# Patient Record
Sex: Male | Born: 1955 | Race: Black or African American | Hispanic: No | Marital: Single | Smoking: Former smoker
Health system: Southern US, Community
[De-identification: ages and names within clinical notes are randomized; demographics above are authoritative.]

## PROBLEM LIST (undated history)

## (undated) DIAGNOSIS — F1411 Cocaine abuse, in remission: Secondary | ICD-10-CM

## (undated) DIAGNOSIS — Z8719 Personal history of other diseases of the digestive system: Secondary | ICD-10-CM

## (undated) DIAGNOSIS — I499 Cardiac arrhythmia, unspecified: Secondary | ICD-10-CM

## (undated) DIAGNOSIS — I428 Other cardiomyopathies: Secondary | ICD-10-CM

## (undated) DIAGNOSIS — M48061 Spinal stenosis, lumbar region without neurogenic claudication: Secondary | ICD-10-CM

## (undated) DIAGNOSIS — B1920 Unspecified viral hepatitis C without hepatic coma: Secondary | ICD-10-CM

## (undated) DIAGNOSIS — F250 Schizoaffective disorder, bipolar type: Secondary | ICD-10-CM

## (undated) DIAGNOSIS — M6282 Rhabdomyolysis: Secondary | ICD-10-CM

## (undated) DIAGNOSIS — F259 Schizoaffective disorder, unspecified: Secondary | ICD-10-CM

## (undated) DIAGNOSIS — Z8701 Personal history of pneumonia (recurrent): Secondary | ICD-10-CM

## (undated) DIAGNOSIS — I639 Cerebral infarction, unspecified: Secondary | ICD-10-CM

## (undated) DIAGNOSIS — D734 Cyst of spleen: Secondary | ICD-10-CM

## (undated) DIAGNOSIS — N182 Chronic kidney disease, stage 2 (mild): Secondary | ICD-10-CM

## (undated) DIAGNOSIS — E119 Type 2 diabetes mellitus without complications: Secondary | ICD-10-CM

## (undated) DIAGNOSIS — E785 Hyperlipidemia, unspecified: Secondary | ICD-10-CM

## (undated) DIAGNOSIS — I5042 Chronic combined systolic (congestive) and diastolic (congestive) heart failure: Secondary | ICD-10-CM

## (undated) DIAGNOSIS — I4891 Unspecified atrial fibrillation: Secondary | ICD-10-CM

## (undated) DIAGNOSIS — E781 Pure hyperglyceridemia: Secondary | ICD-10-CM

## (undated) DIAGNOSIS — Q211 Atrial septal defect: Secondary | ICD-10-CM

## (undated) DIAGNOSIS — F1020 Alcohol dependence, uncomplicated: Secondary | ICD-10-CM

## (undated) DIAGNOSIS — C801 Malignant (primary) neoplasm, unspecified: Secondary | ICD-10-CM

## (undated) DIAGNOSIS — K529 Noninfective gastroenteritis and colitis, unspecified: Secondary | ICD-10-CM

## (undated) DIAGNOSIS — K219 Gastro-esophageal reflux disease without esophagitis: Secondary | ICD-10-CM

## (undated) DIAGNOSIS — Z8489 Family history of other specified conditions: Secondary | ICD-10-CM

## (undated) DIAGNOSIS — K760 Fatty (change of) liver, not elsewhere classified: Secondary | ICD-10-CM

## (undated) DIAGNOSIS — I10 Essential (primary) hypertension: Secondary | ICD-10-CM

## (undated) DIAGNOSIS — D7582 Heparin induced thrombocytopenia (HIT): Secondary | ICD-10-CM

## (undated) DIAGNOSIS — D75829 Heparin-induced thrombocytopenia, unspecified: Secondary | ICD-10-CM

## (undated) DIAGNOSIS — IMO0001 Reserved for inherently not codable concepts without codable children: Secondary | ICD-10-CM

## (undated) HISTORY — PX: KNEE ARTHROSCOPY: SHX127

## (undated) HISTORY — PX: CATARACT EXTRACTION W/ INTRAOCULAR LENS IMPLANT: SHX1309

## (undated) HISTORY — PX: LUMBAR LAMINECTOMY: SHX95

## (undated) HISTORY — DX: Other cardiomyopathies: I42.8

## (undated) HISTORY — PX: TONSILLECTOMY: SUR1361

## (undated) HISTORY — PX: BACK SURGERY: SHX140

## (undated) HISTORY — DX: Hyperlipidemia, unspecified: E78.5

---

## 1997-05-01 ENCOUNTER — Emergency Department (HOSPITAL_COMMUNITY): Admission: EM | Admit: 1997-05-01 | Discharge: 1997-05-01 | Payer: Self-pay | Admitting: Emergency Medicine

## 1997-05-12 ENCOUNTER — Emergency Department (HOSPITAL_COMMUNITY): Admission: EM | Admit: 1997-05-12 | Discharge: 1997-05-12 | Payer: Self-pay | Admitting: Emergency Medicine

## 1997-06-22 ENCOUNTER — Emergency Department (HOSPITAL_COMMUNITY): Admission: EM | Admit: 1997-06-22 | Discharge: 1997-06-22 | Payer: Self-pay | Admitting: Emergency Medicine

## 1997-06-28 ENCOUNTER — Emergency Department (HOSPITAL_COMMUNITY): Admission: EM | Admit: 1997-06-28 | Discharge: 1997-06-28 | Payer: Self-pay | Admitting: Emergency Medicine

## 1997-07-03 ENCOUNTER — Emergency Department (HOSPITAL_COMMUNITY): Admission: EM | Admit: 1997-07-03 | Discharge: 1997-07-03 | Payer: Self-pay | Admitting: Emergency Medicine

## 1997-07-16 ENCOUNTER — Emergency Department (HOSPITAL_COMMUNITY): Admission: EM | Admit: 1997-07-16 | Discharge: 1997-07-16 | Payer: Self-pay | Admitting: Emergency Medicine

## 1997-08-04 ENCOUNTER — Emergency Department (HOSPITAL_COMMUNITY): Admission: EM | Admit: 1997-08-04 | Discharge: 1997-08-04 | Payer: Self-pay | Admitting: Emergency Medicine

## 1998-09-07 ENCOUNTER — Encounter: Payer: Self-pay | Admitting: Emergency Medicine

## 1998-09-07 ENCOUNTER — Emergency Department (HOSPITAL_COMMUNITY): Admission: EM | Admit: 1998-09-07 | Discharge: 1998-09-07 | Payer: Self-pay | Admitting: Emergency Medicine

## 1998-10-01 ENCOUNTER — Emergency Department (HOSPITAL_COMMUNITY): Admission: EM | Admit: 1998-10-01 | Discharge: 1998-10-01 | Payer: Self-pay | Admitting: Emergency Medicine

## 1998-11-20 ENCOUNTER — Emergency Department (HOSPITAL_COMMUNITY): Admission: EM | Admit: 1998-11-20 | Discharge: 1998-11-20 | Payer: Self-pay | Admitting: Emergency Medicine

## 2000-09-25 ENCOUNTER — Emergency Department (HOSPITAL_COMMUNITY): Admission: EM | Admit: 2000-09-25 | Discharge: 2000-09-26 | Payer: Self-pay | Admitting: Emergency Medicine

## 2000-10-02 ENCOUNTER — Emergency Department (HOSPITAL_COMMUNITY): Admission: EM | Admit: 2000-10-02 | Discharge: 2000-10-02 | Payer: Self-pay | Admitting: Emergency Medicine

## 2000-10-03 ENCOUNTER — Encounter: Payer: Self-pay | Admitting: Emergency Medicine

## 2001-01-28 ENCOUNTER — Emergency Department (HOSPITAL_COMMUNITY): Admission: EM | Admit: 2001-01-28 | Discharge: 2001-01-28 | Payer: Self-pay | Admitting: Emergency Medicine

## 2001-01-29 ENCOUNTER — Emergency Department (HOSPITAL_COMMUNITY): Admission: EM | Admit: 2001-01-29 | Discharge: 2001-01-29 | Payer: Self-pay | Admitting: Emergency Medicine

## 2001-01-30 ENCOUNTER — Encounter: Payer: Self-pay | Admitting: *Deleted

## 2001-01-30 ENCOUNTER — Emergency Department (HOSPITAL_COMMUNITY): Admission: AC | Admit: 2001-01-30 | Discharge: 2001-01-30 | Payer: Self-pay

## 2001-02-05 ENCOUNTER — Emergency Department (HOSPITAL_COMMUNITY): Admission: EM | Admit: 2001-02-05 | Discharge: 2001-02-05 | Payer: Self-pay | Admitting: Emergency Medicine

## 2001-04-15 ENCOUNTER — Emergency Department (HOSPITAL_COMMUNITY): Admission: EM | Admit: 2001-04-15 | Discharge: 2001-04-15 | Payer: Self-pay | Admitting: Emergency Medicine

## 2001-04-17 ENCOUNTER — Emergency Department (HOSPITAL_COMMUNITY): Admission: EM | Admit: 2001-04-17 | Discharge: 2001-04-18 | Payer: Self-pay | Admitting: Emergency Medicine

## 2001-04-17 ENCOUNTER — Emergency Department (HOSPITAL_COMMUNITY): Admission: EM | Admit: 2001-04-17 | Discharge: 2001-04-17 | Payer: Self-pay | Admitting: *Deleted

## 2001-04-18 ENCOUNTER — Encounter: Payer: Self-pay | Admitting: Emergency Medicine

## 2001-06-16 ENCOUNTER — Emergency Department (HOSPITAL_COMMUNITY): Admission: EM | Admit: 2001-06-16 | Discharge: 2001-06-16 | Payer: Self-pay

## 2001-06-24 ENCOUNTER — Emergency Department (HOSPITAL_COMMUNITY): Admission: EM | Admit: 2001-06-24 | Discharge: 2001-06-24 | Payer: Self-pay | Admitting: Emergency Medicine

## 2002-01-02 ENCOUNTER — Encounter: Payer: Self-pay | Admitting: Emergency Medicine

## 2002-01-02 ENCOUNTER — Emergency Department (HOSPITAL_COMMUNITY): Admission: EM | Admit: 2002-01-02 | Discharge: 2002-01-03 | Payer: Self-pay | Admitting: Emergency Medicine

## 2002-01-03 ENCOUNTER — Emergency Department (HOSPITAL_COMMUNITY): Admission: EM | Admit: 2002-01-03 | Discharge: 2002-01-03 | Payer: Self-pay | Admitting: Emergency Medicine

## 2002-01-16 ENCOUNTER — Emergency Department (HOSPITAL_COMMUNITY): Admission: EM | Admit: 2002-01-16 | Discharge: 2002-01-16 | Payer: Self-pay | Admitting: Emergency Medicine

## 2002-01-22 ENCOUNTER — Emergency Department (HOSPITAL_COMMUNITY): Admission: EM | Admit: 2002-01-22 | Discharge: 2002-01-22 | Payer: Self-pay | Admitting: Emergency Medicine

## 2003-11-25 ENCOUNTER — Emergency Department (HOSPITAL_COMMUNITY): Admission: EM | Admit: 2003-11-25 | Discharge: 2003-11-25 | Payer: Self-pay | Admitting: Emergency Medicine

## 2005-01-26 HISTORY — PX: ACHILLES TENDON REPAIR: SUR1153

## 2005-07-22 ENCOUNTER — Emergency Department (HOSPITAL_COMMUNITY): Admission: EM | Admit: 2005-07-22 | Discharge: 2005-07-22 | Payer: Self-pay | Admitting: Emergency Medicine

## 2005-07-23 ENCOUNTER — Emergency Department (HOSPITAL_COMMUNITY): Admission: EM | Admit: 2005-07-23 | Discharge: 2005-07-23 | Payer: Self-pay | Admitting: Emergency Medicine

## 2005-08-05 ENCOUNTER — Emergency Department (HOSPITAL_COMMUNITY): Admission: EM | Admit: 2005-08-05 | Discharge: 2005-08-05 | Payer: Self-pay | Admitting: Emergency Medicine

## 2005-08-24 ENCOUNTER — Emergency Department (HOSPITAL_COMMUNITY): Admission: EM | Admit: 2005-08-24 | Discharge: 2005-08-24 | Payer: Self-pay | Admitting: Emergency Medicine

## 2005-08-29 ENCOUNTER — Emergency Department (HOSPITAL_COMMUNITY): Admission: EM | Admit: 2005-08-29 | Discharge: 2005-08-30 | Payer: Self-pay | Admitting: Emergency Medicine

## 2005-09-01 ENCOUNTER — Ambulatory Visit (HOSPITAL_COMMUNITY): Admission: RE | Admit: 2005-09-01 | Discharge: 2005-09-01 | Payer: Self-pay | Admitting: Emergency Medicine

## 2005-09-03 ENCOUNTER — Encounter (INDEPENDENT_AMBULATORY_CARE_PROVIDER_SITE_OTHER): Payer: Self-pay | Admitting: Cardiology

## 2005-09-03 ENCOUNTER — Inpatient Hospital Stay (HOSPITAL_COMMUNITY): Admission: EM | Admit: 2005-09-03 | Discharge: 2005-09-06 | Payer: Self-pay | Admitting: Emergency Medicine

## 2005-09-21 ENCOUNTER — Encounter: Admission: RE | Admit: 2005-09-21 | Discharge: 2005-09-21 | Payer: Self-pay | Admitting: Orthopedic Surgery

## 2005-09-23 ENCOUNTER — Ambulatory Visit (HOSPITAL_BASED_OUTPATIENT_CLINIC_OR_DEPARTMENT_OTHER): Admission: RE | Admit: 2005-09-23 | Discharge: 2005-09-23 | Payer: Self-pay | Admitting: Orthopedic Surgery

## 2005-12-21 ENCOUNTER — Emergency Department (HOSPITAL_COMMUNITY): Admission: EM | Admit: 2005-12-21 | Discharge: 2005-12-21 | Payer: Self-pay | Admitting: Emergency Medicine

## 2006-01-06 ENCOUNTER — Ambulatory Visit: Payer: Self-pay | Admitting: Nurse Practitioner

## 2006-02-10 ENCOUNTER — Emergency Department (HOSPITAL_COMMUNITY): Admission: EM | Admit: 2006-02-10 | Discharge: 2006-02-10 | Payer: Self-pay | Admitting: Emergency Medicine

## 2006-04-12 ENCOUNTER — Ambulatory Visit: Payer: Self-pay | Admitting: Internal Medicine

## 2006-08-01 ENCOUNTER — Emergency Department (HOSPITAL_COMMUNITY): Admission: EM | Admit: 2006-08-01 | Discharge: 2006-08-01 | Payer: Self-pay | Admitting: Emergency Medicine

## 2006-09-03 ENCOUNTER — Emergency Department (HOSPITAL_COMMUNITY): Admission: EM | Admit: 2006-09-03 | Discharge: 2006-09-03 | Payer: Self-pay | Admitting: Emergency Medicine

## 2006-10-12 ENCOUNTER — Telehealth (INDEPENDENT_AMBULATORY_CARE_PROVIDER_SITE_OTHER): Payer: Self-pay | Admitting: Internal Medicine

## 2006-10-15 ENCOUNTER — Emergency Department (HOSPITAL_COMMUNITY): Admission: EM | Admit: 2006-10-15 | Discharge: 2006-10-15 | Payer: Self-pay | Admitting: Emergency Medicine

## 2006-10-18 DIAGNOSIS — F209 Schizophrenia, unspecified: Secondary | ICD-10-CM | POA: Insufficient documentation

## 2006-10-18 DIAGNOSIS — K219 Gastro-esophageal reflux disease without esophagitis: Secondary | ICD-10-CM

## 2006-12-10 ENCOUNTER — Emergency Department (HOSPITAL_COMMUNITY): Admission: EM | Admit: 2006-12-10 | Discharge: 2006-12-10 | Payer: Self-pay | Admitting: Emergency Medicine

## 2007-01-29 ENCOUNTER — Emergency Department (HOSPITAL_COMMUNITY): Admission: EM | Admit: 2007-01-29 | Discharge: 2007-01-29 | Payer: Self-pay | Admitting: Emergency Medicine

## 2007-01-31 ENCOUNTER — Emergency Department (HOSPITAL_COMMUNITY): Admission: EM | Admit: 2007-01-31 | Discharge: 2007-01-31 | Payer: Self-pay | Admitting: Emergency Medicine

## 2007-02-03 ENCOUNTER — Emergency Department (HOSPITAL_COMMUNITY): Admission: EM | Admit: 2007-02-03 | Discharge: 2007-02-03 | Payer: Self-pay | Admitting: Emergency Medicine

## 2007-02-22 ENCOUNTER — Ambulatory Visit: Payer: Self-pay | Admitting: Nurse Practitioner

## 2007-02-22 DIAGNOSIS — M19019 Primary osteoarthritis, unspecified shoulder: Secondary | ICD-10-CM | POA: Insufficient documentation

## 2007-03-08 ENCOUNTER — Ambulatory Visit: Payer: Self-pay | Admitting: Nurse Practitioner

## 2007-03-08 DIAGNOSIS — G4700 Insomnia, unspecified: Secondary | ICD-10-CM | POA: Insufficient documentation

## 2007-05-04 ENCOUNTER — Emergency Department (HOSPITAL_COMMUNITY): Admission: EM | Admit: 2007-05-04 | Discharge: 2007-05-04 | Payer: Self-pay | Admitting: Emergency Medicine

## 2007-05-06 ENCOUNTER — Inpatient Hospital Stay (HOSPITAL_COMMUNITY): Admission: EM | Admit: 2007-05-06 | Discharge: 2007-05-10 | Payer: Self-pay | Admitting: Emergency Medicine

## 2007-05-06 ENCOUNTER — Ambulatory Visit: Payer: Self-pay | Admitting: Infectious Diseases

## 2007-05-06 DIAGNOSIS — B1710 Acute hepatitis C without hepatic coma: Secondary | ICD-10-CM

## 2007-05-06 DIAGNOSIS — E785 Hyperlipidemia, unspecified: Secondary | ICD-10-CM

## 2007-05-06 DIAGNOSIS — F1910 Other psychoactive substance abuse, uncomplicated: Secondary | ICD-10-CM

## 2007-05-06 DIAGNOSIS — D7582 Heparin induced thrombocytopenia (HIT): Secondary | ICD-10-CM

## 2007-05-06 DIAGNOSIS — Z8719 Personal history of other diseases of the digestive system: Secondary | ICD-10-CM | POA: Insufficient documentation

## 2007-05-06 LAB — CONVERTED CEMR LAB
Cholesterol: 255 mg/dL
HDL: 45 mg/dL

## 2007-05-17 ENCOUNTER — Ambulatory Visit: Payer: Self-pay | Admitting: Nurse Practitioner

## 2007-05-18 ENCOUNTER — Encounter (INDEPENDENT_AMBULATORY_CARE_PROVIDER_SITE_OTHER): Payer: Self-pay | Admitting: Nurse Practitioner

## 2007-05-18 LAB — CONVERTED CEMR LAB
ALT: 50 units/L (ref 0–53)
AST: 27 units/L (ref 0–37)
Alkaline Phosphatase: 71 units/L (ref 39–117)
BUN: 15 mg/dL (ref 6–23)
Basophils Absolute: 0 10*3/uL (ref 0.0–0.1)
Creatinine, Ser: 1.07 mg/dL (ref 0.40–1.50)
Eosinophils Absolute: 0.1 10*3/uL (ref 0.0–0.7)
Eosinophils Relative: 1 % (ref 0–5)
HCT: 42.2 % (ref 39.0–52.0)
INR: 1 (ref 0.0–1.5)
MCV: 101 fL — ABNORMAL HIGH (ref 78.0–100.0)
Neutrophils Relative %: 74 % (ref 43–77)
Platelets: 416 10*3/uL — ABNORMAL HIGH (ref 150–400)
Prothrombin Time: 13.1 s (ref 11.6–15.2)
RDW: 14.3 % (ref 11.5–15.5)
Total Bilirubin: 0.3 mg/dL (ref 0.3–1.2)
WBC: 7.9 10*3/uL (ref 4.0–10.5)

## 2007-05-19 ENCOUNTER — Telehealth (INDEPENDENT_AMBULATORY_CARE_PROVIDER_SITE_OTHER): Payer: Self-pay | Admitting: Nurse Practitioner

## 2007-06-15 ENCOUNTER — Telehealth (INDEPENDENT_AMBULATORY_CARE_PROVIDER_SITE_OTHER): Payer: Self-pay | Admitting: Nurse Practitioner

## 2007-07-05 ENCOUNTER — Emergency Department (HOSPITAL_COMMUNITY): Admission: EM | Admit: 2007-07-05 | Discharge: 2007-07-05 | Payer: Self-pay | Admitting: Emergency Medicine

## 2007-07-21 ENCOUNTER — Emergency Department (HOSPITAL_COMMUNITY): Admission: EM | Admit: 2007-07-21 | Discharge: 2007-07-21 | Payer: Self-pay | Admitting: Emergency Medicine

## 2007-09-22 ENCOUNTER — Encounter (INDEPENDENT_AMBULATORY_CARE_PROVIDER_SITE_OTHER): Payer: Self-pay | Admitting: *Deleted

## 2007-10-02 ENCOUNTER — Emergency Department (HOSPITAL_COMMUNITY): Admission: EM | Admit: 2007-10-02 | Discharge: 2007-10-03 | Payer: Self-pay | Admitting: *Deleted

## 2007-10-26 DIAGNOSIS — M109 Gout, unspecified: Secondary | ICD-10-CM | POA: Insufficient documentation

## 2007-11-17 ENCOUNTER — Encounter (INDEPENDENT_AMBULATORY_CARE_PROVIDER_SITE_OTHER): Payer: Self-pay | Admitting: Nurse Practitioner

## 2007-12-15 ENCOUNTER — Emergency Department (HOSPITAL_COMMUNITY): Admission: EM | Admit: 2007-12-15 | Discharge: 2007-12-15 | Payer: Self-pay | Admitting: Emergency Medicine

## 2007-12-27 ENCOUNTER — Emergency Department (HOSPITAL_COMMUNITY): Admission: EM | Admit: 2007-12-27 | Discharge: 2007-12-27 | Payer: Self-pay | Admitting: Emergency Medicine

## 2008-01-21 ENCOUNTER — Emergency Department (HOSPITAL_COMMUNITY): Admission: EM | Admit: 2008-01-21 | Discharge: 2008-01-22 | Payer: Self-pay | Admitting: Emergency Medicine

## 2008-02-08 ENCOUNTER — Emergency Department (HOSPITAL_COMMUNITY): Admission: EM | Admit: 2008-02-08 | Discharge: 2008-02-08 | Payer: Self-pay | Admitting: Family Medicine

## 2008-02-22 ENCOUNTER — Emergency Department (HOSPITAL_COMMUNITY): Admission: EM | Admit: 2008-02-22 | Discharge: 2008-02-22 | Payer: Self-pay | Admitting: Emergency Medicine

## 2008-05-14 ENCOUNTER — Emergency Department (HOSPITAL_COMMUNITY): Admission: EM | Admit: 2008-05-14 | Discharge: 2008-05-15 | Payer: Self-pay | Admitting: Emergency Medicine

## 2008-05-15 ENCOUNTER — Observation Stay (HOSPITAL_COMMUNITY): Admission: RE | Admit: 2008-05-15 | Discharge: 2008-05-18 | Payer: Self-pay | Admitting: Psychiatry

## 2008-05-15 ENCOUNTER — Ambulatory Visit: Payer: Self-pay | Admitting: Psychiatry

## 2008-07-03 ENCOUNTER — Encounter (INDEPENDENT_AMBULATORY_CARE_PROVIDER_SITE_OTHER): Payer: Self-pay | Admitting: Internal Medicine

## 2008-08-01 ENCOUNTER — Encounter (INDEPENDENT_AMBULATORY_CARE_PROVIDER_SITE_OTHER): Payer: Self-pay | Admitting: Nurse Practitioner

## 2008-08-03 ENCOUNTER — Encounter (INDEPENDENT_AMBULATORY_CARE_PROVIDER_SITE_OTHER): Payer: Self-pay | Admitting: Nurse Practitioner

## 2008-09-04 ENCOUNTER — Encounter: Admission: RE | Admit: 2008-09-04 | Discharge: 2008-09-04 | Payer: Self-pay | Admitting: Family Medicine

## 2009-01-22 ENCOUNTER — Emergency Department (HOSPITAL_COMMUNITY): Admission: EM | Admit: 2009-01-22 | Discharge: 2009-01-22 | Payer: Self-pay | Admitting: Emergency Medicine

## 2009-01-31 ENCOUNTER — Emergency Department (HOSPITAL_COMMUNITY): Admission: EM | Admit: 2009-01-31 | Discharge: 2009-01-31 | Payer: Self-pay | Admitting: Emergency Medicine

## 2009-02-13 ENCOUNTER — Encounter: Admission: RE | Admit: 2009-02-13 | Discharge: 2009-02-13 | Payer: Self-pay | Admitting: Family Medicine

## 2009-05-26 DIAGNOSIS — K529 Noninfective gastroenteritis and colitis, unspecified: Secondary | ICD-10-CM

## 2009-05-26 HISTORY — DX: Noninfective gastroenteritis and colitis, unspecified: K52.9

## 2009-06-01 ENCOUNTER — Emergency Department (HOSPITAL_COMMUNITY): Admission: EM | Admit: 2009-06-01 | Discharge: 2009-06-01 | Payer: Self-pay | Admitting: Emergency Medicine

## 2009-06-24 ENCOUNTER — Emergency Department (HOSPITAL_COMMUNITY): Admission: EM | Admit: 2009-06-24 | Discharge: 2009-06-25 | Payer: Self-pay | Admitting: Emergency Medicine

## 2009-08-15 ENCOUNTER — Emergency Department (HOSPITAL_COMMUNITY): Admission: EM | Admit: 2009-08-15 | Discharge: 2009-08-15 | Payer: Self-pay | Admitting: Emergency Medicine

## 2009-10-08 ENCOUNTER — Emergency Department (HOSPITAL_COMMUNITY): Admission: EM | Admit: 2009-10-08 | Discharge: 2009-10-09 | Payer: Self-pay | Admitting: Emergency Medicine

## 2009-12-17 ENCOUNTER — Ambulatory Visit (HOSPITAL_COMMUNITY): Admission: RE | Admit: 2009-12-17 | Discharge: 2009-12-17 | Payer: Self-pay | Admitting: Family Medicine

## 2010-01-15 ENCOUNTER — Emergency Department (HOSPITAL_COMMUNITY)
Admission: EM | Admit: 2010-01-15 | Discharge: 2010-01-15 | Payer: Self-pay | Source: Home / Self Care | Admitting: Emergency Medicine

## 2010-01-20 ENCOUNTER — Emergency Department (HOSPITAL_COMMUNITY)
Admission: EM | Admit: 2010-01-20 | Discharge: 2010-01-20 | Payer: Self-pay | Source: Home / Self Care | Admitting: Emergency Medicine

## 2010-02-13 ENCOUNTER — Inpatient Hospital Stay: Admission: RE | Admit: 2010-02-13 | Payer: Self-pay | Source: Home / Self Care | Admitting: Orthopedic Surgery

## 2010-02-21 ENCOUNTER — Encounter
Admission: RE | Admit: 2010-02-21 | Discharge: 2010-02-21 | Payer: Self-pay | Source: Home / Self Care | Attending: Family Medicine | Admitting: Family Medicine

## 2010-04-01 ENCOUNTER — Encounter (HOSPITAL_COMMUNITY): Payer: Self-pay

## 2010-04-01 ENCOUNTER — Emergency Department (HOSPITAL_COMMUNITY): Payer: PRIVATE HEALTH INSURANCE

## 2010-04-01 ENCOUNTER — Emergency Department (HOSPITAL_COMMUNITY)
Admission: EM | Admit: 2010-04-01 | Discharge: 2010-04-01 | Disposition: A | Discharge status: Home / Self Care | Payer: PRIVATE HEALTH INSURANCE | Attending: Emergency Medicine | Admitting: Emergency Medicine

## 2010-04-01 DIAGNOSIS — K219 Gastro-esophageal reflux disease without esophagitis: Secondary | ICD-10-CM | POA: Insufficient documentation

## 2010-04-01 DIAGNOSIS — R079 Chest pain, unspecified: Secondary | ICD-10-CM | POA: Insufficient documentation

## 2010-04-01 DIAGNOSIS — I10 Essential (primary) hypertension: Secondary | ICD-10-CM | POA: Insufficient documentation

## 2010-04-01 DIAGNOSIS — R109 Unspecified abdominal pain: Secondary | ICD-10-CM | POA: Insufficient documentation

## 2010-04-01 DIAGNOSIS — Z8659 Personal history of other mental and behavioral disorders: Secondary | ICD-10-CM | POA: Insufficient documentation

## 2010-04-01 DIAGNOSIS — Z79899 Other long term (current) drug therapy: Secondary | ICD-10-CM | POA: Insufficient documentation

## 2010-04-01 DIAGNOSIS — R630 Anorexia: Secondary | ICD-10-CM | POA: Insufficient documentation

## 2010-04-01 DIAGNOSIS — M546 Pain in thoracic spine: Secondary | ICD-10-CM | POA: Insufficient documentation

## 2010-04-01 HISTORY — DX: Noninfective gastroenteritis and colitis, unspecified: K52.9

## 2010-04-01 HISTORY — DX: Schizoaffective disorder, bipolar type: F25.0

## 2010-04-01 HISTORY — DX: Schizoaffective disorder, unspecified: F25.9

## 2010-04-01 HISTORY — DX: Essential (primary) hypertension: I10

## 2010-04-01 HISTORY — DX: Gastro-esophageal reflux disease without esophagitis: K21.9

## 2010-04-01 LAB — COMPREHENSIVE METABOLIC PANEL
ALT: 90 U/L — ABNORMAL HIGH (ref 0–53)
AST: 146 U/L — ABNORMAL HIGH (ref 0–37)
CO2: 22 mEq/L (ref 19–32)
Calcium: 8.4 mg/dL (ref 8.4–10.5)
GFR calc Af Amer: >60 mL/min (ref >60)
GFR calc non Af Amer: >60 mL/min (ref >60)
Sodium: 134 mEq/L — ABNORMAL LOW (ref 135–145)

## 2010-04-01 LAB — POCT CARDIAC MARKERS
CKMB, poc: 2.6 ng/mL (ref 1.0–8.0)
Myoglobin, poc: 54.9 ng/mL (ref 12–200)
Troponin i, poc: <0.05 ng/mL (ref 0.00–0.09)

## 2010-04-01 LAB — URINALYSIS, ROUTINE W REFLEX MICROSCOPIC
Bilirubin Urine: NEGATIVE
Ketones, ur: NEGATIVE mg/dL
Nitrite: NEGATIVE
Protein, ur: 30 mg/dL — AB
pH: 5.5 (ref 5.0–8.0)

## 2010-04-01 LAB — COMPREHENSIVE METABOLIC PANEL WITH GFR
Albumin: 3.4 g/dL — ABNORMAL LOW (ref 3.5–5.2)
Alkaline Phosphatase: 100 U/L (ref 39–117)
BUN: 11 mg/dL (ref 6–23)
Chloride: 105 meq/L (ref 96–112)
Creatinine, Ser: 1.01 mg/dL (ref 0.4–1.5)
Glucose, Bld: 118 mg/dL — ABNORMAL HIGH (ref 70–99)
Potassium: 5 meq/L (ref 3.5–5.1)
Total Bilirubin: 2.1 mg/dL — ABNORMAL HIGH (ref 0.3–1.2)
Total Protein: 6.4 g/dL (ref 6.0–8.3)

## 2010-04-01 LAB — DIFFERENTIAL
Basophils Absolute: 0 10*3/uL (ref 0.0–0.1)
Basophils Relative: 0 % (ref 0–1)
Monocytes Absolute: 0.3 10*3/uL (ref 0.1–1.0)
Neutro Abs: 4.3 10*3/uL (ref 1.7–7.7)
Neutrophils Relative %: 78 % — ABNORMAL HIGH (ref 43–77)

## 2010-04-01 LAB — URINE MICROSCOPIC-ADD ON

## 2010-04-01 LAB — CBC
Hemoglobin: 14.2 g/dL (ref 13.0–17.0)
MCHC: 32.5 g/dL (ref 30.0–36.0)
RBC: 4.84 MIL/uL (ref 4.22–5.81)
WBC: 5.5 10*3/uL (ref 4.0–10.5)

## 2010-04-01 LAB — LIPASE, BLOOD: Lipase: 61 U/L — ABNORMAL HIGH (ref 11–59)

## 2010-04-01 MED ORDER — IOHEXOL 300 MG/ML  SOLN
100.0000 mL | Freq: Once | INTRAMUSCULAR | Status: AC | PRN
  Administered 2010-04-01: 100 mL via INTRAVENOUS

## 2010-04-02 ENCOUNTER — Other Ambulatory Visit (HOSPITAL_COMMUNITY): Payer: Self-pay | Admitting: Interventional Cardiology

## 2010-04-02 DIAGNOSIS — R0989 Other specified symptoms and signs involving the circulatory and respiratory systems: Secondary | ICD-10-CM

## 2010-04-02 DIAGNOSIS — R9431 Abnormal electrocardiogram [ECG] [EKG]: Secondary | ICD-10-CM

## 2010-04-02 DIAGNOSIS — R0609 Other forms of dyspnea: Secondary | ICD-10-CM

## 2010-04-02 DIAGNOSIS — Z01810 Encounter for preprocedural cardiovascular examination: Secondary | ICD-10-CM

## 2010-04-07 ENCOUNTER — Ambulatory Visit (HOSPITAL_COMMUNITY): Payer: Medicare Other

## 2010-04-07 ENCOUNTER — Ambulatory Visit (HOSPITAL_COMMUNITY)
Admission: RE | Admit: 2010-04-07 | Discharge: 2010-04-07 | Disposition: A | Discharge status: Home / Self Care | Payer: PRIVATE HEALTH INSURANCE | Source: Ambulatory Visit | Attending: Interventional Cardiology | Admitting: Interventional Cardiology

## 2010-04-07 DIAGNOSIS — R0609 Other forms of dyspnea: Secondary | ICD-10-CM

## 2010-04-07 DIAGNOSIS — Z01810 Encounter for preprocedural cardiovascular examination: Secondary | ICD-10-CM

## 2010-04-07 DIAGNOSIS — R079 Chest pain, unspecified: Secondary | ICD-10-CM | POA: Insufficient documentation

## 2010-04-07 DIAGNOSIS — I10 Essential (primary) hypertension: Secondary | ICD-10-CM | POA: Insufficient documentation

## 2010-04-07 DIAGNOSIS — R0602 Shortness of breath: Secondary | ICD-10-CM | POA: Insufficient documentation

## 2010-04-07 DIAGNOSIS — R9431 Abnormal electrocardiogram [ECG] [EKG]: Secondary | ICD-10-CM

## 2010-04-07 DIAGNOSIS — R0989 Other specified symptoms and signs involving the circulatory and respiratory systems: Secondary | ICD-10-CM

## 2010-04-07 LAB — URINALYSIS, ROUTINE W REFLEX MICROSCOPIC
Bilirubin Urine: NEGATIVE
Hgb urine dipstick: NEGATIVE
Specific Gravity, Urine: 1.03 (ref 1.005–1.030)
Urobilinogen, UA: 1 mg/dL (ref 0.0–1.0)
pH: 5.5 (ref 5.0–8.0)

## 2010-04-07 LAB — URINE MICROSCOPIC-ADD ON

## 2010-04-07 LAB — COMPREHENSIVE METABOLIC PANEL
ALT: 39 U/L (ref 0–53)
Albumin: 3.8 g/dL (ref 3.5–5.2)
Alkaline Phosphatase: 86 U/L (ref 39–117)
GFR calc Af Amer: >60 mL/min (ref >60)
Potassium: 3.9 mEq/L (ref 3.5–5.1)
Sodium: 136 mEq/L (ref 135–145)
Total Protein: 7.4 g/dL (ref 6.0–8.3)

## 2010-04-07 LAB — DIFFERENTIAL
Basophils Relative: 0 % (ref 0–1)
Eosinophils Absolute: 0.1 10*3/uL (ref 0.0–0.7)
Lymphs Abs: 0.7 10*3/uL (ref 0.7–4.0)
Monocytes Absolute: 0.8 10*3/uL (ref 0.1–1.0)
Monocytes Relative: 8 % (ref 3–12)

## 2010-04-07 LAB — CBC
Platelets: 165 10*3/uL (ref 150–400)
RDW: 13.1 % (ref 11.5–15.5)
WBC: 10.1 10*3/uL (ref 4.0–10.5)

## 2010-04-07 MED ORDER — TECHNETIUM TC 99M TETROFOSMIN IV KIT
30.0000 | PACK | Freq: Once | INTRAVENOUS | Status: AC | PRN
  Administered 2010-04-07: 30 via INTRAVENOUS

## 2010-04-07 MED ORDER — TECHNETIUM TC 99M TETROFOSMIN IV KIT
10.0000 | PACK | Freq: Once | INTRAVENOUS | Status: AC | PRN
  Administered 2010-04-07: 10 via INTRAVENOUS

## 2010-04-08 LAB — CBC
HCT: 46.4 % (ref 39.0–52.0)
MCH: 30.2 pg (ref 26.0–34.0)
MCV: 89.9 fL (ref 78.0–100.0)
Platelets: 205 10*3/uL (ref 150–400)
RBC: 5.16 MIL/uL (ref 4.22–5.81)

## 2010-04-08 LAB — LIPID PANEL
Cholesterol: 187 mg/dL (ref 0–200)
LDL Cholesterol: 87 mg/dL (ref 0–99)
Triglycerides: 293 mg/dL — ABNORMAL HIGH (ref <150)
VLDL: 59 mg/dL — ABNORMAL HIGH (ref 0–40)

## 2010-04-08 LAB — COMPREHENSIVE METABOLIC PANEL
Albumin: 3.9 g/dL (ref 3.5–5.2)
Alkaline Phosphatase: 80 U/L (ref 39–117)
BUN: 9 mg/dL (ref 6–23)
Chloride: 106 mEq/L (ref 96–112)
Creatinine, Ser: 1.07 mg/dL (ref 0.4–1.5)
GFR calc non Af Amer: >60 mL/min (ref >60)
Glucose, Bld: 109 mg/dL — ABNORMAL HIGH (ref 70–99)
Total Bilirubin: 0.7 mg/dL (ref 0.3–1.2)

## 2010-04-12 LAB — COMPREHENSIVE METABOLIC PANEL
ALT: 190 U/L — ABNORMAL HIGH (ref 0–53)
Alkaline Phosphatase: 117 U/L (ref 39–117)
BUN: 10 mg/dL (ref 6–23)
CO2: 21 mEq/L (ref 19–32)
Chloride: 107 mEq/L (ref 96–112)
Glucose, Bld: 105 mg/dL — ABNORMAL HIGH (ref 70–99)
Potassium: 3.4 mEq/L — ABNORMAL LOW (ref 3.5–5.1)
Sodium: 134 mEq/L — ABNORMAL LOW (ref 135–145)
Total Bilirubin: 1.3 mg/dL — ABNORMAL HIGH (ref 0.3–1.2)

## 2010-04-12 LAB — DIFFERENTIAL
Basophils Absolute: 0.1 10*3/uL (ref 0.0–0.1)
Basophils Relative: 1 % (ref 0–1)
Eosinophils Absolute: 0.1 10*3/uL (ref 0.0–0.7)
Lymphocytes Relative: 22 % (ref 12–46)
Monocytes Absolute: 0.6 10*3/uL (ref 0.1–1.0)
Neutrophils Relative %: 62 % (ref 43–77)

## 2010-04-12 LAB — CBC
HCT: 42.6 % (ref 39.0–52.0)
Hemoglobin: 14.7 g/dL (ref 13.0–17.0)
MCV: 89.5 fL (ref 78.0–100.0)
RBC: 4.76 MIL/uL (ref 4.22–5.81)
WBC: 4.9 10*3/uL (ref 4.0–10.5)

## 2010-04-12 LAB — URINALYSIS, ROUTINE W REFLEX MICROSCOPIC
Glucose, UA: NEGATIVE mg/dL
Hgb urine dipstick: NEGATIVE
Ketones, ur: NEGATIVE mg/dL
Protein, ur: 100 mg/dL — AB
Urobilinogen, UA: 0.2 mg/dL (ref 0.0–1.0)

## 2010-04-12 LAB — ETHANOL: Alcohol, Ethyl (B): <5 mg/dL (ref 0–10)

## 2010-04-12 LAB — URINE MICROSCOPIC-ADD ON

## 2010-04-14 LAB — CBC
Hemoglobin: 14.4 g/dL (ref 13.0–17.0)
MCHC: 33.8 g/dL (ref 30.0–36.0)
MCV: 89.4 fL (ref 78.0–100.0)
RBC: 4.76 MIL/uL (ref 4.22–5.81)
WBC: 11.8 10*3/uL — ABNORMAL HIGH (ref 4.0–10.5)

## 2010-04-14 LAB — COMPREHENSIVE METABOLIC PANEL
ALT: 76 U/L — ABNORMAL HIGH (ref 0–53)
AST: 80 U/L — ABNORMAL HIGH (ref 0–37)
CO2: 25 mEq/L (ref 19–32)
Calcium: 9.8 mg/dL (ref 8.4–10.5)
Chloride: 108 mEq/L (ref 96–112)
Creatinine, Ser: 1.04 mg/dL (ref 0.4–1.5)
GFR calc non Af Amer: >60 mL/min (ref >60)
Glucose, Bld: 96 mg/dL (ref 70–99)
Total Bilirubin: 0.8 mg/dL (ref 0.3–1.2)

## 2010-04-14 LAB — URINALYSIS, ROUTINE W REFLEX MICROSCOPIC
Glucose, UA: NEGATIVE mg/dL
Ketones, ur: 15 mg/dL — AB
Leukocytes, UA: NEGATIVE
Nitrite: NEGATIVE
Protein, ur: 100 mg/dL — AB

## 2010-04-14 LAB — DIFFERENTIAL
Basophils Absolute: 0 10*3/uL (ref 0.0–0.1)
Eosinophils Absolute: 0.1 10*3/uL (ref 0.0–0.7)
Eosinophils Relative: 1 % (ref 0–5)
Lymphocytes Relative: 18 % (ref 12–46)
Lymphs Abs: 2.1 10*3/uL (ref 0.7–4.0)
Neutrophils Relative %: 72 % (ref 43–77)

## 2010-04-14 LAB — URINE MICROSCOPIC-ADD ON

## 2010-05-07 LAB — COMPREHENSIVE METABOLIC PANEL
ALT: 61 U/L — ABNORMAL HIGH (ref 0–53)
AST: 74 U/L — ABNORMAL HIGH (ref 0–37)
Albumin: 3.8 g/dL (ref 3.5–5.2)
Alkaline Phosphatase: 76 U/L (ref 39–117)
CO2: 23 mEq/L (ref 19–32)
Chloride: 110 mEq/L (ref 96–112)
Creatinine, Ser: 1.01 mg/dL (ref 0.4–1.5)
GFR calc Af Amer: >60 mL/min (ref >60)
GFR calc non Af Amer: >60 mL/min (ref >60)
Potassium: 4.1 mEq/L (ref 3.5–5.1)
Sodium: 141 mEq/L (ref 135–145)
Total Bilirubin: 0.4 mg/dL (ref 0.3–1.2)

## 2010-05-07 LAB — POCT I-STAT, CHEM 8
Calcium, Ion: 1.11 mmol/L — ABNORMAL LOW (ref 1.12–1.32)
Creatinine, Ser: 1.4 mg/dL (ref 0.4–1.5)
Glucose, Bld: 78 mg/dL (ref 70–99)
Hemoglobin: 15.3 g/dL (ref 13.0–17.0)
Potassium: 4 mEq/L (ref 3.5–5.1)

## 2010-05-07 LAB — LIPASE, BLOOD: Lipase: 28 U/L (ref 11–59)

## 2010-05-07 LAB — COMPREHENSIVE METABOLIC PANEL WITH GFR
BUN: 23 mg/dL (ref 6–23)
Calcium: 9 mg/dL (ref 8.4–10.5)
Glucose, Bld: 78 mg/dL (ref 70–99)
Total Protein: 6.8 g/dL (ref 6.0–8.3)

## 2010-05-07 LAB — RAPID URINE DRUG SCREEN, HOSP PERFORMED
Cocaine: NOT DETECTED
Tetrahydrocannabinol: NOT DETECTED

## 2010-05-07 LAB — ETHANOL: Alcohol, Ethyl (B): 204 mg/dL — ABNORMAL HIGH (ref 0–10)

## 2010-05-08 ENCOUNTER — Encounter (HOSPITAL_COMMUNITY)
Admission: RE | Admit: 2010-05-08 | Discharge: 2010-05-08 | Disposition: A | Discharge status: Home / Self Care | Payer: PRIVATE HEALTH INSURANCE | Source: Ambulatory Visit | Attending: Orthopedic Surgery | Admitting: Orthopedic Surgery

## 2010-05-08 LAB — CBC
HCT: 41.1 % (ref 39.0–52.0)
Platelets: 187 10*3/uL (ref 150–400)
RBC: 4.53 MIL/uL (ref 4.22–5.81)
RDW: 13.3 % (ref 11.5–15.5)
WBC: 6.2 10*3/uL (ref 4.0–10.5)

## 2010-05-08 LAB — BASIC METABOLIC PANEL
BUN: 13 mg/dL (ref 6–23)
Creatinine, Ser: 1.07 mg/dL (ref 0.4–1.5)
GFR calc Af Amer: >60 mL/min (ref >60)
GFR calc non Af Amer: >60 mL/min (ref >60)
Potassium: 4.3 mEq/L (ref 3.5–5.1)

## 2010-05-08 LAB — PROTIME-INR: INR: 0.94 (ref 0.00–1.49)

## 2010-05-08 LAB — SURGICAL PCR SCREEN: Staphylococcus aureus: NEGATIVE

## 2010-05-08 LAB — APTT: aPTT: 27 seconds (ref 24–37)

## 2010-05-12 LAB — POCT I-STAT, CHEM 8
HCT: 44 % (ref 39.0–52.0)
Hemoglobin: 15 g/dL (ref 13.0–17.0)
Potassium: 4.1 mEq/L (ref 3.5–5.1)
Sodium: 140 mEq/L (ref 135–145)

## 2010-05-12 LAB — URINALYSIS, ROUTINE W REFLEX MICROSCOPIC
Bilirubin Urine: NEGATIVE
Hgb urine dipstick: NEGATIVE
Specific Gravity, Urine: 1.021 (ref 1.005–1.030)
Urobilinogen, UA: 0.2 mg/dL (ref 0.0–1.0)

## 2010-05-12 LAB — DIFFERENTIAL
Basophils Absolute: 0 10*3/uL (ref 0.0–0.1)
Basophils Relative: 1 % (ref 0–1)
Eosinophils Absolute: 0.2 10*3/uL (ref 0.0–0.7)
Neutrophils Relative %: 46 % (ref 43–77)

## 2010-05-12 LAB — CK TOTAL AND CKMB (NOT AT ARMC)
CK, MB: 3.4 ng/mL (ref 0.3–4.0)
Relative Index: 2.3 (ref 0.0–2.5)

## 2010-05-12 LAB — D-DIMER, QUANTITATIVE: D-Dimer, Quant: 0.22 ug/mL-FEU (ref 0.00–0.48)

## 2010-05-12 LAB — URINE MICROSCOPIC-ADD ON

## 2010-05-12 LAB — CBC
MCHC: 33.4 g/dL (ref 30.0–36.0)
MCV: 92.2 fL (ref 78.0–100.0)
Platelets: 201 10*3/uL (ref 150–400)

## 2010-05-12 LAB — POCT CARDIAC MARKERS
CKMB, poc: 1.8 ng/mL (ref 1.0–8.0)
Myoglobin, poc: 53.8 ng/mL (ref 12–200)

## 2010-05-12 LAB — RAPID URINE DRUG SCREEN, HOSP PERFORMED
Barbiturates: NOT DETECTED
Benzodiazepines: NOT DETECTED

## 2010-05-12 LAB — TROPONIN I: Troponin I: 0.02 ng/mL (ref 0.00–0.06)

## 2010-05-14 ENCOUNTER — Inpatient Hospital Stay (HOSPITAL_COMMUNITY): Payer: PRIVATE HEALTH INSURANCE

## 2010-05-14 ENCOUNTER — Inpatient Hospital Stay (HOSPITAL_COMMUNITY)
Admission: RE | Admit: 2010-05-14 | Discharge: 2010-05-16 | DRG: 490 | Disposition: A | Discharge status: Home / Self Care | Payer: PRIVATE HEALTH INSURANCE | Source: Ambulatory Visit | Attending: Orthopedic Surgery | Admitting: Orthopedic Surgery

## 2010-05-14 DIAGNOSIS — I10 Essential (primary) hypertension: Secondary | ICD-10-CM | POA: Diagnosis present

## 2010-05-14 DIAGNOSIS — E785 Hyperlipidemia, unspecified: Secondary | ICD-10-CM | POA: Diagnosis present

## 2010-05-14 DIAGNOSIS — K861 Other chronic pancreatitis: Secondary | ICD-10-CM | POA: Diagnosis present

## 2010-05-14 DIAGNOSIS — F1410 Cocaine abuse, uncomplicated: Secondary | ICD-10-CM | POA: Diagnosis present

## 2010-05-14 DIAGNOSIS — Z01812 Encounter for preprocedural laboratory examination: Secondary | ICD-10-CM

## 2010-05-14 DIAGNOSIS — F17200 Nicotine dependence, unspecified, uncomplicated: Secondary | ICD-10-CM | POA: Diagnosis present

## 2010-05-14 DIAGNOSIS — B1920 Unspecified viral hepatitis C without hepatic coma: Secondary | ICD-10-CM | POA: Diagnosis present

## 2010-05-14 DIAGNOSIS — F209 Schizophrenia, unspecified: Secondary | ICD-10-CM | POA: Diagnosis present

## 2010-05-14 DIAGNOSIS — M48061 Spinal stenosis, lumbar region without neurogenic claudication: Principal | ICD-10-CM | POA: Diagnosis present

## 2010-05-14 LAB — DIFFERENTIAL
Basophils Absolute: 0.1 10*3/uL (ref 0.0–0.1)
Eosinophils Relative: 4 % (ref 0–5)
Lymphocytes Relative: 50 % — ABNORMAL HIGH (ref 12–46)
Monocytes Absolute: 0.5 10*3/uL (ref 0.1–1.0)
Monocytes Relative: 11 % (ref 3–12)
Neutro Abs: 1.5 10*3/uL — ABNORMAL LOW (ref 1.7–7.7)

## 2010-05-14 LAB — CBC
HCT: 43.7 % (ref 39.0–52.0)
Hemoglobin: 14.6 g/dL (ref 13.0–17.0)
MCH: 30.3 pg (ref 26.0–34.0)
MCHC: 33.4 g/dL (ref 30.0–36.0)
MCV: 90.7 fL (ref 78.0–100.0)
RDW: 13.3 % (ref 11.5–15.5)

## 2010-05-14 LAB — URINALYSIS, ROUTINE W REFLEX MICROSCOPIC
Bilirubin Urine: NEGATIVE
Glucose, UA: NEGATIVE mg/dL
Hgb urine dipstick: NEGATIVE
Ketones, ur: NEGATIVE mg/dL
Protein, ur: NEGATIVE mg/dL
Urobilinogen, UA: 0.2 mg/dL (ref 0.0–1.0)

## 2010-05-14 LAB — ABO/RH: ABO/RH(D): A NEG

## 2010-05-14 LAB — TYPE AND SCREEN: ABO/RH(D): A NEG

## 2010-05-17 LAB — POCT I-STAT 4, (NA,K, GLUC, HGB,HCT)
Glucose, Bld: 118 mg/dL — ABNORMAL HIGH (ref 70–99)
Glucose, Bld: 136 mg/dL — ABNORMAL HIGH (ref 70–99)
HCT: 40 % (ref 39.0–52.0)
Hemoglobin: 13.6 g/dL (ref 13.0–17.0)
Potassium: 4.2 mEq/L (ref 3.5–5.1)
Sodium: 138 mEq/L (ref 135–145)

## 2010-05-21 NOTE — Op Note (Signed)
NAME:  [name] [name], [name] [name]             ACCOUNT NO.:  [id number]  MEDICAL RECORD NO.:  [id number]           PATIENT TYPE:  I  LOCATION:  [id number]                         FACILITY:  [facility]  PHYSICIAN:  Phylliss Bob, MD      DATE OF BIRTH:  11/07/1955  DATE OF PROCEDURE:  05/14/2010 DATE OF DISCHARGE:                              OPERATIVE REPORT   PREOPERATIVE DIAGNOSIS:  Spinal stenosis.  POSTOPERATIVE DIAGNOSIS:  Spinal stenosis.  PROCEDURE:  L2-3, L3-4, L4-5 laminectomy, partial facetectomy, and bilateral foraminotomy.  SURGEON:  Phylliss Bob, MD  ASSISTANT:  None.  ANESTHESIA:  General endotracheal anesthesia.  COMPLICATIONS:  None.  DISPOSITION:  Stable.  ESTIMATED BLOOD LOSS:  650 mL.  INDICATIONS FOR PROCEDURE:  Briefly, [name] [name] is a very pleasant 55- year-old male who did present to my office with pain, mainly involving the left leg.  Of note, the patient did have conservative care at the hands of Dr. Jacelyn Grip.  He did have physical therapy and he also had epidural injections.  He did feel that the injections did give him temporary relief.  I did review an MRI which was consistent with spinal stenosis involving the L2-3, L3-4 and L4-5 levels with moderate foraminal stenosis noted at each intervertebral level as well.  Given the patient's failure of conservative care, we did have a discussion regarding going forward with a decompressive procedure.  The patient did understand the risks and limitations of the procedure as outlined in my preoperative note.  OPERATIVE DETAILS:  On May 14, 2010, the patient was brought to surgery and general endotracheal anesthesia was administered.  The patient was placed prone on a well-padded Jackson spinal frame.  All bony prominences were meticulously padded.  SCDs were placed and antibiotics were given.  Time-out procedure was performed.  I then made an incision, centered over approximately at L3-4 interspace.  I  then subperiosteally exposed  what turned out to be the lamina of L3.  I did get a lateral intraoperative radiograph to confirm the appropriate level.  I then went forward with subperiosteally exposing the lamina of L2, L3, L4, and L5.  Self-retaining cerebellar retractors were placed. I did ensure that there was no violation of the facet joints at any intervertebral level.  I then removed the spinous processes of L2, L3, and L4.  I then went forward with a central decompression in the usual manner using a series of Kerrisons.  A high-speed bur was also used in order to thin out the posterior elements.  I then continued my central decompression across the L4-5, then the L3-4, and then the L2-3 interspace.  I then went forward with a lateral recess and foraminal decompression, first on the patient's right side, again using a series of Kerrison punches.  I did use a Woodson throughout the procedure in order to develop a safe plane between the underlying dura and the hypertrophied facets and ligamentum flavum.  I ultimately was able to carry out a lateral recess and foraminal decompression across the L2-3, L3-4, and L4-5 interspaces.  At the termination of the decompression, I was easily able to pass a [facility]  out the neural foramen of each level that was decompressed.  I then turned my attention towards the patient's left side.  I did a similar decompression in the manner as described. Again, I was easily able pass a Barbra Sarks out the neural foramina at each intervertebral level.  I then turned my attention towards epidural bleeding.  I did use bipolar electrocautery to control all epidural bleeding.  This was done after placing FloSeal followed by patties.  I was very happy with the final decompression and with the final hemostasis.  I then copiously irrigated the wound with approximately 1- 1/2 liter of normal saline.  A total of 80 mg of Depo-Medrol was placed in the posterolateral gutters  on both the right and the left sides.  The wound was then closed.  Prior to closure, I did place a #15 Blake drain. The fascia was then closed using #1 Vicryl.  The subcutaneous layer was closed using 2-0 Vicryl and the skin was closed using 3-0 Monocryl.  The patient was then awoken from general endotracheal anesthesia and transferred to recovery in stable condition.  All instrument counts were correct at the termination of the procedure.     Phylliss Bob, MD     MD/MEDQ  D:  05/14/2010  T:  05/15/2010  Job:  [id number]  Electronically Signed by Elta Guadeloupe Brittain Hosie  on 05/21/2010 08:14:52 AM

## 2010-05-21 NOTE — Discharge Summary (Signed)
  NAME:  ABDULMALIK, BIESINGER             ACCOUNT NO.:  [id number]  MEDICAL RECORD NO.:  [id number]           PATIENT TYPE:  I  LOCATION:  [id number]                         FACILITY:  [facility]  PHYSICIAN:  Phylliss Bob, MD      DATE OF BIRTH:  [birth year]  DATE OF ADMISSION:  05/14/2010 DATE OF DISCHARGE:  05/16/2010                              DISCHARGE SUMMARY   ADMISSION DIAGNOSIS:  Spinal stenosis.  DISCHARGE DIAGNOSIS:  Spinal stenosis.  PROCEDURE AND DATE:  L2-L5 laminectomy, partial facetectomy, and bilateral foraminotomy on May 14, 2010.  ADMISSION HISTORY:  Mr. Lenoir is a pleasant 55 year old male who presented to my office with pain involving both the left and right legs. I did review an MRI which was consistent with spinal stenosis involving the L2-3, L3-4, and L4-5 levels.  The patient failed conservative care and was, therefore, admitted on May 14, 2010, for a decompressive procedure.  HOSPITAL COURSE:  On May 14, 2010, the patient was admitted and underwent the procedure noted above.  The patient tolerated the procedure well and was transferred to recovery in stable condition.  The patient was evaluated on postop day #1 and his radicular symptoms were significantly improved.  He did have some back discomfort.  Of particular note, he did note numbness in the distribution of the ulnar nerves bilaterally on the morning of postop day #1.  This was observed and was also present on the morning of postop day #2 but it was decreased.  Also of note, a JP drain was placed postoperatively and on the morning of postop day #1, it was noted that his JP drain put out 100 mL.  I therefore made a decision to continue to observe the patient for additional 24 hours.  On the morning of postop day #2, his JP drain had put out 40 mL over the last 8-hour shift and the JP drain was, therefore, discontinued.  The patient's dressing was noted to be clean, dry, and intact on the morning of his  discharge and he was neurovascularly intact and was tolerating a diet well and ambulating well with physical therapy.  The patient was, therefore, uneventfully discharged on postop day #2.  DISCHARGE INSTRUCTIONS:  The patient will adhere to back precautions at all times.  He will remove the dressing on postop day #5, at which time he can get the wound wet.  He will take Percocet for pain and Valium for spasms.  The patient will follow up with me at approximately 2 weeks after his procedure.     Phylliss Bob, MD     MD/MEDQ  D:  05/16/2010  T:  05/16/2010  Job:  [id number]  Electronically Signed by Elta Guadeloupe Breylen Agyeman  on 05/21/2010 08:14:54 AM

## 2010-06-10 NOTE — Discharge Summary (Signed)
NAME:  [name] [name], [name] [name] NO.:  [id number]   MEDICAL RECORD NO.:  [id number]          PATIENT TYPE:  IPS   LOCATION:  0303                          [facility]  [facility]   PHYSICIAN:  Stark Jock, M.D. DATE OF BIRTH:  1955-10-13   DATE OF ADMISSION:  05/15/2008  DATE OF DISCHARGE:  05/18/2008                               DISCHARGE SUMMARY   IDENTIFICATION:  This is a 55 year old single African American male, who  lives alone in [place].  He was admitted on May 15, 2008.   HISTORY OF PRESENT ILLNESS:  The patient reports with a history of  auditory hallucinations and has been drinking.  He relapsed  approximately 6 weeks ago, drinking a fifth a day, wanting to get help.  He has been off his medications for some time.  He denies any depression  or suicidal thoughts.  He has no problem with sleep or appetite.  For  further admission information, see psychiatric admission assessment.   PHYSICAL FINDINGS:  This is a middle-aged man, who appears well  nourished, fully assessed at [facility].  There  were no acute physical or medical problems noted.   LABORATORIES:  Hemoglobin was 15.3, hematocrit was 45.  Alcohol 204.  Lipase 20.  Urine drug screen is negative.  SGOT is 74, SGPT is 61.   HOSPITAL COURSE:  Upon admission, the patient was started on the Librium  detox protocol.  He was also started on Ambien 5 mg p.o. q.h.s. p.r.n.  insomnia, Abilify 10 mg p.o. q.h.s., and trazodone 100 mg p.o. q.h.s.  He was restarted on lisinopril 20 mg daily and hydrochlorothiazide 25 mg  daily for hypertension.  He was also started on Protonix 40 mg q.a.m.  for GERD.  He tolerated his medications well with no significant side  effects.  In individual sessions, the patient presented initially as a  well-nourished, well-developed male, who was alert and oriented x4.  He  was pleasant and appropriate.  There was no thought disorder.  His mood  was depressed.  He  had no suicidal ideation.  He wanted to be here for  help including restarting his Abilify and Librium detox protocol.  On  May 16, 2008, the patient stated he felt better.  He was sleeping and  eating well.  He has been attending unit therapeutic groups and  activities appropriately.  On May 17, 2008, he continued to tolerate  his detox protocol well.  He discussed his drinking prior to admission  in session with me.  His sleep was good and appetite was good.  His mood  was becoming less depressed and he was looking forward to discharge the  following day.  He stated his sister will give him a ride home.  On  May 18, 2008, mood was less depressed, less anxious.  Affect  consistent with mood.  There was no suicidal or homicidal ideation.  No  thoughts of self-injurious behavior.  No auditory or visual  hallucinations.  No paranoia or delusions.  Thoughts were logical and  goal-directed.  Thought content, no predominant theme.  Cognitive  was  grossly intact.  Insight good.  Judgment good.  Impulse control good.  It was felt the patient was safe for discharge today.   DISCHARGE DIAGNOSES:  Axis I:  Polysubstance abuse.  Mood disorder not  otherwise specified.  Axis II:  None.  Axis III:  Hypertension, gastroesophageal reflux disease.  Axis IV:  Moderate (burden of psychiatric illness, burden of chemical  dependency illness, medical problems).  Axis V:  Global assessment of functioning was 50 at the time of  discharge.  Global assessment of functioning was 35 upon admission.  Global assessment of functioning highest past year was 60-65.   DISCHARGE PLANS:  There are no specific activity level or dietary  restrictions.   POSTHOSPITAL CARE PLANS:  The patient will follow up at the [facility] on May 29, 2008, at 8:30 a.m.   DISCHARGE MEDICATIONS:  1. Abilify 10 mg at bedtime.  2. Trazodone 100 mg at bedtime.  3. Prinivil 20 mg daily.  4. Hydrochlorothiazide 25 mg daily.  5.  Protonix 40 mg daily.      Stark Jock, M.D.  Electronically Signed     BHS/MEDQ  D:  05/18/2008  T:  05/19/2008  Job:  [id number]

## 2010-06-10 NOTE — H&P (Signed)
NAME:  [name] [name], [name] [name] NO.:  [id number]   MEDICAL RECORD NO.:  [id number]          PATIENT TYPE:  IPS   LOCATION:  0406                          [facility]ILITY[facility]  [facility]   PHYSICIAN:  Norm Salt, MD  DATE OF BIRTH:  29-Dec-1955   DATE OF ADMISSION:  05/15/2008  DATE OF DISCHARGE:                       PSYCHIATRIC ADMISSION ASSESSMENT   HISTORY OF PRESENT ILLNESS:  The patient presents with a history of  auditory hallucinations and has been drinking.  He relapsed  approximately 6 weeks ago, drinking a fifth a day, wanting to get help,  has been off his medications for some time.  He denies any depression or  suicidal thoughts.  He has had no problem with sleep or appetite.   PAST PSYCHIATRIC HISTORY:  The patient states he has been here before,  although no transcribed reports were found.  He sees Dr. Wallace Going at the  [facility].   SOCIAL HISTORY:  He is a 55 year old male who lives alone.  Lives in  [place].  He is on disability.   FAMILY HISTORY:  None.   ALCOHOL AND DRUG HISTORY:  The patient has been drinking approximately a  fifth a day.  Denies any seizures or blackouts.  Last drink was day  prior to arrival.  Denies any other recreational drug use.   PRIMARY CARE Abbygael Curtiss:  [facility].   MEDICAL PROBLEMS:  Hypertension.   MEDICATIONS:  Abilify, lisinopril 20 daily, Protonix, and trazodone  listed.  Last use was approximately 1-1/2 months ago.   PHYSICAL EXAMINATION:  GENERAL:  This is a middle-aged male who appears  to be well nourished fully assessed at [facility].  Of note was that the patient presented with an unkempt appearance.  No  other physical symptoms noted.  VITAL SIGNS:  Temperature of 97.4, heart rate 69, respirations 20, blood  pressure 157/93.   LABORATORIES:  Hemoglobin 15.3, hematocrit of 45.  Alcohol level of 204,  lipase of 20.  Urine drug screen is negative.  SGOT is 74, SGPT 61.   MENTAL STATUS  EXAMINATION:  This is a middle-aged male resting in bed,  cooperative, good eye contact.  His speech is soft spoken but clear,  does not expand on any questions asked.  Mood is neutral.  The patient's  affect is appropriate, pleasant.  Thought processes are endorsing  auditory hallucinations, but he states they are not bad.  He denies  any command-type hallucinations.  No SI, no HI, no delusional statements  made, cognitive function intact.  His memory appears intact.  Judgment  and insight are fair.   Axis I:  Polysubstance abuse.  History of psychosis.  Axis II:  Deferred.  Axis III:  Hypertension.  Axis IV:  Deferred at this time.  Axis V:  Current is 35.   PLAN:  Resume Abilify.  Will put patient on the Librium protocol.  Will  encourage fluids.  The patient will be in the dual-diagnosis program.  Case manager will assess his rehab programs available.  Will review  medication compliance and relapse prevention.  His tentative length of  stay at this  time is 3-5 days.      Redgie Grayer, N.P.      Norm Salt, MD  Electronically Signed    JO/MEDQ  D:  05/15/2008  T:  05/15/2008  Job:  [id number] [id number]

## 2010-06-10 NOTE — H&P (Signed)
NAME:  [name] [name], GODA NO.:  [id number]   MEDICAL RECORD NO.:  [id number]          PATIENT TYPE:  EMS   LOCATION:  MAJO                         FACILITY:  [facility]   PHYSICIAN:  Orpah Melter, MD       DATE OF BIRTH:  [birth year]   DATE OF ADMISSION:  02/22/2008  DATE OF DISCHARGE:                              HISTORY & PHYSICAL   HISTORY OF PRESENT ILLNESS:  The patient is a 55 year old male who had  an acute episode or shortness of breath and dyspnea this a.m., went to  the emergency department, and was diagnosed with flash pulmonary edema.  The patient does not have a previous history of these symptoms.  Denies  fevers, chills, night sweats, nausea, vomiting, chest pain, dyspnea on  exertion, diarrhea, or constipation.  The patient, however, has tested  positive for cocaine.  The patient is now symptom free and his workup so  far has yielded no alternative diagnosis.  So, I am assuming that his  flash pulmonary edema was secondary to likely laryngeal spasm probably  from cocaine or opiate use.  The patient is now being discharged to home  with instructions to follow up with his primary care physician if his  symptoms recur.   PAST MEDICAL HISTORY:  1. Hypertension.  2. Schizophrenia.  3. Gastroesophageal reflux disease.  4. Gout.   SOCIAL HISTORY:  The patient denies tobacco or alcohol use.  Denies  illicit drug use, however, tox screen is positive for opiates and  cocaine.   FAMILY HISTORY:  Not available.   REVIEW OF SYSTEMS:  All other comprehensive review of systems are  negative.   ALLERGIES:  The patient is allergic to THORAZINE with unknown reaction.   CURRENT MEDICATIONS:  Abilify of unknown dosage and hydrochlorothiazide  of unknown dosage and frequency.   PHYSICAL EXAMINATION:  GENERAL:  This is a well-developed, well-  nourished black male, currently in no apparent distress.  VITAL SIGNS:  Blood pressure 144/85, heart rate 58, respiratory rate  20,  and temperature 98.1.  NECK:  No jugular venous distention or lymphadenopathy.  HEENT:  Oropharynx is clear.  Mucous membranes pink and moist.  TMs  clear bilaterally.  Pupils equal and reactive to light and  accommodation.  Extraocular muscles are intact.  CARDIOVASCULAR:  Regular rate and rhythm without murmurs, rubs, or  gallops.  PULMONARY:  Lungs clear to auscultation bilaterally.  ABDOMEN:  Soft, nontender, and nondistended without hepatosplenomegaly.  Bowel sounds are present.  He has no rebound or guarding.  EXTREMITIES:  He has no clubbing, cyanosis, or edema.  He has good  peripheral pulses in dorsalis pedis and radial arteries.  He is able to  move all extremities.  NEURO:  Cranial nerves II through XII are grossly intact.  He has no  focal neurological deficits.   STUDIES:  Chest x-ray shows pulmonary edema and mild cardiomegaly.   LABORATORY DATA:  White count 4.5, H and H 15 and 44, MCV 92, and  platelets 201.  Sodium 140, potassium 4.1, chloride 110, bicarb 20, BUN  15, creatinine 0.9, and glucose  105.  BNP of 101.  Tox screen positive  for cocaine and opiates.  D-dimer 0.22.   ASSESSMENT AND PLAN:  1. Flash pulmonary edema, symptoms have resolved.  Disposition is as      above.  We will discharge to home.  2. Hypertension.  3. Schizophrenia.  4. Gastroesophageal reflux disease.  5. Gout, all of which are stable and the patient will be discharged      with instructions to follow up with his primary care physician at      home.  H and P was constructed by reviewing past medical history,      conferring with emergency medical room physician, and reviewing the      emergency medical record.   TIME SPENT:  One hour.      Orpah Melter, MD  Electronically Signed     JF/MEDQ  D:  02/22/2008  T:  02/23/2008  Job:  [id number]
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
## 2010-06-13 NOTE — Consult Note (Signed)
NAME:  [name] [name], [name] [name]             ACCOUNT NO.:  [id number]   MEDICAL RECORD NO.:  [id number]          PATIENT TYPE:  INP   LOCATION:  6736                         FACILITY:  [facility]   PHYSICIAN:  Lear Ng, MDDATE OF BIRTH:  [birth year]   DATE OF CONSULTATION:  DATE OF DISCHARGE:                                   CONSULTATION   REQUESTING PHYSICIAN:  Helen Hashimoto, MD.   REASON FOR CONSULTATION:  Splenic cyst (abnormal CT).   HISTORY OF PRESENT ILLNESS:  [name] [name] is a 55 year old black male who  presented to the hospital on September 03, 2005 secondary to shortness of breath  and cough.  He underwent a CT angiogram to look for a pulmonary embolus and  this CT angiogram showed no evidence of pulmonary embolus but did show  multiple complex splenic cysts with the largest cyst being 7.5 x 5.6 cm and  having a septated appearance with peripheral calcifications.  He also was  found to have peribronchial air space opacities thought to be  bronchopneumonia versus atypical edema.  [name] [name] denies any travel  history and denies any parasitic or bacterial infections in the past.  He  does report occasional periumbilical abdominal pain and occasional reflux  which has improved with proton pump inhibitor therapy.  He denies any left  upper quadrant pain, denies any IV drug use, denies any history of  endocarditis.  He also denies any family history of colon cancer.  He has  never had a screening colonoscopy.   PAST MEDICAL HISTORY:  1. Hypertension.  2. Hypercholesterolemia.  3. Schizophrenia.  4. Reflux.   MEDICATIONS ON ADMISSION:  Abilify, hydrochlorothiazide, Prilosec.   ALLERGIES:  THORAZINE.   SOCIAL HISTORY:  Occasional alcohol, former smoker, denies IV drug use or  illicit drugs.   FAMILY HISTORY:  Positive for hypertension and coronary artery disease.   PHYSICAL EXAM:  VITAL SIGNS:  Temperature 98.1, pulse 66, blood pressure  131/74, O2 sat 96% on room air.  GENERAL:  Alert, no acute distress.  HEENT:  Nonicteric sclerae.  HEART:  Regular rate and rhythm.  CHEST:  Clear to auscultation bilaterally.  ABDOMEN:  Soft, nontender, nondistended.  Active bowel sounds, spleen tip  not palpable.   LABORATORY DATA:  White blood count 5.4, hemoglobin 13.2, platelets 233,  total bilirubin 0.6, ALP 56, AST 45, ALT 80, albumin 3.2.   IMPRESSION:  A 55 year old black male admitted secondary to shortness of  breath and cough found to have multiple complex splenic cysts of unclear  etiology.  The patient may have a bacterial source as the cause of these  cysts although at this time this source is not clearly identified.  I doubt  he has a colonic source but would need a colonoscopy as an outpatient based  on his age and especially if other bacterial sources are ruled out.  Would  do a dedicated abdominopelvic CT scan with contrast to better look at the  liver and evaluate the pancreas and colon.  The patient likely will need  infectious disease evaluation regarding possible bacteremic source of these  cysts  and therefore I will consult infectious disease.  Will follow along  closely with you and thank you for this consult.      Lear Ng, MD  Electronically Signed     VCS/MEDQ  D:  09/04/2005  T:  09/05/2005  Job:  [id number]   cc:   Helen Hashimoto, MD  Michel Bickers, M.D.

## 2010-06-13 NOTE — Discharge Summary (Signed)
NAME:  [name] [name], [name] [name]             ACCOUNT NO.:  [id number]   MEDICAL RECORD NO.:  [id number]          PATIENT TYPE:  INP   LOCATION:  6736                         FACILITY:  [facility]   PHYSICIAN:  Helen Hashimoto, MD    DATE OF BIRTH:  12/01/1955   DATE OF ADMISSION:  09/03/2005  DATE OF DISCHARGE:  09/06/2005                                 DISCHARGE SUMMARY   PRIMARY PHYSICIAN:  The patient does not have a primary physician and was  admitted as unassigned.   DISCHARGE DIAGNOSES:  1. Community acquired pneumonia.  2. Hypoxemia.  3. Multiple splenic cysts.  4. Elevated liver enzymes.  5. Hypertension.  6. Schizophrenia.   MEDICATIONS:  1. Avalox 400 mg p.o. daily for 7 days.  2. Hydrochlorothiazide 25 mg p.o. once daily.  3. Prilosec 40 mg p.o. once daily.  4. Abilify patient to take the same dose he was taking at home.   CONSULTATIONS:  1. GI consult with Dr. Wilford Corner.  2. ID consult with Dr. Michel Bickers.   PROCEDURES:  None.   Imaging during hospitalization  1. Chest x-ray done on September 03, 2005 and showed bilateral air space      opacities that are consistent with bronchopneumonia.  2. CT scan of the chest done on September 03, 2005, showed no evidence of      pulmonary embolism with bilateral bronchial spaces, suspicious for      bronchopneumonia and also multiple prominent mediastinal lymph nodes      that may be reactive, as well as multiple complex splenic cysts, the      largest is 7.5 x 5.6 cm.  3. CT of the abdomen and pelvis done in September 04, 2005 and showed      multiple splenic cysts, the largest is 7.8 x 5.7 cm and no evidence of      lymphadenopathy.   REASON FOR HOSPITALIZATION:  This is a 55 year old African American male,  past medical history of hypertension, presented with increasing shortness of  breath and cough.   COURSE OF HOSPITALIZATION BY MEDICAL PROBLEM:  1. Community acquired pneumonia.  As mentioned above, the chest x-ray and  CT scan of the chest showed extensive bilateral infiltrates, which is      consistent with bronchopneumonia.  Patient was admitted to the floor      and was started on Avalox 400 mg IV once daily.  Patient was requiring      some oxygen at the beginning because of this pneumonia and then his      oxygen was stable out and he tolerated well.  After stabilization,      patient was switched to Avalox 400 p.o. once daily.  He will be      discharged, given more 7 days of Avalox.  2. Hypoxemia.  That was secondary to his pneumonia and as mentioned above,      he was started initially on oxygen and that was tapered off and now at      the time of discharge, his saturation is well in room air.  3. Multiple complex  splenic cysts.  The CT scan of the chest initially      showed multiple splenic cysts and then those was confirmed by abdominal      CT.  The largest one is around 7.8 x 5.7 cm.  GI consultation and ID      consultation were called for further evaluation of those cysts.  The ID      think those are chronic cysts and there is no evidence of abscess on      them.  Multiple tests were ordered, including histoplasma and HIV tests      to be followed and to rule out any underlying etiology of those cysts.      Those tests currently, the HIV test antibody scan to be negative.      Other tests are pending and they will be checked out when the patient      go and follow with Dr. Megan Salon in the [facility].  4. Elevated liver enzymes.  As mentioned above, tests were sent for HIV      and hepatitis CMP and the patient will follow the results in the [facility].  5. Hypertension.  Same medications were continued.  6. Schizophrenia.  Same medications were continued.   FOLLOWUP APPOINTMENT:  Dr. Michel Bickers in one week.   CONDITION ON DISCHARGE:  Stable.   Total assessment time is 40 minutes.      Helen Hashimoto, MD  Electronically Signed     NAE/MEDQ  D:  09/06/2005  T:   09/06/2005  Job:  [id number]   cc:   Michel Bickers, M.D.

## 2010-06-13 NOTE — Op Note (Signed)
NAME:  CODEY, TRICKEY             ACCOUNT NO.:  [id number]   MEDICAL RECORD NO.:  [id number]          PATIENT TYPE:  AMB   LOCATION:  [place]                          FACILITY:  [facility]   PHYSICIAN:  Kathalene Frames. Mayer Camel, M.D.   DATE OF BIRTH:  07/11/1955   DATE OF PROCEDURE:  09/23/2005  DATE OF DISCHARGE:                                 OPERATIVE REPORT   SURGEON:  Kathalene Frames. Mayer Camel, M.D.   FIRST ASSISTANT:  Opal Sidles B. Mercie Eon.   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   PROCEDURE:  Open repair, left Achilles' tendon, using almost a Z-plasty type  technique because of the nature of the tear that was partially healed.   ANESTHETIC:  General endotracheal.   ESTIMATED BLOOD LOSS:  Minimal.   FLUIDS REPLACEMENT:  800 cc of crystalloid.   DRAINS PLACED =:  None.   TOURNIQUET TIME:  40 minutes.   INDICATIONS FOR PROCEDURE:  A 55 year old man, who sustained a left  Achilles' tendon rupture about 5 weeks ago.  Because of scheduling issues,  we were not able to get him on the OR schedule until just recently.  When I  say scheduling issues, it was mainly a matter of convenience for him, not  [place].  But in any event, because of the Achilles' tendon rupture, and because  of it was five weeks, open repair is required.  The risks and benefits of  the surgery were discussed.  The patient was prepared for surgical  intervention.   DESCRIPTION OF PROCEDURE:  The patient was identified by me and taken to the  operating room at [facility].  Appropriate anesthetic monitors  were attached and general endotracheal anesthesia induced with the patient  in the supine position.  He was then placed in the prone position and well  padded.  A tourniquet was applied high to the left calf, and the left lower  extremity prepped and draped in the usual sterile fashion from the toes to  the  tourniquet.  The limb wrapped with an Esmarch bandage, tourniquet  inflated to 300 mmHg.  A longitudinal posteromedial incision was then made  through the skin and subcutaneous tissue down to the scar tissue  over the  tendon remnant.  Once we got there, we opened up the paratenon, at least  where we could find it; and the tendon was noted to have a fairly complete  transverse tear distally, and proximally there were a medial and lateral  flap extending downward that were at least partially healed.  These were  mobilized.  The ends were freshened up and the knee flexed and the foot  plantar flexed.  We then placed two #5 FiberWire core sutures, one proximal  and one distal, using a whip stitch technique medially and laterally.  And  then using the whip stitch cores, went ahead and reapproximated the ends,  and then took the medial and lateral flaps off the proximal stump and then  sewed them down to the outside of the distal stump,  again, with a #5  FiberWire, using a continuous type of suture.  Once this had been  accomplished and all suture ends tied, the tendon was noted to be in  continuity.  The knee could be brought out to about 30 degrees of extension  with minimal tension on the repair.  This did require mobilization of the  gastroc muscle using manual palpation between the muscle and the  subcutaneous fascia, since it was scarred down somewhat.  Once this had been  accomplished, the wound was thoroughly irrigated out with normal saline  solution.  The subcutaneous tissue was closed with 2-0 Vicryl suture, and  then the skin closed with Dermabond.  A dressing of Xeroform, 4 x 4 dressing  sponges, Webril and an Ace wrap was then applied, followed by a posterior  splint 45 degrees plantar flexed.  The patient was then rolled supine,  awakened and taken to the recovery room without difficulty.      Kathalene Frames. Mayer Camel, M.D.  Electronically Signed     FJR/MEDQ  D:  09/23/2005  T:   09/24/2005  Job:  [id number]

## 2010-06-13 NOTE — H&P (Signed)
NAME:  MAAHIR, WALDROUP NO.:  [id number]   MEDICAL RECORD NO.:  [id number]          PATIENT TYPE:  EMS   LOCATION:  [place]                         FACILITY:  [facility]   PHYSICIAN:  Helen Hashimoto, MD    DATE OF BIRTH:  10-04-1955   DATE OF ADMISSION:  09/03/2005  DATE OF DISCHARGE:                                HISTORY & PHYSICAL   PRIMARY CARE PHYSICIAN:  The patient will be admitted as unassigned.   CHIEF COMPLAINT:  Increasing shortness of breath and cough.   HISTORY OF PRESENT ILLNESS:  This is a 55 year old African-American male  with a past medical history of hypertension and hypercholesterolemia who  presented with the above-mentioned complaint. He stated that last night  around 1 a.m., he was at home when he started to develop sudden onset of  shortness of breath and cough. The cough was a dry cough without any sputum.  The shortness of breath was increasing and he got to the point where he  cannot breath well and he is gasping for air, so he tried to stand up and he  felt dizzy. He called EMS. In the ER, a chest x-ray was done on him and it  showed opacification that might be consistent either with pulmonary embolism  or extensive bilateral infiltrate. Hospitalist service was called for  further evaluation.   PAST MEDICAL HISTORY:  1. Hypertension.  2. Hypercholesterolemia.  3. Schizophrenia.  4. GERD.   PAST SURGICAL HISTORY:  Denied.   MEDICATIONS:  1. Abilify unknown dose.  2. Hydrochlorothiazide 25 mg p.o. once daily.  3. Prilosec 40 mg p.o. once daily.   ALLERGIES:  The patient is allergic to THORAZINE.   SOCIAL HISTORY:  He quit smoking around 3-1/2 years ago. Drinks occasionally  and he denies recreational drugs.   FAMILY HISTORY:  Significant for hypertension and coronary artery disease in  his mother.   REVIEW OF SYSTEMS:  CONSTITUTIONAL:  Positive fatigability. There is no  fever, there is no weight changes. EYES:  No blurred vision,  no pain. ENT:  No epistaxis. No difficulty swallowing. RESPIRATORY:  Positive for cough.  There is no sputum, there is no wheezes. CARDIOVASCULAR:  Positive for  increasing shortness of breath with some palpitations. There is no edema and  no acute pain. GI:  No nausea, no vomiting, no diarrhea. ID:  No rash, no  lesions. HEMATOLOGY:  No bruises, no bleeding. GU:  No hematuria, no  dysuria. NEUROLOGY:  No numbness, no tingling. The rest of the systems were  reviewed and they were negative.   PHYSICAL EXAMINATION:  VITAL SIGNS:  Temperature is 98.5, blood pressure is  142/87, pulse is 76, respiratory rate 22.  GENERAL:  This is middle aged African-American male who is laying down in  bed, not in acute distress at the present time.  HEENT:  Conjunctiva is normal, no palor and no erythema. Pupils are equal  and reactive to light and accommodation. There is no ptosis, cilia are  intact. There is no ear discharge or infection.  There is no nasal  discharge, infection or bleeding.  Oral mucosa is moist and there is no  pharyngeal erythema.  NECK:  Supple, no JVD, no carotid bruit, no thyroid enlargement or  tenderness.  CARDIOVASCULAR:  S1, S2 regular. There is no murmurs, no gallops and no  thrills.  RESPIRATORY:  The patient is breathing between 16-18, no use of accessory  muscles, no intercostal retractions, no dullness, no rales, positive rhonchi  but there is no wheezes.  ABDOMEN:  The abdomen is soft, nondistended. There is no hepatosplenomegaly.  Bowel sounds are normal. Umblicus is cental.  EXTREMITIES:  Lower extremities no edema, no rash.  SKIN:  No rash and no erythema.  NEUROLOGIC:  Cranial nerves intact II-XII. There is no motor or sensory  deficits.   LABORATORY DATA:  Sodium is 141, potassium 3.1, chloride 101, bicarb 11,  glucose 109. WBC 5.4, hemoglobin 13.2, hematocrit 38.4, MCV is 92.5. Her  platelet count is 233. CK-MB is 1.4, troponin is less than 0.5.   Chest x-ray  showed bilateral opacification that is consistent with either  pulmonary edema or extensive bilateral infiltrate.   ASSESSMENT/PLAN:  1. Pneumonia. This patient has extensive bilateral infiltrate in the x-ray      which might be consistent with pneumonia. I will start him on Avelox      400 mg intravenous once daily. I will also send for a sputum culture on      him. Will put him in oxygen as needed and will give him bronchodilators      as needed.  2. Hypoxemia. This is secondary to his underlying pneumonia versus edema.      I will give him oxygen as needed.  3. Rule out congestive heart failure. The x-ray is consistent with      pulmonary edema but there is no other signs of congestion. The patient      received Lasix in the emergency room and he diuresed. His BNP is      normal. I will get echocardiogram to rule out underlying congestive      heart failure and if this comes to be abnormal then I will consider      starting Lasix plus beta blockers plus ACE inhibitors.  4. Rule out pulmonary embolus. This patient's D-dimers are normal. The      sudden onset of the symptoms might be suggestive of PE although there      is no risk factors to develop PE. Anyway CT  angiogram was done by the      emergency room attendant and the results are pending. Meanwhile will      give the patient Lovenox and then we will manage according to the      results of the CT.  5. Hypoxemia. We will give potassium chloride and repeat his potassium      level.  6. Hypertension. Will continue current medications.  7. Schizophrenia. Will continue current medications.   ASSESSMENT TIME:  55 minutes.      Helen Hashimoto, MD  Electronically Signed     NAE/MEDQ  D:  09/03/2005  T:  09/03/2005  Job:  [id number]

## 2010-06-17 ENCOUNTER — Ambulatory Visit: Payer: Medicare Other | Attending: Orthopedic Surgery

## 2010-10-15 LAB — BASIC METABOLIC PANEL
CO2: 23
Calcium: 9.8
GFR calc Af Amer: >60
Potassium: 4.5
Sodium: 137

## 2010-10-15 LAB — CBC
HCT: 39.9
Hemoglobin: 13.3
MCHC: 33.5
RBC: 4.29

## 2010-10-15 LAB — DIFFERENTIAL
Basophils Relative: 0
Eosinophils Relative: 1
Lymphocytes Relative: 14
Monocytes Absolute: 0.6
Monocytes Relative: 6
Neutro Abs: 7.8 — ABNORMAL HIGH

## 2010-10-15 LAB — WOUND CULTURE

## 2010-10-21 LAB — LIPID PANEL
Cholesterol: 255 — ABNORMAL HIGH
HDL: 45
LDL Cholesterol: UNDETERMINED
Total CHOL/HDL Ratio: 5.7

## 2010-10-21 LAB — DIFFERENTIAL
Lymphocytes Relative: 13
Lymphs Abs: 0.8
Neutro Abs: 5.3
Neutrophils Relative %: 80 — ABNORMAL HIGH

## 2010-10-21 LAB — URINALYSIS, ROUTINE W REFLEX MICROSCOPIC
Glucose, UA: NEGATIVE
Leukocytes, UA: NEGATIVE
Protein, ur: 100 — AB
Specific Gravity, Urine: 1.023
pH: 5.5

## 2010-10-21 LAB — CK TOTAL AND CKMB (NOT AT ARMC)
CK, MB: 3.7
Relative Index: 1.7
Total CK: 217

## 2010-10-21 LAB — CBC
HCT: 35 — ABNORMAL LOW
Hemoglobin: 12.3 — ABNORMAL LOW
MCHC: 34.4
MCHC: 35
MCV: 92.1
Platelets: 172
Platelets: 174
Platelets: 175
Platelets: 192
RBC: 3.8 — ABNORMAL LOW
RBC: 3.82 — ABNORMAL LOW
RBC: 3.89 — ABNORMAL LOW
RBC: 4.42
WBC: 10.5
WBC: 6
WBC: 6.1
WBC: 6.7
WBC: 7.8

## 2010-10-21 LAB — APTT
aPTT: 31
aPTT: 55 — ABNORMAL HIGH
aPTT: 57 — ABNORMAL HIGH
aPTT: 58 — ABNORMAL HIGH

## 2010-10-21 LAB — BASIC METABOLIC PANEL
BUN: 6
BUN: 6
BUN: 7
CO2: 24
CO2: 25
CO2: 26
Calcium: 8.5
Chloride: 100
Chloride: 101
Creatinine, Ser: 0.83
Creatinine, Ser: 0.83
Creatinine, Ser: 0.9
GFR calc Af Amer: >60
GFR calc Af Amer: >60
GFR calc Af Amer: >60
GFR calc non Af Amer: >60
GFR calc non Af Amer: >60
GFR calc non Af Amer: >60
Glucose, Bld: 101 — ABNORMAL HIGH
Glucose, Bld: 104 — ABNORMAL HIGH
Potassium: 3.5
Potassium: 3.8
Potassium: 4
Potassium: 4.3
Sodium: 131 — ABNORMAL LOW
Sodium: 134 — ABNORMAL LOW
Sodium: 134 — ABNORMAL LOW

## 2010-10-21 LAB — COMPREHENSIVE METABOLIC PANEL
Albumin: 3.6
BUN: 10
Chloride: 98
Creatinine, Ser: 0.86
Total Bilirubin: 2.4 — ABNORMAL HIGH

## 2010-10-21 LAB — CARDIAC PANEL(CRET KIN+CKTOT+MB+TROPI)
CK, MB: 2.6
Total CK: 154
Troponin I: 0.04

## 2010-10-21 LAB — HELICOBACTER PYLORI ANTIGEN DET, STOOL: H pylori Ag, Stl: NEGATIVE

## 2010-10-21 LAB — BILIRUBIN, FRACTIONATED(TOT/DIR/INDIR)
Indirect Bilirubin: 1.1 — ABNORMAL HIGH
Total Bilirubin: 1.5 — ABNORMAL HIGH

## 2010-10-21 LAB — RAPID URINE DRUG SCREEN, HOSP PERFORMED
Barbiturates: NOT DETECTED
Cocaine: POSITIVE — AB
Opiates: POSITIVE — AB

## 2010-10-21 LAB — POCT I-STAT, CHEM 8
BUN: 12
Calcium, Ion: 1.13
Chloride: 101
Potassium: 4.2

## 2010-10-21 LAB — HEPARIN INDUCED PLATELET AGGREGATION (CONVERTED LAB)
Heparin 1 Donor: 2
Heparin 1 Patient: 2
Heparin 100 Donor: 2
Interpretation-HITDU: NEGATIVE
Saline Donor:: 1
Saline Patient:: 2

## 2010-10-21 LAB — AMYLASE: Amylase: 62

## 2010-10-21 LAB — HEMOGLOBIN A1C: Hgb A1c MFr Bld: 5.2

## 2010-10-21 LAB — URINE MICROSCOPIC-ADD ON

## 2010-10-21 LAB — HIV-1 RNA QUANT-NO REFLEX-BLD
HIV 1 RNA Quant: <50 copies/mL (ref <50)
HIV-1 RNA Quant, Log: <1.7 (ref <1.70)

## 2010-10-21 LAB — OCCULT BLOOD X 1 CARD TO LAB, STOOL: Fecal Occult Bld: NEGATIVE

## 2010-10-21 LAB — MAGNESIUM: Magnesium: 2

## 2010-10-21 LAB — LIPASE, BLOOD: Lipase: 53

## 2010-10-30 LAB — COMPREHENSIVE METABOLIC PANEL
ALT: 42 U/L (ref 0–53)
AST: 37 U/L (ref 0–37)
Alkaline Phosphatase: 62 U/L (ref 39–117)
CO2: 22 mEq/L (ref 19–32)
Chloride: 105 mEq/L (ref 96–112)
GFR calc Af Amer: >60 mL/min (ref >60)
GFR calc non Af Amer: >60 mL/min (ref >60)
Potassium: 4.1 mEq/L (ref 3.5–5.1)
Sodium: 136 mEq/L (ref 135–145)
Total Bilirubin: 0.8 mg/dL (ref 0.3–1.2)

## 2010-10-30 LAB — DIFFERENTIAL
Basophils Relative: 1 % (ref 0–1)
Eosinophils Absolute: 0.1 10*3/uL (ref 0.0–0.7)
Eosinophils Relative: 3 % (ref 0–5)
Lymphs Abs: 2.3 10*3/uL (ref 0.7–4.0)

## 2010-10-30 LAB — LIPASE, BLOOD: Lipase: 29 U/L (ref 11–59)

## 2010-10-30 LAB — CBC
Hemoglobin: 13 g/dL (ref 13.0–17.0)
RBC: 4.23 MIL/uL (ref 4.22–5.81)
WBC: 5.4 10*3/uL (ref 4.0–10.5)

## 2010-10-30 LAB — URINALYSIS, ROUTINE W REFLEX MICROSCOPIC
Bilirubin Urine: NEGATIVE
Ketones, ur: NEGATIVE mg/dL
Nitrite: NEGATIVE
pH: 5.5 (ref 5.0–8.0)

## 2010-11-04 LAB — URINALYSIS, ROUTINE W REFLEX MICROSCOPIC
Ketones, ur: 15 — AB
Leukocytes, UA: NEGATIVE
Nitrite: NEGATIVE
Protein, ur: 100 — AB
Urobilinogen, UA: 1

## 2010-11-04 LAB — COMPREHENSIVE METABOLIC PANEL
Alkaline Phosphatase: 83
BUN: 10
Chloride: 103
Glucose, Bld: 109 — ABNORMAL HIGH
Potassium: 5.2 — ABNORMAL HIGH
Total Bilirubin: 2 — ABNORMAL HIGH
Total Protein: 6.1

## 2010-11-04 LAB — LIPASE, BLOOD: Lipase: 710 — ABNORMAL HIGH

## 2010-12-18 ENCOUNTER — Emergency Department (HOSPITAL_COMMUNITY)
Admission: EM | Admit: 2010-12-18 | Discharge: 2010-12-18 | Disposition: A | Discharge status: Home / Self Care | Payer: PRIVATE HEALTH INSURANCE | Attending: Emergency Medicine | Admitting: Emergency Medicine

## 2010-12-18 ENCOUNTER — Encounter (HOSPITAL_COMMUNITY): Payer: Self-pay

## 2010-12-18 DIAGNOSIS — I10 Essential (primary) hypertension: Secondary | ICD-10-CM | POA: Insufficient documentation

## 2010-12-18 DIAGNOSIS — M549 Dorsalgia, unspecified: Secondary | ICD-10-CM | POA: Insufficient documentation

## 2010-12-18 DIAGNOSIS — M5430 Sciatica, unspecified side: Secondary | ICD-10-CM | POA: Insufficient documentation

## 2010-12-18 DIAGNOSIS — IMO0002 Reserved for concepts with insufficient information to code with codable children: Secondary | ICD-10-CM | POA: Insufficient documentation

## 2010-12-18 MED ORDER — HYDROCODONE-ACETAMINOPHEN 5-500 MG PO TABS: 1.0000 | ORAL_TABLET | Freq: Four times a day (QID) | ORAL | Status: AC | PRN

## 2010-12-18 MED ORDER — HYDROMORPHONE HCL PF 1 MG/ML IJ SOLN
1.0000 mg | Freq: Once | INTRAMUSCULAR | Status: AC
  Administered 2010-12-18: 1 mg via INTRAMUSCULAR
  Filled 2010-12-18: qty 1

## 2010-12-18 MED ORDER — PREDNISONE 20 MG PO TABS: ORAL_TABLET | ORAL | Status: AC

## 2010-12-18 MED ORDER — PREDNISONE 20 MG PO TABS
60.0000 mg | ORAL_TABLET | Freq: Once | ORAL | Status: AC
  Administered 2010-12-18: 60 mg via ORAL
  Filled 2010-12-18: qty 3

## 2010-12-18 NOTE — ED Notes (Signed)
Patient here with multiple complaints. Reports pain to right shoulder, left arm and left hip and leg. Denies injury-reports seeing ortho for same. Increased pain with ambulation

## 2010-12-18 NOTE — ED Provider Notes (Signed)
History     CSN: [id number] Arrival date & time: 12/18/2010  7:52 AM   First MD Initiated Contact with Patient 12/18/10 0804      No chief complaint on file.   (Consider location/radiation/quality/duration/timing/severity/associated sxs/prior treatment) The history is provided by the patient.  pt c/o left low back pain radiating left leg. States same pain he has had in past couple years. Hx ddd and spinal stenosis. Notes lumbar surgery/?fusion 1-2 yrs ago for same. Says surgery did not get rid of his back and/or radicular pain. No recent injury or strain. No acute or abrupt change in symptoms but states has no meds for same. No numbness/weakness. No fever or chills. No gi or gu c/o. No perineal numbness. Constant, dull, pain. Worse w bending, certain positional changes. No change w activity/exertion, no claudication.   Past Medical History  Diagnosis Date  . Hypertension   . H/O ETOH abuse   . Colitis   . GERD (gastroesophageal reflux disease)   . Schizophrenia, schizo-affective     No past surgical history on file.  No family history on file.  History  Substance Use Topics  . Smoking status: Never Smoker   . Smokeless tobacco: Not on file  . Alcohol Use: Yes      Review of Systems  Constitutional: Negative for fever.  HENT: Negative for neck pain.   Eyes: Negative for pain.  Respiratory: Negative for shortness of breath.   Cardiovascular: Negative for chest pain and leg swelling.  Gastrointestinal: Negative for abdominal pain.  Genitourinary: Negative for dysuria and hematuria.  Musculoskeletal: Positive for back pain. Negative for joint swelling.  Skin: Negative for rash.  Neurological: Negative for weakness, numbness and headaches.  Hematological: Does not bruise/bleed easily.  Psychiatric/Behavioral: Negative for confusion.    Allergies  Chlorpromazine hcl and Oxycodone-acetaminophen  Home Medications  No current outpatient prescriptions on file.  BP  193/98  Pulse 101  Temp(Src) 98.3 F (36.8 C) (Oral)  SpO2 95%  Physical Exam  Nursing note and vitals reviewed. Constitutional: He is oriented to person, place, and time. He appears well-developed and well-nourished. No distress.  HENT:  Head: Atraumatic.  Eyes: Pupils are equal, round, and reactive to light.  Neck: Neck supple. No tracheal deviation present.  Cardiovascular: Normal rate, regular rhythm, normal heart sounds and intact distal pulses.   Pulmonary/Chest: Effort normal and breath sounds normal. No accessory muscle usage. No respiratory distress.  Abdominal: Soft. Bowel sounds are normal. He exhibits no distension and no mass. There is no tenderness.  Genitourinary:       No cva tenderness  Musculoskeletal: Normal range of motion. He exhibits no edema and no tenderness.       Good rom at left hip and knee without pain. Distal pulses palp. straigt neg raise equivocal on left. Spine non tender. Well healed lumbar surgical scar. No skin changes, erythema, sts or rash.   Neurological: He is alert and oriented to person, place, and time. He has normal reflexes.  Skin: Skin is warm and dry.  Psychiatric: He has a normal mood and affect.    ED Course  Procedures (including critical care time)  Labs Reviewed - No data to display No results found.   No diagnosis found.    MDM  Reviewed nursing notes and prior echart records.   Pt states has ride, did not drive. Dilaudid 1 mg im. Note pt denies any med allergies x thorazine (oxycodone noted in chart, but pt denies). Prednisone po.  Mirna Mires, MD 12/18/10 [phone]

## 2011-01-27 DIAGNOSIS — Z8719 Personal history of other diseases of the digestive system: Secondary | ICD-10-CM

## 2011-01-27 HISTORY — DX: Personal history of other diseases of the digestive system: Z87.19

## 2011-02-18 ENCOUNTER — Other Ambulatory Visit: Payer: Self-pay | Admitting: Orthopedic Surgery

## 2011-02-20 ENCOUNTER — Encounter (HOSPITAL_COMMUNITY): Payer: Self-pay | Admitting: Pharmacy Technician

## 2011-02-27 ENCOUNTER — Inpatient Hospital Stay (HOSPITAL_COMMUNITY): Admission: RE | Admit: 2011-02-27 | Payer: Medicare Other | Source: Ambulatory Visit

## 2011-03-03 ENCOUNTER — Encounter (HOSPITAL_COMMUNITY)
Admission: RE | Admit: 2011-03-03 | Discharge: 2011-03-03 | Disposition: A | Discharge status: Home / Self Care | Payer: PRIVATE HEALTH INSURANCE | Source: Ambulatory Visit | Attending: Orthopedic Surgery | Admitting: Orthopedic Surgery

## 2011-03-03 ENCOUNTER — Encounter (HOSPITAL_COMMUNITY): Payer: Self-pay

## 2011-03-03 LAB — DIFFERENTIAL
Basophils Absolute: 0 10*3/uL (ref 0.0–0.1)
Eosinophils Relative: 2 % (ref 0–5)
Lymphocytes Relative: 28 % (ref 12–46)
Lymphs Abs: 1.4 10*3/uL (ref 0.7–4.0)
Monocytes Absolute: 0.4 10*3/uL (ref 0.1–1.0)
Neutro Abs: 3.1 10*3/uL (ref 1.7–7.7)

## 2011-03-03 LAB — TYPE AND SCREEN
ABO/RH(D): A NEG
Antibody Screen: NEGATIVE

## 2011-03-03 LAB — PROTIME-INR: Prothrombin Time: 13.6 seconds (ref 11.6–15.2)

## 2011-03-03 LAB — CBC
HCT: 46 % (ref 39.0–52.0)
MCV: 90.2 fL (ref 78.0–100.0)
Platelets: 213 10*3/uL (ref 150–400)
RBC: 5.1 MIL/uL (ref 4.22–5.81)
RDW: 13.1 % (ref 11.5–15.5)
WBC: 5 10*3/uL (ref 4.0–10.5)

## 2011-03-03 LAB — URINALYSIS, ROUTINE W REFLEX MICROSCOPIC
Bilirubin Urine: NEGATIVE
Hgb urine dipstick: NEGATIVE
Nitrite: NEGATIVE
Protein, ur: 100 mg/dL — AB
Specific Gravity, Urine: 1.028 (ref 1.005–1.030)
Urobilinogen, UA: 0.2 mg/dL (ref 0.0–1.0)

## 2011-03-03 LAB — APTT: aPTT: 26 seconds (ref 24–37)

## 2011-03-03 MED ORDER — CEFAZOLIN SODIUM-DEXTROSE 2-3 GM-% IV SOLR
2.0000 g | INTRAVENOUS | Status: DC
  Filled 2011-03-03: qty 50

## 2011-03-03 MED ORDER — POVIDONE-IODINE 7.5 % EX SOLN
Freq: Once | CUTANEOUS | Status: DC
  Filled 2011-03-03: qty 118

## 2011-03-03 NOTE — Progress Notes (Addendum)
LAB HAD CALLED  DEE-DEE AND TOLD HER THAT ALL THE BLOOD DRAWN TODAY (CBC W/DIFF, CMET, PT AND PTT NEED TO ALL BE REDRAWN TOMORROW AM.... I ASKED BLOOD BANK IF THIS WAS PERTINENT ALSO TO THE T & S....BLOOD BANK SAID IT WAS TOO EARLY (15:08) TO TELL IF THE SAMPLE WAS AFFECTED..........HOPEFULLY THEY WILL CALL ME BEFORE I LEAVE FOR THE DAY.   [place].....THEIR SAMPLE IS GOOD .Marland Kitchen..NO NEED TO REDRAW

## 2011-03-03 NOTE — Pre-Procedure Instructions (Signed)
20 ESPN EBERLEIN  03/03/2011   Your procedure is scheduled on:  Wednesday, Feb 6 th   Report to [facility] at  6:30  AM.   Call this number if you have problems the morning of surgery: [phone]   Remember:   Do not eat food:After [place].  May have clear liquids: up to 4 Hours before arrival time  (2:30AM).  Clear liquids include soda, tea, black coffee, apple or grape juice, broth.   Take these medicines the morning of surgery with A SIP OF WATER: CLONIDINE              NORCO, OMEPRAZOLE   Do not wear jewelry, make-up or nail polish.  Do not wear lotions, powders, or perfumes. You may wear deodorant.     Do not bring valuables to the hospital.   Contacts, dentures or bridgework may not be worn into surgery  Leave suitcase in the car. After surgery it may be brought to your room.  For patients admitted to the hospital, checkout time is 11:00 AM the day of discharge.   Patients discharged the day of surgery will not be allowed to drive home.  Name and phone number of your driver: Emelda Fear ---  SISTER   Special Instructions: CHG Shower Use Special Wash: 1/2 bottle night before surgery and 1/2 bottle morning of surgery.   Please read over the following fact sheets that you were given: Pain Booklet, Coughing and Deep Breathing, Blood Transfusion Information, MRSA Information and Surgical Site Infection Prevention

## 2011-03-04 ENCOUNTER — Encounter (HOSPITAL_COMMUNITY)
Admission: EM | Disposition: A | Discharge status: Home / Self Care | Payer: Self-pay | Source: Ambulatory Visit | Attending: Internal Medicine

## 2011-03-04 ENCOUNTER — Other Ambulatory Visit: Payer: Self-pay

## 2011-03-04 ENCOUNTER — Encounter (HOSPITAL_COMMUNITY): Payer: Self-pay | Admitting: *Deleted

## 2011-03-04 ENCOUNTER — Inpatient Hospital Stay (HOSPITAL_COMMUNITY)
Admission: RE | Admit: 2011-03-04 | Payer: PRIVATE HEALTH INSURANCE | Source: Ambulatory Visit | Admitting: Orthopedic Surgery

## 2011-03-04 ENCOUNTER — Emergency Department (HOSPITAL_COMMUNITY): Payer: PRIVATE HEALTH INSURANCE

## 2011-03-04 ENCOUNTER — Inpatient Hospital Stay (HOSPITAL_COMMUNITY)
Admission: EM | Admit: 2011-03-04 | Discharge: 2011-03-06 | DRG: 439 | Disposition: A | Discharge status: Home / Self Care | Payer: PRIVATE HEALTH INSURANCE | Source: Ambulatory Visit | Attending: Internal Medicine | Admitting: Internal Medicine

## 2011-03-04 DIAGNOSIS — R739 Hyperglycemia, unspecified: Secondary | ICD-10-CM | POA: Diagnosis present

## 2011-03-04 DIAGNOSIS — B1710 Acute hepatitis C without hepatic coma: Secondary | ICD-10-CM | POA: Diagnosis present

## 2011-03-04 DIAGNOSIS — F1910 Other psychoactive substance abuse, uncomplicated: Secondary | ICD-10-CM | POA: Diagnosis present

## 2011-03-04 DIAGNOSIS — K859 Acute pancreatitis without necrosis or infection, unspecified: Principal | ICD-10-CM | POA: Diagnosis present

## 2011-03-04 DIAGNOSIS — F1010 Alcohol abuse, uncomplicated: Secondary | ICD-10-CM | POA: Diagnosis present

## 2011-03-04 DIAGNOSIS — R7402 Elevation of levels of lactic acid dehydrogenase (LDH): Secondary | ICD-10-CM | POA: Diagnosis present

## 2011-03-04 DIAGNOSIS — R7401 Elevation of levels of liver transaminase levels: Secondary | ICD-10-CM | POA: Diagnosis present

## 2011-03-04 DIAGNOSIS — E785 Hyperlipidemia, unspecified: Secondary | ICD-10-CM | POA: Diagnosis present

## 2011-03-04 DIAGNOSIS — R7309 Other abnormal glucose: Secondary | ICD-10-CM | POA: Diagnosis present

## 2011-03-04 DIAGNOSIS — B182 Chronic viral hepatitis C: Secondary | ICD-10-CM | POA: Diagnosis present

## 2011-03-04 DIAGNOSIS — E875 Hyperkalemia: Secondary | ICD-10-CM | POA: Diagnosis present

## 2011-03-04 DIAGNOSIS — I10 Essential (primary) hypertension: Secondary | ICD-10-CM | POA: Diagnosis present

## 2011-03-04 DIAGNOSIS — E871 Hypo-osmolality and hyponatremia: Secondary | ICD-10-CM | POA: Diagnosis present

## 2011-03-04 DIAGNOSIS — F209 Schizophrenia, unspecified: Secondary | ICD-10-CM | POA: Diagnosis present

## 2011-03-04 DIAGNOSIS — Z888 Allergy status to other drugs, medicaments and biological substances status: Secondary | ICD-10-CM

## 2011-03-04 DIAGNOSIS — K219 Gastro-esophageal reflux disease without esophagitis: Secondary | ICD-10-CM | POA: Diagnosis present

## 2011-03-04 LAB — COMPREHENSIVE METABOLIC PANEL
ALT: 99 U/L — ABNORMAL HIGH (ref 0–53)
AST: 139 U/L — ABNORMAL HIGH (ref 0–37)
AST: 113 U/L — ABNORMAL HIGH (ref 0–37)
Albumin: 3.3 g/dL — ABNORMAL LOW (ref 3.5–5.2)
Albumin: 3.2 g/dL — ABNORMAL LOW (ref 3.5–5.2)
Alkaline Phosphatase: 90 U/L (ref 39–117)
BUN: 14 mg/dL (ref 6–23)
BUN: 10 mg/dL (ref 6–23)
CO2: 20 mEq/L (ref 19–32)
Calcium: 8.9 mg/dL (ref 8.4–10.5)
Chloride: 102 mEq/L (ref 96–112)
Creatinine, Ser: 0.84 mg/dL (ref 0.50–1.35)
Creatinine, Ser: 0.69 mg/dL (ref 0.50–1.35)
GFR calc Af Amer: >90 mL/min (ref >90)
GFR calc non Af Amer: >90 mL/min (ref >90)
Glucose, Bld: 114 mg/dL — ABNORMAL HIGH (ref 70–99)
Potassium: 5.2 mEq/L — ABNORMAL HIGH (ref 3.5–5.1)
Sodium: 132 mEq/L — ABNORMAL LOW (ref 135–145)
Total Bilirubin: 1.2 mg/dL (ref 0.3–1.2)
Total Protein: 7.4 g/dL (ref 6.0–8.3)
Total Protein: 7.2 g/dL (ref 6.0–8.3)

## 2011-03-04 LAB — RAPID URINE DRUG SCREEN, HOSP PERFORMED
Amphetamines: NOT DETECTED
Benzodiazepines: NOT DETECTED
Cocaine: NOT DETECTED
Opiates: POSITIVE — AB
Tetrahydrocannabinol: NOT DETECTED

## 2011-03-04 LAB — URINALYSIS, DIPSTICK ONLY
Bilirubin Urine: NEGATIVE
Glucose, UA: NEGATIVE mg/dL
Hgb urine dipstick: NEGATIVE
Ketones, ur: NEGATIVE mg/dL
Leukocytes, UA: NEGATIVE
Nitrite: NEGATIVE
Protein, ur: 30 mg/dL — AB
Specific Gravity, Urine: 1.021 (ref 1.005–1.030)
Urobilinogen, UA: 1 mg/dL (ref 0.0–1.0)
pH: 5.5 (ref 5.0–8.0)

## 2011-03-04 LAB — CBC
HCT: 42.7 % (ref 39.0–52.0)
Hemoglobin: 14.9 g/dL (ref 13.0–17.0)
MCH: 31 pg (ref 26.0–34.0)
MCHC: 34.9 g/dL (ref 30.0–36.0)
MCV: 89 fL (ref 78.0–100.0)
Platelets: 169 10*3/uL (ref 150–400)
RBC: 4.8 MIL/uL (ref 4.22–5.81)
RDW: 12.8 % (ref 11.5–15.5)
WBC: 6.2 10*3/uL (ref 4.0–10.5)

## 2011-03-04 LAB — LIPASE, BLOOD: Lipase: 96 U/L — ABNORMAL HIGH (ref 11–59)

## 2011-03-04 LAB — LIPID PANEL
Cholesterol: 241 mg/dL — ABNORMAL HIGH (ref 0–200)
LDL Cholesterol: UNDETERMINED mg/dL (ref 0–99)
Total CHOL/HDL Ratio: 8.9 RATIO
Triglycerides: 1072 mg/dL — ABNORMAL HIGH (ref <150)
VLDL: UNDETERMINED mg/dL (ref 0–40)

## 2011-03-04 LAB — PHOSPHORUS: Phosphorus: 2.4 mg/dL (ref 2.3–4.6)

## 2011-03-04 SURGERY — POSTERIOR LUMBAR FUSION 2 LEVEL
Anesthesia: General | Laterality: Left

## 2011-03-04 MED ORDER — SODIUM CHLORIDE 0.9 % IV SOLN
INTRAVENOUS | Status: AC
  Administered 2011-03-04: 09:00:00 via INTRAVENOUS

## 2011-03-04 MED ORDER — HYDROMORPHONE HCL PF 1 MG/ML IJ SOLN
INTRAMUSCULAR | Status: AC
  Administered 2011-03-04: 1 mg via INTRAVENOUS
  Filled 2011-03-04: qty 1

## 2011-03-04 MED ORDER — FENOFIBRATE 54 MG PO TABS
54.0000 mg | ORAL_TABLET | Freq: Every day | ORAL | Status: DC
  Administered 2011-03-05: 54 mg via ORAL
  Filled 2011-03-04 (×2): qty 1

## 2011-03-04 MED ORDER — TRAZODONE HCL 100 MG PO TABS
200.0000 mg | ORAL_TABLET | Freq: Every day | ORAL | Status: DC
  Administered 2011-03-04 – 2011-03-05 (×2): 200 mg via ORAL
  Filled 2011-03-04 (×4): qty 2

## 2011-03-04 MED ORDER — IOHEXOL 300 MG/ML  SOLN
20.0000 mL | INTRAMUSCULAR | Status: DC
  Administered 2011-03-04: 20 mL via ORAL

## 2011-03-04 MED ORDER — LISINOPRIL 40 MG PO TABS
40.0000 mg | ORAL_TABLET | Freq: Every day | ORAL | Status: DC
  Filled 2011-03-04: qty 1

## 2011-03-04 MED ORDER — ONDANSETRON HCL 4 MG/2ML IJ SOLN: 4.0000 mg | Freq: Three times a day (TID) | INTRAMUSCULAR | Status: DC | PRN

## 2011-03-04 MED ORDER — SODIUM CHLORIDE 0.9 % IJ SOLN
3.0000 mL | Freq: Two times a day (BID) | INTRAMUSCULAR | Status: DC
  Administered 2011-03-04 – 2011-03-05 (×2): 3 mL via INTRAVENOUS

## 2011-03-04 MED ORDER — HYDRALAZINE HCL 20 MG/ML IJ SOLN
10.0000 mg | Freq: Three times a day (TID) | INTRAMUSCULAR | Status: DC | PRN
  Administered 2011-03-04: 10 mg via INTRAVENOUS
  Filled 2011-03-04: qty 0.5

## 2011-03-04 MED ORDER — HYDROMORPHONE HCL PF 1 MG/ML IJ SOLN
1.0000 mg | INTRAMUSCULAR | Status: DC | PRN
  Administered 2011-03-04 – 2011-03-06 (×20): 1 mg via INTRAVENOUS
  Filled 2011-03-04 (×19): qty 1

## 2011-03-04 MED ORDER — IOHEXOL 300 MG/ML  SOLN
100.0000 mL | Freq: Once | INTRAMUSCULAR | Status: AC | PRN
  Administered 2011-03-04: 100 mL via INTRAVENOUS

## 2011-03-04 MED ORDER — ONDANSETRON HCL 4 MG/2ML IJ SOLN
4.0000 mg | Freq: Once | INTRAMUSCULAR | Status: AC
  Administered 2011-03-04: 4 mg via INTRAVENOUS
  Filled 2011-03-04: qty 2

## 2011-03-04 MED ORDER — HYDROMORPHONE HCL PF 1 MG/ML IJ SOLN
1.0000 mg | Freq: Once | INTRAMUSCULAR | Status: AC
  Administered 2011-03-04: 1 mg via INTRAVENOUS
  Filled 2011-03-04: qty 1

## 2011-03-04 MED ORDER — PANTOPRAZOLE SODIUM 40 MG PO TBEC
40.0000 mg | DELAYED_RELEASE_TABLET | Freq: Every day | ORAL | Status: DC
  Administered 2011-03-05 – 2011-03-06 (×2): 40 mg via ORAL
  Filled 2011-03-04 (×2): qty 1

## 2011-03-04 MED ORDER — LISINOPRIL 20 MG PO TABS
20.0000 mg | ORAL_TABLET | Freq: Once | ORAL | Status: AC
  Administered 2011-03-04: 20 mg via ORAL
  Filled 2011-03-04: qty 1

## 2011-03-04 MED ORDER — ONDANSETRON HCL 4 MG/2ML IJ SOLN: 4.0000 mg | Freq: Four times a day (QID) | INTRAMUSCULAR | Status: DC | PRN

## 2011-03-04 MED ORDER — HYDROMORPHONE HCL PF 1 MG/ML IJ SOLN: 1.0000 mg | INTRAMUSCULAR | Status: DC | PRN

## 2011-03-04 MED ORDER — SODIUM CHLORIDE 0.9 % IV BOLUS (SEPSIS)
1000.0000 mL | Freq: Once | INTRAVENOUS | Status: AC
  Administered 2011-03-04: 1000 mL via INTRAVENOUS

## 2011-03-04 MED ORDER — LISINOPRIL 40 MG PO TABS
40.0000 mg | ORAL_TABLET | Freq: Every day | ORAL | Status: DC
  Administered 2011-03-05 – 2011-03-06 (×2): 40 mg via ORAL
  Filled 2011-03-04 (×2): qty 1

## 2011-03-04 MED ORDER — LISINOPRIL 20 MG PO TABS: 20.0000 mg | ORAL_TABLET | Freq: Every day | ORAL | Status: DC

## 2011-03-04 MED ORDER — POLYETHYLENE GLYCOL 3350 17 G PO PACK
17.0000 g | PACK | Freq: Every day | ORAL | Status: DC | PRN
  Filled 2011-03-04: qty 1

## 2011-03-04 MED ORDER — CLONIDINE HCL 0.3 MG PO TABS
0.3000 mg | ORAL_TABLET | Freq: Every day | ORAL | Status: DC
Start: 2011-03-04 — End: 2011-03-06
  Administered 2011-03-05 – 2011-03-06 (×2): 0.3 mg via ORAL
  Filled 2011-03-04 (×3): qty 1

## 2011-03-04 MED ORDER — SODIUM CHLORIDE 0.9 % IV SOLN
INTRAVENOUS | Status: AC
  Administered 2011-03-04: 07:00:00 via INTRAVENOUS

## 2011-03-04 MED ORDER — HYDROCODONE-ACETAMINOPHEN 5-325 MG PO TABS
1.0000 | ORAL_TABLET | ORAL | Status: DC | PRN
  Administered 2011-03-04: 2 via ORAL
  Filled 2011-03-04: qty 2

## 2011-03-04 MED ORDER — ONDANSETRON HCL 4 MG PO TABS: 4.0000 mg | ORAL_TABLET | Freq: Four times a day (QID) | ORAL | Status: DC | PRN

## 2011-03-04 NOTE — ED Notes (Signed)
PT taking all home meds at this time. (BP, cholesterol, prilosec).

## 2011-03-04 NOTE — H&P (Signed)
PCP:  Elizabeth Palau, MD, MD   DOA:  03/04/2011  1:29 AM  Chief Complaint:  Abdominal pain  HPI: Pt is 56 yo male with PMH outlined below who presented today to [facility] for schedule back surgery and preoperative labs were indicative of pancreatitis. Pt reports having left upper quadrant pain that initially started 1 week prior to admission and has been getting progressively worse. Pain is described as sharp, intermittent, radiating to right side of the abdomen and 7/10 in severity at its worse. There are no specific aggravating or alleviating factors. Pt does reports associated nausea and vomiting, poor oral intake and having similar episodes in the past when he was told he had pancreatitis. He can not remember when the last episode was. Pt denies drinking alcohol recently, last drink being 1 week prior to this event. Pt denies chest pain, shortness of breath, no other abdominal or urinary concerns, no fevers or chills, no illicit substance abuse, no other systemic symptoms.   Allergies: Allergies  Allergen Reactions  . Thorazine (Chlorpromazine Hcl) Other (See Comments)    Body freezes up    Prior to Admission medications   Medication Sig Start Date End Date Taking? Authorizing Provider  cloNIDine (CATAPRES) 0.3 MG tablet Take 0.3 mg by mouth daily.    Yes Historical Provider, MD  fenofibrate (TRICOR) 48 MG tablet Take 48 mg by mouth daily.     Yes Historical Provider, MD  HYDROcodone-acetaminophen (NORCO) 7.5-325 MG per tablet Take 1 tablet by mouth every 8 (eight) hours as needed. For pain   Yes Historical Provider, MD  lisinopril (PRINIVIL,ZESTRIL) 20 MG tablet Take 20 mg by mouth daily.     Yes Historical Provider, MD  omeprazole (PRILOSEC) 20 MG capsule Take 20 mg by mouth 2 (two) times daily.    Yes Historical Provider, MD  traZODone (DESYREL) 100 MG tablet Take 200 mg by mouth at bedtime.     Yes Historical Provider, MD    Past Medical History  Diagnosis Date  . Hypertension     . H/O ETOH abuse   . Colitis   . GERD (gastroesophageal reflux disease)   . Schizophrenia, schizo-affective   . Schizo affective schizophrenia     TAKES ABILIFY  IF  NEEDED  . Hepatitis     HEP  C  . Cocaine abuse     HISTORY OF  . 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 Diverticulitis     H/O    Past Surgical History  Procedure Date  . Eye surgery   . Back surgery     8 MTHS AGO  . Left achilles   . Tonsillectomy   . Knee arthroscopy     Social History:  reports that he quit smoking about 4 years ago. His smoking use included Cigarettes. He has a 30 pack-year smoking history. He does not have any smokeless tobacco history on file. He reports that he drinks alcohol. He reports that he does not use illicit drugs.  No family history on file.  Review of Systems:  Constitutional: Denies fever, chills, diaphoresis, fatigue.  HEENT: Denies photophobia, eye pain, redness, hearing loss, ear pain, congestion, sore throat, rhinorrhea, sneezing, mouth sores, trouble swallowing, neck pain, neck stiffness and tinnitus.   Respiratory: Denies SOB, DOE, cough, chest tightness,  and wheezing.   Cardiovascular: Denies chest pain, palpitations and leg swelling.  Gastrointestinal: Denies diarrhea, constipation, blood in stool and abdominal distention.  Genitourinary: Denies dysuria, urgency, frequency, hematuria,  flank pain and difficulty urinating.  Musculoskeletal: Denies myalgias, back pain, joint swelling, arthralgias and gait problem.  Skin: Denies pallor, rash and wound.  Neurological: Denies dizziness, seizures, syncope, weakness, light-headedness, numbness and headaches.  Hematological: Denies adenopathy. Easy bruising, personal or family bleeding history  Psychiatric/Behavioral: Denies suicidal ideation, mood changes, confusion, nervousness, sleep disturbance and agitation   Physical Exam:  Filed Vitals:   03/04/11 0709 03/04/11 0723 03/04/11 0804 03/04/11  0810  BP: 182/111 165/104 165/93 165/93  Pulse: 92 98  109  Temp:    98.4 F (36.9 C)  TempSrc:    Oral  Resp: 18 16  18   SpO2: 98% 99%  97%    Constitutional: Vital signs reviewed.  Patient is a well-developed and well-nourished in no acute distress and cooperative with exam. Alert and oriented x3.  Head: Normocephalic and atraumatic Ear: TM normal bilaterally Mouth: no erythema or exudates, MMM Eyes: PERRL, EOMI, conjunctivae normal, No scleral icterus.  Neck: Supple, Trachea midline normal ROM, No JVD, mass, thyromegaly, or carotid bruit present.  Cardiovascular: RRR, S1 normal, S2 normal, no MRG, pulses symmetric and intact bilaterally Pulmonary/Chest: CTAB, no wheezes, rales, or rhonchi Abdominal: Soft. LUQ tenderness, non-distended, bowel sounds are normal, no masses, organomegaly, or guarding present.  GU: no CVA tenderness Musculoskeletal: No joint deformities, erythema, or stiffness, ROM full and no nontender Ext: no edema and no cyanosis, pulses palpable bilaterally (DP and PT) Hematology: no cervical, inginal, or axillary adenopathy.  Neurological: A&O x3, Strenght is normal and symmetric bilaterally, cranial nerve II-XII are grossly intact, no focal motor deficit, sensory intact to light touch bilaterally.  Skin: Warm, dry and intact. No rash, cyanosis, or clubbing.  Psychiatric: Normal mood and affect. speech and behavior is normal. Judgment and thought content normal. Cognition and memory are normal.   Labs on Admission:  Results for orders placed during the hospital encounter of 03/04/11 (from the past 48 hour(s))  CBC     Status: Normal   Collection Time   03/04/11  2:30 AM      Component Value Range Comment   WBC 6.2  4.0 - 10.5 (K/uL)    RBC 4.80  4.22 - 5.81 (MIL/uL)    Hemoglobin 14.9  13.0 - 17.0 (g/dL)    HCT 42.7  39.0 - 52.0 (%)    MCV 89.0  78.0 - 100.0 (fL)    MCH 31.0  26.0 - 34.0 (pg)    MCHC 34.9  30.0 - 36.0 (g/dL)    RDW 12.8  11.5 - 15.5 (%)     Platelets 169  150 - 400 (K/uL)   COMPREHENSIVE METABOLIC PANEL     Status: Abnormal   Collection Time   03/04/11  2:30 AM      Component Value Range Comment   Sodium 132 (*) 135 - 145 (mEq/L)    Potassium 5.2 (*) 3.5 - 5.1 (mEq/L) HEMOLYSIS AT THIS LEVEL MAY AFFECT RESULT   Chloride 102  96 - 112 (mEq/L)    CO2 20  19 - 32 (mEq/L)    Glucose, Bld 114 (*) 70 - 99 (mg/dL)    BUN 14  6 - 23 (mg/dL)    Creatinine, Ser 0.84  0.50 - 1.35 (mg/dL)    Calcium 8.9  8.4 - 10.5 (mg/dL)    Total Protein 7.4  6.0 - 8.3 (g/dL)    Albumin 3.3 (*) 3.5 - 5.2 (g/dL)    AST 139 (*) 0 - 37 (U/L)    ALT  99 (*) 0 - 53 (U/L)    Alkaline Phosphatase 90  39 - 117 (U/L) HEMOLYSIS AT THIS LEVEL MAY AFFECT RESULT   Total Bilirubin 1.2  0.3 - 1.2 (mg/dL) HEMOLYSIS AT THIS LEVEL MAY AFFECT RESULT   GFR calc non Af Amer >90  >90 (mL/min)    GFR calc Af Amer >90  >90 (mL/min)   LIPASE, BLOOD     Status: Abnormal   Collection Time   03/04/11  2:30 AM      Component Value Range Comment   Lipase 96 (*) 11 - 59 (U/L)   URINALYSIS, DIPSTICK ONLY     Status: Abnormal   Collection Time   03/04/11  2:42 AM      Component Value Range Comment   Specific Gravity, Urine 1.021  1.005 - 1.030     pH 5.5  5.0 - 8.0     Glucose, UA NEGATIVE  NEGATIVE (mg/dL)    Hgb urine dipstick NEGATIVE  NEGATIVE     Bilirubin Urine NEGATIVE  NEGATIVE     Ketones, ur NEGATIVE  NEGATIVE (mg/dL)    Protein, ur 30 (*) NEGATIVE (mg/dL)    Urobilinogen, UA 1.0  0.0 - 1.0 (mg/dL)    Nitrite NEGATIVE  NEGATIVE     Leukocytes, UA NEGATIVE  NEGATIVE      Radiological Exams on Admission:  CT abdomen and pelvis: IMPRESSION:  Findings in keeping with acute pancreatitis.  Hepatic steatosis.  Distended gallbladder. No radiodense gallstones.  Assessment/Plan  Principal Problem:  *Acute pancreatitis - based on clinical presentation, symptoms, and blood work results of elevated lipase - this is most likely secondary to ongoing alcohol abuse -  will admit to telemetry floor and provide supportive care - will continue IVF, analgesia and antiemetics as needed - keep NPO except meds for now and advance diet as pt tolerating  Active Problems:  SUBSTANCE ABUSE, MULTIPLE - obtain UDS and EtOH level - I have provided counseling for alcohol cessation   HYPERTENSION - secondary to pain and medical noncompliance - will continue Clonidine and increase the dose of Lisinopril - start Hydralazine PRN   Hyperkalemia - repeat BMP   Transaminitis - secondary to alcohol abuse - obtain CMET in AM   HEPATITIS C - chronic - pt is not on any treatment at this time   HYPERLIPIDEMIA - check lipid panel - continue Tricor for now   GERD - continue Protonix   Hyponatremia - secondary to dehydration - continue IVF and check BMP in AM   Hyperglycemia - no history of diabetes - check A1C   DISPOSITION - plan of care and diagnosis, diagnostic studies and test results were discussed with pt and family at bedside - pt and  family verbalized understanding  Time Spent on Admission: Over 30 minutes  MAGICK-Loneta Tamplin 03/04/2011, 8:19 AM  [phone]

## 2011-03-04 NOTE — ED Notes (Signed)
Patient transported to CT 

## 2011-03-04 NOTE — Progress Notes (Signed)
Pt admitted to floor from ED approximately 0830. On arrival pt had BP of 181/123 with a pulse in the low 100s. Pt only had c/o abdominal pain. MD notified of patients arrival to floor. MD ordered cardiac monitoring and was then notified that our floor was not able to accommodate a cardiac telemetry patient. Order was received to transfer patient as well as administer 10mg  of prn IV hydralazine. Pt received both hydralazine and dilaudid at 0900. Pt was transferred to 3700 in stable condition.

## 2011-03-04 NOTE — ED Notes (Signed)
Attempt to call report RN unable to take at this time. Reporting will call back in 10 mins

## 2011-03-04 NOTE — ED Notes (Signed)
CT notified pt is done drinking contrast

## 2011-03-04 NOTE — ED Notes (Signed)
MD made aware of pts BP 

## 2011-03-04 NOTE — ED Provider Notes (Signed)
History    56 year old male with abdominal pain. Left lower quadrant. Gradual onset around noon yesterday. Progressively worsening. Does not radiate. "Just hurts." No fever or chills. No nausea, vomiting or diarrhea. Denies trauma. No urinary complaints. Denies history kidney stones. Denies history of diverticulitis but this was noted on his past medical history. Patient is not a great historian.  CSN: [id number]  Arrival date & time 03/04/11  0116   First MD Initiated Contact with Patient 03/04/11 0205      Chief Complaint  Patient presents with  . Abdominal Pain    (Consider location/radiation/quality/duration/timing/severity/associated sxs/prior treatment) HPI  Past Medical History  Diagnosis Date  . Hypertension   . H/O ETOH abuse   . Colitis   . GERD (gastroesophageal reflux disease)   . Schizophrenia, schizo-affective   . Schizo affective schizophrenia     TAKES ABILIFY  IF  NEEDED  . Hepatitis     HEP  C  . Cocaine abuse     HISTORY OF  . 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 Diverticulitis     H/O    Past Surgical History  Procedure Date  . Eye surgery   . Back surgery     8 MTHS AGO  . Left achilles   . Tonsillectomy   . Knee arthroscopy     No family history on file.  History  Substance Use Topics  . Smoking status: Former Smoker -- 1.0 packs/day for 30 years    Types: Cigarettes    Quit date: 03/03/2007  . Smokeless tobacco: Not on file  . Alcohol Use: Yes      Review of Systems   Review of symptoms negative unless otherwise noted in HPI.   Allergies  Thorazine  Home Medications   Current Outpatient Rx  Name Route Sig Dispense Refill  . CLONIDINE HCL 0.3 MG PO TABS Oral Take 0.3 mg by mouth daily.     . FENOFIBRATE 48 MG PO TABS Oral Take 48 mg by mouth daily.      Marland Kitchen HYDROCODONE-ACETAMINOPHEN 7.5-325 MG PO TABS Oral Take 1 tablet by mouth every 8 (eight) hours as needed. For pain    . LISINOPRIL 20 MG PO TABS  Oral Take 20 mg by mouth daily.      Marland Kitchen OMEPRAZOLE 20 MG PO CPDR Oral Take 20 mg by mouth 2 (two) times daily.     . TRAZODONE HCL 100 MG PO TABS Oral Take 200 mg by mouth at bedtime.        BP 206/119  Pulse 96  Temp 97.4 F (36.3 C)  Resp 20  SpO2 98%  Physical Exam  Nursing note and vitals reviewed. Constitutional: He appears well-developed and well-nourished. No distress.       Sitting in bedside chair. No acute distress. Obese.  HENT:  Head: Normocephalic and atraumatic.  Eyes: Conjunctivae are normal. Right eye exhibits no discharge. Left eye exhibits no discharge.  Neck: Neck supple.  Cardiovascular: Normal rate, regular rhythm and normal heart sounds.  Exam reveals no gallop and no friction rub.   No murmur heard. Pulmonary/Chest: Effort normal and breath sounds normal. No respiratory distress.  Abdominal: Soft. He exhibits no distension. There is tenderness.       Well-healed surgical scar across the upper abdomen. No distention. Moderate tenderness in the left lower quadrant with voluntary guarding. There is no rebound tenderness.  Genitourinary:       No costovertebral angle tenderness.  Musculoskeletal: He exhibits no edema and no tenderness.  Neurological: He is alert.  Skin: Skin is warm and dry.  Psychiatric: He has a normal mood and affect. His behavior is normal. Thought content normal.    ED Course  Procedures (including critical care time)  Labs Reviewed  COMPREHENSIVE METABOLIC PANEL - Abnormal; Notable for the following:    Sodium 132 (*)    Potassium 5.2 (*) HEMOLYSIS AT THIS LEVEL MAY AFFECT RESULT   Glucose, Bld 114 (*)    Albumin 3.3 (*)    AST 139 (*)    ALT 99 (*)    All other components within normal limits  LIPASE, BLOOD - Abnormal; Notable for the following:    Lipase 96 (*)    All other components within normal limits  URINALYSIS, DIPSTICK ONLY - Abnormal; Notable for the following:    Protein, ur 30 (*)    All other components within  normal limits  CBC   Dg Chest 2 View  03/03/2011  *RADIOLOGY REPORT*  Clinical Data: Preop  CHEST - 2 VIEW  Comparison: 02/21/2010  Findings: Cardiomediastinal silhouette is stable.  No acute infiltrate or pleural effusion.  No pulmonary edema.  Stable mild degenerative changes thoracic spine.  IMPRESSION: No active disease.  No significant change.  Original Report Authenticated By: Lahoma Crocker, M.D.   Ct Abdomen Pelvis W Contrast  03/04/2011  *RADIOLOGY REPORT*  Clinical Data: Mid to lower abdominal pain  CT ABDOMEN AND PELVIS WITH CONTRAST  Technique:  Multidetector CT imaging of the abdomen and pelvis was performed following the standard protocol during bolus administration of intravenous contrast.  Contrast: 184mL OMNIPAQUE IOHEXOL 300 MG/ML IV SOLN  Comparison: 04/01/2010  Findings: Limited images through the lung bases demonstrate no significant appreciable abnormality. The heart size is within normal limits. No pleural or pericardial effusion.  Fatty liver.  Distended gallbladder.  No biliary ductal dilatation. Multiloculated cystic lesions within the spleen, similar to prior. The larger cyst demonstrates a septation with calcification.  Stranding about the tail of the pancreas, extends into the left anterior pararenal space.  Homogeneous pancreatic enhancement.  No loculated fluid collection.  Normal adrenal glands.  Symmetric renal enhancement.  No hydronephrosis or hydroureter.  No bowel obstruction.  No CT evidence for colitis. Normal appendix. No free intraperitoneal air.  There is scattered atherosclerotic calcification of the aorta and its branches. No aneurysmal dilatation.  Thin-walled bladder.  No lymphadenopathy.  Multilevel degenerative changes of the imaged spine. No acute or aggressive appearing osseous lesion.  Surgical changes post laminectomy of the lower lumbar spine. Multiple healing facet fractures.  IMPRESSION: Findings in keeping with acute pancreatitis.  Hepatic steatosis.  Distended  gallbladder.  No radiodense gallstones.  Original Report Authenticated By: Suanne Marker, M.D.     1. Pancreatitis       MDM  56 year old male with abdominal pain. Only mild elevation of lipase CT scan is consistent with acute pancreatitis. On further questioning patient said he has been drinking recently. He recently had a birthday and he had some anxiety concerning a surgical procedure which was scheduled for today and has been drinking. CT scan is consistent with the clinical picture. Patient has required multiple dose of IV pain medications. We'll admit for further treatment.        Virgel Manifold, MD 03/12/11 [id number]

## 2011-03-04 NOTE — ED Notes (Signed)
LLQ abdominal pain that started about lunch time today

## 2011-03-04 NOTE — ED Notes (Signed)
PT reported he has starting drinking alcohol again. RN encouraged pt to share this with surgeon.

## 2011-03-04 NOTE — ED Notes (Signed)
Pt also c/o back pain related to a bone spur; he is scheduled for surgery this morning.

## 2011-03-05 LAB — COMPREHENSIVE METABOLIC PANEL
ALT: 56 U/L — ABNORMAL HIGH (ref 0–53)
BUN: 9 mg/dL (ref 6–23)
CO2: 22 mEq/L (ref 19–32)
Calcium: 9 mg/dL (ref 8.4–10.5)
Creatinine, Ser: 0.72 mg/dL (ref 0.50–1.35)
GFR calc Af Amer: >90 mL/min (ref >90)
GFR calc non Af Amer: >90 mL/min (ref >90)
Glucose, Bld: 105 mg/dL — ABNORMAL HIGH (ref 70–99)

## 2011-03-05 LAB — CBC
Hemoglobin: 14 g/dL (ref 13.0–17.0)
MCHC: 33 g/dL (ref 30.0–36.0)
RBC: 4.62 MIL/uL (ref 4.22–5.81)
WBC: 16.6 10*3/uL — ABNORMAL HIGH (ref 4.0–10.5)

## 2011-03-05 LAB — LIPASE, BLOOD: Lipase: 126 U/L — ABNORMAL HIGH (ref 11–59)

## 2011-03-05 LAB — GLUCOSE, CAPILLARY: Glucose-Capillary: 104 mg/dL — ABNORMAL HIGH (ref 70–99)

## 2011-03-05 MED ORDER — SODIUM CHLORIDE 0.9 % IV BOLUS (SEPSIS)
500.0000 mL | Freq: Once | INTRAVENOUS | Status: AC
  Administered 2011-03-05: 500 mL via INTRAVENOUS

## 2011-03-05 MED ORDER — FENOFIBRATE 160 MG PO TABS
160.0000 mg | ORAL_TABLET | Freq: Every day | ORAL | Status: DC
  Administered 2011-03-06: 160 mg via ORAL
  Filled 2011-03-05: qty 1

## 2011-03-05 MED ORDER — LABETALOL HCL 100 MG PO TABS
100.0000 mg | ORAL_TABLET | Freq: Two times a day (BID) | ORAL | Status: DC
  Administered 2011-03-05 – 2011-03-06 (×3): 100 mg via ORAL
  Filled 2011-03-05 (×4): qty 1

## 2011-03-05 MED ORDER — SODIUM CHLORIDE 0.9 % IV SOLN
INTRAVENOUS | Status: DC
  Administered 2011-03-05 – 2011-03-06 (×2): via INTRAVENOUS

## 2011-03-05 NOTE — Progress Notes (Signed)
Patient's heart rate up to 140's sustaining in the 120's.  Dr. Bennett Scrape paged and notified.  New orders received.  Will continue to monitor.  [name] [name]

## 2011-03-05 NOTE — Progress Notes (Addendum)
Patient ID: [name] [name], male   DOB: March 16, 1955, 56 y.o.   MRN: [id number]  Subjective: No events overnight. Patient denies chest pain, shortness of breath, does reports persistent abdominal pain,n ow better controlled on pain medication.  Objective:  Vital signs in last 24 hours:  Filed Vitals:   03/04/11 1024 03/04/11 1417 03/04/11 2048 03/05/11 0500  BP: 156/102 162/98 177/110 161/98  Pulse: 102 106 113 99  Temp: 99 F (37.2 C) 98.8 F (37.1 C) 98.4 F (36.9 C) 100.1 F (37.8 C)  TempSrc:   Oral Oral  Resp: 20 20 24 20   SpO2: 99% 99% 96% 95%    Intake/Output from previous day:  Intake/Output Summary (Last 24 hours) at 03/05/11 1003 Last data filed at 03/05/11 0900  Gross per 24 hour  Intake      0 ml  Output      0 ml  Net      0 ml    Physical Exam: General: Alert, awake, oriented x3, in no acute distress. HEENT: No bruits, no goiter. Moist mucous membranes, no scleral icterus, no conjunctival pallor. Heart: Regular rate and rhythm, S1/S2 +, no murmurs, rubs, gallops. Lungs: Clear to auscultation bilaterally. No wheezing, no rhonchi, no rales.  Abdomen: Soft, LUQ tenderness, nondistended, positive bowel sounds. Extremities: No clubbing or cyanosis, no pitting edema,  positive pedal pulses. Neuro: Grossly nonfocal.  Lab Results:  Basic Metabolic Panel:    Component Value Date/Time   NA 134* 03/05/2011 0529   K 3.7 03/05/2011 0529   CL 103 03/05/2011 0529   CO2 22 03/05/2011 0529   BUN 9 03/05/2011 0529   CREATININE 0.72 03/05/2011 0529   GLUCOSE 105* 03/05/2011 0529   CALCIUM 9.0 03/05/2011 0529   CBC:    Component Value Date/Time   WBC 16.6* 03/05/2011 0529   HGB 14.0 03/05/2011 0529   HCT 42.4 03/05/2011 0529   PLT 132* 03/05/2011 0529   MCV 91.8 03/05/2011 0529   NEUTROABS 3.1 2011-03-04 1327   LYMPHSABS 1.4 03/04/11 1327   MONOABS 0.4 03-04-2011 1327   EOSABS 0.1 03-04-11 1327   BASOSABS 0.0 03-04-11 1327      Lab 03/05/11 0529 03/04/11 0230 2011/03/04 1327  WBC  16.6* 6.2 5.0  HGB 14.0 14.9 15.9  HCT 42.4 42.7 46.0  PLT 132* 169 213  MCV 91.8 89.0 90.2  MCH 30.3 31.0 31.2  MCHC 33.0 34.9 34.6  RDW 13.4 12.8 13.1  LYMPHSABS -- -- 1.4  MONOABS -- -- 0.4  EOSABS -- -- 0.1  BASOSABS -- -- 0.0  BANDABS -- -- --    Lab 03/05/11 0529 03/04/11 0842 03/04/11 0230  NA 134* 132* 132*  K 3.7 4.3 5.2*  CL 103 101 102  CO2 22 18* 20  GLUCOSE 105* 110* 114*  BUN 9 10 14   CREATININE 0.72 0.69 0.84  CALCIUM 9.0 8.8 8.9  MG -- 1.9 --    Lab March 04, 2011 1327  INR 1.02  PROTIME --    Recent Results (from the past 240 hour(s))  SURGICAL PCR SCREEN     Status: Abnormal   Collection Time   Mar 04, 2011  1:27 PM      Component Value Range Status Comment   MRSA, PCR NEGATIVE  NEGATIVE  Final    Staphylococcus aureus POSITIVE (*) NEGATIVE  Final     Studies/Results: Dg Chest 2 View 03/04/2011   IMPRESSION: No active disease.  No significant change.  Original Report Authenticated By: Lahoma Crocker, M.D.  Ct Abdomen Pelvis W Contrast 03/04/2011    IMPRESSION: Findings in keeping with acute pancreatitis.  Hepatic steatosis.  Distended gallbladder.  No radiodense gallstones.  Medications: Scheduled Meds:   . sodium chloride   Intravenous STAT  . cloNIDine  0.3 mg Oral Daily  . fenofibrate  54 mg Oral Daily  . lisinopril  40 mg Oral Daily  . pantoprazole  40 mg Oral Q breakfast  . sodium chloride  500 mL Intravenous Once  . sodium chloride  3 mL Intravenous Q12H  . traZODone  200 mg Oral QHS   Continuous Infusions:   . sodium chloride 125 mL/hr at 03/04/11 0900  . sodium chloride 125 mL/hr at 03/05/11 0941   PRN Meds:.hydrALAZINE, HYDROcodone-acetaminophen, HYDROmorphone, ondansetron (ZOFRAN) IV, ondansetron, polyethylene glycol  Assessment/Plan:  Principal Problem:  *Acute pancreatitis  - based on clinical presentation, symptoms, and blood work results of elevated lipase  - this is most likely secondary to ongoing alcohol abuse along with  hypertriglyceridemia  - continue to provide supportive care  - will continue IVF, analgesia and antiemetics as needed  - keep NPO except meds for now and advance diet as pt tolerating   Active Problems:  SUBSTANCE ABUSE, MULTIPLE  - obtain UDS and EtOH level  - I have provided counseling for alcohol cessation   HYPERTENSION  - secondary to pain and medical noncompliance  - will continue Clonidine and increase the dose of Lisinopril, start Labetalol - start Hydralazine PRN   Hyperkalemia  - resolved  Transaminitis  - secondary to alcohol abuse  - trending down - obtain CMET in AM   HEPATITIS C  - chronic  - pt is not on any treatment at this time   HYPERLIPIDEMIA  - check lipid panel  - continue Tricor for now   GERD  - continue Protonix   Hyponatremia  - secondary to dehydration  - improving - continue IVF and check BMP in AM   Hyperglycemia  - no history of diabetes  - based on A1C pt is not diabetic  DISPOSITION  - plan of care and diagnosis, diagnostic studies and test results were discussed with pt and family at bedside  - pt and family verbalized understanding    LOS: 1 day   MAGICK-Turki Tapanes 03/05/2011, 10:03 AM  [facility] Pager: [phone]

## 2011-03-06 LAB — COMPREHENSIVE METABOLIC PANEL
ALT: 49 U/L (ref 0–53)
AST: 57 U/L — ABNORMAL HIGH (ref 0–37)
Albumin: 2.5 g/dL — ABNORMAL LOW (ref 3.5–5.2)
Alkaline Phosphatase: 87 U/L (ref 39–117)
Alkaline Phosphatase: 125 U/L — ABNORMAL HIGH (ref 39–117)
BUN: 10 mg/dL (ref 6–23)
CO2: 20 mEq/L (ref 19–32)
GFR calc Af Amer: >90 mL/min (ref >90)
GFR calc non Af Amer: >90 mL/min (ref >90)
Glucose, Bld: 138 mg/dL — ABNORMAL HIGH (ref 70–99)
Potassium: 3.7 mEq/L (ref 3.5–5.1)
Potassium: 3.5 mEq/L (ref 3.5–5.1)
Sodium: 131 mEq/L — ABNORMAL LOW (ref 135–145)
Total Protein: 6.4 g/dL (ref 6.0–8.3)

## 2011-03-06 LAB — LIPASE, BLOOD: Lipase: 61 U/L — ABNORMAL HIGH (ref 11–59)

## 2011-03-06 LAB — CBC
HCT: 34 % — ABNORMAL LOW (ref 39.0–52.0)
MCV: 92.9 fL (ref 78.0–100.0)
RBC: 3.66 MIL/uL — ABNORMAL LOW (ref 4.22–5.81)
RDW: 13.5 % (ref 11.5–15.5)
WBC: 13.7 10*3/uL — ABNORMAL HIGH (ref 4.0–10.5)

## 2011-03-06 MED ORDER — FENOFIBRATE 160 MG PO TABS: 160.0000 mg | ORAL_TABLET | Freq: Every day | ORAL | Status: DC

## 2011-03-06 MED ORDER — LABETALOL HCL 100 MG PO TABS: 100.0000 mg | ORAL_TABLET | Freq: Two times a day (BID) | ORAL | Status: DC

## 2011-03-06 MED ORDER — HYDROMORPHONE HCL 2 MG PO TABS: 1.0000 mg | ORAL_TABLET | ORAL | Status: AC | PRN

## 2011-03-06 MED ORDER — CLONIDINE HCL 0.3 MG PO TABS: 0.3000 mg | ORAL_TABLET | Freq: Every day | ORAL | Status: DC

## 2011-03-06 MED ORDER — TRAZODONE HCL 100 MG PO TABS: 200.0000 mg | ORAL_TABLET | Freq: Every day | ORAL | Status: DC

## 2011-03-06 MED ORDER — HYDROMORPHONE HCL 2 MG PO TABS
1.0000 mg | ORAL_TABLET | ORAL | Status: DC | PRN
  Administered 2011-03-06 (×2): 1 mg via ORAL
  Filled 2011-03-06 (×2): qty 1

## 2011-03-06 MED ORDER — LISINOPRIL 40 MG PO TABS: 40.0000 mg | ORAL_TABLET | Freq: Every day | ORAL | Status: DC

## 2011-03-06 MED ORDER — ONDANSETRON HCL 4 MG PO TABS: 4.0000 mg | ORAL_TABLET | Freq: Four times a day (QID) | ORAL | Status: AC | PRN

## 2011-03-06 NOTE — Progress Notes (Signed)
Reviewed discharge instructions with patient and sister, they stated their understanding.  Patient and sister asked about possible follow up in [facility] down stairs and follow up with surgeon.  Paged Dr. Bennett Scrape, returned call after patient discharged.  Left message on patient's answering machine to call surgeon on Monday and for him to call Clinic to try to get an appointment.  Sanda Linger

## 2011-03-06 NOTE — Discharge Summary (Signed)
Patient ID: [name] [name] MRN: [id number] DOB/AGE: December 03, 1955 56 y.o.  Admit date: 03/04/2011 Discharge date: 03/06/2011  Primary Care Physician:  Elizabeth Palau, MD, MD  Discharge Diagnoses:    Present on Admission:  .SUBSTANCE ABUSE, MULTIPLE .HYPERTENSION .HYPERLIPIDEMIA .HEPATITIS C .GERD .Acute pancreatitis .Hyperkalemia .Hyponatremia .Transaminitis .Hyperglycemia  Principal Problem:  *Acute pancreatitis Active Problems:  SUBSTANCE ABUSE, MULTIPLE  HYPERTENSION  Hyperkalemia  Transaminitis  HEPATITIS C  HYPERLIPIDEMIA  GERD  Hyponatremia  Hyperglycemia   Medication List  As of 03/06/2011  9:54 AM   ASK your doctor about these medications         cloNIDine 0.3 MG tablet   Commonly known as: CATAPRES   Take 0.3 mg by mouth daily.      fenofibrate 48 MG tablet   Commonly known as: TRICOR   Take 48 mg by mouth daily.      HYDROcodone-acetaminophen 7.5-325 MG per tablet   Commonly known as: NORCO   Take 1 tablet by mouth every 8 (eight) hours as needed. For pain      lisinopril 20 MG tablet   Commonly known as: PRINIVIL,ZESTRIL   Take 20 mg by mouth daily.      omeprazole 20 MG capsule   Commonly known as: PRILOSEC   Take 20 mg by mouth 2 (two) times daily.      traZODone 100 MG tablet   Commonly known as: DESYREL   Take 200 mg by mouth at bedtime.            Disposition and Follow-up: PCP in 2 weeks  Consults:  none  Significant Diagnostic Studies:  Ct Abdomen Pelvis W Contrast 03/04/2011   IMPRESSION: Findings in keeping with acute pancreatitis.  Hepatic steatosis.  Distended gallbladder.  No radiodense gallstones.    Brief H and P: Pt is 56 yo male with PMH outlined below who presented today to [facility] for schedule back surgery and preoperative labs were indicative of pancreatitis. Pt reports having left upper quadrant pain that initially started 1 week prior to admission and has been getting progressively worse. Pain is described as  sharp, intermittent, radiating to right side of the abdomen and 7/10 in severity at its worse. There are no specific aggravating or alleviating factors. Pt does reports associated nausea and vomiting, poor oral intake and having similar episodes in the past when he was told he had pancreatitis. He can not remember when the last episode was. Pt denies drinking alcohol recently, last drink being 1 week prior to this event. Pt denies chest pain, shortness of breath, no other abdominal or urinary concerns, no fevers or chills, no illicit substance abuse, no other systemic symptoms.   Physical Exam on Discharge:  Filed Vitals:   03/05/11 1350 03/05/11 2100 03/06/11 0500 03/06/11 0929  BP:  176/100 161/80 161/90  Pulse:  104 92   Temp:  98.7 F (37.1 C) 99.2 F (37.3 C)   TempSrc:  Oral Oral   Resp:  20 20   Height: 5\' 11"  (1.803 m)     Weight: 114.1 kg (251 lb 8.7 oz)     SpO2:  95% 97%      Intake/Output Summary (Last 24 hours) at 03/06/11 0954 Last data filed at 03/06/11 0500  Gross per 24 hour  Intake    500 ml  Output      0 ml  Net    500 ml    General: Alert, awake, oriented x3, in no acute distress. HEENT: No bruits,  no goiter. Heart: Regular rate and rhythm, without murmurs, rubs, gallops. Lungs: Clear to auscultation bilaterally. Abdomen: Soft, nontender, nondistended, positive bowel sounds. Extremities: No clubbing cyanosis or edema with positive pedal pulses. Neuro: Grossly intact, nonfocal.  CBC:    Component Value Date/Time   WBC 13.7* 03/06/2011 0542   HGB 11.2* 03/06/2011 0542   HCT 34.0* 03/06/2011 0542   PLT 130* 03/06/2011 0542   MCV 92.9 03/06/2011 0542   NEUTROABS 3.1 03/03/2011 1327   LYMPHSABS 1.4 03/03/2011 1327   MONOABS 0.4 03/03/2011 1327   EOSABS 0.1 03/03/2011 1327   BASOSABS 0.0 03/03/2011 [id number]    Basic Metabolic Panel:    Component Value Date/Time   NA 134* 03/06/2011 0542   K 3.7 03/06/2011 0542   CL 103 03/06/2011 0542   CO2 22 03/06/2011 0542   BUN 10 03/06/2011  0542   CREATININE 0.73 03/06/2011 0542   GLUCOSE 104* 03/06/2011 0542   CALCIUM 9.1 03/06/2011 0542    Hospital Course:  Principal Problem:  *Acute pancreatitis  - based on clinical presentation, symptoms, and blood work results of elevated lipase  - this is most likely secondary to ongoing alcohol abuse along with hypertriglyceridemia  - pt responded well to medical management - diet was advance and pt has tolerated well  Active Problems:  SUBSTANCE ABUSE, MULTIPLE  - I have provided counseling for alcohol cessation   HYPERTENSION  - secondary to pain and medical noncompliance  - will continued Clonidine and increased the dose of Lisinopril, started Labetalol  - BP was well controlled during the stay  Hyperkalemia  - resolved   Transaminitis  - secondary to alcohol abuse  - trending down   HEPATITIS C  - chronic  - pt is not on any treatment at this time   HYPERLIPIDEMIA  - continue Tricor for now   GERD  - continue Protonix   Hyponatremia  - secondary to dehydration  - improved  Hyperglycemia  - no history of diabetes  - based on A1C pt is not diabetic   DISPOSITION  - plan of care and diagnosis, diagnostic studies and test results were discussed with pt and family at bedside  - pt and family verbalized understanding   Time spent on Discharge: Over 30 minutes  Signed: Faye Ramsay 03/06/2011, 9:54 AM  [phone]

## 2011-03-18 ENCOUNTER — Encounter (HOSPITAL_COMMUNITY): Payer: Self-pay

## 2011-03-18 ENCOUNTER — Other Ambulatory Visit: Payer: Self-pay | Admitting: Orthopedic Surgery

## 2011-03-19 ENCOUNTER — Encounter (HOSPITAL_COMMUNITY)
Admission: RE | Admit: 2011-03-19 | Discharge: 2011-03-19 | Disposition: A | Discharge status: Home / Self Care | Payer: PRIVATE HEALTH INSURANCE | Source: Ambulatory Visit | Attending: Orthopedic Surgery | Admitting: Orthopedic Surgery

## 2011-03-19 ENCOUNTER — Encounter (HOSPITAL_COMMUNITY): Payer: Self-pay

## 2011-03-19 LAB — CBC
MCH: 30.2 pg (ref 26.0–34.0)
MCHC: 32.5 g/dL (ref 30.0–36.0)
MCV: 92.7 fL (ref 78.0–100.0)
Platelets: 383 10*3/uL (ref 150–400)

## 2011-03-19 LAB — DIFFERENTIAL
Basophils Absolute: 0.1 10*3/uL (ref 0.0–0.1)
Basophils Relative: 1 % (ref 0–1)
Eosinophils Relative: 2 % (ref 0–5)
Lymphocytes Relative: 28 % (ref 12–46)

## 2011-03-19 LAB — COMPREHENSIVE METABOLIC PANEL
ALT: 54 U/L — ABNORMAL HIGH (ref 0–53)
CO2: 25 mEq/L (ref 19–32)
Calcium: 9.7 mg/dL (ref 8.4–10.5)
Creatinine, Ser: 0.89 mg/dL (ref 0.50–1.35)
GFR calc Af Amer: >90 mL/min (ref >90)
GFR calc non Af Amer: >90 mL/min (ref >90)
Glucose, Bld: 89 mg/dL (ref 70–99)
Total Bilirubin: 0.2 mg/dL — ABNORMAL LOW (ref 0.3–1.2)

## 2011-03-19 LAB — PROTIME-INR: Prothrombin Time: 14.6 seconds (ref 11.6–15.2)

## 2011-03-19 MED ORDER — DEXTROSE-NACL 5-0.45 % IV SOLN: INTRAVENOUS | Status: DC

## 2011-03-19 MED ORDER — CEFAZOLIN SODIUM 1-5 GM-% IV SOLN
1.0000 g | INTRAVENOUS | Status: DC
  Filled 2011-03-19: qty 50

## 2011-03-19 MED ORDER — MUPIROCIN 2 % EX OINT
TOPICAL_OINTMENT | CUTANEOUS | Status: AC
  Filled 2011-03-19: qty 22

## 2011-03-19 MED ORDER — CHLORHEXIDINE GLUCONATE 4 % EX LIQD
60.0000 mL | Freq: Once | CUTANEOUS | Status: DC
  Filled 2011-03-19: qty 60

## 2011-03-19 NOTE — H&P (Signed)
Subjective: Patient had left Achilles tendon repair by me over 6 years ago.  One week ago had swelling and pain over the repair site, but no drainage and the wound remains healed.  A few days prior to that he had been discharged from the hospital after a bout of pancreatitis that required a four-day stay.  He denies any fever or chills and says that the swelling over the Achilles tendon waxes and wanes.  He is able to walk without a crutch but it causes discomfort over the Achilles tendon.  PAST MEDICAL HX: HTN, pancreatitis, Hep C, hyperlipidemia, schizophrenia  PSH: left achilles tendon repair  Allergic to thorazine.  Social Hx: Former smoker, Drinks alcohol  [id number] denies dizziness, nausea, fever, chills, vomiting, shortness of breath, chest pain, loss of appetite, or rash.    Objective: Well-nourished well-developed patient seated on the exam table in no obvious distress no shortness of breath. Awake, alert, and oriented x3.  Extraocular motion is intact.  No use of accessory respiratory muscles for breathing.   Cardiovascular exam reveals a regular rhythm.  Skin is intact without cuts, scrapes, or abrasions.  The patient is afebrile.  There is 2+ swelling over the left Achilles tendon repair site.  The wound is healed.  There is a palpable fluid collection over the Achilles repair site.  After discussing options with the patient I sterilely prepped the site with alcohol and he is ethyl chloride as an anesthetic.  I aspirated about 1 cc of green purulent material that was sent off for Gram stain and culture.  A 4 x 4 and an Ace wrap was placed over the aspiration site and he was placed in a cam walker boot for immobilization.  I did not do any x-rays.  Since the area of swelling was about 2 inches above the Achilles insertion.  The calcaneus is nontender.  Asses: Superficial infection, left Achilles repair site after a bout of pancreatitis the original repair was done 6 years ago.  Plan:  Keflex 500 mg by mouth 4 times a day, await results of the Gram stain and culture, we will set him up for irrigation and debridement at the day surgery center and asked couple of days.  He'll come back sooner if he has increasing pain and/or fever.  He will rest his left lower extremity as much as possible.

## 2011-03-19 NOTE — Pre-Procedure Instructions (Signed)
20 [name] [name]  03/19/2011   Your procedure is scheduled on:  Friday  03/20/11    Report to [facility] at 1030  AM.  Call this number if you have problems the morning of surgery: [phone]   Remember:   Do not eat food:After Midnight.  May have clear liquids: up to 4 Hours before arrival.  Clear liquids include soda, tea, black coffee, apple or grape juice, broth.  Take these medicines the morning of surgery with A SIP OF WATER:  Clonidine(catapres), hydrocodone, labetalol, prilosec    Do not wear jewelry, make-up or nail polish.  Do not wear lotions, powders, or perfumes. You may wear deodorant.  Do not shave 48 hours prior to surgery.  Do not bring valuables to the hospital.  Contacts, dentures or bridgework may not be worn into surgery.  Leave suitcase in the car. After surgery it may be brought to your room.  For patients admitted to the hospital, checkout time is 11:00 AM the day of discharge.   Patients discharged the day of surgery will not be allowed to drive home.  Name and phone number of your driver:   Special Instructions: CHG Shower Use Special Wash: 1/2 bottle night before surgery and 1/2 bottle morning of surgery.   Please read over the following fact sheets that you were given: Pain Booklet, MRSA Information and Surgical Site Infection Prevention

## 2011-03-20 ENCOUNTER — Encounter (HOSPITAL_COMMUNITY): Payer: Self-pay | Admitting: Certified Registered"

## 2011-03-20 ENCOUNTER — Encounter (HOSPITAL_COMMUNITY): Payer: Self-pay | Admitting: *Deleted

## 2011-03-20 ENCOUNTER — Encounter (HOSPITAL_COMMUNITY)
Admission: RE | Disposition: A | Discharge status: Home / Self Care | Payer: Self-pay | Source: Ambulatory Visit | Attending: Orthopedic Surgery

## 2011-03-20 ENCOUNTER — Observation Stay (HOSPITAL_COMMUNITY)
Admission: RE | Admit: 2011-03-20 | Discharge: 2011-03-21 | Disposition: A | Discharge status: Home / Self Care | Payer: PRIVATE HEALTH INSURANCE | Source: Ambulatory Visit | Attending: Orthopedic Surgery | Admitting: Orthopedic Surgery

## 2011-03-20 ENCOUNTER — Ambulatory Visit (HOSPITAL_COMMUNITY): Payer: PRIVATE HEALTH INSURANCE | Admitting: Certified Registered"

## 2011-03-20 DIAGNOSIS — Z01812 Encounter for preprocedural laboratory examination: Secondary | ICD-10-CM | POA: Insufficient documentation

## 2011-03-20 DIAGNOSIS — L089 Local infection of the skin and subcutaneous tissue, unspecified: Secondary | ICD-10-CM

## 2011-03-20 DIAGNOSIS — I10 Essential (primary) hypertension: Secondary | ICD-10-CM | POA: Insufficient documentation

## 2011-03-20 DIAGNOSIS — B1920 Unspecified viral hepatitis C without hepatic coma: Secondary | ICD-10-CM | POA: Insufficient documentation

## 2011-03-20 DIAGNOSIS — Z01811 Encounter for preprocedural respiratory examination: Secondary | ICD-10-CM | POA: Insufficient documentation

## 2011-03-20 DIAGNOSIS — L02419 Cutaneous abscess of limb, unspecified: Principal | ICD-10-CM | POA: Insufficient documentation

## 2011-03-20 DIAGNOSIS — T148XXA Other injury of unspecified body region, initial encounter: Secondary | ICD-10-CM

## 2011-03-20 HISTORY — PX: I & D EXTREMITY: SHX5045

## 2011-03-20 SURGERY — IRRIGATION AND DEBRIDEMENT EXTREMITY
Anesthesia: General | Site: Foot | Laterality: Left | Wound class: Dirty or Infected

## 2011-03-20 MED ORDER — HYDROMORPHONE HCL PF 1 MG/ML IJ SOLN
0.2500 mg | INTRAMUSCULAR | Status: DC | PRN
  Administered 2011-03-20 (×2): 0.5 mg via INTRAVENOUS

## 2011-03-20 MED ORDER — LACTATED RINGERS IV SOLN
INTRAVENOUS | Status: DC | PRN
  Administered 2011-03-20: 12:00:00 via INTRAVENOUS

## 2011-03-20 MED ORDER — KCL IN DEXTROSE-NACL 20-5-0.45 MEQ/L-%-% IV SOLN
INTRAVENOUS | Status: DC
  Administered 2011-03-20: 16:00:00 via INTRAVENOUS
  Filled 2011-03-20 (×4): qty 1000

## 2011-03-20 MED ORDER — LABETALOL HCL 100 MG PO TABS
100.0000 mg | ORAL_TABLET | Freq: Two times a day (BID) | ORAL | Status: DC
  Administered 2011-03-20 – 2011-03-21 (×2): 100 mg via ORAL
  Filled 2011-03-20 (×3): qty 1

## 2011-03-20 MED ORDER — SODIUM CHLORIDE 0.9 % IR SOLN
Status: DC | PRN
  Administered 2011-03-20: 1000 mL

## 2011-03-20 MED ORDER — FENTANYL CITRATE 0.05 MG/ML IJ SOLN
INTRAMUSCULAR | Status: DC | PRN
  Administered 2011-03-20 (×2): 25 ug via INTRAVENOUS
  Administered 2011-03-20: 100 ug via INTRAVENOUS
  Administered 2011-03-20: 25 ug via INTRAVENOUS

## 2011-03-20 MED ORDER — PANTOPRAZOLE SODIUM 40 MG PO TBEC: 40.0000 mg | DELAYED_RELEASE_TABLET | Freq: Every day | ORAL | Status: DC

## 2011-03-20 MED ORDER — HYDROMORPHONE HCL PF 1 MG/ML IJ SOLN
1.0000 mg | INTRAMUSCULAR | Status: DC | PRN
  Administered 2011-03-20 – 2011-03-21 (×3): 1 mg via INTRAVENOUS
  Filled 2011-03-20 (×3): qty 1

## 2011-03-20 MED ORDER — CEFAZOLIN SODIUM-DEXTROSE 2-3 GM-% IV SOLR
INTRAVENOUS | Status: AC
  Filled 2011-03-20: qty 50

## 2011-03-20 MED ORDER — ONDANSETRON HCL 4 MG/2ML IJ SOLN: 4.0000 mg | Freq: Four times a day (QID) | INTRAMUSCULAR | Status: DC | PRN

## 2011-03-20 MED ORDER — DROPERIDOL 2.5 MG/ML IJ SOLN: 0.6250 mg | INTRAMUSCULAR | Status: DC | PRN

## 2011-03-20 MED ORDER — LISINOPRIL 20 MG PO TABS
20.0000 mg | ORAL_TABLET | Freq: Every day | ORAL | Status: DC
  Administered 2011-03-20 – 2011-03-21 (×2): 20 mg via ORAL
  Filled 2011-03-20 (×2): qty 1

## 2011-03-20 MED ORDER — CEPHALEXIN 500 MG PO CAPS
500.0000 mg | ORAL_CAPSULE | Freq: Four times a day (QID) | ORAL | Status: DC
Start: 2011-03-20 — End: 2011-03-21
  Administered 2011-03-20 – 2011-03-21 (×3): 500 mg via ORAL
  Filled 2011-03-20 (×6): qty 1

## 2011-03-20 MED ORDER — DOCUSATE SODIUM 100 MG PO CAPS
100.0000 mg | ORAL_CAPSULE | Freq: Two times a day (BID) | ORAL | Status: DC
  Administered 2011-03-20 – 2011-03-21 (×2): 100 mg via ORAL
  Filled 2011-03-20 (×3): qty 1

## 2011-03-20 MED ORDER — TRAZODONE HCL 100 MG PO TABS
200.0000 mg | ORAL_TABLET | Freq: Every day | ORAL | Status: DC
  Administered 2011-03-20: 200 mg via ORAL
  Filled 2011-03-20 (×2): qty 2

## 2011-03-20 MED ORDER — CLONIDINE HCL 0.3 MG PO TABS
0.3000 mg | ORAL_TABLET | Freq: Every day | ORAL | Status: DC
  Administered 2011-03-21: 0.3 mg via ORAL
  Filled 2011-03-20 (×2): qty 1

## 2011-03-20 MED ORDER — PROPOFOL 10 MG/ML IV EMUL
INTRAVENOUS | Status: DC | PRN
  Administered 2011-03-20: 200 mL via INTRAVENOUS

## 2011-03-20 MED ORDER — CEFAZOLIN SODIUM-DEXTROSE 2-3 GM-% IV SOLR: 2.0000 g | INTRAVENOUS | Status: DC

## 2011-03-20 MED ORDER — HYDROCODONE-ACETAMINOPHEN 5-325 MG PO TABS: 1.0000 | ORAL_TABLET | ORAL | Status: AC | PRN

## 2011-03-20 MED ORDER — HYDROCODONE-ACETAMINOPHEN 5-325 MG PO TABS
1.0000 | ORAL_TABLET | Freq: Four times a day (QID) | ORAL | Status: DC | PRN
  Administered 2011-03-21: 2 via ORAL
  Filled 2011-03-20: qty 2

## 2011-03-20 MED ORDER — FENOFIBRATE 54 MG PO TABS
54.0000 mg | ORAL_TABLET | Freq: Every day | ORAL | Status: DC
  Administered 2011-03-20 – 2011-03-21 (×2): 54 mg via ORAL
  Filled 2011-03-20 (×2): qty 1

## 2011-03-20 MED ORDER — ONDANSETRON HCL 4 MG/2ML IJ SOLN
INTRAMUSCULAR | Status: DC | PRN
  Administered 2011-03-20: 4 mg via INTRAVENOUS

## 2011-03-20 MED ORDER — MIDAZOLAM HCL 5 MG/5ML IJ SOLN
INTRAMUSCULAR | Status: DC | PRN
  Administered 2011-03-20: 2 mg via INTRAVENOUS

## 2011-03-20 MED FILL — Mupirocin Oint 2%: CUTANEOUS | Qty: 22 | Status: AC

## 2011-03-20 SURGICAL SUPPLY — 45 items
BANDAGE ELASTIC 4 VELCRO ST LF (GAUZE/BANDAGES/DRESSINGS) ×2 IMPLANT
BANDAGE ELASTIC 6 VELCRO ST LF (GAUZE/BANDAGES/DRESSINGS) ×2 IMPLANT
BANDAGE GAUZE ELAST BULKY 4 IN (GAUZE/BANDAGES/DRESSINGS) ×2 IMPLANT
BLADE SURG 10 STRL SS (BLADE) ×2 IMPLANT
BNDG COHESIVE 4X5 TAN STRL (GAUZE/BANDAGES/DRESSINGS) ×1 IMPLANT
CLOTH BEACON ORANGE TIMEOUT ST (SAFETY) ×2 IMPLANT
COVER SURGICAL LIGHT HANDLE (MISCELLANEOUS) ×2 IMPLANT
CUFF TOURNIQUET SINGLE 18IN (TOURNIQUET CUFF) ×1 IMPLANT
CUFF TOURNIQUET SINGLE 24IN (TOURNIQUET CUFF) IMPLANT
CUFF TOURNIQUET SINGLE 34IN LL (TOURNIQUET CUFF) ×1 IMPLANT
CUFF TOURNIQUET SINGLE 44IN (TOURNIQUET CUFF) IMPLANT
DRAIN TLS ROUND 10FR (DRAIN) ×1 IMPLANT
DURAPREP 26ML APPLICATOR (WOUND CARE) ×2 IMPLANT
ELECT REM PT RETURN 9FT ADLT (ELECTROSURGICAL) ×2
ELECTRODE REM PT RTRN 9FT ADLT (ELECTROSURGICAL) IMPLANT
EVACUATOR 1/8 PVC DRAIN (DRAIN) IMPLANT
FACESHIELD LNG OPTICON STERILE (SAFETY) ×2 IMPLANT
GAUZE XEROFORM 1X8 LF (GAUZE/BANDAGES/DRESSINGS) ×2 IMPLANT
GLOVE BIO SURGEON STRL SZ7 (GLOVE) ×2 IMPLANT
GLOVE BIO SURGEON STRL SZ7.5 (GLOVE) ×2 IMPLANT
GLOVE BIOGEL PI IND STRL 7.0 (GLOVE) ×1 IMPLANT
GLOVE BIOGEL PI IND STRL 8 (GLOVE) ×1 IMPLANT
GLOVE BIOGEL PI INDICATOR 7.0 (GLOVE) ×1
GLOVE BIOGEL PI INDICATOR 8 (GLOVE) ×1
GOWN STRL NON-REIN LRG LVL3 (GOWN DISPOSABLE) ×6 IMPLANT
HANDPIECE INTERPULSE COAX TIP (DISPOSABLE)
KIT BASIN OR (CUSTOM PROCEDURE TRAY) ×2 IMPLANT
KIT ROOM TURNOVER OR (KITS) ×2 IMPLANT
MANIFOLD NEPTUNE II (INSTRUMENTS) ×2 IMPLANT
NS IRRIG 1000ML POUR BTL (IV SOLUTION) ×2 IMPLANT
PACK ORTHO EXTREMITY (CUSTOM PROCEDURE TRAY) ×2 IMPLANT
PAD ARMBOARD 7.5X6 YLW CONV (MISCELLANEOUS) ×4 IMPLANT
PENCIL BUTTON HOLSTER BLD 10FT (ELECTRODE) IMPLANT
SET HNDPC FAN SPRY TIP SCT (DISPOSABLE) IMPLANT
SPONGE GAUZE 4X4 12PLY (GAUZE/BANDAGES/DRESSINGS) ×2 IMPLANT
SPONGE LAP 18X18 X RAY DECT (DISPOSABLE) ×2 IMPLANT
STOCKINETTE IMPERVIOUS 9X36 MD (GAUZE/BANDAGES/DRESSINGS) ×1 IMPLANT
SUT ETHILON 3 0 PS 1 (SUTURE) IMPLANT
TOWEL OR 17X24 6PK STRL BLUE (TOWEL DISPOSABLE) ×2 IMPLANT
TOWEL OR 17X26 10 PK STRL BLUE (TOWEL DISPOSABLE) ×2 IMPLANT
TUBE ANAEROBIC SPECIMEN COL (MISCELLANEOUS) IMPLANT
TUBE CONNECTING 12X1/4 (SUCTIONS) ×2 IMPLANT
UNDERPAD 30X30 INCONTINENT (UNDERPADS AND DIAPERS) ×2 IMPLANT
WATER STERILE IRR 1000ML POUR (IV SOLUTION) ×2 IMPLANT
YANKAUER SUCT BULB TIP NO VENT (SUCTIONS) ×2 IMPLANT

## 2011-03-20 NOTE — Anesthesia Preprocedure Evaluation (Signed)
Anesthesia Evaluation   Patient awake    Reviewed: Allergy & Precautions, H&P , NPO status , Patient's Chart, lab work & pertinent test results  History of Anesthesia Complications Negative for: history of anesthetic complications  Airway Mallampati: II  Neck ROM: Full    Dental  (+) Dental Advisory Given   Pulmonary neg pulmonary ROS,    Pulmonary exam normal       Cardiovascular hypertension, Pt. on home beta blockers     Neuro/Psych PSYCHIATRIC DISORDERS Schizophrenia    GI/Hepatic GERD-  Controlled,(+) Hepatitis -, C  Endo/Other  Negative Endocrine ROS  Renal/GU negative Renal ROS     Musculoskeletal   Abdominal   Peds  Hematology negative hematology ROS (+)   Anesthesia Other Findings   Reproductive/Obstetrics                           Anesthesia Physical Anesthesia Plan  ASA: III  Anesthesia Plan: General   Post-op Pain Management:    Induction: Intravenous  Airway Management Planned: Oral ETT  Additional Equipment:   Intra-op Plan:   Post-operative Plan: Extubation in OR  Informed Consent: I have reviewed the patients History and Physical, chart, labs and discussed the procedure including the risks, benefits and alternatives for the proposed anesthesia with the patient or authorized representative who has indicated his/her understanding and acceptance.   Dental advisory given  Plan Discussed with: CRNA, Anesthesiologist and Surgeon  Anesthesia Plan Comments:         Anesthesia Quick Evaluation

## 2011-03-20 NOTE — Transfer of Care (Signed)
Immediate Anesthesia Transfer of Care Note  Patient: [name] [name]  Procedure(s) Performed: Procedure(s) (LRB): IRRIGATION AND DEBRIDEMENT EXTREMITY (Left)  Patient Location: PACU  Anesthesia Type: General  Level of Consciousness: awake, alert , oriented and patient cooperative  Airway & Oxygen Therapy: Patient Spontanous Breathing and Patient connected to face mask oxygen  Post-op Assessment: Report given to PACU RN, Post -op Vital signs reviewed and stable and Patient moving all extremities  Post vital signs: Reviewed and stable  Complications: No apparent anesthesia complications

## 2011-03-20 NOTE — Interval H&P Note (Signed)
History and Physical Interval Note:  03/20/2011 12:27 PM  [name] [name]  has presented today for surgery, with the diagnosis of INFECTED LEFT ACHILLIES TENDON  The various methods of treatment have been discussed with the patient and family. After consideration of risks, benefits and other options for treatment, the patient has consented to  Procedure(s) (LRB): IRRIGATION AND DEBRIDEMENT EXTREMITY (Left) as a surgical intervention .  The patients' history has been reviewed, patient examined, no change in status, stable for surgery.  I have reviewed the patients' chart and labs.  Questions were answered to the patient's satisfaction.     Kerin Salen

## 2011-03-20 NOTE — Anesthesia Procedure Notes (Signed)
Procedure Name: LMA Insertion Date/Time: 03/20/2011 12:43 PM Performed by: Lara Mulch Pre-anesthesia Checklist: Patient identified, Emergency Drugs available, Suction available and Patient being monitored Patient Re-evaluated:Patient Re-evaluated prior to inductionOxygen Delivery Method: Circle system utilized Preoxygenation: Pre-oxygenation with 100% oxygen Intubation Type: IV induction LMA: LMA inserted LMA Size: 4.0 Number of attempts: 1 Placement Confirmation: positive ETCO2 and breath sounds checked- equal and bilateral Tube secured with: Tape Dental Injury: Teeth and Oropharynx as per pre-operative assessment

## 2011-03-20 NOTE — Op Note (Signed)
Preoperative diagnosis: Acute infection of 56 year-old Achilles tendon repair wound  Postoperative diagnosis: Same  Procedure: Irrigation debridement of left Achilles tendon wound abscess  Surgeon: Kathalene Frames. Mayer Camel M.D.  First assistant: Leafy Kindle PA-C  Indications for procedure: 56 year old man underwent left Achilles tendon repair by me 7 years ago and did very well until last week when he had swelling and pain over the wound which remain well healed. He presented to our office and aspirated 3 cc of green pus. Gram stain came back with occasional monos and polys no organisms. Culture 2 days as no growth. 2 weeks ago he was in the hospital for pancreatitis secondary to alcohol abuse and was discharged after a four-day stay. Despite the negative cultures he did have an abscess of green pus in his taken for irrigation and debridement of the wound and removal of the FiberWire sutures were placed 7 years ago. He remains afebrile.  Description of procedure: Patient was identified by arm band and continued on his preoperative antibiotics given 1 g of Ancef in the holding area. During the 2 days prior to this surgery the abscess cavity actually shrunk down a great deal. He was taken to operating room 5 where the appropriate anesthetic monitors were attached and general LMA anesthesia was induced with the patient in the supine position. A tourniquet was applied to the left thigh but never used. The left lower extremity was prepped and draped in the usual sterile fashion from the toes to the midthigh. Time out procedure was performed. We began the operation by recreating the Achilles tendon longitudinal incision cutting through the skin the tendon sheath and down to the tendon itself were encountered the #2 fiber wire sutures appear little if any purulent material was actually encountered. Using a #10 blade and Rodgers we then removed the granulation tissue from around the tendon as well as a large portion  of the #2 fiber wire sutures. The tendon was noted to be structurally intact. At this point the wound was thoroughly irrigated out normal saline solution alternating with hydrogen peroxide. We placed a small Hemovac drain in the wound an closed with 2-0 nylon suture. A dressing of 4 x 4's web roll 3 inch Ace wrap was then applied the patient was awakened extubated and taken to the recovery without difficulty.

## 2011-03-20 NOTE — Anesthesia Postprocedure Evaluation (Signed)
Anesthesia Post Note  Patient: [name] [name]  Procedure(s) Performed: Procedure(s) (LRB): IRRIGATION AND DEBRIDEMENT EXTREMITY (Left)  Anesthesia type: general  Patient location: PACU  Post pain: Pain level controlled  Post assessment: Patient's Cardiovascular Status Stable  Last Vitals:  Filed Vitals:   03/20/11 1325  BP:   Temp: 36.2 C  Resp:     Post vital signs: Reviewed and stable  Level of consciousness: sedated  Complications: No apparent anesthesia complications

## 2011-03-20 NOTE — Preoperative (Signed)
Beta Blockers   Reason not to administer Beta Blockers:Not Applicable 

## 2011-03-21 NOTE — Progress Notes (Signed)
Subjective: 1 Day Post-Op Procedure(s) (LRB): IRRIGATION AND DEBRIDEMENT EXTREMITY (Left)  Activity level:  oob nwb left Diet tolerance:  eating Voiding  ok Patient reports pain as 2 on 0-10 scale.    Objective: Vital signs in last 24 hours: Temp:  [97.1 F (36.2 C)-99.5 F (37.5 C)] 98.3 F (36.8 C) (02/23 0609) Pulse Rate:  [55-74] 57  (02/23 0609) Resp:  [13-20] 18  (02/23 0609) BP: (116-183)/(66-108) 144/74 mmHg (02/23 0609) SpO2:  [95 %-100 %] 100 % (02/23 0609) Weight:  [114.624 kg (252 lb 11.2 oz)] 114.624 kg (252 lb 11.2 oz) (02/22 1520)  Intake/Output from previous day: 02/22 0701 - 02/23 0700 In: 1540 [P.O.:600; I.V.:940] Out: 1560 [Urine:1550; Blood:10] Intake/Output this shift:     Basename 03/19/11 1039  HGB 12.0*    Basename 03/19/11 1039  WBC 6.2  RBC 3.98*  HCT 36.9*  PLT 383    Basename 03/19/11 1039  NA 141  K 4.1  CL 108  CO2 25  BUN 11  CREATININE 0.89  GLUCOSE 89  CALCIUM 9.7    Basename 03/19/11 1039  LABPT --  INR 1.12    PHYSICAL EXAM:  ABD soft Neurovascular intact Sensation intact distally Intact pulses distally Dorsiflexion/Plantar flexion intact No cellulitis present Compartment soft Drain pulled  Assessment/Plan: 1 Day Post-Op Procedure(s) (LRB): IRRIGATION AND DEBRIDEMENT EXTREMITY (Left) Advance diet Up with therapy D/C IV fluids Discharge home  Chanta Bauers R 03/21/2011, 8:12 AM

## 2011-03-21 NOTE — Progress Notes (Signed)
D/C instructions reviewed with patient and sister. RX x 1 given. Has camboot. No other hh services or equipment needed. All questions answered. Pt d/c'ed via wheelchair in stable condition

## 2011-03-23 ENCOUNTER — Encounter (HOSPITAL_COMMUNITY): Payer: Self-pay | Admitting: Orthopedic Surgery

## 2011-03-23 LAB — TISSUE CULTURE: Gram Stain: NONE SEEN

## 2011-03-24 NOTE — Discharge Summary (Signed)
Patient ID: [name] [name] MRN: [id number] DOB/AGE: [birth year] 56 y.o.  Admit date: 03/20/2011 Discharge date: 03/24/2011  Admission Diagnoses:  Active Problems:  Wound infection L Achilles 7 years post-op, acute   Discharge Diagnoses:  Same  Past Medical History  Diagnosis Date  . Hypertension   . H/O ETOH abuse   . Colitis   . GERD (gastroesophageal reflux disease)   . Hepatitis     HEP  C  . Cocaine abuse     HISTORY OF  . 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 Diverticulitis     H/O  . Schizophrenia, schizo-affective   . Schizo affective schizophrenia     TAKES ABILIFY  IF  NEEDED  . Headache     Surgeries: Procedure(s): IRRIGATION AND DEBRIDEMENT EXTREMITY on 03/20/2011   Consultants:    Discharged Condition: Improved  Hospital Course: [name] [name] is an 56 y.o. male who was admitted 03/20/2011 for operative treatment of<principal problem not specified>. Patient has severe unremitting pain that affects sleep, daily activities, and work/hobbies. After pre-op clearance the patient was taken to the operating room on 03/20/2011 and underwent  Procedure(s): IRRIGATION AND DEBRIDEMENT EXTREMITY.    Patient was given perioperative antibiotics:  Anti-infectives     Start     Dose/Rate Route Frequency Ordered Stop   03/20/11 1800   cephALEXin (KEFLEX) capsule 500 mg  Status:  Discontinued        500 mg Oral 4 times daily 03/20/11 1525 03/21/11 1411   03/20/11 1140   ceFAZolin (ANCEF) 2-3 GM-% IVPB SOLR     Comments: Durbin, Janie: cabinet override         03/20/11 1140 03/20/11 2344   03/20/11 1138   ceFAZolin (ANCEF) IVPB 2 g/50 mL premix  Status:  Discontinued        2 g 100 mL/hr over 30 Minutes Intravenous 60 min pre-op 03/20/11 1138 03/20/11 1520   03/19/11 1515   ceFAZolin (ANCEF) IVPB 1 g/50 mL premix  Status:  Discontinued        1 g 100 mL/hr over 30 Minutes Intravenous 60 min pre-op 03/19/11 1505 03/20/11 1138            Patient was given sequential compression devices, early ambulation, and chemoprophylaxis to prevent DVT.  Patient benefited maximally from hospital stay and there were no complications.    Recent vital signs: No data found.    Recent laboratory studies: No results found for this basename: WBC:2,HGB:2,HCT:2,PLT:2,NA:2,K:2,CL:2,CO2:2,BUN:2,CREATININE:2,GLUCOSE:2,PT:2,INR:2,CALCIUM,2: in the last 72 hours   Discharge Medications:   Medication List  As of 03/24/2011  1:29 PM   TAKE these medications         cephALEXin 500 MG capsule   Commonly known as: KEFLEX   Take 500 mg by mouth 4 (four) times daily. For 10 days      cloNIDine 0.3 MG tablet   Commonly known as: CATAPRES   Take 0.3 mg by mouth daily.      fenofibrate 48 MG tablet   Commonly known as: TRICOR   Take 48 mg by mouth daily.      HYDROcodone-acetaminophen 5-325 MG per tablet   Commonly known as: NORCO   Take 1-2 tablets by mouth every 6 (six) hours as needed. For pain      HYDROcodone-acetaminophen 5-325 MG per tablet   Commonly known as: NORCO   Take 1-2 tablets by mouth every 4 (four) hours as needed for pain.  labetalol 100 MG tablet   Commonly known as: NORMODYNE   Take 100 mg by mouth 2 (two) times daily.      lisinopril 20 MG tablet   Commonly known as: PRINIVIL,ZESTRIL   Take 20 mg by mouth daily.      omeprazole 20 MG capsule   Commonly known as: PRILOSEC   Take 20 mg by mouth 2 (two) times daily.      traZODone 100 MG tablet   Commonly known as: DESYREL   Take 200 mg by mouth at bedtime.            Diagnostic Studies: Dg Chest 2 View  03/03/2011  *RADIOLOGY REPORT*  Clinical Data: Preop  CHEST - 2 VIEW  Comparison: 02/21/2010  Findings: Cardiomediastinal silhouette is stable.  No acute infiltrate or pleural effusion.  No pulmonary edema.  Stable mild degenerative changes thoracic spine.  IMPRESSION: No active disease.  No significant change.  Original Report Authenticated By: Lahoma Crocker,  M.D.   Ct Abdomen Pelvis W Contrast  03/04/2011  *RADIOLOGY REPORT*  Clinical Data: Mid to lower abdominal pain  CT ABDOMEN AND PELVIS WITH CONTRAST  Technique:  Multidetector CT imaging of the abdomen and pelvis was performed following the standard protocol during bolus administration of intravenous contrast.  Contrast: 137mL OMNIPAQUE IOHEXOL 300 MG/ML IV SOLN  Comparison: 04/01/2010  Findings: Limited images through the lung bases demonstrate no significant appreciable abnormality. The heart size is within normal limits. No pleural or pericardial effusion.  Fatty liver.  Distended gallbladder.  No biliary ductal dilatation. Multiloculated cystic lesions within the spleen, similar to prior. The larger cyst demonstrates a septation with calcification.  Stranding about the tail of the pancreas, extends into the left anterior pararenal space.  Homogeneous pancreatic enhancement.  No loculated fluid collection.  Normal adrenal glands.  Symmetric renal enhancement.  No hydronephrosis or hydroureter.  No bowel obstruction.  No CT evidence for colitis. Normal appendix. No free intraperitoneal air.  There is scattered atherosclerotic calcification of the aorta and its branches. No aneurysmal dilatation.  Thin-walled bladder.  No lymphadenopathy.  Multilevel degenerative changes of the imaged spine. No acute or aggressive appearing osseous lesion.  Surgical changes post laminectomy of the lower lumbar spine. Multiple healing facet fractures.  IMPRESSION: Findings in keeping with acute pancreatitis.  Hepatic steatosis.  Distended gallbladder.  No radiodense gallstones.  Original Report Authenticated By: Suanne Marker, M.D.    Disposition: 01-Home or Self Care  Discharge Orders    Future Orders Please Complete By Expires   Diet - low sodium heart healthy      Increase activity slowly      May shower / Bathe      Driving Restrictions      Comments:   No driving for 2 weeks.   Call MD for:  temperature  >100.4      Call MD for:  severe uncontrolled pain      Call MD for:  redness, tenderness, or signs of infection (pain, swelling, redness, odor or green/yellow discharge around incision site)      Discharge instructions      Comments:   F/U with Dr. Mayer Camel in 1 week.  Leave dressing on at least 72 hrs, then change as needed.  Weightbearing as tolerated.   Call MD / Call 911      Comments:   If you experience chest pain or shortness of breath, CALL 911 and be transported to the hospital emergency room.  If  you develope a fever above 101 F, pus (white drainage) or increased drainage or redness at the wound, or calf pain, call your surgeon's office.   Constipation Prevention      Comments:   Drink plenty of fluids.  Prune juice may be helpful.  You may use a stool softener, such as Colace (over the counter) 100 mg twice a day.  Use MiraLax (over the counter) for constipation as needed.   Increase activity slowly as tolerated      Weight Bearing as taught in Physical Therapy      Comments:   Use a walker or crutches as instructed.         SignedVerlon Au. 03/24/2011, 1:29 PM

## 2011-03-25 LAB — ANAEROBIC CULTURE

## 2011-04-27 ENCOUNTER — Other Ambulatory Visit: Payer: Self-pay | Admitting: Orthopedic Surgery

## 2011-04-29 ENCOUNTER — Encounter (HOSPITAL_COMMUNITY): Payer: Self-pay | Admitting: Pharmacy Technician

## 2011-05-07 ENCOUNTER — Other Ambulatory Visit (HOSPITAL_COMMUNITY): Payer: PRIVATE HEALTH INSURANCE

## 2011-05-09 NOTE — Pre-Procedure Instructions (Signed)
20 [name] [name]  05/09/2011   Your procedure is scheduled on:  Wed, April 17 @ 0830  Report to [facility] at [place].  Call this number if you have problems the morning of surgery: [phone]   Remember:   Do not eat food:After Midnight.  May have clear liquids: up to 4 Hours before arrival.(until 2:30 am)  Clear liquids include soda, tea, black coffee, apple or grape juice, broth,water  Take these medicines the morning of surgery with A SIP OF WATER: Clonidine,Labetalol,and Pain Pill(if needed)   Do not wear jewelry  Do not wear lotions, powders, or perfumes.   Do not bring valuables to the hospital.  Contacts, dentures or bridgework may not be worn into surgery.  Leave suitcase in the car. After surgery it may be brought to your room.  For patients admitted to the hospital, checkout time is 11:00 AM the day of discharge.   Special Instructions: Incentive Spirometry - Practice and bring it with you on the day of surgery. and CHG Shower Use Special Wash: 1/2 bottle night before surgery and 1/2 bottle morning of surgery.   Please read over the following fact sheets that you were given: Pain Booklet, Coughing and Deep Breathing, Blood Transfusion Information, MRSA Information and Surgical Site Infection Prevention

## 2011-05-11 ENCOUNTER — Encounter (HOSPITAL_COMMUNITY): Payer: Self-pay

## 2011-05-11 ENCOUNTER — Ambulatory Visit (HOSPITAL_COMMUNITY): Admission: RE | Admit: 2011-05-11 | Payer: PRIVATE HEALTH INSURANCE | Source: Ambulatory Visit

## 2011-05-11 ENCOUNTER — Encounter (HOSPITAL_COMMUNITY)
Admission: RE | Admit: 2011-05-11 | Discharge: 2011-05-11 | Disposition: A | Discharge status: Home / Self Care | Payer: PRIVATE HEALTH INSURANCE | Source: Ambulatory Visit | Attending: Orthopedic Surgery | Admitting: Orthopedic Surgery

## 2011-05-11 LAB — DIFFERENTIAL
Eosinophils Relative: 4 % (ref 0–5)
Lymphocytes Relative: 39 % (ref 12–46)
Lymphs Abs: 1.8 10*3/uL (ref 0.7–4.0)
Monocytes Absolute: 0.5 10*3/uL (ref 0.1–1.0)
Monocytes Relative: 10 % (ref 3–12)

## 2011-05-11 LAB — COMPREHENSIVE METABOLIC PANEL
ALT: 38 U/L (ref 0–53)
AST: 23 U/L (ref 0–37)
Albumin: 3.9 g/dL (ref 3.5–5.2)
CO2: 25 mEq/L (ref 19–32)
Calcium: 9.9 mg/dL (ref 8.4–10.5)
Sodium: 140 mEq/L (ref 135–145)
Total Protein: 7 g/dL (ref 6.0–8.3)

## 2011-05-11 LAB — URINALYSIS, ROUTINE W REFLEX MICROSCOPIC
Bilirubin Urine: NEGATIVE
Nitrite: NEGATIVE
Protein, ur: NEGATIVE mg/dL
Specific Gravity, Urine: 1.013 (ref 1.005–1.030)
Urobilinogen, UA: 0.2 mg/dL (ref 0.0–1.0)

## 2011-05-11 LAB — SURGICAL PCR SCREEN
MRSA, PCR: NEGATIVE
Staphylococcus aureus: NEGATIVE

## 2011-05-11 LAB — TYPE AND SCREEN
ABO/RH(D): A NEG
Antibody Screen: NEGATIVE

## 2011-05-11 LAB — CBC
MCH: 29 pg (ref 26.0–34.0)
MCHC: 32.7 g/dL (ref 30.0–36.0)
Platelets: 230 10*3/uL (ref 150–400)
RBC: 4.87 MIL/uL (ref 4.22–5.81)
RDW: 13.4 % (ref 11.5–15.5)

## 2011-05-11 NOTE — Progress Notes (Addendum)
SPOKE WITH ALLISON ZELENAK PA OF BP 180/102, KIDNEY FUNCTION IN FEB 2013 WAS NORMAL, ALLISON STATED PATIENT COULD ALSO TAKE LISINOPRIL WITH OTHER BP MEDS (NOT DIURETIC).  PATIENT STATES DR Kennon Holter HAS GIVEN HIM RX LAST Thursday FOR INCREASED LABETOLOL AND ADDED FLUID PILL.  PATIENT INSTRUCTED TO BE SURE AND FILL RX AND START TAKING TODAY, IF BP STILL TO ELEVATED DOS  SURGERY MAY BE CANCELLED.

## 2011-05-12 MED ORDER — POVIDONE-IODINE 7.5 % EX SOLN
Freq: Once | CUTANEOUS | Status: DC
  Filled 2011-05-12: qty 118

## 2011-05-12 MED ORDER — CEFAZOLIN SODIUM-DEXTROSE 2-3 GM-% IV SOLR
2.0000 g | INTRAVENOUS | Status: AC
  Administered 2011-05-13: 1 g via INTRAVENOUS
  Administered 2011-05-13: 2 g via INTRAVENOUS
  Filled 2011-05-12: qty 50

## 2011-05-12 NOTE — Consult Note (Signed)
Anesthesia:  Patient is a 56 year old male scheduled for left sided L3-4, L4-5 transforaminal interbody fusion on 05/13/11.  His PAT was yesterday, 05/12/11, and his BP readings were 180/102 and 164/102 by PAT RN report.  He reported that he had seen his PCP (Dr. Lindwood Qua) last week and one of his antihypertensives had been increased, plus a diuretic was added.  He had not filled either of these prescriptions, but was planning to do so after his appointment.    Current medications include lisinopril, labetalol, clonidine, Norco, Tricor, trazodone.  I did speak with Mr. Weaks today and confirmed he did fill his prescriptions for Labetalol 200mg  BID and Lasix 20mg  QD.  He is suppose to take his labetalol and clonidine on the morning of surgery.  Thru his PAT nurse, I also instructed him to take his lisinopril since his Cr was WNL, and his BP wassignificantly elevated at PAT.  History includes former smoker, HTN, colitis, ETOH abuse, GERD, thrombocytopenia (HIT), headaches, schizophrenia, arthritis, history of cocaine use (last used "years" ago).  He remembers being told he had Hepatitis at some point, but is unsure of details.  He said Dr. Kennon Holter actually ordered some lab work last week in hopes to provide more insight.  Labs noted.  Cr 1.00.  His AST/ALT, PLTs, and coags are WNL.  CXR on 03/03/11 showed no active disease.  EKG on 03/04/11 showed SR, sinus arrhythmia, rightward axis, prolonged QT.  He was seen by Cardiologist Dr. Daneen Schick in March 2012 (records requested).  A stress test was done on 04/07/10 and showed: 1. Chest wall attenuation of the anterior wall.  2. No findings for infarction or ischemia.  3. Calculated ejection fraction was 56%.  His last echo was from 2007 and showed LV wall thickness moderately increased, EF 70%.    I updated Carla at Dr. Laurena Bering office and Anesthesiologist Dr.Jackson.  Anticipate he can proceed if his BP is reasonable on the day of surgery.

## 2011-05-13 ENCOUNTER — Inpatient Hospital Stay (HOSPITAL_COMMUNITY): Payer: PRIVATE HEALTH INSURANCE

## 2011-05-13 ENCOUNTER — Encounter (HOSPITAL_COMMUNITY): Payer: Self-pay | Admitting: Vascular Surgery

## 2011-05-13 ENCOUNTER — Inpatient Hospital Stay (HOSPITAL_COMMUNITY)
Admission: RE | Admit: 2011-05-13 | Discharge: 2011-05-19 | DRG: 460 | Disposition: A | Discharge status: Skilled Nursing Facility | Payer: PRIVATE HEALTH INSURANCE | Source: Ambulatory Visit | Attending: Orthopedic Surgery | Admitting: Orthopedic Surgery

## 2011-05-13 ENCOUNTER — Inpatient Hospital Stay (HOSPITAL_COMMUNITY): Payer: PRIVATE HEALTH INSURANCE | Admitting: Vascular Surgery

## 2011-05-13 ENCOUNTER — Encounter (HOSPITAL_COMMUNITY)
Admission: RE | Disposition: A | Discharge status: Skilled Nursing Facility | Payer: Self-pay | Source: Ambulatory Visit | Attending: Orthopedic Surgery

## 2011-05-13 DIAGNOSIS — Z87891 Personal history of nicotine dependence: Secondary | ICD-10-CM

## 2011-05-13 DIAGNOSIS — Q762 Congenital spondylolisthesis: Secondary | ICD-10-CM

## 2011-05-13 DIAGNOSIS — I10 Essential (primary) hypertension: Secondary | ICD-10-CM | POA: Diagnosis present

## 2011-05-13 DIAGNOSIS — Z01812 Encounter for preprocedural laboratory examination: Secondary | ICD-10-CM

## 2011-05-13 DIAGNOSIS — K219 Gastro-esophageal reflux disease without esophagitis: Secondary | ICD-10-CM | POA: Diagnosis present

## 2011-05-13 DIAGNOSIS — B1920 Unspecified viral hepatitis C without hepatic coma: Secondary | ICD-10-CM | POA: Diagnosis present

## 2011-05-13 DIAGNOSIS — M51379 Other intervertebral disc degeneration, lumbosacral region without mention of lumbar back pain or lower extremity pain: Principal | ICD-10-CM | POA: Diagnosis present

## 2011-05-13 DIAGNOSIS — M129 Arthropathy, unspecified: Secondary | ICD-10-CM | POA: Diagnosis present

## 2011-05-13 DIAGNOSIS — M4800 Spinal stenosis, site unspecified: Secondary | ICD-10-CM

## 2011-05-13 DIAGNOSIS — M5137 Other intervertebral disc degeneration, lumbosacral region: Principal | ICD-10-CM | POA: Diagnosis present

## 2011-05-13 DIAGNOSIS — F259 Schizoaffective disorder, unspecified: Secondary | ICD-10-CM | POA: Diagnosis present

## 2011-05-13 DIAGNOSIS — Z79899 Other long term (current) drug therapy: Secondary | ICD-10-CM

## 2011-05-13 DIAGNOSIS — Z01811 Encounter for preprocedural respiratory examination: Secondary | ICD-10-CM

## 2011-05-13 LAB — POCT I-STAT EG7
Acid-Base Excess: 8 mmol/L — ABNORMAL HIGH (ref 0.0–2.0)
Bicarbonate: 35.9 mEq/L — ABNORMAL HIGH (ref 20.0–24.0)
Calcium, Ion: 1.2 mmol/L (ref 1.12–1.32)
HCT: 38 % — ABNORMAL LOW (ref 39.0–52.0)
Hemoglobin: 12.9 g/dL — ABNORMAL LOW (ref 13.0–17.0)
pCO2, Ven: 65.4 mmHg — ABNORMAL HIGH (ref 45.0–50.0)
pH, Ven: 7.348 — ABNORMAL HIGH (ref 7.250–7.300)
pO2, Ven: 111 mmHg — ABNORMAL HIGH (ref 30.0–45.0)

## 2011-05-13 SURGERY — POSTERIOR LUMBAR FUSION 2 LEVEL
Anesthesia: General | Site: Spine Lumbar | Laterality: Left | Wound class: Clean

## 2011-05-13 MED ORDER — HETASTARCH-ELECTROLYTES 6 % IV SOLN
INTRAVENOUS | Status: DC | PRN
  Administered 2011-05-13 (×2): via INTRAVENOUS

## 2011-05-13 MED ORDER — MORPHINE SULFATE 4 MG/ML IJ SOLN: 2.0000 mg | INTRAMUSCULAR | Status: DC | PRN

## 2011-05-13 MED ORDER — FENOFIBRATE 54 MG PO TABS
54.0000 mg | ORAL_TABLET | Freq: Every day | ORAL | Status: DC
  Administered 2011-05-14 – 2011-05-19 (×6): 54 mg via ORAL
  Filled 2011-05-13 (×6): qty 1

## 2011-05-13 MED ORDER — OXYCODONE-ACETAMINOPHEN 5-325 MG PO TABS
1.0000 | ORAL_TABLET | ORAL | Status: DC | PRN
  Administered 2011-05-16 – 2011-05-19 (×14): 2 via ORAL
  Filled 2011-05-13 (×14): qty 2

## 2011-05-13 MED ORDER — SENNA 8.6 MG PO TABS
1.0000 | ORAL_TABLET | Freq: Two times a day (BID) | ORAL | Status: DC
  Administered 2011-05-13 – 2011-05-19 (×12): 8.6 mg via ORAL
  Filled 2011-05-13 (×13): qty 1

## 2011-05-13 MED ORDER — PROPOFOL 10 MG/ML IV EMUL
INTRAVENOUS | Status: DC | PRN
  Administered 2011-05-13: 200 mg via INTRAVENOUS

## 2011-05-13 MED ORDER — FENTANYL CITRATE 0.05 MG/ML IJ SOLN
INTRAMUSCULAR | Status: DC | PRN
  Administered 2011-05-13 (×2): 50 ug via INTRAVENOUS
  Administered 2011-05-13: 100 ug via INTRAVENOUS
  Administered 2011-05-13 (×5): 25 ug via INTRAVENOUS
  Administered 2011-05-13 (×2): 100 ug via INTRAVENOUS
  Administered 2011-05-13: 25 ug via INTRAVENOUS
  Administered 2011-05-13 (×2): 100 ug via INTRAVENOUS

## 2011-05-13 MED ORDER — SODIUM CHLORIDE 0.9 % IV SOLN: 250.0000 mL | INTRAVENOUS | Status: DC

## 2011-05-13 MED ORDER — VECURONIUM BROMIDE 10 MG IV SOLR
INTRAVENOUS | Status: DC | PRN
  Administered 2011-05-13 (×3): 1 mg via INTRAVENOUS
  Administered 2011-05-13 (×2): 2 mg via INTRAVENOUS
  Administered 2011-05-13 (×2): 1 mg via INTRAVENOUS
  Administered 2011-05-13: 2 mg via INTRAVENOUS
  Administered 2011-05-13: 3 mg via INTRAVENOUS
  Administered 2011-05-13: 1 mg via INTRAVENOUS

## 2011-05-13 MED ORDER — SODIUM CHLORIDE 0.9 % IJ SOLN: 3.0000 mL | INTRAMUSCULAR | Status: DC | PRN

## 2011-05-13 MED ORDER — LACTATED RINGERS IV SOLN
INTRAVENOUS | Status: DC | PRN
  Administered 2011-05-13 (×3): via INTRAVENOUS

## 2011-05-13 MED ORDER — LIDOCAINE HCL (CARDIAC) 20 MG/ML IV SOLN
INTRAVENOUS | Status: DC | PRN
  Administered 2011-05-13: 80 mg via INTRAVENOUS

## 2011-05-13 MED ORDER — DIAZEPAM 5 MG PO TABS
5.0000 mg | ORAL_TABLET | Freq: Four times a day (QID) | ORAL | Status: DC | PRN
  Administered 2011-05-13 – 2011-05-19 (×13): 5 mg via ORAL
  Filled 2011-05-13 (×13): qty 1

## 2011-05-13 MED ORDER — ACETAMINOPHEN 325 MG PO TABS: 650.0000 mg | ORAL_TABLET | ORAL | Status: DC | PRN

## 2011-05-13 MED ORDER — BUPIVACAINE-EPINEPHRINE PF 0.25-1:200000 % IJ SOLN
INTRAMUSCULAR | Status: DC | PRN
  Administered 2011-05-13: 9 mL

## 2011-05-13 MED ORDER — ALUM & MAG HYDROXIDE-SIMETH 200-200-20 MG/5ML PO SUSP
30.0000 mL | Freq: Four times a day (QID) | ORAL | Status: DC | PRN
  Administered 2011-05-18 – 2011-05-19 (×2): 30 mL via ORAL
  Filled 2011-05-13 (×2): qty 30

## 2011-05-13 MED ORDER — LABETALOL HCL 100 MG PO TABS
100.0000 mg | ORAL_TABLET | Freq: Two times a day (BID) | ORAL | Status: DC
  Administered 2011-05-14 – 2011-05-19 (×12): 100 mg via ORAL
  Filled 2011-05-13 (×14): qty 1

## 2011-05-13 MED ORDER — THROMBIN 20000 UNITS EX KIT
PACK | CUTANEOUS | Status: DC | PRN
  Administered 2011-05-13: 10:00:00 via TOPICAL

## 2011-05-13 MED ORDER — SODIUM CHLORIDE 0.9 % IV SOLN
INTRAVENOUS | Status: DC | PRN
  Administered 2011-05-13: 12:00:00 via INTRAVENOUS

## 2011-05-13 MED ORDER — CEFAZOLIN SODIUM 1-5 GM-% IV SOLN
INTRAVENOUS | Status: AC
  Filled 2011-05-13: qty 50

## 2011-05-13 MED ORDER — NALOXONE HCL 0.4 MG/ML IJ SOLN: 0.4000 mg | INTRAMUSCULAR | Status: DC | PRN

## 2011-05-13 MED ORDER — SODIUM CHLORIDE 0.9 % IV SOLN: INTRAVENOUS | Status: DC

## 2011-05-13 MED ORDER — MORPHINE SULFATE (PF) 1 MG/ML IV SOLN
INTRAVENOUS | Status: AC
  Filled 2011-05-13: qty 25

## 2011-05-13 MED ORDER — CLONIDINE HCL 0.3 MG PO TABS
0.3000 mg | ORAL_TABLET | Freq: Every day | ORAL | Status: DC
  Administered 2011-05-14 – 2011-05-19 (×6): 0.3 mg via ORAL
  Filled 2011-05-13 (×6): qty 1

## 2011-05-13 MED ORDER — DOCUSATE SODIUM 100 MG PO CAPS
100.0000 mg | ORAL_CAPSULE | Freq: Two times a day (BID) | ORAL | Status: DC
  Administered 2011-05-13 – 2011-05-19 (×13): 100 mg via ORAL
  Filled 2011-05-13 (×14): qty 1

## 2011-05-13 MED ORDER — HYDROMORPHONE HCL PF 1 MG/ML IJ SOLN
0.2500 mg | INTRAMUSCULAR | Status: DC | PRN
  Administered 2011-05-13 (×4): 0.5 mg via INTRAVENOUS

## 2011-05-13 MED ORDER — ZOLPIDEM TARTRATE 5 MG PO TABS: 5.0000 mg | ORAL_TABLET | Freq: Every evening | ORAL | Status: DC | PRN

## 2011-05-13 MED ORDER — POTASSIUM CHLORIDE IN NACL 20-0.9 MEQ/L-% IV SOLN
INTRAVENOUS | Status: DC
  Administered 2011-05-13 – 2011-05-16 (×5): via INTRAVENOUS
  Filled 2011-05-13 (×6): qty 1000

## 2011-05-13 MED ORDER — LACTATED RINGERS IV SOLN: INTRAVENOUS | Status: DC

## 2011-05-13 MED ORDER — ACETAMINOPHEN 650 MG RE SUPP: 650.0000 mg | RECTAL | Status: DC | PRN

## 2011-05-13 MED ORDER — DIPHENHYDRAMINE HCL 12.5 MG/5ML PO ELIX: 12.5000 mg | ORAL_SOLUTION | Freq: Four times a day (QID) | ORAL | Status: DC | PRN

## 2011-05-13 MED ORDER — MORPHINE SULFATE 2 MG/ML IJ SOLN: 0.0500 mg/kg | INTRAMUSCULAR | Status: DC | PRN

## 2011-05-13 MED ORDER — MENTHOL 3 MG MT LOZG: 1.0000 | LOZENGE | OROMUCOSAL | Status: DC | PRN

## 2011-05-13 MED ORDER — HYDROMORPHONE HCL PF 1 MG/ML IJ SOLN
INTRAMUSCULAR | Status: AC
  Administered 2011-05-13: 0.5 mg via INTRAVENOUS
  Filled 2011-05-13: qty 1

## 2011-05-13 MED ORDER — ROCURONIUM BROMIDE 100 MG/10ML IV SOLN
INTRAVENOUS | Status: DC | PRN
  Administered 2011-05-13: 50 mg via INTRAVENOUS

## 2011-05-13 MED ORDER — ONDANSETRON HCL 4 MG/2ML IJ SOLN: 4.0000 mg | INTRAMUSCULAR | Status: DC | PRN

## 2011-05-13 MED ORDER — DIPHENHYDRAMINE HCL 50 MG/ML IJ SOLN: 12.5000 mg | Freq: Four times a day (QID) | INTRAMUSCULAR | Status: DC | PRN

## 2011-05-13 MED ORDER — PHENOL 1.4 % MT LIQD: 1.0000 | OROMUCOSAL | Status: DC | PRN

## 2011-05-13 MED ORDER — ACETAMINOPHEN 10 MG/ML IV SOLN
INTRAVENOUS | Status: AC
  Filled 2011-05-13: qty 100

## 2011-05-13 MED ORDER — PROPOFOL 10 MG/ML IV EMUL
INTRAVENOUS | Status: DC | PRN
  Administered 2011-05-13: 50 ug/kg/min via INTRAVENOUS

## 2011-05-13 MED ORDER — SODIUM CHLORIDE 0.9 % IJ SOLN: 3.0000 mL | Freq: Two times a day (BID) | INTRAMUSCULAR | Status: DC

## 2011-05-13 MED ORDER — ONDANSETRON HCL 4 MG/2ML IJ SOLN
INTRAMUSCULAR | Status: DC | PRN
  Administered 2011-05-13: 4 mg via INTRAVENOUS

## 2011-05-13 MED ORDER — LISINOPRIL 20 MG PO TABS
20.0000 mg | ORAL_TABLET | Freq: Every day | ORAL | Status: DC
  Administered 2011-05-14 – 2011-05-19 (×6): 20 mg via ORAL
  Filled 2011-05-13 (×6): qty 1

## 2011-05-13 MED ORDER — CEFAZOLIN SODIUM 1-5 GM-% IV SOLN
1.0000 g | Freq: Three times a day (TID) | INTRAVENOUS | Status: AC
  Administered 2011-05-13 – 2011-05-14 (×2): 1 g via INTRAVENOUS
  Filled 2011-05-13 (×2): qty 50

## 2011-05-13 MED ORDER — SODIUM CHLORIDE 0.9 % IJ SOLN: 9.0000 mL | INTRAMUSCULAR | Status: DC | PRN

## 2011-05-13 MED ORDER — MORPHINE SULFATE (PF) 1 MG/ML IV SOLN
INTRAVENOUS | Status: DC
  Administered 2011-05-13: 4 mg via INTRAVENOUS
  Administered 2011-05-13: 17:00:00 via INTRAVENOUS
  Administered 2011-05-13: 15 mg via INTRAVENOUS
  Administered 2011-05-14: 10 mg via INTRAVENOUS

## 2011-05-13 MED ORDER — ONDANSETRON HCL 4 MG/2ML IJ SOLN: 4.0000 mg | Freq: Four times a day (QID) | INTRAMUSCULAR | Status: DC | PRN

## 2011-05-13 MED ORDER — ACETAMINOPHEN 10 MG/ML IV SOLN
1000.0000 mg | Freq: Once | INTRAVENOUS | Status: AC
  Administered 2011-05-13: 1000 mg via INTRAVENOUS

## 2011-05-13 MED ORDER — MIDAZOLAM HCL 5 MG/5ML IJ SOLN
INTRAMUSCULAR | Status: DC | PRN
  Administered 2011-05-13: 2 mg via INTRAVENOUS

## 2011-05-13 MED ORDER — 0.9 % SODIUM CHLORIDE (POUR BTL) OPTIME
TOPICAL | Status: DC | PRN
  Administered 2011-05-13 (×2): 1000 mL

## 2011-05-13 MED ORDER — TRAZODONE HCL 100 MG PO TABS
200.0000 mg | ORAL_TABLET | Freq: Every day | ORAL | Status: DC
  Administered 2011-05-13 – 2011-05-18 (×6): 200 mg via ORAL
  Filled 2011-05-13 (×7): qty 2

## 2011-05-13 SURGICAL SUPPLY — 85 items
APL SKNCLS STERI-STRIP NONHPOA (GAUZE/BANDAGES/DRESSINGS)
BENZOIN TINCTURE PRP APPL 2/3 (GAUZE/BANDAGES/DRESSINGS) IMPLANT
BLADE SURG ROTATE 9660 (MISCELLANEOUS) IMPLANT
BONE CHIP PRESERV 20CC (Bone Implant) ×1 IMPLANT
BUR ROUND PRECISION 4.0 (BURR) ×2 IMPLANT
CAGE CONCORDE BULLET 9X10X27 (Cage) ×1 IMPLANT
CAGE CONCORDE BULLET 9X8X27 (Cage) ×2 IMPLANT
CAGE SPNL PRLL BLT NOSE 27X9X8 (Cage) IMPLANT
CARTRIDGE OIL MAESTRO DRILL (MISCELLANEOUS) ×1 IMPLANT
CLOTH BEACON ORANGE TIMEOUT ST (SAFETY) ×2 IMPLANT
CONT SPEC STER OR (MISCELLANEOUS) ×2 IMPLANT
CORDS BIPOLAR (ELECTRODE) ×2 IMPLANT
COVER SURGICAL LIGHT HANDLE (MISCELLANEOUS) ×2 IMPLANT
DIFFUSER DRILL AIR PNEUMATIC (MISCELLANEOUS) ×2 IMPLANT
DRAIN CHANNEL 15F RND FF W/TCR (WOUND CARE) ×2 IMPLANT
DRAPE C-ARM 42X72 X-RAY (DRAPES) ×3 IMPLANT
DRAPE ORTHO SPLIT 77X108 STRL (DRAPES) ×2
DRAPE POUCH INSTRU U-SHP 10X18 (DRAPES) ×2 IMPLANT
DRAPE SURG 17X23 STRL (DRAPES) ×8 IMPLANT
DRAPE SURG ORHT 6 SPLT 77X108 (DRAPES) ×1 IMPLANT
DRSG ADAPTIC 3X8 NADH LF (GAUZE/BANDAGES/DRESSINGS) ×1 IMPLANT
DRSG MEPILEX BORDER 4X12 (GAUZE/BANDAGES/DRESSINGS) IMPLANT
DRSG MEPILEX BORDER 4X8 (GAUZE/BANDAGES/DRESSINGS) IMPLANT
DURAPREP 26ML APPLICATOR (WOUND CARE) ×2 IMPLANT
ELECT BLADE 4.0 EZ CLEAN MEGAD (MISCELLANEOUS) ×2
ELECT CAUTERY BLADE 6.4 (BLADE) ×4 IMPLANT
ELECT REM PT RETURN 9FT ADLT (ELECTROSURGICAL) ×2
ELECTRODE BLDE 4.0 EZ CLN MEGD (MISCELLANEOUS) ×1 IMPLANT
ELECTRODE REM PT RTRN 9FT ADLT (ELECTROSURGICAL) ×1 IMPLANT
EVACUATOR SILICONE 100CC (DRAIN) ×2 IMPLANT
GAUZE SPONGE 4X4 16PLY XRAY LF (GAUZE/BANDAGES/DRESSINGS) ×8 IMPLANT
GLOVE BIO SURGEON STRL SZ8 (GLOVE) ×6 IMPLANT
GLOVE BIOGEL PI IND STRL 8 (GLOVE) ×1 IMPLANT
GLOVE BIOGEL PI INDICATOR 8 (GLOVE) ×2
GOWN PREVENTION PLUS XLARGE (GOWN DISPOSABLE) ×3 IMPLANT
GOWN STRL NON-REIN LRG LVL3 (GOWN DISPOSABLE) ×3 IMPLANT
IV CATH 14GX2 1/4 (CATHETERS) ×2 IMPLANT
KIT BASIN OR (CUSTOM PROCEDURE TRAY) ×2 IMPLANT
KIT POSITION SURG JACKSON T1 (MISCELLANEOUS) ×2 IMPLANT
KIT ROOM TURNOVER OR (KITS) ×2 IMPLANT
MARKER SKIN DUAL TIP RULER LAB (MISCELLANEOUS) ×2 IMPLANT
MIX DBX 20CC MTF (Putty) ×1 IMPLANT
NDL HYPO 25GX1X1/2 BEV (NEEDLE) ×1 IMPLANT
NDL SPNL 18GX3.5 QUINCKE PK (NEEDLE) ×2 IMPLANT
NEEDLE BONE MARROW 8GX6 FENEST (NEEDLE) IMPLANT
NEEDLE HYPO 25GX1X1/2 BEV (NEEDLE) ×2 IMPLANT
NEEDLE SPNL 18GX3.5 QUINCKE PK (NEEDLE) ×4 IMPLANT
NS IRRIG 1000ML POUR BTL (IV SOLUTION) ×3 IMPLANT
OIL CARTRIDGE MAESTRO DRILL (MISCELLANEOUS) ×2
PACK LAMINECTOMY ORTHO (CUSTOM PROCEDURE TRAY) ×2 IMPLANT
PACK UNIVERSAL I (CUSTOM PROCEDURE TRAY) ×2 IMPLANT
PAD ARMBOARD 7.5X6 YLW CONV (MISCELLANEOUS) ×4 IMPLANT
PATTIES SURGICAL .5 X.5 (GAUZE/BANDAGES/DRESSINGS) ×1 IMPLANT
PATTIES SURGICAL .5 X1 (DISPOSABLE) ×2 IMPLANT
PATTIES SURGICAL .5 X3 (DISPOSABLE) IMPLANT
PATTIES SURGICAL .5X1.5 (GAUZE/BANDAGES/DRESSINGS) ×2 IMPLANT
PATTIES SURGICAL .75X.75 (GAUZE/BANDAGES/DRESSINGS) ×1 IMPLANT
ROD EXPEDIUM PER BENT 65MM (Rod) ×2 IMPLANT
SCREW POLYAXIAL 7X45MM (Screw) ×6 IMPLANT
SCREW SET SINGLE INNER (Screw) ×6 IMPLANT
SPONGE GAUZE 4X4 12PLY (GAUZE/BANDAGES/DRESSINGS) ×2 IMPLANT
SPONGE INTESTINAL PEANUT (DISPOSABLE) IMPLANT
SPONGE LAP 4X18 X RAY DECT (DISPOSABLE) ×2 IMPLANT
SPONGE SURGIFOAM ABS GEL 100 (HEMOSTASIS) ×1 IMPLANT
STRIP CLOSURE SKIN 1/2X4 (GAUZE/BANDAGES/DRESSINGS) IMPLANT
SURGIFLO TRUKIT (HEMOSTASIS) IMPLANT
SUT BONE WAX W31G (SUTURE) ×1 IMPLANT
SUT ETHILON 3 0 PS 1 (SUTURE) ×2 IMPLANT
SUT MNCRL AB 4-0 PS2 18 (SUTURE) ×1 IMPLANT
SUT PROLENE 6 0 C 1 24 (SUTURE) IMPLANT
SUT VIC AB 0 CT1 18XCR BRD 8 (SUTURE) ×2 IMPLANT
SUT VIC AB 0 CT1 8-18 (SUTURE) ×4
SUT VIC AB 1 CT1 18XCR BRD 8 (SUTURE) ×2 IMPLANT
SUT VIC AB 1 CT1 8-18 (SUTURE) ×4
SUT VIC AB 2-0 CT2 18 VCP726D (SUTURE) ×4 IMPLANT
SYR 20CC LL (SYRINGE) ×1 IMPLANT
SYR BULB IRRIGATION 50ML (SYRINGE) ×2 IMPLANT
SYR CONTROL 10ML LL (SYRINGE) ×2 IMPLANT
TAPE CLOTH SURG 4X10 WHT LF (GAUZE/BANDAGES/DRESSINGS) ×1 IMPLANT
TAPE STRIPS DRAPE STRL (GAUZE/BANDAGES/DRESSINGS) ×1 IMPLANT
TOWEL OR 17X24 6PK STRL BLUE (TOWEL DISPOSABLE) ×2 IMPLANT
TOWEL OR 17X26 10 PK STRL BLUE (TOWEL DISPOSABLE) ×2 IMPLANT
TRAY FOLEY CATH 14FR (SET/KITS/TRAYS/PACK) ×2 IMPLANT
WATER STERILE IRR 1000ML POUR (IV SOLUTION) ×2 IMPLANT
YANKAUER SUCT BULB TIP NO VENT (SUCTIONS) ×3 IMPLANT

## 2011-05-13 NOTE — Anesthesia Preprocedure Evaluation (Addendum)
Anesthesia Evaluation  Patient identified by MRN, date of birth, ID band Patient awake    Reviewed: Allergy & Precautions, H&P , NPO status , Patient's Chart, lab work & pertinent test results, reviewed documented beta blocker date and time   Airway Mallampati: II TM Distance: >3 FB Neck ROM: Full    Dental  (+) Edentulous Upper and Dental Advisory Given   Pulmonary neg pulmonary ROS,  breath sounds clear to auscultation        Cardiovascular hypertension, Pt. on medications Rhythm:Regular Rate:Normal     Neuro/Psych  Headaches, PSYCHIATRIC DISORDERS Schizophrenia    GI/Hepatic GERD-  ,(+) Hepatitis -, C  Endo/Other  negative endocrine ROS  Renal/GU negative Renal ROS     Musculoskeletal   Abdominal   Peds  Hematology negative hematology ROS (+)   Anesthesia Other Findings   Reproductive/Obstetrics                          Anesthesia Physical Anesthesia Plan  ASA: III  Anesthesia Plan: General   Post-op Pain Management:    Induction: Intravenous  Airway Management Planned: Oral ETT  Additional Equipment:   Intra-op Plan:   Post-operative Plan: Extubation in OR  Informed Consent:   Plan Discussed with: CRNA  Anesthesia Plan Comments:        Anesthesia Quick Evaluation

## 2011-05-13 NOTE — Transfer of Care (Signed)
Immediate Anesthesia Transfer of Care Note  Patient: [name] [name]  Procedure(s) Performed: Procedure(s) (LRB): POSTERIOR LUMBAR FUSION 2 LEVEL (Left)  Patient Location: PACU  Anesthesia Type: General  Level of Consciousness: awake, oriented and patient cooperative  Airway & Oxygen Therapy: Patient Spontanous Breathing and Patient connected to nasal cannula oxygen  Post-op Assessment: Report given to PACU RN, Post -op Vital signs reviewed and stable and Patient moving all extremities X 4  Post vital signs: Reviewed  Complications: No apparent anesthesia complications

## 2011-05-13 NOTE — Anesthesia Postprocedure Evaluation (Signed)
  Anesthesia Post-op Note  Patient: [name] [name]  Procedure(s) Performed: Procedure(s) (LRB): POSTERIOR LUMBAR FUSION 2 LEVEL (Left)  Patient Location: PACU  Anesthesia Type: General  Level of Consciousness: awake  Airway and Oxygen Therapy: Patient Spontanous Breathing  Post-op Pain: mild  Post-op Assessment: Post-op Vital signs reviewed, Patient's Cardiovascular Status Stable, Respiratory Function Stable, Patent Airway, No signs of Nausea or vomiting and Pain level controlled  Post-op Vital Signs: stable  Complications: No apparent anesthesia complications

## 2011-05-13 NOTE — H&P (Signed)
PREOPERATIVE H&P  Chief Complaint: left thigh pain  HPI: [name] [name] is a 56 y.o. male who presents with left thigh pain  Past Medical History  Diagnosis Date  . Hypertension   . H/O ETOH abuse   . Colitis   . GERD (gastroesophageal reflux disease)   . Hepatitis     HEP  C  . Cocaine abuse     HISTORY OF  . 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 Diverticulitis     H/O  . Headache   . Arthritis   . Schizophrenia, schizo-affective   . Schizo affective schizophrenia     TAKES ABILIFY  IF  NEEDED   Past Surgical History  Procedure Date  . Back surgery     8 MTHS AGO  . Left achilles   . Tonsillectomy   . Knee arthroscopy   . I&d extremity 03/20/2011    Procedure: IRRIGATION AND DEBRIDEMENT EXTREMITY;  Surgeon: Kerin Salen, MD;  Location: [facility];  Service: Orthopedics;  Laterality: Left;  I&D LEFT ACHILLIES TENDON  . Eye surgery     right eye   History   Social History  . Marital Status: Single    Spouse Name: N/A    Number of Children: N/A  . Years of Education: N/A   Social History Main Topics  . Smoking status: Former Smoker -- 1.0 packs/day for 30 years    Types: Cigarettes    Quit date: 03/03/2007  . Smokeless tobacco: Not on file  . Alcohol Use: No  . Drug Use: No     patient denies  . Sexually Active: Not Currently   Other Topics Concern  . Not on file   Social History Narrative  . No narrative on file   No family history on file. Allergies  Allergen Reactions  . Thorazine (Chlorpromazine Hcl) Other (See Comments)    Body freezes up   Prior to Admission medications   Medication Sig Start Date End Date Taking? Authorizing Provider  cloNIDine (CATAPRES) 0.3 MG tablet Take 0.3 mg by mouth daily.    Yes Historical Provider, MD  fenofibrate (TRICOR) 48 MG tablet Take 48 mg by mouth daily.   Yes Historical Provider, MD  HYDROcodone-acetaminophen (NORCO) 5-325 MG per tablet Take 1-2 tablets by mouth every 6 (six) hours as  needed. For pain   Yes Historical Provider, MD  labetalol (NORMODYNE) 100 MG tablet Take 100 mg by mouth 2 (two) times daily.   Yes Historical Provider, MD  lisinopril (PRINIVIL,ZESTRIL) 20 MG tablet Take 20 mg by mouth daily.   Yes Historical Provider, MD  traZODone (DESYREL) 100 MG tablet Take 200 mg by mouth at bedtime.     Yes Historical Provider, MD     All other systems have been reviewed and were otherwise negative with the exception of those mentioned in the HPI and as above.  Physical Exam: Filed Vitals:   05/13/11 0755  BP: 164/100  Pulse: 54  Temp: 98.2 F (36.8 C)  Resp: 18    General: Alert, no acute distress Cardiovascular: No pedal edema Respiratory: No cyanosis, no use of accessory musculature GI: No organomegaly, abdomen is soft and non-tender Skin: No lesions in the area of chief complaint Neurologic: Sensation intact distally Psychiatric: Patient is competent for consent with normal mood and affect Lymphatic: No axillary or cervical lymphadenopathy  MUSCULOSKELETAL: + TTP low back, well healed incision  Assessment/Plan: Left leg pain Plan for Procedure(s): POSTERIOR LUMBAR FUSION  [place], MD 05/13/2011 8:33 AM

## 2011-05-13 NOTE — Anesthesia Postprocedure Evaluation (Signed)
  Anesthesia Post-op Note  Patient: [name] [name]  Procedure(s) Performed: Procedure(s) (LRB): POSTERIOR LUMBAR FUSION 2 LEVEL (Left)  Patient Location: PACU  Anesthesia Type: General  Level of Consciousness: awake  Airway and Oxygen Therapy: Patient Spontanous Breathing and Patient connected to nasal cannula oxygen  Post-op Pain: moderate  Post-op Assessment: Post-op Vital signs reviewed, Patient's Cardiovascular Status Stable, Respiratory Function Stable, Patent Airway and No signs of Nausea or vomiting  Post-op Vital Signs: Reviewed and stable  Complications: No apparent anesthesia complications

## 2011-05-13 NOTE — Op Note (Signed)
NAME:  PERL, KLAUSER NO.:  [id number]  MEDICAL RECORD NO.:  [id number]  LOCATION:  [id number]                         FACILITY:  [facility]  PHYSICIAN:  Phylliss Bob, MD      DATE OF BIRTH:  14-Jul-1955  DATE OF PROCEDURE:  05/13/2011 DATE OF DISCHARGE:                              OPERATIVE REPORT   PREOPERATIVE DIAGNOSES: 1. L3-4, L4-5 degenerative disk disease. 2. Left-sided neuroforaminal stenosis at L3-4 and L4-5 resulting left-     sided lumbar radiculopathy. 3. Grade 1 L3-4 spondylolisthesis. 4. Status post lumbar decompression. 5. Left-sided lumbar radiculopathy.  POSTOPERATIVE DIAGNOSES: 1. L3-4, L4-5 degenerative disk disease. 2. Left-sided neuroforaminal stenosis at L3-4 and L4-5 resulting left-     sided lumbar radiculopathy. 3. Grade 1 L3-4 spondylolisthesis. 4. Status post lumbar decompression. 5. Left-sided lumbar radiculopathy.  PROCEDURES: 1. Left-sided transforaminal lumbar interbody fusion at L3-4 and L4-5. 2. Right-sided posterolateral fusion at L3-4, L4-5. 3. Placement of posterior instrumentation at L3, L4, L5. 4. Insertion of interbody device x2 (27-mm Concorde bulleted cage, 8     mm at L3-4 and 10 mm at L4-5). 5. Use of local autograft. 6. Use of morselized allograft. 7. Intraoperative use of fluoroscopy.  SURGEON:  Phylliss Bob, MD  ASSISTANT:  Lowell Guitar. Mancel Bale, [place]  ANESTHESIA:  General endotracheal anesthesia.  COMPLICATIONS:  None.  DISPOSITION:  Stable.  ESTIMATED BLOOD LOSS:  600 mL.  INDICATIONS FOR PROCEDURE:  Briefly, Mr. Mitrovic is a pleasant 56 year old male who is status post a lumbar decompressive procedure by me. Postoperatively, the patient did go on to have a L3-4 spondylolisthesis. An updated MRI was obtained and was notable for interval neuroforaminal stenosis on the left-sided at L3-4 and L4-5, causing left-sided lumbar radiculopathy.  We did go forward with conservative treatment options with the patient  continued to have pain.  The patient did fail multiple forms of conservative care and we therefore did have a discussion regarding going forward with the procedure noted above.  The patient fully understood the risks and limitations of the procedure as outlined in my preoperative note.  OPERATIVE DETAILS:  On May 13, 2011, the patient was brought to Surgery and general endotracheal anesthesia was administered.  The patient was placed prone on a flat Jackson bed with a Wilson frame. Blocks were given and SCDs were placed.  The back was then prepped and draped in usual sterile fashion.  At this point, I did obtain a lateral intraoperative radiograph to confirm the appropriate trajectory of the L3, the L4 and the L5 pedicles.  An incision was made in line with the patient's previous incision.  Abundant scar was noted over the epidural space.  This was dealt with carefully in order to ensure that there was not a durotomy encountered.  I then bluntly retracted the musculature towards both the right and the left sides.  I then subperiosteally exposed the transverse processes of L3, L4, L5, and both the right and the left sides.  I also suppressed and exposed the pars interarticularis of L3, L4, and L5.  Again, the patient was status post a previous decompression and I did use meticulous care during this aspect of the procedure  to ensure that a cerebrospinal fluid leak was not encountered. I then started on the right side and cannulated the L3, L4 and the L5 pedicles in the usual fashion using a 4-mm high-speed bur followed by a gearshift probe followed by a ball-tipped probe followed by a 6-mm tap. A ball-tip probe did confirm that there was no cortical violation.  I did use fluoroscopy while placing the screws in order to help optimize the location of the screws.  I then used a 4-mm high-speed bur to decorticate the transverse processes of L3, L4, and L5 on the right side in addition to the  pars interarticularis.  I then packed the autograft into the posterolateral gutter in addition to DBX mix in addition to cancellous allograft.  Again, the combination was packed into the posterolateral gutter and along the pars interarticularis after decorticating.  The 7 x 45-mm screws were then placed.  I then tested each of the screws and there was no triggered EMG response at anything less than 25 milliamps.  I then placed distraction across the screws and a final locking was performed.  I then turned my attention towards the patient's left side.  I cannulated the L3, L4, and L5 pedicles in the manner described previously.  Bone wax was placed in the cannulated holes.  I then turned my attention towards the L3-4 interspace and removed the facet joint.  Again, I did meticulously identified the plane between the scar tissue dorsally and the bone anteriorly.  I then went forward with a facetectomy and the L3-4 intervertebral space was readily identified.  The exiting L3 nerve was adequately decompressed.  I then went forward with a diskectomy in the usual manner using a series of paddle scrapers in addition to curettes and pituitaries.  I then placed a series of trials and did feel that an 8-mm trial would be the most appropriate fit.  The interbody space was then packed with allograft and autograft and an 8 x 27-mm Concorde bulleted cage was also packed and tamped into position.  Lateral fluoroscopy was used to help optimize the location of the implant.  I then turned my attention towards the patient's L4-5 interspace.  Again, facetectomy was performed in the manner described previously.  A diskectomy was also performed in the manner described previously.  I did use a series of curettes, paddle scrapers and pituitaries in the manner described previously.  I placed a series of trials and did feel that a trial with a 10-mm height would be the most appropriate fit.  This was tamped into  position in the usual fashion.  I was very happy with the final press-fit of each of the implants.  At this point, I removed the rod on the contralateral side and I placed 7 x 45-mm screws on the left.  I did test each of the screws and there was no triggered EMG response that suggests with any cortical violation.  I then placed a 65-mm rod on the left side and a compression maneuver was performed and caps were placed followed by a final locking procedure.  I then placed a 65-mm rod on the right side and compression was performed on the right side as well, and caps were placed followed by a final locking procedure.  I was very happy with the final appearance on both AP and lateral radiographs.  Of note, the wound was copiously irrigated approximately every 30 minutes throughout the procedure.  The fascia was then closed using #1  Vicryl.  The subcutaneous layer was closed using 2-0 Vicryl and the skin was closed using 3-0 nylon.  All instrument counts were correct at the termination of the procedure.  Of note, Nehemiah Massed was my assistant throughout the procedure and aided in essential retraction and suctioning required throughout the surgery.     Phylliss Bob, MD     MD/MEDQ  D:  05/13/2011  T:  05/13/2011  Job:  [id number]  cc:   Lindwood Qua, MD

## 2011-05-13 NOTE — Preoperative (Signed)
Beta Blockers   Reason not to administer Beta Blockers:Not Applicable, last dose [id number] at 0500

## 2011-05-14 ENCOUNTER — Encounter (HOSPITAL_COMMUNITY): Payer: Self-pay | Admitting: *Deleted

## 2011-05-14 DIAGNOSIS — IMO0002 Reserved for concepts with insufficient information to code with codable children: Secondary | ICD-10-CM

## 2011-05-14 DIAGNOSIS — Q762 Congenital spondylolisthesis: Secondary | ICD-10-CM

## 2011-05-14 MED ORDER — DIPHENHYDRAMINE HCL 50 MG/ML IJ SOLN: 12.5000 mg | Freq: Four times a day (QID) | INTRAMUSCULAR | Status: DC | PRN

## 2011-05-14 MED ORDER — ONDANSETRON HCL 4 MG/2ML IJ SOLN: 4.0000 mg | Freq: Four times a day (QID) | INTRAMUSCULAR | Status: DC | PRN

## 2011-05-14 MED ORDER — MORPHINE SULFATE (PF) 1 MG/ML IV SOLN
INTRAVENOUS | Status: AC
  Filled 2011-05-14: qty 25

## 2011-05-14 MED ORDER — NALOXONE HCL 0.4 MG/ML IJ SOLN: 0.4000 mg | INTRAMUSCULAR | Status: DC | PRN

## 2011-05-14 MED ORDER — SODIUM CHLORIDE 0.9 % IJ SOLN: 9.0000 mL | INTRAMUSCULAR | Status: DC | PRN

## 2011-05-14 MED ORDER — MORPHINE SULFATE (PF) 1 MG/ML IV SOLN
INTRAVENOUS | Status: DC
  Administered 2011-05-14: 07:00:00 via INTRAVENOUS
  Administered 2011-05-14: 14.99 mg via INTRAVENOUS
  Administered 2011-05-15: 1.31 mg via INTRAVENOUS
  Administered 2011-05-15: 10.5 mg via INTRAVENOUS
  Administered 2011-05-15: 09:00:00 via INTRAVENOUS
  Administered 2011-05-15: 4.5 mg via INTRAVENOUS
  Administered 2011-05-16: 10.5 mg via INTRAVENOUS
  Administered 2011-05-16: 7.5 mg via INTRAVENOUS

## 2011-05-14 MED ORDER — MORPHINE SULFATE (PF) 1 MG/ML IV SOLN
INTRAVENOUS | Status: AC
  Administered 2011-05-14: 19.5 mg
  Filled 2011-05-14: qty 25

## 2011-05-14 MED ORDER — DIPHENHYDRAMINE HCL 12.5 MG/5ML PO ELIX: 12.5000 mg | ORAL_SOLUTION | Freq: Four times a day (QID) | ORAL | Status: DC | PRN

## 2011-05-14 NOTE — Progress Notes (Signed)
Patient comfortable.  Using PCA, left leg pain resolved.  BP 112/89  Pulse 66  Temp(Src) 97.8 F (36.6 C) (Oral)  Resp 22  Ht 6' (1.829 m)  Wt 119.9 kg (264 lb 5.3 oz)  BMI 35.85 kg/m2  SpO2 100%  NVI Dressing CDI  POD #1 after L3-5 TLIF on left  - up with PT today with brace - will likely need inpt rehab, hope to transfer Friday if possible - continue pain meds, valium

## 2011-05-14 NOTE — Progress Notes (Signed)
Notified Gaspar Skeeters PA of pt's back pain unrelieved by reduced dose PCA Morphine.  Also notified him that patient's dsg had bled through.  Saturated ABD pads were removed and replaced with clean ones.Order placed in chart.

## 2011-05-14 NOTE — Consult Note (Signed)
Physical Medicine and Rehabilitation Consult Reason for Consult: Lumbar stenosis with radiculopathy Referring Phsyician: Dr. Lind Guest is an 56 y.o. male.   HPI: 56 year old right-handed male with history of lumbar decompression 8 months ago. Admitted April 17 with progressive low back pain radiating to the left thigh. Patient was independent with cane prior to admission and had recently received a home health aide 4 hours a day to assist with ADLs. X-rays and imaging revealed degenerative disc disease with lumbar stenosis L3-4 L4-5 with spondylolisthesis and radiculopathy. Underwent lumbar interbody fusion L3-4 and L4-5 with placement of posterior instrumentation 05/13/2011 per Dr.Dumonski. Postoperative pain management. Fitted with a back brace when out of bed that could be placed in the sitting position. Physical occupational therapy evaluations are pending. M.D. has requested physical medicine rehabilitation consult to consider inpatient rehabilitation services  Review of Systems  Gastrointestinal: Positive for constipation.  Musculoskeletal: Positive for back pain.  Psychiatric/Behavioral: The patient has insomnia.   All other systems reviewed and are negative.   Past Medical History  Diagnosis Date  . Hypertension   . H/O ETOH abuse   . Colitis   . GERD (gastroesophageal reflux disease)   . Hepatitis     HEP  C  . Cocaine abuse     HISTORY OF  . 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 Diverticulitis     H/O  . Headache   . Arthritis   . Schizophrenia, schizo-affective   . Schizo affective schizophrenia     TAKES ABILIFY  IF  NEEDED   Past Surgical History  Procedure Date  . Back surgery     8 MTHS AGO  . Left achilles   . Tonsillectomy   . Knee arthroscopy   . I&d extremity 03/20/2011    Procedure: IRRIGATION AND DEBRIDEMENT EXTREMITY;  Surgeon: Kerin Salen, MD;  Location: [facility];  Service: Orthopedics;  Laterality: Left;  I&D LEFT  ACHILLIES TENDON  . Eye surgery     right eye   No family history on file. Social History:  reports that he quit smoking about 4 years ago. His smoking use included Cigarettes. He has a 30 pack-year smoking history. He does not have any smokeless tobacco history on file. He reports that he does not drink alcohol or use illicit drugs. Allergies:  Allergies  Allergen Reactions  . Thorazine (Chlorpromazine Hcl) Other (See Comments)    Body freezes up   Medications Prior to Admission  Medication Dose Route Frequency Provider Last Rate Last Dose  . 0.9 %  sodium chloride infusion  250 mL Intravenous Continuous Sinclair Ship, MD      . 0.9 % NaCl with KCl 20 mEq/ L  infusion   Intravenous Continuous Sinclair Ship, MD 80 mL/hr at 05/13/11 2050    . acetaminophen (OFIRMEV) 10 MG/ML IV           . acetaminophen (OFIRMEV) IV 1,000 mg  1,000 mg Intravenous Once Roberts Gaudy, MD   1,000 mg at 05/13/11 1836  . acetaminophen (TYLENOL) tablet 650 mg  650 mg Oral Q4H PRN Sinclair Ship, MD       Or  . acetaminophen (TYLENOL) suppository 650 mg  650 mg Rectal Q4H PRN Sinclair Ship, MD      . alum & mag hydroxide-simeth (MAALOX/MYLANTA) 200-200-20 MG/5ML suspension 30 mL  30 mL Oral Q6H PRN Sinclair Ship, MD      . ceFAZolin (ANCEF) IVPB 1  g/50 mL premix  1 g Intravenous Q8H Sinclair Ship, MD   1 g at 05/14/11 0430  . ceFAZolin (ANCEF) IVPB 2 g/50 mL premix  2 g Intravenous 60 min Pre-Op Sinclair Ship, MD   1 g at 05/13/11 1232  . cloNIDine (CATAPRES) tablet 0.3 mg  0.3 mg Oral Daily Sinclair Ship, MD      . diazepam (VALIUM) tablet 5 mg  5 mg Oral Q6H PRN Sinclair Ship, MD   5 mg at 05/14/11 [id number]  . diphenhydrAMINE (BENADRYL) injection 12.5 mg  12.5 mg Intravenous Q6H PRN Erlene Senters, PA       Or  . diphenhydrAMINE (BENADRYL) 12.5 MG/5ML elixir 12.5 mg  12.5 mg Oral Q6H PRN Erlene Senters, PA      . docusate sodium (COLACE)  capsule 100 mg  100 mg Oral BID Sinclair Ship, MD   100 mg at 05/13/11 2355  . fenofibrate tablet 54 mg  54 mg Oral Daily Sinclair Ship, MD      . labetalol (NORMODYNE) tablet 100 mg  100 mg Oral BID Sinclair Ship, MD   100 mg at 05/14/11 0000  . lisinopril (PRINIVIL,ZESTRIL) tablet 20 mg  20 mg Oral Daily Sinclair Ship, MD      . menthol-cetylpyridinium (CEPACOL) lozenge 3 mg  1 lozenge Oral PRN Sinclair Ship, MD       Or  . phenol (CHLORASEPTIC) mouth spray 1 spray  1 spray Mouth/Throat PRN Sinclair Ship, MD      . morphine 1 MG/ML PCA injection           . morphine 1 MG/ML PCA injection   Intravenous Q4H Erlene Senters, PA      . morphine 4 MG/ML injection 2 mg  2 mg Intravenous Q2H PRN Sinclair Ship, MD      . naloxone [facility]) injection 0.4 mg  0.4 mg Intravenous PRN Erlene Senters, PA       And  . sodium chloride 0.9 % injection 9 mL  9 mL Intravenous PRN Erlene Senters, PA      . ondansetron [facility]) injection 4 mg  4 mg Intravenous Q4H PRN Sinclair Ship, MD      . ondansetron [facility]) injection 4 mg  4 mg Intravenous Q6H PRN Erlene Senters, PA      . oxyCODONE-acetaminophen (PERCOCET) 5-325 MG per tablet 1-2 tablet  1-2 tablet Oral Q4H PRN Sinclair Ship, MD      . [facility] Hawks [facility]) tablet 8.6 mg  1 tablet Oral BID Sinclair Ship, MD   8.6 mg at 05/13/11 2355  . sodium chloride 0.9 % injection 3 mL  3 mL Intravenous Q12H Sinclair Ship, MD      . sodium chloride 0.9 % injection 3 mL  3 mL Intravenous PRN Sinclair Ship, MD      . traZODone (DESYREL) tablet 200 mg  200 mg Oral QHS Sinclair Ship, MD   200 mg at 05/13/11 2355  . zolpidem (AMBIEN) tablet 5 mg  5 mg Oral QHS PRN Sinclair Ship, MD      . DISCONTD: 0.9 %  sodium chloride infusion   Intravenous Continuous Finis Bud, MD      . DISCONTD: 0.9 % irrigation (POUR BTL)    PRN Sinclair Ship, MD   1,000 mL at  05/13/11 1219  . DISCONTD: bupivacaine-EPINEPHrine (PF) (MARCAINE) 0.25-1:200000 % injection  PRN Sinclair Ship, MD   9 mL at 05/13/11 1020  . DISCONTD: diphenhydrAMINE (BENADRYL) 12.5 MG/5ML elixir 12.5 mg  12.5 mg Oral Q6H PRN Sinclair Ship, MD      . DISCONTD: diphenhydrAMINE (BENADRYL) injection 12.5 mg  12.5 mg Intravenous Q6H PRN Sinclair Ship, MD      . DISCONTD: Gelfoam 100 with Thrombin 20,000 units (20 mL) topical    PRN Sinclair Ship, MD      . DISCONTD: HYDROmorphone (DILAUDID) injection 0.25-0.5 mg  0.25-0.5 mg Intravenous Q5 min PRN Finis Bud, MD   0.5 mg at 05/13/11 1800  . DISCONTD: lactated ringers infusion   Intravenous Continuous Finis Bud, MD      . DISCONTD: morphine 1 MG/ML PCA injection   Intravenous Q4H Sinclair Ship, MD   10 mg at 05/14/11 0340  . DISCONTD: morphine 2 MG/ML injection 0.05 mg/kg  0.05 mg/kg Intravenous Q10 min PRN Finis Bud, MD      . DISCONTD: naloxone [facility]) injection 0.4 mg  0.4 mg Intravenous PRN Sinclair Ship, MD      . DISCONTD: ondansetron [facility]) injection 4 mg  4 mg Intravenous Q6H PRN Sinclair Ship, MD      . DISCONTD: povidone-iodine (BETADINE) 7.5 % scrub   Topical Once Sinclair Ship, MD      . DISCONTD: sodium chloride 0.9 % injection 9 mL  9 mL Intravenous PRN Sinclair Ship, MD       Medications Prior to Admission  Medication Sig Dispense Refill  . cloNIDine (CATAPRES) 0.3 MG tablet Take 0.3 mg by mouth daily.       . fenofibrate (TRICOR) 48 MG tablet Take 48 mg by mouth daily.      Marland Kitchen labetalol (NORMODYNE) 100 MG tablet Take 100 mg by mouth 2 (two) times daily.      Marland Kitchen lisinopril (PRINIVIL,ZESTRIL) 20 MG tablet Take 20 mg by mouth daily.      . traZODone (DESYREL) 100 MG tablet Take 200 mg by mouth at bedtime.          Home:    Functional History:   Functional Status:  Mobility:          ADL:    Cognition: Cognition Orientation  Level: Oriented to person;Oriented to place;Oriented to situation Cognition Orientation Level: Oriented to person;Oriented to place;Oriented to situation  Blood pressure 104/66, pulse 66, temperature 98.2 F (36.8 C), temperature source Oral, resp. rate 22, SpO2 99.00%. Physical Exam  Vitals reviewed. Constitutional: He is oriented to person, place, and time. He appears well-developed.  HENT:  Head: Normocephalic.  Neck: No thyromegaly present.  Cardiovascular: Regular rhythm.   Pulmonary/Chest: Breath sounds normal. He has no wheezes.  Abdominal: He exhibits no distension. There is no tenderness.  Neurological: He is alert and oriented to person, place, and time.  Skin:       Back incision dressed  Psychiatric: He has a normal mood and affect.    Results for orders placed during the hospital encounter of 05/13/11 (from the past 24 hour(s))  POCT I-STAT 7, (EG7 V)     Status: Abnormal   Collection Time   05/13/11  3:44 PM      Component Value Range   pH, Ven 7.348 (*) 7.250 - 7.300    pCO2, Ven 65.4 (*) 45.0 - 50.0 (mmHg)   pO2, Ven 111.0 (*) 30.0 - 45.0 (mmHg)   Bicarbonate 35.9 (*) 20.0 - 24.0 (mEq/L)  TCO2 38  0 - 100 (mmol/L)   O2 Saturation 98.0     Acid-Base Excess 8.0 (*) 0.0 - 2.0 (mmol/L)   Sodium 140  135 - 145 (mEq/L)   Potassium 4.5  3.5 - 5.1 (mEq/L)   Calcium, Ion 1.20  1.12 - 1.32 (mmol/L)   HCT 38.0 (*) 39.0 - 52.0 (%)   Hemoglobin 12.9 (*) 13.0 - 17.0 (g/dL)   Patient temperature 37.2 C     Sample type VENOUS     Dg Lumbar Spine 2-3 Views  05/13/2011  *RADIOLOGY REPORT*  Clinical Data: Lumbar fusion  LUMBAR SPINE - 2-3 VIEW  Comparison: MRI lumbar spine 01/06/2011  Findings: Two spot fluoroscopic views show posterior pedicle rod and screws spanning L3, L4, and L5.  Interbody spacers are present at L3-4 and L4-5.  No complicating features are identified on these fluoroscopic views.  IMPRESSION: PLIF spanning L3-L5.  Original Report Authenticated By: Curlene Dolphin, M.D.   Dg Lumbar Spine 1 View  05/13/2011  *RADIOLOGY REPORT*  Clinical Data: L2-L5 TLIF.  LUMBAR SPINE - 1 VIEW  Comparison: Abdominal CT 03/04/2011.  Lumbar MRI 01/06/2011.  Findings: Cross-table lateral view labeled #1 at 0915 hours. Patient is status post laminectomy from L2-L4.  There appears to be a small localizing needle posterior to the lower sacrum.  No other localizing instruments are identified.  The lumbar spondylosis appears stable.  IMPRESSION: Apparent localizing needle over the lower sacrum.  No lumbar localizing instruments identified.  Original Report Authenticated By: Vivia Ewing, M.D.    Assessment/Plan: Diagnosis: Lumbar spondylolisthesis, lumbar stenosis and lumbar radiculopathy status post decompression and fusion postoperative day #1 1. Does the need for close, 24 hr/day medical supervision in concert with the patient's rehab needs make it unreasonable for this patient to be served in a less intensive setting? Potentially 2. Co-Morbidities requiring supervision/potential complications: Hepatitis C, schizophrenia, gouty arthropathy, hypertension, history of pancreatitis 3. Due to bladder management, bowel management, safety, skin/wound care, disease management, medication administration, pain management and patient education, does the patient require 24 hr/day rehab nursing? Potentially 4. Does the patient require coordinated care of a physician, rehab nurse, PT (1-2 hrs/day, 5 days/week) and OT (1-2 hrs/day, 5 days/week) to address physical and functional deficits in the context of the above medical diagnosis(es)? Potentially Addressing deficits in the following areas: balance, endurance, locomotion, strength, transferring, bathing, dressing and toileting 5. Can the patient actively participate in an intensive therapy program of at least 3 hrs of therapy per day at least 5 days per week? Potentially 6. The potential for patient to make measurable gains while on  inpatient rehab is good 7. Anticipated functional outcomes upon discharge from inpatient rehab are supervision to min assist with PT, supervision to min assist ADLs with OT, not applicable with SLP. 8. Estimated rehab length of stay to reach the above functional goals is: 7-10 days 9. Does the patient have adequate social supports to accommodate these discharge functional goals? Potentially 10. Anticipated D/C setting: Home 11. Anticipated post D/C treatments: Agency therapy 12. Overall Rehab/Functional Prognosis: good  RECOMMENDATIONS: This patient's condition is appropriate for continued rehabilitative care in the following setting: Currently not a CIR candidate however he is only postop day #1. The patient will need further PT and OT. If he is slow to progress would require CIR. If he progresses normally, home health may be adequate Patient has agreed to participate in recommended program. Potentially Note that insurance prior authorization may be required for reimbursement  for recommended care.  Comment:   ANGIULLI,DANIEL J. 05/14/2011

## 2011-05-14 NOTE — Progress Notes (Addendum)
Occupational Therapy Evaluation  Past Medical History  Diagnosis Date  . Hypertension   . H/O ETOH abuse   . Colitis   . GERD (gastroesophageal reflux disease)   . Hepatitis     HEP  C  . Cocaine abuse     HISTORY OF  . 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 Diverticulitis     H/O  . Headache   . Arthritis   . Schizophrenia, schizo-affective   . Schizo affective schizophrenia     TAKES ABILIFY  IF  NEEDED     Past Surgical History  Procedure Date  . Back surgery     8 MTHS AGO  . Left achilles   . Tonsillectomy   . Knee arthroscopy   . I&d extremity 03/20/2011    Procedure: IRRIGATION AND DEBRIDEMENT EXTREMITY;  Surgeon: Kerin Salen, MD;  Location: [facility];  Service: Orthopedics;  Laterality: Left;  I&D LEFT ACHILLIES TENDON  . Eye surgery     right eye    05/14/11 1100  OT Visit Information  Last OT Received On 05/14/11  PT/OT Co-Evaluation/Treatment Yes  Subjective Data  Subjective "I need to move"  Precautions  Precautions Back  Required Braces or Orthoses Spinal Brace  Spinal Brace TLSO  Restrictions  Weight Bearing Restrictions No  Home Living  Lives With Alone  Available Help at Discharge Friend(s);Available PRN/intermittently  Type of Home House  Home Access Stairs to enter  Entrance Stairs-Number of Steps 1  Entrance Stairs-Rails None  Home Layout One level  Bathroom Shower/Tub Tub/shower unit;Curtain  Corporate treasurer Yes  How Accessible Accessible via walker  Home Adaptive Equipment Straight cane  Prior Function  Level of Independence Independent  Able to Take Stairs? Yes  Driving No  Vocation On disability  Comments Pt. would take the bus when needing to get groceries.   ADL  Eating/Feeding Simulated;Independent  Where Assessed - Eating/Feeding Chair  Grooming Wash/dry hands;Wash/dry face;Performed;Set up;Minimal assistance  Grooming Details (indicate cue type and reason) Min  verbal cues for safety with use of RW  Where Assessed - Grooming Standing at sink  Upper Body Bathing Simulated;Chest;Right arm;Left arm;Abdomen;Moderate assistance  Where Assessed - Upper Body Bathing Sitting, bed  Lower Body Bathing Simulated;+1 Total assistance  Where Assessed - Lower Body Bathing Sit to stand from bed  Upper Body Dressing Performed;Minimal assistance  Upper Body Dressing Details (indicate cue type and reason) with donning gown  Where Assessed - Upper Body Dressing Sitting, bed  Lower Body Dressing Simulated;+1 Total assistance  Where Assessed - Lower Body Dressing Sit to stand from bed  Toilet Transfer Simulated;+2 Total assistance;Comment for patient % (pt=60%)  Toilet Transfer Details (indicate cue type and reason) Mod verbal cues for hand placement on arm rests and RW  Toilet Transfer Method Holiday representative Other (comment) (straight back chair)  Toileting - Clothing Manipulation Simulated;Minimal assistance  Where Assessed - Toileting Clothing Manipulation Sit on 3-in-1 or toilet  Toileting - Hygiene Simulated;Minimal assistance  Where Assessed - Toileting Hygiene Sit on 3-in-1 or toilet  Tub/Shower Transfer Not assessed  Equipment Used Rolling walker;Back brace  Ambulation Related to ADLs Pt. min assist ~15' with RW and mod verbal cues for sequencing with RW  ADL Comments Pt. educated on 3/3 back precautions and techniques for completing ADLs with following precautions. Pt. total assist with donning brace EOB and requiring frequent cues throughout session for maintaining back precautions due to  pt. attempting frequently to bend.   Bed Mobility  Bed Mobility Yes  Rolling Left 1: +2 Total assist;Patient percentage (comment) (pt=50%)  Rolling Left Details (indicate cue type and reason) Mod verbal cues for log rolling technique  Left Sidelying to Sit 1: +2 Total assist;Patient percentage (comment);With rails (pt=50%)  RUE Assessment  RUE  Assessment WFL  LUE Assessment  LUE Assessment WFL  OT - End of Session  Equipment Utilized During Treatment Gait belt;Back brace  Activity Tolerance Patient tolerated treatment well  Patient left in chair;with call bell in reach  Nurse Communication Mobility status for transfers  General  Cognition [facility] for tasks performed  OT Assessment  Clinical Impression Statement Pt. 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  S/P TLIF and with increased pain. pt. will benefit from skilled OT to increase functional independence at D/C to next venue of care by getting pt. to min assist level.  OT Recommendation/Assessment Patient will need skilled OT in the acute care venue  OT Problem List Decreased strength;Decreased activity tolerance;Decreased knowledge of use of DME or AE;Decreased knowledge of precautions;Pain  Barriers to Discharge Decreased caregiver support  OT Therapy Diagnosis  Acute pain  OT Plan  OT Frequency Min 2X/week  OT Treatment/Interventions Self-care/ADL training;DME and/or AE instruction;Therapeutic activities;Patient/family education;Balance training  OT Recommendation  Follow Up Recommendations Inpatient Rehab  Equipment Recommended Defer to next venue  Individuals Consulted  Consulted and Agree with Results and Recommendations Patient  Acute Rehab OT Goals  OT Goal Formulation With patient  Time For Goal Achievement 2 weeks  ADL Goals  Pt Will Perform Grooming with set-up;with supervision;Standing at sink  Pt Will Perform Lower Body Bathing with set-up;with supervision;Sit to stand from chair;with adaptive equipment  Pt Will Perform Lower Body Dressing with set-up;with supervision;with adaptive equipment;Sit to stand from chair  Pt Will Transfer to Toilet with min assist;with DME;Ambulation;3-in-1  Additional ADL Goal #1 Pt. will recall 3/3 back precautions.  ADL Goal: Additional Goal #1 - Progress Goal set today  Pt Will Perform Toileting - Hygiene with  set-up;with supervision;Sit to stand from 3-in-1/toilet;with adaptive equipment  ADL Goal: Grooming - Progress Goal set today  ADL Goal: Lower Body Bathing - Progress Goal set today  ADL Goal: Lower Body Dressing - Progress Goal set today  ADL Goal: Toilet Transfer - Progress Goal set today  ADL Goal: Toileting - Hygiene - Progress Goal set today    [name] [name], OTR/L Pager: [phone] 05/14/2011 .

## 2011-05-14 NOTE — Evaluation (Signed)
Physical Therapy Evaluation Patient Details Name: [name] [name] MRN: [id number] DOB: [birth year] Today's Date: 05/14/2011  Problem List:  Patient Active Problem List  Diagnoses  . HEPATITIS C  . HYPERLIPIDEMIA  . GOUTY ARTHROPATHY UNSPECIFIED  . HEPARIN-INDUCED THROMBOCYTOPENIA  . SCHIZOPHRENIA  . SUBSTANCE ABUSE, MULTIPLE  . HYPERTENSION  . GERD  . ARTHRITIS, LEFT SHOULDER  . INSOMNIA  . PANCREATITIS, HX OF  . Acute pancreatitis  . Hyperkalemia  . Hyponatremia  . Transaminitis  . Hyperglycemia  . Wound infection L Achilles 7 years post-op, acute    Past Medical History:  Past Medical History  Diagnosis Date  . Hypertension   . H/O ETOH abuse   . Colitis   . GERD (gastroesophageal reflux disease)   . Hepatitis     HEP  C  . Cocaine abuse     HISTORY OF  . 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 Diverticulitis     H/O  . Headache   . Arthritis   . Schizophrenia, schizo-affective   . Schizo affective schizophrenia     TAKES ABILIFY  IF  NEEDED   Past Surgical History:  Past Surgical History  Procedure Date  . Back surgery     8 MTHS AGO  . Left achilles   . Tonsillectomy   . Knee arthroscopy   . I&d extremity 03/20/2011    Procedure: IRRIGATION AND DEBRIDEMENT EXTREMITY;  Surgeon: Kerin Salen, MD;  Location: [facility];  Service: Orthopedics;  Laterality: Left;  I&D LEFT ACHILLIES TENDON  . Eye surgery     right eye    PT Assessment/Plan/Recommendation PT Assessment Clinical Impression Statement: pt adm with DDD, stenosis and radiculopathy at L34 L45.  S/P TLIF.  Presently, pt is in alot of pain, has weakness and with the TLSO has significant mobility issues.  Recommend CIR consult for a CIR stay prior to D/C home PT Recommendation/Assessment: Patient will need skilled PT in the acute care venue Barriers to Discharge: Decreased caregiver support PT Therapy Diagnosis : Difficulty walking;Generalized weakness;Acute pain PT Plan PT  Frequency: Min 5X/week PT Recommendation Follow Up Recommendations: Inpatient Rehab Equipment Recommended: Defer to next venue PT Goals  Acute Rehab PT Goals PT Goal Formulation: With patient Time For Goal Achievement: 7 days Pt will go Supine/Side to Sit: with supervision PT Goal: Supine/Side to Sit - Progress: Goal set today Pt will go Sit to Stand: with supervision PT Goal: Sit to Stand - Progress: Goal set today Pt will Transfer Bed to Chair/Chair to Bed: with supervision PT Transfer Goal: Bed to Chair/Chair to Bed - Progress: Goal set today Pt will Ambulate: 51 - 150 feet;with supervision;with least restrictive assistive device PT Goal: Ambulate - Progress: Goal set today  PT Evaluation Precautions/Restrictions  Precautions Precautions: Back Required Braces or Orthoses: Spinal Brace Spinal Brace: Thoracolumbosacral orthotic Restrictions Weight Bearing Restrictions: No Prior Functioning  Home Living Lives With: Alone Available Help at Discharge: Friend(s);Available PRN/intermittently Type of Home: House Home Access: Stairs to enter [employer] of Steps: 1 Entrance Stairs-Rails: None Home Layout: One level Bathroom Shower/Tub: Product/process development scientist: Standard Bathroom Accessibility: Yes How Accessible: Accessible via walker Home Adaptive Equipment: Straight cane Prior Function Level of Independence: Independent Able to Take Stairs?: Yes Driving: No Vocation: On disability Cognition Cognition Overall Cognitive Status: Appears within functional limits for tasks assessed/performed Arousal/Alertness: Awake/alert Orientation Level: Oriented X4 / Intact Behavior During Session: [facility] for tasks performed Sensation/Coordination Sensation Light Touch: Appears Intact  Coordination Gross Motor Movements are Fluid and Coordinated: Yes Extremity Assessment RUE Assessment RUE Assessment: Within Functional Limits LUE Assessment LUE Assessment:  Within Functional Limits RLE Assessment RLE Assessment: Within Functional Limits LLE Assessment LLE Assessment: Within Functional Limits Mobility (including Balance) Bed Mobility Bed Mobility: Yes Rolling Left: 1: +2 Total assist;Patient percentage (comment) (pt=50%) Rolling Left Details (indicate cue type and reason): vc's for logroll technique; truncal assist to roll Left Sidelying to Sit: 1: +2 Total assist;Patient percentage (comment);With rails (pt=50%) Left Sidelying to Sit Details (indicate cue type and reason): vc's for hand placement; truncal assist to come to Sit Transfers Transfers: Yes Sit to Stand: 1: +2 Total assist;Patient percentage (comment);With upper extremity assist;From bed;Other (comment) (pt =60%) Sit to Stand Details (indicate cue type and reason): vc's for hand placement; lifting assist due to pain/ weakness Stand to Sit: 3: Mod assist Ambulation/Gait Ambulation/Gait: Yes Ambulation/Gait Assistance: 4: Min assist Ambulation/Gait Assistance Details (indicate cue type and reason): consistent VC for RW use/ proximity to RW Ambulation Distance (Feet): 35 Feet Assistive device: Rolling walker Gait Pattern: Step-through pattern;Decreased step length - right;Decreased step length - left;Decreased stride length;Trunk flexed  Posture/Postural Control Posture/Postural Control: No significant limitations Balance Balance Assessed: No Exercise    End of Session PT - End of Session Activity Tolerance: Patient tolerated treatment well Patient left: in chair;with call bell in reach Nurse Communication: Mobility status for transfers General Behavior During Session: [facility] for tasks performed Cognition: [facility] for tasks performed  Suriyah Vergara, [name] 05/14/2011, 3:07 PM  05/14/2011  [name], [place] [phone] (pager)

## 2011-05-15 MED ORDER — MORPHINE SULFATE (PF) 1 MG/ML IV SOLN
INTRAVENOUS | Status: AC
  Administered 2011-05-15: 20:00:00
  Filled 2011-05-15: qty 25

## 2011-05-15 MED ORDER — MORPHINE SULFATE (PF) 1 MG/ML IV SOLN
INTRAVENOUS | Status: AC
  Administered 2011-05-15: 13.49 mg
  Filled 2011-05-15: qty 25

## 2011-05-15 MED FILL — Sodium Chloride Irrigation Soln 0.9%: Qty: 3000 | Status: AC

## 2011-05-15 MED FILL — Heparin Sodium (Porcine) Inj 1000 Unit/ML: INTRAMUSCULAR | Qty: 30 | Status: AC

## 2011-05-15 MED FILL — Sodium Chloride IV Soln 0.9%: INTRAVENOUS | Qty: 1000 | Status: AC

## 2011-05-15 NOTE — Progress Notes (Signed)
Physical Therapy Treatment Patient Details Name: [name] [name] MRN: [id number] DOB: 05-24-1955 Today's Date: 05/15/2011 Time: 1126-1202 PT Time Calculation (min): 36 min  PT Assessment / Plan / Recommendation Comments on Treatment Session  Reinforced log roll, transistion to sit  and progression of activtiy, pt verbalized.  Pt still needing rehab before D/C home    Follow Up Recommendations  Inpatient Rehab;Other (comment) (versus SNF if CIR not approved)    Equipment Recommendations  Defer to next venue    Frequency Min 5X/week   Plan Discharge plan remains appropriate    Precautions / Restrictions Precautions Precautions: Back Required Braces or Orthoses: Spinal Brace Spinal Brace: Thoracolumbosacral orthotic Restrictions Weight Bearing Restrictions: No   Pertinent Vitals/Pain     Mobility  Bed Mobility Bed Mobility: Rolling Right;Right Sidelying to Sit Rolling Right: 3: Mod assist Right Sidelying to Sit: 3: Mod assist;HOB flat Details for Bed Mobility Assistance: vc's for log roll technique; truncal assist to assist with R UE Transfers Transfers: Sit to Stand;Stand to Sit Sit to Stand: 4: Min assist Stand to Sit: 3: Mod assist Details for Transfer Assistance: vc's for hand placement ; assist to help bring patient forward over his BOS and control descent Ambulation/Gait Ambulation/Gait Assistance: 4: Min guard Ambulation Distance (Feet): 50 Feet Assistive device: Rolling walker Ambulation/Gait Assistance Details: vc's for posture and safe proximity and use of the RW Gait Pattern: Step-through pattern;Decreased step length - right;Decreased step length - left;Decreased stride length;Trunk flexed Stairs: No    Exercises     PT Goals Acute Rehab PT Goals PT Goal Formulation: With patient PT Goal: Supine/Side to Sit - Progress: Progressing toward goal PT Goal: Sit to Stand - Progress: Progressing toward goal PT Transfer Goal: Bed to Chair/Chair to Bed -  Progress: Progressing toward goal PT Goal: Ambulate - Progress: Progressing toward goal  Visit Information  Last PT Received On: 05/15/11 Assistance Needed: +2    Subjective Data  Subjective: I've been telling everyone to take the brace off from one side only Patient Stated Goal: home independence   Cognition  Overall Cognitive Status: Appears within functional limits for tasks assessed/performed Arousal/Alertness: Awake/alert Orientation Level: Oriented X4 / Intact Behavior During Session: [facility] for tasks performed    Balance  Balance Balance Assessed: No  End of Session PT - End of Session Equipment Utilized During Treatment: Back brace Activity Tolerance: Patient tolerated treatment well Patient left: in chair;with call bell/phone within reach Nurse Communication: Mobility status    [name], [name] 05/15/2011, 2:33 PM  05/15/2011  [name], PT [phone] (pager)

## 2011-05-15 NOTE — Progress Notes (Signed)
Patient progressing slowly.  Improved back pain, no leg pain.  BP 136/70  Pulse 79  Temp(Src) 99.1 F (37.3 C) (Oral)  Resp 20  Ht 6' (1.829 m)  Wt 119.9 kg (264 lb 5.3 oz)  BMI 35.85 kg/m2  SpO2 100%  NVi Dressing CDI  POd #2 after lumbar decompression and fusion  - continue PT OT - may transfer to rehab today if deemed appropriate vs. HHPT - percocet/valium for pain and spasms

## 2011-05-15 NOTE — Progress Notes (Signed)
Rehab admissions - Evaluated for possible admission.  I spoke with on-site insurance case Freight forwarder.  [employer] will not authorize for an inpatient rehab stay.  However, patient would get authorization for SNF or for home with [facility] therapies.  Patient lives alone and feels he may need SNF level therapies.  Call me for questions.  [id number].

## 2011-05-15 NOTE — Progress Notes (Signed)
Clinical Social Work Department BRIEF PSYCHOSOCIAL ASSESSMENT 05/15/2011  Patient:  THIELEN, SHELHAMER     Account Number:  [id number]     Admit date:  05/13/2011  Clinical Social Worker:  Lehman Prom  Date/Time:  05/15/2011 03:45 PM  Referred by:  Physician  Date Referred:  05/15/2011 Referred for  SNF Placement   Other Referral:   Interview type:  Patient Other interview type:    PSYCHOSOCIAL DATA Living Status:  ALONE Admitted from facility:   Level of care:   Primary support name:  Angeldaniel Pepitone Primary support relationship to patient:  SIBLING Degree of support available:   adequate.    CURRENT CONCERNS Current Concerns  Post-Acute Placement   Other Concerns:    SOCIAL WORK ASSESSMENT / PLAN CSW met with pt to address consult. Pt's insurance declined CIR and inpt rehab is recommending SNF. CSW explained process of SNF. Pt shared that he lives alone and has some family close by who are able to assist him. Pt is agreeable to SNF, however would like to think about placement.   Assessment/plan status:  Other - See comment Other assessment/ plan:   CSW will follow up with pt and initate SNF search. CSW will continue to follow.   Information/referral to community resources:   As needed.    PATIENT'S/FAMILY'S RESPONSE TO PLAN OF CARE: Pt was very pleasant and oriented. Pt is agreeable to SNF and will confirm discharge plan with CSW.   Darden Dates, MSW, [place]

## 2011-05-16 NOTE — Progress Notes (Signed)
Physical Therapy Treatment Patient Details Name: [name] [name] MRN: [id number] DOB: 08/04/1955 Today's Date: 05/16/2011 Time: [id number] PT Time Calculation (min): 22 min  PT Assessment / Plan / Recommendation Comments on Treatment Session  Pt motivated to increase mobility.  Reinforced precautions.  Still requiring +2 assist to don brace in standing.    Follow Up Recommendations  Inpatient Rehab    Equipment Recommendations  Defer to next venue    Frequency Min 5X/week   Plan Discharge plan remains appropriate    Precautions / Restrictions Precautions Precautions: Back Precaution Comments: Reviewed back precautions Required Braces or Orthoses: Spinal Brace Spinal Brace: Thoracolumbosacral orthotic (with hip extension component) Restrictions Weight Bearing Restrictions: No   Pertinent Vitals/Pain 5 out of 10 back pain-RN notified    Mobility  Bed Mobility Bed Mobility: Rolling Right;Right Sidelying to Sit Rolling Right: 3: Mod assist Right Sidelying to Sit: 4: Min assist;HOB flat Details for Bed Mobility Assistance: vc's for log roll technique; truncal assist to assist with R UE Transfers Transfers: Sit to Stand;Stand to Sit Sit to Stand: 4: Min assist;With upper extremity assist;From bed Stand to Sit: 3: Mod assist;With upper extremity assist;With armrests;To chair/3-in-1 Details for Transfer Assistance: verbal cues for hand placement.  Poor control over descent to chair. Ambulation/Gait Ambulation/Gait Assistance: 4: Min guard Ambulation Distance (Feet): 100 Feet Assistive device: Rolling walker Ambulation/Gait Assistance Details: Verbal cues for upright posture and safety. Gait Pattern: Trunk flexed;Step-through pattern;Decreased step length - right;Decreased step length - left Stairs: No Wheelchair Mobility Wheelchair Mobility: No    Exercises     PT Goals Acute Rehab PT Goals PT Goal: Supine/Side to Sit - Progress: Progressing toward goal PT Goal: Sit to  Stand - Progress: Progressing toward goal PT Transfer Goal: Bed to Chair/Chair to Bed - Progress: Progressing toward goal PT Goal: Ambulate - Progress: Progressing toward goal  Visit Information  Last PT Received On: 05/16/11 Assistance Needed: +2 (to don brace)    Subjective Data  Subjective: I told them this morning they were taking the brace off wrong.   Cognition  Overall Cognitive Status: Appears within functional limits for tasks assessed/performed Arousal/Alertness: Awake/alert Orientation Level: Oriented X4 / Intact Behavior During Session: [facility] for tasks performed    Balance  Static Standing Balance Static Standing - Balance Support: Bilateral upper extremity supported Static Standing - Level of Assistance: 5: Stand by assistance Static Standing - Comment/# of Minutes: 5  End of Session PT - End of Session Equipment Utilized During Treatment: Back brace Activity Tolerance: Patient tolerated treatment well Patient left: in chair;with call bell/phone within reach;with family/visitor present Nurse Communication: Mobility status    Narda Bonds 05/16/2011, 12:53 PM  Narda Bonds, PTA Acute Rehab [phone] (office)

## 2011-05-16 NOTE — Progress Notes (Signed)
Subjective: 3 Days Post-Op Procedure(s) (LRB): POSTERIOR LUMBAR FUSION 2 LEVEL (Left)  Activity level:  Up in room with brace on Diet tolerance:  eating Voiding:  ok Patient reports pain as 5 on 0-10 scale.    Objective: Vital signs in last 24 hours: Temp:  [98 F (36.7 C)-99.5 F (37.5 C)] 98 F (36.7 C) (04/20 0631) Pulse Rate:  [65-83] 65  (04/20 0631) Resp:  [16-20] 18  (04/20 0631) BP: (115-125)/(56-80) 125/56 mmHg (04/20 0631) SpO2:  [92 %-98 %] 98 % (04/20 0631) FiO2 (%):  [95 %] 95 % (04/19 2014)  Labs:  Basename 05/13/11 1544  HGB 12.9*    Basename 05/13/11 1544  WBC --  RBC --  HCT 38.0*  PLT --    Basename 05/13/11 1544  NA 140  K 4.5  CL --  CO2 --  BUN --  CREATININE --  GLUCOSE --  CALCIUM --   No results found for this basename: LABPT:2,INR:2 in the last 72 hours  Physical Exam:  Neurologically intact ABD soft Neurovascular intact Sensation intact distally Intact pulses distally Dorsiflexion/Plantar flexion intact  Assessment/Plan:  3 Days Post-Op Procedure(s) (LRB): POSTERIOR LUMBAR FUSION 2 LEVEL (Left) Advance diet Up with therapy D/C IV fluids Discharge to SNF  No bed offers as of now , most likely will go to [facility]. Will d/c pca    Haidar Muse R 05/16/2011, 8:23 AM

## 2011-05-17 NOTE — Progress Notes (Addendum)
Clinical Social Work Department CLINICAL SOCIAL WORK PLACEMENT NOTE 05/17/2011  Patient:  SIDDHANT, SHKOLNIK  Account Number:  [id number] Admit date:  05/13/2011  Clinical Social Worker:  Robbi Garter  Date/time:  05/17/2011 02:30 PM  Clinical Social Work is seeking post-discharge placement for this patient at the following level of care:   SKILLED NURSING   (*CSW will update this form in Epic as items are completed)     Patient/family provided with [place] Department of Clinical Social Work's list of facilities offering this level of care within the geographic area requested by the patient (or if unable, by the patient's family).  05/17/2011  Patient/family informed of their freedom to choose among providers that offer the needed level of care, that participate in Medicare, Medicaid or managed care program needed by the patient, have an available bed and are willing to accept the patient.  05/17/2011  Patient/family informed of [facility]' ownership interest in [facility], as well as of the fact that they are under no obligation to receive care at this facility.  PASARR submitted to EDS on  PASARR number received from [place] on   FL2 transmitted to all facilities in geographic area requested by pt/family on  05/17/2011 FL2 transmitted to all facilities within larger geographic area on   Patient informed that his/her managed care company has contracts with or will negotiate with  certain facilities, including the following:     Patient/family informed of bed offers received:  05/18/11 Patient chooses bed at [facility] Physician recommends and patient chooses bed at [facility]  Patient to be transferred to [facility] on  05/18/11 Patient to be transferred to facility by [facility]  The following physician request were entered in Epic:   Additional Comments:  Wandra Feinstein, MSW, [place] (weekend)  Darden Dates, MSW, [place]

## 2011-05-17 NOTE — Progress Notes (Signed)
Occupational Therapy Treatment Patient Details Name: [name] [name] MRN: [id number] DOB: 02/02/1955 Today's Date: 05/17/2011 Time:  -     OT Assessment / Plan / Recommendation Comments on Treatment Session discussed with pt. importance of not sitting up too long vs. not laying in bed too long    Follow Up Recommendations  Inpatient Rehab    Equipment Recommendations  Defer to next venue    Frequency Min 2X/week   Plan Discharge plan remains appropriate    Precautions / Restrictions Precautions Precautions: Back Precaution Comments: reviewed precautions Required Braces or Orthoses: Spinal Brace Spinal Brace: Thoracolumbosacral orthotic Restrictions Weight Bearing Restrictions: No    ADL  Toilet Transfer: Simulated;Minimal assistance Toilet Transfer Method: Stand pivot Toilet Transfer Equipment: Other (comment) (chair to bed) Toileting - Clothing Manipulation: Simulated;Minimal assistance Toileting - Hygiene: Simulated;Minimal assistance Equipment Used: Back brace;Rolling walker Ambulation Related to ADLs: min a with rw with cues for turning completely prior to sitting down ADL Comments: reviewed back precautions and also con't. education with donning/doffing brace, emphasis on pt. providing step-by-step instructions     OT Goals Acute Rehab OT Goals OT Goal Formulation: With patient Potential to Achieve Goals: Good ADL Goals ADL Goal: Grooming - Progress: Progressing toward goals ADL Goal: Toilet Transfer - Progress: Progressing toward goals ADL Goal: Additional Goal #1 - Progress: Progressing toward goals  Visit Information       Subjective Data  Subjective: " you can help me get back to bed"   Prior Functioning       Cognition       Mobility Bed Mobility Bed Mobility: Rolling Left;Sit to Supine Rolling Left: 5: Supervision Sit to Supine: 5: Supervision Details for Bed Mobility Assistance: vc's for log roll tech.   Exercises    Balance    End of  Session OT - End of Session Equipment Utilized During Treatment: Back brace Activity Tolerance: Patient tolerated treatment well Patient left: in bed;with call bell/phone within reach   Janice Coffin 05/17/2011, 12:01 PM

## 2011-05-17 NOTE — Progress Notes (Signed)
Physical Therapy Treatment Patient Details Name: [name] [name] MRN: [id number] DOB: [birth year] Today's Date: 05/17/2011 Time: [id number] PT Time Calculation (min): 21 min  PT Assessment / Plan / Recommendation Comments on Treatment Session  Pt motivated & willing to participate in therapy session    Follow Up Recommendations  Inpatient Rehab    Equipment Recommendations  Defer to next venue    Frequency Min 5X/week   Plan Discharge plan remains appropriate    Precautions / Restrictions Precautions Precautions: Back Precaution Comments: Pt required cueing to recall all 3 precautions.   Required Braces or Orthoses: Spinal Brace Spinal Brace: Thoracolumbosacral orthotic (with hip extension component) Restrictions Weight Bearing Restrictions: No       Mobility  Bed Mobility Bed Mobility: Rolling Left;Sit to Supine Rolling Left: 5: Supervision Sit to Supine: 5: Supervision Details for Bed Mobility Assistance: vc's for log roll tech. Transfers Transfers: Sit to Stand;Stand to Sit Sit to Stand: 4: Min assist;From chair/3-in-1;With upper extremity assist;With armrests Stand to Sit: 4: Min assist;With armrests;To chair/3-in-1;With upper extremity assist Details for Transfer Assistance: Cues for hand placement, body positioning before sitting.  (A) for balance, controlled descent, & safety Ambulation/Gait Ambulation/Gait Assistance: 4: Min guard Ambulation Distance (Feet): 130 Feet Assistive device: Rolling walker Ambulation/Gait Assistance Details: Cues for upright posture, body positioning inside RW, safe use of RW; Per last PT session Progress Note pt was with step-through gait pattern but today he was with step-to pattern.   Gait Pattern: Trunk flexed;Decreased step length - right;Decreased step length - left;Decreased stride length;Step-to pattern Stairs: No Wheelchair Mobility Wheelchair Mobility: No    Exercises     PT Goals Acute Rehab PT Goals PT Goal: Sit to  Stand - Progress: Progressing toward goal PT Goal: Ambulate - Progress: Not met  Visit Information  Last PT Received On: 05/17/11 Assistance Needed: +2 (To donn brace)    Subjective Data      Cognition  Overall Cognitive Status: Appears within functional limits for tasks assessed/performed Arousal/Alertness: Awake/alert Orientation Level: Oriented X4 / Intact Behavior During Session: [facility] for tasks performed    Balance  Balance Balance Assessed: No  End of Session PT - End of Session Equipment Utilized During Treatment: Back brace Activity Tolerance: Patient tolerated treatment well Patient left: in chair;with call bell/phone within reach    Sena Hitch 05/17/2011, 12:26 PM [phone]

## 2011-05-17 NOTE — Progress Notes (Signed)
Subjective: 4 Days Post-Op Procedure(s) (LRB): POSTERIOR LUMBAR FUSION 2 LEVEL (Left)  Activity level:  oob with brace Diet tolerance:  Eating  Voiding:  ok Patient reports pain as 4 on 0-10 scale.    Objective: Vital signs in last 24 hours: Temp:  [98.2 F (36.8 C)-98.3 F (36.8 C)] 98.3 F (36.8 C) (04/21 0635) Pulse Rate:  [58-61] 59  (04/21 0635) Resp:  [16-18] 18  (04/21 0635) BP: (103-136)/(66-77) 136/75 mmHg (04/21 0635) SpO2:  [95 %-99 %] 99 % (04/21 0635)  Labs: No results found for this basename: HGB:5 in the last 72 hours No results found for this basename: WBC:2,RBC:2,HCT:2,PLT:2 in the last 72 hours No results found for this basename: NA:2,K:2,CL:2,CO2:2,BUN:2,CREATININE:2,GLUCOSE:2,CALCIUM:2 in the last 72 hours No results found for this basename: LABPT:2,INR:2 in the last 72 hours  Physical Exam:  Neurologically intact ABD soft Neurovascular intact Sensation intact distally Intact pulses distally Dressing dry  Assessment/Plan:  4 Days Post-Op Procedure(s) (LRB): POSTERIOR LUMBAR FUSION 2 LEVEL (Left) Advance diet Up with therapy Discharge to SNF when bed found    [name] [name] R 05/17/2011, 10:10 AM

## 2011-05-18 NOTE — Discharge Summary (Signed)
NAME:  [name] [name], [name] [name] NO.:  [id number]  MEDICAL RECORD NO.:  [id number]  LOCATION:  [id number]                         FACILITY:  [facility]  PHYSICIAN:  Phylliss Bob, MD      DATE OF BIRTH:  [birth year]  DATE OF ADMISSION:  05/13/2011 DATE OF DISCHARGE:  05/18/2011                              DISCHARGE SUMMARY   PREOPERATIVE DIAGNOSIS:  L3-4, L4-5 spinal stenosis status post a previous lumbar decompression.  DISCHARGE DIAGNOSIS:  L3-4, L4-5 spinal stenosis status post a previous lumbar decompression.  PROCEDURE PERFORMED:  L3-4, L4-5 decompression and fusion performed on May 13, 2011.  ADMISSION HISTORY:  Briefly, Mr. Beldin is a very pleasant, 56 year old male, who initially was evaluated by me.  He initially did have a decompressive procedure by me about 1 year ago.  The patient did go on to have instability and advancement of his degenerative disk disease and spinal stenosis.  The patient failed conservative care, and the patient was admitted to the hospital on May 13, 2011, for the procedure noted above.  HOSPITAL COURSE:  The patient underwent the procedure reflected above on the date noted above.  The patient tolerated the procedure well and was transferred to recovery in stable condition.  The patient was progressively mobilized throughout his hospital stay.  Of particular note, it was recommended that the patient is being transferred to inpatient rehabilitation facility; however, for whatever reason, it came to my attention that his insurance company would not approve such a stay and arrangements were instead made for the patient to be transferred to a skilled nursing facility.  At the moment of this discharge, the patient's postop day #5 and he did have a facility lined up and the patient is pending discharge today.  The patient was neurovascularly intact throughout his hospital stay, and his dressings were noted to be clean, dry, and  intact.  DISCHARGE INSTRUCTIONS:  The patient will take Percocet for pain and Valium for spasms.  He will follow up in my office in approximately 2 weeks after his surgery.  He will wear his brace whenever he is up and ambulating, and he will adhere to back precautions at all times.     Phylliss Bob, MD     MD/MEDQ  D:  05/18/2011  T:  05/18/2011  Job:  [id number]

## 2011-05-18 NOTE — Progress Notes (Signed)
Patient without complaints.  Has been progressing with PT.  BP 172/93  Pulse 56  Temp(Src) 98.1 F (36.7 C) (Oral)  Resp 18  Ht 6' (1.829 m)  Wt 119.9 kg (264 lb 5.3 oz)  BMI 35.85 kg/m2  SpO2 99%  Dressing CDI NVI  Pt s/p lumbar decompression and fusion  - to SNF today - f/u my office in 1 week - back precautions with brace when up

## 2011-05-18 NOTE — Progress Notes (Signed)
Occupational Therapy Treatment Patient Details Name: [name] [name] MRN: [id number] DOB: [birth year] Today's Date: 05/18/2011 Time: [id number] OT Time Calculation (min): 30 min  OT Assessment / Plan / Recommendation Comments on Treatment Session Making good progress with increasing indepwith ADL.    Follow Up Recommendations  Skilled nursing facility    Equipment Recommendations  Defer to next venue    Frequency Min 1X/week   Plan Discharge plan needs to be updated    Precautions / Restrictions Precautions Precautions: Back Precaution Booklet Issued: Yes (comment) Precaution Comments: Pt able to recall 2/3 back precautions.  Reviewed all 3 precautions.  Required Braces or Orthoses: Spinal Brace Spinal Brace: Thoracolumbosacral orthotic (with hip extension component) Restrictions Weight Bearing Restrictions: No   Pertinent Vitals/Pain Pain - 4    ADL  Lower Body Bathing: Simulated;Moderate assistance Where Assessed - Lower Body Bathing: Sit to stand from chair;Unsupported Upper Body Dressing: Performed;Other (comment) (donned TLSO) Where Assessed - Upper Body Dressing: Sit to stand from bed Lower Body Dressing: Performed;Moderate assistance Where Assessed - Lower Body Dressing: Sit to stand from chair;Unsupported;Other (comment) (using TLSO) Toileting - Hygiene: Simulated;Minimal assistance;Other (comment) (discussed use of toilet aid) Where Assessed - Toileting Hygiene: Sit to stand from 3-in-1 or toilet Equipment Used: Back brace;Rolling walker;Gait belt;Long-handled shoe horn;Long-handled sponge;Reacher;Sock aid Ambulation Related to ADLs: supervision RW level ADL Comments: Making excellent progress. Increasing indep with ADL with use of AE. Pt states that he will have his family bring in equipment from home.     OT Goals Acute Rehab OT Goals OT Goal Formulation: With patient Potential to Achieve Goals: Good ADL Goals Pt Will Perform Grooming: with set-up;with  supervision;Standing at sink ADL Goal: Grooming - Progress: Met Pt Will Perform Lower Body Bathing: with set-up;with supervision;Sit to stand from chair;with adaptive equipment ADL Goal: Lower Body Bathing - Progress: Progressing toward goals Pt Will Perform Lower Body Dressing: with set-up;with supervision;with adaptive equipment;Sit to stand from chair ADL Goal: Lower Body Dressing - Progress: Progressing toward goals Pt Will Transfer to Toilet: with min assist;with DME;Ambulation;3-in-1 ADL Goal: Toilet Transfer - Progress: Met Pt Will Perform Toileting - Hygiene: with set-up;with supervision;Sit to stand from 3-in-1/toilet;with adaptive equipment ADL Goal: Toileting - Hygiene - Progress: Progressing toward goals Additional ADL Goal #1: Pt. will recall 3/3 back precautions. ADL Goal: Additional Goal #1 - Progress: Progressing toward goals  Visit Information  Last OT Received On: 05/18/11    Subjective Data      Prior Functioning       Cognition  Overall Cognitive Status: Appears within functional limits for tasks assessed/performed Arousal/Alertness: Awake/alert Orientation Level: Oriented X4 / Intact Behavior During Session: [facility] for tasks performed    Mobility Bed Mobility Bed Mobility: Right Sidelying to Sit Rolling Right: 6: Modified independent (Device/Increase time) Right Sidelying to Sit: 6: Modified independent (Device/Increase time) Transfers Transfers: Sit to Stand;Stand to Sit Sit to Stand: From bed;With upper extremity assist;5: Supervision Stand to Sit: To chair/3-in-1;With armrests;With upper extremity assist;5: Supervision Details for Transfer Assistance: (A) to achieve standing (pt=95%)   Exercises    Balance Balance Balance Assessed: No  End of Session OT - End of Session Equipment Utilized During Treatment: Back brace Activity Tolerance: Patient tolerated treatment well Patient left: in chair;with call bell/phone within reach   [facility] 05/18/2011,  3:03 PM [facility], OTR/L  [phone] 05/18/2011

## 2011-05-18 NOTE — Progress Notes (Signed)
Pt is ready for discharge to [facility]. Pt's discharge delayed due to facility and the inability to obtain medications in a timely manner. PASARR# has been obtained. Pt is agreeable to discharge. RN aware. CSW will facilitate discharge to [facility] 05/19/11.   Darden Dates, MSW, [place]

## 2011-05-18 NOTE — Progress Notes (Addendum)
Physical Therapy Treatment Patient Details Name: [name] [name] MRN: [id number] DOB: Feb 02, 1955 Today's Date: 05/18/2011 Time: [id number] PT Time Calculation (min): 21 min  PT Assessment / Plan / Recommendation Comments on Treatment Session       Follow Up Recommendations  Skilled nursing facility;Other (comment) (due to Insurance not approving CIR stay)    Equipment Recommendations  Defer to next venue    Frequency Min 5X/week   Plan Discharge plan remains appropriate    Precautions / Restrictions Precautions Precautions: Back Precaution Comments: Pt able to recall 2/3 back precautions.  Reviewed all 3 precautions.  Required Braces or Orthoses: Spinal Brace Spinal Brace: Thoracolumbosacral orthotic (with hip extension component) Restrictions Weight Bearing Restrictions: No       Mobility  Transfers Transfers: Sit to Stand;Stand to Sit Sit to Stand: From bed;With upper extremity assist;4: Min assist Stand to Sit: 4: Min guard;To chair/3-in-1;With armrests;With upper extremity assist Details for Transfer Assistance: (A) to achieve standing (pt=95%) Ambulation/Gait Ambulation/Gait Assistance: 5: Supervision Ambulation Distance (Feet): 200 Feet Assistive device: Rolling walker Ambulation/Gait Assistance Details: Cues for increased upright posture, relax UE's Gait Pattern: Step-through pattern;Decreased stride length Stairs: No Wheelchair Mobility Wheelchair Mobility: No    Exercises     PT Goals Acute Rehab PT Goals PT Goal: Sit to Stand - Progress: Progressing toward goal PT Goal: Ambulate - Progress: Progressing toward goal  Visit Information  Last PT Received On: 05/18/11    Subjective Data  Subjective: It feels better when I get up & walk   Cognition  Overall Cognitive Status: Appears within functional limits for tasks assessed/performed Arousal/Alertness: Awake/alert Orientation Level: Oriented X4 / Intact Behavior During Session: [facility] for tasks  performed    Balance  Balance Balance Assessed: No  End of Session PT - End of Session Equipment Utilized During Treatment: Back brace Activity Tolerance: Patient tolerated treatment well Patient left: in chair;with call bell/phone within reach    Sena Hitch 05/18/2011, 1:16 PM [id number]

## 2011-05-19 NOTE — Progress Notes (Signed)
Pt is ready for discharge to [facility]. Facility is ready for pt as they have received discharge summary. [facility] will provide transportation. Pt is agreeable to discharge plan. CSW left a message with pt's family. CSW is signing off as no further clinical social work needs identified.   Darden Dates, MSW, [place]

## 2011-05-19 NOTE — Progress Notes (Signed)
Physical Therapy Treatment Patient Details Name: [name] [name] MRN: [id number] DOB: 07-13-1955 Today's Date: 05/19/2011 Time: [id number] PT Time Calculation (min): 16 min  PT Assessment / Plan / Recommendation Comments on Treatment Session       Follow Up Recommendations  Skilled nursing facility;Other (comment)    Equipment Recommendations  Defer to next venue    Frequency Min 5X/week   Plan Discharge plan remains appropriate    Precautions / Restrictions Precautions Precautions: Back Required Braces or Orthoses: Spinal Brace Spinal Brace: Thoracolumbosacral orthotic (with hip extension component Lt side) Restrictions Weight Bearing Restrictions: No       Mobility  Bed Mobility Bed Mobility: Rolling Right;Right Sidelying to Sit Rolling Right: 6: Modified independent (Device/Increase time);With rail Right Sidelying to Sit: 6: Modified independent (Device/Increase time);With rails;HOB flat Transfers Transfers: Sit to Stand;Stand to Sit Sit to Stand: From bed;With upper extremity assist;From chair/3-in-1;With armrests;4: Min guard Stand to Sit: With upper extremity assist;To chair/3-in-1;With armrests;4: Min guard Details for Transfer Assistance: Cues for hand placement Ambulation/Gait Ambulation/Gait Assistance: 5: Supervision Ambulation Distance (Feet): 220 Feet Assistive device: Rolling walker Ambulation/Gait Assistance Details: Min cueing for increased upright posture & decrease reliance of UE"s on RW.   Gait Pattern: Step-through pattern;Decreased stride length Stairs: No Wheelchair Mobility Wheelchair Mobility: No    Exercises     PT Goals Acute Rehab PT Goals PT Goal: Supine/Side to Sit - Progress: Met PT Goal: Sit to Stand - Progress: Progressing toward goal PT Goal: Ambulate - Progress: Met  Visit Information  Last PT Received On: 05/19/11 Assistance Needed: +1    Subjective Data      Cognition  Overall Cognitive Status: Appears within  functional limits for tasks assessed/performed Arousal/Alertness: Awake/alert Orientation Level: Oriented X4 / Intact Behavior During Session: [facility] for tasks performed    Balance  Balance Balance Assessed: No  End of Session PT - End of Session Equipment Utilized During Treatment: Back brace Activity Tolerance: Patient tolerated treatment well Patient left: in chair;with call bell/phone within reach    Sena Hitch 05/19/2011, 2:30 PM [phone]

## 2011-05-19 NOTE — Progress Notes (Signed)
Physical Therapy Treatment Patient Details Name: [name] [name] MRN: [id number] DOB: [birth year] Today's Date: 05/18/2011 Time: [id number] PT Time Calculation (min): 21 min  PT Assessment / Plan / Recommendation Comments on Treatment Session       Follow Up Recommendations  Skilled nursing facility;Other (comment) (due to Insurance not approving CIR stay)    Equipment Recommendations  Defer to next venue    Frequency Min 5X/week   Plan Discharge plan remains appropriate    Precautions / Restrictions Precautions Precautions: Back Precaution Comments: Pt able to recall 2/3 back precautions.  Reviewed all 3 precautions.  Required Braces or Orthoses: Spinal Brace Spinal Brace: Thoracolumbosacral orthotic (with hip extension component) Restrictions Weight Bearing Restrictions: No       Mobility  Transfers Transfers: Sit to Stand;Stand to Sit Sit to Stand: From bed;With upper extremity assist;4: Min assist Stand to Sit: 4: Min guard;To chair/3-in-1;With armrests;With upper extremity assist Details for Transfer Assistance: (A) to achieve standing (pt=95%) Ambulation/Gait Ambulation/Gait Assistance: 5: Supervision Ambulation Distance (Feet): 200 Feet Assistive device: Rolling walker Ambulation/Gait Assistance Details: Cues for increased upright posture, relax UE's Gait Pattern: Step-through pattern;Decreased stride length Stairs: No Wheelchair Mobility Wheelchair Mobility: No    Exercises     PT Goals Acute Rehab PT Goals PT Goal: Sit to Stand - Progress: Progressing toward goal PT Goal: Ambulate - Progress: Progressing toward goal  Visit Information  Last PT Received On: 05/18/11    Subjective Data  Subjective: It feels better when I get up & walk   Cognition  Overall Cognitive Status: Appears within functional limits for tasks assessed/performed Arousal/Alertness: Awake/alert Orientation Level: Oriented X4 / Intact Behavior During Session: [facility] for tasks  performed    Balance  Balance Balance Assessed: No  End of Session PT - End of Session Equipment Utilized During Treatment: Back brace Activity Tolerance: Patient tolerated treatment well Patient left: in chair;with call bell/phone within reach    Sena Hitch 05/18/2011, 1:16 PM [id number] I agree with the above statement.  Kittie Plater, PT, DPT Pager #: [phone] Office #: [phone]

## 2011-05-19 NOTE — Progress Notes (Signed)
Utilization review completed. Rozanna Boer, RN, BSN.  05/19/11

## 2011-09-05 ENCOUNTER — Encounter (HOSPITAL_COMMUNITY): Payer: Self-pay | Admitting: *Deleted

## 2011-09-05 ENCOUNTER — Emergency Department (HOSPITAL_COMMUNITY)
Admission: EM | Admit: 2011-09-05 | Discharge: 2011-09-05 | Disposition: A | Discharge status: Home / Self Care | Payer: PRIVATE HEALTH INSURANCE | Attending: Emergency Medicine | Admitting: Emergency Medicine

## 2011-09-05 DIAGNOSIS — B1920 Unspecified viral hepatitis C without hepatic coma: Secondary | ICD-10-CM | POA: Insufficient documentation

## 2011-09-05 DIAGNOSIS — F259 Schizoaffective disorder, unspecified: Secondary | ICD-10-CM | POA: Insufficient documentation

## 2011-09-05 DIAGNOSIS — I10 Essential (primary) hypertension: Secondary | ICD-10-CM | POA: Insufficient documentation

## 2011-09-05 DIAGNOSIS — Z87891 Personal history of nicotine dependence: Secondary | ICD-10-CM | POA: Insufficient documentation

## 2011-09-05 DIAGNOSIS — K219 Gastro-esophageal reflux disease without esophagitis: Secondary | ICD-10-CM | POA: Insufficient documentation

## 2011-09-05 DIAGNOSIS — M129 Arthropathy, unspecified: Secondary | ICD-10-CM | POA: Insufficient documentation

## 2011-09-05 DIAGNOSIS — R109 Unspecified abdominal pain: Secondary | ICD-10-CM

## 2011-09-05 DIAGNOSIS — R1013 Epigastric pain: Secondary | ICD-10-CM | POA: Insufficient documentation

## 2011-09-05 LAB — LIPASE, BLOOD: Lipase: 118 U/L — ABNORMAL HIGH (ref 11–59)

## 2011-09-05 LAB — URINALYSIS, ROUTINE W REFLEX MICROSCOPIC
Nitrite: NEGATIVE
Protein, ur: >300 mg/dL — AB
Specific Gravity, Urine: 1.044 — ABNORMAL HIGH (ref 1.005–1.030)
Urobilinogen, UA: 1 mg/dL (ref 0.0–1.0)

## 2011-09-05 LAB — COMPREHENSIVE METABOLIC PANEL
ALT: 67 U/L — ABNORMAL HIGH (ref 0–53)
AST: 91 U/L — ABNORMAL HIGH (ref 0–37)
Alkaline Phosphatase: 130 U/L — ABNORMAL HIGH (ref 39–117)
CO2: 21 mEq/L (ref 19–32)
Chloride: 101 mEq/L (ref 96–112)
GFR calc Af Amer: >90 mL/min (ref >90)
GFR calc non Af Amer: >90 mL/min (ref >90)
Glucose, Bld: 108 mg/dL — ABNORMAL HIGH (ref 70–99)
Potassium: 4.2 mEq/L (ref 3.5–5.1)
Sodium: 134 mEq/L — ABNORMAL LOW (ref 135–145)
Total Bilirubin: 0.9 mg/dL (ref 0.3–1.2)

## 2011-09-05 LAB — CBC WITH DIFFERENTIAL/PLATELET
Basophils Absolute: 0 10*3/uL (ref 0.0–0.1)
Basophils Relative: 0 % (ref 0–1)
Eosinophils Absolute: 0.1 10*3/uL (ref 0.0–0.7)
Hemoglobin: 16.1 g/dL (ref 13.0–17.0)
MCH: 29.2 pg (ref 26.0–34.0)
MCHC: 34.2 g/dL (ref 30.0–36.0)
Monocytes Relative: 7 % (ref 3–12)
Neutro Abs: 6.8 10*3/uL (ref 1.7–7.7)
Neutrophils Relative %: 77 % (ref 43–77)
RDW: 14.8 % (ref 11.5–15.5)

## 2011-09-05 LAB — URINE MICROSCOPIC-ADD ON

## 2011-09-05 MED ORDER — METOCLOPRAMIDE HCL 10 MG PO TABS: 10.0000 mg | ORAL_TABLET | Freq: Four times a day (QID) | ORAL | Status: DC

## 2011-09-05 MED ORDER — HYDROMORPHONE HCL 2 MG PO TABS: 2.0000 mg | ORAL_TABLET | ORAL | Status: DC | PRN

## 2011-09-05 MED ORDER — METOCLOPRAMIDE HCL 5 MG/ML IJ SOLN
10.0000 mg | Freq: Once | INTRAMUSCULAR | Status: AC
  Administered 2011-09-05: 10 mg via INTRAMUSCULAR
  Filled 2011-09-05: qty 2

## 2011-09-05 MED ORDER — HYDROMORPHONE HCL PF 1 MG/ML IJ SOLN
1.0000 mg | Freq: Once | INTRAMUSCULAR | Status: AC
  Administered 2011-09-05: 1 mg via INTRAMUSCULAR
  Filled 2011-09-05: qty 1

## 2011-09-05 MED ORDER — OXYCODONE-ACETAMINOPHEN 5-325 MG PO TABS
2.0000 | ORAL_TABLET | Freq: Once | ORAL | Status: AC
  Administered 2011-09-05: 2 via ORAL
  Filled 2011-09-05: qty 2

## 2011-09-05 NOTE — ED Notes (Signed)
Pt. Reports abdominal pain starting yesterday. States pain is all over but worse in left lower quadrant. States it feels like his pancreatitis pain. Reports N/V since yesterday.

## 2011-09-05 NOTE — ED Notes (Signed)
Pt is here with abdominal pain all over but more on left lower area.  Pt reports vomiting yesterday and some today.  Reports soft stools and no diarrhea.  Pt has had pancreatitis before.  Pt recently started back drinking ETOH.  Last ETOH was on Friday.  No coffee ground emesis.

## 2011-09-05 NOTE — ED Notes (Signed)
Prescriptions x2 given with discharge instructions.  

## 2011-09-05 NOTE — ED Provider Notes (Signed)
History     CSN: [id number]  Arrival date & time 09/05/11  1722   First MD Initiated Contact with Patient 09/05/11 2009      Chief Complaint  Patient presents with  . Abdominal Pain    (Consider location/radiation/quality/duration/timing/severity/associated sxs/prior treatment) HPI Comments: Patient presents with 2 day history of abdominal pain. He reports a gradual onset of a cramping pain in his upper abdomen that has gradually gotten worse since the onset. He currently rates the pain 8/10. He also reports having a stabbing pain in his lower left abdomen that started at the same time. The patient reports having nausea and diarrhea as well. He denies fever, chest pain, and shortness of breath. He has a history of acute pancreatitis, which feels "exactly like this pain" according to the patient. He had a period of sobriety which he has recently broken with drinking "about a quart of beer per day." His last drink was 2 days ago.   Patient is a 56 y.o. male presenting with abdominal pain.  Abdominal Pain The primary symptoms of the illness include abdominal pain, nausea and diarrhea. The primary symptoms of the illness do not include fever, fatigue, shortness of breath, vomiting or dysuria.  Symptoms associated with the illness do not include diaphoresis, hematuria or back pain.    Past Medical History  Diagnosis Date  . Hypertension   . H/O ETOH abuse   . Colitis   . GERD (gastroesophageal reflux disease)   . Hepatitis     HEP  C  . Cocaine abuse     HISTORY OF  . 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 Diverticulitis     H/O  . Headache   . Arthritis   . Schizophrenia, schizo-affective   . Schizo affective schizophrenia     TAKES ABILIFY  IF  NEEDED    Past Surgical History  Procedure Date  . Back surgery     8 MTHS AGO  . Left achilles   . Tonsillectomy   . Knee arthroscopy   . I&d extremity 03/20/2011    Procedure: IRRIGATION AND DEBRIDEMENT  EXTREMITY;  Surgeon: Kerin Salen, MD;  Location: [facility];  Service: Orthopedics;  Laterality: Left;  I&D LEFT ACHILLIES TENDON  . Eye surgery     right eye    No family history on file.  History  Substance Use Topics  . Smoking status: Former Smoker -- 1.0 packs/day for 30 years    Types: Cigarettes    Quit date: 03/03/2007  . Smokeless tobacco: Not on file  . Alcohol Use: Yes     frequently-- 1/5th a day      Review of Systems  Constitutional: Negative for fever, diaphoresis and fatigue.  Respiratory: Negative for cough and shortness of breath.   Cardiovascular: Negative for chest pain.  Gastrointestinal: Positive for nausea, abdominal pain and diarrhea. Negative for vomiting, blood in stool and abdominal distention.  Genitourinary: Negative for dysuria and hematuria.  Musculoskeletal: Negative for back pain.  Skin: Negative for rash and wound.  Neurological: Negative for dizziness, weakness, light-headedness and headaches.    Allergies  Thorazine  Home Medications   Current Outpatient Rx  Name Route Sig Dispense Refill  . CLONIDINE HCL 0.3 MG PO TABS Oral Take 0.3 mg by mouth daily.     . FENOFIBRATE 48 MG PO TABS Oral Take 48 mg by mouth daily.    Marland Kitchen LABETALOL HCL 100 MG PO TABS Oral Take 100  mg by mouth 2 (two) times daily.    Marland Kitchen LISINOPRIL 20 MG PO TABS Oral Take 20 mg by mouth daily.    . TRAZODONE HCL 100 MG PO TABS Oral Take 200 mg by mouth at bedtime.        BP 194/107  Pulse 83  Temp 99.2 F (37.3 C) (Oral)  Resp 16  SpO2 98%  Physical Exam  Nursing note and vitals reviewed. Constitutional: He is oriented to person, place, and time. He appears well-developed and well-nourished. No distress.  HENT:  Head: Normocephalic and atraumatic.  Eyes: Conjunctivae are normal. No scleral icterus.  Neck: Normal range of motion.  Cardiovascular: Normal rate and regular rhythm.  Exam reveals no gallop and no friction rub.   No murmur heard. Pulmonary/Chest: Effort  normal and breath sounds normal. He has no wheezes. He has no rales. He exhibits no tenderness.  Abdominal: Soft. There is tenderness.       Notable tenderness to palpation in epigastric area as well as lower left quadrant. Generalized tenderness to palpation all other quadrants.  Musculoskeletal: Normal range of motion.  Neurological: He is alert and oriented to person, place, and time.  Skin: Skin is warm and dry. He is not diaphoretic.  Psychiatric: He has a normal mood and affect. His behavior is normal.    ED Course  Procedures (including critical care time)  Labs Reviewed  COMPREHENSIVE METABOLIC PANEL - Abnormal; Notable for the following:    Sodium 134 (*)     Glucose, Bld 108 (*)     AST 91 (*)     ALT 67 (*)     Alkaline Phosphatase 130 (*)     All other components within normal limits  URINALYSIS, ROUTINE W REFLEX MICROSCOPIC - Abnormal; Notable for the following:    Color, Urine ORANGE (*)  BIOCHEMICALS MAY BE AFFECTED BY COLOR   Specific Gravity, Urine 1.044 (*)     Hgb urine dipstick SMALL (*)     Bilirubin Urine SMALL (*)     Ketones, ur 15 (*)     Protein, ur >300 (*)     Leukocytes, UA TRACE (*)     All other components within normal limits  LIPASE, BLOOD - Abnormal; Notable for the following:    Lipase 118 (*)     All other components within normal limits  URINE MICROSCOPIC-ADD ON - Abnormal; Notable for the following:    Casts HYALINE CASTS (*)     All other components within normal limits  CBC WITH DIFFERENTIAL   No results found.   No diagnosis found.    MDM  10:08 PM Patient having epigastric abdominal pain and is concerning about pancreatitis. I gave him percocet for the pain and will check in with him for pain control. His labs show elevated liver enzymes as well as elevated lipase but not elevation enough to be concerned for acute pancreatitis. Patient will most likely be discharged with pain control. Plan discussed with Dr. Eulis Foster who is  agreeable.   11:14 PM Patient reports the Percocet did not help his pain. I gave him IV dilaudid and reglan which helped. I will discharge the patient with a few dilaudid tablets and reglan for nausea. He reports having an appointment with Internal Medicine at [facility] in September and I have encouraged him to call the clinic and try to get his appointment moved up. I also explained to him that drinking alcohol can elevate pancreatic and liver enzymes and he  should abstain from drinking alcohol. Plan discussed with Dr. Eulis Foster who is agreeable.      Alvina Chou, PA-C 09/10/11 0725

## 2011-09-07 ENCOUNTER — Ambulatory Visit: Payer: PRIVATE HEALTH INSURANCE | Admitting: Rehabilitative and Restorative Service Providers"

## 2011-09-07 ENCOUNTER — Telehealth: Payer: Self-pay | Admitting: *Deleted

## 2011-09-07 NOTE — Telephone Encounter (Signed)
Pt calls and states he has his first appt in oct, went to ED for pancreatic problems and was told to call office for an appt before oct, call is transferred per chilonb. To her for her to assist him w/ his problem

## 2011-09-09 NOTE — Telephone Encounter (Signed)
How soon can he be set up with an appt in clinic?

## 2011-09-11 NOTE — Telephone Encounter (Signed)
Please set up a closer appt for pt than oct per dr Murlean Caller

## 2011-09-11 NOTE — Telephone Encounter (Signed)
Pt is set up for appt Monday 8/19 at 1330, he is ask to be here at 1315 to see dr sadek per Lacy Duverney, pt states as of now he is trying to rest more and eat very lite meals and bland foods to see if he can make it til Monday, states right now he is ok but tired. He is asked that if his condition becomes worse to please go to Ed or urg care.

## 2011-09-11 NOTE — ED Provider Notes (Signed)
Medical screening examination/treatment/procedure(s) were performed by non-physician practitioner and as supervising physician I was immediately available for consultation/collaboration.  Richarda Blade, MD 09/11/11 [phone]

## 2011-09-14 ENCOUNTER — Encounter: Payer: Self-pay | Admitting: Internal Medicine

## 2011-09-14 ENCOUNTER — Other Ambulatory Visit: Payer: Self-pay | Admitting: Orthopedic Surgery

## 2011-09-14 ENCOUNTER — Ambulatory Visit (INDEPENDENT_AMBULATORY_CARE_PROVIDER_SITE_OTHER): Payer: PRIVATE HEALTH INSURANCE | Admitting: Internal Medicine

## 2011-09-14 VITALS — BP 191/99 | HR 54 | Temp 98.7°F | Ht 71.0 in | Wt 257.8 lb

## 2011-09-14 DIAGNOSIS — I10 Essential (primary) hypertension: Secondary | ICD-10-CM

## 2011-09-14 DIAGNOSIS — F1910 Other psychoactive substance abuse, uncomplicated: Secondary | ICD-10-CM

## 2011-09-14 DIAGNOSIS — M19019 Primary osteoarthritis, unspecified shoulder: Secondary | ICD-10-CM

## 2011-09-14 DIAGNOSIS — Z8719 Personal history of other diseases of the digestive system: Secondary | ICD-10-CM

## 2011-09-14 MED ORDER — TRAZODONE HCL 100 MG PO TABS: 100.0000 mg | ORAL_TABLET | Freq: Every day | ORAL | Status: DC

## 2011-09-14 NOTE — Assessment & Plan Note (Signed)
Patient is coming to followup on recent hospital which was on 09/05/11. He reports feeling and doing much better overall, able to tolerate an advanced diet, having no episodes of nausea/vomiting/diarrhea/constipation. He states that he's totally abstained from alcohol since that hospitalization and understands the implications as to the etiology of his recent pancreatitis, and the possible repercussions of his drinking on the outcome of his future surgery. -Patient was educated on etiology of his pancreatitis and the importance of cessation of alcohol -No further workup needed at this time

## 2011-09-14 NOTE — Patient Instructions (Addendum)
Thank you for establishing care with our clinic  Please continue to take your blood pressure medications as prescribed and followup with your preoperation visit with your orthopedic surgeon.  Like to followup with you after your surgery to help work on your blood pressure.  We will be getting records from your previous physician.  Thank you and hope the surgery goes well.

## 2011-09-14 NOTE — Assessment & Plan Note (Signed)
Patient reports that he is scheduled for an arthroscopy of the shoulder and subacromial decompression on 09-17-11 with his orthopedic surgeon. He continues to have baseline pain and limited range of motion of his left shoulder. -Will followup with patient once procedure is performed

## 2011-09-14 NOTE — Assessment & Plan Note (Signed)
Patient reports total abstinence from all illicit drugs and alcohol at this time. -Will continue to monitor or consider UDS at next visit

## 2011-09-14 NOTE — Assessment & Plan Note (Signed)
Patient is hypertensive today in the 190s/90s, patient reports that he was out of his medications since 8/10 and only recently refilled his medications on Friday. He does state that he was continually taking metoprolol but none of his other medications for blood pressure. Even though he began taking medications on Friday his blood pressure still seems to be high. Patient is currently on clonidine 0.3 mg, labetalol 100 mg, lisinopril 20 mg, and furosemide 20 mg. Despite this regiment he continues to be hypertensive, he states he has never had any workup for secondary hypertension causes. -Encouraged patient to continue taking all medications as prescribed in anticipation of his procedure scheduled on 09/17/11. -Will followup with patient 1 week after surgery to monitor blood pressure levels -Would like patient to followup in about 10 days, if patient is physically able after procedure to work on blood pressure management and reevaluation

## 2011-09-14 NOTE — Progress Notes (Addendum)
Subjective:   Patient ID: [name] [name] male   DOB: 07-06-1955 56 y.o.   MRN: [id number]  HPI: [name] [name] is a 56 y.o. African American man with a past medical history of hypertension, schizophrenia, hyperlipidemia, alcohol abuse, and hepatitis C who presents for a hospital followup for acute pancreatitis on 09/05/2011. He reports that he is also trying to establish new care at this clinic today. Overall since his discharge he has been feeling much better. Without any episodes of nausea, vomiting or diarrhea, he is able to tolerate his regular diet, and is not having any more abdominal pain. He states his only pain is in his shoulder that has been chronic for some time and is scheduled to have surgery on 09-17-2011. He states that he has been out of his medications since August 10 but has restarted all of his medications since Friday. He reports total abstinence from alcohol since his recent hospital admission, patient was educated and asked about how continuing to drink would affect his potential surgery postop recovery. Patient also denied any smoking but was a former smoker and any other illicit drugs. Patient lives alone and is on disability and feels safe in his home and his community. He is sexually active with male partners and has about 2-3 new partners every year, with his most current partner for the last 4 months. He has not had any history of STDs in the past and is not worried today. Overall he's been feeling well with no symptoms of headaches, blurry vision, palpitations, presyncopal or syncopal episodes, abdominal pain, diarrhea, constipation, other joint pain apart from his shoulder, and his mood has been stable. He states that he seen by a psychiatrist at [facility] who follows his schizophrenia and he is compliant with his Abilify. His only concern today is that he ran out of his trazodone for sleep and has not been sleeping well for the past 2 days. He denied any new hallucinations  auditory or visual and is not suicidal or homicidal today.    Past Medical History  Diagnosis Date  . Hypertension   . H/O ETOH abuse   . Colitis   . GERD (gastroesophageal reflux disease)   . Hepatitis     HEP  C  . Cocaine abuse     HISTORY OF  . 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 Diverticulitis     H/O  . Headache   . Arthritis   . Schizophrenia, schizo-affective   . Schizo affective schizophrenia     TAKES ABILIFY  IF  NEEDED  . Cataract   . Hyperlipidemia    Current Outpatient Prescriptions  Medication Sig Dispense Refill  . cloNIDine (CATAPRES) 0.3 MG tablet Take 0.3 mg by mouth daily.       . fenofibrate (TRICOR) 48 MG tablet Take 48 mg by mouth daily.      . furosemide (LASIX) 20 MG tablet 20 mg.      . labetalol (NORMODYNE) 100 MG tablet Take 100 mg by mouth 2 (two) times daily.      Marland Kitchen lisinopril (PRINIVIL,ZESTRIL) 20 MG tablet Take 20 mg by mouth daily.      Marland Kitchen oxyCODONE-acetaminophen (PERCOCET/ROXICET) 5-325 MG per tablet       . traZODone (DESYREL) 100 MG tablet Take 1 tablet (100 mg total) by mouth at bedtime.  30 tablet  3  . DISCONTD: traZODone (DESYREL) 100 MG tablet Take 200 mg by mouth at bedtime.  Family History  Problem Relation Age of Onset  . Heart disease Mother   . Heart disease Father   . Heart disease Sister    History   Social History  . Marital Status: Single    Spouse Name: N/A    Number of Children: N/A  . Years of Education: N/A   Social History Main Topics  . Smoking status: Former Smoker -- 1.0 packs/day for 30 years    Types: Cigarettes    Quit date: 03/03/2007  . Smokeless tobacco: None  . Alcohol Use: Yes     frequently-- 1/5th a day  . Drug Use: No     patient denies  . Sexually Active: Yes    Birth Control/ Protection: Condom     2-3 new male partners a year   Other Topics Concern  . None   Social History Narrative  . None   Review of Systems: Pertinent listed in history of  present illness  Objective:  Physical Exam: Filed Vitals:   09/14/11 1334 09/14/11 1423  BP: 190/97 191/99  Pulse: 55 54  Temp: 98.7 F (37.1 C)   TempSrc: Oral   Height: 5\' 11"  (1.803 m)   Weight: 257 lb 12.8 oz (116.937 kg)   SpO2: 99%    General: NAD, well nourished HEENT: PERRL, EOMI, no scleral icterus, senile nuclear sclerosis bilaterally Cardiac: RRR, no rubs, murmurs or gallops Pulm: clear to auscultation bilaterally, moving normal volumes of air Abd: soft, nontender, nondistended, BS present Ext: warm and well perfused, no pedal edema Neuro: alert and oriented X3, cranial nerves II-XII grossly intact  Assessment & Plan:   Patient was seen and discussed with Dr. Cathren Laine. Patient will followup after surgery for postop evaluation and blood pressure management  INTERNAL MEDICINE TEACHING ATTENDING ADDENDUM - Janell Quiet, MD: I personally saw and evaluated [name] [name] in this clinic visit in conjunction with the resident, Dr. Algis Liming. I have discussed the patient's plan of care with Dr. Algis Liming during this visit. I have confirmed the physical exam findings and have read and agree with the clinic note including the plan.

## 2011-09-15 ENCOUNTER — Encounter (HOSPITAL_BASED_OUTPATIENT_CLINIC_OR_DEPARTMENT_OTHER): Payer: Self-pay | Admitting: *Deleted

## 2011-09-15 NOTE — Progress Notes (Signed)
Pt has been in hospital several time-goes to [facility]-hx cocaine-states not that in 20 yr-alcohol-10 days-working on that. To come for bmet-sister to bring him dos and stay post op with him

## 2011-09-16 ENCOUNTER — Encounter (HOSPITAL_BASED_OUTPATIENT_CLINIC_OR_DEPARTMENT_OTHER)
Admission: RE | Admit: 2011-09-16 | Discharge: 2011-09-16 | Disposition: A | Discharge status: Home / Self Care | Payer: PRIVATE HEALTH INSURANCE | Source: Ambulatory Visit | Attending: Orthopedic Surgery | Admitting: Orthopedic Surgery

## 2011-09-16 LAB — BASIC METABOLIC PANEL
CO2: 27 mEq/L (ref 19–32)
Chloride: 106 mEq/L (ref 96–112)
Glucose, Bld: 102 mg/dL — ABNORMAL HIGH (ref 70–99)
Sodium: 141 mEq/L (ref 135–145)

## 2011-09-17 ENCOUNTER — Ambulatory Visit: Payer: PRIVATE HEALTH INSURANCE | Attending: Orthopedic Surgery | Admitting: Physical Therapy

## 2011-09-17 ENCOUNTER — Encounter (HOSPITAL_BASED_OUTPATIENT_CLINIC_OR_DEPARTMENT_OTHER): Payer: Self-pay | Admitting: Anesthesiology

## 2011-09-17 ENCOUNTER — Ambulatory Visit (HOSPITAL_BASED_OUTPATIENT_CLINIC_OR_DEPARTMENT_OTHER)
Admission: RE | Admit: 2011-09-17 | Discharge: 2011-09-17 | Disposition: A | Discharge status: Home / Self Care | Payer: PRIVATE HEALTH INSURANCE | Source: Ambulatory Visit | Attending: Orthopedic Surgery | Admitting: Orthopedic Surgery

## 2011-09-17 ENCOUNTER — Ambulatory Visit (HOSPITAL_BASED_OUTPATIENT_CLINIC_OR_DEPARTMENT_OTHER): Payer: PRIVATE HEALTH INSURANCE | Admitting: Anesthesiology

## 2011-09-17 ENCOUNTER — Encounter (HOSPITAL_BASED_OUTPATIENT_CLINIC_OR_DEPARTMENT_OTHER)
Admission: RE | Disposition: A | Discharge status: Home / Self Care | Payer: Self-pay | Source: Ambulatory Visit | Attending: Orthopedic Surgery

## 2011-09-17 DIAGNOSIS — M25819 Other specified joint disorders, unspecified shoulder: Secondary | ICD-10-CM | POA: Insufficient documentation

## 2011-09-17 DIAGNOSIS — M19019 Primary osteoarthritis, unspecified shoulder: Secondary | ICD-10-CM | POA: Insufficient documentation

## 2011-09-17 DIAGNOSIS — M719 Bursopathy, unspecified: Secondary | ICD-10-CM | POA: Insufficient documentation

## 2011-09-17 DIAGNOSIS — B1920 Unspecified viral hepatitis C without hepatic coma: Secondary | ICD-10-CM | POA: Insufficient documentation

## 2011-09-17 DIAGNOSIS — Z9889 Other specified postprocedural states: Secondary | ICD-10-CM

## 2011-09-17 DIAGNOSIS — M67919 Unspecified disorder of synovium and tendon, unspecified shoulder: Secondary | ICD-10-CM | POA: Insufficient documentation

## 2011-09-17 DIAGNOSIS — F209 Schizophrenia, unspecified: Secondary | ICD-10-CM | POA: Insufficient documentation

## 2011-09-17 DIAGNOSIS — R51 Headache: Secondary | ICD-10-CM | POA: Insufficient documentation

## 2011-09-17 DIAGNOSIS — M9420 Chondromalacia, unspecified site: Secondary | ICD-10-CM | POA: Insufficient documentation

## 2011-09-17 DIAGNOSIS — I10 Essential (primary) hypertension: Secondary | ICD-10-CM | POA: Insufficient documentation

## 2011-09-17 DIAGNOSIS — K219 Gastro-esophageal reflux disease without esophagitis: Secondary | ICD-10-CM | POA: Insufficient documentation

## 2011-09-17 HISTORY — PX: RESECTION DISTAL CLAVICAL: SHX5053

## 2011-09-17 SURGERY — SHOULDER ARTHROSCOPY WITH SUBACROMIAL DECOMPRESSION
Anesthesia: Choice | Site: Shoulder | Laterality: Right

## 2011-09-17 MED ORDER — ALBUTEROL SULFATE HFA 108 (90 BASE) MCG/ACT IN AERS
INHALATION_SPRAY | RESPIRATORY_TRACT | Status: DC | PRN
  Administered 2011-09-17: 2 via RESPIRATORY_TRACT

## 2011-09-17 MED ORDER — BUPIVACAINE-EPINEPHRINE PF 0.5-1:200000 % IJ SOLN
INTRAMUSCULAR | Status: DC | PRN
  Administered 2011-09-17: 150 mg

## 2011-09-17 MED ORDER — ONDANSETRON HCL 4 MG/2ML IJ SOLN
INTRAMUSCULAR | Status: DC | PRN
  Administered 2011-09-17: 4 mg via INTRAVENOUS

## 2011-09-17 MED ORDER — OXYCODONE HCL 5 MG/5ML PO SOLN
5.0000 mg | Freq: Once | ORAL | Status: AC | PRN
Start: 2011-09-17 — End: 2011-09-17

## 2011-09-17 MED ORDER — MIDAZOLAM HCL 5 MG/5ML IJ SOLN
INTRAMUSCULAR | Status: DC | PRN
  Administered 2011-09-17: 1 mg via INTRAVENOUS

## 2011-09-17 MED ORDER — OXYCODONE HCL 5 MG PO TABS
5.0000 mg | ORAL_TABLET | Freq: Once | ORAL | Status: AC | PRN
  Administered 2011-09-17: 5 mg via ORAL

## 2011-09-17 MED ORDER — OXYCODONE HCL 5 MG/5ML PO SOLN
5.0000 mg | Freq: Once | ORAL | Status: DC | PRN
Start: 2011-09-17 — End: 2011-09-17

## 2011-09-17 MED ORDER — PROPOFOL 10 MG/ML IV EMUL
INTRAVENOUS | Status: DC | PRN
  Administered 2011-09-17: 200 mg via INTRAVENOUS

## 2011-09-17 MED ORDER — DROPERIDOL 2.5 MG/ML IJ SOLN: 0.6250 mg | INTRAMUSCULAR | Status: DC | PRN

## 2011-09-17 MED ORDER — DEXTROSE 5 % IV SOLN
3.0000 g | INTRAVENOUS | Status: AC
  Administered 2011-09-17: 3 g via INTRAVENOUS

## 2011-09-17 MED ORDER — OXYCODONE-ACETAMINOPHEN 5-325 MG PO TABS: 1.0000 | ORAL_TABLET | ORAL | Status: AC | PRN

## 2011-09-17 MED ORDER — FENTANYL CITRATE 0.05 MG/ML IJ SOLN
50.0000 ug | INTRAMUSCULAR | Status: DC | PRN
  Administered 2011-09-17: 100 ug via INTRAVENOUS

## 2011-09-17 MED ORDER — POVIDONE-IODINE 7.5 % EX SOLN: Freq: Once | CUTANEOUS | Status: DC

## 2011-09-17 MED ORDER — SUCCINYLCHOLINE CHLORIDE 20 MG/ML IJ SOLN
INTRAMUSCULAR | Status: DC | PRN
  Administered 2011-09-17: 100 mg via INTRAVENOUS

## 2011-09-17 MED ORDER — SODIUM CHLORIDE 0.9 % IR SOLN
Status: DC | PRN
  Administered 2011-09-17: 6000 mL

## 2011-09-17 MED ORDER — HYDROMORPHONE HCL PF 1 MG/ML IJ SOLN: 0.2500 mg | INTRAMUSCULAR | Status: DC | PRN

## 2011-09-17 MED ORDER — MIDAZOLAM HCL 2 MG/2ML IJ SOLN: 1.0000 mg | INTRAMUSCULAR | Status: DC | PRN

## 2011-09-17 MED ORDER — ACETAMINOPHEN 10 MG/ML IV SOLN
INTRAVENOUS | Status: DC | PRN
  Administered 2011-09-17: 1000 mg via INTRAVENOUS

## 2011-09-17 MED ORDER — MIDAZOLAM HCL 2 MG/2ML IJ SOLN
1.0000 mg | INTRAMUSCULAR | Status: DC | PRN
  Administered 2011-09-17: 2 mg via INTRAVENOUS
  Administered 2011-09-17: 1 mg via INTRAVENOUS

## 2011-09-17 MED ORDER — OXYCODONE HCL 5 MG PO TABS: 5.0000 mg | ORAL_TABLET | Freq: Once | ORAL | Status: DC | PRN

## 2011-09-17 MED ORDER — HYDROMORPHONE HCL PF 1 MG/ML IJ SOLN
0.2500 mg | INTRAMUSCULAR | Status: DC | PRN
  Administered 2011-09-17 (×2): 0.5 mg via INTRAVENOUS

## 2011-09-17 MED ORDER — DEXAMETHASONE SODIUM PHOSPHATE 4 MG/ML IJ SOLN
INTRAMUSCULAR | Status: DC | PRN
  Administered 2011-09-17: 5 mg via INTRAVENOUS

## 2011-09-17 MED ORDER — FENTANYL CITRATE 0.05 MG/ML IJ SOLN: 50.0000 ug | INTRAMUSCULAR | Status: DC | PRN

## 2011-09-17 MED ORDER — LACTATED RINGERS IV SOLN
INTRAVENOUS | Status: DC
  Administered 2011-09-17 (×2): via INTRAVENOUS

## 2011-09-17 SURGICAL SUPPLY — 86 items
ADH SKN CLS APL DERMABOND .7 (GAUZE/BANDAGES/DRESSINGS)
APL SKNCLS STERI-STRIP NONHPOA (GAUZE/BANDAGES/DRESSINGS) ×4
BENZOIN TINCTURE PRP APPL 2/3 (GAUZE/BANDAGES/DRESSINGS) ×2 IMPLANT
BLADE 4.2CUDA (BLADE) IMPLANT
BLADE CUDA 5.5 (BLADE) IMPLANT
BLADE CUTTER GATOR 3.5 (BLADE) IMPLANT
BLADE GREAT WHITE 4.2 (BLADE) IMPLANT
BLADE SURG 15 STRL LF DISP TIS (BLADE) IMPLANT
BLADE SURG 15 STRL SS (BLADE) ×3
BLADE SURG ROTATE 9660 (MISCELLANEOUS) ×1 IMPLANT
BLADE VORTEX 6.0 (BLADE) IMPLANT
BUR OVAL 4.0 (BURR) ×3 IMPLANT
BUR OVAL 6.0 (BURR) IMPLANT
CANISTER OMNI JUG 16 LITER (MISCELLANEOUS) ×3 IMPLANT
CANISTER SUCTION 2500CC (MISCELLANEOUS) IMPLANT
CANNULA 5.75X71 LONG (CANNULA) ×3 IMPLANT
CANNULA TWIST IN 8.25X7CM (CANNULA) IMPLANT
CHLORAPREP W/TINT 26ML (MISCELLANEOUS) ×3 IMPLANT
CLOTH BEACON ORANGE TIMEOUT ST (SAFETY) ×3 IMPLANT
DECANTER SPIKE VIAL GLASS SM (MISCELLANEOUS) IMPLANT
DERMABOND ADVANCED (GAUZE/BANDAGES/DRESSINGS)
DERMABOND ADVANCED .7 DNX12 (GAUZE/BANDAGES/DRESSINGS) IMPLANT
DRAPE INCISE IOBAN 66X45 STRL (DRAPES) ×3 IMPLANT
DRAPE STERI 35X30 U-POUCH (DRAPES) ×3 IMPLANT
DRAPE SURG 17X23 STRL (DRAPES) ×3 IMPLANT
DRAPE U 20/CS (DRAPES) ×3 IMPLANT
DRAPE U-SHAPE 47X51 STRL (DRAPES) ×3 IMPLANT
DRAPE U-SHAPE 76X120 STRL (DRAPES) ×6 IMPLANT
DRSG PAD ABDOMINAL 8X10 ST (GAUZE/BANDAGES/DRESSINGS) ×3 IMPLANT
ELECT REM PT RETURN 9FT ADLT (ELECTROSURGICAL) ×3
ELECTRODE REM PT RTRN 9FT ADLT (ELECTROSURGICAL) ×2 IMPLANT
GAUZE SPONGE 4X4 16PLY XRAY LF (GAUZE/BANDAGES/DRESSINGS) IMPLANT
GAUZE XEROFORM 1X8 LF (GAUZE/BANDAGES/DRESSINGS) ×3 IMPLANT
GLOVE BIO SURGEON STRL SZ7.5 (GLOVE) ×5 IMPLANT
GLOVE BIOGEL M STRL SZ7.5 (GLOVE) ×1 IMPLANT
GLOVE BIOGEL PI IND STRL 8 (GLOVE) ×4 IMPLANT
GLOVE BIOGEL PI INDICATOR 8 (GLOVE) ×1
GOWN PREVENTION PLUS XLARGE (GOWN DISPOSABLE) ×3 IMPLANT
NDL 1/2 CIR CATGUT .05X1.09 (NEEDLE) IMPLANT
NDL SCORPION MULTI FIRE (NEEDLE) IMPLANT
NDL SUT 6 .5 CRC .975X.05 MAYO (NEEDLE) IMPLANT
NEEDLE 1/2 CIR CATGUT .05X1.09 (NEEDLE) IMPLANT
NEEDLE MAYO TAPER (NEEDLE)
NEEDLE SCORPION MULTI FIRE (NEEDLE) IMPLANT
NS IRRIG 1000ML POUR BTL (IV SOLUTION) IMPLANT
PACK ARTHROSCOPY DSU (CUSTOM PROCEDURE TRAY) ×3 IMPLANT
PACK BASIN DAY SURGERY FS (CUSTOM PROCEDURE TRAY) ×3 IMPLANT
PENCIL BUTTON HOLSTER BLD 10FT (ELECTRODE) ×1 IMPLANT
RESECTOR FULL RADIUS 4.2MM (BLADE) ×3 IMPLANT
RESECTOR FULL RADIUS 4.8MM (BLADE) IMPLANT
SHEET MEDIUM DRAPE 40X70 STRL (DRAPES) IMPLANT
SLEEVE SCD COMPRESS KNEE MED (MISCELLANEOUS) ×3 IMPLANT
SLING ARM FOAM STRAP LRG (SOFTGOODS) IMPLANT
SLING ARM FOAM STRAP MED (SOFTGOODS) IMPLANT
SLING ARM FOAM STRAP XLG (SOFTGOODS) IMPLANT
SLING ARM IMMOBILIZER MED (SOFTGOODS) IMPLANT
SPONGE GAUZE 4X4 12PLY (GAUZE/BANDAGES/DRESSINGS) ×3 IMPLANT
SPONGE LAP 4X18 X RAY DECT (DISPOSABLE) ×1 IMPLANT
STRIP CLOSURE SKIN 1/2X4 (GAUZE/BANDAGES/DRESSINGS) ×2 IMPLANT
SUCTION FRAZIER TIP 10 FR DISP (SUCTIONS) IMPLANT
SUPPORT WRAP ARM LG (MISCELLANEOUS) IMPLANT
SUT BONE WAX W31G (SUTURE) IMPLANT
SUT ETHIBOND 2 OS 4 DA (SUTURE) IMPLANT
SUT ETHILON 3 0 PS 1 (SUTURE) ×3 IMPLANT
SUT ETHILON 4 0 PS 2 18 (SUTURE) IMPLANT
SUT FIBERWIRE #2 38 T-5 BLUE (SUTURE)
SUT MNCRL AB 3-0 PS2 18 (SUTURE) IMPLANT
SUT MNCRL AB 4-0 PS2 18 (SUTURE) ×1 IMPLANT
SUT PDS AB 0 CT 36 (SUTURE) IMPLANT
SUT PROLENE 3 0 PS 2 (SUTURE) IMPLANT
SUT VIC AB 0 CT1 18XCR BRD 8 (SUTURE) IMPLANT
SUT VIC AB 0 CT1 27 (SUTURE) ×3
SUT VIC AB 0 CT1 27XBRD ANBCTR (SUTURE) IMPLANT
SUT VIC AB 0 CT1 8-18 (SUTURE)
SUT VIC AB 2-0 SH 18 (SUTURE) IMPLANT
SUT VIC AB 2-0 SH 27 (SUTURE) ×3
SUT VIC AB 2-0 SH 27XBRD (SUTURE) IMPLANT
SUTURE FIBERWR #2 38 T-5 BLUE (SUTURE) IMPLANT
SYR BULB 3OZ (MISCELLANEOUS) IMPLANT
TOWEL OR 17X24 6PK STRL BLUE (TOWEL DISPOSABLE) ×3 IMPLANT
TOWEL OR NON WOVEN STRL DISP B (DISPOSABLE) ×3 IMPLANT
TUBE CONNECTING 20X1/4 (TUBING) ×3 IMPLANT
TUBING ARTHROSCOPY IRRIG 16FT (MISCELLANEOUS) ×3 IMPLANT
WAND STAR VAC 90 (SURGICAL WAND) ×3 IMPLANT
WATER STERILE IRR 1000ML POUR (IV SOLUTION) ×3 IMPLANT
YANKAUER SUCT BULB TIP NO VENT (SUCTIONS) ×1 IMPLANT

## 2011-09-17 NOTE — Anesthesia Procedure Notes (Addendum)
Anesthesia Regional Block:  Interscalene brachial plexus block  Pre-Anesthetic Checklist: ,, timeout performed, Correct Patient, Correct Site, Correct Laterality, Correct Procedure,, site marked, risks and benefits discussed, Surgical consent,  Pre-op evaluation,  At surgeon's request and post-op pain management  Laterality: Right  Prep: chloraprep       Needles:  Injection technique: Single-shot  Needle Type: Echogenic Stimulator Needle     Needle Length: 5cm 5 cm Needle Gauge: 22 and 22 G    Additional Needles:  Procedures: ultrasound guided and nerve stimulator Interscalene brachial plexus block  Nerve Stimulator or Paresthesia:  Response: bicep contraction, 0.45 mA,   Additional Responses:   Narrative:  Start time: 09/17/2011 9:29 AM End time: 09/17/2011 9:40 AM Injection made incrementally with aspirations every 5 mL.  Performed by: Personally  Anesthesiologist: J. Tamela Gammon, MD  Additional Notes: Functioning IV was confirmed and monitors applied.  A 39mm 22ga echogenic arrow stimulator was used. Sterile prep and drape,hand hygiene and sterile gloves were used.Ultrasound guidance: relevant anatomy identified, needle position confirmed, local anesthetic spread visualized around nerve(s)., vascular puncture avoided.  Image printed for medical record.  Negative aspiration and negative test dose prior to incremental administration of local anesthetic. The patient tolerated the procedure well.  Interscalene brachial plexus block Procedure Name: Intubation Date/Time: 09/17/2011 9:51 AM Performed by: Melynda Ripple D Pre-anesthesia Checklist: Patient identified, Emergency Drugs available, Suction available, Patient being monitored and Timeout performed Patient Re-evaluated:Patient Re-evaluated prior to inductionOxygen Delivery Method: Circle System Utilized Preoxygenation: Pre-oxygenation with 100% oxygen Intubation Type: IV induction Ventilation: Mask ventilation without  difficulty Laryngoscope Size: Mac and 3 Grade View: Grade II Tube type: Oral Tube size: 8.0 mm Number of attempts: 1 Airway Equipment and Method: stylet and oral airway Placement Confirmation: ETT inserted through vocal cords under direct vision,  positive ETCO2 and breath sounds checked- equal and bilateral Secured at: 24 cm Tube secured with: Tape Dental Injury: Teeth and Oropharynx as per pre-operative assessment

## 2011-09-17 NOTE — H&P (Signed)
[name] [name] is an 56 y.o. male.   Chief Complaint: R shoulder pain HPI: 2 year history of right shoulder pain related to impingement, AC joint DJD, failed conservative treatment with injections, rest, etc.  Past Medical History  Diagnosis Date  . Hypertension   . H/O ETOH abuse   . Colitis   . GERD (gastroesophageal reflux disease)   . Hepatitis     HEP  C  . Cocaine abuse     HISTORY OF  . 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 Diverticulitis     H/O  . Headache   . Arthritis   . Schizophrenia, schizo-affective   . Schizo affective schizophrenia     TAKES ABILIFY  IF  NEEDED  . Cataract   . Hyperlipidemia   . Wears glasses   . Wears dentures     upper    Past Surgical History  Procedure Date  . Back surgery     8 MTHS AGO  . Left achilles   . Tonsillectomy   . Knee arthroscopy   . I&d extremity 03/20/2011    Procedure: IRRIGATION AND DEBRIDEMENT EXTREMITY;  Surgeon: Kerin Salen, MD;  Location: [place];  Service: Orthopedics;  Laterality: Left;  I&D LEFT ACHILLIES TENDON  . Eye surgery     right eye    Family History  Problem Relation Age of Onset  . Heart disease Mother   . Heart disease Father   . Heart disease Sister    Social History:  reports that he quit smoking about 4 years ago. His smoking use included Cigarettes. He has a 30 pack-year smoking history. He does not have any smokeless tobacco history on file. He reports that he drinks alcohol. He reports that he does not use illicit drugs.  Allergies:  Allergies  Allergen Reactions  . Thorazine (Chlorpromazine Hcl) Other (See Comments)    Body freezes up    Medications Prior to Admission  Medication Sig Dispense Refill  . cloNIDine (CATAPRES) 0.3 MG tablet Take 0.3 mg by mouth daily.       . fenofibrate (TRICOR) 48 MG tablet Take 48 mg by mouth daily.      . furosemide (LASIX) 20 MG tablet 20 mg.      . HYDROcodone-acetaminophen (NORCO/VICODIN) 5-325 MG per tablet Take 1  tablet by mouth every 6 (six) hours as needed.      . labetalol (NORMODYNE) 100 MG tablet Take 100 mg by mouth 2 (two) times daily.      Marland Kitchen lisinopril (PRINIVIL,ZESTRIL) 20 MG tablet Take 20 mg by mouth daily.      . traZODone (DESYREL) 100 MG tablet Take 1 tablet (100 mg total) by mouth at bedtime.  30 tablet  3  . oxyCODONE-acetaminophen (PERCOCET/ROXICET) 5-325 MG per tablet         Results for orders placed during the hospital encounter of 09/17/11 (from the past 48 hour(s))  BASIC METABOLIC PANEL     Status: Abnormal   Collection Time   09/16/11 12:00 PM      Component Value Range Comment   Sodium 141  135 - 145 mEq/L    Potassium 4.6  3.5 - 5.1 mEq/L    Chloride 106  96 - 112 mEq/L    CO2 27  19 - 32 mEq/L    Glucose, Bld 102 (*) 70 - 99 mg/dL    BUN 12  6 - 23 mg/dL    Creatinine, Ser 0.99  0.50 - 1.35 mg/dL    Calcium 9.8  8.4 - 10.5 mg/dL    GFR calc non Af Amer 89 (*) >90 mL/min    GFR calc Af Amer >90  >90 mL/min    No results found.  Review of Systems  All other systems reviewed and are negative.    Blood pressure 170/102, pulse 53, temperature 98.2 F (36.8 C), temperature source Oral, resp. rate 18, height 5\' 11"  (1.803 m), weight 116.121 kg (256 lb), SpO2 98.00%. Physical Exam  Constitutional: He is oriented to person, place, and time. He appears well-developed and well-nourished.  HENT:  Head: Atraumatic.  Eyes: EOM are normal.  Cardiovascular: Intact distal pulses.   Respiratory: Effort normal.  Musculoskeletal:       Right shoulder: He exhibits bony tenderness and pain.  Neurological: He is alert and oriented to person, place, and time.  Skin: Skin is warm and dry.  Psychiatric: He has a normal mood and affect.     Assessment/Plan R shoulder impingement/ ac joint DJD Plan arthroscopic SAD, open DCE Risks / benefits of surgery discussed Consent on chart  NPO for OR Preop antibiotics   Aiman Sonn WILLIAM 09/17/2011, 9:02 AM

## 2011-09-17 NOTE — Transfer of Care (Signed)
Immediate Anesthesia Transfer of Care Note  Patient: [name] [name]  Procedure(s) Performed: Procedure(s) (LRB): SHOULDER ARTHROSCOPY WITH SUBACROMIAL DECOMPRESSION (Right) RESECTION DISTAL CLAVICAL (Right)  Patient Location: PACU  Anesthesia Type: General and Regional  Level of Consciousness: awake, alert  and oriented  Airway & Oxygen Therapy: Patient Spontanous Breathing and Patient connected to nasal cannula oxygen  Post-op Assessment: Report given to PACU RN and Post -op Vital signs reviewed and stable  Post vital signs: Reviewed and stable  Complications: No apparent anesthesia complications

## 2011-09-17 NOTE — Op Note (Signed)
Procedure(s): SHOULDER ARTHROSCOPY WITH SUBACROMIAL DECOMPRESSION RESECTION DISTAL CLAVICAL Procedure Note  [name] [name] male 56 y.o. 09/17/2011  Procedure(s) and Anesthesia Type:  #1 right SHOULDER ARTHROSCOPY WITH SUBACROMIAL DECOMPRESSION - Choice #2 right shoulder arthroscopic extensive debridement of partial-thickness articular sided rotator cuff tear and biceps tenotomy through separate anterior intra-articular portal #3 right shoulder open distal clavicle excision  Surgeon(s) and Role:    * Nita Sells, MD - Primary     Surgeon: Nita Sells   Assistants: Jeanmarie Hubert PA-C [facility] was present and scrubbed throughout the procedure and assisted in positioning, retraction and use of the camera during the procedure)  Anesthesia: General endotracheal anesthesia with preoperative interscalene block     Procedure Detail  Estimated Blood Loss: Min         Drains: none  Blood Given: none         Specimens: none        Complications:  * No complications entered in OR log *         Disposition: PACU - hemodynamically stable.         Condition: stable    Procedure:   INDICATIONS FOR SURGERY: The patient is 56 y.o. male who has had greater than 2 year history of right shoulder pain and has had multiple injections in both the subacromial space and a.c. joint with temporary relief of his pain. He was indicated for surgical treatment to resect the distal clavicle, debris the rotator cuff and correct the anterior acromial anatomy.  OPERATIVE FINDINGS: Examination under anesthesia: No stiffness or instability Diagnostic Arthroscopy:  Glenoid articular cartilage: Grade 2 and 3 chondromalacia Humeral head articular cartilage: Some diffuse grade 2 and 3 changes Labrum: Superior labral tear, debrided. Extensive tearing of the long head biceps tendon involving greater than 50%. An arthroscopic tenotomy was performed. Loose bodies:  None Synovitis: Moderate Articular sided rotator cuff: Significant partial tearing and fraying which was debrided back to stable healthy appearing tendon Bursal sided rotator cuff: Significant hypertrophied and inflamed bursa . This was excised. The bursal surface of the rotator cuff was intact. Coracoacromial ligament: Frayed, large anterior hook acromial spur  DESCRIPTION OF PROCEDURE: The patient was identified in preoperative  holding area where I personally marked the operative site after  verifying site, side, and procedure with the patient. An interscalene block was given by the attending anesthesiologist the holding area.  The patient was taken back to the operating room where general anesthesia was induced without complication and was placed in the beach-chair position with the back  elevated about 60 degrees and all extremities and head and neck carefully padded and  positioned.   The right upper extremity was then prepped and  draped in a standard sterile fashion. The appropriate time-out  procedure was carried out. The patient did receive IV antibiotics  within 30 minutes of incision.   A small posterior portal incision was made and the arthroscope was introduced into the joint. An anterior portal was then established above the subscapularis using needle localization. Small cannula was placed anteriorly. Diagnostic arthroscopy was then carried out with findings as described above.  He was noted to have extensive tearing involving the superior labrum and long head biceps. Greater than 50% of the thickness of the long head biceps tendon was involved. It was felt that a biceps tenotomy and debridement of the labral tear would be appropriate. Therefore a large biter was introduced through the anterior intra-articular cannula and used to release the long  head biceps off the superior labrum. The superior labrum was then debrided back to stable tissue. The remaining labrum was firmly  attached to the glenoid. The glenoid articular cartilage was examined and found to have some thinning and small areas of grade 3 changes as well as the humeral head. No flaps were noted that needed debridement. He did have extensive partial thickness tearing on the articular surface of the supraspinatus which was debrided. The remaining supraspinatus attached to the footprint appeared healthy and did not appear to involve more than 50% of the tendon.  The arthroscope was then introduced into the subacromial space a standard lateral portal was established with needle localization. The shaver was used through the lateral portal to perform extensive bursectomy. Coracoacromial ligament was examined and found to be significantly frayed. She was noted to have extensive hypertrophied and inflamed bursitis that was very hyperemic. This covered the entire bursal surface of the rotator cuff. I performed extensive bursectomy using a shaver through a lateral portal which was created under direct visualization. Once the bursectomy was carried out the bursal surface of the rotator cuff was carefully examined and found to be completely intact. The undersurface of the acromion was then exposed using a combination the shaver and ArthriCare and the coracoacromial ligament was taken off the anterior acromion exposing a very large hooked anterior acromial spur. A high-speed bur was then used through the lateral portal to take down the anterior acromial spur from lateral to medial in a standard acromioplasty.  The acromioplasty was also viewed from the lateral portal and the bur was used as necessary to ensure that the acromion was completely flat from posterior to anterior.  The arthroscopic equipment was removed from the joint and attention was turned to the distal clavicle where A. approximately 4 cm incision was made in line with the skin lines just medial to the palpable a.c. joint. Dissection was carried down through  subcutaneous tissues to the level of the deltotrapezial fascia which was split longitudinally over the a.c. joint. This was elevated anterior posterior exposing anterior posterior extent of the distal clavicle. Retractors were placed anterior and posterior and a small oscillating saw was used to resect approximately 10 mm off of the distal clavicle and a smooth even fashion. Digital palpation was used to ensure no continued impingement and a.c. joint. This was then copiously irrigated with normal saline and the dorsal a.c. capsule was closed using 0 Vicryl.  The skin was then closed with 2-0 Vicryl in a deep dermal layer and 4-0 Monocryl for skin closure in a running subcuticular fashion. Steri-Strips were applied. The portals were closed with 3-0 nylon in an interrupted fashion. Sterile dressings were then applied including Xeroform 4 x 4's ABDs and tape. The patient was then allowed to awaken from general anesthesia, placed in a sling, transferred to the stretcher and taken to the recovery room in stable condition.   POSTOPERATIVE PLAN: The patient will be discharged home today and will followup in one week for suture removal and wound check.

## 2011-09-17 NOTE — Progress Notes (Signed)
Assisted Dr. Singer with right, ultrasound guided, interscalene  block. Side rails up, monitors on throughout procedure. See vital signs in flow sheet. Tolerated Procedure well. 

## 2011-09-17 NOTE — Anesthesia Postprocedure Evaluation (Signed)
Anesthesia Post Note  Patient: [name] [name]  Procedure(s) Performed: Procedure(s) (LRB): SHOULDER ARTHROSCOPY WITH SUBACROMIAL DECOMPRESSION (Right) RESECTION DISTAL CLAVICAL (Right)  Anesthesia type: general  Patient location: PACU  Post pain: Pain level controlled  Post assessment: Patient's Cardiovascular Status Stable  Last Vitals:  Filed Vitals:   09/17/11 1200  BP: 151/84  Pulse:   Temp:   Resp:     Post vital signs: Reviewed and stable  Level of consciousness: sedated  Complications: No apparent anesthesia complications

## 2011-09-17 NOTE — Anesthesia Preprocedure Evaluation (Signed)
Anesthesia Evaluation  Patient identified by MRN, date of birth, ID band Patient awake    Reviewed: Allergy & Precautions, H&P , NPO status , Patient's Chart, lab work & pertinent test results, reviewed documented beta blocker date and time   History of Anesthesia Complications Negative for: history of anesthetic complications  Airway Mallampati: I TM Distance: >3 FB Neck ROM: Full    Dental  (+) Dental Advisory Given and Poor Dentition   Pulmonary former smoker,  breath sounds clear to auscultation  Pulmonary exam normal       Cardiovascular hypertension, Rhythm:Regular Rate:Normal     Neuro/Psych  Headaches, PSYCHIATRIC DISORDERS Schizophrenia    GI/Hepatic GERD-  Medicated and Controlled,(+) Hepatitis -, C  Endo/Other  negative endocrine ROS  Renal/GU      Musculoskeletal   Abdominal   Peds  Hematology   Anesthesia Other Findings   Reproductive/Obstetrics                           Anesthesia Physical Anesthesia Plan  ASA: III  Anesthesia Plan: General   Post-op Pain Management:    Induction: Intravenous  Airway Management Planned: Oral ETT  Additional Equipment:   Intra-op Plan:   Post-operative Plan: Extubation in OR  Informed Consent: I have reviewed the patients History and Physical, chart, labs and discussed the procedure including the risks, benefits and alternatives for the proposed anesthesia with the patient or authorized representative who has indicated his/her understanding and acceptance.   Dental advisory given  Plan Discussed with: CRNA, Anesthesiologist and Surgeon  Anesthesia Plan Comments:         Anesthesia Quick Evaluation

## 2011-09-18 LAB — POCT HEMOGLOBIN-HEMACUE: Hemoglobin: 12.3 g/dL — ABNORMAL LOW (ref 13.0–17.0)

## 2011-09-22 ENCOUNTER — Encounter (HOSPITAL_BASED_OUTPATIENT_CLINIC_OR_DEPARTMENT_OTHER): Payer: Self-pay | Admitting: Orthopedic Surgery

## 2011-09-23 ENCOUNTER — Encounter (HOSPITAL_BASED_OUTPATIENT_CLINIC_OR_DEPARTMENT_OTHER): Payer: Self-pay

## 2011-10-08 ENCOUNTER — Ambulatory Visit: Payer: PRIVATE HEALTH INSURANCE | Attending: Orthopedic Surgery | Admitting: Physical Therapy

## 2011-10-08 DIAGNOSIS — M545 Low back pain, unspecified: Secondary | ICD-10-CM | POA: Insufficient documentation

## 2011-10-08 DIAGNOSIS — R293 Abnormal posture: Secondary | ICD-10-CM | POA: Insufficient documentation

## 2011-10-08 DIAGNOSIS — M2560 Stiffness of unspecified joint, not elsewhere classified: Secondary | ICD-10-CM | POA: Insufficient documentation

## 2011-10-08 DIAGNOSIS — IMO0001 Reserved for inherently not codable concepts without codable children: Secondary | ICD-10-CM | POA: Insufficient documentation

## 2011-10-13 ENCOUNTER — Ambulatory Visit: Payer: PRIVATE HEALTH INSURANCE | Admitting: Rehabilitation

## 2011-10-14 ENCOUNTER — Encounter: Payer: PRIVATE HEALTH INSURANCE | Admitting: Physical Therapy

## 2011-10-15 ENCOUNTER — Ambulatory Visit: Payer: PRIVATE HEALTH INSURANCE | Admitting: Physical Therapy

## 2011-10-20 ENCOUNTER — Ambulatory Visit: Payer: PRIVATE HEALTH INSURANCE | Admitting: Physical Therapy

## 2011-10-22 ENCOUNTER — Ambulatory Visit: Payer: PRIVATE HEALTH INSURANCE | Admitting: Rehabilitation

## 2011-10-27 ENCOUNTER — Ambulatory Visit: Payer: PRIVATE HEALTH INSURANCE | Admitting: Rehabilitation

## 2011-10-29 ENCOUNTER — Encounter: Payer: PRIVATE HEALTH INSURANCE | Admitting: Internal Medicine

## 2011-10-29 ENCOUNTER — Encounter: Payer: PRIVATE HEALTH INSURANCE | Admitting: Rehabilitation

## 2011-11-02 ENCOUNTER — Other Ambulatory Visit: Payer: Self-pay | Admitting: *Deleted

## 2011-11-02 MED ORDER — LABETALOL HCL 100 MG PO TABS: 100.0000 mg | ORAL_TABLET | Freq: Two times a day (BID) | ORAL | Status: DC

## 2011-11-02 MED ORDER — CLONIDINE HCL 0.3 MG PO TABS: 0.3000 mg | ORAL_TABLET | Freq: Every day | ORAL | Status: DC

## 2011-11-02 MED ORDER — FUROSEMIDE 20 MG PO TABS: 20.0000 mg | ORAL_TABLET | Freq: Every day | ORAL | Status: DC

## 2011-11-02 MED ORDER — LISINOPRIL 20 MG PO TABS: 20.0000 mg | ORAL_TABLET | Freq: Every day | ORAL | Status: DC

## 2011-11-03 ENCOUNTER — Encounter: Payer: PRIVATE HEALTH INSURANCE | Admitting: Rehabilitation

## 2011-11-12 ENCOUNTER — Encounter: Payer: PRIVATE HEALTH INSURANCE | Admitting: Rehabilitation

## 2012-01-27 DIAGNOSIS — Q2112 Patent foramen ovale: Secondary | ICD-10-CM

## 2012-01-27 DIAGNOSIS — I639 Cerebral infarction, unspecified: Secondary | ICD-10-CM

## 2012-01-27 DIAGNOSIS — Q211 Atrial septal defect: Secondary | ICD-10-CM

## 2012-01-27 HISTORY — DX: Cerebral infarction, unspecified: I63.9

## 2012-01-27 HISTORY — DX: Patent foramen ovale: Q21.12

## 2012-01-27 HISTORY — DX: Atrial septal defect: Q21.1

## 2012-02-08 ENCOUNTER — Encounter (HOSPITAL_COMMUNITY): Payer: Self-pay

## 2012-02-08 ENCOUNTER — Emergency Department (HOSPITAL_COMMUNITY)
Admission: EM | Admit: 2012-02-08 | Discharge: 2012-02-08 | Disposition: A | Discharge status: Home / Self Care | Payer: PRIVATE HEALTH INSURANCE | Attending: Emergency Medicine | Admitting: Emergency Medicine

## 2012-02-08 DIAGNOSIS — R112 Nausea with vomiting, unspecified: Secondary | ICD-10-CM | POA: Insufficient documentation

## 2012-02-08 DIAGNOSIS — R197 Diarrhea, unspecified: Secondary | ICD-10-CM | POA: Insufficient documentation

## 2012-02-08 DIAGNOSIS — Z8719 Personal history of other diseases of the digestive system: Secondary | ICD-10-CM | POA: Insufficient documentation

## 2012-02-08 DIAGNOSIS — Z87891 Personal history of nicotine dependence: Secondary | ICD-10-CM | POA: Insufficient documentation

## 2012-02-08 DIAGNOSIS — E669 Obesity, unspecified: Secondary | ICD-10-CM | POA: Insufficient documentation

## 2012-02-08 DIAGNOSIS — Z862 Personal history of diseases of the blood and blood-forming organs and certain disorders involving the immune mechanism: Secondary | ICD-10-CM | POA: Insufficient documentation

## 2012-02-08 DIAGNOSIS — Z8739 Personal history of other diseases of the musculoskeletal system and connective tissue: Secondary | ICD-10-CM | POA: Insufficient documentation

## 2012-02-08 DIAGNOSIS — I10 Essential (primary) hypertension: Secondary | ICD-10-CM

## 2012-02-08 DIAGNOSIS — F259 Schizoaffective disorder, unspecified: Secondary | ICD-10-CM | POA: Insufficient documentation

## 2012-02-08 DIAGNOSIS — Z8659 Personal history of other mental and behavioral disorders: Secondary | ICD-10-CM | POA: Insufficient documentation

## 2012-02-08 DIAGNOSIS — Z79899 Other long term (current) drug therapy: Secondary | ICD-10-CM | POA: Insufficient documentation

## 2012-02-08 DIAGNOSIS — E785 Hyperlipidemia, unspecified: Secondary | ICD-10-CM | POA: Insufficient documentation

## 2012-02-08 DIAGNOSIS — Z8669 Personal history of other diseases of the nervous system and sense organs: Secondary | ICD-10-CM | POA: Insufficient documentation

## 2012-02-08 LAB — URINALYSIS, MICROSCOPIC ONLY
Glucose, UA: NEGATIVE mg/dL
Ketones, ur: 15 mg/dL — AB
Nitrite: POSITIVE — AB
Protein, ur: >300 mg/dL — AB

## 2012-02-08 LAB — COMPREHENSIVE METABOLIC PANEL
BUN: 31 mg/dL — ABNORMAL HIGH (ref 6–23)
CO2: 18 mEq/L — ABNORMAL LOW (ref 19–32)
Chloride: 102 mEq/L (ref 96–112)
Creatinine, Ser: 1.2 mg/dL (ref 0.50–1.35)
GFR calc non Af Amer: 65 mL/min — ABNORMAL LOW (ref >90)
Glucose, Bld: 151 mg/dL — ABNORMAL HIGH (ref 70–99)
Total Bilirubin: 0.8 mg/dL (ref 0.3–1.2)

## 2012-02-08 LAB — CBC WITH DIFFERENTIAL/PLATELET
HCT: 53.6 % — ABNORMAL HIGH (ref 39.0–52.0)
Hemoglobin: 18.2 g/dL — ABNORMAL HIGH (ref 13.0–17.0)
Lymphocytes Relative: 2 % — ABNORMAL LOW (ref 12–46)
Lymphs Abs: 0.2 10*3/uL — ABNORMAL LOW (ref 0.7–4.0)
MCHC: 34 g/dL (ref 30.0–36.0)
Monocytes Absolute: 0.7 10*3/uL (ref 0.1–1.0)
Monocytes Relative: 7 % (ref 3–12)
Neutro Abs: 9.6 10*3/uL — ABNORMAL HIGH (ref 1.7–7.7)
WBC: 10.7 10*3/uL — ABNORMAL HIGH (ref 4.0–10.5)

## 2012-02-08 LAB — LIPASE, BLOOD: Lipase: 30 U/L (ref 11–59)

## 2012-02-08 MED ORDER — CLONIDINE HCL 0.3 MG PO TABS: 0.3000 mg | ORAL_TABLET | Freq: Two times a day (BID) | ORAL | Status: DC

## 2012-02-08 MED ORDER — KETOROLAC TROMETHAMINE 30 MG/ML IJ SOLN
30.0000 mg | Freq: Once | INTRAMUSCULAR | Status: AC
  Administered 2012-02-08: 30 mg via INTRAVENOUS
  Filled 2012-02-08: qty 1

## 2012-02-08 MED ORDER — LISINOPRIL 20 MG PO TABS: 20.0000 mg | ORAL_TABLET | Freq: Every day | ORAL | Status: DC

## 2012-02-08 MED ORDER — SODIUM CHLORIDE 0.9 % IV BOLUS (SEPSIS)
1000.0000 mL | Freq: Once | INTRAVENOUS | Status: AC
  Administered 2012-02-08: 1000 mL via INTRAVENOUS

## 2012-02-08 MED ORDER — ONDANSETRON 4 MG PO TBDP
8.0000 mg | ORAL_TABLET | Freq: Once | ORAL | Status: AC
  Administered 2012-02-08: 8 mg via ORAL
  Filled 2012-02-08: qty 2

## 2012-02-08 MED ORDER — FUROSEMIDE 20 MG PO TABS: 20.0000 mg | ORAL_TABLET | Freq: Every day | ORAL | Status: DC

## 2012-02-08 NOTE — ED Notes (Signed)
Pt. Up walking in the waiting room, rechecked vitals. Pt.s BP is elevated. Pt. Stopped taking his BP meds.  Pt. Continues to have lower back pain.  Pt. Denies any headaches denies any chest pain.

## 2012-02-08 NOTE — ED Notes (Signed)
Pt had hx of back surgery and recently finished therapy but tripped and fell on table 2 weeks ago. Presents for increase in pain to lower back and N/V/D today. Feels like he has been running a fever.

## 2012-02-08 NOTE — ED Notes (Signed)
Patient reports that the emesis episodes started up after eating chicken earlier today.

## 2012-02-08 NOTE — ED Provider Notes (Signed)
History     CSN: [id number]  Arrival date & time 02/08/12  1512   First MD Initiated Contact with Patient 02/08/12 2000      Chief Complaint  Patient presents with  . Back Pain  . Emesis  . Diarrhea  . Nausea    (Consider location/radiation/quality/duration/timing/severity/associated sxs/prior treatment) HPI Comments: Patient states he fell into a table 10 days ago had no pain until yesterday than today developed N/V/D.  He attributes this to chicken that was left out of than cooked--it tasted ok but several hours later started vomiting.Marland Kitchen  He also states he has not taken his BP medication in over a week-  Called in for renewal but they have not responded to his request ( new patient of [facility])   Patient is a 57 y.o. male presenting with back pain, vomiting, and diarrhea. The history is provided by the patient.  Back Pain  This is a new problem. The current episode started more than 1 week ago. The problem occurs daily. The problem has not changed since onset.The pain is associated with falling. Associated symptoms include abdominal pain. Pertinent negatives include no chest pain, no fever, no numbness, no dysuria and no weakness.  Emesis  Associated symptoms include abdominal pain and diarrhea. Pertinent negatives include no chills, no cough and no fever.  Diarrhea The primary symptoms include abdominal pain, nausea, vomiting and diarrhea. Primary symptoms do not include fever, dysuria or rash.  The illness is also significant for back pain. The illness does not include chills.    Past Medical History  Diagnosis Date  . Hypertension   . H/O ETOH abuse   . Colitis   . GERD (gastroesophageal reflux disease)   . Hepatitis     HEP  C  . Cocaine abuse     HISTORY OF  . 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 Diverticulitis     H/O  . Headache   . Arthritis   . Schizophrenia, schizo-affective   . Schizo affective schizophrenia    TAKES ABILIFY  IF  NEEDED  . Cataract   . Hyperlipidemia   . Wears glasses   . Wears dentures     upper    Past Surgical History  Procedure Date  . Back surgery     8 MTHS AGO  . Left achilles   . Tonsillectomy   . Knee arthroscopy   . I&d extremity 03/20/2011    Procedure: IRRIGATION AND DEBRIDEMENT EXTREMITY;  Surgeon: Kerin Salen, MD;  Location: [facility];  Service: Orthopedics;  Laterality: Left;  I&D LEFT ACHILLIES TENDON  . Eye surgery     right eye  . Resection distal clavical 09/17/2011    Procedure: RESECTION DISTAL CLAVICAL;  Surgeon: Nita Sells, MD;  Location: [facility];  Service: Orthopedics;  Laterality: Right;  right shoulder arthroscopy with sad and open distal clavicle excision     Family History  Problem Relation Age of Onset  . Heart disease Mother   . Heart disease Father   . Heart disease Sister     History  Substance Use Topics  . Smoking status: Former Smoker -- 1.0 packs/day for 30 years    Types: Cigarettes    Quit date: 03/03/2007  . Smokeless tobacco: Not on file  . Alcohol Use: Yes     Comment: frequently-- 1/5th a day      Review of Systems  Constitutional: Negative for fever and  chills.  HENT: Negative.   Eyes: Negative.   Respiratory: Negative for cough and shortness of breath.   Cardiovascular: Negative for chest pain.  Gastrointestinal: Positive for nausea, vomiting, abdominal pain and diarrhea.  Genitourinary: Negative for dysuria and flank pain.  Musculoskeletal: Positive for back pain.  Skin: Negative for rash.  Neurological: Negative for dizziness, weakness and numbness.    Allergies  Thorazine  Home Medications   Current Outpatient Rx  Name  Route  Sig  Dispense  Refill  . ARIPIPRAZOLE 10 MG PO TABS   Oral   Take 10 mg by mouth daily.         . FENOFIBRATE 48 MG PO TABS   Oral   Take 48 mg by mouth daily.         Marland Kitchen LABETALOL HCL 100 MG PO TABS   Oral   Take 1 tablet (100 mg total)  by mouth 2 (two) times daily.   60 tablet   1   . TRAZODONE HCL 100 MG PO TABS   Oral   Take 100 mg by mouth at bedtime.         Marland Kitchen CLONIDINE HCL 0.3 MG PO TABS   Oral   Take 1 tablet (0.3 mg total) by mouth 2 (two) times daily.   30 tablet   1   . FUROSEMIDE 20 MG PO TABS   Oral   Take 1 tablet (20 mg total) by mouth daily.   30 tablet   1   . LISINOPRIL 20 MG PO TABS   Oral   Take 1 tablet (20 mg total) by mouth daily.   30 tablet   1     BP 158/100  Pulse 91  Temp 98.4 F (36.9 C) (Oral)  Resp 18  SpO2 96%  Physical Exam  Constitutional: He is oriented to person, place, and time. He appears well-developed and well-nourished.       obese  HENT:  Head: Normocephalic and atraumatic.  Eyes: Pupils are equal, round, and reactive to light. Scleral icterus is present.  Neck: Normal range of motion.  Cardiovascular: Normal rate and regular rhythm.   Pulmonary/Chest: Effort normal and breath sounds normal.  Abdominal: Soft. Bowel sounds are normal. He exhibits no distension. There is no tenderness.  Musculoskeletal: Normal range of motion. He exhibits no edema.  Neurological: He is alert and oriented to person, place, and time.  Skin: Skin is warm and dry. No rash noted. No erythema.    ED Course  Procedures (including critical care time)  Labs Reviewed  CBC WITH DIFFERENTIAL - Abnormal; Notable for the following:    WBC 10.7 (*)     RBC 5.99 (*)     Hemoglobin 18.2 (*)     HCT 53.6 (*)     Neutrophils Relative 90 (*)     Neutro Abs 9.6 (*)     Lymphocytes Relative 2 (*)     Lymphs Abs 0.2 (*)     All other components within normal limits  COMPREHENSIVE METABOLIC PANEL - Abnormal; Notable for the following:    CO2 18 (*)     Glucose, Bld 151 (*)     BUN 31 (*)     Total Protein 8.6 (*)     AST 50 (*)     Alkaline Phosphatase 121 (*)     GFR calc non Af Amer 65 (*)     GFR calc Af Amer 76 (*)     All other  components within normal limits    URINALYSIS, MICROSCOPIC ONLY - Abnormal; Notable for the following:    Color, Urine ORANGE (*)  BIOCHEMICALS MAY BE AFFECTED BY COLOR   APPearance HAZY (*)     Specific Gravity, Urine 1.041 (*)     Hgb urine dipstick MODERATE (*)     Bilirubin Urine MODERATE (*)     Ketones, ur 15 (*)     Protein, ur >300 (*)     Nitrite POSITIVE (*)     Leukocytes, UA SMALL (*)     Bacteria, UA FEW (*)     Squamous Epithelial / LPF FEW (*)     Casts GRANULAR CAST (*)     All other components within normal limits  LIPASE, BLOOD  URINE CULTURE   No results found.   1. Nausea, vomiting and diarrhea   2. Hypertension       MDM  Viral illness vs food poisoning will hydrate and treat symptoms  Tolerated PO        Garald Balding, NP 02/08/12 2217  Garald Balding, NP 02/08/12 2218

## 2012-02-09 LAB — URINE CULTURE

## 2012-02-09 NOTE — ED Provider Notes (Signed)
Medical screening examination/treatment/procedure(s) were performed by non-physician practitioner and as supervising physician I was immediately available for consultation/collaboration.  Orpah Greek, MD 02/09/12 [phone]

## 2012-02-12 ENCOUNTER — Other Ambulatory Visit: Payer: Self-pay | Admitting: *Deleted

## 2012-02-12 MED ORDER — CLONIDINE HCL 0.3 MG PO TABS: 0.3000 mg | ORAL_TABLET | Freq: Two times a day (BID) | ORAL | Status: DC

## 2012-02-12 MED ORDER — FUROSEMIDE 20 MG PO TABS: 20.0000 mg | ORAL_TABLET | Freq: Every day | ORAL | Status: DC

## 2012-02-16 ENCOUNTER — Other Ambulatory Visit: Payer: Self-pay | Admitting: *Deleted

## 2012-02-16 MED ORDER — LABETALOL HCL 100 MG PO TABS: 100.0000 mg | ORAL_TABLET | Freq: Two times a day (BID) | ORAL | Status: DC

## 2012-02-19 ENCOUNTER — Emergency Department (HOSPITAL_COMMUNITY): Payer: PRIVATE HEALTH INSURANCE

## 2012-02-19 ENCOUNTER — Inpatient Hospital Stay (HOSPITAL_COMMUNITY)
Admission: EM | Admit: 2012-02-19 | Discharge: 2012-02-29 | DRG: 064 | Disposition: A | Discharge status: Rehabilitation Facility | Payer: PRIVATE HEALTH INSURANCE | Attending: Infectious Disease | Admitting: Infectious Disease

## 2012-02-19 ENCOUNTER — Other Ambulatory Visit: Payer: Self-pay

## 2012-02-19 DIAGNOSIS — R279 Unspecified lack of coordination: Secondary | ICD-10-CM | POA: Diagnosis present

## 2012-02-19 DIAGNOSIS — R7401 Elevation of levels of liver transaminase levels: Secondary | ICD-10-CM | POA: Diagnosis present

## 2012-02-19 DIAGNOSIS — E875 Hyperkalemia: Secondary | ICD-10-CM | POA: Diagnosis not present

## 2012-02-19 DIAGNOSIS — F209 Schizophrenia, unspecified: Secondary | ICD-10-CM | POA: Diagnosis present

## 2012-02-19 DIAGNOSIS — I639 Cerebral infarction, unspecified: Secondary | ICD-10-CM

## 2012-02-19 DIAGNOSIS — I426 Alcoholic cardiomyopathy: Secondary | ICD-10-CM | POA: Diagnosis present

## 2012-02-19 DIAGNOSIS — Q211 Atrial septal defect: Secondary | ICD-10-CM

## 2012-02-19 DIAGNOSIS — R4789 Other speech disturbances: Secondary | ICD-10-CM | POA: Diagnosis present

## 2012-02-19 DIAGNOSIS — K7030 Alcoholic cirrhosis of liver without ascites: Secondary | ICD-10-CM | POA: Diagnosis present

## 2012-02-19 DIAGNOSIS — R031 Nonspecific low blood-pressure reading: Secondary | ICD-10-CM | POA: Diagnosis not present

## 2012-02-19 DIAGNOSIS — I10 Essential (primary) hypertension: Secondary | ICD-10-CM | POA: Diagnosis present

## 2012-02-19 DIAGNOSIS — I2789 Other specified pulmonary heart diseases: Secondary | ICD-10-CM | POA: Diagnosis present

## 2012-02-19 DIAGNOSIS — K7200 Acute and subacute hepatic failure without coma: Secondary | ICD-10-CM | POA: Diagnosis present

## 2012-02-19 DIAGNOSIS — N170 Acute kidney failure with tubular necrosis: Secondary | ICD-10-CM | POA: Diagnosis present

## 2012-02-19 DIAGNOSIS — N189 Chronic kidney disease, unspecified: Secondary | ICD-10-CM | POA: Diagnosis present

## 2012-02-19 DIAGNOSIS — E8729 Other acidosis: Secondary | ICD-10-CM

## 2012-02-19 DIAGNOSIS — E872 Acidosis, unspecified: Secondary | ICD-10-CM | POA: Diagnosis not present

## 2012-02-19 DIAGNOSIS — I509 Heart failure, unspecified: Secondary | ICD-10-CM | POA: Diagnosis present

## 2012-02-19 DIAGNOSIS — B182 Chronic viral hepatitis C: Secondary | ICD-10-CM | POA: Diagnosis present

## 2012-02-19 DIAGNOSIS — D6959 Other secondary thrombocytopenia: Secondary | ICD-10-CM | POA: Diagnosis not present

## 2012-02-19 DIAGNOSIS — N179 Acute kidney failure, unspecified: Secondary | ICD-10-CM | POA: Diagnosis present

## 2012-02-19 DIAGNOSIS — R4182 Altered mental status, unspecified: Secondary | ICD-10-CM | POA: Diagnosis present

## 2012-02-19 DIAGNOSIS — F1010 Alcohol abuse, uncomplicated: Secondary | ICD-10-CM | POA: Diagnosis present

## 2012-02-19 DIAGNOSIS — M6282 Rhabdomyolysis: Secondary | ICD-10-CM | POA: Diagnosis present

## 2012-02-19 DIAGNOSIS — B1920 Unspecified viral hepatitis C without hepatic coma: Secondary | ICD-10-CM

## 2012-02-19 DIAGNOSIS — G459 Transient cerebral ischemic attack, unspecified: Secondary | ICD-10-CM

## 2012-02-19 DIAGNOSIS — I5042 Chronic combined systolic (congestive) and diastolic (congestive) heart failure: Secondary | ICD-10-CM | POA: Diagnosis present

## 2012-02-19 DIAGNOSIS — E669 Obesity, unspecified: Secondary | ICD-10-CM | POA: Diagnosis present

## 2012-02-19 DIAGNOSIS — Z79899 Other long term (current) drug therapy: Secondary | ICD-10-CM

## 2012-02-19 DIAGNOSIS — Q2111 Secundum atrial septal defect: Secondary | ICD-10-CM

## 2012-02-19 DIAGNOSIS — D696 Thrombocytopenia, unspecified: Secondary | ICD-10-CM

## 2012-02-19 DIAGNOSIS — I129 Hypertensive chronic kidney disease with stage 1 through stage 4 chronic kidney disease, or unspecified chronic kidney disease: Secondary | ICD-10-CM | POA: Diagnosis present

## 2012-02-19 DIAGNOSIS — I959 Hypotension, unspecified: Secondary | ICD-10-CM | POA: Diagnosis not present

## 2012-02-19 DIAGNOSIS — I6340 Cerebral infarction due to embolism of unspecified cerebral artery: Principal | ICD-10-CM | POA: Diagnosis present

## 2012-02-19 DIAGNOSIS — E782 Mixed hyperlipidemia: Secondary | ICD-10-CM | POA: Diagnosis present

## 2012-02-19 HISTORY — DX: Heparin induced thrombocytopenia (HIT): D75.82

## 2012-02-19 HISTORY — DX: Heparin-induced thrombocytopenia, unspecified: D75.829

## 2012-02-19 HISTORY — DX: Unspecified viral hepatitis C without hepatic coma: B19.20

## 2012-02-19 HISTORY — DX: Cocaine abuse, in remission: F14.11

## 2012-02-19 LAB — CBC WITH DIFFERENTIAL/PLATELET
Basophils Absolute: 0 10*3/uL (ref 0.0–0.1)
Basophils Relative: 0 % (ref 0–1)
Eosinophils Relative: 0 % (ref 0–5)
HCT: 46.1 % (ref 39.0–52.0)
Lymphocytes Relative: 9 % — ABNORMAL LOW (ref 12–46)
MCHC: 34.5 g/dL (ref 30.0–36.0)
MCV: 90.9 fL (ref 78.0–100.0)
Monocytes Absolute: 0.5 10*3/uL (ref 0.1–1.0)
Monocytes Relative: 4 % (ref 3–12)
RDW: 14.4 % (ref 11.5–15.5)

## 2012-02-19 LAB — POCT I-STAT, CHEM 8
Calcium, Ion: 1.12 mmol/L (ref 1.12–1.23)
Glucose, Bld: 156 mg/dL — ABNORMAL HIGH (ref 70–99)
HCT: 50 % (ref 39.0–52.0)
Hemoglobin: 17 g/dL (ref 13.0–17.0)
Potassium: 4.9 mEq/L (ref 3.5–5.1)
TCO2: 16 mmol/L (ref 0–100)

## 2012-02-19 LAB — ETHANOL: Alcohol, Ethyl (B): 105 mg/dL — ABNORMAL HIGH (ref 0–11)

## 2012-02-19 LAB — PROTIME-INR
INR: 1.2 (ref 0.00–1.49)
Prothrombin Time: 15 seconds (ref 11.6–15.2)

## 2012-02-19 LAB — COMPREHENSIVE METABOLIC PANEL
AST: 521 U/L — ABNORMAL HIGH (ref 0–37)
BUN: 15 mg/dL (ref 6–23)
CO2: 16 mEq/L — ABNORMAL LOW (ref 19–32)
Calcium: 9.2 mg/dL (ref 8.4–10.5)
Creatinine, Ser: 2.34 mg/dL — ABNORMAL HIGH (ref 0.50–1.35)
GFR calc non Af Amer: 29 mL/min — ABNORMAL LOW (ref >90)

## 2012-02-19 LAB — APTT: aPTT: 29 seconds (ref 24–37)

## 2012-02-19 LAB — POCT I-STAT TROPONIN I

## 2012-02-19 MED ORDER — SODIUM CHLORIDE 0.9 % IV SOLN
INTRAVENOUS | Status: DC
  Administered 2012-02-19 – 2012-02-20 (×2): via INTRAVENOUS

## 2012-02-19 MED ORDER — NALOXONE HCL 0.4 MG/ML IJ SOLN
0.4000 mg | Freq: Once | INTRAMUSCULAR | Status: AC
  Administered 2012-02-19: 0.4 mg via INTRAVENOUS

## 2012-02-19 MED ORDER — NALOXONE HCL 0.4 MG/ML IJ SOLN
0.4000 mg | Freq: Once | INTRAMUSCULAR | Status: AC
  Administered 2012-02-19: 0.4 mg via INTRAVENOUS
  Filled 2012-02-19: qty 2

## 2012-02-19 MED ORDER — NALOXONE HCL 0.4 MG/ML IJ SOLN
INTRAMUSCULAR | Status: AC
  Filled 2012-02-19: qty 3

## 2012-02-19 NOTE — Code Documentation (Signed)
Patient in normal state of health this AM per EMS via roommate, patient stated around 2200 he started to feel different and had difficulty with his speech per roommate. At 2240 EMS noted dysarthria on arrival. Code stroke called at 2253, patient arrived at 2302, EDP exam at 2303, stroke team arrived at 2258, LSN early AM by roommate, patient arrived to [place] at 2305, phlebotomist arrived at 2300, CT read by Dr. Nicole Kindred at 2310, Code stroke cancelled at 2332 based on time frame. Initial NIH 2

## 2012-02-20 ENCOUNTER — Encounter (HOSPITAL_COMMUNITY): Payer: Self-pay | Admitting: *Deleted

## 2012-02-20 ENCOUNTER — Inpatient Hospital Stay (HOSPITAL_COMMUNITY): Payer: PRIVATE HEALTH INSURANCE

## 2012-02-20 DIAGNOSIS — R4182 Altered mental status, unspecified: Secondary | ICD-10-CM

## 2012-02-20 DIAGNOSIS — G459 Transient cerebral ischemic attack, unspecified: Secondary | ICD-10-CM | POA: Diagnosis present

## 2012-02-20 DIAGNOSIS — F209 Schizophrenia, unspecified: Secondary | ICD-10-CM

## 2012-02-20 DIAGNOSIS — I10 Essential (primary) hypertension: Secondary | ICD-10-CM | POA: Diagnosis present

## 2012-02-20 LAB — GLUCOSE, CAPILLARY
Glucose-Capillary: 89 mg/dL (ref 70–99)
Glucose-Capillary: 108 mg/dL — ABNORMAL HIGH (ref 70–99)

## 2012-02-20 LAB — LIPID PANEL: LDL Cholesterol: 121 mg/dL — ABNORMAL HIGH (ref 0–99)

## 2012-02-20 LAB — RAPID URINE DRUG SCREEN, HOSP PERFORMED
Barbiturates: NOT DETECTED
Benzodiazepines: NOT DETECTED

## 2012-02-20 LAB — CBC
HCT: 48.9 % (ref 39.0–52.0)
Hemoglobin: 16.4 g/dL (ref 13.0–17.0)
MCV: 90.6 fL (ref 78.0–100.0)
WBC: 11.2 10*3/uL — ABNORMAL HIGH (ref 4.0–10.5)

## 2012-02-20 LAB — URINALYSIS, ROUTINE W REFLEX MICROSCOPIC
Bilirubin Urine: NEGATIVE
Nitrite: NEGATIVE
Specific Gravity, Urine: 1.021 (ref 1.005–1.030)
Urobilinogen, UA: 0.2 mg/dL (ref 0.0–1.0)

## 2012-02-20 LAB — HEMOGLOBIN A1C
Hgb A1c MFr Bld: 5.8 % — ABNORMAL HIGH (ref <5.7)
Mean Plasma Glucose: 120 mg/dL — ABNORMAL HIGH (ref <117)

## 2012-02-20 LAB — URINE MICROSCOPIC-ADD ON

## 2012-02-20 LAB — HIV ANTIBODY (ROUTINE TESTING W REFLEX): HIV: NONREACTIVE

## 2012-02-20 LAB — CREATININE, SERUM
GFR calc Af Amer: 26 mL/min — ABNORMAL LOW (ref >90)
GFR calc non Af Amer: 22 mL/min — ABNORMAL LOW (ref >90)

## 2012-02-20 MED ORDER — OXYCODONE HCL 5 MG PO TABS
5.0000 mg | ORAL_TABLET | ORAL | Status: DC | PRN
  Administered 2012-02-20 – 2012-02-25 (×18): 5 mg via ORAL
  Filled 2012-02-20 (×18): qty 1

## 2012-02-20 MED ORDER — FUROSEMIDE 20 MG PO TABS
20.0000 mg | ORAL_TABLET | Freq: Every day | ORAL | Status: DC
  Administered 2012-02-20: 20 mg via ORAL
  Filled 2012-02-20: qty 1

## 2012-02-20 MED ORDER — KETOROLAC TROMETHAMINE 30 MG/ML IJ SOLN
30.0000 mg | Freq: Four times a day (QID) | INTRAMUSCULAR | Status: DC | PRN
  Administered 2012-02-20 (×2): 30 mg via INTRAVENOUS
  Filled 2012-02-20 (×2): qty 1

## 2012-02-20 MED ORDER — CLONIDINE HCL 0.3 MG PO TABS
0.3000 mg | ORAL_TABLET | Freq: Two times a day (BID) | ORAL | Status: DC
  Administered 2012-02-20 (×2): 0.3 mg via ORAL
  Filled 2012-02-20 (×4): qty 1

## 2012-02-20 MED ORDER — TRAZODONE HCL 100 MG PO TABS
100.0000 mg | ORAL_TABLET | Freq: Every day | ORAL | Status: DC
  Administered 2012-02-20 – 2012-02-26 (×7): 100 mg via ORAL
  Filled 2012-02-20 (×11): qty 1

## 2012-02-20 MED ORDER — LABETALOL HCL 100 MG PO TABS
100.0000 mg | ORAL_TABLET | Freq: Two times a day (BID) | ORAL | Status: DC
  Administered 2012-02-20 (×2): 100 mg via ORAL
  Filled 2012-02-20 (×4): qty 1

## 2012-02-20 MED ORDER — LISINOPRIL 20 MG PO TABS
20.0000 mg | ORAL_TABLET | Freq: Every day | ORAL | Status: DC
  Administered 2012-02-20: 20 mg via ORAL
  Filled 2012-02-20: qty 1

## 2012-02-20 MED ORDER — ASPIRIN 325 MG PO TABS
325.0000 mg | ORAL_TABLET | Freq: Every day | ORAL | Status: DC
  Administered 2012-02-20 – 2012-02-29 (×10): 325 mg via ORAL
  Filled 2012-02-20 (×10): qty 1

## 2012-02-20 MED ORDER — ACETAMINOPHEN 325 MG PO TABS
650.0000 mg | ORAL_TABLET | ORAL | Status: DC | PRN
  Filled 2012-02-20: qty 2

## 2012-02-20 MED ORDER — ENOXAPARIN SODIUM 40 MG/0.4ML ~~LOC~~ SOLN
40.0000 mg | Freq: Every day | SUBCUTANEOUS | Status: DC
  Administered 2012-02-20 – 2012-02-21 (×2): 40 mg via SUBCUTANEOUS
  Filled 2012-02-20 (×2): qty 0.4

## 2012-02-20 NOTE — Progress Notes (Signed)
EEG completed.

## 2012-02-20 NOTE — ED Provider Notes (Signed)
History     CSN: [id number]  Arrival date & time 02/19/12  2302   First MD Initiated Contact with Patient 02/19/12 2305      Chief Complaint  Patient presents with  . Code Stroke    (Consider location/radiation/quality/duration/timing/severity/associated sxs/prior treatment) Patient is a 57 y.o. male presenting with altered mental status. The history is provided by the EMS personnel and the patient. The history is limited by the condition of the patient. No language interpreter was used.  Altered Mental Status This is a new problem. Episode onset: unknown. The problem has not changed since onset.Pertinent negatives include no chest pain, no abdominal pain, no headaches and no shortness of breath. Nothing aggravates the symptoms. Nothing relieves the symptoms. He has tried nothing for the symptoms. The treatment provided no relief.    Past Medical History  Diagnosis Date  . Hypertension   . Schizophrenia     Past Surgical History  Procedure Date  . Back surgery     History reviewed. No pertinent family history.  History  Substance Use Topics  . Smoking status: Never Smoker   . Smokeless tobacco: Not on file  . Alcohol Use: Yes      Review of Systems  Respiratory: Negative for shortness of breath.   Cardiovascular: Negative for chest pain.  Gastrointestinal: Negative for nausea, vomiting and abdominal pain.  Neurological: Negative for facial asymmetry and headaches.  Psychiatric/Behavioral: Positive for altered mental status.  All other systems reviewed and are negative.    Allergies  Review of patient's allergies indicates no known allergies.  Home Medications   Current Outpatient Rx  Name  Route  Sig  Dispense  Refill  . CLONIDINE HCL 0.3 MG PO TABS   Oral   Take 0.3 mg by mouth 2 (two) times daily.         . FUROSEMIDE 20 MG PO TABS   Oral   Take 20 mg by mouth daily.         Marland Kitchen LABETALOL HCL 100 MG PO TABS   Oral   Take 100 mg by mouth 2 (two)  times daily.         Marland Kitchen LISINOPRIL 20 MG PO TABS   Oral   Take 20 mg by mouth daily.         . TRAZODONE HCL 100 MG PO TABS   Oral   Take 100 mg by mouth at bedtime.           BP 103/69  Pulse 76  Temp 98.1 F (36.7 C) (Oral)  Resp 18  SpO2 97%  Physical Exam  Constitutional: He appears well-developed and well-nourished. No distress.  HENT:  Head: Normocephalic and atraumatic.  Mouth/Throat: Oropharynx is clear and moist.  Eyes: Conjunctivae normal and EOM are normal.  Neck: Normal range of motion. Neck supple.  Cardiovascular: Normal rate, regular rhythm and intact distal pulses.   Pulmonary/Chest: Effort normal and breath sounds normal. No stridor. He has no wheezes. He has no rales.  Abdominal: Soft. Bowel sounds are normal.  Musculoskeletal: Normal range of motion. He exhibits no edema.  Neurological: He is alert. He has normal reflexes. No cranial nerve deficit.  Skin: Skin is warm and dry.  Psychiatric: He has a normal mood and affect.    ED Course  Procedures (including critical care time)  Labs Reviewed  CBC WITH DIFFERENTIAL - Abnormal; Notable for the following:    WBC 11.1 (*)     Neutrophils Relative 87 (*)  Neutro Abs 9.6 (*)     Lymphocytes Relative 9 (*)     All other components within normal limits  COMPREHENSIVE METABOLIC PANEL - Abnormal; Notable for the following:    CO2 16 (*)     Glucose, Bld 156 (*)     Creatinine, Ser 2.34 (*)     AST 521 (*)     ALT 298 (*)     Alkaline Phosphatase 123 (*)     GFR calc non Af Amer 29 (*)     GFR calc Af Amer 34 (*)     All other components within normal limits  POCT I-STAT, CHEM 8 - Abnormal; Notable for the following:    Creatinine, Ser 2.40 (*)     Glucose, Bld 156 (*)     All other components within normal limits  POCT I-STAT TROPONIN I - Abnormal; Notable for the following:    Troponin i, poc 0.21 (*)     All other components within normal limits  ETHANOL - Abnormal; Notable for the  following:    Alcohol, Ethyl (B) 105 (*)     All other components within normal limits  URINALYSIS, ROUTINE W REFLEX MICROSCOPIC - Abnormal; Notable for the following:    Color, Urine AMBER (*)  BIOCHEMICALS MAY BE AFFECTED BY COLOR   APPearance CLOUDY (*)     Hgb urine dipstick LARGE (*)     Protein, ur >300 (*)     Leukocytes, UA TRACE (*)     All other components within normal limits  GLUCOSE, CAPILLARY - Abnormal; Notable for the following:    Glucose-Capillary 150 (*)     All other components within normal limits  URINE MICROSCOPIC-ADD ON - Abnormal; Notable for the following:    Bacteria, UA MANY (*)     Casts GRANULAR CAST (*)     All other components within normal limits  PROTIME-INR  APTT  TROPONIN I  URINE RAPID DRUG SCREEN (HOSP PERFORMED)   Ct Head Wo Contrast  02/19/2012  *RADIOLOGY REPORT*  Clinical Data: Code stroke.  CT HEAD WITHOUT CONTRAST  Technique:  Contiguous axial images were obtained from the base of the skull through the vertex without contrast.  Comparison: None.  Findings: There is no definite evidence of acute infarction, mass lesion, or intra- or extra-axial hemorrhage on CT.  Mild subcortical white matter hypoattenuation is noted at the high left frontoparietal region; this may reflect small vessel ischemic microangiopathy, or possibly an infarct of indeterminate age.  The posterior fossa, including the cerebellum, brainstem and fourth ventricle, is within normal limits.  The third and lateral ventricles, and basal ganglia are unremarkable in appearance.  No mass effect or midline shift is seen.  There is no evidence of fracture; visualized osseous structures are unremarkable in appearance.  The orbits are within normal limits. The paranasal sinuses and mastoid air cells are well-aerated.  No significant soft tissue abnormalities are seen.  IMPRESSION:  1.  Mild subcortical white matter change at the high left frontoparietal region; this may reflect small vessel  ischemic microangiopathy, or possibly a small infarct of indeterminate age. 2.  Otherwise unremarkable noncontrast CT of the head.  These results were called by telephone on 02/19/2012 at 11:18 p.m. to Dr. Wallie Char, who verbally acknowledged these results.   Original Report Authenticated By: Santa Lighter, M.D.    Dg Pelvis Portable  02/19/2012  *RADIOLOGY REPORT*  Clinical Data: Status post fall; concern for pelvic injury.  PORTABLE PELVIS  Comparison: None.  Findings: There is no evidence of fracture or dislocation.  Both femoral heads are seated normally within their respective acetabula.  Lumbar spinal fusion hardware is partially imaged.  The sacroiliac joints are unremarkable in appearance.  The visualized bowel gas pattern is grossly unremarkable in appearance.  IMPRESSION: No evidence of fracture or dislocation.   Original Report Authenticated By: Santa Lighter, M.D.    Dg Chest Portable 1 View  02/19/2012  *RADIOLOGY REPORT*  Clinical Data: Code stroke.  Fall. Shortness of breath.  Weakness.  PORTABLE CHEST - 1 VIEW  Comparison: None.  Findings:  Cardiomegaly.  Prominence of the mediastinum may reflect portable technique and magnification although underlying mass or tortuous aorta not excluded.  Central pulmonary vascular prominence without frank pulmonary edema.  Left base subsegmental atelectasis.  No obvious fracture or gross pneumothorax.  The patient would eventually benefit from follow-up two-view chest with cardiac leads removed.  IMPRESSION: Cardiomegaly.  Prominence of the mediastinum may reflect portable technique and magnification although underlying mass or tortuous aorta not excluded.  Central pulmonary vascular prominence without frank pulmonary edema.  Left base subsegmental atelectasis.   Original Report Authenticated By: Genia Del, M.D.      1. Transient ischemic attack   2. Altered mental status   3. Schizophrenia       MDM   Date: 02/20/2012  Rate:74  Rhythm:  normal sinus rhythm  QRS Axis: normal  Intervals: normal  ST/T Wave abnormalities: nonspecific ST changes  Conduction Disutrbances:none  Narrative Interpretation:   Old EKG Reviewed: none available          Viona Hosking K Adeoluwa Silvers-Rasch, MD 02/20/12 AT:[id number]

## 2012-02-20 NOTE — H&P (Signed)
[facility] History and Physical  [name] [name] [id number] DOB: November 13, 1955    PCP:   None  Chief Complaint: altered mental status.  HPI: [name] [name] is an 57 y.o. male with hx of Schizophrenia, HTN, Hep C, Hx of polysubstance abuse, brought to the ER with altered mental status.  He was not able to give insightful history, so it was difficult to ascertain his illness.  Information was obtained directly from the EDP, and medical record.  He was having confusion and slurred speech.  He didn't have any seizure, HA, stiffneck, fever or chills.  He fell yesterday and complained of hurting his left hip.  Evaluation in the ER showed a negative hip film, a head CT showed ischemic disease, with possible small infarct of undeterminable age.  His EKG showed NSR without any acute ST-T changes. He has no fever and WBC of 11K.  His Cr is 2.4, but his LFTs were very elevated. His CXR showed no definite infiltrate or vascular congestion.  Hospitalist was asked to admit him for altered mental status,  ?TIA.  Rewiew of Systems:   I was not able to obtain meaningful ROS.  He said he has no HA, CP, SOB, But was having left hip pain.     Past Medical History  Diagnosis Date  . Hypertension   . Schizophrenia   . Hepatitis C   . HIT (heparin-induced thrombocytopenia)   . H/O: alcohol abuse   . H/O cocaine abuse     Past Surgical History  Procedure Date  . Back surgery     Medications:  HOME MEDS: Prior to Admission medications   Medication Sig Start Date End Date Taking? Authorizing Provider  cloNIDine (CATAPRES) 0.3 MG tablet Take 0.3 mg by mouth 2 (two) times daily.   Yes Historical Provider, MD  furosemide (LASIX) 20 MG tablet Take 20 mg by mouth daily.   Yes Historical Provider, MD  labetalol (NORMODYNE) 100 MG tablet Take 100 mg by mouth 2 (two) times daily.   Yes Historical Provider, MD  lisinopril (PRINIVIL,ZESTRIL) 20 MG tablet Take 20 mg by mouth daily.   Yes Historical  Provider, MD  traZODone (DESYREL) 100 MG tablet Take 100 mg by mouth at bedtime.   Yes Historical Provider, MD     Allergies:  No Known Allergies  Social History:   reports that he has never smoked. He does not have any smokeless tobacco history on file. He reports that he drinks alcohol. He reports that he does not use illicit drugs.  Family History: Family History  Problem Relation Age of Onset  . CAD Mother   . CAD Father   . CAD Sister      Physical Exam: Filed Vitals:   02/20/12 0300 02/20/12 0312 02/20/12 0340 02/20/12 0600  BP: 133/77  136/86 140/91  Pulse: 77 80 76 76  Temp:   97.6 F (36.4 C) 97.6 F (36.4 C)  TempSrc:   Oral Oral  Resp: 28 28 20 22   Height:  2' 3.95" (0.71 m) 6' (1.829 m)   Weight:  107.049 kg (236 lb) 112.038 kg (247 lb)   SpO2: 98% 99% 99% 100%   Blood pressure 140/91, pulse 76, temperature 97.6 F (36.4 C), temperature source Oral, resp. rate 22, height 6' (1.829 m), weight 112.038 kg (247 lb), SpO2 100.00%.  GEN:  patient lying in the stretcher in no acute distress; cooperative with exam. PSYCH:  alert and oriented x4; does not appear anxious or depressed; affect is  appropriate. HEENT: Mucous membranes pink and anicteric; PERRLA; EOM intact; no cervical lymphadenopathy nor thyromegaly or carotid bruit; no JVD; There were no stridor. Neck is very supple. Breasts:: Not examined CHEST WALL: No tenderness CHEST: Normal respiration, clear to auscultation bilaterally.  HEART: Regular rate and rhythm.  There are no murmur, rub, or gallops.   BACK: No kyphosis or scoliosis; no CVA tenderness ABDOMEN: soft and non-tender; no masses, no organomegaly, normal abdominal bowel sounds; no pannus; no intertriginous candida. There is no rebound and no distention. Rectal Exam: Not done EXTREMITIES: No bone or joint deformity; age-appropriate arthropathy of the hands and knees; no edema; no ulcerations.  There is no calf tenderness. Genitalia: not  examined PULSES: 2+ and symmetric SKIN: Normal hydration no rash or ulceration CNS: Cranial nerves 2-12 grossly intact no focal lateralizing neurologic deficit.  Speech is slighly slurred  uvula elevated with phonation, facial symmetry and tongue midline. DTR are normal bilaterally, barbinski is negative and strengths are equaled bilaterally.  No sensory loss.   Labs on Admission:  Basic Metabolic Panel:  Lab [id number] 2314 02/19/12 2308  NA 140 135  K 4.9 4.8  CL 110 99  CO2 -- 16*  GLUCOSE 156* 156*  BUN 16 15  CREATININE 2.40* 2.34*  CALCIUM -- 9.2  MG -- --  PHOS -- --   Liver Function Tests:  Lab 02/19/12 2308  AST 521*  ALT 298*  ALKPHOS 123*  BILITOT 0.5  PROT 7.8  ALBUMIN 3.6   No results found for this basename: LIPASE:5,AMYLASE:5 in the last 168 hours No results found for this basename: AMMONIA:5 in the last 168 hours CBC:  Lab 02/19/12 2314 02/19/12 2308  WBC -- 11.1*  NEUTROABS -- 9.6*  HGB 17.0 15.9  HCT 50.0 46.1  MCV -- 90.9  PLT -- 165   Cardiac Enzymes:  Lab 02/19/12 2309  CKTOTAL --  CKMB --  CKMBINDEX --  TROPONINI <0.30    CBG:  Lab 02/20/12 0648 02/19/12 2323  GLUCAP 89 150*     Radiological Exams on Admission: Ct Head Wo Contrast  02/19/2012  *RADIOLOGY REPORT*  Clinical Data: Code stroke.  CT HEAD WITHOUT CONTRAST  Technique:  Contiguous axial images were obtained from the base of the skull through the vertex without contrast.  Comparison: None.  Findings: There is no definite evidence of acute infarction, mass lesion, or intra- or extra-axial hemorrhage on CT.  Mild subcortical white matter hypoattenuation is noted at the high left frontoparietal region; this may reflect small vessel ischemic microangiopathy, or possibly an infarct of indeterminate age.  The posterior fossa, including the cerebellum, brainstem and fourth ventricle, is within normal limits.  The third and lateral ventricles, and basal ganglia are unremarkable in  appearance.  No mass effect or midline shift is seen.  There is no evidence of fracture; visualized osseous structures are unremarkable in appearance.  The orbits are within normal limits. The paranasal sinuses and mastoid air cells are well-aerated.  No significant soft tissue abnormalities are seen.  IMPRESSION:  1.  Mild subcortical white matter change at the high left frontoparietal region; this may reflect small vessel ischemic microangiopathy, or possibly a small infarct of indeterminate age. 2.  Otherwise unremarkable noncontrast CT of the head.  These results were called by telephone on 02/19/2012 at 11:18 p.m. to Dr. Wallie Char, who verbally acknowledged these results.   Original Report Authenticated By: Santa Lighter, M.D.    Dg Pelvis Portable  02/19/2012  *RADIOLOGY REPORT*  Clinical Data: Status post fall; concern for pelvic injury.  PORTABLE PELVIS  Comparison: None.  Findings: There is no evidence of fracture or dislocation.  Both femoral heads are seated normally within their respective acetabula.  Lumbar spinal fusion hardware is partially imaged.  The sacroiliac joints are unremarkable in appearance.  The visualized bowel gas pattern is grossly unremarkable in appearance.  IMPRESSION: No evidence of fracture or dislocation.   Original Report Authenticated By: Santa Lighter, M.D.    Dg Chest Portable 1 View  02/19/2012  *RADIOLOGY REPORT*  Clinical Data: Code stroke.  Fall. Shortness of breath.  Weakness.  PORTABLE CHEST - 1 VIEW  Comparison: None.  Findings:  Cardiomegaly.  Prominence of the mediastinum may reflect portable technique and magnification although underlying mass or tortuous aorta not excluded.  Central pulmonary vascular prominence without frank pulmonary edema.  Left base subsegmental atelectasis.  No obvious fracture or gross pneumothorax.  The patient would eventually benefit from follow-up two-view chest with cardiac leads removed.  IMPRESSION: Cardiomegaly.  Prominence  of the mediastinum may reflect portable technique and magnification although underlying mass or tortuous aorta not excluded.  Central pulmonary vascular prominence without frank pulmonary edema.  Left base subsegmental atelectasis.   Original Report Authenticated By: Genia Del, M.D.     Assessment/Plan Present on Admission:  . Altered mental status . Schizophrenia . HTN (hypertension) . TIA (transient ischemic attack)  PLAN:  Will admit him for possible TIA although it was difficult to determine with such paucity of information from him.  Will obtain MRI of the brain along with coratid and ECHO.  I wonder if his symptoms are psychiatric with his hx of schizophrenia.  He also has elevated LFTs and will obtain RUQ [place], hep panel (though he has hx of hep C), monospot, and tylenol level.  Please consult neurology for further recommendation.  I have continue his home meds for now.  He appears stable, but I have not been able to obtain a definitive diagnosis in this case.  His Creatinine is elevated as well.  I am unclear of his baseline.  Further work up will have to depend on his clinical progression.    Other plans as per orders.  Code Status:FULL CODE.   Orvan Falconer, MD. [facility] Pager [phone] 7pm to 7am.  02/20/2012, 7:33 AM

## 2012-02-20 NOTE — Procedures (Signed)
History: 56 yo with h/o schizophrenia and altered mental status.   Sedation: None  Background: There is a well defined posterior dominant rhythm of 9 Hz that attenuates with eye opening. There is excessive sweat artifact throughout the recording. There is repeated artifact throughout the recording(patient having cramp and pulling off leads per tech) but the background is relatively unremarkable in areas that it is seen.   Photic stimulation: Physiologic driving is not performed  EEG Diagnosis: 1) Normal EEG  Clinical Interpretation: This normal EEG is recorded in the waking state. There was no seizure or seizure predisposition recorded on this study.   Roland Rack, MD [facility] [phone]  If 7pm- 7am, please page neurology on call at [phone].

## 2012-02-20 NOTE — Progress Notes (Signed)
  Echocardiogram 2D Echocardiogram has been performed.  [name] [name] 02/20/2012, 10:23 AM

## 2012-02-20 NOTE — Progress Notes (Addendum)
Patient admitted earlier this am with confusion. Has a h/o schizophrenia. Difficult historian. TIA work up is under progress. He also has transaminitis. RUQ [place] ordered. Will add an acute hepatitis panel. Also has ARF (?baseline). Will D/C lisinopril and lasix. Continue IVFs. If AMS work up is negative and he remains confused, consider MRI Brain and possibly psych eval.  Domingo Mend, MD [facility] Pager: [phone]

## 2012-02-20 NOTE — ED Notes (Signed)
Per EMS - pt from home, per pt's room mate, last seen normal this a.m. - pt reports symptoms began approx 2hrs PTA - pt w/ increased slurred speech en route - pt admits to "not feeling well for a few days" - pt found by EMS on floor sitting against a recliner. Pt also c/o rt hip pain at present.

## 2012-02-20 NOTE — Progress Notes (Signed)
VASCULAR LAB PRELIMINARY  PRELIMINARY  PRELIMINARY  PRELIMINARY  Carotid duplex  completed.    Preliminary report:  Bilateral:  No evidence of hemodynamically significant internal carotid artery stenosis.   Vertebral artery flow is antegrade.      [name] [name], RVT 02/20/2012, 11:37 AM

## 2012-02-21 ENCOUNTER — Inpatient Hospital Stay (HOSPITAL_COMMUNITY): Payer: PRIVATE HEALTH INSURANCE

## 2012-02-21 DIAGNOSIS — I6350 Cerebral infarction due to unspecified occlusion or stenosis of unspecified cerebral artery: Secondary | ICD-10-CM

## 2012-02-21 DIAGNOSIS — N179 Acute kidney failure, unspecified: Secondary | ICD-10-CM | POA: Diagnosis present

## 2012-02-21 DIAGNOSIS — G459 Transient cerebral ischemic attack, unspecified: Secondary | ICD-10-CM

## 2012-02-21 DIAGNOSIS — R7401 Elevation of levels of liver transaminase levels: Secondary | ICD-10-CM | POA: Diagnosis present

## 2012-02-21 DIAGNOSIS — R4182 Altered mental status, unspecified: Secondary | ICD-10-CM

## 2012-02-21 DIAGNOSIS — B1920 Unspecified viral hepatitis C without hepatic coma: Secondary | ICD-10-CM

## 2012-02-21 LAB — COMPREHENSIVE METABOLIC PANEL
ALT: 1638 U/L — ABNORMAL HIGH (ref 0–53)
AST: 4594 U/L — ABNORMAL HIGH (ref 0–37)
Albumin: 2.6 g/dL — ABNORMAL LOW (ref 3.5–5.2)
Alkaline Phosphatase: 109 U/L (ref 39–117)
BUN: 42 mg/dL — ABNORMAL HIGH (ref 6–23)
Chloride: 100 mEq/L (ref 96–112)
Potassium: 5.5 mEq/L — ABNORMAL HIGH (ref 3.5–5.1)
Sodium: 134 mEq/L — ABNORMAL LOW (ref 135–145)
Total Bilirubin: 0.8 mg/dL (ref 0.3–1.2)

## 2012-02-21 LAB — GLUCOSE, CAPILLARY
Glucose-Capillary: 85 mg/dL (ref 70–99)
Glucose-Capillary: 233 mg/dL — ABNORMAL HIGH (ref 70–99)
Glucose-Capillary: 57 mg/dL — ABNORMAL LOW (ref 70–99)

## 2012-02-21 LAB — SODIUM, URINE, RANDOM: Sodium, Ur: 40 mEq/L

## 2012-02-21 LAB — CBC
Hemoglobin: 14.2 g/dL (ref 13.0–17.0)
MCH: 30.6 pg (ref 26.0–34.0)
RBC: 4.64 MIL/uL (ref 4.22–5.81)

## 2012-02-21 LAB — CK: Total CK: >50000 U/L — ABNORMAL HIGH (ref 7–232)

## 2012-02-21 LAB — CREATININE, URINE, RANDOM: Creatinine, Urine: 301.47 mg/dL

## 2012-02-21 MED ORDER — DEXTROSE 50 % IV SOLN: 50.0000 mL | Freq: Once | INTRAVENOUS | Status: AC | PRN

## 2012-02-21 MED ORDER — DEXTROSE-NACL 5-0.9 % IV SOLN
INTRAVENOUS | Status: DC
  Administered 2012-02-21: 11:00:00 via INTRAVENOUS

## 2012-02-21 MED ORDER — SODIUM CHLORIDE 0.9 % IV BOLUS (SEPSIS)
500.0000 mL | Freq: Once | INTRAVENOUS | Status: AC
  Administered 2012-02-21: 500 mL via INTRAVENOUS

## 2012-02-21 MED ORDER — DEXTROSE 50 % IV SOLN
INTRAVENOUS | Status: AC
  Administered 2012-02-21: 18:00:00
  Filled 2012-02-21: qty 50

## 2012-02-21 MED ORDER — SODIUM CHLORIDE 0.9 % IV SOLN: INTRAVENOUS | Status: DC

## 2012-02-21 MED ORDER — DEXTROSE 5 % IV SOLN
1.0000 g | INTRAVENOUS | Status: DC
  Administered 2012-02-21 – 2012-02-22 (×2): 1 g via INTRAVENOUS
  Filled 2012-02-21 (×3): qty 10

## 2012-02-21 MED ORDER — ENOXAPARIN SODIUM 30 MG/0.3ML ~~LOC~~ SOLN
30.0000 mg | SUBCUTANEOUS | Status: DC
  Filled 2012-02-21: qty 0.3

## 2012-02-21 MED ORDER — LORAZEPAM 1 MG PO TABS
1.0000 mg | ORAL_TABLET | Freq: Four times a day (QID) | ORAL | Status: AC | PRN
  Administered 2012-02-23: 1 mg via ORAL
  Filled 2012-02-21: qty 1

## 2012-02-21 MED ORDER — LORAZEPAM 2 MG/ML IJ SOLN: 1.0000 mg | Freq: Four times a day (QID) | INTRAMUSCULAR | Status: AC | PRN

## 2012-02-21 MED ORDER — FOLIC ACID 1 MG PO TABS
1.0000 mg | ORAL_TABLET | Freq: Every day | ORAL | Status: DC
  Administered 2012-02-21 – 2012-02-29 (×9): 1 mg via ORAL
  Filled 2012-02-21 (×9): qty 1

## 2012-02-21 MED ORDER — VITAMIN B-1 100 MG PO TABS
100.0000 mg | ORAL_TABLET | Freq: Every day | ORAL | Status: DC
  Administered 2012-02-21 – 2012-02-29 (×9): 100 mg via ORAL
  Filled 2012-02-21 (×9): qty 1

## 2012-02-21 MED ORDER — SODIUM BICARBONATE 8.4 % IV SOLN
INTRAVENOUS | Status: DC
  Administered 2012-02-21 – 2012-02-25 (×8): via INTRAVENOUS
  Filled 2012-02-21 (×20): qty 150

## 2012-02-21 MED ORDER — ADULT MULTIVITAMIN W/MINERALS CH
1.0000 | ORAL_TABLET | Freq: Every day | ORAL | Status: DC
  Administered 2012-02-21 – 2012-02-29 (×9): 1 via ORAL
  Filled 2012-02-21 (×9): qty 1

## 2012-02-21 MED ORDER — THIAMINE HCL 100 MG/ML IJ SOLN
100.0000 mg | Freq: Every day | INTRAMUSCULAR | Status: DC
  Filled 2012-02-21 (×2): qty 1

## 2012-02-21 MED ORDER — DEXTROSE 5 % IV SOLN
INTRAVENOUS | Status: DC
  Administered 2012-02-21: 15:00:00 via INTRAVENOUS
  Filled 2012-02-21 (×3): qty 150

## 2012-02-21 NOTE — Progress Notes (Signed)
Hypoglycemic Event  CBG: 57  Treatment: D50 IV 50 mL  Symptoms: Hungry  Follow-up CBG: Time:1800 CBG Result:233  Possible Reasons for Event: Inadequate meal intake  Comments/MD notified:Hulda Marin, Alric Ran  Remember to initiate Hypoglycemia Order Set & complete

## 2012-02-21 NOTE — Progress Notes (Signed)
NPO after midnight due to ultrasound of abdomen today

## 2012-02-21 NOTE — Consult Note (Signed)
[facility] Consult Note    Date: 02/21/2012                  Patient Name:  [name] [name]  MRN: [id number]  DOB: 1955/08/27  Age / Sex: 57 y.o., male         PCP: No primary provider on file.                 Service Requesting Consult: [facility]                 Reason for Consult: Acute on Chronic Renal Failure            History of Present Illness: Patient is a 57 y.o. male with a PMHx of schizophrenia, HTN, Hep C, h/o polysubstance abuse who was admitted to [facility] on 02/19/2012 for evaluation of altered mental status with confusion and slurred speech.   Patient reports that he fell and hit his head and was down for 30 minutes prior to arrival of help. He also reports that 1 week prior to admission, he had been having nausea, non bloody emesis (4-5 times per day) and watery, non bloody diarrhea (6 x per day) with associated poor po intake and abdominal discomfort. He reports drinking alcohol during that time as well. He did not notice any decrease in urine output, but does state that it has been difficult to completely empty his bladder.  He had run out of his medications 1-2 weeks prior to admission. He also reports taking 4 aleve or ibuprofen per day for the last week to help him sleep.   On admission to the hospital, BP was in [id number] to 123456 systolic. Home BP meds clonidine, lasix, labetalol and lisinopril were started back. Patient had episode of low BP on 1/26 of 80/50. BP medications stopped, 500cc bolus given with improved BP to 123XX123 systolic. UOP has been poor.   In the ED, patient had CT head which showed mild subcortical white matter changes at the high left frontoparietal region. A follow up MRI was done which showed small acute cerebellar infarcts, right greater than left, as well as questionable punctate left brain stem infarct and questionable left external capsule and caudate nucleus punctate infarct.   Notable labs and imaging on admission  included: AST: 521, ALT: 298, UDS negative, ethanol:105, Cr: 2.34, bicarb:16. Echo: EF: 30-35% with hypokinesis of mid infero-lateral myocardium. UA with >300 protein, trace leuks, large Hg, 3-6 RBC. The following day (01/26), AST and ALT have risen to 4594 and 1638 and creatinine to 5.57. Nephrology was consulted for acute rise in creatinine.     Medications: Outpatient medications: Prescriptions prior to admission  Medication Sig Dispense Refill  . cloNIDine (CATAPRES) 0.3 MG tablet Take 0.3 mg by mouth 2 (two) times daily.      . furosemide (LASIX) 20 MG tablet Take 20 mg by mouth daily.      Marland Kitchen labetalol (NORMODYNE) 100 MG tablet Take 100 mg by mouth 2 (two) times daily.      Marland Kitchen lisinopril (PRINIVIL,ZESTRIL) 20 MG tablet Take 20 mg by mouth daily.      . traZODone (DESYREL) 100 MG tablet Take 100 mg by mouth at bedtime.        Current medications: Current Facility-Administered Medications  Medication Dose Route Frequency Provider Last Rate Last Dose  . aspirin tablet 325 mg  325 mg Oral Daily Orvan Falconer, MD   325 mg at 02/21/12 1037  . cefTRIAXone (ROCEPHIN) 1  g in dextrose 5 % 50 mL IVPB  1 g Intravenous Q24H Monika Salk, MD   1 g at 02/21/12 1036  . dextrose 5 %-0.9 % sodium chloride infusion   Intravenous Continuous Monika Salk, MD 150 mL/hr at 02/21/12 1045    . enoxaparin (LOVENOX) injection 40 mg  40 mg Subcutaneous Daily Orvan Falconer, MD   40 mg at 02/21/12 1037  . folic acid (FOLVITE) tablet 1 mg  1 mg Oral Daily Monika Salk, MD      . LORazepam (ATIVAN) tablet 1 mg  1 mg Oral Q6H PRN Monika Salk, MD       Or  . LORazepam (ATIVAN) injection 1 mg  1 mg Intravenous Q6H PRN Monika Salk, MD      . multivitamin with minerals tablet 1 tablet  1 tablet Oral Daily Monika Salk, MD      . oxyCODONE (Oxy IR/ROXICODONE) immediate release tablet 5 mg  5 mg Oral Q4H PRN Erline Hau, MD   5 mg at 02/21/12 0253  . thiamine (VITAMIN B-1) tablet 100 mg  100 mg Oral Daily Monika Salk, MD        Or  . thiamine (B-1) injection 100 mg  100 mg Intravenous Daily Monika Salk, MD      . traZODone (DESYREL) tablet 100 mg  100 mg Oral QHS Orvan Falconer, MD   100 mg at 02/20/12 2115      Allergies: No Known Allergies    Past Medical History: Past Medical History  Diagnosis Date  . Hypertension   . Schizophrenia   . Hepatitis C   . HIT (heparin-induced thrombocytopenia)   . H/O: alcohol abuse   . H/O cocaine abuse      Past Surgical History: Past Surgical History  Procedure Date  . Back surgery      Family History: Family History  Problem Relation Age of Onset  . CAD Mother   . CAD Father   . CAD Sister      Social History: Lives alone. Has a sister who lives in [place].  Alcohol: Binge drinking: 2 fifths.  Tobacco: quit 11 years ago Drugs: denies any history of IV drug use or other illicit drug use.   Family History: No kidney disease. Family history of HTN.   Review of Systems: Intermittent shortness of breath with exertion.  2 pillow orthopnea, paroxysmal nocturnal dyspnea. No increased lower extremity swelling.  Otherwise, ROS as per HPI  Vital Signs: Blood pressure 110/70, pulse 68, temperature 97.6 F (36.4 C), temperature source Oral, resp. rate 18, height 6' (1.829 m), weight 247 lb (112.038 kg), SpO2 100.00%.  Weight trends: Filed Weights   02/20/12 0312 02/20/12 0340  Weight: 236 lb (107.049 kg) 247 lb (112.038 kg)    Physical Exam: General: Vital signs reviewed and noted. Well-developed, well-nourished, in no acute distress; alert, appropriate and cooperative throughout examination.  Head: Normocephalic, atraumatic.  Eyes: PERRL, EOMI  Nose: Mucous membranes moist, not inflammed, nonerythematous.  Throat: Oropharynx nonerythematous  Neck: No deformities, masses, or tenderness noted.Supple, No carotid Bruits, no JVD.  Lungs:  Normal respiratory effort. Clear to auscultation BL, bibasilar crackles appreciated  Heart: RRR. S1 and S2  normal without gallop, murmur, or rubs.  Abdomen:  BS normoactive. Soft, non tender, distended  Extremities: No pretibial edema.  Neurologic: A&O X3, CN II - XII are grossly intact. No asterixis  Skin: No visible rashes, scars.  Lab results: Basic Metabolic Panel:  Lab [id number] 0817 02/20/12 0640 02/19/12 2314 02/19/12 2308  NA 134* -- 140 135  K 5.5* -- 4.9 4.8  CL 100 -- 110 99  CO2 16* -- -- 16*  GLUCOSE 104* -- 156* 156*  BUN 42* -- 16 15  CREATININE 5.57* 2.93* 2.40* --  CALCIUM 7.3* -- -- 9.2  MG -- -- -- --  PHOS -- -- -- --    Liver Function Tests:  Lab 02/21/12 0817 02/19/12 2308  AST 4594* 521*  ALT 1638* 298*  ALKPHOS 109 123*  BILITOT 0.8 0.5  PROT 6.0 7.8  ALBUMIN 2.6* 3.6   No results found for this basename: LIPASE:3,AMYLASE:3 in the last 168 hours No results found for this basename: AMMONIA:3 in the last 168 hours  CBC:  Lab 02/21/12 0530 02/20/12 0640 02/19/12 2314 02/19/12 2308  WBC 15.0* 11.2* -- 11.1*  NEUTROABS -- -- -- 9.6*  HGB 14.2 16.4 17.0 --  HCT 42.3 48.9 50.0 --  MCV 91.2 90.6 -- 90.9  PLT PLATELET CLUMPS NOTED ON SMEAR, COUNT APPEARS ADEQUATE PLATELET CLUMPS NOTED ON SMEAR, COUNT APPEARS ADEQUATE -- 165    Cardiac Enzymes:  Lab 02/19/12 2309  CKTOTAL --  CKMB --  CKMBINDEX --  TROPONINI <0.30    BNP: No components found with this basename: POCBNP:3  CBG:  Lab 02/21/12 0643 02/20/12 2123 02/20/12 1643 02/20/12 1148 02/20/12 0648  GLUCAP 124* 85 115* 108* 89    Microbiology: No results found for this or any previous visit.  Coagulation Studies:  Basename 02/19/12 2308  LABPROT 15.0  INR 1.20    Urinalysis:  Basename 02/20/12 0104  COLORURINE AMBER*  LABSPEC 1.021  PHURINE 5.0  GLUCOSEU NEGATIVE  HGBUR LARGE*  BILIRUBINUR NEGATIVE  KETONESUR NEGATIVE  PROTEINUR >300*  UROBILINOGEN 0.2  NITRITE NEGATIVE  LEUKOCYTESUR TRACE*      Imaging: Ct Head Wo Contrast  02/19/2012  *RADIOLOGY REPORT*   Clinical Data: Code stroke.  CT HEAD WITHOUT CONTRAST  Technique:  Contiguous axial images were obtained from the base of the skull through the vertex without contrast.  Comparison: None.  Findings: There is no definite evidence of acute infarction, mass lesion, or intra- or extra-axial hemorrhage on CT.  Mild subcortical white matter hypoattenuation is noted at the high left frontoparietal region; this may reflect small vessel ischemic microangiopathy, or possibly an infarct of indeterminate age.  The posterior fossa, including the cerebellum, brainstem and fourth ventricle, is within normal limits.  The third and lateral ventricles, and basal ganglia are unremarkable in appearance.  No mass effect or midline shift is seen.  There is no evidence of fracture; visualized osseous structures are unremarkable in appearance.  The orbits are within normal limits. The paranasal sinuses and mastoid air cells are well-aerated.  No significant soft tissue abnormalities are seen.  IMPRESSION:  1.  Mild subcortical white matter change at the high left frontoparietal region; this may reflect small vessel ischemic microangiopathy, or possibly a small infarct of indeterminate age. 2.  Otherwise unremarkable noncontrast CT of the head.  These results were called by telephone on 02/19/2012 at 11:18 p.m. to Dr. Wallie Char, who verbally acknowledged these results.   Original Report Authenticated By: Santa Lighter, M.D.    Mri Brain Without Contrast  02/20/2012  *RADIOLOGY REPORT*  Clinical Data: 57 year old male with altered mental status. Confusion and slurred speech.  MRI HEAD WITHOUT CONTRAST  Technique:  Multiplanar, multiecho pulse sequences of the brain and  surrounding structures were obtained according to standard protocol without intravenous contrast.  Comparison: Head CT without contrast 02/19/2012.  Findings: 10 mm focus of restricted diffusion in the right cerebellum (series 3 image 5).  Smaller more linear focus of  restricted effusion in the left cerebellar hemisphere (series 5 image 10).  Questionable asymmetric punctate focus in the lower brain stem on the left (series 5 image 13).  There is also evidence of a punctate focus restricted diffusion in the left external capsule anterior limb (series 5 image 28).  There might also be a nearby focus in the left caudate nucleus (series 5 image 30).  No other supratentorial diffusion abnormality.  Only subtle associated T2 hyperintensity at the larger cerebellar lesion.  No hemorrhage or mass effect. Major intracranial vascular flow voids are preserved.  Negative pituitary, cervicomedullary junction visualized cervical spine.  Normal bone marrow signal. Scattered subcortical cerebral white matter T2 and FLAIR hyperintensity not associated with the acute findings is nonspecific and mild to moderate for age.  There are increased dilated perivascular spaces in the cerebral hemispheres.  No ventriculomegaly.  Postoperative changes to the right globe. Visualized paranasal sinuses and mastoids are clear.   Negative scalp soft tissues.  IMPRESSION: 1.  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  No mass effect or hemorrhage.  Possible associated acute punctate left brain stem infarct. 2.  Questionable acute left external capsule and caudate nucleus punctate lacunar infarct. This could reflect synchronous small vessel ischemia, less likely sequelae of embolic phenomena given the different vascular territories.   Original Report Authenticated By: Roselyn Reef, M.D.    Dg Pelvis Portable  02/19/2012  *RADIOLOGY REPORT*  Clinical Data: Status post fall; concern for pelvic injury.  PORTABLE PELVIS  Comparison: None.  Findings: There is no evidence of fracture or dislocation.  Both femoral heads are seated normally within their respective acetabula.  Lumbar spinal fusion hardware is partially imaged.  The sacroiliac joints are unremarkable in appearance.  The visualized bowel gas  pattern is grossly unremarkable in appearance.  IMPRESSION: No evidence of fracture or dislocation.   Original Report Authenticated By: Santa Lighter, M.D.    Dg Chest Portable 1 View  02/19/2012  *RADIOLOGY REPORT*  Clinical Data: Code stroke.  Fall. Shortness of breath.  Weakness.  PORTABLE CHEST - 1 VIEW  Comparison: None.  Findings:  Cardiomegaly.  Prominence of the mediastinum may reflect portable technique and magnification although underlying mass or tortuous aorta not excluded.  Central pulmonary vascular prominence without frank pulmonary edema.  Left base subsegmental atelectasis.  No obvious fracture or gross pneumothorax.  The patient would eventually benefit from follow-up two-view chest with cardiac leads removed.  IMPRESSION: Cardiomegaly.  Prominence of the mediastinum may reflect portable technique and magnification although underlying mass or tortuous aorta not excluded.  Central pulmonary vascular prominence without frank pulmonary edema.  Left base subsegmental atelectasis.   Original Report Authenticated By: Genia Del, M.D.       Assessment & Plan: Pt is a 57 y.o. 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 Baseline Cr: 1.2 on 02/08/12. Poor urine output.  Urine shows granular casts as well as a couple red cell casts. Differential therefore includes: ATN from combination of hypotension, poor  po intake, ACEI and NSAIDs; vs AIN in context of NSAID use; vs Glomerular etiology (protein and red cell casts in urine), or possibly a combination.  - Renal US pending to rule out obstructive etiology - FeNa pending - Pr/Cr ratio - consider CK to rule out any component of rhabdo in context of patient being down (especially with large Hg and  3 RBCs on micro).  - urine eosinophils to rule out AIN - UPEP - ANA, ANCA, GBM, ASA, C3, C4, cryoglobulin for GN rule out.  -  Strict I's/Os  # Anion gap acidosis: likely from renal failure. - will switch from NS to sodium bicarb drip at 125cc/hr.   # Hypotension: home meds held.  - being transferred to step down unit for further monitoring  # CVA and altered mental status: followed by neurology. AMS improved.  # Transaminitis: GI consulted. Known hepatitis C. Hep A and B negative. Tylenol<15. Ethanol:105. Abdominal US pending.  # UTI: urine culture pending. On ceftriaxone   # Schizophrenia: per primary team # Alcohol abuse: on CIWA with thiamine, folic acid and multivit.   Liam Graham, PGY-2 Family Medicine Resident

## 2012-02-21 NOTE — Consult Note (Signed)
PULMONARY  / CRITICAL CARE MEDICINE  Name: [name] [name] MRN: [id number] DOB: 09-04-1955    ADMISSION DATE:  02/19/2012 CONSULTATION DATE:  1/26  REFERRING MD :  Blenda Nicely   CHIEF COMPLAINT:  Complex medical therapy   BRIEF PATIENT DESCRIPTION:  This is a 57 year old male w/ PMH schizophrenia brought to ER on 1/25 s/p fall associated w/ new cerebellar infarct. On admit was noted to have both marked renal and hepatic dysfunction (has h/o hep C). PCCM asked to eval for potential transfer to ICU on 1/26 based on worsening hepatic and renal fxn tests.   -of note did have nausea/ non-bloody emesis (4-5 X/d) and watery non-bloody stools (~6/d) about 1 week prior to admit. He had also not been taking his medications for 2 weeks prior.    SIGNIFICANT EVENTS / STUDIES:  MRI head 1/25:1. 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 No mass effect or hemorrhage. Possible associated acute punctate left brain stem infarct.2. Questionable acute left external capsule and caudate nucleus punctate lacunar infarct. This could reflect synchronous small vessel ischemia, less likely sequelae of embolic phenomena given the different vascular territories  1/25 ECHO: Difficult to visualize. The cavity size was normal. There was mild concentric hypertrophy. Systolic function was moderately to severely reduced. The estimated ejection fraction was in the range of 30% to 35%. Regional wall motion abnormalities: Possible hypokinesis of them idinferolateral myocardium. Doppler parameters are consistent with abnormal left ventricular relaxation (grade1 diastolic dysfunction). Carotid dopplers 1/26>>> Renal US 1/26>>>  LINES / TUBES:   CULTURES: UC 1/26>>>  ANTIBIOTICS: Rocephin 1/26>>>  HISTORY OF PRESENT ILLNESS:   [name] [name] is a 57 y.o. male with a history of schizpphrenia who was brought to the ER on 1/25 due to AMS. MRI revealed cerebellar infarcts. He states that he fell on 1/24, but was not  sure why, then noticed that he could not move his left side. This improved and he currently is only left with numbness of the right hand and left lower leg. Further evaluation demonstrated: acute renal failure, metabolic acidosis, and profoundly elevated LFTs. He has multiple subspecialties following: including neurology, GI, and nephrology. PCCM was asked to evaluate as pt had multiple lab abnormalities for which the med service felt required closer observation and screening for potential ICU level of care.   PAST MEDICAL HISTORY :  Past Medical History  Diagnosis Date  . Hypertension   . Schizophrenia   . Hepatitis C   . HIT (heparin-induced thrombocytopenia)   . H/O: alcohol abuse   . H/O cocaine abuse    Past Surgical History  Procedure Date  . Back surgery    Prior to Admission medications   Medication Sig Start Date End Date Taking? Authorizing Provider  cloNIDine (CATAPRES) 0.3 MG tablet Take 0.3 mg by mouth 2 (two) times daily.   Yes Historical Provider, MD  furosemide (LASIX) 20 MG tablet Take 20 mg by mouth daily.   Yes Historical Provider, MD  labetalol (NORMODYNE) 100 MG tablet Take 100 mg by mouth 2 (two) times daily.   Yes Historical Provider, MD  lisinopril (PRINIVIL,ZESTRIL) 20 MG tablet Take 20 mg by mouth daily.   Yes Historical Provider, MD  traZODone (DESYREL) 100 MG tablet Take 100 mg by mouth at bedtime.   Yes Historical Provider, MD   No Known Allergies  FAMILY HISTORY:  Family History  Problem Relation Age of Onset  . CAD Mother   . CAD Father   . CAD Sister  SOCIAL HISTORY:  reports that he has never smoked. He does not have any smokeless tobacco history on file. He reports that he drinks alcohol. He reports that he does not use illicit drugs.  REVIEW OF SYSTEMS:   Constitutional: Negative for fever, chills, weight loss, malaise/fatigue and diaphoresis.  HENT: Negative for hearing loss, ear pain, nosebleeds, congestion, sore throat, neck pain, tinnitus  and ear discharge.   Eyes: Negative for blurred vision, double vision, photophobia, pain, discharge and redness.  Respiratory: Negative for cough, hemoptysis, sputum production, intermittent shortness of breath, wheezing and stridor.   Cardiovascular: Negative for chest pain, palpitations, orthopnea, claudication, leg swelling and PND.  Gastrointestinal: Negative for heartburn, nausea, vomiting, abdominal pain, did have diarrhea, constipation, blood in stool and melena. did have nausea. All subsided.  Genitourinary: Negative for dysuria, urgency, frequency, hematuria and flank pain.  Musculoskeletal: Negative for myalgias, back pain, joint pain and falls.  Skin: Negative for itching and rash.  Neurological: Negative for dizziness, tingling, tremors, sensory change, speech change, focal weakness, seizures, loss of consciousness, weakness and headaches.  Endo/Heme/Allergies: Negative for environmental allergies and polydipsia. Does not bruise/bleed easily.  SUBJECTIVE:  Hungry and thirsty  VITAL SIGNS: Temp:  [97.3 F (36.3 C)-98.3 F (36.8 C)] 97.3 F (36.3 C) (01/26 1100) Pulse Rate:  [66-72] 72  (01/26 1100) Resp:  [16-26] 18  (01/26 1100) BP: (80-120)/(48-81) 110/81 mmHg (01/26 1100) SpO2:  [98 %-100 %] 100 % (01/26 1100)  PHYSICAL EXAMINATION: General:  Obese AAM, sp slurred but NAD Neuro:  Awake, f/c HEENT:  East , no JVD  Cardiovascular:  rrr Lungs:  CTA Abdomen:  Soft, non-tender  Musculoskeletal:  Intact  Skin: intact    Lab 02/21/12 0817 02/20/12 0640 02/19/12 2314 02/19/12 2308  NA 134* -- 140 135  K 5.5* -- 4.9 4.8  CL 100 -- 110 99  CO2 16* -- -- 16*  BUN 42* -- 16 15  CREATININE 5.57* 2.93* 2.40* --  GLUCOSE 104* -- 156* 156*    Lab 02/21/12 0530 02/20/12 0640 02/19/12 2314 02/19/12 2308  HGB 14.2 16.4 17.0 --  HCT 42.3 48.9 50.0 --  WBC 15.0* 11.2* -- 11.1*  PLT PLATELET CLUMPS NOTED ON SMEAR, COUNT APPEARS ADEQUATE PLATELET CLUMPS NOTED ON SMEAR, COUNT  APPEARS ADEQUATE -- 165   Ct Head Wo Contrast  02/19/2012  *RADIOLOGY REPORT*  Clinical Data: Code stroke.  CT HEAD WITHOUT CONTRAST  Technique:  Contiguous axial images were obtained from the base of the skull through the vertex without contrast.  Comparison: None.  Findings: There is no definite evidence of acute infarction, mass lesion, or intra- or extra-axial hemorrhage on CT.  Mild subcortical white matter hypoattenuation is noted at the high left frontoparietal region; this may reflect small vessel ischemic microangiopathy, or possibly an infarct of indeterminate age.  The posterior fossa, including the cerebellum, brainstem and fourth ventricle, is within normal limits.  The third and lateral ventricles, and basal ganglia are unremarkable in appearance.  No mass effect or midline shift is seen.  There is no evidence of fracture; visualized osseous structures are unremarkable in appearance.  The orbits are within normal limits. The paranasal sinuses and mastoid air cells are well-aerated.  No significant soft tissue abnormalities are seen.  IMPRESSION:  1.  Mild subcortical white matter change at the high left frontoparietal region; this may reflect small vessel ischemic microangiopathy, or possibly a small infarct of indeterminate age. 2.  Otherwise unremarkable noncontrast CT of the head.  These  results were called by telephone on 02/19/2012 at 11:18 p.m. to Dr. Wallie Char, who verbally acknowledged these results.   Original Report Authenticated By: Santa Lighter, M.D.    Mri Brain Without Contrast  02/20/2012  *RADIOLOGY REPORT*  Clinical Data: 57 year old male with altered mental status. Confusion and slurred speech.  MRI HEAD WITHOUT CONTRAST  Technique:  Multiplanar, multiecho pulse sequences of the brain and surrounding structures were obtained according to standard protocol without intravenous contrast.  Comparison: Head CT without contrast 02/19/2012.  Findings: 10 mm focus of restricted  diffusion in the right cerebellum (series 3 image 5).  Smaller more linear focus of restricted effusion in the left cerebellar hemisphere (series 5 image 10).  Questionable asymmetric punctate focus in the lower brain stem on the left (series 5 image 13).  There is also evidence of a punctate focus restricted diffusion in the left external capsule anterior limb (series 5 image 28).  There might also be a nearby focus in the left caudate nucleus (series 5 image 30).  No other supratentorial diffusion abnormality.  Only subtle associated T2 hyperintensity at the larger cerebellar lesion.  No hemorrhage or mass effect. Major intracranial vascular flow voids are preserved.  Negative pituitary, cervicomedullary junction visualized cervical spine.  Normal bone marrow signal. Scattered subcortical cerebral white matter T2 and FLAIR hyperintensity not associated with the acute findings is nonspecific and mild to moderate for age.  There are increased dilated perivascular spaces in the cerebral hemispheres.  No ventriculomegaly.  Postoperative changes to the right globe. Visualized paranasal sinuses and mastoids are clear.   Negative scalp soft tissues.  IMPRESSION: 1.  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  No mass effect or hemorrhage.  Possible associated acute punctate left brain stem infarct. 2.  Questionable acute left external capsule and caudate nucleus punctate lacunar infarct. This could reflect synchronous small vessel ischemia, less likely sequelae of embolic phenomena given the different vascular territories.   Original Report Authenticated By: Roselyn Reef, M.D.    Dg Pelvis Portable  02/19/2012  *RADIOLOGY REPORT*  Clinical Data: Status post fall; concern for pelvic injury.  PORTABLE PELVIS  Comparison: None.  Findings: There is no evidence of fracture or dislocation.  Both femoral heads are seated normally within their respective acetabula.  Lumbar spinal fusion hardware is partially  imaged.  The sacroiliac joints are unremarkable in appearance.  The visualized bowel gas pattern is grossly unremarkable in appearance.  IMPRESSION: No evidence of fracture or dislocation.   Original Report Authenticated By: Santa Lighter, M.D.    Dg Chest Portable 1 View  02/19/2012  *RADIOLOGY REPORT*  Clinical Data: Code stroke.  Fall. Shortness of breath.  Weakness.  PORTABLE CHEST - 1 VIEW  Comparison: None.  Findings:  Cardiomegaly.  Prominence of the mediastinum may reflect portable technique and magnification although underlying mass or tortuous aorta not excluded.  Central pulmonary vascular prominence without frank pulmonary edema.  Left base subsegmental atelectasis.  No obvious fracture or gross pneumothorax.  The patient would eventually benefit from follow-up two-view chest with cardiac leads removed.  IMPRESSION: Cardiomegaly.  Prominence of the mediastinum may reflect portable technique and magnification although underlying mass or tortuous aorta not excluded.  Central pulmonary vascular prominence without frank pulmonary edema.  Left base subsegmental atelectasis.   Original Report Authenticated By: Genia Del, M.D.     ASSESSMENT / PLAN: 1) Cerebellar infarct: Neuro following. Concerned about embolic process.  2) Metabolic acidosis, + gap, non-gap acidosis: in  setting of AKI  3) Hyperkalemia: in setting of renal dysfxn 4) Acute renal failure: prob multifactorial, volume depletion, NSAIDs, possible worsened by resumption of clonidine, ace-I and diuretics which he had been off prior to admit. Also consider embolic phenomenon.  5) Transaminatis: Has h/o hep C but other acute viral panel markers negative. GI thinks likely d/t ischemia/ shock liver. 6) Transient hypotension. Only one isolated reading in 80s since admit. Was being treated for hypertension prior to 1/26. Certainly could have been hypotensive prior to arrival. 7) Possible UTI.    Recommendations -cont IVF resuscitation,  given he has a gap/non-gap process will go ahead and start bicarb as his MIVFs (done).  -get renal US Agree w/ stopping all antihypertensives and diuretics.  -agree need TEE.  -asa per neuro -empiric rocephin reasonable.   Really nothing for [place] to add as he is hemodynamically stable and you have all the appropriate sub-specialties involved. We will fade to stand-bye status. If his hemodynamics worsen please feel free to call PRN. Thanks for this interesting consult.    Rush Farmer, M.D. Pulmonary and [place] Pager: [phone]  02/21/2012, 12:11 PM

## 2012-02-21 NOTE — Consult Note (Signed)
Reason for Consult: Altered Mental status/stroke Referring Physician: Felipe Drone  CC: Left sided numbness  History is obtained from:Patient, medical record.   HPI: [name] [name] is a 56 y.o. male with a history of schizpphrenia who was brought to the ER due to AMS. MRI revealed cerebellar infarcts. He states that he fell yesterday, but was not sure why, then noticed that he could not move his left side. This improved and he currently is only left with numbness of the right hand and left lower leg.   He states that yesterday, he did not feel confused, but rather was confused about what caused him to fall.   ROS: A 14 point ROS was performed and is negative except as noted in the HPI.  Past Medical History  Diagnosis Date  . Hypertension   . Schizophrenia   . Hepatitis C   . HIT (heparin-induced thrombocytopenia)   . H/O: alcohol abuse   . H/O cocaine abuse     Family History: No hx cva  Social History: Tob: denies  Exam: Current vital signs: BP 110/70  Pulse 68  Temp 97.6 F (36.4 C) (Oral)  Resp 18  Ht 6' (1.829 m)  Wt 112.038 kg (247 lb)  BMI 33.50 kg/m2  SpO2 100% Vital signs in last 24 hours: Temp:  [97.4 F (36.3 C)-98.3 F (36.8 C)] 97.6 F (36.4 C) (01/26 0600) Pulse Rate:  [66-70] 68  (01/26 0600) Resp:  [16-26] 18  (01/26 0600) BP: (80-120)/(48-80) 110/70 mmHg (01/26 0930) SpO2:  [98 %-100 %] 100 % (01/26 0200)  General: in bed, NAD CV: RRR Mental Status: Patient is awake, alert, oriented to person, place, month, year, and situation. world backwards = D-L-O-R-W but was given quickly No sign of aphasia Cranial Nerves: II: Visual Fields are full. Pupils are equal, round, and reactive to light.  Discs are difficult to visualize III,IV, VI: EOMI without ptosis or diploplia.  V: Facial sensation is symmetric to temperature VII: Facial movement is symmetric.  VIII: hearing is intact to voice X: Uvula elevates symmetrically XI: Shoulder shrug is  symmetric. XII: tongue is midline without atrophy or fasciculations.  Motor: Tone is normal. Bulk is normal. 5/5 strength was present in all four extremities.  Sensory: Sensation is reduced in the right hand and left leg below the knee.  Deep Tendon Reflexes: 1+ and symmetric in the biceps, tr at patellae Cerebellar: FNF with R > left horpizontal tremor consistent with dysmetria.  Gait: Not tested due to leg pain.   I have reviewed labs in epic and the results pertinent to this consultation are: Elevated WBC Reanl failure, elevated LFTs  I have reviewed the images obtained:MRI brain - bilateral cerebellar infarcts  Impression: 57 yo M with possibly embolic infarct in the cerebellum. I suspect that he had more territory initially compromised, but cleared most of it. His liver and renal failure happening concomitantly could suggest systemic embolic phenomena.  Given this, I feel a TEE may be reasonable.   Recommendations: 1. HgbA1c 5.8, fasting lipid panel 121 - will need treatment when acute issues are over, statin currently contraindicated.  2. PT consult, OT consult, Speech consult 3. Would pursue Trans-esophageal echocardiogram 5. Carotid dopplers 6. Prophylactic therapy-Antiplatelet med: Aspirin - dose 325mg  7. Risk factor modification 8. Telemetry monitoring 9. Frequent neuro checks  Roland Rack, MD [facility] [phone]  If 7pm- 7am, please page neurology on call at [phone].

## 2012-02-21 NOTE — Progress Notes (Signed)
Noted pt. with decreased BP readings since 2am; attempted to re-check BP manually and unable to hear it. BP re-checked by doppler with SBP of 96. Pt. Remains A&Ox4. Dr. Blenda Nicely paged. Orders received to bolus pt. Will closely monitor.

## 2012-02-21 NOTE — Consult Note (Signed)
[facility] Consultation  Referring Provider:   [facility] Primary Care Physician:  ? Primary Gastroenterologist:  unassigned     Reason for Consultation:   Elevated LFTS       HPI: [name] [name] is a 57 y.o. male admitted 1/25 with altered mental status. He has a history of schizophrenia, HCV, and polysubstance abuse. Transaminases found to be elevated in ED 1/24 with AST of 521, ALT 298. Today they are markedly elevated with AST 4594, ALT 1638. His creatinine was 2.34 two days ago, now at 5.57. Neurology evaluated and questions possible embolic infarct in cerebellum. Patient for TEE. Has been hypotensive while here.  Urine tox screen negative. Denies recent illicit drug use. No tylenol at home. Does take 5 motrin a day. Denies abdominal pain. Mainly complains of left hip pain.   Denies chronic GI problems. He used to have GERD symptoms but not any more. Denies bowel changes, blood in stool.   Denies [facility] of liver disease or colon cancer.    Past Medical History  Diagnosis Date  . Hypertension   . Schizophrenia   . Hepatitis C   . HIT (heparin-induced thrombocytopenia)   . H/O: alcohol abuse   . H/O cocaine abuse     Past Surgical History  Procedure Date  . Back surgery     Family History  Problem Relation Age of Onset  . CAD Mother   . CAD Father   . CAD Sister     History  Substance Use Topics  . Smoking status: Never Smoker   . Smokeless tobacco: Not on file  . Alcohol Use: Yes    Prior to Admission medications   Medication Sig Start Date End Date Taking? Authorizing Provider  cloNIDine (CATAPRES) 0.3 MG tablet Take 0.3 mg by mouth 2 (two) times daily.   Yes Historical Provider, MD  furosemide (LASIX) 20 MG tablet Take 20 mg by mouth daily.   Yes Historical Provider, MD  labetalol (NORMODYNE) 100 MG tablet Take 100 mg by mouth 2 (two) times daily.   Yes Historical Provider, MD  lisinopril (PRINIVIL,ZESTRIL) 20 MG tablet Take 20 mg by mouth  daily.   Yes Historical Provider, MD  traZODone (DESYREL) 100 MG tablet Take 100 mg by mouth at bedtime.   Yes Historical Provider, MD    Current Facility-Administered Medications  Medication Dose Route Frequency Provider Last Rate Last Dose  . aspirin tablet 325 mg  325 mg Oral Daily Orvan Falconer, MD   325 mg at 02/21/12 1037  . cefTRIAXone (ROCEPHIN) 1 g in dextrose 5 % 50 mL IVPB  1 g Intravenous Q24H Monika Salk, MD   1 g at 02/21/12 1036  . dextrose 5 %-0.9 % sodium chloride infusion   Intravenous Continuous Monika Salk, MD 150 mL/hr at 02/21/12 1045    . enoxaparin (LOVENOX) injection 40 mg  40 mg Subcutaneous Daily Orvan Falconer, MD   40 mg at 02/21/12 1037  . folic acid (FOLVITE) tablet 1 mg  1 mg Oral Daily Monika Salk, MD   1 mg at 02/21/12 1139  . LORazepam (ATIVAN) tablet 1 mg  1 mg Oral Q6H PRN Monika Salk, MD       Or  . LORazepam (ATIVAN) injection 1 mg  1 mg Intravenous Q6H PRN Monika Salk, MD      . multivitamin with minerals tablet 1 tablet  1 tablet Oral Daily Monika Salk, MD   1 tablet at 02/21/12 1139  .  oxyCODONE (Oxy IR/ROXICODONE) immediate release tablet 5 mg  5 mg Oral Q4H PRN Erline Hau, MD   5 mg at 02/21/12 0253  . thiamine (VITAMIN B-1) tablet 100 mg  100 mg Oral Daily Monika Salk, MD   100 mg at 02/21/12 1139   Or  . thiamine (B-1) injection 100 mg  100 mg Intravenous Daily Monika Salk, MD      . traZODone (DESYREL) tablet 100 mg  100 mg Oral QHS Orvan Falconer, MD   100 mg at 02/20/12 2115    Allergies as of 02/19/2012  . (No Known Allergies)     Review of Systems:    All systems reviewed and negative except where noted in HPI.   PHYSICAL EXAM: Vital signs in last 24 hours: Temp:  [97.3 F (36.3 C)-98.3 F (36.8 C)] 97.3 F (36.3 C) (01/26 1100) Pulse Rate:  [66-72] 72  (01/26 1100) Resp:  [16-20] 18  (01/26 1100) BP: (80-120)/(48-81) 110/81 mmHg (01/26 1100) SpO2:  [98 %-100 %] 100 % (01/26 1100) Last BM Date: 02/19/12 General:   Black male  in NAD Head:  Normocephalic and atraumatic. Eyes:   No icterus.   Conjunctiva pink. Ears:  Normal auditory acuity. Neck:  Supple; no masses felt Lungs:  Respirations even and unlabored. Lungs clear to auscultation bilaterally.   No wheezes, crackles, or rhonchi.  Heart:  Regular rate and rhythm no murmur Abdomen:  Soft, nondistended, mild to mod RUQ tenderness.  Normal bowel sounds. No appreciable masses or hepatomegaly.  Rectal:  Not performed.  Msk:  Symmetrical without gross deformities - no splinter hemorrhages, Janeway lesions noted Extremities:  Without edema. Neurologic:  Alert and  oriented x4;  grossly normal neurologically. Skin:  Intact without significant lesions or rashes. Cervical Nodes:  No significant cervical adenopathy. Psych:  Alert and cooperative. Normal affect.  LAB RESULTS:  Basename 02/21/12 0530 02/20/12 0640 02/19/12 2314 02/19/12 2308  WBC 15.0* 11.2* -- 11.1*  HGB 14.2 16.4 17.0 --  HCT 42.3 48.9 50.0 --  PLT PLATELET CLUMPS NOTED ON SMEAR, COUNT APPEARS ADEQUATE PLATELET CLUMPS NOTED ON SMEAR, COUNT APPEARS ADEQUATE -- 165   BMET  Basename 02/21/12 0817 02/20/12 0640 02/19/12 2314 02/19/12 2308  NA 134* -- 140 135  K 5.5* -- 4.9 4.8  CL 100 -- 110 99  CO2 16* -- -- 16*  GLUCOSE 104* -- 156* 156*  BUN 42* -- 16 15  CREATININE 5.57* 2.93* 2.40* --  CALCIUM 7.3* -- -- 9.2   LFT  Basename 02/21/12 0817  PROT 6.0  ALBUMIN 2.6*  AST 4594*  ALT 1638*  ALKPHOS 109  BILITOT 0.8  BILIDIR --  IBILI --   PT/INR  Basename 02/19/12 2308  LABPROT 15.0  INR 1.20    STUDIES: Ct Head Wo Contrast  02/19/2012  *RADIOLOGY REPORT*  Clinical Data: Code stroke.  CT HEAD WITHOUT CONTRAST  Technique:  Contiguous axial images were obtained from the base of the skull through the vertex without contrast.  Comparison: None.  Findings: There is no definite evidence of acute infarction, mass lesion, or intra- or extra-axial hemorrhage on CT.  Mild subcortical  white matter hypoattenuation is noted at the high left frontoparietal region; this may reflect small vessel ischemic microangiopathy, or possibly an infarct of indeterminate age.  The posterior fossa, including the cerebellum, brainstem and fourth ventricle, is within normal limits.  The third and lateral ventricles, and basal ganglia are unremarkable in appearance.  No mass  effect or midline shift is seen.  There is no evidence of fracture; visualized osseous structures are unremarkable in appearance.  The orbits are within normal limits. The paranasal sinuses and mastoid air cells are well-aerated.  No significant soft tissue abnormalities are seen.  IMPRESSION:  1.  Mild subcortical white matter change at the high left frontoparietal region; this may reflect small vessel ischemic microangiopathy, or possibly a small infarct of indeterminate age. 2.  Otherwise unremarkable noncontrast CT of the head.  These results were called by telephone on 02/19/2012 at 11:18 p.m. to Dr. Wallie Char, who verbally acknowledged these results.   Original Report Authenticated By: Santa Lighter, M.D.    Mri Brain Without Contrast  02/20/2012  *RADIOLOGY REPORT*  Clinical Data: 57 year old male with altered mental status. Confusion and slurred speech.  MRI HEAD WITHOUT CONTRAST  Technique:  Multiplanar, multiecho pulse sequences of the brain and surrounding structures were obtained according to standard protocol without intravenous contrast.  Comparison: Head CT without contrast 02/19/2012.  Findings: 10 mm focus of restricted diffusion in the right cerebellum (series 3 image 5).  Smaller more linear focus of restricted effusion in the left cerebellar hemisphere (series 5 image 10).  Questionable asymmetric punctate focus in the lower brain stem on the left (series 5 image 13).  There is also evidence of a punctate focus restricted diffusion in the left external capsule anterior limb (series 5 image 28).  There might also be a  nearby focus in the left caudate nucleus (series 5 image 30).  No other supratentorial diffusion abnormality.  Only subtle associated T2 hyperintensity at the larger cerebellar lesion.  No hemorrhage or mass effect. Major intracranial vascular flow voids are preserved.  Negative pituitary, cervicomedullary junction visualized cervical spine.  Normal bone marrow signal. Scattered subcortical cerebral white matter T2 and FLAIR hyperintensity not associated with the acute findings is nonspecific and mild to moderate for age.  There are increased dilated perivascular spaces in the cerebral hemispheres.  No ventriculomegaly.  Postoperative changes to the right globe. Visualized paranasal sinuses and mastoids are clear.   Negative scalp soft tissues.  IMPRESSION: 1.  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  No mass effect or hemorrhage.  Possible associated acute punctate left brain stem infarct. 2.  Questionable acute left external capsule and caudate nucleus punctate lacunar infarct. This could reflect synchronous small vessel ischemia, less likely sequelae of embolic phenomena given the different vascular territories.   Original Report Authenticated By: Roselyn Reef, M.D.    Dg Pelvis Portable  02/19/2012  *RADIOLOGY REPORT*  Clinical Data: Status post fall; concern for pelvic injury.  PORTABLE PELVIS  Comparison: None.  Findings: There is no evidence of fracture or dislocation.  Both femoral heads are seated normally within their respective acetabula.  Lumbar spinal fusion hardware is partially imaged.  The sacroiliac joints are unremarkable in appearance.  The visualized bowel gas pattern is grossly unremarkable in appearance.  IMPRESSION: No evidence of fracture or dislocation.   Original Report Authenticated By: Santa Lighter, M.D.    Dg Chest Portable 1 View  02/19/2012  *RADIOLOGY REPORT*  Clinical Data: Code stroke.  Fall. Shortness of breath.  Weakness.  PORTABLE CHEST - 1 VIEW   Comparison: None.  Findings:  Cardiomegaly.  Prominence of the mediastinum may reflect portable technique and magnification although underlying mass or tortuous aorta not excluded.  Central pulmonary vascular prominence without frank pulmonary edema.  Left base subsegmental atelectasis.  No obvious fracture or  gross pneumothorax.  The patient would eventually benefit from follow-up two-view chest with cardiac leads removed.  IMPRESSION: Cardiomegaly.  Prominence of the mediastinum may reflect portable technique and magnification although underlying mass or tortuous aorta not excluded.  Central pulmonary vascular prominence without frank pulmonary edema.  Left base subsegmental atelectasis.   Original Report Authenticated By: Genia Del, M.D.    CXR viewed - Gatha Mayer, MD, [facility]    PREVIOUS ENDOSCOPIES: none  IMPRESSION / PLAN: 1. Marked transaminitis. Transminases elevated in ED two days ago (AST 521, ALT 298) but now markedly elevated with AST 4594, ALT 1638. Typically elevations this high are from acute viral hepatitis, ischemia or drug induced. His acute viral hep panel is negative except for known HCV.  Urine tox negative. He doesn't take Tylenol. He has been taking 4-5 motrin a day but still suspect this is probably ischemic (shock liver), especially given concomitant decline in renal function. Will check LDH. Patient was found to be hypotensive this am. BP responded to fluids.. Patient looks okay, his mentation is okay. INR 1.2, glucose 104. Doubt hepatic infarct but It should be noted though that neurology evaluating for questionable infarct in cerebellum. TEE ordered. Am LFTs, INR ordered.  Ultrasound ordered by admitting team  2. HCV, untreated.   3. History of polysubstance abuse.   3. Acute renal failure (? Baseline kidney function). Neph evaluating. Patient for transfer to the unit.       Thanks   LOS: 2 days   Tye Savoy  02/21/2012, 12:17 PM   [facility]  Attending  I have also seen and assessed the patient and agree with the above note. This must be shock liver (and shock kidney). Supportive care appropriate as you are - await [place] and TEE. Do not see clinical signs of endocarditis - ? Other possible embolic phenomenon but doubt related to liver problems if so.  Will follow  Gatha Mayer, MD, [facility] [phone] (pager) 02/21/2012 1:57 PM

## 2012-02-21 NOTE — Progress Notes (Signed)
Called by lab with critical CMET results; This RN has not received report on the patient as of yet but lab was concerned that the results were so different that they recommended immediate recollection. STAT CMET entered. Unable to d/w current RN at this time. Await 2nd CMET results.

## 2012-02-21 NOTE — Progress Notes (Addendum)
[facility] PROGRESS NOTE  [name] [name] [id number] DOB: 10/19/1955 DOA: 02/19/2012 PCP: No primary provider on file.  Assessment/Plan: Principal Problem:  *Altered mental status Active Problems:  Schizophrenia  HTN (hypertension)  TIA (transient ischemic attack)    1. Acute CVA: Patient presented with altered mental status and  A fall. He also complains of numbness RUE. Head CT scan revealed mild subcortical white matter change at the high left frontoparietal region, while brain MRI showed small acute cerebellar infarcts right greater than left. No mass effect or hemorrhage. Possible associated acute punctate left brain stem infarct. Also, questionable acute left external capsule and caudate nucleus punctate lacunar infarct. Carotid doppler showed no evidence of hemodynamically significant internal carotid artery stenosis. Vertebral artery flow is antegrade. 2D Echocardiogram revealed normal LV cavity size, mild concentric hypertrophy, EF of 30% to 35% and possible hypokinesis of the midinferolateral myocardium. EEG is unremarkable. Patient has been commenced on low dose ASA and PT/OT consult, placed. Neurology consultation will be requested. 2. Dyslipidemia; Lipid profile shows TC 245, TG 345, HDL 55, LDL 121. Statin is contraindicated at this time, due to marked transaminitis.  3. HTN. BP was significantly low at 80/48-80/50 this AM. Lisinopril was discontinued on 02/20/12. Have discontinued Clonidine and Labetalol today, bolused 500 ml NS and started aggressive maintenance infusion of iv fluids. BP improved to 110/70. 4. ARF: Patient presented with a creatinine of 2.34, BUN 15. Today, creatinine has climbed to 5.57, and patient appears to have made no urine overnight. Have ordered foley catheter, strict i/o. As described above, ACE-i was discontinued on 02/20/12. Renal U/S is pending. Will consult nephrology. No baseline creatinine is available.  5. UTI: Positive urine sediment with  significant bacteriuria. Have sent off culture, and commenced iv Rocephin.  6. Transaminitis: Patient has a known history of hepatitis C, and at presentation, AST was 521, ALT 298, Alk Phos, 123 and T. Bil 0.5. Today, AST is 4594, ALT 1638, Alk Phos 109 and T. Bil; 0.8. Hepatitis A and B serologies are negative, Acetaminophen level is <15, UDS is negative, as is HIV test. Very concerning for acute hepatic failure, although etiology is unclear at this time, hypotension, superimposed on already markedly abnormal LFT, may be the culprit. Will consult GI.      Code Status: Full Code.  Family Communication:  Disposition Plan: To be determined. Will transfer to ICU, for higher level of care.    Brief narrative: [name] [name] is an 57 y.o. male with hx of Schizophrenia, HTN, Hep C, Hx of polysubstance abuse, brought to the ER with altered mental status. He was not able to give insightful history, so it was difficult to ascertain his illness. Information was obtained directly from the EDP, and medical record. He was having confusion and slurred speech. He didn't have any seizure, HA, stiffneck, fever or chills. He fell yesterday and complained of hurting his left hip. Evaluation in the ER showed a negative hip film, a head CT showed ischemic disease, with possible small infarct of undeterminable age. His EKG showed NSR without any acute ST-T changes. He has no fever and WBC of 11K. His Cr is 2.4, but his LFTs were very elevated. His CXR showed no definite infiltrate or vascular congestion. Hospitalist was asked to admit him for altered mental status, ?TIA.   Consultants:  N/A.   Procedures:  CXR.  Pelvic x-Ray.  Head Ct scan.  Brain MRI.   Antibiotics:  Rocephin 02/21/12>>>  HPI/Subjective: Has not passed urine overnight.  Objective: Vital signs in last 24 hours: Temp:  [97.1 F (36.2 C)-98.3 F (36.8 C)] 97.6 F (36.4 C) (01/26 0600) Pulse Rate:  [66-70] 68  (01/26 0600) Resp:   [16-26] 18  (01/26 0600) BP: (80-143)/(48-89) 110/70 mmHg (01/26 0930) SpO2:  [98 %-100 %] 100 % (01/26 0200) Weight change:  Last BM Date: 02/19/12  Intake/Output from previous day: 01/25 0701 - 01/26 0700 In: 1080 [P.O.:1080] Out: -      Physical Exam: General: Comfortable, alert, communicative, fully oriented, not short of breath at rest.  HEENT:  No clinical pallor, no jaundice, no conjunctival injection or discharge. NECK:  Supple, JVP not seen, no carotid bruits, no palpable lymphadenopathy, no palpable goiter. CHEST:  Clinically clear to auscultation, no wheezes, no crackles. HEART:  Sounds 1 and 2 heard, normal, regular, no murmurs. ABDOMEN:  Moderately obese, soft, mildly tender in RUQ, no palpable organomegaly, no palpable masses, normal bowel sounds. GENITALIA:  Not examined. LOWER EXTREMITIES:  No pitting edema, palpable peripheral pulses. MUSCULOSKELETAL SYSTEM:  Unremarkable. CENTRAL NERVOUS SYSTEM:  No focal neurologic deficit on gross examination.  Lab Results:  Basename 02/21/12 0530 02/20/12 0640  WBC 15.0* 11.2*  HGB 14.2 16.4  HCT 42.3 48.9  PLT PLATELET CLUMPS NOTED ON SMEAR, COUNT APPEARS ADEQUATE PLATELET CLUMPS NOTED ON SMEAR, COUNT APPEARS ADEQUATE    Basename 02/21/12 0817 02/20/12 0640 02/19/12 2314 02/19/12 2308  NA 134* -- 140 --  K 5.5* -- 4.9 --  CL 100 -- 110 --  CO2 16* -- -- 16*  GLUCOSE 104* -- 156* --  BUN 42* -- 16 --  CREATININE 5.57* 2.93* -- --  CALCIUM 7.3* -- -- 9.2   No results found for this or any previous visit (from the past 240 hour(s)).   Studies/Results: Ct Head Wo Contrast  02/19/2012  *RADIOLOGY REPORT*  Clinical Data: Code stroke.  CT HEAD WITHOUT CONTRAST  Technique:  Contiguous axial images were obtained from the base of the skull through the vertex without contrast.  Comparison: None.  Findings: There is no definite evidence of acute infarction, mass lesion, or intra- or extra-axial hemorrhage on CT.  Mild  subcortical white matter hypoattenuation is noted at the high left frontoparietal region; this may reflect small vessel ischemic microangiopathy, or possibly an infarct of indeterminate age.  The posterior fossa, including the cerebellum, brainstem and fourth ventricle, is within normal limits.  The third and lateral ventricles, and basal ganglia are unremarkable in appearance.  No mass effect or midline shift is seen.  There is no evidence of fracture; visualized osseous structures are unremarkable in appearance.  The orbits are within normal limits. The paranasal sinuses and mastoid air cells are well-aerated.  No significant soft tissue abnormalities are seen.  IMPRESSION:  1.  Mild subcortical white matter change at the high left frontoparietal region; this may reflect small vessel ischemic microangiopathy, or possibly a small infarct of indeterminate age. 2.  Otherwise unremarkable noncontrast CT of the head.  These results were called by telephone on 02/19/2012 at 11:18 p.m. to Dr. Wallie Char, who verbally acknowledged these results.   Original Report Authenticated By: Santa Lighter, M.D.    Mri Brain Without Contrast  02/20/2012  *RADIOLOGY REPORT*  Clinical Data: 57 year old male with altered mental status. Confusion and slurred speech.  MRI HEAD WITHOUT CONTRAST  Technique:  Multiplanar, multiecho pulse sequences of the brain and surrounding structures were obtained according to standard protocol without intravenous contrast.  Comparison: Head CT without contrast 02/19/2012.  Findings: 10 mm focus of restricted diffusion in the right cerebellum (series 3 image 5).  Smaller more linear focus of restricted effusion in the left cerebellar hemisphere (series 5 image 10).  Questionable asymmetric punctate focus in the lower brain stem on the left (series 5 image 13).  There is also evidence of a punctate focus restricted diffusion in the left external capsule anterior limb (series 5 image 28).  There  might also be a nearby focus in the left caudate nucleus (series 5 image 30).  No other supratentorial diffusion abnormality.  Only subtle associated T2 hyperintensity at the larger cerebellar lesion.  No hemorrhage or mass effect. Major intracranial vascular flow voids are preserved.  Negative pituitary, cervicomedullary junction visualized cervical spine.  Normal bone marrow signal. Scattered subcortical cerebral white matter T2 and FLAIR hyperintensity not associated with the acute findings is nonspecific and mild to moderate for age.  There are increased dilated perivascular spaces in the cerebral hemispheres.  No ventriculomegaly.  Postoperative changes to the right globe. Visualized paranasal sinuses and mastoids are clear.   Negative scalp soft tissues.  IMPRESSION: 1.  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  No mass effect or hemorrhage.  Possible associated acute punctate left brain stem infarct. 2.  Questionable acute left external capsule and caudate nucleus punctate lacunar infarct. This could reflect synchronous small vessel ischemia, less likely sequelae of embolic phenomena given the different vascular territories.   Original Report Authenticated By: Roselyn Reef, M.D.    Dg Pelvis Portable  02/19/2012  *RADIOLOGY REPORT*  Clinical Data: Status post fall; concern for pelvic injury.  PORTABLE PELVIS  Comparison: None.  Findings: There is no evidence of fracture or dislocation.  Both femoral heads are seated normally within their respective acetabula.  Lumbar spinal fusion hardware is partially imaged.  The sacroiliac joints are unremarkable in appearance.  The visualized bowel gas pattern is grossly unremarkable in appearance.  IMPRESSION: No evidence of fracture or dislocation.   Original Report Authenticated By: Santa Lighter, M.D.    Dg Chest Portable 1 View  02/19/2012  *RADIOLOGY REPORT*  Clinical Data: Code stroke.  Fall. Shortness of breath.  Weakness.  PORTABLE CHEST - 1  VIEW  Comparison: None.  Findings:  Cardiomegaly.  Prominence of the mediastinum may reflect portable technique and magnification although underlying mass or tortuous aorta not excluded.  Central pulmonary vascular prominence without frank pulmonary edema.  Left base subsegmental atelectasis.  No obvious fracture or gross pneumothorax.  The patient would eventually benefit from follow-up two-view chest with cardiac leads removed.  IMPRESSION: Cardiomegaly.  Prominence of the mediastinum may reflect portable technique and magnification although underlying mass or tortuous aorta not excluded.  Central pulmonary vascular prominence without frank pulmonary edema.  Left base subsegmental atelectasis.   Original Report Authenticated By: Genia Del, M.D.     Medications: Scheduled Meds:   . aspirin  325 mg Oral Daily  . cefTRIAXone (ROCEPHIN)  IV  1 g Intravenous Q24H  . enoxaparin (LOVENOX) injection  40 mg Subcutaneous Daily  . traZODone  100 mg Oral QHS   Continuous Infusions:   . sodium chloride 150 mL/hr at 02/21/12 0932   PRN Meds:.oxyCODONE    LOS: 2 days   [facility] Pager [phone]. If 8PM-8AM, please contact night-coverage at [email or web address], password [email or web address] 02/21/2012, 9:53 AM  LOS: 2 days    I was asked to do TEE due to stroke to evaluate for cardiac source of cerebral embolism.  I have answered all questions to the patient and he is willing to proceed. He understands <1% risk of esophageal perforation, aspiration pneumnia, trauma to the throat, bleeding but not limited to these.

## 2012-02-22 DIAGNOSIS — D696 Thrombocytopenia, unspecified: Secondary | ICD-10-CM

## 2012-02-22 DIAGNOSIS — I509 Heart failure, unspecified: Secondary | ICD-10-CM

## 2012-02-22 DIAGNOSIS — M6282 Rhabdomyolysis: Secondary | ICD-10-CM

## 2012-02-22 DIAGNOSIS — K7200 Acute and subacute hepatic failure without coma: Secondary | ICD-10-CM

## 2012-02-22 HISTORY — DX: Rhabdomyolysis: M62.82

## 2012-02-22 LAB — DIC (DISSEMINATED INTRAVASCULAR COAGULATION)PANEL
D-Dimer, Quant: 7.68 ug{FEU}/mL — ABNORMAL HIGH (ref 0.00–0.48)
Fibrinogen: 613 mg/dL — ABNORMAL HIGH (ref 204–475)
INR: 1.16 (ref 0.00–1.49)
Platelets: 77 K/uL — ABNORMAL LOW (ref 150–400)
Prothrombin Time: 14.6 s (ref 11.6–15.2)
Smear Review: NONE SEEN
aPTT: 35 s (ref 24–37)

## 2012-02-22 LAB — RENAL FUNCTION PANEL
Albumin: 2.4 g/dL — ABNORMAL LOW (ref 3.5–5.2)
BUN: 60 mg/dL — ABNORMAL HIGH (ref 6–23)
Calcium: 6.7 mg/dL — ABNORMAL LOW (ref 8.4–10.5)
Phosphorus: 7.1 mg/dL — ABNORMAL HIGH (ref 2.3–4.6)
Potassium: 5 mEq/L (ref 3.5–5.1)
Sodium: 131 mEq/L — ABNORMAL LOW (ref 135–145)

## 2012-02-22 LAB — COMPREHENSIVE METABOLIC PANEL
AST: 3409 U/L — ABNORMAL HIGH (ref 0–37)
Albumin: 2.4 g/dL — ABNORMAL LOW (ref 3.5–5.2)
BUN: 58 mg/dL — ABNORMAL HIGH (ref 6–23)
Calcium: 6.8 mg/dL — ABNORMAL LOW (ref 8.4–10.5)
Chloride: 96 mEq/L (ref 96–112)
Creatinine, Ser: 7.13 mg/dL — ABNORMAL HIGH (ref 0.50–1.35)
Total Bilirubin: 0.8 mg/dL (ref 0.3–1.2)
Total Protein: 6 g/dL (ref 6.0–8.3)

## 2012-02-22 LAB — BASIC METABOLIC PANEL WITH GFR
BUN: 64 mg/dL — ABNORMAL HIGH (ref 6–23)
CO2: 21 meq/L (ref 19–32)
Calcium: 6.8 mg/dL — ABNORMAL LOW (ref 8.4–10.5)
Chloride: 90 meq/L — ABNORMAL LOW (ref 96–112)
Creatinine, Ser: 8.16 mg/dL — ABNORMAL HIGH (ref 0.50–1.35)
GFR calc Af Amer: 7 mL/min — ABNORMAL LOW (ref >90)
GFR calc non Af Amer: 6 mL/min — ABNORMAL LOW (ref >90)
Glucose, Bld: 102 mg/dL — ABNORMAL HIGH (ref 70–99)
Potassium: 5.3 meq/L — ABNORMAL HIGH (ref 3.5–5.1)
Sodium: 129 meq/L — ABNORMAL LOW (ref 135–145)

## 2012-02-22 LAB — PROTIME-INR
INR: 1.16 (ref 0.00–1.49)
Prothrombin Time: 14.6 seconds (ref 11.6–15.2)

## 2012-02-22 LAB — CBC
HCT: 37.4 % — ABNORMAL LOW (ref 39.0–52.0)
MCH: 29.9 pg (ref 26.0–34.0)
MCHC: 33.4 g/dL (ref 30.0–36.0)
MCV: 89.5 fL (ref 78.0–100.0)
Platelets: 78 10*3/uL — ABNORMAL LOW (ref 150–400)
RDW: 14.6 % (ref 11.5–15.5)

## 2012-02-22 LAB — CK TOTAL AND CKMB (NOT AT ARMC)
CK, MB: 60.7 ng/mL (ref 0.3–4.0)
CK, MB: 51.6 ng/mL (ref 0.3–4.0)

## 2012-02-22 LAB — GLOMERULAR BASEMENT MEMBRANE ANTIBODIES: GBM Ab: <1 AU/mL (ref <20)

## 2012-02-22 LAB — GLUCOSE, CAPILLARY
Glucose-Capillary: 102 mg/dL — ABNORMAL HIGH (ref 70–99)
Glucose-Capillary: 102 mg/dL — ABNORMAL HIGH (ref 70–99)
Glucose-Capillary: 113 mg/dL — ABNORMAL HIGH (ref 70–99)
Glucose-Capillary: 127 mg/dL — ABNORMAL HIGH (ref 70–99)

## 2012-02-22 LAB — KAPPA/LAMBDA LIGHT CHAINS
Kappa free light chain: 8.81 mg/dL — ABNORMAL HIGH (ref 0.33–1.94)
Lambda free light chains: 5.59 mg/dL — ABNORMAL HIGH (ref 0.57–2.63)

## 2012-02-22 LAB — PROTEIN / CREATININE RATIO, URINE: Creatinine, Urine: 163.29 mg/dL

## 2012-02-22 LAB — URIC ACID: Uric Acid, Serum: 16.1 mg/dL — ABNORMAL HIGH (ref 4.0–7.8)

## 2012-02-22 LAB — URINE CULTURE: Culture: NO GROWTH

## 2012-02-22 LAB — ANA: Anti Nuclear Antibody(ANA): NEGATIVE

## 2012-02-22 LAB — MPO/PR-3 (ANCA) ANTIBODIES: Myeloperoxidase Abs: <1 AU/mL (ref <20)

## 2012-02-22 LAB — TSH: TSH: 3.16 u[IU]/mL (ref 0.350–4.500)

## 2012-02-22 MED ORDER — SODIUM CHLORIDE 0.9 % IV BOLUS (SEPSIS)
500.0000 mL | Freq: Once | INTRAVENOUS | Status: AC
  Administered 2012-02-22: 500 mL via INTRAVENOUS

## 2012-02-22 MED ORDER — MIDAZOLAM HCL 2 MG/2ML IJ SOLN
INTRAMUSCULAR | Status: AC
  Administered 2012-02-22: 09:00:00
  Filled 2012-02-22: qty 4

## 2012-02-22 MED ORDER — ONDANSETRON HCL 4 MG/2ML IJ SOLN
4.0000 mg | Freq: Three times a day (TID) | INTRAMUSCULAR | Status: DC | PRN
  Administered 2012-02-22 – 2012-02-26 (×6): 4 mg via INTRAVENOUS
  Filled 2012-02-22 (×8): qty 2

## 2012-02-22 MED ORDER — FUROSEMIDE 10 MG/ML IJ SOLN
80.0000 mg | Freq: Two times a day (BID) | INTRAMUSCULAR | Status: DC
  Administered 2012-02-22 – 2012-02-23 (×3): 80 mg via INTRAVENOUS
  Filled 2012-02-22: qty 4
  Filled 2012-02-22 (×3): qty 8
  Filled 2012-02-22: qty 4
  Filled 2012-02-22: qty 8

## 2012-02-22 MED ORDER — FENTANYL CITRATE 0.05 MG/ML IJ SOLN
INTRAMUSCULAR | Status: AC
  Administered 2012-02-22: 09:00:00
  Filled 2012-02-22: qty 4

## 2012-02-22 NOTE — Progress Notes (Signed)
[name] [name] Daily Rounding Note 02/22/2012, 9:04 AM  SUBJECTIVE:       Anorexia with mild nausea.   Complains of pain in left hip from when he fell.  Had BM this AM.    OBJECTIVE:         Vital signs in last 24 hours:    Temp:  [97 F (36.1 C)-98.8 F (37.1 C)] 97.9 F (36.6 C) (01/27 0757) Pulse Rate:  [69-72] 70  (01/27 0757) Resp:  [14-21] 14  (01/27 0757) BP: (110-124)/(68-87) 114/75 mmHg (01/27 0757) SpO2:  [98 %-100 %] 98 % (01/27 0757) Last BM Date: 02/21/12 General: pleasant, slightly uncomfortable, overweight AAM.  Heart: RRR Chest: clear B.  No cough or dyspnea Abdomen: obese, active BS, ND, NT.  Extremities: minor pedal edema, no pitting Neuro/Psych:  Pleasant, cooperative, oriented x 3. Moves all 4s. MS:  No bruising or deformity at hip.  Intake/Output from previous day: 01/26 0701 - 01/27 0700 In: 250 [I.V.:250] Out: 600 [Urine:600]  Intake/Output this shift: Total I/O In: -  Out: 50 [Urine:50]  Lab Results:  Basename 02/22/12 0455 02/21/12 0530 02/20/12 0640  WBC 8.5 15.0* 11.2*  HGB 12.5* 14.2 16.4  HCT 37.4* 42.3 48.9  PLT 78* PLATELET CLUMPS NOTED ON SMEAR, COUNT APPEARS ADEQUATE PLATELET CLUMPS NOTED ON SMEAR, COUNT APPEARS ADEQUATE   BMET  Basename 02/22/12 0455 02/21/12 0817 02/20/12 0640 02/19/12 2314 02/19/12 2308  NA 133* 134* -- 140 --  K 5.2* 5.5* -- 4.9 --  CL 96 100 -- 110 --  CO2 20 16* -- -- 16*  GLUCOSE 111* 104* -- 156* --  BUN 58* 42* -- 16 --  CREATININE 7.13* 5.57* 2.93* -- --  CALCIUM 6.8* 7.3* -- -- 9.2   LFT  Basename 02/22/12 0455 02/21/12 0817 02/19/12 2308  PROT 6.0 6.0 7.8  ALBUMIN 2.4* 2.6* 3.6  AST 3409* 4594* 521*  ALT 1529* 1638* 298*  ALKPHOS 107 109 123*  BILITOT 0.8 0.8 0.5  BILIDIR -- -- --  IBILI -- -- --   PT/INR  Basename 02/22/12 0455 02/19/12 2308  LABPROT 14.6 15.0  INR 1.16 1.20   Hepatitis Panel  Basename 02/20/12 0759  HEPBSAG NEGATIVE  HCVAB Reactive*  HEPAIGM NEGATIVE    HEPBIGM NEGATIVE    Studies/Results: Mri Brain Without Contrast 02/20/2012    IMPRESSION: 1.  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  No mass effect or hemorrhage.  Possible associated acute punctate left brain stem infarct. 2.  Questionable acute left external capsule and caudate nucleus punctate lacunar infarct. This could reflect synchronous small vessel ischemia, less likely sequelae of embolic phenomena given the different vascular territories.   Original Report Authenticated By: Roselyn Reef, M.D.    US Abdomen Complete 02/21/2012    IMPRESSION: Dense slightly heterogeneous liver with morphologic suggestive of cirrhosis.  Mild CBD dilatation - 11 mm.  Consider further evaluation.   Original Report Authenticated By: Margarette Canada, M.D.     ASSESMENT: *  Shock liver.  LFTs improving.  *  HCV, untreated.  ultrasound appearance of liver (nodular) now suggesting cirrhosis. CBD is 11 MM.  *  Acute renal failure, worsening. With anion gap acidosis. Elevated CK indicitive of Rhabdo. Multiple labs pending to define etiology.  *  Hyperkalemia.  *  Thrombocytopenia.  *  Alcohol abuse, polysubstance abuse. .  *  EF 30 - 35%.  TEE this am at bedside.   PLAN: *  Await findings of  TEE *  Per Dr Hilarie Fredrickson.  *  Advanced to carb mod diet.    LOS: 3 days   Azucena Freed  02/22/2012, 9:04 AM Pager: [phone]

## 2012-02-22 NOTE — Progress Notes (Signed)
Stroke Team Progress Note  HISTORY [name] [name] is a 57 y.o. male with a history of schizpphrenia who was brought to the ER 02/19/2012 due to AMS. MRI revealed cerebellar infarcts. He states that he fell yesterday 02/18/2012, but was not sure why, then noticed that he could not move his left side. This improved and he currently is only left with numbness of the right hand and left lower leg. He states that yesterday, he did not feel confused, but rather was confused about what caused him to fall. Patient was not a TPA candidate secondary to delay in arrival. He was admitted for further evaluation and treatment.  SUBJECTIVE His RN is at the bedside.  He just completed his TEE at the bedside. Dr. Einar [name] leaving room, discussed with Dr. Leonie [name]. Overall he feels his condition is stable. Pt lived alone prior to admission.   OBJECTIVE Most recent Vital Signs: Filed Vitals:   02/21/12 1950 02/21/12 2354 02/22/12 0347 02/22/12 0757  BP: 124/75 123/87 116/74 114/75  Pulse: 72 72 70 70  Temp: 98.3 F (36.8 C) 98.8 F (37.1 C) 98.6 F (37 C)   TempSrc: Oral Oral Oral   Resp: 20 19 19 14   Height:      Weight:      SpO2: 99% 99% 99% 98%   CBG (last 3)   Basename 02/22/12 0730 02/22/12 0345 02/21/12 2349  GLUCAP 113* 112* 102*   IV Fluid Intake:     .  sodium bicarbonate  infusion 1000 mL 125 mL/hr at 02/22/12 0800   MEDICATIONS    . aspirin  325 mg Oral Daily  . cefTRIAXone (ROCEPHIN)  IV  1 g Intravenous Q24H  . enoxaparin (LOVENOX) injection  30 mg Subcutaneous Q24H  . folic acid  1 mg Oral Daily  . multivitamin with minerals  1 tablet Oral Daily  . thiamine  100 mg Oral Daily   Or  . thiamine  100 mg Intravenous Daily  . traZODone  100 mg Oral QHS   PRN:  LORazepam, LORazepam, ondansetron (ZOFRAN) IV, oxyCODONE  Diet:  NPO  Activity:   Bathroom privileges with assistance DVT Prophylaxis:  Lovenox 30 mg sq daily   CLINICALLY SIGNIFICANT STUDIES Basic Metabolic Panel:  Lab  0000000 0455 02/21/12 0817  NA 133* 134*  K 5.2* 5.5*  CL 96 100  CO2 20 16*  GLUCOSE 111* 104*  BUN 58* 42*  CREATININE 7.13* 5.57*  CALCIUM 6.8* 7.3*  MG -- --  PHOS -- --   Liver Function Tests:  Lab 02/22/12 0455 02/21/12 0817  AST 3409* 4594*  ALT 1529* 1638*  ALKPHOS 107 109  BILITOT 0.8 0.8  PROT 6.0 6.0  ALBUMIN 2.4* 2.6*   CBC:  Lab 02/22/12 0455 02/21/12 0530 02/19/12 2308  WBC 8.5 15.0* --  NEUTROABS -- -- 9.6*  HGB 12.5* 14.2 --  HCT 37.4* 42.3 --  MCV 89.5 91.2 --  PLT 78* PLATELET CLUMPS NOTED ON SMEAR, COUNT APPEARS ADEQUATE --   Coagulation:  Lab 02/22/12 0455 02/19/12 2308  LABPROT 14.6 15.0  INR 1.16 1.20   Cardiac Enzymes:  Lab 02/21/12 1521 02/19/12 2309  CKTOTAL >50000* --  CKMB -- --  CKMBINDEX -- --  TROPONINI -- <0.30   Urinalysis:  Lab 02/20/12 0104  COLORURINE AMBER*  LABSPEC 1.021  PHURINE 5.0  GLUCOSEU NEGATIVE  HGBUR LARGE*  BILIRUBINUR NEGATIVE  KETONESUR NEGATIVE  PROTEINUR >300*  UROBILINOGEN 0.2  NITRITE NEGATIVE  LEUKOCYTESUR TRACE*   Lipid Panel  Component Value Date/Time   CHOL 245* 02/20/2012 0640   TRIG 345* 02/20/2012 0640   HDL 55 02/20/2012 0640   CHOLHDL 4.5 02/20/2012 0640   VLDL 69* 02/20/2012 0640   LDLCALC 121* 02/20/2012 0640   HgbA1C  Lab Results  Component Value Date   HGBA1C 5.8* 02/20/2012    Urine Drug Screen:     Component Value Date/Time   LABOPIA NONE DETECTED 02/20/2012 0105   COCAINSCRNUR NONE DETECTED 02/20/2012 0105   LABBENZ NONE DETECTED 02/20/2012 0105   AMPHETMU NONE DETECTED 02/20/2012 0105   THCU NONE DETECTED 02/20/2012 0105   LABBARB NONE DETECTED 02/20/2012 0105    Alcohol Level:  Lab 02/19/12 2321  ETH 105*   CT of the brain    MRI of the brain  02/20/2012  1.  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  No mass effect or hemorrhage.  Possible associated acute punctate left brain stem infarct. 2.  Questionable acute left external capsule and caudate nucleus  punctate lacunar infarct. This could reflect synchronous small vessel ischemia, less likely sequelae of embolic phenomena given the different vascular territories.   MRA of the brain    2D Echocardiogram  EF 30-35% with no source of embolus. Possible hypokinesis of the midinferolateral myocardium.  TEE PFO with right to left shunt, EF 30%, pulmonary hypertension  Carotid Doppler    CXR    EKG  normal sinus rhythm.   US Abdomen 02/21/2012   Dense slightly heterogeneous liver with morphologic suggestive of cirrhosis.  Mild CBD dilatation - 11 mm.  Consider further evaluation.  Therapy Recommendations   Physical Exam   General: in bed, NAD  CV: RRR  Mental Status:  Patient is awake, alert, oriented to person, place, month, year, and situation.  No sign of aphasia  Cranial Nerves:  II: Visual Fields are full. Pupils are equal, round, and reactive to light. Discs are difficult to visualize  III,IV, VI: EOMI without ptosis or diploplia. or nystagmus V: Facial sensation is symmetric to temperature  VII: Facial movement is symmetric.  VIII: hearing is intact to voice  X: Uvula elevates symmetrically  XI: Shoulder shrug is symmetric.  XII: tongue is midline without atrophy or fasciculations.  Motor:  Tone is normal. Bulk is normal. 5/5 strength was present in all four extremities.  Sensory:  Sensation is reduced in the right hand and left leg below the knee.  Deep Tendon Reflexes:  1+ and symmetric in the biceps, trace at patellae  Cerebellar:  FNF impaired on right consistent with dysmetria.  Gait: Not tested due to leg pain  ASSESSMENT [name] is a 57 y.o. male presenting with a fall of unknown etiology . Imaging confirms a right greater than left cerebellar infarcts. Infarcts felt to be  embolic secondary to unknown etiology. TEE just completed which shows PFO. Could be related if patient has LE VTE. On no antiplatelet prior to admission. Now on aspirin 325 mg orally  every day for secondary stroke prevention. Patient with resultant left hemisensory deficit and ataxia.  PFO (patent foramen ovale) likely an incidental finding.  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 Will check LE venous dopplers for DVT as possible cause of stroke.  Check TCD to look at vasculature  Add statin to met JC core measure of LDL < 100. If not desired, document reason why statin not ordered.  Therapy evals  Burnetta Sabin, MSN, RN, ANVP-BC, ANP-BC, GNP-BC [facility] Pager: [phone] 02/22/2012 8:28 AM  I have personally obtained a history, examined the patient, evaluated imaging results, and formulated the assessment and plan of care. I agree with the above.  Antony Contras, MD Medical Director [facility] Pager: [phone] 02/22/2012 2:06 PM

## 2012-02-22 NOTE — Progress Notes (Signed)
   Subjective:    Denies dyspnea.  Objective:    Filed Vitals:   02/22/12 1655 02/22/12 1700 02/22/12 1800 02/22/12 2051  BP:    136/87  Pulse: 85 77    Temp:    98.6 F (37 C)  TempSrc:    Oral  Resp:  25 28   Height:      Weight:      SpO2: 92% 95%        Physical Exam: GENERAL: alert and oriented; resting comfortably in bed and in no distress LUNGS: clear to auscultation bilaterally, normal work of breathing    Labs: Basic Metabolic Panel:  Lab 0000000 1424 02/22/12 1141 02/22/12 0455 02/21/12 0817 02/20/12 0640 02/19/12 2314 02/19/12 2308  NA 131* -- 133* 134* -- 140 135  K 5.0 -- 5.2* 5.5* -- 4.9 4.8  CL 92* -- 96 100 -- 110 99  CO2 23 -- 20 16* -- -- 16*  GLUCOSE 108* -- 111* 104* -- 156* 156*  BUN 60* -- 58* 42* -- 16 15  CREATININE 7.85* -- 7.13* 5.57* 2.93* 2.40* --  CALCIUM 6.7* -- 6.8* 7.3* -- -- --  MG -- -- -- -- -- -- --  PHOS 7.1* 7.1* -- -- -- -- --    Cardiac Enzymes:  Lab 02/22/12 2026 02/22/12 1424 02/22/12 0937 02/21/12 1521 02/19/12 2309  CKTOTAL >50000* >50000* >50000* >50000* --  CKMB 42.5* 51.6* 60.7* -- --  CKMBINDEX -- -- -- -- --  TROPONINI 0.54* 0.52* 0.42* -- <0.30      Assessment/ Plan:    Respiratory status stable.  NO rales on exam.  Will bolus 500 mL.    Length of Stay: 3 days   Signed by:  Shann Medal. Juleen China, MD PGY-I, Internal Medicine Pager [phone]  02/22/2012, 10:37 PM

## 2012-02-22 NOTE — Progress Notes (Signed)
Utilization review completed.  

## 2012-02-22 NOTE — Evaluation (Signed)
Clinical/Bedside Swallow Evaluation Patient Details  Name: [name] [name] MRN: [id number] Date of Birth: 05/10/1955  Today's Date: 02/22/2012 Time: 1546-     Past Medical History:  Past Medical History  Diagnosis Date  . Hypertension   . Schizophrenia   . Hepatitis C   . HIT (heparin-induced thrombocytopenia)   . H/O: alcohol abuse   . H/O cocaine abuse    Past Surgical History:  Past Surgical History  Procedure Date  . Back surgery    HPI:  [name] [name] is an 57 y.o. male with hx of Schizophrenia, HTN, Hep C, Hx of polysubstance abuse, brought to the ER with altered mental status.  He was not able to give insightful history, so it was difficult to ascertain his illness.  Information was obtained directly from the EDP, and medical record.  He was having confusion and slurred speech.  He didn't have any seizure, HA, stiffneck, fever or chills.  He fell yesterday and complained of hurting his left hip.  Evaluation in the ER showed a negative hip film, a head CT showed ischemic disease, with possible small infarct of undeterminable age.  His EKG showed NSR without any acute ST-T changes. He has no fever and WBC of 11K.  His Cr is 2.4, but his LFTs were very elevated. His CXR showed no definite infiltrate or vascular congestion.  Hospitalist was asked to admit him for altered mental status,  MRI:  Small acute cerebellar infarcts right greater than left.  No.   Assessment / Plan / Recommendation Clinical Impression  Patient appears to be swallowing with no overt s/s of aspiration.. Patient was able to chew solids and swallow without delay, timely swallow initiation with thin liquids, purees, and solids.  One time delayed cough was noted after crackers, but  did not appear to be related to swallowing.  Voice remained clear after swallows.    Aspiration Risk  Mild    Diet Recommendation Regular;Thin liquid   Liquid Administration via: Straw;Cup Medication Administration: Whole meds  with liquid Supervision: Intermittent supervision to cue for compensatory strategies Compensations: Slow rate;Small sips/bites Postural Changes and/or Swallow Maneuvers: Seated upright 90 degrees    Other  Recommendations Oral Care Recommendations: Oral care QID   Follow Up Recommendations  None    Frequency and Duration min 1 x/week  1 week   Pertinent Vitals/Pain Pain in hip "6"  RN aware.  Repositioned patient.    SLP Swallow Goals Patient will consume recommended diet without observed clinical signs of aspiration with: Set-up;Modified independent assistance   Swallow Study Prior Functional Status       General HPI: [name] [name] is an 57 y.o. male with hx of Schizophrenia, HTN, Hep C, Hx of polysubstance abuse, brought to the ER with altered mental status.  He was not able to give insightful history, so it was difficult to ascertain his illness.  Information was obtained directly from the EDP, and medical record.  He was having confusion and slurred speech.  He didn't have any seizure, HA, stiffneck, fever or chills.  He fell yesterday and complained of hurting his left hip.  Evaluation in the ER showed a negative hip film, a head CT showed ischemic disease, with possible small infarct of undeterminable age.  His EKG showed NSR without any acute ST-T changes. He has no fever and WBC of 11K.  His Cr is 2.4, but his LFTs were very elevated. His CXR showed no definite infiltrate or vascular congestion.  Hospitalist was asked to  admit him for altered mental status,  MRI:  Small acute cerebellar infarcts right greater than left.  No. Type of Study: Bedside swallow evaluation Previous Swallow Assessment: None Diet Prior to this Study: Regular;Thin liquids Temperature Spikes Noted: No Respiratory Status: Supplemental O2 delivered via (comment) History of Recent Intubation: No Behavior/Cognition: Alert;Cooperative Oral Cavity - Dentition: Dentures, top Self-Feeding Abilities: Able to  feed self;Needs set up (Right handed and c/o right arm/hand  weakness and numbness) Patient Positioning: Upright in bed Baseline Vocal Quality: Clear Volitional Cough: Strong Volitional Swallow: Able to elicit    Oral/Motor/Sensory Function Overall Oral Motor/Sensory Function: Impaired Labial ROM: Within Functional Limits Labial Symmetry: Within Functional Limits Labial Strength: Within Functional Limits Labial Sensation: Within Functional Limits Lingual ROM: Reduced right Lingual Symmetry: Abnormal symmetry right Lingual Strength: Reduced Lingual Sensation: Reduced Facial Symmetry: Within Functional Limits Facial Strength: Within Functional Limits Velum: Within Functional Limits Mandible: Within Functional Limits   Ice Chips Ice chips: Not tested   Thin Liquid Thin Liquid: Within functional limits Presentation: Cup;Straw;Self Fed    Nectar Thick Nectar Thick Liquid: Not tested   Honey Thick Honey Thick Liquid: Not tested   Puree Puree: Within functional limits Presentation: Spoon;Self Fed   Solid   GO    Solid: Within functional limits Presentation: Self Loyal Buba, Sundy Houchins T 02/22/2012,4:19 PM

## 2012-02-22 NOTE — Progress Notes (Signed)
[facility]  Subjective:  Some nausea. No vomiting. Poor appetite.  Weakness in left leg still present.    Objective: Vital signs in last 24 hours: Blood pressure 114/75, pulse 70, temperature 97.9 F (36.6 C), temperature source Oral, resp. rate 14, height 6' (1.829 m), weight 247 lb (112.038 kg), SpO2 98.00%.    PHYSICAL EXAM Gen: NAD, laying in bed comfortably.  Chest: Clear breath sounds. No crackles Cardio: regular rate and rhythm, S1, S2 normal, no murmur GI: soft, non-tender; bowel sounds normal; distended  Extremities: no edema Neurologic: No focal deficit  Lab Results:   Lab 02/22/12 0455 02/21/12 0817 02/20/12 0640 02/19/12 2314 02/19/12 2308  NA 133* 134* -- 140 --  K 5.2* 5.5* -- 4.9 --  CL 96 100 -- 110 --  CO2 20 16* -- -- 16*  BUN 58* 42* -- 16 --  CREATININE 7.13* 5.57* 2.93* -- --  ALB -- -- -- -- --  GLUCOSE 111* -- -- -- --  CALCIUM 6.8* 7.3* -- -- 9.2  PHOS -- -- -- -- --     Basename 02/22/12 0455 02/21/12 0530  WBC 8.5 15.0*  HGB 12.5* 14.2  HCT 37.4* 42.3  PLT 78* PLATELET CLUMPS NOTED ON SMEAR, COUNT APPEARS ADEQUATE     Scheduled Meds:   . aspirin  325 mg Oral Daily  . cefTRIAXone (ROCEPHIN)  IV  1 g Intravenous Q24H  . enoxaparin (LOVENOX) injection  30 mg Subcutaneous Q24H  . folic acid  1 mg Oral Daily  . multivitamin with minerals  1 tablet Oral Daily  . thiamine  100 mg Oral Daily   Or  . thiamine  100 mg Intravenous Daily  . traZODone  100 mg Oral QHS   Continuous Infusions:   .  sodium bicarbonate  infusion 1000 mL 125 mL/hr at 02/22/12 0800   PRN Meds:.LORazepam, LORazepam, ondansetron (ZOFRAN) IV, oxyCODONE  Assessment/Plan: Pt is a 57 y.o. yo male with a PMHx of schizophrenia, HTN, Hep C, h/o polysubstance abuse who was admitted to [facility] on 02/19/2012 for evaluation of altered mental status with confusion and slurred speech, found to have cerebellar stroke on MRI   # Acute renal failure: Creatinine on  admission: 2.34 to 7.13 today. Baseline Cr: 1.2 on 02/08/12.  Poor urine output.  Urine shows granular casts as well as a couple red cell casts. Differential therefore includes:  ATN from combination of hypotension, poor po intake, ACEI and NSAIDs; vs AIN in context of NSAID use; vs Glomerular etiology (protein and red cell casts in urine), or possibly a combination. Serologies pending - abdominal US: no hydronephrosis.  - Pr/Cr: elevated at 0.80.  - CK >50,000 suggesting rhabdomyolysis. Start aggressive fluid replacement therapy if patient can tolerate it with EF: 30-35%/  Will increase bicarb drip and give lasix to help manage - FeNa pending  - urine eosinophils to rule out AIN: pending - UPEP: pending - ANA, ANCA, GBM, ASA, C3, C4, cryoglobulin for GN rule out: pending - Strict I's/Os  # Anion gap acidosis: likely from renal failure. Resolving on bicarb drip.  # Hypotension: home meds held. BP's stable -  transferred to step down unit for further monitoring  # CVA and altered mental status: followed by neurology. AMS improved.  # Transaminitis: GI consulted. Known hepatitis C. Hep A and B negative. Tylenol<15. Ethanol:105. Abdominal US pending.  # UTI: urine culture pending. On ceftriaxone  # Schizophrenia: per primary team  # Alcohol abuse: on CIWA with  thiamine, folic acid and multivit.  Liam Graham, PGY-2  Family Medicine Resident     LOS: 3 days   Liam Graham 02/22/2012,8:32 AM  Patient seen and examined, agree with above note with above modifications. Worsening ARF in the setting of multiple renal insults, most notably significant hypotension and CK greater than 50,000.  Will plan to use iv bicarb and lasix but dont have a good feeling that he will dodge dialysis.  No indications yet, will continue to follow Corliss Parish, MD 02/22/2012

## 2012-02-22 NOTE — Evaluation (Signed)
Physical Therapy Evaluation Patient Details Name: [name] [name] MRN: [id number] DOB: [birth year] Today's Date: 02/22/2012 Time: [id number] PT Time Calculation (min): 18 min  PT Assessment / Plan / Recommendation Clinical Impression  Patient is a 57 year old man with history of hypertension, hepatitis C, schizophrenia, alcohol abuse admitted 02/20/2012 with altered mental status and slurred speech.  He was found to have small acute cerebellar strokes by MRI of the brain.  He presents to PT with decreased balance, acute pain s/p fall prior to admission, decreased activity tolerance and will benefit from skilled PT to maximize independence and allow return home with intermittent assist.  May need inpatient rehab stay prior to d/c home depending on progress.    PT Assessment  Patient needs continued PT services    Follow Up Recommendations  CIR (depending on progress)    Does the patient have the potential to tolerate intense rehabilitation    Yes  Barriers to Discharge Decreased caregiver support Likely to have only intermittent assist available.  Patient unsure.    Equipment Recommendations  Rolling walker with 5" wheels (TBA)       Frequency Min 4X/week    Precautions / Restrictions Precautions Precautions: Fall   Pertinent Vitals/Pain 6/10 left hip      Mobility  Bed Mobility Details for Bed Mobility Assistance: patient up in chair Transfers Transfers: Sit to Stand;Stand to Sit Sit to Stand: 4: Min assist;With armrests;From chair/3-in-1;With upper extremity assist Stand to Sit: With armrests;With upper extremity assist;To chair/3-in-1;4: Min assist Details for Transfer Assistance: increased time required due to hip pain Ambulation/Gait Ambulation/Gait Assistance: Not tested (comment) (patient reports doesn't feel ready due to hip pain)           PT Diagnosis: Difficulty walking;Generalized weakness;Acute pain  PT Problem List: Decreased knowledge of use of  DME;Decreased activity tolerance;Decreased safety awareness;Decreased balance;Decreased mobility;Pain PT Treatment Interventions: DME instruction;Gait training;Functional mobility training;Patient/family education;Therapeutic activities;Therapeutic exercise;Balance training;Neuromuscular re-education   PT Goals Acute Rehab PT Goals PT Goal Formulation: With patient Time For Goal Achievement: 03/02/12 Potential to Achieve Goals: Good Pt will go Supine/Side to Sit: with supervision PT Goal: Supine/Side to Sit - Progress: Goal set today Pt will go Sit to Supine/Side: with min assist PT Goal: Sit to Supine/Side - Progress: Goal set today Pt will go Sit to Stand: with supervision PT Goal: Sit to Stand - Progress: Goal set today Pt will go Stand to Sit: with supervision PT Goal: Stand to Sit - Progress: Goal set today Pt will Stand: with supervision;3 - 5 min;with unilateral upper extremity support (during functional activity) PT Goal: Stand - Progress: Goal set today Pt will Ambulate: >150 feet;with least restrictive assistive device;with supervision PT Goal: Ambulate - Progress: Goal set today  Visit Information  Last PT Received On: 02/22/12 Assistance Needed: +1    Subjective Data  Subjective: I fell on my hip and haven't been the same since Patient Stated Goal: To return home.   Prior Functioning  Home Living Lives With: Alone Available Help at Discharge: Family;Friend(s) Type of Home: Apartment Home Access: Level entry Home Layout: One level Home Adaptive Equipment: None Additional Comments: states friend might be able to stay with him, not sure if 24 hour, sister checks in frequently when he is at home Prior Function Level of Independence: Independent Driving: No Communication Communication: No difficulties    Cognition  Overall Cognitive Status: Appears within functional limits for tasks assessed/performed Arousal/Alertness: Awake/alert Behavior During Session: [facility]  for tasks performed    Extremity/Trunk  Assessment Right Upper Extremity Assessment RUE Coordination: WFL - gross motor Left Upper Extremity Assessment LUE Coordination: WFL - gross motor Right Lower Extremity Assessment RLE ROM/Strength/Tone: WFL for tasks assessed RLE Coordination: WFL - gross motor Left Lower Extremity Assessment LLE ROM/Strength/Tone: WFL for tasks assessed LLE Coordination: WFL - gross motor   Balance Balance Balance Assessed: Yes Static Standing Balance Static Standing - Balance Support: Bilateral upper extremity supported Static Standing - Level of Assistance: 5: Stand by assistance Static Standing - Comment/# of Minutes: stood approx 1 minute decreased weight shift left, holding back of chair Dynamic Standing Balance Dynamic Standing - Balance Support: Left upper extremity supported;Right upper extremity supported Dynamic Standing - Level of Assistance: 4: Min assist Dynamic Standing - Balance Activities: Other (comment) (marching in place (decreased stance time left))  End of Session PT - End of Session Equipment Utilized During Treatment: Gait belt Activity Tolerance: Patient limited by pain Patient left: in chair;with call bell/phone within reach  GP     [facility] 02/22/2012, 5:20 PM  [place], [place] 02/22/2012

## 2012-02-22 NOTE — Progress Notes (Signed)
INTERNAL MEDICINE TRANSFER NOTE:  Interval summary: Mr. Mishoe is 57 y.o. male with a pmh of schizophrenia, HTN, Hep C, h/o polysubstance abuse (cocaine/EtOH) who was admitted to [facility] on 02/19/2012 for evaluation of altered mental status with confusion and slurred speech.   Patient presented with  altered mental status  and right upper extremity numbness. Head CT showed chronic ischemic white matter changes and MRI of brain showed multiple small acute cerebellar infarcts, right greater than left. Also concern for possible left brain stem infarct. He was started on low-dose aspirin. No significant internal carotid artery stenosis. Transthoracic echocardiogram was notable for ejection fraction of 30-35% with hypokinesis of the inferior lateral heart wall. He had a followup transesophageal echocardiogram this morning which demonstrated a PFO with strongly positive contrast study for right-to-left shunting. This points to paradoxical emboli as source of multiple infarctions.  Hospital course has also been complicated by rising transaminases as well as acute renal failure. Patient presented with AST 521 ALT 298 with unremarkable alkaline phosphatase and preserved synthetic function (normal INR and total bilirubin). On hospital day 3, AST/ALT peaked at  4600/1600.  UDS was negative. Denied tylenol use and negative acetaminophen level <15. Acute hepatitis serologies were negative (pt w chronic Hep C) and HIV Ab nonreactive. Seen by GI (Dr. Carlean Purl), who suspected ischemic liver as etiology of transaminitis, as patient had episode of hypotension 80s/50s on 02/21/12.   On the afternoon of hospital day 3, patient's CK returned as >50,000 (upper limit normal). Elevations in serum transaminases are common in rhabdomyolysis. Cause of rhabdomyolysis not completely clear, does have use of heavy chronic and binge drinking. UDS negative for cocaine. Today transaminases are downtrending with AST/ALT 3400/1500.  Patient also  presented with acute renal failure with Cr 2.3 on admission  (baseline 1.3). Urine demonstrated granular casts and a couple of RBC casts. Cr has increased to 7.1 today, consistent with ATN from heme pigment-induced injury. Patient has had poor UOP with 600 cc/24 hours.   Subjective: Patient lying in bed. Says that he feels much better today. Still with some tenderness in his belly.  Denies CP, SOB, N/V, diarrhea.   Objective: Vital signs in last 24 hours: Filed Vitals:   02/21/12 1950 02/21/12 2354 02/22/12 0347 02/22/12 0757  BP: 124/75 123/87 116/74 114/75  Pulse: 72 72 70 70  Temp: 98.3 F (36.8 C) 98.8 F (37.1 C) 98.6 F (37 C) 97.9 F (36.6 C)  TempSrc: Oral Oral Oral Oral  Resp: 20 19 19 14   Height:      Weight:      SpO2: 99% 99% 99% 98%   Weight change:   Intake/Output Summary (Last 24 hours) at 02/22/12 1128 Last data filed at 02/22/12 0757  Gross per 24 hour  Intake    250 ml  Output    650 ml  Net   -400 ml   Vitals reviewed. General: lying in bed in bed, NAD HEENT: R pupil irregular shape (prior cataract surgery), MMM of OP, EMO Cardiac: RRR, no rubs, murmurs or gallops Pulm: clear to auscultation bilaterally, no wheezes, rales, or rhonchi Abd: slightly distended, mild TTP of RUQ, no rebound or guarding. Normoactive BS Ext: warm and well perfused, no pedal edema Neuro:  Alert and oriented X3 CN2-12 intact Sensation intact face, b/l upper and lower extremities Strength 5/5 face bilateral upper and lower extremities Bilateral tremor w finger to nose testing Gait not assessed.    Lab Results: Basic Metabolic Panel:  Lab 0000000 [id number]  02/21/12 0817  NA 133* 134*  K 5.2* 5.5*  CL 96 100  CO2 20 16*  GLUCOSE 111* 104*  BUN 58* 42*  CREATININE 7.13* 5.57*  CALCIUM 6.8* 7.3*  MG -- --  PHOS -- --   Liver Function Tests:  Lab 02/22/12 0455 02/21/12 0817  AST 3409* 4594*  ALT 1529* 1638*  ALKPHOS 107 109  BILITOT 0.8 0.8  PROT 6.0 6.0    ALBUMIN 2.4* 2.6*  CBC:  Lab 02/22/12 0455 02/21/12 0530 02/19/12 2308  WBC 8.5 15.0* --  NEUTROABS -- -- 9.6*  HGB 12.5* 14.2 --  HCT 37.4* 42.3 --  MCV 89.5 91.2 --  PLT 78* PLATELET CLUMPS NOTED ON SMEAR, COUNT APPEARS ADEQUATE --   Cardiac Enzymes:  Lab 02/22/12 0937 02/21/12 1521 02/19/12 2309  CKTOTAL PENDING >50000* --  CKMB 60.7* -- --  CKMBINDEX -- -- --  TROPONINI 0.42* -- <0.30  CBG:  Lab 02/22/12 0730 02/22/12 0345 02/21/12 2349 02/21/12 1946 02/21/12 1804 02/21/12 1735  GLUCAP 113* 112* 102* 104* 233* 57*   Hemoglobin A1C:  Lab 02/20/12 0640  HGBA1C 5.8*   Fasting Lipid Panel:  Lab 02/20/12 0640  CHOL 245*  HDL 55  LDLCALC 121*  TRIG 345*  CHOLHDL 4.5  LDLDIRECT --  Coagulation:  Lab 02/22/12 0455 02/19/12 2308  LABPROT 14.6 15.0  INR 1.16 1.20  Urine Drug Screen: Drugs of Abuse     Component Value Date/Time   LABOPIA NONE DETECTED 02/20/2012 0105   COCAINSCRNUR NONE DETECTED 02/20/2012 0105   LABBENZ NONE DETECTED 02/20/2012 0105   AMPHETMU NONE DETECTED 02/20/2012 0105   THCU NONE DETECTED 02/20/2012 0105   LABBARB NONE DETECTED 02/20/2012 0105    Alcohol Level:  Lab 02/19/12 2321  ETH 105*   Urinalysis:  Lab 02/20/12 0104  COLORURINE AMBER*  LABSPEC 1.021  PHURINE 5.0  GLUCOSEU NEGATIVE  HGBUR LARGE*  BILIRUBINUR NEGATIVE  KETONESUR NEGATIVE  PROTEINUR >300*  UROBILINOGEN 0.2  NITRITE NEGATIVE  LEUKOCYTESUR TRACE*    Micro Results: Recent Results (from the past 240 hour(s))  MRSA PCR SCREENING     Status: Normal   Collection Time   02/21/12 12:58 PM      Component Value Range Status Comment   MRSA by PCR NEGATIVE  NEGATIVE Final    Studies/Results: Mri Brain Without Contrast  02/20/2012  *RADIOLOGY REPORT*  Clinical Data: 57 year old male with altered mental status. Confusion and slurred speech.  MRI HEAD WITHOUT CONTRAST  Technique:  Multiplanar, multiecho pulse sequences of the brain and surrounding structures were  obtained according to standard protocol without intravenous contrast.  Comparison: Head CT without contrast 02/19/2012.  Findings: 10 mm focus of restricted diffusion in the right cerebellum (series 3 image 5).  Smaller more linear focus of restricted effusion in the left cerebellar hemisphere (series 5 image 10).  Questionable asymmetric punctate focus in the lower brain stem on the left (series 5 image 13).  There is also evidence of a punctate focus restricted diffusion in the left external capsule anterior limb (series 5 image 28).  There might also be a nearby focus in the left caudate nucleus (series 5 image 30).  No other supratentorial diffusion abnormality.  Only subtle associated T2 hyperintensity at the larger cerebellar lesion.  No hemorrhage or mass effect. Major intracranial vascular flow voids are preserved.  Negative pituitary, cervicomedullary junction visualized cervical spine.  Normal bone marrow signal. Scattered subcortical cerebral white matter T2 and FLAIR hyperintensity not associated with the  acute findings is nonspecific and mild to moderate for age.  There are increased dilated perivascular spaces in the cerebral hemispheres.  No ventriculomegaly.  Postoperative changes to the right globe. Visualized paranasal sinuses and mastoids are clear.   Negative scalp soft tissues.  IMPRESSION: 1.  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  No mass effect or hemorrhage.  Possible associated acute punctate left brain stem infarct. 2.  Questionable acute left external capsule and caudate nucleus punctate lacunar infarct. This could reflect synchronous small vessel ischemia, less likely sequelae of embolic phenomena given the different vascular territories.   Original Report Authenticated By: Roselyn Reef, M.D.    US Abdomen Complete  02/21/2012  *RADIOLOGY REPORT*  Clinical Data:  57 year old male with elevated LFTs.  History of hepatitis C.  ABDOMINAL ULTRASOUND COMPLETE  Comparison:   None  Findings:  Gallbladder:  The gallbladder is unremarkable. There is no evidence of gallstones, gallbladder wall thickening, or pericholecystic fluid.  Common Bile Duct: There is no evidence of intrahepatic biliary dilatation. The CBD is dilated measuring 11 mm in greatest diameter.  No definite CBD filling defects are noted.  Liver: Diffuse increased echogenicity and heterogeneity of the liver is noted. There may be minimal nodularity of the hepatic contour suggestive of cirrhosis.  No definite focal hepatic lesions are identified.  IVC:  Appears normal.  Pancreas:  Although the pancreas is difficult to visualize in its entirety, no focal pancreatic abnormality is identified.  Spleen:  Within normal limits in size and echotexture.  Cysts within the spleen are noted.  Right kidney:  The right kidney is normal in size and parenchymal echogenicity.  There is no evidence of solid mass, hydronephrosis or definite renal calculi.  The right kidney measures 13.4 cm.  Left kidney:  The left kidney is normal in size and parenchymal echogenicity.  There is no evidence of solid mass, hydronephrosis or definite renal calculi.   The left kidney measures 13.2 cm.  Abdominal Aorta:  No abdominal aortic aneurysm identified.  The entire abdominal aorta is not well visualized secondary to overlying bowel gas.  There is no evidence of ascites.  IMPRESSION: Dense slightly heterogeneous liver with morphologic suggestive of cirrhosis.  Mild CBD dilatation - 11 mm.  Consider further evaluation.   Original Report Authenticated By: Margarette Canada, M.D.    Medications: I have reviewed the patient's current medications. Scheduled Meds:    . aspirin  325 mg Oral Daily  . cefTRIAXone (ROCEPHIN)  IV  1 g Intravenous Q24H  . folic acid  1 mg Oral Daily  . furosemide  80 mg Intravenous BID  . multivitamin with minerals  1 tablet Oral Daily  . thiamine  100 mg Oral Daily   Or  . thiamine  100 mg Intravenous Daily  . traZODone  100 mg  Oral QHS   Continuous Infusions:    .  sodium bicarbonate  infusion 1000 mL 125 mL/hr at 02/22/12 1017   PRN Meds:.LORazepam, LORazepam, ondansetron (ZOFRAN) IV, oxyCODONE   Assessment/Plan: Mr. Camelo is 57 y.o. male with a pmh of schizophrenia, HTN, Hep C, h/o polysubstance abuse (cocaine/EtOH) admitted with confusion and found to have multiple cerebellar infarcts, rhabdomyolysis, and acute renal failure and transaminitis.  1) Multiple acute cerebellar infarcts and chronic ischemic changes Suspect embolic source due to distribution, supported by TEE this am which showed PFO and R-->L shunt.  - LE doppler to look for clot. If +DVTs, will need anticoagulation.  If no clots  visualized, will need to continue antiplatelet therapy.  - Continue ASA 325mg  for secondary stroke prophylaxis - A1C 5.8 --> nondiabetic - LDL 121--> statin therapy contraindicated w transaminitis and rhabdo -PT/OT patient would likely benefit from inpatient rehab vs SNF  2) Acute renal failure Patient presents with ARF. Baseline Cr is around 1.2-1.3. Cr on admission 2.4, trending up daily and is now 7.1 UA on admission notable for granular and RBC casts. Suspect pigment-induced renal failure in setting of rhabdo w CK >50,000. May also be ATN from hypotension, ACEi/NSAIDS vs AIN vs glomerulonephritis. Oliguria over past 24 hours (25cc/hr). - Gave 500cc bolus. Needs aggressive IV hydration but EF 30-35% - Renal following, recommend: Bicarb drip and BID lasix for rhabdo (see #4 below) - Pending labs: urine eos, ANA, ANCA, GMB, ASA, C3, C4, cryglobulin - follow urine output Lab Results  Component Value Date   CREATININE 7.13* 02/22/2012     3) Transaminitis Patient with transaminitis, preserved synthetic function of liver. Acute hepatitis and HIV negative. Chronic Hep C. Negative tylenol level. Negative ANA. Abdominal ultrasound demonstrates dense hepatic architecture suspicious for cirrhosis, no evidence of  infarction. CBD also enlarged at 11 without clear etiology, no gall stones.  Suspect multifactorial : rhabdo, chronic + binge EtOH use, and shock liver in setting of hypotension. Transaminases continue to be markedly elevated by are downtrending. GI following.  - Continue to follow transaminases.  - Expect Bili uptrend to lag behind and injury resolves - Will need nonurgent MRI abd+MRCP, per GI  Lab Results  Component Value Date   ALT 1529* 02/22/2012   AST 3409* 02/22/2012   ALKPHOS 107 02/22/2012   BILITOT 0.8 02/22/2012   4) Rhabdomyolysis Patient's CK above upper limits normal >50,000. Etiology not clear. No prolonged immobilization. UDS negative for cocaine, although has h/o use. Not on statin therapy or other drug with known SE. Is chronic drinker with frequent binging and EtOH level 105 on admission. Denies any symptoms of antecedent viral illness prior to admission on our questioning,  although Dr. Barkley Bruns note documents endorsement of 1 week N/V/D prior to admission.  - Bicarb drip (174mEq @ 175 cc/hr), Lasix (80IV BID) - EKG this am looks good with no evidence of ischemia or T-wave peak - Calcium 6.8, corrects to 8.4 with albumin - Will check uric acid, phosphorous  5) Thrombocytopenia Platelets decreasing since admission. (160-->78). Has been getting enoxaparin for DVT prophylaxis. No active bleeding. 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   - Will change from enoxaparin-->SCDs - DIC panel - If LE dopplers reveal DVT, will need further anticoagulation  6) Anion Gap Metabolic Acidosis AG is decreasing (17 today), with bicarbonate of 20. Likely in setting of renal failure. No ketonuria.  - Continue to monitor daily  7) + Troponin Patient with mild increase in troponin 0.21-->0.42. No EKG changes to suggest ischemia. Suspect related to ARF, no obvious ACS. Receiving ASA for stroke. - Cardiology to see this  afternoon, appreciate input  8) ? UTI Trace leuks and cloudy urine. Had leukocytosis on admission which has resolved. - Cotinue ceftriaxone for empiri ctherapy.   9) EtOH abuse On CIWA.  10) Hypertension Has had hypotension since admission as well as acute renal failure. - Holding antiHTN  Dispo: Disposition is deferred at this time, awaiting improvement of current medical problems.  Anticipated discharge in approximately 3-4 day(s).   The patient does have a current PCP (No primary provider on file.), therefore  will be requiring OPC follow-up after discharge.   The patient does not have transportation limitations that hinder transportation to clinic appointments.  .Services Needed at time of discharge: Y = Yes, Blank = No PT:   OT:   RN:   Equipment:   Other:     LOS: 3 days   Tonia Brooms 02/22/2012, 11:28 AM

## 2012-02-22 NOTE — Progress Notes (Signed)
Internal Medicine Attending Transfer Accept Note Date: 02/22/2012  Patient name: [name] [name] Medical record number: [id number] Date of birth: 1955/05/22 Age: 58 y.o. Gender: male  Note: The patient currently has two charts in our system.  I saw and evaluated the patient, reviewed the chart, and discussed his care on a.m. rounds with house staff. I reviewed the note by resident Dr. Sandy Salaam and I agree with the resident's findings and plan as documented in her note, with the following additional comments.  History - key components related to admission: Patient is a 57 year old man with history of hypertension, hepatitis C, schizophrenia, alcohol abuse and other problems as outlined in the medical history, admitted to the hospitalist service on 02/20/2012 with altered mental status and slurred speech.  He was found to have small acute cerebellar strokes by MRI of the brain.  Hospital course has been notable for acute renal failure and acute elevations of liver transaminases, and workup disclosed a markedly elevated CK level consistent with rhabdomyolysis.  Patient's mental status is now clear.  Patient reports that he fell prior to being brought to the hospital but he says that he was only down for about half an hour.  He acknowledges recent binge drinking.  He reports recent dark urine.   Physical Exam - key components related to admission:  Filed Vitals:   02/21/12 2354 02/22/12 0347 02/22/12 0757 02/22/12 1237  BP: 123/87 116/74 114/75 113/77  Pulse: 72 70 70 70  Temp: 98.8 F (37.1 C) 98.6 F (37 C) 97.9 F (36.6 C) 98.6 F (37 C)  TempSrc: Oral Oral Oral Oral  Resp: 19 19 14 27   Height:      Weight:      SpO2: 99% 99% 98% 98%   Gen.: Lower, oriented Lungs: Clear Heart: Regular; no extra sounds or murmurs Abdomen: Bowel sounds present, soft; moderate right upper quadrant tenderness Extremities: No edema   Lab results:   Basic Metabolic Panel:  Basename 02/22/12 1141  02/22/12 0455 02/21/12 0817  NA -- 133* 134*  K -- 5.2* 5.5*  CL -- 96 100  CO2 -- 20 16*  GLUCOSE -- 111* 104*  BUN -- 58* 42*  CREATININE -- 7.13* 5.57*  CALCIUM -- 6.8* 7.3*  MG -- -- --  PHOS 7.1* -- --   Liver Function Tests:  [facility] 02/22/12 0455 02/21/12 0817  AST 3409* 4594*  ALT 1529* 1638*  ALKPHOS 107 109  BILITOT 0.8 0.8  PROT 6.0 6.0  ALBUMIN 2.4* 2.6*    CBC:  Basename 02/22/12 0455 02/21/12 0530 02/19/12 2308  WBC 8.5 15.0* --  NEUTROABS -- -- 9.6*  HGB 12.5* 14.2 --  HCT 37.4* 42.3 --  MCV 89.5 91.2 --  PLT 78* PLATELET CLUMPS NOTED ON SMEAR, COUNT APPEARS ADEQUATE --    Cardiac Enzymes:  Basename 02/22/12 0937 02/21/12 1521 02/19/12 2309  CKTOTAL >50000* >50000* --  CKMB 60.7* -- --  CKMBINDEX -- -- --  TROPONINI 0.42* -- <0.30     CBG:  Basename 02/22/12 1227 02/22/12 0730 02/22/12 0345 02/21/12 2349 02/21/12 1946 02/21/12 1804  GLUCAP 102* 113* 112* 102* 104* 233*    Hemoglobin A1C:  Basename 02/20/12 0640  HGBA1C 5.8*   Fasting Lipid Panel:  Basename 02/20/12 0640  CHOL 245*  HDL 55  LDLCALC 121*  TRIG 345*  CHOLHDL 4.5  LDLDIRECT --    Coagulation:  Basename 02/22/12 0455 02/19/12 2308  INR 1.16 1.20     Alcohol Level:  Basename 02/19/12  [place]    Drugs of Abuse     Component Value Date/Time   LABOPIA NONE DETECTED 02/20/2012 0105   COCAINSCRNUR NONE DETECTED 02/20/2012 0105   LABBENZ NONE DETECTED 02/20/2012 0105   AMPHETMU NONE DETECTED 02/20/2012 0105   THCU NONE DETECTED 02/20/2012 0105   LABBARB NONE DETECTED 02/20/2012 0105     Urinalysis    Component Value Date/Time   COLORURINE AMBER* 02/20/2012 0104   APPEARANCEUR CLOUDY* 02/20/2012 0104   LABSPEC 1.021 02/20/2012 0104   PHURINE 5.0 02/20/2012 0104   GLUCOSEU NEGATIVE 02/20/2012 0104   HGBUR LARGE* 02/20/2012 0104   BILIRUBINUR NEGATIVE 02/20/2012 0104   KETONESUR NEGATIVE 02/20/2012 0104   PROTEINUR >300* 02/20/2012 0104   UROBILINOGEN 0.2  02/20/2012 0104   NITRITE NEGATIVE 02/20/2012 0104   LEUKOCYTESUR TRACE* 02/20/2012 0104    Urine microscopic: 0-2 WBCs, 3-6 RBCs, granular casts, many bacteria.   Imaging results:  Mri Brain Without Contrast  02/20/2012  *RADIOLOGY REPORT*  Clinical Data: 57 year old male with altered mental status. Confusion and slurred speech.  MRI HEAD WITHOUT CONTRAST  Technique:  Multiplanar, multiecho pulse sequences of the brain and surrounding structures were obtained according to standard protocol without intravenous contrast.  Comparison: Head CT without contrast 02/19/2012.  Findings: 10 mm focus of restricted diffusion in the right cerebellum (series 3 image 5).  Smaller more linear focus of restricted effusion in the left cerebellar hemisphere (series 5 image 10).  Questionable asymmetric punctate focus in the lower brain stem on the left (series 5 image 13).  There is also evidence of a punctate focus restricted diffusion in the left external capsule anterior limb (series 5 image 28).  There might also be a nearby focus in the left caudate nucleus (series 5 image 30).  No other supratentorial diffusion abnormality.  Only subtle associated T2 hyperintensity at the larger cerebellar lesion.  No hemorrhage or mass effect. Major intracranial vascular flow voids are preserved.  Negative pituitary, cervicomedullary junction visualized cervical spine.  Normal bone marrow signal. Scattered subcortical cerebral white matter T2 and FLAIR hyperintensity not associated with the acute findings is nonspecific and mild to moderate for age.  There are increased dilated perivascular spaces in the cerebral hemispheres.  No ventriculomegaly.  Postoperative changes to the right globe. Visualized paranasal sinuses and mastoids are clear.   Negative scalp soft tissues.  IMPRESSION: 1.  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  No mass effect or hemorrhage.  Possible associated acute punctate left brain stem infarct.  2.  Questionable acute left external capsule and caudate nucleus punctate lacunar infarct. This could reflect synchronous small vessel ischemia, less likely sequelae of embolic phenomena given the different vascular territories.   Original Report Authenticated By: Roselyn Reef, M.D.    US Abdomen Complete  02/21/2012  *RADIOLOGY REPORT*  Clinical Data:  57 year old male with elevated LFTs.  History of hepatitis C.  ABDOMINAL ULTRASOUND COMPLETE  Comparison:  None  Findings:  Gallbladder:  The gallbladder is unremarkable. There is no evidence of gallstones, gallbladder wall thickening, or pericholecystic fluid.  Common Bile Duct: There is no evidence of intrahepatic biliary dilatation. The CBD is dilated measuring 11 mm in greatest diameter.  No definite CBD filling defects are noted.  Liver: Diffuse increased echogenicity and heterogeneity of the liver is noted. There may be minimal nodularity of the hepatic contour suggestive of cirrhosis.  No definite focal hepatic lesions are identified.  IVC:  Appears normal.  Pancreas:  Although  the pancreas is difficult to visualize in its entirety, no focal pancreatic abnormality is identified.  Spleen:  Within normal limits in size and echotexture.  Cysts within the spleen are noted.  Right kidney:  The right kidney is normal in size and parenchymal echogenicity.  There is no evidence of solid mass, hydronephrosis or definite renal calculi.  The right kidney measures 13.4 cm.  Left kidney:  The left kidney is normal in size and parenchymal echogenicity.  There is no evidence of solid mass, hydronephrosis or definite renal calculi.   The left kidney measures 13.2 cm.  Abdominal Aorta:  No abdominal aortic aneurysm identified.  The entire abdominal aorta is not well visualized secondary to overlying bowel gas.  There is no evidence of ascites.  IMPRESSION: Dense slightly heterogeneous liver with morphologic suggestive of cirrhosis.  Mild CBD dilatation - 11 mm.  Consider  further evaluation.   Original Report Authenticated By: Margarette Canada, M.D.     Other results: EKG: Normal sinus rhythm; nonspecific T abnormality   Assessment & Plan by Problem:  1.  Small acute cerebellar strokes.  A transesophageal echocardiogram today showed a patent foramen ovale (PFO), raising the question of whether patient's small cerebellar strokes are embolic in nature.  Lower extremity venous Dopplers are pending to rule out DVT; plan is continue aspirin pending that result.  Given rhabdomyolysis, no statin at this time.  2.  Rhabdomyolysis.  The underlying cause is not clear; it may be due to alcohol given patient's acknowledged binge drinking.  It could be medication related, since he was on fenofibrate prior to admission.  He was also on aripiprazole (Abilify), but has no obvious manifestations of neuroleptic malignant syndrome.  Plan is IV normal saline; sodium bicarbonate; furosemide; follow urine output, CK level, and renal function closely.  Need to follow closely for worsening hyperkalemia and treat as indicated.  3.  Acute renal failure.  Likely due to rhabdomyolysis; there may be a contribution from hypotension, volume depletion, and medications as well.  Nephrology consult is following patient.  Management of rhabdomyolysis as above.  4. Elevated transaminase levels.  The cause is not clear; GI consult suspects shock liver; there may be a component of alcoholic liver injury as well.  Today his transaminase levels are improving.  Plan is follow liver enzymes; supportive care.  5.  Elevated troponin I.  Likely due to acute renal failure; patient has no anginal symptoms.  Plan is monitor and cycle enzymes.  6. Other problems as per the resident physician's note.

## 2012-02-22 NOTE — Progress Notes (Signed)
Patient seen, examined, and I agree with the above documentation, including the assessment and plan. 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  This is very consistent with shock liver.  I would not be surprised if BILI trends up as the acute hepatic injury resolves.  AST/ALT likely will continue to decline.  ANA neg, APAP level neg. Chronic Hep C, [place] suggests dense hepatic architecture suspicious for cirrhosis.  CBD also enlarged at 11 without clear etiology.  Alk phos and Bili normal for now.  No stones in GB. Larger issues at play with rhabdo, renal failure, and newly dx cardiac shunt/pulm HTN; but I do recommend cross-sectional imaging of the liver at some point (nonurgent).  MRI abd + MRCP likely best test in this situation

## 2012-02-22 NOTE — CV Procedure (Signed)
TEE performed without complications: LVEF [id number] gllobal hypokinesis. RV mod dilatation with decreased EF moderate. Severe pulmonary hypertension Mild TR, Trace MR.  Interatrial septum bulges to the left suggests elevated right heart pressure. PFO with spontaneous color and double contrast shunting at rest and Valsalva.  Aorta normal/ Imp: Paradoxical embolus with PFO with strongly positive contrast study for right to left shunting could be source of cerebral embolism.

## 2012-02-22 NOTE — Progress Notes (Signed)
02/22/2012 11:11 AM  Critical Lab values called.  CKMB - 60.7 and Troponin I - 0.42.  Results given to Dr. Sandy Salaam.  Will continue to monitor.   Tilda Burrow [place]

## 2012-02-22 NOTE — Consult Note (Signed)
CARDIOLOGY CONSULT NOTE  Patient ID: [name] [name] MRN: [id number] DOB/AGE: [birth year] 57 y.o.  Admit date: 02/19/2012 Referring Physician  Bertha Stakes, MD Primary Physician:  No primary provider on file. Reason for Consultation  Abnormal troponins  HPI: Patient is a 57 year old African American male with history of schizophrenia, hypertension, chronic hepatitis C and history of fall: Abuse was admitted to the hospital today with a with altered mental status and was found to have posterior circulation stroke which appeared to be embolic. He was also found to have acute renal failure and rhabdomyolysis. History is obtained per chart and also from patient directly. Patient does not recollect all the events, however does agree that he had fallen about 3 days ago after a binge alcohol drinking of 2 bottles of 2/5 liquor and the next day he does not remember much and was confused and was admitted to the hospital. He states that now he is feeling better and states that he's not having any problems and denies any chest pain, shortness breath, PND, orthopnea. He is also noticed that his urine color has improved and he is also stating that it was dark 2 days ago. No further history regarding prior to hospital admission is obtainable. Patient states that he was previously used cocaine but has not used in years. Presently denies any neurological deficits. Patient has been pretty much bedbound since hospital admit.  Past Medical History  Diagnosis Date  . Hypertension   . Schizophrenia   . Hepatitis C   . HIT (heparin-induced thrombocytopenia)   . H/O: alcohol abuse   . H/O cocaine abuse      Past Surgical History  Procedure Date  . Back surgery      Family History  Problem Relation Age of Onset  . CAD Mother   . CAD Father   . CAD Sister      Social History: History   Social History  . Marital Status: Single    Spouse Name: N/A    Number of Children: N/A  . Years of Education: N/A    Occupational History  . Not on file.   Social History Main Topics  . Smoking status: Never Smoker   . Smokeless tobacco: Not on file  . Alcohol Use: Yes  . Drug Use: No  . Sexually Active:    Other Topics Concern  . Not on file   Social History Narrative  . No narrative on file     Prescriptions prior to admission  Medication Sig Dispense Refill  . cloNIDine (CATAPRES) 0.3 MG tablet Take 0.3 mg by mouth 2 (two) times daily.      . furosemide (LASIX) 20 MG tablet Take 20 mg by mouth daily.      Marland Kitchen labetalol (NORMODYNE) 100 MG tablet Take 100 mg by mouth 2 (two) times daily.      Marland Kitchen lisinopril (PRINIVIL,ZESTRIL) 20 MG tablet Take 20 mg by mouth daily.      . traZODone (DESYREL) 100 MG tablet Take 100 mg by mouth at bedtime.        Scheduled Meds:   . aspirin  325 mg Oral Daily  . cefTRIAXone (ROCEPHIN)  IV  1 g Intravenous Q24H  . folic acid  1 mg Oral Daily  . furosemide  80 mg Intravenous BID  . multivitamin with minerals  1 tablet Oral Daily  . thiamine  100 mg Oral Daily  . traZODone  100 mg Oral QHS   Continuous Infusions:   .  sodium bicarbonate  infusion 1000 mL 175 mL/hr at 02/22/12 1300   PRN Meds:.LORazepam, LORazepam, ondansetron (ZOFRAN) IV, oxyCODONE  ROS: General: no fevers/chills/night sweats Eyes: no blurry vision, diplopia, or amaurosis ENT: no sore throat or hearing loss Resp: no cough, wheezing, or hemoptysis CV: no edema or palpitations GI: no abdominal pain, nausea, vomiting, diarrhea, or constipation GU: Had noted dark urine 2 days ago. Skin: no rash Neuro: no headache, numbness, tingling, or weakness of extremities Musculoskeletal: no joint pain or swelling Heme: no bleeding, DVT, or easy bruising Endo: no polydipsia or polyuria Other systems are negative    Physical Exam: Blood pressure 125/77, pulse 77, temperature 98.6 F (37 C), temperature source Oral, resp. rate 28, height 6' (1.829 m), weight 112.038 kg (247 lb), SpO2 95.00%.    General appearance: alert, cooperative, appears older than stated age, no distress and moderately obese Lungs: clear to auscultation bilaterally Chest wall: no tenderness Heart: S1, S2 normal, S4 present, no rub and no murmur Abdomen: soft, non-tender; bowel sounds normal; no masses,  no organomegaly and pannus present Extremities: extremities normal, atraumatic, no cyanosis or edema Pulses: 2+ and symmetric Neurologic: Grossly normal  Labs:   Lab Results  Component Value Date   WBC 8.5 02/22/2012   HGB 12.5* 02/22/2012   HCT 37.4* 02/22/2012   MCV 89.5 02/22/2012   PLT 77* 02/22/2012    Lab 02/22/12 1424 02/22/12 0455  NA 131* --  K 5.0 --  CL 92* --  CO2 23 --  BUN 60* --  CREATININE 7.85* --  CALCIUM 6.7* --  PROT -- 6.0  BILITOT -- 0.8  ALKPHOS -- 107  ALT -- 1529*  AST -- 3409*  GLUCOSE 108* --   Lab Results  Component Value Date   CKTOTAL >50000* 02/22/2012   CKMB 51.6* 02/22/2012   TROPONINI 0.52* 02/22/2012    Lipid Panel     Component Value Date/Time   CHOL 245* 02/20/2012 0640   TRIG 345* 02/20/2012 0640   HDL 55 02/20/2012 0640   CHOLHDL 4.5 02/20/2012 0640   VLDL 69* 02/20/2012 0640   LDLCALC 121* 02/20/2012 0640    EKG: EKG 02/22/2012: unchanged from previous tracings, normal sinus rhythm, right axis deviation, LVH with repolarization abnormality. Compared to the EKG done on 02/19/2012 no significant change. TEE 02/22/12: LVEF 30% gllobal hypokinesis.  RV mod dilatation with decreased EF moderate. Severe pulmonary hypertension  Mild TR, Trace MR.  Interatrial septum bulges to the left suggests elevated right heart pressure. PFO with spontaneous color and double contrast shunting at rest and Valsalva.  Aorta normal.  Imp: Paradoxical embolus with PFO with strongly positive contrast study for right to left shunting could be source of cerebral embolism.    Radiology: 02/21/12" IMPRESSION: Dense slightly heterogeneous liver with morphologic suggestive of  cirrhosis.  Mild CBD dilatation - 11 mm.  Consider further evaluation.   Original Report Authenticated By: Margarette Canada, M.D.     ASSESSMENT AND PLAN:  1. Chronic systolic and diastolic heart failure 2. 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 Severe LV systolic dysfunction by echocardiogram. 3. Severe pulmonary hypertension probably induced by obesity and cardiomyopathy 4. Cerebral embolism with infarct without residual defect, paradoxical embolus cannot be excluded due to presence of PFO and pulmonary hypertension making it a DG pathway for paradoxical embolus. Strongly positive double contrast study for right to left shunting at rest during TEE. 5. Abnormal liver enzymes, rhabdomyolysis is the probable etiology. AST is more elevated than ALT again suggestive of  probable not hepatic origin of the enzymes. 6. Abnormal serum troponin in a patient with acute renal failure, chronic systolic and diastolic heart failure, does not suggest acute ongoing myocardial injury although does suggest non-ST elevation myocardial infarction. 7. Acute renal failure, severe probably due to rhabdomyolysis. 8. Mixed  Hyperlipidemia. 9. 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 Not a candidate for ACEi or ARB due to ARF  Patient also is suggestion of cirrhosis of the liver and do to abnormal platelet reactivity in a patient with renal failure and hepatic failure and thrombus opinion, would be at high risk for aspirin for bleeding complication. Not much further to be performed from cardiac standpoint until he is stable from medical standpoint. Please do not hesitate to contact me with my contact information below. I thank you for asking you to see this gentleman and call me directly if  you have any concerns or questions.  Laverda Page, MD 02/22/2012, 7:29 PM [facility] [place] Pager: [phone] Office: [phone] If no answer Cell [phone]

## 2012-02-22 NOTE — Progress Notes (Signed)
  Echocardiogram Echocardiogram Transesophageal has been performed.  Basilia Jumbo 02/22/2012, 9:39 AM

## 2012-02-23 DIAGNOSIS — N19 Unspecified kidney failure: Secondary | ICD-10-CM

## 2012-02-23 LAB — HEPATIC FUNCTION PANEL
ALT: 927 U/L — ABNORMAL HIGH (ref 0–53)
AST: 1016 U/L — ABNORMAL HIGH (ref 0–37)
Alkaline Phosphatase: 132 U/L — ABNORMAL HIGH (ref 39–117)
Bilirubin, Direct: 0.7 mg/dL — ABNORMAL HIGH (ref 0.0–0.3)
Total Bilirubin: 1.1 mg/dL (ref 0.3–1.2)

## 2012-02-23 LAB — RENAL FUNCTION PANEL
Albumin: 2.4 g/dL — ABNORMAL LOW (ref 3.5–5.2)
Albumin: 2.6 g/dL — ABNORMAL LOW (ref 3.5–5.2)
BUN: 66 mg/dL — ABNORMAL HIGH (ref 6–23)
Calcium: 6.6 mg/dL — ABNORMAL LOW (ref 8.4–10.5)
Chloride: 93 mEq/L — ABNORMAL LOW (ref 96–112)
Chloride: 94 mEq/L — ABNORMAL LOW (ref 96–112)
Creatinine, Ser: 8.85 mg/dL — ABNORMAL HIGH (ref 0.50–1.35)
GFR calc Af Amer: 7 mL/min — ABNORMAL LOW (ref >90)
GFR calc non Af Amer: 6 mL/min — ABNORMAL LOW (ref >90)
GFR calc non Af Amer: 6 mL/min — ABNORMAL LOW (ref >90)
Phosphorus: 7.7 mg/dL — ABNORMAL HIGH (ref 2.3–4.6)
Phosphorus: 8.2 mg/dL — ABNORMAL HIGH (ref 2.3–4.6)
Potassium: 4.9 mEq/L (ref 3.5–5.1)
Sodium: 131 mEq/L — ABNORMAL LOW (ref 135–145)

## 2012-02-23 LAB — GLUCOSE, CAPILLARY

## 2012-02-23 LAB — IMMUNOFIXATION ELECTROPHORESIS
IgA: 421 mg/dL — ABNORMAL HIGH (ref 68–379)
IgG (Immunoglobin G), Serum: 959 mg/dL (ref 650–1600)
Total Protein ELP: 5.3 g/dL — ABNORMAL LOW (ref 6.0–8.3)

## 2012-02-23 LAB — PROTEIN ELECTROPHORESIS, SERUM
Gamma Globulin: 16.8 % (ref 11.1–18.8)
M-Spike, %: NOT DETECTED g/dL
Total Protein ELP: 5.3 g/dL — ABNORMAL LOW (ref 6.0–8.3)

## 2012-02-23 LAB — TROPONIN I
Troponin I: 0.62 ng/mL (ref <0.30)
Troponin I: 0.64 ng/mL (ref <0.30)

## 2012-02-23 LAB — C4 COMPLEMENT: Complement C4, Body Fluid: 18 mg/dL (ref 10–40)

## 2012-02-23 LAB — CBC
MCH: 30 pg (ref 26.0–34.0)
MCHC: 33.5 g/dL (ref 30.0–36.0)
MCV: 89.6 fL (ref 78.0–100.0)
Platelets: 78 10*3/uL — ABNORMAL LOW (ref 150–400)
RDW: 14.6 % (ref 11.5–15.5)

## 2012-02-23 LAB — C3 COMPLEMENT: C3 Complement: 96 mg/dL (ref 90–180)

## 2012-02-23 LAB — MAGNESIUM: Magnesium: 1.8 mg/dL (ref 1.5–2.5)

## 2012-02-23 LAB — CK TOTAL AND CKMB (NOT AT ARMC)
CK, MB: 32.2 ng/mL (ref 0.3–4.0)
CK, MB: 23.1 ng/mL (ref 0.3–4.0)
Relative Index: 0.1 (ref 0.0–2.5)
Relative Index: 0.1 (ref 0.0–2.5)

## 2012-02-23 MED ORDER — SODIUM CHLORIDE 0.9 % IJ SOLN
INTRAMUSCULAR | Status: AC
  Filled 2012-02-23: qty 30

## 2012-02-23 MED ORDER — SODIUM CHLORIDE 0.9 % IV BOLUS (SEPSIS)
500.0000 mL | Freq: Once | INTRAVENOUS | Status: AC
  Administered 2012-02-23: 500 mL via INTRAVENOUS

## 2012-02-23 MED ORDER — SODIUM CHLORIDE 0.9 % IV BOLUS (SEPSIS)
500.0000 mL | INTRAVENOUS | Status: AC
  Administered 2012-02-23 (×3): 500 mL via INTRAVENOUS

## 2012-02-23 MED ORDER — CALCIUM CARBONATE ANTACID 500 MG PO CHEW
400.0000 mg | CHEWABLE_TABLET | Freq: Three times a day (TID) | ORAL | Status: DC
  Administered 2012-02-23 – 2012-02-29 (×20): 400 mg via ORAL
  Filled 2012-02-23 (×21): qty 2

## 2012-02-23 MED ORDER — SODIUM CHLORIDE 0.9 % IV BOLUS (SEPSIS)
500.0000 mL | INTRAVENOUS | Status: AC
  Administered 2012-02-23 – 2012-02-24 (×3): 500 mL via INTRAVENOUS

## 2012-02-23 MED ORDER — HYDRALAZINE HCL 25 MG PO TABS
25.0000 mg | ORAL_TABLET | Freq: Three times a day (TID) | ORAL | Status: DC
  Administered 2012-02-23 – 2012-02-24 (×3): 25 mg via ORAL
  Filled 2012-02-23 (×6): qty 1

## 2012-02-23 MED ORDER — CARVEDILOL 3.125 MG PO TABS
3.1250 mg | ORAL_TABLET | Freq: Two times a day (BID) | ORAL | Status: DC
  Administered 2012-02-23 – 2012-02-27 (×8): 3.125 mg via ORAL
  Filled 2012-02-23 (×11): qty 1

## 2012-02-23 MED ORDER — FUROSEMIDE 10 MG/ML IJ SOLN
80.0000 mg | Freq: Four times a day (QID) | INTRAMUSCULAR | Status: DC
  Administered 2012-02-23 – 2012-02-24 (×7): 80 mg via INTRAVENOUS
  Filled 2012-02-23 (×10): qty 8

## 2012-02-23 NOTE — Progress Notes (Signed)
     [facility] Daily Rounding Note 02/23/2012, 9:45 AM  SUBJECTIVE:       Nausea but no vomiting this AM.  Not brought on by meals, which he is tolerating.  Not eating much.  Brown BM yesterday. No belly pain. Denies oliguria.   OBJECTIVE:         Vital signs in last 24 hours:    Temp:  [98 F (36.7 C)-98.7 F (37.1 C)] 98.7 F (37.1 C) (01/28 0800) Pulse Rate:  [68-85] 70  (01/28 0800) Resp:  [15-32] 23  (01/28 0800) BP: (113-157)/(77-105) 157/105 mmHg (01/28 0800) SpO2:  [90 %-100 %] 90 % (01/28 0800) Last BM Date: 02/21/12 General: obese, NAD   Heart: RRR Chest: clear B.  Slight dyspnea with speech.  Abdomen: distended, active BS.  Tender without guard/rebound at the RUQ  Extremities: no pedal edem.  Feet warm Neuro/Psych:  Cooperative, not confused, not agitated.   Intake/Output from previous day: 01/27 0701 - 01/28 0700 In: 1385 [P.O.:360; I.V.:1025] Out: 1340 [Urine:1340]  Intake/Output this shift: Total I/O In: 2633 [I.V.:2625; IV Piggyback:8] Out: 675 [Urine:675]  Lab Results:  [facility] 02/23/12 0304 02/22/12 1425 02/22/12 0455 02/21/12 0530  WBC 7.7 -- 8.5 15.0*  HGB 11.8* -- 12.5* 14.2  HCT 35.2* -- 37.4* 42.3  PLT 78* 77* 78* --   BMET  Basename 02/23/12 0845 02/23/12 0304 02/22/12 2026  NA 134* 131* 129*  K 5.1 4.9 5.3*  CL 94* 93* 90*  CO2 21 23 21   GLUCOSE 109* 113* 102*  BUN 67* 66* 64*  CREATININE 8.85* 8.52* 8.16*  CALCIUM 6.6* 6.4* 6.8*   LFT  Basename 02/23/12 0845 02/23/12 0304 02/22/12 1424 02/22/12 0455 02/21/12 0817  PROT -- -- -- 6.0 6.0  ALBUMIN 2.6* 2.4* 2.4* -- --  AST -- -- -- 3409* 4594*  ALT -- -- -- 1529* 1638*  ALKPHOS -- -- -- 107 109  BILITOT -- -- -- 0.8 0.8  BILIDIR -- -- -- -- --  IBILI -- -- -- -- --   PT/INR  Basename 02/22/12 1425 02/22/12 0455  LABPROT 14.6 14.6  INR 1.16 1.16    ASSESMENT: * Shock liver.  * HCV, untreated. ultrasound appearance of liver (nodular) now suggesting cirrhosis. CBD is 11  MM.  * Acute renal failure, worsening. Rhabdo and multiple other renal insults.  Nephrology following.  * Hyperkalemia.  * Thrombocytopenia.  * Alcohol abuse, polysubstance abuse. .  * EF 30. TEE 1/27:  EF 30%, propable alcoholic CM, severe pulm htn, PFO, probable elevated right heart pressures. Paradoxical embolus with PFO with strongly positive contrast study for right to left shunting could be source of cerebral embolism.   PLAN: *  LFTs in AM   LOS: 4 days   Azucena Freed  02/23/2012, 9:45 AM Pager: [phone]

## 2012-02-23 NOTE — Progress Notes (Signed)
Stroke Team Progress Note  HISTORY [name] [name] is a 57 y.o. male with a history of schizpphrenia who was brought to the ER 02/19/2012 due to AMS. MRI revealed cerebellar infarcts. He states that he fell yesterday 02/18/2012, but was not sure why, then noticed that he could not move his left side. This improved and he currently is only left with numbness of the right hand and left lower leg. He states that yesterday, he did not feel confused, but rather was confused about what caused him to fall. Patient was not a TPA candidate secondary to delay in arrival. He was admitted for further evaluation and treatment.  SUBJECTIVE Stable. No changes.  OBJECTIVE Most recent Vital Signs: Filed Vitals:   02/22/12 2051 02/22/12 2315 02/23/12 0400 02/23/12 0800  BP: 136/87 129/86 138/90 157/105  Pulse:  72 68 70  Temp: 98.6 F (37 C) 98.2 F (36.8 C) 98 F (36.7 C) 98.7 F (37.1 C)  TempSrc: Oral Oral Oral Oral  Resp:  32 22 23  Height:      Weight:      SpO2:  91% 95% 90%   CBG (last 3)   Basename 02/23/12 0808 02/22/12 2152 02/22/12 1625  GLUCAP 101* 96 127*   IV Fluid Intake:      .  sodium bicarbonate  infusion 1000 mL 175 mL/hr at 02/23/12 0300   MEDICATIONS     . aspirin  325 mg Oral Daily  . cefTRIAXone (ROCEPHIN)  IV  1 g Intravenous Q24H  . folic acid  1 mg Oral Daily  . furosemide  80 mg Intravenous BID  . multivitamin with minerals  1 tablet Oral Daily  . thiamine  100 mg Oral Daily  . traZODone  100 mg Oral QHS   PRN:  LORazepam, LORazepam, ondansetron (ZOFRAN) IV, oxyCODONE  Diet:  General thin liquids Activity:   Bathroom privileges with assistance DVT Prophylaxis:  SCDs   CLINICALLY SIGNIFICANT STUDIES Basic Metabolic Panel:   Lab [id number] 02/23/12 0304 02/22/12 2026 02/22/12 1424  NA -- 131* 129* --  K -- 4.9 5.3* --  CL -- 93* 90* --  CO2 -- 23 21 --  GLUCOSE -- 113* 102* --  BUN -- 66* 64* --  CREATININE -- 8.52* 8.16* --  CALCIUM -- 6.4* 6.8*  --  MG 1.8 -- -- --  PHOS -- 7.7* -- 7.1*   Liver Function Tests:   Lab 02/23/12 0304 02/22/12 1424 02/22/12 0455 02/21/12 0817  AST -- -- 3409* 4594*  ALT -- -- 1529* 1638*  ALKPHOS -- -- 107 109  BILITOT -- -- 0.8 0.8  PROT -- -- 6.0 6.0  ALBUMIN 2.4* 2.4* -- --   CBC:   Lab 02/23/12 0304 02/22/12 1425 02/22/12 0455 02/19/12 2308  WBC 7.7 -- 8.5 --  NEUTROABS -- -- -- 9.6*  HGB 11.8* -- 12.5* --  HCT 35.2* -- 37.4* --  MCV 89.6 -- 89.5 --  PLT 78* 77* -- --   Coagulation:   Lab 02/22/12 1425 02/22/12 0455 02/19/12 2308  LABPROT 14.6 14.6 15.0  INR 1.16 1.16 1.20   Cardiac Enzymes:   Lab 02/23/12 0309 02/22/12 2026 02/22/12 1424  CKTOTAL 16109* >50000* >50000*  CKMB 32.2* 42.5* 51.6*  CKMBINDEX -- -- --  TROPONINI 0.48* 0.54* 0.52*   Urinalysis:   Lab 02/20/12 0104  COLORURINE AMBER*  LABSPEC 1.021  PHURINE 5.0  GLUCOSEU NEGATIVE  HGBUR LARGE*  BILIRUBINUR NEGATIVE  KETONESUR NEGATIVE  PROTEINUR >300*  UROBILINOGEN 0.2  NITRITE NEGATIVE  LEUKOCYTESUR TRACE*   Lipid Panel    Component Value Date/Time   CHOL 245* 02/20/2012 0640   TRIG 345* 02/20/2012 0640   HDL 55 02/20/2012 0640   CHOLHDL 4.5 02/20/2012 0640   VLDL 69* 02/20/2012 0640   LDLCALC 121* 02/20/2012 0640   HgbA1C  Lab Results  Component Value Date   HGBA1C 5.8* 02/20/2012    Urine Drug Screen:     Component Value Date/Time   LABOPIA NONE DETECTED 02/20/2012 0105   COCAINSCRNUR NONE DETECTED 02/20/2012 0105   LABBENZ NONE DETECTED 02/20/2012 0105   AMPHETMU NONE DETECTED 02/20/2012 0105   THCU NONE DETECTED 02/20/2012 0105   LABBARB NONE DETECTED 02/20/2012 0105    Alcohol Level:   Lab 02/19/12 2321  ETH 105*   CT of the brain    MRI of the brain  02/20/2012  1.  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  No mass effect or hemorrhage.  Possible associated acute punctate left brain stem infarct. 2.  Questionable acute left external capsule and caudate nucleus punctate  lacunar infarct. This could reflect synchronous small vessel ischemia, less likely sequelae of embolic phenomena given the different vascular territories.   MRA of the brain    2D Echocardiogram  EF 30-35% with no source of embolus. Possible hypokinesis of the midinferolateral myocardium.  TEE PFO with right to left shunt, EF 30%, pulmonary hypertension  Carotid Doppler    TCD   CXR    EKG  normal sinus rhythm.   US Abdomen 02/21/2012   Dense slightly heterogeneous liver with morphologic suggestive of cirrhosis.  Mild CBD dilatation - 11 mm.  Consider further evaluation.  Therapy Recommendations   Physical Exam   General: in bed, NAD  CV: RRR  Mental Status:  Patient is awake, alert, oriented to person, place, month, year, and situation.  No sign of aphasia  Cranial Nerves:  II: Visual Fields are full. Pupils are equal, round, and reactive to light. Discs are difficult to visualize  III,IV, VI: EOMI without ptosis or diploplia. or nystagmus V: Facial sensation is symmetric to temperature  VII: Facial movement is symmetric.  VIII: hearing is intact to voice  X: Uvula elevates symmetrically  XI: Shoulder shrug is symmetric.  XII: tongue is midline without atrophy or fasciculations.  Motor:  Tone is normal. Bulk is normal. 5/5 strength was present in all four extremities.  Sensory:  Sensation is reduced in the right hand and left leg below the knee.  Deep Tendon Reflexes:  1+ and symmetric in the biceps, trace at patellae  Cerebellar:  FNF impaired on right consistent with dysmetria.  Gait: Not tested due to leg pain  ASSESSMENT [name] is a 57 y.o. male presenting with a fall of unknown etiology . Imaging confirms a right greater than left cerebellar infarcts. Infarcts felt to be  embolic secondary to unknown etiology. TEE just completed which shows PFO. Could be related if patient has LE VTE. On no antiplatelet prior to admission. Now on aspirin 325 mg orally every  day for secondary stroke prevention. Patient with resultant left hemisensory deficit and ataxia.  PFO (patent foramen ovale) likely an incidental finding.  Pulmonary Hypertension per TEE CHF w/ EF 30-35% Thmbocytopenia, Plt 160 down to 78. lovenox stopped Hypertension Schizophrenia Acute renal failure, nephrology following HIT Hepatitis C Hx cocaine and etoh abuse, on CIWA  Hyperlipidemia, LDL 121, not on statin PTA, goal LDL < 100, statin  therapy contraindicated w transaminitis and rhabdo  Diabetes, HgbA1c 5.8  Hospital day # 4  TREATMENT/PLAN  Continue aspirin 325 mg orally every day for secondary stroke prevention. F/u LE venous dopplers for DVT as possible cause of stroke.  F/u TCD to look at vasculature  Therapy evals when medically able  Burnetta Sabin, MSN, RN, ANVP-BC, ANP-BC, GNP-BC [facility] Pager: [phone] 02/23/2012 8:26 AM  I have personally obtained a history, examined the patient, evaluated imaging results, and formulated the assessment and plan of care. I agree with the above.  Antony Contras, MD Medical Director [facility] Pager: [phone] 02/23/2012 8:26 AM

## 2012-02-23 NOTE — Progress Notes (Signed)
Rehab Admissions Coordinator Note:  Patient was screened by Retta Diones for appropriateness for an Inpatient Acute Rehab Consult.  Noted PT recommending possible need for CIR.  At this time, we are recommending Inpatient Rehab consult.  Retta Diones 02/23/2012, 8:41 AM  I can be reached at [phone].

## 2012-02-23 NOTE — Progress Notes (Signed)
OT Cancellation Note  Patient Details Name: [name] [name] MRN: [id number] DOB: 11-26-1955   Cancelled Treatment:    Reason Eval/Treat Not Completed: Patient not medically ready ( elevated troponin levels at this time)  Veneda Melter Pager: [phone]  02/23/2012, 2:26 PM

## 2012-02-23 NOTE — Progress Notes (Addendum)
Patient seen, examined, and I agree with the above documentation, including the assessment and plan. Repeat liver function panel tomorrow. Clinical picture fits with ischemic hepatitis/shock liver. He has a history of chronic hepatitis C, treatment can be addressed as an outpatient Alcohol cessation is paramount from a liver standpoint MRI abdomen plus MRCP reasonable as an outpatient to evaluate CBD enlargement  we will review the hepatic panel tomorrow. Please call with questions  Acute renal failure with nephrology following PFO with likely cerebral embolism

## 2012-02-23 NOTE — Progress Notes (Signed)
PT Cancellation Note  Patient Details Name: [name] [name] MRN: [id number] DOB: 04-21-1955   Cancelled Treatment:    Reason Eval/Treat Not Completed: Medical issues which prohibited therapy (troponin elevated and not yet declining) Will check back at later date to progress with therapy. Thanks Elwyn Reach, [place]

## 2012-02-23 NOTE — Progress Notes (Signed)
[facility]  Subjective:  Poor appetite. No increased shortness of breath.  He looks pretty good   Objective: Vital signs in last 24 hours: Blood pressure 157/105, pulse 70, temperature 98.7 F (37.1 C), temperature source Oral, resp. rate 23, height 6' (1.829 m), weight 247 lb (112.038 kg), SpO2 90.00%.    PHYSICAL EXAM Gen: NAD, sitting in chair Chest: coarse breath sounds bilaterally Cardio: regular rate and rhythm, S1, S2 normal, no murmur GI: soft, non-tender; bowel sounds normal; distended  Extremities: no edema Neurologic: No focal deficit  Lab Results:   Lab 02/23/12 0304 02/22/12 2026 02/22/12 1424 02/22/12 1141  NA 131* 129* 131* --  K 4.9 5.3* 5.0 --  CL 93* 90* 92* --  CO2 23 21 23  --  BUN 66* 64* 60* --  CREATININE 8.52* 8.16* 7.85* --  ALB -- -- -- --  GLUCOSE 113* -- -- --  CALCIUM 6.4* 6.8* 6.7* --  PHOS 7.7* -- 7.1* 7.1*     Basename 02/23/12 0304 02/22/12 1425 02/22/12 0455  WBC 7.7 -- 8.5  HGB 11.8* -- 12.5*  HCT 35.2* -- 37.4*  PLT 78* 77* --     Scheduled Meds:    . aspirin  325 mg Oral Daily  . cefTRIAXone (ROCEPHIN)  IV  1 g Intravenous Q24H  . folic acid  1 mg Oral Daily  . furosemide  80 mg Intravenous BID  . multivitamin with minerals  1 tablet Oral Daily  . thiamine  100 mg Oral Daily  . traZODone  100 mg Oral QHS   Continuous Infusions:    .  sodium bicarbonate  infusion 1000 mL 175 mL/hr at 02/23/12 0300   PRN Meds:.LORazepam, LORazepam, ondansetron (ZOFRAN) IV, oxyCODONE  Assessment/Plan: Pt is a 57 y.o. yo male with a PMHx of schizophrenia, HTN, Hep C, h/o polysubstance abuse who was admitted to [facility] on 02/19/2012 for evaluation of altered mental status with confusion and slurred speech, found to have cerebellar stroke on MRI   # Acute renal failure: Creatinine on admission: 2.34 to 8.52 today. Baseline Cr: 1.2 on 02/08/12. Acute renal failure likely multifactorial from ATN from hypotension, ACEI and NSAID  use along with rhabdomyolysis kidney injury. He is nonoliguric Improved urine output with lasix and fluids.  - ANA, ANCA negative, GBM negative. C3,C4 pending. Cryoglobulin pending - abdominal US: no hydronephrosis.  - Pr/Cr: elevated at 0.80.  - FeNa pending  - urine eosinophils: pending - SPEP: pending, but kappa/lambda ratio normal - Strict I's/Os will increase lasix to attempt slightly more out than in.  I feel like uremia will eventually become a problem # Rhabdomyolisis: on lasix and bicarb drip at 175 cc/hr.  - hypocalcemia likely from rhabdo: no evidence of tetany. Will hold treating. Just start some tums with meals to help calcium and phos - no hyperkalemia or acidosis or anuria requiring dialysis - continue IV hydration and close monitoring of BMP and CK.  # Anion gap acidosis: likely from renal failure. stable on bicarb drip.  # Hypotension: resolved. Now hypertensive. recs for hydralazine and coreg by Dr. Nadyne Coombes with cardiology.  # CVA and altered mental status: followed by neurology. AMS improved.  # Transaminitis: GI consulted. Known hepatitis C. Hep A and B negative. Tylenol<15. Ethanol:105. Abdominal US pending.  # UTI: urine culture: no growth. On ceftriaxone  # Schizophrenia: per primary team  # Alcohol abuse: on CIWA with thiamine, folic acid and multivit.  # PFO: not currently anticoagulated given low platelet  count  Liam Graham, PGY-2  Family Medicine Resident     LOS: 4 days   Liam Graham 02/23/2012,8:36 AM  Patient seen and examined, agree with above note with above modifications. Worsening ARF in the setting of multiple renal insults, most notably significant hypotension and CK greater than 50,000.  Will continue to use iv bicarb and lasix at increased dose but dont have a good feeling that he will dodge dialysis.  No indications yet, will continue to follow.  Also start TUMS with meals Corliss Parish, MD 02/23/2012

## 2012-02-23 NOTE — Progress Notes (Signed)
Internal Medicine Attending  Date: 02/23/2012  Patient name: [name] [name] Medical record number: [id number] Date of birth: 02-Oct-1955 Age: 57 y.o. Gender: male  I saw and evaluated the patient and discussed his care with house staff. I reviewed the resident's note by Dr. Sandy Salaam and I agree with the resident's findings and plans as documented in her note.

## 2012-02-23 NOTE — Progress Notes (Signed)
*  PRELIMINARY RESULTS* Vascular Ultrasound Lower extremity venous duplex has been completed.  Preliminary findings: Bilateral:  No evidence of DVT, superficial thrombosis, or Baker's Cyst.    Landry Mellow, RDMS, RVT 02/23/2012, 11:16 AM

## 2012-02-23 NOTE — Progress Notes (Signed)
Subjective: Sitting up in bed, reports feeling better. Minimal abdominal pain today. Some nausea, but is eager to try and eat breakfast. Improved UOP with 1340 cc out over prior 24 hours.  Denies CP, SOB, N/V, dizziness, abdominal pain, diarrhea.  Objective: Vital signs in last 24 hours: Filed Vitals:   02/22/12 2051 02/22/12 2315 02/23/12 0400 02/23/12 0800  BP: 136/87 129/86 138/90 157/105  Pulse:  72 68 70  Temp: 98.6 F (37 C) 98.2 F (36.8 C) 98 F (36.7 C) 98.7 F (37.1 C)  TempSrc: Oral Oral Oral Oral  Resp:  32 22 23  Height:      Weight:      SpO2:  91% 95% 90%   Weight change:   Intake/Output Summary (Last 24 hours) at 02/23/12 0847 Last data filed at 02/23/12 [id number]  Gross per 24 hour  Intake   1385 ml  Output   1965 ml  Net   -580 ml  General: sitting up in bed, NAD  HEENT: R pupil irregular shape (prior cataract surgery), MMM of OP, EOMI  Cardiac: RRR, no rubs, murmurs or gallops  Pulm: good air movement, clear to auscultation bilaterally, no wheezes, rales, or rhonchi  Abd: mildly distended, mild TTP of RUQ, no rebound or guarding. Normoactive BS  Ext: warm and well perfused, no pedal edema  Neuro:  Alert and oriented X3  CN2-12 intact  Sensation intact face, b/l upper and lower extremities  Strength 5/5 face bilateral upper and lower extremities    Lab Results: Basic Metabolic Panel:  Lab [id number] 02/23/12 0304 02/22/12 2026 02/22/12 1424  NA -- 131* 129* --  K -- 4.9 5.3* --  CL -- 93* 90* --  CO2 -- 23 21 --  GLUCOSE -- 113* 102* --  BUN -- 66* 64* --  CREATININE -- 8.52* 8.16* --  CALCIUM -- 6.4* 6.8* --  MG 1.8 -- -- --  PHOS -- 7.7* -- 7.1*   Liver Function Tests:  Lab 02/23/12 0304 02/22/12 1424 02/22/12 0455 02/21/12 0817  AST -- -- 3409* 4594*  ALT -- -- 1529* 1638*  ALKPHOS -- -- 107 109  BILITOT -- -- 0.8 0.8  PROT -- -- 6.0 6.0  ALBUMIN 2.4* 2.4* -- --   CBC:  Lab 02/23/12 0304 02/22/12 1425 02/22/12 0455 02/19/12 2308   WBC 7.7 -- 8.5 --  NEUTROABS -- -- -- 9.6*  HGB 11.8* -- 12.5* --  HCT 35.2* -- 37.4* --  MCV 89.6 -- 89.5 --  PLT 78* 77* -- --   Cardiac Enzymes:  Lab 02/23/12 0309 02/22/12 2026 02/22/12 1424  CKTOTAL 29562* >50000* >50000*  CKMB 32.2* 42.5* 51.6*  CKMBINDEX -- -- --  TROPONINI 0.48* 0.54* 0.52*   D-Dimer:  Lab 02/22/12 1425  DDIMER 7.68*   CBG:  Lab 02/23/12 0808 02/22/12 2152 02/22/12 1625 02/22/12 1227 02/22/12 0730 02/22/12 0345  GLUCAP 101* 96 127* 102* 113* 112*   Hemoglobin A1C:  Lab 02/20/12 0640  HGBA1C 5.8*   Fasting Lipid Panel:  Lab 02/20/12 0640  CHOL 245*  HDL 55  LDLCALC 121*  TRIG 345*  CHOLHDL 4.5  LDLDIRECT --   Thyroid Function Tests:  Lab 02/22/12 1141  TSH 3.160  T4TOTAL --  FREET4 --  T3FREE --  THYROIDAB --   Coagulation:  Lab 02/22/12 1425 02/22/12 0455 02/19/12 2308  LABPROT 14.6 14.6 15.0  INR 1.16 1.16 1.20   Urine Drug Screen: Drugs of Abuse     Component Value  Date/Time   LABOPIA NONE DETECTED 02/20/2012 0105   COCAINSCRNUR NONE DETECTED 02/20/2012 0105   LABBENZ NONE DETECTED 02/20/2012 0105   AMPHETMU NONE DETECTED 02/20/2012 0105   THCU NONE DETECTED 02/20/2012 0105   LABBARB NONE DETECTED 02/20/2012 0105    Alcohol Level:  Lab 02/19/12 2321  ETH 105*   Urinalysis:  Lab 02/20/12 0104  COLORURINE AMBER*  LABSPEC 1.021  PHURINE 5.0  GLUCOSEU NEGATIVE  HGBUR LARGE*  BILIRUBINUR NEGATIVE  KETONESUR NEGATIVE  PROTEINUR >300*  UROBILINOGEN 0.2  NITRITE NEGATIVE  LEUKOCYTESUR TRACE*    Micro Results: Recent Results (from the past 240 hour(s))  URINE CULTURE     Status: Normal   Collection Time   02/21/12 10:15 AM      Component Value Range Status Comment   Specimen Description URINE, CATHETERIZED   Final    Special Requests NONE   Final    Culture  Setup Time 02/21/2012 21:40   Final    Colony Count NO GROWTH   Final    Culture NO GROWTH   Final    Report Status 02/22/2012 FINAL   Final   MRSA  PCR SCREENING     Status: Normal   Collection Time   02/21/12 12:58 PM      Component Value Range Status Comment   MRSA by PCR NEGATIVE  NEGATIVE Final    Studies/Results: US Abdomen Complete  02/21/2012  *RADIOLOGY REPORT*  Clinical Data:  57 year old male with elevated LFTs.  History of hepatitis C.  ABDOMINAL ULTRASOUND COMPLETE  Comparison:  None  Findings:  Gallbladder:  The gallbladder is unremarkable. There is no evidence of gallstones, gallbladder wall thickening, or pericholecystic fluid.  Common Bile Duct: There is no evidence of intrahepatic biliary dilatation. The CBD is dilated measuring 11 mm in greatest diameter.  No definite CBD filling defects are noted.  Liver: Diffuse increased echogenicity and heterogeneity of the liver is noted. There may be minimal nodularity of the hepatic contour suggestive of cirrhosis.  No definite focal hepatic lesions are identified.  IVC:  Appears normal.  Pancreas:  Although the pancreas is difficult to visualize in its entirety, no focal pancreatic abnormality is identified.  Spleen:  Within normal limits in size and echotexture.  Cysts within the spleen are noted.  Right kidney:  The right kidney is normal in size and parenchymal echogenicity.  There is no evidence of solid mass, hydronephrosis or definite renal calculi.  The right kidney measures 13.4 cm.  Left kidney:  The left kidney is normal in size and parenchymal echogenicity.  There is no evidence of solid mass, hydronephrosis or definite renal calculi.   The left kidney measures 13.2 cm.  Abdominal Aorta:  No abdominal aortic aneurysm identified.  The entire abdominal aorta is not well visualized secondary to overlying bowel gas.  There is no evidence of ascites.  IMPRESSION: Dense slightly heterogeneous liver with morphologic suggestive of cirrhosis.  Mild CBD dilatation - 11 mm.  Consider further evaluation.   Original Report Authenticated By: Margarette Canada, M.D.    Medications: I have reviewed the  patient's current medications. Scheduled Meds:   . aspirin  325 mg Oral Daily  . cefTRIAXone (ROCEPHIN)  IV  1 g Intravenous Q24H  . folic acid  1 mg Oral Daily  . furosemide  80 mg Intravenous BID  . multivitamin with minerals  1 tablet Oral Daily  . thiamine  100 mg Oral Daily  . traZODone  100 mg Oral QHS  Continuous Infusions:   .  sodium bicarbonate  infusion 1000 mL 175 mL/hr at 02/23/12 0300   PRN Meds:.LORazepam, LORazepam, ondansetron (ZOFRAN) IV, oxyCODONE  Assessment/Plan:  Mr. Duren is 57 y.o. male with a pmh of schizophrenia, HTN, Hep C, h/o polysubstance abuse (cocaine/EtOH) admitted with confusion and found to have multiple cerebellar infarcts, rhabdomyolysis, and acute renal failure and transaminitis.   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  - LE dopplers pending. If +DVTs, will need anticoagulation. If no clots visualized, will need to continue antiplatelet therapy.  - Continue ASA 325mg  for secondary stroke prophylaxis  - A1C 5.8 --> nondiabetic  - LDL 121--> statin therapy contraindicated w transaminitis and rhabdo  -PT/OT: prescreening for CIR, CIR consult pending  2) Acute renal failure  Patient presents with ARF. Baseline Cr is around 1.2-1.3. Cr on admission 2.4, trending up daily and is now 8.5.  UA on admission notable for granular and RBC casts. Suspect pigment-induced renal failure in setting of rhabdo w CK >50,000. May also be ATN from hypotension, ACEi/NSAIDS vs AIN vs glomerulonephritis. UOP has improved over the past 24 hours (>50cc/hr) on lasix BID, bicarb drip, and mutliple 500cc boluses (see #4 below). No urgent indication for HD now. K is normal at 4.9, no evidence of volume overload/uremic encephalopathy.   - Renal following, appreciate input. Bicarb drip and BID lasix for rhabdo (see #4 below) started yesterday -  Pending labs: urine eos, ANA, ANCA, GMB, ASA, C3, C4, cryglobulin  - follow urine output    Lab Results  Component Value Date   CREATININE 8.52* 02/23/2012    3) Transaminitis  Patient with transaminitis, preserved synthetic function of liver. Acute hepatitis and HIV negative. Chronic Hep C. Negative tylenol level. Negative ANA. Abdominal ultrasound demonstrates dense hepatic architecture suspicious for cirrhosis, no evidence of infarction. CBD also enlarged at 11 without clear etiology, no gall stones.  Suspect multifactorial : rhabdo, chronic + binge EtOH use, and shock liver in setting of hypotension. Transaminases continue to be markedly elevated by are downtrending. GI following.  - Continue to follow transaminases. Liver panel pending this am. - Expect Bili uptrend to lag behind and injury resolves  - Will need nonurgent MRI abd+MRCP, per GI   Lab Results  Component Value Date   ALT 1529* 02/22/2012   AST 3409* 02/22/2012   ALKPHOS 107 02/22/2012   BILITOT 0.8 02/22/2012    4) Rhabdomyolysis  Patient's CK above upper limits normal >50,000. Etiology not clear. No prolonged immobilization. UDS negative for cocaine, although has h/o use. Not on statin therapy, has been on fenofibrate for hyperTG, but rhabdo would be rare SE. Is chronic drinker with frequent binging and EtOH level 105 on admission. Denies any symptoms of antecedent viral illness prior to admission on our questioning, although Dr. Barkley Bruns note documents endorsement of 1 week N/V/D prior to admission.  No hyperkalemia today (K 4.9). CK has finally started to downtrend from too high to measure --> 48,000 - Continue Bicarb drip (152mEq @ 175 cc/hr), Lasix (80IV BID)  - Has had eight 500cc boluses and tolerated them well over past 24 hours.  Needs aggressive IV hydration in setting of EF 30-35%  - Calcium 6.4, corrects to 7.7 with low albumin. Likely related to rhabdo. Per renal, no treatment now.  - Follow uric acid,  phosophorous  5) Thrombocytopenia  Platelets decreasing since  admission. (160-->78). Has been getting enoxaparin for DVT prophylaxis. No active bleeding. 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  DIC panel nonspecific, no active evidence of platelet consumption.  - SCDs for DVT prophy - If LE dopplers reveal DVT, will need further anticoagulation   6) Anion Gap Metabolic Acidosis  AG is decreasing (15 today), with bicarbonate of 23. Likely in setting of renal failure. No ketonuria.  - Continue to monitor daily   7) Congestive heart failure w EF 30-35% Patient noted to have poor EF on echo, no previous dx CHF. Dr. Einar Gip with Cardiology saw him yesterday, would consider adding hydralazine 25 mg by mouth 3 times a day along with low dose beta blocker coreg 3.125 mg PO bid, although patient is presently in well compensated congestive heart failure. Not a candidate for ACEi or ARB due to ARF.   Patient with mild increase in troponin 0.21-->0.42-->0.54--> 0.48.  No EKG changes to suggest ischemia. Suspect related to ARF, no obvious ACS. Receiving ASA for stroke.  - Add coreg, hydralazine today  8) ? UTI  Trace leuks and cloudy urine. Had leukocytosis on admission which has resolved. No organism on culture - Stopping ceftriaxone therapy today  9) EtOH abuse  On CIWA.   10) Hypertension  Had on episode of hypotension during admission but now hypertensive w BPs 150s/100s. Not a candidate for ACEi or ARB due to ARF.   - per Dr. Irven Shelling recommendation, will add hydralazine TID and low dose coreg.   Dispo: CIR consult pending  The patient does  have a current PCP ([facility]), therefore will be requiring [facility] follow-up after discharge.   The patient does not have transportation limitations that hinder transportation to clinic appointments.  Services Needed at time of discharge: Y = Yes, Blank = No PT:   OT:   RN:   Equipment:   Other:      LOS: 4 days   Tonia Brooms 02/23/2012, 8:47 AM

## 2012-02-24 DIAGNOSIS — R112 Nausea with vomiting, unspecified: Secondary | ICD-10-CM

## 2012-02-24 DIAGNOSIS — I10 Essential (primary) hypertension: Secondary | ICD-10-CM

## 2012-02-24 DIAGNOSIS — G8110 Spastic hemiplegia affecting unspecified side: Secondary | ICD-10-CM

## 2012-02-24 DIAGNOSIS — I6330 Cerebral infarction due to thrombosis of unspecified cerebral artery: Secondary | ICD-10-CM

## 2012-02-24 DIAGNOSIS — R197 Diarrhea, unspecified: Secondary | ICD-10-CM

## 2012-02-24 DIAGNOSIS — I69921 Dysphasia following unspecified cerebrovascular disease: Secondary | ICD-10-CM

## 2012-02-24 LAB — RENAL FUNCTION PANEL
Albumin: 2.5 g/dL — ABNORMAL LOW (ref 3.5–5.2)
BUN: 76 mg/dL — ABNORMAL HIGH (ref 6–23)
CO2: 21 meq/L (ref 19–32)
Calcium: 6.6 mg/dL — ABNORMAL LOW (ref 8.4–10.5)
Chloride: 95 meq/L — ABNORMAL LOW (ref 96–112)
Creatinine, Ser: 9.54 mg/dL — ABNORMAL HIGH (ref 0.50–1.35)
GFR calc Af Amer: 6 mL/min — ABNORMAL LOW
GFR calc non Af Amer: 5 mL/min — ABNORMAL LOW
Glucose, Bld: 100 mg/dL — ABNORMAL HIGH (ref 70–99)
Phosphorus: 8.9 mg/dL — ABNORMAL HIGH (ref 2.3–4.6)
Potassium: 4.9 meq/L (ref 3.5–5.1)
Sodium: 136 meq/L (ref 135–145)

## 2012-02-24 LAB — GLUCOSE, CAPILLARY
Glucose-Capillary: 135 mg/dL — ABNORMAL HIGH (ref 70–99)
Glucose-Capillary: 104 mg/dL — ABNORMAL HIGH (ref 70–99)
Glucose-Capillary: 108 mg/dL — ABNORMAL HIGH (ref 70–99)
Glucose-Capillary: 104 mg/dL — ABNORMAL HIGH (ref 70–99)
Glucose-Capillary: 112 mg/dL — ABNORMAL HIGH (ref 70–99)

## 2012-02-24 LAB — CK TOTAL AND CKMB (NOT AT ARMC)
CK, MB: 18.4 ng/mL (ref 0.3–4.0)
Relative Index: 0.1 (ref 0.0–2.5)

## 2012-02-24 LAB — HEPATIC FUNCTION PANEL
ALT: 599 U/L — ABNORMAL HIGH (ref 0–53)
AST: 475 U/L — ABNORMAL HIGH (ref 0–37)
Albumin: 2.5 g/dL — ABNORMAL LOW (ref 3.5–5.2)
Total Protein: 6 g/dL (ref 6.0–8.3)

## 2012-02-24 LAB — UIFE/LIGHT CHAINS/TP QN, 24-HR UR
Albumin, U: DETECTED
Alpha 1, Urine: DETECTED — AB
Beta, Urine: DETECTED — AB
Free Kappa Lt Chains,Ur: 39.2 mg/dL — ABNORMAL HIGH (ref 0.14–2.42)
Free Lambda Lt Chains,Ur: 15.1 mg/dL — ABNORMAL HIGH (ref 0.02–0.67)
Gamma Globulin, Urine: DETECTED — AB

## 2012-02-24 LAB — TROPONIN I: Troponin I: 0.5 ng/mL (ref <0.30)

## 2012-02-24 MED ORDER — SODIUM CHLORIDE 0.9 % IV BOLUS (SEPSIS): 500.0000 mL | INTRAVENOUS | Status: DC

## 2012-02-24 MED ORDER — SODIUM CHLORIDE 0.9 % IV BOLUS (SEPSIS)
500.0000 mL | Freq: Three times a day (TID) | INTRAVENOUS | Status: DC
  Administered 2012-02-24: 500 mL via INTRAVENOUS

## 2012-02-24 MED ORDER — ISOSORB DINITRATE-HYDRALAZINE 20-37.5 MG PO TABS
1.0000 | ORAL_TABLET | Freq: Three times a day (TID) | ORAL | Status: DC
  Administered 2012-02-24 – 2012-02-29 (×17): 1 via ORAL
  Filled 2012-02-24 (×18): qty 1

## 2012-02-24 MED ORDER — SODIUM CHLORIDE 0.9 % IV BOLUS (SEPSIS)
500.0000 mL | INTRAVENOUS | Status: AC
  Administered 2012-02-24 – 2012-02-25 (×6): 500 mL via INTRAVENOUS

## 2012-02-24 NOTE — Progress Notes (Signed)
Speech Language Pathology Dysphagia Treatment Patient Details Name: [name] [name] MRN: [id number] DOB: 05/13/1955 Today's Date: 02/24/2012 Time: 0915-0930 SLP Time Calculation (min): 15 min  Assessment / Plan / Recommendation Clinical Impression  Pt again observed with thin liquids, pills,  and regular consistency foods without any evidence of asrpiation or difficutly. Discussed risk of pocketing with pt, he reports he feels residual and clears, though he says food only gets stuck due to dentures. No SLP f/u needed at this time, will sign off.     Diet Recommendation  Continue with Current Diet: Regular;Thin liquid    SLP Plan Discharge SLP treatment due to (comment)   Pertinent Vitals/Pain NA   Swallowing Goals  SLP Swallowing Goals Patient will consume recommended diet without observed clinical signs of aspiration with: Set-up;Modified independent assistance Swallow Study Goal #1 - Progress: Met  General Temperature Spikes Noted: No Behavior/Cognition: Alert;Cooperative Oral Cavity - Dentition: Dentures, top Patient Positioning: Upright in chair  Oral Cavity - Oral Hygiene Does patient have any of the following "at risk" factors?: None of the above Brush patient's teeth BID with toothbrush (using toothpaste with fluoride): Yes   Dysphagia Treatment Treatment focused on: Skilled observation of diet tolerance Treatment Methods/Modalities: Skilled observation Patient observed directly with PO's: Yes Type of PO's observed: Thin liquids;Regular Feeding: Able to feed self Liquids provided via: Erie Benz [place], MA CCC-SLP [phone]  Lynann Beaver 02/24/2012, 9:39 AM

## 2012-02-24 NOTE — Progress Notes (Addendum)
I met with patient at bedside. Discussed inpt rehab venue as an option for his rehab. He is in agreement. I will begin insurance approval to admit. ? Tomorrow if medical workup is complete. I will contact MD to clarify. [id number] Will follow his renal function to assist in clarifying when pt medically ready for CIR also.

## 2012-02-24 NOTE — Progress Notes (Signed)
Physical Therapy Treatment Patient Details Name: [name] [name] MRN: [id number] DOB: 12-08-1955 Today's Date: 02/24/2012 Time: [id number] PT Time Calculation (min): 14 min  PT Assessment / Plan / Recommendation Comments on Treatment Session  Pt very motivated to get moving and willing to ambulate.  Limited session due to technician present to perform dopplers.  Pt able to ambulate this session with +2 for safety and lines.      Follow Up Recommendations  CIR     Equipment Recommendations  Rolling walker with 5" wheels    Recommendations for Other Services Rehab consult  Frequency Min 4X/week   Plan Discharge plan remains appropriate;Frequency remains appropriate    Precautions / Restrictions Precautions Precautions: Fall Restrictions Weight Bearing Restrictions: No   Pertinent Vitals/Pain C/o occasional left  Hip pain but does not rate    Mobility  Bed Mobility Bed Mobility: Sit to Supine Sit to Supine: 4: Min assist Details for Bed Mobility Assistance: (A) to elevate LE into bed with cues for proper technique Transfers Transfers: Sit to Stand;Stand to Sit Sit to Stand: 4: Min assist;From chair/3-in-1 Stand to Sit: 4: Min assist;To bed Details for Transfer Assistance: (A) to initiate transfer and slowly descend to bed with max cues for hand placement and proper placement Ambulation/Gait Ambulation/Gait Assistance: 1: +2 Total assist Ambulation/Gait: Patient Percentage: 70% Ambulation Distance (Feet): 20 Feet Assistive device: Rolling walker Ambulation/Gait Assistance Details: +2 (A) to maintain balance and lines with cues for proper step seqence. Gait Pattern: Step-to pattern;Decreased stride length;Shuffle;Trunk flexed Gait velocity: decreased Stairs: No Modified Rankin (Stroke Patients Only) Pre-Morbid Rankin Score: No symptoms Modified Rankin: Moderately severe disability     PT Diagnosis:    PT Problem List:   PT Treatment Interventions:     PT Goals Acute  Rehab PT Goals PT Goal Formulation: With patient Time For Goal Achievement: 03/02/12 Potential to Achieve Goals: Good Pt will go Supine/Side to Sit: with supervision Pt will go Sit to Supine/Side: with min assist PT Goal: Sit to Supine/Side - Progress: Met Pt will go Sit to Stand: with supervision PT Goal: Sit to Stand - Progress: Progressing toward goal Pt will go Stand to Sit: with supervision PT Goal: Stand to Sit - Progress: Progressing toward goal Pt will Stand: with supervision;3 - 5 min;with unilateral upper extremity support PT Goal: Stand - Progress: Progressing toward goal Pt will Ambulate: >150 feet;with least restrictive assistive device;with supervision PT Goal: Ambulate - Progress: Progressing toward goal  Visit Information  Last PT Received On: 02/24/12 Assistance Needed: +2 (safety & lines) PT/OT Co-Evaluation/Treatment: Yes    Subjective Data  Subjective: "I like yall.  Are you coming back tomorrow?" Patient Stated Goal: To get up and move today.   Cognition  Overall Cognitive Status: Appears within functional limits for tasks assessed/performed Arousal/Alertness: Awake/alert Behavior During Session: [facility] for tasks performed    Balance  Balance Balance Assessed: Yes Static Standing Balance Static Standing - Balance Support: Bilateral upper extremity supported Static Standing - Level of Assistance: 5: Stand by assistance Static Standing - Comment/# of Minutes: Minguard for safety with cues for upright posture  End of Session PT - End of Session Equipment Utilized During Treatment: Gait belt (tech in room for dopplers) Activity Tolerance: Patient tolerated treatment well Patient left: in bed;with call bell/phone within reach Nurse Communication: Mobility status   GP     Kristan Votta 02/24/2012, 11:34 AM Antoine Poche, PT DPT [phone]

## 2012-02-24 NOTE — Progress Notes (Signed)
Subjective: No nausea, abdominal pain, or confusion. Eating breakfast. Says he feels much better Improved UOP with 6L out over past 24 hours. Denies CP, SOB, N/V, dizziness, abdominal pain, diarrhea.  Objective: Vital signs in last 24 hours: Filed Vitals:   02/24/12 0440 02/24/12 0643 02/24/12 0725 02/24/12 0830  BP: 148/93 142/82 146/82   Pulse: 74  71 69  Temp: 98.1 F (36.7 C)  98.8 F (37.1 C)   TempSrc: Oral  Oral   Resp: 20     Height:      Weight:      SpO2: 99%  96% 91%   Weight change:   Intake/Output Summary (Last 24 hours) at 02/24/12 1056 Last data filed at 02/24/12 1031  Gross per 24 hour  Intake 6364.58 ml  Output   6300 ml  Net  64.58 ml  General: sitting up iin chair, NAD  HEENT: R pupil irregular shape (prior cataract surgery), MMM of OP, EOMI  Cardiac: RRR, no rubs, murmurs or gallops  Pulm: good air movement, clear to auscultation bilaterally; fine bibasilar rales Abd: mildly distended, no TTP this morning, no rebound or guarding. Normoactive BS  Ext: warm and well perfused, no pedal edema  Neuro:  Alert and oriented X3  CN2-12 intact  Sensation intact face, b/l upper and lower extremities  Strength 5/5 face bilateral upper and lower extremities    Lab Results: Basic Metabolic Panel:  Lab [id number] 02/23/12 0845 02/23/12 0309  NA 136 134* --  K 4.9 5.1 --  CL 95* 94* --  CO2 21 21 --  GLUCOSE 100* 109* --  BUN 76* 67* --  CREATININE 9.54* 8.85* --  CALCIUM 6.6* 6.6* --  MG -- -- 1.8  PHOS 8.9* 8.2* --   Liver Function Tests:  Lab 02/24/12 0445 02/24/12 0441 02/23/12 0839  AST -- 475* 1016*  ALT -- 599* 927*  ALKPHOS -- 128* 132*  BILITOT -- 0.7 1.1  PROT -- 6.0 6.4  ALBUMIN 2.5* 2.5* --   CBC:  Lab 02/23/12 0304 02/22/12 1425 02/22/12 0455 02/19/12 2308  WBC 7.7 -- 8.5 --  NEUTROABS -- -- -- 9.6*  HGB 11.8* -- 12.5* --  HCT 35.2* -- 37.4* --  MCV 89.6 -- 89.5 --  PLT 78* 77* -- --   Cardiac Enzymes:  Lab 02/24/12  0445 02/23/12 1849 02/23/12 0845  CKTOTAL 03474* 35345* 44463*  CKMB 18.4* 23.1* 31.2*  CKMBINDEX -- -- --  TROPONINI 0.50* 0.64* 0.62*   D-Dimer:  Lab 02/22/12 1425  DDIMER 7.68*   CBG:  Lab 02/24/12 0816 02/23/12 2233 02/23/12 1643 02/23/12 1300 02/23/12 0808 02/22/12 2152  GLUCAP 104* 122* 107* 94 101* 96   Hemoglobin A1C:  Lab 02/20/12 0640  HGBA1C 5.8*   Fasting Lipid Panel:  Lab 02/20/12 0640  CHOL 245*  HDL 55  LDLCALC 121*  TRIG 345*  CHOLHDL 4.5  LDLDIRECT --   Thyroid Function Tests:  Lab 02/22/12 1141  TSH 3.160  T4TOTAL --  FREET4 --  T3FREE --  THYROIDAB --   Coagulation:  Lab 02/22/12 1425 02/22/12 0455 02/19/12 2308  LABPROT 14.6 14.6 15.0  INR 1.16 1.16 1.20   Urine Drug Screen: Drugs of Abuse     Component Value Date/Time   LABOPIA NONE DETECTED 02/20/2012 0105   COCAINSCRNUR NONE DETECTED 02/20/2012 0105   LABBENZ NONE DETECTED 02/20/2012 0105   AMPHETMU NONE DETECTED 02/20/2012 0105   THCU NONE DETECTED 02/20/2012 0105   LABBARB NONE DETECTED 02/20/2012  0105    Alcohol Level:  Lab 02/19/12 2321  ETH 105*   Urinalysis:  Lab 02/20/12 0104  COLORURINE AMBER*  LABSPEC 1.021  PHURINE 5.0  GLUCOSEU NEGATIVE  HGBUR LARGE*  BILIRUBINUR NEGATIVE  KETONESUR NEGATIVE  PROTEINUR >300*  UROBILINOGEN 0.2  NITRITE NEGATIVE  LEUKOCYTESUR TRACE*    Micro Results: Recent Results (from the past 240 hour(s))  URINE CULTURE     Status: Normal   Collection Time   02/21/12 10:15 AM      Component Value Range Status Comment   Specimen Description URINE, CATHETERIZED   Final    Special Requests NONE   Final    Culture  Setup Time 02/21/2012 21:40   Final    Colony Count NO GROWTH   Final    Culture NO GROWTH   Final    Report Status 02/22/2012 FINAL   Final   MRSA PCR SCREENING     Status: Normal   Collection Time   02/21/12 12:58 PM      Component Value Range Status Comment   MRSA by PCR NEGATIVE  NEGATIVE Final     Studies/Results: No results found. Medications: I have reviewed the patient's current medications. Scheduled Meds:    . aspirin  325 mg Oral Daily  . calcium carbonate  400 mg of elemental calcium Oral TID WC  . carvedilol  3.125 mg Oral BID WC  . folic acid  1 mg Oral Daily  . furosemide  80 mg Intravenous QID  . hydrALAZINE  25 mg Oral Q8H  . multivitamin with minerals  1 tablet Oral Daily  . sodium chloride  500 mL Intravenous Q8H  . sodium chloride      . thiamine  100 mg Oral Daily  . traZODone  100 mg Oral QHS   Continuous Infusions:    .  sodium bicarbonate  infusion 1000 mL 125 mL/hr at 02/24/12 1030   PRN Meds:.ondansetron (ZOFRAN) IV, oxyCODONE  Assessment/Plan:  Mr. Verhaeghe is 57 y.o. male with a pmh of schizophrenia, HTN, Hep C, h/o polysubstance abuse (cocaine/EtOH) admitted with confusion and found to have multiple cerebellar infarcts, rhabdomyolysis, and acute renal failure and transaminitis.   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 LE dopplers negative for DVT - Continue ASA 325mg  for secondary stroke prophylaxis  - A1C 5.8 --> nondiabetic  - LDL 121--> statin therapy contraindicated w transaminitis and rhabdo  -PT/OT: prescreening for CIR, CIR consult pending  2) Acute renal failure  Patient presents with ARF. Baseline Cr is around 1.2-1.3. Cr on admission 2.4, trending up daily and is now 9.5 w BUN 76.  UA on admission notable for granular and RBC casts. Suspect pigment-induced renal failure in setting of rhabdo w CK >50,000. May also be ATN from hypotension, ACEi/NSAIDS vs AIN vs glomerulonephritis. UOP continues to improve on lasix BID, bicarb drip, and mutliple 500cc boluses (see #4 below). No urgent indication for HD now. K is normal at 4.9, no evidence of volume overload/uremic encephalopathy.   - Renal following,  appreciate input. Bicarb drip and QID lasix for rhabdo (see #4 below) started yesterday - Negative ANA, ANCA, GBM, C3, C4, SPEP - Pending labs: cryoglobulin  - follow urine output    Lab Results  Component Value Date   CREATININE 9.54* 02/24/2012    3) Transaminitis  Patient with transaminitis, preserved synthetic function of liver.  Acute hepatitis and HIV negative. Chronic Hep C. Negative tylenol level. Negative ANA. Abdominal ultrasound demonstrates dense hepatic architecture suspicious for cirrhosis, no evidence of infarction. CBD also enlarged at 11 without clear etiology, no gall stones.  Suspect multifactorial : rhabdo, chronic + binge EtOH use, and shock liver in setting of hypotension. Transaminases continue to be markedly elevated by are downtrending. GI following.  - Continue to follow transaminases. Much improvement.- Will need nonurgent MRI abd+MRCP, per GI   Lab Results  Component Value Date   ALT 599* 02/24/2012   AST 475* 02/24/2012   ALKPHOS 128* 02/24/2012   BILITOT 0.7 02/24/2012    4) Rhabdomyolysis  On initial testing, patient's CK above upper limits normal >50,000. Etiology not clear. No prolonged immobilization. UDS negative for cocaine, although has h/o use. Not on statin therapy, has been on fenofibrate for hyperTG, but rhabdo would be rare SE. Is chronic drinker with frequent binging and EtOH level 105 on admission. Denies any symptoms of antecedent viral illness prior to admission on our questioning, although Dr. Barkley Bruns note documents endorsement of 1 week N/V/D prior to admission.  No hyperkalemia today (K 4.9). CK continues to downtrend from too high to measure --> 32,000 - Continue Bicarb drip (167mEq @ 175 cc/hr), Lasix (80IV QID)  - Has had multiple 500cc boluses and tolerated them well over past 24 hours.  Needs aggressive IV hydration in setting of EF 30-35%. Will continue w 500cc boluses q 3 hours. - Calcium 6.6, corrects to 7.8 with low albumin. Likely  related to rhabdo and loop diuresis. Continue tums.  - Follow uric acid, phosphorous Lab Results  Component Value Date   CKTOTAL 29562* 02/24/2012   CKMB 18.4* 02/24/2012   TROPONINI 0.50* 02/24/2012    5) Thrombocytopenia  Platelets decreasing since admission. (160-->78). 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  DIC panel nonspecific, no active evidence of platelet consumption.  - SCDs for DVT prophy    6) Anion Gap Metabolic Acidosis  AG is 20 today, with bicarbonate of 21. Likely in setting of renal failure. No ketonuria.  - Continue to monitor daily   7) Congestive heart failure w EF 30-35% Patient noted to have poor EF on echo, no previous dx CHF. Dr. Einar Gip with Cardiology following, recommends Bidil 3 times a day along with low dose beta blocker coreg 3.125 mg PO bid, although patient is presently in well compensated congestive heart failure. Not a candidate for ACEi or ARB due to ARF.   Patient with mild increase in troponin. No EKG changes to suggest ischemia. Suspect related to ARF, no obvious ACS. Receiving ASA for stroke.  - Coreg, bidil  8) ? UTI  Trace leuks and cloudy urine. Had leukocytosis on admission which has resolved. No organism on culture. Received abx therapy with ceftriaxone for several days, was dis  9) EtOH abuse  On CIWA.   10) Hypertension  Had on episode of hypotension during admission but now hypertensive. Not a candidate for ACEi or ARB due to ARF.   - bidil TID and coreg.   Dispo: CIR consult pending  The patient does  have a current PCP ([facility]), therefore will be requiring [facility] follow-up after discharge.   The patient does not have transportation limitations that hinder transportation to clinic appointments.  Services Needed at time of discharge: Y = Yes, Blank = No PT:   OT:   RN:   Equipment:   Other:  LOS: 5 days   Tonia Brooms 02/24/2012, 10:56 AM

## 2012-02-24 NOTE — Progress Notes (Signed)
[facility]  Subjective:  Reports intermittent nausea, no vomiting. Some occasional shortness of breath. Not uremic at all, says he feels better since he has been here   Objective: Vital signs in last 24 hours: Blood pressure 142/82, pulse 74, temperature 98.8 F (37.1 C), temperature source Oral, resp. rate 20, height 6' (1.829 m), weight 265 lb 6.9 oz (120.4 kg), SpO2 99.00%.    PHYSICAL EXAM Gen: NAD, laying in bed comfortably Chest: CTA b/l Cardio: regular rate and rhythm, S1, S2 normal, no murmur GI: soft, non-tender; bowel sounds normal; distended  Extremities: no edema Neurologic: No focal deficit  Lab Results:   Lab 02/24/12 0445 02/23/12 0845 02/23/12 0304  NA 136 134* 131*  K 4.9 5.1 4.9  CL 95* 94* 93*  CO2 21 21 23   BUN 76* 67* 66*  CREATININE 9.54* 8.85* 8.52*  ALB -- -- --  GLUCOSE 100* -- --  CALCIUM 6.6* 6.6* 6.4*  PHOS 8.9* 8.2* 7.7*     Basename 02/23/12 0304 02/22/12 1425 02/22/12 0455  WBC 7.7 -- 8.5  HGB 11.8* -- 12.5*  HCT 35.2* -- 37.4*  PLT 78* 77* --     Scheduled Meds:    . aspirin  325 mg Oral Daily  . calcium carbonate  400 mg of elemental calcium Oral TID WC  . carvedilol  3.125 mg Oral BID WC  . folic acid  1 mg Oral Daily  . furosemide  80 mg Intravenous QID  . hydrALAZINE  25 mg Oral Q8H  . multivitamin with minerals  1 tablet Oral Daily  . sodium chloride      . thiamine  100 mg Oral Daily  . traZODone  100 mg Oral QHS   Continuous Infusions:    .  sodium bicarbonate  infusion 1000 mL 175 mL/hr at 02/23/12 2248   PRN Meds:.LORazepam, LORazepam, ondansetron (ZOFRAN) IV, oxyCODONE  Assessment/Plan: Pt is a 57 y.o. yo male with a PMHx of schizophrenia, HTN, Hep C, h/o polysubstance abuse who was admitted to [facility] on 02/19/2012 for evaluation of altered mental status with confusion and slurred speech, found to have cerebellar stroke on MRI   # Acute renal failure: Creatinine on admission: 2.34 to 8.52  today. Baseline Cr: 1.2 on 02/08/12. Acute renal failure likely multifactorial from ATN from hypotension, ACEI and NSAID use along with rhabdomyolysis kidney injury. He is nonoliguric. Lasix increased yesterday with subsequent negative net balance of 2.2L in 24hrs.  Creatinine and BUN continuing to steadily trend up.  - ANA, ANCA negative, GBM negative. C3,C4 negative. Cryoglobulin pending - abdominal US: no hydronephrosis.  - SPEP: no M spike, kappa/lambda ratio normal - Strict I's/Os  - continue lasix 80 IV qid. - may be needing dialysis if BUN continues to steadily increase. No obvious signs of uremia other than intermittent nausea at this point. Not uremic at all, will modify ivf to get a little less in to overall achieve slight outs greater than ins  # Rhabdomyolisis: on lasix and bicarb drip at 175 cc/hr. CK trending down.  - persistent hypocalcemia and hyperphosphatemia on tums. Consider increasing dose or switching to phoslo with meals.   - continue IV hydration and close monitoring of BMP and CK.  # Anion gap acidosis: likely from renal failure. stable on bicarb drip.  # Hypotension: resolved. Now hypertensive. recs for hydralazine and coreg by Dr. Nadyne Coombes with cardiology.  # CVA and altered mental status: followed by neurology. AMS improved.  # Transaminitis: GI consulted.  Known hepatitis C. Hep A and B negative. Tylenol<15. Ethanol:105. LFTs improving.  # UTI: urine culture: no growth. Ceftriaxone d/ced # Schizophrenia: per primary team  # Alcohol abuse: on CIWA with thiamine, folic acid and multivit.  # PFO: not currently anticoagulated given low platelet count  [name] [name], PGY-2  Family Medicine Resident     LOS: 5 days   [name] [name] 02/24/2012,8:26 AM  Patient seen and examined, agree with above note with above modifications. Nonoliguric AKI due to rhabdo.  BUN and creatinine still rising but able to achieve the "flushing" with IVF we desire.  Will decrease ins a  little to aim for outs slightly greater than ins.  I would like to leave foley in simply due to volume of urine he will continue to have.  The odds of him needing HD before this is said and done are probably about 50/50 at this point.   [name] Parish, MD 02/24/2012

## 2012-02-24 NOTE — Progress Notes (Signed)
Subjective:  Patient feels much better. His only complaint is Foley catheter irritability, physical therapist and occupational therapist had signed off stating elevated troponins, no specific complaints.  Objective:  Vital Signs in the last 24 hours: Temp:  [97.5 F (36.4 C)-98.8 F (37.1 C)] 98.8 F (37.1 C) (01/29 0725) Pulse Rate:  [67-81] 74  (01/29 0440) Resp:  [16-26] 20  (01/29 0440) BP: (140-157)/(82-94) 142/82 mmHg (01/29 0643) SpO2:  [94 %-99 %] 99 % (01/29 0440) Weight:  [120.4 kg (265 lb 6.9 oz)] 120.4 kg (265 lb 6.9 oz) (01/29 0322)  Intake/Output from previous day: 01/28 0701 - 01/29 0700 In: 3633 [I.V.:2625; IV Piggyback:1008] Out: 5875 [Urine:5875]  Physical Exam:  General appearance: alert, cooperative, appears older than stated age, no distress and moderately obese  Lungs: clear to auscultation bilaterally  Chest wall: no tenderness  Heart: S1, S2 normal, S4 present, no rub and no murmur  Abdomen: soft, non-tender; bowel sounds normal; no masses, no organomegaly and pannus present  Extremities: extremities normal, atraumatic, no cyanosis or edema  Pulses: 2+ and symmetric  Neurologic: Grossly normal   Lab Results:  Basename 02/23/12 0304 02/22/12 1425 02/22/12 0455  WBC 7.7 -- 8.5  HGB 11.8* -- 12.5*  PLT 78* 77* --    Basename 02/24/12 0445 02/23/12 0845  NA 136 134*  K 4.9 5.1  CL 95* 94*  CO2 21 21  GLUCOSE 100* 109*  BUN 76* 67*  CREATININE 9.54* 8.85*    Basename 02/24/12 0445 02/23/12 1849  TROPONINI 0.50* 0.64*   Hepatic Function Panel  Basename 02/24/12 0445 02/24/12 0441  PROT -- 6.0  ALBUMIN 2.5* --  AST -- 475*  ALT -- 599*  ALKPHOS -- 128*  BILITOT -- 0.7  BILIDIR -- 0.4*  IBILI -- 0.3  Lipid Panel     Component Value Date/Time   CHOL 245* 02/20/2012 0640   TRIG 345* 02/20/2012 0640   HDL 55 02/20/2012 0640   CHOLHDL 4.5 02/20/2012 0640   VLDL 69* 02/20/2012 0640   LDLCALC 121* 02/20/2012 0640    Assessment/Plan:    1. Chronic systolic and diastolic heart failure etiology for elevated S. troponins along with rhabdomyolysis. Sub endocardial ischemia and NSTEMI possible.   2. 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 Severe LV systolic dysfunction by echocardiogram.  3. Severe pulmonary hypertension probably induced by obesity and cardiomyopathy  4. Cerebral embolism with infarct without residual defect, paradoxical embolus cannot be excluded due to presence of PFO and pulmonary hypertension making it a probable pathway for paradoxical embolus. Strongly positive double contrast study for right to left shunting at rest during TEE.  5. Abnormal liver enzymes, rhabdomyolysis is the probable etiology. AST is more elevated than ALT again suggestive of probable not hepatic origin of the enzymes.  6.  Acute renal failure, severe probably due to rhabdomyolysis.  7. Mixed Hyperlipidemia.  8. 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 For compliance, we could certainly change to Bidil 1 by mouth 3 times a day. Otherwise no specific therapy from cardiac standpoint, I will see him back when necessary in the inpatient setting and I be certainly happy to follow the outpatient setting. Please call if questions. I do not see any contraindication from my standpoint for him to resume physical therapy and occupational therapy.   Laverda Page, M.D. 02/24/2012, 8:02 AM [facility] Pager: [phone] Office: [phone]  If no answer: [phone]

## 2012-02-24 NOTE — Consult Note (Signed)
Physical Medicine and Rehabilitation Consult Reason for Consult: CVA Referring Physician: Dr. Marinda Elk   HPI: [name] [name] is a 57 y.o. right-handed male with history of hypertension, schizophrenia polysubstance abuse and hepatitis C. Patient lives alone in independent prior to admission. Admitted 02/20/2012 with altered mental status, left-sided numbness and slurred speech. MRI of the brain showed small acute cerebellar infarcts right greater than left as well as questionable acute left external capsule and caudate nuclear punctate lacunar infarct. Echocardiogram with ejection fraction of 30% with diffuse hypokinesis. Patient did not receive TPA. TEE completed showing PFO with right to left shunt, ejection fraction 30%. Lower extremity Dopplers showed no signs of DVT. Neurology services was consulted and presently maintained on aspirin therapy for stroke prophylaxis. Noted admission creatinine of 2.34 with latest check creatinine 1.2 02/08/2012 elevated to 9.54 and renal services has been consulted. Acute renal failure likely multi-factorial from ATM from hypotension. Placed on intravenous Lasix and monitored. Urine drug screen upon admission was negative noted alcohol level 105. Findings of elevated liver function studies upon admission was noted reports of nausea and vomiting over the past week prior to admission. Abdominal ultrasound showed dense slightly heterogeneous liver with morphologic  suggestive of cirrhosis. Latest liver function panel 02/24/2012 with steady improvement. Patient is maintained on a regular consistency diet . Therapy evaluation completed 02/22/2012 noted patient needing some encouragement to participate also limited eval secondary to medical workup. Recommendations were made for physical medicine rehabilitation consult to consider inpatient rehabilitation services  Patient has not had physical therapy yesterday because of elevated lab values 4 troponin Has been cleared by  cardiology for full participation  Review of Systems  Gastrointestinal: Positive for nausea and vomiting.  Musculoskeletal: Positive for myalgias and back pain.  Neurological: Positive for speech change.  Psychiatric/Behavioral:       Schizophrenia  All other systems reviewed and are negative.   Past Medical History  Diagnosis Date  . Hypertension   . Schizophrenia   . Hepatitis C   . HIT (heparin-induced thrombocytopenia)   . H/O: alcohol abuse   . H/O cocaine abuse    Past Surgical History  Procedure Date  . Back surgery    Family History  Problem Relation Age of Onset  . CAD Mother   . CAD Father   . CAD Sister    Social History:  reports that he has never smoked. He does not have any smokeless tobacco history on file. He reports that he drinks alcohol. He reports that he does not use illicit drugs. Allergies: No Known Allergies Medications Prior to Admission  Medication Sig Dispense Refill  . cloNIDine (CATAPRES) 0.3 MG tablet Take 0.3 mg by mouth 2 (two) times daily.      . furosemide (LASIX) 20 MG tablet Take 20 mg by mouth daily.      Marland Kitchen labetalol (NORMODYNE) 100 MG tablet Take 100 mg by mouth 2 (two) times daily.      Marland Kitchen lisinopril (PRINIVIL,ZESTRIL) 20 MG tablet Take 20 mg by mouth daily.      . traZODone (DESYREL) 100 MG tablet Take 100 mg by mouth at bedtime.        Home: Home Living Lives With: Alone Available Help at Discharge: Family;Friend(s) Type of Home: Apartment Home Access: Level entry Home Layout: One level Home Adaptive Equipment: None Additional Comments: states friend might be able to stay with him, not sure if 24 hour, sister checks in frequently when he is at home  Functional History: Prior Function  Driving: No Functional Status:  Mobility:   Transfers Transfers: Sit to Stand;Stand to Sit Sit to Stand: 4: Min assist;With armrests;From chair/3-in-1;With upper extremity assist Stand to Sit: With armrests;With upper extremity assist;To  chair/3-in-1;4: Min assist Ambulation/Gait Ambulation/Gait Assistance: Not tested (comment) (patient reports doesn't feel ready due to hip pain)    ADL:    Cognition: Cognition Arousal/Alertness: Awake/alert Orientation Level: Oriented X4 Cognition Overall Cognitive Status: Appears within functional limits for tasks assessed/performed Arousal/Alertness: Awake/alert Behavior During Session: [facility] for tasks performed  Blood pressure 142/82, pulse 74, temperature 98.8 F (37.1 C), temperature source Oral, resp. rate 20, height 6' (1.829 m), weight 120.4 kg (265 lb 6.9 oz), SpO2 99.00%. Physical Exam  Vitals reviewed. HENT:  Head: Normocephalic.  Eyes:       Pupils are round and reactive to light  Neck: Neck supple. No thyromegaly present.  Cardiovascular: Normal rate and regular rhythm.   Pulmonary/Chest: Effort normal and breath sounds normal. No respiratory distress.  Abdominal: Soft. Bowel sounds are normal. He exhibits no distension. There is no tenderness. There is no rebound.  Neurological: He is alert.       Patient with flat affect but appropriate during exam. He was able remain person, place as well as age. He followed simple commands  Skin: Skin is warm and dry.  Motor strength is 5/5 in bilateral deltoid, biceps, triceps, grip, hip flexors, knee extensors, ankle dorsiflexor and plantar flexor Sensory is intact to light touch in both upper and lower limbs Finger nose to finger test is normal bilateral upper extremities Heel-to-shin test is normal bilateral lower extremity is Standing is with a widened base of support area no lateral leaning or posterior leaning  Results for orders placed during the hospital encounter of 02/19/12 (from the past 24 hour(s))  GLUCOSE, CAPILLARY     Status: Normal   Collection Time   02/23/12  1:00 PM      Component Value Range   Glucose-Capillary 94  70 - 99 mg/dL   Comment 1 Notify RN     Comment 2 Documented in Chart    GLUCOSE,  CAPILLARY     Status: Abnormal   Collection Time   02/23/12  4:43 PM      Component Value Range   Glucose-Capillary 107 (*) 70 - 99 mg/dL   Comment 1 Notify RN     Comment 2 Documented in Chart    TROPONIN I     Status: Abnormal   Collection Time   02/23/12  6:49 PM      Component Value Range   Troponin I 0.64 (*) <0.30 ng/mL  CK TOTAL AND CKMB     Status: Abnormal   Collection Time   02/23/12  6:49 PM      Component [place]   [place] [address] (*) 7 - 232 U/L   CK, MB 23.1 (*) 0.3 - 4.0 ng/mL   Relative Index 0.1  0.0 - 2.5  GLUCOSE, CAPILLARY     Status: Abnormal   Collection Time   02/23/12 10:33 PM      Component Value Range   Glucose-Capillary 122 (*) 70 - 99 mg/dL   Comment 1 Documented in Chart     Comment 2 Notify RN    HEPATIC FUNCTION PANEL     Status: Abnormal   Collection Time   02/24/12  4:41 AM      Component Value Range   Total Protein 6.0  6.0 - 8.3 g/dL  Albumin 2.5 (*) 3.5 - 5.2 g/dL   AST 475 (*) 0 - 37 U/L   ALT 599 (*) 0 - 53 U/L   Alkaline Phosphatase 128 (*) 39 - 117 U/L   Total Bilirubin 0.7  0.3 - 1.2 mg/dL   Bilirubin, Direct 0.4 (*) 0.0 - 0.3 mg/dL   Indirect Bilirubin 0.3  0.3 - 0.9 mg/dL  TROPONIN I     Status: Abnormal   Collection Time   02/24/12  4:45 AM      Component Value Range   Troponin I 0.50 (*) <0.30 ng/mL  CK TOTAL AND CKMB     Status: Abnormal (Preliminary result)   Collection Time   02/24/12  4:45 AM      Component Value Range   Total CK PENDING  7 - 232 U/L   CK, MB 18.4 (*) 0.3 - 4.0 ng/mL   Relative Index PENDING  0.0 - 2.5  RENAL FUNCTION PANEL     Status: Abnormal   Collection Time   02/24/12  4:45 AM      Component Value Range   Sodium 136  135 - 145 mEq/L   Potassium 4.9  3.5 - 5.1 mEq/L   Chloride 95 (*) 96 - 112 mEq/L   CO2 21  19 - 32 mEq/L   Glucose, Bld 100 (*) 70 - 99 mg/dL   BUN 76 (*) 6 - 23 mg/dL   Creatinine, Ser 9.54 (*) 0.50 - 1.35 mg/dL   Calcium 6.6 (*) 8.4 - 10.5 mg/dL   Phosphorus 8.9 (*) 2.3 -  4.6 mg/dL   Albumin 2.5 (*) 3.5 - 5.2 g/dL   GFR calc non Af Amer 5 (*) >90 mL/min   GFR calc Af Amer 6 (*) >90 mL/min  GLUCOSE, CAPILLARY     Status: Abnormal   Collection Time   02/24/12  8:16 AM      Component Value Range   Glucose-Capillary 104 (*) 70 - 99 mg/dL   Comment 1 Documented in Chart     Comment 2 Notify RN     No results found.  Assessment/Plan: Diagnosis: Bilateral cerebellar infarcts with truncal ataxia 1. Does the need for close, 24 hr/day medical supervision in concert with the patient's rehab needs make it unreasonable for this patient to be served in a less intensive setting? Yes 2. Co-Morbidities requiring supervision/potential complications: Acute renal failure, hypertension, history of hepatitis C, history of schizophrenia 3. Due to bladder management, bowel management, safety, skin/wound care, disease management, medication administration and patient education, does the patient require 24 hr/day rehab nursing? Yes 4. Does the patient require coordinated care of a physician, rehab nurse, PT (1-2 hrs/day, 5 days/week) and OT (1-2 hrs/day, 5 days/week) to address physical and functional deficits in the context of the above medical diagnosis(es)? Yes Addressing deficits in the following areas: balance, endurance, locomotion, transferring, bathing, dressing and toileting 5. Can the patient actively participate in an intensive therapy program of at least 3 hrs of therapy per day at least 5 days per week? Yes 6. The potential for patient to make measurable gains while on inpatient rehab is good 7. Anticipated functional outcomes upon discharge from inpatient rehab are Supervision mobility with PT, Supervision ADLs with OT, Not applicable with SLP. 8. Estimated rehab length of stay to reach the above functional goals is: 2 weeks 9. Does the patient have adequate social supports to accommodate these discharge functional goals? Yes 10. Anticipated D/C setting:  Home 11. Anticipated post D/C treatments:  HH therapy 12. Overall Rehab/Functional Prognosis: good  RECOMMENDATIONS: This patient's condition is appropriate for continued rehabilitative care in the following setting: CIR Patient has agreed to participate in recommended program. Yes Note that insurance prior authorization may be required for reimbursement for recommended care.  Comment:    02/24/2012

## 2012-02-24 NOTE — Progress Notes (Signed)
Stroke Team Progress Note  HISTORY [name] [name] is a 57 y.o. male with a history of schizpphrenia who was brought to the ER 02/19/2012 due to AMS. MRI revealed cerebellar infarcts. He states that he fell yesterday 02/18/2012, but was not sure why, then noticed that he could not move his left side. This improved and he currently is only left with numbness of the right hand and left lower leg. He states that yesterday, he did not feel confused, but rather was confused about what caused him to fall. Patient was not a TPA candidate secondary to delay in arrival. He was admitted for further evaluation and treatment.  SUBJECTIVE Patient up in chair at bedside. Friend at bedside.  OBJECTIVE Most recent Vital Signs: Filed Vitals:   02/24/12 0440 02/24/12 0643 02/24/12 0725 02/24/12 0830  BP: 148/93 142/82 146/82   Pulse: 74  71 69  Temp: 98.1 F (36.7 C)  98.8 F (37.1 C)   TempSrc: Oral  Oral   Resp: 20     Height:      Weight:      SpO2: 99%  96% 91%   CBG (last 3)   Basename 02/24/12 0816 02/23/12 2233 02/23/12 1643  GLUCAP 104* 122* 107*   IV Fluid Intake:     .  sodium bicarbonate  infusion 1000 mL 175 mL/hr at 02/23/12 2248   MEDICATIONS    . aspirin  325 mg Oral Daily  . calcium carbonate  400 mg of elemental calcium Oral TID WC  . carvedilol  3.125 mg Oral BID WC  . folic acid  1 mg Oral Daily  . furosemide  80 mg Intravenous QID  . hydrALAZINE  25 mg Oral Q8H  . multivitamin with minerals  1 tablet Oral Daily  . sodium chloride      . thiamine  100 mg Oral Daily  . traZODone  100 mg Oral QHS   PRN:  LORazepam, LORazepam, ondansetron (ZOFRAN) IV, oxyCODONE  Diet:  General thin liquids Activity:   Bathroom privileges with assistance DVT Prophylaxis:  SCDs   CLINICALLY SIGNIFICANT STUDIES Basic Metabolic Panel:   Lab [id number] 02/23/12 0845 02/23/12 0309  NA 136 134* --  K 4.9 5.1 --  CL 95* 94* --  CO2 21 21 --  GLUCOSE 100* 109* --  BUN 76* 67* --    CREATININE 9.54* 8.85* --  CALCIUM 6.6* 6.6* --  MG -- -- 1.8  PHOS 8.9* 8.2* --   Liver Function Tests:   Lab 02/24/12 0445 02/24/12 0441 02/23/12 0839  AST -- 475* 1016*  ALT -- 599* 927*  ALKPHOS -- 128* 132*  BILITOT -- 0.7 1.1  PROT -- 6.0 6.4  ALBUMIN 2.5* 2.5* --   CBC:   Lab 02/23/12 0304 02/22/12 1425 02/22/12 0455 02/19/12 2308  WBC 7.7 -- 8.5 --  NEUTROABS -- -- -- 9.6*  HGB 11.8* -- 12.5* --  HCT 35.2* -- 37.4* --  MCV 89.6 -- 89.5 --  PLT 78* 77* -- --   Coagulation:   Lab 02/22/12 1425 02/22/12 0455 02/19/12 2308  LABPROT 14.6 14.6 15.0  INR 1.16 1.16 1.20   Cardiac Enzymes:   Lab 02/24/12 0445 02/23/12 1849 02/23/12 0845  CKTOTAL 96295* 35345* 44463*  CKMB 18.4* 23.1* 31.2*  CKMBINDEX -- -- --  TROPONINI 0.50* 0.64* 0.62*   Urinalysis:   Lab 02/20/12 0104  COLORURINE AMBER*  LABSPEC 1.021  PHURINE 5.0  GLUCOSEU NEGATIVE  HGBUR LARGE*  BILIRUBINUR NEGATIVE  KETONESUR NEGATIVE  PROTEINUR >300*  UROBILINOGEN 0.2  NITRITE NEGATIVE  LEUKOCYTESUR TRACE*   Lipid Panel    Component Value Date/Time   CHOL 245* 02/20/2012 0640   TRIG 345* 02/20/2012 0640   HDL 55 02/20/2012 0640   CHOLHDL 4.5 02/20/2012 0640   VLDL 69* 02/20/2012 0640   LDLCALC 121* 02/20/2012 0640   HgbA1C  Lab Results  Component Value Date   HGBA1C 5.8* 02/20/2012    Urine Drug Screen:     Component Value Date/Time   LABOPIA NONE DETECTED 02/20/2012 0105   COCAINSCRNUR NONE DETECTED 02/20/2012 0105   LABBENZ NONE DETECTED 02/20/2012 0105   AMPHETMU NONE DETECTED 02/20/2012 0105   THCU NONE DETECTED 02/20/2012 0105   LABBARB NONE DETECTED 02/20/2012 0105    Alcohol Level:   Lab 02/19/12 2321  ETH 105*   CT of the brain    MRI of the brain  02/20/2012  1.  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  No mass effect or hemorrhage.  Possible associated acute punctate left brain stem infarct. 2.  Questionable acute left external capsule and caudate nucleus  punctate lacunar infarct. This could reflect synchronous small vessel ischemia, less likely sequelae of embolic phenomena given the different vascular territories.   MRA of the brain  See TCD  2D Echocardiogram  EF 30-35% with no source of embolus. Possible hypokinesis of the midinferolateral myocardium.  TEE PFO with right to left shunt, EF 30%, pulmonary hypertension  Carotid Doppler  No evidence of hemodynamically significant internal carotid artery stenosis. Vertebral artery flow is antegrade.   LE Venous Doppler No evidence of deep vein thrombosis involving the right lower extremity. No evidence of deep vein thrombosis involving the left lower extremity. No evidence of Baker's cyst on the right or left.  TCD   CXR  Cardiomegaly. Prominence of the mediastinum may reflect portable technique and magnification although underlying mass or tortuous aorta not excluded. Central pulmonary vascular prominence without frank pulmonary  edema. Left base subsegmental atelectasis  EKG  normal sinus rhythm.   US Abdomen 02/21/2012   Dense slightly heterogeneous liver with morphologic suggestive of cirrhosis.  Mild CBD dilatation - 11 mm.  Consider further evaluation.  Therapy Recommendations pending  Physical Exam   General: in bed, NAD  CV: RRR  Mental Status:  Patient is awake, alert, oriented to person, place, month, year, and situation. No sign of aphasia  Cranial Nerves:  II: Visual Fields are full. Pupils are equal, round, and reactive to light. Discs are difficult to visualize  III,IV, VI: EOMI without ptosis or diploplia. or nystagmus V: Facial sensation is symmetric to temperature  VII: Facial movement is symmetric.  VIII: hearing is intact to voice  X: Uvula elevates symmetrically  XI: Shoulder shrug is symmetric.  XII: tongue is midline without atrophy or fasciculations.  Motor:  Tone is normal. Bulk is normal. 5/5 strength was present in all four extremities.  Sensory:   Sensation is reduced in the right hand and left leg below the knee.  Deep Tendon Reflexes:  1+ and symmetric in the biceps, trace at patellae  Cerebellar:  FNF impaired on right consistent with dysmetria.  Gait: Not tested due to leg pain  ASSESSMENT [name] is a 57 y.o. male presenting with a fall of unknown etiology . Imaging confirms a right greater than left cerebellar infarcts. Infarcts felt to be  embolic secondary to unknown etiology. TEE shows PFO but this is unrelated to current  stroke. On no antiplatelet prior to admission. Now on aspirin 325 mg orally every day for secondary stroke prevention. Patient with resultant left hemisensory deficit and ataxia.  PFO (patent foramen ovale) an incidental finding.  Pulmonary Hypertension per TEE CHF w/ EF 30-35% Thmbocytopenia, Plt 160 down to 78. lovenox stopped Hypertension Schizophrenia Acute renal failure, nephrology following HIT Hepatitis C Hx cocaine and etoh abuse, on CIWA  Hyperlipidemia, LDL 121, not on statin PTA, goal LDL < 100, statin therapy contraindicated w transaminitis and rhabdo  Diabetes, HgbA1c 5.8 Shock liver Elevated troponins  Hospital day # 5  TREATMENT/PLAN  Continue aspirin 325 mg orally every day for secondary stroke prevention.  Dr. Leonie Man will F/u TCD results An outpatient TCD bubble study with emboli monitoring with Dr. Leonie Man in 1 month to further evaluate for possible PFO. Have patient call for appointment. (I have added to discharge instruction sheet).  Therapy evals when medically able Stroke Service will sign off. Please call should any needs arise.  Burnetta Sabin, MSN, RN, ANVP-BC, ANP-BC, [facility] Pager: [phone] 02/24/2012 9:42 AM  I have personally obtained a history, examined the patient, evaluated imaging results, and formulated the assessment and plan of care. I agree with the above.  Antony Contras, MD Medical Director [facility] Pager: [phone] 02/24/2012 9:42 AM

## 2012-02-24 NOTE — Progress Notes (Signed)
VASCULAR LAB PRELIMINARY  PRELIMINARY  PRELIMINARY  PRELIMINARY  Transcranial Doppler completed.    Preliminary report:  TCD completed  Joshawn Crissman, RVS 02/24/2012, 11:43 AM

## 2012-02-24 NOTE — Progress Notes (Signed)
Internal Medicine Attending  Date: 02/24/2012  Patient name: [name] [name] Medical record number: [id number] Date of birth: 04-22-1955 Age: 57 y.o. Gender: male  I saw and evaluated the patient and discussed his care on a.m. rounds with house staff. I reviewed the resident's note by Dr. Sandy Salaam and I agree with the resident's findings and plans as documented in her note, with the following additional comments.  Plans include decreased rate of IV fluid replacement as per nephrology consult (see note by Dr. Moshe Cipro), and they have requested that Foley catheter be left in to better manage his large volume of urine output.

## 2012-02-24 NOTE — Evaluation (Signed)
Occupational Therapy Evaluation Patient Details Name: [name] [name] MRN: [id number] DOB: July 26, 1955 Today's Date: 02/24/2012 Time: [id number] OT Time Calculation (min): 14 min  OT Assessment / Plan / Recommendation Clinical Impression  57 yo male admitted with MRI + acute cerebellar CVA. PTA HTN, hep C schizophrenia ETOH abuse and admitted 12514 for AMS with slurred speech. Ot to follow acutely. Recommend CIR for d/c planning    OT Assessment  Patient needs continued OT Services    Follow Up Recommendations  CIR    Barriers to Discharge      Equipment Recommendations  3 in 1 bedside comode    Recommendations for Other Services Rehab consult  Frequency  Min 3X/week    Precautions / Restrictions Precautions Precautions: Fall Restrictions Weight Bearing Restrictions: No   Pertinent Vitals/Pain stable    ADL  Lower Body Dressing: +1 Total assistance Where Assessed - Lower Body Dressing: Supported sitting Toilet Transfer: Minimal assistance Toilet Transfer Method: Sit to stand Toilet Transfer Equipment: Raised toilet seat with arms (or 3-in-1 over toilet) Equipment Used: Gait belt (stedy) Transfers/Ambulation Related to ADLs: Pt completed basic transfer pushing up with BIL UE from chair. pt requires elevated surface for transfers at this time. pt with delayed response using Lt UE but pt attempting without cues ADL Comments: Pt sitting in chair on arrival. Pt educated on using stedy as a RW. Pt progressed to static standing a window looking at snow. Pt demonstrates ability to ambulate to 3n1 and sink level task    OT Diagnosis: Generalized weakness  OT Problem List: Decreased strength;Decreased activity tolerance;Impaired balance (sitting and/or standing);Decreased coordination;Decreased safety awareness;Decreased knowledge of use of DME or AE;Decreased knowledge of precautions OT Treatment Interventions: Self-care/ADL training;Neuromuscular education;DME and/or AE  instruction;Therapeutic activities;Patient/family education;Balance training   OT Goals Acute Rehab OT Goals OT Goal Formulation: With patient Time For Goal Achievement: 03/09/12 Potential to Achieve Goals: Good ADL Goals Pt Will Perform Grooming: with min assist;Unsupported;Standing at sink ADL Goal: Grooming - Progress: Goal set today Pt Will Perform Upper Body Bathing: with mod assist;Standing at sink;Supported ADL Goal: Upper Body Bathing - Progress: Goal set today Pt Will Perform Upper Body Dressing: with set-up;Sit to stand from chair ADL Goal: Upper Body Dressing - Progress: Goal set today Pt Will Transfer to Toilet: with supervision;Ambulation;3-in-1 ADL Goal: Toilet Transfer - Progress: Goal set today Pt Will Perform Toileting - Clothing Manipulation: with min assist;Sitting on 3-in-1 or toilet ADL Goal: Toileting - Clothing Manipulation - Progress: Goal set today Miscellaneous OT Goals Miscellaneous OT Goal #1: Pt will complete 3 step adl task with setup standing with no cues. OT Goal: Miscellaneous Goal #1 - Progress: Goal set today  Visit Information  Last OT Received On: 02/24/12 Assistance Needed: +2 (safety & lines) PT/OT Co-Evaluation/Treatment: Yes    Subjective Data  Subjective: "I havent gotten a change to see the snow yet. I hear its purty" Patient Stated Goal: to stand and look at the snow   Prior [place] Lives With: Alone Available Help at Discharge: Family;Friend(s) Type of Home: Apartment Home Access: Level entry Home Layout: One level Home Adaptive Equipment: None Additional Comments: states friend might be able to stay with him, not sure if 24 hour, sister checks in frequently when he is at home Prior Function Level of Independence: Independent Driving: No Communication Communication: No difficulties Dominant Hand: Right         Vision/Perception     Cognition  Overall Cognitive Status: Appears  within functional  limits for tasks assessed/performed Arousal/Alertness: Awake/alert Behavior During Session: [facility] for tasks performed    Extremity/Trunk Assessment Right Upper Extremity Assessment RUE ROM/Strength/Tone: Within functional levels RUE Sensation: WFL - Light Touch RUE Coordination: WFL - gross/fine motor Left Upper Extremity Assessment LUE ROM/Strength/Tone: Deficits LUE ROM/Strength/Tone Deficits: pt reports decr independence with Lt UE states "its just not right yet", Shoulder flexion 4 out 5, grasp brunstrom V  4 out 5 gross grasp LUE Coordination: WFL - gross motor     Mobility Bed Mobility Bed Mobility: Sit to Supine Sit to Supine: 4: Min assist Details for Bed Mobility Assistance: (A) to elevate LE into bed with cues for proper technique Transfers Sit to Stand: 4: Min assist;From chair/3-in-1 Stand to Sit: 4: Min assist;To bed Details for Transfer Assistance: (A) to initiate transfer and slowly descend to bed with max cues for hand placement and proper placement     Shoulder Instructions     Exercise     Balance Balance Balance Assessed: Yes Static Standing Balance Static Standing - Balance Support: Bilateral upper extremity supported Static Standing - Level of Assistance: 5: Stand by assistance Static Standing - Comment/# of Minutes: Minguard for safety with cues for upright posture  5 minutes   End of Session OT - End of Session Activity Tolerance: Patient tolerated treatment well Patient left: in bed;with call bell/phone within reach (supine for procedure) Nurse Communication: Mobility status;Precautions  GO     Veneda Melter 02/24/2012, 12:15 PM Pager: [phone]

## 2012-02-25 DIAGNOSIS — E872 Acidosis: Secondary | ICD-10-CM

## 2012-02-25 LAB — HEPATIC FUNCTION PANEL
Alkaline Phosphatase: 115 U/L (ref 39–117)
Indirect Bilirubin: 0.4 mg/dL (ref 0.3–0.9)
Total Bilirubin: 0.6 mg/dL (ref 0.3–1.2)
Total Protein: 6 g/dL (ref 6.0–8.3)

## 2012-02-25 LAB — GLUCOSE, CAPILLARY
Glucose-Capillary: 123 mg/dL — ABNORMAL HIGH (ref 70–99)
Glucose-Capillary: 119 mg/dL — ABNORMAL HIGH (ref 70–99)
Glucose-Capillary: 112 mg/dL — ABNORMAL HIGH (ref 70–99)

## 2012-02-25 LAB — CBC WITH DIFFERENTIAL/PLATELET
Basophils Absolute: 0 10*3/uL (ref 0.0–0.1)
Lymphocytes Relative: 14 % (ref 12–46)
Neutro Abs: 3.6 10*3/uL (ref 1.7–7.7)
Platelets: 151 10*3/uL (ref 150–400)
RDW: 14.5 % (ref 11.5–15.5)
WBC: 5.6 10*3/uL (ref 4.0–10.5)

## 2012-02-25 LAB — RENAL FUNCTION PANEL
CO2: 27 mEq/L (ref 19–32)
GFR calc Af Amer: 6 mL/min — ABNORMAL LOW (ref >90)
GFR calc non Af Amer: 6 mL/min — ABNORMAL LOW (ref >90)
Glucose, Bld: 99 mg/dL (ref 70–99)
Phosphorus: 9.2 mg/dL — ABNORMAL HIGH (ref 2.3–4.6)
Potassium: 4.2 mEq/L (ref 3.5–5.1)
Sodium: 137 mEq/L (ref 135–145)

## 2012-02-25 LAB — CK TOTAL AND CKMB (NOT AT ARMC): Total CK: 19095 U/L — ABNORMAL HIGH (ref 7–232)

## 2012-02-25 MED ORDER — FUROSEMIDE 10 MG/ML IJ SOLN
80.0000 mg | Freq: Two times a day (BID) | INTRAMUSCULAR | Status: DC
  Administered 2012-02-25: 80 mg via INTRAVENOUS

## 2012-02-25 MED ORDER — SODIUM CHLORIDE 0.9 % IV BOLUS (SEPSIS): 500.0000 mL | INTRAVENOUS | Status: DC

## 2012-02-25 MED ORDER — SODIUM CHLORIDE 0.9 % IV SOLN
INTRAVENOUS | Status: DC
  Administered 2012-02-25 – 2012-02-29 (×11): via INTRAVENOUS

## 2012-02-25 MED ORDER — FUROSEMIDE 10 MG/ML IJ SOLN
80.0000 mg | Freq: Two times a day (BID) | INTRAMUSCULAR | Status: DC
  Administered 2012-02-25 – 2012-02-26 (×2): 80 mg via INTRAVENOUS
  Filled 2012-02-25 (×4): qty 8

## 2012-02-25 MED ORDER — SODIUM CHLORIDE 0.9 % IV BOLUS (SEPSIS)
500.0000 mL | INTRAVENOUS | Status: DC
  Administered 2012-02-25: 500 mL via INTRAVENOUS

## 2012-02-25 MED ORDER — OXYCODONE HCL 5 MG PO TABS
10.0000 mg | ORAL_TABLET | ORAL | Status: DC | PRN
  Administered 2012-02-25: 5 mg via ORAL
  Administered 2012-02-25 – 2012-02-29 (×13): 10 mg via ORAL
  Filled 2012-02-25 (×4): qty 2
  Filled 2012-02-25: qty 1
  Filled 2012-02-25 (×3): qty 2
  Filled 2012-02-25 (×2): qty 1
  Filled 2012-02-25 (×5): qty 2

## 2012-02-25 NOTE — Progress Notes (Signed)
Physical Therapy Treatment Patient Details Name: [name] [name] MRN: [id number] DOB: 1956-01-16 Today's Date: 02/25/2012 Time: [id number] PT Time Calculation (min): 17 min  PT Assessment / Plan / Recommendation Comments on Treatment Session  Pt limited ambulation distance due to endurance and left hip pain.  Pt very motivated however ready to eat breakfast when return to room.  Continue to recommend inpatient rehab and improve overall independence.    Follow Up Recommendations  CIR     Equipment Recommendations  Rolling walker with 5" wheels    Recommendations for Other Services Rehab consult  Frequency Min 4X/week   Plan Discharge plan remains appropriate;Frequency remains appropriate    Precautions / Restrictions Precautions Precautions: Fall Restrictions Weight Bearing Restrictions: No   Pertinent Vitals/Pain C/o left hip pain but does not rate    Mobility  Bed Mobility Bed Mobility: Sit to Supine Supine to Sit: 4: Min assist Details for Bed Mobility Assistance: (A) to elevate trunk OOB with cues for technique Transfers Transfers: Sit to Stand;Stand to Sit Sit to Stand: 4: Min assist;From bed Stand to Sit: 4: Min assist;To bed Details for Transfer Assistance: (A) to initiate transfer and slowly descend to bed with max cues for hand placement and proper placement Ambulation/Gait Ambulation/Gait Assistance: 4: Min assist Ambulation Distance (Feet): 40 Feet Assistive device: Rolling walker Ambulation/Gait Assistance Details: (A) to manage RW and cues for upright posture.  Limited distance due to impaired endurance.  Pt with step to gait with left LE hip pain. Gait Pattern: Step-to pattern;Decreased stride length;Shuffle;Trunk flexed Gait velocity: decreased Stairs: No Modified Rankin (Stroke Patients Only) Pre-Morbid Rankin Score: No symptoms Modified Rankin: Moderate disability    Exercises     PT Diagnosis:    PT Problem List:   PT Treatment Interventions:      PT Goals Acute Rehab PT Goals PT Goal Formulation: With patient Time For Goal Achievement: 03/02/12 Potential to Achieve Goals: Good Pt will go Supine/Side to Sit: with supervision PT Goal: Supine/Side to Sit - Progress: Progressing toward goal Pt will go Sit to Supine/Side: with min assist PT Goal: Sit to Supine/Side - Progress: Progressing toward goal Pt will go Sit to Stand: with supervision PT Goal: Sit to Stand - Progress: Progressing toward goal Pt will go Stand to Sit: with supervision PT Goal: Stand to Sit - Progress: Progressing toward goal Pt will Stand: with supervision;3 - 5 min;with unilateral upper extremity support PT Goal: Stand - Progress: Progressing toward goal Pt will Ambulate: >150 feet;with least restrictive assistive device;with supervision PT Goal: Ambulate - Progress: Progressing toward goal  Visit Information  Last PT Received On: 02/25/12 Assistance Needed: +2 (lines)    Subjective Data  Subjective: "I guess I can get up for breakfast. Patient Stated Goal: To get stronger   Cognition  Overall Cognitive Status: Appears within functional limits for tasks assessed/performed Arousal/Alertness: Awake/alert Behavior During Session: [facility] for tasks performed    Balance     End of Session PT - End of Session Equipment Utilized During Treatment: Gait belt Activity Tolerance: Patient tolerated treatment well Patient left: in chair;with call bell/phone within reach Nurse Communication: Mobility status   GP     Melissa Tomaselli 02/25/2012, 11:05 AM Antoine Poche, [place] DPT [phone]

## 2012-02-25 NOTE — Progress Notes (Signed)
Internal Medicine Attending  Date: 02/25/2012  Patient name: [name] [name] Medical record number: [id number] Date of birth: [birth year] Age: 57 y.o. Gender: male  I saw and evaluated the patient. I reviewed the resident's note by Dr. Sandy Salaam and I agree with the resident's findings and plans as documented in her note.

## 2012-02-25 NOTE — Progress Notes (Signed)
I discussed with Dr. Sandy Salaam that noted creat 9.29 with no acute needs for hemodialysis, but we would like to follow his renal issues further on acute hospital before seeking admit to inpt rehab. I will follow his progress daily. [id number]

## 2012-02-25 NOTE — Progress Notes (Signed)
Report called to RN patient transferring to 4N10 via wheelchair with personal items

## 2012-02-25 NOTE — Progress Notes (Signed)
Subjective: Feeling OK, concerned that PRN pain meds not adequately controlling L leg pain. No nausea, abdominal pain, or confusion. Eating breakfast.   UOP of 8 L yesterday. Denies CP, SOB, N/V, dizziness, diarrhea.  Objective: Vital signs in last 24 hours: Filed Vitals:   02/24/12 2320 02/25/12 0340 02/25/12 0754 02/25/12 0801  BP: 123/74 166/90  150/98  Pulse: 80 80  73  Temp: 98.1 F (36.7 C) 97.6 F (36.4 C) 98.7 F (37.1 C)   TempSrc: Oral Oral    Resp: 20 17    Height:      Weight:  263 lb 7.2 oz (119.5 kg)    SpO2: 99% 98%     Weight change: -1 lb 15.7 oz (-0.9 kg)  Intake/Output Summary (Last 24 hours) at 02/25/12 1148 Last data filed at 02/25/12 1000  Gross per 24 hour  Intake 5331.92 ml  Output   8000 ml  Net -2668.08 ml  General: sitting up and eating breakfast, NAD  HEENT: R pupil irregular shape (prior cataract surgery), MMM of OP, EOMI  Cardiac: RRR, no rubs, murmurs or gallops  Pulm: fine bibasilar crackles Abd: not TTP, normoactive BS Ext: warm and well perfused, no trace foot edema Neuro:  Alert and oriented X3  CN2-12 intact  Sensation intact face, b/l upper and lower extremities  Strength 5/5 face bilateral upper and lower extremities    Lab Results: Basic Metabolic Panel:  Lab [id number] 0500 02/24/12 0445 02/23/12 0309  NA 137 136 --  K 4.2 4.9 --  CL 93* 95* --  CO2 27 21 --  GLUCOSE 99 100* --  BUN 80* 76* --  CREATININE 9.29* 9.54* --  CALCIUM 7.0* 6.6* --  MG -- -- 1.8  PHOS 9.2* 8.9* --   Liver Function Tests:  Lab 02/25/12 0500 02/24/12 0445 02/24/12 0441  AST 247* -- 475*  ALT 409* -- 599*  ALKPHOS 115 -- 128*  BILITOT 0.6 -- 0.7  PROT 6.0 -- 6.0  ALBUMIN 2.5*2.4* 2.5* --   CBC:  Lab 02/25/12 0500 02/23/12 0304 02/19/12 2308  WBC 5.6 7.7 --  NEUTROABS 3.6 -- 9.6*  HGB 11.3* 11.8* --  HCT 34.1* 35.2* --  MCV 90.7 89.6 --  PLT 151 78* --   Cardiac Enzymes:  Lab 02/25/12 0500 02/24/12 0445 02/23/12 1849  CKTOTAL  91478* 32139* 35345*  CKMB 11.2* 18.4* 23.1*  CKMBINDEX -- -- --  TROPONINI <0.30 0.50* 0.64*   D-Dimer:  Lab 02/22/12 1425  DDIMER 7.68*   CBG:  Lab 02/25/12 0759 02/24/12 2151 02/24/12 1715 02/24/12 1240 02/24/12 0816 02/23/12 2233  GLUCAP 123* 112* 104* 108* 104* 122*   Hemoglobin A1C:  Lab 02/20/12 0640  HGBA1C 5.8*   Fasting Lipid Panel:  Lab 02/20/12 0640  CHOL 245*  HDL 55  LDLCALC 121*  TRIG 345*  CHOLHDL 4.5  LDLDIRECT --   Thyroid Function Tests:  Lab 02/22/12 1141  TSH 3.160  T4TOTAL --  FREET4 --  T3FREE --  THYROIDAB --   Coagulation:  Lab 02/22/12 1425 02/22/12 0455 02/19/12 2308  LABPROT 14.6 14.6 15.0  INR 1.16 1.16 1.20   Urine Drug Screen: Drugs of Abuse     Component Value Date/Time   LABOPIA NONE DETECTED 02/20/2012 0105   COCAINSCRNUR NONE DETECTED 02/20/2012 0105   LABBENZ NONE DETECTED 02/20/2012 0105   AMPHETMU NONE DETECTED 02/20/2012 0105   THCU NONE DETECTED 02/20/2012 0105   LABBARB NONE DETECTED 02/20/2012 0105    Alcohol Level:  Lab 02/19/12 2321  ETH 105*   Urinalysis:  Lab 02/20/12 0104  COLORURINE AMBER*  LABSPEC 1.021  PHURINE 5.0  GLUCOSEU NEGATIVE  HGBUR LARGE*  BILIRUBINUR NEGATIVE  KETONESUR NEGATIVE  PROTEINUR >300*  UROBILINOGEN 0.2  NITRITE NEGATIVE  LEUKOCYTESUR TRACE*    Micro Results: Recent Results (from the past 240 hour(s))  URINE CULTURE     Status: Normal   Collection Time   02/21/12 10:15 AM      Component Value Range Status Comment   Specimen Description URINE, CATHETERIZED   Final    Special Requests NONE   Final    Culture  Setup Time 02/21/2012 21:40   Final    Colony Count NO GROWTH   Final    Culture NO GROWTH   Final    Report Status 02/22/2012 FINAL   Final   MRSA PCR SCREENING     Status: Normal   Collection Time   02/21/12 12:58 PM      Component Value Range Status Comment   MRSA by PCR NEGATIVE  NEGATIVE Final    Studies/Results: No results found. Medications: I have  reviewed the patient's current medications. Scheduled Meds:    . aspirin  325 mg Oral Daily  . calcium carbonate  400 mg of elemental calcium Oral TID WC  . carvedilol  3.125 mg Oral BID WC  . folic acid  1 mg Oral Daily  . furosemide  80 mg Intravenous BID  . isosorbide-hydrALAZINE  1 tablet Oral TID  . multivitamin with minerals  1 tablet Oral Daily  . [COMPLETED] sodium chloride  500 mL Intravenous Q4H  . thiamine  100 mg Oral Daily  . traZODone  100 mg Oral QHS   Continuous Infusions:    . sodium chloride     PRN Meds:.ondansetron (ZOFRAN) IV, oxyCODONE  Assessment/Plan:  Mr. Merker is 57 y.o. male with a pmh of schizophrenia, HTN, Hep C, h/o polysubstance abuse (cocaine/EtOH) admitted with confusion and found to have multiple cerebellar infarcts, rhabdomyolysis, and acute renal failure and transaminitis.   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 LE dopplers negative for DVT - Continue ASA 325mg  for secondary stroke prophylaxis  - A1C 5.8 --> nondiabetic  - LDL 121--> statin therapy contraindicated w transaminitis and rhabdo  -PT/OT: recommend CIR, pt evaluated and accepted by rehab MD for transfer when medically stable. Expect Monday.   2) Acute renal failure  Patient presents with ARF. Baseline Cr is around 1.2-1.3. Cr on admission 2.4 and peaked at 9.5 yesterday, today has levelled off at 9.2 today.  UA on admission notable for granular and RBC casts. Suspect pigment-induced renal failure in setting of rhabdo w CK >50,000. May also be ATN from hypotension, ACEi/NSAIDS vs AIN vs glomerulonephritis. UOP continues to improve, 8L out yesterday on lasix QID, bicarb drip, and fluid boluses. No urgent indication for HD now. K is normal at 4.2, no evidence of volume overload/uremic encephalopathy.   - Renal following, appreciate input. Bicarb drip  discontinued today. Lasix decreased to 80 BID. Aim for 4L in (NS175 cc/hr)   - Negative ANA, ANCA, GBM, C3, C4, SPEP - Pending labs: cryoglobulin  - follow urine output   Lab Results  Component Value Date   CREATININE 9.29* 02/25/2012    3) Transaminitis  Patient with transaminitis, preserved synthetic function of liver. Acute hepatitis and HIV negative. Chronic  Hep C. Negative tylenol level. Negative ANA. Abdominal ultrasound demonstrates dense hepatic architecture suspicious for cirrhosis, no evidence of infarction. CBD also enlarged at 11 without clear etiology, no gall stones.  Suspect multifactorial : rhabdo, chronic + binge EtOH use, and shock liver in setting of hypotension. Transaminases are downtrending. GI following.  - Continue to follow transaminases. Much improvement. - Will need nonurgent MRI abd+MRCP, per GI   Lab Results  Component Value Date   ALT 409* 02/25/2012   AST 247* 02/25/2012   ALKPHOS 115 02/25/2012   BILITOT 0.6 02/25/2012    4) Rhabdomyolysis  On initial testing, patient's CK above upper limits normal >50,000. Etiology not clear. No prolonged immobilization. UDS negative for cocaine, although has h/o use. Not on statin therapy, has been on fenofibrate for hyperTG, but rhabdo would be rare SE. Is chronic drinker with frequent binging and EtOH level 105 on admission. Denies any symptoms of antecedent viral illness prior to admission on our questioning, although Dr. Barkley Bruns note documents endorsement of 1 week N/V/D prior to admission.  No hyperkalemia today (K 4.9). CK continues to downtrend from too high to measure --> 19,000 today. - Discontinue Bicarb drip today - Decrease Lasix to 80mg  BID - NS 175 cc/hr for goal ins of 4L. - Calcium 7.0, corrects to 8.2 with low albumin. Likely related to rhabdo and loop diuresis. Continue tums.  - Follow uric acid, phosphorous Lab Results  Component Value Date   CKTOTAL 13086* 02/25/2012   CKMB 11.2* 02/25/2012    TROPONINI <0.30 02/25/2012    5) Thrombocytopenia  Resolved. Platelets decreased from 168 on admission to 78 2 days ago. Platelets 151 today.     6) Anion Gap Metabolic Acidosis  AG is 17 today, with bicarbonate of 27. Likely in setting of renal failure.  - Continue to monitor daily   7) Congestive heart failure w EF 30-35% Patient noted to have poor EF on echo, no previous dx CHF. Dr. Einar Gip with Cardiology following, recommends Bidil 3 times a day along with low dose beta blocker coreg 3.125 mg PO bid, although patient is presently in well compensated congestive heart failure. Not a candidate for ACEi or ARB due to ARF.   - Coreg, bidil  8) ? UTI  Trace leuks and cloudy urine. Had leukocytosis on admission which has resolved. No organism on culture. Received abx therapy with ceftriaxone for several days, was discontinued 2 days ago.   9) EtOH abuse  On CIWA.   10) Hypertension  Had on episode of hypotension during admission but now hypertensive. Not a candidate for ACEi or ARB due to ARF.   - bidil TID and coreg.   Dispo: CIR accepted, when medically stable (anticipate 02/29/12)  The patient does  have a current PCP ([facility]), therefore will be requiring [facility] follow-up after discharge.   The patient does not have transportation limitations that hinder transportation to clinic appointments.  Services Needed at time of discharge: Y = Yes, Blank = No PT:   OT:   RN:   Equipment:   Other:     LOS: 6 days   Tonia Brooms 02/25/2012, 11:48 AM

## 2012-02-25 NOTE — Progress Notes (Signed)
[facility]  Subjective:  Lower back pain yesterday. Eating ok. No vomiting. Shortness of breath no worst today. No new complaints   Objective: Vital signs in last 24 hours: Blood pressure 150/98, pulse 73, temperature 98.7 F (37.1 C), temperature source Oral, resp. rate 17, height 6' (1.829 m), weight 263 lb 7.2 oz (119.5 kg), SpO2 98.00%.    PHYSICAL EXAM Gen: NAD, laying in bed comfortably Chest: CTA b/l Cardio: regular rate and rhythm, S1, S2 normal, no murmur GI: soft, non-tender; bowel sounds normal; distended  Extremities: trace edema Neurologic: No focal deficit  Lab Results:   Lab 02/25/12 0500 02/24/12 0445 02/23/12 0845  NA 137 136 134*  K 4.2 4.9 5.1  CL 93* 95* 94*  CO2 27 21 21   BUN 80* 76* 67*  CREATININE 9.29* 9.54* 8.85*  ALB -- -- --  GLUCOSE 99 -- --  CALCIUM 7.0* 6.6* 6.6*  PHOS 9.2* 8.9* 8.2*     Basename 02/25/12 0500 02/23/12 0304  WBC 5.6 7.7  HGB 11.3* 11.8*  HCT 34.1* 35.2*  PLT 151 78*     Scheduled Meds:    . aspirin  325 mg Oral Daily  . calcium carbonate  400 mg of elemental calcium Oral TID WC  . carvedilol  3.125 mg Oral BID WC  . folic acid  1 mg Oral Daily  . furosemide  80 mg Intravenous QID  . isosorbide-hydrALAZINE  1 tablet Oral TID  . multivitamin with minerals  1 tablet Oral Daily  . sodium chloride  500 mL Intravenous Q4H  . thiamine  100 mg Oral Daily  . traZODone  100 mg Oral QHS   Continuous Infusions:    .  sodium bicarbonate  infusion 1000 mL 125 mL/hr at 02/25/12 0350   PRN Meds:.ondansetron (ZOFRAN) IV, oxyCODONE  Assessment/Plan: Pt is a 57 y.o. yo male with a PMHx of schizophrenia, HTN, Hep C, h/o polysubstance abuse who was admitted to [facility] on 02/19/2012 for evaluation of altered mental status with confusion and slurred speech, found to have cerebellar stroke on MRI   # Acute renal failure: Creatinine on admission: 2.34 to 9.29 today. Baseline Cr: 1.2 on 02/08/12. Acute renal failure  likely multifactorial from ATN from hypotension, ACEI and NSAID use along with rhabdomyolysis kidney injury. Creatinine actually stable to improved today.    He is nonoliguric. Not uremic. Creatinine trending down. UOP robust with 8.7L out, neg 300cc balance in 24hrs.  - consider decreasing NS bolus to every 12 hours.  Continue lasix IV qid. Will overall decrease ins and outs. - ANA, ANCA negative, GBM negative. C3,C4 negative.  - abdominal US: no hydronephrosis.  - SPEP: no M spike, kappa/lambda ratio normal - continue lasix 80 IV qid. Decrease to 80 BID - no acute need for dialysis.   # Rhabdomyolisis: on lasix and bicarb drip at 125 cc/hr. CK trending down.  - persistent hypocalcemia and hyperphosphatemia on tums.  - continue IV hydration and close monitoring of BMP and CK.  # Anion gap acidosis: likely from renal failure. Bicarb starting to increase on bicarb drip. Consider changing fluids.  # Hypotension: resolved. Now hypertensive. recs for hydralazine and coreg by Dr. Nadyne Coombes with cardiology.  # CVA and altered mental status: followed by neurology. AMS improved.  # Transaminitis: GI consulted. Known hepatitis C. Hep A and B negative. Tylenol<15. Ethanol:105. LFTs improving.  # UTI: urine culture: no growth. Ceftriaxone d/ced # Schizophrenia: per primary team  # Alcohol abuse: on CIWA  with thiamine, folic acid and multivit.  # PFO: not currently anticoagulated given low platelet count  Liam Graham, PGY-2  Family Medicine Resident     LOS: 6 days   LOSQ, STEPHANIE 02/25/2012,8:25 AM  Patient seen and examined, agree with above note with above modifications. Patient stable. Creatinine has trended down from yesterday, hopefully sign of plateau.  Will overall decrease ins and outs to aim for 4 liters in and out instead of 8 liters.  Still no indications for HD at this time.  Patient being considered for rehab.  Corliss Parish, MD 02/25/2012

## 2012-02-26 LAB — CBC WITH DIFFERENTIAL/PLATELET
Basophils Absolute: 0.1 10*3/uL (ref 0.0–0.1)
Basophils Relative: 1 % (ref 0–1)
Eosinophils Absolute: 0.2 10*3/uL (ref 0.0–0.7)
Hemoglobin: 12.9 g/dL — ABNORMAL LOW (ref 13.0–17.0)
MCH: 31.2 pg (ref 26.0–34.0)
MCHC: 34.1 g/dL (ref 30.0–36.0)
Monocytes Absolute: 1.1 10*3/uL — ABNORMAL HIGH (ref 0.1–1.0)
Monocytes Relative: 17 % — ABNORMAL HIGH (ref 3–12)
Neutro Abs: 4.1 10*3/uL (ref 1.7–7.7)
Neutrophils Relative %: 63 % (ref 43–77)
RDW: 14.7 % (ref 11.5–15.5)

## 2012-02-26 LAB — GLUCOSE, CAPILLARY
Glucose-Capillary: 147 mg/dL — ABNORMAL HIGH (ref 70–99)
Glucose-Capillary: 113 mg/dL — ABNORMAL HIGH (ref 70–99)
Glucose-Capillary: 135 mg/dL — ABNORMAL HIGH (ref 70–99)

## 2012-02-26 LAB — HEPATIC FUNCTION PANEL
ALT: 294 U/L — ABNORMAL HIGH (ref 0–53)
AST: 155 U/L — ABNORMAL HIGH (ref 0–37)
Alkaline Phosphatase: 107 U/L (ref 39–117)
Bilirubin, Direct: 0.2 mg/dL (ref 0.0–0.3)
Indirect Bilirubin: 0.4 mg/dL (ref 0.3–0.9)

## 2012-02-26 LAB — RENAL FUNCTION PANEL
BUN: 76 mg/dL — ABNORMAL HIGH (ref 6–23)
CO2: 30 mEq/L (ref 19–32)
Calcium: 7.7 mg/dL — ABNORMAL LOW (ref 8.4–10.5)
Creatinine, Ser: 8.64 mg/dL — ABNORMAL HIGH (ref 0.50–1.35)
Glucose, Bld: 118 mg/dL — ABNORMAL HIGH (ref 70–99)
Phosphorus: 8.3 mg/dL — ABNORMAL HIGH (ref 2.3–4.6)
Sodium: 140 mEq/L (ref 135–145)

## 2012-02-26 MED ORDER — FUROSEMIDE 10 MG/ML IJ SOLN
40.0000 mg | Freq: Two times a day (BID) | INTRAMUSCULAR | Status: DC
  Administered 2012-02-26 – 2012-02-29 (×6): 40 mg via INTRAVENOUS
  Filled 2012-02-26 (×8): qty 4

## 2012-02-26 NOTE — Progress Notes (Signed)
[facility]  Subjective:  No nausea, no vomiting, no shortness of breath. No new complaints   Objective: Vital signs in last 24 hours: Blood pressure 156/91, pulse 75, temperature 97.8 F (36.6 C), temperature source Oral, resp. rate 18, height 6' (1.829 m), weight 263 lb 7.2 oz (119.5 kg), SpO2 95.00%.    PHYSICAL EXAM Gen: NAD, laying in bed  Chest: CTA b/l Cardio: regular rate and rhythm, S1, S2 normal, no murmur GI: soft, non-tender; bowel sounds normal; distended  Extremities: no edema Neurologic: No focal deficit  Lab Results:   Lab 02/26/12 0600 02/25/12 0500 02/24/12 0445  NA 140 137 136  K 3.9 4.2 4.9  CL 96 93* 95*  CO2 30 27 21   BUN 76* 80* 76*  CREATININE 8.64* 9.29* 9.54*  ALB -- -- --  GLUCOSE 118* -- --  CALCIUM 7.7* 7.0* 6.6*  PHOS 8.3* 9.2* 8.9*     Basename 02/26/12 0600 02/25/12 0500  WBC 6.6 5.6  HGB 12.9* 11.3*  HCT 37.8* 34.1*  PLT 203 151     Scheduled Meds:    . aspirin  325 mg Oral Daily  . calcium carbonate  400 mg of elemental calcium Oral TID WC  . carvedilol  3.125 mg Oral BID WC  . folic acid  1 mg Oral Daily  . furosemide  80 mg Intravenous BID  . isosorbide-hydrALAZINE  1 tablet Oral TID  . multivitamin with minerals  1 tablet Oral Daily  . thiamine  100 mg Oral Daily  . traZODone  100 mg Oral QHS   Continuous Infusions:    . sodium chloride 175 mL/hr at 02/26/12 0834   PRN Meds:.ondansetron (ZOFRAN) IV, oxyCODONE  Assessment/Plan: Pt is a 57 y.o. yo male with a PMHx of schizophrenia, HTN, Hep C, h/o polysubstance abuse who was admitted to [facility] on 02/19/2012 for evaluation of altered mental status with confusion and slurred speech, found to have cerebellar stroke on MRI   # Acute renal failure: Creatinine on admission: 2.34. Baseline Cr: 1.2 on 02/08/12. Acute renal failure likely multifactorial from ATN from hypotension, ACEI and NSAID use along with rhabdomyolysis kidney injury. 8.6 today from 9.29  with improving BUN.    He is nonoliguric. Not uremic. Creatinine trending down. UOP continues to be robust with 7.8L out, neg -5.7L balance in 24hrs.  - consider stopping lasix and continuing IV hydration since patient may be becoming dry.  - ANA, ANCA negative, GBM negative. C3,C4 negative.  - abdominal US: no hydronephrosis.  - SPEP: no M spike, kappa/lambda ratio normal - no acute need for dialysis.   # Rhabdomyolisis: CK trending down.  - hypocalcemia and hyperphosphatemia improving with improved renal function. continue tums.  - continue IV hydration   # Anion gap acidosis: resolved s/p bicarb drip. Monitor for alkalosis. # Hypotension: resolved. Now hypertensive. On carvedilol and imdur  # CVA and altered mental status: followed by neurology. AMS improved.  # Transaminitis: GI consulted. Known hepatitis C. Hep A and B negative. Tylenol<15. Ethanol:105. LFTs improving.  # UTI: urine culture: no growth. Ceftriaxone d/ced # Schizophrenia: per primary team  # Alcohol abuse: on CIWA with thiamine, folic acid and multivit.  # PFO: not currently anticoagulated given low platelet count  [name] [name], PGY-2  Family Medicine Resident     LOS: 7 days   Patient seen and examined, agree with above note with above modifications. Seems to be recovering.  Would like to decrease amount of ins and outs.  Can deep the IV at 125-175 per hour and we will decrease the lasix to 40 IV q 12, hopefully numbers will still continue to trend in right direction.  No indications for HD.  Corliss Parish, MD 02/26/2012     [name] [name] 02/26/2012,8:40 AM

## 2012-02-26 NOTE — Progress Notes (Signed)
Internal Medicine Attending  Date: 02/26/2012  Patient name: [name] [name] Medical record number: [id number] Date of birth: August 28, 1955 Age: 57 y.o. Gender: male  I saw and evaluated the patient, and discussed his care on a.m. rounds with house staff. I reviewed the resident's note by Dr. Sandy Salaam and I agree with the resident's findings and plans as documented in her note.  Dr. Tommy Medal will take over as attending physician on our service, and is the on-call attending physician this weekend.

## 2012-02-26 NOTE — Progress Notes (Signed)
Physical Therapy Treatment Patient Details Name: [name] [name] MRN: [id number] DOB: 1955/11/25 Today's Date: 02/26/2012 Time: [id number] PT Time Calculation (min): 26 min  PT Assessment / Plan / Recommendation Comments on Treatment Session  57 yo s/p bil cerebellar CVAs and Lt hip pain due to fall. Pt progressing well as pain has improved. He reports numbness in Lt foot has improved as well. Pt very motivated and will do well with therapies.    Follow Up Recommendations  CIR                 Equipment Recommendations  Rolling walker with 5" wheels        Frequency Min 4X/week   Plan Discharge plan remains appropriate;Frequency remains appropriate    Precautions / Restrictions Precautions Precautions: Fall   Pertinent Vitals/Pain Lt hip 4-6/10 with activity; repositioned    Mobility  Bed Mobility Bed Mobility: Not assessed Transfers Transfers: Sit to Stand;Stand to Sit Sit to Stand: 4: Min guard;With upper extremity assist;With armrests;From chair/3-in-1 Stand to Sit: 4: Min assist;Without upper extremity assist;To chair/3-in-1 Details for Transfer Assistance: x 2 each; no cues for sequencing with RW; required assist to control descent when asked not to use his UEs to sit down Ambulation/Gait Ambulation/Gait Assistance: 4: Min guard Ambulation Distance (Feet): 160 Feet Assistive device: Rolling walker Ambulation/Gait Assistance Details: still favoring LLE due to hip pain (reports he fell onto Lt side and laid there about 30 minutes not able to get up); vc for upright posture and safe use of RW Gait Pattern: Step-to pattern;Decreased stride length;Wide base of support Gait velocity: decreased Modified Rankin (Stroke Patients Only) Pre-Morbid Rankin Score: No symptoms Modified Rankin: Moderately severe disability    Exercises General Exercises - Lower Extremity Hip ABduction/ADduction: AROM;Left;5 reps;Standing Hip Flexion/Marching: AROM;Left;Right;5  reps;Standing Other Exercises Other Exercises: standing knee flexion Lt x 5; standing Lt hip ext x 5        PT Goals Acute Rehab PT Goals Pt will go Sit to Stand: with supervision PT Goal: Sit to Stand - Progress: Progressing toward goal Pt will go Stand to Sit: with supervision PT Goal: Stand to Sit - Progress: Progressing toward goal Pt will Stand: with supervision;3 - 5 min;with unilateral upper extremity support PT Goal: Stand - Progress: Progressing toward goal Pt will Ambulate: >150 feet;with least restrictive assistive device;with supervision PT Goal: Ambulate - Progress: Progressing toward goal  Visit Information  Last PT Received On: 02/26/12 Assistance Needed: +1    Subjective Data  Subjective: States he's looking forward to more therapy Patient Stated Goal: To get stronger   Cognition  Overall Cognitive Status: Appears within functional limits for tasks assessed/performed Arousal/Alertness: Awake/alert Orientation Level: Appears intact for tasks assessed Behavior During Session: [facility] for tasks performed    Balance  Balance Balance Assessed: Yes Static Standing Balance Static Standing - Balance Support: No upper extremity supported Static Standing - Level of Assistance: 4: Min assist Rhomberg - Eyes Opened: 30  (heels together, toes turned out) Rhomberg - Eyes Closed: 30  (heels together, toes turned out; slight incr sway) Dynamic Standing Balance Dynamic Standing - Balance Support: Bilateral upper extremity supported Dynamic Standing - Level of Assistance: 5: Stand by assistance Dynamic Standing - Balance Activities: Lateral lean/weight shifting;Forward lean/weight shifting;Other (comment) (standing exercises)  End of Session PT - End of Session Equipment Utilized During Treatment: Gait belt Activity Tolerance: Patient tolerated treatment well Patient left: in chair;with call bell/phone within reach;with family/visitor present   GP  [name] [name] 02/26/2012, 4:30 PM Pager [phone]

## 2012-02-26 NOTE — Progress Notes (Signed)
Subjective: Sleepy this morning but in good spirits. Says pain is well controlled. Some nausea but no vomiting. UOP of 7.8 L yesterday, net negative about 5L. Denies CP, SOB, dizziness, diarrhea.  Objective: Vital signs in last 24 hours: Filed Vitals:   02/25/12 1800 02/25/12 2200 02/26/12 0200 02/26/12 0552  BP: 175/103 158/104 150/95 156/91  Pulse: 55 78 74 75  Temp:  98 F (36.7 C) 98.1 F (36.7 C) 97.8 F (36.6 C)  TempSrc:      Resp: 18 18 18 18   Height:      Weight:      SpO2: 99% 99% 97% 95%   Weight change:   Intake/Output Summary (Last 24 hours) at 02/26/12 0758 Last data filed at 02/26/12 0345  Gross per 24 hour  Intake 2129.08 ml  Output   7025 ml  Net -4895.92 ml  General: lying in bed sleeping when I enter HEENT: R pupil irregular shape (prior cataract surgery), MMM of OP, EOMI  Cardiac: RRR, no rubs, murmurs or gallops  Pulm: fine bibasilar crackles Abd: minimally TTP, normoactive BS Ext: warm and well perfused, no foot edema Neuro:  Sleepy but alert and oriented upon wakening CN2-12 intact  Sensation intact face, b/l upper and lower extremities  Strength 5/5 face bilateral upper and lower extremities    Lab Results: Basic Metabolic Panel:  Lab 0000000 0600 02/25/12 0500 02/23/12 0309  NA 140 137 --  K 3.9 4.2 --  CL 96 93* --  CO2 30 27 --  GLUCOSE 118* 99 --  BUN 76* 80* --  CREATININE 8.64* 9.29* --  CALCIUM 7.7* 7.0* --  MG -- -- 1.8  PHOS 8.3* 9.2* --   Liver Function Tests:  Lab 02/26/12 0600 02/25/12 0500  AST 155* 247*  ALT 294* 409*  ALKPHOS 107 115  BILITOT 0.6 0.6  PROT 6.4 6.0  ALBUMIN 2.8*2.7* 2.5*2.4*   CBC:  Lab 02/26/12 0600 02/25/12 0500  WBC 6.6 5.6  NEUTROABS 4.1 3.6  HGB 12.9* 11.3*  HCT 37.8* 34.1*  MCV 91.3 90.7  PLT 203 151   Cardiac Enzymes:  Lab 02/26/12 0600 02/25/12 0500 02/24/12 0445 02/23/12 1849  CKTOTAL 36644* 19095* 32139* --  CKMB -- 11.2* 18.4* 23.1*  CKMBINDEX -- -- -- --  TROPONINI  -- <0.30 0.50* 0.64*   D-Dimer:  Lab 02/22/12 1425  DDIMER 7.68*   CBG:  Lab 02/26/12 0639 02/25/12 1719 02/25/12 1150 02/25/12 0759 02/24/12 2151 02/24/12 1715  GLUCAP 147* 112* 119* 123* 112* 104*   Hemoglobin A1C:  Lab 02/20/12 0640  HGBA1C 5.8*   Fasting Lipid Panel:  Lab 02/20/12 0640  CHOL 245*  HDL 55  LDLCALC 121*  TRIG 345*  CHOLHDL 4.5  LDLDIRECT --   Thyroid Function Tests:  Lab 02/22/12 1141  TSH 3.160  T4TOTAL --  FREET4 --  T3FREE --  THYROIDAB --   Coagulation:  Lab 02/22/12 1425 02/22/12 0455 02/19/12 2308  LABPROT 14.6 14.6 15.0  INR 1.16 1.16 1.20   Urine Drug Screen: Drugs of Abuse     Component Value Date/Time   LABOPIA NONE DETECTED 02/20/2012 0105   COCAINSCRNUR NONE DETECTED 02/20/2012 0105   LABBENZ NONE DETECTED 02/20/2012 0105   AMPHETMU NONE DETECTED 02/20/2012 0105   THCU NONE DETECTED 02/20/2012 0105   LABBARB NONE DETECTED 02/20/2012 0105    Alcohol Level:  Lab 02/19/12 2321  ETH 105*   Urinalysis:  Lab 02/20/12 0104  COLORURINE AMBER*  LABSPEC 1.021  PHURINE 5.0  GLUCOSEU NEGATIVE  HGBUR LARGE*  BILIRUBINUR NEGATIVE  KETONESUR NEGATIVE  PROTEINUR >300*  UROBILINOGEN 0.2  NITRITE NEGATIVE  LEUKOCYTESUR TRACE*    Micro Results: Recent Results (from the past 240 hour(s))  URINE CULTURE     Status: Normal   Collection Time   02/21/12 10:15 AM      Component Value Range Status Comment   Specimen Description URINE, CATHETERIZED   Final    Special Requests NONE   Final    Culture  Setup Time 02/21/2012 21:40   Final    Colony Count NO GROWTH   Final    Culture NO GROWTH   Final    Report Status 02/22/2012 FINAL   Final   MRSA PCR SCREENING     Status: Normal   Collection Time   02/21/12 12:58 PM      Component Value Range Status Comment   MRSA by PCR NEGATIVE  NEGATIVE Final    Studies/Results: No results found. Medications: I have reviewed the patient's current medications. Scheduled Meds:    . aspirin   325 mg Oral Daily  . calcium carbonate  400 mg of elemental calcium Oral TID WC  . carvedilol  3.125 mg Oral BID WC  . folic acid  1 mg Oral Daily  . furosemide  80 mg Intravenous BID  . isosorbide-hydrALAZINE  1 tablet Oral TID  . multivitamin with minerals  1 tablet Oral Daily  . thiamine  100 mg Oral Daily  . traZODone  100 mg Oral QHS   Continuous Infusions:    . sodium chloride 175 mL/hr at 02/26/12 0230   PRN Meds:.ondansetron (ZOFRAN) IV, oxyCODONE  Assessment/Plan:  [name] [name] is 57 y.o. male with a pmh of schizophrenia, HTN, Hep C, h/o polysubstance abuse (cocaine/EtOH) admitted with confusion and found to have multiple cerebellar infarcts, rhabdomyolysis, and acute renal failure and transaminitis.   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 LE dopplers negative for DVT - Continue ASA 325mg  for secondary stroke prophylaxis  - A1C 5.8 --> nondiabetic  - LDL 121--> statin therapy contraindicated w transaminitis and rhabdo  -PT/OT: recommend CIR, pt evaluated and accepted by rehab MD for transfer when medically stable. Expect Monday.   2) Acute renal failure  Patient presents with ARF. Baseline Cr is around 1.2-1.3. Cr on admission 2.4 and peaked at 9.5 on 1/29, today downtrending at 8.6.  UA on admission notable for granular and RBC casts. Suspect pigment-induced renal failure in setting of rhabdo w CK >50,000. May also be ATN from hypotension, ACEi/NSAIDS vs AIN vs glomerulonephritis. UOP continues to improve, net 5L negative yesterday on lasix 80 BID and NS 175cc/hr.  No urgent indication for HD now. K is normal at 3.9, no evidence of volume overload/uremia.   - Renal following, appreciate input. Bicarb drip discontinued 02/24/12. Lasix continues at 80 BID. Aim for 4L in (NS175 cc/hr)   - Negative ANA, ANCA, GBM, C3, C4, SPEP - Pending labs:  cryoglobulin  - follow urine output   Lab Results  Component Value Date   CREATININE 8.64* 02/26/2012    3) Transaminitis  Patient with transaminitis, preserved synthetic function of liver. Acute hepatitis and HIV negative. Chronic Hep C. Negative tylenol level. Negative ANA. Abdominal ultrasound demonstrates dense hepatic architecture suspicious for cirrhosis, no evidence of infarction. CBD also enlarged at 11 without clear etiology, no gall stones.  Suspect multifactorial : rhabdo, chronic + binge EtOH use, and shock liver in setting of hypotension. Transaminases are downtrending. GI following.  - Continue to follow transaminases. Much improvement. - Will need nonurgent MRI abd+MRCP, per GI   Lab Results  Component Value Date   ALT 294* 02/26/2012   AST 155* 02/26/2012   ALKPHOS 107 02/26/2012   BILITOT 0.6 02/26/2012    4) Rhabdomyolysis  On initial testing, patient's CK above upper limits normal >50,000. Etiology not clear. No prolonged immobilization. UDS negative for cocaine, although has h/o use. Not on statin therapy, has been on fenofibrate for hyperTG, but rhabdo would be rare SE. Is chronic drinker with frequent binging and EtOH level 105 on admission. Denies any symptoms of antecedent viral illness prior to admission on our questioning, although Dr. Barkley Bruns note documents endorsement of 1 week N/V/D prior to admission.  Was managed aggressively w IVF boluses, bicarb drip and lasix QID. Bicarb drip discontinued 02/24/12, no on lasix 80mg  IV BID and NS at 175cc/hr. No hyperkalemia today (K 3.9). CK continues to downtrend and is 12,800 today. - Decrease Lasix to 80mg  BID - NS 175 cc/hr for goal ins of 4L. - Calcium 7.0, corrects to 8.2 with low albumin. Likely related to rhabdo and loop diuresis. Continue tums.  - Follow uric acid, phosphorous Lab Results  Component Value Date   CKTOTAL 65784* 02/26/2012   CKMB 11.2* 02/25/2012   TROPONINI <0.30 02/25/2012    5) Thrombocytopenia   Resolved. Platelets decreased from 168 on admission to 78 on 02/23/12. Platelets 203 today.     6) Anion Gap Metabolic Acidosis  AG is 14 today, with bicarbonate of 30. Likely in setting of renal failure.  - Continue to monitor daily   7) Congestive heart failure w EF 30-35% Patient noted to have poor EF on echo, no previous dx CHF. Dr. Einar Gip with Cardiology following, recommends Bidil 3 times a day along with low dose beta blocker coreg 3.125 mg PO bid, although patient is presently in well compensated congestive heart failure. Not a candidate for ACEi or ARB due to ARF.   - Coreg, bidil  8) ? UTI  Trace leuks and cloudy urine. Had leukocytosis on admission which has resolved. No organism on culture. Received abx therapy with ceftriaxone for several days, was discontinued 3 days ago.   9) EtOH abuse  On CIWA.   10) Hypertension  Had on episode of hypotension during admission but now hypertensive. Not a candidate for ACEi or ARB due to ARF.   - bidil TID and coreg.   Dispo: CIR accepted, when medically stable (anticipate 02/29/12)  The patient does  have a current PCP ([facility]), therefore will be requiring [facility] follow-up after discharge.   The patient does not have transportation limitations that hinder transportation to clinic appointments.  Services Needed at time of discharge: Y = Yes, Blank = No PT:   OT:   RN:   Equipment:   Other:     LOS: 7 days   Tonia Brooms 02/26/2012, 7:58 AM

## 2012-02-27 LAB — HEPATIC FUNCTION PANEL
Bilirubin, Direct: 0.2 mg/dL (ref 0.0–0.3)
Indirect Bilirubin: 0.3 mg/dL (ref 0.3–0.9)
Total Protein: 6.8 g/dL (ref 6.0–8.3)

## 2012-02-27 LAB — CBC WITH DIFFERENTIAL/PLATELET
Basophils Relative: 1 % (ref 0–1)
Eosinophils Absolute: 0.2 10*3/uL (ref 0.0–0.7)
Eosinophils Relative: 3 % (ref 0–5)
HCT: 39.5 % (ref 39.0–52.0)
Hemoglobin: 13.2 g/dL (ref 13.0–17.0)
Lymphocytes Relative: 19 % (ref 12–46)
Monocytes Relative: 17 % — ABNORMAL HIGH (ref 3–12)
Neutrophils Relative %: 60 % (ref 43–77)
RBC: 4.3 MIL/uL (ref 4.22–5.81)
WBC: 6.9 10*3/uL (ref 4.0–10.5)

## 2012-02-27 LAB — RENAL FUNCTION PANEL
Calcium: 8.3 mg/dL — ABNORMAL LOW (ref 8.4–10.5)
GFR calc Af Amer: 8 mL/min — ABNORMAL LOW (ref >90)
GFR calc non Af Amer: 7 mL/min — ABNORMAL LOW (ref >90)
Phosphorus: 7.5 mg/dL — ABNORMAL HIGH (ref 2.3–4.6)
Potassium: 4.4 mEq/L (ref 3.5–5.1)
Sodium: 137 mEq/L (ref 135–145)

## 2012-02-27 LAB — GLUCOSE, CAPILLARY

## 2012-02-27 LAB — CREATININE, SERUM
Creatinine, Ser: 7.25 mg/dL — ABNORMAL HIGH (ref 0.50–1.35)
GFR calc Af Amer: 9 mL/min — ABNORMAL LOW (ref >90)

## 2012-02-27 LAB — MAGNESIUM: Magnesium: 1.5 mg/dL (ref 1.5–2.5)

## 2012-02-27 LAB — CK: Total CK: 7789 U/L — ABNORMAL HIGH (ref 7–232)

## 2012-02-27 MED ORDER — MAGNESIUM OXIDE 400 (241.3 MG) MG PO TABS
200.0000 mg | ORAL_TABLET | Freq: Two times a day (BID) | ORAL | Status: DC
  Administered 2012-02-27 (×2): 200 mg via ORAL
  Administered 2012-02-28: 12:00:00 via ORAL
  Administered 2012-02-28 – 2012-02-29 (×2): 200 mg via ORAL
  Filled 2012-02-27 (×6): qty 0.5

## 2012-02-27 MED ORDER — ACETAMINOPHEN 325 MG PO TABS
325.0000 mg | ORAL_TABLET | Freq: Four times a day (QID) | ORAL | Status: DC | PRN
  Administered 2012-02-27 – 2012-02-29 (×6): 325 mg via ORAL
  Filled 2012-02-27 (×3): qty 1
  Filled 2012-02-27: qty 2
  Filled 2012-02-27 (×2): qty 1

## 2012-02-27 MED ORDER — CARVEDILOL 6.25 MG PO TABS
6.2500 mg | ORAL_TABLET | Freq: Two times a day (BID) | ORAL | Status: DC
  Administered 2012-02-27 – 2012-02-29 (×5): 6.25 mg via ORAL
  Filled 2012-02-27 (×6): qty 1

## 2012-02-27 NOTE — Progress Notes (Addendum)
Subjective: [name] [name] was seen and examined at bedside. He reports intermittent headaches for the past two days.  Currently the headache has resolved and otherwise he feels like his pain is well controlled.   UOP of 6.9 L yesterday, net negative about 1.8L. Denies CP, SOB, dizziness, diarrhea.  Objective: Vital signs in last 24 hours: Filed Vitals:   02/26/12 1800 02/26/12 2143 02/27/12 0137 02/27/12 0517  BP: 174/99 148/94 153/77 165/103  Pulse: 71 69 83 68  Temp: 98.3 F (36.8 C) 98.1 F (36.7 C) 98.5 F (36.9 C) 98.3 F (36.8 C)  TempSrc: Oral Oral Oral Oral  Resp: 18 18 20 20   Height:      Weight:      SpO2: 98% 100% 99% 97%   Weight change:   Intake/Output Summary (Last 24 hours) at 02/27/12 0815 Last data filed at 02/27/12 0600  Gross per 24 hour  Intake 5076.58 ml  Output   6900 ml  Net -1823.42 ml  General: HEENT: R pupil irregular shape (prior cataract surgery), MMM of OP, EOMI  Cardiac: RRR, no rubs, murmurs or gallops  Pulm: minimal bibasilar crackles, -wheezing Abd: non tender to palpation, normoactive BS, soft Ext: warm and well perfused, minimal pitting edema b/l lower extremities Neuro: AAOX3,CN2-12 intact Sensation intact face, b/l upper and lower extremities and strength 5/5 face bilateral upper and lower extremities   Lab Results: Basic Metabolic Panel:  Lab 99991111 0500 02/26/12 0600 02/23/12 0309  NA 137 140 --  K 4.4 3.9 --  CL 98 96 --  CO2 27 30 --  GLUCOSE 114* 118* --  BUN 69* 76* --  CREATININE 7.25*7.37* 8.64* --  CALCIUM 8.3* 7.7* --  MG 1.5 -- 1.8  PHOS 7.5*7.4* 8.3* --   Liver Function Tests:  Lab 02/27/12 0500 02/26/12 0600  AST 114* 155*  ALT 235* 294*  ALKPHOS 102 107  BILITOT 0.5 0.6  PROT 6.8 6.4  ALBUMIN 3.0*2.9* 2.8*2.7*   CBC:  Lab 02/27/12 0500 02/26/12 0600  WBC 6.9 6.6  NEUTROABS 4.1 4.1  HGB 13.2 12.9*  HCT 39.5 37.8*  MCV 91.9 91.3  PLT 247 203   Cardiac Enzymes:  Lab 02/27/12 0500 02/26/12  0600 02/25/12 0500 02/24/12 0445 02/23/12 1849  CKTOTAL 7789* 12802* 19095* -- --  CKMB -- -- 11.2* 18.4* 23.1*  CKMBINDEX -- -- -- -- --  TROPONINI -- -- <0.30 0.50* 0.64*   D-Dimer:  Lab 02/22/12 1425  DDIMER 7.68*   CBG:  Lab 02/27/12 0640 02/26/12 2147 02/26/12 1616 02/26/12 1201 02/26/12 0639 02/25/12 2202  GLUCAP 164* 135* 113* 113* 147* 106*   Thyroid Function Tests:  Lab 02/22/12 1141  TSH 3.160  T4TOTAL --  FREET4 --  T3FREE --  THYROIDAB --   Coagulation:  Lab 02/22/12 1425 02/22/12 0455  LABPROT 14.6 14.6  INR 1.16 1.16   Urine Drug Screen: Drugs of Abuse     Component Value Date/Time   LABOPIA NONE DETECTED 02/20/2012 0105   COCAINSCRNUR NONE DETECTED 02/20/2012 0105   LABBENZ NONE DETECTED 02/20/2012 0105   AMPHETMU NONE DETECTED 02/20/2012 0105   THCU NONE DETECTED 02/20/2012 0105   LABBARB NONE DETECTED 02/20/2012 0105    Micro Results: Recent Results (from the past 240 hour(s))  URINE CULTURE     Status: Normal   Collection Time   02/21/12 10:15 AM      Component Value Range Status Comment   Specimen Description URINE, CATHETERIZED   Final    Special  Requests NONE   Final    Culture  Setup Time 02/21/2012 21:40   Final    Colony Count NO GROWTH   Final    Culture NO GROWTH   Final    Report Status 02/22/2012 FINAL   Final   MRSA PCR SCREENING     Status: Normal   Collection Time   02/21/12 12:58 PM      Component Value Range Status Comment   MRSA by PCR NEGATIVE  NEGATIVE Final    Medications: I have reviewed the patient's current medications. Scheduled Meds:    . aspirin  325 mg Oral Daily  . calcium carbonate  400 mg of elemental calcium Oral TID WC  . carvedilol  3.125 mg Oral BID WC  . folic acid  1 mg Oral Daily  . furosemide  40 mg Intravenous BID  . isosorbide-hydrALAZINE  1 tablet Oral TID  . multivitamin with minerals  1 tablet Oral Daily  . thiamine  100 mg Oral Daily  . traZODone  100 mg Oral QHS   Continuous Infusions:     . sodium chloride 175 mL/hr at 02/27/12 0535   PRN Meds:.ondansetron (ZOFRAN) IV, oxyCODONE  Assessment/Plan:  [name] [name] is 57 y.o. male with a pmh of schizophrenia, HTN, Hep C, h/o polysubstance abuse (cocaine/EtOH) admitted with confusion and found to have multiple cerebellar infarcts, rhabdomyolysis, and acute renal failure and transaminitis.   1) Multiple acute cerebellar infarcts and chronic ischemic changes  Suspect embolic source due to distribution, supported by TEE this admission which showed PFO and R-->L shunt.  LE dopplers negative for DVT - Continue ASA 325mg  for secondary stroke prophylaxis  - A1C 5.8 --> nondiabetic  - LDL 121--> statin therapy contraindicated w transaminitis and rhabdo  - PT/OT: recommend CIR, pt evaluated and accepted by rehab MD for transfer when medically stable. Expect Monday 2/3.   2) Acute renal failure--Baseline Cr is around 1.2-1.3. Cr on admission 2.4 and peaked at 9.5 on 1/29, now downtrending--7.25 (02/27/12)  UA on admission notable for granular and RBC casts. Suspect pigment-induced renal failure in setting of rhabdo w CK >50,000. May also be ATN from hypotension, ACEi/NSAIDS vs AIN vs glomerulonephritis. UOP continues to improve, net 6.7L negative yesterday with decreased lasix 40 IV BID and NS 175cc/hr.  No urgent indication for HD now. K wnl, no evidence of volume overload/uremia.   - Renal following - OFF Bicarb drip since 02/24/12. Lasix decreased to 40 BID - Negative ANA, ANCA, GBM, C3, C4, SPEP, cryoglobulin none detected - strict I/O--trend urine output   Lab Results  Component Value Date   CREATININE 7.25* 02/27/2012   CREATININE 7.37* 02/27/2012    3) Transaminitis--preserved synthetic function of liver.  Acute hepatitis and HIV negative. Chronic Hep C. Negative tylenol level. Negative ANA.  Abdominal ultrasound demonstrates dense hepatic architecture suspicious for cirrhosis, no evidence of infarction. CBD also enlarged at 11  without clear etiology, no gall stones.  Suspect multifactorial : rhabdo, chronic + binge EtOH use, and shock liver in setting of hypotension. Transaminases are downtrending.  - GI following.  - Trend LFTs. Much improvement. - Will need nonurgent MRI abd+MRCP, per GI   Lab Results  Component Value Date   ALT 235* 02/27/2012   AST 114* 02/27/2012   ALKPHOS 102 02/27/2012   BILITOT 0.5 02/27/2012   4) Rhabdomyolysis  Fell on left hip prior to admission.  On initial testing, patient's CK above upper limits normal >50,000. Etiology not clear. No  prolonged immobilization. UDS negative for cocaine, although has h/o use.  Not on statin therapy, has been on fenofibrate for hyperTG, but rhabdo would be rare SE. Is chronic drinker with frequent binging and EtOH level 105 on admission. Denies any symptoms of antecedent viral illness prior to admission on our questioning, although Dr. Barkley Bruns note documents endorsement of 1 week N/V/D prior to admission.  Was managed aggressively w IVF boluses, bicarb drip and lasix QID. Bicarb drip discontinued 02/24/12, on lasix BID and NS at 175cc/hr. No hyperkalemia. CK continues to downtrend and is 7789 today. - Renal following, appreciate continued input - Decreased Lasix to 40mg  BID - NS 175 cc/hr for goal ins of 4L. - Calcium 8.3.  Likely related to rhabdo and loop diuresis. Continue tums.  - Follow uric acid 16.1, phosphorous 7.5 Lab Results  Component Value Date   CKTOTAL 7789* 02/27/2012   CKMB 11.2* 02/25/2012   TROPONINI <0.30 02/25/2012   5) Thrombocytopenia  Resolved. Platelets decreased from 168 on admission to 78 on 02/23/12. Platelets 247 02/27/12.     6) Anion Gap Metabolic Acidosis  AG 99991111 today.  Likely in setting of renal failure.  - Trend bmet   7) Congestive heart failure w EF 30-35% Noted to have poor EF on echo, no previous dx CHF.  Presently well compensated congestive heart failure. Not a candidate for ACEi or ARB due to ARF.  Seen by  cardiology, Dr. Einar Gip.   - Coreg BID and Bidil TID  8) ? UTI  Trace leuks and cloudy urine. Had leukocytosis on admission which has resolved. No organism on culture. Received abx therapy with ceftriaxone for several days, d/c 1/27.     9) EtOH abuse  On CIWA   10) Hypertension  Had on episode of hypotension during admission but now hypertensive. Not a candidate for ACEi or ARB due to ARF.   - bidil TID and increased dose of coreg to 6.25mg  BID  Diet: Regular DVT Ppx: SCDs, thrombocytopenia during hospital course and acute renal failure. PT following.   Dispo: CIR accepted, when medically stable (anticipate 02/29/12)  The patient does have a current PCP ([facility]), therefore will be requiring [facility] follow-up after discharge.   The patient does not have transportation limitations that hinder transportation to clinic appointments.  Services Needed at time of discharge: Y = Yes, Blank = No PT: CIR  OT:   RN:   Equipment: Rolling walker with 5" wheels  Other:     LOS: 8 days   Jerene Pitch 02/27/2012, 8:15 AM

## 2012-02-27 NOTE — Progress Notes (Signed)
[facility]  Subjective:  No new complaints. Great UOP and creatinine continuing to improve.  BP has still been a little high   Objective: Vital signs in last 24 hours: Blood pressure 165/103, pulse 68, temperature 98.3 F (36.8 C), temperature source Oral, resp. rate 20, height 6' (1.829 m), weight 119.5 kg (263 lb 7.2 oz), SpO2 97.00%.    PHYSICAL EXAM Gen: NAD, laying in bed  Chest: CTA b/l Cardio: regular rate and rhythm, S1, S2 normal, no murmur GI: soft, non-tender; bowel sounds normal; distended  Extremities: no edema Neurologic: No focal deficit  Lab Results:   Lab 02/27/12 0500 02/26/12 0600 02/25/12 0500  NA 137 140 137  K 4.4 3.9 4.2  CL 98 96 93*  CO2 27 30 27   BUN 69* 76* 80*  CREATININE 7.25*7.37* 8.64* 9.29*  ALB -- -- --  GLUCOSE 114* -- --  CALCIUM 8.3* 7.7* 7.0*  PHOS 7.5*7.4* 8.3* 9.2*     Basename 02/27/12 0500 02/26/12 0600  WBC 6.9 6.6  HGB 13.2 12.9*  HCT 39.5 37.8*  PLT 247 203     Scheduled Meds:    . aspirin  325 mg Oral Daily  . calcium carbonate  400 mg of elemental calcium Oral TID WC  . carvedilol  3.125 mg Oral BID WC  . folic acid  1 mg Oral Daily  . furosemide  40 mg Intravenous BID  . isosorbide-hydrALAZINE  1 tablet Oral TID  . multivitamin with minerals  1 tablet Oral Daily  . thiamine  100 mg Oral Daily  . traZODone  100 mg Oral QHS   Continuous Infusions:    . sodium chloride 175 mL/hr at 02/27/12 0535   PRN Meds:.ondansetron (ZOFRAN) IV, oxyCODONE  Assessment/Plan: Pt is a 57 y.o. 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 Acute renal failure likely multifactorial from ATN from hypotension, ACEI and NSAID use along with rhabdomyolysis kidney injury, never  required HD. Creatinine trending down last few days, now in the 7s.  great UOP on low dose lasix.  Would like to continue low dose lasix because would like to keep him a little more out than in given high BP, but continue IVF given CK still up at 7000.  Other work up is negative.  Is OK to d/c foley  # Rhabdomyolisis: CK trending down.  - hypocalcemia and hyperphosphatemia improving with improved renal function. continue tums.  - continue IV hydration   # Anion gap acidosis: resolved. # Hypotension: resolved. Now hypertensive. On carvedilol and imdur.  Think is a little bit overloaded, see #AFRF # CVA and altered mental status: followed by neurology. AMS improved.  # Transaminitis: GI consulted. Known hepatitis C. Hep A and B negative. Tylenol<15. Ethanol:105. LFTs improving.  # UTI: urine culture: no growth. Ceftriaxone d/ced # Schizophrenia: per primary team  # Alcohol abuse: on CIWA with thiamine, folic acid and multivit.  # PFO: not currently anticoagulated given low platelet count    LOS: 8 days      Blease Capaldi A 02/27/2012,11:13 AM

## 2012-02-28 ENCOUNTER — Inpatient Hospital Stay (HOSPITAL_COMMUNITY): Payer: PRIVATE HEALTH INSURANCE

## 2012-02-28 LAB — GLUCOSE, CAPILLARY
Glucose-Capillary: 119 mg/dL — ABNORMAL HIGH (ref 70–99)
Glucose-Capillary: 93 mg/dL (ref 70–99)
Glucose-Capillary: 135 mg/dL — ABNORMAL HIGH (ref 70–99)

## 2012-02-28 LAB — HEPATIC FUNCTION PANEL
ALT: 182 U/L — ABNORMAL HIGH (ref 0–53)
AST: 91 U/L — ABNORMAL HIGH (ref 0–37)
Total Protein: 6.8 g/dL (ref 6.0–8.3)

## 2012-02-28 LAB — CBC
Hemoglobin: 13 g/dL (ref 13.0–17.0)
MCH: 30.2 pg (ref 26.0–34.0)
MCV: 92.1 fL (ref 78.0–100.0)
RBC: 4.31 MIL/uL (ref 4.22–5.81)

## 2012-02-28 LAB — RENAL FUNCTION PANEL
CO2: 23 mEq/L (ref 19–32)
Chloride: 100 mEq/L (ref 96–112)
GFR calc Af Amer: 12 mL/min — ABNORMAL LOW (ref >90)
Glucose, Bld: 108 mg/dL — ABNORMAL HIGH (ref 70–99)
Potassium: 3.8 mEq/L (ref 3.5–5.1)
Sodium: 136 mEq/L (ref 135–145)

## 2012-02-28 LAB — MAGNESIUM: Magnesium: 1.5 mg/dL (ref 1.5–2.5)

## 2012-02-28 MED ORDER — HEPARIN SODIUM (PORCINE) 5000 UNIT/ML IJ SOLN
5000.0000 [IU] | Freq: Three times a day (TID) | INTRAMUSCULAR | Status: DC
  Administered 2012-02-28 – 2012-02-29 (×3): 5000 [IU] via SUBCUTANEOUS
  Filled 2012-02-28 (×5): qty 1

## 2012-02-28 NOTE — Progress Notes (Signed)
[facility]  Subjective:  No new complaints. Great UOP and creatinine continuing to improve, from 7's yest to 5's today.  BP has still been a little high   Objective: Vital signs in last 24 hours: Blood pressure 164/97, pulse 105, temperature 97.1 F (36.2 C), temperature source Oral, resp. rate 20, height 6' (1.829 m), weight 119.5 kg (263 lb 7.2 oz), SpO2 94.00%.    PHYSICAL EXAM Gen: NAD, laying in bed  Chest: CTA b/l Cardio: regular rate and rhythm, S1, S2 normal, no murmur GI: soft, non-tender; bowel sounds normal; distended  Extremities: positive edema Neurologic: No focal deficit  Lab Results:   Lab 02/28/12 0732 02/27/12 0500 02/26/12 0600  NA 136 137 140  K 3.8 4.4 3.9  CL 100 98 96  CO2 23 27 30   BUN 58* 69* 76*  CREATININE 5.42* 7.25*7.37* 8.64*  ALB -- -- --  GLUCOSE 108* -- --  CALCIUM 8.5 8.3* 7.7*  PHOS 5.8* 7.5*7.4* 8.3*     Basename 02/28/12 0732 02/27/12 0500  WBC 6.8 6.9  HGB 13.0 13.2  HCT 39.7 39.5  PLT 266 247     Scheduled Meds:    . aspirin  325 mg Oral Daily  . calcium carbonate  400 mg of elemental calcium Oral TID WC  . carvedilol  6.25 mg Oral BID WC  . folic acid  1 mg Oral Daily  . furosemide  40 mg Intravenous BID  . isosorbide-hydrALAZINE  1 tablet Oral TID  . magnesium oxide  200 mg Oral BID  . multivitamin with minerals  1 tablet Oral Daily  . thiamine  100 mg Oral Daily  . traZODone  100 mg Oral QHS   Continuous Infusions:    . sodium chloride 175 mL/hr at 02/28/12 0525   PRN Meds:.acetaminophen, ondansetron (ZOFRAN) IV, oxyCODONE  Assessment/Plan: Pt is a 57 y.o. 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 Acute renal failure likely multifactorial from ATN from  hypotension, ACEI and NSAID use along with rhabdomyolysis kidney injury, never required HD. Creatinine trending down last few days, now in the 5s.  great UOP on low dose lasix.  Would like to continue low dose lasix because would like to keep him a little more out than in given high BP, but continue IVF at decreased rate, CK cont to decrease.  Other work up is negative.  Is OK to d/c foley  # Rhabdomyolisis: CK trending down.  - hypocalcemia and hyperphosphatemia improving with improved renal function. continue tums.  - continue IV hydration   # Anion gap acidosis: resolved. # Hypotension: resolved. Now hypertensive. On carvedilol and imdur.  Think is a little bit overloaded, see #AFRF # CVA and altered mental status: followed by neurology. AMS improved.  # Transaminitis: GI consulted. Known hepatitis C. Hep A and B negative. Tylenol<15. Ethanol:105. LFTs improving.  # UTI: urine culture: no growth. Ceftriaxone d/ced # Schizophrenia: per primary team  # Alcohol abuse: on CIWA with thiamine, folic acid and multivit.  # PFO: not currently anticoagulated given low platelet count  Renal will Sign off- I would continue IVF at 75 and lasix 40 IV BID another 2 days, then stop IVF and change lasix to 40 BID PO while inhouse to  stop it at the time of discharge.  I anticipate that renal function will continue to improve and he will eventually mobilize all this fluid as kidneys get better.  He should not need nephrology specific follow up.  Call with any questions.     LOS: 9 days      Malaya Cagley A 02/28/2012,9:49 AM

## 2012-02-28 NOTE — Progress Notes (Signed)
Subjective: Patient reports intermittent headaches for past 2 days, and he noticed intermittent left eye double vision since yesterday.  Additionally he is noted to be fluid overload with weight gain of 27 pounds since admission.  Denies shortness breath, chest pain, chest pressure.  Objective: Vital signs in last 24 hours: Filed Vitals:   02/28/12 0200 02/28/12 0600 02/28/12 1024 02/28/12 1406  BP: 152/87 164/97 157/81 137/78  Pulse: 78 105 64 80  Temp: 98.2 F (36.8 C) 97.1 F (36.2 C) 98.3 F (36.8 C) 98.1 F (36.7 C)  TempSrc:   Oral Oral  Resp: 20 20 18    Height:      Weight:      SpO2: 98% 94% 99% 98%   Weight change:   Intake/Output Summary (Last 24 hours) at 02/28/12 1830 Last data filed at 02/28/12 1645  Gross per 24 hour  Intake   1140 ml  Output   5080 ml  Net  -3940 ml  General:  HEENT: R pupil irregular shape (prior cataract surgery), MMM of OP, EOMI  Cardiac: RRR, no rubs, murmurs or gallops  Pulm: minimal bibasilar crackles, no wheezing  Abd: non tender to palpation, normoactive BS, soft  Ext: warm and well perfused, minimal pitting edema b/l lower extremities  Neuro: AAOX3,CN2-12 intact Sensation intact face, b/l upper and lower extremities and strength 5/5 face bilateral upper and lower extremities  Extremities: 3+ Pitting edema   Lab Results: Basic Metabolic Panel:  Lab [id number] 0732 02/27/12 0500  Manahil Vanzile 136 137  K 3.8 4.4  CL 100 98  CO2 23 27  GLUCOSE 108* 114*  BUN 58* 69*  CREATININE 5.42* 7.25*7.37*  CALCIUM 8.5 8.3*  MG 1.5 1.5  PHOS 5.8* 7.5*7.4*   Liver Function Tests:  Lab 02/28/12 0732 02/27/12 0500  AST 91* 114*  ALT 182* 235*  ALKPHOS 98 102  BILITOT 0.5 0.5  PROT 6.8 6.8  ALBUMIN 3.0*3.1* 3.0*2.9*   CBC:  Lab 02/28/12 0732 02/27/12 0500 02/26/12 0600  WBC 6.8 6.9 --  NEUTROABS -- 4.1 4.1  HGB 13.0 13.2 --  HCT 39.7 39.5 --  MCV 92.1 91.9 --  PLT 266 247 --   Cardiac Enzymes:  Lab 02/28/12 0732 02/27/12 0500  02/26/12 0600 02/25/12 0500 02/24/12 0445 02/23/12 1849  CKTOTAL 4705* 7789* 12802* -- -- --  CKMB -- -- -- 11.2* 18.4* 23.1*  CKMBINDEX -- -- -- -- -- --  TROPONINI -- -- -- <0.30 0.50* 0.64*   D-Dimer:  Lab 02/22/12 1425  DDIMER 7.68*   CBG:  Lab 02/28/12 1647 02/28/12 1214 02/28/12 0656 02/27/12 2150 02/27/12 1700 02/27/12 1201  GLUCAP 112* 93 123* 119* 134* 121*   Thyroid Function Tests:  Lab 02/22/12 1141  TSH 3.160  T4TOTAL --  FREET4 --  T3FREE --  THYROIDAB --   Coagulation:  Lab 02/22/12 1425 02/22/12 0455  LABPROT 14.6 14.6  INR 1.16 1.16   Urine Drug Screen: Drugs of Abuse     Component Value Date/Time   LABOPIA NONE DETECTED 02/20/2012 0105   COCAINSCRNUR NONE DETECTED 02/20/2012 0105   LABBENZ NONE DETECTED 02/20/2012 0105   AMPHETMU NONE DETECTED 02/20/2012 0105   THCU NONE DETECTED 02/20/2012 0105   LABBARB NONE DETECTED 02/20/2012 0105     Micro Results: Recent Results (from the past 240 hour(s))  URINE CULTURE     Status: Normal   Collection Time   02/21/12 10:15 AM      Component Value Range Status Comment   Specimen  Description URINE, CATHETERIZED   Final    Special Requests NONE   Final    Culture  Setup Time 02/21/2012 21:40   Final    Colony Count NO GROWTH   Final    Culture NO GROWTH   Final    Report Status 02/22/2012 FINAL   Final   MRSA PCR SCREENING     Status: Normal   Collection Time   02/21/12 12:58 PM      Component Value Range Status Comment   MRSA by PCR NEGATIVE  NEGATIVE Final    Studies/Results: Ct Head Wo Contrast  02/28/2012  *RADIOLOGY REPORT*  Clinical Data: Left eye diplopia and headache, evaluate for hemorrhage  CT HEAD WITHOUT CONTRAST  Technique:  Contiguous axial images were obtained from the base of the skull through the vertex without contrast.  Comparison: MRI brain dated 02/20/2012  Findings: No evidence of parenchymal hemorrhage or extra-axial fluid collection. No mass lesion, mass effect, or midline shift.  No  CT evidence of acute infarction.  Specifically, the foci of restricted diffusion noted on recent MRI are not visualized on CT.  Subcortical white matter hypodensities, predominantly in the bilateral frontal lobes, left greater than right (series 2/image 20).  This appearance likely reflects small vessel ischemic changes.  Mild cortical atrophy.  No ventriculomegaly.  Mucosal thickening in the bilateral ethmoid and maxillary sinuses. The mastoid air cells are clear.  No evidence of calvarial fracture.  IMPRESSION: No evidence of intracranial hemorrhage.  Foci of restricted effusion noted on recent MRI are not visualized on CT.  Atrophy with small vessel ischemic changes.   Original Report Authenticated By: Julian Hy, M.D.    Medications: I have reviewed the patient's current medications. Scheduled Meds:   . aspirin  325 mg Oral Daily  . calcium carbonate  400 mg of elemental calcium Oral TID WC  . carvedilol  6.25 mg Oral BID WC  . folic acid  1 mg Oral Daily  . furosemide  40 mg Intravenous BID  . heparin subcutaneous  5,000 Units Subcutaneous Q8H  . isosorbide-hydrALAZINE  1 tablet Oral TID  . magnesium oxide  200 mg Oral BID  . multivitamin with minerals  1 tablet Oral Daily  . thiamine  100 mg Oral Daily  . traZODone  100 mg Oral QHS   Continuous Infusions:   . sodium chloride 75 mL/hr at 02/28/12 0953   PRN Meds:.acetaminophen, ondansetron (ZOFRAN) IV, oxyCODONE Assessment/Plan:  Mr. Abraha is 57 y.o. male with a pmh of schizophrenia, HTN, Hep C, h/o polysubstance abuse (cocaine/EtOH) admitted with confusion and found to have multiple cerebellar infarcts, rhabdomyolysis, and acute renal failure and transaminitis.   1) Multiple acute cerebellar infarcts and chronic ischemic changes  Suspect embolic source due to distribution, supported by TEE this admission which showed PFO and R-->L shunt. LE dopplers negative for DVT  - Continue ASA 325mg  for secondary stroke prophylaxis  -  A1C 5.8 --> nondiabetic  - LDL 121--> statin therapy contraindicated w transaminitis and rhabdo  - PT/OT: recommend CIR, pt evaluated and accepted by rehab MD for transfer when medically stable. Expect Monday 2/3.   2) Acute renal failure--Baseline Cr is around 1.2-1.3. Cr on admission 2.4 and peaked at 9.5 on 1/29, now downtrending  UA on admission notable for granular and RBC casts. Suspect pigment-induced renal failure in setting of rhabdo w CK >50,000. May also be ATN from hypotension, ACEi/NSAIDS vs AIN vs glomerulonephritis. UOP continues to improve. net 7L negative with  decreased lasix 40 IV BID and NS 75cc/hr.  No urgent indication for HD now.   - Renal following  - OFF Bicarb drip since 02/24/12. Lasix decreased to 40 BID  - Negative ANA, ANCA, GBM, C3, C4, SPEP, cryoglobulin none detected  3) fluid overload  Likely associated with hydration for treatment of rhabdomyolysis and chronic CHF.  No signs of acute CHF.   - Will monitor for signs and symptoms of respiratory distress, pulmonary edema in a setting of chronic CHF with EF of 25-30%. - Will likely keep patient monitor closely before discharge due to above reason  -3) Transaminitis--preserved synthetic function of liver. Acute hepatitis and HIV negative. Chronic Hep C. Negative tylenol level. Negative ANA. Abdominal ultrasound demonstrates dense hepatic architecture suspicious for cirrhosis, no evidence of infarction. CBD also enlarged at 11 without clear etiology, no gall stones.  Suspect multifactorial : rhabdo, chronic + binge EtOH use, and shock liver in setting of hypotension. Transaminases are downtrending.  - GI following.  - Trend LFTs. Much improvement.  - Will need nonurgent MRI abd+MRCP, per GI  Lab Results   Component  Value  Date    ALT  235*  02/27/2012    AST  114*  02/27/2012    ALKPHOS  102  02/27/2012    BILITOT  0.5  02/27/2012    4) Rhabdomyolysis  Fell on left hip prior to admission. On initial testing,  patient's CK above upper limits normal >50,000. Etiology not clear. No prolonged immobilization. UDS negative for cocaine, although has h/o use. Not on statin therapy, has been on fenofibrate for hyperTG, but rhabdo would be rare SE. Is chronic drinker with frequent binging and EtOH level 105 on admission. Denies any symptoms of antecedent viral illness prior to admission on our questioning, although Dr. Barkley Bruns note documents endorsement of 1 week N/V/D prior to admission.  Was managed aggressively w IVF boluses, bicarb drip and lasix QID. Bicarb drip discontinued 02/24/12, on lasix BID and NS at 75cc/hr. No hyperkalemia. CK continues to downtrend. - Renal following, appreciate continued input  - Decreased Lasix to 40mg  BID  - NS 75 cc/hr  5) Thrombocytopenia  Resolved. Platelets decreased from 168 on admission to 78 on 02/23/12. Platelets 247 02/27/12.  - Heparin subcutaneous added for DVT  6) Anion Gap Metabolic Acidosis , resolved AG 14-->12 today. Likely in setting of renal failure.  - Trend bmet  7) Congestive heart failure w EF 30-35%  Noted to have poor EF on echo, no previous dx CHF. Presently well compensated congestive heart failure. Not a candidate for ACEi or ARB due to ARF. Seen by cardiology, Dr. Einar Gip.  - Coreg BID and Bidil TID   8) ? UTI  Trace leuks and cloudy urine. Had leukocytosis on admission which has resolved. No organism on culture. Received abx therapy with ceftriaxone for several days, d/c 1/27.  9) EtOH abuse  On CIWA  10) Hypertension  Had on episode of hypotension during admission but now hypertensive. Not a candidate for ACEi or ARB due to ARF.  - bidil TID and increased dose of coreg to 6.25mg  BID   Diet: Regular  DVT Ppx: Heparin Dispo: CIR accepted, when medically stable (anticipate 02/29/12)  The patient does have a current PCP ([facility]), therefore will be requiring OPC follow-up after discharge.  The patient does not have transportation limitations that  hinder transportation to clinic appointments.     .Services Needed at time of discharge: Y = Yes, Blank = No  PT:   OT:   RN:   Equipment:   Other:     LOS: 9 days   Steadman Prosperi 02/28/2012, 6:30 PM

## 2012-02-29 ENCOUNTER — Inpatient Hospital Stay (HOSPITAL_COMMUNITY)
Admission: RE | Admit: 2012-02-29 | Discharge: 2012-03-08 | DRG: 945 | Disposition: A | Discharge status: Home / Self Care | Payer: PRIVATE HEALTH INSURANCE | Source: Intra-hospital | Attending: Physical Medicine & Rehabilitation | Admitting: Physical Medicine & Rehabilitation

## 2012-02-29 ENCOUNTER — Inpatient Hospital Stay (HOSPITAL_COMMUNITY): Payer: PRIVATE HEALTH INSURANCE

## 2012-02-29 ENCOUNTER — Encounter (HOSPITAL_COMMUNITY): Payer: Self-pay

## 2012-02-29 DIAGNOSIS — D696 Thrombocytopenia, unspecified: Secondary | ICD-10-CM

## 2012-02-29 DIAGNOSIS — N170 Acute kidney failure with tubular necrosis: Secondary | ICD-10-CM

## 2012-02-29 DIAGNOSIS — R4789 Other speech disturbances: Secondary | ICD-10-CM

## 2012-02-29 DIAGNOSIS — I426 Alcoholic cardiomyopathy: Secondary | ICD-10-CM

## 2012-02-29 DIAGNOSIS — R51 Headache: Secondary | ICD-10-CM

## 2012-02-29 DIAGNOSIS — Z7982 Long term (current) use of aspirin: Secondary | ICD-10-CM

## 2012-02-29 DIAGNOSIS — F209 Schizophrenia, unspecified: Secondary | ICD-10-CM

## 2012-02-29 DIAGNOSIS — I6340 Cerebral infarction due to embolism of unspecified cerebral artery: Secondary | ICD-10-CM

## 2012-02-29 DIAGNOSIS — E872 Acidosis: Secondary | ICD-10-CM

## 2012-02-29 DIAGNOSIS — B1920 Unspecified viral hepatitis C without hepatic coma: Secondary | ICD-10-CM

## 2012-02-29 DIAGNOSIS — I10 Essential (primary) hypertension: Secondary | ICD-10-CM

## 2012-02-29 DIAGNOSIS — F1410 Cocaine abuse, uncomplicated: Secondary | ICD-10-CM

## 2012-02-29 DIAGNOSIS — F1010 Alcohol abuse, uncomplicated: Secondary | ICD-10-CM

## 2012-02-29 DIAGNOSIS — Z5189 Encounter for other specified aftercare: Principal | ICD-10-CM

## 2012-02-29 DIAGNOSIS — I5042 Chronic combined systolic (congestive) and diastolic (congestive) heart failure: Secondary | ICD-10-CM

## 2012-02-29 DIAGNOSIS — Z79899 Other long term (current) drug therapy: Secondary | ICD-10-CM

## 2012-02-29 DIAGNOSIS — Z8249 Family history of ischemic heart disease and other diseases of the circulatory system: Secondary | ICD-10-CM

## 2012-02-29 DIAGNOSIS — M6282 Rhabdomyolysis: Secondary | ICD-10-CM

## 2012-02-29 DIAGNOSIS — H532 Diplopia: Secondary | ICD-10-CM

## 2012-02-29 DIAGNOSIS — I639 Cerebral infarction, unspecified: Secondary | ICD-10-CM

## 2012-02-29 HISTORY — DX: Cerebral infarction, unspecified: I63.9

## 2012-02-29 LAB — GLUCOSE, CAPILLARY
Glucose-Capillary: 118 mg/dL — ABNORMAL HIGH (ref 70–99)
Glucose-Capillary: 96 mg/dL (ref 70–99)

## 2012-02-29 LAB — CBC
Hemoglobin: 12.6 g/dL — ABNORMAL LOW (ref 13.0–17.0)
Hemoglobin: 11.8 g/dL — ABNORMAL LOW (ref 13.0–17.0)
MCH: 30.4 pg (ref 26.0–34.0)
MCHC: 32.8 g/dL (ref 30.0–36.0)
MCHC: 32.8 g/dL (ref 30.0–36.0)
Platelets: 279 10*3/uL (ref 150–400)
RDW: 14.5 % (ref 11.5–15.5)

## 2012-02-29 LAB — HEPATIC FUNCTION PANEL
AST: 72 U/L — ABNORMAL HIGH (ref 0–37)
Albumin: 3.1 g/dL — ABNORMAL LOW (ref 3.5–5.2)
Total Bilirubin: 0.5 mg/dL (ref 0.3–1.2)
Total Protein: 6.8 g/dL (ref 6.0–8.3)

## 2012-02-29 LAB — MAGNESIUM: Magnesium: 1.6 mg/dL (ref 1.5–2.5)

## 2012-02-29 LAB — CK: Total CK: 3673 U/L — ABNORMAL HIGH (ref 7–232)

## 2012-02-29 LAB — RENAL FUNCTION PANEL
CO2: 21 mEq/L (ref 19–32)
Chloride: 102 mEq/L (ref 96–112)
Creatinine, Ser: 4.37 mg/dL — ABNORMAL HIGH (ref 0.50–1.35)
GFR calc non Af Amer: 14 mL/min — ABNORMAL LOW (ref >90)
Potassium: 3.9 mEq/L (ref 3.5–5.1)

## 2012-02-29 LAB — CREATININE, SERUM
Creatinine, Ser: 3.79 mg/dL — ABNORMAL HIGH (ref 0.50–1.35)
GFR calc non Af Amer: 16 mL/min — ABNORMAL LOW (ref >90)

## 2012-02-29 MED ORDER — CARVEDILOL 6.25 MG PO TABS: 6.2500 mg | ORAL_TABLET | Freq: Two times a day (BID) | ORAL | Status: DC

## 2012-02-29 MED ORDER — MAGNESIUM OXIDE 400 (241.3 MG) MG PO TABS
200.0000 mg | ORAL_TABLET | Freq: Two times a day (BID) | ORAL | Status: DC
  Administered 2012-02-29 – 2012-03-06 (×12): 200 mg via ORAL
  Administered 2012-03-06: 08:00:00 via ORAL
  Administered 2012-03-07 – 2012-03-08 (×3): 200 mg via ORAL
  Filled 2012-02-29 (×19): qty 0.5

## 2012-02-29 MED ORDER — FUROSEMIDE 10 MG/ML IJ SOLN: 40.0000 mg | Freq: Two times a day (BID) | INTRAMUSCULAR | Status: DC

## 2012-02-29 MED ORDER — CALCIUM CARBONATE ANTACID 500 MG PO CHEW
400.0000 mg | CHEWABLE_TABLET | Freq: Three times a day (TID) | ORAL | Status: DC
  Administered 2012-03-01 (×2): 400 mg via ORAL
  Filled 2012-02-29 (×5): qty 2

## 2012-02-29 MED ORDER — HEPARIN SODIUM (PORCINE) 5000 UNIT/ML IJ SOLN: 5000.0000 [IU] | Freq: Three times a day (TID) | INTRAMUSCULAR | Status: DC

## 2012-02-29 MED ORDER — POLYETHYLENE GLYCOL 3350 17 G PO PACK
17.0000 g | PACK | Freq: Every day | ORAL | Status: DC
  Administered 2012-02-29: 17 g via ORAL
  Filled 2012-02-29: qty 1

## 2012-02-29 MED ORDER — POLYETHYLENE GLYCOL 3350 17 G PO PACK: 17.0000 g | PACK | Freq: Every day | ORAL | Status: DC

## 2012-02-29 MED ORDER — CARVEDILOL 6.25 MG PO TABS
6.2500 mg | ORAL_TABLET | Freq: Two times a day (BID) | ORAL | Status: DC
  Administered 2012-03-01 – 2012-03-08 (×15): 6.25 mg via ORAL
  Filled 2012-02-29 (×18): qty 1

## 2012-02-29 MED ORDER — SORBITOL 70 % SOLN
30.0000 mL | Freq: Every day | Status: DC | PRN
  Administered 2012-03-01: 30 mL via ORAL
  Filled 2012-02-29: qty 30

## 2012-02-29 MED ORDER — FOLIC ACID 1 MG PO TABS: 1.0000 mg | ORAL_TABLET | Freq: Every day | ORAL | Status: DC

## 2012-02-29 MED ORDER — ISOSORB DINITRATE-HYDRALAZINE 20-37.5 MG PO TABS: 1.0000 | ORAL_TABLET | Freq: Three times a day (TID) | ORAL | Status: DC

## 2012-02-29 MED ORDER — MAGNESIUM OXIDE 400 (241.3 MG) MG PO TABS: 200.0000 mg | ORAL_TABLET | Freq: Two times a day (BID) | ORAL | Status: DC

## 2012-02-29 MED ORDER — OXYCODONE HCL 10 MG PO TABS: 10.0000 mg | ORAL_TABLET | ORAL | Status: DC | PRN

## 2012-02-29 MED ORDER — ONDANSETRON HCL 4 MG PO TABS
4.0000 mg | ORAL_TABLET | Freq: Four times a day (QID) | ORAL | Status: DC | PRN
  Administered 2012-03-05: 4 mg via ORAL
  Filled 2012-02-29: qty 1

## 2012-02-29 MED ORDER — VITAMIN B-1 100 MG PO TABS
100.0000 mg | ORAL_TABLET | Freq: Every day | ORAL | Status: DC
  Administered 2012-03-01 – 2012-03-08 (×8): 100 mg via ORAL
  Filled 2012-02-29 (×9): qty 1

## 2012-02-29 MED ORDER — ISOSORB DINITRATE-HYDRALAZINE 20-37.5 MG PO TABS
1.0000 | ORAL_TABLET | Freq: Three times a day (TID) | ORAL | Status: DC
  Administered 2012-03-01 – 2012-03-08 (×22): 1 via ORAL
  Filled 2012-02-29 (×25): qty 1

## 2012-02-29 MED ORDER — ASPIRIN 325 MG PO TABS
325.0000 mg | ORAL_TABLET | Freq: Every day | ORAL | Status: DC
  Administered 2012-03-01 – 2012-03-08 (×8): 325 mg via ORAL
  Filled 2012-02-29 (×9): qty 1

## 2012-02-29 MED ORDER — HEPARIN SODIUM (PORCINE) 5000 UNIT/ML IJ SOLN
5000.0000 [IU] | Freq: Three times a day (TID) | INTRAMUSCULAR | Status: DC
  Administered 2012-02-29 – 2012-03-07 (×22): 5000 [IU] via SUBCUTANEOUS
  Filled 2012-02-29 (×26): qty 1

## 2012-02-29 MED ORDER — FOLIC ACID 1 MG PO TABS
1.0000 mg | ORAL_TABLET | Freq: Every day | ORAL | Status: DC
  Administered 2012-03-01 – 2012-03-08 (×8): 1 mg via ORAL
  Filled 2012-02-29 (×9): qty 1

## 2012-02-29 MED ORDER — ASPIRIN 325 MG PO TABS: 325.0000 mg | ORAL_TABLET | Freq: Every day | ORAL | Status: DC

## 2012-02-29 MED ORDER — ADULT MULTIVITAMIN W/MINERALS CH
1.0000 | ORAL_TABLET | Freq: Every day | ORAL | Status: DC
  Administered 2012-03-01 – 2012-03-08 (×8): 1 via ORAL
  Filled 2012-02-29 (×9): qty 1

## 2012-02-29 MED ORDER — ONDANSETRON HCL 4 MG/2ML IJ SOLN: 4.0000 mg | Freq: Four times a day (QID) | INTRAMUSCULAR | Status: DC | PRN

## 2012-02-29 MED ORDER — SODIUM CHLORIDE 0.9 % IV SOLN: 75.0000 mL | INTRAVENOUS | Status: AC

## 2012-02-29 MED ORDER — CALCIUM CARBONATE ANTACID 500 MG PO CHEW: 400.0000 mg | CHEWABLE_TABLET | Freq: Three times a day (TID) | ORAL | Status: DC

## 2012-02-29 MED ORDER — FUROSEMIDE 40 MG PO TABS
40.0000 mg | ORAL_TABLET | Freq: Two times a day (BID) | ORAL | Status: DC
  Administered 2012-03-01 – 2012-03-08 (×15): 40 mg via ORAL
  Filled 2012-02-29 (×17): qty 1

## 2012-02-29 MED ORDER — SODIUM CHLORIDE 0.9 % IV SOLN
INTRAVENOUS | Status: DC
  Administered 2012-02-29 – 2012-03-03 (×4): via INTRAVENOUS

## 2012-02-29 MED ORDER — THIAMINE HCL 100 MG PO TABS: 100.0000 mg | ORAL_TABLET | Freq: Every day | ORAL | Status: DC

## 2012-02-29 MED ORDER — ACETAMINOPHEN 325 MG PO TABS
325.0000 mg | ORAL_TABLET | ORAL | Status: DC | PRN
  Administered 2012-03-01 – 2012-03-08 (×17): 650 mg via ORAL
  Filled 2012-02-29 (×16): qty 2

## 2012-02-29 MED ORDER — ADULT MULTIVITAMIN W/MINERALS CH: 1.0000 | ORAL_TABLET | Freq: Every day | ORAL | Status: DC

## 2012-02-29 NOTE — Progress Notes (Signed)
Physical Therapy Treatment Patient Details Name: [name] [name] MRN: [id number] DOB: October 16, 1955 Today's Date: 02/29/2012 Time: [id number] PT Time Calculation (min): 23 min  PT Assessment / Plan / Recommendation Comments on Treatment Session  Pt cont's to progress with mobility & PT goals at this date.  Reports vision in Lt eye seems to be worse today than it has been.  Tolerated ambulation & balance activities well.      Follow Up Recommendations  CIR     Does the patient have the potential to tolerate intense rehabilitation     Barriers to Discharge        Equipment Recommendations  Rolling walker with 5" wheels    Recommendations for Other Services Rehab consult  Frequency Min 4X/week   Plan Discharge plan remains appropriate;Frequency remains appropriate    Precautions / Restrictions Precautions Precautions: Fall Restrictions Weight Bearing Restrictions: No       Mobility  Bed Mobility Bed Mobility: Not assessed Transfers Transfers: Sit to Stand;Stand to Sit Sit to Stand: 4: Min guard;With upper extremity assist;With armrests;From chair/3-in-1 Stand to Sit: 4: Min guard;With upper extremity assist;With armrests;To chair/3-in-1 Details for Transfer Assistance: performed 4x's.  2x's with focus on Lt UE/LE for strengthening.    Ambulation/Gait Ambulation/Gait Assistance: 4: Min guard Ambulation Distance (Feet): 200 Feet Assistive device: Rolling walker Ambulation/Gait Assistance Details: Cues for body positioning inside RW, tall posture, & encouragement for decreased reliance of UE's on RW.   Gait Pattern: Step-through pattern;Decreased hip/knee flexion - right;Decreased stance time - left;Decreased step length - right;Decreased weight shift to left;Trunk flexed;Wide base of support;Lateral trunk lean to left (decreased floor clearance) Gait velocity: decreased Stairs: No Wheelchair Mobility Wheelchair Mobility: No Modified Rankin (Stroke Patients Only) Pre-Morbid  Rankin Score: No symptoms Modified Rankin: Moderately severe disability    Exercises General Exercises - Lower Extremity Long Arc Quad: Both;10 reps Hip Flexion/Marching: AROM;Strengthening;10 reps;Both;Seated;Standing Toe Raises: AROM;Strengthening;Both;10 reps Heel Raises: AROM;Strengthening;Both;10 reps   PT Goals Acute Rehab PT Goals Time For Goal Achievement: 03/02/12 Potential to Achieve Goals: Good Pt will go Supine/Side to Sit: with supervision Pt will go Sit to Supine/Side: with min assist Pt will go Sit to Stand: with supervision PT Goal: Sit to Stand - Progress: Progressing toward goal Pt will go Stand to Sit: with supervision PT Goal: Stand to Sit - Progress: Progressing toward goal Pt will Stand: with supervision;3 - 5 min;with unilateral upper extremity support PT Goal: Stand - Progress: Progressing toward goal Pt will Ambulate: >150 feet;with least restrictive assistive device;with supervision PT Goal: Ambulate - Progress: Progressing toward goal  Visit Information  Last PT Received On: 02/29/12 Assistance Needed: +1    Subjective Data  Patient Stated Goal: To get stronger   Cognition  Cognition Overall Cognitive Status: Appears within functional limits for tasks assessed/performed Arousal/Alertness: Awake/alert Orientation Level: Appears intact for tasks assessed Behavior During Session: [facility] for tasks performed    Balance  Static Standing Balance Static Standing - Balance Support: No upper extremity supported;Right upper extremity supported;Left upper extremity supported Static Standing - Level of Assistance: 5: Stand by assistance;4: Min assist Static Standing - Comment/# of Minutes: Pt required min (A) for stability with tandem stance, Single leg stance.  Min Guard for standing with eyes open/closed, feet togther/apart, UE's straight out in front, out to side, above head.  Perturbations at hips + shoulders in all directions.   Dynamic Standing  Balance Dynamic Standing - Balance Support: Right upper extremity supported;Left upper extremity supported Dynamic Standing - Level  of Assistance: 5: Stand by assistance Dynamic Standing - Balance Activities: Lateral lean/weight shifting;Forward lean/weight shifting;Reaching for objects;Reaching across midline (Min (A) for marching in place with no UE support)  End of Session PT - End of Session Equipment Utilized During Treatment: Gait belt Activity Tolerance: Patient tolerated treatment well Patient left: in chair;with call bell/phone within reach Nurse Communication: Mobility status     Sarajane Marek, [place] 02/29/2012

## 2012-02-29 NOTE — Discharge Summary (Signed)
Internal [facility] Discharge Note  Name: [name] [name] MRN: [id number] DOB: [birth year] 57 y.o.  Date of Admission: 02/19/2012 11:02 PM Date of Discharge: 02/29/2012 Attending Physician: Truman Hayward, MD  Discharge Diagnosis: Principal Problem:  *Cerebellar infarct Active Problems:  Altered mental status  Schizophrenia  HTN (hypertension)  TIA (transient ischemic attack)  Acute renal failure (ARF)  Transaminitis  Rhabdomyolysis  Discharge Medications:   Medication List     As of 02/29/2012  4:56 PM    STOP taking these medications         cloNIDine 0.3 MG tablet   Commonly known as: CATAPRES      furosemide 20 MG tablet   Commonly known as: LASIX      labetalol 100 MG tablet   Commonly known as: NORMODYNE      lisinopril 20 MG tablet   Commonly known as: PRINIVIL,ZESTRIL      TAKE these medications         aspirin 325 MG tablet   Take 1 tablet (325 mg total) by mouth daily.      calcium carbonate 500 MG chewable tablet   Commonly known as: TUMS - dosed in mg elemental calcium   Chew 1 tablet (500 mg total) by mouth 3 (three) times daily with meals.      carvedilol 6.25 MG tablet   Commonly known as: COREG   Take 1 tablet (6.25 mg total) by mouth 2 (two) times daily with a meal.      folic acid 1 MG tablet   Commonly known as: FOLVITE   Take 1 tablet (1 mg total) by mouth daily.      furosemide 10 MG/ML injection   Commonly known as: LASIX   Inject 4 mLs (40 mg total) into the vein 2 (two) times daily.      heparin 5000 UNIT/ML injection   Inject 1 mL (5,000 Units total) into the skin every 8 (eight) hours.      isosorbide-hydrALAZINE 20-37.5 MG per tablet   Commonly known as: BIDIL   Take 1 tablet by mouth 3 (three) times daily.      magnesium oxide 400 (241.3 MG) MG tablet   Commonly known as: MAG-OX   Take 0.5 tablets (200 mg total) by mouth 2 (two) times daily.      multivitamin with minerals Tabs   Take 1 tablet  by mouth daily.      Oxycodone HCl 10 MG Tabs   Take 1 tablet (10 mg total) by mouth every 4 (four) hours as needed.      polyethylene glycol packet   Commonly known as: MIRALAX / GLYCOLAX   Take 17 g by mouth daily.      sodium chloride 0.9 % infusion   Inject 75 mLs into the vein continuous.      thiamine 100 MG tablet   Take 1 tablet (100 mg total) by mouth daily.      traZODone 100 MG tablet   Commonly known as: DESYREL   Take 100 mg by mouth at bedtime.       Disposition and follow-up:   [name] [name] was discharged from [facility] in stable condition.  At the hospital follow up visit please address: Rehab s/p CVA Rhabdomyolysis per renal recommendations with IVF and lasix ARF continue to monitor renal function with IVF Transaminitis continue to monitor LFTs CHF monitor fluid status  Follow-up Appointments:     Follow-up Information  Follow up with Forbes Cellar, MD. Schedule an appointment as soon as possible for a visit in 1 month. (ask office to schedule TCD bubble study with emboli monitoring, you will see DR. Sethi at that time)    Contact information:   [address] [place] [address] [phone]         Discharge Orders    Future Appointments: Provider: Department: Dept Phone: Center:   03/01/2012 10:00 AM Cindra Presume, [place]  [place] [phone] None   03/01/2012 11:00 AM Flint Melter, [place]  [address] INPATIENT  REHAB [phone] None   03/01/2012 1:30 PM Cindra Presume, [place]  [address] INPATIENT  REHAB [phone] None   03/01/2012 2:30 PM Flint Melter, [place]  [place] [phone] None     Future Orders Please Complete By Expires   Diet - low sodium heart healthy      Increase activity slowly        Consultations: Treatment Team:  Laverda Page,  MD  Procedures Performed:  Ct Head Wo Contrast  02/28/2012  *RADIOLOGY REPORT*  Clinical Data: Left eye diplopia and headache, evaluate for hemorrhage  CT HEAD WITHOUT CONTRAST  Technique:  Contiguous axial images were obtained from the base of the skull through the vertex without contrast.  Comparison: MRI brain dated 02/20/2012  Findings: No evidence of parenchymal hemorrhage or extra-axial fluid collection. No mass lesion, mass effect, or midline shift.  No CT evidence of acute infarction.  Specifically, the foci of restricted diffusion noted on recent MRI are not visualized on CT.  Subcortical white matter hypodensities, predominantly in the bilateral frontal lobes, left greater than right (series 2/image 20).  This appearance likely reflects small vessel ischemic changes.  Mild cortical atrophy.  No ventriculomegaly.  Mucosal thickening in the bilateral ethmoid and maxillary sinuses. The mastoid air cells are clear.  No evidence of calvarial fracture.  IMPRESSION: No evidence of intracranial hemorrhage.  Foci of restricted effusion noted on recent MRI are not visualized on CT.  Atrophy with small vessel ischemic changes.   Original Report Authenticated By: Julian Hy, M.D.    Ct Head Wo Contrast  02/19/2012  *RADIOLOGY REPORT*  Clinical Data: Code stroke.  CT HEAD WITHOUT CONTRAST  Technique:  Contiguous axial images were obtained from the base of the skull through the vertex without contrast.  Comparison: None.  Findings: There is no definite evidence of acute infarction, mass lesion, or intra- or extra-axial hemorrhage on CT.  Mild subcortical white matter hypoattenuation is noted at the high left frontoparietal region; this may reflect small vessel ischemic microangiopathy, or possibly an infarct of indeterminate age.  The posterior fossa, including the cerebellum, brainstem and fourth ventricle, is within normal limits.  The third and lateral ventricles, and basal ganglia are unremarkable in  appearance.  No mass effect or midline shift is seen.  There is no evidence of fracture; visualized osseous structures are unremarkable in appearance.  The orbits are within normal limits. The paranasal sinuses and mastoid air cells are well-aerated.  No significant soft tissue abnormalities are seen.  IMPRESSION:  1.  Mild subcortical white matter change at the high left frontoparietal region; this may reflect small vessel ischemic microangiopathy, or possibly a small infarct of indeterminate age. 2.  Otherwise unremarkable noncontrast CT of the head.  These results were called by  telephone on 02/19/2012 at 11:18 p.m. to Dr. Wallie Char, who verbally acknowledged these results.   Original Report Authenticated By: Santa Lighter, M.D.    Mri Brain Without Contrast  02/20/2012  *RADIOLOGY REPORT*  Clinical Data: 57 year old male with altered mental status. Confusion and slurred speech.  MRI HEAD WITHOUT CONTRAST  Technique:  Multiplanar, multiecho pulse sequences of the brain and surrounding structures were obtained according to standard protocol without intravenous contrast.  Comparison: Head CT without contrast 02/19/2012.  Findings: 10 mm focus of restricted diffusion in the right cerebellum (series 3 image 5).  Smaller more linear focus of restricted effusion in the left cerebellar hemisphere (series 5 image 10).  Questionable asymmetric punctate focus in the lower brain stem on the left (series 5 image 13).  There is also evidence of a punctate focus restricted diffusion in the left external capsule anterior limb (series 5 image 28).  There might also be a nearby focus in the left caudate nucleus (series 5 image 30).  No other supratentorial diffusion abnormality.  Only subtle associated T2 hyperintensity at the larger cerebellar lesion.  No hemorrhage or mass effect. Major intracranial vascular flow voids are preserved.  Negative pituitary, cervicomedullary junction visualized cervical spine.  Normal bone  marrow signal. Scattered subcortical cerebral white matter T2 and FLAIR hyperintensity not associated with the acute findings is nonspecific and mild to moderate for age.  There are increased dilated perivascular spaces in the cerebral hemispheres.  No ventriculomegaly.  Postoperative changes to the right globe. Visualized paranasal sinuses and mastoids are clear.   Negative scalp soft tissues.  IMPRESSION: 1.  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  No mass effect or hemorrhage.  Possible associated acute punctate left brain stem infarct. 2.  Questionable acute left external capsule and caudate nucleus punctate lacunar infarct. This could reflect synchronous small vessel ischemia, less likely sequelae of embolic phenomena given the different vascular territories.   Original Report Authenticated By: Roselyn Reef, M.D.    US Abdomen Complete  02/21/2012  *RADIOLOGY REPORT*  Clinical Data:  57 year old male with elevated LFTs.  History of hepatitis C.  ABDOMINAL ULTRASOUND COMPLETE  Comparison:  None  Findings:  Gallbladder:  The gallbladder is unremarkable. There is no evidence of gallstones, gallbladder wall thickening, or pericholecystic fluid.  Common Bile Duct: There is no evidence of intrahepatic biliary dilatation. The CBD is dilated measuring 11 mm in greatest diameter.  No definite CBD filling defects are noted.  Liver: Diffuse increased echogenicity and heterogeneity of the liver is noted. There may be minimal nodularity of the hepatic contour suggestive of cirrhosis.  No definite focal hepatic lesions are identified.  IVC:  Appears normal.  Pancreas:  Although the pancreas is difficult to visualize in its entirety, no focal pancreatic abnormality is identified.  Spleen:  Within normal limits in size and echotexture.  Cysts within the spleen are noted.  Right kidney:  The right kidney is normal in size and parenchymal echogenicity.  There is no evidence of solid mass, hydronephrosis or  definite renal calculi.  The right kidney measures 13.4 cm.  Left kidney:  The left kidney is normal in size and parenchymal echogenicity.  There is no evidence of solid mass, hydronephrosis or definite renal calculi.   The left kidney measures 13.2 cm.  Abdominal Aorta:  No abdominal aortic aneurysm identified.  The entire abdominal aorta is not well visualized secondary to overlying bowel gas.  There is no evidence of ascites.  IMPRESSION:  Dense slightly heterogeneous liver with morphologic suggestive of cirrhosis.  Mild CBD dilatation - 11 mm.  Consider further evaluation.   Original Report Authenticated By: Margarette Canada, M.D.    Dg Pelvis Portable  02/19/2012  *RADIOLOGY REPORT*  Clinical Data: Status post fall; concern for pelvic injury.  PORTABLE PELVIS  Comparison: None.  Findings: There is no evidence of fracture or dislocation.  Both femoral heads are seated normally within their respective acetabula.  Lumbar spinal fusion hardware is partially imaged.  The sacroiliac joints are unremarkable in appearance.  The visualized bowel gas pattern is grossly unremarkable in appearance.  IMPRESSION: No evidence of fracture or dislocation.   Original Report Authenticated By: Santa Lighter, M.D.    [facility] 1 View  02/29/2012  *RADIOLOGY REPORT*  Clinical Data: Possible edema  PORTABLE CHEST - 1 VIEW  Comparison: Portable chest x-ray of 02/18/2010  Findings: The lungs are not optimally aerated but no focal infiltrate or effusion is seen.  Cardiomegaly is stable.  No skeletal abnormality is seen.  IMPRESSION: Stable cardiomegaly.  No active lung disease.   Original Report Authenticated By: Ivar Drape, M.D.    Dg Chest Portable 1 View  02/19/2012  *RADIOLOGY REPORT*  Clinical Data: Code stroke.  Fall. Shortness of breath.  Weakness.  PORTABLE CHEST - 1 VIEW  Comparison: None.  Findings:  Cardiomegaly.  Prominence of the mediastinum may reflect portable technique and magnification although underlying mass or  tortuous aorta not excluded.  Central pulmonary vascular prominence without frank pulmonary edema.  Left base subsegmental atelectasis.  No obvious fracture or gross pneumothorax.  The patient would eventually benefit from follow-up two-view chest with cardiac leads removed.  IMPRESSION: Cardiomegaly.  Prominence of the mediastinum may reflect portable technique and magnification although underlying mass or tortuous aorta not excluded.  Central pulmonary vascular prominence without frank pulmonary edema.  Left base subsegmental atelectasis.   Original Report Authenticated By: Genia Del, M.D.    2D Echo:  Transthoracic Echocardiography  Patient: Knowledge, Baquero MR #: [id number] Study Date: 02/20/2012 Gender: M Age: 45 Height: 182.9cm Weight: 112kg BSA: 2.49m^2 Pt. Status: Room: 4N25C  PERFORMING [place] ORDERING Orvan Falconer SONOGRAPHER Melissa Morford, RDCS cc:  ------------------------------------------------------------ LV EF: 30% - 35%  ------------------------------------------------------------ Indications: TIA 435.9.  ------------------------------------------------------------ History: PMH: Schizophrenia. Hep C. Polysubstance Abuse. Risk factors: Hypertension.  ------------------------------------------------------------ Study Conclusions  Left ventricle: Difficult to visualize. The cavity size was normal. There was mild concentric hypertrophy. Systolic function was moderately to severely reduced. The estimated ejection fraction was in the range of 30% to 35%. Possible hypokinesis of the midinferolateral myocardium. Doppler parameters are consistent with abnormal left ventricular relaxation (grade 1 diastolic dysfunction).  Impressions:  - No cardiac source of emboli was indentified. Recommendations: Consider transesophageal echocardiography if clinically indicated. Transthoracic echocardiography. M-mode, complete 2D, spectral Doppler, and color  Doppler. Height: Height: 182.9cm. Height: 72in. Weight: Weight: 112kg. Weight: 246.4lb. Body mass index: BMI: 33.5kg/m^2. Body surface area: BSA: 2.25m^2. Blood pressure: 140/91. Patient status: Inpatient. Location: Bedside.  ------------------------------------------------------------  ------------------------------------------------------------ Left ventricle: Difficult to visualize. The cavity size was normal. There was mild concentric hypertrophy. Systolic function was moderately to severely reduced. The estimated ejection fraction was in the range of 30% to 35%. Regional wall motion abnormalities: Possible hypokinesis of the midinferolateral myocardium. Doppler parameters are consistent with abnormal left ventricular relaxation (grade 1 diastolic dysfunction).  ------------------------------------------------------------ Aortic valve: Structurally normal valve. Cusp separation was normal. Doppler: Transvalvular velocity was within the normal range. There was no  stenosis. No regurgitation.  ------------------------------------------------------------ Aorta: The aorta was normal, not dilated, and non-diseased.  ------------------------------------------------------------ Mitral valve: Structurally normal valve. Leaflet separation was normal. Doppler: Transvalvular velocity was within the normal range. There was no evidence for stenosis. Trivial regurgitation.  ------------------------------------------------------------ Left atrium: The atrium was at the upper limits of normal in size.  ------------------------------------------------------------ Right ventricle: The cavity size was normal. Wall thickness was normal. Systolic function was normal.  ------------------------------------------------------------ Pulmonic valve: Poorly visualized. Structurally normal valve. Cusp separation was normal. Doppler: Transvalvular velocity was within the normal range.  No regurgitation.  ------------------------------------------------------------ Tricuspid valve: Structurally normal valve. Leaflet separation was normal. Doppler: Transvalvular velocity was within the normal range. Trivial regurgitation.  ------------------------------------------------------------ Pulmonary artery: The main pulmonary artery was normal-sized.  ------------------------------------------------------------ Right atrium: The atrium was normal in size.  ------------------------------------------------------------ Pericardium: The pericardium was normal in appearance. There was no pericardial effusion.  ------------------------------------------------------------ Systemic veins: Inferior vena cava: The vessel was normal in size; the respirophasic diameter changes were in the normal range (= 50%); findings are consistent with normal central venous pressure.  ------------------------------------------------------------ Post procedure conclusions Ascending Aorta:  - The aorta was normal, not dilated, and non-diseased.  ------------------------------------------------------------  2D measurements Normal Doppler Normal Left ventricle measurements LVID ED, 52.8 mm 43-52 Left ventricle chord, Ea, lat 6.69 cm/ ------- PLAX ann, tiss s LVID ES, 44.9 mm 23-38 DP chord, E/Ea, lat 8.27 ------- PLAX ann, tiss FS, chord, 15 % >29 DP PLAX Ea, med 4.83 cm/ ------- LVPW, ED 14.5 mm ------ ann, tiss s IVS/LVPW 0.95 <1.3 DP ratio, ED E/Ea, med 11.45 ------- Ventricular septum ann, tiss IVS, ED 13.8 mm ------ DP Aorta Mitral valve Root diam, 35 mm ------ Peak E vel 55.3 cm/ ------- ED s Left atrium Peak A vel 75.5 cm/ ------- AP dim 39 mm ------ s AP dim 1.61 cm/m^2 <2.2 Deceleratio 243 ms 150-230 index n time Peak E/A 0.7 ------- ratio Right ventricle Sa vel, lat 11.3 cm/ ------- ann, tiss  s DP  ------------------------------------------------------------ Prepared and Electronically Authenticated by  Candee Furbish 2014-01-25T14:26:17.190  Transesophageal Echocardiography  Patient:    Zuko, Bisset MR #:       [id number] Study Date: 02/22/2012 Gender:     M Age:        34 Height:     182.9cm Weight:     112.3kg BSA:        2.45m^2 Pt. Status: Room:       [place], RDCS  ADMITTING    Juliane Lack, [place]  REFERRING    Leonel Ramsay, [place] cc:  ------------------------------------------------------------ LV EF: 25% -   30%  ------------------------------------------------------------ Indications:      CVA 436.  ------------------------------------------------------------ Study Conclusions  - Left ventricle: There was moderate concentric hypertrophy.   Systolic function was severely reduced. The estimated   ejection fraction was in the range of 25% to 30%. Diffuse   hypokinesis. - Left atrium: The atrium was moderately dilated. - Right ventricle: Wall thickness was moderately increased.   Systolic function was moderately reduced. The estimated   peak pressure was 44mm Hg. - Right atrium: The atrium was moderately dilated. - Atrial septum: There was a medium-sized valve-incompetent   patent foramen ovale. Doppler and agitated saline contrast   study showed a moderate right-to-left shunt through a   patent foramen ovale, in the baseline  state. Impressions:  - Cardiac source of cerebral embolism cannot be excluded   (paradoxical source in a patient with elevated right heart   pressure) The right ventricular systolic pressure was   increased consistent with severe pulmonary hypertension. Transesophageal echocardiography.  2D and color Doppler. Height:  Height: 182.9cm. Height: 72in.  Weight:  Weight: 112.3kg. Weight: 247lb.  Body mass index:  BMI:  33.6kg/m^2. Body surface area:    BSA: 2.57m^2.  Blood pressure: 114/75.  Patient status:  Inpatient.  Location:  ICU/CCU  ------------------------------------------------------------  ------------------------------------------------------------ Left ventricle:  Well visualized. There was moderate concentric hypertrophy. Systolic function was severely reduced. The estimated ejection fraction was in the range of 25% to 30%. Diffuse hypokinesis.  ------------------------------------------------------------ Aortic valve:   Structurally normal valve.   Cusp separation was normal.  No evidence of vegetation.  Doppler:   No regurgitation.  ------------------------------------------------------------ Aorta:  The aorta was normal, not dilated, and non-diseased.   ------------------------------------------------------------ Mitral valve:   Structurally normal valve.   Leaflet separation was normal.  No evidence of vegetation.  Doppler:   Trivial regurgitation.  ------------------------------------------------------------ Left atrium:  The atrium was moderately dilated.  ------------------------------------------------------------ Atrial septum:  There was a medium-sized valve-incompetent patent foramen ovale.  Doppler and agitated saline contrast study showed a moderate right-to-left shunt through a patent foramen ovale, in the baseline state.  ------------------------------------------------------------ Right ventricle:   Wall thickness was moderately increased.  Systolic function was moderately reduced. The estimated peak pressure was 5mm Hg.  ------------------------------------------------------------ Pulmonic valve:    Structurally normal valve.   Cusp separation was normal.  No evidence of vegetation.  ------------------------------------------------------------ Tricuspid valve:   Structurally normal valve.   Leaflet separation was normal.  No evidence of vegetation.   Doppler:   Mild regurgitation.  ------------------------------------------------------------ Right atrium:  The atrium was moderately dilated.   ------------------------------------------------------------ Post procedure conclusions Ascending Aorta:  - The aorta was normal, not dilated, and non-diseased.  ------------------------------------------------------------  Doppler measurements   Normal Tricuspid valve Regurg peak   369 cm/s ------ vel Peak RV-RA     54 mm   ------ gradient, S       Hg   ------------------------------------------------------------ Prepared and Electronically Authenticated by  Adrian Prows 2014-01-27T18:47:04.743   Wall Scoring    2D Measurements     Dimensions                              LVIDD (cm)     LVIDS (cm)      IVS (cm)     LV PW (cm)      FS (%)     EF (%)      LA size (cm)     LA volume (cm3)      LV mass (g)     LV volume (cm3)                                          Aortic Root Measurements - End Diastolic                             Annulus (cm)     Sinus (cm)      STJ (cm)     Ao-prox (cm)      Ao-asc (cm)     Ao-arch (cm)  Main Pulmonary Measurements - End Diastolic                                 Annulus (cm)     MPA (cm)      LPA (cm)     RPA (cm)                                  Inferior Vena Cava                               Ostium (cm)     Proximal (cm)       Doppler Measurements     Aortic Valve                              Stenosis  Value  Regurgitation  Value   LVOT diam (cm)     SV 1 (cm3)      LVOT area (cm2)     SV 2 (cm3)      LVOT pk vel (m/s)     Reg frac (%)      LVOT VTI (cm)     P 1/2 time (m/s)      Ao pk vel (m/s)     Vena cont (cm)      Ao VTI (cm)         Valve area (cm2)     PISA   Mean grad (mmHg)     Vnyquist (m/s)      Peak grad (mmHg)     Radius (cm)      Index-nat (V1/V2)     MR max vel (m/s)      Index-pros (V1/V2)     Area-PISA (cm2)              HCM    LVOT grad (mmHg)       Grad Vals (mmHg)       Grad Amyl (mmHg)       Grad exer (mmHg)                                     Mitral Valve                   Stenosis  Value  Regurgitation  Value   Mean grad (mmHg)     SV 1 (cm3)      Peak grad (mmHg)     SV 2 (cm3)      Area-PISA (cm2)     Reg frac (%)      Area-2D (cm2)     P 1/2 time (m/s)      Area-P 1/2 (cm2)     Vena cont (cm)      Area-cont eq (cm2)             Stress Evaluation  PISA   Exer grad (mmHg)     Vnyquist (m/s)      Exer area (mmHg)     Radius (cm)      Exer PAP (mmHg)     MR max vel (m/s)         Area-PISA (cm2)       Tricuspid Valve Stenosis  Value  Regurgitation  Value   RVOT diam (cm)     SV 1 (cm3)      RVOT area (cm2)     SV 2 (cm3)      RVOT pk vel (m/s)     Reg frac (%)      RVOT VTI (cm)     Vena cont (cm)      TV VTI (cm)         Mean grad (mmHg)       Peak grad (mmHg)       Valve area (cm2)           Stress Evaluation  PISA   Rest PAP (mmHg)     Vnyquist (m/s)      Peak PAP (mmHg)     Radius (cm)         MR max vel (m/s)       Area-PISA (cm2)       Pulmonic Valve Stenosis  Value  Regurgitation  Value   Mean grad (mmHg)     SV 1 (cm3)      Peak grad (mmHg)     SV 2 (cm3)      Valve area (cm2)     Reg frac (%)       Diastolic Filing E/A ratio     E decel time (msec)      IVRT (msec)     MV"A" dur (msec)      Pulm vein S/D ratio     Pulm AR dur (msec)      TDI     E/e' ratio      Shunt Ratio LVOT SV (cm3)     RVOT SV (cm3)      Qp:Qs ratio           Admission HPI: [name] [name] is an 57 y.o. male with hx of Schizophrenia, HTN, Hep C, Hx of polysubstance abuse, brought to the ER with altered mental status. He was not able to give insightful history, so it was difficult to ascertain his illness. Information was obtained directly from the EDP, and medical record. He was having confusion and slurred speech. He didn't have any seizure, HA, stiffneck, fever or chills. He fell yesterday and  complained of hurting his left hip. Evaluation in the ER showed a negative hip film, a head CT showed ischemic disease, with possible small infarct of undeterminable age. His EKG showed NSR without any acute ST-T changes. He has no fever and WBC of 11K. His Cr is 2.4, but his LFTs were very elevated. His CXR showed no definite infiltrate or vascular congestion. Hospitalist was asked to admit him for altered mental status, ?TIA.  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  Head CT showed chronic ischemic white matter changes and MRI of brain showed multiple small acute cerebellar infarcts, right greater than left.  Also concern for possible left brain stem infarct.  No tPA given as patient presented out of window.  He was started on high-dose aspirin.  Statin was contraindicated in setting of transaminitis.  No significant internal carotid artery stenosis noted on carotid  dopplers.  Transthoracic echocardiogram was notable for ejection fraction of 30-35% with hypokinesis of the inferior lateral heart wall.  He had a followup transesophageal echocardiogram which demonstrated a PFO with strongly positive contrast study for right-to-left shunting.  This points to paradoxical emboli as possible source of multiple infarctions; however LE extremity dopplers with no evidence of DVT.  In setting of negative Doppler studies, did not place patient on anticoagulation.  Per neurology, patient to remain on ASA 325mg  for secondary stroke prevention.  Symptoms of UE numbness and AMS resolved  during his hospitalization. He was evaluated by PT and determined to be a good candidate for inpatient rehab. [facility] accepted patient for post-stroke rehab and transferred on 02/29/12.   Rhabdomyolysis  On initial testing, [name] [name]' CK was above the upper limits of normal >50,000.  Etiology not clear.  No prolonged immobilization or crush injury.  UDS negative for cocaine, although has h/o use.  Not on statin therapy, has been on fenofibrate for hyperTG, but rhabdo would be rare. He is a chronic drinker with frequent binging and EtOH level 105 on admission.  Denies any symptoms of antecedent viral illness prior to admission on our questioning, although Dr. Barkley Bruns note documents endorsement of 1 week N/V/D prior to admission.  Nephrology was consulted during hospital course.  He was managed aggressively with IVF boluses, bicarb drip and lasix therapy.  Bicarb drip was eventually discontinued and IVF were continued and eventually will be discontinued upon discharge with continuation of lasix with transition to oral vs. IV.  Rhabdomyolysis was complicated by heme pigment-induced acute kidney injury (see below).  No hyperkalemia or hypercalcemia.  CK downtrended daily with bicarb drip, IVF and loop diuresis.  His CK on day of discharge to CIR was 3673.  Acute renal failure [name] [name] also presented with acute renal failure with Cr 2.3 on admission (baseline 1.3).  Urine demonstrated granular casts and a couple of RBC casts.  Suspect pigment-induced renal failure in setting of rhabdomyolysis w CK >50,000.  May also be ATN from hypotension, ACEi/NSAIDS.  Negative ANA, ANCA, GBM, C3/4, and SPEP.  He did have early oliguric phase with one day of UOP <30cc/hr.  His urine output improved with bicarb drip, IVF and loop diuretic treatment of rhabdo (see above) as well as likely component of post-ATN diuresis.  His creatinine peaked at 9.59 with BUN 80, but he had no evidence of volume overload, uremia, or derangement  of potassium to necessitate hemodialysis. Catheter removed during hospital course and replaced with condom catheter . His Cr gradually downtrended and was 4.37 on discharge to CIR.   Transaminitis Hospital course was also complicated by rising transaminases.  He presented with AST 521 ALT 298 with unremarkable alkaline phosphatase and preserved synthetic function (normal INR and total bilirubin).  On hospital day 3, AST/ALT peaked at 4600/1600.  UDS was negative. Denied tylenol use and negative acetaminophen level <15.  Acute hepatitis serologies were negative (he has chronic Hep C) and HIV Ab nonreactive.  Initially, rising transaminases were thought to be secondary to ischemic liver injury as patient had episode of hypotension with BP 80/50.  However, suspect that rhabdomyolysis-induced transaminitis is more likely etiology, as bilirubin and INR remained normal, suggesting preserved liver function.  He was seen by GI (Dr. Carlean Purl), who recommended nonurgent MRI abd+MRCP after acute issues resolved.  AST/ALT were 72/143 on day of discharge.  Statin therapy will be held until LFTs normalize, may be able to start as an outpatient with  close monitoring.  Thrombocytopenia  Platelets dropped on admission from 160-->78.  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  He did receive lovenox for DVT prophylaxis on admission but this was discontinued when platelets started to drop and SCDs were used for DVT prophylaxis.  DIC panel was ordered but with nonspecific findings and no active evidence of platelet consumption.  Platelet count recovered daily and was 279 on day of discharge to rehab.   Anion gap metabolic acidosis He presented with anion gap metabolic acidosis. No ketonuria or ingestions. Normoglycemic. Suspect secondary to uremia in setting of acute renal failure. Improved during hospital course.  AG on discharge to CIR was  15.  CHF--EF 30-35% Noted to have EF 30-35% with hypokinesis of the inferior lateral heart wall, no previous diagnosis of CHF.  No cath or further interventions performed due to multiple decompensated acute comorbidities. Seen by Dr. Einar Gip with Cardiology, who recommended isosorbide mononitirate/hydralazine combination pill 3 times a day along with low dose beta blocker coreg 3.125 mg PO bid.  Not a candidate for ACEi or ARB due to acute renal failure. Throughout admission, his heart failure was well compensated with no pulmonary or peripheral edema.  Not a candidate for ACEi or ARB due to ARF.  During admission he had a mild increase in troponin (peak 0.64).  No EKG changes to suggest ischemia.  Suspect related to ARF, no obvious ACS.  Troponin downtrended with treatment of renal failure.  He may be candidate for ACE/ARB as outpatient upon recovery of renal function.  ETOH abuse Ethanol level 105 on admission, pt admits to chronic EtOH and regular binge drinking (liquor). Was put on CIWA protocol during hospital course, no evidence of withdrawal, seizures, or DTs. Counseled on cessation.    Discharge Vitals:  BP 143/86  Pulse 69  Temp 98.4 F (36.9 C) (Oral)  Resp 17  Ht 6' (1.829 m)  Wt 263 lb 7.2 oz (119.5 kg)  BMI 35.73 kg/m2  SpO2 100%  Discharge Labs:  Results for orders placed during the hospital encounter of 02/19/12 (from the past 24 hour(s))  GLUCOSE, CAPILLARY     Status: Abnormal   Collection Time   02/28/12 10:13 PM      Component Value Range   Glucose-Capillary 135 (*) 70 - 99 mg/dL   Comment 1 Documented in Chart     Comment 2 Notify RN    RENAL FUNCTION PANEL     Status: Abnormal   Collection Time   02/29/12  7:00 AM      Component Value Range   Sodium 138  135 - 145 mEq/L   Potassium 3.9  3.5 - 5.1 mEq/L   Chloride 102  96 - 112 mEq/L   CO2 21  19 - 32 mEq/L   Glucose, Bld 117 (*) 70 - 99 mg/dL   BUN 54 (*) 6 - 23 mg/dL   Creatinine, Ser 4.37 (*) 0.50 - 1.35 mg/dL    Calcium 8.7  8.4 - 10.5 mg/dL   Phosphorus 4.8 (*) 2.3 - 4.6 mg/dL   Albumin 3.1 (*) 3.5 - 5.2 g/dL   GFR calc non Af Amer 14 (*) >90 mL/min   GFR calc Af Amer 16 (*) >90 mL/min  HEPATIC FUNCTION PANEL     Status: Abnormal   Collection Time   02/29/12  7:00 AM      Component Value Range   Total Protein 6.8  6.0 -  8.3 g/dL   Albumin 3.1 (*) 3.5 - 5.2 g/dL   AST 72 (*) 0 - 37 U/L   ALT 143 (*) 0 - 53 U/L   Alkaline Phosphatase 89  39 - 117 U/L   Total Bilirubin 0.5  0.3 - 1.2 mg/dL   Bilirubin, Direct 0.1  0.0 - 0.3 mg/dL   Indirect Bilirubin 0.4  0.3 - 0.9 mg/dL  CK     Status: Abnormal   Collection Time   02/29/12  7:00 AM      Component Value Range   Total CK 3673 (*) 7 - 232 U/L  CBC     Status: Abnormal   Collection Time   02/29/12  7:00 AM      Component Value Range   WBC 7.0  4.0 - 10.5 K/uL   RBC 4.15 (*) 4.22 - 5.81 MIL/uL   Hemoglobin 12.6 (*) 13.0 - 17.0 g/dL   HCT 38.4 (*) 39.0 - 52.0 %   MCV 92.5  78.0 - 100.0 fL   MCH 30.4  26.0 - 34.0 pg   MCHC 32.8  30.0 - 36.0 g/dL   RDW 14.4  11.5 - 15.5 %   Platelets 279  150 - 400 K/uL  MAGNESIUM     Status: Normal   Collection Time   02/29/12  7:00 AM      Component Value Range   Magnesium 1.6  1.5 - 2.5 mg/dL  GLUCOSE, CAPILLARY     Status: Abnormal   Collection Time   02/29/12  7:25 AM      Component Value Range   Glucose-Capillary 118 (*) 70 - 99 mg/dL   Comment 1 Documented in Chart     Comment 2 Notify RN    GLUCOSE, CAPILLARY     Status: Normal   Collection Time   02/29/12 11:24 AM      Component Value Range   Glucose-Capillary 96  70 - 99 mg/dL   Signed: Jerene Pitch 02/29/2012, 4:56 PM   Time Spent on Discharge: 40 minutes Services Ordered on Discharge: In patient rehab Equipment Ordered on Discharge: rolling walker

## 2012-02-29 NOTE — Progress Notes (Addendum)
I await updated therapy notes today to seek insurance approval to admit pt to inpt rehab today. I will contact attending MD to clarify medical readiness. [id number] I await insurance decision for admit today.

## 2012-02-29 NOTE — Progress Notes (Addendum)
Subjective: [name] [name] was seen and examined at bedside.  He continues to have intermittent headaches and claims his vision gets a little blurry on his left eye with the headaches since his stroke.  He has no other complaints at this time.  He denies any shortness breath, chest pain, chest pressure, N/V/D, fever, chills, or abdominal pain at this time.  Objective: Vital signs in last 24 hours: Filed Vitals:   02/28/12 1840 02/28/12 2217 02/29/12 0215 02/29/12 0543  BP: 149/99 151/85 153/89 138/74  Pulse: 78 74 69 58  Temp: 97.9 F (36.6 C) 98.1 F (36.7 C) 99 F (37.2 C) 98.9 F (37.2 C)  TempSrc: Oral Oral Oral Oral  Resp: 18 20 20 18   Height:      Weight:      SpO2: 98% 98% 96% 95%   Weight change:   Intake/Output Summary (Last 24 hours) at 02/29/12 0725 Last data filed at 02/29/12 0554  Gross per 24 hour  Intake    840 ml  Output   4830 ml  Net  -3990 ml  General:  HEENT: R pupil irregular shape (prior cataract surgery), MMM of OP, EOMI  Cardiac: RRR, no rubs, murmurs or gallops  Pulm: CTA b/l Abd: non tender to palpation, normoactive BS, soft  Ext: warm and well perfused, minimal pitting edema b/l lower extremities  Neuro: AAOX3,CN2-12 intact Sensation intact face, b/l upper and lower extremities and strength 5/5 face bilateral upper and lower extremities  Extremities: 1+ Pitting edema b/l, non tender to palpation Skin: tight, dry  Lab Results: Basic Metabolic Panel:  Lab [id number] 0700 02/28/12 0732  NA 138 136  K 3.9 3.8  CL 102 100  CO2 21 23  GLUCOSE 117* 108*  BUN 54* 58*  CREATININE 4.37* 5.42*  CALCIUM 8.7 8.5  MG 1.6 1.5  PHOS 4.8* 5.8*   Liver Function Tests:  Lab 02/29/12 0700 02/28/12 0732  AST 72* 91*  ALT 143* 182*  ALKPHOS 89 98  BILITOT 0.5 0.5  PROT 6.8 6.8  ALBUMIN 3.1*3.1* 3.0*3.1*   CBC:  Lab 02/29/12 0700 02/28/12 0732 02/27/12 0500 02/26/12 0600  WBC 7.0 6.8 -- --  NEUTROABS -- -- 4.1 4.1  HGB 12.6* 13.0 -- --  HCT 38.4*  39.7 -- --  MCV 92.5 92.1 -- --  PLT 279 266 -- --   Cardiac Enzymes:  Lab 02/29/12 0700 02/28/12 0732 02/27/12 0500 02/25/12 0500 02/24/12 0445 02/23/12 1849  CKTOTAL 3673* 4705* 7789* -- -- --  CKMB -- -- -- 11.2* 18.4* 23.1*  CKMBINDEX -- -- -- -- -- --  TROPONINI -- -- -- <0.30 0.50* 0.64*   D-Dimer:  Lab 02/22/12 1425  DDIMER 7.68*   CBG:  Lab 02/29/12 0725 02/28/12 2213 02/28/12 1647 02/28/12 1214 02/28/12 0656 02/27/12 2150  GLUCAP 118* 135* 112* 93 123* 119*   Thyroid Function Tests:  Lab 02/22/12 1141  TSH 3.160  T4TOTAL --  FREET4 --  T3FREE --  THYROIDAB --   Coagulation:  Lab 02/22/12 1425  LABPROT 14.6  INR 1.16   Urine Drug Screen: Drugs of Abuse     Component Value Date/Time   LABOPIA NONE DETECTED 02/20/2012 0105   COCAINSCRNUR NONE DETECTED 02/20/2012 0105   LABBENZ NONE DETECTED 02/20/2012 0105   AMPHETMU NONE DETECTED 02/20/2012 0105   THCU NONE DETECTED 02/20/2012 0105   LABBARB NONE DETECTED 02/20/2012 0105    Micro Results: Recent Results (from the past 240 hour(s))  URINE CULTURE  Status: Normal   Collection Time   02/21/12 10:15 AM      Component Value Range Status Comment   Specimen Description URINE, CATHETERIZED   Final    Special Requests NONE   Final    Culture  Setup Time 02/21/2012 21:40   Final    Colony Count NO GROWTH   Final    Culture NO GROWTH   Final    Report Status 02/22/2012 FINAL   Final   MRSA PCR SCREENING     Status: Normal   Collection Time   02/21/12 12:58 PM      Component Value Range Status Comment   MRSA by PCR NEGATIVE  NEGATIVE Final    Studies/Results: Ct Head Wo Contrast  02/28/2012  *RADIOLOGY REPORT*  Clinical Data: Left eye diplopia and headache, evaluate for hemorrhage  CT HEAD WITHOUT CONTRAST  Technique:  Contiguous axial images were obtained from the base of the skull through the vertex without contrast.  Comparison: MRI brain dated 02/20/2012  Findings: No evidence of parenchymal hemorrhage or  extra-axial fluid collection. No mass lesion, mass effect, or midline shift.  No CT evidence of acute infarction.  Specifically, the foci of restricted diffusion noted on recent MRI are not visualized on CT.  Subcortical white matter hypodensities, predominantly in the bilateral frontal lobes, left greater than right (series 2/image 20).  This appearance likely reflects small vessel ischemic changes.  Mild cortical atrophy.  No ventriculomegaly.  Mucosal thickening in the bilateral ethmoid and maxillary sinuses. The mastoid air cells are clear.  No evidence of calvarial fracture.  IMPRESSION: No evidence of intracranial hemorrhage.  Foci of restricted effusion noted on recent MRI are not visualized on CT.  Atrophy with small vessel ischemic changes.   Original Report Authenticated By: Julian Hy, M.D.    Dg Chest Port 1 View  02/29/2012  *RADIOLOGY REPORT*  Clinical Data: Possible edema  PORTABLE CHEST - 1 VIEW  Comparison: Portable chest x-ray of 02/18/2010  Findings: The lungs are not optimally aerated but no focal infiltrate or effusion is seen.  Cardiomegaly is stable.  No skeletal abnormality is seen.  IMPRESSION: Stable cardiomegaly.  No active lung disease.   Original Report Authenticated By: Ivar Drape, M.D.    Medications: I have reviewed the patient's current medications. Scheduled Meds:    . aspirin  325 mg Oral Daily  . calcium carbonate  400 mg of elemental calcium Oral TID WC  . carvedilol  6.25 mg Oral BID WC  . folic acid  1 mg Oral Daily  . furosemide  40 mg Intravenous BID  . heparin subcutaneous  5,000 Units Subcutaneous Q8H  . isosorbide-hydrALAZINE  1 tablet Oral TID  . magnesium oxide  200 mg Oral BID  . multivitamin with minerals  1 tablet Oral Daily  . thiamine  100 mg Oral Daily  . traZODone  100 mg Oral QHS   Continuous Infusions:    . sodium chloride 75 mL/hr at 02/29/12 0309   PRN Meds:.acetaminophen, ondansetron (ZOFRAN) IV,  oxyCODONE Assessment/Plan:  [name] [name] is 57 y.o. male with a pmh of schizophrenia, HTN, Hep C, h/o polysubstance abuse (cocaine/EtOH) admitted with confusion and found to have multiple cerebellar infarcts, rhabdomyolysis, and acute renal failure and transaminitis.   1) Multiple acute cerebellar infarcts and chronic ischemic changes  Suspect embolic source due to distribution, supported by TEE this admission which showed PFO and R-->L shunt. LE dopplers negative for DVT  - Continue ASA 325mg  for secondary  stroke prophylaxis  - A1C 5.8 --> nondiabetic  - LDL 121--> statin therapy contraindicated w transaminitis and rhabdo  - PT/OT: recommend CIR, pt evaluated and accepted by rehab MD for transfer, insurance approval pending.  Expect Monday 2/3.   2) Acute renal failure--Baseline Cr is around 1.2-1.3. Cr on admission 2.4 and peaked at 9.5 on 1/29, now downtrending  UA on admission notable for granular and RBC casts. Suspect pigment-induced renal failure in setting of rhabdo w CK >50,000. May also be ATN from hypotension, ACEi/NSAIDS vs AIN vs glomerulonephritis. UOP continues to improve. net 7L negative with decreased lasix 40 IV BID and NS 75cc/hr.  No urgent indication for HD now.    Lab 02/29/12 0700 02/28/12 0732 02/27/12 0500 02/26/12 0600 02/25/12 0500  CREATININE 4.37* 5.42* 7.25*7.37* 8.64* 9.29*   - Renal signed off 2/2--recommend IVF @75  and continue lasix IV 40 BID x1 more days then stop fluids and transition to PO lasix and stop at discharge.    - OFF Bicarb drip since 02/24/12. Lasix decreased to 40 BID  - Negative ANA, ANCA, GBM, C3, C4, SPEP, cryoglobulin none detected  3) Fluid overload  Likely associated with hydration for treatment of rhabdomyolysis and chronic CHF.  No signs of acute CHF.  - am cxr--no focal infiltrate or effusion seen - Will monitor for signs and symptoms of respiratory distress, pulmonary edema in a setting of chronic CHF with EF of 25-30%. -  Continue IVF and lasix per renal recommendations  -3) Transaminitis--preserved synthetic function of liver. Acute hepatitis and HIV negative. Chronic Hep C. Negative tylenol level. Negative ANA. Abdominal ultrasound demonstrates dense hepatic architecture suspicious for cirrhosis, no evidence of infarction. CBD also enlarged at 11 without clear etiology, no gall stones.  Suspect multifactorial : rhabdo, chronic + binge EtOH use, and shock liver in setting of hypotension. Transaminases are downtrending.  - GI consulted during admission, Dr. Gwenyth Ober abdomen + MRCP outpatient for CBD enlargement  - Trend LFTs. Much improvement.   Lab 02/29/12 0700 02/28/12 0732 02/27/12 0500 02/26/12 0600 02/25/12 0500 02/22/12 1425  AST 72* 91* 114* 155* 247* --  ALT 143* 182* 235* 294* 409* --  ALKPHOS 89 98 102 107 115 --  BILITOT 0.5 0.5 0.5 0.6 0.6 --  PROT 6.8 6.8 6.8 6.4 6.0 --  ALBUMIN 3.1*3.1* 3.0*3.1* 3.0*2.9* 2.8*2.7* 2.5*2.4* --  INR -- -- -- -- -- 1.16   4) Rhabdomyolysis  Fell on left hip prior to admission. Initially, CK above upper limits normal >50,000.  Etiology not clear. No prolonged immobilization. UDS negative for cocaine, although has h/o use. Not on statin therapy, has been on fenofibrate for hyperTG, but rhabdo would be rare SE. Is chronic drinker with frequent binging and EtOH level 105 on admission.  Denies any symptoms of antecedent viral illness prior to admission on our questioning, although Dr. Barkley Bruns note documents endorsement of 1 week N/V/D prior to admission.  Was managed aggressively with IVF boluses, bicarb drip and lasix QID. Bicarb drip discontinued 02/24/12, on lasix BID and NS at 75cc/hr. No hyperkalemia. CK continues to downtrend. - Renal following signed off 2/2 - Continue IVF and Lasix 40mg  IV BID then transition to PO per renal recommendations  5) Thrombocytopenia  Resolved. Platelets decreased from 168 on admission to 78 on 02/23/12. Platelets 247 02/27/12.   - Heparin subcutaneous added 2/2 for DVT prophylaxis  6) Anion Gap Metabolic Acidosis--improving AG 14-->12-->15 . Likely in setting of renal failure that is improving.  - Trend  bmet   7) Congestive heart failure w EF 30-35%  Noted to have poor EF on echo, no previous dx CHF. Presently well compensated congestive heart failure. Not a candidate for ACEi or ARB due to ARF. Seen by cardiology, Dr. Einar Gip.  - Coreg BID and Bidil TID   8) ? UTI--Trace leuks and cloudy urine. Had leukocytosis on admission which has resolved.  No organism on culture. Received abx therapy with ceftriaxone for several days, d/c 1/27.   9) EtOH abuse: no signs of withdrawal  CIWA   10) Hypertension  Had on episode of hypotension during admission but now hypertensive. Not a candidate for ACEi or ARB due to ARF.  - bidil TID and coreg  BID   Diet: Regular  DVT Ppx: Heparin Dispo: CIR accepted, anticipate 02/29/12 transfer  The patient does have a current PCP ([facility]), therefore will be requiring OPC follow-up after discharge.  The patient does not have transportation limitations that hinder transportation to clinic appointments.   .Services Needed at time of discharge: Y = Yes, Blank = No PT: CIR  OT:   RN:   Equipment: Rolling walker with 5" wheels  Other:     LOS: 10 days   Jerene Pitch 02/29/2012, 7:25 AM   Internal Medicine Teaching Service Attending Note Date: 02/29/2012  Patient name: [name] [name]  Medical record number: [id number]  Date of birth: 1955-08-19    This patient has been seen and discussed with the house staff. Please see their note for complete details. I concur with their findings with the following additions/corrections:  Patient recovering. Agree with finishing day of IVF and diuresing. CK continuing down.  Alcide Evener 02/29/2012, 4:01 PM

## 2012-02-29 NOTE — H&P (Signed)
Physical Medicine and Rehabilitation Admission H&P    Chief Complaint  Patient presents with impaired balance and vision.  .   : HPI: [name] [name] is a 57 y.o. right-handed male with history of hypertension, schizophrenia polysubstance abuse and hepatitis C. Patient lives alone in independent prior to admission. Admitted 02/20/2012 with altered mental status, left-sided numbness and slurred speech. MRI of the brain showed small acute cerebellar infarcts right greater than left as well as questionable acute left external capsule and caudate nuclear punctate lacunar infarct. Echocardiogram with ejection fraction of 30% with diffuse hypokinesis. Patient did not receive TPA. TEE completed showing PFO with right to left shunt, ejection fraction 30%. Lower extremity Dopplers showed no signs of DVT. Neurology services was consulted and presently maintained on aspirin therapy for stroke prophylaxis. Noted admission creatinine of 2.34 with latest check creatinine 1.2 02/08/2012 elevated to 9.54 and renal services has been consulted. Acute renal failure likely multi-factorial from ATN from hypotension. Placed on intravenous Lasix and monitored. Patient did not receive hemodialysis with latest creatinine 4.37 showing good improvement as he continues maintained on intravenous fluids and Lasix. Urine drug screen upon admission was negative noted alcohol level 105. Findings of elevated liver function studies upon admission was noted reports of nausea and vomiting over the past week prior to admission. Abdominal ultrasound showed dense slightly heterogeneous liver with morphologic suggestive of cirrhosis. Latest liver function panel 02/24/2012 with steady improvement. Followup cardiology services for findings of chronic systolic and diastolic heart failure with mild elevation in troponins along with rhabdomyolysis. 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 Not a candidate for ACE inhibitors or ARB due to 2  acute renal failure and advised to monitor. Patient is maintained on a regular consistency diet . Therapy evaluation completed 02/22/2012 noted patient needing some encouragement to participate also limited eval secondary to medical workup. Recommendations were made for physical medicine rehabilitation consult to consider inpatient rehabilitation . Patient was felt to be a good candidate for inpatient rehabilitation services and was admitted for a comprehensive rehabilitation program today.  Review of Systems  Gastrointestinal: Positive for nausea and vomiting.  Musculoskeletal: Positive for myalgias and back pain.  Neurological: Positive for speech change. Lower extremity edema. Blurred vision/diplopia Psychiatric/Behavioral:  Schizophrenia  All other systems reviewed and are negative   Past Medical History  Diagnosis Date  . Hypertension   . Schizophrenia   . Hepatitis C   . HIT (heparin-induced thrombocytopenia)   . H/O: alcohol abuse   . H/O cocaine abuse    Past Surgical History  Procedure Date  . Back surgery    Family History  Problem Relation Age of Onset  . CAD Mother   . CAD Father   . CAD Sister    Social History:  reports that he has never smoked. He does not have any smokeless tobacco history on file. He reports that he drinks alcohol. He reports that he does not use illicit drugs. Allergies: No Known Allergies Medications Prior to Admission  Medication Sig Dispense Refill  . cloNIDine (CATAPRES) 0.3 MG tablet Take 0.3 mg by mouth 2 (two) times daily.      . furosemide (LASIX) 20 MG tablet Take 20 mg by mouth daily.      Marland Kitchen labetalol (NORMODYNE) 100 MG tablet Take 100 mg by mouth 2 (two) times daily.      Marland Kitchen lisinopril (PRINIVIL,ZESTRIL) 20 MG tablet Take 20 mg by mouth daily.      . traZODone (DESYREL) 100 MG tablet Take 100  mg by mouth at bedtime.        Home: Home Living Lives With: Alone Available Help at Discharge: Family;Friend(s) Type of Home:  Apartment Home Access: Level entry Home Layout: One level Home Adaptive Equipment: None Additional Comments: states friend might be able to stay with him, not sure if 24 hour, sister checks in frequently when he is at home   Functional History: Prior Function Driving: No  Functional Status:  Mobility: Bed Mobility Bed Mobility: Not assessed Supine to Sit: 4: Min assist Sit to Supine: 4: Min assist Transfers Transfers: Sit to Stand;Stand to Sit Sit to Stand: 4: Min guard;With upper extremity assist;With armrests;From chair/3-in-1 Stand to Sit: 4: Min assist;Without upper extremity assist;To chair/3-in-1 Ambulation/Gait Ambulation/Gait Assistance: 4: Min guard Ambulation/Gait: Patient Percentage: 70% Ambulation Distance (Feet): 160 Feet Assistive device: Rolling walker Ambulation/Gait Assistance Details: still favoring LLE due to hip pain (reports he fell onto Lt side and laid there about 30 minutes not able to get up); vc for upright posture and safe use of RW Gait Pattern: Step-to pattern;Decreased stride length;Wide base of support Gait velocity: decreased Stairs: No    ADL: ADL Lower Body Dressing: +1 Total assistance Where Assessed - Lower Body Dressing: Supported sitting Toilet Transfer: Minimal assistance Toilet Transfer Method: Sit to stand Toilet Transfer Equipment: Raised toilet seat with arms (or 3-in-1 over toilet) Equipment Used: Gait belt (stedy) Transfers/Ambulation Related to ADLs: Pt completed basic transfer pushing up with BIL UE from chair. pt requires elevated surface for transfers at this time. pt with delayed response using Lt UE but pt attempting without cues ADL Comments: Pt sitting in chair on arrival. Pt educated on using stedy as a RW. Pt progressed to static standing a window looking at snow. Pt demonstrates ability to ambulate to 3n1 and sink level task  Cognition: Cognition Arousal/Alertness: Awake/alert Orientation Level: Oriented  X4 Cognition Overall Cognitive Status: Appears within functional limits for tasks assessed/performed Arousal/Alertness: Awake/alert Orientation Level: Appears intact for tasks assessed Behavior During Session: [facility] for tasks performed   Blood pressure 138/74, pulse 58, temperature 98.9 F (37.2 C), temperature source Oral, resp. rate 18, height 6' (1.829 m), weight 119.5 kg (263 lb 7.2 oz), SpO2 95.00%. Physical Exam  Vitals reviewed.  HENT:  Head: Normocephalic.  Eyes:  Pupils are round and reactive to light  Neck: Neck supple. No thyromegaly present.  Cardiovascular: Normal rate and regular rhythm.  Pulmonary/Chest: Effort normal and breath sounds normal. No respiratory distress.  Abdominal: Soft. Bowel sounds are normal. He exhibits no distension. There is no tenderness. There is no rebound.  Neurological: He is alert.  Patient with flat affect but appropriate during exam. He was able remain person, place as well as age. He followed simple commands. Motor strength is 5/5 in bilateral deltoid, biceps, triceps, grip, hip flexors, knee extensors, ankle dorsiflexor and plantar flexor  Sensory is intact to light touch in both upper and lower limbs  Finger nose is notable for past pointing bilaterally.  Heel-to-shin test is normal. Gaze is dysconjugate with left eye limited in tracking in all fields. Right eye seems to stray to the right but with more consistent tracking.   Results for orders placed during the hospital encounter of 02/19/12 (from the past 48 hour(s))  GLUCOSE, CAPILLARY     Status: Abnormal   Collection Time   02/27/12 12:01 PM      Component Value Range Comment   Glucose-Capillary 121 (*) 70 - 99 mg/dL    Comment  1 Notify RN      Comment 2 Documented in Chart     GLUCOSE, CAPILLARY     Status: Abnormal   Collection Time   02/27/12  5:00 PM      Component Value Range Comment   Glucose-Capillary 134 (*) 70 - 99 mg/dL    Comment 1 Documented in Chart      Comment 2  Notify RN     GLUCOSE, CAPILLARY     Status: Abnormal   Collection Time   02/27/12  9:50 PM      Component Value Range Comment   Glucose-Capillary 119 (*) 70 - 99 mg/dL   GLUCOSE, CAPILLARY     Status: Abnormal   Collection Time   02/28/12  6:56 AM      Component Value Range Comment   Glucose-Capillary 123 (*) 70 - 99 mg/dL   RENAL FUNCTION PANEL     Status: Abnormal   Collection Time   02/28/12  7:32 AM      Component Value Range Comment   Sodium 136  135 - 145 mEq/L    Potassium 3.8  3.5 - 5.1 mEq/L    Chloride 100  96 - 112 mEq/L    CO2 23  19 - 32 mEq/L    Glucose, Bld 108 (*) 70 - 99 mg/dL    BUN 58 (*) 6 - 23 mg/dL    Creatinine, Ser 5.42 (*) 0.50 - 1.35 mg/dL    Calcium 8.5  8.4 - 10.5 mg/dL    Phosphorus 5.8 (*) 2.3 - 4.6 mg/dL    Albumin 3.0 (*) 3.5 - 5.2 g/dL    GFR calc non Af Amer 11 (*) >90 mL/min    GFR calc Af Amer 12 (*) >90 mL/min   HEPATIC FUNCTION PANEL     Status: Abnormal   Collection Time   02/28/12  7:32 AM      Component Value Range Comment   Total Protein 6.8  6.0 - 8.3 g/dL    Albumin 3.1 (*) 3.5 - 5.2 g/dL    AST 91 (*) 0 - 37 U/L    ALT 182 (*) 0 - 53 U/L    Alkaline Phosphatase 98  39 - 117 U/L    Total Bilirubin 0.5  0.3 - 1.2 mg/dL    Bilirubin, Direct 0.1  0.0 - 0.3 mg/dL    Indirect Bilirubin 0.4  0.3 - 0.9 mg/dL   CK     Status: Abnormal   Collection Time   02/28/12  7:32 AM      Component Value Range Comment   Total CK 4705 (*) 7 - 232 U/L   CBC     Status: Normal   Collection Time   02/28/12  7:32 AM      Component Value Range Comment   WBC 6.8  4.0 - 10.5 K/uL    RBC 4.31  4.22 - 5.81 MIL/uL    Hemoglobin 13.0  13.0 - 17.0 g/dL    HCT 39.7  39.0 - 52.0 %    MCV 92.1  78.0 - 100.0 fL    MCH 30.2  26.0 - 34.0 pg    MCHC 32.7  30.0 - 36.0 g/dL    RDW 14.5  11.5 - 15.5 %    Platelets 266  150 - 400 K/uL   MAGNESIUM     Status: Normal   Collection Time   02/28/12  7:32 AM      Component Value Range Comment  Magnesium 1.5  1.5 - 2.5 mg/dL    GLUCOSE, CAPILLARY     Status: Normal   Collection Time   02/28/12 12:14 PM      Component Value Range Comment   Glucose-Capillary 93  70 - 99 mg/dL    Comment 1 Documented in Chart      Comment 2 Notify RN     GLUCOSE, CAPILLARY     Status: Abnormal   Collection Time   02/28/12  4:47 PM      Component Value Range Comment   Glucose-Capillary 112 (*) 70 - 99 mg/dL    Comment 1 Documented in Chart      Comment 2 Notify RN     GLUCOSE, CAPILLARY     Status: Abnormal   Collection Time   02/28/12 10:13 PM      Component Value Range Comment   Glucose-Capillary 135 (*) 70 - 99 mg/dL    Comment 1 Documented in Chart      Comment 2 Notify RN     RENAL FUNCTION PANEL     Status: Abnormal   Collection Time   02/29/12  7:00 AM      Component Value Range Comment   Sodium 138  135 - 145 mEq/L    Potassium 3.9  3.5 - 5.1 mEq/L    Chloride 102  96 - 112 mEq/L    CO2 21  19 - 32 mEq/L    Glucose, Bld 117 (*) 70 - 99 mg/dL    BUN 54 (*) 6 - 23 mg/dL    Creatinine, Ser 4.37 (*) 0.50 - 1.35 mg/dL    Calcium 8.7  8.4 - 10.5 mg/dL    Phosphorus 4.8 (*) 2.3 - 4.6 mg/dL    Albumin 3.1 (*) 3.5 - 5.2 g/dL    GFR calc non Af Amer 14 (*) >90 mL/min    GFR calc Af Amer 16 (*) >90 mL/min   HEPATIC FUNCTION PANEL     Status: Abnormal   Collection Time   02/29/12  7:00 AM      Component Value Range Comment   Total Protein 6.8  6.0 - 8.3 g/dL    Albumin 3.1 (*) 3.5 - 5.2 g/dL    AST 72 (*) 0 - 37 U/L    ALT 143 (*) 0 - 53 U/L    Alkaline Phosphatase 89  39 - 117 U/L    Total Bilirubin 0.5  0.3 - 1.2 mg/dL    Bilirubin, Direct 0.1  0.0 - 0.3 mg/dL    Indirect Bilirubin 0.4  0.3 - 0.9 mg/dL   CK     Status: Abnormal   Collection Time   02/29/12  7:00 AM      Component Value Range Comment   Total CK 3673 (*) 7 - 232 U/L   CBC     Status: Abnormal   Collection Time   02/29/12  7:00 AM      Component Value Range Comment   WBC 7.0  4.0 - 10.5 K/uL    RBC 4.15 (*) 4.22 - 5.81 MIL/uL    Hemoglobin 12.6 (*)  13.0 - 17.0 g/dL    HCT 38.4 (*) 39.0 - 52.0 %    MCV 92.5  78.0 - 100.0 fL    MCH 30.4  26.0 - 34.0 pg    MCHC 32.8  30.0 - 36.0 g/dL    RDW 14.4  11.5 - 15.5 %    Platelets 279  150 - 400  K/uL   MAGNESIUM     Status: Normal   Collection Time   02/29/12  7:00 AM      Component Value Range Comment   Magnesium 1.6  1.5 - 2.5 mg/dL   GLUCOSE, CAPILLARY     Status: Abnormal   Collection Time   02/29/12  7:25 AM      Component Value Range Comment   Glucose-Capillary 118 (*) 70 - 99 mg/dL    Comment 1 Documented in Chart      Comment 2 Notify RN      Ct Head Wo Contrast  02/28/2012  *RADIOLOGY REPORT*  Clinical Data: Left eye diplopia and headache, evaluate for hemorrhage  CT HEAD WITHOUT CONTRAST  Technique:  Contiguous axial images were obtained from the base of the skull through the vertex without contrast.  Comparison: MRI brain dated 02/20/2012  Findings: No evidence of parenchymal hemorrhage or extra-axial fluid collection. No mass lesion, mass effect, or midline shift.  No CT evidence of acute infarction.  Specifically, the foci of restricted diffusion noted on recent MRI are not visualized on CT.  Subcortical white matter hypodensities, predominantly in the bilateral frontal lobes, left greater than right (series 2/image 20).  This appearance likely reflects small vessel ischemic changes.  Mild cortical atrophy.  No ventriculomegaly.  Mucosal thickening in the bilateral ethmoid and maxillary sinuses. The mastoid air cells are clear.  No evidence of calvarial fracture.  IMPRESSION: No evidence of intracranial hemorrhage.  Foci of restricted effusion noted on recent MRI are not visualized on CT.  Atrophy with small vessel ischemic changes.   Original Report Authenticated By: Julian Hy, M.D.    Dg Chest Port 1 View  02/29/2012  *RADIOLOGY REPORT*  Clinical Data: Possible edema  PORTABLE CHEST - 1 VIEW  Comparison: Portable chest x-ray of 02/18/2010  Findings: The lungs are not optimally  aerated but no focal infiltrate or effusion is seen.  Cardiomegaly is stable.  No skeletal abnormality is seen.  IMPRESSION: Stable cardiomegaly.  No active lung disease.   Original Report Authenticated By: Ivar Drape, M.D.     Post Admission Physician Evaluation: 1. Functional deficits secondary  to embolic bicerebral infarcts. 2. Patient is admitted to receive collaborative, interdisciplinary care between the physiatrist, rehab nursing staff, and therapy team. 3. Patient's level of medical complexity and substantial therapy needs in context of that medical necessity cannot be provided at a lesser intensity of care such as a SNF. 4. Patient has experienced substantial functional loss from his/her baseline which was documented above under the "Functional History" and "Functional Status" headings.  Judging by the patient's diagnosis, physical exam, and functional history, the patient has potential for functional progress which will result in measurable gains while on inpatient rehab.  These gains will be of substantial and practical use upon discharge  in facilitating mobility and self-care at the household level. 5. Physiatrist will provide 24 hour management of medical needs as well as oversight of the therapy plan/treatment and provide guidance as appropriate regarding the interaction of the two. 6. 24 hour rehab nursing will assist with bladder management, bowel management, safety, skin/wound care, disease management, medication administration, pain management and patient education  and help integrate therapy concepts, techniques,education, etc. 7. PT will assess and treat for:  Lower extremity strength, range of motion, stamina, balance, functional mobility, safety, adaptive techniques and equipment, NMR, visual-spatial deficits, education.  Goals are: mod I. 8. OT will assess and treat for: ADL's, functional mobility, safety, upper  extremity strength, adaptive techniques and equipment, NMR,  visual-spatial deficits.   Goals are: mod I. 9. SLP will assess and treat for: n/a.  Goals are: n/a. 10. Case Management and Social Worker will assess and treat for psychological issues and discharge planning. 11. Team conference will be held weekly to assess progress toward goals and to determine barriers to discharge. 12. Patient will receive at least 3 hours of therapy per day at least 5 days per week. 13. ELOS 7-10 days      Prognosis:  excellent   Medical Problem List and Plan: 1. Embolic bilateral cerebellar infarcts 2. DVT Prophylaxis/Anticoagulation: Subcutaneous heparin. Monitor platelet counts and any signs of bleeding 3. Mood/schizophrenia. Provide emotional support and positive reinforcement. Patient states he takes Abilify prior to admission although this is not documented in the old records. We'll discuss further with patient 4. Neuropsych: This patient is capable of making decisions on his/her own behalf. 5. Acute renal failure in setting of rhabdomyolysis. Felt to be multi-factorial from ATN/hypotension. Currently remains on IV fluids 75 cc an hour and Lasix to be changed to 40 mg by mouth twice a day. Weight patient daily followup chemistries with latest creatinine 4.37. Renal services following 6. Chronic systolic and diastolic heart failure. Latest chest x-ray no active disease. Continue Lasix therapy. Followup per cardiology services as needed 7. Hypertension. Coreg 6.25 mg twice a day, Bidil 20-37.5 mg 3 times a day. Monitor with increased activity 8. Alcohol/polysubstance abuse. Monitor for signs of withdrawal and provide counseling  02/29/2012  Meredith Staggers, MD, [facility]

## 2012-02-29 NOTE — PMR Pre-admission (Signed)
PMR Admission Coordinator Pre-Admission Assessment  Patient: [name] [name] is an 57 y.o., male MRN: [id number] DOB: 1955-07-24 Height: 6' (182.9 cm) Weight: 119.5 kg (263 lb 7.2 oz)              Insurance Information HMO: yes    PPO:      PCP:      IPA:      80/20:      OTHER: medicare replacement policy PRIMARY: [employer]      Policy#: [id number]      Subscriber: pt CM Name: Colletta [place]      Phone#: [phone]     Fax#: [phone] Pre-Cert#: [id number] approved for 5 days      Employer: disabled/ onsiteCM will be Gaylan Gerold at [id number] Benefits:  Phone #: [phone]     Name: Teressa Senter 1/29 Eff. Date: 10/27/11 active     Deduct: $147      Out of Pocket Max: $6000      Life Max: none CIR: $1216 per admission      SNF: no copay days 1 thru 20, $152 per day days 21 thru 100 100 days per benefit Outpatient: 80%     Co-Pay: no visit limits Home Health: 100%      Co-Pay: no visit limits DME: 80%     Co-Pay: 20% Providers: in network  SECONDARY: Medicaid      Policy#: [id number]      Subscriber: pt  Emergency Contact Information Contact Information    Name [place] Sister [phone]  [phone]     Current Medical History  Patient Admitting Diagnosis:Bilateral cerebellar infarcts with truncal ataxia  History of Present Illness: [name] [name] is a 57 y.o. right-handed male with history of hypertension, schizophrenia polysubstance abuse and hepatitis C. Patient lives alone in independent prior to admission. Admitted 02/20/2012 with altered mental status, left-sided numbness and slurred speech. MRI of the brain showed small acute cerebellar infarcts right greater than left as well as questionable acute left external capsule and caudate nuclear punctate lacunar infarct. Echocardiogram with ejection fraction of 30% with diffuse hypokinesis. Patient did not receive TPA. TEE completed showing PFO with right to left shunt, ejection fraction 30%. Lower  extremity Dopplers showed no signs of DVT. Neurology services was consulted and presently maintained on aspirin therapy for stroke prophylaxis. Noted admission creatinine of 2.34 with latest check creatinine 1.2 02/08/2012 elevated to 9.54 and renal services has been consulted. Acute renal failure likely multi-factorial from ATM from hypotension. Placed on intravenous Lasix and monitored. Urine drug screen upon admission was negative noted alcohol level 105. Findings of elevated liver function studies upon admission was noted reports of nausea and vomiting over the past week prior to admission. Abdominal ultrasound showed dense slightly heterogeneous liver with morphologic suggestive of cirrhosis. Latest liver function panel 02/24/2012 with steady improvement.  Nephrology 02/28/12 felt ARF likely related to ATN from hypotension, ACEI and NSAID use along with rhabdo kidney injury, never required HD. Creat trending down and now in ths %s. Great UOP on low dose lasix which they would like to keep on a little due to high BP, but continue IVF at decreased rate. Continue IVF at 75 and 40 IV lasix BID another 2 days and then change lasix to po 40 BID.  Total: 1  NIH    Past Medical History  Past Medical History  Diagnosis Date  . Hypertension   . Schizophrenia   . Hepatitis C   . HIT (heparin-induced thrombocytopenia)   .  H/O: alcohol abuse   . H/O cocaine abuse     Family History  family history includes CAD in his father, mother, and sister.  Prior Rehab/Hospitalizations: none   Current Medications  Current facility-administered medications:0.9 %  sodium chloride infusion, , Intravenous, Continuous, Louis Meckel, MD, Last Rate: 75 mL/hr at 02/29/12 0309;  acetaminophen (TYLENOL) tablet 325 mg, 325 mg, Oral, Q6H PRN, Jerene Pitch, MD, 325 mg at 02/29/12 0309;  aspirin tablet 325 mg, 325 mg, Oral, Daily, Orvan Falconer, MD, 325 mg at 02/29/12 1215 calcium carbonate (TUMS - dosed in mg elemental  calcium) chewable tablet 400 mg of elemental calcium, 400 mg of elemental calcium, Oral, TID WC, Gerda Diss, DO, 400 mg of elemental calcium at 02/29/12 1215;  carvedilol (COREG) tablet 6.25 mg, 6.25 mg, Oral, BID WC, Jerene Pitch, MD, 6.25 mg at [id number];  folic acid (FOLVITE) tablet 1 mg, 1 mg, Oral, Daily, Monika Salk, MD, 1 mg at 02/29/12 1216 furosemide (LASIX) injection 40 mg, 40 mg, Intravenous, BID, Tonia Brooms, MD, 40 mg at 02/29/12 1216;  heparin injection 5,000 Units, 5,000 Units, Subcutaneous, Q8H, Na Li, MD, 5,000 Units at 02/29/12 [id number];  isosorbide-hydrALAZINE (BIDIL) 20-37.5 MG per tablet 1 tablet, 1 tablet, Oral, TID, Tonia Brooms, MD, 1 tablet at 02/29/12 1216;  magnesium oxide (MAG-OX) tablet 200 mg, 200 mg, Oral, BID, Jerene Pitch, MD, 200 mg at 02/29/12 1216 multivitamin with minerals tablet 1 tablet, 1 tablet, Oral, Daily, Monika Salk, MD, 1 tablet at 02/29/12 1215;  ondansetron [facility]) injection 4 mg, 4 mg, Intravenous, Q8H PRN, Hewitt Shorts [place], PA, 4 mg at 02/26/12 1221;  oxyCODONE (Oxy IR/ROXICODONE) immediate release tablet 10 mg, 10 mg, Oral, Q4H PRN, Tonia Brooms, MD, 10 mg at 02/29/12 [id number];  polyethylene glycol (MIRALAX / GLYCOLAX) packet 17 g, 17 g, Oral, Daily, Axel Filler, MD thiamine (VITAMIN B-1) tablet 100 mg, 100 mg, Oral, Daily, Monika Salk, MD, 100 mg at 02/29/12 1215;  traZODone (DESYREL) tablet 100 mg, 100 mg, Oral, QHS, Orvan Falconer, MD, 100 mg at 02/26/12 2104  Patients Current Diet: General Regular diet with thin liquids  Precautions / Restrictions Precautions Precautions: Fall Restrictions Weight Bearing Restrictions: No   Prior Activity Level   Home Assistive Devices / Equipment Home Assistive Devices/Equipment: None Home Adaptive Equipment: None  Prior Functional Level Prior Function Level of Independence: Independent Able to Take Stairs?: Yes Driving: No Vocation: On disability  Current Functional Level Cognition   Arousal/Alertness: Awake/alert Overall Cognitive Status: Appears within functional limits for tasks assessed/performed Orientation Level: Oriented X4    Extremity Assessment (includes Sensation/Coordination)  RUE ROM/Strength/Tone: Within functional levels RUE Sensation: WFL - Light Touch RUE Coordination: WFL - gross/fine motor  RLE ROM/Strength/Tone: WFL for tasks assessed RLE Coordination: WFL - gross motor    ADLs  Eating/Feeding: Independent;Modified independent Where Assessed - Eating/Feeding: Chair Grooming: Wash/dry hands;Wash/dry face;Teeth care;Brushing hair;Minimal assistance Where Assessed - Grooming: Supported standing Upper Body Bathing: Supervision/safety Where Assessed - Upper Body Bathing: Supported sitting Lower Body Bathing: Minimal assistance Where Assessed - Lower Body Bathing: Supported sit to stand Upper Body Dressing: Minimal assistance Where Assessed - Upper Body Dressing: Unsupported sitting Lower Body Dressing: Moderate assistance Where Assessed - Lower Body Dressing: Supported sit to Lobbyist: Minimal assistance Toilet Transfer Method: Sit to Loss adjuster, chartered: Comfort height toilet Toileting - Clothing Manipulation and Hygiene: Minimal assistance Where Assessed - Best boy and Hygiene: Standing Equipment Used: Gait belt (stedy)  Transfers/Ambulation Related to ADLs: Pt requires min A to ambulate into BR with RW ADL Comments: Pt. with difficulty accessing feet/LEs for LB ADLs.  Balance minimally impaired.  Pt reports dizziness when standing and ambulating.  Instructed him to stabilize gaze when moving.  VISION:  Pt with a congenital strabismus. "my right eye is weak".  Rt. eye with exotropia.  Occulomotor ROM impaired to far rt. right eye:  Left eye: ROM with no abduction past midline when eye tested individually.  With both eyes tested together, pt. is able to track to Lt. Occulomotor Pursuits:  Pt loses  fixation to lt and to rt both inferior and superior quadrants.  Fields appear intact per confrontation testing.  Pt is able to to read Headlines in newspaper with Rt. eye, but unable to read smaller print (which he reports is baseline).  Pt able to read small newspaper print with Lt. eye, but misreads words frequently.  Pt endorses diplopa with distant vision which he reports is new.  Pt was instructed in HEP to increase occulomotor ROM and pursuits    Mobility  Bed Mobility: Not assessed Supine to Sit: 4: Min assist Sit to Supine: 4: Min assist    Transfers  Transfers: Sit to Stand;Stand to Sit Sit to Stand: 4: Min guard;With upper extremity assist;With armrests;From chair/3-in-1 Stand to Sit: 4: Min guard;With upper extremity assist;With armrests;To chair/3-in-1    Ambulation / Gait / Stairs / Emergency planning/management officer  Ambulation/Gait Ambulation/Gait Assistance: 4: Min guard Ambulation/Gait: Patient Percentage: 70% Ambulation Distance (Feet): 200 Feet Assistive device: Rolling walker Ambulation/Gait Assistance Details: Cues for body positioning inside RW, tall posture, & encouragement for decreased reliance of UE's on RW.   Gait Pattern: Step-through pattern;Decreased hip/knee flexion - right;Decreased stance time - left;Decreased step length - right;Decreased weight shift to left;Trunk flexed;Wide base of support;Lateral trunk lean to left (decreased floor clearance) Gait velocity: decreased Stairs: No Wheelchair Mobility Wheelchair Mobility: No    Posture / Balance Static Standing Balance Static Standing - Balance Support: No upper extremity supported;Right upper extremity supported;Left upper extremity supported Static Standing - Level of Assistance: 5: Stand by assistance;4: Min assist Static Standing - Comment/# of Minutes: Pt required min (A) for stability with tandem stance, Single leg stance.  Min Guard for standing with eyes open/closed, feet togther/apart, UE's straight out in  front, out to side, above head.  Perturbations at hips + shoulders in all directions.   Rhomberg - Eyes Opened: 30  (heels together, toes turned out) Rhomberg - Eyes Closed: 30  (heels together, toes turned out; slight incr sway) Dynamic Standing Balance Dynamic Standing - Balance Support: No upper extremity supported Dynamic Standing - Level of Assistance: Other (comment) (min guard assist) Dynamic Standing - Balance Activities:  (pulling up pants)    Special needs/care consideration  Continuous Drip IV IVF at 75 ml/hr Bowel mgmt:continent Bladder mgmt: urinal/continent    Previous Home Environment Living Arrangements: Alone Lives With: Alone Available Help at Discharge: Other (Comment) (sister and daily CNA) Type of Home: Apartment Home Layout: One level Home Access: Level entry Bathroom Shower/Tub: Chiropodist: Standard Bathroom Accessibility: Yes How Accessible: Accessible via walker Home Care Services: Yes Type of Home Care Services: Homehealth aide (pt has just changed agancy and does not reacall name of agen) Additional Comments:  (sister states she can come assist him daily as needed)  Discharge Living Setting Plans for Discharge Living Setting: Patient's home;Alone;Apartment Type of Home at Discharge: Apartment Discharge Home Layout: One  level Discharge Home Access: Level entry Discharge Bathroom Shower/Tub: Tub/shower unit Discharge Bathroom Toilet: Standard Do you have any problems obtaining your medications?: No  Social/Family/Support Systems Contact Information: [place] Single, sister Anticipated Caregiver: dailys CNA 3 to 4 hrs daily and then sister, [name] otherwise Anticipated Ambulance person Information: home [phone]; cell [phone] Ability/Limitations of Caregiver: supervision daily. Did not commit to 24/7 Caregiver Availability: Other (Comment) (sister did not commit to 24/7 supervision) Discharge Plan Discussed with  Primary Caregiver: Yes Is Caregiver In Agreement with Plan?: Yes Does Caregiver/Family have Issues with Lodging/Transportation while Pt is in Rehab?: No    Goals/Additional Needs Patient/Family Goal for Rehab: supervision PT and OT, n/a SLP Expected length of stay: ELOS 2 weeks Dietary Needs: Regular diet with thin Liquids   Decrease burden of Care through IP rehab admission: n/a   Possible need for SNF placement upon discharge:not anticipated   Patient Condition: Please see physician update to information in consult dated 02/24/2012.  Preadmission Screen Completed By:  Cleatrice Burke, 02/29/2012 2:26 PM ______________________________________________________________________   Discussed status with Dr. Naaman Plummer on 02/28/2013 at  1503 and received telephone approval for admission today.  Admission Coordinator:  Cleatrice Burke, time [id number] Date 02/29/2012.

## 2012-02-29 NOTE — H&P (Signed)
Physical Medicine and Rehabilitation Admission H&P  Chief Complaint   Patient presents with impaired balance and vision.   .    :  HPI: [name] [name] is a 57 y.o. right-handed male with history of hypertension, schizophrenia polysubstance abuse and hepatitis C. Patient lives alone in independent prior to admission. Admitted 02/20/2012 with altered mental status, left-sided numbness and slurred speech. MRI of the brain showed small acute cerebellar infarcts right greater than left as well as questionable acute left external capsule and caudate nuclear punctate lacunar infarct. Echocardiogram with ejection fraction of 30% with diffuse hypokinesis. Patient did not receive TPA. TEE completed showing PFO with right to left shunt, ejection fraction 30%. Lower extremity Dopplers showed no signs of DVT. Neurology services was consulted and presently maintained on aspirin therapy for stroke prophylaxis. Noted admission creatinine of 2.34 with latest check creatinine 1.2 02/08/2012 elevated to 9.54 and renal services has been consulted. Acute renal failure likely multi-factorial from ATN from hypotension. Placed on intravenous Lasix and monitored. Patient did not receive hemodialysis with latest creatinine 4.37 showing good improvement as he continues maintained on intravenous fluids and Lasix. Urine drug screen upon admission was negative noted alcohol level 105. Findings of elevated liver function studies upon admission was noted reports of nausea and vomiting over the past week prior to admission. Abdominal ultrasound showed dense slightly heterogeneous liver with morphologic suggestive of cirrhosis. Latest liver function panel 02/24/2012 with steady improvement. Followup cardiology services for findings of chronic systolic and diastolic heart failure with mild elevation in troponins along with rhabdomyolysis. 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 Not a candidate for ACE inhibitors or ARB due to 2  acute renal failure and advised to monitor. Patient is maintained on a regular consistency diet . Therapy evaluation completed 02/22/2012 noted patient needing some encouragement to participate also limited eval secondary to medical workup. Recommendations were made for physical medicine rehabilitation consult to consider inpatient rehabilitation . Patient was felt to be a good candidate for inpatient rehabilitation services and was admitted for a comprehensive rehabilitation program today.  Review of Systems  Gastrointestinal: Positive for nausea and vomiting.  Musculoskeletal: Positive for myalgias and back pain.  Neurological: Positive for speech change. Lower extremity edema. Blurred vision/diplopia  Psychiatric/Behavioral:  Schizophrenia  All other systems reviewed and are negative  Past Medical History   Diagnosis  Date   .  Hypertension    .  Schizophrenia    .  Hepatitis C    .  HIT (heparin-induced thrombocytopenia)    .  H/O: alcohol abuse    .  H/O cocaine abuse     Past Surgical History   Procedure  Date   .  Back surgery     Family History   Problem  Relation  Age of Onset   .  CAD  Mother    .  CAD  Father    .  CAD  Sister     Social History: reports that he has never smoked. He does not have any smokeless tobacco history on file. He reports that he drinks alcohol. He reports that he does not use illicit drugs.  Allergies: No Known Allergies  Medications Prior to Admission   Medication  Sig  Dispense  Refill   .  cloNIDine (CATAPRES) 0.3 MG tablet  Take 0.3 mg by mouth 2 (two) times daily.     .  furosemide (LASIX) 20 MG tablet  Take 20 mg by mouth daily.     Marland Kitchen  labetalol (NORMODYNE) 100 MG tablet  Take 100 mg by mouth 2 (two) times daily.     Marland Kitchen  lisinopril (PRINIVIL,ZESTRIL) 20 MG tablet  Take 20 mg by mouth daily.     .  traZODone (DESYREL) 100 MG tablet  Take 100 mg by mouth at bedtime.      Home:  Home Living  Lives With: Alone  Available Help at Discharge:  Family;Friend(s)  Type of Home: Apartment  Home Access: Level entry  Home Layout: One level  Home Adaptive Equipment: None  Additional Comments: states friend might be able to stay with him, not sure if 24 hour, sister checks in frequently when he is at home  Functional History:  Prior Function  Driving: No  Functional Status:  Mobility:  Bed Mobility  Bed Mobility: Not assessed  Supine to Sit: 4: Min assist  Sit to Supine: 4: Min assist  Transfers  Transfers: Sit to Stand;Stand to Sit  Sit to Stand: 4: Min guard;With upper extremity assist;With armrests;From chair/3-in-1  Stand to Sit: 4: Min assist;Without upper extremity assist;To chair/3-in-1  Ambulation/Gait  Ambulation/Gait Assistance: 4: Min guard  Ambulation/Gait: Patient Percentage: 70%  Ambulation Distance (Feet): 160 Feet  Assistive device: Rolling walker  Ambulation/Gait Assistance Details: still favoring LLE due to hip pain (reports he fell onto Lt side and laid there about 30 minutes not able to get up); vc for upright posture and safe use of RW  Gait Pattern: Step-to pattern;Decreased stride length;Wide base of support  Gait velocity: decreased  Stairs: No   ADL:  ADL  Lower Body Dressing: +1 Total assistance  Where Assessed - Lower Body Dressing: Supported sitting  Toilet Transfer: Minimal assistance  Toilet Transfer Method: Sit to stand  Toilet Transfer Equipment: Raised toilet seat with arms (or 3-in-1 over toilet)  Equipment Used: Gait belt (stedy)  Transfers/Ambulation Related to ADLs: Pt completed basic transfer pushing up with BIL UE from chair. pt requires elevated surface for transfers at this time. pt with delayed response using Lt UE but pt attempting without cues  ADL Comments: Pt sitting in chair on arrival. Pt educated on using stedy as a RW. Pt progressed to static standing a window looking at snow. Pt demonstrates ability to ambulate to 3n1 and sink level task  Cognition:  Cognition   Arousal/Alertness: Awake/alert  Orientation Level: Oriented X4  Cognition  Overall Cognitive Status: Appears within functional limits for tasks assessed/performed  Arousal/Alertness: Awake/alert  Orientation Level: Appears intact for tasks assessed  Behavior During Session: [facility] for tasks performed    Blood pressure 138/74, pulse 58, temperature 98.9 F (37.2 C), temperature source Oral, resp. rate 18, height 6' (1.829 m), weight 119.5 kg (263 lb 7.2 oz), SpO2 95.00%.  Physical Exam  Vitals reviewed.  HENT:  Head: Normocephalic.  Eyes:  Pupils are round and reactive to light  Neck: Neck supple. No thyromegaly present.  Cardiovascular: Normal rate and regular rhythm.  Pulmonary/Chest: Effort normal and breath sounds normal. No respiratory distress.  Abdominal: Soft. Bowel sounds are normal. He exhibits no distension. There is no tenderness. There is no rebound.  Neurological: He is alert.  Patient with flat affect but appropriate during exam. He was able remain person, place as well as age. He followed simple commands.  Motor strength is 5/5 in bilateral deltoid, biceps, triceps, grip, hip flexors, knee extensors, ankle dorsiflexor and plantar flexor  Sensory is intact to light touch in both upper and lower limbs  Finger nose is notable for  past pointing bilaterally.  Heel-to-shin test is normal. Gaze is dysconjugate with left eye limited in tracking in all fields. Right eye seems to stray to the right but with more consistent tracking.    Results for orders placed during the hospital encounter of 02/19/12 (from the past 48 hour(s))   GLUCOSE, CAPILLARY Status: Abnormal    Collection Time    02/27/12 12:01 PM   Component  Value  Range  Comment    Glucose-Capillary  121 (*)  70 - 99 mg/dL     Comment 1  Notify RN      Comment 2  Documented in Chart     GLUCOSE, CAPILLARY Status: Abnormal    Collection Time    02/27/12 5:00 PM   Component  Value  Range  Comment    Glucose-Capillary   134 (*)  70 - 99 mg/dL     Comment 1  Documented in Chart      Comment 2  Notify RN     GLUCOSE, CAPILLARY Status: Abnormal    Collection Time    02/27/12 9:50 PM   Component  Value  Range  Comment    Glucose-Capillary  119 (*)  70 - 99 mg/dL    GLUCOSE, CAPILLARY Status: Abnormal    Collection Time    02/28/12 6:56 AM   Component  Value  Range  Comment    Glucose-Capillary  123 (*)  70 - 99 mg/dL    RENAL FUNCTION PANEL Status: Abnormal    Collection Time    02/28/12 7:32 AM   Component  Value  Range  Comment    Sodium  136  135 - 145 mEq/L     Potassium  3.8  3.5 - 5.1 mEq/L     Chloride  100  96 - 112 mEq/L     CO2  23  19 - 32 mEq/L     Glucose, Bld  108 (*)  70 - 99 mg/dL     BUN  58 (*)  6 - 23 mg/dL     Creatinine, Ser  5.42 (*)  0.50 - 1.35 mg/dL     Calcium  8.5  8.4 - 10.5 mg/dL     Phosphorus  5.8 (*)  2.3 - 4.6 mg/dL     Albumin  3.0 (*)  3.5 - 5.2 g/dL     GFR calc non Af Amer  11 (*)  >90 mL/min     GFR calc Af Amer  12 (*)  >90 mL/min    HEPATIC FUNCTION PANEL Status: Abnormal    Collection Time    02/28/12 7:32 AM   Component  Value  Range  Comment    Total Protein  6.8  6.0 - 8.3 g/dL     Albumin  3.1 (*)  3.5 - 5.2 g/dL     AST  91 (*)  0 - 37 U/L     ALT  182 (*)  0 - 53 U/L     Alkaline Phosphatase  98  39 - 117 U/L     Total Bilirubin  0.5  0.3 - 1.2 mg/dL     Bilirubin, Direct  0.1  0.0 - 0.3 mg/dL     Indirect Bilirubin  0.4  0.3 - 0.9 mg/dL    CK Status: Abnormal    Collection Time    02/28/12 7:32 AM   Component  Value  Range  Comment    Total CK  4705 (*)  7 -  232 U/L    CBC Status: Normal    Collection Time    02/28/12 7:32 AM   Component  Value  Range  Comment    WBC  6.8  4.0 - 10.5 K/uL     RBC  4.31  4.22 - 5.81 MIL/uL     Hemoglobin  13.0  13.0 - 17.0 g/dL     HCT  39.7  39.0 - 52.0 %     MCV  92.1  78.0 - 100.0 fL     MCH  30.2  26.0 - 34.0 pg     MCHC  32.7  30.0 - 36.0 g/dL     RDW  14.5  11.5 - 15.5 %     Platelets  266  150 - 400  K/uL    MAGNESIUM Status: Normal    Collection Time    02/28/12 7:32 AM   Component  Value  Range  Comment    Magnesium  1.5  1.5 - 2.5 mg/dL    GLUCOSE, CAPILLARY Status: Normal    Collection Time    02/28/12 12:14 PM   Component  Value  Range  Comment    Glucose-Capillary  93  70 - 99 mg/dL     Comment 1  Documented in Chart      Comment 2  Notify RN     GLUCOSE, CAPILLARY Status: Abnormal    Collection Time    02/28/12 4:47 PM   Component  Value  Range  Comment    Glucose-Capillary  112 (*)  70 - 99 mg/dL     Comment 1  Documented in Chart      Comment 2  Notify RN     GLUCOSE, CAPILLARY Status: Abnormal    Collection Time    02/28/12 10:13 PM   Component  Value  Range  Comment    Glucose-Capillary  135 (*)  70 - 99 mg/dL     Comment 1  Documented in Chart      Comment 2  Notify RN     RENAL FUNCTION PANEL Status: Abnormal    Collection Time    02/29/12 7:00 AM   Component  Value  Range  Comment    Sodium  138  135 - 145 mEq/L     Potassium  3.9  3.5 - 5.1 mEq/L     Chloride  102  96 - 112 mEq/L     CO2  21  19 - 32 mEq/L     Glucose, Bld  117 (*)  70 - 99 mg/dL     BUN  54 (*)  6 - 23 mg/dL     Creatinine, Ser  4.37 (*)  0.50 - 1.35 mg/dL     Calcium  8.7  8.4 - 10.5 mg/dL     Phosphorus  4.8 (*)  2.3 - 4.6 mg/dL     Albumin  3.1 (*)  3.5 - 5.2 g/dL     GFR calc non Af Amer  14 (*)  >90 mL/min     GFR calc Af Amer  16 (*)  >90 mL/min    HEPATIC FUNCTION PANEL Status: Abnormal    Collection Time    02/29/12 7:00 AM   Component  Value  Range  Comment    Total Protein  6.8  6.0 - 8.3 g/dL     Albumin  3.1 (*)  3.5 - 5.2 g/dL     AST  72 (*)  0 - 37  U/L     ALT  143 (*)  0 - 53 U/L     Alkaline Phosphatase  89  39 - 117 U/L     Total Bilirubin  0.5  0.3 - 1.2 mg/dL     Bilirubin, Direct  0.1  0.0 - 0.3 mg/dL     Indirect Bilirubin  0.4  0.3 - 0.9 mg/dL    CK Status: Abnormal    Collection Time    02/29/12 7:00 AM   Component  Value  Range  Comment    Total CK  3673 (*)  7  - 232 U/L    CBC Status: Abnormal    Collection Time    02/29/12 7:00 AM   Component  Value  Range  Comment    WBC  7.0  4.0 - 10.5 K/uL     RBC  4.15 (*)  4.22 - 5.81 MIL/uL     Hemoglobin  12.6 (*)  13.0 - 17.0 g/dL     HCT  38.4 (*)  39.0 - 52.0 %     MCV  92.5  78.0 - 100.0 fL     MCH  30.4  26.0 - 34.0 pg     MCHC  32.8  30.0 - 36.0 g/dL     RDW  14.4  11.5 - 15.5 %     Platelets  279  150 - 400 K/uL    MAGNESIUM Status: Normal    Collection Time    02/29/12 7:00 AM   Component  Value  Range  Comment    Magnesium  1.6  1.5 - 2.5 mg/dL    GLUCOSE, CAPILLARY Status: Abnormal    Collection Time    02/29/12 7:25 AM   Component  Value  Range  Comment    Glucose-Capillary  118 (*)  70 - 99 mg/dL     Comment 1  Documented in Chart      Comment 2  Notify RN      Ct Head Wo Contrast  02/28/2012 *RADIOLOGY REPORT* Clinical Data: Left eye diplopia and headache, evaluate for hemorrhage CT HEAD WITHOUT CONTRAST Technique: Contiguous axial images were obtained from the base of the skull through the vertex without contrast. Comparison: MRI brain dated 02/20/2012 Findings: No evidence of parenchymal hemorrhage or extra-axial fluid collection. No mass lesion, mass effect, or midline shift. No CT evidence of acute infarction. Specifically, the foci of restricted diffusion noted on recent MRI are not visualized on CT. Subcortical white matter hypodensities, predominantly in the bilateral frontal lobes, left greater than right (series 2/image 20). This appearance likely reflects small vessel ischemic changes. Mild cortical atrophy. No ventriculomegaly. Mucosal thickening in the bilateral ethmoid and maxillary sinuses. The mastoid air cells are clear. No evidence of calvarial fracture. IMPRESSION: No evidence of intracranial hemorrhage. Foci of restricted effusion noted on recent MRI are not visualized on CT. Atrophy with small vessel ischemic changes. Original Report Authenticated By: Julian Hy, M.D.   Dg Chest Port 1 View  02/29/2012 *RADIOLOGY REPORT* Clinical Data: Possible edema PORTABLE CHEST - 1 VIEW Comparison: Portable chest x-ray of 02/18/2010 Findings: The lungs are not optimally aerated but no focal infiltrate or effusion is seen. Cardiomegaly is stable. No skeletal abnormality is seen. IMPRESSION: Stable cardiomegaly. No active lung disease. Original Report Authenticated By: Ivar Drape, M.D.   Post Admission Physician Evaluation:  1. Functional deficits secondary to embolic bicerebral infarcts. 2. Patient is admitted to receive collaborative, interdisciplinary care between the physiatrist, rehab nursing  staff, and therapy team. 3. Patient's level of medical complexity and substantial therapy needs in context of that medical necessity cannot be provided at a lesser intensity of care such as a SNF. 4. Patient has experienced substantial functional loss from his/her baseline which was documented above under the "Functional History" and "Functional Status" headings. Judging by the patient's diagnosis, physical exam, and functional history, the patient has potential for functional progress which will result in measurable gains while on inpatient rehab. These gains will be of substantial and practical use upon discharge in facilitating mobility and self-care at the household level. 5. Physiatrist will provide 24 hour management of medical needs as well as oversight of the therapy plan/treatment and provide guidance as appropriate regarding the interaction of the two. 6. 24 hour rehab nursing will assist with bladder management, bowel management, safety, skin/wound care, disease management, medication administration, pain management and patient education and help integrate therapy concepts, techniques,education, etc. 7. PT will assess and treat for: Lower extremity strength, range of motion, stamina, balance, functional mobility, safety, adaptive techniques and equipment, NMR, visual-spatial  deficits, education. Goals are: mod I. 8. OT will assess and treat for: ADL's, functional mobility, safety, upper extremity strength, adaptive techniques and equipment, NMR, visual-spatial deficits. Goals are: mod I. 9. SLP will assess and treat for: n/a. Goals are: n/a. 10. Case Management and Social Worker will assess and treat for psychological issues and discharge planning. 11. Team conference will be held weekly to assess progress toward goals and to determine barriers to discharge. 12. Patient will receive at least 3 hours of therapy per day at least 5 days per week. 13. ELOS 7-10 days Prognosis: excellent   Medical Problem List and Plan:  1. Embolic bilateral cerebellar infarcts  2. DVT Prophylaxis/Anticoagulation: Subcutaneous heparin. Monitor platelet counts and any signs of bleeding  3. Mood/schizophrenia. Provide emotional support and positive reinforcement. Patient states he takes Abilify prior to admission although this is not documented in the old records. We'll discuss further with patient  4. Neuropsych: This patient is capable of making decisions on his/her own behalf.  5. Acute renal failure in setting of rhabdomyolysis. Felt to be multi-factorial from ATN/hypotension. Currently remains on IV fluids 75 cc an hour and Lasix to be changed to 40 mg by mouth twice a day. Weight patient daily followup chemistries with latest creatinine 4.37. Renal services following  6. Chronic systolic and diastolic heart failure. Latest chest x-ray no active disease. Continue Lasix therapy. Followup per cardiology services as needed  7. Hypertension. Coreg 6.25 mg twice a day, Bidil 20-37.5 mg 3 times a day. Monitor with increased activity  8. Alcohol/polysubstance abuse. Monitor for signs of withdrawal and provide counseling    02/29/2012  Meredith Staggers, MD, [facility]

## 2012-02-29 NOTE — Progress Notes (Signed)
I have insurance approval and will admit pt to inpt rehab today. [id number]

## 2012-02-29 NOTE — Progress Notes (Signed)
Occupational Therapy Treatment Patient Details Name: [name] [name] MRN: [id number] DOB: Apr 18, 1955 Today's Date: 02/29/2012 Time: 1040-1106 OT Time Calculation (min): 26 min  OT Assessment / Plan / Recommendation Comments on Treatment Session Pt progressing with ADLs.  Pt. with complaint of diplopia, blurriness, and dizziness with transitional movments.  Recommend CIR to allow  pt to achieve modified independent level to allow him to return home with intermittent supervision    Follow Up Recommendations  CIR    Barriers to Discharge       Equipment Recommendations  3 in 1 bedside comode    Recommendations for Other Services Rehab consult  Frequency Min 3X/week   Plan Discharge plan remains appropriate    Precautions / Restrictions Precautions Precautions: Fall Restrictions Weight Bearing Restrictions: No   Pertinent Vitals/Pain     ADL  Eating/Feeding: Independent;Modified independent Where Assessed - Eating/Feeding: Chair Grooming: Wash/dry hands;Wash/dry face;Teeth care;Brushing hair;Minimal assistance Where Assessed - Grooming: Supported standing Upper Body Bathing: Supervision/safety Where Assessed - Upper Body Bathing: Supported sitting Lower Body Bathing: Minimal assistance Where Assessed - Lower Body Bathing: Supported sit to stand Upper Body Dressing: Minimal assistance Where Assessed - Upper Body Dressing: Unsupported sitting Lower Body Dressing: Moderate assistance Where Assessed - Lower Body Dressing: Supported sit to Lobbyist: Minimal assistance Armed forces technical officer Method: Sit to Loss adjuster, chartered: Comfort height toilet Toileting - Clothing Manipulation and Hygiene: Minimal assistance Where Assessed - Toileting Clothing Manipulation and Hygiene: Standing Transfers/Ambulation Related to ADLs: Pt requires min A to ambulate into BR with RW ADL Comments: Pt. with difficulty accessing feet/LEs for LB ADLs.  Balance minimally impaired.  Pt  reports dizziness when standing and ambulating.  Instructed him to stabilize gaze when moving.  VISION:  Pt with a congenital strabismus. "my right eye is weak".  Rt. eye with exotropia.  Occulomotor ROM impaired to far rt. right eye:  Left eye: ROM with no abduction past midline when eye tested individually.  With both eyes tested together, pt. is able to track to Lt. Occulomotor Pursuits:  Pt loses fixation to lt and to rt both inferior and superior quadrants.  Fields appear intact per confrontation testing.  Pt is able to to read Headlines in newspaper with Rt. eye, but unable to read smaller print (which he reports is baseline).  Pt able to read small newspaper print with Lt. eye, but misreads words frequently.  Pt endorses diplopa with distant vision which he reports is new.  Pt was instructed in HEP to increase occulomotor ROM and pursuits    OT Diagnosis:    OT Problem List:   OT Treatment Interventions:     OT Goals ADL Goals ADL Goal: Grooming - Progress: Progressing toward goals ADL Goal: Upper Body Bathing - Progress: Progressing toward goals ADL Goal: Upper Body Dressing - Progress: Progressing toward goals ADL Goal: Toilet Transfer - Progress: Progressing toward goals ADL Goal: Toileting - Clothing Manipulation - Progress: Progressing toward goals Miscellaneous OT Goals OT Goal: Miscellaneous Goal #1 - Progress: Progressing toward goals  Visit Information  Last OT Received On: 02/29/12 Assistance Needed: +1    Subjective Data      Prior Functioning  Home Living Available Help at Discharge: Other (Comment) (sister and daily CNA) Bathroom Shower/Tub: Chiropodist: Standard Bathroom Accessibility: Yes How Accessible: Accessible via walker Additional Comments:  (sister states she can come assist him daily as needed) Prior Function Able to Take Stairs?: Yes Vocation: On disability    Cognition  Cognition Overall Cognitive Status: Appears within  functional limits for tasks assessed/performed Arousal/Alertness: Awake/alert Orientation Level: Appears intact for tasks assessed Behavior During Session: [facility] for tasks performed    Mobility  Bed Mobility Bed Mobility: Not assessed Transfers Transfers: Sit to Stand;Stand to Sit Sit to Stand: 4: Min guard;With upper extremity assist;With armrests;From chair/3-in-1 Stand to Sit: 4: Min guard;With upper extremity assist;With armrests;To chair/3-in-1 Details for Transfer Assistance: min guard due to impaired balance    Exercises  General Exercises - Lower Extremity Long Arc Quad: Both;10 reps Hip Flexion/Marching: AROM;Strengthening;10 reps;Both;Seated;Standing Toe Raises: AROM;Strengthening;Both;10 reps Heel Raises: AROM;Strengthening;Both;10 reps   Balance Balance Balance Assessed: Yes Static Standing Balance Static Standing - Balance Support: No upper extremity supported;Right upper extremity supported;Left upper extremity supported Static Standing - Level of Assistance: 5: Stand by assistance;4: Min assist Static Standing - Comment/# of Minutes: Pt required min (A) for stability with tandem stance, Single leg stance.  Min Guard for standing with eyes open/closed, feet togther/apart, UE's straight out in front, out to side, above head.  Perturbations at hips + shoulders in all directions.   Dynamic Standing Balance Dynamic Standing - Balance Support: No upper extremity supported Dynamic Standing - Level of Assistance: Other (comment) (min guard assist) Dynamic Standing - Balance Activities:  (pulling up pants)   End of Session OT - End of Session Activity Tolerance: Patient tolerated treatment well Patient left: in chair;with call bell/phone within reach Nurse Communication: Other (comment) (edema LEs)  GO     [name] [name] M 02/29/2012, 11:29 AM

## 2012-03-01 ENCOUNTER — Inpatient Hospital Stay (HOSPITAL_COMMUNITY): Payer: PRIVATE HEALTH INSURANCE | Admitting: Occupational Therapy

## 2012-03-01 ENCOUNTER — Inpatient Hospital Stay (HOSPITAL_COMMUNITY): Payer: PRIVATE HEALTH INSURANCE | Admitting: *Deleted

## 2012-03-01 DIAGNOSIS — I6340 Cerebral infarction due to embolism of unspecified cerebral artery: Secondary | ICD-10-CM

## 2012-03-01 DIAGNOSIS — I10 Essential (primary) hypertension: Secondary | ICD-10-CM

## 2012-03-01 LAB — CBC WITH DIFFERENTIAL/PLATELET
Eosinophils Relative: 5 % (ref 0–5)
HCT: 39.4 % (ref 39.0–52.0)
Hemoglobin: 12.8 g/dL — ABNORMAL LOW (ref 13.0–17.0)
Lymphocytes Relative: 19 % (ref 12–46)
Lymphs Abs: 1.4 10*3/uL (ref 0.7–4.0)
MCV: 92.9 fL (ref 78.0–100.0)
Monocytes Relative: 11 % (ref 3–12)
Platelets: 294 10*3/uL (ref 150–400)
RBC: 4.24 MIL/uL (ref 4.22–5.81)
WBC: 7.3 10*3/uL (ref 4.0–10.5)

## 2012-03-01 LAB — COMPREHENSIVE METABOLIC PANEL
ALT: 140 U/L — ABNORMAL HIGH (ref 0–53)
Alkaline Phosphatase: 144 U/L — ABNORMAL HIGH (ref 39–117)
CO2: 21 mEq/L (ref 19–32)
Calcium: 9.1 mg/dL (ref 8.4–10.5)
GFR calc Af Amer: 25 mL/min — ABNORMAL LOW (ref >90)
GFR calc non Af Amer: 22 mL/min — ABNORMAL LOW (ref >90)
Glucose, Bld: 94 mg/dL (ref 70–99)
Sodium: 137 mEq/L (ref 135–145)

## 2012-03-01 MED ORDER — SENNOSIDES-DOCUSATE SODIUM 8.6-50 MG PO TABS
2.0000 | ORAL_TABLET | Freq: Two times a day (BID) | ORAL | Status: DC
  Administered 2012-03-01 – 2012-03-08 (×14): 2 via ORAL
  Filled 2012-03-01 (×16): qty 2

## 2012-03-01 MED ORDER — CALCIUM CARBONATE ANTACID 500 MG PO CHEW
400.0000 mg | CHEWABLE_TABLET | Freq: Three times a day (TID) | ORAL | Status: DC
  Filled 2012-03-01 (×2): qty 2

## 2012-03-01 MED ORDER — CALCIUM CARBONATE ANTACID 500 MG PO CHEW
200.0000 mg | CHEWABLE_TABLET | Freq: Three times a day (TID) | ORAL | Status: DC
  Administered 2012-03-02 – 2012-03-08 (×15): 200 mg via ORAL
  Filled 2012-03-01 (×23): qty 1

## 2012-03-01 MED ORDER — HYDROCERIN EX CREA
TOPICAL_CREAM | Freq: Two times a day (BID) | CUTANEOUS | Status: DC
  Administered 2012-03-01 – 2012-03-08 (×14): via TOPICAL
  Filled 2012-03-01: qty 113

## 2012-03-01 NOTE — Evaluation (Signed)
Physical Therapy Assessment and Plan  Patient Details  Name: [name] [name] MRN: [id number] Date of Birth: 11-Feb-1955  PT Diagnosis: Abnormal posture, Abnormality of gait, Coordination disorder, Dizziness and giddiness, Hemiplegia non-dominant, Impaired sensation and Muscle weakness Rehab Potential: Excellent ELOS: 7-10 days   Today's Date: 03/01/2012 Time: 1100-1200 Time Calculation (min): 60 min  Problem List:  Patient Active Problem List  Diagnosis  . Altered mental status  . Schizophrenia  . HTN (hypertension)  . Hepatitis C  . Cerebellar infarct  . Acute renal failure (ARF)  . Transaminitis  . Rhabdomyolysis  . Thrombocytopenia  . Increased anion gap metabolic acidosis  . Alcohol abuse  . CHF (congestive heart failure) EF 30-35%  . CVA (cerebral infarction)    Past Medical History:  Past Medical History  Diagnosis Date  . Hypertension   . Schizophrenia   . Hepatitis C   . HIT (heparin-induced thrombocytopenia)   . H/O: alcohol abuse   . H/O cocaine abuse   . Stroke    Past Surgical History:  Past Surgical History  Procedure Date  . Back surgery     Assessment & Plan Clinical Impression: [name] [name] is a 57 y.o. right-handed male with history of hypertension, schizophrenia polysubstance abuse and hepatitis C. Admitted 02/20/2012 with altered mental status, left-sided numbness and slurred speech. MRI of the brain showed small acute cerebellar infarcts right greater than left as well as questionable acute left external capsule and caudate nuclear punctate lacunar infarct. Echocardiogram with ejection fraction of 30% with diffuse hypokinesis. Patient did not receive TPA. TEE completed showing PFO with right to left shunt, ejection fraction 30%. Lower extremity Dopplers showed no signs of DVT. Neurology services was consulted and presently maintained on aspirin therapy for stroke prophylaxis. Noted admission creatinine of 2.34 with latest check creatinine 1.2  02/08/2012 elevated to 9.54 and renal services has been consulted. Acute renal failure likely multi-factorial from ATN from hypotension. Placed on intravenous Lasix and monitored. Patient did not receive hemodialysis with latest creatinine 4.37 showing good improvement as he continues maintained on intravenous fluids and Lasix. Urine drug screen upon admission was negative noted alcohol level 105. Findings of elevated liver function studies upon admission was noted reports of nausea and vomiting over the past week prior to admission. Abdominal ultrasound showed dense slightly heterogeneous liver with morphologic suggestive of cirrhosis. Latest liver function panel 02/24/2012 with steady improvement. Followup cardiology services for findings of chronic systolic and diastolic heart failure with mild elevation in troponins along with rhabdomyolysis. 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 Not a candidate for ACE inhibitors or ARB due to 2 acute renal failure and advised to monitor. Patient is maintained on a regular consistency diet. Recommendations were made for physical medicine rehabilitation consult to consider inpatient rehabilitation .Patient was felt to be a good candidate for inpatient rehabilitation services and was admitted for a comprehensive rehabilitation program today. Patient transferred to CIR on 02/29/2012 .   Patient currently requires min with mobility secondary to muscle weakness, decreased cardiorespiratoy endurance, impaired timing and sequencing, decreased coordination and decreased motor planning, decreased visual acuity and decreased visual motor skills, decreased attention to left, diplopia, blurred vision, eye muscle fatigue and decreased standing balance, decreased postural control, hemiplegia and decreased balance strategies.  Prior to hospitalization, patient was independent  with mobility and lived with Alone in a [facility] home.  Home access is  Level entry.  Patient  will benefit from skilled PT intervention to maximize safe functional mobility, minimize fall risk and  decrease caregiver burden for planned discharge home with intermittent assist.  Anticipate patient will benefit from follow up [place] at discharge.  PT - End of Session Activity Tolerance: Tolerates 30+ min activity without fatigue Endurance Deficit: No PT Assessment Rehab Potential: Excellent Barriers to Discharge: Decreased caregiver support Barriers to Discharge Comments: Patient likely to have only intermittent assist available upon d/c PT Plan PT Intensity: Minimum of 1-2 x/day ,45 to 90 minutes PT Frequency: 5 out of 7 days PT Duration Estimated Length of Stay: 7-10 days PT Treatment/Interventions: Ambulation/gait training;Balance/vestibular training;Cognitive remediation/compensation;Community reintegration;DME/adaptive equipment instruction;Discharge planning;Functional mobility training;Neuromuscular re-education;Pain management;Patient/family education;Psychosocial support;Therapeutic Exercise;Visual/perceptual remediation/compensation;Therapeutic Activities;Stair training;UE/LE Coordination activities;UE/LE Strength taining/ROM;Wheelchair propulsion/positioning PT Recommendation Recommendations for Other Services: Speech consult;Vestibular eval Follow Up Recommendations: Home health PT Patient destination: Home Equipment Recommended: Quad cane;Rolling walker with 5" wheels Equipment Details: DME assessment ongoing, TBD closer to d/c  Skilled Therapeutic Intervention   PT Evaluation Precautions/Restrictions Precautions Precautions: Fall Precaution Comments: visual deficits (sometimes double vision) Restrictions Weight Bearing Restrictions: No General Chart Reviewed: Yes  Vital SignsTherapy Vitals Pulse Rate: 61  Patient Position, if appropriate: Sitting Oxygen Therapy SpO2: 97 % O2 Device: None (Room air) Pulse Oximetry Type: Intermittent Pain Pain Assessment Pain  Assessment: No/denies pain Pain Score: 0-No pain Home Living/Prior Functioning Home Living Lives With: Alone Available Help at Discharge: Available PRN/intermittently;Other (Comment);Home health;Family (sister comes by daily and daily CNA) Type of Home: Apartment Home Access: Level entry Home Layout: One level Bathroom Shower/Tub: Chiropodist: Standard Bathroom Accessibility: Yes How Accessible: Accessible via walker Home Adaptive Equipment: None Prior Function Level of Independence: Independent with transfers;Independent with gait;Needs assistance with homemaking;Other (comment) (CNA assists with cooking and cleaning) Able to Take Stairs?: Yes Driving: No Vocation: On disability Vision/Perception  Vision - History Baseline Vision: Other (comment) (reports R eye weakness PTA; has glasses but doesn't wear) Patient Visual Report: Blurring of vision;Diplopia;Eye fatigue/eye pain/headache;Nausea/blurring vision with head movement  Cognition Overall Cognitive Status: Appears within functional limits for tasks assessed Arousal/Alertness: Lethargic Orientation Level: Oriented X4 Sensation Sensation Light Touch: Appears Intact Proprioception: Appears Intact Additional Comments: Patient reports grossly decreased sensation on L LE as compared to R, but L LE sensation is intact with formal testing. Proprioception intact at B great toe and ankles. Coordination Gross Motor Movements are Fluid and Coordinated: No Fine Motor Movements are Fluid and Coordinated: No Coordination and Movement Description: Decreased speed and accuracy with rapid, alternating movements. Heel Shin Test: Slight dysmetria/undershooting noted L LE Motor  Motor Motor: Abnormal postural alignment and control;Hemiplegia Motor - Skilled Clinical Observations: No clonus noted B LE (gastroc and soleus); Very slight hemiplegia noted L LE.  Mobility Bed Mobility Bed Mobility: Not  assessed Transfers Sit to Stand: 4: Min guard;From chair/3-in-1;Without upper extremity assist;With armrests Stand to Sit: 4: Min guard;Without upper extremity assist;With armrests;To chair/3-in-1 Locomotion  Ambulation Ambulation: Yes Ambulation/Gait Assistance: 4: Min guard Ambulation Distance (Feet): 130 Feet x2 Assistive device: None Ambulation/Gait Assistance Details: Patient requires verbal cues for upright posture and increasing step length. Gait Gait: Yes Gait Pattern: Impaired Gait Pattern: Step-through pattern;Decreased step length - left;Decreased step length - right;Decreased stance time - left;Decreased stride length;Wide base of support;Trunk flexed;Lateral trunk lean to left;Decreased weight shift to left Gait velocity: decreased gait speed Stairs / Additional Locomotion Stairs: Yes Stair Management Technique: Two rails;Forwards;Alternating pattern;Step to pattern (alternating pattern ascending, step to pattern descending) Number of Stairs: 5  Height of Stairs: 6  Wheelchair Mobility Wheelchair Mobility: No Distance: 0  Trunk/Postural Assessment  Cervical Assessment Cervical Assessment: Within Functional Limits Thoracic Assessment Thoracic Assessment: Within Functional Limits Lumbar Assessment Lumbar Assessment: Within Functional Limits Postural Control Postural Control: Deficits on evaluation Postural Limitations: Sits with L trunk shortened  Balance Balance Balance Assessed: Yes Standardized Balance Assessment Standardized Balance Assessment: Berg Balance Test Berg Balance Test Sit to Stand: Able to stand without using hands and stabilize independently Standing Unsupported: Able to stand 2 minutes with supervision Sitting with Back Unsupported but Feet Supported on Floor or Stool: Able to sit safely and securely 2 minutes Stand to Sit: Sits safely with minimal use of hands Transfers: Able to transfer safely, definite need of hands Standing Unsupported  with Eyes Closed: Able to stand 10 seconds with supervision Standing Ubsupported with Feet Together: Able to place feet together independently and stand for 1 minute with supervision From Standing, Reach Forward with Outstretched Arm: Can reach forward >5 cm safely (2") From Standing Position, Pick up Object from Floor: Able to pick up shoe, needs supervision From Standing Position, Turn to Look Behind Over each Shoulder: Turn sideways only but maintains balance Turn 360 Degrees: Able to turn 360 degrees safely but slowly Standing Unsupported, Alternately Place Feet on Step/Stool: Able to stand independently and safely and complete 8 steps in 20 seconds Standing Unsupported, One Foot in Front: Able to plae foot ahead of the other independently and hold 30 seconds Standing on One Leg: Tries to lift leg/unable to hold 3 seconds but remains standing independently Total Score: 41/56, indicating patient is at a significant risk for falls (>80%)  Static Standing Balance Static Standing - Balance Support: No upper extremity supported;During functional activity Static Standing - Level of Assistance: 5: Stand by assistance;4: Min assist Static Standing - Comment/# of Minutes: stand by- min guard required during static standing to urinate and then to stand at sink to wash hands. Extremity Assessment  RLE Assessment RLE Assessment: Within Functional Limits (Grossly 5/5 strength) LLE Assessment LLE Assessment: Exceptions to [facility] LLE Strength LLE Overall Strength: Within Functional Limits for tasks assessed;Deficits LLE Overall Strength Comments: Grossly WFL with 5/5 strenth, 4/5 hip flexion, ankle DF/PF  FIM:  FIM - Bed/Chair Transfer Bed/Chair Transfer Assistive Devices: Arm rests Bed/Chair Transfer: 4: Bed > Chair or W/C: Min A (steadying Pt. > 75%);4: Chair or W/C > Bed: Min A (steadying Pt. > 75%) FIM - Locomotion: Wheelchair Distance: 0 Locomotion: Wheelchair: 0: Activity did not occur FIM -  Locomotion: Ambulation Ambulation/Gait Assistance: 4: Min guard Locomotion: Ambulation: 2: Travels 50 - 149 ft with minimal assistance (Pt.>75%) FIM - Locomotion: Stairs Locomotion: Scientist, physiological: Hand rail - 2 Locomotion: Stairs: 2: Up and Down 4 - 11 stairs with minimal assistance (Pt.>75%)   Treatment initiated after evaluation. Patient instructed in gait training x75' with [facility] and min guard. Patient demonstrates good sequencing and technique with cane, but does not appear to use it for balance. Will trial SPC during this afternoon's treatment session.  Refer to Care Plan for Long Term Goals  Recommendations for other services: Other: vestibular and speech eval  Discharge Criteria: Patient will be discharged from PT if patient refuses treatment 3 consecutive times without medical reason, if treatment goals not met, if there is a change in medical status, if patient makes no progress towards goals or if patient is discharged from hospital.  The above assessment, treatment plan, treatment alternatives and goals were discussed and mutually agreed upon: by patient  Lillia Abed. Braylea Brancato, PT, DPT  03/01/2012, 12:06 PM

## 2012-03-01 NOTE — Care Management Note (Signed)
Inpatient [facility] Individual Statement of Services  Patient Name:  [name] [name]  Date:  03/01/2012  Welcome to the [facility].  Our goal is to provide you with an individualized program based on your diagnosis and situation, designed to meet your specific needs.  With this comprehensive rehabilitation program, you will be expected to participate in at least 3 hours of rehabilitation therapies Monday-Friday, with modified therapy programming on the weekends.  Your rehabilitation program will include the following services:  Physical Therapy (PT), Occupational Therapy (OT), Speech Therapy (ST), 24 hour per day rehabilitation nursing, Therapeutic Recreaction (TR), Case Management ( Social Worker), Rehabilitation Medicine, Nutrition Services and Pharmacy Services  Weekly team conferences will be held on Wednesday to discuss your progress.  Your  Social Worker will talk with you frequently to get your input and to update you on team discussions.  Team conferences with you and your family in attendance may also be held.  Expected length of stay: 7-10 days Overall anticipated outcome: mod/i level  Depending on your progress and recovery, your program may change.  Your  Social Worker will coordinate services and will keep you informed of any changes.  Your  SW name and contact number are listed  below.  The following services may also be recommended but are not provided by the [facility]:  [place] will be made to provide these services after discharge if needed.  Arrangements include referral to agencies that provide these services.  Your insurance has been verified to be:  [place] Your primary doctor is:  [facility]  Pertinent information will be shared with your doctor and your insurance company.   Social Worker:  Ovidio Kin, [place]  Information discussed with and copy given to patient by: Elease Hashimoto, 03/01/2012, 10:09 AM

## 2012-03-01 NOTE — Care Management Note (Signed)
    Page 1 of 1   03/01/2012     8:23:02 AM   CARE MANAGEMENT NOTE 03/01/2012  Patient:  [name] [name], [name] [name]   Account Number:  [id number]  Date Initiated:  02/22/2012  Documentation initiated by:  Marvetta Gibbons  Subjective/Objective Assessment:   Pt admitted with AMS- MRI + for acute CVA     Action/Plan:   PTA pt lived at home with roommate- PT/OT evals pending- NCM to follow for d/c needs/planning   Anticipated DC Date:  02/29/2012   Anticipated DC Plan:  [place]  CM consult      Choice offered to / List presented to:             Status of service:  Completed, signed off Medicare Important Message given?   (If response is "NO", the following Medicare IM given date fields will be blank) Date Medicare IM given:   Date Additional Medicare IM given:    Discharge Disposition:  IP REHAB FACILITY  Per UR Regulation:  Reviewed for med. necessity/level of care/duration of stay  If discussed at [place] of Stay Meetings, dates discussed:   02/25/2012    Comments:  02/25/12- 1200- Marvetta Gibbons RN, BSN [phone] Pt for admit to CIR when renal function stable- cont. IV boluses and bicarb at this time, IV lasix decreased to BID  02/23/12- 1200- Marvetta Gibbons RN BSN [phone] CIR following - will f/u on consult for CIR- PT/OT eval

## 2012-03-01 NOTE — Progress Notes (Signed)
Physical Therapy Session Note  Patient Details  Name: Allistair Longbrake MRN: [id number] Date of Birth: 27-Feb-1955  Today's Date: 03/01/2012 Time: 1430-1500 Time Calculation (min): 30 min  Short Term Goals: Week 1:  PT Short Term Goal 1 (Week 1): STGs=LTGs secondary to short LOS  Skilled Therapeutic Interventions/Progress Updates:  Today's session focused on gait training with and without AD, car transfers, and floor transfers. Patient performed car transfer with min guard (able to negotiate door). Patient stood on R LE to get into car. Patient educated and PT demonstrated other technique for car transfer to stand, turn back to car seat and then sit and transfer B LEs into car. Patient return demonstrated with min guard.   Patient educated about falls risk at home and indications for calling EMS vs. Indications for attempting floor transfer. Patient verbalized understanding. Patient performed floor transfer: standing>half kneeling with B UE support on couch> tall kneeling with B UE on couch> L side sitting> L sidelying> supine> L sidelying> prone>quadruped > tall kneeling with B UE on couch> half kneeling B UE on couch> standing.  Patient instructed in gait training with SPC x75' with min guard. Patient demonstrates proper sequencing and technique with SPC, but does not utilize for balance. Will focus gait training without AD, unless change in functional status.  Patient left seated in recliner with all needs within reach. Patient able to state he must call for assistance if he needs to get up. Patient encouraged to call for assistance and patient verbalized understanding.   Therapy Documentation Precautions:  Precautions Precautions: Fall Precaution Comments: diplopia, visual deficits  Restrictions Weight Bearing Restrictions: No Pain: Pain Assessment Pain Assessment: 0-10 Pain Score:   6 Pain Type: Acute pain Pain Location: Head Pain Descriptors: Headache Pain Intervention(s):  Medication (See eMAR) Mobility: Transfers Sit to Stand: 4: Min guard;Without upper extremity assist Stand to Sit: 4: Min guard;Without upper extremity assist Locomotion : Ambulation Ambulation: Yes Ambulation/Gait Assistance: 4: Min guard Ambulation Distance (Feet): 115 Feet, 66' with SPC  Assistive device: None, SPC Ambulation/Gait Assistance Details: Patient continues to require verbal cues for upright posture and increasing step length, as well as to look forward (not at floor) during gait training. Gait Gait: Yes Gait Pattern: Impaired Gait Pattern: Step-through pattern;Decreased step length - left;Decreased stance time - left;Decreased stride length;Trunk flexed;Wide base of support;Decreased weight shift to left;Lateral trunk lean to left Wheelchair Mobility Wheelchair Mobility: No Distance: 0   See FIM for current functional status  Therapy/Group: Individual Therapy  Lillia Abed. Nance Mccombs, PT, DPT  03/01/2012, 4:08 PM

## 2012-03-01 NOTE — Progress Notes (Signed)
Overall Plan of Care [facility]) Patient Details Name: [name] [name] MRN: [id number] DOB: [birth year]  Diagnosis:  Rehabilitation for bilateral cerebellar infarcts  Co-morbidities: ARF,Hypertension  .  Schizophrenia  .  Hepatitis C  .  HIT (heparin-induced thrombocytopenia)  .  H/O: alcohol abuse  .  H/O cocaine abuse    Functional Problem List  Patient demonstrates impairments in the following areas: Balance, Bladder, Bowel, Edema, Endurance, Medication Management, Pain, Safety and Vision  Basic ADL's: grooming, bathing, dressing and toileting Advanced ADL's: simple meal preparation, laundry and light housekeeping  Transfers:  bed mobility, bed to chair, toilet, tub/shower, car, furniture and floor Locomotion:  ambulation and stairs  Additional Impairments:  Functional use of upper extremity and Discharge Disposition  Anticipated Outcomes Item Anticipated Outcome  Eating/Swallowing    Basic self-care  Modified independent  Tolieting  Modified independent  Bowel/Bladder  Will remain continent of bowel and bladder.  Transfers   Mod I  Locomotion  Mod I x250' ambulation, 12 stairs for community access  Communication    Cognition    Pain  3 or less  Safety/Judgment    Other     Therapy Plan: PT Intensity: Minimum of 1-2 x/day ,45 to 90 minutes PT Frequency: 5 out of 7 days PT Duration Estimated Length of Stay: 7-10 days OT Intensity: Minimum of 1-2 x/day, 45 to 90 minutes OT Frequency: 5 out of 7 days OT Duration/Estimated Length of Stay: 7 days      Team Interventions: Item RN PT OT SLP SW TR Other  Self Care/Advanced ADL Retraining   x      Neuromuscular Re-Education  x x      Therapeutic Activities  x x      UE/LE Strength Training/ROM  x x      UE/LE Coordination Activities  x x      Visual/Perceptual Remediation/Compensation  x x      DME/Adaptive Equipment Instruction  x x      Therapeutic Exercise  x x      Balance/Vestibular Training  x x       Patient/Family Education  x x      Cognitive Remediation/Compensation  x       Functional Mobility Training  x x      Ambulation/Gait Training  x       IT trainer  x       Wheelchair Propulsion/Positioning  x       Functional IT sales professional Reintegration  x x      Dysphagia/Aspiration Environmental consultant         Bladder Management x        Bowel Management x        Disease Management/Prevention x        Pain Management x x       Medication Management x        Skin Care/Wound Management x        Splinting/Orthotics         Discharge Planning  x x      Psychosocial Support x x x                          Team Discharge Planning: Destination: PT-Home ,OT- Assisted Living , SLP-  Projected Follow-up: PT-Home health PT, OT-  Home health OT, SLP-  Projected Equipment Needs:  PT-Quad cane;Rolling walker with 5" wheels, OT- Tub/shower seat, SLP-  Patient/family involved in discharge planning: PT- Patient,  OT-Patient, SLP-   MD ELOS: 2 weeks Medical Rehab Prognosis:  Good Assessment: 57 yo male admitted for multi infarct CVA, cerebellar and subcortical CVA, now requiring 24/7 rehab RN/MD, CIR level PT/OT SLP.  Team to focus on balance, ADLs, cognitive/perceptual, vestibular, safety , equipment, goals of minA mobility, Sup ADL    See Team Conference Notes for weekly updates to the plan of care

## 2012-03-01 NOTE — Progress Notes (Signed)
Patient ID: [name] [name], male   DOB: [birth year], 57 y.o.   MRN: [id number]  Subjective/Complaints: 57 y.o. right-handed male with history of hypertension, schizophrenia polysubstance abuse and hepatitis C. Patient lives alone in independent prior to admission. Admitted 02/20/2012 with altered mental status, left-sided numbness and slurred speech. MRI of the brain showed small acute cerebellar infarcts right greater than left as well as questionable acute left external capsule and caudate nuclear punctate lacunar infarct. Echocardiogram with ejection fraction of 30% with diffuse hypokinesis. Patient did not receive TPA. TEE completed showing PFO with right to left shunt, ejection fraction 30%. Lower extremity Dopplers showed no signs of DVT. Neurology services was consulted and presently maintained on aspirin therapy for stroke prophylaxis. Noted admission creatinine of 2.34 with latest check creatinine 1.2 02/08/2012 elevated to 9.54 and renal services has been consulted. Acute renal failure likely multi-factorial from ATN from hypotension. Placed on intravenous Lasix and monitored. Patient did not receive hemodialysis with latest creatinine 4.37 showing good improvement as he continues maintained on intravenous fluids and Lasix. Urine drug screen upon admission was negative noted alcohol level 105. Findings of elevated liver function studies upon admission was noted reports of nausea and vomiting over the past week prior to admission. Abdominal ultrasound showed dense slightly heterogeneous liver with morphologic suggestive of cirrhosis. Latest liver function panel 02/24/2012 with steady improvement. Followup cardiology services for findings of chronic systolic and diastolic heart failure with mild elevation in troponins along with rhabdomyolysis. 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 Not a candidate for ACE inhibitors or ARB due to 2 acute renal failure Slept ok, appetite good Review of  Systems  Eyes: Positive for double vision.  All other systems reviewed and are negative.     Objective: Vital Signs: Blood pressure 155/90, pulse 60, temperature 98.4 F (36.9 C), temperature source Oral, resp. rate 18, height 5\' 11"  (1.803 m), weight 116.574 kg (257 lb), SpO2 96.00%. Ct Head Wo Contrast  02/28/2012  *RADIOLOGY REPORT*  Clinical Data: Left eye diplopia and headache, evaluate for hemorrhage  CT HEAD WITHOUT CONTRAST  Technique:  Contiguous axial images were obtained from the base of the skull through the vertex without contrast.  Comparison: MRI brain dated 02/20/2012  Findings: No evidence of parenchymal hemorrhage or extra-axial fluid collection. No mass lesion, mass effect, or midline shift.  No CT evidence of acute infarction.  Specifically, the foci of restricted diffusion noted on recent MRI are not visualized on CT.  Subcortical white matter hypodensities, predominantly in the bilateral frontal lobes, left greater than right (series 2/image 20).  This appearance likely reflects small vessel ischemic changes.  Mild cortical atrophy.  No ventriculomegaly.  Mucosal thickening in the bilateral ethmoid and maxillary sinuses. The mastoid air cells are clear.  No evidence of calvarial fracture.  IMPRESSION: No evidence of intracranial hemorrhage.  Foci of restricted effusion noted on recent MRI are not visualized on CT.  Atrophy with small vessel ischemic changes.   Original Report Authenticated By: Julian Hy, M.D.    Dg Chest Port 1 View  02/29/2012  *RADIOLOGY REPORT*  Clinical Data: Possible edema  PORTABLE CHEST - 1 VIEW  Comparison: Portable chest x-ray of 02/18/2010  Findings: The lungs are not optimally aerated but no focal infiltrate or effusion is seen.  Cardiomegaly is stable.  No skeletal abnormality is seen.  IMPRESSION: Stable cardiomegaly.  No active lung disease.   Original Report Authenticated By: Ivar Drape, M.D.    Results for orders placed during the hospital  encounter of 02/29/12 (from the past 72 hour(s))  CBC     Status: Abnormal   Collection Time   02/29/12  8:20 PM      Component Value Range Comment   WBC 7.4  4.0 - 10.5 K/uL    RBC 3.88 (*) 4.22 - 5.81 MIL/uL    Hemoglobin 11.8 (*) 13.0 - 17.0 g/dL    HCT 36.0 (*) 39.0 - 52.0 %    MCV 92.8  78.0 - 100.0 fL    MCH 30.4  26.0 - 34.0 pg    MCHC 32.8  30.0 - 36.0 g/dL    RDW 14.5  11.5 - 15.5 %    Platelets 278  150 - 400 K/uL   CREATININE, SERUM     Status: Abnormal   Collection Time   02/29/12  8:20 PM      Component Value Range Comment   Creatinine, Ser 3.79 (*) 0.50 - 1.35 mg/dL    GFR calc non Af Amer 16 (*) >90 mL/min    GFR calc Af Amer 19 (*) >90 mL/min      HEENT: normal and Strabismus R eye with lasteral deviation, can adduct Cardio: RRR Resp: CTA B/L GI: BS positive Extremity:  Pulses positive and No Edema Skin:   Intact Neuro: Alert/Oriented, Flat, Cranial Nerve Abnormalities L CN 6 palsy, Normal Sensory, Normal Motor and Abnormal FMC Ataxic/ dec FMC Musc/Skel:  Normal Gen NADPass pointing L FNF   Assessment/Plan: 1. Functional deficits secondary to R>L cerebellar infarcts which require 3+ hours per day of interdisciplinary therapy in a comprehensive inpatient rehab setting. Physiatrist is providing close team supervision and 24 hour management of active medical problems listed below. Physiatrist and rehab team continue to assess barriers to discharge/monitor patient progress toward functional and medical goals. FIM:                   Comprehension Comprehension: 5-Understands complex 90% of the time/Cues < 10% of the time  Expression Expression: 5-Expresses complex 90% of the time/cues < 10% of the time  Social Interaction Social Interaction: 6-Interacts appropriately with others with medication or extra time (anti-anxiety, antidepressant).  Problem Solving Problem Solving: 5-Solves complex 90% of the time/cues < 10% of the time  Memory Memory:  5-Recognizes or recalls 90% of the time/requires cueing < 10% of the time  Medical Problem List and Plan:  1. Embolic bilateral cerebellar infarcts  2. DVT Prophylaxis/Anticoagulation: Subcutaneous heparin. Monitor platelet counts and any signs of bleeding  3. Mood/schizophrenia. Provide emotional support and positive reinforcement. Patient states he takes Abilify prior to admission although this is not documented in the old records. We'll discuss further with patient  4. Neuropsych: This patient is capable of making decisions on his/her own behalf.  5. Acute renal failure in setting of rhabdomyolysis. Felt to be multi-factorial from ATN/hypotension. Currently remains on IV fluids 75 cc an hour and Lasix to be changed to 40 mg by mouth twice a day. Weight patient daily followup chemistries with latest creatinine 4.37. Renal services following  6. Chronic systolic and diastolic heart failure. Latest chest x-ray no active disease. Continue Lasix therapy. Followup per cardiology services as needed  7. Hypertension. Coreg 6.25 mg twice a day, Bidil 20-37.5 mg 3 times a day. Monitor with increased activity  8. Alcohol/polysubstance abuse. Monitor for signs of withdrawal and provide counseling     LOS (Days) 1 A FACE TO FACE EVALUATION WAS PERFORMED  KIRSTEINS,ANDREW E 03/01/2012, 8:23 AM

## 2012-03-01 NOTE — Progress Notes (Signed)
Pt arrived on first shift with belongings. At 1930 pt alert and responsive no c/o pain, no skin issues with VSS. Safety plan reviewed with pt with verbal understanding. Bed alarm for first 24 hours. Will continue to monitor.

## 2012-03-01 NOTE — Progress Notes (Signed)
Occupational Therapy Session Note  Patient Details  Name: [name] [name] MRN: [id number] Date of Birth: [birth year]  Today's Date: 03/01/2012 Time: [id number] Time Calculation (min): 43 min  Short Term Goals: Week 1:  OT Short Term Goal 1 (Week 1): Short term goals equal to long term goals which are set at modified independent level for ADLS.  Skilled Therapeutic Interventions/Progress Updates:   Began working on visual tracking exercises in the room.  Issued pt handout and large "A" written on an index card.  Instructed pt to begin with both eyes tracking the "A" horizontally, vertically, and diagonally.  Progressed to tracking each individual eye as well in all directions.  Pt with the right eye in exotrophic position at rest if both eyes were open.  If the left eye is occluded, the right eye will position itself more medially.  Pt with difficulty tracking with both eyes and fatigues quickly.  Progressed down to the tub/shower to practice transfers next.  He was able to step in and out of a slightly lower ceramic tub with min guard assist.  Finished by using the [employer] to work on weightshifts and balance strategies.  Note pt reporting slight dizziness with standing and mobility as well.    Therapy Documentation Precautions:  Precautions Precautions: Fall Precaution Comments: diplopia,  visual deficits Restrictions Weight Bearing Restrictions: No  Vital Signs: Therapy Vitals Pulse Rate: 86  Patient Position, if appropriate: Sitting Oxygen Therapy SpO2: 97 % O2 Device: None (Room air) Pulse Oximetry Type: Intermittent Pain: Pain Assessment Pain Assessment: No/denies pain Pain Score: 0-No pain ADL: See FIM for current functional status  Therapy/Group: Individual Therapy  Durelle Zepeda OTR/L 03/01/2012, 2:20 PM

## 2012-03-01 NOTE — Plan of Care (Signed)
Problem: RH BOWEL ELIMINATION Goal: RH STG MANAGE BOWEL WITH ASSISTANCE STG Manage Bowel with min Assistance.  Outcome: Not Progressing Per report pt hasnt had BM since 02/25/12.

## 2012-03-01 NOTE — Plan of Care (Signed)
Problem: RH BOWEL ELIMINATION Goal: RH STG MANAGE BOWEL W/MEDICATION W/ASSISTANCE STG Manage Bowel with Medication with min Assistance.  Outcome: Not Progressing Pt constipated with prn medication needed

## 2012-03-01 NOTE — Evaluation (Signed)
Occupational Therapy Assessment and Plan  Patient Details  Name: [name] [name] MRN: [id number] Date of Birth: April 15, 1955  OT Diagnosis: abnormal posture, disturbance of vision, hemiplegia affecting non-dominant side, lumbago (low back pain) and muscle weakness (generalized) Rehab Potential: Rehab Potential: Excellent ELOS: 7 days   Today's Date: 03/01/2012 Time: 1004-1100 Time Calculation (min): 56 min  Problem List:  Patient Active Problem List  Diagnosis  . Altered mental status  . Schizophrenia  . HTN (hypertension)  . Hepatitis C  . Cerebellar infarct  . Acute renal failure (ARF)  . Transaminitis  . Rhabdomyolysis  . Thrombocytopenia  . Increased anion gap metabolic acidosis  . Alcohol abuse  . CHF (congestive heart failure) EF 30-35%  . CVA (cerebral infarction)    Past Medical History:  Past Medical History  Diagnosis Date  . Hypertension   . Schizophrenia   . Hepatitis C   . HIT (heparin-induced thrombocytopenia)   . H/O: alcohol abuse   . H/O cocaine abuse   . Stroke    Past Surgical History:  Past Surgical History  Procedure Date  . Back surgery     Assessment & Plan Clinical Impression: Patient is a 57 y.o. year old male with recent admission to the hospital on  Admitted 02/20/2012 with altered mental status, left-sided numbness and slurred speech. MRI of the brain showed small acute cerebellar infarcts right greater than left as well as questionable acute left external capsule and caudate nuclear punctate lacunar infarct.  Patient transferred to CIR on 02/29/2012 .    Patient currently requires min with basic self-care skills and IADL secondary to muscle weakness, impaired timing and sequencing, unbalanced muscle activation and decreased coordination and decreased visual motor skills.  Prior to hospitalization, patient could complete ADLS with independent .  Patient will benefit from skilled intervention to decrease level of assist with basic self-care  skills, increase independence with basic self-care skills and increase level of independence with iADL prior to discharge home independently.  Anticipate patient will reach modified independent level but will benefit from home health OT eval for safety depending on progress.   OT - End of Session Activity Tolerance: Endurance does not limit participation in activity Endurance Deficit: No OT Assessment Rehab Potential: Excellent Barriers to Discharge: None OT Plan OT Intensity: Minimum of 1-2 x/day, 45 to 90 minutes OT Frequency: 5 out of 7 days OT Duration/Estimated Length of Stay: 7 days OT Treatment/Interventions: Medical illustrator training;Community reintegration;Discharge planning;Neuromuscular re-education;Functional mobility training;DME/adaptive equipment instruction;Patient/family education;Self Care/advanced ADL retraining;Therapeutic Activities;Visual/perceptual remediation/compensation;UE/LE Coordination activities;UE/LE Strength taining/ROM;Therapeutic Exercise OT Recommendation Patient destination: Assisted Living Follow Up Recommendations: Home health OT Equipment Recommended: Tub/shower seat   OT Evaluation Precautions/Restrictions  Precautions Precautions: Fall Precaution Comments: diplopia,  visual deficits Restrictions Weight Bearing Restrictions: No  Vital Signs Therapy Vitals Pulse Rate: 61  Patient Position, if appropriate: Sitting Oxygen Therapy SpO2: 97 % O2 Device: None (Room air) Pulse Oximetry Type: Intermittent Pain Pain Assessment Pain Assessment: No/denies pain Pain Score: 0-No pain Home Living/Prior Functioning Home Living Lives With: Alone Available Help at Discharge: Available PRN/intermittently;Other (Comment);Home health;Family (sister comes by daily and daily CNA) Type of Home: Apartment Home Access: Level entry Home Layout: One level Bathroom Shower/Tub: Chiropodist: Standard Bathroom Accessibility: Yes How  Accessible: Accessible via walker Home Adaptive Equipment: None IADL History Homemaking Responsibilities: Yes Meal Prep Responsibility: Primary Laundry Responsibility: Primary Cleaning Responsibility: Primary Bill Paying/Finance Responsibility: Primary Mode of Transportation: Bus Prior Function Level of Independence: Independent with transfers;Independent with gait;Needs  assistance with homemaking;Other (comment) (CNA assists with cooking and cleaning) Able to Take Stairs?: Yes Driving: No Vocation: On disability ADL  See FIM scale Vision/Perception  Vision - History Baseline Vision: Other (comment) (cataract removed on right eye, has glasses but doesn't wear ) Visual History: Cataracts Patient Visual Report: Blurring of vision Vision - Assessment Eye Alignment: Impaired (comment) Vision Assessment: Vision tested Ocular Range of Motion: Restricted on the left Tracking/Visual Pursuits: Left eye does not track laterally;Left eye does not track medially Diplopia Assessment: Only with right gaze;Only with left gaze Additional Comments: Pt with the right eye resting in exotrophic position.  Can move in all directions with tracing but is not smooth.  In the left eye he demonstrates decreased occular ROM and tracking in all quadrants.  Lost fixation with overall tracking.  Pt reports diplopia in the far left and right visual fields but not in central field.  Right eye tracks medially to follow object but will come off of the object once the object is moved.  If the left eye is covered up the right eye will move from exotrophis to more midline.   Perception Perception: Not tested Praxis Praxis: Intact  Cognition Overall Cognitive Status: Appears within functional limits for tasks assessed Arousal/Alertness: Awake/alert Orientation Level: Oriented X4 Attention: Sustained Sustained Attention: Appears intact Memory: Appears intact Awareness: Appears intact Problem Solving: Appears  intact Safety/Judgment: Appears intact Sensation Sensation Light Touch: Impaired Detail Light Touch Impaired Details: Impaired LUE Stereognosis: Appears Intact Proprioception: Appears Intact Additional Comments: Pt with decreased light touch sensation in the left hand when compared to the right. Coordination Gross Motor Movements are Fluid and Coordinated: Yes Fine Motor Movements are Fluid and Coordinated: No Coordination and Movement Description: Slight decreased FM manipulation in the hands bilaterally.  Pt reports difficulty with tying and shoes and occasionally buttoning buttons. Heel Shin Test: Slight dysmetria/undershooting noted L LE Motor  Motor Motor: Hemiplegia;Abnormal postural alignment and control Motor - Skilled Clinical Observations: No clonus noted B LE (gastroc and soleus); Very slight hemiplegia noted L LE. Mobility  Bed Mobility Bed Mobility: Not assessed Transfers Sit to Stand: 4: Min guard;Without upper extremity assist Stand to Sit: 4: Min guard;Without upper extremity assist  Trunk/Postural Assessment  Cervical Assessment Cervical Assessment: Exceptions to [facility] Cervical Strength Overall Cervical Strength Comments: Pt with forward cervical protraction and slight flexion Thoracic Assessment Thoracic Assessment: Within Functional Limits Lumbar Assessment Lumbar Assessment: Exceptions to [facility] Lumbar Strength Overall Lumbar Strength Comments: Pt demonstrates slight kyphosis and has history of lumbar surgery. Postural Control Postural Control: Deficits on evaluation Postural Limitations: Sits with L trunk shortened   Balance Balance Balance Assessed: Yes Standardized Balance Assessment Standardized Balance Assessment: Berg Balance Test Berg Balance Test Sit to Stand: Able to stand without using hands and stabilize independently Standing Unsupported: Able to stand 2 minutes with supervision Sitting with Back Unsupported but Feet Supported on Floor or  Stool: Able to sit safely and securely 2 minutes Stand to Sit: Sits safely with minimal use of hands Transfers: Able to transfer safely, definite need of hands Standing Unsupported with Eyes Closed: Able to stand 10 seconds with supervision Standing Ubsupported with Feet Together: Able to place feet together independently and stand for 1 minute with supervision From Standing, Reach Forward with Outstretched Arm: Can reach forward >5 cm safely (2") From Standing Position, Pick up Object from Floor: Able to pick up shoe, needs supervision From Standing Position, Turn to Look Behind Over each Shoulder: Turn sideways only  but maintains balance Turn 360 Degrees: Able to turn 360 degrees safely but slowly Standing Unsupported, Alternately Place Feet on Step/Stool: Able to stand independently and safely and complete 8 steps in 20 seconds Standing Unsupported, One Foot in Front: Able to plae foot ahead of the other independently and hold 30 seconds Standing on One Leg: Tries to lift leg/unable to hold 3 seconds but remains standing independently Total Score: 41  Static Standing Balance Static Standing - Balance Support: No upper extremity supported;During functional activity Static Standing - Level of Assistance: 5: Stand by assistance;4: Min assist Static Standing - Comment/# of Minutes: stand by- min guard required during static standing to urinate and then to stand at sink to wash hands. Dynamic Standing Balance Dynamic Standing - Balance Support: No upper extremity supported Dynamic Standing - Level of Assistance: Other (comment) (min guard) Dynamic Standing - Balance Activities: Other (comment) (dressing tasks) Extremity/Trunk Assessment RUE Assessment RUE Assessment: Within Functional Limits (overall strength 4/5 throughout) LUE Assessment LUE Assessment: Within Functional Limits (strength 5/5 throughout)  FIM:  FIM - Grooming Grooming Steps: Wash, rinse, dry face;Wash, rinse, dry  hands;Shave or apply make-up Grooming: 5: Supervision: safety issues or verbal cues FIM - Bathing Bathing Steps Patient Completed: Chest;Right Arm;Left Arm;Abdomen;Front perineal area;Buttocks;Right upper leg;Left upper leg;Right lower leg (including foot) Bathing: 4: Min-Patient completes 8-9 14f 10 parts or 75+ percent FIM - Upper Body Dressing/Undressing Upper body dressing/undressing steps patient completed: Thread/unthread right sleeve of pullover shirt/dresss;Thread/unthread left sleeve of pullover shirt/dress;Put head through opening of pull over shirt/dress;Pull shirt over trunk Upper body dressing/undressing: 5: Supervision: Safety issues/verbal cues FIM - Lower Body Dressing/Undressing Lower body dressing/undressing steps patient completed: Thread/unthread right pants leg;Thread/unthread left pants leg;Pull pants up/down;Don/Doff right sock Lower body dressing/undressing: 4: Min-Patient completed 75 plus % of tasks FIM - Toileting Toileting steps completed by patient: Adjust clothing prior to toileting;Adjust clothing after toileting;Performs perineal hygiene Toileting: 5: Supervision: Safety issues/verbal cues FIM - Bed/Chair Transfer Bed/Chair Transfer Assistive Devices: Arm rests Bed/Chair Transfer: 4: Bed > Chair or W/C: Min A (steadying Pt. > 75%);4: Chair or W/C > Bed: Min A (steadying Pt. > 75%) FIM - Radio producer Devices: Grab bars;Elevated toilet seat Toilet Transfers: 4-To toilet/BSC: Min A (steadying Pt. > 75%);4-From toilet/BSC: Min A (steadying Pt. > 75%) FIM - Tub/Shower Transfers Tub/shower Transfers: 0-Activity did not occur or was simulated   Refer to Care Plan for Long Term Goals  Recommendations for other services: None  Discharge Criteria: Patient will be discharged from OT if patient refuses treatment 3 consecutive times without medical reason, if treatment goals not met, if there is a change in medical status, if patient makes  no progress towards goals or if patient is discharged from hospital.  The above assessment, treatment plan, treatment alternatives and goals were discussed and mutually agreed upon: by patient  Began selfcare retraining sink level sit to stand.  Pt with difficulty reaching the LLE for bathing and dressing tasks.  Overall min guard assist for balance when ambulating around the room to gather clothes and towels.  Bettie Capistran OTR/L 03/01/2012, 12:59 PM

## 2012-03-01 NOTE — Progress Notes (Signed)
Social Work Assessment and Plan Social Work Assessment and Plan  Patient Details  Name: [name] [name] MRN: [id number] Date of Birth: 08/26/1955  Today's Date: 03/01/2012  Problem List:  Patient Active Problem List  Diagnosis  . Altered mental status  . Schizophrenia  . HTN (hypertension)  . Hepatitis C  . Cerebellar infarct  . Acute renal failure (ARF)  . Transaminitis  . Rhabdomyolysis  . Thrombocytopenia  . Increased anion gap metabolic acidosis  . Alcohol abuse  . CHF (congestive heart failure) EF 30-35%  . CVA (cerebral infarction)   Past Medical History:  Past Medical History  Diagnosis Date  . Hypertension   . Schizophrenia   . Hepatitis C   . HIT (heparin-induced thrombocytopenia)   . H/O: alcohol abuse   . H/O cocaine abuse   . Stroke    Past Surgical History:  Past Surgical History  Procedure Date  . Back surgery    Social History:  reports that he has never smoked. He does not have any smokeless tobacco history on file. He reports that he drinks alcohol. He reports that he does not use illicit drugs.  Family / Support Systems Marital Status: Single Patient Roles: Caregiver;Other (Comment) (Siblings) Other Supports: [name] [name]-sister  [phone]  [phone]   [name] [name]-sister  [phone] Anticipated Caregiver: Has PCS-3-4 hours daily and sister to come bya and check on pt also Ability/Limitations of Caregiver: Intermittent assist, can not provide 24 hr supervision level Caregiver Availability: Intermittent Family Dynamics: Pt reports he has three siblings-two sister's and a brother.  They make sure he is doing ok, they come by and check on him frequently.  He reprots he has friends also.  Social History Preferred language: English Religion: Christian Cultural Background: No issues Education: High School Read: Yes Write: Yes Employment Status: Disabled Freight forwarder Issues: No issues Guardian/Conservator: None-according  to MD pt is capable of making his own decisions   Abuse/Neglect Physical Abuse: Denies Verbal Abuse: Denies Sexual Abuse: Denies Exploitation of patient/patient's resources: Denies Self-Neglect: Denies  Emotional Status Pt's affect, behavior adn adjustment status: Pt is encouraged by the progress he has made thus far.  He is most concerned about his vision.  He was told by the MD this will get better which makes him feel better.  He is motivated to improve and get better. Recent Psychosocial Issues: Other medical issues, but he has adapted to them over the years Pyschiatric History: History of schizophrenia takes meds to regulate this.  He feels he is doing ok with this he has been dealing with it for so long now.  Depression screen score-2, he feels good about his progress here.  Will continue to monitor him while here. Substance Abuse History: Hisotry of cocaine abuse but quit and he does drink, but feels this is controlled.  Aware of the risks he takes with medication reaction between.  Patient / Family Perceptions, Expectations & Goals Pt/Family understanding of illness & functional limitations: Pt and sister are able to explain his stroke and deficits.  Both have a good understanding of what has happened and are optimistic regarding his recovery. 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) Transportation available  at discharge: Family members Resource referrals recommended: Support group (specify) ([facility] Support group)  Discharge Planning Living Arrangements: Alone Support Systems: Other  relatives;Friends/neighbors Type of Residence: Private residence Insurance Resources: Medicaid (specify county);Multimedia programmer (specify) ([place]) Financial Resources: SSD Financial Screen Referred: No Living Expenses: Rent Money Management: Patient;Family Do you have any problems obtaining your medications?: No Home Management: PCS worker Patient/Family Preliminary Plans: Return home with his PCS worker and siblings assisting if necessary.  They can not provide 24 hr supervision, but intermittent assist only. Social Work Anticipated Follow Up Needs: HH/OP;Support Group DC Planning Additional Notes/Comments: Pt is doing well, should be short length of stay  Clinical Impression Pleasant gentleman who is motivated and ready to work in therapies.  He is doing well and should not be here long.  Supportive sister who is willing to do what she can for him.  Will Resume PCS services and arrange follow up therapies.  [name] [name] 03/01/2012, 2:02 PM

## 2012-03-01 NOTE — Progress Notes (Signed)
Patient information reviewed and entered into eRehab system by Daiva Nakayama, RN, CRRN, [place] Coordinator.  Information including medical coding and functional independence measure will be reviewed and updated through discharge.     Per nursing patient was given "Data Collection Information Summary for Patients in Inpatient Rehabilitation Facilities with attached "Privacy Act [place] Records" upon admission.  Patients record was undergoing merge process and unavailable for admitting RN to document.

## 2012-03-01 NOTE — Discharge Summary (Signed)
Internal Medicine Teaching Service Attending Note Date: 03/01/2012  Patient name: [name] [name]  Medical record number: [id number]  Date of birth: 18-Jul-1955    This patient has been seen and discussed with the house staff. Please see their note for complete details. I concur with their findings with the following additions/corrections:  Patient's rhabdomyolysis is improved, kidney function stabilizing and with good urine output  [name] 03/01/2012, 2:15 PM

## 2012-03-02 ENCOUNTER — Encounter (HOSPITAL_COMMUNITY): Payer: PRIVATE HEALTH INSURANCE

## 2012-03-02 ENCOUNTER — Inpatient Hospital Stay (HOSPITAL_COMMUNITY): Payer: PRIVATE HEALTH INSURANCE | Admitting: Occupational Therapy

## 2012-03-02 ENCOUNTER — Inpatient Hospital Stay (HOSPITAL_COMMUNITY): Payer: PRIVATE HEALTH INSURANCE | Admitting: *Deleted

## 2012-03-02 MED ORDER — TRAZODONE HCL 50 MG PO TABS
50.0000 mg | ORAL_TABLET | Freq: Every evening | ORAL | Status: DC | PRN
  Administered 2012-03-02: 50 mg via ORAL
  Filled 2012-03-02: qty 1

## 2012-03-02 NOTE — Progress Notes (Signed)
Social Work Patient ID: [name] [name], male   DOB: 01-29-1955, 57 y.o.   MRN: [id number] Met with pt and spoke with his social Shirley Muscat with [facility]PS regarding his team conference goals-mod/i level and discharge 2/12. She reports, she case manages his care and follow his at home.  Pt felt this was long, but understood when informed Renal MD's monitoring his renal Function.  He is pleased with how well he is doing, but still concerned about his vision.  He reports: " It is better today."  Will resume his PCS services and Work toward discharge.

## 2012-03-02 NOTE — Progress Notes (Signed)
Occupational Therapy Session Note  Patient Details  Name: [name] [name] MRN: [id number] Date of Birth: 02-Feb-1955  Today's Date: 03/02/2012 Time: [id number] Time Calculation (min): 42 min  Skilled Therapeutic Interventions/Progress Updates:    Began session by having pt use the Nustep for 12 mins total in 4 minute increments.  Initially had pt work with resistance set at level 3, using both his arms and legs with emphasis on strengthening the LLE.  For his second set continued the same with resistance set at level 6.  On the third set had pt focus on just using his LEs and not using his UEs to assist.  Transitioned to working on dynamic standing balance while performing ring toss.  Had pt stand on foam surface for balance while reaching for rings in various positions to challenge his balance.  Pt overall min guard assist but did exhibit one LOB to the right when having to reach up and grasp a ring on the left side.  Pt utilized reacher to pick rings up off of the floor secondary to a history of back surgery and limitations.  Pt still reporting diplopia especially when looking in the superior/lateral field on both the left and right sides.    Therapy Documentation Precautions:  Precautions Precautions: Fall Precaution Comments: diplopia, visual deficits  Restrictions Weight Bearing Restrictions: No  Pain: Pain Assessment Pain Assessment: No/denies pain Pain Score: 0-No pain Pain Type: Acute pain Pain Descriptors: Headache Pain Intervention(s): Medication (See eMAR)   Therapy/Group: Individual Therapy  Lilymae Swiech OTR/L 03/02/2012, 3:54 PM

## 2012-03-02 NOTE — Progress Notes (Signed)
Patient ID: [name] [name], male   DOB: 05/11/1955, 57 y.o.   MRN: [id number]  Subjective/Complaints: 57 y.o. right-handed male with history of hypertension, schizophrenia polysubstance abuse and hepatitis C. Patient lives alone in independent prior to admission. Admitted 02/20/2012 with altered mental status, left-sided numbness and slurred speech. MRI of the brain showed small acute cerebellar infarcts right greater than left as well as questionable acute left external capsule and caudate nuclear punctate lacunar infarct. Echocardiogram with ejection fraction of 30% with diffuse hypokinesis. Patient did not receive TPA. TEE completed showing PFO with right to left shunt, ejection fraction 30%. Lower extremity Dopplers showed no signs of DVT. Neurology services was consulted and presently maintained on aspirin therapy for stroke prophylaxis. Noted admission creatinine of 2.34 with latest check creatinine 1.2 02/08/2012 elevated to 9.54 and renal services has been consulted. Acute renal failure likely multi-factorial from ATN from hypotension. Placed on intravenous Lasix and monitored. Patient did not receive hemodialysis with latest creatinine 4.37 showing good improvement as he continues maintained on intravenous fluids and Lasix. Urine drug screen upon admission was negative noted alcohol level 105. Findings of elevated liver function studies upon admission was noted reports of nausea and vomiting over the past week prior to admission. Abdominal ultrasound showed dense slightly heterogeneous liver with morphologic suggestive of cirrhosis. Latest liver function panel 02/24/2012 with steady improvement. Followup cardiology services for findings of chronic systolic and diastolic heart failure with mild elevation in troponins along with rhabdomyolysis. 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 Not a candidate for ACE inhibitors or ARB due to 2 acute renal failure Slept ok, appetite good PT notes L  foot swelling, pt denies pain.   Review of Systems  Eyes: Positive for double vision.  All other systems reviewed and are negative.     Objective: Vital Signs: Blood pressure 179/98, pulse 60, temperature 97.9 F (36.6 C), temperature source Oral, resp. rate 19, height 5\' 11"  (1.803 m), weight 116.574 kg (257 lb), SpO2 98.00%. No results found. Results for orders placed during the hospital encounter of 02/29/12 (from the past 72 hour(s))  CBC     Status: Abnormal   Collection Time   02/29/12  8:20 PM      Component Value Range Comment   WBC 7.4  4.0 - 10.5 K/uL    RBC 3.88 (*) 4.22 - 5.81 MIL/uL    Hemoglobin 11.8 (*) 13.0 - 17.0 g/dL    HCT 36.0 (*) 39.0 - 52.0 %    MCV 92.8  78.0 - 100.0 fL    MCH 30.4  26.0 - 34.0 pg    MCHC 32.8  30.0 - 36.0 g/dL    RDW 14.5  11.5 - 15.5 %    Platelets 278  150 - 400 K/uL   CREATININE, SERUM     Status: Abnormal   Collection Time   02/29/12  8:20 PM      Component Value Range Comment   Creatinine, Ser 3.79 (*) 0.50 - 1.35 mg/dL    GFR calc non Af Amer 16 (*) >90 mL/min    GFR calc Af Amer 19 (*) >90 mL/min   CBC WITH DIFFERENTIAL     Status: Abnormal   Collection Time   03/01/12  9:50 AM      Component Value Range Comment   WBC 7.3  4.0 - 10.5 K/uL    RBC 4.24  4.22 - 5.81 MIL/uL    Hemoglobin 12.8 (*) 13.0 - 17.0 g/dL    HCT 39.4  39.0 - 52.0 %    MCV 92.9  78.0 - 100.0 fL    MCH 30.2  26.0 - 34.0 pg    MCHC 32.5  30.0 - 36.0 g/dL    RDW 14.6  11.5 - 15.5 %    Platelets 294  150 - 400 K/uL    Neutrophils Relative 64  43 - 77 %    Neutro Abs 4.7  1.7 - 7.7 K/uL    Lymphocytes Relative 19  12 - 46 %    Lymphs Abs 1.4  0.7 - 4.0 K/uL    Monocytes Relative 11  3 - 12 %    Monocytes Absolute 0.8  0.1 - 1.0 K/uL    Eosinophils Relative 5  0 - 5 %    Eosinophils Absolute 0.4  0.0 - 0.7 K/uL    Basophils Relative 1  0 - 1 %    Basophils Absolute 0.1  0.0 - 0.1 K/uL   COMPREHENSIVE METABOLIC PANEL     Status: Abnormal   Collection Time    03/01/12  9:50 AM      Component Value Range Comment   Sodium 137  135 - 145 mEq/L    Potassium 3.9  3.5 - 5.1 mEq/L    Chloride 104  96 - 112 mEq/L    CO2 21  19 - 32 mEq/L    Glucose, Bld 94  70 - 99 mg/dL    BUN 43 (*) 6 - 23 mg/dL    Creatinine, Ser 2.96 (*) 0.50 - 1.35 mg/dL    Calcium 9.1  8.4 - 10.5 mg/dL    Total Protein 7.0  6.0 - 8.3 g/dL    Albumin 3.1 (*) 3.5 - 5.2 g/dL    AST 93 (*) 0 - 37 U/L    ALT 140 (*) 0 - 53 U/L    Alkaline Phosphatase 144 (*) 39 - 117 U/L    Total Bilirubin 1.2  0.3 - 1.2 mg/dL    GFR calc non Af Amer 22 (*) >90 mL/min    GFR calc Af Amer 25 (*) >90 mL/min      HEENT: normal and Strabismus R eye with lasteral deviation, can adduct Cardio: RRR Resp: CTA B/L GI: BS positive Extremity:  Pulses positive and No Edema Skin:   Intact Neuro: Alert/Oriented, Flat, Cranial Nerve Abnormalities L CN 6 palsy, Normal Sensory, Normal Motor and Abnormal FMC Ataxic/ dec FMC Musc/Skel:  Normal Gen NADPass pointing L FNF   Assessment/Plan: 1. Functional deficits secondary to R>L cerebellar infarcts which require 3+ hours per day of interdisciplinary therapy in a comprehensive inpatient rehab setting. Physiatrist is providing close team supervision and 24 hour management of active medical problems listed below. Physiatrist and rehab team continue to assess barriers to discharge/monitor patient progress toward functional and medical goals. FIM: FIM - Bathing Bathing Steps Patient Completed: Chest;Right Arm;Left Arm;Abdomen;Front perineal area;Buttocks;Right upper leg;Left upper leg;Right lower leg (including foot) Bathing: 4: Min-Patient completes 8-9 58f 10 parts or 75+ percent  FIM - Upper Body Dressing/Undressing Upper body dressing/undressing steps patient completed: Thread/unthread right sleeve of pullover shirt/dresss;Thread/unthread left sleeve of pullover shirt/dress;Put head through opening of pull over shirt/dress;Pull shirt over trunk Upper body  dressing/undressing: 5: Supervision: Safety issues/verbal cues FIM - Lower Body Dressing/Undressing Lower body dressing/undressing steps patient completed: Thread/unthread right pants leg;Thread/unthread left pants leg;Pull pants up/down;Don/Doff right sock Lower body dressing/undressing: 4: Min-Patient completed 75 plus % of tasks  FIM - Toileting Toileting steps completed by patient:  Adjust clothing prior to toileting;Performs perineal hygiene;Adjust clothing after toileting Toileting Assistive Devices: Grab bar or rail for support Toileting: 6: Assistive device: No helper  FIM - Radio producer Devices: Grab bars;Elevated toilet seat Toilet Transfers: 4-To toilet/BSC: Min A (steadying Pt. > 75%);4-From toilet/BSC: Min A (steadying Pt. > 75%)  FIM - Bed/Chair Transfer Bed/Chair Transfer Assistive Devices: Arm rests Bed/Chair Transfer: 4: Bed > Chair or W/C: Min A (steadying Pt. > 75%);4: Chair or W/C > Bed: Min A (steadying Pt. > 75%)  FIM - Locomotion: Wheelchair Distance: 0 Locomotion: Wheelchair: 0: Activity did not occur FIM - Locomotion: Ambulation Ambulation/Gait Assistance: 4: Min guard Locomotion: Ambulation: 2: Travels 50 - 149 ft with minimal assistance (Pt.>75%)  Comprehension Comprehension Mode: Auditory Comprehension: 6-Follows complex conversation/direction: With extra time/assistive device  Expression Expression Mode: Verbal Expression: 6-Expresses complex ideas: With extra time/assistive device  Social Interaction Social Interaction: 6-Interacts appropriately with others with medication or extra time (anti-anxiety, antidepressant).  Problem Solving Problem Solving: 6-Solves complex problems: With extra time  Memory Memory: 5-Recognizes or recalls 90% of the time/requires cueing < 10% of the time  Medical Problem List and Plan:  1. Embolic bilateral cerebellar infarcts  2. DVT Prophylaxis/Anticoagulation: Subcutaneous heparin.  Monitor platelet counts and any signs of bleeding  3. Mood/schizophrenia. Provide emotional support and positive reinforcement. Patient states he takes Abilify prior to admission although this is not documented in the old records. We'll discuss further with patient  4. Neuropsych: This patient is capable of making decisions on his/her own behalf.  5. Acute renal failure in setting of rhabdomyolysis.slowly improving Felt to be multi-factorial from ATN/hypotension. Currently remains on IV fluids 75 cc an hour and Lasix to be changed to 40 mg by mouth twice a day. Weight patient daily followup chemistries with latest creatinine 4.37. Renal services following  6. Chronic systolic and diastolic heart failure. Latest chest x-ray no active disease. Continue Lasix therapy. Followup per cardiology services as needed  7. Hypertension. Coreg 6.25 mg twice a day, Bidil 20-37.5 mg 3 times a day. Monitor with increased activity  8. Alcohol/polysubstance abuse. Monitor for signs of withdrawal and provide counseling  9.  Left foot swelling now evidence of DVT will order TED hose   LOS (Days) 2 A FACE TO FACE EVALUATION WAS PERFORMED  Cornelis Kluver E 03/02/2012, 8:38 AM

## 2012-03-02 NOTE — Progress Notes (Addendum)
Occupational Therapy Note  Patient Details  Name: [name] [name] MRN: [id number] Date of Birth: [birth year] Today's Date: 03/02/2012  Time: 8am-9am (44min.) 1:1 OT session this morning with focus on BADL's, functional mobility, transfers and overall safety awareness. Pt sitting in recliner eating breakfast upon arrival. Pt agreeable to cover up IV in order to shower. Placed elevated toilet seat in shower for energy conservation which pt used while completing LB bathing. Intermittent verbal cues throughout session for safety, ie: safest place for hand/foot placement, when to sit down. Pt completed dressing from recliner. Pt states he has a reacher at home and has lost his sock aide. Will need to obtain sock aide for pt and rehearse use (was not able this morning secondary to time constraints.) Reported edema in LLE,to MD, with MD possibly ordering TED hose. Pt fatigued at end of session. Spoke to pt about energy conservation techniques and knowing when to sit down for safety. Pt denies pain.  Keontay Vora Corlis Leak 03/02/2012, 10:46 AM

## 2012-03-02 NOTE — Progress Notes (Signed)
Physical Therapy Session Note  Patient Details  Name: [name] [name] MRN: [id number] Date of Birth: 1955-10-05  Today's Date: 03/02/2012 Time: [id number] and 1132-1200 Time Calculation (min): 58 min and 28 min  Short Term Goals: Week 1:  PT Short Term Goal 1 (Week 1): STGs=LTGs secondary to short LOS  Skilled Therapeutic Interventions/Progress Updates:    First session Patient received sitting in recliner. This session focused on gait training and high level balance activities, see details below. Patient with complaints of mild dizziness when sit<>stand performed and continues with functional mobility. Dizziness does not appear to inhibit functional mobility and only occurs when patient stands from sitting.  Patient returned to room and left seated in recliner with all needs mild within reach.  Second session: Patient received sitting in recliner. This session focused on increasing functional activity tolerance with gait training and 6 minute walk test and high level balance with tandem walking. Patient performed 6 minute walk test and walked total of 818 feet in 6 minutes with 0 rest breaks. Normative data for community dwelling elderly subjects ages 61-69 without AD (patient is 39) is: 1,877 feet.  Patient performed tandem walking 50' x1 with min guard and 2 instances of min assist for minor LOB posteriorly and to the R. Patient unable to achieve true tandem walking, but was able to ambulate with small BOS (modified tandem walking). Patient demonstrates good balance strategies when he does have LOB.  Patient returned to room and left seated in recliner with all needs mild within reach.  Therapy Documentation Precautions:  Precautions Precautions: Fall Precaution Comments: diplopia, visual deficits  Restrictions Weight Bearing Restrictions: No Pain: Pain Assessment Pain Assessment: No/denies pain Pain Score: 0-No pain Locomotion : Ambulation Ambulation: Yes Ambulation/Gait  Assistance: 4: Min guard Ambulation Distance (Feet): 135 Feet x2 Assistive device: None Ambulation/Gait Assistance Details: Patient requires verbal cues for upright posture and increasing step/stride length. Gait Gait: Yes Gait Pattern: Step-through pattern;Wide base of support;Decreased stance time - left;Decreased weight shift to left;Decreased stride length High Level Ambulation High Level Ambulation: Side stepping;Sudden stops;Head turns;Toe walking;Heel walking;Other high level ambulation Side Stepping: 50'x2 leading with each LE with min guard Sudden Stops: 100' x2 with min guard, able to stop immediately and with no LOB Head Turns: 100'x2 with horizontal head turns and min guard; 100' x2 with vertical head turns and min guard; gait speed decreases only slightly Toe Walking: 30' x1 with min guard and L handrail Heel Walking: 30'x1 with min guard and R handrail; patient with difficulty maintaing active DF High Level Ambulation - Other Comments: 100'x2 with gait velocity changes and min guard. Patient demonstrates fluid change in gait speed and no LOB; Patient performed 100' x1 ambulation while bouncing ball with min guard. Patient demonstrates change in gait speed only initially, but as ambulation increases patient able to maintain consistent gait speed while bouncing ball. Stairs / Additional Locomotion Stairs: Yes Stairs Assistance: 4: Min guard Stair Management Technique: One rail Left;One rail Right;Other (comment) (alternating pattern ascending, step to pattern descending) Number of Stairs: 10  Height of Stairs: 6  Wheelchair Mobility Wheelchair Mobility: No Distance: 0  Balance: Balance Balance Assessed: Yes Dynamic Standing Balance Dynamic Standing - Balance Activities: Kicking ball Dynamic Standing - Comments: Patient performed single leg stance task with instructions to tap 4 numbered cards with specific leg to promote balance challenge and increased weight bearing through  each LE, emphasis on L. Patient performed one round x2' of kicking ball against a wall with min  guard; no overt LOB. Patient demonstrates good balance reactions and several times was able to stand on one LE and kick ball 2-3 times without putting foot down.  See FIM for current functional status  Therapy/Group: Individual Therapy  [name], PT, DPT  03/02/2012, 11:12 AM

## 2012-03-03 ENCOUNTER — Inpatient Hospital Stay (HOSPITAL_COMMUNITY): Payer: PRIVATE HEALTH INSURANCE

## 2012-03-03 ENCOUNTER — Inpatient Hospital Stay (HOSPITAL_COMMUNITY): Payer: PRIVATE HEALTH INSURANCE | Admitting: Occupational Therapy

## 2012-03-03 DIAGNOSIS — I69993 Ataxia following unspecified cerebrovascular disease: Secondary | ICD-10-CM

## 2012-03-03 DIAGNOSIS — I6340 Cerebral infarction due to embolism of unspecified cerebral artery: Secondary | ICD-10-CM

## 2012-03-03 DIAGNOSIS — I10 Essential (primary) hypertension: Secondary | ICD-10-CM

## 2012-03-03 LAB — BASIC METABOLIC PANEL
BUN: 27 mg/dL — ABNORMAL HIGH (ref 6–23)
Creatinine, Ser: 1.99 mg/dL — ABNORMAL HIGH (ref 0.50–1.35)
GFR calc Af Amer: 41 mL/min — ABNORMAL LOW (ref >90)
GFR calc non Af Amer: 35 mL/min — ABNORMAL LOW (ref >90)

## 2012-03-03 MED ORDER — OXYCODONE-ACETAMINOPHEN 5-325 MG PO TABS
1.0000 | ORAL_TABLET | Freq: Once | ORAL | Status: AC
  Administered 2012-03-04: 1 via ORAL
  Filled 2012-03-03: qty 1

## 2012-03-03 MED ORDER — SODIUM CHLORIDE 0.9 % IV SOLN
INTRAVENOUS | Status: DC
  Administered 2012-03-03 (×3): via INTRAVENOUS

## 2012-03-03 NOTE — Progress Notes (Signed)
Occupational Therapy Session Note  Patient Details  Name: [name] [name] MRN: [id number] Date of Birth: [birth year]  Today's Date: 03/03/2012 Time: [id number] Time Calculation (min): 30 min  Short Term Goals: Week 1:  OT Short Term Goal 1 (Week 1): Short term goals equal to long term goals which are set at modified independent level for ADLS.  Skilled Therapeutic Interventions/Progress Updates:    Pt ambulated down to the ADL apartment to begin session.  Worked on home management task of making up the bed with close supervision throughout task.  Needed one rest break during the task overall.  Pt ambulated to the gym and finished session with 2 sets of UE ergonometer, one forward and one reverse for 4 mins each, and level 10 resistance.  Pt able to perform without difficulty.  Ambulated back to the room with supervision at end of session.  Therapy Documentation Precautions:  Precautions Precautions: Fall Precaution Comments: diplopia, visual deficits Restrictions Weight Bearing Restrictions: No  Pain: Pt reported no pain during OT session  Therapy/Group: Individual Therapy  Vandy Tsuchiya 03/03/2012, 4:00 PM

## 2012-03-03 NOTE — Progress Notes (Signed)
Physical Therapy Session Note  Patient Details  Name: [name] [name] MRN: [id number] Date of Birth: [birth year]  Today's Date: 03/03/2012 Time: [id number] Time Calculation (min): 45 min  Short Term Goals: Week 1:  PT Short Term Goal 1 (Week 1): STGs=LTGs secondary to short LOS  Skilled Therapeutic Interventions/Progress Updates:  neuromuscular re-education via forced use, manual cues, VCs for: -Gait training with RW room to gym x 160' x 2, to and from room,  with supervision, VCs for upright posture and forward gaze. - standing on Kinetron at 30 cm/sec focusing on wt shifting before full LLE extension -Kinetron in sitting at 20 cm/second for alternating wt shifting and LE extension, with BUE support> 1 UE support -standing,  biased to L on Kinetron, x 5 minutes, during bil UE fine motor /visual activities with playing cards    Therapy Documentation Precautions:  Precautions Precautions: Fall Precaution Comments: diplopia, visual deficits Restrictions Weight Bearing Restrictions: No   Vital Signs: Therapy Vitals Pulse Rate: 63 BP: 151/91 mmHg (RN notified) Patient Position, if appropriate: Sitting Pain: Pain Assessment Pain Assessment: No/denies pain Pain Intervention(s): Medication (See eMAR)   Locomotion : Ambulation Ambulation/Gait Assistance: 5: Supervision     See FIM for current functional status  Therapy/Group: Individual Therapy  Jadi Deyarmin 03/03/2012, 3:28 PM

## 2012-03-03 NOTE — Patient Care Conference (Signed)
Inpatient RehabilitationTeam Conference and Plan of Care Update Date: 03/02/2012   Time: 11;25 Am    Patient Name: [name] [name]      Medical Record Number: [id number]  Date of Birth: 08-21-1955 Sex: Male         Room/Bed: 4151/4151-01 Payor Info: Payor: [employer] MEDICARE  Plan: [facility]  Product Type: *No Product type*     Admitting Diagnosis: CVA  Admit Date/Time:  02/29/2012  8:12 PM Admission Comments: No comment available   Primary Diagnosis:  CVA (cerebral infarction) Principal Problem: CVA (cerebral infarction)  Patient Active Problem List   Diagnosis Date Noted  . Thrombocytopenia 02/29/2012  . Increased anion gap metabolic acidosis [id number]  . Alcohol abuse 02/29/2012  . CHF (congestive heart failure) EF 30-35% 02/29/2012  . CVA (cerebral infarction) 02/29/2012  . Rhabdomyolysis 02/22/2012  . Cerebellar infarct 02/21/2012  . Acute renal failure (ARF) 02/21/2012  . Transaminitis 02/21/2012  . Altered mental status 02/20/2012  . Schizophrenia 02/20/2012  . HTN (hypertension) 02/20/2012  . Hepatitis C     Expected Discharge Date: Expected Discharge Date: 03/09/12  Team Members Present: Physician leading conference: Dr. Alysia Penna Social Worker Present: Ovidio Kin, LCSW Nurse Present: Other (comment) Edmonia Lynch Hicks-RN) PT Present: Raylene Everts, PT;Becky Alwyn Ren, PT;Other (comment) Shelton Silvas ripa-PT) OT Present: Sim Boast, Lorelee Cover, OT SLP Present: Gunnar Fusi, SLP Other (Discipline and Name): laura Jobe-Dietician & Amado Nash Coordinator     Current Status/Progress Goal Weekly Team Focus  Medical   Dizziness, double vision, poor balance, acute renal failure  Improve kidney function  Nephrology to address renal issues   Bowel/Bladder   continent of bowel and bladder         Swallow/Nutrition/ Hydration     na        ADL's   Pt is currently supervision for UB selfcare and min guard to min assist for LB selfcare and toileting  tasks.  Does exhibit diplopia in the left lateral and right lateral fields and decreased right eye alignment and smoothness of tracking.  Left eye is limited in ROM in all directions as well.  All modified independent level  vision exercises, selfcare retraining,  balance tasks, home management tasks   Mobility   min guard overall  mod I overall  gait training, increasing activity tolerance, safety, high level balance, floor transfers   Communication     na        Safety/Cognition/ Behavioral Observations    na        Pain   complains of headaches at times. 2tylenol PRN q4hrs  3 or less  assess and monitor pain. medicate as needed   Skin   BL dry feet- flakey with eurcerin cream BID  no skin breakdown  assess skin qshift      *See Interdisciplinary Assessment and Plan and progress notes for long and short-term goals  Barriers to Discharge: Renal failure    Possible Resolutions to Barriers:  Continue treatment    Discharge Planning/Teaching Needs:  Home, with assist from [facility] worker and sister.  Does not have 24 hr care at discharge, only intermittent      Team Discussion:  Making good progress, Renal MD monitoring function-lasix and IV hydration.  Vision exercises given, slowly improving  Revisions to Treatment Plan:  None   Continued Need for Acute Rehabilitation Level of Care: The patient requires daily medical management by a physician with specialized training in physical medicine and rehabilitation for the following conditions: Daily direction of  a multidisciplinary physical rehabilitation program to ensure safe treatment while eliciting the highest outcome that is of practical value to the patient.: Yes Daily medical management of patient stability for increased activity during participation in an intensive rehabilitation regime.: Yes Daily analysis of laboratory values and/or radiology reports with any subsequent need for medication adjustment of medical intervention for :  Neurological problems;Other  Elease Hashimoto 03/03/2012, 8:53 AM

## 2012-03-03 NOTE — Progress Notes (Signed)
Occupational Therapy Session Note  Patient Details  Name: [name] [name] MRN: [id number] Date of Birth: 05-21-1955  Today's Date: 03/03/2012 Time: 0802-0900 Time Calculation (min): 58 min  Short Term Goals: Week 1:  OT Short Term Goal 1 (Week 1): Short term goals equal to long term goals which are set at modified independent level for ADLS.  Skilled Therapeutic Interventions/Progress Updates:    Pt worked on bathing and dressing sit to stand at the sink.  Able to gather clothes and towels without use of an assistive device with supervision.  He reported feeling a little more fatigued this am and also demonstrated a wider BOS with ambulation and decreased ability to perform adequate stance phase on the left side as well.  Overall supervision level for all aspects of bathing and dressing as well as toileting.  Finished session working on visual tracking exercises with both eyes open and each eye individually.  Still with decreased overall AROM in all directions for the left eye.  Still with significant exotrphic positioning of the right eye as well but had good occular ROM.  Reports diplopia in the left lateral field and in the superior right lateral field.    Therapy Documentation Precautions:  Precautions Precautions: Fall Precaution Comments: diplopia, visual deficits  Restrictions Weight Bearing Restrictions: No  Pain: Pain Assessment Pain Assessment: No/denies pain Pain Score: Asleep ADL: See FIM for current functional status  Therapy/Group: Individual Therapy  Evangelia Whitaker OTR/L 03/03/2012, 9:00 AM

## 2012-03-03 NOTE — Progress Notes (Signed)
Patient's BP 195/98 at 1449, recheck at 1551 BP 159/82.  Patient is asymptomatic, Silvestre Mesi, PA made aware.  Will continue to monitor.

## 2012-03-03 NOTE — Progress Notes (Signed)
Vestibular Assessment Therapy Note  Patient Details  Name: [name] [name] MRN: [id number] Date of Birth: July 12, 1955 Today's Date: 03/03/2012  Time In:  155  Time out:  11:55.  Individual session, no complaints of pain.  Primary treatment team requested one time vestibular assessment. Assessment completed and results are as follows: Deficits: Visual: 1.  Significant malalignment of both eyes.  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  At rest with both eyes open, right eye sits in severe abduction and left eye sits in midline.   2.  Diplopia in all left fields due to extremely limited abduction in left eye. 3.  Impaired oculomotor both eyes.  With left eye occluded, patient is able to recruit and move right eye in all planes.  Left eye has functional vertical movement but is severely limited in abduction and limited in adduction.  Ocular movement in  both eyes  improves with repetition and exercise. 4.  Per patient report, decreased acuity in left eye. 5.  Impaired saccadic movement with "frog hopping" evident in right eye. 6.  Impaired smooth pursuits in both eyes. Visual-Vestibular integration: 1.  SEVERE gaze stabilization deficit evident with just eye movement, just head movement with eyes open,  as well as head-eye movement.  Patient experiences "spinning and the whole world is moving whenever I move my eyes or move my head or move my eyes and head together."  2.  Visual-vestibular integration more impaired DISTALLY than close up (i.e. Greater than 3-4 feet) Motion sensitivity: 1.  Patient does experience increased symptoms of "spinning" with supine to sit, head up from right, head up from left, sit to stand.  I do think this is much more reflective of the visual-vestibular integration issues than true motion sensitivity as this greatly diminishes if patient closes his eyes during these movements (therefore would not focus on  motion based habituation exercises).     Treatment Plan recommendations: 1. Ocular motor exercises to address muscle weakness in eye control.  Would work one eye at a time as patient is better able to recruit with one eye occluded.  Once weakness is improved can graduate to exercises with both eyes at the same time. 2.  Gaze stabilization exercises starting in sitting with head still and moving eyes with moving target, to head still with moving eyes to fixed target, to moving eyes and head together with moving target.   Will need to work in all planes.  Again I would recommend occluding one eye at time and then moving toward both eyes at the same time. 3. Recommend alternate patching of one eye (2 hours per eye and then switching) to allow patient to functionalyl decrease diplopia as well as to assist patient in gaze stabilization. This will be especially critical during gait and mobility training as well as ADL activities to decrease the sense of vertigo during a functional task.  Once gaze stabilization issues have improved in sitting can then work on incorporating into standing and walking.   4.  Patient is already closing eyes as a way to decrease the sense of the vertigo as a compensation strategy.  Would also recommend teaching patient grounding in sitting and supine, as well as using a fixed target visually when moving in space.  Fixed target must be NO MORE THAN 3-5 feet away from the patient. A distal fixed target will actually make the sense of vertigo worse.  5.  I would provide isolated treatment initially ( for example work on either gaze stabilization or gait but not both at once).  Patient must have improved gaze stabilization and ocular motor  control before incorporating into gait or other functional activities.  I would initially patch and provide compensation for deficits while working on gait, mobility and ADL. Once improvement is seen with visual issues then therapists can begin to  integrate the two treatment areas.   An exercise program will be provided to the primary therapists along with suggested progression.        Quay Burow 03/03/2012, 2:09 PM

## 2012-03-03 NOTE — Progress Notes (Signed)
Patient ID: [name] [name], male   DOB: [birth year], 57 y.o.   MRN: [id number]  Subjective/Complaints: 57 y.o. right-handed male with history of hypertension, schizophrenia polysubstance abuse and hepatitis C. Patient lives alone in independent prior to admission. Admitted 02/20/2012 with altered mental status, left-sided numbness and slurred speech. MRI of the brain showed small acute cerebellar infarcts right greater than left as well as questionable acute left external capsule and caudate nuclear punctate lacunar infarct. Echocardiogram with ejection fraction of 30% with diffuse hypokinesis. Patient did not receive TPA. TEE completed showing PFO with right to left shunt, ejection fraction 30%. Lower extremity Dopplers showed no signs of DVT. Neurology services was consulted and presently maintained on aspirin therapy for stroke prophylaxis. Noted admission creatinine of 2.34 with latest check creatinine 1.2 02/08/2012 elevated to 9.54 and renal services has been consulted. Acute renal failure likely multi-factorial from ATN from hypotension. Placed on intravenous Lasix and monitored. Patient did not receive hemodialysis with latest creatinine 4.37 showing good improvement as he continues maintained on intravenous fluids and Lasix. Urine drug screen upon admission was negative noted alcohol level 105. Findings of elevated liver function studies upon admission was noted reports of nausea and vomiting over the past week prior to admission. Abdominal ultrasound showed dense slightly heterogeneous liver with morphologic suggestive of cirrhosis. Latest liver function panel 02/24/2012 with steady improvement. Followup cardiology services for findings of chronic systolic and diastolic heart failure with mild elevation in troponins along with rhabdomyolysis. 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 Not a candidate for ACE inhibitors or ARB due to 2 acute renal failure Slept ok, appetite good PT notes L  foot swelling, pt denies pain.   Ambulated 874ft in 6 monutes yesterday Review of Systems  Eyes: Positive for double vision.  All other systems reviewed and are negative.     Objective: Vital Signs: Blood pressure 150/80, pulse 67, temperature 98.4 F (36.9 C), temperature source Oral, resp. rate 18, height 5\' 11"  (1.803 m), weight 120.5 kg (265 lb 10.5 oz), SpO2 90.00%. No results found. Results for orders placed during the hospital encounter of 02/29/12 (from the past 72 hour(s))  CBC     Status: Abnormal   Collection Time   02/29/12  8:20 PM      Component Value Range Comment   WBC 7.4  4.0 - 10.5 K/uL    RBC 3.88 (*) 4.22 - 5.81 MIL/uL    Hemoglobin 11.8 (*) 13.0 - 17.0 g/dL    HCT 36.0 (*) 39.0 - 52.0 %    MCV 92.8  78.0 - 100.0 fL    MCH 30.4  26.0 - 34.0 pg    MCHC 32.8  30.0 - 36.0 g/dL    RDW 14.5  11.5 - 15.5 %    Platelets 278  150 - 400 K/uL   CREATININE, SERUM     Status: Abnormal   Collection Time   02/29/12  8:20 PM      Component Value Range Comment   Creatinine, Ser 3.79 (*) 0.50 - 1.35 mg/dL    GFR calc non Af Amer 16 (*) >90 mL/min    GFR calc Af Amer 19 (*) >90 mL/min   CBC WITH DIFFERENTIAL     Status: Abnormal   Collection Time   03/01/12  9:50 AM      Component Value Range Comment   WBC 7.3  4.0 - 10.5 K/uL    RBC 4.24  4.22 - 5.81 MIL/uL    Hemoglobin 12.8 (*) 13.0 -  17.0 g/dL    HCT 39.4  39.0 - 52.0 %    MCV 92.9  78.0 - 100.0 fL    MCH 30.2  26.0 - 34.0 pg    MCHC 32.5  30.0 - 36.0 g/dL    RDW 14.6  11.5 - 15.5 %    Platelets 294  150 - 400 K/uL    Neutrophils Relative 64  43 - 77 %    Neutro Abs 4.7  1.7 - 7.7 K/uL    Lymphocytes Relative 19  12 - 46 %    Lymphs Abs 1.4  0.7 - 4.0 K/uL    Monocytes Relative 11  3 - 12 %    Monocytes Absolute 0.8  0.1 - 1.0 K/uL    Eosinophils Relative 5  0 - 5 %    Eosinophils Absolute 0.4  0.0 - 0.7 K/uL    Basophils Relative 1  0 - 1 %    Basophils Absolute 0.1  0.0 - 0.1 K/uL   COMPREHENSIVE METABOLIC  PANEL     Status: Abnormal   Collection Time   03/01/12  9:50 AM      Component Value Range Comment   Sodium 137  135 - 145 mEq/L    Potassium 3.9  3.5 - 5.1 mEq/L    Chloride 104  96 - 112 mEq/L    CO2 21  19 - 32 mEq/L    Glucose, Bld 94  70 - 99 mg/dL    BUN 43 (*) 6 - 23 mg/dL    Creatinine, Ser 2.96 (*) 0.50 - 1.35 mg/dL    Calcium 9.1  8.4 - 10.5 mg/dL    Total Protein 7.0  6.0 - 8.3 g/dL    Albumin 3.1 (*) 3.5 - 5.2 g/dL    AST 93 (*) 0 - 37 U/L    ALT 140 (*) 0 - 53 U/L    Alkaline Phosphatase 144 (*) 39 - 117 U/L    Total Bilirubin 1.2  0.3 - 1.2 mg/dL    GFR calc non Af Amer 22 (*) >90 mL/min    GFR calc Af Amer 25 (*) >90 mL/min      HEENT: normal and Strabismus R eye with lasteral deviation, can adduct Cardio: RRR Resp: CTA B/L GI: BS positive Extremity:  Pulses positive and No Edema Skin:   Intact Neuro: Alert/Oriented, Flat, Cranial Nerve Abnormalities L CN 6 palsy, Normal Sensory, Normal Motor and Abnormal FMC Ataxic/ dec FMC Musc/Skel:  Normal Gen NADPass pointing L FNF   Assessment/Plan: 1. Functional deficits secondary to R>L cerebellar infarcts which require 3+ hours per day of interdisciplinary therapy in a comprehensive inpatient rehab setting. Physiatrist is providing close team supervision and 24 hour management of active medical problems listed below. Physiatrist and rehab team continue to assess barriers to discharge/monitor patient progress toward functional and medical goals. FIM: FIM - Bathing Bathing Steps Patient Completed: Chest;Right Arm;Left Arm;Abdomen;Front perineal area;Buttocks;Right upper leg;Left upper leg;Right lower leg (including foot) Bathing: 4: Min-Patient completes 8-9 64f 10 parts or 75+ percent  FIM - Upper Body Dressing/Undressing Upper body dressing/undressing steps patient completed: Thread/unthread right sleeve of pullover shirt/dresss;Thread/unthread left sleeve of pullover shirt/dress;Put head through opening of pull over  shirt/dress;Pull shirt over trunk Upper body dressing/undressing: 5: Supervision: Safety issues/verbal cues FIM - Lower Body Dressing/Undressing Lower body dressing/undressing steps patient completed: Thread/unthread right underwear leg;Thread/unthread left underwear leg;Pull underwear up/down;Thread/unthread right pants leg;Thread/unthread left pants leg;Pull pants up/down;Don/Doff right sock;Don/Doff left sock;Don/Doff right shoe  Lower body dressing/undressing: 4: Min-Patient completed 75 plus % of tasks (did not donn L shoe secondary to swelling)  FIM - Toileting Toileting steps completed by patient: Adjust clothing prior to toileting;Performs perineal hygiene;Adjust clothing after toileting Toileting Assistive Devices: Grab bar or rail for support Toileting: 6: Assistive device: No helper  FIM - Radio producer Devices: Grab bars;Elevated toilet seat Toilet Transfers: 4-To toilet/BSC: Min A (steadying Pt. > 75%);4-From toilet/BSC: Min A (steadying Pt. > 75%)  FIM - Bed/Chair Transfer Bed/Chair Transfer Assistive Devices: Arm rests Bed/Chair Transfer: 4: Bed > Chair or W/C: Min A (steadying Pt. > 75%);4: Chair or W/C > Bed: Min A (steadying Pt. > 75%)  FIM - Locomotion: Wheelchair Distance: 0 Locomotion: Wheelchair: 0: Activity did not occur FIM - Locomotion: Ambulation Ambulation/Gait Assistance: 4: Min guard Locomotion: Ambulation: 2: Travels 50 - 149 ft with minimal assistance (Pt.>75%)  Comprehension Comprehension Mode: Auditory Comprehension: 6-Follows complex conversation/direction: With extra time/assistive device  Expression Expression Mode: Verbal Expression: 6-Expresses complex ideas: With extra time/assistive device  Social Interaction Social Interaction: 6-Interacts appropriately with others with medication or extra time (anti-anxiety, antidepressant).  Problem Solving Problem Solving: 6-Solves complex problems: With extra  time  Memory Memory: 6-More than reasonable amt of time  Medical Problem List and Plan:  1. Embolic bilateral cerebellar infarcts  2. DVT Prophylaxis/Anticoagulation: Subcutaneous heparin. Monitor platelet counts and any signs of bleeding  3. Mood/schizophrenia. Provide emotional support and positive reinforcement. Patient states he takes Abilify prior to admission although this is not documented in the old records. We'll discuss further with patient  4. Neuropsych: This patient is capable of making decisions on his/her own behalf.  5. Acute renal failure in setting of rhabdomyolysis.slowly improving Felt to be multi-factorial from ATN/hypotension. Currently remains on IV fluids 75 cc an hour and Lasix to be changed to 40 mg by mouth twice a day. Weight patient daily followup chemistries with latest creatinine 2.96. Repeat today Renal services following  6. Chronic systolic and diastolic heart failure. Latest chest x-ray no active disease. Continue Lasix therapy. Followup per cardiology services as needed  7. Hypertension. Coreg 6.25 mg twice a day, Bidil 20-37.5 mg 3 times a day. Monitor with increased activity, increasing, will reduce IVF, may need to increase bidil 8. Alcohol/polysubstance abuse. Monitor for signs of withdrawal and provide counseling  9.  Left foot swelling now evidence of DVT will order TED hose   LOS (Days) 3 A FACE TO FACE EVALUATION WAS PERFORMED  Artesha Wemhoff E 03/03/2012, 8:19 AM

## 2012-03-04 ENCOUNTER — Inpatient Hospital Stay (HOSPITAL_COMMUNITY): Payer: PRIVATE HEALTH INSURANCE | Admitting: Occupational Therapy

## 2012-03-04 ENCOUNTER — Inpatient Hospital Stay (HOSPITAL_COMMUNITY): Payer: PRIVATE HEALTH INSURANCE | Admitting: *Deleted

## 2012-03-04 DIAGNOSIS — M79609 Pain in unspecified limb: Secondary | ICD-10-CM

## 2012-03-04 DIAGNOSIS — I6340 Cerebral infarction due to embolism of unspecified cerebral artery: Secondary | ICD-10-CM

## 2012-03-04 DIAGNOSIS — I10 Essential (primary) hypertension: Secondary | ICD-10-CM

## 2012-03-04 DIAGNOSIS — M7989 Other specified soft tissue disorders: Secondary | ICD-10-CM

## 2012-03-04 DIAGNOSIS — I69993 Ataxia following unspecified cerebrovascular disease: Secondary | ICD-10-CM

## 2012-03-04 LAB — BASIC METABOLIC PANEL
Calcium: 8.8 mg/dL (ref 8.4–10.5)
Creatinine, Ser: 1.72 mg/dL — ABNORMAL HIGH (ref 0.50–1.35)
GFR calc Af Amer: 49 mL/min — ABNORMAL LOW (ref >90)

## 2012-03-04 MED ORDER — SODIUM CHLORIDE 0.45 % IV SOLN
INTRAVENOUS | Status: DC
  Administered 2012-03-04 – 2012-03-07 (×4): via INTRAVENOUS

## 2012-03-04 MED ORDER — OXYCODONE-ACETAMINOPHEN 5-325 MG PO TABS
1.0000 | ORAL_TABLET | Freq: Three times a day (TID) | ORAL | Status: DC | PRN
  Administered 2012-03-04 – 2012-03-06 (×5): 1 via ORAL
  Filled 2012-03-04 (×5): qty 1

## 2012-03-04 NOTE — Plan of Care (Signed)
Problem: RH PAIN MANAGEMENT Goal: RH STG PAIN MANAGED AT OR BELOW PT'S PAIN GOAL < 3  Outcome: Not Progressing Reporting pain however only order for tylenol; reports that does not effectively relieve pain/discomfort

## 2012-03-04 NOTE — Progress Notes (Addendum)
Patient ID: [name] [name], male   DOB: [birth year], 57 y.o.   MRN: [id number]  Subjective/Complaints: 57 y.o. right-handed male with history of hypertension, schizophrenia polysubstance abuse and hepatitis C. Patient lives alone in independent prior to admission. Admitted 02/20/2012 with altered mental status, left-sided numbness and slurred speech. MRI of the brain showed small acute cerebellar infarcts right greater than left as well as questionable acute left external capsule and caudate nuclear punctate lacunar infarct. Echocardiogram with ejection fraction of 30% with diffuse hypokinesis. Patient did not receive TPA. TEE completed showing PFO with right to left shunt, ejection fraction 30%. Lower extremity Dopplers showed no signs of DVT. Neurology services was consulted and presently maintained on aspirin therapy for stroke prophylaxis. Noted admission creatinine of 2.34 with latest check creatinine 1.2 02/08/2012 elevated to 9.54 and renal services has been consulted. Acute renal failure likely multi-factorial from ATN from hypotension. Placed on intravenous Lasix and monitored. Patient did not receive hemodialysis with latest creatinine 4.37 showing good improvement as he continues maintained on intravenous fluids and Lasix. Urine drug screen upon admission was negative noted alcohol level 105. Findings of elevated liver function studies upon admission was noted reports of nausea and vomiting over the past week prior to admission. Abdominal ultrasound showed dense slightly heterogeneous liver with morphologic suggestive of cirrhosis. Latest liver function panel 02/24/2012 with steady improvement. Followup cardiology services for findings of chronic systolic and diastolic heart failure with mild elevation in troponins along with rhabdomyolysis. 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 Not a candidate for ACE inhibitors or ARB due to 2 acute renal failure Slept ok, appetite good LLE Pain and  swelling  Review of Systems  Eyes: Positive for double vision.  All other systems reviewed and are negative.     Objective: Vital Signs: Blood pressure 161/92, pulse 78, temperature 98.8 F (37.1 C), temperature source Oral, resp. rate 18, height 5\' 11"  (1.803 m), weight 120.5 kg (265 lb 10.5 oz), SpO2 100.00%. No results found. Results for orders placed during the hospital encounter of 02/29/12 (from the past 72 hour(s))  CBC WITH DIFFERENTIAL     Status: Abnormal   Collection Time   03/01/12  9:50 AM      Component Value Range Comment   WBC 7.3  4.0 - 10.5 K/uL    RBC 4.24  4.22 - 5.81 MIL/uL    Hemoglobin 12.8 (*) 13.0 - 17.0 g/dL    HCT 39.4  39.0 - 52.0 %    MCV 92.9  78.0 - 100.0 fL    MCH 30.2  26.0 - 34.0 pg    MCHC 32.5  30.0 - 36.0 g/dL    RDW 14.6  11.5 - 15.5 %    Platelets 294  150 - 400 K/uL    Neutrophils Relative 64  43 - 77 %    Neutro Abs 4.7  1.7 - 7.7 K/uL    Lymphocytes Relative 19  12 - 46 %    Lymphs Abs 1.4  0.7 - 4.0 K/uL    Monocytes Relative 11  3 - 12 %    Monocytes Absolute 0.8  0.1 - 1.0 K/uL    Eosinophils Relative 5  0 - 5 %    Eosinophils Absolute 0.4  0.0 - 0.7 K/uL    Basophils Relative 1  0 - 1 %    Basophils Absolute 0.1  0.0 - 0.1 K/uL   COMPREHENSIVE METABOLIC PANEL     Status: Abnormal   Collection Time   03/01/12  9:50 AM      Component Value Range Comment   Sodium 137  135 - 145 mEq/L    Potassium 3.9  3.5 - 5.1 mEq/L    Chloride 104  96 - 112 mEq/L    CO2 21  19 - 32 mEq/L    Glucose, Bld 94  70 - 99 mg/dL    BUN 43 (*) 6 - 23 mg/dL    Creatinine, Ser 2.96 (*) 0.50 - 1.35 mg/dL    Calcium 9.1  8.4 - 10.5 mg/dL    Total Protein 7.0  6.0 - 8.3 g/dL    Albumin 3.1 (*) 3.5 - 5.2 g/dL    AST 93 (*) 0 - 37 U/L    ALT 140 (*) 0 - 53 U/L    Alkaline Phosphatase 144 (*) 39 - 117 U/L    Total Bilirubin 1.2  0.3 - 1.2 mg/dL    GFR calc non Af Amer 22 (*) >90 mL/min    GFR calc Af Amer 25 (*) >90 mL/min   BASIC METABOLIC PANEL      Status: Abnormal   Collection Time   03/03/12  9:16 AM      Component Value Range Comment   Sodium 141  135 - 145 mEq/L    Potassium 3.6  3.5 - 5.1 mEq/L    Chloride 107  96 - 112 mEq/L    CO2 24  19 - 32 mEq/L    Glucose, Bld 116 (*) 70 - 99 mg/dL    BUN 27 (*) 6 - 23 mg/dL DELTA CHECK NOTED   Creatinine, Ser 1.99 (*) 0.50 - 1.35 mg/dL    Calcium 9.1  8.4 - 10.5 mg/dL    GFR calc non Af Amer 35 (*) >90 mL/min    GFR calc Af Amer 41 (*) >90 mL/min   BASIC METABOLIC PANEL     Status: Abnormal   Collection Time   03/04/12  6:30 AM      Component Value Range Comment   Sodium 141  135 - 145 mEq/L    Potassium 3.6  3.5 - 5.1 mEq/L    Chloride 109  96 - 112 mEq/L    CO2 23  19 - 32 mEq/L    Glucose, Bld 114 (*) 70 - 99 mg/dL    BUN 21  6 - 23 mg/dL    Creatinine, Ser 1.72 (*) 0.50 - 1.35 mg/dL    Calcium 8.8  8.4 - 10.5 mg/dL    GFR calc non Af Amer 42 (*) >90 mL/min    GFR calc Af Amer 49 (*) >90 mL/min      HEENT: normal and Strabismus R eye with lasteral deviation, can adduct Cardio: RRR Resp: CTA B/L GI: BS positive Extremity:  Pulses positive and No Edema Skin:   Intact Neuro: Alert/Oriented, Flat, Cranial Nerve Abnormalities L CN 6 palsy, Normal Sensory, Normal Motor and Abnormal FMC Ataxic/ dec FMC Musc/Skel:  Normal Gen NADPass pointing L FNF   Assessment/Plan: 1. Functional deficits secondary to R>L cerebellar infarcts which require 3+ hours per day of interdisciplinary therapy in a comprehensive inpatient rehab setting. Physiatrist is providing close team supervision and 24 hour management of active medical problems listed below. Physiatrist and rehab team continue to assess barriers to discharge/monitor patient progress toward functional and medical goals. FIM: FIM - Bathing Bathing Steps Patient Completed: Chest;Right Arm;Left Arm;Abdomen;Front perineal area;Buttocks;Right upper leg;Left upper leg Bathing: 5: Supervision: Safety issues/verbal cues  FIM - Upper Body  Dressing/Undressing Upper body dressing/undressing steps patient completed: Thread/unthread right sleeve of pullover shirt/dresss;Thread/unthread left sleeve of pullover shirt/dress;Put head through opening of pull over shirt/dress;Pull shirt over trunk Upper body dressing/undressing: 5: Supervision: Safety issues/verbal cues FIM - Lower Body Dressing/Undressing Lower body dressing/undressing steps patient completed: Thread/unthread right underwear leg;Thread/unthread left underwear leg;Pull underwear up/down;Thread/unthread right pants leg;Thread/unthread left pants leg;Pull pants up/down;Don/Doff right sock;Don/Doff left sock;Don/Doff right shoe;Don/Doff left shoe;Fasten/unfasten right shoe;Fasten/unfasten left shoe Lower body dressing/undressing: 5: Supervision: Safety issues/verbal cues  FIM - Toileting Toileting steps completed by patient: Performs perineal hygiene;Adjust clothing after toileting Toileting Assistive Devices: Grab bar or rail for support Toileting: 5: Supervision: Safety issues/verbal cues  FIM - Radio producer Devices: Elevated toilet seat;Grab bars Toilet Transfers: 5-Set-up assist to: Apply orthosis/W/C setup;5-From toilet/BSC: Supervision (verbal cues/safety issues)  FIM - Control and instrumentation engineer Devices: Arm rests Bed/Chair Transfer: 5: Bed > Chair or W/C: Supervision (verbal cues/safety issues);5: Chair or W/C > Bed: Supervision (verbal cues/safety issues)  FIM - Locomotion: Wheelchair Distance: 0 Locomotion: Wheelchair: 0: Activity did not occur FIM - Locomotion: Ambulation Locomotion: Ambulation Assistive Devices: Administrator Ambulation/Gait Assistance: 5: Supervision Locomotion: Ambulation: 5: Travels 150 ft or more with supervision/safety issues  Comprehension Comprehension Mode: Auditory Comprehension: 6-Follows complex conversation/direction: With extra time/assistive  device  Expression Expression Mode: Verbal Expression: 6-Expresses complex ideas: With extra time/assistive device  Social Interaction Social Interaction: 6-Interacts appropriately with others with medication or extra time (anti-anxiety, antidepressant).  Problem Solving Problem Solving: 6-Solves complex problems: With extra time  Memory Memory: 6-More than reasonable amt of time  Medical Problem List and Plan:  1. Embolic bilateral cerebellar infarcts  2. DVT Prophylaxis/Anticoagulation: Subcutaneous heparin. Monitor platelet counts and any signs of bleeding  3. Mood/schizophrenia. Provide emotional support and positive reinforcement. Patient states he takes Abilify prior to admission although this is not documented in the old records. We'll discuss further with patient  4. Neuropsych: This patient is capable of making decisions on his/her own behalf.  5. Acute renal failure in setting of rhabdomyolysis.slowly improving Felt to be multi-factorial from ATN/hypotension. Currently remains on IV fluids 50 cc an hour and Lasix to be changed to 40 mg by mouth twice a day. Will change IV to .45 NS at 70cc/hr 7P to 7AWeight patient daily followup chemistries with latest creatinine 2.96. Repeat today Renal services following  6. Chronic systolic and diastolic heart failure. Latest chest x-ray no active disease. Continue Lasix therapy. Followup per cardiology services as needed  7. Hypertension. Coreg 6.25 mg twice a day, Bidil 20-37.5 mg 3 times a day. Monitor with increased activity, increasing, will reduce IVF, may need to increase bidil 8. Alcohol/polysubstance abuse. Monitor for signs of withdrawal and provide counseling  9.  Left foot swelling, thigh and calf tenderness r/o DVT will order doppler, bedrest with bedside PT/OT until; results reviewed by MD/PA   LOS (Days) 4 A FACE TO FACE EVALUATION WAS PERFORMED  Jye Fariss E 03/04/2012, 8:52 AM

## 2012-03-04 NOTE — Progress Notes (Signed)
Occupational Therapy Session Note  Patient Details  Name: [name] [name] MRN: [id number] Date of Birth: [birth year]  Today's Date: 03/04/2012 Time: [id number] Time Calculation (min): 40 min  Short Term Goals: Week 1:  OT Short Term Goal 1 (Week 1): Short term goals equal to long term goals which are set at modified independent level for ADLS.  Skilled Therapeutic Interventions/Progress Updates:    Pt seen for 1:1 OT with focus on dynamic standing balance and NM re-ed with gaze stabilization exercises.  Pt asleep upon arrival, but easily aroused and willing to participate in therapy.  Pt ambulated with min assist to therapy gym and engaged in balance activities on Biodex with challenging weight shifting and limits of stability.  Pt with decreased ability to weight shift backwards and to Lt with and without UE support.  Progressed to alternating weight shift to follow target with multiple overshooting, again with and without UE support.  Engaged in balance activities alternating patch over Rt eye and both eyes unpatched.  Gaze stabilization exercises seated edge of mat with cues to keep head still and move eyes with moving target, alternating Rt and Lt eye patched.  Pt reports dizziness 6/10 with Lt eye open (Rt eye patched) but disappeared <10 secs.  Pt returned to room where sister present and all needs within reach.  Therapy Documentation Precautions:  Precautions Precautions: Fall Precaution Comments: diplopia, visual deficits Restrictions Weight Bearing Restrictions: No General:   Vital Signs: Therapy Vitals Pulse Rate: 64  Resp: 20  BP: 189/78 mmHg Patient Position, if appropriate: Sitting Oxygen Therapy SpO2: 99 % O2 Device: None (Room air) Pain: Pain Assessment Pain Type: Acute pain Pain Location: Head Pain Descriptors: Headache Pain Frequency: Intermittent Pain Onset: On-going Pain Intervention(s): Other (Comment) (refused tylenol)  See FIM for current functional  status  Therapy/Group: Individual Therapy  Sim Boast 03/04/2012, 3:20 PM

## 2012-03-04 NOTE — Progress Notes (Signed)
Physical Therapy Session Note  Patient Details  Name: [name] [name] MRN: [id number] Date of Birth: 10-07-1955  Today's Date: 03/04/2012 Time: 1500-1558 Time Calculation (min): 58 min  Short Term Goals: Week 1:  PT Short Term Goal 1 (Week 1): STGs=LTGs secondary to short LOS  Skilled Therapeutic Interventions/Progress Updates:    Patient received sitting in recliner; eye patch utilized during session and gaze stabilization initiated during functional mobility. Today's session focused on community ambulation (in hospital lobby), balance, and NMR to L LE. Patient performed gait training >300' in hospital lobby and hospital gift shop, negotiating obstacles and people. Patient performed balance activities to increase weight bearing and weight shifting to L LE: single leg stance with handrail for unilateral UE support, forward/retro stepping with R LE to increase weight bearing through L, tall kneeling with intermittent use of B or unilateral UE support, half kneeling with R LE forward to increae weight bearing through L LE.  Patient performed side stepping while pushing BOSU to the side to perform hip abduction and single leg stance.  Patient returned to room and left seated in recliner with all needs within reach.  Therapy Documentation Precautions:  Precautions Precautions: Fall Precaution Comments: diplopia, visual deficits  Restrictions Weight Bearing Restrictions: No Pain: Pain Assessment Pain Assessment: No/denies pain Pain Score: 0-No pain Locomotion : Ambulation Ambulation: Yes Ambulation/Gait Assistance: 5: Supervision Ambulation Distance (Feet): 300 Feet Assistive device: None Ambulation/Gait Assistance Details: Verbal cues for precautions/safety;Verbal cues for gait pattern Ambulation/Gait Assistance Details: Patient requires verbal cues for increasing step length on the R, weight bearing through R LE and negotiation of obstacles on L secondary to wearing eye patch on left  eye Gait Gait: Yes Gait Pattern: Impaired Gait Pattern: Step-through pattern;Wide base of support;Trunk flexed;Decreased weight shift to left;Decreased stance time - left;Decreased step length - right Stairs / Additional Locomotion Stairs: Yes Stairs Assistance: 5: Supervision Stairs Assistance Details: Verbal cues for precautions/safety;Verbal cues for technique Stair Management Technique: One rail Right;One rail Left;Forwards;Other (comment) (alternating pattern ascending, step to pattern descending) Number of Stairs: 15  Height of Stairs: 6  Wheelchair Mobility Wheelchair Mobility: No Distance: 0   See FIM for current functional status  Therapy/Group: Individual Therapy  Lillia Abed. Skyelyn Scruggs, PT, DPT  03/04/2012, 4:56 PM

## 2012-03-04 NOTE — Progress Notes (Signed)
Occupational Therapy Session Note  Patient Details  Name: [name] [name] MRN: [id number] Date of Birth: [birth year]  Today's Date: 03/04/2012 Time: 0805-0900 and 1100-1130 Time Calculation (min): 55 min and 30 min  Short Term Goals: No short term goals set  Skilled Therapeutic Interventions/Progress Updates:    Visit 1: Pt seen for B/D at sink level as he had a continuous IV.  No c/o pain.  Dizziness 5/10 throughout session.  Pt provided with eye patch and TED hose. Eyepatch placed over right eye, will be switched to left in 2 hours.  Pt was able to ambulate in room with supervision and donned shirt and pants with supervision.  Assisted with socks. Right foot swollen, physician ordered bed rest until doppler completed.  Pt went back to bed with call light in place.    Visit 2: No c/o pain.  Pt has returned from testing. Pt reported that the doppler was negative, no blood clot.  Pt ambulated to and used restroom with supervision.  He then worked on oculomotor exercises focusing on gaze stabilization with one eye occluded. He has good ROM in right eye and did well with all of the exercises, but he had great difficulty on left with very restricted ROM.  The exercises did not increase his current level of dizziness of 5/10.  Encouraged pt to work on gaze stabilization with left eye with a fixed and moving target.  Therapy Documentation Precautions:  Precautions Precautions: Fall Precaution Comments: diplopia, visual deficits Restrictions Weight Bearing Restrictions: No   Pain: Pain Assessment Pain Assessment: No/denies pain ADL:  See FIM for current functional status  Therapy/Group: Individual Therapy  [place] 03/04/2012, 9:31 AM

## 2012-03-04 NOTE — Progress Notes (Signed)
*  PRELIMINARY RESULTS* Vascular Ultrasound Left lower extremity venous duplex has been completed.  Preliminary findings: Left:  No evidence of DVT, superficial thrombosis, or Baker's cyst.   Landry Mellow, RDMS, RVT 03/04/2012, 10:52 AM

## 2012-03-05 ENCOUNTER — Inpatient Hospital Stay (HOSPITAL_COMMUNITY): Payer: PRIVATE HEALTH INSURANCE | Admitting: Physical Therapy

## 2012-03-05 ENCOUNTER — Encounter (HOSPITAL_COMMUNITY): Payer: PRIVATE HEALTH INSURANCE | Admitting: Occupational Therapy

## 2012-03-05 ENCOUNTER — Inpatient Hospital Stay (HOSPITAL_COMMUNITY): Payer: PRIVATE HEALTH INSURANCE | Admitting: Occupational Therapy

## 2012-03-05 DIAGNOSIS — D696 Thrombocytopenia, unspecified: Secondary | ICD-10-CM

## 2012-03-05 MED ORDER — HYDRALAZINE HCL 25 MG PO TABS
25.0000 mg | ORAL_TABLET | Freq: Four times a day (QID) | ORAL | Status: DC | PRN
  Administered 2012-03-05 – 2012-03-07 (×5): 25 mg via ORAL
  Filled 2012-03-05 (×6): qty 1

## 2012-03-05 NOTE — Progress Notes (Signed)
Physical Therapy Note  Patient Details  Name: [name] [name] MRN: [id number] Date of Birth: 29-Aug-1955 Today's Date: 03/05/2012  Time: 1455-1522 27 minutes  No c/o pain.  Gait in controlled and household environments with min A, pt with multiple episodes of LOB posteriorly with direction changes or quick stops, required min A to correct.  Standing kinetron for B hip strengthening with B UE support.  Pt fatigues quickly with this exercise, requires frequent rests.  Standing balance training with tap ups all directions, alternating tap/step ups, sit to stands without UE support all with close supervision-min A.  Individual therapy   [name] [name] 03/05/2012, 3:30 PM

## 2012-03-05 NOTE — Progress Notes (Signed)
[name] [name] is a 57 y.o. male 1955/09/16 [id number]  Subjective: No new complaints. No new problems. Feeling OK.  Objective: Vital signs in last 24 hours: Temp:  [98.2 F (36.8 C)-98.3 F (36.8 C)] 98.2 F (36.8 C) (02/08 0541) Pulse Rate:  [64-70] 66 (02/08 0825) Resp:  [16-20] 17 (02/08 0541) BP: (162-189)/(78-94) 179/79 mmHg (02/08 0825) SpO2:  [98 %-99 %] 98 % (02/08 0541) Weight change:  Last BM Date: 03/03/12  Intake/Output from previous day: 02/07 0701 - 02/08 0700 In: 1320 [P.O.:1320] Out: 1675 [Urine:1675] Last cbgs: CBG (last 3)  No results found for this basename: GLUCAP,  in the last 72 hours   Physical Exam General: No apparent distress    Lungs: Normal effort. Lungs clear to auscultation, no crackles or wheezes. Cardiovascular: Regular rate and rhythm, no edema Musculoskeletal:  Neurovascularly intact Neurological: No new neurological deficits Wounds: N/A      Lab Results: BMET    Component Value Date/Time   NA 141 03/04/2012 0630   K 3.6 03/04/2012 0630   CL 109 03/04/2012 0630   CO2 23 03/04/2012 0630   GLUCOSE 114* 03/04/2012 0630   BUN 21 03/04/2012 0630   CREATININE 1.72* 03/04/2012 0630   CALCIUM 8.8 03/04/2012 0630   GFRNONAA 42* 03/04/2012 0630   GFRAA 49* 03/04/2012 0630   CBC    Component Value Date/Time   WBC 7.3 03/01/2012 0950   RBC 4.24 03/01/2012 0950   HGB 12.8* 03/01/2012 0950   HCT 39.4 03/01/2012 0950   PLT 294 03/01/2012 0950   MCV 92.9 03/01/2012 0950   MCH 30.2 03/01/2012 0950   MCHC 32.5 03/01/2012 0950   RDW 14.6 03/01/2012 0950   LYMPHSABS 1.4 03/01/2012 0950   MONOABS 0.8 03/01/2012 0950   EOSABS 0.4 03/01/2012 0950   BASOSABS 0.1 03/01/2012 0950    Studies/Results: No results found.  Medications: I have reviewed the patient's current medications.  Assessment/Plan: Functional deficits secondary to R>L cerebellar infarcts which require 3+ hours per day of interdisciplinary therapy in a comprehensive inpatient rehab setting.  Physiatrist is  providing close team supervision and 24 hour management of active medical problems listed below.  Physiatrist and rehab team continue to assess barriers to discharge/monitor patient progress toward functional and medical goals. 1. Embolic bilateral cerebellar infarcts  2. DVT Prophylaxis/Anticoagulation: Subcutaneous heparin. Monitor platelet counts and any signs of bleeding  3. Mood/schizophrenia. Provide emotional support and positive reinforcement. Patient states he takes Abilify prior to admission although this is not documented in the old records. Will discuss further with patient  4. Neuropsych: This patient is capable of making decisions on his/her own behalf.  5. Acute renal failure in setting of rhabdomyolysis. slowly improving. Renal services following  6. Chronic systolic and diastolic heart failure. Latest chest x-ray no active disease. Continue Lasix therapy. Followup per cardiology services as needed  7. Hypertension. Uncontrolled on Coreg 6.25 mg twice a day, Bidil 20-37.5 mg 3 times a day. Add prn hydralazine q6h. Monitor with increased activity 8. Alcohol/polysubstance abuse. Monitor for signs of withdrawal and provide counseling  9. Left foot swelling, thigh and calf tenderness dopplers from 2/7 are negative for DVT. Mobilize.  Length of stay, days: 5  IRETON,SUSAN C , PA-C 03/05/2012, 9:28 AM  I have examined the patient and agree with above.  Valerie A. Asa Lente, MD 10:58 AM

## 2012-03-05 NOTE — Progress Notes (Signed)
Physical Therapy Session Note  Patient Details  Name: [name] [name] MRN: [id number] Date of Birth: [birth year]  Today's Date: 03/05/2012 Time: [id number] Time Calculation (min): 45 min  Short Term Goals: Week 1:  PT Short Term Goal 1 (Week 1): STGs=LTGs secondary to short LOS   Therapy Documentation Precautions:  Precautions: Fall Precaution Comments: diplopia, visual deficits  Restrictions Weight Bearing Restrictions: No Pain: Pain Assessment: No/denies pain  Gait Training:(15') ambulated to gym and back 2 x 250' without assistive device. Cues to not slide feet/pick up on swing phase.  No LOB noted. No eye patch on and negotiated stepping over objects on floor with good safety. Patient still notes weakness with Left eye tracking and double vision. Up/Down 4 steps using handrail with S/Independence. Therapeutic Activity:(15') Transfer training sit<->stand multiple times S/Mod-I with mild difficulty controlling descent in last few inches while sitting. Therapeutic Exercise:(15') Resisted hip abduction with manual application and blue theraband use.  Nursing notified that eye patch missing and to order new.   Therapy/Group: Individual Therapy  Clearence Ped 03/05/2012, 7:46 AM

## 2012-03-05 NOTE — Progress Notes (Signed)
Occupational Therapy Session Note  Patient Details  Name: [name] [name] MRN: [id number] Date of Birth: [birth year]  Today's Date: 03/05/2012 Time: [id number] and [id number] Time Calculation (min): 41 min and 57 min  Short Term Goals: Week 1:  OT Short Term Goal 1 (Week 1): Short term goals equal to long term goals which are set at modified independent level for ADLS.  Skilled Therapeutic Interventions/Progress Updates:    1) Pt seen for 1:1 OT with focus on donning socks and safety with ambulation around room without AD. Pt reports getting dressed prior to this session secondary to PT scheduled prior to this session.  Pt with difficulty donning Rt sock, however able to don Lt sock by crossing leg over Rt knee.  Pt completed grooming with supervision and toileting with supervision.  Engaged in dynamic standing activities to challenge balance when reaching outside BOS, with pt requiring close supervision.  Eye patch over Rt eye this session to attempt to improve dizziness and double vision with ambulation.  2) Pt seen for 1:1 OT with focus on dynamic standing balance and NM re-ed with focus on gaze stabilization exercises.  Engaged in balance activity while standing on dynamic surface to increase challenge and elicit balance reactions when reaching outside BOS.  Pt required close supervision with balance activities.  Eye patch over Lt eye during session with pt with no reports of dizziness during ambulation or balance activities.  NM re-ed with alternating eye patch while completing exercises sitting edge of mat with head still and moving eyes to moving target, head still moving eyes to fixed target, and moving eyes and head together with moving target - performing each exercise x3/eye.  Ambulated back to room and returned to recliner with call bell and urinal in reach.  Therapy Documentation Precautions:  Precautions Precautions: Fall Precaution Comments: diplopia, visual deficits   Restrictions Weight Bearing Restrictions: No General:   Vital Signs: Therapy Vitals Pulse Rate: 66 BP: 179/79 mmHg Pain: Pain Assessment Pain Assessment: 0-10 Pain Score:   5 Pain Location: Head Pain Descriptors: Aching Pain Intervention(s): Medication (See eMAR)  See FIM for current functional status  Therapy/Group: Individual Therapy  Sim Boast 03/05/2012, 10:02 AM

## 2012-03-06 ENCOUNTER — Inpatient Hospital Stay (HOSPITAL_COMMUNITY): Payer: PRIVATE HEALTH INSURANCE | Admitting: Occupational Therapy

## 2012-03-06 NOTE — Progress Notes (Signed)
Occupational Therapy Session Note  Patient Details  Name: [name] [name] MRN: [id number] Date of Birth: 1955-05-18  Today's Date: 03/06/2012 Time: 1400-1455 Time Calculation (min): 55 min  Short Term Goals: Week 1:  OT Short Term Goal 1 (Week 1): Short term goals equal to long term goals which are set at modified independent level for ADLS.  Skilled Therapeutic Interventions/Progress Updates:  Patient sleeping in bed up arrival yet willing to participate in therapy.  Transferred with supervision to recliner to engage in oculomotor  And gaze stabilization exercises while seated and alternating occlusion of eyes to better recruit the un-occluded eye.  Patient also able to demonstrate use of letter "A" on a card while performing gaze stabilization exercises with vcs for technique.  Patient continues with eye muscle weakness yet ocular movement with minimal improvement with exercises and repetition.  Provided patient with additional "A"s located around the room to use as a fixed target to use while moving in space to assist patient to feel grounded when having episodes of vertigo.  Patient reports 0/10 dizziness before ambulate with eye patch on right eye to bathroom, open door, close door behind him and sit on commode.  Once on commode, patient reports 4/10 dizziness which quickly resolved to 0/10 (~30 sec).  Ambulated with eye patch on right to therapy kitchen to explore contents of kitchen cabinets and refrigerator.  Patient stating 5/10 dizziness after task yet did not want to rest before ambulate back to room.  Patient left in recliner stating 0/10 dizziness and visiting with his sister.  Therapy Documentation Precautions:  Precautions Precautions: Fall Precaution Comments: diplopia, visual deficits  Restrictions Weight Bearing Restrictions: No Pain: Reports 3/10 pain in left eye after eye exercises, declines medication at this time stating it is bearable.  Rest breaks as needed and use of eye  patch. See FIM for current functional status  Therapy/Group: Individual Therapy  Jaryn Hocutt 03/06/2012, 5:15 PM

## 2012-03-06 NOTE — Progress Notes (Signed)
0730 IV fluids discontinued per MD order.

## 2012-03-06 NOTE — Progress Notes (Signed)
IV fluids started at [facility] per MD orders.  See MAR.

## 2012-03-06 NOTE — Progress Notes (Signed)
[name] [name] is a 57 y.o. male 09/12/1955 [id number]  Subjective: No complaints. No new problems. Feels well this AM, but tired.  Objective: Vital signs in last 24 hours: Temp:  [98.5 F (36.9 C)-98.6 F (37 C)] 98.6 F (37 C) (02/09 0600) Pulse Rate:  [60-76] 60 (02/09 0915) Resp:  [18] 18 (02/09 0915) BP: (144-175)/(81-105) 144/81 mmHg (02/09 0915) SpO2:  [98 %] 98 % (02/09 0600) Weight change:  Last BM Date: 03/04/12  Intake/Output from previous day: 02/08 0701 - 02/09 0700 In: 1220 [P.O.:1220] Out: 1850 [Urine:1850] Last cbgs: CBG (last 3)  No results found for this basename: GLUCAP,  in the last 72 hours   Physical Exam General: No apparent distress    Lungs: Normal effort. Lungs clear to auscultation, no crackles or wheezes. Cardiovascular: Regular rate and rhythm, no edema Musculoskeletal:  No gross deficits Neurological: No new neurological deficits  Lab Results: BMET    Component Value Date/Time   NA 141 03/04/2012 0630   K 3.6 03/04/2012 0630   CL 109 03/04/2012 0630   CO2 23 03/04/2012 0630   GLUCOSE 114* 03/04/2012 0630   BUN 21 03/04/2012 0630   CREATININE 1.72* 03/04/2012 0630   CALCIUM 8.8 03/04/2012 0630   GFRNONAA 42* 03/04/2012 0630   GFRAA 49* 03/04/2012 0630   CBC    Component Value Date/Time   WBC 7.3 03/01/2012 0950   RBC 4.24 03/01/2012 0950   HGB 12.8* 03/01/2012 0950   HCT 39.4 03/01/2012 0950   PLT 294 03/01/2012 0950   MCV 92.9 03/01/2012 0950   MCH 30.2 03/01/2012 0950   MCHC 32.5 03/01/2012 0950   RDW 14.6 03/01/2012 0950   LYMPHSABS 1.4 03/01/2012 0950   MONOABS 0.8 03/01/2012 0950   EOSABS 0.4 03/01/2012 0950   BASOSABS 0.1 03/01/2012 0950    Studies/Results: No results found.  Medications: I have reviewed the patient's current medications.  Assessment/Plan: Functional deficits secondary to R>L cerebellar infarcts which require 3+ hours per day of interdisciplinary therapy in a comprehensive inpatient rehab setting.  Physiatrist is providing close team  supervision and 24 hour management of active medical problems listed below.  Physiatrist and rehab team continue to assess barriers to discharge/monitor patient progress toward functional and medical goals. 1. Embolic bilateral cerebellar infarcts  2. DVT Prophylaxis/Anticoagulation: Subcutaneous heparin. Monitor platelet counts and any signs of bleeding  3. Mood/schizophrenia. Provide emotional support and positive reinforcement. Patient states he takes Abilify prior to admission although this is not documented in the old records. Will discuss further with patient  4. Neuropsych: This patient is capable of making decisions on his/her own behalf.  5. Acute renal failure in setting of rhabdomyolysis. slowly improving. Renal services following  6. Chronic systolic and diastolic heart failure. Latest chest x-ray no active disease. Continue Lasix therapy. Followup per cardiology services as needed  7. Hypertension. Uncontrolled on Coreg 6.25 mg twice a day, Bidil 20-37.5 mg 3 times a day. Added prn hydralazine q6h. Monitor with increased activity 8. Alcohol/polysubstance abuse. Monitor for signs of withdrawal and provide counseling    Length of stay, days: 6  Gwendolyn Grant , MD  03/06/2012, 9:26 AM

## 2012-03-07 ENCOUNTER — Inpatient Hospital Stay (HOSPITAL_COMMUNITY): Payer: PRIVATE HEALTH INSURANCE | Admitting: *Deleted

## 2012-03-07 ENCOUNTER — Inpatient Hospital Stay (HOSPITAL_COMMUNITY): Payer: PRIVATE HEALTH INSURANCE | Admitting: Occupational Therapy

## 2012-03-07 DIAGNOSIS — I10 Essential (primary) hypertension: Secondary | ICD-10-CM

## 2012-03-07 DIAGNOSIS — I69993 Ataxia following unspecified cerebrovascular disease: Secondary | ICD-10-CM

## 2012-03-07 DIAGNOSIS — I6340 Cerebral infarction due to embolism of unspecified cerebral artery: Secondary | ICD-10-CM

## 2012-03-07 LAB — BASIC METABOLIC PANEL
CO2: 23 mEq/L (ref 19–32)
Calcium: 9.4 mg/dL (ref 8.4–10.5)
Chloride: 104 mEq/L (ref 96–112)
Glucose, Bld: 94 mg/dL (ref 70–99)
Potassium: 3.8 mEq/L (ref 3.5–5.1)
Sodium: 138 mEq/L (ref 135–145)

## 2012-03-07 MED ORDER — TRAMADOL HCL 50 MG PO TABS
50.0000 mg | ORAL_TABLET | Freq: Four times a day (QID) | ORAL | Status: DC
  Administered 2012-03-07 – 2012-03-08 (×4): 50 mg via ORAL
  Filled 2012-03-07 (×8): qty 1

## 2012-03-07 NOTE — Progress Notes (Signed)
Patient ID: [name] [name], male   DOB: 08/04/1955, 57 y.o.   MRN: [id number]  Subjective/Complaints: 57 y.o. right-handed male with history of hypertension, schizophrenia polysubstance abuse and hepatitis C. Patient lives alone in independent prior to admission. Admitted 02/20/2012 with altered mental status, left-sided numbness and slurred speech. MRI of the brain showed small acute cerebellar infarcts right greater than left as well as questionable acute left external capsule and caudate nuclear punctate lacunar infarct. Echocardiogram with ejection fraction of 30% with diffuse hypokinesis. Patient did not receive TPA. TEE completed showing PFO with right to left shunt, ejection fraction 30%. Lower extremity Dopplers showed no signs of DVT. Neurology services was consulted and presently maintained on aspirin therapy for stroke prophylaxis. Noted admission creatinine of 2.34 with latest check creatinine 1.2 02/08/2012 elevated to 9.54 and renal services has been consulted. Acute renal failure likely multi-factorial from ATN from hypotension. Placed on intravenous Lasix and monitored. Patient did not receive hemodialysis with latest creatinine 4.37 showing good improvement as he continues maintained on intravenous fluids and Lasix. Urine drug screen upon admission was negative noted alcohol level 105. Findings of elevated liver function studies upon admission was noted reports of nausea and vomiting over the past week prior to admission. Abdominal ultrasound showed dense slightly heterogeneous liver with morphologic suggestive of cirrhosis. Latest liver function panel 02/24/2012 with steady improvement. Followup cardiology services for findings of chronic systolic and diastolic heart failure with mild elevation in troponins along with rhabdomyolysis. 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 Not a candidate for ACE inhibitors or ARB due to  acute renal failure Slept ok, appetite good LLE Pain and  swelling  Review of Systems  Eyes: Positive for double vision.  All other systems reviewed and are negative.     Objective: Vital Signs: Blood pressure 147/66, pulse 92, temperature 97.3 F (36.3 C), temperature source Oral, resp. rate 16, height 5\' 11"  (1.803 m), weight 120.5 kg (265 lb 10.5 oz), SpO2 99.00%. No results found. No results found for this or any previous visit (from the past 72 hour(s)).   HEENT: normal and Strabismus R eye with lateral deviation, can adduct, L eye cannot adduct or abduct Cardio: RRR Resp: CTA B/L GI: BS positive Extremity:  Pulses positive and No Edema Skin:   Intact Neuro: Alert/Oriented, Flat, Cranial Nerve Abnormalities L CN 6 palsy, Normal Sensory, Normal Motor and Abnormal FMC Ataxic/ dec FMC Musc/Skel:  Normal Gen NAD Pass pointing L FNF   Assessment/Plan: 1. Functional deficits secondary to R>L cerebellar infarcts which require 3+ hours per day of interdisciplinary therapy in a comprehensive inpatient rehab setting. Physiatrist is providing close team supervision and 24 hour management of active medical problems listed below. Physiatrist and rehab team continue to assess barriers to discharge/monitor patient progress toward functional and medical goals. FIM: FIM - Bathing Bathing Steps Patient Completed: Chest;Right Arm;Left Arm;Abdomen;Front perineal area;Buttocks;Right upper leg;Left upper leg;Right lower leg (including foot);Left lower leg (including foot) Bathing: 5: Supervision: Safety issues/verbal cues  FIM - Upper Body Dressing/Undressing Upper body dressing/undressing steps patient completed: Thread/unthread right sleeve of pullover shirt/dresss;Pull shirt over trunk;Put head through opening of pull over shirt/dress;Thread/unthread left sleeve of pullover shirt/dress Upper body dressing/undressing: 5: Supervision: Safety issues/verbal cues FIM - Lower Body Dressing/Undressing Lower body dressing/undressing steps patient  completed: Thread/unthread right underwear leg;Thread/unthread left underwear leg;Pull underwear up/down;Thread/unthread right pants leg;Thread/unthread left pants leg;Pull pants up/down;Fasten/unfasten pants;Don/Doff right sock;Don/Doff left sock;Don/Doff right shoe;Don/Doff left shoe Lower body dressing/undressing: 5: Supervision: Safety issues/verbal cues  FIM -  Toileting Toileting steps completed by patient: Adjust clothing prior to toileting;Adjust clothing after toileting;Performs perineal hygiene Toileting Assistive Devices: Grab bar or rail for support Toileting: 5: Supervision: Safety issues/verbal cues  FIM - Radio producer Devices: Elevated toilet seat Toilet Transfers: 5-To toilet/BSC: Supervision (verbal cues/safety issues);5-From toilet/BSC: Supervision (verbal cues/safety issues)  FIM - Engineer, site Assistive Devices: Arm rests Bed/Chair Transfer: 5: Bed > Chair or W/C: Supervision (verbal cues/safety issues)  FIM - Locomotion: Wheelchair Distance: 0 Locomotion: Wheelchair: 0: Activity did not occur FIM - Locomotion: Ambulation Locomotion: Ambulation Assistive Devices: Administrator Ambulation/Gait Assistance: 5: Supervision Locomotion: Ambulation: 5: Travels 150 ft or more with supervision/safety issues  Comprehension Comprehension Mode: Auditory Comprehension: 6-Follows complex conversation/direction: With extra time/assistive device  Expression Expression Mode: Verbal Expression: 6-Expresses complex ideas: With extra time/assistive device  Social Interaction Social Interaction: 7-Interacts appropriately with others - No medications needed.  Problem Solving Problem Solving: 6-Solves complex problems: With extra time  Memory Memory: 7-Complete Independence: No helper  Medical Problem List and Plan:  1. Embolic bilateral cerebellar infarcts  2. DVT Prophylaxis/Anticoagulation: Subcutaneous heparin.  Monitor platelet counts and any signs of bleeding  3. Mood/schizophrenia. Provide emotional support and positive reinforcement. Patient states he takes Abilify prior to admission although this is not documented in the old records. We'll discuss further with patient  4. Neuropsych: This patient is capable of making decisions on his/her own behalf.  5. Acute renal failure in setting of rhabdomyolysis.slowly improving Felt to be multi-factorial from ATN/hypotension. Currently remains on IV fluids 50 cc an hour and Lasix to be changed to 40 mg by mouth twice a day. Will change IV to .45 NS at 70cc/hr 7P to 7AWeight patient daily followup chemistries with latest creatinine 2.96. Repeat today Renal services following  6. Chronic systolic and diastolic heart failure. Latest chest x-ray no active disease. Continue Lasix therapy. Followup per cardiology services as needed  7. Hypertension. Coreg 6.25 mg twice a day, Bidil 20-37.5 mg 3 times a day. Monitor with increased activity, increasing, will reduce IVF, may need to increase bidil 8. Alcohol/polysubstance abuse. Monitor for signs of withdrawal and provide counseling  9.  Left foot swelling, thigh and calf tenderness r/o DVT will order doppler, bedrest with bedside PT/OT until; results reviewed by MD/PA   LOS (Days) 7 A FACE TO FACE EVALUATION WAS PERFORMED  Lasundra Hascall E 03/07/2012, 8:39 AM

## 2012-03-07 NOTE — Progress Notes (Signed)
Physical Therapy Session Note  Patient Details  Name: [name] [name] MRN: [id number] Date of Birth: August 06, 1955  Today's Date: 03/07/2012 Time: 0930-1015 and 1505-1600 Time Calculation (min): 45 min and 55 min  Short Term Goals: Week 1:  PT Short Term Goal 1 (Week 1): STGs=LTGs secondary to short LOS  Skilled Therapeutic Interventions/Progress Updates:    AM session: Patient received sitting in recliner. Today's session focused on overall functional mobility, gait training/stair training with gaze stabilization exercises, and balance/coordination. Patient performed gait training on various surfaces (tile and carpet) >300' x2 and 160' x1 with supervision for safety cues, see details below.  Patient performed floor transfer with supervision, requiring verbal cues only for proper sequencing when transferring to the floor. Once on the floor, patient able to perform floor transfer without any cues for sequencing/technique. Patient performed car transfer with supervision using technique of single leg stance on R LE to get into car. Patient educated on another method for car transfer of turning and standing on B LEs then sitting down to car seat and bringing B LEs into car. Patient educated that this is less challenging to his balance. Patient verbalized understanding. Patient performed curb negotiation x6 with supervision. Patient practiced sit>stand from low positioned, cushion couch with supervision.  Patient instructed in gait training 25'x2 with supervision while bouncing ball to improve balance and coordination.   Patient returned to room and left seated in recliner with all needs within reach.  PM Session: Patient received sitting in recliner. Patient ambulated to bathroom and performed all aspects of toileting with supervision. Today's session focused on high level mat mobility and high level balance training. Patient performed modified plank (prone on elbows with hips elevated) x12" before  needing rest break. Patient performed one more time x7" before he c/o pain in abdomen near injection site. Patient performed modified R side plank (sidelying on R elbow and knees stacked with hips elevated) x16" before needing rest break. Patient performed 5x5" hold of modified R side plank. Patient performed modified L side plank (sidelying on L elbow and knees stacked with hips elevated) x13" before needing rest break. Patient performed 5x5" hold of modified L side plank. Patient performed half kneeling with R LE forward to increase weight bearing through R LE. Patient utilized small bench for L UE support. Patient with difficulty achieving upright posture and B shoulder retraction during exercise. Patient maintained position for 40" with stand by assistance, but c/o back pain and exercise discontinued.  Patient performed ball toss on rebounder with 6" medicine ball with normal stance x20 reps. Patient requires min guard for activity and min assist for first 3 tosses as patient's ability to track ball adjusts. Patient able to catch ball all 20 times. Second round of ball toss x20 performed with feet together, patient requires min guard and able to catch ball all 20 times. Patient performed standing on foam wedge (positioned so patient with increased B ankle DF) to promote ankle and hip strategies. Patient performed modified tandem stance (leading with each foot) 2x20" with supervision. Patient performed single leg stance 4x 3-5" on each LE, requiring supervision to intermittent min guard for balance.  Patient performed alternating B LE foot taps on 6" step, x20 reps with supervision; patient did not experience any overt LOB. Patient performed single leg step ups x10 each leg with min assist, no LOB. Patient performed bed mobility on flat bed without bed rails with independence.  Patient returned to room and left seated in recliner with all  needs within reach.  Therapy Documentation Precautions:   Precautions Precautions: Fall Precaution Comments: diplopia, visual deficits Restrictions Weight Bearing Restrictions: No Pain: Pain Assessment Pain Assessment: No/denies pain Pain Score: 0-No pain Locomotion : Ambulation Ambulation: Yes Ambulation/Gait Assistance: 5: Supervision Ambulation Distance (Feet): >300 Feet x2, 160' x2 Assistive device: None Ambulation/Gait Assistance Details: Verbal cues for gait pattern Gait Gait: Yes Gait Pattern: Impaired Gait Pattern: Step-through pattern;Trunk flexed;Wide base of support;Decreased weight shift to left;Decreased stride length;Decreased stance time - left;Decreased stance time - right Stairs / Additional Locomotion Stairs: Yes Stairs Assistance: 5: Supervision Stairs Assistance Details: Verbal cues for technique Stair Management Technique: One rail Right;One rail Left;Forwards Number of Stairs: 12 Height of Stairs: 6 Curb: 5: Psychiatric nurse: No Distance: 0  Balance: Balance Balance Assessed: Yes Standardized Balance Assessment Standardized Balance Assessment: Dynamic Gait Index Dynamic Gait Index Level Surface: Mild Impairment Change in Gait Speed: Mild Impairment Gait with Horizontal Head Turns: Mild Impairment Gait with Vertical Head Turns: Moderate Impairment Gait and Pivot Turn: Mild Impairment Step Over Obstacle: Mild Impairment Step Around Obstacles: Mild Impairment Steps: Mild Impairment Total Score: 15/24, score <19 indicating greater fall risk.  See FIM for current functional status  Therapy/Group: Individual Therapy  [name] Abed. Sundance [name], PT, DPT  03/07/2012, 10:38 AM

## 2012-03-07 NOTE — Progress Notes (Signed)
Social Work Patient ID: [name] [name], male   DOB: 1955/12/14, 57 y.o.   MRN: [id number] Met with pt to discuss discharge needs, he has no DME needs according to team.  They do recommend [facility]. He reports he can get transportation to [facility] and is aware this worker will set up pt's first appointment.  He is pleased with progress, Still hopeful his vision will continue to get better.  Have contacted [facility] service and they will resume services once discharged.

## 2012-03-07 NOTE — Progress Notes (Signed)
Called to pt room reported "had to catch breath at rest", reported happened yesterday and oxygen level was ok. Concerned he needs oxygen.  Reviewed parameters rationale for oxygen use and checked VS. Dan Zimbabwe, PA notified of event and report. Margarito Liner

## 2012-03-07 NOTE — Progress Notes (Signed)
Occupational Therapy Session Note  Patient Details  Name: [name] [name] MRN: [id number] Date of Birth: [birth year]  Today's Date: 03/07/2012 Time: 0802-0859 Time Calculation (min): 57 min  Second Session: Time: 11:03-11:30 Time Calculation (min): 27 mins  Short Term Goals: Week 1:  OT Short Term Goal 1 (Week 1): Short term goals equal to long term goals which are set at modified independent level for ADLS.  Skilled Therapeutic Interventions/Progress Updates:    Session 1:  Pt able to gather all items needed with increased time and without assistive device for bathing, dressing, and grooming session.  Performed sponge bath at the sink, per pt's choice.  Overall close supervision for all tasks.  No LOB noted.  Did require assist with donning medium sized TEDs to help decrease swelling in his feet.  Still reports some dizziness with mobility but does not report that it is limiting.  Ambulated to the gym and used the Nustep for 2 sets of 5 mins with resistance set on level 7.  Pt averaging 47- 49 steps per minute.  Needed 2-3 min rest break between sets.  Session 2:  Worked on vision exercises and gaze stabilization exercise.  First performed visual tracking with each eye patched, with tracking in all directions.  Progressed to bilateral tracking.  Pt still exhibiting decreased occular ROM in all quadrants in the left eye and overshooting in the right eye.  Also with report of diplopia in the left lateral visual field.  Progressed to VOR exercises with pt turning his head in different directions but keeping his eyes on a fixed target.  Performed these in all directions as well.  Pt with increased difficulty maintaining either eye on the target.  Finished having pt working on saccadic movement, looking from one target to the other in different directions but spaced 16 inches apart.    Therapy Documentation Precautions:  Precautions Precautions: Fall Precaution Comments: diplopia, visual  deficits Restrictions Weight Bearing Restrictions: No  Pain: Pain Assessment Pain Assessment: No/denies pain Pain Score: 0-No pain ADL: See FIM for current functional status  Therapy/Group: Individual Therapy  Aviyah Swetz OTR/L 03/07/2012, 11:50 AM

## 2012-03-08 ENCOUNTER — Inpatient Hospital Stay (HOSPITAL_COMMUNITY): Payer: PRIVATE HEALTH INSURANCE | Admitting: *Deleted

## 2012-03-08 ENCOUNTER — Encounter (HOSPITAL_COMMUNITY): Payer: PRIVATE HEALTH INSURANCE | Admitting: Occupational Therapy

## 2012-03-08 ENCOUNTER — Inpatient Hospital Stay (HOSPITAL_COMMUNITY): Payer: PRIVATE HEALTH INSURANCE | Admitting: Occupational Therapy

## 2012-03-08 DIAGNOSIS — I69993 Ataxia following unspecified cerebrovascular disease: Secondary | ICD-10-CM

## 2012-03-08 DIAGNOSIS — I10 Essential (primary) hypertension: Secondary | ICD-10-CM

## 2012-03-08 DIAGNOSIS — I6340 Cerebral infarction due to embolism of unspecified cerebral artery: Secondary | ICD-10-CM

## 2012-03-08 MED ORDER — ADULT MULTIVITAMIN W/MINERALS CH: 1.0000 | ORAL_TABLET | Freq: Every day | ORAL | Status: DC

## 2012-03-08 MED ORDER — TRAMADOL HCL 50 MG PO TABS: 50.0000 mg | ORAL_TABLET | Freq: Four times a day (QID) | ORAL | Status: DC | PRN

## 2012-03-08 MED ORDER — MAGNESIUM OXIDE 400 (241.3 MG) MG PO TABS: 200.0000 mg | ORAL_TABLET | Freq: Two times a day (BID) | ORAL | Status: DC

## 2012-03-08 MED ORDER — ASPIRIN 325 MG PO TABS: 325.0000 mg | ORAL_TABLET | Freq: Every day | ORAL | Status: DC

## 2012-03-08 MED ORDER — FOLIC ACID 1 MG PO TABS: 1.0000 mg | ORAL_TABLET | Freq: Every day | ORAL | Status: DC

## 2012-03-08 MED ORDER — CARVEDILOL 6.25 MG PO TABS: 6.2500 mg | ORAL_TABLET | Freq: Two times a day (BID) | ORAL | Status: DC

## 2012-03-08 MED ORDER — ISOSORB DINITRATE-HYDRALAZINE 20-37.5 MG PO TABS: 1.0000 | ORAL_TABLET | Freq: Three times a day (TID) | ORAL | Status: DC

## 2012-03-08 NOTE — Progress Notes (Signed)
Occupational Therapy Session Note  Patient Details  Name: [name] [name] MRN: [id number] Date of Birth: 02/28/1955  Today's Date: 03/08/2012 Time: 0800-0858 Time Calculation (min): 58 min  Short Term Goals: Week 1:  OT Short Term Goal 1 (Week 1): Short term goals equal to long term goals which are set at modified independent level for ADLS.  Skilled Therapeutic Interventions/Progress Updates:    Worked on bathing and dressing at sink level per pt's choice.  Pt able to perform all aspects of bathing and dressing at a modified independent level, including gathering all supplies and cleaning up after he was finished.  Also walked down to the tub shower room without assistive device and he is able to step over the edge of the tub modified independently,holding onto the wall of the shower.  Discussed the need for a shower seat, which he already has from his previous back surgeries.  He also plans to possibly get a hand held shower as well.    Therapy Documentation Precautions:  Precautions Precautions: None Precaution Comments: diplopia, visual deficits Restrictions Weight Bearing Restrictions: No General:   Vital Signs: Therapy Vitals Pulse Rate: 83 Oxygen Therapy SpO2: 95 % Pulse Oximetry Type: Intermittent Pain: Pain Assessment Pain Assessment: No/denies pain Pain Score: 0-No pain Pain Type: Acute pain Pain Location: Head Pain Orientation: Posterior Pain Descriptors: Headache Pain Frequency: Intermittent Pain Onset: Gradual Patients Stated Pain Goal: 3 Pain Intervention(s): Medication (See eMAR);Repositioned Multiple Pain Sites: No ADL: See FIM for current functional status  Therapy/Group: Individual Therapy  Kennetta Pavlovic OTR/L  03/08/2012, 10:37 AM

## 2012-03-08 NOTE — Discharge Summary (Signed)
  Discharge summary job # [id number]

## 2012-03-08 NOTE — Discharge Summary (Signed)
NAME:  [name] [name], [name] [name] NO.:  [id number]  MEDICAL RECORD NO.:  [id number]  LOCATION:  [id number]                         FACILITY:  [facility]  PHYSICIAN:  Charlett Blake, M.D.DATE OF BIRTH:  15-Feb-1955  DATE OF ADMISSION:  02/29/2012 DATE OF DISCHARGE:  03/08/2012                              DISCHARGE SUMMARY   DISCHARGE DIAGNOSES: 1. Embolic bilateral cerebellar infarcts. 2. Subcutaneous heparin for deep venous thrombosis prophylaxis. 3. Mood-schizophrenia. 4. Acute renal failure, resolved. 5. Chronic systolic and diastolic heart failure. 6. Hypertension. 7. History of alcohol, polysubstance abuse.  HISTORY OF PRESENT ILLNESS:  A 57 year old right-handed male with history of schizophrenia, hypertension who lives alone.  Admitted on February 20, 2012, with altered mental status, left-sided numbness and slurred speech.  MRI of the brain showed small acute cerebellar infarcts right greater than left as well as questionable acute left external capsule and caudate nucleus punctate lacunar infarct.  Echocardiogram with ejection fraction of 30% with diffuse hypokinesis.  The patient did not receive tPA.  TEE completed, showing PFO with right-to-left shunt, ejection fraction 30%.  Lower extremity Doppler is negative for DVT. Neurology Service was consulted, maintained on aspirin therapy.  Noted creatinine elevation 2.34 to 9.54.  Renal Service consulted, felt to be multifactorial from ATN and hypotension.  Placed on intravenous Lasix and monitored, did not receive hemodialysis, creatinine continued to improve.  Urine drug screen was negative; however, alcohol level 105. Mild elevation of liver function studies and monitored.  Abdominal ultrasound showed dense heterogeneous liver suggestive of cirrhosis. Latest liver function continued to improve.  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  He was not a candidate for ACE inhibitors or ARB due to the acute renal failure and monitored. The patient was admitted for comprehensive rehab program.  PAST MEDICAL HISTORY:  See discharge diagnoses.  SOCIAL HISTORY:  Lives alone.  He has friends to check on him.  One- level home.  FUNCTIONAL HISTORY PRIOR TO ADMISSION:  Independent, unemployed.  FUNCTIONAL STATUS UPON ADMISSION TO REHAB SERVICES:  Minimal assist to ambulate 160 feet the rolling walker.  PHYSICAL EXAMINATION:  VITAL SIGNS:  Blood pressure 138/74, pulse 58, temperature 98, respirations 18. GENERAL:  This was an alert male, no acute distress. LUNGS:  Clear to auscultation. CARDIAC:  Regular rate and rhythm. ABDOMEN:  Soft, nontender.  Good bowel sounds. NEUROLOGIC:  He was able to name person, place, and situation.  REHABILITATION HOSPITAL COURSE:  The patient was admitted to Inpatient Rehab Services.  Therapies were initiated on a 3-hour daily basis consisting of physical and occupational therapy.  The patient remained on aspirin therapy for embolic bilateral cerebellar infarcts and would follow up with Dr. Letta Pate at the [facility]. Subcutaneous heparin for DVT prophylaxis.  His acute renal failure continued to improve.  His intravenous fluids were later discontinued. Renal function essentially within normal limits.  He exhibited no signs of fluid overload.  Blood pressures remained well controlled.  He did have a history of alcohol, polysubstance abuse.  Receive counseling in regards to cessation of nicotine as well as  alcohol.  It was questionable if he will be compliant with these request.  The patient received weekly collaborative interdisciplinary team conferences to discuss estimated length of stay, family teaching, and any barriers to discharge.  He was continent of bowel and bladder.  Currently supervision  for upper body self-care, minimal guard to minimal assist for lower body self-care and toileting.  He has some mild diplopia, continued to improve.  Overall, minimal guard for mobility.  Plan is to be discharged to home with his sister with family teaching completed. Ongoing therapy as dictated per [employer].  DISCHARGE MEDICATIONS: 1. Aspirin 325 p.o. daily. 2. Coreg 123XX123 mg b.i.d. 3. Folic acid 1 mg daily. 4. BiDil 20-37.5 mg t.i.d. 5. Magnesium oxide 200 mg b.i.d. 6. Multivitamin daily. 7. Ultram 50 mg every 6 hours as needed for headache.  DIET:  Regular.  SPECIAL INSTRUCTIONS:  The patient would follow up with Dr. Alysia Penna at the [facility] on March 29, 2012, arrival at 10:15 a.m.  Dr. Antony Contras, Neurology Services, the patient to call for appointment in 1 month.  Arrangements to be made to follow up at the [facility] and Dr. Jonnie Finner, Renal Services, call for appointment.     Lauraine Rinne, P.A.   ______________________________ Charlett Blake, M.D.    DA/MEDQ  D:  03/08/2012  T:  03/08/2012  Job:  [id number]  cc:   Pramod P. Leonie Man, MD

## 2012-03-08 NOTE — Progress Notes (Signed)
Patient ID: Staton Byfield, male   DOB: 08/07/1955, 57 y.o.   MRN: [id number]  Subjective/Complaints: 57 y.o. right-handed male with history of hypertension, schizophrenia polysubstance abuse and hepatitis C. Patient lives alone in independent prior to admission. Admitted 02/20/2012 with altered mental status, left-sided numbness and slurred speech. MRI of the brain showed small acute cerebellar infarcts right greater than left as well as questionable acute left external capsule and caudate nuclear punctate lacunar infarct. Echocardiogram with ejection fraction of 30% with diffuse hypokinesis. Patient did not receive TPA. TEE completed showing PFO with right to left shunt, ejection fraction 30%. Lower extremity Dopplers showed no signs of DVT. Neurology services was consulted and presently maintained on aspirin therapy for stroke prophylaxis. Noted admission creatinine of 2.34 with latest check creatinine 1.2 02/08/2012 elevated to 9.54 and renal services has been consulted. Acute renal failure likely multi-factorial from ATN from hypotension. Placed on intravenous Lasix and monitored. Patient did not receive hemodialysis with latest creatinine 4.37 showing good improvement as he continues maintained on intravenous fluids and Lasix. Urine drug screen upon admission was negative noted alcohol level 105. Findings of elevated liver function studies upon admission was noted reports of nausea and vomiting over the past week prior to admission. Abdominal ultrasound showed dense slightly heterogeneous liver with morphologic suggestive of cirrhosis. Latest liver function panel 02/24/2012 with steady improvement. Followup cardiology services for findings of chronic systolic and diastolic heart failure with mild elevation in troponins along with rhabdomyolysis. 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 Not a candidate for ACE inhibitors or ARB due to  acute renal failure Slept ok, appetite good Headaches  better Review of Systems  Eyes: Positive for double vision.  All other systems reviewed and are negative.     Objective: Vital Signs: Blood pressure 148/77, pulse 63, temperature 97.7 F (36.5 C), temperature source Oral, resp. rate 20, height 5\' 11"  (1.803 m), weight 120.5 kg (265 lb 10.5 oz), SpO2 99.00%. No results found. Results for orders placed during the hospital encounter of 02/29/12 (from the past 72 hour(s))  BASIC METABOLIC PANEL     Status: Abnormal   Collection Time    03/07/12  7:55 AM      Result Value Range   Sodium 138  135 - 145 mEq/L   Potassium 3.8  3.5 - 5.1 mEq/L   Chloride 104  96 - 112 mEq/L   CO2 23  19 - 32 mEq/L   Glucose, Bld 94  70 - 99 mg/dL   BUN 20  6 - 23 mg/dL   Creatinine, Ser 1.42 (*) 0.50 - 1.35 mg/dL   Calcium 9.4  8.4 - 10.5 mg/dL   GFR calc non Af Amer 53 (*) >90 mL/min   GFR calc Af Amer 61 (*) >90 mL/min   Comment:            The eGFR has been calculated     using the CKD EPI equation.     This calculation has not been     validated in all clinical     situations.     eGFR's persistently     <90 mL/min signify     possible Chronic Kidney Disease.     HEENT: normal and Strabismus R eye with lateral deviation, can adduct, L eye cannot adduct or abduct Cardio: RRR Resp: CTA B/L GI: BS positive Extremity:  Pulses positive and No Edema Skin:   Intact Neuro: Alert/Oriented, Flat, Cranial Nerve Abnormalities L CN 6 palsy, Normal Sensory, Normal Motor  and Abnormal FMC Ataxic/ dec FMC Musc/Skel:  Normal Gen NAD Pass pointing L FNF   Assessment/Plan: 1. Functional deficits secondary to R>L cerebellar infarcts which require 3+ hours per day of interdisciplinary therapy in a comprehensive inpatient rehab setting. Physiatrist is providing close team supervision and 24 hour management of active medical problems listed below. Physiatrist and rehab team continue to assess barriers to discharge/monitor patient progress toward functional  and medical goals. FIM: FIM - Bathing Bathing Steps Patient Completed: Chest;Right Arm;Left Arm;Abdomen;Front perineal area;Buttocks;Right upper leg;Left upper leg;Right lower leg (including foot);Left lower leg (including foot) Bathing: 5: Supervision: Safety issues/verbal cues  FIM - Upper Body Dressing/Undressing Upper body dressing/undressing steps patient completed: Thread/unthread right sleeve of pullover shirt/dresss;Pull shirt over trunk;Put head through opening of pull over shirt/dress;Thread/unthread left sleeve of pullover shirt/dress Upper body dressing/undressing: 5: Supervision: Safety issues/verbal cues FIM - Lower Body Dressing/Undressing Lower body dressing/undressing steps patient completed: Thread/unthread right underwear leg;Thread/unthread left underwear leg;Pull underwear up/down;Thread/unthread right pants leg;Thread/unthread left pants leg;Pull pants up/down;Fasten/unfasten pants;Don/Doff right sock;Don/Doff left sock;Don/Doff right shoe;Don/Doff left shoe Lower body dressing/undressing: 5: Supervision: Safety issues/verbal cues  FIM - Toileting Toileting steps completed by patient: Adjust clothing prior to toileting;Adjust clothing after toileting;Performs perineal hygiene Toileting Assistive Devices: Grab bar or rail for support Toileting: 5: Supervision: Safety issues/verbal cues  FIM - Radio producer Devices: Elevated toilet seat Toilet Transfers: 5-To toilet/BSC: Supervision (verbal cues/safety issues);5-From toilet/BSC: Supervision (verbal cues/safety issues)  FIM - Engineer, site Assistive Devices: Arm rests Bed/Chair Transfer: 5: Bed > Chair or W/C: Supervision (verbal cues/safety issues);5: Chair or W/C > Bed: Supervision (verbal cues/safety issues)  FIM - Locomotion: Wheelchair Distance: 0 Locomotion: Wheelchair: 0: Activity did not occur FIM - Locomotion: Ambulation Locomotion: Ambulation Assistive  Devices: Other (comment) (none) Ambulation/Gait Assistance: 5: Supervision Locomotion: Ambulation: 5: Travels 150 ft or more with supervision/safety issues  Comprehension Comprehension Mode: Auditory Comprehension: 5-Understands complex 90% of the time/Cues < 10% of the time  Expression Expression Mode: Verbal Expression: 6-Expresses complex ideas: With extra time/assistive device  Social Interaction Social Interaction: 6-Interacts appropriately with others with medication or extra time (anti-anxiety, antidepressant).  Problem Solving Problem Solving: 6-Solves complex problems: With extra time  Memory Memory: 7-Complete Independence: No helper  Medical Problem List and Plan:  1. Embolic bilateral cerebellar infarcts  2. DVT Prophylaxis/Anticoagulation: Subcutaneous heparin. Monitor platelet counts and any signs of bleeding  3. Mood/schizophrenia. Provide emotional support and positive reinforcement. Patient states he takes Abilify prior to admission although this is not documented in the old records. We'll discuss further with patient  4. Neuropsych: This patient is capable of making decisions on his/her own behalf.  5. Acute renal failure in setting of rhabdomyolysis.slowly improving Felt to be multi-factorial from ATN/hypotension. Off IV fluids Lasix to be changed to 40 mg by mouth twice a day. Weight patient daily followup chemistries with latest creatinine 1.4.6. Chronic systolic and diastolic heart failure. Latest chest x-ray no active disease. Continue Lasix therapy. Followup per cardiology services as needed  7. Hypertension. Coreg 6.25 mg twice a day, Bidil 20-37.5 mg 3 times a day. 8. Alcohol/polysubstance abuse. Monitor for signs of withdrawal and provide counseling  9.  Left foot swelling, thigh and calf tenderness Doppler negative x 2 10.  Headache related to diplopia well managed by tramadol scheduled q 6  LOS (Days) 8 A FACE TO FACE EVALUATION WAS  PERFORMED  KIRSTEINS,ANDREW E 03/08/2012, 8:22 AM

## 2012-03-08 NOTE — Progress Notes (Signed)
Social Work Patient ID: [name] [name], male   DOB: 1955/03/22, 57 y.o.   MRN: [id number] Met with pt and team feels pt ready for discharge today, plan to discharge later this afternoon.  MD agreeable and pt wants to get home before the weather starts. Contact [facility] and DSS worker to make aware of discharge date day earlier.Marland Kitchen

## 2012-03-08 NOTE — Progress Notes (Signed)
Occupational Therapy Discharge Summary  Patient Details  Name: [name] [name] MRN: [id number] Date of Birth: [birth year]  Today's Date: 03/08/2012 Time: [id number] Time Calculation (min): 34 min  Session Note:  Pt performed visual tracking exercises as well as gaze stabilization exercises.  Also provided home program handouts on saccade exercises and VOR X1 exercises for gaze stabilization.  First performed visual tracking with each eye patched, with tracking in all directions. Progressed to bilateral tracking. Pt still exhibiting decreased occular ROM in all quadrants in the left eye and overshooting in the right eye. Also with report of diplopia in the left lateral visual field. Progressed to VOR exercises with pt turning his head in different directions but keeping his eyes on a fixed target. Performed these in all directions as well. Pt with increased difficulty maintaining either eye on the target. Finished having pt working on saccadic movement, looking from one target to the other in different directions but spaced 16 inches apart.   Patient has met 12 of 12 long term goals due to improved balance, postural control, ability to compensate for deficits and functional use of  RIGHT upper extremity.  Patient to discharge at overall Modified Independent level.  Pt will have daily assistance from an aide for IADLS but will be alone most of the time. Reasons goals not met: NA  Recommendation:  Patient will benefit from ongoing skilled OT services in outpatient setting to continue to advance functional skills in the area of BADL. Pt still with significant vision issues and some dizziness related to central vestibular issues.  Will benefit from continued OT to help increase efficiency of occular movements and scanning to help decrease diplopia and increase visual scanning.  Equipment: No equipment provided  Reasons for discharge: treatment goals met and discharge from hospital  Patient/family  agrees with progress made and goals achieved: Yes  OT Discharge Precautions/Restrictions  Precautions Precautions: Other (comment) Precaution Comments: diplopia, visual deficits Restrictions Weight Bearing Restrictions: No Vital Signs Therapy Vitals Temp: 97.7 F (36.5 C) Temp src: Oral Pulse Rate: 83 Resp: 20 BP: 148/77 mmHg Oxygen Therapy SpO2: 95 % O2 Device: None (Room air) Pulse Oximetry Type: Intermittent Pain Pain Assessment Pain Assessment: No/denies pain Pain Score: Asleep Pain Type: Acute pain Pain Location: Head Pain Orientation: Posterior Pain Descriptors: Headache Pain Intervention(s): Medication (See eMAR) ADL  See Fim Scale, pt modified independent for all selfcare tasks and functional transfers Vision/Perception  Vision - History Baseline Vision: Other (comment) (Pt reports catarct rmoved on his right eye and lazy eye.) Visual History: Cataracts Patient Visual Report: Blurring of vision Vision - Assessment Eye Alignment: Impaired (comment) (right eye sits superior and lateral compared to the left eye) Ocular Range of Motion: Restricted on the left Alignment/Gaze Preference: Other (comment) (right eye sits lateral and superior at rest) Tracking/Visual Pursuits: Left eye does not track medially;Left eye does not track laterally;Decreased smoothness of vertical tracking;Decreased smoothness of eye movement to RIGHT superior field;Decreased smoothness of eye movement to LEFT superior field Additional Comments: Pt with the right eye resting in exotrophic position. Can move in all directions with tracing but is not smooth. In the left eye he demonstrates decreased occular ROM and tracking in all quadrants. Lost fixation with overall tracking. Pt reports diplopia in the far left and right visual fields but not in central field. Right eye tracks medially to follow object but will come off of the object once the object is moved. If the left eye is covered up the right  eye will move from exotrophis to more midline  Demonstrates decreased VOR as well with hrizontal head movement and vertical movement. Perception Perception: Within Functional Limits Praxis Praxis: Intact  Cognition Overall Cognitive Status: Appears within functional limits for tasks assessed Arousal/Alertness: Awake/alert Orientation Level: Oriented X4 Attention: Selective Sustained Attention: Appears intact Selective Attention: Appears intact Memory: Appears intact Awareness: Appears intact Problem Solving: Appears intact Safety/Judgment: Appears intact Sensation Sensation Light Touch: Impaired Detail Light Touch Impaired Details: Impaired RUE (slight numbness in the left forearm) Stereognosis: Appears Intact Proprioception: Appears Intact Coordination Gross Motor Movements are Fluid and Coordinated: Yes Fine Motor Movements are Fluid and Coordinated: No Coordination and Movement Description: Slight decreased FM manipulation in the hands bilaterally.  Pt reports difficulty with tying and shoes and occasionally buttoning buttons. Motor  Motor Motor: Abnormal postural alignment and control Motor - Discharge Observations: Pt with flexed lumbar region secondary to back surgeries. Mobility  Bed Mobility Bed Mobility: Supine to Sit Supine to Sit: 7: Independent Transfers Sit to Stand: 6: Modified independent (Device/Increase time);With upper extremity assist Stand to Sit: 6: Modified independent (Device/Increase time);With upper extremity assist  Trunk/Postural Assessment  Cervical Assessment Cervical Assessment: Exceptions to [facility] Cervical Strength Overall Cervical Strength Comments: Pt with forward cervical protraction and slight flexion Lumbar Assessment Lumbar Assessment: Exceptions to [facility] Lumbar Strength Overall Lumbar Strength Comments: Pt demonstrates slight kyphosis and has history of lumbar surgery. Postural Control Postural Limitations: Sits with L trunk shortened    Balance Balance Balance Assessed: Yes Dynamic Standing Balance Dynamic Standing - Balance Support: No upper extremity supported Dynamic Standing - Level of Assistance: 6: Modified independent (Device/Increase time) Extremity/Trunk Assessment RUE Assessment RUE Assessment: Within Functional Limits LUE Assessment LUE Assessment: Within Functional Limits  See FIM for current functional status  Maricia Scotti OTR/L  03/08/2012, 12:07 PM

## 2012-03-08 NOTE — Progress Notes (Signed)
Physical Therapy Discharge Summary  Patient Details  Name: [name] [name] MRN: [id number] Date of Birth: 1955-12-12  Today's Date: 03/08/2012 Time: 0932-1030 Time Calculation (min): 58 min  Patient has met 11 of 11 long term goals due to improved activity tolerance, improved balance, improved postural control, ability to compensate for deficits, functional use of  left upper extremity and left lower extremity, improved attention, improved awareness and improved coordination.  Patient to discharge at an ambulatory level Modified Independent.   Patient's care partner unavailable to provide assistance at discharge, however care partner not necessary for 24/7 care secondary to patient discharging at independent/mod independent level with all functional mobility. Patient will continue to have assistance from CNA 4 hours/day and his sister, who comes to his house once per day, for household activities, such as cooking and cleaning.  Reasons goals not met: N/A, patient met/exceeded all LTGs. Patient will be discharging at an independent level for bed mobility, transfers (including car transfers), sitting and standing dynamic balance, and ambulation within a controlled environment, home environment, and community environment and a modified independent level for stair negotiation and floor transfers.  Recommendation:  Patient will benefit from ongoing skilled PT services in outpatient setting to continue to advance safe functional mobility, address ongoing impairments in balance, coordination, activity tolerance, vision, and vestibular system, and minimize fall risk.  Equipment: No equipment provided  Reasons for discharge: treatment goals met and discharge from hospital  Patient/family agrees with progress made and goals achieved: Yes  Skilled Interventions: Patient ambulated in community setting (hospital lobby and gift shop) x350' and modified independence (secondary to requiring increased time).  Patient able to negotiate various chairs and tables, rugs, people, and close environment of gift shop with narrow aisles and various objects/shelves. Patient did not require any cues for proper or safe negotiation of obstacles. Patient performed floor transfer and car transfer with modified independence (secondary to requiring increased time).  PT Discharge Precautions/Restrictions Precautions Precautions: None Precaution Comments: diplopia, visual deficits Restrictions Weight Bearing Restrictions: No Pain Pain Assessment Pain Assessment: No/denies pain Pain Score: 0-No pain Vision/Perception  Vision - History Baseline Vision: Other (comment) (Pt reports catarct rmoved on his right eye and lazy eye.) Visual History: Cataracts Patient Visual Report: Blurring of vision Perception Perception: Within Functional Limits Praxis Praxis: Intact  Cognition Overall Cognitive Status: Appears within functional limits for tasks assessed Arousal/Alertness: Awake/alert Orientation Level: Oriented X4 Attention: Selective Sustained Attention: Appears intact Selective Attention: Appears intact Memory: Appears intact Awareness: Appears intact Problem Solving: Appears intact Safety/Judgment: Appears intact Sensation Sensation Light Touch: Appears Intact Stereognosis: Appears Intact Proprioception: Appears Intact Additional Comments: Intact light touch and proprioception B LE Coordination Gross Motor Movements are Fluid and Coordinated: No Fine Motor Movements are Fluid and Coordinated: No Coordination and Movement Description: Decreased speed and accuracy with rapid alternating movements in B LE. Heel Shin Test: Slight dysmetria/undershooting noted L LE Motor  Motor Motor: Within Functional Limits Motor - Discharge Observations: Patient sits and stands with lumbar flexion secondary to hx of back surgeries.  Mobility Bed Mobility Bed Mobility: Supine to Sit;Sit to Supine Supine to Sit:  7: Independent Sit to Supine: 7: Independent Transfers Sit to Stand: 6: Modified independent (Device/Increase time);With armrests;From bed;With upper extremity assist Stand to Sit: 6: Modified independent (Device/Increase time);With armrests;With upper extremity assist Locomotion  Ambulation Ambulation: Yes Ambulation/Gait Assistance: 6: Modified independent (Device/Increase time) Ambulation Distance (Feet): 350 Feet Assistive device: None Gait Gait: Yes Gait Pattern: Step-through pattern;Wide base of support;Decreased weight shift  to left Stairs / Additional Locomotion Stairs: Yes Stairs Assistance: 6: Modified independent (Device/Increase time) Stair Management Technique: One rail Right;One rail Left;Other (comment);Forwards;Step to pattern;Alternating pattern (R rail ascending, L rail descending; alternating ascending, intermittent step-to and alternating pattern descending ) Number of Stairs: 12 Curb: 6: Modified independent (Device/increase time) Wheelchair Mobility Wheelchair Mobility: No Distance: 0  Trunk/Postural Assessment  Cervical Assessment Cervical Assessment: Exceptions to [facility] Cervical Strength Overall Cervical Strength Comments: Pt with forward head posture Lumbar Assessment Lumbar Assessment: Exceptions to [facility] Lumbar Strength Overall Lumbar Strength Comments: Pt demonstrates slight kyphosis and has history of lumbar surgery. Postural Control Postural Limitations: Sits with L trunk shortened   Balance Balance Balance Assessed: Yes Standardized Balance Assessment Standardized Balance Assessment: Berg Balance Test Berg Balance Test Sit to Stand: Able to stand without using hands and stabilize independently Standing Unsupported: Able to stand safely 2 minutes Sitting with Back Unsupported but Feet Supported on Floor or Stool: Able to sit safely and securely 2 minutes Stand to Sit: Sits safely with minimal use of hands Transfers: Able to transfer safely, minor  use of hands Standing Unsupported with Eyes Closed: Able to stand 10 seconds safely Standing Ubsupported with Feet Together: Able to place feet together independently and stand 1 minute safely From Standing, Reach Forward with Outstretched Arm: Can reach forward >12 cm safely (5") From Standing Position, Pick up Object from Floor: Able to pick up shoe safely and easily From Standing Position, Turn to Look Behind Over each Shoulder: Turn sideways only but maintains balance Turn 360 Degrees: Able to turn 360 degrees safely in 4 seconds or less Standing Unsupported, Alternately Place Feet on Step/Stool: Able to stand independently and safely and complete 8 steps in 20 seconds Standing Unsupported, One Foot in Front: Able to plae foot ahead of the other independently and hold 30 seconds Standing on One Leg: Tries to lift leg/unable to hold 3 seconds but remains standing independently Total Score: 49/56, indicating patient is at moderate risk for falls (>50%). Initial BERG Balance score upon evaluation was 41/56. Dynamic Standing Balance Dynamic Standing - Balance Support: No upper extremity supported Dynamic Standing - Level of Assistance: 6: Modified independent (Device/Increase time) Extremity Assessment  RLE Assessment RLE Assessment: Within Functional Limits LLE Strength LLE Overall Strength: Within Functional Limits for tasks assessed LLE Overall Strength Comments: Grossly WFL with 5/5 strenth, 4/5 hip flexion, ankle DF/PF  See FIM for current functional status  Rhodes. Shahzaib Azevedo, PT, DPT  03/08/2012, 3:13 PM

## 2012-03-08 NOTE — Progress Notes (Signed)
Patient and sister received written and verbal discharge instructions from [employer], [place]. Patient and sister deny any questions or concerns. Patient and sister aware  Medications and of follow up appointments. Personal belongings packed by family and staff. Patient will be taken to private vehicle by NT via wheelchair. [place]on[place], [place] Uher Selinda Eon

## 2012-03-09 ENCOUNTER — Encounter (HOSPITAL_COMMUNITY): Payer: Self-pay

## 2012-03-10 ENCOUNTER — Ambulatory Visit: Payer: PRIVATE HEALTH INSURANCE | Admitting: Internal Medicine

## 2012-03-11 ENCOUNTER — Ambulatory Visit: Payer: PRIVATE HEALTH INSURANCE | Admitting: Physical Therapy

## 2012-03-12 ENCOUNTER — Emergency Department (HOSPITAL_COMMUNITY): Payer: PRIVATE HEALTH INSURANCE

## 2012-03-12 ENCOUNTER — Encounter (HOSPITAL_COMMUNITY): Payer: Self-pay | Admitting: *Deleted

## 2012-03-12 ENCOUNTER — Inpatient Hospital Stay (HOSPITAL_COMMUNITY)
Admission: EM | Admit: 2012-03-12 | Discharge: 2012-03-14 | DRG: 292 | Disposition: A | Discharge status: Home Health | Payer: PRIVATE HEALTH INSURANCE | Attending: Cardiovascular Disease | Admitting: Cardiovascular Disease

## 2012-03-12 DIAGNOSIS — F1410 Cocaine abuse, uncomplicated: Secondary | ICD-10-CM | POA: Diagnosis present

## 2012-03-12 DIAGNOSIS — E785 Hyperlipidemia, unspecified: Secondary | ICD-10-CM | POA: Diagnosis present

## 2012-03-12 DIAGNOSIS — M129 Arthropathy, unspecified: Secondary | ICD-10-CM | POA: Diagnosis present

## 2012-03-12 DIAGNOSIS — D75829 Heparin-induced thrombocytopenia, unspecified: Secondary | ICD-10-CM | POA: Diagnosis present

## 2012-03-12 DIAGNOSIS — D7582 Heparin induced thrombocytopenia (HIT): Secondary | ICD-10-CM | POA: Diagnosis present

## 2012-03-12 DIAGNOSIS — B1920 Unspecified viral hepatitis C without hepatic coma: Secondary | ICD-10-CM | POA: Diagnosis present

## 2012-03-12 DIAGNOSIS — I214 Non-ST elevation (NSTEMI) myocardial infarction: Secondary | ICD-10-CM

## 2012-03-12 DIAGNOSIS — E876 Hypokalemia: Secondary | ICD-10-CM | POA: Diagnosis present

## 2012-03-12 DIAGNOSIS — I472 Ventricular tachycardia, unspecified: Secondary | ICD-10-CM | POA: Diagnosis present

## 2012-03-12 DIAGNOSIS — I5021 Acute systolic (congestive) heart failure: Principal | ICD-10-CM | POA: Diagnosis present

## 2012-03-12 DIAGNOSIS — F1010 Alcohol abuse, uncomplicated: Secondary | ICD-10-CM | POA: Diagnosis present

## 2012-03-12 DIAGNOSIS — R51 Headache: Secondary | ICD-10-CM | POA: Diagnosis present

## 2012-03-12 DIAGNOSIS — D649 Anemia, unspecified: Secondary | ICD-10-CM | POA: Diagnosis present

## 2012-03-12 DIAGNOSIS — Z8673 Personal history of transient ischemic attack (TIA), and cerebral infarction without residual deficits: Secondary | ICD-10-CM

## 2012-03-12 DIAGNOSIS — Z87891 Personal history of nicotine dependence: Secondary | ICD-10-CM

## 2012-03-12 DIAGNOSIS — I509 Heart failure, unspecified: Secondary | ICD-10-CM | POA: Diagnosis present

## 2012-03-12 DIAGNOSIS — I4729 Other ventricular tachycardia: Secondary | ICD-10-CM | POA: Diagnosis present

## 2012-03-12 DIAGNOSIS — I10 Essential (primary) hypertension: Secondary | ICD-10-CM | POA: Diagnosis present

## 2012-03-12 DIAGNOSIS — K219 Gastro-esophageal reflux disease without esophagitis: Secondary | ICD-10-CM | POA: Diagnosis present

## 2012-03-12 DIAGNOSIS — F209 Schizophrenia, unspecified: Secondary | ICD-10-CM | POA: Diagnosis present

## 2012-03-12 LAB — COMPREHENSIVE METABOLIC PANEL
CO2: 23 mEq/L (ref 19–32)
Calcium: 9.2 mg/dL (ref 8.4–10.5)
Chloride: 105 mEq/L (ref 96–112)
Creatinine, Ser: 1.23 mg/dL (ref 0.50–1.35)
GFR calc Af Amer: 73 mL/min — ABNORMAL LOW (ref >90)
GFR calc non Af Amer: 63 mL/min — ABNORMAL LOW (ref >90)
Glucose, Bld: 98 mg/dL (ref 70–99)
Total Bilirubin: 0.6 mg/dL (ref 0.3–1.2)

## 2012-03-12 LAB — CBC
MCH: 29.8 pg (ref 26.0–34.0)
MCHC: 32.1 g/dL (ref 30.0–36.0)
MCV: 93 fL (ref 78.0–100.0)
Platelets: 281 10*3/uL (ref 150–400)
RDW: 14.7 % (ref 11.5–15.5)
WBC: 7.4 10*3/uL (ref 4.0–10.5)

## 2012-03-12 LAB — CBC WITH DIFFERENTIAL/PLATELET
Eosinophils Relative: 4 % (ref 0–5)
HCT: 30.3 % — ABNORMAL LOW (ref 39.0–52.0)
Hemoglobin: 9.8 g/dL — ABNORMAL LOW (ref 13.0–17.0)
Lymphocytes Relative: 15 % (ref 12–46)
Lymphs Abs: 1.2 10*3/uL (ref 0.7–4.0)
MCV: 92.9 fL (ref 78.0–100.0)
Monocytes Absolute: 0.8 10*3/uL (ref 0.1–1.0)
Monocytes Relative: 10 % (ref 3–12)
RBC: 3.26 MIL/uL — ABNORMAL LOW (ref 4.22–5.81)
WBC: 8.1 10*3/uL (ref 4.0–10.5)

## 2012-03-12 LAB — POCT I-STAT TROPONIN I

## 2012-03-12 LAB — CREATININE, SERUM: GFR calc Af Amer: 76 mL/min — ABNORMAL LOW (ref >90)

## 2012-03-12 MED ORDER — MAGNESIUM OXIDE 400 (241.3 MG) MG PO TABS
200.0000 mg | ORAL_TABLET | Freq: Two times a day (BID) | ORAL | Status: DC
  Administered 2012-03-12 – 2012-03-14 (×4): 200 mg via ORAL
  Filled 2012-03-12 (×5): qty 0.5

## 2012-03-12 MED ORDER — FUROSEMIDE 10 MG/ML IJ SOLN
40.0000 mg | Freq: Two times a day (BID) | INTRAMUSCULAR | Status: DC
  Administered 2012-03-12 – 2012-03-14 (×4): 40 mg via INTRAVENOUS
  Filled 2012-03-12 (×7): qty 4

## 2012-03-12 MED ORDER — TRAMADOL HCL 50 MG PO TABS
50.0000 mg | ORAL_TABLET | Freq: Four times a day (QID) | ORAL | Status: DC | PRN
  Administered 2012-03-12 – 2012-03-13 (×3): 50 mg via ORAL
  Filled 2012-03-12 (×3): qty 1

## 2012-03-12 MED ORDER — HEPARIN (PORCINE) IN NACL 100-0.45 UNIT/ML-% IJ SOLN
1450.0000 [IU]/h | INTRAMUSCULAR | Status: DC
  Filled 2012-03-12: qty 250

## 2012-03-12 MED ORDER — FOLIC ACID 1 MG PO TABS
1.0000 mg | ORAL_TABLET | Freq: Every day | ORAL | Status: DC
  Administered 2012-03-13 – 2012-03-14 (×2): 1 mg via ORAL
  Filled 2012-03-12 (×2): qty 1

## 2012-03-12 MED ORDER — ZOLPIDEM TARTRATE 5 MG PO TABS
5.0000 mg | ORAL_TABLET | Freq: Every evening | ORAL | Status: DC | PRN
  Administered 2012-03-12 – 2012-03-13 (×2): 5 mg via ORAL
  Filled 2012-03-12 (×2): qty 1

## 2012-03-12 MED ORDER — CLONIDINE HCL 0.3 MG PO TABS
0.3000 mg | ORAL_TABLET | Freq: Two times a day (BID) | ORAL | Status: DC
  Administered 2012-03-12 – 2012-03-14 (×4): 0.3 mg via ORAL
  Filled 2012-03-12 (×6): qty 1

## 2012-03-12 MED ORDER — ACETAMINOPHEN 325 MG PO TABS
650.0000 mg | ORAL_TABLET | ORAL | Status: DC | PRN
  Filled 2012-03-12: qty 2

## 2012-03-12 MED ORDER — ARIPIPRAZOLE 10 MG PO TABS
10.0000 mg | ORAL_TABLET | Freq: Every day | ORAL | Status: DC
  Administered 2012-03-13 – 2012-03-14 (×2): 10 mg via ORAL
  Filled 2012-03-12 (×2): qty 1

## 2012-03-12 MED ORDER — HEPARIN (PORCINE) IN NACL 100-0.45 UNIT/ML-% IJ SOLN
1700.0000 [IU]/h | INTRAMUSCULAR | Status: DC
  Administered 2012-03-12: 1450 [IU]/h via INTRAVENOUS
  Administered 2012-03-13: 1700 [IU]/h via INTRAVENOUS
  Filled 2012-03-12 (×3): qty 250

## 2012-03-12 MED ORDER — SODIUM CHLORIDE 0.9 % IV SOLN: 250.0000 mL | INTRAVENOUS | Status: DC | PRN

## 2012-03-12 MED ORDER — ALPRAZOLAM 0.25 MG PO TABS
0.2500 mg | ORAL_TABLET | Freq: Two times a day (BID) | ORAL | Status: DC | PRN
  Administered 2012-03-12 – 2012-03-14 (×3): 0.25 mg via ORAL
  Filled 2012-03-12 (×3): qty 1

## 2012-03-12 MED ORDER — FENOFIBRATE 54 MG PO TABS
54.0000 mg | ORAL_TABLET | Freq: Every day | ORAL | Status: DC
  Administered 2012-03-13 – 2012-03-14 (×2): 54 mg via ORAL
  Filled 2012-03-12 (×2): qty 1

## 2012-03-12 MED ORDER — SODIUM CHLORIDE 0.9 % IJ SOLN
3.0000 mL | INTRAMUSCULAR | Status: DC | PRN
  Administered 2012-03-13: 3 mL via INTRAVENOUS

## 2012-03-12 MED ORDER — HEPARIN SODIUM (PORCINE) 5000 UNIT/ML IJ SOLN: 5000.0000 [IU] | Freq: Three times a day (TID) | INTRAMUSCULAR | Status: DC

## 2012-03-12 MED ORDER — SODIUM CHLORIDE 0.9 % IJ SOLN
3.0000 mL | Freq: Two times a day (BID) | INTRAMUSCULAR | Status: DC
  Administered 2012-03-12 – 2012-03-13 (×2): 3 mL via INTRAVENOUS

## 2012-03-12 MED ORDER — ASPIRIN EC 81 MG PO TBEC
81.0000 mg | DELAYED_RELEASE_TABLET | Freq: Every day | ORAL | Status: DC
  Administered 2012-03-12 – 2012-03-14 (×3): 81 mg via ORAL
  Filled 2012-03-12 (×3): qty 1

## 2012-03-12 MED ORDER — ISOSORB DINITRATE-HYDRALAZINE 20-37.5 MG PO TABS
1.0000 | ORAL_TABLET | Freq: Two times a day (BID) | ORAL | Status: DC
  Administered 2012-03-12 – 2012-03-14 (×4): 1 via ORAL
  Filled 2012-03-12 (×5): qty 1

## 2012-03-12 MED ORDER — ONDANSETRON HCL 4 MG/2ML IJ SOLN: 4.0000 mg | Freq: Four times a day (QID) | INTRAMUSCULAR | Status: DC | PRN

## 2012-03-12 MED ORDER — LISINOPRIL 20 MG PO TABS
20.0000 mg | ORAL_TABLET | Freq: Every day | ORAL | Status: DC
  Administered 2012-03-13 – 2012-03-14 (×2): 20 mg via ORAL
  Filled 2012-03-12 (×2): qty 1

## 2012-03-12 MED ORDER — ADULT MULTIVITAMIN W/MINERALS CH
1.0000 | ORAL_TABLET | Freq: Every day | ORAL | Status: DC
  Administered 2012-03-13 – 2012-03-14 (×2): 1 via ORAL
  Filled 2012-03-12 (×2): qty 1

## 2012-03-12 MED ORDER — CARVEDILOL 6.25 MG PO TABS
6.2500 mg | ORAL_TABLET | Freq: Two times a day (BID) | ORAL | Status: DC
  Administered 2012-03-12 – 2012-03-14 (×4): 6.25 mg via ORAL
  Filled 2012-03-12 (×7): qty 1

## 2012-03-12 NOTE — ED Notes (Signed)
PT discharge on Tuesday after being here for 3 weeks from a stroke.  Thursday nite started having shortness of breath and developed dizziness.  Pt reports some chest pain and hard to sleep./  Pt reports sob with exertion.   Pt reports swelling in left ankle

## 2012-03-12 NOTE — ED Notes (Signed)
Nurse unable to take report x2. To call back.

## 2012-03-12 NOTE — Progress Notes (Signed)
ANTICOAGULATION CONSULT NOTE - Follow Up Consult  Pharmacy Consult for heparin Indication: chest pain/ACS  Labs:  Recent Labs  03/12/12 1429 03/12/12 1818 03/12/12 2240  HGB 9.8* 9.4*  --   HCT 30.3* 29.3*  --   PLT 287 281  --   HEPARINUNFRC  --   --  0.11*  CREATININE 1.23 1.19  --   TROPONINI  --   --  <0.30    Assessment: 57yo male subtherapeutic on heparin with initial dosing for CP in setting of recent CVA.  Goal of Therapy:  Heparin level 0.3-0.5 units/ml   Plan:  Will increase heparin gtt by ~3 units/kg/hr to 1700 units/hr and check level in 6hr.  Rogue Bussing PharmD BCPS 03/12/2012,11:42 PM

## 2012-03-12 NOTE — Progress Notes (Signed)
ANTICOAGULATION CONSULT NOTE - Initial Consult  Pharmacy Consult for heparin Indication: chest pain/ACS  Allergies  Allergen Reactions  . Thorazine (Chlorpromazine Hcl) Other (See Comments)    Body freezes up    Patient Measurements:   Heparin Dosing Weight: 101.7 kg  Vital Signs: Temp: 98 F (36.7 C) (02/15 1459) Temp src: Oral (02/15 1459) BP: 164/93 mmHg (02/15 1459) Pulse Rate: 61 (02/15 1459)  Labs:  Recent Labs  03/12/12 1429  HGB 9.8*  HCT 30.3*  PLT 287  CREATININE 1.23    The CrCl is unknown because both a height and weight (above a minimum accepted value) are required for this calculation.   Medical History: Past Medical History  Diagnosis Date  . H/O ETOH abuse   . Colitis   . GERD (gastroesophageal reflux disease)   . Hepatitis     HEP  C  . Cocaine abuse     HISTORY OF  . 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 Diverticulitis     H/O  . Headache   . Arthritis   . Schizophrenia, schizo-affective   . Schizo affective schizophrenia     TAKES ABILIFY  IF  NEEDED  . Cataract   . Hyperlipidemia   . Wears glasses   . Wears dentures     upper  . Hypertension   . Schizophrenia   . Hepatitis C   . HIT (heparin-induced thrombocytopenia)   . H/O: alcohol abuse   . H/O cocaine abuse   . Stroke   . CHF (congestive heart failure) EF 30-35% 02/29/2012    Medications:  Scheduled:  Infusions:   Assessment: 57 yo male with ACS will be started on heparin therapy.  Baseline H/H 9.8/30.3 and Plt 287 K.  Not on any anticoagulant prior to admission.  Patient had a recent stroke and was here for about 3 weeks (no alteplase was used) Goal of Therapy:  6hr heparin level 0.3-0.5 Monitor platelets by anticoagulation protocol: Yes   Plan:  1) Start heparin drip at 1450 units/hr. No bolus. 2) 6hr heparin level after drip is started. 3) Daily heparin level and CBC  Keilen Kahl, Tsz-Yin 03/12/2012,4:13 PM

## 2012-03-12 NOTE — ED Notes (Signed)
2 attempts for blood draw both unsuccessful.

## 2012-03-12 NOTE — H&P (Signed)
[name] [name] is an 57 y.o. male.   Chief Complaint: Shortness of breath. HPI: 57 years old male with 2 day history of shortness of breath. Admits to excess fluid intake. Also has some chest pain and orthopnea and exertional dyspnea. + bilateral leg edema. Chest x-ray positive for cardiomegaly and vascular congestion. Recently discharged from hospital post 2 weeks of rehab for stroke.  Past Medical History  Diagnosis Date  . H/O ETOH abuse   . Colitis   . GERD (gastroesophageal reflux disease)   . Hepatitis     HEP  C  . Cocaine abuse     HISTORY OF  . 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 Diverticulitis     H/O  . Headache   . Arthritis   . Schizophrenia, schizo-affective   . Schizo affective schizophrenia     TAKES ABILIFY  IF  NEEDED  . Cataract   . Hyperlipidemia   . Wears glasses   . Wears dentures     upper  . Hypertension   . Schizophrenia   . Hepatitis C   . HIT (heparin-induced thrombocytopenia)   . H/O: alcohol abuse   . H/O cocaine abuse   . Stroke   . CHF (congestive heart failure) EF 30-35% 02/29/2012      Past Surgical History  Procedure Laterality Date  . Back surgery      8 MTHS AGO  . Left achilles    . Tonsillectomy    . Knee arthroscopy    . I&d extremity  03/20/2011    Procedure: IRRIGATION AND DEBRIDEMENT EXTREMITY;  Surgeon: Kerin Salen, MD;  Location: [place];  Service: Orthopedics;  Laterality: Left;  I&D LEFT ACHILLIES TENDON  . Eye surgery      right eye  . Resection distal clavical  09/17/2011    Procedure: RESECTION DISTAL CLAVICAL;  Surgeon: Nita Sells, MD;  Location: [place];  Service: Orthopedics;  Laterality: Right;  right shoulder arthroscopy with sad and open distal clavicle excision   . Back surgery      Family History  Problem Relation Age of Onset  . Heart disease Mother   . Heart disease Father   . Heart disease Sister   . CAD Mother   . CAD Father   . CAD Sister     Social History:  reports that he has quit smoking. He does not have any smokeless tobacco history on file. He reports that  drinks alcohol. He reports that he does not use illicit drugs.  Allergies:  Allergies  Allergen Reactions  . Thorazine (Chlorpromazine Hcl) Other (See Comments)    Body freezes up     (Not in a hospital admission)  Results for orders placed during the hospital encounter of 03/12/12 (from the past 48 hour(s))  PRO B NATRIURETIC PEPTIDE     Status: Abnormal   Collection Time    03/12/12  2:29 PM      Result Value Range   Pro B Natriuretic peptide (BNP) 2988.0 (*) 0 - 125 pg/mL  CBC WITH DIFFERENTIAL     Status: Abnormal   Collection Time    03/12/12  2:29 PM      Result Value Range   WBC 8.1  4.0 - 10.5 K/uL   RBC 3.26 (*) 4.22 - 5.81 MIL/uL   Hemoglobin 9.8 (*) 13.0 - 17.0 g/dL   HCT 30.3 (*) 39.0 - 52.0 %   MCV 92.9  78.0 - 100.0 fL   MCH 30.1  26.0 - 34.0 pg   MCHC 32.3  30.0 - 36.0 g/dL   RDW 14.4  11.5 - 15.5 %   Platelets 287  150 - 400 K/uL   Neutrophils Relative 70  43 - 77 %   Neutro Abs 5.7  1.7 - 7.7 K/uL   Lymphocytes Relative 15  12 - 46 %   Lymphs Abs 1.2  0.7 - 4.0 K/uL   Monocytes Relative 10  3 - 12 %   Monocytes Absolute 0.8  0.1 - 1.0 K/uL   Eosinophils Relative 4  0 - 5 %   Eosinophils Absolute 0.3  0.0 - 0.7 K/uL   Basophils Relative 1  0 - 1 %   Basophils Absolute 0.0  0.0 - 0.1 K/uL  COMPREHENSIVE METABOLIC PANEL     Status: Abnormal   Collection Time    03/12/12  2:29 PM      Result Value Range   Sodium 137  135 - 145 mEq/L   Potassium 3.9  3.5 - 5.1 mEq/L   Chloride 105  96 - 112 mEq/L   CO2 23  19 - 32 mEq/L   Glucose, Bld 98  70 - 99 mg/dL   BUN 16  6 - 23 mg/dL   Creatinine, Ser 1.23  0.50 - 1.35 mg/dL   Calcium 9.2  8.4 - 10.5 mg/dL   Total Protein 6.9  6.0 - 8.3 g/dL   Albumin 3.3 (*) 3.5 - 5.2 g/dL   AST 37  0 - 37 U/L   ALT 58 (*) 0 - 53 U/L   Alkaline Phosphatase 113  39 - 117 U/L   Total Bilirubin 0.6   0.3 - 1.2 mg/dL   GFR calc non Af Amer 63 (*) >90 mL/min   GFR calc Af Amer 73 (*) >90 mL/min   Comment:            The eGFR has been calculated     using the CKD EPI equation.     This calculation has not been     validated in all clinical     situations.     eGFR's persistently     <90 mL/min signify     possible Chronic Kidney Disease.  POCT I-STAT TROPONIN I     Status: Abnormal   Collection Time    03/12/12  2:44 PM      Result Value Range   Troponin i, poc 0.10 (*) 0.00 - 0.08 ng/mL   Comment NOTIFIED PHYSICIAN     Comment 3            Comment: Due to the release kinetics of cTnI,     a negative result within the first hours     of the onset of symptoms does not rule out     myocardial infarction with certainty.     If myocardial infarction is still suspected,     repeat the test at appropriate intervals.   Dg Chest 2 View  03/12/2012  *RADIOLOGY REPORT*  Clinical Data: Short of breath  CHEST - 2 VIEW  Comparison: 03/03/2011  Findings: Lungs are under aerated with mild basilar atelectasis. Tiny pleural effusions.  Mild cardiomegaly.  Tortuous aorta. Vascular congestion.  IMPRESSION: Cardiomegaly with vascular congestion and tiny pleural effusions.   Original Report Authenticated By: Marybelle Killings, M.D.     @ROS @ + weight gain, + shortness of breath, + Chest pain, + lef edema, +Hypertension, +  schizophrenia, + hepatitis C, + stroke, + alcohol abuse, + cocaine abuse.  Blood pressure 147/78, pulse 62, temperature 98 F (36.7 C), temperature source Oral, resp. rate 18, SpO2 95.00%.  GEN: patient sitting up  in the stretcher in moderate respiratory distress; cooperative with exam.  PSYCH: alert and oriented x 3; does appear anxious.  HEENT: [place]/AT, Brown eyes, Mucous membranes pink and anicteric; PERRLA; EOM intact;  NECK: No cervical lymphadenopathy nor thyromegaly or carotid bruit; + JVD; There were no stridor. Neck is very supple.  CHEST WALL: No tenderness  CHEST: Bibasilar  crackles.  HEART: Regular rate and rhythm. II/VI systolic murmur. No rub, or gallops.  BACK: No kyphosis or scoliosis; no CVA tenderness  ABDOMEN: soft and non-tender; no masses, no organomegaly, normal abdominal bowel sounds;  EXTREMITIES: No bone or joint deformity; age-appropriate arthropathy of the hands and knees; 2 + edema; no ulcerations. There is no calf tenderness.  PULSES: 2+ and symmetric  SKIN: Normal hydration no rash or ulceration  CNS: Cranial nerves 2-12 grossly intact no focal lateralizing neurologic deficit. Speech is slighly slurred uvula elevated with phonation, facial symmetry and tongue midline.   Assessment/Plan Acute left heart systolic failure. Recent stroke-Cerebellar infarcts and possible left brain stem infarct. Obesity Hypertension Schizophrenia H/O alcohol and drug abuse  Admit/IV lasix/Oxygen/Home medications. Discussed decreased salt and fluid intake.  Tavian Callander S 03/12/2012, 5:18 PM

## 2012-03-12 NOTE — ED Notes (Signed)
Floor nurse unable to take report at the time. Nurse to call back.

## 2012-03-13 LAB — IRON AND TIBC
Saturation Ratios: 26 % (ref 20–55)
TIBC: 230 ug/dL (ref 215–435)
UIBC: 170 ug/dL (ref 125–400)

## 2012-03-13 LAB — HEPARIN LEVEL (UNFRACTIONATED)
Heparin Unfractionated: 0.18 IU/mL — ABNORMAL LOW (ref 0.30–0.70)
Heparin Unfractionated: 0.44 IU/mL (ref 0.30–0.70)

## 2012-03-13 LAB — CBC
MCH: 30 pg (ref 26.0–34.0)
MCV: 92.6 fL (ref 78.0–100.0)
Platelets: 274 10*3/uL (ref 150–400)
RBC: 3.23 MIL/uL — ABNORMAL LOW (ref 4.22–5.81)

## 2012-03-13 LAB — BASIC METABOLIC PANEL
CO2: 21 mEq/L (ref 19–32)
Calcium: 9.2 mg/dL (ref 8.4–10.5)
Creatinine, Ser: 1.26 mg/dL (ref 0.50–1.35)

## 2012-03-13 LAB — MRSA PCR SCREENING: MRSA by PCR: NEGATIVE

## 2012-03-13 MED ORDER — HEPARIN (PORCINE) IN NACL 100-0.45 UNIT/ML-% IJ SOLN
2200.0000 [IU]/h | INTRAMUSCULAR | Status: DC
  Administered 2012-03-13: 2200 [IU]/h via INTRAVENOUS
  Filled 2012-03-13 (×3): qty 250

## 2012-03-13 MED ORDER — MAGNESIUM SULFATE IN D5W 10-5 MG/ML-% IV SOLN
1.0000 g | Freq: Once | INTRAVENOUS | Status: AC
  Administered 2012-03-13: 1 g via INTRAVENOUS
  Filled 2012-03-13: qty 100

## 2012-03-13 MED ORDER — POTASSIUM CHLORIDE CRYS ER 20 MEQ PO TBCR
30.0000 meq | EXTENDED_RELEASE_TABLET | Freq: Three times a day (TID) | ORAL | Status: DC
  Administered 2012-03-13 – 2012-03-14 (×4): 30 meq via ORAL
  Filled 2012-03-13 (×7): qty 1

## 2012-03-13 MED ORDER — HEPARIN (PORCINE) IN NACL 100-0.45 UNIT/ML-% IJ SOLN
2000.0000 [IU]/h | INTRAMUSCULAR | Status: DC
  Filled 2012-03-13 (×2): qty 250

## 2012-03-13 NOTE — Progress Notes (Signed)
Subjective:  Feeling better. Less short of breath.  Objective:  Vital Signs in the last 24 hours: Temp:  [98 F (36.7 C)-98.4 F (36.9 C)] 98.4 F (36.9 C) (02/16 1409) Pulse Rate:  [55-68] 61 (02/16 1409) Cardiac Rhythm:  [-] Normal sinus rhythm (02/16 0900) Resp:  [18-24] 19 (02/16 1409) BP: (109-185)/(67-123) 122/80 mmHg (02/16 1409) SpO2:  [95 %-100 %] 97 % (02/16 1409) Weight:  [117.663 kg (259 lb 6.4 oz)-117.7 kg (259 lb 7.7 oz)] 117.663 kg (259 lb 6.4 oz) (02/16 0446)  Physical Exam: BP Readings from Last 1 Encounters:  03/13/12 122/80     Wt Readings from Last 1 Encounters:  03/13/12 117.663 kg (259 lb 6.4 oz)    Weight change:   HEENT: [place]/AT, Eyes-Brown, PERL, EOMI, Conjunctiva-Pale pink, Sclera-Non-icteric Neck: No JVD, No bruit, Trachea midline. Lungs:  Clear, Bilateral. Cardiac:  Regular rhythm, normal S1 and S2, no S3.  Abdomen:  Soft, non-tender. Extremities:  1 + edema present. No cyanosis. No clubbing. CNS: AxOx3, Cranial nerves grossly intact, moves all 4 extremities. Right handed. Skin: Warm and dry.   Intake/Output from previous day: 02/15 0701 - 02/16 0700 In: 1040 [P.O.:1040] Out: -     Lab Results: BMET    Component Value Date/Time   NA 137 03/13/2012 0614   K 3.3* 03/13/2012 0614   CL 102 03/13/2012 0614   CO2 21 03/13/2012 0614   GLUCOSE 104* 03/13/2012 0614   BUN 17 03/13/2012 0614   CREATININE 1.26 03/13/2012 0614   CALCIUM 9.2 03/13/2012 0614   GFRNONAA 61* 03/13/2012 0614   GFRAA 71* 03/13/2012 0614   CBC    Component Value Date/Time   WBC 6.4 03/13/2012 0614   RBC 3.23* 03/13/2012 0614   HGB 9.7* 03/13/2012 0614   HCT 29.9* 03/13/2012 0614   PLT 274 03/13/2012 0614   MCV 92.6 03/13/2012 0614   MCH 30.0 03/13/2012 0614   MCHC 32.4 03/13/2012 0614   RDW 14.6 03/13/2012 0614   LYMPHSABS 1.2 03/12/2012 1429   MONOABS 0.8 03/12/2012 1429   EOSABS 0.3 03/12/2012 1429   BASOSABS 0.0 03/12/2012 1429   CARDIAC ENZYMES Lab Results  Component Value  Date   CKTOTAL 3673* 02/29/2012   CKMB 11.2* 02/25/2012   TROPONINI <0.30 03/13/2012    Scheduled Meds: . ARIPiprazole  10 mg Oral Daily  . aspirin EC  81 mg Oral Daily  . carvedilol  6.25 mg Oral BID WC  . cloNIDine  0.3 mg Oral BID  . fenofibrate  54 mg Oral Daily  . folic acid  1 mg Oral Daily  . furosemide  40 mg Intravenous Q12H  . isosorbide-hydrALAZINE  1 tablet Oral BID  . lisinopril  20 mg Oral Daily  . magnesium oxide  200 mg Oral BID  . multivitamin with minerals  1 tablet Oral Daily  . potassium chloride  30 mEq Oral TID  . sodium chloride  3 mL Intravenous Q12H   Continuous Infusions: . heparin 2,000 Units/hr (03/13/12 0749)   PRN Meds:.sodium chloride, acetaminophen, ALPRAZolam, ondansetron (ZOFRAN) IV, sodium chloride, traMADol, zolpidem  Assessment/Plan: Acute left heart systolic failure.  Recent stroke-Cerebellar infarcts and possible left brain stem infarct.  Obesity  Hypertension  Schizophrenia  H/O alcohol and drug abuse Hypokalemia Nonsustained VT.  Offered right and left heart cath. Patient to let me know. Continue diuresis. K+ replacement   LOS: 1 day    Dixie Dials  MD  03/13/2012, 2:23 PM

## 2012-03-13 NOTE — Progress Notes (Signed)
  Echocardiogram 2D Echocardiogram has been performed.  ROCHELL, VAZZANA 03/13/2012, 11:48 AM

## 2012-03-13 NOTE — ED Provider Notes (Signed)
I saw and evaluated the patient, reviewed the resident's note and I agree with the findings and plan.  Threasa Beards, MD 03/13/12 [phone]

## 2012-03-13 NOTE — ED Provider Notes (Signed)
History     CSN: [id number]  Arrival date & time 03/12/12  1248   First MD Initiated Contact with Patient 03/12/12 1455      Chief Complaint  Patient presents with  . Shortness of Breath    (Consider location/radiation/quality/duration/timing/severity/associated sxs/prior treatment) Patient is a 57 y.o. male presenting with shortness of breath.  Shortness of Breath Severity:  Moderate Onset quality:  Gradual Duration:  5 days Timing:  Constant Progression:  Worsening Chronicity:  New Context: activity   Relieved by:  Nothing Worsened by:  Nothing tried Ineffective treatments:  None tried Associated symptoms: no abdominal pain, no chest pain, no cough, no fever, no headaches, no neck pain, no rash, no sore throat and no vomiting   Risk factors: prolonged immobilization   Risk factors: no family hx of DVT, no hx of PE/DVT, no recent surgery and no tobacco use     Past Medical History  Diagnosis Date  . H/O ETOH abuse   . Colitis   . GERD (gastroesophageal reflux disease)   . Hepatitis     HEP  C  . Cocaine abuse     HISTORY OF  . 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 Diverticulitis     H/O  . Headache   . Arthritis   . Schizophrenia, schizo-affective   . Schizo affective schizophrenia     TAKES ABILIFY  IF  NEEDED  . Cataract   . Hyperlipidemia   . Wears glasses   . Wears dentures     upper  . Hypertension   . Schizophrenia   . Hepatitis C   . HIT (heparin-induced thrombocytopenia)   . H/O: alcohol abuse   . H/O cocaine abuse   . Stroke   . CHF (congestive heart failure) EF 30-35% 02/29/2012    Past Surgical History  Procedure Laterality Date  . Back surgery      8 MTHS AGO  . Left achilles    . Tonsillectomy    . Knee arthroscopy    . I&d extremity  03/20/2011    Procedure: IRRIGATION AND DEBRIDEMENT EXTREMITY;  Surgeon: Kerin Salen, MD;  Location: [facility];  Service: Orthopedics;  Laterality: Left;  I&D LEFT ACHILLIES TENDON  .  Eye surgery      right eye  . Resection distal clavical  09/17/2011    Procedure: RESECTION DISTAL CLAVICAL;  Surgeon: Nita Sells, MD;  Location: [facility];  Service: Orthopedics;  Laterality: Right;  right shoulder arthroscopy with sad and open distal clavicle excision   . Back surgery      Family History  Problem Relation Age of Onset  . Heart disease Mother   . Heart disease Father   . Heart disease Sister   . CAD Mother   . CAD Father   . CAD Sister     History  Substance Use Topics  . Smoking status: Former Research scientist (life sciences)  . Smokeless tobacco: Not on file  . Alcohol Use: Yes     Comment: frequently-- 1/5th a day      Review of Systems  Constitutional: Negative for fever and chills.  HENT: Negative for congestion, sore throat and neck pain.   Respiratory: Positive for shortness of breath. Negative for cough.   Cardiovascular: Negative for chest pain.  Gastrointestinal: Negative for nausea, vomiting, abdominal pain, diarrhea and constipation.  Endocrine: Negative for polyuria.  Genitourinary: Negative for dysuria and hematuria.  Skin: Negative for rash.  Neurological:  Negative for headaches.  Psychiatric/Behavioral: Negative.     Allergies  Thorazine  Home Medications  No current outpatient prescriptions on file.  BP 109/67  Pulse 68  Temp(Src) 98.2 F (36.8 C) (Oral)  Resp 18  Ht 5\' 11"  (1.803 m)  Wt 246 lb 4.1 oz (111.7 kg)  BMI 34.36 kg/m2  SpO2 98%  Physical Exam  Nursing note and vitals reviewed. Constitutional: He is oriented to person, place, and time. He appears well-developed and well-nourished. No distress.  HENT:  Head: Normocephalic and atraumatic.  Right Ear: External ear normal.  Left Ear: External ear normal.  Mouth/Throat: Oropharynx is clear and moist. No oropharyngeal exudate.  Eyes: Conjunctivae are normal. Pupils are equal, round, and reactive to light. Right eye exhibits no discharge.  Neck: Normal range of  motion. Neck supple. No tracheal deviation present.  Cardiovascular: Normal rate, regular rhythm and intact distal pulses.   Pulmonary/Chest: Effort normal. No respiratory distress. He has no wheezes. He has rales (bilateral).  Abdominal: Soft. He exhibits no distension. There is no tenderness. There is no rebound and no guarding.  Musculoskeletal: Normal range of motion. He exhibits edema (2+ symmetric peripheral edema).  Neurological: He is alert and oriented to person, place, and time.  Skin: Skin is warm and dry. No rash noted. He is not diaphoretic.  Psychiatric: He has a normal mood and affect.    ED Course  Procedures (including critical care time)  Labs Reviewed  PRO B NATRIURETIC PEPTIDE - Abnormal; Notable for the following:    Pro B Natriuretic peptide (BNP) 2988.0 (*)    All other components within normal limits  CBC WITH DIFFERENTIAL - Abnormal; Notable for the following:    RBC 3.26 (*)    Hemoglobin 9.8 (*)    HCT 30.3 (*)    All other components within normal limits  COMPREHENSIVE METABOLIC PANEL - Abnormal; Notable for the following:    Albumin 3.3 (*)    ALT 58 (*)    GFR calc non Af Amer 63 (*)    GFR calc Af Amer 73 (*)    All other components within normal limits  HEPARIN LEVEL (UNFRACTIONATED) - Abnormal; Notable for the following:    Heparin Unfractionated 0.11 (*)    All other components within normal limits  CBC - Abnormal; Notable for the following:    RBC 3.15 (*)    Hemoglobin 9.4 (*)    HCT 29.3 (*)    All other components within normal limits  CREATININE, SERUM - Abnormal; Notable for the following:    GFR calc non Af Amer 66 (*)    GFR calc Af Amer 76 (*)    All other components within normal limits  POCT I-STAT TROPONIN I - Abnormal; Notable for the following:    Troponin i, poc 0.10 (*)    All other components within normal limits  MRSA PCR SCREENING  TROPONIN I  CBC  BASIC METABOLIC PANEL  TROPONIN I  TROPONIN I  HEPARIN LEVEL  (UNFRACTIONATED)   Dg Chest 2 View  03/12/2012  *RADIOLOGY REPORT*  Clinical Data: Short of breath  CHEST - 2 VIEW  Comparison: 03/03/2011  Findings: Lungs are under aerated with mild basilar atelectasis. Tiny pleural effusions.  Mild cardiomegaly.  Tortuous aorta. Vascular congestion.  IMPRESSION: Cardiomegaly with vascular congestion and tiny pleural effusions.   Original Report Authenticated By: Marybelle Killings, M.D.      1. NSTEMI (non-ST elevated myocardial infarction)   2. CHF exacerbation  MDM   [name] [name] is a 57 y.o. male who presents to the ED for concern of SOB and weakness over last 5 days.  Recent diagnosis of CHF but yet to see cardiologist.  Exam c/w CHF exacerbation.  CXR and BNP c/w exam.  Trope positive at 0.1.  Cardiology consulted for admission.  Nursing note mentioning L ankle swelling, but both ankles equally swollen.  No evidence of DVT.  Doubt PE in setting of no CP.  Patient still on RA with vital signs safe for floor.  Patient admitted.  No other acute concerns while in ED.        Rogelia Mire, MD 03/13/12 [phone]

## 2012-03-13 NOTE — Progress Notes (Addendum)
ANTICOAGULATION CONSULT NOTE - Follow Up Consult  Addendum: 6 hour heparin level sub therapeutic at  0.26 Plan 1. Increase heparin to 2200 units/hour 2. 6 hour heparin level 3. Monitor s/sx of bleeding    Pharmacy Consult for heparin Indication: chest pain/ACS  Allergies  Allergen Reactions  . Thorazine (Chlorpromazine Hcl) Other (See Comments)    Body freezes up    Patient Measurements: Height: 5\' 11"  (180.3 cm) Weight: 259 lb 6.4 oz (117.663 kg) (scale c) IBW/kg (Calculated) : 75.3 Heparin Dosing Weight: 97.05 kg  Vital Signs: Temp: 98.2 F (36.8 C) (02/16 0446) Temp src: Oral (02/16 0446) BP: 151/85 mmHg (02/16 0630) Pulse Rate: 60 (02/16 0446)  Labs:  Recent Labs  03/12/12 1429 03/12/12 1818 03/12/12 2240 03/13/12 0614  HGB 9.8* 9.4*  --  9.7*  HCT 30.3* 29.3*  --  29.9*  PLT 287 281  --  274  HEPARINUNFRC  --   --  0.11* 0.18*  CREATININE 1.23 1.19  --   --   TROPONINI  --   --  <0.30 <0.30    Estimated Creatinine Clearance: 88.3 ml/min (by C-G formula based on Cr of 1.19).   Medications:  Scheduled:  . ARIPiprazole  10 mg Oral Daily  . aspirin EC  81 mg Oral Daily  . carvedilol  6.25 mg Oral BID WC  . cloNIDine  0.3 mg Oral BID  . fenofibrate  54 mg Oral Daily  . folic acid  1 mg Oral Daily  . furosemide  40 mg Intravenous Q12H  . isosorbide-hydrALAZINE  1 tablet Oral BID  . lisinopril  20 mg Oral Daily  . magnesium oxide  200 mg Oral BID  . [COMPLETED] magnesium sulfate 1 - 4 g bolus IVPB  1 g Intravenous Once  . multivitamin with minerals  1 tablet Oral Daily  . sodium chloride  3 mL Intravenous Q12H  . [DISCONTINUED] heparin  5,000 Units Subcutaneous Q8H    Assessment: 57 y.o male with recent history of cerebellar stroke on heparin for ACS and positive troponin I . HL this AM 0.18. H/H 9.7/29.9 stable PLTS 274 and no noted bleeding per RN. Noted documented history of HIT? will follow platelets closely.  Goal of Therapy:  Heparin level  0.3-0.5 Monitor platelets by anticoagulation protocol: Yes   Plan:  1) Increase heparin drip to 2000 units/hour. No bolus. 2) 6hr heparin level and daily CBC 3) Monitor platelets and s/sx of bleeding  Bola A. [place], [place] Pharmacist Pager:[phone] Phone [phone] 03/13/2012 7:51 AM

## 2012-03-13 NOTE — Progress Notes (Signed)
Pt had a 6 beats of VT once, DR. Doylene Canard notified, order for IV Magnesium gotten. We'll continue to monitor.

## 2012-03-13 NOTE — Progress Notes (Signed)
ANTICOAGULATION CONSULT NOTE - Follow Up Consult  Pharmacy Consult for heparin Indication: chest pain/ACS  Labs:  Recent Labs  03/12/12 1429 03/12/12 1818  03/12/12 2240 03/13/12 0614 03/13/12 1054 03/13/12 1344 03/13/12 2136  HGB 9.8* 9.4*  --   --  9.7*  --   --   --   HCT 30.3* 29.3*  --   --  29.9*  --   --   --   PLT 287 281  --   --  274  --   --   --   HEPARINUNFRC  --   --   < > 0.11* 0.18*  --  0.26* 0.44  CREATININE 1.23 1.19  --   --  1.26  --   --   --   TROPONINI  --   --   --  <0.30 <0.30 <0.30  --   --   < > = values in this interval not displayed.   Assessment/Plan:  57yo male now therapeutic on heparin with low goal.  Will continue gtt at current rate and confirm stable with am labs.  Rogue Bussing PharmD BCPS 03/13/2012,10:43 PM

## 2012-03-14 ENCOUNTER — Encounter (HOSPITAL_COMMUNITY)
Admission: EM | Disposition: A | Discharge status: Home Health | Payer: Self-pay | Source: Home / Self Care | Attending: Cardiovascular Disease

## 2012-03-14 LAB — CBC
HCT: 30.1 % — ABNORMAL LOW (ref 39.0–52.0)
MCHC: 32.6 g/dL (ref 30.0–36.0)
MCV: 92.9 fL (ref 78.0–100.0)
Platelets: 256 10*3/uL (ref 150–400)
RDW: 14.4 % (ref 11.5–15.5)
WBC: 6.4 10*3/uL (ref 4.0–10.5)

## 2012-03-14 LAB — BASIC METABOLIC PANEL
BUN: 18 mg/dL (ref 6–23)
Chloride: 107 mEq/L (ref 96–112)
Creatinine, Ser: 1.35 mg/dL (ref 0.50–1.35)
GFR calc Af Amer: 65 mL/min — ABNORMAL LOW (ref >90)
GFR calc non Af Amer: 56 mL/min — ABNORMAL LOW (ref >90)

## 2012-03-14 LAB — HEPARIN LEVEL (UNFRACTIONATED): Heparin Unfractionated: 0.47 IU/mL (ref 0.30–0.70)

## 2012-03-14 SURGERY — LEFT AND RIGHT HEART CATHETERIZATION WITH CORONARY ANGIOGRAM
Anesthesia: LOCAL

## 2012-03-14 MED ORDER — POTASSIUM CHLORIDE CRYS ER 10 MEQ PO TBCR: 10.0000 meq | EXTENDED_RELEASE_TABLET | Freq: Every day | ORAL | Status: DC

## 2012-03-14 MED ORDER — ASPIRIN 81 MG PO TABS: 81.0000 mg | ORAL_TABLET | Freq: Every day | ORAL | Status: DC

## 2012-03-14 MED ORDER — FUROSEMIDE 40 MG PO TABS: 40.0000 mg | ORAL_TABLET | Freq: Every day | ORAL | Status: DC

## 2012-03-14 NOTE — Discharge Summary (Signed)
Physician Discharge Summary  Patient ID: [name] [name] MRN: [id number] DOB/AGE: 58-Sep-1957 58 y.o.  Admit date: 03/12/2012 Discharge date: 03/14/2012  Admission Diagnoses: Acute left heart systolic failure.  Recent stroke-Cerebellar infarcts and possible left brain stem infarct.  Obesity  Hypertension  Schizophrenia  H/O alcohol and drug abuse  Hypokalemia  Nonsustained VT.  Discharge Diagnoses:  Principle Problem:  * Acute left heart systolic failure*.  Recent stroke-Cerebellar infarcts and possible left brain stem infarct.  Obesity  Hypertension  Schizophrenia  H/O alcohol and drug abuse  Hypokalemia  Nonsustained VT. Chronic anemia  Discharged Condition: fair  Hospital Course: 57 years old male with excess fluid intake and possible ischemic cardiomyopathy had leg edema and shortness of breath. He responded to IV lasix. He understood to decrease fluid and food intake by about 50 %, walk daily, weight daily and get f/u by primary care at [facility] out patient clinic in 1 week.  Consults: cardiology  Significant Diagnostic Studies: labs: Normal CBC except Hgb  Of 9.8. G/dl. Normal electrolytes post K+ supplementation   - Left ventricle: The cavity size was normal. There was mild concentric hypertrophy. Systolic function was moderately reduced. The estimated ejection fraction was in the range of 35% to 40%. There is mild hypokinesis of the entire myocardium. Doppler parameters are consistent with abnormal left ventricular relaxation (grade 1 diastolic dysfunction). - Mitral valve: Calcified annulus. Mild regurgitation. - Left atrium: The atrium was moderately dilated. - Right ventricle: Systolic function was mildly reduced.   Treatments: cardiac meds: lisinopril (Prinivil), carvedilol and furosemide  Discharge Exam: Blood pressure 141/97, pulse 57, temperature 98.4 F (36.9 C), temperature source Oral, resp. rate 18, height 5\' 11"  (1.803 m), weight 116.166 kg (256 lb 1.6 oz),  SpO2 100.00%. HEENT: [place]/AT, Eyes-Brown, PERL, EOMI, Conjunctiva-Pale pink, Sclera-Non-icteric  Neck: No JVD, No bruit, Trachea midline.  Lungs: Clear, Bilateral.  Cardiac: Regular rhythm, normal S1 and S2, no S3.  Abdomen: Soft, non-tender.  Extremities: Trace to 1 + edema present. No cyanosis. No clubbing.  CNS: AxOx3, Cranial nerves grossly intact, moves all 4 extremities. Right handed.  Skin: Warm and dry.   Disposition: 01-Home or Self Care   Future Appointments Provider Department Dept Phone   03/15/2012 1:00 PM Hulda Marin, [place] [phone]   03/15/2012 1:45 PM London Pepper, [place] [phone]   03/29/2012 10:45 AM Charlett Blake, MD [facility] [phone]   04/14/2012 8:45 AM Dominic Pea, DO [place] [phone]       Medication List    STOP taking these medications       labetalol 100 MG tablet  Commonly known as:  NORMODYNE      TAKE these medications       ARIPiprazole 10 MG tablet  Commonly known as:  ABILIFY  Take 10 mg by mouth daily.     aspirin 81 MG tablet  Take 1 tablet (81 mg total) by mouth daily.     carvedilol 6.25 MG tablet  Commonly known as:  COREG  Take 1 tablet (6.25 mg total) by mouth 2 (two) times daily with a meal.     cloNIDine 0.3 MG tablet  Commonly known as:  CATAPRES  Take 1 tablet (0.3 mg total) by mouth 2 (two) times daily.     fenofibrate 48 MG tablet  Commonly known as:  TRICOR  Take 48 mg by mouth daily.     folic acid  1 MG tablet  Commonly known as:  FOLVITE  Take 1 tablet (1 mg total) by mouth daily.     furosemide 40 MG tablet  Commonly known as:  LASIX  Take 1 tablet (40 mg total) by mouth daily.     isosorbide-hydrALAZINE 20-37.5 MG per tablet  Commonly known as:  BIDIL  Take 1 tablet by mouth 3 (three) times daily.     lisinopril 20 MG tablet   Commonly known as:  PRINIVIL,ZESTRIL  Take 1 tablet (20 mg total) by mouth daily.     magnesium oxide 400 (241.3 MG) MG tablet  Commonly known as:  MAG-OX  Take 0.5 tablets (200 mg total) by mouth 2 (two) times daily.     multivitamin with minerals Tabs  Take 1 tablet by mouth daily.     potassium chloride 10 MEQ tablet  Commonly known as:  K-DUR,KLOR-CON  Take 1 tablet (10 mEq total) by mouth daily.     traMADol 50 MG tablet  Commonly known as:  ULTRAM  Take 50 mg by mouth every 6 (six) hours as needed. For pain           Follow-up Information   Follow up with PAYA, ALEJANDRO, DO. Schedule an appointment as soon as possible for a visit in 1 week.   Contact information:   [place] [address] [phone]       Follow up with [facility], MD. Schedule an appointment as soon as possible for a visit in 1 week.   Contact information:   [place] [address] [phone]       Signed: Birdie Riddle 03/14/2012, 9:47 AM

## 2012-03-14 NOTE — Progress Notes (Signed)
Utilization Review Completed.   Dellas Guard, RN, BSN Nurse Case Manager  [phone]  

## 2012-03-14 NOTE — Progress Notes (Signed)
Patient evaluated for long-term disease management services with [facility]. Unfortunately, patient discharged prior to visit. Will receive post transition of care call and evaluated for monthly home visits.   Marthenia Rolling, MSN- Ed, RN,BSN, [facility], [phone]

## 2012-03-14 NOTE — Progress Notes (Signed)
Pt D/c to home w/sister. D/c instructions and medications reviewed with PT. Pt states understanding. All questions answered

## 2012-03-14 NOTE — Care Management Note (Signed)
    Page 1 of 1   03/14/2012     1:24:45 PM   CARE MANAGEMENT NOTE 03/14/2012  Patient:  [name] [name], [name] [name]   Account Number:  [id number]  Date Initiated:  03/14/2012  Documentation initiated by:  Llana Aliment  Subjective/Objective Assessment:   57yo male admitted with CHF.  Pt. lives with sister.     Action/Plan:   In to speak with pt. about Heart Failure [place].  Pt. 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   Anticipated DC Date:  03/15/2012   Anticipated DC Plan:  [place]  CM consult      Choice offered to / List presented to:             Status of service:  In process, will continue to follow Medicare Important Message given?   (If response is "NO", the following Medicare IM given date fields will be blank) Date Medicare IM given:   Date Additional Medicare IM given:    Discharge Disposition:    Per UR Regulation:  Reviewed for med. necessity/level of care/duration of stay  If discussed at [place] of Stay Meetings, dates discussed:    Comments:  03/14/12 1130 In to speak with pt. about [facility] services.  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 for services.  Pt. may be dc home tomorrow. Llana Aliment, RN, BSN NCM [phone]

## 2012-03-14 NOTE — Progress Notes (Signed)
ANTICOAGULATION CONSULT NOTE - Follow Up Consult  Pharmacy Consult for heparin Indication: chest pain/ACS  Labs:  Recent Labs  03/12/12 1818  03/12/12 2240 03/13/12 0614 03/13/12 1054 03/13/12 1344 03/13/12 2136 03/14/12 0530  HGB 9.4*  --   --  9.7*  --   --   --  9.8*  HCT 29.3*  --   --  29.9*  --   --   --  30.1*  PLT 281  --   --  274  --   --   --  256  HEPARINUNFRC  --   < > 0.11* 0.18*  --  0.26* 0.44 0.47  CREATININE 1.19  --   --  1.26  --   --   --  1.35  TROPONINI  --   --  <0.30 <0.30 <0.30  --   --   --   < > = values in this interval not displayed.   Assessment/Plan:  57yo male continues to be therapeutic on heparin with low goal. No bleeding complications noted. Discharge plans noted.  Georgina Peer PharmD BCPS 03/14/2012,10:28 AM

## 2012-03-15 ENCOUNTER — Ambulatory Visit: Payer: PRIVATE HEALTH INSURANCE | Admitting: Occupational Therapy

## 2012-03-15 ENCOUNTER — Ambulatory Visit: Payer: PRIVATE HEALTH INSURANCE | Admitting: Physical Therapy

## 2012-03-17 ENCOUNTER — Ambulatory Visit: Payer: PRIVATE HEALTH INSURANCE | Admitting: Internal Medicine

## 2012-03-21 ENCOUNTER — Encounter: Payer: Self-pay | Admitting: Internal Medicine

## 2012-03-21 ENCOUNTER — Ambulatory Visit (INDEPENDENT_AMBULATORY_CARE_PROVIDER_SITE_OTHER): Payer: PRIVATE HEALTH INSURANCE | Admitting: Internal Medicine

## 2012-03-21 VITALS — BP 180/100 | HR 67 | Temp 97.5°F | Ht 71.0 in | Wt 246.3 lb

## 2012-03-21 DIAGNOSIS — I10 Essential (primary) hypertension: Secondary | ICD-10-CM

## 2012-03-21 LAB — CBC WITH DIFFERENTIAL/PLATELET
Basophils Absolute: 0 10*3/uL (ref 0.0–0.1)
Eosinophils Relative: 5 % (ref 0–5)
HCT: 34.2 % — ABNORMAL LOW (ref 39.0–52.0)
Lymphocytes Relative: 27 % (ref 12–46)
Lymphs Abs: 1.9 10*3/uL (ref 0.7–4.0)
MCV: 87.7 fL (ref 78.0–100.0)
Monocytes Absolute: 0.8 10*3/uL (ref 0.1–1.0)
Neutro Abs: 4.1 10*3/uL (ref 1.7–7.7)
RBC: 3.9 MIL/uL — ABNORMAL LOW (ref 4.22–5.81)
RDW: 15.4 % (ref 11.5–15.5)
WBC: 7.1 10*3/uL (ref 4.0–10.5)

## 2012-03-21 LAB — COMPLETE METABOLIC PANEL WITH GFR
ALT: 27 U/L (ref 0–53)
AST: 17 U/L (ref 0–37)
Alkaline Phosphatase: 89 U/L (ref 39–117)
BUN: 15 mg/dL (ref 6–23)
Creat: 1.22 mg/dL (ref 0.50–1.35)
Total Bilirubin: 0.5 mg/dL (ref 0.3–1.2)

## 2012-03-21 LAB — CK: Total CK: 159 U/L (ref 7–232)

## 2012-03-21 MED ORDER — LISINOPRIL 5 MG PO TABS: 5.0000 mg | ORAL_TABLET | Freq: Every day | ORAL | Status: DC

## 2012-03-21 MED ORDER — TRAZODONE HCL 100 MG PO TABS: 100.0000 mg | ORAL_TABLET | Freq: Every day | ORAL | Status: DC

## 2012-03-21 MED ORDER — HYDROCODONE-ACETAMINOPHEN 5-300 MG PO TABS: ORAL_TABLET | ORAL | Status: DC

## 2012-03-21 NOTE — Assessment & Plan Note (Addendum)
Symptomatically improving. Neurological exam today did not show any acute neurological deficit except for his right eye lateral deviation. He is currently taking aspirin 325 mg daily. He will has a followup appointment with Dr. Leonie Man neurology on 2/27. He reported having a chronic headache since he had a stroke in the tramadol is not working. He is unable to take NSAIDs do to his CKD. Is not able to take Tylenol given his transaminitis and history of cirrhosis. -I gave patient a short course of Vicodin 5/300 mg Q64 hours when necessary #30 no refill -I informed patient that if he needs chronic narcotics for his headache as well as back pain, he will need to discuss that with his PCP for possible pain contract

## 2012-03-21 NOTE — Assessment & Plan Note (Addendum)
On admission on 02/21/2011, patient did have CK of greater than 50,000 but trended down to 3673. Unclear etiology, but he is on fenofibrate.  He denies any myalgia today. -Will repeat total CK today

## 2012-03-21 NOTE — Patient Instructions (Addendum)
You can take Vicodin 5/300mg  one tablet every 4 hours as needed for pain Start Trazodone 100mg  at bed time for sleep Will get labs today and I will call you with any abnormalities Follow up with Dr. Murlean Caller in March

## 2012-03-21 NOTE — Progress Notes (Signed)
Patient ID: [name] [name], male   DOB: Dec 18, 1955, 57 y.o.   MRN: [id number] History of present illness: [name] [name] is a 57 year old man with past medical history of schizophrenia, substance abuse, cirrhosis, hep C, hypertension, cerebellar as well as left brain stem stroke. During that admission, patient also was found to have CK greater than 50,000 which trended down to 3600's, transaminitis with AST as high as 4600, ALT 1600 which was thought due to hypotension, he was also had acute renal failure with creatinine of 2.3 on admission which trended up to 9.59 which trended back down to 4.37 by time of discharge. He was then sent to inpatient rehabilitation and was presented to the hospital on 2/15 to 2/17 for shortness of breath and was admitted by cardiology. He was placed on Lasix 40 mg daily. His creatinine has now trended down back to 1.35. Since hospital discharge, he reports slowly improving, he is able to get around better.  He has residual left eyes pain and headache from his stroke. Headache is on the top which runs down to occipital area.  This is not a new problem for him and states that they tried tramadol as well as Tylenol but does not improve his headache. He is unable to take NSAIDs do to his chronic kidney disease.  Wants something stronger for his headache as well as back. He has chronic back pain s/p back surgery by [facility]. As for his residual from the stroke, he still has mild right arm numbness as well as left-sided numbness. He also has trouble with his vision but this is not new. He is making urine ok. No dysuria or flank pain. He denies any fever chills, nausea vomiting.  He wants his trazodone refill today. Not sure why it was not been refilled.  Review of system: as per HPI  PHYSICAL EXAMINATION: General: alert, well-developed, and cooperative to examination.  Lungs: normal respiratory effort, no accessory muscle use, normal breath sounds, no crackles, and no  wheezes. Heart: normal rate, regular rhythm, no murmur, no gallop, and no rub.  Abdomen: soft, non-tender, normal bowel sounds, no distention, no guarding, no rebound tenderness.  Neurologic: nonfocal except for right eye lateral deviation. Motor strength 5 out 5 in both upper and lower extremity. Intact sensation.  Skin: turgor normal and no rashes.  Psych: appropriate

## 2012-03-21 NOTE — Assessment & Plan Note (Addendum)
2-D echo 02/20/2012 showed EF of 30-35%. Grade 1 diastolic dysfunction. This possible hypokinesis of the mid inferolateral myocardium. He also had a TEE which showed a "medium-sized valve-incompetent patent foramen ovale. Doppler and agitated saline contrast study showed a moderate right-to-left shunt through a patent foramen ovale, in the baseline state.  Cardiac source of cerebral embolism cannot be excluded (paradoxical source in a patient with elevated right heart pressure) The right ventricular systolic pressure was increased consistent with severe pulmonary hypertension."  -Volume status looks good, weight is down 10 pounds from 256 to 246; therefore, will continue Lasix 40mg  qd -Will continue Coreg 6.25 mg by mouth twice a day -Will resume lisinopril 5 mg by mouth daily, will need to titrate -Continue fenofibrate if his CK is normal -Will refer patient to Dr. Sung Amabile with cardiology per patient's request

## 2012-03-21 NOTE — Assessment & Plan Note (Signed)
This was thought likely due to hypotension during hospital course. Patient does have chronic hepatitis C and cirrhosis. Otherwise his acute hepatitis panel was negative. -I will repeat liver function test today -Will avoid Tylenol

## 2012-03-21 NOTE — Assessment & Plan Note (Addendum)
BP Readings from Last 3 Encounters:  03/21/12 180/100  03/14/12 130/75  03/14/12 130/75    Lab Results  Component Value Date   NA 141 03/14/2012   K 4.3 03/14/2012   CREATININE 1.35 03/14/2012    Assessment:  Blood pressure control:   not well-controlled. He reports medication compliance.  Progress toward BP goal:    no   Plan:  Medications:  Will resume lisinopril 5 mg by mouth daily and repeat a BMP today.   his creatinine on 2/17 was 1.35. Given that he does have heart failure, we will need to maximize the use of ACE inhibitor. It was held during hospitalization given his acute renal failure.   -Will continue Coreg 6.25 mg by mouth twice a day  -Will continue BiDil 20/37.5mg  TID    Educational resources provided:  Yes  Self management tools provided:  Yes  Other plans: Check BMP today and also repeat BMP in 10 days to make sure that his electrolytes are within normal limit

## 2012-03-21 NOTE — Assessment & Plan Note (Addendum)
Patient did have acute renal failure during hospital course however his creatinine has trended down to 1.35 on 03/14/12. He does have baseline creatinine of 1.3. -Will repeat cmp today

## 2012-03-24 ENCOUNTER — Ambulatory Visit: Payer: PRIVATE HEALTH INSURANCE | Attending: Physical Medicine & Rehabilitation | Admitting: Occupational Therapy

## 2012-03-24 ENCOUNTER — Ambulatory Visit: Payer: PRIVATE HEALTH INSURANCE | Admitting: Physical Therapy

## 2012-03-24 DIAGNOSIS — I69998 Other sequelae following unspecified cerebrovascular disease: Secondary | ICD-10-CM | POA: Insufficient documentation

## 2012-03-24 DIAGNOSIS — I69919 Unspecified symptoms and signs involving cognitive functions following unspecified cerebrovascular disease: Secondary | ICD-10-CM | POA: Insufficient documentation

## 2012-03-24 DIAGNOSIS — M6281 Muscle weakness (generalized): Secondary | ICD-10-CM | POA: Insufficient documentation

## 2012-03-24 DIAGNOSIS — Z5189 Encounter for other specified aftercare: Secondary | ICD-10-CM | POA: Insufficient documentation

## 2012-03-24 DIAGNOSIS — R269 Unspecified abnormalities of gait and mobility: Secondary | ICD-10-CM | POA: Insufficient documentation

## 2012-03-29 ENCOUNTER — Inpatient Hospital Stay: Payer: PRIVATE HEALTH INSURANCE | Admitting: Physical Medicine & Rehabilitation

## 2012-03-29 ENCOUNTER — Ambulatory Visit: Payer: PRIVATE HEALTH INSURANCE | Admitting: Physical Therapy

## 2012-03-29 ENCOUNTER — Encounter: Payer: Self-pay | Admitting: Occupational Therapy

## 2012-03-30 NOTE — Addendum Note (Signed)
Addended by: Hulan Fray on: 03/30/2012 07:07 PM   Modules accepted: Orders

## 2012-03-31 ENCOUNTER — Ambulatory Visit (INDEPENDENT_AMBULATORY_CARE_PROVIDER_SITE_OTHER): Payer: PRIVATE HEALTH INSURANCE | Admitting: Internal Medicine

## 2012-03-31 ENCOUNTER — Encounter: Payer: Self-pay | Admitting: Internal Medicine

## 2012-03-31 VITALS — BP 125/73 | HR 99 | Temp 97.9°F | Ht 71.0 in | Wt 249.0 lb

## 2012-03-31 DIAGNOSIS — N179 Acute kidney failure, unspecified: Secondary | ICD-10-CM

## 2012-03-31 DIAGNOSIS — I509 Heart failure, unspecified: Secondary | ICD-10-CM

## 2012-03-31 DIAGNOSIS — I639 Cerebral infarction, unspecified: Secondary | ICD-10-CM

## 2012-03-31 DIAGNOSIS — I10 Essential (primary) hypertension: Secondary | ICD-10-CM

## 2012-03-31 NOTE — Assessment & Plan Note (Signed)
Headache improving.  Did not refill opioid analgesics.  Recommended he go back to using tramadol as before since the headache severity is steadily decreasing.  I advised he can add Tylenol to this if he needs additional analgesia.  I cautioned him to take no more than 500mg  at once and to not exceed 2,000mg  in one day.

## 2012-03-31 NOTE — Progress Notes (Signed)
  Subjective:    Patient ID: [name] [name], male    DOB: 1956/01/12, 57 y.o.   MRN: [id number]  HPI:  This is a 57 year old man who was hospitalized at the end of January for a stroke and again later in February for an acute heart failure exacerbation.  He was seen for initial follow up on February 24th.  At that visit, the patient's renal function had normalized, which had been quite impaired during his last hospitalization (max creatinine 9.4); so his lisinopril was restarted.  He presents to clinic today for blood pressure recheck and renal function monitoring.  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  They have been gradually improving.  At his last visit, he was prescribed 30 tablets of hydrocodone-acetaminophen, which he has finished.  He is requesting analgesia for his headaches today.  As for his heart failure, he reports being at baseline from a respiratory status and has stable 2-pillow orthopnea.  He still has fatigue and mild dyspnea with exertion; he thinks he can walk around 30 yards before becoming symptomatic.   Review of Systems  Constitutional: Negative.  Negative for fever and chills.  Respiratory: Negative for cough and shortness of breath.   Cardiovascular: Negative for chest pain and palpitations.  Gastrointestinal: Negative for nausea, vomiting and abdominal pain.  Neurological: Positive for headaches. Negative for dizziness.    Current Medications: 1. Aripiprazole 10mg  QD 2. Aspirin 81mg  QD 3. Carvedilol 6.25mg  BID 4. Clonidine 0.3mg  BID 5. Fenofibrate 48mg  QD 6. Folic acid 1mg  QD 7. Furosemide 40mg  QD 8. Hydrocodone-acetaminophen 5-300mg  every 4 hours as needed (now completed) 9. Isosorbide-hydralazine 20-37.5mg  TID 10. Lisinopril 5mg  QD 11. Magnesium oxide 200mg  BID 12. Daily multivitamin 13. Potassium chloride 75mEq QD 14. Trazodone 100mg  qHS     Objective:   Physical Exam:   GENERAL: overweight; no acute distress HEAD: atraumatic, normocephalic EYES: right pupil irregular and non-reactive (from cataracts), left pupil round and reactive, conjunctiva clear, sclera anicteric EARS: canals patent and TMs normal bilaterally NOSE/THROAT: oropharynx clear, moist mucous membranes, pink gums NECK: supple, thyroid normal in size and without palpable nodules LYMPH: no cervical or supraclavicular lymphadenopathy LUNGS: clear to auscultation bilaterally, normal work of breathing HEART: normal rate and regular rhythm; normal S1 and S2 without S3 or S4; no murmurs, rubs, or clicks PULSES: radial 2+ and symmetric ABDOMEN: soft, non-tender, normal bowel sounds SKIN: warm, dry, intact, normal turgor, no rashes EXTREMITIES: no peripheral edema, clubbing, or cyanosis PSYCH: patient is alert and oriented, mood and affect are normal and congruent   Filed Vitals:   03/31/12 1356  BP: 125/73  Pulse: 99  Temp: 97.9 F (36.6 C)    BP Readings from Last 3 Encounters:  03/31/12 125/73  03/21/12 180/100  03/14/12 130/75    BMET Component Value Date/Time   NA 140 03/21/2012 1635   K 4.4 03/21/2012 1635   CL 106 03/21/2012 1635   CO2 26 03/21/2012 1635   GLUCOSE 84 03/21/2012 1635   BUN 15 03/21/2012 1635   CREATININE 1.22 03/21/2012 1635   CALCIUM 9.9 03/21/2012 1635        Assessment & Plan:

## 2012-03-31 NOTE — Assessment & Plan Note (Signed)
BP Readings from Last 3 Encounters:  03/31/12 125/73  03/21/12 180/100  03/14/12 130/75    Lab Results  Component Value Date   NA 140 03/21/2012   K 4.4 03/21/2012   CREATININE 1.22 03/21/2012    Assessment:  Blood pressure control: controlled  Progress toward BP goal:  at goal  Plan:  Medications:  continue current medications  Educational resources provided: handout

## 2012-03-31 NOTE — Patient Instructions (Signed)
General Instructions:  Keep up the good work with your blood pressure.  Start taking tramadol for your headaches if needed.  If you need extra pain relief, you may take small doses of tylenol (no more than 500 mg at a time and no more than 2,000 mg in one day).    Treatment Goals:  Goals (1 Years of Data) as of 03/31/12         As of Today 03/21/12 03/21/12 03/14/12 03/14/12     Blood Pressure    . Blood Pressure < 140/90  125/73 180/100 162/93 130/75 141/97      Progress Toward Treatment Goals:  Treatment Goal 03/31/2012  Blood pressure at goal    Self Care Goals & Plans:  Self Care Goal 03/31/2012  Manage my medications take my medicines as prescribed; refill my medications on time  Monitor my health keep track of my blood pressure  Eat healthy foods eat foods that are low in salt; eat baked foods instead of fried foods; drink diet soda or water instead of juice or soda  Be physically active (No Data)       Care Management & Community Referrals:  Referral 03/31/2012  Referrals made for care management support none needed

## 2012-03-31 NOTE — Assessment & Plan Note (Addendum)
Resolved.  Will recheck renal function with BMET after restating lisinopril. - BMET  ADDENDUM: Renal function has normalized. Creatinine back to baseline. eGFR > 89.  BMET Component Value Date/Time   NA 137 03/31/2012 1505   K 4.3 03/31/2012 1505   CL 107 03/31/2012 1505   CO2 23 03/31/2012 1505   GLUCOSE 99 03/31/2012 1505   BUN 14 03/31/2012 1505   CREATININE 0.96 03/31/2012 1505   CALCIUM 9.6 03/31/2012 1505   GFRAA >89 03/31/2012 1505

## 2012-04-01 ENCOUNTER — Ambulatory Visit: Payer: PRIVATE HEALTH INSURANCE | Admitting: Physical Therapy

## 2012-04-01 LAB — BASIC METABOLIC PANEL WITH GFR
BUN: 14 mg/dL (ref 6–23)
CO2: 23 mEq/L (ref 19–32)
Chloride: 107 mEq/L (ref 96–112)
Creat: 0.96 mg/dL (ref 0.50–1.35)
GFR, Est Non African American: 87 mL/min
Potassium: 4.3 mEq/L (ref 3.5–5.3)

## 2012-04-04 ENCOUNTER — Ambulatory Visit: Payer: PRIVATE HEALTH INSURANCE | Attending: Physical Medicine & Rehabilitation | Admitting: Physical Therapy

## 2012-04-04 DIAGNOSIS — Z5189 Encounter for other specified aftercare: Secondary | ICD-10-CM | POA: Insufficient documentation

## 2012-04-04 DIAGNOSIS — M6281 Muscle weakness (generalized): Secondary | ICD-10-CM | POA: Insufficient documentation

## 2012-04-04 DIAGNOSIS — I69919 Unspecified symptoms and signs involving cognitive functions following unspecified cerebrovascular disease: Secondary | ICD-10-CM | POA: Insufficient documentation

## 2012-04-04 DIAGNOSIS — I69998 Other sequelae following unspecified cerebrovascular disease: Secondary | ICD-10-CM | POA: Insufficient documentation

## 2012-04-04 DIAGNOSIS — R269 Unspecified abnormalities of gait and mobility: Secondary | ICD-10-CM | POA: Insufficient documentation

## 2012-04-07 ENCOUNTER — Ambulatory Visit: Payer: PRIVATE HEALTH INSURANCE | Admitting: Occupational Therapy

## 2012-04-07 ENCOUNTER — Ambulatory Visit: Payer: PRIVATE HEALTH INSURANCE | Admitting: Physical Therapy

## 2012-04-07 ENCOUNTER — Ambulatory Visit (HOSPITAL_COMMUNITY)
Admission: RE | Admit: 2012-04-07 | Discharge: 2012-04-07 | Disposition: A | Discharge status: Home / Self Care | Payer: PRIVATE HEALTH INSURANCE | Source: Ambulatory Visit | Attending: Internal Medicine | Admitting: Internal Medicine

## 2012-04-07 VITALS — BP 160/96 | HR 62 | Wt 230.8 lb

## 2012-04-07 DIAGNOSIS — I5022 Chronic systolic (congestive) heart failure: Secondary | ICD-10-CM

## 2012-04-07 DIAGNOSIS — I509 Heart failure, unspecified: Secondary | ICD-10-CM | POA: Insufficient documentation

## 2012-04-07 DIAGNOSIS — I10 Essential (primary) hypertension: Secondary | ICD-10-CM | POA: Insufficient documentation

## 2012-04-07 MED ORDER — CLONIDINE HCL 0.2 MG PO TABS: 0.2000 mg | ORAL_TABLET | Freq: Two times a day (BID) | ORAL | Status: DC

## 2012-04-07 MED ORDER — ISOSORB DINITRATE-HYDRALAZINE 20-37.5 MG PO TABS: 2.0000 | ORAL_TABLET | Freq: Three times a day (TID) | ORAL | Status: DC

## 2012-04-07 MED ORDER — POTASSIUM CHLORIDE CRYS ER 10 MEQ PO TBCR: 10.0000 meq | EXTENDED_RELEASE_TABLET | Freq: Two times a day (BID) | ORAL | Status: DC

## 2012-04-07 NOTE — Progress Notes (Signed)
Referring Physician: Dr. Silverio Decamp Primary Care: Internal Medicine clinic, Dr. Silverio Decamp Primary Cardiologist: Dr. Doylene Canard   HPI: [name] [name] is a 57 y.o. 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  History of substance abuse but quit smoking cigarettes 3 years ago after smoking 25 years, alcohol use but not any mor as well as history of cocaine use.  Had history of acute renal failure in setting of rhamdo requiring short term HD.  Denies DM  He was recently admitted with new onset HF.  EF 35-40%.  He was diuresed and discharged home.  Discharge weight 256 pounds. Echo at the time showed EF 123456, grade 1 diastolic dysfunction.    He is here to establish care in the HF clinic (uncle of pt [name] [name]).  He says that since discharge he has noted increased fatigue since discharge.  He says he gets dyspnea with ambulation.  He also has some chest pressure when he gets tired.  This goes away with rest.  +orthopnea.  No PND.  Snores some.   Lives alone.     Review of Systems: [y] = yes, [ ]  = no   General: Weight gain [ ] ; Weight loss [ ] ; Anorexia [ ] ; Fatigue [ ] ; Fever [ ] ; Chills [ ] ; Weakness [ ]   Cardiac: Chest pain/pressure [ ] ; Resting SOB [ ] ; Exertional SOB [ ] ; Orthopnea [ ] ; Pedal Edema [ ] ; Palpitations [ ] ; Syncope [ ] ; Presyncope [ ] ; Paroxysmal nocturnal dyspnea[ ]   Pulmonary: Cough [ ] ; Wheezing[ ] ; Hemoptysis[ ] ; Sputum [ ] ; Snoring [ ]   GI: Vomiting[ ] ; Dysphagia[ ] ; Melena[ ] ; Hematochezia [ ] ; Heartburn[ ] ; Abdominal pain [ ] ; Constipation [ ] ; Diarrhea [ ] ; BRBPR [ ]   GU: Hematuria[ ] ; Dysuria [ ] ; Nocturia[ ]   Vascular: Pain in legs with walking [ ] ; Pain in feet with lying flat [ ] ; Non-healing sores [ ] ; Stroke [ ] ; TIA [ ] ; Slurred speech [ ] ;  Neuro: Headaches[ ] ; Vertigo[ ] ; Seizures[ ] ; Paresthesias[ ] ;Blurred vision [ ] ; Diplopia [ ] ; Vision changes [ ]   Ortho/Skin: Arthritis [ ] ; Joint pain [ ] ; Muscle pain [ ] ; Joint  swelling [ ] ; Back Pain [ ] ; Rash [ ]   Psych: Depression[ ] ; Anxiety[ ]   Heme: Bleeding problems [ ] ; Clotting disorders [ ] ; Anemia [ ]   Endocrine: Diabetes [ ] ; Thyroid dysfunction[ ]    Past Medical History  Diagnosis Date  . H/O ETOH abuse   . Colitis   . GERD (gastroesophageal reflux disease)   . Hepatitis     HEP  C  . Cocaine abuse     HISTORY OF  . 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 Diverticulitis     H/O  . Headache   . Arthritis   . Schizophrenia, schizo-affective   . Schizo affective schizophrenia     TAKES ABILIFY  IF  NEEDED  . Cataract   . Hyperlipidemia   . Wears glasses   . Wears dentures     upper  . Hypertension   . Schizophrenia   . Hepatitis C   . HIT (heparin-induced thrombocytopenia)   . H/O: alcohol abuse   . H/O cocaine abuse   . Stroke   . CHF (congestive heart failure) EF 30-35% 02/29/2012    Current Outpatient Prescriptions  Medication Sig Dispense Refill  . ARIPiprazole (ABILIFY) 10 MG tablet Take 10 mg by mouth daily.      Marland Kitchen  aspirin 81 MG tablet Take 1 tablet (81 mg total) by mouth daily.      . carvedilol (COREG) 6.25 MG tablet Take 1 tablet (6.25 mg total) by mouth 2 (two) times daily with a meal.  60 tablet  1  . cloNIDine (CATAPRES) 0.3 MG tablet Take 1 tablet (0.3 mg total) by mouth 2 (two) times daily.  60 tablet  1  . fenofibrate (TRICOR) 48 MG tablet Take 48 mg by mouth daily.      . folic acid (FOLVITE) 1 MG tablet Take 1 tablet (1 mg total) by mouth daily.  30 tablet  1  . furosemide (LASIX) 40 MG tablet Take 1 tablet (40 mg total) by mouth daily.  30 tablet  1  . isosorbide-hydrALAZINE (BIDIL) 20-37.5 MG per tablet Take 1 tablet by mouth 3 (three) times daily.  90 tablet  1  . lisinopril (PRINIVIL,ZESTRIL) 20 MG tablet Take 20 mg by mouth daily.      . magnesium oxide (MAG-OX) 400 (241.3 MG) MG tablet Take 0.5 tablets (200 mg total) by mouth 2 (two) times daily.  60 tablet  1  . Multiple Vitamin  (MULTIVITAMIN WITH MINERALS) TABS Take 1 tablet by mouth daily.  30 tablet  1  . potassium chloride (K-DUR,KLOR-[name]) 10 MEQ tablet Take 1 tablet (10 mEq total) by mouth daily.  30 tablet  1  . traMADol (ULTRAM) 50 MG tablet Take 50 mg by mouth every 6 (six) hours as needed for pain.      . traZODone (DESYREL) 100 MG tablet Take 1 tablet (100 mg total) by mouth at bedtime.  31 tablet  3   No current facility-administered medications for this encounter.    Allergies  Allergen Reactions  . Thorazine (Chlorpromazine Hcl) Other (See Comments)    Body freezes up    History   Social History  . Marital Status: Single    Spouse Name: N/A    Number of Children: N/A  . Years of Education: N/A   Occupational History  . Not on file.   Social History Main Topics  . Smoking status: Former Research scientist (life sciences)  . Smokeless tobacco: Not on file  . Alcohol Use: Yes     Comment: frequently-- 1/5th a day  . Drug Use: No     Comment: patient denies  . Sexually Active: Yes    Birth Control/ Protection: Condom     Comment: 2-3 new male partners a year   Other Topics Concern  . Not on file   Social History Narrative   ** Merged History Encounter **        Family History  Problem Relation Age of Onset  . Heart disease Mother   . Heart disease Father   . Heart disease Sister   . CAD Mother   . CAD Father   . CAD Sister     PHYSICAL EXAM: Filed Vitals:   04/07/12 1110  BP: 160/96  Pulse: 62  Weight: 230 lb 12 oz (104.668 kg)  SpO2: 97%    General:  Chronically ill appearing. No respiratory difficulty HEENT: normal Neck: supple. JVP 11-12.  Carotids 2+ bilat; no bruits. No lymphadenopathy or thryomegaly appreciated. Cor: PMI nondisplaced. Regular rate & rhythm. No rubs, gallops or murmurs. Lungs: clear Abdomen: soft, nontender, nondistended. No hepatosplenomegaly. No bruits or masses. Good bowel sounds. Extremities: no cyanosis, clubbing, rash, 1+ edema LLE and tr on RLE  Neuro: alert &  oriented x 3, cranial nerves grossly intact. moves all  4 extremities w/o difficulty. Flat Affect    ASSESSMENT & PLAN:

## 2012-04-07 NOTE — Assessment & Plan Note (Addendum)
Volume status trending back up.  Will increase lasix to 40 mg twice daily as well as potassium twice daily.  With systolic heart failure will try to wean clonidine therefore will increase bidil 2 tabs three times daily.  At follow up will try to add spironolactone.  Discussed daily weight and low sodium diet.  Patient seen and examined with Leone Haven, PA-C. We discussed all aspects of the encounter. I agree with the assessment and plan as stated above. 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 Volume status trending back up. Agree with increasing lasix. Will try to wean clonidine in favor of more cardio-friendly meds. Extensive HF education given including on daily weights and sliding scale lasix. May benefit from cardiac rehab for HF.

## 2012-04-07 NOTE — Patient Instructions (Addendum)
Increase lasix 40 mg (1 tab) twice daily  Increase bidil to 2 tabs three times daily  Decrease clonidine 0.2 mg twice daily  Follow up 2 weeks with Dr Haroldine Laws with labs

## 2012-04-07 NOTE — Assessment & Plan Note (Addendum)
As above, try to wean clonidine and increase bidil.    Attending: Agree.

## 2012-04-12 ENCOUNTER — Other Ambulatory Visit: Payer: Self-pay

## 2012-04-12 ENCOUNTER — Emergency Department (HOSPITAL_COMMUNITY): Payer: PRIVATE HEALTH INSURANCE

## 2012-04-12 ENCOUNTER — Telehealth (HOSPITAL_COMMUNITY): Payer: Self-pay | Admitting: Cardiology

## 2012-04-12 ENCOUNTER — Encounter (HOSPITAL_COMMUNITY): Payer: Self-pay | Admitting: *Deleted

## 2012-04-12 ENCOUNTER — Ambulatory Visit: Payer: PRIVATE HEALTH INSURANCE | Admitting: Physical Therapy

## 2012-04-12 ENCOUNTER — Emergency Department (HOSPITAL_COMMUNITY)
Admission: EM | Admit: 2012-04-12 | Discharge: 2012-04-12 | Disposition: A | Discharge status: Home / Self Care | Payer: PRIVATE HEALTH INSURANCE | Attending: Emergency Medicine | Admitting: Emergency Medicine

## 2012-04-12 ENCOUNTER — Ambulatory Visit: Payer: PRIVATE HEALTH INSURANCE | Admitting: Occupational Therapy

## 2012-04-12 DIAGNOSIS — Z87891 Personal history of nicotine dependence: Secondary | ICD-10-CM | POA: Insufficient documentation

## 2012-04-12 DIAGNOSIS — M549 Dorsalgia, unspecified: Secondary | ICD-10-CM | POA: Insufficient documentation

## 2012-04-12 DIAGNOSIS — I509 Heart failure, unspecified: Secondary | ICD-10-CM | POA: Insufficient documentation

## 2012-04-12 DIAGNOSIS — R109 Unspecified abdominal pain: Secondary | ICD-10-CM | POA: Insufficient documentation

## 2012-04-12 DIAGNOSIS — Z8619 Personal history of other infectious and parasitic diseases: Secondary | ICD-10-CM | POA: Insufficient documentation

## 2012-04-12 DIAGNOSIS — I10 Essential (primary) hypertension: Secondary | ICD-10-CM | POA: Insufficient documentation

## 2012-04-12 DIAGNOSIS — R079 Chest pain, unspecified: Secondary | ICD-10-CM | POA: Insufficient documentation

## 2012-04-12 DIAGNOSIS — Z8669 Personal history of other diseases of the nervous system and sense organs: Secondary | ICD-10-CM | POA: Insufficient documentation

## 2012-04-12 DIAGNOSIS — Z7982 Long term (current) use of aspirin: Secondary | ICD-10-CM | POA: Insufficient documentation

## 2012-04-12 DIAGNOSIS — J3489 Other specified disorders of nose and nasal sinuses: Secondary | ICD-10-CM | POA: Insufficient documentation

## 2012-04-12 DIAGNOSIS — R5381 Other malaise: Secondary | ICD-10-CM | POA: Insufficient documentation

## 2012-04-12 DIAGNOSIS — Z8719 Personal history of other diseases of the digestive system: Secondary | ICD-10-CM | POA: Insufficient documentation

## 2012-04-12 DIAGNOSIS — Z79899 Other long term (current) drug therapy: Secondary | ICD-10-CM | POA: Insufficient documentation

## 2012-04-12 DIAGNOSIS — Z862 Personal history of diseases of the blood and blood-forming organs and certain disorders involving the immune mechanism: Secondary | ICD-10-CM | POA: Insufficient documentation

## 2012-04-12 DIAGNOSIS — F259 Schizoaffective disorder, unspecified: Secondary | ICD-10-CM | POA: Insufficient documentation

## 2012-04-12 DIAGNOSIS — Z8673 Personal history of transient ischemic attack (TIA), and cerebral infarction without residual deficits: Secondary | ICD-10-CM | POA: Insufficient documentation

## 2012-04-12 DIAGNOSIS — M129 Arthropathy, unspecified: Secondary | ICD-10-CM | POA: Insufficient documentation

## 2012-04-12 DIAGNOSIS — M7989 Other specified soft tissue disorders: Secondary | ICD-10-CM | POA: Insufficient documentation

## 2012-04-12 DIAGNOSIS — Z8639 Personal history of other endocrine, nutritional and metabolic disease: Secondary | ICD-10-CM | POA: Insufficient documentation

## 2012-04-12 LAB — CBC WITH DIFFERENTIAL/PLATELET
Basophils Absolute: 0 10*3/uL (ref 0.0–0.1)
Basophils Relative: 1 % (ref 0–1)
Eosinophils Absolute: 0.3 10*3/uL (ref 0.0–0.7)
HCT: 36.4 % — ABNORMAL LOW (ref 39.0–52.0)
Hemoglobin: 12.4 g/dL — ABNORMAL LOW (ref 13.0–17.0)
Lymphs Abs: 1.7 10*3/uL (ref 0.7–4.0)
MCHC: 34.1 g/dL (ref 30.0–36.0)
MCV: 90.5 fL (ref 78.0–100.0)
Neutrophils Relative %: 59 % (ref 43–77)
Platelets: 183 10*3/uL (ref 150–400)
RDW: 13.7 % (ref 11.5–15.5)
WBC: 6 10*3/uL (ref 4.0–10.5)

## 2012-04-12 LAB — PRO B NATRIURETIC PEPTIDE: Pro B Natriuretic peptide (BNP): 367.7 pg/mL — ABNORMAL HIGH (ref 0–125)

## 2012-04-12 LAB — URINALYSIS, ROUTINE W REFLEX MICROSCOPIC
Bilirubin Urine: NEGATIVE
Hgb urine dipstick: NEGATIVE
Nitrite: NEGATIVE
Specific Gravity, Urine: 1.021 (ref 1.005–1.030)
pH: 6 (ref 5.0–8.0)

## 2012-04-12 LAB — COMPREHENSIVE METABOLIC PANEL
ALT: 16 U/L (ref 0–53)
Albumin: 3.7 g/dL (ref 3.5–5.2)
Alkaline Phosphatase: 81 U/L (ref 39–117)
Potassium: 3.8 mEq/L (ref 3.5–5.1)
Sodium: 139 mEq/L (ref 135–145)
Total Protein: 7.3 g/dL (ref 6.0–8.3)

## 2012-04-12 LAB — POCT I-STAT TROPONIN I: Troponin i, poc: 0.05 ng/mL (ref 0.00–0.08)

## 2012-04-12 MED ORDER — HYDROCODONE-ACETAMINOPHEN 5-325 MG PO TABS: 1.0000 | ORAL_TABLET | ORAL | Status: DC | PRN

## 2012-04-12 MED ORDER — MORPHINE SULFATE 4 MG/ML IJ SOLN
4.0000 mg | Freq: Once | INTRAMUSCULAR | Status: AC
  Administered 2012-04-12: 4 mg via INTRAVENOUS
  Filled 2012-04-12: qty 1

## 2012-04-12 NOTE — ED Notes (Signed)
Pt to ED c/o acute onset sob and R flank/back pain that increases with inspiration since 12 pm.  Recently d/c'd from [facility] for tx of stroke and congestive heart failure.  PT also c/o L LE swelling 1+ noted.  Pt is waiting for cardiac cath by Dr Zoila Shutter but is "waiting for fluid to leave".

## 2012-04-12 NOTE — ED Provider Notes (Signed)
History     CSN: [id number]  Arrival date & time 04/12/12  1634   First MD Initiated Contact with Patient 04/12/12 1647      Chief Complaint  Patient presents with  . Shortness of Breath    (Consider location/radiation/quality/duration/timing/severity/associated sxs/prior treatment) Patient is a 57 y.o. male presenting with shortness of breath. The history is provided by the patient.  Shortness of Breath Associated symptoms: abdominal pain and chest pain   Associated symptoms: no headaches, no rash and no vomiting    patient's sense of multiple complaints. He has right-sided flank and chest pain that goes down to his lower back. No dysuria. His weight is up some. No fevers. No cough. The pain is worse with movement. He also states that his left leg has been swollen some. No confusion. No cough. He has had pain worse and right side of his chest.  Past Medical History  Diagnosis Date  . H/O ETOH abuse   . Colitis   . GERD (gastroesophageal reflux disease)   . Hepatitis     HEP  C  . Cocaine abuse     HISTORY OF  . 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 Diverticulitis     H/O  . Headache   . Arthritis   . Schizophrenia, schizo-affective   . Schizo affective schizophrenia     TAKES ABILIFY  IF  NEEDED  . Cataract   . Hyperlipidemia   . Wears glasses   . Wears dentures     upper  . Hypertension   . Schizophrenia   . Hepatitis C   . HIT (heparin-induced thrombocytopenia)   . H/O: alcohol abuse   . H/O cocaine abuse   . Stroke   . CHF (congestive heart failure) EF 30-35% 02/29/2012    Past Surgical History  Procedure Laterality Date  . Back surgery      8 MTHS AGO  . Left achilles    . Tonsillectomy    . Knee arthroscopy    . I&d extremity  03/20/2011    Procedure: IRRIGATION AND DEBRIDEMENT EXTREMITY;  Surgeon: Kerin Salen, MD;  Location: [place];  Service: Orthopedics;  Laterality: Left;  I&D LEFT ACHILLIES TENDON  . Eye surgery      right  eye  . Resection distal clavical  09/17/2011    Procedure: RESECTION DISTAL CLAVICAL;  Surgeon: Nita Sells, MD;  Location: [place];  Service: Orthopedics;  Laterality: Right;  right shoulder arthroscopy with sad and open distal clavicle excision   . Back surgery      Family History  Problem Relation Age of Onset  . Heart disease Mother   . Heart disease Father   . Heart disease Sister   . CAD Mother   . CAD Father   . CAD Sister     History  Substance Use Topics  . Smoking status: Former Research scientist (life sciences)  . Smokeless tobacco: Not on file  . Alcohol Use: Yes     Comment: frequently-- 1/5th a day      Review of Systems  Constitutional: Positive for fatigue. Negative for activity change and appetite change.  HENT: Negative for neck stiffness.   Eyes: Negative for pain.  Respiratory: Positive for shortness of breath. Negative for chest tightness.   Cardiovascular: Positive for chest pain and leg swelling.  Gastrointestinal: Positive for abdominal pain. Negative for nausea, vomiting and diarrhea.  Genitourinary: Positive for flank pain.  Musculoskeletal: Positive for  back pain.  Skin: Negative for rash.  Neurological: Negative for weakness, numbness and headaches.  Psychiatric/Behavioral: Negative for behavioral problems.    Allergies  Thorazine  Home Medications   Current Outpatient Rx  Name  Route  Sig  Dispense  Refill  . ARIPiprazole (ABILIFY) 10 MG tablet   Oral   Take 10 mg by mouth daily.         Marland Kitchen aspirin 81 MG tablet   Oral   Take 1 tablet (81 mg total) by mouth daily.         . cloNIDine (CATAPRES) 0.2 MG tablet   Oral   Take 1 tablet (0.2 mg total) by mouth 2 (two) times daily.   60 tablet   1   . fenofibrate (TRICOR) 48 MG tablet   Oral   Take 48 mg by mouth daily.         . folic acid (FOLVITE) 1 MG tablet   Oral   Take 1 tablet (1 mg total) by mouth daily.   30 tablet   1   . furosemide (LASIX) 40 MG tablet    Oral   Take 1 tablet (40 mg total) by mouth daily.   30 tablet   1   . isosorbide-hydrALAZINE (BIDIL) 20-37.5 MG per tablet   Oral   Take 2 tablets by mouth 3 (three) times daily.   90 tablet   1   . lisinopril (PRINIVIL,ZESTRIL) 20 MG tablet   Oral   Take 20 mg by mouth daily.         . naproxen sodium (ANAPROX) 220 MG tablet   Oral   Take 440 mg by mouth every 6 (six) hours as needed (for pain).         . potassium chloride (K-DUR,KLOR-CON) 10 MEQ tablet   Oral   Take 1 tablet (10 mEq total) by mouth 2 (two) times daily.   30 tablet   1   . traZODone (DESYREL) 100 MG tablet   Oral   Take 1 tablet (100 mg total) by mouth at bedtime.   31 tablet   3   . traZODone (DESYREL) 100 MG tablet   Oral   Take 200 mg by mouth at bedtime.         . carvedilol (COREG) 6.25 MG tablet   Oral   Take 1 tablet (6.25 mg total) by mouth 2 (two) times daily with a meal.   60 tablet   1   . HYDROcodone-acetaminophen (NORCO) 7.5-325 MG per tablet   Oral   Take 1 tablet by mouth every 4 (four) hours as needed for pain.         Marland Kitchen HYDROcodone-acetaminophen (NORCO/VICODIN) 5-325 MG per tablet   Oral   Take 1 tablet by mouth every 4 (four) hours as needed for pain.   10 tablet   0   . traMADol (ULTRAM) 50 MG tablet   Oral   Take 50 mg by mouth every 6 (six) hours as needed for pain.           BP 179/95  Pulse 65  Temp(Src) 98.1 F (36.7 C)  Resp 29  Ht 5\' 11"  (1.803 m)  Wt 255 lb (115.667 kg)  BMI 35.58 kg/m2  SpO2 99%  Physical Exam  Nursing note and vitals reviewed. Constitutional: He is oriented to person, place, and time. He appears well-developed and well-nourished.  HENT:  Head: Normocephalic and atraumatic.  Eyes: EOM are normal. Pupils are equal, round,  and reactive to light.  Neck: Normal range of motion. Neck supple.  Cardiovascular: Normal rate, regular rhythm and normal heart sounds.   No murmur heard. Pulmonary/Chest: Effort normal and breath  sounds normal.  Abdominal: Soft. Bowel sounds are normal. He exhibits no distension and no mass. There is tenderness. There is no rebound and no guarding.  Moderate tenderness in right upper and no rash. No rebound or guarding.  Musculoskeletal: Normal range of motion. He exhibits no edema.  Neurological: He is alert and oriented to person, place, and time. No cranial nerve deficit.  Skin: Skin is warm and dry.  Psychiatric: He has a normal mood and affect.    ED Course  Procedures (including critical care time)  Labs Reviewed  CBC WITH DIFFERENTIAL - Abnormal; Notable for the following:    RBC 4.02 (*)    Hemoglobin 12.4 (*)    HCT 36.4 (*)    All other components within normal limits  COMPREHENSIVE METABOLIC PANEL - Abnormal; Notable for the following:    Glucose, Bld 103 (*)    Total Bilirubin 0.2 (*)    All other components within normal limits  LIPASE, BLOOD - Abnormal; Notable for the following:    Lipase 63 (*)    All other components within normal limits  PRO B NATRIURETIC PEPTIDE - Abnormal; Notable for the following:    Pro B Natriuretic peptide (BNP) 367.7 (*)    All other components within normal limits  URINALYSIS, ROUTINE W REFLEX MICROSCOPIC  D-DIMER, QUANTITATIVE  POCT I-STAT TROPONIN I   Dg Chest 2 View  04/12/2012  *RADIOLOGY REPORT*  Clinical Data: Short of breath.  CHF  CHEST - 2 VIEW  Comparison: 03/12/2012  Findings: Cardiac enlargement without heart failure.  Improvement in vascular congestion and small effusions since the prior study. No significant residual pleural effusion is present.  Negative for pneumonia.  IMPRESSION: Interval improvement and mild heart failure.  Lungs are now clear.   Original Report Authenticated By: Carl Best, M.D.      1. Flank pain      Date: 04/12/2012  Rate: 71  Rhythm: normal sinus rhythm  QRS Axis: normal  Intervals: normal  ST/T Wave abnormalities: normal  Conduction Disutrbances:nonspecific intraventricular  conduction delay  Narrative Interpretation: P wave morphology changed.   Old EKG Reviewed: changes noted    MDM  Patient with right flank pain. Previous cardiac recent cardiac history. EKG is overall reassuring. His CHF has improved. His urine does not show infection or blood. D-dimer is negative. His left leg his been swollen recently but with a negative d-dimer I feel less likely this is a blood clot. Patient's labs have improved from previous the patient will be discharged home to follow with his primary care Dr.      Jasper Riling. Alvino Chapel, MD 04/13/12 [phone]

## 2012-04-12 NOTE — Telephone Encounter (Signed)
Pt/ pts sister called with concerns of R side back pain and L LE edema.  weight last week 250 and weight today 255. Mild sob. Denies swelling of feet and legs, cough or swelling/tenderness in abdomen.  Pt states he is in a lot of pain and is unable to complete call. At the pts request he will head over to the ER for further evaluation. Pts sister will proceed to bring pt in.

## 2012-04-13 ENCOUNTER — Ambulatory Visit: Payer: PRIVATE HEALTH INSURANCE | Admitting: Physical Therapy

## 2012-04-13 ENCOUNTER — Ambulatory Visit: Payer: PRIVATE HEALTH INSURANCE | Admitting: Occupational Therapy

## 2012-04-14 ENCOUNTER — Encounter: Payer: Self-pay | Admitting: Internal Medicine

## 2012-04-14 ENCOUNTER — Ambulatory Visit (INDEPENDENT_AMBULATORY_CARE_PROVIDER_SITE_OTHER): Payer: PRIVATE HEALTH INSURANCE | Admitting: Internal Medicine

## 2012-04-14 VITALS — BP 126/73 | HR 75 | Temp 98.0°F | Ht 71.0 in | Wt 245.6 lb

## 2012-04-14 DIAGNOSIS — E785 Hyperlipidemia, unspecified: Secondary | ICD-10-CM

## 2012-04-14 DIAGNOSIS — Z0000 Encounter for general adult medical examination without abnormal findings: Secondary | ICD-10-CM

## 2012-04-14 DIAGNOSIS — Z23 Encounter for immunization: Secondary | ICD-10-CM

## 2012-04-14 DIAGNOSIS — Z87448 Personal history of other diseases of urinary system: Secondary | ICD-10-CM

## 2012-04-14 DIAGNOSIS — L608 Other nail disorders: Secondary | ICD-10-CM

## 2012-04-14 DIAGNOSIS — B1920 Unspecified viral hepatitis C without hepatic coma: Secondary | ICD-10-CM

## 2012-04-14 DIAGNOSIS — G8929 Other chronic pain: Secondary | ICD-10-CM

## 2012-04-14 DIAGNOSIS — Z8673 Personal history of transient ischemic attack (TIA), and cerebral infarction without residual deficits: Secondary | ICD-10-CM

## 2012-04-14 LAB — COMPLETE METABOLIC PANEL WITH GFR
Albumin: 4.3 g/dL (ref 3.5–5.2)
Alkaline Phosphatase: 77 U/L (ref 39–117)
BUN: 13 mg/dL (ref 6–23)
CO2: 23 mEq/L (ref 19–32)
Calcium: 9.4 mg/dL (ref 8.4–10.5)
Chloride: 107 mEq/L (ref 96–112)
GFR, Est African American: >89 mL/min
GFR, Est Non African American: >89 mL/min
Glucose, Bld: 96 mg/dL (ref 70–99)
Potassium: 4.2 mEq/L (ref 3.5–5.3)
Sodium: 139 mEq/L (ref 135–145)
Total Protein: 7.3 g/dL (ref 6.0–8.3)

## 2012-04-14 LAB — PSA: PSA: 5 ng/mL — ABNORMAL HIGH (ref <=4.00)

## 2012-04-14 MED ORDER — ROSUVASTATIN CALCIUM 20 MG PO TABS: 20.0000 mg | ORAL_TABLET | Freq: Every day | ORAL | Status: DC

## 2012-04-14 MED ORDER — HYDROCODONE-ACETAMINOPHEN 5-325 MG PO TABS: 1.0000 | ORAL_TABLET | ORAL | Status: DC | PRN

## 2012-04-14 MED ORDER — TETANUS-DIPHTH-ACELL PERTUSSIS 5-2.5-18.5 LF-MCG/0.5 IM SUSP: 0.5000 mL | Freq: Once | INTRAMUSCULAR | Status: DC

## 2012-04-14 NOTE — Progress Notes (Signed)
  Subjective:    Patient ID: [name] [name], male    DOB: 02/07/1955, 57 y.o.   MRN: [id number]  HPI Presents today with his sister.  He is currently seeing Dr. Simeon Craft from Cardiology.  He is currently undergoing physical/occupational therapy twice a week.  He is currently seeing an "eye doctor" (Dr. Harrison Mons) as well, but not currently on any type of medication.   He states he is feeling well.  He states he gets SOB at times when "he moves around too much", but his diuretics are being adjusted.  He is pending in a catherization as well.   Denies bleeding. Denies hematochezia, melena, hematuria. He has never had a colonoscopy.  His sister is interested in prostate cancer screening.  His brother has a history of prostate cancer.   He states he was a heavy drinker, last drink was 2 months ago (20 years/ drunk daily). He states he quit tobacco 10 years ago (17 years/ 0.5 ppd). No PND, no orthopnea.  Review of Systems Complete 12 point review of systems otherwise negative except for that stated in the HPI.    Objective:   Physical Exam Filed Vitals:   04/14/12 0852  BP: 126/73  Pulse: 75  Temp: 98 F (36.7 C)    GEN: AAOx3, NAD. HEENT: EOMI, PERRLA, no icterus, no adenopathy. CV: S1S2, distant heart sounds, no JVD or HJR, RRR. PULM: CTA bilat. ABD/GI: Soft, NT, +BS, no guarding, no distention, no palpable mass. LE/UE: 2/4 pulses, trace bilat pitting edema. Unkempt toenails. NEURO: CN II-XII intact, I do not appreciate any focal deficits.    Assessment & Plan:  17 yr. Old AAM with hx HTN, schizophrenia, hx cerebellar/left brain stem CVA, hx substance abuse including cocaine and tobacco, hx rhabdomyolysis, chronic systolic heart failure (EF 35-40%) followed at HF clinic, presents for follow up. 1) Chronic systolic HF: Euvolemic today. Follows with HF clinic. He is pending cardiac cath. 2) HTN: Controlled. 3) Schizophrenia: Stable on current regimen. 4) HL: reviewed lipid panel  from 01/2012. 10 year risk of CV event is 12.4%. Discussed statin therapy. No previous contraindication to statins or hx allergy. Start rosuvastatin 20 mg daily. 5) Substance abuse: no longer using cocaine or TOB, educated. 6) Health maintenance: Pneumovax today, Flu vaccine given 4 months ago at [facility] (his pharmacy), DTap. He is requesting PSA testing, I educated him on risks and benefits and current recommendations based on evidence. He wants to proceed using PSA testing. Referral to podiatry for unkempt toenails. Colonoscopy will be deferred until he is stable on regimen post cardiac cath.

## 2012-04-14 NOTE — Patient Instructions (Signed)
Blood tests today. Return in three months.

## 2012-04-15 LAB — PRESCRIPTION ABUSE MONITORING 15P, URINE
Amphetamine/Meth: NEGATIVE ng/mL
Cannabinoid Scrn, Ur: NEGATIVE ng/mL
Cocaine Metabolites: NEGATIVE ng/mL
Creatinine, Urine: 24.2 mg/dL (ref >20.0)
Fentanyl, Ur: NEGATIVE ng/mL
Methadone Screen, Urine: NEGATIVE ng/mL
Oxycodone Screen, Ur: NEGATIVE ng/mL
Zolpidem, Urine: NEGATIVE ng/mL

## 2012-04-18 ENCOUNTER — Other Ambulatory Visit: Payer: Self-pay | Admitting: Internal Medicine

## 2012-04-18 ENCOUNTER — Telehealth: Payer: Self-pay | Admitting: Internal Medicine

## 2012-04-18 DIAGNOSIS — R972 Elevated prostate specific antigen [PSA]: Secondary | ICD-10-CM

## 2012-04-18 LAB — OPIATES/OPIOIDS (LC/MS-MS)
Codeine Urine: NEGATIVE ng/mL
Heroin (6-AM), UR: NEGATIVE ng/mL
Hydrocodone: 50 ng/mL
Norhydrocodone, Ur: 85 ng/mL
Oxymorphone: NEGATIVE ng/mL

## 2012-04-18 LAB — BARBITURATES (GC/LC/MS), URINE
Amobarbital: NEGATIVE ng/mL
Pentabarbital: NEGATIVE ng/mL
Secobarbital: NEGATIVE ng/mL

## 2012-04-18 NOTE — Telephone Encounter (Signed)
I called and spoke with [name] regarding his PSA test results. All of his questions were answered. I will refer him to urology.

## 2012-04-19 ENCOUNTER — Ambulatory Visit: Payer: PRIVATE HEALTH INSURANCE | Admitting: Occupational Therapy

## 2012-04-19 ENCOUNTER — Ambulatory Visit: Payer: Self-pay | Admitting: Physical Therapy

## 2012-04-19 ENCOUNTER — Ambulatory Visit: Payer: PRIVATE HEALTH INSURANCE | Admitting: Physical Therapy

## 2012-04-20 ENCOUNTER — Encounter (HOSPITAL_COMMUNITY): Payer: Self-pay | Admitting: *Deleted

## 2012-04-20 ENCOUNTER — Encounter (HOSPITAL_COMMUNITY): Payer: Self-pay

## 2012-04-20 ENCOUNTER — Ambulatory Visit (HOSPITAL_COMMUNITY)
Admission: RE | Admit: 2012-04-20 | Discharge: 2012-04-20 | Disposition: A | Discharge status: Home / Self Care | Payer: PRIVATE HEALTH INSURANCE | Source: Ambulatory Visit | Attending: Internal Medicine | Admitting: Internal Medicine

## 2012-04-20 VITALS — BP 152/98 | HR 66 | Wt 251.1 lb

## 2012-04-20 DIAGNOSIS — I5022 Chronic systolic (congestive) heart failure: Secondary | ICD-10-CM | POA: Insufficient documentation

## 2012-04-20 DIAGNOSIS — I10 Essential (primary) hypertension: Secondary | ICD-10-CM | POA: Insufficient documentation

## 2012-04-20 DIAGNOSIS — I509 Heart failure, unspecified: Secondary | ICD-10-CM | POA: Insufficient documentation

## 2012-04-20 LAB — CBC
Hemoglobin: 12.5 g/dL — ABNORMAL LOW (ref 13.0–17.0)
MCH: 30 pg (ref 26.0–34.0)
MCV: 90.9 fL (ref 78.0–100.0)
Platelets: 224 10*3/uL (ref 150–400)
RBC: 4.16 MIL/uL — ABNORMAL LOW (ref 4.22–5.81)
WBC: 6.1 10*3/uL (ref 4.0–10.5)

## 2012-04-20 LAB — BASIC METABOLIC PANEL
CO2: 23 mEq/L (ref 19–32)
Calcium: 9.6 mg/dL (ref 8.4–10.5)
Chloride: 104 mEq/L (ref 96–112)
Glucose, Bld: 101 mg/dL — ABNORMAL HIGH (ref 70–99)
Potassium: 4.2 mEq/L (ref 3.5–5.1)
Sodium: 138 mEq/L (ref 135–145)

## 2012-04-20 MED ORDER — LISINOPRIL 20 MG PO TABS: ORAL_TABLET | ORAL | Status: DC

## 2012-04-20 NOTE — Assessment & Plan Note (Signed)
As above, will titrate lisinopril in hopes to wean clonidine as he does have systolic heart failure.

## 2012-04-20 NOTE — Progress Notes (Signed)
Referring Physician: Dr. Silverio Decamp Primary Care: Internal Medicine clinic, Dr. Silverio Decamp Primary Cardiologist: Dr. Doylene Canard   HPI: [name] is a 57 y.o. 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  History of substance abuse but quit smoking cigarettes 3 years ago after smoking 25 years, alcohol use but not any mor as well as history of cocaine use.  Had history of acute renal failure in setting of rhamdo requiring short term HD.  Denies DM  He was recently admitted with new onset HF.  EF 35-40%.  He was diuresed and discharged home.  Discharge weight 256 pounds. Echo at the time showed EF 123456, grade 1 diastolic dysfunction.     He returns for follow up today.  He says he is feeling pretty good.  He went to the ER a couple of weeks ago due to right sided flank pain.  Test were ran and he was released, this has since resolved.  He says his weight is stable at home, 240 pounds.  He complains of mild dyspnea with activity.  Says it is difficult to lay on his back but he is ok if he lays on his side.  No PND.  Still has occasional chest pressure.  He relates this to dyspnea.  Lives alone.     Review of Systems: All pertinent positives and negatives as in HPI, otherwise negative.    Past Medical History  Diagnosis Date  . H/O ETOH abuse   . Colitis   . GERD (gastroesophageal reflux disease)   . Hepatitis     HEP  C  . Cocaine abuse     HISTORY OF  . 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 Diverticulitis     H/O  . Headache   . Arthritis   . Schizophrenia, schizo-affective   . Schizo affective schizophrenia     TAKES ABILIFY  IF  NEEDED  . Cataract   . Hyperlipidemia   . Wears glasses   . Wears dentures     upper  . Hypertension   . Schizophrenia   . Hepatitis C   . HIT (heparin-induced thrombocytopenia)   . H/O: alcohol abuse   . H/O cocaine abuse   . Stroke   . CHF (congestive  heart failure) EF 30-35% 02/29/2012    Current Outpatient Prescriptions  Medication Sig Dispense Refill  . ARIPiprazole (ABILIFY) 10 MG tablet Take 10 mg by mouth daily.      Marland Kitchen aspirin 81 MG tablet Take 1 tablet (81 mg total) by mouth daily.      . carvedilol (COREG) 6.25 MG tablet Take 1 tablet (6.25 mg total) by mouth 2 (two) times daily with a meal.  60 tablet  1  . cloNIDine (CATAPRES) 0.2 MG tablet Take 1 tablet (0.2 mg total) by mouth 2 (two) times daily.  60 tablet  1  . folic acid (FOLVITE) 1 MG tablet Take 1 tablet (1 mg total) by mouth daily.  30 tablet  1  . furosemide (LASIX) 40 MG tablet Take 40 mg by mouth 3 (three) times daily.      Marland Kitchen HYDROcodone-acetaminophen (NORCO/VICODIN) 5-325 MG per tablet Take 1 tablet by mouth every 4 (four) hours as needed for pain.  90 tablet  2  . isosorbide-hydrALAZINE (BIDIL) 20-37.5 MG per tablet Take 2 tablets by mouth 3 (three) times daily.  90 tablet  1  . lisinopril (PRINIVIL,ZESTRIL)  20 MG tablet Take 20 mg by mouth daily.      . potassium chloride (K-DUR,KLOR-[name]) 10 MEQ tablet Take 1 tablet (10 mEq total) by mouth 2 (two) times daily.  30 tablet  1  . rosuvastatin (CRESTOR) 20 MG tablet Take 1 tablet (20 mg total) by mouth daily.  30 tablet  3  . traZODone (DESYREL) 100 MG tablet Take 1 tablet (100 mg total) by mouth at bedtime.  31 tablet  3   No current facility-administered medications for this encounter.    Allergies  Allergen Reactions  . Thorazine (Chlorpromazine Hcl) Other (See Comments)    Body freezes up    PHYSICAL EXAM: Filed Vitals:   04/20/12 1152  BP: 152/98  Pulse: 66  Weight: 251 lb 1.9 oz (113.907 kg)  SpO2: 97%    General:  Chronically ill appearing. No respiratory difficulty HEENT: normal Neck: supple. JVP 8-9.  Carotids 2+ bilat; no bruits. No lymphadenopathy or thryomegaly appreciated. Cor: PMI nondisplaced. Regular rate & rhythm. No rubs, gallops or murmurs. Lungs: clear Abdomen: soft, nontender,  nondistended. No hepatosplenomegaly. No bruits or masses. Good bowel sounds. Extremities: no cyanosis, clubbing, rash, tr edema  Neuro: alert & oriented x 3, cranial nerves grossly intact. moves all 4 extremities w/o difficulty. Flat Affect    ASSESSMENT & PLAN:

## 2012-04-20 NOTE — Assessment & Plan Note (Signed)
Volume status looks pretty good today.  NYHA III.  Will continue lasix 40 mg BID.  The patient c/o of chest pressure related to his dyspnea.  I believe the chest pressure is related to this heart failure but he has not had previous catheterization therefore will proceed with right and left heart catheterization.  Re-educated on low sodium diet and daily weights.  Will titrate lisinopril 10/20 mg to help with blood pressure and afterload reduction.

## 2012-04-20 NOTE — Patient Instructions (Addendum)
Increase lisinopril 0.5 tab (10 mg) in the morning and 1 tab (20 mg) at night  May take extra lasix for increased weight gain.  Your physician has requested that you have a cardiac catheterization. Cardiac catheterization is used to diagnose and/or treat various heart conditions. Doctors may recommend this procedure for a number of different reasons. The most common reason is to evaluate chest pain. Chest pain can be a symptom of coronary artery disease (CAD), and cardiac catheterization can show whether plaque is narrowing or blocking your heart's arteries. This procedure is also used to evaluate the valves, as well as measure the blood flow and oxygen levels in different parts of your heart. For further information please visit [email or web address]. Please follow instruction sheet, as given.

## 2012-04-22 ENCOUNTER — Ambulatory Visit: Payer: Self-pay | Admitting: Physical Therapy

## 2012-04-22 ENCOUNTER — Ambulatory Visit: Payer: PRIVATE HEALTH INSURANCE | Admitting: Occupational Therapy

## 2012-04-26 ENCOUNTER — Ambulatory Visit: Payer: Self-pay | Admitting: Physical Therapy

## 2012-04-26 ENCOUNTER — Ambulatory Visit: Payer: PRIVATE HEALTH INSURANCE | Attending: Physical Medicine & Rehabilitation | Admitting: Occupational Therapy

## 2012-04-26 DIAGNOSIS — I69998 Other sequelae following unspecified cerebrovascular disease: Secondary | ICD-10-CM | POA: Insufficient documentation

## 2012-04-26 DIAGNOSIS — Z5189 Encounter for other specified aftercare: Secondary | ICD-10-CM | POA: Insufficient documentation

## 2012-04-26 DIAGNOSIS — I69919 Unspecified symptoms and signs involving cognitive functions following unspecified cerebrovascular disease: Secondary | ICD-10-CM | POA: Insufficient documentation

## 2012-04-26 DIAGNOSIS — R269 Unspecified abnormalities of gait and mobility: Secondary | ICD-10-CM | POA: Insufficient documentation

## 2012-04-26 DIAGNOSIS — M6281 Muscle weakness (generalized): Secondary | ICD-10-CM | POA: Insufficient documentation

## 2012-04-27 ENCOUNTER — Encounter (HOSPITAL_BASED_OUTPATIENT_CLINIC_OR_DEPARTMENT_OTHER)
Admission: RE | Disposition: A | Discharge status: Home / Self Care | Payer: Self-pay | Source: Ambulatory Visit | Attending: Internal Medicine

## 2012-04-27 ENCOUNTER — Inpatient Hospital Stay (HOSPITAL_BASED_OUTPATIENT_CLINIC_OR_DEPARTMENT_OTHER)
Admission: RE | Admit: 2012-04-27 | Discharge: 2012-04-27 | Disposition: A | Discharge status: Home / Self Care | Payer: PRIVATE HEALTH INSURANCE | Source: Ambulatory Visit | Attending: Internal Medicine | Admitting: Internal Medicine

## 2012-04-27 DIAGNOSIS — I10 Essential (primary) hypertension: Secondary | ICD-10-CM | POA: Insufficient documentation

## 2012-04-27 DIAGNOSIS — I428 Other cardiomyopathies: Secondary | ICD-10-CM | POA: Insufficient documentation

## 2012-04-27 DIAGNOSIS — I509 Heart failure, unspecified: Secondary | ICD-10-CM

## 2012-04-27 DIAGNOSIS — I2510 Atherosclerotic heart disease of native coronary artery without angina pectoris: Secondary | ICD-10-CM | POA: Insufficient documentation

## 2012-04-27 LAB — POCT I-STAT 3, ART BLOOD GAS (G3+)
Acid-base deficit: 1 mmol/L (ref 0.0–2.0)
O2 Saturation: 96 %
TCO2: 23 mmol/L (ref 0–100)

## 2012-04-27 LAB — POCT I-STAT 3, VENOUS BLOOD GAS (G3P V)
Acid-base deficit: 1 mmol/L (ref 0.0–2.0)
Bicarbonate: 24.3 mEq/L — ABNORMAL HIGH (ref 20.0–24.0)
pCO2, Ven: 44.8 mmHg — ABNORMAL LOW (ref 45.0–50.0)
pH, Ven: 7.336 — ABNORMAL HIGH (ref 7.250–7.300)
pH, Ven: 7.354 — ABNORMAL HIGH (ref 7.250–7.300)
pO2, Ven: 35 mmHg (ref 30.0–45.0)

## 2012-04-27 SURGERY — JV LEFT AND RIGHT HEART CATHETERIZATION WITH CORONARY ANGIOGRAM
Anesthesia: Moderate Sedation

## 2012-04-27 MED ORDER — ASPIRIN 81 MG PO CHEW
324.0000 mg | CHEWABLE_TABLET | ORAL | Status: AC
  Administered 2012-04-27: 324 mg via ORAL

## 2012-04-27 MED ORDER — SODIUM CHLORIDE 0.9 % IV SOLN: 250.0000 mL | INTRAVENOUS | Status: DC | PRN

## 2012-04-27 MED ORDER — SODIUM CHLORIDE 0.9 % IJ SOLN: 3.0000 mL | Freq: Two times a day (BID) | INTRAMUSCULAR | Status: DC

## 2012-04-27 MED ORDER — SODIUM CHLORIDE 0.9 % IJ SOLN: 3.0000 mL | INTRAMUSCULAR | Status: DC | PRN

## 2012-04-27 MED ORDER — ACETAMINOPHEN 325 MG PO TABS: 650.0000 mg | ORAL_TABLET | ORAL | Status: DC | PRN

## 2012-04-27 MED ORDER — SODIUM CHLORIDE 0.9 % IV SOLN: INTRAVENOUS | Status: AC

## 2012-04-27 MED ORDER — ONDANSETRON HCL 4 MG/2ML IJ SOLN: 4.0000 mg | Freq: Four times a day (QID) | INTRAMUSCULAR | Status: DC | PRN

## 2012-04-27 NOTE — CV Procedure (Signed)
Cardiac Cath Procedure Note  Indication: CHF  Procedures performed:  1) Right heart cathererization 2) Selective coronary angiography 3) Left heart catheterization 4) Left ventriculogram  Description of procedure:     The risks and indication of the procedure were explained. Consent was signed and placed on the chart. An appropriate timeout was taken prior to the procedure. The right groin was prepped and draped in the routine sterile fashion and anesthetized with 1% local lidocaine.   A 5 FR arterial sheath was placed in the right femoral artery using a modified Seldinger technique. Standard catheters including a JL4, JR4 and angled pigtail were used. All catheter exchanges were made over a wire. A 7 FR venous sheath was placed in the right femoral vein using a modified Seldinger technique. A standard Swan-Ganz catheter was used for the procedure.   Complications:  None apparent  Findings:  RA = 4 RV = 45/3/4 PA = 42/9 (24) PCW = 14 Fick cardiac output/index = 5.2/2.2 PVR = 1.9 Woods SVR = 1656 FA sat = 96% PA sat = 62%, 64%  Ao Pressure: 149/84 (11) LV Pressure:  142/8/29 There was no signficant gradient across the aortic valve on pullback.  Left main: Normal  LAD: Large vessel gives of 2 diagonals. Normal  LCX: Very large vessel with 3 OMs. 20-30% plaque in midsection.   RCA: Dominant. Normal  LV-gram done in the RAO projection: Ejection fraction = 35-40% global HK  Assessment: 1. Minimal CAD 2. NICM (likely hypertensive in nature) with EF 35-40% 3. Preserved cardiac output  Plan/Discussion:  I suspect his cardiomyopathy is related to HTN and possible OSA. Will need continued titration of his ACE-I and b-blocker. Refer for sleep study.   Glori Bickers, MD 1:04 PM

## 2012-04-27 NOTE — OR Nursing (Signed)
Dr Bensimhon at bedside to discuss results and treatment plan with pt and family 

## 2012-04-27 NOTE — H&P (View-Only) (Signed)
Referring Physician: Dr. Silverio Decamp Primary Care: Internal Medicine clinic, Dr. Silverio Decamp Primary Cardiologist: Dr. Doylene Canard   HPI: [name] is a 57 y.o. 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  History of substance abuse but quit smoking cigarettes 3 years ago after smoking 25 years, alcohol use but not any mor as well as history of cocaine use.  Had history of acute renal failure in setting of rhamdo requiring short term HD.  Denies DM  He was recently admitted with new onset HF.  EF 35-40%.  He was diuresed and discharged home.  Discharge weight 256 pounds. Echo at the time showed EF 123456, grade 1 diastolic dysfunction.     He returns for follow up today.  He says he is feeling pretty good.  He went to the ER a couple of weeks ago due to right sided flank pain.  Test were ran and he was released, this has since resolved.  He says his weight is stable at home, 240 pounds.  He complains of mild dyspnea with activity.  Says it is difficult to lay on his back but he is ok if he lays on his side.  No PND.  Still has occasional chest pressure.  He relates this to dyspnea.  Lives alone.     Review of Systems: All pertinent positives and negatives as in HPI, otherwise negative.    Past Medical History  Diagnosis Date  . H/O ETOH abuse   . Colitis   . GERD (gastroesophageal reflux disease)   . Hepatitis     HEP  C  . Cocaine abuse     HISTORY OF  . 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 Diverticulitis     H/O  . Headache   . Arthritis   . Schizophrenia, schizo-affective   . Schizo affective schizophrenia     TAKES ABILIFY  IF  NEEDED  . Cataract   . Hyperlipidemia   . Wears glasses   . Wears dentures     upper  . Hypertension   . Schizophrenia   . Hepatitis C   . HIT (heparin-induced thrombocytopenia)   . H/O: alcohol abuse   . H/O cocaine abuse   . Stroke   . CHF (congestive  heart failure) EF 30-35% 02/29/2012    Current Outpatient Prescriptions  Medication Sig Dispense Refill  . ARIPiprazole (ABILIFY) 10 MG tablet Take 10 mg by mouth daily.      Marland Kitchen aspirin 81 MG tablet Take 1 tablet (81 mg total) by mouth daily.      . carvedilol (COREG) 6.25 MG tablet Take 1 tablet (6.25 mg total) by mouth 2 (two) times daily with a meal.  60 tablet  1  . cloNIDine (CATAPRES) 0.2 MG tablet Take 1 tablet (0.2 mg total) by mouth 2 (two) times daily.  60 tablet  1  . folic acid (FOLVITE) 1 MG tablet Take 1 tablet (1 mg total) by mouth daily.  30 tablet  1  . furosemide (LASIX) 40 MG tablet Take 40 mg by mouth 3 (three) times daily.      Marland Kitchen HYDROcodone-acetaminophen (NORCO/VICODIN) 5-325 MG per tablet Take 1 tablet by mouth every 4 (four) hours as needed for pain.  90 tablet  2  . isosorbide-hydrALAZINE (BIDIL) 20-37.5 MG per tablet Take 2 tablets by mouth 3 (three) times daily.  90 tablet  1  . lisinopril (PRINIVIL,ZESTRIL)  20 MG tablet Take 20 mg by mouth daily.      . potassium chloride (K-DUR,KLOR-[name]) 10 MEQ tablet Take 1 tablet (10 mEq total) by mouth 2 (two) times daily.  30 tablet  1  . rosuvastatin (CRESTOR) 20 MG tablet Take 1 tablet (20 mg total) by mouth daily.  30 tablet  3  . traZODone (DESYREL) 100 MG tablet Take 1 tablet (100 mg total) by mouth at bedtime.  31 tablet  3   No current facility-administered medications for this encounter.    Allergies  Allergen Reactions  . Thorazine (Chlorpromazine Hcl) Other (See Comments)    Body freezes up    PHYSICAL EXAM: Filed Vitals:   04/20/12 1152  BP: 152/98  Pulse: 66  Weight: 251 lb 1.9 oz (113.907 kg)  SpO2: 97%    General:  Chronically ill appearing. No respiratory difficulty HEENT: normal Neck: supple. JVP 8-9.  Carotids 2+ bilat; no bruits. No lymphadenopathy or thryomegaly appreciated. Cor: PMI nondisplaced. Regular rate & rhythm. No rubs, gallops or murmurs. Lungs: clear Abdomen: soft, nontender,  nondistended. No hepatosplenomegaly. No bruits or masses. Good bowel sounds. Extremities: no cyanosis, clubbing, rash, tr edema  Neuro: alert & oriented x 3, cranial nerves grossly intact. moves all 4 extremities w/o difficulty. Flat Affect    ASSESSMENT & PLAN:

## 2012-04-27 NOTE — OR Nursing (Signed)
Tegaderm dressing applied, site level 0, bedrest begins at 1320

## 2012-04-27 NOTE — Interval H&P Note (Signed)
History and Physical Interval Note:  04/27/2012 12:40 PM  [name] [name]  has presented today for surgery, with the diagnosis of CHF  The various methods of treatment have been discussed with the patient and family. After consideration of risks, benefits and other options for treatment, the patient has consented to  Procedure(s): JV LEFT AND RIGHT HEART CATHETERIZATION WITH CORONARY ANGIOGRAM (N/A) as a surgical intervention .  The patient's history has been reviewed, patient examined, no change in status, stable for surgery.  I have reviewed the patient's chart and labs.  Questions were answered to the patient's satisfaction.     Quinlynn Cuthbert

## 2012-04-27 NOTE — OR Nursing (Signed)
Meal served 

## 2012-04-27 NOTE — OR Nursing (Signed)
Discharge instructions reviewed and signed, pt stated understanding, ambulated in hall without difficulty, site level 0, transported to sister's car via weelchair

## 2012-04-28 ENCOUNTER — Encounter: Payer: Self-pay | Admitting: Occupational Therapy

## 2012-04-28 ENCOUNTER — Ambulatory Visit: Payer: Self-pay | Admitting: Physical Therapy

## 2012-05-03 ENCOUNTER — Encounter: Payer: Self-pay | Admitting: Internal Medicine

## 2012-05-03 ENCOUNTER — Ambulatory Visit: Payer: PRIVATE HEALTH INSURANCE | Admitting: Occupational Therapy

## 2012-05-03 DIAGNOSIS — R269 Unspecified abnormalities of gait and mobility: Secondary | ICD-10-CM | POA: Insufficient documentation

## 2012-05-04 NOTE — Addendum Note (Signed)
Addended by: Truddie Crumble on: 05/04/2012 12:45 PM   Modules accepted: Orders

## 2012-05-12 ENCOUNTER — Encounter (HOSPITAL_COMMUNITY): Payer: Self-pay | Admitting: Emergency Medicine

## 2012-05-12 ENCOUNTER — Emergency Department (HOSPITAL_COMMUNITY)
Admission: EM | Admit: 2012-05-12 | Discharge: 2012-05-13 | Disposition: A | Discharge status: Home / Self Care | Payer: PRIVATE HEALTH INSURANCE | Attending: Emergency Medicine | Admitting: Emergency Medicine

## 2012-05-12 ENCOUNTER — Ambulatory Visit (HOSPITAL_COMMUNITY): Payer: PRIVATE HEALTH INSURANCE

## 2012-05-12 ENCOUNTER — Emergency Department (HOSPITAL_COMMUNITY): Payer: PRIVATE HEALTH INSURANCE

## 2012-05-12 DIAGNOSIS — Z7982 Long term (current) use of aspirin: Secondary | ICD-10-CM | POA: Insufficient documentation

## 2012-05-12 DIAGNOSIS — Z792 Long term (current) use of antibiotics: Secondary | ICD-10-CM | POA: Insufficient documentation

## 2012-05-12 DIAGNOSIS — Z8669 Personal history of other diseases of the nervous system and sense organs: Secondary | ICD-10-CM | POA: Insufficient documentation

## 2012-05-12 DIAGNOSIS — Z8619 Personal history of other infectious and parasitic diseases: Secondary | ICD-10-CM | POA: Insufficient documentation

## 2012-05-12 DIAGNOSIS — R05 Cough: Secondary | ICD-10-CM | POA: Insufficient documentation

## 2012-05-12 DIAGNOSIS — R0601 Orthopnea: Secondary | ICD-10-CM | POA: Insufficient documentation

## 2012-05-12 DIAGNOSIS — Z87891 Personal history of nicotine dependence: Secondary | ICD-10-CM | POA: Insufficient documentation

## 2012-05-12 DIAGNOSIS — Z79899 Other long term (current) drug therapy: Secondary | ICD-10-CM | POA: Insufficient documentation

## 2012-05-12 DIAGNOSIS — R059 Cough, unspecified: Secondary | ICD-10-CM | POA: Insufficient documentation

## 2012-05-12 DIAGNOSIS — Z8739 Personal history of other diseases of the musculoskeletal system and connective tissue: Secondary | ICD-10-CM | POA: Insufficient documentation

## 2012-05-12 DIAGNOSIS — Z8673 Personal history of transient ischemic attack (TIA), and cerebral infarction without residual deficits: Secondary | ICD-10-CM | POA: Insufficient documentation

## 2012-05-12 DIAGNOSIS — R011 Cardiac murmur, unspecified: Secondary | ICD-10-CM | POA: Insufficient documentation

## 2012-05-12 DIAGNOSIS — I10 Essential (primary) hypertension: Secondary | ICD-10-CM | POA: Insufficient documentation

## 2012-05-12 DIAGNOSIS — E785 Hyperlipidemia, unspecified: Secondary | ICD-10-CM | POA: Insufficient documentation

## 2012-05-12 DIAGNOSIS — F259 Schizoaffective disorder, unspecified: Secondary | ICD-10-CM | POA: Insufficient documentation

## 2012-05-12 DIAGNOSIS — I509 Heart failure, unspecified: Secondary | ICD-10-CM | POA: Insufficient documentation

## 2012-05-12 DIAGNOSIS — Z8719 Personal history of other diseases of the digestive system: Secondary | ICD-10-CM | POA: Insufficient documentation

## 2012-05-12 DIAGNOSIS — Z862 Personal history of diseases of the blood and blood-forming organs and certain disorders involving the immune mechanism: Secondary | ICD-10-CM | POA: Insufficient documentation

## 2012-05-12 LAB — COMPREHENSIVE METABOLIC PANEL
BUN: 13 mg/dL (ref 6–23)
Calcium: 9.6 mg/dL (ref 8.4–10.5)
Creatinine, Ser: 0.95 mg/dL (ref 0.50–1.35)
GFR calc Af Amer: >90 mL/min (ref >90)
Glucose, Bld: 83 mg/dL (ref 70–99)
Total Protein: 8 g/dL (ref 6.0–8.3)

## 2012-05-12 LAB — CBC WITH DIFFERENTIAL/PLATELET
Eosinophils Absolute: 0.1 10*3/uL (ref 0.0–0.7)
Eosinophils Relative: 2 % (ref 0–5)
Hemoglobin: 12.7 g/dL — ABNORMAL LOW (ref 13.0–17.0)
Lymphs Abs: 1.7 10*3/uL (ref 0.7–4.0)
MCH: 30.1 pg (ref 26.0–34.0)
MCV: 87.4 fL (ref 78.0–100.0)
Monocytes Relative: 6 % (ref 3–12)
Platelets: 214 10*3/uL (ref 150–400)
RBC: 4.22 MIL/uL (ref 4.22–5.81)

## 2012-05-12 NOTE — ED Notes (Signed)
PT. ARRIVED WITH EMS FROM HOME REPORTS SOB WITH PRODUCTIVE COUGH FOR SEVERAL DAYS , ETOH THIS EVENING , DENIES FEVER OR CHILLS.

## 2012-05-13 LAB — POCT I-STAT TROPONIN I
Troponin i, poc: 0.04 ng/mL (ref 0.00–0.08)
Troponin i, poc: 0.05 ng/mL (ref 0.00–0.08)

## 2012-05-13 MED ORDER — FUROSEMIDE 10 MG/ML IJ SOLN
40.0000 mg | Freq: Once | INTRAMUSCULAR | Status: AC
  Administered 2012-05-13: 40 mg via INTRAVENOUS
  Filled 2012-05-13: qty 4

## 2012-05-13 NOTE — ED Notes (Addendum)
Pt. At 100% of breakfast

## 2012-05-13 NOTE — ED Notes (Signed)
md in with pt to discuss disposition.

## 2012-05-13 NOTE — ED Provider Notes (Signed)
History     CSN: [id number]  Arrival date & time 05/12/12  2206   First MD Initiated Contact with Patient 05/13/12 0031      Chief Complaint  Patient presents with  . Shortness of Breath    (Consider location/radiation/quality/duration/timing/severity/associated sxs/prior treatment) HPI Comments: 57 y.o. 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 Pt states that for the past couple of days, he has been having some exertional dib, dry cough, and now orthopnea and PND. He has been taking his meds as prescribed. No n/v/f/c. No leg swelling, no hx of PE, DVT. No chest pains. Pt unsure if he has any weight gain.  Patient is a 57 y.o. male presenting with shortness of breath. The history is provided by the patient.  Shortness of Breath Associated symptoms: cough   Associated symptoms: no chest pain, no fever, no headaches and no neck pain     Past Medical History  Diagnosis Date  . H/O ETOH abuse   . Colitis   . GERD (gastroesophageal reflux disease)   . Hepatitis     HEP  C  . Cocaine abuse     HISTORY OF  . 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 Diverticulitis     H/O  . Headache   . Arthritis   . Schizophrenia, schizo-affective   . Schizo affective schizophrenia     TAKES ABILIFY  IF  NEEDED  . Cataract   . Hyperlipidemia   . Wears glasses   . Wears dentures     upper  . Hypertension   . Schizophrenia   . Hepatitis C   . HIT (heparin-induced thrombocytopenia)   . H/O: alcohol abuse   . H/O cocaine abuse   . Stroke   . CHF (congestive heart failure) EF 30-35% 02/29/2012    Past Surgical History  Procedure Laterality Date  . Back surgery      8 MTHS AGO  . Left achilles    . Tonsillectomy    . Knee arthroscopy    . I&d extremity  03/20/2011   Procedure: IRRIGATION AND DEBRIDEMENT EXTREMITY;  Surgeon: Kerin Salen, MD;  Location: [facility];  Service: Orthopedics;  Laterality: Left;  I&D LEFT ACHILLIES TENDON  . Eye surgery      right eye  . Resection distal clavical  09/17/2011    Procedure: RESECTION DISTAL CLAVICAL;  Surgeon: Nita Sells, MD;  Location: [facility];  Service: Orthopedics;  Laterality: Right;  right shoulder arthroscopy with sad and open distal clavicle excision   . Back surgery      Family History  Problem Relation Age of Onset  . Heart disease Mother   . Heart disease Father   . Heart disease Sister   . CAD Mother   . CAD Father   . CAD Sister     History  Substance Use Topics  . Smoking status: Former Research scientist (life sciences)  . Smokeless tobacco: Not on file  . Alcohol Use: Yes     Comment: frequently-- 1/5th a day      Review of Systems  Constitutional: Negative for fever, chills and activity change.  HENT: Negative for neck pain.   Eyes: Negative for visual disturbance.  Respiratory: Positive for  cough and shortness of breath. Negative for chest tightness.   Cardiovascular: Negative for chest pain.  Gastrointestinal: Negative for abdominal distention.  Genitourinary: Negative for dysuria, enuresis and difficulty urinating.  Musculoskeletal: Negative for arthralgias.  Neurological: Negative for dizziness, light-headedness and headaches.  Psychiatric/Behavioral: Negative for confusion.    Allergies  Thorazine  Home Medications   Current Outpatient Rx  Name  Route  Sig  Dispense  Refill  . ARIPiprazole (ABILIFY) 10 MG tablet   Oral   Take 10 mg by mouth daily.         Marland Kitchen aspirin 81 MG tablet   Oral   Take 1 tablet (81 mg total) by mouth daily.         . carvedilol (COREG) 6.25 MG tablet   Oral   Take 1 tablet (6.25 mg total) by mouth 2 (two) times daily with a meal.   60 tablet   1   . cephALEXin (KEFLEX) 500 MG capsule   Oral   Take 500 mg by mouth 2 (two) times  daily.          . cloNIDine (CATAPRES) 0.2 MG tablet   Oral   Take 1 tablet (0.2 mg total) by mouth 2 (two) times daily.   60 tablet   1   . folic acid (FOLVITE) 1 MG tablet   Oral   Take 1 tablet (1 mg total) by mouth daily.   30 tablet   1   . furosemide (LASIX) 40 MG tablet   Oral   Take 40 mg by mouth 3 (three) times daily.         Marland Kitchen HYDROcodone-acetaminophen (NORCO/VICODIN) 5-325 MG per tablet   Oral   Take 1 tablet by mouth every 4 (four) hours as needed for pain.   90 tablet   2   . isosorbide-hydrALAZINE (BIDIL) 20-37.5 MG per tablet   Oral   Take 2 tablets by mouth 3 (three) times daily.   90 tablet   1   . ketoconazole (NIZORAL) 2 % cream   Topical   Apply 1 application topically daily.         Marland Kitchen labetalol (NORMODYNE) 100 MG tablet   Oral   Take 100 mg by mouth 2 (two) times daily.         Marland Kitchen levofloxacin (LEVAQUIN) 500 MG tablet   Oral   Take 500 mg by mouth daily.         Marland Kitchen lisinopril (PRINIVIL,ZESTRIL) 20 MG tablet   Oral   Take 10-20 mg by mouth 2 (two) times daily. Take 20mg  in AM and 10mg  in PM         . potassium chloride (K-DUR,KLOR-CON) 10 MEQ tablet   Oral   Take 1 tablet (10 mEq total) by mouth 2 (two) times daily.   30 tablet   1   . rosuvastatin (CRESTOR) 20 MG tablet   Oral   Take 1 tablet (20 mg total) by mouth daily.   30 tablet   3   . terbinafine (LAMISIL) 250 MG tablet   Oral   Take 250 mg by mouth daily.         . traZODone (DESYREL) 100 MG tablet   Oral   Take 1 tablet (100 mg total) by mouth at bedtime.   31 tablet   3     BP 147/66  Pulse 87  Temp(Src) 98.3 F (36.8 C)  Resp 16  SpO2 93%  Physical Exam  Nursing note  and vitals reviewed. Constitutional: He is oriented to person, place, and time. He appears well-developed.  HENT:  Head: Normocephalic and atraumatic.  Eyes: Conjunctivae and EOM are normal. Pupils are equal, round, and reactive to light.  Neck: Normal range of motion. Neck  supple.  Cardiovascular: Normal rate and regular rhythm.   Murmur heard. Pulmonary/Chest: Effort normal and breath sounds normal. No respiratory distress. He has no wheezes. He has no rales. He exhibits no tenderness.  Abdominal: Soft. Bowel sounds are normal. He exhibits no distension. There is no tenderness. There is no rebound and no guarding.  Musculoskeletal: He exhibits no edema.  Neurological: He is alert and oriented to person, place, and time.  Skin: Skin is warm.    ED Course  Procedures (including critical care time)  Labs Reviewed  PRO B NATRIURETIC PEPTIDE - Abnormal; Notable for the following:    Pro B Natriuretic peptide (BNP) 641.0 (*)    All other components within normal limits  CBC WITH DIFFERENTIAL - Abnormal; Notable for the following:    Hemoglobin 12.7 (*)    HCT 36.9 (*)    All other components within normal limits  COMPREHENSIVE METABOLIC PANEL  POCT I-STAT TROPONIN I   Dg Chest 2 View  05/12/2012  *RADIOLOGY REPORT*  Clinical Data: Shortness of breath and productive cough.  CHEST - 2 VIEW  Comparison: 04/12/2012  Findings: There is chronic cardiomegaly.  There is a new slight pulmonary vascular congestion.  No effusions.  No acute osseous abnormality.  IMPRESSION: New pulmonary vascular congestion.  Chronic cardiomegaly.   Original Report Authenticated By: Lorriane Shire, M.D.      No diagnosis found.    MDM  Differential diagnosis includes: ACS syndrome CHF exacerbation Valvular disorder Pericardial effusion Pneumonia Pleural effusion Pulmonary edema PE Anemia Musculoskeletal pain  Pt comes in with cc of DIB. Pt has orthopnea, PND. He has CHF with EF of 30% - and the meds are still being titrated. Exam is benign. Xray shows congestion. BNP is not markedly elevated. Troponin negative.  Requested [facility] to get patient a closer follow up, so that they can possibly uptitrate his meds, and prevent an admission. IV lasix 40 mg x 2 given  here. Ambulatory pulsox will be checked before discharge.    Date: 05/13/2012  Rate: 72  Rhythm: normal sinus rhythm  QRS Axis: normal  Intervals: normal  ST/T Wave abnormalities: normal  Conduction Disutrbances: none  Narrative Interpretation: unremarkable        Varney Biles, MD 05/13/12 [phone]

## 2012-05-13 NOTE — ED Notes (Signed)
Report to lori,rn

## 2012-05-17 ENCOUNTER — Emergency Department (HOSPITAL_COMMUNITY)
Admission: EM | Admit: 2012-05-17 | Discharge: 2012-05-17 | Disposition: A | Discharge status: Home / Self Care | Payer: PRIVATE HEALTH INSURANCE | Attending: Emergency Medicine | Admitting: Emergency Medicine

## 2012-05-17 ENCOUNTER — Emergency Department (HOSPITAL_COMMUNITY): Payer: PRIVATE HEALTH INSURANCE

## 2012-05-17 ENCOUNTER — Other Ambulatory Visit: Payer: Self-pay

## 2012-05-17 ENCOUNTER — Encounter (HOSPITAL_COMMUNITY): Payer: Self-pay | Admitting: *Deleted

## 2012-05-17 DIAGNOSIS — Z7982 Long term (current) use of aspirin: Secondary | ICD-10-CM | POA: Insufficient documentation

## 2012-05-17 DIAGNOSIS — Z8619 Personal history of other infectious and parasitic diseases: Secondary | ICD-10-CM | POA: Insufficient documentation

## 2012-05-17 DIAGNOSIS — Z79899 Other long term (current) drug therapy: Secondary | ICD-10-CM | POA: Insufficient documentation

## 2012-05-17 DIAGNOSIS — R0609 Other forms of dyspnea: Secondary | ICD-10-CM | POA: Insufficient documentation

## 2012-05-17 DIAGNOSIS — Z8669 Personal history of other diseases of the nervous system and sense organs: Secondary | ICD-10-CM | POA: Insufficient documentation

## 2012-05-17 DIAGNOSIS — Z8719 Personal history of other diseases of the digestive system: Secondary | ICD-10-CM | POA: Insufficient documentation

## 2012-05-17 DIAGNOSIS — I10 Essential (primary) hypertension: Secondary | ICD-10-CM | POA: Insufficient documentation

## 2012-05-17 DIAGNOSIS — F259 Schizoaffective disorder, unspecified: Secondary | ICD-10-CM | POA: Insufficient documentation

## 2012-05-17 DIAGNOSIS — Z98811 Dental restoration status: Secondary | ICD-10-CM | POA: Insufficient documentation

## 2012-05-17 DIAGNOSIS — F1410 Cocaine abuse, uncomplicated: Secondary | ICD-10-CM | POA: Insufficient documentation

## 2012-05-17 DIAGNOSIS — R0600 Dyspnea, unspecified: Secondary | ICD-10-CM

## 2012-05-17 DIAGNOSIS — Z87891 Personal history of nicotine dependence: Secondary | ICD-10-CM | POA: Insufficient documentation

## 2012-05-17 DIAGNOSIS — R0989 Other specified symptoms and signs involving the circulatory and respiratory systems: Secondary | ICD-10-CM | POA: Insufficient documentation

## 2012-05-17 DIAGNOSIS — Z8673 Personal history of transient ischemic attack (TIA), and cerebral infarction without residual deficits: Secondary | ICD-10-CM | POA: Insufficient documentation

## 2012-05-17 DIAGNOSIS — I509 Heart failure, unspecified: Secondary | ICD-10-CM | POA: Insufficient documentation

## 2012-05-17 DIAGNOSIS — Z862 Personal history of diseases of the blood and blood-forming organs and certain disorders involving the immune mechanism: Secondary | ICD-10-CM | POA: Insufficient documentation

## 2012-05-17 DIAGNOSIS — Z8739 Personal history of other diseases of the musculoskeletal system and connective tissue: Secondary | ICD-10-CM | POA: Insufficient documentation

## 2012-05-17 DIAGNOSIS — E785 Hyperlipidemia, unspecified: Secondary | ICD-10-CM | POA: Insufficient documentation

## 2012-05-17 LAB — COMPREHENSIVE METABOLIC PANEL
Alkaline Phosphatase: 83 U/L (ref 39–117)
BUN: 10 mg/dL (ref 6–23)
Creatinine, Ser: 0.83 mg/dL (ref 0.50–1.35)
GFR calc Af Amer: >90 mL/min (ref >90)
Glucose, Bld: 108 mg/dL — ABNORMAL HIGH (ref 70–99)
Potassium: 4.3 mEq/L (ref 3.5–5.1)
Total Bilirubin: 0.3 mg/dL (ref 0.3–1.2)
Total Protein: 7.3 g/dL (ref 6.0–8.3)

## 2012-05-17 LAB — CBC WITH DIFFERENTIAL/PLATELET
Eosinophils Absolute: 0.2 10*3/uL (ref 0.0–0.7)
HCT: 35.6 % — ABNORMAL LOW (ref 39.0–52.0)
Hemoglobin: 11.9 g/dL — ABNORMAL LOW (ref 13.0–17.0)
Lymphs Abs: 1.4 10*3/uL (ref 0.7–4.0)
MCH: 29.4 pg (ref 26.0–34.0)
MCV: 87.9 fL (ref 78.0–100.0)
Monocytes Absolute: 0.6 10*3/uL (ref 0.1–1.0)
Monocytes Relative: 11 % (ref 3–12)
Neutrophils Relative %: 59 % (ref 43–77)
RBC: 4.05 MIL/uL — ABNORMAL LOW (ref 4.22–5.81)

## 2012-05-17 LAB — TROPONIN I: Troponin I: <0.3 ng/mL (ref <0.30)

## 2012-05-17 MED ORDER — ACETAMINOPHEN 500 MG PO TABS
1000.0000 mg | ORAL_TABLET | Freq: Once | ORAL | Status: AC
  Administered 2012-05-17: 1000 mg via ORAL
  Filled 2012-05-17: qty 2

## 2012-05-17 MED ORDER — IBUPROFEN 800 MG PO TABS
800.0000 mg | ORAL_TABLET | Freq: Once | ORAL | Status: AC
  Administered 2012-05-17: 800 mg via ORAL
  Filled 2012-05-17: qty 1

## 2012-05-17 NOTE — ED Notes (Signed)
Dr Cook at bedside

## 2012-05-17 NOTE — ED Notes (Signed)
Pt reports shortness of breath x 2 days. States unable to lay flat, hx of heart failure. Pt reports headache. Mild swelling in left leg.

## 2012-05-17 NOTE — ED Notes (Signed)
Pt sitting in chair due to feeling he can't get a good breath while laying down. Pt made comfortable. Meds given for HA

## 2012-05-17 NOTE — ED Notes (Signed)
Pt alert and mentating appropriately. Pt given d/c teaching and follow up care instructions. Pt verbalizes understanding of d/c teaching and has no further questions upon d/c. NAD noted upon d/c. Pt ambulatory and states "I am catching the bus home."

## 2012-05-17 NOTE — ED Provider Notes (Signed)
History     CSN: [id number]  Arrival date & time 05/17/12  [id number]   First MD Initiated Contact with Patient 05/17/12 [phone]      Chief Complaint  Patient presents with  . Shortness of Breath    (Consider location/radiation/quality/duration/timing/severity/associated sxs/prior treatment) HPI....shortness of breath for 2 days, worse while lying flat.  No chest pain, extremity pain or swelling, history of prolonged immobilization or travel.  Patient has a multitude of health problems well-documented in his past medical history.  No fever, chills, weakness, neuro deficits, cough.  Past Medical History  Diagnosis Date  . H/O ETOH abuse   . Colitis   . GERD (gastroesophageal reflux disease)   . Hepatitis     HEP  C  . Cocaine abuse     HISTORY OF  . 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 Diverticulitis     H/O  . Headache   . Arthritis   . Schizophrenia, schizo-affective   . Schizo affective schizophrenia     TAKES ABILIFY  IF  NEEDED  . Cataract   . Hyperlipidemia   . Wears glasses   . Wears dentures     upper  . Hypertension   . Schizophrenia   . Hepatitis C   . HIT (heparin-induced thrombocytopenia)   . H/O: alcohol abuse   . H/O cocaine abuse   . Stroke   . CHF (congestive heart failure) EF 30-35% 02/29/2012    Past Surgical History  Procedure Laterality Date  . Back surgery      8 MTHS AGO  . Left achilles    . Tonsillectomy    . Knee arthroscopy    . I&d extremity  03/20/2011    Procedure: IRRIGATION AND DEBRIDEMENT EXTREMITY;  Surgeon: Kerin Salen, MD;  Location: [facility];  Service: Orthopedics;  Laterality: Left;  I&D LEFT ACHILLIES TENDON  . Eye surgery      right eye  . Resection distal clavical  09/17/2011    Procedure: RESECTION DISTAL CLAVICAL;  Surgeon: Nita Sells, MD;  Location: [facility];  Service: Orthopedics;  Laterality: Right;  right shoulder arthroscopy with sad and open distal clavicle excision   .  Back surgery      Family History  Problem Relation Age of Onset  . Heart disease Mother   . Heart disease Father   . Heart disease Sister   . CAD Mother   . CAD Father   . CAD Sister     History  Substance Use Topics  . Smoking status: Former Research scientist (life sciences)  . Smokeless tobacco: Not on file  . Alcohol Use: Yes     Comment: frequently-- 1/5th a day      Review of Systems  All other systems reviewed and are negative.    Allergies  Thorazine  Home Medications   Current Outpatient Rx  Name  Route  Sig  Dispense  Refill  . ARIPiprazole (ABILIFY) 10 MG tablet   Oral   Take 10 mg by mouth daily.         Marland Kitchen aspirin 81 MG tablet   Oral   Take 81 mg by mouth daily.         . carvedilol (COREG) 6.25 MG tablet   Oral   Take 6.25 mg by mouth 2 (two) times daily with a meal.         . cephALEXin (KEFLEX) 500 MG capsule   Oral   Take 500  mg by mouth 2 (two) times daily.          . cloNIDine (CATAPRES) 0.2 MG tablet   Oral   Take 1 tablet (0.2 mg total) by mouth 2 (two) times daily.   60 tablet   1   . folic acid (FOLVITE) 1 MG tablet   Oral   Take 1 tablet (1 mg total) by mouth daily.   30 tablet   1   . furosemide (LASIX) 40 MG tablet   Oral   Take 40 mg by mouth 3 (three) times daily.         Marland Kitchen HYDROcodone-acetaminophen (NORCO/VICODIN) 5-325 MG per tablet   Oral   Take 1 tablet by mouth every 4 (four) hours as needed for pain.   90 tablet   2   . isosorbide-hydrALAZINE (BIDIL) 20-37.5 MG per tablet   Oral   Take 2 tablets by mouth 3 (three) times daily.   90 tablet   1   . ketoconazole (NIZORAL) 2 % cream   Topical   Apply 1 application topically daily.         Marland Kitchen labetalol (NORMODYNE) 100 MG tablet   Oral   Take 100 mg by mouth 2 (two) times daily.         Marland Kitchen lisinopril (PRINIVIL,ZESTRIL) 20 MG tablet   Oral   Take 10-20 mg by mouth 2 (two) times daily. Take 20mg  in AM and 10mg  in PM         . potassium chloride (K-DUR,KLOR-CON) 10  MEQ tablet   Oral   Take 1 tablet (10 mEq total) by mouth 2 (two) times daily.   30 tablet   1   . rosuvastatin (CRESTOR) 20 MG tablet   Oral   Take 1 tablet (20 mg total) by mouth daily.   30 tablet   3   . terbinafine (LAMISIL) 250 MG tablet   Oral   Take 250 mg by mouth daily.         . traZODone (DESYREL) 100 MG tablet   Oral   Take 100 mg by mouth at bedtime.         Marland Kitchen levofloxacin (LEVAQUIN) 500 MG tablet   Oral   Take 500 mg by mouth daily.           BP 143/82  Pulse 59  Temp(Src) 98.2 F (36.8 C) (Oral)  Resp 19  Ht 5\' 11"  (1.803 m)  Wt 245 lb (111.131 kg)  BMI 34.19 kg/m2  SpO2 99%  Physical Exam  Nursing note and vitals reviewed. Constitutional: He is oriented to person, place, and time. He appears well-developed and well-nourished.  HENT:  Head: Normocephalic and atraumatic.  Eyes: Conjunctivae and EOM are normal. Pupils are equal, round, and reactive to light.  Neck: Normal range of motion. Neck supple.  Cardiovascular: Normal rate, regular rhythm and normal heart sounds.   Pulmonary/Chest: Effort normal and breath sounds normal.  Abdominal: Soft. Bowel sounds are normal.  Musculoskeletal: Normal range of motion.  Neurological: He is alert and oriented to person, place, and time.  Skin: Skin is warm and dry.  No posterior calf or thigh tenderness  Psychiatric: He has a normal mood and affect.    ED Course  Procedures (including critical care time)  Labs Reviewed  CBC WITH DIFFERENTIAL - Abnormal; Notable for the following:    RBC 4.05 (*)    Hemoglobin 11.9 (*)    HCT 35.6 (*)    All other  components within normal limits  COMPREHENSIVE METABOLIC PANEL - Abnormal; Notable for the following:    Glucose, Bld 108 (*)    All other components within normal limits  TROPONIN I   Dg Chest 2 View  05/17/2012  *RADIOLOGY REPORT*  Clinical Data: Shortness of breath, history CHF, hypertension, hepatitis C, GERD  CHEST - 2 VIEW  Comparison:  05/12/2012  Findings: Enlargement of cardiac silhouette with pulmonary vascular congestion. Tortuous aorta. Lungs clear. No pleural effusion or pneumothorax. Multilevel endplate spur formation thoracic spine.  IMPRESSION: Enlargement of cardiac silhouette with pulmonary vascular congestion. No acute infiltrate.   Original Report Authenticated By: Lavonia Dana, M.D.      1. Dyspnea       MDM  Patient has a history of congestive heart failure with an ejection fraction of 30-35%.   He does not appear ill or dyspneic. Screening tests including vital signs were normal. He has primary care followup.        Nat Christen, MD 05/17/12 1159

## 2012-05-26 ENCOUNTER — Ambulatory Visit (HOSPITAL_COMMUNITY)
Admission: RE | Admit: 2012-05-26 | Discharge: 2012-05-26 | Disposition: A | Discharge status: Home / Self Care | Payer: PRIVATE HEALTH INSURANCE | Source: Ambulatory Visit | Attending: Internal Medicine | Admitting: Internal Medicine

## 2012-05-26 ENCOUNTER — Encounter (HOSPITAL_COMMUNITY): Payer: Self-pay

## 2012-05-26 VITALS — BP 130/70 | HR 75 | Wt 248.1 lb

## 2012-05-26 DIAGNOSIS — L608 Other nail disorders: Secondary | ICD-10-CM

## 2012-05-26 DIAGNOSIS — G4700 Insomnia, unspecified: Secondary | ICD-10-CM

## 2012-05-26 DIAGNOSIS — Z8673 Personal history of transient ischemic attack (TIA), and cerebral infarction without residual deficits: Secondary | ICD-10-CM | POA: Insufficient documentation

## 2012-05-26 DIAGNOSIS — E785 Hyperlipidemia, unspecified: Secondary | ICD-10-CM | POA: Insufficient documentation

## 2012-05-26 DIAGNOSIS — M549 Dorsalgia, unspecified: Secondary | ICD-10-CM

## 2012-05-26 DIAGNOSIS — K219 Gastro-esophageal reflux disease without esophagitis: Secondary | ICD-10-CM | POA: Insufficient documentation

## 2012-05-26 DIAGNOSIS — Z0000 Encounter for general adult medical examination without abnormal findings: Secondary | ICD-10-CM

## 2012-05-26 DIAGNOSIS — Z23 Encounter for immunization: Secondary | ICD-10-CM

## 2012-05-26 DIAGNOSIS — I5022 Chronic systolic (congestive) heart failure: Secondary | ICD-10-CM

## 2012-05-26 DIAGNOSIS — Z87448 Personal history of other diseases of urinary system: Secondary | ICD-10-CM

## 2012-05-26 DIAGNOSIS — F209 Schizophrenia, unspecified: Secondary | ICD-10-CM | POA: Insufficient documentation

## 2012-05-26 DIAGNOSIS — I10 Essential (primary) hypertension: Secondary | ICD-10-CM | POA: Insufficient documentation

## 2012-05-26 DIAGNOSIS — I509 Heart failure, unspecified: Secondary | ICD-10-CM | POA: Insufficient documentation

## 2012-05-26 DIAGNOSIS — B1920 Unspecified viral hepatitis C without hepatic coma: Secondary | ICD-10-CM

## 2012-05-26 MED ORDER — FUROSEMIDE 40 MG PO TABS: 80.0000 mg | ORAL_TABLET | Freq: Two times a day (BID) | ORAL | Status: DC

## 2012-05-26 NOTE — Patient Instructions (Addendum)
Change lasix (furosemide) to 80 mg (2 tabs) in the am and (2 tabs) pm  Take superpill today (metolazone) and then as needed of weight up.  Set up sleep study.  Follow up in 2 weeks.  Do the following things EVERYDAY: 1) Weigh yourself in the morning before breakfast. Write it down and keep it in a log. 2) Take your medicines as prescribed 3) Eat low salt foods-Limit salt (sodium) to 2000 mg per day.  4) Stay as active as you can everyday 5) Limit all fluids for the day to less than 2 liters

## 2012-05-26 NOTE — Progress Notes (Signed)
Patient ID: [name] [name], male   DOB: [birth year], 57 y.o.   MRN: [id number] Referring Physician: Dr. Silverio Decamp Primary Care: Internal Medicine clinic, Dr. Silverio Decamp Primary Cardiologist: Dr. Doylene Canard   HPI: [name] is a 57 y.o. 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  History of substance abuse but quit smoking cigarettes 3 years ago after smoking 25 years, alcohol use but not any more as well as history of cocaine use.  Had history of acute renal failure in setting of rhabdo requiring short term HD.  Denies DM  He was recently admitted with new onset HF.  EF 35-40%.  He was diuresed and discharged home.  Discharge weight 256 pounds. Echo at the time showed EF 123456, grade 1 diastolic dysfunction.    Cath 04/27/12 [facility] RA = 4  RV = 45/3/4  PA = 42/9 (24)  PCW = 14  Fick cardiac output/index = 5.2/2.2  PVR = 1.9 [place]  SVR = 1656  FA sat = 96%  PA sat = 62%, 64%  Ao Pressure: 149/84 (11)  LV Pressure: 142/8/29  There was no signficant gradient across the aortic valve on pullback.  Arteries normal. LV gram EF= 35-40% global HK   Follow up Titrated lisinopril since last visit 20/10 denies dizziness. Has had two visits to ER with SOB, lasix IV given with good result. Weight at home 245-250. Reports taking medications as prescribed. +orthopnea, PND, and LE edema. Denies CP. Denies drinking more than 2L a day and watching salt intake.   Review of Systems: All pertinent positives and negatives as in HPI, otherwise negative.    Past Medical History  Diagnosis Date  . H/O ETOH abuse   . Colitis   . GERD (gastroesophageal reflux disease)   . Hepatitis     HEP  C  . Cocaine abuse     HISTORY OF  . 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 Diverticulitis     H/O  . Headache   . Arthritis   . Schizophrenia, schizo-affective   . Schizo affective schizophrenia     TAKES ABILIFY  IF   NEEDED  . Cataract   . Hyperlipidemia   . Wears glasses   . Wears dentures     upper  . Hypertension   . Schizophrenia   . Hepatitis C   . HIT (heparin-induced thrombocytopenia)   . H/O: alcohol abuse   . H/O cocaine abuse   . Stroke   . CHF (congestive heart failure) EF 30-35% 02/29/2012    Current Outpatient Prescriptions  Medication Sig Dispense Refill  . ARIPiprazole (ABILIFY) 10 MG tablet Take 10 mg by mouth daily.      Marland Kitchen aspirin 81 MG tablet Take 81 mg by mouth daily.      . carvedilol (COREG) 6.25 MG tablet Take 6.25 mg by mouth 2 (two) times daily with a meal.      . cephALEXin (KEFLEX) 500 MG capsule Take 500 mg by mouth 2 (two) times daily.       . cloNIDine (CATAPRES) 0.2 MG tablet Take 1 tablet (0.2 mg total) by mouth 2 (two) times daily.  60 tablet  1  . folic acid (FOLVITE) 1 MG tablet Take 1 tablet (1 mg total) by mouth daily.  30 tablet  1  . furosemide (LASIX) 40 MG tablet Take 40 mg by mouth 3 (three) times daily.      Marland Kitchen  HYDROcodone-acetaminophen (NORCO/VICODIN) 5-325 MG per tablet Take 1 tablet by mouth every 4 (four) hours as needed for pain.  90 tablet  2  . isosorbide-hydrALAZINE (BIDIL) 20-37.5 MG per tablet Take 2 tablets by mouth 3 (three) times daily.  90 tablet  1  . ketoconazole (NIZORAL) 2 % cream Apply 1 application topically daily.      Marland Kitchen labetalol (NORMODYNE) 100 MG tablet Take 100 mg by mouth 2 (two) times daily.      Marland Kitchen levofloxacin (LEVAQUIN) 500 MG tablet Take 500 mg by mouth daily.      Marland Kitchen lisinopril (PRINIVIL,ZESTRIL) 20 MG tablet Take 10-20 mg by mouth 2 (two) times daily. Take 20mg  in AM and 10mg  in PM      . potassium chloride (K-DUR,KLOR-CON) 10 MEQ tablet Take 1 tablet (10 mEq total) by mouth 2 (two) times daily.  30 tablet  1  . rosuvastatin (CRESTOR) 20 MG tablet Take 1 tablet (20 mg total) by mouth daily.  30 tablet  3  . terbinafine (LAMISIL) 250 MG tablet Take 250 mg by mouth daily.      . traZODone (DESYREL) 100 MG tablet Take 100 mg by  mouth at bedtime.       No current facility-administered medications for this encounter.    Allergies  Allergen Reactions  . Thorazine (Chlorpromazine Hcl) Other (See Comments)    Body freezes up    PHYSICAL EXAM: Filed Vitals:   05/26/12 1124  BP: 130/70  Pulse: 75  Weight: 248 lb 1.9 oz (112.546 kg)  SpO2: 97%    General:  Chronically ill appearing. No respiratory difficulty HEENT: normal Neck: supple. JVP 7-8.  Carotids 2+ bilat; no bruits. No lymphadenopathy or thryomegaly appreciated. Cor: PMI nondisplaced. Regular rate & rhythm. No rubs, gallops or murmurs. Lungs: clear Abdomen: soft, nontender, mild distention. No hepatosplenomegaly. No bruits or masses. Good bowel sounds. Extremities: no cyanosis, clubbing, rash, tr edema  Neuro: alert & oriented x 3, cranial nerves grossly intact. moves all 4 extremities w/o difficulty. Flat Affect    ASSESSMENT & PLAN:

## 2012-05-28 NOTE — Assessment & Plan Note (Signed)
Patient seen and examined with Junie Bame, NP. We discussed all aspects of the encounter. I agree with the assessment as stated above. See my thoughts below.  Continues with NYHA III symptoms. He is volume overloaded. Will have him take metolazone today and increase lasix to 80 bid. Watch renal function closely. He appears to have severe OSA. Will refer to Pulmonary for sleep study.

## 2012-05-31 ENCOUNTER — Encounter: Payer: Self-pay | Admitting: Internal Medicine

## 2012-05-31 ENCOUNTER — Ambulatory Visit (INDEPENDENT_AMBULATORY_CARE_PROVIDER_SITE_OTHER): Payer: PRIVATE HEALTH INSURANCE | Admitting: Internal Medicine

## 2012-05-31 VITALS — BP 105/59 | HR 65 | Temp 97.9°F | Ht 70.75 in | Wt 246.8 lb

## 2012-05-31 DIAGNOSIS — I5022 Chronic systolic (congestive) heart failure: Secondary | ICD-10-CM

## 2012-05-31 DIAGNOSIS — I10 Essential (primary) hypertension: Secondary | ICD-10-CM

## 2012-05-31 NOTE — Assessment & Plan Note (Signed)
BP Readings from Last 3 Encounters:  05/31/12 105/59  05/26/12 130/70  05/17/12 147/88    Lab Results  Component Value Date   NA 140 05/17/2012   K 4.3 05/17/2012   CREATININE 0.83 05/17/2012    Assessment: Blood pressure control:   at goal Progress toward BP goal:    good Comments:   Plan: Medications:  continue current medications, Continue Coreg, clonidine, Lasix, BiDil, labetalol, lisinopril Educational resources provided: brochure;handout;video Self management tools provided:   Other plans: No new changes.

## 2012-05-31 NOTE — Progress Notes (Signed)
  Subjective:    Patient ID: CAYLOB CHAVANA, male    DOB: 1956/01/05, 57 y.o.   MRN: [id number]  HPI patient is a pleasant 57 year old man with hypertension, systolic heart failure, alcohol use and multiple problems as per problem list who comes to clinic for followup visit after his recent ER visits.  She was seen in ER twice in April for worsening shortness of breath. He was appropriately treated with diuretics. He saw Dr. Haroldine Laws on 05/26/2012 in heart failure clinic who adjusted his dose of Lasix and give him metolazone for that day. After that dose adjustment, patient feels much better- says he has lost about 4 pounds.  He does not have anymore short of breath and is able to lie flat.  He denies any fever, chills, nausea, vomiting, abdominal pain, chest pain, Diarrhea, headache, palpitations.    Review of Systems    as per history of present illness. Objective:   Physical Exam  General: NAD HEENT: PERRL, EOMI, no scleral icterus Cardiac: S1, S2, RRR, no rubs, murmurs or gallops Pulm: clear to auscultation bilaterally, moving normal volumes of air Abd: soft, nontender, nondistended, BS present Ext: warm and well perfused, no pedal edema Neuro: alert and oriented X3, cranial nerves II-XII grossly intact       Assessment & Plan:

## 2012-05-31 NOTE — Patient Instructions (Signed)
Please followup with Dr. Murlean Caller on 07/07/2012.  Followup with Dr. Haroldine Laws next week and follow his recommendations closely. Continue taking all medications regularly.

## 2012-05-31 NOTE — Assessment & Plan Note (Addendum)
Patient with symptom improvement with recent changes per Dr. Haroldine Laws.  - 2 pounds less than when he saw Dr. Haroldine Laws.  - No pedal edema, no dyspnea or orthopnea. - Continue current treatment including beta blocker, Lasix, BiDil, labetalol, ACE inhibitor, aspirin and statin. - Followup with Dr. Haroldine Laws on 06/10/2012.

## 2012-06-01 NOTE — Progress Notes (Signed)
I discussed this case with Dr. Patel soon after he saw the patient , I have read the documentation and I agree with the plan of care. Please see the resident note for details of management.  

## 2012-06-06 ENCOUNTER — Other Ambulatory Visit: Payer: Self-pay | Admitting: *Deleted

## 2012-06-06 MED ORDER — LABETALOL HCL 100 MG PO TABS: 100.0000 mg | ORAL_TABLET | Freq: Two times a day (BID) | ORAL | Status: DC

## 2012-06-10 ENCOUNTER — Inpatient Hospital Stay (HOSPITAL_COMMUNITY): Admission: RE | Admit: 2012-06-10 | Payer: PRIVATE HEALTH INSURANCE | Source: Ambulatory Visit

## 2012-06-13 ENCOUNTER — Encounter (HOSPITAL_COMMUNITY): Payer: Self-pay | Admitting: *Deleted

## 2012-06-15 ENCOUNTER — Emergency Department (HOSPITAL_COMMUNITY)
Admission: EM | Admit: 2012-06-15 | Discharge: 2012-06-16 | Disposition: A | Discharge status: Home / Self Care | Payer: PRIVATE HEALTH INSURANCE | Attending: Emergency Medicine | Admitting: Emergency Medicine

## 2012-06-15 ENCOUNTER — Encounter (HOSPITAL_COMMUNITY): Payer: Self-pay | Admitting: *Deleted

## 2012-06-15 DIAGNOSIS — M79609 Pain in unspecified limb: Secondary | ICD-10-CM | POA: Insufficient documentation

## 2012-06-15 DIAGNOSIS — M129 Arthropathy, unspecified: Secondary | ICD-10-CM | POA: Insufficient documentation

## 2012-06-15 DIAGNOSIS — I10 Essential (primary) hypertension: Secondary | ICD-10-CM | POA: Insufficient documentation

## 2012-06-15 DIAGNOSIS — Z8619 Personal history of other infectious and parasitic diseases: Secondary | ICD-10-CM | POA: Insufficient documentation

## 2012-06-15 DIAGNOSIS — Z87891 Personal history of nicotine dependence: Secondary | ICD-10-CM | POA: Insufficient documentation

## 2012-06-15 DIAGNOSIS — R109 Unspecified abdominal pain: Secondary | ICD-10-CM

## 2012-06-15 DIAGNOSIS — I509 Heart failure, unspecified: Secondary | ICD-10-CM | POA: Insufficient documentation

## 2012-06-15 DIAGNOSIS — D75829 Heparin-induced thrombocytopenia, unspecified: Secondary | ICD-10-CM | POA: Insufficient documentation

## 2012-06-15 DIAGNOSIS — Z8719 Personal history of other diseases of the digestive system: Secondary | ICD-10-CM | POA: Insufficient documentation

## 2012-06-15 DIAGNOSIS — Z7982 Long term (current) use of aspirin: Secondary | ICD-10-CM | POA: Insufficient documentation

## 2012-06-15 DIAGNOSIS — F259 Schizoaffective disorder, unspecified: Secondary | ICD-10-CM | POA: Insufficient documentation

## 2012-06-15 DIAGNOSIS — Z79899 Other long term (current) drug therapy: Secondary | ICD-10-CM | POA: Insufficient documentation

## 2012-06-15 DIAGNOSIS — K219 Gastro-esophageal reflux disease without esophagitis: Secondary | ICD-10-CM | POA: Insufficient documentation

## 2012-06-15 DIAGNOSIS — K859 Acute pancreatitis without necrosis or infection, unspecified: Secondary | ICD-10-CM

## 2012-06-15 DIAGNOSIS — D7582 Heparin induced thrombocytopenia (HIT): Secondary | ICD-10-CM | POA: Insufficient documentation

## 2012-06-15 DIAGNOSIS — F10929 Alcohol use, unspecified with intoxication, unspecified: Secondary | ICD-10-CM

## 2012-06-15 DIAGNOSIS — M7989 Other specified soft tissue disorders: Secondary | ICD-10-CM | POA: Insufficient documentation

## 2012-06-15 DIAGNOSIS — E785 Hyperlipidemia, unspecified: Secondary | ICD-10-CM | POA: Insufficient documentation

## 2012-06-15 DIAGNOSIS — Z8669 Personal history of other diseases of the nervous system and sense organs: Secondary | ICD-10-CM | POA: Insufficient documentation

## 2012-06-15 DIAGNOSIS — F10229 Alcohol dependence with intoxication, unspecified: Secondary | ICD-10-CM | POA: Insufficient documentation

## 2012-06-15 LAB — COMPREHENSIVE METABOLIC PANEL
AST: 72 U/L — ABNORMAL HIGH (ref 0–37)
Albumin: 4 g/dL (ref 3.5–5.2)
Alkaline Phosphatase: 99 U/L (ref 39–117)
Chloride: 105 mEq/L (ref 96–112)
Potassium: 4.9 mEq/L (ref 3.5–5.1)
Sodium: 140 mEq/L (ref 135–145)
Total Bilirubin: 0.3 mg/dL (ref 0.3–1.2)

## 2012-06-15 LAB — CBC WITH DIFFERENTIAL/PLATELET
Basophils Absolute: 0 10*3/uL (ref 0.0–0.1)
Basophils Relative: 1 % (ref 0–1)
Hemoglobin: 14.9 g/dL (ref 13.0–17.0)
MCHC: 34.5 g/dL (ref 30.0–36.0)
Neutro Abs: 2.3 10*3/uL (ref 1.7–7.7)
Neutrophils Relative %: 41 % — ABNORMAL LOW (ref 43–77)
RDW: 14 % (ref 11.5–15.5)

## 2012-06-15 LAB — ETHANOL: Alcohol, Ethyl (B): 319 mg/dL — ABNORMAL HIGH (ref 0–11)

## 2012-06-15 MED ORDER — ONDANSETRON HCL 4 MG/2ML IJ SOLN
4.0000 mg | Freq: Once | INTRAMUSCULAR | Status: AC
  Administered 2012-06-15: 4 mg via INTRAVENOUS
  Filled 2012-06-15: qty 2

## 2012-06-15 MED ORDER — SODIUM CHLORIDE 0.9 % IV BOLUS (SEPSIS)
1000.0000 mL | Freq: Once | INTRAVENOUS | Status: AC
Start: 2012-06-15 — End: 2012-06-16
  Administered 2012-06-15: 1000 mL via INTRAVENOUS

## 2012-06-15 MED ORDER — GI COCKTAIL ~~LOC~~
30.0000 mL | Freq: Once | ORAL | Status: AC
  Administered 2012-06-15: 30 mL via ORAL
  Filled 2012-06-15: qty 30

## 2012-06-15 MED ORDER — MORPHINE SULFATE 2 MG/ML IJ SOLN
2.0000 mg | Freq: Once | INTRAMUSCULAR | Status: AC
  Administered 2012-06-15: 2 mg via INTRAVENOUS
  Filled 2012-06-15: qty 1

## 2012-06-15 MED ORDER — SODIUM CHLORIDE 0.9 % IV BOLUS (SEPSIS)
1000.0000 mL | Freq: Once | INTRAVENOUS | Status: AC
  Administered 2012-06-16: 1000 mL via INTRAVENOUS

## 2012-06-15 MED ORDER — PANTOPRAZOLE SODIUM 40 MG IV SOLR
40.0000 mg | Freq: Once | INTRAVENOUS | Status: AC
  Administered 2012-06-15: 40 mg via INTRAVENOUS
  Filled 2012-06-15: qty 40

## 2012-06-15 NOTE — ED Provider Notes (Signed)
History     CSN: [id number]  Arrival date & time 06/15/12  2029   First MD Initiated Contact with Patient 06/15/12 2311      Chief Complaint  Patient presents with  . Abdominal Pain   HPI  History provided by the patient. Patient is a 57 year old male with history of hypertension, hyperlipidemia, CHF, hepatitis C, polysubstance abuse, alcoholism who presents with complaints of abdominal pain. Patient admits to recent alcohol abuse for the past 2 weeks. States he has been drinking mostly liquor occasional beer. Today he had 2 5ths of liquor and complains of increasing diffuse abdominal pains. He also complains of some continued pain and swelling to his right great toe. He states he was seen by a podiatrist and had his total removed 4 weeks ago. He states swelling has improved some but he has not been taking care of his toe as instructed and has occasional bleeding and oozing. He denies seeing any pus. Denies any red streaks up the foot. Denies any fever, chills or sweats. He denies any recent episodes of vomiting. No diarrhea or constipation. No other aggravating or alleviating factors. No other associated symptoms.    Past Medical History  Diagnosis Date  . H/O ETOH abuse   . Colitis   . GERD (gastroesophageal reflux disease)   . Hepatitis     HEP  C  . Cocaine abuse     HISTORY OF  . 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 Diverticulitis     H/O  . Headache   . Arthritis   . Schizophrenia, schizo-affective   . Schizo affective schizophrenia     TAKES ABILIFY  IF  NEEDED  . Cataract   . Hyperlipidemia   . Wears glasses   . Wears dentures     upper  . Hypertension   . Schizophrenia   . Hepatitis C   . HIT (heparin-induced thrombocytopenia)   . H/O: alcohol abuse   . H/O cocaine abuse   . Stroke   . CHF (congestive heart failure) EF 30-35% 02/29/2012    Past Surgical History  Procedure Laterality Date  . Back surgery      8 MTHS AGO  . Left  achilles    . Tonsillectomy    . Knee arthroscopy    . I&d extremity  03/20/2011    Procedure: IRRIGATION AND DEBRIDEMENT EXTREMITY;  Surgeon: Kerin Salen, MD;  Location: [facility];  Service: Orthopedics;  Laterality: Left;  I&D LEFT ACHILLIES TENDON  . Eye surgery      right eye  . Resection distal clavical  09/17/2011    Procedure: RESECTION DISTAL CLAVICAL;  Surgeon: Nita Sells, MD;  Location: [facility];  Service: Orthopedics;  Laterality: Right;  right shoulder arthroscopy with sad and open distal clavicle excision   . Back surgery      Family History  Problem Relation Age of Onset  . Heart disease Mother   . Heart disease Father   . Heart disease Sister   . CAD Mother   . CAD Father   . CAD Sister     History  Substance Use Topics  . Smoking status: Former Research scientist (life sciences)  . Smokeless tobacco: Not on file  . Alcohol Use: Yes     Comment: frequently-- 1/5th a day      Review of Systems  Constitutional: Negative for fever, chills and diaphoresis.  Respiratory: Negative for shortness of breath.   Cardiovascular: Negative  for chest pain.  Gastrointestinal: Positive for abdominal pain. Negative for nausea, vomiting, diarrhea and constipation.  All other systems reviewed and are negative.    Allergies  Thorazine  Home Medications   Current Outpatient Rx  Name  Route  Sig  Dispense  Refill  . ARIPiprazole (ABILIFY) 10 MG tablet   Oral   Take 10 mg by mouth daily.         Marland Kitchen aspirin 81 MG tablet   Oral   Take 81 mg by mouth daily.         . carvedilol (COREG) 6.25 MG tablet   Oral   Take 6.25 mg by mouth 2 (two) times daily with a meal.         . cephALEXin (KEFLEX) 500 MG capsule   Oral   Take 500 mg by mouth 2 (two) times daily.          . cloNIDine (CATAPRES) 0.2 MG tablet   Oral   Take 1 tablet (0.2 mg total) by mouth 2 (two) times daily.   60 tablet   1   . folic acid (FOLVITE) 1 MG tablet   Oral   Take 1 tablet (1 mg  total) by mouth daily.   30 tablet   1   . furosemide (LASIX) 40 MG tablet   Oral   Take 2 tablets (80 mg total) by mouth 2 (two) times daily.   120 tablet   3   . HYDROcodone-acetaminophen (NORCO/VICODIN) 5-325 MG per tablet   Oral   Take 1 tablet by mouth every 4 (four) hours as needed for pain.   90 tablet   2   . isosorbide-hydrALAZINE (BIDIL) 20-37.5 MG per tablet   Oral   Take 2 tablets by mouth 3 (three) times daily.   90 tablet   1   . ketoconazole (NIZORAL) 2 % cream   Topical   Apply 1 application topically daily.         Marland Kitchen labetalol (NORMODYNE) 100 MG tablet   Oral   Take 1 tablet (100 mg total) by mouth 2 (two) times daily.   60 tablet   2   . levofloxacin (LEVAQUIN) 500 MG tablet   Oral   Take 500 mg by mouth daily.         Marland Kitchen lisinopril (PRINIVIL,ZESTRIL) 20 MG tablet   Oral   Take 10-20 mg by mouth 2 (two) times daily. Take 20mg  in AM and 10mg  in PM         . potassium chloride (K-DUR,KLOR-CON) 10 MEQ tablet   Oral   Take 1 tablet (10 mEq total) by mouth 2 (two) times daily.   30 tablet   1   . rosuvastatin (CRESTOR) 20 MG tablet   Oral   Take 1 tablet (20 mg total) by mouth daily.   30 tablet   3   . terbinafine (LAMISIL) 250 MG tablet   Oral   Take 250 mg by mouth daily.         . traZODone (DESYREL) 100 MG tablet   Oral   Take 100 mg by mouth at bedtime.           BP 108/68  Pulse 107  Temp(Src) 99.3 F (37.4 C) (Oral)  Resp 22  Ht 5\' 11"  (1.803 m)  Wt 235 lb (106.595 kg)  BMI 32.79 kg/m2  SpO2 95%  Physical Exam  Nursing note and vitals reviewed. Constitutional: He is oriented to person, place, and  time. He appears well-developed and well-nourished. No distress.  HENT:  Head: Normocephalic.  Cardiovascular: Normal rate and regular rhythm.   No murmur heard. Pulmonary/Chest: Effort normal and breath sounds normal. No respiratory distress. He has no wheezes. He has no rales.  Abdominal: Soft. There is tenderness  in the epigastric area and left upper quadrant. There is no rigidity, no rebound, no guarding, no CVA tenderness, no tenderness at McBurney's point and negative Murphy's sign.  Diffuse tenderness. No mass. No peritoneal signs.  Musculoskeletal: Normal range of motion. He exhibits edema.  Right great toenail is removed. There is slight amount of swelling and crusting of fluid around the medial aspect of the nail. No active bleeding or purulent drainage. Mild bilateral edema to the lower feet and ankles  Neurological: He is alert and oriented to person, place, and time.  Skin: Skin is warm.  Psychiatric: He has a normal mood and affect. His behavior is normal.    ED Course  Procedures   Results for orders placed during the hospital encounter of 06/15/12  CBC WITH DIFFERENTIAL      Result Value Range   WBC 5.7  4.0 - 10.5 K/uL   RBC 5.05  4.22 - 5.81 MIL/uL   Hemoglobin 14.9  13.0 - 17.0 g/dL   HCT 43.2  39.0 - 52.0 %   MCV 85.5  78.0 - 100.0 fL   MCH 29.5  26.0 - 34.0 pg   MCHC 34.5  30.0 - 36.0 g/dL   RDW 14.0  11.5 - 15.5 %   Platelets 251  150 - 400 K/uL   Neutrophils Relative % 41 (*) 43 - 77 %   Neutro Abs 2.3  1.7 - 7.7 K/uL   Lymphocytes Relative 49 (*) 12 - 46 %   Lymphs Abs 2.8  0.7 - 4.0 K/uL   Monocytes Relative 8  3 - 12 %   Monocytes Absolute 0.5  0.1 - 1.0 K/uL   Eosinophils Relative 1  0 - 5 %   Eosinophils Absolute 0.1  0.0 - 0.7 K/uL   Basophils Relative 1  0 - 1 %   Basophils Absolute 0.0  0.0 - 0.1 K/uL  COMPREHENSIVE METABOLIC PANEL      Result Value Range   Sodium 140  135 - 145 mEq/L   Potassium 4.9  3.5 - 5.1 mEq/L   Chloride 105  96 - 112 mEq/L   CO2 19  19 - 32 mEq/L   Glucose, Bld 102 (*) 70 - 99 mg/dL   BUN 27 (*) 6 - 23 mg/dL   Creatinine, Ser 1.69 (*) 0.50 - 1.35 mg/dL   Calcium 9.5  8.4 - 10.5 mg/dL   Total Protein 8.4 (*) 6.0 - 8.3 g/dL   Albumin 4.0  3.5 - 5.2 g/dL   AST 72 (*) 0 - 37 U/L   ALT 36  0 - 53 U/L   Alkaline Phosphatase 99  39  - 117 U/L   Total Bilirubin 0.3  0.3 - 1.2 mg/dL   GFR calc non Af Amer 43 (*) >90 mL/min   GFR calc Af Amer 50 (*) >90 mL/min  URINALYSIS, ROUTINE W REFLEX MICROSCOPIC      Result Value Range   Color, Urine YELLOW  YELLOW   APPearance CLEAR  CLEAR   Specific Gravity, Urine 1.018  1.005 - 1.030   pH 5.0  5.0 - 8.0   Glucose, UA NEGATIVE  NEGATIVE mg/dL   Hgb urine dipstick NEGATIVE  NEGATIVE   Bilirubin Urine NEGATIVE  NEGATIVE   Ketones, ur NEGATIVE  NEGATIVE mg/dL   Protein, ur NEGATIVE  NEGATIVE mg/dL   Urobilinogen, UA 0.2  0.0 - 1.0 mg/dL   Nitrite NEGATIVE  NEGATIVE   Leukocytes, UA NEGATIVE  NEGATIVE  ETHANOL      Result Value Range   Alcohol, Ethyl (B) 319 (*) 0 - 11 mg/dL  LIPASE, BLOOD      Result Value Range   Lipase 70 (*) 11 - 59 U/L        1. Alcohol intoxication   2. Abdominal pain   3. Pancreatitis       MDM  11:05 PM patient seen and evaluated. Patient is comfortable he appears well this time in no acute distress.  Patient with mildly elevated lipase. Small dose pain medicine and IV fluids given.  Patient reports improvement of abdominal pains. Denies any pain or nausea at this time. Does complain of some general joint pains and complaints of toe pains. Patient states he has gout pains in his left foot and has continued pain to his right great toe after having nail removed.  Patient has normal WBC. Afebrile. Toe without significant for concerning findings to suggest secondary infection.  Patient's has been resting and sleeping. Alcohol level 300. Patient has been here several hours. He is able to or without assistance and clinically does not appear significantly intoxicated. At this time will be able to discharge home.    Martie Lee, PA-C 06/16/12 [phone]

## 2012-06-15 NOTE — ED Notes (Signed)
Pt c/o right upper abd pain; states started drinking again x 1 wk; had two fifths today

## 2012-06-16 LAB — URINALYSIS, ROUTINE W REFLEX MICROSCOPIC
Glucose, UA: NEGATIVE mg/dL
Hgb urine dipstick: NEGATIVE
Ketones, ur: NEGATIVE mg/dL
Protein, ur: NEGATIVE mg/dL

## 2012-06-16 MED ORDER — CEPHALEXIN 500 MG PO CAPS: 500.0000 mg | ORAL_CAPSULE | Freq: Three times a day (TID) | ORAL | Status: DC

## 2012-06-16 MED ORDER — KETOROLAC TROMETHAMINE 30 MG/ML IJ SOLN
30.0000 mg | Freq: Once | INTRAMUSCULAR | Status: AC
  Administered 2012-06-16: 30 mg via INTRAVENOUS
  Filled 2012-06-16: qty 1

## 2012-06-16 NOTE — ED Provider Notes (Signed)
Medical screening examination/treatment/procedure(s) were performed by non-physician practitioner and as supervising physician I was immediately available for consultation/collaboration.   Orpah Greek, MD 06/16/12 2322

## 2012-06-20 ENCOUNTER — Emergency Department (HOSPITAL_COMMUNITY)
Admission: EM | Admit: 2012-06-20 | Discharge: 2012-06-20 | Disposition: A | Discharge status: Home / Self Care | Payer: PRIVATE HEALTH INSURANCE | Attending: Emergency Medicine | Admitting: Emergency Medicine

## 2012-06-20 ENCOUNTER — Encounter (HOSPITAL_COMMUNITY): Payer: Self-pay | Admitting: Emergency Medicine

## 2012-06-20 DIAGNOSIS — Z79899 Other long term (current) drug therapy: Secondary | ICD-10-CM | POA: Insufficient documentation

## 2012-06-20 DIAGNOSIS — R109 Unspecified abdominal pain: Secondary | ICD-10-CM

## 2012-06-20 DIAGNOSIS — K219 Gastro-esophageal reflux disease without esophagitis: Secondary | ICD-10-CM | POA: Insufficient documentation

## 2012-06-20 DIAGNOSIS — Z8669 Personal history of other diseases of the nervous system and sense organs: Secondary | ICD-10-CM | POA: Insufficient documentation

## 2012-06-20 DIAGNOSIS — Z862 Personal history of diseases of the blood and blood-forming organs and certain disorders involving the immune mechanism: Secondary | ICD-10-CM | POA: Insufficient documentation

## 2012-06-20 DIAGNOSIS — Z8719 Personal history of other diseases of the digestive system: Secondary | ICD-10-CM | POA: Insufficient documentation

## 2012-06-20 DIAGNOSIS — Z87891 Personal history of nicotine dependence: Secondary | ICD-10-CM | POA: Insufficient documentation

## 2012-06-20 DIAGNOSIS — Z8619 Personal history of other infectious and parasitic diseases: Secondary | ICD-10-CM | POA: Insufficient documentation

## 2012-06-20 DIAGNOSIS — F259 Schizoaffective disorder, unspecified: Secondary | ICD-10-CM | POA: Insufficient documentation

## 2012-06-20 DIAGNOSIS — E860 Dehydration: Secondary | ICD-10-CM | POA: Insufficient documentation

## 2012-06-20 DIAGNOSIS — M129 Arthropathy, unspecified: Secondary | ICD-10-CM | POA: Insufficient documentation

## 2012-06-20 DIAGNOSIS — R1032 Left lower quadrant pain: Secondary | ICD-10-CM | POA: Insufficient documentation

## 2012-06-20 DIAGNOSIS — I10 Essential (primary) hypertension: Secondary | ICD-10-CM | POA: Insufficient documentation

## 2012-06-20 DIAGNOSIS — I509 Heart failure, unspecified: Secondary | ICD-10-CM | POA: Insufficient documentation

## 2012-06-20 DIAGNOSIS — E785 Hyperlipidemia, unspecified: Secondary | ICD-10-CM | POA: Insufficient documentation

## 2012-06-20 DIAGNOSIS — F1010 Alcohol abuse, uncomplicated: Secondary | ICD-10-CM

## 2012-06-20 DIAGNOSIS — Z8673 Personal history of transient ischemic attack (TIA), and cerebral infarction without residual deficits: Secondary | ICD-10-CM | POA: Insufficient documentation

## 2012-06-20 LAB — CBC WITH DIFFERENTIAL/PLATELET
Basophils Relative: 0 % (ref 0–1)
Eosinophils Absolute: 0 10*3/uL (ref 0.0–0.7)
HCT: 42.5 % (ref 39.0–52.0)
Hemoglobin: 14.8 g/dL (ref 13.0–17.0)
Lymphs Abs: 1.5 10*3/uL (ref 0.7–4.0)
MCH: 29.9 pg (ref 26.0–34.0)
MCHC: 34.8 g/dL (ref 30.0–36.0)
MCV: 85.9 fL (ref 78.0–100.0)
Monocytes Absolute: 0.5 10*3/uL (ref 0.1–1.0)
Monocytes Relative: 7 % (ref 3–12)
Neutrophils Relative %: 72 % (ref 43–77)
RBC: 4.95 MIL/uL (ref 4.22–5.81)

## 2012-06-20 LAB — URINALYSIS, ROUTINE W REFLEX MICROSCOPIC
Ketones, ur: 15 mg/dL — AB
Leukocytes, UA: NEGATIVE
Nitrite: NEGATIVE
Specific Gravity, Urine: 1.033 — ABNORMAL HIGH (ref 1.005–1.030)
Urobilinogen, UA: 1 mg/dL (ref 0.0–1.0)

## 2012-06-20 LAB — COMPREHENSIVE METABOLIC PANEL
Albumin: 4.1 g/dL (ref 3.5–5.2)
Alkaline Phosphatase: 100 U/L (ref 39–117)
BUN: 31 mg/dL — ABNORMAL HIGH (ref 6–23)
Creatinine, Ser: 1.31 mg/dL (ref 0.50–1.35)
GFR calc Af Amer: 68 mL/min — ABNORMAL LOW (ref >90)
Glucose, Bld: 96 mg/dL (ref 70–99)
Potassium: 4.2 mEq/L (ref 3.5–5.1)
Total Bilirubin: 0.6 mg/dL (ref 0.3–1.2)
Total Protein: 8.6 g/dL — ABNORMAL HIGH (ref 6.0–8.3)

## 2012-06-20 LAB — LIPASE, BLOOD: Lipase: 76 U/L — ABNORMAL HIGH (ref 11–59)

## 2012-06-20 LAB — BASIC METABOLIC PANEL
BUN: 27 mg/dL — ABNORMAL HIGH (ref 6–23)
CO2: 18 mEq/L — ABNORMAL LOW (ref 19–32)
Calcium: 9.4 mg/dL (ref 8.4–10.5)
Chloride: 103 mEq/L (ref 96–112)
Creatinine, Ser: 1.09 mg/dL (ref 0.50–1.35)

## 2012-06-20 MED ORDER — ONDANSETRON HCL 4 MG PO TABS: 4.0000 mg | ORAL_TABLET | Freq: Three times a day (TID) | ORAL | Status: DC | PRN

## 2012-06-20 MED ORDER — SODIUM CHLORIDE 0.9 % IV BOLUS (SEPSIS)
500.0000 mL | Freq: Once | INTRAVENOUS | Status: AC
  Administered 2012-06-20: 500 mL via INTRAVENOUS

## 2012-06-20 MED ORDER — ONDANSETRON HCL 4 MG/2ML IJ SOLN
4.0000 mg | Freq: Once | INTRAMUSCULAR | Status: AC
  Administered 2012-06-20: 4 mg via INTRAVENOUS
  Filled 2012-06-20: qty 2

## 2012-06-20 MED ORDER — HYDROMORPHONE HCL PF 1 MG/ML IJ SOLN
1.0000 mg | Freq: Once | INTRAMUSCULAR | Status: AC
  Administered 2012-06-20: 1 mg via INTRAVENOUS
  Filled 2012-06-20: qty 1

## 2012-06-20 MED ORDER — OXYCODONE-ACETAMINOPHEN 5-325 MG PO TABS: 1.0000 | ORAL_TABLET | ORAL | Status: DC | PRN

## 2012-06-20 MED ORDER — SODIUM CHLORIDE 0.9 % IV SOLN
INTRAVENOUS | Status: AC
  Administered 2012-06-20: 18:00:00 via INTRAVENOUS

## 2012-06-20 NOTE — ED Notes (Signed)
Per EMS: pt c/o abd pain with nausea and generalized weakness x 1.5 week; pt seen at [facility] for same and diagnosed with inflamed pancreas per EMS

## 2012-06-20 NOTE — ED Provider Notes (Signed)
History     CSN: [id number]  Arrival date & time 06/20/12  1238   First MD Initiated Contact with Patient 06/20/12 1502      Chief Complaint  Patient presents with  . Abdominal Pain    (Consider location/radiation/quality/duration/timing/severity/associated sxs/prior treatment) HPI Comments: 57 year old male with a history of nonischemic cardiomyopathy, congestive heart failure, hypertension , alcohol abuse, schizophrenia, chronic pancreatitis secondary to his alcohol use. He also has a history of diverticulitis. He reports that over the last 10 days he has been suffering from abdominal pain located in the left lower quadrant and mid abdomen which seems to come and go, gets worse with drinking alcohol, better when he rests, he has been drinking alcohol for the last 10 days ago he states that he was clean for several months before that. This pain is located mostly in the left side of his abdomen, associated with nausea, severe at times.  He reports that he does have a history of diverticulitis though he cannot remember what it was. According to the prior medical record he was admitted in January with rhabdomyolysis and acute renal failure, improved with fluids and Lasix, had another admission for acute congestive heart failure in the last 2 months at which time he required diuresis. 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 He states that he has been taking his medication but unfortunately has been drinking alcohol as well including at least 40 ounces of beer prior to arrival today.  Last Echo   02/22/2012        TEE performed without complications: LVEF [id number] gllobal hypokinesis. RV mod dilatation with decreased EF moderate. Severe pulmonary hypertension Mild TR, Trace MR.   Interatrial septum bulges to the left suggests  elevated right heart pressure. PFO with spontaneous color and double contrast shunting at rest and Valsalva.   Aorta normal/ Imp: Paradoxical embolus with PFO with strongly positive contrast study for right to left shunting could be source of cerebral embolism.   Recent Cath  Assessment: 1. Minimal CAD 2. NICM (likely hypertensive in nature) with EF 35-40% 3. Preserved cardiac output  Patient is a 57 y.o. male presenting with abdominal pain.  Abdominal Pain Associated symptoms include abdominal pain.    Past Medical History  Diagnosis Date  . H/O ETOH abuse   . Colitis   . GERD (gastroesophageal reflux disease)   . Hepatitis     HEP  C  . Cocaine abuse     HISTORY OF  . 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 Diverticulitis     H/O  . Headache   . Arthritis   . Schizophrenia, schizo-affective   . Schizo affective schizophrenia     TAKES ABILIFY  IF  NEEDED  . Cataract   . Hyperlipidemia   . Wears glasses   . Wears dentures     upper  . Hypertension   . Schizophrenia   . Hepatitis C   . HIT (heparin-induced thrombocytopenia)   . H/O: alcohol abuse   . H/O cocaine abuse   . Stroke   . CHF (congestive heart failure) EF 30-35% 02/29/2012    Past Surgical History  Procedure Laterality Date  . Back surgery      8 MTHS AGO  . Left achilles    . Tonsillectomy    .  Knee arthroscopy    . I&d extremity  03/20/2011    Procedure: IRRIGATION AND DEBRIDEMENT EXTREMITY;  Surgeon: Kerin Salen, MD;  Location: [facility];  Service: Orthopedics;  Laterality: Left;  I&D LEFT ACHILLIES TENDON  . Eye surgery      right eye  . Resection distal clavical  09/17/2011    Procedure: RESECTION DISTAL CLAVICAL;  Surgeon: Nita Sells, MD;  Location: [facility];  Service: Orthopedics;  Laterality: Right;  right shoulder arthroscopy with sad and open distal clavicle excision   . Back surgery      Family History  Problem Relation Age of Onset  .  Heart disease Mother   . Heart disease Father   . Heart disease Sister   . CAD Mother   . CAD Father   . CAD Sister     History  Substance Use Topics  . Smoking status: Former Research scientist (life sciences)  . Smokeless tobacco: Not on file  . Alcohol Use: Yes     Comment: frequently-- 1/5th a day      Review of Systems  Gastrointestinal: Positive for abdominal pain.  All other systems reviewed and are negative.    Allergies  Thorazine  Home Medications   Current Outpatient Rx  Name  Route  Sig  Dispense  Refill  . carvedilol (COREG) 6.25 MG tablet   Oral   Take 6.25 mg by mouth 2 (two) times daily with a meal.         . cephALEXin (KEFLEX) 500 MG capsule   Oral   Take 1 capsule (500 mg total) by mouth 3 (three) times daily.   21 capsule   0   . cloNIDine (CATAPRES) 0.2 MG tablet   Oral   Take 1 tablet (0.2 mg total) by mouth 2 (two) times daily.   60 tablet   1   . folic acid (FOLVITE) 1 MG tablet   Oral   Take 1 tablet (1 mg total) by mouth daily.   30 tablet   1   . furosemide (LASIX) 40 MG tablet   Oral   Take 2 tablets (80 mg total) by mouth 2 (two) times daily.   120 tablet   3   . isosorbide-hydrALAZINE (BIDIL) 20-37.5 MG per tablet   Oral   Take 2 tablets by mouth 3 (three) times daily.   90 tablet   1   . ketoconazole (NIZORAL) 2 % cream   Topical   Apply 1 application topically at bedtime.          Marland Kitchen labetalol (NORMODYNE) 100 MG tablet   Oral   Take 1 tablet (100 mg total) by mouth 2 (two) times daily.   60 tablet   2   . lisinopril (PRINIVIL,ZESTRIL) 20 MG tablet   Oral   Take 10-20 mg by mouth 2 (two) times daily. Take 20mg  in AM and 10mg  in PM         . potassium chloride (K-DUR,KLOR-CON) 10 MEQ tablet   Oral   Take 1 tablet (10 mEq total) by mouth 2 (two) times daily.   30 tablet   1   . rosuvastatin (CRESTOR) 20 MG tablet   Oral   Take 1 tablet (20 mg total) by mouth daily.   30 tablet   3   . traZODone (DESYREL) 100 MG tablet    Oral   Take 100 mg by mouth at bedtime.         . ondansetron (ZOFRAN)  4 MG tablet   Oral   Take 1 tablet (4 mg total) by mouth every 8 (eight) hours as needed for nausea.   20 tablet   0   . oxyCODONE-acetaminophen (PERCOCET) 5-325 MG per tablet   Oral   Take 1 tablet by mouth every 4 (four) hours as needed for pain.   8 tablet   0     BP 175/96  Pulse 101  Temp(Src) 98.5 F (36.9 C) (Oral)  Resp 16  SpO2 97%  Physical Exam  Nursing note and vitals reviewed. Constitutional: He appears well-developed and well-nourished. No distress.  HENT:  Head: Normocephalic and atraumatic.  Mouth/Throat: Oropharynx is clear and moist. No oropharyngeal exudate.  Eyes: Conjunctivae and EOM are normal. Pupils are equal, round, and reactive to light. Right eye exhibits no discharge. Left eye exhibits no discharge. No scleral icterus.  Neck: Normal range of motion. Neck supple. No JVD present. No thyromegaly present.  Cardiovascular: Normal rate, regular rhythm, normal heart sounds and intact distal pulses.  Exam reveals no gallop and no friction rub.   No murmur heard. Pulmonary/Chest: Effort normal and breath sounds normal. No respiratory distress. He has no wheezes. He has no rales.  Abdominal: Soft. Bowel sounds are normal. He exhibits no distension and no mass. There is tenderness ( Mild tenderness in the left upper quadrant and epigastrium, moderate tenderness in the left lower quadrant with guarding, no pain in the right side of the abdomen, no right upper quadrant tenderness or masses).  No hepatosplenomegaly, no peritoneal signs, nonsurgical abdomen  Musculoskeletal: Normal range of motion. He exhibits no edema and no tenderness.  Lymphadenopathy:    He has no cervical adenopathy.  Neurological: He is alert. Coordination normal.  Skin: Skin is warm and dry. No rash noted. No erythema.  Psychiatric: He has a normal mood and affect. His behavior is normal.    ED Course  Procedures  (including critical care time)  Labs Reviewed  COMPREHENSIVE METABOLIC PANEL - Abnormal; Notable for the following:    CO2 15 (*)    BUN 31 (*)    Total Protein 8.6 (*)    AST 108 (*)    ALT 58 (*)    GFR calc non Af Amer 58 (*)    GFR calc Af Amer 68 (*)    All other components within normal limits  LIPASE, BLOOD - Abnormal; Notable for the following:    Lipase 76 (*)    All other components within normal limits  URINALYSIS, ROUTINE W REFLEX MICROSCOPIC - Abnormal; Notable for the following:    APPearance HAZY (*)    Specific Gravity, Urine 1.033 (*)    Bilirubin Urine SMALL (*)    Ketones, ur 15 (*)    Protein, ur 100 (*)    All other components within normal limits  URINE MICROSCOPIC-ADD ON - Abnormal; Notable for the following:    Casts HYALINE CASTS (*)    All other components within normal limits  BASIC METABOLIC PANEL - Abnormal; Notable for the following:    CO2 18 (*)    BUN 27 (*)    GFR calc non Af Amer 73 (*)    GFR calc Af Amer 85 (*)    All other components within normal limits  CBC WITH DIFFERENTIAL  AMYLASE   No results found.   1. Abdominal pain   2. Dehydration   3. Alcohol abuse       MDM  The patient is not  appear to be in distress, he has mild hypertension at this time but no tachycardia, no fever and his laboratory workup thus far shows that he has a improving renal function with a creatinine of 1.3, slight uremia at 31, mild transaminitis consistent with his alcohol use and a slight decrease in his CO2 level. His lipase is 76, blood counts are normal. I am concerned given his alcohol use this is because of his transaminitis as well as likely some chronic pancreatitis however a lot of his pain is coming from his lower left abdomen and his lower back, we'll need to evaluate for urinary tract infection, consider diverticulitis, will abstain from contrasted CT scan and treat presumptively for diverticulitis with no other source to avoid contrast  nephropathy.  The patient needs some IV fluids given his uremia and likely dehydrated status but given his congestive heart failure this will need to be monitored closely, 500 cc at this time.  Repeat dose of IV fluids has been given, the patient has no shortness of breath, his CO2 is improving,, anion gap is closing, urinalysis confirms significant dehydration with ketonuria and a high specific gravity. Tolerating fluids, stable for discharge. Caution the patient against ongoing alcohol use as this likely is the source of the patient's pain related to recurrent pancreatitis  Will be discharged with antiemetics.  I have reexamined the patient prior to discharge, minimal abdominal tenderness, tolerating fluids, pain level has improved, no guarding, Is closing, heart rate is 100 beats per minute on my exam.        Johnna Acosta, MD 06/20/12 2123

## 2012-06-24 ENCOUNTER — Inpatient Hospital Stay (HOSPITAL_COMMUNITY)
Admission: EM | Admit: 2012-06-24 | Discharge: 2012-06-27 | DRG: 378 | Disposition: A | Discharge status: Home / Self Care | Payer: PRIVATE HEALTH INSURANCE | Attending: Internal Medicine | Admitting: Internal Medicine

## 2012-06-24 ENCOUNTER — Encounter (HOSPITAL_COMMUNITY): Payer: Self-pay | Admitting: *Deleted

## 2012-06-24 DIAGNOSIS — K7030 Alcoholic cirrhosis of liver without ascites: Secondary | ICD-10-CM | POA: Diagnosis present

## 2012-06-24 DIAGNOSIS — F209 Schizophrenia, unspecified: Secondary | ICD-10-CM | POA: Diagnosis present

## 2012-06-24 DIAGNOSIS — Z8719 Personal history of other diseases of the digestive system: Secondary | ICD-10-CM

## 2012-06-24 DIAGNOSIS — K922 Gastrointestinal hemorrhage, unspecified: Secondary | ICD-10-CM

## 2012-06-24 DIAGNOSIS — I426 Alcoholic cardiomyopathy: Secondary | ICD-10-CM | POA: Diagnosis present

## 2012-06-24 DIAGNOSIS — N182 Chronic kidney disease, stage 2 (mild): Secondary | ICD-10-CM | POA: Diagnosis present

## 2012-06-24 DIAGNOSIS — I509 Heart failure, unspecified: Secondary | ICD-10-CM | POA: Diagnosis present

## 2012-06-24 DIAGNOSIS — Q211 Atrial septal defect: Secondary | ICD-10-CM

## 2012-06-24 DIAGNOSIS — K861 Other chronic pancreatitis: Secondary | ICD-10-CM | POA: Diagnosis present

## 2012-06-24 DIAGNOSIS — F1910 Other psychoactive substance abuse, uncomplicated: Secondary | ICD-10-CM | POA: Diagnosis present

## 2012-06-24 DIAGNOSIS — K254 Chronic or unspecified gastric ulcer with hemorrhage: Principal | ICD-10-CM | POA: Diagnosis present

## 2012-06-24 DIAGNOSIS — E785 Hyperlipidemia, unspecified: Secondary | ICD-10-CM | POA: Diagnosis present

## 2012-06-24 DIAGNOSIS — Z8673 Personal history of transient ischemic attack (TIA), and cerebral infarction without residual deficits: Secondary | ICD-10-CM

## 2012-06-24 DIAGNOSIS — F259 Schizoaffective disorder, unspecified: Secondary | ICD-10-CM | POA: Diagnosis present

## 2012-06-24 DIAGNOSIS — I43 Cardiomyopathy in diseases classified elsewhere: Secondary | ICD-10-CM | POA: Diagnosis present

## 2012-06-24 DIAGNOSIS — Q2111 Secundum atrial septal defect: Secondary | ICD-10-CM

## 2012-06-24 DIAGNOSIS — D62 Acute posthemorrhagic anemia: Secondary | ICD-10-CM | POA: Diagnosis present

## 2012-06-24 DIAGNOSIS — I10 Essential (primary) hypertension: Secondary | ICD-10-CM | POA: Diagnosis present

## 2012-06-24 DIAGNOSIS — K7460 Unspecified cirrhosis of liver: Secondary | ICD-10-CM

## 2012-06-24 DIAGNOSIS — K2960 Other gastritis without bleeding: Secondary | ICD-10-CM | POA: Diagnosis present

## 2012-06-24 DIAGNOSIS — Z91199 Patient's noncompliance with other medical treatment and regimen due to unspecified reason: Secondary | ICD-10-CM

## 2012-06-24 DIAGNOSIS — Z79899 Other long term (current) drug therapy: Secondary | ICD-10-CM

## 2012-06-24 DIAGNOSIS — D696 Thrombocytopenia, unspecified: Secondary | ICD-10-CM | POA: Diagnosis present

## 2012-06-24 DIAGNOSIS — Z9119 Patient's noncompliance with other medical treatment and regimen: Secondary | ICD-10-CM

## 2012-06-24 DIAGNOSIS — F1020 Alcohol dependence, uncomplicated: Secondary | ICD-10-CM | POA: Insufficient documentation

## 2012-06-24 DIAGNOSIS — I5042 Chronic combined systolic (congestive) and diastolic (congestive) heart failure: Secondary | ICD-10-CM | POA: Diagnosis present

## 2012-06-24 DIAGNOSIS — B1710 Acute hepatitis C without hepatic coma: Secondary | ICD-10-CM | POA: Diagnosis present

## 2012-06-24 DIAGNOSIS — K250 Acute gastric ulcer with hemorrhage: Secondary | ICD-10-CM

## 2012-06-24 DIAGNOSIS — Z87891 Personal history of nicotine dependence: Secondary | ICD-10-CM

## 2012-06-24 DIAGNOSIS — I130 Hypertensive heart and chronic kidney disease with heart failure and stage 1 through stage 4 chronic kidney disease, or unspecified chronic kidney disease: Secondary | ICD-10-CM | POA: Diagnosis present

## 2012-06-24 HISTORY — DX: Rhabdomyolysis: M62.82

## 2012-06-24 HISTORY — DX: Personal history of other diseases of the digestive system: Z87.19

## 2012-06-24 HISTORY — DX: Fatty (change of) liver, not elsewhere classified: K76.0

## 2012-06-24 HISTORY — DX: Cyst of spleen: D73.4

## 2012-06-24 HISTORY — DX: Chronic combined systolic (congestive) and diastolic (congestive) heart failure: I50.42

## 2012-06-24 HISTORY — DX: Chronic kidney disease, stage 2 (mild): N18.2

## 2012-06-24 HISTORY — DX: Alcohol dependence, uncomplicated: F10.20

## 2012-06-24 HISTORY — DX: Spinal stenosis, lumbar region without neurogenic claudication: M48.061

## 2012-06-24 HISTORY — DX: Atrial septal defect: Q21.1

## 2012-06-24 HISTORY — DX: Pure hyperglyceridemia: E78.1

## 2012-06-24 LAB — URINALYSIS, ROUTINE W REFLEX MICROSCOPIC
Glucose, UA: NEGATIVE mg/dL
Leukocytes, UA: NEGATIVE
Protein, ur: NEGATIVE mg/dL
Urobilinogen, UA: 0.2 mg/dL (ref 0.0–1.0)

## 2012-06-24 LAB — CBC WITH DIFFERENTIAL/PLATELET
Basophils Absolute: 0 10*3/uL (ref 0.0–0.1)
Basophils Relative: 0 % (ref 0–1)
Eosinophils Absolute: 0.1 10*3/uL (ref 0.0–0.7)
Eosinophils Relative: 1 % (ref 0–5)
HCT: 21 % — ABNORMAL LOW (ref 39.0–52.0)
Lymphocytes Relative: 18 % (ref 12–46)
MCH: 29.1 pg (ref 26.0–34.0)
MCHC: 33.8 g/dL (ref 30.0–36.0)
MCV: 86.1 fL (ref 78.0–100.0)
Monocytes Absolute: 0.7 10*3/uL (ref 0.1–1.0)
RDW: 14.1 % (ref 11.5–15.5)

## 2012-06-24 LAB — COMPREHENSIVE METABOLIC PANEL
AST: 23 U/L (ref 0–37)
CO2: 22 mEq/L (ref 19–32)
Calcium: 8.3 mg/dL — ABNORMAL LOW (ref 8.4–10.5)
Creatinine, Ser: 1.29 mg/dL (ref 0.50–1.35)
GFR calc non Af Amer: 60 mL/min — ABNORMAL LOW (ref >90)
Total Protein: 5.1 g/dL — ABNORMAL LOW (ref 6.0–8.3)

## 2012-06-24 LAB — POCT I-STAT TROPONIN I: Troponin i, poc: 0.05 ng/mL (ref 0.00–0.08)

## 2012-06-24 LAB — PROTIME-INR
INR: 1.33 (ref 0.00–1.49)
Prothrombin Time: 16.2 seconds — ABNORMAL HIGH (ref 11.6–15.2)

## 2012-06-24 LAB — PREPARE RBC (CROSSMATCH)

## 2012-06-24 MED ORDER — FENTANYL CITRATE 0.05 MG/ML IJ SOLN
100.0000 ug | Freq: Once | INTRAMUSCULAR | Status: AC
  Administered 2012-06-24: 100 ug via INTRAVENOUS
  Filled 2012-06-24: qty 2

## 2012-06-24 MED ORDER — SODIUM CHLORIDE 0.9 % IV SOLN
1000.0000 mL | Freq: Once | INTRAVENOUS | Status: AC
  Administered 2012-06-24: 1000 mL via INTRAVENOUS

## 2012-06-24 MED ORDER — ONDANSETRON HCL 4 MG/2ML IJ SOLN
4.0000 mg | Freq: Once | INTRAMUSCULAR | Status: AC
  Administered 2012-06-24: 4 mg via INTRAVENOUS
  Filled 2012-06-24: qty 2

## 2012-06-24 NOTE — ED Notes (Signed)
Per EMS: pt was on the toilet and had approx 6 episodes of dark diarrhea.  Started to feel dizzy, nauseous, SOB and experience substernal CP.  Now c/o RUQ abdominal pain.  BP 90/60, received 500 cc bolus, SOB resolved with 2 L [place].  HR 108.

## 2012-06-24 NOTE — ED Provider Notes (Signed)
History     CSN: [id number]  Arrival date & time 06/24/12  1932   First MD Initiated Contact with Patient 06/24/12 1956      Chief Complaint  Patient presents with  . Diarrhea  . Abdominal Pain     HPI Per EMS: pt was on the toilet and had approx 6 episodes of dark diarrhea. Started to feel dizzy, nauseous, SOB and experience substernal CP. Now c/o RUQ abdominal pain. BP 90/60, received 500 cc bolus, SOB resolved with 2 L [place]  Past Medical History  Diagnosis Date  . Colitis   . GERD (gastroesophageal reflux disease)   . Diverticulitis     H/O  . Schizophrenia, schizo-affective   . Cataract   . Hyperlipidemia   . Wears glasses   . Wears dentures     upper  . Hypertension     uncontrolled with medication noncompliance  . Hepatitis C   . HIT (heparin-induced thrombocytopenia)   . Continuous chronic alcoholism   . H/O cocaine abuse   . Stroke 01/2012    Small cerebellar infarcts right greater than left as well as questionable acute left external capsule and caudate nuclear punctate lacunar infarcts noted per MRI (01/2012) - presumed to be embolic likely source PFO with right to left shunt (noted per TEE 01/ 2014)  . Chronic combined systolic and diastolic CHF (congestive heart failure) 02/29/2012    LV EF 40-45% per 2D echo (02/2012) with grade 1 diastolic dysfunction. Presumed to be 2/2 NICM in setting of dilated cardiomyopathy due to alcohol abuse  . Degenerative lumbar spinal stenosis     s/p L2-3, L3-4, L4-5 laminectomy partial facetectomy, and bilateral foraminotomy  . History of pancreatitis 01/2011    Admission for acute pancreatitis presumed 2/2 ongoing alcohol abuse and hypertriglyceridemia  . PFO (patent foramen ovale) 01/2012    with right to left shunt, noted per TEE in evaluation for source of embolic stroke in [id number]  . CKD (chronic kidney disease) stage 2, GFR 60-89 ml/min     BL SCr approximately 1-1.3  . Rhabdomyolysis 02/22/2012    idiopathic, cause never  identified    Past Surgical History  Procedure Laterality Date  . L2-3, l3-4, l4-5 laminectomy, partial facetectomy    . Left achilles  2007  . Tonsillectomy    . Knee arthroscopy    . I&d extremity  03/20/2011    Procedure: IRRIGATION AND DEBRIDEMENT EXTREMITY;  Surgeon: Kerin Salen, MD;  Location: [facility];  Service: Orthopedics;  Laterality: Left;  I&D LEFT ACHILLIES TENDON  . Eye surgery      right eye  . Resection distal clavical  09/17/2011    Procedure: RESECTION DISTAL CLAVICAL;  Surgeon: Nita Sells, MD;  Location: [facility];  Service: Orthopedics;  Laterality: Right;  right shoulder arthroscopy with sad and open distal clavicle excision     Family History  Problem Relation Age of Onset  . CAD Mother 76  . CAD Sister   . CAD Brother 22    died from MI at age 76yo  . Hypertension      History  Substance Use Topics  . Smoking status: Former Smoker -- 0.50 packs/day for 30 years    Types: Cigarettes    Quit date: 06/24/2001  . Smokeless tobacco: Not on file  . Alcohol Use: Yes     Comment: frequently-- 1/5th a day, also drinks about a bottle of wine daily       Review  of Systems  Unable to perform ROS: Unstable vital signs    Allergies  Thorazine  Home Medications   Current Outpatient Rx  Name  Route  Sig  Dispense  Refill  . carvedilol (COREG) 6.25 MG tablet   Oral   Take 6.25 mg by mouth 2 (two) times daily with a meal.         . cephALEXin (KEFLEX) 500 MG capsule   Oral   Take 1 capsule (500 mg total) by mouth 3 (three) times daily.   21 capsule   0   . cloNIDine (CATAPRES) 0.2 MG tablet   Oral   Take 1 tablet (0.2 mg total) by mouth 2 (two) times daily.   60 tablet   1   . folic acid (FOLVITE) 1 MG tablet   Oral   Take 1 tablet (1 mg total) by mouth daily.   30 tablet   1   . furosemide (LASIX) 40 MG tablet   Oral   Take 2 tablets (80 mg total) by mouth 2 (two) times daily.   120 tablet   3   .  isosorbide-hydrALAZINE (BIDIL) 20-37.5 MG per tablet   Oral   Take 2 tablets by mouth 3 (three) times daily.   90 tablet   1   . labetalol (NORMODYNE) 100 MG tablet   Oral   Take 1 tablet (100 mg total) by mouth 2 (two) times daily.   60 tablet   2   . lisinopril (PRINIVIL,ZESTRIL) 20 MG tablet   Oral   Take 10-20 mg by mouth 2 (two) times daily. Take 20mg  in AM and 10mg  in PM         . ondansetron (ZOFRAN) 4 MG tablet   Oral   Take 1 tablet (4 mg total) by mouth every 8 (eight) hours as needed for nausea.   20 tablet   0   . potassium chloride (K-DUR,KLOR-CON) 10 MEQ tablet   Oral   Take 1 tablet (10 mEq total) by mouth 2 (two) times daily.   30 tablet   1   . rosuvastatin (CRESTOR) 20 MG tablet   Oral   Take 1 tablet (20 mg total) by mouth daily.   30 tablet   3   . traZODone (DESYREL) 100 MG tablet   Oral   Take 100 mg by mouth at bedtime.           BP 140/80  Pulse 96  Temp(Src) 98.6 F (37 C) (Oral)  Resp 21  SpO2 100%  Physical Exam  Nursing note and vitals reviewed. Constitutional: He is oriented to person, place, and time. He appears well-developed and well-nourished. No distress.  HENT:  Head: Normocephalic and atraumatic.  Eyes: Pupils are equal, round, and reactive to light.  Neck: Normal range of motion.  Cardiovascular: Normal rate and intact distal pulses.   Pulmonary/Chest: No respiratory distress.  Abdominal: Normal appearance. He exhibits no distension. There is tenderness (mild). There is no rebound and no guarding.  Gross blood on rectal exam  Musculoskeletal: Normal range of motion.  Neurological: He is alert and oriented to person, place, and time. No cranial nerve deficit.  Skin: Skin is warm and dry. No rash noted.  Psychiatric: He has a normal mood and affect. His behavior is normal.    ED Course  Procedures (including critical care time)  Labs Reviewed  CBC WITH DIFFERENTIAL - Abnormal; Notable for the following:    RBC  2.44 (*)  Hemoglobin 7.1 (*)    HCT 21.0 (*)    Platelets 112 (*)    All other components within normal limits  COMPREHENSIVE METABOLIC PANEL - Abnormal; Notable for the following:    Glucose, Bld 108 (*)    BUN 57 (*)    Calcium 8.3 (*)    Total Protein 5.1 (*)    Albumin 2.8 (*)    GFR calc non Af Amer 60 (*)    GFR calc Af Amer 69 (*)    All other components within normal limits  LIPASE, BLOOD - Abnormal; Notable for the following:    Lipase 61 (*)    All other components within normal limits  PROTIME-INR - Abnormal; Notable for the following:    Prothrombin Time 16.2 (*)    All other components within normal limits  URINALYSIS, ROUTINE W REFLEX MICROSCOPIC  TROPONIN I  LACTIC ACID, PLASMA  POCT I-STAT TROPONIN I  TYPE AND SCREEN  PREPARE RBC (CROSSMATCH)   No results found.   1. CKD (chronic kidney disease) stage 2, GFR 60-89 ml/min   2. Acute lower GI bleeding       MDM  CRITICAL CARE Performed by: Leonard Schwartz L Total critical care time: 45 min  Critical care time was exclusive of separately billable procedures and treating other patients. Critical care was necessary to treat or prevent imminent or life-threatening deterioration. Critical care was time spent personally by me on the following activities: development of treatment plan with patient and/or surrogate as well as nursing, discussions with consultants, evaluation of patient's response to treatment, examination of patient, obtaining history from patient or surrogate, ordering and performing treatments and interventions, ordering and review of laboratory studies, ordering and review of radiographic studies, pulse oximetry and re-evaluation of patient's condition.         Dot Lanes, MD 06/24/12 2329

## 2012-06-24 NOTE — H&P (Signed)
Date: 06/25/2012               Patient Name:  [name] [name] MRN: [id number]  DOB: 02-Aug-1955 Age / Sex: 57 y.o., male   PCP: Dominic Pea, DO              Medical Service: Internal Medicine Teaching Service              Attending Physician: Dr. Eppie Gibson    First Contact: Dr. Algis Liming Pager: [phone]  Second Contact: Dr. Blaine Hamper Pager: [phone]            After Hours (After 5p/  First Contact Pager: [phone]  weekends / holidays): Second Contact Pager: [phone]    Chief Complaint: Rectal bleeding  History of Present Illness: Patient is a 57 y.o. male with a PMHx of continuous chronic alcohol abuse, hepatitis C (untreated), CKD1-2 (BL SCr 1-1.3), chronic combined systolic and diastolic CHF (LVEF A999333 with grade 1 diastolic dysfunction), and schizophrenia, who presents to [facility] for evaluation of rectal bleeding. The patient first experienced progressively worsening generalized weakness and positional lightheadedness on the morning of admission. He attempted to pass stools, and noted gross dark red blood in the toilet bowl that was not mixed with stools and without notable clots. He had approximately 6-7 episodes of the same bloody stools prior to ER evaluation. Per record review, it seems that these symptoms were preceded by LLQ and mid abdominal pain x approximately 2 weeks (for which he has been evaluated twice in the ED 5/21 and 5/26). This was thought likely secondary to acute on presumed chronic pancreatitis exacerbated by ongoing alcohol abuse (lipase 76 and ETOH level > 300 on 5/26). Abd pain symptoms have since been persistent but not worse since the time of onset.  The patient has not previously had an EGD or colonoscopy. He does not indicate use of NSAIDs including aspiring, ibuprofen, Aleve, Goody powder. He has never previously experienced similar symptoms. He has abstained from alcohol x 4 days (with minimal withdrawal symptoms) - although has protracted history of severe alcohol  abuse with multiple 5ths of liquor daily.   Of note, during the ED course, the pt was noted to have a hemoglobin of 7.1 from prior hemoglobin of 14.8 on 5/26. He has received 1L NS and is to receive 2 units of pRBC.   Review of Systems: Constitutional:  admits to chills, diaphoresis, fatigue, decreased appetite.  HEENT: admits to chronic blurry vision. Denies eye pain or discharge, ear pain or discharge, hearing loss, nasal congestion, sore throat.  Respiratory: admits to chest tightness, SOB. Denies cough and wheezing.  Cardiovascular: admits to chest pain. Denies palpitations and leg swelling.  Gastrointestinal: admits to nausea, vomiting, abdominal pain, blood in stool. Denies diarrhea, constipation.  Genitourinary: denies dysuria, urgency, frequency, hematuria, flank pain.  Musculoskeletal: admits to chronic back pain unchanged from baseline. Denies new arthralgias, back pain, joint swelling.  Skin: admits to itching. Denies rash and wound.  Neurological: admits to positional lightheadedness, generalized weakness. Denies seizures, syncope, numbness and headaches.   Hematological: denies easy bruising, personal or family bleeding history.  Psychiatric: admits to depression, anxiety. Denies suicidal / homicidal ideation.    Current Outpatient Medications: Medication Sig  . carvedilol (COREG) 6.25 MG tablet Take 6.25 mg by mouth 2 (two) times daily with a meal.  . cephALEXin (KEFLEX) 500 MG capsule Take 1 capsule (500 mg total) by mouth 3 (three) times daily.  . cloNIDine (CATAPRES) 0.2 MG tablet Take  1 tablet (0.2 mg total) by mouth 2 (two) times daily.  . folic acid (FOLVITE) 1 MG tablet Take 1 tablet (1 mg total) by mouth daily.  . furosemide (LASIX) 40 MG tablet Take 2 tablets (80 mg total) by mouth 2 (two) times daily.  . isosorbide-hydrALAZINE (BIDIL) 20-37.5 MG per tablet Take 2 tablets by mouth 3 (three) times daily.  Marland Kitchen labetalol (NORMODYNE) 100 MG tablet Take 1 tablet  (100 mg total) by mouth 2 (two) times daily.  Marland Kitchen lisinopril (PRINIVIL,ZESTRIL) 20 MG tablet Take 10-20 mg by mouth 2 (two) times daily. Take 20mg  in AM and 10mg  in PM  . ondansetron (ZOFRAN) 4 MG tablet Take 1 tablet (4 mg total) by mouth every 8 (eight) hours as needed for nausea.  . potassium chloride (K-DUR,KLOR-CON) 10 MEQ tablet Take 1 tablet (10 mEq total) by mouth 2 (two) times daily.  . rosuvastatin (CRESTOR) 20 MG tablet Take 1 tablet (20 mg total) by mouth daily.  . traZODone (DESYREL) 100 MG tablet Take 100 mg by mouth at bedtime.    Allergies: Allergies  Allergen Reactions  . Thorazine (Chlorpromazine Hcl) Other (See Comments)    Body freezes up     Past Medical History: Past Medical History  Diagnosis Date  . GERD (gastroesophageal reflux disease)   . Schizophrenia, schizo-affective   . Cataract   . Hyperlipidemia   . Wears glasses   . Wears dentures     upper  . Hypertension     uncontrolled with medication noncompliance  . Hepatitis C   . HIT (heparin-induced thrombocytopenia)   . Continuous chronic alcoholism   . H/O cocaine abuse   . Stroke 01/2012    Small cerebellar infarcts right greater than left as well as questionable acute left external capsule and caudate nuclear punctate lacunar infarcts noted per MRI (01/2012) - presumed to be embolic likely source PFO with right to left shunt (noted per TEE 01/ 2014)  . Chronic combined systolic and diastolic CHF (congestive heart failure) 02/29/2012    LV EF 40-45% per 2D echo (02/2012) with grade 1 diastolic dysfunction. Presumed to be 2/2 NICM in setting of dilated cardiomyopathy due to alcohol abuse  . Degenerative lumbar spinal stenosis     s/p L2-3, L3-4, L4-5 laminectomy partial facetectomy, and bilateral foraminotomy  . History of pancreatitis 01/2011    Admission for acute pancreatitis presumed 2/2 ongoing alcohol abuse and hypertriglyceridemia  . PFO (patent foramen ovale) 01/2012    with right to left shunt,  noted per TEE in evaluation for source of embolic stroke in [id number]  . CKD (chronic kidney disease) stage 2, GFR 60-89 ml/min     BL SCr approximately 1-1.3  . 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 Hepatic steatosis     suspected 2/2 alcohol abuse  . Splenic cyst   . Colitis 05/2009    History of colitis of ascending colon noted on CT abd/pelvis (05/2009), with interval resolution with subsequent CT    Past Surgical History: Past Surgical History  Procedure Laterality Date  . L2-3, l3-4, l4-5 laminectomy, partial facetectomy    . Left achilles  2007  . Tonsillectomy    . Knee arthroscopy    . I&d extremity  03/20/2011    Procedure: IRRIGATION AND DEBRIDEMENT EXTREMITY;  Surgeon: Kerin Salen, MD;  Location: [facility];  Service: Orthopedics;  Laterality: Left;  I&D LEFT ACHILLIES TENDON  . Eye surgery  right eye  . Resection distal clavical  09/17/2011    Procedure: RESECTION DISTAL CLAVICAL;  Surgeon: Nita Sells, MD;  Location: [facility];  Service: Orthopedics;  Laterality: Right;  right shoulder arthroscopy with sad and open distal clavicle excision     Family History: Family History  Problem Relation Age of Onset  . CAD Mother 13  . CAD Sister   . CAD Brother 70    died from MI at age 22yo  . Hypertension      Social History: History   Social History  . Marital Status: Single    Spouse Name: N/A    Number of Children: N/A  . Years of Education: 12th grade   Occupational History  . Disability     2/2 schizophrenia   Social History Main Topics  . Smoking status: Former Smoker -- 0.50 packs/day for 30 years    Types: Cigarettes    Quit date: 06/24/2001  . Smokeless tobacco: Not on file  . Alcohol Use: Yes     Comment: frequently-- 1/5th a day, also drinks about a bottle of wine daily   . Drug Use: No     Comment: history of cocaine abuse  . Sexually Active: Not on  file     Comment: 2-3 new male partners a year   Other Topics Concern  . Not on file   Social History Narrative   Lives in [place] alone, has a [facility] aide that helps with medications 4-5 days a week with medications, helping to clean.     Vital Signs: Blood pressure 100/59, pulse 94, temperature 99 F (37.2 C), temperature source Oral, resp. rate 17, SpO2 99.00%.  Physical Exam: General: Vital signs reviewed and noted. Well-developed, well-nourished, in no acute distress; alert, appropriate and cooperative throughout examination. Pale mucus membranes.  Head: Normocephalic, atraumatic.  Eyes: PERRL, EOMI, No signs jaundince.  Nose: Mucous membranes moist, not inflammed, nonerythematous.  Throat: Oropharynx nonerythematous, no exudate appreciated.  Neck: No deformities, masses, or tenderness noted. Supple, No carotid Bruits, no JVD.  Lungs:  Normal respiratory effort. Clear to auscultation BL without crackles or wheezes.  Heart: Tachycardia, RRR. S1 and S2 normal without gallop, murmur, or rubs.  Abdomen:  Traumatic scar across his mid abdomen. BS normoactive. Soft, Non-distended but tender to deep palpation of mid-epigastrium, LUQ and LLQ. No guarding or rebound tenderness. No CVA tenderness. No masses or organomegaly.  Extremities: No pretibial edema.  Neurologic: A&O X3, CN II - XII are grossly intact. Motor strength is 5/5 in the all 4 extremities, Sensations intact to light touch, Cerebellar signs negative.  Skin: No visible rashes, scars. There is an open wound on the right big toe nail base, nonbleeding, no erythema, or surrounding swelling. No signs of infection.   Rectal: Negative for external mass, lesions or tenderness, sphincter tone normal, there is black stools on examining finger.     Lab results:  CURRENT LABS: CBC:    Component Value Date/Time   WBC 7.2 06/24/2012 2056   HGB 7.1* 06/24/2012 2056   HCT 21.0* 06/24/2012 2056   PLT 112* 06/24/2012 2056   MCV 86.1  06/24/2012 2056   NEUTROABS 5.2 06/24/2012 2056   LYMPHSABS 1.3 06/24/2012 2056   MONOABS 0.7 06/24/2012 2056   EOSABS 0.1 06/24/2012 2056   BASOSABS 0.0 [id number]     Metabolic Panel:    Component Value Date/Time   NA 141 06/24/2012 2056   K 4.3 06/24/2012 2056  CL 111 06/24/2012 2056   CO2 22 06/24/2012 2056   BUN 57* 06/24/2012 2056   CREATININE 1.29 06/24/2012 2056   CREATININE 0.92 04/14/2012 1021   GLUCOSE 108* 06/24/2012 2056   CALCIUM 8.3* 06/24/2012 2056   AST 23 06/24/2012 2056   ALT 18 06/24/2012 2056   ALKPHOS 45 06/24/2012 2056   BILITOT 0.3 06/24/2012 2056   PROT 5.1* 06/24/2012 2056   ALBUMIN 2.8* 06/24/2012 2056     Urinalysis:  Recent Labs  06/24/12 2055  COLORURINE YELLOW  LABSPEC 1.021  PHURINE 5.5  GLUCOSEU NEGATIVE  HGBUR NEGATIVE  BILIRUBINUR NEGATIVE  KETONESUR NEGATIVE  PROTEINUR NEGATIVE  UROBILINOGEN 0.2  NITRITE NEGATIVE  LEUKOCYTESUR NEGATIVE     Drugs of Abuse  Pending    Troponin (Point of Care Test)  Recent Labs  06/24/12 2110  TROPIPOC 0.05    HISTORICAL LABS: Lab Results  Component Value Date   HGBA1C 5.8* 02/20/2012    Lab Results  Component Value Date   CHOL 245* 02/20/2012   HDL 55 02/20/2012   LDLCALC 121* 02/20/2012   TRIG 345* 02/20/2012   CHOLHDL 4.5 02/20/2012    Lab Results  Component Value Date   TSH 3.160 02/22/2012     Imaging results:  No results found.   Other results:  EKG (06/25/2012) - Normal Sinus Rhythm with tachycardia, normal axis, ST segments: normal.     Assessment & Plan:  Pt is a 57 y.o. yo male with a PMHx of continuous chronic alcohol abuse, hepatitis C (untreated), CKD1-2 (BL SCr 1-1.3), chronic combined systolic and diastolic CHF (LVEF A999333 with grade 1 diastolic dysfunction), and schizophrenia who was admitted on 06/24/2012 with GI bleeding and Hgb from 14.8 >>> 7.1 over 4 days.  Principal Problem:   Acute GI bleeding Active Problems:   HEPATITIS C   HYPERLIPIDEMIA    SCHIZOPHRENIA   SUBSTANCE ABUSE, MULTIPLE   PANCREATITIS, HX OF   HTN (hypertension)   Chronic combined systolic and diastolic CHF (congestive heart failure)   Thrombocytopenia   CKD (chronic kidney disease) stage 2, GFR 60-89 ml/min    1) GI bleeding with acute blood loss anemia and hypotension: Likely upper GI bleeding (source bleeding ulcer) with tarry stools. Variceal bleeding, esophagitis, Mallory-Weiss unlikely given the clinical context. Unclear whether he is taking aspirin which was prescribed after CVA in 02/2012. Initial blood pressure by EMS was 90/60, and he received 500cc of normal saline en route and additional 1L in ED and BP improved to 140/80. Plan  - Admit to step down unit  - Place 2 large bore IV lines - Monitor BP with a MAP goal of 65 - Check hemoglobin every 8 hours. Goal >8 with active bleeding   - IV Protonix 80 mg once, followed by Protonix infusion at 8mg /hr.  - continuous Oxygen therapy  - Packed red blood cell transfusion 2 units. - Hold carvedilol, clonidine, labetalol, Lasix, and Imdur. - ED consulted GI for evaluation - will need to follow up recommendations in the a.m.  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 EF with DD grade 1 of 35% 02/2012. Home regimen includes Lasix, Lipitor, Imdur, and carvedilol.  Plan  - Will hold those medications in the setting of hypotension due to problem #1.  - Cautious IVF rehydration in the setting of CHF  3) Chronic abdominal pain: Etiology  is unclear. Patient says that he has chronic pancreatitis and goes to pain clinic. He does not take chronic narcotics. There is demonstrable tenderness of the left side of his abdomen. Abdominal pelvis CT scan on 03/04/2011 revealed acute pancreatitis, no calcifications were reported. He has mildly elevated lipase level for a prolonged  duration. Lipase 61<<76 (06/20/2012). Possible he has chronic pancreatitis given his alcohol history.  Plan  - No clinical indication for abdominal imaging at this time - Pain management as needed.  4) Thrombocytopenia: Baseline platelet count is normal. On admission platelet count of 112. Likely secondary to alcohol abuse and acute blood loss.  5) Hepatitis C infection: This is untreated. This can be addressed as outpatient.  6) Alcohol abuse: Patient reports that has not been drinking over the last 1 week since presentation to the ED. He admits to heavy previous drinking. His alcohol blood level of 319 on 06/15/2012. AST/ALT ratio consistent with alcohol-induced hepatitis.  Plan. - UDS -CIWA protocol   7) Liver Cirrhosis: Secondary to alcohol abuse and hepatitis C. AST of 61, ALT of 23 alkaline phosphatase of 45, albumin of 2.8. INR is 1.3. Abdominal ultrasound scan 12/2009 showed hepatic steatosis and more recently in 01/2012 features of cirrhosis on abdomen ultrasound scan. No clinical signs of chronic liver disease. Current bleeding unlikely to be from esophageal variceal.   8)Schizophrenia: No acute psychosis. Patient reports that he goes to monitor mental health clinic. Currently not on any antipsychotic medications. However, he is on trazodone 100 mg at bedtime. Will continue to monitor for symptoms, and continue with as outpatient.  6) CKD 2: Stage 2 with BL creatine of 1-1.3. Currently stable. Will monitor.  7) Hypertension: Will hold his home medications in the setting of acute GI bleed.   DVT PPX - SCD's while in bed  CODE STATUS - Full  CONSULTS PLACED - GI  DISPO - Disposition is deferred at this time, awaiting stabilization and evaluation of his GI bleed.   Anticipated discharge in approximately 3-5 day(s).   The patient does have a current PCP Cletus Gash [place], [place] [phone]) and does need an [facility] hospital follow-up appointment after discharge.    Is the [facility] hospital  follow-up appointment a one-time only appointment? no.  Does the patient have transportation limitations that hinder transportation to clinic appointments? unknown.   Signed: Jessee Avers, MD PGY-1, Internal Medicine Resident Pager: [phone] (7PM-7AM) 06/25/2012, 12:19 AM

## 2012-06-25 ENCOUNTER — Encounter (HOSPITAL_COMMUNITY)
Admission: EM | Disposition: A | Discharge status: Home / Self Care | Payer: Self-pay | Source: Home / Self Care | Attending: Internal Medicine

## 2012-06-25 ENCOUNTER — Encounter (HOSPITAL_COMMUNITY): Payer: Self-pay | Admitting: Internal Medicine

## 2012-06-25 DIAGNOSIS — K254 Chronic or unspecified gastric ulcer with hemorrhage: Secondary | ICD-10-CM

## 2012-06-25 DIAGNOSIS — F1020 Alcohol dependence, uncomplicated: Secondary | ICD-10-CM

## 2012-06-25 DIAGNOSIS — K250 Acute gastric ulcer with hemorrhage: Secondary | ICD-10-CM

## 2012-06-25 DIAGNOSIS — K7460 Unspecified cirrhosis of liver: Secondary | ICD-10-CM

## 2012-06-25 DIAGNOSIS — K922 Gastrointestinal hemorrhage, unspecified: Secondary | ICD-10-CM

## 2012-06-25 DIAGNOSIS — I509 Heart failure, unspecified: Secondary | ICD-10-CM

## 2012-06-25 DIAGNOSIS — N182 Chronic kidney disease, stage 2 (mild): Secondary | ICD-10-CM

## 2012-06-25 DIAGNOSIS — I5042 Chronic combined systolic (congestive) and diastolic (congestive) heart failure: Secondary | ICD-10-CM

## 2012-06-25 HISTORY — PX: ESOPHAGOGASTRODUODENOSCOPY: SHX5428

## 2012-06-25 LAB — CBC
HCT: 25.1 % — ABNORMAL LOW (ref 39.0–52.0)
HCT: 25.6 % — ABNORMAL LOW (ref 39.0–52.0)
HCT: 22.5 % — ABNORMAL LOW (ref 39.0–52.0)
Hemoglobin: 8.6 g/dL — ABNORMAL LOW (ref 13.0–17.0)
Hemoglobin: 8.6 g/dL — ABNORMAL LOW (ref 13.0–17.0)
Hemoglobin: 8.8 g/dL — ABNORMAL LOW (ref 13.0–17.0)
MCH: 29.4 pg (ref 26.0–34.0)
MCHC: 34.2 g/dL (ref 30.0–36.0)
MCV: 86.2 fL (ref 78.0–100.0)
Platelets: 102 10*3/uL — ABNORMAL LOW (ref 150–400)
RBC: 2.93 MIL/uL — ABNORMAL LOW (ref 4.22–5.81)
RBC: 2.97 MIL/uL — ABNORMAL LOW (ref 4.22–5.81)
RDW: 14.1 % (ref 11.5–15.5)
RDW: 14.6 % (ref 11.5–15.5)
RDW: 14.7 % (ref 11.5–15.5)
WBC: 6.4 10*3/uL (ref 4.0–10.5)
WBC: 6.4 10*3/uL (ref 4.0–10.5)
WBC: 7.5 10*3/uL (ref 4.0–10.5)
WBC: 6.5 10*3/uL (ref 4.0–10.5)

## 2012-06-25 LAB — MAGNESIUM: Magnesium: 1.7 mg/dL (ref 1.5–2.5)

## 2012-06-25 LAB — COMPREHENSIVE METABOLIC PANEL
AST: 20 U/L (ref 0–37)
Albumin: 2.9 g/dL — ABNORMAL LOW (ref 3.5–5.2)
Calcium: 8.8 mg/dL (ref 8.4–10.5)
Creatinine, Ser: 0.98 mg/dL (ref 0.50–1.35)
GFR calc non Af Amer: 89 mL/min — ABNORMAL LOW (ref >90)

## 2012-06-25 LAB — MRSA PCR SCREENING: MRSA by PCR: NEGATIVE

## 2012-06-25 LAB — TROPONIN I: Troponin I: <0.3 ng/mL (ref <0.30)

## 2012-06-25 LAB — GLUCOSE, CAPILLARY
Glucose-Capillary: 65 mg/dL — ABNORMAL LOW (ref 70–99)
Glucose-Capillary: 102 mg/dL — ABNORMAL HIGH (ref 70–99)
Glucose-Capillary: 72 mg/dL (ref 70–99)

## 2012-06-25 SURGERY — EGD (ESOPHAGOGASTRODUODENOSCOPY)
Anesthesia: Moderate Sedation

## 2012-06-25 MED ORDER — DIPHENHYDRAMINE HCL 50 MG/ML IJ SOLN
INTRAMUSCULAR | Status: DC | PRN
  Administered 2012-06-25: 25 mg via INTRAVENOUS

## 2012-06-25 MED ORDER — ONDANSETRON HCL 4 MG/2ML IJ SOLN: 4.0000 mg | Freq: Four times a day (QID) | INTRAMUSCULAR | Status: DC | PRN

## 2012-06-25 MED ORDER — MIDAZOLAM HCL 5 MG/ML IJ SOLN
INTRAMUSCULAR | Status: AC
  Filled 2012-06-25: qty 2

## 2012-06-25 MED ORDER — FENTANYL CITRATE 0.05 MG/ML IJ SOLN
INTRAMUSCULAR | Status: AC
  Filled 2012-06-25: qty 4

## 2012-06-25 MED ORDER — SODIUM CHLORIDE 0.9 % IJ SOLN
3.0000 mL | Freq: Two times a day (BID) | INTRAMUSCULAR | Status: DC
  Administered 2012-06-25 – 2012-06-26 (×2): 3 mL via INTRAVENOUS

## 2012-06-25 MED ORDER — SODIUM CHLORIDE 0.9 % IV SOLN
80.0000 mg | Freq: Once | INTRAVENOUS | Status: AC
  Administered 2012-06-25: 80 mg via INTRAVENOUS
  Filled 2012-06-25: qty 80

## 2012-06-25 MED ORDER — PANTOPRAZOLE SODIUM 40 MG IV SOLR: 40.0000 mg | Freq: Two times a day (BID) | INTRAVENOUS | Status: DC

## 2012-06-25 MED ORDER — LORAZEPAM 1 MG PO TABS: 1.0000 mg | ORAL_TABLET | ORAL | Status: DC | PRN

## 2012-06-25 MED ORDER — FOLIC ACID 5 MG/ML IJ SOLN
1.0000 mg | Freq: Every day | INTRAMUSCULAR | Status: DC
  Administered 2012-06-25: 1 mg via INTRAVENOUS
  Filled 2012-06-25 (×4): qty 0.2

## 2012-06-25 MED ORDER — FENTANYL CITRATE 0.05 MG/ML IJ SOLN
INTRAMUSCULAR | Status: DC | PRN
  Administered 2012-06-25 (×4): 25 ug via INTRAVENOUS

## 2012-06-25 MED ORDER — DEXTROSE 5 % IV SOLN
INTRAVENOUS | Status: DC
  Administered 2012-06-25: 10:00:00 via INTRAVENOUS

## 2012-06-25 MED ORDER — SODIUM CHLORIDE 0.9 % IJ SOLN
PREFILLED_SYRINGE | INTRAMUSCULAR | Status: DC | PRN
  Administered 2012-06-25: 11:00:00

## 2012-06-25 MED ORDER — PANTOPRAZOLE SODIUM 40 MG IV SOLR
8.0000 mg/h | INTRAVENOUS | Status: DC
  Administered 2012-06-25 – 2012-06-26 (×5): 8 mg/h via INTRAVENOUS
  Filled 2012-06-25 (×11): qty 80

## 2012-06-25 MED ORDER — MORPHINE SULFATE 2 MG/ML IJ SOLN
2.0000 mg | INTRAMUSCULAR | Status: DC | PRN
  Administered 2012-06-25 – 2012-06-27 (×9): 2 mg via INTRAVENOUS
  Filled 2012-06-25 (×9): qty 1

## 2012-06-25 MED ORDER — DIPHENHYDRAMINE HCL 50 MG/ML IJ SOLN
INTRAMUSCULAR | Status: AC
  Filled 2012-06-25: qty 1

## 2012-06-25 MED ORDER — LORAZEPAM 2 MG/ML IJ SOLN: 1.0000 mg | INTRAMUSCULAR | Status: DC | PRN

## 2012-06-25 MED ORDER — EPINEPHRINE HCL 0.1 MG/ML IJ SOSY
PREFILLED_SYRINGE | INTRAMUSCULAR | Status: AC
  Filled 2012-06-25: qty 10

## 2012-06-25 MED ORDER — THIAMINE HCL 100 MG/ML IJ SOLN
100.0000 mg | Freq: Every day | INTRAMUSCULAR | Status: DC
  Administered 2012-06-25 – 2012-06-26 (×2): 100 mg via INTRAVENOUS
  Filled 2012-06-25 (×3): qty 1

## 2012-06-25 MED ORDER — ADULT MULTIVITAMIN W/MINERALS CH
1.0000 | ORAL_TABLET | Freq: Every day | ORAL | Status: DC
  Administered 2012-06-25 – 2012-06-27 (×3): 1 via ORAL
  Filled 2012-06-25 (×3): qty 1

## 2012-06-25 MED ORDER — BUTAMBEN-TETRACAINE-BENZOCAINE 2-2-14 % EX AERO
INHALATION_SPRAY | CUTANEOUS | Status: DC | PRN
  Administered 2012-06-25: 2 via TOPICAL

## 2012-06-25 MED ORDER — MIDAZOLAM HCL 10 MG/2ML IJ SOLN
INTRAMUSCULAR | Status: DC | PRN
  Administered 2012-06-25 (×3): 2 mg via INTRAVENOUS
  Administered 2012-06-25: 1 mg via INTRAVENOUS
  Administered 2012-06-25: 2 mg via INTRAVENOUS

## 2012-06-25 NOTE — Consult Note (Signed)
I am covering this patient today for Drs. Adriana Mccallum.   Referring Provider: Medicine Teaching service Primary Care Physician:  Dominic Pea, DO Primary Gastroenterologist:  none.  Reason for Consultation:  GI Bleed  HPI: [name] [name] is a 57 y.o. male  admitted last evening with complaints of weakness and black stools. Patient tells me that his bleeding started yesterday he had several episodes of black stools at home prior to coming to the hospital . He has been having some upper and left-sided abdominal pain as well over the past few days. He has been nauseated but has not had any vomiting. He says he had dizziness at home but no syncope. He has not had any prior history of GI bleeding and no prior endoscopic intervention. Hemoglobin on admission was 7.1 hematocrit 21 platelets 112 an MCV of 86 BUN 57 creatinine 1.29 liver tests were normal. His receive 2 units of packed RBCs and hemoglobin is 8.6 this morning he has remained hemodynamically stable. he also has history of cocaine abuse which he says is in active. He has history of schizophrenia, hypertension, hepatitis C with cirrhosis secondary to EtOH and hepatitis C, history of CVA and congestive heart underwent EF of about 40-45% He is not anticoagulated and states that he was not taking any regular aspirin or NSAIDs at home. Last etoh drink about a week ago.  Usually drinks 3-4 bottles of wine per week as well as beer.   Past Medical History  Diagnosis Date  . GERD (gastroesophageal reflux disease)   . Schizophrenia, schizo-affective   . Cataract   . Hyperlipidemia   . Wears glasses   . Wears dentures     upper  . Hypertension     uncontrolled with medication noncompliance  . Hepatitis C   . HIT (heparin-induced thrombocytopenia)   . Continuous chronic alcoholism   . H/O cocaine abuse   . Stroke 01/2012    Small cerebellar infarcts right greater than left as well as questionable acute left external capsule and caudate  nuclear punctate lacunar infarcts noted per MRI (01/2012) - presumed to be embolic likely source PFO with right to left shunt (noted per TEE 01/ 2014)  . Chronic combined systolic and diastolic CHF (congestive heart failure) 02/29/2012    LV EF 40-45% per 2D echo (02/2012) with grade 1 diastolic dysfunction. 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 Degenerative lumbar spinal stenosis     s/p L2-3, L3-4, L4-5 laminectomy partial facetectomy, and bilateral foraminotomy  . History of pancreatitis 01/2011    Admission for acute pancreatitis presumed 2/2 ongoing alcohol abuse and hypertriglyceridemia  . PFO (patent foramen ovale) 01/2012    with right to left shunt, noted per TEE in evaluation for source of embolic stroke in [id number]  . CKD (chronic kidney disease) stage 2, GFR 60-89 ml/min     BL SCr approximately 1-1.3  . 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 Hepatic steatosis     suspected 2/2 alcohol abuse  . Splenic cyst   . Colitis 05/2009    History of colitis of ascending colon noted on CT abd/pelvis (05/2009), with interval resolution with subsequent CT  . Hypertriglyceridemia     Past Surgical History  Procedure Laterality Date  . L2-3, l3-4, l4-5 laminectomy, partial facetectomy    . Left achilles  2007  . Tonsillectomy    .  Knee arthroscopy    . I&d extremity  03/20/2011    Procedure: IRRIGATION AND DEBRIDEMENT EXTREMITY;  Surgeon: Kerin Salen, MD;  Location: [facility];  Service: Orthopedics;  Laterality: Left;  I&D LEFT ACHILLIES TENDON  . Eye surgery      right eye  . Resection distal clavical  09/17/2011    Procedure: RESECTION DISTAL CLAVICAL;  Surgeon: Nita Sells, MD;  Location: [place];  Service: Orthopedics;  Laterality: Right;  right shoulder arthroscopy with sad and open distal clavicle excision     Prior to  Admission medications   Medication Sig Start Date End Date Taking? Authorizing Provider  carvedilol (COREG) 6.25 MG tablet Take 6.25 mg by mouth 2 (two) times daily with a meal. 03/08/12  Yes Jaciel Diem J Angiulli, PA-C  cephALEXin (KEFLEX) 500 MG capsule Take 1 capsule (500 mg total) by mouth 3 (three) times daily. 06/16/12  Yes Peter S Dammen, PA-C  cloNIDine (CATAPRES) 0.2 MG tablet Take 1 tablet (0.2 mg total) by mouth 2 (two) times daily. 04/07/12  Yes Wynetta Emery, PA-C  folic acid (FOLVITE) 1 MG tablet Take 1 tablet (1 mg total) by mouth daily. 03/08/12  Yes Andriel Omalley J Angiulli, PA-C  furosemide (LASIX) 40 MG tablet Take 2 tablets (80 mg total) by mouth 2 (two) times daily. 05/26/12  Yes Shaune Pascal Bensimhon, MD  isosorbide-hydrALAZINE (BIDIL) 20-37.5 MG per tablet Take 2 tablets by mouth 3 (three) times daily. 04/07/12  Yes Wynetta Emery, PA-C  labetalol (NORMODYNE) 100 MG tablet Take 1 tablet (100 mg total) by mouth 2 (two) times daily. 06/06/12  Yes Alejandro Paya, DO  lisinopril (PRINIVIL,ZESTRIL) 20 MG tablet Take 10-20 mg by mouth 2 (two) times daily. Take 20mg  in AM and 10mg  in PM   Yes Historical Provider, MD  ondansetron (ZOFRAN) 4 MG tablet Take 1 tablet (4 mg total) by mouth every 8 (eight) hours as needed for nausea. 06/20/12  Yes Johnna Acosta, MD  potassium chloride (K-DUR,KLOR-CON) 10 MEQ tablet Take 1 tablet (10 mEq total) by mouth 2 (two) times daily. 04/07/12  Yes Wynetta Emery, PA-C  rosuvastatin (CRESTOR) 20 MG tablet Take 1 tablet (20 mg total) by mouth daily. 04/14/12  Yes Dominic Pea, DO  traZODone (DESYREL) 100 MG tablet Take 100 mg by mouth at bedtime. 03/21/12  Yes Ansel Bong, MD    Current Facility-Administered Medications  Medication Dose Route Frequency Provider Last Rate Last Dose  . folic acid injection 1 mg  1 mg Intravenous Daily Maitri S Kalia-Reynolds, DO      . LORazepam (ATIVAN) tablet 1 mg  1 mg Oral Q4H PRN Maitri S Kalia-Reynolds, DO       Or  . LORazepam  (ATIVAN) injection 1 mg  1 mg Intravenous Q4H PRN Maitri S Kalia-Reynolds, DO      . morphine 2 MG/ML injection 2 mg  2 mg Intravenous Q4H PRN Maitri S Kalia-Reynolds, DO   2 mg at 06/25/12 0058  . multivitamin with minerals tablet 1 tablet  1 tablet Oral Daily Maitri S Kalia-Reynolds, DO      . ondansetron (ZOFRAN) injection 4 mg  4 mg Intravenous Q6H PRN Maitri S Kalia-Reynolds, DO      . pantoprazole (PROTONIX) 80 mg in sodium chloride 0.9 % 250 mL infusion  8 mg/hr Intravenous Continuous Jessee Avers, MD 25 mL/hr at 06/25/12 0211 8 mg/hr at 06/25/12 0211  . sodium chloride 0.9 % injection 3 mL  3  mL Intravenous Q12H Maitri S Kalia-Reynolds, DO      . thiamine (B-1) injection 100 mg  100 mg Intravenous Daily Maitri S Kalia-Reynolds, DO        Allergies as of 06/24/2012 - Review Complete 06/24/2012  Allergen Reaction Noted  . Thorazine (chlorpromazine hcl) Other (See Comments)     Family History  Problem Relation Age of Onset  . CAD Mother 60  . CAD Sister   . CAD Brother 31    died from MI at age 32yo  . Hypertension      History   Social History  . Marital Status: Single    Spouse Name: N/A    Number of Children: N/A  . Years of Education: 12th grade   Occupational History  . Disability     2/2 schizophrenia   Social History Main Topics  . Smoking status: Former Smoker -- 0.50 packs/day for 30 years    Types: Cigarettes    Quit date: 06/24/2001  . Smokeless tobacco: Not on file  . Alcohol Use: No     Comment: frequently-- 1/5th a day, also drinks about a bottle of wine daily   . Drug Use: No     Comment: history of cocaine abuse  . Sexually Active: Not on file     Comment: 2-3 new male partners a year   Other Topics Concern  . Not on file   Social History Narrative   Lives in [place] alone, has a [facility] aide that helps with medications 4-5 days a week with medications, helping to clean.    Review of Systems: Pertinent positive and negative review of  systems were noted in the above HPI section.  All other review of systems was otherwise negative.  Physical Exam: Vital signs in last 24 hours: Temp:  [98.1 F (36.7 C)-99 F (37.2 C)] 98.3 F (36.8 C) (05/31 0731) Pulse Rate:  [69-109] 69 (05/31 0700) Resp:  [13-32] 18 (05/31 0700) BP: (62-155)/(38-91) 146/70 mmHg (05/31 0700) SpO2:  [98 %-100 %] 100 % (05/31 0700) Weight:  [231 lb 0.7 oz (104.8 kg)] 231 lb 0.7 oz (104.8 kg) (05/31 0049) Last BM Date: 06/25/12 General:   Alert,  Well-developed, well-nourished,AA male  pleasant and cooperative in NAD Head:  Normocephalic and atraumatic. Eyes:  Sclera clear, no icterus.   Conjunctiva pink. Ears:  Normal auditory acuity. Nose:  No deformity, discharge,  or lesions. Mouth:  No deformity or lesions.   Neck:  Supple; no masses or thyromegaly. Lungs:  Clear throughout to auscultation.   No wheezes, crackles, or rhonchi. Heart:  Regular rate and rhythm; no murmurs, clicks, rubs,  or gallops. Abdomen:  Soft,tender epigastrium and LUQ/LMQ, BS active,nonpalp mass or hsm.   Rectal:  Deferred- rectal in ER with melena Msk:  Symmetrical without gross deformities. . Pulses:  Normal pulses noted. Extremities:  Without clubbing or edema. Neurologic:  Alert and  oriented x4;  grossly normal neurologically. Skin:  Intact without significant lesions or rashes.. Psych:  Alert and cooperative. Normal mood and affect.  Intake/Output from previous day: 05/30 0701 - 05/31 0700 In: 1162.5 [P.O.:240; I.V.:160; Blood:762.5] Out: 1300 [Urine:1300] Intake/Output this shift:    Lab Results:  Recent Labs  06/24/12 2056 06/25/12 0647  WBC 7.2 6.4  HGB 7.1* 8.6*  HCT 21.0* 25.1*  PLT 112* 110*   BMET  Recent Labs  06/24/12 2056  NA 141  K 4.3  CL 111  CO2 22  GLUCOSE 108*  BUN 57*  CREATININE 1.29  CALCIUM 8.3*   LFT  Recent Labs  06/24/12 2056  PROT 5.1*  ALBUMIN 2.8*  AST 23  ALT 18  ALKPHOS 45  BILITOT 0.3    PT/INR  Recent Labs  06/24/12 2149  LABPROT 16.2*  INR 1.33    Studies/Results:    Upper abdominal ultrasound in January 2014 showed common bile duct of 11 mm normal gallbladder cirrhotic liver and pancreas was not well visualized  IMPRESSION:  #5 57 year old male with acute upper GI bleed presenting with melena and significant normocytic anemia. This is in the setting of known cirrhosis. Etiologies include variceal bleed portal gastropathy, acute esophagitis or ulcer disease. #2 normocytic anemia secondary to above #3 thrombocytopenia #4 hepatic cirrhosis secondary to EtOH and hepatitis C  #5 history of EtOH abuse patient states inactive currently #6 HX cocaine abuse #7 schizophrenia/schizoaffective disorder #8 hypertension #9 strip CVA #10 history of CHF  Plan; Serial hemoglobins and keep hemoglobin in the 8 range Scheduled for upper endoscopy with Dr. Ardis Hughs this morning, procedure discussed in detail with the patient and he is agreeable to proceed. Continue Protonix infusion No previous ultrasound January 2014 showed common bile duct of 11 mm-question underlying biliary stricture secondary to recurrent/chronic pancreatitis. LFTs are currently normal. Consider further evaluation once acute bleed has stabilized   Amy Esterwood  06/25/2012, 8:03 AM   ________________________________________________________________________  Velora Heckler GI MD note (I am covering this patient today for Drs. Adriana Mccallum):  I personally examined the patient, reviewed the data and agree with the assessment and plan described above.  Melena in cirrhotic, alcoholic drinking up until one week ago.   Planning on EGD later today.   Owens Loffler, MD [facility] Gastroenterology Pager [phone]

## 2012-06-25 NOTE — H&P (Signed)
Internal Medicine Attending Admission Note Date: 06/25/2012  Patient name: [name] [name] Medical record number: [id number] Date of birth: 1955-05-09 Age: 57 y.o. Gender: male  I saw and evaluated the patient. I reviewed the resident's note and I agree with the resident's findings and plan as documented in the resident's note.  Chief Complaint(s): Melanic stools x1 day.  History - key components related to admission:  [name] [name] is a 57 year old man with a history of alcohol abuse, hepatitis C, schizophrenia, pancreatitis, and combined systolic and diastolic heart failure who presents with the acute onset of melanic stools x1 day. This has been associated with orthostatic dizziness without syncope but with left upper quadrant pain. He has never had previous GI bleeding and denies aspirin, goody powder, or over-the-counter nonsteroidal anti-inflammatories. He had several melanic stools and therefore called EMS where he was found to have a blood pressure of 90/60. He was brought to the emergency department where he was found to have a hemoglobin of 7.1 down from 14.8 just 4 days prior. He was given intravenous fluids and 2 units of packed red blood cells. This morning he underwent an esophagogastroduodenoscopy evaluation which showed multiple gastric ulcers and pan-gastritis. In one of the ulcers there was a visible vessel which was injected with epinephrine and clipped. Multiple biopsies were taken. Post procedure he is sedated but arousable and without any current complaints.  Physical Exam - key components related to admission:  Filed Vitals:   06/25/12 1101 06/25/12 1115 06/25/12 1125 06/25/12 1151  BP:  151/72 141/84   Pulse:      Temp: 98.2 F (36.8 C)   97.5 F (36.4 C)  TempSrc: Oral   Oral  Resp:  16    Height:      Weight:      SpO2:  95% 100%    Gen.: Well-developed, well-nourished, man lying comfortably in bed somnolent but arousable. He is in no acute distress. Lungs: Clear  to auscultation bilaterally without wheezes, rhonchi, or rales. Heart: Regular rate and rhythm without murmurs, rubs, or gallops. Abdomen: Soft, nontender, active bowel sounds. Extremities: Without edema, missing right big toe nail with nail bed changes consistent with recent avulsion. No drainage.  Lab results:  Basic Metabolic Panel:  Recent Labs  06/24/12 2056 06/25/12 0648  NA 141 143  K 4.3 4.2  CL 111 114*  CO2 22 19  GLUCOSE 108* 96  BUN 57* 52*  CREATININE 1.29 0.98  CALCIUM 8.3* 8.8  MG  --  1.7   Liver Function Tests:  Recent Labs  06/24/12 2056 06/25/12 0648  AST 23 20  ALT 18 18  ALKPHOS 45 42  BILITOT 0.3 0.6  PROT 5.1* 5.5*  ALBUMIN 2.8* 2.9*    Recent Labs  06/24/12 2056  LIPASE 61*   CBC:  Recent Labs  06/24/12 2056 06/25/12 0647 06/25/12 0850  WBC 7.2 6.4 6.4  NEUTROABS 5.2  --   --   HGB 7.1* 8.6* 8.6*  HCT 21.0* 25.1* 25.2*  MCV 86.1 86.0 86.0  PLT 112* 110* 105*   Cardiac Enzymes:  Recent Labs  06/25/12 0850  TROPONINI <0.30   CBG:  Recent Labs  06/25/12 0732 06/25/12 1005 06/25/12 1154  GLUCAP 73 65* 102*   Coagulation:  Recent Labs  06/24/12 2149  INR 1.33   Alcohol Level:  Recent Labs  06/25/12 0648  ETH <11   Urinalysis:  Unremarkable  Other results:  EKG: Normal sinus tachycardia at 110 beats per minute,  normal axis and intervals, no significant Q waves or LVH by voltage, no ST-T changes, unchanged from the previous ECG on 05/17/2012.  Assessment & Plan by Problem:  [name] [name] is a 57 year old man with a history of alcohol abuse, hepatitis C, schizophrenia, and systolic and diastolic heart failure who presents with melanic stools, left upper quadrant pain, and orthostatic dizziness. He was found to have a 7 g decrease in his hemoglobin over a period of 4 days. Upper GI leading was suspected and an EGD demonstrated multiple gastric ulcers and pan gastritis. One of the ulcers had a visible vessel  which was injected with epinephrine and clipped. He is currently hemodynamically stable and resting comfortably. The cause of the ulcers are unclear, but the pan gastritis is likely secondary to his alcohol abuse. Biopsies looking for H. pylori or tumor are pending.  1) Gastric ulcers and pan gastritis: We will monitor him in the step down unit overnight. He will be maintained on intravenous PPI for at least the next 24 hours. He will have 2 large-bore IVs in place at all times an active type and screen. We will follow serial hematocrits every 8 hours for the next 24 hours. We will also follow serial troponins to assure he has not had an occult infarction. We will followup on his biopsy results and treat appropriately. As mentioned by [name] [name] he will require a repeat EGD in 2 months to assure healing of the gastric ulcers.  2) Alcohol abuse: We will place [name] [name] on a CIWA protocol given our concerns for alcohol withdrawal.  3) Disposition: [name] [name] will be discharged home when he is found to have a stable hematocrit with no further evidence of bleeding over a 24-hour period.

## 2012-06-25 NOTE — Progress Notes (Signed)
Subjective:  Patient had EGD w/ biopsy and EGD w/ control of bleeding procedure today. Patient is little drowsy when I saw him, but he is alert and oriented. He does not have chest pain or abdominal pain. He seems to have tolerated the procedure well. He did not have BW after procedure. No fever or chills.  Objective: Vital signs in last 24 hours: Filed Vitals:   06/25/12 1101 06/25/12 1115 06/25/12 1125 06/25/12 1151  BP:  151/72 141/84   Pulse:      Temp: 98.2 F (36.8 C)   97.5 F (36.4 C)  TempSrc: Oral   Oral  Resp:  16    Height:      Weight:      SpO2:  95% 100%    Weight change:   Intake/Output Summary (Last 24 hours) at 06/25/12 1413 Last data filed at 06/25/12 1100  Gross per 24 hour  Intake 1605.5 ml  Output   1501 ml  Net  104.5 ml    Physical Exam:   Filed Vitals:   06/25/12 1101 06/25/12 1115 06/25/12 1125 06/25/12 1151  BP:  151/72 141/84   Pulse:      Temp: 98.2 F (36.8 C)   97.5 F (36.4 C)  TempSrc: Oral   Oral  Resp:  16    Height:      Weight:      SpO2:  95% 100%     General: Not in acute distress, little drowsy HEENT: PERRL, EOMI, no scleral icterus, No JVD or bruit Cardiac: S1/S2, RRR, No murmurs, gallops or rubs Pulm: Good air movement bilaterally. Clear to auscultation bilaterally. No rales, wheezing, rhonchi or rubs. Abd: Soft, nondistended, nontender, no rebound pain, no organomegaly, BS present Ext: No edema. 2+DP/PT pulse bilaterally. Missing right big toe nail  Musculoskeletal: No joint deformities, erythema, or stiffness. Skin: No rashes.  Neuro: Alert and oriented X3, cranial nerves II-XII grossly intact, muscle strength 5/5 in all extremeties, sensation to light touch intact. Brachial reflex 2+ bilaterally.    Lab Results: Basic Metabolic Panel:  Recent Labs Lab 06/24/12 2056 06/25/12 0648  NA 141 143  K 4.3 4.2  CL 111 114*  CO2 22 19  GLUCOSE 108* 96  BUN 57* 52*  CREATININE 1.29 0.98  CALCIUM 8.3* 8.8  MG  --   1.7   Liver Function Tests:  Recent Labs Lab 06/24/12 2056 06/25/12 0648  AST 23 20  ALT 18 18  ALKPHOS 45 42  BILITOT 0.3 0.6  PROT 5.1* 5.5*  ALBUMIN 2.8* 2.9*    Recent Labs Lab 06/20/12 1240 06/24/12 2056  LIPASE 76* 61*  AMYLASE 69  --    No results found for this basename: AMMONIA,  in the last 168 hours CBC:  Recent Labs Lab 06/20/12 1240 06/24/12 2056 06/25/12 0647 06/25/12 0850  WBC 7.1 7.2 6.4 6.4  NEUTROABS 5.1 5.2  --   --   HGB 14.8 7.1* 8.6* 8.6*  HCT 42.5 21.0* 25.1* 25.2*  MCV 85.9 86.1 86.0 86.0  PLT 176 112* 110* 105*   Cardiac Enzymes:  Recent Labs Lab 06/25/12 0850  TROPONINI <0.30   BNP: No results found for this basename: PROBNP,  in the last 168 hours D-Dimer: No results found for this basename: DDIMER,  in the last 168 hours CBG:  Recent Labs Lab 06/25/12 0732 06/25/12 1005 06/25/12 1154  GLUCAP 73 65* 102*   Hemoglobin A1C: No results found for this basename: HGBA1C,  in the last  168 hours Fasting Lipid Panel: No results found for this basename: CHOL, HDL, LDLCALC, TRIG, CHOLHDL, LDLDIRECT,  in the last 168 hours Thyroid Function Tests: No results found for this basename: TSH, T4TOTAL, FREET4, T3FREE, THYROIDAB,  in the last 168 hours Coagulation:  Recent Labs Lab 06/24/12 2149  LABPROT 16.2*  INR 1.33   Anemia Panel: No results found for this basename: VITAMINB12, FOLATE, FERRITIN, TIBC, IRON, RETICCTPCT,  in the last 168 hours Urine Drug Screen: Drugs of Abuse     Component Value Date/Time   LABOPIA PPS 04/14/2012 1015   LABOPIA NONE DETECTED 02/20/2012 0105   COCAINSCRNUR NEG 04/14/2012 1015   COCAINSCRNUR NONE DETECTED 02/20/2012 0105   LABBENZ NEG 04/14/2012 1015   LABBENZ NONE DETECTED 02/20/2012 0105   AMPHETMU NONE DETECTED 02/20/2012 0105   THCU NONE DETECTED 02/20/2012 0105   LABBARB PPS 04/14/2012 1015   LABBARB NONE DETECTED 02/20/2012 0105    Alcohol Level:  Recent Labs Lab 06/25/12 0648  ETH  <11   Urinalysis:  Recent Labs Lab 06/20/12 1559 06/24/12 2055  COLORURINE YELLOW YELLOW  LABSPEC 1.033* 1.021  PHURINE 5.0 5.5  GLUCOSEU NEGATIVE NEGATIVE  HGBUR NEGATIVE NEGATIVE  BILIRUBINUR SMALL* NEGATIVE  KETONESUR 15* NEGATIVE  PROTEINUR 100* NEGATIVE  UROBILINOGEN 1.0 0.2  NITRITE NEGATIVE NEGATIVE  LEUKOCYTESUR NEGATIVE NEGATIVE   Micro Results: Recent Results (from the past 240 hour(s))  MRSA PCR SCREENING     Status: None   Collection Time    06/25/12 12:37 AM      Result Value Range Status   MRSA by PCR NEGATIVE  NEGATIVE Final   Comment:            The GeneXpert MRSA Assay (FDA     approved for NASAL specimens     only), is one component of a     comprehensive MRSA colonization     surveillance program. It is not     intended to diagnose MRSA     infection nor to guide or     monitor treatment for     MRSA infections.   Studies/Results: No results found. Medications:  Scheduled Meds: . folic acid  1 mg Intravenous Daily  . multivitamin with minerals  1 tablet Oral Daily  . sodium chloride  3 mL Intravenous Q12H  . thiamine  100 mg Intravenous Daily   Continuous Infusions: . pantoprozole (PROTONIX) infusion 8 mg/hr (06/25/12 0211)   PRN Meds:.LORazepam, LORazepam, morphine injection, ondansetron Assessment/Plan:  1) Upper GI bleeding with acute blood loss anemia: patient received 2 u of blood and Hgb increased from 7.1 to 8.6. GI was consulted. EGD w/ biopsy and EGD w/ control of bleeding were performed by Dr. Ardis Hughs. Multiple ulcers in his stomach were found, with biggest one of 1.5 cm. It contained a clear  visible vessel within the crater. This was treated with epinephrine injection and endoclip placement. Biopsies were taken. Patient tolerated the procedure well. He is currently hemodynamically stable with blood pressure 141/84 mmHg and heart rate 67/min.   - Appreciate GI's consult in managing our patient.. - will follow GI's recommendation:  continue IV PPI gtts for another 24-36 hours. Will f/u biopsy result. If positive for H pylori, will treat with appropriate antibiotics. Will avoid NSAIDs. Will provide pt with a list of NSAIDs at discharge. Patient will need to have a repeat EGD in 2 months to confirm ulcers are healing. -Will continue cbc q8h for the next. -will follow serial troponins to assure he  has not had an occult infarction per Dr. Eppie Gibson. - Continue to hold carvedilol, clonidine, labetalol, Lasix, and Imdur, may restart tomorrow if hemodynamically stable. 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 EF with DD grade 1 of 35 to 40% 02/2012. Home regimen includes Lasix, Lipitor, Imdur, and carvedilol.   Plan   - Will hold those medications in the setting of problem #1.   - May resume his home med tomorrow if hemodynamically stable  3) Chronic abdominal pain: Etiology is unclear. Patient says that he has chronic pancreatitis and goes to pain clinic. He does not take chronic narcotics. There is demonstrable tenderness of the left side of his abdomen on admission. Abdominal pelvis CT scan on 03/04/2011 revealed acute pancreatitis, no calcifications were reported. He has mildly elevated lipase level for a prolonged duration. Lipase 61<--76 (06/20/2012). Possiblly he has chronic pancreatitis given his alcohol history. Currently he dose not have abdominal pain.  Plan   - No clinical indication for abdominal imaging at this time - clear liquid diet - Pain management as needed.  4) Thrombocytopenia: Baseline platelet count is normal. On admission platelet count of 112. Likely secondary to alcohol abuse and acute blood loss.  5) Hepatitis C infection: This is untreated. This can be addressed as outpatient.  6) Alcohol abuse: Patient reports that has not been drinking over  the last 1 week since presentation to the ED. He admits to heavy previous drinking. His alcohol blood level of 319 on 06/15/2012. AST/ALT ratio consistent with alcohol abuse Plan. -UDS -CIWA protocol   7) Liver Cirrhosis: Secondary to alcohol abuse and hepatitis C. AST of 61, ALT of 23 alkaline phosphatase of 45, albumin of 2.8. INR is 1.3. Abdominal ultrasound scan 12/2009 showed hepatic steatosis and more recently in 01/2012 features of cirrhosis on abdomen ultrasound scan. No clinical signs of chronic liver disease. Current bleeding is confirmed by EGD due to gastric ulcers. -will follow up closely.  8)Schizophrenia: No acute psychosis. Patient reports that he [facility]. Currently not on any antipsychotic medications. However, he is on trazodone 100 mg at bedtime. Will continue to monitor for symptoms, and continue with as outpatient.  6) CKD 2: Stage 2 with BL creatine of 1-1.3. Currently stable. Will monitor.  7) Hypertension: Will hold his home medications in the setting of acute GI bleed.     DVT PPX - SCD's while in bed  CODE STATUS - Full  CONSULTS PLACED - GI  DISPO - Disposition is deferred at this time, awaiting stabilization and evaluation of his GI bleed.    Anticipated discharge in approximately 3-5 day(s).    The patient does have a current PCP Cletus Gash [place], [place] [phone]) and does need an [facility] hospital follow-up appointment after discharge.     Is the [facility] hospital follow-up appointment a one-time only appointment? no.  Does the patient have transportation limitations that hinder transportation to clinic appointments? unknown.    LOS: 1 day   Ivor Costa 06/25/2012, 2:13 PM

## 2012-06-25 NOTE — Interval H&P Note (Signed)
History and Physical Interval Note:  06/25/2012 9:43 AM  [name] [name]  has presented today for surgery, with the diagnosis of GI Bleed  The various methods of treatment have been discussed with the patient and family. After consideration of risks, benefits and other options for treatment, the patient has consented to  Procedure(s): ESOPHAGOGASTRODUODENOSCOPY (EGD) (N/A) as a surgical intervention .  The patient's history has been reviewed, patient examined, no change in status, stable for surgery.  I have reviewed the patient's chart and labs.  Questions were answered to the patient's satisfaction.     Owens Loffler

## 2012-06-25 NOTE — Progress Notes (Signed)
Utilization Review Completed.   Ronne Savoia, RN, BSN Nurse Case Manager  [phone]  

## 2012-06-25 NOTE — H&P (View-Only) (Signed)
I am covering this patient today for Drs. Adriana Mccallum.   Referring Provider: Medicine Teaching service Primary Care Physician:  Dominic Pea, DO Primary Gastroenterologist:  none.  Reason for Consultation:  GI Bleed  HPI: [name] [name] is a 57 y.o. male  admitted last evening with complaints of weakness and black stools. Patient tells me that his bleeding started yesterday he had several episodes of black stools at home prior to coming to the hospital . He has been having some upper and left-sided abdominal pain as well over the past few days. He has been nauseated but has not had any vomiting. He says he had dizziness at home but no syncope. He has not had any prior history of GI bleeding and no prior endoscopic intervention. Hemoglobin on admission was 7.1 hematocrit 21 platelets 112 an MCV of 86 BUN 57 creatinine 1.29 liver tests were normal. His receive 2 units of packed RBCs and hemoglobin is 8.6 this morning he has remained hemodynamically stable. he also has history of cocaine abuse which he says is in active. He has history of schizophrenia, hypertension, hepatitis C with cirrhosis secondary to EtOH and hepatitis C, history of CVA and congestive heart underwent EF of about 40-45% He is not anticoagulated and states that he was not taking any regular aspirin or NSAIDs at home. Last etoh drink about a week ago.  Usually drinks 3-4 bottles of wine per week as well as beer.   Past Medical History  Diagnosis Date  . GERD (gastroesophageal reflux disease)   . Schizophrenia, schizo-affective   . Cataract   . Hyperlipidemia   . Wears glasses   . Wears dentures     upper  . Hypertension     uncontrolled with medication noncompliance  . Hepatitis C   . HIT (heparin-induced thrombocytopenia)   . Continuous chronic alcoholism   . H/O cocaine abuse   . Stroke 01/2012    Small cerebellar infarcts right greater than left as well as questionable acute left external capsule and caudate  nuclear punctate lacunar infarcts noted per MRI (01/2012) - presumed to be embolic likely source PFO with right to left shunt (noted per TEE 01/ 2014)  . Chronic combined systolic and diastolic CHF (congestive heart failure) 02/29/2012    LV EF 40-45% per 2D echo (02/2012) with grade 1 diastolic dysfunction. 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 Degenerative lumbar spinal stenosis     s/p L2-3, L3-4, L4-5 laminectomy partial facetectomy, and bilateral foraminotomy  . History of pancreatitis 01/2011    Admission for acute pancreatitis presumed 2/2 ongoing alcohol abuse and hypertriglyceridemia  . PFO (patent foramen ovale) 01/2012    with right to left shunt, noted per TEE in evaluation for source of embolic stroke in [id number]  . CKD (chronic kidney disease) stage 2, GFR 60-89 ml/min     BL SCr approximately 1-1.3  . 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 Hepatic steatosis     suspected 2/2 alcohol abuse  . Splenic cyst   . Colitis 05/2009    History of colitis of ascending colon noted on CT abd/pelvis (05/2009), with interval resolution with subsequent CT  . Hypertriglyceridemia     Past Surgical History  Procedure Laterality Date  . L2-3, l3-4, l4-5 laminectomy, partial facetectomy    . Left achilles  2007  . Tonsillectomy    .  Knee arthroscopy    . I&d extremity  03/20/2011    Procedure: IRRIGATION AND DEBRIDEMENT EXTREMITY;  Surgeon: Kerin Salen, MD;  Location: [place];  Service: Orthopedics;  Laterality: Left;  I&D LEFT ACHILLIES TENDON  . Eye surgery      right eye  . Resection distal clavical  09/17/2011    Procedure: RESECTION DISTAL CLAVICAL;  Surgeon: Nita Sells, MD;  Location: [place];  Service: Orthopedics;  Laterality: Right;  right shoulder arthroscopy with sad and open distal clavicle excision     Prior to  Admission medications   Medication Sig Start Date End Date Taking? Authorizing Provider  carvedilol (COREG) 6.25 MG tablet Take 6.25 mg by mouth 2 (two) times daily with a meal. 03/08/12  Yes Daniel J Angiulli, PA-C  cephALEXin (KEFLEX) 500 MG capsule Take 1 capsule (500 mg total) by mouth 3 (three) times daily. 06/16/12  Yes Peter S Dammen, PA-C  cloNIDine (CATAPRES) 0.2 MG tablet Take 1 tablet (0.2 mg total) by mouth 2 (two) times daily. 04/07/12  Yes Wynetta Emery, PA-C  folic acid (FOLVITE) 1 MG tablet Take 1 tablet (1 mg total) by mouth daily. 03/08/12  Yes Daniel J Angiulli, PA-C  furosemide (LASIX) 40 MG tablet Take 2 tablets (80 mg total) by mouth 2 (two) times daily. 05/26/12  Yes Shaune Pascal Bensimhon, MD  isosorbide-hydrALAZINE (BIDIL) 20-37.5 MG per tablet Take 2 tablets by mouth 3 (three) times daily. 04/07/12  Yes Wynetta Emery, PA-C  labetalol (NORMODYNE) 100 MG tablet Take 1 tablet (100 mg total) by mouth 2 (two) times daily. 06/06/12  Yes Alejandro Paya, DO  lisinopril (PRINIVIL,ZESTRIL) 20 MG tablet Take 10-20 mg by mouth 2 (two) times daily. Take 20mg  in AM and 10mg  in PM   Yes Historical Provider, MD  ondansetron (ZOFRAN) 4 MG tablet Take 1 tablet (4 mg total) by mouth every 8 (eight) hours as needed for nausea. 06/20/12  Yes Johnna Acosta, MD  potassium chloride (K-DUR,KLOR-CON) 10 MEQ tablet Take 1 tablet (10 mEq total) by mouth 2 (two) times daily. 04/07/12  Yes Wynetta Emery, PA-C  rosuvastatin (CRESTOR) 20 MG tablet Take 1 tablet (20 mg total) by mouth daily. 04/14/12  Yes Dominic Pea, DO  traZODone (DESYREL) 100 MG tablet Take 100 mg by mouth at bedtime. 03/21/12  Yes Ansel Bong, MD    Current Facility-Administered Medications  Medication Dose Route Frequency Provider Last Rate Last Dose  . folic acid injection 1 mg  1 mg Intravenous Daily Maitri S Kalia-Reynolds, DO      . LORazepam (ATIVAN) tablet 1 mg  1 mg Oral Q4H PRN Maitri S Kalia-Reynolds, DO       Or  . LORazepam  (ATIVAN) injection 1 mg  1 mg Intravenous Q4H PRN Maitri S Kalia-Reynolds, DO      . morphine 2 MG/ML injection 2 mg  2 mg Intravenous Q4H PRN Maitri S Kalia-Reynolds, DO   2 mg at 06/25/12 0058  . multivitamin with minerals tablet 1 tablet  1 tablet Oral Daily Maitri S Kalia-Reynolds, DO      . ondansetron (ZOFRAN) injection 4 mg  4 mg Intravenous Q6H PRN Maitri S Kalia-Reynolds, DO      . pantoprazole (PROTONIX) 80 mg in sodium chloride 0.9 % 250 mL infusion  8 mg/hr Intravenous Continuous Jessee Avers, MD 25 mL/hr at 06/25/12 0211 8 mg/hr at 06/25/12 0211  . sodium chloride 0.9 % injection 3 mL  3  mL Intravenous Q12H Maitri S Kalia-Reynolds, DO      . thiamine (B-1) injection 100 mg  100 mg Intravenous Daily Maitri S Kalia-Reynolds, DO        Allergies as of 06/24/2012 - Review Complete 06/24/2012  Allergen Reaction Noted  . Thorazine (chlorpromazine hcl) Other (See Comments)     Family History  Problem Relation Age of Onset  . CAD Mother 13  . CAD Sister   . CAD Brother 34    died from MI at age 70yo  . Hypertension      History   Social History  . Marital Status: Single    Spouse Name: N/A    Number of Children: N/A  . Years of Education: 12th grade   Occupational History  . Disability     2/2 schizophrenia   Social History Main Topics  . Smoking status: Former Smoker -- 0.50 packs/day for 30 years    Types: Cigarettes    Quit date: 06/24/2001  . Smokeless tobacco: Not on file  . Alcohol Use: No     Comment: frequently-- 1/5th a day, also drinks about a bottle of wine daily   . Drug Use: No     Comment: history of cocaine abuse  . Sexually Active: Not on file     Comment: 2-3 new male partners a year   Other Topics Concern  . Not on file   Social History Narrative   Lives in [place] alone, has a [facility] aide that helps with medications 4-5 days a week with medications, helping to clean.    Review of Systems: Pertinent positive and negative review of  systems were noted in the above HPI section.  All other review of systems was otherwise negative.  Physical Exam: Vital signs in last 24 hours: Temp:  [98.1 F (36.7 C)-99 F (37.2 C)] 98.3 F (36.8 C) (05/31 0731) Pulse Rate:  [69-109] 69 (05/31 0700) Resp:  [13-32] 18 (05/31 0700) BP: (62-155)/(38-91) 146/70 mmHg (05/31 0700) SpO2:  [98 %-100 %] 100 % (05/31 0700) Weight:  [231 lb 0.7 oz (104.8 kg)] 231 lb 0.7 oz (104.8 kg) (05/31 0049) Last BM Date: 06/25/12 General:   Alert,  Well-developed, well-nourished,AA male  pleasant and cooperative in NAD Head:  Normocephalic and atraumatic. Eyes:  Sclera clear, no icterus.   Conjunctiva pink. Ears:  Normal auditory acuity. Nose:  No deformity, discharge,  or lesions. Mouth:  No deformity or lesions.   Neck:  Supple; no masses or thyromegaly. Lungs:  Clear throughout to auscultation.   No wheezes, crackles, or rhonchi. Heart:  Regular rate and rhythm; no murmurs, clicks, rubs,  or gallops. Abdomen:  Soft,tender epigastrium and LUQ/LMQ, BS active,nonpalp mass or hsm.   Rectal:  Deferred- rectal in ER with melena Msk:  Symmetrical without gross deformities. . Pulses:  Normal pulses noted. Extremities:  Without clubbing or edema. Neurologic:  Alert and  oriented x4;  grossly normal neurologically. Skin:  Intact without significant lesions or rashes.. Psych:  Alert and cooperative. Normal mood and affect.  Intake/Output from previous day: 05/30 0701 - 05/31 0700 In: 1162.5 [P.O.:240; I.V.:160; Blood:762.5] Out: 1300 [Urine:1300] Intake/Output this shift:    Lab Results:  Recent Labs  06/24/12 2056 06/25/12 0647  WBC 7.2 6.4  HGB 7.1* 8.6*  HCT 21.0* 25.1*  PLT 112* 110*   BMET  Recent Labs  06/24/12 2056  NA 141  K 4.3  CL 111  CO2 22  GLUCOSE 108*  BUN 57*  CREATININE 1.29  CALCIUM 8.3*   LFT  Recent Labs  06/24/12 2056  PROT 5.1*  ALBUMIN 2.8*  AST 23  ALT 18  ALKPHOS 45  BILITOT 0.3    PT/INR  Recent Labs  06/24/12 2149  LABPROT 16.2*  INR 1.33    Studies/Results:    Upper abdominal ultrasound in January 2014 showed common bile duct of 11 mm normal gallbladder cirrhotic liver and pancreas was not well visualized  IMPRESSION:  #83 57 year old male with acute upper GI bleed presenting with melena and significant normocytic anemia. This is in the setting of known cirrhosis. Etiologies include variceal bleed portal gastropathy, acute esophagitis or ulcer disease. #2 normocytic anemia secondary to above #3 thrombocytopenia #4 hepatic cirrhosis secondary to EtOH and hepatitis C  #5 history of EtOH abuse patient states inactive currently #6 HX cocaine abuse #7 schizophrenia/schizoaffective disorder #8 hypertension #9 strip CVA #10 history of CHF  Plan; Serial hemoglobins and keep hemoglobin in the 8 range Scheduled for upper endoscopy with Dr. Ardis Hughs this morning, procedure discussed in detail with the patient and he is agreeable to proceed. Continue Protonix infusion No previous ultrasound January 2014 showed common bile duct of 11 mm-question underlying biliary stricture secondary to recurrent/chronic pancreatitis. LFTs are currently normal. Consider further evaluation once acute bleed has stabilized   [name] [name]  06/25/2012, 8:03 AM   ________________________________________________________________________  Velora Heckler GI MD note (I am covering this patient today for Drs. Adriana Mccallum):  I personally examined the patient, reviewed the data and agree with the assessment and plan described above.  Melena in cirrhotic, alcoholic drinking up until one week ago.   Planning on EGD later today.   Owens Loffler, MD [facility]enter[facility] Pager [phone]

## 2012-06-25 NOTE — ED Notes (Signed)
Attempted report 

## 2012-06-25 NOTE — Op Note (Signed)
[facility] [place], [address]   ENDOSCOPY PROCEDURE REPORT  PATIENT: [name], [name]  MR#: [id number] BIRTHDATE: 03-22-1955 , 89  yrs. old GENDER: Male ENDOSCOPIST: Milus Banister, MD REFERRED BY:  I am covering "unassigned" call at [facility] this weekend for Drs. Benson Norway and Mann PROCEDURE DATE:  06/25/2012 PROCEDURE:  EGD w/ biopsy and EGD w/ control of bleeding ASA CLASS:     Class IV INDICATIONS:  melena, etoh/hep C cirrhosis, recent etoh drinking. MEDICATIONS: Fentanyl 100 mcg IV, Versed 9 mg IV, and Benadryl 25 mg IV TOPICAL ANESTHETIC: Cetacaine Spray DESCRIPTION OF PROCEDURE: After the risks benefits and alternatives of the procedure were thoroughly explained, informed consent was obtained.  The Pentax Gastroscope [id number] endoscope was introduced through the mouth and advanced to the second portion of the duodenum. Without limitations.  The instrument was slowly withdrawn as the mucosa was fully examined.   There were multiple ulcers in his stomach.  Most were aligned in linear fashion in antrum.  These all ranged from 2-6cm across and were all clean based.  Biopsies were taken from surrounding moderate pan-gastritis.  The largest ulcer was 1.5cm across, located on incisure of stomach and it contained a clear visible vessel within the crater.  This was treated with injection of dilute epinephine (3.5cc) around the vessel and then a single clip was placed directly on the vessel appearing to competely clip the vessel within the crater.  The examination was otherwise normal. Retroflexed views revealed no abnormalities.     The scope was then withdrawn from the patient and the procedure completed. COMPLICATIONS: There were no complications.  ENDOSCOPIC IMPRESSION: There were multiple ulcers in his stomach.  Biopsies were taken from surrounding moderate pan-gastritis.  The largest ulcer was 1.5cm across, located on incisure of  stomach and it contained a clear visible vessel within the crater.  This was treated with epinephrine injection and endoclip placement.  RECOMMENDATIONS: IV PPI gtts should be continued for another 24-26 hours.  If biopsies from stomach show H.  pylori, I will prescribe appropriate antibiotics.  He will be educated to completely avoid NSAID type meds as well.  He will require repeat EGD in 2 months to confirm ulcers are healing.  I will leave this to Drs.  Mann/Hung for whom I am covering this weekend and will clearly communicate this with them.   eSigned:  Milus Banister, MD 06/25/2012 11:00 AM   CC: Dr. Juanita Craver and Dr. Carol Ada

## 2012-06-26 ENCOUNTER — Encounter (HOSPITAL_COMMUNITY): Payer: Self-pay | Admitting: Gastroenterology

## 2012-06-26 LAB — CBC
HCT: 26.4 % — ABNORMAL LOW (ref 39.0–52.0)
Hemoglobin: 9 g/dL — ABNORMAL LOW (ref 13.0–17.0)
Hemoglobin: 8.1 g/dL — ABNORMAL LOW (ref 13.0–17.0)
MCHC: 34.1 g/dL (ref 30.0–36.0)
MCV: 87 fL (ref 78.0–100.0)
MCV: 86.8 fL (ref 78.0–100.0)
Platelets: 104 10*3/uL — ABNORMAL LOW (ref 150–400)
Platelets: 120 10*3/uL — ABNORMAL LOW (ref 150–400)
RBC: 2.76 MIL/uL — ABNORMAL LOW (ref 4.22–5.81)
RBC: 2.73 MIL/uL — ABNORMAL LOW (ref 4.22–5.81)
RDW: 14.6 % (ref 11.5–15.5)
RDW: 14.6 % (ref 11.5–15.5)
WBC: 4.9 10*3/uL (ref 4.0–10.5)
WBC: 6.2 10*3/uL (ref 4.0–10.5)
WBC: 5.8 10*3/uL (ref 4.0–10.5)

## 2012-06-26 LAB — RAPID URINE DRUG SCREEN, HOSP PERFORMED
Amphetamines: NOT DETECTED
Benzodiazepines: POSITIVE — AB
Cocaine: NOT DETECTED
Opiates: POSITIVE — AB
Tetrahydrocannabinol: NOT DETECTED

## 2012-06-26 LAB — GLUCOSE, CAPILLARY: Glucose-Capillary: 104 mg/dL — ABNORMAL HIGH (ref 70–99)

## 2012-06-26 MED ORDER — CARVEDILOL 6.25 MG PO TABS
6.2500 mg | ORAL_TABLET | Freq: Two times a day (BID) | ORAL | Status: DC
  Administered 2012-06-26 – 2012-06-27 (×3): 6.25 mg via ORAL
  Filled 2012-06-26 (×5): qty 1

## 2012-06-26 NOTE — Progress Notes (Signed)
Internal Medicine Attending  Date: 06/26/2012  Patient name: [name] [name] Medical record number: [id number] Date of birth: 05-05-1955 Age: 57 y.o. Gender: male  I saw and evaluated the patient. I reviewed the resident's note by Dr. Blaine Hamper and I agree with the resident's findings and plans as documented in his progress note.  Mr. Blunk is feeling much improved. He continues to have some mild left upper quadrant pain and black tarry stools. He required one unit of packed red blood cells overnight. He is hemodynamically stable and ready for transfer to the general medical ward as we continue to follow serial hematocrits on the intravenous PPI drip. If his hematocrit remains stable I suspect he may be ready for discharge home tomorrow.

## 2012-06-26 NOTE — Progress Notes (Signed)
[facility] Progress Note  I am covering this patient today for Drs. Adriana Mccallum.   Since last GI note: EGD yesterday, full report in chart. Multiple GUs, largest had visible vessel which was treated with epi and endoclip.  Biopsies taken from gastritis to check for H. Pylori  Overnight he's done well.  No overt gi bleeding.  Tolerated clears.  Hemodynamically stable.  Has been given total of 3 units blood, AM H/H pending  Objective: Vital signs in last 24 hours: Temp:  [97.2 F (36.2 C)-99 F (37.2 C)] 99 F (37.2 C) (06/01 0712) Pulse Rate:  [66-88] 68 (06/01 0712) Resp:  [0-67] 16 (06/01 0030) BP: (124-192)/(56-119) 159/70 mmHg (06/01 0700) SpO2:  [81 %-100 %] 100 % (06/01 0700) Weight:  [231 lb 11.3 oz (105.1 kg)] 231 lb 11.3 oz (105.1 kg) (06/01 0500) Last BM Date: 06/25/12 General: alert and oriented times 3 Heart: regular rate and rythm Abdomen: soft, non-tender, non-distended, normal bowel sounds   Lab Results:  Recent Labs  06/25/12 0850 06/25/12 1426 06/25/12 2305  WBC 6.4 7.5 6.5  HGB 8.6* 8.8* 7.7*  PLT 105* 119* 102*  MCV 86.0 86.2 86.2    Recent Labs  06/24/12 2056 06/25/12 0648  NA 141 143  K 4.3 4.2  CL 111 114*  CO2 22 19  GLUCOSE 108* 96  BUN 57* 52*  CREATININE 1.29 0.98  CALCIUM 8.3* 8.8    Recent Labs  06/24/12 2056 06/25/12 0648  PROT 5.1* 5.5*  ALBUMIN 2.8* 2.9*  AST 23 20  ALT 18 18  ALKPHOS 45 42  BILITOT 0.3 0.6    Recent Labs  06/24/12 2149  INR 1.33     Studies/Results: No results found.   Medications: Scheduled Meds: . folic acid  1 mg Intravenous Daily  . multivitamin with minerals  1 tablet Oral Daily  . sodium chloride  3 mL Intravenous Q12H  . thiamine  100 mg Intravenous Daily   Continuous Infusions: . pantoprozole (PROTONIX) infusion 8 mg/hr (06/26/12 0000)   PRN Meds:.LORazepam, LORazepam, morphine injection, ondansetron    Assessment/Plan: 57 y.o. male with GI bleeding from  gastric ulcers  He took a lot of NSAIDs several weeks ago ("handfulls") when he ran out of narcotic pain meds. That is likely the etiology however if biopsies show H. Pylori, will start him on appropriate abx.  He will need to completely avoid NSAIDs in the future.  Can d/c PPI gtts tomorrow AM and start twice daily PO PPI. HE should stay on twice daily PPI until repeat EGD in about 2 months.  Dr. Collene Mares will assume coverage tomorrow AM, repeat EGD timing per her. OK to advance diet today.   Owens Loffler, MD  06/26/2012, 8:28 AM [facility] Pager [phone]

## 2012-06-26 NOTE — Progress Notes (Signed)
Subjective:  - Patient feels well after had EGD w/ biopsy and EGD w/ control of bleeding procedure on 5/31.  - He still has some black colored stool. - Hgb decreased from 8.8-->7.7 and back up to 8.1 after 1 U of blood.  - Has mild abdominal pain. - No fever or chills.  Objective: Vital signs in last 24 hours: Filed Vitals:   06/26/12 0712 06/26/12 0800 06/26/12 0900 06/26/12 1000  BP:  151/64  178/81  Pulse: 68 79 67 67  Temp: 99 F (37.2 C)     TempSrc: Oral     Resp:      Height:      Weight:      SpO2:  100% 100% 100%   Weight change: 10.6 oz (0.3 kg)  Intake/Output Summary (Last 24 hours) at 06/26/12 1024 Last data filed at 06/26/12 0900  Gross per 24 hour  Intake 1332.5 ml  Output   3250 ml  Net -1917.5 ml    Physical Exam:   Filed Vitals:   06/26/12 0712 06/26/12 0800 06/26/12 0900 06/26/12 1000  BP:  151/64  178/81  Pulse: 68 79 67 67  Temp: 99 F (37.2 C)     TempSrc: Oral     Resp:      Height:      Weight:      SpO2:  100% 100% 100%    General: Not in acute distress. HEENT: PERRL, EOMI, no scleral icterus, No JVD or bruit Cardiac: S1/S2, RRR, No murmurs, gallops or rubs Pulm: Good air movement bilaterally. Clear to auscultation bilaterally. No rales, wheezing, rhonchi or rubs. Abd: Soft, nondistended, mild tenderness to deep palpation over epigastric area. , no rebound pain, no organomegaly, BS present Ext: No edema. 2+DP/PT pulse bilaterally. Missing right big toe nail, no signs of infection. Musculoskeletal: No joint deformities, erythema, or stiffness. Skin: No rashes.  Neuro: Alert and oriented X3, cranial nerves II-XII grossly intact, muscle strength 5/5 in all extremeties, sensation to light touch intact. Brachial reflex 2+ bilaterally.    Lab Results: Basic Metabolic Panel:  Recent Labs Lab 06/24/12 2056 06/25/12 0648  NA 141 143  K 4.3 4.2  CL 111 114*  CO2 22 19  GLUCOSE 108* 96  BUN 57* 52*  CREATININE 1.29 0.98  CALCIUM  8.3* 8.8  MG  --  1.7   Liver Function Tests:  Recent Labs Lab 06/24/12 2056 06/25/12 0648  AST 23 20  ALT 18 18  ALKPHOS 45 42  BILITOT 0.3 0.6  PROT 5.1* 5.5*  ALBUMIN 2.8* 2.9*    Recent Labs Lab 06/20/12 1240 06/24/12 2056  LIPASE 76* 61*  AMYLASE 69  --    No results found for this basename: AMMONIA,  in the last 168 hours CBC:  Recent Labs Lab 06/20/12 1240 06/24/12 2056  06/25/12 2305 06/26/12 0745  WBC 7.1 7.2  < > 6.5 4.9  NEUTROABS 5.1 5.2  --   --   --   HGB 14.8 7.1*  < > 7.7* 8.1*  HCT 42.5 21.0*  < > 22.5* 24.0*  MCV 85.9 86.1  < > 86.2 87.0  PLT 176 112*  < > 102* 104*  < > = values in this interval not displayed. Cardiac Enzymes:  Recent Labs Lab 06/25/12 0850 06/25/12 1426 06/25/12 2105  TROPONINI <0.30 <0.30 <0.30   BNP: No results found for this basename: PROBNP,  in the last 168 hours D-Dimer: No results found for this basename: DDIMER,  in the last 168 hours CBG:  Recent Labs Lab 06/25/12 0732 06/25/12 1005 06/25/12 1154 06/25/12 1701 06/26/12 0715  GLUCAP 73 65* 102* 72 97   Hemoglobin A1C: No results found for this basename: HGBA1C,  in the last 168 hours Fasting Lipid Panel: No results found for this basename: CHOL, HDL, LDLCALC, TRIG, CHOLHDL, LDLDIRECT,  in the last 168 hours Thyroid Function Tests: No results found for this basename: TSH, T4TOTAL, FREET4, T3FREE, THYROIDAB,  in the last 168 hours Coagulation:  Recent Labs Lab 06/24/12 2149  LABPROT 16.2*  INR 1.33   Anemia Panel: No results found for this basename: VITAMINB12, FOLATE, FERRITIN, TIBC, IRON, RETICCTPCT,  in the last 168 hours Urine Drug Screen: Drugs of Abuse     Component Value Date/Time   LABOPIA PPS 04/14/2012 1015   LABOPIA NONE DETECTED 02/20/2012 0105   COCAINSCRNUR NEG 04/14/2012 1015   COCAINSCRNUR NONE DETECTED 02/20/2012 0105   LABBENZ NEG 04/14/2012 1015   LABBENZ NONE DETECTED 02/20/2012 0105   AMPHETMU NONE DETECTED 02/20/2012  0105   THCU NONE DETECTED 02/20/2012 0105   LABBARB PPS 04/14/2012 1015   LABBARB NONE DETECTED 02/20/2012 0105    Alcohol Level:  Recent Labs Lab 06/25/12 0648  ETH <11   Urinalysis:  Recent Labs Lab 06/20/12 1559 06/24/12 2055  COLORURINE YELLOW YELLOW  LABSPEC 1.033* 1.021  PHURINE 5.0 5.5  GLUCOSEU NEGATIVE NEGATIVE  HGBUR NEGATIVE NEGATIVE  BILIRUBINUR SMALL* NEGATIVE  KETONESUR 15* NEGATIVE  PROTEINUR 100* NEGATIVE  UROBILINOGEN 1.0 0.2  NITRITE NEGATIVE NEGATIVE  LEUKOCYTESUR NEGATIVE NEGATIVE   Micro Results: Recent Results (from the past 240 hour(s))  MRSA PCR SCREENING     Status: None   Collection Time    06/25/12 12:37 AM      Result Value Range Status   MRSA by PCR NEGATIVE  NEGATIVE Final   Comment:            The GeneXpert MRSA Assay (FDA     approved for NASAL specimens     only), is one component of a     comprehensive MRSA colonization     surveillance program. It is not     intended to diagnose MRSA     infection nor to guide or     monitor treatment for     MRSA infections.   Studies/Results: No results found. Medications:  Scheduled Meds: . folic acid  1 mg Intravenous Daily  . multivitamin with minerals  1 tablet Oral Daily  . sodium chloride  3 mL Intravenous Q12H  . thiamine  100 mg Intravenous Daily   Continuous Infusions: . pantoprozole (PROTONIX) infusion 8 mg/hr (06/26/12 0000)   PRN Meds:.LORazepam, LORazepam, morphine injection, ondansetron Assessment/Plan:  1) Upper GI bleeding with acute blood loss anemia: Hgb was 7.1 on admission (BL 14.8). GI was consulted. EGD w/ biopsy and EGD w/ control of bleeding were performed by Dr. Ardis Hughs on 5/31. Multiple ulcers in his stomach were found, with biggest one of 1.5 cm. It contained a clear  visible vessel within the crater. This was treated with epinephrine injection and endoclip placement. Biopsies were taken. Patient tolerated the procedure well. He is currently hemodynamically  stable with blood pressure 164/79 mmHg and heart rate 69/min. Hgb decreased from 8.8-->7.7 and back up to 8.1 after 1 U of blood. Trop neg X 3  - Appreciate GI's consult in managing our patient.. - will follow GI's recommendation: continue IV PPI gtts until 6/2 AM, then switch to  PO bid. Will f/u biopsy result. If positive for H pylori, will treat with appropriate antibiotics. Will avoid NSAIDs. Will provide pt with a list of NSAIDs at discharge. Patient will need to have a repeat EGD in 2 months to confirm ulcers are healing. -Will continue cbc q8h for the next. - Continue to hold clonidine, labetalol, Lasix, and Imdur. 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 EF with DD grade 1 of 35 to 40% 02/2012. Home regimen includes Lasix, Lipitor, Imdur, and carvedilol.   Plan   - Will hold those medications in the setting of problem except for Carvedilol today   - will start Carvedilol today 6.25 mg bid.  3) Chronic abdominal pain: Etiology is unclear. Patient says that he has chronic pancreatitis and goes to pain clinic. He does not take chronic narcotics. There is demonstrable tenderness of the left side of his abdomen on admission. Abdominal pelvis CT scan on 03/04/2011 revealed acute pancreatitis, no calcifications were reported. He has mildly elevated lipase level for a prolonged duration. Lipase 61<--76 (06/20/2012). Possiblly he has chronic pancreatitis given his alcohol history.   Plan   - No clinical indication for abdominal imaging at this time - Advanced diet to heart health - Pain management as needed.  4) Thrombocytopenia: Baseline platelet count is normal. On admission platelet count of 112. Likely secondary to alcohol abuse and acute blood loss.  5) Hepatitis C  infection: This is untreated. This can be addressed as outpatient.  6) Alcohol abuse: Patient reports that has not been drinking over the last 1 week since presentation to the ED. He admits to heavy previous drinking. His alcohol blood level of 319 on 06/15/2012. AST/ALT ratio consistent with alcohol abuse Plan. -pending UDS -CIWA protocol   7) Liver Cirrhosis: Secondary to alcohol abuse and hepatitis C. AST of 61, ALT of 23 alkaline phosphatase of 45, albumin of 2.8. INR is 1.3. Abdominal ultrasound scan 12/2009 showed hepatic steatosis and more recently in 01/2012 features of cirrhosis on abdomen ultrasound scan. No clinical signs of chronic liver disease. Bleeding was confirmed by EGD due to gastric ulcers. -will follow up closely.  8)Schizophrenia: No acute psychosis. Patient reports that [facility]. Currently not on any antipsychotic medications. However, he is on trazodone 100 mg at bedtime. Will continue to monitor for symptoms, and continue with as outpatient.  6) CKD 2: Stage 2 with BL creatine of 1-1.3. Currently stable. Will monitor.  7) Hypertension: Will hold his home medications in the setting of acute GI bleed except for Carvedilol which is mainly for CHF.     DVT PPX - SCD's while in bed  CODE STATUS - Full  CONSULTS PLACED - GI  DISPO - Disposition is deferred at this time, awaiting stabilization and evaluation of his GI bleed.    Anticipated discharge in approximately 3-5 day(s).    The patient does have a current PCP Cletus Gash [place], [place] [phone]) and does need an [facility] hospital follow-up appointment after discharge.     Is the [facility] hospital follow-up appointment a one-time only appointment? no.  Does the patient have transportation limitations that hinder transportation to clinic appointments? unknown.    LOS: 2 days   Ivor Costa 06/26/2012, 10:24 AM

## 2012-06-27 ENCOUNTER — Ambulatory Visit (HOSPITAL_COMMUNITY): Payer: PRIVATE HEALTH INSURANCE | Attending: Internal Medicine

## 2012-06-27 LAB — CBC
HCT: 25.5 % — ABNORMAL LOW (ref 39.0–52.0)
Hemoglobin: 8.6 g/dL — ABNORMAL LOW (ref 13.0–17.0)
MCV: 88.5 fL (ref 78.0–100.0)
WBC: 5.6 10*3/uL (ref 4.0–10.5)

## 2012-06-27 LAB — BASIC METABOLIC PANEL
CO2: 22 mEq/L (ref 19–32)
Chloride: 104 mEq/L (ref 96–112)
Glucose, Bld: 100 mg/dL — ABNORMAL HIGH (ref 70–99)
Sodium: 136 mEq/L (ref 135–145)

## 2012-06-27 MED ORDER — PANTOPRAZOLE SODIUM 40 MG PO TBEC
40.0000 mg | DELAYED_RELEASE_TABLET | Freq: Two times a day (BID) | ORAL | Status: DC
  Administered 2012-06-27: 40 mg via ORAL
  Filled 2012-06-27: qty 1

## 2012-06-27 MED ORDER — OXYCODONE-ACETAMINOPHEN 5-325 MG PO TABS: 1.0000 | ORAL_TABLET | ORAL | Status: DC | PRN

## 2012-06-27 MED ORDER — FOLIC ACID 1 MG PO TABS
1.0000 mg | ORAL_TABLET | Freq: Every day | ORAL | Status: DC
  Administered 2012-06-27: 1 mg via ORAL
  Filled 2012-06-27: qty 1

## 2012-06-27 MED ORDER — PANTOPRAZOLE SODIUM 40 MG PO TBEC: 40.0000 mg | DELAYED_RELEASE_TABLET | Freq: Two times a day (BID) | ORAL | Status: DC

## 2012-06-27 MED ORDER — VITAMIN B-1 100 MG PO TABS
100.0000 mg | ORAL_TABLET | Freq: Every day | ORAL | Status: DC
  Administered 2012-06-27: 100 mg via ORAL
  Filled 2012-06-27: qty 1

## 2012-06-27 NOTE — Progress Notes (Signed)
Subjective: Pt doing well. No acute events overnight. Ready to d/c.   Objective: Vital signs in last 24 hours: Filed Vitals:   06/26/12 1222 06/26/12 2124 06/27/12 0500 06/27/12 0559  BP: 155/81 149/75  158/77  Pulse: 68 64  63  Temp: 99.1 F (37.3 C) 98.5 F (36.9 C)  98.8 F (37.1 C)  TempSrc: Oral     Resp: 18 18  18   Height:      Weight:   236 lb 11.2 oz (107.366 kg)   SpO2: 100% 99%  100%   Weight change: 4 lb 15.9 oz (2.266 kg)  Intake/Output Summary (Last 24 hours) at 06/27/12 1033 Last data filed at 06/27/12 0829  Gross per 24 hour  Intake   1040 ml  Output    400 ml  Net    640 ml    Physical Exam:   Filed Vitals:   06/26/12 1222 06/26/12 2124 06/27/12 0500 06/27/12 0559  BP: 155/81 149/75  158/77  Pulse: 68 64  63  Temp: 99.1 F (37.3 C) 98.5 F (36.9 C)  98.8 F (37.1 C)  TempSrc: Oral     Resp: 18 18  18   Height:      Weight:   236 lb 11.2 oz (107.366 kg)   SpO2: 100% 99%  100%    General: Not in acute distress. HEENT: PERRL, EOMI, no scleral icterus, No JVD or bruit Cardiac: S1/S2, RRR, No murmurs, gallops or rubs Pulm: Good air movement bilaterally. Clear to auscultation bilaterally. No rales, wheezing, rhonchi or rubs. Abd: Soft, nondistended, nttp, no rebound pain, no organomegaly, BS present Ext: No edema. 2+DP/PT pulse bilaterally. Missing right big toe nail, no signs of infection. Musculoskeletal: No joint deformities, erythema, or stiffness. Skin: No rashes.  Neuro: Alert and oriented X3, cranial nerves II-XII grossly intact, muscle strength 5/5 in all extremeties, sensation to light touch intact. Brachial reflex 2+ bilaterally.    Lab Results: Basic Metabolic Panel:  Recent Labs Lab 06/25/12 0648 06/27/12 0900  NA 143 136  K 4.2 3.8  CL 114* 104  CO2 19 22  GLUCOSE 96 100*  BUN 52* 12  CREATININE 0.98 0.73  CALCIUM 8.8 9.1  MG 1.7  --    Liver Function Tests:  Recent Labs Lab 06/24/12 2056 06/25/12 0648  AST 23 20   ALT 18 18  ALKPHOS 45 42  BILITOT 0.3 0.6  PROT 5.1* 5.5*  ALBUMIN 2.8* 2.9*    Recent Labs Lab 06/20/12 1240 06/24/12 2056  LIPASE 76* 61*  AMYLASE 69  --    CBC:  Recent Labs Lab 06/20/12 1240 06/24/12 2056  06/26/12 2255 06/27/12 0900  WBC 7.1 7.2  < > 5.8 5.6  NEUTROABS 5.1 5.2  --   --   --   HGB 14.8 7.1*  < > 8.1* 8.6*  HCT 42.5 21.0*  < > 23.7* 25.5*  MCV 85.9 86.1  < > 86.8 88.5  PLT 176 112*  < > 120* 126*  < > = values in this interval not displayed. Cardiac Enzymes:  Recent Labs Lab 06/25/12 0850 06/25/12 1426 06/25/12 2105  TROPONINI <0.30 <0.30 <0.30   CBG:  Recent Labs Lab 06/25/12 1005 06/25/12 1105 06/25/12 1154 06/25/12 1701 06/26/12 0715 06/26/12 1230  GLUCAP 65* 153* 102* 72 97 104*   Coagulation:  Recent Labs Lab 06/24/12 2149  LABPROT 16.2*  INR 1.33   Urine Drug Screen: Drugs of Abuse     Component Value Date/Time  LABOPIA POSITIVE* 06/26/2012 2028   LABOPIA PPS 04/14/2012 1015   COCAINSCRNUR NONE DETECTED 06/26/2012 2028   COCAINSCRNUR NEG 04/14/2012 1015   LABBENZ POSITIVE* 06/26/2012 2028   LABBENZ NEG 04/14/2012 1015   AMPHETMU NONE DETECTED 06/26/2012 2028   THCU NONE DETECTED 06/26/2012 2028   LABBARB POSITIVE* 06/26/2012 2028   LABBARB PPS 04/14/2012 1015    Alcohol Level:  Recent Labs Lab 06/25/12 0648  ETH <11   Urinalysis:  Recent Labs Lab 06/20/12 1559 06/24/12 2055  COLORURINE YELLOW YELLOW  LABSPEC 1.033* 1.021  PHURINE 5.0 5.5  GLUCOSEU NEGATIVE NEGATIVE  HGBUR NEGATIVE NEGATIVE  BILIRUBINUR SMALL* NEGATIVE  KETONESUR 15* NEGATIVE  PROTEINUR 100* NEGATIVE  UROBILINOGEN 1.0 0.2  NITRITE NEGATIVE NEGATIVE  LEUKOCYTESUR NEGATIVE NEGATIVE   Micro Results: Recent Results (from the past 240 hour(s))  MRSA PCR SCREENING     Status: None   Collection Time    06/25/12 12:37 AM      Result Value Range Status   MRSA by PCR NEGATIVE  NEGATIVE Final   Comment:            The GeneXpert MRSA Assay (FDA      approved for NASAL specimens     only), is one component of a     comprehensive MRSA colonization     surveillance program. It is not     intended to diagnose MRSA     infection nor to guide or     monitor treatment for     MRSA infections.   Studies/Results: No results found. Medications:  Scheduled Meds: . carvedilol  6.25 mg Oral BID WC  . folic acid  1 mg Oral Daily  . multivitamin with minerals  1 tablet Oral Daily  . pantoprazole  40 mg Oral BID  . sodium chloride  3 mL Intravenous Q12H  . thiamine  100 mg Oral Daily   Continuous Infusions:   PRN Meds:.LORazepam, LORazepam, morphine injection, ondansetron Assessment/Plan:  1) Upper GI bleeding with acute blood loss anemia: Hgb was 7.1 on admission (BL 14.8). GI was consulted. EGD w/ biopsy and EGD w/ control of bleeding were performed by Dr. Ardis Hughs on 5/31. Multiple ulcers in his stomach were found, with biggest one of 1.5 cm. It contained a clear  visible vessel within the crater. This was treated with epinephrine injection and endoclip placement. Biopsies were taken. Patient tolerated the procedure well. He is currently hemodynamically stable with blood pressure 164/79 mmHg and heart rate 69/min. Hgb decreased from 8.8-->7.7 and back up to 8.1 after 1 U of blood. Trop neg X 3  - Appreciate GI's consult in managing our patient.. 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 EF with DD grade 1 of 35 to 40% 02/2012. Home regimen includes Lasix, Lipitor, Imdur, and carvedilol.   Plan   - Will hold those medications in the setting of problem except for Carvedilol today   - will start Carvedilol today 6.25 mg bid.  3) Chronic abdominal pain: Etiology is unclear. Patient says that he has chronic pancreatitis and goes to  pain clinic. He does not take chronic narcotics. There is demonstrable tenderness of the left side of his abdomen on admission. Abdominal pelvis CT scan on 03/04/2011 revealed acute pancreatitis, no calcifications were reported. He has mildly elevated lipase  level for a prolonged duration. Lipase 61<--76 (06/20/2012). Possiblly he has chronic pancreatitis given his alcohol history.   Plan   - No clinical indication for abdominal imaging at this time - Advanced diet to heart health - Pain management as needed.  4) Thrombocytopenia: Baseline platelet count is normal. On admission platelet count of 112. Likely secondary to alcohol abuse and acute blood loss.  5) Hepatitis C infection: This is untreated. This can be addressed as outpatient.  6) Alcohol abuse: Patient reports that has not been drinking over the last 1 week since presentation to the ED. He admits to heavy previous drinking. His alcohol blood level of 319 on 06/15/2012. AST/ALT ratio consistent with alcohol abuse Plan. -pending UDS -CIWA protocol   7) Liver Cirrhosis: Secondary to alcohol abuse and hepatitis C. AST of 61, ALT of 23 alkaline phosphatase of 45, albumin of 2.8. INR is 1.3. Abdominal ultrasound scan 12/2009 showed hepatic steatosis and more recently in 01/2012 features of cirrhosis on abdomen ultrasound scan. No clinical signs of chronic liver disease. Bleeding was confirmed by EGD due to gastric ulcers. -will follow up closely.  8)Schizophrenia: No acute psychosis. Patient reports that [facility]. Currently not on any antipsychotic medications. However, he is on trazodone 100 mg at bedtime. Will continue to monitor for symptoms, and continue with as outpatient.  6) CKD 2: Stage 2 with BL creatine of 1-1.3. Currently stable. Will monitor.  7) Hypertension: Will hold his home medications in the setting of acute GI bleed except for Carvedilol which is mainly for CHF.     DVT PPX - SCD's while  in bed  CODE STATUS - Full  CONSULTS PLACED - GI  DISPO - Disposition is deferred at this time, awaiting stabilization and evaluation of his GI bleed.    Anticipated discharge in approximately 3-5 day(s).    The patient does have a current PCP Cletus Gash [place], [place] [phone]) and does need an [facility] hospital follow-up appointment after discharge.     Is the [name] hospital follow-up appointment a one-time only appointment? no.  Does the patient have transportation limitations that hinder transportation to clinic appointments? unknown.    LOS: 3 days   Clinton Gallant 06/27/2012, 10:33 AM Pgr: [phone]

## 2012-06-27 NOTE — Progress Notes (Signed)
Patient discharged to home with a bus pass. He has assistance at home from an aide.

## 2012-06-28 LAB — TYPE AND SCREEN
Antibody Screen: NEGATIVE
Unit division: 0
Unit division: 0

## 2012-06-28 NOTE — Discharge Summary (Signed)
[facility] Discharge Note  Name: [name] [name] MRN: [id number] DOB: August 15, 1955 57 y.o.  Date of Admission: 06/24/2012  7:32 PM Date of Discharge: 06/27/2012 Attending Physician: Dr. Cathren Laine  Discharge Diagnosis: Principal Problem:   Acute GI bleeding Active Problems:   HEPATITIS C   HYPERLIPIDEMIA   SCHIZOPHRENIA   SUBSTANCE ABUSE, MULTIPLE   PANCREATITIS, HX OF   HTN (hypertension)   Chronic combined systolic and diastolic CHF (congestive heart failure)   Thrombocytopenia   CKD (chronic kidney disease) stage 2, GFR 60-89 ml/min   Cirrhosis of liver without mention of alcohol   Gastric ulcer with hemorrhage   Discharge Medications:   Medication List    STOP taking these medications       cephALEXin 500 MG capsule  Commonly known as:  KEFLEX      TAKE these medications       carvedilol 6.25 MG tablet  Commonly known as:  COREG  Take 6.25 mg by mouth 2 (two) times daily with a meal.     cloNIDine 0.2 MG tablet  Commonly known as:  CATAPRES  Take 1 tablet (0.2 mg total) by mouth 2 (two) times daily.     folic acid 1 MG tablet  Commonly known as:  FOLVITE  Take 1 tablet (1 mg total) by mouth daily.     furosemide 40 MG tablet  Commonly known as:  LASIX  Take 2 tablets (80 mg total) by mouth 2 (two) times daily.     isosorbide-hydrALAZINE 20-37.5 MG per tablet  Commonly known as:  BIDIL  Take 2 tablets by mouth 3 (three) times daily.     labetalol 100 MG tablet  Commonly known as:  NORMODYNE  Take 1 tablet (100 mg total) by mouth 2 (two) times daily.     lisinopril 20 MG tablet  Commonly known as:  PRINIVIL,ZESTRIL  Take 10-20 mg by mouth 2 (two) times daily. Take 20mg  in AM and 10mg  in PM     ondansetron 4 MG tablet  Commonly known as:  ZOFRAN  Take 1 tablet (4 mg total) by mouth every 8 (eight) hours as needed for nausea.     oxyCODONE-acetaminophen 5-325 MG per tablet  Commonly known as:  ROXICET  Take 1 tablet by  mouth every 4 (four) hours as needed for pain.     pantoprazole 40 MG tablet  Commonly known as:  PROTONIX  Take 1 tablet (40 mg total) by mouth 2 (two) times daily with a meal.     potassium chloride 10 MEQ tablet  Commonly known as:  K-DUR,KLOR-CON  Take 1 tablet (10 mEq total) by mouth 2 (two) times daily.     rosuvastatin 20 MG tablet  Commonly known as:  CRESTOR  Take 1 tablet (20 mg total) by mouth daily.     traZODone 100 MG tablet  Commonly known as:  DESYREL  Take 100 mg by mouth at bedtime.        Disposition and follow-up:   Mr.Kaynen A Watry was discharged from [facility] in Good condition.  At the hospital follow up visit please address: 1. CBC for Hgb 2.avoidance of NSAIDs 3. Pain management  Follow-up Appointments:       Future Appointments Provider Department Dept Phone   07/07/2012 10:45 AM Dominic Pea, DO [place] [phone]      Consultations: Treatment Team:  Juanita Craver, MD  Procedures Performed:  EGD was done on 5/31  by Dr Ardis Hughs.   Admission HPI: Patient is a 57 y.o. male with a PMHx of continuous chronic alcohol abuse, hepatitis C (untreated), CKD1-2 (BL SCr 1-1.3), chronic combined systolic and diastolic CHF (LVEF [id number] with grade 1 diastolic dysfunction), and schizophrenia, who presents to [facility] for evaluation of rectal bleeding. The patient first experienced progressively worsening generalized weakness and positional lightheadedness on the morning of admission. He attempted to pass stools, and noted gross dark red blood in the toilet bowl that was not mixed with stools and without notable clots. He had approximately 6-7 episodes of the same bloody stools prior to ER evaluation. Per record review, it seems that these symptoms were preceded by LLQ and mid abdominal pain x approximately 2 weeks (for which he has been evaluated twice in the ED 5/21 and 5/26). This was thought likely secondary to acute  on presumed chronic pancreatitis exacerbated by ongoing alcohol abuse (lipase 76 and ETOH level > 300 on 5/26). Abd pain symptoms have since been persistent but not worse since the time of onset.  The patient has not previously had an EGD or colonoscopy. He does not indicate use of NSAIDs including aspiring, ibuprofen, Aleve, Goody powder. He has never previously experienced similar symptoms. He has abstained from alcohol x 4 days (with minimal withdrawal symptoms) - although has protracted history of severe alcohol abuse with multiple 5ths of liquor daily.    Hospital Course by problem list: 1) Upper GI bleeding with acute blood loss anemia: Hgb was 7.1 on admission (BL 14.8). GI was consulted. EGD w/ biopsy and EGD w/ control of bleeding were performed by Dr. Ardis Hughs on 5/31. Multiple ulcers in his stomach were found, with biggest one of 1.5 cm. It contained a clear  visible vessel within the crater. This was treated with epinephrine injection and endoclip placement. Biopsies were taken. Patient tolerated the procedure well. Hgb decreased from 8.8-->7.7 and back up to 8.1 after 1 U of blood. Trop neg X 3. GI continued pt on protonix gtt and then was transitioned to oral protonix 40mg  BID, a list of NSAIDs given to pt to avoid.    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 EF with DD grade 1 of 35 to 40% 02/2012. Home regimen includes Lasix, Lipitor, Imdur, and carvedilol. These were held and then restarted upon d/c.  3) Chronic abdominal pain: Etiology is unclear. Patient says that he has chronic pancreatitis and goes to pain clinic. He does not take chronic narcotics. There is demonstrable tenderness of the left side of his abdomen on admission. Abdominal pelvis CT scan on 03/04/2011 revealed acute pancreatitis, no calcifications were reported. He has mildly elevated lipase level for a  prolonged duration. Lipase 61<--76 (06/20/2012). Possiblly he has chronic pancreatitis given his alcohol history. No clinical indication for abdominal imaging during admission would f/u as outpt.   4) Thrombocytopenia: Baseline platelet count is normal. On admission platelet count of 112. Likely secondary to alcohol abuse and acute blood loss would f/u as outpt.   5) Hepatitis C infection: This is untreated. This can be addressed as outpatient.   6) Alcohol abuse: Patient reports that has not been drinking over the last 1 week since presentation to the ED. He admits to heavy previous drinking. His alcohol blood level of 319 on 06/15/2012. AST/ALT ratio consistent with alcohol abuse   7) Liver Cirrhosis: Secondary to alcohol abuse and hepatitis C. AST  of 61, ALT of 23 alkaline phosphatase of 45, albumin of 2.8. INR is 1.3. Abdominal ultrasound scan 12/2009 showed hepatic steatosis and more recently in 01/2012 features of cirrhosis on abdomen ultrasound scan. No clinical signs of chronic liver disease. Bleeding was confirmed by EGD due to gastric ulcers.   8)Schizophrenia: No acute psychosis. Patient reports that he goes to [facility] clinic. Currently not on any antipsychotic medications. However, he is on trazodone 100 mg at bedtime. Will continue to monitor for symptoms, and continue with as outpatient.   6) CKD 2: Stage 2 with BL creatine of 1-1.3. Currently stable. Cont to monitor as outpt.   7) Hypertension: stable. Home meds restarted at d/c.    Discharge Vitals:  BP 153/74  Pulse 66  Temp(Src) 98.9 F (37.2 C) (Oral)  Resp 20  Ht 5\' 11"  (1.803 m)  Wt 236 lb 11.2 oz (107.366 kg)  BMI 33.03 kg/m2  SpO2 100% General: Not in acute distress.  HEENT: PERRL, EOMI, no scleral icterus, No JVD or bruit  Cardiac: S1/S2, RRR, No murmurs, gallops or rubs  Pulm: Good air movement bilaterally. Clear to auscultation bilaterally. No rales, wheezing, rhonchi or rubs.  Abd: Soft,  nondistended, nttp, no rebound pain, no organomegaly, BS present  Ext: No edema. 2+DP/PT pulse bilaterally. Missing right big toe nail, no signs of infection.  Musculoskeletal: No joint deformities, erythema, or stiffness.  Skin: No rashes.  Neuro: Alert and oriented X3, cranial nerves II-XII grossly intact, muscle strength 5/5 in all extremeties, sensation to light touch intact. Brachial reflex 2+ bilaterally.   Discharge Labs:  Results for orders placed during the hospital encounter of 06/24/12 (from the past 24 hour(s))  CBC     Status: Abnormal   Collection Time    06/27/12  9:00 AM      Result Value Range   WBC 5.6  4.0 - 10.5 K/uL   RBC 2.88 (*) 4.22 - 5.81 MIL/uL   Hemoglobin 8.6 (*) 13.0 - 17.0 g/dL   HCT 25.5 (*) 39.0 - 52.0 %   MCV 88.5  78.0 - 100.0 fL   MCH 29.9  26.0 - 34.0 pg   MCHC 33.7  30.0 - 36.0 g/dL   RDW 14.8  11.5 - 15.5 %   Platelets 126 (*) 150 - 400 K/uL  BASIC METABOLIC PANEL     Status: Abnormal   Collection Time    06/27/12  9:00 AM      Result Value Range   Sodium 136  135 - 145 mEq/L   Potassium 3.8  3.5 - 5.1 mEq/L   Chloride 104  96 - 112 mEq/L   CO2 22  19 - 32 mEq/L   Glucose, Bld 100 (*) 70 - 99 mg/dL   BUN 12  6 - 23 mg/dL   Creatinine, Ser 0.73  0.50 - 1.35 mg/dL   Calcium 9.1  8.4 - 10.5 mg/dL   GFR calc non Af Amer >90  >90 mL/min   GFR calc Af Amer >90  >90 mL/min    Signed: Clinton Gallant 06/28/2012, 8:52 AM   Time Spent on Discharge: 35 min Services Ordered on Discharge: none Equipment Ordered on Discharge: none

## 2012-07-05 ENCOUNTER — Ambulatory Visit (HOSPITAL_COMMUNITY)
Admission: RE | Admit: 2012-07-05 | Discharge: 2012-07-05 | Disposition: A | Discharge status: Home / Self Care | Payer: PRIVATE HEALTH INSURANCE | Source: Ambulatory Visit | Attending: Internal Medicine | Admitting: Internal Medicine

## 2012-07-05 ENCOUNTER — Encounter (HOSPITAL_COMMUNITY): Payer: Self-pay

## 2012-07-05 VITALS — BP 110/62 | HR 62 | Wt 239.0 lb

## 2012-07-05 DIAGNOSIS — I10 Essential (primary) hypertension: Secondary | ICD-10-CM | POA: Insufficient documentation

## 2012-07-05 DIAGNOSIS — I509 Heart failure, unspecified: Secondary | ICD-10-CM | POA: Insufficient documentation

## 2012-07-05 DIAGNOSIS — I5042 Chronic combined systolic (congestive) and diastolic (congestive) heart failure: Secondary | ICD-10-CM | POA: Insufficient documentation

## 2012-07-05 MED ORDER — CLONIDINE HCL 0.2 MG PO TABS: 0.2000 mg | ORAL_TABLET | Freq: Every day | ORAL | Status: DC

## 2012-07-05 MED ORDER — CARVEDILOL 12.5 MG PO TABS: 12.5000 mg | ORAL_TABLET | Freq: Two times a day (BID) | ORAL | Status: DC

## 2012-07-05 NOTE — Patient Instructions (Addendum)
Follow up 6-8 weeks  Stop labetalol  Only take 0.2 mg clonidine daily  Take carvedilol 12.5 mg twice a day  Do the following things EVERYDAY: 1) Weigh yourself in the morning before breakfast. Write it down and keep it in a log. 2) Take your medicines as prescribed 3) Eat low salt foods-Limit salt (sodium) to 2000 mg per day.  4) Stay as active as you can everyday 5) Limit all fluids for the day to less than 2 liters

## 2012-07-05 NOTE — Assessment & Plan Note (Addendum)
NYHA II. Volume status stable. As noted will stop labetalol and increase carvedilol to 12.5 mg twice a day. Reinforced medication compliance, low salt food choices, and daily weights.   Patient seen and examined with Darrick Grinder, NP. We discussed all aspects of the encounter. I agree with the assessment and plan as stated above. Symptomatically much improved. Need to start cleaning up his HF regimen as described above. Will also begin to wean clonidine - decrease to 0.2 mg daily and eventually wean to off. As EF> 35% not candidate for ICD.

## 2012-07-05 NOTE — Assessment & Plan Note (Addendum)
BP stable. Begin to wean clonidine. Stop labetalol and increase carvedilol to 12.5 mg twice a day.   Attending: Agree with above. See HF section for full details.

## 2012-07-05 NOTE — Progress Notes (Signed)
Patient ID: [name] [name], male   DOB: 1955/03/19, 57 y.o.   MRN: [id number] Referring Physician: Dr. Silverio Decamp Primary Care: Internal Medicine clinic, Dr. Silverio Decamp Primary Cardiologist: Dr. Doylene Canard  GI: Dr Ardis Hughs.   HPI: [name] [name] is a 57 y.o. 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  History of substance abuse but quit smoking cigarettes 3 years ago after smoking 25 years, alcohol use but not any more as well as history of cocaine use.  Had history of acute renal failure in setting of rhabdo requiring short term HD.    Admitted with new onset HF in April 2014.  EF 35-40%.  He was diuresed and discharged home.  Discharge weight 256 pounds. Echo at the time showed EF 123456, grade 1 diastolic dysfunction.    Cath 04/27/12 [facility] RA = 4  RV = 45/3/4  PA = 42/9 (24)  PCW = 14  Fick cardiac output/index = 5.2/2.2  PVR = 1.9 [place]  SVR = 1656  FA sat = 96%  PA sat = 62%, 64%  Ao Pressure: 149/84 (11)  LV Pressure: 142/8/29  There was no signficant gradient across the aortic valve on pullback.  Arteries normal. LV gram EF= 35-40% global HK  Admitted to [facility] 5/30 through 06/27/12 with GI bleed. EGD 06/25/12 multiple ulcers noted. Continued on protonix.   He returns for follow up. Last visit he was given Metolazone and lasix was increased to 80 mg twice a day. Feels much better. Weighing daily. Denies SOB/PND/Orthopnea. [facility] follows. CNA follows 6 days a week for 3 hours at a time.   Review of Systems: All pertinent positives and negatives as in HPI, otherwise negative.    Past Medical History  Diagnosis Date  . GERD (gastroesophageal reflux disease)   . Schizophrenia, schizo-affective   . Cataract   . Hyperlipidemia   . Wears glasses   . Wears dentures     upper  . Hypertension     uncontrolled with medication noncompliance  . Hepatitis C   . HIT (heparin-induced thrombocytopenia)   . Continuous chronic  alcoholism   . H/O cocaine abuse   . Stroke 01/2012    Small cerebellar infarcts right greater than left as well as questionable acute left external capsule and caudate nuclear punctate lacunar infarcts noted per MRI (01/2012) - presumed to be embolic likely source PFO with right to left shunt (noted per TEE 01/ 2014)  . Chronic combined systolic and diastolic CHF (congestive heart failure) 02/29/2012    LV EF 40-45% per 2D echo (02/2012) with grade 1 diastolic dysfunction. 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 Degenerative lumbar spinal stenosis     s/p L2-3, L3-4, L4-5 laminectomy partial facetectomy, and bilateral foraminotomy  . History of pancreatitis 01/2011    Admission for acute pancreatitis presumed 2/2 ongoing alcohol abuse and hypertriglyceridemia  . PFO (patent foramen ovale) 01/2012    with right to left shunt, noted per TEE in evaluation for source of embolic stroke in [id number]  . CKD (chronic kidney disease) stage 2, GFR 60-89 ml/min     BL SCr approximately 1-1.3  . 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 Hepatic steatosis     suspected 2/2 alcohol abuse  . Splenic cyst   . Colitis 05/2009  History of colitis of ascending colon noted on CT abd/pelvis (05/2009), with interval resolution with subsequent CT  . Hypertriglyceridemia     Current Outpatient Prescriptions  Medication Sig Dispense Refill  . carvedilol (COREG) 6.25 MG tablet Take 6.25 mg by mouth 2 (two) times daily with a meal.      . cloNIDine (CATAPRES) 0.2 MG tablet Take 1 tablet (0.2 mg total) by mouth 2 (two) times daily.  60 tablet  1  . folic acid (FOLVITE) 1 MG tablet Take 1 tablet (1 mg total) by mouth daily.  30 tablet  1  . furosemide (LASIX) 40 MG tablet Take 2 tablets (80 mg total) by mouth 2 (two) times daily.  120 tablet  3  . isosorbide-hydrALAZINE (BIDIL) 20-37.5 MG  per tablet Take 2 tablets by mouth 3 (three) times daily.  90 tablet  1  . ketoconazole (NIZORAL) 2 % cream Apply 1 application topically daily.      Marland Kitchen labetalol (NORMODYNE) 100 MG tablet Take 1 tablet (100 mg total) by mouth 2 (two) times daily.  60 tablet  2  . lisinopril (PRINIVIL,ZESTRIL) 20 MG tablet Take 10-20 mg by mouth 2 (two) times daily. Take 20mg  in AM and 10mg  in PM      . ondansetron (ZOFRAN) 4 MG tablet Take 1 tablet (4 mg total) by mouth every 8 (eight) hours as needed for nausea.  20 tablet  0  . oxyCODONE-acetaminophen (ROXICET) 5-325 MG per tablet Take 1 tablet by mouth every 4 (four) hours as needed for pain.  30 tablet  0  . pantoprazole (PROTONIX) 40 MG tablet Take 1 tablet (40 mg total) by mouth 2 (two) times daily with a meal.  30 tablet  12  . potassium chloride (K-DUR,KLOR-[name]) 10 MEQ tablet Take 1 tablet (10 mEq total) by mouth 2 (two) times daily.  30 tablet  1  . rosuvastatin (CRESTOR) 20 MG tablet Take 1 tablet (20 mg total) by mouth daily.  30 tablet  3  . traZODone (DESYREL) 100 MG tablet Take 100 mg by mouth at bedtime.       No current facility-administered medications for this encounter.    Allergies  Allergen Reactions  . Thorazine (Chlorpromazine Hcl) Other (See Comments)    Body freezes up    PHYSICAL EXAM: Filed Vitals:   07/05/12 1304  BP: 110/62  Pulse: 62  Weight: 239 lb (108.41 kg)  SpO2: 99%    General:  Chronically ill appearing. No respiratory difficulty HEENT: normal Neck: supple. JVP 5-6.  Carotids 2+ bilat; no bruits. No lymphadenopathy or thryomegaly appreciated. Cor: PMI nondisplaced. Regular rate & rhythm. No rubs, gallops or murmurs. Lungs: clear Abdomen: soft, nontender, mild distention. No hepatosplenomegaly. No bruits or masses. Good bowel sounds. Extremities: no cyanosis, clubbing, rash, tr edema  Neuro: alert & oriented x 3, cranial nerves grossly intact. moves all 4 extremities w/o difficulty. Flat Affect    ASSESSMENT  & PLAN:

## 2012-07-06 ENCOUNTER — Other Ambulatory Visit: Payer: Self-pay

## 2012-07-06 MED ORDER — AMOXICILLIN 500 MG PO TABS: 1000.0000 mg | ORAL_TABLET | Freq: Two times a day (BID) | ORAL | Status: DC

## 2012-07-06 MED ORDER — CLARITHROMYCIN 500 MG PO TABS: 500.0000 mg | ORAL_TABLET | Freq: Two times a day (BID) | ORAL | Status: DC

## 2012-07-06 NOTE — Telephone Encounter (Signed)
Pt returned call after receiving the letter that was mailed, he was advised of the H pylori diagnosis and prescriptions were sent to his pharmacy.  Meds and allergies were reviewed.  He will contact Dr Benson Norway in 7-8 weeks for repeat procedure. Phone number was verified

## 2012-07-07 ENCOUNTER — Ambulatory Visit (INDEPENDENT_AMBULATORY_CARE_PROVIDER_SITE_OTHER): Payer: PRIVATE HEALTH INSURANCE | Admitting: Internal Medicine

## 2012-07-07 ENCOUNTER — Encounter: Payer: Self-pay | Admitting: Internal Medicine

## 2012-07-07 ENCOUNTER — Other Ambulatory Visit: Payer: Self-pay | Admitting: Internal Medicine

## 2012-07-07 VITALS — BP 133/80 | HR 58 | Temp 98.5°F | Ht 71.0 in | Wt 237.0 lb

## 2012-07-07 DIAGNOSIS — D649 Anemia, unspecified: Secondary | ICD-10-CM

## 2012-07-07 DIAGNOSIS — R972 Elevated prostate specific antigen [PSA]: Secondary | ICD-10-CM

## 2012-07-07 DIAGNOSIS — B9681 Helicobacter pylori [H. pylori] as the cause of diseases classified elsewhere: Secondary | ICD-10-CM

## 2012-07-07 DIAGNOSIS — Z1211 Encounter for screening for malignant neoplasm of colon: Secondary | ICD-10-CM

## 2012-07-07 DIAGNOSIS — F1120 Opioid dependence, uncomplicated: Secondary | ICD-10-CM

## 2012-07-07 DIAGNOSIS — F1920 Other psychoactive substance dependence, uncomplicated: Secondary | ICD-10-CM

## 2012-07-07 DIAGNOSIS — K259 Gastric ulcer, unspecified as acute or chronic, without hemorrhage or perforation: Secondary | ICD-10-CM

## 2012-07-07 DIAGNOSIS — B1920 Unspecified viral hepatitis C without hepatic coma: Secondary | ICD-10-CM

## 2012-07-07 DIAGNOSIS — R7402 Elevation of levels of lactic acid dehydrogenase (LDH): Secondary | ICD-10-CM

## 2012-07-07 LAB — HEPATIC FUNCTION PANEL
AST: 23 U/L (ref 0–37)
Albumin: 4.3 g/dL (ref 3.5–5.2)
Bilirubin, Direct: 0.1 mg/dL (ref 0.0–0.3)
Total Bilirubin: 0.3 mg/dL (ref 0.3–1.2)

## 2012-07-07 LAB — IBC PANEL
%SAT: 7 % — ABNORMAL LOW (ref 20–55)
TIBC: 314 ug/dL (ref 215–435)
UIBC: 291 ug/dL (ref 125–400)

## 2012-07-07 LAB — IRON: Iron: 23 ug/dL — ABNORMAL LOW (ref 42–165)

## 2012-07-07 LAB — CBC
Hemoglobin: 9.6 g/dL — ABNORMAL LOW (ref 13.0–17.0)
MCHC: 32.5 g/dL (ref 30.0–36.0)
Platelets: 396 10*3/uL (ref 150–400)
RBC: 3.36 MIL/uL — ABNORMAL LOW (ref 4.22–5.81)

## 2012-07-07 MED ORDER — OXYCODONE-ACETAMINOPHEN 10-325 MG PO TABS: 1.0000 | ORAL_TABLET | Freq: Four times a day (QID) | ORAL | Status: DC | PRN

## 2012-07-07 MED ORDER — ATORVASTATIN CALCIUM 40 MG PO TABS: 40.0000 mg | ORAL_TABLET | Freq: Every day | ORAL | Status: DC

## 2012-07-07 NOTE — Patient Instructions (Addendum)
Referred you to Urology for your prostate. Referred you to Gastroenterology to have your colonoscopy done. Urine drug test today. Blood work today.

## 2012-07-07 NOTE — Progress Notes (Signed)
  Subjective:    Patient ID: [name] [name], male    DOB: 1955/06/25, 57 y.o.   MRN: [id number]  HPI Following with Dr. Haroldine Laws.  He recently was admitted with GI bleeding and found to have an upper GI source. He is H. Pylori positive, and is currently going to start therapy for this. He is aware to not use NSAIDs.  Denies bleeding.  Denies CP. States his breathing is improved. He states he has an aid that helps out.  He is trying to increase his activity level. No complaints for me today.  Review of Systems Complete 12 point ROS otherwise negative except for that stated in the HPI.    Objective:   Physical Exam Filed Vitals:   07/07/12 1028  BP: 133/80  Pulse: 58  Temp: 98.5 F (36.9 C)   GEN: AAOx3, NAD. HEENT: EOMI, PERRL, no icterus, no adenopathy. CV: S1S2, no m/r/g, RRR. PULM: CTA bilat. ABD/GI: Soft, NT, +BS, no distention, no palpable masses. LE/UE: 2/4 pulses, no c/c/e. NEURO: CN II-XII intact, no focal deficits.     Assessment & Plan:  1 yr. Old AAM with hx HTN, schizophrenia, hx cerebellar/left brain stem CVA, hx substance abuse including cocaine and tobacco, hx rhabdomyolysis, chronic systolic heart failure (EF 35-40%) followed at HF clinic, presents for follow up.  1) Chronic systolic HF: Euvolemic today. Follows with HF clinic.  2) HTN: Controlled.  3) Schizophrenia: Stable on current regimen.  4) HL: reviewed lipid panel from 01/2012. 10 year risk of CV event is 12.4%. Rosuvastatin 20 mg daily, but is unable to afford, he is requesting a change. I will give him Lipitor 40 mg daily. 5) Substance abuse: no longer using cocaine or TOB, educated.  6) Health maintenance: Pneumovax UTD.DTap UTD. Marland Kitchen His PSA was elevated, due to the PSA test he opted to have, and I referred him to urology. He was not able to go, I will ask that his appointment be set up again. I educated him on risks and benefits and current recommendations based on evidence. Colonoscopy referral  today. 7) Arthritis of Left shoulder: Cannot give NSAIDs due to hx gastric ulcer and GI bleeding. He states Roxicet helps, but will need a slightly higher dose. He states he is no longer using illicit drugs and has quit drinking. I will obtain urine drug test today, he understands he cannot use illicit drugs and receive narcotics. 8) Recent GI bleed: recheck CBC and iron panel. Asymptomatic.  Dominic Pea

## 2012-07-07 NOTE — Progress Notes (Signed)
Patient ID: [name] [name], male   DOB: [birth year], 57 y.o.   MRN: [id number]  Received notice that this patient has recently filled a narcotic prescription from Dr. Alvino Chapel on 6/9 for Norco #30.  Review of [place] narcotic database report shows that he has filled Rx's for Hydrocodone/oxycodone from 5 different prescribers and has 4 different addresses listed. He did not tell me this today. I have instructed the pharmacy not to fill anymore narcotics. A urine drug screen has been obtained and pending. Diversion concern.

## 2012-07-08 LAB — PRESCRIPTION ABUSE MONITORING 15P, URINE
Benzodiazepine Screen, Urine: NEGATIVE ng/mL
Carisoprodol, Urine: NEGATIVE ng/mL
Creatinine, Urine: 233.92 mg/dL (ref >20.0)
Fentanyl, Ur: NEGATIVE ng/mL
Oxycodone Screen, Ur: NEGATIVE ng/mL
Propoxyphene: NEGATIVE ng/mL

## 2012-07-09 LAB — BARBITURATES (GC/LC/MS), URINE
Butalbital: NEGATIVE ng/mL
Pentabarbital: NEGATIVE ng/mL
Secobarbital: NEGATIVE ng/mL

## 2012-07-11 LAB — COCAINE METABOLITE (GC/LC/MS), URINE: Benzoylecgonine GC/MS Conf: 1721 ng/mL — ABNORMAL HIGH

## 2012-07-11 LAB — OPIATES/OPIOIDS (LC/MS-MS)
Heroin (6-AM), UR: NEGATIVE ng/mL
Hydrocodone: 205 ng/mL — ABNORMAL HIGH
Morphine Urine: NEGATIVE ng/mL
Noroxycodone, Ur: NEGATIVE ng/mL
Oxycodone, ur: NEGATIVE ng/mL
Oxymorphone: NEGATIVE ng/mL

## 2012-07-14 ENCOUNTER — Emergency Department (HOSPITAL_COMMUNITY)
Admission: EM | Admit: 2012-07-14 | Discharge: 2012-07-14 | Disposition: A | Discharge status: Home / Self Care | Payer: PRIVATE HEALTH INSURANCE | Attending: Emergency Medicine | Admitting: Emergency Medicine

## 2012-07-14 ENCOUNTER — Emergency Department (HOSPITAL_COMMUNITY): Payer: PRIVATE HEALTH INSURANCE

## 2012-07-14 ENCOUNTER — Encounter (HOSPITAL_COMMUNITY): Payer: Self-pay | Admitting: Emergency Medicine

## 2012-07-14 DIAGNOSIS — Z8619 Personal history of other infectious and parasitic diseases: Secondary | ICD-10-CM | POA: Insufficient documentation

## 2012-07-14 DIAGNOSIS — E785 Hyperlipidemia, unspecified: Secondary | ICD-10-CM | POA: Insufficient documentation

## 2012-07-14 DIAGNOSIS — Z8673 Personal history of transient ischemic attack (TIA), and cerebral infarction without residual deficits: Secondary | ICD-10-CM | POA: Insufficient documentation

## 2012-07-14 DIAGNOSIS — A048 Other specified bacterial intestinal infections: Secondary | ICD-10-CM | POA: Insufficient documentation

## 2012-07-14 DIAGNOSIS — K259 Gastric ulcer, unspecified as acute or chronic, without hemorrhage or perforation: Secondary | ICD-10-CM | POA: Insufficient documentation

## 2012-07-14 DIAGNOSIS — I129 Hypertensive chronic kidney disease with stage 1 through stage 4 chronic kidney disease, or unspecified chronic kidney disease: Secondary | ICD-10-CM | POA: Insufficient documentation

## 2012-07-14 DIAGNOSIS — Z8739 Personal history of other diseases of the musculoskeletal system and connective tissue: Secondary | ICD-10-CM | POA: Insufficient documentation

## 2012-07-14 DIAGNOSIS — Z8719 Personal history of other diseases of the digestive system: Secondary | ICD-10-CM | POA: Insufficient documentation

## 2012-07-14 DIAGNOSIS — Z79899 Other long term (current) drug therapy: Secondary | ICD-10-CM | POA: Insufficient documentation

## 2012-07-14 DIAGNOSIS — Z862 Personal history of diseases of the blood and blood-forming organs and certain disorders involving the immune mechanism: Secondary | ICD-10-CM | POA: Insufficient documentation

## 2012-07-14 DIAGNOSIS — Z8669 Personal history of other diseases of the nervous system and sense organs: Secondary | ICD-10-CM | POA: Insufficient documentation

## 2012-07-14 DIAGNOSIS — K219 Gastro-esophageal reflux disease without esophagitis: Secondary | ICD-10-CM | POA: Insufficient documentation

## 2012-07-14 DIAGNOSIS — F1021 Alcohol dependence, in remission: Secondary | ICD-10-CM | POA: Insufficient documentation

## 2012-07-14 DIAGNOSIS — Z8659 Personal history of other mental and behavioral disorders: Secondary | ICD-10-CM | POA: Insufficient documentation

## 2012-07-14 DIAGNOSIS — N182 Chronic kidney disease, stage 2 (mild): Secondary | ICD-10-CM | POA: Insufficient documentation

## 2012-07-14 DIAGNOSIS — E781 Pure hyperglyceridemia: Secondary | ICD-10-CM | POA: Insufficient documentation

## 2012-07-14 DIAGNOSIS — Z9889 Other specified postprocedural states: Secondary | ICD-10-CM | POA: Insufficient documentation

## 2012-07-14 DIAGNOSIS — R109 Unspecified abdominal pain: Secondary | ICD-10-CM

## 2012-07-14 DIAGNOSIS — I5042 Chronic combined systolic (congestive) and diastolic (congestive) heart failure: Secondary | ICD-10-CM | POA: Insufficient documentation

## 2012-07-14 DIAGNOSIS — Z87891 Personal history of nicotine dependence: Secondary | ICD-10-CM | POA: Insufficient documentation

## 2012-07-14 LAB — URINE MICROSCOPIC-ADD ON

## 2012-07-14 LAB — COMPREHENSIVE METABOLIC PANEL
ALT: 33 U/L (ref 0–53)
BUN: 12 mg/dL (ref 6–23)
CO2: 27 mEq/L (ref 19–32)
Calcium: 9.7 mg/dL (ref 8.4–10.5)
Creatinine, Ser: 0.98 mg/dL (ref 0.50–1.35)
GFR calc Af Amer: >90 mL/min (ref >90)
GFR calc non Af Amer: 89 mL/min — ABNORMAL LOW (ref >90)
Glucose, Bld: 106 mg/dL — ABNORMAL HIGH (ref 70–99)
Sodium: 135 mEq/L (ref 135–145)
Total Protein: 7.5 g/dL (ref 6.0–8.3)

## 2012-07-14 LAB — CBC WITH DIFFERENTIAL/PLATELET
Basophils Relative: 0 % (ref 0–1)
HCT: 34.2 % — ABNORMAL LOW (ref 39.0–52.0)
Hemoglobin: 11.1 g/dL — ABNORMAL LOW (ref 13.0–17.0)
Lymphocytes Relative: 19 % (ref 12–46)
MCHC: 32.5 g/dL (ref 30.0–36.0)
Monocytes Absolute: 0.8 10*3/uL (ref 0.1–1.0)
Monocytes Relative: 8 % (ref 3–12)
Neutro Abs: 7.2 10*3/uL (ref 1.7–7.7)
Neutrophils Relative %: 71 % (ref 43–77)
RBC: 3.89 MIL/uL — ABNORMAL LOW (ref 4.22–5.81)
WBC: 10 10*3/uL (ref 4.0–10.5)

## 2012-07-14 LAB — URINALYSIS, ROUTINE W REFLEX MICROSCOPIC
Hgb urine dipstick: NEGATIVE
Nitrite: NEGATIVE
Specific Gravity, Urine: 1.026 (ref 1.005–1.030)
Urobilinogen, UA: 0.2 mg/dL (ref 0.0–1.0)
pH: 6 (ref 5.0–8.0)

## 2012-07-14 MED ORDER — OXYCODONE-ACETAMINOPHEN 5-325 MG PO TABS
2.0000 | ORAL_TABLET | Freq: Once | ORAL | Status: AC
  Administered 2012-07-14: 2 via ORAL
  Filled 2012-07-14: qty 2

## 2012-07-14 MED ORDER — SODIUM CHLORIDE 0.9 % IV BOLUS (SEPSIS)
1000.0000 mL | Freq: Once | INTRAVENOUS | Status: AC
  Administered 2012-07-14: 1000 mL via INTRAVENOUS

## 2012-07-14 NOTE — ED Notes (Signed)
Pt here for right flank and back pain x 5 days; pt sts worse when inspiraton

## 2012-07-14 NOTE — ED Provider Notes (Signed)
History     CSN: [id number]  Arrival date & time 07/14/12  1715   First MD Initiated Contact with Patient 07/14/12 1827      Chief Complaint  Patient presents with  . Flank Pain    (Consider location/radiation/quality/duration/timing/severity/associated sxs/prior treatment) HPI Comments: 57 y.o. Male with recent dx of H. Pylori for gastric ulcer (taking amoxicillin, clarithromycin, pantoprazole), pancreatitis, CKD (stage 2), Hepatitis C, GERD, ETOH abuse, schizophrenia, CHF, HTN, presents today with right flank pain for the last 5 or 6 days when he started taking abx/PPI for h. Pyolori. Pt describes pain as dull, throbbing, worse with movement, worse with inspiration.  Pain does not radiate. Pain is bilateral (R>L). Pt rates pain 10/10. Pt has taken no interventions.   Denies fever, nausea, vomiting, chest pain.   Pt is followed by Dr. Murlean Caller (PCP) and Dr. Haroldine Laws.   Patient is a 57 y.o. male presenting with flank pain.  Flank Pain Pertinent negatives include no chest pain, diaphoresis, fever, headaches, nausea, neck pain, numbness, rash, vomiting or weakness.    Past Medical History  Diagnosis Date  . GERD (gastroesophageal reflux disease)   . Schizophrenia, schizo-affective   . Cataract   . Hyperlipidemia   . Wears glasses   . Wears dentures     upper  . Hypertension     uncontrolled with medication noncompliance  . Hepatitis C   . HIT (heparin-induced thrombocytopenia)   . Continuous chronic alcoholism   . H/O cocaine abuse   . Stroke 01/2012    Small cerebellar infarcts right greater than left as well as questionable acute left external capsule and caudate nuclear punctate lacunar infarcts noted per MRI (01/2012) - presumed to be embolic likely source PFO with right to left shunt (noted per TEE 01/ 2014)  . Chronic combined systolic and diastolic CHF (congestive heart failure) 02/29/2012    LV EF 40-45% per 2D echo (02/2012) with grade 1 diastolic dysfunction. Presumed  to be 2/2 NICM in setting of dilated cardiomyopathy due to alcohol abuse and uncontrolled HTN  . Degenerative lumbar spinal stenosis     s/p L2-3, L3-4, L4-5 laminectomy partial facetectomy, and bilateral foraminotomy  . History of pancreatitis 01/2011    Admission for acute pancreatitis presumed 2/2 ongoing alcohol abuse and hypertriglyceridemia  . PFO (patent foramen ovale) 01/2012    with right to left shunt, noted per TEE in evaluation for source of embolic stroke in [id number]  . CKD (chronic kidney disease) stage 2, GFR 60-89 ml/min     BL SCr approximately 1-1.3  . 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 Hepatic steatosis     suspected 2/2 alcohol abuse  . Splenic cyst   . Colitis 05/2009    History of colitis of ascending colon noted on CT abd/pelvis (05/2009), with interval resolution with subsequent CT  . Hypertriglyceridemia     Past Surgical History  Procedure Laterality Date  . L2-3, l3-4, l4-5 laminectomy, partial facetectomy    . Left achilles  2007  . Tonsillectomy    . Knee arthroscopy    . I&d extremity  03/20/2011    Procedure: IRRIGATION AND DEBRIDEMENT EXTREMITY;  Surgeon: Kerin Salen, MD;  Location: [facility];  Service: Orthopedics;  Laterality: Left;  I&D LEFT ACHILLIES TENDON  . Eye surgery      right eye  . Resection distal clavical  09/17/2011    Procedure: RESECTION DISTAL CLAVICAL;  Surgeon: Larkin Ina  Caffie Damme, MD;  Location: [facility];  Service: Orthopedics;  Laterality: Right;  right shoulder arthroscopy with sad and open distal clavicle excision   . Esophagogastroduodenoscopy N/A 06/25/2012    Procedure: ESOPHAGOGASTRODUODENOSCOPY (EGD);  Surgeon: Milus Banister, MD;  Location: [facility];  Service: Endoscopy;  Laterality: N/A;    Family History  Problem Relation Age of Onset  . CAD Mother 76  . CAD Sister   . CAD Brother 27    died from MI at age 24yo  . Hypertension       History  Substance Use Topics  . Smoking status: Former Smoker -- 0.50 packs/day for 30 years    Types: Cigarettes    Quit date: 06/24/2001  . Smokeless tobacco: Not on file  . Alcohol Use: Yes     Comment: frequently-- 1/5th a day, also drinks about a bottle of wine daily       Review of Systems  Constitutional: Negative for fever and diaphoresis.  HENT: Negative for neck pain and neck stiffness.   Eyes: Negative for visual disturbance.  Respiratory: Negative for apnea, chest tightness and shortness of breath.   Cardiovascular: Negative for chest pain and palpitations.  Gastrointestinal: Negative for nausea, vomiting, diarrhea and constipation.  Genitourinary: Positive for flank pain. Negative for dysuria.       Bilateral flank pain R>L  Musculoskeletal: Negative for gait problem.  Skin: Negative for rash.  Neurological: Negative for dizziness, weakness, light-headedness, numbness and headaches.    Allergies  Thorazine  Home Medications   Current Outpatient Rx  Name  Route  Sig  Dispense  Refill  . amoxicillin (AMOXIL) 500 MG tablet   Oral   Take 2 tablets (1,000 mg total) by mouth 2 (two) times daily.   56 tablet   0   . atorvastatin (LIPITOR) 40 MG tablet   Oral   Take 1 tablet (40 mg total) by mouth daily.   30 tablet   3   . carvedilol (COREG) 12.5 MG tablet   Oral   Take 1 tablet (12.5 mg total) by mouth 2 (two) times daily with a meal.   60 tablet   3   . clarithromycin (BIAXIN) 500 MG tablet   Oral   Take 1 tablet (500 mg total) by mouth 2 (two) times daily.   28 tablet   0   . cloNIDine (CATAPRES) 0.2 MG tablet   Oral   Take 1 tablet (0.2 mg total) by mouth daily.   30 tablet   6   . folic acid (FOLVITE) 1 MG tablet   Oral   Take 1 tablet (1 mg total) by mouth daily.   30 tablet   1   . furosemide (LASIX) 40 MG tablet   Oral   Take 2 tablets (80 mg total) by mouth 2 (two) times daily.   120 tablet   3   .  isosorbide-hydrALAZINE (BIDIL) 20-37.5 MG per tablet   Oral   Take 2 tablets by mouth 3 (three) times daily.   90 tablet   1   . ketoconazole (NIZORAL) 2 % cream   Topical   Apply 1 application topically daily.         Marland Kitchen lisinopril (PRINIVIL,ZESTRIL) 20 MG tablet   Oral   Take 10-20 mg by mouth 2 (two) times daily. Take 20mg  in AM and 10mg  in PM         . ondansetron (ZOFRAN) 4 MG tablet   Oral  Take 1 tablet (4 mg total) by mouth every 8 (eight) hours as needed for nausea.   20 tablet   0   . pantoprazole (PROTONIX) 40 MG tablet   Oral   Take 1 tablet (40 mg total) by mouth 2 (two) times daily with a meal.   30 tablet   12   . potassium chloride (K-DUR,KLOR-CON) 10 MEQ tablet   Oral   Take 1 tablet (10 mEq total) by mouth 2 (two) times daily.   30 tablet   1   . traZODone (DESYREL) 100 MG tablet   Oral   Take 100 mg by mouth at bedtime.           BP 188/96  Pulse 68  Temp(Src) 98 F (36.7 C) (Oral)  Resp 18  SpO2 99%  Physical Exam  Nursing note and vitals reviewed. Constitutional: He is oriented to person, place, and time. He appears well-developed and well-nourished. No distress.  HENT:  Head: Normocephalic and atraumatic.  Eyes: Conjunctivae and EOM are normal.  Neck: Normal range of motion. Neck supple.  No meningeal signs  Cardiovascular: Normal rate, regular rhythm and normal heart sounds.  Exam reveals no gallop and no friction rub.   No murmur heard. Pulmonary/Chest: Effort normal and breath sounds normal. No respiratory distress. He has no wheezes. He has no rales. He exhibits no tenderness.  Abdominal: Soft. Bowel sounds are normal. He exhibits no distension. There is no tenderness. There is CVA tenderness. There is no rebound and no guarding.  Right CVA tenderness  Musculoskeletal: Normal range of motion. He exhibits no edema and no tenderness.  Neurological: He is alert and oriented to person, place, and time. No cranial nerve deficit.   Skin: Skin is warm and dry. He is not diaphoretic. No erythema.  Psychiatric: He has a normal mood and affect.    ED Course  Procedures (including critical care time)  Labs Reviewed  COMPREHENSIVE METABOLIC PANEL - Abnormal; Notable for the following:    Glucose, Bld 106 (*)    Total Bilirubin 0.2 (*)    GFR calc non Af Amer 89 (*)    All other components within normal limits  CBC WITH DIFFERENTIAL - Abnormal; Notable for the following:    RBC 3.89 (*)    Hemoglobin 11.1 (*)    HCT 34.2 (*)    RDW 15.8 (*)    All other components within normal limits  URINALYSIS, ROUTINE W REFLEX MICROSCOPIC   Dg Chest 2 View  07/14/2012   *RADIOLOGY REPORT*  Clinical Data: Chest pain and short of breath.  Flank pain  CHEST - 2 VIEW  Comparison: 05/17/2012  Findings: Prominent heart without heart failure.  Negative for pneumonia or effusion.  Lungs are clear.  IMPRESSION: No acute abnormality.   Original Report Authenticated By: Carl Best, M.D.   US Renal  07/14/2012   *RADIOLOGY REPORT*  Clinical Data:  Right greater than left bilateral flank pain  RENAL/URINARY TRACT ULTRASOUND COMPLETE  Comparison:  Prior CT scan of the abdomen and pelvis 03/04/2011  Findings:  Right Kidney:  Normal in size (11.7 cm) and parenchymal echogenicity.  No evidence of mass or hydronephrosis.  Left Kidney:  Normal in size (12 cm) and parenchymal echogenicity. No evidence of mass or hydronephrosis.  Bladder:  Appears normal for degree of bladder distention.  Other:  The hepatic parenchyma is diffusely echogenic and there is coarsening of the echotexture consistent with at least moderate hepatic steatosis.  Septated low  attenuation cystic structure in the splenic hilum measures 5.9 x 6.1 by 6 cm.  Correlation with the prior CT scan of the abdomen and pelvis demonstrates similar findings.  IMPRESSION:  1.  Unremarkable sonographic appearance of the kidneys. 2.  Advanced hepatic steatosis. 3.  Septated versus lobulated cyst  within the splenic hilum does not appear significantly changed when comparing across modalities to the prior CT scan of the abdomen pelvis from February 2013.   Original Report Authenticated By: Jacqulynn Cadet, M.D.     1. Right flank pain       MDM  BUN/Cr WNL, vitals sign stable, no nausea, no vomiting. US renal shows no evidence of kidney stone. UA is negative for hematuria.  Previous CT scans show several instances of calcified, cystic splenic lesions are that were unchanged as well as fatty liver. Will discuss with pt the importance of follow up.  Review of notes show that pt has been to several different providers for narcotics. Will manage pain in the ED, but not prescribe any pain narcotics to go. Discussed pt case with Dr. Eulis Foster who is agreeable to pt discharge with appropriate follow up. Discussed reasons to seek immediate care. Patient expresses understanding and agrees with plan.     Coralee North, PA-C 07/15/12 [phone]

## 2012-07-15 ENCOUNTER — Emergency Department (HOSPITAL_COMMUNITY): Payer: PRIVATE HEALTH INSURANCE

## 2012-07-15 ENCOUNTER — Telehealth: Payer: Self-pay | Admitting: *Deleted

## 2012-07-15 ENCOUNTER — Encounter (HOSPITAL_COMMUNITY): Payer: Self-pay | Admitting: *Deleted

## 2012-07-15 ENCOUNTER — Emergency Department (HOSPITAL_COMMUNITY)
Admission: EM | Admit: 2012-07-15 | Discharge: 2012-07-16 | Disposition: A | Discharge status: Home / Self Care | Payer: PRIVATE HEALTH INSURANCE | Attending: Emergency Medicine | Admitting: Emergency Medicine

## 2012-07-15 DIAGNOSIS — I5042 Chronic combined systolic (congestive) and diastolic (congestive) heart failure: Secondary | ICD-10-CM | POA: Insufficient documentation

## 2012-07-15 DIAGNOSIS — R110 Nausea: Secondary | ICD-10-CM | POA: Insufficient documentation

## 2012-07-15 DIAGNOSIS — R05 Cough: Secondary | ICD-10-CM | POA: Insufficient documentation

## 2012-07-15 DIAGNOSIS — F259 Schizoaffective disorder, unspecified: Secondary | ICD-10-CM | POA: Insufficient documentation

## 2012-07-15 DIAGNOSIS — R109 Unspecified abdominal pain: Secondary | ICD-10-CM | POA: Insufficient documentation

## 2012-07-15 DIAGNOSIS — N182 Chronic kidney disease, stage 2 (mild): Secondary | ICD-10-CM | POA: Insufficient documentation

## 2012-07-15 DIAGNOSIS — Z8739 Personal history of other diseases of the musculoskeletal system and connective tissue: Secondary | ICD-10-CM | POA: Insufficient documentation

## 2012-07-15 DIAGNOSIS — Z8673 Personal history of transient ischemic attack (TIA), and cerebral infarction without residual deficits: Secondary | ICD-10-CM | POA: Insufficient documentation

## 2012-07-15 DIAGNOSIS — I129 Hypertensive chronic kidney disease with stage 1 through stage 4 chronic kidney disease, or unspecified chronic kidney disease: Secondary | ICD-10-CM | POA: Insufficient documentation

## 2012-07-15 DIAGNOSIS — R059 Cough, unspecified: Secondary | ICD-10-CM | POA: Insufficient documentation

## 2012-07-15 DIAGNOSIS — Z8719 Personal history of other diseases of the digestive system: Secondary | ICD-10-CM | POA: Insufficient documentation

## 2012-07-15 DIAGNOSIS — Z87891 Personal history of nicotine dependence: Secondary | ICD-10-CM | POA: Insufficient documentation

## 2012-07-15 DIAGNOSIS — Z79899 Other long term (current) drug therapy: Secondary | ICD-10-CM | POA: Insufficient documentation

## 2012-07-15 DIAGNOSIS — E781 Pure hyperglyceridemia: Secondary | ICD-10-CM | POA: Insufficient documentation

## 2012-07-15 DIAGNOSIS — Z8669 Personal history of other diseases of the nervous system and sense organs: Secondary | ICD-10-CM | POA: Insufficient documentation

## 2012-07-15 DIAGNOSIS — Z9889 Other specified postprocedural states: Secondary | ICD-10-CM | POA: Insufficient documentation

## 2012-07-15 LAB — COMPREHENSIVE METABOLIC PANEL
CO2: 28 mEq/L (ref 19–32)
Calcium: 9.9 mg/dL (ref 8.4–10.5)
Creatinine, Ser: 0.88 mg/dL (ref 0.50–1.35)
GFR calc Af Amer: >90 mL/min (ref >90)
GFR calc non Af Amer: >90 mL/min (ref >90)
Glucose, Bld: 156 mg/dL — ABNORMAL HIGH (ref 70–99)
Total Protein: 7.1 g/dL (ref 6.0–8.3)

## 2012-07-15 LAB — CBC WITH DIFFERENTIAL/PLATELET
Eosinophils Absolute: 0.3 10*3/uL (ref 0.0–0.7)
Eosinophils Relative: 3 % (ref 0–5)
HCT: 32.6 % — ABNORMAL LOW (ref 39.0–52.0)
Lymphocytes Relative: 20 % (ref 12–46)
Lymphs Abs: 1.8 10*3/uL (ref 0.7–4.0)
MCH: 28.5 pg (ref 26.0–34.0)
MCV: 88.6 fL (ref 78.0–100.0)
Monocytes Absolute: 1 10*3/uL (ref 0.1–1.0)
RBC: 3.68 MIL/uL — ABNORMAL LOW (ref 4.22–5.81)
RDW: 15.6 % — ABNORMAL HIGH (ref 11.5–15.5)
WBC: 9.3 10*3/uL (ref 4.0–10.5)

## 2012-07-15 LAB — URINALYSIS, ROUTINE W REFLEX MICROSCOPIC
Bilirubin Urine: NEGATIVE
Glucose, UA: NEGATIVE mg/dL
Hgb urine dipstick: NEGATIVE
Protein, ur: 100 mg/dL — AB
Urobilinogen, UA: 1 mg/dL (ref 0.0–1.0)

## 2012-07-15 LAB — URINE MICROSCOPIC-ADD ON

## 2012-07-15 LAB — OCCULT BLOOD, POC DEVICE: Fecal Occult Bld: NEGATIVE

## 2012-07-15 MED ORDER — ONDANSETRON 4 MG PO TBDP
8.0000 mg | ORAL_TABLET | Freq: Once | ORAL | Status: AC
  Administered 2012-07-15: 8 mg via ORAL
  Filled 2012-07-15: qty 2

## 2012-07-15 MED ORDER — IOHEXOL 300 MG/ML  SOLN
100.0000 mL | Freq: Once | INTRAMUSCULAR | Status: AC | PRN
  Administered 2012-07-15: 100 mL via INTRAVENOUS

## 2012-07-15 MED ORDER — ONDANSETRON HCL 8 MG PO TABS: 8.0000 mg | ORAL_TABLET | Freq: Three times a day (TID) | ORAL | Status: DC | PRN

## 2012-07-15 MED ORDER — MORPHINE SULFATE 4 MG/ML IJ SOLN
4.0000 mg | Freq: Once | INTRAMUSCULAR | Status: AC
  Administered 2012-07-15: 4 mg via INTRAVENOUS
  Filled 2012-07-15: qty 1

## 2012-07-15 MED ORDER — IOHEXOL 300 MG/ML  SOLN
25.0000 mL | Freq: Once | INTRAMUSCULAR | Status: AC | PRN
  Administered 2012-07-15: 25 mL via ORAL

## 2012-07-15 MED ORDER — OXYCODONE-ACETAMINOPHEN 5-325 MG PO TABS
2.0000 | ORAL_TABLET | Freq: Once | ORAL | Status: AC
  Administered 2012-07-15: 2 via ORAL
  Filled 2012-07-15: qty 2

## 2012-07-15 NOTE — Telephone Encounter (Signed)
Have him follow up in clinic next week, unfortunately I am not in clinic next week. Thanks.
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
## 2012-07-15 NOTE — Telephone Encounter (Addendum)
Pt called stating he went to ED last night for pain, bil left and rt side. Xrays were done and meds for pain given.  He is to f/u with [place].  R/o gall stones and kidney stones.  Today pt rates pain 8/10.  No urinary pain, Some SOB. This is new pain for him. Onset of pain 7 days ago.  He started amoxacillin and Biaxin on 6/11 for H pyolori  Pt # [id number]

## 2012-07-15 NOTE — ED Notes (Signed)
abd  And flank pain bi-laterally for 9 days.  The pt was seen here last pm and worked up for this pain.  His pain continues

## 2012-07-15 NOTE — Telephone Encounter (Signed)
Pt given an appointment for Tuesday AM.  Instructed to return to ED for any changes or increase pain.

## 2012-07-15 NOTE — ED Notes (Signed)
Pt. Returned from CT.

## 2012-07-16 NOTE — ED Provider Notes (Signed)
Medical screening examination/treatment/procedure(s) were performed by non-physician practitioner and as supervising physician I was immediately available for consultation/collaboration.  Richarda Blade, MD 07/16/12 [phone]

## 2012-07-17 NOTE — ED Provider Notes (Signed)
Medical screening examination/treatment/procedure(s) were performed by non-physician practitioner and as supervising physician I was immediately available for consultation/collaboration.  Leota Jacobsen, MD 07/17/12 [phone]

## 2012-07-18 ENCOUNTER — Telehealth: Payer: Self-pay | Admitting: *Deleted

## 2012-07-18 NOTE — Telephone Encounter (Signed)
Pt called clinic - past 12 days having upper back pain with nausea. Has appt 07/19/12 9:15AM Dr Nicoletta Dress. Offered appt today at 3PM - would have to ride the bus. Has made arrangement for transportation  07/19/12. Too early to renew pain med per pharmacy. Pt states can get into a comfortable position till appt time. Hilda Blades Altheria Shadoan RN 07/18/12 1:40PM

## 2012-07-19 ENCOUNTER — Encounter: Payer: Self-pay | Admitting: Internal Medicine

## 2012-07-19 ENCOUNTER — Ambulatory Visit (INDEPENDENT_AMBULATORY_CARE_PROVIDER_SITE_OTHER): Payer: PRIVATE HEALTH INSURANCE | Admitting: Internal Medicine

## 2012-07-19 VITALS — BP 113/72 | HR 59 | Temp 98.0°F | Ht 71.0 in | Wt 243.6 lb

## 2012-07-19 DIAGNOSIS — G8929 Other chronic pain: Secondary | ICD-10-CM | POA: Insufficient documentation

## 2012-07-19 DIAGNOSIS — M549 Dorsalgia, unspecified: Secondary | ICD-10-CM

## 2012-07-19 DIAGNOSIS — M546 Pain in thoracic spine: Secondary | ICD-10-CM

## 2012-07-19 MED ORDER — ACETAMINOPHEN 325 MG PO TABS: 650.0000 mg | ORAL_TABLET | Freq: Four times a day (QID) | ORAL | Status: DC | PRN

## 2012-07-19 NOTE — Patient Instructions (Addendum)
1. Tylenol 650 mg po every 6 hours as needed for pain x 2 weeks 2. Heat pads. 3. Call if symptoms worsen or feeling weakness, numbness or tingling. Call if you have bowel or urine incontinence. 4. Consider physical therapy referral if not better in 2 weeks.

## 2012-07-19 NOTE — Assessment & Plan Note (Addendum)
Patient presents with 2 weeks of acute onset mid to lower back pain.  Associated symptoms include generalized abdominal pain which is at his baseline. Physical examination reveals paraspinal tenderness to palpation.  SLRT negative. No neurological deficit. Right sided abdomen mild tenderness with guarding unless distracted. He had extensive workup at ED recently due to associated chronic abdominal pain.  No acute pathology was noted.    The clinical manifestation is consistent with musculoskeletal pain. HGB is at baseline with negative FOBT, US renal shows no evidence of kidney stone. UA is negative for hematuria. CT scans reveals no acute pathology.  Given his history of of multiple providers for narcotics and substance abuse ( Cocaine), will not prescribe any narcotics. Will avoid NSAIDs due to recent GIB 2/2 multiple gastric ulcers.   - Tylenol 650 mg po every 6 hours as needed.  - heat pack alternate with ice packs a couple times daily.  - stretch exercises. - Consider physical therapy referral if not better in 2 weeks.

## 2012-07-19 NOTE — Progress Notes (Signed)
Subjective:   Patient ID: KEDRIC SETTERS male   DOB: July 17, 1955 57 y.o.   MRN: [id number] Chief complains: acute back pain HPI: Mr.Dominie A Levatino is a 57 y.o. man with PMH of chronic abdominal pain, chronic pancreatitis, Hepatitis C and alcoholism,CHF, CVA, HTN, HLD, GERD and PUD, Schizophrenia, and cocaine abuse who presents to the clinic for ED follow up.   He had recent admission in June 2014 for GIB 2/2 gastric ulcer and H. Pylori (taking amoxicillin, clarithromycin, pantoprazole).    # Acute back pain Patient reports that he started to have right sided back/flank pain 2 weeks ago. He describes that his back pain is located at mid to lower back area, dull/throbbing pain, constant, 5-7/10, no radiation. Walking and movement make the pain worse, and resting makes it better. Associated symptoms include generalized abdominal pain at his baseline.  Denies dysuria, urinary frequency or urgency. Denies weakness, numbness or tingling. Denies bowel or urinary incontinence. Denies trauma or injury.   Patient went to [facility] room on 6/19 and 07/15/2012 for evaluation.  He had an unremarkable workup including VVS, CBC, CMP, UA, FOBT, chest x-ray. The renal ultrasound showed advanced hepatic steatosis,  and abdominal CT showed no acute findings.  Given the history of multiple providers for narcotics and substance abuse, he was only given morphine and Zofran at ED.  His told to followup with the clinic.  Past Medical History  Diagnosis Date  . GERD (gastroesophageal reflux disease)   . Schizophrenia, schizo-affective   . Cataract   . Hyperlipidemia   . Wears glasses   . Wears dentures     upper  . Hypertension     uncontrolled with medication noncompliance  . Hepatitis C   . HIT (heparin-induced thrombocytopenia)   . Continuous chronic alcoholism   . H/O cocaine abuse   . Stroke 01/2012    Small cerebellar infarcts right greater than left as well as questionable acute left  external capsule and caudate nuclear punctate lacunar infarcts noted per MRI (01/2012) - presumed to be embolic likely source PFO with right to left shunt (noted per TEE 01/ 2014)  . Chronic combined systolic and diastolic CHF (congestive heart failure) 02/29/2012    LV EF 40-45% per 2D echo (02/2012) with grade 1 diastolic dysfunction. 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 Degenerative lumbar spinal stenosis     s/p L2-3, L3-4, L4-5 laminectomy partial facetectomy, and bilateral foraminotomy  . History of pancreatitis 01/2011    Admission for acute pancreatitis presumed 2/2 ongoing alcohol abuse and hypertriglyceridemia  . PFO (patent foramen ovale) 01/2012    with right to left shunt, noted per TEE in evaluation for source of embolic stroke in [id number]  . CKD (chronic kidney disease) stage 2, GFR 60-89 ml/min     BL SCr approximately 1-1.3  . 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 Hepatic steatosis     suspected 2/2 alcohol abuse  . Splenic cyst   . Colitis 05/2009    History of colitis of ascending colon noted on CT abd/pelvis (05/2009), with interval resolution with subsequent CT  . Hypertriglyceridemia    Current Outpatient Prescriptions  Medication Sig Dispense Refill  . amoxicillin (AMOXIL) 500 MG capsule Take 1,000 mg by mouth 2 (two) times daily.      Marland Kitchen atorvastatin (LIPITOR) 40 MG tablet Take 40 mg by  mouth daily.      . carvedilol (COREG) 12.5 MG tablet Take 12.5 mg by mouth 2 (two) times daily with a meal.      . clarithromycin (BIAXIN) 500 MG tablet Take 500 mg by mouth 2 (two) times daily.      . cloNIDine (CATAPRES) 0.2 MG tablet Take 0.2 mg by mouth 2 (two) times daily.      . furosemide (LASIX) 40 MG tablet Take 80 mg by mouth 2 (two) times daily.      Marland Kitchen ketoconazole (NIZORAL) 2 % cream Apply 1 application topically daily.      Marland Kitchen labetalol  (NORMODYNE) 100 MG tablet Take 100 mg by mouth 2 (two) times daily.      Marland Kitchen lisinopril (PRINIVIL,ZESTRIL) 20 MG tablet Take 10-20 mg by mouth 2 (two) times daily. Take 10mg  in AM and 20mg  in PM      . ondansetron (ZOFRAN) 8 MG tablet Take 1 tablet (8 mg total) by mouth every 8 (eight) hours as needed for nausea.  20 tablet  0  . pantoprazole (PROTONIX) 40 MG tablet Take 40 mg by mouth 2 (two) times daily.      . potassium chloride (K-DUR) 10 MEQ tablet Take 10 mEq by mouth daily.      . traZODone (DESYREL) 100 MG tablet Take 100 mg by mouth at bedtime.       No current facility-administered medications for this visit.   Family History  Problem Relation Age of Onset  . CAD Mother 48  . CAD Sister   . CAD Brother 22    died from MI at age 28yo  . Hypertension     History   Social History  . Marital Status: Single    Spouse Name: N/A    Number of Children: N/A  . Years of Education: 12th grade   Occupational History  . Disability     2/2 schizophrenia   Social History Main Topics  . Smoking status: Former Smoker -- 0.50 packs/day for 30 years    Types: Cigarettes    Quit date: 06/24/2001  . Smokeless tobacco: None  . Alcohol Use: No     Comment: Quit x 1.5 months.  . Drug Use: No     Comment: history of cocaine abuse  . Sexually Active: None   Other Topics Concern  . None   Social History Narrative   Lives in [place] alone, has a [employer] aide that helps with medications 4-5 days a week with medications, helping to clean.   Review of Systems: Review of Systems:  Constitutional:  Denies fever, chills, diaphoresis, appetite change and fatigue.   HEENT:  Denies congestion, sore throat, rhinorrhea, sneezing, mouth sores, trouble swallowing, neck pain   Respiratory:  Denies SOB, DOE, cough, and wheezing.   Cardiovascular:  Denies palpitations and leg swelling.   Gastrointestinal:  Denies nausea, vomiting, diarrhea, constipation, blood in stool and abdominal distention.  Positive for abd pain  Genitourinary:  Denies dysuria, urgency, frequency, hematuria, flank pain and difficulty urinating.   Musculoskeletal:  Denies myalgias,joint swelling, arthralgias and gait problem. positive for back pain.   Skin:  Denies pallor, rash and wound.   Neurological:  Denies dizziness, seizures, syncope, weakness, light-headedness, numbness and headaches.    .   Objective:  Physical Exam: Filed Vitals:   07/19/12 0901  BP: 113/72  Pulse: 59  Temp: 98 F (36.7 C)  TempSrc: Oral  Height: 5\' 11"  (1.803 m)  Weight: 243 lb 9.6  oz (110.496 kg)  SpO2: 100%   General: alert, well-developed, and cooperative to examination.  Head: normocephalic and atraumatic.  Eyes: vision grossly intact, pupils equal, pupils round, pupils reactive to light, no injection and anicteric.  Mouth: pharynx pink and moist, no erythema, and no exudates.  Neck: supple, full ROM, no thyromegaly, no JVD, and no carotid bruits.  Lungs: normal respiratory effort, no accessory muscle use, normal breath sounds, no crackles, and no wheezes. Heart: normal rate, regular rhythm, no murmur, no gallop, and no rub.  Abdomen: soft, normal bowel sounds, no distention, no guarding, no rebound tenderness, no hepatomegaly, and no splenomegaly. Right sided abdomen mild tenderness with guarding unless distracted.  Msk: no joint swelling, no joint warmth, and no redness over joints. Paraspinal tenderness to palpation on mid to lower back area. SLRT negative Pulses: 2+ DP/PT pulses bilaterally Extremities: No cyanosis, clubbing, edema Neurologic: alert & oriented X3, cranial nerves II-XII intact, strength normal in all extremities, sensation intact to light touch, and gait normal.  Skin: turgor normal and no rashes.  Psych: Oriented X3, memory intact for recent and remote, normally interactive, good eye contact, not anxious appearing, and not depressed appearing.   Assessment & Plan:

## 2012-07-27 NOTE — Progress Notes (Signed)
Case discussed with Dr. Nicoletta Dress soon after the resident saw the patient.  We reviewed the resident's history and exam and pertinent patient test results.  I agree with the assessment, diagnosis, and plan of care documented in the resident's note.  Agree with avoiding narcotics, which are not usually appropriate therapy in acute lumbago.  Would see Mr. Trindade back if back pain does not improve and do a trial of physical therapy.

## 2012-08-17 ENCOUNTER — Other Ambulatory Visit: Payer: Self-pay | Admitting: *Deleted

## 2012-08-17 MED ORDER — LISINOPRIL 20 MG PO TABS: 10.0000 mg | ORAL_TABLET | Freq: Two times a day (BID) | ORAL | Status: DC

## 2012-08-23 ENCOUNTER — Ambulatory Visit (HOSPITAL_COMMUNITY)
Admission: RE | Admit: 2012-08-23 | Discharge: 2012-08-23 | Disposition: A | Discharge status: Home / Self Care | Payer: PRIVATE HEALTH INSURANCE | Source: Ambulatory Visit | Attending: Internal Medicine | Admitting: Internal Medicine

## 2012-08-23 ENCOUNTER — Encounter (HOSPITAL_COMMUNITY): Payer: Self-pay

## 2012-08-23 VITALS — BP 130/70 | HR 58 | Wt 246.1 lb

## 2012-08-23 DIAGNOSIS — I10 Essential (primary) hypertension: Secondary | ICD-10-CM | POA: Insufficient documentation

## 2012-08-23 DIAGNOSIS — N182 Chronic kidney disease, stage 2 (mild): Secondary | ICD-10-CM | POA: Insufficient documentation

## 2012-08-23 DIAGNOSIS — Z8673 Personal history of transient ischemic attack (TIA), and cerebral infarction without residual deficits: Secondary | ICD-10-CM | POA: Insufficient documentation

## 2012-08-23 DIAGNOSIS — I5042 Chronic combined systolic (congestive) and diastolic (congestive) heart failure: Secondary | ICD-10-CM | POA: Insufficient documentation

## 2012-08-23 DIAGNOSIS — I129 Hypertensive chronic kidney disease with stage 1 through stage 4 chronic kidney disease, or unspecified chronic kidney disease: Secondary | ICD-10-CM | POA: Insufficient documentation

## 2012-08-23 DIAGNOSIS — E785 Hyperlipidemia, unspecified: Secondary | ICD-10-CM | POA: Insufficient documentation

## 2012-08-23 DIAGNOSIS — K219 Gastro-esophageal reflux disease without esophagitis: Secondary | ICD-10-CM | POA: Insufficient documentation

## 2012-08-23 DIAGNOSIS — F209 Schizophrenia, unspecified: Secondary | ICD-10-CM | POA: Insufficient documentation

## 2012-08-23 DIAGNOSIS — I509 Heart failure, unspecified: Secondary | ICD-10-CM

## 2012-08-23 DIAGNOSIS — I428 Other cardiomyopathies: Secondary | ICD-10-CM | POA: Insufficient documentation

## 2012-08-23 DIAGNOSIS — Z79899 Other long term (current) drug therapy: Secondary | ICD-10-CM | POA: Insufficient documentation

## 2012-08-23 DIAGNOSIS — B1920 Unspecified viral hepatitis C without hepatic coma: Secondary | ICD-10-CM | POA: Insufficient documentation

## 2012-08-23 DIAGNOSIS — Z87891 Personal history of nicotine dependence: Secondary | ICD-10-CM | POA: Insufficient documentation

## 2012-08-23 DIAGNOSIS — F1020 Alcohol dependence, uncomplicated: Secondary | ICD-10-CM | POA: Insufficient documentation

## 2012-08-23 DIAGNOSIS — Q211 Atrial septal defect: Secondary | ICD-10-CM | POA: Insufficient documentation

## 2012-08-23 DIAGNOSIS — Q2111 Secundum atrial septal defect: Secondary | ICD-10-CM | POA: Insufficient documentation

## 2012-08-23 MED ORDER — CARVEDILOL 12.5 MG PO TABS: 18.7500 mg | ORAL_TABLET | Freq: Two times a day (BID) | ORAL | Status: DC

## 2012-08-23 NOTE — Progress Notes (Signed)
Patient ID: [name] [name], male   DOB: [birth year], 57 y.o.   MRN: [id number] Referring Physician: Dr. Silverio Decamp Primary Care: Internal Medicine clinic, Dr. Silverio Decamp Primary Cardiologist: Dr. Doylene Canard  GI: Dr Ardis Hughs.   HPI: [name] is a 8 y.o. 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  History of substance abuse but quit smoking cigarettes 2011  after smoking 25 years, alcohol use but not any more as well as history of cocaine use.  Had history of acute renal failure in setting of rhabdo requiring short term HD.    Admitted with new onset HF in April 2014.   He was diuresed and discharged home.  Discharge weight 256 pounds. Echo at the time showed EF 123456, grade 1 diastolic dysfunction.    Cath 04/27/12 [facility] RA = 4  RV = 45/3/4  PA = 42/9 (24)  PCW = 14  Fick cardiac output/index = 5.2/2.2  PVR = 1.9 [place]  SVR = 1656  FA sat = 96%  PA sat = 62%, 64%  Ao Pressure: 149/84 (11)  LV Pressure: 142/8/29  There was no signficant gradient across the aortic valve on pullback.  Arteries normal. LV gram EF= 35-40% global HK  Admitted to [facility] 5/30 through 06/27/12 with GI bleed. EGD 06/25/12 multiple ulcers noted. Continued on protonix.   He returns for follow up. At last visit labetalol switched to carvedilol and clonidine wean began. Feeling pretty good. Recently seen in ER for back pain and felt to have muscle spasms. BMET looked good at that time.  Remains on lasix 80 bid. No orthopnea, PND or dyspnea. No edema. Weighting every day. Says weight fluctuates at home around 240-245.  Occasionally takes extra lasix when he is not peeing enough. Does not take extra lasix based on weight.     Review of Systems: All pertinent positives and negatives as in HPI, otherwise negative.    Past Medical History  Diagnosis Date  . GERD (gastroesophageal reflux disease)   . Schizophrenia, schizo-affective   . Cataract   .  Hyperlipidemia   . Wears glasses   . Wears dentures     upper  . Hypertension     uncontrolled with medication noncompliance  . Hepatitis C   . HIT (heparin-induced thrombocytopenia)   . Continuous chronic alcoholism   . H/O cocaine abuse   . Stroke 01/2012    Small cerebellar infarcts right greater than left as well as questionable acute left external capsule and caudate nuclear punctate lacunar infarcts noted per MRI (01/2012) - presumed to be embolic likely source PFO with right to left shunt (noted per TEE 01/ 2014)  . Chronic combined systolic and diastolic CHF (congestive heart failure) 02/29/2012    LV EF 40-45% per 2D echo (02/2012) with grade 1 diastolic dysfunction. 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 Degenerative lumbar spinal stenosis     s/p L2-3, L3-4, L4-5 laminectomy partial facetectomy, and bilateral foraminotomy  . History of pancreatitis 01/2011    Admission for acute pancreatitis presumed 2/2 ongoing alcohol abuse and hypertriglyceridemia  . PFO (patent foramen ovale) 01/2012    with right to left shunt, noted per TEE in evaluation for source of embolic stroke in [id number]  . CKD (chronic kidney disease) stage 2, GFR 60-89 ml/min     BL SCr approximately 1-1.3  . Rhabdomyolysis 02/22/2012  H/O rhabdomyolysis in 01/2012 that was idiopathic, cause never identified  . Hepatic steatosis     suspected 2/2 alcohol abuse  . Splenic cyst   . Colitis 05/2009    History of colitis of ascending colon noted on CT abd/pelvis (05/2009), with interval resolution with subsequent CT  . Hypertriglyceridemia     Current Outpatient Prescriptions  Medication Sig Dispense Refill  . acetaminophen (TYLENOL) 325 MG tablet Take 2 tablets (650 mg total) by mouth every 6 (six) hours as needed for pain.  50 tablet  0  . atorvastatin (LIPITOR) 40 MG tablet Take 40 mg by mouth daily.      . carvedilol (COREG) 12.5 MG tablet  Take 12.5 mg by mouth 2 (two) times daily with a meal.      . cloNIDine (CATAPRES) 0.2 MG tablet Take 0.2 mg by mouth 2 (two) times daily.      . furosemide (LASIX) 40 MG tablet Take 80 mg by mouth 2 (two) times daily.      Marland Kitchen ketoconazole (NIZORAL) 2 % cream Apply 1 application topically daily.      Marland Kitchen labetalol (NORMODYNE) 100 MG tablet Take 100 mg by mouth 2 (two) times daily.      Marland Kitchen lisinopril (PRINIVIL,ZESTRIL) 20 MG tablet Take 0.5-1 tablets (10-20 mg total) by mouth 2 (two) times daily. Take 10mg  in AM and 20mg  in PM  45 tablet  4  . pantoprazole (PROTONIX) 40 MG tablet Take 40 mg by mouth 2 (two) times daily.      . potassium chloride (K-DUR) 10 MEQ tablet Take 10 mEq by mouth daily.      . rosuvastatin (CRESTOR) 20 MG tablet Take 20 mg by mouth daily.      . traZODone (DESYREL) 100 MG tablet Take 100 mg by mouth at bedtime.       No current facility-administered medications for this encounter.    Allergies  Allergen Reactions  . Thorazine (Chlorpromazine Hcl) Other (See Comments)    Body freezes up    PHYSICAL EXAM: Filed Vitals:   08/23/12 0935  BP: 130/70  Pulse: 58  Weight: 246 lb 1.9 oz (111.639 kg)  SpO2: 96%    General:  NAD. No respiratory difficulty HEENT: normal Neck: supple. JVP 5-6.  Carotids 2+ bilat; no bruits. No lymphadenopathy or thryomegaly appreciated. Cor: PMI nondisplaced. Regular rate & rhythm. No rubs, gallops, Very soft TR murmur. Lungs: clear Abdomen: soft, nontender, mild distention. No hepatosplenomegaly. No bruits or masses. Good bowel sounds. Extremities: no cyanosis, clubbing, rash, tr edema  Neuro: alert & oriented x 3, cranial nerves grossly intact. moves all 4 extremities w/o difficulty. Flat Affect    ASSESSMENT  1) Chronic systolic/diastolic HF due to NICM EF 35-40% 2) HTN  PLAN:  Overall doing quite well from a HF perspective. NYHA I-II. Weight is up some but no fluid overload on exam. Reinforced need for daily weights and  reviewed use of sliding scale diuretics. If weight hits 248 pounds at home he is to take an extra 80 mg lasix that day. At last visit, we stopped labetalol but he is still taking with carvedilol. We have taken labetalol out of his pill bag and we will increase carvedilol to 18.75 bid. Continue lisinopril.  We will repeat echo to reassess LV function for recovery.  From a BP perspective. He is much improved. If EF still down on ECHO will try to wean clonidine completely and switch out for spiro +/- amlodipine.   We  will see back in 6 weeks.   Daniel Bensimhon,MD 10:13 AM

## 2012-08-23 NOTE — Patient Instructions (Addendum)
Stop Labetalol Increase Carvedilol to 18.75 mg twice a day (1 and 1/2 tablets twice a day) You may take extra Lasix (Furosemide) 80 mg if weight is above 258 lbs. Continue all other medications as listed.  Your physician has requested that you have an echocardiogram in 6 weeks. Echocardiography is a painless test that uses sound waves to create images of your heart. It provides your doctor with information about the size and shape of your heart and how well your heart's chambers and valves are working. This procedure takes approximately one hour. There are no restrictions for this procedure.  Follow in 6 weeks with Dr Haroldine Laws.

## 2012-08-25 ENCOUNTER — Other Ambulatory Visit: Payer: Self-pay | Admitting: *Deleted

## 2012-08-25 MED ORDER — TRAZODONE HCL 100 MG PO TABS: 100.0000 mg | ORAL_TABLET | Freq: Every day | ORAL | Status: DC

## 2012-08-31 ENCOUNTER — Other Ambulatory Visit: Payer: Self-pay | Admitting: Gastroenterology

## 2012-09-15 ENCOUNTER — Ambulatory Visit (INDEPENDENT_AMBULATORY_CARE_PROVIDER_SITE_OTHER): Payer: PRIVATE HEALTH INSURANCE | Admitting: Internal Medicine

## 2012-09-15 ENCOUNTER — Encounter: Payer: Self-pay | Admitting: Internal Medicine

## 2012-09-15 VITALS — BP 133/83 | HR 65 | Temp 97.8°F | Ht 71.0 in | Wt 260.1 lb

## 2012-09-15 DIAGNOSIS — M546 Pain in thoracic spine: Secondary | ICD-10-CM

## 2012-09-15 DIAGNOSIS — M19019 Primary osteoarthritis, unspecified shoulder: Secondary | ICD-10-CM

## 2012-09-15 DIAGNOSIS — F1910 Other psychoactive substance abuse, uncomplicated: Secondary | ICD-10-CM

## 2012-09-15 DIAGNOSIS — M109 Gout, unspecified: Secondary | ICD-10-CM

## 2012-09-15 DIAGNOSIS — M549 Dorsalgia, unspecified: Secondary | ICD-10-CM

## 2012-09-15 MED ORDER — TRAMADOL HCL 50 MG PO TABS: 50.0000 mg | ORAL_TABLET | Freq: Two times a day (BID) | ORAL | Status: DC | PRN

## 2012-09-15 NOTE — Assessment & Plan Note (Signed)
Likely becoming chronic as appears to be ongoing for 2 months now.   Will plan to give short course of tramadol for acute pain, refer to PT and check a Tox Screen.  He denies any recent illicit drug use.  I did a check of the [facility] and there are no reported Rx for narcotics since 07/04/12.    Once the tox screen results, I will forward to his PCP for further decisions regarding pain medication therapy.

## 2012-09-15 NOTE — Progress Notes (Signed)
  Subjective:    Patient ID: [name] [name], male    DOB: March 17, 1955, 57 y.o.   MRN: [id number]  CC: Chronic back pain  Back Pain Associated symptoms include chest pain (exertional). Pertinent negatives include no abdominal pain, dysuria, fever or weakness.    Mr. Wecker is a 57yo man with PMH of Hep C, HTN, CHF, spinal stenosis, cirrhosis, gastric ulcers and CKD who presents for evaluation of low back pain.  He reports that the pain has been getting worse over the past 2 months, and worse since last being seen by his PCP Dr. Murlean Caller. He reports the pain is now present day and night, 7.5/10, aching and occasionally sharp.  The pain does not radiate out of his back and he denies any radiation down the leg.  He reports no alleviating factors except some mild improvement with lying down, but then gets worse with lying for a long time.  Having trouble getting comfortable to sleep.  The pain is worse in the left back.  No urinary symptoms.   He has a history of back surgery which did improve the pain somewhat, but it continues to be a problem.   He has previously been on vicoden and wanted to be considered for percocet, however, at his last PCP visit, he was found to be receiving narcotics from multiple providers and his urine tox screen was positive for cocaine.  Because of this, Dr. Murlean Caller chose not to give him any further narcotics.  I reviewed the narcotics database today and found that he has not had a reported narcotic prescription since 07/04/12.  He reports being "out" of his narcotics.    He would like a cane.    He further has a history of CHF and does have exertional chest pressure that gets better with rest (no chest pain today) and SOB with exercise as well. These do not seem to be acute issues today.     Review of Systems  Constitutional: Negative for fever, chills and appetite change.  HENT: Negative for hearing loss and ear discharge.   Eyes: Negative for redness and visual disturbance.   Respiratory: Positive for shortness of breath (exertional). Negative for cough.   Cardiovascular: Positive for chest pain (exertional). Negative for leg swelling.  Gastrointestinal: Negative for abdominal pain and abdominal distention.  Genitourinary: Negative for dysuria, frequency, hematuria, flank pain and difficulty urinating.  Musculoskeletal: Positive for back pain and gait problem (due to pain).  Neurological: Negative for dizziness and weakness.       Objective:   Physical Exam  Constitutional: He is oriented to person, place, and time. He appears well-developed and well-nourished. No distress.  HENT:  Head: Normocephalic and atraumatic.  Eyes: Conjunctivae are normal. No scleral icterus.  Cardiovascular: Normal rate, regular rhythm and normal heart sounds.   No murmur heard. Pulmonary/Chest: Effort normal and breath sounds normal. No respiratory distress. He has no wheezes.  Abdominal: Soft.  Musculoskeletal: He exhibits tenderness (lower back). He exhibits no edema.  + decreased flexion/extension at the lumbar spine, negative SLR  Neurological: He is alert and oriented to person, place, and time.  Skin: Skin is warm and dry.  Psychiatric: He has a normal mood and affect. His behavior is normal.   UDS today     Assessment & Plan:  RTC to see Dr. Murlean Caller or [facility] [place] resident in 3-4 weeks, will need follow up of UDS with Dr. Murlean Caller before narcotics are prescribed.

## 2012-09-15 NOTE — Progress Notes (Signed)
This is a Careers information officer Note.  The care of the patient was discussed with Dr. Daryll Drown and the assessment and plan was formulated with their assistance.  Please see their note for official documentation of the patient encounter.   Subjective:   Patient ID: [name] [name] male   DOB: 06/16/1955 57 y.o.   MRN: [id number]  HPI: [name] [name] is a 57 y.o. w/ PMH of HIT, Hep C, HTN, CHF, Spinal stenosis, Cirrhosis, gastic ulcers and CKD who presents for evaluation of lower back pain. He reports that the pain has gotten worse since his appointment in late June. He reports the pain is present day and night now where before it was just during the night. He rates his pain as 7.5/10 and describes it as an aching pain that is occasionally sharp. The pain does not radiate down his legs and only extends to his hip. Patient describes the pain while walking is similar to before, denies tingling in finger tips or toes. The pain is mildly reduced when laying in supine position, but he reports that constant laying exacerbates pain. He reports that Dr. Murlean Caller had placed him on Hydrocodone which did not resolve symptoms, so Dr. Murlean Caller was planning to convert patient to Percocet, however Dr. Murlean Caller inquired about patient in [place] drug database revealing multiple attempts with different providers to fill prescriptions with multiple different home addresses on file. Dr Murlean Caller elected to cancel his prescription and obtain urine toxicology screen at last appointment. He advised to not prescribe narcotics to this patient. Patient later saw Dr. Nicoletta Dress then saw patient later and prescribed Tylenol for pain control and advised patient to stretch, use head/cold packs and to consider referring to PT if symptoms did not improve.   [name] [name] also request a cane to use while walking. He used a cane following his back surgery last year, but no longer has the cane. He was more comfortable walking with the cane during that time and believes it  will improve his comfort while walking and his safety.     Past Medical History  Diagnosis Date   GERD (gastroesophageal reflux disease)    Schizophrenia, schizo-affective    Cataract    Hyperlipidemia    Wears glasses    Wears dentures     upper   Hypertension     uncontrolled with medication noncompliance   Hepatitis C    HIT (heparin-induced thrombocytopenia)    Continuous chronic alcoholism    H/O cocaine abuse    Stroke 01/2012    Small cerebellar infarcts right greater than left as well as questionable acute left external capsule and caudate nuclear punctate lacunar infarcts noted per MRI (01/2012) - presumed to be embolic likely source PFO with right to left shunt (noted per TEE 01/ 2014)   Chronic combined systolic and diastolic CHF (congestive heart failure) 02/29/2012    LV EF 40-45% per 2D echo (02/2012) with grade 1 diastolic dysfunction. Presumed to be 2/2 NICM in setting of dilated cardiomyopathy due to alcohol abuse and uncontrolled HTN   Degenerative lumbar spinal stenosis     s/p L2-3, L3-4, L4-5 laminectomy partial facetectomy, and bilateral foraminotomy   History of pancreatitis 01/2011    Admission for acute pancreatitis presumed 2/2 ongoing alcohol abuse and hypertriglyceridemia   PFO (patent foramen ovale) 01/2012    with right to left shunt, noted per TEE in evaluation for source of embolic stroke in [id number]   CKD (chronic kidney disease) stage 2, GFR  60-89 ml/min     BL SCr approximately 1-1.3   Rhabdomyolysis 02/22/2012    H/O rhabdomyolysis in 01/2012 that was idiopathic, cause never identified   Hepatic steatosis     suspected 2/2 alcohol abuse   Splenic cyst    Colitis 05/2009    History of colitis of ascending colon noted on CT abd/pelvis (05/2009), with interval resolution with subsequent CT   Hypertriglyceridemia    Current Outpatient Prescriptions  Medication Sig Dispense Refill   acetaminophen (TYLENOL) 325 MG tablet Take 2  tablets (650 mg total) by mouth every 6 (six) hours as needed for pain.  50 tablet  0   atorvastatin (LIPITOR) 40 MG tablet Take 40 mg by mouth daily.       carvedilol (COREG) 12.5 MG tablet Take 1.5 tablets (18.75 mg total) by mouth 2 (two) times daily with a meal.  90 tablet  3   cloNIDine (CATAPRES) 0.2 MG tablet Take 0.2 mg by mouth 2 (two) times daily.       furosemide (LASIX) 40 MG tablet Take 80 mg by mouth 2 (two) times daily.       ketoconazole (NIZORAL) 2 % cream Apply 1 application topically daily.       labetalol (NORMODYNE) 100 MG tablet Take 100 mg by mouth 2 (two) times daily.       lisinopril (PRINIVIL,ZESTRIL) 20 MG tablet Take 0.5-1 tablets (10-20 mg total) by mouth 2 (two) times daily. Take 10mg  in AM and 20mg  in PM  45 tablet  4   pantoprazole (PROTONIX) 40 MG tablet Take 40 mg by mouth 2 (two) times daily.       potassium chloride (K-DUR) 10 MEQ tablet Take 10 mEq by mouth daily.       rosuvastatin (CRESTOR) 20 MG tablet Take 20 mg by mouth daily.       traZODone (DESYREL) 100 MG tablet Take 1 tablet (100 mg total) by mouth at bedtime.  30 tablet  2   No current facility-administered medications for this visit.   Family History  Problem Relation Age of Onset   CAD Mother 69   CAD Sister    CAD Brother 60    died from MI at age 66yo   Hypertension     History   Social History   Marital Status: Single    Spouse Name: N/A    Number of Children: N/A   Years of Education: 12th grade   Occupational History   Disability     2/2 schizophrenia   Social History Main Topics   Smoking status: Former Smoker -- 0.50 packs/day for 30 years    Types: Cigarettes    Quit date: 06/24/2001   Smokeless tobacco: None   Alcohol Use: No     Comment: Quit x 1.5 months.   Drug Use: No     Comment: history of cocaine abuse   Sexual Activity: None   Other Topics Concern   None   Social History Narrative   Lives in [place] alone, has a [place] aide that  helps with medications 4-5 days a week with medications, helping to clean.   Review of Systems: Constitutional: negative for chills, fatigue, night sweats and weight loss Eyes: negative for visual disturbance Ears, nose, mouth, throat, and face: negative for facial trauma and hearing loss Respiratory: positive for dyspnea on exertion and chest pain when increasing fluid intake Cardiovascular: positive for exertional chest pressure/discomfort Gastrointestinal: negative for change in bowel habits, constipation and diarrhea Genitourinary:negative  for hematuria and reports polyuria when taking lasix. Integument/breast: negative for dryness and rash Musculoskeletal:positive for back pain Neurological: negative for dizziness and occasional weakness in left leg when walking Objective:  Physical Exam: Filed Vitals:   09/15/12 0830  BP: 133/83  Pulse: 65  Temp: 97.8 F (36.6 C)  TempSrc: Oral  Height: 5\' 11"  (1.803 m)  Weight: 260 lb 1.6 oz (117.981 kg)  SpO2: 97%   General appearance: alert, cooperative and no distress Head: Normocephalic, without obvious abnormality, atraumatic Eyes: conjunctivae/corneas clear. PERRL, EOM's intact. Fundi benign. Nose: Nares normal. Septum midline. Mucosa normal. No drainage or sinus tenderness. Throat: lips, mucosa, and tongue normal; teeth and gums normal Neck: no adenopathy Back: tenderness to palpation along lower back bilaterally. Left side more tender along iliac spine. No shooting pain with leg lift test. Minimal ability to lean with flexion of back (<10'), pain with leaning forwards at 30'. Lungs: clear to auscultation bilaterally Heart: regular rate and rhythm Abdomen: soft, non-tender; bowel sounds normal; no masses,  no organomegaly Extremities: extremities normal, atraumatic, no cyanosis or edema Neurologic: Alert and oriented X 3, normal strength and tone. Normal symmetric reflexes. Normal coordination and gait Assessment & Plan:  Mr.  Fuselier signs and symptoms are consistent with previous examinations suggestion musculoskeletal pain. Differential diagnosis include osteoarthritis and possible sciatica. OA is unlikely as the pain is worsened with function overtime. Sciatica is unlikely as his pain is localized and does not appear to radiate. Due to Mr Strodtman past use of cocaine, Dr. Murlean Caller prefers to not prescribe Narcotics. Today we will prescribe tramadol and obtain and updated urine tox screen to evaluate for substance abuse as the patient reports he has not used drugs. We will notify Dr. Murlean Caller of the test and defer further pain management to him pending results. If tramadol and future alterations to pain management by Dr. Murlean Caller are ineffective, patient may need to be referred to pain clinic. We also completed a referral today to PT for evaluation and treatment of lower back pain. Patient was advised to purchase a cane at his local drug store, as if one is provided today insurance will not cover any assisted walking device for 5 years and he may require walker following evaluation by PT. Patient understands there reasoning for not receiving a cane today and will purchase one at his pharmacy.  Disposition: Dr. Murlean Caller will be made aware of test results and will contact patient for future management of pain. Patient also will follow up with PT for evaluation and therapy.

## 2012-09-15 NOTE — Patient Instructions (Addendum)
It was a pleasure to meet you today! - We will prescribe Tramadol today to help reduce your pain while we await the results of your urine sample. We will notify Dr. Murlean Caller to follow up with future plans once the results are back. - We will also refer you to a physical therapist to evaluate and work to improve your back symptoms.  - You should purchase a cane at your local drug store to use until you are seen by the physical therapist. They may recommend a different walker or cain that your insurance can then cover.  - To help reduce your pain you may use ice packs, heat packs, limit exercise, but attempt to stretch your back by bending forward and backwards slowly.

## 2012-09-16 LAB — PRESCRIPTION ABUSE MONITORING 15P, URINE
Cocaine Metabolites: NEGATIVE ng/mL
Creatinine, Urine: 102.2 mg/dL (ref >20.0)
Meperidine, Ur: NEGATIVE ng/mL
Methadone Screen, Urine: NEGATIVE ng/mL
Opiate Screen, Urine: NEGATIVE ng/mL
Oxycodone Screen, Ur: NEGATIVE ng/mL
Propoxyphene: NEGATIVE ng/mL

## 2012-09-19 ENCOUNTER — Telehealth: Payer: Self-pay | Admitting: *Deleted

## 2012-09-19 NOTE — Telephone Encounter (Signed)
Pt called to check if he could get Rx for Percocet. Hilda Blades Angeleigh Chiasson RN 09/19/12 9:30AM

## 2012-09-20 NOTE — Telephone Encounter (Signed)
I'm out of the office this week, but refer to my documentation on 6/12. He needs a referral to pain management, I'm not comfortable prescribing him narcotics. Thanks.

## 2012-09-21 NOTE — Telephone Encounter (Signed)
Pt aware - will [facility] - pt states he has never been there.

## 2012-09-28 ENCOUNTER — Ambulatory Visit: Payer: PRIVATE HEALTH INSURANCE | Attending: Internal Medicine

## 2012-09-28 ENCOUNTER — Other Ambulatory Visit: Payer: Self-pay | Admitting: Internal Medicine

## 2012-09-28 DIAGNOSIS — M545 Low back pain, unspecified: Secondary | ICD-10-CM | POA: Insufficient documentation

## 2012-09-28 DIAGNOSIS — R293 Abnormal posture: Secondary | ICD-10-CM | POA: Insufficient documentation

## 2012-09-28 DIAGNOSIS — IMO0001 Reserved for inherently not codable concepts without codable children: Secondary | ICD-10-CM | POA: Insufficient documentation

## 2012-09-29 ENCOUNTER — Telehealth: Payer: Self-pay | Admitting: *Deleted

## 2012-09-29 NOTE — Telephone Encounter (Signed)
Pt would like gladys to call him about his referral to pain clinic

## 2012-09-29 NOTE — Telephone Encounter (Signed)
Rx called in 

## 2012-10-04 ENCOUNTER — Ambulatory Visit: Payer: PRIVATE HEALTH INSURANCE | Admitting: Rehabilitation

## 2012-10-04 ENCOUNTER — Other Ambulatory Visit: Payer: Self-pay | Admitting: Internal Medicine

## 2012-10-05 ENCOUNTER — Ambulatory Visit (HOSPITAL_COMMUNITY): Admission: RE | Admit: 2012-10-05 | Payer: PRIVATE HEALTH INSURANCE | Source: Ambulatory Visit

## 2012-10-05 ENCOUNTER — Ambulatory Visit (HOSPITAL_COMMUNITY)
Admission: RE | Admit: 2012-10-05 | Discharge: 2012-10-05 | Disposition: A | Discharge status: Home / Self Care | Payer: PRIVATE HEALTH INSURANCE | Source: Ambulatory Visit | Attending: Internal Medicine | Admitting: Internal Medicine

## 2012-10-05 DIAGNOSIS — I5042 Chronic combined systolic (congestive) and diastolic (congestive) heart failure: Secondary | ICD-10-CM

## 2012-10-06 ENCOUNTER — Ambulatory Visit: Payer: PRIVATE HEALTH INSURANCE | Admitting: Rehabilitation

## 2012-10-07 ENCOUNTER — Telehealth: Payer: Self-pay | Admitting: *Deleted

## 2012-10-07 ENCOUNTER — Other Ambulatory Visit: Payer: Self-pay | Admitting: Internal Medicine

## 2012-10-07 DIAGNOSIS — M1990 Unspecified osteoarthritis, unspecified site: Secondary | ICD-10-CM

## 2012-10-07 NOTE — Telephone Encounter (Signed)
Pt called today asking about referral to Pain Center.   I referred back to notes from 6/12 when refill for Norco was denied 8/25 pt called for pain meds, denied but no referral put in for pain center. 9/4 pt called again about referral to Pain Center 9/12 Referral for Pain Center done today.  Would you consider giving pain med until he gets in with pain center?  Not sure what took so long for the Referral but pt has been requesting for some time.

## 2012-10-11 ENCOUNTER — Encounter: Payer: PRIVATE HEALTH INSURANCE | Admitting: Rehabilitation

## 2012-10-13 ENCOUNTER — Encounter: Payer: PRIVATE HEALTH INSURANCE | Admitting: Rehabilitation

## 2012-10-16 ENCOUNTER — Emergency Department (HOSPITAL_COMMUNITY): Payer: PRIVATE HEALTH INSURANCE

## 2012-10-16 ENCOUNTER — Emergency Department (HOSPITAL_COMMUNITY)
Admission: EM | Admit: 2012-10-16 | Discharge: 2012-10-16 | Disposition: A | Discharge status: Home / Self Care | Payer: PRIVATE HEALTH INSURANCE | Attending: Emergency Medicine | Admitting: Emergency Medicine

## 2012-10-16 ENCOUNTER — Encounter (HOSPITAL_COMMUNITY): Payer: Self-pay | Admitting: *Deleted

## 2012-10-16 DIAGNOSIS — R109 Unspecified abdominal pain: Secondary | ICD-10-CM | POA: Insufficient documentation

## 2012-10-16 DIAGNOSIS — Z8719 Personal history of other diseases of the digestive system: Secondary | ICD-10-CM | POA: Insufficient documentation

## 2012-10-16 DIAGNOSIS — E785 Hyperlipidemia, unspecified: Secondary | ICD-10-CM | POA: Insufficient documentation

## 2012-10-16 DIAGNOSIS — F1411 Cocaine abuse, in remission: Secondary | ICD-10-CM | POA: Insufficient documentation

## 2012-10-16 DIAGNOSIS — F259 Schizoaffective disorder, unspecified: Secondary | ICD-10-CM | POA: Insufficient documentation

## 2012-10-16 DIAGNOSIS — K219 Gastro-esophageal reflux disease without esophagitis: Secondary | ICD-10-CM | POA: Insufficient documentation

## 2012-10-16 DIAGNOSIS — Z8673 Personal history of transient ischemic attack (TIA), and cerebral infarction without residual deficits: Secondary | ICD-10-CM | POA: Insufficient documentation

## 2012-10-16 DIAGNOSIS — Z8669 Personal history of other diseases of the nervous system and sense organs: Secondary | ICD-10-CM | POA: Insufficient documentation

## 2012-10-16 DIAGNOSIS — F1020 Alcohol dependence, uncomplicated: Secondary | ICD-10-CM | POA: Insufficient documentation

## 2012-10-16 DIAGNOSIS — N182 Chronic kidney disease, stage 2 (mild): Secondary | ICD-10-CM | POA: Insufficient documentation

## 2012-10-16 DIAGNOSIS — Z8739 Personal history of other diseases of the musculoskeletal system and connective tissue: Secondary | ICD-10-CM | POA: Insufficient documentation

## 2012-10-16 DIAGNOSIS — Z87891 Personal history of nicotine dependence: Secondary | ICD-10-CM | POA: Insufficient documentation

## 2012-10-16 DIAGNOSIS — Z862 Personal history of diseases of the blood and blood-forming organs and certain disorders involving the immune mechanism: Secondary | ICD-10-CM | POA: Insufficient documentation

## 2012-10-16 DIAGNOSIS — Z8619 Personal history of other infectious and parasitic diseases: Secondary | ICD-10-CM | POA: Insufficient documentation

## 2012-10-16 DIAGNOSIS — Z79899 Other long term (current) drug therapy: Secondary | ICD-10-CM | POA: Insufficient documentation

## 2012-10-16 DIAGNOSIS — I5042 Chronic combined systolic (congestive) and diastolic (congestive) heart failure: Secondary | ICD-10-CM | POA: Insufficient documentation

## 2012-10-16 DIAGNOSIS — E781 Pure hyperglyceridemia: Secondary | ICD-10-CM | POA: Insufficient documentation

## 2012-10-16 DIAGNOSIS — I129 Hypertensive chronic kidney disease with stage 1 through stage 4 chronic kidney disease, or unspecified chronic kidney disease: Secondary | ICD-10-CM | POA: Insufficient documentation

## 2012-10-16 LAB — URINALYSIS, ROUTINE W REFLEX MICROSCOPIC
Bilirubin Urine: NEGATIVE
Hgb urine dipstick: NEGATIVE
Ketones, ur: NEGATIVE mg/dL
Nitrite: NEGATIVE
Protein, ur: 100 mg/dL — AB
Urobilinogen, UA: 1 mg/dL (ref 0.0–1.0)

## 2012-10-16 LAB — CBC WITH DIFFERENTIAL/PLATELET
Hemoglobin: 12.8 g/dL — ABNORMAL LOW (ref 13.0–17.0)
Lymphocytes Relative: 31 % (ref 12–46)
Lymphs Abs: 1.8 10*3/uL (ref 0.7–4.0)
MCHC: 32.1 g/dL (ref 30.0–36.0)
MCV: 80.4 fL (ref 78.0–100.0)
Monocytes Relative: 10 % (ref 3–12)
Neutrophils Relative %: 55 % (ref 43–77)
Platelets: 206 10*3/uL (ref 150–400)
RBC: 4.96 MIL/uL (ref 4.22–5.81)
WBC: 5.6 10*3/uL (ref 4.0–10.5)

## 2012-10-16 LAB — COMPREHENSIVE METABOLIC PANEL
ALT: 14 U/L (ref 0–53)
Alkaline Phosphatase: 103 U/L (ref 39–117)
CO2: 26 mEq/L (ref 19–32)
Creatinine, Ser: 0.92 mg/dL (ref 0.50–1.35)
GFR calc Af Amer: >90 mL/min (ref >90)
GFR calc non Af Amer: >90 mL/min (ref >90)
Glucose, Bld: 138 mg/dL — ABNORMAL HIGH (ref 70–99)
Potassium: 3.7 mEq/L (ref 3.5–5.1)
Sodium: 136 mEq/L (ref 135–145)
Total Bilirubin: 0.4 mg/dL (ref 0.3–1.2)

## 2012-10-16 LAB — URINE MICROSCOPIC-ADD ON

## 2012-10-16 LAB — LIPASE, BLOOD: Lipase: 42 U/L (ref 11–59)

## 2012-10-16 LAB — POCT I-STAT TROPONIN I

## 2012-10-16 MED ORDER — OXYCODONE-ACETAMINOPHEN 5-325 MG PO TABS: 2.0000 | ORAL_TABLET | ORAL | Status: DC | PRN

## 2012-10-16 MED ORDER — ONDANSETRON HCL 4 MG/2ML IJ SOLN
4.0000 mg | Freq: Once | INTRAMUSCULAR | Status: AC
  Administered 2012-10-16: 4 mg via INTRAVENOUS
  Filled 2012-10-16: qty 2

## 2012-10-16 MED ORDER — DICYCLOMINE HCL 20 MG PO TABS: 20.0000 mg | ORAL_TABLET | Freq: Four times a day (QID) | ORAL | Status: DC | PRN

## 2012-10-16 MED ORDER — MORPHINE SULFATE 4 MG/ML IJ SOLN
4.0000 mg | Freq: Once | INTRAMUSCULAR | Status: AC
  Administered 2012-10-16: 4 mg via INTRAVENOUS
  Filled 2012-10-16: qty 1

## 2012-10-16 NOTE — ED Provider Notes (Signed)
CSN: [id number]     Arrival date & time 10/16/12  0343 History   First MD Initiated Contact with Patient 10/16/12 0401     Chief Complaint  Patient presents with  . Back Pain  . Abdominal Pain   (Consider location/radiation/quality/duration/timing/severity/associated sxs/prior Treatment) HPI 57 year old male presents to emergency room with complaint of right-sided abdominal pain.  Patient reports pain started initially as back pain that then spread to his right upper abdomen.  Epigastric region, and throughout his right flank.  Pain started about 3 hours ago.  He reports eating a sandwich about an hour prior to that.  He has had nausea, but no vomiting.  The pain is severe.  He, reports he's had similar pain in the past, has history of pancreatitis from alcohol abuse.  He reports that he has not drank alcohol in last 5 months.  Patient reports he has chronic back pain, is in process of getting to chronic pain clinic.  No diarrhea, no sick contacts, no travel, no unusual foods. Past Medical History  Diagnosis Date  . GERD (gastroesophageal reflux disease)   . Schizophrenia, schizo-affective   . Cataract   . Hyperlipidemia   . Wears glasses   . Wears dentures     upper  . Hypertension     uncontrolled with medication noncompliance  . Hepatitis C   . HIT (heparin-induced thrombocytopenia)   . Continuous chronic alcoholism   . H/O cocaine abuse   . Stroke 01/2012    Small cerebellar infarcts right greater than left as well as questionable acute left external capsule and caudate nuclear punctate lacunar infarcts noted per MRI (01/2012) - presumed to be embolic likely source PFO with right to left shunt (noted per TEE 01/ 2014)  . Chronic combined systolic and diastolic CHF (congestive heart failure) 02/29/2012    LV EF 40-45% per 2D echo (02/2012) with grade 1 diastolic dysfunction. 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  Degenerative lumbar spinal stenosis     s/p L2-3, L3-4, L4-5 laminectomy partial facetectomy, and bilateral foraminotomy  . History of pancreatitis 01/2011    Admission for acute pancreatitis presumed 2/2 ongoing alcohol abuse and hypertriglyceridemia  . PFO (patent foramen ovale) 01/2012    with right to left shunt, noted per TEE in evaluation for source of embolic stroke in [id number]  . CKD (chronic kidney disease) stage 2, GFR 60-89 ml/min     BL SCr approximately 1-1.3  . 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 Hepatic steatosis     suspected 2/2 alcohol abuse  . Splenic cyst   . Colitis 05/2009    History of colitis of ascending colon noted on CT abd/pelvis (05/2009), with interval resolution with subsequent CT  . Hypertriglyceridemia    Past Surgical History  Procedure Laterality Date  . L2-3, l3-4, l4-5 laminectomy, partial facetectomy    . Left achilles  2007  . Tonsillectomy    . Knee arthroscopy    . I&d extremity  03/20/2011    Procedure: IRRIGATION AND DEBRIDEMENT EXTREMITY;  Surgeon: Kerin Salen, MD;  Location: [facility];  Service: Orthopedics;  Laterality: Left;  I&D LEFT ACHILLIES TENDON  . Eye surgery      right eye  . Resection distal clavical  09/17/2011    Procedure: RESECTION DISTAL CLAVICAL;  Surgeon: Nita Sells, MD;  Location: [facility];  Service: Orthopedics;  Laterality: Right;  right shoulder arthroscopy with sad and open distal clavicle excision   . Esophagogastroduodenoscopy N/A 06/25/2012    Procedure: ESOPHAGOGASTRODUODENOSCOPY (EGD);  Surgeon: Milus Banister, MD;  Location: [facility];  Service: Endoscopy;  Laterality: N/A;   Family History  Problem Relation Age of Onset  . CAD Mother 3  . CAD Sister   . CAD Brother 30    died from MI at age 63yo  . Hypertension     History  Substance Use Topics  . Smoking status: Former Smoker -- 0.50 packs/day for 30 years     Types: Cigarettes    Quit date: 06/24/2001  . Smokeless tobacco: Not on file  . Alcohol Use: No     Comment: Quit x 1.5 months.    Review of Systems  See History of Present Illness; otherwise all other systems are reviewed and negative  Allergies  Thorazine  Home Medications   Current Outpatient Rx  Name  Route  Sig  Dispense  Refill  . acetaminophen (TYLENOL) 325 MG tablet   Oral   Take 2 tablets (650 mg total) by mouth every 6 (six) hours as needed for pain.   50 tablet   0   . atorvastatin (LIPITOR) 40 MG tablet   Oral   Take 40 mg by mouth daily.         . carvedilol (COREG) 12.5 MG tablet   Oral   Take 1.5 tablets (18.75 mg total) by mouth 2 (two) times daily with a meal.   90 tablet   3   . cloNIDine (CATAPRES) 0.2 MG tablet   Oral   Take 0.2 mg by mouth 2 (two) times daily.         . CRESTOR 20 MG tablet      take 1 tablet by mouth once daily   30 tablet   3   . furosemide (LASIX) 40 MG tablet   Oral   Take 80 mg by mouth 2 (two) times daily.         Marland Kitchen ketoconazole (NIZORAL) 2 % cream   Topical   Apply 1 application topically daily.         Marland Kitchen labetalol (NORMODYNE) 100 MG tablet   Oral   Take 100 mg by mouth 2 (two) times daily.         Marland Kitchen lisinopril (PRINIVIL,ZESTRIL) 20 MG tablet   Oral   Take 0.5-1 tablets (10-20 mg total) by mouth 2 (two) times daily. Take 10mg  in AM and 20mg  in PM   45 tablet   4   . pantoprazole (PROTONIX) 40 MG tablet   Oral   Take 40 mg by mouth 2 (two) times daily.         . potassium chloride (K-DUR) 10 MEQ tablet   Oral   Take 10 mEq by mouth daily.         . traMADol (ULTRAM) 50 MG tablet      TAKE 1 TABLET BY MOUTH  TWICE DAILY AS NEEDED FOR PAIN   60 tablet   0   . traZODone (DESYREL) 100 MG tablet   Oral   Take 1 tablet (100 mg total) by mouth at bedtime.   30 tablet   2    BP 130/99  Pulse 94  Temp(Src) 98.1 F (36.7 C) (Oral)  Resp 14  SpO2 99% Physical Exam   Constitutional: He is oriented to person, place, and time. He appears  well-developed and well-nourished. He appears distressed.  HENT:  Head: Normocephalic and atraumatic.  Nose: Nose normal.  Mouth/Throat: Oropharynx is clear and moist.  Eyes: Conjunctivae and EOM are normal. Pupils are equal, round, and reactive to light.  Neck: Normal range of motion. Neck supple. No JVD present. No tracheal deviation present. No thyromegaly present.  Cardiovascular: Normal rate, regular rhythm, normal heart sounds and intact distal pulses.  Exam reveals no gallop and no friction rub.   No murmur heard. Pulmonary/Chest: Effort normal and breath sounds normal. No stridor. No respiratory distress. He has no wheezes. He has no rales. He exhibits no tenderness.  Abdominal: Soft. Bowel sounds are normal. He exhibits no distension and no mass. There is tenderness (significant tenderness in epigastrium, and right upper quadrant, less so inthe rest of the remaining 3 quadrants.  No CVA tenderness). There is no rebound and no guarding.  Musculoskeletal: Normal range of motion. He exhibits no edema and no tenderness.  Lymphadenopathy:    He has no cervical adenopathy.  Neurological: He is alert and oriented to person, place, and time. He exhibits normal muscle tone. Coordination normal.  Skin: Skin is warm and dry. No rash noted. No erythema. No pallor.  Psychiatric: He has a normal mood and affect. His behavior is normal. Judgment and thought content normal.    ED Course  Procedures (including critical care time) Labs Review Labs Reviewed  CBC WITH DIFFERENTIAL - Abnormal; Notable for the following:    Hemoglobin 12.8 (*)    MCH 25.8 (*)    RDW 17.7 (*)    All other components within normal limits  COMPREHENSIVE METABOLIC PANEL - Abnormal; Notable for the following:    Glucose, Bld 138 (*)    All other components within normal limits  LIPASE, BLOOD  ETHANOL  URINALYSIS, ROUTINE W REFLEX MICROSCOPIC   POCT I-STAT TROPONIN I   Imaging Review Dg Chest 2 View  10/16/2012   CLINICAL DATA:  Upper abdominal and back pain, nausea.  EXAM: CHEST  2 VIEW  COMPARISON:  07/14/2012  FINDINGS: Heart size upper limits normal. Mildly tortuous thoracic aorta. Lungs clear. No effusion. Minimal spurring in the lower thoracic spine.  IMPRESSION: No acute disease   Electronically Signed   By: Arne Cleveland M.D.   On: 10/16/2012 05:55   US Abdomen Complete  10/16/2012   CLINICAL DATA:  The right upper quadrant pain  EXAM: ABDOMEN ULTRASOUND  COMPARISON:  07/15/2012  FINDINGS: Gallbladder  No gallstones or wall thickening. Negative sonographic Murphy's sign.  Common bile duct  Diameter: 4 mm in caliber  Liver  Increased echogenicity without focal mass.  IVC  No abnormality visualized.  Pancreas  Obscured by overlying bowel gas.  Spleen  Stable cystic lesion within the central spleen.  Right Kidney  Length: 10.0 cm. Echogenicity within normal limits. No mass or hydronephrosis visualized.  Left Kidney  Length: 12.3 cm. Echogenicity within normal limits. No mass or hydronephrosis visualized.  Abdominal aorta  No aneurysm visualized.  IMPRESSION: Diffuse hepatic steatosis.  Stable cyst in this spleen.  Pancreas was obscured.   Electronically Signed   By: Maryclare Bean M.D.   On: 10/16/2012 07:13    Date: 10/16/2012  Rate: 64  Rhythm: normal sinus rhythm  QRS Axis: normal  Intervals: PR prolonged  ST/T Wave abnormalities: normal  Conduction Disutrbances:none  Narrative Interpretation:   Old EKG Reviewed: unchanged   MDM   1. Abdominal pain     57 year old male with right  upper abdominal pain.  Differential includes pancreatitis, cholecystitis, diverticulitis, kidney stone.  We'll check labs, may need ultrasound of abdomen.  Prior records reviewed.  He had similar presentation in June with negative.  Renal ultrasound, abdominal CT scan.  He has had negative.  Abdominal ultrasound within the year as well.  7:32  AM No acute findings on workup.  Patient feeling better after morphine.  Will prescribe Bentyl and very short course of pain medications    Kalman Drape, MD 10/16/12 [phone]

## 2012-10-16 NOTE — ED Notes (Signed)
2-3 hrs ago. Started having pain in abd. And mid back. Been nausea. New onset of LBBB.

## 2012-10-18 NOTE — Telephone Encounter (Signed)
Pt was given tramadol for pain and has been to ED and received pain med.  Will have [name] [name] f/u on pain referral.

## 2012-10-27 ENCOUNTER — Encounter (HOSPITAL_COMMUNITY): Payer: Self-pay

## 2012-10-27 ENCOUNTER — Ambulatory Visit (HOSPITAL_BASED_OUTPATIENT_CLINIC_OR_DEPARTMENT_OTHER)
Admission: RE | Admit: 2012-10-27 | Discharge: 2012-10-27 | Disposition: A | Discharge status: Home / Self Care | Payer: PRIVATE HEALTH INSURANCE | Source: Ambulatory Visit | Attending: Internal Medicine | Admitting: Internal Medicine

## 2012-10-27 ENCOUNTER — Other Ambulatory Visit (HOSPITAL_COMMUNITY): Payer: Self-pay | Admitting: Internal Medicine

## 2012-10-27 ENCOUNTER — Ambulatory Visit (HOSPITAL_COMMUNITY)
Admission: RE | Admit: 2012-10-27 | Discharge: 2012-10-27 | Disposition: A | Discharge status: Home / Self Care | Payer: PRIVATE HEALTH INSURANCE | Source: Ambulatory Visit | Attending: Internal Medicine | Admitting: Internal Medicine

## 2012-10-27 VITALS — BP 144/78 | HR 72 | Wt 270.1 lb

## 2012-10-27 DIAGNOSIS — I5023 Acute on chronic systolic (congestive) heart failure: Secondary | ICD-10-CM

## 2012-10-27 DIAGNOSIS — I129 Hypertensive chronic kidney disease with stage 1 through stage 4 chronic kidney disease, or unspecified chronic kidney disease: Secondary | ICD-10-CM | POA: Insufficient documentation

## 2012-10-27 DIAGNOSIS — M549 Dorsalgia, unspecified: Secondary | ICD-10-CM | POA: Insufficient documentation

## 2012-10-27 DIAGNOSIS — F1010 Alcohol abuse, uncomplicated: Secondary | ICD-10-CM | POA: Insufficient documentation

## 2012-10-27 DIAGNOSIS — Z87891 Personal history of nicotine dependence: Secondary | ICD-10-CM | POA: Insufficient documentation

## 2012-10-27 DIAGNOSIS — I509 Heart failure, unspecified: Secondary | ICD-10-CM | POA: Insufficient documentation

## 2012-10-27 DIAGNOSIS — N182 Chronic kidney disease, stage 2 (mild): Secondary | ICD-10-CM | POA: Insufficient documentation

## 2012-10-27 DIAGNOSIS — R609 Edema, unspecified: Secondary | ICD-10-CM | POA: Insufficient documentation

## 2012-10-27 DIAGNOSIS — Z8673 Personal history of transient ischemic attack (TIA), and cerebral infarction without residual deficits: Secondary | ICD-10-CM | POA: Insufficient documentation

## 2012-10-27 DIAGNOSIS — I517 Cardiomegaly: Secondary | ICD-10-CM

## 2012-10-27 DIAGNOSIS — K219 Gastro-esophageal reflux disease without esophagitis: Secondary | ICD-10-CM | POA: Insufficient documentation

## 2012-10-27 DIAGNOSIS — R0989 Other specified symptoms and signs involving the circulatory and respiratory systems: Secondary | ICD-10-CM | POA: Insufficient documentation

## 2012-10-27 DIAGNOSIS — F209 Schizophrenia, unspecified: Secondary | ICD-10-CM | POA: Insufficient documentation

## 2012-10-27 DIAGNOSIS — I5042 Chronic combined systolic (congestive) and diastolic (congestive) heart failure: Secondary | ICD-10-CM

## 2012-10-27 DIAGNOSIS — R0609 Other forms of dyspnea: Secondary | ICD-10-CM | POA: Insufficient documentation

## 2012-10-27 DIAGNOSIS — E785 Hyperlipidemia, unspecified: Secondary | ICD-10-CM | POA: Insufficient documentation

## 2012-10-27 DIAGNOSIS — Z8711 Personal history of peptic ulcer disease: Secondary | ICD-10-CM | POA: Insufficient documentation

## 2012-10-27 DIAGNOSIS — B1920 Unspecified viral hepatitis C without hepatic coma: Secondary | ICD-10-CM | POA: Insufficient documentation

## 2012-10-27 DIAGNOSIS — I10 Essential (primary) hypertension: Secondary | ICD-10-CM

## 2012-10-27 NOTE — Patient Instructions (Addendum)
Follow up in 3-4 months  Do the following things EVERYDAY: 1) Weigh yourself in the morning before breakfast. Write it down and keep it in a log. 2) Take your medicines as prescribed 3) Eat low salt foods-Limit salt (sodium) to 2000 mg per day.  4) Stay as active as you can everyday 5) Limit all fluids for the day to less than 2 liters 

## 2012-10-27 NOTE — Progress Notes (Signed)
  Echocardiogram 2D Echocardiogram has been performed.  DIVYAM, HARPER 10/27/2012, 8:40 AM

## 2012-10-27 NOTE — Progress Notes (Signed)
Patient ID: ESTIN LAMSON, male   DOB: 1955-05-25, 57 y.o.   MRN: [id number] Referring Physician: Dr. Silverio Decamp Primary Care: Internal Medicine clinic, Dr. Silverio Decamp Primary Cardiologist: Dr. Doylene Canard  GI: Dr Ardis Hughs.   HPI: Mr Spearin is a 42 y.o. 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  History of substance abuse but quit smoking cigarettes 2011  after smoking 25 years, alcohol use but not any more as well as history of cocaine use.  Had history of acute renal failure in setting of rhabdo requiring short term HD.    Admitted with new onset HF in April 2014.   He was diuresed and discharged home.  Discharge weight 256 pounds. Echo at the time showed EF 123456, grade 1 diastolic dysfunction.    Cath 04/27/12 [facility] RA = 4  RV = 45/3/4  PA = 42/9 (24)  PCW = 14  Fick cardiac output/index = 5.2/2.2  PVR = 1.9 [place]  SVR = 1656  FA sat = 96%  PA sat = 62%, 64%  Ao Pressure: 149/84 (11)  LV Pressure: 142/8/29  There was no signficant gradient across the aortic valve on pullback.  Arteries normal. LV gram EF= 35-40% global HK  Ad[facility] to [facility] 5/30 through 06/27/12 with GI bleed. EGD 06/25/12 multiple ulcers noted. Continued on protonix.   ECHO 10/27/12 EF 45-50%   He returns for follow up. Last visit labetalol was stopped again and carvedilol was increased to 18.75 mg twice a day. Complaining of back pain. He was not been weighing at home. Huge appetite. Drinking > 2 liters of fluid. Eating high salt foods such as bologna. He has not been exercising. He has an aide 2-3 hours per day. Requires assistance with transportation.    Review of Systems: All pertinent positives and negatives as in HPI, otherwise negative.    Past Medical History  Diagnosis Date  . GERD (gastroesophageal reflux disease)   . Schizophrenia, schizo-affective   . Cataract   . Hyperlipidemia   . Wears glasses   . Wears dentures     upper   . Hypertension     uncontrolled with medication noncompliance  . Hepatitis C   . HIT (heparin-induced thrombocytopenia)   . Continuous chronic alcoholism   . H/O cocaine abuse   . Stroke 01/2012    Small cerebellar infarcts right greater than left as well as questionable acute left external capsule and caudate nuclear punctate lacunar infarcts noted per MRI (01/2012) - presumed to be embolic likely source PFO with right to left shunt (noted per TEE 01/ 2014)  . Chronic combined systolic and diastolic CHF (congestive heart failure) 02/29/2012    LV EF 40-45% per 2D echo (02/2012) with grade 1 diastolic dysfunction. 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 Degenerative lumbar spinal stenosis     s/p L2-3, L3-4, L4-5 laminectomy partial facetectomy, and bilateral foraminotomy  . History of pancreatitis 01/2011    Admission for acute pancreatitis presumed 2/2 ongoing alcohol abuse and hypertriglyceridemia  . PFO (patent foramen ovale) 01/2012    with right to left shunt, noted per TEE in evaluation for source of embolic stroke in [id number]  . CKD (chronic kidney disease) stage 2, GFR 60-89 ml/min     BL SCr approximately 1-1.3  . Rhabdomyolysis 02/22/2012    H/O rhabdomyolysis in 01/2012 that was idiopathic, cause  never identified  . Hepatic steatosis     suspected 2/2 alcohol abuse  . Splenic cyst   . Colitis 05/2009    History of colitis of ascending colon noted on CT abd/pelvis (05/2009), with interval resolution with subsequent CT  . Hypertriglyceridemia     Current Outpatient Prescriptions  Medication Sig Dispense Refill  . carvedilol (COREG) 12.5 MG tablet Take 1.5 tablets (18.75 mg total) by mouth 2 (two) times daily with a meal.  90 tablet  3  . cloNIDine (CATAPRES) 0.2 MG tablet Take 0.2 mg by mouth 2 (two) times daily.      . CRESTOR 20 MG tablet take 1 tablet by mouth once daily  30 tablet  3  . dicyclomine (BENTYL)  20 MG tablet Take 1 tablet (20 mg total) by mouth every 6 (six) hours as needed (for abdominal cramping).  20 tablet  0  . furosemide (LASIX) 40 MG tablet Take 80 mg by mouth 2 (two) times daily.      Marland Kitchen ketoconazole (NIZORAL) 2 % cream Apply 1 application topically daily.      Marland Kitchen lisinopril (PRINIVIL,ZESTRIL) 20 MG tablet Take 0.5-1 tablets (10-20 mg total) by mouth 2 (two) times daily. Take 10mg  in AM and 20mg  in PM  45 tablet  4  . pantoprazole (PROTONIX) 40 MG tablet Take 40 mg by mouth 2 (two) times daily.      . potassium chloride (K-DUR) 10 MEQ tablet Take 10 mEq by mouth daily.      . traMADol (ULTRAM) 50 MG tablet TAKE 1 TABLET BY MOUTH  TWICE DAILY AS NEEDED FOR PAIN  60 tablet  0  . traZODone (DESYREL) 100 MG tablet Take 1 tablet (100 mg total) by mouth at bedtime.  30 tablet  2   No current facility-administered medications for this encounter.    Allergies  Allergen Reactions  . Thorazine [Chlorpromazine Hcl] Other (See Comments)    Body freezes up    PHYSICAL EXAM: Filed Vitals:   10/27/12 0902  BP: 144/78  Pulse: 72  Weight: 270 lb 1.9 oz (122.526 kg)  SpO2: 94%    General:  NAD. No respiratory difficulty HEENT: normal Neck: supple. JVP~10.  Carotids 2+ bilat; no bruits. No lymphadenopathy or thryomegaly appreciated. Cor: PMI nondisplaced. Regular rate & rhythm. No rubs, gallops, Very soft TR murmur. Lungs: clear Abdomen: soft, nontender, mild distention. No hepatosplenomegaly. No bruits or masses. Good bowel sounds. Extremities: no cyanosis, clubbing, rash, tr edema  Neuro: alert & oriented x 3, cranial nerves grossly intact. moves all 4 extremities w/o difficulty. Flat Affect    ASSESSMENT  1) Chronic systolic/diastolic HF due to NICM  2) HTN  PLAN: Dr Haroldine Laws reviewed and discussed ECHO EF recovered EF 45-50%  Volume status mildly elevated.  Continue 80 mg lasix twice a day Continue carvedilol 18.7 5 mg twice a day Continue lisinopril 10 mg in amand 20  mg in pm  Reinforced daily weights, low salt food choices, and medication compliance.  Follow up in 3 months  CLEGG,AMY,NP-C 9:27 AM  Patient seen and examined with Darrick Grinder, NP. We discussed all aspects of the encounter. I agree with the assessment and plan as stated above.  I have reviewed echo personally and EF now near normal. Weight up but volume does not look so bad on exam. He is very sedentary and continues to ask if he is really ready to go back to work. I feel he is stable to return to work. Agree  with titrating meds for HF and HTN.  Tiea Manninen,MD 3:19 PM  Addendum:  EF read formally by Dr. Meda Coffee as 40-45%. Will review echo with her.   Lacrystal Barbe,MD 3:20 PM

## 2012-11-11 ENCOUNTER — Telehealth: Payer: Self-pay | Admitting: *Deleted

## 2012-11-11 NOTE — Telephone Encounter (Signed)
Have him come in to be seen, I'm not comfortable prescribing him narcotics.

## 2012-11-11 NOTE — Telephone Encounter (Signed)
Call from patient.  Referral to the [facility] was denied.  Pt said that he is still in pain and needs something until he can get n appointment with another Pain Center.  Information to sent to [facility] and [facility].  Sander Nephew, RN 11/11/2012 11:23 AM

## 2012-11-14 ENCOUNTER — Telehealth (HOSPITAL_COMMUNITY): Payer: Self-pay | Admitting: Cardiology

## 2012-11-14 MED ORDER — METOLAZONE 2.5 MG PO TABS: 2.5000 mg | ORAL_TABLET | ORAL | Status: DC

## 2012-11-14 NOTE — Telephone Encounter (Signed)
Spoke w/pt he states wt today is 276 lb, last week he thinks it was around 256-257, at last OV 10/2 wt was 270, he is more SOB than usual and states he feels like feet and legs are swollen, he is taking Lasix 80 mg bid and took an extra 80 mg at lunch yesterday but doesn't feel like it helped any, will discuss with MD and call him back

## 2012-11-14 NOTE — Telephone Encounter (Signed)
Pt called with concerns of weight gain on his home scale Normally he weighs 240 today his weight is 260lbs Increased SOB and edema  Please advise

## 2012-11-14 NOTE — Telephone Encounter (Signed)
Discussed w/Ali Boyce Medici, NP will give pt metolazone 2.5 mg for 2 days along with extra 20 meq of KCL, pt aware and verbalizes understanding, if not feeling better or wt not coming down will call us back, will call pt on Wed to check on him and sch labs

## 2012-11-22 ENCOUNTER — Other Ambulatory Visit: Payer: Self-pay | Admitting: Urology

## 2012-11-24 NOTE — Telephone Encounter (Signed)
Have left pt several message to call back to check on wt but he has not called back

## 2012-11-28 ENCOUNTER — Other Ambulatory Visit (HOSPITAL_COMMUNITY): Payer: Self-pay | Admitting: Pain Medicine

## 2012-11-28 DIAGNOSIS — R200 Anesthesia of skin: Secondary | ICD-10-CM

## 2012-11-28 DIAGNOSIS — M79605 Pain in left leg: Secondary | ICD-10-CM

## 2012-11-28 DIAGNOSIS — M545 Low back pain: Secondary | ICD-10-CM

## 2012-12-01 ENCOUNTER — Ambulatory Visit (HOSPITAL_COMMUNITY)
Admission: RE | Admit: 2012-12-01 | Discharge: 2012-12-01 | Disposition: A | Discharge status: Home / Self Care | Payer: PRIVATE HEALTH INSURANCE | Source: Ambulatory Visit | Attending: Pain Medicine | Admitting: Pain Medicine

## 2012-12-01 DIAGNOSIS — Z981 Arthrodesis status: Secondary | ICD-10-CM | POA: Insufficient documentation

## 2012-12-01 DIAGNOSIS — M79605 Pain in left leg: Secondary | ICD-10-CM

## 2012-12-01 DIAGNOSIS — M545 Low back pain, unspecified: Secondary | ICD-10-CM | POA: Insufficient documentation

## 2012-12-01 DIAGNOSIS — M5126 Other intervertebral disc displacement, lumbar region: Secondary | ICD-10-CM | POA: Insufficient documentation

## 2012-12-01 DIAGNOSIS — R200 Anesthesia of skin: Secondary | ICD-10-CM

## 2012-12-01 LAB — CREATININE, SERUM
Creatinine, Ser: 0.96 mg/dL (ref 0.50–1.35)
GFR calc Af Amer: >90 mL/min (ref >90)
GFR calc non Af Amer: 90 mL/min — ABNORMAL LOW (ref >90)

## 2012-12-01 MED ORDER — GADOBENATE DIMEGLUMINE 529 MG/ML IV SOLN
20.0000 mL | Freq: Once | INTRAVENOUS | Status: AC
  Administered 2012-12-01: 20 mL via INTRAVENOUS

## 2012-12-08 ENCOUNTER — Encounter (HOSPITAL_COMMUNITY): Payer: Self-pay | Admitting: Pharmacy Technician

## 2012-12-09 NOTE — Patient Instructions (Addendum)
20      Your procedure is scheduled on:  Friday 12/16/2012  Report to [facility] at [place]  AM.  Call this number if you have problems the night before or morning of surgery: [phone]   Remember:             IF YOU USE CPAP,BRING MASK AND TUBING AM OF SURGERY!   Do not eat food or drink liquids AFTER MIDNIGHT!  Take these medicines the morning of surgery with A SIP OF WATER: Coreg, Clonidine, Pantoprazole   Do not bring valuables to the hospital. [place].  Marland Kitchen  Leave suitcase in the car. After surgery it may be brought to your room.  For patients admitted to the hospital, checkout time is 11:00 AM the day of              Discharge.    DO NOT WEAR JEWELRY,MAKE-UP,LOTIONS,POWDERS,PERFUMES,CONTACTS , DENTURES OR BRIDGEWORK ,AND DO NOT WEAR FALSE EYELASHES                                    Patients discharged the day of surgery will not be allowed to drive home. If going home the same day of surgery, must have someone stay with you first 24 hrs.at home and arrange for someone to drive you home from the [place]:   Special Instructions: IF YOU DO NOT HAVE YOUR TYPE AND SCREEN DRAWN THE DAY OF YOU PRE-OP VISIT, YOU WILL HAVE IT DRAWN THE AM OF SURGERY!             Please read over the following fact sheets that you were given:             1. [place] M.Courtenay Creger,RN,BSN     [phone]                FAILURE TO FOLLOW THESE INSTRUCTIONS MAY RESULT IN CANCELLATION OF YOUR SURGERY!               Patient Signature:___________________________

## 2012-12-12 ENCOUNTER — Inpatient Hospital Stay (HOSPITAL_COMMUNITY)
Admission: RE | Admit: 2012-12-12 | Discharge: 2012-12-12 | Disposition: A | Discharge status: Home / Self Care | Payer: PRIVATE HEALTH INSURANCE | Source: Ambulatory Visit

## 2012-12-12 NOTE — Progress Notes (Signed)
Called patient as he did not come for appointment at 1000am and patient states he went to [facility] and would like an appointment for tomorrow.

## 2012-12-13 ENCOUNTER — Encounter (INDEPENDENT_AMBULATORY_CARE_PROVIDER_SITE_OTHER): Payer: Self-pay

## 2012-12-13 ENCOUNTER — Encounter (HOSPITAL_COMMUNITY)
Admission: RE | Admit: 2012-12-13 | Discharge: 2012-12-13 | Disposition: A | Discharge status: Home / Self Care | Payer: PRIVATE HEALTH INSURANCE | Source: Ambulatory Visit | Attending: Urology | Admitting: Urology

## 2012-12-13 ENCOUNTER — Encounter (HOSPITAL_COMMUNITY): Payer: Self-pay

## 2012-12-13 HISTORY — DX: Malignant (primary) neoplasm, unspecified: C80.1

## 2012-12-13 LAB — COMPREHENSIVE METABOLIC PANEL
AST: 26 U/L (ref 0–37)
Albumin: 4 g/dL (ref 3.5–5.2)
Calcium: 10.1 mg/dL (ref 8.4–10.5)
Chloride: 101 mEq/L (ref 96–112)
Creatinine, Ser: 0.94 mg/dL (ref 0.50–1.35)
Glucose, Bld: 129 mg/dL — ABNORMAL HIGH (ref 70–99)
Potassium: 3.7 mEq/L (ref 3.5–5.1)
Total Bilirubin: 0.3 mg/dL (ref 0.3–1.2)
Total Protein: 7.9 g/dL (ref 6.0–8.3)

## 2012-12-13 LAB — CBC
HCT: 41.7 % (ref 39.0–52.0)
Hemoglobin: 13.5 g/dL (ref 13.0–17.0)
MCHC: 32.4 g/dL (ref 30.0–36.0)
RBC: 5.18 MIL/uL (ref 4.22–5.81)
WBC: 4.9 10*3/uL (ref 4.0–10.5)

## 2012-12-13 LAB — SURGICAL PCR SCREEN: Staphylococcus aureus: POSITIVE — AB

## 2012-12-13 NOTE — Pre-Procedure Instructions (Addendum)
12-13-12 EKG/ CXR 9'14- Epic 12-13-12 Dr. Louis Meckel, and pt. Notified of Positive PCR screen for Staph aureus.W. Floy Sabina

## 2012-12-13 NOTE — Patient Instructions (Addendum)
20 [name] [name]  12/13/2012   Your procedure is scheduled on:  11-21 -2014  Report to [facility] at      [place]  AM..  Call this number if you have problems the morning of surgery: [phone]  Or Presurgical Testing [phone](Calder Oblinger)   Remember: Follow any bowel prep instructions per MD office.    Do not eat food:After Midnight.  May have clear liquids:up to 6 Hours before arrival. Nothing after :    Take these medicines the morning of surgery with A SIP OF WATER: Carvedilol. Clonidine. Pantoprazole.Crestor.   Do not wear jewelry, make-up or nail polish.  Do not wear lotions, powders, or perfumes. You may wear deodorant.  Do not shave 12 hours prior to first CHG shower(legs and under arms).(face and neck okay.)  Do not bring valuables to the hospital.  Contacts, dentures or removable bridgework, body piercing, hair pins may not be worn into surgery.  Leave suitcase in the car. After surgery it may be brought to your room.  For patients admitted to the hospital, checkout time is 11:00 AM the day of discharge.   Patients discharged the day of surgery will not be allowed to drive home. Must have responsible person with you x 24 hours once discharged.  Name and phone number of your driver: Khamani Fabry, sister [phone]   Special Instructions: CHG(Chlorhedine 4%-"Hibiclens","Betasept","Aplicare") Shower Use Special Wash: see special instructions.(avoid face and genitals)   Please read over the following fact sheets that you were given: MRSA Information, Blood Transfusion fact sheet, Incentive Spirometry Instruction.  Remember : Type/Screen "Blue armbands" - may not be removed once applied(would result in being retested if removed).  Failure to follow these instructions may result in Cancellation of your surgery.   Patient signature_______________________________________________________

## 2012-12-13 NOTE — Progress Notes (Signed)
Your Pt has screened with an elevated risk for obstructive sleep apnea using the Stop-Bang tool during a presurgical  Visit. A score of four or greater is an elevated risk. 

## 2012-12-13 NOTE — Progress Notes (Signed)
12-13-12 1445 Pt. Screened Positive for Staph aureus today will nee Mupirocin Ointment called to [facility], [address] [phone].

## 2012-12-15 LAB — URINE CULTURE
Colony Count: NO GROWTH
Culture: NO GROWTH

## 2012-12-15 MED ORDER — CEFAZOLIN SODIUM 10 G IJ SOLR
3.0000 g | INTRAMUSCULAR | Status: AC
  Administered 2012-12-16 (×2): 3 g via INTRAVENOUS
  Filled 2012-12-15: qty 3000

## 2012-12-16 ENCOUNTER — Encounter (HOSPITAL_COMMUNITY)
Admission: RE | Disposition: A | Discharge status: Home / Self Care | Payer: Self-pay | Source: Ambulatory Visit | Attending: Urology

## 2012-12-16 ENCOUNTER — Inpatient Hospital Stay (HOSPITAL_COMMUNITY): Payer: PRIVATE HEALTH INSURANCE | Admitting: Registered Nurse

## 2012-12-16 ENCOUNTER — Inpatient Hospital Stay (HOSPITAL_COMMUNITY)
Admission: RE | Admit: 2012-12-16 | Discharge: 2012-12-17 | DRG: 707 | Disposition: A | Discharge status: Home / Self Care | Payer: PRIVATE HEALTH INSURANCE | Source: Ambulatory Visit | Attending: Urology | Admitting: Urology

## 2012-12-16 ENCOUNTER — Encounter (HOSPITAL_COMMUNITY): Payer: PRIVATE HEALTH INSURANCE | Admitting: Registered Nurse

## 2012-12-16 ENCOUNTER — Encounter (HOSPITAL_COMMUNITY): Payer: Self-pay | Admitting: *Deleted

## 2012-12-16 DIAGNOSIS — F411 Generalized anxiety disorder: Secondary | ICD-10-CM | POA: Diagnosis present

## 2012-12-16 DIAGNOSIS — Z8042 Family history of malignant neoplasm of prostate: Secondary | ICD-10-CM

## 2012-12-16 DIAGNOSIS — F329 Major depressive disorder, single episode, unspecified: Secondary | ICD-10-CM | POA: Diagnosis present

## 2012-12-16 DIAGNOSIS — E7800 Pure hypercholesterolemia, unspecified: Secondary | ICD-10-CM | POA: Diagnosis present

## 2012-12-16 DIAGNOSIS — I129 Hypertensive chronic kidney disease with stage 1 through stage 4 chronic kidney disease, or unspecified chronic kidney disease: Secondary | ICD-10-CM | POA: Diagnosis present

## 2012-12-16 DIAGNOSIS — D75829 Heparin-induced thrombocytopenia, unspecified: Secondary | ICD-10-CM | POA: Diagnosis present

## 2012-12-16 DIAGNOSIS — Z8673 Personal history of transient ischemic attack (TIA), and cerebral infarction without residual deficits: Secondary | ICD-10-CM | POA: Diagnosis not present

## 2012-12-16 DIAGNOSIS — Z6836 Body mass index (BMI) 36.0-36.9, adult: Secondary | ICD-10-CM | POA: Diagnosis not present

## 2012-12-16 DIAGNOSIS — N529 Male erectile dysfunction, unspecified: Secondary | ICD-10-CM | POA: Diagnosis present

## 2012-12-16 DIAGNOSIS — C61 Malignant neoplasm of prostate: Secondary | ICD-10-CM | POA: Diagnosis present

## 2012-12-16 DIAGNOSIS — I771 Stricture of artery: Secondary | ICD-10-CM | POA: Diagnosis present

## 2012-12-16 DIAGNOSIS — K7030 Alcoholic cirrhosis of liver without ascites: Secondary | ICD-10-CM | POA: Diagnosis present

## 2012-12-16 DIAGNOSIS — K219 Gastro-esophageal reflux disease without esophagitis: Secondary | ICD-10-CM | POA: Diagnosis present

## 2012-12-16 DIAGNOSIS — M129 Arthropathy, unspecified: Secondary | ICD-10-CM | POA: Diagnosis present

## 2012-12-16 DIAGNOSIS — I509 Heart failure, unspecified: Secondary | ICD-10-CM | POA: Diagnosis present

## 2012-12-16 DIAGNOSIS — Z01812 Encounter for preprocedural laboratory examination: Secondary | ICD-10-CM | POA: Diagnosis not present

## 2012-12-16 DIAGNOSIS — I5042 Chronic combined systolic (congestive) and diastolic (congestive) heart failure: Secondary | ICD-10-CM | POA: Diagnosis present

## 2012-12-16 DIAGNOSIS — N182 Chronic kidney disease, stage 2 (mild): Secondary | ICD-10-CM | POA: Diagnosis present

## 2012-12-16 DIAGNOSIS — D7582 Heparin induced thrombocytopenia (HIT): Secondary | ICD-10-CM | POA: Diagnosis present

## 2012-12-16 DIAGNOSIS — Z87891 Personal history of nicotine dependence: Secondary | ICD-10-CM | POA: Diagnosis not present

## 2012-12-16 DIAGNOSIS — M109 Gout, unspecified: Secondary | ICD-10-CM | POA: Diagnosis present

## 2012-12-16 DIAGNOSIS — F1020 Alcohol dependence, uncomplicated: Secondary | ICD-10-CM | POA: Diagnosis present

## 2012-12-16 DIAGNOSIS — F3289 Other specified depressive episodes: Secondary | ICD-10-CM | POA: Diagnosis present

## 2012-12-16 HISTORY — PX: ROBOT ASSISTED LAPAROSCOPIC RADICAL PROSTATECTOMY: SHX5141

## 2012-12-16 LAB — CBC
HCT: 44 % (ref 39.0–52.0)
MCHC: 32.7 g/dL (ref 30.0–36.0)
MCV: 80.9 fL (ref 78.0–100.0)
RDW: 17.3 % — ABNORMAL HIGH (ref 11.5–15.5)
WBC: 12 10*3/uL — ABNORMAL HIGH (ref 4.0–10.5)

## 2012-12-16 LAB — TYPE AND SCREEN
ABO/RH(D): A NEG
Antibody Screen: NEGATIVE

## 2012-12-16 LAB — BASIC METABOLIC PANEL
BUN: 16 mg/dL (ref 6–23)
Calcium: 9.7 mg/dL (ref 8.4–10.5)
Chloride: 95 mEq/L — ABNORMAL LOW (ref 96–112)
Creatinine, Ser: 1.2 mg/dL (ref 0.50–1.35)
GFR calc Af Amer: 75 mL/min — ABNORMAL LOW (ref >90)
GFR calc non Af Amer: 65 mL/min — ABNORMAL LOW (ref >90)
Sodium: 134 mEq/L — ABNORMAL LOW (ref 135–145)

## 2012-12-16 LAB — RAPID URINE DRUG SCREEN, HOSP PERFORMED
Amphetamines: NOT DETECTED
Barbiturates: NOT DETECTED
Benzodiazepines: NOT DETECTED
Cocaine: NOT DETECTED
Opiates: NOT DETECTED
Tetrahydrocannabinol: NOT DETECTED

## 2012-12-16 SURGERY — ROBOTIC ASSISTED LAPAROSCOPIC RADICAL PROSTATECTOMY
Anesthesia: General | Wound class: Clean Contaminated

## 2012-12-16 MED ORDER — DSS 100 MG PO CAPS: 100.0000 mg | ORAL_CAPSULE | Freq: Two times a day (BID) | ORAL | Status: DC

## 2012-12-16 MED ORDER — HYDROMORPHONE HCL PF 1 MG/ML IJ SOLN
INTRAMUSCULAR | Status: AC
  Filled 2012-12-16: qty 1

## 2012-12-16 MED ORDER — LACTATED RINGERS IR SOLN
Status: DC | PRN
  Administered 2012-12-16: 1000 mL

## 2012-12-16 MED ORDER — MIDAZOLAM HCL 5 MG/5ML IJ SOLN
INTRAMUSCULAR | Status: DC | PRN
  Administered 2012-12-16: 2 mg via INTRAVENOUS

## 2012-12-16 MED ORDER — FENTANYL CITRATE 0.05 MG/ML IJ SOLN
INTRAMUSCULAR | Status: DC | PRN
  Administered 2012-12-16: 50 ug via INTRAVENOUS
  Administered 2012-12-16: 100 ug via INTRAVENOUS
  Administered 2012-12-16 (×2): 50 ug via INTRAVENOUS

## 2012-12-16 MED ORDER — DEXAMETHASONE SODIUM PHOSPHATE 10 MG/ML IJ SOLN
INTRAMUSCULAR | Status: AC
  Filled 2012-12-16: qty 1

## 2012-12-16 MED ORDER — FENTANYL CITRATE 0.05 MG/ML IJ SOLN
INTRAMUSCULAR | Status: AC
  Filled 2012-12-16: qty 2

## 2012-12-16 MED ORDER — FENTANYL CITRATE 0.05 MG/ML IJ SOLN
50.0000 ug | INTRAMUSCULAR | Status: DC | PRN
  Administered 2012-12-16 (×2): 50 ug via INTRAVENOUS

## 2012-12-16 MED ORDER — PANTOPRAZOLE SODIUM 40 MG PO TBEC
40.0000 mg | DELAYED_RELEASE_TABLET | Freq: Two times a day (BID) | ORAL | Status: DC
  Administered 2012-12-16 – 2012-12-17 (×2): 40 mg via ORAL
  Filled 2012-12-16 (×3): qty 1

## 2012-12-16 MED ORDER — OXYCODONE-ACETAMINOPHEN 5-325 MG PO TABS: 2.0000 | ORAL_TABLET | ORAL | Status: DC | PRN

## 2012-12-16 MED ORDER — HYDROMORPHONE HCL PF 1 MG/ML IJ SOLN
0.2500 mg | INTRAMUSCULAR | Status: DC | PRN
  Administered 2012-12-16 (×2): 0.5 mg via INTRAVENOUS

## 2012-12-16 MED ORDER — DOCUSATE SODIUM 100 MG PO CAPS
100.0000 mg | ORAL_CAPSULE | Freq: Two times a day (BID) | ORAL | Status: DC
  Administered 2012-12-16 – 2012-12-17 (×2): 100 mg via ORAL
  Filled 2012-12-16 (×3): qty 1

## 2012-12-16 MED ORDER — HYDROMORPHONE HCL PF 1 MG/ML IJ SOLN
INTRAMUSCULAR | Status: AC
  Administered 2012-12-16: 1 mg
  Filled 2012-12-16: qty 1

## 2012-12-16 MED ORDER — KETOROLAC TROMETHAMINE 15 MG/ML IJ SOLN
15.0000 mg | Freq: Four times a day (QID) | INTRAMUSCULAR | Status: DC
  Administered 2012-12-16 – 2012-12-17 (×5): 15 mg via INTRAVENOUS
  Filled 2012-12-16 (×5): qty 1

## 2012-12-16 MED ORDER — GLYCOPYRROLATE 0.2 MG/ML IJ SOLN
INTRAMUSCULAR | Status: AC
  Filled 2012-12-16: qty 4

## 2012-12-16 MED ORDER — HYDROMORPHONE HCL PF 1 MG/ML IJ SOLN
0.5000 mg | INTRAMUSCULAR | Status: DC | PRN
  Administered 2012-12-16 – 2012-12-17 (×4): 1 mg via INTRAVENOUS
  Filled 2012-12-16 (×4): qty 1

## 2012-12-16 MED ORDER — LIDOCAINE HCL (CARDIAC) 20 MG/ML IV SOLN
INTRAVENOUS | Status: AC
  Filled 2012-12-16: qty 5

## 2012-12-16 MED ORDER — LACTATED RINGERS IV SOLN
INTRAVENOUS | Status: DC | PRN
  Administered 2012-12-16: 07:00:00 via INTRAVENOUS

## 2012-12-16 MED ORDER — METOLAZONE 2.5 MG PO TABS
2.5000 mg | ORAL_TABLET | Freq: Two times a day (BID) | ORAL | Status: DC
  Administered 2012-12-16 – 2012-12-17 (×2): 2.5 mg via ORAL
  Filled 2012-12-16 (×3): qty 1

## 2012-12-16 MED ORDER — KETOROLAC TROMETHAMINE 15 MG/ML IJ SOLN
INTRAMUSCULAR | Status: AC
  Filled 2012-12-16: qty 1

## 2012-12-16 MED ORDER — CIPROFLOXACIN IN D5W 400 MG/200ML IV SOLN
400.0000 mg | INTRAVENOUS | Status: AC
  Administered 2012-12-16: 400 mg via INTRAVENOUS

## 2012-12-16 MED ORDER — PHENYLEPHRINE HCL 10 MG/ML IJ SOLN
20.0000 mg | INTRAVENOUS | Status: DC | PRN
  Administered 2012-12-16: 10 ug/min via INTRAVENOUS

## 2012-12-16 MED ORDER — ROCURONIUM BROMIDE 100 MG/10ML IV SOLN
INTRAVENOUS | Status: AC
  Filled 2012-12-16: qty 1

## 2012-12-16 MED ORDER — DEXTROSE 5 % IV SOLN
3.0000 g | Freq: Once | INTRAVENOUS | Status: DC
  Filled 2012-12-16: qty 3000

## 2012-12-16 MED ORDER — ONDANSETRON HCL 4 MG/2ML IJ SOLN: 4.0000 mg | INTRAMUSCULAR | Status: DC | PRN

## 2012-12-16 MED ORDER — DEXTROSE-NACL 5-0.45 % IV SOLN
INTRAVENOUS | Status: DC
  Administered 2012-12-16 – 2012-12-17 (×2): via INTRAVENOUS

## 2012-12-16 MED ORDER — GLYCOPYRROLATE 0.2 MG/ML IJ SOLN
INTRAMUSCULAR | Status: DC | PRN
  Administered 2012-12-16: 0.2 mg via INTRAVENOUS
  Administered 2012-12-16: .8 mg via INTRAVENOUS

## 2012-12-16 MED ORDER — PROPOFOL 10 MG/ML IV BOLUS
INTRAVENOUS | Status: DC | PRN
  Administered 2012-12-16: 200 mg via INTRAVENOUS

## 2012-12-16 MED ORDER — PROPOFOL 10 MG/ML IV BOLUS
INTRAVENOUS | Status: AC
  Filled 2012-12-16: qty 20

## 2012-12-16 MED ORDER — DICYCLOMINE HCL 20 MG PO TABS
20.0000 mg | ORAL_TABLET | Freq: Four times a day (QID) | ORAL | Status: DC | PRN
  Filled 2012-12-16: qty 1

## 2012-12-16 MED ORDER — NEOSTIGMINE METHYLSULFATE 1 MG/ML IJ SOLN
INTRAMUSCULAR | Status: DC | PRN
  Administered 2012-12-16: 5 mg via INTRAVENOUS

## 2012-12-16 MED ORDER — BUPIVACAINE LIPOSOME 1.3 % IJ SUSP
INTRAMUSCULAR | Status: DC | PRN
  Administered 2012-12-16: 20 mL

## 2012-12-16 MED ORDER — LIDOCAINE HCL (CARDIAC) 20 MG/ML IV SOLN
INTRAVENOUS | Status: DC | PRN
  Administered 2012-12-16: 100 mg via INTRAVENOUS

## 2012-12-16 MED ORDER — FUROSEMIDE 80 MG PO TABS
80.0000 mg | ORAL_TABLET | Freq: Two times a day (BID) | ORAL | Status: DC
Start: 2012-12-16 — End: 2012-12-17
  Administered 2012-12-16 – 2012-12-17 (×2): 80 mg via ORAL
  Filled 2012-12-16 (×5): qty 1

## 2012-12-16 MED ORDER — SUCCINYLCHOLINE CHLORIDE 20 MG/ML IJ SOLN
INTRAMUSCULAR | Status: DC | PRN
  Administered 2012-12-16: 100 mg via INTRAVENOUS

## 2012-12-16 MED ORDER — ARIPIPRAZOLE 10 MG PO TABS
10.0000 mg | ORAL_TABLET | Freq: Every day | ORAL | Status: DC
  Administered 2012-12-16: 22:00:00 10 mg via ORAL
  Filled 2012-12-16 (×2): qty 1

## 2012-12-16 MED ORDER — BUPIVACAINE LIPOSOME 1.3 % IJ SUSP
20.0000 mL | Freq: Once | INTRAMUSCULAR | Status: DC
  Filled 2012-12-16: qty 20

## 2012-12-16 MED ORDER — TRAZODONE HCL 100 MG PO TABS
100.0000 mg | ORAL_TABLET | Freq: Every day | ORAL | Status: DC
  Administered 2012-12-16: 22:00:00 100 mg via ORAL
  Filled 2012-12-16 (×2): qty 1

## 2012-12-16 MED ORDER — SODIUM CHLORIDE 0.9 % IJ SOLN
INTRAMUSCULAR | Status: AC
  Filled 2012-12-16: qty 20

## 2012-12-16 MED ORDER — OXYCODONE-ACETAMINOPHEN 5-325 MG PO TABS: 1.0000 | ORAL_TABLET | ORAL | Status: DC | PRN

## 2012-12-16 MED ORDER — DEXAMETHASONE SODIUM PHOSPHATE 10 MG/ML IJ SOLN
INTRAMUSCULAR | Status: DC | PRN
  Administered 2012-12-16: 10 mg via INTRAVENOUS

## 2012-12-16 MED ORDER — PHENYLEPHRINE HCL 10 MG/ML IJ SOLN
INTRAMUSCULAR | Status: AC
  Filled 2012-12-16: qty 2

## 2012-12-16 MED ORDER — ATORVASTATIN CALCIUM 40 MG PO TABS
40.0000 mg | ORAL_TABLET | Freq: Every day | ORAL | Status: DC
  Administered 2012-12-16: 17:00:00 40 mg via ORAL
  Filled 2012-12-16 (×2): qty 1

## 2012-12-16 MED ORDER — NEOSTIGMINE METHYLSULFATE 1 MG/ML IJ SOLN
INTRAMUSCULAR | Status: AC
  Filled 2012-12-16: qty 10

## 2012-12-16 MED ORDER — BACITRACIN-NEOMYCIN-POLYMYXIN 400-5-5000 EX OINT: 1.0000 "application " | TOPICAL_OINTMENT | Freq: Three times a day (TID) | CUTANEOUS | Status: DC | PRN

## 2012-12-16 MED ORDER — LISINOPRIL 20 MG PO TABS
20.0000 mg | ORAL_TABLET | Freq: Two times a day (BID) | ORAL | Status: DC
  Administered 2012-12-16 – 2012-12-17 (×2): 20 mg via ORAL
  Filled 2012-12-16 (×3): qty 1

## 2012-12-16 MED ORDER — ZOLPIDEM TARTRATE 5 MG PO TABS: 5.0000 mg | ORAL_TABLET | Freq: Every evening | ORAL | Status: DC | PRN

## 2012-12-16 MED ORDER — CIPROFLOXACIN IN D5W 400 MG/200ML IV SOLN
INTRAVENOUS | Status: AC
  Filled 2012-12-16: qty 200

## 2012-12-16 MED ORDER — POTASSIUM CHLORIDE ER 10 MEQ PO TBCR
10.0000 meq | EXTENDED_RELEASE_TABLET | Freq: Two times a day (BID) | ORAL | Status: DC
  Administered 2012-12-17: 10 meq via ORAL
  Filled 2012-12-16 (×3): qty 1

## 2012-12-16 MED ORDER — MUPIROCIN 2 % EX OINT
TOPICAL_OINTMENT | Freq: Two times a day (BID) | CUTANEOUS | Status: DC
  Administered 2012-12-16: 1 via NASAL
  Filled 2012-12-16: qty 22

## 2012-12-16 MED ORDER — HYDROMORPHONE HCL PF 1 MG/ML IJ SOLN
INTRAMUSCULAR | Status: DC | PRN
  Administered 2012-12-16 (×3): 0.5 mg via INTRAVENOUS

## 2012-12-16 MED ORDER — CARVEDILOL 6.25 MG PO TABS
18.7500 mg | ORAL_TABLET | Freq: Two times a day (BID) | ORAL | Status: DC
  Administered 2012-12-16 – 2012-12-17 (×2): 18.75 mg via ORAL
  Filled 2012-12-16 (×5): qty 1

## 2012-12-16 MED ORDER — FENTANYL CITRATE 0.05 MG/ML IJ SOLN
INTRAMUSCULAR | Status: AC
  Filled 2012-12-16: qty 5

## 2012-12-16 MED ORDER — CLONIDINE HCL 0.2 MG PO TABS
0.2000 mg | ORAL_TABLET | Freq: Two times a day (BID) | ORAL | Status: DC
Start: 2012-12-16 — End: 2012-12-17
  Administered 2012-12-16 – 2012-12-17 (×2): 0.2 mg via ORAL
  Filled 2012-12-16 (×3): qty 1

## 2012-12-16 MED ORDER — ONDANSETRON HCL 4 MG/2ML IJ SOLN
INTRAMUSCULAR | Status: AC
  Filled 2012-12-16: qty 2

## 2012-12-16 MED ORDER — PHENYLEPHRINE 40 MCG/ML (10ML) SYRINGE FOR IV PUSH (FOR BLOOD PRESSURE SUPPORT)
PREFILLED_SYRINGE | INTRAVENOUS | Status: AC
  Filled 2012-12-16: qty 10

## 2012-12-16 MED ORDER — ACETAMINOPHEN 10 MG/ML IV SOLN
1000.0000 mg | Freq: Four times a day (QID) | INTRAVENOUS | Status: DC
  Administered 2012-12-16 – 2012-12-17 (×3): 1000 mg via INTRAVENOUS
  Filled 2012-12-16 (×4): qty 100

## 2012-12-16 MED ORDER — STERILE WATER FOR IRRIGATION IR SOLN
Status: DC | PRN
  Administered 2012-12-16: 3000 mL

## 2012-12-16 MED ORDER — MIDAZOLAM HCL 2 MG/2ML IJ SOLN
INTRAMUSCULAR | Status: AC
  Filled 2012-12-16: qty 2

## 2012-12-16 MED ORDER — ONDANSETRON HCL 4 MG/2ML IJ SOLN
INTRAMUSCULAR | Status: DC | PRN
  Administered 2012-12-16 (×2): 4 mg via INTRAVENOUS

## 2012-12-16 MED ORDER — GLYCOPYRROLATE 0.2 MG/ML IJ SOLN
INTRAMUSCULAR | Status: AC
  Filled 2012-12-16: qty 1

## 2012-12-16 MED ORDER — ROCURONIUM BROMIDE 100 MG/10ML IV SOLN
INTRAVENOUS | Status: DC | PRN
  Administered 2012-12-16: 20 mg via INTRAVENOUS
  Administered 2012-12-16 (×8): 10 mg via INTRAVENOUS
  Administered 2012-12-16: 50 mg via INTRAVENOUS

## 2012-12-16 MED ORDER — HYDROMORPHONE HCL PF 2 MG/ML IJ SOLN
INTRAMUSCULAR | Status: AC
  Filled 2012-12-16: qty 1

## 2012-12-16 MED ORDER — SODIUM CHLORIDE 0.9 % IV BOLUS (SEPSIS)
1000.0000 mL | Freq: Once | INTRAVENOUS | Status: AC
  Administered 2012-12-16: 1000 mL via INTRAVENOUS

## 2012-12-16 MED ORDER — SUCCINYLCHOLINE CHLORIDE 20 MG/ML IJ SOLN
INTRAMUSCULAR | Status: AC
  Filled 2012-12-16: qty 1

## 2012-12-16 SURGICAL SUPPLY — 61 items
ADH SKN CLS APL DERMABOND .7 (GAUZE/BANDAGES/DRESSINGS)
CANISTER SUCTION 2500CC (MISCELLANEOUS) ×2 IMPLANT
CATH FOLEY 2WAY SLVR 18FR 30CC (CATHETERS) ×2 IMPLANT
CATH TIEMANN FOLEY 18FR 5CC (CATHETERS) ×2 IMPLANT
CHLORAPREP W/TINT 26ML (MISCELLANEOUS) ×2 IMPLANT
CLIP LIGATING HEM O LOK PURPLE (MISCELLANEOUS) ×4 IMPLANT
CLIP LIGATING HEMO LOK XL GOLD (MISCELLANEOUS) ×1 IMPLANT
CLIP LIGATING HEMO O LOK GREEN (MISCELLANEOUS) ×1 IMPLANT
CONT SPECI 4OZ STER CLIK (MISCELLANEOUS) ×1 IMPLANT
CORD HIGH FREQUENCY UNIPOLAR (ELECTROSURGICAL) ×2 IMPLANT
COVER SURGICAL LIGHT HANDLE (MISCELLANEOUS) ×2 IMPLANT
COVER TIP SHEARS 8 DVNC (MISCELLANEOUS) ×1 IMPLANT
COVER TIP SHEARS 8MM DA VINCI (MISCELLANEOUS) ×1
CUTTER ECHEON FLEX ENDO 45 340 (ENDOMECHANICALS) ×1 IMPLANT
DECANTER SPIKE VIAL GLASS SM (MISCELLANEOUS) ×1 IMPLANT
DERMABOND ADVANCED (GAUZE/BANDAGES/DRESSINGS)
DERMABOND ADVANCED .7 DNX12 (GAUZE/BANDAGES/DRESSINGS) ×1 IMPLANT
DRAPE SURG IRRIG POUCH 19X23 (DRAPES) ×2 IMPLANT
DRSG TEGADERM 2-3/8X2-3/4 SM (GAUZE/BANDAGES/DRESSINGS) ×4 IMPLANT
DRSG TEGADERM 4X4.75 (GAUZE/BANDAGES/DRESSINGS) ×2 IMPLANT
DRSG TEGADERM 6X8 (GAUZE/BANDAGES/DRESSINGS) ×4 IMPLANT
ELECT REM PT RETURN 9FT ADLT (ELECTROSURGICAL) ×2
ELECTRODE REM PT RTRN 9FT ADLT (ELECTROSURGICAL) ×1 IMPLANT
GAUZE SPONGE 2X2 8PLY STRL LF (GAUZE/BANDAGES/DRESSINGS) ×1 IMPLANT
GLOVE BIO SURGEON STRL SZ 6.5 (GLOVE) ×2 IMPLANT
GLOVE BIOGEL M STRL SZ7.5 (GLOVE) ×6 IMPLANT
GLOVE BIOGEL PI IND STRL 7.5 (GLOVE) IMPLANT
GLOVE BIOGEL PI IND STRL 8 (GLOVE) IMPLANT
GLOVE BIOGEL PI INDICATOR 7.5 (GLOVE) ×3
GLOVE BIOGEL PI INDICATOR 8 (GLOVE) ×3
GOWN PREVENTION PLUS LG XLONG (DISPOSABLE) ×2 IMPLANT
GOWN PREVENTION PLUS XLARGE (GOWN DISPOSABLE) ×2 IMPLANT
GOWN STRL REIN XL XLG (GOWN DISPOSABLE) ×5 IMPLANT
HOLDER FOLEY CATH W/STRAP (MISCELLANEOUS) ×2 IMPLANT
IV LACTATED RINGERS 1000ML (IV SOLUTION) ×1 IMPLANT
KIT ACCESSORY DA VINCI DISP (KITS) ×1
KIT ACCESSORY DVNC DISP (KITS) ×1 IMPLANT
KIT PROCEDURE DA VINCI SI (MISCELLANEOUS) ×1
KIT PROCEDURE DVNC SI (MISCELLANEOUS) ×1 IMPLANT
NDL INSUFFLATION 14GA 120MM (NEEDLE) ×1 IMPLANT
NEEDLE INSUFFLATION 14GA 120MM (NEEDLE) ×2 IMPLANT
NEEDLE SPNL 22GX7 SPINOC (NEEDLE) ×1 IMPLANT
PACK ROBOT UROLOGY CUSTOM (CUSTOM PROCEDURE TRAY) ×2 IMPLANT
PEN SKIN MARKING BROAD (MISCELLANEOUS) ×1 IMPLANT
RELOAD GREEN ECHELON 45 (STAPLE) ×1 IMPLANT
SET TUBE IRRIG SUCTION NO TIP (IRRIGATION / IRRIGATOR) ×2 IMPLANT
SOLUTION ELECTROLUBE (MISCELLANEOUS) ×2 IMPLANT
SPONGE GAUZE 2X2 STER 10/PKG (GAUZE/BANDAGES/DRESSINGS) ×1
SPONGE LAP 4X18 X RAY DECT (DISPOSABLE) ×1 IMPLANT
SUT ETHILON 3 0 PS 1 (SUTURE) ×2 IMPLANT
SUT MNCRL AB 4-0 PS2 18 (SUTURE) ×4 IMPLANT
SUT PDS AB 1 CT1 27 (SUTURE) ×2 IMPLANT
SUT VIC AB 3-0 SH 27 (SUTURE) ×4
SUT VIC AB 3-0 SH 27X BRD (SUTURE) IMPLANT
SUT VICRYL 0 UR6 27IN ABS (SUTURE) ×1 IMPLANT
SUT VLOC BARB 180 ABS3/0GR12 (SUTURE) ×6
SUTURE VLOC BRB 180 ABS3/0GR12 (SUTURE) IMPLANT
SYR 27GX1/2 1ML LL SAFETY (SYRINGE) ×2 IMPLANT
TOWEL OR NON WOVEN STRL DISP B (DISPOSABLE) ×2 IMPLANT
TROCAR 12M 150ML BLUNT (TROCAR) ×2 IMPLANT
WATER STERILE IRR 1500ML POUR (IV SOLUTION) ×2 IMPLANT

## 2012-12-16 NOTE — Op Note (Signed)
Preoperative diagnosis:  1. Prostate cancer   Postoperative diagnosis:  1. As above   Procedure: 1. Robotic assisted laparoscopic prostatectomy  Surgeon: Ardis Hughs, MD  Anesthesia: General  Complications: None  Intraoperative findings: Prominent vas deferens and seminal vesicles bilaterally. Prominent pubic tubercle and narrow pelvis. Watertight urethral vesicle anastomosis. Left-sided nerve sparing.  EBL: Minimal  Specimens: None  Indication: [name] [name] is a 57 y.o. patient with T1 C. prostate cancer with Gleason 3+4 adenocarcinoma bilaterally..  After reviewing the management options for treatment, he elected to proceed with the above surgical procedure(s). We have discussed the potential benefits and risks of the procedure, side effects of the proposed treatment, the likelihood of the patient achieving the goals of the procedure, and any potential problems that might occur during the procedure or recuperation. Informed consent has been obtained.  Description of procedure:  The patient was consented in the preoperative holding area. He is in brought back to the operating room placed the table in supine position. General anesthesia was then induced and endotracheal tube was inserted. He was then placed in dorsolithotomy position and placed in steep Trendelenburg. He was then prepped and draped in the routine sterile fashion. We then began by making a 12 mm incision infraumbilical midline incision the skin. Then placed a Veress needle into the peritoneal space and inflated the abdomen to 15 mm of mercury. Then placed a 12 mm trocar and inserted the 0 robotic lens.  We then placed 2 additional a millimeter trochars in the patient's left lower abdomen proximally 9 cm apart and 2 trochars on the patient's right lower abdomen, one was in a millimeter trocar and one most lateral was a 12 mm trocar which was used as the assistant port. A 5 mm trocar was placed by triangulating  the 2 right lateral ports as a second assistant port. These ports were all placed under visual guidance. Once the ports were noted to be satisfactory position the robot was docked. We started with the 0 lens, monopolar scissors and the right hand and the PK forceps the left hand as well as a fenestrated grasper as the third arm on the left-hand side.  We began our dissection the posterior plane incising the peritoneum at the level of the vas deferens. Isolated the left vas deferens and dissected it proximally towards the spermatic cord for 5 cm prior to ligating it. Then used this as traction to isolate the left the seminal vesicle which was then undressed bluntly, all vessels were cauterized with a combination of bipolar and the monopolar scissors. Once this had been dissected out laterally and posteriorly turned our attention to the anterior plane and freed the the left seminal vesicle from the surrounding tissues. We then turned our attention to the right side and similarly dissected out the right vas deferens in the right seminal vesicle. Once the SCDs had been freed turned our attention to the posterior plane and bluntly dissected the tissue between the rectum and the posterior wall of the prostate bluntly out towards the apex.  This point the bladder was taken down starting at the urachal remnant with a combination of both blunt dissection and sharp dissection with monopolar cautery the bladder was dropped down in the usual fashion to the medial umbilical ligaments laterally and the dorsal vein of the prostate anteriorly creating our space of Retzius. We then turned our attention to the endopelvic fascia which was incised laterally starting on the patient's right-hand side the levator muscles  were pushed off the prostate laterally up towards the dorsal vein complex on the right-hand side. This process was then repeated on the left-hand side and a nice notch was created for the dorsal vein stitch on each  side the dorsal vein. I then passed a 0 Vicryl on a CT1 needle around the dorsal venous complex tied off with a slipknot and then anchoring it to the periosteum of the pubic bone anteriorly suspending the dorsal venous complex.  We then located the bladder neck at the vesicoprostatic junction and the monopolar scissors dissected down through the perivesical tissues and the bladder neck down to the prostatic urethra. The catheter was then deflated and pulled through our urethral opening and then used to retract the prostate anteriorly for the posterior bladder neck dissection. Once through the bladder neck and into the posterior plane of the prostate the SVs were brought through the opening. The left pedicle was then isolated and systematically ligated with Weck clips and scissors. The nerve bundle was then peeled off the posterior lateral aspect of the prostate and bluntly dissected away off the prostate. This was then repeated on the right side, except due to the patient's cancer burden on the right no significant attempt was made to preserve the nerve bundle and a wide dissection was performed.  I then came down through the dorsal venous complex anteriorly down to the membranous urethra using the monopolar. Once down to the urethra the urethra was transected sharply and the apex of the prostate was then dissected off the levator and rectourethralis muscles. Once the apex of the prostate had been dissected free we came back to the base of the prostate and bluntly push the rectum and nerve vascular bundle off the prostate the patient's left and used clips on the patient's right to free the prostate. Once the prostate was free was placed in the Endo Catch bag and the string brought to the 5 mm port.  The pelvis was then irrigated with normal saline and noted to be relatively hemostatic.  Using the 3-0 v lock suture a Rocco stitch was performed pulling the bladder neck down to the urethral stump. The  vesicourethral anastomosis was then completed with 2 interlocking 3-0  V. lock sutures running the anastomosis in the 6:00 position to the 12:00 position on each side and then tying it off on the top. The final catheter was then passed through the patient's urethra and into the bladder and 120 cc was instilled into the bladder to test the anastomosis. As there was no leak a 49 [place] Blake drain was passed through the left lateral port and placed around the vesicourethral anastomosis. A 12 mm assistant port on the right lateral side was then closed with 0 Vicryl with the help of the [employer] needle. The 12 mm midline infraumbilical incision was then extended another centimeter taken down and the fascia opened to remove the Endo Catch bag with the prostate specimen. The fascia was then closed with a 0 Vicryl and all skin ports were closed with 4-0 Monocryl in a subcutaneous fashion. Dermabond glue was then applied to the incisions. The drain was then secured to the skin with a 0 nylon stitch and dressing applied.  At the end of the case all laps needles and sponges had been accounted for. There no immediate complications. The patient returned to the PACU in stable condition. Ardis Hughs, M.D.

## 2012-12-16 NOTE — Transfer of Care (Signed)
Immediate Anesthesia Transfer of Care Note  Patient: [name] [name]  Procedure(s) Performed: Procedure(s): ROBOTIC ASSISTED LAPAROSCOPIC PROSTATECTOMY  (N/A)  Patient Location: PACU  Anesthesia Type:General  Level of Consciousness: awake, alert , oriented and patient cooperative  Airway & Oxygen Therapy: Patient Spontanous Breathing and Patient connected to face mask oxygen  Post-op Assessment: Report given to PACU RN, Post -op Vital signs reviewed and stable and Patient moving all extremities  Post vital signs: Reviewed and stable  Complications: No apparent anesthesia complications

## 2012-12-16 NOTE — Anesthesia Preprocedure Evaluation (Addendum)
Anesthesia Evaluation  Patient identified by MRN, date of birth, ID band Patient awake    Reviewed: Allergy & Precautions, H&P , NPO status , Patient's Chart, lab work & pertinent test results, reviewed documented beta blocker date and time   Airway Mallampati: II TM Distance: >3 FB Neck ROM: full    Dental  (+) Edentulous Upper and Edentulous Lower   Pulmonary neg pulmonary ROS, former smoker,  breath sounds clear to auscultation  Pulmonary exam normal       Cardiovascular hypertension, Pt. on home beta blockers and Pt. on medications +CHF Rhythm:regular Rate:Normal  PFO. /Chronic combined systolic and diastolic heart failure. EF 40% and grade 1 diastolic dysfunction.  Uncontrolled htn   Neuro/Psych Schizophrenia CVA negative psych ROS   GI/Hepatic negative GI ROS, GERD-  Medicated and Controlled,(+) Cirrhosis -    substance abuse  alcohol use, Hepatitis -, C  Endo/Other  negative endocrine ROSMorbid obesity  Renal/GU Renal diseasenegative Renal ROSStage 2 chronic kidney disease  negative genitourinary   Musculoskeletal   Abdominal (+) + obese,   Peds  Hematology negative hematology ROS (+) Heparin induced thrombocytopenia   Anesthesia Other Findings   Reproductive/Obstetrics negative OB ROS                       Anesthesia Physical Anesthesia Plan  ASA: IV  Anesthesia Plan: General   Post-op Pain Management:    Induction: Intravenous  Airway Management Planned: Oral ETT  Additional Equipment:   Intra-op Plan:   Post-operative Plan: Extubation in OR  Informed Consent: I have reviewed the patients History and Physical, chart, labs and discussed the procedure including the risks, benefits and alternatives for the proposed anesthesia with the patient or authorized representative who has indicated his/her understanding and acceptance.   Dental Advisory Given  Plan Discussed with: CRNA  and Surgeon  Anesthesia Plan Comments:         Anesthesia Quick Evaluation

## 2012-12-16 NOTE — Progress Notes (Signed)
Utilization review completed.  

## 2012-12-16 NOTE — Anesthesia Postprocedure Evaluation (Signed)
  Anesthesia Post-op Note  Patient: [name] [name]  Procedure(s) Performed: Procedure(s) (LRB): ROBOTIC ASSISTED LAPAROSCOPIC PROSTATECTOMY  (N/A)  Patient Location: PACU  Anesthesia Type: General  Level of Consciousness: awake and alert   Airway and Oxygen Therapy: Patient Spontanous Breathing  Post-op Pain: mild  Post-op Assessment: Post-op Vital signs reviewed, Patient's Cardiovascular Status Stable, Respiratory Function Stable, Patent Airway and No signs of Nausea or vomiting  Last Vitals:  Filed Vitals:   12/16/12 1245  BP: 132/77  Pulse: 59  Temp:   Resp: 14    Post-op Vital Signs: stable   Complications: No apparent anesthesia complications

## 2012-12-16 NOTE — Brief Op Note (Signed)
12/16/2012  12:26 PM  PATIENT:  [name]  57 y.o. male  PRE-OPERATIVE DIAGNOSIS:  prostate cancer   POST-OPERATIVE DIAGNOSIS:  prostate cancer   PROCEDURE:  Procedure(s): ROBOTIC ASSISTED LAPAROSCOPIC PROSTATECTOMY  (N/A)  SURGEON:  Surgeon(s) and Role:    * Ardis Hughs, MD - Primary    * Alexis Frock, MD - Assisting  PHYSICIAN ASSISTANT:   ASSISTANTS: Dr. Phebe Colla, MD   ANESTHESIA:   general  EBL:  Total I/O In: -  Out: 125 [Blood:125]  BLOOD ADMINISTERED:none  DRAINS: (.) Jackson-Pratt drain(s) with closed bulb suction in the LLQ   LOCAL MEDICATIONS USED:  MARCAINE     SPECIMEN:  Source of Specimen:  prostate  DISPOSITION OF SPECIMEN:  PATHOLOGY  COUNTS:  YES  TOURNIQUET:  * No tourniquets in log *  DICTATION: .Dragon Dictation  PLAN OF CARE: Admit to inpatient   PATIENT DISPOSITION:  PACU - hemodynamically stable.   Delay start of Pharmacological VTE agent (>24hrs) due to surgical blood loss or risk of bleeding: yes

## 2012-12-16 NOTE — Preoperative (Signed)
Beta Blockers   Reason not to administer Beta Blockers:Coreg taken 12-16-12 at 0430

## 2012-12-16 NOTE — H&P (Signed)
Reason For Visit RALP discussion   History of Present Illness Patient is referred by Dr. Karsten Ro for discussion of Robotic Laparoscopic Prostatectomy     Elevated PSA: He was found in 1/14 had a PSA of 5.0. He had a brother who had prostate cancer.    He reports 2 months ago he had some hesitancy that was not associated with any dysuria. That resolved completely. He otherwise has no voiding complaints. He has no prior history of prostatitis or hematuria. His PSA elevation would be considered of mild-moderate severity with no modifying factors or associated signs and symptoms. (IPSS 12 with QoL 2) He states that he has some erectile dysfunction.    Biospy results:  gleason 3+4 = 7 on right side in 2 lateral cores (50% of core at apex) and one core on at the left apex (5%) and one core of the left base (20%)  gleason 3+3=6 at right base (10%) and mid right apex.   Past Medical History Problems  1. History of Anxiety (300.00) 2. History of Arthritis (V13.4) 3. History of Gout (274.9) 4. History of congestive heart disease (V12.59) 5. History of depression (V11.8) 6. History of esophageal reflux (V12.79) 7. History of hypercholesterolemia (V12.29) 8. History of hypertension (V12.59) 9. History of Renal failure (586) 10. History of Stroke syndrome (436)  Surgical History Problems  1. History of Biopsy Of The Prostate Needle 2. History of No Surgical Problems  Current Meds 1. Abilify TABS;  Therapy: (Recorded:15Apr2014) to Recorded 2. CloNIDine HCl - 0.2 MG Oral Tablet;  Therapy: (Recorded:15Apr2014) to Recorded 3. Coreg TABS (Carvedilol);  Therapy: (Recorded:15Apr2014) to Recorded 4. Folic Acid 1 MG Oral Tablet;  Therapy: (Recorded:15Apr2014) to Recorded 5. Furosemide 40 MG Oral Tablet;  Therapy: (Recorded:15Apr2014) to Recorded 6. Lisinopril 20 MG Oral Tablet;  Therapy: (Recorded:27Oct2014) to Recorded 7. Magnesium TABS;  Therapy: (Recorded:15Apr2014) to Recorded 8.  Potassium Citrate ER 10 MEQ (1080 MG) Oral Tablet Extended Release;  Therapy: (Recorded:15Apr2014) to Recorded 9. TraZODone HCl - 100 MG Oral Tablet;  Therapy: (Recorded:15Apr2014) to Recorded 10. Tricor 48 MG Oral Tablet (Fenofibrate);   Therapy: (Recorded:15Apr2014) to Recorded 11. Zestril 20 MG Oral Tablet (Lisinopril);   Therapy: (Recorded:15Apr2014) to Recorded  Allergies Medication  1. Thorazine TABS  Family History Problems  1. Family history of prostate cancer [id number] : Brother 2. Family history of Nephrolithiasis 3. Family history of Prostate Cancer (V16.42)   His brother had prostate cancer.  Social History Problems  1. Denied: History of Alcohol Use 2. Caffeine Use 3. Former smoker (V15.82)   1ppd x 3 years ago 4. Marital History - Single  Vitals Vital Signs [Data Includes: Last 1 Day]  Recorded: 27Oct2014 03:48PM  Height: 5 ft 11 in Weight: 263 lb  BMI Calculated: 36.68 BSA Calculated: 2.37 Blood Pressure: 120 / 78 Temperature: 97.4 F Heart Rate: 69  Results/Data Urine [Data Includes: Last 1 Day]   27Oct2014  COLOR YELLOW   APPEARANCE CLEAR   SPECIFIC GRAVITY 1.025   pH 5.5   GLUCOSE NEG mg/dL  BILIRUBIN NEG   KETONE TRACE mg/dL  BLOOD NEG   PROTEIN NEG mg/dL  UROBILINOGEN 0.2 mg/dL  NITRITE NEG   LEUKOCYTE ESTERASE NEG   Selected Results  PSA REFLEX TO FREE 07Oct2014 08:40AM Kathie Rhodes  SPECIMEN TYPE: BLOOD   Test Name Result Flag Reference  PSA 4.22 ng/mL H <=4.00  RESULT REPEATED AND VERIFIED. TEST METHODOLOGY: ECLIA PSA (ELECTROCHEMILUMINESCENCE IMMUNOASSAY)  PSA, FREE 0.37 ng/mL    PSA, %FREE 9 %  L > 25  PROBABILITY OF PROSTATE CANCER   (FOR MEN WITH NON-SUSPICIOUS DRE RESULTS AND PSA BETWEEN 4 AND   10 NG/ML, BY PATIENT AGE)     % FREE PSA                          PATIENT AGE                          50 TO 59 YEARS  60 TO 69 YEARS  >70 YEARS    <=10%                  49.2%           57.5%          64.5%    11 - 18%                26.9%           33.9%          40.8%    19 - 25%               18.3%           23.9%          29.7%    >25%                    9.1%           12.2%          15.8%    Partin table results: His probability of organ confined disease is 81% with a 14% probability of extracapsular extension, 1% probability of seminal vesicle involvement and a 2.2% probability of lymph node involvement. His 5 and 10 year progression free probability with radical prostatectomy is 93% and 90% respectively with a 5 year progression free probability with brachytherapy of 86%.  Assessment Assessed  1. 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 Follow-up Schedule Surgery Office  Follow-up  Status: Complete  Done: 27Oct2014 2. PT/OT Referral Referral  Referral - pre op for RALP  Status: Hold For - PreCert,Date of  Service,Physical Therapy  Requested for: [id number] Health Maintenance  3. UA With REFLEX; Status:Complete;   [id number] 02:09PM  Discussion/Summary Patient has considered his options and has decided on RALP.    We discussed prostatectomy and specifically robotic prostatectomy with bilateral pelvic lymphadenectomy. I showed the patient on their abdomen the approximately 6 small incision (trocar) sites as well as presumed extraction sites with robotic approach as well as possible open incision sites should open conversion be necessary. We discussed peri-operative risks including  bleeding, infection, deep vein thrombosis, pulmonary embolism, compartment syndrome, neuropathy / neuropraxia, heart attack, stroke, death, as well as long-term risks such as non-cure / need for additional therapy. We specifically addressed that  the procedure would compromise urinary control leading to stress incontinence which typically resolves with time and pelvic rehabilitation (Kegel's, etc..), but can sometimes be permanent and require additional therapy including surgery. We also specifically addressed sexual sequellae including significant erectile dysfunction which typically partially resolves with time but can also be permanent and require additional therapy including surgery.     We discussed the typical hospital course including usual 1-2 night hospitalization, discharge with foley catheter in place usually for 1-2 weeks before voiding trial as well as usually 2 week recovery until able to perform most non-strenuous activity and 6 weeks until able to return to most jobs and more strenuous activity such as exercise.     We will get him to see PT prior to surgery as well as catheter education with the nurses from the hospital. All questions were answered. We'll get the patient scheduled at his earliest convenience.

## 2012-12-17 LAB — CBC
Hemoglobin: 12.2 g/dL — ABNORMAL LOW (ref 13.0–17.0)
MCH: 25.7 pg — ABNORMAL LOW (ref 26.0–34.0)
MCV: 79.7 fL (ref 78.0–100.0)
Platelets: 166 10*3/uL (ref 150–400)
RDW: 17.3 % — ABNORMAL HIGH (ref 11.5–15.5)

## 2012-12-17 LAB — BASIC METABOLIC PANEL
BUN: 25 mg/dL — ABNORMAL HIGH (ref 6–23)
Calcium: 8.7 mg/dL (ref 8.4–10.5)
Creatinine, Ser: 1.32 mg/dL (ref 0.50–1.35)
GFR calc Af Amer: 67 mL/min — ABNORMAL LOW (ref >90)

## 2012-12-17 NOTE — Discharge Summary (Signed)
Physician Discharge Summary  Patient ID: [name] [name] MRN: [id number] DOB/AGE: 58-Oct-1957 58 y.o.  Admit date: 12/16/2012 Discharge date: 12/17/2012  Admission Diagnoses: Prostate Cancer  Discharge Diagnoses: Prostate Cancer   Discharged Condition: good  Hospital Course:   1 - Prostate Cancer - Pt underwent robotic assisted radical prostatectomy on 12/16/2012, the day of admission, without acute complications. By POD 1, the day of discharge, he was tolerating regular diet, pain controlled on PO meds, JP output low, abmulatory, and felt to be adequate for discharge.   Consults: None  Significant Diagnostic Studies: labs: path - pending, Hgb >12  Treatments: surgery: robotic assisted radical prostatectomy on 12/16/2012  Discharge Exam: Blood pressure 125/70, pulse 62, temperature 98.4 F (36.9 C), temperature source Oral, resp. rate 16, height 5\' 11"  (1.803 m), weight 125.646 kg (277 lb), SpO2 100.00%. General appearance: alert, cooperative and appears stated age Head: Normocephalic, without obvious abnormality, atraumatic Eyes: conjunctivae/corneas clear. PERRL, EOM's intact. Fundi benign. Ears: normal TM's and external ear canals both ears Nose: Nares normal. Septum midline. Mucosa normal. No drainage or sinus tenderness. Throat: lips, mucosa, and tongue normal; teeth and gums normal Neck: no adenopathy, no carotid bruit, no JVD, supple, symmetrical, trachea midline and thyroid not enlarged, symmetric, no tenderness/mass/nodules Back: symmetric, no curvature. ROM normal. No CVA tenderness. Resp: clear to auscultation bilaterally Chest wall: no tenderness Cardio: regular rate and rhythm, S1, S2 normal, no murmur, click, rub or gallop GI: soft, non-tender; bowel sounds normal; no masses,  no organomegaly Male genitalia: normal, foley c/d/i with clear urine.  Extremities: extremities normal, atraumatic, no cyanosis or edema Pulses: 2+ and symmetric Skin: Skin color,  texture, turgor normal. No rashes or lesions Lymph nodes: Cervical, supraclavicular, and axillary nodes normal. Neurologic: Grossly normal Incision/Wound: Recent port sites and extraction sites c/d/i. JP removed and dry dressing applied.   Disposition: 01-Home or Self Care   Future Appointments Provider Department Dept Phone   12/29/2012 8:15 AM Dominic Pea, [facility] [phone]       Medication List         ARIPiprazole 10 MG tablet  Commonly known as:  ABILIFY  Take 10 mg by mouth at bedtime.     carvedilol 12.5 MG tablet  Commonly known as:  COREG  Take 18.75 mg by mouth 2 (two) times daily with a meal.     cloNIDine 0.2 MG tablet  Commonly known as:  CATAPRES  Take 0.2 mg by mouth 2 (two) times daily.     dicyclomine 20 MG tablet  Commonly known as:  BENTYL  Take 1 tablet (20 mg total) by mouth every 6 (six) hours as needed (for abdominal cramping).     DSS 100 MG Caps  Take 100 mg by mouth 2 (two) times daily.     furosemide 40 MG tablet  Commonly known as:  LASIX  Take 80 mg by mouth 2 (two) times daily.     ketoconazole 2 % cream  Commonly known as:  NIZORAL  Apply 1 application topically daily.     lisinopril 20 MG tablet  Commonly known as:  PRINIVIL,ZESTRIL  Take 20 mg by mouth 2 (two) times daily.     metolazone 2.5 MG tablet  Commonly known as:  ZAROXOLYN  Take 2.5 mg by mouth 2 (two) times daily.     oxyCODONE-acetaminophen 5-325 MG per tablet  Commonly known as:  PERCOCET/ROXICET  Take 2 tablets by mouth every 4 (four) hours as needed for  moderate pain.     pantoprazole 40 MG tablet  Commonly known as:  PROTONIX  Take 40 mg by mouth 2 (two) times daily.     potassium chloride 10 MEQ tablet  Commonly known as:  K-DUR  Take 10 mEq by mouth 2 (two) times daily.     rosuvastatin 20 MG tablet  Commonly known as:  CRESTOR  Take 20 mg by mouth daily.     traZODone 100 MG tablet  Commonly known as:  DESYREL   Take 100 mg by mouth at bedtime.           Follow-up Information   Follow up with Ardis Hughs, MD On 12/26/2012.   Specialty:  Urology   Contact information:   [address], [place] [address] [place] [address] [phone]       Signed: Alexis Frock 12/17/2012, 8:10 AM

## 2012-12-17 NOTE — Progress Notes (Signed)
Leg bag teaching to pt with reinforcement done, d/c home w/sister

## 2012-12-19 ENCOUNTER — Emergency Department (HOSPITAL_COMMUNITY): Payer: PRIVATE HEALTH INSURANCE

## 2012-12-19 ENCOUNTER — Encounter (HOSPITAL_COMMUNITY): Payer: Self-pay | Admitting: Emergency Medicine

## 2012-12-19 ENCOUNTER — Emergency Department (HOSPITAL_COMMUNITY)
Admission: EM | Admit: 2012-12-19 | Discharge: 2012-12-19 | Disposition: A | Discharge status: Home / Self Care | Payer: PRIVATE HEALTH INSURANCE | Attending: Emergency Medicine | Admitting: Emergency Medicine

## 2012-12-19 DIAGNOSIS — N182 Chronic kidney disease, stage 2 (mild): Secondary | ICD-10-CM | POA: Insufficient documentation

## 2012-12-19 DIAGNOSIS — R141 Gas pain: Secondary | ICD-10-CM | POA: Insufficient documentation

## 2012-12-19 DIAGNOSIS — Z8673 Personal history of transient ischemic attack (TIA), and cerebral infarction without residual deficits: Secondary | ICD-10-CM | POA: Insufficient documentation

## 2012-12-19 DIAGNOSIS — E785 Hyperlipidemia, unspecified: Secondary | ICD-10-CM | POA: Insufficient documentation

## 2012-12-19 DIAGNOSIS — F1021 Alcohol dependence, in remission: Secondary | ICD-10-CM | POA: Insufficient documentation

## 2012-12-19 DIAGNOSIS — Z8619 Personal history of other infectious and parasitic diseases: Secondary | ICD-10-CM | POA: Insufficient documentation

## 2012-12-19 DIAGNOSIS — K219 Gastro-esophageal reflux disease without esophagitis: Secondary | ICD-10-CM | POA: Insufficient documentation

## 2012-12-19 DIAGNOSIS — R109 Unspecified abdominal pain: Secondary | ICD-10-CM | POA: Insufficient documentation

## 2012-12-19 DIAGNOSIS — E781 Pure hyperglyceridemia: Secondary | ICD-10-CM | POA: Insufficient documentation

## 2012-12-19 DIAGNOSIS — Z9889 Other specified postprocedural states: Secondary | ICD-10-CM | POA: Insufficient documentation

## 2012-12-19 DIAGNOSIS — I5042 Chronic combined systolic (congestive) and diastolic (congestive) heart failure: Secondary | ICD-10-CM | POA: Insufficient documentation

## 2012-12-19 DIAGNOSIS — Z9079 Acquired absence of other genital organ(s): Secondary | ICD-10-CM | POA: Insufficient documentation

## 2012-12-19 DIAGNOSIS — Z8669 Personal history of other diseases of the nervous system and sense organs: Secondary | ICD-10-CM | POA: Insufficient documentation

## 2012-12-19 DIAGNOSIS — F259 Schizoaffective disorder, unspecified: Secondary | ICD-10-CM | POA: Insufficient documentation

## 2012-12-19 DIAGNOSIS — Z79899 Other long term (current) drug therapy: Secondary | ICD-10-CM | POA: Insufficient documentation

## 2012-12-19 DIAGNOSIS — Q211 Atrial septal defect: Secondary | ICD-10-CM | POA: Insufficient documentation

## 2012-12-19 DIAGNOSIS — Q2111 Secundum atrial septal defect: Secondary | ICD-10-CM | POA: Insufficient documentation

## 2012-12-19 DIAGNOSIS — R142 Eructation: Secondary | ICD-10-CM | POA: Insufficient documentation

## 2012-12-19 DIAGNOSIS — Z8546 Personal history of malignant neoplasm of prostate: Secondary | ICD-10-CM | POA: Insufficient documentation

## 2012-12-19 DIAGNOSIS — I129 Hypertensive chronic kidney disease with stage 1 through stage 4 chronic kidney disease, or unspecified chronic kidney disease: Secondary | ICD-10-CM | POA: Insufficient documentation

## 2012-12-19 DIAGNOSIS — Z8739 Personal history of other diseases of the musculoskeletal system and connective tissue: Secondary | ICD-10-CM | POA: Insufficient documentation

## 2012-12-19 DIAGNOSIS — Z862 Personal history of diseases of the blood and blood-forming organs and certain disorders involving the immune mechanism: Secondary | ICD-10-CM | POA: Insufficient documentation

## 2012-12-19 DIAGNOSIS — Z87891 Personal history of nicotine dependence: Secondary | ICD-10-CM | POA: Insufficient documentation

## 2012-12-19 LAB — CBC WITH DIFFERENTIAL/PLATELET
Basophils Absolute: 0 10*3/uL (ref 0.0–0.1)
Basophils Relative: 0 % (ref 0–1)
Eosinophils Absolute: 0.2 10*3/uL (ref 0.0–0.7)
Eosinophils Relative: 2 % (ref 0–5)
HCT: 42.3 % (ref 39.0–52.0)
MCH: 25.9 pg — ABNORMAL LOW (ref 26.0–34.0)
MCHC: 31.9 g/dL (ref 30.0–36.0)
MCV: 81.2 fL (ref 78.0–100.0)
Platelets: 142 10*3/uL — ABNORMAL LOW (ref 150–400)
RDW: 17.4 % — ABNORMAL HIGH (ref 11.5–15.5)
WBC: 7.7 10*3/uL (ref 4.0–10.5)

## 2012-12-19 LAB — BASIC METABOLIC PANEL
BUN: 27 mg/dL — ABNORMAL HIGH (ref 6–23)
CO2: 30 mEq/L (ref 19–32)
Calcium: 9.9 mg/dL (ref 8.4–10.5)
GFR calc Af Amer: 78 mL/min — ABNORMAL LOW (ref >90)
GFR calc non Af Amer: 67 mL/min — ABNORMAL LOW (ref >90)
Glucose, Bld: 120 mg/dL — ABNORMAL HIGH (ref 70–99)
Potassium: 3.6 mEq/L (ref 3.5–5.1)
Sodium: 137 mEq/L (ref 135–145)

## 2012-12-19 LAB — URINALYSIS, ROUTINE W REFLEX MICROSCOPIC
Ketones, ur: NEGATIVE mg/dL
Nitrite: NEGATIVE
Protein, ur: 30 mg/dL — AB
Urobilinogen, UA: 0.2 mg/dL (ref 0.0–1.0)

## 2012-12-19 MED ORDER — HYDROMORPHONE HCL PF 1 MG/ML IJ SOLN
1.0000 mg | Freq: Once | INTRAMUSCULAR | Status: AC
  Administered 2012-12-19: 1 mg via INTRAVENOUS
  Filled 2012-12-19: qty 1

## 2012-12-19 MED ORDER — IOHEXOL 300 MG/ML  SOLN
50.0000 mL | Freq: Once | INTRAMUSCULAR | Status: AC | PRN
  Administered 2012-12-19: 50 mL via ORAL

## 2012-12-19 MED ORDER — IOHEXOL 300 MG/ML  SOLN
100.0000 mL | Freq: Once | INTRAMUSCULAR | Status: AC | PRN
  Administered 2012-12-19: 100 mL via INTRAVENOUS

## 2012-12-19 MED ORDER — SODIUM CHLORIDE 0.9 % IV BOLUS (SEPSIS)
1000.0000 mL | Freq: Once | INTRAVENOUS | Status: AC
  Administered 2012-12-19: 1000 mL via INTRAVENOUS

## 2012-12-19 MED ORDER — OXYCODONE-ACETAMINOPHEN 5-325 MG PO TABS
2.0000 | ORAL_TABLET | Freq: Once | ORAL | Status: AC
  Administered 2012-12-19: 2 via ORAL
  Filled 2012-12-19: qty 2

## 2012-12-19 MED ORDER — ONDANSETRON HCL 4 MG/2ML IJ SOLN
4.0000 mg | INTRAMUSCULAR | Status: AC
  Administered 2012-12-19: 4 mg via INTRAVENOUS
  Filled 2012-12-19: qty 2

## 2012-12-19 NOTE — ED Notes (Signed)
Bed: WA08 Expected date:  Expected time:  Means of arrival:  Comments: EMS 44 M abd pain

## 2012-12-19 NOTE — ED Notes (Signed)
Patient had prostate surgery 2 days ago. He began having abdominal pain radiating down into his rectum. He has not a bowel movement since surgery. He was prescribed stool softeners but has not had the prescriptions filled.

## 2012-12-19 NOTE — ED Provider Notes (Signed)
Medical screening examination/treatment/procedure(s) were performed by non-physician practitioner and as supervising physician I was immediately available for consultation/collaboration.    Kathalene Frames, MD 12/19/12 [phone]

## 2012-12-19 NOTE — ED Provider Notes (Signed)
CSN: [id number]     Arrival date & time 12/19/12  0029 History   First MD Initiated Contact with Patient 12/19/12 0131     Chief Complaint  Patient presents with  . Abdominal Pain   (Consider location/radiation/quality/duration/timing/severity/associated sxs/prior Treatment) HPI Comments: Patient is a 57 y/o male with a hx of T1 C prostate cancer with Gleason 3+4 adenocarcinoma bilaterally, 2 days s/p laparoscopic prostatectomy by Dr. Louis Meckel, presents for abdominal pain with onset 8 hours ago which has been gradually worsening. Patient states that pain is radiating through to his rectum and without modifying factors. He tried [employer] today without relief. Patient states he passed a small amount of flatus today, but has yet to have a bowel movement. He has a catheter in place which has been draining normally with plan to come out on 12/29/12. Patient denies associated fever, CP, SOB, N/V, numbness/tingling, and weakness.  Patient is a 57 y.o. male presenting with abdominal pain. The history is provided by the patient. No language interpreter was used.  Abdominal Pain Associated symptoms: no chest pain, no fever, no nausea, no shortness of breath and no vomiting     Past Medical History  Diagnosis Date  . GERD (gastroesophageal reflux disease)   . Schizophrenia, schizo-affective   . Cataract   . Hyperlipidemia   . Wears glasses   . Wears dentures     upper  . Hypertension     uncontrolled with medication noncompliance  . Hepatitis C   . HIT (heparin-induced thrombocytopenia)   . Continuous chronic alcoholism   . H/O cocaine abuse     none in 6 months  . Stroke 01/2012    Small cerebellar infarcts right greater than left as well as questionable acute left external capsule and caudate nuclear punctate lacunar infarcts noted per MRI (01/2012) - presumed to be embolic likely source PFO with right to left shunt (noted per TEE 01/ 2014)  . Chronic combined systolic and diastolic CHF  (congestive heart failure) 02/29/2012    LV EF 40-45% per 2D echo (02/2012) with grade 1 diastolic dysfunction. 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 Degenerative lumbar spinal stenosis     s/p L2-3, L3-4, L4-5 laminectomy partial facetectomy, and bilateral foraminotomy  . History of pancreatitis 01/2011    Admission for acute pancreatitis presumed 2/2 ongoing alcohol abuse- and hypertriglyceridemia  . PFO (patent foramen ovale) 01/2012    with right to left shunt, noted per TEE in evaluation for source of embolic stroke in [id number]  . CKD (chronic kidney disease) stage 2, GFR 60-89 ml/min     BL SCr approximately 1-1.3  . 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 Hepatic steatosis     suspected 2/2 alcohol abuse  . Splenic cyst   . Colitis 05/2009    History of colitis of ascending colon noted on CT abd/pelvis (05/2009), with interval resolution with subsequent CT  . Hypertriglyceridemia   . Cancer     Prostate cancer-bx. 3 weeks ago  . History of ETOH abuse 12-13-12     no use in 6 months   Past Surgical History  Procedure Laterality Date  . L2-3, l3-4, l4-5 laminectomy, partial facetectomy    . Left achilles Right 2007  . Tonsillectomy    . Knee arthroscopy    . I&d extremity  03/20/2011    Procedure: IRRIGATION AND  DEBRIDEMENT EXTREMITY;  Surgeon: Kerin Salen, MD;  Location: [facility];  Service: Orthopedics;  Laterality: Left;  I&D LEFT ACHILLIES TENDON  . Eye surgery      right eye  . Resection distal clavical  09/17/2011    Procedure: RESECTION DISTAL CLAVICAL;  Surgeon: Nita Sells, MD;  Location: [facility];  Service: Orthopedics;  Laterality: Right;  right shoulder arthroscopy with sad and open distal clavicle excision   . Esophagogastroduodenoscopy N/A 06/25/2012    Procedure: ESOPHAGOGASTRODUODENOSCOPY (EGD);  Surgeon:  Milus Banister, MD;  Location: [facility];  Service: Endoscopy;  Laterality: N/A;   Family History  Problem Relation Age of Onset  . CAD Mother 83  . CAD Sister   . CAD Brother 56    died from MI at age 41yo  . Hypertension     History  Substance Use Topics  . Smoking status: Former Smoker -- 0.50 packs/day for 30 years    Types: Cigarettes    Quit date: 06/24/2001  . Smokeless tobacco: Not on file  . Alcohol Use: No     Comment: Quit x 1.5 months. 12-13-12" none in 6 months"(reminded not to use)    Review of Systems  Constitutional: Negative for fever.  Respiratory: Negative for shortness of breath.   Cardiovascular: Negative for chest pain.  Gastrointestinal: Positive for abdominal pain. Negative for nausea and vomiting.  All other systems reviewed and are negative.    Allergies  Heparin and Thorazine  Home Medications   Current Outpatient Rx  Name  Route  Sig  Dispense  Refill  . ARIPiprazole (ABILIFY) 10 MG tablet   Oral   Take 10 mg by mouth at bedtime.         . carvedilol (COREG) 12.5 MG tablet   Oral   Take 18.75 mg by mouth 2 (two) times daily with a meal.         . cloNIDine (CATAPRES) 0.2 MG tablet   Oral   Take 0.2 mg by mouth 2 (two) times daily.         Marland Kitchen docusate sodium 100 MG CAPS   Oral   Take 100 mg by mouth 2 (two) times daily.   60 capsule   0   . furosemide (LASIX) 40 MG tablet   Oral   Take 80 mg by mouth 2 (two) times daily.         Marland Kitchen ketoconazole (NIZORAL) 2 % cream   Topical   Apply 1 application topically daily.         Marland Kitchen lisinopril (PRINIVIL,ZESTRIL) 20 MG tablet   Oral   Take 20 mg by mouth 2 (two) times daily.         . metolazone (ZAROXOLYN) 2.5 MG tablet   Oral   Take 2.5 mg by mouth 2 (two) times daily.         Marland Kitchen oxyCODONE-acetaminophen (PERCOCET/ROXICET) 5-325 MG per tablet   Oral   Take 2 tablets by mouth every 4 (four) hours as needed for moderate pain.   50 tablet   0   .  Oxycodone-Acetaminophen ER (XARTEMIS XR) 7.5-325 MG TBCR   Oral   Take 1 tablet by mouth 2 (two) times daily. Back pain         . pantoprazole (PROTONIX) 40 MG tablet   Oral   Take 40 mg by mouth 2 (two) times daily.         . potassium chloride (K-DUR) 10 MEQ tablet  Oral   Take 10 mEq by mouth 2 (two) times daily.          . rosuvastatin (CRESTOR) 20 MG tablet   Oral   Take 20 mg by mouth daily.         . traZODone (DESYREL) 100 MG tablet   Oral   Take 100 mg by mouth at bedtime.          BP 126/72  Pulse 83  Temp(Src) 98.4 F (36.9 C) (Oral)  Resp 16  SpO2 95%  Physical Exam  Nursing note and vitals reviewed. Constitutional: He is oriented to person, place, and time. He appears well-developed and well-nourished. No distress.  HENT:  Head: Normocephalic and atraumatic.  Eyes: Conjunctivae and EOM are normal. No scleral icterus.  Neck: Normal range of motion.  Cardiovascular: Normal rate, regular rhythm and normal heart sounds.   Pulmonary/Chest: Effort normal. No respiratory distress. He has no wheezes. He has no rales.  Abdominal: Soft. He exhibits distension. There is tenderness (suprapubic TTP). There is guarding. There is no rebound.  +abdominal distention with mild voluntary guarding. No peritoneal signs or evidence of acute surgical abdomen. Well healing laparoscopic incision sites without erythema, induration, or purulent drainage.  Musculoskeletal: Normal range of motion.  Neurological: He is alert and oriented to person, place, and time.  Skin: Skin is warm and dry. No rash noted. He is not diaphoretic. No erythema. No pallor.  Psychiatric: He has a normal mood and affect. His behavior is normal.    ED Course  Procedures (including critical care time) Labs Review Labs Reviewed  BASIC METABOLIC PANEL - Abnormal; Notable for the following:    Glucose, Bld 120 (*)    BUN 27 (*)    GFR calc non Af Amer 67 (*)    GFR calc Af Amer 78 (*)    All  other components within normal limits  CBC WITH DIFFERENTIAL - Abnormal; Notable for the following:    MCH 25.9 (*)    RDW 17.4 (*)    Platelets 142 (*)    Monocytes Relative 14 (*)    Monocytes Absolute 1.1 (*)    All other components within normal limits  URINALYSIS, ROUTINE W REFLEX MICROSCOPIC - Abnormal; Notable for the following:    APPearance CLOUDY (*)    Hgb urine dipstick LARGE (*)    Protein, ur 30 (*)    Leukocytes, UA MODERATE (*)    All other components within normal limits  LIPASE, BLOOD  URINE MICROSCOPIC-ADD ON  CG4 I-STAT (LACTIC ACID)   Imaging Review Ct Abdomen Pelvis W Contrast  12/19/2012   CLINICAL DATA:  Abdominal pain radiating to the rectum. Two days post prostate surgery. No bowel movement since then. White cell count 7.7. White blood cells in urine. Prostate cancer diagnosed 3 weeks ago.  EXAM: CT ABDOMEN AND PELVIS WITH CONTRAST  TECHNIQUE: Multidetector CT imaging of the abdomen and pelvis was performed using the standard protocol following bolus administration of intravenous contrast.  CONTRAST:  158mL OMNIPAQUE IOHEXOL 300 MG/ML  SOLN  COMPARISON:  07/15/2012  FINDINGS: Mild dependent changes in the lung bases.  Multiple low-attenuation lesions in the spleen, some with calcification. These appear stable since previous study and likely represent changes due to old infection. The liver, gallbladder, pancreas, adrenal glands, abdominal aorta, inferior vena cava, and retroperitoneal lymph nodes are unremarkable. Small bilateral renal cysts. No hydronephrosis. The stomach, small bowel, and colon are not abnormally distended. No free intra-abdominal air. No  free fluid.  Pelvis: There is a midline infiltration in the anterior abdominal wall with small subcutaneous gas collections. This is likely postoperative. A Foley catheter decompresses the bladder. There is a small amount of gas in the bladder likely due to the catheterization. The prostate gland appears surgically  absent. No evidence of any developing abscess in the region of the surgery. The appendix is normal. The rectosigmoid colon is normal. Degenerative and postoperative changes in the spine with normal alignment.  IMPRESSION: Stable appearance of multiple splenic lesions. Postoperative changes in the pelvis consistent with history of recent prostatectomy. No evidence of abscess or acute complication.   Electronically Signed   By: Lucienne Capers M.D.   On: 12/19/2012 05:58   Dg Abd 2 Views  12/19/2012   CLINICAL DATA:  Prostate surgery 11/2019. Lower abdominal pain for 24 hr.  EXAM: ABDOMEN - 2 VIEW  COMPARISON:  CT abdomen and pelvis 06/2012  FINDINGS: There is prominent gas and stool throughout the colon without significant distention. No small bowel distention. Changes likely represent ileus. No free air. No abnormal air-fluid levels. No radiopaque stones identified. Postoperative and degenerative changes in the spine. Catheter in the pelvis consistent with Foley catheter. Degenerative changes in the hips.  IMPRESSION: Nonobstructive bowel gas pattern with gas and stool in the colon likely representing ileus.   Electronically Signed   By: Lucienne Capers M.D.   On: 12/19/2012 02:12    EKG Interpretation   None       MDM   1. Abdominal pain    Patient is a 57 year old male who is 2 days s/p laparoscopic prostatectomy. He presents for suprapubic pain radiating through to his rectum. Patient endorses passing a small amount of flatus since the procedure, though he has yet to have a bowel movement. Patient well and nontoxic appearing, hemodynamically stable, and afebrile on arrival. Patient with mild abdominal distention and suprapubic tenderness to palpation without peritoneal signs or involuntary guarding. Laparoscopic incision sites appear to be well healing without purulent drainage, induration, or erythema. Labs checked today for further evaluation of patient's symptoms. Patient without  leukocytosis and kidney function stable. CT abdomen and pelvis ordered which shows no evidence of acute intra-abdominal abnormality; no evidence of acute complication or abscess. I have reviewed these findings in their entirely with the patient who verbalizes understanding. Patient's symptoms have been well controlled with IV Dilaudid, IV fluids, and Zofran. Will consult with [facility] as believe patient stable for discharge with outpatient followup.  6:30 - Discussed patient with Dr. Tresa Moore who agrees patient stable for f/u in office as outpatient. Patient already with Rx for Percocet for pain control as needed. He is stable for discharge. Return precautions discussed with the patient who verbalizes comfort and understanding with this discharge plan.  Antonietta Breach, PA-C 12/19/12 [phone]

## 2012-12-20 ENCOUNTER — Other Ambulatory Visit (HOSPITAL_COMMUNITY): Payer: Self-pay | Admitting: *Deleted

## 2012-12-20 MED ORDER — FUROSEMIDE 40 MG PO TABS: 80.0000 mg | ORAL_TABLET | Freq: Two times a day (BID) | ORAL | Status: DC

## 2012-12-29 ENCOUNTER — Encounter: Payer: PRIVATE HEALTH INSURANCE | Admitting: Internal Medicine

## 2013-01-06 ENCOUNTER — Ambulatory Visit (INDEPENDENT_AMBULATORY_CARE_PROVIDER_SITE_OTHER): Payer: PRIVATE HEALTH INSURANCE | Admitting: Internal Medicine

## 2013-01-06 ENCOUNTER — Encounter: Payer: Self-pay | Admitting: Internal Medicine

## 2013-01-06 VITALS — BP 163/94 | HR 61 | Temp 97.3°F | Ht 71.0 in | Wt 281.4 lb

## 2013-01-06 DIAGNOSIS — E119 Type 2 diabetes mellitus without complications: Secondary | ICD-10-CM

## 2013-01-06 DIAGNOSIS — I5022 Chronic systolic (congestive) heart failure: Secondary | ICD-10-CM

## 2013-01-06 DIAGNOSIS — E785 Hyperlipidemia, unspecified: Secondary | ICD-10-CM

## 2013-01-06 DIAGNOSIS — I10 Essential (primary) hypertension: Secondary | ICD-10-CM

## 2013-01-06 DIAGNOSIS — R7309 Other abnormal glucose: Secondary | ICD-10-CM

## 2013-01-06 DIAGNOSIS — R739 Hyperglycemia, unspecified: Secondary | ICD-10-CM

## 2013-01-06 DIAGNOSIS — R32 Unspecified urinary incontinence: Secondary | ICD-10-CM

## 2013-01-06 LAB — COMPLETE METABOLIC PANEL WITH GFR
AST: 14 U/L (ref 0–37)
Albumin: 4 g/dL (ref 3.5–5.2)
BUN: 11 mg/dL (ref 6–23)
Calcium: 9.3 mg/dL (ref 8.4–10.5)
Chloride: 107 mEq/L (ref 96–112)
Glucose, Bld: 88 mg/dL (ref 70–99)
Potassium: 4.1 mEq/L (ref 3.5–5.3)
Total Bilirubin: 0.4 mg/dL (ref 0.3–1.2)

## 2013-01-06 LAB — CBC WITH DIFFERENTIAL/PLATELET
Basophils Absolute: 0 10*3/uL (ref 0.0–0.1)
Basophils Relative: 0 % (ref 0–1)
Eosinophils Absolute: 0.2 10*3/uL (ref 0.0–0.7)
Eosinophils Relative: 3 % (ref 0–5)
HCT: 35.8 % — ABNORMAL LOW (ref 39.0–52.0)
Lymphocytes Relative: 30 % (ref 12–46)
MCV: 76.8 fL — ABNORMAL LOW (ref 78.0–100.0)
Monocytes Relative: 8 % (ref 3–12)
Platelets: 262 10*3/uL (ref 150–400)
RDW: 18.3 % — ABNORMAL HIGH (ref 11.5–15.5)
WBC: 5.6 10*3/uL (ref 4.0–10.5)

## 2013-01-06 LAB — HEMOGLOBIN A1C: Hgb A1c MFr Bld: 7.7 % — ABNORMAL HIGH (ref <5.7)

## 2013-01-06 MED ORDER — POTASSIUM CHLORIDE ER 10 MEQ PO TBCR: 10.0000 meq | EXTENDED_RELEASE_TABLET | Freq: Two times a day (BID) | ORAL | Status: DC

## 2013-01-06 NOTE — Progress Notes (Signed)
Subjective:    Patient ID: [name] [name], male    DOB: 24-May-1955, 57 y.o.   MRN: [id number]  HPI Presents for follow up today. He is s/p robotic assisted radical prostatectomy on 12/16/2012.  He is followed by Dr. Louis Meckel.  He was able to be established with Prince Solian, PA-C at [facility] and last seen earlier this week. We will request records. He states after his prostate surgery he is having urinary incontinence and frequent "leaking". He is interested in adult diapers as a result.   Review of Systems  Constitutional: Negative for fever, chills, activity change, appetite change and fatigue.  Respiratory: Negative for chest tightness and shortness of breath.   Cardiovascular: Negative for chest pain and leg swelling.  Gastrointestinal: Negative for constipation, blood in stool and abdominal distention.  Endocrine: Negative for polydipsia and polyphagia.  Genitourinary: Positive for frequency. Negative for hematuria, decreased urine volume and penile pain.  Neurological: Negative for headaches.  Psychiatric/Behavioral: Negative for agitation.       Objective:   Physical Exam  Constitutional: He is oriented to person, place, and time. He appears well-developed and well-nourished. No distress.  HENT:  Head: Normocephalic and atraumatic.  Mouth/Throat: No oropharyngeal exudate.  Eyes: Conjunctivae and EOM are normal. Pupils are equal, round, and reactive to light.  Neck: Normal range of motion. Neck supple. No JVD present. No thyromegaly present.  Cardiovascular: Normal rate, regular rhythm, normal heart sounds and intact distal pulses.  Exam reveals no gallop and no friction rub.   No murmur heard. Pulmonary/Chest: Effort normal. No respiratory distress. He has no wheezes.  Abdominal: Soft. Bowel sounds are normal. He exhibits no distension, no fluid wave, no abdominal bruit and no mass. There is no hepatosplenomegaly. There is no tenderness. There is no  guarding. No hernia.  Musculoskeletal: Normal range of motion. He exhibits no edema.  Neurological: He is alert and oriented to person, place, and time.  Skin: Skin is warm. He is not diaphoretic.  Psychiatric: He has a normal mood and affect. His behavior is normal. Judgment and thought content normal.   Filed Vitals:   01/06/13 0920  BP: 163/94  Pulse: 61  Temp: 97.3 F (36.3 C)       Assessment & Plan:  39 yr. Old AAM with hx HTN, schizophrenia, hx cerebellar/left brain stem CVA, hx substance abuse including cocaine and tobacco, hx rhabdomyolysis, chronic systolic heart failure (EF 35-40%) followed at HF clinic, s/p radical prostatectomy due to prostate CA, presents for follow up.  1) Chronic systolic HF: Euvolemic today. Follows with HF clinic Dr. Haroldine Laws.  2) HTN: He states he did not take his medications as he should today, reminded him of importance and risks of not adhering to medication regimen.  3) Schizophrenia: Stable on current regimen.  4) HL: 10 year risk of CV event is 12.4%. Lipitor 40 mg daily.  5) Substance abuse: no longer using cocaine or TOB, educated.  6) Prostate CA: s/p radical prostatectomy. He is having urinary incontinence. Will order adult diapers. He will be following up with urology. 7) Health maintenance: Pneumovax UTD. DTap UTD. Colonoscopy performed on 8/201 with findings of one tubular adenoma without high grade dysplasia.   States had flu vaccine at [place] this year. 8) Arthritis of Left shoulder: Cannot give NSAIDs due to hx gastric ulcer and GI bleeding. He follows with pain management for narcotic therapy due to contraindication to NSAIDs.  9) Hyperglycemia: Obtain HgbA1C today, may have  underlying DM. 10) return in 1 month.   Dominic Pea, DO, [facility] 01/06/2013, 9:48 AM

## 2013-01-06 NOTE — Patient Instructions (Addendum)
-  Blood work today. -Sent request for incontinence supplies for you (adult diapers). -Return in one month.

## 2013-01-12 ENCOUNTER — Telehealth: Payer: Self-pay | Admitting: Internal Medicine

## 2013-01-12 MED ORDER — METFORMIN HCL 500 MG PO TABS: 500.0000 mg | ORAL_TABLET | Freq: Two times a day (BID) | ORAL | Status: DC

## 2013-01-12 NOTE — Telephone Encounter (Signed)
Rec'd phone call from patient stating that his incontinence supplies have not been delivered.  [place] this morning to follow up with DME Supply order.  Spoke with Rep Vaughan Basta at [facility].  They have been trying to reach this patient with no success to get his size so that the order can be completed.  [facility] will try to reach him again.  If patient has any questions in regards to his supplies. He is asked to call back to [phone] and press the prompts for supplies.

## 2013-01-12 NOTE — Addendum Note (Signed)
Addended by: Dominic Pea on: 01/12/2013 01:51 PM   Modules accepted: Orders

## 2013-02-06 ENCOUNTER — Encounter: Payer: Self-pay | Admitting: Dietician

## 2013-02-06 ENCOUNTER — Ambulatory Visit (INDEPENDENT_AMBULATORY_CARE_PROVIDER_SITE_OTHER): Payer: PRIVATE HEALTH INSURANCE | Admitting: Internal Medicine

## 2013-02-06 ENCOUNTER — Encounter: Payer: Self-pay | Admitting: Internal Medicine

## 2013-02-06 ENCOUNTER — Ambulatory Visit (INDEPENDENT_AMBULATORY_CARE_PROVIDER_SITE_OTHER): Payer: PRIVATE HEALTH INSURANCE | Admitting: Dietician

## 2013-02-06 VITALS — BP 125/75 | HR 64 | Temp 99.4°F | Ht 71.0 in | Wt 283.3 lb

## 2013-02-06 DIAGNOSIS — E119 Type 2 diabetes mellitus without complications: Secondary | ICD-10-CM

## 2013-02-06 DIAGNOSIS — I10 Essential (primary) hypertension: Secondary | ICD-10-CM

## 2013-02-06 DIAGNOSIS — M67439 Ganglion, unspecified wrist: Secondary | ICD-10-CM | POA: Insufficient documentation

## 2013-02-06 DIAGNOSIS — M25539 Pain in unspecified wrist: Secondary | ICD-10-CM | POA: Insufficient documentation

## 2013-02-06 DIAGNOSIS — M67431 Ganglion, right wrist: Secondary | ICD-10-CM

## 2013-02-06 DIAGNOSIS — M6740 Ganglion, unspecified site: Secondary | ICD-10-CM

## 2013-02-06 LAB — GLUCOSE, CAPILLARY: GLUCOSE-CAPILLARY: 128 mg/dL — AB (ref 70–99)

## 2013-02-06 NOTE — Assessment & Plan Note (Signed)
CBG 128 today.  He reports compliance with Metformin 500mg  BID and has not experienced any GI ADRs.  Will continue current therapy.  Repeat HgbA1c in 2 months.

## 2013-02-06 NOTE — Assessment & Plan Note (Signed)
Small swelling of medial right wrist (radial side).  Soft, mobile and non-tender to palpation.  Advised him that it will likely resolve on its own and since it is not bothering him will observe for now.  He is advised to return if it changes.

## 2013-02-06 NOTE — Progress Notes (Signed)
   Subjective:    Patient ID: [name] [name], male    DOB: 06/19/1955, 58 y.o.   MRN: [id number]  HPI Comments: [name] [name] is a 58 year old male with PMH of HTN, systolic and diastolic HF, DM type 2, HCV, hx of CVA,  CKD2, schizophrenia and prostate CA (s/p robotic assisted radical prostatectomy 11/2012).  He presents for follow-up of his newly diagnosed DM.  He reports compliance with  Metformin 500mg  BID.  His CBG in office is 128.    He reports complaint of right wrist pain x 1 month and has noticed a swelling on the right wrist that started 2 weeks ago.  He denies previous injury or trauma to the wrist.  The pain is only noticeable when he uses the hand to help push himself up from a seated position or when he grabs items.  He has full range of motion and radial pulse is intact.  The swelling on his wrist is not painful.  He has never had these symptoms before.         Review of Systems  Constitutional: Negative for fever.  Respiratory: Negative for shortness of breath.   Cardiovascular: Negative for chest pain.  Gastrointestinal: Negative for nausea, vomiting and diarrhea.  Endocrine: Negative for polydipsia and polyuria.  Neurological: Negative for headaches.       Objective:   Physical Exam  Constitutional: No distress.  Cardiovascular: Normal rate, regular rhythm and normal heart sounds.   Pulmonary/Chest: Effort normal. No respiratory distress. He has no wheezes. He has no rales.  Abdominal: Soft. Bowel sounds are normal. He exhibits no distension. There is no tenderness.  Musculoskeletal:  Right dorsal hand TTP between first and second metatarsals; small, round, mobile, non-tender lesion palpated radial side of inner wrist.   Skin: Skin is warm. He is not diaphoretic.  Psychiatric: He has a normal mood and affect. His behavior is normal.          Assessment & Plan:  Please see problem based assessment and plan.

## 2013-02-06 NOTE — Assessment & Plan Note (Signed)
BP 125/75 today.  He reports compliance with medications and has brought his pill bottles with him.  Most of his BP medications are prescribed by Dr. Haroldine Laws.  His Coreg bottle has been filled for 12.5mg  tablets take one pill BID recently.  However, the current med list in EPIC list 18.75mg  (one and a half tablets) BID.  Call to cardiologist's office confirms that the 18.75mg  BID is the correct dose.  I called to pharmacy and it appears that both rx's were on file and the lower dose one had been filled recently.  They will delete old rx.  Patient was instructed to take 1 and a half pills daily.  He understands he will need to buy a pill cutter.

## 2013-02-06 NOTE — Progress Notes (Signed)
Diabetes Self-Management Education  Visit Number: First/Initial  02/06/2013 Mr. [name] [name], identified by name and date of birth, is a 58 y.o. male with Type 2 diabetes Diabetes Type: Type 2.   ASSESSMENT  Patient Concerns:  Nutrition/Meal planning;Healthy Lifestyle;Glycemic Control  The vitals were taken in his office visit with the physician. .  Lab Results: LDL Cholesterol  Date Value Range Status  02/20/2012 121* 0 - 99 mg/dL Final                Hemoglobin A1C  Date Value Range Status  01/06/2013 7.7* <5.7 % Final                                                                                      Family History  Problem Relation Age of Onset  . CAD Mother 69  . CAD Sister   . CAD Brother 44    died from MI at age 25yo  . Hypertension     History  Substance Use Topics  . Smoking status: Former Smoker -- 0.50 packs/day for 30 years    Types: Cigarettes    Quit date: 06/24/2001  . Smokeless tobacco: Not on file  . Alcohol Use: No     Comment: Quit about several months ago.    Support Systems:  His sister and aide help him with cooking and food shopping Special Needs:  Simplified materials;Verbal instruction believe he may need individual attention due to his mental illness Prior DM Education:  no Daily Foot Exams:  no Patient Belief / Attitude about Diabetes:  Diabetes can be controlledIt can be controlled  Assessment comments: patient agreeable to making healthy changes. Believe that in an environment that supports this he will do well. He has already started making some changes on his own.    Diet Recall: 530 am- large glass of juice 7-8 am, large glass of juice, 2-3 eggs, 2 slices toast, stick margarine, [place] bacon or pancakes x 2   Individualized Plan for Diabetes Self-Management Training:  Patient individualized diabetes plan discussed today with patient and includes: what is Diabetes, Nutrition, medications, monitoring, physical activity, how  to handle highs and lows, Special care for my body when I have diabetes, Dealing daily with diabetes  Education Topics Reviewed with Patient Today:  Topic Points Discussed  Disease State Definition of diabetes, type 1 and 2, and the diagnosis of diabetes  Nutrition Management Role of diet in the treatment of diabetes and the relationship between the three main macronutrients and blood glucose level  Physical Activity and Exercise    Medications    Monitoring    Acute Complications    Chronic Complications Assessed and discussed foot care and prevention of foot problems  Psychosocial Adjustment    Goal Setting    Preconception Care (if applicable)      PATIENTS GOALS   Learning Objective(s):     Goal The patient agrees to:  Nutrition Follow meal plan discussed  Physical Activity    Medications    Monitoring    Problem Solving    Reducing Risk    Health Coping     Patient Self-Evaluation of Goals (Subsequent  Visits)  Goal The patient rates self as meeting goals (% of time)  Nutrition    Physical Activity    Medications    Monitoring    Problem Solving    Reducing Risk    Health Coping       PERSONALIZED PLAN / SUPPORT  Self-Management Support:  [place] office;Family;CDE visits  ______________________________________________________________________  Outcomes  Expected Outcomes:  Demonstrated interest in learning. Expect positive outcomes Self-Care Barriers:  Lack of transportation;Lack of material resources Education material provided: yes- living with diabetes book If problems or questions, patient to contact team via:  Phone Time in: 1500     Time out: [place] Future DSME appointment: - 4-6 wks   [name], [name] [name] 02/06/2013 4:01 PM

## 2013-02-06 NOTE — Patient Instructions (Addendum)
1. Please take Coreg 1 and a half pills (18.75mg ) twice per day.  You will need to use a pill cutter to cut the pill in half.    2.  Try and rest your right wrist and avoid activities that cause it to hurt.  Applying ice (wrapped in a towel) or heat pack may also help.   3. Please take all medications as prescribed.    4. If you have worsening of your symptoms or new symptoms arise, please call the clinic [phone] or go to the ER immediately if symptoms are severe.

## 2013-02-06 NOTE — Assessment & Plan Note (Addendum)
Right pain on the dorsal right hand between first and second metatarsals, possibly involving the 1st dorsal interosseous muscle.  Pain is present with use his hand.  No limits in ROM.  No previous trauma.  No pain or swelling in other areas or joints.  He likely has some muscle strain.  He cannot take NSAIDS due to hx of GI bleed.  Advised him to rest the hand and try ice or heat.  He is already on Opana and Percocet prescribed by pain management for shoulder arthritis.

## 2013-02-06 NOTE — Patient Instructions (Addendum)
Breakfast:  1- cup of Raisin Bran with 1 % milk is a healthy breakfast  Lunch & Dinner:  Eat twice as many vegetables( greens, salads, stewed okra and tomatoes, green beans) as you eat starchy foods ( corn, bread, potatoes,                             macaroni, rice)                HEALTHY CHOICE TV Dinners are always a healthy choice. Many other brands are too starchy or too salty.   Beverages:   Limit juice to 100% orange juice and 1 6 oz cup each day.  2 cups of 1% milk/day  Water  Unsweetened tea with artificial sweetener  Clear diet soda like diet ginger ale or diet sprite or diet 7 UP   Bedtime Snack:  Eat angel food cake, vanilla wafers, fruits, yogurt ( frozen grapes or frozen bananas)   Please make a follow up appointment for Diabetes Training 3-4 weeks

## 2013-02-07 NOTE — Progress Notes (Signed)
Case discussed with Dr. Wilson at the time of the visit.  We reviewed the resident's history and exam and pertinent patient test results.  I agree with the assessment, diagnosis, and plan of care documented in the resident's note. 

## 2013-02-11 ENCOUNTER — Telehealth: Payer: Self-pay | Admitting: Adult Health

## 2013-02-12 ENCOUNTER — Emergency Department (HOSPITAL_COMMUNITY): Payer: PRIVATE HEALTH INSURANCE

## 2013-02-12 ENCOUNTER — Encounter (HOSPITAL_COMMUNITY): Payer: Self-pay | Admitting: Emergency Medicine

## 2013-02-12 ENCOUNTER — Emergency Department (HOSPITAL_COMMUNITY)
Admission: EM | Admit: 2013-02-12 | Discharge: 2013-02-12 | Disposition: A | Discharge status: Home / Self Care | Payer: PRIVATE HEALTH INSURANCE | Attending: Emergency Medicine | Admitting: Emergency Medicine

## 2013-02-12 DIAGNOSIS — Z8774 Personal history of (corrected) congenital malformations of heart and circulatory system: Secondary | ICD-10-CM | POA: Insufficient documentation

## 2013-02-12 DIAGNOSIS — Z79899 Other long term (current) drug therapy: Secondary | ICD-10-CM | POA: Insufficient documentation

## 2013-02-12 DIAGNOSIS — R0609 Other forms of dyspnea: Secondary | ICD-10-CM | POA: Insufficient documentation

## 2013-02-12 DIAGNOSIS — Z8673 Personal history of transient ischemic attack (TIA), and cerebral infarction without residual deficits: Secondary | ICD-10-CM | POA: Insufficient documentation

## 2013-02-12 DIAGNOSIS — Z8546 Personal history of malignant neoplasm of prostate: Secondary | ICD-10-CM | POA: Insufficient documentation

## 2013-02-12 DIAGNOSIS — I129 Hypertensive chronic kidney disease with stage 1 through stage 4 chronic kidney disease, or unspecified chronic kidney disease: Secondary | ICD-10-CM | POA: Insufficient documentation

## 2013-02-12 DIAGNOSIS — R600 Localized edema: Secondary | ICD-10-CM

## 2013-02-12 DIAGNOSIS — R609 Edema, unspecified: Secondary | ICD-10-CM | POA: Insufficient documentation

## 2013-02-12 DIAGNOSIS — E781 Pure hyperglyceridemia: Secondary | ICD-10-CM | POA: Insufficient documentation

## 2013-02-12 DIAGNOSIS — Z87891 Personal history of nicotine dependence: Secondary | ICD-10-CM | POA: Insufficient documentation

## 2013-02-12 DIAGNOSIS — K219 Gastro-esophageal reflux disease without esophagitis: Secondary | ICD-10-CM | POA: Insufficient documentation

## 2013-02-12 DIAGNOSIS — R0602 Shortness of breath: Secondary | ICD-10-CM

## 2013-02-12 DIAGNOSIS — Z789 Other specified health status: Secondary | ICD-10-CM | POA: Insufficient documentation

## 2013-02-12 DIAGNOSIS — I5042 Chronic combined systolic (congestive) and diastolic (congestive) heart failure: Secondary | ICD-10-CM | POA: Insufficient documentation

## 2013-02-12 DIAGNOSIS — Z8619 Personal history of other infectious and parasitic diseases: Secondary | ICD-10-CM | POA: Insufficient documentation

## 2013-02-12 DIAGNOSIS — F259 Schizoaffective disorder, unspecified: Secondary | ICD-10-CM | POA: Insufficient documentation

## 2013-02-12 DIAGNOSIS — Z8739 Personal history of other diseases of the musculoskeletal system and connective tissue: Secondary | ICD-10-CM | POA: Insufficient documentation

## 2013-02-12 DIAGNOSIS — N182 Chronic kidney disease, stage 2 (mild): Secondary | ICD-10-CM | POA: Insufficient documentation

## 2013-02-12 DIAGNOSIS — R0989 Other specified symptoms and signs involving the circulatory and respiratory systems: Secondary | ICD-10-CM | POA: Insufficient documentation

## 2013-02-12 LAB — CBC WITH DIFFERENTIAL/PLATELET
Basophils Absolute: 0 10*3/uL (ref 0.0–0.1)
Basophils Relative: 0 % (ref 0–1)
EOS PCT: 3 % (ref 0–5)
Eosinophils Absolute: 0.2 10*3/uL (ref 0.0–0.7)
HEMATOCRIT: 43.1 % (ref 39.0–52.0)
HEMOGLOBIN: 14.1 g/dL (ref 13.0–17.0)
LYMPHS ABS: 1.7 10*3/uL (ref 0.7–4.0)
LYMPHS PCT: 29 % (ref 12–46)
MCH: 27.3 pg (ref 26.0–34.0)
MCHC: 32.7 g/dL (ref 30.0–36.0)
MCV: 83.4 fL (ref 78.0–100.0)
Monocytes Absolute: 0.8 10*3/uL (ref 0.1–1.0)
Monocytes Relative: 14 % — ABNORMAL HIGH (ref 3–12)
Neutro Abs: 3.2 10*3/uL (ref 1.7–7.7)
Neutrophils Relative %: 54 % (ref 43–77)
PLATELETS: 183 10*3/uL (ref 150–400)
RBC: 5.17 MIL/uL (ref 4.22–5.81)
RDW: 16.4 % — ABNORMAL HIGH (ref 11.5–15.5)
WBC: 6 10*3/uL (ref 4.0–10.5)

## 2013-02-12 LAB — URINALYSIS, ROUTINE W REFLEX MICROSCOPIC
BILIRUBIN URINE: NEGATIVE
Glucose, UA: NEGATIVE mg/dL
KETONES UR: NEGATIVE mg/dL
Leukocytes, UA: NEGATIVE
NITRITE: NEGATIVE
Protein, ur: 100 mg/dL — AB
SPECIFIC GRAVITY, URINE: 1.009 (ref 1.005–1.030)
UROBILINOGEN UA: 1 mg/dL (ref 0.0–1.0)
pH: 5.5 (ref 5.0–8.0)

## 2013-02-12 LAB — COMPREHENSIVE METABOLIC PANEL
ALK PHOS: 109 U/L (ref 39–117)
ALT: 21 U/L (ref 0–53)
AST: 32 U/L (ref 0–37)
Albumin: 4 g/dL (ref 3.5–5.2)
BUN: 14 mg/dL (ref 6–23)
CALCIUM: 10.1 mg/dL (ref 8.4–10.5)
CO2: 28 mEq/L (ref 19–32)
Chloride: 99 mEq/L (ref 96–112)
Creatinine, Ser: 0.98 mg/dL (ref 0.50–1.35)
GFR calc non Af Amer: 89 mL/min — ABNORMAL LOW (ref >90)
GLUCOSE: 131 mg/dL — AB (ref 70–99)
Potassium: 4.3 mEq/L (ref 3.7–5.3)
SODIUM: 141 meq/L (ref 137–147)
Total Bilirubin: 0.3 mg/dL (ref 0.3–1.2)
Total Protein: 7.9 g/dL (ref 6.0–8.3)

## 2013-02-12 LAB — URINE MICROSCOPIC-ADD ON

## 2013-02-12 LAB — PROTIME-INR
INR: 1.07 (ref 0.00–1.49)
Prothrombin Time: 13.7 seconds (ref 11.6–15.2)

## 2013-02-12 LAB — PRO B NATRIURETIC PEPTIDE: Pro B Natriuretic peptide (BNP): 89.6 pg/mL (ref 0–125)

## 2013-02-12 LAB — D-DIMER, QUANTITATIVE: D-Dimer, Quant: 0.51 ug/mL-FEU — ABNORMAL HIGH (ref 0.00–0.48)

## 2013-02-12 MED ORDER — SODIUM CHLORIDE 0.9 % IV BOLUS (SEPSIS): 1000.0000 mL | Freq: Once | INTRAVENOUS | Status: DC

## 2013-02-12 NOTE — Progress Notes (Signed)
VASCULAR LAB PRELIMINARY  PRELIMINARY  PRELIMINARY  PRELIMINARY  Bilateral lower extremity venous Dopplers completed.    Preliminary report:  There is no obvious evidence DVT or SVT noted in the bilateral lower extremities.    Meliya Mcconahy, RVT 02/12/2013, 9:50 AM

## 2013-02-12 NOTE — ED Notes (Signed)
Patient presents to ED for complaints of swelling in feet more than normal and some shortness of breath.

## 2013-02-12 NOTE — ED Provider Notes (Signed)
CSN: [id number]     Arrival date & time 02/12/13  0756 History   First MD Initiated Contact with Patient 02/12/13 0800     Chief Complaint  Patient presents with  . Edema   (Consider location/radiation/quality/duration/timing/severity/associated sxs/prior Treatment) HPI Patient presents with concern of pain and edema in both lower extremities with dyspnea. Symptoms began in the past 2 days.  Since onset and has been progressive.  It is focally about the lower extremities distally, worse on the right.  Pain is sore, severe. Pain is minimally relieved with narcotics. Patient's dyspnea has been concurrently worsening. Patient denies focal chest pain, lightheadedness, syncope, vomiting, diarrhea, fever, chills. Patient has a notable history of prior stroke, prostate cancer, hepatitis C.  Past Medical History  Diagnosis Date  . GERD (gastroesophageal reflux disease)   . Schizophrenia, schizo-affective   . Cataract   . Hyperlipidemia   . Wears glasses   . Wears dentures     upper  . Hypertension     uncontrolled with medication noncompliance  . Hepatitis C   . HIT (heparin-induced thrombocytopenia)   . Continuous chronic alcoholism   . H/O cocaine abuse     none in 6 months  . Stroke 01/2012    Small cerebellar infarcts right greater than left as well as questionable acute left external capsule and caudate nuclear punctate lacunar infarcts noted per MRI (01/2012) - presumed to be embolic likely source PFO with right to left shunt (noted per TEE 01/ 2014)  . Chronic combined systolic and diastolic CHF (congestive heart failure) 02/29/2012    LV EF 40-45% per 2D echo (02/2012) with grade 1 diastolic dysfunction. 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 Degenerative lumbar spinal stenosis     s/p L2-3, L3-4, L4-5 laminectomy partial facetectomy, and bilateral foraminotomy  . History of pancreatitis 01/2011    Admission for  acute pancreatitis presumed 2/2 ongoing alcohol abuse- and hypertriglyceridemia  . PFO (patent foramen ovale) 01/2012    with right to left shunt, noted per TEE in evaluation for source of embolic stroke in [id number]  . CKD (chronic kidney disease) stage 2, GFR 60-89 ml/min     BL SCr approximately 1-1.3  . 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 Hepatic steatosis     suspected 2/2 alcohol abuse  . Splenic cyst   . Colitis 05/2009    History of colitis of ascending colon noted on CT abd/pelvis (05/2009), with interval resolution with subsequent CT  . Hypertriglyceridemia   . Cancer     Prostate cancer-bx. 3 weeks ago  . History of ETOH abuse 12-13-12     no use in 6 months   Past Surgical History  Procedure Laterality Date  . L2-3, l3-4, l4-5 laminectomy, partial facetectomy    . Left achilles Right 2007  . Tonsillectomy    . Knee arthroscopy    . I&d extremity  03/20/2011    Procedure: IRRIGATION AND DEBRIDEMENT EXTREMITY;  Surgeon: Kerin Salen, MD;  Location: [facility];  Service: Orthopedics;  Laterality: Left;  I&D LEFT ACHILLIES TENDON  . Eye surgery      right eye  . Resection distal clavical  09/17/2011    Procedure: RESECTION DISTAL CLAVICAL;  Surgeon: Nita Sells, MD;  Location: [facility];  Service: Orthopedics;  Laterality: Right;  right shoulder arthroscopy with sad and open  distal clavicle excision   . Esophagogastroduodenoscopy N/A 06/25/2012    Procedure: ESOPHAGOGASTRODUODENOSCOPY (EGD);  Surgeon: Milus Banister, MD;  Location: [facility];  Service: Endoscopy;  Laterality: N/A;  . Robot assisted laparoscopic radical prostatectomy N/A 12/16/2012    Procedure: ROBOTIC ASSISTED LAPAROSCOPIC PROSTATECTOMY ;  Surgeon: Ardis Hughs, MD;  Location: [facility];  Service: Urology;  Laterality: N/A;   Family History  Problem Relation Age of Onset  . CAD Mother 27  . CAD Sister   . CAD  Brother 41    died from MI at age 49yo  . Hypertension     History  Substance Use Topics  . Smoking status: Former Smoker -- 0.50 packs/day for 30 years    Types: Cigarettes    Quit date: 06/24/2001  . Smokeless tobacco: Not on file  . Alcohol Use: No     Comment: Quit about several months ago.    Review of Systems  Constitutional:       Per HPI, otherwise negative  HENT:       Per HPI, otherwise negative  Respiratory:       Per HPI, otherwise negative  Cardiovascular:       Per HPI, otherwise negative  Gastrointestinal: Negative for vomiting.  Endocrine:       Negative aside from HPI  Genitourinary:       Neg aside from HPI   Musculoskeletal:       Per HPI, otherwise negative  Skin: Negative.   Neurological: Negative for syncope.    Allergies  Heparin and Thorazine  Home Medications   Current Outpatient Rx  Name  Route  Sig  Dispense  Refill  . ARIPiprazole (ABILIFY) 10 MG tablet   Oral   Take 10 mg by mouth at bedtime.         . carvedilol (COREG) 12.5 MG tablet   Oral   Take 18.75 mg by mouth 2 (two) times daily with a meal.         . cloNIDine (CATAPRES) 0.2 MG tablet   Oral   Take 0.2 mg by mouth 2 (two) times daily.         . furosemide (LASIX) 40 MG tablet   Oral   Take 2 tablets (80 mg total) by mouth 2 (two) times daily.   120 tablet   6   . lisinopril (PRINIVIL,ZESTRIL) 20 MG tablet   Oral   Take 30 mg by mouth daily. Take half a tablet (10mg  ) every morning and 1 tablet (20mg ) every evening.         . metFORMIN (GLUCOPHAGE) 500 MG tablet   Oral   Take 1 tablet (500 mg total) by mouth 2 (two) times daily with a meal.   60 tablet   1   . oxyCODONE-acetaminophen (PERCOCET) 7.5-325 MG per tablet   Oral   Take 1 tablet by mouth 3 (three) times daily.         Marland Kitchen oxymorphone (OPANA ER) 10 MG 12 hr tablet   Oral   Take 10 mg by mouth every 12 (twelve) hours.         . pantoprazole (PROTONIX) 40 MG tablet   Oral   Take 40 mg  by mouth 2 (two) times daily.         . potassium chloride (K-DUR) 10 MEQ tablet   Oral   Take 1 tablet (10 mEq total) by mouth 2 (two) times daily.   60 tablet   2   .  rosuvastatin (CRESTOR) 20 MG tablet   Oral   Take 20 mg by mouth daily.         . traZODone (DESYREL) 100 MG tablet   Oral   Take 100 mg by mouth at bedtime.          BP 149/79  Pulse 58  Temp(Src) 98.3 F (36.8 C) (Oral)  Resp 14  Ht 5\' 11"  (1.803 m)  Wt 270 lb (122.471 kg)  BMI 37.67 kg/m2  SpO2 97% Physical Exam  Nursing note and vitals reviewed. Constitutional: He is oriented to person, place, and time. He appears well-developed. No distress.  HENT:  Head: Normocephalic and atraumatic.  Eyes: Conjunctivae and EOM are normal.  Cardiovascular: Normal rate and regular rhythm.   Pulmonary/Chest: Effort normal. No stridor. He has decreased breath sounds.  Abdominal: He exhibits no distension.  Musculoskeletal: He exhibits no edema.  Patient has distal lower extremity edema bilaterally, worse on the right, with distention of the right calf. Patient can move both knees, ankles, all the toes appropriately.   Neurological: He is alert and oriented to person, place, and time.  Skin: Skin is warm and dry.  Poor skin condition on both feet bilaterally, with no overt evidence of cellulitis.  Psychiatric: He has a normal mood and affect.    ED Course  Procedures (including critical care time) Labs Review Labs Reviewed  CBC WITH DIFFERENTIAL  COMPREHENSIVE METABOLIC PANEL  URINALYSIS, ROUTINE W REFLEX MICROSCOPIC  PRO B NATRIURETIC PEPTIDE  PROTIME-INR  D-DIMER, QUANTITATIVE   Imaging Review No results found.  EKG Interpretation   None      11:12 AM Patient in no distress. Patient aware of all results. He will followup with his primary care team tomorrow MDM   1. Lower extremity edema    Patient presents with concerns of lower extremity edema.  Patient's multiple medical problems,  include diabetes. On exam patient is awake alert.  There is discernible difference in size between extremities, there is no evidence of cellulitis.  Patient's ultrasound was negative for DVT, and absent distress, with no new complaints, stable vital signs discharged in stable condition to follow up with his primary care team tomorrow.    Carmin Muskrat, MD 02/12/13 1114

## 2013-02-12 NOTE — Discharge Instructions (Signed)
As discussed, it is important that you follow up as soon as possible with your physician for continued management of your condition.  If you develop any new, or concerning changes in your condition, please return to the emergency department immediately.  Edema Edema is an abnormal build-up of fluids in tissues. Because this is partly dependent on gravity (water flows to the lowest place), it is more common in the legs and thighs (lower extremities). It is also common in the looser tissues, like around the eyes. Painless swelling of the feet and ankles is common and increases as a person ages. It may affect both legs and may include the calves or even thighs. When squeezed, the fluid may move out of the affected area and may leave a dent for a few moments. CAUSES   Prolonged standing or sitting in one place for extended periods of time. Movement helps pump tissue fluid into the veins, and absence of movement prevents this, resulting in edema.  Varicose veins. The valves in the veins do not work as well as they should. This causes fluid to leak into the tissues.  Fluid and salt overload.  Injury, burn, or surgery to the leg, ankle, or foot, may damage veins and allow fluid to leak out.  Sunburn damages vessels. Leaky vessels allow fluid to go out into the sunburned tissues.  Allergies (from insect bites or stings, medications or chemicals) cause swelling by allowing vessels to become leaky.  Protein in the blood helps keep fluid in your vessels. Low protein, as in malnutrition, allows fluid to leak out.  Hormonal changes, including pregnancy and menstruation, cause fluid retention. This fluid may leak out of vessels and cause edema.  Medications that cause fluid retention. Examples are sex hormones, blood pressure medications, steroid treatment, or anti-depressants.  Some illnesses cause edema, especially heart failure, kidney disease, or liver disease.  Surgery that cuts veins or lymph  nodes, such as surgery done for the heart or for breast cancer, may result in edema. DIAGNOSIS  Your caregiver is usually easily able to determine what is causing your swelling (edema) by simply asking what is wrong (getting a history) and examining you (doing a physical). Sometimes x-rays, EKG (electrocardiogram or heart tracing), and blood work may be done to evaluate for underlying medical illness. TREATMENT  General treatment includes:  Leg elevation (or elevation of the affected body part).  Restriction of fluid intake.  Prevention of fluid overload.  Compression of the affected body part. Compression with elastic bandages or support stockings squeezes the tissues, preventing fluid from entering and forcing it back into the blood vessels.  Diuretics (also called water pills or fluid pills) pull fluid out of your body in the form of increased urination. These are effective in reducing the swelling, but can have side effects and must be used only under your caregiver's supervision. Diuretics are appropriate only for some types of edema. The specific treatment can be directed at any underlying causes discovered. Heart, liver, or kidney disease should be treated appropriately. HOME CARE INSTRUCTIONS   Elevate the legs (or affected body part) above the level of the heart, while lying down.  Avoid sitting or standing still for prolonged periods of time.  Avoid putting anything directly under the knees when lying down, and do not wear constricting clothing or garters on the upper legs.  Exercising the legs causes the fluid to work back into the veins and lymphatic channels. This may help the swelling go down.  The  pressure applied by elastic bandages or support stockings can help reduce ankle swelling.  A low-salt diet may help reduce fluid retention and decrease the ankle swelling.  Take any medications exactly as prescribed. SEEK MEDICAL CARE IF:  Your edema is not responding to  recommended treatments. SEEK IMMEDIATE MEDICAL CARE IF:   You develop shortness of breath or chest pain.  You cannot breathe when you lay down; or if, while lying down, you have to get up and go to the window to get your breath.  You are having increasing swelling without relief from treatment.  You develop a fever over 102 F (38.9 C).  You develop pain or redness in the areas that are swollen.  Tell your caregiver right away if you have gained 03 lb/1.4 kg in 1 day or 05 lb/2.3 kg in a week. MAKE SURE YOU:   Understand these instructions.  Will watch your condition.  Will get help right away if you are not doing well or get worse. Document Released: 01/12/2005 Document Revised: 07/14/2011 Document Reviewed: 08/31/2007 [facility] Patient Information 2014 [place].

## 2013-02-12 NOTE — Telephone Encounter (Signed)
Error

## 2013-02-13 ENCOUNTER — Telehealth (HOSPITAL_COMMUNITY): Payer: Self-pay | Admitting: Cardiology

## 2013-02-13 NOTE — Telephone Encounter (Signed)
Spoke w/pt, he states he does have edema and increased SOB, especially when moving around his house, he has taken metolazone the past 3 days, he feels it has helped a little but still concerned about edema and SOB, he does not weigh himself daily, appt sch for tomorrow at 3:40

## 2013-02-13 NOTE — Telephone Encounter (Signed)
Returning call to pt from weekend answering service. Pt left message 02/11/13 with c/o swelling in both legs and feet/some pain concerned it has to do with his heart  Returned call- pt is still have LE edema-into legs Increased SOB, and increased fatigue Pt given next available 02/21/13 @ 1:40  Please advise if needed

## 2013-02-14 ENCOUNTER — Ambulatory Visit (HOSPITAL_COMMUNITY)
Admission: RE | Admit: 2013-02-14 | Discharge: 2013-02-14 | Disposition: A | Discharge status: Home / Self Care | Payer: PRIVATE HEALTH INSURANCE | Source: Ambulatory Visit | Attending: Internal Medicine | Admitting: Internal Medicine

## 2013-02-14 VITALS — BP 114/68 | HR 68 | Wt 278.0 lb

## 2013-02-14 DIAGNOSIS — I5042 Chronic combined systolic (congestive) and diastolic (congestive) heart failure: Secondary | ICD-10-CM | POA: Insufficient documentation

## 2013-02-14 DIAGNOSIS — I509 Heart failure, unspecified: Secondary | ICD-10-CM | POA: Insufficient documentation

## 2013-02-14 MED ORDER — TORSEMIDE 20 MG PO TABS: 40.0000 mg | ORAL_TABLET | Freq: Two times a day (BID) | ORAL | Status: DC

## 2013-02-14 MED ORDER — CLONIDINE HCL 0.2 MG PO TABS: 0.1000 mg | ORAL_TABLET | Freq: Two times a day (BID) | ORAL | Status: DC

## 2013-02-14 NOTE — Patient Instructions (Signed)
Follow up in 3 weeks.   Stop lasix  Take torsemide 40 mg twice a day  Take 0.1 mg clonidine twice a day  Do the following things EVERYDAY: 1) Weigh yourself in the morning before breakfast. Write it down and keep it in a log. 2) Take your medicines as prescribed 3) Eat low salt foods-Limit salt (sodium) to 2000 mg per day.  4) Stay as active as you can everyday 5) Limit all fluids for the day to less than 2 liters

## 2013-02-14 NOTE — Progress Notes (Signed)
Patient ID: JIANCARLO LAUGHMAN, male   DOB: March 07, 1955, 58 y.o.   MRN: [id number] Referring Physician: Dr. Silverio Decamp Primary Care: Internal Medicine clinic, Dr. Silverio Decamp Primary Cardiologist: Dr. Doylene Canard  GI: Dr Ardis Hughs.   HPI: Mr Bhuiyan is a 58 y.o. 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   Former cocaine abuse. Had history of acute renal failure in setting of rhabdo requiring short term HD.    Admitted with new onset HF in April 2014.   He was diuresed and discharged home.  Discharge weight 256 pounds. Echo at the time showed EF 123456, grade 1 diastolic dysfunction.    Cath 04/27/12 [facility] RA = 4  RV = 45/3/4  PA = 42/9 (24)  PCW = 14  Fick cardiac output/index = 5.2/2.2  PVR = 1.9 [place]  SVR = 1656  FA sat = 96%  PA sat = 62%, 64% Ao Pressure: 149/84 (11)  LV Pressure: 142/8/29  There was no signficant gradient across the aortic valve on pullback. Arteries normal. LV gram EF= 35-40% global HK  Admitted to [facility] 5/30 through 06/27/12 with GI bleed. EGD 06/25/12 multiple ulcers noted. Continued on protonix.   ECHO 10/27/12 EF 30-35%  He returns for an acute work in due to increased dyspnea and lower extremity edema. He was evaluated in [facility] ED. Denies PND. Sleeps in recliner.Says he has to take breaks when he goes to grocery store. Says he leans on the cart.  He prepares his own medications.   He has an aide 2-3 hours per day. Requires assistance with transportation.   SH: Quit smoking 9 years ago. No alcohol 7 months ago. Lives alone FH: Mom died from MI in her early 59s  Brother MI   Review of Systems: All pertinent positives and negatives as in HPI, otherwise negative.    Past Medical History  Diagnosis Date  . GERD (gastroesophageal reflux disease)   . Schizophrenia, schizo-affective   . Cataract   . Hyperlipidemia   . Wears glasses   . Wears dentures     upper  . Hypertension     uncontrolled with  medication noncompliance  . Hepatitis C   . HIT (heparin-induced thrombocytopenia)   . Continuous chronic alcoholism   . H/O cocaine abuse     none in 6 months  . Stroke 01/2012    Small cerebellar infarcts right greater than left as well as questionable acute left external capsule and caudate nuclear punctate lacunar infarcts noted per MRI (01/2012) - presumed to be embolic likely source PFO with right to left shunt (noted per TEE 01/ 2014)  . Chronic combined systolic and diastolic CHF (congestive heart failure) 02/29/2012    LV EF 40-45% per 2D echo (02/2012) with grade 1 diastolic dysfunction. 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 Degenerative lumbar spinal stenosis     s/p L2-3, L3-4, L4-5 laminectomy partial facetectomy, and bilateral foraminotomy  . History of pancreatitis 01/2011    Admission for acute pancreatitis presumed 2/2 ongoing alcohol abuse- and hypertriglyceridemia  . PFO (patent foramen ovale) 01/2012    with right to left shunt, noted per TEE in evaluation for source of embolic stroke in [id number]  . CKD (chronic kidney disease) stage 2, GFR 60-89 ml/min     BL SCr approximately 1-1.3  . Rhabdomyolysis 02/22/2012    H/O rhabdomyolysis in  01/2012 that was idiopathic, cause never identified  . Hepatic steatosis     suspected 2/2 alcohol abuse  . Splenic cyst   . Colitis 05/2009    History of colitis of ascending colon noted on CT abd/pelvis (05/2009), with interval resolution with subsequent CT  . Hypertriglyceridemia   . Cancer     Prostate cancer-bx. 3 weeks ago  . History of ETOH abuse 12-13-12     no use in 6 months    Current Outpatient Prescriptions  Medication Sig Dispense Refill  . ARIPiprazole (ABILIFY) 10 MG tablet Take 10 mg by mouth at bedtime.      . carvedilol (COREG) 12.5 MG tablet Take 18.75 mg by mouth 2 (two) times daily with a meal.      . cloNIDine (CATAPRES) 0.2 MG tablet Take 0.2  mg by mouth 2 (two) times daily.      . furosemide (LASIX) 40 MG tablet Take 2 tablets (80 mg total) by mouth 2 (two) times daily.  120 tablet  6  . lisinopril (PRINIVIL,ZESTRIL) 20 MG tablet Take 30 mg by mouth daily. Take half a tablet (10mg  ) every morning and 1 tablet (20mg ) every evening.      . metFORMIN (GLUCOPHAGE) 500 MG tablet Take 1 tablet (500 mg total) by mouth 2 (two) times daily with a meal.  60 tablet  1  . metolazone (ZAROXOLYN) 2.5 MG tablet Take 2.5 mg by mouth daily. For 2 days.      Marland Kitchen oxyCODONE-acetaminophen (PERCOCET) 7.5-325 MG per tablet Take 1 tablet by mouth 3 (three) times daily.      Marland Kitchen oxymorphone (OPANA ER) 10 MG 12 hr tablet Take 10 mg by mouth every 12 (twelve) hours.      . pantoprazole (PROTONIX) 40 MG tablet Take 40 mg by mouth 2 (two) times daily.      . potassium chloride (K-DUR) 10 MEQ tablet Take 1 tablet (10 mEq total) by mouth 2 (two) times daily.  60 tablet  2  . rosuvastatin (CRESTOR) 20 MG tablet Take 20 mg by mouth daily.      . traZODone (DESYREL) 100 MG tablet Take 100 mg by mouth at bedtime.       No current facility-administered medications for this encounter.    Allergies  Allergen Reactions  . Heparin Other (See Comments)    Documented HIT under problem list Pt states he's not allergic.  Marland Kitchen Thorazine [Chlorpromazine Hcl] Other (See Comments)    Body freezes up    PHYSICAL EXAM: Filed Vitals:   02/14/13 1529  BP: 114/68  Pulse: 68  Weight: 278 lb (126.1 kg)  SpO2: 93%    General:  NAD. No respiratory difficulty HEENT: normal Neck: supple. JVP difficult to see due to body habitus.   Carotids 2+ bilat; no bruits. No lymphadenopathy or thryomegaly appreciated. Cor: PMI nondisplaced. Regular rate & rhythm. No rubs, gallops, Very soft TR murmur. Lungs: clear Abdomen: soft, nontender, ++ distention. No hepatosplenomegaly. No bruits or masses. Good bowel sounds. Extremities: no cyanosis, clubbing, rash, L>R 3+ edema  Neuro: alert &  oriented x 3, cranial nerves grossly intact. moves all 4 extremities w/o difficulty. Flat Affect    ASSESSMENT /PLAN 1) Chronic systolic/diastolic HF due to NICM . ECHO 30-35% NYHA IIIB. Volume status  elevated. Stop lasix and switch to 40 mg torsemide twice a day. Will not increase carvedilol due to increased volume status.  Continue carvedilol 18.7 5 mg twice a day. Later consider digoxin.  Continue  lisinopril 10 mg in am and 20 mg in pm  Reinforced daily weights, low salt food choices, and medication compliance. Refer to EP for possible ICD  2) HTN Stable. Cut back clonidine to 0.1 mg twice a day. Hopefully can stop at next visit and add spiro.    Follow up in 3 weeks to reassess volume status. Hopefully next visit can increase carvedilol   CLEGG,AMY,NP-C 3:43 PM  Patient seen and examined with Darrick Grinder, NP. We discussed all aspects of the encounter. I agree with the assessment and plan as stated above.   He is markedly volume overloaded. Agree with switching to torsemide. Reinforced need for dietary modification, daily weights and reviewed use of sliding scale diuretics. Wean clonidine as tolerated. See back soon for recheck. Refer to EP for ICD eval.   Benay Spice 7:48 PM

## 2013-02-21 ENCOUNTER — Encounter (HOSPITAL_COMMUNITY): Payer: PRIVATE HEALTH INSURANCE

## 2013-03-01 ENCOUNTER — Ambulatory Visit (INDEPENDENT_AMBULATORY_CARE_PROVIDER_SITE_OTHER): Payer: PRIVATE HEALTH INSURANCE | Admitting: Internal Medicine

## 2013-03-01 ENCOUNTER — Telehealth: Payer: Self-pay | Admitting: *Deleted

## 2013-03-01 ENCOUNTER — Encounter: Payer: Self-pay | Admitting: Internal Medicine

## 2013-03-01 ENCOUNTER — Encounter: Payer: Self-pay | Admitting: *Deleted

## 2013-03-01 VITALS — BP 126/74 | Ht 71.0 in | Wt 276.1 lb

## 2013-03-01 DIAGNOSIS — I5042 Chronic combined systolic (congestive) and diastolic (congestive) heart failure: Secondary | ICD-10-CM

## 2013-03-01 DIAGNOSIS — I428 Other cardiomyopathies: Secondary | ICD-10-CM

## 2013-03-01 DIAGNOSIS — Z01812 Encounter for preprocedural laboratory examination: Secondary | ICD-10-CM

## 2013-03-01 DIAGNOSIS — I509 Heart failure, unspecified: Secondary | ICD-10-CM

## 2013-03-01 NOTE — Progress Notes (Signed)
HPI [name] is referred today by Dr. Jeffie Pollock for consideration for ICD implantation. The patient has a longstanding non-ischemic CM, chronic class 2b/3a CHF despite maximal medical therapy, remote cerebellar stroke which has essentially resolved, very remote substance abuse, and HTN. He has not had syncope and his QRS duration is 116 ms. He denies chest pain. He has intermittant peripheral edema. He denies sodium indiscretion. His EF was 35% a year ago,  Allergies  Allergen Reactions  . Heparin Other (See Comments)    Documented HIT under problem list Pt states he's not allergic.  Marland Kitchen Thorazine [Chlorpromazine Hcl] Other (See Comments)    Body freezes up     Current Outpatient Prescriptions  Medication Sig Dispense Refill  . ARIPiprazole (ABILIFY) 10 MG tablet Take 10 mg by mouth at bedtime.      . carvedilol (COREG) 12.5 MG tablet Take 18.75 mg by mouth 2 (two) times daily with a meal.      . cloNIDine (CATAPRES) 0.2 MG tablet Take 0.5 tablets (0.1 mg total) by mouth 2 (two) times daily.  30 tablet  4  . lisinopril (PRINIVIL,ZESTRIL) 20 MG tablet Take 30 mg by mouth daily. Take half a tablet (10mg  ) every morning and 1 tablet (20mg ) every evening.      . metFORMIN (GLUCOPHAGE) 500 MG tablet Take 1 tablet (500 mg total) by mouth 2 (two) times daily with a meal.  60 tablet  1  . metolazone (ZAROXOLYN) 2.5 MG tablet Take 2.5 mg by mouth daily. For 2 days.      Marland Kitchen oxyCODONE-acetaminophen (PERCOCET) 7.5-325 MG per tablet Take 1 tablet by mouth 3 (three) times daily.      Marland Kitchen oxymorphone (OPANA ER) 10 MG 12 hr tablet Take 10 mg by mouth every 12 (twelve) hours.      . pantoprazole (PROTONIX) 40 MG tablet Take 40 mg by mouth daily.       . potassium chloride (K-DUR) 10 MEQ tablet Take 1 tablet (10 mEq total) by mouth 2 (two) times daily.  60 tablet  2  . rosuvastatin (CRESTOR) 20 MG tablet Take 20 mg by mouth daily.      Marland Kitchen torsemide (DEMADEX) 20 MG tablet Take 2 tablets (40 mg total)  by mouth 2 (two) times daily.  120 tablet  6  . traZODone (DESYREL) 100 MG tablet Take 100 mg by mouth at bedtime.       No current facility-administered medications for this visit.     Past Medical History  Diagnosis Date  . GERD (gastroesophageal reflux disease)   . Schizophrenia, schizo-affective   . Cataract   . Hyperlipidemia   . Wears glasses   . Wears dentures     upper  . Hypertension     uncontrolled with medication noncompliance  . Hepatitis C   . HIT (heparin-induced thrombocytopenia)   . Continuous chronic alcoholism   . H/O cocaine abuse     none in 6 months  . Stroke 01/2012    Small cerebellar infarcts right greater than left as well as questionable acute left external capsule and caudate nuclear punctate lacunar infarcts noted per MRI (01/2012) - presumed to be embolic likely source PFO with right to left shunt (noted per TEE 01/ 2014)  . Chronic combined systolic and diastolic CHF (congestive heart failure) 02/29/2012    LV EF 40-45% per 2D echo (02/2012) with grade 1 diastolic dysfunction. Presumed to be 2/2 NICM in setting of dilated cardiomyopathy due  to alcohol abuse and uncontrolled HTN  . Degenerative lumbar spinal stenosis     s/p L2-3, L3-4, L4-5 laminectomy partial facetectomy, and bilateral foraminotomy  . History of pancreatitis 01/2011    Admission for acute pancreatitis presumed 2/2 ongoing alcohol abuse- and hypertriglyceridemia  . PFO (patent foramen ovale) 01/2012    with right to left shunt, noted per TEE in evaluation for source of embolic stroke in [id number]  . CKD (chronic kidney disease) stage 2, GFR 60-89 ml/min     BL SCr approximately 1-1.3  . 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 Hepatic steatosis     suspected 2/2 alcohol abuse  . Splenic cyst   . Colitis 05/2009    History of colitis of ascending colon noted on CT abd/pelvis (05/2009), with interval resolution with  subsequent CT  . Hypertriglyceridemia   . Cancer     Prostate cancer-bx. 3 weeks ago  . History of ETOH abuse 12-13-12     no use in 6 months    ROS:   All systems reviewed and negative except as noted in the HPI.   Past Surgical History  Procedure Laterality Date  . L2-3, l3-4, l4-5 laminectomy, partial facetectomy    . Left achilles Right 2007  . Tonsillectomy    . Knee arthroscopy    . I&d extremity  03/20/2011    Procedure: IRRIGATION AND DEBRIDEMENT EXTREMITY;  Surgeon: Kerin Salen, MD;  Location: [facility];  Service: Orthopedics;  Laterality: Left;  I&D LEFT ACHILLIES TENDON  . Eye surgery      right eye  . Resection distal clavical  09/17/2011    Procedure: RESECTION DISTAL CLAVICAL;  Surgeon: Nita Sells, MD;  Location: [facility];  Service: Orthopedics;  Laterality: Right;  right shoulder arthroscopy with sad and open distal clavicle excision   . Esophagogastroduodenoscopy N/A 06/25/2012    Procedure: ESOPHAGOGASTRODUODENOSCOPY (EGD);  Surgeon: Milus Banister, MD;  Location: [facility];  Service: Endoscopy;  Laterality: N/A;  . Robot assisted laparoscopic radical prostatectomy N/A 12/16/2012    Procedure: ROBOTIC ASSISTED LAPAROSCOPIC PROSTATECTOMY ;  Surgeon: Ardis Hughs, MD;  Location: [facility];  Service: Urology;  Laterality: N/A;     Family History  Problem Relation Age of Onset  . CAD Mother 17  . CAD Sister   . CAD Brother 43    died from MI at age 12yo  . Hypertension       History   Social History  . Marital Status: Single    Spouse Name: N/A    Number of Children: N/A  . Years of Education: 12th grade   Occupational History  . Disability     2/2 schizophrenia   Social History Main Topics  . Smoking status: Former Smoker -- 0.50 packs/day for 30 years    Types: Cigarettes    Quit date: 06/24/2001  . Smokeless tobacco: Not on file  . Alcohol Use: No     Comment: Quit about several months ago.  . Drug Use: Not  on file     Comment: history of cocaine abuse- none in 6 months(reminded not to use)  . Sexual Activity: Not on file   Other Topics Concern  . Not on file   Social History Narrative   Lives in [place] alone, has a [facility] aide that helps with medications 4-5 days a week with medications, helping to clean.     BP  126/74  Ht 5\' 11"  (1.803 m)  Wt 276 lb 1.9 oz (125.247 kg)  BMI 38.53 kg/m2  Physical Exam:  Well appearing NAD HEENT: Unremarkable Neck:  No JVD, no thyromegally Lymphatics:  No adenopathy Back:  No CVA tenderness Lungs:  Clear HEART:  Regular rate rhythm, no murmurs, no rubs, no clicks Abd:  soft, positive bowel sounds, no organomegally, no rebound, no guarding Ext:  2 plus pulses, no edema, no cyanosis, no clubbing Skin:  No rashes no nodules Neuro:  CN II through XII intact, motor grossly intact  EKG  DEVICE  Normal device function.  See PaceArt for details.   Assess/Plan:

## 2013-03-01 NOTE — Patient Instructions (Signed)
Your physician has recommended that you have a defibrillator inserted. An implantable cardioverter defibrillator (ICD) is a small device that is placed in your chest or, in rare cases, your abdomen. This device uses electrical pulses or shocks to help control life-threatening, irregular heartbeats that could lead the heart to suddenly stop beating (sudden cardiac arrest). Leads are attached to the ICD that goes into your heart. This is done in the hospital and usually requires an overnight stay. Please see the instruction sheet given to you today for more information.   Scheduled 03/15/13, be at hospital at 6:30 am.  Your physician recommends that you return for pre procedure lab work on 2/13  Your physician recommends that you schedule a follow-up appointment in: 03/27/13 for wound check

## 2013-03-01 NOTE — Telephone Encounter (Signed)
Called pt to inform him of ejection fraction mix up. Explained that Dr. Haroldine Laws referred him for EF > 30%, however after further investigation, EF is > 40%, and as such doesn't qualify for ICD at this time. Explained to pt that Dr. Lovena Le and Bensimhon discussed situation - pt will have a follow up echo in 4 months with Bensimhon. His scheduled procedure was cancelled. Pt verbalized understanding and agreeable to plan.

## 2013-03-08 ENCOUNTER — Encounter (HOSPITAL_COMMUNITY): Payer: PRIVATE HEALTH INSURANCE

## 2013-03-08 ENCOUNTER — Other Ambulatory Visit: Payer: Self-pay | Admitting: Internal Medicine

## 2013-03-09 ENCOUNTER — Encounter (HOSPITAL_COMMUNITY): Payer: PRIVATE HEALTH INSURANCE

## 2013-03-10 ENCOUNTER — Other Ambulatory Visit: Payer: PRIVATE HEALTH INSURANCE

## 2013-03-15 ENCOUNTER — Encounter: Payer: Self-pay | Admitting: Licensed Clinical Social Worker

## 2013-03-15 ENCOUNTER — Ambulatory Visit (HOSPITAL_COMMUNITY)
Admission: RE | Admit: 2013-03-15 | Payer: PRIVATE HEALTH INSURANCE | Source: Ambulatory Visit | Admitting: Internal Medicine

## 2013-03-15 ENCOUNTER — Encounter (HOSPITAL_COMMUNITY): Admission: RE | Payer: Self-pay | Source: Ambulatory Visit

## 2013-03-15 SURGERY — IMPLANTABLE CARDIOVERTER DEFIBRILLATOR IMPLANT
Anesthesia: LOCAL

## 2013-03-15 NOTE — Progress Notes (Signed)
Patient ID: [name] [name], male   DOB: 09/21/1955, 58 y.o.   MRN: [id number] CSW received request for add'l PCS hours on behalf of [name].  CSW placed call to [facility].  Pt receives 50 or less hours a week.  CSW informed Caroleen Hamman, pt has an appointment 03/23/13 with PCP and will request PCP address at that time.  CSW placed call to [name] and notified pt, request for add'l hours will be addressed at his 2/26 appt with Dr. Murlean Caller at [facility].  Pt aware.

## 2013-03-15 NOTE — Progress Notes (Signed)
Thanks

## 2013-03-19 ENCOUNTER — Other Ambulatory Visit: Payer: Self-pay | Admitting: Internal Medicine

## 2013-03-22 ENCOUNTER — Ambulatory Visit (HOSPITAL_COMMUNITY)
Admission: RE | Admit: 2013-03-22 | Discharge: 2013-03-22 | Disposition: A | Discharge status: Home / Self Care | Payer: PRIVATE HEALTH INSURANCE | Source: Ambulatory Visit | Attending: Internal Medicine | Admitting: Internal Medicine

## 2013-03-22 VITALS — BP 152/94 | HR 83 | Wt 291.8 lb

## 2013-03-22 DIAGNOSIS — I5042 Chronic combined systolic (congestive) and diastolic (congestive) heart failure: Secondary | ICD-10-CM | POA: Insufficient documentation

## 2013-03-22 DIAGNOSIS — I509 Heart failure, unspecified: Secondary | ICD-10-CM

## 2013-03-22 MED ORDER — METOLAZONE 2.5 MG PO TABS: 2.5000 mg | ORAL_TABLET | ORAL | Status: DC

## 2013-03-22 MED ORDER — POTASSIUM CHLORIDE ER 10 MEQ PO TBCR: 10.0000 meq | EXTENDED_RELEASE_TABLET | Freq: Two times a day (BID) | ORAL | Status: DC

## 2013-03-22 NOTE — Patient Instructions (Signed)
Take Metolazone for 3 days (today, tomorrow and Friday)  When you take Metolazone take an extra 2 potassium pills  Your physician recommends that you schedule a follow-up appointment in: 1 week

## 2013-03-22 NOTE — Addendum Note (Signed)
Encounter addended by: Scarlette Calico, RN on: 03/22/2013 11:29 AM<BR>     Documentation filed: Orders, Patient Instructions Section

## 2013-03-22 NOTE — Progress Notes (Signed)
Patient ID: [name] [name], male   DOB: 01-25-1956, 58 y.o.   MRN: [id number] Referring Physician: Dr. Silverio Decamp Primary Care: Internal Medicine clinic, Dr. Silverio Decamp Primary Cardiologist: Dr. Doylene Canard  GI: Dr Ardis Hughs.   HPI: [name] [name] is a 52 y.o. 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   Former cocaine abuse. Had history of acute renal failure in setting of rhabdo requiring short term HD.    Admitted with new onset HF in April 2014.   He was diuresed and discharged home.  Discharge weight 256 pounds. Echo at the time showed EF 123456, grade 1 diastolic dysfunction.   Echo 10/14 EF 40-45%   Cath 04/27/12 [facility] RA = 4  RV = 45/3/4  PA = 42/9 (24)  PCW = 14  Fick cardiac output/index = 5.2/2.2  PVR = 1.9 [place]  SVR = 1656  FA sat = 96%  PA sat = 62%, 64% Ao Pressure: 149/84 (11)  LV Pressure: 142/8/29  There was no signficant gradient across the aortic valve on pullback. Arteries normal. LV gram EF= 35-40% global HK  Admitted to [facility] 5/30 through 06/27/12 with GI bleed. EGD 06/25/12 multiple ulcers noted. Continued on protonix.   Follow-up: Saw Dr. Lovena Le and felt not to be candidate for ICD given EF > 35%. Has worsening DOE over past week. No orthopnea or PND. Drinking lots of Kool-Aid. Weight up 11 pounds. Says he weights every day but not taking extra diuretics. + edema. Weight up 15 pounds in clinic. Says he skips doses of torsemide about 2x/week because it makes him pee too much.   SH: Quit smoking 9 years ago. No alcohol 7 months ago. Lives alone FH: Mom died from MI in her early 3s  Brother MI   Review of Systems: All pertinent positives and negatives as in HPI, otherwise negative.    Past Medical History  Diagnosis Date  . GERD (gastroesophageal reflux disease)   . Schizophrenia, schizo-affective   . Cataract   . Hyperlipidemia   . Wears glasses   . Wears dentures     upper  . Hypertension     uncontrolled with medication noncompliance  . Hepatitis C   . HIT (heparin-induced thrombocytopenia)   . Continuous chronic alcoholism   . H/O cocaine abuse     none in 6 months  . Stroke 01/2012    Small cerebellar infarcts right greater than left as well as questionable acute left external capsule and caudate nuclear punctate lacunar infarcts noted per MRI (01/2012) - presumed to be embolic likely source PFO with right to left shunt (noted per TEE 01/ 2014)  . Chronic combined systolic and diastolic CHF (congestive heart failure) 02/29/2012    LV EF 40-45% per 2D echo (02/2012) with grade 1 diastolic dysfunction. 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 Degenerative lumbar spinal stenosis     s/p L2-3, L3-4, L4-5 laminectomy partial facetectomy, and bilateral foraminotomy  . History of pancreatitis 01/2011    Admission for acute pancreatitis presumed 2/2 ongoing alcohol abuse- and hypertriglyceridemia  . PFO (patent foramen ovale) 01/2012    with right to left shunt, noted per TEE in evaluation for source of embolic stroke in [id number]  . CKD (chronic kidney disease) stage 2, GFR 60-89 ml/min     BL SCr approximately 1-1.3  . Rhabdomyolysis 02/22/2012  H/O rhabdomyolysis in 01/2012 that was idiopathic, cause never identified  . Hepatic steatosis     suspected 2/2 alcohol abuse  . Splenic cyst   . Colitis 05/2009    History of colitis of ascending colon noted on CT abd/pelvis (05/2009), with interval resolution with subsequent CT  . Hypertriglyceridemia   . Cancer     Prostate cancer-bx. 3 weeks ago  . History of ETOH abuse 12-13-12     no use in 6 months    Current Outpatient Prescriptions  Medication Sig Dispense Refill  . ARIPiprazole (ABILIFY) 10 MG tablet Take 10 mg by mouth at bedtime.      . carvedilol (COREG) 12.5 MG tablet Take 18.75 mg by mouth 2 (two) times daily with a meal.      . cloNIDine (CATAPRES) 0.2  MG tablet Take 0.5 tablets (0.1 mg total) by mouth 2 (two) times daily.  30 tablet  4  . CRESTOR 20 MG tablet take 1 tablet by mouth once daily  90 tablet  1  . lisinopril (PRINIVIL,ZESTRIL) 20 MG tablet Take half a tablet (10mg  ) every morning and 1 tablet (20mg ) every evening.      . metFORMIN (GLUCOPHAGE) 500 MG tablet take 1 tablet by mouth twice a day with food  60 tablet  1  . metolazone (ZAROXOLYN) 2.5 MG tablet Take 2.5 mg by mouth daily. For 2 days.      Marland Kitchen oxyCODONE-acetaminophen (PERCOCET) 7.5-325 MG per tablet Take 1 tablet by mouth 3 (three) times daily.      Marland Kitchen oxymorphone (OPANA ER) 10 MG 12 hr tablet Take 10 mg by mouth every 12 (twelve) hours.      . pantoprazole (PROTONIX) 40 MG tablet Take 40 mg by mouth 2 (two) times daily.       . potassium chloride (K-DUR) 10 MEQ tablet Take 1 tablet (10 mEq total) by mouth 2 (two) times daily.  60 tablet  2  . torsemide (DEMADEX) 20 MG tablet Take 2 tablets (40 mg total) by mouth 2 (two) times daily.  120 tablet  6  . traZODone (DESYREL) 100 MG tablet Take 100 mg by mouth at bedtime.       No current facility-administered medications for this encounter.    Allergies  Allergen Reactions  . Heparin Other (See Comments)    Documented HIT under problem list Pt states he's not allergic.  Marland Kitchen Thorazine [Chlorpromazine Hcl] Other (See Comments)    Body freezes up    PHYSICAL EXAM: Filed Vitals:   03/22/13 1043  BP: 152/94  Pulse: 83  Weight: 291 lb 12 oz (132.337 kg)  SpO2: 95%    General:  NAD. No respiratory difficulty HEENT: normal Neck: supple. JVP difficult to see due to body habitus.   Carotids 2+ bilat; no bruits. No lymphadenopathy or thryomegaly appreciated. Cor: PMI nondisplaced. Regular rate & rhythm. No rubs, gallops, Very soft TR murmur. Lungs: clear Abdomen: soft, nontender, ++ distention. No hepatosplenomegaly. No bruits or masses. Good bowel sounds. Extremities: no cyanosis, clubbing, rash, L>R 3+ edema  Neuro:  alert & oriented x 3, cranial nerves grossly intact. moves all 4 extremities w/o difficulty. Flat Affect   ASSESSMENT /PLAN 1) Chronic systolic/diastolic HF due to NICM . ECHO 30-35% NYHA III. Volume status  elevated.  Continue torsemide 40 bid. Will give metolazone 2.5 for 3 days (with KCL 20 extra each time) AND whenever weight up 3 pounds or more from baseline.  Continue carvedilol 18.7 5 mg twice  a day.  Continue lisinopril 10 mg in am and 20 mg in pm  Long talk about need for daily weights, low salt food choices, and medication compliance.   2) HTN Stable. BP up today in setting of volume overload. Will diurese and see what happens. Ideally would like to stop clonidine and add spiro.   Follow up next week with labs. Consider Paramedicine program.   Benay Spice 11:11 AM

## 2013-03-23 ENCOUNTER — Ambulatory Visit: Payer: PRIVATE HEALTH INSURANCE | Admitting: Internal Medicine

## 2013-03-27 ENCOUNTER — Telehealth: Payer: Self-pay | Admitting: *Deleted

## 2013-03-27 ENCOUNTER — Ambulatory Visit: Payer: PRIVATE HEALTH INSURANCE

## 2013-03-27 NOTE — Telephone Encounter (Signed)
Agree. Thanks

## 2013-03-27 NOTE — Telephone Encounter (Signed)
Pt called for an appointment.   C/o numbness to thumb and first 2 fingers on left hand.  Onset a month ago. No known injury. Good movement to that hand.  Will see tomorrow at 10:15

## 2013-03-28 ENCOUNTER — Encounter: Payer: Self-pay | Admitting: Internal Medicine

## 2013-03-28 ENCOUNTER — Ambulatory Visit (INDEPENDENT_AMBULATORY_CARE_PROVIDER_SITE_OTHER): Payer: PRIVATE HEALTH INSURANCE | Admitting: Internal Medicine

## 2013-03-28 VITALS — BP 109/74 | HR 62 | Temp 96.7°F | Ht 71.0 in | Wt 281.8 lb

## 2013-03-28 DIAGNOSIS — R200 Anesthesia of skin: Secondary | ICD-10-CM | POA: Insufficient documentation

## 2013-03-28 DIAGNOSIS — E119 Type 2 diabetes mellitus without complications: Secondary | ICD-10-CM

## 2013-03-28 DIAGNOSIS — E785 Hyperlipidemia, unspecified: Secondary | ICD-10-CM

## 2013-03-28 DIAGNOSIS — M19019 Primary osteoarthritis, unspecified shoulder: Secondary | ICD-10-CM

## 2013-03-28 DIAGNOSIS — M67439 Ganglion, unspecified wrist: Secondary | ICD-10-CM

## 2013-03-28 DIAGNOSIS — R209 Unspecified disturbances of skin sensation: Secondary | ICD-10-CM

## 2013-03-28 LAB — HM DIABETES EYE EXAM

## 2013-03-28 LAB — POCT GLYCOSYLATED HEMOGLOBIN (HGB A1C): HEMOGLOBIN A1C: 7.4

## 2013-03-28 LAB — GLUCOSE, CAPILLARY: Glucose-Capillary: 121 mg/dL — ABNORMAL HIGH (ref 70–99)

## 2013-03-28 NOTE — Progress Notes (Signed)
Case discussed with Dr. Schooler soon after the resident saw the patient.  We reviewed the resident's history and exam and pertinent patient test results.  I agree with the assessment, diagnosis, and plan of care documented in the resident's note. 

## 2013-03-28 NOTE — Assessment & Plan Note (Signed)
Concern for possible nerve entrapment at wrist. No pain or muscle weakness. Negative Phalen's and Tinel's maneuver. Will order nerve conduction study.

## 2013-03-28 NOTE — Progress Notes (Signed)
Referral information was faxed to [facility] for appointment scheduling.  Sander Nephew, RN 03/28/2013 2:45 PM.

## 2013-03-28 NOTE — Patient Instructions (Signed)
General Instructions:  We will refer you to have the nerves check on your left wrist and fingers.  Treatment Goals:  Goals (1 Years of Data) as of 03/28/13         As of Today 03/22/13 03/01/13 02/14/13 02/12/13     Blood Pressure    . Blood Pressure < 140/90  109/74 152/94 126/74 114/68 132/75     Lifestyle    . Prevent Falls           Result Component    . HEMOGLOBIN A1C < 7.0  7.4        . LDL CALC < 100            Progress Toward Treatment Goals:  Treatment Goal 03/28/2013  Hemoglobin A1C deteriorated  Blood pressure at goal    Self Care Goals & Plans:  Self Care Goal 03/28/2013  Manage my medications take my medicines as prescribed; bring my medications to every visit  Monitor my health -  Eat healthy foods drink diet soda or water instead of juice or soda; eat more vegetables; eat foods that are low in salt; eat baked foods instead of fried foods; eat fruit for snacks and desserts  Be physically active -    Home Blood Glucose Monitoring 03/28/2013  Check my blood sugar no home glucose monitoring     Care Management & Community Referrals:  Referral 03/31/2012  Referrals made for care management support none needed

## 2013-03-28 NOTE — Progress Notes (Signed)
   Subjective:    Patient ID: [name] [name], male    DOB: Jun 06, 1955, 58 y.o.   MRN: [id number]  HPI  States that after waking up about 4 weeks ago had numbness in his left 1st (thumb), 2ng (index), 3rd (middle) finger tips. This has not resolved and has resulted in him favoring his right side to sleep on at night. No change or weakness in grasp, no pain, no pins and needles. Hx significant for arthritis of the left shoulder. Denies wrist pain or injury.  HgbA1c increased to 7.4 from 5.8. Denies dietary indiscretions and reports compliance with metformin.  Review of Systems  Constitutional: Negative for fever and fatigue.  HENT: Negative.   Eyes: Negative for visual disturbance.  Respiratory: Negative for shortness of breath.   Cardiovascular: Negative for chest pain and leg swelling.  Gastrointestinal: Positive for constipation. Negative for nausea and diarrhea.  Endocrine: Negative.   Genitourinary: Negative.   Skin: Negative.   Neurological: Positive for numbness. Negative for headaches.       Left finger tips numb  Hematological: Negative.   Psychiatric/Behavioral: Negative.        Objective:   Physical Exam  Constitutional: He is oriented to person, place, and time. He appears well-developed and well-nourished. No distress.  HENT:  Head: Normocephalic and atraumatic.  Eyes: Conjunctivae and EOM are normal. Pupils are equal, round, and reactive to light.  Neck: Normal range of motion. Neck supple.  Cardiovascular: Normal rate, regular rhythm, normal heart sounds and intact distal pulses.   Pulmonary/Chest: Effort normal and breath sounds normal.  Abdominal: Soft. Bowel sounds are normal.  Musculoskeletal: He exhibits tenderness. He exhibits no edema.  Right wrist with soft, fluctuant nodule  Neurological: He is alert and oriented to person, place, and time. He displays normal reflexes. No cranial nerve deficit. Coordination normal.  Negative phalen's and tinnel maneuver  with wrist  Skin: Skin is warm and dry.  Xerosis of feet bilaterally          Assessment & Plan:   separate problem list charting:

## 2013-03-29 ENCOUNTER — Ambulatory Visit (HOSPITAL_COMMUNITY)
Admission: RE | Admit: 2013-03-29 | Discharge: 2013-03-29 | Disposition: A | Discharge status: Home / Self Care | Payer: PRIVATE HEALTH INSURANCE | Source: Ambulatory Visit | Attending: Internal Medicine | Admitting: Internal Medicine

## 2013-03-29 VITALS — BP 110/72 | HR 64 | Wt 282.0 lb

## 2013-03-29 DIAGNOSIS — I10 Essential (primary) hypertension: Secondary | ICD-10-CM | POA: Insufficient documentation

## 2013-03-29 DIAGNOSIS — I509 Heart failure, unspecified: Secondary | ICD-10-CM

## 2013-03-29 DIAGNOSIS — I5042 Chronic combined systolic (congestive) and diastolic (congestive) heart failure: Secondary | ICD-10-CM | POA: Insufficient documentation

## 2013-03-29 LAB — BASIC METABOLIC PANEL
BUN: 42 mg/dL — AB (ref 6–23)
CHLORIDE: 99 meq/L (ref 96–112)
CO2: 28 mEq/L (ref 19–32)
CREATININE: 1.23 mg/dL (ref 0.50–1.35)
Calcium: 10 mg/dL (ref 8.4–10.5)
GFR calc Af Amer: 73 mL/min — ABNORMAL LOW (ref >90)
GFR calc non Af Amer: 63 mL/min — ABNORMAL LOW (ref >90)
Glucose, Bld: 146 mg/dL — ABNORMAL HIGH (ref 70–99)
Potassium: 4.3 mEq/L (ref 3.7–5.3)
Sodium: 140 mEq/L (ref 137–147)

## 2013-03-29 NOTE — Progress Notes (Signed)
Patient ID: [name] [name], male   DOB: March 18, 1955, 58 y.o.   MRN: [id number] Referring Physician: Dr. Silverio Decamp Primary Care: Internal Medicine clinic, Dr. Silverio Decamp Primary Cardiologist: Dr. Doylene Canard  GI: Dr Ardis Hughs.   HPI: [name] [name] is a 66 y.o. 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   Former cocaine abuse. Had history of acute renal failure in setting of rhabdo requiring short term HD.    Admitted with new onset HF in April 2014.   He was diuresed and discharged home.  Discharge weight 256 pounds. Echo at the time showed EF 123456, grade 1 diastolic dysfunction.      Cath 04/27/12 [facility] RA = 4  RV = 45/3/4  PA = 42/9 (24)  PCW = 14  Fick cardiac output/index = 5.2/2.2  PVR = 1.9 Woods  SVR = 1656  FA sat = 96%  PA sat = 62%, 64% Ao Pressure: 149/84 (11)  LV Pressure: 142/8/29  There was no signficant gradient across the aortic valve on pullback. Arteries normal. LV gram EF= 35-40% global HK  Admitted to [facility] 5/30 through 06/27/12 with GI bleed. EGD 06/25/12 multiple ulcers noted. Continued on protonix.   ECHO 10/27/12 EF 40-45%   He returns for follow up with his sister.  Last visit he was not taking his diue nstructed to take Metolazone 2.5 mg for 3 days. Weight at home trending dwon from 290 to 276 pounds. Denies SOB. + PND.  Sleeps in a chair. Does admit to dyspnea going up steps. Was supposed to have sleep study but says it was cancelled.  Compliant with medications and he brought them to his visit. Not following low salt diet. Drinking > 2 liters of fluid.    Labs 02/12/13 K 4.3 Creatinine 0.98    SH: Quit smoking 9 years ago. No alcohol 7 months ago. Lives alone FH: Mom died from MI in her early 71s  Brother MI   Review of Systems: All pertinent positives and negatives as in HPI, otherwise negative.    Past Medical History  Diagnosis Date  . GERD (gastroesophageal reflux disease)   .  Schizophrenia, schizo-affective   . Cataract   . Hyperlipidemia   . Wears glasses   . Wears dentures     upper  . Hypertension     uncontrolled with medication noncompliance  . Hepatitis C   . HIT (heparin-induced thrombocytopenia)   . Continuous chronic alcoholism   . H/O cocaine abuse     none in 6 months  . Stroke 01/2012    Small cerebellar infarcts right greater than left as well as questionable acute left external capsule and caudate nuclear punctate lacunar infarcts noted per MRI (01/2012) - presumed to be embolic likely source PFO with right to left shunt (noted per TEE 01/ 2014)  . Chronic combined systolic and diastolic CHF (congestive heart failure) 02/29/2012    LV EF 40-45% per 2D echo (02/2012) with grade 1 diastolic dysfunction. 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 Degenerative lumbar spinal stenosis     s/p L2-3, L3-4, L4-5 laminectomy partial facetectomy, and bilateral foraminotomy  . History of pancreatitis 01/2011    Admission for acute pancreatitis presumed 2/2 ongoing alcohol abuse- and hypertriglyceridemia  . PFO (patent foramen ovale) 01/2012    with right to left shunt, noted per TEE in evaluation for  source of embolic stroke in [id number]  . CKD (chronic kidney disease) stage 2, GFR 60-89 ml/min     BL SCr approximately 1-1.3  . 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 Hepatic steatosis     suspected 2/2 alcohol abuse  . Splenic cyst   . Colitis 05/2009    History of colitis of ascending colon noted on CT abd/pelvis (05/2009), with interval resolution with subsequent CT  . Hypertriglyceridemia   . Cancer     Prostate cancer-bx. 3 weeks ago  . History of ETOH abuse 12-13-12     no use in 6 months    Current Outpatient Prescriptions  Medication Sig Dispense Refill  . ARIPiprazole (ABILIFY) 10 MG tablet Take 10 mg by mouth at  bedtime.      . carvedilol (COREG) 12.5 MG tablet Take 18.75 mg by mouth 2 (two) times daily with a meal.      . cloNIDine (CATAPRES) 0.2 MG tablet Take 0.5 tablets (0.1 mg total) by mouth 2 (two) times daily.  30 tablet  4  . CRESTOR 20 MG tablet take 1 tablet by mouth once daily  90 tablet  1  . lisinopril (PRINIVIL,ZESTRIL) 20 MG tablet Take half a tablet (10mg  ) every morning and 1 tablet (20mg ) every evening.      . metFORMIN (GLUCOPHAGE) 500 MG tablet take 1 tablet by mouth twice a day with food  60 tablet  1  . metolazone (ZAROXOLYN) 2.5 MG tablet Take 1 tablet (2.5 mg total) by mouth as directed.  10 tablet  3  . oxyCODONE-acetaminophen (PERCOCET) 7.5-325 MG per tablet Take 1 tablet by mouth 3 (three) times daily.      Marland Kitchen oxymorphone (OPANA ER) 10 MG 12 hr tablet Take 10 mg by mouth every 12 (twelve) hours.      . pantoprazole (PROTONIX) 40 MG tablet Take 40 mg by mouth 2 (two) times daily.       . potassium chloride (K-DUR) 10 MEQ tablet Take 1 tablet (10 mEq total) by mouth 2 (two) times daily. Take extra 2 tabs when take Metolazone  70 tablet  3  . torsemide (DEMADEX) 20 MG tablet Take 2 tablets (40 mg total) by mouth 2 (two) times daily.  120 tablet  6  . traZODone (DESYREL) 100 MG tablet Take 100 mg by mouth at bedtime.       No current facility-administered medications for this encounter.    Allergies  Allergen Reactions  . Heparin Other (See Comments)    Documented HIT under problem list Pt states he's not allergic.  Marland Kitchen Thorazine [Chlorpromazine Hcl] Other (See Comments)    Body freezes up    PHYSICAL EXAM: Filed Vitals:   03/29/13 1017  BP: 110/72  Pulse: 64  Weight: 282 lb (127.914 kg)  SpO2: 97%    General:  NAD. No respiratory difficulty Sister present  HEENT: normal Neck: supple. JVP difficult to see due to body habitus. But does not appear elelvated.    Carotids 2+ bilat; no bruits. No lymphadenopathy or thryomegaly appreciated. Cor: PMI nondisplaced. Regular  rate & rhythm. No rubs, gallops, Very soft TR murmur. Lungs: clear Abdomen: soft, nontender, + distention. No hepatosplenomegaly. No bruits or masses. Good bowel sounds. Extremities: no cyanosis, clubbing, rash, R and LLE trace to 1+  Neuro: alert & oriented x 3, cranial nerves grossly intact. moves all 4 extremities w/o difficulty. Flat Affect   ASSESSMENT Kathyrn Lass  1) Chronic systolic/diastolic HF due to NICM . ECHO 30-35% NYHA III. Volume status iImproved.  Just mildly elevated. Continue torsemide 40 bid. Continue 2.5 mg metolazone 2.5 as needed.  (with KCL 20 extra each time) and whenever weight up 3 pounds.  Would like his weight at home less than 276 pounds at home.   Continue carvedilol 18.7 5 mg twice a day.  Continue lisinopril 10 mg in am and 20 mg in pm  Reinforced  daily weights, low salt food choices, and medication compliance.Provided with weight chart and went over how record dailly weights.  Will refer to paramedicine today. I contacted HF paramedicine during the visit to discuss his case.  Check BMET today.   2) HTN Improved. Would like to stop clonidine and add spiro but will check BMET today if renal funciton will stop clonidine and start 12.5 mg spironolactone. Will also have Paramedicine follow BP.   Follow up next week with labs. Consider Paramedicine program.   Geo Slone NP-C  10:23 AM

## 2013-03-29 NOTE — Patient Instructions (Signed)
Follow up 1 month.  Do the following things EVERYDAY: 1) Weigh yourself in the morning before breakfast. Write it down and keep it in a log. 2) Take your medicines as prescribed 3) Eat low salt foods-Limit salt (sodium) to 2000 mg per day.  4) Stay as active as you can everyday 5) Limit all fluids for the day to less than 2 liters  

## 2013-04-03 ENCOUNTER — Ambulatory Visit (INDEPENDENT_AMBULATORY_CARE_PROVIDER_SITE_OTHER): Payer: PRIVATE HEALTH INSURANCE

## 2013-04-03 ENCOUNTER — Other Ambulatory Visit (INDEPENDENT_AMBULATORY_CARE_PROVIDER_SITE_OTHER): Payer: PRIVATE HEALTH INSURANCE

## 2013-04-03 DIAGNOSIS — E119 Type 2 diabetes mellitus without complications: Secondary | ICD-10-CM

## 2013-04-03 DIAGNOSIS — G5600 Carpal tunnel syndrome, unspecified upper limb: Secondary | ICD-10-CM

## 2013-04-03 DIAGNOSIS — R200 Anesthesia of skin: Secondary | ICD-10-CM

## 2013-04-03 DIAGNOSIS — R209 Unspecified disturbances of skin sensation: Secondary | ICD-10-CM

## 2013-04-03 DIAGNOSIS — E785 Hyperlipidemia, unspecified: Secondary | ICD-10-CM

## 2013-04-03 LAB — LIPID PANEL
CHOLESTEROL: 117 mg/dL (ref 0–200)
HDL: 32 mg/dL — ABNORMAL LOW (ref >39)
LDL Cholesterol: 46 mg/dL (ref 0–99)
TRIGLYCERIDES: 196 mg/dL — AB (ref <150)
Total CHOL/HDL Ratio: 3.7 Ratio
VLDL: 39 mg/dL (ref 0–40)

## 2013-04-03 NOTE — Procedures (Signed)
     HISTORY:  Kennon Semedo is a 58 year old gentleman with a history of diabetes. The patient presents with a one month history of numbness of his left hand. The patient is being evaluated for a possible neuropathy.  NERVE CONDUCTION STUDIES:  Nerve conduction studies were performed on both upper extremities. The distal motor latencies for the median nerves were prolonged bilaterally, more prominent on the left than the right. The motor amplitudes for these nerves were normal bilaterally. The distal motor latencies and motor amplitudes for the ulnar nerves were normal bilaterally. The F wave latencies were normal for the right median nerve, prolonged for the left median nerve, and normal for the ulnar nerves bilaterally. The nerve conduction velocities for the median and ulnar nerves were normal bilaterally. The sensory latencies for the median nerves were prolonged bilaterally, left more so than the right. The ulnar sensory latencies were normal bilaterally.  EMG STUDIES:  EMG evaluation was not performed.  IMPRESSION:  Nerve conduction studies done on both upper extremities shows evidence of bilateral carpal tunnel syndrome of mild severity, slightly more prominent on the left than the right. No other significant abnormalities were seen.  Jill Alexanders MD 04/03/2013 11:18 AM  [facility] [address] [place], [place] [address]  Phone [phone] Fax [phone]

## 2013-04-05 ENCOUNTER — Other Ambulatory Visit: Payer: Self-pay

## 2013-04-05 ENCOUNTER — Emergency Department (HOSPITAL_COMMUNITY): Payer: PRIVATE HEALTH INSURANCE

## 2013-04-05 ENCOUNTER — Encounter (HOSPITAL_COMMUNITY): Payer: Self-pay | Admitting: Emergency Medicine

## 2013-04-05 ENCOUNTER — Emergency Department (HOSPITAL_COMMUNITY)
Admission: EM | Admit: 2013-04-05 | Discharge: 2013-04-05 | Disposition: A | Discharge status: Home / Self Care | Payer: PRIVATE HEALTH INSURANCE | Attending: Emergency Medicine | Admitting: Emergency Medicine

## 2013-04-05 DIAGNOSIS — Z98811 Dental restoration status: Secondary | ICD-10-CM | POA: Insufficient documentation

## 2013-04-05 DIAGNOSIS — E781 Pure hyperglyceridemia: Secondary | ICD-10-CM | POA: Insufficient documentation

## 2013-04-05 DIAGNOSIS — Q211 Atrial septal defect: Secondary | ICD-10-CM | POA: Insufficient documentation

## 2013-04-05 DIAGNOSIS — Z8669 Personal history of other diseases of the nervous system and sense organs: Secondary | ICD-10-CM | POA: Insufficient documentation

## 2013-04-05 DIAGNOSIS — E785 Hyperlipidemia, unspecified: Secondary | ICD-10-CM | POA: Insufficient documentation

## 2013-04-05 DIAGNOSIS — Z8673 Personal history of transient ischemic attack (TIA), and cerebral infarction without residual deficits: Secondary | ICD-10-CM | POA: Insufficient documentation

## 2013-04-05 DIAGNOSIS — Z91199 Patient's noncompliance with other medical treatment and regimen due to unspecified reason: Secondary | ICD-10-CM | POA: Insufficient documentation

## 2013-04-05 DIAGNOSIS — Z9089 Acquired absence of other organs: Secondary | ICD-10-CM | POA: Insufficient documentation

## 2013-04-05 DIAGNOSIS — Z79899 Other long term (current) drug therapy: Secondary | ICD-10-CM | POA: Insufficient documentation

## 2013-04-05 DIAGNOSIS — R05 Cough: Secondary | ICD-10-CM

## 2013-04-05 DIAGNOSIS — R059 Cough, unspecified: Secondary | ICD-10-CM

## 2013-04-05 DIAGNOSIS — R609 Edema, unspecified: Secondary | ICD-10-CM | POA: Insufficient documentation

## 2013-04-05 DIAGNOSIS — K219 Gastro-esophageal reflux disease without esophagitis: Secondary | ICD-10-CM | POA: Insufficient documentation

## 2013-04-05 DIAGNOSIS — Z8739 Personal history of other diseases of the musculoskeletal system and connective tissue: Secondary | ICD-10-CM | POA: Insufficient documentation

## 2013-04-05 DIAGNOSIS — N182 Chronic kidney disease, stage 2 (mild): Secondary | ICD-10-CM | POA: Insufficient documentation

## 2013-04-05 DIAGNOSIS — F259 Schizoaffective disorder, unspecified: Secondary | ICD-10-CM | POA: Insufficient documentation

## 2013-04-05 DIAGNOSIS — Z789 Other specified health status: Secondary | ICD-10-CM | POA: Insufficient documentation

## 2013-04-05 DIAGNOSIS — Z862 Personal history of diseases of the blood and blood-forming organs and certain disorders involving the immune mechanism: Secondary | ICD-10-CM | POA: Insufficient documentation

## 2013-04-05 DIAGNOSIS — I10 Essential (primary) hypertension: Secondary | ICD-10-CM | POA: Insufficient documentation

## 2013-04-05 DIAGNOSIS — I5042 Chronic combined systolic (congestive) and diastolic (congestive) heart failure: Secondary | ICD-10-CM | POA: Insufficient documentation

## 2013-04-05 DIAGNOSIS — Z9119 Patient's noncompliance with other medical treatment and regimen: Secondary | ICD-10-CM | POA: Insufficient documentation

## 2013-04-05 DIAGNOSIS — Z8619 Personal history of other infectious and parasitic diseases: Secondary | ICD-10-CM | POA: Insufficient documentation

## 2013-04-05 DIAGNOSIS — J069 Acute upper respiratory infection, unspecified: Secondary | ICD-10-CM

## 2013-04-05 DIAGNOSIS — Z87891 Personal history of nicotine dependence: Secondary | ICD-10-CM | POA: Insufficient documentation

## 2013-04-05 DIAGNOSIS — I129 Hypertensive chronic kidney disease with stage 1 through stage 4 chronic kidney disease, or unspecified chronic kidney disease: Secondary | ICD-10-CM | POA: Insufficient documentation

## 2013-04-05 DIAGNOSIS — Q2111 Secundum atrial septal defect: Secondary | ICD-10-CM | POA: Insufficient documentation

## 2013-04-05 LAB — COMPREHENSIVE METABOLIC PANEL
ALBUMIN: 3.9 g/dL (ref 3.5–5.2)
ALT: 27 U/L (ref 0–53)
AST: 33 U/L (ref 0–37)
Alkaline Phosphatase: 113 U/L (ref 39–117)
BUN: 14 mg/dL (ref 6–23)
CALCIUM: 9.5 mg/dL (ref 8.4–10.5)
CO2: 26 mEq/L (ref 19–32)
CREATININE: 0.93 mg/dL (ref 0.50–1.35)
Chloride: 103 mEq/L (ref 96–112)
GFR calc Af Amer: >90 mL/min (ref >90)
GFR calc non Af Amer: >90 mL/min (ref >90)
GLUCOSE: 124 mg/dL — AB (ref 70–99)
Potassium: 4.3 mEq/L (ref 3.7–5.3)
Sodium: 142 mEq/L (ref 137–147)
TOTAL PROTEIN: 7.7 g/dL (ref 6.0–8.3)
Total Bilirubin: 0.5 mg/dL (ref 0.3–1.2)

## 2013-04-05 LAB — URINALYSIS, ROUTINE W REFLEX MICROSCOPIC
BILIRUBIN URINE: NEGATIVE
Glucose, UA: NEGATIVE mg/dL
Hgb urine dipstick: NEGATIVE
KETONES UR: NEGATIVE mg/dL
LEUKOCYTES UA: NEGATIVE
NITRITE: NEGATIVE
PH: 6 (ref 5.0–8.0)
Protein, ur: NEGATIVE mg/dL
SPECIFIC GRAVITY, URINE: 1.011 (ref 1.005–1.030)
Urobilinogen, UA: 1 mg/dL (ref 0.0–1.0)

## 2013-04-05 LAB — PRO B NATRIURETIC PEPTIDE: PRO B NATRI PEPTIDE: 141.1 pg/mL — AB (ref 0–125)

## 2013-04-05 LAB — D-DIMER, QUANTITATIVE: D-Dimer, Quant: 0.7 ug/mL-FEU — ABNORMAL HIGH (ref 0.00–0.48)

## 2013-04-05 LAB — CBC
HEMATOCRIT: 43.3 % (ref 39.0–52.0)
HEMOGLOBIN: 14.2 g/dL (ref 13.0–17.0)
MCH: 27.7 pg (ref 26.0–34.0)
MCHC: 32.8 g/dL (ref 30.0–36.0)
MCV: 84.6 fL (ref 78.0–100.0)
Platelets: 170 10*3/uL (ref 150–400)
RBC: 5.12 MIL/uL (ref 4.22–5.81)
RDW: 15.4 % (ref 11.5–15.5)
WBC: 9.1 10*3/uL (ref 4.0–10.5)

## 2013-04-05 MED ORDER — IPRATROPIUM BROMIDE 0.02 % IN SOLN
RESPIRATORY_TRACT | Status: AC
  Filled 2013-04-05: qty 2.5

## 2013-04-05 MED ORDER — DEXTROMETHORPHAN POLISTIREX 30 MG/5ML PO LQCR
30.0000 mg | Freq: Once | ORAL | Status: AC
  Administered 2013-04-05: 30 mg via ORAL
  Filled 2013-04-05: qty 5

## 2013-04-05 MED ORDER — AEROCHAMBER Z-STAT PLUS/MEDIUM MISC
1.0000 | Freq: Once | Status: AC
  Administered 2013-04-05: 1

## 2013-04-05 MED ORDER — IPRATROPIUM BROMIDE 0.02 % IN SOLN
0.5000 mg | Freq: Once | RESPIRATORY_TRACT | Status: AC
  Administered 2013-04-05: 0.5 mg via RESPIRATORY_TRACT
  Filled 2013-04-05: qty 2.5

## 2013-04-05 MED ORDER — FUROSEMIDE 10 MG/ML IJ SOLN
60.0000 mg | Freq: Once | INTRAMUSCULAR | Status: AC
  Administered 2013-04-05: 60 mg via INTRAVENOUS
  Filled 2013-04-05: qty 6

## 2013-04-05 MED ORDER — ALBUTEROL SULFATE (2.5 MG/3ML) 0.083% IN NEBU
5.0000 mg | INHALATION_SOLUTION | Freq: Once | RESPIRATORY_TRACT | Status: AC
  Administered 2013-04-05: 5 mg via RESPIRATORY_TRACT
  Filled 2013-04-05: qty 6

## 2013-04-05 MED ORDER — ALBUTEROL SULFATE HFA 108 (90 BASE) MCG/ACT IN AERS
2.0000 | INHALATION_SPRAY | RESPIRATORY_TRACT | Status: DC | PRN
  Administered 2013-04-05: 2 via RESPIRATORY_TRACT
  Filled 2013-04-05: qty 6.7

## 2013-04-05 MED ORDER — ALBUTEROL SULFATE (2.5 MG/3ML) 0.083% IN NEBU
INHALATION_SOLUTION | RESPIRATORY_TRACT | Status: AC
  Filled 2013-04-05: qty 3

## 2013-04-05 MED ORDER — IOHEXOL 350 MG/ML SOLN
100.0000 mL | Freq: Once | INTRAVENOUS | Status: AC | PRN
  Administered 2013-04-05: 100 mL via INTRAVENOUS

## 2013-04-05 MED ORDER — AEROCHAMBER PLUS W/MASK MISC
Status: AC
  Filled 2013-04-05: qty 1

## 2013-04-05 MED ORDER — POTASSIUM CHLORIDE CRYS ER 20 MEQ PO TBCR
40.0000 meq | EXTENDED_RELEASE_TABLET | Freq: Once | ORAL | Status: AC
  Administered 2013-04-05: 40 meq via ORAL
  Filled 2013-04-05: qty 2

## 2013-04-05 NOTE — ED Notes (Signed)
Pt taken to CT.

## 2013-04-05 NOTE — Discharge Instructions (Signed)
Use a cough medicine with dextromethorphan, 3 or 4 times a day as needed for cough. Use the inhaler, 2 puffs every 3 or 4 hours if needed for cough or trouble breathing.    Cough, Adult  A cough is a reflex that helps clear your throat and airways. It can help heal the body or may be a reaction to an irritated airway. A cough may only last 2 or 3 weeks (acute) or may last more than 8 weeks (chronic).  CAUSES Acute cough:  Viral or bacterial infections. Chronic cough:  Infections.  Allergies.  Asthma.  Post-nasal drip.  Smoking.  Heartburn or acid reflux.  Some medicines.  Chronic lung problems (COPD).  Cancer. SYMPTOMS   Cough.  Fever.  Chest pain.  Increased breathing rate.  High-pitched whistling sound when breathing (wheezing).  Colored mucus that you cough up (sputum). TREATMENT   A bacterial cough may be treated with antibiotic medicine.  A viral cough must run its course and will not respond to antibiotics.  Your caregiver may recommend other treatments if you have a chronic cough. HOME CARE INSTRUCTIONS   Only take over-the-counter or prescription medicines for pain, discomfort, or fever as directed by your caregiver. Use cough suppressants only as directed by your caregiver.  Use a cold steam vaporizer or humidifier in your bedroom or home to help loosen secretions.  Sleep in a semi-upright position if your cough is worse at night.  Rest as needed.  Stop smoking if you smoke. SEEK IMMEDIATE MEDICAL CARE IF:   You have pus in your sputum.  Your cough starts to worsen.  You cannot control your cough with suppressants and are losing sleep.  You begin coughing up blood.  You have difficulty breathing.  You develop pain which is getting worse or is uncontrolled with medicine.  You have a fever. MAKE SURE YOU:   Understand these instructions.  Will watch your condition.  Will get help right away if you are not doing well or get  worse. Document Released: 07/11/2010 Document Revised: 04/06/2011 Document Reviewed: 07/11/2010 [facility] Patient Information 2014 [place].

## 2013-04-05 NOTE — ED Provider Notes (Signed)
CSN: [id number]     Arrival date & time 04/05/13  [id number] History   First MD Initiated Contact with Patient 04/05/13 0744     Chief Complaint  Patient presents with  . Chest Pain     (Consider location/radiation/quality/duration/timing/severity/associated sxs/prior Treatment) Patient is a 58 y.o. male presenting with chest pain. The history is provided by the patient.  Chest Pain  He complains of ongoing cough for several days; that is productive of yellow sputum. He denies fever, chills, nausea or vomiting. He has chest discomfort that occurs only with coughing. He does not have ongoing chest pain. He has tried over-the-counter medicines and his usual medicines, without, relief. He sees his cardiologist regularly. He has been eating well. He denies weakness, dizziness, headache, or back pain. There are no other known modifying factors.    Past Medical History  Diagnosis Date  . GERD (gastroesophageal reflux disease)   . Schizophrenia, schizo-affective   . Cataract   . Hyperlipidemia   . Wears glasses   . Wears dentures     upper  . Hypertension     uncontrolled with medication noncompliance  . Hepatitis C   . HIT (heparin-induced thrombocytopenia)   . Continuous chronic alcoholism   . H/O cocaine abuse     none in 6 months  . Stroke 01/2012    Small cerebellar infarcts right greater than left as well as questionable acute left external capsule and caudate nuclear punctate lacunar infarcts noted per MRI (01/2012) - presumed to be embolic likely source PFO with right to left shunt (noted per TEE 01/ 2014)  . Chronic combined systolic and diastolic CHF (congestive heart failure) 02/29/2012    LV EF 40-45% per 2D echo (02/2012) with grade 1 diastolic dysfunction. 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 Degenerative lumbar spinal stenosis     s/p L2-3, L3-4, L4-5 laminectomy partial facetectomy, and bilateral foraminotomy   . History of pancreatitis 01/2011    Admission for acute pancreatitis presumed 2/2 ongoing alcohol abuse- and hypertriglyceridemia  . PFO (patent foramen ovale) 01/2012    with right to left shunt, noted per TEE in evaluation for source of embolic stroke in [id number]  . CKD (chronic kidney disease) stage 2, GFR 60-89 ml/min     BL SCr approximately 1-1.3  . 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 Hepatic steatosis     suspected 2/2 alcohol abuse  . Splenic cyst   . Colitis 05/2009    History of colitis of ascending colon noted on CT abd/pelvis (05/2009), with interval resolution with subsequent CT  . Hypertriglyceridemia   . Cancer     Prostate cancer-bx. 3 weeks ago  . History of ETOH abuse 12-13-12     no use in 6 months   Past Surgical History  Procedure Laterality Date  . L2-3, l3-4, l4-5 laminectomy, partial facetectomy    . Left achilles Right 2007  . Tonsillectomy    . Knee arthroscopy    . I&d extremity  03/20/2011    Procedure: IRRIGATION AND DEBRIDEMENT EXTREMITY;  Surgeon: Kerin Salen, MD;  Location: [facility];  Service: Orthopedics;  Laterality: Left;  I&D LEFT ACHILLIES TENDON  . Eye surgery      right eye  . Resection distal clavical  09/17/2011    Procedure: RESECTION DISTAL CLAVICAL;  Surgeon: Nita Sells, MD;  Location: [facility]  [place];  Service: Orthopedics;  Laterality: Right;  right shoulder arthroscopy with sad and open distal clavicle excision   . Esophagogastroduodenoscopy N/A 06/25/2012    Procedure: ESOPHAGOGASTRODUODENOSCOPY (EGD);  Surgeon: Milus Banister, MD;  Location: [place];  Service: Endoscopy;  Laterality: N/A;  . Robot assisted laparoscopic radical prostatectomy N/A 12/16/2012    Procedure: ROBOTIC ASSISTED LAPAROSCOPIC PROSTATECTOMY ;  Surgeon: Ardis Hughs, MD;  Location: [facility];  Service: Urology;  Laterality: N/A;   Family History  Problem Relation  Age of Onset  . CAD Mother 81  . CAD Sister   . CAD Brother 10    died from MI at age 79yo  . Hypertension     History  Substance Use Topics  . Smoking status: Former Smoker -- 0.50 packs/day for 30 years    Types: Cigarettes    Quit date: 06/24/2001  . Smokeless tobacco: Not on file  . Alcohol Use: No     Comment: Quit about several months ago.    Review of Systems  Cardiovascular: Positive for chest pain.  All other systems reviewed and are negative.      Allergies  Heparin and Thorazine  Home Medications   Current Outpatient Rx  Name  Route  Sig  Dispense  Refill  . ARIPiprazole (ABILIFY) 10 MG tablet   Oral   Take 10 mg by mouth at bedtime.         . carvedilol (COREG) 12.5 MG tablet   Oral   Take 18.75 mg by mouth 2 (two) times daily with a meal.         . cloNIDine (CATAPRES) 0.2 MG tablet   Oral   Take 0.5 tablets (0.1 mg total) by mouth 2 (two) times daily.   30 tablet   4   . CRESTOR 20 MG tablet      take 1 tablet by mouth once daily   90 tablet   1   . lisinopril (PRINIVIL,ZESTRIL) 20 MG tablet      Take half a tablet (10mg  ) every morning and 1 tablet (20mg ) every evening.         . metFORMIN (GLUCOPHAGE) 500 MG tablet      take 1 tablet by mouth twice a day with food   60 tablet   1   . oxyCODONE-acetaminophen (PERCOCET) 7.5-325 MG per tablet   Oral   Take 1 tablet by mouth 3 (three) times daily.         Marland Kitchen oxymorphone (OPANA ER) 10 MG 12 hr tablet   Oral   Take 10 mg by mouth every 12 (twelve) hours.         . pantoprazole (PROTONIX) 40 MG tablet   Oral   Take 40 mg by mouth 2 (two) times daily.          . potassium chloride (K-DUR) 10 MEQ tablet   Oral   Take 1 tablet (10 mEq total) by mouth 2 (two) times daily. Take extra 2 tabs when take Metolazone   70 tablet   3   . torsemide (DEMADEX) 20 MG tablet   Oral   Take 2 tablets (40 mg total) by mouth 2 (two) times daily.   120 tablet   6   . traZODone  (DESYREL) 100 MG tablet   Oral   Take 100 mg by mouth at bedtime.          BP 139/76  Pulse 72  Temp(Src) 98.4 F (36.9  C) (Oral)  Resp 24  Ht 5\' 11"  (1.803 m)  Wt 276 lb 12.8 oz (125.556 kg)  BMI 38.62 kg/m2  SpO2 100% Physical Exam  Nursing note and vitals reviewed. Constitutional: He is oriented to person, place, and time. He appears well-developed and well-nourished.  HENT:  Head: Normocephalic and atraumatic.  Right Ear: External ear normal.  Left Ear: External ear normal.  Eyes: Conjunctivae and EOM are normal. Pupils are equal, round, and reactive to light.  Neck: Normal range of motion and phonation normal. Neck supple.  Cardiovascular: Normal rate, regular rhythm, normal heart sounds and intact distal pulses.   Pulmonary/Chest: Effort normal and breath sounds normal. No respiratory distress. He has no wheezes. He exhibits no tenderness and no bony tenderness.  Abdominal: Soft. Normal appearance. There is no tenderness.  Musculoskeletal: Normal range of motion. He exhibits edema (bilateral 2-3+).  Asymmetry is present. Right greater than left. There is no tenderness to palpation of the calf.  Neurological: He is alert and oriented to person, place, and time. No cranial nerve deficit or sensory deficit. He exhibits normal muscle tone. Coordination normal.  Skin: Skin is warm, dry and intact.  Psychiatric: He has a normal mood and affect. His behavior is normal. Judgment and thought content normal.    ED Course  Procedures (including critical care time)  Medications  albuterol (PROVENTIL HFA;VENTOLIN HFA) 108 (90 BASE) MCG/ACT inhaler 2 puff (2 puffs Inhalation Given 04/05/13 1257)  aerochamber plus with mask device (not administered)  furosemide (LASIX) injection 60 mg (60 mg Intravenous Given 04/05/13 0903)  potassium chloride SA (K-DUR,KLOR-CON) CR tablet 40 mEq (40 mEq Oral Given 04/05/13 0903)  dextromethorphan (DELSYM) 30 MG/5ML liquid 30 mg (30 mg Oral Given  04/05/13 1048)  albuterol (PROVENTIL) (2.5 MG/3ML) 0.083% nebulizer solution 5 mg ( Nebulization Not Given 04/05/13 1150)  ipratropium (ATROVENT) nebulizer solution 0.5 mg ( Nebulization Not Given 04/05/13 1150)  iohexol (OMNIPAQUE) 350 MG/ML injection 100 mL (100 mLs Intravenous Contrast Given 04/05/13 1129)  aerochamber Z-Stat Plus/medium 1 each (1 each Other Given 04/05/13 1256)    Patient Vitals for the past 24 hrs:  BP Temp Temp src Pulse Resp SpO2 Height Weight  04/05/13 1200 139/76 mmHg - - 72 24 100 % - -  04/05/13 1045 118/73 mmHg - - 68 28 97 % - -  04/05/13 1030 141/82 mmHg - - 67 41 96 % - -  04/05/13 1015 145/74 mmHg - - 72 22 96 % - -  04/05/13 1000 174/97 mmHg - - 80 15 97 % - -  04/05/13 0945 138/72 mmHg - - 67 21 97 % - -  04/05/13 0930 132/76 mmHg - - 63 18 95 % - -  04/05/13 0911 - - - - - - 5\' 11"  (1.803 m) 276 lb 12.8 oz (125.556 kg)  04/05/13 0900 142/73 mmHg - - 67 29 95 % - -  04/05/13 0800 137/66 mmHg - - - 15 - - -  04/05/13 0725 157/84 mmHg 98.4 F (36.9 C) Oral 76 20 92 % 5\' 9"  (1.753 m) 282 lb (127.914 kg)   Filed Weights   04/05/13 0725 04/05/13 0911  Weight: 282 lb (127.914 kg) 276 lb 12.8 oz (125.556 kg)    1230- Reevaluation with update and discussion. After initial assessment and treatment, an updated evaluation reveals he continues to feel better, now, after nebulizer. He, again reiterated that he has chronic right greater than left lower leg swelling. He, states it was evaluated  in the past for cough, with a Doppler. Haadi Santellan L   Labs Review Labs Reviewed  COMPREHENSIVE METABOLIC PANEL - Abnormal; Notable for the following:    Glucose, Bld 124 (*)    All other components within normal limits  PRO B NATRIURETIC PEPTIDE - Abnormal; Notable for the following:    Pro B Natriuretic peptide (BNP) 141.1 (*)    All other components within normal limits  D-DIMER, QUANTITATIVE - Abnormal; Notable for the following:    D-Dimer, Quant 0.70 (*)    All  other components within normal limits  CBC  URINALYSIS, ROUTINE W REFLEX MICROSCOPIC   Imaging Review Ct Angio Chest Pe W/cm &/or Wo Cm  04/05/2013   CLINICAL DATA Patient complains of midline upper chest pain and shortness of breath with nonproductive cough, symptoms improved with breathing treatment  EXAM CT ANGIOGRAPHY CHEST WITH CONTRAST  TECHNIQUE Multidetector CT imaging of the chest was performed using the standard protocol during bolus administration of intravenous contrast. Multiplanar CT image reconstructions and MIPs were obtained to evaluate the vascular anatomy.  CONTRAST 152mL OMNIPAQUE IOHEXOL 350 MG/ML SOLN  COMPARISON DG CHEST 1V PORT dated 04/05/2013; DG CHEST 2 VIEW dated 02/12/2013; CT ABD/PELVIS W CM dated 12/19/2012  FINDINGS Evaluation of the pulmonary arterial system is limited by presence of contrast within the arterial systemic system at the time images were acquired. There is also motion artifact compromising evaluation of the lower lung zones. Overall there is mild to moderate limitation, with the third order and more peripheral branches of the pulmonary arterial system not evaluated well. The central pulmonary arteries, main pulmonary artery, and proximal perihilar branches show no emboli. There is bilateral bronchitic change with bronchial wall thickening in the perihilar areas. There is no pleural effusion. There is no significant mediastinal adenopathy. There is a nonpathologic right hilar lymph node. There is no infiltrate or consolidation.  There is a large low-attenuation splenic lesion measuring 6.2 cm in greatest diameter. It demonstrates some internal calcification. A few smaller satellite lesions are again identified. There is fatty infiltration of the liver. There is some dense material within the gastric lumen. This could represent oral contrast or high density non medical ingested material. There are no acute musculoskeletal findings.  Review of the MIP images confirms  the above findings.  IMPRESSION 1. No evidence of pulmonary arterial embolism. There is mild bilateral bronchitic change. 2. Multiple partially visualized splenic lesions appears stable from prior studies. Majority of splenic lesions are benign.  SIGNATURE  Electronically Signed   By: Skipper Cliche M.D.   On: 04/05/2013 12:03   Dg Chest Portable 1 View  04/05/2013   CLINICAL DATA Chest pain  EXAM PORTABLE CHEST - 1 VIEW  COMPARISON DG CHEST 2 VIEW dated 02/12/2013; DG CHEST 2 VIEW dated 10/16/2012; DG CHEST 2 VIEW dated 07/14/2012  FINDINGS Mild cardiac enlargement stable. Vascular pattern normal. Lungs clear.  IMPRESSION No active disease.  SIGNATURE  Electronically Signed   By: Skipper Cliche M.D.   On: 04/05/2013 08:50     EKG Interpretation   Date/Time:  Wednesday April 05 2013 09:17:36 EDT Ventricular Rate:  70 PR Interval:  188 QRS Duration: 110 QT Interval:  411 QTC Calculation: 443 R Axis:   118 Text Interpretation:  Sinus rhythm Left posterior fascicular block  Abnormal T, consider ischemia, lateral leads Baseline wander in lead(s) V5  since last tracing no significant change Confirmed by Eulis Foster  MD, Arn Mcomber  [id number]) on 04/05/2013 11:54:08 AM  MDM   Final diagnoses:  URI (upper respiratory infection)  Cough    Multi-factorial, dyspnea, with nonspecific chest discomfort. He likely has bronchitis related to a URI. There is no evidence for congestive heart failure. His weight is less than several recent levels, indicating that he is actually somewhat underweight. Doubt ACS, PE, pneumonia, or metabolic instability.  Nursing Notes Reviewed/ Care Coordinated Applicable Imaging Reviewed Interpretation of Laboratory Data incorporated into ED treatment  The patient appears reasonably screened and/or stabilized for discharge and I doubt any other medical condition or other [facility] requiring further screening, evaluation, or treatment in the ED at this time prior to  discharge.  Plan: Home Medications- albuterol; Home Treatments- rest; return here if the recommended treatment, does not improve the symptoms; Recommended follow up- PCP, for check up one week    Richarda Blade, MD 04/05/13 1554

## 2013-04-05 NOTE — ED Notes (Addendum)
Pt c/o chest pain and chest congestion.  Pt states it started 2 days ago.  Also c/o nasal congestion.  Pt describes chest pain as dull located center of chest, does not radiate.  Pt c/o nausea, diaphoresis, and lightheadedness, and generalized soreness. Pt states he took robitussin this morning around 4:30am.  Pt states he is having a productive cough with clear, thin sputum.  Pt states he has difficulty breathing, especially on expiration. Pt hx of hypertension, denies hx of respiratory problems.  Pt states he is supposed to get tested for sleep apnea.  No acute distress noted, skin warm and dry, respirations equal, mildly labored.

## 2013-04-05 NOTE — ED Notes (Signed)
Pt reports chest pain and SOB over the past couple of days. States that he has also had a cough and congestion. Hx of CHF. Increased SOB with activity.

## 2013-04-05 NOTE — ED Notes (Signed)
Portable xray at bedside.

## 2013-04-05 NOTE — ED Notes (Signed)
Pt states his breathing has improved since administering lasix, but pt c/o a cough

## 2013-04-11 ENCOUNTER — Encounter: Payer: Self-pay | Admitting: Internal Medicine

## 2013-04-11 ENCOUNTER — Ambulatory Visit (INDEPENDENT_AMBULATORY_CARE_PROVIDER_SITE_OTHER): Payer: PRIVATE HEALTH INSURANCE | Admitting: Internal Medicine

## 2013-04-11 VITALS — BP 111/72 | HR 66 | Temp 98.3°F | Ht 71.0 in | Wt 284.7 lb

## 2013-04-11 DIAGNOSIS — E119 Type 2 diabetes mellitus without complications: Secondary | ICD-10-CM

## 2013-04-11 DIAGNOSIS — R200 Anesthesia of skin: Secondary | ICD-10-CM

## 2013-04-11 DIAGNOSIS — J209 Acute bronchitis, unspecified: Secondary | ICD-10-CM | POA: Insufficient documentation

## 2013-04-11 DIAGNOSIS — G5600 Carpal tunnel syndrome, unspecified upper limb: Secondary | ICD-10-CM | POA: Insufficient documentation

## 2013-04-11 LAB — GLUCOSE, CAPILLARY: GLUCOSE-CAPILLARY: 147 mg/dL — AB (ref 70–99)

## 2013-04-11 MED ORDER — BENZONATATE 100 MG PO CAPS: 100.0000 mg | ORAL_CAPSULE | Freq: Three times a day (TID) | ORAL | Status: DC

## 2013-04-11 MED ORDER — WRIST SPLINT LEFT/RIGHT MISC: 1.0000 | Freq: Every evening | Status: DC

## 2013-04-11 MED ORDER — WRIST SPLINT/RIGHT LARGE MISC: 1.0000 | Freq: Every evening | Status: DC

## 2013-04-11 NOTE — Progress Notes (Signed)
   Subjective:    Patient ID: [name] [name], male    DOB: [birth year], 58 y.o.   MRN: [id number]  HPI  Presents for post-ED visit after evaluation for bronchitis secondary to URI.  States that his shortness of breath and congestion is improving but requesting something for cough. States that chest pain "wasnt really the problem".  Has tried Robitussin which has not worked to resolve his cough.   Pt would also like results of his nerve conduction study of his wrists which demonstrated bilateral carpal tunnel syndrome.  Review of Systems  HENT: Positive for congestion and rhinorrhea.        Congestion and "runny nose not bad now"  Eyes: Negative.   Respiratory: Positive for cough. Negative for shortness of breath.   Cardiovascular: Negative for chest pain.  Gastrointestinal: Negative.   Genitourinary: Negative.   Musculoskeletal: Negative.   Skin: Negative.   Neurological:       Numbness of both hands, recently evaluated by Neurology  Hematological: Negative.   Psychiatric/Behavioral: Negative.        Objective:   Physical Exam  Constitutional: He is oriented to person, place, and time. He appears well-developed and well-nourished. No distress.  HENT:  Head: Normocephalic and atraumatic.  Eyes: Conjunctivae and EOM are normal. Pupils are equal, round, and reactive to light.  Neck: Normal range of motion. Neck supple. No thyromegaly present.  Cardiovascular: Normal rate, regular rhythm and normal heart sounds.   Pulmonary/Chest: Effort normal and breath sounds normal. He has no wheezes.  Abdominal: Soft. Bowel sounds are normal.  obese  Neurological: He is alert and oriented to person, place, and time.  Skin: Skin is warm and dry.  Psychiatric: He has a normal mood and affect.          Assessment & Plan:  See problem-list charting:

## 2013-04-11 NOTE — Patient Instructions (Signed)
Wear wrist splints while you sleep for your carpal tunnel. We will send you to Occupational Therapy. They work with people with carpal tunnel.  You may have the cough from bronchitis for several more weeks. We sent in a medicine for cough.  It works well, but may not be covered by [employer].  Please bring your medicines with you each time you come.   Medicines may be  Eye drops  Herbal   Vitamins  Pills  Seeing these help [place] take care of you.       Carpal Tunnel Syndrome The carpal tunnel is an area under the skin of the palm of your hand. Nerves, blood vessels, and strong tissues (tendons) pass through the tunnel. The tunnel can become puffy (swollen). If this happens, a nerve can be pinched in the wrist. This causes carpal tunnel syndrome.  HOME CARE  Take all medicine as told by your doctor.  If you were given a splint, wear it as told. Wear it at night or at times when your doctor told you to.  Rest your wrist from the activity that causes your pain.  Put ice on your wrist after long periods of wrist activity.  Put ice in a plastic bag.  Place a towel between your skin and the bag.  Leave the ice on for 15-20 minutes, 03-04 times a day.  Keep all doctor visits as told. GET HELP RIGHT AWAY IF:  You have new problems you cannot explain.  Your problems get worse and medicine does not help. MAKE SURE YOU:   Understand these instructions.  Will watch your condition.  Will get help right away if you are not doing well or get worse. Document Released: 01/01/2011 Document Revised: 04/06/2011 Document Reviewed: 01/01/2011 [facility] Patient Information 2014 [place], [place].  Acute Bronchitis Bronchitis is when the airways that extend from the windpipe into the lungs get red, puffy, and painful (inflamed). Bronchitis often causes thick spit (mucus) to develop. This leads to a cough. A cough is the most common symptom of bronchitis. In acute bronchitis, the  condition usually begins suddenly and goes away over time (usually in 2 weeks). Smoking, allergies, and asthma can make bronchitis worse. Repeated episodes of bronchitis may cause more lung problems. HOME CARE  Rest.  Drink enough fluids to keep your pee (urine) clear or pale yellow (unless you need to limit fluids as told by your doctor).  Only take over-the-counter or prescription medicines as told by your doctor.  Avoid smoking and secondhand smoke. These can make bronchitis worse. If you are a smoker, think about using nicotine gum or skin patches. Quitting smoking will help your lungs heal faster.  Reduce the chance of getting bronchitis again by:  Washing your hands often.  Avoiding people with cold symptoms.  Trying not to touch your hands to your mouth, nose, or eyes.  Follow up with your doctor as told. GET HELP IF: Your symptoms do not improve after 1 week of treatment. Symptoms include:  Cough.  Fever.  Coughing up thick spit.  Body aches.  Chest congestion.  Chills.  Shortness of breath.  Sore throat. GET HELP RIGHT AWAY IF:   You have an increased fever.  You have chills.  You have severe shortness of breath.  You have bloody thick spit (sputum).  You throw up (vomit) often.  You lose too much body fluid (dehydration).  You have a severe headache.  You faint. MAKE SURE YOU:   Understand these instructions.  Will watch your condition.  Will get help right away if you are not doing well or get worse. Document Released: 07/01/2007 Document Revised: 09/14/2012 Document Reviewed: 07/05/2012 [facility] Patient Information 2014 [place].  Benzonatate capsules What is this medicine? BENZONATATE (ben ZOE na tate) is used to treat cough. This medicine may be used for other purposes; ask your health care provider or pharmacist if you have questions. COMMON BRAND NAME(S): Tessalon Perles, Zonatuss  What should I tell my health care provider  before I take this medicine? They need to know if you have any of these conditions: -kidney or liver disease -an unusual or allergic reaction to benzonatate, anesthetics, other medicines, foods, dyes, or preservatives -pregnant or trying to get pregnant -breast-feeding How should I use this medicine? Take this medicine by mouth with a glass of water. Follow the directions on the prescription label. Avoid breaking, chewing, or sucking the capsule, as this can cause serious side effects. Take your medicine at regular intervals. Do not take your medicine more often than directed. Talk to your pediatrician regarding the use of this medicine in children. While this drug may be prescribed for children as young as 53 years old for selected conditions, precautions do apply. Overdosage: If you think you have taken too much of this medicine contact a poison control center or emergency room at once. NOTE: This medicine is only for you. Do not share this medicine with others. What if I miss a dose? If you miss a dose, take it as soon as you can. If it is almost time for your next dose, take only that dose. Do not take double or extra doses. What may interact with this medicine? Do not take this medicine with any of the following medications: -MAOIs like Carbex, Eldepryl, Marplan, Nardil, and Parnate This list may not describe all possible interactions. Give your health care provider a list of all the medicines, herbs, non-prescription drugs, or dietary supplements you use. Also tell them if you smoke, drink alcohol, or use illegal drugs. Some items may interact with your medicine. What should I watch for while using this medicine? Tell your doctor if your symptoms do not improve or if they get worse. If you have a high fever, skin rash, or headache, see your health care professional. You may get drowsy or dizzy. Do not drive, use machinery, or do anything that needs mental alertness until you know how this  medicine affects you. Do not sit or stand up quickly, especially if you are an older patient. This reduces the risk of dizzy or fainting spells. What side effects may I notice from receiving this medicine? Side effects that you should report to your doctor or health care professional as soon as possible: -allergic reactions like skin rash, itching or hives, swelling of the face, lips, or tongue -breathing problems -chest pain -confusion or hallucinations -irregular heartbeat -numbness of mouth or throat -seizures Side effects that usually do not require medical attention (report to your doctor or health care professional if they continue or are bothersome): -burning feeling in the eyes -constipation -headache -nasal congestion -stomach upset This list may not describe all possible side effects. Call your doctor for medical advice about side effects. You may report side effects to FDA at 1-800-FDA-1088. Where should I keep my medicine? Keep out of the reach of children. Store at room temperature between 15 and 30 degrees C (59 and 86 degrees F). Keep tightly closed. Protect from light and moisture. Throw away  any unused medicine after the expiration date. NOTE: This sheet is a summary. It may not cover all possible information. If you have questions about this medicine, talk to your doctor, pharmacist, or health care provider.  2014, [employer] Standard. (2007-04-13 14:52:56)

## 2013-04-11 NOTE — Assessment & Plan Note (Signed)
Evaluated in ED, resolving but with continue cough x 3weeks  -Tessalon perles -cont albuterol IH

## 2013-04-13 NOTE — Progress Notes (Signed)
Case discussed with Dr. Michail Sermon soon after the resident saw the patient.  We reviewed the resident's history and exam and pertinent patient test results.  I agree with the assessment, diagnosis and plan of care documented in the resident's note.

## 2013-04-13 NOTE — Assessment & Plan Note (Addendum)
Nerve conduction studies indicative of bilateral carpal tunnel L > R. Patient does not work. -Will prescribe bilateral wrist splints to be worn in the neutral or slightly extended position nighttly while sleeping. -Encourage weight loss. -Will refer to OT.

## 2013-04-26 ENCOUNTER — Encounter (HOSPITAL_COMMUNITY): Payer: PRIVATE HEALTH INSURANCE

## 2013-05-01 ENCOUNTER — Inpatient Hospital Stay (HOSPITAL_COMMUNITY): Admission: RE | Admit: 2013-05-01 | Payer: PRIVATE HEALTH INSURANCE | Source: Ambulatory Visit

## 2013-05-01 ENCOUNTER — Encounter (HOSPITAL_COMMUNITY): Payer: Self-pay

## 2013-05-03 ENCOUNTER — Ambulatory Visit: Payer: PRIVATE HEALTH INSURANCE | Attending: Internal Medicine

## 2013-05-03 NOTE — Addendum Note (Signed)
Addended by: Hulan Fray on: 05/03/2013 07:08 PM   Modules accepted: Orders

## 2013-05-09 ENCOUNTER — Inpatient Hospital Stay (HOSPITAL_COMMUNITY)
Admission: AD | Admit: 2013-05-09 | Discharge: 2013-05-15 | DRG: 638 | Disposition: A | Discharge status: Home / Self Care | Payer: PRIVATE HEALTH INSURANCE | Source: Ambulatory Visit | Attending: Internal Medicine | Admitting: Internal Medicine

## 2013-05-09 ENCOUNTER — Encounter (HOSPITAL_COMMUNITY): Payer: Self-pay

## 2013-05-09 ENCOUNTER — Telehealth: Payer: Self-pay | Admitting: *Deleted

## 2013-05-09 ENCOUNTER — Encounter: Payer: Self-pay | Admitting: Internal Medicine

## 2013-05-09 ENCOUNTER — Ambulatory Visit (INDEPENDENT_AMBULATORY_CARE_PROVIDER_SITE_OTHER): Payer: PRIVATE HEALTH INSURANCE | Admitting: Internal Medicine

## 2013-05-09 ENCOUNTER — Inpatient Hospital Stay (HOSPITAL_COMMUNITY): Payer: PRIVATE HEALTH INSURANCE

## 2013-05-09 VITALS — BP 124/89 | HR 99 | Temp 98.6°F | Ht 71.0 in | Wt 266.2 lb

## 2013-05-09 DIAGNOSIS — F329 Major depressive disorder, single episode, unspecified: Secondary | ICD-10-CM | POA: Diagnosis present

## 2013-05-09 DIAGNOSIS — F1410 Cocaine abuse, uncomplicated: Secondary | ICD-10-CM | POA: Diagnosis present

## 2013-05-09 DIAGNOSIS — Q211 Atrial septal defect: Secondary | ICD-10-CM

## 2013-05-09 DIAGNOSIS — I959 Hypotension, unspecified: Secondary | ICD-10-CM | POA: Diagnosis present

## 2013-05-09 DIAGNOSIS — B1920 Unspecified viral hepatitis C without hepatic coma: Secondary | ICD-10-CM | POA: Diagnosis present

## 2013-05-09 DIAGNOSIS — H269 Unspecified cataract: Secondary | ICD-10-CM | POA: Diagnosis present

## 2013-05-09 DIAGNOSIS — E1110 Type 2 diabetes mellitus with ketoacidosis without coma: Secondary | ICD-10-CM | POA: Diagnosis present

## 2013-05-09 DIAGNOSIS — K219 Gastro-esophageal reflux disease without esophagitis: Secondary | ICD-10-CM | POA: Diagnosis present

## 2013-05-09 DIAGNOSIS — E1310 Other specified diabetes mellitus with ketoacidosis without coma: Principal | ICD-10-CM | POA: Diagnosis present

## 2013-05-09 DIAGNOSIS — R739 Hyperglycemia, unspecified: Secondary | ICD-10-CM | POA: Insufficient documentation

## 2013-05-09 DIAGNOSIS — Z794 Long term (current) use of insulin: Secondary | ICD-10-CM

## 2013-05-09 DIAGNOSIS — F209 Schizophrenia, unspecified: Secondary | ICD-10-CM

## 2013-05-09 DIAGNOSIS — R0902 Hypoxemia: Secondary | ICD-10-CM | POA: Diagnosis not present

## 2013-05-09 DIAGNOSIS — E861 Hypovolemia: Secondary | ICD-10-CM

## 2013-05-09 DIAGNOSIS — H538 Other visual disturbances: Secondary | ICD-10-CM | POA: Diagnosis present

## 2013-05-09 DIAGNOSIS — E785 Hyperlipidemia, unspecified: Secondary | ICD-10-CM | POA: Diagnosis present

## 2013-05-09 DIAGNOSIS — R5381 Other malaise: Secondary | ICD-10-CM | POA: Diagnosis present

## 2013-05-09 DIAGNOSIS — I129 Hypertensive chronic kidney disease with stage 1 through stage 4 chronic kidney disease, or unspecified chronic kidney disease: Secondary | ICD-10-CM

## 2013-05-09 DIAGNOSIS — Z9119 Patient's noncompliance with other medical treatment and regimen: Secondary | ICD-10-CM

## 2013-05-09 DIAGNOSIS — I5042 Chronic combined systolic (congestive) and diastolic (congestive) heart failure: Secondary | ICD-10-CM | POA: Diagnosis present

## 2013-05-09 DIAGNOSIS — I10 Essential (primary) hypertension: Secondary | ICD-10-CM

## 2013-05-09 DIAGNOSIS — I509 Heart failure, unspecified: Secondary | ICD-10-CM | POA: Diagnosis present

## 2013-05-09 DIAGNOSIS — N182 Chronic kidney disease, stage 2 (mild): Secondary | ICD-10-CM | POA: Diagnosis present

## 2013-05-09 DIAGNOSIS — F1020 Alcohol dependence, uncomplicated: Secondary | ICD-10-CM | POA: Diagnosis present

## 2013-05-09 DIAGNOSIS — Q2111 Secundum atrial septal defect: Secondary | ICD-10-CM

## 2013-05-09 DIAGNOSIS — E1010 Type 1 diabetes mellitus with ketoacidosis without coma: Secondary | ICD-10-CM

## 2013-05-09 DIAGNOSIS — N179 Acute kidney failure, unspecified: Secondary | ICD-10-CM

## 2013-05-09 DIAGNOSIS — Z8673 Personal history of transient ischemic attack (TIA), and cerebral infarction without residual deficits: Secondary | ICD-10-CM

## 2013-05-09 DIAGNOSIS — F3289 Other specified depressive episodes: Secondary | ICD-10-CM | POA: Diagnosis present

## 2013-05-09 DIAGNOSIS — E871 Hypo-osmolality and hyponatremia: Secondary | ICD-10-CM

## 2013-05-09 DIAGNOSIS — Z8619 Personal history of other infectious and parasitic diseases: Secondary | ICD-10-CM

## 2013-05-09 DIAGNOSIS — F259 Schizoaffective disorder, unspecified: Secondary | ICD-10-CM | POA: Diagnosis present

## 2013-05-09 DIAGNOSIS — I4460 Unspecified fascicular block: Secondary | ICD-10-CM | POA: Diagnosis present

## 2013-05-09 DIAGNOSIS — R634 Abnormal weight loss: Secondary | ICD-10-CM | POA: Diagnosis present

## 2013-05-09 DIAGNOSIS — E781 Pure hyperglyceridemia: Secondary | ICD-10-CM | POA: Diagnosis present

## 2013-05-09 DIAGNOSIS — R7309 Other abnormal glucose: Secondary | ICD-10-CM

## 2013-05-09 DIAGNOSIS — D72829 Elevated white blood cell count, unspecified: Secondary | ICD-10-CM | POA: Diagnosis present

## 2013-05-09 DIAGNOSIS — Z87891 Personal history of nicotine dependence: Secondary | ICD-10-CM

## 2013-05-09 DIAGNOSIS — Z6836 Body mass index (BMI) 36.0-36.9, adult: Secondary | ICD-10-CM

## 2013-05-09 DIAGNOSIS — Z888 Allergy status to other drugs, medicaments and biological substances status: Secondary | ICD-10-CM

## 2013-05-09 DIAGNOSIS — Z8249 Family history of ischemic heart disease and other diseases of the circulatory system: Secondary | ICD-10-CM

## 2013-05-09 DIAGNOSIS — Z91199 Patient's noncompliance with other medical treatment and regimen due to unspecified reason: Secondary | ICD-10-CM

## 2013-05-09 DIAGNOSIS — Z8546 Personal history of malignant neoplasm of prostate: Secondary | ICD-10-CM

## 2013-05-09 DIAGNOSIS — R5383 Other fatigue: Secondary | ICD-10-CM

## 2013-05-09 DIAGNOSIS — E119 Type 2 diabetes mellitus without complications: Secondary | ICD-10-CM | POA: Diagnosis present

## 2013-05-09 LAB — COMPLETE METABOLIC PANEL WITH GFR
ALT: 47 U/L (ref 0–53)
AST: 35 U/L (ref 0–37)
Albumin: 4.3 g/dL (ref 3.5–5.2)
Alkaline Phosphatase: 150 U/L — ABNORMAL HIGH (ref 39–117)
BILIRUBIN TOTAL: 0.4 mg/dL (ref 0.3–1.2)
BUN: 38 mg/dL — ABNORMAL HIGH (ref 6–23)
CALCIUM: 11.1 mg/dL — AB (ref 8.4–10.5)
CO2: 21 meq/L (ref 19–32)
CREATININE: 1.4 mg/dL — AB (ref 0.50–1.35)
Chloride: 81 mEq/L — ABNORMAL LOW (ref 96–112)
GFR, EST AFRICAN AMERICAN: 63 mL/min
GFR, Est Non African American: 55 mL/min — ABNORMAL LOW
Glucose, Bld: 837 mg/dL (ref 70–99)
Potassium: 5.2 mEq/L (ref 3.5–5.3)
Sodium: 120 mEq/L — ABNORMAL LOW (ref 135–145)
Total Protein: 8.8 g/dL — ABNORMAL HIGH (ref 6.0–8.3)

## 2013-05-09 LAB — URINALYSIS, COMPLETE
Bacteria, UA: NEGATIVE
Bilirubin Urine: NEGATIVE
CASTS: NEGATIVE
CRYSTALS: NONE SEEN
Glucose, UA: 1000 mg/dL — AB
Hgb urine dipstick: NEGATIVE
Ketones, ur: NEGATIVE mg/dL
LEUKOCYTES UA: NEGATIVE
NITRITE: NEGATIVE
PH: 5 (ref 5.0–8.0)
Protein, ur: NEGATIVE mg/dL
RBC / HPF: NONE SEEN RBC/hpf (ref <3)
Specific Gravity, Urine: 1.03 (ref 1.005–1.030)
Urobilinogen, UA: 0.2 mg/dL (ref 0.0–1.0)

## 2013-05-09 LAB — BASIC METABOLIC PANEL
BUN: 41 mg/dL — AB (ref 6–23)
BUN: 41 mg/dL — AB (ref 6–23)
CHLORIDE: 83 meq/L — AB (ref 96–112)
CHLORIDE: 87 meq/L — AB (ref 96–112)
CO2: 22 mEq/L (ref 19–32)
CO2: 23 meq/L (ref 19–32)
CREATININE: 1.47 mg/dL — AB (ref 0.50–1.35)
Calcium: 10.7 mg/dL — ABNORMAL HIGH (ref 8.4–10.5)
Calcium: 10.6 mg/dL — ABNORMAL HIGH (ref 8.4–10.5)
Creatinine, Ser: 1.62 mg/dL — ABNORMAL HIGH (ref 0.50–1.35)
GFR calc Af Amer: 52 mL/min — ABNORMAL LOW (ref >90)
GFR calc Af Amer: 59 mL/min — ABNORMAL LOW (ref >90)
GFR calc non Af Amer: 45 mL/min — ABNORMAL LOW (ref >90)
GFR calc non Af Amer: 50 mL/min — ABNORMAL LOW (ref >90)
Glucose, Bld: 665 mg/dL (ref 70–99)
Glucose, Bld: 569 mg/dL (ref 70–99)
POTASSIUM: 5.3 meq/L (ref 3.7–5.3)
Potassium: 4.9 mEq/L (ref 3.7–5.3)
Sodium: 125 mEq/L — ABNORMAL LOW (ref 137–147)
Sodium: 126 mEq/L — ABNORMAL LOW (ref 137–147)

## 2013-05-09 LAB — OSMOLALITY: Osmolality: 316 mOsm/kg — ABNORMAL HIGH (ref 275–300)

## 2013-05-09 LAB — RAPID URINE DRUG SCREEN, HOSP PERFORMED
Amphetamines: NOT DETECTED
BARBITURATES: NOT DETECTED
BENZODIAZEPINES: NOT DETECTED
COCAINE: POSITIVE — AB
Opiates: NOT DETECTED
TETRAHYDROCANNABINOL: NOT DETECTED

## 2013-05-09 LAB — CBC
HCT: 46.2 % (ref 39.0–52.0)
Hemoglobin: 16.1 g/dL (ref 13.0–17.0)
MCH: 28.9 pg (ref 26.0–34.0)
MCHC: 34.8 g/dL (ref 30.0–36.0)
MCV: 82.9 fL (ref 78.0–100.0)
PLATELETS: 210 10*3/uL (ref 150–400)
RBC: 5.57 MIL/uL (ref 4.22–5.81)
RDW: 11.4 % — ABNORMAL LOW (ref 11.5–15.5)
WBC: 12.1 10*3/uL — AB (ref 4.0–10.5)

## 2013-05-09 LAB — GLUCOSE, CAPILLARY
Glucose-Capillary: >600 mg/dL (ref 70–99)
Glucose-Capillary: 586 mg/dL (ref 70–99)
Glucose-Capillary: 471 mg/dL — ABNORMAL HIGH (ref 70–99)

## 2013-05-09 LAB — ETHANOL: Alcohol, Ethyl (B): <10 mg/dL (ref 0–10)

## 2013-05-09 LAB — TROPONIN I: Troponin I: <0.3 ng/mL (ref <0.30)

## 2013-05-09 LAB — LACTIC ACID, PLASMA: Lactic Acid, Venous: 2.5 mmol/L — ABNORMAL HIGH (ref 0.5–2.2)

## 2013-05-09 MED ORDER — DEXTROSE-NACL 5-0.45 % IV SOLN
INTRAVENOUS | Status: DC
  Administered 2013-05-10: 04:00:00 via INTRAVENOUS

## 2013-05-09 MED ORDER — TRAZODONE HCL 100 MG PO TABS
100.0000 mg | ORAL_TABLET | Freq: Every day | ORAL | Status: DC
  Administered 2013-05-09 – 2013-05-14 (×5): 100 mg via ORAL
  Filled 2013-05-09 (×8): qty 1

## 2013-05-09 MED ORDER — CLONIDINE HCL 0.1 MG PO TABS
0.1000 mg | ORAL_TABLET | Freq: Two times a day (BID) | ORAL | Status: DC
  Administered 2013-05-09: 0.1 mg via ORAL
  Filled 2013-05-09: qty 1

## 2013-05-09 MED ORDER — PANTOPRAZOLE SODIUM 40 MG PO TBEC
40.0000 mg | DELAYED_RELEASE_TABLET | Freq: Two times a day (BID) | ORAL | Status: DC
  Administered 2013-05-09 – 2013-05-15 (×12): 40 mg via ORAL
  Filled 2013-05-09 (×12): qty 1

## 2013-05-09 MED ORDER — OXYCODONE-ACETAMINOPHEN 5-325 MG PO TABS
1.5000 | ORAL_TABLET | Freq: Three times a day (TID) | ORAL | Status: DC
Start: 2013-05-09 — End: 2013-05-15
  Administered 2013-05-09 – 2013-05-15 (×18): 1.5 via ORAL
  Filled 2013-05-09 (×27): qty 2

## 2013-05-09 MED ORDER — INSULIN REGULAR HUMAN 100 UNIT/ML IJ SOLN
INTRAMUSCULAR | Status: DC
  Administered 2013-05-09: 5.4 [IU]/h via INTRAVENOUS
  Filled 2013-05-09 (×2): qty 1

## 2013-05-09 MED ORDER — SODIUM CHLORIDE 0.9 % IV SOLN
INTRAVENOUS | Status: AC
  Administered 2013-05-09: 20:00:00 via INTRAVENOUS

## 2013-05-09 MED ORDER — ARIPIPRAZOLE 10 MG PO TABS
10.0000 mg | ORAL_TABLET | Freq: Every day | ORAL | Status: DC
  Administered 2013-05-11 – 2013-05-14 (×4): 10 mg via ORAL
  Filled 2013-05-09 (×8): qty 1

## 2013-05-09 MED ORDER — DEXTROSE 50 % IV SOLN: 25.0000 mL | INTRAVENOUS | Status: DC | PRN

## 2013-05-09 MED ORDER — SODIUM CHLORIDE 0.9 % IV SOLN
INTRAVENOUS | Status: DC
  Administered 2013-05-09: 22:00:00 via INTRAVENOUS

## 2013-05-09 MED ORDER — CARVEDILOL 6.25 MG PO TABS
18.7500 mg | ORAL_TABLET | Freq: Two times a day (BID) | ORAL | Status: DC
  Administered 2013-05-09: 18.75 mg via ORAL
  Filled 2013-05-09 (×2): qty 1

## 2013-05-09 NOTE — Patient Instructions (Signed)
Admit to the teaching service.

## 2013-05-09 NOTE — Telephone Encounter (Signed)
Call from nurse with [facility] during her weekly visit with pt.  She is with Dr Haroldine Laws.   Pt c/o thirst, frequent urination,  Weakness and being dizzy.  Cbg registers HI She is able to bring pt in for an OV vs ED visit.  Will see at 3:00

## 2013-05-09 NOTE — Progress Notes (Signed)
Subjective:   Patient ID: [name] [name] male   DOB: [birth year] 58 y.o.   MRN: [id number]  HPI: [name] is a 58 y.o. with PMH significant for DM-II with A1C 7.4 (03/28/13) comes to the office with CC high blood sugar.   Patient was seen by his heart failure nurse today at his home at around 2 PM when he was complaining of "not feeling well". She checked his CBG and the meter read "HI". The nurse called the clinic and the patient was brought to the clinic for further evaluation.   Patient reports that he has been having fever, chills, body pains for the last 3 days. He states that he has been feeling very sleepy over the last 3 days. He never recorded his temperatures but he reports having intermittent fevers along with body pains and chills. He reports that he used cocaine on Sunday (05/07/13) but denies any use of alcohol or any other recreational drugs. He reports he has not been watching his diet recently and that he has been drinking "sugar drinks (Kool-Aid)".   He denies any SOB, Chest pain, cough, head ache, N, V, diarrhea. He denies any DOE, swelling of legs. He denies any dysuria, frequent urination. He denies any skin infections.  In the clinic, patient is very lethargic and sleeping ever since he got here. Patient appears to be falling quickly back to sleep while taking history. A repeat CBG in the clinic also read "HI".   Patient denies any other symptoms.   Past Medical History  Diagnosis Date  . GERD (gastroesophageal reflux disease)   . Schizophrenia, schizo-affective   . Cataract   . Hyperlipidemia   . Wears glasses   . Wears dentures     upper  . Hypertension     uncontrolled with medication noncompliance  . Hepatitis C   . HIT (heparin-induced thrombocytopenia)   . Continuous chronic alcoholism   . H/O cocaine abuse     none in 6 months  . Stroke 01/2012    Small cerebellar infarcts right greater than left as well as questionable acute left external  capsule and caudate nuclear punctate lacunar infarcts noted per MRI (01/2012) - presumed to be embolic likely source PFO with right to left shunt (noted per TEE 01/ 2014)  . Chronic combined systolic and diastolic CHF (congestive heart failure) 02/29/2012    LV EF 40-45% per 2D echo (02/2012) with grade 1 diastolic dysfunction. 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 Degenerative lumbar spinal stenosis     s/p L2-3, L3-4, L4-5 laminectomy partial facetectomy, and bilateral foraminotomy  . History of pancreatitis 01/2011    Admission for acute pancreatitis presumed 2/2 ongoing alcohol abuse- and hypertriglyceridemia  . PFO (patent foramen ovale) 01/2012    with right to left shunt, noted per TEE in evaluation for source of embolic stroke in [id number]  . CKD (chronic kidney disease) stage 2, GFR 60-89 ml/min     BL SCr approximately 1-1.3  . 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 Hepatic steatosis     suspected 2/2 alcohol abuse  . Splenic cyst   . Colitis 05/2009    History of colitis of ascending colon noted on CT abd/pelvis (05/2009), with interval resolution with subsequent CT  . Hypertriglyceridemia   . Cancer     Prostate cancer-bx. 3 weeks ago  .  History of ETOH abuse 12-13-12     no use in 6 months   Current Outpatient Prescriptions  Medication Sig Dispense Refill  . ARIPiprazole (ABILIFY) 10 MG tablet Take 10 mg by mouth at bedtime.      . benzonatate (TESSALON) 100 MG capsule Take 1 capsule (100 mg total) by mouth 3 (three) times daily.  20 capsule  0  . carvedilol (COREG) 12.5 MG tablet Take 18.75 mg by mouth 2 (two) times daily with a meal.      . cloNIDine (CATAPRES) 0.2 MG tablet Take 0.5 tablets (0.1 mg total) by mouth 2 (two) times daily.  30 tablet  4  . CRESTOR 20 MG tablet take 1 tablet by mouth once daily  90 tablet  1  . Elastic Bandages &  Supports (WRIST SPLINT LEFT/RIGHT) MISC 1 each by Does not apply route every evening. Wear the splint at bedtime while sleeping  at least 3-4 times a week.  1 each  0  . Elastic Bandages & Supports (WRIST SPLINT/RIGHT LARGE) MISC 1 each by Does not apply route every evening. Wear at bedtime while sleeping at least 3-4 times a week.  1 each  0  . lisinopril (PRINIVIL,ZESTRIL) 20 MG tablet Take half a tablet (10mg  ) every morning and 1 tablet (20mg ) every evening.      . metFORMIN (GLUCOPHAGE) 500 MG tablet take 1 tablet by mouth twice a day with food  60 tablet  1  . oxyCODONE-acetaminophen (PERCOCET) 7.5-325 MG per tablet Take 1 tablet by mouth 3 (three) times daily.      Marland Kitchen oxymorphone (OPANA ER) 10 MG 12 hr tablet Take 10 mg by mouth every 12 (twelve) hours.      . pantoprazole (PROTONIX) 40 MG tablet Take 40 mg by mouth 2 (two) times daily.       . potassium chloride (K-DUR) 10 MEQ tablet Take 1 tablet (10 mEq total) by mouth 2 (two) times daily. Take extra 2 tabs when take Metolazone  70 tablet  3  . torsemide (DEMADEX) 20 MG tablet Take 2 tablets (40 mg total) by mouth 2 (two) times daily.  120 tablet  6  . traZODone (DESYREL) 100 MG tablet Take 100 mg by mouth at bedtime.       No current facility-administered medications for this visit.   Family History  Problem Relation Age of Onset  . CAD Mother 52  . CAD Sister   . CAD Brother 64    died from MI at age 63yo  . Hypertension     History   Social History  . Marital Status: Single    Spouse Name: N/A    Number of Children: N/A  . Years of Education: 12th grade   Occupational History  . Disability     2/2 schizophrenia   Social History Main Topics  . Smoking status: Former Smoker -- 0.50 packs/day for 30 years    Types: Cigarettes    Quit date: 06/24/2001  . Smokeless tobacco: None  . Alcohol Use: No     Comment: Quit about several months ago.  . Drug Use: None     Comment: history of cocaine abuse- none in 6  months(reminded not to use)  . Sexual Activity: None   Other Topics Concern  . None   Social History Narrative   Lives in [place] alone, has a [place] aide that helps with medications 4-5 days a week with medications, helping to clean.   Review of  Systems: ROS as per HPI.  Objective:  Physical Exam: Filed Vitals:   05/09/13 1537  BP: 124/89  Pulse: 99  Temp: 98.6 F (37 C)  TempSrc: Oral  Height: 5\' 11"  (1.803 m)  Weight: 266 lb 3.2 oz (120.748 kg)  SpO2: 96%   Constitutional: Vital signs reviewed.   Patient is a well-developed and well-nourished, lethargic, but appears in no acute distress and cooperative with exam.  Alert and oriented x3.  Head: Normocephalic and atraumatic Ear: TM normal bilaterally Nose: No erythema or drainage noted.  Turbinates normal Mouth: no erythema or exudates, MMM Eyes: Pupils unequal, round, very sluggish to reaction to light. EOMI, conjunctivae normal, No scleral icterus.  Neck: Supple, Trachea midline normal ROM, No JVD, mass, thyromegaly, or carotid bruit present.  Cardiovascular: Tachycardic but regular in rhythm. S1 normal, S2 normal, no MRG, pulses symmetric and intact bilaterally Pulmonary/Chest: normal respiratory effort, CTAB, no wheezes, rales, or rhonchi Abdominal: Soft, obese. Non-tender, non-distended, bowel sounds are normal, no masses, organomegaly, or guarding present.  GU: no CVA tenderness Musculoskeletal: No joint deformities, erythema, or stiffness, ROM full and no nontender Neurological: A&O x3, Appears lethargic. Strength is normal and symmetric bilaterally, cranial nerve II-XII are grossly intact, no focal motor deficit, sensory intact to light touch bilaterally.  Skin: Warm, dry and intact. No rash, cyanosis, or clubbing.  Psychiatric: Normal mood and affect. speech and behavior is normal. Judgment and thought content normal. Cognition and memory are normal.    Assessment & Plan:

## 2013-05-09 NOTE — Assessment & Plan Note (Signed)
Unclear etiology at this point. CBG in the clinic is 837 with the rest of the BMP values pending. In the setting of recent cocaine ingestion. It has been reported in several studies that the active use of cocaine is an independent risk factor for DKA, hence even if the patient is not in DKA, cocaine usage could still be a risk factor for his hyperglycemia.  With all the labs pending not really if he is in DKA vs HHS or is just a case of hyperglycemia. But given his history of fever, body pains and chills x 3 days and as is very lethargic in the clinic, after discussion with the attending, we thought it would be best for him to be admitted to the hospital.  Plans: Admit to the teaching service. Check CBC, CMP, CXR, U/A, Blood cultures and Urine cultures, blood alcohol level, serum ketones, 12 lead ECG Rest of the admission order as per the admitting team. Rules out source of infection

## 2013-05-09 NOTE — Telephone Encounter (Signed)
Agree, thanks

## 2013-05-09 NOTE — H&P (Signed)
Date: 05/09/2013               Patient Name:  [name] [name] MRN: [id number]  DOB: 1955/02/24 Age / Sex: 58 y.o., male   PCP: Dominic Pea, DO         Medical Service: Internal Medicine Teaching Service         Attending Physician: Dr. Axel Filler, MD    First Contact: Dr. Lesly Dukes Pager: [phone]  Second Contact: Dr. Clayburn Pert Pager: [phone]       After Hours (After 5p/  First Contact Pager: [phone]  weekends / holidays): Second Contact Pager: [phone]   Chief Complaint: "My nurse said my blood sugar was high"  History of Present Illness:  [name] [name] is a 58 y.o. male [facility] patient with PMH significant for DM2 with A1C 7.4 (03/28/13), chronic combined systolic and diastolic CHF (dx 0000000, 2D echo 10/27/12 EF [id number], grade 2 diastolic dysfunction, followed by Dr. Doylene Canard), HTN, schizophrenia, hepatitis C Ab positive, cerebellar strokes (dx 01/2012), cocaine abuse who presented to the [facility] with a chief complaint of high blood sugar.  Patient was seen by his heart failure nurse today at his home at around 2 PM when he was complaining of "not feeling well". She checked his CBG and the meter read "HI". The nurse called the clinic and the patient was brought to the clinic for further evaluation.   Patient reports that he has been having fatigue, blurry vision, chills, nausea, and frequent urination for the last 3 days. He never recorded his temperatures but he reports having intermittent fevers along with body pains. He reports that he used cocaine on Sunday (05/07/13), but denies any use of alcohol or any other recreational drugs.   He reports he has not been watching his diet recently and that he has been drinking "sugar drinks" (Kool-Aid). He denies any SOB, Chest pain, cough, head ache, vomiting, diarrhea. He denies any DOE, swelling of legs, weight gain. He thinks he has lost weight, about 20lbs. He denies any dysuria.   In the clinic, patient was noted to be  lethargic, falling to sleep while taking history. A repeat CBG in the clinic also read "HI".    Meds:  Current Outpatient Prescriptions  Medication Sig Dispense Refill  . ARIPiprazole (ABILIFY) 10 MG tablet Take 10 mg by mouth at bedtime.      . benzonatate (TESSALON) 100 MG capsule Take 1 capsule (100 mg total) by mouth 3 (three) times daily.  20 capsule  0  . carvedilol (COREG) 12.5 MG tablet Take 18.75 mg by mouth 2 (two) times daily with a meal.      . cloNIDine (CATAPRES) 0.2 MG tablet Take 0.5 tablets (0.1 mg total) by mouth 2 (two) times daily.  30 tablet  4  . CRESTOR 20 MG tablet take 1 tablet by mouth once daily  90 tablet  1  . Elastic Bandages & Supports (WRIST SPLINT LEFT/RIGHT) MISC 1 each by Does not apply route every evening. Wear the splint at bedtime while sleeping  at least 3-4 times a week.  1 each  0  . Elastic Bandages & Supports (WRIST SPLINT/RIGHT LARGE) MISC 1 each by Does not apply route every evening. Wear at bedtime while sleeping at least 3-4 times a week.  1 each  0  . lisinopril (PRINIVIL,ZESTRIL) 20 MG tablet Take half a tablet (10mg  ) every morning and 1 tablet (20mg ) every evening.      Marland Kitchen  metFORMIN (GLUCOPHAGE) 500 MG tablet take 1 tablet by mouth twice a day with food  60 tablet  1  . oxyCODONE-acetaminophen (PERCOCET) 7.5-325 MG per tablet Take 1 tablet by mouth 3 (three) times daily.      Marland Kitchen oxymorphone (OPANA ER) 10 MG 12 hr tablet Take 10 mg by mouth every 12 (twelve) hours.      . pantoprazole (PROTONIX) 40 MG tablet Take 40 mg by mouth 2 (two) times daily.       . potassium chloride (K-DUR) 10 MEQ tablet Take 1 tablet (10 mEq total) by mouth 2 (two) times daily. Take extra 2 tabs when take Metolazone  70 tablet  3  . torsemide (DEMADEX) 20 MG tablet Take 2 tablets (40 mg total) by mouth 2 (two) times daily.  120 tablet  6  . traZODone (DESYREL) 100 MG tablet Take 100 mg by mouth at bedtime.        Allergies: Allergies as of 05/09/2013 - Review Complete  05/09/2013  Allergen Reaction Noted  . Heparin Other (See Comments) 12/16/2012  . Thorazine [chlorpromazine hcl] Other (See Comments)    Past Medical History  Diagnosis Date  . GERD (gastroesophageal reflux disease)   . Schizophrenia, schizo-affective   . Cataract   . Hyperlipidemia   . Wears glasses   . Wears dentures     upper  . Hypertension     uncontrolled with medication noncompliance  . Hepatitis C   . HIT (heparin-induced thrombocytopenia)   . Continuous chronic alcoholism   . H/O cocaine abuse     none in 6 months  . Stroke 01/2012    Small cerebellar infarcts right greater than left as well as questionable acute left external capsule and caudate nuclear punctate lacunar infarcts noted per MRI (01/2012) - presumed to be embolic likely source PFO with right to left shunt (noted per TEE 01/ 2014)  . Chronic combined systolic and diastolic CHF (congestive heart failure) 02/29/2012    LV EF 40-45% per 2D echo (02/2012) with grade 1 diastolic dysfunction. 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 Degenerative lumbar spinal stenosis     s/p L2-3, L3-4, L4-5 laminectomy partial facetectomy, and bilateral foraminotomy  . History of pancreatitis 01/2011    Admission for acute pancreatitis presumed 2/2 ongoing alcohol abuse- and hypertriglyceridemia  . PFO (patent foramen ovale) 01/2012    with right to left shunt, noted per TEE in evaluation for source of embolic stroke in [id number]  . CKD (chronic kidney disease) stage 2, GFR 60-89 ml/min     BL SCr approximately 1-1.3  . 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 Hepatic steatosis     suspected 2/2 alcohol abuse  . Splenic cyst   . Colitis 05/2009    History of colitis of ascending colon noted on CT abd/pelvis (05/2009), with interval resolution with subsequent CT  . Hypertriglyceridemia   . Cancer      Prostate cancer-bx. 3 weeks ago  . History of ETOH abuse 12-13-12     no use in 6 months   Past Surgical History  Procedure Laterality Date  . L2-3, l3-4, l4-5 laminectomy, partial facetectomy    . Left achilles Right 2007  . Tonsillectomy    . Knee arthroscopy    . I&d extremity  03/20/2011    Procedure: IRRIGATION AND DEBRIDEMENT EXTREMITY;  Surgeon: Kathalene Frames  Mayer Camel, MD;  Location: [place];  Service: Orthopedics;  Laterality: Left;  I&D LEFT ACHILLIES TENDON  . Eye surgery      right eye  . Resection distal clavical  09/17/2011    Procedure: RESECTION DISTAL CLAVICAL;  Surgeon: Nita Sells, MD;  Location: [place];  Service: Orthopedics;  Laterality: Right;  right shoulder arthroscopy with sad and open distal clavicle excision   . Esophagogastroduodenoscopy N/A 06/25/2012    Procedure: ESOPHAGOGASTRODUODENOSCOPY (EGD);  Surgeon: Milus Banister, MD;  Location: [place];  Service: Endoscopy;  Laterality: N/A;  . Robot assisted laparoscopic radical prostatectomy N/A 12/16/2012    Procedure: ROBOTIC ASSISTED LAPAROSCOPIC PROSTATECTOMY ;  Surgeon: Ardis Hughs, MD;  Location: [facility];  Service: Urology;  Laterality: N/A;   Family History  Problem Relation Age of Onset  . CAD Mother 45  . CAD Sister   . CAD Brother 37    died from MI at age 104yo  . Hypertension     History   Social History  . Marital Status: Single    Spouse Name: N/A    Number of Children: N/A  . Years of Education: 12th grade   Occupational History  . Disability     2/2 schizophrenia   Social History Main Topics  . Smoking status: Former Smoker -- 0.50 packs/day for 30 years    Types: Cigarettes    Quit date: 06/24/2001  . Smokeless tobacco: Not on file  . Alcohol Use: No     Comment: Quit about several months ago.  . Drug Use: Not on file     Comment: history of cocaine abuse- none in 6 months(reminded not to use)  . Sexual Activity: Not on file   Other Topics Concern   . Not on file   Social History Narrative   Lives in [place] alone, has a [facility] aide that helps with medications 4-5 days a week with medications, helping to clean.    Review of Systems: Pertinent items are noted in HPI.  Physical Exam: There were no vitals taken for this visit. Physical Exam  Constitutional: He is oriented to person, place, and time and well-developed, well-nourished, and in no distress.  HENT:  Head: Normocephalic and atraumatic.  Eyes: Conjunctivae and EOM are normal. Pupils are equal, round, and reactive to light.  Neck: Normal range of motion. Neck supple.  Cardiovascular: Normal rate, regular rhythm and normal heart sounds.  Exam reveals no gallop and no friction rub.   No murmur heard. Pulmonary/Chest: Effort normal and breath sounds normal. No respiratory distress. He has no wheezes. He has no rales. He exhibits no tenderness.  Abdominal: Soft. Bowel sounds are normal. He exhibits no distension and no mass. There is tenderness (Endorses diffuse mild tenderness). There is no rebound and no guarding.  Musculoskeletal: Normal range of motion. He exhibits no edema and no tenderness.  Neurological: He is alert and oriented to person, place, and time. GCS score is 15.  Appears sleepy, but participates in conversation.  Skin: Skin is warm and dry.  Psychiatric: Mood normal.  Distant affect.    Lab results: Basic Metabolic Panel:  Recent Labs  05/09/13 1603  NA 120*  K 5.2  CL 81*  CO2 21  GLUCOSE 837*  BUN 38*  CREATININE 1.40*  CALCIUM 11.1*   Liver Function Tests:  Recent Labs  05/09/13 1603  AST 35  ALT 47  ALKPHOS 150*  BILITOT 0.4  PROT 8.8*  ALBUMIN 4.3  CBC:  Recent Labs  05/09/13 1603  WBC 12.1*  HGB 16.1  HCT 46.2  MCV 82.9  PLT 210   CBG:  Recent Labs  05/09/13 1548  GLUCAP >600*   Urine Drug Screen: Drugs of Abuse     Component Value Date/Time   LABOPIA NONE DETECTED 12/16/2012 0617   LABOPIA NEG 09/15/2012  1036   COCAINSCRNUR NONE DETECTED 12/16/2012 0617   COCAINSCRNUR NEG 09/15/2012 1036   LABBENZ NONE DETECTED 12/16/2012 0617   LABBENZ NEG 09/15/2012 1036   AMPHETMU NONE DETECTED 12/16/2012 0617   THCU NONE DETECTED 12/16/2012 0617   LABBARB NONE DETECTED 12/16/2012 0617   LABBARB NEG 09/15/2012 1036    Alcohol Level:  Recent Labs  05/09/13 [place] <10    Imaging results:  CXR pending.  Other results: EKG: Pending.  Assessment & Plan by Problem: [name] [name] is a 58 y.o. male [facility] patient with PMH significant for DM2 with A1C 7.4 (03/28/13), chronic combined systolic and diastolic CHF (dx 0000000, 2D echo 10/27/12 EF A999333, grade 2 diastolic dysfunction, followed by Dr. Doylene Canard), HTN, schizophrenia, hepatitis C Ab positive, cerebellar strokes (dx 01/2012), cocaine abuse who presented to the [facility] with a chief complaint of high blood sugar.  #DKA - Patient presents with hyperglycemia to 837 and a history of polyuria, polydipsia, blurry vision, nausea, overall fatigue and lethargy x3 days.  Anion gap is 18. Bicarb is 21. He has a history of type 2 diabetes with most recent A1c 7.4 on 03/28/2013 (this is an increase from 5.8 on 02/20/2012). Takes only Metformin at home. Notably, the patient admits to using cocaine on Sunday, 3 days ago. Cocaine use can certainly trigger DKA. Alcohol level undetectable. UDS pending. We will rule out infectious etiology given history of subjective fevers and body pains at home. He is afebrile currently. HR 99. He has a mild leukocytosis to 12.1. We'll also rule out cardiac ischemia given history of heart failure. - Admit to IMTS, SDU - DKA protocol (continue insulin gtt, IVFs, NPO)  - CBG q1h - NS bolus 250cc x2 - Followed by IVF NS @75cc /hr to be converted to D5-0.5NS once BG <250  - BMETs q2h until AG closed x 2  - Monitor K and replete as necessary  - Start [place] insulin once AG closed x 2  - Continue insulin drip for 2 hours after [place] insulin started    - CXR - UA - Blood cultures x2 - Urine culture - UDS - Serum ketones - Lactic acid - 12 lead EKG  - Trend troponins  - Pain control with home Percocet 7.5-325 TID - Consult to diabetes educator   #Chronic combined systolic and diastolic CHF - Last 2D echo 10/27/12 showed EF A999333, grade 2 diastolic dysfunction. He is followed by Dr. Doylene Canard and last saw Darrick Grinder on 03/29/2013. Goal weight is less than 276 lbs. - Strict I/O - Daily weights - Continue home carvedilol 18.75 mg twice a day with meals - Holding home torsemide 40 BID while hydrating - Monitor volume status closely   #Hyponatremia - Corrected sodium for his hyperglycemia is 138, suggesting the patient has maintained adequate fluid intake, or the onset of his hyperglycemia was very diffuse. - IVF as above  #Acute on chronic CKD stage 2 - Cr 1.40, it was 0.93 about 1 month ago. - Gentle IVF as above - Holding home lisinopril 10 mg in am and 20 mg in pm  #HTN - Normotensive currently. Amy Clegg's  note from 3/4 mentions consideration of stopping the Clonidine and starting Spironolactone depending on his renal function. For now we will continue the regimen in Epic. - Continue home Clonidine 0.1mg  BID  #Weight loss - Weight decreased from 284lbs on 3/17 to 266lbs today. Could represent better control of his heart failure as he has been followed by a HF nurse via para-medicine starting in March. - Continue to monitor  #Schizophrenia/depression - Stable. Continue home trazodone 100 mg daily at bedtime, Abilify 10 mg daily at bedtime.  #History of hepatitis C - Ab reactive. No significant LFT abnormalities today.  #GERD - Protonix 40mg  BID.  #DVT PPX - Patient has a heparin allergy. SCDs for now.   Dispo: Disposition is deferred at this time, awaiting improvement of current medical problems. Anticipated discharge in approximately 2-4 day(s).   The patient does have a current PCP Dominic Pea, DO) and does need an [facility]  hospital follow-up appointment after discharge.  The patient does not have transportation limitations that hinder transportation to clinic appointments.  Signed: Lesly Dukes, MD 05/09/2013, 5:57 PM    Lesly Dukes, MD  [email or web address] Pager # [phone] After hours and weekends # [phone] Office # [phone]

## 2013-05-10 ENCOUNTER — Other Ambulatory Visit: Payer: Self-pay

## 2013-05-10 DIAGNOSIS — E1310 Other specified diabetes mellitus with ketoacidosis without coma: Principal | ICD-10-CM

## 2013-05-10 LAB — BASIC METABOLIC PANEL
BUN: 45 mg/dL — AB (ref 6–23)
BUN: 46 mg/dL — ABNORMAL HIGH (ref 6–23)
BUN: 44 mg/dL — ABNORMAL HIGH (ref 6–23)
BUN: 44 mg/dL — AB (ref 6–23)
BUN: 42 mg/dL — AB (ref 6–23)
BUN: 43 mg/dL — AB (ref 6–23)
BUN: 44 mg/dL — AB (ref 6–23)
CALCIUM: 9.8 mg/dL (ref 8.4–10.5)
CALCIUM: 10.4 mg/dL (ref 8.4–10.5)
CALCIUM: 10.6 mg/dL — AB (ref 8.4–10.5)
CHLORIDE: 96 meq/L (ref 96–112)
CHLORIDE: 95 meq/L — AB (ref 96–112)
CHLORIDE: 99 meq/L (ref 96–112)
CO2: 22 mEq/L (ref 19–32)
CO2: 23 mEq/L (ref 19–32)
CO2: 23 meq/L (ref 19–32)
CO2: 24 mEq/L (ref 19–32)
CO2: 24 mEq/L (ref 19–32)
CO2: 21 mEq/L (ref 19–32)
CO2: 23 meq/L (ref 19–32)
CREATININE: 1.91 mg/dL — AB (ref 0.50–1.35)
CREATININE: 2 mg/dL — AB (ref 0.50–1.35)
CREATININE: 1.47 mg/dL — AB (ref 0.50–1.35)
CREATININE: 1.91 mg/dL — AB (ref 0.50–1.35)
Calcium: 10.1 mg/dL (ref 8.4–10.5)
Calcium: 10 mg/dL (ref 8.4–10.5)
Calcium: 9.8 mg/dL (ref 8.4–10.5)
Calcium: 9.8 mg/dL (ref 8.4–10.5)
Chloride: 90 mEq/L — ABNORMAL LOW (ref 96–112)
Chloride: 93 mEq/L — ABNORMAL LOW (ref 96–112)
Chloride: 97 mEq/L (ref 96–112)
Chloride: 98 mEq/L (ref 96–112)
Creatinine, Ser: 2.18 mg/dL — ABNORMAL HIGH (ref 0.50–1.35)
Creatinine, Ser: 2.08 mg/dL — ABNORMAL HIGH (ref 0.50–1.35)
Creatinine, Ser: 1.55 mg/dL — ABNORMAL HIGH (ref 0.50–1.35)
GFR calc Af Amer: 43 mL/min — ABNORMAL LOW (ref >90)
GFR calc Af Amer: 36 mL/min — ABNORMAL LOW (ref >90)
GFR calc Af Amer: 38 mL/min — ABNORMAL LOW (ref >90)
GFR calc Af Amer: 40 mL/min — ABNORMAL LOW (ref >90)
GFR calc Af Amer: 43 mL/min — ABNORMAL LOW (ref >90)
GFR calc Af Amer: 59 mL/min — ABNORMAL LOW (ref >90)
GFR calc non Af Amer: 37 mL/min — ABNORMAL LOW (ref >90)
GFR calc non Af Amer: 35 mL/min — ABNORMAL LOW (ref >90)
GFR calc non Af Amer: 37 mL/min — ABNORMAL LOW (ref >90)
GFR calc non Af Amer: 50 mL/min — ABNORMAL LOW (ref >90)
GFR, EST AFRICAN AMERICAN: 55 mL/min — AB (ref >90)
GFR, EST NON AFRICAN AMERICAN: 31 mL/min — AB (ref >90)
GFR, EST NON AFRICAN AMERICAN: 33 mL/min — AB (ref >90)
GFR, EST NON AFRICAN AMERICAN: 47 mL/min — AB (ref >90)
GLUCOSE: 180 mg/dL — AB (ref 70–99)
GLUCOSE: 198 mg/dL — AB (ref 70–99)
GLUCOSE: 160 mg/dL — AB (ref 70–99)
GLUCOSE: 310 mg/dL — AB (ref 70–99)
GLUCOSE: 379 mg/dL — AB (ref 70–99)
Glucose, Bld: 200 mg/dL — ABNORMAL HIGH (ref 70–99)
Glucose, Bld: 160 mg/dL — ABNORMAL HIGH (ref 70–99)
POTASSIUM: 4.3 meq/L (ref 3.7–5.3)
Potassium: 4.5 mEq/L (ref 3.7–5.3)
Potassium: 4.1 mEq/L (ref 3.7–5.3)
Potassium: 4.4 mEq/L (ref 3.7–5.3)
Potassium: 4.7 mEq/L (ref 3.7–5.3)
Potassium: 4.6 mEq/L (ref 3.7–5.3)
Potassium: 4.5 mEq/L (ref 3.7–5.3)
Sodium: 130 mEq/L — ABNORMAL LOW (ref 137–147)
Sodium: 133 mEq/L — ABNORMAL LOW (ref 137–147)
Sodium: 136 mEq/L — ABNORMAL LOW (ref 137–147)
Sodium: 135 mEq/L — ABNORMAL LOW (ref 137–147)
Sodium: 134 mEq/L — ABNORMAL LOW (ref 137–147)
Sodium: 134 mEq/L — ABNORMAL LOW (ref 137–147)
Sodium: 133 mEq/L — ABNORMAL LOW (ref 137–147)

## 2013-05-10 LAB — KETONES, QUALITATIVE: Acetone, Bld: NEGATIVE

## 2013-05-10 LAB — GLUCOSE, CAPILLARY
GLUCOSE-CAPILLARY: 138 mg/dL — AB (ref 70–99)
GLUCOSE-CAPILLARY: 147 mg/dL — AB (ref 70–99)
GLUCOSE-CAPILLARY: 162 mg/dL — AB (ref 70–99)
GLUCOSE-CAPILLARY: 170 mg/dL — AB (ref 70–99)
GLUCOSE-CAPILLARY: 199 mg/dL — AB (ref 70–99)
GLUCOSE-CAPILLARY: 202 mg/dL — AB (ref 70–99)
GLUCOSE-CAPILLARY: 226 mg/dL — AB (ref 70–99)
GLUCOSE-CAPILLARY: 259 mg/dL — AB (ref 70–99)
GLUCOSE-CAPILLARY: 314 mg/dL — AB (ref 70–99)
GLUCOSE-CAPILLARY: 341 mg/dL — AB (ref 70–99)
Glucose-Capillary: 115 mg/dL — ABNORMAL HIGH (ref 70–99)
Glucose-Capillary: 128 mg/dL — ABNORMAL HIGH (ref 70–99)
Glucose-Capillary: 138 mg/dL — ABNORMAL HIGH (ref 70–99)
Glucose-Capillary: 148 mg/dL — ABNORMAL HIGH (ref 70–99)
Glucose-Capillary: 169 mg/dL — ABNORMAL HIGH (ref 70–99)
Glucose-Capillary: 195 mg/dL — ABNORMAL HIGH (ref 70–99)
Glucose-Capillary: 215 mg/dL — ABNORMAL HIGH (ref 70–99)
Glucose-Capillary: 466 mg/dL — ABNORMAL HIGH (ref 70–99)

## 2013-05-10 LAB — PRESCRIPTION ABUSE MONITORING 15P, URINE
AMPHETAMINE/METH: NEGATIVE ng/mL
BARBITURATE SCREEN, URINE: NEGATIVE ng/mL
BENZODIAZEPINE SCREEN, URINE: NEGATIVE ng/mL
Buprenorphine, Urine: NEGATIVE ng/mL
CANNABINOID SCRN UR: NEGATIVE ng/mL
CREATININE, URINE: 78.13 mg/dL (ref >20.0)
Carisoprodol, Urine: NEGATIVE ng/mL
FENTANYL URINE: NEGATIVE ng/mL
MEPERIDINE UR: NEGATIVE ng/mL
Methadone Screen, Urine: NEGATIVE ng/mL
Opiate Screen, Urine: NEGATIVE ng/mL
Propoxyphene: NEGATIVE ng/mL
TRAMADOL UR: NEGATIVE ng/mL
Zolpidem, Urine: NEGATIVE ng/mL

## 2013-05-10 LAB — TROPONIN I: Troponin I: <0.3 ng/mL (ref <0.30)

## 2013-05-10 LAB — MAGNESIUM: MAGNESIUM: 2.2 mg/dL (ref 1.5–2.5)

## 2013-05-10 LAB — URINE CULTURE
Colony Count: NO GROWTH
Organism ID, Bacteria: NO GROWTH

## 2013-05-10 MED ORDER — SODIUM CHLORIDE 0.9 % IV BOLUS (SEPSIS)
500.0000 mL | Freq: Once | INTRAVENOUS | Status: AC
  Administered 2013-05-10: 500 mL via INTRAVENOUS

## 2013-05-10 MED ORDER — POTASSIUM CHLORIDE 10 MEQ/100ML IV SOLN
10.0000 meq | INTRAVENOUS | Status: AC
  Administered 2013-05-10 (×2): 10 meq via INTRAVENOUS
  Filled 2013-05-10 (×2): qty 100

## 2013-05-10 MED ORDER — MORPHINE SULFATE 4 MG/ML IJ SOLN: 4.0000 mg | INTRAMUSCULAR | Status: DC | PRN

## 2013-05-10 MED ORDER — MORPHINE SULFATE 2 MG/ML IJ SOLN
2.0000 mg | INTRAMUSCULAR | Status: DC | PRN
  Administered 2013-05-14: 2 mg via INTRAVENOUS
  Filled 2013-05-10: qty 1

## 2013-05-10 MED ORDER — SODIUM CHLORIDE 0.9 % IV BOLUS (SEPSIS)
250.0000 mL | INTRAVENOUS | Status: AC
  Administered 2013-05-10 (×2): 250 mL via INTRAVENOUS

## 2013-05-10 MED ORDER — LIVING WELL WITH DIABETES BOOK
Freq: Once | Status: AC
  Administered 2013-05-10: 15:00:00
  Filled 2013-05-10: qty 1

## 2013-05-10 MED ORDER — INSULIN DETEMIR 100 UNIT/ML ~~LOC~~ SOLN
36.0000 [IU] | Freq: Every day | SUBCUTANEOUS | Status: DC
  Administered 2013-05-10 – 2013-05-11 (×2): 36 [IU] via SUBCUTANEOUS
  Filled 2013-05-10 (×3): qty 0.36

## 2013-05-10 MED ORDER — INSULIN PEN STARTER KIT
1.0000 | Freq: Once | Status: DC
  Filled 2013-05-10 (×2): qty 1

## 2013-05-10 MED ORDER — INSULIN ASPART 100 UNIT/ML ~~LOC~~ SOLN: 0.0000 [IU] | Freq: Three times a day (TID) | SUBCUTANEOUS | Status: DC

## 2013-05-10 MED ORDER — INSULIN ASPART 100 UNIT/ML ~~LOC~~ SOLN
0.0000 [IU] | SUBCUTANEOUS | Status: DC
  Administered 2013-05-10: 2 [IU] via SUBCUTANEOUS
  Administered 2013-05-10: 5 [IU] via SUBCUTANEOUS
  Administered 2013-05-11: 3 [IU] via SUBCUTANEOUS
  Administered 2013-05-11 (×2): 5 [IU] via SUBCUTANEOUS

## 2013-05-10 NOTE — Progress Notes (Signed)
Nutrition Brief Note  Patient identified on the Malnutrition Screening Tool (MST) Report for recent weight loss and eating poorly because of a decreased appetite.  Per readings below, patient has had a 5% weight loss x 1 month; desirable given obesity.  Wt Readings from Last 15 Encounters:  05/10/13 268 lb 4.8 oz (121.7 kg)  05/09/13 266 lb 3.2 oz (120.748 kg)  04/11/13 284 lb 11.2 oz (129.139 kg)  04/05/13 276 lb 12.8 oz (125.556 kg)  03/29/13 282 lb (127.914 kg)  03/28/13 281 lb 12.8 oz (127.824 kg)  03/22/13 291 lb 12 oz (132.337 kg)  03/01/13 276 lb 1.9 oz (125.247 kg)  02/14/13 278 lb (126.1 kg)  02/12/13 270 lb (122.471 kg)  02/06/13 283 lb 4.8 oz (128.504 kg)  01/06/13 281 lb 6.4 oz (127.642 kg)  12/16/12 277 lb (125.646 kg)  12/16/12 277 lb (125.646 kg)  12/13/12 277 lb (125.646 kg)    Body mass index is 37.44 kg/(m^2). Patient meets criteria for Obesity  Class II based on current BMI.   Current diet order is NPO; patient hungry.  Reports a good appetite.  Labs and medications reviewed.   No nutrition interventions warranted at this time. If nutrition issues arise, please consult RD.   Arthur Holms, RD, LDN Pager #: [phone] After-Hours Pager #: [phone]

## 2013-05-10 NOTE — Progress Notes (Signed)
INTERNAL MEDICINE TEACHING SERVICE Night Float Progress Note   Subjective:    We were called overnight by the RN for evaluation of increased oxygen need an increased lethargy. At time of evaluation, pt was on 2L of oxygen via nasal cannula and saturating at 92-94%. His blood pressure was also low in the ranges of 70s to 80s over 50s.  Patient denied chest pain or shortness of breath. UDS obtained earlier revealed cocaine positive. He was fully alert and responding to questions asking for ice chips or water.    Objective:    BP 107/72  Pulse 64  Temp(Src) 98.3 F (36.8 C) (Oral)  Resp 16  Ht 5\' 11"  (1.803 m)  Wt 264 lb 12.4 oz (120.1 kg)  BMI 36.94 kg/m2  SpO2 94%   Labs: Basic Metabolic Panel:    Component Value Date/Time   NA 130* 05/10/2013 0025   K 4.5 05/10/2013 0025   CL 90* 05/10/2013 0025   CO2 22 05/10/2013 0025   BUN 43* 05/10/2013 0025   CREATININE 1.91* 05/10/2013 0025   CREATININE 1.40* 05/09/2013 1603   GLUCOSE 379* 05/10/2013 0025   CALCIUM 10.6* 05/10/2013 0025    CBC:    Component Value Date/Time   WBC 12.1* 05/09/2013 1603   HGB 16.1 05/09/2013 1603   HCT 46.2 05/09/2013 1603   PLT 210 05/09/2013 1603   MCV 82.9 05/09/2013 1603   NEUTROABS 3.2 02/12/2013 0910   LYMPHSABS 1.7 02/12/2013 0910   MONOABS 0.8 02/12/2013 0910   EOSABS 0.2 02/12/2013 0910   BASOSABS 0.0 02/12/2013 0910    Cardiac Enzymes: Lab Results  Component Value Date   CKTOTAL 141 04/14/2012   CKMB 11.2* 02/25/2012   TROPONINI <0.30 05/09/2013    Physical Exam: General: Vital signs reviewed and noted. Well-developed, well-nourished, in no acute distress; but appears lethargic. He is alert, appropriate and cooperative throughout examination.  Lungs:  Normal respiratory effort. Clear to auscultation BL without crackles or wheezes.  Heart: RRR. S1 and S2 normal without gallop, murmur, or rubs.  Abdomen:  BS normoactive. Soft, Nondistended, non-tender.  No masses or organomegaly.  Extremities: No  pretibial edema.     Assessment/ Plan:    # Increased Lethargy and hypotension: Obtain a twelve-lead ECG, which revealed an old incomplete left bundle branch block, without new ischemic changes. Troponins have been negative. Reviewed his labs indicating improvement anion gap to 13 and improved K level. He is still on the insulin drip. BP equal in both arms. Doubt ACS but dehydration can not be excluded in a patient with DKA. Patient had received his Coreg, and clonidine 2 hours prior to change of his clinical status. Actually, when O2 was turned off, he was able to maintain sats of 92%.  Plan  - EKG - negative for ischemic changes - cont with cycling of troponin - hold Clonidine and Coreg in the setting of hypotension - administer 500 cc of NS. If SBP remain lower than 155mmHg, will give another bolus of 500 cc - requested the nurse to page for chest pain or increased O2 requirement, and at that time we can obtain ABGs - in case of chest pain, we can give Morphine 2 mg  - CXR which was performed a few hours ago was unremarkable there no need for recheck. - doubt PE at this time but if he continues to do unwell, I have a low threshold for doing a chest CTA. However, patient is allergic to heparin - in terms of  his DKA, we will wait for another set of BMET in 2 hours, if gap is closed will change to subq insulin and allow a full diet.  - will closely monitor  Jessee Avers, MD  05/10/2013, 1:32 AM

## 2013-05-10 NOTE — Progress Notes (Signed)
Subjective: Patient seen and examined at the bedside this morning. He feels much better. Blurry vision and fatigue have improved. No more nausea. He is hungry and asks when he can eat. Denies SOB, chest pain.  Objective: Vital signs in last 24 hours: Filed Vitals:   05/10/13 0500 05/10/13 0530 05/10/13 0600 05/10/13 0700  BP: 103/64 98/75 98/71  118/69  Pulse: 59 61 60 63  Temp:    97.5 F (36.4 C)  TempSrc:    Oral  Resp: 12 14 22 23   Height:      Weight:      SpO2: 96% 97% 95% 96%   Weight change:   Intake/Output Summary (Last 24 hours) at 05/10/13 1022 Last data filed at 05/10/13 0700  Gross per 24 hour  Intake   2400 ml  Output    350 ml  Net   2050 ml   Physical Exam  Constitutional: He is oriented to person, place, and time and well-developed, well-nourished, and in no distress.  HENT:  Head: Normocephalic and atraumatic.  Eyes: Conjunctivae and EOM are normal. Pupils are equal, round, and reactive to light.  Neck: Normal range of motion. Neck supple.  Cardiovascular: Normal rate, regular rhythm and normal heart sounds. Exam reveals no gallop and no friction rub.  No murmur heard.  Pulmonary/Chest: Effort normal and breath sounds normal. No respiratory distress. He has no wheezes. He has no rales. He exhibits no tenderness.  Abdominal: Soft. Bowel sounds are normal. He exhibits no distension and no mass. Non tender. There is no rebound and no guarding.  Musculoskeletal: Normal range of motion. He exhibits no edema and no tenderness.  Neurological: He is alert and oriented to person, place, and time. GCS score is 15.  Skin: Skin is warm and dry.  Psychiatric: Mood normal.   Distant affect.   Lab Results: Basic Metabolic Panel:  Recent Labs Lab 05/10/13 0135  05/10/13 0600 05/10/13 0715  NA 133*  < > 135* 134*  K 4.1  < > 4.7 4.6  CL 93*  < > 96 95*  CO2 23  < > 24 23  GLUCOSE 310*  < > 198* 180*  BUN 44*  < > 46* 44*  CREATININE 2.18*  < > 2.00* 1.91*    CALCIUM 10.4  < > 10.0 9.8  MG 2.2  --   --   --   < > = values in this interval not displayed. Liver Function Tests:  Recent Labs Lab 05/09/13 1603  AST 35  ALT 47  ALKPHOS 150*  BILITOT 0.4  PROT 8.8*  ALBUMIN 4.3   CBC:  Recent Labs Lab 05/09/13 1603  WBC 12.1*  HGB 16.1  HCT 46.2  MCV 82.9  PLT 210   Cardiac Enzymes:  Recent Labs Lab 05/09/13 2001 05/10/13 0135 05/10/13 0715  TROPONINI <0.30 <0.30 <0.30   CBG:  Recent Labs Lab 05/10/13 0431 05/10/13 0534 05/10/13 0630 05/10/13 0741 05/10/13 0845 05/10/13 0947  GLUCAP 199* 195* 202* 169* 147* 128*   Urine Drug Screen: Drugs of Abuse     Component Value Date/Time   LABOPIA NONE DETECTED 05/09/2013 2129   LABOPIA NEG 05/09/2013 1704   COCAINSCRNUR POSITIVE* 05/09/2013 2129   COCAINSCRNUR PPS 05/09/2013 1704   LABBENZ NONE DETECTED 05/09/2013 2129   LABBENZ NEG 05/09/2013 1704   AMPHETMU NONE DETECTED 05/09/2013 2129   THCU NONE DETECTED 05/09/2013 2129   LABBARB NONE DETECTED 05/09/2013 2129   LABBARB NEG 05/09/2013 1704  Alcohol Level:  Recent Labs Lab 05/09/13 1642  ETH <10   Urinalysis:  Recent Labs Lab 05/09/13 1704  COLORURINE YELLOW  LABSPEC 1.030  PHURINE 5.0  GLUCOSEU 1000*  HGBUR NEG  BILIRUBINUR NEG  KETONESUR NEG  PROTEINUR NEG  UROBILINOGEN 0.2  NITRITE NEG  LEUKOCYTESUR NEG    Micro Results: Recent Results (from the past 240 hour(s))  CULTURE, BLOOD (SINGLE)     Status: None   Collection Time    05/09/13  4:42 PM      Result Value Ref Range Status   Preliminary Report Blood Culture received; No Growth to date;   Preliminary   Preliminary Report Culture will be held for 5 days before issuing   Preliminary   Preliminary Report a Final Negative report.   Preliminary  CULTURE, BLOOD (SINGLE)     Status: None   Collection Time    05/09/13  4:50 PM      Result Value Ref Range Status   Preliminary Report Blood Culture received; No Growth to date;   Preliminary    Preliminary Report Culture will be held for 5 days before issuing   Preliminary   Preliminary Report a Final Negative report.   Preliminary   Studies/Results: Dg Chest 2 View  05/09/2013   CLINICAL DATA:  DKA with fevers  EXAM: CHEST  2 VIEW  COMPARISON:  CT ANGIO CHEST W/CM &/OR WO/CM dated 04/05/2013; DG CHEST 1V PORT dated 04/05/2013; DG CHEST 2 VIEW dated 02/12/2013  FINDINGS: The heart size and mediastinal contours are within normal limits. Both lungs are clear. The visualized skeletal structures are unremarkable.  IMPRESSION: No active cardiopulmonary disease.   Electronically Signed   By: Kathreen Devoid   On: 05/09/2013 22:23   Medications: I have reviewed the patient's current medications. Scheduled Meds: . ARIPiprazole  10 mg Oral QHS  . oxyCODONE-acetaminophen  1.5 tablet Oral TID  . pantoprazole  40 mg Oral BID  . traZODone  100 mg Oral QHS   Continuous Infusions: . sodium chloride Stopped (05/10/13 0412)  . dextrose 5 % and 0.45% NaCl 75 mL/hr at 05/10/13 0407  . insulin (NOVOLIN-R) infusion 12.8 Units/hr (05/10/13 0637)   PRN Meds:.dextrose, morphine injection  Assessment/Plan: BRYCEON WOLAVER is a 58 y.o. male [facility] patient with PMH significant for DM2 with A1C 7.4 (03/28/13), chronic combined systolic and diastolic CHF (dx 0000000, 2D echo 10/27/12 EF [id number], grade 2 diastolic dysfunction, followed by Dr. Doylene Canard), HTN, schizophrenia, hepatitis C Ab positive, cerebellar strokes (dx 01/2012), cocaine abuse who presented to the [facility] with a chief complaint of high blood sugar.   #DKA - UDS positive for cocaine and this is likely the trigger. Ischemic work up has been negative: Troponins have been negative x2, EKG shows right axis deviation, NSR, old incomplete LBBB, no new ST/T wave changes. Infectious workup also negative: CXR no acute findings, UA clean. Lactate mildly elevated at 2.5. Anion gap overnight 16 > 18 > 17 > 15> 15 this am. - DKA protocol (continue insulin gtt, IVFs, NPO)    - CBG q1h  - NS bolus 250cc x2  - IVF D5-0.5NS @75cc /hr given BG <250  - BMETs q2h until AG closed x 2  - Monitor K and replete as necessary  - Start [place] insulin once AG closed x 2  - Continue insulin drip for 2 hours after [place] insulin started  - Blood cultures x2 > NGTD - Urine culture > Pending - Pain control with home Percocet 7.5-325  TID  - Consult to diabetes educator  - We have discussed cocaine cessation - Repeat lactate in am  Glucose-Capillary  Date Value Ref Range Status  05/10/2013 128* 70 - 99 mg/dL Final  05/10/2013 147* 70 - 99 mg/dL Final  05/10/2013 169* 70 - 99 mg/dL Final  05/10/2013 202* 70 - 99 mg/dL Final  05/10/2013 195* 70 - 99 mg/dL Final    #Hypotension - Overnight he was evaluated by the night team for lethargy, noted to be hypotensive to 79/50. They felt he was clinically hypovolemic and provided NS bolus 500cc x3. BP has responded well, improving to 118/69. - NS bolus 250cc x2 - Holding home clonidine and Coreg  #Hypoxia - Patient had one documented desaturation to 87% on room air overnight. He was provided O2 2L [place] with improvement to 96-98%. CXR does not show edema or infiltrate. Lungs clear. On my exam he was saturating well on room air. - Closely monitor oxygen saturations given CHF and IVF  #Chronic combined systolic and diastolic CHF - Last 2D echo 10/27/12 showed EF A999333, grade 2 diastolic dysfunction. He is followed by Dr. Doylene Canard and last saw Darrick Grinder on 03/29/2013. Goal weight is less than 276 lbs.  - Strict I/O  - Daily weights - Holding home carvedilol 18.75 mg twice a day with meals given hypotension - Holding home torsemide 40 BID while hydrating  - Monitor volume status closely   Filed Weights   05/09/13 1947 05/09/13 2009 05/10/13 0330  Weight: 268 lb (121.564 kg) 264 lb 12.4 oz (120.1 kg) 268 lb 4.8 oz (121.7 kg)    #Hyponatremia, improving - Trend below, improving as expected with control of hyperglycemia. - IVF, insulin as above    Sodium  Date Value Ref Range Status  05/10/2013 134* 137 - 147 mEq/L Final  05/10/2013 135* 137 - 147 mEq/L Final  05/10/2013 136* 137 - 147 mEq/L Final  05/10/2013 133* 137 - 147 mEq/L Final  05/10/2013 130* 137 - 147 mEq/L Final    #Acute on chronic CKD stage 2 - Cr bumped to a high of 2.18 overnight, but is now down trending.  - Gentle IVF as above  - Holding home lisinopril 10 mg in am and 20 mg in pm  - Continue to monitor  Creatinine, Ser  Date Value Ref Range Status  05/10/2013 1.91* 0.50 - 1.35 mg/dL Final  05/10/2013 2.00* 0.50 - 1.35 mg/dL Final  05/10/2013 2.08* 0.50 - 1.35 mg/dL Final  05/10/2013 2.18* 0.50 - 1.35 mg/dL Final  05/10/2013 1.91* 0.50 - 1.35 mg/dL Final    #HTN - Normotensive currently. Amy Clegg's note from 3/4 mentions consideration of stopping the Clonidine and starting Spironolactone depending on his renal function. For now we will assume the regimen in Epic.  - Holding home Clonidine 0.1mg  BID given hypotension  #Weight loss - Weight decreased from 284lbs on 3/17 to 266lbs on admission. Could represent better control of his heart failure as he has been followed by a HF nurse via para-medicine starting in March, combined with dehydration. - Continue to monitor   #Schizophrenia/depression - Stable. Continue home trazodone 100 mg daily at bedtime, Abilify 10 mg daily at bedtime.   #History of hepatitis C - Ab reactive. No significant LFT abnormalities today.   #GERD - Protonix 40mg  BID.   #DVT PPX - Patient has a heparin allergy. SCDs for now.   Dispo: Disposition is deferred at this time, awaiting improvement of current medical problems.  Anticipated discharge in  approximately 2-4 day(s).   The patient does have a current PCP Dominic Pea, DO) and does need an [facility] hospital follow-up appointment after discharge.  The patient does not have transportation limitations that hinder transportation to clinic appointments.  .Services Needed at time of  discharge: Y = Yes, Blank = No PT:   OT:   RN:   Equipment:   Other:     LOS: 1 day   Lesly Dukes, MD 05/10/2013, 10:22 AM   Lesly Dukes, MD  [email or web address] Pager # [phone] After hours and weekends # [phone] Office # [phone]

## 2013-05-10 NOTE — Progress Notes (Signed)
Following second 500cc bolus BP continue to be in the 70-80/50s. MD paged. Awaiting response.   Lum Babe, RN

## 2013-05-10 NOTE — H&P (Signed)
Internal Medicine Attending Admission Note Date: 05/10/2013  Patient name: [name] [name] Medical record number: [id number] Date of birth: 30-Jul-1955 Age: 58 y.o. Gender: male  I saw and evaluated the patient. I reviewed the resident's note and I agree with the resident's findings and plan as documented in the resident's note, with the following additional comments.  Chief Complaint(s): High blood sugar; polyuria, polydipsia  History - key components related to admission: Patient is a 58 year old man with history of type 2 diabetes mellitus, combined systolic and diastolic congestive heart failure, hypertension, and other problems as outlined in the medical history admitted with reported high blood sugar noted by his heart failure nurse at home.  Patient also reports polyuria and polydipsia, with recent chills, fatigability, and nausea.  Physical Exam - key components related to admission:  Filed Vitals:   05/10/13 0530 05/10/13 0600 05/10/13 0700 05/10/13 1124  BP: 98/75 98/71 118/69   Pulse: 61 60 63   Temp:   97.5 F (36.4 C) 98.3 F (36.8 C)  TempSrc:   Oral Oral  Resp: 14 22 23    Height:      Weight:      SpO2: 97% 95% 96%     General: Alert, no distress Lungs: Clear Heart: Regular; no extra sounds or murmurs Abdomen: Bowel sounds present, soft, nontender Extremities: No edema  Lab results:   Basic Metabolic Panel:  Recent Labs  05/10/13 0135  05/10/13 0600 05/10/13 0715  NA 133*  < > 135* 134*  K 4.1  < > 4.7 4.6  CL 93*  < > 96 95*  CO2 23  < > 24 23  GLUCOSE 310*  < > 198* 180*  BUN 44*  < > 46* 44*  CREATININE 2.18*  < > 2.00* 1.91*  CALCIUM 10.4  < > 10.0 9.8  MG 2.2  --   --   --   < > = values in this interval not displayed.  Liver Function Tests:  Recent Labs  05/09/13 1603  AST 35  ALT 47  ALKPHOS 150*  BILITOT 0.4  PROT 8.8*  ALBUMIN 4.3     CBC:  Recent Labs  05/09/13 1603  WBC 12.1*  HGB 16.1  HCT 46.2  MCV 82.9  PLT 210      Cardiac Enzymes:  Recent Labs  05/09/13 2001 05/10/13 0135 05/10/13 0715  TROPONINI <0.30 <0.30 <0.30      CBG:  Recent Labs  05/10/13 0741 05/10/13 0845 05/10/13 0947 05/10/13 1052 05/10/13 1158 05/10/13 1300  GLUCAP 169* 147* 128* 115* 138* 170*     Urine Drug Screen: Drugs of Abuse     Component Value Date/Time   LABOPIA NONE DETECTED 05/09/2013 2129   LABOPIA NEG 05/09/2013 1704   COCAINSCRNUR POSITIVE* 05/09/2013 2129   COCAINSCRNUR PPS 05/09/2013 1704   LABBENZ NONE DETECTED 05/09/2013 2129   LABBENZ NEG 05/09/2013 1704   AMPHETMU NONE DETECTED 05/09/2013 2129   THCU NONE DETECTED 05/09/2013 2129   LABBARB NONE DETECTED 05/09/2013 2129   LABBARB NEG 05/09/2013 1704     Alcohol Level:  Recent Labs  05/09/13 1642  ETH <10    Urinalysis    Component Value Date/Time   COLORURINE YELLOW 05/09/2013 1704   APPEARANCEUR CLEAR 05/09/2013 1704   LABSPEC 1.030 05/09/2013 1704   PHURINE 5.0 05/09/2013 1704   GLUCOSEU 1000* 05/09/2013 1704   HGBUR NEG 05/09/2013 1704   BILIRUBINUR NEG 05/09/2013 1704   KETONESUR NEG 05/09/2013 [place]  NEG 05/09/2013 1704   UROBILINOGEN 0.2 05/09/2013 1704   NITRITE NEG 05/09/2013 1704   LEUKOCYTESUR NEG 05/09/2013 1704    Urine microscopic:  Recent Labs  05/09/13 1704  EPIU RARE  WBCU 0-2  RBCU NONE SEEN  BACTERIA NEGATIVE  LABCAST NEGATIVE     Imaging results:  Dg Chest 2 View  05/09/2013   CLINICAL DATA:  DKA with fevers  EXAM: CHEST  2 VIEW  COMPARISON:  CT ANGIO CHEST W/CM &/OR WO/CM dated 04/05/2013; DG CHEST 1V PORT dated 04/05/2013; DG CHEST 2 VIEW dated 02/12/2013  FINDINGS: The heart size and mediastinal contours are within normal limits. Both lungs are clear. The visualized skeletal structures are unremarkable.  IMPRESSION: No active cardiopulmonary disease.   Electronically Signed   By: Kathreen Devoid   On: 05/09/2013 22:23    Other results: EKG: Normal sinus rhythm; rightward axis; borderline  ECG  Assessment & Plan by Problem:  1.  Diabetic ketoacidosis.  Patient presents with marked hyperglycemia and evidence of mild DKA (elevated anion gap of 18, with bicarbonate of 21); the cause of his decompensation is not clear, given his recent hemoglobin A1c of 7.4.  Cocaine use may have been a continuing factor.  Patient also acknowledges that he has missed some doses of his metformin.  Plans include IV insulin with transition to subcutaneous regimen when DKA has corrected; IV fluids; follow metabolic panel and CBG closely.  Once his DKA has corrected, we will need to reconsider his home regimen; his heart failure and elevated lactic acid, as well as his renal insufficiency, would contraindicate metformin.  2.  Combined systolic and diastolic congestive heart failure.  His torsemide is on hold since we are replacing volume.  Patient has no signs or symptoms of decompensation; will need to be careful to avoid volume overload.  3.  Acute renal insufficiency.  This is likely due to volume depletion; plan is follow electrolytes, BUN, and creatinine as volume is replaced; hold ACE inhibitor for now.  4.  Other problems and plans as per the resident physician's note.  5.  Dr. Ellwood Dense will take over as attending physician tomorrow 05/11/2013.

## 2013-05-10 NOTE — Progress Notes (Addendum)
Patient c/o of shortness of breath, RR mid 20s-low 30s, placed on 2L [place], expiratory wheezes in LUL, diminished bases.  Also noted to be sweating, denies chest pain. MD notified and to the bedside to assess. EKG obtained. BP 70-80/40-50s. Verbal ordered obtained for 500cc bolus. Lab results discussed. No new orders received.  Will continue to monitor.   Lum Babe, RN

## 2013-05-10 NOTE — Progress Notes (Addendum)
Inpatient Diabetes Program Recommendations  AACE/ADA: New Consensus Statement on Inpatient Glycemic Control (2013)  Target Ranges:  Prepandial:   less than 140 mg/dL      Peak postprandial:   less than 180 mg/dL (1-2 hours)      Critically ill patients:  140 - 180 mg/dL   Results for [name] [name], [name] [name] (MRN [id number]) as of 05/10/2013 12:54  Ref. Range 05/09/2013 16:03  Glucose Latest Range: 70-99 mg/dL [facility])  Results for [name] [name], [name] [name] (MRN [id number]) as of 05/10/2013 12:54  Ref. Range 03/28/2013 10:57  Hemoglobin A1C Latest Range: <5.7 % 7.4   Results for [name] [name], [name] [name] (MRN [id number]) as of 05/10/2013 12:54  Ref. Range 05/10/2013 07:41 05/10/2013 08:45 05/10/2013 09:47 05/10/2013 10:52 05/10/2013 11:58  Glucose-Capillary Latest Range: 70-99 mg/dL 169 (H) 147 (H) 128 (H) 115 (H) 138 (H)   Diabetes history: DM (diagnosed in December 2014) Outpatient Diabetes medications: Metformin 500 mg BID Current orders for Inpatient glycemic control: Novolin R insulin drip via GlucoStabilizer  Inpatient Diabetes Program Recommendations Insulin - IV drip/GlucoStabilizer: Labs on 05/10/13 @ 7:15am indicated CO2 of 23 and AG of 16.  Since CO2 is 23, do not feel that AG related to diabetes but perhaps from some other source (lactic acid 2.5 on 05/09/13).  Therefore since CBGs have been in target range for 6 hours, recommend transitioning from IV insulin to SQ insulin at this time. Insulin - Basal: At time of transition from IV to SQ, recommend starting with Levemir 36 units Q24H (based on 121 k g x 0.3 units) starting now. Correction (SSI): At time of transition from IV to SQ, recommend ordering Novolog moderate correction scale Q4H. IV fluids:  At time of transition from IV to SQ, please re-evaluate dextrose in IV fluids. Diet: When diet is advanced, please consider ordering Carb Modified Diabetic diet.  05/10/13$RemoveBefor'@14'[id number]$ :47-Spoke with patient about diabetes and insulin. Patient reports that he was diagnosed  with DM about 3-4 months ago and was started on Metformin.  Patient states that he was not given a lot of education about diabetes but he was asked to go to a nutrition class.  Discussed A1C (7.4% on 03/28/13) results and explained what an A1C is, basic pathophysiology of DM Type 2, basic home care, importance of checking CBGs and maintaining good CBG control to prevent long-term and short-term complications. Have asked patient to review Living Well with Diabetes booklet when he receives and to watch patient education videos on diabetes and insulin administration. RNs to provide ongoing basic DM education at bedside with this patient and engage patient to actively check blood glucose and administer insulin injections. Have ordered RD consult, educational booklet, insulin starter kit, and DM videos. Educated patient on insulin pen use. Reviewed all steps of insulin pen including attachment of needle, 2-unit air shot, dialing up dose, giving injection, removing needle, disposal of sharps, storage of unused insulin, disposal of insulin etc. Patient able to provide successful return demonstration. Patient verbalized understanding of information discussed and reports that he does not have any further questions at this time related to diabetes.  Thanks, Barnie Alderman, RN, MSN, CCRN Diabetes Coordinator Inpatient Diabetes Program [phone] (Team Pager) [phone] ([facility] office) [phone] [facility] office)

## 2013-05-11 DIAGNOSIS — F1410 Cocaine abuse, uncomplicated: Secondary | ICD-10-CM

## 2013-05-11 LAB — GLUCOSE, CAPILLARY
GLUCOSE-CAPILLARY: 311 mg/dL — AB (ref 70–99)
Glucose-Capillary: 176 mg/dL — ABNORMAL HIGH (ref 70–99)
Glucose-Capillary: 250 mg/dL — ABNORMAL HIGH (ref 70–99)
Glucose-Capillary: 251 mg/dL — ABNORMAL HIGH (ref 70–99)
Glucose-Capillary: 212 mg/dL — ABNORMAL HIGH (ref 70–99)

## 2013-05-11 LAB — BASIC METABOLIC PANEL
BUN: 42 mg/dL — AB (ref 6–23)
BUN: 36 mg/dL — AB (ref 6–23)
CO2: 23 mEq/L (ref 19–32)
CO2: 23 mEq/L (ref 19–32)
Calcium: 9.7 mg/dL (ref 8.4–10.5)
Calcium: 9.8 mg/dL (ref 8.4–10.5)
Chloride: 95 mEq/L — ABNORMAL LOW (ref 96–112)
Chloride: 97 mEq/L (ref 96–112)
Creatinine, Ser: 1.55 mg/dL — ABNORMAL HIGH (ref 0.50–1.35)
Creatinine, Ser: 1.32 mg/dL (ref 0.50–1.35)
GFR calc Af Amer: 67 mL/min — ABNORMAL LOW (ref >90)
GFR calc non Af Amer: 47 mL/min — ABNORMAL LOW (ref >90)
GFR, EST AFRICAN AMERICAN: 55 mL/min — AB (ref >90)
GFR, EST NON AFRICAN AMERICAN: 57 mL/min — AB (ref >90)
GLUCOSE: 248 mg/dL — AB (ref 70–99)
Glucose, Bld: 221 mg/dL — ABNORMAL HIGH (ref 70–99)
POTASSIUM: 4.6 meq/L (ref 3.7–5.3)
Potassium: 4.6 mEq/L (ref 3.7–5.3)
SODIUM: 135 meq/L — AB (ref 137–147)
Sodium: 133 mEq/L — ABNORMAL LOW (ref 137–147)

## 2013-05-11 LAB — CBC
HEMATOCRIT: 41.6 % (ref 39.0–52.0)
HEMOGLOBIN: 13.7 g/dL (ref 13.0–17.0)
MCH: 27.8 pg (ref 26.0–34.0)
MCHC: 32.9 g/dL (ref 30.0–36.0)
MCV: 84.4 fL (ref 78.0–100.0)
Platelets: 182 10*3/uL (ref 150–400)
RBC: 4.93 MIL/uL (ref 4.22–5.81)
RDW: 15.4 % (ref 11.5–15.5)
WBC: 11.7 10*3/uL — AB (ref 4.0–10.5)

## 2013-05-11 LAB — LACTIC ACID, PLASMA: Lactic Acid, Venous: 1.3 mmol/L (ref 0.5–2.2)

## 2013-05-11 LAB — MRSA PCR SCREENING: MRSA by PCR: NEGATIVE

## 2013-05-11 MED ORDER — INSULIN ASPART 100 UNIT/ML ~~LOC~~ SOLN
0.0000 [IU] | Freq: Three times a day (TID) | SUBCUTANEOUS | Status: DC
  Administered 2013-05-11: 8 [IU] via SUBCUTANEOUS
  Administered 2013-05-11: 11 [IU] via SUBCUTANEOUS
  Administered 2013-05-12 (×2): 8 [IU] via SUBCUTANEOUS
  Administered 2013-05-12: 5 [IU] via SUBCUTANEOUS
  Administered 2013-05-13: 15 [IU] via SUBCUTANEOUS
  Administered 2013-05-13: 5 [IU] via SUBCUTANEOUS
  Administered 2013-05-13: 11 [IU] via SUBCUTANEOUS
  Administered 2013-05-14: 5 [IU] via SUBCUTANEOUS

## 2013-05-11 MED ORDER — CARVEDILOL 6.25 MG PO TABS
18.7500 mg | ORAL_TABLET | Freq: Two times a day (BID) | ORAL | Status: DC
  Administered 2013-05-11 – 2013-05-14 (×7): 18.75 mg via ORAL
  Filled 2013-05-11 (×11): qty 1

## 2013-05-11 NOTE — Discharge Summary (Signed)
Name: [name] [name] MRN: [id number] DOB: Dec 11, 1955 58 y.o. PCP: Dominic Pea, DO  Date of Admission: 05/09/2013  6:55 PM Date of Discharge: 05/15/2013 Attending Physician: Madilyn Fireman, MD  Discharge Diagnosis: Principal Problem:   DKA (diabetic ketoacidoses) Active Problems:   HTN (hypertension)   Chronic combined systolic and diastolic CHF (congestive heart failure)   CKD (chronic kidney disease) stage 2, GFR 60-89 ml/min   Diabetes mellitus, type 2   Leukocytosis  Discharge Medications:   Medication List    STOP taking these medications       cloNIDine 0.2 MG tablet  Commonly known as:  CATAPRES     metFORMIN 500 MG tablet  Commonly known as:  GLUCOPHAGE      TAKE these medications       ARIPiprazole 10 MG tablet  Commonly known as:  ABILIFY  Take 10 mg by mouth at bedtime.     carvedilol 12.5 MG tablet  Commonly known as:  COREG  Take 18.75 mg by mouth 2 (two) times daily with a meal.     insulin aspart 100 UNIT/ML injection  Commonly known as:  novoLOG  Inject 10 Units into the skin 3 (three) times daily with meals.     Insulin Detemir 100 UNIT/ML Pen  Commonly known as:  LEVEMIR FLEXTOUCH  Inject 55 Units into the skin daily at 10 pm.     lisinopril 5 MG tablet  Commonly known as:  PRINIVIL,ZESTRIL  Take 5mg  daily.     living well with diabetes book Misc  1 each by Does not apply route once.     oxyCODONE-acetaminophen 7.5-325 MG per tablet  Commonly known as:  PERCOCET  Take 1 tablet by mouth 3 (three) times daily.     oxymorphone 10 MG 12 hr tablet  Commonly known as:  OPANA ER  Take 10 mg by mouth at bedtime.     pantoprazole 40 MG tablet  Commonly known as:  PROTONIX  Take 40 mg by mouth 2 (two) times daily.     potassium chloride 10 MEQ tablet  Commonly known as:  K-DUR  Take 1 tablet (10 mEq total) by mouth 2 (two) times daily. Take extra 2 tabs when take Metolazone     rosuvastatin 20 MG tablet  Commonly known as:   CRESTOR  Take 20 mg by mouth daily.     torsemide 20 MG tablet  Commonly known as:  DEMADEX  Take 20mg  (two) times daily.     traZODone 100 MG tablet  Commonly known as:  DESYREL  Take 100 mg by mouth at bedtime.        Disposition and follow-up:   Mr.Aamari A Dieken was discharged from [facility] in Stable condition. At the hospital follow up visit please address:  1.  CBG control. Pt started on Insulin on this admission. CBgs hard to control on Lantus 55u and Novolog 10u with meals. Check if insulin regimen requires adjustment, and for any symptoms of hypoglycemia. Also Insulin compliance.   BP control- reintroduction of blood pressure meds. Was hypotensive during admission, blood pressure meds were held, and gradually introduced. Lisinopril was at 5mg  daily at discharge. Also torsemide- 20mg  daily on discharge, this is half his usual dose. See if his meds need up titration.   UDS positive for cocaine- pls counsel pt on stopping use.  2.  Labs / imaging needed at time of follow-up: BMP  3.  Pending labs/ test needing follow-up: None  Follow-up Appointments: Follow-up Information   Follow up with Blain Pais, MD On 05/19/2013. (@10 :45am for Hospital Follow up)    Specialty:  Internal Medicine   Contact information:   [address] [place] [address] [phone]       Follow up with [facility] On 05/15/2013. (@8 :11 AM. This is your neuro rehab appointment.)    Specialty:  Neuro-Rehabilitation   Contact information:   [address] [place] [id number] [place] [address] [phone]      Follow up with Lesly Dukes, MD On 05/17/2013. (@9 .15am)    Specialty:  Internal Medicine   Contact information:   [place] [address] [phone]       Discharge Instructions:  Future Appointments Provider Department Dept Phone   05/15/2013 8:45 AM [facility] Therapist [place] [phone]   05/19/2013 10:45 AM Blain Pais, MD [facility] [place] [phone]      Consultations:  None  Procedures Performed:  Dg Chest 2 View  05/09/2013   CLINICAL DATA:  DKA with fevers  EXAM: CHEST  2 VIEW  COMPARISON:  CT ANGIO CHEST W/CM &/OR WO/CM dated 04/05/2013; DG CHEST 1V PORT dated 04/05/2013; DG CHEST 2 VIEW dated 02/12/2013  FINDINGS: The heart size and mediastinal contours are within normal limits. Both lungs are clear. The visualized skeletal structures are unremarkable.  IMPRESSION: No active cardiopulmonary disease.   Electronically Signed   By: Kathreen Devoid   On: 05/09/2013 22:23    Admission HPI:  [name] [name] is a 58 y.o. male [facility] patient with PMH significant for DM2 with A1C 7.4 (03/28/13), chronic combined systolic and diastolic CHF (dx 0000000, 2D echo 10/27/12 EF [id number], grade 2 diastolic dysfunction, followed by Dr. Doylene Canard), HTN, schizophrenia, hepatitis C Ab positive, cerebellar strokes (dx 01/2012), cocaine abuse who presented to the [facility] with a chief complaint of high blood sugar.   Patient was seen by his heart failure nurse today at his home at around 2 PM when he was complaining of "not feeling well". She checked his CBG and the meter read "HI". The nurse called the clinic and the patient was brought to the clinic for further evaluation.   Patient reports that he has been having fatigue, blurry vision, chills, nausea, and frequent urination for the last 3 days. He never recorded his temperatures but he reports having intermittent fevers along with body pains. He reports that he used cocaine on Sunday (05/07/13), but denies any use of alcohol or any other recreational drugs.   He reports he has not been watching his diet recently and that he has been drinking "sugar drinks" (Kool-Aid). He denies any SOB, Chest pain, cough, head ache, vomiting, diarrhea. He denies any DOE, swelling of  legs, weight gain. He thinks he has lost weight, about 20lbs. He denies any dysuria.   In the clinic, patient was noted to be lethargic, falling to sleep while taking history. A repeat CBG in the clinic also read "HI".   Physical Exam:   Constitutional: He is oriented to person, place, and time and well-developed, well-nourished, and in no distress.  HENT:  Head: Normocephalic and atraumatic.  Eyes: Conjunctivae and EOM are normal. Pupils are equal, round, and reactive to light.  Neck: Normal range of motion. Neck supple.  Cardiovascular: Normal rate, regular rhythm and normal heart sounds. Exam reveals no gallop and no friction rub.  No murmur heard.  Pulmonary/Chest: Effort  normal and breath sounds normal. No respiratory distress. He has no wheezes. He has no rales. He exhibits no tenderness.  Abdominal: Soft. Bowel sounds are normal. He exhibits no distension and no mass. There is tenderness (Endorses diffuse mild tenderness). There is no rebound and no guarding.  Musculoskeletal: Normal range of motion. He exhibits no edema and no tenderness.  Neurological: He is alert and oriented to person, place, and time. GCS score is 15.  Appears sleepy, but participates in conversation.  Skin: Skin is warm and dry.  Psychiatric: Mood normal.  Distant affect.    Hospital Course by problem list: RAYANTHONY MOUSSA is a 58 y.o. male [facility] patient with PMH significant for DM2 with A1C 7.4 (03/28/13), chronic combined systolic and diastolic CHF (dx 0000000, 2D echo 10/27/12 EF [id number], grade 2 diastolic dysfunction, followed by Dr. Doylene Canard), HTN, schizophrenia, hepatitis C Ab positive, cerebellar strokes (dx 01/2012), cocaine abuse who presented to the [facility] with a chief complaint of high blood sugar.  1. DKA - Patient presented with hyperglycemia to 837 with polyuria, polydipsia, blurry vision, nausea, overall fatigue and lethargy x3 days. Anion gap was 18. Bicarb was 21. He has a history of type 2 diabetes  with most recent A1c 7.4 on 03/28/2013 (this is an increase from 5.8 on 02/20/2012). Was taking only Metformin at home. UDS was positive for cocaine and this was likely the trigger. Ischemic work up negative: Troponins negative x3, EKG showed right axis deviation with old incomplete LBBB. Infectious workup also negative: CXR no acute findings, UA clean, blood and urine cultures No growth. Lactate mildly elevated at 2.5, this down trended to normal with gentle IVF. He was placed on DKA protocol (gtt, IVFs, NPO) then transitioned to subcutaneous insulin on 4/15 once gap had closed. Cessation of cocaine use was discussed. Metformin was discontinued in this patient upon discharge considering his CHF and CKD. Blood sugars were hard to control during admission, insulin dose was gradually titrated up and was discharged on a regimen of Levemir- 55u and Novolog- 10 u with meals. The diabetes co-ordinator was consulted, and prior to discharge he was taught how to use his insulin. Was discharged with close hospital follow up in 2 days- 05/17/2013.  Glucose-Capillary  Date Value Ref Range Status  05/15/2013 241* 70 - 99 mg/dL Final  05/15/2013 212* 70 - 99 mg/dL Final  05/15/2013 152* 70 - 99 mg/dL Final  05/14/2013 294* 70 - 99 mg/dL Final  05/14/2013 297* 70 - 99 mg/dL Final  05/14/2013 378* 70 - 99 mg/dL Final  05/14/2013 262* 70 - 99 mg/dL Final  05/14/2013 224* 70 - 99 mg/dL Final  05/13/2013 274* 70 - 99 mg/dL Final  05/13/2013 354* 70 - 99 mg/dL Final    2. Hypotension, resolved - On the night of admission he was evaluated by the night team for lethargy, noted to be hypotensive to 79/50. Was given NS bolus 500cc x3 with improvement of blood pressures. Was thought to be due to a combination of dehydration from osmotic diuresis in the setting of DKA and blood pressure meds. Pt was gently hydrated as he has a hx of CHF. Bp was stable on discharge.  3. Chronic combined systolic and diastolic CHF - Last 2D echo 10/27/12  showed EF [id number], grade 2 diastolic dysfunction. He is followed by Dr. Doylene Canard and last saw Darrick Grinder on 03/29/2013. Goal weight is less than 276 lbs and he was significantly under this on admission. We held his home torsemide  40 BID while hydrating. Prior to discharge Home torsemide was restarted 20mg  BID, which is half his home dose   [facility] Weights   05/13/13 0500 05/14/13 0507 05/15/13 0546  Weight: 264 lb 12.4 oz (120.1 kg) 267 lb 14.4 oz (121.519 kg) 266 lb 3.2 oz (120.748 kg)    5. Hyponatremia, improving - 120 on admission. Corrected sodium for his hyperglycemia was 138. Trend below, improved as expected with control of hyperglycemia.   Sodium   Date  Value  Ref Range  Status   05/11/2013  135*  137 - 147 mEq/L  Final   05/11/2013  133*  137 - 147 mEq/L  Final   05/10/2013  133*  137 - 147 mEq/L  Final   05/10/2013  134*  137 - 147 mEq/L  Final   05/10/2013  134*  137 - 147 mEq/L  Final    6. Acute on chronic CKD stage 2 - Cr bumped to a high of 2.18 this admission, but trended down to his baseline. We held his home lisinopril 10 mg in am and 20 mg in pm while inpatient.  Creatinine, Ser   Date  Value  Ref Range  Status   05/11/2013  1.32  0.50 - 1.35 mg/dL  Final   05/11/2013  1.55*  0.50 - 1.35 mg/dL  Final   05/10/2013  1.47*  0.50 - 1.35 mg/dL  Final   05/10/2013  1.55*  0.50 - 1.35 mg/dL  Final   05/10/2013  1.91*  0.50 - 1.35 mg/dL  Final    7. HTN - Amy Clegg's note from 3/4 mentioned consideration of stopping the Clonidine and starting Spironolactone depending on his renal function. For now we assumed the regimen in Epic. We held his home Clonidine 0.1mg  BID given hypotension as above.  8. Weight loss - Weight decreased from 284lbs on 3/17 to 266lbs on admission. Could represent better control of his heart failure as he has been followed by a HF nurse via para-medicine starting in March, combined with dehydration in the setting of DKA. Dietician saw the patient, no interventions  warranted.  9. Schizophrenia/depression - Stable. Continued home trazodone 100 mg daily at bedtime, Abilify 10 mg daily at bedtime.   10. History of hepatitis C - Ab reactive. No significant LFT abnormalities here.  11. GERD - Continued Protonix 40mg  BID.   Discharge Vitals:   BP 104/51  Pulse 76  Temp(Src) 98.2 F (36.8 C) (Oral)  Resp 18  Ht 5\' 11"  (1.803 m)  Wt 266 lb 3.2 oz (120.748 kg)  BMI 37.14 kg/m2  SpO2 95%  Discharge Labs:  No results found for this or any previous visit (from the past 24 hour(s)).  Time Spent on Discharge: 40 minutes Services Ordered on Discharge: None Equipment Ordered on Discharge: None

## 2013-05-11 NOTE — Care Management Note (Unsigned)
    Page 1 of 1   05/11/2013     4:29:16 PM   CARE MANAGEMENT NOTE 05/11/2013  Patient:  [name] [name], [name] [name]   Account Number:  [id number]  Date Initiated:  05/11/2013  Documentation initiated by:  GRAVES-BIGELOW,Camyla Camposano  Subjective/Objective Assessment:   Pt admitted for elevated blood sugar. Plan to be d/c on insulin pen.     Action/Plan:   Benefits check in progress and will make pt aware once completed. CM will continue to monitor for disposition needs.   Anticipated DC Date:  05/12/2013   Anticipated DC Plan:  [place]  CM consult      Choice offered to / List presented to:             Status of service:  In process, will continue to follow Medicare Important Message given?   (If response is "NO", the following Medicare IM given date fields will be blank) Date Medicare IM given:   Date Additional Medicare IM given:    Discharge Disposition:    Per UR Regulation:  Reviewed for med. necessity/level of care/duration of stay  If discussed at [place] of Stay Meetings, dates discussed:    Comments:

## 2013-05-11 NOTE — Progress Notes (Signed)
    Day 2 of stay      Patient name: [name] [name]  Medical record number: [id number]  Date of birth: 1955/08/13  58 year old gentleman admitted from [facility] clinic with DKA. He has done much better, shown clinical improvement and has already been transitioned to [place] insulin and diet yesterday. Today, I examined him with my inpatient team. He appears to be doing well clinically - alert, oriented, systemic exam unremarkable for any significantly acute findings - abdomen is non-tender and soft. He has been tolerating diet well.    Recent Labs Lab 05/10/13 0135  05/10/13 0715 05/10/13 1405 05/10/13 1645 05/11/13 0240 05/11/13 0800  NA 133*  < > 134* 134* 133* 133* 135*  K 4.1  < > 4.6 4.5 4.3 4.6 4.6  CL 93*  < > 95* 99 98 95* 97  CO2 23  < > 23 21 23 23 23   GLUCOSE 310*  < > 180* 160* 160* 248* 221*  BUN 44*  < > 44* 44* 42* 42* 36*  CREATININE 2.18*  < > 1.91* 1.55* 1.47* 1.55* 1.32  CALCIUM 10.4  < > 9.8 9.8 9.8 9.7 9.8  MG 2.2  --   --   --   --   --   --   < > = values in this interval not displayed. Anion gap remains closed.   Recent Labs Lab 05/10/13 2113 05/10/13 2353 05/11/13 0425 05/11/13 0815 05/11/13 1126  GLUCAP 215* 176* 250* 212* 311*   His CBGs remain morderately elevated with sensitive insulin SS and we will aim to adjust insulin dose to target CBGs of 140-180. We will calculate his home insulin dosage based on the amount he requires in the hospital. He has been on metformin at home, which given history of CHF and recent lactic acidosis, also ongoing cocaine use, may not be a wise choice for him any more. We will not continue metformin on discharge.  Also, the patient seems to have been normotensive without his BP medications (had a phase of hypotension yesterday), thus it might be wise to start them one by one, to avoid making him hypotensive. We can start coreg today and observe him on it while he is here.   I have discussed the care of this patient with my  resident team and I agree with their plan. Please see resident note by Dr Lucila Maine for details.   [name] 05/11/2013, 12:39 PM.

## 2013-05-11 NOTE — Plan of Care (Signed)
Problem: Food- and Nutrition-Related Knowledge Deficit (NB-1.1) Goal: Nutrition education Formal process to instruct or train a patient/client in a skill or to impart knowledge to help patients/clients voluntarily manage or modify food choices and eating behavior to maintain or improve health.  Outcome: Completed/Met Date Met:  05/11/13  Brief Nutrition Note  RD consulted for nutrition education regarding diabetes. Pt with limited engagement during education. Pt reports that he has been to a DM class by an RD in the past. Pt stated that they didn't go over "carbohydrates" during that time. Recommend continued follow-up with RD at [facility]. Expect poor compliance.    Lab Results  Component Value Date    HGBA1C 7.4 03/28/2013    RD provided "Carbohydrate Counting for People with Diabetes" handout from the Academy of Nutrition and Dietetics. Discussed different food groups and their effects on blood sugar, emphasizing carbohydrate-containing foods. Provided list of carbohydrates and recommended serving sizes of common foods.  Discussed importance of controlled and consistent carbohydrate intake throughout the day. Provided examples of ways to balance meals/snacks and encouraged intake of high-fiber, whole grain complex carbohydrates. Teach back method used.  Expect poor compliance.  Body mass index is 37.87 kg/(m^2). Pt meets criteria for Obese Class II based on current BMI.  Current diet order is Carbohydrate Modified Medium, patient is consuming approximately >50% of meals at this time. Labs and medications reviewed. No further nutrition interventions warranted at this time. RD contact information provided. If additional nutrition issues arise, please re-consult RD.    MS, RD, LDN Inpatient Registered Dietitian Pager: [phone] After-hours pager: [phone]         

## 2013-05-11 NOTE — Progress Notes (Signed)
Spoke with patient and reviewed insulin pen administration.  Was accurate in drawing up a practice dose on an insulin pen.  Staff RNs can have patient watch video #511 for further instruction.  Will be followed by internal medicine clinic as outpatient.  Will continue to follow while in hospital.  Harvel Ricks RN BSN CDE

## 2013-05-11 NOTE — Progress Notes (Signed)
CBGs continue to be greater than 180 mg/dl.  Patient on CHO Modified Moderate diet now.  May want to consider changing Novolog MODERATE correction scale to [facility] and HS.  Will continue to monitor blood sugars. Harvel Ricks RN BSN CDE

## 2013-05-11 NOTE — Progress Notes (Signed)
Subjective: Patient seen and examined at the bedside this morning. He was transitioned to subq insulin yesterday afternoon. He feels well, almost back to normal, just a bit tired. He is amenable to staying one more day to help [place] titrate his insulin.  Objective: Vital signs in last 24 hours: Filed Vitals:   05/10/13 2355 05/11/13 0425 05/11/13 0814 05/11/13 0818  BP: 132/74 130/75 123/71   Pulse: 68 77 66   Temp: 98.4 F (36.9 C) 98.3 F (36.8 C)  97.7 F (36.5 C)  TempSrc: Oral Oral  Oral  Resp: _0 Height:      Weight:  271 lb 6.2 oz (123.1 kg)    SpO2: 95% 97% 98%    Weight change: 3 lb 6.2 oz (1.536 kg)  Intake/Output Summary (Last 24 hours) at 05/11/13 1109 Last data filed at 05/11/13 0818  Gross per 24 hour  Intake    585 ml  Output   1250 ml  Net   -665 ml   Physical Exam  Constitutional: He is oriented to person, place, and time and well-developed, well-nourished, and in no distress.  HENT:  Head: Normocephalic and atraumatic.  Eyes: Conjunctivae and EOM are normal. Pupils are equal, round, and reactive to light.  Neck: Normal range of motion. Neck supple.  Cardiovascular: Normal rate, regular rhythm and normal heart sounds. Exam reveals no gallop and no friction rub.  No murmur heard.  Pulmonary/Chest: Effort normal and breath sounds normal. No respiratory distress. He has no wheezes. He has no rales. He exhibits no tenderness.  Abdominal: Soft. Bowel sounds are normal. He exhibits no distension and no mass. Non tender. There is no rebound and no guarding.  Musculoskeletal: Normal range of motion. He exhibits no edema and no tenderness.  Neurological: He is alert and oriented to person, place, and time. GCS score is 15.  Skin: Skin is warm and dry.  Psychiatric: Mood normal.   Distant affect.   Lab Results: Basic Metabolic Panel:  Recent Labs Lab 05/10/13 0135  05/11/13 0240 05/11/13 0800  NA 133*  < > 133* 135*  K 4.1  < > 4.6 4.6  CL 93*  <  > 95* 97  CO2 23  < > 23 23  GLUCOSE 310*  < > 248* 221*  BUN 44*  < > 42* 36*  CREATININE 2.18*  < > 1.55* 1.32  CALCIUM 10.4  < > 9.7 9.8  MG 2.2  --   --   --   < > = values in this interval not displayed. Liver Function Tests:  Recent Labs Lab 05/09/13 1603  AST 35  ALT 47  ALKPHOS 150*  BILITOT 0.4  PROT 8.8*  ALBUMIN 4.3   CBC:  Recent Labs Lab 05/09/13 1603 05/11/13 0240  WBC 12.1* 11.7*  HGB 16.1 13.7  HCT 46.2 41.6  MCV 82.9 84.4  PLT 210 182   Cardiac Enzymes:  Recent Labs Lab 05/09/13 2001 05/10/13 0135 05/10/13 0715  TROPONINI <0.30 <0.30 <0.30   CBG:  Recent Labs Lab 05/10/13 1614 05/10/13 2023 05/10/13 2113 05/10/13 2353 05/11/13 0425 05/11/13 0815  GLUCAP 148* 251* 215* 176* 250* 212*   Urine Drug Screen: Drugs of Abuse     Component Value Date/Time   LABOPIA NONE DETECTED 05/09/2013 2129   LABOPIA NEG 05/09/2013 1704   COCAINSCRNUR POSITIVE* 05/09/2013 2129   COCAINSCRNUR PPS 05/09/2013 1704   LABBENZ NONE DETECTED 05/09/2013 2129   LABBENZ NEG 05/09/2013 1704  AMPHETMU NONE DETECTED 05/09/2013 2129   THCU NONE DETECTED 05/09/2013 2129   LABBARB NONE DETECTED 05/09/2013 2129   LABBARB NEG 05/09/2013 1704    Alcohol Level:  Recent Labs Lab 05/09/13 1642  ETH <10   Urinalysis:  Recent Labs Lab 05/09/13 1704  COLORURINE YELLOW  LABSPEC 1.030  PHURINE 5.0  GLUCOSEU 1000*  HGBUR NEG  BILIRUBINUR NEG  KETONESUR NEG  PROTEINUR NEG  UROBILINOGEN 0.2  NITRITE NEG  LEUKOCYTESUR NEG    Micro Results: Recent Results (from the past 240 hour(s))  CULTURE, BLOOD (SINGLE)     Status: None   Collection Time    05/09/13  4:42 PM      Result Value Ref Range Status   Preliminary Report Blood Culture received; No Growth to date;   Preliminary   Preliminary Report Culture will be held for 5 days before issuing   Preliminary   Preliminary Report a Final Negative report.   Preliminary  CULTURE, BLOOD (SINGLE)     Status: None    Collection Time    05/09/13  4:50 PM      Result Value Ref Range Status   Preliminary Report Blood Culture received; No Growth to date;   Preliminary   Preliminary Report Culture will be held for 5 days before issuing   Preliminary   Preliminary Report a Final Negative report.   Preliminary  URINE CULTURE     Status: None   Collection Time    05/09/13  5:04 PM      Result Value Ref Range Status   Colony Count NO GROWTH   Final   Organism ID, Bacteria NO GROWTH   Final  MRSA PCR SCREENING     Status: None   Collection Time    05/11/13  7:35 AM      Result Value Ref Range Status   MRSA by PCR NEGATIVE  NEGATIVE Final   Comment:            The GeneXpert MRSA Assay (FDA     approved for NASAL specimens     only), is one component of a     comprehensive MRSA colonization     surveillance program. It is not     intended to diagnose MRSA     infection nor to guide or     monitor treatment for     MRSA infections.   Studies/Results: Dg Chest 2 View  05/09/2013   CLINICAL DATA:  DKA with fevers  EXAM: CHEST  2 VIEW  COMPARISON:  CT ANGIO CHEST W/CM &/OR WO/CM dated 04/05/2013; DG CHEST 1V PORT dated 04/05/2013; DG CHEST 2 VIEW dated 02/12/2013  FINDINGS: The heart size and mediastinal contours are within normal limits. Both lungs are clear. The visualized skeletal structures are unremarkable.  IMPRESSION: No active cardiopulmonary disease.   Electronically Signed   By: Kathreen Devoid   On: 05/09/2013 22:23   Medications: I have reviewed the patient's current medications. Scheduled Meds: . ARIPiprazole  10 mg Oral QHS  . insulin aspart  0-15 Units Subcutaneous TID WC  . insulin detemir  36 Units Subcutaneous Daily  . Insulin Pen Starter Kit  1 kit Other Once  . oxyCODONE-acetaminophen  1.5 tablet Oral TID  . pantoprazole  40 mg Oral BID  . traZODone  100 mg Oral QHS   Continuous Infusions:   PRN Meds:.dextrose, morphine injection  Assessment/Plan: [name] [name] is a 58 y.o. male  [facility] patient with PMH significant for DM2  with A1C 7.4 (03/28/13), chronic combined systolic and diastolic CHF (dx 15/4008, 2D echo 10/27/12 EF 67-61%, grade 2 diastolic dysfunction, followed by Dr. Doylene Canard), HTN, schizophrenia, hepatitis C Ab positive, cerebellar strokes (dx 01/2012), cocaine abuse who presented to the [facility] with a chief complaint of high blood sugar.   #DKA - UDS positive for cocaine and this is likely the trigger. Ischemic and infectious work ups have been negative. Lactate is wnl this am. Wilburn Mylar he was transitioned to subcutaneous insulin. Gap is 15 this am. - Appreciate diabetes educator recs - Levemir 36 units Q24H (based on 121 k g x 0.3 units)  - Novolog moderate correction scale AC - CBG q4h  - Stop IVF - Monitor K and replete as necessary  - Blood cultures x2 > NGTD - Urine culture > No growth - Pain control with home Percocet 7.5-325 TID  - We have discussed cocaine cessation - Observe CBGs overnight for insulin titration and anticipate discharge tomorrow - Metformin will need to be discontinued in this patient upon discharge, it would be contraindicated given his CHF and CKD  Glucose-Capillary  Date Value Ref Range Status  05/11/2013 212* 70 - 99 mg/dL Final  05/11/2013 250* 70 - 99 mg/dL Final  05/10/2013 176* 70 - 99 mg/dL Final  05/10/2013 215* 70 - 99 mg/dL Final  05/10/2013 251* 70 - 99 mg/dL Final    #Hypotension, resolved - He is normotensive currently. - Holding home clonidine and Coreg  #Hypoxia, resolved - Currently saturating well on room air. - Closely monitor oxygen saturations given CHF and IVF  #Chronic combined systolic and diastolic CHF - Last 2D echo 10/27/12 showed EF 95-09%, grade 2 diastolic dysfunction. He is followed by Dr. Doylene Canard and last saw Darrick Grinder on 03/29/2013. Goal weight is less than 276 lbs.  - Strict I/O > UOP 1050 yesterday, net +2385 - Daily weights > Trending up with IVF but still within goal  - Holding home carvedilol 18.75 mg  twice a day with meals given hypotension - Holding home torsemide 40 BID while hydrating  - Monitor volume status closely   Filed Weights   05/09/13 2009 05/10/13 0330 05/11/13 0425  Weight: 264 lb 12.4 oz (120.1 kg) 268 lb 4.8 oz (121.7 kg) 271 lb 6.2 oz (123.1 kg)    #Hyponatremia, improving - Trend below, improved as expected with control of hyperglycemia. - IVF, insulin as above   Sodium  Date Value Ref Range Status  05/11/2013 135* 137 - 147 mEq/L Final  05/11/2013 133* 137 - 147 mEq/L Final  05/10/2013 133* 137 - 147 mEq/L Final  05/10/2013 134* 137 - 147 mEq/L Final  05/10/2013 134* 137 - 147 mEq/L Final    #Acute on chronic CKD stage 2 - Cr bumped to a high of 2.18 this admission, but has trended down to his baseline.  - Holding home lisinopril 10 mg in am and 20 mg in pm  - Continue to monitor  Creatinine, Ser  Date Value Ref Range Status  05/11/2013 1.32  0.50 - 1.35 mg/dL Final  05/11/2013 1.55* 0.50 - 1.35 mg/dL Final  05/10/2013 1.47* 0.50 - 1.35 mg/dL Final  05/10/2013 1.55* 0.50 - 1.35 mg/dL Final  05/10/2013 1.91* 0.50 - 1.35 mg/dL Final    #HTN - Normotensive currently. Amy Clegg's note from 3/4 mentions consideration of stopping the Clonidine and starting Spironolactone depending on his renal function. For now we will assume the regimen in Epic.  - Holding home Clonidine 0.$RemoveBeforeDE'1mg'HpbDPrstUELcIDQ$  BID given  hypotension  #Weight loss - Weight decreased from 284lbs on 3/17 to 266lbs on admission. Could represent better control of his heart failure as he has been followed by a HF nurse via para-medicine starting in March, combined with dehydration. - Appreciate nutrition recs, no interventions warranted - Continue to monitor   #Schizophrenia/depression - Stable. Continue home trazodone 100 mg daily at bedtime, Abilify 10 mg daily at bedtime.   #History of hepatitis C - Ab reactive. No significant LFT abnormalities today.   #GERD - Protonix 7m BID.   #DVT PPX - Patient has a heparin  allergy. SCDs for now.   Dispo: Disposition is deferred at this time, awaiting improvement of current medical problems.  Anticipated discharge in approximately 2-4 day(s).   The patient does have a current PCP (Dominic Pea DO) [facility].  The patient does not have transportation limitations that hinder transportation to clinic appointments.  .Services Needed at time of discharge: Y = Yes, Blank = No PT:   OT:   RN:   Equipment:   Other:     LOS: 2 days   SLesly Dukes MD 05/11/2013, 11:09 AM   SLesly Dukes MD  [email or web address] Pager # [phone] hours and weekends # [phone] # [phone]

## 2013-05-11 NOTE — Progress Notes (Signed)
Pt gave self injection of novolog, 11 units before lunch. RN educated patient on BD insulin starter kit. Pt pulled up insulin correctly. No complications, tolerated well. Pt interested in learning about DM and treatment, currently reading booklet. Will continue to educate patient. Plan to have pt get own BG prior to dinner. Will continue to monitor.

## 2013-05-12 ENCOUNTER — Other Ambulatory Visit: Payer: Self-pay | Admitting: Internal Medicine

## 2013-05-12 LAB — GLUCOSE, CAPILLARY
GLUCOSE-CAPILLARY: 295 mg/dL — AB (ref 70–99)
GLUCOSE-CAPILLARY: 262 mg/dL — AB (ref 70–99)
GLUCOSE-CAPILLARY: 254 mg/dL — AB (ref 70–99)
GLUCOSE-CAPILLARY: 280 mg/dL — AB (ref 70–99)
Glucose-Capillary: 292 mg/dL — ABNORMAL HIGH (ref 70–99)
Glucose-Capillary: 216 mg/dL — ABNORMAL HIGH (ref 70–99)
Glucose-Capillary: 286 mg/dL — ABNORMAL HIGH (ref 70–99)

## 2013-05-12 LAB — BASIC METABOLIC PANEL
BUN: 26 mg/dL — ABNORMAL HIGH (ref 6–23)
CO2: 22 meq/L (ref 19–32)
Calcium: 9.9 mg/dL (ref 8.4–10.5)
Chloride: 100 mEq/L (ref 96–112)
Creatinine, Ser: 1.14 mg/dL (ref 0.50–1.35)
GFR calc Af Amer: 80 mL/min — ABNORMAL LOW (ref >90)
GFR calc non Af Amer: 69 mL/min — ABNORMAL LOW (ref >90)
GLUCOSE: 257 mg/dL — AB (ref 70–99)
POTASSIUM: 4.9 meq/L (ref 3.7–5.3)
Sodium: 133 mEq/L — ABNORMAL LOW (ref 137–147)

## 2013-05-12 MED ORDER — TORSEMIDE 20 MG PO TABS
20.0000 mg | ORAL_TABLET | Freq: Two times a day (BID) | ORAL | Status: DC
Start: 2013-05-12 — End: 2013-05-15
  Administered 2013-05-12 – 2013-05-15 (×8): 20 mg via ORAL
  Filled 2013-05-12 (×10): qty 1

## 2013-05-12 MED ORDER — POLYETHYLENE GLYCOL 3350 17 G PO PACK
17.0000 g | PACK | Freq: Two times a day (BID) | ORAL | Status: DC
  Administered 2013-05-12 – 2013-05-15 (×5): 17 g via ORAL
  Filled 2013-05-12 (×10): qty 1

## 2013-05-12 MED ORDER — INSULIN DETEMIR 100 UNIT/ML ~~LOC~~ SOLN
42.0000 [IU] | Freq: Every day | SUBCUTANEOUS | Status: DC
  Administered 2013-05-12: 42 [IU] via SUBCUTANEOUS
  Filled 2013-05-12 (×2): qty 0.42

## 2013-05-12 NOTE — Progress Notes (Signed)
Subjective: Sitting on recliner at bedside, comfortable. Says he will like to restart his torsemide, as he feels his tummy is a little full. Say he had some SOB earlier but that this has resolved. Breathing comfortable. Feels back to baseline. Pt says he can take injectable insulin, as he will no longer be continuing with his metformin.  Objective: Vital signs in last 24 hours: Filed Vitals:   05/12/13 0000 05/12/13 0343 05/12/13 0700 05/12/13 0818  BP:  134/82  142/88  Pulse:    69  Temp: 98.2 F (36.8 C) 98.5 F (36.9 C) 98.3 F (36.8 C)   TempSrc: Oral Oral Oral   Resp:  18    Height:      Weight:   269 lb 6.4 oz (122.2 kg)   SpO2:  95%     Weight change: -1 lb 15.8 oz (-0.9 kg)  Intake/Output Summary (Last 24 hours) at 05/12/13 1110 Last data filed at 05/12/13 0900  Gross per 24 hour  Intake    600 ml  Output   2525 ml  Net  -1925 ml   Physical Exam-  GENERAL- alertnot in any distress. HEENT- Atraumatic, normocephalic, PERRL, EOMI, oral mucosa appears moist.  CARDIAC- RRR, no murmurs, rubs or gallops. RESP- Moving equal volumes of air, and clear to auscultation bilaterally. ABDOMEN- Soft,non tender,  bowel sounds present. NEURO- No obvious Cr N abnormality, strenght equal and present in all extremities. EXTREMITIES- No pedal edema.Marland Kitchen SKIN- Warm, dry, No rash or lesion.  Lab Results: Basic Metabolic Panel:  Recent Labs Lab 05/10/13 0135  05/11/13 0800 05/12/13 0320  NA 133*  < > 135* 133*  K 4.1  < > 4.6 4.9  CL 93*  < > 97 100  CO2 23  < > 23 22  GLUCOSE 310*  < > 221* 257*  BUN 44*  < > 36* 26*  CREATININE 2.18*  < > 1.32 1.14  CALCIUM 10.4  < > 9.8 9.9  MG 2.2  --   --   --   < > = values in this interval not displayed. Liver Function Tests:  Recent Labs Lab 05/09/13 1603  AST 35  ALT 47  ALKPHOS 150*  BILITOT 0.4  PROT 8.8*  ALBUMIN 4.3   CBC:  Recent Labs Lab 05/09/13 1603 05/11/13 0240  WBC 12.1* 11.7*  HGB 16.1 13.7  HCT 46.2  41.6  MCV 82.9 84.4  PLT 210 182   Cardiac Enzymes:  Recent Labs Lab 05/09/13 2001 05/10/13 0135 05/10/13 0715  TROPONINI <0.30 <0.30 <0.30   CBG:  Recent Labs Lab 05/11/13 0815 05/11/13 1126 05/11/13 1627 05/11/13 2018 05/12/13 0343 05/12/13 0834  GLUCAP 212* 311* 292* 295* 262* 216*   Urine Drug Screen: Drugs of Abuse     Component Value Date/Time   LABOPIA NONE DETECTED 05/09/2013 2129   LABOPIA NEG 05/09/2013 1704   COCAINSCRNUR POSITIVE* 05/09/2013 2129   COCAINSCRNUR PPS 05/09/2013 1704   LABBENZ NONE DETECTED 05/09/2013 2129   LABBENZ NEG 05/09/2013 1704   AMPHETMU NONE DETECTED 05/09/2013 2129   THCU NONE DETECTED 05/09/2013 2129   LABBARB NONE DETECTED 05/09/2013 2129   LABBARB NEG 05/09/2013 1704    Alcohol Level:  Recent Labs Lab 05/09/13 1642  ETH <10   Urinalysis:  Recent Labs Lab 05/09/13 1704  COLORURINE YELLOW  LABSPEC 1.030  PHURINE 5.0  GLUCOSEU 1000*  HGBUR NEG  BILIRUBINUR NEG  KETONESUR NEG  PROTEINUR NEG  UROBILINOGEN 0.2  NITRITE NEG  LEUKOCYTESUR NEG    Micro Results: Recent Results (from the past 240 hour(s))  CULTURE, BLOOD (SINGLE)     Status: None   Collection Time    05/09/13  4:42 PM      Result Value Ref Range Status   Preliminary Report Blood Culture received; No Growth to date;   Preliminary   Preliminary Report Culture will be held for 5 days before issuing   Preliminary   Preliminary Report a Final Negative report.   Preliminary  CULTURE, BLOOD (SINGLE)     Status: None   Collection Time    05/09/13  4:50 PM      Result Value Ref Range Status   Preliminary Report Blood Culture received; No Growth to date;   Preliminary   Preliminary Report Culture will be held for 5 days before issuing   Preliminary   Preliminary Report a Final Negative report.   Preliminary  URINE CULTURE     Status: None   Collection Time    05/09/13  5:04 PM      Result Value Ref Range Status   Colony Count NO GROWTH   Final   Organism  ID, Bacteria NO GROWTH   Final  MRSA PCR SCREENING     Status: None   Collection Time    05/11/13  7:35 AM      Result Value Ref Range Status   MRSA by PCR NEGATIVE  NEGATIVE Final   Comment:            The GeneXpert MRSA Assay (FDA     approved for NASAL specimens     only), is one component of a     comprehensive MRSA colonization     surveillance program. It is not     intended to diagnose MRSA     infection nor to guide or     monitor treatment for     MRSA infections.   Studies/Results: No results found. Medications: I have reviewed the patient's current medications. Scheduled Meds: . ARIPiprazole  10 mg Oral QHS  . carvedilol  18.75 mg Oral BID WC  . insulin aspart  0-15 Units Subcutaneous TID WC  . insulin detemir  42 Units Subcutaneous Daily  . Insulin Pen Starter Kit  1 kit Other Once  . oxyCODONE-acetaminophen  1.5 tablet Oral TID  . pantoprazole  40 mg Oral BID  . polyethylene glycol  17 g Oral BID  . torsemide  20 mg Oral BID  . traZODone  100 mg Oral QHS   Continuous Infusions:   PRN Meds:.dextrose, morphine injection  Assessment/Plan:  #DKA - UDS positive for cocaine- likely the trigger. Ischemic and infectious work ups- negative. Lactate is wnl. Now on S/c insulin- Levemir and Novolog.  - Appreciate diabetes educator recs - Increase Levemir 36 units Q24H (based on 121 k g x 0.3 units) to 42 u daily. - Novolog moderate correction scale AC - CBG q4h  - On Carb modified diet  - Blood cultures x2 > NGTD - Urine culture > No growth - Pain control with home Percocet 5-325 TID - Observe CBGs overnight for insulin titration and anticipate discharge tomorrow - Metformin will need to be discontinued in this patient upon discharge, it would be contraindicated given his CHF and CKD  Glucose-Capillary  Date Value Ref Range Status  05/12/2013 216* 70 - 99 mg/dL Final  05/12/2013 262* 70 - 99 mg/dL Final  05/11/2013 295* 70 - 99 mg/dL Final  05/11/2013 292* 70 -  99  mg/dL Final  05/11/2013 311* 70 - 99 mg/dL Final    #Hypotension, resolved - BP at goal. - Coreg restarted at 18.$RemoveBeforeD'75mg'TeLaafdYNDoDVK$  BID, home dose. - Holding home clonidine.  #Hypoxia, resolved - Currently saturating well on room air. - Closely monitor oxygen saturations given CHF and IVF  #Chronic combined systolic and diastolic CHF - Last 2D echo 10/27/12 showed EF 09-32%, grade 2 diastolic dysfunction. He is followed by Dr. Doylene Canard and last saw Darrick Grinder on 03/29/2013. Goal weight is less than 276 lbs, stable on admission.  - Strict I/O > UOP 1050 yesterday, net +760 since admission.  - Will restart home torsemide at $RemoveBefo'20mg'mVZVCrzhSPp$  BID, which is half the home dose. - Monitor volume status closely   Filed Weights   05/10/13 0330 05/11/13 0425 05/12/13 0700  Weight: 268 lb 4.8 oz (121.7 kg) 271 lb 6.2 oz (123.1 kg) 269 lb 6.4 oz (122.2 kg)    #Hyponatremia, improving - Trend below, improved as expected with control of hyperglycemia. - IVF, insulin as above   Sodium  Date Value Ref Range Status  05/12/2013 133* 137 - 147 mEq/L Final  05/11/2013 135* 137 - 147 mEq/L Final  05/11/2013 133* 137 - 147 mEq/L Final  05/10/2013 133* 137 - 147 mEq/L Final  05/10/2013 134* 137 - 147 mEq/L Final    #Acute on chronic CKD stage 2 - Cr bumped to a high of 2.18 this admission, but has trended down to his baseline.  - Holding home lisinopril 10 mg in am and 20 mg in pm  - Continue to monitor  Creatinine, Ser  Date Value Ref Range Status  05/12/2013 1.14  0.50 - 1.35 mg/dL Final  05/11/2013 1.32  0.50 - 1.35 mg/dL Final  05/11/2013 1.55* 0.50 - 1.35 mg/dL Final  05/10/2013 1.47* 0.50 - 1.35 mg/dL Final  05/10/2013 1.55* 0.50 - 1.35 mg/dL Final    #HTN - Normotensive currently. Amy Clegg's note from 3/4 mentions consideration of stopping the Clonidine and starting Spironolactone depending on his renal function. For now we will assume the regimen in Epic.  - Holding home Clonidine 0.$RemoveBeforeDE'1mg'rOyGiWrwRhbMmph$  BID given hypotension  #Weight loss  - Weight decreased from 284lbs on 3/17 to 266lbs on admission. Could represent better control of his heart failure as he has been followed by a HF nurse via para-medicine starting in March, combined with dehydration. - Appreciate nutrition recs, no interventions warranted - Continue to monitor   #Schizophrenia/depression - Stable. Continue home trazodone 100 mg daily at bedtime, Abilify 10 mg daily at bedtime.   #History of hepatitis C - Ab reactive. No significant LFT abnormalities.   #GERD - Protonix $RemoveBe'40mg'MnLckdVsT$  BID.   #DVT PPX - Patient has a heparin allergy. SCDs for now.   Dispo: Disposition is deferred at this time, awaiting improvement of current medical problems.  Anticipated discharge in approximately 2-4 day(s).   The patient does have a current PCP Dominic Pea, DO) and does need an [facility] hospital follow-up appointment after discharge.  The patient does not have transportation limitations that hinder transportation to clinic appointments.  .Services Needed at time of discharge: Y = Yes, Blank = No PT:   OT:   RN:   Equipment:   Other:     LOS: 3 days   Jenetta Downer, MD 05/12/2013, 11:10 AM

## 2013-05-12 NOTE — Progress Notes (Signed)
Patient has been transferred to 6N bed 18 via wheelchair. Phone report has been called to [place]. Patient is awsare of the transfer and new room number.

## 2013-05-12 NOTE — Progress Notes (Signed)
    Day 3 of stay      Patient name: [name] [name]  Medical record number: [id number]  Date of birth: May 25, 1955  Doing well. Complained of SOB this morning but feels better now. Blood sugars remain elevated in 200s. Lungs were clear to auscultation on my exam and there is no JVD or pedal edema. I discussed with my team about the management plan and we decided to go up on his long acting insulin by 10-20%. Hopefully we are able to titrate his sugars down to 140-180 and discharge him tomorrow with proper education and follow up. We have started his torsemide at half dose of his home dose, and will titrate up gradually. If he complains of more SOB, we will proceed with CXR and reassess.   Dayshon Roback 05/12/2013, 11:41 AM.

## 2013-05-13 LAB — GLUCOSE, CAPILLARY
Glucose-Capillary: 229 mg/dL — ABNORMAL HIGH (ref 70–99)
Glucose-Capillary: 349 mg/dL — ABNORMAL HIGH (ref 70–99)
Glucose-Capillary: 354 mg/dL — ABNORMAL HIGH (ref 70–99)
Glucose-Capillary: 274 mg/dL — ABNORMAL HIGH (ref 70–99)

## 2013-05-13 LAB — OXYCODONE, URINE (LC/MS-MS)
NOROXYCODONE, UR: 394 ng/mL
Oxycodone, ur: 325 ng/mL
Oxymorphone: 210 ng/mL

## 2013-05-13 LAB — COCAINE METABOLITE (GC/LC/MS), URINE: Benzoylecgonine GC/MS Conf: 435 ng/mL — ABNORMAL HIGH

## 2013-05-13 MED ORDER — LISINOPRIL 2.5 MG PO TABS
2.5000 mg | ORAL_TABLET | Freq: Every day | ORAL | Status: DC
  Administered 2013-05-13 – 2013-05-14 (×2): 2.5 mg via ORAL
  Filled 2013-05-13 (×2): qty 1

## 2013-05-13 MED ORDER — INSULIN DETEMIR 100 UNIT/ML ~~LOC~~ SOLN
48.0000 [IU] | Freq: Every day | SUBCUTANEOUS | Status: DC
  Administered 2013-05-13: 48 [IU] via SUBCUTANEOUS
  Filled 2013-05-13 (×2): qty 0.48

## 2013-05-13 NOTE — Progress Notes (Signed)
Subjective: CBGs still elevated above 250. Pt states that he is feeling weak this morning. Denies SOB, N/V, or abdominal pain.   Objective: Vital signs in last 24 hours: Filed Vitals:   05/12/13 2148 05/13/13 0142 05/13/13 0500 05/13/13 0554  BP: 138/72 121/71  134/83  Pulse: 65 65  63  Temp: 98.4 F (36.9 C) 98 F (36.7 C)  98 F (36.7 C)  TempSrc: Oral Oral  Oral  Resp: $Remo'17 17  18  'tXPiI$ Height:      Weight:   264 lb 12.4 oz (120.1 kg)   SpO2: 99% 97%  98%   Weight change: -4 lb 10.1 oz (-2.1 kg)  Intake/Output Summary (Last 24 hours) at 05/13/13 1016 Last data filed at 05/13/13 1000  Gross per 24 hour  Intake    480 ml  Output   2400 ml  Net  -1920 ml   Physical Exam-  Vitals reviewed. General: Resting in chair, NAD HEENT: PERRL, EOMI Cardiac: RRR, no rubs, murmurs or gallops Pulm: Clear to auscultation bilaterally, no wheezes, rales, or rhonchi Abd: Soft, nontender, nondistended, BS present Ext: Warm and well perfused, no peripheral edema appreciable Neuro: A&Ox3, CN II-XII grossly intact, strength intact and equal in bilateral upper and lower extremities   Lab Results: Basic Metabolic Panel:  Recent Labs Lab 05/10/13 0135  05/11/13 0800 05/12/13 0320  NA 133*  < > 135* 133*  K 4.1  < > 4.6 4.9  CL 93*  < > 97 100  CO2 23  < > 23 22  GLUCOSE 310*  < > 221* 257*  BUN 44*  < > 36* 26*  CREATININE 2.18*  < > 1.32 1.14  CALCIUM 10.4  < > 9.8 9.9  MG 2.2  --   --   --   < > = values in this interval not displayed. Liver Function Tests:  Recent Labs Lab 05/09/13 1603  AST 35  ALT 47  ALKPHOS 150*  BILITOT 0.4  PROT 8.8*  ALBUMIN 4.3   CBC:  Recent Labs Lab 05/09/13 1603 05/11/13 0240  WBC 12.1* 11.7*  HGB 16.1 13.7  HCT 46.2 41.6  MCV 82.9 84.4  PLT 210 182   Cardiac Enzymes:  Recent Labs Lab 05/09/13 2001 05/10/13 0135 05/10/13 0715  TROPONINI <0.30 <0.30 <0.30   CBG:  Recent Labs Lab 05/12/13 0343 05/12/13 0834 05/12/13 1124  05/12/13 1535 05/12/13 2145 05/13/13 0754  GLUCAP 262* 216* 286* 254* 280* 229*   Urine Drug Screen: Drugs of Abuse     Component Value Date/Time   LABOPIA NONE DETECTED 05/09/2013 2129   LABOPIA NEG 05/09/2013 1704   COCAINSCRNUR POSITIVE* 05/09/2013 2129   COCAINSCRNUR PPS 05/09/2013 1704   LABBENZ NONE DETECTED 05/09/2013 2129   LABBENZ NEG 05/09/2013 1704   AMPHETMU NONE DETECTED 05/09/2013 2129   THCU NONE DETECTED 05/09/2013 2129   LABBARB NONE DETECTED 05/09/2013 2129   LABBARB NEG 05/09/2013 1704    Alcohol Level:  Recent Labs Lab 05/09/13 1642  ETH <10   Urinalysis:  Recent Labs Lab 05/09/13 1704  COLORURINE YELLOW  LABSPEC 1.030  PHURINE 5.0  GLUCOSEU 1000*  HGBUR NEG  BILIRUBINUR NEG  KETONESUR NEG  PROTEINUR NEG  UROBILINOGEN 0.2  NITRITE NEG  LEUKOCYTESUR NEG    Micro Results: Recent Results (from the past 240 hour(s))  CULTURE, BLOOD (SINGLE)     Status: None   Collection Time    05/09/13  4:42 PM      Result  Value Ref Range Status   Preliminary Report Blood Culture received; No Growth to date;   Preliminary   Preliminary Report Culture will be held for 5 days before issuing   Preliminary   Preliminary Report a Final Negative report.   Preliminary  CULTURE, BLOOD (SINGLE)     Status: None   Collection Time    05/09/13  4:50 PM      Result Value Ref Range Status   Preliminary Report Blood Culture received; No Growth to date;   Preliminary   Preliminary Report Culture will be held for 5 days before issuing   Preliminary   Preliminary Report a Final Negative report.   Preliminary  URINE CULTURE     Status: None   Collection Time    05/09/13  5:04 PM      Result Value Ref Range Status   Colony Count NO GROWTH   Final   Organism ID, Bacteria NO GROWTH   Final  MRSA PCR SCREENING     Status: None   Collection Time    05/11/13  7:35 AM      Result Value Ref Range Status   MRSA by PCR NEGATIVE  NEGATIVE Final   Comment:            The GeneXpert  MRSA Assay (FDA     approved for NASAL specimens     only), is one component of a     comprehensive MRSA colonization     surveillance program. It is not     intended to diagnose MRSA     infection nor to guide or     monitor treatment for     MRSA infections.   Studies/Results: No results found.  Medications: I have reviewed the patient's current medications. Scheduled Meds: . ARIPiprazole  10 mg Oral QHS  . carvedilol  18.75 mg Oral BID WC  . insulin aspart  0-15 Units Subcutaneous TID WC  . insulin detemir  48 Units Subcutaneous Daily  . Insulin Pen Starter Kit  1 kit Other Once  . oxyCODONE-acetaminophen  1.5 tablet Oral TID  . pantoprazole  40 mg Oral BID  . polyethylene glycol  17 g Oral BID  . torsemide  20 mg Oral BID  . traZODone  100 mg Oral QHS   Continuous Infusions:   PRN Meds:.dextrose, morphine injection  Assessment/Plan: 58yo M with uncontrolled DM presents with DKA in the setting of recent cocaine use.   #DKA: Resolved. UDS positive for cocaine on admission, which is the likely the trigger in the setting of drinking copious amounts of Kool-Aid. Ischemic and infectious work ups- negative. Lactate is wnl. Now on subcutaneous insulin, Levemir and Novolog. CBGs still over 250 despite increasing his dose, and he has required over 20 additional units of SSI - Increase Levemir to 48 units Q24H - Novolog moderate correction scale AC - CBG AC&HS - Carb modified diet  - Blood cultures x2 > NGTD - Urine culture > No growth - Pain control with home Percocet 5-325 q8h PRN - Observing CBGs overnight after insulin titration and if CBGs<250, will discharge tomorrow - Metformin will need to be discontinued in this patient upon discharge, it would be contraindicated given his CHF and CKD  Glucose-Capillary  Date Value Ref Range Status  05/13/2013 229* 70 - 99 mg/dL Final  4/62/5310 337* 70 - 99 mg/dL Final  06/20/1609 963* 70 - 99 mg/dL Final  09/06/320 520* 70 - 99  mg/dL Final  1/33/9788 509*  70 - 99 mg/dL Final    #Chronic combined systolic and diastolic CHF: Last 2D echo 10/27/12 showed EF 06-26%, grade 2 diastolic dysfunction. He is followed by Dr. Doylene Canard and last saw Darrick Grinder on 03/29/2013. Per her note, his goal weight is less than 276 lbs, stable on admission. 264lb today after starting back the Torsemide at $RemoveBefo'20mg'PFunqNNvFqt$  BID. - Strict I/O.  - On torsemide at $RemoveBefo'20mg'QihUeaJuZhH$  BID, which is half the home dose. - Monitoring volume status closely   Filed Weights   05/11/13 0425 05/12/13 0700 05/13/13 0500  Weight: 271 lb 6.2 oz (123.1 kg) 269 lb 6.4 oz (122.2 kg) 264 lb 12.4 oz (120.1 kg)    #Hyponatremia: Stable. Improved as expected with better control of hyperglycemia. - insulin regimen as noted above   Sodium  Date Value Ref Range Status  05/12/2013 133* 137 - 147 mEq/L Final  05/11/2013 135* 137 - 147 mEq/L Final  05/11/2013 133* 137 - 147 mEq/L Final  05/10/2013 133* 137 - 147 mEq/L Final  05/10/2013 134* 137 - 147 mEq/L Final    #Acute on chronic CKD stage 2: Cr bumped to a high of 2.18 this admission, but has trended down to his baseline. Holding home ACEi, but will need to add back at low dose given his low-normal BPs. - Restart lisinopril at 2.$RemoveBeforeD'5mg'VEEEgGgMSzXQlF$  and will increase as needed.  Creatinine, Ser  Date Value Ref Range Status  05/12/2013 1.14  0.50 - 1.35 mg/dL Final  05/11/2013 1.32  0.50 - 1.35 mg/dL Final  05/11/2013 1.55* 0.50 - 1.35 mg/dL Final  05/10/2013 1.47* 0.50 - 1.35 mg/dL Final  05/10/2013 1.55* 0.50 - 1.35 mg/dL Final    #Hypotension: Resolved. BP at goal. Pt on home Coreg and Torsemide $RemoveBefor'20mg'NmrGnNELWXfn$  daily. Clonidine continues to be held and his BP has been very stable and well controlled. - Coreg 18.$RemoveBe'75mg'mwHaDLKJw$  BID - Holding home clonidine. - torsemide $RemoveBef'20mg'sBOLAMmblL$  BID  #Hypoxia: Resolved. Currently saturating well on room air. - Closely monitor oxygen saturations given CHF and IVF  ALL OTHER MEDICAL COMORBIDITIES STABLE  #DVT PPx: Patient has a heparin allergy.  SCDs for now.   Dispo: Disposition is deferred at this time, awaiting improvement of current medical problems.  Anticipated discharge in approximately 1 day(s).   The patient does have a current PCP Dominic Pea, DO) and does need an [facility] hospital follow-up appointment after discharge.  The patient does not have transportation limitations that hinder transportation to clinic appointments.  .Services Needed at time of discharge: Y = Yes, Blank = No PT:   OT:   RN:   Equipment:   Other:     LOS: 4 days   Otho Bellows, MD 05/13/2013, 10:16 AM

## 2013-05-14 LAB — BASIC METABOLIC PANEL
BUN: 27 mg/dL — AB (ref 6–23)
CHLORIDE: 96 meq/L (ref 96–112)
CO2: 21 meq/L (ref 19–32)
CREATININE: 1.28 mg/dL (ref 0.50–1.35)
Calcium: 9.8 mg/dL (ref 8.4–10.5)
GFR calc Af Amer: 69 mL/min — ABNORMAL LOW (ref >90)
GFR calc non Af Amer: 60 mL/min — ABNORMAL LOW (ref >90)
GLUCOSE: 368 mg/dL — AB (ref 70–99)
Potassium: 4.6 mEq/L (ref 3.7–5.3)
Sodium: 131 mEq/L — ABNORMAL LOW (ref 137–147)

## 2013-05-14 LAB — GLUCOSE, CAPILLARY
GLUCOSE-CAPILLARY: 294 mg/dL — AB (ref 70–99)
Glucose-Capillary: 224 mg/dL — ABNORMAL HIGH (ref 70–99)
Glucose-Capillary: 262 mg/dL — ABNORMAL HIGH (ref 70–99)
Glucose-Capillary: 378 mg/dL — ABNORMAL HIGH (ref 70–99)
Glucose-Capillary: 297 mg/dL — ABNORMAL HIGH (ref 70–99)

## 2013-05-14 MED ORDER — LISINOPRIL 5 MG PO TABS
5.0000 mg | ORAL_TABLET | Freq: Every day | ORAL | Status: DC
  Administered 2013-05-15: 5 mg via ORAL
  Filled 2013-05-14 (×2): qty 1

## 2013-05-14 MED ORDER — INSULIN DETEMIR 100 UNIT/ML ~~LOC~~ SOLN
55.0000 [IU] | Freq: Every day | SUBCUTANEOUS | Status: DC
  Administered 2013-05-14: 55 [IU] via SUBCUTANEOUS
  Filled 2013-05-14 (×2): qty 0.55

## 2013-05-14 MED ORDER — INSULIN ASPART 100 UNIT/ML ~~LOC~~ SOLN: 0.0000 [IU] | Freq: Three times a day (TID) | SUBCUTANEOUS | Status: DC

## 2013-05-14 MED ORDER — INSULIN ASPART 100 UNIT/ML ~~LOC~~ SOLN
0.0000 [IU] | Freq: Three times a day (TID) | SUBCUTANEOUS | Status: DC
  Administered 2013-05-15 (×2): 7 [IU] via SUBCUTANEOUS
  Administered 2013-05-15: 4 [IU] via SUBCUTANEOUS

## 2013-05-14 MED ORDER — INSULIN ASPART 100 UNIT/ML ~~LOC~~ SOLN
5.0000 [IU] | Freq: Three times a day (TID) | SUBCUTANEOUS | Status: DC
  Administered 2013-05-14 (×2): 5 [IU] via SUBCUTANEOUS

## 2013-05-14 MED ORDER — INSULIN ASPART 100 UNIT/ML ~~LOC~~ SOLN
12.0000 [IU] | Freq: Once | SUBCUTANEOUS | Status: AC
  Administered 2013-05-14: 12 [IU] via SUBCUTANEOUS

## 2013-05-14 MED ORDER — INSULIN ASPART 100 UNIT/ML ~~LOC~~ SOLN
8.0000 [IU] | Freq: Once | SUBCUTANEOUS | Status: AC
  Administered 2013-05-14: 8 [IU] via SUBCUTANEOUS

## 2013-05-14 MED ORDER — INSULIN ASPART 100 UNIT/ML ~~LOC~~ SOLN
0.0000 [IU] | Freq: Three times a day (TID) | SUBCUTANEOUS | Status: DC
Start: 2013-05-14 — End: 2013-05-14

## 2013-05-14 MED ORDER — INSULIN ASPART 100 UNIT/ML ~~LOC~~ SOLN
0.0000 [IU] | Freq: Three times a day (TID) | SUBCUTANEOUS | Status: DC
  Administered 2013-05-14: 15 [IU] via SUBCUTANEOUS

## 2013-05-14 NOTE — Progress Notes (Signed)
Subjective: CBGs still elevated above 250. Denies SOB, N/V, or abdominal pain. Feeling ok overall.  Objective: Vital signs in last 24 hours: Filed Vitals:   05/13/13 1356 05/13/13 1817 05/13/13 2127 05/14/13 0507  BP: 154/75 109/47 114/70 133/73  Pulse: 68 70 58 65  Temp: 97.4 F (36.3 C) 98.7 F (37.1 C) 98.7 F (37.1 C) 98.5 F (36.9 C)  TempSrc: Oral Oral Oral Oral  Resp: $Remo'16 17 16 18  'aroMX$ Height:      Weight:    267 lb 14.4 oz (121.519 kg)  SpO2: 96% 95% 96% 96%   Weight change: 3 lb 2 oz (1.419 kg)  Intake/Output Summary (Last 24 hours) at 05/14/13 1100 Last data filed at 05/14/13 0900  Gross per 24 hour  Intake   1760 ml  Output      0 ml  Net   1760 ml   Physical Exam-  Vitals reviewed. General: Sitting up in chair, NAD HEENT: PERRL, EOMI Cardiac: RRR, no rubs, murmurs or gallops Pulm: Clear to auscultation bilaterally, no wheezes, rales, or rhonchi Abd: Soft, nontender, nondistended, BS present Ext: Warm and well perfused, no peripheral edema appreciable Neuro: A&Ox3, CN II-XII grossly intact, strength intact and equal in bilateral upper and lower extremities   Lab Results: Basic Metabolic Panel:  Recent Labs Lab 05/10/13 0135  05/11/13 0800 05/12/13 0320  NA 133*  < > 135* 133*  K 4.1  < > 4.6 4.9  CL 93*  < > 97 100  CO2 23  < > 23 22  GLUCOSE 310*  < > 221* 257*  BUN 44*  < > 36* 26*  CREATININE 2.18*  < > 1.32 1.14  CALCIUM 10.4  < > 9.8 9.9  MG 2.2  --   --   --   < > = values in this interval not displayed. Liver Function Tests:  Recent Labs Lab 05/09/13 1603  AST 35  ALT 47  ALKPHOS 150*  BILITOT 0.4  PROT 8.8*  ALBUMIN 4.3   CBC:  Recent Labs Lab 05/09/13 1603 05/11/13 0240  WBC 12.1* 11.7*  HGB 16.1 13.7  HCT 46.2 41.6  MCV 82.9 84.4  PLT 210 182   Cardiac Enzymes:  Recent Labs Lab 05/09/13 2001 05/10/13 0135 05/10/13 0715  TROPONINI <0.30 <0.30 <0.30   CBG:  Recent Labs Lab 05/12/13 2145 05/13/13 0754  05/13/13 1158 05/13/13 1659 05/13/13 2123 05/14/13 0748  GLUCAP 280* 229* 349* 354* 274* 224*   Urine Drug Screen: Drugs of Abuse     Component Value Date/Time   LABOPIA NONE DETECTED 05/09/2013 2129   LABOPIA NEG 05/09/2013 1704   COCAINSCRNUR POSITIVE* 05/09/2013 2129   COCAINSCRNUR PPS 05/09/2013 1704   LABBENZ NONE DETECTED 05/09/2013 2129   LABBENZ NEG 05/09/2013 1704   AMPHETMU NONE DETECTED 05/09/2013 2129   THCU NONE DETECTED 05/09/2013 2129   LABBARB NONE DETECTED 05/09/2013 2129   LABBARB NEG 05/09/2013 1704    Alcohol Level:  Recent Labs Lab 05/09/13 1642  ETH <10   Urinalysis:  Recent Labs Lab 05/09/13 1704  COLORURINE YELLOW  LABSPEC 1.030  PHURINE 5.0  GLUCOSEU 1000*  HGBUR NEG  BILIRUBINUR NEG  KETONESUR NEG  PROTEINUR NEG  UROBILINOGEN 0.2  NITRITE NEG  LEUKOCYTESUR NEG    Micro Results: Recent Results (from the past 240 hour(s))  CULTURE, BLOOD (SINGLE)     Status: None   Collection Time    05/09/13  4:42 PM      Result  Value Ref Range Status   Preliminary Report Blood Culture received; No Growth to date;   Preliminary   Preliminary Report Culture will be held for 5 days before issuing   Preliminary   Preliminary Report a Final Negative report.   Preliminary  CULTURE, BLOOD (SINGLE)     Status: None   Collection Time    05/09/13  4:50 PM      Result Value Ref Range Status   Preliminary Report Blood Culture received; No Growth to date;   Preliminary   Preliminary Report Culture will be held for 5 days before issuing   Preliminary   Preliminary Report a Final Negative report.   Preliminary  URINE CULTURE     Status: None   Collection Time    05/09/13  5:04 PM      Result Value Ref Range Status   Colony Count NO GROWTH   Final   Organism ID, Bacteria NO GROWTH   Final  MRSA PCR SCREENING     Status: None   Collection Time    05/11/13  7:35 AM      Result Value Ref Range Status   MRSA by PCR NEGATIVE  NEGATIVE Final   Comment:             The GeneXpert MRSA Assay (FDA     approved for NASAL specimens     only), is one component of a     comprehensive MRSA colonization     surveillance program. It is not     intended to diagnose MRSA     infection nor to guide or     monitor treatment for     MRSA infections.   Studies/Results: No results found.  Medications: I have reviewed the patient's current medications. Scheduled Meds: . ARIPiprazole  10 mg Oral QHS  . carvedilol  18.75 mg Oral BID WC  . insulin aspart  0-15 Units Subcutaneous TID WC  . insulin aspart  5 Units Subcutaneous TID WC  . insulin detemir  55 Units Subcutaneous Daily  . Insulin Pen Starter Kit  1 kit Other Once  . lisinopril  2.5 mg Oral Daily  . oxyCODONE-acetaminophen  1.5 tablet Oral TID  . pantoprazole  40 mg Oral BID  . polyethylene glycol  17 g Oral BID  . torsemide  20 mg Oral BID  . traZODone  100 mg Oral QHS   Continuous Infusions:   PRN Meds:.dextrose, morphine injection  Assessment/Plan: 58yo M with uncontrolled DM presents with DKA in the setting of recent cocaine use.   #DKA: Resolved. UDS positive for cocaine on admission, which is the likely the trigger in the setting of drinking copious amounts of Kool-Aid. Ischemic and infectious work ups- negative. Lactate is wnl. Now on subcutaneous insulin, Levemir and Novolog. CBGs still over 250 despite increasing his dose, and he continues to require over 20 additional units of SSI - Increase Levemir to 55 units Q24H - Starting mealtime coverage 5u AC - Novolog moderate correction scale AC - CBG AC&HS - Carb modified diet  - Pain control with home Percocet 5-325 q8h PRN - Observing CBGs overnight after insulin titration and if CBGs<250, will discharge tomorrow - Metformin will need to be discontinued in this patient upon discharge, it would be contraindicated given his CHF and CKD  Glucose-Capillary  Date Value Ref Range Status  05/14/2013 224* 70 - 99 mg/dL Final  05/13/2013 274* 70  - 99 mg/dL Final  05/13/2013 354* 70 - 99  mg/dL Final  05/13/2013 349* 70 - 99 mg/dL Final  05/13/2013 229* 70 - 99 mg/dL Final    #Chronic combined systolic and diastolic CHF: Last 2D echo 10/27/12 showed EF 62-13%, grade 2 diastolic dysfunction. He is followed by Dr. Doylene Canard and last saw Darrick Grinder on 03/29/2013. Per her note, his goal weight is less than 276 lbs, stable on admission. 267lb today after starting back the Torsemide at $RemoveBefo'20mg'PJQUFsLZVMJ$  BID. No signs of volume overload on exam.  - Strict I/O.  - Continue torsemide at $RemoveBefo'20mg'JLZeorlGwqT$  BID, which is half the home dose. - Daily weights - Monitoring volume status closely   Filed Weights   05/12/13 0700 05/13/13 0500 05/14/13 0507  Weight: 269 lb 6.4 oz (122.2 kg) 264 lb 12.4 oz (120.1 kg) 267 lb 14.4 oz (121.519 kg)    #Hyponatremia: Stable. Improved as expected with better control of hyperglycemia. - insulin regimen as noted above   Sodium  Date Value Ref Range Status  05/12/2013 133* 137 - 147 mEq/L Final  05/11/2013 135* 137 - 147 mEq/L Final  05/11/2013 133* 137 - 147 mEq/L Final  05/10/2013 133* 137 - 147 mEq/L Final  05/10/2013 134* 137 - 147 mEq/L Final    #Acute on chronic CKD stage 2: Cr as high as 2.18 this admission, but has trended down to his baseline. Initially holding home ACEi, but added back at low dose given his low-normal BPs for renal protection. - Increasing Lisinopril to $RemoveBefor'5mg'caKHhkOahYGC$  daily and will increase as needed.  Creatinine, Ser  Date Value Ref Range Status  05/12/2013 1.14  0.50 - 1.35 mg/dL Final  05/11/2013 1.32  0.50 - 1.35 mg/dL Final  05/11/2013 1.55* 0.50 - 1.35 mg/dL Final  05/10/2013 1.47* 0.50 - 1.35 mg/dL Final  05/10/2013 1.55* 0.50 - 1.35 mg/dL Final    #Hypotension: Resolved. BP at goal. Pt on home Coreg and Torsemide $RemoveBefor'20mg'YqKzrsnNuQVX$  daily. Clonidine continues to be held and his BP has been very stable and well controlled. - Coreg 18.$RemoveBe'75mg'xMjsMVqpG$  BID - Holding home clonidine. - torsemide $RemoveBef'20mg'ZGGyPsPVEa$  BID - Lisinopril $RemoveBefo'5mg'aTLAOChSyWW$  qday  #Hypoxia:  Resolved. Currently saturating well on room air. - Closely monitor oxygen saturations given CHF and IVF  ALL OTHER MEDICAL COMORBIDITIES STABLE  #DVT PPx: Patient has a heparin allergy. SCDs for now.   Dispo: Disposition is deferred at this time, awaiting improvement of current medical problems.  Anticipated discharge in approximately 1 day(s).   The patient does have a current PCP Dominic Pea, DO) and does need an [facility] hospital follow-up appointment after discharge.  The patient does not have transportation limitations that hinder transportation to clinic appointments.  .Services Needed at time of discharge: Y = Yes, Blank = No PT:   OT:   RN:   Equipment:   Other:     LOS: 5 days   Otho Bellows, MD 05/14/2013, 11:00 AM

## 2013-05-15 ENCOUNTER — Other Ambulatory Visit: Payer: Self-pay | Admitting: Internal Medicine

## 2013-05-15 ENCOUNTER — Ambulatory Visit: Payer: PRIVATE HEALTH INSURANCE | Admitting: *Deleted

## 2013-05-15 DIAGNOSIS — IMO0001 Reserved for inherently not codable concepts without codable children: Secondary | ICD-10-CM

## 2013-05-15 DIAGNOSIS — I959 Hypotension, unspecified: Secondary | ICD-10-CM

## 2013-05-15 DIAGNOSIS — E1165 Type 2 diabetes mellitus with hyperglycemia: Secondary | ICD-10-CM

## 2013-05-15 LAB — CULTURE, BLOOD (SINGLE)
Organism ID, Bacteria: NO GROWTH
Organism ID, Bacteria: NO GROWTH

## 2013-05-15 LAB — GLUCOSE, CAPILLARY
Glucose-Capillary: 152 mg/dL — ABNORMAL HIGH (ref 70–99)
Glucose-Capillary: 212 mg/dL — ABNORMAL HIGH (ref 70–99)
Glucose-Capillary: 241 mg/dL — ABNORMAL HIGH (ref 70–99)

## 2013-05-15 MED ORDER — INSULIN DETEMIR 100 UNIT/ML ~~LOC~~ SOLN
65.0000 [IU] | Freq: Every day | SUBCUTANEOUS | Status: DC
  Filled 2013-05-15: qty 0.65

## 2013-05-15 MED ORDER — LIVING WELL WITH DIABETES BOOK: 1.0000 | Freq: Once | Status: DC

## 2013-05-15 MED ORDER — INSULIN ASPART 100 UNIT/ML ~~LOC~~ SOLN: 10.0000 [IU] | Freq: Three times a day (TID) | SUBCUTANEOUS | Status: DC

## 2013-05-15 MED ORDER — LIVING WELL WITH DIABETES BOOK
Freq: Once | Status: AC
  Administered 2013-05-15: 15:00:00
  Filled 2013-05-15: qty 1

## 2013-05-15 MED ORDER — INSULIN DETEMIR 100 UNIT/ML FLEXPEN: 55.0000 [IU] | PEN_INJECTOR | Freq: Every day | SUBCUTANEOUS | Status: DC

## 2013-05-15 MED ORDER — INSULIN DETEMIR 100 UNIT/ML ~~LOC~~ SOLN
55.0000 [IU] | Freq: Every day | SUBCUTANEOUS | Status: DC
  Administered 2013-05-15: 55 [IU] via SUBCUTANEOUS
  Filled 2013-05-15: qty 0.55

## 2013-05-15 MED ORDER — INSULIN ASPART 100 UNIT/ML ~~LOC~~ SOLN
10.0000 [IU] | Freq: Three times a day (TID) | SUBCUTANEOUS | Status: DC
  Administered 2013-05-15 (×3): 10 [IU] via SUBCUTANEOUS

## 2013-05-15 NOTE — Progress Notes (Signed)
Called by MD to talk with pt and assess his knowledge of how to use the insulin pen. Pt states he is to go home on Levemir and set dose of meal coverage tidwc.  However he states he does not have to check his glucose levels before each meal.  I am not able to see the final discharge orders. Pt does not want to use a correction scale in addition to the standing insulin orders. Pt demonstrated how to use the Levemir pen, however he states he is not familiar with signs and symptoms of hgh and low blood sugar nor treatment of lows. Requested RN make sure pt has seen the videos on the pen, nutrition, high and low blood sugar, all within # [phone].  It does not appear that pt has had much teaching while here at all.  Will order also the "Living Well with Diabetes" book, exit care notes and glucose monitoring instructions per RN/CNA.  Also, This pt also has thrombocytopenia which would influence his HgbA1C results tremendously. I am also concerned with the hx of Schizophrenia and depression.  But if pt has homehealth or therapist to come and check on him, it will be safer as he lives alone. Thank you, Rosita Kea, RN, CNS, Diabetes Coordinator [phone])

## 2013-05-15 NOTE — Progress Notes (Signed)
    Day 6 of stay      Patient name: [name] [name]  Medical record number: [id number]  Date of birth: [birth year]   I examined this patient in our morning rounds with the IM team. The patient has been keeping elevated blood sugars and we have been trying to optimize his insulin before discharge because he is insulin naive. He is currently using 55 units of Levemir and has been increased to 10 units of Novolog at mealtime three times a day plus he is on a high blood sugar correction dose as well. His weight is 266 lbs which is about 120 kgs, thus his total daily initial insulin requirement is calculated to be 60 units. He is however, already maxed out on his Lantus dose, and I have asked my team to start increasing his Novolog dosage to balance and avoid hypoglycemia in fasting states. Due to apparent high insulin resistance, and clinically stable vitals, I think we will relax his in-hospital blood sugar targets and it might be okay to discharge him if he is keeping his blood sugars in 200-250 which can then be titrated as outpatient.  I have discussed the care of this patient with my IM team. Please see their note for details of management of other chronic and acute issues of the patient.    Lauretta Sallas 05/15/2013, 11:56 AM.

## 2013-05-15 NOTE — Progress Notes (Signed)
Experiencing technical difficulties with watching Diabetic videos. Patient is unable to view at this time. Will attempt again later.

## 2013-05-15 NOTE — Discharge Summary (Signed)
Discharge teaching reviewed with patient. Follow up appointments reviewed. Daily medications reviewed with patient. Diabetes education given to patient. Reviewed with patient how to administer insulin and patient administered insulin. Patient instructed on when to call provider and what signs and symptoms indicate hypoglycemia and hyperglycemia. Reviewed sick day foods with patient. Patient understand discharge teaching and is prepared for discharge.

## 2013-05-15 NOTE — Progress Notes (Addendum)
Subjective: Pt required 20u extra  of novolog overnight, blood sugars high 200s. Blood sugars this am- 152. Denies SOB, N/V, or abdominal pain or pedal edema. Feeling ok overall. Asked about what type of insulin he would be getting- Vials as against P[place].  Objective: Vital signs in last 24 hours: Filed Vitals:   05/14/13 1355 05/14/13 2219 05/15/13 0546 05/15/13 0611  BP: 140/78 101/65  113/84  Pulse: 65 59  72  Temp: 97.8 F (36.6 C) 98.2 F (36.8 C)  98.5 F (36.9 C)  TempSrc: Oral Oral  Oral  Resp: _0 Height:      Weight:   266 lb 3.2 oz (120.748 kg)   SpO2: 100% 96%  100%   Weight change: -1 lb 11.2 oz (-0.771 kg)  Intake/Output Summary (Last 24 hours) at 05/15/13 1032 Last data filed at 05/15/13 0950  Gross per 24 hour  Intake   1122 ml  Output      0 ml  Net   1122 ml   Physical Exam-  Vitals reviewed. General: Sitting up in chair, Comfortable, not in any distress HEENT: PERRL, EOMI Cardiac: RRR, no rubs, murmurs or gallops Pulm: Clear to auscultation bilaterally, no wheezes or crackles. Abd: Soft, nontender, nondistended, BS present Ext: Warm and well perfused, no peripheral edema appreciable Neuro: No obvious CN abnormality, intact and equal in all extremities  Lab Results: Basic Metabolic Panel:  Recent Labs Lab 05/10/13 0135  05/12/13 0320 05/14/13 1717  NA 133*  < > 133* 131*  K 4.1  < > 4.9 4.6  CL 93*  < > 100 96  CO2 23  < > 22 21  GLUCOSE 310*  < > 257* 368*  BUN 44*  < > 26* 27*  CREATININE 2.18*  < > 1.14 1.28  CALCIUM 10.4  < > 9.9 9.8  MG 2.2  --   --   --   < > = values in this interval not displayed. Liver Function Tests:  Recent Labs Lab 05/09/13 1603  AST 35  ALT 47  ALKPHOS 150*  BILITOT 0.4  PROT 8.8*  ALBUMIN 4.3   CBC:  Recent Labs Lab 05/09/13 1603 05/11/13 0240  WBC 12.1* 11.7*  HGB 16.1 13.7  HCT 46.2 41.6  MCV 82.9 84.4  PLT 210 182   Cardiac Enzymes:  Recent Labs Lab 05/09/13 2001  05/10/13 0135 05/10/13 0715  TROPONINI <0.30 <0.30 <0.30   CBG:  Recent Labs Lab 05/14/13 0748 05/14/13 1200 05/14/13 1650 05/14/13 2134 05/14/13 2243 05/15/13 0801  GLUCAP 224* 262* 378* 297* 294* 152*   Urine Drug Screen: Drugs of Abuse     Component Value Date/Time   LABOPIA NONE DETECTED 05/09/2013 2129   LABOPIA NEG 05/09/2013 1704   COCAINSCRNUR POSITIVE* 05/09/2013 2129   COCAINSCRNUR PPS 05/09/2013 1704   LABBENZ NONE DETECTED 05/09/2013 2129   LABBENZ NEG 05/09/2013 1704   AMPHETMU NONE DETECTED 05/09/2013 2129   THCU NONE DETECTED 05/09/2013 2129   LABBARB NONE DETECTED 05/09/2013 2129   LABBARB NEG 05/09/2013 1704    Alcohol Level:  Recent Labs Lab 05/09/13 1642  ETH <10   Urinalysis:  Recent Labs Lab 05/09/13 1704  COLORURINE YELLOW  LABSPEC 1.030  PHURINE 5.0  GLUCOSEU 1000*  HGBUR NEG  BILIRUBINUR NEG  KETONESUR NEG  PROTEINUR NEG  UROBILINOGEN 0.2  NITRITE NEG  LEUKOCYTESUR NEG    Micro Results: Recent Results (from the past 240 hour(s))  CULTURE, BLOOD (SINGLE)  Status: None   Collection Time    05/09/13  4:42 PM      Result Value Ref Range Status   Organism ID, Bacteria NO GROWTH 5 DAYS   Final  CULTURE, BLOOD (SINGLE)     Status: None   Collection Time    05/09/13  4:50 PM      Result Value Ref Range Status   Organism ID, Bacteria NO GROWTH 5 DAYS   Final  URINE CULTURE     Status: None   Collection Time    05/09/13  5:04 PM      Result Value Ref Range Status   Colony Count NO GROWTH   Final   Organism ID, Bacteria NO GROWTH   Final  MRSA PCR SCREENING     Status: None   Collection Time    05/11/13  7:35 AM      Result Value Ref Range Status   MRSA by PCR NEGATIVE  NEGATIVE Final   Comment:            The GeneXpert MRSA Assay (FDA     approved for NASAL specimens     only), is one component of a     comprehensive MRSA colonization     surveillance program. It is not     intended to diagnose MRSA     infection nor to  guide or     monitor treatment for     MRSA infections.   Medications: I have reviewed the patient's current medications. Scheduled Meds: . ARIPiprazole  10 mg Oral QHS  . carvedilol  18.75 mg Oral BID WC  . insulin aspart  0-20 Units Subcutaneous TID WC  . insulin aspart  10 Units Subcutaneous TID WC  . insulin detemir  55 Units Subcutaneous Daily  . Insulin Pen Starter Kit  1 kit Other Once  . lisinopril  5 mg Oral Daily  . oxyCODONE-acetaminophen  1.5 tablet Oral TID  . pantoprazole  40 mg Oral BID  . polyethylene glycol  17 g Oral BID  . torsemide  20 mg Oral BID  . traZODone  100 mg Oral QHS   Continuous Infusions:   PRN Meds:.dextrose, morphine injection  Assessment/Plan: 58yo M with uncontrolled DM presents with DKA in the setting of recent cocaine use.   #DKA: Resolved. But blood sugars difficult to control. - Continue Levemir at 55 units Q24H - Increase mealtime coverage from 5u to 10 AC - CBG AC&HS - Carb modified diet  - Pain control with home Percocet 5-325 q8h PRN - Discharge later today, if blood sugars today <250s - Diabetes Co-ordinator to talk to pt about insulin regimen before discharge. - Metformin discontinued given his CHF and CKD  Glucose-Capillary  Date Value Ref Range Status  05/15/2013 152* 70 - 99 mg/dL Final  05/14/2013 294* 70 - 99 mg/dL Final  05/14/2013 297* 70 - 99 mg/dL Final  05/14/2013 378* 70 - 99 mg/dL Final  05/14/2013 262* 70 - 99 mg/dL Final    #Chronic combined systolic and diastolic CHF: Last 2D echo 10/27/12 showed EF 40-10%, grade 2 diastolic dysfunction. He is followed by Dr. Doylene Canard and last saw Darrick Grinder on 03/29/2013. Per her note, his goal weight is less than 276 lbs, stable on admission.  No signs of volume overload on exam.  - Strict I/O.  - Continue torsemide at 56m BID, which is half the home dose. - Daily weights - Monitoring volume status closely   FAutoliv  05/13/13 0500 05/14/13 0507 05/15/13 0546  Weight: 264  lb 12.4 oz (120.1 kg) 267 lb 14.4 oz (121.519 kg) 266 lb 3.2 oz (120.748 kg)    #Hyponatremia: Stable. Improved as expected with better control of hyperglycemia. 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 Initially holding home ACEi, but added back at low dose given his low-normal BPs for renal protection. - Increasing Lisinopril to 57m daily and will increase as needed as an outpt.  Creatinine, Ser  Date Value Ref Range Status  05/14/2013 1.28  0.50 - 1.35 mg/dL Final  05/12/2013 1.14  0.50 - 1.35 mg/dL Final  05/11/2013 1.32  0.50 - 1.35 mg/dL Final  05/11/2013 1.55* 0.50 - 1.35 mg/dL Final  05/10/2013 1.47* 0.50 - 1.35 mg/dL Final    #Hypotension: Resolved. Was due to dehydration. BP at goal.  Pt on home Coreg and Torsemide 229mdaily. Clonidine continues to be held and his BP has been very stable and well controlled. - Coreg 18.7521mID - Holding home clonidine. - torsemide 54m9mD - Lisinopril 5mg 41my  #Hypoxia: Resolved. Currently saturating well on room air. - Closely monitor oxygen saturations given CHF and IVF  ALL OTHER MEDICAL COMORBIDITIES STABLE  #DVT PPx: Patient has a heparin allergy. SCDs for now.  Dispo: Disposition is deferred at this time, awaiting improvement of current medical problems.  Anticipated discharge in approximately to day(s).   The patient does have a current PCP (AlejDominic Pea and [facility].  The patient does not have transportation limitations that hinder transportation to clinic appointments.  .Services Needed at time of discharge: Y = Yes, Blank = No PT:    OT:   RN:   Equipment:   Other:     LOS: 6 days   EjiroJenetta Downer4/20/2015, 10:32 AM

## 2013-05-16 ENCOUNTER — Other Ambulatory Visit: Payer: Self-pay | Admitting: *Deleted

## 2013-05-16 MED ORDER — INSULIN PEN NEEDLE 31G X 5 MM MISC: Status: DC

## 2013-05-17 ENCOUNTER — Ambulatory Visit (INDEPENDENT_AMBULATORY_CARE_PROVIDER_SITE_OTHER): Payer: PRIVATE HEALTH INSURANCE | Admitting: Internal Medicine

## 2013-05-17 ENCOUNTER — Encounter: Payer: Self-pay | Admitting: Internal Medicine

## 2013-05-17 VITALS — BP 136/71 | HR 82 | Temp 97.1°F | Ht 71.0 in | Wt 278.6 lb

## 2013-05-17 DIAGNOSIS — I10 Essential (primary) hypertension: Secondary | ICD-10-CM

## 2013-05-17 DIAGNOSIS — I5042 Chronic combined systolic (congestive) and diastolic (congestive) heart failure: Secondary | ICD-10-CM

## 2013-05-17 DIAGNOSIS — N182 Chronic kidney disease, stage 2 (mild): Secondary | ICD-10-CM

## 2013-05-17 DIAGNOSIS — E119 Type 2 diabetes mellitus without complications: Secondary | ICD-10-CM

## 2013-05-17 DIAGNOSIS — I129 Hypertensive chronic kidney disease with stage 1 through stage 4 chronic kidney disease, or unspecified chronic kidney disease: Secondary | ICD-10-CM

## 2013-05-17 DIAGNOSIS — I509 Heart failure, unspecified: Secondary | ICD-10-CM

## 2013-05-17 LAB — BASIC METABOLIC PANEL WITH GFR
BUN: 25 mg/dL — AB (ref 6–23)
CHLORIDE: 107 meq/L (ref 96–112)
CO2: 25 meq/L (ref 19–32)
Calcium: 10 mg/dL (ref 8.4–10.5)
Creat: 1.13 mg/dL (ref 0.50–1.35)
GFR, EST NON AFRICAN AMERICAN: 71 mL/min
GFR, Est African American: 82 mL/min
Glucose, Bld: 221 mg/dL — ABNORMAL HIGH (ref 70–99)
Potassium: 4.7 mEq/L (ref 3.5–5.3)
Sodium: 139 mEq/L (ref 135–145)

## 2013-05-17 LAB — GLUCOSE, CAPILLARY: GLUCOSE-CAPILLARY: 237 mg/dL — AB (ref 70–99)

## 2013-05-17 MED ORDER — INSULIN ASPART 100 UNIT/ML ~~LOC~~ SOLN: 12.0000 [IU] | Freq: Three times a day (TID) | SUBCUTANEOUS | Status: DC

## 2013-05-17 MED ORDER — LANCETS MICRO THIN 33G MISC: Status: DC

## 2013-05-17 NOTE — Progress Notes (Signed)
INTERNAL MEDICINE TEACHING ATTENDING ADDENDUM - Luvina Poirier, MD: I reviewed and discussed at the time of visit with the resident Dr. Cater, the patient's medical history, physical examination, diagnosis and results of tests and treatment and I agree with the patient's care as documented. 

## 2013-05-17 NOTE — Progress Notes (Signed)
Subjective:    Patient ID: [name] [name], male    DOB: [birth year], 58 y.o.   MRN: [id number]  HPI  [name] [name] is a 58 y.o. Male with PMH significant for DM2 with A1C 7.4 (03/28/13), chronic combined systolic and diastolic CHF (dx 0000000, 2D echo 10/27/12 EF [id number], grade 2 diastolic dysfunction, followed by Dr. Doylene Canard), HTN, schizophrenia, hepatitis C Ab positive, cerebellar strokes (dx 01/2012), cocaine abuse who presented for hospital follow up.  The patient was admitted from 05/09/2013 to 05/15/2013 for DKA. He presented with hyperglycemia to 837 with polyuria, polydipsia, blurry vision, nausea, overall fatigue and lethargy, in the setting of recent cocaine use. Anion gap was 18. Bicarb was 21. Prior to admission patient's diabetes was managed on metformin monotherapy. Infectious and ischemic workups were negative. Blood sugars were hard to control during admission, so insulin dose was gradually titrated up until he was discharged on a regimen of Levemir 55 units QHS and Novolog 10 units with meals. Metformin was discontinued considering his CHF and CKD.   DM 2 - Today patient reports he feels well. He is resting at home with no complaints. He denies fevers or chills. He still has some blurry vision, but is improving. He got a call from his insurance company that they will appoint a nurse to help him with his diabetes. He also has a paramedic who checks in on him weekly for his heart failure. He is happy to have all the help he can get. He needs a refill of his lancets.  He brings his glucometer. Fasting CBGs are 90 to 120s. In the morning, average reading is 190. Midday, average reading is 222. In the evening, average reading is 214. He has several high readings throughout the day, mostly to 200-350 but occasionally as high as 550. Overall 51% of readings are on target. No lows. 49% about target.  Cocaine use - He is not using cocaine and has no cravings for it. He is concerned about  how his cocaine use will affect his pain contract with the pain management clinic. I said I was not sure, but it is a good step to abstain going forward.  HTN - BP today is 136/71.   Current Outpatient Prescriptions on File Prior to Visit  Medication Sig Dispense Refill  . ARIPiprazole (ABILIFY) 10 MG tablet Take 10 mg by mouth at bedtime.      . carvedilol (COREG) 12.5 MG tablet Take 18.75 mg by mouth 2 (two) times daily with a meal.      . insulin aspart (NOVOLOG) 100 UNIT/ML injection Inject 10 Units into the skin 3 (three) times daily with meals.  10 mL  11  . Insulin Detemir (LEVEMIR FLEXTOUCH) 100 UNIT/ML Pen Inject 55 Units into the skin daily at 10 pm.  15 mL  11  . Insulin Pen Needle (FIFTY50 PEN NEEDLES) 31G X 5 MM MISC Use to inject insulin up to 4 times daily. Insulin dependent. diag code 250.00  150 each  5  . lisinopril (PRINIVIL,ZESTRIL) 20 MG tablet Take 10-20 mg by mouth 2 (two) times daily. Take 10mg  every morning and 20mg  every evening.      . living well with diabetes book MISC 1 each by Does not apply route once.  1 each  0  . oxyCODONE-acetaminophen (PERCOCET) 7.5-325 MG per tablet Take 1 tablet by mouth 3 (three) times daily.      Marland Kitchen oxymorphone (OPANA ER) 10 MG 12 hr tablet Take 10  mg by mouth at bedtime.       . pantoprazole (PROTONIX) 40 MG tablet Take 40 mg by mouth 2 (two) times daily.       . potassium chloride (K-DUR) 10 MEQ tablet Take 1 tablet (10 mEq total) by mouth 2 (two) times daily. Take extra 2 tabs when take Metolazone  70 tablet  3  . rosuvastatin (CRESTOR) 20 MG tablet Take 20 mg by mouth daily.      Marland Kitchen torsemide (DEMADEX) 20 MG tablet Take 2 tablets (40 mg total) by mouth 2 (two) times daily.  120 tablet  6  . traZODone (DESYREL) 100 MG tablet Take 100 mg by mouth at bedtime.        Review of Systems  Constitutional: Negative for fever and chills.  Eyes: Positive for visual disturbance (Blurry vision).  Respiratory: Negative for cough and shortness of  breath.   Cardiovascular: Negative for chest pain.  Gastrointestinal: Negative for abdominal pain.  Endocrine: Negative for polyuria.  Genitourinary: Negative for dysuria.  Neurological: Negative for dizziness, weakness, numbness and headaches.      Objective:   Physical Exam  Constitutional: He is oriented to person, place, and time. He appears well-developed and well-nourished.  HENT:  Head: Normocephalic and atraumatic.  Eyes: Conjunctivae and EOM are normal. Pupils are equal, round, and reactive to light.  Neck: Normal range of motion. Neck supple.  Cardiovascular: Normal rate, regular rhythm and normal heart sounds.  Exam reveals no gallop and no friction rub.   No murmur heard. Pulmonary/Chest: Effort normal and breath sounds normal. No respiratory distress. He has no wheezes. He has no rales. He exhibits no tenderness.  Abdominal: Soft. He exhibits no distension. There is no tenderness.  Musculoskeletal: Normal range of motion. He exhibits no edema and no tenderness.  Neurological: He is alert and oriented to person, place, and time. No cranial nerve deficit.  Skin: Skin is warm and dry.  Psychiatric: He has a normal mood and affect.    Filed Vitals:   05/17/13 0925  BP: 136/71  Pulse: 82  Temp: 97.1 F (36.2 C)      Assessment & Plan:   Please see problem-based charting.

## 2013-05-17 NOTE — Assessment & Plan Note (Signed)
Weight is up from 267 on 4/19 (during hospital admission) to 277 pounds today. However, dry weight is 276 pounds per HF notes; he was likely volume down/dehydrated in setting of DKA last week. His torsemide was restarted at half dose upon hospital discharge, this may need to be titrated up again if his weight continues to trend up. - Continue paramedicine via HF clinic - Followup volume status in the clinic in 2-3 weeks

## 2013-05-17 NOTE — Assessment & Plan Note (Signed)
BP Readings from Last 3 Encounters:  05/17/13 136/71  05/15/13 104/51  05/09/13 124/89    Lab Results  Component Value Date   NA 131* 05/14/2013   K 4.6 05/14/2013   CREATININE 1.28 05/14/2013    Assessment: Blood pressure control: controlled Progress toward BP goal:  at goal His blood pressure medications were held during admission due to an episode of hypotension. Upon discharge they restarted an adjusted regimen: Coreg 18.75 mg twice a day (same as prior dose), lisinopril 5 mg daily (was taking 10mg  every morning and 20mg  every evening), and torsemide 20 mg twice a day (half his usual dose). Clonidine 0.1 mg twice a day was discontinued (was being weaned off in outpatient setting anyway).   Plan: Medications:  continue current medications

## 2013-05-17 NOTE — Assessment & Plan Note (Addendum)
Cr bumped to a high of 2.18 during recent admission, but trended down to his baseline. Lisinopril was held during admission, but restarted at a lower dose upon discharge (5mg  daily). - Repeat BMP  ADDENDUM: BMP wnl, Cr 1.13.  BMET    Component Value Date/Time   NA 139 05/17/2013 1000   K 4.7 05/17/2013 1000   CL 107 05/17/2013 1000   CO2 25 05/17/2013 1000   GLUCOSE 221* 05/17/2013 1000   BUN 25* 05/17/2013 1000   CREATININE 1.13 05/17/2013 1000   CREATININE 1.28 05/14/2013 1717   CALCIUM 10.0 05/17/2013 1000   GFRNONAA 71 05/17/2013 1000   GFRNONAA 60* 05/14/2013 1717   GFRAA 82 05/17/2013 1000   GFRAA 69* 05/14/2013 1717

## 2013-05-17 NOTE — Patient Instructions (Addendum)
Thank you for your visit. - Please increase your mealtime insulin, the Novolog, to 12 units each meal (three times per day). - Continue to take 55 units of the Levemir at night. - Please continue to abstain from cocaine. Cocaine can cause your diabetes to go out of control and can even cause heart attack and stroke. - Please continue to followup with your nurse and paramedics at home. - I have refilled the lancets for your meter. Please call if you have issues with this prescription. - Please return to see [place] in 2-3 weeks and bring your meter.

## 2013-05-17 NOTE — Assessment & Plan Note (Addendum)
Lab Results  Component Value Date   HGBA1C 7.4 03/28/2013   HGBA1C 7.7* 01/06/2013   HGBA1C 5.8* 02/20/2012     Assessment: Diabetes control: fair control Progress toward A1C goal:  unchanged Comments: Patient is newly insulin-dependent after an admission for DKA in the setting of cocaine use  Plan: Medications:  Continue Levemir 55 units at bedtime, increase mealtime NovoLog to 12 units 3 times a day Home glucose monitoring: Frequency: 4 times a day Timing:   before meals and at bedtime Instruction/counseling given: reminded to bring blood glucose meter & log to each visit Other plans: Followup in 2-3 weeks for further insulin titration.

## 2013-05-18 NOTE — Progress Notes (Signed)
Case discussed with Dr. Boggala at the time of the visit.  We reviewed the resident's history and exam and pertinent patient test results.  I agree with the assessment, diagnosis, and plan of care documented in the resident's note. 

## 2013-05-18 NOTE — Discharge Summary (Signed)
Reviewed. Agree with documentation. I evaluated the patient on the day of the discharge and discussed the plan of care with my IM team. Please see the resident note for details of discharge.   Gurveer Colucci

## 2013-05-19 ENCOUNTER — Ambulatory Visit: Payer: PRIVATE HEALTH INSURANCE | Admitting: Internal Medicine

## 2013-05-23 ENCOUNTER — Encounter (HOSPITAL_COMMUNITY): Payer: Self-pay

## 2013-05-23 ENCOUNTER — Inpatient Hospital Stay (HOSPITAL_COMMUNITY): Admission: RE | Admit: 2013-05-23 | Payer: PRIVATE HEALTH INSURANCE | Source: Ambulatory Visit

## 2013-05-24 ENCOUNTER — Encounter (HOSPITAL_COMMUNITY): Payer: Self-pay

## 2013-05-24 ENCOUNTER — Other Ambulatory Visit: Payer: Self-pay | Admitting: *Deleted

## 2013-05-24 MED ORDER — GLUCOSE BLOOD VI STRP: ORAL_STRIP | Status: DC

## 2013-05-24 NOTE — Telephone Encounter (Signed)
Pt test 4 times a day

## 2013-05-25 ENCOUNTER — Inpatient Hospital Stay (HOSPITAL_COMMUNITY): Admission: RE | Admit: 2013-05-25 | Payer: PRIVATE HEALTH INSURANCE | Source: Ambulatory Visit

## 2013-05-25 ENCOUNTER — Other Ambulatory Visit: Payer: Self-pay | Admitting: Dietician

## 2013-05-25 DIAGNOSIS — E119 Type 2 diabetes mellitus without complications: Secondary | ICD-10-CM

## 2013-05-25 MED ORDER — GLUCOSE BLOOD VI STRP: ORAL_STRIP | Status: DC

## 2013-05-25 MED ORDER — ACCU-CHEK FASTCLIX LANCETS MISC: Status: DC

## 2013-05-25 MED ORDER — ACCU-CHEK NANO SMARTVIEW W/DEVICE KIT: PACK | Status: DC

## 2013-05-25 NOTE — Telephone Encounter (Signed)
Received call from triage forwarded from patient requesting new meter and supplies covered by his insurance.

## 2013-05-27 ENCOUNTER — Telehealth: Payer: Self-pay | Admitting: Internal Medicine

## 2013-05-27 NOTE — Telephone Encounter (Signed)
Called by patient he had a glucose of 42 this am.  He says he feels dizzy and lightheaded. He took a glucose pill and is about to eat breakfast.  He is doing Levemir 55 units qhs and Novology 12 u tid.  Advised him to eat something immediately more than just a snack recheck cbg in 30 min, if cbg <60 again come to the hospital for evaluation otherwise call and sch appt Monday/Tues with [facility]. Advised to reduce Levemir 55 units to 50 units qhs.    Aundra Dubin MD

## 2013-05-29 ENCOUNTER — Encounter: Payer: Self-pay | Admitting: Internal Medicine

## 2013-05-29 ENCOUNTER — Ambulatory Visit (INDEPENDENT_AMBULATORY_CARE_PROVIDER_SITE_OTHER): Payer: PRIVATE HEALTH INSURANCE | Admitting: Internal Medicine

## 2013-05-29 VITALS — BP 106/73 | HR 68 | Temp 99.5°F | Ht 71.0 in | Wt 272.3 lb

## 2013-05-29 DIAGNOSIS — E119 Type 2 diabetes mellitus without complications: Secondary | ICD-10-CM

## 2013-05-29 DIAGNOSIS — I10 Essential (primary) hypertension: Secondary | ICD-10-CM

## 2013-05-29 DIAGNOSIS — I509 Heart failure, unspecified: Secondary | ICD-10-CM

## 2013-05-29 DIAGNOSIS — I5042 Chronic combined systolic (congestive) and diastolic (congestive) heart failure: Secondary | ICD-10-CM

## 2013-05-29 LAB — GLUCOSE, CAPILLARY: GLUCOSE-CAPILLARY: 168 mg/dL — AB (ref 70–99)

## 2013-05-29 NOTE — Progress Notes (Signed)
Case discussed with Dr. Brown at the time of the visit.  We reviewed the resident's history and exam and pertinent patient test results.  I agree with the assessment, diagnosis, and plan of care documented in the resident's note. 

## 2013-05-29 NOTE — Patient Instructions (Addendum)
General Instructions: I'm worried about your low blood sugar.  To prevent this in the future, we are changing your insulin: -Take Levemir, 45 units, once every evening -Continue to take Novolog 10 units three times per day  Please return for a follow-up visit in 3 weeks.  Please schedule an appointment with Debera Lat, to discuss diabetes management.   Treatment Goals:  Goals (1 Years of Data) as of 05/29/13         05/17/13 05/15/13 05/15/13 05/14/13 05/14/13     Blood Pressure    . Blood Pressure < 140/90  136/71 104/51 113/84 101/65 140/78     Lifestyle    . Prevent Falls           Result Component    . HEMOGLOBIN A1C < 7.0          . LDL CALC < 100            Progress Toward Treatment Goals:  Treatment Goal 05/29/2013  Hemoglobin A1C unchanged  Blood pressure at goal    Self Care Goals & Plans:  Self Care Goal 05/09/2013  Manage my medications take my medicines as prescribed; bring my medications to every visit; refill my medications on time; follow the sick day instructions if I am sick  Monitor my health keep track of my blood glucose; keep track of my blood pressure; keep track of my weight; check my feet daily  Eat healthy foods eat more vegetables; eat fruit for snacks and desserts; eat baked foods instead of fried foods; eat smaller portions; drink diet soda or water instead of juice or soda  Be physically active find an activity I enjoy    Home Blood Glucose Monitoring 05/17/2013  Check my blood sugar 4 times a day     Care Management & Community Referrals:  Referral 05/29/2013  Referrals made for care management support none needed

## 2013-05-29 NOTE — Assessment & Plan Note (Addendum)
Lab Results  Component Value Date   HGBA1C 7.4 03/28/2013   HGBA1C 7.7* 01/06/2013   HGBA1C 5.8* 02/20/2012     Assessment: Diabetes control: fair control Progress toward A1C goal:  unchanged Comments: We will decrease insulin dose today to avoid hypoglycemia.  The patient should follow-up in 3 weeks, with glucometer, for further adjustment of insulin.  Plan: Medications:  Change levemir to 45 daily (from 60).  Continue Novolog 10 TID Home glucose monitoring: Frequency:   Timing:   Instruction/counseling given: reminded to bring blood glucose meter & log to each visit Educational resources provided:   Self management tools provided:   Other plans: Recheck in 3 weeks.  Pt unable to void for urine microalbumin today.  Schedule visit with Debera Lat for further teaching regarding monitoring diabetes.

## 2013-05-29 NOTE — Assessment & Plan Note (Signed)
BP Readings from Last 3 Encounters:  05/29/13 106/73  05/17/13 136/71  05/15/13 104/51    Lab Results  Component Value Date   NA 139 05/17/2013   K 4.7 05/17/2013   CREATININE 1.13 05/17/2013    Assessment: Blood pressure control: controlled Progress toward BP goal:  at goal Comments: Well-controlled  Plan: Medications:  Continue lisinopril, coreg, torsemide Educational resources provided:   Self management tools provided:   Other plans: Recheck at next visit

## 2013-05-29 NOTE — Progress Notes (Signed)
HPI The patient is a 58 y.o. male with a history of DM, schizophrenia, HCV, HTN, prior CVA, CKD, cirrhosis, presenting for an acute visit for hypoglycemia.  The patient was recently hospitalized 4/14-20 for DKA, and was started on insulin therapy at that time (previously on metformin monotherapy), with Levemir 55 units daily, and Novolog 10 with each meal, due to significant difficulty controlling hyperglycemia while inpatient.  Novolog was increased 4/22 to 12 units with each meal, in response to home glucometer results still showing some CBG's in the 500's.  Two days ago, the patient awoke with symptoms of lightheadedness and mild confusion, and checked his glucometer to find his CBG was 42.  No tremulousness, diaphoresis.  He ate some food, and his blood sugar came back into the low 100's, with resolution of symptoms.  Since that time, he has continued to take Levemir 55 units qhs, and Novolog 10 units TID, with blood sugars in the 100-200's (pt did not bring his glucometer with him today). CBG is 168 today in clinic.  The patient has a history of CHF.  He brings a weight log, which shows the following: Friday: 266 lbs Saturday: 274 lbs Sunday: 265 Monday: 264 He has an appointment with CHF clinic in 3 days.  He notes no orthopnea (1-pillow), PND, or LE edema, though he believes he has some abdominal swelling.  ROS: General: no fevers, chills, changes in appetite Skin: no rash HEENT: no blurry vision, hearing changes, sore throat Pulm: no dyspnea, coughing, wheezing CV: no chest pain, palpitations, shortness of breath Abd: no abdominal pain, nausea/vomiting, diarrhea/constipation GU: no dysuria, hematuria, polyuria Ext: no arthralgias, myalgias Neuro: no weakness, numbness, or tingling  Filed Vitals:   05/29/13 1513  BP: 106/73  Pulse: 68  Temp: 99.5 F (37.5 C)    PEX General: alert, cooperative, and in no apparent distress HEENT: pupils equal round and reactive to light,  vision grossly intact, oropharynx clear and non-erythematous  Neck: supple Lungs: clear to ascultation bilaterally, normal work of respiration, no wheezes, rales, ronchi Heart: regular rate and rhythm, no murmurs, gallops, or rubs Abdomen: obese, non-tender, not particularly distended, normal bowel sounds Extremities: trace bilateral non-pitting edema Neurologic: alert & oriented X3, cranial nerves II-XII intact, strength grossly intact, sensation intact to light touch  Current Outpatient Prescriptions on File Prior to Visit  Medication Sig Dispense Refill  . ACCU-CHEK FASTCLIX LANCETS MISC Use as instructed to test four times a day. DX: 250.92, insulin requiring  204 each  2  . ARIPiprazole (ABILIFY) 10 MG tablet Take 10 mg by mouth at bedtime.      . Blood Glucose Monitoring Suppl (ACCU-CHEK NANO SMARTVIEW) W/DEVICE KIT Use as instructed to test four times a day. DX: 250.92, insulin requiring  1 kit  0  . carvedilol (COREG) 12.5 MG tablet Take 18.75 mg by mouth 2 (two) times daily with a meal.      . glucose blood (ACCU-CHEK SMARTVIEW) test strip Use as instructed to test four times a day. DX: 250.92, insulin requiring  150 each  2  . insulin aspart (NOVOLOG) 100 UNIT/ML injection Inject 12 Units into the skin 3 (three) times daily with meals.  10 mL  11  . Insulin Detemir (LEVEMIR FLEXTOUCH) 100 UNIT/ML Pen Inject 55 Units into the skin daily at 10 pm.  15 mL  11  . Insulin Pen Needle (FIFTY50 PEN NEEDLES) 31G X 5 MM MISC Use to inject insulin up to 4 times daily. Insulin dependent. diag  code 250.00  150 each  5  . lisinopril (PRINIVIL,ZESTRIL) 20 MG tablet Take 10-20 mg by mouth 2 (two) times daily. Take 66m every morning and 219mevery evening.      . living well with diabetes book MISC 1 each by Does not apply route once.  1 each  0  . oxyCODONE-acetaminophen (PERCOCET) 7.5-325 MG per tablet Take 1 tablet by mouth 3 (three) times daily.      . Marland Kitchenxymorphone (OPANA ER) 10 MG 12 hr tablet  Take 10 mg by mouth at bedtime.       . pantoprazole (PROTONIX) 40 MG tablet Take 40 mg by mouth 2 (two) times daily.       . potassium chloride (K-DUR) 10 MEQ tablet Take 1 tablet (10 mEq total) by mouth 2 (two) times daily. Take extra 2 tabs when take Metolazone  70 tablet  3  . rosuvastatin (CRESTOR) 20 MG tablet Take 20 mg by mouth daily.      . Marland Kitchenorsemide (DEMADEX) 20 MG tablet Take 2 tablets (40 mg total) by mouth 2 (two) times daily.  120 tablet  6  . traZODone (DESYREL) 100 MG tablet Take 100 mg by mouth at bedtime.       No current facility-administered medications on file prior to visit.    Assessment/Plan

## 2013-05-29 NOTE — Assessment & Plan Note (Signed)
The patient has a history of CHF.  He notes an appointment with CHF clinic later this week.  Weights appear relatively stable (with 1 outlier), and patient currently with no physical exam signs of volume overload. -continue lisinopril, coreg, torsemide -continue daily weights -f/u with CHF clinic as scheduled

## 2013-05-31 ENCOUNTER — Ambulatory Visit (HOSPITAL_COMMUNITY)
Admission: RE | Admit: 2013-05-31 | Discharge: 2013-05-31 | Disposition: A | Discharge status: Home / Self Care | Payer: PRIVATE HEALTH INSURANCE | Source: Ambulatory Visit | Attending: Internal Medicine | Admitting: Internal Medicine

## 2013-05-31 ENCOUNTER — Encounter (HOSPITAL_COMMUNITY): Payer: Self-pay

## 2013-05-31 VITALS — BP 144/83 | HR 64 | Resp 20 | Wt 272.2 lb

## 2013-05-31 DIAGNOSIS — F1910 Other psychoactive substance abuse, uncomplicated: Secondary | ICD-10-CM

## 2013-05-31 DIAGNOSIS — Z8673 Personal history of transient ischemic attack (TIA), and cerebral infarction without residual deficits: Secondary | ICD-10-CM | POA: Insufficient documentation

## 2013-05-31 DIAGNOSIS — F1411 Cocaine abuse, in remission: Secondary | ICD-10-CM | POA: Insufficient documentation

## 2013-05-31 DIAGNOSIS — Z79899 Other long term (current) drug therapy: Secondary | ICD-10-CM | POA: Insufficient documentation

## 2013-05-31 DIAGNOSIS — I10 Essential (primary) hypertension: Secondary | ICD-10-CM

## 2013-05-31 DIAGNOSIS — F259 Schizoaffective disorder, unspecified: Secondary | ICD-10-CM | POA: Insufficient documentation

## 2013-05-31 DIAGNOSIS — B1920 Unspecified viral hepatitis C without hepatic coma: Secondary | ICD-10-CM | POA: Insufficient documentation

## 2013-05-31 DIAGNOSIS — E785 Hyperlipidemia, unspecified: Secondary | ICD-10-CM | POA: Insufficient documentation

## 2013-05-31 DIAGNOSIS — I5042 Chronic combined systolic (congestive) and diastolic (congestive) heart failure: Secondary | ICD-10-CM

## 2013-05-31 DIAGNOSIS — Z794 Long term (current) use of insulin: Secondary | ICD-10-CM | POA: Insufficient documentation

## 2013-05-31 DIAGNOSIS — N182 Chronic kidney disease, stage 2 (mild): Secondary | ICD-10-CM | POA: Insufficient documentation

## 2013-05-31 DIAGNOSIS — I519 Heart disease, unspecified: Secondary | ICD-10-CM | POA: Insufficient documentation

## 2013-05-31 DIAGNOSIS — K7689 Other specified diseases of liver: Secondary | ICD-10-CM | POA: Insufficient documentation

## 2013-05-31 DIAGNOSIS — Z87891 Personal history of nicotine dependence: Secondary | ICD-10-CM | POA: Insufficient documentation

## 2013-05-31 DIAGNOSIS — E781 Pure hyperglyceridemia: Secondary | ICD-10-CM | POA: Insufficient documentation

## 2013-05-31 DIAGNOSIS — E119 Type 2 diabetes mellitus without complications: Secondary | ICD-10-CM | POA: Insufficient documentation

## 2013-05-31 DIAGNOSIS — Q211 Atrial septal defect: Secondary | ICD-10-CM | POA: Insufficient documentation

## 2013-05-31 DIAGNOSIS — Q2111 Secundum atrial septal defect: Secondary | ICD-10-CM | POA: Insufficient documentation

## 2013-05-31 DIAGNOSIS — I509 Heart failure, unspecified: Secondary | ICD-10-CM

## 2013-05-31 DIAGNOSIS — F1011 Alcohol abuse, in remission: Secondary | ICD-10-CM | POA: Insufficient documentation

## 2013-05-31 DIAGNOSIS — I129 Hypertensive chronic kidney disease with stage 1 through stage 4 chronic kidney disease, or unspecified chronic kidney disease: Secondary | ICD-10-CM | POA: Insufficient documentation

## 2013-05-31 MED ORDER — LISINOPRIL 20 MG PO TABS: 20.0000 mg | ORAL_TABLET | Freq: Two times a day (BID) | ORAL | Status: DC

## 2013-05-31 NOTE — Progress Notes (Signed)
Patient ID: [name] [name], male   DOB: Jun 09, 1955, 58 y.o.   MRN: [id number]  Primary Care: Internal Medicine clinic, Dr. Silverio Decamp Primary Cardiologist: N/A GI: Dr Ardis Hughs.   HPI: [name] [name] is a 26 y.o. 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   Former cocaine abuse. Had history of acute renal failure in setting of rhabdo requiring short term HD.    Admitted with new onset HF in April 2014.   He was diuresed and discharged home.  Discharge weight 256 pounds. Echo at the time showed EF 13-24%, grade 1 diastolic dysfunction.      Cath 04/27/12 [facility] RA = 4  RV = 45/3/4  PA = 42/9 (24)  PCW = 14  Fick cardiac output/index = 5.2/2.2  PVR = 1.9 [place]  SVR = 1656  FA sat = 96%  PA sat = 62%, 64% Ao Pressure: 149/84 (11)  LV Pressure: 142/8/29  There was no signficant gradient across the aortic valve on pullback. Arteries normal. LV gram EF= 35-40% global HK  Admitted to [facility] 5/30 through 06/27/12 with GI bleed. EGD 06/25/12 multiple ulcers noted. Continued on protonix.   ECHO 10/27/12 EF 40-45%   Admitted 04/2013: Admitted for DKA and was UDS + for cocaine. Started on insulin.   Follow up: Since last visit was admitted to the hospital in 04/2013 with DKA and was started on insulin. Reports he has not used cocaine since last admission.  Denies SOB, CP, orthopnea or edema. Weight at home 268-272 lbs. Can go about 2 blocks before stopping, but usually has to stop d/t back. Able to go upstairs. Taking all medications as prescribed. Trying to follow a low salt diet and drink less than 2L a day.   Labs 02/12/13 K 4.3 Creatinine 0.98          05/17/13 K 4.7, creatinine 1.13    SH: Quit smoking 12 years ago.. No alcohol for about a year. Lives alone, Disabled.  FH: Mom died from MI in her early 45s  Brother MI   Review of Systems: All pertinent positives and negatives as in HPI, otherwise negative.    Past  Medical History  Diagnosis Date  . GERD (gastroesophageal reflux disease)   . Schizophrenia, schizo-affective   . Cataract   . Hyperlipidemia   . Wears glasses   . Wears dentures     upper  . Hypertension     uncontrolled with medication noncompliance  . Hepatitis C   . HIT (heparin-induced thrombocytopenia)   . Continuous chronic alcoholism   . H/O cocaine abuse     none in 6 months  . Stroke 01/2012    Small cerebellar infarcts right greater than left as well as questionable acute left external capsule and caudate nuclear punctate lacunar infarcts noted per MRI (01/2012) - presumed to be embolic likely source PFO with right to left shunt (noted per TEE 01/ 2014)  . Chronic combined systolic and diastolic CHF (congestive heart failure) 02/29/2012    LV EF 40-45% per 2D echo (02/2012) with grade 1 diastolic dysfunction. 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 Degenerative lumbar spinal stenosis     s/p L2-3, L3-4, L4-5 laminectomy partial facetectomy, and bilateral foraminotomy  . History of pancreatitis 01/2011    Admission for acute pancreatitis presumed 2/2 ongoing alcohol abuse- and hypertriglyceridemia  .  PFO (patent foramen ovale) 01/2012    with right to left shunt, noted per TEE in evaluation for source of embolic stroke in 06/3843  . CKD (chronic kidney disease) stage 2, GFR 60-89 ml/min     BL SCr approximately 1-1.3  . 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 Hepatic steatosis     suspected 2/2 alcohol abuse  . Splenic cyst   . Colitis 05/2009    History of colitis of ascending colon noted on CT abd/pelvis (05/2009), with interval resolution with subsequent CT  . Hypertriglyceridemia   . Cancer     Prostate cancer-bx. 3 weeks ago  . History of ETOH abuse 12-13-12     no use in 6 months    Current Outpatient Prescriptions  Medication Sig  Dispense Refill  . ACCU-CHEK FASTCLIX LANCETS MISC Use as instructed to test four times a day. DX: 250.92, insulin requiring  204 each  2  . ARIPiprazole (ABILIFY) 10 MG tablet Take 10 mg by mouth at bedtime.      . Blood Glucose Monitoring Suppl (ACCU-CHEK NANO SMARTVIEW) W/DEVICE KIT Use as instructed to test four times a day. DX: 250.92, insulin requiring  1 kit  0  . carvedilol (COREG) 12.5 MG tablet Take 18.75 mg by mouth 2 (two) times daily with a meal.      . cloNIDine (CATAPRES) 0.1 MG tablet Take 0.1 mg by mouth 2 (two) times daily.      Marland Kitchen glucose blood (ACCU-CHEK SMARTVIEW) test strip Use as instructed to test four times a day. DX: 250.92, insulin requiring  150 each  2  . insulin aspart (NOVOLOG) 100 UNIT/ML injection Inject 12 Units into the skin 3 (three) times daily with meals.  10 mL  11  . Insulin Detemir (LEVEMIR FLEXTOUCH) 100 UNIT/ML Pen Inject 55 Units into the skin daily at 10 pm.  15 mL  11  . Insulin Pen Needle (FIFTY50 PEN NEEDLES) 31G X 5 MM MISC Use to inject insulin up to 4 times daily. Insulin dependent. diag code 250.00  150 each  5  . lisinopril (PRINIVIL,ZESTRIL) 20 MG tablet Take 10-20 mg by mouth 2 (two) times daily. Take 56m every morning and 235mevery evening.      . living well with diabetes book MISC 1 each by Does not apply route once.  1 each  0  . metolazone (ZAROXOLYN) 2.5 MG tablet Take 2.5 mg by mouth as needed.      . Marland KitchenxyCODONE-acetaminophen (PERCOCET) 7.5-325 MG per tablet Take 1 tablet by mouth 3 (three) times daily.      . Marland Kitchenxymorphone (OPANA ER) 10 MG 12 hr tablet Take 10 mg by mouth at bedtime.       . pantoprazole (PROTONIX) 40 MG tablet Take 40 mg by mouth 2 (two) times daily.       . potassium chloride (K-DUR) 10 MEQ tablet Take 1 tablet (10 mEq total) by mouth 2 (two) times daily. Take extra 2 tabs when take Metolazone  70 tablet  3  . rosuvastatin (CRESTOR) 20 MG tablet Take 20 mg by mouth daily.      . Marland Kitchenorsemide (DEMADEX) 20 MG tablet Take 2  tablets (40 mg total) by mouth 2 (two) times daily.  120 tablet  6  . traZODone (DESYREL) 100 MG tablet Take 100 mg by mouth at bedtime.       No current facility-administered medications for this encounter.  Allergies  Allergen Reactions  . Heparin Other (See Comments)    Documented HIT under problem list Pt states he's not allergic.  Marland Kitchen Thorazine [Chlorpromazine Hcl] Other (See Comments)    Body freezes up     Filed Vitals:   05/31/13 1456  BP: 144/83  Pulse: 64  Resp: 20  Weight: 272 lb 4 oz (123.492 kg)  SpO2: 98%   PHYSICAL EXAM: General:  NAD. No respiratory difficulty; HEENT: normal Neck: supple. JVP difficult to see due to body habitus, but does not appear elelvated. Carotids 2+ bilat; no bruits. No lymphadenopathy or thryomegaly appreciated. Cor: PMI nondisplaced. Regular rate & rhythm. No rubs, gallops, Very soft TR murmur. Lungs: clear Abdomen: soft, nontender, no distention. No hepatosplenomegaly. No bruits or masses. Good bowel sounds. Extremities: no cyanosis, clubbing, rash, no edema Neuro: alert & oriented x 3, cranial nerves grossly intact. moves all 4 extremities w/o difficulty. Flat Affect   ASSESSMENT /PLAN  1) Chronic systolic/diastolic HF, NICM, EF 49-35% (10/2012) - NYHA II-III symptoms and volume status stable. Will continue torsemide 40 mg BID and metolazone 2.5 mg as needed. Discussed the use of sliding scale diuretics and to take metolazone if weight gain of 5 lbs, take extra 20 meq of potassium if he takes metolazone. - Will not increased coreg with fatigue, continue 18.75 mg BID - Increase lisinopril to 20 mg BID. Check BMET at IM clinic in 2 weeks.  - Would like to eventually transition off clonidine and use spiro if possible.  - Reinforced the need and importance of daily weights, a low sodium diet, and fluid restriction (less than 2 L a day). Instructed to call the HF clinic if weight increases more than 3 lbs overnight or 5 lbs in a week.   2) HTN - As above will increase lisinopril and put new label on patients medication bottle Would like to get the patient off clonidine and use spiro. If this does not control BP could add norvasc.  3) Cocaine use - Discussed the need to abstain from cocaine. He understands and reports he has not used since last visit which I congratulated him for.   F/U 6 weeks Rande Brunt NP-C  3:32 PM        Increase lisinopril to 20 mg in the am 20 mg in the pm; F/u 6 weeks

## 2013-05-31 NOTE — Patient Instructions (Signed)
Increase your lisinopril to 20 mg (1 tablet) in the morning and 20 mg (1 tablet) in the evening.  Follow up in 6 weeks.  Will get blood work at [facility] in 2 weeks.  Call any issues  Do the following things EVERYDAY: 1) Weigh yourself in the morning before breakfast. Write it down and keep it in a log. 2) Take your medicines as prescribed 3) Eat low salt foods-Limit salt (sodium) to 2000 mg per day.  4) Stay as active as you can everyday 5) Limit all fluids for the day to less than 2 liters 6)

## 2013-06-02 ENCOUNTER — Telehealth: Payer: Self-pay | Admitting: *Deleted

## 2013-06-02 NOTE — Telephone Encounter (Signed)
Patient reports feeling weak. He clarified that he had eaten a chicken salad sandwich for lunch today and the chicken and greens were a snack. His blood sugar is usually 150-200 and he is new to insulin. He says he is " not used to being this low".  He took 10 units Novolog at noon (confirmed 45 units levemir once daily and 10 units Novolog three times a day)  and when he rechecked his blood sugar while we were on the phone it was 109, He reports his blood sugar was > 200 this am and he may have been a bit more active than usual. He has not been dizzy or lightheaded since insulin was decreased at his last doctor's appointment.    He agreed to drink 1/2 cup of juice or eat 2 more glucose tablets now and be sure to check his blood sugar before meals and bedtime and carry glucose tablets with him when he goes out. Offered earlier appointment than 06/12/13, patient denied need. Asked hi to call if this happens again.

## 2013-06-02 NOTE — Telephone Encounter (Signed)
Attending physician: Above issues reviewed with nurse and I agree with recommendations made to the patient. Discussion may need further reductions in insulin dose. Murriel Hopper, M.D., [place].

## 2013-06-02 NOTE — Telephone Encounter (Signed)
Pt called stating he was getting dizzy and light headed, he checked his sugar and it was 88.  He took a glucose pill and now increased to 90 after 15 minutes.   He also ate Chicken and greens for lunch.   He had low sugar once before and felt a little like this.  He is still feeling dizzy and lightheaded but a little better. Someone is with pt.  I transferred call to [facility] for advise.

## 2013-06-12 ENCOUNTER — Ambulatory Visit (INDEPENDENT_AMBULATORY_CARE_PROVIDER_SITE_OTHER): Payer: PRIVATE HEALTH INSURANCE | Admitting: Internal Medicine

## 2013-06-12 ENCOUNTER — Ambulatory Visit (INDEPENDENT_AMBULATORY_CARE_PROVIDER_SITE_OTHER): Payer: PRIVATE HEALTH INSURANCE | Admitting: Dietician

## 2013-06-12 ENCOUNTER — Encounter: Payer: Self-pay | Admitting: Internal Medicine

## 2013-06-12 VITALS — BP 156/94 | HR 81 | Temp 98.7°F | Ht 71.0 in | Wt 276.7 lb

## 2013-06-12 DIAGNOSIS — E119 Type 2 diabetes mellitus without complications: Secondary | ICD-10-CM

## 2013-06-12 DIAGNOSIS — G8929 Other chronic pain: Secondary | ICD-10-CM

## 2013-06-12 DIAGNOSIS — I5042 Chronic combined systolic (congestive) and diastolic (congestive) heart failure: Secondary | ICD-10-CM

## 2013-06-12 DIAGNOSIS — I10 Essential (primary) hypertension: Secondary | ICD-10-CM

## 2013-06-12 DIAGNOSIS — I509 Heart failure, unspecified: Secondary | ICD-10-CM

## 2013-06-12 DIAGNOSIS — M549 Dorsalgia, unspecified: Secondary | ICD-10-CM

## 2013-06-12 LAB — GLUCOSE, CAPILLARY: Glucose-Capillary: 132 mg/dL — ABNORMAL HIGH (ref 70–99)

## 2013-06-12 LAB — BASIC METABOLIC PANEL WITH GFR
BUN: 13 mg/dL (ref 6–23)
CHLORIDE: 103 meq/L (ref 96–112)
CO2: 28 meq/L (ref 19–32)
CREATININE: 1.06 mg/dL (ref 0.50–1.35)
Calcium: 10.8 mg/dL — ABNORMAL HIGH (ref 8.4–10.5)
GFR, Est African American: 88 mL/min
GFR, Est Non African American: 76 mL/min
GLUCOSE: 119 mg/dL — AB (ref 70–99)
Potassium: 4.3 mEq/L (ref 3.5–5.3)
Sodium: 142 mEq/L (ref 135–145)

## 2013-06-12 MED ORDER — CYCLOBENZAPRINE HCL 7.5 MG PO TABS: 7.5000 mg | ORAL_TABLET | Freq: Three times a day (TID) | ORAL | Status: DC | PRN

## 2013-06-12 NOTE — Assessment & Plan Note (Signed)
BP Readings from Last 3 Encounters:  06/12/13 156/94  05/31/13 144/83  05/29/13 106/73    Lab Results  Component Value Date   NA 139 05/17/2013   K 4.7 05/17/2013   CREATININE 1.13 05/17/2013    Assessment: Blood pressure control: mildly elevated Progress toward BP goal:  unchanged Comments: Patient is complaining of low back pain  Plan: Medications:  continue current medications Other plans: We'll not adjust medications at this time as patient is in pain, and the heart failure clinic is actively working to titrate his medications.

## 2013-06-12 NOTE — Progress Notes (Signed)
INTERNAL MEDICINE TEACHING ATTENDING ADDENDUM - Dorcas Melito, MD: I reviewed and discussed at the time of visit with the resident Dr. Cater, the patient's medical history, physical examination, diagnosis and results of tests and treatment and I agree with the patient's care as documented. 

## 2013-06-12 NOTE — Progress Notes (Signed)
Subjective:    Patient ID: [name] [name], male    DOB: 12/31/1955, 58 y.o.   MRN: [id number]  HPI  [name] [name] is a 58 y.o. male with PMH significant for DM2 (A1C 7.4 on 03/28/13, recent admission for DKA now on insulin), chronic combined systolic and diastolic CHF (dx 73/2202, 2D echo 10/27/12 EF 54-27%, grade 2 diastolic dysfunction, followed by Dr. Doylene Canard), HTN, schizophrenia, hepatitis C Ab positive, cerebellar strokes (dx 01/2012), cocaine abuse who presented for routine follow up.  Low back pain - Patient has a history of chronic low back pain. He is status post lumbar fusion in 04/2011 for L3-4, L4-5 degenerative disk disease. He sees [facility] Solian [facility] for Percocet 7.5-325 #90 per month and Opana ER $Remove'20mg'XikDtSb$  #60 per month. His last refill was 05/02/2013, he is now out of pain medicine and complaining of worsening back pain. It is sharp in nature and does not radiate. He denies bowel or bladder incontinence. He says he called the clinic but they did not schedule him a follow up appointment. He is concerned they saw he tested cocaine positive during his last admission. However, they have NOT yet told him he was discharged from the clinic for breach of contract. In fact they told him he was STILL a patient.  DM2 - On 05/29/13 Levemir was decreased to 45 units daily secondary to documented hypoglycemia of 42. NovoLog dose was maintained at 10 units 3 times a day with meals. On 05/08 he called the clinic with a symptomatic low blood sugar of 88, which resolved with juice and glucose tablets. No symptomatic lows since that time.  CHF - Patient was seen in the heart failure clinic on 05/31/2013. He was instructed to continue torsemide 40 mg twice a day and a sliding scale of metolazone 2.5 mg when necessary, with parameters to take this if he gains 5 pounds along with additional 20 mEq of potassium supplementation. He has not had to use the sliding scale diuretic  recently. He was instructed to continue Coreg 18.75 mg twice a day, but increase lisinopril to 20 mg twice a day. The provider would like to eventually get the patient off clonidine and use spironolactone instead. They would like [place] to check a BMP today.  Cocaine abuse - Patient reports continued abstinence.   Current Outpatient Prescriptions on File Prior to Visit  Medication Sig Dispense Refill  . ACCU-CHEK FASTCLIX LANCETS MISC Use as instructed to test four times a day. DX: 250.92, insulin requiring  204 each  2  . ARIPiprazole (ABILIFY) 10 MG tablet Take 10 mg by mouth at bedtime.      . Blood Glucose Monitoring Suppl (ACCU-CHEK NANO SMARTVIEW) W/DEVICE KIT Use as instructed to test four times a day. DX: 250.92, insulin requiring  1 kit  0  . carvedilol (COREG) 12.5 MG tablet Take 18.75 mg by mouth 2 (two) times daily with a meal.      . cloNIDine (CATAPRES) 0.1 MG tablet Take 0.1 mg by mouth 2 (two) times daily.      Marland Kitchen glucose blood (ACCU-CHEK SMARTVIEW) test strip Use as instructed to test four times a day. DX: 250.92, insulin requiring  150 each  2  . insulin aspart (NOVOLOG) 100 UNIT/ML injection Inject 12 Units into the skin 3 (three) times daily with meals.  10 mL  11  . Insulin Detemir (LEVEMIR FLEXTOUCH) 100 UNIT/ML Pen Inject 55 Units into the skin daily at 10 pm.  15 mL  11  . Insulin Pen Needle (FIFTY50 PEN NEEDLES) 31G X 5 MM MISC Use to inject insulin up to 4 times daily. Insulin dependent. diag code 250.00  150 each  5  . lisinopril (PRINIVIL,ZESTRIL) 20 MG tablet Take 1 tablet (20 mg total) by mouth 2 (two) times daily.  60 tablet  3  . living well with diabetes book MISC 1 each by Does not apply route once.  1 each  0  . metolazone (ZAROXOLYN) 2.5 MG tablet Take 2.5 mg by mouth as needed.      Marland Kitchen oxyCODONE-acetaminophen (PERCOCET) 7.5-325 MG per tablet Take 1 tablet by mouth 3 (three) times daily.      Marland Kitchen oxymorphone (OPANA ER) 10 MG 12 hr tablet Take 10 mg by mouth at bedtime.        . pantoprazole (PROTONIX) 40 MG tablet Take 40 mg by mouth 2 (two) times daily.       . potassium chloride (K-DUR) 10 MEQ tablet Take 1 tablet (10 mEq total) by mouth 2 (two) times daily. Take extra 2 tabs when take Metolazone  70 tablet  3  . rosuvastatin (CRESTOR) 20 MG tablet Take 20 mg by mouth daily.      Marland Kitchen torsemide (DEMADEX) 20 MG tablet Take 2 tablets (40 mg total) by mouth 2 (two) times daily.  120 tablet  6  . traZODone (DESYREL) 100 MG tablet Take 100 mg by mouth at bedtime.        Review of Systems  Constitutional: Negative for fever and unexpected weight change.  Respiratory: Negative for shortness of breath.   Cardiovascular: Negative for chest pain.  Musculoskeletal: Positive for back pain. Negative for joint swelling and neck pain.  Neurological: Negative for weakness and numbness.  No bowel or bladder incontinence.     Objective:   Physical Exam  Constitutional: He is oriented to person, place, and time. He appears well-developed and well-nourished.  HENT:  Head: Normocephalic and atraumatic.  Eyes: Conjunctivae and EOM are normal. Pupils are equal, round, and reactive to light.  Neck: Normal range of motion. Neck supple. No JVD present.  Cardiovascular: Normal rate and regular rhythm.  Exam reveals no gallop and no friction rub.   No murmur heard. Pulmonary/Chest: Effort normal and breath sounds normal. No respiratory distress. He has no wheezes. He has no rales. He exhibits no tenderness.  Abdominal: Soft. He exhibits no distension. There is no tenderness.  Musculoskeletal: Normal range of motion. He exhibits no edema.  Paraspinal tenderness in the lumbar area.  Neurological: He is alert and oriented to person, place, and time.  Skin: Skin is warm and dry.  Psychiatric: He has a normal mood and affect.     Filed Vitals:   06/12/13 0936  BP: 156/94  Pulse: 81  Temp: 98.7 F (37.1 C)   Filed Weights   06/12/13 0936  Weight: 276 lb 11.2 oz (125.51  kg)   05/31/13 weight 272 lb 4 oz     Assessment & Plan:   Please see problem based charting.

## 2013-06-12 NOTE — Assessment & Plan Note (Addendum)
He is likely having breakthrough chronic back pain 2/2 running out of his pain medicine. No red flags. His cocaine use earlier this spring would be a breach of his pain contract, but apparently [facility] has not formally fired the patient yet. - I will not refill narcotics while he is still a pain management patient - Instructed the patient to call his pain clinic and ask for an appointment this week - If they tell him he is indeed discharged from the clinic due to drug use, he can call the [facility] for another appointment to discuss where to go from there - In the meantime I have prescribed Flexmid 7.5 mg TID, muscle relaxer for paraspinal tenderness on exam

## 2013-06-12 NOTE — Progress Notes (Signed)
Diabetes Self-Management Education  Visit Number: First/Initial  06/12/2013 [name], identified by name and date of birth, is a 58 y.o. male with Diabetes Type: Type 2.        ASSESSMENT  Patient Concerns:  Medication;Monitoring;Glycemic Control  Vitals in office visit  Lab Results: LDL Cholesterol  Date Value Ref Range Status  04/03/2013 46  0 - 99 mg/dL Final           Hemoglobin A1C  Date Value Ref Range Status  03/28/2013 7.4   Final  01/06/2013 7.7* <5.7 % Final                                                                                Family History  Problem Relation Age of Onset  . CAD Mother 86  . CAD Sister   . CAD Brother 24    died from MI at age 85yo  . Hypertension     History  Substance Use Topics  . Smoking status: Former Smoker -- 0.50 packs/day for 30 years    Types: Cigarettes    Quit date: 06/24/2001  . Smokeless tobacco: Not on file  . Alcohol Use: No     Comment: Quit about several months ago.    Support Systems:    need to assess at future visit Special Needs:    need to assess at future visit Prior DM Education:  at [facility]- only attended one class- ? Core 1? Daily Foot Exams:   need to assess at future visit Patient Belief / Attitude about Diabetes:  Diabetes can be controlled  Assessment comments: patient with specific concerns he wanted addressed today.  Diet Recall: need to assess at future visit   Individualized Plan for Diabetes Self-Management Training:  Patient individualized diabetes plan discussed today with patient and includes:  medications, monitoring, , how to handle lows,   Education Topics Reviewed with Patient Today:  Topic Points Discussed  Disease State    Nutrition Management    Physical Activity and Exercise    Medications Reviewed patients medication for diabetes, action, purpose, timing of dose and side effects.  Monitoring Taught/evaluated SMBG with accu chek meter meter.  Acute Complications  Taught treatment of hypoglycemia - the 15 rule.  Chronic Complications    Psychosocial Adjustment    Goal Setting    Preconception Care (if applicable)      PATIENTS GOALS   Learning Objective(s):     Goal The patient agrees to:  Nutrition    Physical Activity    Medications    Monitoring    Problem Solving    Reducing Risk    Health Coping Make follow up appointment   Patient Self-Evaluation of Goals (Subsequent Visits)  Goal The patient rates self as meeting goals (% of time)  Nutrition    Physical Activity    Medications    Monitoring    Problem Solving    Reducing Risk    Health Coping       PERSONALIZED PLAN / SUPPORT  Self-Management Support:  Doctor's office;CDE visits ______________________________________________________________________  Outcomes  Expected Outcomes:  Demonstrated interest in learning. Expect positive outcomes Self-Care Barriers:  Lack of material resources  Education material provided: yes If problems or questions, patient to contact team via:  Phone Time in: 1030     Time out: 1100  Future DSME appointment: - 2 months   [name] [name] [name] [name] 06/12/2013 11:44 AM

## 2013-06-12 NOTE — Assessment & Plan Note (Addendum)
Weight is stable. Denies chest pain, shortness of breath, orthopnea. - Continue regular heart failure clinic followup - Continue torsemide, Coreg, lisinopril, clonidine, metolazone when necessary - Will re-check a BMP today at the request of the HF team  ADDENDUM:  BMET    Component Value Date/Time   NA 142 06/12/2013 1022   K 4.3 06/12/2013 1022   CL 103 06/12/2013 1022   CO2 28 06/12/2013 1022   GLUCOSE 119* 06/12/2013 1022   BUN 13 06/12/2013 1022   CREATININE 1.06 06/12/2013 1022   CREATININE 1.28 05/14/2013 1717   CALCIUM 10.8* 06/12/2013 1022   GFRNONAA 76 06/12/2013 1022   GFRNONAA 60* 05/14/2013 1717   GFRAA 88 06/12/2013 1022   GFRAA 69* 05/14/2013 1717

## 2013-06-12 NOTE — Assessment & Plan Note (Signed)
Lab Results  Component Value Date   HGBA1C 7.4 03/28/2013   HGBA1C 7.7* 01/06/2013   HGBA1C 5.8* 02/20/2012     Assessment: Diabetes control: fair control Progress toward A1C goal:  unchanged Comments: He brings his meter today. It shows CBGs in the 140-160s with no lows. He has some highs >200 in the evening pre-dinner.  Plan: Medications:  Continue Levemir 45 units every morning, continue NovoLog 10 units with breakfast and dinner, increase lunchtime NovoLog dose to 12 units Home glucose monitoring: Frequency:   4 times a day Timing:   before meals and at bedtime Instruction/counseling given: reminded to bring blood glucose meter & log to each visit and reminded to bring medications to each visit Other plans: He is due for a urine microalbumin ratio. He will see Butch Penny after this visit.

## 2013-06-12 NOTE — Patient Instructions (Signed)
Thank you for your visit. - Please increase your lunchtime dose of NovoLog 12 units. Continue to take 10 units with breakfast and dinner. - Continue to take Levemir 45 units in the morning daily. - Please call your pain clinic and schedule an appointment as soon as possible. - I cannot prescribe narcotic pain medicine while you're still under contract with them. - If they talk to you at your visit and say you have been discharged from the clinic due to cocaine use, please call us and we can see you here again about your pain medicine. - In the meantime, I will prescribe a muscle relaxer as your pain is mostly in the muscles on your exam. - Please continue to not use cocaine. - Please continue to followup in the heart failure clinic. - Please see Butch Penny after our visit. - I am doing some lab work today. I will call you if anything is abnormal. - Please return in one month.

## 2013-06-13 ENCOUNTER — Telehealth: Payer: Self-pay | Admitting: *Deleted

## 2013-06-13 LAB — MICROALBUMIN / CREATININE URINE RATIO
CREATININE, URINE: 177.3 mg/dL
MICROALB UR: 21.35 mg/dL — AB (ref 0.00–1.89)
Microalb Creat Ratio: 120.4 mg/g — ABNORMAL HIGH (ref 0.0–30.0)

## 2013-06-13 NOTE — Telephone Encounter (Signed)
Call from [place] at  [facility]  - wanting to know if pt is on a sliding scale and if pt is in insulin. Apparently pt is scheduled for a steroid injection and the last time pt's blood glucose was high. Informed him pt is not on a sliding scale and he is on insulin - Novolog and Levemir.

## 2013-06-15 ENCOUNTER — Telehealth: Payer: Self-pay | Admitting: Internal Medicine

## 2013-06-15 NOTE — Telephone Encounter (Signed)
I spoke with patient today. He complains of uncontrolled back pain and states that he is scheduled for a procedure for his back at the pain clinic on June 18th but he has run out of pain meds on May 7th. I called pain clinic and they said that since he has tested positive for cocaine they won't be able to renew his pain meds but that they hvaen't fired him from their clinic. They suggested patient go to the ED if his pain is uncontrolled. I will advise patient of the same and also to make an appointment with our clinic for follow up

## 2013-06-15 NOTE — Telephone Encounter (Signed)
Attempted to call [name] to discuss my conversation with the pain clinic. Left a voicemail and asked him to call the clinic

## 2013-06-20 ENCOUNTER — Telehealth: Payer: Self-pay | Admitting: *Deleted

## 2013-06-20 NOTE — Telephone Encounter (Signed)
Pt calls and states his cbg is 79 and he is due to take another shot at 1700, doesn't know if he needs to, could you please call him at [phone]

## 2013-06-20 NOTE — Telephone Encounter (Addendum)
Asked to call patient to answer his question about taking dinner insulin with CBg of 89. He reports No symptoms, took 8 units of novolog at lunch- ate grilled cheese, green peas, water, took 10 units novolog at breakfast and reports dinner dose of e 10 units . ( patient says these doses are what he was told to do at his last visit. CBG this am was 140.  Last took 45 units Levemir yesterday evening.  Explained target before meal blood sugars is 90-130 and Encouraged patient to go ahead and take his dinner Novolog and eat 15 minutes afterwards and commended him for taking very good care of his diabetes.  Patient also asked about pain medication, but he decided to wait until next visit to discuss this with his doctor.

## 2013-06-21 NOTE — Telephone Encounter (Signed)
Thank you.. I agree with Donna's recommendation

## 2013-06-22 ENCOUNTER — Ambulatory Visit: Payer: PRIVATE HEALTH INSURANCE | Admitting: Internal Medicine

## 2013-07-12 ENCOUNTER — Ambulatory Visit: Payer: PRIVATE HEALTH INSURANCE | Admitting: Internal Medicine

## 2013-07-12 ENCOUNTER — Telehealth: Payer: Self-pay | Admitting: *Deleted

## 2013-07-12 ENCOUNTER — Encounter (HOSPITAL_COMMUNITY): Payer: PRIVATE HEALTH INSURANCE

## 2013-07-12 ENCOUNTER — Encounter: Payer: Self-pay | Admitting: Internal Medicine

## 2013-07-12 ENCOUNTER — Ambulatory Visit (INDEPENDENT_AMBULATORY_CARE_PROVIDER_SITE_OTHER): Payer: PRIVATE HEALTH INSURANCE | Admitting: Internal Medicine

## 2013-07-12 VITALS — BP 124/79 | HR 61 | Temp 98.5°F | Resp 18 | Ht 72.0 in | Wt 286.9 lb

## 2013-07-12 DIAGNOSIS — E119 Type 2 diabetes mellitus without complications: Secondary | ICD-10-CM

## 2013-07-12 DIAGNOSIS — I509 Heart failure, unspecified: Secondary | ICD-10-CM

## 2013-07-12 DIAGNOSIS — I5042 Chronic combined systolic (congestive) and diastolic (congestive) heart failure: Secondary | ICD-10-CM

## 2013-07-12 DIAGNOSIS — I10 Essential (primary) hypertension: Secondary | ICD-10-CM

## 2013-07-12 DIAGNOSIS — G8929 Other chronic pain: Secondary | ICD-10-CM

## 2013-07-12 DIAGNOSIS — I129 Hypertensive chronic kidney disease with stage 1 through stage 4 chronic kidney disease, or unspecified chronic kidney disease: Secondary | ICD-10-CM

## 2013-07-12 DIAGNOSIS — M549 Dorsalgia, unspecified: Secondary | ICD-10-CM

## 2013-07-12 LAB — POCT GLYCOSYLATED HEMOGLOBIN (HGB A1C): Hemoglobin A1C: 6.9

## 2013-07-12 LAB — GLUCOSE, CAPILLARY: GLUCOSE-CAPILLARY: 90 mg/dL (ref 70–99)

## 2013-07-12 NOTE — Assessment & Plan Note (Signed)
Lab Results  Component Value Date   HGBA1C 6.9 07/12/2013   HGBA1C 7.4 03/28/2013   HGBA1C 7.7* 01/06/2013     Assessment: Diabetes control: good control (HgbA1C at goal) Progress toward A1C goal:  improved Comments: No hypoglycemic episodes.   Plan: Medications:  continue current medications Home glucose monitoring: Frequency: once a day Timing:   Instruction/counseling given: reminded to bring blood glucose meter & log to each visit, reminded to bring medications to each visit and discussed diet Educational resources provided:   Self management tools provided: copy of home glucose meter download Other plans: F/u in 3 mo

## 2013-07-12 NOTE — Assessment & Plan Note (Addendum)
EF 40-45% w/ grade 2 diastolic dysfunction on ECHO 10/2012. BP controlled. His weight is up 10lbs from 1 month ago, and he states that it has been fluctuating. He denies any dietary changes. He is not taking the Metolazone but does take the Torsemide. I called the [facility] Failure Clinic to let them know his weight is up 10lbs from May 18th and per Junie Bame with the HF team, the pt is to begin taking the Metolazone 2.5mg  today and tomorrow and take an extra 41mEq of KCl tomorrow. He needs to continue to weigh himself daily. He has an appt with them on 6/23, and they would like him to keep the appt for that day unless he needs to be seen sooner.

## 2013-07-12 NOTE — Progress Notes (Signed)
Case discussed with Dr. Glenn soon after the resident saw the patient.  We reviewed the resident's history and exam and pertinent patient test results.  I agree with the assessment, diagnosis, and plan of care documented in the resident's note. 

## 2013-07-12 NOTE — Progress Notes (Signed)
Patient ID: [name] [name], male   DOB: [birth year], 58 y.o.   MRN: [id number]  Subjective:   Patient ID: [name] [name] male   DOB: 1955/09/12 58 y.o.   MRN: [id number]  HPI: [name] is a 58 y.o. M w/ chronic pain presents c/o back pain.   Per medical chart review, [name] has been having uncontrolled back pain and is scheduled for an injection of his back on 6/18. He called the clinic in May requesting a refill on his pain meds. Per chart review, he was being seen at a pain clinic but tested positive for cocaine and was told that they would not refill his pain medication.   MRI from 11/2012 with Unremarkable appearing L3 through L5 fusion. Presence or absence of pseudarthrosis is not established however. 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  R>L. Findings not present in 2012.  He presents today c/o increased back pain. He states that he is having trouble ambulating due to the pain. He states that he is scheduled to have an injection in his back tomorrow by the pain management specialist. He states that the pain is improved with sitting or lying down or leaning forward. Narcotics also help the pain. He is currently taking OTC pain meds, including Aleve w/o much improvement in his pain. He has tried Flexeril w/o much improvement in his pain. He has not followed up with his Orthopedist.   Pt has a h/o heart failure with EF 40-45% w/ grade 2 diastolic dysfunction on ECHO 10/2012. Today, his weight is up about 10lbs. He states that it fluctuates significantly daily. He is avoiding added salt, denies dietary indiscretions, and is taking his torsemide as prescribed. He is not taking the metolazone. His next appt with Cardiology is 6/23.   He denies fevers, chills, chest pain, abd pain, N/V/D, changes in bladder or bowel function or  incontinence, or numbness in his extremities.    Past Medical History  Diagnosis Date  . GERD (gastroesophageal reflux disease)   . Schizophrenia, schizo-affective   . Cataract   . Hyperlipidemia   . Wears glasses   . Wears dentures     upper  . Hypertension     uncontrolled with medication noncompliance  . Hepatitis C   . HIT (heparin-induced thrombocytopenia)   . Continuous chronic alcoholism   . H/O cocaine abuse     none in 6 months  . Stroke 01/2012    Small cerebellar infarcts right greater than left as well as questionable acute left external capsule and caudate nuclear punctate lacunar infarcts noted per MRI (01/2012) - presumed to be embolic likely source PFO with right to left shunt (noted per TEE 01/ 2014)  . Chronic combined systolic and diastolic CHF (congestive heart failure) 02/29/2012    LV EF 40-45% per 2D echo (02/2012) with grade 1 diastolic dysfunction. 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 Degenerative lumbar spinal stenosis     s/p L2-3, L3-4, L4-5 laminectomy partial facetectomy, and bilateral foraminotomy  . History of pancreatitis 01/2011    Admission for acute pancreatitis presumed 2/2 ongoing alcohol abuse- and hypertriglyceridemia  . PFO (patent foramen ovale) 01/2012    with right to left shunt, noted per TEE in evaluation for source of embolic stroke in 06/8030  . CKD (chronic kidney  disease) stage 2, GFR 60-89 ml/min     BL SCr approximately 1-1.3  . 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 Hepatic steatosis     suspected 2/2 alcohol abuse  . Splenic cyst   . Colitis 05/2009    History of colitis of ascending colon noted on CT abd/pelvis (05/2009), with interval resolution with subsequent CT  . Hypertriglyceridemia   . Cancer     Prostate cancer-bx. 3 weeks ago  . History of ETOH abuse 12-13-12     no use in 6 months    Current Outpatient Prescriptions  Medication Sig Dispense Refill  . ACCU-CHEK FASTCLIX LANCETS MISC Use as instructed to test four times a day. DX: 250.92, insulin requiring  204 each  2  . ARIPiprazole (ABILIFY) 10 MG tablet Take 10 mg by mouth at bedtime.      . Blood Glucose Monitoring Suppl (ACCU-CHEK NANO SMARTVIEW) W/DEVICE KIT Use as instructed to test four times a day. DX: 250.92, insulin requiring  1 kit  0  . carvedilol (COREG) 12.5 MG tablet Take 18.75 mg by mouth 2 (two) times daily with a meal.      . cloNIDine (CATAPRES) 0.1 MG tablet Take 0.1 mg by mouth 2 (two) times daily.      . cyclobenzaprine (FEXMID) 7.5 MG tablet Take 1 tablet (7.5 mg total) by mouth 3 (three) times daily as needed for muscle spasms.  30 tablet  0  . glucose blood (ACCU-CHEK SMARTVIEW) test strip Use as instructed to test four times a day. DX: 250.92, insulin requiring  150 each  2  . insulin aspart (NOVOLOG) 100 UNIT/ML injection Inject 12 Units into the skin 3 (three) times daily with meals.  10 mL  11  . Insulin Detemir (LEVEMIR FLEXTOUCH) 100 UNIT/ML Pen Inject 55 Units into the skin daily at 10 pm.  15 mL  11  . Insulin Pen Needle (FIFTY50 PEN NEEDLES) 31G X 5 MM MISC Use to inject insulin up to 4 times daily. Insulin dependent. diag code 250.00  150 each  5  . lisinopril (PRINIVIL,ZESTRIL) 20 MG tablet Take 1 tablet (20 mg total) by mouth 2 (two) times daily.  60 tablet  3  . living well with diabetes book MISC 1 each by Does not apply route once.  1 each  0  . metolazone (ZAROXOLYN) 2.5 MG tablet Take 2.5 mg by mouth as needed.      Marland Kitchen oxyCODONE-acetaminophen (PERCOCET) 7.5-325 MG per tablet Take 1 tablet by mouth 3 (three) times daily.      Marland Kitchen oxymorphone (OPANA ER) 10 MG 12 hr tablet Take 10 mg by mouth at bedtime.       . pantoprazole (PROTONIX) 40 MG tablet Take 40 mg by mouth 2 (two) times daily.       . potassium chloride (K-DUR) 10 MEQ tablet Take 1 tablet (10 mEq total) by mouth 2 (two) times  daily. Take extra 2 tabs when take Metolazone  70 tablet  3  . rosuvastatin (CRESTOR) 20 MG tablet Take 20 mg by mouth daily.      Marland Kitchen torsemide (DEMADEX) 20 MG tablet Take 2 tablets (40 mg total) by mouth 2 (two) times daily.  120 tablet  6  . traZODone (DESYREL) 100 MG tablet Take 100 mg by mouth at bedtime.       No current facility-administered medications for this visit.   Family History  Problem Relation  Age of Onset  . CAD Mother 45  . CAD Sister   . CAD Brother 80    died from MI at age 29yo  . Hypertension     History   Social History  . Marital Status: Single    Spouse Name: N/A    Number of Children: N/A  . Years of Education: 12th grade   Occupational History  . Disability     2/2 schizophrenia   Social History Main Topics  . Smoking status: Former Smoker -- 0.50 packs/day for 30 years    Types: Cigarettes    Quit date: 06/24/2001  . Smokeless tobacco: None  . Alcohol Use: No     Comment: Quit about several months ago.  . Drug Use: No     Comment: history of cocaine abuse- none in 6 months(reminded not to use)  . Sexual Activity: None   Other Topics Concern  . None   Social History Narrative   Lives in [place] alone, has a [employer] aide that helps with medications 4-5 days a week with medications, helping to clean.   Review of Systems: A 12 point ROS was performed; pertinent positives and negatives were noted in the HPI   Objective:  Physical Exam: Filed Vitals:   07/12/13 1351  BP: 124/79  Pulse: 61  Temp: 98.5 F (36.9 C)  TempSrc: Oral  Resp: 18  Height: 6' (1.829 m)  Weight: 286 lb 14.4 oz (130.137 kg)  SpO2: 98%   Constitutional: Vital signs reviewed.  Patient is a well-developed and well-nourished male who appears to be in pain.  Head: Normocephalic and atraumatic Eyes: PERRL, EOMI Cardiovascular: RRR, no MRG. Trace to 1+ pitting edema of RUQ, possible trace edema of the LLE. Pulmonary/Chest: Normal respiratory effort, CTAB, no wheezes or  crackles. Abdominal: Soft. Non-tender, non-distended Musculoskeletal: Stiffness upon standing, with decreased ROM of the lumbar spine, forward flexion at 70 degrees, extension at 25 degrees. No tenderness to palpation of the spine. Sitting straight leg raise 90 degrees on the left and 80-85 degrees on the right. Well healed surgical scar at lumbar spine. Neurological: A&O x3, cranial nerve II-XII are grossly intact, no focal motor deficit Skin: Warm, dry and intact.  Psychiatric: Mood and affect stable  Assessment & Plan:   Please refer to Problem List based Assessment and Plan

## 2013-07-12 NOTE — Telephone Encounter (Signed)
Pt notified that Dr. Eulas Post spoke to Heart Failure clinic and wanted him to take 1 Metolazon pill today, 1 Metolazon pill tomorrow and 1 extra potassium pill tomorrow. Pt repeated back these exact instructions and stated he understood the plan to help reduce his extra fluid. Pt also acknowledged knowing he has an appt at the Heart Failure clinic on 07/18/13, to which he stated "I will be there." Yvonna Alanis, RN, 07/12/13, 4:18 PM

## 2013-07-12 NOTE — Patient Instructions (Addendum)
**  Begin taking the Metolazone 1 pill today and again tomorrow, and also take an extra potassium pill tomorrow.  **Be sure keep your appointment with the Heart Failure Clinic on 07/18/13.    **Try to avoid ibuprofen and Aleve due to your history of bleeding stomach ulcers.  **For you pain, take the cyclobenzaprine and be sure to go to your appointment with the pain clinic for your injection tomorrow.  **Also, please call your Orthopedist to make an appointment to be seen for your back pain.

## 2013-07-12 NOTE — Assessment & Plan Note (Addendum)
Pt with persistent back pain that has worsened off the narcotics, which were not refilled by the pain specialist 2/2 UDS +cocaine. He has an appt tomorrow (6/18) at the pain clinic for an injection in his back. He is to keep this appt, but I also recommended restarting the flexeril tonight for his pain and trying to avoid Aleve and ibuprofen given his h/o bleeding ulcers. I recommended that he call his orthopedist for a f/u appt for as soon as possible as he may need further intervention if the spinal injection does not help.

## 2013-07-12 NOTE — Assessment & Plan Note (Addendum)
BP Readings from Last 3 Encounters:  07/12/13 124/79  06/12/13 156/94  05/31/13 144/83    Lab Results  Component Value Date   NA 142 06/12/2013   K 4.3 06/12/2013   CREATININE 1.06 06/12/2013    Assessment: Blood pressure control: controlled Progress toward BP goal:  at goal   Plan: Medications:  continue current medications

## 2013-07-13 ENCOUNTER — Ambulatory Visit: Payer: PRIVATE HEALTH INSURANCE | Admitting: Internal Medicine

## 2013-07-13 ENCOUNTER — Other Ambulatory Visit (HOSPITAL_COMMUNITY): Payer: Self-pay

## 2013-07-13 ENCOUNTER — Encounter: Payer: PRIVATE HEALTH INSURANCE | Admitting: Dietician

## 2013-07-13 MED ORDER — CLONIDINE HCL 0.1 MG PO TABS: 0.1000 mg | ORAL_TABLET | Freq: Two times a day (BID) | ORAL | Status: DC

## 2013-07-18 ENCOUNTER — Encounter (HOSPITAL_COMMUNITY): Payer: Self-pay

## 2013-07-18 ENCOUNTER — Inpatient Hospital Stay (HOSPITAL_COMMUNITY): Admission: RE | Admit: 2013-07-18 | Payer: PRIVATE HEALTH INSURANCE | Source: Ambulatory Visit

## 2013-07-24 ENCOUNTER — Other Ambulatory Visit: Payer: Self-pay | Admitting: *Deleted

## 2013-07-24 MED ORDER — PANTOPRAZOLE SODIUM 40 MG PO TBEC: 40.0000 mg | DELAYED_RELEASE_TABLET | Freq: Two times a day (BID) | ORAL | Status: DC

## 2013-07-30 NOTE — Progress Notes (Signed)
Patient ID: [name] [name], male   DOB: December 14, 1955, 58 y.o.   MRN: [id number]  Primary Care: Internal Medicine clinic, Dr. Lysbeth [name]  Primary Cardiologist: N/A GI: Dr [name] [name].   HPI: [name] [name] is a 55 y.o. 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   Former cocaine abuse. Had history of acute renal failure in setting of rhabdo requiring short term HD.    Admitted with new onset HF in April 2014.   He was diuresed and discharged home.  Discharge weight 256 pounds. Echo at the time showed EF 10-31%, grade 1 diastolic dysfunction.    Admitted to [facility] 5/30 through 06/27/12 with GI bleed. EGD 06/25/12 multiple ulcers noted. Continued on protonix.    Admitted 04/2013: Admitted for DKA and was UDS + for cocaine. Started on insulin.   He returns for follow up. Last visit lisinopril was increased to 20 mg twice a day. Overall feels good.  Denies SOB/PND/Orthopnea. Ongoing leg pain. He denies cocaine use (last used in April 2015). Weight at home 271-275 pounds. Taking all medications. Able to walk 2 1/2 blocks. Does take breaks if there are several steps. Following low salt diet and  limits fluid intake to < 2 litets.    Cath 04/27/12 [facility] RA = 4  RV = 45/3/4  PA = 42/9 (24)  PCW = 14  Fick cardiac output/index = 5.2/2.2  PVR = 1.9 [place]  SVR = 1656  FA sat = 96%  PA sat = 62%, 64% Ao Pressure: 149/84 (11)  LV Pressure: 142/8/29  There was no signficant gradient across the aortic valve on pullback. Arteries normal. LV gram EF= 35-40% global HK  ECHO 10/27/12 EF 40-45%   Labs 02/12/13 K 4.3 Creatinine 0.98          05/17/13 K 4.7, creatinine 1.13           06/12/13 K 4.3 Creatinine 1.06            07/12/13 Hgb A1C               SH: Quit smoking 12 years ago.. No alcohol for about a year. Lives alone, Disabled.  FH: Mom died from MI in her early 51s  Brother MI   Review of Systems: All pertinent positives and  negatives as in HPI, otherwise negative.    Past Medical History  Diagnosis Date  . GERD (gastroesophageal reflux disease)   . Schizophrenia, schizo-affective   . Cataract   . Hyperlipidemia   . Wears glasses   . Wears dentures     upper  . Hypertension     uncontrolled with medication noncompliance  . Hepatitis C   . HIT (heparin-induced thrombocytopenia)   . Continuous chronic alcoholism   . H/O cocaine abuse     none in 6 months  . Stroke 01/2012    Small cerebellar infarcts right greater than left as well as questionable acute left external capsule and caudate nuclear punctate lacunar infarcts noted per MRI (01/2012) - presumed to be embolic likely source PFO with right to left shunt (noted per TEE 01/ 2014)  . Chronic combined systolic and diastolic CHF (congestive heart failure) 02/29/2012    LV EF 40-45% per 2D echo (02/2012) with grade 1 diastolic dysfunction. 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 Degenerative lumbar spinal stenosis  s/p L2-3, L3-4, L4-5 laminectomy partial facetectomy, and bilateral foraminotomy  . History of pancreatitis 01/2011    Admission for acute pancreatitis presumed 2/2 ongoing alcohol abuse- and hypertriglyceridemia  . PFO (patent foramen ovale) 01/2012    with right to left shunt, noted per TEE in evaluation for source of embolic stroke in 09/4074  . CKD (chronic kidney disease) stage 2, GFR 60-89 ml/min     BL SCr approximately 1-1.3  . 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 Hepatic steatosis     suspected 2/2 alcohol abuse  . Splenic cyst   . Colitis 05/2009    History of colitis of ascending colon noted on CT abd/pelvis (05/2009), with interval resolution with subsequent CT  . Hypertriglyceridemia   . Cancer     Prostate cancer-bx. 3 weeks ago  . History of ETOH abuse 12-13-12     no use in 6 months     Current Outpatient Prescriptions  Medication Sig Dispense Refill  . ACCU-CHEK FASTCLIX LANCETS MISC Use as instructed to test four times a day. DX: 250.92, insulin requiring  204 each  2  . ARIPiprazole (ABILIFY) 10 MG tablet Take 10 mg by mouth at bedtime.      . Blood Glucose Monitoring Suppl (ACCU-CHEK NANO SMARTVIEW) W/DEVICE KIT Use as instructed to test four times a day. DX: 250.92, insulin requiring  1 kit  0  . carvedilol (COREG) 12.5 MG tablet Take 18.75 mg by mouth 2 (two) times daily with a meal.      . cloNIDine (CATAPRES) 0.1 MG tablet Take 1 tablet (0.1 mg total) by mouth 2 (two) times daily.  60 tablet  3  . cyclobenzaprine (FEXMID) 7.5 MG tablet Take 1 tablet (7.5 mg total) by mouth 3 (three) times daily as needed for muscle spasms.  30 tablet  0  . glucose blood (ACCU-CHEK SMARTVIEW) test strip Use as instructed to test four times a day. DX: 250.92, insulin requiring  150 each  2  . insulin aspart (NOVOLOG) 100 UNIT/ML injection Inject 12 Units into the skin 3 (three) times daily with meals.  10 mL  11  . Insulin Detemir (LEVEMIR FLEXTOUCH) 100 UNIT/ML Pen Inject 55 Units into the skin daily at 10 pm.  15 mL  11  . Insulin Pen Needle (FIFTY50 PEN NEEDLES) 31G X 5 MM MISC Use to inject insulin up to 4 times daily. Insulin dependent. diag code 250.00  150 each  5  . lisinopril (PRINIVIL,ZESTRIL) 20 MG tablet Take 1 tablet (20 mg total) by mouth 2 (two) times daily.  60 tablet  3  . living well with diabetes book MISC 1 each by Does not apply route once.  1 each  0  . metolazone (ZAROXOLYN) 2.5 MG tablet Take 2.5 mg by mouth as needed.      . pantoprazole (PROTONIX) 40 MG tablet Take 1 tablet (40 mg total) by mouth 2 (two) times daily.  60 tablet  2  . potassium chloride (K-DUR) 10 MEQ tablet Take 1 tablet (10 mEq total) by mouth 2 (two) times daily. Take extra 2 tabs when take Metolazone  70 tablet  3  . rosuvastatin (CRESTOR) 20 MG tablet Take 20 mg by mouth daily.      Marland Kitchen  torsemide (DEMADEX) 20 MG tablet Take 2 tablets (40 mg total) by mouth 2 (two) times daily.  120 tablet  6  . traZODone (DESYREL) 100 MG tablet  Take 100 mg by mouth at bedtime.       No current facility-administered medications for this encounter.    Allergies  Allergen Reactions  . Heparin Other (See Comments)    Documented HIT under problem list Pt states he's not allergic.  Marland Kitchen Thorazine [Chlorpromazine Hcl] Other (See Comments)    Body freezes up     Filed Vitals:   08/01/13 0846  BP: 135/86  Pulse: 67  Resp: 18  Weight: 276 lb 8 oz (125.42 kg)  SpO2: 97%   PHYSICAL EXAM: General:  NAD. No respiratory difficulty; Ambulated in the clinc without difficulty  HEENT: normal Neck: supple. JVP difficult to see due to body habitus, but does not appear elelvated. Carotids 2+ bilat; no bruits. No lymphadenopathy or thryomegaly appreciated. Cor: PMI nondisplaced. Regular rate & rhythm. No rubs, gallops, Very soft TR murmur. Lungs: clear Abdomen: soft, nontender, no distention. No hepatosplenomegaly. No bruits or masses. Good bowel sounds. Extremities: no cyanosis, clubbing, rash, no edema Neuro: alert & oriented x 3, cranial nerves grossly intact. moves all 4 extremities w/o difficulty. Flat Affect   ASSESSMENT /PLAN  1) Chronic systolic/diastolic HF, NICM, EF 74-12% (10/2012) - NYHA II-symptoms and volume status stable. Will continue torsemide 40 mg BID and metolazone 2.5 mg as needed. - -Increase carvedilol to 25 mg twice a day.  - Continue  lisinnopril to 20 mg BID. Check BMET today   - Would like to eventually transition off clonidine and use spiro if possible.  - Reinforced the need and importance of daily weights, a low sodium diet, and fluid restriction (less than 2 L a day). Instructed to call the HF clinic if weight increases more than 3 lbs overnight or 5 lbs in a week.   2) HTN - As above will increase carvedilol. Continue lisinopril and clonidine for now. May be able to  stop clonidine and start spironolactone if renal function ok at next visit.  3) Cocaine use- Has not used in 3 months . Congratulated.  -   Follow up in 6 -8 weeks.  Deyci Gesell NP-C  8:53 AM

## 2013-08-01 ENCOUNTER — Ambulatory Visit (HOSPITAL_COMMUNITY)
Admission: RE | Admit: 2013-08-01 | Discharge: 2013-08-01 | Disposition: A | Discharge status: Home / Self Care | Payer: PRIVATE HEALTH INSURANCE | Source: Ambulatory Visit | Attending: Internal Medicine | Admitting: Internal Medicine

## 2013-08-01 ENCOUNTER — Encounter (HOSPITAL_COMMUNITY): Payer: Self-pay

## 2013-08-01 VITALS — BP 135/86 | HR 67 | Resp 18 | Wt 276.5 lb

## 2013-08-01 DIAGNOSIS — Z8546 Personal history of malignant neoplasm of prostate: Secondary | ICD-10-CM | POA: Insufficient documentation

## 2013-08-01 DIAGNOSIS — I509 Heart failure, unspecified: Secondary | ICD-10-CM | POA: Insufficient documentation

## 2013-08-01 DIAGNOSIS — K219 Gastro-esophageal reflux disease without esophagitis: Secondary | ICD-10-CM | POA: Insufficient documentation

## 2013-08-01 DIAGNOSIS — E785 Hyperlipidemia, unspecified: Secondary | ICD-10-CM | POA: Insufficient documentation

## 2013-08-01 DIAGNOSIS — F1411 Cocaine abuse, in remission: Secondary | ICD-10-CM | POA: Insufficient documentation

## 2013-08-01 DIAGNOSIS — Z87891 Personal history of nicotine dependence: Secondary | ICD-10-CM | POA: Insufficient documentation

## 2013-08-01 DIAGNOSIS — Z8249 Family history of ischemic heart disease and other diseases of the circulatory system: Secondary | ICD-10-CM | POA: Insufficient documentation

## 2013-08-01 DIAGNOSIS — I10 Essential (primary) hypertension: Secondary | ICD-10-CM

## 2013-08-01 DIAGNOSIS — I5042 Chronic combined systolic (congestive) and diastolic (congestive) heart failure: Secondary | ICD-10-CM | POA: Insufficient documentation

## 2013-08-01 DIAGNOSIS — I129 Hypertensive chronic kidney disease with stage 1 through stage 4 chronic kidney disease, or unspecified chronic kidney disease: Secondary | ICD-10-CM | POA: Insufficient documentation

## 2013-08-01 DIAGNOSIS — F1410 Cocaine abuse, uncomplicated: Secondary | ICD-10-CM | POA: Insufficient documentation

## 2013-08-01 DIAGNOSIS — N182 Chronic kidney disease, stage 2 (mild): Secondary | ICD-10-CM | POA: Insufficient documentation

## 2013-08-01 DIAGNOSIS — B182 Chronic viral hepatitis C: Secondary | ICD-10-CM | POA: Insufficient documentation

## 2013-08-01 LAB — BASIC METABOLIC PANEL
ANION GAP: 14 (ref 5–15)
BUN: 21 mg/dL (ref 6–23)
CALCIUM: 9.4 mg/dL (ref 8.4–10.5)
CO2: 23 mEq/L (ref 19–32)
CREATININE: 1.11 mg/dL (ref 0.50–1.35)
Chloride: 105 mEq/L (ref 96–112)
GFR calc Af Amer: 82 mL/min — ABNORMAL LOW (ref >90)
GFR, EST NON AFRICAN AMERICAN: 71 mL/min — AB (ref >90)
Glucose, Bld: 124 mg/dL — ABNORMAL HIGH (ref 70–99)
Potassium: 4.5 mEq/L (ref 3.7–5.3)
SODIUM: 142 meq/L (ref 137–147)

## 2013-08-01 MED ORDER — CARVEDILOL 25 MG PO TABS: 25.0000 mg | ORAL_TABLET | Freq: Two times a day (BID) | ORAL | Status: DC

## 2013-08-01 NOTE — Patient Instructions (Signed)
Follow up in 6 weeks  Take carvedilol 25 mg twice a day  Do the following things EVERYDAY: 1) Weigh yourself in the morning before breakfast. Write it down and keep it in a log. 2) Take your medicines as prescribed 3) Eat low salt foods-Limit salt (sodium) to 2000 mg per day.  4) Stay as active as you can everyday 5) Limit all fluids for the day to less than 2 liters

## 2013-08-04 ENCOUNTER — Ambulatory Visit (INDEPENDENT_AMBULATORY_CARE_PROVIDER_SITE_OTHER): Payer: PRIVATE HEALTH INSURANCE | Admitting: Internal Medicine

## 2013-08-04 ENCOUNTER — Encounter: Payer: Self-pay | Admitting: Internal Medicine

## 2013-08-04 VITALS — BP 153/87 | HR 61 | Temp 99.2°F | Ht 72.0 in | Wt 279.5 lb

## 2013-08-04 DIAGNOSIS — I509 Heart failure, unspecified: Secondary | ICD-10-CM

## 2013-08-04 DIAGNOSIS — I5042 Chronic combined systolic (congestive) and diastolic (congestive) heart failure: Secondary | ICD-10-CM

## 2013-08-04 DIAGNOSIS — R5383 Other fatigue: Secondary | ICD-10-CM

## 2013-08-04 DIAGNOSIS — R531 Weakness: Secondary | ICD-10-CM | POA: Insufficient documentation

## 2013-08-04 DIAGNOSIS — R5381 Other malaise: Secondary | ICD-10-CM

## 2013-08-04 LAB — CBC WITH DIFFERENTIAL/PLATELET
BASOS PCT: 0 % (ref 0–1)
Basophils Absolute: 0 10*3/uL (ref 0.0–0.1)
Eosinophils Absolute: 0.2 10*3/uL (ref 0.0–0.7)
Eosinophils Relative: 2 % (ref 0–5)
HEMATOCRIT: 46 % (ref 39.0–52.0)
Hemoglobin: 15.2 g/dL (ref 13.0–17.0)
LYMPHS ABS: 3 10*3/uL (ref 0.7–4.0)
Lymphocytes Relative: 31 % (ref 12–46)
MCH: 29.6 pg (ref 26.0–34.0)
MCHC: 33 g/dL (ref 30.0–36.0)
MCV: 89.7 fL (ref 78.0–100.0)
MONO ABS: 0.7 10*3/uL (ref 0.1–1.0)
Monocytes Relative: 7 % (ref 3–12)
NEUTROS ABS: 5.8 10*3/uL (ref 1.7–7.7)
Neutrophils Relative %: 60 % (ref 43–77)
Platelets: 188 10*3/uL (ref 150–400)
RBC: 5.13 MIL/uL (ref 4.22–5.81)
RDW: 14.5 % (ref 11.5–15.5)
WBC: 9.6 10*3/uL (ref 4.0–10.5)

## 2013-08-04 LAB — COMPLETE METABOLIC PANEL WITH GFR
ALK PHOS: 105 U/L (ref 39–117)
ALT: 49 U/L (ref 0–53)
AST: 42 U/L — ABNORMAL HIGH (ref 0–37)
Albumin: 4.2 g/dL (ref 3.5–5.2)
BILIRUBIN TOTAL: 0.3 mg/dL (ref 0.2–1.2)
BUN: 20 mg/dL (ref 6–23)
CO2: 25 mEq/L (ref 19–32)
CREATININE: 1.34 mg/dL (ref 0.50–1.35)
Calcium: 10.2 mg/dL (ref 8.4–10.5)
Chloride: 101 mEq/L (ref 96–112)
GFR, Est African American: 67 mL/min
GFR, Est Non African American: 58 mL/min — ABNORMAL LOW
Glucose, Bld: 154 mg/dL — ABNORMAL HIGH (ref 70–99)
Potassium: 5 mEq/L (ref 3.5–5.3)
SODIUM: 140 meq/L (ref 135–145)
TOTAL PROTEIN: 8 g/dL (ref 6.0–8.3)

## 2013-08-04 NOTE — Progress Notes (Signed)
   Subjective:    Patient ID: [name] [name], male    DOB: [birth year], 58 y.o.   MRN: [id number]  HPI Mr. Massoth is a 58 year old male with history of chronic pain, DM2, HTN, CHF who presents to clinic today for a 3-day history of decreased energy.  In general, he feels as though he has no energy but does not feel weak. He has difficulty moving around, getting up, sitting still all throughout the day. He does not feel anything has changed in regards to his diet, adherence to medications, or living situation. Feels like this all day along and does note light-headedness. Denies any illicit drug use; last alcohol drink was 1 year ago, cocaine 4 months ago. His electrolytes from 7/7 (three days prior) were unremarkable.    Review of Systems  Constitutional: Positive for activity change and fatigue. Negative for fever.  Respiratory: Negative for shortness of breath.   Cardiovascular: Negative for chest pain, palpitations and leg swelling.  Gastrointestinal: Negative for nausea, vomiting, abdominal pain, diarrhea, constipation and blood in stool.  Endocrine: Negative for polyuria.  Skin: Negative for pallor.  Neurological: Positive for light-headedness. Negative for syncope and weakness.       Objective:   Physical Exam  Constitutional: He is oriented to person, place, and time. He appears well-developed. No distress.  HENT:  Head: Normocephalic and atraumatic.  Eyes: Conjunctivae are normal. Pupils are equal, round, and reactive to light.  Neck: No JVD present. No tracheal deviation present. No thyromegaly present.  Cardiovascular: Normal rate, regular rhythm and normal heart sounds.   Pulmonary/Chest: Effort normal. No respiratory distress.  Abdominal: Soft. Bowel sounds are normal. There is no tenderness. There is no rebound.  Neurological: He is alert and oriented to person, place, and time.  Skin: Skin is warm. No rash noted. He is not diaphoretic. No pallor.            Assessment & Plan:

## 2013-08-04 NOTE — Patient Instructions (Addendum)
Your blood work today looked okay and doesn't give [place] a reason for your symptoms.   Please continue taking your medicine and regular diet this weekend. Please return early next week (Monday/Tuesday) to repeat bloodwork. If feel better, feel free to cancel the appointment.   Thank you!

## 2013-08-04 NOTE — Assessment & Plan Note (Addendum)
-  CBC & CMET grossly unremarkable -Will have patient f/u on 7/14 for repeat bloodwork but will cancer appointment should symptoms improve

## 2013-08-11 NOTE — Progress Notes (Signed)
I saw and evaluated the patient.  I personally confirmed the key portions of the history and exam documented by Dr. Patel and I reviewed pertinent patient test results.  The assessment, diagnosis, and plan were formulated together and I agree with the documentation in the resident's note. 

## 2013-08-11 NOTE — Addendum Note (Signed)
Addended by: Gilles Chiquito B on: 08/11/2013 01:25 PM   Modules accepted: Level of Service

## 2013-08-15 ENCOUNTER — Telehealth: Payer: Self-pay | Admitting: *Deleted

## 2013-08-15 NOTE — Telephone Encounter (Signed)
I attempted to f/u with patient several days after his visit but didn't get a response. I can't see him tomorrow but have informed Dr. Gentry Roch of the patient and my thoughts.

## 2013-08-15 NOTE — Telephone Encounter (Signed)
Pt called in today stating he was seen in clinic about a week ago for feeling weak and no energy.  Lab results were fine. ( CBC - CMET ) Pt still does not have any energy , strength and feels weak. He is asking for something for this condition.   Please advise  Pt # [id number]

## 2013-08-16 ENCOUNTER — Encounter: Payer: Self-pay | Admitting: Internal Medicine

## 2013-08-16 ENCOUNTER — Ambulatory Visit (INDEPENDENT_AMBULATORY_CARE_PROVIDER_SITE_OTHER): Payer: PRIVATE HEALTH INSURANCE | Admitting: Internal Medicine

## 2013-08-16 VITALS — BP 203/110 | HR 65 | Temp 97.8°F | Ht 72.0 in | Wt 280.0 lb

## 2013-08-16 DIAGNOSIS — R531 Weakness: Secondary | ICD-10-CM

## 2013-08-16 DIAGNOSIS — R5381 Other malaise: Secondary | ICD-10-CM

## 2013-08-16 DIAGNOSIS — I10 Essential (primary) hypertension: Secondary | ICD-10-CM

## 2013-08-16 DIAGNOSIS — R5383 Other fatigue: Secondary | ICD-10-CM

## 2013-08-16 LAB — BASIC METABOLIC PANEL WITH GFR
BUN: 14 mg/dL (ref 6–23)
CHLORIDE: 104 meq/L (ref 96–112)
CO2: 29 mEq/L (ref 19–32)
Calcium: 10.1 mg/dL (ref 8.4–10.5)
Creat: 0.89 mg/dL (ref 0.50–1.35)
GFR, Est Non African American: >89 mL/min
GLUCOSE: 90 mg/dL (ref 70–99)
Potassium: 4.4 mEq/L (ref 3.5–5.3)
Sodium: 140 mEq/L (ref 135–145)

## 2013-08-16 MED ORDER — INSULIN ASPART 100 UNIT/ML ~~LOC~~ SOLN
8.0000 [IU] | Freq: Three times a day (TID) | SUBCUTANEOUS | Status: DC
Start: 2013-08-16 — End: 2013-08-22

## 2013-08-16 MED ORDER — INSULIN DETEMIR 100 UNIT/ML FLEXPEN: 45.0000 [IU] | PEN_INJECTOR | Freq: Every day | SUBCUTANEOUS | Status: DC

## 2013-08-16 NOTE — Assessment & Plan Note (Addendum)
Suspect secondary to over diuresis leading to intravascular volume depletion tending towards pre-renal azotemia. Patient is clinically dehydrated on exam and comparison of the recent CMP to labs from may 2015 reveal, drop in GFR from 88 to 67, increase in Cr from 1.0 to 1.34, worsening BUN/Cr ratio from 12 to 19, increasing potassium level from 4.3 to 5.0, all favoring toward pre-renal azotemia from intra-vascular volume depletion, although the Cr is still within normal limits.   Plans: Hold Torsemide till 08/21/13. Hold Metolazone till 08/21/13. Increase Carvedilol to 25 mg po BID, as was planned earlier. Continue Lisinopril 20 mg BID for now, pending BMP. Consider stopping it if K, Cr elevated. Consider holding Potassium supplements if K is elevated. Repeat BMP. Follow up on next Monday to see for improvement of symptoms and to consider restarting his Torsemide at a lower dose. Recommended to call the clinic if he notices any SOB or swelling of his legs. Follow up on 08/21/13.

## 2013-08-16 NOTE — Patient Instructions (Addendum)
Hold Torsemide until your next office visit on 08/21/13. Start taking the Carvedilol 25 mg , twice daily. Continue all the other medications as recommended below.

## 2013-08-16 NOTE — Progress Notes (Signed)
Subjective:   Patient ID: [name] [name] male   DOB: July 29, 1955 58 y.o.   MRN: [id number]  HPI: [name] is a 58 y.o. gentleman with PMH significant for HTN, DM-II, HLD, Schizophrenia comes to the office for a follow up of his previous office visit.  Patient was seen in the office on 08/04/13 for generalized weakness. Patient reports that he feels "no energy to do anything" for the last two weeks. He reports that the symptoms are about the same since he saw Dr. Posey Pronto on 08/04/13. Patient denies any chest pain, SOB, swelling of legs, orthopnea, PND, N/V/abdomen pain, fever, chills, headaches, dysuria, cough, muscle aches. He denies any other symptoms.  Patient reports compliance to all his medications except for he states that he is taking Torsemide 20 mg BID and he is still taking Coreg 18.75 mg po BID. He denies any hypoglycemic spells.  He denies any other complaints during this office visit.  Past Medical History  Diagnosis Date  . GERD (gastroesophageal reflux disease)   . Schizophrenia, schizo-affective   . Cataract   . Hyperlipidemia   . Wears glasses   . Wears dentures     upper  . Hypertension     uncontrolled with medication noncompliance  . Hepatitis C   . HIT (heparin-induced thrombocytopenia)   . Continuous chronic alcoholism   . H/O cocaine abuse     none in 6 months  . Stroke 01/2012    Small cerebellar infarcts right greater than left as well as questionable acute left external capsule and caudate nuclear punctate lacunar infarcts noted per MRI (01/2012) - presumed to be embolic likely source PFO with right to left shunt (noted per TEE 01/ 2014)  . Chronic combined systolic and diastolic CHF (congestive heart failure) 02/29/2012    LV EF 40-45% per 2D echo (02/2012) with grade 1 diastolic dysfunction. 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 Degenerative lumbar spinal stenosis     s/p L2-3,  L3-4, L4-5 laminectomy partial facetectomy, and bilateral foraminotomy  . History of pancreatitis 01/2011    Admission for acute pancreatitis presumed 2/2 ongoing alcohol abuse- and hypertriglyceridemia  . PFO (patent foramen ovale) 01/2012    with right to left shunt, noted per TEE in evaluation for source of embolic stroke in 04/6960  . CKD (chronic kidney disease) stage 2, GFR 60-89 ml/min     BL SCr approximately 1-1.3  . 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 Hepatic steatosis     suspected 2/2 alcohol abuse  . Splenic cyst   . Colitis 05/2009    History of colitis of ascending colon noted on CT abd/pelvis (05/2009), with interval resolution with subsequent CT  . Hypertriglyceridemia   . Cancer     Prostate cancer-bx. 3 weeks ago  . History of ETOH abuse 12-13-12     no use in 6 months   Current Outpatient Prescriptions  Medication Sig Dispense Refill  . ARIPiprazole (ABILIFY) 20 MG tablet Take 20 mg by mouth at bedtime.      . carvedilol (COREG) 25 MG tablet Take 1 tablet (25 mg total) by mouth 2 (two) times daily with a meal.  60 tablet  6  . cloNIDine (CATAPRES) 0.1 MG tablet Take 1 tablet (0.1 mg total) by mouth 2 (two) times daily.  60 tablet  3  . insulin aspart (  NOVOLOG) 100 UNIT/ML injection Inject 12 Units into the skin 3 (three) times daily with meals.  10 mL  11  . Insulin Detemir (LEVEMIR FLEXTOUCH) 100 UNIT/ML Pen Inject 55 Units into the skin daily at 10 pm.  15 mL  11  . lisinopril (PRINIVIL,ZESTRIL) 20 MG tablet Take 1 tablet (20 mg total) by mouth 2 (two) times daily.  60 tablet  3  . oxyCODONE (OXY IR/ROXICODONE) 5 MG immediate release tablet Take 5 mg by mouth 2 (two) times daily.      . pantoprazole (PROTONIX) 40 MG tablet Take 1 tablet (40 mg total) by mouth 2 (two) times daily.  60 tablet  2  . potassium chloride (K-DUR) 10 MEQ tablet Take 1 tablet (10 mEq total) by mouth 2 (two) times daily. Take  extra 2 tabs when take Metolazone  70 tablet  3  . rosuvastatin (CRESTOR) 20 MG tablet Take 20 mg by mouth daily.      Marland Kitchen torsemide (DEMADEX) 20 MG tablet Take 2 tablets (40 mg total) by mouth 2 (two) times daily.  120 tablet  6  . traZODone (DESYREL) 100 MG tablet Take 100 mg by mouth at bedtime.      Marland Kitchen ACCU-CHEK FASTCLIX LANCETS MISC Use as instructed to test four times a day. DX: 250.92, insulin requiring  204 each  2  . Blood Glucose Monitoring Suppl (ACCU-CHEK NANO SMARTVIEW) W/DEVICE KIT Use as instructed to test four times a day. DX: 250.92, insulin requiring  1 kit  0  . glucose blood (ACCU-CHEK SMARTVIEW) test strip Use as instructed to test four times a day. DX: 250.92, insulin requiring  150 each  2  . Insulin Pen Needle (FIFTY50 PEN NEEDLES) 31G X 5 MM MISC Use to inject insulin up to 4 times daily. Insulin dependent. diag code 250.00  150 each  5  . living well with diabetes book MISC 1 each by Does not apply route once.  1 each  0  . metolazone (ZAROXOLYN) 2.5 MG tablet Take 2.5 mg by mouth as needed.       No current facility-administered medications for this visit.   Family History  Problem Relation Age of Onset  . CAD Mother 98  . CAD Sister   . CAD Brother 53    died from MI at age 67yo  . Hypertension     History   Social History  . Marital Status: Single    Spouse Name: N/A    Number of Children: N/A  . Years of Education: 12th grade   Occupational History  . Disability     2/2 schizophrenia   Social History Main Topics  . Smoking status: Former Smoker -- 0.50 packs/day for 30 years    Types: Cigarettes    Quit date: 06/24/2001  . Smokeless tobacco: None  . Alcohol Use: No     Comment: Quit about several months ago.  . Drug Use: No     Comment: history of cocaine abuse- none in 6 months(reminded not to use)  . Sexual Activity: None   Other Topics Concern  . None   Social History Narrative   Lives in [place] alone, has a [place] aide that helps with  medications 4-5 days a week with medications, helping to clean.   Review of Systems: Pertinent items are noted in HPI. Objective:  Physical Exam: Filed Vitals:   08/16/13 1354  BP: 203/110  Pulse: 65  Temp: 97.8 F (36.6 C)  TempSrc: Oral  Height: 6' (1.829 m)  Weight: 280 lb (127.007 kg)  SpO2: 98%   Constitutional: Vital signs reviewed.  Patient is a well-developed and well-nourished, in no acute distress and cooperative with exam.  Alert and oriented x3.  Mouth: Dry mucous membranes. Neck: Supple, No carotid bruit present.  Cardiovascular: S1 normal, S2 normal, no MRG, pulses symmetric and intact bilaterally  Pulmonary/Chest: normal respiratory effort, CTAB, no wheezes, rales, or rhonchi  Extremities: No pedal edema. Extremities look very dry. Neurological: A&O x3  Assessment & Plan:

## 2013-08-17 NOTE — Progress Notes (Signed)
INTERNAL MEDICINE TEACHING ATTENDING ADDENDUM - Aldine Contes, MD: I reviewed and discussed at the time of visit with the resident Dr. Eyvonne Mechanic, the patient's medical history, physical examination, diagnosis and results of pertinent tests and treatment and I agree with the patient's care as documented.

## 2013-08-21 ENCOUNTER — Ambulatory Visit: Payer: PRIVATE HEALTH INSURANCE | Admitting: Internal Medicine

## 2013-08-22 ENCOUNTER — Encounter: Payer: Self-pay | Admitting: Internal Medicine

## 2013-08-22 ENCOUNTER — Ambulatory Visit (INDEPENDENT_AMBULATORY_CARE_PROVIDER_SITE_OTHER): Payer: PRIVATE HEALTH INSURANCE | Admitting: Internal Medicine

## 2013-08-22 VITALS — BP 155/84 | HR 60 | Temp 98.4°F | Ht 71.0 in | Wt 288.7 lb

## 2013-08-22 DIAGNOSIS — R5381 Other malaise: Secondary | ICD-10-CM

## 2013-08-22 DIAGNOSIS — R5383 Other fatigue: Secondary | ICD-10-CM

## 2013-08-22 DIAGNOSIS — H9193 Unspecified hearing loss, bilateral: Secondary | ICD-10-CM

## 2013-08-22 DIAGNOSIS — H9190 Unspecified hearing loss, unspecified ear: Secondary | ICD-10-CM

## 2013-08-22 DIAGNOSIS — E119 Type 2 diabetes mellitus without complications: Secondary | ICD-10-CM

## 2013-08-22 DIAGNOSIS — R531 Weakness: Secondary | ICD-10-CM

## 2013-08-22 MED ORDER — INSULIN DETEMIR 100 UNIT/ML FLEXPEN: 40.0000 [IU] | PEN_INJECTOR | Freq: Every day | SUBCUTANEOUS | Status: DC

## 2013-08-22 MED ORDER — INSULIN ASPART 100 UNIT/ML ~~LOC~~ SOLN: 5.0000 [IU] | Freq: Three times a day (TID) | SUBCUTANEOUS | Status: DC

## 2013-08-22 MED ORDER — TORSEMIDE 20 MG PO TABS: 40.0000 mg | ORAL_TABLET | Freq: Every day | ORAL | Status: DC

## 2013-08-22 NOTE — Patient Instructions (Signed)
Start taking Lantus insulin 40 units at bedtime. Start taking Novolog insulin 5 units before breakfast, lunch and dinner. Check your blood sugars 4 times daily for the next one week. Check before breakfast, lunch, dinner and at bedtime. If you notice any blood sugars less than 60, please call the clinic immediately or seek medical help. Start taking Demadex 20 mg, 2 tablets once daily.

## 2013-08-23 LAB — GLUCOSE, CAPILLARY: Glucose-Capillary: 146 mg/dL — ABNORMAL HIGH (ref 70–99)

## 2013-08-23 NOTE — Assessment & Plan Note (Signed)
Unclear etiology. Symptoms improved upon holding his diuretics for 4-5 days but patient still complaining of persistent weakness. Other etiologies that need to be ruled are hypoglycemia, Hypothyroidism, Recreational drug use (patient was cocaine positive in April 2015). Of note, Patient was hospitalized in April 2015 for DKA in the setting of cocaine intake. He was taking Metformin 500 mg po bid prior to the admission with an A1C of 7.4. Patient was discharged home on Lantus 55 units qhs + 10 units Novolog insulin TID and his metformin was discontinued for unclear reasons. The question is whether he requires so much insulin especially when his DM was decently well controlled with Metformin 500 BID. Could his weakness be secondary to undocumented hypoglycemic spells. Discussed with the attending regarding further management.  Plans: Decrease his Lantus insulin to 40 U qhs, Novolog insulin to 5 units TID. Recommended to check his CBG's four times daily. Fasting, before lunch, before dinner and at bedtime. Restarting Torsemide at 40 mg po qd (instead of BID) Follow up in a week. Will check TSH at the next blood draw.

## 2013-08-23 NOTE — Assessment & Plan Note (Signed)
Patient presenting with generalized weakness of about 4 weeks duration. Patient was hospitalized in April 2015 for DKA in the setting of cocaine intake. He was taking Metformin 500 mg po bid prior to the admission with an A1C of 7.4. Patient was discharged home on Lantus 55 units qhs + 10 units Novolog insulin TID and his metformin was discontinued for unclear reasons. The question is whether he requires so much insulin especially when his DM was decently well controlled with Metformin 500 BID. Could his weakness be secondary to undocumented hypoglycemic spells. Discussed with the attending regarding further management.  Plans: Decrease his Lantus insulin to 40 U qhs, Novolog insulin to 5 units TID. Recommended to check his CBG's four times daily. Fasting, before lunch, before dinner and at bedtime. Follow up in a week.

## 2013-08-23 NOTE — Progress Notes (Signed)
Case discussed with Dr. Boggala at the time of the visit.  We reviewed the resident's history and exam and pertinent patient test results.  I agree with the assessment, diagnosis, and plan of care documented in the resident's note. 

## 2013-08-23 NOTE — Assessment & Plan Note (Signed)
Slightly elevated, secondary to holding his diuretics over the week end.  Plans: Resume Torsemide 40 mg po qd. (instead of BID) Continue all the other anti-hypertensives.

## 2013-08-23 NOTE — Progress Notes (Signed)
Subjective:   Patient ID: [name] [name] male   DOB: [birth year] 58 y.o.   MRN: [id number]  HPI: [name] is a 58 y.o. gentleman with PMH significant for HTN, DM-II, HLD, Schizophrenia comes to the office for a follow up of his previous office visit.   Patient was seen in the office on 08/04/13 and 08/16/13 for generalized weakness. I saw [name] on 08/16/13 for these symptoms at which time he appeared clinically dehydrated with a tendency for pre-renal azotemia on the metabolic panel. Patient was recommended to hold off on his diuretic regimen for 4 days and have a follow up. Patient reports that he feels "slightly better" after holding his diuretics but still complaining of weakness, not have the energy to do his regular activities. He brings his glucometer to the office today and do not have any blood sugars less than 100. Most of the blood sugars are between 100 -200. Patient denies any symptoms suggestive of hypoglycemia. Patient denies any chest pain, SOB, swelling of legs, orthopnea, PND, N/V/abdomen pain, fever, chills, headaches, dysuria, cough, muscle aches.   He denies any other symptoms.   Past Medical History  Diagnosis Date  . GERD (gastroesophageal reflux disease)   . Schizophrenia, schizo-affective   . Cataract   . Hyperlipidemia   . Wears glasses   . Wears dentures     upper  . Hypertension     uncontrolled with medication noncompliance  . Hepatitis C   . HIT (heparin-induced thrombocytopenia)   . Continuous chronic alcoholism   . H/O cocaine abuse     none in 6 months  . Stroke 01/2012    Small cerebellar infarcts right greater than left as well as questionable acute left external capsule and caudate nuclear punctate lacunar infarcts noted per MRI (01/2012) - presumed to be embolic likely source PFO with right to left shunt (noted per TEE 01/ 2014)  . Chronic combined systolic and diastolic CHF (congestive heart failure) 02/29/2012    LV EF 40-45% per  2D echo (02/2012) with grade 1 diastolic dysfunction. 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 Degenerative lumbar spinal stenosis     s/p L2-3, L3-4, L4-5 laminectomy partial facetectomy, and bilateral foraminotomy  . History of pancreatitis 01/2011    Admission for acute pancreatitis presumed 2/2 ongoing alcohol abuse- and hypertriglyceridemia  . PFO (patent foramen ovale) 01/2012    with right to left shunt, noted per TEE in evaluation for source of embolic stroke in 06/4156  . CKD (chronic kidney disease) stage 2, GFR 60-89 ml/min     BL SCr approximately 1-1.3  . 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 Hepatic steatosis     suspected 2/2 alcohol abuse  . Splenic cyst   . Colitis 05/2009    History of colitis of ascending colon noted on CT abd/pelvis (05/2009), with interval resolution with subsequent CT  . Hypertriglyceridemia   . Cancer     Prostate cancer-bx. 3 weeks ago  . History of ETOH abuse 12-13-12     no use in 6 months   Current Outpatient Prescriptions  Medication Sig Dispense Refill  . ACCU-CHEK FASTCLIX LANCETS MISC Use as instructed to test four times a day. DX: 250.92, insulin requiring  204 each  2  . ARIPiprazole (ABILIFY) 20 MG tablet Take 20 mg by mouth at bedtime.      Marland Kitchen  Blood Glucose Monitoring Suppl (ACCU-CHEK NANO SMARTVIEW) W/DEVICE KIT Use as instructed to test four times a day. DX: 250.92, insulin requiring  1 kit  0  . carvedilol (COREG) 25 MG tablet Take 1 tablet (25 mg total) by mouth 2 (two) times daily with a meal.  60 tablet  6  . cloNIDine (CATAPRES) 0.1 MG tablet Take 1 tablet (0.1 mg total) by mouth 2 (two) times daily.  60 tablet  3  . glucose blood (ACCU-CHEK SMARTVIEW) test strip Use as instructed to test four times a day. DX: 250.92, insulin requiring  150 each  2  . insulin aspart (NOVOLOG) 100 UNIT/ML injection  Inject 5 Units into the skin 3 (three) times daily with meals.  10 mL  11  . Insulin Detemir (LEVEMIR FLEXTOUCH) 100 UNIT/ML Pen Inject 40 Units into the skin daily at 10 pm.  15 mL  11  . Insulin Pen Needle (FIFTY50 PEN NEEDLES) 31G X 5 MM MISC Use to inject insulin up to 4 times daily. Insulin dependent. diag code 250.00  150 each  5  . lisinopril (PRINIVIL,ZESTRIL) 20 MG tablet Take 1 tablet (20 mg total) by mouth 2 (two) times daily.  60 tablet  3  . living well with diabetes book MISC 1 each by Does not apply route once.  1 each  0  . metolazone (ZAROXOLYN) 2.5 MG tablet Take 2.5 mg by mouth as needed.      Marland Kitchen oxyCODONE (OXY IR/ROXICODONE) 5 MG immediate release tablet Take 5 mg by mouth 2 (two) times daily.      . pantoprazole (PROTONIX) 40 MG tablet Take 1 tablet (40 mg total) by mouth 2 (two) times daily.  60 tablet  2  . potassium chloride (K-DUR) 10 MEQ tablet Take 1 tablet (10 mEq total) by mouth 2 (two) times daily. Take extra 2 tabs when take Metolazone  70 tablet  3  . rosuvastatin (CRESTOR) 20 MG tablet Take 20 mg by mouth daily.      Marland Kitchen torsemide (DEMADEX) 20 MG tablet Take 2 tablets (40 mg total) by mouth daily.  120 tablet  6  . traZODone (DESYREL) 100 MG tablet Take 100 mg by mouth at bedtime.       No current facility-administered medications for this visit.   Family History  Problem Relation Age of Onset  . CAD Mother 78  . CAD Sister   . CAD Brother 23    died from MI at age 71yo  . Hypertension     History   Social History  . Marital Status: Single    Spouse Name: N/A    Number of Children: N/A  . Years of Education: 12th grade   Occupational History  . Disability     2/2 schizophrenia   Social History Main Topics  . Smoking status: Former Smoker -- 0.50 packs/day for 30 years    Types: Cigarettes    Quit date: 06/24/2001  . Smokeless tobacco: None  . Alcohol Use: No     Comment: Quit about several months ago.  . Drug Use: No     Comment: history of  cocaine abuse- none in 6 months(reminded not to use)  . Sexual Activity: None   Other Topics Concern  . None   Social History Narrative   Lives in [place] alone, has a [place] aide that helps with medications 4-5 days a week with medications, helping to clean.   Review of Systems: Pertinent items are noted in HPI.  Objective:  Physical Exam: Filed Vitals:   08/22/13 1058  BP: 155/84  Pulse: 60  Temp: 98.4 F (36.9 C)  TempSrc: Oral  Height: $Remove'5\' 11"'uGvImnn$  (1.803 m)  Weight: 288 lb 11.2 oz (130.953 kg)  SpO2: 96%   Constitutional: Vital signs reviewed.  Patient is a well-developed and well-nourished, in no acute distress and cooperative with exam.  Alert and oriented x3.  Mouth: Moist mucous membranes.  Neck: Supple, No carotid bruit present.  Cardiovascular: S1 normal, S2 normal, no MRG, pulses symmetric and intact bilaterally  Pulmonary/Chest: normal respiratory effort, CTAB, no wheezes, rales, or rhonchi  Extremities: No pedal edema.  Neurological: A&O x3.  Assessment & Plan:

## 2013-08-29 ENCOUNTER — Ambulatory Visit: Payer: PRIVATE HEALTH INSURANCE | Admitting: Internal Medicine

## 2013-08-31 ENCOUNTER — Ambulatory Visit: Payer: PRIVATE HEALTH INSURANCE | Admitting: Internal Medicine

## 2013-09-04 ENCOUNTER — Encounter: Payer: Self-pay | Admitting: Internal Medicine

## 2013-09-04 ENCOUNTER — Ambulatory Visit (INDEPENDENT_AMBULATORY_CARE_PROVIDER_SITE_OTHER): Payer: PRIVATE HEALTH INSURANCE | Admitting: Internal Medicine

## 2013-09-04 VITALS — BP 133/78 | HR 51 | Temp 98.2°F | Ht 71.0 in | Wt 282.0 lb

## 2013-09-04 DIAGNOSIS — I10 Essential (primary) hypertension: Secondary | ICD-10-CM

## 2013-09-04 DIAGNOSIS — R531 Weakness: Secondary | ICD-10-CM

## 2013-09-04 DIAGNOSIS — R5383 Other fatigue: Secondary | ICD-10-CM

## 2013-09-04 DIAGNOSIS — E119 Type 2 diabetes mellitus without complications: Secondary | ICD-10-CM

## 2013-09-04 DIAGNOSIS — R5381 Other malaise: Secondary | ICD-10-CM

## 2013-09-04 LAB — GLUCOSE, CAPILLARY: Glucose-Capillary: 110 mg/dL — ABNORMAL HIGH (ref 70–99)

## 2013-09-04 NOTE — Progress Notes (Signed)
Case discussed with Dr. Gill at the time of the visit.  We reviewed the resident's history and exam and pertinent patient test results.  I agree with the assessment, diagnosis, and plan of care documented in the resident's note. 

## 2013-09-04 NOTE — Patient Instructions (Signed)
Thank you for your visit today.   Please return to the internal medicine clinic in 6 weeks to recheck your blood sugars and hemoglobin A1c.    Your current medical regimen is effective;  continue present plan and take all medications as prescribed.    I have made the following additions/changes to your medications: Continue your current regimen.   Please be sure to bring all of your medications with you to every visit; this includes herbal supplements, vitamins, eye drops, and any over-the-counter medications.   Should you have any questions regarding your medications and/or any new or worsening symptoms, please be sure to call the clinic at [phone].   If you believe that you are suffering from a life threatening condition or one that may result in the loss of limb or function, then you should call 911 or proceed to the nearest Emergency Department.     A healthy lifestyle and preventative care can promote health and wellness.   Maintain regular health, dental, and eye exams.  Eat a healthy diet. Foods like vegetables, fruits, whole grains, low-fat dairy products, and lean protein foods contain the nutrients you need without too many calories. Decrease your intake of foods high in solid fats, added sugars, and salt. Get information about a proper diet from your caregiver, if necessary.  Regular physical exercise is one of the most important things you can do for your health. Most adults should get at least 150 minutes of moderate-intensity exercise (any activity that increases your heart rate and causes you to sweat) each week. In addition, most adults need muscle-strengthening exercises on 2 or more days a week.   Maintain a healthy weight. The body mass index (BMI) is a screening tool to identify possible weight problems. It provides an estimate of body fat based on height and weight. Your caregiver can help determine your BMI, and can help you achieve or maintain a healthy weight.  For adults 20 years and older:  A BMI below 18.5 is considered underweight.  A BMI of 18.5 to 24.9 is normal.  A BMI of 25 to 29.9 is considered overweight.  A BMI of 30 and above is considered obese.

## 2013-09-04 NOTE — Assessment & Plan Note (Signed)
Generalized weakness has resolved.   -continue to monitor cbg's

## 2013-09-04 NOTE — Assessment & Plan Note (Signed)
BP well controlled today 133/78.   -continue torsemide 40mg  -metolazone 2.5mg  as needed -lisinopril 20mg  bid -clonidine 0.1mg  bid  -carvedilol 25mg  bid

## 2013-09-04 NOTE — Assessment & Plan Note (Addendum)
Pt doing well managing his DM, last HA1c 6.9.  Most cbg's are <200--had an isolated 392 which he reports was after he had eaten a sandwich.  I reiterated the importance of checking his blood sugars before meals.  Reports trying to eat more vegetable and less sweets.  Denies any low blood sugars.  -continue levemir at 40 units daily -continue novolog 5 units tid with meals -RTC in 6 weeks to see Dr. Dareen Piano to recheck HA1c

## 2013-09-04 NOTE — Progress Notes (Signed)
Patient ID: [name] [name], male   DOB: [birth year], 58 y.o.   MRN: [id number]    Subjective:   Patient ID: [name] [name] male    DOB: 06/24/1955 58 y.o.    MRN: [id number] Health Maintenance Due: Health Maintenance Due  Topic Date Due  . Influenza Vaccine  08/26/2013    _________________________________________________  HPI: [name] is a 58 y.o. male here for an acute visit.  Pt has a PMH outlined below.  Please see problem-based charting assessment and plan note for further details of medical issues addressed at today's visit.  PMH: Past Medical History  Diagnosis Date  . GERD (gastroesophageal reflux disease)   . Schizophrenia, schizo-affective   . Cataract   . Hyperlipidemia   . Wears glasses   . Wears dentures     upper  . Hypertension     uncontrolled with medication noncompliance  . Hepatitis C   . HIT (heparin-induced thrombocytopenia)   . Continuous chronic alcoholism   . H/O cocaine abuse     none in 6 months  . Stroke 01/2012    Small cerebellar infarcts right greater than left as well as questionable acute left external capsule and caudate nuclear punctate lacunar infarcts noted per MRI (01/2012) - presumed to be embolic likely source PFO with right to left shunt (noted per TEE 01/ 2014)  . Chronic combined systolic and diastolic CHF (congestive heart failure) 02/29/2012    LV EF 40-45% per 2D echo (02/2012) with grade 1 diastolic dysfunction. 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 Degenerative lumbar spinal stenosis     s/p L2-3, L3-4, L4-5 laminectomy partial facetectomy, and bilateral foraminotomy  . History of pancreatitis 01/2011    Admission for acute pancreatitis presumed 2/2 ongoing alcohol abuse- and hypertriglyceridemia  . PFO (patent foramen ovale) 01/2012    with right to left shunt, noted per TEE in evaluation for source of embolic stroke in 08/2498  . CKD (chronic  kidney disease) stage 2, GFR 60-89 ml/min     BL SCr approximately 1-1.3  . 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 Hepatic steatosis     suspected 2/2 alcohol abuse  . Splenic cyst   . Colitis 05/2009    History of colitis of ascending colon noted on CT abd/pelvis (05/2009), with interval resolution with subsequent CT  . Hypertriglyceridemia   . Cancer     Prostate cancer-bx. 3 weeks ago  . History of ETOH abuse 12-13-12     no use in 6 months    Medications: Current Outpatient Prescriptions on File Prior to Visit  Medication Sig Dispense Refill  . ACCU-CHEK FASTCLIX LANCETS MISC Use as instructed to test four times a day. DX: 250.92, insulin requiring  204 each  2  . ARIPiprazole (ABILIFY) 20 MG tablet Take 20 mg by mouth at bedtime.      . Blood Glucose Monitoring Suppl (ACCU-CHEK NANO SMARTVIEW) W/DEVICE KIT Use as instructed to test four times a day. DX: 250.92, insulin requiring  1 kit  0  . carvedilol (COREG) 25 MG tablet Take 1 tablet (25 mg total) by mouth 2 (two) times daily with a meal.  60 tablet  6  . cloNIDine (CATAPRES) 0.1 MG tablet Take 1 tablet (0.1 mg total) by mouth 2 (two) times daily.  60 tablet  3  . glucose blood (ACCU-CHEK SMARTVIEW)  test strip Use as instructed to test four times a day. DX: 250.92, insulin requiring  150 each  2  . insulin aspart (NOVOLOG) 100 UNIT/ML injection Inject 5 Units into the skin 3 (three) times daily with meals.  10 mL  11  . Insulin Detemir (LEVEMIR FLEXTOUCH) 100 UNIT/ML Pen Inject 40 Units into the skin daily at 10 pm.  15 mL  11  . Insulin Pen Needle (FIFTY50 PEN NEEDLES) 31G X 5 MM MISC Use to inject insulin up to 4 times daily. Insulin dependent. diag code 250.00  150 each  5  . lisinopril (PRINIVIL,ZESTRIL) 20 MG tablet Take 1 tablet (20 mg total) by mouth 2 (two) times daily.  60 tablet  3  . living well with diabetes book MISC 1 each by Does not apply route once.   1 each  0  . metolazone (ZAROXOLYN) 2.5 MG tablet Take 2.5 mg by mouth as needed.      Marland Kitchen oxyCODONE (OXY IR/ROXICODONE) 5 MG immediate release tablet Take 5 mg by mouth 2 (two) times daily.      . pantoprazole (PROTONIX) 40 MG tablet Take 1 tablet (40 mg total) by mouth 2 (two) times daily.  60 tablet  2  . potassium chloride (K-DUR) 10 MEQ tablet Take 1 tablet (10 mEq total) by mouth 2 (two) times daily. Take extra 2 tabs when take Metolazone  70 tablet  3  . rosuvastatin (CRESTOR) 20 MG tablet Take 20 mg by mouth daily.      Marland Kitchen torsemide (DEMADEX) 20 MG tablet Take 2 tablets (40 mg total) by mouth daily.  120 tablet  6  . traZODone (DESYREL) 100 MG tablet Take 100 mg by mouth at bedtime.       No current facility-administered medications on file prior to visit.    Allergies: Allergies  Allergen Reactions  . Heparin Other (See Comments)    Documented HIT under problem list Pt states he's not allergic.  Marland Kitchen Thorazine [Chlorpromazine Hcl] Other (See Comments)    Body freezes up    FH: Family History  Problem Relation Age of Onset  . CAD Mother 57  . CAD Sister   . CAD Brother 55    died from MI at age 30yo  . Hypertension      SH: History   Social History  . Marital Status: Single    Spouse Name: N/A    Number of Children: N/A  . Years of Education: 12th grade   Occupational History  . Disability     2/2 schizophrenia   Social History Main Topics  . Smoking status: Former Smoker -- 0.50 packs/day for 30 years    Types: Cigarettes    Quit date: 06/24/2001  . Smokeless tobacco: Not on file  . Alcohol Use: No     Comment: Quit about several months ago.  . Drug Use: No     Comment: history of cocaine abuse- none in 6 months(reminded not to use)  . Sexual Activity: Not on file   Other Topics Concern  . Not on file   Social History Narrative   Lives in [place] alone, has a [facility] aide that helps with medications 4-5 days a week with medications, helping to clean.     Review of Systems: Constitutional: Negative for fever, chills and weight loss.  Eyes: Negative for blurred vision.  Respiratory: Negative for cough and shortness of breath.  Cardiovascular: Negative for chest pain, palpitations and leg swelling.  Gastrointestinal: Negative  for nausea, vomiting, abdominal pain, diarrhea, constipation and blood in stool.  Genitourinary: Negative for dysuria, urgency and frequency.  Musculoskeletal: Negative for myalgias and back pain.  Neurological: Negative for dizziness, weakness and headaches.     Objective:   Vital Signs: There were no vitals filed for this visit.    BP Readings from Last 3 Encounters:  08/22/13 155/84  08/16/13 203/110  08/04/13 153/87    Physical Exam: Constitutional: Vital signs reviewed.  Patient is well-developed and well-nourished in NAD and cooperative with exam.  Head: Normocephalic and atraumatic. Eyes: PERRL, EOMI, conjunctivae nl, no scleral icterus.  Neck: Supple. Cardiovascular: RRR, no MRG. Pulmonary/Chest: normal effort, non-tender to palpation, CTAB, no wheezes, rales, or rhonchi. Abdominal: Soft. NT/ND +BS. Neurological: A&O x3, cranial nerves II-XII are grossly intact, moving all extremities. Extremities: 2+DP b/l; no pitting edema. Skin: Warm, dry and intact. No rash.  Most Recent Laboratory Results:  CMP     Component Value Date/Time   NA 140 08/16/2013 1448   K 4.4 08/16/2013 1448   CL 104 08/16/2013 1448   CO2 29 08/16/2013 1448   GLUCOSE 90 08/16/2013 1448   BUN 14 08/16/2013 1448   CREATININE 0.89 08/16/2013 1448   CREATININE 1.11 08/01/2013 0900   CALCIUM 10.1 08/16/2013 1448   PROT 8.0 08/04/2013 1559   ALBUMIN 4.2 08/04/2013 1559   AST 42* 08/04/2013 1559   ALT 49 08/04/2013 1559   ALKPHOS 105 08/04/2013 1559   BILITOT 0.3 08/04/2013 1559   GFRNONAA >89 08/16/2013 1448   GFRNONAA 71* 08/01/2013 0900   GFRAA >89 08/16/2013 1448   GFRAA 82* 08/01/2013 0900    CBC    Component Value Date/Time    WBC 9.6 08/04/2013 1559   RBC 5.13 08/04/2013 1559   HGB 15.2 08/04/2013 1559   HCT 46.0 08/04/2013 1559   PLT 188 08/04/2013 1559   MCV 89.7 08/04/2013 1559   MCH 29.6 08/04/2013 1559   MCHC 33.0 08/04/2013 1559   RDW 14.5 08/04/2013 1559   LYMPHSABS 3.0 08/04/2013 1559   MONOABS 0.7 08/04/2013 1559   EOSABS 0.2 08/04/2013 1559   BASOSABS 0.0 08/04/2013 1559    Lipid Panel Lab Results  Component Value Date   CHOL 117 04/03/2013   HDL 32* 04/03/2013   LDLCALC 46 04/03/2013   TRIG 196* 04/03/2013   CHOLHDL 3.7 04/03/2013    HA1C Lab Results  Component Value Date   HGBA1C 6.9 07/12/2013    Urinalysis    Component Value Date/Time   COLORURINE YELLOW 05/09/2013 1704   APPEARANCEUR CLEAR 05/09/2013 1704   LABSPEC 1.030 05/09/2013 1704   PHURINE 5.0 05/09/2013 1704   GLUCOSEU 1000* 05/09/2013 1704   HGBUR NEG 05/09/2013 1704   BILIRUBINUR NEG 05/09/2013 1704   KETONESUR NEG 05/09/2013 1704   PROTEINUR NEG 05/09/2013 1704   UROBILINOGEN 0.2 05/09/2013 1704   NITRITE NEG 05/09/2013 1704   LEUKOCYTESUR NEG 05/09/2013 1704    Urine Microalbumin Lab Results  Component Value Date   MICROALBUR 21.35* 06/12/2013    Imaging N/A   Assessment & Plan:   Assessment and plan was discussed and formulated with my attending.  Patient should return to the [facility] in 6 weeks to follow-up with Dr. Dareen Piano and repeat [facility].

## 2013-09-09 ENCOUNTER — Other Ambulatory Visit: Payer: Self-pay | Admitting: Internal Medicine

## 2013-09-13 ENCOUNTER — Inpatient Hospital Stay (HOSPITAL_COMMUNITY): Admission: RE | Admit: 2013-09-13 | Payer: PRIVATE HEALTH INSURANCE | Source: Ambulatory Visit

## 2013-09-19 ENCOUNTER — Encounter (HOSPITAL_COMMUNITY): Payer: Self-pay | Admitting: Emergency Medicine

## 2013-09-19 ENCOUNTER — Emergency Department (HOSPITAL_COMMUNITY)
Admission: EM | Admit: 2013-09-19 | Discharge: 2013-09-21 | Disposition: A | Discharge status: Home / Self Care | Payer: PRIVATE HEALTH INSURANCE | Attending: Emergency Medicine | Admitting: Emergency Medicine

## 2013-09-19 DIAGNOSIS — E781 Pure hyperglyceridemia: Secondary | ICD-10-CM | POA: Insufficient documentation

## 2013-09-19 DIAGNOSIS — Z8546 Personal history of malignant neoplasm of prostate: Secondary | ICD-10-CM | POA: Insufficient documentation

## 2013-09-19 DIAGNOSIS — Z8673 Personal history of transient ischemic attack (TIA), and cerebral infarction without residual deficits: Secondary | ICD-10-CM | POA: Diagnosis not present

## 2013-09-19 DIAGNOSIS — Z8619 Personal history of other infectious and parasitic diseases: Secondary | ICD-10-CM | POA: Insufficient documentation

## 2013-09-19 DIAGNOSIS — F209 Schizophrenia, unspecified: Secondary | ICD-10-CM | POA: Diagnosis not present

## 2013-09-19 DIAGNOSIS — Z8739 Personal history of other diseases of the musculoskeletal system and connective tissue: Secondary | ICD-10-CM | POA: Diagnosis not present

## 2013-09-19 DIAGNOSIS — I5042 Chronic combined systolic (congestive) and diastolic (congestive) heart failure: Secondary | ICD-10-CM | POA: Diagnosis not present

## 2013-09-19 DIAGNOSIS — N182 Chronic kidney disease, stage 2 (mild): Secondary | ICD-10-CM | POA: Diagnosis not present

## 2013-09-19 DIAGNOSIS — R1013 Epigastric pain: Secondary | ICD-10-CM | POA: Diagnosis not present

## 2013-09-19 DIAGNOSIS — E119 Type 2 diabetes mellitus without complications: Secondary | ICD-10-CM | POA: Diagnosis not present

## 2013-09-19 DIAGNOSIS — Z862 Personal history of diseases of the blood and blood-forming organs and certain disorders involving the immune mechanism: Secondary | ICD-10-CM | POA: Insufficient documentation

## 2013-09-19 DIAGNOSIS — Z87891 Personal history of nicotine dependence: Secondary | ICD-10-CM | POA: Diagnosis not present

## 2013-09-19 DIAGNOSIS — F1010 Alcohol abuse, uncomplicated: Secondary | ICD-10-CM | POA: Diagnosis present

## 2013-09-19 DIAGNOSIS — Z98811 Dental restoration status: Secondary | ICD-10-CM | POA: Diagnosis not present

## 2013-09-19 DIAGNOSIS — R17 Unspecified jaundice: Secondary | ICD-10-CM | POA: Diagnosis not present

## 2013-09-19 DIAGNOSIS — K219 Gastro-esophageal reflux disease without esophagitis: Secondary | ICD-10-CM | POA: Insufficient documentation

## 2013-09-19 DIAGNOSIS — Z794 Long term (current) use of insulin: Secondary | ICD-10-CM | POA: Diagnosis not present

## 2013-09-19 DIAGNOSIS — I4891 Unspecified atrial fibrillation: Secondary | ICD-10-CM | POA: Diagnosis not present

## 2013-09-19 DIAGNOSIS — F10220 Alcohol dependence with intoxication, uncomplicated: Secondary | ICD-10-CM

## 2013-09-19 DIAGNOSIS — F1020 Alcohol dependence, uncomplicated: Secondary | ICD-10-CM | POA: Diagnosis not present

## 2013-09-19 DIAGNOSIS — I129 Hypertensive chronic kidney disease with stage 1 through stage 4 chronic kidney disease, or unspecified chronic kidney disease: Secondary | ICD-10-CM | POA: Diagnosis not present

## 2013-09-19 DIAGNOSIS — Z8669 Personal history of other diseases of the nervous system and sense organs: Secondary | ICD-10-CM | POA: Diagnosis not present

## 2013-09-19 LAB — COMPREHENSIVE METABOLIC PANEL
ALT: 72 U/L — ABNORMAL HIGH (ref 0–53)
ANION GAP: 18 — AB (ref 5–15)
AST: 58 U/L — AB (ref 0–37)
Albumin: 3.6 g/dL (ref 3.5–5.2)
Alkaline Phosphatase: 92 U/L (ref 39–117)
BUN: 28 mg/dL — AB (ref 6–23)
CO2: 18 mEq/L — ABNORMAL LOW (ref 19–32)
Calcium: 10.2 mg/dL (ref 8.4–10.5)
Chloride: 103 mEq/L (ref 96–112)
Creatinine, Ser: 1.37 mg/dL — ABNORMAL HIGH (ref 0.50–1.35)
GFR calc Af Amer: 64 mL/min — ABNORMAL LOW (ref >90)
GFR calc non Af Amer: 55 mL/min — ABNORMAL LOW (ref >90)
Glucose, Bld: 104 mg/dL — ABNORMAL HIGH (ref 70–99)
Potassium: 4 mEq/L (ref 3.7–5.3)
Sodium: 139 mEq/L (ref 137–147)
TOTAL PROTEIN: 7.8 g/dL (ref 6.0–8.3)
Total Bilirubin: 0.6 mg/dL (ref 0.3–1.2)

## 2013-09-19 LAB — CBC
HCT: 45.8 % (ref 39.0–52.0)
Hemoglobin: 15.5 g/dL (ref 13.0–17.0)
MCH: 28.5 pg (ref 26.0–34.0)
MCHC: 33.8 g/dL (ref 30.0–36.0)
MCV: 84.2 fL (ref 78.0–100.0)
Platelets: 177 K/uL (ref 150–400)
RBC: 5.44 MIL/uL (ref 4.22–5.81)
RDW: 15.6 % — ABNORMAL HIGH (ref 11.5–15.5)
WBC: 9.7 K/uL (ref 4.0–10.5)

## 2013-09-19 LAB — SALICYLATE LEVEL: Salicylate Lvl: <2 mg/dL — ABNORMAL LOW (ref 2.8–20.0)

## 2013-09-19 LAB — RAPID URINE DRUG SCREEN, HOSP PERFORMED
Amphetamines: NOT DETECTED
BENZODIAZEPINES: NOT DETECTED
Barbiturates: NOT DETECTED
COCAINE: NOT DETECTED
OPIATES: NOT DETECTED
Tetrahydrocannabinol: NOT DETECTED

## 2013-09-19 LAB — ACETAMINOPHEN LEVEL: Acetaminophen (Tylenol), Serum: <15 ug/mL (ref 10–30)

## 2013-09-19 LAB — ETHANOL: Alcohol, Ethyl (B): <11 mg/dL (ref 0–11)

## 2013-09-19 LAB — CBG MONITORING, ED: Glucose-Capillary: 113 mg/dL — ABNORMAL HIGH (ref 70–99)

## 2013-09-19 MED ORDER — NICOTINE 21 MG/24HR TD PT24
21.0000 mg | MEDICATED_PATCH | Freq: Every day | TRANSDERMAL | Status: DC
  Filled 2013-09-19: qty 1

## 2013-09-19 MED ORDER — ACETAMINOPHEN 325 MG PO TABS
650.0000 mg | ORAL_TABLET | ORAL | Status: DC | PRN
  Administered 2013-09-20 (×2): 650 mg via ORAL
  Filled 2013-09-19 (×2): qty 2

## 2013-09-19 MED ORDER — ZOLPIDEM TARTRATE 5 MG PO TABS: 5.0000 mg | ORAL_TABLET | Freq: Every evening | ORAL | Status: DC | PRN

## 2013-09-19 MED ORDER — ONDANSETRON HCL 4 MG PO TABS: 4.0000 mg | ORAL_TABLET | Freq: Three times a day (TID) | ORAL | Status: DC | PRN

## 2013-09-19 MED ORDER — LORAZEPAM 1 MG PO TABS
0.0000 mg | ORAL_TABLET | Freq: Four times a day (QID) | ORAL | Status: DC
  Administered 2013-09-20 (×2): 1 mg via ORAL
  Administered 2013-09-20: 2 mg via ORAL
  Administered 2013-09-20: 1 mg via ORAL
  Filled 2013-09-19 (×2): qty 1
  Filled 2013-09-19: qty 2
  Filled 2013-09-19: qty 1

## 2013-09-19 MED ORDER — IBUPROFEN 200 MG PO TABS: 600.0000 mg | ORAL_TABLET | Freq: Three times a day (TID) | ORAL | Status: DC | PRN

## 2013-09-19 MED ORDER — LORAZEPAM 1 MG PO TABS: 0.0000 mg | ORAL_TABLET | Freq: Two times a day (BID) | ORAL | Status: DC

## 2013-09-19 MED ORDER — THIAMINE HCL 100 MG/ML IJ SOLN: 100.0000 mg | Freq: Every day | INTRAMUSCULAR | Status: DC

## 2013-09-19 MED ORDER — VITAMIN B-1 100 MG PO TABS
100.0000 mg | ORAL_TABLET | Freq: Every day | ORAL | Status: DC
  Administered 2013-09-20 – 2013-09-21 (×2): 100 mg via ORAL
  Filled 2013-09-19 (×2): qty 1

## 2013-09-19 NOTE — ED Notes (Signed)
Pt states that he wants detox from ETOH. States that his last drink was about 1600 today. States that he drank a "fifth of wine" today and that he feels like he is shaking. Pt states he is hearing voices when he sleeps. Denies any command hallucinations.

## 2013-09-19 NOTE — ED Notes (Signed)
MD at bedside. 

## 2013-09-19 NOTE — ED Notes (Signed)
Pt ambulated to the restroom in attempt to collect urine sample.  

## 2013-09-19 NOTE — ED Provider Notes (Signed)
CSN: [id number]     Arrival date & time 09/19/13  2114 History   First MD Initiated Contact with Patient 09/19/13 2248     Chief Complaint  Patient presents with  . Drug / Alcohol Assessment   Patient is a 58 y.o. male presenting with drug/alcohol assessment.  Drug / Alcohol Assessment   Patient is a 58 y.o. Male who presents to the ED with complaints of alcohol withdrawal and hallucinations.  Patient states that he has been drinking a fifth of wine a day and wants to stop drinking.  Patient states that he last had a 40 oz. This afternoon at 5:00 pm.  Patient states that he is afraid of withdrawal symptoms.  He denies history of seizures.  Patient states that he is also hearing some voices especially when he has his eyes closed.  He also states that he sees spots when he has his eyes closed.  Patient states that he has been taking his medications like they are prescribed at home.  Patient states that he has some chronic low back pain which is unchanged from baseline.  Patient denies fever, chills, nausea, vomiting, chest pain, shortness of breath, diarrhea, constipation, urinary symptoms.  Patient denies any other substance abuse at this time.    Past Medical History  Diagnosis Date  . GERD (gastroesophageal reflux disease)   . Schizophrenia, schizo-affective   . Cataract   . Hyperlipidemia   . Wears glasses   . Wears dentures     upper  . Hypertension     uncontrolled with medication noncompliance  . Hepatitis C   . HIT (heparin-induced thrombocytopenia)   . Continuous chronic alcoholism   . H/O cocaine abuse     none in 6 months  . Stroke 01/2012    Small cerebellar infarcts right greater than left as well as questionable acute left external capsule and caudate nuclear punctate lacunar infarcts noted per MRI (01/2012) - presumed to be embolic likely source PFO with right to left shunt (noted per TEE 01/ 2014)  . Chronic combined systolic and diastolic CHF (congestive heart failure)  02/29/2012    LV EF 40-45% per 2D echo (02/2012) with grade 1 diastolic dysfunction. 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 Degenerative lumbar spinal stenosis     s/p L2-3, L3-4, L4-5 laminectomy partial facetectomy, and bilateral foraminotomy  . History of pancreatitis 01/2011    Admission for acute pancreatitis presumed 2/2 ongoing alcohol abuse- and hypertriglyceridemia  . PFO (patent foramen ovale) 01/2012    with right to left shunt, noted per TEE in evaluation for source of embolic stroke in 08/2954  . CKD (chronic kidney disease) stage 2, GFR 60-89 ml/min     BL SCr approximately 1-1.3  . 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 Hepatic steatosis     suspected 2/2 alcohol abuse  . Splenic cyst   . Colitis 05/2009    History of colitis of ascending colon noted on CT abd/pelvis (05/2009), with interval resolution with subsequent CT  . Hypertriglyceridemia   . Cancer     Prostate cancer-bx. 3 weeks ago  . History of ETOH abuse 12-13-12     no use in 6 months   Past Surgical History  Procedure Laterality Date  . L2-3, l3-4, l4-5 laminectomy, partial facetectomy    . Left achilles Right 2007  . Tonsillectomy    .  Knee arthroscopy    . I&d extremity  03/20/2011    Procedure: IRRIGATION AND DEBRIDEMENT EXTREMITY;  Surgeon: Nestor Lewandowsky, MD;  Location: [facility] OR;  Service: Orthopedics;  Laterality: Left;  I&D LEFT ACHILLIES TENDON  . Eye surgery      right eye  . Resection distal clavical  09/17/2011    Procedure: RESECTION DISTAL CLAVICAL;  Surgeon: Mable Paris, MD;  Location: [facility];  Service: Orthopedics;  Laterality: Right;  right shoulder arthroscopy with sad and open distal clavicle excision   . Esophagogastroduodenoscopy N/A 06/25/2012    Procedure: ESOPHAGOGASTRODUODENOSCOPY (EGD);  Surgeon: Rachael Fee, MD;  Location:  [facility] ENDOSCOPY;  Service: Endoscopy;  Laterality: N/A;  . Robot assisted laparoscopic radical prostatectomy N/A 12/16/2012    Procedure: ROBOTIC ASSISTED LAPAROSCOPIC PROSTATECTOMY ;  Surgeon: Crist Fat, MD;  Location: [facility] ORS;  Service: Urology;  Laterality: N/A;   Family History  Problem Relation Age of Onset  . CAD Mother 27  . CAD Sister   . CAD Brother 59    died from MI at age 32yo  . Hypertension     History  Substance Use Topics  . Smoking status: Former Smoker -- 0.50 packs/day for 30 years    Types: Cigarettes    Quit date: 06/24/2001  . Smokeless tobacco: Never Used  . Alcohol Use: Yes    Review of Systems  See HPI  Allergies  Heparin and Thorazine  Home Medications   Prior to Admission medications   Medication Sig Start Date End Date Taking? Authorizing Provider  ACCU-CHEK FASTCLIX LANCETS MISC Use as instructed to test four times a day. DX: 250.92, insulin requiring 05/25/13  Yes Alejandro Paya, DO  ARIPiprazole (ABILIFY) 20 MG tablet Take 20 mg by mouth at bedtime.   Yes Historical Provider, MD  Blood Glucose Monitoring Suppl (ACCU-CHEK NANO SMARTVIEW) W/DEVICE KIT Use as instructed to test four times a day. DX: 250.92, insulin requiring 05/25/13  Yes Jonah Blue, DO  carvedilol (COREG) 25 MG tablet Take 1 tablet (25 mg total) by mouth 2 (two) times daily with a meal. 08/01/13  Yes Amy D Clegg, NP  cloNIDine (CATAPRES) 0.1 MG tablet Take 1 tablet (0.1 mg total) by mouth 2 (two) times daily. 07/13/13  Yes Bevelyn Buckles Bensimhon, MD  glucose blood (ACCU-CHEK SMARTVIEW) test strip Use as instructed to test four times a day. DX: 250.92, insulin requiring 05/25/13  Yes Alejandro Paya, DO  insulin aspart (NOVOLOG) 100 UNIT/ML injection Inject 5 Units into the skin 3 (three) times daily with meals. 08/22/13  Yes Vijaya Len Childs, MD  Insulin Detemir (LEVEMIR FLEXTOUCH) 100 UNIT/ML Pen Inject 40 Units into the skin daily at 10 pm. 08/22/13  Yes Cathlean Cower,  MD  Insulin Pen Needle (FIFTY50 PEN NEEDLES) 31G X 5 MM MISC Use to inject insulin up to 4 times daily. Insulin dependent. diag code 250.00 05/16/13  Yes Jonah Blue, DO  lisinopril (PRINIVIL,ZESTRIL) 20 MG tablet Take 1 tablet (20 mg total) by mouth 2 (two) times daily. 05/31/13  Yes Aundria Rud, NP  metolazone (ZAROXOLYN) 2.5 MG tablet Take 2.5 mg by mouth as needed (for fluid).    Yes Historical Provider, MD  pantoprazole (PROTONIX) 40 MG tablet Take 1 tablet (40 mg total) by mouth 2 (two) times daily. 07/24/13  Yes Earl Lagos, MD  potassium chloride (K-DUR) 10 MEQ tablet Take 1 tablet (10 mEq total) by mouth 2 (two) times daily. Take extra 2 tabs  when take Metolazone 03/22/13  Yes Jolaine Artist, MD  rosuvastatin (CRESTOR) 20 MG tablet Take 20 mg by mouth daily.   Yes Historical Provider, MD  torsemide (DEMADEX) 20 MG tablet Take 2 tablets (40 mg total) by mouth daily. 08/22/13  Yes Malena Catholic, MD  traZODone (DESYREL) 100 MG tablet Take 100 mg by mouth at bedtime. 08/25/12  Yes Alejandro Paya, DO  XARTEMIS XR 7.5-325 MG TBCR Take 1 tablet by mouth 2 (two) times daily as needed. 08/24/13  Yes Historical Provider, MD   BP 112/62  Pulse 74  Temp(Src) 98.1 F (36.7 C) (Oral)  Resp 16  SpO2 97% Physical Exam  Nursing note and vitals reviewed. Constitutional: He is oriented to person, place, and time. He appears well-developed and well-nourished. No distress.  HENT:  Head: Normocephalic and atraumatic.  Mouth/Throat: Oropharynx is clear and moist. No oropharyngeal exudate.  Eyes: EOM are normal. Pupils are equal, round, and reactive to light. Scleral icterus is present.  Bilateral scleral icterus, with senile rings around the cornea.    Neck: Normal range of motion. Neck supple. No JVD present. No thyromegaly present.  Cardiovascular: Normal rate, regular rhythm, normal heart sounds and intact distal pulses.  Exam reveals no gallop and no friction rub.   No murmur  heard. Pulmonary/Chest: Effort normal and breath sounds normal. No respiratory distress. He has no wheezes. He has no rales. He exhibits no tenderness.  Abdominal: Soft. Bowel sounds are normal. He exhibits no distension and no mass. There is tenderness in the epigastric area. There is no rigidity, no rebound, no guarding, no tenderness at McBurney's point and negative Murphy's sign.  Obese abdomen with multiple well healed scars  Musculoskeletal: Normal range of motion.  Lymphadenopathy:    He has no cervical adenopathy.  Neurological: He is alert and oriented to person, place, and time. He has normal strength. No cranial nerve deficit or sensory deficit. Coordination normal.  Skin: Skin is warm and dry. He is not diaphoretic.  Psychiatric: He is slowed and withdrawn. He expresses no homicidal and no suicidal ideation. He expresses no suicidal plans and no homicidal plans.    ED Course  Procedures (including critical care time) Labs Review Labs Reviewed  CBG MONITORING, ED - Abnormal; Notable for the following:    Glucose-Capillary 113 (*)    All other components within normal limits  ACETAMINOPHEN LEVEL  CBC  COMPREHENSIVE METABOLIC PANEL  ETHANOL  SALICYLATE LEVEL  URINE RAPID DRUG SCREEN (HOSP PERFORMED)  URINALYSIS, ROUTINE W REFLEX MICROSCOPIC  CBG MONITORING, ED  CBG MONITORING, ED    Imaging Review No results found.   EKG Interpretation None      MDM   Final diagnoses:  Alcohol dependence with uncomplicated intoxication  SCHIZOPHRENIA  Type 2 diabetes mellitus without complication   Patient is a 58 y.o. Male with a PMH of schizophrenia and alcohol dependence who presents to the ED with wish to detox from alcohol with current feelings of jitteriness.  Patient is also admitting to some auditory hallucinations.  Basic labs were drawn here at this time and are currently pending.  Patient changed into paper scrubs and was moved to the psych ED.  CIWA protocol was  initiated.  Patient to be seen by TTS who will help to determine placement.  Appreciate their input at this time.  Dr. Wyvonnia Dusky is aware of the patient at this time.    Cherylann Parr, PA-C 09/19/13 2319

## 2013-09-20 ENCOUNTER — Emergency Department (HOSPITAL_COMMUNITY): Payer: PRIVATE HEALTH INSURANCE

## 2013-09-20 ENCOUNTER — Encounter (HOSPITAL_COMMUNITY): Payer: Self-pay | Admitting: Registered Nurse

## 2013-09-20 DIAGNOSIS — F1020 Alcohol dependence, uncomplicated: Secondary | ICD-10-CM | POA: Diagnosis not present

## 2013-09-20 DIAGNOSIS — F1010 Alcohol abuse, uncomplicated: Secondary | ICD-10-CM | POA: Diagnosis present

## 2013-09-20 LAB — URINALYSIS, ROUTINE W REFLEX MICROSCOPIC
Glucose, UA: NEGATIVE mg/dL
HGB URINE DIPSTICK: NEGATIVE
Ketones, ur: NEGATIVE mg/dL
Leukocytes, UA: NEGATIVE
Nitrite: NEGATIVE
PH: 5.5 (ref 5.0–8.0)
Protein, ur: 30 mg/dL — AB
SPECIFIC GRAVITY, URINE: 1.027 (ref 1.005–1.030)
Urobilinogen, UA: 1 mg/dL (ref 0.0–1.0)

## 2013-09-20 LAB — PRO B NATRIURETIC PEPTIDE: PRO B NATRI PEPTIDE: 392.5 pg/mL — AB (ref 0–125)

## 2013-09-20 LAB — BASIC METABOLIC PANEL
Anion gap: 17 — ABNORMAL HIGH (ref 5–15)
Anion gap: 15 (ref 5–15)
BUN: 28 mg/dL — ABNORMAL HIGH (ref 6–23)
BUN: 29 mg/dL — ABNORMAL HIGH (ref 6–23)
CALCIUM: 9.9 mg/dL (ref 8.4–10.5)
CO2: 20 mEq/L (ref 19–32)
CO2: 18 meq/L — AB (ref 19–32)
Calcium: 10.1 mg/dL (ref 8.4–10.5)
Chloride: 102 mEq/L (ref 96–112)
Chloride: 99 mEq/L (ref 96–112)
Creatinine, Ser: 1.44 mg/dL — ABNORMAL HIGH (ref 0.50–1.35)
Creatinine, Ser: 1.27 mg/dL (ref 0.50–1.35)
GFR calc Af Amer: 60 mL/min — ABNORMAL LOW (ref >90)
GFR calc Af Amer: 70 mL/min — ABNORMAL LOW (ref >90)
GFR calc non Af Amer: 60 mL/min — ABNORMAL LOW (ref >90)
GFR, EST NON AFRICAN AMERICAN: 52 mL/min — AB (ref >90)
Glucose, Bld: 122 mg/dL — ABNORMAL HIGH (ref 70–99)
Glucose, Bld: 136 mg/dL — ABNORMAL HIGH (ref 70–99)
POTASSIUM: 3.8 meq/L (ref 3.7–5.3)
Potassium: 3.9 mEq/L (ref 3.7–5.3)
SODIUM: 139 meq/L (ref 137–147)
Sodium: 132 mEq/L — ABNORMAL LOW (ref 137–147)

## 2013-09-20 LAB — CBG MONITORING, ED
GLUCOSE-CAPILLARY: 121 mg/dL — AB (ref 70–99)
GLUCOSE-CAPILLARY: 141 mg/dL — AB (ref 70–99)
GLUCOSE-CAPILLARY: 173 mg/dL — AB (ref 70–99)
Glucose-Capillary: 104 mg/dL — ABNORMAL HIGH (ref 70–99)
Glucose-Capillary: 103 mg/dL — ABNORMAL HIGH (ref 70–99)

## 2013-09-20 LAB — I-STAT TROPONIN, ED: Troponin i, poc: 0.05 ng/mL (ref 0.00–0.08)

## 2013-09-20 LAB — MAGNESIUM: Magnesium: 1.8 mg/dL (ref 1.5–2.5)

## 2013-09-20 LAB — URINE MICROSCOPIC-ADD ON

## 2013-09-20 MED ORDER — METOLAZONE 2.5 MG PO TABS
2.5000 mg | ORAL_TABLET | ORAL | Status: DC | PRN
  Filled 2013-09-20: qty 1

## 2013-09-20 MED ORDER — INSULIN DETEMIR 100 UNIT/ML ~~LOC~~ SOLN
40.0000 [IU] | Freq: Every day | SUBCUTANEOUS | Status: DC
  Administered 2013-09-21: 40 [IU] via SUBCUTANEOUS
  Filled 2013-09-20: qty 0.4

## 2013-09-20 MED ORDER — POTASSIUM CHLORIDE ER 10 MEQ PO TBCR
10.0000 meq | EXTENDED_RELEASE_TABLET | Freq: Two times a day (BID) | ORAL | Status: DC
  Administered 2013-09-20 – 2013-09-21 (×3): 10 meq via ORAL
  Filled 2013-09-20 (×4): qty 1

## 2013-09-20 MED ORDER — INSULIN ASPART 100 UNIT/ML ~~LOC~~ SOLN: 0.0000 [IU] | Freq: Three times a day (TID) | SUBCUTANEOUS | Status: DC

## 2013-09-20 MED ORDER — RIVAROXABAN 20 MG PO TABS
20.0000 mg | ORAL_TABLET | Freq: Every day | ORAL | Status: DC
  Administered 2013-09-21: 20 mg via ORAL
  Filled 2013-09-20: qty 1

## 2013-09-20 MED ORDER — ARIPIPRAZOLE 10 MG PO TABS
20.0000 mg | ORAL_TABLET | Freq: Every day | ORAL | Status: DC
  Administered 2013-09-21: 20 mg via ORAL
  Filled 2013-09-20 (×2): qty 2

## 2013-09-20 MED ORDER — TORSEMIDE 20 MG PO TABS
40.0000 mg | ORAL_TABLET | Freq: Every day | ORAL | Status: DC
  Administered 2013-09-20 – 2013-09-21 (×2): 40 mg via ORAL
  Filled 2013-09-20 (×2): qty 2

## 2013-09-20 MED ORDER — CARVEDILOL 25 MG PO TABS
25.0000 mg | ORAL_TABLET | Freq: Two times a day (BID) | ORAL | Status: DC
  Administered 2013-09-20 – 2013-09-21 (×3): 25 mg via ORAL
  Filled 2013-09-20 (×5): qty 1

## 2013-09-20 MED ORDER — LISINOPRIL 20 MG PO TABS
20.0000 mg | ORAL_TABLET | Freq: Every day | ORAL | Status: DC
  Administered 2013-09-20 – 2013-09-21 (×2): 20 mg via ORAL
  Filled 2013-09-20 (×2): qty 1

## 2013-09-20 MED ORDER — OXYCODONE-ACETAMINOPHEN 5-325 MG PO TABS
1.0000 | ORAL_TABLET | Freq: Four times a day (QID) | ORAL | Status: DC | PRN
  Administered 2013-09-20 – 2013-09-21 (×2): 2 via ORAL
  Filled 2013-09-20 (×2): qty 2

## 2013-09-20 MED ORDER — ATORVASTATIN CALCIUM 20 MG PO TABS
20.0000 mg | ORAL_TABLET | Freq: Every day | ORAL | Status: DC
  Administered 2013-09-20: 20 mg via ORAL
  Filled 2013-09-20 (×2): qty 1

## 2013-09-20 MED ORDER — PANTOPRAZOLE SODIUM 40 MG PO TBEC
40.0000 mg | DELAYED_RELEASE_TABLET | Freq: Two times a day (BID) | ORAL | Status: DC
  Administered 2013-09-20 – 2013-09-21 (×3): 40 mg via ORAL
  Filled 2013-09-20 (×3): qty 1

## 2013-09-20 MED ORDER — TRAZODONE HCL 100 MG PO TABS
100.0000 mg | ORAL_TABLET | Freq: Every day | ORAL | Status: DC
  Administered 2013-09-20: 100 mg via ORAL
  Filled 2013-09-20: qty 1

## 2013-09-20 MED ORDER — CLONIDINE HCL 0.1 MG PO TABS
0.1000 mg | ORAL_TABLET | Freq: Two times a day (BID) | ORAL | Status: DC
  Administered 2013-09-20 – 2013-09-21 (×3): 0.1 mg via ORAL
  Filled 2013-09-20 (×3): qty 1

## 2013-09-20 NOTE — ED Notes (Signed)
Dr. Leonides Schanz informed that patient complains of SOB and that patient heart rate has brady down into 30's. Dr. Leonides Schanz gave orders to have patient moved to acute side.

## 2013-09-20 NOTE — Consult Note (Signed)
[facility] Consult   Reason for Consult:  Alcohol abuse Referring Physician:  EDP  [name] [name] is an 58 y.o. male. Total Time spent with patient: 45 minutes  Assessment: AXIS I:  Alcohol Abuse AXIS II:  Deferred AXIS III:   Past Medical History  Diagnosis Date  . GERD (gastroesophageal reflux disease)   . Schizophrenia, schizo-affective   . Cataract   . Hyperlipidemia   . Wears glasses   . Wears dentures     upper  . Hypertension     uncontrolled with medication noncompliance  . Hepatitis C   . HIT (heparin-induced thrombocytopenia)   . Continuous chronic alcoholism   . H/O cocaine abuse     none in 6 months  . Stroke 01/2012    Small cerebellar infarcts right greater than left as well as questionable acute left external capsule and caudate nuclear punctate lacunar infarcts noted per MRI (01/2012) - presumed to be embolic likely source PFO with right to left shunt (noted per TEE 01/ 2014)  . Chronic combined systolic and diastolic CHF (congestive heart failure) 02/29/2012    LV EF 40-45% per 2D echo (02/2012) with grade 1 diastolic dysfunction. 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 Degenerative lumbar spinal stenosis     s/p L2-3, L3-4, L4-5 laminectomy partial facetectomy, and bilateral foraminotomy  . History of pancreatitis 01/2011    Admission for acute pancreatitis presumed 2/2 ongoing alcohol abuse- and hypertriglyceridemia  . PFO (patent foramen ovale) 01/2012    with right to left shunt, noted per TEE in evaluation for source of embolic stroke in 03/2669  . CKD (chronic kidney disease) stage 2, GFR 60-89 ml/min     BL SCr approximately 1-1.3  . 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 Hepatic steatosis     suspected 2/2 alcohol abuse  . Splenic cyst   . Colitis 05/2009    History of colitis of ascending colon noted  on CT abd/pelvis (05/2009), with interval resolution with subsequent CT  . Hypertriglyceridemia   . Cancer     Prostate cancer-bx. 3 weeks ago  . History of ETOH abuse 12-13-12     no use in 6 months   AXIS IV:  other psychosocial or environmental problems AXIS V:  51-60 moderate symptoms  Plan:  No evidence of imminent risk to self or others at present.   Recommend psychiatric Inpatient admission when medically cleared. Supportive therapy provided about ongoing stressors. Discussed crisis plan, support from social network, calling 911, coming to the Emergency Department, and calling Suicide Hotline. 24 hour observation  Subjective:   [name] [name] is a 58 y.o. male patient admitted with Alcohol Abuse.  HPI:  Patient states that he drinks 1 gallon of alcohol daily.  Patient states that yesterday he only drank half of fifth because he was trying to quit but couldn't do it alone and was afraid that he would drink more so he came to the hospital.  Patient denies suicidal/homicidal ideation, psychosis, and paranoia.   Patient denies history seizures.  Patient states that he does have a history of schizophrenia and takes Abilify.    HPI Elements:   Location:  alcohol abuse. Quality:  alcohol withdrawal. Severity:  alcohol with drawal. Timing:  years.  Past Psychiatric History: Past Medical History  Diagnosis Date  . GERD (gastroesophageal reflux disease)   .  Schizophrenia, schizo-affective   . Cataract   . Hyperlipidemia   . Wears glasses   . Wears dentures     upper  . Hypertension     uncontrolled with medication noncompliance  . Hepatitis C   . HIT (heparin-induced thrombocytopenia)   . Continuous chronic alcoholism   . H/O cocaine abuse     none in 6 months  . Stroke 01/2012    Small cerebellar infarcts right greater than left as well as questionable acute left external capsule and caudate nuclear punctate lacunar infarcts noted per MRI (01/2012) - presumed to be  embolic likely source PFO with right to left shunt (noted per TEE 01/ 2014)  . Chronic combined systolic and diastolic CHF (congestive heart failure) 02/29/2012    LV EF 40-45% per 2D echo (02/2012) with grade 1 diastolic dysfunction. 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 Degenerative lumbar spinal stenosis     s/p L2-3, L3-4, L4-5 laminectomy partial facetectomy, and bilateral foraminotomy  . History of pancreatitis 01/2011    Admission for acute pancreatitis presumed 2/2 ongoing alcohol abuse- and hypertriglyceridemia  . PFO (patent foramen ovale) 01/2012    with right to left shunt, noted per TEE in evaluation for source of embolic stroke in 05/4096  . CKD (chronic kidney disease) stage 2, GFR 60-89 ml/min     BL SCr approximately 1-1.3  . 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 Hepatic steatosis     suspected 2/2 alcohol abuse  . Splenic cyst   . Colitis 05/2009    History of colitis of ascending colon noted on CT abd/pelvis (05/2009), with interval resolution with subsequent CT  . Hypertriglyceridemia   . Cancer     Prostate cancer-bx. 3 weeks ago  . History of ETOH abuse 12-13-12     no use in 6 months    reports that he quit smoking about 12 years ago. His smoking use included Cigarettes. He has a 15 pack-year smoking history. He has never used smokeless tobacco. He reports that he drinks alcohol. He reports that he does not use illicit drugs. Family History  Problem Relation Age of Onset  . CAD Mother 74  . CAD Sister   . CAD Brother 29    died from MI at age 69yo  . Hypertension     Family History Substance Abuse: No Family Supports: Yes, List: (sister (2), aunt, nieces, and brother ) Living Arrangements: Other (Comment) (lives in a apartment alone; has a aid that helps me) Can pt return to current living arrangement?: Yes Abuse/Neglect  [facility]) Physical Abuse: Denies Verbal Abuse: Denies Sexual Abuse: Denies Allergies:   Allergies  Allergen Reactions  . Heparin Other (See Comments)    Documented HIT under problem list Pt states he's not allergic.  Marland Kitchen Thorazine [Chlorpromazine Hcl] Other (See Comments)    Body freezes up    ACT Assessment Complete:  Yes:    Educational Status    Risk to Self: Risk to self with the past 6 months Suicidal Ideation: No Suicidal Intent: No Is patient at risk for suicide?: No Suicidal Plan?: No Access to Means: No What has been your use of drugs/alcohol within the last 12 months?:  (alcohol ) Previous Attempts/Gestures: No How many times?:  (03) Other Self Harm Risks:  (none reported ) Triggers for Past Attempts: Other (Comment) (none reported ) Intentional  Self Injurious Behavior: None Family Suicide History: No Recent stressful life event(s): Other (Comment) ("operation for cancer & I have to wear pull-ups";sexual px's) Persecutory voices/beliefs?: No Depression: Yes Depression Symptoms: Feeling angry/irritable;Loss of interest in usual pleasures;Feeling worthless/self pity;Guilt;Fatigue;Isolating;Tearfulness;Insomnia;Despondent Substance abuse history and/or treatment for substance abuse?: Yes Suicide prevention information given to non-admitted patients: Not applicable  Risk to Others: Risk to Others within the past 6 months Homicidal Ideation: No Thoughts of Harm to Others: No Current Homicidal Intent: No Current Homicidal Plan: No Access to Homicidal Means: No Identified Victim:  (n/a) History of harm to others?: No Assessment of Violence: None Noted Violent Behavior Description:  (patient calm and cooperative ) Does patient have access to weapons?: No Criminal Charges Pending?: No Describe Pending Criminal Charges:  (no current; past hx of trespassing & disorderly conduct ) Does patient have a court date: No  Abuse: Abuse/Neglect Assessment (Assessment to be complete  while patient is alone) Physical Abuse: Denies Verbal Abuse: Denies Sexual Abuse: Denies Exploitation of patient/patient's resources: Denies Self-Neglect: Denies  Prior Inpatient Therapy: Prior Inpatient Therapy Prior Inpatient Therapy: Yes Prior Therapy Dates:  ([facility]- yrs and "facility in [place]"-19 yrs ago) Prior Therapy Facilty/Provider(s):  [facility] and "facility in [place]") Reason for Treatment:  (detox and substance abuse )  Prior Outpatient Therapy: Prior Outpatient Therapy Prior Outpatient Therapy: Yes Prior Therapy Dates:  (current ) Prior Therapy Facilty/Provider(s):  (psychiatrist-pt unable to recall name of provider) Reason for Treatment:  (medication managment )  Additional Information: Additional Information 1:1 In Past 12 Months?: No CIRT Risk: No Elopement Risk: No Does patient have medical clearance?: Yes       Objective: Blood pressure 134/69, pulse 61, temperature 98.6 F (37 C), temperature source Oral, resp. rate 18, SpO2 100.00%.There is no weight on file to calculate BMI. Results for orders placed during the hospital encounter of 09/19/13 (from the past 72 hour(s))  CBG MONITORING, ED     Status: Abnormal   Collection Time    09/19/13 10:14 PM      Result Value Ref Range   Glucose-Capillary 113 (*) 70 - 99 mg/dL  URINE RAPID DRUG SCREEN (HOSP PERFORMED)     Status: None   Collection Time    09/19/13 11:08 PM      Result Value Ref Range   Opiates NONE DETECTED  NONE DETECTED   Cocaine NONE DETECTED  NONE DETECTED   Benzodiazepines NONE DETECTED  NONE DETECTED   Amphetamines NONE DETECTED  NONE DETECTED   Tetrahydrocannabinol NONE DETECTED  NONE DETECTED   Barbiturates NONE DETECTED  NONE DETECTED   Comment:            DRUG SCREEN FOR MEDICAL PURPOSES     ONLY.  IF CONFIRMATION IS NEEDED     FOR ANY PURPOSE, NOTIFY LAB     WITHIN 5 DAYS.                LOWEST DETECTABLE LIMITS     FOR URINE DRUG SCREEN     Drug Class       Cutoff  (ng/mL)     Amphetamine      1000     Barbiturate      200     Benzodiazepine   094     Tricyclics       709     Opiates          300     Cocaine  300     THC              50  URINALYSIS, ROUTINE W REFLEX MICROSCOPIC     Status: Abnormal   Collection Time    09/19/13 11:08 PM      Result Value Ref Range   Color, Urine YELLOW  YELLOW   APPearance CLEAR  CLEAR   Specific Gravity, Urine 1.027  1.005 - 1.030   pH 5.5  5.0 - 8.0   Glucose, UA NEGATIVE  NEGATIVE mg/dL   Hgb urine dipstick NEGATIVE  NEGATIVE   Bilirubin Urine SMALL (*) NEGATIVE   Ketones, ur NEGATIVE  NEGATIVE mg/dL   Protein, ur 30 (*) NEGATIVE mg/dL   Urobilinogen, UA 1.0  0.0 - 1.0 mg/dL   Nitrite NEGATIVE  NEGATIVE   Leukocytes, UA NEGATIVE  NEGATIVE  URINE MICROSCOPIC-ADD ON     Status: Abnormal   Collection Time    09/19/13 11:08 PM      Result Value Ref Range   Casts HYALINE CASTS (*) NEGATIVE  ACETAMINOPHEN LEVEL     Status: None   Collection Time    09/19/13 11:11 PM      Result Value Ref Range   Acetaminophen (Tylenol), Serum <15.0  10 - 30 ug/mL   Comment:            THERAPEUTIC CONCENTRATIONS VARY     SIGNIFICANTLY. A RANGE OF 10-30     ug/mL MAY BE AN EFFECTIVE     CONCENTRATION FOR MANY PATIENTS.     HOWEVER, SOME ARE BEST TREATED     AT CONCENTRATIONS OUTSIDE THIS     RANGE.     ACETAMINOPHEN CONCENTRATIONS     >150 ug/mL AT 4 HOURS AFTER     INGESTION AND >50 ug/mL AT 12     HOURS AFTER INGESTION ARE     OFTEN ASSOCIATED WITH TOXIC     REACTIONS.  CBC     Status: Abnormal   Collection Time    09/19/13 11:11 PM      Result Value Ref Range   WBC 9.7  4.0 - 10.5 K/uL   RBC 5.44  4.22 - 5.81 MIL/uL   Hemoglobin 15.5  13.0 - 17.0 g/dL   HCT 45.8  39.0 - 52.0 %   MCV 84.2  78.0 - 100.0 fL   MCH 28.5  26.0 - 34.0 pg   MCHC 33.8  30.0 - 36.0 g/dL   RDW 15.6 (*) 11.5 - 15.5 %   Platelets 177  150 - 400 K/uL  COMPREHENSIVE METABOLIC PANEL     Status: Abnormal   Collection Time     09/19/13 11:11 PM      Result Value Ref Range   Sodium 139  137 - 147 mEq/L   Potassium 4.0  3.7 - 5.3 mEq/L   Chloride 103  96 - 112 mEq/L   CO2 18 (*) 19 - 32 mEq/L   Glucose, Bld 104 (*) 70 - 99 mg/dL   BUN 28 (*) 6 - 23 mg/dL   Creatinine, Ser 1.37 (*) 0.50 - 1.35 mg/dL   Calcium 10.2  8.4 - 10.5 mg/dL   Total Protein 7.8  6.0 - 8.3 g/dL   Albumin 3.6  3.5 - 5.2 g/dL   AST 58 (*) 0 - 37 U/L   ALT 72 (*) 0 - 53 U/L   Alkaline Phosphatase 92  39 - 117 U/L   Total Bilirubin 0.6  0.3 - 1.2 mg/dL  GFR calc non Af Amer 55 (*) >90 mL/min   GFR calc Af Amer 64 (*) >90 mL/min   Comment: (NOTE)     The eGFR has been calculated using the CKD EPI equation.     This calculation has not been validated in all clinical situations.     eGFR's persistently <90 mL/min signify possible Chronic Kidney     Disease.   Anion gap 18 (*) 5 - 15  ETHANOL     Status: None   Collection Time    09/19/13 11:11 PM      Result Value Ref Range   Alcohol, Ethyl (B) <11  0 - 11 mg/dL   Comment:            LOWEST DETECTABLE LIMIT FOR     SERUM ALCOHOL IS 11 mg/dL     FOR MEDICAL PURPOSES ONLY  SALICYLATE LEVEL     Status: Abnormal   Collection Time    09/19/13 11:11 PM      Result Value Ref Range   Salicylate Lvl <4.1 (*) 2.8 - 20.0 mg/dL  BASIC METABOLIC PANEL     Status: Abnormal   Collection Time    09/20/13 12:43 AM      Result Value Ref Range   Sodium 139  137 - 147 mEq/L   Potassium 3.8  3.7 - 5.3 mEq/L   Chloride 102  96 - 112 mEq/L   CO2 20  19 - 32 mEq/L   Glucose, Bld 122 (*) 70 - 99 mg/dL   BUN 28 (*) 6 - 23 mg/dL   Creatinine, Ser 1.44 (*) 0.50 - 1.35 mg/dL   Calcium 10.1  8.4 - 10.5 mg/dL   GFR calc non Af Amer 52 (*) >90 mL/min   GFR calc Af Amer 60 (*) >90 mL/min   Comment: (NOTE)     The eGFR has been calculated using the CKD EPI equation.     This calculation has not been validated in all clinical situations.     eGFR's persistently <90 mL/min signify possible Chronic Kidney      Disease.   Anion gap 17 (*) 5 - 15   Labs are reviewed see above values.  Medication reviewed and no changes made.  Current Facility-Administered Medications  Medication Dose Route Frequency Provider Last Rate Last Dose  . acetaminophen (TYLENOL) tablet 650 mg  650 mg Oral Q4H PRN Courtney A Forcucci, PA-C   650 mg at 09/20/13 0647  . ARIPiprazole (ABILIFY) tablet 20 mg  20 mg Oral QHS Ezequiel Essex, MD      . atorvastatin (LIPITOR) tablet 20 mg  20 mg Oral q1800 Ezequiel Essex, MD      . carvedilol (COREG) tablet 25 mg  25 mg Oral BID WC Ezequiel Essex, MD   25 mg at 09/20/13 0820  . cloNIDine (CATAPRES) tablet 0.1 mg  0.1 mg Oral BID Ezequiel Essex, MD   0.1 mg at 09/20/13 0932  . ibuprofen (ADVIL,MOTRIN) tablet 600 mg  600 mg Oral Q8H PRN Courtney A Forcucci, PA-C      . insulin aspart (novoLOG) injection 0-9 Units  0-9 Units Subcutaneous TID WC Ezequiel Essex, MD      . insulin detemir (LEVEMIR) injection 40 Units  40 Units Subcutaneous Q2200 Ezequiel Essex, MD      . lisinopril (PRINIVIL,ZESTRIL) tablet 20 mg  20 mg Oral Daily Ezequiel Essex, MD   20 mg at 09/20/13 0932  . LORazepam (ATIVAN) tablet 0-4 mg  0-4  mg Oral 4 times per day Courtney A Forcucci, PA-C   2 mg at 09/20/13 1255   Followed by  . [START ON 09/22/2013] LORazepam (ATIVAN) tablet 0-4 mg  0-4 mg Oral Q12H Courtney A Forcucci, PA-C      . metolazone (ZAROXOLYN) tablet 2.5 mg  2.5 mg Oral PRN Ezequiel Essex, MD      . nicotine (NICODERM CQ - dosed in mg/24 hours) patch 21 mg  21 mg Transdermal Daily Courtney A Forcucci, PA-C      . ondansetron (ZOFRAN) tablet 4 mg  4 mg Oral Q8H PRN Courtney A Forcucci, PA-C      . oxyCODONE-acetaminophen (PERCOCET/ROXICET) 5-325 MG per tablet 1-2 tablet  1-2 tablet Oral Q6H PRN Leota Jacobsen, MD   2 tablet at 09/20/13 1101  . pantoprazole (PROTONIX) EC tablet 40 mg  40 mg Oral BID Ezequiel Essex, MD   40 mg at 09/20/13 0932  . potassium chloride (K-DUR) CR tablet 10 mEq  10  mEq Oral BID Ezequiel Essex, MD   10 mEq at 09/20/13 0932  . thiamine (VITAMIN B-1) tablet 100 mg  100 mg Oral Daily Courtney A Forcucci, PA-C   100 mg at 09/20/13 0932   Or  . thiamine (B-1) injection 100 mg  100 mg Intravenous Daily Courtney A Forcucci, PA-C      . torsemide (DEMADEX) tablet 40 mg  40 mg Oral Daily Ezequiel Essex, MD   40 mg at 09/20/13 0932  . traZODone (DESYREL) tablet 100 mg  100 mg Oral QHS Ezequiel Essex, MD      . zolpidem (AMBIEN) tablet 5 mg  5 mg Oral QHS PRN Cherylann Parr, PA-C       Current Outpatient Prescriptions  Medication Sig Dispense Refill  . ACCU-CHEK FASTCLIX LANCETS MISC Use as instructed to test four times a day. DX: 250.92, insulin requiring  204 each  2  . ARIPiprazole (ABILIFY) 20 MG tablet Take 20 mg by mouth at bedtime.      . Blood Glucose Monitoring Suppl (ACCU-CHEK NANO SMARTVIEW) W/DEVICE KIT Use as instructed to test four times a day. DX: 250.92, insulin requiring  1 kit  0  . carvedilol (COREG) 25 MG tablet Take 1 tablet (25 mg total) by mouth 2 (two) times daily with a meal.  60 tablet  6  . cloNIDine (CATAPRES) 0.1 MG tablet Take 1 tablet (0.1 mg total) by mouth 2 (two) times daily.  60 tablet  3  . glucose blood (ACCU-CHEK SMARTVIEW) test strip Use as instructed to test four times a day. DX: 250.92, insulin requiring  150 each  2  . insulin aspart (NOVOLOG) 100 UNIT/ML injection Inject 5 Units into the skin 3 (three) times daily with meals.  10 mL  11  . Insulin Detemir (LEVEMIR FLEXTOUCH) 100 UNIT/ML Pen Inject 40 Units into the skin daily at 10 pm.  15 mL  11  . Insulin Pen Needle (FIFTY50 PEN NEEDLES) 31G X 5 MM MISC Use to inject insulin up to 4 times daily. Insulin dependent. diag code 250.00  150 each  5  . lisinopril (PRINIVIL,ZESTRIL) 20 MG tablet Take 1 tablet (20 mg total) by mouth 2 (two) times daily.  60 tablet  3  . metolazone (ZAROXOLYN) 2.5 MG tablet Take 2.5 mg by mouth as needed (for fluid).       . pantoprazole  (PROTONIX) 40 MG tablet Take 1 tablet (40 mg total) by mouth 2 (two) times daily.  [place]  tablet  2  . potassium chloride (K-DUR) 10 MEQ tablet Take 1 tablet (10 mEq total) by mouth 2 (two) times daily. Take extra 2 tabs when take Metolazone  70 tablet  3  . rosuvastatin (CRESTOR) 20 MG tablet Take 20 mg by mouth daily.      Marland Kitchen torsemide (DEMADEX) 20 MG tablet Take 2 tablets (40 mg total) by mouth daily.  120 tablet  6  . traZODone (DESYREL) 100 MG tablet Take 100 mg by mouth at bedtime.      Marland Kitchen XARTEMIS XR 7.5-325 MG TBCR Take 1 tablet by mouth 2 (two) times daily as needed.        Psychiatric Specialty Exam:     Blood pressure 134/69, pulse 61, temperature 98.6 F (37 C), temperature source Oral, resp. rate 18, SpO2 100.00%.There is no weight on file to calculate BMI.  General Appearance: Casual  Eye Contact::  Good  Speech:  Clear and Coherent and Normal Rate  Volume:  Normal  Mood:  Anxious  Affect:  Congruent  Thought Process:  Circumstantial and Goal Directed  Orientation:  Full (Time, Place, and Person)  Thought Content:  Rumination  Suicidal Thoughts:  No  Homicidal Thoughts:  No  Memory:  Immediate;   Good Recent;   Good Remote;   Good  Judgement:  Fair  Insight:  Fair  Psychomotor Activity:  Tremor  Concentration:  Fair  Recall:  Good  Fund of Knowledge:Good  Language: Good  Akathisia:  No  Handed:  Right  AIMS (if indicated):     Assets:  Communication Skills Desire for Improvement  Sleep:      Musculoskeletal: Strength & Muscle Tone: within normal limits Gait & Station: normal Patient leans: N/A  Treatment Plan Summary: 24 hour observation Discharge tomorrow morning  Rankin, Shuvon , FNP-BC  09/20/2013 3:12 PM

## 2013-09-20 NOTE — ED Notes (Signed)
Patient on phone at this time 

## 2013-09-20 NOTE — ED Provider Notes (Signed)
Medical screening examination/treatment/procedure(s) were performed by non-physician practitioner and as supervising physician I was immediately available for consultation/collaboration.   EKG Interpretation None       Ezequiel Essex, MD 09/20/13 [phone]

## 2013-09-20 NOTE — ED Notes (Signed)
Notified RN,Kellee pt. CBG 173.

## 2013-09-20 NOTE — ED Notes (Signed)
Patient had a  pleasant visit with family member. No acute distress noted.

## 2013-09-20 NOTE — ED Notes (Signed)
EKG given to EDP, Harrison,MD. For review.

## 2013-09-20 NOTE — ED Provider Notes (Addendum)
9:33 PM  Contacted by nurse in psych ED.  she reports patient is complaining of shortness of breath. She states that he has had some bradycardia. He does appear to be on carvedilol but in the past 24 hours his heart rate has been in the 60s to 90s. Have instructed her to send him back over to the main ED where he will put him on a cardiac monitor, obtain an EKG, repeat labs, chest x-ray and reevaluate the patient.   10:37 PM  Pt ports that he is only short of breath when lying flat. He has diminished aeration of his bases bilaterally. He is in atrial fibrillation but his rate controlled. He denies a history of atrial fibrillation. He is not on anticoagulation. Troponin negative.  CHADS2 is 2.   11:03 PM  Pt's chest x-ray does not show no pulmonary edema. His BNP is less than 500. His potassium and magnesium are normal. He does have a slightly low bicarbonate which may be secondary to alcohol abuse. We'll give IV fluids. Troponin negative. Discussed with Dr. Aundra Dubin with cardiology who recommends continuing carvedilol and in a controlled setting patient can be started on anticoagulation. He recommends starting patient on Xarelto 20 mg daily. Patient however is going to continue to drink and is at risk for falls and head injury, this medication may be dangerous and should likely be held. He states the patient will need an echocardiogram as an outpatient. It does not need to be done emergently.  I feel his a fib is also likely the reason his pulse oximeter was picking up that his heart rate was in the 20s while in the psych ED and that this was not ral. I feel after his IV fluids, patient is safe to go back to psych holding area.    Discussed this all with the patient she understands. He states he is not going to drink alcohol and would like to be discharged on Xarelto for anticoagulation. He understands he needs to followup with cardiology for an outpatient echocardiogram.    EKG  Interpretation  Date/Time:  Wednesday September 20 2013 21:53:45 EDT Ventricular Rate:  79 PR Interval:    QRS Duration: 109 QT Interval:  387 QTC Calculation: 444 R Axis:   97 Text Interpretation:  Atrial fibrillation Borderline right axis deviation Abnormal R-wave progression, late transition Confirmed by WARD,  DO, KRISTEN [phone]) on 09/20/2013 10:37:30 PM        Delice Bison Ward, DO 09/20/13 [place], DO 09/20/13 2317

## 2013-09-20 NOTE — BH Assessment (Addendum)
Assessment Note  [name] [name] is an 58 y.o. male with hx of schizophrenia and substance abuse presents to [facility] Emergency Department. Patient was brought to the Emergency Department by his sister. Patient sts that he wants help with his withdrawal symptoms and to stop drinking. Patient started drinking alcohol at the age of 70. He drinks daily. Patient drinking a fifth and 1/2 of liqour per day for the past 1 and 1/2 weeks. His alcoholic beverage was [id number] @ 4pm. Patient reports current withdrawal symptoms of nausea/vomiting, diarrhea, fatigue, irritation, tremors, hot/cold flashes, and sweats. Patient denies hx of seizures. He reports a hx of DT's. No family hx of substance abuse. Patient has participated in inpatient treatment at a "facility in [place]" and [facility] approx. 17-19 yrs ago. Patient's longest period of sobriety is 2 yrs. Negative consequences of use include issues with personal relationships, finances, and hx of legal issues. Although, patient has no current legal problems alcohol has caused his to have trespassing and disorderly legal charges in the past. Patient has a hx of cocaine abuse.   Patient denies SI, HI, and AVH's. No associated hx. No hx of self mutilating. Patient does admit to feeling depressed. Sts that he has loss of interest in usual pleasures, fatigue, irritability, anger, isolating self from others, and helplessness. Patient not sleeping or eating well. Sts that he loss 5-6 pounds in the past week. Stressors include medical issues. Patient stating that he had prostate cancer, which is intermission at this time. However, a previous surgery related to his prostate cancer has caused him to have issues sexual relationships. Patient also has to wear an adult brief due to "leakage problems". Patient reports self esteems issues.  No family hx of mental health illness. No hx of abuse. Patient has a lot of family support (sister(s), nieces, and brother).    Patient has a psychiatrist but unable to recall name of the the provider.   Axis I: Depressive Disorder NOS and Alcohol Dependence Axis II: Deferred Axis III:  Past Medical History  Diagnosis Date  . GERD (gastroesophageal reflux disease)   . Schizophrenia, schizo-affective   . Cataract   . Hyperlipidemia   . Wears glasses   . Wears dentures     upper  . Hypertension     uncontrolled with medication noncompliance  . Hepatitis C   . HIT (heparin-induced thrombocytopenia)   . Continuous chronic alcoholism   . H/O cocaine abuse     none in 6 months  . Stroke 01/2012    Small cerebellar infarcts right greater than left as well as questionable acute left external capsule and caudate nuclear punctate lacunar infarcts noted per MRI (01/2012) - presumed to be embolic likely source PFO with right to left shunt (noted per TEE 01/ 2014)  . Chronic combined systolic and diastolic CHF (congestive heart failure) 02/29/2012    LV EF 40-45% per 2D echo (02/2012) with grade 1 diastolic dysfunction. 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 Degenerative lumbar spinal stenosis     s/p L2-3, L3-4, L4-5 laminectomy partial facetectomy, and bilateral foraminotomy  . History of pancreatitis 01/2011    Admission for acute pancreatitis presumed 2/2 ongoing alcohol abuse- and hypertriglyceridemia  . PFO (patent foramen ovale) 01/2012    with right to left shunt, noted per TEE in evaluation for source of embolic stroke in [id number]  . CKD (chronic kidney disease) stage 2, GFR  60-89 ml/min     BL SCr approximately 1-1.3  . 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 Hepatic steatosis     suspected 2/2 alcohol abuse  . Splenic cyst   . Colitis 05/2009    History of colitis of ascending colon noted on CT abd/pelvis (05/2009), with interval resolution with subsequent CT  .  Hypertriglyceridemia   . Cancer     Prostate cancer-bx. 3 weeks ago  . History of ETOH abuse 12-13-12     no use in 6 months   Axis IV: other psychosocial or environmental problems, problems related to social environment, problems with access to health care services and problems with primary support group Axis V: 31-40 impairment in reality testing  Past Medical History:  Past Medical History  Diagnosis Date  . GERD (gastroesophageal reflux disease)   . Schizophrenia, schizo-affective   . Cataract   . Hyperlipidemia   . Wears glasses   . Wears dentures     upper  . Hypertension     uncontrolled with medication noncompliance  . Hepatitis C   . HIT (heparin-induced thrombocytopenia)   . Continuous chronic alcoholism   . H/O cocaine abuse     none in 6 months  . Stroke 01/2012    Small cerebellar infarcts right greater than left as well as questionable acute left external capsule and caudate nuclear punctate lacunar infarcts noted per MRI (01/2012) - presumed to be embolic likely source PFO with right to left shunt (noted per TEE 01/ 2014)  . Chronic combined systolic and diastolic CHF (congestive heart failure) 02/29/2012    LV EF 40-45% per 2D echo (02/2012) with grade 1 diastolic dysfunction. 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 Degenerative lumbar spinal stenosis     s/p L2-3, L3-4, L4-5 laminectomy partial facetectomy, and bilateral foraminotomy  . History of pancreatitis 01/2011    Admission for acute pancreatitis presumed 2/2 ongoing alcohol abuse- and hypertriglyceridemia  . PFO (patent foramen ovale) 01/2012    with right to left shunt, noted per TEE in evaluation for source of embolic stroke in [id number]  . CKD (chronic kidney disease) stage 2, GFR 60-89 ml/min     BL SCr approximately 1-1.3  . 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 Hepatic  steatosis     suspected 2/2 alcohol abuse  . Splenic cyst   . Colitis 05/2009    History of colitis of ascending colon noted on CT abd/pelvis (05/2009), with interval resolution with subsequent CT  . Hypertriglyceridemia   . Cancer     Prostate cancer-bx. 3 weeks ago  . History of ETOH abuse 12-13-12     no use in 6 months    Past Surgical History  Procedure Laterality Date  . L2-3, l3-4, l4-5 laminectomy, partial facetectomy    . Left achilles Right 2007  . Tonsillectomy    . Knee arthroscopy    . I&d extremity  03/20/2011    Procedure: IRRIGATION AND DEBRIDEMENT EXTREMITY;  Surgeon: Kerin Salen, MD;  Location: [place];  Service: Orthopedics;  Laterality: Left;  I&D LEFT ACHILLIES TENDON  . Eye surgery      right eye  . Resection distal clavical  09/17/2011    Procedure: RESECTION DISTAL CLAVICAL;  Surgeon: Nita Sells, MD;  Location: [facility];  Service: Orthopedics;  Laterality: Right;  right shoulder arthroscopy with sad and open distal clavicle excision   . Esophagogastroduodenoscopy N/A 06/25/2012    Procedure: ESOPHAGOGASTRODUODENOSCOPY (EGD);  Surgeon: Milus Banister, MD;  Location: [place];  Service: Endoscopy;  Laterality: N/A;  . Robot assisted laparoscopic radical prostatectomy N/A 12/16/2012    Procedure: ROBOTIC ASSISTED LAPAROSCOPIC PROSTATECTOMY ;  Surgeon: Ardis Hughs, MD;  Location: [facility];  Service: Urology;  Laterality: N/A;    Family History:  Family History  Problem Relation Age of Onset  . CAD Mother 6  . CAD Sister   . CAD Brother 70    died from MI at age 4yo  . Hypertension      Social History:  reports that he quit smoking about 12 years ago. His smoking use included Cigarettes. He has a 15 pack-year smoking history. He has never used smokeless tobacco. He reports that he drinks alcohol. He reports that he does not use illicit drugs.  Additional Social History:  Alcohol / Drug Use Pain Medications: SEE  MAR Prescriptions: SEE MAR Over the Counter: Pain pills for back and body pains History of alcohol / drug use?: Yes Longest period of sobriety (when/how long): 2 yrs  Negative Consequences of Use: Financial;Legal;Personal relationships Withdrawal Symptoms: Tremors;Sweats;Diarrhea;Agitation;Blackouts;Fever / Chills;Tingling;Patient aware of relationship between substance abuse and physical/medical complications;Nausea / Vomiting;Weakness;Irritability;DTs;Cramps;Change in blood pressure Substance #1 Name of Substance 1: Alcohol  1 - Age of First Use: 58 yrs old  1 - Amount (size/oz): "5th and a 1/2" 1 - Frequency: daily  1 - Duration: "Week and a 1/2" 1 - Last Use / Amount: 09/19/2013 @ 4pm  CIWA: CIWA-Ar BP: 134/69 mmHg Pulse Rate: 61 Nausea and Vomiting: no nausea and no vomiting Tactile Disturbances: none Tremor: three Auditory Disturbances: not present Paroxysmal Sweats: no sweat visible Visual Disturbances: not present Anxiety: three Headache, Fullness in Head: moderate Agitation: normal activity Orientation and Clouding of Sensorium: oriented and can do serial additions CIWA-Ar Total: 9 COWS:    Allergies:  Allergies  Allergen Reactions  . Heparin Other (See Comments)    Documented HIT under problem list Pt states he's not allergic.  Marland Kitchen Thorazine [Chlorpromazine Hcl] Other (See Comments)    Body freezes up    Home Medications:  (Not in a hospital admission)  OB/GYN Status:  No LMP for male patient.  General Assessment Data Location of Assessment: [facility] Is this a Tele or Face-to-Face Assessment?: Face-to-Face Is this an Initial Assessment or a Re-assessment for this encounter?: Initial Assessment Living Arrangements: Other (Comment) (lives in a apartment alone; has a aid that helps me) Can pt return to current living arrangement?: Yes Admission Status: Voluntary Is patient capable of signing voluntary admission?: Yes Transfer from: [facility] Referral  Source: Self/Family/Friend     [place] Living Arrangements: Other (Comment) (lives in a apartment alone; has a aid that helps me) Name of Psychiatrist:  (has psychiatrist but unable to recall name) Name of Therapist:  (none reported )  Education Status Is patient currently in school?: Yes Current Grade:  (0) Highest grade of school patient has completed:  (12th grade )  Risk to self with the past 6 months Suicidal Ideation: No Suicidal Intent: No Is patient at risk for suicide?: No Suicidal Plan?: No Access to Means: No What has been your use of drugs/alcohol within the last 12 months?:  (alcohol ) Previous Attempts/Gestures: No How many times?:  (03) Other Self Harm  Risks:  (none reported ) Triggers for Past Attempts: Other (Comment) (none reported ) Intentional Self Injurious Behavior: None Family Suicide History: No Recent stressful life event(s): Other (Comment) ("operation for cancer & I have to wear pull-ups";sexual px's) Persecutory voices/beliefs?: No Depression: Yes Depression Symptoms: Feeling angry/irritable;Loss of interest in usual pleasures;Feeling worthless/self pity;Guilt;Fatigue;Isolating;Tearfulness;Insomnia;Despondent Substance abuse history and/or treatment for substance abuse?: Yes Suicide prevention information given to non-admitted patients: Not applicable  Risk to Others within the past 6 months Homicidal Ideation: No Thoughts of Harm to Others: No Current Homicidal Intent: No Current Homicidal Plan: No Access to Homicidal Means: No Identified Victim:  (n/a) History of harm to others?: No Assessment of Violence: None Noted Violent Behavior Description:  (patient calm and cooperative ) Does patient have access to weapons?: No Criminal Charges Pending?: No Describe Pending Criminal Charges:  (no current; past hx of trespassing & disorderly conduct ) Does patient have a court date: No  Psychosis Hallucinations: None noted Delusions:  None noted  Mental Status Report Appear/Hygiene: Disheveled Eye Contact: Good Motor Activity: Freedom of movement Speech: Logical/coherent Level of Consciousness: Alert;Drowsy Mood: Depressed Affect: Appropriate to circumstance Anxiety Level: None Thought Processes: Coherent Judgement: Unimpaired Orientation: Person;Situation;Time;Place Obsessive Compulsive Thoughts/Behaviors: None  Cognitive Functioning Concentration: Decreased Memory: Recent Intact;Remote Intact IQ: Average Insight: Fair Impulse Control: Fair Appetite: Fair Weight Loss:  (pt reports 5-6 pounds in the past 10 days ) Weight Gain:  (none reported ) Sleep: No Change Total Hours of Sleep:  (varies ) Vegetative Symptoms: None  ADLScreening [facility] Assessment Services) Patient's cognitive ability adequate to safely complete daily activities?: Yes Patient able to express need for assistance with ADLs?: Yes Independently performs ADLs?: Yes (appropriate for developmental age)  Prior Inpatient Therapy Prior Inpatient Therapy: Yes Prior Therapy Dates:  ([place]- yrs and "facility in [place]"-19 yrs ago) Prior Therapy Facilty/Provider(s):  [facility] and "facility in [place]") Reason for Treatment:  (detox and substance abuse )  Prior Outpatient Therapy Prior Outpatient Therapy: Yes Prior Therapy Dates:  (current ) Prior Therapy Facilty/Provider(s):  (psychiatrist-pt unable to recall name of provider) Reason for Treatment:  (medication managment )  ADL Screening (condition at time of admission) Patient's cognitive ability adequate to safely complete daily activities?: Yes Is the patient deaf or have difficulty hearing?: No Does the patient have difficulty seeing, even when wearing glasses/contacts?: No Does the patient have difficulty concentrating, remembering, or making decisions?:  (Patient has a Aid that comes to the home and helps 5 days a week (cooking and cleaning). ) Patient able to express need for  assistance with ADLs?: Yes Does the patient have difficulty dressing or bathing?: No Independently performs ADLs?: Yes (appropriate for developmental age) Does the patient have difficulty walking or climbing stairs?: No Weakness of Legs: None Weakness of Arms/Hands: None  Home Assistive Devices/Equipment Home Assistive Devices/Equipment: None    Abuse/Neglect Assessment (Assessment to be complete while patient is alone) Physical Abuse: Denies Verbal Abuse: Denies Sexual Abuse: Denies Exploitation of patient/patient's resources: Denies Self-Neglect: Denies Values / Beliefs Cultural Requests During Hospitalization: None Spiritual Requests During Hospitalization: None   Advance Directives (For Healthcare) Does patient have an advance directive?: No Nutrition Screen- [place] [facility] Patient's home diet: Regular  Additional Information 1:1 In Past 12 Months?: No CIRT Risk: No Elopement Risk: No Does patient have medical clearance?: Yes     Disposition:  Disposition Initial Assessment Completed for this Encounter: Yes  On Site Evaluation by:   Reviewed with Physician:    Waldon Merl  [facility] 09/20/2013 9:16 AM

## 2013-09-20 NOTE — Progress Notes (Signed)
Pt's referral has been faxed to the following facilities:  [facility] [place]- per [facility] can fax [facility]- per [place] beds available [facility]- per Clair Gulling can fax [place]- per [place] can fax  [facility]: [place]- per Jerald Kief- per [facility]- per Clotilde Dieter- per [facility]- per [facility]- per [facility]- per [facility]- per [facility]- per [place]  **referral previously faxed to [facility] and [place] [facility]

## 2013-09-20 NOTE — ED Notes (Signed)
MD at bedside. 

## 2013-09-20 NOTE — ED Notes (Signed)
Bed: WA21 Expected date:  Expected time:  Means of arrival:  Comments: For room 41

## 2013-09-20 NOTE — ED Notes (Signed)
Attempting call Charge Nurse to move patient to acute side unable to reach at this time.

## 2013-09-20 NOTE — ED Notes (Signed)
Patient complains of SOB at this time. Respirations equal and mildly labored. Skin warm and dry. Will obtain vital signs and inform MD. Will continue to monitor patient.

## 2013-09-20 NOTE — ED Notes (Signed)
Pt. Ambulated down the hall and back to his room on 96% room air without difficulty. Pt. Gait steady on his feet.

## 2013-09-21 ENCOUNTER — Telehealth (HOSPITAL_COMMUNITY): Payer: Self-pay | Admitting: Vascular Surgery

## 2013-09-21 ENCOUNTER — Other Ambulatory Visit (HOSPITAL_COMMUNITY): Payer: Self-pay

## 2013-09-21 DIAGNOSIS — F1020 Alcohol dependence, uncomplicated: Secondary | ICD-10-CM

## 2013-09-21 LAB — CBG MONITORING, ED: Glucose-Capillary: 112 mg/dL — ABNORMAL HIGH (ref 70–99)

## 2013-09-21 MED ORDER — RIVAROXABAN 20 MG PO TABS: 20.0000 mg | ORAL_TABLET | Freq: Every day | ORAL | Status: DC

## 2013-09-21 NOTE — ED Notes (Signed)
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

## 2013-09-21 NOTE — ED Notes (Signed)
Up to the bathroom 

## 2013-09-21 NOTE — Consult Note (Signed)
  Psychiatric Specialty Exam: Physical Exam  ROS  Blood pressure 115/58, pulse 72, temperature 97.8 F (36.6 C), temperature source Oral, resp. rate 16, SpO2 100.00%.There is no weight on file to calculate BMI.  General Appearance: Fairly Groomed  Engineer, water::  Good  Speech:  Clear and Coherent  Volume:  Normal  Mood:  Anxious  Affect:  Appropriate  Thought Process:  Coherent and Logical  Orientation:  Full (Time, Place, and Person)  Thought Content:  Negative  Suicidal Thoughts:  No  Homicidal Thoughts:  No  Memory:  Immediate;   Good Recent;   Good Remote;   Good  Judgement:  Intact  Insight:  Fair  Psychomotor Activity:  Normal  Concentration:  Good  Recall:  Good  Akathisia:  Negative  Handed:  Right  AIMS (if indicated):     Assets:  Communication Skills Desire for Improvement  Sleep:   adequate  [name] [name] is handling withdrawal without problem.  He has displayed atrial fibrillation which has been evaluated by the ER MD and treated.  He will be discharged from psychiatry and if cleared by ER MD will be discharged home today to be followed by cardiology and will be given referrals for alcohol rehab.

## 2013-09-21 NOTE — ED Notes (Addendum)
Pt sitting quielty, reports back pain has improved.  Pulse irregular 32-96 bPm by auto, sats 96% RA.  Pt reports that he has had intermittant episodes of SOB, chest pain, diaphoresis for the past 4-5 days (none currently since last night),   Dr Reather Converse updated, repeat EKG

## 2013-09-21 NOTE — BH Assessment (Signed)
Per Dr. Lovena Le, discharge with outpatient referrals. Patient is psychiatrically cleared. Writer provided patient with a list of referrals.

## 2013-09-21 NOTE — ED Notes (Signed)
Patient ambulatory to room 41 with a steady gait escorted by Lambert Keto, Therapist, sports.

## 2013-09-21 NOTE — ED Notes (Signed)
Bed: [facility] Expected date:  Expected time:  Means of arrival:  Comments: Hold for room 21

## 2013-09-21 NOTE — ED Notes (Signed)
TTS into see 

## 2013-09-21 NOTE — BHH Suicide Risk Assessment (Signed)
Suicide Risk Assessment  Discharge Assessment     Demographic Factors:  Male, Low socioeconomic status, Living alone and Unemployed  Total Time spent with patient: 30 minutes  Psychiatric Specialty Exam:     Blood pressure 115/58, pulse 72, temperature 97.8 F (36.6 C), temperature source Oral, resp. rate 16, SpO2 100.00%.There is no weight on file to calculate BMI.  General Appearance: Casual  Eye Contact::  Good  Speech:  Clear and Coherent  Volume:  Normal  Mood:  Anxious  Affect:  Appropriate  Thought Process:  Coherent and Logical  Orientation:  Full (Time, Place, and Person)  Thought Content:  Negative  Suicidal Thoughts:  No  Homicidal Thoughts:  No  Memory:  Immediate;   Good Recent;   Good Remote;   Good  Judgement:  Good  Insight:  Fair  Psychomotor Activity:  Normal  Concentration:  Good  Recall:  Good  Fund of Knowledge:Good  Language: Good  Akathisia:  Negative  Handed:  Right  AIMS (if indicated):     Assets:  Communication Skills Desire for Improvement  Sleep:       Musculoskeletal: Strength & Muscle Tone: within normal limits Gait & Station: normal Patient leans: N/A   Mental Status Per Nursing Assessment::   On Admission:     Current Mental Status by Physician: NA  Loss Factors: NA  Historical Factors: NA  Risk Reduction Factors:   NA  Continued Clinical Symptoms:  Alcohol/Substance Abuse/Dependencies  Cognitive Features That Contribute To Risk:  none    Suicide Risk:  Minimal: No identifiable suicidal ideation.  Patients presenting with no risk factors but with morbid ruminations; may be classified as minimal risk based on the severity of the depressive symptoms  Discharge Diagnoses:   AXIS I:  alcohol dependence AXIS II:  Deferred AXIS III:   Past Medical History  Diagnosis Date  . GERD (gastroesophageal reflux disease)   . Schizophrenia, schizo-affective   . Cataract   . Hyperlipidemia   . Wears glasses   . Wears  dentures     upper  . Hypertension     uncontrolled with medication noncompliance  . Hepatitis C   . HIT (heparin-induced thrombocytopenia)   . Continuous chronic alcoholism   . H/O cocaine abuse     none in 6 months  . Stroke 01/2012    Small cerebellar infarcts right greater than left as well as questionable acute left external capsule and caudate nuclear punctate lacunar infarcts noted per MRI (01/2012) - presumed to be embolic likely source PFO with right to left shunt (noted per TEE 01/ 2014)  . Chronic combined systolic and diastolic CHF (congestive heart failure) 02/29/2012    LV EF 40-45% per 2D echo (02/2012) with grade 1 diastolic dysfunction. 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 Degenerative lumbar spinal stenosis     s/p L2-3, L3-4, L4-5 laminectomy partial facetectomy, and bilateral foraminotomy  . History of pancreatitis 01/2011    Admission for acute pancreatitis presumed 2/2 ongoing alcohol abuse- and hypertriglyceridemia  . PFO (patent foramen ovale) 01/2012    with right to left shunt, noted per TEE in evaluation for source of embolic stroke in [id number]  . CKD (chronic kidney disease) stage 2, GFR 60-89 ml/min     BL SCr approximately 1-1.3  . 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 Hepatic steatosis  suspected 2/2 alcohol abuse  . Splenic cyst   . Colitis 05/2009    History of colitis of ascending colon noted on CT abd/pelvis (05/2009), with interval resolution with subsequent CT  . Hypertriglyceridemia   . Cancer     Prostate cancer-bx. 3 weeks ago  . History of ETOH abuse 12-13-12     no use in 6 months   AXIS IV:  chronic addiction AXIS V:  61-70 mild symptoms  Plan Of Care/Follow-up recommendations:  Activity:  resume usual activity Diet:  resume usual diet  Is patient on multiple antipsychotic therapies at discharge:  No    Has Patient had three or more failed trials of antipsychotic monotherapy by history:  No  Recommended Plan for Multiple Antipsychotic Therapies: NA    [name] [name] D 09/21/2013, 11:11 AM

## 2013-09-21 NOTE — Consult Note (Signed)
Face to face evaluation and I agree with this note 

## 2013-09-21 NOTE — ED Provider Notes (Signed)
Contacted by psychiatric nurse for heart rate ranging between 30s and low 100s. Patient recently diagnosed with atrial fibrillation and started on Xarelto. Patient has no symptoms with heart rate changes, no chest pain, no shortness of breath, no palpitations.  Patient is being followed by psychiatry right now. EKG repeat reviewed showing heart rate 108, normal QT, regular atrial fibrillation, atrial fibrillation seen on previous. Discussed giving carvedilol and monitoring continued.  [name] [name]   [name] Clonts, MD 09/21/13 [phone]

## 2013-09-21 NOTE — ED Notes (Signed)
EKG reviewed by dr Coral Spikes Carvedilol per Dr Earnest Conroy. And monitor

## 2013-09-21 NOTE — ED Notes (Addendum)
S/s of bleeding reviewed w/ patient, precautions needed for cuts, bruising, falls, etc...while taking xarelto reviewed w/ patient.  Pt verbalized understanding.

## 2013-09-21 NOTE — ED Notes (Signed)
Up on the phone 

## 2013-09-21 NOTE — ED Notes (Addendum)
Written dc instructions reviewed w/ patient.  Pt encouraged to contact his cardiologist today for follow up as soon as possible, follow up w/ OP treatment for alcohol use,  to take his medication as directed, avoid alcohol, and return/call 911 for return of chest pain/short of breath/sweatyness/dizzyness or other concerns.  Pt verbalized understanding and reported he would do so. Pt ambulatory w/o difficulty to dc window w/ mHt, belongings returned after leaving the unit.  Pt's sister to pick him up.

## 2013-09-21 NOTE — ED Notes (Addendum)
Written Pt education information about A-fib and xarelto given to patient

## 2013-09-21 NOTE — ED Notes (Signed)
Pt reports that he has a home health aide that comes in 5 days a week to assist him.

## 2013-09-21 NOTE — Telephone Encounter (Signed)
Patient and sister state xarelto is not covered under patient's insurance.  Patient has Medicaid and [employer], and per pharmacy [facility]) this is covered but they did not receive Rx for the medication.  Informed patient of misunderstanding, Rx sent to pharmacy. Renee Pain

## 2013-09-21 NOTE — Telephone Encounter (Signed)
Pt was just D/c from [facility].. The doctor prescribed a blood thinner Xarelto  His insurance does not cover the blood thinner.. Please advise

## 2013-09-21 NOTE — ED Notes (Addendum)
Dr Reather Converse aware that pt has been dc'd from psych, [place] to dc pt home per dr Reather Converse.

## 2013-09-21 NOTE — ED Notes (Addendum)
Pharmacy [facility]) contacted concerning heparin allergy and xarelto, OK to give xarelto

## 2013-09-21 NOTE — ED Notes (Signed)
Pt's sister Chrys Racer) called (verbal permission from Pt to give information).  She reports that he has not been taking his schizophrenic meds and has been drinking x3 weeks.  She reports that when he is off his medication hears voices and drinks to stop the voices.  She is also aware of his new medication (xarelto) and that he needs to follow up with his cardiologist.

## 2013-09-25 ENCOUNTER — Telehealth (HOSPITAL_COMMUNITY): Payer: Self-pay | Admitting: *Deleted

## 2013-09-25 NOTE — Telephone Encounter (Signed)
Received form from [place] that Xarelto needs PA, called [employer] [facility] at [phone] (pt's ID [id number]) med was approved through 09/26/14 authorization # [id number], [facility] aware

## 2013-09-29 ENCOUNTER — Inpatient Hospital Stay (HOSPITAL_COMMUNITY): Payer: PRIVATE HEALTH INSURANCE

## 2013-09-29 ENCOUNTER — Encounter (HOSPITAL_COMMUNITY): Payer: Self-pay | Admitting: Anesthesiology

## 2013-10-03 ENCOUNTER — Ambulatory Visit (HOSPITAL_COMMUNITY)
Admission: RE | Admit: 2013-10-03 | Discharge: 2013-10-03 | Disposition: A | Discharge status: Home / Self Care | Payer: PRIVATE HEALTH INSURANCE | Source: Ambulatory Visit | Attending: Internal Medicine | Admitting: Internal Medicine

## 2013-10-03 ENCOUNTER — Encounter (HOSPITAL_COMMUNITY): Payer: Self-pay

## 2013-10-03 VITALS — BP 140/92 | HR 71 | Wt 280.2 lb

## 2013-10-03 DIAGNOSIS — F1010 Alcohol abuse, uncomplicated: Secondary | ICD-10-CM | POA: Diagnosis not present

## 2013-10-03 DIAGNOSIS — I509 Heart failure, unspecified: Secondary | ICD-10-CM | POA: Insufficient documentation

## 2013-10-03 DIAGNOSIS — I10 Essential (primary) hypertension: Secondary | ICD-10-CM | POA: Insufficient documentation

## 2013-10-03 DIAGNOSIS — Z794 Long term (current) use of insulin: Secondary | ICD-10-CM | POA: Diagnosis not present

## 2013-10-03 DIAGNOSIS — I5042 Chronic combined systolic (congestive) and diastolic (congestive) heart failure: Secondary | ICD-10-CM | POA: Insufficient documentation

## 2013-10-03 DIAGNOSIS — F1411 Cocaine abuse, in remission: Secondary | ICD-10-CM | POA: Insufficient documentation

## 2013-10-03 DIAGNOSIS — Z87891 Personal history of nicotine dependence: Secondary | ICD-10-CM | POA: Diagnosis not present

## 2013-10-03 DIAGNOSIS — I4891 Unspecified atrial fibrillation: Secondary | ICD-10-CM | POA: Diagnosis not present

## 2013-10-03 DIAGNOSIS — E119 Type 2 diabetes mellitus without complications: Secondary | ICD-10-CM | POA: Insufficient documentation

## 2013-10-03 DIAGNOSIS — I480 Paroxysmal atrial fibrillation: Secondary | ICD-10-CM

## 2013-10-03 HISTORY — DX: Unspecified atrial fibrillation: I48.91

## 2013-10-03 LAB — BASIC METABOLIC PANEL
Anion gap: 10 (ref 5–15)
BUN: 18 mg/dL (ref 6–23)
CO2: 27 mEq/L (ref 19–32)
CREATININE: 1.04 mg/dL (ref 0.50–1.35)
Calcium: 9.7 mg/dL (ref 8.4–10.5)
Chloride: 106 mEq/L (ref 96–112)
GFR, EST AFRICAN AMERICAN: 89 mL/min — AB (ref >90)
GFR, EST NON AFRICAN AMERICAN: 77 mL/min — AB (ref >90)
Glucose, Bld: 94 mg/dL (ref 70–99)
POTASSIUM: 4.4 meq/L (ref 3.7–5.3)
Sodium: 143 mEq/L (ref 137–147)

## 2013-10-03 MED ORDER — SPIRONOLACTONE 25 MG PO TABS: 12.5000 mg | ORAL_TABLET | Freq: Every day | ORAL | Status: DC

## 2013-10-03 MED ORDER — CLONIDINE HCL 0.1 MG PO TABS: 0.1000 mg | ORAL_TABLET | Freq: Every day | ORAL | Status: DC

## 2013-10-03 MED ORDER — POTASSIUM CHLORIDE ER 10 MEQ PO TBCR: EXTENDED_RELEASE_TABLET | ORAL | Status: DC

## 2013-10-03 NOTE — Progress Notes (Addendum)
Patient ID: [name] [name], male   DOB: 31-Dec-1955, 58 y.o.   MRN: [id number]  Primary Care: Internal Medicine clinic, Dr. Silverio Decamp Primary Cardiologist: Dr. Doylene Canard  GI: Dr Ardis Hughs.   HPI: [name] [name] is a 19 y.o. 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 Had history of acute renal failure in setting of rhabdo requiring short term HD.    Admitted with new onset HF in April 2014.   He was diuresed and discharged home.  Discharge weight 256 pounds. Echo at the time showed EF 40-81%, grade 1 diastolic dysfunction.    Admitted to [facility] 5/30 through 06/27/12 with GI bleed. EGD 06/25/12 multiple ulcers noted. Continued on protonix.   ECHO 10/27/12 EF 40-45%   Follow up for Heart Failure: Patient recently seen in the ED for alcohol withdrawal and hallucinations. He was also recently started on Xarelto for newly diagnosed atrial fibrillation. Has not drank since Sep 23, 2013. Overall feeling ok. Denies SOB, CP, PND or orthopnea. Sometimes sleeps in recliner d/t back pain. Able to walk about 2 blocks before getting SOB. Able to go upstairs. Weight at home 280 lbs. Trying to follow low salt diet and drinking less than 2L a day. Complaint with medications and brought to visit. No bleeding issues.     Labs 02/12/13 K 4.3 Creatinine 0.98          08/2013: K 3.9, creatinine 1.27, Mag 1.8, pro-BNP 392, Troponin 0.05  SH: Quit smoking 9 years ago. Lives alone. Quit ETOH 09-23-13 FH: Mom died from MI in her early 79s  Brother MI and deceased. Does not know about fathers health history.    Review of Systems: All pertinent positives and negatives as in HPI, otherwise negative.    Past Medical History  Diagnosis Date  . GERD (gastroesophageal reflux disease)   . Schizophrenia, schizo-affective   .  Cataract   . Hyperlipidemia   . Hypertension     uncontrolled with medication noncompliance  . Hepatitis C   . HIT (heparin-induced thrombocytopenia)   . Continuous chronic alcoholism   . H/O cocaine abuse   . Stroke 01/2012    Small cerebellar infarcts right greater than left as well as questionable acute left external capsule and caudate nuclear punctate lacunar infarcts noted per MRI (01/2012) - presumed to be embolic likely source PFO with right to left shunt (noted per TEE 01/ 2014)  . Chronic combined systolic and diastolic CHF (congestive heart failure)     a) EF 40-45% per 2D echo (02/2012) with grade 1 DD b)  NICM c) RHC (04/2012): RA: 4, RV 45/3/4, PA 42/9 (24), PCWP 14, Fick CO/CI: 5.2 /2.2, PVR 1.9 WU, PA 62% and 64% d) ECHO (10/2012) EF 40-45%, grade II DD, RV nl  . Degenerative lumbar spinal stenosis     s/p L2-3, L3-4, L4-5 laminectomy partial facetectomy, and bilateral foraminotomy  . History of pancreatitis 01/2011    Admission for acute pancreatitis presumed 2/2 ongoing alcohol abuse- and hypertriglyceridemia  . PFO (patent foramen ovale) 01/2012    with right to left shunt, noted per TEE in evaluation for source of embolic stroke in 04/4816  . CKD (chronic kidney disease) stage 2, GFR 60-89 ml/min     BL SCr approximately 1-1.3  . Rhabdomyolysis 02/22/2012    H/O rhabdomyolysis in 01/2012 that was  idiopathic, cause never identified  . Hepatic steatosis     suspected 2/2 alcohol abuse  . Splenic cyst   . Colitis 05/2009    History of colitis of ascending colon noted on CT abd/pelvis (05/2009), with interval resolution with subsequent CT  . Hypertriglyceridemia   . Cancer     Prostate cancer-bx. 3 weeks ago  . NICM (nonischemic cardiomyopathy)     a. LHC (04/2012): nl arteries  . Atrial fibrillation     Current Outpatient Prescriptions  Medication Sig Dispense Refill  . ACCU-CHEK FASTCLIX LANCETS MISC Use as instructed to test four times a day. DX: 250.92, insulin  requiring  204 each  2  . ARIPiprazole (ABILIFY) 20 MG tablet Take 20 mg by mouth at bedtime.      . Blood Glucose Monitoring Suppl (ACCU-CHEK NANO SMARTVIEW) W/DEVICE KIT Use as instructed to test four times a day. DX: 250.92, insulin requiring  1 kit  0  . carvedilol (COREG) 25 MG tablet Take 1 tablet (25 mg total) by mouth 2 (two) times daily with a meal.  60 tablet  6  . cloNIDine (CATAPRES) 0.1 MG tablet Take 1 tablet (0.1 mg total) by mouth 2 (two) times daily.  60 tablet  3  . glucose blood (ACCU-CHEK SMARTVIEW) test strip Use as instructed to test four times a day. DX: 250.92, insulin requiring  150 each  2  . insulin aspart (NOVOLOG) 100 UNIT/ML injection Inject 5 Units into the skin 3 (three) times daily with meals.  10 mL  11  . Insulin Detemir (LEVEMIR FLEXTOUCH) 100 UNIT/ML Pen Inject 40 Units into the skin daily at 10 pm.  15 mL  11  . Insulin Pen Needle (FIFTY50 PEN NEEDLES) 31G X 5 MM MISC Use to inject insulin up to 4 times daily. Insulin dependent. diag code 250.00  150 each  5  . lisinopril (PRINIVIL,ZESTRIL) 20 MG tablet Take 1 tablet (20 mg total) by mouth 2 (two) times daily.  60 tablet  3  . metolazone (ZAROXOLYN) 2.5 MG tablet Take 2.5 mg by mouth as needed (for fluid).       . pantoprazole (PROTONIX) 40 MG tablet Take 1 tablet (40 mg total) by mouth 2 (two) times daily.  60 tablet  2  . potassium chloride (K-DUR) 10 MEQ tablet Take 1 tablet (10 mEq total) by mouth 2 (two) times daily. Take extra 2 tabs when take Metolazone  70 tablet  3  . rivaroxaban (XARELTO) 20 MG TABS tablet Take 1 tablet (20 mg total) by mouth daily.  30 tablet  3  . rosuvastatin (CRESTOR) 20 MG tablet Take 20 mg by mouth daily.      Marland Kitchen torsemide (DEMADEX) 20 MG tablet Take 40 mg by mouth 2 (two) times daily.      . traZODone (DESYREL) 100 MG tablet Take 100 mg by mouth at bedtime.      Marland Kitchen XARTEMIS XR 7.5-325 MG TBCR Take 1 tablet by mouth 2 (two) times daily as needed.       No current  facility-administered medications for this encounter.    Allergies  Allergen Reactions  . Heparin Other (See Comments)    Documented HIT under problem list Pt states he's not allergic.  Marland Kitchen Thorazine [Chlorpromazine Hcl] Other (See Comments)    Body freezes up     Filed Vitals:   10/03/13 0828  BP: 140/92  Pulse: 71  Weight: 280 lb 3.2 oz (127.098 kg)  SpO2: 96%  PHYSICAL EXAM: General:  NAD. No respiratory difficulty   HEENT: normal Neck: supple. JVP difficult to see due to body habitus but does not appear elelvated. Carotids 2+ bilat; no bruits. No lymphadenopathy or thryomegaly appreciated. Cor: PMI nondisplaced. Regular rate & rhythm. No rubs, gallops, Very soft TR murmur. Lungs: clear Abdomen: soft, nontender, no distention. No hepatosplenomegaly. No bruits or masses. Good bowel sounds. Extremities: no cyanosis, clubbing, rash, bilateral trace edema Neuro: alert & oriented x 3, cranial nerves grossly intact. moves all 4 extremities w/o difficulty. Flat Affect  EKG: SB 59 bpm  ASSESSMENT /PLAN  1) Chronic combined systolic/diastolic HF: NICM, EF 20-81%, grade II DD (10/2012)  - Recently discharged from the ED for ETOH withdrawal and hallucinations. - NYHA II symptoms and volume status stable. Will continue torsemide 40 gm BID and metolazone PRN when instructed by the clinic.  - Coreg at goal dose 25 mg BID and lisinopril at goal dose 40 mg daily.  - SBP remains elevated and would like to eventually get him off clonidine. Will start spironolactone 12.5 mg daily and check BMET in 7-10 days along with BP. Will decrease clonidine to 0.1 mg daily. - Decrease potassium to 10 meq (1 tablet) daily.  - Patient cancelled sleep study in the past and reports he thinks he snores quite a bit. Will refer back to have sleep study. - Reinforced the need and importance of daily weights, a low sodium diet, and fluid restriction (less than 2 L a day). Instructed to call the HF clinic if  weight increases more than 3 lbs overnight or 5 lbs in a week.  2) HTN: - remains slightly elevated. As above will decrease clonidine to 0.1 mg daily and start spironolactone 12.5 mg daily. Check BMET in 7-10 days.  3) PAF: - Is in SB this am 59 bpm. Will continue coreg 25 mg BID and xarelto 20 mg daily.  4) ETOH abuse - Patient has not drank since 09/19/13. Congratulated patient on success and encouraged him to abstain.    F/U 1 week for labs and in 1 month for clinic Junie Bame B NP-C  8:35 AM

## 2013-10-03 NOTE — Addendum Note (Signed)
Encounter addended by: Rande Brunt, NP on: 10/03/2013  3:21 PM<BR>     Documentation filed: Notes Section

## 2013-10-03 NOTE — Patient Instructions (Signed)
Doing great.  Decrease your clonidine to 0.1 mg (1 tablet) daily.  Decrease your potassium to 10 meq (1 tablet) daily and only take 1 extra tablet when you take metolazone.  Start spironolactone 12.5 mg (1/2 tablet) daily.  Come by next Tuesday on 9/15 to get blood work checked and blood pressure.  Follow up in 1 month.  Do the following things EVERYDAY: 1) Weigh yourself in the morning before breakfast. Write it down and keep it in a log. 2) Take your medicines as prescribed 3) Eat low salt foods-Limit salt (sodium) to 2000 mg per day.  4) Stay as active as you can everyday 5) Limit all fluids for the day to less than 2 liters 6)

## 2013-10-10 ENCOUNTER — Encounter (HOSPITAL_COMMUNITY): Payer: Self-pay

## 2013-10-10 ENCOUNTER — Ambulatory Visit (HOSPITAL_COMMUNITY)
Admission: RE | Admit: 2013-10-10 | Discharge: 2013-10-10 | Disposition: A | Discharge status: Home / Self Care | Payer: PRIVATE HEALTH INSURANCE | Source: Ambulatory Visit | Attending: Cardiology | Admitting: Cardiology

## 2013-10-10 VITALS — BP 130/86

## 2013-10-10 DIAGNOSIS — I5022 Chronic systolic (congestive) heart failure: Secondary | ICD-10-CM

## 2013-10-10 DIAGNOSIS — I4891 Unspecified atrial fibrillation: Secondary | ICD-10-CM | POA: Insufficient documentation

## 2013-10-10 DIAGNOSIS — I480 Paroxysmal atrial fibrillation: Secondary | ICD-10-CM

## 2013-10-10 LAB — BASIC METABOLIC PANEL
Anion gap: 12 (ref 5–15)
BUN: 12 mg/dL (ref 6–23)
CO2: 23 mEq/L (ref 19–32)
CREATININE: 0.97 mg/dL (ref 0.50–1.35)
Calcium: 9.8 mg/dL (ref 8.4–10.5)
Chloride: 110 mEq/L (ref 96–112)
GFR, EST NON AFRICAN AMERICAN: 89 mL/min — AB (ref >90)
Glucose, Bld: 93 mg/dL (ref 70–99)
POTASSIUM: 4.2 meq/L (ref 3.7–5.3)
Sodium: 145 mEq/L (ref 137–147)

## 2013-10-17 ENCOUNTER — Other Ambulatory Visit: Payer: Self-pay | Admitting: Internal Medicine

## 2013-10-24 ENCOUNTER — Encounter: Payer: Self-pay | Admitting: *Deleted

## 2013-11-02 ENCOUNTER — Inpatient Hospital Stay (HOSPITAL_COMMUNITY): Admission: RE | Admit: 2013-11-02 | Payer: Self-pay | Source: Ambulatory Visit

## 2013-11-14 ENCOUNTER — Other Ambulatory Visit (HOSPITAL_COMMUNITY): Payer: Self-pay | Admitting: Cardiology

## 2013-11-14 DIAGNOSIS — I5022 Chronic systolic (congestive) heart failure: Secondary | ICD-10-CM

## 2013-11-14 MED ORDER — POTASSIUM CHLORIDE ER 10 MEQ PO TBCR: EXTENDED_RELEASE_TABLET | ORAL | Status: DC

## 2014-01-08 ENCOUNTER — Encounter (INDEPENDENT_AMBULATORY_CARE_PROVIDER_SITE_OTHER): Payer: PRIVATE HEALTH INSURANCE | Admitting: Ophthalmology

## 2014-01-21 ENCOUNTER — Telehealth: Payer: Self-pay | Admitting: Internal Medicine

## 2014-01-21 NOTE — Telephone Encounter (Signed)
   Reason for call:   I received a call from [name] at 942  PM indicating by accident he took 6 units of novolog instead of his 40 units of Levemir tonight.  He reports his sugar was 140 and dropped to 111.  He is currently asymptomatic and wants to know what to do.   Pertinent Data:   Normally takes Novolog 6u TIDWC and levemir 40u QHS, however took novolog 6 units as PM dose   Assessment / Plan / Recommendations:   I advised patient to continue to check his blood sugar in 1 hour and again at 11pm and 12pm.  I advised him to eat something now to prevent his sugars from going up.  I advised if sugars are not below 110 in 1 hour he may take his levemir at that time.  We reviewed the s/s of hypoglycemia.  As always, pt is advised that if symptoms worsen or new symptoms arise, they should go to an urgent care facility or to to ER for further evaluation.   Lucious Groves, DO   01/21/2014, 9:42 PM

## 2014-01-23 ENCOUNTER — Encounter: Payer: Self-pay | Admitting: Internal Medicine

## 2014-01-23 ENCOUNTER — Encounter (INDEPENDENT_AMBULATORY_CARE_PROVIDER_SITE_OTHER): Payer: PRIVATE HEALTH INSURANCE | Admitting: Ophthalmology

## 2014-01-23 ENCOUNTER — Ambulatory Visit (INDEPENDENT_AMBULATORY_CARE_PROVIDER_SITE_OTHER): Payer: PRIVATE HEALTH INSURANCE | Admitting: Internal Medicine

## 2014-01-23 ENCOUNTER — Emergency Department (HOSPITAL_COMMUNITY): Payer: PRIVATE HEALTH INSURANCE

## 2014-01-23 ENCOUNTER — Encounter (HOSPITAL_COMMUNITY): Payer: Self-pay | Admitting: Emergency Medicine

## 2014-01-23 ENCOUNTER — Emergency Department (HOSPITAL_COMMUNITY)
Admission: EM | Admit: 2014-01-23 | Discharge: 2014-01-23 | Disposition: A | Discharge status: Home / Self Care | Payer: PRIVATE HEALTH INSURANCE | Attending: Emergency Medicine | Admitting: Emergency Medicine

## 2014-01-23 VITALS — BP 141/91 | HR 71 | Temp 98.3°F | Ht 71.0 in | Wt 273.8 lb

## 2014-01-23 DIAGNOSIS — M549 Dorsalgia, unspecified: Secondary | ICD-10-CM | POA: Diagnosis present

## 2014-01-23 DIAGNOSIS — Z8619 Personal history of other infectious and parasitic diseases: Secondary | ICD-10-CM | POA: Diagnosis not present

## 2014-01-23 DIAGNOSIS — I5042 Chronic combined systolic (congestive) and diastolic (congestive) heart failure: Secondary | ICD-10-CM | POA: Insufficient documentation

## 2014-01-23 DIAGNOSIS — I4891 Unspecified atrial fibrillation: Secondary | ICD-10-CM | POA: Insufficient documentation

## 2014-01-23 DIAGNOSIS — E785 Hyperlipidemia, unspecified: Secondary | ICD-10-CM | POA: Insufficient documentation

## 2014-01-23 DIAGNOSIS — Q211 Atrial septal defect: Secondary | ICD-10-CM | POA: Diagnosis not present

## 2014-01-23 DIAGNOSIS — I129 Hypertensive chronic kidney disease with stage 1 through stage 4 chronic kidney disease, or unspecified chronic kidney disease: Secondary | ICD-10-CM | POA: Diagnosis not present

## 2014-01-23 DIAGNOSIS — Z7901 Long term (current) use of anticoagulants: Secondary | ICD-10-CM | POA: Diagnosis not present

## 2014-01-23 DIAGNOSIS — K219 Gastro-esophageal reflux disease without esophagitis: Secondary | ICD-10-CM | POA: Insufficient documentation

## 2014-01-23 DIAGNOSIS — E781 Pure hyperglyceridemia: Secondary | ICD-10-CM | POA: Diagnosis not present

## 2014-01-23 DIAGNOSIS — Z8546 Personal history of malignant neoplasm of prostate: Secondary | ICD-10-CM | POA: Insufficient documentation

## 2014-01-23 DIAGNOSIS — N182 Chronic kidney disease, stage 2 (mild): Secondary | ICD-10-CM | POA: Insufficient documentation

## 2014-01-23 DIAGNOSIS — Z862 Personal history of diseases of the blood and blood-forming organs and certain disorders involving the immune mechanism: Secondary | ICD-10-CM | POA: Insufficient documentation

## 2014-01-23 DIAGNOSIS — F209 Schizophrenia, unspecified: Secondary | ICD-10-CM | POA: Insufficient documentation

## 2014-01-23 DIAGNOSIS — Z87891 Personal history of nicotine dependence: Secondary | ICD-10-CM | POA: Insufficient documentation

## 2014-01-23 DIAGNOSIS — Z79899 Other long term (current) drug therapy: Secondary | ICD-10-CM | POA: Insufficient documentation

## 2014-01-23 DIAGNOSIS — G8929 Other chronic pain: Secondary | ICD-10-CM | POA: Diagnosis not present

## 2014-01-23 DIAGNOSIS — Z8673 Personal history of transient ischemic attack (TIA), and cerebral infarction without residual deficits: Secondary | ICD-10-CM | POA: Diagnosis not present

## 2014-01-23 DIAGNOSIS — R109 Unspecified abdominal pain: Secondary | ICD-10-CM

## 2014-01-23 DIAGNOSIS — Z794 Long term (current) use of insulin: Secondary | ICD-10-CM | POA: Diagnosis not present

## 2014-01-23 DIAGNOSIS — E118 Type 2 diabetes mellitus with unspecified complications: Secondary | ICD-10-CM

## 2014-01-23 LAB — POCT GLYCOSYLATED HEMOGLOBIN (HGB A1C): HEMOGLOBIN A1C: 6.4

## 2014-01-23 LAB — GLUCOSE, CAPILLARY: Glucose-Capillary: 121 mg/dL — ABNORMAL HIGH (ref 70–99)

## 2014-01-23 MED ORDER — KETOROLAC TROMETHAMINE 60 MG/2ML IM SOLN
60.0000 mg | Freq: Once | INTRAMUSCULAR | Status: AC
  Administered 2014-01-23: 60 mg via INTRAMUSCULAR
  Filled 2014-01-23: qty 2

## 2014-01-23 NOTE — Assessment & Plan Note (Addendum)
I talked to his pain clinic and they were willing to see him soon.  He will follow up with his pain clinic on 12/10 at 12:30 PM. He wanted to go to the ED immediately after this visit, per his pain clinic this was fine--he received toradol 60mg  IM.   Advised pt to follow up with his pain clinic on 12/30 and he confirmed that he has transportation arranged for that appointment.

## 2014-01-23 NOTE — ED Provider Notes (Signed)
CSN: [id number]     Arrival date & time 01/23/14  8676 History   First MD Initiated Contact with Patient 01/23/14 1126     Chief Complaint  Patient presents with  . Abdominal Pain  . Back Pain     (Consider location/radiation/quality/duration/timing/severity/associated sxs/prior Treatment) HPI   [name] [name] is a 58 y.o. male who presents directly from his primary care office, today where he asked for pain medication.  That was declined, and he was deferred to see his pain management doctor, tomorrow at a appointment scheduled at 12:30.  The patient decided to come here for treatment.  He states that he has been out of his pain medicines since December 16, when he ran out.  He ran out because he missed an appointment at his pain management office.  He denies trauma, fever, chills, nausea, vomiting recently.  He is immature here, came by bus.  There are no other known modifying factors.   Past Medical History  Diagnosis Date  . GERD (gastroesophageal reflux disease)   . Schizophrenia, schizo-affective   . Cataract   . Hyperlipidemia   . Hypertension     uncontrolled with medication noncompliance  . Hepatitis C   . HIT (heparin-induced thrombocytopenia)   . Continuous chronic alcoholism   . H/O cocaine abuse   . Stroke 01/2012    Small cerebellar infarcts right greater than left as well as questionable acute left external capsule and caudate nuclear punctate lacunar infarcts noted per MRI (01/2012) - presumed to be embolic likely source PFO with right to left shunt (noted per TEE 01/ 2014)  . Chronic combined systolic and diastolic CHF (congestive heart failure)     a) EF 40-45% per 2D echo (02/2012) with grade 1 DD b)  NICM c) RHC (04/2012): RA: 4, RV 45/3/4, PA 42/9 (24), PCWP 14, Fick CO/CI: 5.2 /2.2, PVR 1.9 WU, PA 62% and 64% d) ECHO (10/2012) EF 40-45%, grade II DD, RV nl  . Degenerative lumbar spinal stenosis     s/p L2-3, L3-4, L4-5 laminectomy partial facetectomy, and  bilateral foraminotomy  . History of pancreatitis 01/2011    Admission for acute pancreatitis presumed 2/2 ongoing alcohol abuse- and hypertriglyceridemia  . PFO (patent foramen ovale) 01/2012    with right to left shunt, noted per TEE in evaluation for source of embolic stroke in 07/2092  . CKD (chronic kidney disease) stage 2, GFR 60-89 ml/min     BL SCr approximately 1-1.3  . 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 Hepatic steatosis     suspected 2/2 alcohol abuse  . Splenic cyst   . Colitis 05/2009    History of colitis of ascending colon noted on CT abd/pelvis (05/2009), with interval resolution with subsequent CT  . Hypertriglyceridemia   . Cancer     Prostate cancer-bx. 3 weeks ago  . NICM (nonischemic cardiomyopathy)     a. LHC (04/2012): nl arteries  . Atrial fibrillation    Past Surgical History  Procedure Laterality Date  . L2-3, l3-4, l4-5 laminectomy, partial facetectomy    . Left achilles Right 2007  . Tonsillectomy    . Knee arthroscopy    . I&d extremity  03/20/2011    Procedure: IRRIGATION AND DEBRIDEMENT EXTREMITY;  Surgeon: Kerin Salen, MD;  Location: [place];  Service: Orthopedics;  Laterality: Left;  I&D LEFT ACHILLIES TENDON  . Eye surgery      right eye  .  Resection distal clavical  09/17/2011    Procedure: RESECTION DISTAL CLAVICAL;  Surgeon: Nita Sells, MD;  Location: [place];  Service: Orthopedics;  Laterality: Right;  right shoulder arthroscopy with sad and open distal clavicle excision   . Esophagogastroduodenoscopy N/A 06/25/2012    Procedure: ESOPHAGOGASTRODUODENOSCOPY (EGD);  Surgeon: Milus Banister, MD;  Location: [place];  Service: Endoscopy;  Laterality: N/A;  . Robot assisted laparoscopic radical prostatectomy N/A 12/16/2012    Procedure: ROBOTIC ASSISTED LAPAROSCOPIC PROSTATECTOMY ;  Surgeon: Ardis Hughs, MD;  Location: [facility];  Service:  Urology;  Laterality: N/A;   Family History  Problem Relation Age of Onset  . CAD Mother 47    deceased  . CAD Sister   . CAD Brother 43    died from MI at age 59yo  . Hypertension     History  Substance Use Topics  . Smoking status: Former Smoker -- 0.50 packs/day for 30 years    Types: Cigarettes    Quit date: 06/24/2001  . Smokeless tobacco: Never Used  . Alcohol Use: No     Comment: Quit again 09/19/2013    Review of Systems  All other systems reviewed and are negative.     Allergies  Heparin and Thorazine  Home Medications   Prior to Admission medications   Medication Sig Start Date End Date Taking? Authorizing Provider  ARIPiprazole (ABILIFY) 20 MG tablet Take 20 mg by mouth at bedtime.   Yes Historical Provider, MD  carvedilol (COREG) 25 MG tablet Take 1 tablet (25 mg total) by mouth 2 (two) times daily with a meal. Patient taking differently: Take 25 mg by mouth daily.  08/01/13  Yes Amy D Clegg, NP  cloNIDine (CATAPRES) 0.1 MG tablet Take 1 tablet (0.1 mg total) by mouth daily. 10/03/13  Yes Rande Brunt, NP  insulin aspart (NOVOLOG) 100 UNIT/ML injection Inject 5 Units into the skin 3 (three) times daily with meals. Patient taking differently: Inject 6 Units into the skin 3 (three) times daily with meals.  08/22/13  Yes Vijaya Mercer Pod, MD  Insulin Detemir (LEVEMIR FLEXTOUCH) 100 UNIT/ML Pen Inject 40 Units into the skin daily at 10 pm. 08/22/13  Yes Vijaya Mercer Pod, MD  lisinopril (PRINIVIL,ZESTRIL) 20 MG tablet Take 1 tablet (20 mg total) by mouth 2 (two) times daily. Patient taking differently: Take 20 mg by mouth daily.  05/31/13  Yes Rande Brunt, NP  metolazone (ZAROXOLYN) 2.5 MG tablet Take 2.5 mg by mouth daily as needed (for fluid).    Yes Historical Provider, MD  oxyCODONE-acetaminophen (PERCOCET) 10-325 MG per tablet Take 1 tablet by mouth every 12 (twelve) hours as needed for pain.  12/20/13  Yes Historical Provider, MD  pantoprazole  (PROTONIX) 40 MG tablet Take 40 mg by mouth daily.   Yes Historical Provider, MD  potassium chloride (K-DUR) 10 MEQ tablet Take one tab daily and Take extra tablet (10 meq) when take Metolazone Patient taking differently: Take 20-30 mEq by mouth daily. Take two tablets daily and Take extra tablet (10 meq) when take Metolazone 11/14/13  Yes Rande Brunt, NP  rivaroxaban (XARELTO) 20 MG TABS tablet Take 1 tablet (20 mg total) by mouth daily. 09/21/13  Yes Amy D Clegg, NP  rosuvastatin (CRESTOR) 20 MG tablet Take 20 mg by mouth daily.   Yes Historical Provider, MD  spironolactone (ALDACTONE) 25 MG tablet Take 0.5 tablets (12.5 mg total) by mouth daily. 10/03/13  Yes Rande Brunt,  NP  torsemide (DEMADEX) 20 MG tablet Take 40 mg by mouth 2 (two) times daily. 08/22/13  Yes Malena Catholic, MD  traZODone (DESYREL) 100 MG tablet Take 100 mg by mouth at bedtime as needed for sleep.  08/25/12  Yes Dominic Pea, DO  ACCU-CHEK FASTCLIX LANCETS MISC Use as instructed to test four times a day. DX: 250.92, insulin requiring 05/25/13   Dominic Pea, DO  Blood Glucose Monitoring Suppl (ACCU-CHEK NANO SMARTVIEW) W/DEVICE KIT Use as instructed to test four times a day. DX: 250.92, insulin requiring 05/25/13   Dominic Pea, DO  glucose blood (ACCU-CHEK SMARTVIEW) test strip Use as instructed to test four times a day. DX: 250.92, insulin requiring 05/25/13   Dominic Pea, DO  Insulin Pen Needle (FIFTY50 PEN NEEDLES) 31G X 5 MM MISC Use to inject insulin up to 4 times daily. Insulin dependent. diag code 250.00 05/16/13   Dominic Pea, DO  pantoprazole (PROTONIX) 40 MG tablet take 1 tablet by mouth twice a day Patient not taking: Reported on 01/23/2014 10/17/13   Nischal Narendra, MD   BP 127/70 mmHg  Pulse 62  Temp(Src) 98.5 F (36.9 C) (Oral)  Resp 20  Ht 6' (1.829 m)  Wt 273 lb (123.832 kg)  BMI 37.02 kg/m2  SpO2 100% Physical Exam  Constitutional: He is oriented to person, place, and time. He  appears well-developed and well-nourished.  HENT:  Head: Normocephalic and atraumatic.  Right Ear: External ear normal.  Left Ear: External ear normal.  Eyes: Conjunctivae and EOM are normal. Pupils are equal, round, and reactive to light.  Neck: Normal range of motion and phonation normal. Neck supple.  Cardiovascular: Normal rate, regular rhythm and normal heart sounds.   Pulmonary/Chest: Effort normal and breath sounds normal. He exhibits no bony tenderness.  Abdominal: Soft. There is no tenderness.  Musculoskeletal: Normal range of motion.  Back is nontender to palpation.  He is resting comfortably on the stretcher.  Neurological: He is alert and oriented to person, place, and time. No cranial nerve deficit or sensory deficit. He exhibits normal muscle tone. Coordination normal.  Skin: Skin is warm, dry and intact.  Psychiatric: He has a normal mood and affect. His behavior is normal. Judgment and thought content normal.  Nursing note and vitals reviewed.   ED Course  Procedures (including critical care time)  Medications  ketorolac (TORADOL) injection 60 mg (60 mg Intramuscular Given 01/23/14 1330)    Patient Vitals for the past 24 hrs:  BP Temp Temp src Pulse Resp SpO2 Height Weight  01/23/14 1330 148/93 mmHg - - 69 - 100 % - -  01/23/14 1200 127/70 mmHg - - 62 - 100 % - -  01/23/14 1133 138/77 mmHg - - 68 - 100 % - -  01/23/14 1006 134/84 mmHg 98.5 F (36.9 C) Oral 69 20 98 % 6' (1.829 m) 273 lb (123.832 kg)       Labs Review Labs Reviewed - No data to display  Imaging Review Dg Abd Acute W/chest  01/23/2014   CLINICAL DATA:  Two day history of constipation and lower abdominal pain, primarily on the left  EXAM: ACUTE ABDOMEN SERIES (ABDOMEN 2 VIEW & CHEST 1 VIEW)  COMPARISON:  CT abdomen and pelvis December 19, 2012; chest radiograph September 20, 2013  FINDINGS: PA chest: No edema or consolidation. Heart is upper normal in size with pulmonary vascularity within normal  limits. No adenopathy. No bone lesions.  Supine and upright abdomen: There is moderate stool  in the colon. There is no bowel dilatation or air-fluid level suggesting obstruction. No free air. There is postoperative change in the lower lumbar spine.  IMPRESSION: Bowel gas pattern unremarkable. Moderate stool throughout colon. No edema or consolidation.   Electronically Signed   By: Lowella Grip M.D.   On: 01/23/2014 13:04     EKG Interpretation None      MDM   Final diagnoses:  Chronic back pain     chronic back pain and medication noncompliance.  There is no indication to give him narcotic analgesia in the emergency department setting, or as an outpatient.   Nursing Notes Reviewed/ Care Coordinated Applicable Imaging Reviewed Interpretation of Laboratory Data incorporated into ED treatment  The patient appears reasonably screened and/or stabilized for discharge and I doubt any other medical condition or other [facility] requiring further screening, evaluation, or treatment in the ED at this time prior to discharge.  Plan: Home Medications- USUAL; Home Treatments- REST; return here if the recommended treatment, does not improve the symptoms; Recommended follow up- pcp PRN   Richarda Blade, MD 01/23/14 1337

## 2014-01-23 NOTE — ED Notes (Signed)
Pt c/o back pain around to left side and into abd area; pt sts some possible constipation

## 2014-01-23 NOTE — Discharge Instructions (Signed)

## 2014-01-23 NOTE — Patient Instructions (Addendum)
General Instructions: -Please go to your appointment at the pain clinic tomorrow at 12:30 PM.  -Please make a follow up appointment with Dr. Dareen Piano as soon as possible.   Happy New Year!   Please bring your medicines with you each time you come to clinic.  Medicines may include prescription medications, over-the-counter medications, herbal remedies, eye drops, vitamins, or other pills.   Progress Toward Treatment Goals:  Treatment Goal 07/12/2013  Hemoglobin A1C improved  Blood pressure at goal    Self Care Goals & Plans:  Self Care Goal 01/23/2014  Manage my medications take my medicines as prescribed; bring my medications to every visit; refill my medications on time  Monitor my health -  Eat healthy foods drink diet soda or water instead of juice or soda; eat more vegetables; eat foods that are low in salt; eat baked foods instead of fried foods; eat fruit for snacks and desserts  Be physically active -    Home Blood Glucose Monitoring 07/12/2013  Check my blood sugar once a day     Care Management & Community Referrals:  Referral 05/29/2013  Referrals made for care management support none needed

## 2014-01-24 NOTE — Progress Notes (Signed)
   Subjective:    Patient ID: [name] [name], male    DOB: 15-Feb-1955, 58 y.o.   MRN: [id number]  HPI [name] [name] is a 58 yr old man with PMH of DM2, chronic back pain, HTN, presenting with complains of needing pain medication. He states that his pain medication was not refilled on time by is pain clinic this month. He missed his appointment there on Dec 13th and has been out of his pain medications since then with increase in his chronic back pain. He denies bowel/bladder incontinence, leg weakness or numbness. He requests assistance getting an appointment at the pain clinic as soon as possible.    Review of Systems  Constitutional: Negative for fever, chills, activity change, appetite change and fatigue.  Respiratory: Negative for cough and shortness of breath.   Cardiovascular: Negative for palpitations.  Musculoskeletal: Positive for back pain.  Neurological: Negative for dizziness, light-headedness and headaches.  Psychiatric/Behavioral: Negative for agitation.       Objective:   Physical Exam  Constitutional: He is oriented to person, place, and time. He appears well-developed and well-nourished. No distress.  Sitting in wheelchair  Cardiovascular: Normal rate.   Pulmonary/Chest: Effort normal. No respiratory distress.  Neurological: He is alert and oriented to person, place, and time.  Skin: Skin is warm and dry. He is not diaphoretic.  Psychiatric: He has a normal mood and affect.  Nursing note and vitals reviewed.         Assessment & Plan:

## 2014-01-24 NOTE — Assessment & Plan Note (Signed)
Lab Results  Component Value Date   HGBA1C 6.4 01/23/2014   HGBA1C 6.9 07/12/2013   HGBA1C 7.4 03/28/2013     Assessment: Diabetes control:  controlled Progress toward A1C goal:   at goal Comments: He is on Novolog 6 uni TID ac and Levemir 40 u qHS. No hypoglycemia reported, did not have his meter during this visit  Plan: Medications:  continue current medications Home glucose monitoring: Frequency:   Timing:   Instruction/counseling given: reminded to bring blood glucose meter & log to each visit and reminded to bring medications to each visit Educational resources provided: brochure (has information) Self management tools provided:   Other plans: He needs to follow up with his PCP as soon as possible.

## 2014-01-25 NOTE — Progress Notes (Signed)
Medicine attending: Medical history, presenting problems, physical findings, and medications, reviewed with Dr Kennerly on the day of the patient encounter and I concur with her evaluation and management plan. 

## 2014-01-29 ENCOUNTER — Other Ambulatory Visit: Payer: Self-pay | Admitting: Internal Medicine

## 2014-01-29 DIAGNOSIS — Z8546 Personal history of malignant neoplasm of prostate: Secondary | ICD-10-CM | POA: Diagnosis not present

## 2014-02-05 ENCOUNTER — Telehealth: Payer: Self-pay | Admitting: *Deleted

## 2014-02-05 ENCOUNTER — Encounter (HOSPITAL_COMMUNITY): Payer: Self-pay

## 2014-02-05 ENCOUNTER — Other Ambulatory Visit (HOSPITAL_COMMUNITY): Payer: Self-pay | Admitting: *Deleted

## 2014-02-05 MED ORDER — RIVAROXABAN 20 MG PO TABS: 20.0000 mg | ORAL_TABLET | Freq: Every day | ORAL | Status: DC

## 2014-02-05 NOTE — Telephone Encounter (Signed)
Call from patient stated that his Trazadone is not helping him sleep said that a family member uses Seroquel and wants to get a prescription for this.  Call to patient to ask what med

## 2014-02-06 ENCOUNTER — Encounter (HOSPITAL_COMMUNITY): Payer: Self-pay

## 2014-02-06 NOTE — Telephone Encounter (Signed)
It would be inappropriate to give him seroquel for sleep and if he is having continued insomnia he should make an appointment to be seen in the clinic to have it assessed

## 2014-02-07 ENCOUNTER — Ambulatory Visit: Payer: Self-pay | Admitting: Internal Medicine

## 2014-02-20 DIAGNOSIS — Z8546 Personal history of malignant neoplasm of prostate: Secondary | ICD-10-CM | POA: Diagnosis not present

## 2014-02-22 DIAGNOSIS — M5137 Other intervertebral disc degeneration, lumbosacral region: Secondary | ICD-10-CM | POA: Diagnosis not present

## 2014-02-22 DIAGNOSIS — M961 Postlaminectomy syndrome, not elsewhere classified: Secondary | ICD-10-CM | POA: Diagnosis not present

## 2014-02-22 DIAGNOSIS — Z79899 Other long term (current) drug therapy: Secondary | ICD-10-CM | POA: Diagnosis not present

## 2014-02-22 DIAGNOSIS — G894 Chronic pain syndrome: Secondary | ICD-10-CM | POA: Diagnosis not present

## 2014-02-22 DIAGNOSIS — M79606 Pain in leg, unspecified: Secondary | ICD-10-CM | POA: Diagnosis not present

## 2014-02-24 ENCOUNTER — Encounter (HOSPITAL_COMMUNITY): Payer: Self-pay | Admitting: *Deleted

## 2014-02-24 ENCOUNTER — Emergency Department (HOSPITAL_COMMUNITY)
Admission: EM | Admit: 2014-02-24 | Discharge: 2014-02-25 | Disposition: A | Discharge status: Home / Self Care | Payer: Medicare Other | Attending: Emergency Medicine | Admitting: Emergency Medicine

## 2014-02-24 ENCOUNTER — Emergency Department (HOSPITAL_COMMUNITY): Payer: Medicare Other

## 2014-02-24 DIAGNOSIS — R55 Syncope and collapse: Secondary | ICD-10-CM | POA: Diagnosis not present

## 2014-02-24 DIAGNOSIS — Y998 Other external cause status: Secondary | ICD-10-CM | POA: Insufficient documentation

## 2014-02-24 DIAGNOSIS — W01198A Fall on same level from slipping, tripping and stumbling with subsequent striking against other object, initial encounter: Secondary | ICD-10-CM | POA: Diagnosis not present

## 2014-02-24 DIAGNOSIS — Z862 Personal history of diseases of the blood and blood-forming organs and certain disorders involving the immune mechanism: Secondary | ICD-10-CM | POA: Diagnosis not present

## 2014-02-24 DIAGNOSIS — Z8673 Personal history of transient ischemic attack (TIA), and cerebral infarction without residual deficits: Secondary | ICD-10-CM | POA: Diagnosis not present

## 2014-02-24 DIAGNOSIS — Z79899 Other long term (current) drug therapy: Secondary | ICD-10-CM | POA: Insufficient documentation

## 2014-02-24 DIAGNOSIS — Z87891 Personal history of nicotine dependence: Secondary | ICD-10-CM | POA: Insufficient documentation

## 2014-02-24 DIAGNOSIS — Q211 Atrial septal defect: Secondary | ICD-10-CM | POA: Diagnosis not present

## 2014-02-24 DIAGNOSIS — S199XXA Unspecified injury of neck, initial encounter: Secondary | ICD-10-CM | POA: Diagnosis not present

## 2014-02-24 DIAGNOSIS — R404 Transient alteration of awareness: Secondary | ICD-10-CM | POA: Diagnosis not present

## 2014-02-24 DIAGNOSIS — Z8639 Personal history of other endocrine, nutritional and metabolic disease: Secondary | ICD-10-CM | POA: Diagnosis not present

## 2014-02-24 DIAGNOSIS — K219 Gastro-esophageal reflux disease without esophagitis: Secondary | ICD-10-CM | POA: Diagnosis not present

## 2014-02-24 DIAGNOSIS — S0990XA Unspecified injury of head, initial encounter: Secondary | ICD-10-CM | POA: Insufficient documentation

## 2014-02-24 DIAGNOSIS — Y9389 Activity, other specified: Secondary | ICD-10-CM | POA: Insufficient documentation

## 2014-02-24 DIAGNOSIS — Z8546 Personal history of malignant neoplasm of prostate: Secondary | ICD-10-CM | POA: Insufficient documentation

## 2014-02-24 DIAGNOSIS — Z794 Long term (current) use of insulin: Secondary | ICD-10-CM | POA: Insufficient documentation

## 2014-02-24 DIAGNOSIS — F10129 Alcohol abuse with intoxication, unspecified: Secondary | ICD-10-CM | POA: Diagnosis not present

## 2014-02-24 DIAGNOSIS — N182 Chronic kidney disease, stage 2 (mild): Secondary | ICD-10-CM | POA: Diagnosis not present

## 2014-02-24 DIAGNOSIS — S3992XA Unspecified injury of lower back, initial encounter: Secondary | ICD-10-CM | POA: Insufficient documentation

## 2014-02-24 DIAGNOSIS — F10120 Alcohol abuse with intoxication, uncomplicated: Secondary | ICD-10-CM | POA: Insufficient documentation

## 2014-02-24 DIAGNOSIS — I4891 Unspecified atrial fibrillation: Secondary | ICD-10-CM | POA: Insufficient documentation

## 2014-02-24 DIAGNOSIS — I129 Hypertensive chronic kidney disease with stage 1 through stage 4 chronic kidney disease, or unspecified chronic kidney disease: Secondary | ICD-10-CM | POA: Insufficient documentation

## 2014-02-24 DIAGNOSIS — I5042 Chronic combined systolic (congestive) and diastolic (congestive) heart failure: Secondary | ICD-10-CM | POA: Insufficient documentation

## 2014-02-24 DIAGNOSIS — Y908 Blood alcohol level of 240 mg/100 ml or more: Secondary | ICD-10-CM | POA: Diagnosis not present

## 2014-02-24 DIAGNOSIS — Z8619 Personal history of other infectious and parasitic diseases: Secondary | ICD-10-CM | POA: Diagnosis not present

## 2014-02-24 DIAGNOSIS — F10920 Alcohol use, unspecified with intoxication, uncomplicated: Secondary | ICD-10-CM

## 2014-02-24 DIAGNOSIS — Z7901 Long term (current) use of anticoagulants: Secondary | ICD-10-CM | POA: Insufficient documentation

## 2014-02-24 DIAGNOSIS — Y92190 Kitchen in other specified residential institution as the place of occurrence of the external cause: Secondary | ICD-10-CM | POA: Diagnosis not present

## 2014-02-24 LAB — URINALYSIS, ROUTINE W REFLEX MICROSCOPIC
BILIRUBIN URINE: NEGATIVE
GLUCOSE, UA: NEGATIVE mg/dL
Ketones, ur: NEGATIVE mg/dL
Leukocytes, UA: NEGATIVE
NITRITE: NEGATIVE
PH: 5 (ref 5.0–8.0)
Protein, ur: >300 mg/dL — AB
Specific Gravity, Urine: 1.02 (ref 1.005–1.030)
Urobilinogen, UA: 0.2 mg/dL (ref 0.0–1.0)

## 2014-02-24 LAB — BASIC METABOLIC PANEL
Anion gap: 5 (ref 5–15)
BUN: 13 mg/dL (ref 6–23)
CALCIUM: 9.9 mg/dL (ref 8.4–10.5)
CO2: 24 mmol/L (ref 19–32)
Chloride: 111 mmol/L (ref 96–112)
Creatinine, Ser: 0.89 mg/dL (ref 0.50–1.35)
GFR calc Af Amer: >90 mL/min (ref >90)
GLUCOSE: 105 mg/dL — AB (ref 70–99)
Potassium: 4.4 mmol/L (ref 3.5–5.1)
Sodium: 140 mmol/L (ref 135–145)

## 2014-02-24 LAB — CBC
HCT: 47.7 % (ref 39.0–52.0)
HEMOGLOBIN: 15.5 g/dL (ref 13.0–17.0)
MCH: 28.3 pg (ref 26.0–34.0)
MCHC: 32.5 g/dL (ref 30.0–36.0)
MCV: 87.2 fL (ref 78.0–100.0)
Platelets: 236 10*3/uL (ref 150–400)
RBC: 5.47 MIL/uL (ref 4.22–5.81)
RDW: 15.8 % — ABNORMAL HIGH (ref 11.5–15.5)
WBC: 9.7 10*3/uL (ref 4.0–10.5)

## 2014-02-24 LAB — I-STAT TROPONIN, ED: Troponin i, poc: 0.07 ng/mL (ref 0.00–0.08)

## 2014-02-24 LAB — URINE MICROSCOPIC-ADD ON

## 2014-02-24 LAB — ETHANOL: ALCOHOL ETHYL (B): 251 mg/dL — AB (ref 0–9)

## 2014-02-24 NOTE — ED Notes (Signed)
Patient transported to CT 

## 2014-02-24 NOTE — ED Notes (Signed)
Dr Yelverton at bedside.  

## 2014-02-24 NOTE — ED Provider Notes (Signed)
CSN: [id number]     Arrival date & time 02/24/14  2102 History  This chart was scribed for Loren Racer, MD by Richarda Overlie, ED Scribe. This patient was seen in room A07C/A07C and the patient's care was started 11:23 PM.    Chief Complaint  Patient presents with  . Loss of Consciousness   The history is provided by the patient. No language interpreter was used.   HPI Comments: JONAH GINGRAS is a 59 y.o. male with a history of HTN, stroke, CHF, A-fib, CKD and alcoholism and who presents to the Emergency Department stating that he fell down 3 times today. He states he has been drinking today but "not that much to do that." Pt states he was standing in the kitchen and he when he fell down, hitting his neck. He says that he did not hit that hard and says a friend witnessed his fall. Pt states that he last fell about 4 hours ago and says that he called EMS after his third fall. Pt states that he remembers falling down and denies LOC. Pt complains of a mild HA at this time. He denies any prior similar episodes. Pt states that his vision is a little blurry but says that he sometimes experiences blurry vision. He denies any shaking movements.    Past Medical History  Diagnosis Date  . GERD (gastroesophageal reflux disease)   . Schizophrenia, schizo-affective   . Cataract   . Hyperlipidemia   . Hypertension     uncontrolled with medication noncompliance  . Hepatitis C   . HIT (heparin-induced thrombocytopenia)   . Continuous chronic alcoholism   . H/O cocaine abuse   . Stroke 01/2012    Small cerebellar infarcts right greater than left as well as questionable acute left external capsule and caudate nuclear punctate lacunar infarcts noted per MRI (01/2012) - presumed to be embolic likely source PFO with right to left shunt (noted per TEE 01/ 2014)  . Chronic combined systolic and diastolic CHF (congestive heart failure)     a) EF 40-45% per 2D echo (02/2012) with grade 1 DD b)  NICM c)  RHC (04/2012): RA: 4, RV 45/3/4, PA 42/9 (24), PCWP 14, Fick CO/CI: 5.2 /2.2, PVR 1.9 WU, PA 62% and 64% d) ECHO (10/2012) EF 40-45%, grade II DD, RV nl  . Degenerative lumbar spinal stenosis     s/p L2-3, L3-4, L4-5 laminectomy partial facetectomy, and bilateral foraminotomy  . History of pancreatitis 01/2011    Admission for acute pancreatitis presumed 2/2 ongoing alcohol abuse- and hypertriglyceridemia  . PFO (patent foramen ovale) 01/2012    with right to left shunt, noted per TEE in evaluation for source of embolic stroke in 02/2012  . CKD (chronic kidney disease) stage 2, GFR 60-89 ml/min     BL SCr approximately 1-1.3  . 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 Hepatic steatosis     suspected 2/2 alcohol abuse  . Splenic cyst   . Colitis 05/2009    History of colitis of ascending colon noted on CT abd/pelvis (05/2009), with interval resolution with subsequent CT  . Hypertriglyceridemia   . Cancer     Prostate cancer-bx. 3 weeks ago  . NICM (nonischemic cardiomyopathy)     a. LHC (04/2012): nl arteries  . Atrial fibrillation    Past Surgical History  Procedure Laterality Date  . L2-3, l3-4, l4-5 laminectomy, partial facetectomy    . Left  achilles Right 2007  . Tonsillectomy    . Knee arthroscopy    . I&d extremity  03/20/2011    Procedure: IRRIGATION AND DEBRIDEMENT EXTREMITY;  Surgeon: Nestor Lewandowsky, MD;  Location: [facility] OR;  Service: Orthopedics;  Laterality: Left;  I&D LEFT ACHILLIES TENDON  . Eye surgery      right eye  . Resection distal clavical  09/17/2011    Procedure: RESECTION DISTAL CLAVICAL;  Surgeon: Mable Paris, MD;  Location: [facility];  Service: Orthopedics;  Laterality: Right;  right shoulder arthroscopy with sad and open distal clavicle excision   . Esophagogastroduodenoscopy N/A 06/25/2012    Procedure: ESOPHAGOGASTRODUODENOSCOPY (EGD);  Surgeon: Rachael Fee, MD;   Location: [facility] ENDOSCOPY;  Service: Endoscopy;  Laterality: N/A;  . Robot assisted laparoscopic radical prostatectomy N/A 12/16/2012    Procedure: ROBOTIC ASSISTED LAPAROSCOPIC PROSTATECTOMY ;  Surgeon: Crist Fat, MD;  Location: [facility] ORS;  Service: Urology;  Laterality: N/A;   Family History  Problem Relation Age of Onset  . CAD Mother 76    deceased  . CAD Sister   . CAD Brother 56    died from MI at age 60yo  . Hypertension     History  Substance Use Topics  . Smoking status: Former Smoker -- 0.50 packs/day for 30 years    Types: Cigarettes    Quit date: 06/24/2001  . Smokeless tobacco: Never Used  . Alcohol Use: No     Comment: Quit again 09/19/2013    Review of Systems  Constitutional: Negative for fever and chills.  Eyes: Positive for visual disturbance.  Respiratory: Negative for shortness of breath.   Cardiovascular: Negative for chest pain.  Gastrointestinal: Negative for nausea, vomiting and abdominal pain.  Genitourinary: Negative for dysuria and difficulty urinating.  Musculoskeletal: Positive for back pain and neck pain. Negative for neck stiffness.  Skin: Negative for rash and wound.  Neurological: Positive for headaches. Negative for dizziness, seizures, syncope, weakness, light-headedness and numbness.  All other systems reviewed and are negative.     Allergies  Heparin and Thorazine  Home Medications   Prior to Admission medications   Medication Sig Start Date End Date Taking? Authorizing Provider  ARIPiprazole (ABILIFY) 20 MG tablet Take 20 mg by mouth at bedtime.   Yes Historical Provider, MD  carvedilol (COREG) 25 MG tablet Take 1 tablet (25 mg total) by mouth 2 (two) times daily with a meal. Patient taking differently: Take 25 mg by mouth daily.  08/01/13  Yes Amy D Clegg, NP  cloNIDine (CATAPRES) 0.1 MG tablet Take 1 tablet (0.1 mg total) by mouth daily. 10/03/13  Yes Aundria Rud, NP  CRESTOR 20 MG tablet take 1 tablet by mouth once daily  01/29/14  Yes Burns Spain, MD  insulin aspart (NOVOLOG) 100 UNIT/ML injection Inject 5 Units into the skin 3 (three) times daily with meals. Patient taking differently: Inject 6 Units into the skin 3 (three) times daily with meals.  08/22/13  Yes Vijaya Len Childs, MD  Insulin Detemir (LEVEMIR FLEXTOUCH) 100 UNIT/ML Pen Inject 40 Units into the skin daily at 10 pm. 08/22/13  Yes Vijaya Len Childs, MD  lisinopril (PRINIVIL,ZESTRIL) 20 MG tablet Take 1 tablet (20 mg total) by mouth 2 (two) times daily. Patient taking differently: Take 20 mg by mouth daily.  05/31/13  Yes Aundria Rud, NP  metolazone (ZAROXOLYN) 2.5 MG tablet Take 2.5 mg by mouth daily as needed (for fluid).    Yes  Historical Provider, MD  oxyCODONE-acetaminophen (PERCOCET) 10-325 MG per tablet Take 1 tablet by mouth every 12 (twelve) hours as needed for pain.  12/20/13  Yes Historical Provider, MD  potassium chloride (K-DUR) 10 MEQ tablet Take one tab daily and Take extra tablet (10 meq) when take Metolazone Patient taking differently: Take 20-30 mEq by mouth daily. Take two tablets daily and Take extra tablet (10 meq) when take Metolazone 11/14/13  Yes Rande Brunt, NP  rivaroxaban (XARELTO) 20 MG TABS tablet Take 1 tablet (20 mg total) by mouth daily. MUST HAVE FOLLOW UP APPOINTMENT FOR FURTHER REFILLS 02/05/14  Yes Jolaine Artist, MD  spironolactone (ALDACTONE) 25 MG tablet Take 0.5 tablets (12.5 mg total) by mouth daily. 10/03/13  Yes Rande Brunt, NP  torsemide (DEMADEX) 20 MG tablet Take 40 mg by mouth 2 (two) times daily. 08/22/13  Yes Malena Catholic, MD  traZODone (DESYREL) 100 MG tablet Take 100 mg by mouth at bedtime as needed for sleep.  08/25/12  Yes Dominic Pea, DO  ACCU-CHEK FASTCLIX LANCETS MISC Use as instructed to test four times a day. DX: 250.92, insulin requiring 05/25/13   Dominic Pea, DO  Blood Glucose Monitoring Suppl (ACCU-CHEK NANO SMARTVIEW) W/DEVICE KIT Use as instructed to test  four times a day. DX: 250.92, insulin requiring 05/25/13   Dominic Pea, DO  glucose blood (ACCU-CHEK SMARTVIEW) test strip Use as instructed to test four times a day. DX: 250.92, insulin requiring 05/25/13   Dominic Pea, DO  Insulin Pen Needle (FIFTY50 PEN NEEDLES) 31G X 5 MM MISC Use to inject insulin up to 4 times daily. Insulin dependent. diag code 250.00 05/16/13   Dominic Pea, DO  pantoprazole (PROTONIX) 40 MG tablet take 1 tablet by mouth twice a day Patient not taking: Reported on 01/23/2014 10/17/13   Nischal Narendra, MD   BP 175/92 mmHg  Pulse 91  Temp(Src) 98 F (36.7 C)  Resp 33  SpO2 95% Physical Exam  Constitutional: He is oriented to person, place, and time. He appears well-developed and well-nourished.  HENT:  Head: Normocephalic and atraumatic.  Mouth/Throat: Oropharynx is clear and moist.  Eyes: EOM are normal. Pupils are equal, round, and reactive to light.  Neck: Normal range of motion. Neck supple.  Diffuse posterior midline tenderness.  Cardiovascular: Normal rate and regular rhythm.  Exam reveals no gallop and no friction rub.   No murmur heard. Pulmonary/Chest: Effort normal and breath sounds normal. No respiratory distress. He has no wheezes. He has no rales.  Abdominal: Soft. Bowel sounds are normal. He exhibits no distension and no mass. There is tenderness (mild diffuse abdominal tenderness. No focality. No rebound or guarding.). There is no rebound and no guarding.  Musculoskeletal: Normal range of motion. He exhibits no edema or tenderness.  Thoracic or lumbar tenderness with palpation. Full range of motion of all joints. Distal pulses intact.  Neurological: He is alert and oriented to person, place, and time.  Mildly slurred speech. 5/5 motor in all extremities. Sensation is fully intact. Bilateral finger to nose intact.  Skin: Skin is warm and dry. No rash noted. No erythema.  Psychiatric: He has a normal mood and affect. His behavior is normal.   Nursing note and vitals reviewed.   ED Course  Procedures   DIAGNOSTIC STUDIES: Oxygen Saturation is 92% on RA, normal by my interpretation.    COORDINATION OF CARE: 11:31 PM Discussed treatment plan with pt at bedside and pt agreed to plan.   Labs  Review Labs Reviewed  CBC - Abnormal; Notable for the following:    RDW 15.8 (*)    All other components within normal limits  BASIC METABOLIC PANEL - Abnormal; Notable for the following:    Glucose, Bld 105 (*)    All other components within normal limits  ETHANOL - Abnormal; Notable for the following:    Alcohol, Ethyl (B) 251 (*)    All other components within normal limits  URINALYSIS, ROUTINE W REFLEX MICROSCOPIC - Abnormal; Notable for the following:    Hgb urine dipstick SMALL (*)    Protein, ur >300 (*)    All other components within normal limits  URINE MICROSCOPIC-ADD ON - Abnormal; Notable for the following:    Casts HYALINE CASTS (*)    All other components within normal limits  I-STAT TROPOININ, ED    Imaging Review Ct Head Wo Contrast  02/25/2014   CLINICAL DATA:  Status post fall. Patient fell 3 times today. Hit neck when he fell.  EXAM: CT HEAD WITHOUT CONTRAST  CT CERVICAL SPINE WITHOUT CONTRAST  TECHNIQUE: Multidetector CT imaging of the head and cervical spine was performed following the standard protocol without intravenous contrast. Multiplanar CT image reconstructions of the cervical spine were also generated.  COMPARISON:  None.  FINDINGS: CT HEAD FINDINGS  There is no evidence of mass effect, midline shift, or extra-axial fluid collections. There is no evidence of a space-occupying lesion or intracranial hemorrhage. There is no evidence of a cortical-based area of acute infarction. There is generalized cerebral atrophy. There is periventricular white matter low attenuation likely secondary to microangiopathy.  The ventricles and sulci are appropriate for the patient's age. The basal cisterns are patent.   Visualized portions of the orbits are unremarkable. The visualized portions of the paranasal sinuses and mastoid air cells are unremarkable.  The osseous structures are unremarkable.  CT CERVICAL SPINE FINDINGS  The alignment is anatomic. The vertebral body heights are maintained. There is no acute fracture. There is no static listhesis. The prevertebral soft tissues are normal. The intraspinal soft tissues are not fully imaged on this examination due to poor soft tissue contrast, but there is no gross soft tissue abnormality.  There is moderate right and mild left facet arthropathy at C2-3 with right foraminal narrowing.  There is a broad-based disc bulge at C3-4 with bilateral facet arthropathy and uncovertebral degenerative change resulting in bilateral foraminal narrowing.  There is bilateral facet arthropathy at C4-5 with degenerative disc disease.  There is degenerative disc disease at C5-6 with bilateral facet arthropathy and uncovertebral degenerative changes.  There is degenerative disc disease at C6-7.  The visualized portions of the lung apices demonstrate no focal abnormality.  IMPRESSION: 1. No acute intracranial pathology. 2. No acute osseous injury of the cervical spine.   Electronically Signed   By: Kathreen Devoid   On: 02/25/2014 00:13   Ct Cervical Spine Wo Contrast  02/25/2014   CLINICAL DATA:  Status post fall. Patient fell 3 times today. Hit neck when he fell.  EXAM: CT HEAD WITHOUT CONTRAST  CT CERVICAL SPINE WITHOUT CONTRAST  TECHNIQUE: Multidetector CT imaging of the head and cervical spine was performed following the standard protocol without intravenous contrast. Multiplanar CT image reconstructions of the cervical spine were also generated.  COMPARISON:  None.  FINDINGS: CT HEAD FINDINGS  There is no evidence of mass effect, midline shift, or extra-axial fluid collections. There is no evidence of a space-occupying lesion or intracranial hemorrhage. There is  no evidence of a cortical-based  area of acute infarction. There is generalized cerebral atrophy. There is periventricular white matter low attenuation likely secondary to microangiopathy.  The ventricles and sulci are appropriate for the patient's age. The basal cisterns are patent.  Visualized portions of the orbits are unremarkable. The visualized portions of the paranasal sinuses and mastoid air cells are unremarkable.  The osseous structures are unremarkable.  CT CERVICAL SPINE FINDINGS  The alignment is anatomic. The vertebral body heights are maintained. There is no acute fracture. There is no static listhesis. The prevertebral soft tissues are normal. The intraspinal soft tissues are not fully imaged on this examination due to poor soft tissue contrast, but there is no gross soft tissue abnormality.  There is moderate right and mild left facet arthropathy at C2-3 with right foraminal narrowing.  There is a broad-based disc bulge at C3-4 with bilateral facet arthropathy and uncovertebral degenerative change resulting in bilateral foraminal narrowing.  There is bilateral facet arthropathy at C4-5 with degenerative disc disease.  There is degenerative disc disease at C5-6 with bilateral facet arthropathy and uncovertebral degenerative changes.  There is degenerative disc disease at C6-7.  The visualized portions of the lung apices demonstrate no focal abnormality.  IMPRESSION: 1. No acute intracranial pathology. 2. No acute osseous injury of the cervical spine.   Electronically Signed   By: Kathreen Devoid   On: 02/25/2014 00:13     EKG Interpretation   Date/Time:  Saturday February 24 2014 21:12:28 EST Ventricular Rate:  77 PR Interval:  190 QRS Duration: 102 QT Interval:  378 QTC Calculation: 427 R Axis:   118 Text Interpretation:  Sinus rhythm with sinus arrhythmia with occasional  Premature ventricular complexes Left posterior fascicular block Abnormal  ECG Confirmed by Lita Mains  MD, Nishan Ovens ([id number]) on 02/24/2014 11:06:36 PM       MDM   Final diagnoses:  Collapse  Alcohol intoxication, uncomplicated    I personally performed the services described in this documentation, which was scribed in my presence. The recorded information has been reviewed and is accurate.  Patient observed for extended period time of emergency department. He is now ambulating without any difficulty. No focal weakness or numbness. Safe to discharge home. Advised against excessive alcohol consumption.    Julianne Rice, MD 02/25/14 [phone]

## 2014-02-24 NOTE — ED Notes (Signed)
Pt in via EMS to triage, pt states that he has passed out three times today in the last 4 hours, c/o headache, CBG 165, no distress noted, alert and oriented

## 2014-02-25 NOTE — ED Notes (Signed)
Ambulated patient in the hall. Patient was slow but very steady.

## 2014-02-25 NOTE — ED Notes (Signed)
Patient transported to X-ray 

## 2014-02-25 NOTE — Discharge Instructions (Signed)
Alcohol Intoxication  Alcohol intoxication occurs when the amount of alcohol that a person has consumed impairs his or her ability to mentally and physically function. Alcohol directly impairs the normal chemical activity of the brain. Drinking large amounts of alcohol can lead to changes in mental function and behavior, and it can cause many physical effects that can be harmful.   Alcohol intoxication can range in severity from mild to very severe. Various factors can affect the level of intoxication that occurs, such as the person's age, gender, weight, frequency of alcohol consumption, and the presence of other medical conditions (such as diabetes, seizures, or heart conditions). Dangerous levels of alcohol intoxication may occur when people drink large amounts of alcohol in a short period (binge drinking). Alcohol can also be especially dangerous when combined with certain prescription medicines or "recreational" drugs.  SIGNS AND SYMPTOMS  Some common signs and symptoms of mild alcohol intoxication include:  · Loss of coordination.  · Changes in mood and behavior.  · Impaired judgment.  · Slurred speech.  As alcohol intoxication progresses to more severe levels, other signs and symptoms will appear. These may include:  · Vomiting.  · Confusion and impaired memory.  · Slowed breathing.  · Seizures.  · Loss of consciousness.  DIAGNOSIS   Your health care provider will take a medical history and perform a physical exam. You will be asked about the amount and type of alcohol you have consumed. Blood tests will be done to measure the concentration of alcohol in your blood. In many places, your blood alcohol level must be lower than 80 mg/dL (0.08%) to legally drive. However, many dangerous effects of alcohol can occur at much lower levels.   TREATMENT   People with alcohol intoxication often do not require treatment. Most of the effects of alcohol intoxication are temporary, and they go away as the alcohol naturally  leaves the body. Your health care provider will monitor your condition until you are stable enough to go home. Fluids are sometimes given through an IV access tube to help prevent dehydration.   HOME CARE INSTRUCTIONS  · Do not drive after drinking alcohol.  · Stay hydrated. Drink enough water and fluids to keep your urine clear or pale yellow. Avoid caffeine.    · Only take over-the-counter or prescription medicines as directed by your health care provider.    SEEK MEDICAL CARE IF:   · You have persistent vomiting.    · You do not feel better after a few days.  · You have frequent alcohol intoxication. Your health care provider can help determine if you should see a substance use treatment counselor.  SEEK IMMEDIATE MEDICAL CARE IF:   · You become shaky or tremble when you try to stop drinking.    · You shake uncontrollably (seizure).    · You throw up (vomit) blood. This may be bright red or may look like black coffee grounds.    · You have blood in your stool. This may be bright red or may appear as a black, tarry, bad smelling stool.    · You become lightheaded or faint.    MAKE SURE YOU:   · Understand these instructions.  · Will watch your condition.  · Will get help right away if you are not doing well or get worse.  Document Released: 10/22/2004 Document Revised: 09/14/2012 Document Reviewed: 06/17/2012  Exit[employer]® Patient Information ©2015 [employer]. This information is not intended to replace advice given to you by your health care provider. Make sure   you discuss any questions you have with your health care provider.

## 2014-03-05 ENCOUNTER — Emergency Department (HOSPITAL_COMMUNITY)
Admission: EM | Admit: 2014-03-05 | Discharge: 2014-03-06 | Disposition: A | Discharge status: Other Acute Inpatient Hospital | Payer: Medicare Other | Attending: Emergency Medicine | Admitting: Emergency Medicine

## 2014-03-05 ENCOUNTER — Encounter (HOSPITAL_COMMUNITY): Payer: Self-pay | Admitting: Emergency Medicine

## 2014-03-05 ENCOUNTER — Emergency Department (HOSPITAL_COMMUNITY): Payer: Medicare Other

## 2014-03-05 DIAGNOSIS — N182 Chronic kidney disease, stage 2 (mild): Secondary | ICD-10-CM | POA: Insufficient documentation

## 2014-03-05 DIAGNOSIS — F209 Schizophrenia, unspecified: Secondary | ICD-10-CM | POA: Diagnosis not present

## 2014-03-05 DIAGNOSIS — F32A Depression, unspecified: Secondary | ICD-10-CM

## 2014-03-05 DIAGNOSIS — K219 Gastro-esophageal reflux disease without esophagitis: Secondary | ICD-10-CM | POA: Insufficient documentation

## 2014-03-05 DIAGNOSIS — Z794 Long term (current) use of insulin: Secondary | ICD-10-CM | POA: Diagnosis not present

## 2014-03-05 DIAGNOSIS — Z8673 Personal history of transient ischemic attack (TIA), and cerebral infarction without residual deficits: Secondary | ICD-10-CM | POA: Insufficient documentation

## 2014-03-05 DIAGNOSIS — Z79899 Other long term (current) drug therapy: Secondary | ICD-10-CM | POA: Diagnosis not present

## 2014-03-05 DIAGNOSIS — F329 Major depressive disorder, single episode, unspecified: Secondary | ICD-10-CM | POA: Diagnosis not present

## 2014-03-05 DIAGNOSIS — M19211 Secondary osteoarthritis, right shoulder: Secondary | ICD-10-CM | POA: Diagnosis not present

## 2014-03-05 DIAGNOSIS — R45851 Suicidal ideations: Secondary | ICD-10-CM

## 2014-03-05 DIAGNOSIS — E785 Hyperlipidemia, unspecified: Secondary | ICD-10-CM | POA: Diagnosis not present

## 2014-03-05 DIAGNOSIS — Z87891 Personal history of nicotine dependence: Secondary | ICD-10-CM | POA: Diagnosis not present

## 2014-03-05 DIAGNOSIS — Z9841 Cataract extraction status, right eye: Secondary | ICD-10-CM | POA: Diagnosis not present

## 2014-03-05 DIAGNOSIS — Q211 Atrial septal defect: Secondary | ICD-10-CM | POA: Diagnosis not present

## 2014-03-05 DIAGNOSIS — I5042 Chronic combined systolic (congestive) and diastolic (congestive) heart failure: Secondary | ICD-10-CM | POA: Insufficient documentation

## 2014-03-05 DIAGNOSIS — Z8619 Personal history of other infectious and parasitic diseases: Secondary | ICD-10-CM | POA: Insufficient documentation

## 2014-03-05 DIAGNOSIS — I129 Hypertensive chronic kidney disease with stage 1 through stage 4 chronic kidney disease, or unspecified chronic kidney disease: Secondary | ICD-10-CM | POA: Insufficient documentation

## 2014-03-05 DIAGNOSIS — I4891 Unspecified atrial fibrillation: Secondary | ICD-10-CM | POA: Diagnosis not present

## 2014-03-05 DIAGNOSIS — E781 Pure hyperglyceridemia: Secondary | ICD-10-CM | POA: Insufficient documentation

## 2014-03-05 DIAGNOSIS — M25511 Pain in right shoulder: Secondary | ICD-10-CM | POA: Diagnosis present

## 2014-03-05 DIAGNOSIS — R404 Transient alteration of awareness: Secondary | ICD-10-CM | POA: Diagnosis not present

## 2014-03-05 DIAGNOSIS — Z8546 Personal history of malignant neoplasm of prostate: Secondary | ICD-10-CM | POA: Insufficient documentation

## 2014-03-05 DIAGNOSIS — R531 Weakness: Secondary | ICD-10-CM | POA: Diagnosis not present

## 2014-03-05 DIAGNOSIS — Z862 Personal history of diseases of the blood and blood-forming organs and certain disorders involving the immune mechanism: Secondary | ICD-10-CM | POA: Diagnosis not present

## 2014-03-05 DIAGNOSIS — M25519 Pain in unspecified shoulder: Secondary | ICD-10-CM | POA: Diagnosis not present

## 2014-03-05 LAB — CBC WITH DIFFERENTIAL/PLATELET
BASOS PCT: 0 % (ref 0–1)
Basophils Absolute: 0 10*3/uL (ref 0.0–0.1)
EOS ABS: 0.1 10*3/uL (ref 0.0–0.7)
EOS PCT: 1 % (ref 0–5)
HEMATOCRIT: 45.6 % (ref 39.0–52.0)
HEMOGLOBIN: 15.1 g/dL (ref 13.0–17.0)
LYMPHS ABS: 2.4 10*3/uL (ref 0.7–4.0)
LYMPHS PCT: 25 % (ref 12–46)
MCH: 29.6 pg (ref 26.0–34.0)
MCHC: 33.1 g/dL (ref 30.0–36.0)
MCV: 89.4 fL (ref 78.0–100.0)
Monocytes Absolute: 0.8 10*3/uL (ref 0.1–1.0)
Monocytes Relative: 8 % (ref 3–12)
NEUTROS ABS: 6.4 10*3/uL (ref 1.7–7.7)
NEUTROS PCT: 66 % (ref 43–77)
PLATELETS: 190 10*3/uL (ref 150–400)
RBC: 5.1 MIL/uL (ref 4.22–5.81)
RDW: 15.6 % — ABNORMAL HIGH (ref 11.5–15.5)
WBC: 9.7 10*3/uL (ref 4.0–10.5)

## 2014-03-05 LAB — RAPID URINE DRUG SCREEN, HOSP PERFORMED
AMPHETAMINES: NOT DETECTED
BENZODIAZEPINES: NOT DETECTED
Barbiturates: NOT DETECTED
COCAINE: NOT DETECTED
Opiates: NOT DETECTED
TETRAHYDROCANNABINOL: NOT DETECTED

## 2014-03-05 LAB — COMPREHENSIVE METABOLIC PANEL
ALT: 45 U/L (ref 0–53)
AST: 36 U/L (ref 0–37)
Albumin: 4.3 g/dL (ref 3.5–5.2)
Alkaline Phosphatase: 84 U/L (ref 39–117)
Anion gap: 9 (ref 5–15)
BUN: 15 mg/dL (ref 6–23)
CHLORIDE: 105 mmol/L (ref 96–112)
CO2: 23 mmol/L (ref 19–32)
CREATININE: 0.88 mg/dL (ref 0.50–1.35)
Calcium: 10 mg/dL (ref 8.4–10.5)
GFR calc Af Amer: >90 mL/min (ref >90)
GFR calc non Af Amer: >90 mL/min (ref >90)
Glucose, Bld: 82 mg/dL (ref 70–99)
Potassium: 4.4 mmol/L (ref 3.5–5.1)
Sodium: 137 mmol/L (ref 135–145)
Total Bilirubin: 0.8 mg/dL (ref 0.3–1.2)
Total Protein: 7.7 g/dL (ref 6.0–8.3)

## 2014-03-05 LAB — ETHANOL: ALCOHOL ETHYL (B): 27 mg/dL — AB (ref 0–9)

## 2014-03-05 MED ORDER — CLONIDINE HCL 0.1 MG PO TABS
0.2000 mg | ORAL_TABLET | Freq: Once | ORAL | Status: AC
  Administered 2014-03-06: 0.2 mg via ORAL
  Filled 2014-03-05: qty 2

## 2014-03-05 MED ORDER — HYDROCODONE-ACETAMINOPHEN 5-325 MG PO TABS
2.0000 | ORAL_TABLET | Freq: Once | ORAL | Status: AC
  Administered 2014-03-05: 2 via ORAL
  Filled 2014-03-05: qty 2

## 2014-03-05 NOTE — BH Assessment (Addendum)
Tele Assessment Note    [name] [name] is an 59 y.o. male presenting to ED with pain in neck and shoulder. During the course of his ED interview pt reports he has been having SI and has been off his mental health medications. Pt is alert and oriented times 4. Pt reports depressed mood on an off for three months this episode getting progressively worse. He reports SI for the past week with plan to overdose, and reports he does not feel safe being left alone. Pt has depressed and anxious mood with flat affect. Speech is logical and coherent. He reports AVH that he is not able to make out, denies command hallucinations. Reports command and persecutory hallucinations in the past. Pt denies past suicide attempts, denies HI, denies self-harm. He reports he used cocaine about a week ago to "try to stop the voices."  Pt reports be has been dx with schizophrenia and hospitalized about 7 times since age 45. He reports he stopped taking his medication about two weeks ago, and developed SI about one week ago. Pt reports about a month ago he had a manic episode that last about a week, with decreased need for sleep, elevated, expansive mood, more risk taking, and more goal directed behavior. Pt currently reports depressed mood, decreased self-care, loss of appetite, loss of pleasure, trouble with concentration and motivation, and trouble initiating and maintaining sleep. Pt reports he has lost about 12 pounds, and does not feel safe being alone. Pt reports currently he is anxious as he is getting ready to move.   Pt reports hx of anxiety, worrying about things out of proportion to the situation. He denies phobias or OCD behaviors, denies PTSD and hx of abuse. He reports hx of persecutory delusions with AVH telling him others are out to get him, but is not experiencing that at present.   Pt has multiple health problems including chronic pain, high lood pressure, back pain. He reports he has had an allergic reaction to  thorazine.   Pt was vague about substance use. He report he drinks very seldomly and uses cocaine not very often. He had BAL of 27 upon admission. UDS is negative.  Pt denies family history of SA, MH concerns.   Axis I: 295.70 Schizoaffective Disorder Bipolar type  300.02 Generalized Anxiety Disorder  303.90 Alcohol Use Disorder, Rule Out  304.20 Cocaine Use Disorder, Rule out  Axis II: Deferred Axis III:  Past Medical History  Diagnosis Date  . GERD (gastroesophageal reflux disease)   . Schizophrenia, schizo-affective   . Cataract   . Hyperlipidemia   . Hypertension     uncontrolled with medication noncompliance  . Hepatitis C   . HIT (heparin-induced thrombocytopenia)   . Continuous chronic alcoholism   . H/O cocaine abuse   . Stroke 01/2012    Small cerebellar infarcts right greater than left as well as questionable acute left external capsule and caudate nuclear punctate lacunar infarcts noted per MRI (01/2012) - presumed to be embolic likely source PFO with right to left shunt (noted per TEE 01/ 2014)  . Chronic combined systolic and diastolic CHF (congestive heart failure)     a) EF 40-45% per 2D echo (02/2012) with grade 1 DD b)  NICM c) RHC (04/2012): RA: 4, RV 45/3/4, PA 42/9 (24), PCWP 14, Fick CO/CI: 5.2 /2.2, PVR 1.9 WU, PA 62% and 64% d) ECHO (10/2012) EF 40-45%, grade II DD, RV nl  . Degenerative lumbar spinal stenosis  s/p L2-3, L3-4, L4-5 laminectomy partial facetectomy, and bilateral foraminotomy  . History of pancreatitis 01/2011    Admission for acute pancreatitis presumed 2/2 ongoing alcohol abuse- and hypertriglyceridemia  . PFO (patent foramen ovale) 01/2012    with right to left shunt, noted per TEE in evaluation for source of embolic stroke in [id number]  . CKD (chronic kidney disease) stage 2, GFR 60-89 ml/min     BL SCr approximately 1-1.3  . 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 Hepatic  steatosis     suspected 2/2 alcohol abuse  . Splenic cyst   . Colitis 05/2009    History of colitis of ascending colon noted on CT abd/pelvis (05/2009), with interval resolution with subsequent CT  . Hypertriglyceridemia   . Cancer     Prostate cancer-bx. 3 weeks ago  . NICM (nonischemic cardiomyopathy)     a. LHC (04/2012): nl arteries  . Atrial fibrillation    Axis IV: housing problems Axis V: 21-30 behavior considerably influenced by delusions or hallucinations OR serious impairment in judgment, communication OR inability to function in almost all areas  Past Medical History:  Past Medical History  Diagnosis Date  . GERD (gastroesophageal reflux disease)   . Schizophrenia, schizo-affective   . Cataract   . Hyperlipidemia   . Hypertension     uncontrolled with medication noncompliance  . Hepatitis C   . HIT (heparin-induced thrombocytopenia)   . Continuous chronic alcoholism   . H/O cocaine abuse   . Stroke 01/2012    Small cerebellar infarcts right greater than left as well as questionable acute left external capsule and caudate nuclear punctate lacunar infarcts noted per MRI (01/2012) - presumed to be embolic likely source PFO with right to left shunt (noted per TEE 01/ 2014)  . Chronic combined systolic and diastolic CHF (congestive heart failure)     a) EF 40-45% per 2D echo (02/2012) with grade 1 DD b)  NICM c) RHC (04/2012): RA: 4, RV 45/3/4, PA 42/9 (24), PCWP 14, Fick CO/CI: 5.2 /2.2, PVR 1.9 WU, PA 62% and 64% d) ECHO (10/2012) EF 40-45%, grade II DD, RV nl  . Degenerative lumbar spinal stenosis     s/p L2-3, L3-4, L4-5 laminectomy partial facetectomy, and bilateral foraminotomy  . History of pancreatitis 01/2011    Admission for acute pancreatitis presumed 2/2 ongoing alcohol abuse- and hypertriglyceridemia  . PFO (patent foramen ovale) 01/2012    with right to left shunt, noted per TEE in evaluation for source of embolic stroke in [id number]  . CKD (chronic kidney  disease) stage 2, GFR 60-89 ml/min     BL SCr approximately 1-1.3  . 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 Hepatic steatosis     suspected 2/2 alcohol abuse  . Splenic cyst   . Colitis 05/2009    History of colitis of ascending colon noted on CT abd/pelvis (05/2009), with interval resolution with subsequent CT  . Hypertriglyceridemia   . Cancer     Prostate cancer-bx. 3 weeks ago  . NICM (nonischemic cardiomyopathy)     a. LHC (04/2012): nl arteries  . Atrial fibrillation     Past Surgical History  Procedure Laterality Date  . L2-3, l3-4, l4-5 laminectomy, partial facetectomy    . Left achilles Right 2007  . Tonsillectomy    . Knee arthroscopy    . I&d extremity  03/20/2011  Procedure: IRRIGATION AND DEBRIDEMENT EXTREMITY;  Surgeon: Kerin Salen, MD;  Location: [facility];  Service: Orthopedics;  Laterality: Left;  I&D LEFT ACHILLIES TENDON  . Eye surgery      right eye  . Resection distal clavical  09/17/2011    Procedure: RESECTION DISTAL CLAVICAL;  Surgeon: Nita Sells, MD;  Location: [facility];  Service: Orthopedics;  Laterality: Right;  right shoulder arthroscopy with sad and open distal clavicle excision   . Esophagogastroduodenoscopy N/A 06/25/2012    Procedure: ESOPHAGOGASTRODUODENOSCOPY (EGD);  Surgeon: Milus Banister, MD;  Location: [facility];  Service: Endoscopy;  Laterality: N/A;  . Robot assisted laparoscopic radical prostatectomy N/A 12/16/2012    Procedure: ROBOTIC ASSISTED LAPAROSCOPIC PROSTATECTOMY ;  Surgeon: Ardis Hughs, MD;  Location: [facility];  Service: Urology;  Laterality: N/A;    Family History:  Family History  Problem Relation Age of Onset  . CAD Mother 29    deceased  . CAD Sister   . CAD Brother 14    died from MI at age 71yo  . Hypertension      Social History:  reports that he quit smoking about 12 years ago. His smoking use included Cigarettes.  He has a 15 pack-year smoking history. He has never used smokeless tobacco. He reports that he does not drink alcohol or use illicit drugs.  Additional Social History:  Alcohol / Drug Use Pain Medications: SEE PTA Prescriptions: SEE PTA, reports he stopped taking his Abilify about 2 weeks ago  Over the Counter: SEE PTA History of alcohol / drug use?: Yes (Previously dx with Alcohol dependence, reports using cocaine) Longest period of sobriety (when/how long): Unknown Negative Consequences of Use:  (denies) Withdrawal Symptoms:  (denies) Substance #1 Name of Substance 1: etoh 1 - Age of First Use: 15 1 - Amount (size/oz): "I don't know" 1 - Frequency: "very seldom" 1 - Duration: on and off for years 1 - Last Use / Amount: reports last use was Thursday, however BAL was 27 Substance #2 Name of Substance 2: cocaine 2 - Age of First Use: [age] 2 - Amount (size/oz): unknown 2 - Frequency: "Not very often" 2 - Duration: on and off for years 2 - Last Use / Amount: reports he used again about a week ago in an attempt to "Stop the voices" UDS is pending   CIWA: CIWA-Ar BP: (!) 196/119 mmHg Pulse Rate: 104 COWS:    PATIENT STRENGTHS: (choose at least two) Communication skills  Has used OP resources Reports sister is supportive   Allergies:  Allergies  Allergen Reactions  . Heparin Other (See Comments)    Documented HIT under problem list Pt states he's not allergic.  Marland Kitchen Thorazine [Chlorpromazine Hcl] Other (See Comments)    Body freezes up    Home Medications:  (Not in a hospital admission)  OB/GYN Status:  No LMP for male patient.  General Assessment Data Location of Assessment: [facility] Is this a Tele or Face-to-Face Assessment?: Face-to-Face Is this an Initial Assessment or a Re-assessment for this encounter?: Initial Assessment Living Arrangements: Alone Can pt return to current living arrangement?: Yes Admission Status: Voluntary Is patient capable of signing voluntary  admission?: Yes Transfer from: Home Referral Source: Self/Family/Friend     [place] Living Arrangements: Alone Name of Psychiatrist: Dr. Reece Levy  Name of Therapist: Dr. Magda Paganini  Education Status Is patient currently in school?: No Current Grade: NA Highest grade of school patient has completed: 12 Name  of school: NA Contact person: NA  Risk to self with the past 6 months Suicidal Ideation: Yes-Currently Present Suicidal Intent: Yes-Currently Present Is patient at risk for suicide?: Yes Suicidal Plan?: Yes-Currently Present Specify Current Suicidal Plan: pt is thinking of overdosing on medicaitons Access to Means: Yes Specify Access to Suicidal Means: medicaitons What has been your use of drugs/alcohol within the last 12 months?: Pt has hx of abusing etoh and cocaine. He was vague about current use of both, reporting he used cocaine last week to "stop voices" and that he last drank Thursday, BAL was 27 Previous Attempts/Gestures: No How many times?: 0 Other Self Harm Risks: none Triggers for Past Attempts: None known Intentional Self Injurious Behavior: None Family Suicide History: No Recent stressful life event(s): Other (Comment) (preparing to move) Persecutory voices/beliefs?: Yes Depression: Yes Depression Symptoms: Despondent, Insomnia, Tearfulness, Isolating, Fatigue, Guilt, Loss of interest in usual pleasures, Feeling worthless/self pity Substance abuse history and/or treatment for substance abuse?: Yes Suicide prevention information given to non-admitted patients: Yes  Risk to Others within the past 6 months Homicidal Ideation: No Thoughts of Harm to Others: No Current Homicidal Intent: No Current Homicidal Plan: No Access to Homicidal Means: No Identified Victim: none History of harm to others?: No Assessment of Violence: None Noted Violent Behavior Description: none Does patient have access to weapons?: No Criminal Charges Pending?: No Does  patient have a court date: No  Psychosis Hallucinations: Auditory, Visual (unable to make out) Delusions: None noted  Mental Status Report Appear/Hygiene: Disheveled Eye Contact: Good Motor Activity: Unremarkable Speech: Logical/coherent Level of Consciousness: Alert Mood: Depressed, Anxious Affect: Flat Anxiety Level: Moderate Thought Processes: Coherent, Relevant Judgement: Impaired Orientation: Person, Place, Time, Situation Obsessive Compulsive Thoughts/Behaviors: None  Cognitive Functioning Concentration: Decreased Memory: Recent Intact, Remote Intact IQ: Average Insight: Fair Impulse Control: Poor Appetite: Poor Weight Loss: 12 Weight Gain: 0 Sleep: Decreased Total Hours of Sleep: 2 (trouble initiating and maintaining sleep) Vegetative Symptoms: Decreased grooming, Not bathing  ADLScreening [facility] Assessment Services) Patient's cognitive ability adequate to safely complete daily activities?: Yes Patient able to express need for assistance with ADLs?: Yes Independently performs ADLs?: Yes (appropriate for developmental age)  Prior Inpatient Therapy Prior Inpatient Therapy: Yes Prior Therapy Dates: about 7 times since 16 Prior Therapy Facilty/Provider(s): Claudia Pollock, [facility]  Reason for Treatment: schizophrenia   Prior Outpatient Therapy Prior Outpatient Therapy: Yes Prior Therapy Dates: current Prior Therapy Facilty/Provider(s): [facility], Dr. Reece Levy  Reason for Treatment: Schizophrenia, depression   ADL Screening (condition at time of admission) Patient's cognitive ability adequate to safely complete daily activities?: Yes Is the patient deaf or have difficulty hearing?: No Does the patient have difficulty seeing, even when wearing glasses/contacts?: No Does the patient have difficulty concentrating, remembering, or making decisions?: No Patient able to express need for assistance with ADLs?: Yes Does the patient have difficulty dressing or  bathing?: No Independently performs ADLs?: Yes (appropriate for developmental age) Does the patient have difficulty walking or climbing stairs?: Yes (reports uses a cane sometimes) Weakness of Legs: None Weakness of Arms/Hands: None  Home Assistive Devices/Equipment Home Assistive Devices/Equipment: Cane (specify quad or straight) (straight cane, reports he uses it sometimes)    Abuse/Neglect Assessment (Assessment to be complete while patient is alone) Physical Abuse: Denies Verbal Abuse: Denies Sexual Abuse: Denies Exploitation of patient/patient's resources: Denies Self-Neglect: Denies Values / Beliefs Cultural Requests During Hospitalization: None Spiritual Requests During Hospitalization: None   Advance Directives (For Healthcare) Does patient have an advance  directive?: No Would patient like information on creating an advanced directive?: No - patient declined information    Additional Information 1:1 In Past 12 Months?: No CIRT Risk: No Elopement Risk: No Does patient have medical clearance?: Yes     Disposition:  Per Patriciaann Clan, PA pt meets criteria and can be admitted to [facility] pending bed availability. Per Luretha Murphy she will review pt chart to determine if there is an appropriate bed at this time. Per Luretha Murphy pt can be considered for [facility] once blood pressure is stable. Informed L. Alyse Low, PA-C of plan and she is in agreement.She will given him something to attempt to lower blood pressure but notes pt is likely at baseline with BP.   Per Larose Kells pt has been accepted to bed 501-1 under the care of Dr. Parke Poisson. Informed Alyse Low, PA-C of acceptance.   Lear Ng, [facility] Triage Specialist 03/05/2014 10:41 PM

## 2014-03-05 NOTE — ED Provider Notes (Signed)
CSN: [id number]     Arrival date & time 03/05/14  1821 History   First MD Initiated Contact with Patient 03/05/14 2020     Chief Complaint  Patient presents with  . Shoulder Pain  . Depression     (Consider location/radiation/quality/duration/timing/severity/associated sxs/prior Treatment) Patient is a 59 y.o. male presenting with shoulder pain and mental health disorder. The history is provided by the patient. No language interpreter was used.  Shoulder Pain Location:  Shoulder Time since incident:  3 days Injury: no   Shoulder location:  R shoulder Pain details:    Quality:  Aching   Radiates to:  Does not radiate   Severity:  Moderate   Onset quality:  Gradual   Duration:  3 days   Timing:  Constant Chronicity:  New Dislocation: no   Foreign body present:  No foreign bodies Prior injury to area:  No Relieved by:  Nothing Worsened by:  Nothing tried Ineffective treatments:  None tried Associated symptoms: no back pain   Mental Health Problem Presenting symptoms: depression   Degree of incapacity (severity):  Moderate Onset quality:  Gradual Timing:  Constant Progression:  Worsening Context: noncompliance   Context: not alcohol use   Relieved by:  Nothing Worsened by:  Nothing tried Risk factors: hx of mental illness   Pt reports he is not taking his medications.  Pt reports suicidal thoughts.  No current plan.   Past Medical History  Diagnosis Date  . GERD (gastroesophageal reflux disease)   . Schizophrenia, schizo-affective   . Cataract   . Hyperlipidemia   . Hypertension     uncontrolled with medication noncompliance  . Hepatitis C   . HIT (heparin-induced thrombocytopenia)   . Continuous chronic alcoholism   . H/O cocaine abuse   . Stroke 01/2012    Small cerebellar infarcts right greater than left as well as questionable acute left external capsule and caudate nuclear punctate lacunar infarcts noted per MRI (01/2012) - presumed to be embolic likely  source PFO with right to left shunt (noted per TEE 01/ 2014)  . Chronic combined systolic and diastolic CHF (congestive heart failure)     a) EF 40-45% per 2D echo (02/2012) with grade 1 DD b)  NICM c) RHC (04/2012): RA: 4, RV 45/3/4, PA 42/9 (24), PCWP 14, Fick CO/CI: 5.2 /2.2, PVR 1.9 WU, PA 62% and 64% d) ECHO (10/2012) EF 40-45%, grade II DD, RV nl  . Degenerative lumbar spinal stenosis     s/p L2-3, L3-4, L4-5 laminectomy partial facetectomy, and bilateral foraminotomy  . History of pancreatitis 01/2011    Admission for acute pancreatitis presumed 2/2 ongoing alcohol abuse- and hypertriglyceridemia  . PFO (patent foramen ovale) 01/2012    with right to left shunt, noted per TEE in evaluation for source of embolic stroke in 02/7251  . CKD (chronic kidney disease) stage 2, GFR 60-89 ml/min     BL SCr approximately 1-1.3  . 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 Hepatic steatosis     suspected 2/2 alcohol abuse  . Splenic cyst   . Colitis 05/2009    History of colitis of ascending colon noted on CT abd/pelvis (05/2009), with interval resolution with subsequent CT  . Hypertriglyceridemia   . Cancer     Prostate cancer-bx. 3 weeks ago  . NICM (nonischemic cardiomyopathy)     a. LHC (04/2012): nl arteries  . Atrial fibrillation    Past  Surgical History  Procedure Laterality Date  . L2-3, l3-4, l4-5 laminectomy, partial facetectomy    . Left achilles Right 2007  . Tonsillectomy    . Knee arthroscopy    . I&d extremity  03/20/2011    Procedure: IRRIGATION AND DEBRIDEMENT EXTREMITY;  Surgeon: Kerin Salen, MD;  Location: [facility];  Service: Orthopedics;  Laterality: Left;  I&D LEFT ACHILLIES TENDON  . Eye surgery      right eye  . Resection distal clavical  09/17/2011    Procedure: RESECTION DISTAL CLAVICAL;  Surgeon: Nita Sells, MD;  Location: [facility];  Service: Orthopedics;  Laterality: Right;   right shoulder arthroscopy with sad and open distal clavicle excision   . Esophagogastroduodenoscopy N/A 06/25/2012    Procedure: ESOPHAGOGASTRODUODENOSCOPY (EGD);  Surgeon: Milus Banister, MD;  Location: [facility];  Service: Endoscopy;  Laterality: N/A;  . Robot assisted laparoscopic radical prostatectomy N/A 12/16/2012    Procedure: ROBOTIC ASSISTED LAPAROSCOPIC PROSTATECTOMY ;  Surgeon: Ardis Hughs, MD;  Location: [facility];  Service: Urology;  Laterality: N/A;   Family History  Problem Relation Age of Onset  . CAD Mother 62    deceased  . CAD Sister   . CAD Brother 87    died from MI at age 53yo  . Hypertension     History  Substance Use Topics  . Smoking status: Former Smoker -- 0.50 packs/day for 30 years    Types: Cigarettes    Quit date: 06/24/2001  . Smokeless tobacco: Never Used  . Alcohol Use: No     Comment: Quit again 09/19/2013    Review of Systems  Musculoskeletal: Negative for back pain.  All other systems reviewed and are negative.     Allergies  Heparin and Thorazine  Home Medications   Prior to Admission medications   Medication Sig Start Date End Date Taking? Authorizing Provider  ARIPiprazole (ABILIFY) 20 MG tablet Take 20 mg by mouth at bedtime.   Yes Historical Provider, MD  carvedilol (COREG) 25 MG tablet Take 1 tablet (25 mg total) by mouth 2 (two) times daily with a meal. Patient taking differently: Take 25 mg by mouth daily.  08/01/13  Yes Amy D Clegg, NP  cloNIDine (CATAPRES) 0.1 MG tablet Take 1 tablet (0.1 mg total) by mouth daily. 10/03/13  Yes Rande Brunt, NP  CRESTOR 20 MG tablet take 1 tablet by mouth once daily 01/29/14  Yes Bartholomew Crews, MD  insulin aspart (NOVOLOG) 100 UNIT/ML injection Inject 5 Units into the skin 3 (three) times daily with meals. Patient taking differently: Inject 6 Units into the skin 3 (three) times daily with meals.  08/22/13  Yes Vijaya Mercer Pod, MD  Insulin Detemir (LEVEMIR FLEXTOUCH) 100  UNIT/ML Pen Inject 40 Units into the skin daily at 10 pm. 08/22/13  Yes Vijaya Mercer Pod, MD  lisinopril (PRINIVIL,ZESTRIL) 20 MG tablet Take 1 tablet (20 mg total) by mouth 2 (two) times daily. Patient taking differently: Take 20 mg by mouth daily.  05/31/13  Yes Rande Brunt, NP  metolazone (ZAROXOLYN) 2.5 MG tablet Take 2.5 mg by mouth daily as needed (for fluid).    Yes Historical Provider, MD  pantoprazole (PROTONIX) 40 MG tablet Take 40 mg by mouth daily.   Yes Historical Provider, MD  potassium chloride (K-DUR) 10 MEQ tablet Take one tab daily and Take extra tablet (10 meq) when take Metolazone Patient taking differently: Take 20 mEq by mouth 2 (two) times daily.  11/14/13  Yes Rande Brunt, NP  rivaroxaban (XARELTO) 20 MG TABS tablet Take 1 tablet (20 mg total) by mouth daily. MUST HAVE FOLLOW UP APPOINTMENT FOR FURTHER REFILLS 02/05/14  Yes Jolaine Artist, MD  spironolactone (ALDACTONE) 25 MG tablet Take 0.5 tablets (12.5 mg total) by mouth daily. 10/03/13  Yes Rande Brunt, NP  torsemide (DEMADEX) 20 MG tablet Take 40 mg by mouth 2 (two) times daily. 08/22/13  Yes Malena Catholic, MD  traZODone (DESYREL) 100 MG tablet Take 100 mg by mouth at bedtime as needed for sleep.  08/25/12  Yes Alejandro Paya, DO  XARTEMIS XR 7.5-325 MG TBCR Take 1 tablet by mouth 2 (two) times daily as needed (pain).  02/01/14  Yes Historical Provider, MD  ACCU-CHEK FASTCLIX LANCETS MISC Use as instructed to test four times a day. DX: 250.92, insulin requiring 05/25/13   Dominic Pea, DO  Blood Glucose Monitoring Suppl (ACCU-CHEK NANO SMARTVIEW) W/DEVICE KIT Use as instructed to test four times a day. DX: 250.92, insulin requiring 05/25/13   Dominic Pea, DO  glucose blood (ACCU-CHEK SMARTVIEW) test strip Use as instructed to test four times a day. DX: 250.92, insulin requiring 05/25/13   Dominic Pea, DO  Insulin Pen Needle (FIFTY50 PEN NEEDLES) 31G X 5 MM MISC Use to inject insulin up to 4 times  daily. Insulin dependent. diag code 250.00 05/16/13   Dominic Pea, DO  pantoprazole (PROTONIX) 40 MG tablet take 1 tablet by mouth twice a day Patient not taking: Reported on 03/05/2014 10/17/13   Nischal Narendra, MD   BP 196/119 mmHg  Pulse 104  Temp(Src) 99.5 F (37.5 C) (Oral)  Resp 18  SpO2 98% Physical Exam  Constitutional: He is oriented to person, place, and time. He appears well-developed and well-nourished.  HENT:  Head: Normocephalic.  Right Ear: External ear normal.  Left Ear: External ear normal.  Nose: Nose normal.  Mouth/Throat: Oropharynx is clear and moist.  Eyes: Conjunctivae and EOM are normal. Pupils are equal, round, and reactive to light.  Neck: Normal range of motion.  Cardiovascular: Normal rate and normal heart sounds.   Pulmonary/Chest: Effort normal and breath sounds normal.  Abdominal: Soft. He exhibits no distension.  Musculoskeletal: Normal range of motion.  Neurological: He is alert and oriented to person, place, and time.  Skin: Skin is warm.  Psychiatric: He has a normal mood and affect.  Nursing note and vitals reviewed.   ED Course  Procedures (including critical care time) Labs Review Labs Reviewed  CBC WITH DIFFERENTIAL/PLATELET - Abnormal; Notable for the following:    RDW 15.6 (*)    All other components within normal limits  ETHANOL - Abnormal; Notable for the following:    Alcohol, Ethyl (B) 27 (*)    All other components within normal limits  COMPREHENSIVE METABOLIC PANEL  URINE RAPID DRUG SCREEN (HOSP PERFORMED)    Imaging Review Dg Cervical Spine Complete  03/05/2014   CLINICAL DATA:  Right shoulder pain and right neck pain for 5 days without injury.  EXAM: CERVICAL SPINE  4+ VIEWS  COMPARISON:  Cervical spine CT from 02/24/2014  FINDINGS: The C6 level and below are obscured on the lateral projection due the patient's body habitus and shoulders.  Loss of intervertebral disc height at C4-5 and C5-6. Loss of the normal cervical  lordosis, which can be associated with muscle spasm. No visible fracture down to C6. No prevertebral soft tissue swelling.  On oblique views, there is multilevel osseous foraminal  impingement due to uncinate and facet spurring as better shown on the recent CT scan.  IMPRESSION: 1. Multilevel osseous foraminal impingement due to uncinate and facet spurring, as shown on the CT scan from 02/24/2014. 2. Loss of the normal cervical lordosis, which can be associated with muscle spasm. 3. Loss of intervertebral disc height at C4-5 and C5-6, as on recent CT scan. 4. Unsatisfactory visualization of C6 and below due to body habitus and the patient's shoulders. There is no gross malalignment but other than that, vertebral detail is obscured on the lateral projection in the lower cervical spine and cervicothoracic junction.   Electronically Signed   By: Sherryl Barters M.D.   On: 03/05/2014 21:17   Dg Shoulder Right  03/05/2014   CLINICAL DATA:  Pain in the superior aspect of the right shoulder extending to the right side of the neck for 5 days. No injury.  EXAM: RIGHT SHOULDER - 2+ VIEW  COMPARISON:  MRI right shoulder 11/28/2009  FINDINGS: Degenerative changes in the acromioclavicular and glenohumeral joints with joint space narrowing and osteophyte present. Small subacromial spur. No evidence of acute fracture or subluxation. No focal bone lesion or bone destruction. Bone cortex and trabecular architecture appear intact. No radiopaque soft tissue foreign bodies.  IMPRESSION: Degenerative changes.  No acute bony abnormalities.   Electronically Signed   By: Lucienne Capers M.D.   On: 03/05/2014 21:13     EKG Interpretation None      MDM   Final diagnoses:  Secondary osteoarthritis of right shoulder  Suicidal ideations  Depression        Fransico Meadow, PA-C 03/06/14 0011  Pamella Pert, MD 03/06/14 [phone]

## 2014-03-05 NOTE — ED Notes (Signed)
Brought in by EMS from home with c/o right shoulder pain and depression.  Pt reported that he has been having right shoulder pain for 3 days now--- pt denies trauma or injury.  Pt also c/o "emotionally tired"--- denies SI/HI.  Pt was diaphoretic on EMS' arrival at the scene--- CBG was 91 by EMS.  Pt has hx of HTN and schizophrenia--- has not been taking his psych meds .quite a while.  Pt presents to ED A/Ox4,  Complaints "not feeling well".

## 2014-03-05 NOTE — BH Assessment (Addendum)
Pt came in with pain in should and neck due to arthritis. He reports he is having some SI and has been off his medication. Per Alyse Low PA-C pt is medically cleared and can be assessed.   Assessment to commence shortly. Review of past ED visit does not indicate [facility] admission history. Does have and FYI regarding controlled substance due to testing positive for cocaine in 2014.    Lear Ng, [facility] Triage Specialist 03/05/2014 10:08 PM

## 2014-03-05 NOTE — ED Notes (Signed)
Bed: WA07 Expected date:  Expected time:  Means of arrival:  Comments: EMS, tachy, psych

## 2014-03-06 ENCOUNTER — Inpatient Hospital Stay (HOSPITAL_COMMUNITY)
Admission: EM | Admit: 2014-03-06 | Discharge: 2014-03-07 | DRG: 885 | Disposition: A | Discharge status: Home / Self Care | Payer: 59 | Source: Intra-hospital | Attending: Psychiatry | Admitting: Psychiatry

## 2014-03-06 ENCOUNTER — Encounter (HOSPITAL_COMMUNITY): Payer: Self-pay

## 2014-03-06 DIAGNOSIS — Z9114 Patient's other noncompliance with medication regimen: Secondary | ICD-10-CM | POA: Diagnosis present

## 2014-03-06 DIAGNOSIS — N182 Chronic kidney disease, stage 2 (mild): Secondary | ICD-10-CM | POA: Diagnosis not present

## 2014-03-06 DIAGNOSIS — Z8673 Personal history of transient ischemic attack (TIA), and cerebral infarction without residual deficits: Secondary | ICD-10-CM

## 2014-03-06 DIAGNOSIS — F1420 Cocaine dependence, uncomplicated: Secondary | ICD-10-CM | POA: Diagnosis present

## 2014-03-06 DIAGNOSIS — F1094 Alcohol use, unspecified with alcohol-induced mood disorder: Secondary | ICD-10-CM | POA: Diagnosis present

## 2014-03-06 DIAGNOSIS — K7460 Unspecified cirrhosis of liver: Secondary | ICD-10-CM | POA: Diagnosis present

## 2014-03-06 DIAGNOSIS — F1020 Alcohol dependence, uncomplicated: Secondary | ICD-10-CM | POA: Diagnosis present

## 2014-03-06 DIAGNOSIS — Z862 Personal history of diseases of the blood and blood-forming organs and certain disorders involving the immune mechanism: Secondary | ICD-10-CM | POA: Diagnosis not present

## 2014-03-06 DIAGNOSIS — F410 Panic disorder [episodic paroxysmal anxiety] without agoraphobia: Secondary | ICD-10-CM | POA: Diagnosis present

## 2014-03-06 DIAGNOSIS — F411 Generalized anxiety disorder: Secondary | ICD-10-CM | POA: Diagnosis present

## 2014-03-06 DIAGNOSIS — K219 Gastro-esophageal reflux disease without esophagitis: Secondary | ICD-10-CM | POA: Diagnosis present

## 2014-03-06 DIAGNOSIS — Z8619 Personal history of other infectious and parasitic diseases: Secondary | ICD-10-CM | POA: Diagnosis not present

## 2014-03-06 DIAGNOSIS — E119 Type 2 diabetes mellitus without complications: Secondary | ICD-10-CM

## 2014-03-06 DIAGNOSIS — Z794 Long term (current) use of insulin: Secondary | ICD-10-CM

## 2014-03-06 DIAGNOSIS — Z8249 Family history of ischemic heart disease and other diseases of the circulatory system: Secondary | ICD-10-CM | POA: Diagnosis not present

## 2014-03-06 DIAGNOSIS — F329 Major depressive disorder, single episode, unspecified: Secondary | ICD-10-CM | POA: Diagnosis present

## 2014-03-06 DIAGNOSIS — I4891 Unspecified atrial fibrillation: Secondary | ICD-10-CM | POA: Diagnosis present

## 2014-03-06 DIAGNOSIS — M4800 Spinal stenosis, site unspecified: Secondary | ICD-10-CM | POA: Diagnosis not present

## 2014-03-06 DIAGNOSIS — R32 Unspecified urinary incontinence: Secondary | ICD-10-CM | POA: Diagnosis not present

## 2014-03-06 DIAGNOSIS — I5022 Chronic systolic (congestive) heart failure: Secondary | ICD-10-CM

## 2014-03-06 DIAGNOSIS — Z6841 Body Mass Index (BMI) 40.0 and over, adult: Secondary | ICD-10-CM | POA: Diagnosis not present

## 2014-03-06 DIAGNOSIS — I429 Cardiomyopathy, unspecified: Secondary | ICD-10-CM | POA: Diagnosis present

## 2014-03-06 DIAGNOSIS — I5042 Chronic combined systolic (congestive) and diastolic (congestive) heart failure: Secondary | ICD-10-CM | POA: Diagnosis not present

## 2014-03-06 DIAGNOSIS — G4700 Insomnia, unspecified: Secondary | ICD-10-CM | POA: Diagnosis present

## 2014-03-06 DIAGNOSIS — F10239 Alcohol dependence with withdrawal, unspecified: Secondary | ICD-10-CM | POA: Diagnosis present

## 2014-03-06 DIAGNOSIS — F10932 Alcohol use, unspecified with withdrawal with perceptual disturbance: Secondary | ICD-10-CM | POA: Diagnosis present

## 2014-03-06 DIAGNOSIS — K760 Fatty (change of) liver, not elsewhere classified: Secondary | ICD-10-CM | POA: Diagnosis present

## 2014-03-06 DIAGNOSIS — Z23 Encounter for immunization: Secondary | ICD-10-CM | POA: Diagnosis not present

## 2014-03-06 DIAGNOSIS — Z8546 Personal history of malignant neoplasm of prostate: Secondary | ICD-10-CM

## 2014-03-06 DIAGNOSIS — E781 Pure hyperglyceridemia: Secondary | ICD-10-CM | POA: Diagnosis present

## 2014-03-06 DIAGNOSIS — M19211 Secondary osteoarthritis, right shoulder: Secondary | ICD-10-CM | POA: Diagnosis not present

## 2014-03-06 DIAGNOSIS — I129 Hypertensive chronic kidney disease with stage 1 through stage 4 chronic kidney disease, or unspecified chronic kidney disease: Secondary | ICD-10-CM | POA: Diagnosis not present

## 2014-03-06 DIAGNOSIS — Z9841 Cataract extraction status, right eye: Secondary | ICD-10-CM | POA: Diagnosis not present

## 2014-03-06 DIAGNOSIS — F209 Schizophrenia, unspecified: Secondary | ICD-10-CM | POA: Diagnosis not present

## 2014-03-06 DIAGNOSIS — E785 Hyperlipidemia, unspecified: Secondary | ICD-10-CM | POA: Diagnosis not present

## 2014-03-06 DIAGNOSIS — I10 Essential (primary) hypertension: Secondary | ICD-10-CM | POA: Diagnosis not present

## 2014-03-06 DIAGNOSIS — Z87891 Personal history of nicotine dependence: Secondary | ICD-10-CM | POA: Diagnosis not present

## 2014-03-06 DIAGNOSIS — Q211 Atrial septal defect: Secondary | ICD-10-CM | POA: Diagnosis not present

## 2014-03-06 DIAGNOSIS — Z79899 Other long term (current) drug therapy: Secondary | ICD-10-CM | POA: Diagnosis not present

## 2014-03-06 DIAGNOSIS — R45851 Suicidal ideations: Secondary | ICD-10-CM

## 2014-03-06 DIAGNOSIS — F10232 Alcohol dependence with withdrawal with perceptual disturbance: Secondary | ICD-10-CM | POA: Diagnosis present

## 2014-03-06 LAB — GLUCOSE, CAPILLARY
GLUCOSE-CAPILLARY: 89 mg/dL (ref 70–99)
Glucose-Capillary: 103 mg/dL — ABNORMAL HIGH (ref 70–99)
Glucose-Capillary: 100 mg/dL — ABNORMAL HIGH (ref 70–99)
Glucose-Capillary: 112 mg/dL — ABNORMAL HIGH (ref 70–99)

## 2014-03-06 MED ORDER — CHLORDIAZEPOXIDE HCL 25 MG PO CAPS
25.0000 mg | ORAL_CAPSULE | Freq: Three times a day (TID) | ORAL | Status: DC
  Administered 2014-03-07 (×2): 25 mg via ORAL
  Filled 2014-03-06 (×2): qty 1

## 2014-03-06 MED ORDER — SPIRONOLACTONE 12.5 MG HALF TABLET
12.5000 mg | ORAL_TABLET | Freq: Every day | ORAL | Status: DC
  Administered 2014-03-06 – 2014-03-07 (×2): 12.5 mg via ORAL
  Filled 2014-03-06 (×4): qty 1

## 2014-03-06 MED ORDER — LIDOCAINE 5 % EX PTCH
1.0000 | MEDICATED_PATCH | Freq: Every day | CUTANEOUS | Status: DC
  Administered 2014-03-06: 1 via TRANSDERMAL
  Filled 2014-03-06 (×5): qty 1

## 2014-03-06 MED ORDER — TRAZODONE HCL 50 MG PO TABS
50.0000 mg | ORAL_TABLET | Freq: Every evening | ORAL | Status: DC | PRN
  Filled 2014-03-06 (×2): qty 1

## 2014-03-06 MED ORDER — INSULIN ASPART 100 UNIT/ML ~~LOC~~ SOLN: 0.0000 [IU] | Freq: Every day | SUBCUTANEOUS | Status: DC

## 2014-03-06 MED ORDER — GABAPENTIN 100 MG PO CAPS
100.0000 mg | ORAL_CAPSULE | Freq: Three times a day (TID) | ORAL | Status: DC
  Administered 2014-03-06 – 2014-03-07 (×4): 100 mg via ORAL
  Filled 2014-03-06: qty 9
  Filled 2014-03-06 (×5): qty 1
  Filled 2014-03-06: qty 9
  Filled 2014-03-06 (×2): qty 1
  Filled 2014-03-06: qty 9
  Filled 2014-03-06 (×3): qty 1

## 2014-03-06 MED ORDER — QUETIAPINE FUMARATE 50 MG PO TABS
50.0000 mg | ORAL_TABLET | Freq: Every day | ORAL | Status: DC
  Administered 2014-03-06: 50 mg via ORAL
  Filled 2014-03-06: qty 1
  Filled 2014-03-06: qty 3
  Filled 2014-03-06 (×2): qty 1

## 2014-03-06 MED ORDER — ACETAMINOPHEN 325 MG PO TABS
650.0000 mg | ORAL_TABLET | Freq: Four times a day (QID) | ORAL | Status: DC | PRN
  Administered 2014-03-06: 650 mg via ORAL
  Filled 2014-03-06: qty 2

## 2014-03-06 MED ORDER — LOPERAMIDE HCL 2 MG PO CAPS: 2.0000 mg | ORAL_CAPSULE | ORAL | Status: DC | PRN

## 2014-03-06 MED ORDER — CARVEDILOL 25 MG PO TABS
25.0000 mg | ORAL_TABLET | Freq: Two times a day (BID) | ORAL | Status: DC
  Administered 2014-03-06 – 2014-03-07 (×3): 25 mg via ORAL
  Filled 2014-03-06: qty 2
  Filled 2014-03-06 (×7): qty 1

## 2014-03-06 MED ORDER — PANTOPRAZOLE SODIUM 40 MG PO TBEC
40.0000 mg | DELAYED_RELEASE_TABLET | Freq: Every day | ORAL | Status: DC
  Administered 2014-03-06 – 2014-03-07 (×2): 40 mg via ORAL
  Filled 2014-03-06 (×4): qty 1

## 2014-03-06 MED ORDER — THIAMINE HCL 100 MG/ML IJ SOLN
100.0000 mg | Freq: Once | INTRAMUSCULAR | Status: DC
  Filled 2014-03-06: qty 2

## 2014-03-06 MED ORDER — ONDANSETRON 4 MG PO TBDP: 4.0000 mg | ORAL_TABLET | Freq: Four times a day (QID) | ORAL | Status: DC | PRN

## 2014-03-06 MED ORDER — POTASSIUM CHLORIDE ER 10 MEQ PO TBCR
20.0000 meq | EXTENDED_RELEASE_TABLET | Freq: Two times a day (BID) | ORAL | Status: DC
  Administered 2014-03-06 – 2014-03-07 (×3): 20 meq via ORAL
  Filled 2014-03-06 (×7): qty 2

## 2014-03-06 MED ORDER — PNEUMOCOCCAL VAC POLYVALENT 25 MCG/0.5ML IJ INJ: 0.5000 mL | INJECTION | INTRAMUSCULAR | Status: DC

## 2014-03-06 MED ORDER — ALUM & MAG HYDROXIDE-SIMETH 200-200-20 MG/5ML PO SUSP: 30.0000 mL | ORAL | Status: DC | PRN

## 2014-03-06 MED ORDER — CLONIDINE HCL 0.1 MG PO TABS
0.1000 mg | ORAL_TABLET | Freq: Three times a day (TID) | ORAL | Status: DC
  Administered 2014-03-06 – 2014-03-07 (×6): 0.1 mg via ORAL
  Filled 2014-03-06 (×13): qty 1

## 2014-03-06 MED ORDER — CHLORDIAZEPOXIDE HCL 25 MG PO CAPS
25.0000 mg | ORAL_CAPSULE | Freq: Four times a day (QID) | ORAL | Status: AC
  Administered 2014-03-06 (×4): 25 mg via ORAL
  Filled 2014-03-06 (×4): qty 1

## 2014-03-06 MED ORDER — CHLORDIAZEPOXIDE HCL 25 MG PO CAPS: 25.0000 mg | ORAL_CAPSULE | Freq: Every day | ORAL | Status: DC

## 2014-03-06 MED ORDER — RIVAROXABAN 20 MG PO TABS
20.0000 mg | ORAL_TABLET | Freq: Every day | ORAL | Status: DC
  Administered 2014-03-06 – 2014-03-07 (×2): 20 mg via ORAL
  Filled 2014-03-06 (×4): qty 1

## 2014-03-06 MED ORDER — ROSUVASTATIN CALCIUM 20 MG PO TABS
20.0000 mg | ORAL_TABLET | Freq: Every day | ORAL | Status: DC
  Administered 2014-03-06: 20 mg via ORAL
  Filled 2014-03-06 (×3): qty 1

## 2014-03-06 MED ORDER — CHLORDIAZEPOXIDE HCL 25 MG PO CAPS
25.0000 mg | ORAL_CAPSULE | Freq: Four times a day (QID) | ORAL | Status: DC | PRN
  Administered 2014-03-06 – 2014-03-07 (×2): 25 mg via ORAL
  Filled 2014-03-06: qty 1

## 2014-03-06 MED ORDER — CARVEDILOL 25 MG PO TABS: 25.0000 mg | ORAL_TABLET | Freq: Two times a day (BID) | ORAL | Status: DC

## 2014-03-06 MED ORDER — CHLORDIAZEPOXIDE HCL 25 MG PO CAPS: 25.0000 mg | ORAL_CAPSULE | ORAL | Status: DC

## 2014-03-06 MED ORDER — ADULT MULTIVITAMIN W/MINERALS CH
1.0000 | ORAL_TABLET | Freq: Every day | ORAL | Status: DC
  Administered 2014-03-06 – 2014-03-07 (×2): 1 via ORAL
  Filled 2014-03-06 (×4): qty 1

## 2014-03-06 MED ORDER — INSULIN ASPART 100 UNIT/ML ~~LOC~~ SOLN: 0.0000 [IU] | Freq: Three times a day (TID) | SUBCUTANEOUS | Status: DC

## 2014-03-06 MED ORDER — CLONIDINE HCL 0.1 MG PO TABS: 0.1000 mg | ORAL_TABLET | Freq: Three times a day (TID) | ORAL | Status: DC

## 2014-03-06 MED ORDER — HYDROXYZINE HCL 25 MG PO TABS
25.0000 mg | ORAL_TABLET | Freq: Four times a day (QID) | ORAL | Status: DC | PRN
  Filled 2014-03-06: qty 6

## 2014-03-06 MED ORDER — VITAMIN B-1 100 MG PO TABS
100.0000 mg | ORAL_TABLET | Freq: Every day | ORAL | Status: DC
Start: 2014-03-07 — End: 2014-03-07
  Administered 2014-03-07: 100 mg via ORAL
  Filled 2014-03-06 (×3): qty 1

## 2014-03-06 MED ORDER — MAGNESIUM HYDROXIDE 400 MG/5ML PO SUSP
30.0000 mL | Freq: Every day | ORAL | Status: DC | PRN
  Administered 2014-03-06: 30 mL via ORAL
  Filled 2014-03-06: qty 30

## 2014-03-06 MED ORDER — INSULIN DETEMIR 100 UNIT/ML ~~LOC~~ SOLN: 40.0000 [IU] | Freq: Every day | SUBCUTANEOUS | Status: DC

## 2014-03-06 MED ORDER — IBUPROFEN 800 MG PO TABS
800.0000 mg | ORAL_TABLET | Freq: Once | ORAL | Status: AC
  Administered 2014-03-06: 800 mg via ORAL
  Filled 2014-03-06: qty 1

## 2014-03-06 MED ORDER — LISINOPRIL 20 MG PO TABS
20.0000 mg | ORAL_TABLET | Freq: Every day | ORAL | Status: DC
  Administered 2014-03-06 – 2014-03-07 (×2): 20 mg via ORAL
  Filled 2014-03-06 (×4): qty 1

## 2014-03-06 NOTE — Progress Notes (Signed)
CBG 100 

## 2014-03-06 NOTE — H&P (Signed)
Psychiatric Admission Assessment Adult  Patient Identification: [name] [name] MRN:  [id number] Date of Evaluation:  03/06/2014 Chief Complaint: "I was suicidal"        Principal Diagnosis: Schizophrenia Diagnosis: DSM5 Primary Psychiatric Diagnosis: Schizophrenia, multiple episodes ,currently in acute episode   Secondary Psychiatric Diagnosis: Alcohol use disorder ,severe Alcohol withdrawal with perceptual disturbances Stimulant use disorder ,moderate , cocaine type  Non Psychiatric Diagnosis:  see pmh Patient Active Problem List   Diagnosis Date Noted  . Schizophrenia [F20.9] 03/06/2014  . Alcohol use disorder, severe, dependence [F10.20] 03/06/2014  . Alcohol withdrawal with perceptual disturbances [F10.232] 03/06/2014  . Cocaine use disorder, moderate, dependence [F14.20] 03/06/2014  . A-fib [I48.91] 10/03/2013  . Ganglion cyst of wrist [M67.439] 02/06/2013  . Diabetes mellitus, type 2 [E11.9] 01/06/2013  . Chronic back pain greater than 3 months duration [M54.9, G89.29] 07/19/2012  . Cirrhosis of liver without mention of alcohol [K74.60] 06/25/2012  . Gastric ulcer with hemorrhage [K25.4] 06/25/2012  . Thrombocytopenia [D69.6] 06/24/2012  . CKD (chronic kidney disease) stage 2, GFR 60-89 ml/min [N18.2]   . Abnormality of gait [R26.9] 05/03/2012  . Chronic combined systolic and diastolic CHF (congestive heart failure) [I50.42] 04/07/2012  . CVA (cerebral infarction) [I63.9] 02/29/2012  . Transaminitis [R74.0] 02/21/2012  . HTN (hypertension) [I10] 02/20/2012  . GOUTY ARTHROPATHY UNSPECIFIED [M10.09] 10/26/2007  . HEPATITIS C [B17.10] 05/06/2007  . HYPERLIPIDEMIA [E78.5] 05/06/2007  . SUBSTANCE ABUSE, MULTIPLE [IMO0002] 05/06/2007  . PANCREATITIS, HX OF [Z87.19] 05/06/2007  . INSOMNIA [G47.00] 03/08/2007  . ARTHRITIS, LEFT SHOULDER [M12.9] 02/22/2007  . SCHIZOPHRENIA [F20.9] 10/18/2006           History of Present Illness::[name] [name] is an 59  y.o. AAmale presenting to ED initially with pain in neck and shoulder. Patient in the ED reported SI as well as being noncompliant on medications. Patient also reported depression as well as anxiety worsening since th epast few months . Patient also reported AVH , which he could not specify.  Patient seen this AM. Patient is a morbidly obese AAM , laying on his bed, reports dizziness as well as diaphoresis and tremors of his BL hands. Patient seen shaking his right hands while resting in bed. Patient reports that he was depressed as well as was hearing voices, which brought him to the ED. Patient reports that he sees Dr. Reece Levy at [facility] [address]. However the medications that he is on now for his schizophrenia , which is Abilify does not work anymore. Pt reports that his Psychiatrist was about to start him on seroquel recently ,but he came to the ED before he could.  Patient could not specify what voices he hear. He reports that the voices are mumbling.   Patient also reports sleep issues and reports trazodone as not effective.  Patient reports abusing ETOH on a daily basis, he drinks wine daily , but could not specify how much he drinks. Patient also uses cocaine, last use was a month ago.  Patient reports being in several hospitals for mental illness before ,[facility] as well as [facility]. He carries a diagnosis of schizophrenia.  Patient reports going to [facility] several times in the past.   Patient with several medical problems like CHF, Chronic back issues ,HTN.  Patient denies any past suicide attempts.  Pt is on disability . Pt lives in an apt byself. Pt used to be in the [employer] , but was discharged ,other than honorable due to leaving without permission.  Elements:  Location:  psychosis, depression,anxiety. Quality:  AH - mumbling, sleep issues,VH of some one peeking, depression, withdrawal sx from alcohol abuse - shakiness, diaphoresis,dizziness. Severity:  severe. Timing:   constant. Duration:  past few days. Context:  hx of schizophrenia as well as cocaine and ETOH abuse. Associated Signs/Symptoms: Depression Symptoms:  depressed mood, insomnia, psychomotor retardation, fatigue, feelings of worthlessness/guilt, difficulty concentrating, hopelessness, suicidal thoughts without plan, anxiety, panic attacks, loss of energy/fatigue, disturbed sleep, (Hypo) Manic Symptoms:  Distractibility, Impulsivity, Irritable Mood, Anxiety Symptoms:  Excessive Worry, Psychotic Symptoms:  Hallucinations: Auditory Visual PTSD Symptoms: NA Total Time spent with patient: 1 hour  Past Medical History:  Past Medical History  Diagnosis Date  . GERD (gastroesophageal reflux disease)   . Schizophrenia, schizo-affective   . Cataract   . Hyperlipidemia   . Hypertension     uncontrolled with medication noncompliance  . HIT (heparin-induced thrombocytopenia)   . Continuous chronic alcoholism   . H/O cocaine abuse   . Stroke 01/2012    Small cerebellar infarcts right greater than left as well as questionable acute left external capsule and caudate nuclear punctate lacunar infarcts noted per MRI (01/2012) - presumed to be embolic likely source PFO with right to left shunt (noted per TEE 01/ 2014)  . Chronic combined systolic and diastolic CHF (congestive heart failure)     a) EF 40-45% per 2D echo (02/2012) with grade 1 DD b)  NICM c) RHC (04/2012): RA: 4, RV 45/3/4, PA 42/9 (24), PCWP 14, Fick CO/CI: 5.2 /2.2, PVR 1.9 WU, PA 62% and 64% d) ECHO (10/2012) EF 40-45%, grade II DD, RV nl  . Degenerative lumbar spinal stenosis     s/p L2-3, L3-4, L4-5 laminectomy partial facetectomy, and bilateral foraminotomy  . History of pancreatitis 01/2011    Admission for acute pancreatitis presumed 2/2 ongoing alcohol abuse- and hypertriglyceridemia  . PFO (patent foramen ovale) 01/2012    with right to left shunt, noted per TEE in evaluation for source of embolic stroke in 09/7351  .  CKD (chronic kidney disease) stage 2, GFR 60-89 ml/min     BL SCr approximately 1-1.3  . 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 Hepatic steatosis     suspected 2/2 alcohol abuse  . Splenic cyst   . Colitis 05/2009    History of colitis of ascending colon noted on CT abd/pelvis (05/2009), with interval resolution with subsequent CT  . Hypertriglyceridemia   . NICM (nonischemic cardiomyopathy)     a. LHC (04/2012): nl arteries  . Atrial fibrillation   . Cancer     Prostate cancer-bx. 3 weeks ago  . Hepatitis C     Past Surgical History  Procedure Laterality Date  . L2-3, l3-4, l4-5 laminectomy, partial facetectomy    . Left achilles Right 2007  . Tonsillectomy    . Knee arthroscopy    . I&d extremity  03/20/2011    Procedure: IRRIGATION AND DEBRIDEMENT EXTREMITY;  Surgeon: Kerin Salen, MD;  Location: [facility];  Service: Orthopedics;  Laterality: Left;  I&D LEFT ACHILLIES TENDON  . Eye surgery      right eye  . Resection distal clavical  09/17/2011    Procedure: RESECTION DISTAL CLAVICAL;  Surgeon: Nita Sells, MD;  Location: [facility];  Service: Orthopedics;  Laterality: Right;  right shoulder arthroscopy with sad and open distal clavicle excision   . Esophagogastroduodenoscopy N/A 06/25/2012  Procedure: ESOPHAGOGASTRODUODENOSCOPY (EGD);  Surgeon: Milus Banister, MD;  Location: [facility];  Service: Endoscopy;  Laterality: N/A;  . Robot assisted laparoscopic radical prostatectomy N/A 12/16/2012    Procedure: ROBOTIC ASSISTED LAPAROSCOPIC PROSTATECTOMY ;  Surgeon: Ardis Hughs, MD;  Location: [facility];  Service: Urology;  Laterality: N/A;   Family History:  Family History  Problem Relation Age of Onset  . CAD Mother 103    deceased  . CAD Sister   . CAD Brother 73    died from MI at age 101yo  . Hypertension     Social History:  History  Alcohol Use  . 1.8 - 2.4 oz/week  . 0 Not  specified, 3-4 Glasses of wine per week    Comment: about 1/5 liquor 2-3 days a week     History  Drug Use  . Yes  . Special: "Crack" cocaine    Comment: history of cocaine abuse- none in 6 months(reminded not to use)    History   Social History  . Marital Status: Single    Spouse Name: N/A    Number of Children: N/A  . Years of Education: 12th grade   Occupational History  . Disability     2/2 schizophrenia   Social History Main Topics  . Smoking status: Former Smoker -- 0.50 packs/day for 30 years    Types: Cigarettes    Quit date: 06/24/2001  . Smokeless tobacco: Never Used  . Alcohol Use: 1.8 - 2.4 oz/week    0 Not specified, 3-4 Glasses of wine per week     Comment: about 1/5 liquor 2-3 days a week  . Drug Use: Yes    Special: "Crack" cocaine     Comment: history of cocaine abuse- none in 6 months(reminded not to use)  . Sexual Activity: None   Other Topics Concern  . None   Social History Narrative   Lives in [place] alone, has a [place] aide that helps with medications 4-5 days a week with medications, helping to clean.   Additional Social History:     Patient was born in [place] by mother. His mother passed away. Patient has sisters and brothers who are supportive. Patient went up to 12 th grade. Patient used to be in the [employer] in the past, left without permission. Patient denies being married , pt denies having children. Pt is on SSD. Patient denies hx of sexual or physical abuse.                      Musculoskeletal: Strength & Muscle Tone: within normal limits Gait & Station: normal Patient leans: N/A  Psychiatric Specialty Exam: Physical Exam  Constitutional: He is oriented to person, place, and time. He appears well-developed and well-nourished.  HENT:  Head: Normocephalic and atraumatic.  Eyes: Conjunctivae are normal. Pupils are equal, round, and reactive to light.  Neck: Normal range of motion.  Cardiovascular: Normal rate.    Respiratory: Effort normal.  GI: Soft.  Musculoskeletal: Normal range of motion.  Neurological: He is alert and oriented to person, place, and time.  Skin: Skin is warm.  Psychiatric: His speech is normal. His mood appears anxious. His affect is blunt. He is withdrawn and actively hallucinating. Cognition and memory are impaired. He expresses impulsivity. He exhibits a depressed mood. He expresses no homicidal and no suicidal ideation.    Review of Systems  Constitutional: Positive for malaise/fatigue and diaphoresis.  HENT: Negative.   Eyes: Negative.  Respiratory: Negative.   Cardiovascular: Negative.   Genitourinary: Negative.   Musculoskeletal: Positive for myalgias, back pain and joint pain.  Skin: Negative.   Neurological: Positive for dizziness, tremors and weakness.  Psychiatric/Behavioral: Positive for depression, hallucinations and substance abuse. The patient is nervous/anxious and has insomnia.     Blood pressure 149/75, pulse 65, temperature 97.9 F (36.6 C), temperature source Oral, resp. rate 17, height $RemoveBe'5\' 9"'NfPkuKeVR$  (1.753 m), weight 123.378 kg (272 lb).Body mass index is 40.15 kg/(m^2).  General Appearance: Casual  Eye Contact::  Good  Speech:  Clear and Coherent  Volume:  Normal  Mood:  Anxious and Depressed  Affect:  Labile  Thought Process:  Coherent  Orientation:  Full (Time, Place, and Person)  Thought Content:  Hallucinations: Auditory Visual  Suicidal Thoughts:  yes on admission , currently denies  Homicidal Thoughts:  No  Memory:  Immediate;   Fair Recent;   Fair Remote;   Poor  Judgement:  Impaired  Insight:  Lacking  Psychomotor Activity:  Restlessness and Tremor  Concentration:  Fair  Recall:  [employer] of Knowledge:Fair  Language: Fair  Akathisia:  No  Handed:  Right  AIMS (if indicated):     Assets:  Desire for Improvement  ADL's:  Intact  Cognition: WNL  Sleep:  Number of Hours: 1.25   Risk to Self: Is patient at risk for suicide?:  Yes Risk to Others:  yes ,due to psychosis Prior Inpatient Therapy:  several times -[facility] ,[place] Prior Outpatient Therapy:  Dr.Reddy , [address]  Alcohol Screening: 1. How often do you have a drink containing alcohol?: 2 to 3 times a week 2. How many drinks containing alcohol do you have on a typical day when you are drinking?: 3 or 4 (1/5 of liquor) 3. How often do you have six or more drinks on one occasion?: Never Preliminary Score: 1 4. How often during the last year have you found that you were not able to stop drinking once you had started?: Never 5. How often during the last year have you failed to do what was normally expected from you becasue of drinking?: Monthly 6. How often during the last year have you needed a first drink in the morning to get yourself going after a heavy drinking session?: Never 7. How often during the last year have you had a feeling of guilt of remorse after drinking?: Less than monthly 8. How often during the last year have you been unable to remember what happened the night before because you had been drinking?: Never 9. Have you or someone else been injured as a result of your drinking?: No 10. Has a relative or friend or a doctor or another health worker been concerned about your drinking or suggested you cut down?: Yes, during the last year Alcohol Use Disorder Identification Test Final Score (AUDIT): 11 Brief Intervention: Yes  Allergies:   Allergies  Allergen Reactions  . Heparin Other (See Comments)    Documented HIT under problem list Pt states he's not allergic. " They told me not take it anymore"  . Thorazine [Chlorpromazine Hcl] Other (See Comments)    Body freezes up   Lab Results:  Results for orders placed or performed during the hospital encounter of 03/05/14 (from the past 48 hour(s))  Drug screen panel, emergency     Status: None   Collection Time: 03/05/14  9:00 PM  Result Value Ref Range   Opiates NONE DETECTED NONE  DETECTED  Cocaine NONE DETECTED NONE DETECTED   Benzodiazepines NONE DETECTED NONE DETECTED   Amphetamines NONE DETECTED NONE DETECTED   Tetrahydrocannabinol NONE DETECTED NONE DETECTED   Barbiturates NONE DETECTED NONE DETECTED    Comment:        DRUG SCREEN FOR MEDICAL PURPOSES ONLY.  IF CONFIRMATION IS NEEDED FOR ANY PURPOSE, NOTIFY LAB WITHIN 5 DAYS.        LOWEST DETECTABLE LIMITS FOR URINE DRUG SCREEN Drug Class       Cutoff (ng/mL) Amphetamine      1000 Barbiturate      200 Benzodiazepine   076 Tricyclics       226 Opiates          300 Cocaine          300 THC              50   CBC with Differential/Platelet     Status: Abnormal   Collection Time: 03/05/14  9:09 PM  Result Value Ref Range   WBC 9.7 4.0 - 10.5 K/uL   RBC 5.10 4.22 - 5.81 MIL/uL   Hemoglobin 15.1 13.0 - 17.0 g/dL   HCT 45.6 39.0 - 52.0 %   MCV 89.4 78.0 - 100.0 fL   MCH 29.6 26.0 - 34.0 pg   MCHC 33.1 30.0 - 36.0 g/dL   RDW 15.6 (H) 11.5 - 15.5 %   Platelets 190 150 - 400 K/uL   Neutrophils Relative % 66 43 - 77 %   Neutro Abs 6.4 1.7 - 7.7 K/uL   Lymphocytes Relative 25 12 - 46 %   Lymphs Abs 2.4 0.7 - 4.0 K/uL   Monocytes Relative 8 3 - 12 %   Monocytes Absolute 0.8 0.1 - 1.0 K/uL   Eosinophils Relative 1 0 - 5 %   Eosinophils Absolute 0.1 0.0 - 0.7 K/uL   Basophils Relative 0 0 - 1 %   Basophils Absolute 0.0 0.0 - 0.1 K/uL  Comprehensive metabolic panel     Status: None   Collection Time: 03/05/14  9:09 PM  Result Value Ref Range   Sodium 137 135 - 145 mmol/L   Potassium 4.4 3.5 - 5.1 mmol/L   Chloride 105 96 - 112 mmol/L   CO2 23 19 - 32 mmol/L   Glucose, Bld 82 70 - 99 mg/dL   BUN 15 6 - 23 mg/dL   Creatinine, Ser 0.88 0.50 - 1.35 mg/dL   Calcium 10.0 8.4 - 10.5 mg/dL   Total Protein 7.7 6.0 - 8.3 g/dL   Albumin 4.3 3.5 - 5.2 g/dL   AST 36 0 - 37 U/L   ALT 45 0 - 53 U/L   Alkaline Phosphatase 84 39 - 117 U/L   Total Bilirubin 0.8 0.3 - 1.2 mg/dL   GFR calc non Af Amer >90 >90  mL/min   GFR calc Af Amer >90 >90 mL/min    Comment: (NOTE) The eGFR has been calculated using the CKD EPI equation. This calculation has not been validated in all clinical situations. eGFR's persistently <90 mL/min signify possible Chronic Kidney Disease.    Anion gap 9 5 - 15  Ethanol     Status: Abnormal   Collection Time: 03/05/14  9:09 PM  Result Value Ref Range   Alcohol, Ethyl (B) 27 (H) 0 - 9 mg/dL    Comment:        LOWEST DETECTABLE LIMIT FOR SERUM ALCOHOL IS 11 mg/dL FOR MEDICAL PURPOSES ONLY  Current Medications: Current Facility-Administered Medications  Medication Dose Route Frequency Provider Last Rate Last Dose  . acetaminophen (TYLENOL) tablet 650 mg  650 mg Oral Q6H PRN Laverle Hobby, PA-C   650 mg at 03/06/14 0439  . alum & mag hydroxide-simeth (MAALOX/MYLANTA) 200-200-20 MG/5ML suspension 30 mL  30 mL Oral Q4H PRN Laverle Hobby, PA-C      . carvedilol (COREG) tablet 25 mg  25 mg Oral BID WC Laverle Hobby, PA-C   25 mg at 03/06/14 0439  . chlordiazePOXIDE (LIBRIUM) capsule 25 mg  25 mg Oral Q6H PRN Laverle Hobby, PA-C   25 mg at 03/06/14 0442  . chlordiazePOXIDE (LIBRIUM) capsule 25 mg  25 mg Oral QID Laverle Hobby, PA-C   25 mg at 03/06/14 1226   Followed by  . [START ON 03/07/2014] chlordiazePOXIDE (LIBRIUM) capsule 25 mg  25 mg Oral TID Laverle Hobby, PA-C       Followed by  . [START ON 03/08/2014] chlordiazePOXIDE (LIBRIUM) capsule 25 mg  25 mg Oral BH-qamhs Spencer E Simon, PA-C       Followed by  . [START ON 03/09/2014] chlordiazePOXIDE (LIBRIUM) capsule 25 mg  25 mg Oral Daily Laverle Hobby, PA-C      . cloNIDine (CATAPRES) tablet 0.1 mg  0.1 mg Oral TID Laverle Hobby, PA-C   0.1 mg at 03/06/14 1226  . gabapentin (NEURONTIN) capsule 100 mg  100 mg Oral TID Ursula Alert, MD      . hydrOXYzine (ATARAX/VISTARIL) tablet 25 mg  25 mg Oral Q6H PRN Laverle Hobby, PA-C      . insulin aspart (novoLOG) injection 0-20 Units  0-20 Units  Subcutaneous TID WC Laverle Hobby, PA-C   0 Units at 03/06/14 0847  . insulin aspart (novoLOG) injection 0-5 Units  0-5 Units Subcutaneous QHS Spencer E Simon, PA-C      . insulin detemir (LEVEMIR) injection 40 Units  40 Units Subcutaneous QHS Spencer E Simon, PA-C      . lisinopril (PRINIVIL,ZESTRIL) tablet 20 mg  20 mg Oral Daily Laverle Hobby, PA-C   20 mg at 03/06/14 6010  . loperamide (IMODIUM) capsule 2-4 mg  2-4 mg Oral PRN Laverle Hobby, PA-C      . magnesium hydroxide (MILK OF MAGNESIA) suspension 30 mL  30 mL Oral Daily PRN Laverle Hobby, PA-C   30 mL at 03/06/14 0828  . multivitamin with minerals tablet 1 tablet  1 tablet Oral Daily Laverle Hobby, PA-C   1 tablet at 03/06/14 9323  . ondansetron (ZOFRAN-ODT) disintegrating tablet 4 mg  4 mg Oral Q6H PRN Laverle Hobby, PA-C      . pantoprazole (PROTONIX) EC tablet 40 mg  40 mg Oral Daily Laverle Hobby, PA-C   40 mg at 03/06/14 5573  . [START ON 03/07/2014] pneumococcal 23 valent vaccine (PNU-IMMUNE) injection 0.5 mL  0.5 mL Intramuscular Tomorrow-1000 Olivianna Higley, MD      . potassium chloride (K-DUR) CR tablet 20 mEq  20 mEq Oral BID Laverle Hobby, PA-C   20 mEq at 03/06/14 0824  . QUEtiapine (SEROQUEL) tablet 50 mg  50 mg Oral QHS Lindzy Rupert, MD      . rivaroxaban (XARELTO) tablet 20 mg  20 mg Oral Daily Laverle Hobby, PA-C   20 mg at 03/06/14 2202  . rosuvastatin (CRESTOR) tablet 20 mg  20 mg Oral QHS Laverle Hobby, PA-C      .  spironolactone (ALDACTONE) tablet 12.5 mg  12.5 mg Oral Daily Laverle Hobby, PA-C   12.5 mg at 03/06/14 0102  . thiamine (B-1) injection 100 mg  100 mg Intramuscular Once Laverle Hobby, PA-C   100 mg at 03/06/14 0532  . [START ON 03/07/2014] thiamine (VITAMIN B-1) tablet 100 mg  100 mg Oral Daily Laverle Hobby, PA-C       PTA Medications: Prescriptions prior to admission  Medication Sig Dispense Refill Last Dose  . ACCU-CHEK FASTCLIX LANCETS MISC Use as instructed to test four  times a day. DX: 250.92, insulin requiring 204 each 2 03/05/2014 at Unknown time  . ARIPiprazole (ABILIFY) 20 MG tablet Take 20 mg by mouth at bedtime.   Past Month at Unknown time  . carvedilol (COREG) 25 MG tablet Take 1 tablet (25 mg total) by mouth 2 (two) times daily with a meal. (Patient taking differently: Take 25 mg by mouth daily. ) 60 tablet 6 03/05/2014 at Unknown time  . cloNIDine (CATAPRES) 0.1 MG tablet Take 1 tablet (0.1 mg total) by mouth daily. 30 tablet 3 03/05/2014 at Unknown time  . CRESTOR 20 MG tablet take 1 tablet by mouth once daily 90 tablet 3 03/05/2014 at Unknown time  . insulin aspart (NOVOLOG) 100 UNIT/ML injection Inject 5 Units into the skin 3 (three) times daily with meals. (Patient taking differently: Inject 6 Units into the skin 3 (three) times daily with meals. ) 10 mL 11 Past Month at Unknown time  . Insulin Detemir (LEVEMIR FLEXTOUCH) 100 UNIT/ML Pen Inject 40 Units into the skin daily at 10 pm. 15 mL 11 Past Week at Unknown time  . Insulin Pen Needle (FIFTY50 PEN NEEDLES) 31G X 5 MM MISC Use to inject insulin up to 4 times daily. Insulin dependent. diag code 250.00 150 each 5 Past Week at Unknown time  . lisinopril (PRINIVIL,ZESTRIL) 20 MG tablet Take 1 tablet (20 mg total) by mouth 2 (two) times daily. (Patient taking differently: Take 20 mg by mouth daily. ) 60 tablet 3 03/05/2014 at Unknown time  . rivaroxaban (XARELTO) 20 MG TABS tablet Take 1 tablet (20 mg total) by mouth daily. MUST HAVE FOLLOW UP APPOINTMENT FOR FURTHER REFILLS 30 tablet 1 03/05/2014 at Unknown time  . torsemide (DEMADEX) 20 MG tablet Take 40 mg by mouth 2 (two) times daily.   Past Week at Unknown time  . traZODone (DESYREL) 100 MG tablet Take 100 mg by mouth at bedtime as needed for sleep.    Past Week at Unknown time  . XARTEMIS XR 7.5-325 MG TBCR Take 1 tablet by mouth 2 (two) times daily as needed (pain).   0 Past Week at Unknown time  . Blood Glucose Calibration (OT ULTRA/FASTTK CNTRL SOLN) SOLN       . Blood Glucose Monitoring Suppl (ACCU-CHEK NANO SMARTVIEW) W/DEVICE KIT Use as instructed to test four times a day. DX: 250.92, insulin requiring 1 kit 0 Taking  . glucose blood (ACCU-CHEK SMARTVIEW) test strip Use as instructed to test four times a day. DX: 250.92, insulin requiring 150 each 2 Taking  . metolazone (ZAROXOLYN) 2.5 MG tablet Take 2.5 mg by mouth daily as needed (for fluid).    Past Week at Unknown time  . oxyCODONE-acetaminophen (PERCOCET) 10-325 MG per tablet   0   . pantoprazole (PROTONIX) 40 MG tablet take 1 tablet by mouth twice a day (Patient not taking: Reported on 03/05/2014) 60 tablet 2 Not Taking at Unknown time  . pantoprazole (  PROTONIX) 40 MG tablet Take 40 mg by mouth daily.   03/04/2014 at Unknown time  . potassium chloride (K-DUR) 10 MEQ tablet Take one tab daily and Take extra tablet (10 meq) when take Metolazone (Patient taking differently: Take 20 mEq by mouth 2 (two) times daily. ) 35 tablet 3 Past Week at Unknown time  . potassium chloride (K-DUR,KLOR-CON) 10 MEQ tablet   0   . spironolactone (ALDACTONE) 25 MG tablet Take 0.5 tablets (12.5 mg total) by mouth daily. 45 tablet 3 More than a month at Unknown time    Previous Psychotropic Medications: Abilify,trazodone  Substance Abuse History in the last 12 months:  Yes.  , ETOH,cocaine    Consequences of Substance Abuse: Medical Consequences:  recent admission,medical problems Legal Consequences:  DUIs Withdrawal Symptoms:   Diaphoresis Tremors  Results for orders placed or performed during the hospital encounter of 03/05/14 (from the past 72 hour(s))  Drug screen panel, emergency     Status: None   Collection Time: 03/05/14  9:00 PM  Result Value Ref Range   Opiates NONE DETECTED NONE DETECTED   Cocaine NONE DETECTED NONE DETECTED   Benzodiazepines NONE DETECTED NONE DETECTED   Amphetamines NONE DETECTED NONE DETECTED   Tetrahydrocannabinol NONE DETECTED NONE DETECTED   Barbiturates NONE DETECTED NONE  DETECTED    Comment:        DRUG SCREEN FOR MEDICAL PURPOSES ONLY.  IF CONFIRMATION IS NEEDED FOR ANY PURPOSE, NOTIFY LAB WITHIN 5 DAYS.        LOWEST DETECTABLE LIMITS FOR URINE DRUG SCREEN Drug Class       Cutoff (ng/mL) Amphetamine      1000 Barbiturate      200 Benzodiazepine   696 Tricyclics       295 Opiates          300 Cocaine          300 THC              50   CBC with Differential/Platelet     Status: Abnormal   Collection Time: 03/05/14  9:09 PM  Result Value Ref Range   WBC 9.7 4.0 - 10.5 K/uL   RBC 5.10 4.22 - 5.81 MIL/uL   Hemoglobin 15.1 13.0 - 17.0 g/dL   HCT 45.6 39.0 - 52.0 %   MCV 89.4 78.0 - 100.0 fL   MCH 29.6 26.0 - 34.0 pg   MCHC 33.1 30.0 - 36.0 g/dL   RDW 15.6 (H) 11.5 - 15.5 %   Platelets 190 150 - 400 K/uL   Neutrophils Relative % 66 43 - 77 %   Neutro Abs 6.4 1.7 - 7.7 K/uL   Lymphocytes Relative 25 12 - 46 %   Lymphs Abs 2.4 0.7 - 4.0 K/uL   Monocytes Relative 8 3 - 12 %   Monocytes Absolute 0.8 0.1 - 1.0 K/uL   Eosinophils Relative 1 0 - 5 %   Eosinophils Absolute 0.1 0.0 - 0.7 K/uL   Basophils Relative 0 0 - 1 %   Basophils Absolute 0.0 0.0 - 0.1 K/uL  Comprehensive metabolic panel     Status: None   Collection Time: 03/05/14  9:09 PM  Result Value Ref Range   Sodium 137 135 - 145 mmol/L   Potassium 4.4 3.5 - 5.1 mmol/L   Chloride 105 96 - 112 mmol/L   CO2 23 19 - 32 mmol/L   Glucose, Bld 82 70 - 99 mg/dL   BUN 15 6 -  23 mg/dL   Creatinine, Ser 0.88 0.50 - 1.35 mg/dL   Calcium 10.0 8.4 - 10.5 mg/dL   Total Protein 7.7 6.0 - 8.3 g/dL   Albumin 4.3 3.5 - 5.2 g/dL   AST 36 0 - 37 U/L   ALT 45 0 - 53 U/L   Alkaline Phosphatase 84 39 - 117 U/L   Total Bilirubin 0.8 0.3 - 1.2 mg/dL   GFR calc non Af Amer >90 >90 mL/min   GFR calc Af Amer >90 >90 mL/min    Comment: (NOTE) The eGFR has been calculated using the CKD EPI equation. This calculation has not been validated in all clinical situations. eGFR's persistently <90 mL/min  signify possible Chronic Kidney Disease.    Anion gap 9 5 - 15  Ethanol     Status: Abnormal   Collection Time: 03/05/14  9:09 PM  Result Value Ref Range   Alcohol, Ethyl (B) 27 (H) 0 - 9 mg/dL    Comment:        LOWEST DETECTABLE LIMIT FOR SERUM ALCOHOL IS 11 mg/dL FOR MEDICAL PURPOSES ONLY     Observation Level/Precautions:  Fall  Laboratory:  HbAIC lipid panel,tsh,ekg if not already done  Psychotherapy:  Individual and group  Medications:  As above  Consultations:  As needed  Discharge Concerns:  stability and safety       Psychological Evaluations: No   Treatment Plan Summary: Daily contact with patient to assess and evaluate symptoms and progress in treatment and Medication management   Patient will benefit from inpatient treatment and stabilization.  Estimated length of stay is 5-7 days.  Reviewed past medical records,treatment plan.    Will continue CIWA/librium protocol. Will start a trial of Seroquel 50 mg po qhs for sleep as well as AH. Will restart home medications where needed.   Will continue to monitor vitals ,medication compliance and treatment side effects while patient is here.  Will monitor for medical issues as well as call consult as needed.  Reviewed labs ,will order as needed.  CSW will start working on disposition.  Patient to participate in therapeutic milieu .       Medical Decision Making:  Review of Psycho-Social Stressors (1), Review or order clinical lab tests (1), Review and summation of old records (2), Established Problem, Worsening (2), Review or order medicine tests (1), Review of Medication Regimen & Side Effects (2) and Review of New Medication or Change in Dosage (2)  I certify that inpatient services furnished can reasonably be expected to improve the patient's condition.   Randie Tallarico 2/9/201612:35 PM

## 2014-03-06 NOTE — Progress Notes (Signed)
CBG at 0629 was 103.

## 2014-03-06 NOTE — Progress Notes (Signed)
CBG 89 

## 2014-03-06 NOTE — [facility] Suicide Risk Assessment (Signed)
[facility] Admission Suicide Risk Assessment   Nursing information obtained from:  Patient Demographic factors:  Male Current Mental Status:  Suicidal ideation indicated by patient Loss Factors:  NA Historical Factors:  NA Risk Reduction Factors:  Positive social support Total Time spent with patient: 30 minutes Principal Problem: Schizophrenia Diagnosis:   Patient Active Problem List   Diagnosis Date Noted  . Schizophrenia [F20.9] 03/06/2014  . Alcohol use disorder, severe, dependence [F10.20] 03/06/2014  . Alcohol withdrawal with perceptual disturbances [F10.232] 03/06/2014  . Cocaine use disorder, moderate, dependence [F14.20] 03/06/2014  . A-fib [I48.91] 10/03/2013  . Ganglion cyst of wrist [M67.439] 02/06/2013  . Diabetes mellitus, type 2 [E11.9] 01/06/2013  . Chronic back pain greater than 3 months duration [M54.9, G89.29] 07/19/2012  . Cirrhosis of liver without mention of alcohol [K74.60] 06/25/2012  . Gastric ulcer with hemorrhage [K25.4] 06/25/2012  . Thrombocytopenia [D69.6] 06/24/2012  . CKD (chronic kidney disease) stage 2, GFR 60-89 ml/min [N18.2]   . Abnormality of gait [R26.9] 05/03/2012  . Chronic combined systolic and diastolic CHF (congestive heart failure) [I50.42] 04/07/2012  . CVA (cerebral infarction) [I63.9] 02/29/2012  . Transaminitis [R74.0] 02/21/2012  . HTN (hypertension) [I10] 02/20/2012  . GOUTY ARTHROPATHY UNSPECIFIED [M10.09] 10/26/2007  . HEPATITIS C [B17.10] 05/06/2007  . HYPERLIPIDEMIA [E78.5] 05/06/2007  . SUBSTANCE ABUSE, MULTIPLE [IMO0002] 05/06/2007  . PANCREATITIS, HX OF [Z87.19] 05/06/2007  . INSOMNIA [G47.00] 03/08/2007  . ARTHRITIS, LEFT SHOULDER [M12.9] 02/22/2007  . SCHIZOPHRENIA [F20.9] 10/18/2006     Continued Clinical Symptoms:  Alcohol Use Disorder Identification Test Final Score (AUDIT): 11 The "Alcohol Use Disorders Identification Test", Guidelines for Use in Primary Care, Second Edition.  World Pharmacologist [facility]). Score  between 0-7:  no or low risk or alcohol related problems. Score between 8-15:  moderate risk of alcohol related problems. Score between 16-19:  high risk of alcohol related problems. Score 20 or above:  warrants further diagnostic evaluation for alcohol dependence and treatment.   CLINICAL FACTORS:   Alcohol/Substance Abuse/Dependencies Previous Psychiatric Diagnoses and Treatments Medical Diagnoses and Treatments/Surgeries   Musculoskeletal: Strength & Muscle Tone: within normal limits Gait & Station: unsteady Patient leans: N/A  Psychiatric Specialty Exam: Physical Exam  ROS  Blood pressure 149/75, pulse 65, temperature 97.9 F (36.6 C), temperature source Oral, resp. rate 17, height 5\' 9"  (1.753 m), weight 123.378 kg (272 lb).Body mass index is 40.15 kg/(m^2).   Please see H&P.     SUICIDE RISK:   Moderate:  Frequent suicidal ideation with limited intensity, and duration, some specificity in terms of plans, no associated intent, good self-control, limited dysphoria/symptomatology, some risk factors present, and identifiable protective factors, including available and accessible social support.  PLAN OF CARE: Please see H&P.   Medical Decision Making:  Review of Psycho-Social Stressors (1), Review or order clinical lab tests (1), Decision to obtain old records (1), Established Problem, Worsening (2), New Problem, with no additional work-up planned (3), Review of Last Therapy Session (1), Review of Medication Regimen & Side Effects (2) and Review of New Medication or Change in Dosage (2)  I certify that inpatient services furnished can reasonably be expected to improve the patient's condition.   Johni Narine MD 03/06/2014, 12:33 PM

## 2014-03-06 NOTE — Progress Notes (Signed)
Patient ID: [name] [name], male   DOB: 03-02-1955, 59 y.o.   MRN: [id number]  [facility] Wellness, 09:30am: DID NOT ATTEND  The focus of this group is to educate the patient on the purpose and policies of crisis stabilization and provide a format to answer questions about their admission.  The group details unit policies and expectations of patients while admitted.

## 2014-03-06 NOTE — [facility] Group Notes (Signed)
[place] LCSW Group Therapy  03/06/2014 , 2:34 PM   Type of Therapy:  Group Therapy  Participation Level: Invited.  Stated he did not feel physically well enough to attend  Summary of Progress/Problems: Today's group focused on the term Diagnosis.  Participants were asked to define the term, and then pronounce whether it is a negative, positive or neutral term.  [name] [name] B 03/06/2014 , 2:34 PM

## 2014-03-06 NOTE — Tx Team (Signed)
Initial Interdisciplinary Treatment Plan   PATIENT STRESSORS: Health problems Medication change or noncompliance Substance abuse   PATIENT STRENGTHS: Ability for insight General fund of knowledge Motivation for treatment/growth   PROBLEM LIST: Problem List/Patient Goals Date to be addressed Date deferred Reason deferred Estimated date of resolution  " increased depression" 2/9     " SI" 2/9                                                DISCHARGE CRITERIA:  Improved stabilization in mood, thinking, and/or behavior Medical problems require only outpatient monitoring Need for constant or close observation no longer present Verbal commitment to aftercare and medication compliance Withdrawal symptoms are absent or subacute and managed without 24-hour nursing intervention  PRELIMINARY DISCHARGE PLAN: Attend PHP/IOP Attend 12-step recovery group  PATIENT/FAMIILY INVOLVEMENT: This treatment plan has been presented to and reviewed with the patient, [name] [name].  The patient and family have been given the opportunity to ask questions and make suggestions.  [name] [name] A 03/06/2014, 7:36 AM

## 2014-03-06 NOTE — Progress Notes (Signed)
D: Patient is alert and oriented. Pt's mood and affect is "so-so," depressed, and blunted. Pt's eye contact is fair. Pt denies SI/HI and AVH at this time. Pt complains of constipation this morning stating "I haven't gone in about 5 days;" relief from PRN medication. Pt reports "shakiness," "sweaty" and "blurred" vision d/t withdrawal. Pt is not attending groups. A: Active listening by RN. Encouragement/Support provided to pt. PRN medication administered for constipation per providers orders (See MAR). Scheduled medications administered per providers orders (See MAR). 15 minute checks continued per protocol for patient safety.  R: Patient cooperative and receptive to nursing interventions. Pt remains safe.

## 2014-03-06 NOTE — Progress Notes (Addendum)
Pt is a 59 yr old vol admitted for SI with no plan. Pt reports having "bad thoughts to do something" to harm himself. Pt denied any stressors or life events that was related to his SI. Pt reports having SI for the past week. Pt reports having increased depression for the past 2 weeks. Pt also endorses isolation, insomnia, and a decreased appetite. Pt is able to verbally contract for safety at this time. Pt reports a past hx of prostate cancer, HTN, schizophrenia, CHF, stroke, Hep C, and IDDM. Pt also wears depends for some urinary incontinence r/t to hx of prostate cancer. Pt provided with Depends in his room. Pt reports drinking a 1/5 of liquor daily. Pt presented with a CIWA of 8 on admission. Pt was orientated to the unit's polices and procedures.

## 2014-03-06 NOTE — Plan of Care (Signed)
Problem: Ineffective individual coping Goal: STG: Patient will remain free from self harm Outcome: Progressing Patient remains free from self harm. 15 minute checks continued per protocol for patient safety.   Problem: Diagnosis: Increased Risk For Suicide Attempt Goal: STG-Patient Will Attend All Groups On The Unit Outcome: Not Progressing Patient is not attending unit groups today. Goal: STG-Patient Will Comply With Medication Regime Outcome: Progressing Patient has adhered to medication regimen today with ease.

## 2014-03-07 DIAGNOSIS — F209 Schizophrenia, unspecified: Secondary | ICD-10-CM | POA: Insufficient documentation

## 2014-03-07 LAB — GLUCOSE, CAPILLARY
GLUCOSE-CAPILLARY: 101 mg/dL — AB (ref 70–99)
Glucose-Capillary: 99 mg/dL (ref 70–99)

## 2014-03-07 LAB — LIPID PANEL
CHOLESTEROL: 151 mg/dL (ref 0–200)
HDL: 51 mg/dL (ref >39)
LDL CALC: 64 mg/dL (ref 0–99)
TRIGLYCERIDES: 182 mg/dL — AB (ref <150)
Total CHOL/HDL Ratio: 3 RATIO
VLDL: 36 mg/dL (ref 0–40)

## 2014-03-07 LAB — TSH: TSH: 1.856 u[IU]/mL (ref 0.350–4.500)

## 2014-03-07 MED ORDER — CLONIDINE HCL 0.1 MG PO TABS: 0.1000 mg | ORAL_TABLET | Freq: Three times a day (TID) | ORAL | Status: DC

## 2014-03-07 MED ORDER — INSULIN DETEMIR 100 UNIT/ML FLEXPEN: 40.0000 [IU] | PEN_INJECTOR | Freq: Every day | SUBCUTANEOUS | Status: DC

## 2014-03-07 MED ORDER — ACCU-CHEK NANO SMARTVIEW W/DEVICE KIT: PACK | Status: DC

## 2014-03-07 MED ORDER — SPIRONOLACTONE 25 MG PO TABS: 12.5000 mg | ORAL_TABLET | Freq: Every day | ORAL | Status: DC

## 2014-03-07 MED ORDER — LISINOPRIL 20 MG PO TABS: 20.0000 mg | ORAL_TABLET | Freq: Every day | ORAL | Status: DC

## 2014-03-07 MED ORDER — RIVAROXABAN 20 MG PO TABS: 20.0000 mg | ORAL_TABLET | Freq: Every day | ORAL | Status: DC

## 2014-03-07 MED ORDER — PANTOPRAZOLE SODIUM 40 MG PO TBEC: 40.0000 mg | DELAYED_RELEASE_TABLET | Freq: Every day | ORAL | Status: DC

## 2014-03-07 MED ORDER — POTASSIUM CHLORIDE ER 20 MEQ PO TBCR: 20.0000 meq | EXTENDED_RELEASE_TABLET | Freq: Two times a day (BID) | ORAL | Status: DC

## 2014-03-07 MED ORDER — ACCU-CHEK FASTCLIX LANCETS MISC: Status: DC

## 2014-03-07 MED ORDER — ROSUVASTATIN CALCIUM 20 MG PO TABS: 20.0000 mg | ORAL_TABLET | Freq: Every day | ORAL | Status: DC

## 2014-03-07 MED ORDER — GLUCOSE BLOOD VI STRP: ORAL_STRIP | Status: DC

## 2014-03-07 MED ORDER — QUETIAPINE FUMARATE 50 MG PO TABS: 50.0000 mg | ORAL_TABLET | Freq: Every day | ORAL | Status: DC

## 2014-03-07 MED ORDER — HYDROXYZINE HCL 25 MG PO TABS: 25.0000 mg | ORAL_TABLET | Freq: Four times a day (QID) | ORAL | Status: DC | PRN

## 2014-03-07 MED ORDER — GABAPENTIN 100 MG PO CAPS: 100.0000 mg | ORAL_CAPSULE | Freq: Three times a day (TID) | ORAL | Status: DC

## 2014-03-07 MED ORDER — CARVEDILOL 25 MG PO TABS: 25.0000 mg | ORAL_TABLET | Freq: Two times a day (BID) | ORAL | Status: DC

## 2014-03-07 NOTE — Progress Notes (Signed)
Patient ID: [name] [name], male   DOB: 24-Jan-1956, 59 y.o.   MRN: [id number] D: Patient in room on approach. Pt in bed and sleeping most of the evening. Pt detoxing from [place] and c/o tremors, and anxiety. Pt mood/affect is anxious and sad. Pt denies SI/HI/AVH. No acute distressed noted at this time.   A: Medications administered as prescribed. Emotional support given and will continue to monitor pt's progress for stabilization.  R: Patient remains safe and complaint with medications.

## 2014-03-07 NOTE — Tx Team (Signed)
Interdisciplinary Treatment Plan Update (Adult)  Date: 03/07/2014 Time Reviewed: 12:09 PM  Progress in Treatment:  Attending groups: No  Participating in groups: No   Taking medication as prescribed: Yes  Tolerating medication: Yes  Family/Significant othe contact made: Yes Patient understands diagnosis: Yes, AEB seeking help for SI.  Discussing patient identified problems/goals with staff: Yes  Medical problems stabilized or resolved: Yes  Denies suicidal/homicidal ideation: Yes Patient has not harmed self or Others: Yes  New problem(s) identified: N/A Discharge Plan or Barriers: Return home, follow up outpt   Additional comments:  Patient found out he had a misunderstanding about the length of his lease.  Originally thought he would need to find a new place in 2 weeks, but since hospitalization learned that he has until April 1st.  Based on this, symptoms disappeared, and requested d/c.  Denied withdrawal symptoms.  Spoke to sister who vouched that he is fine for d/c.  See suicide risck assessment for further info.    Reason for Continuation of Hospitalization:   Estimated length of stay: D/C today    Attendees:  Patient:  03/07/2014 12:09 PM   Family:  2/10/201612:09 PM   Physician: Dr. Shea Evans MD  2/10/201612:09 PM  Nursing: Chrystine Oiler  03/07/2014 12:09 PM  Clinical Social Worker: Roque Lias, [place]  2/10/201612:09 PM  Clinical Social Worker: Bonnye Fava, [place] Intern 2/10/201612:09 PM  Other:  2/10/201612:09 PM  Other:  2/10/201612:09 PM  Other:  2/10/201612:09 PM  Scribe for Treatment Team:  Bonnye Fava, [place] Intern 03/07/2014 12:09 PM

## 2014-03-07 NOTE — [facility] Suicide Risk Assessment (Signed)
[facility] Discharge Suicide Risk Assessment   Demographic Factors:  NA  Total Time spent with patient: 30 minutes  Musculoskeletal: Strength & Muscle Tone: within normal limits Gait & Station: normal Patient leans: N/A  Psychiatric Specialty Exam: Physical Exam  Review of Systems  Constitutional: Negative.   HENT: Negative.   Eyes: Negative.   Respiratory: Negative.   Cardiovascular: Negative.   Gastrointestinal: Negative.   Genitourinary: Negative.   Musculoskeletal: Negative.   Skin: Negative.   Neurological: Negative.   Psychiatric/Behavioral: Positive for substance abuse. Negative for depression, suicidal ideas and hallucinations. The patient is not nervous/anxious and does not have insomnia.     Blood pressure 148/91, pulse 66, temperature 98.7 F (37.1 C), temperature source Oral, resp. rate 20, height 5\' 9"  (1.753 m), weight 123.378 kg (272 lb).Body mass index is 40.15 kg/(m^2).  General Appearance: Casual  Eye Contact::  Fair  Speech:  Clear and [id number]  Volume:  Normal  Mood:  Euthymic  Affect:  Constricted  Thought Process:  Goal Directed  Orientation:  Full (Time, Place, and Person)  Thought Content:  WDL  Suicidal Thoughts:  No  Homicidal Thoughts:  No  Memory:  Immediate;   Fair Recent;   Fair Remote;   Fair  Judgement:  Impaired  Insight:  Shallow  Psychomotor Activity:  Normal  Concentration:  Fair  Recall:  [employer] of Knowledge:Fair  Language: Fair  Akathisia:  No  Handed:  Right  AIMS (if indicated):     Assets:  Communication Skills  Sleep:  Number of Hours: 5.5  Cognition: WNL  ADL's:  Intact   Have you used any form of tobacco in the last 30 days? (Cigarettes, Smokeless Tobacco, Cigars, and/or Pipes): No  Has this patient used any form of tobacco in the last 30 days? (Cigarettes, Smokeless Tobacco, Cigars, and/or Pipes) No  Mental Status Per Nursing Assessment::   On Admission:  Suicidal ideation indicated by patient  Current  Mental Status by Physician: patient denies SI/HI/AH/VH.  Loss Factors: PATIENT IS NOT MOTIVATED TO GET HELP  Historical Factors: NA  Risk Reduction Factors:   Positive coping skills or problem solving skills  Continued Clinical Symptoms:  Alcohol/Substance Abuse/Dependencies Previous Psychiatric Diagnoses and Treatments Medical Diagnoses and Treatments/Surgeries  Cognitive Features That Contribute To Risk:  Thought constriction (tunnel vision)    Suicide Risk:  Minimal: No identifiable suicidal ideation.  Patients presenting with no risk factors but with morbid ruminations; may be classified as minimal risk based on the severity of the depressive symptoms  Principal Problem: Schizophrenia   Discharge Diagnoses:   DSM5 Primary Psychiatric Diagnosis: Schizophrenia, multiple episodes ,currently in acute episode   Secondary Psychiatric Diagnosis: Alcohol use disorder ,severe Alcohol withdrawal with perceptual disturbances (resolved) Stimulant use disorder ,moderate , cocaine type  Non Psychiatric Diagnosis: see pmh   Patient Active Problem List   Diagnosis Date Noted  . Schizophrenia [F20.9] 03/06/2014  . Alcohol use disorder, severe, dependence [F10.20] 03/06/2014  . Alcohol withdrawal with perceptual disturbances [F10.232] 03/06/2014  . Cocaine use disorder, moderate, dependence [F14.20] 03/06/2014  . A-fib [I48.91] 10/03/2013  . Ganglion cyst of wrist [M67.439] 02/06/2013  . Diabetes mellitus, type 2 [E11.9] 01/06/2013  . Chronic back pain greater than 3 months duration [M54.9, G89.29] 07/19/2012  . Cirrhosis of liver without mention of alcohol [K74.60] 06/25/2012  . Gastric ulcer with hemorrhage [K25.4] 06/25/2012  . Thrombocytopenia [D69.6] 06/24/2012  . CKD (chronic kidney disease) stage 2, GFR 60-89 ml/min [N18.2]   .  Abnormality of gait [R26.9] 05/03/2012  . Chronic combined systolic and diastolic CHF (congestive heart failure) [I50.42] 04/07/2012  . CVA  (cerebral infarction) [I63.9] 02/29/2012  . Transaminitis [R74.0] 02/21/2012  . HTN (hypertension) [I10] 02/20/2012  . GOUTY ARTHROPATHY UNSPECIFIED [M10.09] 10/26/2007  . HEPATITIS C [B17.10] 05/06/2007  . HYPERLIPIDEMIA [E78.5] 05/06/2007  . SUBSTANCE ABUSE, MULTIPLE [IMO0002] 05/06/2007  . PANCREATITIS, HX OF [Z87.19] 05/06/2007  . INSOMNIA [G47.00] 03/08/2007  . ARTHRITIS, LEFT SHOULDER [M12.9] 02/22/2007  . SCHIZOPHRENIA [F20.9] 10/18/2006    Follow-up Information    Follow up with [facility] On 03/15/2014.   Why:  Thursday at 2:10 with Dr Clydene Pugh information:   Cathren Harsh [place]  [336] [phone]      Plan Of Care/Follow-up recommendations: Patient reports that he wants to be discharged , does not want to be in this program. Patient denies any withdrawal sx. CIWA reviewed. Activity:  No restrictions Diet:  Carb modified diet Tests:  as needed Other:  Follow up with aftercare as recommended.  Is patient on multiple antipsychotic therapies at discharge:  No   Has Patient had three or more failed trials of antipsychotic monotherapy by history:  No  Recommended Plan for Multiple Antipsychotic Therapies: NA    Issac Moure MD 03/07/2014, 12:29 PM

## 2014-03-07 NOTE — Progress Notes (Signed)
  [name] [name] [name] Adult Case Management Discharge Plan :  Will you be returning to the same living situation after discharge:  Yes,  Home At discharge, do you have transportation home?: Yes,  Bus  Do you have the ability to pay for your medications: Yes,  Insurance   Release of information consent forms completed and in the chart;  Patient's signature needed at discharge.  Patient to Follow up at: Follow-up Information    Follow up with [facility] On 03/15/2014.   Why:  Thursday at 2:10 with Dr [name] [name] information:   [name] [name] [place]  [[phone]] [phone]      Patient denies SI/HI: Yes,  Yes     Safety Planning and Suicide Prevention discussed: [name] [name],  [name] [name] [phone]  Have you used any form of tobacco in the last 30 days? (Cigarettes, Smokeless Tobacco, Cigars, and/or Pipes): No  Has patient been referred to the Quitline?: N/A patient is not a smoker  [name],[name] 03/07/2014, 12:16 PM

## 2014-03-07 NOTE — Progress Notes (Signed)
Inpatient Diabetes Program Recommendations  AACE/ADA: New Consensus Statement on Inpatient Glycemic Control (2013)  Target Ranges:  Prepandial:   less than 140 mg/dL      Peak postprandial:   less than 180 mg/dL (1-2 hours)      Critically ill patients:  140 - 180 mg/dL   Reason for Assessment: Diabetes Consult  Diabetes history: DM2 Outpatient Diabetes medications: Levemir 40 units QHS, Novolog 6 units tidwc Current orders for Inpatient glycemic control: Novolog resistant tidwc and hs  Results for ADEIN, TENOLD (MRN [id number]) as of 03/07/2014 12:11  Ref. Range 03/06/2014 06:29 03/06/2014 11:42 03/06/2014 16:51 03/06/2014 20:41 03/07/2014 06:20 03/07/2014 11:47  Glucose-Capillary Latest Range: 70-99 mg/dL 103 (H) 100 (H) 89 112 (H) 101 (H) 99  Results for ARKEL, CANCELLIERE (MRN [id number]) as of 03/07/2014 12:11  Ref. Range 01/23/2014 09:21  Hgb A1c MFr Bld Latest Range: <5.7 % 6.4   CBGs all within normal limits since admission. HgbA1C indicates good control prior to admission.  If am CBG > 180 mg/dL, begin low dose Levemir - 10 units QHS. Titrate as needed.  Will follow. Thank you. Lorenda Peck, RD, LDN, CDE Inpatient Diabetes Coordinator [phone]

## 2014-03-07 NOTE — [facility] Suicide Risk Assessment (Signed)
[name] [name] INPATIENT:  Family/Significant Other Suicide Prevention Education  Suicide Prevention Education:  Education Completed; [name] [name], [phone] has been identified by the patient as the family member/significant other with whom the patient will be residing, and identified as the person(s) who will aid the patient in the event of a mental health crisis (suicidal ideations/suicide attempt).  With written consent from the patient, the family member/significant other has been provided the following suicide prevention education, prior to the and/or following the discharge of the patient.  The suicide prevention education provided includes the following:  Suicide risk factors  Suicide prevention and interventions  National Suicide Hotline telephone number  [facility] assessment telephone number  [facility] Emergency Assistance [place] and/or Residential Mobile Crisis Unit telephone number  Request made of family/significant other to:  Remove weapons (e.g., guns, rifles, knives), all items previously/currently identified as safety concern.    Remove drugs/medications (over-the-counter, prescriptions, illicit drugs), all items previously/currently identified as a safety concern.  The family member/significant other verbalizes understanding of the suicide prevention education information provided.  The family member/significant other agrees to remove the items of safety concern listed above. [name] [name] (sister) stated the patient was stable for the past year. His sister is very supportive and involved his life. Recently, the pt has experienced a lot of stress. His sister had surgery and he is moving to a new apartment. He was unable to cope with these stressors and stopped taking his medications.   [name],[name] 03/07/2014, 11:56 AM

## 2014-03-07 NOTE — Progress Notes (Signed)
PT Cancellation Note  Patient Details Name: [name] [name] MRN: [id number] DOB: October 30, 1955  Cancelled Treatment:    Reason Eval/Treat Not Completed: Other (comment) Spoke with pt's nurse who stated pt getting ready to be discharged. She did ask the patient is he had any mobility concerns for [place] to address prior to his discharge and he stated he uses a cane at home and he should be fine from that standpoint. We will sign off at this time due to pt being discharged very soon.   Thank you,  Clide Dales, PT Pager: [phone] 03/07/2014  Clide Dales 03/07/2014, 2:40 PM

## 2014-03-07 NOTE — Progress Notes (Signed)
Discharge note: Pt received both written and verbal discharge instructions. Pt verbalized understanding of discharge instructions. Pt agreed to med regimen and f/u appt. Pt denied SI/HI/AVH at time of discharge. Pt received sample meds, prescriptions, belongings and a bus pass. Pt safely left [facility].

## 2014-03-07 NOTE — Discharge Summary (Signed)
Physician Discharge Summary Note  Patient:  [name] [name] is an 59 y.o., male MRN:  [id number] DOB:  1955-02-12 Patient phone:  [phone] (home)  Patient address:   [place] [address],  Total Time spent with patient: 30 minutes  Date of Admission:  03/06/2014 Date of Discharge: 03/07/2014  Reason for Admission:  Suicidal thoughts  Principal Problem: Schizophrenia Discharge Diagnoses: Patient Active Problem List   Diagnosis Date Noted  . Schizophrenia, unspecified type [F20.9]   . Schizophrenia [F20.9] 03/06/2014  . Alcohol use disorder, severe, dependence [F10.20] 03/06/2014  . Alcohol withdrawal with perceptual disturbances [F10.232] 03/06/2014  . Cocaine use disorder, moderate, dependence [F14.20] 03/06/2014  . A-fib [I48.91] 10/03/2013  . Ganglion cyst of wrist [M67.439] 02/06/2013  . Diabetes mellitus, type 2 [E11.9] 01/06/2013  . Chronic back pain greater than 3 months duration [M54.9, G89.29] 07/19/2012  . Cirrhosis of liver without mention of alcohol [K74.60] 06/25/2012  . Gastric ulcer with hemorrhage [K25.4] 06/25/2012  . Thrombocytopenia [D69.6] 06/24/2012  . CKD (chronic kidney disease) stage 2, GFR 60-89 ml/min [N18.2]   . Abnormality of gait [R26.9] 05/03/2012  . Chronic combined systolic and diastolic CHF (congestive heart failure) [I50.42] 04/07/2012  . CVA (cerebral infarction) [I63.9] 02/29/2012  . Transaminitis [R74.0] 02/21/2012  . HTN (hypertension) [I10] 02/20/2012  . GOUTY ARTHROPATHY UNSPECIFIED [M10.09] 10/26/2007  . HEPATITIS C [B17.10] 05/06/2007  . HYPERLIPIDEMIA [E78.5] 05/06/2007  . SUBSTANCE ABUSE, MULTIPLE [IMO0002] 05/06/2007  . PANCREATITIS, HX OF [Z87.19] 05/06/2007  . INSOMNIA [G47.00] 03/08/2007  . ARTHRITIS, LEFT SHOULDER [M12.9] 02/22/2007  . SCHIZOPHRENIA [F20.9] 10/18/2006   Musculoskeletal: Strength & Muscle Tone: within normal limits Gait & Station: normal Patient leans: N/A  Psychiatric Specialty  Exam: Physical Exam  Vitals reviewed. Psychiatric: His behavior is normal.    Review of Systems  Constitutional: Negative.   HENT: Negative.   Eyes: Negative.   Respiratory: Negative.   Cardiovascular: Negative.   Gastrointestinal: Negative.   Genitourinary: Negative.   Musculoskeletal: Negative.   Skin: Negative.   Neurological: Negative.   Endo/Heme/Allergies: Negative.   Psychiatric/Behavioral: Positive for depression. The patient is nervous/anxious.     Blood pressure 148/91, pulse 66, temperature 98.7 F (37.1 C), temperature source Oral, resp. rate 20, height _0  (1.753 m), weight 123.378 kg (272 lb).Body mass index is 40.15 kg/(m^2).   General Appearance: Casual  Eye Contact:: Fair  Speech: Clear and [id number]  Volume: Normal  Mood: Euthymic  Affect: Constricted  Thought Process: Goal Directed  Orientation: Full (Time, Place, and Person)  Thought Content: WDL  Suicidal Thoughts: No  Homicidal Thoughts: No  Memory: Immediate; Fair Recent; Fair Remote; Fair  Judgement: Impaired  Insight: Shallow  Psychomotor Activity: Normal  Concentration: Fair  Recall: Crowell  Language: Fair  Akathisia: No  Handed: Right  AIMS (if indicated):    Assets: Communication Skills  Sleep: Number of Hours: 5.5  Cognition: WNL  ADL's: Intact       Past Medical History:  Past Medical History  Diagnosis Date  . GERD (gastroesophageal reflux disease)   . Schizophrenia, schizo-affective   . Cataract   . Hyperlipidemia   . Hypertension     uncontrolled with medication noncompliance  . HIT (heparin-induced thrombocytopenia)   . Continuous chronic alcoholism   . H/O cocaine abuse   . Stroke 01/2012    Small cerebellar infarcts right greater than left as well as questionable acute left external capsule and caudate nuclear punctate lacunar  infarcts noted per MRI (01/2012) - presumed to be embolic  likely source PFO with right to left shunt (noted per TEE 01/ 2014)  . Chronic combined systolic and diastolic CHF (congestive heart failure)     a) EF 40-45% per 2D echo (02/2012) with grade 1 DD b)  NICM c) RHC (04/2012): RA: 4, RV 45/3/4, PA 42/9 (24), PCWP 14, Fick CO/CI: 5.2 /2.2, PVR 1.9 WU, PA 62% and 64% d) ECHO (10/2012) EF 40-45%, grade II DD, RV nl  . Degenerative lumbar spinal stenosis     s/p L2-3, L3-4, L4-5 laminectomy partial facetectomy, and bilateral foraminotomy  . History of pancreatitis 01/2011    Admission for acute pancreatitis presumed 2/2 ongoing alcohol abuse- and hypertriglyceridemia  . PFO (patent foramen ovale) 01/2012    with right to left shunt, noted per TEE in evaluation for source of embolic stroke in 06/2128  . CKD (chronic kidney disease) stage 2, GFR 60-89 ml/min     BL SCr approximately 1-1.3  . 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 Hepatic steatosis     suspected 2/2 alcohol abuse  . Splenic cyst   . Colitis 05/2009    History of colitis of ascending colon noted on CT abd/pelvis (05/2009), with interval resolution with subsequent CT  . Hypertriglyceridemia   . NICM (nonischemic cardiomyopathy)     a. LHC (04/2012): nl arteries  . Atrial fibrillation   . Cancer     Prostate cancer-bx. 3 weeks ago  . Hepatitis C     Past Surgical History  Procedure Laterality Date  . L2-3, l3-4, l4-5 laminectomy, partial facetectomy    . Left achilles Right 2007  . Tonsillectomy    . Knee arthroscopy    . I&d extremity  03/20/2011    Procedure: IRRIGATION AND DEBRIDEMENT EXTREMITY;  Surgeon: Kerin Salen, MD;  Location: [facility];  Service: Orthopedics;  Laterality: Left;  I&D LEFT ACHILLIES TENDON  . Eye surgery      right eye  . Resection distal clavical  09/17/2011    Procedure: RESECTION DISTAL CLAVICAL;  Surgeon: Nita Sells, MD;  Location: [facility];  Service:  Orthopedics;  Laterality: Right;  right shoulder arthroscopy with sad and open distal clavicle excision   . Esophagogastroduodenoscopy N/A 06/25/2012    Procedure: ESOPHAGOGASTRODUODENOSCOPY (EGD);  Surgeon: Milus Banister, MD;  Location: [facility];  Service: Endoscopy;  Laterality: N/A;  . Robot assisted laparoscopic radical prostatectomy N/A 12/16/2012    Procedure: ROBOTIC ASSISTED LAPAROSCOPIC PROSTATECTOMY ;  Surgeon: Ardis Hughs, MD;  Location: [facility];  Service: Urology;  Laterality: N/A;   Family History:  Family History  Problem Relation Age of Onset  . CAD Mother 27    deceased  . CAD Sister   . CAD Brother 83    died from MI at age 22yo  . Hypertension     Social History:  History  Alcohol Use  . 1.8 - 2.4 oz/week  . 0 Standard drinks or equivalent, 3-4 Glasses of wine per week    Comment: about 1/5 liquor 2-3 days a week     History  Drug Use  . Yes  . Special: "Crack" cocaine    Comment: history of cocaine abuse- none in 6 months(reminded not to use)    History   Social History  . Marital Status: Single    Spouse Name: N/A  . Number of Children: N/A  .  Years of Education: 12th grade   Occupational History  . Disability     2/2 schizophrenia   Social History Main Topics  . Smoking status: Former Smoker -- 0.50 packs/day for 30 years    Types: Cigarettes    Quit date: 06/24/2001  . Smokeless tobacco: Never Used  . Alcohol Use: 1.8 - 2.4 oz/week    0 Standard drinks or equivalent, 3-4 Glasses of wine per week     Comment: about 1/5 liquor 2-3 days a week  . Drug Use: Yes    Special: "Crack" cocaine     Comment: history of cocaine abuse- none in 6 months(reminded not to use)  . Sexual Activity: Not on file   Other Topics Concern  . None   Social History Narrative   Lives in [place] alone, has a [place] aide that helps with medications 4-5 days a week with medications, helping to clean.   Past Psychiatric History: Hospitalizations:   [facility] as well as [facility]  Outpatient Care:  Dr Reece Levy  Substance Abuse Care:  "several times before"  Self-Mutilation:  Suicidal Attempts:  history  Violent Behaviors:   Risk to Self: Is patient at risk for suicide?: Yes Risk to Others:   Prior Inpatient Therapy:   Prior Outpatient Therapy:    Level of Care:  OP  Hospital Course:   [name] [name] is an 59 y.o. AAmale presenting to ED initially with pain in neck and shoulder.  In the ED, he reported SI as well as being noncompliant on medications. Patient states that his depression and anxiety had been worsening in the past few months.  He described vague AVH but unable to elaborate on them.  Larwence could not specify what voices he hears other than they are "mumbling words".  He sees Dr. Reece Levy at [facility].  He was non-compliant with meds and specifically Abilify.  He states is not working anymore.  Patient also reports sleep issues but Trazodone is ineffective.  He also states that he drink on a daily basis and had used cocaine in the past.  Patient with several medical problems like CHF, Chronic back issues, HTN.  Patient was admitted for mood stabilization and to manage his medications.  His moods gradually improved with medication prescribed.  He tolerated and reported no adverse side effects.   At time of discharge, he rated both depression and anxiety levels to be manageable and minimal.  He was encouraged to attend groups daily to help deal better with feelings of loss, depression and etoh dependence.  Denies physiological concerns/SI/HI/AVH at time of discharge.    To note:  Pt is on disability . Pt lives in an apt byself. Pt used to be in the [employer] , but was discharged ,other than honorable due to leaving without permission  Consults:  psychiatry  Significant Diagnostic Studies:  labs: per ED  Discharge Vitals:   Blood pressure 148/91, pulse 66, temperature 98.7 F (37.1 C), temperature source Oral, resp.  rate 20, height _0  (1.753 m), weight 123.378 kg (272 lb). Body mass index is 40.15 kg/(m^2). Lab Results:   Results for orders placed or performed during the hospital encounter of 03/06/14 (from the past 72 hour(s))  Glucose, capillary     Status: Abnormal   Collection Time: 03/06/14  6:29 AM  Result Value Ref Range   Glucose-Capillary 103 (H) 70 - 99 mg/dL  Glucose, capillary     Status: Abnormal   Collection Time: 03/06/14 11:42  AM  Result Value Ref Range   Glucose-Capillary 100 (H) 70 - 99 mg/dL  Glucose, capillary     Status: None   Collection Time: 03/06/14  4:51 PM  Result Value Ref Range   Glucose-Capillary 89 70 - 99 mg/dL   Comment 1 Document in Chart    Comment 2 Repeat Test   Glucose, capillary     Status: Abnormal   Collection Time: 03/06/14  8:41 PM  Result Value Ref Range   Glucose-Capillary 112 (H) 70 - 99 mg/dL  Glucose, capillary     Status: Abnormal   Collection Time: 03/07/14  6:20 AM  Result Value Ref Range   Glucose-Capillary 101 (H) 70 - 99 mg/dL  Lipid panel     Status: Abnormal   Collection Time: 03/07/14  6:21 AM  Result Value Ref Range   Cholesterol 151 0 - 200 mg/dL   Triglycerides 182 (H) <150 mg/dL   HDL 51 >39 mg/dL   Total CHOL/HDL Ratio 3.0 RATIO   VLDL 36 0 - 40 mg/dL   LDL Cholesterol 64 0 - 99 mg/dL    Comment:        Total Cholesterol/HDL:CHD Risk Coronary Heart Disease Risk Table                     Men   Women  1/2 Average Risk   3.4   3.3  Average Risk       5.0   4.4  2 X Average Risk   9.6   7.1  3 X Average Risk  23.4   11.0        Use the calculated Patient Ratio above and the CHD Risk Table to determine the patient's CHD Risk.        ATP III CLASSIFICATION (LDL):  <100     mg/dL   Optimal  100-129  mg/dL   Near or Above                    Optimal  130-159  mg/dL   Borderline  160-189  mg/dL   High  >190     mg/dL   Very High Performed at [facility]   TSH     Status: None   Collection Time: 03/07/14  6:21  AM  Result Value Ref Range   TSH 1.856 0.350 - 4.500 uIU/mL    Comment: Performed at [facility]  Glucose, capillary     Status: None   Collection Time: 03/07/14 11:47 AM  Result Value Ref Range   Glucose-Capillary 99 70 - 99 mg/dL   Physical Findings: AIMS:  , ,  ,  ,    CIWA:  CIWA-Ar Total: 3 COWS:     See Psychiatric Specialty Exam and Suicide Risk Assessment completed by Attending Physician prior to discharge.  Discharge destination:  Home  Is patient on multiple antipsychotic therapies at discharge:  No   Has Patient had three or more failed trials of antipsychotic monotherapy by history:  No  Recommended Plan for Multiple Antipsychotic Therapies: NA    Medication List    STOP taking these medications        ARIPiprazole 20 MG tablet  Commonly known as:  ABILIFY     insulin aspart 100 UNIT/ML injection  Commonly known as:  novoLOG     Insulin Pen Needle 31G X 5 MM Misc  Commonly known as:  FIFTY50 PEN NEEDLES     metolazone 2.5  MG tablet  Commonly known as:  ZAROXOLYN     OT ULTRA/FASTTK CNTRL SOLN Soln     oxyCODONE-acetaminophen 10-325 MG per tablet  Commonly known as:  PERCOCET     potassium chloride 10 MEQ tablet  Commonly known as:  K-DUR,KLOR-CON     torsemide 20 MG tablet  Commonly known as:  DEMADEX     traZODone 100 MG tablet  Commonly known as:  DESYREL     XARTEMIS XR 7.5-325 MG Tbcr  Generic drug:  Oxycodone-Acetaminophen ER      TAKE these medications      Indication   ACCU-CHEK FASTCLIX LANCETS Misc  Use as instructed to test four times a day. DX: 250.92, insulin requiring   Indication:  diabetes management     ACCU-CHEK NANO SMARTVIEW W/DEVICE Kit  Use as instructed to test four times a day. DX: 250.92, insulin requiring   Indication:  diabetes management     carvedilol 25 MG tablet  Commonly known as:  COREG  Take 1 tablet (25 mg total) by mouth 2 (two) times daily with a meal.   Indication:  High Blood Pressure of  Unknown Cause     cloNIDine 0.1 MG tablet  Commonly known as:  CATAPRES  Take 1 tablet (0.1 mg total) by mouth 3 (three) times daily.   Indication:  Trouble Sleeping     gabapentin 100 MG capsule  Commonly known as:  NEURONTIN  Take 1 capsule (100 mg total) by mouth 3 (three) times daily.   Indication:  Neuropathic Pain     glucose blood test strip  Commonly known as:  ACCU-CHEK SMARTVIEW  Use as instructed to test four times a day. DX: 250.92, insulin requiring   Indication:  Diabetes Management     hydrOXYzine 25 MG tablet  Commonly known as:  ATARAX/VISTARIL  Take 1 tablet (25 mg total) by mouth every 6 (six) hours as needed for anxiety (or CIWA score </= 10).   Indication:  Anxiety Neurosis     Insulin Detemir 100 UNIT/ML Pen  Commonly known as:  LEVEMIR FLEXTOUCH  Inject 40 Units into the skin daily at 10 pm.   Indication:  Type 2 Diabetes, please keep your current dose and see your primary care MD for further changes     lisinopril 20 MG tablet  Commonly known as:  PRINIVIL,ZESTRIL  Take 1 tablet (20 mg total) by mouth daily.   Indication:  High Blood Pressure     pantoprazole 40 MG tablet  Commonly known as:  PROTONIX  Take 1 tablet (40 mg total) by mouth daily.   Indication:  Gastroesophageal Reflux Disease     Potassium Chloride ER 20 MEQ Tbcr  Take 20 mEq by mouth 2 (two) times daily.   Indication:  Low Amount of Potassium in the Blood     QUEtiapine 50 MG tablet  Commonly known as:  SEROQUEL  Take 1 tablet (50 mg total) by mouth at bedtime.   Indication:  Trouble Sleeping, mood stabilization     rivaroxaban 20 MG Tabs tablet  Commonly known as:  XARELTO  Take 1 tablet (20 mg total) by mouth daily. MUST HAVE FOLLOW UP APPOINTMENT FOR FURTHER REFILLS   Indication:  blood clots     rosuvastatin 20 MG tablet  Commonly known as:  CRESTOR  Take 1 tablet (20 mg total) by mouth daily.   Indication:  High Amount of Fats in the Blood     spironolactone 25 MG  tablet  Commonly known as:  ALDACTONE  Take 0.5 tablets (12.5 mg total) by mouth daily.   Indication:  Low Amount of Potassium in the Blood           Follow-up Information    Follow up with [facility] On 03/15/2014.   Why:  Thursday at 2:10 with Dr Clydene Pugh information:   [address] [place]  [phone]      Follow up with Aldine Contes, MD.   Specialty:  Internal Medicine   Why:  Pls make appt a week from now to follow up after hospitalization/insulin management   Contact information:   [address], [place] [address] [phone]       Follow-up recommendations:  Activity:  as tol, diet as tol   Note in patient's chart advising to contact PCP to follow up on his diabetes management.  Comments:  1.  Take all your medications as prescribed.              2.  Report any adverse side effects to outpatient provider.                       3.  Patient instructed to not use alcohol or illegal drugs while on prescription medicines.            4.  In the event of worsening symptoms, instructed patient to call 911, the crisis hotline or go to nearest emergency room for evaluation of symptoms.  Total Discharge Time:  30 min  Signed: Kerrie Buffalo MAY, AGNP-BC 03/07/2014, 3:45 PM

## 2014-03-07 NOTE — Clinical Social Work Note (Signed)
Pt discharged within 36 hours. Unable to complete PSA.   [place], [place]  03/07/2014 12:08 PM

## 2014-03-08 LAB — HEMOGLOBIN A1C
HEMOGLOBIN A1C: 6.6 % — AB (ref 4.8–5.6)
MEAN PLASMA GLUCOSE: 143 mg/dL

## 2014-03-12 NOTE — Progress Notes (Signed)
Patient Discharge Instructions:  After Visit Summary (AVS):   Faxed to:  03/12/14 Discharge Summary Note:   Faxed to:  03/12/14 Psychiatric Admission Assessment Note:   Faxed to:  03/12/14 Suicide Risk Assessment - Discharge Assessment:   Faxed to:  03/12/14 Faxed/Sent to the Next Level Care provider:  03/12/14 Faxed to [facility] [place], 03/12/2014, 2:59 PM

## 2014-03-15 ENCOUNTER — Encounter: Payer: Self-pay | Admitting: Internal Medicine

## 2014-03-15 ENCOUNTER — Ambulatory Visit (INDEPENDENT_AMBULATORY_CARE_PROVIDER_SITE_OTHER): Payer: Medicare Other | Admitting: Internal Medicine

## 2014-03-15 VITALS — BP 170/98 | HR 75 | Temp 98.2°F | Ht 71.0 in | Wt 271.8 lb

## 2014-03-15 DIAGNOSIS — F209 Schizophrenia, unspecified: Secondary | ICD-10-CM

## 2014-03-15 DIAGNOSIS — G252 Other specified forms of tremor: Secondary | ICD-10-CM

## 2014-03-15 DIAGNOSIS — R531 Weakness: Secondary | ICD-10-CM | POA: Diagnosis not present

## 2014-03-15 DIAGNOSIS — Z794 Long term (current) use of insulin: Secondary | ICD-10-CM

## 2014-03-15 DIAGNOSIS — M542 Cervicalgia: Secondary | ICD-10-CM

## 2014-03-15 DIAGNOSIS — S46011A Strain of muscle(s) and tendon(s) of the rotator cuff of right shoulder, initial encounter: Secondary | ICD-10-CM

## 2014-03-15 DIAGNOSIS — I10 Essential (primary) hypertension: Secondary | ICD-10-CM

## 2014-03-15 DIAGNOSIS — E119 Type 2 diabetes mellitus without complications: Secondary | ICD-10-CM | POA: Diagnosis not present

## 2014-03-15 DIAGNOSIS — E118 Type 2 diabetes mellitus with unspecified complications: Secondary | ICD-10-CM

## 2014-03-15 DIAGNOSIS — M25511 Pain in right shoulder: Secondary | ICD-10-CM

## 2014-03-15 DIAGNOSIS — M5412 Radiculopathy, cervical region: Secondary | ICD-10-CM

## 2014-03-15 LAB — GLUCOSE, CAPILLARY: GLUCOSE-CAPILLARY: 91 mg/dL (ref 70–99)

## 2014-03-15 NOTE — Assessment & Plan Note (Signed)
Lab Results  Component Value Date   HGBA1C 6.6* 03/07/2014   HGBA1C 6.4 01/23/2014   HGBA1C 6.9 07/12/2013     Assessment: Diabetes control:  well controlled Progress toward A1C goal:   at goal Comments: pt now off abilify  Plan: Medications:  continue current medications Home glucose monitoring: Frequency:   Timing:   Instruction/counseling given: reminded to get eye exam, reminded to bring blood glucose meter & log to each visit and discussed foot care Educational resources provided: brochure (denies) Self management tools provided:   Other plans: If A1C remains stable will consider decreasing lantus on next visit

## 2014-03-15 NOTE — Assessment & Plan Note (Addendum)
-   Pt now off abilify - Pt has not yet filled his seroquel. Asked him to fill his prescription and follow up with psych at the end of this month (has an appointment) - Suicidal ideation has now resolved

## 2014-03-15 NOTE — Patient Instructions (Signed)
-   It was a pleasure seeing you today - I will refer you back to the pain clinic today for follow up for your chronic pain  - I will also check an MRI of your neck and right shoulder - Please take your medications as prescribed - Your BP is elevated. Please take your BP medications - Avoid any illicit substances like cocaine

## 2014-03-15 NOTE — Assessment & Plan Note (Signed)
BP Readings from Last 3 Encounters:  03/15/14 170/98  03/07/14 148/91  02/25/14 147/72    Lab Results  Component Value Date   NA 137 03/05/2014   K 4.4 03/05/2014   CREATININE 0.88 03/05/2014    Assessment: Blood pressure control:  poorly controlled Progress toward BP goal:   deteriorated Comments: pt has not filled his medications yet and has not taken any  Plan: Medications:  continue current medications Educational resources provided: brochure (denies) Self management tools provided:   Other plans: Pt instructed to refill his meds and start taking them. Will reassess BP in 2 weeks on meds

## 2014-03-15 NOTE — Progress Notes (Signed)
   Subjective:    Patient ID: [name] [name], male    DOB: [birth year], 59 y.o.   MRN: [id number]  Arm Pain   Pt states he has been having R arm pain for approx 1 month now and states that the pain is worsening. He can't lift his right arm over his head. He had a recent X ray of his right shoulder with degenerative changes but no acute abnormalities.   Neck pain: - pt also with limited ROM in his neck and complains of pain over his cervical spine. He had a recent x ray which showed multilevel osseous foraminal impingement due to uncinate and facet spurring. - He follows with a pain clinic and has an appointment this month for follow up - Pt also complains of associated hand tremors which are constant and states his hands are weaker than normal. He stated that his R hand tremors were chronic but it is now bilateral and worse.  Suicidal Ideation: - Now resolved. He did stay 1 night at behavioral health and was discharged recently. He is to follow up with psych on Feb 24th.    Review of Systems  Constitutional: Negative.   Respiratory: Negative.   Cardiovascular: Negative.   Gastrointestinal: Negative.   Musculoskeletal: Positive for arthralgias, neck pain and neck stiffness.  Skin: Negative.   Neurological: Positive for tremors. Negative for dizziness, light-headedness and headaches.  Psychiatric/Behavioral: Negative for suicidal ideas.       Objective:   Physical Exam  Constitutional: He is oriented to person, place, and time. He appears well-developed and well-nourished.  HENT:  Head: Normocephalic and atraumatic.  Eyes: Conjunctivae are normal.  Neck: Normal range of motion.  Cardiovascular: Normal rate, regular rhythm and normal heart sounds.   No murmur heard. Pulmonary/Chest: Effort normal and breath sounds normal. No respiratory distress. He has no wheezes. He has no rales.  Abdominal: Soft. Bowel sounds are normal. He exhibits no distension. There is no tenderness.  There is no rebound.  Musculoskeletal:  Decreased ROM in R shoulder Power 4/5 b/l hands and is unable to lift right arm above head  Neurological: He is alert and oriented to person, place, and time.  Skin: Skin is warm and dry.          Assessment & Plan:  - Given his persistent neck pain and mild weakness in his hands I would get an MRI of his C spine to further evaluate as it appears he has narrwoing of his neural foramina from spurring. - Pt also with possible rotator cuff injury given his persistent R arm pain and inability to raise his arm over his shoulder. Pt to get MRI of his right shoulder for further eval and if surgery is required would like to be referred to [facility] wanted referral to neurology for further work up of his tremors. Discussed with patient that this might be a side effect of his psychiatric medications but unsure of exact etiology at this point  Please see problem based charting for remaining assessment and plan:

## 2014-03-29 ENCOUNTER — Ambulatory Visit: Payer: Self-pay | Admitting: Internal Medicine

## 2014-04-03 ENCOUNTER — Institutional Professional Consult (permissible substitution): Payer: Medicaid Other | Admitting: Neurology

## 2014-04-03 ENCOUNTER — Telehealth: Payer: Self-pay | Admitting: Neurology

## 2014-04-03 NOTE — Addendum Note (Signed)
Addended by: Hulan Fray on: 04/03/2014 05:53 PM   Modules accepted: Orders

## 2014-04-03 NOTE — Telephone Encounter (Signed)
This patient did not show for a new patient appointment today. 

## 2014-04-10 ENCOUNTER — Encounter: Payer: Self-pay | Admitting: Neurology

## 2014-04-19 ENCOUNTER — Ambulatory Visit (INDEPENDENT_AMBULATORY_CARE_PROVIDER_SITE_OTHER): Payer: Medicare Other | Admitting: Dietician

## 2014-04-19 ENCOUNTER — Ambulatory Visit (HOSPITAL_COMMUNITY): Admission: RE | Admit: 2014-04-19 | Payer: Medicare Other | Source: Ambulatory Visit

## 2014-04-19 ENCOUNTER — Ambulatory Visit (INDEPENDENT_AMBULATORY_CARE_PROVIDER_SITE_OTHER): Payer: Medicare Other | Admitting: Internal Medicine

## 2014-04-19 ENCOUNTER — Ambulatory Visit (HOSPITAL_COMMUNITY): Payer: Medicare Other | Attending: Internal Medicine

## 2014-04-19 ENCOUNTER — Encounter: Payer: Self-pay | Admitting: Dietician

## 2014-04-19 ENCOUNTER — Encounter: Payer: Self-pay | Admitting: Internal Medicine

## 2014-04-19 VITALS — BP 160/100 | HR 81 | Temp 98.3°F | Ht 71.0 in | Wt 274.9 lb

## 2014-04-19 DIAGNOSIS — E118 Type 2 diabetes mellitus with unspecified complications: Secondary | ICD-10-CM | POA: Diagnosis not present

## 2014-04-19 DIAGNOSIS — Z0000 Encounter for general adult medical examination without abnormal findings: Secondary | ICD-10-CM | POA: Insufficient documentation

## 2014-04-19 DIAGNOSIS — Z23 Encounter for immunization: Secondary | ICD-10-CM

## 2014-04-19 DIAGNOSIS — M549 Dorsalgia, unspecified: Secondary | ICD-10-CM | POA: Diagnosis not present

## 2014-04-19 DIAGNOSIS — E119 Type 2 diabetes mellitus without complications: Secondary | ICD-10-CM

## 2014-04-19 DIAGNOSIS — F1420 Cocaine dependence, uncomplicated: Secondary | ICD-10-CM

## 2014-04-19 DIAGNOSIS — F209 Schizophrenia, unspecified: Secondary | ICD-10-CM

## 2014-04-19 DIAGNOSIS — Z79899 Other long term (current) drug therapy: Secondary | ICD-10-CM | POA: Diagnosis not present

## 2014-04-19 DIAGNOSIS — I10 Essential (primary) hypertension: Secondary | ICD-10-CM

## 2014-04-19 DIAGNOSIS — G8929 Other chronic pain: Secondary | ICD-10-CM

## 2014-04-19 DIAGNOSIS — F1910 Other psychoactive substance abuse, uncomplicated: Secondary | ICD-10-CM | POA: Diagnosis not present

## 2014-04-19 LAB — GLUCOSE, CAPILLARY: GLUCOSE-CAPILLARY: 94 mg/dL (ref 70–99)

## 2014-04-19 LAB — HM DIABETES EYE EXAM

## 2014-04-19 MED ORDER — DICLOFENAC SODIUM 1 % TD GEL: 4.0000 g | Freq: Four times a day (QID) | TRANSDERMAL | Status: DC

## 2014-04-19 MED ORDER — FEXOFENADINE-PSEUDOEPHED ER 60-120 MG PO TB12: 1.0000 | ORAL_TABLET | Freq: Two times a day (BID) | ORAL | Status: DC

## 2014-04-19 MED ORDER — LISINOPRIL 40 MG PO TABS: 40.0000 mg | ORAL_TABLET | Freq: Every day | ORAL | Status: DC

## 2014-04-19 NOTE — Assessment & Plan Note (Signed)
-   Patient states he has persistent back pain and that his pain clinic dismissed him as he had a positive UDS for cocaine - Explained to patient that I would not be able to refill his pain meds secondary to his positive UDS - Will recheck UDS today and attempt to get him in to another pain clinic

## 2014-04-19 NOTE — Patient Instructions (Signed)
-   It was a pleasure seeing you today - We will check a urine test today - Your blood pressure is elevated. I will increase your lisinopril to 40 mg/day. Please take 2 of your 20 mg pills daily till it finishes and then you can switch to the 40 mg pills once a day - I have tried referring you to the pain clinic. Will check your urine drug screen today - I will also attempt to get you another appointment with the neurologist. Please follow up - I have also prescribed allegra for you to try and help with your allergies - I have given you voltaren gel for your back pain. Do not take ibuprofen or other NSAIDs while using this

## 2014-04-19 NOTE — Assessment & Plan Note (Signed)
Lab Results  Component Value Date   HGBA1C 6.6* 03/07/2014   HGBA1C 6.4 01/23/2014   HGBA1C 6.9 07/12/2013     Assessment: Diabetes control:  well controlled Progress toward A1C goal:   at goal Comments:  Well controlled off abilify  Plan: Medications:  continue current medications Home glucose monitoring: Frequency:   Timing:   Instruction/counseling given: reminded to get eye exam and reminded to bring blood glucose meter & log to each visit Educational resources provided:   Self management tools provided:   Other plans: Will check urine microalbumin today

## 2014-04-19 NOTE — Assessment & Plan Note (Signed)
BP Readings from Last 3 Encounters:  04/19/14 160/100  03/15/14 170/98  03/07/14 148/91    Lab Results  Component Value Date   NA 137 03/05/2014   K 4.4 03/05/2014   CREATININE 0.88 03/05/2014    Assessment: Blood pressure control:  poorly controlled Progress toward BP goal:   improved Comments: Patient states he is compliant with his meds. Denies any symptoms  Plan: Medications:  continue current medications, WIll increase lisinopril to 40 mg for better BP control Educational resources provided:   Self management tools provided:   Other plans: Will follow up in 3 months

## 2014-04-19 NOTE — Progress Notes (Signed)
Retinal images done today  for annual diabetes eye exam.

## 2014-04-19 NOTE — Assessment & Plan Note (Signed)
-   He has positive UDS for cocaine in the past and was dismissed from his pain clinic - He states taht it was a mistake and that he is trying to abstain now - Will recheck UDS today and attempt to refer him to another pain clinic

## 2014-04-19 NOTE — Assessment & Plan Note (Signed)
-   Patient is now off abilify and on seroquel per patient - Unsure of exact dose of seroquel as he did not bring bottle in - States his symptoms are much better controlled on seroquel

## 2014-04-19 NOTE — Assessment & Plan Note (Signed)
-   Will give prevnar 13 today

## 2014-04-19 NOTE — Progress Notes (Signed)
   Subjective:    Patient ID: [name] [name], male    DOB: 05/28/1955, 59 y.o.   MRN: [id number]  HPI Patient here for routine follow up of HTN and for follow up of his back pain. He also complains of eyes watering * 1 week which he attributes to pollen and allergies. He also missed his last neuro appointment and was scheduled to have MRIs done today of his shulder and back but states he will need to reschedule   Review of Systems  Constitutional: Negative.   HENT: Negative.   Eyes: Negative for photophobia, pain, redness, itching and visual disturbance.       Watering eyes he attributes to allergies  Respiratory: Negative.   Cardiovascular: Negative.   Gastrointestinal: Negative.   Musculoskeletal: Positive for back pain. Negative for myalgias, joint swelling, arthralgias, gait problem and neck pain.  Skin: Negative.   Allergic/Immunologic: Positive for environmental allergies.  Neurological: Negative.   Psychiatric/Behavioral: Negative.        Objective:   Physical Exam  Constitutional: He is oriented to person, place, and time. He appears well-developed and well-nourished.  HENT:  Head: Normocephalic and atraumatic.  Eyes: Conjunctivae are normal.  Mild watering, no erythema or photophobia  Neck: Normal range of motion.  Cardiovascular: Normal rate, regular rhythm and normal heart sounds.   Pulmonary/Chest: Effort normal and breath sounds normal. No respiratory distress. He has no wheezes.  Abdominal: Soft. Bowel sounds are normal. He exhibits no distension. There is no tenderness.  Musculoskeletal: Normal range of motion. He exhibits no edema or tenderness.  Neurological: He is alert and oriented to person, place, and time.  Skin: Skin is warm and dry.  Psychiatric: He has a normal mood and affect. His behavior is normal.          Assessment & Plan:  Please see problem based charting for assessment and plan

## 2014-04-20 LAB — PRESCRIPTION ABUSE MONITORING 15P, URINE
AMPHETAMINE/METH: NEGATIVE ng/mL
Barbiturate Screen, Urine: NEGATIVE ng/mL
Buprenorphine, Urine: NEGATIVE ng/mL
CANNABINOID SCRN UR: NEGATIVE ng/mL
CARISOPRODOL, URINE: NEGATIVE ng/mL
Cocaine Metabolites: NEGATIVE ng/mL
Creatinine, Urine: 252.32 mg/dL (ref >20.0)
FENTANYL URINE: NEGATIVE ng/mL
Meperidine, Ur: NEGATIVE ng/mL
Methadone Screen, Urine: NEGATIVE ng/mL
OXYCODONE SCRN UR: NEGATIVE ng/mL
Opiate Screen, Urine: NEGATIVE ng/mL
PROPOXYPHENE: NEGATIVE ng/mL
Tramadol Scrn, Ur: NEGATIVE ng/mL
Zolpidem, Urine: NEGATIVE ng/mL

## 2014-04-20 LAB — MICROALBUMIN / CREATININE URINE RATIO
Creatinine, Urine: 254.2 mg/dL
MICROALB UR: 33.2 mg/dL — AB (ref <2.0)
Microalb Creat Ratio: 130.6 mg/g — ABNORMAL HIGH (ref 0.0–30.0)

## 2014-04-22 LAB — BENZODIAZEPINES (GC/LC/MS), URINE
ALPRAZOLAMU: NEGATIVE ng/mL (ref <25)
CLONAZEPAU: NEGATIVE ng/mL (ref <25)
Flurazepam metabolite (GC/LC/MS), ur confirm: NEGATIVE ng/mL (ref <50)
Lorazepam (GC/LC/MS), ur confirm: NEGATIVE ng/mL (ref <50)
Midazolam (GC/LC/MS), ur confirm: NEGATIVE ng/mL (ref <50)
Nordiazepam (GC/LC/MS), ur confirm: NEGATIVE ng/mL (ref <50)
Oxazepam (GC/LC/MS), ur confirm: 71 ng/mL — ABNORMAL HIGH (ref <50)
TEMAZEPAMU: NEGATIVE ng/mL (ref <50)
Triazolam metabolite (GC/LC/MS), ur confirm: NEGATIVE ng/mL (ref <50)

## 2014-04-26 ENCOUNTER — Other Ambulatory Visit: Payer: Self-pay | Admitting: Internal Medicine

## 2014-04-26 ENCOUNTER — Telehealth: Payer: Self-pay | Admitting: *Deleted

## 2014-04-26 NOTE — Telephone Encounter (Signed)
RTC to patient about appointment for MRI.  Patient stated that he did not go to the appointments on 04/19/2014 and wanted to know when they had been rescheduled.  Patient also wanted to get a 2 week appointment with Dr. Dr. Dareen Piano.  Patient was rescheduled on 05/11/2014 at 4:00 PM with a 3:45 PM arrival in Xray at [facility].  Patient was called and is aware of the appointment.  Patient was informed that he has a follow up appointment with Dr. Dareen Piano on 05/24/2014 at 8:45 AM.  Patient voiced understanding of the appointments.  Sander Nephew, RN 04/26/2014 12:11 PM

## 2014-04-26 NOTE — Telephone Encounter (Signed)
Thank you Edd Fabian

## 2014-04-27 ENCOUNTER — Encounter: Payer: Self-pay | Admitting: *Deleted

## 2014-05-11 ENCOUNTER — Ambulatory Visit (HOSPITAL_COMMUNITY): Payer: Medicare Other

## 2014-05-11 ENCOUNTER — Ambulatory Visit (HOSPITAL_COMMUNITY): Admission: RE | Admit: 2014-05-11 | Payer: Medicare Other | Source: Ambulatory Visit

## 2014-05-17 ENCOUNTER — Encounter (HOSPITAL_COMMUNITY): Payer: Self-pay | Admitting: Cardiology

## 2014-05-17 ENCOUNTER — Emergency Department (HOSPITAL_COMMUNITY)
Admission: EM | Admit: 2014-05-17 | Discharge: 2014-05-17 | Disposition: A | Discharge status: Home / Self Care | Payer: Medicare Other | Attending: Emergency Medicine | Admitting: Emergency Medicine

## 2014-05-17 DIAGNOSIS — Z862 Personal history of diseases of the blood and blood-forming organs and certain disorders involving the immune mechanism: Secondary | ICD-10-CM | POA: Insufficient documentation

## 2014-05-17 DIAGNOSIS — Z87891 Personal history of nicotine dependence: Secondary | ICD-10-CM | POA: Diagnosis not present

## 2014-05-17 DIAGNOSIS — Z8659 Personal history of other mental and behavioral disorders: Secondary | ICD-10-CM | POA: Diagnosis not present

## 2014-05-17 DIAGNOSIS — M549 Dorsalgia, unspecified: Secondary | ICD-10-CM | POA: Insufficient documentation

## 2014-05-17 DIAGNOSIS — Q211 Atrial septal defect: Secondary | ICD-10-CM | POA: Insufficient documentation

## 2014-05-17 DIAGNOSIS — Z8673 Personal history of transient ischemic attack (TIA), and cerebral infarction without residual deficits: Secondary | ICD-10-CM | POA: Diagnosis not present

## 2014-05-17 DIAGNOSIS — Z8619 Personal history of other infectious and parasitic diseases: Secondary | ICD-10-CM | POA: Diagnosis not present

## 2014-05-17 DIAGNOSIS — Z7901 Long term (current) use of anticoagulants: Secondary | ICD-10-CM | POA: Insufficient documentation

## 2014-05-17 DIAGNOSIS — I129 Hypertensive chronic kidney disease with stage 1 through stage 4 chronic kidney disease, or unspecified chronic kidney disease: Secondary | ICD-10-CM | POA: Diagnosis not present

## 2014-05-17 DIAGNOSIS — Z8546 Personal history of malignant neoplasm of prostate: Secondary | ICD-10-CM | POA: Diagnosis not present

## 2014-05-17 DIAGNOSIS — R251 Tremor, unspecified: Secondary | ICD-10-CM | POA: Insufficient documentation

## 2014-05-17 DIAGNOSIS — I4891 Unspecified atrial fibrillation: Secondary | ICD-10-CM | POA: Diagnosis not present

## 2014-05-17 DIAGNOSIS — E785 Hyperlipidemia, unspecified: Secondary | ICD-10-CM | POA: Diagnosis not present

## 2014-05-17 DIAGNOSIS — I5042 Chronic combined systolic (congestive) and diastolic (congestive) heart failure: Secondary | ICD-10-CM | POA: Insufficient documentation

## 2014-05-17 DIAGNOSIS — I10 Essential (primary) hypertension: Secondary | ICD-10-CM | POA: Diagnosis not present

## 2014-05-17 DIAGNOSIS — G4700 Insomnia, unspecified: Secondary | ICD-10-CM | POA: Diagnosis not present

## 2014-05-17 DIAGNOSIS — K219 Gastro-esophageal reflux disease without esophagitis: Secondary | ICD-10-CM | POA: Diagnosis not present

## 2014-05-17 DIAGNOSIS — Z794 Long term (current) use of insulin: Secondary | ICD-10-CM | POA: Diagnosis not present

## 2014-05-17 DIAGNOSIS — E781 Pure hyperglyceridemia: Secondary | ICD-10-CM | POA: Diagnosis not present

## 2014-05-17 DIAGNOSIS — N182 Chronic kidney disease, stage 2 (mild): Secondary | ICD-10-CM | POA: Diagnosis not present

## 2014-05-17 DIAGNOSIS — Z79899 Other long term (current) drug therapy: Secondary | ICD-10-CM | POA: Insufficient documentation

## 2014-05-17 LAB — CBC WITH DIFFERENTIAL/PLATELET
BASOS ABS: 0 10*3/uL (ref 0.0–0.1)
Basophils Relative: 0 % (ref 0–1)
EOS ABS: 0.1 10*3/uL (ref 0.0–0.7)
Eosinophils Relative: 2 % (ref 0–5)
HCT: 51.8 % (ref 39.0–52.0)
Hemoglobin: 17.1 g/dL — ABNORMAL HIGH (ref 13.0–17.0)
LYMPHS ABS: 1.8 10*3/uL (ref 0.7–4.0)
LYMPHS PCT: 24 % (ref 12–46)
MCH: 29.8 pg (ref 26.0–34.0)
MCHC: 33 g/dL (ref 30.0–36.0)
MCV: 90.4 fL (ref 78.0–100.0)
Monocytes Absolute: 0.8 10*3/uL (ref 0.1–1.0)
Monocytes Relative: 10 % (ref 3–12)
NEUTROS PCT: 64 % (ref 43–77)
Neutro Abs: 5.1 10*3/uL (ref 1.7–7.7)
PLATELETS: 203 10*3/uL (ref 150–400)
RBC: 5.73 MIL/uL (ref 4.22–5.81)
RDW: 13 % (ref 11.5–15.5)
WBC: 7.8 10*3/uL (ref 4.0–10.5)

## 2014-05-17 LAB — URINALYSIS, ROUTINE W REFLEX MICROSCOPIC
Glucose, UA: NEGATIVE mg/dL
Hgb urine dipstick: NEGATIVE
Ketones, ur: NEGATIVE mg/dL
LEUKOCYTES UA: NEGATIVE
Nitrite: NEGATIVE
PH: 5.5 (ref 5.0–8.0)
Protein, ur: >300 mg/dL — AB
Specific Gravity, Urine: 1.024 (ref 1.005–1.030)
Urobilinogen, UA: 1 mg/dL (ref 0.0–1.0)

## 2014-05-17 LAB — BASIC METABOLIC PANEL
Anion gap: 9 (ref 5–15)
BUN: 12 mg/dL (ref 6–23)
CO2: 25 mmol/L (ref 19–32)
Calcium: 10.5 mg/dL (ref 8.4–10.5)
Chloride: 108 mmol/L (ref 96–112)
Creatinine, Ser: 1.16 mg/dL (ref 0.50–1.35)
GFR calc Af Amer: 77 mL/min — ABNORMAL LOW (ref >90)
GFR calc non Af Amer: 67 mL/min — ABNORMAL LOW (ref >90)
GLUCOSE: 110 mg/dL — AB (ref 70–99)
Potassium: 4.2 mmol/L (ref 3.5–5.1)
Sodium: 142 mmol/L (ref 135–145)

## 2014-05-17 LAB — URINE MICROSCOPIC-ADD ON

## 2014-05-17 LAB — RAPID URINE DRUG SCREEN, HOSP PERFORMED
Amphetamines: NOT DETECTED
Barbiturates: POSITIVE — AB
Benzodiazepines: NOT DETECTED
Cocaine: POSITIVE — AB
Opiates: POSITIVE — AB
Tetrahydrocannabinol: NOT DETECTED

## 2014-05-17 LAB — CBG MONITORING, ED: GLUCOSE-CAPILLARY: 121 mg/dL — AB (ref 70–99)

## 2014-05-17 LAB — MAGNESIUM: Magnesium: 2.1 mg/dL (ref 1.5–2.5)

## 2014-05-17 LAB — ETHANOL

## 2014-05-17 MED ORDER — CLONIDINE HCL 0.1 MG PO TABS
0.1000 mg | ORAL_TABLET | Freq: Once | ORAL | Status: AC
  Administered 2014-05-17: 0.1 mg via ORAL
  Filled 2014-05-17: qty 1

## 2014-05-17 NOTE — ED Notes (Signed)
Pt states last drink was Tuesday, states not a daily drinker but a binge drinker.  States was not on a binge, drank "one glass" only on Tuesday.

## 2014-05-17 NOTE — ED Notes (Signed)
Pt reports chronic back pain, shaking and insomnia for the past week. Reports his CBG was low yesterday, but was 121 in triage today. Pt A&Ox4. Skin warm and dry. No distress noted.

## 2014-05-17 NOTE — ED Provider Notes (Signed)
CSN: [id number]     Arrival date & time 05/17/14  1650 History   First MD Initiated Contact with Patient 05/17/14 1704     Chief Complaint  Patient presents with  . Back Pain  . Shaking  . Insomnia   HPI Patient presents to the emergency room with complaints of shaking in his upper extremities. Patient states the symptoms started several months ago. They have been gradually getting worse. The patient was concerned about this so he decided to come to the emergency room. Shaking seems to be primarily in his bilateral upper extremities. He has not noticed any shaking in his lower extremities although he thinks it doesn't affect his walking. He denies any trouble with any headaches or slurred speech. He denies any seizures. Patient did mention this problem to his doctor several months ago when he was told it could be related to his medications. He has not followed up with them since that time. He also has some issues with chronic back pain but there is no acute change with that. Past Medical History  Diagnosis Date  . GERD (gastroesophageal reflux disease)   . Schizophrenia, schizo-affective   . Cataract   . Hyperlipidemia   . Hypertension     uncontrolled with medication noncompliance  . HIT (heparin-induced thrombocytopenia)   . Continuous chronic alcoholism   . H/O cocaine abuse   . Stroke 01/2012    Small cerebellar infarcts right greater than left as well as questionable acute left external capsule and caudate nuclear punctate lacunar infarcts noted per MRI (01/2012) - presumed to be embolic likely source PFO with right to left shunt (noted per TEE 01/ 2014)  . Chronic combined systolic and diastolic CHF (congestive heart failure)     a) EF 40-45% per 2D echo (02/2012) with grade 1 DD b)  NICM c) RHC (04/2012): RA: 4, RV 45/3/4, PA 42/9 (24), PCWP 14, Fick CO/CI: 5.2 /2.2, PVR 1.9 WU, PA 62% and 64% d) ECHO (10/2012) EF 40-45%, grade II DD, RV nl  . Degenerative lumbar spinal stenosis      s/p L2-3, L3-4, L4-5 laminectomy partial facetectomy, and bilateral foraminotomy  . History of pancreatitis 01/2011    Admission for acute pancreatitis presumed 2/2 ongoing alcohol abuse- and hypertriglyceridemia  . PFO (patent foramen ovale) 01/2012    with right to left shunt, noted per TEE in evaluation for source of embolic stroke in 04/8248  . CKD (chronic kidney disease) stage 2, GFR 60-89 ml/min     BL SCr approximately 1-1.3  . 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 Hepatic steatosis     suspected 2/2 alcohol abuse  . Splenic cyst   . Colitis 05/2009    History of colitis of ascending colon noted on CT abd/pelvis (05/2009), with interval resolution with subsequent CT  . Hypertriglyceridemia   . NICM (nonischemic cardiomyopathy)     a. LHC (04/2012): nl arteries  . Atrial fibrillation   . Cancer     Prostate cancer-bx. 3 weeks ago  . Hepatitis C    Past Surgical History  Procedure Laterality Date  . L2-3, l3-4, l4-5 laminectomy, partial facetectomy    . Left achilles Right 2007  . Tonsillectomy    . Knee arthroscopy    . I&d extremity  03/20/2011    Procedure: IRRIGATION AND DEBRIDEMENT EXTREMITY;  Surgeon: Kerin Salen, MD;  Location: [facility];  Service: Orthopedics;  Laterality: Left;  I&D LEFT ACHILLIES TENDON  . Eye surgery      right eye  . Resection distal clavical  09/17/2011    Procedure: RESECTION DISTAL CLAVICAL;  Surgeon: Nita Sells, MD;  Location: [facility];  Service: Orthopedics;  Laterality: Right;  right shoulder arthroscopy with sad and open distal clavicle excision   . Esophagogastroduodenoscopy N/A 06/25/2012    Procedure: ESOPHAGOGASTRODUODENOSCOPY (EGD);  Surgeon: Milus Banister, MD;  Location: [facility];  Service: Endoscopy;  Laterality: N/A;  . Robot assisted laparoscopic radical prostatectomy N/A 12/16/2012    Procedure: ROBOTIC ASSISTED LAPAROSCOPIC  PROSTATECTOMY ;  Surgeon: Ardis Hughs, MD;  Location: [facility];  Service: Urology;  Laterality: N/A;   Family History  Problem Relation Age of Onset  . CAD Mother 40    deceased  . CAD Sister   . CAD Brother 50    died from MI at age 64yo  . Hypertension     History  Substance Use Topics  . Smoking status: Former Smoker -- 0.50 packs/day for 30 years    Types: Cigarettes    Quit date: 06/24/2001  . Smokeless tobacco: Never Used  . Alcohol Use: 1.8 - 2.4 oz/week    0 Standard drinks or equivalent, 3-4 Glasses of wine per week     Comment: about 1/5 liquor 2-3 days a week    Review of Systems  Constitutional:       Patient is having trouble with insomnia  All other systems reviewed and are negative.     Allergies  Heparin and Thorazine  Home Medications   Prior to Admission medications   Medication Sig Start Date End Date Taking? Authorizing Provider  ACCU-CHEK FASTCLIX LANCETS MISC Use as instructed to test four times a day. DX: 250.92, insulin requiring 03/07/14  Yes Kerrie Buffalo, NP  B-D UF III MINI PEN NEEDLES 31G X 5 MM MISC as directed UPTO 4 TIMES DAILY 04/26/14  Yes Nischal Narendra, MD  Blood Glucose Monitoring Suppl (ACCU-CHEK NANO SMARTVIEW) W/DEVICE KIT Use as instructed to test four times a day. DX: 250.92, insulin requiring 03/07/14  Yes Kerrie Buffalo, NP  carvedilol (COREG) 25 MG tablet Take 1 tablet (25 mg total) by mouth 2 (two) times daily with a meal. 03/07/14  Yes Kerrie Buffalo, NP  cloNIDine (CATAPRES) 0.1 MG tablet Take 1 tablet (0.1 mg total) by mouth 3 (three) times daily. Patient taking differently: Take 0.1 mg by mouth 2 (two) times daily.  03/07/14  Yes Kerrie Buffalo, NP  diclofenac sodium (VOLTAREN) 1 % GEL Apply 4 g topically 4 (four) times daily. 04/19/14  Yes Nischal Dareen Piano, MD  gabapentin (NEURONTIN) 100 MG capsule Take 1 capsule (100 mg total) by mouth 3 (three) times daily. 03/07/14  Yes Kerrie Buffalo, NP  glucose blood (ACCU-CHEK  SMARTVIEW) test strip Use as instructed to test four times a day. DX: 250.92, insulin requiring 03/07/14  Yes Kerrie Buffalo, NP  hydrOXYzine (ATARAX/VISTARIL) 25 MG tablet Take 1 tablet (25 mg total) by mouth every 6 (six) hours as needed for anxiety (or CIWA score </= 10). 03/07/14  Yes Kerrie Buffalo, NP  Insulin Detemir (LEVEMIR FLEXTOUCH) 100 UNIT/ML Pen Inject 40 Units into the skin daily at 10 pm. 03/07/14  Yes Kerrie Buffalo, NP  lisinopril (PRINIVIL,ZESTRIL) 40 MG tablet Take 1 tablet (40 mg total) by mouth daily. 04/19/14  Yes Nischal Narendra, MD  pantoprazole (PROTONIX) 40 MG tablet Take 1 tablet (40 mg total) by mouth daily. 03/07/14  Yes  Kerrie Buffalo, NP  potassium chloride 20 MEQ TBCR Take 20 mEq by mouth 2 (two) times daily. 03/07/14  Yes Kerrie Buffalo, NP  QUEtiapine (SEROQUEL) 50 MG tablet Take 1 tablet (50 mg total) by mouth at bedtime. 03/07/14  Yes Kerrie Buffalo, NP  rivaroxaban (XARELTO) 20 MG TABS tablet Take 1 tablet (20 mg total) by mouth daily. MUST HAVE FOLLOW UP APPOINTMENT FOR FURTHER REFILLS 03/07/14  Yes Kerrie Buffalo, NP  rosuvastatin (CRESTOR) 20 MG tablet Take 1 tablet (20 mg total) by mouth daily. 03/07/14  Yes Kerrie Buffalo, NP  spironolactone (ALDACTONE) 25 MG tablet Take 0.5 tablets (12.5 mg total) by mouth daily. 03/07/14  Yes Kerrie Buffalo, NP  fexofenadine-pseudoephedrine (ALLEGRA-D) 60-120 MG per tablet Take 1 tablet by mouth 2 (two) times daily. 04/19/14   Nischal Narendra, MD   BP 195/133 mmHg  Pulse 66  Temp(Src) 98.1 F (36.7 C)  Resp 17  SpO2 95% Physical Exam  Constitutional: He appears well-developed and well-nourished. No distress.  HENT:  Head: Normocephalic and atraumatic.  Right Ear: External ear normal.  Left Ear: External ear normal.  Eyes: Conjunctivae are normal. Right eye exhibits no discharge. Left eye exhibits no discharge. No scleral icterus.  Neck: Neck supple. No tracheal deviation present.  Cardiovascular: Normal rate, regular  rhythm and intact distal pulses.   Pulmonary/Chest: Effort normal and breath sounds normal. No stridor. No respiratory distress. He has no wheezes. He has no rales.  Abdominal: Soft. Bowel sounds are normal. He exhibits no distension. There is no tenderness. There is no rebound and no guarding.  Musculoskeletal: He exhibits no edema or tenderness.  Neurological: He is alert. He has normal strength. He displays tremor. No cranial nerve deficit (no facial droop, extraocular movements intact, no slurred speech) or sensory deficit. He exhibits normal muscle tone. He displays no seizure activity. Coordination normal.  Patient has a resting tremor of his bilateral upper extremities, the tremor stops when the patient starts moving his arms  Skin: Skin is warm and dry. No rash noted.  Psychiatric:  Flat affect  Nursing note and vitals reviewed.   ED Course  Procedures (including critical care time) Labs Review Labs Reviewed  CBC WITH DIFFERENTIAL/PLATELET - Abnormal; Notable for the following:    Hemoglobin 17.1 (*)    All other components within normal limits  BASIC METABOLIC PANEL - Abnormal; Notable for the following:    Glucose, Bld 110 (*)    GFR calc non Af Amer 67 (*)    GFR calc Af Amer 77 (*)    All other components within normal limits  URINALYSIS, ROUTINE W REFLEX MICROSCOPIC - Abnormal; Notable for the following:    Bilirubin Urine SMALL (*)    Protein, ur >300 (*)    All other components within normal limits  URINE RAPID DRUG SCREEN (HOSP PERFORMED) - Abnormal; Notable for the following:    Opiates POSITIVE (*)    Cocaine POSITIVE (*)    Barbiturates POSITIVE (*)    All other components within normal limits  URINE MICROSCOPIC-ADD ON - Abnormal; Notable for the following:    Casts HYALINE CASTS (*)    All other components within normal limits  CBG MONITORING, ED - Abnormal; Notable for the following:    Glucose-Capillary 121 (*)    All other components within normal limits   ETHANOL  MAGNESIUM    Imaging Review No results found.   EKG Interpretation   Date/Time:  Thursday May 17 2014 17:13:58 EDT Ventricular Rate:  103 PR Interval:  181 QRS Duration: 107 QT Interval:  347 QTC Calculation: 007 R Axis:   -140 Text Interpretation:  Sinus tachycardia  Right axis deviation Consider  left ventricular hypertrophy Baseline wander in lead(s) II III aVF V3 No  significant change since last tracing Confirmed by Shacara Cozine  MD-J, Youssouf Shipley  ([id number]) on 05/17/2014 5:39:08 PM      MDM   Final diagnoses:  Tremor  Essential hypertension    Patient appears a benign tremor. His laboratory tests are otherwise unremarkable. Did explain to the patient that his cocaine use could be attributing to his symptoms. I recommended he discontinue that.  He also has elevated blood pressure we talked about taking his home medications regularly. I did order a dose of oral clonidine before he goes.  Dorie Rank, MD 05/17/14 2123

## 2014-05-24 ENCOUNTER — Encounter: Payer: Self-pay | Admitting: Internal Medicine

## 2014-05-31 ENCOUNTER — Ambulatory Visit (HOSPITAL_COMMUNITY): Payer: Medicare Other

## 2014-05-31 ENCOUNTER — Ambulatory Visit (HOSPITAL_COMMUNITY): Admission: RE | Admit: 2014-05-31 | Payer: Medicare Other | Source: Ambulatory Visit

## 2014-05-31 DIAGNOSIS — H5340 Unspecified visual field defects: Secondary | ICD-10-CM | POA: Diagnosis not present

## 2014-05-31 DIAGNOSIS — E119 Type 2 diabetes mellitus without complications: Secondary | ICD-10-CM | POA: Diagnosis not present

## 2014-06-11 ENCOUNTER — Other Ambulatory Visit: Payer: Self-pay | Admitting: Internal Medicine

## 2014-06-11 ENCOUNTER — Other Ambulatory Visit (HOSPITAL_COMMUNITY): Payer: Self-pay | Admitting: *Deleted

## 2014-06-12 DIAGNOSIS — H2512 Age-related nuclear cataract, left eye: Secondary | ICD-10-CM | POA: Diagnosis not present

## 2014-06-12 DIAGNOSIS — Z961 Presence of intraocular lens: Secondary | ICD-10-CM | POA: Diagnosis not present

## 2014-06-12 DIAGNOSIS — H40013 Open angle with borderline findings, low risk, bilateral: Secondary | ICD-10-CM | POA: Diagnosis not present

## 2014-06-15 ENCOUNTER — Ambulatory Visit (HOSPITAL_COMMUNITY): Admission: RE | Admit: 2014-06-15 | Payer: Medicare Other | Source: Ambulatory Visit

## 2014-06-15 ENCOUNTER — Ambulatory Visit (HOSPITAL_COMMUNITY): Payer: Medicare Other

## 2014-06-19 ENCOUNTER — Other Ambulatory Visit: Payer: Self-pay | Admitting: Internal Medicine

## 2014-06-19 NOTE — Telephone Encounter (Signed)
Patient has a history of GERD although not on active problem list at this time.  Also has a history of PUD with hemorrhage on problem list but no associated notes under that problem.  Thus, the current indication for the PPI therapy is unclear.  As it was on his last clinic AVS I will refill it once and ask Dr. Dareen Piano to address the indication should further refills be appropriate.

## 2014-06-26 DIAGNOSIS — M545 Low back pain: Secondary | ICD-10-CM | POA: Diagnosis not present

## 2014-06-27 ENCOUNTER — Other Ambulatory Visit: Payer: Self-pay | Admitting: Orthopedic Surgery

## 2014-06-27 DIAGNOSIS — M545 Low back pain: Secondary | ICD-10-CM

## 2014-07-12 ENCOUNTER — Encounter: Payer: Self-pay | Admitting: Internal Medicine

## 2014-07-12 ENCOUNTER — Inpatient Hospital Stay: Admission: RE | Admit: 2014-07-12 | Payer: Self-pay | Source: Ambulatory Visit

## 2014-07-12 ENCOUNTER — Other Ambulatory Visit: Payer: Self-pay

## 2014-08-01 ENCOUNTER — Other Ambulatory Visit: Payer: Self-pay | Admitting: Internal Medicine

## 2014-08-01 ENCOUNTER — Other Ambulatory Visit (HOSPITAL_COMMUNITY): Payer: Self-pay | Admitting: *Deleted

## 2014-08-01 MED ORDER — CARVEDILOL 25 MG PO TABS: 25.0000 mg | ORAL_TABLET | Freq: Two times a day (BID) | ORAL | Status: DC

## 2014-08-02 DIAGNOSIS — I10 Essential (primary) hypertension: Secondary | ICD-10-CM | POA: Diagnosis not present

## 2014-08-02 DIAGNOSIS — I5022 Chronic systolic (congestive) heart failure: Secondary | ICD-10-CM | POA: Diagnosis not present

## 2014-08-02 DIAGNOSIS — R32 Unspecified urinary incontinence: Secondary | ICD-10-CM | POA: Diagnosis not present

## 2014-08-02 DIAGNOSIS — M4800 Spinal stenosis, site unspecified: Secondary | ICD-10-CM | POA: Diagnosis not present

## 2014-08-03 ENCOUNTER — Ambulatory Visit
Admission: RE | Admit: 2014-08-03 | Discharge: 2014-08-03 | Disposition: A | Discharge status: Home / Self Care | Payer: PRIVATE HEALTH INSURANCE | Source: Ambulatory Visit | Attending: Orthopedic Surgery | Admitting: Orthopedic Surgery

## 2014-08-03 DIAGNOSIS — M5136 Other intervertebral disc degeneration, lumbar region: Secondary | ICD-10-CM | POA: Diagnosis not present

## 2014-08-03 DIAGNOSIS — M47817 Spondylosis without myelopathy or radiculopathy, lumbosacral region: Secondary | ICD-10-CM | POA: Diagnosis not present

## 2014-08-03 DIAGNOSIS — M545 Low back pain: Secondary | ICD-10-CM

## 2014-08-03 DIAGNOSIS — Z981 Arthrodesis status: Secondary | ICD-10-CM | POA: Diagnosis not present

## 2014-08-03 DIAGNOSIS — M5126 Other intervertebral disc displacement, lumbar region: Secondary | ICD-10-CM | POA: Diagnosis not present

## 2014-08-03 DIAGNOSIS — M4806 Spinal stenosis, lumbar region: Secondary | ICD-10-CM | POA: Diagnosis not present

## 2014-08-03 DIAGNOSIS — M5127 Other intervertebral disc displacement, lumbosacral region: Secondary | ICD-10-CM | POA: Diagnosis not present

## 2014-08-17 ENCOUNTER — Other Ambulatory Visit: Payer: Self-pay | Admitting: Internal Medicine

## 2014-08-17 DIAGNOSIS — M5416 Radiculopathy, lumbar region: Secondary | ICD-10-CM | POA: Diagnosis not present

## 2014-08-20 ENCOUNTER — Other Ambulatory Visit: Payer: Self-pay | Admitting: Orthopedic Surgery

## 2014-08-21 ENCOUNTER — Other Ambulatory Visit: Payer: Self-pay | Admitting: Internal Medicine

## 2014-08-23 ENCOUNTER — Other Ambulatory Visit: Payer: Self-pay | Admitting: Internal Medicine

## 2014-08-29 ENCOUNTER — Other Ambulatory Visit (HOSPITAL_COMMUNITY): Payer: Self-pay

## 2014-08-30 ENCOUNTER — Inpatient Hospital Stay (HOSPITAL_COMMUNITY): Admission: RE | Admit: 2014-08-30 | Payer: Self-pay | Source: Ambulatory Visit

## 2014-09-06 ENCOUNTER — Other Ambulatory Visit: Payer: Self-pay | Admitting: Orthopedic Surgery

## 2014-09-06 ENCOUNTER — Inpatient Hospital Stay (HOSPITAL_COMMUNITY)
Admission: RE | Admit: 2014-09-06 | Payer: PRIVATE HEALTH INSURANCE | Source: Ambulatory Visit | Admitting: Orthopedic Surgery

## 2014-09-06 ENCOUNTER — Encounter (HOSPITAL_COMMUNITY): Admission: RE | Payer: Self-pay | Source: Ambulatory Visit

## 2014-09-06 SURGERY — ANTERIOR LATERAL LUMBAR FUSION 1 LEVEL
Anesthesia: General | Laterality: Left

## 2014-09-11 DIAGNOSIS — Z8546 Personal history of malignant neoplasm of prostate: Secondary | ICD-10-CM | POA: Diagnosis not present

## 2014-09-17 ENCOUNTER — Inpatient Hospital Stay (HOSPITAL_COMMUNITY)
Admission: RE | Admit: 2014-09-17 | Discharge: 2014-09-17 | Disposition: A | Discharge status: Home / Self Care | Payer: Self-pay | Source: Ambulatory Visit

## 2014-09-17 ENCOUNTER — Encounter (HOSPITAL_COMMUNITY): Payer: Self-pay

## 2014-09-17 HISTORY — DX: Type 2 diabetes mellitus without complications: E11.9

## 2014-09-18 DIAGNOSIS — Z8546 Personal history of malignant neoplasm of prostate: Secondary | ICD-10-CM | POA: Diagnosis not present

## 2014-09-27 ENCOUNTER — Inpatient Hospital Stay (HOSPITAL_COMMUNITY): Admission: RE | Admit: 2014-09-27 | Payer: Medicare Other | Source: Ambulatory Visit | Admitting: Orthopedic Surgery

## 2014-09-27 ENCOUNTER — Encounter (HOSPITAL_COMMUNITY): Admission: RE | Payer: Self-pay | Source: Ambulatory Visit

## 2014-09-27 SURGERY — ANTERIOR LATERAL LUMBAR FUSION 1 LEVEL
Anesthesia: General | Laterality: Left

## 2014-09-28 ENCOUNTER — Encounter (HOSPITAL_COMMUNITY): Payer: Self-pay

## 2014-12-04 DIAGNOSIS — M4800 Spinal stenosis, site unspecified: Secondary | ICD-10-CM | POA: Diagnosis not present

## 2014-12-04 DIAGNOSIS — I5022 Chronic systolic (congestive) heart failure: Secondary | ICD-10-CM | POA: Diagnosis not present

## 2014-12-04 DIAGNOSIS — I10 Essential (primary) hypertension: Secondary | ICD-10-CM | POA: Diagnosis not present

## 2014-12-04 DIAGNOSIS — R32 Unspecified urinary incontinence: Secondary | ICD-10-CM | POA: Diagnosis not present

## 2015-01-01 DIAGNOSIS — R069 Unspecified abnormalities of breathing: Secondary | ICD-10-CM | POA: Diagnosis not present

## 2015-01-08 DIAGNOSIS — I5022 Chronic systolic (congestive) heart failure: Secondary | ICD-10-CM | POA: Diagnosis not present

## 2015-01-08 DIAGNOSIS — Z8546 Personal history of malignant neoplasm of prostate: Secondary | ICD-10-CM | POA: Diagnosis not present

## 2015-01-08 DIAGNOSIS — M4800 Spinal stenosis, site unspecified: Secondary | ICD-10-CM | POA: Diagnosis not present

## 2015-01-08 DIAGNOSIS — R32 Unspecified urinary incontinence: Secondary | ICD-10-CM | POA: Diagnosis not present

## 2015-01-08 DIAGNOSIS — I10 Essential (primary) hypertension: Secondary | ICD-10-CM | POA: Diagnosis not present

## 2015-01-30 ENCOUNTER — Ambulatory Visit: Payer: Self-pay | Admitting: Pulmonary Disease

## 2015-01-31 DIAGNOSIS — R0602 Shortness of breath: Secondary | ICD-10-CM | POA: Diagnosis not present

## 2015-02-04 ENCOUNTER — Ambulatory Visit: Payer: Self-pay | Admitting: Pulmonary Disease

## 2015-02-12 ENCOUNTER — Encounter: Payer: Self-pay | Admitting: Pulmonary Disease

## 2015-02-12 ENCOUNTER — Ambulatory Visit (INDEPENDENT_AMBULATORY_CARE_PROVIDER_SITE_OTHER): Payer: Medicare Other | Admitting: Pulmonary Disease

## 2015-02-12 VITALS — BP 164/110 | HR 75 | Temp 98.1°F | Wt 274.8 lb

## 2015-02-12 DIAGNOSIS — I480 Paroxysmal atrial fibrillation: Secondary | ICD-10-CM

## 2015-02-12 DIAGNOSIS — E118 Type 2 diabetes mellitus with unspecified complications: Secondary | ICD-10-CM

## 2015-02-12 DIAGNOSIS — Z794 Long term (current) use of insulin: Secondary | ICD-10-CM | POA: Diagnosis not present

## 2015-02-12 DIAGNOSIS — Z7901 Long term (current) use of anticoagulants: Secondary | ICD-10-CM

## 2015-02-12 DIAGNOSIS — I110 Hypertensive heart disease with heart failure: Secondary | ICD-10-CM

## 2015-02-12 DIAGNOSIS — E119 Type 2 diabetes mellitus without complications: Secondary | ICD-10-CM | POA: Diagnosis not present

## 2015-02-12 DIAGNOSIS — I4891 Unspecified atrial fibrillation: Secondary | ICD-10-CM | POA: Diagnosis not present

## 2015-02-12 DIAGNOSIS — I10 Essential (primary) hypertension: Secondary | ICD-10-CM

## 2015-02-12 DIAGNOSIS — I5042 Chronic combined systolic (congestive) and diastolic (congestive) heart failure: Secondary | ICD-10-CM | POA: Diagnosis not present

## 2015-02-12 LAB — BRAIN NATRIURETIC PEPTIDE: B NATRIURETIC PEPTIDE 5: 113.6 pg/mL — AB (ref 0.0–100.0)

## 2015-02-12 LAB — POCT GLYCOSYLATED HEMOGLOBIN (HGB A1C): Hemoglobin A1C: 5.7

## 2015-02-12 LAB — GLUCOSE, CAPILLARY: GLUCOSE-CAPILLARY: 101 mg/dL — AB (ref 65–99)

## 2015-02-12 MED ORDER — RIVAROXABAN 20 MG PO TABS: 20.0000 mg | ORAL_TABLET | Freq: Every day | ORAL | Status: DC

## 2015-02-12 MED ORDER — TORSEMIDE 20 MG PO TABS: 80.0000 mg | ORAL_TABLET | Freq: Two times a day (BID) | ORAL | Status: DC

## 2015-02-12 MED ORDER — INSULIN DETEMIR 100 UNIT/ML FLEXPEN: 10.0000 [IU] | PEN_INJECTOR | Freq: Every day | SUBCUTANEOUS | Status: DC

## 2015-02-12 MED ORDER — POTASSIUM CHLORIDE ER 20 MEQ PO TBCR: 20.0000 meq | EXTENDED_RELEASE_TABLET | Freq: Two times a day (BID) | ORAL | Status: DC

## 2015-02-12 NOTE — Patient Instructions (Addendum)
Stop taking the insulin with meals. Continue to take 10 units of Levemir at night.   Take 80mg  (4 tablets) of torsemide twice a day. Follow up with our clinic on Friday.   Heart Failure Heart failure means your heart has trouble pumping blood. This makes it hard for your body to work well. Heart failure is usually a long-term (chronic) condition. You must take good care of yourself and follow your doctor's treatment plan. HOME CARE  Take your heart medicine as told by your doctor.  Do not stop taking medicine unless your doctor tells you to.  Do not skip any dose of medicine.  Refill your medicines before they run out.  Take other medicines only as told by your doctor or pharmacist.  Stay active if told by your doctor. The elderly and people with severe heart failure should talk with a doctor about physical activity.  Eat heart-healthy foods. Choose foods that are without trans fat and are low in saturated fat, cholesterol, and salt (sodium). This includes fresh or frozen fruits and vegetables, fish, lean meats, fat-free or low-fat dairy foods, whole grains, and high-fiber foods. Lentils and dried peas and beans (legumes) are also good choices.  Limit salt if told by your doctor.  Cook in a healthy way. Roast, grill, broil, bake, poach, steam, or stir-fry foods.  Limit fluids as told by your doctor.  Weigh yourself every morning. Do this after you pee (urinate) and before you eat breakfast. Write down your weight to give to your doctor.  Take your blood pressure and write it down if your doctor tells you to.  Ask your doctor how to check your pulse. Check your pulse as told.  Lose weight if told by your doctor.  Stop smoking or chewing tobacco. Do not use gum or patches that help you quit without your doctor's approval.  Schedule and go to doctor visits as told.  Nonpregnant women should have no more than 1 drink a day. Men should have no more than 2 drinks a day. Talk to your  doctor about drinking alcohol.  Stop illegal drug use.  Stay current with shots (immunizations).  Manage your health conditions as told by your doctor.  Learn to manage your stress.  Rest when you are tired.  If it is really hot outside:  Avoid intense activities.  Use air conditioning or fans, or get in a cooler place.  Avoid caffeine and alcohol.  Wear loose-fitting, lightweight, and light-colored clothing.  If it is really cold outside:  Avoid intense activities.  Layer your clothing.  Wear mittens or gloves, a hat, and a scarf when going outside.  Avoid alcohol.  Learn about heart failure and get support as needed.  Get help to maintain or improve your quality of life and your ability to care for yourself as needed. GET HELP IF:   You gain weight quickly.  You are more short of breath than usual.  You cannot do your normal activities.  You tire easily.  You cough more than normal, especially with activity.  You have any or more puffiness (swelling) in areas such as your hands, feet, ankles, or belly (abdomen).  You cannot sleep because it is hard to breathe.  You feel like your heart is beating fast (palpitations).  You get dizzy or light-headed when you stand up. GET HELP RIGHT AWAY IF:   You have trouble breathing.  There is a change in mental status, such as becoming less alert or not  being able to focus.  You have chest pain or discomfort.  You faint. MAKE SURE YOU:   Understand these instructions.  Will watch your condition.  Will get help right away if you are not doing well or get worse.   This information is not intended to replace advice given to you by your health care provider. Make sure you discuss any questions you have with your health care provider.   Document Released: 10/22/2007 Document Revised: 02/02/2014 Document Reviewed: 02/29/2012 Elsevier Interactive Patient Education [employer].

## 2015-02-12 NOTE — Progress Notes (Signed)
Subjective:    Patient ID: [name] [name], male    DOB: 10-22-1955, 60 y.o.   MRN: [id number]  HPI [name] is a 60 year old man with history of GERD, schizophrenia, HTN, polysubstance abuse, CVA, chronic combined systolic and diastolic CHF, NICM, CKD stage 2, atrial fibrillation, DM presenting for evaluation of dyspnea.  He reports he has had worsening dyspnea for the past several months. He has had worsening orthopnea over the last 6 months. He now has to sleep in an upright position. He reports noncompliance of his medications and takes them sporadically. Last cocaine use was months ago. Denies PND. He is short of breath with normal activity.  He has some bilateral paresthesias in the fifth digit to the level of the wrist for the past 1.5 months. Occasional sharp pain.   Review of Systems Constitutional: no fevers/chills Ears, nose, mouth, throat, and face: no cough Cardiovascular: no chest pain Genitourinary: no dysuria, no hematuria  Past Medical History  Diagnosis Date  . GERD (gastroesophageal reflux disease)   . Schizophrenia, schizo-affective ([place])   . Cataract   . Hyperlipidemia   . Hypertension     uncontrolled with medication noncompliance  . HIT (heparin-induced thrombocytopenia) ([place])   . Continuous chronic alcoholism ([place])   . H/O cocaine abuse   . Stroke ([place]) 01/2012    Small cerebellar infarcts right greater than left as well as questionable acute left external capsule and caudate nuclear punctate lacunar infarcts noted per MRI (01/2012) - presumed to be embolic likely source PFO with right to left shunt (noted per TEE 01/ 2014)  . Chronic combined systolic and diastolic CHF (congestive heart failure) ([facility])     a) EF 40-45% per 2D echo (02/2012) with grade 1 DD b)  NICM c) RHC (04/2012): RA: 4, RV 45/3/4, PA 42/9 (24), PCWP 14, Fick CO/CI: 5.2 /2.2, PVR 1.9 WU, PA 62% and 64% d) ECHO (10/2012) EF 40-45%, grade II DD, RV nl  . Degenerative lumbar  spinal stenosis     s/p L2-3, L3-4, L4-5 laminectomy partial facetectomy, and bilateral foraminotomy  . History of pancreatitis 01/2011    Admission for acute pancreatitis presumed 2/2 ongoing alcohol abuse- and hypertriglyceridemia  . PFO (patent foramen ovale) 01/2012    with right to left shunt, noted per TEE in evaluation for source of embolic stroke in 04/971  . CKD (chronic kidney disease) stage 2, GFR 60-89 ml/min     BL SCr approximately 1-1.3  . 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 Hepatic steatosis     suspected 2/2 alcohol abuse  . Splenic cyst   . Colitis 05/2009    History of colitis of ascending colon noted on CT abd/pelvis (05/2009), with interval resolution with subsequent CT  . Hypertriglyceridemia   . NICM (nonischemic cardiomyopathy) ([place])     a. LHC (04/2012): nl arteries  . Atrial fibrillation ([place])   . Cancer [facility])     Prostate cancer-bx. 3 weeks ago  . Hepatitis C   . Diabetes mellitus without complication [facility])     Current Outpatient Prescriptions on File Prior to Visit  Medication Sig Dispense Refill  . ARIPiprazole (ABILIFY) 20 MG tablet Take 20 mg by mouth at bedtime.    . B-D UF III MINI PEN NEEDLES 31G X 5 MM MISC as directed UPTO 4 TIMES DAILY 150 each 5  . Blood Glucose Monitoring Suppl (ACCU-CHEK NANO SMARTVIEW) W/DEVICE KIT  Use as instructed to test four times a day. DX: 250.92, insulin requiring 1 kit 0  . carvedilol (COREG) 25 MG tablet Take 1 tablet (25 mg total) by mouth 2 (two) times daily with a meal. 60 tablet 6  . cloNIDine (CATAPRES) 0.1 MG tablet Take 1 tablet (0.1 mg total) by mouth 3 (three) times daily. (Patient taking differently: Take 0.1 mg by mouth 2 (two) times daily. ) 90 tablet 0  . diclofenac sodium (VOLTAREN) 1 % GEL Apply 4 g topically 4 (four) times daily. (Patient not taking: Reported on 08/29/2014) 100 g 2  . fexofenadine-pseudoephedrine (ALLEGRA-D) 60-120 MG per  tablet Take 1 tablet by mouth 2 (two) times daily. (Patient not taking: Reported on 08/29/2014) 30 tablet 2  . gabapentin (NEURONTIN) 100 MG capsule Take 1 capsule (100 mg total) by mouth 3 (three) times daily. (Patient not taking: Reported on 08/29/2014) 90 capsule 0  . hydrOXYzine (ATARAX/VISTARIL) 25 MG tablet Take 1 tablet (25 mg total) by mouth every 6 (six) hours as needed for anxiety (or CIWA score </= 10). (Patient not taking: Reported on 08/29/2014) 30 tablet 0  . insulin aspart (NOVOLOG) 100 UNIT/ML injection Inject 6 Units into the skin 3 (three) times daily before meals.    . Insulin Detemir (LEVEMIR FLEXTOUCH) 100 UNIT/ML Pen Inject 40 Units into the skin daily at 10 pm. (Patient taking differently: Inject 50 Units into the skin daily at 10 pm. ) 15 mL 11  . lisinopril (PRINIVIL,ZESTRIL) 40 MG tablet take 1 tablet by mouth once daily 90 tablet 1  . metolazone (ZAROXOLYN) 2.5 MG tablet Take 2.5 mg by mouth daily.    . ONE TOUCH ULTRA TEST test strip USE 4 TIMES DAILY FOR DIABETIC TESTING 400 each 0  . pantoprazole (PROTONIX) 40 MG tablet Take 1 tablet (40 mg total) by mouth daily. 30 tablet 0  . PHARMACIST CHOICE LANCETS MISC USE 4 TIMES DAILY FOR DIABETIC TESTING 400 each 0  . potassium chloride 20 MEQ TBCR Take 20 mEq by mouth 2 (two) times daily. 60 tablet 0  . QUEtiapine (SEROQUEL) 400 MG tablet Take 400 mg by mouth at bedtime.  0  . rivaroxaban (XARELTO) 20 MG TABS tablet Take 1 tablet (20 mg total) by mouth daily. MUST HAVE FOLLOW UP APPOINTMENT FOR FURTHER REFILLS 30 tablet 0  . rosuvastatin (CRESTOR) 20 MG tablet Take 1 tablet (20 mg total) by mouth daily. 90 tablet 3  . spironolactone (ALDACTONE) 25 MG tablet Take 0.5 tablets (12.5 mg total) by mouth daily. (Patient not taking: Reported on 08/29/2014) 45 tablet 3  . torsemide (DEMADEX) 20 MG tablet Take 20 mg by mouth daily.     No current facility-administered medications on file prior to visit.    Today's Vitals   02/12/15 1356  02/12/15 1359  BP: 164/110   Pulse: 75   Temp: 98.1 F (36.7 C)   TempSrc: Oral   Weight: 274 lb 12.8 oz (124.648 kg)   SpO2: 97%   PainSc:  9     Objective:   Physical Exam  Constitutional: He is oriented to person, place, and time. He appears well-developed and well-nourished. No distress.  HENT:  Head: Normocephalic and atraumatic.  Cardiovascular: Normal rate, regular rhythm and normal heart sounds.   Pulmonary/Chest: Effort normal. He has no wheezes. He has rales (trace bibasilar).  Abdominal: Soft. He exhibits no distension. There is no tenderness.  Musculoskeletal: Normal range of motion. He exhibits edema (2+ BLE). He exhibits no tenderness.  Neurological: He is alert and oriented to person, place, and time. Coordination normal.   Assessment & Plan:  Please refer to problem based charting.

## 2015-02-13 LAB — BMP8+ANION GAP
ANION GAP: 18 mmol/L (ref 10.0–18.0)
BUN / CREAT RATIO: 11 (ref 10–22)
BUN: 12 mg/dL (ref 8–27)
CHLORIDE: 101 mmol/L (ref 96–106)
CO2: 23 mmol/L (ref 18–29)
CREATININE: 1.05 mg/dL (ref 0.76–1.27)
Calcium: 9.6 mg/dL (ref 8.6–10.2)
GFR calc Af Amer: 89 mL/min/{1.73_m2} (ref >59)
GFR calc non Af Amer: 77 mL/min/{1.73_m2} (ref >59)
GLUCOSE: 91 mg/dL (ref 65–99)
Potassium: 4.4 mmol/L (ref 3.5–5.2)
SODIUM: 142 mmol/L (ref 134–144)

## 2015-02-14 NOTE — Assessment & Plan Note (Signed)
BP Readings from Last 3 Encounters:  02/12/15 164/110  05/17/14 188/147  04/19/14 160/100    Lab Results  Component Value Date   NA 142 02/12/2015   K 4.4 02/12/2015   CREATININE 1.05 02/12/2015    Assessment: Blood pressure control: Uncontrolled Progress toward BP goal:  Improved Comments: Variable compliance of medications  Plan: Medications:  Continue torsemide, carvedilol 25mg  BID, clonidine 0.1mg  TID, lisinopril 40mg  daily, spironolactone 12.5mg  daily Other plans:  -Encouraged compliance of medications -Follow up in 3 days

## 2015-02-14 NOTE — Assessment & Plan Note (Signed)
Assessment: remains rate controlled  Plan: -Refilled Xarelto

## 2015-02-14 NOTE — Assessment & Plan Note (Signed)
Lab Results  Component Value Date   HGBA1C 5.7 02/12/2015   HGBA1C 6.6* 03/07/2014   HGBA1C 6.4 01/23/2014     Assessment: Diabetes control: Controlled Progress toward A1C goal:  At goal Comments: Does not report any episodes of symptomatic hypoglycemia  Plan: Medications:  Discontinue mealtime coverage. Continue levemir 10u QHS. Other plans:  -Follow up in 6 months.

## 2015-02-14 NOTE — Assessment & Plan Note (Signed)
Assessment: He has not been seen by our clinic or cardiology since 03/2014. Echo in 2014 LV EF 40% - 45% and grade 2 diastolic dysfunction. He has worsening orthopnea and dyspnea. Suspect component of noncompliance with possible chronic worsening of CHF. On torsemide 40mg  BID. BNP 113. BMP unremarkable.  Plan: -Encourage patient to follow up with cardiology -Echo -Torsemide 80mg  BID for 3 days then decrease to 40mg  BID. -Follow up in 3 days -Refilled KCl 57meq daily

## 2015-02-15 ENCOUNTER — Telehealth: Payer: Self-pay | Admitting: *Deleted

## 2015-02-15 NOTE — Telephone Encounter (Signed)
Received PA request from pt's pharmacy.  Pt has dx A-fib, hx  cva-medication approved through 01/26/2016.  Pharmacy aware.Despina Hidden Cassady1/20/20173:40 PM     [phone]

## 2015-02-16 NOTE — Progress Notes (Signed)
Internal Medicine Clinic Attending  Case discussed with Dr. Krall soon after the resident saw the patient.  We reviewed the resident's history and exam and pertinent patient test results.  I agree with the assessment, diagnosis, and plan of care documented in the resident's note. 

## 2015-02-18 ENCOUNTER — Ambulatory Visit: Payer: Self-pay | Admitting: Pulmonary Disease

## 2015-02-18 ENCOUNTER — Encounter: Payer: Self-pay | Admitting: Internal Medicine

## 2015-02-27 ENCOUNTER — Other Ambulatory Visit (HOSPITAL_COMMUNITY): Payer: Self-pay

## 2015-03-05 ENCOUNTER — Encounter (HOSPITAL_COMMUNITY): Payer: Self-pay | Admitting: Internal Medicine

## 2015-03-30 DIAGNOSIS — R069 Unspecified abnormalities of breathing: Secondary | ICD-10-CM | POA: Diagnosis not present

## 2015-04-03 ENCOUNTER — Encounter (HOSPITAL_COMMUNITY): Payer: Self-pay | Admitting: Internal Medicine

## 2015-04-03 NOTE — Addendum Note (Signed)
Addended by: Hulan Fray on: 04/03/2015 05:33 PM   Modules accepted: Orders

## 2015-04-11 ENCOUNTER — Ambulatory Visit: Payer: Self-pay | Admitting: Internal Medicine

## 2015-04-16 ENCOUNTER — Telehealth: Payer: Self-pay | Admitting: Internal Medicine

## 2015-04-16 NOTE — Telephone Encounter (Signed)
APPT. REMINDER CALL, LMTCB °

## 2015-04-17 ENCOUNTER — Encounter: Payer: Self-pay | Admitting: Internal Medicine

## 2015-04-17 ENCOUNTER — Ambulatory Visit: Payer: Self-pay | Admitting: Internal Medicine

## 2015-05-13 DIAGNOSIS — M545 Low back pain: Secondary | ICD-10-CM | POA: Diagnosis not present

## 2015-05-16 ENCOUNTER — Other Ambulatory Visit: Payer: Self-pay | Admitting: Orthopedic Surgery

## 2015-05-22 ENCOUNTER — Encounter (HOSPITAL_COMMUNITY): Payer: Self-pay | Admitting: Internal Medicine

## 2015-05-28 ENCOUNTER — Encounter (HOSPITAL_COMMUNITY)
Admission: RE | Admit: 2015-05-28 | Discharge: 2015-05-28 | Disposition: A | Discharge status: Home / Self Care | Payer: Medicare Other | Source: Ambulatory Visit | Attending: Orthopedic Surgery | Admitting: Orthopedic Surgery

## 2015-05-28 ENCOUNTER — Encounter (HOSPITAL_COMMUNITY): Payer: Self-pay

## 2015-05-28 ENCOUNTER — Encounter (HOSPITAL_COMMUNITY): Payer: Self-pay | Admitting: Emergency Medicine

## 2015-05-28 ENCOUNTER — Ambulatory Visit (HOSPITAL_COMMUNITY)
Admission: RE | Admit: 2015-05-28 | Discharge: 2015-05-28 | Disposition: A | Discharge status: Home / Self Care | Payer: Medicare Other | Source: Ambulatory Visit | Attending: Orthopedic Surgery | Admitting: Orthopedic Surgery

## 2015-05-28 DIAGNOSIS — I445 Left posterior fascicular block: Secondary | ICD-10-CM | POA: Diagnosis not present

## 2015-05-28 DIAGNOSIS — Z01812 Encounter for preprocedural laboratory examination: Secondary | ICD-10-CM | POA: Insufficient documentation

## 2015-05-28 DIAGNOSIS — I459 Conduction disorder, unspecified: Secondary | ICD-10-CM | POA: Diagnosis not present

## 2015-05-28 DIAGNOSIS — M47894 Other spondylosis, thoracic region: Secondary | ICD-10-CM | POA: Diagnosis not present

## 2015-05-28 DIAGNOSIS — I517 Cardiomegaly: Secondary | ICD-10-CM | POA: Diagnosis not present

## 2015-05-28 DIAGNOSIS — Z01810 Encounter for preprocedural cardiovascular examination: Secondary | ICD-10-CM | POA: Diagnosis not present

## 2015-05-28 DIAGNOSIS — Z01818 Encounter for other preprocedural examination: Secondary | ICD-10-CM

## 2015-05-28 DIAGNOSIS — R9431 Abnormal electrocardiogram [ECG] [EKG]: Secondary | ICD-10-CM | POA: Insufficient documentation

## 2015-05-28 HISTORY — DX: Family history of other specified conditions: Z84.89

## 2015-05-28 HISTORY — DX: Personal history of pneumonia (recurrent): Z87.01

## 2015-05-28 HISTORY — DX: Cardiac arrhythmia, unspecified: I49.9

## 2015-05-28 HISTORY — DX: Reserved for inherently not codable concepts without codable children: IMO0001

## 2015-05-28 LAB — CBC WITH DIFFERENTIAL/PLATELET
Basophils Absolute: 0 10*3/uL (ref 0.0–0.1)
Basophils Relative: 1 %
EOS ABS: 0.2 10*3/uL (ref 0.0–0.7)
Eosinophils Relative: 3 %
HCT: 47 % (ref 39.0–52.0)
HEMOGLOBIN: 15 g/dL (ref 13.0–17.0)
LYMPHS ABS: 1.5 10*3/uL (ref 0.7–4.0)
LYMPHS PCT: 23 %
MCH: 28.7 pg (ref 26.0–34.0)
MCHC: 31.9 g/dL (ref 30.0–36.0)
MCV: 89.9 fL (ref 78.0–100.0)
Monocytes Absolute: 0.7 10*3/uL (ref 0.1–1.0)
Monocytes Relative: 12 %
NEUTROS PCT: 61 %
Neutro Abs: 3.9 10*3/uL (ref 1.7–7.7)
Platelets: 187 10*3/uL (ref 150–400)
RBC: 5.23 MIL/uL (ref 4.22–5.81)
RDW: 14.2 % (ref 11.5–15.5)
WBC: 6.3 10*3/uL (ref 4.0–10.5)

## 2015-05-28 LAB — URINALYSIS, ROUTINE W REFLEX MICROSCOPIC
Bilirubin Urine: NEGATIVE
GLUCOSE, UA: NEGATIVE mg/dL
HGB URINE DIPSTICK: NEGATIVE
Ketones, ur: NEGATIVE mg/dL
Leukocytes, UA: NEGATIVE
Nitrite: NEGATIVE
Protein, ur: 100 mg/dL — AB
SPECIFIC GRAVITY, URINE: 1.007 (ref 1.005–1.030)
pH: 6 (ref 5.0–8.0)

## 2015-05-28 LAB — URINE MICROSCOPIC-ADD ON: WBC UA: NONE SEEN WBC/hpf (ref 0–5)

## 2015-05-28 LAB — COMPREHENSIVE METABOLIC PANEL
ALK PHOS: 86 U/L (ref 38–126)
ALT: 34 U/L (ref 17–63)
AST: 30 U/L (ref 15–41)
Albumin: 4 g/dL (ref 3.5–5.0)
Anion gap: 9 (ref 5–15)
BUN: 18 mg/dL (ref 6–20)
CALCIUM: 10.6 mg/dL — AB (ref 8.9–10.3)
CO2: 23 mmol/L (ref 22–32)
CREATININE: 1.35 mg/dL — AB (ref 0.61–1.24)
Chloride: 109 mmol/L (ref 101–111)
GFR calc non Af Amer: 55 mL/min — ABNORMAL LOW (ref >60)
GLUCOSE: 98 mg/dL (ref 65–99)
Potassium: 4.4 mmol/L (ref 3.5–5.1)
SODIUM: 141 mmol/L (ref 135–145)
Total Bilirubin: 0.9 mg/dL (ref 0.3–1.2)
Total Protein: 8 g/dL (ref 6.5–8.1)

## 2015-05-28 LAB — APTT: aPTT: 32 seconds (ref 24–37)

## 2015-05-28 LAB — PROTIME-INR
INR: 1.16 (ref 0.00–1.49)
Prothrombin Time: 15 seconds (ref 11.6–15.2)

## 2015-05-28 LAB — SURGICAL PCR SCREEN
MRSA, PCR: NEGATIVE
Staphylococcus aureus: NEGATIVE

## 2015-05-28 LAB — GLUCOSE, CAPILLARY: Glucose-Capillary: 108 mg/dL — ABNORMAL HIGH (ref 65–99)

## 2015-05-28 NOTE — Progress Notes (Signed)
PCP- Dr. Aldine Contes Cardiologist - Dr. Haroldine Laws  EKG- 05/28/15 CXR - 05/28/15  Echo - 2014 Stress test/cardiac cath - denies  Patient denies chest pain but complains of shortness of breat at PAT appointment.  Patient states that he has been having shortness of breath and states "I needed to take an extra fluid pill."    Patient verbalizes that he has not seen his cardiologist and that he has an upcoming appointment with his PCP on 05/30/15.   He also informs nurse that he has been unable to have Xarelto filled and that he has not taken it over a month.    Patient denies checking blood sugars at home and denies taking diabetes medication.  Nurse encouraged patient to call short stay department if started on blood sugar medications so pre-op instructions could be given.    Willeen Cass, NP consulted during PAT.

## 2015-05-28 NOTE — Progress Notes (Signed)
Anesthesia Consult:  Pt is a 60 year old male scheduled for L2-3 lateral interbody fusion on 06/06/2015 with Dr. Lynann Bologna.   Pt receives primary care at [facility]. Was a patient at HF clinic, but has several no shows/cancelled appointments listed; last office visit in 2015.   PMH includes: CHF, nonischemic cardiomyopathy, atrial fibrillation, PFO (by TEE 02/22/12), HTN, stroke (2014), hyperlipidemia, DM, CKD, schizophrenia, hepatitis C, pancreatitis and hepatic steatosis thought to be due to alcohol abuse, chronic alcoholism, hx cocaine abuse, HIT, prostate cancer, GERD. Former smoker. BMI 38. S/p prostatectomy 12/16/12. S/p lumbar fusion 05/13/11.   Medications include: carvedilol, clonidine, doxepin, lisinopril, potassium, seroquel, xarelto, crestor, spironolactone, torsemide. Pt has been out of xarelto for months.   Preoperative labs reviewed.  HgbA1c 6.1, glucose 98.   Chest x-ray 05/28/15 reviewed. No active cardiopulmonary disease. Degenerative changes mid and lower thoracic spine. Borderline cardiomegaly.  EKG 05/28/15: Sinus rhythm with 1st degree A-V block with PACs. Left posterior fascicular block.   Echo 10/27/12:  - Left ventricle: The cavity size was normal. Wall thickness was increased in a pattern of moderate LVH. Systolic function was mildly to moderately reduced. The estimated ejection fraction was in the range of 40% to 45%. Wall motion was normal; there were no regional wall motion abnormalities. Features are consistent with a pseudonormal left ventricular filling pattern, with concomitant abnormal relaxation and increased filling pressure (grade 2 diastolic dysfunction). Doppler parameters are consistent with elevated ventricular end-diastolic filling pressure. - Left atrium: The atrium was moderately dilated. - Right ventricle: The cavity size was mildly dilated. Systolic function was normal. - Right atrium: The atrium was mildly dilated. - Atrial septum: No  defect or patent foramen ovale was identified. - Pulmonary arteries: Systolic pressure was within the normal range.  I was called by PAT RN to see pt for SOB. VSS, O2 sat 96%. Lungs CTA B. HRRR. 1-2+ pitting edema bilaterally.  Pt denies recent weigh change or change in edema. C/o SOB for "months" but worse recently. Reports taking diuretic as prescribed. Review of chart finds pt has not followed up with HF clinic as instructed (many no shows), last office visit in 2015.  Pt did have an office visit with [facility] 02/14/15 where pt was also complaining of SOB. Echo was ordered as prior echo from 2.5 years ago showed reduced EF. Pt has not gotten repeat echo. Pt does have an appt 05/30/15 with Dr. Loleta Chance at the [facility] for what appears to be a pre-op medical clearance appt. I have reached out to Dr. Melburn Hake with pt's symptoms and a request to get echo prior to surgery. Will revisit chart after appt with Dr. Melburn Hake.   Willeen Cass, FNP-BC [facility]/Anesthesiology Phone: [phone] 05/29/2015 2:06 PM

## 2015-05-28 NOTE — Pre-Procedure Instructions (Addendum)
[name] [name]  05/28/2015     Your procedure is scheduled on : Thursday Jun 06, 2015 at 11:46 AM.  Report to [facility] Admitting at 8:45 AM.  Call this number if you have problems the morning of surgery: [phone]    Remember:  Do not eat food or drink liquids after midnight.  Take these medicines the morning of surgery with A SIP OF WATER : Carvedilol (Coreg), Clonidine (Catapres), Tramadol (Ultram) if needed   Please follow your physicians instructions regarding Xarelto   Stop taking any vitamins, herbal medications/supplements, NSAIDs, Ibuprofen, Advil, Motrin, Aleve, etc on Thursday May 4th  WHAT DO I DO ABOUT MY DIABETES MEDICATION?   *Please call [phone] after visiting your PCP so we can update you on how to take your diabetes medication prior to surgery**      How to Manage Your Diabetes Before and After Surgery  Why is it important to control my blood sugar before and after surgery? . Improving blood sugar levels before and after surgery helps healing and can limit problems. . A way of improving blood sugar control is eating a healthy diet by: o  Eating less sugar and carbohydrates o  Increasing activity/exercise o  Talking with your doctor about reaching your blood sugar goals . High blood sugars (greater than 180 mg/dL) can raise your risk of infections and slow your recovery, so you will need to focus on controlling your diabetes during the weeks before surgery. . Make sure that the doctor who takes care of your diabetes knows about your planned surgery including the date and location.  How do I manage my blood sugar before surgery? . Check your blood sugar at least 4 times a day, starting 2 days before surgery, to make sure that the level is not too high or low. o Check your blood sugar the morning of your surgery when you wake up and every 2 hours until you get to the Short Stay unit. . If your blood sugar is less than 70 mg/dL, you will need  to treat for low blood sugar: o Do not take insulin. o Treat a low blood sugar (less than 70 mg/dL) with  cup of clear juice (cranberry or apple), 4 glucose tablets, OR glucose gel. o Recheck blood sugar in 15 minutes after treatment (to make sure it is greater than 70 mg/dL). If your blood sugar is not greater than 70 mg/dL on recheck, call [phone] for further instructions. . Report your blood sugar to the short stay nurse when you get to Short Stay.  . If you are admitted to the hospital after surgery: o Your blood sugar will be checked by the staff and you will probably be given insulin after surgery (instead of oral diabetes medicines) to make sure you have good blood sugar levels. o The goal for blood sugar control after surgery is 80-180 mg/dL.       Do not wear jewelry.  Do not wear lotions, powders, or cologne.   Men may shave face and neck.  Do not bring valuables to the hospital.  [facility] is not responsible for any belongings or valuables.  Contacts, dentures or bridgework may not be worn into surgery.  Leave your suitcase in the car.  After surgery it may be brought to your room.  For patients admitted to the hospital, discharge time will be determined by your treatment team.  Patients discharged the day of surgery will not be allowed to drive  home.   Name and phone number of your driver:    Special instructions:  Shower using CHG soap the night before and the morning of your surgery  Please read over the following fact sheets that you were given. Pain Booklet, Coughing and Deep Breathing, Blood Transfusion Information, MRSA Information and Surgical Site Infection Prevention

## 2015-05-29 ENCOUNTER — Telehealth: Payer: Self-pay | Admitting: Internal Medicine

## 2015-05-29 LAB — HEMOGLOBIN A1C
Hgb A1c MFr Bld: 6.1 % — ABNORMAL HIGH (ref 4.8–5.6)
MEAN PLASMA GLUCOSE: 128 mg/dL

## 2015-05-29 LAB — TYPE AND SCREEN
ABO/RH(D): A NEG
Antibody Screen: NEGATIVE

## 2015-05-29 NOTE — Telephone Encounter (Signed)
APT. REMINDER CALL, LMTCB °

## 2015-05-30 ENCOUNTER — Encounter: Payer: Self-pay | Admitting: Internal Medicine

## 2015-05-30 ENCOUNTER — Ambulatory Visit (HOSPITAL_COMMUNITY)
Admission: RE | Admit: 2015-05-30 | Discharge: 2015-05-30 | Disposition: A | Discharge status: Home / Self Care | Payer: Medicare Other | Source: Ambulatory Visit | Attending: Internal Medicine | Admitting: Internal Medicine

## 2015-05-30 ENCOUNTER — Ambulatory Visit (INDEPENDENT_AMBULATORY_CARE_PROVIDER_SITE_OTHER): Payer: Medicare Other | Admitting: Internal Medicine

## 2015-05-30 ENCOUNTER — Other Ambulatory Visit: Payer: Self-pay

## 2015-05-30 VITALS — BP 132/97 | HR 86 | Temp 98.2°F | Ht 71.0 in | Wt 277.3 lb

## 2015-05-30 DIAGNOSIS — I440 Atrioventricular block, first degree: Secondary | ICD-10-CM | POA: Diagnosis not present

## 2015-05-30 DIAGNOSIS — Z01810 Encounter for preprocedural cardiovascular examination: Secondary | ICD-10-CM | POA: Insufficient documentation

## 2015-05-30 DIAGNOSIS — I498 Other specified cardiac arrhythmias: Secondary | ICD-10-CM | POA: Insufficient documentation

## 2015-05-30 DIAGNOSIS — R9431 Abnormal electrocardiogram [ECG] [EKG]: Secondary | ICD-10-CM | POA: Diagnosis not present

## 2015-05-30 DIAGNOSIS — Z01818 Encounter for other preprocedural examination: Secondary | ICD-10-CM | POA: Diagnosis not present

## 2015-05-30 DIAGNOSIS — I5042 Chronic combined systolic (congestive) and diastolic (congestive) heart failure: Secondary | ICD-10-CM | POA: Diagnosis not present

## 2015-05-30 DIAGNOSIS — M5416 Radiculopathy, lumbar region: Secondary | ICD-10-CM | POA: Diagnosis not present

## 2015-05-30 MED ORDER — TORSEMIDE 20 MG PO TABS: 80.0000 mg | ORAL_TABLET | Freq: Two times a day (BID) | ORAL | Status: DC

## 2015-05-30 MED ORDER — SPIRONOLACTONE 25 MG PO TABS: 25.0000 mg | ORAL_TABLET | Freq: Every day | ORAL | Status: DC

## 2015-05-30 MED ORDER — LISINOPRIL 40 MG PO TABS: 40.0000 mg | ORAL_TABLET | Freq: Every day | ORAL | Status: DC

## 2015-05-30 NOTE — Assessment & Plan Note (Addendum)
I do not think he is medically safe to undergo his orthopedic surgery next week for two reasons: he is currently in decompensated heart failure, and he may have developed coronary artery disease since his left heart cath in 2014.  We will diurese him with torsemide 80mg  twice daily and spironolactone 25mg  daily, and he will follow with me on Tuesday to re-check his volume status. He'll get an transthoracic echocardiogram on Tuesday as well.  He also has new Q waves in his lateral leads since 2014. I've scheduled a lexiscan stress test to evaluate for coronary artery disease and I've placed a referral to Cardiology for further assistance.

## 2015-05-30 NOTE — Progress Notes (Signed)
Patient ID: [name] [name], male   DOB: May 09, 1955, 60 y.o.   MRN: [id number]  [facility] Subjective:   Patient ID: [name] [name] male   DOB: 1956-01-01 60 y.o.   MRN: [id number]  HPI: [name] is a 60 y.o. male with a relevant history of heart failure with reduced ejection fraction 40-45% with no coronary artery disease based off of a left heart cath in 2014, paroxysmal atrial fibrillation not on anticoagulation, hypertension,  below who presents for pre-operative clearance for lumbar laminectomy.  Pre-operative clearance: He has heart failure with reduced ejection fraction of 40-45% with clean coronaries based off of TTE and LHC in 2014. He tells me he can walk up 1 flight of stairs before he feels short of breath. He can only sleep in a recliner and cannot lay flat without feeling short of breath. He takes torsemide 20mg  twice daily and does not take his spironolactone.  He is not smoking and I have reviewed his medications with him today.  Review of Systems  Constitutional: Negative for fever, chills, weight loss and malaise/fatigue.  Respiratory: Negative for cough, shortness of breath and wheezing.   Cardiovascular: Positive for orthopnea and leg swelling. Negative for chest pain, palpitations, claudication and PND.  Musculoskeletal: Positive for back pain.  Neurological: Negative for dizziness and headaches.  Psychiatric/Behavioral: Negative for depression.   Objective:  Physical Exam: Filed Vitals:   05/30/15 1505  BP: 132/97  Pulse: 86  Temp: 98.2 F (36.8 C)  TempSrc: Oral  Height: 5\' 11"  (1.803 m)  Weight: 277 lb 4.8 oz (125.782 kg)  SpO2: 96%   General: friendly man resting in chair comfortably, appropriately conversational Cardiac: regular rate and rhythm, no rubs, murmurs or gallops, unable to assess JVD due to short fat neck Pulm: breathing well, bibasilar crackles Abd: bowel sounds normal, soft, nondistended,  non-tender Ext: warm and well perfused, with 3+ pedal edema to knees bilaterally  Assessment & Plan:  Case discussed with Dr. Dareen Piano  Pre-operative clearance I do not think he is medically safe to undergo his orthopedic surgery next week for two reasons: he is currently in decompensated heart failure, and he may have developed coronary artery disease since his left heart cath in 2014.  We will diurese him with torsemide 80mg  twice daily and spironolactone 25mg  daily, and he will follow with me on Tuesday to re-check his volume status. He'll get an transthoracic echocardiogram on Tuesday as well.  He also has new Q waves in his lateral leads since 2014. I've scheduled a lexiscan stress test to evaluate for coronary artery disease and I've placed a referral to Cardiology for further assistance.   Medications Ordered Meds ordered this encounter  Medications  . torsemide (DEMADEX) 20 MG tablet    Sig: Take 4 tablets (80 mg total) by mouth 2 (two) times daily.    Dispense:  90 tablet    Refill:  0  . spironolactone (ALDACTONE) 25 MG tablet    Sig: Take 1 tablet (25 mg total) by mouth daily.    Dispense:  90 tablet    Refill:  3  . lisinopril (PRINIVIL,ZESTRIL) 40 MG tablet    Sig: Take 1 tablet (40 mg total) by mouth daily.    Dispense:  90 tablet    Refill:  1   Other Orders Orders Placed This Encounter  Procedures  . Ambulatory referral to Cardiology    Referral Priority:  Routine    Referral Type:  Consultation  Referral Reason:  Specialty Services Required    Requested Specialty:  Cardiology    Number of Visits Requested:  1  . Myocardial Perfusion Imaging    Standing Status: Future     Number of Occurrences:      Standing Expiration Date: 05/29/2016    Order Specific Question:  Where should this test be performed    Answer:  [facility])    Order Specific Question:  Type of stress    Answer:  Lexiscan    Order Specific Question:  Patient weight  in lbs    Answer:  277  . EKG 12-Lead  . Echocardiogram    Standing Status: Future     Number of Occurrences:      Standing Expiration Date: 08/29/2016    Order Specific Question:  Where should this test be performed    Answer:  [place]    Order Specific Question:  Complete or Limited study?    Answer:  Complete    Order Specific Question:  Does the patient have a known history of hypersensitivity to Perflutren?    Answer:  Yes - Without Imaging Enhancing Agent    Order Specific Question:  Expected Date:    Answer:  ASAP    Order Specific Question:  Reason for exam-Echo    Answer:  Congestive Heart Failure  428.0 / I50.9   Follow Up: Return in about 1 week (around 06/06/2015) for follow up heart failure and pre-op clearance.

## 2015-05-31 NOTE — Progress Notes (Signed)
Internal Medicine Clinic Attending  Case discussed with Dr. Flores at the time of the visit.  We reviewed the resident's history and exam and pertinent patient test results.  I agree with the assessment, diagnosis, and plan of care documented in the resident's note. 

## 2015-06-03 ENCOUNTER — Telehealth: Payer: Self-pay | Admitting: Internal Medicine

## 2015-06-03 NOTE — Telephone Encounter (Signed)
APT. REMINDER CALL, LMTCB °

## 2015-06-04 ENCOUNTER — Ambulatory Visit: Payer: Medicare Other | Admitting: Internal Medicine

## 2015-06-04 ENCOUNTER — Ambulatory Visit (HOSPITAL_COMMUNITY)
Admission: RE | Admit: 2015-06-04 | Discharge: 2015-06-04 | Disposition: A | Discharge status: Home / Self Care | Payer: Medicare Other | Source: Ambulatory Visit | Attending: Internal Medicine | Admitting: Internal Medicine

## 2015-06-04 DIAGNOSIS — E669 Obesity, unspecified: Secondary | ICD-10-CM | POA: Diagnosis not present

## 2015-06-04 DIAGNOSIS — I110 Hypertensive heart disease with heart failure: Secondary | ICD-10-CM | POA: Insufficient documentation

## 2015-06-04 DIAGNOSIS — I509 Heart failure, unspecified: Secondary | ICD-10-CM | POA: Diagnosis present

## 2015-06-04 DIAGNOSIS — E119 Type 2 diabetes mellitus without complications: Secondary | ICD-10-CM | POA: Insufficient documentation

## 2015-06-04 DIAGNOSIS — Z6838 Body mass index (BMI) 38.0-38.9, adult: Secondary | ICD-10-CM | POA: Insufficient documentation

## 2015-06-04 DIAGNOSIS — Z01818 Encounter for other preprocedural examination: Secondary | ICD-10-CM | POA: Diagnosis not present

## 2015-06-04 DIAGNOSIS — I5042 Chronic combined systolic (congestive) and diastolic (congestive) heart failure: Secondary | ICD-10-CM

## 2015-06-04 NOTE — Progress Notes (Signed)
Echocardiogram 2D Echocardiogram has been performed.  Tresa Res 06/04/2015, 3:52 PM

## 2015-06-05 ENCOUNTER — Encounter (HOSPITAL_COMMUNITY): Payer: Self-pay

## 2015-06-05 ENCOUNTER — Telehealth (HOSPITAL_COMMUNITY): Payer: Self-pay | Admitting: Vascular Surgery

## 2015-06-05 NOTE — Telephone Encounter (Signed)
Returned pt call to cancel / reschedule appt for today

## 2015-06-06 ENCOUNTER — Inpatient Hospital Stay (HOSPITAL_COMMUNITY): Admission: RE | Admit: 2015-06-06 | Payer: Medicare Other | Source: Ambulatory Visit | Admitting: Orthopedic Surgery

## 2015-06-06 ENCOUNTER — Encounter (HOSPITAL_COMMUNITY): Admission: RE | Payer: Self-pay | Source: Ambulatory Visit

## 2015-06-06 SURGERY — ANTERIOR LATERAL LUMBAR FUSION 1 LEVEL
Anesthesia: General | Laterality: Left

## 2015-06-07 ENCOUNTER — Ambulatory Visit (INDEPENDENT_AMBULATORY_CARE_PROVIDER_SITE_OTHER): Payer: Medicare Other | Admitting: Internal Medicine

## 2015-06-07 ENCOUNTER — Encounter: Payer: Self-pay | Admitting: Internal Medicine

## 2015-06-07 VITALS — BP 151/97 | HR 106 | Temp 98.5°F | Ht 71.0 in | Wt 274.3 lb

## 2015-06-07 DIAGNOSIS — I480 Paroxysmal atrial fibrillation: Secondary | ICD-10-CM | POA: Insufficient documentation

## 2015-06-07 DIAGNOSIS — Z7289 Other problems related to lifestyle: Secondary | ICD-10-CM | POA: Diagnosis not present

## 2015-06-07 DIAGNOSIS — M545 Low back pain: Secondary | ICD-10-CM

## 2015-06-07 DIAGNOSIS — I5020 Unspecified systolic (congestive) heart failure: Secondary | ICD-10-CM | POA: Insufficient documentation

## 2015-06-07 DIAGNOSIS — G8929 Other chronic pain: Secondary | ICD-10-CM | POA: Diagnosis not present

## 2015-06-07 DIAGNOSIS — I5042 Chronic combined systolic (congestive) and diastolic (congestive) heart failure: Secondary | ICD-10-CM

## 2015-06-07 DIAGNOSIS — I509 Heart failure, unspecified: Secondary | ICD-10-CM | POA: Diagnosis not present

## 2015-06-07 DIAGNOSIS — R768 Other specified abnormal immunological findings in serum: Secondary | ICD-10-CM | POA: Insufficient documentation

## 2015-06-07 DIAGNOSIS — K739 Chronic hepatitis, unspecified: Secondary | ICD-10-CM | POA: Diagnosis not present

## 2015-06-07 DIAGNOSIS — M549 Dorsalgia, unspecified: Secondary | ICD-10-CM

## 2015-06-07 DIAGNOSIS — K254 Chronic or unspecified gastric ulcer with hemorrhage: Secondary | ICD-10-CM

## 2015-06-07 DIAGNOSIS — R894 Abnormal immunological findings in specimens from other organs, systems and tissues: Secondary | ICD-10-CM

## 2015-06-07 DIAGNOSIS — Z8711 Personal history of peptic ulcer disease: Secondary | ICD-10-CM

## 2015-06-07 LAB — BASIC METABOLIC PANEL
Anion gap: 10 (ref 5–15)
BUN: 18 mg/dL (ref 6–20)
CALCIUM: 10.6 mg/dL — AB (ref 8.9–10.3)
CO2: 22 mmol/L (ref 22–32)
CREATININE: 1.21 mg/dL (ref 0.61–1.24)
Chloride: 112 mmol/L — ABNORMAL HIGH (ref 101–111)
GFR calc Af Amer: >60 mL/min (ref >60)
GLUCOSE: 131 mg/dL — AB (ref 65–99)
Potassium: 4.2 mmol/L (ref 3.5–5.1)
Sodium: 144 mmol/L (ref 135–145)

## 2015-06-07 LAB — GLUCOSE, CAPILLARY: Glucose-Capillary: 122 mg/dL — ABNORMAL HIGH (ref 65–99)

## 2015-06-07 MED ORDER — ASPIRIN EC 81 MG PO TBEC: 81.0000 mg | DELAYED_RELEASE_TABLET | Freq: Every day | ORAL | Status: DC

## 2015-06-07 MED ORDER — ACETAMINOPHEN 500 MG PO TABS: 1000.0000 mg | ORAL_TABLET | Freq: Three times a day (TID) | ORAL | Status: DC | PRN

## 2015-06-07 MED ORDER — RIVAROXABAN 20 MG PO TABS: 20.0000 mg | ORAL_TABLET | Freq: Every day | ORAL | Status: DC

## 2015-06-07 MED ORDER — TORSEMIDE 20 MG PO TABS: 80.0000 mg | ORAL_TABLET | Freq: Two times a day (BID) | ORAL | Status: DC

## 2015-06-07 MED ORDER — PANTOPRAZOLE SODIUM 40 MG PO TBEC: 40.0000 mg | DELAYED_RELEASE_TABLET | Freq: Every day | ORAL | Status: DC

## 2015-06-07 NOTE — Assessment & Plan Note (Addendum)
Assessment: His echo last week showed newly-reduced ejection fraction of 20-25%; given his new Q waves, I'm quite concerned he has developed coronary artery disease. He weighs 274lbs today, compared to 277lbs last week, but his dry weight appears to be around 256lbs from a discharge summary in 2014, so we still have some room for diuresis.  Plan: A stat BMP today showed stable renal function and electrolytes. Because his dyspnea on exertion continues to improve, we will hold the same course of diuretics with torsemide 80mg  twice daily, spironolactone 25mg  daily, and lisinopril 40mg  daily. I'm quite concerned he has underlying coronary artery disease so we've started aspirin 81mg  daily. He has an appointment with cardiology next Friday. I re-emphasized the importance of keeping this appointment. He is scheduled for a stress test the following week; I'll keep this on the books for now and defer to cardiology whether he needs a left heart catheterization instead.

## 2015-06-07 NOTE — Assessment & Plan Note (Signed)
History: He has paroxysmal atrial fibrillation with embolic strokes in 123456. He had been on rivaroxaban but had stopped because his re-fills ran out. He denies any melena or hematochezia.  Assessment: He has paroxysmal atrial fibrillation with embolic strokes so he needs to be on anticoagulation.  Plan: I've re-started rivaroxaban 20mg  daily.

## 2015-06-07 NOTE — Assessment & Plan Note (Addendum)
Assessment: He has numerous hepatitis C antibody positive tests but no RNA levels on file.  Plan: I've checked an HCV RNA level and will refer him to infectious disease if positive.  Addendum: Hepatitis C RNA level was undetectable, indicated he is immune but does not have active viremia, so no further work-up is needed.

## 2015-06-07 NOTE — Progress Notes (Addendum)
Patient ID: [name] [name], male   DOB: 01-11-1956, 60 y.o.   MRN: [id number] [facility] Subjective:   Patient ID: [name] [name] male   DOB: 08/08/1955 60 y.o.   MRN: [id number]  HPI: [name] is a 60 y.o. male with a relevant history of newly-diagnosed heart failure with reduced ejection fraction of 20-25%, schizo-affective disorder, paroxysmal atrial fibrillation with embolic strokes not on anticoagulation, hepatitis C antibody positive, here for follow-up of heart failure.  Heart failure: I saw him last week for pre-operative clearance for a lumbar fusion surgery. At that visit, he was in decompensated heart failure with new Q-wave changes in the lateral leads so we doubled his diuretic doses and ordered a TTE that subsequently showed newly-reduced ejection fraction from 40-45% in 2014 to 20-25% most recently. Since then, he feels he can now walk 2 blocks compared to 1 before feeling short of breath which is the best he's felt in the last year. He still has unchanged 3-pillow orthopnea but he does not complain of any chest pain.  He is not smoking and I have reviewed his medications with him today.  Review of Systems  Constitutional: Negative for fever, chills, malaise/fatigue and diaphoresis.  Respiratory: Positive for shortness of breath. Negative for cough and wheezing.   Cardiovascular: Positive for orthopnea and leg swelling. Negative for chest pain, palpitations, claudication and PND.  Musculoskeletal: Positive for back pain.  Psychiatric/Behavioral: Negative for depression, hallucinations and substance abuse.   Objective:  Physical Exam: Filed Vitals:   06/07/15 1353  BP: 151/97  Pulse: 106  Temp: 98.5 F (36.9 C)  TempSrc: Oral  Height: 5\' 11"  (1.803 m)  Weight: 274 lb 4.8 oz (124.422 kg)  SpO2: 95%   General: friendly black man resting in chair comfortably, appropriately conversational HEENT: no scleral icterus, extra-ocular  muscles intact, oropharynx without lesions Cardiac: tachycardic to 100 but regular rate and rhythm, no rubs, murmurs or gallops Pulm: breathing well, subtle bibasilar crackles improved from last week Abd: bowel sounds normal, soft, nondistended, non-tender Ext: warm and well perfused, with 3+ pedal edema, unchanged from last week Neuro: alert and oriented X3, cranial nerves II-XII grossly intact, moving all extremities well  Assessment & Plan:  Case discussed with Dr. Lynnae January  Heart failure with reduced ejection fraction, NYHA class III (HCC) Assessment: His echo last week showed newly-reduced ejection fraction of 20-25%; given his new Q waves, I'm quite concerned he has developed coronary artery disease. He weighs 274lbs today, compared to 277lbs last week, but his dry weight appears to be around 256lbs from a discharge summary in 2014, so we still have some room for diuresis.  Plan: A stat BMP today showed stable renal function and electrolytes. Because his dyspnea on exertion continues to improve, we will hold the same course of diuretics with torsemide 80mg  twice daily, spironolactone 25mg  daily, and lisinopril 40mg  daily. I'm quite concerned he has underlying coronary artery disease so we've started aspirin 81mg  daily. He has an appointment with cardiology next Friday. I re-emphasized the importance of keeping this appointment. He is scheduled for a stress test the following week; I'll keep this on the books for now and defer to cardiology whether he needs a left heart catheterization instead.  Paroxysmal atrial fibrillation (HCC) History: He has paroxysmal atrial fibrillation with embolic strokes in 123456. He had been on rivaroxaban but had stopped because his re-fills ran out. He denies any melena or hematochezia.  Assessment: He has paroxysmal  atrial fibrillation with embolic strokes so he needs to be on anticoagulation.  Plan: I've re-started rivaroxaban 20mg  daily.  Gastric ulcer with  hemorrhage History: He had a gastric ulcer bleed in 2014. He is not currently on a PPI. He denies any melena or hematochezia.  Assessment: Because he has a history of gastric ulcer and we're starting both aspirin and rivaroxaban, I'll start a PPI.  Plan: We've started pantoprazole 40mg  daily.   Chronic back pain greater than 3 months duration History: He has chronic lower back pain and underwent lumbar fusion in 2013. He is still in significant pain.  Assessment: He has Tramadol prescribed by his surgeon and I'll add acetaminopehn dosed for presumptive cirrhosis to help him out. W  Plan: We started acetaminophen 1000mg  three times daily as needed for back pain.   Hepatitis C antibody test positive Assessment: He has numerous hepatitis C antibody positive tests but no RNA levels on file.  Plan: I've checked an HCV RNA level and will refer him to infectious disease if positive.  Addendum: Hepatitis C RNA level was undetectable, indicated he is immune but does not have active viremia, so no further work-up is needed.   Medications Ordered Meds ordered this encounter  Medications  . rivaroxaban (XARELTO) 20 MG TABS tablet    Sig: Take 1 tablet (20 mg total) by mouth daily.    Dispense:  90 tablet    Refill:  3  . aspirin EC 81 MG tablet    Sig: Take 1 tablet (81 mg total) by mouth daily.    Dispense:  90 tablet    Refill:  3  . pantoprazole (PROTONIX) 40 MG tablet    Sig: Take 1 tablet (40 mg total) by mouth daily.    Dispense:  30 tablet    Refill:  1  . acetaminophen (TYLENOL) 500 MG tablet    Sig: Take 2 tablets (1,000 mg total) by mouth every 8 (eight) hours as needed.    Dispense:  30 tablet    Refill:  0  . torsemide (DEMADEX) 20 MG tablet    Sig: Take 4 tablets (80 mg total) by mouth 2 (two) times daily.    Dispense:  90 tablet    Refill:  0   Other Orders Orders Placed This Encounter  Procedures  . Glucose, capillary  . HCV RNA quant  . BMP w Anion Gap  (STAT/Sunquest-performed on-site)   Follow Up: Return in about 2 weeks (around 06/21/2015).

## 2015-06-07 NOTE — Assessment & Plan Note (Signed)
History: He has chronic lower back pain and underwent lumbar fusion in 2013. He is still in significant pain.  Assessment: He has Tramadol prescribed by his surgeon and I'll add acetaminopehn dosed for presumptive cirrhosis to help him out. W  Plan: We started acetaminophen 1000mg  three times daily as needed for back pain.

## 2015-06-07 NOTE — Assessment & Plan Note (Signed)
History: He had a gastric ulcer bleed in 2014. He is not currently on a PPI. He denies any melena or hematochezia.  Assessment: Because he has a history of gastric ulcer and we're starting both aspirin and rivaroxaban, I'll start a PPI.  Plan: We've started pantoprazole 40mg  daily.

## 2015-06-08 LAB — HCV RNA QUANT: Hepatitis C Quantitation: NOT DETECTED IU/mL

## 2015-06-10 ENCOUNTER — Encounter: Payer: Self-pay | Admitting: Internal Medicine

## 2015-06-10 NOTE — Progress Notes (Signed)
Internal Medicine Clinic Attending  Case discussed with Dr. Flores at the time of the visit.  We reviewed the resident's history and exam and pertinent patient test results.  I agree with the assessment, diagnosis, and plan of care documented in the resident's note. 

## 2015-06-11 NOTE — Progress Notes (Signed)
Patient ID: [name] [name], male   DOB: 14-Mar-1955, 60 y.o.   MRN: [id number]    Advanced Heart Failure Clinic Note   Primary Care: Internal Medicine clinic, Dr. Silverio Decamp Primary Cardiologist: Dr. Doylene Canard  GI: Dr Ardis Hughs.   HPI: [name] [name] is a 7 y.o. 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 Had history of acute renal failure in setting of rhabdo requiring short term HD.    Admitted with new onset HF in April 2014.   He was diuresed and discharged home.  Discharge weight 256 pounds. Echo at the time showed EF 45-80%, grade 1 diastolic dysfunction.    Admitted to [facility] 5/30 through 06/27/12 with GI bleed. EGD 06/25/12 multiple ulcers noted. Continued on protonix.   ECHO 10/27/12 EF 40-45%  ECHO 06/04/15 LVEF 20-25%, decreased diastolic compliance, LAE, RV severely reduced.   He presents today for HF follow up.  Last seen in HF clinic 10/03/2013. No recent admission. Was seen last week by PCP and noted to be in CHF with 3+ pedal edema. Recent echo decreased as above.   Patient recently seen in the ED for alcohol withdrawal and hallucinations. He was also recently started on Xarelto for newly diagnosed atrial fibrillation. Has not drank since 15-Oct-2013. Overall feeling ok. Denies SOB, CP, PND or orthopnea. Sometimes sleeps in recliner d/t back pain. Able to walk about 2 blocks before getting SOB. Able to go upstairs. Weight at home 280 lbs. Trying to follow low salt diet and drinking less than 2L a day. Complaint with medications and brought to visit. No bleeding issues.     Labs 02/12/13 K 4.3 Creatinine 0.98          08/2013: K 3.9, creatinine 1.27, Mag 1.8, pro-BNP 392, Troponin 0.05          06/07/15: K 4.2, creatinine 1.21  SH: Quit smoking 9 years ago. Lives alone. Quit ETOH  2013-10-15 FH: Mom died from MI in her early 67s  Brother MI and deceased. Does not know about fathers health history.    Review of Systems: All pertinent positives and negatives as in HPI, otherwise negative.    Past Medical History  Diagnosis Date  . GERD (gastroesophageal reflux disease)   . Schizophrenia, schizo-affective ([place])   . Cataract   . Hyperlipidemia   . Hypertension     uncontrolled with medication noncompliance  . HIT (heparin-induced thrombocytopenia) ([place])   . Continuous chronic alcoholism ([place])   . H/O cocaine abuse   . Stroke ([place]) 01/2012    Small cerebellar infarcts right greater than left as well as questionable acute left external capsule and caudate nuclear punctate lacunar infarcts noted per MRI (01/2012) - presumed to be embolic likely source PFO with right to left shunt (noted per TEE 01/ 2014)  . Chronic combined systolic and diastolic CHF (congestive heart failure) (HCC)     a) EF 40-45% per 2D echo (02/2012) with grade 1 DD b)  NICM c) RHC (04/2012): RA: 4, RV 45/3/4, PA 42/9 (24), PCWP 14, Fick CO/CI: 5.2 /2.2, PVR 1.9 WU, PA 62% and 64% d) ECHO (10/2012) EF 40-45%, grade II DD, RV nl  . Degenerative lumbar spinal stenosis     s/p L2-3, L3-4, L4-5 laminectomy partial facetectomy, and bilateral foraminotomy  . History of pancreatitis 01/2011  Admission for acute pancreatitis presumed 2/2 ongoing alcohol abuse- and hypertriglyceridemia  . PFO (patent foramen ovale) 01/2012    with right to left shunt, noted per TEE in evaluation for source of embolic stroke in 06/2701  . CKD (chronic kidney disease) stage 2, GFR 60-89 ml/min     BL SCr approximately 1-1.3  . 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 Hepatic steatosis     suspected 2/2 alcohol abuse  . Splenic cyst   . Colitis 05/2009    History of colitis of ascending colon noted on CT abd/pelvis (05/2009), with interval resolution with  subsequent CT  . Hypertriglyceridemia   . NICM (nonischemic cardiomyopathy) ([place])     a. LHC (04/2012): nl arteries  . Atrial fibrillation ([place])   . Cancer [facility])     Prostate cancer-bx. 3 weeks ago  . Hepatitis C   . Family history of adverse reaction to anesthesia     "sister, can't go to sleep"  . Diabetes mellitus without complication ([place])     Type II  . Shortness of breath dyspnea   . Dysrhythmia     A. Fib  . History of pneumonia     Current Outpatient Prescriptions  Medication Sig Dispense Refill  . acetaminophen (TYLENOL) 500 MG tablet Take 2 tablets (1,000 mg total) by mouth every 8 (eight) hours as needed. 30 tablet 0  . aspirin EC 81 MG tablet Take 1 tablet (81 mg total) by mouth daily. 90 tablet 3  . Blood Glucose Monitoring Suppl (ACCU-CHEK NANO SMARTVIEW) W/DEVICE KIT Use as instructed to test four times a day. DX: 250.92, insulin requiring 1 kit 0  . carvedilol (COREG) 25 MG tablet Take 1 tablet (25 mg total) by mouth 2 (two) times daily with a meal. 60 tablet 6  . cloNIDine (CATAPRES) 0.1 MG tablet Take 0.1 mg by mouth 2 (two) times daily.    Marland Kitchen lisinopril (PRINIVIL,ZESTRIL) 40 MG tablet Take 1 tablet (40 mg total) by mouth daily. 90 tablet 1  . pantoprazole (PROTONIX) 40 MG tablet Take 1 tablet (40 mg total) by mouth daily. 30 tablet 1  . Potassium Chloride ER 20 MEQ TBCR Take 20 mEq by mouth 2 (two) times daily. 60 tablet 0  . QUEtiapine (SEROQUEL) 300 MG tablet Take 600 mg by mouth at bedtime.  0  . rivaroxaban (XARELTO) 20 MG TABS tablet Take 1 tablet (20 mg total) by mouth daily. 90 tablet 3  . rosuvastatin (CRESTOR) 20 MG tablet Take 1 tablet (20 mg total) by mouth daily. (Patient taking differently: Take 20 mg by mouth at bedtime. ) 90 tablet 3  . spironolactone (ALDACTONE) 25 MG tablet Take 1 tablet (25 mg total) by mouth daily. 90 tablet 3  . torsemide (DEMADEX) 20 MG tablet Take 4 tablets (80 mg total) by mouth 2 (two) times daily. 90 tablet 0  . doxepin  (SINEQUAN) 100 MG capsule Take 100 mg by mouth at bedtime. Reported on 06/12/2015  0   No current facility-administered medications for this encounter.    Allergies  Allergen Reactions  . Heparin Other (See Comments)    Documented HIT under problem list Pt states he's not allergic. " They told me not take it anymore"  . Thorazine [Chlorpromazine Hcl] Other (See Comments)    Body freezes up     Filed Vitals:   06/12/15 1115  BP: 101/70  Pulse: 73  Weight: 282 lb 6.4 oz (  128.096 kg)  SpO2: 98%   Wt Readings from Last 3 Encounters:  06/12/15 282 lb 6.4 oz (128.096 kg)  06/07/15 274 lb 4.8 oz (124.422 kg)  05/30/15 277 lb 4.8 oz (125.782 kg)     PHYSICAL EXAM: General:  NAD. No respiratory difficulty   HEENT: normal Neck: supple. JVP difficult to see due to body habitus but does not appear elelvated. Carotids 2+ bilat; no bruits. No lymphadenopathy or thryomegaly appreciated. Cor: PMI nondisplaced. Regular rate & rhythm. No rubs, gallops, Very soft TR murmur. Lungs: clear Abdomen: Obese, soft, NT, ND, no HSM. No bruits or masses. +BS   Extremities: no cyanosis, clubbing, rash, bilateral trace edema Neuro: alert & oriented x 3, cranial nerves grossly intact. moves all 4 extremities w/o difficulty. Flat Affect  ASSESSMENT /PLAN  1) Acute on chronic combined systolic/diastolic HF: NICM, EF 16-10%, grade II DD (10/2012) now decreased to 20-25% with decreased diastolic compliance, LAE, and RV severely reduced.  - He has Myoview scheduled at end of next week for ischemic work up with newly reduced EF.  Think this could possibly be from longer term HTN vs Afib vs OHS/OSA.  - NYHA III symptoms and volume status elevated, up 8 lbs from PCP visit last week. - Continue torsemide 80 gm BID.  - Resume metolazone 2.5 mg with 40 meq of K.  Take one dose tomorrow and Friday, can repeat once next week if needed.  Will follow up BMET next week at follow up.  - Coreg at goal dose 25 mg BID and  lisinopril at goal dose 40 mg daily.  - Continue spironolactone 25 mg daily. - Continue potassium to 20 meq BID.   - Patient cancelled sleep study in the past and reports he thinks he snores quite a bit. Will refer back to have sleep study. - Reinforced the need and importance of daily weights, a low sodium diet, and fluid restriction (less than 2 L a day). Instructed to call the HF clinic if weight increases more than 3 lbs overnight or 5 lbs in a week.  2) HTN: - Stable on current meds as above.   3) PAF: - Sinus rhythm by exam and recent EKG.  4) ETOH abuse -  Continues to remain abstinent from alcohol.  5) CKD stage II - Watch closely with increased diuretics.  6) Bed bugs - Contact precautions were observed with PPE as well as informing hospital staff for proper decontamination of room.  7) ?OSA - with heavy snoring and no sleep study. Would benefit from sleep study once fluid better controlled.   Adding back metolazone as above with close follow up next week. Agree with upcoming ischemic work up, if any concerns from ischemia should proceed to cath, would benefit from [place] as well.  Had suspicion of OSA in the past, Needs sleep study once fluid controlled.   Shirley Friar PA-C 11:34 AM

## 2015-06-12 ENCOUNTER — Ambulatory Visit (HOSPITAL_COMMUNITY)
Admission: RE | Admit: 2015-06-12 | Discharge: 2015-06-12 | Disposition: A | Discharge status: Home / Self Care | Payer: Medicare Other | Source: Ambulatory Visit | Attending: Internal Medicine | Admitting: Internal Medicine

## 2015-06-12 VITALS — BP 101/70 | HR 73 | Wt 282.4 lb

## 2015-06-12 DIAGNOSIS — M4800 Spinal stenosis, site unspecified: Secondary | ICD-10-CM | POA: Diagnosis not present

## 2015-06-12 DIAGNOSIS — E781 Pure hyperglyceridemia: Secondary | ICD-10-CM | POA: Diagnosis not present

## 2015-06-12 DIAGNOSIS — Z7982 Long term (current) use of aspirin: Secondary | ICD-10-CM | POA: Insufficient documentation

## 2015-06-12 DIAGNOSIS — I5022 Chronic systolic (congestive) heart failure: Secondary | ICD-10-CM | POA: Diagnosis not present

## 2015-06-12 DIAGNOSIS — C61 Malignant neoplasm of prostate: Secondary | ICD-10-CM | POA: Insufficient documentation

## 2015-06-12 DIAGNOSIS — R0683 Snoring: Secondary | ICD-10-CM | POA: Insufficient documentation

## 2015-06-12 DIAGNOSIS — Z7901 Long term (current) use of anticoagulants: Secondary | ICD-10-CM | POA: Diagnosis not present

## 2015-06-12 DIAGNOSIS — Z87891 Personal history of nicotine dependence: Secondary | ICD-10-CM | POA: Insufficient documentation

## 2015-06-12 DIAGNOSIS — N182 Chronic kidney disease, stage 2 (mild): Secondary | ICD-10-CM | POA: Insufficient documentation

## 2015-06-12 DIAGNOSIS — I5043 Acute on chronic combined systolic (congestive) and diastolic (congestive) heart failure: Secondary | ICD-10-CM | POA: Insufficient documentation

## 2015-06-12 DIAGNOSIS — F1020 Alcohol dependence, uncomplicated: Secondary | ICD-10-CM | POA: Diagnosis not present

## 2015-06-12 DIAGNOSIS — I130 Hypertensive heart and chronic kidney disease with heart failure and stage 1 through stage 4 chronic kidney disease, or unspecified chronic kidney disease: Secondary | ICD-10-CM | POA: Insufficient documentation

## 2015-06-12 DIAGNOSIS — Z8249 Family history of ischemic heart disease and other diseases of the circulatory system: Secondary | ICD-10-CM | POA: Insufficient documentation

## 2015-06-12 DIAGNOSIS — B1920 Unspecified viral hepatitis C without hepatic coma: Secondary | ICD-10-CM | POA: Insufficient documentation

## 2015-06-12 DIAGNOSIS — I5020 Unspecified systolic (congestive) heart failure: Secondary | ICD-10-CM

## 2015-06-12 DIAGNOSIS — I10 Essential (primary) hypertension: Secondary | ICD-10-CM | POA: Diagnosis not present

## 2015-06-12 DIAGNOSIS — F209 Schizophrenia, unspecified: Secondary | ICD-10-CM | POA: Diagnosis not present

## 2015-06-12 DIAGNOSIS — I509 Heart failure, unspecified: Secondary | ICD-10-CM

## 2015-06-12 DIAGNOSIS — Z8673 Personal history of transient ischemic attack (TIA), and cerebral infarction without residual deficits: Secondary | ICD-10-CM | POA: Insufficient documentation

## 2015-06-12 DIAGNOSIS — I480 Paroxysmal atrial fibrillation: Secondary | ICD-10-CM | POA: Diagnosis not present

## 2015-06-12 DIAGNOSIS — Z888 Allergy status to other drugs, medicaments and biological substances status: Secondary | ICD-10-CM | POA: Diagnosis not present

## 2015-06-12 DIAGNOSIS — I428 Other cardiomyopathies: Secondary | ICD-10-CM | POA: Diagnosis not present

## 2015-06-12 DIAGNOSIS — K219 Gastro-esophageal reflux disease without esophagitis: Secondary | ICD-10-CM | POA: Insufficient documentation

## 2015-06-12 DIAGNOSIS — Z79899 Other long term (current) drug therapy: Secondary | ICD-10-CM | POA: Insufficient documentation

## 2015-06-12 DIAGNOSIS — B888 Other specified infestations: Secondary | ICD-10-CM

## 2015-06-12 DIAGNOSIS — E1122 Type 2 diabetes mellitus with diabetic chronic kidney disease: Secondary | ICD-10-CM | POA: Insufficient documentation

## 2015-06-12 DIAGNOSIS — R32 Unspecified urinary incontinence: Secondary | ICD-10-CM | POA: Diagnosis not present

## 2015-06-12 MED ORDER — METOLAZONE 2.5 MG PO TABS: 2.5000 mg | ORAL_TABLET | ORAL | Status: DC

## 2015-06-12 NOTE — Patient Instructions (Signed)
TAKE A METOLAZONE TODAY AND TOMORROW WITH AN ADDITIONAL 40 MEQ OF POTASSIUM  MAY REPEAT AGAIN NEXT WEEK IF WEIGHT DOES NOT GO DOWN  Your physician recommends that you schedule a follow-up appointment in: [place] AMY CLEGG, NP

## 2015-06-17 ENCOUNTER — Other Ambulatory Visit (HOSPITAL_COMMUNITY): Payer: Self-pay | Admitting: Student

## 2015-06-17 ENCOUNTER — Other Ambulatory Visit: Payer: Self-pay | Admitting: Internal Medicine

## 2015-06-17 ENCOUNTER — Other Ambulatory Visit: Payer: Self-pay

## 2015-06-17 ENCOUNTER — Telehealth (HOSPITAL_COMMUNITY): Payer: Self-pay | Admitting: Vascular Surgery

## 2015-06-17 ENCOUNTER — Other Ambulatory Visit: Payer: Self-pay | Admitting: *Deleted

## 2015-06-17 ENCOUNTER — Other Ambulatory Visit: Payer: Self-pay | Admitting: Cardiovascular Disease

## 2015-06-17 ENCOUNTER — Telehealth: Payer: Self-pay | Admitting: *Deleted

## 2015-06-17 DIAGNOSIS — R0600 Dyspnea, unspecified: Secondary | ICD-10-CM

## 2015-06-17 NOTE — Telephone Encounter (Signed)
Received call from Cardiology Office-stating pt unable to have myocardial perfusion done in their office 2/2 bedbugs.  Pt was rescheduled to have procedure done at [facility].Despina Hidden Cassady5/22/20174:53 PM

## 2015-06-17 NOTE — Telephone Encounter (Signed)
Returned pt call to confirm appt 06/20/15 @ 3:00

## 2015-06-17 NOTE — Addendum Note (Signed)
Addended by: Marcelino Duster on: 06/17/2015 04:57 PM   Modules accepted: Orders

## 2015-06-20 ENCOUNTER — Encounter (HOSPITAL_COMMUNITY): Admission: RE | Admit: 2015-06-20 | Payer: Medicare Other | Source: Ambulatory Visit

## 2015-06-20 ENCOUNTER — Encounter (HOSPITAL_COMMUNITY): Payer: Self-pay

## 2015-06-20 ENCOUNTER — Encounter (HOSPITAL_COMMUNITY): Payer: Medicare Other

## 2015-06-21 ENCOUNTER — Ambulatory Visit: Payer: Self-pay | Admitting: Internal Medicine

## 2015-06-21 ENCOUNTER — Encounter (HOSPITAL_COMMUNITY): Payer: Self-pay

## 2015-06-21 ENCOUNTER — Telehealth: Payer: Self-pay | Admitting: Internal Medicine

## 2015-06-21 NOTE — Telephone Encounter (Signed)
PT. REMINDER CALL, LMTCB °

## 2015-06-25 ENCOUNTER — Encounter: Payer: Self-pay | Admitting: Internal Medicine

## 2015-06-25 ENCOUNTER — Encounter (HOSPITAL_COMMUNITY)
Admission: RE | Admit: 2015-06-25 | Discharge: 2015-06-25 | Disposition: A | Discharge status: Home / Self Care | Payer: Medicare Other | Source: Ambulatory Visit | Attending: Student in an Organized Health Care Education/Training Program | Admitting: Student in an Organized Health Care Education/Training Program

## 2015-06-25 ENCOUNTER — Ambulatory Visit: Payer: Self-pay | Admitting: Internal Medicine

## 2015-06-25 DIAGNOSIS — R0600 Dyspnea, unspecified: Secondary | ICD-10-CM | POA: Insufficient documentation

## 2015-06-25 DIAGNOSIS — I4891 Unspecified atrial fibrillation: Secondary | ICD-10-CM | POA: Diagnosis not present

## 2015-06-25 LAB — NM MYOCAR MULTI W/SPECT W/WALL MOTION / EF
CHL CUP MPHR: 159 {beats}/min
CSEPEW: 1 METS
CSEPHR: 44 %
Exercise duration (min): 5 min
Exercise duration (sec): 20 s
Peak HR: 70 {beats}/min
Rest HR: 63 {beats}/min

## 2015-06-25 MED ORDER — TECHNETIUM TC 99M TETROFOSMIN IV KIT
10.0000 | PACK | Freq: Once | INTRAVENOUS | Status: AC | PRN
  Administered 2015-06-25: 10 via INTRAVENOUS

## 2015-06-25 MED ORDER — REGADENOSON 0.4 MG/5ML IV SOLN
INTRAVENOUS | Status: AC
  Filled 2015-06-25: qty 5

## 2015-06-25 MED ORDER — TECHNETIUM TC 99M TETROFOSMIN IV KIT
30.0000 | PACK | Freq: Once | INTRAVENOUS | Status: AC | PRN
  Administered 2015-06-25: 30 via INTRAVENOUS

## 2015-06-25 MED ORDER — REGADENOSON 0.4 MG/5ML IV SOLN: 0.4000 mg | Freq: Once | INTRAVENOUS | Status: DC

## 2015-06-25 NOTE — Progress Notes (Signed)
Lexiscan MV performed. 1 day study, [place] to read.  Rosaria Ferries, Hershal Coria 06/25/2015 12:28 PM Beeper [phone]

## 2015-06-27 ENCOUNTER — Other Ambulatory Visit: Payer: Self-pay | Admitting: Internal Medicine

## 2015-06-27 ENCOUNTER — Telehealth: Payer: Self-pay | Admitting: *Deleted

## 2015-06-27 ENCOUNTER — Telehealth: Payer: Self-pay | Admitting: Internal Medicine

## 2015-06-27 DIAGNOSIS — I429 Cardiomyopathy, unspecified: Secondary | ICD-10-CM

## 2015-06-27 NOTE — Telephone Encounter (Signed)
-----   Message from Axel Filler, MD sent at 06/25/2015  4:46 PM EDT -----   ----- Message -----    From: Rad Results In Interface    Sent: 06/25/2015   2:50 PM      To: Axel Filler, MD

## 2015-06-27 NOTE — Telephone Encounter (Signed)
Results discussed with [name]. He needs to follow up with cardiology for abnormal stress test consistent with prior infarct. Will likely need cath. No active symptoms currently. Case d/w RN Regino Schultze who will make a follow up appointment with cardiology for patient. Patient expressed understanding of plan.

## 2015-06-27 NOTE — Telephone Encounter (Signed)
Call to Patient to see if he has been to see the  A Cardiologist.  Patient stated that he has had testing done on his hart but has not been given a follow up appointment for the results.  Call to Cardiology.  Appointment has been scheduled with Dr. Dorris Carnes on 07/10/2015 at 12 noon. At [address].  Patient was asked to arrive by 11:45 AM.  Patient was given the information about the appointment and agreed to go.  Sander Nephew, RN 06/27/2015 4:58 PM

## 2015-07-09 NOTE — Progress Notes (Signed)
Cardiology Office Note   Date:  07/10/2015   ID:  [name] [name], DOB [birth year], MRN [id number]  PCP:  Aldine Contes, MD  Cardiologist:   Dorris Carnes, MD   Pt presents for f/u of CHF      History of Present Illness: [name] [name] is a 60 y.o. male with a history of HTN, DM, hep C, embolic CVA, prostate CA , EtOH abuse, formor cocaine use, afib, systolic and diatic CHF  He was seen in CHF clinic on 5/17  LVEF on last echo was 20 to 25%  RVEF was severely depressed   Myoiew scheduled  He was seen in CHF clininc   Pt did not taken all of meds Breathing is not too good laying down  Wt is unchanged  Appetite good No CP   Tries to watch salt    Tries to drink 1.5 to 2 L per day    Outpatient Prescriptions Prior to Visit  Medication Sig Dispense Refill  . acetaminophen (TYLENOL) 500 MG tablet Take 2 tablets (1,000 mg total) by mouth every 8 (eight) hours as needed. 30 tablet 0  . aspirin EC 81 MG tablet Take 1 tablet (81 mg total) by mouth daily. 90 tablet 3  . Blood Glucose Monitoring Suppl (ACCU-CHEK NANO SMARTVIEW) W/DEVICE KIT Use as instructed to test four times a day. DX: 250.92, insulin requiring 1 kit 0  . carvedilol (COREG) 25 MG tablet Take 1 tablet (25 mg total) by mouth 2 (two) times daily with a meal. 60 tablet 6  . cloNIDine (CATAPRES) 0.1 MG tablet Take 0.1 mg by mouth 2 (two) times daily.    Marland Kitchen lisinopril (PRINIVIL,ZESTRIL) 40 MG tablet Take 1 tablet (40 mg total) by mouth daily. 90 tablet 1  . metolazone (ZAROXOLYN) 2.5 MG tablet Take 1 tablet (2.5 mg total) by mouth as directed. 5 tablet 0  . pantoprazole (PROTONIX) 40 MG tablet Take 1 tablet (40 mg total) by mouth daily. 30 tablet 1  . Potassium Chloride ER 20 MEQ TBCR Take 20 mEq by mouth 2 (two) times daily. 60 tablet 0  . QUEtiapine (SEROQUEL) 300 MG tablet Take 600 mg by mouth at bedtime.  0  . rivaroxaban (XARELTO) 20 MG TABS tablet Take 1 tablet (20 mg total) by mouth daily. 90 tablet 3  .  rosuvastatin (CRESTOR) 20 MG tablet Take 1 tablet (20 mg total) by mouth daily. (Patient taking differently: Take 20 mg by mouth at bedtime. ) 90 tablet 3  . spironolactone (ALDACTONE) 25 MG tablet Take 1 tablet (25 mg total) by mouth daily. 90 tablet 3  . torsemide (DEMADEX) 20 MG tablet Take 4 tablets (80 mg total) by mouth 2 (two) times daily. 90 tablet 0  . doxepin (SINEQUAN) 100 MG capsule Take 100 mg by mouth at bedtime. Reported on 07/10/2015  0   No facility-administered medications prior to visit.     Allergies:   Heparin and Thorazine   Past Medical History  Diagnosis Date  . GERD (gastroesophageal reflux disease)   . Schizophrenia, schizo-affective ([place])   . Cataract   . Hyperlipidemia   . Hypertension     uncontrolled with medication noncompliance  . HIT (heparin-induced thrombocytopenia) ([place])   . Continuous chronic alcoholism ([place])   . H/O cocaine abuse   . Stroke ([place]) 01/2012    Small cerebellar infarcts right greater than left as well as questionable acute left external capsule and caudate nuclear punctate lacunar infarcts noted per MRI (01/2012) -  presumed to be embolic likely source PFO with right to left shunt (noted per TEE 01/ 2014)  . Chronic combined systolic and diastolic CHF (congestive heart failure) (HCC)     a) EF 40-45% per 2D echo (02/2012) with grade 1 DD b)  NICM c) RHC (04/2012): RA: 4, RV 45/3/4, PA 42/9 (24), PCWP 14, Fick CO/CI: 5.2 /2.2, PVR 1.9 WU, PA 62% and 64% d) ECHO (10/2012) EF 40-45%, grade II DD, RV nl  . Degenerative lumbar spinal stenosis     s/p L2-3, L3-4, L4-5 laminectomy partial facetectomy, and bilateral foraminotomy  . History of pancreatitis 01/2011    Admission for acute pancreatitis presumed 2/2 ongoing alcohol abuse- and hypertriglyceridemia  . PFO (patent foramen ovale) 01/2012    with right to left shunt, noted per TEE in evaluation for source of embolic stroke in 07/1243  . CKD (chronic kidney disease) stage 2, GFR 60-89  ml/min     BL SCr approximately 1-1.3  . 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 Hepatic steatosis     suspected 2/2 alcohol abuse  . Splenic cyst   . Colitis 05/2009    History of colitis of ascending colon noted on CT abd/pelvis (05/2009), with interval resolution with subsequent CT  . Hypertriglyceridemia   . NICM (nonischemic cardiomyopathy) ([place])     a. LHC (04/2012): nl arteries  . Atrial fibrillation ([place])   . Cancer [facility])     Prostate cancer-bx. 3 weeks ago  . Hepatitis C   . Family history of adverse reaction to anesthesia     "sister, can't go to sleep"  . Diabetes mellitus without complication ([place])     Type II  . Shortness of breath dyspnea   . Dysrhythmia     A. Fib  . History of pneumonia     Past Surgical History  Procedure Laterality Date  . L2-3, l3-4, l4-5 laminectomy, partial facetectomy    . Left achilles Right 2007  . Tonsillectomy    . Knee arthroscopy    . I&d extremity  03/20/2011    Procedure: IRRIGATION AND DEBRIDEMENT EXTREMITY;  Surgeon: Kerin Salen, MD;  Location: [place];  Service: Orthopedics;  Laterality: Left;  I&D LEFT ACHILLIES TENDON  . Eye surgery      right eye  . Resection distal clavical  09/17/2011    Procedure: RESECTION DISTAL CLAVICAL;  Surgeon: Nita Sells, MD;  Location: [facility];  Service: Orthopedics;  Laterality: Right;  right shoulder arthroscopy with sad and open distal clavicle excision   . Esophagogastroduodenoscopy N/A 06/25/2012    Procedure: ESOPHAGOGASTRODUODENOSCOPY (EGD);  Surgeon: Milus Banister, MD;  Location: [facility];  Service: Endoscopy;  Laterality: N/A;  . Robot assisted laparoscopic radical prostatectomy N/A 12/16/2012    Procedure: ROBOTIC ASSISTED LAPAROSCOPIC PROSTATECTOMY ;  Surgeon: Ardis Hughs, MD;  Location: [facility];  Service: Urology;  Laterality: N/A;     Social History:  The patient  reports that  he quit smoking about 14 years ago. His smoking use included Cigarettes. He has a 15 pack-year smoking history. He has never used smokeless tobacco. He reports that he drinks about 1.8 - 2.4 oz of alcohol per week. He reports that he does not use illicit drugs.   Family History:  The patient's family history includes CAD in his sister; CAD (age of onset: 27) in his brother; CAD (age of onset: 60) in his mother.  ROS:  Please see the history of present illness. All other systems are reviewed and  Negative to the above problem except as noted.    PHYSICAL EXAM: VS:  BP 168/110 mmHg  Pulse 74  Ht _0  (1.803 m)  Wt 282 lb (127.914 kg)  BMI 39.35 kg/m2  GEN: Morbidly obese 60 yo in NAD  Examined in chair , in no acute distress HEENT: normal Neck:   Neck full  carotid bruits, or masses Cardiac: RRR; no murmurs, rubs, or gallops,1+ edema legs and hands   Respiratory:  clear to auscultation bilaterally, normal work of breathing GI: soft, nontender, nondistended, + BS  No hepatomegaly  MS: no deformity Moving all extremities   Skin: warm and dry, no rash Neuro:  Strength and sensation are intact Psych: euthymic mood, full affect   EKG:  EKG is not ordered today.   Lipid Panel    Component Value Date/Time   CHOL 151 03/07/2014 0621   TRIG 182* 03/07/2014 0621   HDL 51 03/07/2014 0621   CHOLHDL 3.0 03/07/2014 0621   VLDL 36 03/07/2014 0621   LDLCALC 64 03/07/2014 0621      Wt Readings from Last 3 Encounters:  07/10/15 282 lb (127.914 kg)  06/12/15 282 lb 6.4 oz (128.096 kg)  06/07/15 274 lb 4.8 oz (124.422 kg)      ASSESSMENT AND PLAN:  1  Chronic systoic CHF  Volume is up on exam  I would recomm checking BMET and BNP before changing diuretics Take of of meds  Watch salt Will need to contact CHF clinic re f/u  2 HTN  BP is very high today  He says that he has missed some of his meds   Not sure what  He needs to review and take    F/U  To be determined       Signed, Dorris Carnes, MD  07/10/2015 12:21 PM    [address], [place], [place]  [address] Phone: [phone]; Fax: [phone]

## 2015-07-10 ENCOUNTER — Encounter: Payer: Self-pay | Admitting: Internal Medicine

## 2015-07-10 ENCOUNTER — Ambulatory Visit (INDEPENDENT_AMBULATORY_CARE_PROVIDER_SITE_OTHER): Payer: Medicare Other | Admitting: Internal Medicine

## 2015-07-10 VITALS — BP 168/110 | HR 74 | Ht 71.0 in | Wt 282.0 lb

## 2015-07-10 DIAGNOSIS — I480 Paroxysmal atrial fibrillation: Secondary | ICD-10-CM | POA: Diagnosis not present

## 2015-07-10 DIAGNOSIS — I509 Heart failure, unspecified: Secondary | ICD-10-CM

## 2015-07-10 DIAGNOSIS — I5020 Unspecified systolic (congestive) heart failure: Secondary | ICD-10-CM

## 2015-07-10 LAB — CBC
HCT: 42.4 % (ref 38.5–50.0)
Hemoglobin: 14.3 g/dL (ref 13.2–17.1)
MCH: 29.4 pg (ref 27.0–33.0)
MCHC: 33.7 g/dL (ref 32.0–36.0)
MCV: 87.1 fL (ref 80.0–100.0)
MPV: 11.5 fL (ref 7.5–12.5)
PLATELETS: 162 10*3/uL (ref 140–400)
RBC: 4.87 MIL/uL (ref 4.20–5.80)
RDW: 15.2 % — ABNORMAL HIGH (ref 11.0–15.0)
WBC: 4.3 10*3/uL (ref 3.8–10.8)

## 2015-07-10 LAB — BASIC METABOLIC PANEL
BUN: 14 mg/dL (ref 7–25)
CALCIUM: 9.7 mg/dL (ref 8.6–10.3)
CO2: 25 mmol/L (ref 20–31)
Chloride: 105 mmol/L (ref 98–110)
Creat: 1.16 mg/dL (ref 0.70–1.25)
GLUCOSE: 93 mg/dL (ref 65–99)
Potassium: 3.9 mmol/L (ref 3.5–5.3)
SODIUM: 142 mmol/L (ref 135–146)

## 2015-07-10 LAB — BRAIN NATRIURETIC PEPTIDE: BRAIN NATRIURETIC PEPTIDE: 107.2 pg/mL — AB (ref <100)

## 2015-07-10 NOTE — Patient Instructions (Signed)
Your physician recommends that you continue on your current medications as directed. Please refer to the Current Medication list given to you today. Your physician recommends that you return for lab work in: [place].

## 2015-07-12 ENCOUNTER — Other Ambulatory Visit: Payer: Self-pay | Admitting: *Deleted

## 2015-07-12 DIAGNOSIS — N182 Chronic kidney disease, stage 2 (mild): Secondary | ICD-10-CM

## 2015-07-12 DIAGNOSIS — I509 Heart failure, unspecified: Secondary | ICD-10-CM

## 2015-07-12 MED ORDER — METOLAZONE 5 MG PO TABS: 5.0000 mg | ORAL_TABLET | ORAL | Status: DC

## 2015-07-17 ENCOUNTER — Other Ambulatory Visit: Payer: Self-pay

## 2015-07-25 ENCOUNTER — Telehealth: Payer: Self-pay | Admitting: Pharmacist

## 2015-07-25 NOTE — Telephone Encounter (Addendum)
[name] [name] is a 60 y.o. male who was contacted via telephone for monitoring of rivaroxaban (Xarelto) therapy.    ASSESSMENT Indication(s): atrial fibrillation Duration: indefinite  Labs:    Component Value Date/Time   AST 30 05/28/2015 1510   ALT 34 05/28/2015 1510   NA 142 07/10/2015 1255   NA 142 02/12/2015 1522   K 3.9 07/10/2015 1255   CL 105 07/10/2015 1255   CO2 25 07/10/2015 1255   GLUCOSE 93 07/10/2015 1255   GLUCOSE 91 02/12/2015 1522   HGBA1C 6.1* 05/28/2015 1510   HGBA1C 5.7 02/12/2015 1440   BUN 14 07/10/2015 1255   BUN 12 02/12/2015 1522   CREATININE 1.16 07/10/2015 1255   CREATININE 1.21 06/07/2015 1433   CALCIUM 9.7 07/10/2015 1255   GFRAA >60 06/07/2015 1433   GFRAA >89 08/16/2013 1448   WBC 4.3 07/10/2015 1255   HGB 14.3 07/10/2015 1255   HCT 42.4 07/10/2015 1255   PLT 162 07/10/2015 1255   rivaroxaban (Xarelto) Dose: 20 mg daily  Safety: Patient has not had recent bleeding/thromboembolic events. Patient reports no recent signs or symptoms of bleeding, no signs of symptoms of thromboembolism. Medication changes: no.  Adherence: Patient reports no known adherence challenges and does correctly recite the dose. [facility] pharmacy records show adherence rate of 14% for rivaroxaban. No fills on record at [facility] pharmacy. May need to consider warfarin in the future if ongoing nonadherence with rivaroxaban. Patient did recently fill the rivaroxaban for 90-day supply (May 2017) so will call patient again in 1-3 months.   Patient Instructions: Patient advised to continue rivaroxaban as prescribed and contact clinic or seek medical attention if signs/symptoms of bleeding or thromboembolism occur. Patient verbalized understanding by repeating back information.  Follow-up Patient needs PCP follow up appointment, note sent to front desk for scheduling  [place] Pharmacist  07/25/2015, 12:41 PM

## 2015-08-06 ENCOUNTER — Other Ambulatory Visit: Payer: Self-pay | Admitting: Internal Medicine

## 2015-08-16 DIAGNOSIS — I10 Essential (primary) hypertension: Secondary | ICD-10-CM | POA: Diagnosis not present

## 2015-08-16 DIAGNOSIS — I5022 Chronic systolic (congestive) heart failure: Secondary | ICD-10-CM | POA: Diagnosis not present

## 2015-08-16 DIAGNOSIS — R32 Unspecified urinary incontinence: Secondary | ICD-10-CM | POA: Diagnosis not present

## 2015-08-16 DIAGNOSIS — M4800 Spinal stenosis, site unspecified: Secondary | ICD-10-CM | POA: Diagnosis not present

## 2015-08-20 ENCOUNTER — Encounter (HOSPITAL_COMMUNITY): Payer: Self-pay

## 2015-08-27 DIAGNOSIS — Z8546 Personal history of malignant neoplasm of prostate: Secondary | ICD-10-CM | POA: Diagnosis not present

## 2015-08-28 ENCOUNTER — Ambulatory Visit (HOSPITAL_BASED_OUTPATIENT_CLINIC_OR_DEPARTMENT_OTHER)
Admission: RE | Admit: 2015-08-28 | Discharge: 2015-08-28 | Disposition: A | Discharge status: Home / Self Care | Payer: Medicare Other | Source: Ambulatory Visit | Attending: Cardiology | Admitting: Cardiology

## 2015-08-28 ENCOUNTER — Inpatient Hospital Stay (HOSPITAL_COMMUNITY)
Admission: AD | Admit: 2015-08-28 | Discharge: 2015-09-03 | DRG: 286 | Disposition: A | Discharge status: Home / Self Care | Payer: Medicare Other | Source: Ambulatory Visit | Attending: Internal Medicine | Admitting: Internal Medicine

## 2015-08-28 VITALS — BP 116/86 | HR 66 | Wt 291.0 lb

## 2015-08-28 DIAGNOSIS — I509 Heart failure, unspecified: Secondary | ICD-10-CM

## 2015-08-28 DIAGNOSIS — I130 Hypertensive heart and chronic kidney disease with heart failure and stage 1 through stage 4 chronic kidney disease, or unspecified chronic kidney disease: Secondary | ICD-10-CM | POA: Diagnosis not present

## 2015-08-28 DIAGNOSIS — R0600 Dyspnea, unspecified: Secondary | ICD-10-CM | POA: Diagnosis present

## 2015-08-28 DIAGNOSIS — I5043 Acute on chronic combined systolic (congestive) and diastolic (congestive) heart failure: Secondary | ICD-10-CM | POA: Diagnosis present

## 2015-08-28 DIAGNOSIS — Z7982 Long term (current) use of aspirin: Secondary | ICD-10-CM

## 2015-08-28 DIAGNOSIS — G4733 Obstructive sleep apnea (adult) (pediatric): Secondary | ICD-10-CM | POA: Diagnosis present

## 2015-08-28 DIAGNOSIS — Z888 Allergy status to other drugs, medicaments and biological substances status: Secondary | ICD-10-CM

## 2015-08-28 DIAGNOSIS — R0602 Shortness of breath: Secondary | ICD-10-CM | POA: Diagnosis not present

## 2015-08-28 DIAGNOSIS — I5023 Acute on chronic systolic (congestive) heart failure: Secondary | ICD-10-CM | POA: Diagnosis not present

## 2015-08-28 DIAGNOSIS — Z87891 Personal history of nicotine dependence: Secondary | ICD-10-CM

## 2015-08-28 DIAGNOSIS — Z8249 Family history of ischemic heart disease and other diseases of the circulatory system: Secondary | ICD-10-CM | POA: Diagnosis not present

## 2015-08-28 DIAGNOSIS — Z79899 Other long term (current) drug therapy: Secondary | ICD-10-CM

## 2015-08-28 DIAGNOSIS — N179 Acute kidney failure, unspecified: Secondary | ICD-10-CM | POA: Diagnosis not present

## 2015-08-28 DIAGNOSIS — Z8546 Personal history of malignant neoplasm of prostate: Secondary | ICD-10-CM | POA: Diagnosis not present

## 2015-08-28 DIAGNOSIS — I959 Hypotension, unspecified: Secondary | ICD-10-CM | POA: Diagnosis not present

## 2015-08-28 DIAGNOSIS — F1410 Cocaine abuse, uncomplicated: Secondary | ICD-10-CM | POA: Diagnosis present

## 2015-08-28 DIAGNOSIS — B1920 Unspecified viral hepatitis C without hepatic coma: Secondary | ICD-10-CM | POA: Diagnosis present

## 2015-08-28 DIAGNOSIS — K219 Gastro-esophageal reflux disease without esophagitis: Secondary | ICD-10-CM | POA: Diagnosis present

## 2015-08-28 DIAGNOSIS — I5020 Unspecified systolic (congestive) heart failure: Secondary | ICD-10-CM

## 2015-08-28 DIAGNOSIS — I440 Atrioventricular block, first degree: Secondary | ICD-10-CM | POA: Diagnosis not present

## 2015-08-28 DIAGNOSIS — E1122 Type 2 diabetes mellitus with diabetic chronic kidney disease: Secondary | ICD-10-CM | POA: Diagnosis present

## 2015-08-28 DIAGNOSIS — I158 Other secondary hypertension: Secondary | ICD-10-CM

## 2015-08-28 DIAGNOSIS — I480 Paroxysmal atrial fibrillation: Secondary | ICD-10-CM | POA: Diagnosis present

## 2015-08-28 DIAGNOSIS — I428 Other cardiomyopathies: Secondary | ICD-10-CM | POA: Diagnosis not present

## 2015-08-28 DIAGNOSIS — F1020 Alcohol dependence, uncomplicated: Secondary | ICD-10-CM | POA: Diagnosis present

## 2015-08-28 DIAGNOSIS — Z8673 Personal history of transient ischemic attack (TIA), and cerebral infarction without residual deficits: Secondary | ICD-10-CM

## 2015-08-28 DIAGNOSIS — N182 Chronic kidney disease, stage 2 (mild): Secondary | ICD-10-CM | POA: Diagnosis not present

## 2015-08-28 DIAGNOSIS — F209 Schizophrenia, unspecified: Secondary | ICD-10-CM | POA: Diagnosis present

## 2015-08-28 DIAGNOSIS — Z7901 Long term (current) use of anticoagulants: Secondary | ICD-10-CM | POA: Diagnosis not present

## 2015-08-28 LAB — PROTIME-INR
INR: 1.1
Prothrombin Time: 14.2 seconds (ref 11.4–15.2)

## 2015-08-28 LAB — GLUCOSE, CAPILLARY: GLUCOSE-CAPILLARY: 150 mg/dL — AB (ref 65–99)

## 2015-08-28 LAB — LIPID PANEL
CHOL/HDL RATIO: 6.1 ratio
Cholesterol: 184 mg/dL (ref 0–200)
HDL: 30 mg/dL — AB (ref >40)
LDL Cholesterol: 116 mg/dL — ABNORMAL HIGH (ref 0–99)
TRIGLYCERIDES: 190 mg/dL — AB (ref <150)
VLDL: 38 mg/dL (ref 0–40)

## 2015-08-28 LAB — COMPREHENSIVE METABOLIC PANEL
ALBUMIN: 3.7 g/dL (ref 3.5–5.0)
ALT: 27 U/L (ref 17–63)
AST: 31 U/L (ref 15–41)
Alkaline Phosphatase: 74 U/L (ref 38–126)
Anion gap: 7 (ref 5–15)
BUN: 13 mg/dL (ref 6–20)
CHLORIDE: 108 mmol/L (ref 101–111)
CO2: 22 mmol/L (ref 22–32)
CREATININE: 0.97 mg/dL (ref 0.61–1.24)
Calcium: 9.9 mg/dL (ref 8.9–10.3)
GFR calc Af Amer: >60 mL/min (ref >60)
GLUCOSE: 91 mg/dL (ref 65–99)
POTASSIUM: 4.7 mmol/L (ref 3.5–5.1)
SODIUM: 137 mmol/L (ref 135–145)
Total Bilirubin: 0.5 mg/dL (ref 0.3–1.2)
Total Protein: 6.9 g/dL (ref 6.5–8.1)

## 2015-08-28 LAB — CBC WITH DIFFERENTIAL/PLATELET
Basophils Absolute: 0 10*3/uL (ref 0.0–0.1)
Basophils Relative: 0 %
EOS ABS: 0.2 10*3/uL (ref 0.0–0.7)
EOS PCT: 4 %
HCT: 47.4 % (ref 39.0–52.0)
HEMOGLOBIN: 15.2 g/dL (ref 13.0–17.0)
LYMPHS ABS: 2 10*3/uL (ref 0.7–4.0)
LYMPHS PCT: 41 %
MCH: 29.3 pg (ref 26.0–34.0)
MCHC: 32.1 g/dL (ref 30.0–36.0)
MCV: 91.5 fL (ref 78.0–100.0)
MONOS PCT: 11 %
Monocytes Absolute: 0.5 10*3/uL (ref 0.1–1.0)
Neutro Abs: 2.1 10*3/uL (ref 1.7–7.7)
Neutrophils Relative %: 44 %
PLATELETS: 184 10*3/uL (ref 150–400)
RBC: 5.18 MIL/uL (ref 4.22–5.81)
RDW: 14.9 % (ref 11.5–15.5)
WBC: 4.9 10*3/uL (ref 4.0–10.5)

## 2015-08-28 LAB — TSH: TSH: 2.559 u[IU]/mL (ref 0.350–4.500)

## 2015-08-28 LAB — MAGNESIUM: Magnesium: 2.1 mg/dL (ref 1.7–2.4)

## 2015-08-28 LAB — BRAIN NATRIURETIC PEPTIDE: B NATRIURETIC PEPTIDE 5: 63.2 pg/mL (ref 0.0–100.0)

## 2015-08-28 MED ORDER — QUETIAPINE FUMARATE 400 MG PO TABS
800.0000 mg | ORAL_TABLET | Freq: Every day | ORAL | Status: DC
  Administered 2015-08-28 – 2015-09-02 (×6): 800 mg via ORAL
  Filled 2015-08-28 (×6): qty 2

## 2015-08-28 MED ORDER — FUROSEMIDE 10 MG/ML IJ SOLN
80.0000 mg | Freq: Two times a day (BID) | INTRAMUSCULAR | Status: DC
  Administered 2015-08-28 – 2015-08-30 (×5): 80 mg via INTRAVENOUS
  Filled 2015-08-28 (×6): qty 8

## 2015-08-28 MED ORDER — RIVAROXABAN 20 MG PO TABS
20.0000 mg | ORAL_TABLET | Freq: Every day | ORAL | Status: DC
  Administered 2015-08-29 – 2015-09-02 (×5): 20 mg via ORAL
  Filled 2015-08-28 (×5): qty 1

## 2015-08-28 MED ORDER — ACETAMINOPHEN 325 MG PO TABS
650.0000 mg | ORAL_TABLET | ORAL | Status: DC | PRN
  Administered 2015-08-30 – 2015-09-02 (×3): 650 mg via ORAL
  Filled 2015-08-28 (×3): qty 2

## 2015-08-28 MED ORDER — PANTOPRAZOLE SODIUM 40 MG PO TBEC
40.0000 mg | DELAYED_RELEASE_TABLET | Freq: Every day | ORAL | Status: DC
  Administered 2015-08-29 – 2015-09-03 (×6): 40 mg via ORAL
  Filled 2015-08-28 (×6): qty 1

## 2015-08-28 MED ORDER — SODIUM CHLORIDE 0.9% FLUSH: 3.0000 mL | INTRAVENOUS | Status: DC | PRN

## 2015-08-28 MED ORDER — CARVEDILOL 12.5 MG PO TABS
12.5000 mg | ORAL_TABLET | Freq: Two times a day (BID) | ORAL | Status: DC
  Administered 2015-08-28 – 2015-08-29 (×3): 12.5 mg via ORAL
  Filled 2015-08-28 (×3): qty 1

## 2015-08-28 MED ORDER — SODIUM CHLORIDE 0.9% FLUSH
3.0000 mL | Freq: Two times a day (BID) | INTRAVENOUS | Status: DC
  Administered 2015-08-28 – 2015-09-03 (×11): 3 mL via INTRAVENOUS

## 2015-08-28 MED ORDER — ONDANSETRON HCL 4 MG/2ML IJ SOLN: 4.0000 mg | Freq: Four times a day (QID) | INTRAMUSCULAR | Status: DC | PRN

## 2015-08-28 MED ORDER — CLONIDINE HCL 0.1 MG PO TABS
0.1000 mg | ORAL_TABLET | Freq: Two times a day (BID) | ORAL | Status: DC
  Administered 2015-08-28 – 2015-08-29 (×3): 0.1 mg via ORAL
  Filled 2015-08-28 (×3): qty 1

## 2015-08-28 MED ORDER — ASPIRIN EC 81 MG PO TBEC
81.0000 mg | DELAYED_RELEASE_TABLET | Freq: Every day | ORAL | Status: DC
  Administered 2015-08-29 – 2015-09-03 (×5): 81 mg via ORAL
  Filled 2015-08-28 (×6): qty 1

## 2015-08-28 MED ORDER — SODIUM CHLORIDE 0.9 % IV SOLN: 250.0000 mL | INTRAVENOUS | Status: DC | PRN

## 2015-08-28 MED ORDER — SPIRONOLACTONE 25 MG PO TABS
25.0000 mg | ORAL_TABLET | Freq: Every day | ORAL | Status: DC
  Administered 2015-08-29 – 2015-09-03 (×6): 25 mg via ORAL
  Filled 2015-08-28 (×6): qty 1

## 2015-08-28 NOTE — Progress Notes (Signed)
Patient ID: [name] [name], male   DOB: [birth year], 60 y.o.   MRN: [id number]    Advanced Heart Failure Clinic Note   Primary Care: Internal Medicine clinic, Dr. Silverio Decamp Primary Cardiologist: Dr. Doylene Canard  GI: Dr Ardis Hughs.   HPI: [name] [name] is a 31 y.o. 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 Had history of acute renal failure in setting of rhabdo requiring short term HD.    Admitted with new onset HF in April 2014.   He was diuresed and discharged home.  Discharge weight 256 pounds. Echo at the time showed EF 95-62%, grade 1 diastolic dysfunction.    Ad[facility] to [facility] 5/30 through 06/27/12 with GI bleed. EGD 06/25/12 multiple ulcers noted. Continued on protonix.   He presents today for HF follow up. Complaining of dyspnea with exertion. Mild dyspnea at rest. +orthopnea. Denie PND. Taking all medications except metolazone. Misses medications 2-3 days a week. Drinking > 2 liters per day. Not weighing at home. Has an Aide. Increased leg edema.  Lives alone. Rarely drinks alcohol.   ECHO 10/27/12 EF 40-45%  ECHO 06/04/15 LVEF 20-25%, decreased diastolic compliance, LAE, RV severely reduced.   2014 LHC -  Left main: Normal LAD: Large vessel gives of 2 diagonals. Normal LCX: Very large vessel with 3 OMs. 20-30% plaque in midsection.  RCA: Dominant. Normal LV-gram done in the RAO projection: Ejection fraction = 35-40% global HK  Assessment: 1. Minimal CAD  Labs 02/12/13 K 4.3 Creatinine 0.98          08/2013: K 3.9, creatinine 1.27, Mag 1.8, pro-BNP 392, Troponin 0.05          06/07/15: K 4.2, creatinine 1.21          07/10/2015: K 3.9 Creatinine 1.16   SH: Quit smoking 9 years ago. Lives alone. Quit ETOH 10/12/13 FH: Mom died from MI in her early 46s  Brother MI and  deceased. Does not know about fathers health history.    Review of Systems: All pertinent positives and negatives as in HPI, otherwise negative.    Past Medical History:  Diagnosis Date  . Atrial fibrillation ([place])   . Cancer [facility])    Prostate cancer-bx. 3 weeks ago  . Cataract   . Chronic combined systolic and diastolic CHF (congestive heart failure) (HCC)    a) EF 40-45% per 2D echo (02/2012) with grade 1 DD b)  NICM c) RHC (04/2012): RA: 4, RV 45/3/4, PA 42/9 (24), PCWP 14, Fick CO/CI: 5.2 /2.2, PVR 1.9 WU, PA 62% and 64% d) ECHO (10/2012) EF 40-45%, grade II DD, RV nl  . CKD (chronic kidney disease) stage 2, GFR 60-89 ml/min    BL SCr approximately 1-1.3  . Colitis 05/2009   History of colitis of ascending colon noted on CT abd/pelvis (05/2009), with interval resolution with subsequent CT  . Continuous chronic alcoholism ([place])   . Degenerative lumbar spinal stenosis    s/p L2-3, L3-4, L4-5 laminectomy partial facetectomy, and bilateral foraminotomy  . Diabetes mellitus without complication ([place])    Type II  . Dysrhythmia    A. Fib  . Family history of adverse reaction to anesthesia    "sister, can't go to sleep"  . GERD (gastroesophageal reflux disease)   . H/O cocaine abuse   . Hepatic steatosis  suspected 2/2 alcohol abuse  . Hepatitis C   . History of pancreatitis 01/2011   Admission for acute pancreatitis presumed 2/2 ongoing alcohol abuse- and hypertriglyceridemia  . History of pneumonia   . HIT (heparin-induced thrombocytopenia) ([place])   . Hyperlipidemia   . Hypertension    uncontrolled with medication noncompliance  . Hypertriglyceridemia   . NICM (nonischemic cardiomyopathy) ([place])    a. LHC (04/2012): nl arteries  . PFO (patent foramen ovale) 01/2012   with right to left shunt, noted per TEE in evaluation for source of embolic stroke in 05/9739  . Rhabdomyolysis 02/22/2012   H/O rhabdomyolysis in 01/2012 that was idiopathic, cause never identified  .  Schizophrenia, schizo-affective ([place])   . Shortness of breath dyspnea   . Splenic cyst   . Stroke ([place]) 01/2012   Small cerebellar infarcts right greater than left as well as questionable acute left external capsule and caudate nuclear punctate lacunar infarcts noted per MRI (01/2012) - presumed to be embolic likely source PFO with right to left shunt (noted per TEE 01/ 2014)    Current Outpatient Prescriptions  Medication Sig Dispense Refill  . acetaminophen (TYLENOL) 500 MG tablet Take 2 tablets (1,000 mg total) by mouth every 8 (eight) hours as needed. 30 tablet 0  . aspirin EC 81 MG tablet Take 1 tablet (81 mg total) by mouth daily. 90 tablet 3  . Blood Glucose Monitoring Suppl (ACCU-CHEK NANO SMARTVIEW) W/DEVICE KIT Use as instructed to test four times a day. DX: 250.92, insulin requiring 1 kit 0  . carvedilol (COREG) 25 MG tablet Take 1 tablet (25 mg total) by mouth 2 (two) times daily with a meal. 60 tablet 6  . cloNIDine (CATAPRES) 0.1 MG tablet Take 0.1 mg by mouth 2 (two) times daily.    Marland Kitchen lisinopril (PRINIVIL,ZESTRIL) 20 MG tablet Take 20 mg by mouth 2 (two) times daily.    . pantoprazole (PROTONIX) 40 MG tablet take 1 tablet by mouth once daily 30 tablet 1  . Potassium Chloride ER 20 MEQ TBCR Take 20 mEq by mouth 2 (two) times daily. 60 tablet 0  . QUEtiapine (SEROQUEL) 300 MG tablet Take 600 mg by mouth at bedtime.  0  . rivaroxaban (XARELTO) 20 MG TABS tablet Take 1 tablet (20 mg total) by mouth daily. 90 tablet 3  . spironolactone (ALDACTONE) 25 MG tablet Take 1 tablet (25 mg total) by mouth daily. 90 tablet 3  . torsemide (DEMADEX) 20 MG tablet Take 4 tablets (80 mg total) by mouth 2 (two) times daily. 90 tablet 0  . metolazone (ZAROXOLYN) 5 MG tablet Take 1 tablet (5 mg total) by mouth as directed. (Patient not taking: Reported on 08/28/2015)    . rosuvastatin (CRESTOR) 20 MG tablet Take 1 tablet (20 mg total) by mouth daily. (Patient not taking: Reported on 08/28/2015) 90 tablet 3    No current facility-administered medications for this encounter.     Allergies  Allergen Reactions  . Heparin Other (See Comments)    Documented HIT under problem list Pt states he's not allergic. " They told me not take it anymore"  . Thorazine [Chlorpromazine Hcl] Other (See Comments)    Body freezes up     Vitals:   08/28/15 1409  BP: 116/86  Pulse: 66  SpO2: 97%  Weight: 291 lb (132 kg)   Wt Readings from Last 3 Encounters:  08/28/15 291 lb (132 kg)  07/10/15 282 lb (127.9 kg)  06/12/15 282 lb 6.4 oz (128.1  kg)     PHYSICAL EXAM: General:  Mild dyspnea at rest.  Arrived in wheel chair.  HEENT: normal Neck: supple. JVP difficult to see due to body habitus btu appears elevated. Carotids 2+ bilat; no bruits. No lymphadenopathy or thryomegaly appreciated. Cor: PMI nondisplaced. Regular rate & rhythm. No rubs, gallops, Very soft TR murmur. Lungs: clear Abdomen: Obese, soft, NT, ND, no HSM. No bruits or masses. +BS   Extremities: no cyanosis, clubbing, rash, R and LLE 3+ edema  Neuro: alert & oriented x 3, cranial nerves grossly intact. moves all 4 extremities w/o difficulty. Flat Affect  EKG: SR 1st degree heart block  ASSESSMENT /PLAN 1) Acute on chronic combined systolic/diastolic HF: EF 44-01%, grade II DD (10/2012) now decreased to 20-25%  (05/2015) with decreased diastolic compliance, LAE, and RV severely reduced. Had [place] 2014 with minimal CAD.  - He has had Myoview scheduled but has not shown up for appointment x 2  - NYHA III-IV. Volume status elevated. >15 pounds.  -Cut coreg in 12.5 mg twice a day.  -Stop lisinopril.   - Continue spironolactone 25 mg daily. - 2) HTN:  3) PAF: EKG today. In NSR  4) ETOH abuse 5) CKD stage II - Watch closely with increased diuretics.  - 6) ?OSA - with heavy snoring and no sleep study.  6) DMII- Start sliding scale.   Admit to telemetry for IV diuresis. When discharged he will need HH referral.  Labrea Eccleston NP-C  2:20  PM

## 2015-08-28 NOTE — Addendum Note (Signed)
Encounter addended by: Effie Berkshire, RN on: 08/28/2015  3:18 PM<BR>    Actions taken: Order Entry activity accessed

## 2015-08-28 NOTE — Addendum Note (Signed)
Encounter addended by: Effie Berkshire, RN on: 08/28/2015  3:42 PM<BR>    Actions taken: Order Entry activity accessed, Sign clinical note

## 2015-08-28 NOTE — H&P (Signed)
H&P  Primary Care: Internal Medicine clinic, Dr. Silverio Decamp Primary Cardiologist: Dr. Doylene Canard  GI: Dr Ardis Hughs.   HPI: Mr Grissett is a 60 y.o. 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 Had history of acute renal failure in setting of rhabdo requiring short term HD.    Admitted with new onset HF in April 2014.   He was diuresed and discharged home.  Discharge weight 256 pounds. Echo at the time showed EF 16-10%, grade 1 diastolic dysfunction.    Admitted to [facility] 5/30 through 06/27/12 with GI bleed. EGD 06/25/12 multiple ulcers noted. Continued on protonix.   He presents today for HF follow up. Complaining of dyspnea with exertion. Mild dyspnea at rest. +orthopnea. Denie PND. Taking all medications except metolazone. Misses medications 2-3 days a week. Drinking > 2 liters per day. Not weighing at home. Has an Aide. Increased leg edema.  Lives alone. Rarely drinks alcohol.   ECHO 10/27/12 EF 40-45%  ECHO 06/04/15 LVEF 20-25%, decreased diastolic compliance, LAE, RV severely reduced.   2014 LHC -  Left main: Normal LAD: Large vessel gives of 2 diagonals. Normal LCX: Very large vessel with 3 OMs. 20-30% plaque in midsection.  RCA: Dominant. Normal LV-gram done in the RAO projection: Ejection fraction = 35-40% global HK Assessment: 1. Minimal CAD  Labs 02/12/13 K 4.3 Creatinine 0.98          08/2013: K 3.9, creatinine 1.27, Mag 1.8, pro-BNP 392, Troponin 0.05          06/07/15: K 4.2, creatinine 1.21          07/10/2015: K 3.9 Creatinine 1.16   SH: Quit smoking 9 years ago. Lives alone. Drinks every now and then.  FH: Mom died from MI in her early 63s  Brother MI and deceased. Does not know about fathers health history.     Review of Systems: All pertinent  positives and negatives as in HPI, otherwise negative.        Past Medical History:  Diagnosis Date  . Atrial fibrillation ([place])   . Cancer [facility])    Prostate cancer-bx. 3 weeks ago  . Cataract   . Chronic combined systolic and diastolic CHF (congestive heart failure) (HCC)    a) EF 40-45% per 2D echo (02/2012) with grade 1 DD b)  NICM c) RHC (04/2012): RA: 4, RV 45/3/4, PA 42/9 (24), PCWP 14, Fick CO/CI: 5.2 /2.2, PVR 1.9 WU, PA 62% and 64% d) ECHO (10/2012) EF 40-45%, grade II DD, RV nl  . CKD (chronic kidney disease) stage 2, GFR 60-89 ml/min    BL SCr approximately 1-1.3  . Colitis 05/2009   History of colitis of ascending colon noted on CT abd/pelvis (05/2009), with interval resolution with subsequent CT  . Continuous chronic alcoholism ([place])   . Degenerative lumbar spinal stenosis    s/p L2-3, L3-4, L4-5 laminectomy partial facetectomy, and bilateral foraminotomy  . Diabetes mellitus without complication ([place])    Type II  . Dysrhythmia    A. Fib  . Family history of adverse reaction to anesthesia    "sister, can't go to sleep"  . GERD (gastroesophageal reflux disease)   . H/O cocaine abuse   . Hepatic steatosis    suspected 2/2 alcohol abuse  . Hepatitis C   . History of pancreatitis 01/2011  Admission for acute pancreatitis presumed 2/2 ongoing alcohol abuse- and hypertriglyceridemia  . History of pneumonia   . HIT (heparin-induced thrombocytopenia) ([place])   . Hyperlipidemia   . Hypertension    uncontrolled with medication noncompliance  . Hypertriglyceridemia   . NICM (nonischemic cardiomyopathy) ([place])    a. LHC (04/2012): nl arteries  . PFO (patent foramen ovale) 01/2012   with right to left shunt, noted per TEE in evaluation for source of embolic stroke in 04/84  . Rhabdomyolysis 02/22/2012   H/O rhabdomyolysis in 01/2012 that was idiopathic, cause never identified  . Schizophrenia, schizo-affective ([place])   . Shortness of breath  dyspnea   . Splenic cyst   . Stroke ([place]) 01/2012   Small cerebellar infarcts right greater than left as well as questionable acute left external capsule and caudate nuclear punctate lacunar infarcts noted per MRI (01/2012) - presumed to be embolic likely source PFO with right to left shunt (noted per TEE 01/ 2014)          Current Outpatient Prescriptions  Medication Sig Dispense Refill  . acetaminophen (TYLENOL) 500 MG tablet Take 2 tablets (1,000 mg total) by mouth every 8 (eight) hours as needed. 30 tablet 0  . aspirin EC 81 MG tablet Take 1 tablet (81 mg total) by mouth daily. 90 tablet 3  . Blood Glucose Monitoring Suppl (ACCU-CHEK NANO SMARTVIEW) W/DEVICE KIT Use as instructed to test four times a day. DX: 250.92, insulin requiring 1 kit 0  . carvedilol (COREG) 25 MG tablet Take 1 tablet (25 mg total) by mouth 2 (two) times daily with a meal. 60 tablet 6  . cloNIDine (CATAPRES) 0.1 MG tablet Take 0.1 mg by mouth 2 (two) times daily.    Marland Kitchen lisinopril (PRINIVIL,ZESTRIL) 20 MG tablet Take 20 mg by mouth 2 (two) times daily.    . pantoprazole (PROTONIX) 40 MG tablet take 1 tablet by mouth once daily 30 tablet 1  . Potassium Chloride ER 20 MEQ TBCR Take 20 mEq by mouth 2 (two) times daily. 60 tablet 0  . QUEtiapine (SEROQUEL) 300 MG tablet Take 600 mg by mouth at bedtime.  0  . rivaroxaban (XARELTO) 20 MG TABS tablet Take 1 tablet (20 mg total) by mouth daily. 90 tablet 3  . spironolactone (ALDACTONE) 25 MG tablet Take 1 tablet (25 mg total) by mouth daily. 90 tablet 3  . torsemide (DEMADEX) 20 MG tablet Take 4 tablets (80 mg total) by mouth 2 (two) times daily. 90 tablet 0  . metolazone (ZAROXOLYN) 5 MG tablet Take 1 tablet (5 mg total) by mouth as directed. (Patient not taking: Reported on 08/28/2015)    . rosuvastatin (CRESTOR) 20 MG tablet Take 1 tablet (20 mg total) by mouth daily. (Patient not taking: Reported on 08/28/2015) 90 tablet 3   No current facility-administered  medications for this encounter.          Allergies  Allergen Reactions  . Heparin Other (See Comments)    Documented HIT under problem list Pt states he's not allergic. " They told me not take it anymore"  . Thorazine [Chlorpromazine Hcl] Other (See Comments)    Body freezes up        Vitals:   08/28/15 1409  BP: 116/86  Pulse: 66  SpO2: 97%  Weight: 291 lb (132 kg)      Wt Readings from Last 3 Encounters:  08/28/15 291 lb (132 kg)  07/10/15 282 lb (127.9 kg)  06/12/15 282 lb 6.4 oz (128.1  kg)     PHYSICAL EXAM: General:  Mild dyspnea at rest.  Arrived in wheel chair.  HEENT: normal Neck: supple. JVP difficult to see due to body habitus btu appears elevated. Carotids 2+ bilat; no bruits. No lymphadenopathy or thryomegaly appreciated. Cor: PMI nondisplaced. Regular rate & rhythm. No rubs, gallops, Very soft TR murmur. Lungs: clear Abdomen: Obese, soft, NT, ND, no HSM. No bruits or masses. +BS   Extremities: no cyanosis, clubbing, rash, R and LLE 3+ edema  Neuro: alert & oriented x 3, cranial nerves grossly intact. moves all 4 extremities w/o difficulty. Flat Affect  EKG: SR 1st degree heart block  ASSESSMENT /PLAN 1) Acute on chronic combined systolic/diastolic HF: EF 13-24%, grade II DD (10/2012) now decreased to 20-25%  (05/2015) with decreased diastolic compliance, LAE, and RV severely reduced. Had [place] 2014 with minimal CAD.  - He has had Myoview scheduled but has not shown up for appointment x 2  - NYHA III-IV. Volume status elevated. >15 pounds.  -Cut coreg in 12.5 mg twice a day.  -Stop lisinopril.   - Continue spironolactone 25 mg daily. - 2) HTN:   3) PAF:EKG today. In NSR  4) ETOH abuse 5) CKD stage II - Watch closely with increased diuretics.  - 6) ?OSA - with heavy snoring and no sleep study.  6) DMII- Start sliding scale.   Admit to telemetry for IV diuresis. When discharged he will need HH referral.  Amy Clegg NP-C  2:20  PM  Patient seen and examined with Darrick Grinder, NP. We discussed all aspects of the encounter. I agree with the assessment and plan as stated above.   He is 15 pounds volume overloaded in setting of missing several doses of diuretics and dietary non-compliance. Will admit for diuresis. Have discussed need for ICD in near future as well. Will need ongoing HF education.  Watch renal function closely.   Zenna Traister,MD 4:25 PM

## 2015-08-28 NOTE — Progress Notes (Signed)
Patient arrived from clinic, currently stable, no acute distress. Starting on IV lasix.   He has a draining wound on the medial side of R leg. Base of the wound appears to be healed, does not look infected, but it is draining likely result of LE edema from HF. Will get wound care involved as well for recommendation.  Hilbert Corrigan PA Pager: [phone]

## 2015-08-28 NOTE — Progress Notes (Signed)
PIV inserted x 1 attempt in RLW. Saline locked, capped off. Patient tolerated well.  Renee Pain  RN

## 2015-08-29 ENCOUNTER — Inpatient Hospital Stay (HOSPITAL_COMMUNITY): Payer: Medicare Other

## 2015-08-29 ENCOUNTER — Encounter (HOSPITAL_COMMUNITY): Payer: Self-pay | Admitting: General Practice

## 2015-08-29 LAB — BASIC METABOLIC PANEL
Anion gap: 7 (ref 5–15)
BUN: 14 mg/dL (ref 6–20)
CO2: 26 mmol/L (ref 22–32)
CREATININE: 1.04 mg/dL (ref 0.61–1.24)
Calcium: 9.8 mg/dL (ref 8.9–10.3)
Chloride: 104 mmol/L (ref 101–111)
GFR calc Af Amer: >60 mL/min (ref >60)
Glucose, Bld: 104 mg/dL — ABNORMAL HIGH (ref 65–99)
POTASSIUM: 4.3 mmol/L (ref 3.5–5.1)
SODIUM: 137 mmol/L (ref 135–145)

## 2015-08-29 LAB — HEMOGLOBIN A1C
HEMOGLOBIN A1C: 6.1 % — AB (ref 4.8–5.6)
MEAN PLASMA GLUCOSE: 128 mg/dL

## 2015-08-29 LAB — GLUCOSE, CAPILLARY: GLUCOSE-CAPILLARY: 105 mg/dL — AB (ref 65–99)

## 2015-08-29 MED ORDER — METOLAZONE 2.5 MG PO TABS
2.5000 mg | ORAL_TABLET | Freq: Once | ORAL | Status: AC
  Administered 2015-08-29: 2.5 mg via ORAL
  Filled 2015-08-29: qty 1

## 2015-08-29 MED ORDER — SACUBITRIL-VALSARTAN 24-26 MG PO TABS
1.0000 | ORAL_TABLET | Freq: Two times a day (BID) | ORAL | Status: DC
  Administered 2015-08-29 – 2015-08-30 (×3): 1 via ORAL
  Filled 2015-08-29 (×4): qty 1

## 2015-08-29 NOTE — Progress Notes (Signed)
RE: Benefit check for Alvy Beal CMA        S/W PETE @ [employer] # [phone]   SACUBITRIL-VALSARTAN ( ENTRESTO ) 24-26 MG PER TABLET BID   COVER -YES  CO-PAY-25 % OF COST  TIER- 3 DRUG  PRIOR APPROVAL- NO  PHARMACY : OPEN TO ANY RETAIL

## 2015-08-29 NOTE — Care Management Important Message (Signed)
Important Message  Patient Details  Name: [name] [name] MRN: [id number] Date of Birth: 07-25-1955   Medicare Important Message Given:  Yes    Loann Quill 08/29/2015, 10:51 AM

## 2015-08-29 NOTE — Progress Notes (Addendum)
[facility] Nurse wound consult note Reason for Consult: Draining wound to right medial lower extremity Wound type: Partial thickness wound  Pressure Ulcer POA: No Measurement:4 cm x 2 cm Wound bed:Dry, pale pink Drainage (amount, consistency, odor) No drainage  Periwound:Dark, dry and flaky pigmented skin Dressing procedure/placement/frequency:Cleanse right medial lower extremity wound with normal saline, pat dry. Cover wound bed  with Xeroform gauze (cut to fit wound) and secure with 5 x 5 Allevyn border foam dressing. Change dressing every other day.   Re consult if needed, will not follow at this time. Thanks, Melba Coon MSN, RN, [employer]

## 2015-08-29 NOTE — Care Management Note (Signed)
Case Management Note  Patient Details  Name: [name] [name] MRN: [id number] Date of Birth: 1955-10-22  Subjective/Objective:       Admitted with CHF             Action/Plan: Patient lives at home alone, his sister and niece assist him as needed. PCP is Dr Silverio Decamp with Internal Medicine; private insurance with [facility] with prescription drug coverage; patient states no problem getting his medication; pharmacy of choice is [facility]; He has a personal care service ( he does not remember the name of the Agency) that help him for 3 hrs 7 days a week; patient could also benefit from a Disease Management Program for CHF; [place] choice offered, pt chose [place]; Butch Penny with [facility] made aware; patient stated that he have a cane at home and scales; CM encouraged patient to weigh himself daily and write it down. Referral made to [facility] CHF program also. Expected Discharge Date:   possibly 09/02/2015               Expected Discharge Plan:  [place]    Discharge planning Services  CM Consult   Choice offered to:  Patient     HH Arranged:  RN, Disease Management, PT [place] Agency:  [facility]  Status of Service:  In process, will continue to follow  Sherrilyn Rist [phone] 08/29/2015, 3:29 PM

## 2015-08-29 NOTE — Progress Notes (Signed)
Advanced Heart Failure Rounding Note   Subjective:    Admitted from HF clinic with volume overload. Diuresing with IV lasix. Brisk diuresis noted.  SOB with exertion.   Objective:   Weight Range:  Vital Signs:   Temp:  [97.5 F (36.4 C)-98 F (36.7 C)] 97.5 F (36.4 C) (08/03 0505) Pulse Rate:  [55-67] 55 (08/03 0505) Resp:  [18] 18 (08/03 0505) BP: (106-129)/(64-88) 108/68 (08/03 0505) SpO2:  [97 %-100 %] 100 % (08/03 0505) Weight:  [282 lb 3.2 oz (128 kg)-286 lb 1.6 oz (129.8 kg)] 282 lb 3.2 oz (128 kg) (08/03 0505) Last BM Date: 08/27/15  Weight change: Filed Weights   08/28/15 1647 08/29/15 0505  Weight: 286 lb 1.6 oz (129.8 kg) 282 lb 3.2 oz (128 kg)    Intake/Output:   Intake/Output Summary (Last 24 hours) at 08/29/15 0722 Last data filed at 08/29/15 0718  Gross per 24 hour  Intake              605 ml  Output             3275 ml  Net            -2670 ml     Physical Exam: General:  Chroncially  ill. No resp difficulty. In chair HEENT: normal Neck: supple. JVP thick neck. Elevated to jaw.  Carotids 2+ bilat; no bruits. No lymphadenopathy or thryomegaly appreciated. Cor: PMI nondisplaced. Regular rate & rhythm. No rubs, gallops or murmurs. Lungs: clear with mildly decreased BS throughout Abdomen: soft, nontender, nondistended. No hepatosplenomegaly. No bruits or masses. Good bowel sounds. Extremities: no cyanosis, clubbing, rash, RLE and LLE 3+ edema. RLE wound Neuro: alert & orientedx3, cranial nerves grossly intact. moves all 4 extremities w/o difficulty. Affect pleasant  Telemetry: Sinus Brady 50s  Labs: Basic Metabolic Panel:  Recent Labs Lab 08/28/15 1506 08/29/15 0236  NA 137 137  K 4.7 4.3  CL 108 104  CO2 22 26  GLUCOSE 91 104*  BUN 13 14  CREATININE 0.97 1.04  CALCIUM 9.9 9.8  MG 2.1  --     Liver Function Tests:  Recent Labs Lab 08/28/15 1506  AST 31  ALT 27  ALKPHOS 74  BILITOT 0.5  PROT 6.9  ALBUMIN 3.7   No results  for input(s): LIPASE, AMYLASE in the last 168 hours. No results for input(s): AMMONIA in the last 168 hours.  CBC:  Recent Labs Lab 08/28/15 1506  WBC 4.9  NEUTROABS 2.1  HGB 15.2  HCT 47.4  MCV 91.5  PLT 184    Cardiac Enzymes: No results for input(s): CKTOTAL, CKMB, CKMBINDEX, TROPONINI in the last 168 hours.  BNP: BNP (last 3 results)  Recent Labs  02/12/15 1522 07/10/15 1255 08/28/15 1506  BNP 113.6* 107.2* 63.2    ProBNP (last 3 results) No results for input(s): PROBNP in the last 8760 hours.    Other results:  Imaging:  No results found.   Medications:     Scheduled Medications: . aspirin EC  81 mg Oral Daily  . carvedilol  12.5 mg Oral BID WC  . cloNIDine  0.1 mg Oral BID  . furosemide  80 mg Intravenous BID  . pantoprazole  40 mg Oral Daily  . QUEtiapine  800 mg Oral QHS  . rivaroxaban  20 mg Oral Q supper  . sodium chloride flush  3 mL Intravenous Q12H  . spironolactone  25 mg Oral Daily     Infusions:  PRN Medications:  sodium chloride, acetaminophen, ondansetron (ZOFRAN) IV, sodium chloride flush   Assessment/Plan    1) Acute on chronic combined systolic/diastolic HF: EF A999333, grade II DD (10/2012) now decreased to 20-25% (05/2015) with decreased diastolic compliance, LAE, and RV severely reduced. Had [place] 2014 with minimal CAD.  - He has had Myoview scheduled this year but has not shown up for appointment x 2  Brisk diuresis noted with IV lasix. Volume status remains elevated. Continue lasix 80 mg twice a day.  -Continue coreg in 12.5 mg twice a day. This was cut back on admit with acute decompensation. Also had 1st degree heart block.  - Last dose of lisinopril was 8/2 in the morning. Start entresto 24-26 mg with first dose tonight.  - Continue spironolactone 25 mg daily. - 2) [id number]  3) PAF: Maintaining In NSR . On Xarelto  4) ETOH abuse 5) CKD stage II - Watch closely with increased diuretics.  - 6) ?OSA - with  heavy snoring and no sleep study.  6) DMII- Start sliding scale. hgb A1C 6.1  7) LE Wound - wound care to see  Consult PT  He will need  HH when he is discharged.    Length of Stay: 1   Amy Clegg NP-C  08/29/2015, 7:22 AM  Advanced Heart Failure Team Pager [phone] (M-F; 7a - 4p)  Please contact [facility] for night-coverage after hours (4p -7a ) and weekends on [email or web address]  Patient seen and examined with Darrick Grinder, NP. We discussed all aspects of the encounter. I agree with the assessment and plan as stated above.   Volume status improving. Renal function stable. Continue IV diuresis. Agree with switching to [facility]. Cover Dm2 with SSI. Wound care to see.   Bensimhon, Daniel,MD 7:32 PM

## 2015-08-30 DIAGNOSIS — N179 Acute kidney failure, unspecified: Secondary | ICD-10-CM

## 2015-08-30 LAB — GLUCOSE, CAPILLARY: Glucose-Capillary: 133 mg/dL — ABNORMAL HIGH (ref 65–99)

## 2015-08-30 LAB — BASIC METABOLIC PANEL
ANION GAP: 9 (ref 5–15)
BUN: 22 mg/dL — AB (ref 6–20)
CHLORIDE: 98 mmol/L — AB (ref 101–111)
CO2: 29 mmol/L (ref 22–32)
Calcium: 10.5 mg/dL — ABNORMAL HIGH (ref 8.9–10.3)
Creatinine, Ser: 1.39 mg/dL — ABNORMAL HIGH (ref 0.61–1.24)
GFR calc Af Amer: >60 mL/min (ref >60)
GFR, EST NON AFRICAN AMERICAN: 53 mL/min — AB (ref >60)
GLUCOSE: 104 mg/dL — AB (ref 65–99)
POTASSIUM: 4.1 mmol/L (ref 3.5–5.1)
Sodium: 136 mmol/L (ref 135–145)

## 2015-08-30 MED ORDER — CARVEDILOL 6.25 MG PO TABS
6.2500 mg | ORAL_TABLET | Freq: Two times a day (BID) | ORAL | Status: DC
  Administered 2015-08-30 – 2015-09-03 (×6): 6.25 mg via ORAL
  Filled 2015-08-30 (×7): qty 1

## 2015-08-30 NOTE — Progress Notes (Addendum)
Advanced Heart Failure Rounding Note   Subjective:    Admitted from HF clinic with volume overload. Diuresing with IV lasix. Brisk diuresis noted. Weight down 10 pounds. Yesterday entresto was added.   Denies SOB/Dizziness.    Objective:   Weight Range:  Vital Signs:   Temp:  [97.4 F (36.3 C)-98.2 F (36.8 C)] 97.4 F (36.3 C) (08/04 0515) Pulse Rate:  [56-63] 56 (08/04 0515) Resp:  [18] 18 (08/04 0515) BP: (90-105)/(60-77) 90/60 (08/04 0515) SpO2:  [97 %-98 %] 98 % (08/04 0515) Weight:  [276 lb 3.2 oz (125.3 kg)] 276 lb 3.2 oz (125.3 kg) (08/04 0515) Last BM Date: 08/27/15  Weight change: Filed Weights   08/28/15 1647 08/29/15 0505 08/30/15 0515  Weight: 286 lb 1.6 oz (129.8 kg) 282 lb 3.2 oz (128 kg) 276 lb 3.2 oz (125.3 kg)    Intake/Output:   Intake/Output Summary (Last 24 hours) at 08/30/15 0748 Last data filed at 08/30/15 0500  Gross per 24 hour  Intake              840 ml  Output             3425 ml  Net            -2585 ml     Physical Exam: General:  Chroncially  ill. No resp difficulty. Sitting in the chairIn chair HEENT: normal Neck: supple. JVP thick neck. Elevated to jaw.  Carotids 2+ bilat; no bruits. No lymphadenopathy or thryomegaly appreciated. Cor: PMI nondisplaced. Regular rate & rhythm. No rubs, gallops or murmurs. Lungs: clear with mildly decreased BS throughout Abdomen: soft, nontender, nondistended. No hepatosplenomegaly. No bruits or masses. Good bowel sounds. Extremities: no cyanosis, clubbing, rash, RLE and LLE 2+ edema. RLE wound Neuro: alert & orientedx3, cranial nerves grossly intact. moves all 4 extremities w/o difficulty. Affect pleasant  Telemetry: Sinus Brady 60s PVCs.   Labs: Basic Metabolic Panel:  Recent Labs Lab 08/28/15 1506 08/29/15 0236 08/30/15 0417  NA 137 137 136  K 4.7 4.3 4.1  CL 108 104 98*  CO2 22 26 29   GLUCOSE 91 104* 104*  BUN 13 14 22*  CREATININE 0.97 1.04 1.39*  CALCIUM 9.9 9.8 10.5*  MG 2.1   --   --     Liver Function Tests:  Recent Labs Lab 08/28/15 1506  AST 31  ALT 27  ALKPHOS 74  BILITOT 0.5  PROT 6.9  ALBUMIN 3.7   No results for input(s): LIPASE, AMYLASE in the last 168 hours. No results for input(s): AMMONIA in the last 168 hours.  CBC:  Recent Labs Lab 08/28/15 1506  WBC 4.9  NEUTROABS 2.1  HGB 15.2  HCT 47.4  MCV 91.5  PLT 184    Cardiac Enzymes: No results for input(s): CKTOTAL, CKMB, CKMBINDEX, TROPONINI in the last 168 hours.  BNP: BNP (last 3 results)  Recent Labs  02/12/15 1522 07/10/15 1255 08/28/15 1506  BNP 113.6* 107.2* 63.2    ProBNP (last 3 results) No results for input(s): PROBNP in the last 8760 hours.    Other results:  Imaging: Dg Chest 2 View  Result Date: 08/29/2015 CLINICAL DATA:  Peripheral edema, shortness of breath for the past 2 weeks; former smoker, history of CHF, CVA, chronic renal insufficiency, hypertension, and diabetes EXAM: CHEST  2 VIEW COMPARISON:  PA and lateral chest x-ray of May 28, 2015 FINDINGS: The lungs are adequately inflated. The interstitial markings are mildly prominent but less conspicuous than on  the previous study. The cardiac silhouette remains enlarged. The pulmonary vascularity is not clearly engorged. There is tortuosity of the descending thoracic aorta. The bony thorax exhibits no acute abnormality. IMPRESSION: No overt evidence of CHF. Stable cardiomegaly and mild interstitial prominence. The latter may be due to the patient's smoking history. No pneumonia. Electronically Signed   By: David  [place]ique M.D.   On: 08/29/2015 08:17     Medications:     Scheduled Medications: . aspirin EC  81 mg Oral Daily  . carvedilol  12.5 mg Oral BID WC  . cloNIDine  0.1 mg Oral BID  . furosemide  80 mg Intravenous BID  . pantoprazole  40 mg Oral Daily  . QUEtiapine  800 mg Oral QHS  . rivaroxaban  20 mg Oral Q supper  . sacubitril-valsartan  1 tablet Oral BID  . sodium chloride flush  3 mL  Intravenous Q12H  . spironolactone  25 mg Oral Daily    Infusions:    PRN Medications: sodium chloride, acetaminophen, ondansetron (ZOFRAN) IV, sodium chloride flush   Assessment/Plan    1) Acute on chronic combined systolic/diastolic HF: EF A999333, grade II DD (10/2012) now decreased to 20-25% (05/2015) with decreased diastolic compliance, LAE, and RV severely reduced. Had [place] 2014 with minimal CAD.  - He has had Myoview scheduled this year but has not shown up for appointment x 2  Brisk diuresis noted with IV lasix. Volume status remains elevated. Continue lasix 80 mg twice a day.  -Cut back  coreg to 6.25 mg twice a day. This was cut back on admit with acute decompensation. Also had 1st degree heart block.  -Continue entresto 24-26 mg twice daily. Will not increase with soft BP.   - Continue spironolactone 25 mg daily. - Creatinine trending up 1.0>1.39 with volume overload. Check BMET in am. May need PICC for CVP and to guide diuresis. Does not appear low output.  - 2) [id number] today. Stop clonidine 3) PAF: Maintaining In NSR . On Xarelto  4) ETOH abuse 5) CKD stage II - Watch closely with increased diuretics.  - 6) ?OSA - with heavy snoring and no sleep study.  6) DMII- Start sliding scale. hgb A1C 6.1  7) LE Wound - wound care seeing 8) AKI - follow closely with diuresis  Consult PT  He will need  HH when he is discharged.   Length of Stay: 2 Amy Clegg NP-C  08/30/2015, 7:48 AM  Advanced Heart Failure Team Pager [phone] (M-F; 7a - 4p)  Please contact [facility] for night-coverage after hours (4p -7a ) and weekends on [email or web address]  Patient seen and examined with Darrick Grinder, NP. We discussed all aspects of the encounter. I agree with the assessment and plan as stated above.   Diuresing well. Nearing baseline weight (274). Still edematous. Will diurese one more day. Continue Entresto. Watch renal function closely. Agree with stopping clonidine. Will wrap legs.  Suspect creatinine may bump further.   Zaydon Kinser,MD 8:41 AM

## 2015-08-30 NOTE — Evaluation (Signed)
Physical Therapy Evaluation Patient Details Name: [name] [name] MRN: [id number] DOB: 01/28/1955 Today's Date: 08/30/2015   History of Present Illness  [name] [name] is a 60 y.o. 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 Had history of acute renal failure in setting of rhabdo requiring short term HD.  Clinical Impression  Patient presents with decreased mobility due to deficits listed in PT problem list below and he will benefit from follow up outpatient PT for continued activity progression as tolerated and home safety education.     Follow Up Recommendations Outpatient PT    Equipment Recommendations  Rolling walker with 5" wheels;3in1 (PT) (bariatric 3:1)    Recommendations for Other Services       Precautions / Restrictions Precautions Precautions: None      Mobility  Bed Mobility               General bed mobility comments: NT up in chair (sister reports sleeps in chair)  Transfers Overall transfer level: Modified independent Equipment used: Rolling walker (2 wheeled) Transfers: Sit to/from Stand Sit to Stand: Modified independent (Device/Increase time)            Ambulation/Gait Ambulation/Gait assistance: Supervision Ambulation Distance (Feet): 200 Feet Assistive device: Rolling walker (2 wheeled) Gait Pattern/deviations: Step-through pattern;Decreased stride length;Wide base of support;Trunk flexed     General Gait Details: stooped even with walker and relates SOB, though SpI2 94% on RA with ambulation  Stairs            Wheelchair Mobility    Modified Rankin (Stroke Patients Only)       Balance Overall balance assessment: No apparent balance deficits (not formally assessed)                                           Pertinent Vitals/Pain Pain Assessment: Faces Faces Pain Scale: Hurts little more Pain Location: lower back with ambulation Pain Descriptors / Indicators: Aching Pain Intervention(s): Monitored during session;Limited activity within patient's tolerance    Home Living Family/patient expects to be discharged to:: Private residence Living Arrangements: Alone Available Help at Discharge: Family;Personal care attendant;Available PRN/intermittently Type of Home: House Home Access: Stairs to enter Entrance Stairs-Rails: Right Entrance Stairs-Number of Steps: 3 Home Layout: One level Home Equipment: Cane - single point      Prior Function Level of Independence: Independent with assistive device(s)         Comments: sister reports patient very sedentary, only goes out to bus stop on Friday when needing to get disability check, limited by back pain, but unable to undergo surgery due to heart issues     Hand Dominance   Dominant Hand: Right    Extremity/Trunk Assessment               Lower Extremity Assessment: Overall WFL for tasks assessed      Cervical / Trunk Assessment: Other exceptions  Communication   Communication: No difficulties  Cognition Arousal/Alertness: Awake/alert Behavior During Therapy: WFL for tasks assessed/performed Overall Cognitive Status: Within Functional Limits for tasks assessed                      General Comments  Exercises        Assessment/Plan    PT Assessment Patient needs continued PT services  PT Diagnosis Difficulty walking;Abnormality of gait;Generalized weakness   PT Problem List Decreased activity tolerance;Decreased balance;Decreased strength;Cardiopulmonary status limiting activity;Decreased safety awareness  PT Treatment Interventions DME instruction;Gait training;Functional mobility training;Therapeutic exercise;Balance training;Therapeutic activities   PT Goals (Current goals can be  found in the Care Plan section) Acute Rehab PT Goals Patient Stated Goal: To get more activity PT Goal Formulation: With patient/family Time For Goal Achievement: 09/06/15 Potential to Achieve Goals: Good    Frequency Min 3X/week   Barriers to discharge        Co-evaluation               End of Session Equipment Utilized During Treatment: Gait belt Activity Tolerance: Patient tolerated treatment well Patient left: in chair;with call bell/phone within reach;with chair alarm set;with family/visitor present           Time: [id number] PT Time Calculation (min) (ACUTE ONLY): 16 min   Charges:   PT Evaluation $PT Eval Moderate Complexity: 1 Procedure     PT G CodesReginia Naas 09-26-15, 3:15 PM  Magda Kiel, [place] 2015-09-26

## 2015-08-30 NOTE — Consult Note (Signed)
   [facility] CM Inpatient Consult   08/30/2015  Kaimana A Gadsby 02/12/1955 [id number]  Patient screened for potential [facility] Care Management services. Patient is eligible for [facility] Care Management services under patient's [employer] Medicare  plan. Patient is a 60 year old admitted from HF clinic with volume overload.  Chart review reveals that patient will have EMMI HF program set up by inpatient RNCM.  Met with the patient at bedside to share contact information and services with [facility] Care Management.  Patient stated no needs.  States he use to have the tele-monitoring scales but didn't like it.  But he endorses he has a good scale from the HF clinic. Patient given a brochure with information.   Please place a [facility] Care Management consult for any changes or for questions contact:    , RN BSN CCM [facility] Liaison  [phone] business mobile phone Toll free office [phone]  

## 2015-08-31 DIAGNOSIS — I480 Paroxysmal atrial fibrillation: Secondary | ICD-10-CM

## 2015-08-31 LAB — BASIC METABOLIC PANEL
ANION GAP: 10 (ref 5–15)
BUN: 31 mg/dL — ABNORMAL HIGH (ref 6–20)
CHLORIDE: 97 mmol/L — AB (ref 101–111)
CO2: 26 mmol/L (ref 22–32)
Calcium: 10.3 mg/dL (ref 8.9–10.3)
Creatinine, Ser: 1.62 mg/dL — ABNORMAL HIGH (ref 0.61–1.24)
GFR calc non Af Amer: 44 mL/min — ABNORMAL LOW (ref >60)
GFR, EST AFRICAN AMERICAN: 51 mL/min — AB (ref >60)
Glucose, Bld: 126 mg/dL — ABNORMAL HIGH (ref 65–99)
Potassium: 4.3 mmol/L (ref 3.5–5.1)
SODIUM: 133 mmol/L — AB (ref 135–145)

## 2015-08-31 LAB — GLUCOSE, CAPILLARY
GLUCOSE-CAPILLARY: 106 mg/dL — AB (ref 65–99)
GLUCOSE-CAPILLARY: 143 mg/dL — AB (ref 65–99)
GLUCOSE-CAPILLARY: 123 mg/dL — AB (ref 65–99)

## 2015-08-31 MED ORDER — SODIUM CHLORIDE 0.9 % IV BOLUS (SEPSIS)
250.0000 mL | Freq: Once | INTRAVENOUS | Status: AC
  Administered 2015-08-31: 250 mL via INTRAVENOUS

## 2015-08-31 NOTE — Progress Notes (Signed)
Advanced Heart Failure Rounding Note   Subjective:    Admitted from HF clinic with volume overload.  Entresto added yesterday. Remains on IV lasix. Weight down 12 pounds. This am BP 70/50. Feels weak but otherwise ok. Not presyncopal. Creatinine up a bit.   Clonidine stopped yesterday   Objective:   Weight Range:  Vital Signs:   Temp:  [97.3 F (36.3 C)-97.9 F (36.6 C)] 97.3 F (36.3 C) (08/05 0516) Pulse Rate:  [60-78] 78 (08/05 0552) Resp:  [18] 18 (08/04 1213) BP: (70-103)/(42-72) 75/59 (08/05 0830) SpO2:  [97 %-100 %] 98 % (08/05 0516) Weight:  [124.6 kg (274 lb 11.1 oz)] 124.6 kg (274 lb 11.1 oz) (08/05 0516) Last BM Date: 08/27/15  Weight change: Filed Weights   08/29/15 0505 08/30/15 0515 08/31/15 0516  Weight: 128 kg (282 lb 3.2 oz) 125.3 kg (276 lb 3.2 oz) 124.6 kg (274 lb 11.1 oz)    Intake/Output:   Intake/Output Summary (Last 24 hours) at 08/31/15 0854 Last data filed at 08/31/15 0700  Gross per 24 hour  Intake             1110 ml  Output             2675 ml  Net            -1565 ml     Physical Exam: General:  Fatigued appearing No resp difficulty. Sitting in the chairIn chair HEENT: normal Neck: supple. JVP thick neck. No obvious JVD.  Carotids 2+ bilat; no bruits. No lymphadenopathy or thryomegaly appreciated. Cor: PMI nondisplaced. Regular rate & rhythm. No rubs, gallops or murmurs. Lungs: clear with mildly decreased BS throughout Abdomen: soft, nontender, nondistended. No hepatosplenomegaly. No bruits or masses. Good bowel sounds. Extremities: no cyanosis, clubbing, rash, RLE and LLE 2-3+ edema. RLE wound Neuro: alert & orientedx3, cranial nerves grossly intact. moves all 4 extremities w/o difficulty. Affect pleasant  Telemetry: Sinus Brady 60s PVCs.   Labs: Basic Metabolic Panel:  Recent Labs Lab 08/28/15 1506 08/29/15 0236 08/30/15 0417 08/31/15 0240  NA 137 137 136 133*  K 4.7 4.3 4.1 4.3  CL 108 104 98* 97*  CO2 22 26 29 26     GLUCOSE 91 104* 104* 126*  BUN 13 14 22* 31*  CREATININE 0.97 1.04 1.39* 1.62*  CALCIUM 9.9 9.8 10.5* 10.3  MG 2.1  --   --   --     Liver Function Tests:  Recent Labs Lab 08/28/15 1506  AST 31  ALT 27  ALKPHOS 74  BILITOT 0.5  PROT 6.9  ALBUMIN 3.7   No results for input(s): LIPASE, AMYLASE in the last 168 hours. No results for input(s): AMMONIA in the last 168 hours.  CBC:  Recent Labs Lab 08/28/15 1506  WBC 4.9  NEUTROABS 2.1  HGB 15.2  HCT 47.4  MCV 91.5  PLT 184    Cardiac Enzymes: No results for input(s): CKTOTAL, CKMB, CKMBINDEX, TROPONINI in the last 168 hours.  BNP: BNP (last 3 results)  Recent Labs  02/12/15 1522 07/10/15 1255 08/28/15 1506  BNP 113.6* 107.2* 63.2    ProBNP (last 3 results) No results for input(s): PROBNP in the last 8760 hours.    Other results:  Imaging: No results found.   Medications:     Scheduled Medications: . aspirin EC  81 mg Oral Daily  . carvedilol  6.25 mg Oral BID WC  . furosemide  80 mg Intravenous BID  . pantoprazole  40  mg Oral Daily  . QUEtiapine  800 mg Oral QHS  . rivaroxaban  20 mg Oral Q supper  . sacubitril-valsartan  1 tablet Oral BID  . sodium chloride flush  3 mL Intravenous Q12H  . spironolactone  25 mg Oral Daily    Infusions:    PRN Medications: sodium chloride, acetaminophen, ondansetron (ZOFRAN) IV, sodium chloride flush   Assessment/Plan    1) Acute on chronic combined systolic/diastolic HF: EF A999333, grade II DD (10/2012) now decreased to 20-25% (05/2015) with decreased diastolic compliance, LAE, and RV severely reduced. Had [place] 2014 with minimal CAD.  2) HTN:BP low today 3) PAF: Maintaining In NSR . On Xarelto  4) ETOH abuse 5) AKI  - 6) ?OSA - with heavy snoring and no sleep study.  6) DMII- Start sliding scale. hgb A1C 6.1  7) LE Wound - wound care seeing 8) AKI 9) Hypotension  He is at his baseline weight but BP very low today and renal function worse.  Will stop IV lasix and Entresto. Give 250cc NS. That said, he still has at least 2-3+ LE edema which may be related to RV failure of venous insufficiency. Will plan RHC early next week to further evaluate. Maintaining NSR.     Length of Stay: 3 Bensimhon, Daniel MD 08/31/2015, 8:54 AM Advanced Heart Failure Team Pager [phone] (M-F; [place])  Please contact [facility] for night-coverage after hours (4p -7a ) and weekends on [email or web address]

## 2015-08-31 NOTE — Progress Notes (Signed)
Pt BP was 70/42 manually, denies chest pain, denies nausea and vomiting, not in respiratory distress, asymptomatic. Dr. Gayleen Orem was informed, no new orders given at this time. Will continue to monitor pt

## 2015-09-01 LAB — BASIC METABOLIC PANEL
Anion gap: 11 (ref 5–15)
BUN: 33 mg/dL — AB (ref 6–20)
CHLORIDE: 95 mmol/L — AB (ref 101–111)
CO2: 28 mmol/L (ref 22–32)
CREATININE: 1.64 mg/dL — AB (ref 0.61–1.24)
Calcium: 10.5 mg/dL — ABNORMAL HIGH (ref 8.9–10.3)
GFR calc non Af Amer: 44 mL/min — ABNORMAL LOW (ref >60)
GFR, EST AFRICAN AMERICAN: 51 mL/min — AB (ref >60)
GLUCOSE: 110 mg/dL — AB (ref 65–99)
POTASSIUM: 4 mmol/L (ref 3.5–5.1)
Sodium: 134 mmol/L — ABNORMAL LOW (ref 135–145)

## 2015-09-01 MED ORDER — ASPIRIN 81 MG PO CHEW
81.0000 mg | CHEWABLE_TABLET | ORAL | Status: AC
Start: 2015-09-02 — End: 2015-09-02
  Administered 2015-09-02: 81 mg via ORAL
  Filled 2015-09-01: qty 1

## 2015-09-01 MED ORDER — SODIUM CHLORIDE 0.9 % IV SOLN
INTRAVENOUS | Status: DC
  Administered 2015-09-02: 06:00:00 via INTRAVENOUS

## 2015-09-01 MED ORDER — SODIUM CHLORIDE 0.9% FLUSH: 3.0000 mL | INTRAVENOUS | Status: DC | PRN

## 2015-09-01 MED ORDER — SODIUM CHLORIDE 0.9 % IV SOLN: 250.0000 mL | INTRAVENOUS | Status: DC | PRN

## 2015-09-01 MED ORDER — SODIUM CHLORIDE 0.9% FLUSH: 3.0000 mL | Freq: Two times a day (BID) | INTRAVENOUS | Status: DC

## 2015-09-01 NOTE — Progress Notes (Signed)
Advanced Heart Failure Rounding Note   Subjective:    Admitted from HF clinic with volume overload.  Clonid  Feels better today. Weight back up 3 pounds. Creatinine stable. SBP 80-90s  Objective:   Weight Range:  Vital Signs:   Temp:  [98 F (36.7 C)-98.1 F (36.7 C)] 98.1 F (36.7 C) (08/06 0510) Pulse Rate:  [68-100] 68 (08/06 0617) Resp:  [18] 18 (08/06 0510) BP: (87-102)/(56-65) 97/56 (08/06 0617) SpO2:  [95 %-100 %] 100 % (08/06 0510) Weight:  [125.8 kg (277 lb 6.4 oz)] 125.8 kg (277 lb 6.4 oz) (08/06 0510) Last BM Date: 08/27/15  Weight change: Filed Weights   08/30/15 0515 08/31/15 0516 09/01/15 0510  Weight: 125.3 kg (276 lb 3.2 oz) 124.6 kg (274 lb 11.1 oz) 125.8 kg (277 lb 6.4 oz)    Intake/Output:   Intake/Output Summary (Last 24 hours) at 09/01/15 0901 Last data filed at 09/01/15 0645  Gross per 24 hour  Intake              600 ml  Output             1550 ml  Net             -950 ml     Physical Exam: General:  Fatigued appearing No resp difficulty. Sitting in the chair HEENT: normal Neck: supple. JVP thick neck hard to see JVP maybe 7-8 Carotids 2+ bilat; no bruits. No lymphadenopathy or thryomegaly appreciated. Cor: PMI nondisplaced. Regular rate & rhythm. No rubs, gallops or murmurs. Lungs: clear with mildly decreased BS throughout Abdomen: soft, nontender, nondistended. No hepatosplenomegaly. No bruits or masses. Good bowel sounds. Extremities: no cyanosis, clubbing, rash, RLE and LLE 2-3+ edema. RLE wound  Neuro: alert & orientedx3, cranial nerves grossly intact. moves all 4 extremities w/o difficulty. Affect pleasant  Telemetry: Sinus Brady 60s PVCs.   Labs: Basic Metabolic Panel:  Recent Labs Lab 08/28/15 1506 08/29/15 0236 08/30/15 0417 08/31/15 0240 09/01/15 0549  NA 137 137 136 133* 134*  K 4.7 4.3 4.1 4.3 4.0  CL 108 104 98* 97* 95*  CO2 22 26 29 26 28   GLUCOSE 91 104* 104* 126* 110*  BUN 13 14 22* 31* 33*  CREATININE 0.97  1.04 1.39* 1.62* 1.64*  CALCIUM 9.9 9.8 10.5* 10.3 10.5*  MG 2.1  --   --   --   --     Liver Function Tests:  Recent Labs Lab 08/28/15 1506  AST 31  ALT 27  ALKPHOS 74  BILITOT 0.5  PROT 6.9  ALBUMIN 3.7   No results for input(s): LIPASE, AMYLASE in the last 168 hours. No results for input(s): AMMONIA in the last 168 hours.  CBC:  Recent Labs Lab 08/28/15 1506  WBC 4.9  NEUTROABS 2.1  HGB 15.2  HCT 47.4  MCV 91.5  PLT 184    Cardiac Enzymes: No results for input(s): CKTOTAL, CKMB, CKMBINDEX, TROPONINI in the last 168 hours.  BNP: BNP (last 3 results)  Recent Labs  02/12/15 1522 07/10/15 1255 08/28/15 1506  BNP 113.6* 107.2* 63.2    ProBNP (last 3 results) No results for input(s): PROBNP in the last 8760 hours.    Other results:  Imaging: No results found.   Medications:     Scheduled Medications: . aspirin EC  81 mg Oral Daily  . carvedilol  6.25 mg Oral BID WC  . pantoprazole  40 mg Oral Daily  . QUEtiapine  800 mg Oral  QHS  . rivaroxaban  20 mg Oral Q supper  . sodium chloride flush  3 mL Intravenous Q12H  . spironolactone  25 mg Oral Daily    Infusions:    PRN Medications: sodium chloride, acetaminophen, ondansetron (ZOFRAN) IV, sodium chloride flush   Assessment/Plan    1) Acute on chronic combined systolic/diastolic HF: EF A999333, grade II DD (10/2012) now decreased to 20-25% (05/2015) with decreased diastolic compliance, LAE, and RV severely reduced. Had [place] 2014 with minimal CAD.  2) HTN:BP low today 3) PAF: Maintaining In NSR . On Xarelto  4) ETOH abuse 5) AKI  - 6) ?OSA - with heavy snoring and no sleep study.  6) DMII- Start sliding scale. hgb A1C 6.1  7) LE Wound - wound care seeing 8) AKI 9) Hypotension  He is near his baseline weight but BP remains low and renal function worse. That said, he still has at least 2-3+ LE edema which may be related to RV failure of venous insufficiency. Will resume po diuretics.  Plan RHC tomorrow to further evaluate. Maintaining NSR.     Length of Stay: 4 Payal Stanforth MD 09/01/2015, 9:01 AM Advanced Heart Failure Team Pager [phone] (M-F; [place])  Please contact [facility] for night-coverage after hours (4p -7a ) and weekends on [email or web address]

## 2015-09-01 NOTE — Progress Notes (Signed)
Report received in patient's room using SBAR format, reviewed orders, labs, VS, meds and patient's general condition, assumed care of patient.

## 2015-09-01 NOTE — Discharge Instructions (Signed)
Information on my medicine - XARELTO (Rivaroxaban)  This medication education was reviewed with me or my healthcare representative as part of my discharge preparation.  The pharmacist that spoke with me during my hospital stay was:  Deboraha Sprang, [facility]  Why was Xarelto prescribed for you? Xarelto was prescribed for you to reduce the risk of a blood clot forming that can cause a stroke if you have a medical condition called atrial fibrillation (a type of irregular heartbeat).  What do you need to know about xarelto ? Take your Xarelto ONCE DAILY at the same time every day with your evening meal. If you have difficulty swallowing the tablet whole, you may crush it and mix in applesauce just prior to taking your dose.  Take Xarelto exactly as prescribed by your doctor and DO NOT stop taking Xarelto without talking to the doctor who prescribed the medication.  Stopping without other stroke prevention medication to take the place of Xarelto may increase your risk of developing a clot that causes a stroke.  Refill your prescription before you run out.  After discharge, you should have regular check-up appointments with your healthcare provider that is prescribing your Xarelto.  In the future your dose may need to be changed if your kidney function or weight changes by a significant amount.  What do you do if you miss a dose? If you are taking Xarelto ONCE DAILY and you miss a dose, take it as soon as you remember on the same day then continue your regularly scheduled once daily regimen the next day. Do not take two doses of Xarelto at the same time or on the same day.   Important Safety Information A possible side effect of Xarelto is bleeding. You should call your healthcare provider right away if you experience any of the following: ? Bleeding from an injury or your nose that does not stop. ? Unusual colored urine (red or dark brown) or unusual colored stools (red or  black). ? Unusual bruising for unknown reasons. ? A serious fall or if you hit your head (even if there is no bleeding).  Some medicines may interact with Xarelto and might increase your risk of bleeding while on Xarelto. To help avoid this, consult your healthcare provider or pharmacist prior to using any new prescription or non-prescription medications, including herbals, vitamins, non-steroidal anti-inflammatory drugs (NSAIDs) and supplements.  This website has more information on Xarelto: [email or web address]

## 2015-09-02 ENCOUNTER — Encounter (HOSPITAL_COMMUNITY)
Admission: AD | Disposition: A | Discharge status: Home / Self Care | Payer: Self-pay | Source: Ambulatory Visit | Attending: Internal Medicine

## 2015-09-02 ENCOUNTER — Encounter (HOSPITAL_COMMUNITY): Payer: Self-pay | Admitting: Internal Medicine

## 2015-09-02 HISTORY — PX: CARDIAC CATHETERIZATION: SHX172

## 2015-09-02 LAB — BASIC METABOLIC PANEL
Anion gap: 9 (ref 5–15)
BUN: 27 mg/dL — AB (ref 6–20)
CALCIUM: 10.4 mg/dL — AB (ref 8.9–10.3)
CO2: 26 mmol/L (ref 22–32)
CREATININE: 1.37 mg/dL — AB (ref 0.61–1.24)
Chloride: 98 mmol/L — ABNORMAL LOW (ref 101–111)
GFR calc Af Amer: >60 mL/min (ref >60)
GFR, EST NON AFRICAN AMERICAN: 54 mL/min — AB (ref >60)
Glucose, Bld: 129 mg/dL — ABNORMAL HIGH (ref 65–99)
Potassium: 4.4 mmol/L (ref 3.5–5.1)
SODIUM: 133 mmol/L — AB (ref 135–145)

## 2015-09-02 LAB — POCT I-STAT 3, VENOUS BLOOD GAS (G3P V)
ACID-BASE EXCESS: 1 mmol/L (ref 0.0–2.0)
Acid-Base Excess: 2 mmol/L (ref 0.0–2.0)
BICARBONATE: 27 meq/L — AB (ref 20.0–24.0)
BICARBONATE: 27.9 meq/L — AB (ref 20.0–24.0)
O2 SAT: 58 %
O2 SAT: 58 %
PCO2 VEN: 48.5 mmHg (ref 45.0–50.0)
PCO2 VEN: 48.6 mmHg (ref 45.0–50.0)
PH VEN: 7.354 — AB (ref 7.250–7.300)
PO2 VEN: 32 mmHg (ref 31.0–45.0)
TCO2: 29 mmol/L (ref 0–100)
TCO2: 28 mmol/L (ref 0–100)
pH, Ven: 7.367 — ABNORMAL HIGH (ref 7.250–7.300)
pO2, Ven: 32 mmHg (ref 31.0–45.0)

## 2015-09-02 SURGERY — RIGHT HEART CATH
Anesthesia: LOCAL

## 2015-09-02 MED ORDER — HEPARIN (PORCINE) IN NACL 2-0.9 UNIT/ML-% IJ SOLN
INTRAMUSCULAR | Status: AC
  Filled 2015-09-02: qty 500

## 2015-09-02 MED ORDER — RIVAROXABAN 20 MG PO TABS: 20.0000 mg | ORAL_TABLET | Freq: Every day | ORAL | Status: DC

## 2015-09-02 MED ORDER — FENTANYL CITRATE (PF) 100 MCG/2ML IJ SOLN
INTRAMUSCULAR | Status: AC
  Filled 2015-09-02: qty 2

## 2015-09-02 MED ORDER — SODIUM CHLORIDE 0.9% FLUSH: 3.0000 mL | INTRAVENOUS | Status: DC | PRN

## 2015-09-02 MED ORDER — SODIUM CHLORIDE 0.9 % IV SOLN: 250.0000 mL | INTRAVENOUS | Status: DC | PRN

## 2015-09-02 MED ORDER — HEPARIN (PORCINE) IN NACL 2-0.9 UNIT/ML-% IJ SOLN
INTRAMUSCULAR | Status: DC | PRN
  Administered 2015-09-02: 500 mL

## 2015-09-02 MED ORDER — SODIUM CHLORIDE 0.9% FLUSH
3.0000 mL | Freq: Two times a day (BID) | INTRAVENOUS | Status: DC
  Administered 2015-09-02 – 2015-09-03 (×2): 3 mL via INTRAVENOUS

## 2015-09-02 MED ORDER — LIDOCAINE HCL (PF) 1 % IJ SOLN
INTRAMUSCULAR | Status: DC | PRN
  Administered 2015-09-02: 2 mL

## 2015-09-02 MED ORDER — LIDOCAINE HCL (PF) 1 % IJ SOLN
INTRAMUSCULAR | Status: AC
  Filled 2015-09-02: qty 30

## 2015-09-02 MED ORDER — ACETAMINOPHEN 325 MG PO TABS: 650.0000 mg | ORAL_TABLET | ORAL | Status: DC | PRN

## 2015-09-02 MED ORDER — FENTANYL CITRATE (PF) 100 MCG/2ML IJ SOLN
INTRAMUSCULAR | Status: DC | PRN
  Administered 2015-09-02 (×2): 25 ug via INTRAVENOUS

## 2015-09-02 MED ORDER — ONDANSETRON HCL 4 MG/2ML IJ SOLN: 4.0000 mg | Freq: Four times a day (QID) | INTRAMUSCULAR | Status: DC | PRN

## 2015-09-02 SURGICAL SUPPLY — 10 items
CATH BALLN WEDGE 5F 110CM (CATHETERS) ×1 IMPLANT
GUIDEWIRE .025 260CM (WIRE) ×1 IMPLANT
HOVERMATT SINGLE USE (MISCELLANEOUS) ×1 IMPLANT
PACK CARDIAC CATHETERIZATION (CUSTOM PROCEDURE TRAY) ×2 IMPLANT
PROTECTION STATION PRESSURIZED (MISCELLANEOUS) ×2
SHEATH FAST CATH BRACH 5F 5CM (SHEATH) ×1 IMPLANT
STATION PROTECTION PRESSURIZED (MISCELLANEOUS) IMPLANT
TRANSDUCER W/STOPCOCK (MISCELLANEOUS) ×3 IMPLANT
TUBING ART PRESS 72  MALE/MALE (TUBING) ×1 IMPLANT
TUBING CIL FLEX 10 FLL-RA (TUBING) ×2 IMPLANT

## 2015-09-02 NOTE — Interval H&P Note (Signed)
History and Physical Interval Note:  09/02/2015 8:32 AM  [name] [name]  has presented today for surgery, with the diagnosis of CHF  The various methods of treatment have been discussed with the patient and family. After consideration of risks, benefits and other options for treatment, the patient has consented to  Procedure(s): Right Heart Cath (N/A) as a surgical intervention .  The patient's history has been reviewed, patient examined, no change in status, stable for surgery.  I have reviewed the patient's chart and labs.  Questions were answered to the patient's satisfaction.     [name] [name], Quillian Quince

## 2015-09-02 NOTE — Progress Notes (Signed)
Physical Therapy Treatment Patient Details Name: [name] [name] MRN: [id number] DOB: 1955-09-21 Today's Date: 09/02/2015    History of Present Illness [name] [name] is a 60 y.o. 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 Had history of acute renal failure in setting of rhabdo requiring short term HD.    PT Comments    Patient tolerated ambulation x 220 ft on room air with SaO2 92% and able to talk throughout. He feels strongly re: OPPT and not HHPT (which he has done before and feels he needs to progress beyond HHPT).   Follow Up Recommendations  Outpatient PT     Equipment Recommendations  Other (comment);3in1 (PT) (bariatric RW (due to body habitus and feet turn out);)    Recommendations for Other Services       Precautions / Restrictions Precautions Precautions: None    Mobility  Bed Mobility                  Transfers Overall transfer level: Modified independent Equipment used: None Transfers: Sit to/from Stand Sit to Stand: Modified independent (Device/Increase time)         General transfer comment: use of armrests  Ambulation/Gait Ambulation/Gait assistance: Supervision Ambulation Distance (Feet): 220 Feet Assistive device: Rolling walker (2 wheeled) Gait Pattern/deviations: Step-through pattern;Decreased stride length;Trunk flexed;Wide base of support   Gait velocity interpretation: Below normal speed for age/gender General Gait Details: pt with feet rotated outward when walking, in conjunction with wide BOS due to size and he has to walk behind RW or his feet and legs of RW will get entangled (stoops due to RW so far ahead);   Stairs Stairs:  (pt reports no concerns re: stairs into home (with rail))          Wheelchair  Mobility    Modified Rankin (Stroke Patients Only)       Balance Overall balance assessment: Needs assistance         Standing balance support: No upper extremity supported;During functional activity Standing balance-Leahy Scale: Good                      Cognition Arousal/Alertness: Awake/alert Behavior During Therapy: WFL for tasks assessed/performed Overall Cognitive Status: Within Functional Limits for tasks assessed                      Exercises      General Comments General comments (skin integrity, edema, etc.): Discussed ?option of OPPT with HHRN; pt prefers to go to OPPT      Pertinent Vitals/Pain Pain Assessment: No/denies pain    Home Living                      Prior Function            PT Goals (current goals can now be found in the care plan section) Acute Rehab PT Goals Patient Stated Goal: To be able to go out of my house Time For Goal Achievement: 09/06/15 Progress towards PT goals: Progressing toward goals    Frequency  Min 3X/week    PT Plan Current plan remains appropriate    Co-evaluation             End of Session   Activity Tolerance: Patient tolerated treatment well Patient  left: in chair;with call bell/phone within reach;with chair alarm set     Time: [id number] PT Time Calculation (min) (ACUTE ONLY): 13 min  Charges:  $Gait Training: 8-22 mins                    G Codes:      [name] [name] 09-15-2015, 5:14 PM Pager [phone]

## 2015-09-02 NOTE — Progress Notes (Signed)
Advanced Heart Failure Rounding Note   Subjective:    Feels ok this am. Weight unchanged. Diuretics on hold. Creatinine back down to 1.3  Objective:   Weight Range:  Vital Signs:   Temp:  [97.4 F (36.3 C)-97.7 F (36.5 C)] 97.7 F (36.5 C) (08/07 0500) Pulse Rate:  [66-77] 66 (08/07 0500) Resp:  [16-18] 17 (08/07 0500) BP: (102-119)/(66-73) 119/71 (08/07 0500) SpO2:  [95 %-100 %] 95 % (08/07 0817) Weight:  [125.8 kg (277 lb 4.8 oz)] 125.8 kg (277 lb 4.8 oz) (08/07 0500) Last BM Date: 08/31/15  Weight change: Filed Weights   08/31/15 0516 09/01/15 0510 09/02/15 0500  Weight: 124.6 kg (274 lb 11.1 oz) 125.8 kg (277 lb 6.4 oz) 125.8 kg (277 lb 4.8 oz)    Intake/Output:   Intake/Output Summary (Last 24 hours) at 09/02/15 0825 Last data filed at 09/02/15 0537  Gross per 24 hour  Intake              590 ml  Output             1750 ml  Net            -1160 ml     Physical Exam: General:  Fatigued appearing No resp difficulty. Sitting in the chair HEENT: normal Neck: supple. JVP thick neck hard to see  Carotids 2+ bilat; no bruits. No lymphadenopathy or thryomegaly appreciated. Cor: PMI nondisplaced. Regular rate & rhythm. No rubs, gallops or murmurs. Lungs: clear with mildly decreased BS throughout Abdomen: soft, nontender, nondistended. No hepatosplenomegaly. No bruits or masses. Good bowel sounds. Extremities: no cyanosis, clubbing, rash, RLE and LLE 2+ edema. RLE wound  Neuro: alert & orientedx3, cranial nerves grossly intact. moves all 4 extremities w/o difficulty. Affect pleasant  Telemetry: Sinus Brady 60s PVCs.   Labs: Basic Metabolic Panel:  Recent Labs Lab 08/28/15 1506 08/29/15 0236 08/30/15 0417 08/31/15 0240 09/01/15 0549 09/02/15 0411  NA 137 137 136 133* 134* 133*  K 4.7 4.3 4.1 4.3 4.0 4.4  CL 108 104 98* 97* 95* 98*  CO2 22 26 29 26 28 26   GLUCOSE 91 104* 104* 126* 110* 129*  BUN 13 14 22* 31* 33* 27*  CREATININE 0.97 1.04 1.39* 1.62*  1.64* 1.37*  CALCIUM 9.9 9.8 10.5* 10.3 10.5* 10.4*  MG 2.1  --   --   --   --   --     Liver Function Tests:  Recent Labs Lab 08/28/15 1506  AST 31  ALT 27  ALKPHOS 74  BILITOT 0.5  PROT 6.9  ALBUMIN 3.7   No results for input(s): LIPASE, AMYLASE in the last 168 hours. No results for input(s): AMMONIA in the last 168 hours.  CBC:  Recent Labs Lab 08/28/15 1506  WBC 4.9  NEUTROABS 2.1  HGB 15.2  HCT 47.4  MCV 91.5  PLT 184    Cardiac Enzymes: No results for input(s): CKTOTAL, CKMB, CKMBINDEX, TROPONINI in the last 168 hours.  BNP: BNP (last 3 results)  Recent Labs  02/12/15 1522 07/10/15 1255 08/28/15 1506  BNP 113.6* 107.2* 63.2    ProBNP (last 3 results) No results for input(s): PROBNP in the last 8760 hours.    Other results:  Imaging: No results found.   Medications:     Scheduled Medications: . [MAR Hold] aspirin EC  81 mg Oral Daily  . [MAR Hold] carvedilol  6.25 mg Oral BID WC  . [MAR Hold] pantoprazole  40 mg Oral  Daily  . [MAR Hold] QUEtiapine  800 mg Oral QHS  . [MAR Hold] rivaroxaban  20 mg Oral Q supper  . [MAR Hold] sodium chloride flush  3 mL Intravenous Q12H  . sodium chloride flush  3 mL Intravenous Q12H  . [MAR Hold] spironolactone  25 mg Oral Daily    Infusions: . sodium chloride 10 mL/hr at 09/02/15 0627    PRN Medications: [MAR Hold] sodium chloride, sodium chloride, [MAR Hold] acetaminophen, [MAR Hold] ondansetron (ZOFRAN) IV, [MAR Hold] sodium chloride flush, sodium chloride flush   Assessment/Plan    1) Acute on chronic combined systolic/diastolic HF: EF A999333, grade II DD (10/2012) now decreased to 20-25% (05/2015) with decreased diastolic compliance, LAE, and RV severely reduced. Had [place] 2014 with minimal CAD.  2) HTN:BP low today 3) PAF: Maintaining In NSR . On Xarelto  4) ETOH abuse 5) AKI  - 6) ?OSA - with heavy snoring and no sleep study.  6) DMII- Start sliding scale. hgb A1C 6.1  7) LE Wound -  wound care seeing 8) AKI 9) Hypotension  Creatinine improved with holding diuretics but still with signifcant edema. BP soft. Will plan RHC today assess hemodynamics.  Maintaining NSR. Xarelto on hold for procedure.    Length of Stay: 5 Yakir Wenke MD 09/02/2015, 8:25 AM Advanced Heart Failure Team Pager [phone] (M-F; [place])  Please contact [facility] for night-coverage after hours (4p -7a ) and weekends on [email or web address]

## 2015-09-02 NOTE — Progress Notes (Signed)
PT Cancellation Note  Patient Details Name: [name] [name] MRN: [id number] DOB: 1955/06/11   Cancelled Treatment:    Reason Eval/Treat Not Completed: Patient's level of consciousness   Patient recently returned from [place] heart cath (via Rt brachial artery per Darrick Grinder, NP). He is still very drowsy (falling asleep mid-sentence). Will allow meds to further wear off and assess mobility.   [name] 09/02/2015, 10:54 AM  Pager [phone]

## 2015-09-02 NOTE — H&P (View-Only) (Signed)
Advanced Heart Failure Rounding Note   Subjective:    Feels ok this am. Weight unchanged. Diuretics on hold. Creatinine back down to 1.3  Objective:   Weight Range:  Vital Signs:   Temp:  [97.4 F (36.3 C)-97.7 F (36.5 C)] 97.7 F (36.5 C) (08/07 0500) Pulse Rate:  [66-77] 66 (08/07 0500) Resp:  [16-18] 17 (08/07 0500) BP: (102-119)/(66-73) 119/71 (08/07 0500) SpO2:  [95 %-100 %] 95 % (08/07 0817) Weight:  [125.8 kg (277 lb 4.8 oz)] 125.8 kg (277 lb 4.8 oz) (08/07 0500) Last BM Date: 08/31/15  Weight change: Filed Weights   08/31/15 0516 09/01/15 0510 09/02/15 0500  Weight: 124.6 kg (274 lb 11.1 oz) 125.8 kg (277 lb 6.4 oz) 125.8 kg (277 lb 4.8 oz)    Intake/Output:   Intake/Output Summary (Last 24 hours) at 09/02/15 0825 Last data filed at 09/02/15 0537  Gross per 24 hour  Intake              590 ml  Output             1750 ml  Net            -1160 ml     Physical Exam: General:  Fatigued appearing No resp difficulty. Sitting in the chair HEENT: normal Neck: supple. JVP thick neck hard to see  Carotids 2+ bilat; no bruits. No lymphadenopathy or thryomegaly appreciated. Cor: PMI nondisplaced. Regular rate & rhythm. No rubs, gallops or murmurs. Lungs: clear with mildly decreased BS throughout Abdomen: soft, nontender, nondistended. No hepatosplenomegaly. No bruits or masses. Good bowel sounds. Extremities: no cyanosis, clubbing, rash, RLE and LLE 2+ edema. RLE wound  Neuro: alert & orientedx3, cranial nerves grossly intact. moves all 4 extremities w/o difficulty. Affect pleasant  Telemetry: Sinus Brady 60s PVCs.   Labs: Basic Metabolic Panel:  Recent Labs Lab 08/28/15 1506 08/29/15 0236 08/30/15 0417 08/31/15 0240 09/01/15 0549 09/02/15 0411  NA 137 137 136 133* 134* 133*  K 4.7 4.3 4.1 4.3 4.0 4.4  CL 108 104 98* 97* 95* 98*  CO2 22 26 29 26 28 26   GLUCOSE 91 104* 104* 126* 110* 129*  BUN 13 14 22* 31* 33* 27*  CREATININE 0.97 1.04 1.39* 1.62*  1.64* 1.37*  CALCIUM 9.9 9.8 10.5* 10.3 10.5* 10.4*  MG 2.1  --   --   --   --   --     Liver Function Tests:  Recent Labs Lab 08/28/15 1506  AST 31  ALT 27  ALKPHOS 74  BILITOT 0.5  PROT 6.9  ALBUMIN 3.7   No results for input(s): LIPASE, AMYLASE in the last 168 hours. No results for input(s): AMMONIA in the last 168 hours.  CBC:  Recent Labs Lab 08/28/15 1506  WBC 4.9  NEUTROABS 2.1  HGB 15.2  HCT 47.4  MCV 91.5  PLT 184    Cardiac Enzymes: No results for input(s): CKTOTAL, CKMB, CKMBINDEX, TROPONINI in the last 168 hours.  BNP: BNP (last 3 results)  Recent Labs  02/12/15 1522 07/10/15 1255 08/28/15 1506  BNP 113.6* 107.2* 63.2    ProBNP (last 3 results) No results for input(s): PROBNP in the last 8760 hours.    Other results:  Imaging: No results found.   Medications:     Scheduled Medications: . [MAR Hold] aspirin EC  81 mg Oral Daily  . [MAR Hold] carvedilol  6.25 mg Oral BID WC  . [MAR Hold] pantoprazole  40 mg Oral  Daily  . [MAR Hold] QUEtiapine  800 mg Oral QHS  . [MAR Hold] rivaroxaban  20 mg Oral Q supper  . [MAR Hold] sodium chloride flush  3 mL Intravenous Q12H  . sodium chloride flush  3 mL Intravenous Q12H  . [MAR Hold] spironolactone  25 mg Oral Daily    Infusions: . sodium chloride 10 mL/hr at 09/02/15 0627    PRN Medications: [MAR Hold] sodium chloride, sodium chloride, [MAR Hold] acetaminophen, [MAR Hold] ondansetron (ZOFRAN) IV, [MAR Hold] sodium chloride flush, sodium chloride flush   Assessment/Plan    1) Acute on chronic combined systolic/diastolic HF: EF A999333, grade II DD (10/2012) now decreased to 20-25% (05/2015) with decreased diastolic compliance, LAE, and RV severely reduced. Had [place] 2014 with minimal CAD.  2) HTN:BP low today 3) PAF: Maintaining In NSR . On Xarelto  4) ETOH abuse 5) AKI  - 6) ?OSA - with heavy snoring and no sleep study.  6) DMII- Start sliding scale. hgb A1C 6.1  7) LE Wound -  wound care seeing 8) AKI 9) Hypotension  Creatinine improved with holding diuretics but still with signifcant edema. BP soft. Will plan RHC today assess hemodynamics.  Maintaining NSR. Xarelto on hold for procedure.    Length of Stay: 5 Bensimhon, Daniel MD 09/02/2015, 8:25 AM Advanced Heart Failure Team Pager [phone] (M-F; [place])  Please contact [facility] for night-coverage after hours (4p -7a ) and weekends on [email or web address]

## 2015-09-03 LAB — BASIC METABOLIC PANEL
ANION GAP: 6 (ref 5–15)
BUN: 23 mg/dL — ABNORMAL HIGH (ref 6–20)
CALCIUM: 10.5 mg/dL — AB (ref 8.9–10.3)
CO2: 28 mmol/L (ref 22–32)
Chloride: 100 mmol/L — ABNORMAL LOW (ref 101–111)
Creatinine, Ser: 1.32 mg/dL — ABNORMAL HIGH (ref 0.61–1.24)
GFR, EST NON AFRICAN AMERICAN: 57 mL/min — AB (ref >60)
GLUCOSE: 128 mg/dL — AB (ref 65–99)
Potassium: 4.7 mmol/L (ref 3.5–5.1)
Sodium: 134 mmol/L — ABNORMAL LOW (ref 135–145)

## 2015-09-03 MED ORDER — CARVEDILOL 6.25 MG PO TABS: 6.2500 mg | ORAL_TABLET | Freq: Two times a day (BID) | ORAL | 6 refills | Status: DC

## 2015-09-03 MED ORDER — TORSEMIDE 20 MG PO TABS: 60.0000 mg | ORAL_TABLET | Freq: Every day | ORAL | Status: DC

## 2015-09-03 MED ORDER — TORSEMIDE 20 MG PO TABS: 60.0000 mg | ORAL_TABLET | Freq: Every day | ORAL | 6 refills | Status: DC

## 2015-09-03 MED ORDER — POTASSIUM CHLORIDE ER 20 MEQ PO TBCR: 20.0000 meq | EXTENDED_RELEASE_TABLET | Freq: Every day | ORAL | 6 refills | Status: DC

## 2015-09-03 NOTE — Care Management Important Message (Signed)
Important Message  Patient Details  Name: [name] [name] MRN: [id number] Date of Birth: [birth year]   Medicare Important Message Given:  Yes    Loann Quill 09/03/2015, 8:31 AM

## 2015-09-03 NOTE — Progress Notes (Signed)
Pt has orders to be discharged. Discharge instructions given and pt has no additional questions at this time. Medication regimen reviewed and pt educated. Pt verbalized understanding and has no additional questions. Telemetry box removed. IV removed and site in good condition. Pt stable and waiting for transportation.   Virna Livengood RN 

## 2015-09-03 NOTE — Discharge Summary (Signed)
Advanced Heart Failure Team  Discharge Summary   Patient ID: [name] [name] MRN: [id number], DOB/AGE: [birth year] 61 y.o. Admit date: 08/28/2015 D/C date:     09/03/2015   Primary Discharge Diagnoses:  1) Acute on chronic combined systolic/diastolic HF: EF  98-33% (08/2503) with decreased diastolic compliance, LAE, and RV severely reduced. Had [place] 2014 with minimal CAD.  2) HTN:BP ok today 3) PAF: Maintaining In NSR . On Xarelto  4) ETOH abuse 5) AKI  6) ?OSA - with heavy snoring and no sleep study.  6) DMII- Start sliding scale. 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 Had history of acute renal failure in setting of rhabdo requiring short term HD.   Admitted with marked volume overload. Diuresed with IV lasix but due to worsening renal function and concern for low out put he had a RHC as noted below. Volume status was stable and he  transitioned to torsemide 60 mg daily which was a reduced dose of his home torsemide regimen. Overall he diuresed 14 pounds. Renal function was followed closely and at the time of discharge creatinine was 1.37. He will continue on a reduced dose of bb. Ace was stooped due to elevated creatinine and soft blood pressure.   He maintained as NSR and continued on Xarelto. AHC and Paramedicine consulted for HF follow up. He will be difficult to manage outpatient because he has ongoing lower extremity edema.   RHC 09/02/2015 RA = 7 RV = 35/10 PA = 37/15 (25) PCW = 10 Fick cardiac output/index = 4.3/1.7 PVR = 3.5 WU Ao sat = 95% PA sat = 58%, 58% Assessment: 1. Volume status looks good. 2. Cardiac output  depressed   Discharge Weight: 272 pounds  Discharge Vitals: Blood pressure 109/76, pulse 64, temperature 97.8 F (36.6 C), temperature source Oral, resp. rate 18, height 6' (1.829 m), weight 272 lb 11 oz (123.7 kg), SpO2 97 %.  Labs: Lab Results  Component Value Date   WBC 4.9 08/28/2015   HGB 15.2 08/28/2015   HCT 47.4 08/28/2015   MCV 91.5 08/28/2015   PLT 184 08/28/2015    Recent Labs Lab 08/28/15 1506  09/03/15 0329  NA 137  < > 134*  K 4.7  < > 4.7  CL 108  < > 100*  CO2 22  < > 28  BUN 13  < > 23*  CREATININE 0.97  < > 1.32*  CALCIUM 9.9  < > 10.5*  PROT 6.9  --   --   BILITOT 0.5  --   --   ALKPHOS 74  --   --   ALT 27  --   --   AST 31  --   --   GLUCOSE 91  < > 128*  < > = values in this interval not displayed. Lab Results  Component Value Date   CHOL 184 08/28/2015   HDL 30 (L) 08/28/2015   LDLCALC 116 (H) 08/28/2015   TRIG 190 (H) 08/28/2015   BNP (last 3 results)  Recent Labs  02/12/15 1522 07/10/15 1255 08/28/15 1506  BNP 113.6* 107.2* 63.2    ProBNP (last 3 results) No results for input(s):  PROBNP in the last 8760 hours.   Diagnostic Studies/Procedures   No results found.  Discharge Medications     Medication List    STOP taking these medications   aspirin EC 81 MG tablet   cloNIDine 0.1 MG tablet Commonly known as:  CATAPRES   lisinopril 20 MG tablet Commonly known as:  PRINIVIL,ZESTRIL     TAKE these medications   ACCU-CHEK NANO SMARTVIEW w/Device Kit Use as instructed to test four times a day. DX: 250.92, insulin requiring   acetaminophen 500 MG tablet Commonly known as:  TYLENOL Take 2 tablets (1,000 mg total) by mouth every 8 (eight) hours as needed.   carvedilol 6.25 MG tablet Commonly known as:  COREG Take 1 tablet (6.25 mg total) by mouth 2 (two) times daily with a meal. What changed:  medication strength  how much to take   pantoprazole 40 MG tablet Commonly known as:  PROTONIX take 1 tablet by  mouth once daily   Potassium Chloride ER 20 MEQ Tbcr Take 20 mEq by mouth daily. Start taking on:  09/04/2015 What changed:  when to take this   QUEtiapine 400 MG tablet Commonly known as:  SEROQUEL Take 800 mg by mouth at bedtime.   rivaroxaban 20 MG Tabs tablet Commonly known as:  XARELTO Take 1 tablet (20 mg total) by mouth daily.   spironolactone 25 MG tablet Commonly known as:  ALDACTONE Take 1 tablet (25 mg total) by mouth daily.   torsemide 20 MG tablet Commonly known as:  DEMADEX Take 3 tablets (60 mg total) by mouth daily. Start taking on:  09/04/2015 What changed:  how much to take  when to take this       Disposition   The patient will be discharged in stable condition to home. Discharge Instructions    (HEART FAILURE PATIENTS) Call MD:  Anytime you have any of the following symptoms: 1) 3 pound weight gain in 24 hours or 5 pounds in 1 week 2) shortness of breath, with or without a dry hacking cough 3) swelling in the hands, feet or stomach 4) if you have to sleep on extra pillows at night in order to breathe.    Complete by:  As directed   Diet - low sodium heart healthy    Complete by:  As directed   Heart Failure patients record your daily weight using the same scale at the same time of day    Complete by:  As directed   Increase activity slowly    Complete by:  As directed     Follow-up Information    [facility] .   Why:  They will do your home health care at your home Contact information: [address] [place] [address] [phone]        Amy Clegg, NP Follow up on 09/11/2015.   Specialty:  Cardiology Why:  Heart Failure Clinic at 11:40  Contact information: [address] [address] [phone]             Duration of Discharge Encounter: Greater than 35 minutes   Signed, Amy Clegg NP-C  09/03/2015, 11:16 AM  Patient seen and examined with Darrick Grinder, NP. We discussed all aspects of the  encounter. I agree with the assessment and plan as stated above.   He is improved with diuresis. RHC results reviewed with him several times. [place] for d/c today and f/u in HF Clinic. Reinforced need for daily weights and  reviewed use of sliding scale diuretics.  Bensimhon, Daniel,MD 12:40 AM

## 2015-09-03 NOTE — Progress Notes (Signed)
Advanced Heart Failure Rounding Note   Subjective:    Yesterday RHC with stable volume status and slightly low cardiac output.   Denies SOB/Orthopnea   RHC 09/02/2015 RA = 7 RV = 35/10 PA = 37/15 (25) PCW = 10 Fick cardiac output/index = 4.3/1.7 PVR = 3.5 WU Ao sat = 95% PA sat = 58%, 58%  Assessment: 1. Volume status looks good. 2. Cardiac output depressed   Objective:   Weight Range:  Vital Signs:   Temp:  [97.4 F (36.3 C)-97.8 F (36.6 C)] 97.8 F (36.6 C) (08/08 0443) Pulse Rate:  [64-69] 64 (08/08 0443) Resp:  [18] 18 (08/08 0443) BP: (104-109)/(51-76) 109/76 (08/08 0443) SpO2:  [97 %] 97 % (08/08 0443) Weight:  [272 lb 11 oz (123.7 kg)] 272 lb 11 oz (123.7 kg) (08/08 0443) Last BM Date: 08/31/15  Weight change: Filed Weights   09/01/15 0510 09/02/15 0500 09/03/15 0443  Weight: 277 lb 6.4 oz (125.8 kg) 277 lb 4.8 oz (125.8 kg) 272 lb 11 oz (123.7 kg)    Intake/Output:   Intake/Output Summary (Last 24 hours) at 09/03/15 1100 Last data filed at 09/03/15 1003  Gross per 24 hour  Intake              220 ml  Output             1700 ml  Net            -1480 ml     Physical Exam: General:  Fatigued appearing No resp difficulty. Sitting in the chair HEENT: normal Neck: supple. JVP thick neck hard to see  Carotids 2+ bilat; no bruits. No lymphadenopathy or thryomegaly appreciated. Cor: PMI nondisplaced. Regular rate & rhythm. No rubs, gallops or murmurs. Lungs: clear with mildly decreased BS throughout Abdomen: soft, nontender, nondistended. No hepatosplenomegaly. No bruits or masses. Good bowel sounds. Extremities: no cyanosis, clubbing, rash, RLE and LLE 2+ edema. RLE wound  Neuro: alert & orientedx3, cranial nerves grossly intact. moves all 4 extremities w/o difficulty. Affect pleasant  Telemetry: Sinus Rhythm 70s .   Labs: Basic Metabolic Panel:  Recent Labs Lab 08/28/15 1506  08/30/15 0417 08/31/15 0240 09/01/15 0549 09/02/15 0411  09/03/15 0329  NA 137  < > 136 133* 134* 133* 134*  K 4.7  < > 4.1 4.3 4.0 4.4 4.7  CL 108  < > 98* 97* 95* 98* 100*  CO2 22  < > 29 26 28 26 28   GLUCOSE 91  < > 104* 126* 110* 129* 128*  BUN 13  < > 22* 31* 33* 27* 23*  CREATININE 0.97  < > 1.39* 1.62* 1.64* 1.37* 1.32*  CALCIUM 9.9  < > 10.5* 10.3 10.5* 10.4* 10.5*  MG 2.1  --   --   --   --   --   --   < > = values in this interval not displayed.  Liver Function Tests:  Recent Labs Lab 08/28/15 1506  AST 31  ALT 27  ALKPHOS 74  BILITOT 0.5  PROT 6.9  ALBUMIN 3.7   No results for input(s): LIPASE, AMYLASE in the last 168 hours. No results for input(s): AMMONIA in the last 168 hours.  CBC:  Recent Labs Lab 08/28/15 1506  WBC 4.9  NEUTROABS 2.1  HGB 15.2  HCT 47.4  MCV 91.5  PLT 184    Cardiac Enzymes: No results for input(s): CKTOTAL, CKMB, CKMBINDEX, TROPONINI in the last 168 hours.  BNP: BNP (last  3 results)  Recent Labs  02/12/15 1522 07/10/15 1255 08/28/15 1506  BNP 113.6* 107.2* 63.2    ProBNP (last 3 results) No results for input(s): PROBNP in the last 8760 hours.    Other results:  Imaging: No results found.   Medications:     Scheduled Medications: . aspirin EC  81 mg Oral Daily  . carvedilol  6.25 mg Oral BID WC  . pantoprazole  40 mg Oral Daily  . QUEtiapine  800 mg Oral QHS  . rivaroxaban  20 mg Oral Q supper  . sodium chloride flush  3 mL Intravenous Q12H  . sodium chloride flush  3 mL Intravenous Q12H  . sodium chloride flush  3 mL Intravenous Q12H  . spironolactone  25 mg Oral Daily    Infusions:    PRN Medications: sodium chloride, sodium chloride, sodium chloride, acetaminophen, ondansetron (ZOFRAN) IV, sodium chloride flush, sodium chloride flush, sodium chloride flush   Assessment/Plan    1) Acute on chronic combined systolic/diastolic HF: EF A999333, grade II DD (10/2012) now decreased to 20-25% (05/2015) with decreased diastolic compliance, LAE, and RV  severely reduced. Had [place] 2014 with minimal CAD.  2) HTN:BP ok today 3) PAF: Maintaining In NSR . On Xarelto  4) ETOH abuse 5) AKI  6) ?OSA - with heavy snoring and no sleep study.  6) DMII- Start sliding scale. hgb A1C 6.1  7) LE Wound - wound care seeingI  8) Hypotension- resolved.   Volume status stable Restart reduced of torsemide on 8/9. He will start torsemide 60 mg daily. Prior to admit he was on torsemide 80 mg twice daily. Renal function ok. Continue 25 mg spiro daily. Hold on losartan.   Follow up in HF clinic set up. HH ordered.   Home today.   Length of Stay: [place] NP-C  09/03/2015, 11:00 AM Advanced Heart Failure Team Pager [phone] (M-F; 7a - 4p)  Please contact [facility] for night-coverage after hours (4p -7a ) and weekends on [email or web address]  Patient seen and examined with Darrick Grinder, NP. We discussed all aspects of the encounter. I agree with the assessment and plan as stated above.   RHC reviewed with him. Volume status ok but output marginal. Not many options for him. Ok to go home today with medication changes as above. Will need to watch volume status closely. Suspect we will have to uptitrate torsemide in Clinic.   Bensimhon, Daniel,MD 10:09 PM

## 2015-09-10 ENCOUNTER — Encounter: Payer: Self-pay | Admitting: Internal Medicine

## 2015-09-10 ENCOUNTER — Ambulatory Visit (INDEPENDENT_AMBULATORY_CARE_PROVIDER_SITE_OTHER): Payer: Medicare Other | Admitting: Internal Medicine

## 2015-09-10 ENCOUNTER — Encounter (INDEPENDENT_AMBULATORY_CARE_PROVIDER_SITE_OTHER): Payer: Self-pay

## 2015-09-10 VITALS — BP 152/80 | HR 62 | Temp 97.8°F | Ht 72.0 in | Wt 282.7 lb

## 2015-09-10 DIAGNOSIS — M545 Low back pain: Secondary | ICD-10-CM | POA: Diagnosis not present

## 2015-09-10 DIAGNOSIS — L602 Onychogryphosis: Secondary | ICD-10-CM | POA: Insufficient documentation

## 2015-09-10 DIAGNOSIS — I5020 Unspecified systolic (congestive) heart failure: Secondary | ICD-10-CM

## 2015-09-10 DIAGNOSIS — G8929 Other chronic pain: Secondary | ICD-10-CM | POA: Diagnosis not present

## 2015-09-10 DIAGNOSIS — M549 Dorsalgia, unspecified: Secondary | ICD-10-CM

## 2015-09-10 DIAGNOSIS — I5022 Chronic systolic (congestive) heart failure: Secondary | ICD-10-CM

## 2015-09-10 DIAGNOSIS — L608 Other nail disorders: Secondary | ICD-10-CM | POA: Diagnosis not present

## 2015-09-10 MED ORDER — TRAMADOL HCL 50 MG PO TABS: 50.0000 mg | ORAL_TABLET | Freq: Three times a day (TID) | ORAL | 0 refills | Status: DC | PRN

## 2015-09-10 NOTE — Assessment & Plan Note (Addendum)
Assessment:  Systolic congestive heart failure Patient was admitted on 8/2 for CHF exacerbation and was diuresed with IV lasix.  He was discharged with Torsemide 60mg  daily and spironolactone 25mg  daily.  His Ace was stopped due to elevated creatinine.  He admits to taking his diuretics as prescribed.  Today he does not endorse SOB, PND or orthopnea.  He states his leg swelling is less than usual.  He does have some SOB on exertion.  He is up 10 lbs since discharge on 8/8.  He has an appointment with the [facility] tomorrow 8/16.  Plan - Because patient has a scheduled follow up with Cardiology tomorrow and he is asymptomatic I will not increase his diuretic regimen today.  - Told to make an appointment with his primary care in the next 4 weeks

## 2015-09-10 NOTE — Assessment & Plan Note (Addendum)
Assessment:  Overgrown toenail Approximately 3 inch toenail of left 1st toe.  He states it has been bothering him for a while.  He tried to go [facility] but stated they could not see him without a referral.  Plan -[facility]iatry referral [facility]

## 2015-09-10 NOTE — Patient Instructions (Addendum)
[name] [name]  Please keep your appointment for August 16th at 11:40 with Cardiology A referral has been made to the [facility] referral has also been made to a pain clinic for your chronic back pain In the meantime you have been given a Tramadol prescription and you are to follow up with your primary care doctor Nischal Narendra,MD to discuss your chronic back pain issues  Thank you!

## 2015-09-10 NOTE — Progress Notes (Signed)
CC: Hospital follow up for acute CHF exacerbation   HPI:  [name] is a 60 y.o.male with PMHx of Combined systolic and diastolic congestive heart failure and chronic back pain that presents to [facility] for hospital follow for acute CHF exacerbation, discharged on 8/8.  He currently denies any orthopnea, PND or SOB at rest.  He does endorse some SOB on exertion.  His weight since discharge has increased by 10 lbs.  He states he is compliant with his torsemide 60mg  and spironolactone 25mg  daily.  He reports having an appointment with Heart Failure Clinic at 11:40 tomorrow 8/16.  In addition he would like a podiatry referral for an overgrown toe nail that has been bothering him for some time.  He also states he has chronic back pain and was dismissed from his pain clinic over a year ago for positive cocaine UDS.  He was scheduled for back surgery but was canceled due to unstable medical conditions.  He states he needs help with managing his pain.   Past Medical History:  Diagnosis Date  . Atrial fibrillation ([place])   . Cancer [facility])    Prostate cancer-bx. 3 weeks ago  . Cataract   . Chronic combined systolic and diastolic CHF (congestive heart failure) (HCC)    a) EF 40-45% per 2D echo (02/2012) with grade 1 DD b)  NICM c) RHC (04/2012): RA: 4, RV 45/3/4, PA 42/9 (24), PCWP 14, Fick CO/CI: 5.2 /2.2, PVR 1.9 WU, PA 62% and 64% d) ECHO (10/2012) EF 40-45%, grade II DD, RV nl  . CKD (chronic kidney disease) stage 2, GFR 60-89 ml/min    BL SCr approximately 1-1.3  . Colitis 05/2009   History of colitis of ascending colon noted on CT abd/pelvis (05/2009), with interval resolution with subsequent CT  . Continuous chronic alcoholism ([place])   . Degenerative lumbar spinal stenosis    s/p L2-3, L3-4, L4-5 laminectomy partial facetectomy, and bilateral foraminotomy  . Diabetes mellitus without complication ([place])    Type II  . Dysrhythmia    A. Fib  . Family history of adverse reaction to anesthesia     "sister, can't go to sleep"  . GERD (gastroesophageal reflux disease)   . H/O cocaine abuse   . Hepatic steatosis    suspected 2/2 alcohol abuse  . Hepatitis C   . History of pancreatitis 01/2011   Admission for acute pancreatitis presumed 2/2 ongoing alcohol abuse- and hypertriglyceridemia  . History of pneumonia   . HIT (heparin-induced thrombocytopenia) ([place])   . Hyperlipidemia   . Hypertension    uncontrolled with medication noncompliance  . Hypertriglyceridemia   . NICM (nonischemic cardiomyopathy) ([place])    a. LHC (04/2012): nl arteries  . PFO (patent foramen ovale) 01/2012   with right to left shunt, noted per TEE in evaluation for source of embolic stroke in [id number]  . Rhabdomyolysis 02/22/2012   H/O rhabdomyolysis in 01/2012 that was idiopathic, cause never identified  . Schizophrenia, schizo-affective ([place])   . Shortness of breath dyspnea   . Splenic cyst   . Stroke ([place]) 01/2012   Small cerebellar infarcts right greater than left as well as questionable acute left external capsule and caudate nuclear punctate lacunar infarcts noted per MRI (01/2012) - presumed to be embolic likely source PFO with right to left shunt (noted per TEE 01/ 2014)    Review of Systems:  Review of Systems  Respiratory: Negative for shortness of breath and wheezing.   Cardiovascular: Negative for  chest pain, palpitations and PND.  Gastrointestinal: Negative for abdominal pain.  Musculoskeletal: Positive for back pain.  Neurological: Negative for weakness.     Physical Exam:  Vitals:   09/10/15 1029  BP: (!) 152/80  Pulse: 62  Temp: 97.8 F (36.6 C)  TempSrc: Oral  SpO2: 97%  Weight: 282 lb 11.2 oz (128.2 kg)  Height: 6' (1.829 m)   Physical Exam  Constitutional: He is well-developed, well-nourished, and in no distress.  Cardiovascular: Normal rate and regular rhythm.   Pulmonary/Chest: Effort normal and breath sounds normal.  CTA bilaterally   Abdominal: Soft. There is no  tenderness.  Musculoskeletal:  1+ pitting edema bilaterally in lower extremities   Skin: Skin is warm and dry.  Left big toe ~3in toenail, no erythema or drainage    Assessment & Plan:   See encounters tab for problem based medical decision making.   Patient seen with Dr. Eppie Gibson

## 2015-09-10 NOTE — Patient Outreach (Signed)
Patient triggered RED on EMMI Heart Failure dashboard, notification sent to:  Enzo Montgomery, RN.  Thank you, Alycia Rossetti

## 2015-09-10 NOTE — Progress Notes (Signed)
I saw and evaluated the patient. I personally confirmed the key portions of Dr. Jodene Nam history and exam and reviewed pertinent patient test results. The assessment, diagnosis, and plan were formulated together and I agree with the documentation in the resident's note.  We spoke with Dr. Dareen Piano and he had no reservations about [place] starting the tramadol as needed for his back pain since it had been effective in the past and Mr. Blocker has contraindications to both narcotics (cocaine use) and NSAIDs (symptomatic heart failure).  He was given a 1 month supply and renewal is dependent upon its success at helping him achieve his goal of increased function.

## 2015-09-10 NOTE — Assessment & Plan Note (Addendum)
Assessment:  Chronic back pain Patient has a history of lumbar fusion in 04/2011 and was set up for a left sided lumbar 2-3 lateral interbody fusion with instrumentation and allografts on 06/06/15 However, surgery was canceled because at surgery clearance visit patient was in decompensated heart failure.  Patient's pain was being managed at a pain clinic (over a year ago) but was dismissed for positive UDS for cocaine.    Plan - Refer patient to pain clinic - Set up appointment with current primary care physician to address chronic back issues and possibility of back surgery in the future - Tramadol 50mg  Q8 PRN (1 month)

## 2015-09-11 ENCOUNTER — Ambulatory Visit (HOSPITAL_COMMUNITY)
Admit: 2015-09-11 | Discharge: 2015-09-11 | Disposition: A | Discharge status: Home / Self Care | Payer: Medicare Other | Source: Ambulatory Visit | Attending: Cardiology | Admitting: Cardiology

## 2015-09-11 ENCOUNTER — Other Ambulatory Visit: Payer: Self-pay

## 2015-09-11 VITALS — BP 142/104 | HR 83 | Wt 275.0 lb

## 2015-09-11 DIAGNOSIS — Z8546 Personal history of malignant neoplasm of prostate: Secondary | ICD-10-CM | POA: Diagnosis not present

## 2015-09-11 DIAGNOSIS — Z8673 Personal history of transient ischemic attack (TIA), and cerebral infarction without residual deficits: Secondary | ICD-10-CM | POA: Diagnosis not present

## 2015-09-11 DIAGNOSIS — Z888 Allergy status to other drugs, medicaments and biological substances status: Secondary | ICD-10-CM | POA: Insufficient documentation

## 2015-09-11 DIAGNOSIS — Z79899 Other long term (current) drug therapy: Secondary | ICD-10-CM | POA: Insufficient documentation

## 2015-09-11 DIAGNOSIS — K219 Gastro-esophageal reflux disease without esophagitis: Secondary | ICD-10-CM | POA: Diagnosis not present

## 2015-09-11 DIAGNOSIS — Z9114 Patient's other noncompliance with medication regimen: Secondary | ICD-10-CM | POA: Diagnosis not present

## 2015-09-11 DIAGNOSIS — Z7901 Long term (current) use of anticoagulants: Secondary | ICD-10-CM | POA: Insufficient documentation

## 2015-09-11 DIAGNOSIS — I480 Paroxysmal atrial fibrillation: Secondary | ICD-10-CM | POA: Insufficient documentation

## 2015-09-11 DIAGNOSIS — E1122 Type 2 diabetes mellitus with diabetic chronic kidney disease: Secondary | ICD-10-CM | POA: Diagnosis not present

## 2015-09-11 DIAGNOSIS — R0683 Snoring: Secondary | ICD-10-CM | POA: Diagnosis not present

## 2015-09-11 DIAGNOSIS — I130 Hypertensive heart and chronic kidney disease with heart failure and stage 1 through stage 4 chronic kidney disease, or unspecified chronic kidney disease: Secondary | ICD-10-CM | POA: Diagnosis not present

## 2015-09-11 DIAGNOSIS — E781 Pure hyperglyceridemia: Secondary | ICD-10-CM | POA: Insufficient documentation

## 2015-09-11 DIAGNOSIS — Z87891 Personal history of nicotine dependence: Secondary | ICD-10-CM | POA: Insufficient documentation

## 2015-09-11 DIAGNOSIS — Z8249 Family history of ischemic heart disease and other diseases of the circulatory system: Secondary | ICD-10-CM | POA: Insufficient documentation

## 2015-09-11 DIAGNOSIS — I5042 Chronic combined systolic (congestive) and diastolic (congestive) heart failure: Secondary | ICD-10-CM | POA: Insufficient documentation

## 2015-09-11 DIAGNOSIS — I428 Other cardiomyopathies: Secondary | ICD-10-CM | POA: Diagnosis not present

## 2015-09-11 DIAGNOSIS — I509 Heart failure, unspecified: Secondary | ICD-10-CM | POA: Diagnosis not present

## 2015-09-11 DIAGNOSIS — I5020 Unspecified systolic (congestive) heart failure: Secondary | ICD-10-CM

## 2015-09-11 DIAGNOSIS — F1010 Alcohol abuse, uncomplicated: Secondary | ICD-10-CM | POA: Insufficient documentation

## 2015-09-11 DIAGNOSIS — N182 Chronic kidney disease, stage 2 (mild): Secondary | ICD-10-CM | POA: Diagnosis not present

## 2015-09-11 LAB — BASIC METABOLIC PANEL
ANION GAP: 7 (ref 5–15)
BUN: 23 mg/dL — ABNORMAL HIGH (ref 6–20)
CALCIUM: 10.3 mg/dL (ref 8.9–10.3)
CO2: 25 mmol/L (ref 22–32)
CREATININE: 1.29 mg/dL — AB (ref 0.61–1.24)
Chloride: 106 mmol/L (ref 101–111)
GFR, EST NON AFRICAN AMERICAN: 58 mL/min — AB (ref >60)
GLUCOSE: 113 mg/dL — AB (ref 65–99)
Potassium: 4.1 mmol/L (ref 3.5–5.1)
Sodium: 138 mmol/L (ref 135–145)

## 2015-09-11 LAB — BRAIN NATRIURETIC PEPTIDE: B Natriuretic Peptide: 58 pg/mL (ref 0.0–100.0)

## 2015-09-11 MED ORDER — SACUBITRIL-VALSARTAN 24-26 MG PO TABS: 1.0000 | ORAL_TABLET | Freq: Two times a day (BID) | ORAL | 6 refills | Status: DC

## 2015-09-11 NOTE — Progress Notes (Signed)
Advanced Heart Failure Medication Review by a Pharmacist  Does the patient  feel that his/her medications are working for him/her?  yes  Has the patient been experiencing any side effects to the medications prescribed?  no  Does the patient measure his/her own blood pressure or blood glucose at home?  no   Does the patient have any problems obtaining medications due to transportation or finances?   no  Understanding of regimen: fair Understanding of indications: fair Potential of compliance: fair Patient understands to avoid NSAIDs. Patient understands to avoid decongestants.  Issues to address at subsequent visits: Compliance   Pharmacist comments:  Mr. Mignogna is a pleasant 60 yo M presenting with his medication bottles. He reports better compliance with his regimen since his last discharge but admits to poor compliance prior to this. I did notice that he is taking carvedilol 25 mg BID instead of recently prescribed 6.25 mg BID dose. Most of the medication bottles he brought were filled in May of 2017. We discussed the importance of compliance and he verbalized understanding.   Ruta Hinds. Velva Harman, PharmD, BCPS, CPP Clinical Pharmacist Pager: [phone] Phone: [phone] 09/11/2015 12:00 PM      Time with patient: 10 minutes Preparation and documentation time: 2 minutes Total time: 12 minutes

## 2015-09-11 NOTE — Progress Notes (Signed)
Patient ID: [name] [name], male   DOB: [birth year], 60 y.o.   MRN: [id number]    Advanced Heart Failure Clinic Note   Primary Care: Internal Medicine clinic, Dr. Silverio [name] Primary Cardiologist: Dr. Doylene [name]  GI: Dr [name] [name].   HPI: [name] is a 6 y.o. 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 Had history of acute renal failure in setting of rhabdo requiring short term HD.    Admitted with new onset HF in April 2014.   He was diuresed and discharged home.  Discharge weight 256 pounds. Echo at the time showed EF 76-81%, grade 1 diastolic dysfunction.    Admitted to [facility] 5/30 through 06/27/12 with GI bleed. EGD 06/25/12 multiple ulcers noted. Continued on protonix.   Admitted 8/2 through 09/03/15 with marked volume overload in the setting of medication compliance. Diuresed with IV lasix and transitioned to torsemide 60 mg daily. Hospital course complicated by elevated renal function.Clonidine was stopped on discharge and carvedilol was cut back to 6.25 mg twice a day. Discharge weight 272 pounds.   He presents today for post hospital follow up. Overall feeling better. Denies SOB/PND/Orthopnea. Weight at home 272-278 pounds. Taking all medications but hasnt had several medications refilled in several months. Has most medications. Taking full dose carvedilol instead of 6.25 mg twice and was still taking clonidine and this was stopped. Says he can read. [facility] following. Paramedicine following.   ECHO 10/27/12 EF 40-45%  ECHO 06/04/15 LVEF 20-25%, decreased diastolic compliance, LAE, RV severely reduced.   [name] 08/2015 RA = 7 RV = 35/10 PA = 37/15 (25) PCW = 10 Fick cardiac output/index = 4.3/1.7 PVR = 3.5 WU Ao sat = 95% PA sat = 58%, 58% Assessment: 1. Volume status looks  good. 2. Cardiac output depressed  2014 LHC -  Left main: Normal LAD: Large vessel gives of 2 diagonals. Normal LCX: Very large vessel with 3 OMs. 20-30% plaque in midsection.  RCA: Dominant. Normal LV-gram done in the RAO projection: Ejection fraction = 35-40% global HK Assessment: 1. Minimal CAD  Labs 02/12/13 K 4.3 Creatinine 0.98          08/2013: K 3.9, creatinine 1.27, Mag 1.8, pro-BNP 392, Troponin 0.05          06/07/15: K 4.2, creatinine 1.21          07/10/2015: K 3.9 Creatinine 1.16           09/03/2015: K 4.7 Creatinine 1.32  SH: Quit smoking 9 years ago. Lives alone. Quit ETOH 09-22-2013 FH: Mom died from MI in her early 83s  Brother MI and deceased. Does not know about fathers health history.    Review of Systems: All pertinent positives and negatives as in HPI, otherwise negative.    Past Medical History:  Diagnosis Date  . Atrial fibrillation ([place])   . Cancer [facility])    Prostate cancer-bx. 3 weeks ago  . Cataract   . Chronic combined systolic and diastolic CHF (congestive heart failure) (HCC)    a) EF 40-45% per 2D echo (02/2012) with grade 1 DD b)  NICM c) RHC (04/2012): RA: 4, RV 45/3/4, PA 42/9 (24), PCWP 14, Fick CO/CI: 5.2 /2.2, PVR 1.9 WU, PA 62% and 64% d) ECHO (10/2012) EF 40-45%, grade II DD, RV nl  . CKD (chronic kidney disease)  stage 2, GFR 60-89 ml/min    BL SCr approximately 1-1.3  . Colitis 05/2009   History of colitis of ascending colon noted on CT abd/pelvis (05/2009), with interval resolution with subsequent CT  . Continuous chronic alcoholism ([place])   . Degenerative lumbar spinal stenosis    s/p L2-3, L3-4, L4-5 laminectomy partial facetectomy, and bilateral foraminotomy  . Diabetes mellitus without complication ([place])    Type II  . Dysrhythmia    A. Fib  . Family history of adverse reaction to anesthesia    "sister, can't go to sleep"  . GERD (gastroesophageal reflux disease)   . H/O cocaine abuse   . Hepatic steatosis    suspected 2/2  alcohol abuse  . Hepatitis C   . History of pancreatitis 01/2011   Admission for acute pancreatitis presumed 2/2 ongoing alcohol abuse- and hypertriglyceridemia  . History of pneumonia   . HIT (heparin-induced thrombocytopenia) ([place])   . Hyperlipidemia   . Hypertension    uncontrolled with medication noncompliance  . Hypertriglyceridemia   . NICM (nonischemic cardiomyopathy) ([place])    a. LHC (04/2012): nl arteries  . PFO (patent foramen ovale) 01/2012   with right to left shunt, noted per TEE in evaluation for source of embolic stroke in 04/8183  . Rhabdomyolysis 02/22/2012   H/O rhabdomyolysis in 01/2012 that was idiopathic, cause never identified  . Schizophrenia, schizo-affective ([place])   . Shortness of breath dyspnea   . Splenic cyst   . Stroke ([place]) 01/2012   Small cerebellar infarcts right greater than left as well as questionable acute left external capsule and caudate nuclear punctate lacunar infarcts noted per MRI (01/2012) - presumed to be embolic likely source PFO with right to left shunt (noted per TEE 01/ 2014)    Current Outpatient Prescriptions  Medication Sig Dispense Refill  . acetaminophen (TYLENOL) 500 MG tablet Take 1,000 mg by mouth every 8 (eight) hours as needed for mild pain.    . Blood Glucose Monitoring Suppl (ACCU-CHEK NANO SMARTVIEW) W/DEVICE KIT Use as instructed to test four times a day. DX: 250.92, insulin requiring 1 kit 0  . carvedilol (COREG) 25 MG tablet Take 25 mg by mouth 2 (two) times daily with a meal.    . cloNIDine (CATAPRES) 0.1 MG tablet Take 0.1 mg by mouth 2 (two) times daily.    . pantoprazole (PROTONIX) 40 MG tablet take 1 tablet by mouth once daily 30 tablet 1  . Potassium Chloride ER 20 MEQ TBCR Take 20 mEq by mouth daily. 30 tablet 6  . QUEtiapine (SEROQUEL) 400 MG tablet Take 800 mg by mouth at bedtime.    . rivaroxaban (XARELTO) 20 MG TABS tablet Take 1 tablet (20 mg total) by mouth daily. 90 tablet 3  . spironolactone (ALDACTONE) 25 MG  tablet Take 1 tablet (25 mg total) by mouth daily. 90 tablet 3  . torsemide (DEMADEX) 20 MG tablet Take 3 tablets (60 mg total) by mouth daily. 90 tablet 6  . traMADol (ULTRAM) 50 MG tablet Take 50 mg by mouth every 6 (six) hours as needed for moderate pain.     No current facility-administered medications for this encounter.     Allergies  Allergen Reactions  . Heparin Other (See Comments)    Documented HIT under problem list Pt states he's not allergic. " They told me not take it anymore"  . Thorazine [Chlorpromazine Hcl] Other (See Comments)    Body freezes up     Vitals:   09/11/15  1148  BP: (!) 142/104  Pulse: 83  SpO2: 94%  Weight: 275 lb (124.7 kg)   Wt Readings from Last 3 Encounters:  09/11/15 275 lb (124.7 kg)  09/10/15 282 lb 11.2 oz (128.2 kg)  09/03/15 272 lb 11 oz (123.7 kg)     PHYSICAL EXAM: General: NAD. Ambulated in the clinic without difficulty.   HEENT: normal Neck: supple. JVP 5-6 Carotids 2+ bilat; no bruits. No lymphadenopathy or thryomegaly appreciated. Cor: PMI nondisplaced. Regular rate & rhythm. No rubs, gallops, Very soft TR murmur. Lungs: clear Abdomen: Obese, soft, NT, ND, no HSM. No bruits or masses. +BS   Extremities: no cyanosis, clubbing, rash, R and LLE trace edema  Neuro: alert & oriented x 3, cranial nerves grossly intact. moves all 4 extremities w/o difficulty. Flat Affect    ASSESSMENT /PLAN 1) Chronic combined systolic/diastolic HF: EF 63-86%, grade II DD (10/2012) now decreased to 20-25%  (05/2015) with decreased diastolic compliance, LAE, and RV severely reduced. Had [place] 2014 with minimal CAD.  -NYHA II.  Volume status stable. Continue torsemide 60 mg daily.  Continue spironolactone 25 mg daily. Continue carvedilol 25 mg twice a day for now. May need to cut back to 12.5 mg twice a day but for now I will continue 25 mg twice a day.  Add entresto 24-26 mg twice a day. BMET today.  Plan to set up ECHO after next visit.   2) HTN:  Stop clonidine.  3) PAF: Regular pulse. Continue Xarelto 20 mg daily.  4) ETOH abuse 5) CKD stage II- check BMET today.    6) ?OSA- with heavy snoring and no sleep study.  7) DMII- Followed by PCP. 8) Medication Noncompliance- Encouraged medication compliance.      Follow up in 2 weeks for medication titration. Referred to Paramedicine and he was evaluated in the clinic today.   Amy Clegg NP-C  11:50 AM

## 2015-09-11 NOTE — Patient Instructions (Signed)
START Entresto 24/26mg : One tablet twice daily (10-12 hrs apart). Use 30 day free card.  Routine lab work today. Will notify you of abnormal results, otherwise no news is good news!  Follow up 2 weeks with Amy Clegg NP-C.  Do the following things EVERYDAY: 1) Weigh yourself in the morning before breakfast. Write it down and keep it in a log. 2) Take your medicines as prescribed 3) Eat low salt foods-Limit salt (sodium) to 2000 mg per day.  4) Stay as active as you can everyday 5) Limit all fluids for the day to less than 2 liters

## 2015-09-11 NOTE — Patient Outreach (Signed)
[place] [facility]) Care Management  09/11/2015  [name] [name] [birth year] [id number]       EMMI- HF RED ON EMMI ALERT Day # 6 Date: 09/09/15 Red Alert Reason: " Weighed themselves today? No   New/worsening problems? Yes"   Outreach attempt #1 to patient. Spoke with patient regarding red alerts. Patient reports that he is weighing daily but had not weighed at time of automated call. He states that on that day his weight was up and he noticed that he had more swelling which prompted his response to having problems. He reports that he did what he was supposed to do and took an extra diuretic which resolved issue. Patient reports that he has not had a chance this morning but weight was down yesterday. He voices that edema is improved and overall he is feeling better. Patient headed to cardiology f/u appt shortly. Reviewed with patient s/s of worsening condition and when to seek medical attention. He voiced understanding. He denies any issues with meds and transportation. No further RN CM needs or concerns at this time.   Plan: RN CM will notify [facility] administrative assistant of case closure status.  Enzo Montgomery, RN,BSN,CCM [place] Management Telephonic Care Management Coordinator Direct Phone: [phone] Toll Free: [phone] Fax: [phone]

## 2015-09-16 ENCOUNTER — Other Ambulatory Visit: Payer: Self-pay

## 2015-09-16 NOTE — Patient Outreach (Signed)
[facility]) Care Management  09/16/2015  [name] [name] 03-Jul-1955 [id number]     EMMI-HF RED ON EMMI ALERT Day #11 Date: 09/14/15 Red Alert Reason: "weighed themselves today? No"   Outreach attempt #1 to patient. No answer at present. RN CM left HIPAA compliant voicemail message along with contact info.    Plan: RN CM will make outreach attempt to patient within one business day if no response from patient.    Enzo Montgomery, RN,BSN,CCM [place] Management Telephonic Care Management Coordinator Direct Phone: [phone] Toll Free: [phone] Fax: [phone]

## 2015-09-17 ENCOUNTER — Other Ambulatory Visit: Payer: Self-pay

## 2015-09-17 NOTE — Patient Outreach (Signed)
[facility]) Care Management  09/17/2015  [name] [name] [birth year] [id number]      EMMI-HF RED ON EMMI ALERT Day #11 Date: 09/14/15 Red Alert Reason: "weighed themselves today? No"   Outreach attempt #2 to patient. No answer at present and unable to leave message.     Plan: RN CM will make outreach attempt to patient within one business day if no return call from patient.   Enzo Montgomery, RN,BSN,CCM [place] Management Telephonic Care Management Coordinator Direct Phone: [phone] Toll Free: [phone] Fax: [phone]

## 2015-09-18 ENCOUNTER — Other Ambulatory Visit: Payer: Self-pay

## 2015-09-18 NOTE — Patient Outreach (Signed)
[place] [facility]) Care Management  09/18/2015  [name] [name] Nov 03, 1955 [id number]     EMMI-HF RED ON EMMI ALERT Day #11 Date: 09/14/15 Red Alert Reason: "weighed themselves today? No"   Outreach attempt #3 to patient. Spoke with patient. He states that he has been weighing daily but sometimes automated calls come in prior to him having a chance to weight. Patient reports that weight this morning was 264 lbs. He denies any edema or SOB. Patient voices adhering to med regimen. Denies any other issues or concerns at this time.    Plan: RN CM will notify [facility] administrative assistant of case closure.   Enzo Montgomery, RN,BSN,CCM [place] Management Telephonic Care Management Coordinator Direct Phone: [phone] Toll Free: [phone] Fax: [phone]

## 2015-09-24 ENCOUNTER — Other Ambulatory Visit: Payer: Self-pay

## 2015-09-24 NOTE — Patient Outreach (Signed)
[facility]) Care Management  09/24/2015  W[place] GEORGIEV December 08, 1955 [id number]     EMMI-HF RED ON EMMI ALERT Day # 18 Date: 09/21/15 Red Alert Reason: "New/worsening problems? Yes   New/worsening SOB?Yes"    Outreach attempt #1 to patient. No answer at present. RN CM left HIPAA compliant voicemail message along with contact info.    Plan: RN CM will make outreach attempt to patient within one business day.   Enzo Montgomery, RN,BSN,CCM [place] Management Telephonic Care Management Coordinator Direct Phone: [phone] Toll Free: [phone] Fax: [phone]

## 2015-09-25 ENCOUNTER — Other Ambulatory Visit: Payer: Self-pay

## 2015-09-25 ENCOUNTER — Ambulatory Visit (HOSPITAL_COMMUNITY)
Admission: RE | Admit: 2015-09-25 | Discharge: 2015-09-25 | Disposition: A | Discharge status: Home / Self Care | Payer: Medicare Other | Source: Ambulatory Visit | Attending: Cardiology | Admitting: Cardiology

## 2015-09-25 VITALS — BP 148/86 | HR 74 | Wt 269.8 lb

## 2015-09-25 DIAGNOSIS — R0683 Snoring: Secondary | ICD-10-CM | POA: Diagnosis not present

## 2015-09-25 DIAGNOSIS — E785 Hyperlipidemia, unspecified: Secondary | ICD-10-CM | POA: Diagnosis not present

## 2015-09-25 DIAGNOSIS — I480 Paroxysmal atrial fibrillation: Secondary | ICD-10-CM | POA: Insufficient documentation

## 2015-09-25 DIAGNOSIS — I5042 Chronic combined systolic (congestive) and diastolic (congestive) heart failure: Secondary | ICD-10-CM | POA: Diagnosis not present

## 2015-09-25 DIAGNOSIS — Z87891 Personal history of nicotine dependence: Secondary | ICD-10-CM | POA: Diagnosis not present

## 2015-09-25 DIAGNOSIS — Z888 Allergy status to other drugs, medicaments and biological substances status: Secondary | ICD-10-CM | POA: Diagnosis not present

## 2015-09-25 DIAGNOSIS — N183 Chronic kidney disease, stage 3 (moderate): Secondary | ICD-10-CM | POA: Insufficient documentation

## 2015-09-25 DIAGNOSIS — Z8673 Personal history of transient ischemic attack (TIA), and cerebral infarction without residual deficits: Secondary | ICD-10-CM | POA: Insufficient documentation

## 2015-09-25 DIAGNOSIS — B1920 Unspecified viral hepatitis C without hepatic coma: Secondary | ICD-10-CM | POA: Insufficient documentation

## 2015-09-25 DIAGNOSIS — E781 Pure hyperglyceridemia: Secondary | ICD-10-CM | POA: Insufficient documentation

## 2015-09-25 DIAGNOSIS — Z7901 Long term (current) use of anticoagulants: Secondary | ICD-10-CM | POA: Diagnosis not present

## 2015-09-25 DIAGNOSIS — Z9114 Patient's other noncompliance with medication regimen: Secondary | ICD-10-CM | POA: Insufficient documentation

## 2015-09-25 DIAGNOSIS — E1122 Type 2 diabetes mellitus with diabetic chronic kidney disease: Secondary | ICD-10-CM | POA: Insufficient documentation

## 2015-09-25 DIAGNOSIS — C61 Malignant neoplasm of prostate: Secondary | ICD-10-CM | POA: Insufficient documentation

## 2015-09-25 DIAGNOSIS — Z8249 Family history of ischemic heart disease and other diseases of the circulatory system: Secondary | ICD-10-CM | POA: Insufficient documentation

## 2015-09-25 DIAGNOSIS — N182 Chronic kidney disease, stage 2 (mild): Secondary | ICD-10-CM | POA: Diagnosis not present

## 2015-09-25 DIAGNOSIS — F1010 Alcohol abuse, uncomplicated: Secondary | ICD-10-CM | POA: Insufficient documentation

## 2015-09-25 DIAGNOSIS — I130 Hypertensive heart and chronic kidney disease with heart failure and stage 1 through stage 4 chronic kidney disease, or unspecified chronic kidney disease: Secondary | ICD-10-CM | POA: Diagnosis not present

## 2015-09-25 DIAGNOSIS — F209 Schizophrenia, unspecified: Secondary | ICD-10-CM | POA: Insufficient documentation

## 2015-09-25 DIAGNOSIS — K219 Gastro-esophageal reflux disease without esophagitis: Secondary | ICD-10-CM | POA: Insufficient documentation

## 2015-09-25 DIAGNOSIS — Z79899 Other long term (current) drug therapy: Secondary | ICD-10-CM | POA: Insufficient documentation

## 2015-09-25 DIAGNOSIS — I10 Essential (primary) hypertension: Secondary | ICD-10-CM

## 2015-09-25 DIAGNOSIS — I428 Other cardiomyopathies: Secondary | ICD-10-CM | POA: Insufficient documentation

## 2015-09-25 MED ORDER — SACUBITRIL-VALSARTAN 49-51 MG PO TABS: 1.0000 | ORAL_TABLET | Freq: Two times a day (BID) | ORAL | 3 refills | Status: DC

## 2015-09-25 NOTE — Patient Instructions (Signed)
INCREASE Entresto to 49/51 mg: Take 1 tablet twice daily.  Follow up 2 weeks with Amy Clegg NP-C.  Do the following things EVERYDAY: 1) Weigh yourself in the morning before breakfast. Write it down and keep it in a log. 2) Take your medicines as prescribed 3) Eat low salt foods-Limit salt (sodium) to 2000 mg per day.  4) Stay as active as you can everyday 5) Limit all fluids for the day to less than 2 liters

## 2015-09-25 NOTE — Progress Notes (Signed)
Patient ID: [name] [name], male   DOB: 19-Jun-1955, 60 y.o.   MRN: [id number]    Advanced Heart Failure Clinic Note   Primary Care: Internal Medicine clinic, Dr. Silverio Decamp Primary Cardiologist: Dr. Doylene Canard  GI: Dr Ardis Hughs.   HPI: [name] [name] is a 64 y.o. 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 Had history of acute renal failure in setting of rhabdo requiring short term HD.    Admitted with new onset HF in April 2014.   He was diuresed and discharged home.  Discharge weight 256 pounds. Echo at the time showed EF 14-78%, grade 1 diastolic dysfunction.    Admitted to [facility] 5/30 through 06/27/12 with GI bleed. EGD 06/25/12 multiple ulcers noted. Continued on protonix.   Admitted 8/2 through 09/03/15 with marked volume overload in the setting of medication compliance. Diuresed with IV lasix and transitioned to torsemide 60 mg daily. Hospital course complicated by elevated renal function.Clonidine was stopped on discharge and carvedilol was cut back to 6.25 mg twice a day. Discharge weight 272 pounds.   He presents today for follow up. Last visit entresto was started. Had many medications errors last visit. Overall feeling much better. Denies SOB/PND/Orthopnea.  Weight at home 266-268 pounds. Taking all medications and he has them with him. Tries to follow low salt diet Not smoking or drinking.Says he likes [facility] phone calls. Followed by [facility]  Lives at home alone.   ECHO 10/27/12 EF 40-45%  ECHO 06/04/15 LVEF 20-25%, decreased diastolic compliance, LAE, RV severely reduced.   [place] 08/2015 RA = 7 RV = 35/10 PA = 37/15 (25) PCW = 10 Fick cardiac output/index = 4.3/1.7 PVR = 3.5 WU Ao sat = 95% PA sat = 58%, 58% Assessment: 1. Volume status looks good. 2. Cardiac output  depressed  2014 LHC -  Left main: Normal LAD: Large vessel gives of 2 diagonals. Normal LCX: Very large vessel with 3 OMs. 20-30% plaque in midsection.  RCA: Dominant. Normal LV-gram done in the RAO projection: Ejection fraction = 35-40% global HK Assessment: 1. Minimal CAD  Labs 02/12/13 K 4.3 Creatinine 0.98          08/2013: K 3.9, creatinine 1.27, Mag 1.8, pro-BNP 392, Troponin 0.05          06/07/15: K 4.2, creatinine 1.21          07/10/2015: K 3.9 Creatinine 1.16           09/03/2015: K 4.7 Creatinine 1.32          09/11/2015: K 4.1 Creatinine 1.29   SH: Quit smoking 9 years ago. Lives alone. Quit ETOH 10-04-2013 FH: Mom died from MI in her early 27s  Brother MI and deceased. Does not know about fathers health history.    Review of Systems: All pertinent positives and negatives as in HPI, otherwise negative.    Past Medical History:  Diagnosis Date  . Atrial fibrillation ([place])   . Cancer [facility])    Prostate cancer-bx. 3 weeks ago  . Cataract   . Chronic combined systolic and diastolic CHF (congestive heart failure) (HCC)    a) EF 40-45% per 2D echo (02/2012) with grade 1 DD b)  NICM c) RHC (04/2012): RA: 4, RV 45/3/4, PA 42/9 (24), PCWP 14, Fick CO/CI: 5.2 /2.2, PVR 1.9 WU, PA 62% and  64% d) ECHO (10/2012) EF 40-45%, grade II DD, RV nl  . CKD (chronic kidney disease) stage 2, GFR 60-89 ml/min    BL SCr approximately 1-1.3  . Colitis 05/2009   History of colitis of ascending colon noted on CT abd/pelvis (05/2009), with interval resolution with subsequent CT  . Continuous chronic alcoholism ([place])   . Degenerative lumbar spinal stenosis    s/p L2-3, L3-4, L4-5 laminectomy partial facetectomy, and bilateral foraminotomy  . Diabetes mellitus without complication ([place])    Type II  . Dysrhythmia    A. Fib  . Family history of adverse reaction to anesthesia    "sister, can't go to sleep"  . GERD (gastroesophageal reflux disease)   . H/O cocaine abuse   . Hepatic steatosis     suspected 2/2 alcohol abuse  . Hepatitis C   . History of pancreatitis 01/2011   Admission for acute pancreatitis presumed 2/2 ongoing alcohol abuse- and hypertriglyceridemia  . History of pneumonia   . HIT (heparin-induced thrombocytopenia) ([place])   . Hyperlipidemia   . Hypertension    uncontrolled with medication noncompliance  . Hypertriglyceridemia   . NICM (nonischemic cardiomyopathy) ([place])    a. LHC (04/2012): nl arteries  . PFO (patent foramen ovale) 01/2012   with right to left shunt, noted per TEE in evaluation for source of embolic stroke in 04/5807  . Rhabdomyolysis 02/22/2012   H/O rhabdomyolysis in 01/2012 that was idiopathic, cause never identified  . Schizophrenia, schizo-affective ([place])   . Shortness of breath dyspnea   . Splenic cyst   . Stroke ([place]) 01/2012   Small cerebellar infarcts right greater than left as well as questionable acute left external capsule and caudate nuclear punctate lacunar infarcts noted per MRI (01/2012) - presumed to be embolic likely source PFO with right to left shunt (noted per TEE 01/ 2014)    Current Outpatient Prescriptions  Medication Sig Dispense Refill  . acetaminophen (TYLENOL) 500 MG tablet Take 1,000 mg by mouth every 8 (eight) hours as needed for mild pain.    . Blood Glucose Monitoring Suppl (ACCU-CHEK NANO SMARTVIEW) W/DEVICE KIT Use as instructed to test four times a day. DX: 250.92, insulin requiring 1 kit 0  . carvedilol (COREG) 25 MG tablet Take 25 mg by mouth 2 (two) times daily with a meal.    . pantoprazole (PROTONIX) 40 MG tablet take 1 tablet by mouth once daily 30 tablet 1  . Potassium Chloride ER 20 MEQ TBCR Take 20 mEq by mouth daily. 30 tablet 6  . QUEtiapine (SEROQUEL) 400 MG tablet Take 800 mg by mouth at bedtime.    . rivaroxaban (XARELTO) 20 MG TABS tablet Take 1 tablet (20 mg total) by mouth daily. 90 tablet 3  . sacubitril-valsartan (ENTRESTO) 24-26 MG Take 1 tablet by mouth 2 (two) times daily. 60 tablet 6  .  spironolactone (ALDACTONE) 25 MG tablet Take 1 tablet (25 mg total) by mouth daily. 90 tablet 3  . torsemide (DEMADEX) 20 MG tablet Take 3 tablets (60 mg total) by mouth daily. 90 tablet 6  . traMADol (ULTRAM) 50 MG tablet Take 50 mg by mouth every 6 (six) hours as needed for moderate pain.     No current facility-administered medications for this encounter.     Allergies  Allergen Reactions  . Heparin Other (See Comments)    Documented HIT under problem list Pt states he's not allergic. " They told me not take it anymore"  . Thorazine [Chlorpromazine Hcl]  Other (See Comments)    Body freezes up     Vitals:   09/25/15 1454  BP: (!) 148/86  BP Location: Left Arm  Patient Position: Sitting  Cuff Size: Large  Pulse: 74  SpO2: 94%  Weight: 269 lb 12.8 oz (122.4 kg)   Wt Readings from Last 3 Encounters:  09/25/15 269 lb 12.8 oz (122.4 kg)  09/11/15 275 lb (124.7 kg)  09/10/15 282 lb 11.2 oz (128.2 kg)     PHYSICAL EXAM: General: NAD. Ambulated in the clinic without difficulty.   HEENT: normal Neck: supple. JVP 5-6 Carotids 2+ bilat; no bruits. No lymphadenopathy or thryomegaly appreciated. Cor: PMI nondisplaced. Regular rate & rhythm. No rubs, gallops, Very soft TR murmur. Lungs: clear Abdomen: Obese, soft, NT, ND, no HSM. No bruits or masses. +BS   Extremities: no cyanosis, clubbing, rash, R and LLE trace edema  Neuro: alert & oriented x 3, cranial nerves grossly intact. moves all 4 extremities w/o difficulty. Flat Affect    ASSESSMENT /PLAN 1) Chronic combined systolic/diastolic HF: EF 53-91%, grade II DD (10/2012) now decreased to 20-25%  (05/2015) with decreased diastolic compliance, LAE, and RV severely reduced. Had [place] 2014 with minimal CAD.  -NYHA II.  Volume status stable. Continue torsemide 60 mg daily.  Continue spironolactone 25 mg daily. Continue carvedilol 25 mg twice a day for now. .  Increase entresto to 49-51 mg twice a day.  BMET next visit.  Plan to set  up ECHO after next visit.  Reinforced medication compliance.   2) HTN: Elevated. Increase entresto as above.   3) PAF: Regular pulse. Continue Xarelto 20 mg daily.  4) ETOH abuse- Has stopped drinking .  5) CKD stage II- check BMET today.    6) ?OSA- with heavy snoring. Set up sleep study next visit.  and no sleep study.  7) DMII- Followed by PCP. 8) Medication Noncompliance- Encouraged medication compliance.    Complaint with medications.   Follow up in 2 weeks for medication titration. Set up ECHO at that time. Paramedicine to follow.   Kaycie Pegues NP-C  2:56 PM

## 2015-09-25 NOTE — Patient Outreach (Signed)
[facility]) Care Management  09/25/2015  [name] [name] Dec 27, 1955 [id number]     EMMI-HF RED ON EMMI ALERT Day # 18 Date: 09/21/15 Red Alert Reason: "New/worsening problems? Yes   New/worsening SOB?Yes"    Outreach attempt #2 to patient. Spoke with patient. Reviewed and addressed red alert. Patient reports that over the weekend he was not feeling too well. He voices he was experiencing a little more SOB than normal. He states that it has since resolved. Patient denies any issues with SOB, edema and fluid retention today. He voices that he knows s/s of worsening condition and when to seek medical attention. He states that he has appt later today and will discuss how he felt over the weekend with MD. Patient denies any further RN CM needs or concerns at this time.    Plan: RN CM will notify [facility] administrative assistant of case closure status.  Enzo Montgomery, RN,BSN,CCM [place] [facility] Telephonic Care Management Coordinator Direct Phone: [phone] Toll Free: [phone] Fax: [phone]

## 2015-10-03 DIAGNOSIS — Z79899 Other long term (current) drug therapy: Secondary | ICD-10-CM | POA: Diagnosis not present

## 2015-10-03 DIAGNOSIS — L03032 Cellulitis of left toe: Secondary | ICD-10-CM | POA: Diagnosis not present

## 2015-10-03 DIAGNOSIS — B351 Tinea unguium: Secondary | ICD-10-CM | POA: Diagnosis not present

## 2015-10-03 DIAGNOSIS — M792 Neuralgia and neuritis, unspecified: Secondary | ICD-10-CM | POA: Diagnosis not present

## 2015-10-03 DIAGNOSIS — E119 Type 2 diabetes mellitus without complications: Secondary | ICD-10-CM | POA: Diagnosis not present

## 2015-10-09 ENCOUNTER — Encounter (HOSPITAL_COMMUNITY): Payer: Self-pay

## 2015-10-11 DIAGNOSIS — M545 Low back pain: Secondary | ICD-10-CM | POA: Diagnosis not present

## 2015-10-15 ENCOUNTER — Ambulatory Visit: Payer: Self-pay | Admitting: Internal Medicine

## 2015-10-21 ENCOUNTER — Other Ambulatory Visit: Payer: Self-pay | Admitting: Pharmacist

## 2015-10-21 NOTE — Patient Outreach (Signed)
Outreach call to [employer] regarding his request for follow up from the [facility] Medication Adherence Campaign. Called and spoke with patient. HIPAA identifiers verified and verbal consent received.  Mr. Gathright reports that he is currently taking his carvedilol, Entresto, spironolactone and torsemide as directed. Reports that a couple of months ago he had not been taking his medications consistently. However, reports that he has since gotten back to taking his medications everyday and denies missing any doses. Reports that he has a pillbox, but does not use it. Reports that he keeps all of his bottles together and prefers to take his medications directly from the bottles. Patient denies any medication side effects or difficulty with affording his medications.  Mr. Laduca reports that he is taking and recording his weight daily. Denies any current signs of swelling. Reports that he did feel out of breath yesterday when going for a walk, but he attributed this to "being out of shape". Review with patient heart failure signs and symptoms to call his doctor about. Patient verbalizes understanding.  Mr. Dalton states that he has no further medication questions or concerns for me today. Provide patient with my phone number.  Harlow Asa, PharmD Clinical Pharmacist [place] [facility] [phone]

## 2015-10-25 DIAGNOSIS — G894 Chronic pain syndrome: Secondary | ICD-10-CM | POA: Diagnosis not present

## 2015-10-25 DIAGNOSIS — M545 Low back pain: Secondary | ICD-10-CM | POA: Diagnosis not present

## 2015-10-28 ENCOUNTER — Ambulatory Visit (HOSPITAL_COMMUNITY)
Admission: RE | Admit: 2015-10-28 | Discharge: 2015-10-28 | Disposition: A | Discharge status: Home / Self Care | Payer: Medicare Other | Source: Ambulatory Visit | Attending: Internal Medicine | Admitting: Internal Medicine

## 2015-10-28 VITALS — BP 104/88 | HR 74 | Wt 274.8 lb

## 2015-10-28 DIAGNOSIS — Z7901 Long term (current) use of anticoagulants: Secondary | ICD-10-CM | POA: Insufficient documentation

## 2015-10-28 DIAGNOSIS — I480 Paroxysmal atrial fibrillation: Secondary | ICD-10-CM | POA: Diagnosis not present

## 2015-10-28 DIAGNOSIS — Z79899 Other long term (current) drug therapy: Secondary | ICD-10-CM | POA: Insufficient documentation

## 2015-10-28 DIAGNOSIS — I4891 Unspecified atrial fibrillation: Secondary | ICD-10-CM | POA: Insufficient documentation

## 2015-10-28 DIAGNOSIS — I5023 Acute on chronic systolic (congestive) heart failure: Secondary | ICD-10-CM | POA: Diagnosis not present

## 2015-10-28 DIAGNOSIS — I130 Hypertensive heart and chronic kidney disease with heart failure and stage 1 through stage 4 chronic kidney disease, or unspecified chronic kidney disease: Secondary | ICD-10-CM | POA: Diagnosis not present

## 2015-10-28 DIAGNOSIS — E785 Hyperlipidemia, unspecified: Secondary | ICD-10-CM | POA: Diagnosis not present

## 2015-10-28 DIAGNOSIS — Z87891 Personal history of nicotine dependence: Secondary | ICD-10-CM | POA: Insufficient documentation

## 2015-10-28 DIAGNOSIS — R0683 Snoring: Secondary | ICD-10-CM | POA: Diagnosis not present

## 2015-10-28 DIAGNOSIS — E1122 Type 2 diabetes mellitus with diabetic chronic kidney disease: Secondary | ICD-10-CM | POA: Diagnosis not present

## 2015-10-28 DIAGNOSIS — Z8673 Personal history of transient ischemic attack (TIA), and cerebral infarction without residual deficits: Secondary | ICD-10-CM | POA: Diagnosis not present

## 2015-10-28 DIAGNOSIS — N182 Chronic kidney disease, stage 2 (mild): Secondary | ICD-10-CM | POA: Diagnosis not present

## 2015-10-28 DIAGNOSIS — F209 Schizophrenia, unspecified: Secondary | ICD-10-CM | POA: Insufficient documentation

## 2015-10-28 DIAGNOSIS — I5042 Chronic combined systolic (congestive) and diastolic (congestive) heart failure: Secondary | ICD-10-CM | POA: Diagnosis not present

## 2015-10-28 DIAGNOSIS — B1920 Unspecified viral hepatitis C without hepatic coma: Secondary | ICD-10-CM | POA: Diagnosis not present

## 2015-10-28 DIAGNOSIS — I2510 Atherosclerotic heart disease of native coronary artery without angina pectoris: Secondary | ICD-10-CM | POA: Insufficient documentation

## 2015-10-28 DIAGNOSIS — Z9189 Other specified personal risk factors, not elsewhere classified: Secondary | ICD-10-CM

## 2015-10-28 DIAGNOSIS — Z8546 Personal history of malignant neoplasm of prostate: Secondary | ICD-10-CM | POA: Insufficient documentation

## 2015-10-28 DIAGNOSIS — I158 Other secondary hypertension: Secondary | ICD-10-CM

## 2015-10-28 LAB — BASIC METABOLIC PANEL
ANION GAP: 7 (ref 5–15)
BUN: 23 mg/dL — AB (ref 6–20)
CALCIUM: 10 mg/dL (ref 8.9–10.3)
CO2: 23 mmol/L (ref 22–32)
Chloride: 106 mmol/L (ref 101–111)
Creatinine, Ser: 1.17 mg/dL (ref 0.61–1.24)
GFR calc Af Amer: >60 mL/min (ref >60)
GFR calc non Af Amer: >60 mL/min (ref >60)
GLUCOSE: 108 mg/dL — AB (ref 65–99)
Potassium: 4.4 mmol/L (ref 3.5–5.1)
Sodium: 136 mmol/L (ref 135–145)

## 2015-10-28 NOTE — Progress Notes (Signed)
Patient ID: [name] [name], male   DOB: October 03, 1955, 60 y.o.   MRN: [id number]    Advanced Heart Failure Clinic Note   Primary Care: Internal Medicine clinic, Dr. Silverio Decamp Primary Cardiologist: Dr. Doylene Canard  GI: Dr Ardis Hughs.  Primary HF: Dr. Haroldine Laws   HPI: [name] [name] is a 60 y.o. 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 Had history of acute renal failure in setting of rhabdo requiring short term HD.    Admitted with new onset HF in April 2014.   He was diuresed and discharged home.  Discharge weight 256 pounds. Echo at the time showed EF 17-00%, grade 1 diastolic dysfunction.    Admitted to [facility] 5/30 through 06/27/12 with GI bleed. EGD 06/25/12 multiple ulcers noted. Continued on protonix.   Admitted 8/2 through 09/03/15 with marked volume overload in the setting of medication compliance. Diuresed with IV lasix and transitioned to torsemide 60 mg daily. Hospital course complicated by elevated renal function.Clonidine was stopped on discharge and carvedilol was cut back to 6.25 mg twice a day. Discharge weight 272 pounds.   He presents today for regular follow up.  Last visit Entresto increased.  Weight at home 266-268 lbs still. Pees well on torsemide.  Denies any lightheadedness or dizziness. Denies CP. Tries to watch salt and fluids.  Taking all medications as directed and brought them into clinic today. [facility] continues to call.  Finished with [facility]. Denies any SOB. Can walk around 1.5 - 2 blocks without needing to stop.  No orthopnea, bendopnea, or PND. Not smoking or using alcohol.   EKG: NSR 61 bpm with 1st degree AV block with PACs  ECHO 10/27/12 EF 40-45%  ECHO 06/04/15 LVEF 20-25%, decreased diastolic compliance, LAE, RV severely reduced.   [place] 08/2015 RA = 7 RV = 35/10 PA =  37/15 (25) PCW = 10 Fick cardiac output/index = 4.3/1.7 PVR = 3.5 WU Ao sat = 95% PA sat = 58%, 58% Assessment: 1. Volume status looks good. 2. Cardiac output depressed  2014 LHC -  Left main: Normal LAD: Large vessel gives of 2 diagonals. Normal LCX: Very large vessel with 3 OMs. 20-30% plaque in midsection.  RCA: Dominant. Normal LV-gram done in the RAO projection: Ejection fraction = 35-40% global HK Assessment: 1. Minimal CAD  Labs 02/12/13 K 4.3 Creatinine 0.98          08/2013: K 3.9, creatinine 1.27, Mag 1.8, pro-BNP 392, Troponin 0.05          06/07/15: K 4.2, creatinine 1.21          07/10/2015: K 3.9 Creatinine 1.16           09/03/2015: K 4.7 Creatinine 1.32          09/11/2015: K 4.1 Creatinine 1.29   SH: Quit smoking 9 years ago. Lives alone. Quit ETOH October 09, 2013 FH: Mom died from MI in her early 20s  Brother MI and deceased. Does not know about fathers health history.    Review of Systems: All pertinent positives and negatives as in HPI, otherwise negative.    Past Medical History:  Diagnosis Date  . Atrial fibrillation ([place])   . Cancer [facility])    Prostate cancer-bx. 3 weeks ago  . Cataract   . Chronic combined systolic and diastolic CHF (congestive heart failure) ([place])  a) EF 40-45% per 2D echo (02/2012) with grade 1 DD b)  NICM c) RHC (04/2012): RA: 4, RV 45/3/4, PA 42/9 (24), PCWP 14, Fick CO/CI: 5.2 /2.2, PVR 1.9 WU, PA 62% and 64% d) ECHO (10/2012) EF 40-45%, grade II DD, RV nl  . CKD (chronic kidney disease) stage 2, GFR 60-89 ml/min    BL SCr approximately 1-1.3  . Colitis 05/2009   History of colitis of ascending colon noted on CT abd/pelvis (05/2009), with interval resolution with subsequent CT  . Continuous chronic alcoholism ([place])   . Degenerative lumbar spinal stenosis    s/p L2-3, L3-4, L4-5 laminectomy partial facetectomy, and bilateral foraminotomy  . Diabetes mellitus without complication ([place])    Type II  . Dysrhythmia    A. Fib  . Family  history of adverse reaction to anesthesia    "sister, can't go to sleep"  . GERD (gastroesophageal reflux disease)   . H/O cocaine abuse   . Hepatic steatosis    suspected 2/2 alcohol abuse  . Hepatitis C   . History of pancreatitis 01/2011   Admission for acute pancreatitis presumed 2/2 ongoing alcohol abuse- and hypertriglyceridemia  . History of pneumonia   . HIT (heparin-induced thrombocytopenia) ([place])   . Hyperlipidemia   . Hypertension    uncontrolled with medication noncompliance  . Hypertriglyceridemia   . NICM (nonischemic cardiomyopathy) ([place])    a. LHC (04/2012): nl arteries  . PFO (patent foramen ovale) 01/2012   with right to left shunt, noted per TEE in evaluation for source of embolic stroke in 05/9561  . Rhabdomyolysis 02/22/2012   H/O rhabdomyolysis in 01/2012 that was idiopathic, cause never identified  . Schizophrenia, schizo-affective ([place])   . Shortness of breath dyspnea   . Splenic cyst   . Stroke ([place]) 01/2012   Small cerebellar infarcts right greater than left as well as questionable acute left external capsule and caudate nuclear punctate lacunar infarcts noted per MRI (01/2012) - presumed to be embolic likely source PFO with right to left shunt (noted per TEE 01/ 2014)    Current Outpatient Prescriptions  Medication Sig Dispense Refill  . acetaminophen (TYLENOL) 500 MG tablet Take 1,000 mg by mouth every 8 (eight) hours as needed for mild pain.    . Blood Glucose Monitoring Suppl (ACCU-CHEK NANO SMARTVIEW) W/DEVICE KIT Use as instructed to test four times a day. DX: 250.92, insulin requiring 1 kit 0  . carvedilol (COREG) 6.25 MG tablet Take 6.25 mg by mouth 2 (two) times daily with a meal.    . HYDROcodone-acetaminophen (NORCO) 10-325 MG tablet Take 1 tablet by mouth every 8 (eight) hours as needed.    . pantoprazole (PROTONIX) 40 MG tablet take 1 tablet by mouth once daily 30 tablet 1  . Potassium Chloride ER 20 MEQ TBCR Take 20 mEq by mouth daily. 30 tablet  6  . QUEtiapine (SEROQUEL) 400 MG tablet Take 800 mg by mouth at bedtime.    . rivaroxaban (XARELTO) 20 MG TABS tablet Take 1 tablet (20 mg total) by mouth daily. 90 tablet 3  . sacubitril-valsartan (ENTRESTO) 49-51 MG Take 1 tablet by mouth 2 (two) times daily. 60 tablet 3  . spironolactone (ALDACTONE) 25 MG tablet Take 1 tablet (25 mg total) by mouth daily. 90 tablet 3  . terbinafine (LAMISIL) 250 MG tablet Take 250 mg by mouth daily.    Marland Kitchen torsemide (DEMADEX) 20 MG tablet Take 3 tablets (60 mg total) by mouth daily. 90 tablet 6  .  traMADol (ULTRAM) 50 MG tablet Take 50 mg by mouth every 6 (six) hours as needed for moderate pain.     No current facility-administered medications for this encounter.     Allergies  Allergen Reactions  . Heparin Other (See Comments)    Documented HIT under problem list Pt states he's not allergic. " They told me not take it anymore"  . Thorazine [Chlorpromazine Hcl] Other (See Comments)    Body freezes up     Vitals:   10/28/15 1409  BP: 104/88  BP Location: Left Arm  Patient Position: Sitting  Cuff Size: Normal  Pulse: 74  SpO2: 98%  Weight: 274 lb 12.8 oz (124.6 kg)   Wt Readings from Last 3 Encounters:  10/28/15 274 lb 12.8 oz (124.6 kg)  09/25/15 269 lb 12.8 oz (122.4 kg)  09/11/15 275 lb (124.7 kg)     PHYSICAL EXAM: General: NAD. Ambulated in the clinic without difficulty.   HEENT: normal Neck: supple. JVP 5-6 Carotids 2+ bilat; no bruits. No lymphadenopathy or thryomegaly appreciated. Cor: PMI nondisplaced. Regular rate & rhythm. No rubs, gallops, Very soft TR murmur. Lungs: clear Abdomen: Obese, soft, NT, ND, no HSM. No bruits or masses. +BS   Extremities: no cyanosis, clubbing, rash, R and LLE trace edema  Neuro: alert & oriented x 3, cranial nerves grossly intact. moves all 4 extremities w/o difficulty. Flat Affect   ASSESSMENT /PLAN 1) Chronic combined systolic/diastolic HF: EF 16-10%, grade II DD (10/2012) now decreased to  20-25%  (05/2015) with decreased diastolic compliance, LAE, and RV severely reduced. Had [place] 2014 with minimal CAD.  -NYHA II.  Volume status stable. Continue torsemide 60 mg daily.  Continue spironolactone 25 mg daily. - Continue carvedilol 25 mg BID - Continue Entresto 49-51 mg BID. BMET today.   - Will get Echo at next visit.  Reinforced medication compliance.   2) HTN - Stable on current regimen.  3) PAF:  - NSR by EKG.  - Continue Xarelto 20 mg daily.  4) ETOH abuse-  -Remains abstinent 5) CKD stage II- check BMET today.    6) ?OSA - Will refer for sleep study. 7) DMII- Followed by PCP. 8) Medication Noncompliance - Congratulated on continued medical compliance.    Follow up 6-8 weeks with Echo on MD side.  Continue paramedicine.   Shirley Friar PA-C  2:33 PM

## 2015-10-28 NOTE — Patient Instructions (Signed)
Labs today We will only contact you if something comes back abnormal or we need to make some changes. Otherwise no news is good news!  Your physician has recommended that you have a sleep study. This test records several body functions during sleep, including: brain activity, eye movement, oxygen and carbon dioxide blood levels, heart rate and rhythm, breathing rate and rhythm, the flow of air through your mouth and nose, snoring, body muscle movements, and chest and belly movement.  You have been referred to [facility]  For further sleep evaluation  Your physician recommends that you schedule a follow-up appointment in: 6-8 weeks with Dr Haroldine Laws  Your physician has requested that you have an echocardiogram. Echocardiography is a painless test that uses sound waves to create images of your heart. It provides your doctor with information about the size and shape of your heart and how well your heart's chambers and valves are working. This procedure takes approximately one hour. There are no restrictions for this procedure.

## 2015-11-04 DIAGNOSIS — M792 Neuralgia and neuritis, unspecified: Secondary | ICD-10-CM | POA: Diagnosis not present

## 2015-11-04 DIAGNOSIS — B351 Tinea unguium: Secondary | ICD-10-CM | POA: Diagnosis not present

## 2015-11-08 ENCOUNTER — Telehealth: Payer: Self-pay | Admitting: Internal Medicine

## 2015-11-08 ENCOUNTER — Telehealth: Payer: Self-pay

## 2015-11-08 NOTE — Telephone Encounter (Signed)
Pt request his Transportation from be completed as it will expire on 11/10/2015.  Form has been placed in your box.  Please Advise.

## 2015-11-08 NOTE — Telephone Encounter (Signed)
[name] [name] is a 60 y.o. male who was contacted via telephone for monitoring of rivaroxaban (Xarelto) therapy.    ASSESSMENT Indication(s): atrial fibrillation and CVA  Duration: indefinite  Labs:    Component Value Date/Time   AST 31 08/28/2015 1506   ALT 27 08/28/2015 1506   NA 136 10/28/2015 1439   NA 142 02/12/2015 1522   K 4.4 10/28/2015 1439   CL 106 10/28/2015 1439   CO2 23 10/28/2015 1439   GLUCOSE 108 (H) 10/28/2015 1439   HGBA1C 6.1 (H) 08/28/2015 1508   BUN 23 (H) 10/28/2015 1439   BUN 12 02/12/2015 1522   CREATININE 1.17 10/28/2015 1439   CREATININE 1.16 07/10/2015 1255   CALCIUM 10.0 10/28/2015 1439   GFRNONAA >60 10/28/2015 1439   GFRNONAA >89 08/16/2013 1448   GFRAA >60 10/28/2015 1439   GFRAA >89 08/16/2013 1448   WBC 4.9 08/28/2015 1506   HGB 15.2 08/28/2015 1506   HCT 47.4 08/28/2015 1506   PLT 184 08/28/2015 1506    rivaroxaban (Xarelto) Dose: 20MG    Safety: Patient has not had recent bleeding/thromboembolic events. Patient reports no recent signs or symptoms of bleeding, no signs of symptoms of thromboembolism. Medication changes: no.  Renal/hepatic/drug interaction concerns: no.  Adherence: Patient reports no known adherence challenges. Patient does correctly recite the dose. Contacted pharmacy and records indicate refills are not consistent. Refill dates 06/10/15 (90 day supply), Patient has not filled medication since 06/10/15 and should be out but reports that he still has some medication and has taken the Xarelto every day.  Patient Instructions: Patient advised to contact clinic or seek medical attention if signs/symptoms of bleeding or thromboembolism occur. Patient verbalized understanding by repeating back information.  Follow-up Recommended labs to consider: CBC . Next appointment: 12/24/15 with Dr. Carlye Grippe PharmD Candidate  11/08/2015, 10:39 AM

## 2015-11-10 NOTE — Telephone Encounter (Signed)
Will come down on Monday and try and sign it. Unsure what to do about this

## 2015-11-18 ENCOUNTER — Telehealth: Payer: Self-pay

## 2015-11-18 NOTE — Telephone Encounter (Signed)
Spoke to patient and scheduled him for [place] HF screening. 11-19-17 @ 12noon.

## 2015-11-20 ENCOUNTER — Other Ambulatory Visit: Payer: Self-pay

## 2015-11-20 DIAGNOSIS — I5032 Chronic diastolic (congestive) heart failure: Secondary | ICD-10-CM

## 2015-11-20 DIAGNOSIS — Z006 Encounter for examination for normal comparison and control in clinical research program: Secondary | ICD-10-CM

## 2015-11-20 DIAGNOSIS — Z01810 Encounter for preprocedural cardiovascular examination: Secondary | ICD-10-CM

## 2015-11-20 NOTE — Progress Notes (Signed)
Patient present for BeAT-HF screening visit. Consent read by patient; all aspects of consent discussed and patient signed consent before any study procedures performed. Patient states "I am happy to participate in this study as it would be good to try something that might help me and others to feel better." Patient is seen in [facility] here at [facility]. In-patient CHF admission 8-2 to 09-03-15. Patient vitals stable, states he has been compliant with his medications, no alcohol or street drugs. Patient ask engaging questions regarding the study that truly showed his understanding of the consent. [place] EKG obtained, NSR 1st AV Block; unchanged from last EKG. Writer will call patient with appointment time for echo, carotid duplex and Dr. Trula Slade visit, which he understands is part of the study screening process. Patient also verbalizes if randomized to device arm of study implant would be November 17th. Very pleasant patient.

## 2015-11-21 ENCOUNTER — Encounter: Payer: Self-pay | Admitting: Surgery

## 2015-11-21 ENCOUNTER — Telehealth: Payer: Self-pay

## 2015-11-21 ENCOUNTER — Telehealth: Payer: Self-pay | Admitting: Surgery

## 2015-11-21 NOTE — Telephone Encounter (Signed)
Sched appt 11/25/15; lab at 2:00, kathy to scan, MD at 3:15. Spoke to pt. Informed him he is a work in for the lab to and to arrive at 1:30.

## 2015-11-21 NOTE — Telephone Encounter (Signed)
-----   Message from Gregery Na, RN sent at 11/20/2015  3:10 PM EDT ----- Regarding: Carotid duplex / OV with VWB Will you please schedule Mr. Copado for Bilateral carotid duplex and an OV with Dr. Trula Slade (preferably this coming up Monday)? We are planning on scheduling him for the Barostim device 11/17. Also, please put in appt. Note for the carotid duplex, "Must be able to see below level of mandible". I will place the order for the duplex.  Thanks,  Colletta [place]

## 2015-11-21 NOTE — Telephone Encounter (Signed)
Call to patient to inform of Carotid Duplex, Dr. Trula Slade, and Echo appointments. Patient verbalizes understanding and he will be at appointments. No other questions or concerns at present time.

## 2015-11-22 ENCOUNTER — Ambulatory Visit (HOSPITAL_COMMUNITY): Payer: Medicare Other

## 2015-11-22 DIAGNOSIS — G894 Chronic pain syndrome: Secondary | ICD-10-CM | POA: Diagnosis not present

## 2015-11-22 DIAGNOSIS — M545 Low back pain: Secondary | ICD-10-CM | POA: Diagnosis not present

## 2015-11-25 ENCOUNTER — Encounter: Payer: Self-pay | Admitting: Surgery

## 2015-11-25 ENCOUNTER — Ambulatory Visit (INDEPENDENT_AMBULATORY_CARE_PROVIDER_SITE_OTHER): Payer: Medicare Other | Admitting: Surgery

## 2015-11-25 ENCOUNTER — Ambulatory Visit (HOSPITAL_COMMUNITY)
Admission: RE | Admit: 2015-11-25 | Discharge: 2015-11-25 | Disposition: A | Discharge status: Home / Self Care | Payer: Self-pay | Source: Ambulatory Visit | Attending: Vascular Surgery | Admitting: Vascular Surgery

## 2015-11-25 VITALS — BP 118/82 | HR 70 | Temp 97.8°F | Resp 24 | Ht 71.0 in | Wt 277.0 lb

## 2015-11-25 DIAGNOSIS — Z01810 Encounter for preprocedural cardiovascular examination: Secondary | ICD-10-CM

## 2015-11-25 DIAGNOSIS — I5022 Chronic systolic (congestive) heart failure: Secondary | ICD-10-CM

## 2015-11-25 DIAGNOSIS — Z006 Encounter for examination for normal comparison and control in clinical research program: Secondary | ICD-10-CM

## 2015-11-25 LAB — VAS US CAROTID
LCCADSYS: -93 cm/s
LCCAPSYS: 129 cm/s
LEFT ECA DIAS: -10 cm/s
Left CCA dist dias: -24 cm/s
Left CCA prox dias: 32 cm/s
Left ICA dist dias: -25 cm/s
Left ICA dist sys: -59 cm/s
Left ICA prox dias: 26 cm/s
Left ICA prox sys: 82 cm/s
RCCADSYS: -56 cm/s
RCCAPDIAS: 27 cm/s
RCCAPSYS: 131 cm/s
RIGHT CCA MID DIAS: 23 cm/s
RIGHT ECA DIAS: -10 cm/s

## 2015-11-25 NOTE — Progress Notes (Signed)
[facility] [place]  Patient name: [name] [name] MRN: [id number] DOB: 1955-11-06 Sex: male  REFERRING PHYSICIAN: Dr. Harrington Challenger  REASON FOR CONSULT: Barotstim  HPI: [name] [name] is a 60 y.o. male, who is referred today to see if he is a candidate for Barostim implant.  The patient has a history of hypertension diabetes, hepatitis C, embolic stroke, prostate cancer, alcohol abuse, A. fib, and heart failure.  He has been worked up from a medical perspective and felt to be a good candidate.  He is here today for surgical clearance.  Past Medical History:  Diagnosis Date  . Atrial fibrillation ([place])   . Cancer [facility])    Prostate cancer-bx. 3 weeks ago  . Cataract   . Chronic combined systolic and diastolic CHF (congestive heart failure) (H[facility])    a) EF 40-45% per 2D echo (02/2012) with grade 1 DD b)  NICM c) RHC (04/2012): RA: 4, RV 45/3/4, PA 42/9 (24), PCWP 14, Fick CO/CI: 5.2 /2.2, PVR 1.9 WU, PA 62% and 64% d) ECHO (10/2012) EF 40-45%, grade II DD, RV nl  . CKD (chronic kidney disease) stage 2, GFR 60-89 ml/min    BL SCr approximately 1-1.3  . Colitis 05/2009   History of colitis of ascending colon noted on CT abd/pelvis (05/2009), with interval resolution with subsequent CT  . Continuous chronic alcoholism ([place])   . Degenerative lumbar spinal stenosis    s/p L2-3, L3-4, L4-5 laminectomy partial facetectomy, and bilateral foraminotomy  . Diabetes mellitus without complication ([place])    Type II  . Dysrhythmia    A. Fib  . Family history of adverse reaction to anesthesia    "sister, can't go to sleep"  . GERD (gastroesophageal reflux disease)   . H/O cocaine abuse   . Hepatic steatosis    suspected 2/2 alcohol abuse  . Hepatitis C   . History of pancreatitis 01/2011   Admission for acute pancreatitis presumed 2/2 ongoing alcohol abuse- and hypertriglyceridemia  . History of pneumonia   . HIT (heparin-induced  thrombocytopenia) ([place])   . Hyperlipidemia   . Hypertension    uncontrolled with medication noncompliance  . Hypertriglyceridemia   . NICM (nonischemic cardiomyopathy) ([place])    a. LHC (04/2012): nl arteries  . PFO (patent foramen ovale) 01/2012   with right to left shunt, noted per TEE in evaluation for source of embolic stroke in 04/4313  . Rhabdomyolysis 02/22/2012   H/O rhabdomyolysis in 01/2012 that was idiopathic, cause never identified  . Schizophrenia, schizo-affective ([place])   . Shortness of breath dyspnea   . Splenic cyst   . Stroke ([place]) 01/2012   Small cerebellar infarcts right greater than left as well as questionable acute left external capsule and caudate nuclear punctate lacunar infarcts noted per MRI (01/2012) - presumed to be embolic likely source PFO with right to left shunt (noted per TEE 01/ 2014)    Family History  Problem Relation Age of Onset  . CAD Mother 9    deceased  . CAD Sister   . CAD Brother 27    died from MI at age 35yo  . Hypertension      SOCIAL HISTORY: Social History   Social History  . Marital status: Single    Spouse name: N/A  . Number of children: N/A  . Years of education: 12th grade   Occupational History  . Disability     2/2 schizophrenia   Social History Main Topics  . Smoking  status: Former Smoker    Packs/day: 0.50    Years: 30.00    Types: Cigarettes    Quit date: 06/24/2001  . Smokeless tobacco: Never Used  . Alcohol use 1.8 - 2.4 oz/week    3 - 4 Glasses of wine per week  . Drug use: No     Comment: history of cocaine abuse- none in 6 months(reminded not to use)  . Sexual activity: Not on file   Other Topics Concern  . Not on file   Social History Narrative   Lives in [place] alone, has a [facility] aide that helps with medications 4-5 days a week with medications, helping to clean.    Allergies  Allergen Reactions  . Heparin Other (See Comments)    Documented HIT under problem list Pt states he's not  allergic. " They told me not take it anymore"  . Thorazine [Chlorpromazine Hcl] Other (See Comments)    Body freezes up    Current Outpatient Prescriptions  Medication Sig Dispense Refill  . acetaminophen (TYLENOL) 500 MG tablet Take 1,000 mg by mouth every 8 (eight) hours as needed for mild pain.    . Blood Glucose Monitoring Suppl (ACCU-CHEK NANO SMARTVIEW) W/DEVICE KIT Use as instructed to test four times a day. DX: 250.92, insulin requiring 1 kit 0  . carvedilol (COREG) 6.25 MG tablet Take 6.25 mg by mouth 2 (two) times daily with a meal.    . HYDROcodone-acetaminophen (NORCO) 10-325 MG tablet Take 1 tablet by mouth every 8 (eight) hours as needed.    . pantoprazole (PROTONIX) 40 MG tablet take 1 tablet by mouth once daily 30 tablet 1  . Potassium Chloride ER 20 MEQ TBCR Take 20 mEq by mouth daily. 30 tablet 6  . QUEtiapine (SEROQUEL) 400 MG tablet Take 800 mg by mouth at bedtime.    . rivaroxaban (XARELTO) 20 MG TABS tablet Take 1 tablet (20 mg total) by mouth daily. 90 tablet 3  . sacubitril-valsartan (ENTRESTO) 49-51 MG Take 1 tablet by mouth 2 (two) times daily. 60 tablet 3  . spironolactone (ALDACTONE) 25 MG tablet Take 1 tablet (25 mg total) by mouth daily. 90 tablet 3  . terbinafine (LAMISIL) 250 MG tablet Take 250 mg by mouth daily.    Marland Kitchen torsemide (DEMADEX) 20 MG tablet Take 3 tablets (60 mg total) by mouth daily. 90 tablet 6  . traMADol (ULTRAM) 50 MG tablet Take 50 mg by mouth every 6 (six) hours as needed for moderate pain.     No current facility-administered medications for this visit.     REVIEW OF SYSTEMS:  '[X]'$  denotes positive finding, '[ ]'$  denotes negative finding Cardiac  Comments:  Chest pain or chest pressure:    Shortness of breath upon exertion:    Short of breath when lying flat: x   Irregular heart rhythm: x       Vascular    Pain in calf, thigh, or hip brought on by ambulation: x   Pain in feet at night that wakes you up from your sleep:     Blood clot  in your veins:    Leg swelling:         Pulmonary    Oxygen at home:    Productive cough:     Wheezing:         Neurologic    Sudden weakness in arms or legs:     Sudden numbness in arms or legs:     Sudden onset of difficulty  speaking or slurred speech:    Temporary loss of vision in one eye:     Problems with dizziness:         Gastrointestinal    Blood in stool:     Vomited blood:         Genitourinary    Burning when urinating:     Blood in urine:        Psychiatric    Major depression:         Hematologic    Bleeding problems:    Problems with blood clotting too easily:        Skin    Rashes or ulcers:        Constitutional    Fever or chills:      PHYSICAL EXAM: Vitals:   11/25/15 1500 11/25/15 1502  BP: 120/82 118/82  Pulse: 74 70  Resp: (!) 24   Temp: 97.8 F (36.6 C)   TempSrc: Oral   SpO2: 96%   Weight: 277 lb (125.6 kg)   Height: '5\' 11"'$  (1.803 m)     GENERAL: The patient is a well-nourished male, in no acute distress. The vital signs are documented above. CARDIAC: There is a regular rate and rhythm.  VASCULAR: Carotid ultrasound was performed to make sure the bifurcation was accessible.  There was no significant calcified plaque around the carotid bifurcation on the right. PULMONARY: There is good air exchange bilaterally  ABDOMEN: Soft and non-tender with normal pitched bowel sounds.  MUSCULOSKELETAL: There are no major deformities or cyanosis. NEUROLOGIC: No focal weakness or paresthesias are detected. SKIN: There are no ulcers or rashes noted. PSYCHIATRIC: The patient has a normal affect.  DATA:  Carotid duplex shows 1-39 percent bilateral stenosis.  The bifurcation was noted to be at the level of the mandible  ASSESSMENT AND PLAN: Congestive heart failure: Today's duplex suggested a high bifurcation.  I used ultrasound myself and feel like this should be easily accessible in the operating room.  I feel he is a good candidate for  proceeding with Barostim randomization.  We discussed the details of the procedure as well as the risks and benefits.  He will need to be off of his Xarelto prior to his operation, if he gets randomized to surgery   Annamarie Major, MD [facility] [phone] Pager [phone]

## 2015-11-26 ENCOUNTER — Ambulatory Visit (HOSPITAL_BASED_OUTPATIENT_CLINIC_OR_DEPARTMENT_OTHER)
Admission: RE | Admit: 2015-11-26 | Discharge: 2015-11-26 | Disposition: A | Discharge status: Home / Self Care | Payer: Self-pay | Source: Ambulatory Visit | Attending: Cardiology | Admitting: Cardiology

## 2015-11-26 DIAGNOSIS — I5032 Chronic diastolic (congestive) heart failure: Secondary | ICD-10-CM

## 2015-11-26 NOTE — Progress Notes (Signed)
*  PRELIMINARY RESULTS* Echocardiogram 2D Echocardiogram has been performed. This patient refused Definity.  Donata Clay 11/26/2015, 10:55 AM

## 2015-11-29 NOTE — Telephone Encounter (Signed)
Patient was contacted with Frank Tillman, PharmD candidate. I agree with the assessment and plan of care documented.  

## 2015-12-01 ENCOUNTER — Encounter (HOSPITAL_BASED_OUTPATIENT_CLINIC_OR_DEPARTMENT_OTHER): Payer: Self-pay

## 2015-12-03 ENCOUNTER — Other Ambulatory Visit: Payer: Self-pay | Admitting: Internal Medicine

## 2015-12-03 DIAGNOSIS — I5042 Chronic combined systolic (congestive) and diastolic (congestive) heart failure: Secondary | ICD-10-CM

## 2015-12-11 DIAGNOSIS — M545 Low back pain: Secondary | ICD-10-CM | POA: Diagnosis not present

## 2015-12-11 DIAGNOSIS — G894 Chronic pain syndrome: Secondary | ICD-10-CM | POA: Diagnosis not present

## 2015-12-11 NOTE — Addendum Note (Signed)
Addended by: Hulan Fray on: 12/11/2015 04:59 PM   Modules accepted: Orders

## 2015-12-12 ENCOUNTER — Institutional Professional Consult (permissible substitution): Payer: Self-pay | Admitting: Pulmonary Disease

## 2015-12-17 ENCOUNTER — Inpatient Hospital Stay (HOSPITAL_COMMUNITY)
Admission: EM | Admit: 2015-12-17 | Discharge: 2015-12-19 | DRG: 638 | Disposition: A | Discharge status: Home / Self Care | Payer: Medicare Other | Attending: Student in an Organized Health Care Education/Training Program | Admitting: Student in an Organized Health Care Education/Training Program

## 2015-12-17 ENCOUNTER — Encounter (HOSPITAL_COMMUNITY): Payer: Self-pay | Admitting: *Deleted

## 2015-12-17 ENCOUNTER — Inpatient Hospital Stay (HOSPITAL_COMMUNITY): Payer: Medicare Other

## 2015-12-17 DIAGNOSIS — Z9114 Patient's other noncompliance with medication regimen: Secondary | ICD-10-CM

## 2015-12-17 DIAGNOSIS — Z79899 Other long term (current) drug therapy: Secondary | ICD-10-CM

## 2015-12-17 DIAGNOSIS — E1100 Type 2 diabetes mellitus with hyperosmolarity without nonketotic hyperglycemic-hyperosmolar coma (NKHHC): Principal | ICD-10-CM

## 2015-12-17 DIAGNOSIS — K760 Fatty (change of) liver, not elsewhere classified: Secondary | ICD-10-CM | POA: Diagnosis not present

## 2015-12-17 DIAGNOSIS — I480 Paroxysmal atrial fibrillation: Secondary | ICD-10-CM | POA: Diagnosis present

## 2015-12-17 DIAGNOSIS — K219 Gastro-esophageal reflux disease without esophagitis: Secondary | ICD-10-CM | POA: Diagnosis present

## 2015-12-17 DIAGNOSIS — Z8249 Family history of ischemic heart disease and other diseases of the circulatory system: Secondary | ICD-10-CM

## 2015-12-17 DIAGNOSIS — Z8546 Personal history of malignant neoplasm of prostate: Secondary | ICD-10-CM

## 2015-12-17 DIAGNOSIS — I130 Hypertensive heart and chronic kidney disease with heart failure and stage 1 through stage 4 chronic kidney disease, or unspecified chronic kidney disease: Secondary | ICD-10-CM | POA: Diagnosis not present

## 2015-12-17 DIAGNOSIS — R05 Cough: Secondary | ICD-10-CM | POA: Diagnosis not present

## 2015-12-17 DIAGNOSIS — R0602 Shortness of breath: Secondary | ICD-10-CM

## 2015-12-17 DIAGNOSIS — I110 Hypertensive heart disease with heart failure: Secondary | ICD-10-CM

## 2015-12-17 DIAGNOSIS — Z8701 Personal history of pneumonia (recurrent): Secondary | ICD-10-CM

## 2015-12-17 DIAGNOSIS — E1165 Type 2 diabetes mellitus with hyperglycemia: Secondary | ICD-10-CM | POA: Diagnosis not present

## 2015-12-17 DIAGNOSIS — E1111 Type 2 diabetes mellitus with ketoacidosis with coma: Secondary | ICD-10-CM

## 2015-12-17 DIAGNOSIS — Z87891 Personal history of nicotine dependence: Secondary | ICD-10-CM

## 2015-12-17 DIAGNOSIS — I5022 Chronic systolic (congestive) heart failure: Secondary | ICD-10-CM | POA: Diagnosis not present

## 2015-12-17 DIAGNOSIS — Z8673 Personal history of transient ischemic attack (TIA), and cerebral infarction without residual deficits: Secondary | ICD-10-CM | POA: Diagnosis not present

## 2015-12-17 DIAGNOSIS — N182 Chronic kidney disease, stage 2 (mild): Secondary | ICD-10-CM | POA: Diagnosis present

## 2015-12-17 DIAGNOSIS — E860 Dehydration: Secondary | ICD-10-CM | POA: Diagnosis not present

## 2015-12-17 DIAGNOSIS — E870 Hyperosmolality and hypernatremia: Secondary | ICD-10-CM | POA: Diagnosis not present

## 2015-12-17 DIAGNOSIS — E785 Hyperlipidemia, unspecified: Secondary | ICD-10-CM | POA: Diagnosis not present

## 2015-12-17 DIAGNOSIS — Z888 Allergy status to other drugs, medicaments and biological substances status: Secondary | ICD-10-CM

## 2015-12-17 DIAGNOSIS — E872 Acidosis: Secondary | ICD-10-CM | POA: Diagnosis not present

## 2015-12-17 DIAGNOSIS — E1110 Type 2 diabetes mellitus with ketoacidosis without coma: Secondary | ICD-10-CM | POA: Diagnosis not present

## 2015-12-17 DIAGNOSIS — I5042 Chronic combined systolic (congestive) and diastolic (congestive) heart failure: Secondary | ICD-10-CM | POA: Diagnosis not present

## 2015-12-17 DIAGNOSIS — E1310 Other specified diabetes mellitus with ketoacidosis without coma: Secondary | ICD-10-CM | POA: Diagnosis not present

## 2015-12-17 DIAGNOSIS — Z7901 Long term (current) use of anticoagulants: Secondary | ICD-10-CM

## 2015-12-17 DIAGNOSIS — I429 Cardiomyopathy, unspecified: Secondary | ICD-10-CM | POA: Diagnosis present

## 2015-12-17 DIAGNOSIS — F209 Schizophrenia, unspecified: Secondary | ICD-10-CM | POA: Diagnosis present

## 2015-12-17 DIAGNOSIS — E119 Type 2 diabetes mellitus without complications: Secondary | ICD-10-CM

## 2015-12-17 DIAGNOSIS — Z9119 Patient's noncompliance with other medical treatment and regimen: Secondary | ICD-10-CM

## 2015-12-17 DIAGNOSIS — R531 Weakness: Secondary | ICD-10-CM | POA: Diagnosis present

## 2015-12-17 LAB — GLUCOSE, CAPILLARY
GLUCOSE-CAPILLARY: 304 mg/dL — AB (ref 65–99)
GLUCOSE-CAPILLARY: 163 mg/dL — AB (ref 65–99)
GLUCOSE-CAPILLARY: 164 mg/dL — AB (ref 65–99)
Glucose-Capillary: 144 mg/dL — ABNORMAL HIGH (ref 65–99)
Glucose-Capillary: 178 mg/dL — ABNORMAL HIGH (ref 65–99)
Glucose-Capillary: 220 mg/dL — ABNORMAL HIGH (ref 65–99)
Glucose-Capillary: 277 mg/dL — ABNORMAL HIGH (ref 65–99)
Glucose-Capillary: 358 mg/dL — ABNORMAL HIGH (ref 65–99)
Glucose-Capillary: 472 mg/dL — ABNORMAL HIGH (ref 65–99)

## 2015-12-17 LAB — BASIC METABOLIC PANEL
Anion gap: 18 — ABNORMAL HIGH (ref 5–15)
Anion gap: 11 (ref 5–15)
Anion gap: 8 (ref 5–15)
Anion gap: 8 (ref 5–15)
BUN: 33 mg/dL — ABNORMAL HIGH (ref 6–20)
BUN: 25 mg/dL — AB (ref 6–20)
BUN: 20 mg/dL (ref 6–20)
BUN: 22 mg/dL — AB (ref 6–20)
CALCIUM: 10.3 mg/dL (ref 8.9–10.3)
CALCIUM: 9.8 mg/dL (ref 8.9–10.3)
CHLORIDE: 90 mmol/L — AB (ref 101–111)
CO2: 14 mmol/L — AB (ref 22–32)
CO2: 19 mmol/L — ABNORMAL LOW (ref 22–32)
CO2: 22 mmol/L (ref 22–32)
CO2: 20 mmol/L — ABNORMAL LOW (ref 22–32)
CREATININE: 1.64 mg/dL — AB (ref 0.61–1.24)
CREATININE: 1.13 mg/dL (ref 0.61–1.24)
Calcium: 10.4 mg/dL — ABNORMAL HIGH (ref 8.9–10.3)
Calcium: 9.7 mg/dL (ref 8.9–10.3)
Chloride: 100 mmol/L — ABNORMAL LOW (ref 101–111)
Chloride: 99 mmol/L — ABNORMAL LOW (ref 101–111)
Chloride: 102 mmol/L (ref 101–111)
Creatinine, Ser: 0.89 mg/dL (ref 0.61–1.24)
Creatinine, Ser: 0.93 mg/dL (ref 0.61–1.24)
GFR calc Af Amer: >60 mL/min (ref >60)
GFR calc Af Amer: >60 mL/min (ref >60)
GFR calc Af Amer: >60 mL/min (ref >60)
GFR calc non Af Amer: 44 mL/min — ABNORMAL LOW (ref >60)
GFR, EST AFRICAN AMERICAN: 51 mL/min — AB (ref >60)
GLUCOSE: 311 mg/dL — AB (ref 65–99)
GLUCOSE: 135 mg/dL — AB (ref 65–99)
GLUCOSE: 231 mg/dL — AB (ref 65–99)
Glucose, Bld: 999 mg/dL (ref 65–99)
POTASSIUM: 3.9 mmol/L (ref 3.5–5.1)
POTASSIUM: 3.3 mmol/L — AB (ref 3.5–5.1)
Potassium: 6.3 mmol/L (ref 3.5–5.1)
Potassium: 4.5 mmol/L (ref 3.5–5.1)
Sodium: 122 mmol/L — ABNORMAL LOW (ref 135–145)
Sodium: 130 mmol/L — ABNORMAL LOW (ref 135–145)
Sodium: 129 mmol/L — ABNORMAL LOW (ref 135–145)
Sodium: 130 mmol/L — ABNORMAL LOW (ref 135–145)

## 2015-12-17 LAB — CBG MONITORING, ED
GLUCOSE-CAPILLARY: 510 mg/dL — AB (ref 65–99)
Glucose-Capillary: >600 mg/dL (ref 65–99)
Glucose-Capillary: >600 mg/dL (ref 65–99)

## 2015-12-17 LAB — I-STAT VENOUS BLOOD GAS, ED
Acid-base deficit: 1 mmol/L (ref 0.0–2.0)
BICARBONATE: 23.9 mmol/L (ref 20.0–28.0)
O2 SAT: 85 %
PCO2 VEN: 38.1 mmHg — AB (ref 44.0–60.0)
PH VEN: 7.406 (ref 7.250–7.430)
PO2 VEN: 49 mmHg — AB (ref 32.0–45.0)
TCO2: 25 mmol/L (ref 0–100)

## 2015-12-17 LAB — URINALYSIS, ROUTINE W REFLEX MICROSCOPIC
Bilirubin Urine: NEGATIVE
HGB URINE DIPSTICK: NEGATIVE
Ketones, ur: 15 mg/dL — AB
Leukocytes, UA: NEGATIVE
Nitrite: NEGATIVE
PH: 5.5 (ref 5.0–8.0)
PROTEIN: NEGATIVE mg/dL
SPECIFIC GRAVITY, URINE: 1.031 — AB (ref 1.005–1.030)

## 2015-12-17 LAB — CBC
HEMATOCRIT: 44.4 % (ref 39.0–52.0)
Hemoglobin: 15.8 g/dL (ref 13.0–17.0)
MCH: 30.4 pg (ref 26.0–34.0)
MCHC: 35.6 g/dL (ref 30.0–36.0)
MCV: 85.5 fL (ref 78.0–100.0)
PLATELETS: 168 10*3/uL (ref 150–400)
RBC: 5.19 MIL/uL (ref 4.22–5.81)
RDW: 12.7 % (ref 11.5–15.5)
WBC: 5.4 10*3/uL (ref 4.0–10.5)

## 2015-12-17 LAB — I-STAT CHEM 8, ED
BUN: 43 mg/dL — ABNORMAL HIGH (ref 6–20)
Calcium, Ion: 1.23 mmol/L (ref 1.15–1.40)
Chloride: 93 mmol/L — ABNORMAL LOW (ref 101–111)
Creatinine, Ser: 1.4 mg/dL — ABNORMAL HIGH (ref 0.61–1.24)
Glucose, Bld: >700 mg/dL (ref 65–99)
HCT: 50 % (ref 39.0–52.0)
Hemoglobin: 17 g/dL (ref 13.0–17.0)
Potassium: 5.6 mmol/L — ABNORMAL HIGH (ref 3.5–5.1)
Sodium: 122 mmol/L — ABNORMAL LOW (ref 135–145)
TCO2: 23 mmol/L (ref 0–100)

## 2015-12-17 LAB — URINE MICROSCOPIC-ADD ON
Bacteria, UA: NONE SEEN
RBC / HPF: NONE SEEN RBC/hpf (ref 0–5)
WBC, UA: NONE SEEN WBC/hpf (ref 0–5)

## 2015-12-17 LAB — MAGNESIUM: MAGNESIUM: 2.5 mg/dL — AB (ref 1.7–2.4)

## 2015-12-17 LAB — PHOSPHORUS: PHOSPHORUS: 2.3 mg/dL — AB (ref 2.5–4.6)

## 2015-12-17 LAB — MRSA PCR SCREENING: MRSA BY PCR: NEGATIVE

## 2015-12-17 MED ORDER — CARVEDILOL 6.25 MG PO TABS: 6.2500 mg | ORAL_TABLET | Freq: Two times a day (BID) | ORAL | Status: DC

## 2015-12-17 MED ORDER — SACUBITRIL-VALSARTAN 49-51 MG PO TABS
1.0000 | ORAL_TABLET | Freq: Two times a day (BID) | ORAL | Status: DC
  Administered 2015-12-17 – 2015-12-19 (×4): 1 via ORAL
  Filled 2015-12-17 (×4): qty 1

## 2015-12-17 MED ORDER — SODIUM CHLORIDE 0.9 % IV BOLUS (SEPSIS)
1000.0000 mL | Freq: Once | INTRAVENOUS | Status: AC
Start: 2015-12-17 — End: 2015-12-17
  Administered 2015-12-17: 1000 mL via INTRAVENOUS

## 2015-12-17 MED ORDER — POTASSIUM CHLORIDE ER 20 MEQ PO TBCR: 20.0000 meq | EXTENDED_RELEASE_TABLET | Freq: Every day | ORAL | Status: DC

## 2015-12-17 MED ORDER — INSULIN GLARGINE 100 UNIT/ML ~~LOC~~ SOLN
15.0000 [IU] | Freq: Once | SUBCUTANEOUS | Status: AC
  Administered 2015-12-17: 15 [IU] via SUBCUTANEOUS
  Filled 2015-12-17: qty 0.15

## 2015-12-17 MED ORDER — QUETIAPINE FUMARATE 50 MG PO TABS
400.0000 mg | ORAL_TABLET | Freq: Every day | ORAL | Status: DC
  Administered 2015-12-17 – 2015-12-18 (×2): 400 mg via ORAL
  Filled 2015-12-17 (×2): qty 8

## 2015-12-17 MED ORDER — POTASSIUM CHLORIDE CRYS ER 20 MEQ PO TBCR
20.0000 meq | EXTENDED_RELEASE_TABLET | Freq: Every day | ORAL | Status: DC
  Administered 2015-12-17 – 2015-12-19 (×3): 20 meq via ORAL
  Filled 2015-12-17 (×3): qty 1

## 2015-12-17 MED ORDER — DEXTROSE-NACL 5-0.45 % IV SOLN
INTRAVENOUS | Status: DC
  Administered 2015-12-17: 18:00:00 via INTRAVENOUS

## 2015-12-17 MED ORDER — PANTOPRAZOLE SODIUM 40 MG PO TBEC
40.0000 mg | DELAYED_RELEASE_TABLET | Freq: Every day | ORAL | Status: DC
Start: 2015-12-17 — End: 2015-12-19
  Administered 2015-12-17 – 2015-12-19 (×3): 40 mg via ORAL
  Filled 2015-12-17 (×3): qty 1

## 2015-12-17 MED ORDER — ACETAMINOPHEN 500 MG PO TABS: 1000.0000 mg | ORAL_TABLET | Freq: Three times a day (TID) | ORAL | Status: DC | PRN

## 2015-12-17 MED ORDER — SODIUM CHLORIDE 0.9 % IV SOLN
INTRAVENOUS | Status: DC
  Administered 2015-12-17: 5.4 [IU]/h via INTRAVENOUS
  Filled 2015-12-17: qty 2.5

## 2015-12-17 MED ORDER — SODIUM CHLORIDE 0.9 % IV BOLUS (SEPSIS)
1000.0000 mL | Freq: Once | INTRAVENOUS | Status: AC
  Administered 2015-12-17: 1000 mL via INTRAVENOUS

## 2015-12-17 MED ORDER — RIVAROXABAN 20 MG PO TABS
20.0000 mg | ORAL_TABLET | Freq: Every day | ORAL | Status: DC
  Administered 2015-12-17 – 2015-12-18 (×2): 20 mg via ORAL
  Filled 2015-12-17 (×2): qty 1

## 2015-12-17 MED ORDER — SODIUM CHLORIDE 0.9 % IV SOLN
INTRAVENOUS | Status: DC
  Administered 2015-12-17: 16:00:00 via INTRAVENOUS

## 2015-12-17 MED ORDER — SODIUM CHLORIDE 0.9 % IV BOLUS (SEPSIS): 1000.0000 mL | Freq: Once | INTRAVENOUS | Status: DC

## 2015-12-17 MED ORDER — SODIUM CHLORIDE 0.9 % IV SOLN
INTRAVENOUS | Status: DC
Start: 2015-12-17 — End: 2015-12-17
  Administered 2015-12-17: 12:00:00 via INTRAVENOUS

## 2015-12-17 MED ORDER — HYDROCODONE-ACETAMINOPHEN 10-325 MG PO TABS
1.0000 | ORAL_TABLET | Freq: Three times a day (TID) | ORAL | Status: DC | PRN
  Administered 2015-12-17 – 2015-12-19 (×4): 1 via ORAL
  Filled 2015-12-17 (×4): qty 1

## 2015-12-17 MED ORDER — SODIUM CHLORIDE 0.9 % IV SOLN
INTRAVENOUS | Status: AC
  Filled 2015-12-17: qty 2.5

## 2015-12-17 NOTE — ED Notes (Signed)
Pt has used restroom, providing a UA, and is now changing into a gown. Notified Alecia, RN of pt status.  Complaint of hand cramping and increased thirst with hx of DM.  Ambulatory with slow steady gait in lobby.

## 2015-12-17 NOTE — ED Provider Notes (Signed)
[facility] DEPT Provider Note   CSN: [id number] Arrival date & time: 12/17/15  0815     History   Chief Complaint Chief Complaint  Patient presents with  . Polydipsia  . Nausea  . Dizziness    HPI [name] [name] is a 60 y.o. male.  HPI   60 year old male with a history of hypertension, type 2 diabetes, schizophrenia, hepatitis C, cerebellar as well as left brainstem embolic stroke, prostate cancer, alcohol abuse, former cocaine abuse, atrial fibrillation, and chronic combined systolic and diastolic heart failure presents today with polydipsia and polyuria, blurred vision. Patient notes he is a diabetic, has not taken any of his medication over the last 6 months. He reports that one of his doctors told him to stop taking one of his medications, he decided to stop taking all medications. Patient reports over the last 7 days he started developing nausea, thirst, over the last 2 days he's developed blurred vision extreme fatigue. Patient denies any recent illnesses, denies any drug or alcohol use, or any signs of infection. Patient denies any shortness of breath, chest pain, signs of fluid overload  Most recent echo on 11/26/2015 with LVEF of 30%  Past Medical History:  Diagnosis Date  . Atrial fibrillation ([place])   . Cancer [facility])    Prostate cancer-bx. 3 weeks ago  . Cataract   . Chronic combined systolic and diastolic CHF (congestive heart failure) (HCC)    a) EF 40-45% per 2D echo (02/2012) with grade 1 DD b)  NICM c) RHC (04/2012): RA: 4, RV 45/3/4, PA 42/9 (24), PCWP 14, Fick CO/CI: 5.2 /2.2, PVR 1.9 WU, PA 62% and 64% d) ECHO (10/2012) EF 40-45%, grade II DD, RV nl  . CKD (chronic kidney disease) stage 2, GFR 60-89 ml/min    BL SCr approximately 1-1.3  . Colitis 05/2009   History of colitis of ascending colon noted on CT abd/pelvis (05/2009), with interval resolution with subsequent CT  . Continuous chronic alcoholism ([place])   . Degenerative lumbar spinal stenosis    s/p L2-3, L3-4, L4-5 laminectomy partial facetectomy, and bilateral foraminotomy  . Diabetes mellitus without complication ([place])    Type II  . Dysrhythmia    A. Fib  . Family history of adverse reaction to anesthesia    "sister, can't go to sleep"  . GERD (gastroesophageal reflux disease)   . H/O cocaine abuse   . Hepatic steatosis    suspected 2/2 alcohol abuse  . Hepatitis C   . History of pancreatitis 01/2011   Admission for acute pancreatitis presumed 2/2 ongoing alcohol abuse- and hypertriglyceridemia  . History of pneumonia   . HIT (heparin-induced thrombocytopenia) ([place])   . Hyperlipidemia   . Hypertension    uncontrolled with medication noncompliance  . Hypertriglyceridemia   . NICM (nonischemic cardiomyopathy) ([place])    a. LHC (04/2012): nl arteries  . PFO (patent foramen ovale) 01/2012   with right to left shunt, noted per TEE in evaluation for source of embolic stroke in 05/2776  . Rhabdomyolysis 02/22/2012   H/O rhabdomyolysis in 01/2012 that was idiopathic, cause never identified  . Schizophrenia, schizo-affective ([place])   . Shortness of breath dyspnea   . Splenic cyst   . Stroke ([place]) 01/2012   Small cerebellar infarcts right greater than left as well as questionable acute left external capsule and caudate nuclear punctate lacunar infarcts noted per MRI (01/2012) - presumed to be embolic likely source PFO with right to left shunt (noted per TEE 01/  2014)    Patient Active Problem List   Diagnosis Date Noted  . DKA (diabetic ketoacidoses) ([place]) 12/17/2015  . At risk for obstructive sleep apnea 10/28/2015  . Overgrown toenails 09/10/2015  . Infestation by bed bug 06/12/2015  . Heart failure with reduced ejection fraction, NYHA class III ([place]) 06/07/2015  . Hepatitis C antibody test positive 06/07/2015  . Paroxysmal atrial fibrillation ([place]) 06/07/2015  . Healthcare maintenance 04/19/2014  . Schizophrenia ([place]) 03/06/2014  . Chronic back pain greater than 3 months  duration 07/19/2012  . Gastric ulcer with hemorrhage 06/25/2012  . CKD (chronic kidney disease) stage 2, GFR 60-89 ml/min   . CVA (cerebral infarction) 02/29/2012  . HTN (hypertension) 02/20/2012    Past Surgical History:  Procedure Laterality Date  . ACHILLES TENDON REPAIR Right 2007   "it was torn"  . BACK SURGERY    . CARDIAC CATHETERIZATION N/A 09/02/2015   Procedure: Right Heart Cath;  Surgeon: Jolaine Artist, MD;  Location: [facility];  Service: Cardiovascular;  Laterality: N/A;  . CATARACT EXTRACTION W/ INTRAOCULAR LENS IMPLANT Right   . ESOPHAGOGASTRODUODENOSCOPY N/A 06/25/2012   Procedure: ESOPHAGOGASTRODUODENOSCOPY (EGD);  Surgeon: Milus Banister, MD;  Location: [facility];  Service: Endoscopy;  Laterality: N/A;  . I&D EXTREMITY  03/20/2011   Procedure: IRRIGATION AND DEBRIDEMENT EXTREMITY;  Surgeon: Kerin Salen, MD;  Location: [facility];  Service: Orthopedics;  Laterality: Left;  I&D LEFT ACHILLIES TENDON  . KNEE ARTHROSCOPY Left   . LUMBAR LAMINECTOMY     L2-3, L3-4, L4-5 laminectomy, partial facetectomy  . RESECTION DISTAL CLAVICAL  09/17/2011   Procedure: RESECTION DISTAL CLAVICAL;  Surgeon: Nita Sells, MD;  Location: [facility];  Service: Orthopedics;  Laterality: Right;  right shoulder arthroscopy with sad and open distal clavicle excision   . ROBOT ASSISTED LAPAROSCOPIC RADICAL PROSTATECTOMY N/A 12/16/2012   Procedure: ROBOTIC ASSISTED LAPAROSCOPIC PROSTATECTOMY ;  Surgeon: Ardis Hughs, MD;  Location: [facility];  Service: Urology;  Laterality: N/A;  . TONSILLECTOMY         Home Medications    Prior to Admission medications   Medication Sig Start Date End Date Taking? Authorizing Provider  acetaminophen (TYLENOL) 500 MG tablet Take 1,000 mg by mouth every 8 (eight) hours as needed for mild pain.   Yes Historical Provider, MD  carvedilol (COREG) 6.25 MG tablet Take 6.25 mg by mouth 2 (two) times daily with a meal.   Yes  Historical Provider, MD  HYDROcodone-acetaminophen (NORCO) 10-325 MG tablet Take 1 tablet by mouth every 8 (eight) hours as needed.   Yes Historical Provider, MD  pantoprazole (PROTONIX) 40 MG tablet take 1 tablet by mouth once daily 08/06/15  Yes Nischal Narendra, MD  Potassium Chloride ER 20 MEQ TBCR Take 20 mEq by mouth daily. 09/04/15  Yes Amy D Clegg, NP  QUEtiapine (SEROQUEL) 400 MG tablet Take 400 mg by mouth at bedtime.    Yes Historical Provider, MD  rivaroxaban (XARELTO) 20 MG TABS tablet Take 1 tablet (20 mg total) by mouth daily. 06/07/15  Yes Loleta Chance, MD  sacubitril-valsartan (ENTRESTO) 49-51 MG Take 1 tablet by mouth 2 (two) times daily. 09/25/15  Yes Amy D Clegg, NP  spironolactone (ALDACTONE) 25 MG tablet Take 1 tablet (25 mg total) by mouth daily. 05/30/15  Yes Loleta Chance, MD  torsemide (DEMADEX) 20 MG tablet Take 3 tablets (60 mg total) by mouth daily. Patient taking differently: Take 80 mg by mouth daily.  09/04/15  Yes Amy D Clegg, NP  Blood Glucose Monitoring Suppl (ACCU-CHEK NANO SMARTVIEW) W/DEVICE KIT Use as instructed to test four times a day. DX: 250.92, insulin requiring Patient not taking: Reported on 12/17/2015 03/07/14   Kerrie Buffalo, NP    Family History Family History  Problem Relation Age of Onset  . CAD Mother 88    deceased  . CAD Sister   . CAD Brother 47    died from MI at age 67yo  . Hypertension      Social History Social History  Substance Use Topics  . Smoking status: Former Smoker    Packs/day: 1.00    Years: 30.00    Types: Cigarettes    Quit date: 06/24/2001  . Smokeless tobacco: Never Used  . Alcohol use 0.0 oz/week     Comment: 12/17/2015 "stopped drinking 4-6 months ago"     Allergies   Heparin and Thorazine [chlorpromazine hcl]   Review of Systems Review of Systems  All other systems reviewed and are negative.    Physical Exam Updated Vital Signs BP 135/87   Pulse 81   Temp 98 F (36.7 C) (Oral)   Resp (!) 21   Ht 5'  11" (1.803 m)   Wt 124.7 kg   SpO2 98%   BMI 38.35 kg/m   Physical Exam  Constitutional: He is oriented to person, place, and time. He appears well-developed and well-nourished.  HENT:  Head: Normocephalic and atraumatic.  Dry mucus membranes   Eyes: Conjunctivae are normal. Pupils are equal, round, and reactive to light. Right eye exhibits no discharge. Left eye exhibits no discharge. No scleral icterus.  Neck: Normal range of motion. No JVD present. No tracheal deviation present.  Cardiovascular: Normal rate and regular rhythm.   Pulmonary/Chest: Effort normal and breath sounds normal. No stridor. No respiratory distress. He has no wheezes. He has no rales. He exhibits no tenderness.  Musculoskeletal: Normal range of motion. He exhibits no edema.  Neurological: He is alert and oriented to person, place, and time. Coordination normal.  Psychiatric: He has a normal mood and affect. His behavior is normal. Judgment and thought content normal.  Nursing note and vitals reviewed.    ED Treatments / Results  Labs (all labs ordered are listed, but only abnormal results are displayed) Labs Reviewed  BASIC METABOLIC PANEL - Abnormal; Notable for the following:       Result Value   Sodium 122 (*)    Potassium 6.3 (*)    Chloride 90 (*)    CO2 14 (*)    Glucose, Bld 999 (*)    BUN 33 (*)    Creatinine, Ser 1.64 (*)    Calcium 10.4 (*)    GFR calc non Af Amer 44 (*)    GFR calc Af Amer 51 (*)    Anion gap 18 (*)    All other components within normal limits  URINALYSIS, ROUTINE W REFLEX MICROSCOPIC (NOT AT [facility]) - Abnormal; Notable for the following:    APPearance CLOUDY (*)    Specific Gravity, Urine 1.031 (*)    Glucose, UA >1000 (*)    Ketones, ur 15 (*)    All other components within normal limits  URINE MICROSCOPIC-ADD ON - Abnormal; Notable for the following:    Squamous Epithelial / LPF 0-5 (*)    All other components within normal limits  GLUCOSE, CAPILLARY - Abnormal;  Notable for the following:    Glucose-Capillary 472 (*)    All other  components within normal limits  GLUCOSE, CAPILLARY - Abnormal; Notable for the following:    Glucose-Capillary 304 (*)    All other components within normal limits  CBG MONITORING, ED - Abnormal; Notable for the following:    Glucose-Capillary >600 (*)    All other components within normal limits  I-STAT CHEM 8, ED - Abnormal; Notable for the following:    Sodium 122 (*)    Potassium 5.6 (*)    Chloride 93 (*)    BUN 43 (*)    Creatinine, Ser 1.40 (*)    Glucose, Bld >700 (*)    All other components within normal limits  I-STAT VENOUS BLOOD GAS, ED - Abnormal; Notable for the following:    pCO2, Ven 38.1 (*)    pO2, Ven 49.0 (*)    All other components within normal limits  CBG MONITORING, ED - Abnormal; Notable for the following:    Glucose-Capillary >600 (*)    All other components within normal limits  CBG MONITORING, ED - Abnormal; Notable for the following:    Glucose-Capillary >600 (*)    All other components within normal limits  CBG MONITORING, ED - Abnormal; Notable for the following:    Glucose-Capillary >600 (*)    All other components within normal limits  CBG MONITORING, ED - Abnormal; Notable for the following:    Glucose-Capillary 510 (*)    All other components within normal limits  MRSA PCR SCREENING  CBC  BASIC METABOLIC PANEL  BASIC METABOLIC PANEL  BASIC METABOLIC PANEL  BASIC METABOLIC PANEL  MAGNESIUM  PHOSPHORUS    EKG  EKG Interpretation  Date/Time:  Tuesday December 17 2015 10:25:27 EST Ventricular Rate:  74 PR Interval:    QRS Duration: 150 QT Interval:  418 QTC Calculation: 464 R Axis:   141 Text Interpretation:  Sinus rhythm Nonspecific intraventricular conduction delay Consider anterior infarct Nonspecific T wave abnormality, improved in Anteroseptal leads Otherwise no significant change Confirmed by [facility] MD, PEDRO ([id number]) on 12/17/2015 10:34:05 AM        Radiology No results found.  Procedures Procedures (including critical care time)  Medications Ordered in ED Medications  HYDROcodone-acetaminophen (NORCO) 10-325 MG per tablet 1 tablet (not administered)  sacubitril-valsartan (ENTRESTO) 49-51 mg per tablet (not administered)  acetaminophen (TYLENOL) tablet 1,000 mg (not administered)  QUEtiapine (SEROQUEL) tablet 400 mg (not administered)  pantoprazole (PROTONIX) EC tablet 40 mg (not administered)  rivaroxaban (XARELTO) tablet 20 mg (not administered)  0.9 %  sodium chloride infusion ( Intravenous New Bag/Given 12/17/15 1539)  dextrose 5 %-0.45 % sodium chloride infusion ( Intravenous Hold 12/17/15 1500)  insulin regular (NOVOLIN R,HUMULIN R) 250 Units in sodium chloride 0.9 % 250 mL (1 Units/mL) infusion (16.7 Units/hr Intravenous Rate/Dose Change 12/17/15 1500)  potassium chloride SA (K-DUR,KLOR-CON) CR tablet 20 mEq (not administered)  sodium chloride 0.9 % bolus 1,000 mL (0 mLs Intravenous Stopped 12/17/15 1207)  sodium chloride 0.9 % bolus 1,000 mL (0 mLs Intravenous Stopped 12/17/15 1433)     Initial Impression / Assessment and Plan / ED Course  I have reviewed the triage vital signs and the nursing notes.  Pertinent labs & imaging results that were available during my care of the patient were reviewed by me and considered in my medical decision making (see chart for details).  Clinical Course      Final Clinical Impressions(s) / ED Diagnoses   Final diagnoses:  Diabetic ketoacidosis with coma associated with type 2 diabetes mellitus ([place])  Labs: Glucose 999, creatinine 1.64, anion gap 18  Imaging:  Consults:  Therapeutics: Insulin, normal saline  Discharge Meds:   Assessment/Plan:  60 year old male presents today in DKA. Patient has significantly elevated glucose, ketones in his urine, and anion gap. Patient was given fluids here, started on insulin, hospital service consultation for  admission.    New Prescriptions Current Discharge Medication List       Okey Regal, PA-C 12/17/15 1627    Fatima Blank, MD 12/20/15 [phone]

## 2015-12-17 NOTE — Telephone Encounter (Signed)
Call to patient as he missed his Cardiovascular Research Appt today at 2pm; left message to call.

## 2015-12-17 NOTE — ED Notes (Signed)
EKG given to Dr. Cardama  

## 2015-12-17 NOTE — ED Notes (Signed)
Patient given some water to drink per PA

## 2015-12-17 NOTE — ED Notes (Signed)
Pt's CBG "Hi:  >600".  Informed Estill Batten, RN.

## 2015-12-17 NOTE — H&P (Signed)
Date: 12/17/2015               Patient Name:  [name] [name] MRN: [id number]  DOB: 08-Aug-1955 Age / Sex: 60 y.o., male   PCP: Aldine Contes, MD         Medical Service: Internal Medicine Teaching Service         Attending Physician: Dr. Axel Filler, MD    First Contact: Dr. Inda Castle Pager: [phone]  Second Contact: Dr. Benjamine Mola Pager: [phone]       After Hours (After 5p/  First Contact Pager: [phone]  weekends / holidays): Second Contact Pager: [phone]   Chief Complaint: "I'm feeling bad and my vision is blurry."  History of Present Illness: [name] is a 60 year old man with history of HTN, pAFib, schizophrenia, CHF (last EF 30%), DM2 (last A1c 6.1%) who presents with subacute generalized weakness, blurred vision, polydipsia, and polyuria, and was found to have blood glucose of 999.  For the past week, [name] has noticed progressively worsening symptoms of malaise, dry mouth, polydipsia, polyuria, and nausea for the past week.  He has not taken any insulin in the past 6 months.  His diet has gotten worse since then, and he now reports drinking lots of regular soda, and eating lots of pasta and chocolate cake.  A month ago he stopped taking his lasix and developed some shortness of breath and leg swelling.  He started taking his lasix again in the past week and his shortness of breath resolved.  Meds:  Current Meds  Medication Sig  . acetaminophen (TYLENOL) 500 MG tablet Take 1,000 mg by mouth every 8 (eight) hours as needed for mild pain.  . carvedilol (COREG) 6.25 MG tablet Take 6.25 mg by mouth 2 (two) times daily with a meal.  . HYDROcodone-acetaminophen (NORCO) 10-325 MG tablet Take 1 tablet by mouth every 8 (eight) hours as needed.  . pantoprazole (PROTONIX) 40 MG tablet take 1 tablet by mouth once daily  . Potassium Chloride ER 20 MEQ TBCR Take 20 mEq by mouth daily.  . QUEtiapine (SEROQUEL) 400 MG tablet Take 400 mg by mouth at bedtime.   .  rivaroxaban (XARELTO) 20 MG TABS tablet Take 1 tablet (20 mg total) by mouth daily.  . sacubitril-valsartan (ENTRESTO) 49-51 MG Take 1 tablet by mouth 2 (two) times daily.  Marland Kitchen spironolactone (ALDACTONE) 25 MG tablet Take 1 tablet (25 mg total) by mouth daily.  Marland Kitchen torsemide (DEMADEX) 20 MG tablet Take 3 tablets (60 mg total) by mouth daily. (Patient taking differently: Take 80 mg by mouth daily. )    Allergies: Allergies as of 12/17/2015 - Review Complete 12/17/2015  Allergen Reaction Noted  . Heparin Other (See Comments) 12/16/2012  . Thorazine [chlorpromazine hcl] Other (See Comments)    Past Medical History:  Diagnosis Date  . Atrial fibrillation ([place])   . Cancer [facility])    Prostate cancer-bx. 3 weeks ago  . Cataract   . Chronic combined systolic and diastolic CHF (congestive heart failure) (HCC)    a) EF 40-45% per 2D echo (02/2012) with grade 1 DD b)  NICM c) RHC (04/2012): RA: 4, RV 45/3/4, PA 42/9 (24), PCWP 14, Fick CO/CI: 5.2 /2.2, PVR 1.9 WU, PA 62% and 64% d) ECHO (10/2012) EF 40-45%, grade II DD, RV nl  . CKD (chronic kidney disease) stage 2, GFR 60-89 ml/min    BL SCr approximately 1-1.3  . Colitis 05/2009   History of colitis  of ascending colon noted on CT abd/pelvis (05/2009), with interval resolution with subsequent CT  . Continuous chronic alcoholism ([place])   . Degenerative lumbar spinal stenosis    s/p L2-3, L3-4, L4-5 laminectomy partial facetectomy, and bilateral foraminotomy  . Diabetes mellitus without complication ([name])    Type II  . Dysrhythmia    A. Fib  . Family history of adverse reaction to anesthesia    "sister, can't go to sleep"  . GERD (gastroesophageal reflux disease)   . H/O cocaine abuse   . Hepatic steatosis    suspected 2/2 alcohol abuse  . Hepatitis C   . History of pancreatitis 01/2011   Admission for acute pancreatitis presumed 2/2 ongoing alcohol abuse- and hypertriglyceridemia  . History of pneumonia   . HIT (heparin-induced  thrombocytopenia) ([place])   . Hyperlipidemia   . Hypertension    uncontrolled with medication noncompliance  . Hypertriglyceridemia   . NICM (nonischemic cardiomyopathy) ([place])    a. LHC (04/2012): nl arteries  . PFO (patent foramen ovale) 01/2012   with right to left shunt, noted per TEE in evaluation for source of embolic stroke in 05/3297  . Rhabdomyolysis 02/22/2012   H/O rhabdomyolysis in 01/2012 that was idiopathic, cause never identified  . Schizophrenia, schizo-affective ([place])   . Shortness of breath dyspnea   . Splenic cyst   . Stroke ([place]) 01/2012   Small cerebellar infarcts right greater than left as well as questionable acute left external capsule and caudate nuclear punctate lacunar infarcts noted per MRI (01/2012) - presumed to be embolic likely source PFO with right to left shunt (noted per TEE 01/ 2014)    Family History:  Mother with MI in 2s Brother and sister with HTN  Social History:  No alcohol in past 3 months Former smoker, quit 2008, 20 pack years Former cocaine, last ~2005  Review of Systems: A complete ROS was negative except as per HPI.  Physical Exam: Blood pressure 122/75, pulse 67, temperature 98.7 F (37.1 C), temperature source Oral, resp. rate 17, height 5\' 11"  (1.803 m), weight 275 lb (124.7 kg), SpO2 95 %.  Physical Exam  Constitutional: He is oriented to person, place, and time.  Obese man in no acute distress  HENT:  Head: Normocephalic and atraumatic.  Eyes: Conjunctivae are normal. No scleral icterus.  Neck: Normal range of motion. Neck supple.  Cardiovascular: Normal rate, regular rhythm and intact distal pulses.   Pulmonary/Chest: Effort normal.  Abdominal:  Obese, soft, nontender  Musculoskeletal: He exhibits no edema or tenderness.  Neurological: He is alert and oriented to person, place, and time.  Skin:  Bilateral lower extremities with extensive hyperpigmentation and dry scaling  Psychiatric: He has a normal mood and affect. His  behavior is normal.     CBC Latest Ref Rng & Units 12/17/2015 12/17/2015 08/28/2015  WBC 4.0 - 10.5 K/uL - 5.4 4.9  Hemoglobin 13.0 - 17.0 g/dL 17.0 15.8 15.2  Hematocrit 39.0 - 52.0 % 50.0 44.4 47.4  Platelets 150 - 400 K/uL - 168 184   BMP Latest Ref Rng & Units 12/17/2015 12/17/2015 10/28/2015  Glucose 65 - 99 mg/dL >700(HH) 999(HH) 108(H)  BUN 6 - 20 mg/dL 43(H) 33(H) 23(H)  Creatinine 0.61 - 1.24 mg/dL 1.40(H) 1.64(H) 1.17  BUN/Creat Ratio 10 - 22 - - -  Sodium 135 - 145 mmol/L 122(L) 122(L) 136  Potassium 3.5 - 5.1 mmol/L 5.6(H) 6.3(HH) 4.4  Chloride 101 - 111 mmol/L 93(L) 90(L) 106  CO2 22 -  32 mmol/L - 14(L) 23  Calcium 8.9 - 10.3 mg/dL - 10.4(H) 10.0   Initial corrected Na 136  Anion gap 18  VBG Component     Latest Ref Rng & Units 12/17/2015         9:35 AM  pH, Ven     7.250 - 7.430 7.406  pCO2, Ven     44.0 - 60.0 mmHg 38.1 (L)  pO2, Ven     32.0 - 45.0 mmHg 49.0 (H)  Bicarbonate     20.0 - 28.0 mmol/L 23.9  TCO2     0 - 100 mmol/L 25  O2 Saturation     % 85.0  Acid-base deficit     0.0 - 2.0 mmol/L 1.0  Patient temperature      HIDE  Sample type      VENOUS   Urinalysis    Component Value Date/Time   COLORURINE YELLOW 12/17/2015 0815   APPEARANCEUR CLOUDY (A) 12/17/2015 0815   LABSPEC 1.031 (H) 12/17/2015 0815   PHURINE 5.5 12/17/2015 0815   GLUCOSEU >1000 (A) 12/17/2015 0815   HGBUR NEGATIVE 12/17/2015 0815   BILIRUBINUR NEGATIVE 12/17/2015 0815   KETONESUR 15 (A) 12/17/2015 0815   PROTEINUR NEGATIVE 12/17/2015 0815   UROBILINOGEN 1.0 05/17/2014 1925   NITRITE NEGATIVE 12/17/2015 0815   LEUKOCYTESUR NEGATIVE 12/17/2015 0815   Lab Results  Component Value Date   HGBA1C 6.1 (H) 08/28/2015    EKG: NSR, borderline 1st degree AV block, wide QRS, biphasic T in V1, TWI in aVL.  Unchanged from prior.   Assessment & Plan by Problem: Active Problems:   DKA (diabetic ketoacidoses) ([place])   Diabetes mellitus without complication  ([place])  #Hyperglycemia #DKA/HHS Hyperglycemia with symptoms of dehydration from osmotic diuresis.  Questionable DKA or HHS with normal mental status, normal pH, but low bicarb.  Needs aggressive volume repletion, insulin drip, potassium repletion -Insulin drip -IVF resus with NS then D5 1/2 NS per DKA protocol -Serial BMPs -Transition to SubQ insulin when gap closed, able to eat, and glucose controlled  #CHF Clinically euvolemic.  Will need to volume resuscitate carefully with his HFrEF. -Hold carvedilol -Hold home torsemide, spironolactone -Continue home sacubitril-valsartan  #Schizophrenia Continue home seroquel  #Paroxysmal Atrial Fibrillation CHA2DS2-Vasc 5. Continue home rivaroxaban.  #HTN Continue home sacubitril-valsartan  Dispo: Admit patient to Observation with expected length of stay less than 2 midnights.  Signed: Minus Liberty, MD 12/17/2015, 12:53 PM  Pager: [phone]

## 2015-12-17 NOTE — ED Notes (Signed)
Pt's CBG 510.  Informed Elmo Putt, RN.

## 2015-12-17 NOTE — Progress Notes (Signed)
MD updated about pt's last BMP with Anion Gap-11, K-4.5, last CBG 178, MD stated to give 20 meq of K which is ordered, no further orders receivied

## 2015-12-17 NOTE — Progress Notes (Signed)
Received phone call from Dr. Juleen China with Internal Medicine. Was informed that pt anion gap was closing and that they would start pt on 15 units of lantus and give pt a diet order. Also received orders to discontinue insulin drip and dextrose in 2 hours after giving Lantus. Will continue to monitor.

## 2015-12-17 NOTE — ED Triage Notes (Signed)
Patient comes in with hx of DM type 2 with thirst, polyuria, hand cramping, nausea, and dizziness for last 6 days. Patient states he stopped taking his DM meds and hasn't checked his sugar for last 3 months. Patient a/ox4.

## 2015-12-17 NOTE — ED Notes (Addendum)
Water and ice given to pt per Estill Batten Investment banker, corporate)

## 2015-12-18 DIAGNOSIS — N182 Chronic kidney disease, stage 2 (mild): Secondary | ICD-10-CM | POA: Diagnosis not present

## 2015-12-18 DIAGNOSIS — I5042 Chronic combined systolic (congestive) and diastolic (congestive) heart failure: Secondary | ICD-10-CM | POA: Diagnosis not present

## 2015-12-18 DIAGNOSIS — Z9114 Patient's other noncompliance with medication regimen: Secondary | ICD-10-CM | POA: Diagnosis not present

## 2015-12-18 DIAGNOSIS — E1165 Type 2 diabetes mellitus with hyperglycemia: Secondary | ICD-10-CM

## 2015-12-18 DIAGNOSIS — K760 Fatty (change of) liver, not elsewhere classified: Secondary | ICD-10-CM | POA: Diagnosis not present

## 2015-12-18 DIAGNOSIS — E1110 Type 2 diabetes mellitus with ketoacidosis without coma: Secondary | ICD-10-CM | POA: Diagnosis not present

## 2015-12-18 DIAGNOSIS — E870 Hyperosmolality and hypernatremia: Secondary | ICD-10-CM | POA: Diagnosis not present

## 2015-12-18 DIAGNOSIS — K219 Gastro-esophageal reflux disease without esophagitis: Secondary | ICD-10-CM | POA: Diagnosis not present

## 2015-12-18 DIAGNOSIS — E1100 Type 2 diabetes mellitus with hyperosmolarity without nonketotic hyperglycemic-hyperosmolar coma (NKHHC): Secondary | ICD-10-CM | POA: Diagnosis not present

## 2015-12-18 DIAGNOSIS — I130 Hypertensive heart and chronic kidney disease with heart failure and stage 1 through stage 4 chronic kidney disease, or unspecified chronic kidney disease: Secondary | ICD-10-CM | POA: Diagnosis not present

## 2015-12-18 DIAGNOSIS — Z87891 Personal history of nicotine dependence: Secondary | ICD-10-CM | POA: Diagnosis not present

## 2015-12-18 DIAGNOSIS — E785 Hyperlipidemia, unspecified: Secondary | ICD-10-CM | POA: Diagnosis not present

## 2015-12-18 DIAGNOSIS — Z006 Encounter for examination for normal comparison and control in clinical research program: Secondary | ICD-10-CM

## 2015-12-18 DIAGNOSIS — Z8673 Personal history of transient ischemic attack (TIA), and cerebral infarction without residual deficits: Secondary | ICD-10-CM | POA: Diagnosis not present

## 2015-12-18 DIAGNOSIS — I480 Paroxysmal atrial fibrillation: Secondary | ICD-10-CM | POA: Diagnosis not present

## 2015-12-18 DIAGNOSIS — I429 Cardiomyopathy, unspecified: Secondary | ICD-10-CM | POA: Diagnosis not present

## 2015-12-18 LAB — BASIC METABOLIC PANEL
ANION GAP: 8 (ref 5–15)
Anion gap: 10 (ref 5–15)
Anion gap: 9 (ref 5–15)
BUN: 22 mg/dL — AB (ref 6–20)
BUN: 21 mg/dL — ABNORMAL HIGH (ref 6–20)
BUN: 23 mg/dL — AB (ref 6–20)
CALCIUM: 10.3 mg/dL (ref 8.9–10.3)
CO2: 20 mmol/L — ABNORMAL LOW (ref 22–32)
CO2: 23 mmol/L (ref 22–32)
CO2: 22 mmol/L (ref 22–32)
Calcium: 9.8 mg/dL (ref 8.9–10.3)
Calcium: 9.9 mg/dL (ref 8.9–10.3)
Chloride: 101 mmol/L (ref 101–111)
Chloride: 99 mmol/L — ABNORMAL LOW (ref 101–111)
Chloride: 99 mmol/L — ABNORMAL LOW (ref 101–111)
Creatinine, Ser: 1.04 mg/dL (ref 0.61–1.24)
Creatinine, Ser: 1.05 mg/dL (ref 0.61–1.24)
Creatinine, Ser: 1.17 mg/dL (ref 0.61–1.24)
GFR calc Af Amer: >60 mL/min (ref >60)
GFR calc Af Amer: >60 mL/min (ref >60)
GFR calc Af Amer: >60 mL/min (ref >60)
GFR calc non Af Amer: >60 mL/min (ref >60)
GLUCOSE: 257 mg/dL — AB (ref 65–99)
GLUCOSE: 369 mg/dL — AB (ref 65–99)
Glucose, Bld: 303 mg/dL — ABNORMAL HIGH (ref 65–99)
POTASSIUM: 4.2 mmol/L (ref 3.5–5.1)
POTASSIUM: 4.9 mmol/L (ref 3.5–5.1)
Potassium: 4.7 mmol/L (ref 3.5–5.1)
SODIUM: 129 mmol/L — AB (ref 135–145)
Sodium: 131 mmol/L — ABNORMAL LOW (ref 135–145)
Sodium: 131 mmol/L — ABNORMAL LOW (ref 135–145)

## 2015-12-18 LAB — GLUCOSE, CAPILLARY
GLUCOSE-CAPILLARY: 354 mg/dL — AB (ref 65–99)
GLUCOSE-CAPILLARY: 473 mg/dL — AB (ref 65–99)
GLUCOSE-CAPILLARY: 402 mg/dL — AB (ref 65–99)
Glucose-Capillary: 280 mg/dL — ABNORMAL HIGH (ref 65–99)
Glucose-Capillary: 434 mg/dL — ABNORMAL HIGH (ref 65–99)
Glucose-Capillary: 363 mg/dL — ABNORMAL HIGH (ref 65–99)
Glucose-Capillary: 437 mg/dL — ABNORMAL HIGH (ref 65–99)

## 2015-12-18 LAB — GLUCOSE, RANDOM: GLUCOSE: 406 mg/dL — AB (ref 65–99)

## 2015-12-18 MED ORDER — INSULIN GLARGINE 100 UNIT/ML ~~LOC~~ SOLN
15.0000 [IU] | Freq: Once | SUBCUTANEOUS | Status: AC
  Administered 2015-12-18: 15 [IU] via SUBCUTANEOUS
  Filled 2015-12-18: qty 0.15

## 2015-12-18 MED ORDER — INSULIN ASPART 100 UNIT/ML ~~LOC~~ SOLN
0.0000 [IU] | Freq: Every day | SUBCUTANEOUS | Status: DC
  Administered 2015-12-18: 6 [IU] via SUBCUTANEOUS

## 2015-12-18 MED ORDER — INSULIN ASPART 100 UNIT/ML ~~LOC~~ SOLN
0.0000 [IU] | SUBCUTANEOUS | Status: DC
  Administered 2015-12-18: 8 [IU] via SUBCUTANEOUS
  Administered 2015-12-18 (×2): 15 [IU] via SUBCUTANEOUS

## 2015-12-18 MED ORDER — INSULIN GLARGINE 100 UNIT/ML ~~LOC~~ SOLN
36.0000 [IU] | Freq: Every day | SUBCUTANEOUS | Status: DC
  Administered 2015-12-19: 36 [IU] via SUBCUTANEOUS
  Filled 2015-12-18: qty 0.36

## 2015-12-18 MED ORDER — INSULIN ASPART 100 UNIT/ML ~~LOC~~ SOLN
0.0000 [IU] | Freq: Three times a day (TID) | SUBCUTANEOUS | Status: DC
  Administered 2015-12-18 – 2015-12-19 (×2): 20 [IU] via SUBCUTANEOUS
  Administered 2015-12-19: 15 [IU] via SUBCUTANEOUS

## 2015-12-18 MED ORDER — INSULIN ASPART 100 UNIT/ML ~~LOC~~ SOLN
6.0000 [IU] | Freq: Three times a day (TID) | SUBCUTANEOUS | Status: DC
  Administered 2015-12-18 – 2015-12-19 (×3): 6 [IU] via SUBCUTANEOUS

## 2015-12-18 NOTE — Progress Notes (Unsigned)
Spoke to patient again today regarding BeAT-Heart Failure Research Study. He now verbalizes that he has not been taking his prescribed medications. He understands to participate in a research study one must adhere to physicians orders. Writer will contact patient in the future to see if he is medication compliant, and if he then wants to participate in research we can rescreen. Dr. Rayann Heman, Study PI and Dr. Trula Slade, Study Sub I made aware.

## 2015-12-18 NOTE — Progress Notes (Signed)
Pt ;inch time cbg 434. Md paged. Stat blood draw ordered

## 2015-12-18 NOTE — Progress Notes (Signed)
Inpatient Diabetes Program Recommendations  AACE/ADA: New Consensus Statement on Inpatient Glycemic Control (2015)  Target Ranges:  Prepandial:   less than 140 mg/dL      Peak postprandial:   less than 180 mg/dL (1-2 hours)      Critically ill patients:  140 - 180 mg/dL   Lab Results  Component Value Date   GLUCAP 354 (H) 12/18/2015   HGBA1C 6.1 (H) 08/28/2015    Review of Glycemic Control:  Results for [name] [name], [name] [name] (MRN [id number]) as of 12/18/2015 11:28  Ref. Range 12/17/2015 21:36 12/17/2015 22:32 12/17/2015 23:57 12/18/2015 04:46 12/18/2015 07:49  Glucose-Capillary Latest Ref Range: 65 - 99 mg/dL 164 (H) 220 (H) 277 (H) 280 (H) 354 (H)    Diabetes history: Type 2 diabetes Outpatient Diabetes medications: None prior to admit however according to MD's note, patient has been on Levemir in the past Current orders for Inpatient glycemic control:  Lantus 36 units daily, Novolog moderate q 4 hours  Inpatient Diabetes Program Recommendations:    Agree with current orders.  Patient will likely need to be d/c'd home on insulin. Will discuss with patient.   Thanks, Adah Perl, RN, BC-ADM Inpatient Diabetes Coordinator Pager [phone] (8a-5p)

## 2015-12-18 NOTE — Progress Notes (Signed)
Spoke with patient regarding likely need for insulin at discharge.  He states that in the past he used insulin pens and would prefer this again.  He was on 4 shot a day regimen prior to being taken off insulin.  Please consider adding Novolog meal coverage 6 units tid with meals as well.  Briefly discussed survival skills of diabetes.   Thanks, Adah Perl, RN, BC-ADM Inpatient Diabetes Coordinator Pager [phone] (8a-5p)

## 2015-12-18 NOTE — Progress Notes (Signed)
Internal Medicine Attending:   I saw and examined the patient. I reviewed the resident's note and I agree with the resident's findings and plan as documented in the resident's note.  60 year old man with chronic nonischemic systolic CHF with an ejection fraction of 30% was admitted with hyperosmotic hyperglycemic syndrome, managed with insulin infusion and IV fluid resuscitation. HHS was caused by poor compliance with home insulin regimen. Patient is doing well today, eating. No chest pain. Denies shortness of breath, orthopnea, PND. He received about 3 L of normal saline. Weight this morning was 265 pounds. Home weights are 266-268 pounds. Please hold further fluids, continue oral hydration. Hold diuretics for 1 more day. Advance diet today as able, titrate insulin for goal blood glucose under 200.

## 2015-12-18 NOTE — Progress Notes (Signed)
   Subjective: Feels better today. Tolerating diet without nausea or vomiting.   Objective:  Vital signs in last 24 hours: Vitals:   12/17/15 1828 12/17/15 2025 12/18/15 0425 12/18/15 0456  BP: (!) 123/96 (!) 150/92 116/84   Pulse: 76 77 94   Resp: 14 16 (!) 23   Temp:  98.6 F (37 C) 98.2 F (36.8 C)   TempSrc:  Oral Oral   SpO2: 97% 95% 93%   Weight:    265 lb 8 oz (120.4 kg)  Height:       General Apperance: NAD HEENT: Normocephalic, atraumatic, anicteric sclera Neck: Supple, trachea midline Lungs: Clear to auscultation bilaterally. No wheezes, rhonchi or rales. Breathing comfortably Heart: Regular rate and rhythm Abdomen: Soft, nontender, nondistended, no rebound/guarding Extremities: Warm and well perfused, no edema Skin: No rashes or lesions Neurologic: Alert and interactive. No gross deficits.   Assessment/Plan: 60 year old man with history of HTN, paroxysmal a fib, schizophrenia, chronic systolic CHF, DM2 presented with subacute generalized weakness, blurred vision, polydipsia, and polyuria found to have blood glucose of 999.  #HHS On chart review, he was previously on Levemir. He was taking 40 units and had decreased down to 10 units as of January 2017. Medication was taken off of his list in May. His last A1c was 6.1 on 8/2. BMP with closed gap x 2 yesterday evening. He was given 15 units of Lantus yesterday evening, and he was started on a carb mod diet. Insulin gtt was discontinued two hours after. Blood glucose 250 to 350 this morning. Gap remains closed and CO2 23.  -Continue carb mod diet -Lantus 15 units at 10am today. 36 units daily starting tomorrow. -SSI  #Chronic systolic CHF Remains euvolemic. -IV fluids discontinued. -Daily weights  #Schizophrenia -Continue home Seroquel  #Paroxysmal atrial fibrillation -Remains rate controlled. Continue home Xarelto  #HTN -Continue home Entresto -Holding carvedilol, torsemide, spironolactone    #GERD -Continue Protonix daily  FEN VTE ppx: Xarelto Code status: FULL  Dispo: Anticipated discharge in approximately 1 day(s).   Minus Liberty, MD 12/18/2015, 10:07 AM

## 2015-12-18 NOTE — Progress Notes (Signed)
Pt ate lunch, CBG 473. Rn will recheck CBG in 1 hour

## 2015-12-19 DIAGNOSIS — K219 Gastro-esophageal reflux disease without esophagitis: Secondary | ICD-10-CM | POA: Diagnosis not present

## 2015-12-19 DIAGNOSIS — Z9114 Patient's other noncompliance with medication regimen: Secondary | ICD-10-CM | POA: Diagnosis not present

## 2015-12-19 DIAGNOSIS — E1100 Type 2 diabetes mellitus with hyperosmolarity without nonketotic hyperglycemic-hyperosmolar coma (NKHHC): Secondary | ICD-10-CM | POA: Diagnosis not present

## 2015-12-19 DIAGNOSIS — I5042 Chronic combined systolic (congestive) and diastolic (congestive) heart failure: Secondary | ICD-10-CM | POA: Diagnosis not present

## 2015-12-19 DIAGNOSIS — E870 Hyperosmolality and hypernatremia: Secondary | ICD-10-CM | POA: Diagnosis not present

## 2015-12-19 DIAGNOSIS — N182 Chronic kidney disease, stage 2 (mild): Secondary | ICD-10-CM | POA: Diagnosis not present

## 2015-12-19 DIAGNOSIS — E1310 Other specified diabetes mellitus with ketoacidosis without coma: Secondary | ICD-10-CM | POA: Diagnosis not present

## 2015-12-19 DIAGNOSIS — E1165 Type 2 diabetes mellitus with hyperglycemia: Secondary | ICD-10-CM | POA: Diagnosis not present

## 2015-12-19 DIAGNOSIS — I429 Cardiomyopathy, unspecified: Secondary | ICD-10-CM | POA: Diagnosis not present

## 2015-12-19 DIAGNOSIS — E785 Hyperlipidemia, unspecified: Secondary | ICD-10-CM | POA: Diagnosis not present

## 2015-12-19 DIAGNOSIS — I480 Paroxysmal atrial fibrillation: Secondary | ICD-10-CM | POA: Diagnosis not present

## 2015-12-19 DIAGNOSIS — E1110 Type 2 diabetes mellitus with ketoacidosis without coma: Secondary | ICD-10-CM | POA: Diagnosis not present

## 2015-12-19 DIAGNOSIS — Z87891 Personal history of nicotine dependence: Secondary | ICD-10-CM | POA: Diagnosis not present

## 2015-12-19 DIAGNOSIS — I130 Hypertensive heart and chronic kidney disease with heart failure and stage 1 through stage 4 chronic kidney disease, or unspecified chronic kidney disease: Secondary | ICD-10-CM | POA: Diagnosis not present

## 2015-12-19 DIAGNOSIS — Z8673 Personal history of transient ischemic attack (TIA), and cerebral infarction without residual deficits: Secondary | ICD-10-CM | POA: Diagnosis not present

## 2015-12-19 DIAGNOSIS — K760 Fatty (change of) liver, not elsewhere classified: Secondary | ICD-10-CM | POA: Diagnosis not present

## 2015-12-19 LAB — BASIC METABOLIC PANEL
ANION GAP: 5 (ref 5–15)
Anion gap: 9 (ref 5–15)
BUN: 17 mg/dL (ref 6–20)
BUN: 18 mg/dL (ref 6–20)
CALCIUM: 10.2 mg/dL (ref 8.9–10.3)
CO2: 21 mmol/L — ABNORMAL LOW (ref 22–32)
CO2: 22 mmol/L (ref 22–32)
Calcium: 10.3 mg/dL (ref 8.9–10.3)
Chloride: 102 mmol/L (ref 101–111)
Chloride: 101 mmol/L (ref 101–111)
Creatinine, Ser: 1.04 mg/dL (ref 0.61–1.24)
Creatinine, Ser: 0.93 mg/dL (ref 0.61–1.24)
GFR calc Af Amer: >60 mL/min (ref >60)
GLUCOSE: 307 mg/dL — AB (ref 65–99)
GLUCOSE: 435 mg/dL — AB (ref 65–99)
POTASSIUM: 4.9 mmol/L (ref 3.5–5.1)
Potassium: 4.2 mmol/L (ref 3.5–5.1)
Sodium: 132 mmol/L — ABNORMAL LOW (ref 135–145)
Sodium: 128 mmol/L — ABNORMAL LOW (ref 135–145)

## 2015-12-19 LAB — GLUCOSE, CAPILLARY
Glucose-Capillary: 305 mg/dL — ABNORMAL HIGH (ref 65–99)
Glucose-Capillary: 346 mg/dL — ABNORMAL HIGH (ref 65–99)
Glucose-Capillary: 477 mg/dL — ABNORMAL HIGH (ref 65–99)

## 2015-12-19 MED ORDER — INSULIN GLARGINE 100 UNIT/ML SOLOSTAR PEN: 36.0000 [IU] | PEN_INJECTOR | Freq: Every day | SUBCUTANEOUS | 11 refills | Status: DC

## 2015-12-19 MED ORDER — ONETOUCH DELICA LANCETS 33G MISC: 1.0000 | Freq: Every day | 11 refills | Status: DC

## 2015-12-19 MED ORDER — INSULIN LISPRO 100 UNIT/ML (KWIKPEN): 10.0000 [IU] | PEN_INJECTOR | Freq: Three times a day (TID) | SUBCUTANEOUS | 11 refills | Status: DC

## 2015-12-19 MED ORDER — INSULIN GLARGINE 100 UNIT/ML SOLOSTAR PEN: 40.0000 [IU] | PEN_INJECTOR | Freq: Every day | SUBCUTANEOUS | 11 refills | Status: DC

## 2015-12-19 MED ORDER — INSULIN LISPRO 100 UNIT/ML (KWIKPEN): 6.0000 [IU] | PEN_INJECTOR | Freq: Three times a day (TID) | SUBCUTANEOUS | 11 refills | Status: DC

## 2015-12-19 MED ORDER — INSULIN PEN NEEDLE 32G X 5 MM MISC: 1.0000 | 11 refills | Status: DC | PRN

## 2015-12-19 MED ORDER — GLUCOSE BLOOD VI STRP: ORAL_STRIP | 12 refills | Status: DC

## 2015-12-19 MED ORDER — ONETOUCH ULTRA MINI W/DEVICE KIT: 1.0000 | PACK | 0 refills | Status: DC | PRN

## 2015-12-19 NOTE — Progress Notes (Signed)
CBG 477.  MD aware. New instructions given. Gave 20 units of insulin and meal coverage per MD order. Holding discharge until B MET results. Will cont to monitor pt.

## 2015-12-19 NOTE — Progress Notes (Signed)
   Subjective: Eating well, no nausea of vomiting.  Ready for discharge, knows how to use insulin.  Sister can drive him to [facility], which is open today, to pick up his prescriptions.  Objective:  Vital signs in last 24 hours: Vitals:   12/19/15 0030 12/19/15 0607 12/19/15 0751 12/19/15 0754  BP: 120/72 113/75 112/81   Pulse: 82   80  Resp: (!) 29 (!) 23 (!) 22 19  Temp: 98.3 F (36.8 C) 98.2 F (36.8 C)  98.4 F (36.9 C)  TempSrc: Oral Oral  Oral  SpO2: 95% 96%  97%  Weight:  264 lb 12.8 oz (120.1 kg)    Height:       General Apperance: NAD HEENT: Normocephalic, atraumatic, anicteric sclera Neck: Supple, trachea midline Lungs: Clear to auscultation bilaterally. No wheezes, rhonchi or rales. Breathing comfortably Heart: Regular rate and rhythm Abdomen: Soft, nontender, nondistended, no rebound/guarding Extremities: Warm and well perfused, no edema Skin: No rashes or lesions Neurologic: Alert and interactive. No gross deficits.   Assessment/Plan: 60 year old man with history of HTN, paroxysmal a fib, schizophrenia, chronic systolic CHF, DM2 presented with subacute generalized weakness, blurred vision, polydipsia, and polyuria found to have blood glucose of 999.  His HHS resolved with insulin and fluids and he is ready for discharge with prescription for home insulin.  #HHS On chart review, he was previously on Levemir. He was taking 40 units and had decreased down to 10 units as of January 2017. Medication was taken off of his list in May. His last A1c was 6.1 on 8/2. BMP with closed gap x 2 yesterday evening. He was given 15 units of Lantus yesterday evening, and he was started on a carb mod diet. Insulin gtt was discontinued two hours after. Blood glucose has been in 300s.  -Continue carb mod diet -Lantus 40U daily -SSI  #Chronic systolic CHF Remains euvolemic. -IV fluids discontinued. -Daily weights  #Schizophrenia -Continue home Seroquel  #Paroxysmal atrial  fibrillation -Continue home Xarelto  #HTN -Continue home Entresto -Holding carvedilol, torsemide, spironolactone   #GERD -Continue Protonix daily  FEN VTE ppx: Xarelto Code status: FULL  Dispo: Anticipated discharge today.  Minus Liberty, MD 12/19/2015, 10:23 AM

## 2015-12-19 NOTE — Progress Notes (Signed)
  Date: 12/19/2015  Patient name: [name] [name]  Medical record number: [id number]  Date of birth: Dec 23, 1955   I have personally seen and evaluated this patient and the plan of care was discussed with the house staff. Please see Dr. Rowe Pavy note for complete details. I concur with his findings.   Patient did well overnight, no sign of volume overload on exam.  Medications are obtainable today from pharmacy.  Discharge as per resident note.   Sid Falcon, MD 12/19/2015, 1:21 PM

## 2015-12-19 NOTE — Progress Notes (Signed)
Clarified with Md okay to discharge.

## 2015-12-19 NOTE — Discharge Instructions (Addendum)
Diabetes Mellitus and Exercise Exercising regularly is important for your overall health, especially when you have diabetes (diabetes mellitus). Exercising is not only about losing weight. It has many health benefits, such as increasing muscle strength and bone density and reducing body fat and stress. This leads to improved fitness, flexibility, and endurance, all of which result in better overall health. Exercise has additional benefits for people with diabetes, including:  Reducing appetite.  Helping to lower and control blood glucose.  Lowering blood pressure.  Helping to control amounts of fatty substances (lipids) in the blood, such as cholesterol and triglycerides.  Helping the body to respond better to insulin (improving insulin sensitivity).  Reducing how much insulin the body needs.  Decreasing the risk for heart disease by:  Lowering cholesterol and triglyceride levels.  Increasing the levels of good cholesterol.  Lowering blood glucose levels. What is my activity plan? Your health care provider or certified diabetes educator can help you make a plan for the type and frequency of exercise (activity plan) that works for you. Make sure that you:  Do at least 150 minutes of moderate-intensity or vigorous-intensity exercise each week. This could be brisk walking, biking, or water aerobics.  Do stretching and strength exercises, such as yoga or weightlifting, at least 2 times a week.  Spread out your activity over at least 3 days of the week.  Get some form of physical activity every day.  Do not go more than 2 days in a row without some kind of physical activity.  Avoid being inactive for more than 90 minutes at a time. Take frequent breaks to walk or stretch.  Choose a type of exercise or activity that you enjoy, and set realistic goals.  Start slowly, and gradually increase the intensity of your exercise over time. What do I need to know about managing my  diabetes?  Check your blood glucose before and after exercising.  If your blood glucose is higher than 240 mg/dL (13.3 mmol/L) before you exercise, check your urine for ketones. If you have ketones in your urine, do not exercise until your blood glucose returns to normal.  Know the symptoms of low blood glucose (hypoglycemia) and how to treat it. Your risk for hypoglycemia increases during and after exercise. Common symptoms of hypoglycemia can include:  Hunger.  Anxiety.  Sweating and feeling clammy.  Confusion.  Dizziness or feeling light-headed.  Increased heart rate or palpitations.  Blurry vision.  Tingling or numbness around the mouth, lips, or tongue.  Tremors or shakes.  Irritability.  Keep a rapid-acting carbohydrate snack available before, during, and after exercise to help prevent or treat hypoglycemia.  Avoid injecting insulin into areas of the body that are going to be exercised. For example, avoid injecting insulin into:  The arms, when playing tennis.  The legs, when jogging.  Keep records of your exercise habits. Doing this can help you and your health care provider adjust your diabetes management plan as needed. Write down:  Food that you eat before and after you exercise.  Blood glucose levels before and after you exercise.  The type and amount of exercise you have done.  When your insulin is expected to peak, if you use insulin. Avoid exercising at times when your insulin is peaking.  When you start a new exercise or activity, work with your health care provider to make sure the activity is safe for you, and to adjust your insulin, medicines, or food intake as needed.  Drink plenty of water while you exercise to prevent dehydration or heat stroke. Drink enough fluid to keep your urine clear or pale yellow. This information is not intended to replace advice given to you by your health care provider. Make sure you discuss any questions you have with  your health care provider. Document Released: 04/04/2003 Document Revised: 08/02/2015 Document Reviewed: 06/24/2015 Elsevier Interactive Patient Education  2017 [employer].   Diabetes Mellitus and Sick Day Management Blood sugar (glucose) can be difficult to control when you are sick. Common illnesses that can cause problems for people with diabetes (diabetes mellitus) include colds, fever, flu (influenza), nausea, vomiting, and diarrhea. These illnesses can cause stress and loss of body fluids (dehydration), and those issues can cause blood glucose levels to increase. Because of this, it is very important to take your insulin and diabetes medicines and eat some form of carbohydrate when you are sick. You should make a plan for days when you are sick (sick day plan) as part of your diabetes management plan. You and your health care provider should make this plan in advance. The following guidelines are intended to help you manage an illness that lasts for about 24 hours or less. Your health care provider may also give you more specific instructions. What do I need to do to manage my blood glucose?  Check your blood glucose every 2-4 hours, or as often as told by your health care provider.  Know your sick day treatment goals. Your target blood glucose levels may be different when you are sick.  If you use insulin, take your usual dose.  If your blood glucose continues to be too high, you may need to take an additional insulin dose as told by your health care provider.  If you use oral diabetes medicine, you may need to stop taking it if you are not able to eat or drink normally. Ask your health care provider about whether you need to stop taking these medicines while you are sick.  If you use injectable hormone medicines other than insulin to control your diabetes, ask your health care provider about whether you need to stop taking these medicines while you are sick. What else can I do to  manage my diabetes when I am sick? Check your ketones  If you have type 1 diabetes, check your urine ketones every 4 hours.  If you have type 2 diabetes, check your urine ketones as often as told by your health care provider. Drink fluids  Drink enough fluid to keep your urine clear or pale yellow. This is especially important if you have a fever, vomiting, or diarrhea. Those symptoms can lead to dehydration.  Follow any instructions from your health care provider about beverages to avoid.  Do not drink alcohol, caffeine, or drinks that contain a lot of sugar. Take medicines as directed  Take-over-the-counter and prescription medicines only as told by your health care provider.  Check medicine labels for added sugars. Some medicines may contain sugar or types of sugars that can raise your blood glucose level. What foods can I eat when I am sick? You need to eat some form of carbohydrates when you are sick. You should eat 45-50 grams (45-50 g) of carbohydrates every 3-4 hours until you feel better. All of the food choices below contain about 15 g of carbohydrates. Plan ahead and keep some of these foods around so you have them if you get sick.  4-6 oz (120-177 mL) carbonated beverage  that contains sugar, such as regular (not diet) soda. You may be able to drink carbonated beverages more easily if you open the beverage and let it sit at room temperature for a few minutes before drinking.   of a twin frozen ice pop.  4 oz (120 g) regular gelatin.  4 oz (120 mL) fruit juice.  4 oz (120 g) ice cream or frozen yogurt.  2 oz (60 g) sherbet.  8 oz (240 mL) clear broth or soup.  4 oz (120 g) regular custard.  4 oz (120 g) regular pudding.  8 oz (240 g) plain yogurt.  1 slice bread or toast.  6 saltine crackers.  5 vanilla wafers. Questions to ask your health care provider Consider asking the following questions so you know what to do on days when you are sick:  Should I  adjust my diabetes medicines?  How often do I need to check my blood glucose?  What supplies do I need to manage my diabetes at home when I am sick?  What number can I call if I have questions?  What foods and drinks should I avoid? Contact a health care provider if:  You develop symptoms of diabetic ketoacidosis, such as:  Fatigue.  Weight loss.  Excessive thirst.  Light-headedness.  Fruity or sweet-smelling breath.  Excessive urination.  Vision changes.  Confusion or irritability.  Nausea.  Vomiting.  Rapid breathing.  Pain in the abdomen.  Feeling flushed.  You are unable to drink fluids without vomiting.  You have any of the following for more than 6 hours:  Nausea.  Vomiting.  Diarrhea.  Your blood glucose is at or above 240 mg/dL (13.3 mmol/L), even after you take an additional insulin dose.  You have a change in how you think, feel, or act (mental status).  You develop another serious illness.  You have been sick or have had a fever for 2 days or longer and you are not getting better. Get help right away if:  Your blood glucose is lower than 54 mg/dL (3.0 mmol/L).  You have difficulty breathing.  You have moderate or high ketone levels in your urine.  You used emergency glucagon to treat low blood glucose. Summary  Blood sugar (glucose) can be difficult to control when you are sick. Common illnesses that can cause problems for people with diabetes (diabetes mellitus) include colds, fever, flu (influenza), nausea, vomiting, and diarrhea.  Illnesses can cause stress and loss of body fluids (dehydration), and those issues can cause blood glucose levels to increase.  Make a plan for days when you are sick (sick day plan) as part of your diabetes management plan. You and your health care provider should make this plan in advance.  It is very important to take your insulin and diabetes medicines and to eat some form of carbohydrate when you  are sick.  Contact your health care provider if have problems managing your blood glucose levels when you are sick, or if you have been sick or had a fever for 2 days or longer and are not getting better. This information is not intended to replace advice given to you by your health care provider. Make sure you discuss any questions you have with your health care provider. Document Released: 01/15/2003 Document Revised: 10/11/2015 Document Reviewed: 10/11/2015 Elsevier Interactive Patient Education  2017 [employer]  Diabetes Mellitus and Food It is important for you to manage your blood sugar (glucose) level. Your blood glucose level can be  greatly affected by what you eat. Eating healthier foods in the appropriate amounts throughout the day at about the same time each day will help you control your blood glucose level. It can also help slow or prevent worsening of your diabetes mellitus. Healthy eating may even help you improve the level of your blood pressure and reach or maintain a healthy weight. General recommendations for healthful eating and cooking habits include:  Eating meals and snacks regularly. Avoid going long periods of time without eating to lose weight.  Eating a diet that consists mainly of plant-based foods, such as fruits, vegetables, nuts, legumes, and whole grains.  Using low-heat cooking methods, such as baking, instead of high-heat cooking methods, such as deep frying. Work with your dietitian to make sure you understand how to use the Nutrition Facts information on food labels. How can food affect me? Carbohydrates  Carbohydrates affect your blood glucose level more than any other type of food. Your dietitian will help you determine how many carbohydrates to eat at each meal and teach you how to count carbohydrates. Counting carbohydrates is important to keep your blood glucose at a healthy level, especially if you are using insulin or taking certain medicines for  diabetes mellitus. Alcohol  Alcohol can cause sudden decreases in blood glucose (hypoglycemia), especially if you use insulin or take certain medicines for diabetes mellitus. Hypoglycemia can be a life-threatening condition. Symptoms of hypoglycemia (sleepiness, dizziness, and disorientation) are similar to symptoms of having too much alcohol. If your health care provider has given you approval to drink alcohol, do so in moderation and use the following guidelines:  Women should not have more than one drink per day, and men should not have more than two drinks per day. One drink is equal to:  12 oz of beer.  5 oz of wine.  1 oz of hard liquor.  Do not drink on an empty stomach.  Keep yourself hydrated. Have water, diet soda, or unsweetened iced tea.  Regular soda, juice, and other mixers might contain a lot of carbohydrates and should be counted. What foods are not recommended? As you make food choices, it is important to remember that all foods are not the same. Some foods have fewer nutrients per serving than other foods, even though they might have the same number of calories or carbohydrates. It is difficult to get your body what it needs when you eat foods with fewer nutrients. Examples of foods that you should avoid that are high in calories and carbohydrates but low in nutrients include:  Trans fats (most processed foods list trans fats on the Nutrition Facts label).  Regular soda.  Juice.  Candy.  Sweets, such as cake, pie, doughnuts, and cookies.  Fried foods. What foods can I eat? Eat nutrient-rich foods, which will nourish your body and keep you healthy. The food you should eat also will depend on several factors, including:  The calories you need.  The medicines you take.  Your weight.  Your blood glucose level.  Your blood pressure level.  Your cholesterol level. You should eat a variety of foods, including:  Protein.  Lean cuts of meat.  Proteins low  in saturated fats, such as fish, egg whites, and beans. Avoid processed meats.  Fruits and vegetables.  Fruits and vegetables that may help control blood glucose levels, such as apples, mangoes, and yams.  Dairy products.  Choose fat-free or low-fat dairy products, such as milk, yogurt, and cheese.  Grains, bread, pasta,  and rice.  Choose whole grain products, such as multigrain bread, whole oats, and brown rice. These foods may help control blood pressure.  Fats.  Foods containing healthful fats, such as nuts, avocado, olive oil, canola oil, and fish. Does everyone with diabetes mellitus have the same meal plan? Because every person with diabetes mellitus is different, there is not one meal plan that works for everyone. It is very important that you meet with a dietitian who will help you create a meal plan that is just right for you. This information is not intended to replace advice given to you by your health care provider. Make sure you discuss any questions you have with your health care provider. Document Released: 10/09/2004 Document Revised: 06/20/2015 Document Reviewed: 12/09/2012 Elsevier Interactive Patient Education  2017 [place] were admitted to the hospital because your blood sugars have gotten out of control again.  You will need to start taking insulin again like you used to.  I have prescribed you 40U Lantus to inject at night starting tomorrow night.  You will also need to inect 10U of Humalog before breakfast, lunch, and dinner.  If you are not going to eat a meal, don't use the Humalog.  If you use the Humalog, you should make sure to have a meal afterwards.  Please check your blood sugars first thing in the morning and once or twice during the day before meals.  Bring your meter to every clinic appointment.  Please make sure you have a [facility]up [facility] before the end of next week.  They should call you to schedule an  appointment, but if you don't hear from them by Monday please call.   Hypoglycemia  Hypoglycemia is when the sugar (glucose) level in the blood is too low. Symptoms of low blood sugar may include:  Feeling:  Hungry.  Worried or nervous (anxious).  Sweaty and clammy.  Confused.  Dizzy.  Sleepy.  Sick to your stomach (nauseous).  Having:  A fast heartbeat.  A headache.  A change in your vision.  Jerky movements that you cannot control (seizure).  Nightmares.  Tingling or no feeling (numbness) around the mouth, lips, or tongue.  Having trouble with:  Talking.  Paying attention (concentrating).  Moving (coordination).  Sleeping.  Shaking.  Passing out (fainting).  Getting upset easily (irritability). Low blood sugar can happen to people who have diabetes and people who do not have diabetes. Low blood sugar can happen quickly, and it can be an emergency. Treating Low Blood Sugar  Low blood sugar is often treated by eating or drinking something sugary right away. If you can think clearly and swallow safely, follow the 15:15 rule:  Take 15 grams of a fast-acting carb (carbohydrate). Some fast-acting carbs are:  1 tube of glucose gel.  3 sugar tablets (glucose pills).  6-8 pieces of hard candy.  4 oz (120 mL) of fruit juice.  4 oz (120 mL) of regular (not diet) soda.  Check your blood sugar 15 minutes after you take the carb.  If your blood sugar is still at or below 70 mg/dL (3.9 mmol/L), take 15 grams of a carb again.  If your blood sugar does not go above 70 mg/dL (3.9 mmol/L) after 3 tries, get help right away.  After your blood sugar goes back to normal, eat a meal or a snack within 1 hour. Treating Very Low Blood Sugar  If your blood sugar is at or below  54 mg/dL (3 mmol/L), you have very low blood sugar (severe hypoglycemia). This is an emergency. Do not wait to see if the symptoms will go away. Get medical help right away. Call your local  emergency services (911 in the [place] Do not drive yourself to the hospital. If you have very low blood sugar and you cannot eat or drink, you may need a glucagon shot (injection). A family member or friend should learn how to check your blood sugar and how to give you a glucagon shot. Ask your doctor if you need to have a glucagon shot kit at home. Follow these instructions at home: General instructions  Avoid any diets that cause you to not eat enough food. Talk with your doctor before you start any new diet.  Take over-the-counter and prescription medicines only as told by your doctor.  Limit alcohol to no more than 1 drink per day for nonpregnant women and 2 drinks per day for men. One drink equals 12 oz of beer, 5 oz of wine, or 1 oz of hard liquor.  Keep all follow-up visits as told by your doctor. This is important. If You Have Diabetes:   Make sure you know the symptoms of low blood sugar.  Always keep a source of sugar with you, such as:  Sugar.  Sugar tablets.  Glucose gel.  Fruit juice.  Regular soda (not diet soda).  Milk.  Hard candy.  Honey.  Take your medicines as told.  Follow your exercise and meal plan.  Eat on time. Do not skip meals.  Follow your sick day plan when you cannot eat or drink normally. Make this plan ahead of time with your doctor.  Check your blood sugar as often as told by your doctor. Always check before and after exercise.  Share your diabetes care plan with:  Your work or school.  People you live with.  Check your pee (urine) for ketones:  When you are sick.  As told by your doctor.  Carry a card or wear jewelry that says you have diabetes. If You Have Low Blood Sugar From Other Causes:   Check your blood sugar as often as told by your doctor.  Follow instructions from your doctor about what you cannot eat or drink. Contact a doctor if:  You have trouble keeping your blood sugar in your target range.  You have  low blood sugar often. Get help right away if:  You still have symptoms after you eat or drink something sugary.  Your blood sugar is at or below 54 mg/dL (3 mmol/L).  You have jerky movements that you cannot control.  You pass out. These symptoms may be an emergency. Do not wait to see if the symptoms will go away. Get medical help right away. Call your local emergency services (911 in the [place] Do not drive yourself to the hospital.  This information is not intended to replace advice given to you by your health care provider. Make sure you discuss any questions you have with your health care provider. Document Released: 04/08/2009 Document Revised: 06/20/2015 Document Reviewed: 02/15/2015 Elsevier Interactive Patient Education  2017 [employer].

## 2015-12-19 NOTE — Progress Notes (Signed)
Discharge instructions reviewed with patient. Pt has no questions at this time. Pt succesfully demonstrated how to use the insulin pen using the teaching kit. IV DC.

## 2015-12-19 NOTE — Discharge Summary (Signed)
Name: [name] [name] MRN: [id number] DOB: Jul 31, 1955 60 y.o. PCP: Aldine Contes, MD  Date of Admission: 12/17/2015  8:16 AM Date of Discharge: 12/19/2015 Attending Physician: Axel Filler, MD  Discharge Diagnosis: 1. Hyperosmotic Hyperglycemic State 2. Insulin-dependent Diabetes Mellitus  Active Problems:   Paroxysmal atrial fibrillation (HCC)   DKA (diabetic ketoacidoses) ([place])   Diabetes mellitus without complication ([place])   Discharge Medications:   Medication List    TAKE these medications   ACCU-CHEK NANO SMARTVIEW w/Device Kit Use as instructed to test four times a day. DX: 250.92, insulin requiring What changed:  Another medication with the same name was added. Make sure you understand how and when to take each.   ONE TOUCH ULTRA MINI w/Device Kit 1 each by Does not apply route as needed. What changed:  You were already taking a medication with the same name, and this prescription was added. Make sure you understand how and when to take each.   acetaminophen 500 MG tablet Commonly known as:  TYLENOL Take 1,000 mg by mouth every 8 (eight) hours as needed for mild pain.   carvedilol 6.25 MG tablet Commonly known as:  COREG Take 6.25 mg by mouth 2 (two) times daily with a meal.   glucose blood test strip Commonly known as:  ONE TOUCH ULTRA TEST Use as instructed   HYDROcodone-acetaminophen 10-325 MG tablet Commonly known as:  NORCO Take 1 tablet by mouth every 8 (eight) hours as needed.   Insulin Glargine 100 UNIT/ML Solostar Pen Commonly known as:  LANTUS Inject 40 Units into the skin daily at 10 pm. Start taking on:  12/20/2015   insulin lispro 100 UNIT/ML KiwkPen Commonly known as:  HUMALOG Inject 0.1 mLs (10 Units total) into the skin 3 (three) times daily before meals.   Insulin Pen Needle 32G X 5 MM Misc Commonly known as:  CAREFINE PEN NEEDLES 1 each by Does not apply route as needed.   ONETOUCH DELICA LANCETS 16W Misc 1 each by  Does not apply route daily before breakfast.   pantoprazole 40 MG tablet Commonly known as:  PROTONIX take 1 tablet by mouth once daily   Potassium Chloride ER 20 MEQ Tbcr Take 20 mEq by mouth daily.   QUEtiapine 400 MG tablet Commonly known as:  SEROQUEL Take 400 mg by mouth at bedtime.   rivaroxaban 20 MG Tabs tablet Commonly known as:  XARELTO Take 1 tablet (20 mg total) by mouth daily.   sacubitril-valsartan 49-51 MG Commonly known as:  ENTRESTO Take 1 tablet by mouth 2 (two) times daily.   spironolactone 25 MG tablet Commonly known as:  ALDACTONE Take 1 tablet (25 mg total) by mouth daily.   torsemide 20 MG tablet Commonly known as:  DEMADEX Take 3 tablets (60 mg total) by mouth daily. What changed:  how much to take       Disposition and follow-up:   [name] [name] was discharged from [facility] in Stable condition.  At the hospital follow up visit please address:  1.  Diabetes.  Please address insulin regimen and manage hypoglycemics as indicated.  2.  CHF.  Reports recent poor compliance with diuretic regimen.  Assess volume status, encourage taking meds as prescribed.  2.  Labs / imaging needed at time of follow-up: CBG, HgbA1c  3.  Pending labs/ test needing follow-up: none  Follow-up Appointments: Follow-up Information    [place]. Schedule an appointment as soon as possible for  a visit in 1 week(s).   Why:  They will call you to make an appointment for next week.  If you do not hear from them by Monday, please call to make an appointment. Contact information: [address] [place] [facility] Course by problem list: Active Problems:   Paroxysmal atrial fibrillation ([place])   DKA (diabetic ketoacidoses) ([place])   Diabetes mellitus without complication ([place])   1. HHS 2. DM2 [name] [name] presented with one week of worsening malaise, blurry vision,  polyuria, and polydipsia.  In 01/2015, his diabetes was well controlled and he was instructed to discontinue mealtime insulin and continue his basal insulin.  Since then he has not taken any insulin and his diet has worsened, consuming lots of carbohydrate-laden foods and drinks.  His glucose was 999 on admission with mild metabolic acidosis consistent with HHS.  His blood sugars were initially managed with insulin infusion, then transitioned to subcutaneous basal-bolus regimen.  His BGs decreased to 300s-400s and anion gap remained closed.  He was discharged with prescription for 40U Lantus at night and 10U Humalog TID with meals and instructed to follow up with [facility] in one week.  3. CHF Euvolemic.  His carvedilol, torsemide, and spironolactone were held with concern for dehydration.  He reports not taking torsemide for a few weeks, becoming more short of breath, resuming his torsemide and his dyspnea resolving in the month prior to admission.  4. Schizophrenia Normal mental status.  Continued on home seroquel.  5. Paroxysmal Atrial Fibrillation Continued on home rivaroxaban  Discharge Vitals:   BP 112/81   Pulse 80   Temp 98.4 F (36.9 C) (Oral)   Resp 19   Ht '5\' 11"'$  (1.803 m)   Wt 264 lb 12.8 oz (120.1 kg)   SpO2 97%   BMI 36.93 kg/m   Pertinent Labs, Studies, and Procedures:   CBC Latest Ref Rng & Units 12/17/2015 12/17/2015 08/28/2015  WBC 4.0 - 10.5 K/uL - 5.4 4.9  Hemoglobin 13.0 - 17.0 g/dL 17.0 15.8 15.2  Hematocrit 39.0 - 52.0 % 50.0 44.4 47.4  Platelets 150 - 400 K/uL - 168 184   BMP Latest Ref Rng & Units 12/19/2015 12/19/2015 12/18/2015  Glucose 65 - 99 mg/dL 435(H) 307(H) 369(H)  BUN 6 - 20 mg/dL 18 17 23(H)  Creatinine 0.61 - 1.24 mg/dL 0.93 1.04 1.17  BUN/Creat Ratio 10 - 22 - - -  Sodium 135 - 145 mmol/L 128(L) 132(L) 129(L)  Potassium 3.5 - 5.1 mmol/L 4.9 4.2 4.9  Chloride 101 - 111 mmol/L 101 102 99(L)  CO2 22 - 32 mmol/L 22 21(L) 22  Calcium 8.9 - 10.3 mg/dL  10.3 10.2 10.3   Component     Latest Ref Rng & Units 12/17/2015         9:35 AM  pH, Ven     7.250 - 7.430 7.406  pCO2, Ven     44.0 - 60.0 mmHg 38.1 (L)  pO2, Ven     32.0 - 45.0 mmHg 49.0 (H)  Bicarbonate     20.0 - 28.0 mmol/L 23.9  TCO2     0 - 100 mmol/L 25  O2 Saturation     % 85.0  Acid-base deficit     0.0 - 2.0 mmol/L 1.0  Patient temperature      HIDE  Sample type      VENOUS     Discharge Instructions: Discharge  Instructions    Diet - low sodium heart healthy    Complete by:  As directed    Increase activity slowly    Complete by:  As directed      You were admitted to the hospital because your blood sugars have gotten out of control again.  You will need to start taking insulin again like you used to.  I have prescribed you 40U Lantus to inject at night starting tomorrow night.  You will also need to inect 6U of Humalog before breakfast, lunch, and dinner.  If you are not going to eat a meal, don't use the Humalog.  If you use the Humalog, you should make sure to have a meal afterwards.  Please check your blood sugars first thing in the morning and once or twice during the day before meals.  Bring your meter to every clinic appointment.  Please make sure you have a follow-up appointment at the [facility] before the end of next week.  They should call you to schedule an appointment, but if you don't hear from them by Monday please call.  Signed: Minus Liberty, MD 12/19/2015, 11:46 AM   Pager: [phone]

## 2015-12-20 ENCOUNTER — Telehealth: Payer: Self-pay | Admitting: Internal Medicine

## 2015-12-20 ENCOUNTER — Encounter (HOSPITAL_COMMUNITY): Payer: Self-pay | Admitting: Emergency Medicine

## 2015-12-20 DIAGNOSIS — Z8546 Personal history of malignant neoplasm of prostate: Secondary | ICD-10-CM | POA: Diagnosis not present

## 2015-12-20 DIAGNOSIS — Z7901 Long term (current) use of anticoagulants: Secondary | ICD-10-CM | POA: Diagnosis not present

## 2015-12-20 DIAGNOSIS — I130 Hypertensive heart and chronic kidney disease with heart failure and stage 1 through stage 4 chronic kidney disease, or unspecified chronic kidney disease: Secondary | ICD-10-CM | POA: Diagnosis not present

## 2015-12-20 DIAGNOSIS — N182 Chronic kidney disease, stage 2 (mild): Secondary | ICD-10-CM | POA: Diagnosis not present

## 2015-12-20 DIAGNOSIS — E1122 Type 2 diabetes mellitus with diabetic chronic kidney disease: Secondary | ICD-10-CM | POA: Diagnosis not present

## 2015-12-20 DIAGNOSIS — Z87891 Personal history of nicotine dependence: Secondary | ICD-10-CM | POA: Insufficient documentation

## 2015-12-20 DIAGNOSIS — Z794 Long term (current) use of insulin: Secondary | ICD-10-CM | POA: Insufficient documentation

## 2015-12-20 DIAGNOSIS — I5042 Chronic combined systolic (congestive) and diastolic (congestive) heart failure: Secondary | ICD-10-CM | POA: Diagnosis not present

## 2015-12-20 DIAGNOSIS — E1165 Type 2 diabetes mellitus with hyperglycemia: Secondary | ICD-10-CM | POA: Diagnosis not present

## 2015-12-20 DIAGNOSIS — Z8673 Personal history of transient ischemic attack (TIA), and cerebral infarction without residual deficits: Secondary | ICD-10-CM | POA: Diagnosis not present

## 2015-12-20 LAB — CBC
HEMATOCRIT: 40.7 % (ref 39.0–52.0)
HEMOGLOBIN: 14.3 g/dL (ref 13.0–17.0)
MCH: 31.6 pg (ref 26.0–34.0)
MCHC: 35.1 g/dL (ref 30.0–36.0)
MCV: 89.8 fL (ref 78.0–100.0)
Platelets: 160 10*3/uL (ref 150–400)
RBC: 4.53 MIL/uL (ref 4.22–5.81)
RDW: 13 % (ref 11.5–15.5)
WBC: 5 10*3/uL (ref 4.0–10.5)

## 2015-12-20 LAB — URINALYSIS, ROUTINE W REFLEX MICROSCOPIC
Bilirubin Urine: NEGATIVE
Glucose, UA: >1000 mg/dL — AB
Hgb urine dipstick: NEGATIVE
Ketones, ur: NEGATIVE mg/dL
LEUKOCYTES UA: NEGATIVE
NITRITE: NEGATIVE
PH: 5 (ref 5.0–8.0)
Protein, ur: NEGATIVE mg/dL
SPECIFIC GRAVITY, URINE: 1.036 — AB (ref 1.005–1.030)

## 2015-12-20 LAB — BASIC METABOLIC PANEL
ANION GAP: 11 (ref 5–15)
BUN: 19 mg/dL (ref 6–20)
CO2: 18 mmol/L — ABNORMAL LOW (ref 22–32)
Calcium: 10.1 mg/dL (ref 8.9–10.3)
Chloride: 98 mmol/L — ABNORMAL LOW (ref 101–111)
Creatinine, Ser: 1.21 mg/dL (ref 0.61–1.24)
GFR calc Af Amer: >60 mL/min (ref >60)
GLUCOSE: 582 mg/dL — AB (ref 65–99)
POTASSIUM: 4.9 mmol/L (ref 3.5–5.1)
SODIUM: 127 mmol/L — AB (ref 135–145)

## 2015-12-20 LAB — URINE MICROSCOPIC-ADD ON

## 2015-12-20 LAB — CBG MONITORING, ED: Glucose-Capillary: 529 mg/dL (ref 65–99)

## 2015-12-20 NOTE — ED Triage Notes (Signed)
Pt states he was discharged from hospital on Wednesday.  Reports dizziness, increased urination, and increased thirst.  States CBG was 585 at home.

## 2015-12-20 NOTE — Telephone Encounter (Signed)
Telephone Encounter Encounter Date: 12/20/2015 7:07 PM Burgess Estelle, MD  Internal Medicine     [] Hide copied text   Reason for call:   I received a call from [name] at 7:00  PM , and his sister was on the phone saying that pt's CBG is 580, and he is feeling lethargic, thirsty, and sluggish. Pt went to pharmacy and repeat BG was same.   He has not missed his insulin doses since yesterday and took mealtime TID, and lantus 40 units nighty   Pertinent Data:   As above.   Assessment / Plan / Recommendations:   Advised pt to come to the ER immediately since his sluggishness and lethargic is new and I am concerned he may be going again in DKA    As always, pt is advised that if symptoms worsen or new symptoms arise, they should go to an urgent care facility or to to ER for further evaluation.   Burgess Estelle, MD   12/20/2015, 7:04 PM

## 2015-12-20 NOTE — Progress Notes (Unsigned)
   Reason for call:   I received a call from [name] [name] at 7:00  PM , and his sister was on the phone saying that pt's CBG is 580, and he is feeling lethargic, thirsty, and sluggish. Pt went to pharmacy and repeat BG was same.   He has not missed his insulin doses since yesterday and took mealtime TID, and lantus 40 units nighty   Pertinent Data:   As above.   Assessment / Plan / Recommendations:   Advised pt to come to the ER immediately since his sluggishness and lethargic is new and I am concerned he may be going again in DKA    As always, pt is advised that if symptoms worsen or new symptoms arise, they should go to an urgent care facility or to to ER for further evaluation.   Burgess Estelle, MD   12/20/2015, 7:04 PM

## 2015-12-21 ENCOUNTER — Emergency Department (HOSPITAL_COMMUNITY)
Admission: EM | Admit: 2015-12-21 | Discharge: 2015-12-21 | Disposition: A | Discharge status: Home / Self Care | Payer: Medicare Other | Attending: Emergency Medicine | Admitting: Emergency Medicine

## 2015-12-21 DIAGNOSIS — E1165 Type 2 diabetes mellitus with hyperglycemia: Secondary | ICD-10-CM | POA: Diagnosis not present

## 2015-12-21 DIAGNOSIS — R739 Hyperglycemia, unspecified: Secondary | ICD-10-CM

## 2015-12-21 LAB — CBG MONITORING, ED
GLUCOSE-CAPILLARY: 335 mg/dL — AB (ref 65–99)
GLUCOSE-CAPILLARY: 389 mg/dL — AB (ref 65–99)
GLUCOSE-CAPILLARY: 500 mg/dL — AB (ref 65–99)
Glucose-Capillary: 410 mg/dL — ABNORMAL HIGH (ref 65–99)

## 2015-12-21 MED ORDER — SODIUM CHLORIDE 0.9 % IV BOLUS (SEPSIS)
1000.0000 mL | Freq: Once | INTRAVENOUS | Status: AC
  Administered 2015-12-21: 1000 mL via INTRAVENOUS

## 2015-12-21 MED ORDER — INSULIN ASPART 100 UNIT/ML ~~LOC~~ SOLN
15.0000 [IU] | Freq: Once | SUBCUTANEOUS | Status: AC
  Administered 2015-12-21: 15 [IU] via SUBCUTANEOUS
  Filled 2015-12-21: qty 1

## 2015-12-21 MED ORDER — INSULIN ASPART 100 UNIT/ML ~~LOC~~ SOLN
10.0000 [IU] | Freq: Once | SUBCUTANEOUS | Status: AC
  Administered 2015-12-21: 10 [IU] via SUBCUTANEOUS
  Filled 2015-12-21: qty 1

## 2015-12-21 MED ORDER — INSULIN GLARGINE 100 UNIT/ML ~~LOC~~ SOLN
20.0000 [IU] | Freq: Once | SUBCUTANEOUS | Status: AC
  Administered 2015-12-21: 20 [IU] via SUBCUTANEOUS
  Filled 2015-12-21: qty 0.2

## 2015-12-21 NOTE — ED Notes (Signed)
PAGED OUT LEVEL 2 TRAUMA

## 2015-12-21 NOTE — ED Provider Notes (Signed)
[facility] DEPT Provider Note   CSN: [id number] Arrival date & time: 12/20/15  2355     History   Chief Complaint Chief Complaint  Patient presents with  . Hyperglycemia    HPI TRYTON BODI is a 60 y.o. male.  Patient with history of insulin-dependent diabetes, congestive heart failure with EF = 30%, atrial fibrillation, chronic kidney disease, polysubstance abuse -- recent admission until yesterday with hyperglycemia and DKA -- presents with complaint of elevated blood sugar. Patient has been taking 10 units of insulin with meals and 40 at bedtime. He started this during his recent hospitalization. Prior to that he was off diabetes medications for several months. Patient states that he continues to feel very thirsty with excessive urination. No documented fevers, URI symptoms, chest pain, shortness of breath, vomiting, abdominal pain, diarrhea. No dysuria or hematuria. No worsening lower extremity swelling. The onset of this condition was acute. The course is constant. Aggravating factors: none. Alleviating factors: none.        Past Medical History:  Diagnosis Date  . Atrial fibrillation ([place])   . Cancer [facility])    Prostate cancer-bx. 3 weeks ago  . Cataract   . Chronic combined systolic and diastolic CHF (congestive heart failure) (HCC)    a) EF 40-45% per 2D echo (02/2012) with grade 1 DD b)  NICM c) RHC (04/2012): RA: 4, RV 45/3/4, PA 42/9 (24), PCWP 14, Fick CO/CI: 5.2 /2.2, PVR 1.9 WU, PA 62% and 64% d) ECHO (10/2012) EF 40-45%, grade II DD, RV nl  . CKD (chronic kidney disease) stage 2, GFR 60-89 ml/min    BL SCr approximately 1-1.3  . Colitis 05/2009   History of colitis of ascending colon noted on CT abd/pelvis (05/2009), with interval resolution with subsequent CT  . Continuous chronic alcoholism ([place])   . Degenerative lumbar spinal stenosis    s/p L2-3, L3-4, L4-5 laminectomy partial facetectomy, and bilateral foraminotomy  . Diabetes mellitus without  complication ([place])    Type II  . Dysrhythmia    A. Fib  . Family history of adverse reaction to anesthesia    "sister, can't go to sleep"  . GERD (gastroesophageal reflux disease)   . H/O cocaine abuse   . Hepatic steatosis    suspected 2/2 alcohol abuse  . Hepatitis C   . History of pancreatitis 01/2011   Admission for acute pancreatitis presumed 2/2 ongoing alcohol abuse- and hypertriglyceridemia  . History of pneumonia   . HIT (heparin-induced thrombocytopenia) ([place])   . Hyperlipidemia   . Hypertension    uncontrolled with medication noncompliance  . Hypertriglyceridemia   . NICM (nonischemic cardiomyopathy) ([place])    a. LHC (04/2012): nl arteries  . PFO (patent foramen ovale) 01/2012   with right to left shunt, noted per TEE in evaluation for source of embolic stroke in 07/3218  . Rhabdomyolysis 02/22/2012   H/O rhabdomyolysis in 01/2012 that was idiopathic, cause never identified  . Schizophrenia, schizo-affective ([place])   . Shortness of breath dyspnea   . Splenic cyst   . Stroke ([place]) 01/2012   Small cerebellar infarcts right greater than left as well as questionable acute left external capsule and caudate nuclear punctate lacunar infarcts noted per MRI (01/2012) - presumed to be embolic likely source PFO with right to left shunt (noted per TEE 01/ 2014)    Patient Active Problem List   Diagnosis Date Noted  . DKA (diabetic ketoacidoses) ([place]) 12/17/2015  . Diabetes mellitus without complication ([place])   .  At risk for obstructive sleep apnea 10/28/2015  . Overgrown toenails 09/10/2015  . Infestation by bed bug 06/12/2015  . Heart failure with reduced ejection fraction, NYHA class III (HCC) 06/07/2015  . Hepatitis C antibody test positive 06/07/2015  . Paroxysmal atrial fibrillation (HCC) 06/07/2015  . Healthcare maintenance 04/19/2014  . Schizophrenia (HCC) 03/06/2014  . Chronic back pain greater than 3 months duration 07/19/2012  . Gastric ulcer with hemorrhage 06/25/2012   . CKD (chronic kidney disease) stage 2, GFR 60-89 ml/min   . CVA (cerebral infarction) 02/29/2012  . HTN (hypertension) 02/20/2012    Past Surgical History:  Procedure Laterality Date  . ACHILLES TENDON REPAIR Right 2007   "it was torn"  . BACK SURGERY    . CARDIAC CATHETERIZATION N/A 09/02/2015   Procedure: Right Heart Cath;  Surgeon: Dolores Patty, MD;  [facility] INVASIVE CV LAB;  Service: Cardiovascular;  Laterality: N/A;  . CATARACT EXTRACTION W/ INTRAOCULAR LENS IMPLANT Right   . ESOPHAGOGASTRODUODENOSCOPY N/A 06/25/2012   Procedure: ESOPHAGOGASTRODUODENOSCOPY (EGD);  Surgeon: Rachael Fee, MD;  Location: [facility] ENDOSCOPY;  Service: Endoscopy;  Laterality: N/A;  . I&D EXTREMITY  03/20/2011   Procedure: IRRIGATION AND DEBRIDEMENT EXTREMITY;  Surgeon: Nestor Lewandowsky, MD;  Location: [facility] OR;  Service: Orthopedics;  Laterality: Left;  I&D LEFT ACHILLIES TENDON  . KNEE ARTHROSCOPY Left   . LUMBAR LAMINECTOMY     L2-3, L3-4, L4-5 laminectomy, partial facetectomy  . RESECTION DISTAL CLAVICAL  09/17/2011   Procedure: RESECTION DISTAL CLAVICAL;  Surgeon: Mable Paris, MD;  Location: [facility];  Service: Orthopedics;  Laterality: Right;  right shoulder arthroscopy with sad and open distal clavicle excision   . ROBOT ASSISTED LAPAROSCOPIC RADICAL PROSTATECTOMY N/A 12/16/2012   Procedure: ROBOTIC ASSISTED LAPAROSCOPIC PROSTATECTOMY ;  Surgeon: Crist Fat, MD;  Location: [facility] ORS;  Service: Urology;  Laterality: N/A;  . TONSILLECTOMY         Home Medications    Prior to Admission medications   Medication Sig Start Date End Date Taking? Authorizing Provider  acetaminophen (TYLENOL) 500 MG tablet Take 1,000 mg by mouth every 8 (eight) hours as needed for mild pain.    Historical Provider, MD  Blood Glucose Monitoring Suppl (ACCU-CHEK NANO SMARTVIEW) W/DEVICE KIT Use as instructed to test four times a day. DX: 250.92, insulin requiring Patient not taking:  Reported on 12/17/2015 03/07/14   Adonis Brook, NP  Blood Glucose Monitoring Suppl (ONE TOUCH ULTRA MINI) w/Device KIT 1 each by Does not apply route as needed. 12/19/15   Alm Bustard, MD  carvedilol (COREG) 6.25 MG tablet Take 6.25 mg by mouth 2 (two) times daily with a meal.    Historical Provider, MD  glucose blood (ONE TOUCH ULTRA TEST) test strip Use as instructed 12/19/15   Alm Bustard, MD  HYDROcodone-acetaminophen (NORCO) 10-325 MG tablet Take 1 tablet by mouth every 8 (eight) hours as needed.    Historical Provider, MD  Insulin Glargine (LANTUS) 100 UNIT/ML Solostar Pen Inject 40 Units into the skin daily at 10 pm. 12/20/15   Alm Bustard, MD  insulin lispro (HUMALOG) 100 UNIT/ML KiwkPen Inject 0.1 mLs (10 Units total) into the skin 3 (three) times daily before meals. 12/19/15   Alm Bustard, MD  Insulin Pen Needle (CAREFINE PEN NEEDLES) 32G X 5 MM MISC 1 each by Does not apply route as needed. 12/19/15   Alm Bustard, MD  [facility] DELICA LANCETS 33G MISC 1 each by Does not apply route daily  before breakfast. 12/19/15   Minus Liberty, MD  pantoprazole (PROTONIX) 40 MG tablet take 1 tablet by mouth once daily 08/06/15   Aldine Contes, MD  Potassium Chloride ER 20 MEQ TBCR Take 20 mEq by mouth daily. 09/04/15   Amy D Clegg, NP  QUEtiapine (SEROQUEL) 400 MG tablet Take 400 mg by mouth at bedtime.     Historical Provider, MD  rivaroxaban (XARELTO) 20 MG TABS tablet Take 1 tablet (20 mg total) by mouth daily. 06/07/15   Loleta Chance, MD  sacubitril-valsartan (ENTRESTO) 49-51 MG Take 1 tablet by mouth 2 (two) times daily. 09/25/15   Amy D Ninfa Meeker, NP  spironolactone (ALDACTONE) 25 MG tablet Take 1 tablet (25 mg total) by mouth daily. 05/30/15   Loleta Chance, MD  torsemide (DEMADEX) 20 MG tablet Take 3 tablets (60 mg total) by mouth daily. Patient taking differently: Take 80 mg by mouth daily.  09/04/15   Amy Estrella Deeds, NP    Family History Family History  Problem  Relation Age of Onset  . CAD Mother 68    deceased  . CAD Sister   . CAD Brother 64    died from MI at age 39yo  . Hypertension      Social History Social History  Substance Use Topics  . Smoking status: Former Smoker    Packs/day: 1.00    Years: 30.00    Types: Cigarettes    Quit date: 06/24/2001  . Smokeless tobacco: Never Used  . Alcohol use 0.0 oz/week     Comment: 12/17/2015 "stopped drinking 4-6 months ago"     Allergies   Heparin and Thorazine [chlorpromazine hcl]   Review of Systems Review of Systems  Constitutional: Negative for fever.  HENT: Negative for rhinorrhea and sore throat.   Eyes: Negative for redness.  Respiratory: Negative for cough.   Cardiovascular: Negative for chest pain.  Gastrointestinal: Negative for abdominal pain, diarrhea, nausea and vomiting.  Endocrine: Positive for polydipsia and polyuria.  Genitourinary: Positive for frequency. Negative for dysuria and hematuria.  Musculoskeletal: Negative for myalgias.  Skin: Negative for rash.  Neurological: Negative for headaches.     Physical Exam Updated Vital Signs BP 129/93   Pulse 80   Temp 98.2 F (36.8 C) (Oral)   Resp 18   Ht '5\' 11"'$  (1.803 m)   Wt 122.8 kg   SpO2 97%   BMI 37.77 kg/m   Physical Exam  Constitutional: He appears well-developed and well-nourished.  HENT:  Head: Normocephalic and atraumatic.  Mouth/Throat: Mucous membranes are not pale and dry.  Eyes: Conjunctivae are normal. Right eye exhibits no discharge. Left eye exhibits no discharge.  Neck: Normal range of motion. Neck supple.  Cardiovascular: Normal rate, regular rhythm and normal heart sounds.   No murmur heard. Pulmonary/Chest: Effort normal and breath sounds normal. No respiratory distress. He has no wheezes. He has no rales.  Abdominal: Soft. He exhibits no mass. There is no tenderness. There is no guarding.  Neurological: He is alert.  Skin: Skin is warm and dry.  Dry, flaky skin  Psychiatric:  He has a normal mood and affect.  Nursing note and vitals reviewed.    ED Treatments / Results  Labs (all labs ordered are listed, but only abnormal results are displayed) Labs Reviewed  BASIC METABOLIC PANEL - Abnormal; Notable for the following:       Result Value   Sodium 127 (*)    Chloride 98 (*)    CO2 18 (*)  Glucose, Bld 582 (*)    All other components within normal limits  URINALYSIS, ROUTINE W REFLEX MICROSCOPIC (NOT AT [facility]) - Abnormal; Notable for the following:    Specific Gravity, Urine 1.036 (*)    Glucose, UA >1000 (*)    All other components within normal limits  URINE MICROSCOPIC-ADD ON - Abnormal; Notable for the following:    Squamous Epithelial / LPF 0-5 (*)    Bacteria, UA RARE (*)    All other components within normal limits  CBG MONITORING, ED - Abnormal; Notable for the following:    Glucose-Capillary 529 (*)    All other components within normal limits  CBC    Procedures Procedures (including critical care time)  Medications Ordered in ED Medications  sodium chloride 0.9 % bolus 1,000 mL (not administered)  insulin aspart (novoLOG) injection 10 Units (not administered)     Initial Impression / Assessment and Plan / ED Course  I have reviewed the triage vital signs and the nursing notes.  Pertinent labs & imaging results that were available during my care of the patient were reviewed by me and considered in my medical decision making (see chart for details).  Clinical Course    Patient seen and examined. Work-up initiated. Medications ordered. States compliant with medications since returning home. He is here today because CBG was high and was told by PCP to come in, not so much for symptoms. Will give liter bolus being mindful of CHF history, he does appear dry. Will also treat with SQ insulin. Will recheck CBG at that time. No clinical or lab signs of DKA today thankfully.   Vital signs reviewed and are as follows: BP 129/93   Pulse 80    Temp 98.2 F (36.8 C) (Oral)   Resp 18   Ht '5\' 11"'$  (1.803 m)   Wt 122.8 kg   SpO2 97%   BMI 37.77 kg/m   12:54 AM Handoff to Dr. Betsey Holiday at shift change.   Final Clinical Impressions(s) / ED Diagnoses   Final diagnoses:  Hyperglycemia   Pending completion of treatment.   New Prescriptions New Prescriptions   No medications on file     Carlisle Cater, PA-C 12/21/15 [id number]    Orpah Greek, MD 12/21/15 [phone]

## 2015-12-21 NOTE — Progress Notes (Signed)
   12/21/15 0400  Clinical Encounter Type  Visited With Patient not available  Visit Type Initial;Trauma  Referral From Nurse   Chaplain responded to level 2 trauma, no family available at this time.

## 2015-12-23 ENCOUNTER — Telehealth: Payer: Self-pay

## 2015-12-23 ENCOUNTER — Ambulatory Visit (INDEPENDENT_AMBULATORY_CARE_PROVIDER_SITE_OTHER): Payer: Medicare Other | Admitting: Internal Medicine

## 2015-12-23 ENCOUNTER — Encounter: Payer: Self-pay | Admitting: Internal Medicine

## 2015-12-23 ENCOUNTER — Telehealth: Payer: Self-pay | Admitting: Internal Medicine

## 2015-12-23 VITALS — BP 158/78 | HR 88 | Temp 98.5°F | Ht 71.0 in | Wt 276.6 lb

## 2015-12-23 DIAGNOSIS — E1165 Type 2 diabetes mellitus with hyperglycemia: Secondary | ICD-10-CM

## 2015-12-23 DIAGNOSIS — Z87891 Personal history of nicotine dependence: Secondary | ICD-10-CM

## 2015-12-23 DIAGNOSIS — Z794 Long term (current) use of insulin: Secondary | ICD-10-CM

## 2015-12-23 DIAGNOSIS — E1136 Type 2 diabetes mellitus with diabetic cataract: Secondary | ICD-10-CM

## 2015-12-23 DIAGNOSIS — I5020 Unspecified systolic (congestive) heart failure: Secondary | ICD-10-CM

## 2015-12-23 DIAGNOSIS — I5022 Chronic systolic (congestive) heart failure: Secondary | ICD-10-CM

## 2015-12-23 DIAGNOSIS — Z0000 Encounter for general adult medical examination without abnormal findings: Secondary | ICD-10-CM

## 2015-12-23 LAB — URINALYSIS, ROUTINE W REFLEX MICROSCOPIC
BILIRUBIN URINE: NEGATIVE
Hgb urine dipstick: NEGATIVE
KETONES UR: NEGATIVE mg/dL
Leukocytes, UA: NEGATIVE
Nitrite: NEGATIVE
PH: 5 (ref 5.0–8.0)
Protein, ur: NEGATIVE mg/dL
SPECIFIC GRAVITY, URINE: 1.038 — AB (ref 1.005–1.030)

## 2015-12-23 LAB — BASIC METABOLIC PANEL
Anion gap: 9 (ref 5–15)
BUN: 16 mg/dL (ref 6–20)
CALCIUM: 10.1 mg/dL (ref 8.9–10.3)
CHLORIDE: 104 mmol/L (ref 101–111)
CO2: 20 mmol/L — AB (ref 22–32)
CREATININE: 1.05 mg/dL (ref 0.61–1.24)
GFR calc Af Amer: >60 mL/min (ref >60)
GFR calc non Af Amer: >60 mL/min (ref >60)
GLUCOSE: 455 mg/dL — AB (ref 65–99)
Potassium: 4.9 mmol/L (ref 3.5–5.1)
Sodium: 133 mmol/L — ABNORMAL LOW (ref 135–145)

## 2015-12-23 LAB — URINE MICROSCOPIC-ADD ON
Bacteria, UA: NONE SEEN
RBC / HPF: NONE SEEN RBC/hpf (ref 0–5)

## 2015-12-23 LAB — GLUCOSE, CAPILLARY
GLUCOSE-CAPILLARY: 411 mg/dL — AB (ref 65–99)
Glucose-Capillary: 499 mg/dL — ABNORMAL HIGH (ref 65–99)

## 2015-12-23 MED ORDER — INSULIN ASPART 100 UNIT/ML ~~LOC~~ SOLN
10.0000 [IU] | Freq: Once | SUBCUTANEOUS | Status: AC
  Administered 2015-12-23: 10 [IU] via SUBCUTANEOUS

## 2015-12-23 MED ORDER — INSULIN ASPART 100 UNIT/ML ~~LOC~~ SOLN
5.0000 [IU] | Freq: Once | SUBCUTANEOUS | Status: AC
  Administered 2015-12-23: 5 [IU] via SUBCUTANEOUS

## 2015-12-23 MED ORDER — METFORMIN HCL 500 MG PO TABS: 500.0000 mg | ORAL_TABLET | Freq: Two times a day (BID) | ORAL | 11 refills | Status: DC

## 2015-12-23 MED ORDER — VARICELLA VIRUS VACCINE LIVE 1350 PFU/0.5ML IJ SUSR: 0.5000 mL | Freq: Once | INTRAMUSCULAR | 0 refills | Status: AC

## 2015-12-23 MED ORDER — SODIUM CHLORIDE 0.9 % IV BOLUS (SEPSIS)
1000.0000 mL | Freq: Once | INTRAVENOUS | Status: AC
  Administered 2015-12-23: 1000 mL via INTRAVENOUS

## 2015-12-23 NOTE — Assessment & Plan Note (Signed)
States he received influenza and pneumonia vaccine at the drug store in July of this year. Discussed with him that the flu vaccine was not available at that time but he insist that he has had it.   Referral for retinal scan  Ordered urine microalbumin/ creatinine ratio  Provided prescription for zostavax

## 2015-12-23 NOTE — Progress Notes (Signed)
CC: hyperglycemia   HPI: [name] is a 60 y.o. with past medical history hypertension, systolic CHF, atrial fibrillation, and CKD who presents to clinic for follow up after recent hospitalization for hyperosmolar hyperglycemic state. He was admitted 11/21-11/24 for Hyperosmolar hyperglycemic state after he had stopped taking all of his diabetes medications. 01/2015 his diabetes was well controlled, he was instructed to discontinue mealtime insulin but continue basal insulin. At that time he stopped taking all insulin completely. He presented to [place] 11/24 with malaise, blurry vision, polyuria, polydipsia and was found to have blood glucose 3710 and metabolic acidosis. He was discharged on Lantus 40 units q HS and Novolog 10 units at meals. He forgot to take the lantus the evening of discharge and presented to the ED the next morning with glucose 626 and no metabolic acidosis. Glucose improved to 300 with IV hydration and subcutaneous insulin. He has been recording his blood sugar at home since presenting to the ED, meter readings have ranged 300-500s on checks throughout the day. Today his glucose is 499. He ate a bologna sandwich with white bread for lunch. He endorses polydipsia, blurred vision, nausea, and shortness of breath. He denies polyuria, abdominal pain, and vomiting.   Please see problem list for status of the pt's chronic medical problems.  Past Medical History:  Diagnosis Date  . Atrial fibrillation ([place])   . Cancer [facility])    Prostate cancer-bx. 3 weeks ago  . Cataract   . Chronic combined systolic and diastolic CHF (congestive heart failure) (HCC)    a) EF 40-45% per 2D echo (02/2012) with grade 1 DD b)  NICM c) RHC (04/2012): RA: 4, RV 45/3/4, PA 42/9 (24), PCWP 14, Fick CO/CI: 5.2 /2.2, PVR 1.9 WU, PA 62% and 64% d) ECHO (10/2012) EF 40-45%, grade II DD, RV nl  . CKD (chronic kidney disease) stage 2, GFR 60-89 ml/min    BL SCr approximately 1-1.3  . Colitis 05/2009     History of colitis of ascending colon noted on CT abd/pelvis (05/2009), with interval resolution with subsequent CT  . Continuous chronic alcoholism ([place])   . Degenerative lumbar spinal stenosis    s/p L2-3, L3-4, L4-5 laminectomy partial facetectomy, and bilateral foraminotomy  . Diabetes mellitus without complication ([place])    Type II  . Dysrhythmia    A. Fib  . Family history of adverse reaction to anesthesia    "sister, can't go to sleep"  . GERD (gastroesophageal reflux disease)   . H/O cocaine abuse   . Hepatic steatosis    suspected 2/2 alcohol abuse  . Hepatitis C   . History of pancreatitis 01/2011   Admission for acute pancreatitis presumed 2/2 ongoing alcohol abuse- and hypertriglyceridemia  . History of pneumonia   . HIT (heparin-induced thrombocytopenia) ([place])   . Hyperlipidemia   . Hypertension    uncontrolled with medication noncompliance  . Hypertriglyceridemia   . NICM (nonischemic cardiomyopathy) ([place])    a. LHC (04/2012): nl arteries  . PFO (patent foramen ovale) 01/2012   with right to left shunt, noted per TEE in evaluation for source of embolic stroke in 09/4852  . Rhabdomyolysis 02/22/2012   H/O rhabdomyolysis in 01/2012 that was idiopathic, cause never identified  . Schizophrenia, schizo-affective ([place])   . Shortness of breath dyspnea   . Splenic cyst   . Stroke ([place]) 01/2012   Small cerebellar infarcts right greater than left as well as questionable acute left external capsule and caudate nuclear punctate  lacunar infarcts noted per MRI (01/2012) - presumed to be embolic likely source PFO with right to left shunt (noted per TEE 01/ 2014)   Review of Systems:  Please see each problem below for a pertinent review of systems. ROS  Physical Exam:  Vitals:   12/23/15 1414  BP: (!) 158/78  Pulse: 88  Temp: 98.5 F (36.9 C)  TempSrc: Oral  SpO2: 100%  Weight: 276 lb 9.6 oz (125.5 kg)  Height: 5\' 11"  (1.803 m)   Physical Exam  Constitutional: He  appears well-developed and well-nourished. No distress.  Cardiovascular: Normal rate and regular rhythm.   No murmur heard. Pulmonary/Chest: Effort normal. No respiratory distress. He has no wheezes. He has no rales.  Abdominal: Soft. He exhibits distension. There is no tenderness. There is no guarding.  Musculoskeletal: He exhibits no edema.  Neurological: He is alert.  Skin: Skin is warm and dry.   Assessment & Plan:   See Encounters Tab for problem based charting.  Patient seen with Dr. Angelia Mould

## 2015-12-23 NOTE — Assessment & Plan Note (Signed)
Denies shortness of breath, orthopnea, PND, leg swelling. Euvolemic on exam today. His medications are controlling his CHF symptoms.   Continue carvedilol 6.25 mg BID  Continue torsemide 25 mg daily  Continue spironolactone 60 mg daily

## 2015-12-23 NOTE — Assessment & Plan Note (Addendum)
He was admitted 11/21-11/24 for Hyperosmolar hyperglycemic state after he had stopped taking all of his diabetes medications. 01/2015 his diabetes was well controlled, he was instructed to discontinue mealtime insulin but continue basal insulin. At that time he stopped taking all insulin completely. He presented to [place] 11/24 with malaise, blurry vision, polyuria, polydipsia and was found to have blood glucose 7225 and metabolic acidosis. He was discharged on Lantus 40 units q HS and Novolog 10 units at meals. He forgot to take the lantus the evening of discharge and presented to the ED the next morning with glucose 750 and no metabolic acidosis. Glucose improved to 300 with IV hydration and subcutaneous insulin. He has been recording his blood sugar at home since presenting to the ED, meter readings have ranged 300-500s on checks throughout the day. Today he has a blood sugar 499. He ate a bologna sandwich with white bread for lunch. He endorses polydipsia, blurred vision, nausea, and shortness of breath. He denies polyuria, abdominal pain, and vomiting.   Ordered stat UA, BMET in the office. Urinalysis had significant glucose without ketones. BMET showed no anion gap or acidosis. Treated with 1 L fluids and Novolog 10 units and blood sugar improved to 411 about one hour after initial treatment. Treated with additional 5 units Novolog.   Increase lantus to 50 U qHS (was previously 40 U)  Increassed novolog to 15 U at mealtime  (was previously 10 U)  Started metformin 500 mg BID Instructed him to keep monitoring his blood glucose and call us if it remains elevated or if he has any hypoglycemia. He will follow up in clinic in 1 week.  Referral to diabetes educator  Referral for retinal scan (last was in 2015, showed no diabetic retinopathy)

## 2015-12-23 NOTE — Telephone Encounter (Signed)
APT. REMINDER CALL, LMTCB °

## 2015-12-23 NOTE — Patient Instructions (Addendum)
It was a pleasure to meet you Mr. Hislop   For your diabetes -  INCREASE your lantus to 50 units at bedtime ( you were previously taking 40 units)  INCREASE your Novolog to 15 units with meals  START taking Metformin 500 mg twice daily   Keep checking your blood sugar regularly, you are doing a great job with monitoring Call us if your sugar remains high or if you have any episodes of low sugar   I have sent a request for you to meet with our diabetes coordinator here in the clinic, please call our clinic if you have not been contacted to schedule this appointment in the next few weeks  Call to schedule a follow up appointment in 1 week  Please continue taking all of your other medications listed as prescribed. Have your pharmacy call our office if you need refills on any of these medications.     Diabetes Mellitus and Food It is important for you to manage your blood sugar (glucose) level. Your blood glucose level can be greatly affected by what you eat. Eating healthier foods in the appropriate amounts throughout the day at about the same time each day will help you control your blood glucose level. It can also help slow or prevent worsening of your diabetes mellitus. Healthy eating may even help you improve the level of your blood pressure and reach or maintain a healthy weight. General recommendations for healthful eating and cooking habits include:  Eating meals and snacks regularly. Avoid going long periods of time without eating to lose weight.  Eating a diet that consists mainly of plant-based foods, such as fruits, vegetables, nuts, legumes, and whole grains.  Using low-heat cooking methods, such as baking, instead of high-heat cooking methods, such as deep frying. Work with your dietitian to make sure you understand how to use the Nutrition Facts information on food labels. How can food affect me? Carbohydrates  Carbohydrates affect your blood glucose level more than any  other type of food. Your dietitian will help you determine how many carbohydrates to eat at each meal and teach you how to count carbohydrates. Counting carbohydrates is important to keep your blood glucose at a healthy level, especially if you are using insulin or taking certain medicines for diabetes mellitus. Alcohol  Alcohol can cause sudden decreases in blood glucose (hypoglycemia), especially if you use insulin or take certain medicines for diabetes mellitus. Hypoglycemia can be a life-threatening condition. Symptoms of hypoglycemia (sleepiness, dizziness, and disorientation) are similar to symptoms of having too much alcohol. If your health care provider has given you approval to drink alcohol, do so in moderation and use the following guidelines:  Women should not have more than one drink per day, and men should not have more than two drinks per day. One drink is equal to:  12 oz of beer.  5 oz of wine.  1 oz of hard liquor.  Do not drink on an empty stomach.  Keep yourself hydrated. Have water, diet soda, or unsweetened iced tea.  Regular soda, juice, and other mixers might contain a lot of carbohydrates and should be counted. What foods are not recommended? As you make food choices, it is important to remember that all foods are not the same. Some foods have fewer nutrients per serving than other foods, even though they might have the same number of calories or carbohydrates. It is difficult to get your body what it needs when you eat foods with fewer  nutrients. Examples of foods that you should avoid that are high in calories and carbohydrates but low in nutrients include:  Trans fats (most processed foods list trans fats on the Nutrition Facts label).  Regular soda.  Juice.  Candy.  Sweets, such as cake, pie, doughnuts, and cookies.  Fried foods. What foods can I eat? Eat nutrient-rich foods, which will nourish your body and keep you healthy. The food you should eat also  will depend on several factors, including:  The calories you need.  The medicines you take.  Your weight.  Your blood glucose level.  Your blood pressure level.  Your cholesterol level. You should eat a variety of foods, including:  Protein.  Lean cuts of meat.  Proteins low in saturated fats, such as fish, egg whites, and beans. Avoid processed meats.  Fruits and vegetables.  Fruits and vegetables that may help control blood glucose levels, such as apples, mangoes, and yams.  Dairy products.  Choose fat-free or low-fat dairy products, such as milk, yogurt, and cheese.  Grains, bread, pasta, and rice.  Choose whole grain products, such as multigrain bread, whole oats, and brown rice. These foods may help control blood pressure.  Fats.  Foods containing healthful fats, such as nuts, avocado, olive oil, canola oil, and fish. Does everyone with diabetes mellitus have the same meal plan? Because every person with diabetes mellitus is different, there is not one meal plan that works for everyone. It is very important that you meet with a dietitian who will help you create a meal plan that is just right for you. This information is not intended to replace advice given to you by your health care provider. Make sure you discuss any questions you have with your health care provider. Document Released: 10/09/2004 Document Revised: 06/20/2015 Document Reviewed: 12/09/2012 Elsevier Interactive Patient Education  2017 [employer].

## 2015-12-23 NOTE — Telephone Encounter (Signed)
Patient called reports he was in hospital due to blood sugar 1000 was released on Thanksgiving reports he is still concerned because  his blood sugar is still high 413 this am. Appointment scheduled for [facility] today at 1:45pm reminded patient to bring meter with him

## 2015-12-24 ENCOUNTER — Emergency Department (HOSPITAL_COMMUNITY)
Admission: EM | Admit: 2015-12-24 | Discharge: 2015-12-24 | Disposition: A | Discharge status: Home / Self Care | Payer: Medicare Other | Attending: Emergency Medicine | Admitting: Emergency Medicine

## 2015-12-24 ENCOUNTER — Encounter (HOSPITAL_COMMUNITY): Payer: Self-pay | Admitting: *Deleted

## 2015-12-24 ENCOUNTER — Telehealth: Payer: Self-pay | Admitting: *Deleted

## 2015-12-24 ENCOUNTER — Ambulatory Visit: Payer: Self-pay | Admitting: Internal Medicine

## 2015-12-24 DIAGNOSIS — Z5321 Procedure and treatment not carried out due to patient leaving prior to being seen by health care provider: Secondary | ICD-10-CM | POA: Diagnosis not present

## 2015-12-24 DIAGNOSIS — I130 Hypertensive heart and chronic kidney disease with heart failure and stage 1 through stage 4 chronic kidney disease, or unspecified chronic kidney disease: Secondary | ICD-10-CM | POA: Insufficient documentation

## 2015-12-24 DIAGNOSIS — Z794 Long term (current) use of insulin: Secondary | ICD-10-CM | POA: Insufficient documentation

## 2015-12-24 DIAGNOSIS — Z87891 Personal history of nicotine dependence: Secondary | ICD-10-CM | POA: Insufficient documentation

## 2015-12-24 DIAGNOSIS — E1165 Type 2 diabetes mellitus with hyperglycemia: Secondary | ICD-10-CM | POA: Diagnosis not present

## 2015-12-24 DIAGNOSIS — Z8673 Personal history of transient ischemic attack (TIA), and cerebral infarction without residual deficits: Secondary | ICD-10-CM | POA: Insufficient documentation

## 2015-12-24 DIAGNOSIS — N182 Chronic kidney disease, stage 2 (mild): Secondary | ICD-10-CM | POA: Insufficient documentation

## 2015-12-24 DIAGNOSIS — I5042 Chronic combined systolic (congestive) and diastolic (congestive) heart failure: Secondary | ICD-10-CM | POA: Insufficient documentation

## 2015-12-24 DIAGNOSIS — Z7901 Long term (current) use of anticoagulants: Secondary | ICD-10-CM | POA: Diagnosis not present

## 2015-12-24 LAB — URINALYSIS, ROUTINE W REFLEX MICROSCOPIC
Bilirubin Urine: NEGATIVE
Glucose, UA: >1000 mg/dL — AB
Hgb urine dipstick: NEGATIVE
Ketones, ur: NEGATIVE mg/dL
Leukocytes, UA: NEGATIVE
Nitrite: NEGATIVE
Protein, ur: NEGATIVE mg/dL
Specific Gravity, Urine: 1.034 — ABNORMAL HIGH (ref 1.005–1.030)
pH: 5.5 (ref 5.0–8.0)

## 2015-12-24 LAB — CBC
HCT: 37.9 % — ABNORMAL LOW (ref 39.0–52.0)
Hemoglobin: 13 g/dL (ref 13.0–17.0)
MCH: 31.2 pg (ref 26.0–34.0)
MCHC: 34.3 g/dL (ref 30.0–36.0)
MCV: 90.9 fL (ref 78.0–100.0)
Platelets: 165 10*3/uL (ref 150–400)
RBC: 4.17 MIL/uL — ABNORMAL LOW (ref 4.22–5.81)
RDW: 13.2 % (ref 11.5–15.5)
WBC: 5.2 10*3/uL (ref 4.0–10.5)

## 2015-12-24 LAB — BASIC METABOLIC PANEL
Anion gap: 7 (ref 5–15)
BUN: 12 mg/dL (ref 6–20)
CO2: 22 mmol/L (ref 22–32)
Calcium: 9.9 mg/dL (ref 8.9–10.3)
Chloride: 106 mmol/L (ref 101–111)
Creatinine, Ser: 0.94 mg/dL (ref 0.61–1.24)
GFR calc Af Amer: >60 mL/min (ref >60)
GFR calc non Af Amer: >60 mL/min (ref >60)
Glucose, Bld: 293 mg/dL — ABNORMAL HIGH (ref 65–99)
Potassium: 4.6 mmol/L (ref 3.5–5.1)
Sodium: 135 mmol/L (ref 135–145)

## 2015-12-24 LAB — URINE MICROSCOPIC-ADD ON: RBC / HPF: NONE SEEN RBC/hpf (ref 0–5)

## 2015-12-24 LAB — CBG MONITORING, ED: GLUCOSE-CAPILLARY: 309 mg/dL — AB (ref 65–99)

## 2015-12-24 LAB — MICROALBUMIN / CREATININE URINE RATIO
CREATININE, UR: 54.3 mg/dL
MICROALB UR: 9.5 ug/mL — AB
Microalb Creat Ratio: 17.5 mg/g creat (ref 0.0–30.0)

## 2015-12-24 NOTE — Telephone Encounter (Signed)
Pt calls and states his meter is not registering a # when he checks his cbg, states he is short of breath, he denies weakness, chest pain. He is advised to go to ED or call 911 now

## 2015-12-24 NOTE — Progress Notes (Signed)
Internal Medicine Clinic Attending  I saw and evaluated the patient.  I personally confirmed the key portions of the history and exam documented by Dr. Hetty Ely and I reviewed pertinent patient test results.  The assessment, diagnosis, and plan were formulated together and I agree with the documentation in the resident's note. He has no evidence of DKA, review of his glucometer showed blood sugars in the 400-500 range consistently despite his reported usage of lantus 40units and novolog 10units TIDWC.  Here in the office we treated with 1L of IVF while awaiting stat labs and 10 units of Lantus.  Given his repeat CBG after about 1 hour had only dropped by 90, we gave him an additional 5 units of Novolog.  We will make large changes to his regimen given his apparent insulin resistance.  We will increase his Lantus to 50 units and Novolog to 15units TIDAC.  We will also have him resume his metformin and have close follow up.

## 2015-12-24 NOTE — ED Triage Notes (Signed)
Pt reports having cbg >600 today. Having dizziness and increased urination. Reports taking all meds as prescribed. cbg 309 at triage.

## 2015-12-24 NOTE — Telephone Encounter (Signed)
I agree as well

## 2015-12-24 NOTE — Telephone Encounter (Signed)
Dr Heber [place] gave verbal agreement

## 2015-12-26 ENCOUNTER — Ambulatory Visit (HOSPITAL_COMMUNITY)
Admission: RE | Admit: 2015-12-26 | Discharge: 2015-12-26 | Disposition: A | Discharge status: Home / Self Care | Payer: Medicare Other | Source: Ambulatory Visit | Attending: Internal Medicine | Admitting: Internal Medicine

## 2015-12-26 ENCOUNTER — Ambulatory Visit (HOSPITAL_BASED_OUTPATIENT_CLINIC_OR_DEPARTMENT_OTHER)
Admission: RE | Admit: 2015-12-26 | Discharge: 2015-12-26 | Disposition: A | Discharge status: Home / Self Care | Payer: Medicare Other | Source: Ambulatory Visit | Attending: Internal Medicine | Admitting: Internal Medicine

## 2015-12-26 ENCOUNTER — Encounter (HOSPITAL_COMMUNITY): Payer: Self-pay | Admitting: Internal Medicine

## 2015-12-26 VITALS — BP 142/90 | HR 68 | Wt 276.0 lb

## 2015-12-26 DIAGNOSIS — I10 Essential (primary) hypertension: Secondary | ICD-10-CM

## 2015-12-26 DIAGNOSIS — I5023 Acute on chronic systolic (congestive) heart failure: Secondary | ICD-10-CM

## 2015-12-26 DIAGNOSIS — I5022 Chronic systolic (congestive) heart failure: Secondary | ICD-10-CM

## 2015-12-26 MED ORDER — SACUBITRIL-VALSARTAN 97-103 MG PO TABS: 1.0000 | ORAL_TABLET | Freq: Two times a day (BID) | ORAL | Status: DC

## 2015-12-26 MED ORDER — CARVEDILOL 6.25 MG PO TABS: 6.2500 mg | ORAL_TABLET | Freq: Two times a day (BID) | ORAL | 3 refills | Status: DC

## 2015-12-26 NOTE — Patient Instructions (Signed)
Increase Entresto to 97/103 (1 Tab) Two times Daily  Follow up in 3 Months

## 2015-12-26 NOTE — Progress Notes (Signed)
Patient ID: [name] [name], male   DOB: [birth year], 60 y.o.   MRN: [id number]    Advanced Heart Failure Clinic Note   Primary Care: Internal Medicine clinic, Dr. Silverio Decamp Primary Cardiologist: Dr. Doylene Canard  GI: Dr Ardis Hughs.  Primary HF: Dr. Haroldine Laws   HPI: [name] [name] is a 71 y.o. 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 Had history of acute renal failure in setting of rhabdo requiring short term HD.    Admitted with new onset HF in April 2014.   He was diuresed and discharged home.  Discharge weight 256 pounds. Echo at the time showed EF 52-84%, grade 1 diastolic dysfunction.    Admitted to [facility] 5/30 through 06/27/12 with GI bleed. EGD 06/25/12 multiple ulcers noted. Continued on protonix.   Admitted 8/2 through 09/03/15 with marked volume overload in the setting of medication compliance. Diuresed with IV lasix and transitioned to torsemide 60 mg daily. Hospital course complicated by elevated renal function.Clonidine was stopped on discharge and carvedilol was cut back to 6.25 mg twice a day. Discharge weight 272 pounds.    He was admitted 11/21-11/24 for Hyperosmolar hyperglycemic state after he had stopped taking all of his diabetes medications.   He presents today for regular follow up.  Says he is feeling pretty good. But sugars still running about 300. Last visit Entresto increased.  Weight at home up about 5-7 pounds at 275.  Pees well on torsemide.  Denies any lightheadedness or dizziness. Denies CP. Tries to watch salt and fluids.  Has been out of carvedilol for 2 weeks says he lost the bottle but says he is taking all other medication. Denies edema. + occasional orthopnea. Paramedicine no longer coming to his house.   Echo today reviewed personally EF 30-35% RV ok.    ECHO 10/27/12 EF 40-45%  ECHO 06/04/15 LVEF 20-25%, decreased diastolic compliance, LAE, RV severely reduced.  ECHO 11/26/15 EF 30%   RHC 08/2015 RA = 7 RV = 35/10 PA = 37/15 (25) PCW = 10 Fick cardiac output/index = 4.3/1.7 PVR = 3.5 WU Ao sat = 95% PA sat = 58%, 58% Assessment: 1. Volume status looks good. 2. Cardiac output depressed  2014 LHC -  Left main: Normal LAD: Large vessel gives of 2 diagonals. Normal LCX: Very large vessel with 3 OMs. 20-30% plaque in midsection.  RCA: Dominant. Normal LV-gram done in the RAO projection: Ejection fraction = 35-40% global HK Assessment: 1. Minimal CAD  Labs 02/12/13 K 4.3 Creatinine 0.98          08/2013: K 3.9, creatinine 1.27, Mag 1.8, pro-BNP 392, Troponin 0.05          06/07/15: K 4.2, creatinine 1.21          07/10/2015: K 3.9 Creatinine 1.16           09/03/2015: K 4.7 Creatinine 1.32          09/11/2015: K 4.1 Creatinine 1.29   SH: Quit smoking 9 years ago. Lives alone. Quit ETOH 09-29-13 FH: Mom died from MI in her early 60s  Brother MI and deceased. Does not know about fathers health history.    Review of Systems: All pertinent positives and negatives as in HPI, otherwise negative.    Past Medical History:  Diagnosis Date  . Atrial fibrillation ([place])   .  Cancer [facility])    Prostate cancer-bx. 3 weeks ago  . Cataract   . Chronic combined systolic and diastolic CHF (congestive heart failure) (HCC)    a) EF 40-45% per 2D echo (02/2012) with grade 1 DD b)  NICM c) RHC (04/2012): RA: 4, RV 45/3/4, PA 42/9 (24), PCWP 14, Fick CO/CI: 5.2 /2.2, PVR 1.9 WU, PA 62% and 64% d) ECHO (10/2012) EF 40-45%, grade II DD, RV nl  . CKD (chronic kidney disease) stage 2, GFR 60-89 ml/min    BL SCr approximately 1-1.3  . Colitis 05/2009   History of colitis of ascending colon noted on CT abd/pelvis (05/2009), with interval resolution with subsequent CT  . Continuous chronic alcoholism ([place])   . Degenerative lumbar spinal stenosis    s/p  L2-3, L3-4, L4-5 laminectomy partial facetectomy, and bilateral foraminotomy  . Diabetes mellitus without complication ([place])    Type II  . Dysrhythmia    A. Fib  . Family history of adverse reaction to anesthesia    "sister, can't go to sleep"  . GERD (gastroesophageal reflux disease)   . H/O cocaine abuse   . Hepatic steatosis    suspected 2/2 alcohol abuse  . Hepatitis C   . History of pancreatitis 01/2011   Admission for acute pancreatitis presumed 2/2 ongoing alcohol abuse- and hypertriglyceridemia  . History of pneumonia   . HIT (heparin-induced thrombocytopenia) ([place])   . Hyperlipidemia   . Hypertension    uncontrolled with medication noncompliance  . Hypertriglyceridemia   . NICM (nonischemic cardiomyopathy) ([place])    a. LHC (04/2012): nl arteries  . PFO (patent foramen ovale) 01/2012   with right to left shunt, noted per TEE in evaluation for source of embolic stroke in 0/1093  . Rhabdomyolysis 02/22/2012   H/O rhabdomyolysis in 01/2012 that was idiopathic, cause never identified  . Schizophrenia, schizo-affective ([place])   . Shortness of breath dyspnea   . Splenic cyst   . Stroke ([place]) 01/2012   Small cerebellar infarcts right greater than left as well as questionable acute left external capsule and caudate nuclear punctate lacunar infarcts noted per MRI (01/2012) - presumed to be embolic likely source PFO with right to left shunt (noted per TEE 01/ 2014)    Current Outpatient Prescriptions  Medication Sig Dispense Refill  . acetaminophen (TYLENOL) 500 MG tablet Take 1,000 mg by mouth every 8 (eight) hours as needed for mild pain.    Marland Kitchen aspirin EC 81 MG tablet Take 81 mg by mouth daily.    . Blood Glucose Monitoring Suppl (ACCU-CHEK NANO SMARTVIEW) W/DEVICE KIT Use as instructed to test four times a day. DX: 250.92, insulin requiring 1 kit 0  . Blood Glucose Monitoring Suppl (ONE TOUCH ULTRA MINI) w/Device KIT 1 each by Does not apply route as needed. 1 each 0  . glucose  blood (ONE TOUCH ULTRA TEST) test strip Use as instructed 100 each 12  . HYDROcodone-acetaminophen (NORCO) 10-325 MG tablet Take 1 tablet by mouth every 8 (eight) hours as needed.    . Insulin Glargine (LANTUS) 100 UNIT/ML Solostar Pen Inject 40 Units into the skin daily at 10 pm. 15 mL 11  . insulin lispro (HUMALOG) 100 UNIT/ML KiwkPen Inject 0.1 mLs (10 Units total) into the skin 3 (three) times daily before meals. 15 mL 11  . Insulin Pen Needle (CAREFINE PEN NEEDLES) 32G X 5 MM MISC 1 each by Does not apply route as needed. 100 each 11  . metFORMIN (GLUCOPHAGE) 500 MG  tablet Take 1 tablet (500 mg total) by mouth 2 (two) times daily with a meal. 60 tablet 11  . ONETOUCH DELICA LANCETS 00T MISC 1 each by Does not apply route daily before breakfast. 100 each 11  . Potassium Chloride ER 20 MEQ TBCR Take 20 mEq by mouth daily. 30 tablet 6  . rivaroxaban (XARELTO) 20 MG TABS tablet Take 1 tablet (20 mg total) by mouth daily. 90 tablet 3  . sacubitril-valsartan (ENTRESTO) 49-51 MG Take 1 tablet by mouth 2 (two) times daily. 60 tablet 3  . spironolactone (ALDACTONE) 25 MG tablet Take 1 tablet (25 mg total) by mouth daily. 90 tablet 3  . torsemide (DEMADEX) 20 MG tablet Take 40 mg by mouth 2 (two) times daily.    . carvedilol (COREG) 6.25 MG tablet Take 6.25 mg by mouth 2 (two) times daily with a meal.    . pantoprazole (PROTONIX) 40 MG tablet take 1 tablet by mouth once daily (Patient not taking: Reported on 12/26/2015) 30 tablet 1  . QUEtiapine (SEROQUEL) 400 MG tablet Take 400 mg by mouth at bedtime.      No current facility-administered medications for this encounter.     Allergies  Allergen Reactions  . Heparin Other (See Comments)    Documented HIT under problem list Pt states he's not allergic. " They told me not take it anymore"  . Thorazine [Chlorpromazine Hcl] Other (See Comments)    Body freezes up     Vitals:   12/26/15 1510  BP: (!) 142/90  Pulse: 68  SpO2: 99%  Weight: 276  lb (125.2 kg)   Wt Readings from Last 3 Encounters:  12/26/15 276 lb (125.2 kg)  12/23/15 276 lb 9.6 oz (125.5 kg)  12/20/15 270 lb 12.8 oz (122.8 kg)     PHYSICAL EXAM: General: NAD. Ambulated in the clinic without difficulty.   HEENT: normal Neck: supple. JVP hard to see. Does not appear elevated Carotids 2+ bilat; no bruits. No lymphadenopathy or thryomegaly appreciated. Cor: PMI nondisplaced. Regular rate & rhythm. No rubs, gallops, soft TR murmur. Lungs: clear Abdomen: Obese, soft, NT, ND, no HSM. No bruits or masses. +BS   Extremities: no cyanosis, clubbing, rash, R and LLE trace edema  Neuro: alert & oriented x 3, cranial nerves grossly intact. moves all 4 extremities w/o difficulty. Flat Affect   ASSESSMENT /PLAN 1) Chronic combined systolic/diastolic HF: EF 62-26%, grade II DD (10/2012) now decreased to 20-25%  (05/2015) RV severely reduced. Echo today EF 30-35% RV ok. Had [place] 2014 with minimal CAD.  -NYHA II.  Volume status stable. Continue torsemide 60 mg daily.  Continue spironolactone 25 mg daily. - Restart carvedilol a 6.25 mg BID (has been on 25 bid but HR running low) - Increase Entresto to 97-103 mg BID.  - Would not pursue ICD at this as EF appears to be improving and medication compliance not optimal. Will repeat echo in 3-4 months and if EF < 35% consider ICD. (He would be ok with proceeding if needed)  2) HTN - BP up will increase Entresto  3) PAF:  - NSR by EKG.  - Continue Xarelto 20 mg daily.  4) ETOH abuse-  -Remains abstinent 5) CKD stage II- creatinine normal on 12/24/15  6) ?OSA - Missed appt for sleep study and says he doesn't want to reschedule  7) DMII- Followed by PCP. 8) Medication Noncompliance - Reminded him of need to be compliant    Bensimhon, Daniel MD 3:13 PM

## 2015-12-26 NOTE — Progress Notes (Signed)
  Echocardiogram 2D Echocardiogram has been performed.  [name] [name] 12/26/2015, 3:10 PM

## 2015-12-26 NOTE — Addendum Note (Signed)
Encounter addended by: Kennieth Rad, RN on: 12/26/2015  3:35 PM<BR>    Actions taken: Order list changed, Diagnosis association updated, Sign clinical note

## 2015-12-27 ENCOUNTER — Ambulatory Visit: Payer: Self-pay

## 2015-12-30 ENCOUNTER — Encounter (INDEPENDENT_AMBULATORY_CARE_PROVIDER_SITE_OTHER): Payer: Self-pay

## 2015-12-30 ENCOUNTER — Ambulatory Visit: Payer: Medicare Other | Admitting: Dietician

## 2015-12-30 ENCOUNTER — Ambulatory Visit (INDEPENDENT_AMBULATORY_CARE_PROVIDER_SITE_OTHER): Payer: Medicare Other | Admitting: Internal Medicine

## 2015-12-30 VITALS — BP 141/81 | Temp 98.3°F | Wt 273.1 lb

## 2015-12-30 DIAGNOSIS — E1165 Type 2 diabetes mellitus with hyperglycemia: Secondary | ICD-10-CM | POA: Diagnosis not present

## 2015-12-30 DIAGNOSIS — E1136 Type 2 diabetes mellitus with diabetic cataract: Secondary | ICD-10-CM

## 2015-12-30 DIAGNOSIS — Z794 Long term (current) use of insulin: Secondary | ICD-10-CM | POA: Diagnosis not present

## 2015-12-30 LAB — GLUCOSE, CAPILLARY: Glucose-Capillary: 231 mg/dL — ABNORMAL HIGH (ref 65–99)

## 2015-12-30 LAB — POCT GLYCOSYLATED HEMOGLOBIN (HGB A1C): HEMOGLOBIN A1C: 11.8

## 2015-12-30 MED ORDER — INSULIN LISPRO 100 UNIT/ML (KWIKPEN): 15.0000 [IU] | PEN_INJECTOR | Freq: Three times a day (TID) | SUBCUTANEOUS | 11 refills | Status: DC

## 2015-12-30 MED ORDER — INSULIN GLARGINE 100 UNIT/ML SOLOSTAR PEN: 55.0000 [IU] | PEN_INJECTOR | Freq: Every day | SUBCUTANEOUS | 11 refills | Status: DC

## 2015-12-30 MED ORDER — METFORMIN HCL 1000 MG PO TABS: 1000.0000 mg | ORAL_TABLET | Freq: Two times a day (BID) | ORAL | 11 refills | Status: DC

## 2015-12-30 MED ORDER — SITAGLIPTIN PHOSPHATE 100 MG PO TABS: 100.0000 mg | ORAL_TABLET | Freq: Every day | ORAL | 4 refills | Status: DC

## 2015-12-30 NOTE — Patient Instructions (Signed)
Start taking:  1. Januvia 100mg  daily 2. Metforming 1000mg  twice a day 3. Lantus 55 units once a day 4. Humalog 15 units with meals  Bring your glucometer to your next visit in 1 month

## 2015-12-30 NOTE — Assessment & Plan Note (Addendum)
Assessment: Patient here for hospital follow-up for diabetes. Glucometer report reveals average glucose reading of 352 with lowest reading of 110. He is on lantus 50 units daily, humalog 15 units TID w/ meals, and metformin. His med list notes 500mg  BID and pt cannot recall what does he is on.   Plan: Hemoglobin A1c checked today and it was 11.8. He has an appt w/ Butch Penny. Increase lantus to 55 units daily and rx for metformin 1000mg  BID. Renal function is WNL, started on januvia 100mg  daily. F/u in 1 month for glucometer review and optimization of insulin regimen.

## 2015-12-30 NOTE — Progress Notes (Signed)
CC: DM  HPI:  [name] [name] is a 60 y.o. with past medical history as outlined below who presents to clinic for hospital follow-up. He was hospitalized from 11/21-23 for hyperosmotic hyperglycemic state. Problem list for patient's chronic medical issues.   Past Medical History:  Diagnosis Date  . Atrial fibrillation ([place])   . Cancer [facility])    Prostate cancer-bx. 3 weeks ago  . Cataract   . Chronic combined systolic and diastolic CHF (congestive heart failure) (HCC)    a) EF 40-45% per 2D echo (02/2012) with grade 1 DD b)  NICM c) RHC (04/2012): RA: 4, RV 45/3/4, PA 42/9 (24), PCWP 14, Fick CO/CI: 5.2 /2.2, PVR 1.9 WU, PA 62% and 64% d) ECHO (10/2012) EF 40-45%, grade II DD, RV nl  . CKD (chronic kidney disease) stage 2, GFR 60-89 ml/min    BL SCr approximately 1-1.3  . Colitis 05/2009   History of colitis of ascending colon noted on CT abd/pelvis (05/2009), with interval resolution with subsequent CT  . Continuous chronic alcoholism ([place])   . Degenerative lumbar spinal stenosis    s/p L2-3, L3-4, L4-5 laminectomy partial facetectomy, and bilateral foraminotomy  . Diabetes mellitus without complication ([place])    Type II  . Dysrhythmia    A. Fib  . Family history of adverse reaction to anesthesia    "sister, can't go to sleep"  . GERD (gastroesophageal reflux disease)   . H/O cocaine abuse   . Hepatic steatosis    suspected 2/2 alcohol abuse  . Hepatitis C   . History of pancreatitis 01/2011   Admission for acute pancreatitis presumed 2/2 ongoing alcohol abuse- and hypertriglyceridemia  . History of pneumonia   . HIT (heparin-induced thrombocytopenia) ([place])   . Hyperlipidemia   . Hypertension    uncontrolled with medication noncompliance  . Hypertriglyceridemia   . NICM (nonischemic cardiomyopathy) ([place])    a. LHC (04/2012): nl arteries  . PFO (patent foramen ovale) 01/2012   with right to left shunt, noted per TEE in evaluation for source of embolic stroke in 04/1658    . Rhabdomyolysis 02/22/2012   H/O rhabdomyolysis in 01/2012 that was idiopathic, cause never identified  . Schizophrenia, schizo-affective ([place])   . Shortness of breath dyspnea   . Splenic cyst   . Stroke ([place]) 01/2012   Small cerebellar infarcts right greater than left as well as questionable acute left external capsule and caudate nuclear punctate lacunar infarcts noted per MRI (01/2012) - presumed to be embolic likely source PFO with right to left shunt (noted per TEE 01/ 2014)    Review of Systems:  Denies polyuria, polydipsia, and chest pain.   Physical Exam:  Vitals:   12/30/15 1014  BP: (!) 141/81  Temp: 98.3 F (36.8 C)  TempSrc: Oral  SpO2: 100%  Weight: 273 lb 1.6 oz (123.9 kg)   P=Physical Exam  Constitutional: He appears well-developed and well-nourished. No distress.  HENT:  Head: Normocephalic and atraumatic.  Nose: Nose normal.  Cardiovascular: Normal rate and regular rhythm.  Exam reveals no gallop and no friction rub.   No murmur heard. Pulmonary/Chest: Effort normal and breath sounds normal. No respiratory distress. He has no wheezes. He has no rales.  Abdominal: Soft. Bowel sounds are normal. He exhibits no distension. There is no tenderness. There is no rebound and no guarding.  Skin: Skin is warm and dry. No rash noted. He is not diaphoretic. No erythema. No pallor.    Assessment & Plan:  See Encounters Tab for problem based charting.  Patient discussed with Dr. Lynnae January

## 2015-12-31 ENCOUNTER — Telehealth: Payer: Self-pay | Admitting: Internal Medicine

## 2015-12-31 NOTE — Telephone Encounter (Signed)
   Reason for call:   I received a call from [name] at 10:11 PM indicating his concern for "low blood sugar.".   Pertinent Data:   He recently checked his blood sugar and it read 150. He is concerned about whether or not he should take home glargine 55 units at bedtime.  Interestingly enough, he states he is taking Humalog 25 units before meals which is different than the 15 units which is listed on his medications list.  Per review of the chart, he was started on sitagliptin 100 mg at his office visit yesterday, 12/30/15. A1c was 11.8 at that time.  He denies ever having a reading of less than 70 and his fasting blood sugar this morning was 150.    Assessment / Plan / Recommendations:   Reassured him that he should be okay with his current dose of glargine only because it is long-acting insulin and should not drop his blood sugar to a dangerously low level.  Revisited the symptoms of hypoglycemia with him and inquired as to how he would correct the symptoms if he needed to do so emergently. He tells me he thinks he has some hard candy on hand, and I encouraged him to take that, preferably Smarties, instead of chocolate cake or peanut butter which he was previously using.  I advised him to call back tomorrow morning to consider adjusting his mealtime insulin dose only because if he is having CBGs trending in the low 100s, he may require some adjustment of his mealtime insulin back to 15 units.  To improve his adherence, we can consider combining metformin and sitagliptin down the road.  As always, pt is advised that if symptoms worsen or new symptoms arise, they should go to an urgent care facility or to to ER for further evaluation.   [name] Dubin, MD   12/31/2015, 10:13 PM

## 2015-12-31 NOTE — Progress Notes (Signed)
Internal Medicine Clinic Attending  Case discussed with Dr. Truong at the time of the visit.  We reviewed the resident's history and exam and pertinent patient test results.  I agree with the assessment, diagnosis, and plan of care documented in the resident's note.  

## 2016-01-02 ENCOUNTER — Other Ambulatory Visit: Payer: Self-pay

## 2016-01-02 MED ORDER — GLUCOSE BLOOD VI STRP: ORAL_STRIP | 3 refills | Status: DC

## 2016-01-02 NOTE — Telephone Encounter (Signed)
glucose blood (ONE TOUCH ULTRA TEST) test strip, refill request @ [facility] on [address].

## 2016-01-03 ENCOUNTER — Other Ambulatory Visit: Payer: Self-pay | Admitting: Internal Medicine

## 2016-01-03 NOTE — Telephone Encounter (Signed)
Pt called / informed rx for test strips was refilled yesterday.

## 2016-01-03 NOTE — Telephone Encounter (Signed)
A message about our upcoming appointment on 01/07/16 reminding him to bring his meter and write down his insulin  Doses on Monday and food intake and bring that with him as well.

## 2016-01-03 NOTE — Telephone Encounter (Signed)
Refill Request glucose blood (ONE TOUCH ULTRA TEST) test strip

## 2016-01-04 ENCOUNTER — Other Ambulatory Visit: Payer: Self-pay | Admitting: Internal Medicine

## 2016-01-04 DIAGNOSIS — E1136 Type 2 diabetes mellitus with diabetic cataract: Secondary | ICD-10-CM

## 2016-01-04 DIAGNOSIS — Z794 Long term (current) use of insulin: Principal | ICD-10-CM

## 2016-01-04 NOTE — Telephone Encounter (Signed)
Paged by patient around 12:50 pm. I called him back immediately. Patient informed me he is out of his diabetes blood glucose testing strips. States the strips were called into his pharmacy by his PCP on 01/02/16. However, he is not able to pick them up because his insurance is not paying for them. States he is checking his blood glucose more frequently, and as a result, has ran out of testing strips early. I called his pharmacy and spoke to pharmacist Juliann Pulse). She informed me that the patient's insurance will not pay for his testing strips until 01/13/16. I requested the pharmacist to call the patient's insurance company to get an override. I called again and updated the patient. Advised the pharmacist to update the patient as well.

## 2016-01-04 NOTE — Telephone Encounter (Signed)
Patient paged again at at approximately 1:30 pm stating he was informed by his pharmacy that his insurance company will not pay for his testing strips. Patient stated he already checked his CBG today and it was around 140. He has 1 strip left at home. Advised him to use his last testing strip tomorrow and then come into the clinic on Monday to get samples from [place]. Patient agreed with the plan.

## 2016-01-04 NOTE — Telephone Encounter (Signed)
Thank you Vasu.Marland Kitchen Please let me now the outcome of this

## 2016-01-06 ENCOUNTER — Telehealth: Payer: Self-pay | Admitting: Dietician

## 2016-01-06 ENCOUNTER — Ambulatory Visit (INDEPENDENT_AMBULATORY_CARE_PROVIDER_SITE_OTHER): Payer: Medicare Other | Admitting: Dietician

## 2016-01-06 DIAGNOSIS — Z6838 Body mass index (BMI) 38.0-38.9, adult: Secondary | ICD-10-CM | POA: Diagnosis not present

## 2016-01-06 DIAGNOSIS — Z713 Dietary counseling and surveillance: Secondary | ICD-10-CM

## 2016-01-06 DIAGNOSIS — E1165 Type 2 diabetes mellitus with hyperglycemia: Secondary | ICD-10-CM

## 2016-01-06 DIAGNOSIS — Z794 Long term (current) use of insulin: Secondary | ICD-10-CM

## 2016-01-06 DIAGNOSIS — E1136 Type 2 diabetes mellitus with diabetic cataract: Secondary | ICD-10-CM

## 2016-01-06 MED ORDER — ONETOUCH DELICA LANCETS 33G MISC: 5 refills | Status: DC

## 2016-01-06 MED ORDER — GLUCOSE BLOOD VI STRP: ORAL_STRIP | 5 refills | Status: DC

## 2016-01-06 NOTE — Patient Instructions (Signed)
Drinks- best to have clear/light diet soda like ginger ale, diet 7 up/ diet lemon lime or seltzer  Mayonnaise- - best to buy light mayonnaise and to limit to 1 tablespoon or the tip of your thumb amount   Wear his new sock the next time you come    Diabetes and Foot Care Diabetes may cause you to have problems because of poor blood supply (circulation) to your feet and legs. This may cause the skin on your feet to become thinner, break easier, and heal more slowly. Your skin may become dry, and the skin may peel and crack. You may also have nerve damage in your legs and feet causing decreased feeling in them. You may not notice minor injuries to your feet that could lead to infections or more serious problems. Taking care of your feet is one of the most important things you can do for yourself. Follow these instructions at home:  Wear shoes at all times, even in the house. Do not go barefoot. Bare feet are easily injured.  Check your feet daily for blisters, cuts, and redness. If you cannot see the bottom of your feet, use a mirror or ask someone for help.  Wash your feet with warm water (do not use hot water) and mild soap. Then pat your feet and the areas between your toes until they are completely dry. Do not soak your feet as this can dry your skin.  Apply a moisturizing lotion or petroleum jelly (that does not contain alcohol and is unscented) to the skin on your feet and to dry, brittle toenails. Do not apply lotion between your toes.  Trim your toenails straight across. Do not dig under them or around the cuticle. File the edges of your nails with an emery board or nail file.  Do not cut corns or calluses or try to remove them with medicine.  Wear clean socks or stockings every day. Make sure they are not too tight. Do not wear knee-high stockings since they may decrease blood flow to your legs.  Wear shoes that fit properly and have enough cushioning. To break in new shoes, wear  them for just a few hours a day. This prevents you from injuring your feet. Always look in your shoes before you put them on to be sure there are no objects inside.  Do not cross your legs. This may decrease the blood flow to your feet.  If you find a minor scrape, cut, or break in the skin on your feet, keep it and the skin around it clean and dry. These areas may be cleansed with mild soap and water. Do not cleanse the area with peroxide, alcohol, or iodine.  When you remove an adhesive bandage, be sure not to damage the skin around it.  If you have a wound, look at it several times a day to make sure it is healing.  Do not use heating pads or hot water bottles. They may burn your skin. If you have lost feeling in your feet or legs, you may not know it is happening until it is too late.  Make sure your health care provider performs a complete foot exam at least annually or more often if you have foot problems. Report any cuts, sores, or bruises to your health care provider immediately. Contact a health care provider if:  You have an injury that is not healing.  You have cuts or breaks in the skin.  You have an ingrown nail.  You notice redness on your legs or feet.  You feel burning or tingling in your legs or feet.  You have pain or cramps in your legs and feet.  Your legs or feet are numb.  Your feet always feel cold. Get help right away if:  There is increasing redness, swelling, or pain in or around a wound.  There is a red line that goes up your leg.  Pus is coming from a wound.  You develop a fever or as directed by your health care provider.  You notice a bad smell coming from an ulcer or wound.  Document Released: 01/10/2000 Document Revised: 06/20/2015 Document Reviewed: 06/21/2012 Elsevier Interactive Patient Education  2017 [employer].

## 2016-01-06 NOTE — Progress Notes (Signed)
Diabetes Self-Management Education  Visit Type:  Follow-up - patient last seen in 2016 and 2015. Did not complete training at this time Appt. Start Time: 850 Appt. End Time: 1000  01/06/2016  [name], identified by name and date of birth, is a 60 y.o. male with a diagnosis of Diabetes:  .   ASSESSMENT  [name] is here with his sister. She says she drops in on him and he does have access to transportation. He has an aide who comes 7 days a week for 2 hours a day.Visual foot inspection done today- patient's feel are very callused and dry. He may benefit from [facility] in home support. Will ask socai work about this  Weight 275 lb (124.7 kg). He would bewenfit from weight loss.  Body mass index is 38.35 kg/m.       Diabetes Self-Management Education - 01/06/16 1400      Health Coping   How would you rate your overall health? Good     Psychosocial Assessment   Patient Belief/Attitude about Diabetes Motivated to manage diabetes   Self-care barriers Lack of material resources;Other (comment)   Self-management support Doctor's office;Family;CDE visits   Patient Concerns Monitoring;Glycemic Control   Special Needs Verbal instruction   Preferred Learning Style Auditory;Visual;Hands on   Learning Readiness Contemplating     Pre-Education Assessment   Patient understands the diabetes disease and treatment process. Demonstrates understanding / competency   Patient understands incorporating nutritional management into lifestyle. Needs Review   Patient undertands incorporating physical activity into lifestyle. Needs Review   Patient understands using medications safely. Demonstrates understanding / competency   Patient understands monitoring blood glucose, interpreting and using results Needs Review   Patient understands prevention, detection, and treatment of acute complications. Needs Review   Patient understands prevention, detection, and treatment of chronic complications. Needs  Review   Patient understands how to develop strategies to address psychosocial issues. Demonstrates understanding / competency   Patient understands how to develop strategies to promote health/change behavior. Needs Review     Complications   Last HgB A1C per patient/outside source 11.8 %   How often do you check your blood sugar? > 4 times/day  but only for past 2 weeks   Fasting Blood glucose range (mg/dL) 70-129;180-200   Postprandial Blood glucose range (mg/dL) 70-129;130-179;180-200;>200   Number of hypoglycemic episodes per month --  0   Number of hyperglycemic episodes per week --  7   Have you had a dilated eye exam in the past 12 months? No  sister says she will arrange for him to go to Dr. Shirley [name]   Have you had a dental exam in the past 12 months? No   Are you checking your feet? No  podiatry referal made and pt missed two appointments     Dietary Intake   Breakfast eggs x2, gruits, toast x1, someimes sausage  aide comes daily from 8-10AM and often cooks this   Snack (morning) fruit   Lunch 1.5 sandwiches- bologna or peanut butter, water  sometimes leftovers   Snack (afternoon) fruit   Dinner --  starch, meat and vegetables, bread, recently switched to ww   Beverage(s) recently switched from regular soda to diet soda and water     Exercise   Exercise Type ADL's  interested in walking in gym   How many days per week to you exercise? 0  0   How many minutes per day do you exercise? 0  0  Total minutes per week of exercise 0     Patient Education   Previous Diabetes Education Yes (please comment)   Nutrition management  Role of diet in the treatment of diabetes and the relationship between the three main macronutrients and blood glucose level;Carbohydrate counting   Physical activity and exercise  Role of exercise on diabetes management, blood pressure control and cardiac health.;Other (comment)   Monitoring Purpose and frequency of SMBG.;Daily foot exams;Yearly  dilated eye exam   Chronic complications Assessed and discussed foot care and prevention of foot problems;Retinopathy and reason for yearly dilated eye exams   Personal strategies to promote health Helped patient develop diabetes management plan for improving his blood sugars and caring for his feet      Subsequent Visit   Since your last visit have you continued or begun to take your medications as prescribed? Yes   Since your last visit have you had your blood pressure checked? No   Since your last visit have you experienced any weight changes? No change   Since your last visit, are you checking your blood glucose at least once a day? Yes      Learning Objective:  Patient will have a greater understanding of diabetes self-management. Patient education plan is to attend individual and/or group sessions per assessed needs and concerns. My plan to support myself in continuing these changes to care for my diabetes is to attend or contact:   Tell my aide what I learning today when she comes tomorrow  Exercise ? Other: Silver Sneakers after approved by heart doctor Diabetes Support Groups ?Type 1 diabetes support group Type 2 diabetes support group : 2nd Monday of every month from 6-7 PM at [address] [facility] conference room [phone] Other ?  local support resources -doctor's office, CDE, Dietitian, pharmacist, church   Plan:   Patient Instructions  Drinks- best to have clear/light diet soda like ginger ale, diet 7 up/ diet lemon lime or seltzer  Mayonnaise- - best to buy light mayonnaise and to limit to 1 tablespoon or the tip of your thumb amount   Wear his new sock the next time you come    Diabetes and Foot Care Diabetes may cause you to have problems because of poor blood supply (circulation) to your feet and legs. This may cause the skin on your feet to become thinner, break easier, and heal more slowly. Your skin may become dry, and the skin may peel and  crack. You may also have nerve damage in your legs and feet causing decreased feeling in them. You may not notice minor injuries to your feet that could lead to infections or more serious problems. Taking care of your feet is one of the most important things you can do for yourself. Follow these instructions at home:  Wear shoes at all times, even in the house. Do not go barefoot. Bare feet are easily injured.  Check your feet daily for blisters, cuts, and redness. If you cannot see the bottom of your feet, use a mirror or ask someone for help.  Wash your feet with warm water (do not use hot water) and mild soap. Then pat your feet and the areas between your toes until they are completely dry. Do not soak your feet as this can dry your skin.  Apply a moisturizing lotion or petroleum jelly (that does not contain alcohol and is unscented) to the skin on your feet and to dry, brittle toenails. Do not  apply lotion between your toes.  Trim your toenails straight across. Do not dig under them or around the cuticle. File the edges of your nails with an emery board or nail file.  Do not cut corns or calluses or try to remove them with medicine.  Wear clean socks or stockings every day. Make sure they are not too tight. Do not wear knee-high stockings since they may decrease blood flow to your legs.  Wear shoes that fit properly and have enough cushioning. To break in new shoes, wear them for just a few hours a day. This prevents you from injuring your feet. Always look in your shoes before you put them on to be sure there are no objects inside.  Do not cross your legs. This may decrease the blood flow to your feet.  If you find a minor scrape, cut, or break in the skin on your feet, keep it and the skin around it clean and dry. These areas may be cleansed with mild soap and water. Do not cleanse the area with peroxide, alcohol, or iodine.  When you remove an adhesive bandage, be sure not to damage the  skin around it.  If you have a wound, look at it several times a day to make sure it is healing.  Do not use heating pads or hot water bottles. They may burn your skin. If you have lost feeling in your feet or legs, you may not know it is happening until it is too late.  Make sure your health care provider performs a complete foot exam at least annually or more often if you have foot problems. Report any cuts, sores, or bruises to your health care provider immediately. Contact a health care provider if:  You have an injury that is not healing.  You have cuts or breaks in the skin.  You have an ingrown nail.  You notice redness on your legs or feet.  You feel burning or tingling in your legs or feet.  You have pain or cramps in your legs and feet.  Your legs or feet are numb.  Your feet always feel cold. Get help right away if:  There is increasing redness, swelling, or pain in or around a wound.  There is a red line that goes up your leg.  Pus is coming from a wound.  You develop a fever or as directed by your health care provider.  You notice a bad smell coming from an ulcer or wound.  Document Released: 01/10/2000 Document Revised: 06/20/2015 Document Reviewed: 06/21/2012 Elsevier Interactive Patient Education  2017 [employer]     Expected Outcomes:     Education material provided: My Plate  If problems or questions, patient to contact team via:  Phone  Future DSME appointment: -   3 weeks

## 2016-01-06 NOTE — Telephone Encounter (Signed)
His sister calls to lt [place] know that he will be able to get some test strips tomorrow. Mailed letter to patient asking him to reschedule his missed podiatry appointments with [facility]. Also called sister who says she will follow up and be sure her reschedules and gets to the appointment.

## 2016-01-07 ENCOUNTER — Ambulatory Visit: Payer: Medicare Other | Admitting: Dietician

## 2016-01-09 ENCOUNTER — Telehealth: Payer: Self-pay | Admitting: Licensed Clinical Social Worker

## 2016-01-09 NOTE — Telephone Encounter (Signed)
[name] was referred to [place] by [facility] CDE for community care management.  CDE states "His feet were in bad shape- extremely dry to the point of cracking and huge calluses of dry skin build up (...). He has mental illness, and needs support to follow though on appointments and such. Missed two podiatry appointments. Was not checking his blood sugar until a few weeks ago (concern pt is not taking his medications as prescribed). A1C when he takes meds is 6 and now 11.8%.  I think he does well as long as he has adequate support. He has a supportive sister now who came to his visit, but I am not sure how much she can help." Upon CSW chart review, pt is eligible for either [facility]N or P4CC.   CSW placed call to [name].  Pt states he receives PCS 7 days a week, but does not remember the name of the agency.  CSW discussed benefits and availability of community care management.  [name] response "I don't think that's needed...  I'm just got Silver Sneakers so I won't be home much... I have transportation already set up."  Pt declines need.  CSW informed [name] of program availability and to notify our office when ready for referral for community care management.  Pt denies add'l social work needs at this time.

## 2016-01-10 DIAGNOSIS — M545 Low back pain: Secondary | ICD-10-CM | POA: Diagnosis not present

## 2016-01-10 DIAGNOSIS — G894 Chronic pain syndrome: Secondary | ICD-10-CM | POA: Diagnosis not present

## 2016-01-16 ENCOUNTER — Telehealth: Payer: Self-pay | Admitting: Internal Medicine

## 2016-01-16 NOTE — Telephone Encounter (Signed)
Patient thinks his medicines are constipated him and would like a call back.

## 2016-01-22 DIAGNOSIS — L600 Ingrowing nail: Secondary | ICD-10-CM | POA: Diagnosis not present

## 2016-01-23 NOTE — Telephone Encounter (Signed)
Called pt and ask him to speak w/ his pharmacist and see what otc would help, then to speak w/ dr Dareen Piano at his next appt if it persists, he is agreeable

## 2016-01-30 ENCOUNTER — Ambulatory Visit: Payer: Self-pay | Admitting: Dietician

## 2016-02-05 ENCOUNTER — Ambulatory Visit: Payer: Self-pay | Admitting: Dietician

## 2016-02-05 DIAGNOSIS — G894 Chronic pain syndrome: Secondary | ICD-10-CM | POA: Diagnosis not present

## 2016-02-05 DIAGNOSIS — M545 Low back pain: Secondary | ICD-10-CM | POA: Diagnosis not present

## 2016-02-19 ENCOUNTER — Ambulatory Visit: Payer: Self-pay | Admitting: Dietician

## 2016-02-25 ENCOUNTER — Ambulatory Visit (INDEPENDENT_AMBULATORY_CARE_PROVIDER_SITE_OTHER): Payer: Medicare Other | Admitting: Internal Medicine

## 2016-02-25 ENCOUNTER — Ambulatory Visit (INDEPENDENT_AMBULATORY_CARE_PROVIDER_SITE_OTHER): Payer: Medicare Other | Admitting: Dietician

## 2016-02-25 ENCOUNTER — Encounter: Payer: Self-pay | Admitting: Internal Medicine

## 2016-02-25 VITALS — BP 142/99 | HR 66 | Temp 98.0°F | Ht 71.0 in | Wt 284.4 lb

## 2016-02-25 DIAGNOSIS — I10 Essential (primary) hypertension: Secondary | ICD-10-CM

## 2016-02-25 DIAGNOSIS — I5020 Unspecified systolic (congestive) heart failure: Secondary | ICD-10-CM

## 2016-02-25 DIAGNOSIS — Z7901 Long term (current) use of anticoagulants: Secondary | ICD-10-CM

## 2016-02-25 DIAGNOSIS — I130 Hypertensive heart and chronic kidney disease with heart failure and stage 1 through stage 4 chronic kidney disease, or unspecified chronic kidney disease: Secondary | ICD-10-CM | POA: Diagnosis not present

## 2016-02-25 DIAGNOSIS — E1122 Type 2 diabetes mellitus with diabetic chronic kidney disease: Secondary | ICD-10-CM | POA: Diagnosis not present

## 2016-02-25 DIAGNOSIS — E1136 Type 2 diabetes mellitus with diabetic cataract: Secondary | ICD-10-CM

## 2016-02-25 DIAGNOSIS — E119 Type 2 diabetes mellitus without complications: Secondary | ICD-10-CM | POA: Diagnosis not present

## 2016-02-25 DIAGNOSIS — Z794 Long term (current) use of insulin: Secondary | ICD-10-CM

## 2016-02-25 DIAGNOSIS — F209 Schizophrenia, unspecified: Secondary | ICD-10-CM

## 2016-02-25 DIAGNOSIS — I5022 Chronic systolic (congestive) heart failure: Secondary | ICD-10-CM

## 2016-02-25 DIAGNOSIS — Z713 Dietary counseling and surveillance: Secondary | ICD-10-CM

## 2016-02-25 DIAGNOSIS — N182 Chronic kidney disease, stage 2 (mild): Secondary | ICD-10-CM

## 2016-02-25 DIAGNOSIS — G5603 Carpal tunnel syndrome, bilateral upper limbs: Secondary | ICD-10-CM

## 2016-02-25 DIAGNOSIS — Z0000 Encounter for general adult medical examination without abnormal findings: Secondary | ICD-10-CM

## 2016-02-25 DIAGNOSIS — Z79899 Other long term (current) drug therapy: Secondary | ICD-10-CM

## 2016-02-25 DIAGNOSIS — Z87891 Personal history of nicotine dependence: Secondary | ICD-10-CM

## 2016-02-25 DIAGNOSIS — I480 Paroxysmal atrial fibrillation: Secondary | ICD-10-CM

## 2016-02-25 LAB — GLUCOSE, CAPILLARY: GLUCOSE-CAPILLARY: 97 mg/dL (ref 65–99)

## 2016-02-25 MED ORDER — SITAGLIPTIN PHOSPHATE 100 MG PO TABS: 100.0000 mg | ORAL_TABLET | Freq: Every day | ORAL | 4 refills | Status: DC

## 2016-02-25 NOTE — Addendum Note (Signed)
Addended by: Aldine Contes on: 02/25/2016 02:26 PM   Modules accepted: Orders

## 2016-02-25 NOTE — Progress Notes (Signed)
Diabetes Self-Management Education  Visit Type:  Follow-up 2  Appt. Start Time: 945 Appt. End Time: 9244  02/25/2016  [name] [name], identified by name and date of birth, is a 61 y.o. male with a diagnosis of Diabetes:  Marland Kitchen  Type 2 diabetes ASSESSMENT  Weight- 284.4# increased from 275# at last visit, despite report of eating more vegetables and less starch. May be due to improved glucose control/decreased glycosuria.  Patient would benefit from weight loss, would Victoza be a consideration for this patient?  The Victoza may help him decrease his ned for the Humalog.      Diabetes Self-Management Education - 02/25/16 1000      Health Coping   How would you rate your overall health? Good     Psychosocial Assessment   Patient Belief/Attitude about Diabetes (P)  Motivated to manage diabetes   Self-care barriers (P)  Lack of material resources   Self-management support (P)  Doctor's office;Family;CDE visits   Patient Concerns (P)  Glycemic Control;Support   Special Needs (P)  Verbal instruction   Preferred Learning Style (P)  Auditory;Visual;Hands on   Learning Readiness (P)  Ready     Complications   How often do you check your blood sugar? (P)  3-4 times/day   Fasting Blood glucose range (mg/dL) (P)  70-129;130-179   Postprandial Blood glucose range (mg/dL) (P)  180-200;>200   Number of hypoglycemic episodes per month (P)  0   Number of hyperglycemic episodes per week (P)  4   Can you tell when your blood sugar is high? (P)  Yes   What do you do if your blood sugar is high? (P)  --  thirst,tired   Have you had a dilated eye exam in the past 12 months? (P)  No  scheduled for 03/11/16 today dr. Shirley Muscat   Have you had a dental exam in the past 12 months? (P)  No  he said he would schedule   Are you checking your feet? (P)  Yes     Dietary Intake   Lunch (P)  bologna sandwich & broccoli   Dinner (P)  greens, meat, mac & cheese     Exercise   Exercise Type (P)  ADL's   How many days per week to you exercise? (P)  0   How many minutes per day do you exercise? (P)  0   Total minutes per week of exercise (P)  0     Patient Education   Previous Diabetes Education (P)  Yes (here)   Disease state  (P)  Definition of diabetes, type 1 and 2, and the diagnosis of diabetes   Acute complications (P)  Taught treatment of hypoglycemia - the 15 rule.;Discussed and identified patients' treatment of hyperglycemia.;Covered sick day management with medication and food.     Subsequent Visit   Since your last visit have you continued or begun to take your medications as prescribed? Yes   Since your last visit have you had your blood pressure checked? Yes   Is your most recent blood pressure lower, unchanged, or higher since your last visit? Unchanged   Since your last visit have you experienced any weight changes? (P)  Gain   Weight Gain (lbs) (P)  9   Since your last visit, are you checking your blood glucose at least once a day? (P)  Yes      Learning Objective:  Patient will have a greater understanding of diabetes self-management. Patient education plan is  to attend individual and/or group sessions per assessed needs and concerns.  My plan to support myself in continuing these changes to care for my diabetes is to attend or contact:   Exercise  ? Add local gym and fitness center as an option- [employer] when he is ready to start silver sneakers   Plan:   Patient Instructions  1- Your appointment with Dr. Shirley Muscat [phone] Wednesday March 11, 2016 3:00 PM  2- Schedule dentist appointment  3- Maintain blood sugar by:      -Eating right     -Checking blood sugar     -Taking medicine     -Being active - remember you can walk around your block and house to be active, you do not have to wait to go to the [facility].   Your blood sugars are excellent and you did great at checking   your feet and blood sugar! Congratulations!!!  Expected Outcomes:     Education  material provided: A1C conversion sheet  If problems or questions, patient to contact team via:  Phone  Future DSME appointment: -   6 weeks [name], [place] 02/25/2016 1:55 PM.

## 2016-02-25 NOTE — Assessment & Plan Note (Signed)
BP Readings from Last 3 Encounters:  02/25/16 (!) 142/99  12/30/15 (!) 141/81  12/26/15 (!) 142/90    Lab Results  Component Value Date   NA 135 12/24/2015   K 4.6 12/24/2015   CREATININE 0.94 12/24/2015    Assessment: Blood pressure control:  fair Progress toward BP goal:   near goal Comments: patient is compliant with his aldactone 25 mg, carvedilol 6.25 mg bid but only occasionally compliant with his torsemide  Plan: Medications:  continue current medications Educational resources provided: brochure (denies need ) Self management tools provided: home blood pressure logbook Other plans: Patient encouraged to be compliant with his torsemide. Will check BMP

## 2016-02-25 NOTE — Assessment & Plan Note (Signed)
-   Patient with regular rate and rhythm on exam today - He states he is compliant with eliquis and is able to obtain this medication easily - c/w carvedilol - No further work up for now

## 2016-02-25 NOTE — Assessment & Plan Note (Addendum)
-   Continue with seroquel for now - Patient has an appointment with his psychiatrist this coming month - He is unable to recall his outside doctor's name (? Dr. Reece Levy). Will bring his name on his next appointment

## 2016-02-25 NOTE — Progress Notes (Signed)
   Subjective:    Patient ID: [name] [name], male    DOB: 13-Jan-1956, 61 y.o.   MRN: [id number]  HPI  I have seen and examined the patient. He is here for routine follow up of his DM and HTN,   Patient states that he is compliant with his medication and has noted a tremendous improvement in his blood sugars. He denies any hypoglycemic symptoms or episodes. He continues to work with the diabetes educator to improve his diet.  He follows with Dr. Haroldine Laws for his chronic systolic HF and states he has been compliant with most of his meds except he doesn't take his torsemide as regularly as he should. He does note some mild DOE but states he feels well most of the time.  He follows with an outside specialist for his schizophrenia and states his symptoms are well controlled on his current regimen but he has been waking up more at night and will discuss this with his outside doctor.   Review of Systems  Constitutional: Negative.   HENT: Negative.   Respiratory: Positive for shortness of breath. Negative for cough and wheezing.   Cardiovascular: Negative.   Gastrointestinal: Negative.   Musculoskeletal: Negative.   Neurological: Positive for numbness. Negative for dizziness, tremors and weakness.       Complains of b/l hand numbness intermittently  Psychiatric/Behavioral: Negative.        Objective:   Physical Exam  Constitutional: He is oriented to person, place, and time. He appears well-developed and well-nourished.  HENT:  Head: Normocephalic and atraumatic.  Mouth/Throat: No oropharyngeal exudate.  Neck: Neck supple.  Cardiovascular: Normal rate, regular rhythm and normal heart sounds.   Pulmonary/Chest: Effort normal and breath sounds normal. No respiratory distress. He has no wheezes.  Abdominal: Soft. Bowel sounds are normal. He exhibits no distension. There is no tenderness.  Musculoskeletal: Normal range of motion. He exhibits edema.  Trace b/l LE edema +    Lymphadenopathy:    He has no cervical adenopathy.  Neurological: He is alert and oriented to person, place, and time.  Psychiatric: He has a normal mood and affect. His behavior is normal.          Assessment & Plan:  Please see problem based charting for assessment and plan:

## 2016-02-25 NOTE — Assessment & Plan Note (Signed)
-   He still complains of mild DOE but states he feels well otherwise. No CP, no palpitations, no orthopnea, no PND, occasional mild LE swelling + - His weight is up 9 lbs from December - Lungs are clear to auscultation and he has only trace b/l LE edema - Patient to follow up with cardiology on Feb 28th - Encouraged to be compliant with his torsemide - He is compliant with his carvedilol and spirinolactone and states he is taking his Entresto but I do not see this on his active medication list. Will clarify this with cardiology

## 2016-02-25 NOTE — Assessment & Plan Note (Signed)
-   His last couple of BMPs have had a normal creatinine with a normal GFR - Will monitor for now - Check repeat BMP today

## 2016-02-25 NOTE — Patient Instructions (Addendum)
1- Your appointment with Dr. Shirley Muscat [phone] Wednesday March 11, 2016 3:00 PM  2- Schedule dentist appointment  3- Maintain blood sugar by:      -Eating right     -Checking blood sugar     -Taking medicine     -Being active - remember you can walk around your block and house to be active, you do not have to wait to go to the [facility].   Your blood sugars are excellent and you did great at checking   your feet and blood sugar! Congratulations!!!

## 2016-02-25 NOTE — Assessment & Plan Note (Signed)
Lab Results  Component Value Date   HGBA1C 11.8 12/30/2015   HGBA1C 6.1 (H) 08/28/2015   HGBA1C 6.1 (H) 05/28/2015     Assessment: Diabetes control:  fair Progress toward A1C goal:   improved Comments: Patient's blood sugars are now much improved with 74% of his blood sugars within goal and an average reading of 128. He is on metformin 1000 BID, januvia 100 mg, lantus 55 units and humalog 15 units pre meal  Plan: Medications:  continue current medications Home glucose monitoring: Frequency:   Timing:   Instruction/counseling given: reminded to get eye exam, reminded to bring blood glucose meter & log to each visit and discussed foot care Educational resources provided: other (see comments) (saw Educator today) Self management tools provided: copy of home glucose meter download Other plans: Patient to follow with his podiatrist (had left great toe nail removed)

## 2016-02-25 NOTE — Assessment & Plan Note (Signed)
-   Patient states that he got his flu and PNA vaccine at his [place], RN to call pharmacy to confirm

## 2016-02-25 NOTE — Patient Instructions (Signed)
-   It was a pleasure seeing you today - Your blood sugars are much better controlled. Keep up the great work! - Please follow up with me in 3 months - Please follow up with your foot doctor for your toe - Wrap guaze around the toe if you are going outside - Please follow up with your eye doctor for your eye exam

## 2016-02-25 NOTE — Assessment & Plan Note (Signed)
-   Patient with b/l intermittent hand tingling and numbness. Patient states his symptoms have gotten progressively work - He has a positive Tinel's sign on the right and a positive Phalen's sign - has a history of carpal tunnel but has not had surgery in the past - One wrist splint given to patient. Advised to get another wrist splint at the pharmacy and wear them at night - Will refer to neuro for nerve conduction studies. If no improvement may need surgery

## 2016-02-26 ENCOUNTER — Telehealth: Payer: Self-pay | Admitting: Internal Medicine

## 2016-02-26 ENCOUNTER — Telehealth: Payer: Self-pay | Admitting: *Deleted

## 2016-02-26 ENCOUNTER — Telehealth (HOSPITAL_COMMUNITY): Payer: Self-pay | Admitting: *Deleted

## 2016-02-26 LAB — BMP8+ANION GAP
Anion Gap: 15 mmol/L (ref 10.0–18.0)
BUN / CREAT RATIO: 15 (ref 10–24)
BUN: 13 mg/dL (ref 8–27)
CHLORIDE: 104 mmol/L (ref 96–106)
CO2: 21 mmol/L (ref 18–29)
CREATININE: 0.86 mg/dL (ref 0.76–1.27)
Calcium: 10.1 mg/dL (ref 8.6–10.2)
GFR calc non Af Amer: 94 mL/min/{1.73_m2} (ref >59)
GFR, EST AFRICAN AMERICAN: 108 mL/min/{1.73_m2} (ref >59)
GLUCOSE: 90 mg/dL (ref 65–99)
Potassium: 5.2 mmol/L (ref 3.5–5.2)
Sodium: 140 mmol/L (ref 134–144)

## 2016-02-26 NOTE — Telephone Encounter (Signed)
Call to patient's Pharmacy [place] to see last time patient had [facility] prescription filled.  Spoke with Pharmacist pt last filled on 09/09/2015.  Call to [facility] to see if patient is prescribed Entresto.  Spoke with Nira Conn who said that pat's last office visit 12/26/2015 patient was asked to increase dose to [address]103  And to take twice daily.  Patient at visit yesterday in Clinics was not taking the medication.  Asked if patient has an upcoming appointment in the [facility] -patient has an appointment scheduled for 03/25/2016.  Asked if it was felt that patient needed to come in sooner since he may not be taking the Entresto.  Heather agreed that patient would need to call for an earlier appointment.  Patient was called by me and asked if he was taking the Entresto at the increased dose.  Patient stated he was taking the original dose 1 time daily,  Stated he did not know about the increase in the dose. Patient was given the number to the [facility] and asked to call today to schedule an earlier appointment for follow up of the [facility].  Patient agreed to do so.  Dr. Dareen Piano was informed of the conversations with Pharmacy, patient and [facility].  Sander Nephew, RN 02/26/2016. 10:30 AM. Call to patient's sister [name] to see if she was aware of the medication change and patient's need to follow up in the [facility].   Message for her to call the Clinics.  Sander Nephew, RN 1/31/201810:46 AM.

## 2016-02-26 NOTE — Telephone Encounter (Signed)
Pt was sch to see Oda Kilts, PA on 2/8, he was advised to bring all medication bottles to that appt.

## 2016-02-26 NOTE — Telephone Encounter (Signed)
Called patient to discuss results of his blood work. I explained patient that his blood work results were within normal limits. No further workup for now. Patient is understanding and is in agreement with plan.

## 2016-02-26 NOTE — Telephone Encounter (Signed)
Gladys from pt's pcp called concerned about pt's entresto, she states pt is not on med currently, it is not on his med list and when she called his pharmacy they say he has not picked it up since Aug 2017.  Upon review of chart, at last OV 11/30 pt was on Etnresto 49/51 mg BID and we instructed pt to increase dose to 97/103 mg BID, however I do not see a prescription for that dose.  Regardless at this point does not seem pt is taking med, will arrange f/u appt soon to address.  Regino Schultze will advise pt to call our office for sooner appt.

## 2016-02-27 DIAGNOSIS — E119 Type 2 diabetes mellitus without complications: Secondary | ICD-10-CM | POA: Diagnosis not present

## 2016-02-27 DIAGNOSIS — L97521 Non-pressure chronic ulcer of other part of left foot limited to breakdown of skin: Secondary | ICD-10-CM | POA: Diagnosis not present

## 2016-02-27 DIAGNOSIS — T8189XA Other complications of procedures, not elsewhere classified, initial encounter: Secondary | ICD-10-CM | POA: Diagnosis not present

## 2016-02-27 DIAGNOSIS — Z79899 Other long term (current) drug therapy: Secondary | ICD-10-CM | POA: Diagnosis not present

## 2016-03-05 ENCOUNTER — Ambulatory Visit (HOSPITAL_COMMUNITY)
Admission: RE | Admit: 2016-03-05 | Discharge: 2016-03-05 | Disposition: A | Discharge status: Home / Self Care | Payer: Medicare Other | Source: Ambulatory Visit | Attending: Cardiology | Admitting: Cardiology

## 2016-03-05 ENCOUNTER — Telehealth (HOSPITAL_COMMUNITY): Payer: Self-pay | Admitting: Cardiology

## 2016-03-05 ENCOUNTER — Encounter (HOSPITAL_COMMUNITY): Payer: Self-pay

## 2016-03-05 VITALS — BP 135/84 | HR 95 | Temp 98.0°F | Wt 281.4 lb

## 2016-03-05 DIAGNOSIS — Z7982 Long term (current) use of aspirin: Secondary | ICD-10-CM | POA: Diagnosis not present

## 2016-03-05 DIAGNOSIS — Z87891 Personal history of nicotine dependence: Secondary | ICD-10-CM | POA: Diagnosis not present

## 2016-03-05 DIAGNOSIS — F1010 Alcohol abuse, uncomplicated: Secondary | ICD-10-CM | POA: Insufficient documentation

## 2016-03-05 DIAGNOSIS — Z8546 Personal history of malignant neoplasm of prostate: Secondary | ICD-10-CM | POA: Diagnosis not present

## 2016-03-05 DIAGNOSIS — Z8673 Personal history of transient ischemic attack (TIA), and cerebral infarction without residual deficits: Secondary | ICD-10-CM | POA: Diagnosis not present

## 2016-03-05 DIAGNOSIS — F209 Schizophrenia, unspecified: Secondary | ICD-10-CM | POA: Diagnosis not present

## 2016-03-05 DIAGNOSIS — B1920 Unspecified viral hepatitis C without hepatic coma: Secondary | ICD-10-CM | POA: Insufficient documentation

## 2016-03-05 DIAGNOSIS — E1122 Type 2 diabetes mellitus with diabetic chronic kidney disease: Secondary | ICD-10-CM | POA: Diagnosis not present

## 2016-03-05 DIAGNOSIS — K219 Gastro-esophageal reflux disease without esophagitis: Secondary | ICD-10-CM | POA: Insufficient documentation

## 2016-03-05 DIAGNOSIS — I130 Hypertensive heart and chronic kidney disease with heart failure and stage 1 through stage 4 chronic kidney disease, or unspecified chronic kidney disease: Secondary | ICD-10-CM | POA: Diagnosis not present

## 2016-03-05 DIAGNOSIS — Z794 Long term (current) use of insulin: Secondary | ICD-10-CM | POA: Diagnosis not present

## 2016-03-05 DIAGNOSIS — F1410 Cocaine abuse, uncomplicated: Secondary | ICD-10-CM | POA: Insufficient documentation

## 2016-03-05 DIAGNOSIS — M549 Dorsalgia, unspecified: Secondary | ICD-10-CM | POA: Insufficient documentation

## 2016-03-05 DIAGNOSIS — N179 Acute kidney failure, unspecified: Secondary | ICD-10-CM | POA: Insufficient documentation

## 2016-03-05 DIAGNOSIS — I480 Paroxysmal atrial fibrillation: Secondary | ICD-10-CM | POA: Insufficient documentation

## 2016-03-05 DIAGNOSIS — N182 Chronic kidney disease, stage 2 (mild): Secondary | ICD-10-CM | POA: Insufficient documentation

## 2016-03-05 DIAGNOSIS — I5020 Unspecified systolic (congestive) heart failure: Secondary | ICD-10-CM

## 2016-03-05 DIAGNOSIS — I2510 Atherosclerotic heart disease of native coronary artery without angina pectoris: Secondary | ICD-10-CM | POA: Insufficient documentation

## 2016-03-05 DIAGNOSIS — I10 Essential (primary) hypertension: Secondary | ICD-10-CM

## 2016-03-05 DIAGNOSIS — I429 Cardiomyopathy, unspecified: Secondary | ICD-10-CM | POA: Insufficient documentation

## 2016-03-05 DIAGNOSIS — Z9114 Patient's other noncompliance with medication regimen: Secondary | ICD-10-CM | POA: Diagnosis not present

## 2016-03-05 DIAGNOSIS — I4891 Unspecified atrial fibrillation: Secondary | ICD-10-CM | POA: Insufficient documentation

## 2016-03-05 DIAGNOSIS — M48061 Spinal stenosis, lumbar region without neurogenic claudication: Secondary | ICD-10-CM | POA: Insufficient documentation

## 2016-03-05 DIAGNOSIS — K859 Acute pancreatitis without necrosis or infection, unspecified: Secondary | ICD-10-CM | POA: Insufficient documentation

## 2016-03-05 DIAGNOSIS — E781 Pure hyperglyceridemia: Secondary | ICD-10-CM | POA: Diagnosis not present

## 2016-03-05 DIAGNOSIS — I5042 Chronic combined systolic (congestive) and diastolic (congestive) heart failure: Secondary | ICD-10-CM | POA: Diagnosis not present

## 2016-03-05 DIAGNOSIS — G8929 Other chronic pain: Secondary | ICD-10-CM | POA: Insufficient documentation

## 2016-03-05 LAB — BASIC METABOLIC PANEL
Anion gap: 5 (ref 5–15)
BUN: 12 mg/dL (ref 6–20)
CALCIUM: 10.2 mg/dL (ref 8.9–10.3)
CHLORIDE: 109 mmol/L (ref 101–111)
CO2: 24 mmol/L (ref 22–32)
CREATININE: 1.08 mg/dL (ref 0.61–1.24)
GFR calc non Af Amer: >60 mL/min (ref >60)
Glucose, Bld: 107 mg/dL — ABNORMAL HIGH (ref 65–99)
Potassium: 5 mmol/L (ref 3.5–5.1)
SODIUM: 138 mmol/L (ref 135–145)

## 2016-03-05 NOTE — Telephone Encounter (Signed)
-----   Message from Shirley Friar, PA-C sent at 03/05/2016 11:53 AM EST ----- Stop Potassium with increase in Entresto.   Repeat labs next week or early the following as ordered.   May need [place] .   Legrand Como [address]" [place], PA-C 03/05/2016 11:51 AM

## 2016-03-05 NOTE — Progress Notes (Signed)
Advanced Heart Failure Medication Review by a Pharmacist  Does the patient  feel that his/her medications are working for him/her?  yes  Has the patient been experiencing any side effects to the medications prescribed?  no  Does the patient measure his/her own blood pressure or blood glucose at home?  yes   Does the patient have any problems obtaining medications due to transportation or finances?   no  Understanding of regimen: good Understanding of indications: good Potential of compliance: good Patient understands to avoid NSAIDs. Patient understands to avoid decongestants.  Issues to address at subsequent visits: None   Pharmacist comments: [name] [name] is a pleasant 61 yo M presenting with his medication bottles except for his torsemide. He reports good compliance with his regimen and did not have any specific medication-related questions or concerns for me at this time.   Ruta Hinds. Velva Harman, PharmD, BCPS, CPP Clinical Pharmacist Pager: [phone] Phone: [phone] 03/05/2016 10:43 AM      Time with patient: 10 minutes Preparation and documentation time: 2 minutes Total time: 12 minutes

## 2016-03-05 NOTE — Telephone Encounter (Signed)
Patient aware. Repeat labs 2/22

## 2016-03-05 NOTE — Patient Instructions (Signed)
INCREASE Entresto to 97/103 mg tablet twice daily.  Routine lab work today. Will notify you of abnormal results, otherwise no news is good news!  Return in 2 weeks for lab work.  Follow up 6 weeks with [name] Kilts PA-C.  Do the following things EVERYDAY: 1) Weigh yourself in the morning before breakfast. Write it down and keep it in a log. 2) Take your medicines as prescribed 3) Eat low salt foods-Limit salt (sodium) to 2000 mg per day.  4) Stay as active as you can everyday 5) Limit all fluids for the day to less than 2 liters

## 2016-03-05 NOTE — Progress Notes (Signed)
Patient ID: [name] [name], male   DOB: 1956/01/17, 61 y.o.   MRN: [id number]    Advanced Heart Failure Clinic Note   Primary Care: Internal Medicine clinic, Dr. Silverio [name] Primary Cardiologist: Dr. Doylene [name]  GI: Dr [name] [name].  Primary HF: Dr. Haroldine [name]   HPI: [name] [name] is a 61 y.o. 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 Had history of acute renal failure in setting of rhabdo requiring short term HD.    Admitted with new onset HF in April 2014.   He was diuresed and discharged home.  Discharge weight 256 pounds. Echo at the time showed EF 71-06%, grade 1 diastolic dysfunction.    Admitted to [facility] 5/30 through 06/27/12 with GI bleed. EGD 06/25/12 multiple ulcers noted. Continued on protonix.   Admitted 8/2 through 09/03/15 with marked volume overload in the setting of medication compliance. Diuresed with IV lasix and transitioned to torsemide 60 mg daily. Hospital course complicated by elevated renal function.Clonidine was stopped on discharge and carvedilol was cut back to 6.25 mg twice a day. Discharge weight 272 pounds.    He was admitted 11/21-11/24 for Hyperosmolar hyperglycemic state after he had stopped taking all of his diabetes medications.   He presents today for regular follow up. Weight at home ~ 279. Up 5 lbs since last visit.  Sugars have been well controlled.  Breathing has been "off and on". Can walk a couple of blocks on a "good day" and only about 1 block on a bad day.  Occasional bendopnea and orthopnea. Denies peripheral edema. Trying to watch salt and fluids. Trying to start exercising daily, starting with walking.   Echo 12/26/15 EF 30-35% RV ok.   ECHO 10/27/12 EF 40-45%  ECHO 06/04/15 LVEF 20-25%, decreased diastolic compliance, LAE, RV severely reduced.    ECHO 11/26/15 EF 30%   RHC 08/2015 RA = 7 RV = 35/10 PA = 37/15 (25) PCW = 10 Fick cardiac output/index = 4.3/1.7 PVR = 3.5 WU Ao sat = 95% PA sat = 58%, 58% Assessment: 1. Volume status looks good. 2. Cardiac output depressed  2014 LHC -  Left main: Normal LAD: Large vessel gives of 2 diagonals. Normal LCX: Very large vessel with 3 OMs. 20-30% plaque in midsection.  RCA: Dominant. Normal LV-gram done in the RAO projection: Ejection fraction = 35-40% global HK Assessment: 1. Minimal CAD  Labs 02/12/13 K 4.3 Creatinine 0.98          08/2013: K 3.9, creatinine 1.27, Mag 1.8, pro-BNP 392, Troponin 0.05          06/07/15: K 4.2, creatinine 1.21          07/10/2015: K 3.9 Creatinine 1.16           09/03/2015: K 4.7 Creatinine 1.32          09/11/2015: K 4.1 Creatinine 1.29   SH: Quit smoking 9 years ago. Lives alone. Quit ETOH 09/25/2013 FH: Mom died from MI in her early 59s  Brother MI and deceased. Does not know about fathers health history.    Review of Systems: All pertinent positives and negatives as in HPI, otherwise negative.    Past Medical History:  Diagnosis Date  . Atrial fibrillation ([place])   . Cancer [facility])    Prostate cancer-bx. 3 weeks ago  . Cataract   .  Chronic combined systolic and diastolic CHF (congestive heart failure) (HCC)    a) EF 40-45% per 2D echo (02/2012) with grade 1 DD b)  NICM c) RHC (04/2012): RA: 4, RV 45/3/4, PA 42/9 (24), PCWP 14, Fick CO/CI: 5.2 /2.2, PVR 1.9 WU, PA 62% and 64% d) ECHO (10/2012) EF 40-45%, grade II DD, RV nl  . CKD (chronic kidney disease) stage 2, GFR 60-89 ml/min    BL SCr approximately 1-1.3  . Colitis 05/2009   History of colitis of ascending colon noted on CT abd/pelvis (05/2009), with interval resolution with subsequent CT  . Continuous chronic alcoholism ([place])   . Degenerative lumbar spinal stenosis    s/p L2-3, L3-4, L4-5 laminectomy partial facetectomy, and bilateral foraminotomy  . Diabetes mellitus without  complication ([place])    Type II  . Dysrhythmia    A. Fib  . Family history of adverse reaction to anesthesia    "sister, can't go to sleep"  . GERD (gastroesophageal reflux disease)   . H/O cocaine abuse   . Hepatic steatosis    suspected 2/2 alcohol abuse  . Hepatitis C   . History of pancreatitis 01/2011   Admission for acute pancreatitis presumed 2/2 ongoing alcohol abuse- and hypertriglyceridemia  . History of pneumonia   . HIT (heparin-induced thrombocytopenia) ([place])   . Hyperlipidemia   . Hypertension    uncontrolled with medication noncompliance  . Hypertriglyceridemia   . NICM (nonischemic cardiomyopathy) ([place])    a. LHC (04/2012): nl arteries  . PFO (patent foramen ovale) 01/2012   with right to left shunt, noted per TEE in evaluation for source of embolic stroke in 03/2200  . Rhabdomyolysis 02/22/2012   H/O rhabdomyolysis in 01/2012 that was idiopathic, cause never identified  . Schizophrenia, schizo-affective ([place])   . Shortness of breath dyspnea   . Splenic cyst   . Stroke ([place]) 01/2012   Small cerebellar infarcts right greater than left as well as questionable acute left external capsule and caudate nuclear punctate lacunar infarcts noted per MRI (01/2012) - presumed to be embolic likely source PFO with right to left shunt (noted per TEE 01/ 2014)    Current Outpatient Prescriptions  Medication Sig Dispense Refill  . aspirin EC 81 MG tablet Take 81 mg by mouth daily.    . Blood Glucose Monitoring Suppl (ONE TOUCH ULTRA MINI) w/Device KIT 1 each by Does not apply route as needed. 1 each 0  . carvedilol (COREG) 6.25 MG tablet Take 1 tablet (6.25 mg total) by mouth 2 (two) times daily with a meal. 60 tablet 3  . glucose blood (ONE TOUCH ULTRA TEST) test strip Use to check blood sugars 4 times daily 150 each 5  . HYDROcodone-acetaminophen (NORCO) 10-325 MG tablet Take 1 tablet by mouth every 8 (eight) hours as needed for pain.    . Insulin Glargine (LANTUS SOLOSTAR) 100  UNIT/ML Solostar Pen Inject 50 Units into the skin daily at 10 pm.    . insulin lispro (HUMALOG) 100 UNIT/ML KiwkPen Inject 0.15 mLs (15 Units total) into the skin 3 (three) times daily before meals. 15 mL 11  . Insulin Pen Needle (CAREFINE PEN NEEDLES) 32G X 5 MM MISC 1 each by Does not apply route as needed. 100 each 11  . metFORMIN (GLUCOPHAGE) 1000 MG tablet Take 1 tablet (1,000 mg total) by mouth 2 (two) times daily with a meal. 60 tablet 11  . ONETOUCH DELICA LANCETS 54Y MISC Check blood sugar before meals and bedtime up  to 4 times a day 200 each 5  . Potassium Chloride ER 20 MEQ TBCR Take 20 mEq by mouth daily. 30 tablet 6  . QUEtiapine (SEROQUEL) 400 MG tablet Take 400 mg by mouth at bedtime.     . rivaroxaban (XARELTO) 20 MG TABS tablet Take 1 tablet (20 mg total) by mouth daily. 90 tablet 3  . rosuvastatin (CRESTOR) 20 MG tablet Take 20 mg by mouth daily.    . sacubitril-valsartan (ENTRESTO) 49-51 MG Take 1 tablet by mouth 2 (two) times daily.    . sitaGLIPtin (JANUVIA) 100 MG tablet Take 1 tablet (100 mg total) by mouth daily. 30 tablet 4  . spironolactone (ALDACTONE) 25 MG tablet Take 1 tablet (25 mg total) by mouth daily. 90 tablet 3  . torsemide (DEMADEX) 20 MG tablet Take 60 mg by mouth daily.     No current facility-administered medications for this encounter.     Allergies  Allergen Reactions  . Heparin Other (See Comments)    Documented HIT under problem list Pt states he's not allergic. " They told me not take it anymore"  . Thorazine [Chlorpromazine Hcl] Other (See Comments)    Body freezes up     Vitals:   03/05/16 1041  BP: 135/84  BP Location: Right Arm  Patient Position: Sitting  Cuff Size: Normal  Pulse: 95  Temp: 98 F (36.7 C)  Weight: 281 lb 6.4 oz (127.6 kg)   Wt Readings from Last 3 Encounters:  03/05/16 281 lb 6.4 oz (127.6 kg)  02/25/16 284 lb 6.4 oz (129 kg)  01/06/16 275 lb (124.7 kg)     PHYSICAL EXAM: General: Well appearing. NAD.      HEENT: Normal.  Neck: supple. JVP 7-8 cm. Carotids 2+ bilat; no bruits. No thryomegaly or nodule noted.  Cor: PMI nondisplaced. RRR. No rubs, gallops, soft TR murmur. Lungs: CTAB, normal effort.  Abdomen: Obese, soft, NT, ND, no HSM. No bruits or masses. +BS  Extremities: no cyanosis, clubbing, rash, BLE trace edema.   Neuro: alert & oriented x 3, cranial nerves grossly intact. moves all 4 extremities w/o difficulty. Affect flat.    ASSESSMENT /PLAN 1) Chronic combined systolic/diastolic HF: EF 95-62%, grade II DD (10/2012) now decreased to 20-25%  (05/2015) RV severely reduced. Echo EF 30-35% RV ok. Had [place] 2014 with minimal CAD.  -NYHA II.  Volume status looks OK on exam.  - Continue torsemide 60 mg daily.  - Continue spironolactone 25 mg daily.  - Continue carvedilol 6.25 mg BID - Increase Entresto to 97-103 mg BID. He did not increase after last visit. BMET today and repeat 10-14 days with increase in [place].  - Would not pursue ICD at this as EF appears to be improving and medication compliance not optimal. Will repeat echo in 3-4 months and if EF < 35% consider ICD. (He would be ok with proceeding if needed) - Reinforced fluid restriction to < 2 L daily, sodium restriction to less than 2000 mg daily, and the importance of daily weights.    2) HTN - Meds as above.   3) PAF:  - NSR by exam today.   - Continue Xarelto 20 mg daily.  4) ETOH abuse-  - Remains abstinent.  5) CKD stage II - BMET today and repeat 10-14 days with Entresto change.  6) ?OSA - Missed appt for sleep study and says he doesn't want to reschedule  - Refuses.  7) DMII - Per PCP. Has been stable per  patient.  8) Medication Noncompliance - Seems to be doing better.   Meds and labs as above. Follow up 6 weeks. Sooner with symptoms.   Shirley Friar, PA-C  10:46 AM  Total time spent > 25 minutes. Over half that spent discussing the above.

## 2016-03-06 DIAGNOSIS — G894 Chronic pain syndrome: Secondary | ICD-10-CM | POA: Diagnosis not present

## 2016-03-06 DIAGNOSIS — M545 Low back pain: Secondary | ICD-10-CM | POA: Diagnosis not present

## 2016-03-19 ENCOUNTER — Ambulatory Visit (HOSPITAL_COMMUNITY)
Admission: RE | Admit: 2016-03-19 | Discharge: 2016-03-19 | Disposition: A | Discharge status: Home / Self Care | Payer: Medicare Other | Source: Ambulatory Visit | Attending: Internal Medicine | Admitting: Internal Medicine

## 2016-03-19 DIAGNOSIS — G8929 Other chronic pain: Secondary | ICD-10-CM

## 2016-03-19 DIAGNOSIS — M549 Dorsalgia, unspecified: Secondary | ICD-10-CM | POA: Insufficient documentation

## 2016-03-19 LAB — BASIC METABOLIC PANEL
Anion gap: 8 (ref 5–15)
BUN: 14 mg/dL (ref 6–20)
CALCIUM: 10.2 mg/dL (ref 8.9–10.3)
CO2: 24 mmol/L (ref 22–32)
CREATININE: 0.93 mg/dL (ref 0.61–1.24)
Chloride: 108 mmol/L (ref 101–111)
GFR calc non Af Amer: >60 mL/min (ref >60)
Glucose, Bld: 106 mg/dL — ABNORMAL HIGH (ref 65–99)
Potassium: 4.5 mmol/L (ref 3.5–5.1)
SODIUM: 140 mmol/L (ref 135–145)

## 2016-03-23 ENCOUNTER — Ambulatory Visit: Payer: Medicaid Other | Admitting: Neurology

## 2016-03-23 ENCOUNTER — Telehealth: Payer: Self-pay | Admitting: Neurology

## 2016-03-23 NOTE — Telephone Encounter (Signed)
The patient no showed for new patient appointment today, this is the second no-show.  The patient will be discharged from our practice.

## 2016-03-25 ENCOUNTER — Encounter (HOSPITAL_COMMUNITY): Payer: Self-pay | Admitting: Internal Medicine

## 2016-03-31 ENCOUNTER — Encounter: Payer: Self-pay | Admitting: Neurology

## 2016-03-31 NOTE — Addendum Note (Signed)
Addended by: Hulan Fray on: 03/31/2016 07:46 AM   Modules accepted: Orders

## 2016-04-02 DIAGNOSIS — M545 Low back pain: Secondary | ICD-10-CM | POA: Diagnosis not present

## 2016-04-02 DIAGNOSIS — G894 Chronic pain syndrome: Secondary | ICD-10-CM | POA: Diagnosis not present

## 2016-04-07 ENCOUNTER — Ambulatory Visit: Payer: Self-pay | Admitting: Dietician

## 2016-04-16 ENCOUNTER — Inpatient Hospital Stay (HOSPITAL_COMMUNITY): Admission: RE | Admit: 2016-04-16 | Payer: Self-pay | Source: Ambulatory Visit

## 2016-04-26 ENCOUNTER — Other Ambulatory Visit (HOSPITAL_COMMUNITY): Payer: Self-pay | Admitting: Internal Medicine

## 2016-05-01 DIAGNOSIS — M545 Low back pain: Secondary | ICD-10-CM | POA: Diagnosis not present

## 2016-05-01 DIAGNOSIS — G894 Chronic pain syndrome: Secondary | ICD-10-CM | POA: Diagnosis not present

## 2016-05-26 ENCOUNTER — Encounter (INDEPENDENT_AMBULATORY_CARE_PROVIDER_SITE_OTHER): Payer: Self-pay

## 2016-05-26 ENCOUNTER — Ambulatory Visit: Payer: Self-pay | Admitting: Dietician

## 2016-05-26 ENCOUNTER — Ambulatory Visit (INDEPENDENT_AMBULATORY_CARE_PROVIDER_SITE_OTHER): Payer: Medicare Other | Admitting: Internal Medicine

## 2016-05-26 ENCOUNTER — Encounter: Payer: Self-pay | Admitting: Internal Medicine

## 2016-05-26 VITALS — BP 101/55 | HR 100 | Temp 98.3°F | Ht 71.0 in | Wt 289.9 lb

## 2016-05-26 DIAGNOSIS — I5022 Chronic systolic (congestive) heart failure: Secondary | ICD-10-CM

## 2016-05-26 DIAGNOSIS — G8929 Other chronic pain: Secondary | ICD-10-CM

## 2016-05-26 DIAGNOSIS — G5603 Carpal tunnel syndrome, bilateral upper limbs: Secondary | ICD-10-CM

## 2016-05-26 DIAGNOSIS — I480 Paroxysmal atrial fibrillation: Secondary | ICD-10-CM | POA: Diagnosis not present

## 2016-05-26 DIAGNOSIS — F209 Schizophrenia, unspecified: Secondary | ICD-10-CM | POA: Diagnosis not present

## 2016-05-26 DIAGNOSIS — I110 Hypertensive heart disease with heart failure: Secondary | ICD-10-CM | POA: Diagnosis not present

## 2016-05-26 DIAGNOSIS — Z7901 Long term (current) use of anticoagulants: Secondary | ICD-10-CM

## 2016-05-26 DIAGNOSIS — I10 Essential (primary) hypertension: Secondary | ICD-10-CM | POA: Diagnosis not present

## 2016-05-26 DIAGNOSIS — Z7982 Long term (current) use of aspirin: Secondary | ICD-10-CM | POA: Diagnosis not present

## 2016-05-26 DIAGNOSIS — E1136 Type 2 diabetes mellitus with diabetic cataract: Secondary | ICD-10-CM

## 2016-05-26 DIAGNOSIS — M549 Dorsalgia, unspecified: Secondary | ICD-10-CM | POA: Diagnosis not present

## 2016-05-26 DIAGNOSIS — Z79891 Long term (current) use of opiate analgesic: Secondary | ICD-10-CM | POA: Diagnosis not present

## 2016-05-26 DIAGNOSIS — Z794 Long term (current) use of insulin: Secondary | ICD-10-CM | POA: Diagnosis not present

## 2016-05-26 DIAGNOSIS — I5020 Unspecified systolic (congestive) heart failure: Secondary | ICD-10-CM

## 2016-05-26 DIAGNOSIS — Z87891 Personal history of nicotine dependence: Secondary | ICD-10-CM

## 2016-05-26 DIAGNOSIS — E119 Type 2 diabetes mellitus without complications: Secondary | ICD-10-CM | POA: Diagnosis not present

## 2016-05-26 LAB — GLUCOSE, CAPILLARY: Glucose-Capillary: 155 mg/dL — ABNORMAL HIGH (ref 65–99)

## 2016-05-26 LAB — POCT GLYCOSYLATED HEMOGLOBIN (HGB A1C): Hemoglobin A1C: 6.4

## 2016-05-26 MED ORDER — SACUBITRIL-VALSARTAN 97-103 MG PO TABS: 1.0000 | ORAL_TABLET | Freq: Two times a day (BID) | ORAL | 1 refills | Status: DC

## 2016-05-26 MED ORDER — ROSUVASTATIN CALCIUM 20 MG PO TABS: 20.0000 mg | ORAL_TABLET | Freq: Every day | ORAL | 1 refills | Status: DC

## 2016-05-26 NOTE — Patient Instructions (Addendum)
-   It was a pleasure seeing you today - Please follow up with the heart doctor and the neurologist - Your diabetes is well controlled. Keep up the great work! - We will check your potassium levels today - Your BP is well controlled. Keep up the great work!

## 2016-05-26 NOTE — Assessment & Plan Note (Signed)
-   Patient states that his tingling and numbness has improved after using the splint at night - He did not buy another splint as advised and alternates using the splint between both hands - He states he will follow up with neurology for possible nerve conduction studies - No further workup for now. Patient advised to use splints for both hands at night

## 2016-05-26 NOTE — Assessment & Plan Note (Signed)
-   Patient states his symptoms are well-controlled on Seroquel - He follows up with psychiatrist as an outpatient and will continue to do so - We'll continue to monitor

## 2016-05-26 NOTE — Progress Notes (Signed)
   Subjective:    Patient ID: [name] [name], male    DOB: 25-Sep-1955, 61 y.o.   MRN: [id number]  HPI  I have seen and examined this patient. [name] is here for routine follow-up of his hypertension and diabetes. He states he feels well today and has no new complaints.  He has missed his last follow-up with his cardiologist for his chronic systolic heart failure and also reports not having scheduled an appointment with his neurologist for his carpal tunnel syndrome. He states that he is working this month to schedule all his follow-up appointments.    Review of Systems  Constitutional: Negative.   HENT: Negative.   Respiratory: Negative.  Negative for chest tightness, shortness of breath and wheezing.   Cardiovascular: Negative.  Negative for chest pain and leg swelling.  Gastrointestinal: Negative.   Musculoskeletal: Negative.  Negative for back pain.       States his back pain is now well controlled and he denies any pain at present  Neurological: Negative.   Psychiatric/Behavioral: Negative.        Objective:   Physical Exam  Constitutional: He is oriented to person, place, and time. He appears well-developed and well-nourished.  HENT:  Head: Normocephalic and atraumatic.  Mouth/Throat: No oropharyngeal exudate.  Neck: Neck supple.  Cardiovascular: Normal rate, regular rhythm and normal heart sounds.   Pulmonary/Chest: Effort normal and breath sounds normal. No respiratory distress. He has no wheezes.  Abdominal: Soft. Bowel sounds are normal. He exhibits no distension. There is no tenderness.  Musculoskeletal: Normal range of motion. He exhibits no edema.  Lymphadenopathy:    He has no cervical adenopathy.  Neurological: He is alert and oriented to person, place, and time.  Skin: Skin is warm. No rash noted. No erythema.  Dry skin noted over both lower extremities  Psychiatric: He has a normal mood and affect. His behavior is normal.          Assessment &  Plan:  Please see problem -based charting for assessment and plan

## 2016-05-26 NOTE — Assessment & Plan Note (Addendum)
-   Patient now follows up with Dr. Mirna Mires for his pain management - He is currently on Norco 10/325 mg every 8 hours as needed - He states his pain is much better controlled and he denies any pain currently - We do not prescribe pain medications for him at our clinic

## 2016-05-26 NOTE — Assessment & Plan Note (Addendum)
Lab Results  Component Value Date   HGBA1C 6.4 05/26/2016   HGBA1C 11.8 12/30/2015   HGBA1C 6.1 (H) 08/28/2015     Assessment: Diabetes control:  well-controlled Progress toward A1C goal:   at goal Comments: Patient has been taking 20 units of Humalog pre-meal instead of the 15 that was recommended. He is taking Lantus 50 units at night as prescribed. Patient's A1c is only 6.1 and I'm concerned that he is having hypoglycemic episodes with the higher doses of Humalog even though patient denies this.  Plan: Medications:  Advised patient to decrease his pre-meal Humalog to 15 units as prescribed and continue with his Lantus 50 units Home glucose monitoring: Frequency:   Timing:   Instruction/counseling given: reminded to get eye exam Educational resources provided: brochure (denies need ) Self management tools provided:   Other plans: We'll check BMP today.

## 2016-05-26 NOTE — Assessment & Plan Note (Signed)
BP Readings from Last 3 Encounters:  05/26/16 (!) 101/55  03/05/16 135/84  02/25/16 (!) 142/99    Lab Results  Component Value Date   NA 140 03/19/2016   K 4.5 03/19/2016   CREATININE 0.93 03/19/2016    Assessment: Blood pressure control:  well-controlled Progress toward BP goal:   at goal Comments: Patient is compliant with his carvedilol, torsemide and entresto  Plan: Medications:  continue current medications Educational resources provided: brochure (denies need ) Self management tools provided:   Other plans: We'll check BMP today

## 2016-05-26 NOTE — Assessment & Plan Note (Signed)
-   Patient's heart rate is regular on exam today and he denies any recent episodes of palpitations - We will continue with this carvedilol and rivaroxaban - No further workup at this time. Patient to follow-up with cardiology

## 2016-05-26 NOTE — Assessment & Plan Note (Signed)
-   Patient states that his shortness of breath has resolved and he has no chest pain or lower extremity swelling on his current medication regimen - He missed his last appointment with his cardiologist but is working to set this up again for the coming month - We will continue his Entresto at the higher dose of 97-103 milligrams as recommended by his cardiologist for his chronic systolic heart failure - We will continue with carvedilol 6.25 mg, aspirin 81 mg and daily torsemide 20 mg

## 2016-05-27 ENCOUNTER — Telehealth: Payer: Self-pay | Admitting: Internal Medicine

## 2016-05-27 LAB — BMP8+ANION GAP
Anion Gap: 19 mmol/L — ABNORMAL HIGH (ref 10.0–18.0)
BUN / CREAT RATIO: 16 (ref 10–24)
BUN: 24 mg/dL (ref 8–27)
CALCIUM: 10.6 mg/dL — AB (ref 8.6–10.2)
CHLORIDE: 97 mmol/L (ref 96–106)
CO2: 25 mmol/L (ref 18–29)
Creatinine, Ser: 1.46 mg/dL — ABNORMAL HIGH (ref 0.76–1.27)
GFR calc Af Amer: 59 mL/min/{1.73_m2} — ABNORMAL LOW (ref >59)
GFR calc non Af Amer: 51 mL/min/{1.73_m2} — ABNORMAL LOW (ref >59)
GLUCOSE: 130 mg/dL — AB (ref 65–99)
POTASSIUM: 4.5 mmol/L (ref 3.5–5.2)
Sodium: 141 mmol/L (ref 134–144)

## 2016-05-27 NOTE — Telephone Encounter (Signed)
I called patient to discuss results of bloodwork. He was noted to have an elevated creatinine to 1.4. I think this may be secondary to his torsemide and valsartan/sacubitril. I am not inclined to hold these medications at this point given his past history of decompensated HF. Will continue with these medications for now and have him follow up in 1 week in [facility] for repeat bloodwork and to assess for any mew urinary symptoms (he denies any currently). Patient expresses understanding and is in agreement with plan.

## 2016-05-29 DIAGNOSIS — G894 Chronic pain syndrome: Secondary | ICD-10-CM | POA: Diagnosis not present

## 2016-05-29 DIAGNOSIS — M545 Low back pain: Secondary | ICD-10-CM | POA: Diagnosis not present

## 2016-06-08 ENCOUNTER — Ambulatory Visit: Payer: Self-pay

## 2016-06-16 ENCOUNTER — Telehealth: Payer: Self-pay | Admitting: Internal Medicine

## 2016-06-16 NOTE — Telephone Encounter (Signed)
   Reason for call:   I received a call from [name] at 6:45 PM indicating that his BG was 400s.   Pertinent Data:   Pt Reported that he checked his blood sugars this evening and noted it to be in the 400s. This was prior to him taking his regular Humalog insulin of 15 units. Patient took his 15 units of insulin and checked his blood glucose 1 hour later it was in the 300s. Patient denies any symptoms of nausea, vomiting, abdominal pain, anxiety, blurry vision, polyuria, polydipsia.   Patient reports that he wants to know whether he should eat dinner given his elevated blood glucose and whether he should make any other changes to his insulin dosing.   Patient typically administers 50 units of Lantus every night before bed.    Assessment / Plan / Recommendations:   I discussed with patient signs and symptoms of hyperglycemia. I recommended that the patient recheck his blood sugar in 2-3 more hours prior to his evening Lantus. I recommended that if the patient's blood glucose was still elevated greater than 300 that he should call back as we may have further advice on his insulin administration. If his blood glucoses less than 300 patient can continue with his normal 50 units of Lantus before bed.  As always, pt is advised that if symptoms worsen or new symptoms arise, they should go to an urgent care facility or to to ER for further evaluation.   Holley Raring, MD   06/16/2016, 7:05 PM

## 2016-06-23 ENCOUNTER — Ambulatory Visit (INDEPENDENT_AMBULATORY_CARE_PROVIDER_SITE_OTHER): Payer: Medicare Other | Admitting: Internal Medicine

## 2016-06-23 VITALS — BP 133/96 | HR 86 | Temp 97.8°F | Ht 72.0 in | Wt 297.0 lb

## 2016-06-23 DIAGNOSIS — N182 Chronic kidney disease, stage 2 (mild): Secondary | ICD-10-CM | POA: Diagnosis not present

## 2016-06-23 NOTE — Progress Notes (Signed)
CC: f/up for increased crt  HPI:  [name] [name] is a 61 y.o. with pmh as listed below is here because he was asked to come in due to rise in crt on his last lab check.   4 weeks ago his crt bumped to 1.46 from 0.93 prior to that. Dr. Dareen Piano thought that this could be due to torsemide and valsartan/sacubitril. He did not stop these meds but asked him to come back for repeat blood work. Doing well today, no urinary symptoms, no NSAID use, no OTC or supplements, breathing fine, no chest pain, or any other symptoms.    Past Medical History:  Diagnosis Date  . Atrial fibrillation ([place])   . Cancer [facility])    Prostate cancer-bx. 3 weeks ago  . Cataract   . Chronic combined systolic and diastolic CHF (congestive heart failure) (HCC)    a) EF 40-45% per 2D echo (02/2012) with grade 1 DD b)  NICM c) RHC (04/2012): RA: 4, RV 45/3/4, PA 42/9 (24), PCWP 14, Fick CO/CI: 5.2 /2.2, PVR 1.9 WU, PA 62% and 64% d) ECHO (10/2012) EF 40-45%, grade II DD, RV nl  . CKD (chronic kidney disease) stage 2, GFR 60-89 ml/min    BL SCr approximately 1-1.3  . Colitis 05/2009   History of colitis of ascending colon noted on CT abd/pelvis (05/2009), with interval resolution with subsequent CT  . Continuous chronic alcoholism ([place])   . Degenerative lumbar spinal stenosis    s/p L2-3, L3-4, L4-5 laminectomy partial facetectomy, and bilateral foraminotomy  . Diabetes mellitus without complication ([place])    Type II  . Dysrhythmia    A. Fib  . Family history of adverse reaction to anesthesia    "sister, can't go to sleep"  . GERD (gastroesophageal reflux disease)   . H/O cocaine abuse   . Hepatic steatosis    suspected 2/2 alcohol abuse  . Hepatitis C   . History of pancreatitis 01/2011   Admission for acute pancreatitis presumed 2/2 ongoing alcohol abuse- and hypertriglyceridemia  . History of pneumonia   . HIT (heparin-induced thrombocytopenia) ([place])   . Hyperlipidemia   . Hypertension    uncontrolled  with medication noncompliance  . Hypertriglyceridemia   . NICM (nonischemic cardiomyopathy) ([place])    a. LHC (04/2012): nl arteries  . PFO (patent foramen ovale) 01/2012   with right to left shunt, noted per TEE in evaluation for source of embolic stroke in 07/348  . Rhabdomyolysis 02/22/2012   H/O rhabdomyolysis in 01/2012 that was idiopathic, cause never identified  . Schizophrenia, schizo-affective ([place])   . Shortness of breath dyspnea   . Splenic cyst   . Stroke ([place]) 01/2012   Small cerebellar infarcts right greater than left as well as questionable acute left external capsule and caudate nuclear punctate lacunar infarcts noted per MRI (01/2012) - presumed to be embolic likely source PFO with right to left shunt (noted per TEE 01/ 2014)    Review of Systems:  Review of Systems  Constitutional: Negative for chills and fever.  Cardiovascular: Negative for chest pain and palpitations.  Gastrointestinal: Negative for abdominal pain, diarrhea, heartburn, nausea and vomiting.  Genitourinary: Negative for dysuria, flank pain, frequency, hematuria and urgency.  Neurological: Negative for dizziness and headaches.     Physical Exam:  Vitals:   06/23/16 1332  BP: (!) 133/96  Pulse: 86  Temp: 97.8 F (36.6 C)  TempSrc: Oral  SpO2: 96%  Weight: 297 lb (134.7 kg)  Height: 6' (  1.829 m)   Physical Exam  Constitutional: He is oriented to person, place, and time. He appears well-developed and well-nourished. No distress.  HENT:  Head: Normocephalic and atraumatic.  Eyes: Conjunctivae are normal.  Cardiovascular: Normal rate and regular rhythm.  Exam reveals no gallop and no friction rub.   No murmur heard. Respiratory: Effort normal and breath sounds normal. No respiratory distress. He has no wheezes.  Musculoskeletal: Normal range of motion. He exhibits no edema.  Dry flaky skin on both legs.   Neurological: He is alert and oriented to person, place, and time.  Skin: He is not  diaphoretic.    Assessment & Plan:   See Encounters Tab for problem based charting.  Patient discussed with Dr. Evette Doffing

## 2016-06-23 NOTE — Patient Instructions (Addendum)
Will recheck your labwork today.  Continue your current medications.  Follow up with Dr. Dareen Piano in 2 months.

## 2016-06-23 NOTE — Progress Notes (Signed)
Internal Medicine Clinic Attending  Case discussed with Dr. Ahmed at the time of the visit.  We reviewed the resident's history and exam and pertinent patient test results.  I agree with the assessment, diagnosis, and plan of care documented in the resident's note. 

## 2016-06-23 NOTE — Assessment & Plan Note (Signed)
4 weeks ago his crt bumped to 1.46 from 0.93 prior to that. His PCP thought it could be from torsemide and valsartan/sacubitril that he was on, did not make any changes with meds. Not taking any nephrotoxins otherwise. No symptoms.  Will recheck BMET today. Continue current meds.

## 2016-06-24 LAB — BASIC METABOLIC PANEL
BUN / CREAT RATIO: 18 (ref 10–24)
BUN: 17 mg/dL (ref 8–27)
CHLORIDE: 103 mmol/L (ref 96–106)
CO2: 22 mmol/L (ref 18–29)
Calcium: 10.3 mg/dL — ABNORMAL HIGH (ref 8.6–10.2)
Creatinine, Ser: 0.97 mg/dL (ref 0.76–1.27)
GFR calc Af Amer: 96 mL/min/{1.73_m2} (ref >59)
GFR calc non Af Amer: 83 mL/min/{1.73_m2} (ref >59)
GLUCOSE: 105 mg/dL — AB (ref 65–99)
Potassium: 4.5 mmol/L (ref 3.5–5.2)
SODIUM: 140 mmol/L (ref 134–144)

## 2016-06-25 DIAGNOSIS — G894 Chronic pain syndrome: Secondary | ICD-10-CM | POA: Diagnosis not present

## 2016-06-25 DIAGNOSIS — M545 Low back pain: Secondary | ICD-10-CM | POA: Diagnosis not present

## 2016-07-06 ENCOUNTER — Ambulatory Visit (HOSPITAL_COMMUNITY)
Admission: RE | Admit: 2016-07-06 | Discharge: 2016-07-06 | Disposition: A | Discharge status: Home / Self Care | Payer: Medicare Other | Source: Ambulatory Visit | Attending: Internal Medicine | Admitting: Internal Medicine

## 2016-07-06 ENCOUNTER — Encounter (HOSPITAL_COMMUNITY): Payer: Self-pay

## 2016-07-06 VITALS — BP 103/74 | Wt 297.4 lb

## 2016-07-06 DIAGNOSIS — F1410 Cocaine abuse, uncomplicated: Secondary | ICD-10-CM | POA: Insufficient documentation

## 2016-07-06 DIAGNOSIS — N182 Chronic kidney disease, stage 2 (mild): Secondary | ICD-10-CM

## 2016-07-06 DIAGNOSIS — Z7901 Long term (current) use of anticoagulants: Secondary | ICD-10-CM | POA: Diagnosis not present

## 2016-07-06 DIAGNOSIS — R5383 Other fatigue: Secondary | ICD-10-CM

## 2016-07-06 DIAGNOSIS — Z9114 Patient's other noncompliance with medication regimen: Secondary | ICD-10-CM | POA: Diagnosis not present

## 2016-07-06 DIAGNOSIS — Z79899 Other long term (current) drug therapy: Secondary | ICD-10-CM | POA: Insufficient documentation

## 2016-07-06 DIAGNOSIS — K219 Gastro-esophageal reflux disease without esophagitis: Secondary | ICD-10-CM | POA: Diagnosis not present

## 2016-07-06 DIAGNOSIS — I440 Atrioventricular block, first degree: Secondary | ICD-10-CM | POA: Insufficient documentation

## 2016-07-06 DIAGNOSIS — I5042 Chronic combined systolic (congestive) and diastolic (congestive) heart failure: Secondary | ICD-10-CM | POA: Insufficient documentation

## 2016-07-06 DIAGNOSIS — N179 Acute kidney failure, unspecified: Secondary | ICD-10-CM | POA: Insufficient documentation

## 2016-07-06 DIAGNOSIS — F209 Schizophrenia, unspecified: Secondary | ICD-10-CM | POA: Diagnosis not present

## 2016-07-06 DIAGNOSIS — G4733 Obstructive sleep apnea (adult) (pediatric): Secondary | ICD-10-CM | POA: Insufficient documentation

## 2016-07-06 DIAGNOSIS — K859 Acute pancreatitis without necrosis or infection, unspecified: Secondary | ICD-10-CM | POA: Insufficient documentation

## 2016-07-06 DIAGNOSIS — E781 Pure hyperglyceridemia: Secondary | ICD-10-CM | POA: Diagnosis not present

## 2016-07-06 DIAGNOSIS — I480 Paroxysmal atrial fibrillation: Secondary | ICD-10-CM

## 2016-07-06 DIAGNOSIS — Z8673 Personal history of transient ischemic attack (TIA), and cerebral infarction without residual deficits: Secondary | ICD-10-CM | POA: Diagnosis not present

## 2016-07-06 DIAGNOSIS — Z794 Long term (current) use of insulin: Secondary | ICD-10-CM | POA: Diagnosis not present

## 2016-07-06 DIAGNOSIS — B1920 Unspecified viral hepatitis C without hepatic coma: Secondary | ICD-10-CM | POA: Diagnosis not present

## 2016-07-06 DIAGNOSIS — I130 Hypertensive heart and chronic kidney disease with heart failure and stage 1 through stage 4 chronic kidney disease, or unspecified chronic kidney disease: Secondary | ICD-10-CM | POA: Diagnosis not present

## 2016-07-06 DIAGNOSIS — M48061 Spinal stenosis, lumbar region without neurogenic claudication: Secondary | ICD-10-CM | POA: Diagnosis not present

## 2016-07-06 DIAGNOSIS — I429 Cardiomyopathy, unspecified: Secondary | ICD-10-CM | POA: Insufficient documentation

## 2016-07-06 DIAGNOSIS — I5020 Unspecified systolic (congestive) heart failure: Secondary | ICD-10-CM

## 2016-07-06 DIAGNOSIS — I2510 Atherosclerotic heart disease of native coronary artery without angina pectoris: Secondary | ICD-10-CM | POA: Diagnosis not present

## 2016-07-06 DIAGNOSIS — Z87891 Personal history of nicotine dependence: Secondary | ICD-10-CM | POA: Insufficient documentation

## 2016-07-06 DIAGNOSIS — Z7982 Long term (current) use of aspirin: Secondary | ICD-10-CM | POA: Diagnosis not present

## 2016-07-06 DIAGNOSIS — Z8546 Personal history of malignant neoplasm of prostate: Secondary | ICD-10-CM | POA: Insufficient documentation

## 2016-07-06 DIAGNOSIS — Z888 Allergy status to other drugs, medicaments and biological substances status: Secondary | ICD-10-CM | POA: Insufficient documentation

## 2016-07-06 DIAGNOSIS — I10 Essential (primary) hypertension: Secondary | ICD-10-CM

## 2016-07-06 DIAGNOSIS — E1136 Type 2 diabetes mellitus with diabetic cataract: Secondary | ICD-10-CM

## 2016-07-06 NOTE — Progress Notes (Signed)
Patient ID: [name] [name], male   DOB: 08-25-1955, 61 y.o.   MRN: [id number]    Advanced Heart Failure Clinic Note   Primary Care: Internal Medicine clinic, Dr. Silverio Decamp Primary Cardiologist: Dr. Doylene Canard  GI: Dr Ardis Hughs.  Primary HF: Dr. Haroldine Laws   HPI: [name] is a 19 y.o. 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 Had history of acute renal failure in setting of rhabdo requiring short term HD.    Admitted with new onset HF in April 2014.   He was diuresed and discharged home.  Discharge weight 256 pounds. Echo at the time showed EF 54-27%, grade 1 diastolic dysfunction.    Admitted to [facility] 5/30 through 06/27/12 with GI bleed. EGD 06/25/12 multiple ulcers noted. Continued on protonix.   Admitted 8/2 through 09/03/15 with marked volume overload in the setting of medication compliance. Diuresed with IV lasix and transitioned to torsemide 60 mg daily. Hospital course complicated by elevated renal function.Clonidine was stopped on discharge and carvedilol was cut back to 6.25 mg twice a day. Discharge weight 272 pounds.    He was admitted 11/21-11/24 for Hyperosmolar hyperglycemic state after he had stopped taking all of his diabetes medications.    He presents today for regular follow up.  At last visit Entresto increased. Feeling great. Of note, weight up 16 lbs since last visit.  Denies SOB. Having occasional peripheral edema and abdominal distention. Occasionally skips his torsemide. Occasional bendopnea and orthopnea, but not currently. No added salt. Drinking > 2 L daily. Walking most days. More difficult in the heat.   Echo 12/26/15 EF 30-35% RV ok.   ECHO 10/27/12 EF 40-45%  ECHO 06/04/15 LVEF 20-25%, decreased diastolic compliance, LAE, RV severely reduced.  ECHO  11/26/15 EF 30%   RHC 08/2015 RA = 7 RV = 35/10 PA = 37/15 (25) PCW = 10 Fick cardiac output/index = 4.3/1.7 PVR = 3.5 WU Ao sat = 95% PA sat = 58%, 58% Assessment: 1. Volume status looks good. 2. Cardiac output depressed  2014 LHC -  Left main: Normal LAD: Large vessel gives of 2 diagonals. Normal LCX: Very large vessel with 3 OMs. 20-30% plaque in midsection.  RCA: Dominant. Normal LV-gram done in the RAO projection: Ejection fraction = 35-40% global HK Assessment: 1. Minimal CAD  Labs 02/12/13 K 4.3 Creatinine 0.98          08/2013: K 3.9, creatinine 1.27, Mag 1.8, pro-BNP 392, Troponin 0.05          06/07/15: K 4.2, creatinine 1.21          07/10/2015: K 3.9 Creatinine 1.16           09/03/2015: K 4.7 Creatinine 1.32          09/11/2015: K 4.1 Creatinine 1.29   SH: Quit smoking 9 years ago. Lives alone. Quit ETOH Sep 29, 2013 FH: Mom died from MI in her early 52s  Brother MI and deceased. Does not know about fathers health history.   Review of systems complete and found to be negative unless listed in HPI.     Past Medical History:  Diagnosis Date  . Atrial fibrillation ([place])   . Cancer [facility])    Prostate cancer-bx. 3 weeks ago  . Cataract   . Chronic combined systolic and diastolic CHF (congestive heart failure) (  [place])    a) EF 40-45% per 2D echo (02/2012) with grade 1 DD b)  NICM c) RHC (04/2012): RA: 4, RV 45/3/4, PA 42/9 (24), PCWP 14, Fick CO/CI: 5.2 /2.2, PVR 1.9 WU, PA 62% and 64% d) ECHO (10/2012) EF 40-45%, grade II DD, RV nl  . CKD (chronic kidney disease) stage 2, GFR 60-89 ml/min    BL SCr approximately 1-1.3  . Colitis 05/2009   History of colitis of ascending colon noted on CT abd/pelvis (05/2009), with interval resolution with subsequent CT  . Continuous chronic alcoholism ([place])   . Degenerative lumbar spinal stenosis    s/p L2-3, L3-4, L4-5 laminectomy partial facetectomy, and bilateral foraminotomy  . Diabetes mellitus without complication ([place])    Type  II  . Dysrhythmia    A. Fib  . Family history of adverse reaction to anesthesia    "sister, can't go to sleep"  . GERD (gastroesophageal reflux disease)   . H/O cocaine abuse   . Hepatic steatosis    suspected 2/2 alcohol abuse  . Hepatitis C   . History of pancreatitis 01/2011   Admission for acute pancreatitis presumed 2/2 ongoing alcohol abuse- and hypertriglyceridemia  . History of pneumonia   . HIT (heparin-induced thrombocytopenia) ([place])   . Hyperlipidemia   . Hypertension    uncontrolled with medication noncompliance  . Hypertriglyceridemia   . NICM (nonischemic cardiomyopathy) ([place])    a. LHC (04/2012): nl arteries  . PFO (patent foramen ovale) 01/2012   with right to left shunt, noted per TEE in evaluation for source of embolic stroke in 03/7626  . Rhabdomyolysis 02/22/2012   H/O rhabdomyolysis in 01/2012 that was idiopathic, cause never identified  . Schizophrenia, schizo-affective ([place])   . Shortness of breath dyspnea   . Splenic cyst   . Stroke ([place]) 01/2012   Small cerebellar infarcts right greater than left as well as questionable acute left external capsule and caudate nuclear punctate lacunar infarcts noted per MRI (01/2012) - presumed to be embolic likely source PFO with right to left shunt (noted per TEE 01/ 2014)    Current Outpatient Prescriptions  Medication Sig Dispense Refill  . aspirin EC 81 MG tablet Take 81 mg by mouth daily.    . Blood Glucose Monitoring Suppl (ONE TOUCH ULTRA MINI) w/Device KIT 1 each by Does not apply route as needed. 1 each 0  . carvedilol (COREG) 6.25 MG tablet take 1 tablet by mouth twice a day with meals 60 tablet 3  . glucose blood (ONE TOUCH ULTRA TEST) test strip Use to check blood sugars 4 times daily 150 each 5  . Insulin Glargine (LANTUS SOLOSTAR) 100 UNIT/ML Solostar Pen Inject 50 Units into the skin daily at 10 pm.    . insulin lispro (HUMALOG) 100 UNIT/ML KiwkPen Inject 0.15 mLs (15 Units total) into the skin 3 (three) times  daily before meals. 15 mL 11  . Insulin Pen Needle (CAREFINE PEN NEEDLES) 32G X 5 MM MISC 1 each by Does not apply route as needed. 100 each 11  . metFORMIN (GLUCOPHAGE) 1000 MG tablet Take 1 tablet (1,000 mg total) by mouth 2 (two) times daily with a meal. 60 tablet 11  . ONETOUCH DELICA LANCETS 31D MISC Check blood sugar before meals and bedtime up to 4 times a day 200 each 5  . oxyCODONE-acetaminophen (PERCOCET) 10-325 MG tablet Take 1 tablet by mouth every 4 (four) hours as needed for pain.    Marland Kitchen QUEtiapine (SEROQUEL) 400 MG  tablet Take 400 mg by mouth at bedtime.     . rivaroxaban (XARELTO) 20 MG TABS tablet Take 1 tablet (20 mg total) by mouth daily. 90 tablet 3  . rosuvastatin (CRESTOR) 20 MG tablet Take 1 tablet (20 mg total) by mouth daily. 90 tablet 1  . sacubitril-valsartan (ENTRESTO) 97-103 MG Take 1 tablet by mouth 2 (two) times daily. 60 tablet 1  . sitaGLIPtin (JANUVIA) 100 MG tablet Take 1 tablet (100 mg total) by mouth daily. 30 tablet 4  . spironolactone (ALDACTONE) 25 MG tablet Take 1 tablet (25 mg total) by mouth daily. 90 tablet 3  . torsemide (DEMADEX) 20 MG tablet Take 60 mg by mouth daily.     No current facility-administered medications for this encounter.     Allergies  Allergen Reactions  . Heparin Other (See Comments)    Documented HIT under problem list Pt states he's not allergic. " They told me not take it anymore"  . Thorazine [Chlorpromazine Hcl] Other (See Comments)    Body freezes up     Vitals:   07/06/16 1529  BP: 103/74  Weight: 297 lb 6 oz (134.9 kg)   Wt Readings from Last 3 Encounters:  07/06/16 297 lb 6 oz (134.9 kg)  06/23/16 297 lb (134.7 kg)  05/26/16 289 lb 14.4 oz (131.5 kg)     PHYSICAL EXAM: General: Well appearing. No resp difficulty. HEENT: normal Neck: supple. JVP 6-7. Carotids 2+ bilat; no bruits. No thyromegaly or nodule noted. Cor: PMI nondisplaced. RRR, No M/G/R noted Lungs: CTAB, normal effort. Abdomen: Obese, soft,  non-tender, distended, no HSM. No bruits or masses. +BS  Extremities: no cyanosis, clubbing, rash, R and LLE no edema.  Neuro: alert & orientedx3, cranial nerves grossly intact. moves all 4 extremities w/o difficulty. Affect pleasant   EKG: NSR 87 bpm with QRS 104 ms  ASSESSMENT /PLAN 1) Chronic combined systolic/diastolic HF: EF 11-91%, grade II DD (10/2012) now decreased to 20-25%  (05/2015) RV severely reduced. Echo EF 30-35% RV ok. Had [facility] 2014 with minimal CAD.  -NYHA II.  Volume status stable on exam despite weight gain.   - Continue torsemide 60 mg daily.  - Continue spironolactone 25 mg daily.  - Continue carvedilol 6.25 mg BID - Continue Entresto 97-103 mg BID. BMET last week stable.  - Would not pursue ICD at this as EF appears to be improving and medication compliance not optimal. Will repeat echo in 2 months and if EF < 35% consider ICD. (He would be ok with proceeding if needed) - Reinforced fluid restriction to < 2 L daily, sodium restriction to less than 2000 mg daily, and the importance of daily weights.    2) HTN - Meds as above.   3) PAF:  - EKG today shows NSR with 1st degree AV block QRS 104 bpm - Continue Xarelto 20 mg daily. Denies bleeding 4) ETOH abuse-  - Abstinent.  5) CKD stage II - Stable on labs last week.  6) ?OSA - Missed appt for sleep study. Willing to reschedule.  7) DMII - Per PCP.  8) Medication Noncompliance - Much improved.   Follow up 2 months with Echo. Recent blood work stable. Needs to reschedule sleep study.l   Shirley Friar, PA-C  3:38 PM

## 2016-07-06 NOTE — Patient Instructions (Addendum)
No changes to medication today.  No lab work today.  EKG today.  Will schedule you for sleep study at [facility]. Address: [address], [place], [place] [address] Phone: [phone]  ___________________________________________________  ___________________________________________________  Follow up 2 months with echocardiogram and appointment with Dr. Haroldine Laws. Take all medication as prescribed the day of your appointment. Bring all medications with you to your appointment.  Do the following things EVERYDAY: 1) Weigh yourself in the morning before breakfast. Write it down and keep it in a log. 2) Take your medicines as prescribed 3) Eat low salt foods-Limit salt (sodium) to 2000 mg per day.  4) Stay as active as you can everyday 5) Limit all fluids for the day to less than 2 liters

## 2016-07-21 DIAGNOSIS — Z794 Long term (current) use of insulin: Secondary | ICD-10-CM | POA: Diagnosis not present

## 2016-07-21 DIAGNOSIS — H40032 Anatomical narrow angle, left eye: Secondary | ICD-10-CM | POA: Diagnosis not present

## 2016-07-21 DIAGNOSIS — H18413 Arcus senilis, bilateral: Secondary | ICD-10-CM | POA: Diagnosis not present

## 2016-07-21 DIAGNOSIS — H401131 Primary open-angle glaucoma, bilateral, mild stage: Secondary | ICD-10-CM | POA: Diagnosis not present

## 2016-07-21 DIAGNOSIS — H47233 Glaucomatous optic atrophy, bilateral: Secondary | ICD-10-CM | POA: Diagnosis not present

## 2016-07-22 DIAGNOSIS — M545 Low back pain: Secondary | ICD-10-CM | POA: Diagnosis not present

## 2016-07-22 DIAGNOSIS — G894 Chronic pain syndrome: Secondary | ICD-10-CM | POA: Diagnosis not present

## 2016-08-04 ENCOUNTER — Encounter: Payer: Self-pay | Admitting: Internal Medicine

## 2016-08-04 ENCOUNTER — Ambulatory Visit: Payer: Self-pay | Admitting: Dietician

## 2016-08-19 DIAGNOSIS — G894 Chronic pain syndrome: Secondary | ICD-10-CM | POA: Diagnosis not present

## 2016-08-19 DIAGNOSIS — M545 Low back pain: Secondary | ICD-10-CM | POA: Diagnosis not present

## 2016-08-25 DIAGNOSIS — Z961 Presence of intraocular lens: Secondary | ICD-10-CM | POA: Diagnosis not present

## 2016-08-25 DIAGNOSIS — H2512 Age-related nuclear cataract, left eye: Secondary | ICD-10-CM | POA: Diagnosis not present

## 2016-08-25 DIAGNOSIS — H40013 Open angle with borderline findings, low risk, bilateral: Secondary | ICD-10-CM | POA: Diagnosis not present

## 2016-09-03 ENCOUNTER — Encounter (HOSPITAL_BASED_OUTPATIENT_CLINIC_OR_DEPARTMENT_OTHER): Payer: Self-pay

## 2016-09-09 ENCOUNTER — Encounter (HOSPITAL_COMMUNITY): Payer: Self-pay | Admitting: Internal Medicine

## 2016-09-09 ENCOUNTER — Ambulatory Visit (HOSPITAL_COMMUNITY): Payer: Medicare Other

## 2016-09-11 ENCOUNTER — Other Ambulatory Visit: Payer: Self-pay | Admitting: Internal Medicine

## 2016-09-11 ENCOUNTER — Other Ambulatory Visit (HOSPITAL_COMMUNITY): Payer: Self-pay | Admitting: Adult Health

## 2016-09-11 DIAGNOSIS — I5020 Unspecified systolic (congestive) heart failure: Secondary | ICD-10-CM

## 2016-09-20 DIAGNOSIS — H2512 Age-related nuclear cataract, left eye: Secondary | ICD-10-CM | POA: Diagnosis not present

## 2016-09-23 DIAGNOSIS — M545 Low back pain: Secondary | ICD-10-CM | POA: Diagnosis not present

## 2016-09-23 DIAGNOSIS — G894 Chronic pain syndrome: Secondary | ICD-10-CM | POA: Diagnosis not present

## 2016-09-23 DIAGNOSIS — Z79891 Long term (current) use of opiate analgesic: Secondary | ICD-10-CM | POA: Diagnosis not present

## 2016-09-24 DIAGNOSIS — H40012 Open angle with borderline findings, low risk, left eye: Secondary | ICD-10-CM | POA: Diagnosis not present

## 2016-09-24 DIAGNOSIS — H401121 Primary open-angle glaucoma, left eye, mild stage: Secondary | ICD-10-CM | POA: Diagnosis not present

## 2016-09-24 DIAGNOSIS — H2512 Age-related nuclear cataract, left eye: Secondary | ICD-10-CM | POA: Diagnosis not present

## 2016-10-01 ENCOUNTER — Ambulatory Visit (INDEPENDENT_AMBULATORY_CARE_PROVIDER_SITE_OTHER): Payer: Medicare Other | Admitting: Internal Medicine

## 2016-10-01 ENCOUNTER — Encounter: Payer: Self-pay | Admitting: Internal Medicine

## 2016-10-01 ENCOUNTER — Inpatient Hospital Stay (HOSPITAL_COMMUNITY)
Admission: AD | Admit: 2016-10-01 | Discharge: 2016-10-05 | DRG: 291 | Disposition: A | Discharge status: Home / Self Care | Payer: Medicare Other | Source: Ambulatory Visit | Attending: Internal Medicine | Admitting: Internal Medicine

## 2016-10-01 ENCOUNTER — Inpatient Hospital Stay (HOSPITAL_COMMUNITY): Payer: Medicare Other

## 2016-10-01 VITALS — BP 157/69 | HR 90 | Temp 98.0°F | Ht 72.0 in | Wt 302.8 lb

## 2016-10-01 DIAGNOSIS — I480 Paroxysmal atrial fibrillation: Secondary | ICD-10-CM | POA: Diagnosis not present

## 2016-10-01 DIAGNOSIS — I509 Heart failure, unspecified: Secondary | ICD-10-CM | POA: Insufficient documentation

## 2016-10-01 DIAGNOSIS — E669 Obesity, unspecified: Secondary | ICD-10-CM | POA: Diagnosis present

## 2016-10-01 DIAGNOSIS — F1410 Cocaine abuse, uncomplicated: Secondary | ICD-10-CM | POA: Diagnosis present

## 2016-10-01 DIAGNOSIS — N182 Chronic kidney disease, stage 2 (mild): Secondary | ICD-10-CM | POA: Diagnosis not present

## 2016-10-01 DIAGNOSIS — Z23 Encounter for immunization: Secondary | ICD-10-CM | POA: Diagnosis not present

## 2016-10-01 DIAGNOSIS — I130 Hypertensive heart and chronic kidney disease with heart failure and stage 1 through stage 4 chronic kidney disease, or unspecified chronic kidney disease: Principal | ICD-10-CM | POA: Diagnosis present

## 2016-10-01 DIAGNOSIS — Z794 Long term (current) use of insulin: Secondary | ICD-10-CM

## 2016-10-01 DIAGNOSIS — I2510 Atherosclerotic heart disease of native coronary artery without angina pectoris: Secondary | ICD-10-CM | POA: Diagnosis present

## 2016-10-01 DIAGNOSIS — I5023 Acute on chronic systolic (congestive) heart failure: Secondary | ICD-10-CM | POA: Diagnosis present

## 2016-10-01 DIAGNOSIS — Z8673 Personal history of transient ischemic attack (TIA), and cerebral infarction without residual deficits: Secondary | ICD-10-CM

## 2016-10-01 DIAGNOSIS — Z79899 Other long term (current) drug therapy: Secondary | ICD-10-CM | POA: Diagnosis not present

## 2016-10-01 DIAGNOSIS — K219 Gastro-esophageal reflux disease without esophagitis: Secondary | ICD-10-CM | POA: Diagnosis present

## 2016-10-01 DIAGNOSIS — I440 Atrioventricular block, first degree: Secondary | ICD-10-CM

## 2016-10-01 DIAGNOSIS — Z7901 Long term (current) use of anticoagulants: Secondary | ICD-10-CM

## 2016-10-01 DIAGNOSIS — Z9114 Patient's other noncompliance with medication regimen: Secondary | ICD-10-CM | POA: Diagnosis not present

## 2016-10-01 DIAGNOSIS — I429 Cardiomyopathy, unspecified: Secondary | ICD-10-CM | POA: Diagnosis present

## 2016-10-01 DIAGNOSIS — F209 Schizophrenia, unspecified: Secondary | ICD-10-CM | POA: Diagnosis present

## 2016-10-01 DIAGNOSIS — I5043 Acute on chronic combined systolic (congestive) and diastolic (congestive) heart failure: Secondary | ICD-10-CM | POA: Diagnosis not present

## 2016-10-01 DIAGNOSIS — Z87891 Personal history of nicotine dependence: Secondary | ICD-10-CM

## 2016-10-01 DIAGNOSIS — I501 Left ventricular failure: Secondary | ICD-10-CM

## 2016-10-01 DIAGNOSIS — Z6839 Body mass index (BMI) 39.0-39.9, adult: Secondary | ICD-10-CM | POA: Diagnosis not present

## 2016-10-01 DIAGNOSIS — E1122 Type 2 diabetes mellitus with diabetic chronic kidney disease: Secondary | ICD-10-CM | POA: Diagnosis not present

## 2016-10-01 DIAGNOSIS — R05 Cough: Secondary | ICD-10-CM

## 2016-10-01 DIAGNOSIS — Z8546 Personal history of malignant neoplasm of prostate: Secondary | ICD-10-CM | POA: Diagnosis not present

## 2016-10-01 DIAGNOSIS — I5020 Unspecified systolic (congestive) heart failure: Secondary | ICD-10-CM

## 2016-10-01 DIAGNOSIS — I5030 Unspecified diastolic (congestive) heart failure: Secondary | ICD-10-CM | POA: Diagnosis not present

## 2016-10-01 DIAGNOSIS — R238 Other skin changes: Secondary | ICD-10-CM

## 2016-10-01 DIAGNOSIS — I10 Essential (primary) hypertension: Secondary | ICD-10-CM | POA: Diagnosis present

## 2016-10-01 DIAGNOSIS — E785 Hyperlipidemia, unspecified: Secondary | ICD-10-CM | POA: Diagnosis not present

## 2016-10-01 DIAGNOSIS — Z8719 Personal history of other diseases of the digestive system: Secondary | ICD-10-CM | POA: Diagnosis not present

## 2016-10-01 DIAGNOSIS — I493 Ventricular premature depolarization: Secondary | ICD-10-CM

## 2016-10-01 DIAGNOSIS — E119 Type 2 diabetes mellitus without complications: Secondary | ICD-10-CM

## 2016-10-01 DIAGNOSIS — R0602 Shortness of breath: Secondary | ICD-10-CM | POA: Diagnosis not present

## 2016-10-01 DIAGNOSIS — Z9889 Other specified postprocedural states: Secondary | ICD-10-CM

## 2016-10-01 LAB — CBC WITH DIFFERENTIAL/PLATELET
Basophils Absolute: 0 10*3/uL (ref 0.0–0.1)
Basophils Relative: 0 %
Eosinophils Absolute: 0.2 10*3/uL (ref 0.0–0.7)
Eosinophils Relative: 4 %
HCT: 49.8 % (ref 39.0–52.0)
Hemoglobin: 16 g/dL (ref 13.0–17.0)
Lymphocytes Relative: 37 %
Lymphs Abs: 2.2 10*3/uL (ref 0.7–4.0)
MCH: 29.5 pg (ref 26.0–34.0)
MCHC: 32.1 g/dL (ref 30.0–36.0)
MCV: 91.9 fL (ref 78.0–100.0)
Monocytes Absolute: 0.6 10*3/uL (ref 0.1–1.0)
Monocytes Relative: 11 %
Neutro Abs: 2.8 10*3/uL (ref 1.7–7.7)
Neutrophils Relative %: 48 %
Platelets: 168 10*3/uL (ref 150–400)
RBC: 5.42 MIL/uL (ref 4.22–5.81)
RDW: 14.1 % (ref 11.5–15.5)
WBC: 5.8 10*3/uL (ref 4.0–10.5)

## 2016-10-01 LAB — COMPREHENSIVE METABOLIC PANEL
ALT: 30 U/L (ref 17–63)
AST: 36 U/L (ref 15–41)
Albumin: 4.2 g/dL (ref 3.5–5.0)
Alkaline Phosphatase: 93 U/L (ref 38–126)
Anion gap: 9 (ref 5–15)
BUN: 17 mg/dL (ref 6–20)
CO2: 29 mmol/L (ref 22–32)
Calcium: 9.9 mg/dL (ref 8.9–10.3)
Chloride: 102 mmol/L (ref 101–111)
Creatinine, Ser: 1.18 mg/dL (ref 0.61–1.24)
GFR calc Af Amer: >60 mL/min (ref >60)
GFR calc non Af Amer: >60 mL/min (ref >60)
Glucose, Bld: 198 mg/dL — ABNORMAL HIGH (ref 65–99)
Potassium: 4.1 mmol/L (ref 3.5–5.1)
Sodium: 140 mmol/L (ref 135–145)
Total Bilirubin: 0.9 mg/dL (ref 0.3–1.2)
Total Protein: 7.7 g/dL (ref 6.5–8.1)

## 2016-10-01 LAB — GLUCOSE, CAPILLARY: GLUCOSE-CAPILLARY: 202 mg/dL — AB (ref 65–99)

## 2016-10-01 LAB — HEMOGLOBIN A1C
Hgb A1c MFr Bld: 7.3 % — ABNORMAL HIGH (ref 4.8–5.6)
Mean Plasma Glucose: 162.81 mg/dL

## 2016-10-01 LAB — BRAIN NATRIURETIC PEPTIDE: B Natriuretic Peptide: 119.8 pg/mL — ABNORMAL HIGH (ref 0.0–100.0)

## 2016-10-01 MED ORDER — OXYCODONE-ACETAMINOPHEN 5-325 MG PO TABS
1.0000 | ORAL_TABLET | ORAL | Status: DC | PRN
  Administered 2016-10-02 – 2016-10-04 (×4): 1 via ORAL
  Filled 2016-10-01 (×4): qty 1

## 2016-10-01 MED ORDER — SPIRONOLACTONE 25 MG PO TABS
25.0000 mg | ORAL_TABLET | Freq: Every day | ORAL | Status: DC
  Administered 2016-10-01 – 2016-10-05 (×5): 25 mg via ORAL
  Filled 2016-10-01 (×5): qty 1

## 2016-10-01 MED ORDER — CARVEDILOL 6.25 MG PO TABS
6.2500 mg | ORAL_TABLET | Freq: Two times a day (BID) | ORAL | Status: DC
  Administered 2016-10-02 – 2016-10-05 (×7): 6.25 mg via ORAL
  Filled 2016-10-01 (×8): qty 1

## 2016-10-01 MED ORDER — ONDANSETRON HCL 4 MG/2ML IJ SOLN: 4.0000 mg | Freq: Four times a day (QID) | INTRAMUSCULAR | Status: DC | PRN

## 2016-10-01 MED ORDER — INSULIN ASPART 100 UNIT/ML ~~LOC~~ SOLN
6.0000 [IU] | Freq: Three times a day (TID) | SUBCUTANEOUS | Status: DC
  Administered 2016-10-02 – 2016-10-05 (×10): 6 [IU] via SUBCUTANEOUS

## 2016-10-01 MED ORDER — FUROSEMIDE 10 MG/ML IJ SOLN
80.0000 mg | Freq: Two times a day (BID) | INTRAMUSCULAR | Status: DC
  Administered 2016-10-01 – 2016-10-04 (×6): 80 mg via INTRAVENOUS
  Filled 2016-10-01 (×6): qty 8

## 2016-10-01 MED ORDER — OXYCODONE-ACETAMINOPHEN 10-325 MG PO TABS: 1.0000 | ORAL_TABLET | ORAL | Status: DC | PRN

## 2016-10-01 MED ORDER — QUETIAPINE FUMARATE 400 MG PO TABS
400.0000 mg | ORAL_TABLET | Freq: Every day | ORAL | Status: DC
  Administered 2016-10-01 – 2016-10-04 (×4): 400 mg via ORAL
  Filled 2016-10-01 (×4): qty 1

## 2016-10-01 MED ORDER — INSULIN ASPART 100 UNIT/ML ~~LOC~~ SOLN
0.0000 [IU] | Freq: Every day | SUBCUTANEOUS | Status: DC
  Administered 2016-10-01: 2 [IU] via SUBCUTANEOUS
  Administered 2016-10-02: 3 [IU] via SUBCUTANEOUS
  Administered 2016-10-03: 2 [IU] via SUBCUTANEOUS
  Administered 2016-10-04: 3 [IU] via SUBCUTANEOUS

## 2016-10-01 MED ORDER — RIVAROXABAN 20 MG PO TABS: 20.0000 mg | ORAL_TABLET | Freq: Every day | ORAL | 3 refills | Status: DC

## 2016-10-01 MED ORDER — LIFITEGRAST 5 % OP SOLN: 1.0000 [drp] | Freq: Four times a day (QID) | OPHTHALMIC | Status: DC

## 2016-10-01 MED ORDER — ONDANSETRON HCL 4 MG PO TABS: 4.0000 mg | ORAL_TABLET | Freq: Four times a day (QID) | ORAL | Status: DC | PRN

## 2016-10-01 MED ORDER — LATANOPROST 0.005 % OP SOLN
1.0000 [drp] | Freq: Every day | OPHTHALMIC | Status: DC
  Administered 2016-10-01: 1 [drp] via OPHTHALMIC
  Filled 2016-10-01: qty 2.5

## 2016-10-01 MED ORDER — ACETAMINOPHEN 325 MG PO TABS: 650.0000 mg | ORAL_TABLET | Freq: Four times a day (QID) | ORAL | Status: DC | PRN

## 2016-10-01 MED ORDER — POLYETHYLENE GLYCOL 3350 17 G PO PACK
17.0000 g | PACK | Freq: Every day | ORAL | Status: DC | PRN
  Administered 2016-10-04: 17 g via ORAL
  Filled 2016-10-01: qty 1

## 2016-10-01 MED ORDER — INSULIN GLARGINE 100 UNIT/ML ~~LOC~~ SOLN
30.0000 [IU] | Freq: Every day | SUBCUTANEOUS | Status: DC
  Administered 2016-10-01 – 2016-10-04 (×4): 30 [IU] via SUBCUTANEOUS
  Filled 2016-10-01 (×5): qty 0.3

## 2016-10-01 MED ORDER — ENOXAPARIN SODIUM 40 MG/0.4ML ~~LOC~~ SOLN: 40.0000 mg | SUBCUTANEOUS | Status: DC

## 2016-10-01 MED ORDER — RIVAROXABAN 20 MG PO TABS
20.0000 mg | ORAL_TABLET | Freq: Every day | ORAL | Status: DC
  Administered 2016-10-02 – 2016-10-05 (×4): 20 mg via ORAL
  Filled 2016-10-01 (×4): qty 1

## 2016-10-01 MED ORDER — OFLOXACIN 0.3 % OP SOLN
1.0000 [drp] | Freq: Four times a day (QID) | OPHTHALMIC | Status: DC
  Administered 2016-10-01 – 2016-10-02 (×2): 1 [drp] via OPHTHALMIC
  Filled 2016-10-01: qty 5

## 2016-10-01 MED ORDER — SACUBITRIL-VALSARTAN 97-103 MG PO TABS
1.0000 | ORAL_TABLET | Freq: Two times a day (BID) | ORAL | Status: DC
  Administered 2016-10-01 – 2016-10-05 (×8): 1 via ORAL
  Filled 2016-10-01 (×8): qty 1

## 2016-10-01 MED ORDER — INSULIN ASPART 100 UNIT/ML ~~LOC~~ SOLN
0.0000 [IU] | Freq: Three times a day (TID) | SUBCUTANEOUS | Status: DC
  Administered 2016-10-02: 11 [IU] via SUBCUTANEOUS
  Administered 2016-10-02 – 2016-10-03 (×2): 7 [IU] via SUBCUTANEOUS
  Administered 2016-10-03: 4 [IU] via SUBCUTANEOUS
  Administered 2016-10-03: 7 [IU] via SUBCUTANEOUS
  Administered 2016-10-04: 4 [IU] via SUBCUTANEOUS
  Administered 2016-10-04: 15 [IU] via SUBCUTANEOUS
  Administered 2016-10-04 – 2016-10-05 (×2): 7 [IU] via SUBCUTANEOUS
  Administered 2016-10-05: 20 [IU] via SUBCUTANEOUS

## 2016-10-01 MED ORDER — ACETAMINOPHEN 650 MG RE SUPP: 650.0000 mg | Freq: Four times a day (QID) | RECTAL | Status: DC | PRN

## 2016-10-01 MED ORDER — LATANOPROST 0.005 % OP SOLN: 1.0000 [drp] | Freq: Every day | OPHTHALMIC | Status: DC

## 2016-10-01 MED ORDER — OXYCODONE HCL 5 MG PO TABS
5.0000 mg | ORAL_TABLET | ORAL | Status: DC | PRN
  Administered 2016-10-02 – 2016-10-04 (×4): 5 mg via ORAL
  Filled 2016-10-01 (×4): qty 1

## 2016-10-01 MED ORDER — ROSUVASTATIN CALCIUM 20 MG PO TABS
20.0000 mg | ORAL_TABLET | Freq: Every day | ORAL | Status: DC
  Administered 2016-10-02 – 2016-10-05 (×4): 20 mg via ORAL
  Filled 2016-10-01 (×4): qty 1

## 2016-10-01 NOTE — Assessment & Plan Note (Signed)
Patient likely not taking xarelto consistently as last fill was 4/18 for 90 day supply; refilled today. Unlikely that his shortness of breath is due to or contributed to PE due to gradual weight gain over several months.

## 2016-10-01 NOTE — Progress Notes (Signed)
MD paged for orders.  Will continue to monitor.

## 2016-10-01 NOTE — Assessment & Plan Note (Addendum)
Patient with progressive dyspnea on exertion, orthopnea, and weight gain in setting of likely some inconsistency in medication and diet adherence, and findings of volume overload and exertional desaturation consistent with moderate CHF exacerbation.   Patient is in agreement with admission to the hospital for volume control.   Plan: --admit to telemetry; discussed with Dr. Reesa Chew with [facility] team who is in agreement with admission.

## 2016-10-01 NOTE — Progress Notes (Signed)
CC: Shortness of breath  HPI:  [name] [name] is a 61 y.o. with a PMH of combined systolic systolic and diastolic heart failure, hypertension, hyperlipidemia, paroxysmal A. fib on chronic [place], type 2 diabetes, history of GI bleed history of GI bleed, history of CVA secondary embolic stroke due to PFO, CTD stage II presenting to clinic for 2 week history of progressive dyspnea on exertion.  Patient endorses progressive two-week history of dyspnea on exertion, orthopnea, and increased abdominal girth. Patient denies chest pain. He endorses compliance with his blood pressure medicines and diuretics (though did run out of spironolactone 2 days ago) , and he endorses compliance with Xarelto. Patient brought in his medicines and refill dates on Xarelto and spironolactone were 05/12/16 for 90 day supply. He says that he has been watching his fluid intake and salt intake but endorses eating deli [place] sandwiches and hamburgers these last two weeks because he's felt bad and couldn't cook. Patient's weight has been slowly uptrending from 270s to 302 today over several months.   Patient denies alcohol use, he endorses last use of cocaine a couple of months ago.  Patient had cataract and glaucoma surgery a few weeks ago and was told that he woke up gasping a couple of times during the procedure. He was previously scheduled for a sleep study but was unable to make the appointment.  He endorses a chronic dry cough that is unchanged; he denies sick contacts, fever, chills, n/v/abd pain, urinary symptoms, hematuria, hematochezia or melena.  Please see problem based Assessment and Plan for status of patients chronic conditions.  Past Medical History:  Diagnosis Date  . Atrial fibrillation ([place])   . Cancer [facility])    Prostate cancer-bx. 3 weeks ago  . Cataract   . Chronic combined systolic and diastolic CHF (congestive heart failure) ([facility])    a) EF 40-45% per 2D echo (02/2012) with grade 1 DD b)   NICM c) RHC (04/2012): RA: 4, RV 45/3/4, PA 42/9 (24), PCWP 14, Fick CO/CI: 5.2 /2.2, PVR 1.9 WU, PA 62% and 64% d) ECHO (10/2012) EF 40-45%, grade II DD, RV nl  . CKD (chronic kidney disease) stage 2, GFR 60-89 ml/min    BL SCr approximately 1-1.3  . Colitis 05/2009   History of colitis of ascending colon noted on CT abd/pelvis (05/2009), with interval resolution with subsequent CT  . Continuous chronic alcoholism ([place])   . Degenerative lumbar spinal stenosis    s/p L2-3, L3-4, L4-5 laminectomy partial facetectomy, and bilateral foraminotomy  . Diabetes mellitus without complication ([place])    Type II  . Dysrhythmia    A. Fib  . Family history of adverse reaction to anesthesia    "sister, can't go to sleep"  . GERD (gastroesophageal reflux disease)   . H/O cocaine abuse   . Hepatic steatosis    suspected 2/2 alcohol abuse  . Hepatitis C   . History of pancreatitis 01/2011   Admission for acute pancreatitis presumed 2/2 ongoing alcohol abuse- and hypertriglyceridemia  . History of pneumonia   . HIT (heparin-induced thrombocytopenia) ([place])   . Hyperlipidemia   . Hypertension    uncontrolled with medication noncompliance  . Hypertriglyceridemia   . NICM (nonischemic cardiomyopathy) ([place])    a. LHC (04/2012): nl arteries  . PFO (patent foramen ovale) 01/2012   with right to left shunt, noted per TEE in evaluation for source of embolic stroke in 05/4648  . Rhabdomyolysis 02/22/2012   H/O rhabdomyolysis in 01/2012 that  was idiopathic, cause never identified  . Schizophrenia, schizo-affective ([place])   . Shortness of breath dyspnea   . Splenic cyst   . Stroke ([place]) 01/2012   Small cerebellar infarcts right greater than left as well as questionable acute left external capsule and caudate nuclear punctate lacunar infarcts noted per MRI (01/2012) - presumed to be embolic likely source PFO with right to left shunt (noted per TEE 01/ 2014)    Review of Systems:   ROS Per HPI  Physical  Exam:  Vitals:   10/01/16 1339  BP: (!) 157/69  Pulse: 90  Temp: 98 F (36.7 C)  TempSrc: Oral  SpO2: 94%  Weight: (!) 302 lb 12.8 oz (137.3 kg)  Height: 6' (1.829 m)   Resting O2 sat 98%; walking desats to 86%.  GENERAL- alert, co-operative, appears as stated age, not in any distress. HEENT- Atraumatic, normocephalic, injected conjunctiva, EOMI, oral mucosa appears moist CARDIAC- irregularly irregular rhythm, N S1/S2, no murmurs, rubs or gallops appreciated. JVD to ear. RESP- Better air movement on R compared to left though still moving air adequately; distant breath sounds; no wheezes or crackles. ABDOMEN- Soft, distended, nontender, bowel sounds present. NEURO- No obvious Cr N abnormality. EXTREMITIES- pulse 2+, symmetric; warm extremities; 1+ pitting edema in bil LEs, right calf chronically greater than left calf per patient; no palpable cords SKIN- warm, dry, chronic bil LE skin changes. PSYCH- Normal mood and affect, appropriate thought content and speech.  EKG personally reviewed - sinus rhythm, 1st degree heart block, occasional PVC, mild flattening of t waves in V1-2.   Assessment & Plan:   See Encounters Tab for problem based charting.   Patient discussed with Dr. Gerrit Friends, MD Internal Medicine PGY2

## 2016-10-01 NOTE — H&P (Signed)
Date: 10/01/2016               Patient Name:  [name] [name] MRN: [id number]  DOB: 05-25-1955 Age / Sex: 61 y.o., male   PCP: [name] Contes, MD         Medical Service: Internal Medicine Teaching Service         Attending Physician: Dr. Lucious Groves, DO    First Contact: Dr. Berline [name] Pager: [phone]  Second Contact: Dr. Reesa [name] Pager: [phone]       After Hours (After 5p/  First Contact Pager: [phone]  weekends / holidays): Second Contact Pager: [phone]   Chief Complaint: "I can't catch my breath"  History of Present Illness: [name] [name] is a 61 year old male who presented to the Internal medicine clinic with a month long history of shortness of breath and bilateral lower extremity edema which acutely worsened over the past 2 days. He has a past medical history consistent for chronic kidney disease stage II, hypertension, paroxysmal atrial fibrillation, schizophrenia, CVA, gastric ulcer with hemorrhage, HFrEF, and diabetes. He states that approximately 2 days prior he developed severe shortness of breath which worsened to the extent that he can no longer tolerate it. He states that typically he is able to walk approximately 100 yards without severe shortness of breath but at this time he is unable to walk more than 15-20 yards before he takes a break. In the past when this would begin to occur he would take his diuretic at a slightly higher dose and this would resolve over a few days. Patient stated that in addition to his shortness of breath, he has had a chronic nonproductive cough. The bilateral lower extremity pitting edema is an ongoing issue that has worsened over the last 2-3 weeks. Patient stated that he has been taking his Torsemide 20mg  TID as a 60mg  once daily dosing. He stated that he is compliant the the remainder of his medications.   In the clinic the patient was evaluated with EKG as described below. Given his presentation no other labs were completed prior to  his admission. Patient will be admitted to the floor and evaluated for acute on chronic congestive heart failure exacerbation.   Meds:  Current Meds  Medication Sig  . carvedilol (COREG) 6.25 MG tablet take 1 tablet by mouth twice a day with meals (Patient taking differently: take 6.25 mg tablet by mouth twice a day with meals)  . ENTRESTO 97-103 MG take 1 tablet by mouth twice a day  . Insulin Glargine (LANTUS SOLOSTAR) 100 UNIT/ML Solostar Pen Inject 50 Units into the skin daily at 10 pm.  . insulin lispro (HUMALOG) 100 UNIT/ML KiwkPen Inject 0.15 mLs (15 Units total) into the skin 3 (three) times daily before meals. (Patient taking differently: Inject 18 Units into the skin 3 (three) times daily before meals. )  . ofloxacin (OCUFLOX) 0.3 % ophthalmic solution Place 1 drop into the left eye 4 (four) times daily.   Marland Kitchen oxyCODONE-acetaminophen (PERCOCET) 10-325 MG tablet Take 1 tablet by mouth every 4 (four) hours as needed for pain.  . prednisoLONE acetate (PRED FORTE) 1 % ophthalmic suspension Place 1 drop into the left eye 4 (four) times daily.  . QUEtiapine (SEROQUEL) 400 MG tablet Take 400 mg by mouth at bedtime.   . rivaroxaban (XARELTO) 20 MG TABS tablet Take 1 tablet (20 mg total) by mouth daily.  . rosuvastatin (CRESTOR) 20 MG tablet Take 1 tablet (20 mg total) by mouth  daily.  . torsemide (DEMADEX) 20 MG tablet take 3 tablets by mouth once daily  . TRAVATAN Z 0.004 % SOLN ophthalmic solution Place 1 drop into the left eye 4 (four) times daily.  Marland Kitchen XIIDRA 5 % SOLN Place 1 drop into the left eye 4 (four) times daily.    Allergies: Allergies as of 10/01/2016 - Review Complete 10/01/2016  Allergen Reaction Noted  . Heparin Other (See Comments) 12/16/2012  . Thorazine [chlorpromazine hcl] Other (See Comments)    Past Medical History:  Diagnosis Date  . Atrial fibrillation ([place])   . Cancer [facility])    Prostate cancer-bx. 3 weeks ago  . Cataract   . Chronic combined systolic and diastolic  CHF (congestive heart failure) (HCC)    a) EF 40-45% per 2D echo (02/2012) with grade 1 DD b)  NICM c) RHC (04/2012): RA: 4, RV 45/3/4, PA 42/9 (24), PCWP 14, Fick CO/CI: 5.2 /2.2, PVR 1.9 WU, PA 62% and 64% d) ECHO (10/2012) EF 40-45%, grade II DD, RV nl  . CKD (chronic kidney disease) stage 2, GFR 60-89 ml/min    BL SCr approximately 1-1.3  . Colitis 05/2009   History of colitis of ascending colon noted on CT abd/pelvis (05/2009), with interval resolution with subsequent CT  . Continuous chronic alcoholism ([place])   . Degenerative lumbar spinal stenosis    s/p L2-3, L3-4, L4-5 laminectomy partial facetectomy, and bilateral foraminotomy  . Diabetes mellitus without complication ([name])    Type II  . Dysrhythmia    A. Fib  . Family history of adverse reaction to anesthesia    "sister, can't go to sleep"  . GERD (gastroesophageal reflux disease)   . H/O cocaine abuse   . Hepatic steatosis    suspected 2/2 alcohol abuse  . Hepatitis C   . History of pancreatitis 01/2011   Admission for acute pancreatitis presumed 2/2 ongoing alcohol abuse- and hypertriglyceridemia  . History of pneumonia   . HIT (heparin-induced thrombocytopenia) ([place])   . Hyperlipidemia   . Hypertension    uncontrolled with medication noncompliance  . Hypertriglyceridemia   . NICM (nonischemic cardiomyopathy) ([place])    a. LHC (04/2012): nl arteries  . PFO (patent foramen ovale) 01/2012   with right to left shunt, noted per TEE in evaluation for source of embolic stroke in 05/8830  . Rhabdomyolysis 02/22/2012   H/O rhabdomyolysis in 01/2012 that was idiopathic, cause never identified  . Schizophrenia, schizo-affective ([place])   . Shortness of breath dyspnea   . Splenic cyst   . Stroke ([place]) 01/2012   Small cerebellar infarcts right greater than left as well as questionable acute left external capsule and caudate nuclear punctate lacunar infarcts noted per MRI (01/2012) - presumed to be embolic likely source PFO with right to  left shunt (noted per TEE 01/ 2014)    Family History:  Family History  Problem Relation Age of Onset  . CAD Mother 46       deceased  . CAD Sister   . CAD Brother 76       died from MI at age 69yo  . Hypertension Unknown    Social History:  Social History   Social History  . Marital status: Single    Spouse name: N/A  . Number of children: N/A  . Years of education: 12th grade   Occupational History  . Disability     2/2 schizophrenia   Social History Main Topics  . Smoking status: Former Smoker  Packs/day: 1.00    Years: 30.00    Types: Cigarettes    Quit date: 06/24/2001  . Smokeless tobacco: Never Used  . Alcohol use 0.0 oz/week     Comment: 12/17/2015 "stopped drinking 4-6 months ago"  . Drug use: Yes    Types: "Crack" cocaine     Comment: h/o cocaine abuse; last use about 2 months ago (10/01/16)  . Sexual activity: Yes   Other Topics Concern  . Not on file   Social History Narrative   Lives in [place] alone, has a [facility] aide that helps with medications 4-5 days a week with medications, helping to clean.    Review of Systems: A complete ROS was negative except as per HPI.  Review of Systems  Constitutional: Negative for chills, diaphoresis, fever and weight loss (weight gain).  HENT: Negative for ear pain and sinus pain.   Eyes: Negative for blurred vision, photophobia and redness.  Respiratory: Positive for cough and shortness of breath. Negative for hemoptysis, sputum production and wheezing.   Cardiovascular: Positive for orthopnea and leg swelling. Negative for chest pain and claudication.  Gastrointestinal: Negative for constipation, diarrhea, nausea and vomiting.  Genitourinary: Negative for dysuria, flank pain, frequency and urgency.  Musculoskeletal: Negative for myalgias.  Neurological: Positive for weakness. Negative for dizziness and headaches.  Psychiatric/Behavioral: Negative for depression. The patient is not nervous/anxious.      Physical Exam: Blood pressure (!) 150/94, pulse 82, temperature 98 F (36.7 C), temperature source Oral, resp. rate 20, height 6' (1.829 m), weight 299 lb 1.6 oz (135.7 kg), SpO2 98 %. Physical Exam  Constitutional: He appears well-developed and well-nourished. No distress.  HENT:  Head: Normocephalic and atraumatic.  Eyes: Conjunctivae and EOM are normal.  Neck: Normal range of motion. No JVD present.  Cardiovascular: Normal rate and regular rhythm.   No murmur heard. Pulmonary/Chest: Effort normal and breath sounds normal. No stridor. No respiratory distress. He has no wheezes.  Abdominal: Soft. Bowel sounds are normal. He exhibits distension. There is tenderness (generalized abdominal tenderness).  Musculoskeletal: He exhibits no edema.  Neurological: He is alert.  Skin: Skin is warm. He is not diaphoretic.  Psychiatric: He has a normal mood and affect.   EKG: personally reviewed my interpretation is no acute changes from prior studies except, possible left fascicular block,   CXR: personally reviewed my interpretation is N/A  Assessment & Plan by Problem: Active Problems:   HTN (hypertension)   CKD (chronic kidney disease) stage 2, GFR 60-89 ml/min   Schizophrenia (HCC)   Diabetes (HCC)   Acute on chronic combined systolic (congestive) and diastolic (congestive) heart failure [facility])  Assessment: Patient is a 61 year old male who presented with increasing shortness of breath and bilateral lower extremity pitting edema. Claims history of multiple hospitalizations secondary to shortness of breath as a result of exacerbation of his heart failure with reduced ejection fraction, his current presentation, history of present illness, and evaluation thus far, he is most likely suffering from acute CHF exacerbation. Included in the differential or MI, PE, pneumonia, GERD.    1. Acute Exacerbation of CHF: The patient presented with increasing shortness of breath, bilateral lower  extremity edema and the presence of chronic CHF and poor medication compliance -Patient on torsemide 20 mg 3 times a day at home, but taking as 60 mg once daily -place patient on IV furosemide 80 mg twice a day -will assess renal function and effectiveness of the diuretic in a.m. and adjust as necessary -patient  is on fluid restriction 1200 mL daily -patient's most recent echo in November 2017 indicated an LVEF 30-35% -repeat echo ordered pending -patient to be continued on his sacubitril-valsartan 97/103 mg per tablet BID -patient to continue on his spironolactone 25 mg daily  2. CKD stage II We'll monitor patient's CK-MB CMP initial renal function parameters during diuresis -BMP in a.m.  3. Schizophrenia Patient states he has a history of schizophrenia -Will continue patient's quetiapine 400 mg tablets daily  4. Diabetes Patient has a history of insulin dependent diabetes -patient placed on sliding scale insulin -and insulin glargine 30 units at bedtime -insulin aspart 6 units with meals  5. HTN/CAD Patient will continue his carvedilol 6.25 mg 2 times daily Furosemide 80 mg twice a day Patient will be continued on his rosuvastatin 20 mg daily  Diet:  DVT ppx:  Fluids: NO Code: Full Dispo: Admit patient to Inpatient with expected length of stay greater than 2 midnights.  Signed: Kathi Ludwig, MD 10/01/2016, 6:29 PM  Pager: Pager# [phone]

## 2016-10-02 ENCOUNTER — Inpatient Hospital Stay (HOSPITAL_COMMUNITY): Payer: Medicare Other

## 2016-10-02 ENCOUNTER — Encounter (HOSPITAL_COMMUNITY): Payer: Self-pay | Admitting: *Deleted

## 2016-10-02 DIAGNOSIS — I5030 Unspecified diastolic (congestive) heart failure: Secondary | ICD-10-CM

## 2016-10-02 DIAGNOSIS — Z87891 Personal history of nicotine dependence: Secondary | ICD-10-CM

## 2016-10-02 DIAGNOSIS — E785 Hyperlipidemia, unspecified: Secondary | ICD-10-CM

## 2016-10-02 DIAGNOSIS — I5043 Acute on chronic combined systolic (congestive) and diastolic (congestive) heart failure: Secondary | ICD-10-CM

## 2016-10-02 DIAGNOSIS — Z8673 Personal history of transient ischemic attack (TIA), and cerebral infarction without residual deficits: Secondary | ICD-10-CM

## 2016-10-02 LAB — CBC
HCT: 47.8 % (ref 39.0–52.0)
Hemoglobin: 15.4 g/dL (ref 13.0–17.0)
MCH: 29.6 pg (ref 26.0–34.0)
MCHC: 32.2 g/dL (ref 30.0–36.0)
MCV: 91.7 fL (ref 78.0–100.0)
PLATELETS: 164 10*3/uL (ref 150–400)
RBC: 5.21 MIL/uL (ref 4.22–5.81)
RDW: 13.9 % (ref 11.5–15.5)
WBC: 6.1 10*3/uL (ref 4.0–10.5)

## 2016-10-02 LAB — BASIC METABOLIC PANEL
Anion gap: 10 (ref 5–15)
BUN: 19 mg/dL (ref 6–20)
CALCIUM: 9.6 mg/dL (ref 8.9–10.3)
CHLORIDE: 102 mmol/L (ref 101–111)
CO2: 26 mmol/L (ref 22–32)
CREATININE: 1.01 mg/dL (ref 0.61–1.24)
GFR calc non Af Amer: >60 mL/min (ref >60)
Glucose, Bld: 176 mg/dL — ABNORMAL HIGH (ref 65–99)
Potassium: 5.1 mmol/L (ref 3.5–5.1)
SODIUM: 138 mmol/L (ref 135–145)

## 2016-10-02 LAB — ECHOCARDIOGRAM COMPLETE
Height: 72 in
WEIGHTICAEL: 4704 [oz_av]

## 2016-10-02 LAB — GLUCOSE, CAPILLARY
Glucose-Capillary: 188 mg/dL — ABNORMAL HIGH (ref 65–99)
Glucose-Capillary: 209 mg/dL — ABNORMAL HIGH (ref 65–99)
Glucose-Capillary: 267 mg/dL — ABNORMAL HIGH (ref 65–99)
Glucose-Capillary: 272 mg/dL — ABNORMAL HIGH (ref 65–99)

## 2016-10-02 LAB — HIV ANTIBODY (ROUTINE TESTING W REFLEX): HIV SCREEN 4TH GENERATION: NONREACTIVE

## 2016-10-02 MED ORDER — PNEUMOCOCCAL VAC POLYVALENT 25 MCG/0.5ML IJ INJ
0.5000 mL | INJECTION | INTRAMUSCULAR | Status: AC
Start: 2016-10-03 — End: 2016-10-03
  Administered 2016-10-03: 0.5 mL via INTRAMUSCULAR
  Filled 2016-10-02: qty 0.5

## 2016-10-02 MED ORDER — PERFLUTREN LIPID MICROSPHERE
1.0000 mL | INTRAVENOUS | Status: AC | PRN
  Administered 2016-10-02: 2 mL via INTRAVENOUS
  Filled 2016-10-02: qty 10

## 2016-10-02 NOTE — Evaluation (Signed)
Physical Therapy Evaluation Patient Details Name: [name] [name] MRN: [id number] DOB: 1955/09/10 Today's Date: 10/02/2016   History of Present Illness  JIOVANNY BURDELL is a 61 year old male with past medical history of chronic combined congestive heart failure, CK D stage II, hypertension, A. fib, schizophrenia, CVA, diabetes.  ADmitted with SOB due to CHF exacerbation.   Clinical Impression  Patient presents with continued SOB with ambulation, though feels it is improving.  He lives alone and will need continued support during hospital stay for mobilization to allow increased independence and safety for return home at d/c.  Encouraged to ask staff and walk with family when they visit.  PT to follow acutely.    Follow Up Recommendations No PT follow up    Equipment Recommendations  None recommended by PT    Recommendations for Other Services       Precautions / Restrictions Precautions Precautions: Fall      Mobility  Bed Mobility               General bed mobility comments: up in chair  Transfers Overall transfer level: Modified independent   Transfers: Sit to/from Stand Sit to Stand: Modified independent (Device/Increase time)         General transfer comment: UE support on armrests  Ambulation/Gait Ambulation/Gait assistance: Supervision;Min guard Ambulation Distance (Feet): 160 Feet Assistive device: None Gait Pattern/deviations: Step-through pattern;Decreased stride length;Wide base of support     General Gait Details: no LOB or significant physical help needed, lumbering gait with broad base, some SOB HR max 107, dyspnea 2/4.  Stairs            Wheelchair Mobility    Modified Rankin (Stroke Patients Only)       Balance             Standing balance-Leahy Scale: Good Standing balance comment: mild limitations due to posture and trunk stiffness, but no significant risk in open environment                              Pertinent Vitals/Pain Pain Assessment: No/denies pain    Home Living Family/patient expects to be discharged to:: Private residence Living Arrangements: Alone Available Help at Discharge: Family;Personal care attendant;Available PRN/intermittently Type of Home: Apartment Home Access: Level entry     Home Layout: One level Home Equipment: Cane - single point;Grab bars - tub/shower      Prior Function Level of Independence: Independent with assistive device(s)         Comments: uses cane at times, reports at times limited due to SOB; has aide 3 hours a day 7 days a week     Hand Dominance        Extremity/Trunk Assessment        Lower Extremity Assessment Lower Extremity Assessment: Overall WFL for tasks assessed    Cervical / Trunk Assessment Cervical / Trunk Assessment: Other exceptions Cervical / Trunk Exceptions: stiffness throughout trunk, pelvis and neck  Communication   Communication: No difficulties  Cognition Arousal/Alertness: Awake/alert Behavior During Therapy: WFL for tasks assessed/performed Overall Cognitive Status: Within Functional Limits for tasks assessed (but not formally assessed)                                        General Comments General comments (skin integrity, edema, etc.): Encouraged to ambulate with  staff and family as able throughout weekend    Exercises     Assessment/Plan    PT Assessment Patient needs continued PT services  PT Problem List Decreased mobility;Decreased balance;Decreased activity tolerance       PT Treatment Interventions Therapeutic activities;DME instruction;Gait training;Therapeutic exercise;Functional mobility training;Balance training    PT Goals (Current goals can be found in the Care Plan section)  Acute Rehab PT Goals Patient Stated Goal: To go home at least by Monday PT Goal Formulation: With patient Time For Goal Achievement: 10/09/16 Potential to Achieve Goals:  Good    Frequency Min 3X/week   Barriers to discharge        Co-evaluation               AM-PAC PT "6 Clicks" Daily Activity  Outcome Measure Difficulty turning over in bed (including adjusting bedclothes, sheets and blankets)?: A Little Difficulty moving from lying on back to sitting on the side of the bed? : A Lot Difficulty sitting down on and standing up from a chair with arms (e.g., wheelchair, bedside commode, etc,.)?: A Little Help needed moving to and from a bed to chair (including a wheelchair)?: A Little Help needed walking in hospital room?: A Little Help needed climbing 3-5 steps with a railing? : A Little 6 Click Score: 17    End of Session Equipment Utilized During Treatment: Gait belt Activity Tolerance: Patient tolerated treatment well Patient left: in chair;with call bell/phone within reach   PT Visit Diagnosis: Other abnormalities of gait and mobility (R26.89)    Time: 8159-4707 PT Time Calculation (min) (ACUTE ONLY): 18 min   Charges:   PT Evaluation $PT Eval Low Complexity: 1 Low     PT G CodesMagda Kiel, [place] [phone] 10/02/2016   Reginia Naas 10/02/2016, 3:06 PM

## 2016-10-02 NOTE — Consult Note (Addendum)
   [facility] Inpatient Consult   10/02/2016  KAIYDEN SIMKIN [birth year] [phone]   Chart reviewed for [facility] for possible restart of services. Chart review reveals  this 61 year old was admitted from the [facility] with shortness of breath with bilateral lower extrmetiy edema.  Came by to speak with the patient and he was in the mist of medical care. Came by and he is starting his lunch. This is the patient's first admission.  Will follow up as time permits.  Natividad Brood, RN BSN [facility] Liaison  [phone] business mobile phone Toll free office [phone]

## 2016-10-02 NOTE — Progress Notes (Addendum)
   Subjective: patient noticed some improvement today in his breathing this morning, he was sitting upright in his chair.  He was in good spirits, denied any pain and was able to speak in full sentences.    Objective:  Vital signs in last 24 hours: Vitals:   10/01/16 2158 10/02/16 0024 10/02/16 0526 10/02/16 0810  BP: 122/74 105/68 115/76 130/89  Pulse: 85 97 85 (!) 54  Resp:  20 20 (!) 22  Temp:  97.9 F (36.6 C) (!) 97.4 F (36.3 C)   TempSrc:  Oral Oral   SpO2:  96% 99% 95%  Weight:   294 lb (133.4 kg)   Height:       I/O last 3 completed shifts: In: 200 [P.O.:200] Out: 1300 [Urine:1300] Total I/O In: 270 [P.O.:270] Out: -   Physical Exam  Eyes: Right eye exhibits no discharge. Left eye exhibits no discharge.  Neck: JVD (very minimal above clavicle.) present.  Cardiovascular: Normal rate, regular rhythm and normal heart sounds.  Exam reveals no friction rub.   No murmur heard. Pulmonary/Chest: Effort normal and breath sounds normal. No respiratory distress. He has no wheezes. He has no rales.  Abdominal: Soft. Bowel sounds are normal. He exhibits no distension.  Musculoskeletal: He exhibits edema (1+ LE).    Assessment/Plan:  Active Problems:   HTN (hypertension)   CKD (chronic kidney disease) stage 2, GFR 60-89 ml/min   Schizophrenia (HCC)   Diabetes ([place])   Acute on chronic combined systolic (congestive) and diastolic (congestive) heart failure ([place])  1. Acute Exacerbation of CHF: patient's most recent echo in November 2017 indicated an LVEF 30-35% The patient presented with increasing shortness of breath, bilateral lower extremity edema and the presence of chronic CHF and poor medication compliance  -Patient on torsemide 20 mg 3 times a day at home, but taking as 60 mg once daily -Pt placed on IV lasix 80mg  BID here in hosptial. Other heart failure home meds continued -patient is on fluid restriction 1200 mL daily -repeat echo pending  10/02/16 -patient  reported some improvement in breathing this morning. He was net -1 L since last night. He has only received one dose of IV lasix 80mg  so far. -His dry weight at the beginning of this year seems to be around 275 lb he is 294 today down from 300lbs taken at the clinic.        2. CKD stage II: Pt on sacubitril valsartan as part of hear failure therapy.   Lab Results  Component Value Date   CREATININE 1.01 10/02/2016   CREATININE 1.18 10/01/2016   CREATININE 0.97 06/23/2016    -Creatinine lower this a.m. Post-diuresis see above.    3. Schizophrenia Patient states he has a history of schizophrenia -Will continue patient's quetiapine 400 mg tablets daily   4. Diabetes Patient has a history of insulin dependent diabetes -patient placed on sliding scale insulin -and insulin glargine 30 units at bedtime -insulin aspart 6 units with meals -Glucose 176 this am.      5. HTN/CAD Patient will continue his carvedilol 6.25 mg 2 times daily Furosemide 80 mg twice a day Patient will be continued on his rosuvastatin 20 mg daily   Dispo: Anticipated discharge in approximately 3 day(s).   Katherine Roan, MD 10/02/2016, 10:59 AM

## 2016-10-02 NOTE — Progress Notes (Signed)
  Echocardiogram 2D Echocardiogram has been performed.  [name] [name] 10/02/2016, 10:20 AM

## 2016-10-02 NOTE — Care Management Note (Signed)
Case Management Note  Patient Details  Name: [name] [name] MRN: [id number] Date of Birth: August 20, 1955  Subjective/Objective:   CHF                Action/Plan: Patient lives at home; Aldine Contes, MD; has private insurance with [facility] with prescription drug coverage; CM following for DCP.  Expected Discharge Date:   possibly 10/05/2016               Expected Discharge Plan:  Home/Self Care  In-House Referral:   [facility]  Discharge planning Services  CM Consult  Status of Service:  In process, will continue to follow  Sherrilyn Rist [phone] 10/02/2016, 4:23 PM

## 2016-10-03 ENCOUNTER — Encounter (HOSPITAL_COMMUNITY): Payer: Self-pay

## 2016-10-03 LAB — BASIC METABOLIC PANEL
Anion gap: 7 (ref 5–15)
BUN: 24 mg/dL — AB (ref 6–20)
CO2: 26 mmol/L (ref 22–32)
CREATININE: 1.37 mg/dL — AB (ref 0.61–1.24)
Calcium: 9.7 mg/dL (ref 8.9–10.3)
Chloride: 104 mmol/L (ref 101–111)
GFR calc Af Amer: >60 mL/min (ref >60)
GFR, EST NON AFRICAN AMERICAN: 54 mL/min — AB (ref >60)
GLUCOSE: 183 mg/dL — AB (ref 65–99)
Potassium: 4 mmol/L (ref 3.5–5.1)
SODIUM: 137 mmol/L (ref 135–145)

## 2016-10-03 LAB — GLUCOSE, CAPILLARY
GLUCOSE-CAPILLARY: 151 mg/dL — AB (ref 65–99)
Glucose-Capillary: 208 mg/dL — ABNORMAL HIGH (ref 65–99)
Glucose-Capillary: 209 mg/dL — ABNORMAL HIGH (ref 65–99)
Glucose-Capillary: 224 mg/dL — ABNORMAL HIGH (ref 65–99)

## 2016-10-03 MED ORDER — PREDNISOLONE ACETATE 1 % OP SUSP
1.0000 [drp] | Freq: Three times a day (TID) | OPHTHALMIC | Status: DC
  Administered 2016-10-03 – 2016-10-05 (×5): 1 [drp] via OPHTHALMIC
  Filled 2016-10-03: qty 1

## 2016-10-03 MED ORDER — KETOROLAC TROMETHAMINE 0.5 % OP SOLN
1.0000 [drp] | Freq: Four times a day (QID) | OPHTHALMIC | Status: DC
  Administered 2016-10-03 – 2016-10-05 (×6): 1 [drp] via OPHTHALMIC
  Filled 2016-10-03: qty 3

## 2016-10-03 NOTE — Progress Notes (Signed)
   Subjective:  Patient was feeling little better today. There was some improvement in his shortness of breath. He denies any chest pain.  Objective:  Vital signs in last 24 hours: Vitals:   10/02/16 1948 10/03/16 0435 10/03/16 0654 10/03/16 1120  BP: (!) 128/92  120/63 (!) 105/48  Pulse: (!) 113 73  78  Resp: 20 20    Temp: (!) 97.4 F (36.3 C) (!) 97.4 F (36.3 C)    TempSrc: Oral Oral    SpO2: 97% 95%  95%  Weight:  295 lb 8 oz (134 kg)    Height:       Gen.well-developed, well-nourished gentleman, in no acute distress. Chest. Clear bilaterally. CV. Regular rate and rhythm. Abdomen.soft, abdominal wall edema, non tender, bowel sounds positive. Extremities. 2+ pitting edema bilaterally up to below knees.  Assessment/Plan:  Acute on chronic CHF: His symptoms are improving.echo done yesterday shows worsening of his ejection fraction at 20-25%, previously H year ago it was 30-35%. Minimally elevated BNP. He net output is -2200 since admission. He is currently on Lasix 80 mg twice a day IV. He has a bump in his creatinine today from 1.01-1.37.weight is stable as compared to yesterday, decreased to 295 from his admission weight of 302. -continue Lasix 80 mg IV twice a day. -we will back off little bit of his creatinine continued to get worse.  CKD stage II: creatinine increased today to 1.37. -Keep monitoring-we will back off on his diuresis if continued to get worse.  Diabetes.  Current A1c done on 10/01/2016 was 7.3. -Continue glargine 30 units at bedtime. -Continue aspart 6 units with meals, along with moderate sliding scale.  HTN/CAD: Patient was normotensive this morning. -Continue home dose of Entresto, and carvedilol. -Continue rosuvastatin 20 mg daily.  Schizophrenia Patient states he has a history of schizophrenia -Will continue patient's quetiapine 400 mg tablets daily  Dispo: Anticipated discharge in approximately 2-3 day(s).   Lorella Nimrod, MD 10/03/2016, 4:19  PM Pager: [phone]

## 2016-10-03 NOTE — Progress Notes (Signed)
Medicine attending: I reviewed pertinent clinical and laboratory data with resident physician Dr. Lorella Nimrod and I concur with her evaluation and management plan which will be subsequently detailed in her daily progress note. 61 year old man admitted on September 6 with acute on chronic combined systolic and diastolic heart failure.  He inadvertently decreased his diuretic dose and gained about 25 pounds.  He is currently being diuresed with parenteral furosemide.  Negative fluid balance 2.1 L so far.  Weight 295.5 pounds down from 302 pounds on admission.  Slight deterioration in renal function today after initial improvement and we may need to back off on the diuretic dose at this point.  Of note, BNP only minimally increased on admission.  Cardiomegaly and early congestive failure on chest x-ray but most significant finding was significant decreased ejection fraction of 20-25% and grade 2 diastolic dysfunction on echocardiogram September 7.Marland Kitchen

## 2016-10-04 ENCOUNTER — Encounter (HOSPITAL_COMMUNITY): Payer: Self-pay | Admitting: *Deleted

## 2016-10-04 DIAGNOSIS — N182 Chronic kidney disease, stage 2 (mild): Secondary | ICD-10-CM

## 2016-10-04 DIAGNOSIS — Z79899 Other long term (current) drug therapy: Secondary | ICD-10-CM

## 2016-10-04 DIAGNOSIS — Z794 Long term (current) use of insulin: Secondary | ICD-10-CM

## 2016-10-04 DIAGNOSIS — I509 Heart failure, unspecified: Secondary | ICD-10-CM

## 2016-10-04 DIAGNOSIS — E1122 Type 2 diabetes mellitus with diabetic chronic kidney disease: Secondary | ICD-10-CM

## 2016-10-04 DIAGNOSIS — I130 Hypertensive heart and chronic kidney disease with heart failure and stage 1 through stage 4 chronic kidney disease, or unspecified chronic kidney disease: Principal | ICD-10-CM

## 2016-10-04 DIAGNOSIS — F209 Schizophrenia, unspecified: Secondary | ICD-10-CM

## 2016-10-04 DIAGNOSIS — I2510 Atherosclerotic heart disease of native coronary artery without angina pectoris: Secondary | ICD-10-CM

## 2016-10-04 LAB — GLUCOSE, CAPILLARY
GLUCOSE-CAPILLARY: 176 mg/dL — AB (ref 65–99)
GLUCOSE-CAPILLARY: 234 mg/dL — AB (ref 65–99)
GLUCOSE-CAPILLARY: 316 mg/dL — AB (ref 65–99)
GLUCOSE-CAPILLARY: 273 mg/dL — AB (ref 65–99)

## 2016-10-04 LAB — BASIC METABOLIC PANEL
Anion gap: 9 (ref 5–15)
BUN: 28 mg/dL — ABNORMAL HIGH (ref 6–20)
CALCIUM: 10.3 mg/dL (ref 8.9–10.3)
CO2: 28 mmol/L (ref 22–32)
CREATININE: 1.33 mg/dL — AB (ref 0.61–1.24)
Chloride: 100 mmol/L — ABNORMAL LOW (ref 101–111)
GFR calc Af Amer: >60 mL/min (ref >60)
GFR, EST NON AFRICAN AMERICAN: 56 mL/min — AB (ref >60)
GLUCOSE: 173 mg/dL — AB (ref 65–99)
POTASSIUM: 4.3 mmol/L (ref 3.5–5.1)
SODIUM: 137 mmol/L (ref 135–145)

## 2016-10-04 MED ORDER — SENNOSIDES-DOCUSATE SODIUM 8.6-50 MG PO TABS
2.0000 | ORAL_TABLET | Freq: Two times a day (BID) | ORAL | Status: DC
  Administered 2016-10-04 – 2016-10-05 (×2): 2 via ORAL
  Filled 2016-10-04 (×2): qty 2

## 2016-10-04 MED ORDER — TORSEMIDE 20 MG PO TABS
60.0000 mg | ORAL_TABLET | Freq: Every day | ORAL | Status: DC
  Administered 2016-10-05: 60 mg via ORAL
  Filled 2016-10-04: qty 3

## 2016-10-04 NOTE — Progress Notes (Signed)
MD paged about patient not having a bowel movement for 5 days. Sennakot ordered.

## 2016-10-04 NOTE — Progress Notes (Signed)
Medicine attending: I examined this patient today together with resident physician Dr. Lorella Nimrod and I concur with her evaluation and management plan which we discussed together. He continues to improve.  Denies dyspnea at rest.  Lungs overall clear to auscultation and resonant to percussion.  Regular cardiac rhythm no murmur.  No JVD.  Decreased peripheral edema now 1+. Diuresing about 1 L daily.  -3.35 L since admission. Weight 296.  Modest improvement from 302 on admission. We will add back oral torsemide and discontinue parenteral diuretics in view of rising BUN and creatinine.

## 2016-10-04 NOTE — Progress Notes (Signed)
   Subjective: patient was feeling better, stating that his breathing is improving.  Objective:  Vital signs in last 24 hours: Vitals:   10/03/16 0654 10/03/16 1120 10/03/16 1956 10/04/16 0524  BP: 120/63 (!) 105/48 108/71 113/80  Pulse:  78 71 82  Resp:   18 16  Temp:   98.1 F (36.7 C) 97.9 F (36.6 C)  TempSrc:   Oral Oral  SpO2:  95% 93% 98%  Weight:    295 lb 12.8 oz (134.2 kg)  Height:       Gen.well-developed, well-nourished gentleman,sitting comfortably in chair, in no acute distress. Chest. Clear bilaterally. CV. Regular rate and rhythm. Abdomen.soft, abdominal wall edema seems improving, non tender, bowel sounds positive. Extremities. 1+ pitting edema bilaterally up to below knees.  Assessment/Plan:  Acute on chronic CHF: His symptoms are improving. We will restart him on his home dose of torsemide 60 mg daily. We will watch him another day. -Torsemide 60 mg daily from tomorrow.  CKD stage GQ:[id number] remained stable at 1.33 today, still elevated above his baseline. We are giving him 1 dose of Lasix 80 mg IV today and then changing him to torsemide 60 mg from tomorrow. -Repeat BMP in the morning.  HTN/CAD: Patient was normotensive this morning. -Continue home dose of Entresto, and carvedilol. -Continue rosuvastatin 20 mg daily.  Diabetes.  Current A1c done on 10/01/2016 was 7.3. -Continue glargine 30 units at bedtime. -Continue aspart 6 units with meals, along with moderate sliding scale.  Schizophrenia Patient states he has a history of schizophrenia -Will continue patient's quetiapine 400 mg tablets daily.  Dispo: Anticipated discharge in approximately 1-2 day(s).   Lorella Nimrod, MD 10/04/2016, 12:13 PM Pager: [phone]

## 2016-10-05 ENCOUNTER — Telehealth: Payer: Self-pay

## 2016-10-05 ENCOUNTER — Telehealth: Payer: Self-pay | Admitting: Internal Medicine

## 2016-10-05 DIAGNOSIS — Z23 Encounter for immunization: Secondary | ICD-10-CM | POA: Diagnosis not present

## 2016-10-05 LAB — BASIC METABOLIC PANEL
Anion gap: 5 (ref 5–15)
BUN: 25 mg/dL — AB (ref 6–20)
CO2: 30 mmol/L (ref 22–32)
CREATININE: 1.25 mg/dL — AB (ref 0.61–1.24)
Calcium: 10.1 mg/dL (ref 8.9–10.3)
Chloride: 102 mmol/L (ref 101–111)
GFR calc Af Amer: >60 mL/min (ref >60)
GLUCOSE: 168 mg/dL — AB (ref 65–99)
Potassium: 4.2 mmol/L (ref 3.5–5.1)
Sodium: 137 mmol/L (ref 135–145)

## 2016-10-05 LAB — GLUCOSE, CAPILLARY
Glucose-Capillary: 210 mg/dL — ABNORMAL HIGH (ref 65–99)
Glucose-Capillary: 351 mg/dL — ABNORMAL HIGH (ref 65–99)

## 2016-10-05 MED ORDER — TORSEMIDE 20 MG PO TABS: ORAL_TABLET | ORAL | 0 refills | Status: DC

## 2016-10-05 NOTE — Progress Notes (Signed)
   Subjective: [name] [name] was sitting up in his chair today upon entering the room. He stated that he slept well overnight. Denied SOB, orthopnea, nausea, vomiting, diarrhea, chest pain, abdominal pain, headache, increased swelling of his legs. Patient stated that he is much better, watched football yesterday and would be fine with being discharged home if all goes well.   Objective:  Vital signs in last 24 hours: Vitals:   10/04/16 0524 10/04/16 1230 10/04/16 2036 10/05/16 0616  BP: 113/80 (!) 103/57 (!) 138/98 108/82  Pulse: 82 (!) 58 78 73  Resp: 16 18 18 18   Temp: 97.9 F (36.6 C) 98.2 F (36.8 C) 98.1 F (36.7 C) (!) 97.5 F (36.4 C)  TempSrc: Oral Oral Oral Oral  SpO2: 98% 100% 96% 100%  Weight: 295 lb 12.8 oz (134.2 kg)   295 lb 11.2 oz (134.1 kg)  Height:       ROS negative except as per HPI.  Physical Exam  Constitutional: He is oriented to person, place, and time. He appears well-developed and well-nourished. No distress.  HENT:  Head: Normocephalic and atraumatic.  Neck: Normal range of motion.  Cardiovascular: Normal rate.   No murmur heard. Pulmonary/Chest: Effort normal and breath sounds normal. No stridor. No respiratory distress. He has no wheezes.  Abdominal: Soft. Bowel sounds are normal. He exhibits no distension. There is no tenderness.  Musculoskeletal: He exhibits edema. He exhibits no tenderness.  Neurological: He is alert and oriented to person, place, and time.  Skin: Skin is warm. Capillary refill takes less than 2 seconds. He is not diaphoretic.  Psychiatric: He has a normal mood and affect.   Assessment/Plan:  Principal Problem:   Acute on chronic combined systolic (congestive) and diastolic (congestive) heart failure (HCC) Active Problems:   HTN (hypertension)   CKD (chronic kidney disease) stage 2, GFR 60-89 ml/min   Schizophrenia (HCC)   Diabetes ([place])  Acute on chronic CHF: His symptoms are improving. We will restart him on his home  dose of torsemide 60 mg daily. We will watch him another day. -Torsemide 60 mg daily from today.  CKD stage II: Creatinine remained stable at 1.25 today, still elevated above his baseline. Changing him to torsemide 60 mg today.  HTN/CAD: Patient was again normotensive this morning. -Continue home dose of Entresto, and carvedilol. -Continue rosuvastatin20 mg daily.  Diabetes. Current A1c done on 10/01/2016 was 7.3. -Continue glargine 30 units at bedtime. -Continue aspart 6 units with meals, along with moderate sliding scale.  Schizophrenia Patient states he has a history of schizophrenia -Will continue patient's quetiapine 400 mg tablets daily.  Diet: Heart healthy DVT ppx: Home Xarelto Fluids: NO Code: Full Dispo: Anticipated discharge in approximately 1-2 day(s).   Kathi Ludwig, MD 10/05/2016, 7:22 AM Pager: Pager# [phone]

## 2016-10-05 NOTE — Progress Notes (Signed)
Centralized Telemetry called stating pt was having ventricular bigeminy. This RN observed same rhythm on tele monitor, non sustained. Pt resting w/ eyes closed, still sitting up in chair per preference. No distresses noted. On call MD Kennon Holter was paged and notified at this time regarding event.

## 2016-10-05 NOTE — Telephone Encounter (Signed)
   Reason for call:   I received a call from [name] at 9:30 PM inquiring about clarification of his insulin dose.    Pertinent Data:   He was discharged from the hospital today and received his last dose of insulin in the hospital with his lunch. When he checked his blood sugar after dinner he found that it was 380 and he was unsure of how much insulin to use.   Review of the hospital discharge summary shows that he is advised to resume his home insulin regiment Lantus 50 qHs with Novolog 15 units TID. At his last office visit his PCP had noted a low A1c, he had concerns for hypoglycemia and adjusted to this dosing appropriately.   Pt denies history of symptomatic hypoglycemia, he has been admitted for HHS in the past.    Assessment / Plan / Recommendations:   Advised to take Lantus 50 units this evening and resume novolog 15 units with meals tomorrow ( this is his home prescription )   Counseled on the symptoms of hypoglycemia and actions to take if he finds his blood sugar low  Patient has follow up scheduled 9/17 in the internal medicine outpatient clinic  As always, pt is advised that if new symptoms arise, they should go to an urgent care facility or to to ER for further evaluation.   [name] Noss, MD   10/05/2016, 9:40 PM

## 2016-10-05 NOTE — Discharge Instructions (Signed)

## 2016-10-05 NOTE — Telephone Encounter (Signed)
Hospital TOC per Dr Reesa Chew, discharge 10/05/2016, appt 10/12/2016 @ 10:45.

## 2016-10-05 NOTE — Progress Notes (Signed)
Heart Failure Navigator Consult Note  Presentation: per Dr. Berline Lopes: [name] [name] is a 61 year old male who presented to the Internal medicine clinic with a month long history of shortness of breath and bilateral lower extremity edema which acutely worsened over the past 2 days. He has a past medical history consistent for chronic kidney disease stage II, hypertension, paroxysmal atrial fibrillation, schizophrenia, CVA, gastric ulcer with hemorrhage, HFrEF, and diabetes. He states that approximately 2 days prior he developed severe shortness of breath which worsened to the extent that he can no longer tolerate it. He states that typically he is able to walk approximately 100 yards without severe shortness of breath but at this time he is unable to walk more than 15-20 yards before he takes a break. In the past when this would begin to occur he would take his diuretic at a slightly higher dose and this would resolve over a few days. Patient stated that in addition to his shortness of breath, he has had a chronic nonproductive cough. The bilateral lower extremity pitting edema is an ongoing issue that has worsened over the last 2-3 weeks. Patient stated that he has been taking his Torsemide 20mg  TID as a 60mg  once daily dosing. He stated that he is compliant the the remainder of his medications.    Past Medical History:  Diagnosis Date  . Atrial fibrillation ([place])   . Cancer [facility])    Prostate cancer-bx. 3 weeks ago  . Cataract   . Chronic combined systolic and diastolic CHF (congestive heart failure) (HCC)    a) EF 40-45% per 2D echo (02/2012) with grade 1 DD b)  NICM c) RHC (04/2012): RA: 4, RV 45/3/4, PA 42/9 (24), PCWP 14, Fick CO/CI: 5.2 /2.2, PVR 1.9 WU, PA 62% and 64% d) ECHO (10/2012) EF 40-45%, grade II DD, RV nl  . CKD (chronic kidney disease) stage 2, GFR 60-89 ml/min    BL SCr approximately 1-1.3  . Colitis 05/2009   History of colitis of ascending colon noted on CT abd/pelvis  (05/2009), with interval resolution with subsequent CT  . Continuous chronic alcoholism ([place])   . Degenerative lumbar spinal stenosis    s/p L2-3, L3-4, L4-5 laminectomy partial facetectomy, and bilateral foraminotomy  . Diabetes mellitus without complication ([place])    Type II  . Dysrhythmia    A. Fib  . Family history of adverse reaction to anesthesia    "sister, can't go to sleep"  . GERD (gastroesophageal reflux disease)   . H/O cocaine abuse   . Hepatic steatosis    suspected 2/2 alcohol abuse  . Hepatitis C   . History of pancreatitis 01/2011   Admission for acute pancreatitis presumed 2/2 ongoing alcohol abuse- and hypertriglyceridemia  . History of pneumonia   . HIT (heparin-induced thrombocytopenia) ([place])   . Hyperlipidemia   . Hypertension    uncontrolled with medication noncompliance  . Hypertriglyceridemia   . NICM (nonischemic cardiomyopathy) ([place])    a. LHC (04/2012): nl arteries  . PFO (patent foramen ovale) 01/2012   with right to left shunt, noted per TEE in evaluation for source of embolic stroke in 03/8099  . Rhabdomyolysis 02/22/2012   H/O rhabdomyolysis in 01/2012 that was idiopathic, cause never identified  . Schizophrenia, schizo-affective ([place])   . Shortness of breath dyspnea   . Splenic cyst   . Stroke ([place]) 01/2012   Small cerebellar infarcts right greater than left as well as questionable acute left external capsule and caudate nuclear punctate  lacunar infarcts noted per MRI (01/2012) - presumed to be embolic likely source PFO with right to left shunt (noted per TEE 01/ 2014)    Social History   Social History  . Marital status: Single    Spouse name: N/A  . Number of children: N/A  . Years of education: 12th grade   Occupational History  . Disability     2/2 schizophrenia   Social History Main Topics  . Smoking status: Former Smoker    Packs/day: 1.00    Years: 30.00    Types: Cigarettes    Quit date: 06/24/2001  . Smokeless tobacco: Never  Used  . Alcohol use 0.0 oz/week     Comment: 12/17/2015 "stopped drinking 4-6 months ago"  . Drug use: Yes    Types: "Crack" cocaine     Comment: h/o cocaine abuse; last use about 2 months ago (10/01/16)  . Sexual activity: Yes   Other Topics Concern  . None   Social History Narrative   Lives in [place] alone, has a [place] aide that helps with medications 4-5 days a week with medications, helping to clean.    ECHO:Study Conclusions--10/02/16  - Left ventricle: Wall thickness was increased in a pattern of mild   LVH. Systolic function was severely reduced. The estimated   ejection fraction was in the range of 20% to 25%. Features are   consistent with a pseudonormal left ventricular filling pattern,   with concomitant abnormal relaxation and increased filling   pressure (grade 2 diastolic dysfunction).  ------------------------------------------------------------------- Study data:  Comparison was made to the study of 12/26/2015.  Study status:  Routine.  Procedure:  The patient reported no pain pre or post test. Transthoracic echocardiography. Image quality was poor. The study was technically difficult, as a result of poor patient compliance, restricted patient mobility, and body habitus. Intravenous contrast (Definity) was administered.  Study completion:  There were no complications.          Transthoracic echocardiography.  M-mode, complete 2D, spectral Doppler, and color Doppler.  Birthdate:  Patient birthdate: 1955-07-07.  Age:  Patient is 61 yr old.  Sex:  Gender: male.    BMI: 39.9 kg/m^2.  Blood pressure:     115/76  Patient status:  Inpatient.  Study date: Study date: 10/02/2016. Study time: 09:10 AM.  Location:  Bedside.  BNP    Component Value Date/Time   BNP 119.8 (H) 10/01/2016 1850   BNP 107.2 (H) 07/10/2015 1255    ProBNP    Component Value Date/Time   PROBNP 392.5 (H) 09/20/2013 2200     Education Assessment and Provision:  Detailed education and  instructions provided on heart failure disease management including the following:  Signs and symptoms of Heart Failure When to call the physician Importance of daily weights Low sodium diet Fluid restriction Medication management Anticipated future follow-up appointments  Patient education given on each of the above topics.  Patient acknowledges understanding and acceptance of all instructions.  I spoke with Mr. Kesinger regarding his HF and current hospitalization.  He tells me that he has an aid that helps him at home and does most of his cooking.  She avoids sodium and he does not add salt to any foods.  He does have a scale and tells me that he weighs each day.  I reviewed the importance of daily weights and when to contact the physician.  He denies any issues with getting or taking prescribed medications. He will follow in the AHF Clinic  after discharge.  I have scheduled a follow-up appt and he is aware.  I will also make a referral to the HF [employer] [facility].    Education Materials:  "Living Better With Heart Failure" Booklet, Daily Weight Tracker Tool    High Risk Criteria for Readmission and/or Poor Patient Outcomes:   EF <30%- yes 20-25% with grade 2 dias dys  2 or more admissions in 6 months-No 1/67mo  Difficult social situation- No   Demonstrates medication noncompliance- No denies  Barriers of Care:  Knowledge and compliance  Discharge Planning:   He plans to return to home alone.  He may benefit from and I will refer him for HF [employer] for ongoing education and symptom recognition.

## 2016-10-05 NOTE — Care Management Important Message (Signed)
Important Message  Patient Details  Name: [name] [name] MRN: [id number] Date of Birth: [birth year]   Medicare Important Message Given:  Yes    Orbie Pyo 10/05/2016, 2:24 PM

## 2016-10-05 NOTE — Progress Notes (Signed)
Internal Medicine Attending:   I saw and examined the patient. I reviewed the resident's note and I agree with the resident's findings and plan as documented in the resident's note. Patient's SOB has resolved competely. Moving around hosptial without any dyspnea.  For stay he is net net 5L negative and wight is down ~5 lbs.  His BUN and creatinine had trended up slightly and was changed to back to oral diruestics for this morning.  He reports he is feeling well and wants to go home.  He is established with [facility].  We will make sure [facility] is also involved and HF is enlisting paramedicine program.  We will try to get a close follow up appointment with Dr Missy Sabins as he is at high risk for bounce back.  Given he developed volume overload with his once daily dosing of torsemide I would like him to at least maintain BID dosing until he sees AHF clinic.  Exam: Obese male sitting up in chair, lungs grossly CTAB, 1+ bilateral lower tibial edema.

## 2016-10-05 NOTE — Progress Notes (Signed)
PT Cancellation Note  Patient Details Name: [name] [name] MRN: [id number] DOB: April 19, 1955   Cancelled Treatment:    Reason Eval/Treat Not Completed: Fatigue/lethargy limiting ability to participate. Pt states he is fatigued from previous walk with cardiac mobility.  Will follow up as appropriate.    Breslin Hemann 10/05/2016, 10:16 AM

## 2016-10-05 NOTE — Discharge Summary (Signed)
Name: [name] [name] MRN: [id number] DOB: [birth year] 61 y.o. PCP: [name] Contes, MD  Date of Admission: 10/01/2016  4:14 PM Date of Discharge: 10/05/2016 Attending Physician: No att. providers found  Discharge Diagnosis: Principal Problem:   Acute on chronic combined systolic (congestive) and diastolic (congestive) heart failure ([place]) Active Problems:   HTN (hypertension)   CKD (chronic kidney disease) stage 2, GFR 60-89 ml/min   Schizophrenia ([place])   Diabetes ([place])   Discharge Medications: Allergies as of 10/05/2016      Reactions   Heparin Other (See Comments)   Documented HIT under problem list Pt states he's not allergic. " They told me not take it anymore"   Thorazine [chlorpromazine Hcl] Other (See Comments)   Body freezes up      Medication List    TAKE these medications   carvedilol 6.25 MG tablet Commonly known as:  COREG take 1 tablet by mouth twice a day with meals What changed:  See the new instructions.   ENTRESTO 97-103 MG Generic drug:  sacubitril-valsartan take 1 tablet by mouth twice a day   insulin lispro 100 UNIT/ML KiwkPen Commonly known as:  HUMALOG Inject 0.15 mLs (15 Units total) into the skin 3 (three) times daily before meals. What changed:  how much to take   LANTUS SOLOSTAR 100 UNIT/ML Solostar Pen Generic drug:  Insulin Glargine Inject 50 Units into the skin daily at 10 pm.   metFORMIN 1000 MG tablet Commonly known as:  GLUCOPHAGE Take 1 tablet (1,000 mg total) by mouth 2 (two) times daily with a meal.   ofloxacin 0.3 % ophthalmic solution Commonly known as:  OCUFLOX Place 1 drop into the left eye 4 (four) times daily.   ONE TOUCH ULTRA MINI w/Device Kit 1 each by Does not apply route as needed.   oxyCODONE-acetaminophen 10-325 MG tablet Commonly known as:  PERCOCET Take 1 tablet by mouth every 4 (four) hours as needed for pain.   prednisoLONE acetate 1 % ophthalmic suspension Commonly known as:  PRED FORTE Place 1  drop into the left eye 4 (four) times daily.   QUEtiapine 400 MG tablet Commonly known as:  SEROQUEL Take 400 mg by mouth at bedtime.   rivaroxaban 20 MG Tabs tablet Commonly known as:  XARELTO Take 1 tablet (20 mg total) by mouth daily.   rosuvastatin 20 MG tablet Commonly known as:  CRESTOR Take 1 tablet (20 mg total) by mouth daily.   sitaGLIPtin 100 MG tablet Commonly known as:  JANUVIA Take 1 tablet (100 mg total) by mouth daily.   spironolactone 25 MG tablet Commonly known as:  ALDACTONE Take 1 tablet (25 mg total) by mouth daily.   torsemide 20 MG tablet Commonly known as:  DEMADEX Please take two tablets (34m) every morning and one tablet (247m mid afternoon at least 6 hours apart. What changed:  See the new instructions.   TRAVATAN Z 0.004 % Soln ophthalmic solution Generic drug:  Travoprost (BAK Free) Place 1 drop into the left eye 4 (four) times daily.   XIIDRA 5 % Soln Generic drug:  Lifitegrast Place 1 drop into the left eye 4 (four) times daily.            Discharge Care Instructions        Start     Ordered   10/05/16 0000  torsemide (DEMADEX) 20 MG tablet     10/05/16 1159   10/05/16 0000  Increase activity slowly     10/05/16 1159  10/05/16 0000  Diet - low sodium heart healthy     10/05/16 1159   10/05/16 0000  (HEART FAILURE PATIENTS) Call MD:  Anytime you have any of the following symptoms: 1) 3 pound weight gain in 24 hours or 5 pounds in 1 week 2) shortness of breath, with or without a dry hacking cough 3) swelling in the hands, feet or stomach 4) if you have to sleep on extra pillows at night in order to breathe.     10/05/16 1159      Disposition and follow-up:   [name] was discharged from [facility] in Stable condition.  At the hospital follow up visit please address:  1.  Please follow-up with the patient regarding the diuretic use and proper medication adherence. Follow-up with CHF clinic  recommendations   2.  Labs / imaging needed at time of follow-up: BMET for Creatinine clearance  3.  Pending labs/ test needing follow-up: n/a  Follow-up Appointments: Follow-up Information    [facility] Follow up on 10/13/2016.   Specialty:  Cardiology Why:  at 0900 in the Advanced Heart Failure Clinic--bring all medications to appt.  Gate code [phone] for Sept Contact information: [id number] [place] Hospital Course by problem list: Principal Problem:   Acute on chronic combined systolic (congestive) and diastolic (congestive) heart failure (H[facility]) Active Problems:   HTN (hypertension)   CKD (chronic kidney disease) stage 2, GFR 60-89 ml/min   Schizophrenia ([place])   Diabetes ([place])   1. Acute on Chronic combined systolic and diastolic heart failure Patient presented with shortness of breath with minimal exertion for 1 month which acutely worsened in the 2 days prior to admission to the extent that he could no longer ambulate to the restroom without diffculty. Patient was admitted for diuresis with IV Lasix and discontinuation of his torsemide 60 mg. Patient diuresed well with a loss of approximately 5 L's over 4 days. Patient denies shortness of breath on exertion, was able to ambulate to the restroom without difficulty, was no longer experiencing orthopnea, and sat comfortably in his chair while finishing breakfast. As such it was determined that he was stable for discharge home on day 4. He was discharged on torsemide at 40 mg in the a.m. and 20 mg mid-afternoon for a daily total to remain at 43m daily, with instruction to take the medication as prescribed being sure no not to miss additional doses. Patient was advised to follow-up with CHF clinic and his PCP as an outpatient. Patient underwent approximately 8-lbs of weight loss during this admission.  2. HTN Patient was  placed on his home dose of spironolactone, carvedilol, and Lasix 80 mg IV twice a day. His pressures remained within normal limits during his stay. He was discharged home on his previous antihypertensive medication regimen.   3. CKD The patient's creatinine increased during the second day of his stay, most likely secondary to the increased diuretic use. Creatinine was 1.18 on admission, peaking at 1.37 during day three before returning to 1.25 on discharge. Please follow-up on this at his next outpatient visit. Patient was discharged home on Torsemide 659mdaily with 4037mm and 73m39md afternoon. Patient stated that he understood this regimen and was in agreement with this plan. Please evaluate weight and signs of SOB on his next visit.   4. Diabetes Patient was  continued on glargine 30 units at bedtime although his home dose is 50. He was discharged back on his home dose of glargine. In addition, the patient was placed on insulin aspart 6 units with meals and a moderate sliding scale insulin regimen. His blood glucose measurements ranged into the upper 300's and mid 200's during his stay.   5. Schizophrenia  Patient remained asymptomatic with regard to his schizophrenia on admission. He was placed on his usual does of quetiapine 464m tablets daily.  Patient was advised to contact his PCP, or return to the clinic, if he develops shortness of breath, weight gain, chest pain, dyspnea on exertion, worsening orthopnea, or other symptoms concerning to the patient.  Discharge Vitals:   BP 119/74 (BP Location: Left Arm)   Pulse 68   Temp 98.3 F (36.8 C) (Oral)   Resp 20   Ht 6' (1.829 m)   Wt 295 lb 11.2 oz (134.1 kg)   SpO2 99%   BMI 40.10 kg/m   Pertinent Labs, Studies, and Procedures:  BMP Latest Ref Rng & Units 10/05/2016 10/04/2016 10/03/2016  Glucose 65 - 99 mg/dL 168(H) 173(H) 183(H)  BUN 6 - 20 mg/dL 25(H) 28(H) 24(H)  Creatinine 0.61 - 1.24 mg/dL 1.25(H) 1.33(H) 1.37(H)  BUN/Creat  Ratio 10 - 24 - - -  Sodium 135 - 145 mmol/L 137 137 137  Potassium 3.5 - 5.1 mmol/L 4.2 4.3 4.0  Chloride 101 - 111 mmol/L 102 100(L) 104  CO2 22 - 32 mmol/L _0 Calcium 8.9 - 10.3 mg/dL 10.1 10.3 9.7   CBC Latest Ref Rng & Units 10/02/2016 10/01/2016 12/24/2015  WBC 4.0 - 10.5 K/uL 6.1 5.8 5.2  Hemoglobin 13.0 - 17.0 g/dL 15.4 16.0 13.0  Hematocrit 39.0 - 52.0 % 47.8 49.8 37.9(L)  Platelets 150 - 400 K/uL 164 168 165    Discharge Instructions: Discharge Instructions    (HEART FAILURE PATIENTS) Call MD:  Anytime you have any of the following symptoms: 1) 3 pound weight gain in 24 hours or 5 pounds in 1 week 2) shortness of breath, with or without a dry hacking cough 3) swelling in the hands, feet or stomach 4) if you have to sleep on extra pillows at night in order to breathe.    Complete by:  As directed    Diet - low sodium heart healthy    Complete by:  As directed    Increase activity slowly    Complete by:  As directed       Signed: HKathi Ludwig MD 10/05/2016, 3:05 PM   Pager: Pager# [phone]

## 2016-10-07 NOTE — Telephone Encounter (Signed)
vmail full, cannot leave message

## 2016-10-08 ENCOUNTER — Telehealth (HOSPITAL_COMMUNITY): Payer: Self-pay

## 2016-10-08 NOTE — Addendum Note (Signed)
Addended by: Gilles Chiquito B on: 10/08/2016 04:50 PM   Modules accepted: Level of Service

## 2016-10-08 NOTE — Telephone Encounter (Signed)
Called pt to let him know I was on the way to visit him and he said that he wouldn't be able to participate in the program due to him being so busy and multiple appointments and he was refusing our services.

## 2016-10-08 NOTE — Progress Notes (Signed)
Internal Medicine Clinic Attending  Case discussed with Dr. Svalina  at the time of the visit.  We reviewed the resident's history and exam and pertinent patient test results.  I agree with the assessment, diagnosis, and plan of care documented in the resident's note.  

## 2016-10-12 ENCOUNTER — Ambulatory Visit: Payer: Self-pay

## 2016-10-13 ENCOUNTER — Encounter: Payer: Self-pay | Admitting: Internal Medicine

## 2016-10-13 ENCOUNTER — Encounter (HOSPITAL_COMMUNITY): Payer: Self-pay

## 2016-10-20 ENCOUNTER — Ambulatory Visit (HOSPITAL_COMMUNITY)
Admission: RE | Admit: 2016-10-20 | Discharge: 2016-10-20 | Disposition: A | Discharge status: Home / Self Care | Payer: Medicare Other | Source: Ambulatory Visit | Attending: Cardiology | Admitting: Cardiology

## 2016-10-20 VITALS — BP 136/98 | HR 78 | Wt 306.4 lb

## 2016-10-20 DIAGNOSIS — Z9114 Patient's other noncompliance with medication regimen: Secondary | ICD-10-CM | POA: Diagnosis not present

## 2016-10-20 DIAGNOSIS — N182 Chronic kidney disease, stage 2 (mild): Secondary | ICD-10-CM | POA: Insufficient documentation

## 2016-10-20 DIAGNOSIS — E1122 Type 2 diabetes mellitus with diabetic chronic kidney disease: Secondary | ICD-10-CM | POA: Diagnosis not present

## 2016-10-20 DIAGNOSIS — I5042 Chronic combined systolic (congestive) and diastolic (congestive) heart failure: Secondary | ICD-10-CM | POA: Diagnosis not present

## 2016-10-20 DIAGNOSIS — F1010 Alcohol abuse, uncomplicated: Secondary | ICD-10-CM

## 2016-10-20 DIAGNOSIS — F209 Schizophrenia, unspecified: Secondary | ICD-10-CM | POA: Diagnosis not present

## 2016-10-20 DIAGNOSIS — I5020 Unspecified systolic (congestive) heart failure: Secondary | ICD-10-CM | POA: Diagnosis not present

## 2016-10-20 DIAGNOSIS — I10 Essential (primary) hypertension: Secondary | ICD-10-CM

## 2016-10-20 DIAGNOSIS — Z794 Long term (current) use of insulin: Secondary | ICD-10-CM | POA: Insufficient documentation

## 2016-10-20 DIAGNOSIS — Z8673 Personal history of transient ischemic attack (TIA), and cerebral infarction without residual deficits: Secondary | ICD-10-CM | POA: Insufficient documentation

## 2016-10-20 DIAGNOSIS — Z7901 Long term (current) use of anticoagulants: Secondary | ICD-10-CM | POA: Diagnosis not present

## 2016-10-20 DIAGNOSIS — Z79899 Other long term (current) drug therapy: Secondary | ICD-10-CM | POA: Insufficient documentation

## 2016-10-20 DIAGNOSIS — I480 Paroxysmal atrial fibrillation: Secondary | ICD-10-CM | POA: Diagnosis not present

## 2016-10-20 DIAGNOSIS — Z79891 Long term (current) use of opiate analgesic: Secondary | ICD-10-CM | POA: Diagnosis not present

## 2016-10-20 DIAGNOSIS — Z8546 Personal history of malignant neoplasm of prostate: Secondary | ICD-10-CM | POA: Diagnosis not present

## 2016-10-20 DIAGNOSIS — I130 Hypertensive heart and chronic kidney disease with heart failure and stage 1 through stage 4 chronic kidney disease, or unspecified chronic kidney disease: Secondary | ICD-10-CM | POA: Insufficient documentation

## 2016-10-20 DIAGNOSIS — E1136 Type 2 diabetes mellitus with diabetic cataract: Secondary | ICD-10-CM

## 2016-10-20 MED ORDER — ROSUVASTATIN CALCIUM 20 MG PO TABS: 20.0000 mg | ORAL_TABLET | Freq: Every day | ORAL | 1 refills | Status: DC

## 2016-10-20 MED ORDER — TORSEMIDE 20 MG PO TABS: ORAL_TABLET | ORAL | 6 refills | Status: DC

## 2016-10-20 MED ORDER — SPIRONOLACTONE 25 MG PO TABS: 12.5000 mg | ORAL_TABLET | Freq: Every day | ORAL | 3 refills | Status: DC

## 2016-10-20 MED ORDER — SACUBITRIL-VALSARTAN 97-103 MG PO TABS: 1.0000 | ORAL_TABLET | Freq: Two times a day (BID) | ORAL | 6 refills | Status: DC

## 2016-10-20 NOTE — Progress Notes (Signed)
Advanced Heart Failure Medication Review by a Pharmacist  Does the patient  feel that his/her medications are working for him/her?  yes  Has the patient been experiencing any side effects to the medications prescribed?  no  Does the patient measure his/her own blood pressure or blood glucose at home?  yes   Does the patient have any problems obtaining medications due to transportation or finances?   No - all meds $0  Understanding of regimen: good Understanding of indications: good Potential of compliance: good Patient understands to avoid NSAIDs. Patient understands to avoid decongestants.  Issues to address at subsequent visits: None   Pharmacist comments: [name] [name] is a pleasant 61 yo M presenting with his medication bottles. He reports good compliance with his regimen. I did notice that he is only taking torsemide 40 mg once daily instead of 40/20 and he is no longer taking spironolactone. He states that he thought this was what he was discharged on. He did not have any other medication-related questions or concerns for me at this time.   Ruta Hinds. Velva Harman, PharmD, BCPS, CPP Clinical Pharmacist Pager: [phone] Phone: [phone] 10/20/2016 10:39 AM      Time with patient: 10 minutes Preparation and documentation time: 2 minutes Total time: 12 minutes

## 2016-10-20 NOTE — Patient Instructions (Signed)
INCREASE Torsemide to 40 mg (2 tabs) in am and 20 mg (1 tab) in pm.  START Spironolactone to 12.5 mg (1/2 tablet) once daily.  Routine lab work today. Will notify you of abnormal results, otherwise no news is good news!  Return in 2 weeks to repeat labs.  ______________________________________________________________ Marin Roberts Code: 8000  Follow up 6 weeks.  _______________________________________________________________ Marin Roberts Code: 4840  Take all medication as prescribed the day of your appointment. Bring all medications with you to your appointment.  Do the following things EVERYDAY: 1) Weigh yourself in the morning before breakfast. Write it down and keep it in a log. 2) Take your medicines as prescribed 3) Eat low salt foods-Limit salt (sodium) to 2000 mg per day.  4) Stay as active as you can everyday 5) Limit all fluids for the day to less than 2 liters

## 2016-10-20 NOTE — Progress Notes (Signed)
Patient ID: [name] [name], male   DOB: 1955-05-16, 61 y.o.   MRN: [id number]    Advanced Heart Failure Clinic Note   Primary Care: Internal Medicine clinic, Dr. Anselm [name] Primary Cardiologist: Dr. Algie [name]  GI: Dr [name] [name].  Primary HF: Dr. Gala [name]   HPI: [name] [name] is a 61 y.o. 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 Had history of acute renal failure in setting of rhabdo requiring short term HD.    Admitted with new onset HF in April 2014.   He was diuresed and discharged home.  Discharge weight 256 pounds. Echo at the time showed EF 35-40%, grade 1 diastolic dysfunction.    Admitted to [facility] 5/30 through 06/27/12 with GI bleed. EGD 06/25/12 multiple ulcers noted. Continued on protonix.   Admitted 8/2 through 09/03/15 with marked volume overload in the setting of medication compliance. Diuresed with IV lasix and transitioned to torsemide 60 mg daily. Hospital course complicated by elevated renal function.Clonidine was stopped on discharge and carvedilol was cut back to 6.25 mg twice a day. Discharge weight 272 pounds.    He was admitted 11/21-11/24 for Hyperosmolar hyperglycemic state after he had stopped taking all of his diabetes medications.    Admitted 10/01/16-10/05/16 with volume overload. Diuresed 8 pounds with IV lasix. Discharge weight was 295 pounds. He was referred to paramedicine, but refused services. Echo showed EF 20-25%, which was consistent with his prior Echo in 11/2015.   He returns today for HF follow up. Weight up about 10 pounds at home, feels SOB with walking through the grocery store. No orthopnea, PND or chest pain. He is taking his torsemide 20 mg BID, instead of 40qam/20qpm as stated in discharge summary. Also, not taking [name] [name], although it does appear  on his discharge summary. He is trying to watch his sodium intake, has limited income and does the best that he can. Drinking more than 2L a day.   Echo 12/26/15 EF 30-35% RV ok.   ECHO 10/27/12 EF 40-45%  ECHO 06/04/15 LVEF 20-25%, decreased diastolic compliance, LAE, RV severely reduced.  ECHO 11/26/15 EF 30%   RHC 08/2015 RA = 7 RV = 35/10 PA = 37/15 (25) PCW = 10 Fick cardiac output/index = 4.3/1.7 PVR = 3.5 WU Ao sat = 95% PA sat = 58%, 58% Assessment: 1. Volume status looks good. 2. Cardiac output depressed  2014 LHC -  Left main: Normal LAD: Large vessel gives of 2 diagonals. Normal LCX: Very large vessel with 3 OMs. 20-30% plaque in midsection.  RCA: Dominant. Normal LV-gram done in the RAO projection: Ejection fraction = 35-40% global HK Assessment: 1. Minimal CAD  Labs 02/12/13 K 4.3 Creatinine 0.98          08/2013: K 3.9, creatinine 1.27, Mag 1.8, pro-BNP 392, Troponin 0.05          06/07/15: K 4.2, creatinine 1.21          07/10/2015: K 3.9 Creatinine 1.16           09/03/2015: K 4.7 Creatinine 1.32          09/11/2015: K 4.1 Creatinine 1.29   SH: Quit smoking 9 years ago. Lives alone. Quit ETOH October 07, 2013 FH: Mom died from MI in her early 49s  Brother MI and deceased. Does not know about fathers health history.  Review of systems complete and found to be negative unless listed in HPI.     Past Medical History:  Diagnosis Date  . Atrial fibrillation ([place])   . Cancer [facility])    Prostate cancer-bx. 3 weeks ago  . Cataract   . Chronic combined systolic and diastolic CHF (congestive heart failure) (HCC)    a) EF 40-45% per 2D echo (02/2012) with grade 1 DD b)  NICM c) RHC (04/2012): RA: 4, RV 45/3/4, PA 42/9 (24), PCWP 14, Fick CO/CI: 5.2 /2.2, PVR 1.9 WU, PA 62% and 64% d) ECHO (10/2012) EF 40-45%, grade II DD, RV nl  . CKD (chronic kidney disease) stage 2, GFR 60-89 ml/min    BL SCr approximately 1-1.3  . Colitis 05/2009   History of colitis of ascending colon  noted on CT abd/pelvis (05/2009), with interval resolution with subsequent CT  . Continuous chronic alcoholism ([place])   . Degenerative lumbar spinal stenosis    s/p L2-3, L3-4, L4-5 laminectomy partial facetectomy, and bilateral foraminotomy  . Diabetes mellitus without complication ([place])    Type II  . Dysrhythmia    A. Fib  . Family history of adverse reaction to anesthesia    "sister, can't go to sleep"  . GERD (gastroesophageal reflux disease)   . H/O cocaine abuse   . Hepatic steatosis    suspected 2/2 alcohol abuse  . Hepatitis C   . History of pancreatitis 01/2011   Admission for acute pancreatitis presumed 2/2 ongoing alcohol abuse- and hypertriglyceridemia  . History of pneumonia   . HIT (heparin-induced thrombocytopenia) ([place])   . Hyperlipidemia   . Hypertension    uncontrolled with medication noncompliance  . Hypertriglyceridemia   . NICM (nonischemic cardiomyopathy) ([place])    a. LHC (04/2012): nl arteries  . PFO (patent foramen ovale) 01/2012   with right to left shunt, noted per TEE in evaluation for source of embolic stroke in 0/6237  . Rhabdomyolysis 02/22/2012   H/O rhabdomyolysis in 01/2012 that was idiopathic, cause never identified  . Schizophrenia, schizo-affective ([place])   . Shortness of breath dyspnea   . Splenic cyst   . Stroke ([place]) 01/2012   Small cerebellar infarcts right greater than left as well as questionable acute left external capsule and caudate nuclear punctate lacunar infarcts noted per MRI (01/2012) - presumed to be embolic likely source PFO with right to left shunt (noted per TEE 01/ 2014)    Current Outpatient Prescriptions  Medication Sig Dispense Refill  . Blood Glucose Monitoring Suppl (ONE TOUCH ULTRA MINI) w/Device KIT 1 each by Does not apply route as needed. 1 each 0  . Cariprazine HCl (VRAYLAR) 3 MG CAPS Take 3 mg by mouth at bedtime.    . carvedilol (COREG) 6.25 MG tablet take 1 tablet by mouth twice a day with meals 60 tablet 3  .  ENTRESTO 97-103 MG take 1 tablet by mouth twice a day 60 tablet 1  . Insulin Glargine (LANTUS SOLOSTAR) 100 UNIT/ML Solostar Pen Inject 50 Units into the skin daily at 10 pm.    . insulin lispro (HUMALOG KWIKPEN) 100 UNIT/ML KiwkPen Inject 18 Units into the skin 3 (three) times daily.    . metFORMIN (GLUCOPHAGE) 1000 MG tablet Take 1 tablet (1,000 mg total) by mouth 2 (two) times daily with a meal. 60 tablet 11  . ofloxacin (OCUFLOX) 0.3 % ophthalmic solution Place 1 drop into the left eye 4 (four) times daily.   0  . oxyCODONE-acetaminophen (PERCOCET) 10-325 MG  tablet Take 1 tablet by mouth every 4 (four) hours as needed for pain.    . prednisoLONE acetate (PRED FORTE) 1 % ophthalmic suspension Place 1 drop into the left eye 4 (four) times daily.  0  . QUEtiapine (SEROQUEL) 400 MG tablet Take 800 mg by mouth at bedtime.     . rivaroxaban (XARELTO) 20 MG TABS tablet Take 1 tablet (20 mg total) by mouth daily. 90 tablet 3  . rosuvastatin (CRESTOR) 20 MG tablet Take 1 tablet (20 mg total) by mouth daily. 90 tablet 1  . torsemide (DEMADEX) 20 MG tablet Take 40 mg by mouth daily.    . TRAVATAN Z 0.004 % SOLN ophthalmic solution Place 1 drop into the left eye 4 (four) times daily.  0  . XIIDRA 5 % SOLN Place 1 drop into the left eye 4 (four) times daily.   1   No current facility-administered medications for this encounter.     Allergies  Allergen Reactions  . Heparin Other (See Comments)    Documented HIT under problem list Pt states he's not allergic. " They told me not take it anymore"  . Thorazine [Chlorpromazine Hcl] Other (See Comments)    Body freezes up     Vitals:   10/20/16 1030  BP: (!) 136/98  Pulse: 78  SpO2: 98%  Weight: (!) 306 lb 6.4 oz (139 kg)   Wt Readings from Last 3 Encounters:  10/20/16 (!) 306 lb 6.4 oz (139 kg)  10/05/16 295 lb 11.2 oz (134.1 kg)  10/01/16 (!) 302 lb 12.8 oz (137.3 kg)     PHYSICAL EXAM: General: Well appearing, obese male. No resp  difficulty. HEENT: Normal Neck: Supple. JVP hard to assess, appears elevated. Carotids 2+ bilat; no bruits. No thyromegaly or nodule noted. Cor: PMI nondisplaced. RRR, No M/G/R noted Lungs: CTAB, normal effort. Abdomen: Obese, soft, non-tender, distended, no HSM. No bruits or masses. +BS  Extremities: No cyanosis, clubbing, rash, 1+ pedal edema bilaterally  Neuro: Alert & orientedx3, cranial nerves grossly intact. moves all 4 extremities w/o difficulty. Affect pleasant  ASSESSMENT /PLAN 1) Chronic combined systolic/diastolic HF: EF 96-04%, grade II DD (10/2012) now decreased to 20-25%  (05/2015) RV severely reduced. Echo 09/2016 20-25%, RV ok. Had [place] 2014 with minimal CAD.  - NYHA II - Volume elevated on exam, increase torsemide to 40 mg in the am, 20 mg in the pm.  - Start Spiro 12.5 mg hs.  - Check BMET today and again in 10-14 days. He is not on any K supplement. May need to add, will follow BMET.  - Continue carvedilol 6.25 mg BID - Continue Entresto 97-103 mg BID. BMET last week stable.  - EF persistently low, will refer to EP for consultation for ICD.  - Reinforced fluid restriction to < 2 L daily, sodium restriction to less than 2000 mg daily, and the importance of daily weights.     2) HTN - Increasing meds as above  3) PAF:  - NSR on exam - Continue Xarelto, denies melena and hematochezia.   4) ETOH abuse - no longer drinking.   5) CKD stage II - BMET today.   6) ?OSA - Needs sleep study. He has not rescheduled.   7) DMII - Follows with PCP. A1c 7.3  8) Medication Noncompliance - He has been referred to paramedicine but refused their services.    BMET today and in 10-14 days. Follow up in 6 weeks.    Arbutus Leas, NP  10:45 AM

## 2016-10-22 DIAGNOSIS — M545 Low back pain: Secondary | ICD-10-CM | POA: Diagnosis not present

## 2016-10-22 DIAGNOSIS — G894 Chronic pain syndrome: Secondary | ICD-10-CM | POA: Diagnosis not present

## 2016-10-22 DIAGNOSIS — Z79891 Long term (current) use of opiate analgesic: Secondary | ICD-10-CM | POA: Diagnosis not present

## 2016-11-02 ENCOUNTER — Inpatient Hospital Stay (HOSPITAL_COMMUNITY): Admission: RE | Admit: 2016-11-02 | Payer: Self-pay | Source: Ambulatory Visit

## 2016-11-03 ENCOUNTER — Institutional Professional Consult (permissible substitution): Payer: Self-pay | Admitting: Cardiology

## 2016-11-03 NOTE — Progress Notes (Deleted)
Electrophysiology Office Note   Date:  11/03/2016   ID:  [name] [name], [name] [name] 02-05-1955, MRN [id number]  PCP:  Aldine Contes, MD  Cardiologist:  [place] Primary Electrophysiologist: Conya Ellinwood Meredith Leeds, MD    No chief complaint on file.    History of Present Illness: [name] [name] is a 61 y.o. male who is being seen today for the evaluation of CHF at the request of Aldine Contes, MD. Presenting today for electrophysiology evaluation. 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 He has had multiple hospitalizations for volume overload. Most recently, he was discharged 10/05/16. He was diuresed by 8 pounds during that admission.    Today, he denies*** symptoms of palpitations, chest pain, shortness of breath, orthopnea, PND, lower extremity edema, claudication, dizziness, presyncope, syncope, bleeding, or neurologic sequela. The patient is tolerating medications without difficulties.    Past Medical History:  Diagnosis Date  . Atrial fibrillation ([place])   . Cancer [facility])    Prostate cancer-bx. 3 weeks ago  . Cataract   . Chronic combined systolic and diastolic CHF (congestive heart failure) (HCC)    a) EF 40-45% per 2D echo (02/2012) with grade 1 DD b)  NICM c) RHC (04/2012): RA: 4, RV 45/3/4, PA 42/9 (24), PCWP 14, Fick CO/CI: 5.2 /2.2, PVR 1.9 WU, PA 62% and 64% d) ECHO (10/2012) EF 40-45%, grade II DD, RV nl  . CKD (chronic kidney disease) stage 2, GFR 60-89 ml/min    BL SCr approximately 1-1.3  . Colitis 05/2009   History of colitis of ascending colon noted on CT abd/pelvis (05/2009), with interval resolution with subsequent CT  . Continuous chronic alcoholism ([place])   . Degenerative lumbar spinal stenosis    s/p L2-3, L3-4, L4-5  laminectomy partial facetectomy, and bilateral foraminotomy  . Diabetes mellitus without complication ([place])    Type II  . Dysrhythmia    A. Fib  . Family history of adverse reaction to anesthesia    "sister, can't go to sleep"  . GERD (gastroesophageal reflux disease)   . H/O cocaine abuse   . Hepatic steatosis    suspected 2/2 alcohol abuse  . Hepatitis C   . History of pancreatitis 01/2011   Admission for acute pancreatitis presumed 2/2 ongoing alcohol abuse- and hypertriglyceridemia  . History of pneumonia   . HIT (heparin-induced thrombocytopenia) ([place])   . Hyperlipidemia   . Hypertension    uncontrolled with medication noncompliance  . Hypertriglyceridemia   . NICM (nonischemic cardiomyopathy) ([place])    a. LHC (04/2012): nl arteries  . PFO (patent foramen ovale) 01/2012   with right to left shunt, noted per TEE in evaluation for source of embolic stroke in 07/1694  . Rhabdomyolysis 02/22/2012   H/O rhabdomyolysis in 01/2012 that was idiopathic, cause never identified  . Schizophrenia, schizo-affective ([place])   . Shortness of breath dyspnea   . Splenic cyst   . Stroke ([place]) 01/2012   Small cerebellar infarcts right greater than left as well as questionable acute left external capsule and caudate nuclear punctate lacunar infarcts noted per MRI (01/2012) - presumed to be embolic likely source PFO with right to left shunt (noted per TEE 01/ 2014)   Past Surgical History:  Procedure Laterality Date  . ACHILLES TENDON REPAIR Right 2007   "it was torn"  . BACK SURGERY    .  CARDIAC CATHETERIZATION N/A 09/02/2015   Procedure: Right Heart Cath;  Surgeon: Jolaine Artist, MD;  Location: [place] [facility];  Service: Cardiovascular;  Laterality: N/A;  . CATARACT EXTRACTION W/ INTRAOCULAR LENS IMPLANT Right   . ESOPHAGOGASTRODUODENOSCOPY N/A 06/25/2012   Procedure: ESOPHAGOGASTRODUODENOSCOPY (EGD);  Surgeon: Milus Banister, MD;  Location: [place];  Service: Endoscopy;  Laterality:  N/A;  . I&D EXTREMITY  03/20/2011   Procedure: IRRIGATION AND DEBRIDEMENT EXTREMITY;  Surgeon: Kerin Salen, MD;  Location: [place];  Service: Orthopedics;  Laterality: Left;  I&D LEFT ACHILLIES TENDON  . KNEE ARTHROSCOPY Left   . LUMBAR LAMINECTOMY     L2-3, L3-4, L4-5 laminectomy, partial facetectomy  . RESECTION DISTAL CLAVICAL  09/17/2011   Procedure: RESECTION DISTAL CLAVICAL;  Surgeon: Nita Sells, MD;  Location: [place];  Service: Orthopedics;  Laterality: Right;  right shoulder arthroscopy with sad and open distal clavicle excision   . ROBOT ASSISTED LAPAROSCOPIC RADICAL PROSTATECTOMY N/A 12/16/2012   Procedure: ROBOTIC ASSISTED LAPAROSCOPIC PROSTATECTOMY ;  Surgeon: Ardis Hughs, MD;  Location: [facility];  Service: Urology;  Laterality: N/A;  . TONSILLECTOMY       Current Outpatient Prescriptions  Medication Sig Dispense Refill  . Blood Glucose Monitoring Suppl (ONE TOUCH ULTRA MINI) w/Device KIT 1 each by Does not apply route as needed. 1 each 0  . Cariprazine HCl (VRAYLAR) 3 MG CAPS Take 3 mg by mouth at bedtime.    . carvedilol (COREG) 6.25 MG tablet take 1 tablet by mouth twice a day with meals 60 tablet 3  . Insulin Glargine (LANTUS SOLOSTAR) 100 UNIT/ML Solostar Pen Inject 50 Units into the skin daily at 10 pm.    . insulin lispro (HUMALOG KWIKPEN) 100 UNIT/ML KiwkPen Inject 18 Units into the skin 3 (three) times daily.    . metFORMIN (GLUCOPHAGE) 1000 MG tablet Take 1 tablet (1,000 mg total) by mouth 2 (two) times daily with a meal. 60 tablet 11  . ofloxacin (OCUFLOX) 0.3 % ophthalmic solution Place 1 drop into the left eye 4 (four) times daily.   0  . oxyCODONE-acetaminophen (PERCOCET) 10-325 MG tablet Take 1 tablet by mouth every 4 (four) hours as needed for pain.    . prednisoLONE acetate (PRED FORTE) 1 % ophthalmic suspension Place 1 drop into the left eye 4 (four) times daily.  0  . QUEtiapine (SEROQUEL) 400 MG tablet Take 800 mg by mouth at  bedtime.     . rivaroxaban (XARELTO) 20 MG TABS tablet Take 1 tablet (20 mg total) by mouth daily. 90 tablet 3  . rosuvastatin (CRESTOR) 20 MG tablet Take 1 tablet (20 mg total) by mouth daily. 90 tablet 1  . sacubitril-valsartan (ENTRESTO) 97-103 MG Take 1 tablet by mouth 2 (two) times daily. 60 tablet 6  . spironolactone (ALDACTONE) 25 MG tablet Take 0.5 tablets (12.5 mg total) by mouth daily. 45 tablet 3  . torsemide (DEMADEX) 20 MG tablet Take 40 mg (2 tabs) in am and 20 mg (1 tab) in pm. 90 tablet 6  . TRAVATAN Z 0.004 % SOLN ophthalmic solution Place 1 drop into the left eye 4 (four) times daily.  0  . XIIDRA 5 % SOLN Place 1 drop into the left eye 4 (four) times daily.   1   No current facility-administered medications for this visit.     Allergies:   Heparin and Thorazine [chlorpromazine hcl]   Social History:  The patient  reports that he  quit smoking about 15 years ago. His smoking use included Cigarettes. He has a 30.00 pack-year smoking history. He has never used smokeless tobacco. He reports that he drinks alcohol. He reports that he uses drugs, including "Crack" cocaine.   Family History:  The patient's family history includes CAD in his sister; CAD (age of onset: 5) in his brother; CAD (age of onset: 83) in his mother; Hypertension in his unknown relative.    ROS:  Please see the history of present illness.   Otherwise, review of systems is positive for ***.   All other systems are reviewed and negative.    PHYSICAL EXAM: VS:  There were no vitals taken for this visit. , BMI There is no height or weight on file to calculate BMI. GEN: Well nourished, well developed, in no acute distress  HEENT: normal  Neck: no JVD, carotid bruits, or masses Cardiac: ***RRR; no murmurs, rubs, or gallops,no edema  Respiratory:  clear to auscultation bilaterally, normal work of breathing GI: soft, nontender, nondistended, + BS MS: no deformity or atrophy  Skin: warm and dry Neuro:   Strength and sensation are intact Psych: euthymic mood, full affect  EKG:  EKG {ACTION; IS/IS VFI:[id number]} ordered today. Personal review of the ekg ordered shows ***  Recent Labs: 12/17/2015: Magnesium 2.5 10/01/2016: ALT 30; B Natriuretic Peptide 119.8 10/02/2016: Hemoglobin 15.4; Platelets 164 10/05/2016: BUN 25; Creatinine, Ser 1.25; Potassium 4.2; Sodium 137    Lipid Panel     Component Value Date/Time   CHOL 184 08/28/2015 1508   TRIG 190 (H) 08/28/2015 1508   HDL 30 (L) 08/28/2015 1508   CHOLHDL 6.1 08/28/2015 1508   VLDL 38 08/28/2015 1508   LDLCALC 116 (H) 08/28/2015 1508     Wt Readings from Last 3 Encounters:  10/20/16 (!) 306 lb 6.4 oz (139 kg)  10/05/16 295 lb 11.2 oz (134.1 kg)  10/01/16 (!) 302 lb 12.8 oz (137.3 kg)      Other studies Reviewed: Additional studies/ records that were reviewed today include: TTE 10/02/16  Review of the above records today demonstrates:  - Left ventricle: Wall thickness was increased in a pattern of mild   LVH. Systolic function was severely reduced. The estimated   ejection fraction was in the range of 20% to 25%. Features are   consistent with a pseudonormal left ventricular filling pattern,   with concomitant abnormal relaxation and increased filling   pressure (grade 2 diastolic dysfunction).   ASSESSMENT AND PLAN:  1.  Chronic combined systolic and diastolic heart failure: Currently on optimal medical therapy with contrast to, Coreg, and Aldactone. Most recent echo showed an ejection fraction of 20-25%. He would thus qualify for ICD therapy. Risks and benefits discussed. Risks include bleeding, tamponade, infection, and pneumothorax.***  2. Hypertension: ***  3. Paroxysmal atrial fibrillation: Anticoagulated with Xarelto.***  This patients CHA2DS2-VASc Score and unadjusted Ischemic Stroke Rate (% per year) is equal to 3.2 % stroke rate/year from a score of 3  Above score calculated as 1 point each if present [CHF, HTN,  DM, Vascular=MI/PAD/Aortic Plaque, Age if 65-74, or Male] Above score calculated as 2 points each if present [Age > 75, or Stroke/TIA/TE]  4. Alcohol abuse: No longer drinking      Current medicines are reviewed at length with the patient today.   The patient {ACTIONS; HAS/DOES NOT HAVE:19233} concerns regarding his medicines.  The following changes were made today:  {NONE DEFAULTED:18576::"none"}  Labs/ tests ordered today include: *** No orders  of the defined types were placed in this encounter.  Disposition:   FU with Ethon Wymer {gen number [id number]} {Days to years:10300}  Signed, Beda Dula Meredith Leeds, MD  11/03/2016 8:07 AM     [facility] [address] [place] [address] [phone] (office) [phone] (fax)

## 2016-11-04 ENCOUNTER — Encounter: Payer: Self-pay | Admitting: Cardiology

## 2016-11-10 ENCOUNTER — Ambulatory Visit (HOSPITAL_COMMUNITY): Admission: RE | Admit: 2016-11-10 | Payer: Medicare Other | Source: Ambulatory Visit

## 2016-11-10 ENCOUNTER — Inpatient Hospital Stay (HOSPITAL_COMMUNITY): Admission: RE | Admit: 2016-11-10 | Payer: Self-pay | Source: Ambulatory Visit | Admitting: Internal Medicine

## 2016-11-10 ENCOUNTER — Other Ambulatory Visit (HOSPITAL_COMMUNITY): Payer: Self-pay | Admitting: *Deleted

## 2016-11-10 DIAGNOSIS — I5022 Chronic systolic (congestive) heart failure: Secondary | ICD-10-CM

## 2016-11-26 ENCOUNTER — Inpatient Hospital Stay (HOSPITAL_COMMUNITY)
Admission: EM | Admit: 2016-11-26 | Discharge: 2016-11-30 | DRG: 190 | Disposition: A | Discharge status: Home / Self Care | Payer: Medicare Other | Attending: Internal Medicine | Admitting: Internal Medicine

## 2016-11-26 ENCOUNTER — Encounter (HOSPITAL_COMMUNITY): Payer: Self-pay | Admitting: Emergency Medicine

## 2016-11-26 ENCOUNTER — Emergency Department (HOSPITAL_COMMUNITY): Payer: Medicare Other

## 2016-11-26 DIAGNOSIS — E1100 Type 2 diabetes mellitus with hyperosmolarity without nonketotic hyperglycemic-hyperosmolar coma (NKHHC): Secondary | ICD-10-CM

## 2016-11-26 DIAGNOSIS — I429 Cardiomyopathy, unspecified: Secondary | ICD-10-CM | POA: Diagnosis present

## 2016-11-26 DIAGNOSIS — I5023 Acute on chronic systolic (congestive) heart failure: Secondary | ICD-10-CM

## 2016-11-26 DIAGNOSIS — Z794 Long term (current) use of insulin: Secondary | ICD-10-CM | POA: Diagnosis not present

## 2016-11-26 DIAGNOSIS — J441 Chronic obstructive pulmonary disease with (acute) exacerbation: Secondary | ICD-10-CM | POA: Diagnosis not present

## 2016-11-26 DIAGNOSIS — E1122 Type 2 diabetes mellitus with diabetic chronic kidney disease: Secondary | ICD-10-CM | POA: Diagnosis not present

## 2016-11-26 DIAGNOSIS — N179 Acute kidney failure, unspecified: Secondary | ICD-10-CM | POA: Diagnosis not present

## 2016-11-26 DIAGNOSIS — Z7901 Long term (current) use of anticoagulants: Secondary | ICD-10-CM | POA: Diagnosis not present

## 2016-11-26 DIAGNOSIS — I2510 Atherosclerotic heart disease of native coronary artery without angina pectoris: Secondary | ICD-10-CM | POA: Diagnosis present

## 2016-11-26 DIAGNOSIS — I5033 Acute on chronic diastolic (congestive) heart failure: Secondary | ICD-10-CM

## 2016-11-26 DIAGNOSIS — R0602 Shortness of breath: Secondary | ICD-10-CM | POA: Diagnosis present

## 2016-11-26 DIAGNOSIS — T502X5A Adverse effect of carbonic-anhydrase inhibitors, benzothiadiazides and other diuretics, initial encounter: Secondary | ICD-10-CM | POA: Diagnosis not present

## 2016-11-26 DIAGNOSIS — I440 Atrioventricular block, first degree: Secondary | ICD-10-CM | POA: Diagnosis present

## 2016-11-26 DIAGNOSIS — I5042 Chronic combined systolic (congestive) and diastolic (congestive) heart failure: Secondary | ICD-10-CM | POA: Diagnosis not present

## 2016-11-26 DIAGNOSIS — R008 Other abnormalities of heart beat: Secondary | ICD-10-CM | POA: Diagnosis present

## 2016-11-26 DIAGNOSIS — J440 Chronic obstructive pulmonary disease with acute lower respiratory infection: Secondary | ICD-10-CM | POA: Diagnosis present

## 2016-11-26 DIAGNOSIS — Z8249 Family history of ischemic heart disease and other diseases of the circulatory system: Secondary | ICD-10-CM

## 2016-11-26 DIAGNOSIS — M545 Low back pain: Secondary | ICD-10-CM | POA: Diagnosis present

## 2016-11-26 DIAGNOSIS — E1165 Type 2 diabetes mellitus with hyperglycemia: Secondary | ICD-10-CM | POA: Diagnosis present

## 2016-11-26 DIAGNOSIS — J449 Chronic obstructive pulmonary disease, unspecified: Secondary | ICD-10-CM | POA: Diagnosis not present

## 2016-11-26 DIAGNOSIS — Z87891 Personal history of nicotine dependence: Secondary | ICD-10-CM

## 2016-11-26 DIAGNOSIS — Z9114 Patient's other noncompliance with medication regimen: Secondary | ICD-10-CM | POA: Diagnosis not present

## 2016-11-26 DIAGNOSIS — Z79899 Other long term (current) drug therapy: Secondary | ICD-10-CM

## 2016-11-26 DIAGNOSIS — B1920 Unspecified viral hepatitis C without hepatic coma: Secondary | ICD-10-CM | POA: Diagnosis present

## 2016-11-26 DIAGNOSIS — G8929 Other chronic pain: Secondary | ICD-10-CM | POA: Diagnosis present

## 2016-11-26 DIAGNOSIS — R9431 Abnormal electrocardiogram [ECG] [EKG]: Secondary | ICD-10-CM | POA: Diagnosis present

## 2016-11-26 DIAGNOSIS — Z6841 Body Mass Index (BMI) 40.0 and over, adult: Secondary | ICD-10-CM

## 2016-11-26 DIAGNOSIS — R748 Abnormal levels of other serum enzymes: Secondary | ICD-10-CM

## 2016-11-26 DIAGNOSIS — J9621 Acute and chronic respiratory failure with hypoxia: Secondary | ICD-10-CM | POA: Diagnosis not present

## 2016-11-26 DIAGNOSIS — J9601 Acute respiratory failure with hypoxia: Secondary | ICD-10-CM | POA: Diagnosis not present

## 2016-11-26 DIAGNOSIS — I5043 Acute on chronic combined systolic (congestive) and diastolic (congestive) heart failure: Secondary | ICD-10-CM | POA: Diagnosis not present

## 2016-11-26 DIAGNOSIS — R079 Chest pain, unspecified: Secondary | ICD-10-CM | POA: Diagnosis not present

## 2016-11-26 DIAGNOSIS — I493 Ventricular premature depolarization: Secondary | ICD-10-CM | POA: Diagnosis not present

## 2016-11-26 DIAGNOSIS — C61 Malignant neoplasm of prostate: Secondary | ICD-10-CM | POA: Diagnosis present

## 2016-11-26 DIAGNOSIS — N183 Chronic kidney disease, stage 3 (moderate): Secondary | ICD-10-CM | POA: Diagnosis present

## 2016-11-26 DIAGNOSIS — Z888 Allergy status to other drugs, medicaments and biological substances status: Secondary | ICD-10-CM

## 2016-11-26 DIAGNOSIS — I4892 Unspecified atrial flutter: Secondary | ICD-10-CM | POA: Diagnosis not present

## 2016-11-26 DIAGNOSIS — I130 Hypertensive heart and chronic kidney disease with heart failure and stage 1 through stage 4 chronic kidney disease, or unspecified chronic kidney disease: Secondary | ICD-10-CM | POA: Diagnosis present

## 2016-11-26 DIAGNOSIS — E785 Hyperlipidemia, unspecified: Secondary | ICD-10-CM | POA: Diagnosis not present

## 2016-11-26 DIAGNOSIS — J209 Acute bronchitis, unspecified: Secondary | ICD-10-CM | POA: Diagnosis present

## 2016-11-26 DIAGNOSIS — Z7952 Long term (current) use of systemic steroids: Secondary | ICD-10-CM

## 2016-11-26 DIAGNOSIS — Z8701 Personal history of pneumonia (recurrent): Secondary | ICD-10-CM

## 2016-11-26 DIAGNOSIS — Z8673 Personal history of transient ischemic attack (TIA), and cerebral infarction without residual deficits: Secondary | ICD-10-CM

## 2016-11-26 DIAGNOSIS — R944 Abnormal results of kidney function studies: Secondary | ICD-10-CM | POA: Diagnosis not present

## 2016-11-26 DIAGNOSIS — F1020 Alcohol dependence, uncomplicated: Secondary | ICD-10-CM | POA: Diagnosis present

## 2016-11-26 DIAGNOSIS — R05 Cough: Secondary | ICD-10-CM | POA: Diagnosis not present

## 2016-11-26 DIAGNOSIS — E669 Obesity, unspecified: Secondary | ICD-10-CM | POA: Diagnosis present

## 2016-11-26 DIAGNOSIS — I480 Paroxysmal atrial fibrillation: Secondary | ICD-10-CM

## 2016-11-26 DIAGNOSIS — K760 Fatty (change of) liver, not elsewhere classified: Secondary | ICD-10-CM | POA: Diagnosis present

## 2016-11-26 DIAGNOSIS — F209 Schizophrenia, unspecified: Secondary | ICD-10-CM | POA: Diagnosis not present

## 2016-11-26 DIAGNOSIS — Q211 Atrial septal defect: Secondary | ICD-10-CM

## 2016-11-26 DIAGNOSIS — K219 Gastro-esophageal reflux disease without esophagitis: Secondary | ICD-10-CM | POA: Diagnosis present

## 2016-11-26 DIAGNOSIS — E119 Type 2 diabetes mellitus without complications: Secondary | ICD-10-CM | POA: Diagnosis not present

## 2016-11-26 LAB — BASIC METABOLIC PANEL
ANION GAP: 13 (ref 5–15)
BUN: 27 mg/dL — ABNORMAL HIGH (ref 6–20)
CO2: 24 mmol/L (ref 22–32)
Calcium: 10.2 mg/dL (ref 8.9–10.3)
Chloride: 95 mmol/L — ABNORMAL LOW (ref 101–111)
Creatinine, Ser: 1.47 mg/dL — ABNORMAL HIGH (ref 0.61–1.24)
GFR, EST AFRICAN AMERICAN: 57 mL/min — AB (ref >60)
GFR, EST NON AFRICAN AMERICAN: 49 mL/min — AB (ref >60)
GLUCOSE: 455 mg/dL — AB (ref 65–99)
POTASSIUM: 4.9 mmol/L (ref 3.5–5.1)
Sodium: 132 mmol/L — ABNORMAL LOW (ref 135–145)

## 2016-11-26 LAB — CBC
HEMATOCRIT: 44.9 % (ref 39.0–52.0)
HEMOGLOBIN: 15.2 g/dL (ref 13.0–17.0)
MCH: 30.8 pg (ref 26.0–34.0)
MCHC: 33.9 g/dL (ref 30.0–36.0)
MCV: 91.1 fL (ref 78.0–100.0)
Platelets: 147 10*3/uL — ABNORMAL LOW (ref 150–400)
RBC: 4.93 MIL/uL (ref 4.22–5.81)
RDW: 13.3 % (ref 11.5–15.5)
WBC: 4.7 10*3/uL (ref 4.0–10.5)

## 2016-11-26 LAB — CBG MONITORING, ED: Glucose-Capillary: 438 mg/dL — ABNORMAL HIGH (ref 65–99)

## 2016-11-26 LAB — TROPONIN I
TROPONIN I: 0.07 ng/mL — AB (ref <0.03)
TROPONIN I: 0.06 ng/mL — AB (ref <0.03)

## 2016-11-26 LAB — I-STAT TROPONIN, ED: TROPONIN I, POC: 0.09 ng/mL — AB (ref 0.00–0.08)

## 2016-11-26 LAB — GLUCOSE, CAPILLARY: GLUCOSE-CAPILLARY: 442 mg/dL — AB (ref 65–99)

## 2016-11-26 LAB — BRAIN NATRIURETIC PEPTIDE: B NATRIURETIC PEPTIDE 5: 20.7 pg/mL (ref 0.0–100.0)

## 2016-11-26 MED ORDER — GUAIFENESIN ER 600 MG PO TB12
600.0000 mg | ORAL_TABLET | Freq: Two times a day (BID) | ORAL | Status: DC
  Administered 2016-11-26 – 2016-11-30 (×8): 600 mg via ORAL
  Filled 2016-11-26 (×8): qty 1

## 2016-11-26 MED ORDER — LIFITEGRAST 5 % OP SOLN
1.0000 [drp] | Freq: Four times a day (QID) | OPHTHALMIC | Status: DC
Start: 2016-11-26 — End: 2016-11-30

## 2016-11-26 MED ORDER — ACETAMINOPHEN 650 MG RE SUPP: 650.0000 mg | Freq: Four times a day (QID) | RECTAL | Status: DC | PRN

## 2016-11-26 MED ORDER — LATANOPROST 0.005 % OP SOLN
1.0000 [drp] | Freq: Every day | OPHTHALMIC | Status: DC
  Administered 2016-11-26 – 2016-11-29 (×4): 1 [drp] via OPHTHALMIC
  Filled 2016-11-26: qty 2.5

## 2016-11-26 MED ORDER — ACETAMINOPHEN 325 MG PO TABS: 650.0000 mg | ORAL_TABLET | Freq: Four times a day (QID) | ORAL | Status: DC | PRN

## 2016-11-26 MED ORDER — TORSEMIDE 20 MG PO TABS: 20.0000 mg | ORAL_TABLET | Freq: Every day | ORAL | Status: DC

## 2016-11-26 MED ORDER — INSULIN GLARGINE 100 UNIT/ML ~~LOC~~ SOLN
35.0000 [IU] | Freq: Every day | SUBCUTANEOUS | Status: DC
  Administered 2016-11-26: 35 [IU] via SUBCUTANEOUS
  Filled 2016-11-26: qty 0.35

## 2016-11-26 MED ORDER — SACUBITRIL-VALSARTAN 97-103 MG PO TABS
1.0000 | ORAL_TABLET | Freq: Two times a day (BID) | ORAL | Status: DC
  Administered 2016-11-27 – 2016-11-28 (×3): 1 via ORAL
  Filled 2016-11-26 (×3): qty 1

## 2016-11-26 MED ORDER — INSULIN ASPART 100 UNIT/ML ~~LOC~~ SOLN: 8.0000 [IU] | Freq: Three times a day (TID) | SUBCUTANEOUS | Status: DC

## 2016-11-26 MED ORDER — NITROGLYCERIN 2 % TD OINT
1.0000 [in_us] | TOPICAL_OINTMENT | Freq: Once | TRANSDERMAL | Status: AC
  Administered 2016-11-26: 1 [in_us] via TOPICAL
  Filled 2016-11-26: qty 1

## 2016-11-26 MED ORDER — SPIRONOLACTONE 25 MG PO TABS
12.5000 mg | ORAL_TABLET | Freq: Every day | ORAL | Status: DC
  Administered 2016-11-27 – 2016-11-30 (×4): 12.5 mg via ORAL
  Filled 2016-11-26 (×4): qty 1

## 2016-11-26 MED ORDER — CARIPRAZINE HCL 3 MG PO CAPS
3.0000 mg | ORAL_CAPSULE | Freq: Every day | ORAL | Status: DC
  Administered 2016-11-26 – 2016-11-29 (×4): 3 mg via ORAL
  Filled 2016-11-26 (×4): qty 1

## 2016-11-26 MED ORDER — FUROSEMIDE 10 MG/ML IJ SOLN
80.0000 mg | Freq: Two times a day (BID) | INTRAMUSCULAR | Status: AC
Start: 2016-11-26 — End: 2016-11-27
  Administered 2016-11-26 – 2016-11-27 (×2): 80 mg via INTRAVENOUS
  Filled 2016-11-26 (×2): qty 8

## 2016-11-26 MED ORDER — QUETIAPINE FUMARATE 400 MG PO TABS
800.0000 mg | ORAL_TABLET | Freq: Every day | ORAL | Status: DC
  Administered 2016-11-26 – 2016-11-29 (×4): 800 mg via ORAL
  Filled 2016-11-26 (×4): qty 2

## 2016-11-26 MED ORDER — ALBUTEROL SULFATE (2.5 MG/3ML) 0.083% IN NEBU
5.0000 mg | INHALATION_SOLUTION | Freq: Once | RESPIRATORY_TRACT | Status: AC
  Administered 2016-11-26: 5 mg via RESPIRATORY_TRACT

## 2016-11-26 MED ORDER — TORSEMIDE 20 MG PO TABS: 40.0000 mg | ORAL_TABLET | Freq: Every day | ORAL | Status: DC

## 2016-11-26 MED ORDER — PREDNISOLONE ACETATE 1 % OP SUSP
1.0000 [drp] | Freq: Four times a day (QID) | OPHTHALMIC | Status: DC
  Administered 2016-11-26 – 2016-11-30 (×14): 1 [drp] via OPHTHALMIC
  Filled 2016-11-26: qty 1

## 2016-11-26 MED ORDER — CARVEDILOL 6.25 MG PO TABS
6.2500 mg | ORAL_TABLET | Freq: Two times a day (BID) | ORAL | Status: DC
  Administered 2016-11-27 – 2016-11-30 (×7): 6.25 mg via ORAL
  Filled 2016-11-26 (×7): qty 1

## 2016-11-26 MED ORDER — ALBUTEROL SULFATE (2.5 MG/3ML) 0.083% IN NEBU
INHALATION_SOLUTION | RESPIRATORY_TRACT | Status: AC
  Filled 2016-11-26: qty 6

## 2016-11-26 MED ORDER — INSULIN ASPART 100 UNIT/ML ~~LOC~~ SOLN
0.0000 [IU] | Freq: Three times a day (TID) | SUBCUTANEOUS | Status: DC
  Administered 2016-11-26 – 2016-11-27 (×2): 15 [IU] via SUBCUTANEOUS
  Filled 2016-11-26: qty 1

## 2016-11-26 MED ORDER — IPRATROPIUM-ALBUTEROL 0.5-2.5 (3) MG/3ML IN SOLN
3.0000 mL | Freq: Four times a day (QID) | RESPIRATORY_TRACT | Status: DC
  Administered 2016-11-26 – 2016-11-28 (×7): 3 mL via RESPIRATORY_TRACT
  Filled 2016-11-26 (×7): qty 3

## 2016-11-26 MED ORDER — ROSUVASTATIN CALCIUM 10 MG PO TABS
20.0000 mg | ORAL_TABLET | Freq: Every day | ORAL | Status: DC
  Administered 2016-11-27 – 2016-11-30 (×4): 20 mg via ORAL
  Filled 2016-11-26 (×4): qty 2

## 2016-11-26 MED ORDER — IPRATROPIUM-ALBUTEROL 0.5-2.5 (3) MG/3ML IN SOLN: 3.0000 mL | Freq: Four times a day (QID) | RESPIRATORY_TRACT | Status: DC | PRN

## 2016-11-26 MED ORDER — ALBUTEROL SULFATE (2.5 MG/3ML) 0.083% IN NEBU
2.5000 mg | INHALATION_SOLUTION | RESPIRATORY_TRACT | Status: DC | PRN
  Administered 2016-11-27: 2.5 mg via RESPIRATORY_TRACT
  Filled 2016-11-26: qty 3

## 2016-11-26 MED ORDER — RIVAROXABAN 20 MG PO TABS
20.0000 mg | ORAL_TABLET | Freq: Every day | ORAL | Status: DC
  Administered 2016-11-27 – 2016-11-29 (×3): 20 mg via ORAL
  Filled 2016-11-26 (×3): qty 1

## 2016-11-26 MED ORDER — OFLOXACIN 0.3 % OP SOLN
1.0000 [drp] | Freq: Four times a day (QID) | OPHTHALMIC | Status: DC
  Administered 2016-11-26 – 2016-11-30 (×14): 1 [drp] via OPHTHALMIC
  Filled 2016-11-26: qty 5

## 2016-11-26 MED ORDER — INSULIN ASPART 100 UNIT/ML ~~LOC~~ SOLN
0.0000 [IU] | Freq: Every day | SUBCUTANEOUS | Status: DC
  Administered 2016-11-26: 6 [IU] via SUBCUTANEOUS

## 2016-11-26 NOTE — ED Notes (Signed)
Pt remains seated in a chair in the room.  Resting and appears comfortable.  Cards at bedside.

## 2016-11-26 NOTE — ED Notes (Signed)
Pt is sitting in a chair in the room.  Pt reports this is more comfortable than the stretcher.  Pt is still connected to the cardiac monitor.

## 2016-11-26 NOTE — ED Notes (Addendum)
Pt's CBG at 1700 is 438, Guilloud MD notified and okayed to administer 15 units when pt is eating and hold novolog 8 units.

## 2016-11-26 NOTE — ED Notes (Signed)
Teaching services at bedside to admit pt

## 2016-11-26 NOTE — ED Notes (Signed)
Pt is resting, appears comfortable on the chair.  Still waiting for a room assignment

## 2016-11-26 NOTE — ED Notes (Signed)
Meal tray given to pt.  Pt remains seated on the chair.  Still waiting on a room

## 2016-11-26 NOTE — ED Notes (Signed)
Attempted report x1. 

## 2016-11-26 NOTE — ED Notes (Signed)
Attempted report x 2 

## 2016-11-26 NOTE — H&P (Signed)
Date: 11/26/2016               Patient Name:  [name] [name] MRN: [id number]  DOB: 1955/08/15 Age / Sex: 61 y.o., male   PCP: Aldine Contes, MD         Medical Service: Internal Medicine Teaching Service         Attending Physician: Dr. Bartholomew Crews, MD    First Contact: Dr. Isac Sarna  Pager: [phone]  Second Contact: Dr. Philipp Ovens  Pager: [phone]       After Hours (After 5p/  First Contact Pager: [phone]  weekends / holidays): Second Contact Pager: [phone]   Chief Complaint: Shortness of breath   History of Present Illness:  [name] Faster. [name] is a 61 y.o. M with history of combined systolic and diastolic heart failure EF 20% 09/2016, pAFib, CKD stage III, T2DM, HTN, HLD , and schizophrenia who presents to the ED with a 3-day history of progressively worsening shortness of breath.  Patient states he has been wheezing for the past 2 weeks.  He believed he had a cold and reports a sore throat and a productive cough of green sputum for the past 2 weeks as well.  He has a foam cup next to him with very minimal amount of thick, green sputum.  His shortness of breath only started 2-3 days ago and has been progressively worsening. States he can lay flat in bed and believes has lower extremity swelling as well.  Reports compliance with home diuretics.  Denies sick contacts, fevers, chills, chest pain, abdominal pain, N/V, changes in bowel movements, and urinary symptoms.  ED course: Patient is afebrile hemodynamically stable in the ED.  He is satting >95% on room air.  Appears comfortable and is able to speak in full sentences.  Labs remarkable for initial troponin of 0.09 and hyperglycemia with CBG 455.  Chest x-ray with cardiomegaly but negative for acute processes.  He received SL nitroglycerin x2 and albuterol nebs x1.  Review of Systems: A complete ROS was negative except as per HPI.    Meds:  Current Meds  Medication Sig  . B-D UF III MINI PEN NEEDLES 31G X 5 MM  MISC 1 each 3 (three) times daily as needed.   . Blood Glucose Monitoring Suppl (ONE TOUCH ULTRA MINI) w/Device KIT 1 each by Does not apply route as needed.  . carvedilol (COREG) 6.25 MG tablet take 1 tablet by mouth twice a day with meals  . Insulin Glargine (LANTUS SOLOSTAR) 100 UNIT/ML Solostar Pen Inject 50 Units into the skin daily at 10 pm.  . insulin lispro (HUMALOG KWIKPEN) 100 UNIT/ML KiwkPen Inject 18 Units into the skin 3 (three) times daily.  Marland Kitchen ofloxacin (OCUFLOX) 0.3 % ophthalmic solution Place 1 drop into the left eye 4 (four) times daily.   . ONE TOUCH ULTRA TEST test strip 1 each by Other route as needed (FOR BLOOD TESTING).   Marland Kitchen prednisoLONE acetate (PRED FORTE) 1 % ophthalmic suspension Place 1 drop into the left eye 4 (four) times daily.  . QUEtiapine (SEROQUEL) 400 MG tablet Take 800 mg by mouth at bedtime.   . rivaroxaban (XARELTO) 20 MG TABS tablet Take 1 tablet (20 mg total) by mouth daily.  . rosuvastatin (CRESTOR) 20 MG tablet Take 1 tablet (20 mg total) by mouth daily.  . sacubitril-valsartan (ENTRESTO) 97-103 MG Take 1 tablet by mouth 2 (two) times daily.  Marland Kitchen spironolactone (ALDACTONE) 25 MG tablet Take 0.5 tablets (12.5  mg total) by mouth daily.  Marland Kitchen torsemide (DEMADEX) 20 MG tablet Take 40 mg (2 tabs) in am and 20 mg (1 tab) in pm. (Patient taking differently: Take 20-40 mg by mouth 2 (two) times daily. Take 40 mg (2 tabs) in the morning and 20 mg (1 tab) in the evening)  . TRAVATAN Z 0.004 % SOLN ophthalmic solution Place 1 drop into the left eye 4 (four) times daily.  Marland Kitchen XIIDRA 5 % SOLN Place 1 drop into the left eye 4 (four) times daily.      Allergies: Allergies as of 11/26/2016 - Review Complete 11/26/2016  Allergen Reaction Noted  . Heparin Other (See Comments) 12/16/2012  . Thorazine [chlorpromazine hcl] Other (See Comments)    Past Medical History:  Diagnosis Date  . Atrial fibrillation ([place])   . Cancer [facility])    Prostate cancer-bx. 3 weeks ago  .  Cataract   . Chronic combined systolic and diastolic CHF (congestive heart failure) (HCC)    a) EF 40-45% per 2D echo (02/2012) with grade 1 DD b)  NICM c) RHC (04/2012): RA: 4, RV 45/3/4, PA 42/9 (24), PCWP 14, Fick CO/CI: 5.2 /2.2, PVR 1.9 WU, PA 62% and 64% d) ECHO (10/2012) EF 40-45%, grade II DD, RV nl  . CKD (chronic kidney disease) stage 2, GFR 60-89 ml/min    BL SCr approximately 1-1.3  . Colitis 05/2009   History of colitis of ascending colon noted on CT abd/pelvis (05/2009), with interval resolution with subsequent CT  . Continuous chronic alcoholism ([place])   . Degenerative lumbar spinal stenosis    s/p L2-3, L3-4, L4-5 laminectomy partial facetectomy, and bilateral foraminotomy  . Diabetes mellitus without complication ([place])    Type II  . Dysrhythmia    A. Fib  . Family history of adverse reaction to anesthesia    "sister, can't go to sleep"  . GERD (gastroesophageal reflux disease)   . H/O cocaine abuse   . Hepatic steatosis    suspected 2/2 alcohol abuse  . Hepatitis C   . History of pancreatitis 01/2011   Admission for acute pancreatitis presumed 2/2 ongoing alcohol abuse- and hypertriglyceridemia  . History of pneumonia   . HIT (heparin-induced thrombocytopenia) ([place])   . Hyperlipidemia   . Hypertension    uncontrolled with medication noncompliance  . Hypertriglyceridemia   . NICM (nonischemic cardiomyopathy) ([place])    a. LHC (04/2012): nl arteries  . PFO (patent foramen ovale) 01/2012   with right to left shunt, noted per TEE in evaluation for source of embolic stroke in 07/6718  . Rhabdomyolysis 02/22/2012   H/O rhabdomyolysis in 01/2012 that was idiopathic, cause never identified  . Schizophrenia, schizo-affective ([place])   . Shortness of breath dyspnea   . Splenic cyst   . Stroke ([place]) 01/2012   Small cerebellar infarcts right greater than left as well as questionable acute left external capsule and caudate nuclear punctate lacunar infarcts noted per MRI (01/2012) -  presumed to be embolic likely source PFO with right to left shunt (noted per TEE 01/ 2014)    Family History:  Family History  Problem Relation Age of Onset  . CAD Mother 69       deceased  . CAD Sister   . CAD Brother 18       died from MI at age 7yo  . Hypertension Unknown      Social History:  Social History  Substance Use Topics  . Smoking status: Former Smoker  Packs/day: 1.00    Years: 30.00    Types: Cigarettes    Quit date: 06/24/2001  . Smokeless tobacco: Never Used  . Alcohol use 0.0 oz/week     Physical Exam: Blood pressure 99/75, pulse 72, temperature 98.3 F (36.8 C), temperature source Oral, resp. rate (!) 22, SpO2 95 %.  General: Pleasant male, obese, well-developed, sitting up in chair in no acute distress Cardiac: regular rate and rhythm, nl S1/S2, no murmurs, rubs or gallops, no JVD appreciated however difficult to assess due to patient body habitus Pulm: diffuse wheezing, no crackles noted, no increased work of breathing  Abd: soft, NTND, bowel sounds present Neuro: A&Ox3, able to move all 4 extremities but no focal deficits noted Ext: warm and well perfused, 1+ bilateral pitting edema up to shins    EKG: personally reviewed my interpretation is HR 9 bpm, first-degree AV block (unchanged from prior), multiple PVCs noted, right axis deviation, QTc 467 ms, no acute signs of ischemia noted   CXR: personally reviewed my interpretation is patent airways, enlargement of cardiac silhouette, vascular congestion noted, no effusions, opacities, or consolidations noted   Assessment & Plan by Problem:  Marcello Moores A. Mickley is a 61 y.o. M with history of combined systolic and diastolic heart failure EF 20% 09/2016, pAFib, CKD stage III, T2DM, HTN, HLD who presents to the ED with a 3-day history of progressively worsening shortness of breath associated with productive cough x2 weeks.  He is afebrile, hemodynamically stable, and satting well on room air but does have  significant diffuse wheezing on exam and mild lower extremity edema.  Lab work remarkable for [place]. However, patient has troponinemia at baseline.  Will trend troponin but no suspicion of ACS at this time. Concern for respiratory illness and CHF exacerbation.  # Shortness of breath: Patient presents with 3-day history of progressively worsening shortness of breath associated with cough productive of green sputum.  Chest x-ray negative for acute processes.  However, this does not rule out respiratory illness.  Initial troponin elevated at 0.09 however patient has a troponin at baseline.  CHF exacerbation also in the differential as he appears hypervolemic on exam. Cardiology consulted in the ED who believe CHF exacerbation might have been trigger in the setting of acute bronchitis.  -IV Lasix 80 mg twice daily x2 doses per cardiology recommendations -BMP in AM  -Strict I/Os -Daily weights  -Trend troponin -Mucinex 600 mg BID + scheduled duonebs QID PRN  - BNP and RVP pending   # Chronic combined systolic and diastolic HF EF 40% # Paroxysmal Atrial Fibrillation  # HTN  -Continue home Entresto, spironolactone, Coreg, and rosuvastatin  -Continue home Xarelto  # T2DM: On metformin 1000 mg + Lantus 50 QHS + Humalog 18U TID with meals. Last A1c 7.3 09/2016. - Lantus 35U QHS + Novolog 8U TID + SSI-S + night time coverage - CBG monitoring    # CKD stage III: Cr 1.47 with BL Cr 1.2-1.3 -Avoid nephrotoxic medications -BMP in AM   # Schizophrenia:  -Continue home Seroquel 800 mg QHS    F: none  E: will continue to monitor and replace as needed N: HH diet   VTE ppx: home Xarelto   Code status: Full code, confirmed on admission   Dispo: Admit patient to Observation with expected length of stay less than 2 midnights.  Signed: Welford Roche, MD  Internal Medicine PGY-1  P [phone]

## 2016-11-26 NOTE — ED Triage Notes (Signed)
Pt reports SOB with wheezing x 10 days.  Pt denies CP, reports cough with scant sputum, denies fevers/chills.

## 2016-11-26 NOTE — ED Notes (Signed)
Pt reports SOB with audible wheezing x 10 days.  Pt's breathing is regular and unlabored.  Pt's chest is symmetrical.  Pt is A&O x 4.  Asked pt to lay in the stretcher so that he can be placed on the cardiac monitor.

## 2016-11-26 NOTE — H&P (Signed)
Date: 11/26/2016               Patient Name:  [name] [name] MRN: [id number]  DOB: 1955/09/12 Age / Sex: 61 y.o., male   PCP: Aldine Contes, MD              Medical Service: Internal Medicine Teaching Service              Attending Physician: Dr. Bartholomew Crews, MD    First Contact: Luretha Rued, MS 3 Pager: [phone]  Second Contact: Dr. Isac Sarna Pager:   Third Contact Dr. Philipp Ovens Pager:        After Hours (After 5p/  First Contact Pager: [phone]  weekends / holidays): Second Contact Pager: [phone]   Chief Complaint:  Shortness of breath with wheezing and coughing  History of Present Illness: 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  Patient reports a 2 week history of progressive wheezing and coughing. Cough is productive of green phlegm. Denies any fever, chills, headache, n/v, recent travel or sick contacts. Patient reports shortness of breath that started 3 days ago. SOB is aggravated by exertion and lying down. Patient states that current symptoms similar to when he gets volume overloaded. Denies any chest pain, palpitations, or diaphoresis.  Meds: Current Facility-Administered Medications  Medication Dose Route Frequency Provider Last Rate Last Dose  . albuterol (PROVENTIL) (2.5 MG/3ML) 0.083% nebulizer solution 2.5 mg  2.5 mg Nebulization Q4H PRN Velna Ochs, MD      . albuterol (PROVENTIL) (2.5 MG/3ML) 0.083% nebulizer solution           . guaiFENesin (MUCINEX) 12 hr tablet 600 mg  600 mg Oral BID Velna Ochs, MD      . insulin aspart (novoLOG) injection 0-15 Units  0-15 Units Subcutaneous TID WC Velna Ochs, MD      . insulin aspart (novoLOG) injection 0-5 Units  0-5 Units Subcutaneous QHS Velna Ochs, MD      . insulin aspart (novoLOG) injection 8 Units  8 Units Subcutaneous TID WC Velna Ochs, MD      . insulin glargine (LANTUS) injection 35 Units  35 Units Subcutaneous QHS Guilloud, [name], MD      . ipratropium-albuterol (DUONEB) 0.5-2.5 (3) MG/3ML nebulizer solution 3 mL  3 mL Nebulization Q6H Velna Ochs, MD   3 mL at 11/26/16 1611  . torsemide (DEMADEX) tablet 20 mg  20 mg Oral Daily Velna Ochs, MD      . Derrill Memo ON 11/27/2016] torsemide (DEMADEX) tablet 40 mg  40 mg Oral Daily Velna Ochs, MD       Current Outpatient Prescriptions  Medication Sig Dispense Refill  . B-D UF III MINI PEN NEEDLES 31G X 5 MM MISC 1 each 3 (three) times daily as needed.   1  . Blood Glucose Monitoring Suppl (ONE TOUCH ULTRA MINI) w/Device KIT 1 each by Does not apply route as needed. 1 each 0  . carvedilol (COREG) 6.25 MG tablet take 1 tablet by mouth twice a day with meals 60 tablet 3  . Insulin Glargine (LANTUS SOLOSTAR) 100 UNIT/ML Solostar Pen Inject 50 Units into the skin daily at 10 pm.    . insulin lispro (HUMALOG KWIKPEN) 100 UNIT/ML KiwkPen Inject 18 Units into the skin 3 (three) times  daily.    . ofloxacin (OCUFLOX) 0.3 % ophthalmic solution Place 1 drop into the left eye 4 (four) times daily.   0  . ONE TOUCH ULTRA TEST test strip 1 each by Other route as needed (FOR BLOOD TESTING).   0  . prednisoLONE acetate (PRED FORTE) 1 % ophthalmic suspension Place 1 drop into the left eye 4 (four) times daily.  0  . QUEtiapine (SEROQUEL) 400 MG tablet Take 800 mg by mouth at bedtime.     . rivaroxaban (XARELTO) 20 MG TABS tablet Take 1 tablet (20 mg total) by mouth daily. 90 tablet 3  . rosuvastatin (CRESTOR) 20 MG tablet Take 1 tablet (20 mg total) by mouth daily. 90 tablet 1  . sacubitril-valsartan (ENTRESTO) 97-103 MG Take 1 tablet by mouth 2 (two) times daily. 60 tablet 6  . spironolactone (ALDACTONE) 25 MG tablet Take 0.5 tablets (12.5 mg total) by mouth daily. 45  tablet 3  . torsemide (DEMADEX) 20 MG tablet Take 40 mg (2 tabs) in am and 20 mg (1 tab) in pm. (Patient taking differently: Take 20-40 mg by mouth 2 (two) times daily. Take 40 mg (2 tabs) in the morning and 20 mg (1 tab) in the evening) 90 tablet 6  . TRAVATAN Z 0.004 % SOLN ophthalmic solution Place 1 drop into the left eye 4 (four) times daily.  0  . XIIDRA 5 % SOLN Place 1 drop into the left eye 4 (four) times daily.   1  . Cariprazine HCl (VRAYLAR) 3 MG CAPS Take 3 mg by mouth at bedtime.    . metFORMIN (GLUCOPHAGE) 1000 MG tablet Take 1 tablet (1,000 mg total) by mouth 2 (two) times daily with a meal. 60 tablet 11  . oxyCODONE-acetaminophen (PERCOCET) 10-325 MG tablet Take 1 tablet by mouth every 4 (four) hours as needed for pain.      Allergies: Allergies as of 11/26/2016 - Review Complete 11/26/2016  Allergen Reaction Noted  . Heparin Other (See Comments) 12/16/2012  . Thorazine [chlorpromazine hcl] Other (See Comments)    Past Medical History:  Diagnosis Date  . Atrial fibrillation ([place])   . Cancer [facility])    Prostate cancer-bx. 3 weeks ago  . Cataract   . Chronic combined systolic and diastolic CHF (congestive heart failure) ([facility])    a) EF 40-45% per 2D echo (02/2012) with grade 1 DD b)  NICM c) RHC (04/2012): RA: 4, RV 45/3/4, PA 42/9 (24), PCWP 14, Fick CO/CI: 5.2 /2.2, PVR 1.9 WU, PA 62% and 64% d) ECHO (10/2012) EF 40-45%, grade II DD, RV nl  . CKD (chronic kidney disease) stage 2, GFR 60-89 ml/min    BL SCr approximately 1-1.3  . Colitis 05/2009   History of colitis of ascending colon noted on CT abd/pelvis (05/2009), with interval resolution with subsequent CT  . Continuous chronic alcoholism ([place])   . Degenerative lumbar spinal stenosis    s/p L2-3, L3-4, L4-5 laminectomy partial facetectomy, and bilateral foraminotomy  . Diabetes mellitus without complication ([place])    Type II  . Dysrhythmia    A. Fib  . Family history of adverse reaction to anesthesia    "sister,  can't go to sleep"  . GERD (gastroesophageal reflux disease)   . H/O cocaine abuse   . Hepatic steatosis    suspected 2/2 alcohol abuse  . Hepatitis C   . History of pancreatitis 01/2011   Admission for acute pancreatitis presumed 2/2 ongoing alcohol abuse- and hypertriglyceridemia  .  History of pneumonia   . HIT (heparin-induced thrombocytopenia) ([place])   . Hyperlipidemia   . Hypertension    uncontrolled with medication noncompliance  . Hypertriglyceridemia   . NICM (nonischemic cardiomyopathy) ([place])    a. LHC (04/2012): nl arteries  . PFO (patent foramen ovale) 01/2012   with right to left shunt, noted per TEE in evaluation for source of embolic stroke in 05/567  . Rhabdomyolysis 02/22/2012   H/O rhabdomyolysis in 01/2012 that was idiopathic, cause never identified  . Schizophrenia, schizo-affective ([place])   . Shortness of breath dyspnea   . Splenic cyst   . Stroke ([place]) 01/2012   Small cerebellar infarcts right greater than left as well as questionable acute left external capsule and caudate nuclear punctate lacunar infarcts noted per MRI (01/2012) - presumed to be embolic likely source PFO with right to left shunt (noted per TEE 01/ 2014)   Past Surgical History:  Procedure Laterality Date  . ACHILLES TENDON REPAIR Right 2007   "it was torn"  . BACK SURGERY    . CARDIAC CATHETERIZATION N/A 09/02/2015   Procedure: Right Heart Cath;  Surgeon: Jolaine Artist, MD;  Location: [facility];  Service: Cardiovascular;  Laterality: N/A;  . CATARACT EXTRACTION W/ INTRAOCULAR LENS IMPLANT Right   . ESOPHAGOGASTRODUODENOSCOPY N/A 06/25/2012   Procedure: ESOPHAGOGASTRODUODENOSCOPY (EGD);  Surgeon: Milus Banister, MD;  Location: [facility];  Service: Endoscopy;  Laterality: N/A;  . I&D EXTREMITY  03/20/2011   Procedure: IRRIGATION AND DEBRIDEMENT EXTREMITY;  Surgeon: Kerin Salen, MD;  Location: [facility];  Service: Orthopedics;  Laterality: Left;  I&D LEFT ACHILLIES TENDON  . KNEE  ARTHROSCOPY Left   . LUMBAR LAMINECTOMY     L2-3, L3-4, L4-5 laminectomy, partial facetectomy  . RESECTION DISTAL CLAVICAL  09/17/2011   Procedure: RESECTION DISTAL CLAVICAL;  Surgeon: Nita Sells, MD;  Location: [facility];  Service: Orthopedics;  Laterality: Right;  right shoulder arthroscopy with sad and open distal clavicle excision   . ROBOT ASSISTED LAPAROSCOPIC RADICAL PROSTATECTOMY N/A 12/16/2012   Procedure: ROBOTIC ASSISTED LAPAROSCOPIC PROSTATECTOMY ;  Surgeon: Ardis Hughs, MD;  Location: [facility];  Service: Urology;  Laterality: N/A;  . TONSILLECTOMY     Family History  Problem Relation Age of Onset  . CAD Mother 50       deceased  . CAD Sister   . CAD Brother 63       died from MI at age 71yo  . Hypertension Unknown    Social History   Social History  . Marital status: Single    Spouse name: N/A  . Number of children: N/A  . Years of education: 12th grade   Occupational History  . Disability     2/2 schizophrenia   Social History Main Topics  . Smoking status: Former Smoker    Packs/day: 1.00    Years: 30.00    Types: Cigarettes    Quit date: 06/24/2001  . Smokeless tobacco: Never Used  . Alcohol use 0.0 oz/week  . Drug use: Yes    Types: "Crack" cocaine     Comment: h/o cocaine abuse; last use about 2 months ago (10/01/16)  . Sexual activity: Yes   Other Topics Concern  . Not on file   Social History Narrative   Lives in [place] alone, has a [facility] aide that helps with medications 4-5 days a week with medications, helping to clean.    Review of Systems: Pertinent items noted in  HPI and remainder of comprehensive ROS otherwise negative.  Physical Exam: Blood pressure 99/75, pulse 82, temperature 98.3 F (36.8 C), temperature source Oral, resp. rate 17, SpO2 98 %. General appearance: alert, cooperative and no distress Lungs: wheezes bilaterally Heart: regular rate and rhythm, S1, S2 normal, no murmur, click, rub or  gallop Abdomen: soft, non-tender; bowel sounds normal; no masses,  no organomegaly Pulses: 2+ and symmetric Neurologic: Cranial nerves: normal alert and oriented x3  Lab results: I-stat troponin mildly elevated at 0.09.   Imaging results:  Dg Chest 2 View  Result Date: 11/26/2016 CLINICAL DATA:  Shortness of breath, wheezing and cough for 10 days. EXAM: CHEST  2 VIEW COMPARISON:  PA and lateral chest 10/01/2016 and 12/17/2015. CT chest 04/05/2013. FINDINGS: There is cardiomegaly without edema. Small focus of discoid atelectasis in the lingula is noted. No pneumothorax or pleural effusion. Postoperative change right AC joint is incidentally noted. IMPRESSION: Cardiomegaly without acute disease. Electronically Signed   By: Inge Rise M.D.   On: 11/26/2016 10:28    Other results: EKG: normal sinus rhythm, 1st degree AV block, rightward axis, abnormal EKG.  Assessment & Plan by Problem: Active Problems:   SOB (shortness of breath)  61 year old man with a history of congestive heart failure, chronic kidney disease, and atrial fibrillation presents with a 3 day hx of progressive shortness of breath following a 2 week history of wheezing and productive cough. Although patient had a mildly elevated troponin of 0.09 his EKG is unchanged from prior admissions and he denied any chest pain. Will continue to trend troponin to rule out ACS. Patient presents due to dyspnea with exertion in the setting of likely URI, CXR negative for signs of infiltrates.  Shortness of Breath/Wheezing -Continue home Torsemide regimen (41m am, 269mpm) -DUONEB 22m52m6hrs -Proventil 2.5mg37mbulization q4hrs PRN -Respiratory Viral panel  Elevated troponin -trending troponin x3  DM type 2 -Insulin 8units TID, 35 units daily at bedtime, sliding scale novolog  This is a [employer] information officere.  The care of the patient was discussed with Dr. SantIsac Sarna the assessment and plan was formulated with their  assistance.  Please see their note for official documentation of the patient encounter.   Signed: McMiRonnette Juniperdical Student 11/26/2016, 4:19 PM  My pager: [phone]

## 2016-11-26 NOTE — ED Notes (Signed)
RT at bedside to start neb tx as ordered

## 2016-11-26 NOTE — ED Notes (Signed)
Patient transported to X-ray 

## 2016-11-26 NOTE — ED Notes (Signed)
Pt ambulated to the BR without difficulty and without assist.

## 2016-11-26 NOTE — Consult Note (Signed)
Cardiology Consultation:   Patient ID: [name] [name]; [id number]; August 28, 1955   Admit date: 11/26/2016 Date of Consult: 11/26/2016  Primary Care Provider: Aldine Contes, MD Primary Cardiologist: Dr. Haroldine Laws Primary Electrophysiologist:     Patient Profile:   [name] [name] is a 61 y.o. male with a hx of stroke (01/2012), PFO (right to left shunt), schizophrenia, NICM with LHC 04/2012 with normal cors, HTN, HLD, HIT, Hep C, hx of cocaine use, alcoholism, GERD, DMII, CKD stage II, chronic combined systolic and diastolic heart failure, prostrate cancer, and atrial fibrillation who is being seen today for the evaluation of chest pain at the request of Dr. Lynnae January.  History of Present Illness:   [name] [name] is known to the heart failure service and last saw Jettie Booze NP on 10/20/16. This was a follow up for a recent admission 10/01/16-10/05/16 for volume overload. He was diuresed 8 lbs and discharged at 295 lbs. He refused paramedicine services at that time. I clinic, his weight was up 10 lbs and he was short of breath while walking in the grocery store. He stated that he was taking his torsemide 20 mg BID instead of the 40 mg qAM and 20 mg qPM as prescribed. He as also not compliant taking his spironolactone. He was also drinking more than 2L per day.  His torsemide was increased to his previously scheduled regimen (as above).  He reported to [facility] with a 1 week history of wheezing. He denies shortness of breath and chest pain but then states he's been wheezing with a sore throat. He admits to missing a demadex dose 2 days ago - missing his morning 40 mg. He also states his legs are mildly swollen and that he probably has increased fluid. He states he is compliant on all of his medications and has an appt with CHF clinic on Nov 5. He states he is breathing better and wishes to discharge home.   He had a mildly elevated troponin of 0.09. EPIC review shows that he always has a mildly elevated  POC troponin dating back to 2014.   He had a myoview 06/25/15 that showed no reversible defect, but reduced LVEF, consistent with echo.   Past Medical History:  Diagnosis Date  . Atrial fibrillation ([place])   . Cancer [facility])    Prostate cancer-bx. 3 weeks ago  . Cataract   . Chronic combined systolic and diastolic CHF (congestive heart failure) (HCC)    a) EF 40-45% per 2D echo (02/2012) with grade 1 DD b)  NICM c) RHC (04/2012): RA: 4, RV 45/3/4, PA 42/9 (24), PCWP 14, Fick CO/CI: 5.2 /2.2, PVR 1.9 WU, PA 62% and 64% d) ECHO (10/2012) EF 40-45%, grade II DD, RV nl  . CKD (chronic kidney disease) stage 2, GFR 60-89 ml/min    BL SCr approximately 1-1.3  . Colitis 05/2009   History of colitis of ascending colon noted on CT abd/pelvis (05/2009), with interval resolution with subsequent CT  . Continuous chronic alcoholism ([place])   . Degenerative lumbar spinal stenosis    s/p L2-3, L3-4, L4-5 laminectomy partial facetectomy, and bilateral foraminotomy  . Diabetes mellitus without complication ([place])    Type II  . Dysrhythmia    A. Fib  . Family history of adverse reaction to anesthesia    "sister, can't go to sleep"  . GERD (gastroesophageal reflux disease)   . H/O cocaine abuse   . Hepatic steatosis    suspected 2/2 alcohol abuse  . Hepatitis C   .  History of pancreatitis 01/2011   Admission for acute pancreatitis presumed 2/2 ongoing alcohol abuse- and hypertriglyceridemia  . History of pneumonia   . HIT (heparin-induced thrombocytopenia) ([place])   . Hyperlipidemia   . Hypertension    uncontrolled with medication noncompliance  . Hypertriglyceridemia   . NICM (nonischemic cardiomyopathy) ([place])    a. LHC (04/2012): nl arteries  . PFO (patent foramen ovale) 01/2012   with right to left shunt, noted per TEE in evaluation for source of embolic stroke in 07/6732  . Rhabdomyolysis 02/22/2012   H/O rhabdomyolysis in 01/2012 that was idiopathic, cause never identified  . Schizophrenia,  schizo-affective ([place])   . Shortness of breath dyspnea   . Splenic cyst   . Stroke ([place]) 01/2012   Small cerebellar infarcts right greater than left as well as questionable acute left external capsule and caudate nuclear punctate lacunar infarcts noted per MRI (01/2012) - presumed to be embolic likely source PFO with right to left shunt (noted per TEE 01/ 2014)    Past Surgical History:  Procedure Laterality Date  . ACHILLES TENDON REPAIR Right 2007   "it was torn"  . BACK SURGERY    . CARDIAC CATHETERIZATION N/A 09/02/2015   Procedure: Right Heart Cath;  Surgeon: Jolaine Artist, MD;  Location: [place] [facility];  Service: Cardiovascular;  Laterality: N/A;  . CATARACT EXTRACTION W/ INTRAOCULAR LENS IMPLANT Right   . ESOPHAGOGASTRODUODENOSCOPY N/A 06/25/2012   Procedure: ESOPHAGOGASTRODUODENOSCOPY (EGD);  Surgeon: Milus Banister, MD;  Location: [place];  Service: Endoscopy;  Laterality: N/A;  . I&D EXTREMITY  03/20/2011   Procedure: IRRIGATION AND DEBRIDEMENT EXTREMITY;  Surgeon: Kerin Salen, MD;  Location: [place];  Service: Orthopedics;  Laterality: Left;  I&D LEFT ACHILLIES TENDON  . KNEE ARTHROSCOPY Left   . LUMBAR LAMINECTOMY     L2-3, L3-4, L4-5 laminectomy, partial facetectomy  . RESECTION DISTAL CLAVICAL  09/17/2011   Procedure: RESECTION DISTAL CLAVICAL;  Surgeon: Nita Sells, MD;  Location: [place];  Service: Orthopedics;  Laterality: Right;  right shoulder arthroscopy with sad and open distal clavicle excision   . ROBOT ASSISTED LAPAROSCOPIC RADICAL PROSTATECTOMY N/A 12/16/2012   Procedure: ROBOTIC ASSISTED LAPAROSCOPIC PROSTATECTOMY ;  Surgeon: Ardis Hughs, MD;  Location: [facility];  Service: Urology;  Laterality: N/A;  . TONSILLECTOMY       Home Medications:  Prior to Admission medications   Medication Sig Start Date End Date Taking? Authorizing Provider  B-D UF III MINI PEN NEEDLES 31G X 5 MM MISC 1 each 3 (three) times daily as  needed.  11/23/16  Yes [provider]  Blood Glucose Monitoring Suppl (ONE TOUCH ULTRA MINI) w/Device KIT 1 each by Does not apply route as needed. 12/19/15  Yes Minus Liberty, MD  carvedilol (COREG) 6.25 MG tablet take 1 tablet by mouth twice a day with meals 04/30/16  Yes Bensimhon, Shaune Pascal, MD  Insulin Glargine (LANTUS SOLOSTAR) 100 UNIT/ML Solostar Pen Inject 50 Units into the skin daily at 10 pm.   Yes [provider]  insulin lispro (HUMALOG KWIKPEN) 100 UNIT/ML KiwkPen Inject 18 Units into the skin 3 (three) times daily.   Yes [provider]  ofloxacin (OCUFLOX) 0.3 % ophthalmic solution Place 1 drop into the left eye 4 (four) times daily.  08/25/16  Yes [provider]  ONE TOUCH ULTRA TEST test strip 1 each by Other route as needed (FOR BLOOD TESTING).  11/10/16  Yes [provider]  prednisoLONE acetate (PRED FORTE) 1 % ophthalmic suspension Place 1 drop into the left eye 4 (four) times daily. 08/25/16  Yes [provider]  QUEtiapine (SEROQUEL) 400 MG tablet Take 800 mg by mouth at bedtime.    Yes [provider]  rivaroxaban (XARELTO) 20 MG TABS tablet Take 1 tablet (20 mg total) by mouth daily. 10/01/16  Yes Alphonzo Grieve, MD  rosuvastatin (CRESTOR) 20 MG tablet Take 1 tablet (20 mg total) by mouth daily. 10/20/16  Yes Arbutus Leas, NP  sacubitril-valsartan (ENTRESTO) 97-103 MG Take 1 tablet by mouth 2 (two) times daily. 10/20/16  Yes Arbutus Leas, NP  spironolactone (ALDACTONE) 25 MG tablet Take 0.5 tablets (12.5 mg total) by mouth daily. 10/20/16 01/18/17 Yes Arbutus Leas, NP  torsemide (DEMADEX) 20 MG tablet Take 40 mg (2 tabs) in am and 20 mg (1 tab) in pm. Patient taking differently: Take 20-40 mg by mouth 2 (two) times daily. Take 40 mg (2 tabs) in the morning and 20 mg (1 tab) in the evening 10/20/16  Yes Arbutus Leas, NP  TRAVATAN Z 0.004 % SOLN ophthalmic solution Place 1 drop into the left eye 4 (four) times  daily. 07/21/16  Yes [provider]  XIIDRA 5 % SOLN Place 1 drop into the left eye 4 (four) times daily.  07/22/16  Yes [provider]  Cariprazine HCl (VRAYLAR) 3 MG CAPS Take 3 mg by mouth at bedtime.    [provider]  metFORMIN (GLUCOPHAGE) 1000 MG tablet Take 1 tablet (1,000 mg total) by mouth 2 (two) times daily with a meal. 12/30/15 12/29/16  Norman Herrlich, MD  oxyCODONE-acetaminophen (PERCOCET) 10-325 MG tablet Take 1 tablet by mouth every 4 (four) hours as needed for pain.    [provider]    Inpatient Medications: Scheduled Meds: . albuterol       Continuous Infusions:  PRN Meds:   Allergies:    Allergies  Allergen Reactions  . Heparin Other (See Comments)    Documented HIT under problem list Pt states he's not allergic. " They told me not take it anymore"  . Thorazine [Chlorpromazine Hcl] Other (See Comments)    Body freezes up    Social History:   Social History   Social History  . Marital status: Single    Spouse name: N/A  . Number of children: N/A  . Years of education: 12th grade   Occupational History  . Disability     2/2 schizophrenia   Social History Main Topics  . Smoking status: Former Smoker    Packs/day: 1.00    Years: 30.00    Types: Cigarettes    Quit date: 06/24/2001  . Smokeless tobacco: Never Used  . Alcohol use 0.0 oz/week  . Drug use: Yes    Types: "Crack" cocaine     Comment: h/o cocaine abuse; last use about 2 months ago (10/01/16)  . Sexual activity: Yes   Other Topics Concern  . Not on file   Social History Narrative   Lives in [place] alone, has a [facility] aide that helps with medications 4-5 days a week with medications, helping to clean.    Family History:    Family History  Problem Relation Age of Onset  . CAD Mother 48       deceased  . CAD Sister   . CAD Brother 68       died from MI at age 62yo  . Hypertension Unknown  ROS:  Please see the history of present illness.   ROS  All other ROS reviewed and negative.     Physical Exam/Data:   Vitals:   11/26/16 1115 11/26/16 1130 11/26/16 1145 11/26/16 1230  BP: 110/71 (!) 127/91 125/79 99/75  Pulse:  (!) 41 (!) 41 72  Resp: 15 (!) 24 (!) 29 (!) 22  Temp:      TempSrc:      SpO2:  98% 97% 95%   No intake or output data in the 24 hours ending 11/26/16 1259 There were no vitals filed for this visit. There is no height or weight on file to calculate BMI.  General:  Well nourished, well developed, in no acute distress HEENT: normal Neck: JVP 10-12 cm, but exam difficult Vascular: No carotid bruits Cardiac:  normal S1, S2; RRR; no murmur, exam difficult with breath sounds Lungs:  Nonproductive cough, wheezes and rhonchi throughout, respirations unlabored Abd: soft, nontender, no hepatomegaly  Ext: chronic skin changes present. 1+ chronic edema to knees bilaterally.  Musculoskeletal:  No deformities, BUE and BLE strength normal and equal Skin: warm and dry  Neuro:  CNs 2-12 intact, no focal abnormalities noted Psych:  Normal affect   EKG:  The EKG was personally reviewed and demonstrates:  Sinus with frequent PVCs and fusion complexes (old) Telemetry:  Telemetry was personally reviewed and demonstrates:  Sinus with frequent PVCs  Relevant CV Studies:  Echo 10/02/16: Study Conclusions - Left ventricle: Wall thickness was increased in a pattern of mild   LVH. Systolic function was severely reduced. The estimated   ejection fraction was in the range of 20% to 25%. Features are   consistent with a pseudonormal left ventricular filling pattern,   with concomitant abnormal relaxation and increased filling   pressure (grade 2 diastolic dysfunction).   Myoview 06/25/15: 1. Apical and inferior wall thinning related to diaphragmatic attenuation versus prior infarct (infarct favored). No evidence of reversible ischemia. 2. Left ventricular enlargement with global left ventricular hypokinesis. 3. Left  ventricular ejection fraction 35% 4. Non invasive risk stratification*: High risk.   Right heat cath 09/02/15: Volume status looks good (if not a little low). Cardiac output is depressed. Will continue to titrate meds. Not ideal candidate for advanced therapies of home inotropes. Home today or tomorrow.  RA = 7 RV = 35/10 PA = 37/15 (25) PCW = 10 Fick cardiac output/index = 4.3/1.7 PVR = 3.5 WU Ao sat = 95% PA sat = 58%, 58%   Left/right heart cath 04/27/12: Assessment: 1. Minimal CAD 2. NICM (likely hypertensive in nature) with EF 35-40% 3. Preserved cardiac output    Laboratory Data:  Chemistry Recent Labs Lab 11/26/16 0953  NA 132*  K 4.9  CL 95*  CO2 24  GLUCOSE 455*  BUN 27*  CREATININE 1.47*  CALCIUM 10.2  GFRNONAA 49*  GFRAA 57*  ANIONGAP 13    No results for input(s): PROT, ALBUMIN, AST, ALT, ALKPHOS, BILITOT in the last 168 hours. Hematology Recent Labs Lab 11/26/16 0953  WBC 4.7  RBC 4.93  HGB 15.2  HCT 44.9  MCV 91.1  MCH 30.8  MCHC 33.9  RDW 13.3  PLT 147*   Cardiac EnzymesNo results for input(s): TROPONINI in the last 168 hours.  Recent Labs Lab 11/26/16 1031  TROPIPOC 0.09*    BNPNo results for input(s): BNP, PROBNP in the last 168 hours.  DDimer No results for input(s): DDIMER in the last 168 hours.  Radiology/Studies:  Dg Chest 2  View  Result Date: 11/26/2016 CLINICAL DATA:  Shortness of breath, wheezing and cough for 10 days. EXAM: CHEST  2 VIEW COMPARISON:  PA and lateral chest 10/01/2016 and 12/17/2015. CT chest 04/05/2013. FINDINGS: There is cardiomegaly without edema. Small focus of discoid atelectasis in the lingula is noted. No pneumothorax or pleural effusion. Postoperative change right AC joint is incidentally noted. IMPRESSION: Cardiomegaly without acute disease. Electronically Signed   By: Inge Rise M.D.   On: 11/26/2016 10:28    Assessment and Plan:   1. Elevated troponin: - EKG with PVCs and fusion  complexes - last myoview in 2017 without reversible defects; LHC 2014 without obstructive disease - pt continues to deny chest pain. Nitro paste was applied on arrival. D/C'ed nitro paste and will monitor chest pain. Will continue to trend troponins, but this likely represents demand ischemia in the setting of heart failure. If troponins become positive, will pursue further ischemic workup - continue ASA and crestor   2. Chronic systolic and diastolic heart failure - pt states he is compliant on home meds: coreg, entresto, spironolactone, demadex, but then admitted he missed a morning dose of torsemide two days ago and that his weight is probably up - Suspect mild volume overload.  Will order Lasix 80 mg IV bid x 2 doses then reassess.  - BNP was not drawn, but may be of little utility in the setting of CKD   3. Atrial fibrillation - sinus on telemetry, frequent PVCs, episodes of bigeminy   4. CKD stage II - sCr 1.47, baseline appears 1.25-1.37   He is actively wheezing on exam. This clinical picture seems more consistent with pulmonary etiology vs congestive heart failure exacerbation. His CXR is without edema or congestion. Will restart home diuretic regimen and will follow along.     For questions or updates, please contact [place] Please consult [email or web address] for contact info under Cardiology/STEMI.   Signed, Ledora Bottcher, [place]  11/26/2016 12:59 PM   Patient seen with PA, agree with the above note.  I made adjustments to the note to reflect my thoughts.    He was admitted today with worsening dyspnea, no chest pain.  Thinks weight is up at home.   Exam is difficult for volume, but I think JVP is elevated. He has 1+ chronic edema bilaterally to the knees.  Lung exam is very impressive with diffuse rhonchi.  CXR, however, does not show any acute findings.  ECG shows NSR.   1. Acute on chronic systolic CHF: Nonischemic cardiomyopathy.  Echo in 9/18 with EF 20-25%.  I  think that he is volume overloaded on exam, may have been triggered in setting of acute bronchitis which I think may co-exist.  Exam, of note, is difficult for volume.  - I will write him for Lasix 80 mg IV bid x 2 doses, we will reassess his volume status in the morning.  - Continue home spironolactone, Entresto, and Coreg for now.  2. Acute bronchitis: No PNA on CXR.  Diffuse rhonchi bilaterally.  No h/o COPD and not a current smoker.  Possible viral syndrome.  Afebrile, normal WBCs.  - Per primary service.  3. CKD stage II: Creatinine mildly up from baseline, watch closely with diuresis.  4. Elevated troponin: No chest pain.  Mild troponin elevation, may be due to demand ischemia with volume overload.  Cath in 2014 without obstructive disease.   - Cycle cardiac enzymes to make sure no significant rise.  5. Atrial fibrillation: Paroxysmal.  He is in NSR today.  - Continue Xarelto  We will follow.   Loralie Champagne 11/26/2016 5:11 PM

## 2016-11-26 NOTE — ED Provider Notes (Signed)
[facility] EMERGENCY DEPARTMENT Provider Note   CSN: [id number] Arrival date & time: 11/26/16  3845     History   Chief Complaint Chief Complaint  Patient presents with  . Shortness of Breath    HPI [name] [name] is a 61 y.o. male.  Patient states that he has been having chest pain off and on for couple weeks.   The history is provided by the patient. No language interpreter was used.  Shortness of Breath  This is a recurrent problem. The problem occurs rarely.The current episode started more than 1 week ago. The problem has not changed since onset.Pertinent negatives include no fever, no headaches, no cough, no chest pain, no abdominal pain and no rash. It is unknown what precipitated the problem.    Past Medical History:  Diagnosis Date  . Atrial fibrillation ([place])   . Cancer [facility])    Prostate cancer-bx. 3 weeks ago  . Cataract   . Chronic combined systolic and diastolic CHF (congestive heart failure) (HCC)    a) EF 40-45% per 2D echo (02/2012) with grade 1 DD b)  NICM c) RHC (04/2012): RA: 4, RV 45/3/4, PA 42/9 (24), PCWP 14, Fick CO/CI: 5.2 /2.2, PVR 1.9 WU, PA 62% and 64% d) ECHO (10/2012) EF 40-45%, grade II DD, RV nl  . CKD (chronic kidney disease) stage 2, GFR 60-89 ml/min    BL SCr approximately 1-1.3  . Colitis 05/2009   History of colitis of ascending colon noted on CT abd/pelvis (05/2009), with interval resolution with subsequent CT  . Continuous chronic alcoholism ([place])   . Degenerative lumbar spinal stenosis    s/p L2-3, L3-4, L4-5 laminectomy partial facetectomy, and bilateral foraminotomy  . Diabetes mellitus without complication ([place])    Type II  . Dysrhythmia    A. Fib  . Family history of adverse reaction to anesthesia    "sister, can't go to sleep"  . GERD (gastroesophageal reflux disease)   . H/O cocaine abuse   . Hepatic steatosis    suspected 2/2 alcohol abuse  . Hepatitis C   . History of pancreatitis 01/2011   Admission for acute pancreatitis presumed 2/2 ongoing alcohol abuse- and hypertriglyceridemia  . History of pneumonia   . HIT (heparin-induced thrombocytopenia) ([place])   . Hyperlipidemia   . Hypertension    uncontrolled with medication noncompliance  . Hypertriglyceridemia   . NICM (nonischemic cardiomyopathy) ([place])    a. LHC (04/2012): nl arteries  . PFO (patent foramen ovale) 01/2012   with right to left shunt, noted per TEE in evaluation for source of embolic stroke in 03/6466  . Rhabdomyolysis 02/22/2012   H/O rhabdomyolysis in 01/2012 that was idiopathic, cause never identified  . Schizophrenia, schizo-affective ([place])   . Shortness of breath dyspnea   . Splenic cyst   . Stroke ([place]) 01/2012   Small cerebellar infarcts right greater than left as well as questionable acute left external capsule and caudate nuclear punctate lacunar infarcts noted per MRI (01/2012) - presumed to be embolic likely source PFO with right to left shunt (noted per TEE 01/ 2014)    Patient Active Problem List   Diagnosis Date Noted  . Chest pain 11/26/2016  . Acute exacerbation of CHF (congestive heart failure) ([place]) 10/01/2016  . Acute on chronic combined systolic (congestive) and diastolic (congestive) heart failure ([place]) 10/01/2016  . Diabetes ([place])   . Overgrown toenails 09/10/2015  . Heart failure with reduced ejection fraction, NYHA class III ([place]) 06/07/2015  .  Hepatitis C antibody test positive 06/07/2015  . Paroxysmal atrial fibrillation ([place]) 06/07/2015  . Schizophrenia ([place]) 03/06/2014  . Carpal tunnel syndrome 04/11/2013  . Chronic back pain greater than 3 months duration 07/19/2012  . Gastric ulcer with hemorrhage 06/25/2012  . CKD (chronic kidney disease) stage 2, GFR 60-89 ml/min   . CVA (cerebral infarction) 02/29/2012  . HTN (hypertension) 02/20/2012    Past Surgical History:  Procedure Laterality Date  . ACHILLES TENDON REPAIR Right 2007   "it was torn"  . BACK SURGERY    . CARDIAC  CATHETERIZATION N/A 09/02/2015   Procedure: Right Heart Cath;  Surgeon: Jolaine Artist, MD;  Location: [facility];  Service: Cardiovascular;  Laterality: N/A;  . CATARACT EXTRACTION W/ INTRAOCULAR LENS IMPLANT Right   . ESOPHAGOGASTRODUODENOSCOPY N/A 06/25/2012   Procedure: ESOPHAGOGASTRODUODENOSCOPY (EGD);  Surgeon: Milus Banister, MD;  Location: [facility];  Service: Endoscopy;  Laterality: N/A;  . I&D EXTREMITY  03/20/2011   Procedure: IRRIGATION AND DEBRIDEMENT EXTREMITY;  Surgeon: Kerin Salen, MD;  Location: [facility];  Service: Orthopedics;  Laterality: Left;  I&D LEFT ACHILLIES TENDON  . KNEE ARTHROSCOPY Left   . LUMBAR LAMINECTOMY     L2-3, L3-4, L4-5 laminectomy, partial facetectomy  . RESECTION DISTAL CLAVICAL  09/17/2011   Procedure: RESECTION DISTAL CLAVICAL;  Surgeon: Nita Sells, MD;  Location: [facility];  Service: Orthopedics;  Laterality: Right;  right shoulder arthroscopy with sad and open distal clavicle excision   . ROBOT ASSISTED LAPAROSCOPIC RADICAL PROSTATECTOMY N/A 12/16/2012   Procedure: ROBOTIC ASSISTED LAPAROSCOPIC PROSTATECTOMY ;  Surgeon: Ardis Hughs, MD;  Location: [facility];  Service: Urology;  Laterality: N/A;  . TONSILLECTOMY         Home Medications    Prior to Admission medications   Medication Sig Start Date End Date Taking? Authorizing Provider  B-D UF III MINI PEN NEEDLES 31G X 5 MM MISC 1 each 3 (three) times daily as needed.  11/23/16  Yes [provider]  Blood Glucose Monitoring Suppl (ONE TOUCH ULTRA MINI) w/Device KIT 1 each by Does not apply route as needed. 12/19/15  Yes Minus Liberty, MD  carvedilol (COREG) 6.25 MG tablet take 1 tablet by mouth twice a day with meals 04/30/16  Yes Bensimhon, Shaune Pascal, MD  Insulin Glargine (LANTUS SOLOSTAR) 100 UNIT/ML Solostar Pen Inject 50 Units into the skin daily at 10 pm.   Yes [provider]  insulin lispro (HUMALOG KWIKPEN) 100 UNIT/ML  KiwkPen Inject 18 Units into the skin 3 (three) times daily.   Yes [provider]  ofloxacin (OCUFLOX) 0.3 % ophthalmic solution Place 1 drop into the left eye 4 (four) times daily.  08/25/16  Yes [provider]  ONE TOUCH ULTRA TEST test strip 1 each by Other route as needed (FOR BLOOD TESTING).  11/10/16  Yes [provider]  prednisoLONE acetate (PRED FORTE) 1 % ophthalmic suspension Place 1 drop into the left eye 4 (four) times daily. 08/25/16  Yes [provider]  QUEtiapine (SEROQUEL) 400 MG tablet Take 800 mg by mouth at bedtime.    Yes [provider]  rivaroxaban (XARELTO) 20 MG TABS tablet Take 1 tablet (20 mg total) by mouth daily. 10/01/16  Yes Alphonzo Grieve, MD  rosuvastatin (CRESTOR) 20 MG tablet Take 1 tablet (20 mg total) by mouth daily. 10/20/16  Yes Arbutus Leas, NP  sacubitril-valsartan (ENTRESTO) 97-103 MG Take 1 tablet by mouth 2 (two) times daily.  10/20/16  Yes Arbutus Leas, NP  spironolactone (ALDACTONE) 25 MG tablet Take 0.5 tablets (12.5 mg total) by mouth daily. 10/20/16 01/18/17 Yes Arbutus Leas, NP  torsemide (DEMADEX) 20 MG tablet Take 40 mg (2 tabs) in am and 20 mg (1 tab) in pm. Patient taking differently: Take 20-40 mg by mouth 2 (two) times daily. Take 40 mg (2 tabs) in the morning and 20 mg (1 tab) in the evening 10/20/16  Yes Arbutus Leas, NP  TRAVATAN Z 0.004 % SOLN ophthalmic solution Place 1 drop into the left eye 4 (four) times daily. 07/21/16  Yes [provider]  XIIDRA 5 % SOLN Place 1 drop into the left eye 4 (four) times daily.  07/22/16  Yes [provider]  Cariprazine HCl (VRAYLAR) 3 MG CAPS Take 3 mg by mouth at bedtime.    [provider]  metFORMIN (GLUCOPHAGE) 1000 MG tablet Take 1 tablet (1,000 mg total) by mouth 2 (two) times daily with a meal. 12/30/15 12/29/16  Norman Herrlich, MD  oxyCODONE-acetaminophen (PERCOCET) 10-325 MG tablet Take 1 tablet by mouth every 4 (four) hours as  needed for pain.    [provider]    Family History Family History  Problem Relation Age of Onset  . CAD Mother 39       deceased  . CAD Sister   . CAD Brother 44       died from MI at age 52yo  . Hypertension Unknown     Social History Social History  Substance Use Topics  . Smoking status: Former Smoker    Packs/day: 1.00    Years: 30.00    Types: Cigarettes    Quit date: 06/24/2001  . Smokeless tobacco: Never Used  . Alcohol use 0.0 oz/week     Allergies   Heparin and Thorazine [chlorpromazine hcl]   Review of Systems Review of Systems  Constitutional: Negative for appetite change, fatigue and fever.  HENT: Negative for congestion, ear discharge and sinus pressure.   Eyes: Negative for discharge.  Respiratory: Positive for chest tightness and shortness of breath. Negative for cough.   Cardiovascular: Negative for chest pain.  Gastrointestinal: Negative for abdominal pain and diarrhea.  Genitourinary: Negative for frequency and hematuria.  Musculoskeletal: Negative for back pain.  Skin: Negative for rash.  Neurological: Negative for seizures and headaches.  Psychiatric/Behavioral: Negative for hallucinations.     Physical Exam Updated Vital Signs BP 112/87   Pulse 85   Temp 98.3 F (36.8 C) (Oral)   Resp 18   SpO2 92%   Physical Exam  Constitutional: He is oriented to person, place, and time. He appears well-developed.  HENT:  Head: Normocephalic.  Eyes: Conjunctivae and EOM are normal. No scleral icterus.  Neck: Neck supple. No thyromegaly present.  Cardiovascular: Normal rate and regular rhythm.  Exam reveals no gallop and no friction rub.   No murmur heard. Pulmonary/Chest: No stridor. He has no wheezes. He has no rales. He exhibits no tenderness.  Abdominal: He exhibits no distension. There is no tenderness. There is no rebound.  Musculoskeletal: Normal range of motion. He exhibits no edema.  Lymphadenopathy:    He has no cervical  adenopathy.  Neurological: He is oriented to person, place, and time. He exhibits normal muscle tone. Coordination normal.  Skin: No rash noted. No erythema.  Psychiatric: He has a normal mood and affect. His behavior is normal.     ED Treatments / Results  Labs (all  labs ordered are listed, but only abnormal results are displayed) Labs Reviewed  CBC - Abnormal; Notable for the following:       Result Value   Platelets 147 (*)    All other components within normal limits  BASIC METABOLIC PANEL - Abnormal; Notable for the following:    Sodium 132 (*)    Chloride 95 (*)    Glucose, Bld 455 (*)    BUN 27 (*)    Creatinine, Ser 1.47 (*)    GFR calc non Af Amer 49 (*)    GFR calc Af Amer 57 (*)    All other components within normal limits  I-STAT TROPONIN, ED - Abnormal; Notable for the following:    Troponin i, poc 0.09 (*)    All other components within normal limits    EKG  EKG Interpretation  Date/Time:  Thursday November 26 2016 09:46:06 EDT Ventricular Rate:  90 PR Interval:  212 QRS Duration: 106 QT Interval:  382 QTC Calculation: 467 R Axis:   105 Text Interpretation:  Sinus rhythm with 1st degree A-V block with Premature supraventricular complexes and with frequent Premature ventricular complexes and Fusion complexes Rightward axis Possible Anterior infarct , age undetermined Abnormal ECG Confirmed by Milton Ferguson [phone]) on 11/26/2016 11:06:48 AM Also confirmed by Milton Ferguson [phone])  on 11/26/2016 11:13:07 AM       Radiology Dg Chest 2 View  Result Date: 11/26/2016 CLINICAL DATA:  Shortness of breath, wheezing and cough for 10 days. EXAM: CHEST  2 VIEW COMPARISON:  PA and lateral chest 10/01/2016 and 12/17/2015. CT chest 04/05/2013. FINDINGS: There is cardiomegaly without edema. Small focus of discoid atelectasis in the lingula is noted. No pneumothorax or pleural effusion. Postoperative change right AC joint is incidentally noted. IMPRESSION: Cardiomegaly  without acute disease. Electronically Signed   By: Inge Rise M.D.   On: 11/26/2016 10:28    Procedures Procedures (including critical care time)  Medications Ordered in ED Medications  albuterol (PROVENTIL) (2.5 MG/3ML) 0.083% nebulizer solution (not administered)  albuterol (PROVENTIL) (2.5 MG/3ML) 0.083% nebulizer solution 5 mg (5 mg Nebulization Given 11/26/16 0951)  nitroGLYCERIN (NITROGLYN) 2 % ointment 1 inch (1 inch Topical Given 11/26/16 1115)     Initial Impression / Assessment and Plan / ED Course  I have reviewed the triage vital signs and the nursing notes.  Pertinent labs & imaging results that were available during my care of the patient were reviewed by me and considered in my medical decision making (see chart for details).     Patient with chest pain and mildly elevated troponin.  Patient has history of heart failure.  He will be admitted by his medicine doctors and cardiology will consult  Final Clinical Impressions(s) / ED Diagnoses   Final diagnoses:  Chest pain in adult    New Prescriptions New Prescriptions   No medications on file     Milton Ferguson, MD 11/26/16 1227

## 2016-11-26 NOTE — ED Notes (Signed)
Cards Bensimon Md and PA at bedside

## 2016-11-27 DIAGNOSIS — E785 Hyperlipidemia, unspecified: Secondary | ICD-10-CM | POA: Diagnosis present

## 2016-11-27 DIAGNOSIS — C61 Malignant neoplasm of prostate: Secondary | ICD-10-CM | POA: Diagnosis present

## 2016-11-27 DIAGNOSIS — R9431 Abnormal electrocardiogram [ECG] [EKG]: Secondary | ICD-10-CM | POA: Diagnosis present

## 2016-11-27 DIAGNOSIS — Q211 Atrial septal defect: Secondary | ICD-10-CM | POA: Diagnosis not present

## 2016-11-27 DIAGNOSIS — F209 Schizophrenia, unspecified: Secondary | ICD-10-CM | POA: Diagnosis present

## 2016-11-27 DIAGNOSIS — J9601 Acute respiratory failure with hypoxia: Secondary | ICD-10-CM | POA: Diagnosis not present

## 2016-11-27 DIAGNOSIS — J441 Chronic obstructive pulmonary disease with (acute) exacerbation: Secondary | ICD-10-CM | POA: Diagnosis not present

## 2016-11-27 DIAGNOSIS — R944 Abnormal results of kidney function studies: Secondary | ICD-10-CM | POA: Diagnosis not present

## 2016-11-27 DIAGNOSIS — N179 Acute kidney failure, unspecified: Secondary | ICD-10-CM | POA: Diagnosis not present

## 2016-11-27 DIAGNOSIS — I429 Cardiomyopathy, unspecified: Secondary | ICD-10-CM | POA: Diagnosis present

## 2016-11-27 DIAGNOSIS — J9621 Acute and chronic respiratory failure with hypoxia: Secondary | ICD-10-CM | POA: Diagnosis not present

## 2016-11-27 DIAGNOSIS — I5023 Acute on chronic systolic (congestive) heart failure: Secondary | ICD-10-CM | POA: Diagnosis not present

## 2016-11-27 DIAGNOSIS — I4892 Unspecified atrial flutter: Secondary | ICD-10-CM

## 2016-11-27 DIAGNOSIS — E1165 Type 2 diabetes mellitus with hyperglycemia: Secondary | ICD-10-CM | POA: Diagnosis not present

## 2016-11-27 DIAGNOSIS — J440 Chronic obstructive pulmonary disease with acute lower respiratory infection: Secondary | ICD-10-CM | POA: Diagnosis present

## 2016-11-27 DIAGNOSIS — I5042 Chronic combined systolic (congestive) and diastolic (congestive) heart failure: Secondary | ICD-10-CM | POA: Diagnosis not present

## 2016-11-27 DIAGNOSIS — Z87891 Personal history of nicotine dependence: Secondary | ICD-10-CM | POA: Diagnosis not present

## 2016-11-27 DIAGNOSIS — I5043 Acute on chronic combined systolic (congestive) and diastolic (congestive) heart failure: Secondary | ICD-10-CM | POA: Diagnosis not present

## 2016-11-27 DIAGNOSIS — Z6841 Body Mass Index (BMI) 40.0 and over, adult: Secondary | ICD-10-CM | POA: Diagnosis not present

## 2016-11-27 DIAGNOSIS — J209 Acute bronchitis, unspecified: Secondary | ICD-10-CM | POA: Diagnosis present

## 2016-11-27 DIAGNOSIS — E119 Type 2 diabetes mellitus without complications: Secondary | ICD-10-CM

## 2016-11-27 DIAGNOSIS — I493 Ventricular premature depolarization: Secondary | ICD-10-CM | POA: Diagnosis not present

## 2016-11-27 DIAGNOSIS — E1122 Type 2 diabetes mellitus with diabetic chronic kidney disease: Secondary | ICD-10-CM | POA: Diagnosis not present

## 2016-11-27 DIAGNOSIS — R079 Chest pain, unspecified: Secondary | ICD-10-CM | POA: Diagnosis present

## 2016-11-27 DIAGNOSIS — I440 Atrioventricular block, first degree: Secondary | ICD-10-CM | POA: Diagnosis present

## 2016-11-27 DIAGNOSIS — R0602 Shortness of breath: Secondary | ICD-10-CM | POA: Diagnosis present

## 2016-11-27 DIAGNOSIS — Z9114 Patient's other noncompliance with medication regimen: Secondary | ICD-10-CM | POA: Diagnosis not present

## 2016-11-27 DIAGNOSIS — E669 Obesity, unspecified: Secondary | ICD-10-CM | POA: Diagnosis present

## 2016-11-27 DIAGNOSIS — J449 Chronic obstructive pulmonary disease, unspecified: Secondary | ICD-10-CM | POA: Diagnosis not present

## 2016-11-27 DIAGNOSIS — I130 Hypertensive heart and chronic kidney disease with heart failure and stage 1 through stage 4 chronic kidney disease, or unspecified chronic kidney disease: Secondary | ICD-10-CM | POA: Diagnosis not present

## 2016-11-27 DIAGNOSIS — N183 Chronic kidney disease, stage 3 (moderate): Secondary | ICD-10-CM | POA: Diagnosis not present

## 2016-11-27 DIAGNOSIS — I480 Paroxysmal atrial fibrillation: Secondary | ICD-10-CM | POA: Diagnosis present

## 2016-11-27 DIAGNOSIS — T502X5A Adverse effect of carbonic-anhydrase inhibitors, benzothiadiazides and other diuretics, initial encounter: Secondary | ICD-10-CM | POA: Diagnosis not present

## 2016-11-27 DIAGNOSIS — Z7901 Long term (current) use of anticoagulants: Secondary | ICD-10-CM | POA: Diagnosis not present

## 2016-11-27 DIAGNOSIS — I5033 Acute on chronic diastolic (congestive) heart failure: Secondary | ICD-10-CM | POA: Diagnosis not present

## 2016-11-27 LAB — RESPIRATORY PANEL BY PCR
ADENOVIRUS-RVPPCR: NOT DETECTED
Bordetella pertussis: NOT DETECTED
CORONAVIRUS NL63-RVPPCR: NOT DETECTED
CORONAVIRUS OC43-RVPPCR: NOT DETECTED
Chlamydophila pneumoniae: NOT DETECTED
Coronavirus 229E: NOT DETECTED
Coronavirus HKU1: NOT DETECTED
INFLUENZA A-RVPPCR: NOT DETECTED
Influenza B: NOT DETECTED
METAPNEUMOVIRUS-RVPPCR: NOT DETECTED
MYCOPLASMA PNEUMONIAE-RVPPCR: NOT DETECTED
PARAINFLUENZA VIRUS 1-RVPPCR: NOT DETECTED
PARAINFLUENZA VIRUS 2-RVPPCR: NOT DETECTED
PARAINFLUENZA VIRUS 4-RVPPCR: NOT DETECTED
Parainfluenza Virus 3: NOT DETECTED
Respiratory Syncytial Virus: NOT DETECTED
Rhinovirus / Enterovirus: NOT DETECTED

## 2016-11-27 LAB — GLUCOSE, CAPILLARY
GLUCOSE-CAPILLARY: 285 mg/dL — AB (ref 65–99)
GLUCOSE-CAPILLARY: 407 mg/dL — AB (ref 65–99)
GLUCOSE-CAPILLARY: 449 mg/dL — AB (ref 65–99)
GLUCOSE-CAPILLARY: 472 mg/dL — AB (ref 65–99)
Glucose-Capillary: 427 mg/dL — ABNORMAL HIGH (ref 65–99)
Glucose-Capillary: 504 mg/dL (ref 65–99)
Glucose-Capillary: 477 mg/dL — ABNORMAL HIGH (ref 65–99)
Glucose-Capillary: 360 mg/dL — ABNORMAL HIGH (ref 65–99)
Glucose-Capillary: 330 mg/dL — ABNORMAL HIGH (ref 65–99)
Glucose-Capillary: 436 mg/dL — ABNORMAL HIGH (ref 65–99)
Glucose-Capillary: 430 mg/dL — ABNORMAL HIGH (ref 65–99)
Glucose-Capillary: 467 mg/dL — ABNORMAL HIGH (ref 65–99)
Glucose-Capillary: 365 mg/dL — ABNORMAL HIGH (ref 65–99)
Glucose-Capillary: 419 mg/dL — ABNORMAL HIGH (ref 65–99)

## 2016-11-27 LAB — GLUCOSE, RANDOM: Glucose, Bld: 426 mg/dL — ABNORMAL HIGH (ref 65–99)

## 2016-11-27 LAB — CBC
HCT: 44.8 % (ref 39.0–52.0)
Hemoglobin: 15 g/dL (ref 13.0–17.0)
MCH: 30.1 pg (ref 26.0–34.0)
MCHC: 33.5 g/dL (ref 30.0–36.0)
MCV: 90 fL (ref 78.0–100.0)
Platelets: 132 10*3/uL — ABNORMAL LOW (ref 150–400)
RBC: 4.98 MIL/uL (ref 4.22–5.81)
RDW: 13.2 % (ref 11.5–15.5)
WBC: 5.1 10*3/uL (ref 4.0–10.5)

## 2016-11-27 LAB — BASIC METABOLIC PANEL
Anion gap: 12 (ref 5–15)
BUN: 28 mg/dL — ABNORMAL HIGH (ref 6–20)
CALCIUM: 10.1 mg/dL (ref 8.9–10.3)
CHLORIDE: 96 mmol/L — AB (ref 101–111)
CO2: 24 mmol/L (ref 22–32)
CREATININE: 1.26 mg/dL — AB (ref 0.61–1.24)
GFR calc Af Amer: >60 mL/min (ref >60)
GFR calc non Af Amer: 59 mL/min — ABNORMAL LOW (ref >60)
Glucose, Bld: 465 mg/dL — ABNORMAL HIGH (ref 65–99)
Potassium: 4.5 mmol/L (ref 3.5–5.1)
Sodium: 132 mmol/L — ABNORMAL LOW (ref 135–145)

## 2016-11-27 LAB — TROPONIN I: Troponin I: 0.04 ng/mL (ref <0.03)

## 2016-11-27 MED ORDER — INSULIN ASPART 100 UNIT/ML ~~LOC~~ SOLN: 18.0000 [IU] | Freq: Three times a day (TID) | SUBCUTANEOUS | Status: DC

## 2016-11-27 MED ORDER — INSULIN GLARGINE 100 UNIT/ML ~~LOC~~ SOLN: 50.0000 [IU] | Freq: Every day | SUBCUTANEOUS | Status: DC

## 2016-11-27 MED ORDER — INSULIN REGULAR BOLUS VIA INFUSION
0.0000 [IU] | Freq: Three times a day (TID) | INTRAVENOUS | Status: DC
  Administered 2016-11-27: 6 [IU] via INTRAVENOUS
  Filled 2016-11-27: qty 10

## 2016-11-27 MED ORDER — AZITHROMYCIN 250 MG PO TABS
250.0000 mg | ORAL_TABLET | Freq: Every day | ORAL | Status: DC
  Administered 2016-11-28 – 2016-11-30 (×3): 250 mg via ORAL
  Filled 2016-11-27 (×3): qty 1

## 2016-11-27 MED ORDER — DEXTROSE 50 % IV SOLN: 25.0000 mL | INTRAVENOUS | Status: DC | PRN

## 2016-11-27 MED ORDER — METOLAZONE 5 MG PO TABS
2.5000 mg | ORAL_TABLET | Freq: Once | ORAL | Status: AC
  Administered 2016-11-27: 2.5 mg via ORAL
  Filled 2016-11-27: qty 1

## 2016-11-27 MED ORDER — DEXTROSE-NACL 5-0.45 % IV SOLN: INTRAVENOUS | Status: DC

## 2016-11-27 MED ORDER — AZITHROMYCIN 250 MG PO TABS
500.0000 mg | ORAL_TABLET | Freq: Once | ORAL | Status: AC
  Administered 2016-11-27: 500 mg via ORAL
  Filled 2016-11-27: qty 2

## 2016-11-27 MED ORDER — SODIUM CHLORIDE 0.9 % IV SOLN
INTRAVENOUS | Status: DC
  Administered 2016-11-27: 4.1 [IU]/h via INTRAVENOUS
  Filled 2016-11-27 (×3): qty 1

## 2016-11-27 MED ORDER — PREDNISONE 20 MG PO TABS
40.0000 mg | ORAL_TABLET | Freq: Every day | ORAL | Status: DC
  Administered 2016-11-27 – 2016-11-29 (×3): 40 mg via ORAL
  Filled 2016-11-27 (×3): qty 2

## 2016-11-27 MED ORDER — SODIUM CHLORIDE 0.9 % IV SOLN
INTRAVENOUS | Status: DC
  Administered 2016-11-27: 13:00:00 via INTRAVENOUS

## 2016-11-27 MED ORDER — INSULIN ASPART 100 UNIT/ML ~~LOC~~ SOLN
15.0000 [IU] | Freq: Once | SUBCUTANEOUS | Status: AC
  Administered 2016-11-27: 15 [IU] via SUBCUTANEOUS

## 2016-11-27 MED ORDER — FUROSEMIDE 10 MG/ML IJ SOLN
80.0000 mg | Freq: Two times a day (BID) | INTRAMUSCULAR | Status: DC
  Administered 2016-11-27 – 2016-11-28 (×2): 80 mg via INTRAVENOUS
  Filled 2016-11-27 (×2): qty 8

## 2016-11-27 NOTE — Plan of Care (Signed)
Problem: Pain Managment: Goal: General experience of comfort will improve Outcome: Completed/Met Date Met: 11/27/16 Patient denies pain

## 2016-11-27 NOTE — Progress Notes (Signed)
Blood sugar 449, Gerarda Gunther MD notified. One time order of 15 units ordered. Held 8 units of meal coverage per MD.

## 2016-11-27 NOTE — Progress Notes (Signed)
Inpatient Diabetes Program Recommendations  AACE/ADA: New Consensus Statement on Inpatient Glycemic Control (2015)  Target Ranges:  Prepandial:   less than 140 mg/dL      Peak postprandial:   less than 180 mg/dL (1-2 hours)      Critically ill patients:  140 - 180 mg/dL   Results for [name], [name] (MRN [id number]) as of 11/27/2016 13:36  Ref. Range 11/26/2016 22:45 11/27/2016 07:46 11/27/2016 09:22 11/27/2016 11:44 11/27/2016 13:00  Glucose-Capillary Latest Ref Range: 65 - 99 mg/dL 442 (H) 449 (H) 427 (H) 504 (HH) 472 (H)   Review of Glycemic Control  Diabetes history: DM 2 Outpatient Diabetes medications: Lantus 50 units, Humalog 18 units tid, Metformin 1000 mg BID Current orders for Inpatient glycemic control: IV insulin  Inpatient Diabetes Program Recommendations:    Glucose levels elevated in the 400-500 range. Patient started on IV insulin. Patient was also given PO prednisone 40 mg  Today. Once patient's glucose comes down to inpatient goal, please consider: ordering patient's home dose of Lantus 50 units.  Please also consider Novolog 6 units tid with meals in addition to Novolog Correction scale.  Thanks,  Tama Headings RN, MSN, [facility] Inpatient Diabetes Coordinator Team Pager [phone] (8a-5p)

## 2016-11-27 NOTE — Progress Notes (Signed)
Subjective: Patient reports improvement of shortness of breath, wheezing, and cough. Sitting comfortably in chair this morning able to talk in complete sentences with normal work of breathing. Continues to deny any chest pain, head ache or orthopnea. Discusses starting treatment for possible COPD exacerbation and patient was agreeable. All questions and concerns were addressed.  Objective: Vital signs in last 24 hours: Vitals:   11/26/16 2048 11/27/16 0205 11/27/16 0545 11/27/16 0839  BP: (!) 147/71  (!) 127/93   Pulse: 87 (!) 109 98   Resp: (!) 21 20    Temp: 98 F (36.7 C)  97.8 F (36.6 C)   TempSrc: Oral  Oral   SpO2: 94% 90% 94% 92%  Weight: 133.2 kg (293 lb 11.2 oz)  133.9 kg (295 lb 3.2 oz)   Height: 5\' 11"  (1.803 m)      Weight change:   Intake/Output Summary (Last 24 hours) at 11/27/16 1113 Last data filed at 11/27/16 0000  Gross per 24 hour  Intake              420 ml  Output             1550 ml  Net            -1130 ml   Physical Exam General: Sitting comfortably in chair, no acute distress Cardio: RRR, no murmurs, no JVD,  Lungs: Wheezing bilaterally, no crackles, normal work of breathing Abdomen: soft, non tender, non distended, normal bowel sounds  Skin: mild left lower extremity ankle edema, stasis dermatitis of right lower extremity   Studies/Results: Dg Chest 2 View  Result Date: 11/26/2016 CLINICAL DATA:  Shortness of breath, wheezing and cough for 10 days. EXAM: CHEST  2 VIEW COMPARISON:  PA and lateral chest 10/01/2016 and 12/17/2015. CT chest 04/05/2013. FINDINGS: There is cardiomegaly without edema. Small focus of discoid atelectasis in the lingula is noted. No pneumothorax or pleural effusion. Postoperative change right AC joint is incidentally noted. IMPRESSION: Cardiomegaly without acute disease. Electronically Signed   By: Inge Rise M.D.   On: 11/26/2016 10:28   Medications: I have reviewed the patient's current medications. Scheduled  Meds: . Cariprazine HCl  3 mg Oral QHS  . carvedilol  6.25 mg Oral BID WC  . furosemide  80 mg Intravenous BID  . guaiFENesin  600 mg Oral BID  . insulin aspart  0-15 Units Subcutaneous TID WC  . insulin aspart  0-5 Units Subcutaneous QHS  . insulin aspart  8 Units Subcutaneous TID WC  . insulin glargine  35 Units Subcutaneous QHS  . ipratropium-albuterol  3 mL Nebulization Q6H  . latanoprost  1 drop Left Eye QHS  . Lifitegrast  1 drop Left Eye QID  . metolazone  2.5 mg Oral Once  . ofloxacin  1 drop Left Eye QID  . prednisoLONE acetate  1 drop Left Eye QID  . QUEtiapine  800 mg Oral QHS  . rivaroxaban  20 mg Oral Q supper  . rosuvastatin  20 mg Oral Daily  . sacubitril-valsartan  1 tablet Oral BID  . spironolactone  12.5 mg Oral Daily   Continuous Infusions: PRN Meds:.acetaminophen **OR** acetaminophen, albuterol Assessment/Plan: Active Problems:   SOB (shortness of breath)  61 year old male with a PMHx of CKD stage II, Combined systolic and diastolic heart failure, presented with a 3 day history of progressive dyspnea with exertion in the setting of a 2 week history of wheezing with a productive cough. Patient initially had an elevated troponin  of 0.09 but given his history of mild troponemia, down trending troponins and unremarkable EKG ACS unlikely. Patient endorsed an approximately 15 pack year smoking history but quit about 17 years ago. Given his new onset dyspnea, purulent sputum, smoking history and persistent wheezing will treat as potential COPD exacerbation starting him on steroids and abx  Wheezing/cough -Prednisone 40mg  daily x5days -Azithromycin x5days -Mucinex 600mg  po bid -Albuterol 2.5mg  Q4 prn -Duoneb 50ml Q6  DM type 2 Diabetes is currently uncontrolled with increase current dose to patients home regimen. Will continue to monitor closely given  -Lantus 50units daily pm -Novolog 18 units tid with meals -Sliding scale Novolog    This is a Careers information officer  Note.  The care of the patient was discussed with Dr. Isac Sarna and the assessment and plan formulated with their assistance.  Please see their attached note for official documentation of the daily encounter.   LOS: 0 days   Ronnette Juniper, Medical Student 11/27/2016, 11:13 AM  My pager: [phone]

## 2016-11-27 NOTE — Progress Notes (Signed)
Subjective:  No acute events overnight.  Patient remains in room air and satting well.  He does report ongoing dyspnea and wheezing, but admits to feeling better than yesterday.  He states his cough is also improved however is still productive of thick green sputum.  Denies chest pain, reports no new complaints.  Patient requested oxycodone for back pain.  States he is on chronic oxycodone that he receives through a pain clinic.  Objective:  Vital signs in last 24 hours: Vitals:   11/27/16 0205 11/27/16 0545 11/27/16 0839 11/27/16 1120  BP:  (!) 127/93    Pulse: (!) 109 98    Resp: 20     Temp:  97.8 F (36.6 C)    TempSrc:  Oral    SpO2: 90% 94% 92% 91%  Weight:  295 lb 3.2 oz (133.9 kg)    Height:       General: Pleasant male, obese, well-developed, sitting up in chair no acute distress Cardiac: regular rate and rhythm, nl S1/S2, no murmurs, rubs or gallops  Pulm: Diffuse inspiratory and expiratory wheezes, crackles noted, no increased work of breathing noted Abd: soft, NTND, bowel sounds present  Neuro: A&Ox3, able to move all 4 extremities with no focal deficits noted Ext: warm and well perfused, 1+ peripheral edema bilaterally   Assessment/Plan:  [name] [name] is a 61 y.o. M with history of combined systolic and diastolic heart failure EF 20% 09/2016, pAFib, CKD stage III, T2DM, HTN, HLD who presents to the ED with a 3-day history of progressively worsening shortness of breath associated with productive cough x2 weeks.  He is afebrile, hemodynamically stable, and satting well on room air but does have significant diffuse wheezing on exam and mild lower extremity edema.  Lab work remarkable for [place]. However, patient has troponinemia at baseline.  Will trend troponin but no suspicion of ACS at this time. Concern for respiratory illness and CHF exacerbation.  # Shortness of breath # COPD exacerbation, presumed:  Patient continues to endorse dyspnea and productive  cough of green sputum, though he does report improvement in symptoms this morning.  He is currently satting well on room air but continues to have significant wheezing on exam despite scheduled DuoNeb's, albuterol as needed, and Mucinex.  Troponin negative x3 therefore low suspicion for ACS as the etiology of patient's symptoms.  RVP negative.  Concern for COPD exacerbation as patient has a history of smoking (quit years ago).  There are no PFTs to review at this time.  However will treat as COPD exacerbation given high suspicion and no improvement in respiratory symptoms with ongoing treatment.  Heart failure team following this plan to continue to diurese patient with IV Lasix 80 twice daily and metolazone 2.5 mg x1.  We will continue to monitor I/O's.  -IV Lasix 80 mg twice daily x2 doses + toes on 2.5 mg x1 per cardiology recommendations -BMP in AM in the setting of ongoing IV diuresis -Strict I/Os -Daily weights  -Mucinex 600 mg BID + scheduled duonebs QID PRN  -Start prednisone 40 mg x 5 days -Start azithromycin 500 mg x1 then 250 MG QD x 4 days -D/C droplet precautions as RVP negative   # Chronic combined systolic and diastolic HF EF 09% # Paroxysmal Atrial Fibrillation  # HTN  BNP 20 however this does not exclude a heart failure exacerbation.  Heart failure team following and diuresing as above. -Continue home Entresto, spironolactone, Coreg, and rosuvastatin  -Continue home Xarelto  #  T2DM, uncontrolled: Patient's BG remains elevated in the 400s and difficult to control.  He received extra 8U of short acting insulin overnight.  He also received an additional 15 units of Lantus at this morning as well as 15 units of NovoLog. CBG 504 after receiving additional units of insulin. Therefore decision was made to start patient on insulin drip.  Plan to return to home regimen after CBG below 250. -Insulin drip with CBG monitoring every hour -Lantus 50 units when CBG below 250.  We will  continue insulin drip 2 hours after Lantus of 50 units. -Restart NovoLog 18 units 3 times daily with meals as well as sliding scale once of insulin drip  # CKD stage III: At baseline however we will continue to monitor in the setting of ongoing IV diuresis -Avoid nephrotoxic medications -BMP in AM   # Chronic back pain: Patient requested general oxycodone this morning for history of chronic back pain.  He is unable to provide the name of the pain clinic he goes to for this. Review of [place] controlled substances database reveals that patient has been de-escalated from oxycodone to tramadol.  -We will not prescribe oxycodone -Tramadol x1 if patient complains of pain overnight  # Schizophrenia:  -Continue home Seroquel 800 mg QHS    F: none  E: will continue to monitor and replace as needed N: HH diet   VTE ppx: home Xarelto   Code status: Full code, confirmed on admission   Dispo: Anticipated discharge in approximately 1-2 day(s) pending improvement in respiratory status.   Welford Roche, MD  Internal Medicine PGY-1  P [phone]

## 2016-11-27 NOTE — Progress Notes (Signed)
Pt blood sugar 504. Gerarda Gunther MD notified. MD returned call, informed that pt will be placed on a insulin drip.

## 2016-11-27 NOTE — Progress Notes (Signed)
Advanced Heart Failure Rounding Note  PCP: Aldine Contes, MD Primary Cardiologist: Dr. Haroldine Laws   Subjective:    Weight up 2 pounds, urine output sluggish. Remains SOB at rest currently. Audible wheezing.    Objective:   Weight Range: 295 lb 3.2 oz (133.9 kg) Body mass index is 41.17 kg/m.   Vital Signs:   Temp:  [97.8 F (36.6 C)-98 F (36.7 C)] 97.8 F (36.6 C) (11/02 0545) Pulse Rate:  [41-109] 98 (11/02 0545) Resp:  [15-29] 20 (11/02 0205) BP: (99-169)/(68-154) 127/93 (11/02 0545) SpO2:  [90 %-100 %] 92 % (11/02 0839) Weight:  [293 lb 11.2 oz (133.2 kg)-295 lb 3.2 oz (133.9 kg)] 295 lb 3.2 oz (133.9 kg) (11/02 0545) Last BM Date: 11/25/16  Weight change: Filed Weights   11/26/16 2048 11/27/16 0545  Weight: 293 lb 11.2 oz (133.2 kg) 295 lb 3.2 oz (133.9 kg)    Intake/Output:   Intake/Output Summary (Last 24 hours) at 11/27/16 1046 Last data filed at 11/27/16 0000  Gross per 24 hour  Intake              420 ml  Output             1550 ml  Net            -1130 ml      Physical Exam    General:  Fatigued appearing. HEENT: Normal Neck: Supple. JVP hard to see with body habitus. Carotids 2+ bilat; no bruits. No lymphadenopathy or thyromegaly appreciated. Cor: PMI nondisplaced. Regular rate & rhythm. No rubs, gallops or murmurs. Lungs: Diffuse inspiratory and expiratory wheezing.  Abdomen: Soft, nontender, ++ distended. No hepatosplenomegaly. No bruits or masses. Good bowel sounds. Extremities: No cyanosis, clubbing, rash, 1-2+ chronic woody pedal edema.  Neuro: Alert & orientedx3, cranial nerves grossly intact. moves all 4 extremities w/o difficulty. Affect pleasant   Telemetry   NSR 90, frequent PVC's. - personally reviewed.   EKG    NSR 90, frequent PVC's - personally reviewed.   Labs    CBC  Recent Labs  11/26/16 0953 11/27/16 0329  WBC 4.7 5.1  HGB 15.2 15.0  HCT 44.9 44.8  MCV 91.1 90.0  PLT 147* 119*   Basic Metabolic  Panel  Recent Labs  11/26/16 0953 11/27/16 0329 11/27/16 0818  NA 132* 132*  --   K 4.9 4.5  --   CL 95* 96*  --   CO2 24 24  --   GLUCOSE 455* 465* 426*  BUN 27* 28*  --   CREATININE 1.47* 1.26*  --   CALCIUM 10.2 10.1  --    Liver Function Tests No results for input(s): AST, ALT, ALKPHOS, BILITOT, PROT, ALBUMIN in the last 72 hours. No results for input(s): LIPASE, AMYLASE in the last 72 hours. Cardiac Enzymes  Recent Labs  11/26/16 1740 11/26/16 2140 11/27/16 0329  TROPONINI 0.07* 0.06* 0.04*    BNP: BNP (last 3 results)  Recent Labs  10/01/16 1850 11/26/16 1740  BNP 119.8* 20.7    ProBNP (last 3 results) No results for input(s): PROBNP in the last 8760 hours.   D-Dimer No results for input(s): DDIMER in the last 72 hours. Hemoglobin A1C No results for input(s): HGBA1C in the last 72 hours. Fasting Lipid Panel No results for input(s): CHOL, HDL, LDLCALC, TRIG, CHOLHDL, LDLDIRECT in the last 72 hours. Thyroid Function Tests No results for input(s): TSH, T4TOTAL, T3FREE, THYROIDAB in the last 72 hours.  Invalid input(s): FREET3  Other results:   Imaging     No results found.   Medications:     Scheduled Medications: . Cariprazine HCl  3 mg Oral QHS  . carvedilol  6.25 mg Oral BID WC  . guaiFENesin  600 mg Oral BID  . insulin aspart  0-15 Units Subcutaneous TID WC  . insulin aspart  0-5 Units Subcutaneous QHS  . insulin aspart  8 Units Subcutaneous TID WC  . insulin glargine  35 Units Subcutaneous QHS  . ipratropium-albuterol  3 mL Nebulization Q6H  . latanoprost  1 drop Left Eye QHS  . Lifitegrast  1 drop Left Eye QID  . ofloxacin  1 drop Left Eye QID  . prednisoLONE acetate  1 drop Left Eye QID  . QUEtiapine  800 mg Oral QHS  . rivaroxaban  20 mg Oral Q supper  . rosuvastatin  20 mg Oral Daily  . sacubitril-valsartan  1 tablet Oral BID  . spironolactone  12.5 mg Oral Daily     Infusions:   PRN Medications:  acetaminophen  **OR** acetaminophen, albuterol    Patient Profile   [name] [name] is a 61 y.o. male with a hx of stroke (01/2012), PFO (right to left shunt), schizophrenia, NICM with LHC 04/2012 with normal cors, HTN, HLD, HIT, Hep C, hx of cocaine use, alcoholism, GERD, DMII, CKD stage II, chronic combined systolic and diastolic heart failure, prostrate cancer, and atrial fibrillation. Admitted with volume overload in the setting of medication non compliance.   Assessment/Plan   1. Acute on chronic systolic CHF: Nonischemic cardiomyopathy.  Echo in 9/18 with EF 20-25%.  - NYHA III - Volume overloaded on exam, although some of his issues are likely related to acute bronchitis.  - Continue IV diuresis today. Lasix 80 mg BID. Add metolazone 2.5 mg.  - Continue Spiro 12.5 mg daily. Will not increase with K 4.5  - Continue Entresto 97/103 mg  - Continue Coreg 6.25 mg BID. Can decrease, MD to advise.   2. Acute bronchitis: No PNA on CXR.  Diffuse rhonchi bilaterally.  No h/o COPD and not a current smoker.  Possible viral syndrome.   - Afebrile, normal WBC's.  - Give prn albuterol now.   3. CKD stage II:  - Creatinine improving with diuresis.   4. Elevated troponin:  - Denies chest pain - No CAD by cath in 2014.   5. Atrial fibrillation: - NSR today  - Continue Xarelto.    Length of Stay: [place], NP  11/27/2016, 10:46 AM  Advanced Heart Failure Team Pager [phone] (M-F; 7a - 4p)  Please contact [facility] for night-coverage after hours (4p -7a ) and weekends on [email or web address]  Patient seen and examined with Jettie Booze, NP. We discussed all aspects of the encounter. I agree with the assessment and plan as stated above.   He continues to wheeze. Has mild lower extremity edema but overall volume status doesn't appear that bad. CXR (viewed personally) no edema. I suspect this is more viral illness than CHF. That said, will give one more day IV lasix to dry out his lungs as much as  possible. Watch renal function closely. Continue Xarelto for PAF.  Glori Bickers, MD  2:09 PM

## 2016-11-27 NOTE — Progress Notes (Signed)
Date: 11/27/2016  Patient name: [name] [name]  Medical record number: [id number]  Date of birth: [birth year]   I have seen and evaluated [name] [name] and discussed their care with the Residency Team. [name] [name] is a 61 year old gentleman with combined systolic and diastolic heart failure with an EF of 20% measured in September 2018. He presented with a progressive three-week history of dyspnea, dyspnea on exertion, wheezing, productive cough of purulent sputum, orthopnea, lower extremity edema, and sore throat. He was felt to be volume overloaded and was started on IV furosemide 80 mg twice a day. After one dose, his net negative I's and O's were 1.1 L. He was also felt to have viral URI which was treated symptomatically along with duo nebs.  This morning, he remains dyspneic and feels that he is still wheezing. He states he feels a little bit better but not yet back to baseline. He denies chest pain. He states normally, he can walk 1-1/2 blocks without dyspnea but for the past 3 weeks, he has had to walk slower due to dyspnea.  PMHx, Fam Hx, and/or Soc Hx : Worked in [employer] as teen  Vitals:   11/27/16 0839 11/27/16 1120  BP:    Pulse:    Resp:    Temp:    SpO2: 92% 91%  BP 127/93 HR 98 RR 20 T 97.8 Dry weight reportedly 295, admitted 293, today 295 24 hr I/O -1.1 L Gen : sitting in chair, no overt resp distress, speaking in full sentences without HRRR no MRG L decent air flow but tight with insp and exp wheezing throughout LE + 1 edema  Cr increasing past 10 months, admit 1.47, now 1.26 BNP 20 Trop 0.09 - 0.07 - 0.06 - 0.04  I personally viewed the CXR images and confirmed my reading with the official read. PA & lateral - no abnl  I personally viewed the EKG and confirmed my reading with the official read. RAD, sinus, PVC, no ischemic changes  Assessment and Plan: I have seen and evaluated the patient as outlined above. I agree with the formulated Assessment and  Plan as detailed in the residents' note, with the following changes: [name] [name] is a 61 year old man with combined systolic and diastolic heart failure with a recent EF of 20%. He presented with acute on chronic respiratory failure thought to be secondary to volume overload and a viral respiratory issue causing bronchospasm. Cardiology is diuresing [name] [name] and he is net -1 L. IV lasix 80 twice a day is being continued today. Additionally, he has wheezing on pulmonary auscultation. Although he does not have a formal diagnosis of any obstructive pulmonary disease, like COPD, we will empirically treat him for a COPD exacerbation with antibiotics and oral steroids.  1. Acute on chronic respiratory failure - treat underlying etiologies as below  2. Acute on chronic systolic and diastolic heart failure - cardiology is continuing to diurese the patient with IV furosemide 80 IV twice a day along with metolazone.  His home CHF medications are being continued. Follow I's and O's, weights, creatinine, and CO2.   3. Presumed COPD with acute exacerbation - since he has not significantly improved despite 1 L net diuresis and he has significant wheezing on examination indicating bronchospasm. We will add prednisone 40 by mouth daily for 5 days along with an antibiotic due to the purulent and increased sputum.  4. Acute on chronic kidney disease - creatinine is improving despite diuresis. This might indicate decreased  cardiac output due to pump failure or congestion. Continue to follow.  5. Chronic low back pain - the patient requested that we resume his outpatient oxycodone which he gets from a pain therapy clinic. I looked in the [place] controlled substance database and he had been getting opioids from [facility] [employer]. He had escalated from tramadol to hydrocodone to oxycodone. His last oxycodone prescription was August 29 120 10 mg oxycodone. Then on September 27, he got 120 tramadol. He has not  gotten any controlled substances since. He states he is trying to get into a new pain clinic. He does not appear to be any overt pain today. We will not prescribe oxycodone. If we get called about pain, I would prescribe a when necessary tramadol.   6. Uncontrolled type 2 diabetes insulin-dependent - his most recent A1c was 7.3 in September. We have not been able to get his CBG under control with subcutaneous insulin. Today, he got 15 units of novolog at 8:30 and another 15 at 10:30. This is in addition to increasing doses of lantus. I am hesitant to cont to give doses of novolog so close together as that will result in stacking and eventual hypoglycemia. Therefore, we will transition him to an insulin drip.   Bartholomew Crews, MD 11/2/201812:57 PM

## 2016-11-28 DIAGNOSIS — I493 Ventricular premature depolarization: Secondary | ICD-10-CM

## 2016-11-28 DIAGNOSIS — N179 Acute kidney failure, unspecified: Secondary | ICD-10-CM

## 2016-11-28 LAB — GLUCOSE, CAPILLARY
GLUCOSE-CAPILLARY: 598 mg/dL — AB (ref 65–99)
GLUCOSE-CAPILLARY: 476 mg/dL — AB (ref 65–99)
GLUCOSE-CAPILLARY: 496 mg/dL — AB (ref 65–99)
Glucose-Capillary: 223 mg/dL — ABNORMAL HIGH (ref 65–99)
Glucose-Capillary: 223 mg/dL — ABNORMAL HIGH (ref 65–99)
Glucose-Capillary: 238 mg/dL — ABNORMAL HIGH (ref 65–99)
Glucose-Capillary: 251 mg/dL — ABNORMAL HIGH (ref 65–99)
Glucose-Capillary: 254 mg/dL — ABNORMAL HIGH (ref 65–99)
Glucose-Capillary: 362 mg/dL — ABNORMAL HIGH (ref 65–99)
Glucose-Capillary: 469 mg/dL — ABNORMAL HIGH (ref 65–99)
Glucose-Capillary: 510 mg/dL (ref 65–99)
Glucose-Capillary: 544 mg/dL (ref 65–99)

## 2016-11-28 LAB — BASIC METABOLIC PANEL
ANION GAP: 9 (ref 5–15)
Anion gap: 12 (ref 5–15)
BUN: 35 mg/dL — AB (ref 6–20)
BUN: 45 mg/dL — ABNORMAL HIGH (ref 6–20)
CHLORIDE: 96 mmol/L — AB (ref 101–111)
CO2: 24 mmol/L (ref 22–32)
CO2: 24 mmol/L (ref 22–32)
CREATININE: 1.9 mg/dL — AB (ref 0.61–1.24)
Calcium: 10.4 mg/dL — ABNORMAL HIGH (ref 8.9–10.3)
Calcium: 10.6 mg/dL — ABNORMAL HIGH (ref 8.9–10.3)
Chloride: 93 mmol/L — ABNORMAL LOW (ref 101–111)
Creatinine, Ser: 1.95 mg/dL — ABNORMAL HIGH (ref 0.61–1.24)
GFR calc Af Amer: 42 mL/min — ABNORMAL LOW (ref >60)
GFR, EST AFRICAN AMERICAN: 41 mL/min — AB (ref >60)
GFR, EST NON AFRICAN AMERICAN: 36 mL/min — AB (ref >60)
GFR, EST NON AFRICAN AMERICAN: 35 mL/min — AB (ref >60)
Glucose, Bld: 222 mg/dL — ABNORMAL HIGH (ref 65–99)
Glucose, Bld: 514 mg/dL (ref 65–99)
Potassium: 4.2 mmol/L (ref 3.5–5.1)
Potassium: 5.3 mmol/L — ABNORMAL HIGH (ref 3.5–5.1)
SODIUM: 132 mmol/L — AB (ref 135–145)
Sodium: 126 mmol/L — ABNORMAL LOW (ref 135–145)

## 2016-11-28 MED ORDER — INSULIN GLARGINE 100 UNIT/ML ~~LOC~~ SOLN
50.0000 [IU] | Freq: Once | SUBCUTANEOUS | Status: AC
  Administered 2016-11-28: 50 [IU] via SUBCUTANEOUS
  Filled 2016-11-28: qty 0.5

## 2016-11-28 MED ORDER — INSULIN ASPART 100 UNIT/ML ~~LOC~~ SOLN: 0.0000 [IU] | Freq: Every day | SUBCUTANEOUS | Status: DC

## 2016-11-28 MED ORDER — INSULIN REGULAR BOLUS VIA INFUSION
0.0000 [IU] | Freq: Three times a day (TID) | INTRAVENOUS | Status: DC
  Filled 2016-11-28: qty 10

## 2016-11-28 MED ORDER — INSULIN ASPART 100 UNIT/ML ~~LOC~~ SOLN
18.0000 [IU] | Freq: Three times a day (TID) | SUBCUTANEOUS | Status: DC
  Administered 2016-11-28 (×3): 18 [IU] via SUBCUTANEOUS

## 2016-11-28 MED ORDER — IPRATROPIUM-ALBUTEROL 0.5-2.5 (3) MG/3ML IN SOLN
3.0000 mL | Freq: Three times a day (TID) | RESPIRATORY_TRACT | Status: DC
  Administered 2016-11-28 (×3): 3 mL via RESPIRATORY_TRACT
  Filled 2016-11-28 (×4): qty 3

## 2016-11-28 MED ORDER — INSULIN ASPART 100 UNIT/ML ~~LOC~~ SOLN
0.0000 [IU] | Freq: Three times a day (TID) | SUBCUTANEOUS | Status: DC
  Administered 2016-11-28: 11 [IU] via SUBCUTANEOUS
  Administered 2016-11-28 (×2): 20 [IU] via SUBCUTANEOUS

## 2016-11-28 MED ORDER — DEXTROSE-NACL 5-0.45 % IV SOLN
INTRAVENOUS | Status: DC
  Administered 2016-11-29: 04:00:00 via INTRAVENOUS

## 2016-11-28 MED ORDER — INSULIN ASPART 100 UNIT/ML ~~LOC~~ SOLN: 0.0000 [IU] | Freq: Three times a day (TID) | SUBCUTANEOUS | Status: DC

## 2016-11-28 MED ORDER — DEXTROSE 50 % IV SOLN: 25.0000 mL | INTRAVENOUS | Status: DC | PRN

## 2016-11-28 MED ORDER — INSULIN REGULAR HUMAN 100 UNIT/ML IJ SOLN
INTRAMUSCULAR | Status: DC
  Administered 2016-11-28: 5.4 [IU]/h via INTRAVENOUS
  Administered 2016-11-29: 12.8 [IU]/h via INTRAVENOUS
  Filled 2016-11-28 (×2): qty 1

## 2016-11-28 MED ORDER — INSULIN GLARGINE 100 UNIT/ML ~~LOC~~ SOLN
50.0000 [IU] | Freq: Every day | SUBCUTANEOUS | Status: DC
  Filled 2016-11-28: qty 0.5

## 2016-11-28 MED ORDER — SODIUM CHLORIDE 0.9 % IV SOLN: INTRAVENOUS | Status: DC

## 2016-11-28 MED ORDER — INSULIN ASPART 100 UNIT/ML ~~LOC~~ SOLN: 20.0000 [IU] | Freq: Once | SUBCUTANEOUS | Status: AC

## 2016-11-28 NOTE — Plan of Care (Signed)
Problem: Activity: Goal: Risk for activity intolerance will decrease Outcome: Progressing Up ad lib. Ambulates in room with ease

## 2016-11-28 NOTE — Progress Notes (Signed)
Critical Value called to Dr. Philipp Ovens - Glucose = 514.

## 2016-11-28 NOTE — Progress Notes (Addendum)
Advanced Heart Failure Rounding Note  PCP: Aldine Contes, MD Primary Cardiologist: Dr. Haroldine Laws   Subjective:    Feels much better after steroids added. No longer wheezing. Diuresis sluggish. Creatinine up to 1.9/   Eager to go home   Objective:   Weight Range: 131.2 kg (289 lb 4.8 oz) Body mass index is 40.35 kg/m.   Vital Signs:   Temp:  [97.5 F (36.4 C)-98.9 F (37.2 C)] 97.5 F (36.4 C) (11/03 0607) Pulse Rate:  [86-120] 95 (11/03 0607) Resp:  [18-20] 20 (11/03 0607) BP: (118-136)/(69-88) 118/69 (11/03 0607) SpO2:  [90 %-99 %] 98 % (11/03 0857) Weight:  [131.2 kg (289 lb 4.8 oz)] 131.2 kg (289 lb 4.8 oz) (11/03 0607) Last BM Date: 11/26/16  Weight change: Filed Weights   11/26/16 2048 11/27/16 0545 11/28/16 0607  Weight: 133.2 kg (293 lb 11.2 oz) 133.9 kg (295 lb 3.2 oz) 131.2 kg (289 lb 4.8 oz)    Intake/Output:   Intake/Output Summary (Last 24 hours) at 11/28/16 1239 Last data filed at 11/28/16 0900  Gross per 24 hour  Intake           1394.6 ml  Output              350 ml  Net           1044.6 ml      Physical Exam    General:  Sitting in chair. Smiling  No resp difficulty HEENT: normal Neck: supple. No obvious JVD. Carotids 2+ bilat; no bruits. No lymphadenopathy or thryomegaly appreciated. Cor: PMI nondisplaced. Regular rate & rhythm. No rubs, gallops or murmurs. Lungs: clear no wheezing today Abdomen: soft, nontender, nondistended. No hepatosplenomegaly. No bruits or masses. Good bowel sounds. Extremities: no cyanosis, clubbing, rash, trace woody  edema Neuro: alert & orientedx3, cranial nerves grossly intact. moves all 4 extremities w/o difficulty. Affect pleasant    Telemetry   NSR 90-100, +PVC's. - personally reviewed.   EKG    NSR 90, frequent PVC's - personally reviewed.   Labs    CBC  Recent Labs  11/26/16 0953 11/27/16 0329  WBC 4.7 5.1  HGB 15.2 15.0  HCT 44.9 44.8  MCV 91.1 90.0  PLT 147* 132*   Basic  Metabolic Panel  Recent Labs  11/27/16 0329 11/27/16 0818 11/28/16 0541  NA 132*  --  132*  K 4.5  --  4.2  CL 96*  --  96*  CO2 24  --  24  GLUCOSE 465* 426* 222*  BUN 28*  --  35*  CREATININE 1.26*  --  1.90*  CALCIUM 10.1  --  10.4*   Liver Function Tests No results for input(s): AST, ALT, ALKPHOS, BILITOT, PROT, ALBUMIN in the last 72 hours. No results for input(s): LIPASE, AMYLASE in the last 72 hours. Cardiac Enzymes  Recent Labs  11/26/16 1740 11/26/16 2140 11/27/16 0329  TROPONINI 0.07* 0.06* 0.04*    BNP: BNP (last 3 results)  Recent Labs  10/01/16 1850 11/26/16 1740  BNP 119.8* 20.7    ProBNP (last 3 results) No results for input(s): PROBNP in the last 8760 hours.   D-Dimer No results for input(s): DDIMER in the last 72 hours. Hemoglobin A1C No results for input(s): HGBA1C in the last 72 hours. Fasting Lipid Panel No results for input(s): CHOL, HDL, LDLCALC, TRIG, CHOLHDL, LDLDIRECT in the last 72 hours. Thyroid Function Tests No results for input(s): TSH, T4TOTAL, T3FREE, THYROIDAB in the last 72 hours.  Invalid input(s): FREET3  Other results:   Imaging    No results found.   Medications:     Scheduled Medications: . azithromycin  250 mg Oral Daily  . Cariprazine HCl  3 mg Oral QHS  . carvedilol  6.25 mg Oral BID WC  . guaiFENesin  600 mg Oral BID  . insulin aspart  0-20 Units Subcutaneous TID WC  . insulin aspart  0-5 Units Subcutaneous QHS  . insulin aspart  18 Units Subcutaneous TID WC  . insulin glargine  50 Units Subcutaneous QHS  . ipratropium-albuterol  3 mL Nebulization TID  . latanoprost  1 drop Left Eye QHS  . Lifitegrast  1 drop Left Eye QID  . ofloxacin  1 drop Left Eye QID  . prednisoLONE acetate  1 drop Left Eye QID  . predniSONE  40 mg Oral Q breakfast  . QUEtiapine  800 mg Oral QHS  . rivaroxaban  20 mg Oral Q supper  . rosuvastatin  20 mg Oral Daily  . spironolactone  12.5 mg Oral Daily     Infusions: . sodium chloride Stopped (11/28/16 0319)    PRN Medications: acetaminophen **OR** acetaminophen, albuterol, dextrose    Patient Profile   [name] [name] is a 61 y.o. male with a hx of stroke (01/2012), PFO (right to left shunt), schizophrenia, NICM with LHC 04/2012 with normal cors, HTN, HLD, HIT, Hep C, hx of cocaine use, alcoholism, GERD, DMII, CKD stage II, chronic combined systolic and diastolic heart failure, prostrate cancer, and atrial fibrillation. Admitted with volume overload in the setting of medication non compliance.   Assessment/Plan   1. Acute on chronic systolic CHF: Nonischemic cardiomyopathy.  Echo in 9/18 with EF 20-25%. - Respiratory status much improved with steroids. Suspect major issue was COPD flare and not HF.   - He has been overdiuresed. Agree with holding lasix. Encouraged po intake today. - If creatinine coming down tomorrow can send home from our standpoint on previous home regimen,.  2. Acute hypoxic respiratory failure due to COPD flare: No PNA on CXR.  Diffuse rhonchi bilaterally.  No h/o COPD and not a current smoker.  Possible viral syndrome.   - Afebrile, normal WBC's.  - Much improved with steroids and abx per primary team   3. Acute on CKD stage II:  - Creatinine up with diuresis. Hold lasix today.   4. Elevated troponin:  - Denies chest pain - No CAD by cath in 2014.   5. Atrial fibrillation: - NSR today. Rate controlled  - Continue Xarelto.   6. Frequent PVCs - 15-20 per minute by tele - may be contributing to cardiomyopathy. PVCs may be exacerbated by COPD flare. - will need 48 hour monitor in clinic. iF PVC burden > 10% consider amiodarone to suppress PVCs and see if EF recovers.    Length of Stay: 1  Glori Bickers, MD  11/28/2016, 12:39 PM  Advanced Heart Failure Team Pager [phone] (M-F; 7a - 4p)  Please contact [facility] for night-coverage after hours (4p -7a ) and weekends on [email or web address]

## 2016-11-28 NOTE — Progress Notes (Signed)
   Subjective: Feels well today, breathing has improved. No complaints.   Objective:  Vital signs in last 24 hours: Vitals:   11/27/16 2136 11/28/16 0110 11/28/16 0607 11/28/16 0857  BP: 136/75  118/69   Pulse: 86  95   Resp: 18  20   Temp: 98.9 F (37.2 C)  (!) 97.5 F (36.4 C)   TempSrc: Oral  Oral   SpO2: 96% 95% 99% 98%  Weight:   289 lb 4.8 oz (131.2 kg)   Height:       Physical Exam Constitutional: NAD, appears comfortable Cardiovascular: RRR, no murmurs, rubs, or gallops.  Pulmonary/Chest: Mild expiratory wheezing bilaterally, no crackles. Extremities: Warm and well perfused. Trace edema.   Psychiatric: Normal mood and affect   Assessment/Plan:  [name] [name] is a 61 y.o. M with history of combined systolic anddiastolic heart failure EF 20% 09/2016, pAFib, CKD stage III, T2DM, HTN, HLD who presented to the ED with a 3-day history of progressively worsening SOB and cough x2 weeks. Admitted for COPD and heart failure exacerbation requiring IV diuresis.   COPD Exacerbation: Presumed COPD diagnosis, former chronic smoker. No PFTs on file. Presented with SOB and productive cough x 2 weeks. Found to have diffuse expiratory wheezing on admission. Sating well on RA, CXR negative for consolidation. RVP negative. No improvement with conservative measures. Symptoms now improving with antibiotics and steroids.  -- Continue Azithromycin day 2/5 -- Continue Prednisone 40 mg day 2/5  -- Duonebs scheduled q6h while awake -- Albuterol PRN  -- Mucinex PRN for cough  -- Will need outpatient PFTs once recovered from acute illness   Acute on Chronic Combined Systolic and Diastolic HF (EF 16%): Mildly volume overloaded on admission. Only minimal response thus far to diuretics. Now with AKI. Heart failure team following, appreciate recommendations.  -- S/p AM dose of lasix today, discontinued PM IV dose  -- Monitor renal function  -- Holding home PO diuretics, will resume tomorrow if  renal function improved -- Hold Entresto with AKI (s/p AM dose, hold PM)  -- Continue home Spironolactone, Coreg, and rosuvastatin   Acute on Chronic Renal Dysfunction: Likely over diuresis. Baseline CKD III  -- Holding IV diuretics  -- Repeat AM BMP  Type II DM: CBGs elevated in the 400s in the setting of COPD exacerbation and prednisone. Transiently requiring insulin drip, now on home Lantus 50 daily and Novolog 18 units TID w/ meals. CBG still elevated > 200.  -- Increase SSI to resistant, TID w/ meals + QH -- Continue home Lantus 50  -- Continue home novolog 18 TID AC   Schizophrenia:  -- Home seroquel 800 mg QHS  FEN: Fluid restrict, replete lytes prn, heart health / carb mod VTE ppx: Xarelto  Code Status: FULL   Dispo: Anticipated discharge in approximately 1 day(s).   Velna Ochs, MD 11/28/2016, 9:58 AM Pager: [phone]

## 2016-11-28 NOTE — Plan of Care (Signed)
Problem: Health Behavior/Discharge Planning: Goal: Ability to manage health-related needs will improve Outcome: Progressing Verbalizes understanding of medications as ordered

## 2016-11-29 ENCOUNTER — Other Ambulatory Visit: Payer: Self-pay

## 2016-11-29 DIAGNOSIS — F209 Schizophrenia, unspecified: Secondary | ICD-10-CM

## 2016-11-29 DIAGNOSIS — E1165 Type 2 diabetes mellitus with hyperglycemia: Secondary | ICD-10-CM

## 2016-11-29 DIAGNOSIS — Z794 Long term (current) use of insulin: Secondary | ICD-10-CM

## 2016-11-29 DIAGNOSIS — J449 Chronic obstructive pulmonary disease, unspecified: Secondary | ICD-10-CM

## 2016-11-29 DIAGNOSIS — E1122 Type 2 diabetes mellitus with diabetic chronic kidney disease: Secondary | ICD-10-CM

## 2016-11-29 DIAGNOSIS — N179 Acute kidney failure, unspecified: Secondary | ICD-10-CM

## 2016-11-29 DIAGNOSIS — I5033 Acute on chronic diastolic (congestive) heart failure: Secondary | ICD-10-CM

## 2016-11-29 DIAGNOSIS — Z7901 Long term (current) use of anticoagulants: Secondary | ICD-10-CM

## 2016-11-29 DIAGNOSIS — N183 Chronic kidney disease, stage 3 (moderate): Secondary | ICD-10-CM

## 2016-11-29 DIAGNOSIS — E785 Hyperlipidemia, unspecified: Secondary | ICD-10-CM

## 2016-11-29 DIAGNOSIS — Z79899 Other long term (current) drug therapy: Secondary | ICD-10-CM

## 2016-11-29 DIAGNOSIS — Z87891 Personal history of nicotine dependence: Secondary | ICD-10-CM

## 2016-11-29 DIAGNOSIS — I130 Hypertensive heart and chronic kidney disease with heart failure and stage 1 through stage 4 chronic kidney disease, or unspecified chronic kidney disease: Secondary | ICD-10-CM

## 2016-11-29 DIAGNOSIS — I5042 Chronic combined systolic (congestive) and diastolic (congestive) heart failure: Secondary | ICD-10-CM

## 2016-11-29 DIAGNOSIS — J9621 Acute and chronic respiratory failure with hypoxia: Secondary | ICD-10-CM

## 2016-11-29 LAB — GLUCOSE, CAPILLARY
GLUCOSE-CAPILLARY: 205 mg/dL — AB (ref 65–99)
GLUCOSE-CAPILLARY: 214 mg/dL — AB (ref 65–99)
GLUCOSE-CAPILLARY: 215 mg/dL — AB (ref 65–99)
GLUCOSE-CAPILLARY: 292 mg/dL — AB (ref 65–99)
GLUCOSE-CAPILLARY: 335 mg/dL — AB (ref 65–99)
GLUCOSE-CAPILLARY: 436 mg/dL — AB (ref 65–99)
Glucose-Capillary: 315 mg/dL — ABNORMAL HIGH (ref 65–99)
Glucose-Capillary: 331 mg/dL — ABNORMAL HIGH (ref 65–99)
Glucose-Capillary: 264 mg/dL — ABNORMAL HIGH (ref 65–99)
Glucose-Capillary: 180 mg/dL — ABNORMAL HIGH (ref 65–99)
Glucose-Capillary: 192 mg/dL — ABNORMAL HIGH (ref 65–99)
Glucose-Capillary: 374 mg/dL — ABNORMAL HIGH (ref 65–99)
Glucose-Capillary: 517 mg/dL (ref 65–99)
Glucose-Capillary: 540 mg/dL (ref 65–99)
Glucose-Capillary: 506 mg/dL (ref 65–99)
Glucose-Capillary: 409 mg/dL — ABNORMAL HIGH (ref 65–99)
Glucose-Capillary: 396 mg/dL — ABNORMAL HIGH (ref 65–99)

## 2016-11-29 LAB — BASIC METABOLIC PANEL
Anion gap: 11 (ref 5–15)
BUN: 51 mg/dL — ABNORMAL HIGH (ref 6–20)
CHLORIDE: 95 mmol/L — AB (ref 101–111)
CO2: 24 mmol/L (ref 22–32)
Calcium: 10.5 mg/dL — ABNORMAL HIGH (ref 8.9–10.3)
Creatinine, Ser: 1.77 mg/dL — ABNORMAL HIGH (ref 0.61–1.24)
GFR calc non Af Amer: 39 mL/min — ABNORMAL LOW (ref >60)
GFR, EST AFRICAN AMERICAN: 46 mL/min — AB (ref >60)
Glucose, Bld: 251 mg/dL — ABNORMAL HIGH (ref 65–99)
Potassium: 3.8 mmol/L (ref 3.5–5.1)
Sodium: 130 mmol/L — ABNORMAL LOW (ref 135–145)

## 2016-11-29 LAB — GLUCOSE, RANDOM: Glucose, Bld: 485 mg/dL — ABNORMAL HIGH (ref 65–99)

## 2016-11-29 MED ORDER — INSULIN ASPART 100 UNIT/ML ~~LOC~~ SOLN
0.0000 [IU] | SUBCUTANEOUS | Status: DC
  Administered 2016-11-29: 7 [IU] via SUBCUTANEOUS
  Administered 2016-11-29: 20 [IU] via SUBCUTANEOUS
  Administered 2016-11-29: 4 [IU] via SUBCUTANEOUS
  Administered 2016-11-29: 20 [IU] via SUBCUTANEOUS

## 2016-11-29 MED ORDER — INSULIN REGULAR BOLUS VIA INFUSION
0.0000 [IU] | Freq: Three times a day (TID) | INTRAVENOUS | Status: DC
  Filled 2016-11-29: qty 10

## 2016-11-29 MED ORDER — INSULIN ASPART 100 UNIT/ML ~~LOC~~ SOLN
18.0000 [IU] | Freq: Three times a day (TID) | SUBCUTANEOUS | Status: DC
  Administered 2016-11-29 (×2): 18 [IU] via SUBCUTANEOUS

## 2016-11-29 MED ORDER — INSULIN GLARGINE 100 UNIT/ML ~~LOC~~ SOLN: 50.0000 [IU] | Freq: Every day | SUBCUTANEOUS | Status: DC

## 2016-11-29 MED ORDER — DEXTROSE 50 % IV SOLN: 25.0000 mL | INTRAVENOUS | Status: DC | PRN

## 2016-11-29 MED ORDER — INSULIN GLARGINE 100 UNIT/ML ~~LOC~~ SOLN: 40.0000 [IU] | Freq: Every day | SUBCUTANEOUS | Status: DC

## 2016-11-29 MED ORDER — SODIUM CHLORIDE 0.9 % IV SOLN
INTRAVENOUS | Status: DC
  Administered 2016-11-29: 4.8 [IU]/h via INTRAVENOUS
  Filled 2016-11-29: qty 1

## 2016-11-29 MED ORDER — SODIUM CHLORIDE 0.9 % IV SOLN: INTRAVENOUS | Status: DC

## 2016-11-29 MED ORDER — INSULIN GLARGINE 100 UNIT/ML ~~LOC~~ SOLN
25.0000 [IU] | Freq: Two times a day (BID) | SUBCUTANEOUS | Status: DC
  Administered 2016-11-29: 25 [IU] via SUBCUTANEOUS
  Filled 2016-11-29 (×2): qty 0.25

## 2016-11-29 MED ORDER — IPRATROPIUM-ALBUTEROL 0.5-2.5 (3) MG/3ML IN SOLN
3.0000 mL | Freq: Four times a day (QID) | RESPIRATORY_TRACT | Status: DC | PRN
  Administered 2016-11-29: 3 mL via RESPIRATORY_TRACT

## 2016-11-29 NOTE — Progress Notes (Signed)
   Subjective: Feels well today, breathing has improved. Eager for discharge.   Objective:  Vital signs in last 24 hours: Vitals:   11/28/16 1930 11/28/16 2000 11/29/16 0554 11/29/16 0622  BP: (!) 109/97  (!) 122/94   Pulse:   96   Resp:   (!) 24   Temp:   (!) 97.5 F (36.4 C) 98 F (36.7 C)  TempSrc:   Oral Oral  SpO2:  94% 95%   Weight:   291 lb 3.2 oz (132.1 kg)   Height:       Physical Exam Constitutional: NAD, appears comfortable Cardiovascular: RRR, no murmurs, rubs, or gallops.  Pulmonary/Chest: CTAB, no wheezing  Extremities: Warm and well perfused. Trace edema.   Psychiatric: Normal mood and affect   Assessment/Plan:  [name] [name] is a 61 y.o. M with history of combined systolic anddiastolic heart failure EF 20% 09/2016, pAFib, CKD stage III, T2DM, HTN, HLD who presented to the ED with a 3-day history of progressively worsening SOB and cough x2 weeks. Admitted for COPD and heart failure exacerbation requiring IV diuresis.   COPD Exacerbation: Presumed COPD diagnosis, former chronic smoker. No PFTs on file. Significantly improved with abx and steroids, wheezing has resolved. Breathing at baseline.  -- Continue Azithromycin day 3/5 -- Continue Prednisone 40 mg day 3/5  -- Change duonebs to PRN -- Albuterol PRN  -- Mucinex PRN for cough  -- Will need outpatient PFTs once recovered from acute illness   Type II DM: CBGs elevated > 500 yesterday evening in the setting of prednisone for COPD exacerbation. Has been on and off insulin drip, back on insulin drip over night. CBG down to 200 this AM. Changed lantus to BID dosing, now on SSI-resistant q4h. Will add back meal time coverage. -- Change home lantus 50 qhs -> 25 units BID today  -- Continue SSI-resistant q4h for now -- Restart home novolog 18 TID AC -- DM coordinator consult for recommendation while on prednisone; hopeful discharge tomorrow if AKI improves. 2 days left of prednisone course.   Acute on  Chronic Combined Systolic and Diastolic HF (EF 44%): Mildly volume overloaded on admission. Only minimal response to diuretics. New AKI yesterday likely from overdiuresis. Held lasix yesterday afternoon with improvement in creatinine today, not yet back to baseline. Weight is up 2 lbs but appears euvolemic. Heart failure team following, appreciate recommendations.  -- Continue to hold diuretics today; will resume home torsemide tomorrow if creatinine stable or improved -- Monitor renal function  -- Holding Entresto with AKI, normotensive today -- Continue home Spironolactone, Coreg, and rosuvastatin   Acute on Chronic Renal Dysfunction: Likely over diuresis. Baseline CKD III, creatinine improved today 1.9 -> 1.7 (baseline 1.0 - 1.3).  -- Holding diuretics & entresto  -- Repeat AM BMP -- Likely DC tomorrow   Atrial Fibrillation: Noted to be irregular today on exam but rate controlled < 110. Anticoagulated on xarelto. Asymptomatic.  -- EKG today --> sinus rhythm with PVCs.  -- Cardiology planning for 48 hour outpatient monitor; concern that frequent PVCs may be contributing to cardiomyopathy. Considering amio. Will defer to cards.  -- Continue xarelto  -- Tele monitoring   Schizophrenia:  -- Home seroquel 800 mg QHS  FEN: Fluid restrict, replete lytes prn, heart health / carb mod VTE ppx: Xarelto  Code Status: FULL   Dispo: Anticipated discharge in approximately 1 day(s) pending improvement in CBG and AKI.   Velna Ochs, MD 11/29/2016, 9:34 AM Pager: [phone]

## 2016-11-29 NOTE — Progress Notes (Signed)
Advanced Heart Failure Rounding Note  PCP: Aldine Contes, MD Primary Cardiologist: Dr. Haroldine Laws   Subjective:    Feels much better. Breathing improved with steroids. Lasix on hold and creatinine improving. Eager to go home.   Objective:   Weight Range: 132.1 kg (291 lb 3.2 oz) Body mass index is 40.61 kg/m.   Vital Signs:   Temp:  [97.5 F (36.4 C)-99 F (37.2 C)] 98 F (36.7 C) (11/04 0622) Pulse Rate:  [96-105] 96 (11/04 0554) Resp:  [18-32] 24 (11/04 0554) BP: (99-122)/(84-97) 122/94 (11/04 0600) SpO2:  [91 %-95 %] 95 % (11/04 0554) Weight:  [132.1 kg (291 lb 3.2 oz)] 132.1 kg (291 lb 3.2 oz) (11/04 0554) Last BM Date: 11/27/16  Weight change: Filed Weights   11/27/16 0545 11/28/16 0607 11/29/16 0554  Weight: 133.9 kg (295 lb 3.2 oz) 131.2 kg (289 lb 4.8 oz) 132.1 kg (291 lb 3.2 oz)    Intake/Output:   Intake/Output Summary (Last 24 hours) at 11/29/2016 1148 Last data filed at 11/29/2016 0900 Gross per 24 hour  Intake 320.19 ml  Output 1200 ml  Net -879.81 ml      Physical Exam    General:  Sitting in chair  No resp difficulty HEENT: normal Neck: supple. JVP hard to see due to size  Carotids 2+ bilat; no bruits. No lymphadenopathy or thryomegaly appreciated. Cor: PMI nondisplaced. Regular rate & rhythm. No rubs, gallops or murmurs. Lungs: clear Abdomen: obese soft, nontender, nondistended. No hepatosplenomegaly. No bruits or masses. Good bowel sounds. Extremities: no cyanosis, clubbing, rash, trace woody edema Neuro: alert & orientedx3, cranial nerves grossly intact. moves all 4 extremities w/o difficulty. Affect pleasant  Telemetry   NSR 90-100, +frequent PVCs. - personally reviewed.   EKG    NSR 90, frequent PVC's - personally reviewed.   Labs    CBC Recent Labs    11/27/16 0329  WBC 5.1  HGB 15.0  HCT 44.8  MCV 90.0  PLT 220*   Basic Metabolic Panel Recent Labs    11/28/16 1623 11/29/16 0234  NA 126* 130*  K 5.3* 3.8  CL  93* 95*  CO2 24 24  GLUCOSE 514* 251*  BUN 45* 51*  CREATININE 1.95* 1.77*  CALCIUM 10.6* 10.5*   Liver Function Tests No results for input(s): AST, ALT, ALKPHOS, BILITOT, PROT, ALBUMIN in the last 72 hours. No results for input(s): LIPASE, AMYLASE in the last 72 hours. Cardiac Enzymes Recent Labs    11/26/16 1740 11/26/16 2140 11/27/16 0329  TROPONINI 0.07* 0.06* 0.04*    BNP: BNP (last 3 results) Recent Labs    10/01/16 1850 11/26/16 1740  BNP 119.8* 20.7    ProBNP (last 3 results) No results for input(s): PROBNP in the last 8760 hours.   D-Dimer No results for input(s): DDIMER in the last 72 hours. Hemoglobin A1C No results for input(s): HGBA1C in the last 72 hours. Fasting Lipid Panel No results for input(s): CHOL, HDL, LDLCALC, TRIG, CHOLHDL, LDLDIRECT in the last 72 hours. Thyroid Function Tests No results for input(s): TSH, T4TOTAL, T3FREE, THYROIDAB in the last 72 hours.  Invalid input(s): FREET3  Other results:   Imaging    No results found.   Medications:     Scheduled Medications: . azithromycin  250 mg Oral Daily  . Cariprazine HCl  3 mg Oral QHS  . carvedilol  6.25 mg Oral BID WC  . guaiFENesin  600 mg Oral BID  . insulin aspart  0-20 Units Subcutaneous  Q4H  . insulin aspart  18 Units Subcutaneous TID WC  . insulin glargine  25 Units Subcutaneous BID  . latanoprost  1 drop Left Eye QHS  . Lifitegrast  1 drop Left Eye QID  . ofloxacin  1 drop Left Eye QID  . prednisoLONE acetate  1 drop Left Eye QID  . predniSONE  40 mg Oral Q breakfast  . QUEtiapine  800 mg Oral QHS  . rivaroxaban  20 mg Oral Q supper  . rosuvastatin  20 mg Oral Daily  . spironolactone  12.5 mg Oral Daily    Infusions: . sodium chloride Stopped (11/28/16 0319)    PRN Medications: acetaminophen **OR** acetaminophen, albuterol, dextrose, dextrose, ipratropium-albuterol    Patient Profile   [name] [name] is a 61 y.o. male with a hx of stroke (01/2012),  PFO (right to left shunt), schizophrenia, NICM with LHC 04/2012 with normal cors, HTN, HLD, HIT, Hep C, hx of cocaine use, alcoholism, GERD, DMII, CKD stage II, chronic combined systolic and diastolic heart failure, prostrate cancer, and atrial fibrillation. Admitted with volume overload in the setting of medication non compliance.   Assessment/Plan   1. Acute on chronic systolic CHF: Nonischemic cardiomyopathy.  Echo in 9/18 with EF 20-25%. - Respiratory status much improved with steroids. Suspect major issue was COPD flare and not HF.   - Lasix on hold creatinine improving - He is stable for d/c from our standpoint. Would hold diuretics one more day and can start oral diuretics back tomorrow   2. Acute hypoxic respiratory failure due to COPD flare: No PNA on CXR.  Diffuse rhonchi bilaterally.  No h/o COPD and not a current smoker.  Possible viral syndrome.   - Afebrile, normal WBC's.  - Much improved with steroids and abx per primary team   3. Acute on CKD stage II:  - Creatinine up with diuresis. Now improving off lasix.  - As above. Would hold diuretics one more day then resume home regimen   4. Elevated troponin:  - Denies chest pain - No CAD by cath in 2014.   5. Atrial fibrillation: - NSR today. Rate controlled  - Continue Xarelto.   6. Frequent PVCs - 15-20 per minute by tele - may be contributing to cardiomyopathy. PVCs may be exacerbated by COPD flare. - will need 48 hour monitor in clinic. iF PVC burden > 10% consider amiodarone to suppress PVCs and see if EF recovers.   He is stable for d/c from our standpoint. Would hold diuretics one more day and can start oral diuretics back tomorrow   Resume on home meds on d/c and f/u in HF Clinic. We will sign off.   Length of Stay: 2  Glori Bickers, MD  11/29/2016, 11:48 AM  Advanced Heart Failure Team Pager [phone] (M-F; 7a - 4p)  Please contact [facility] for night-coverage after hours (4p -7a ) and weekends on  [email or web address]

## 2016-11-29 NOTE — Progress Notes (Signed)
CBG = 517. Dr. Maricela Bo notified. SSI given per protocol along with scheduled dose with meals. Stat lab glucose ordered per protocol. Will repeat CBG in 1 hour per MD instructions. No s/s hyperglycemia.

## 2016-11-29 NOTE — Progress Notes (Signed)
Internal Medicine Attending:   I saw and examined the patient. I reviewed the resident's note and I agree with the resident's findings and plan as documented in the resident's note.  Patient feels well today with no new complaints.  Patient was initially admitted for worsening shortness of breath and cough for the last couple of weeks.  This is thought to be likely secondary to a presumed COPD exacerbation.  We will continue with azithromycin and prednisone day 3 of 5 as well as duo nebs as needed.  Patient with no wheezing on exam today.  Patient is also being diuresed for possible acute on chronic combined systolic and diastolic heart failure.  Heart failure team follow-up and recommendations appreciated.  Patient's creatinine had increased to 1.9 on diuresis and has improved to 1.7 today after holding Lasix yesterday.  We will continue to hold Lasix for now and possibly resume home dose in the morning.  Of note, patient had elevated blood glucose levels greater than 500 yesterday and was transiently placed on an insulin drip overnight.  His hard to control diabetes is likely exacerbated by steroids being given for his COPD.  We will continue to monitor his blood glucose levels closely today and possibly discharge home tomorrow.

## 2016-11-29 NOTE — Plan of Care (Signed)
Tolerates PO intake. Monitor CBG. Verbalizes understanding of POC.

## 2016-11-30 ENCOUNTER — Inpatient Hospital Stay (HOSPITAL_COMMUNITY): Admission: RE | Admit: 2016-11-30 | Payer: Self-pay | Source: Ambulatory Visit

## 2016-11-30 LAB — BASIC METABOLIC PANEL
ANION GAP: 8 (ref 5–15)
BUN: 56 mg/dL — AB (ref 6–20)
CALCIUM: 10.3 mg/dL (ref 8.9–10.3)
CO2: 25 mmol/L (ref 22–32)
Chloride: 101 mmol/L (ref 101–111)
Creatinine, Ser: 1.51 mg/dL — ABNORMAL HIGH (ref 0.61–1.24)
GFR calc Af Amer: 55 mL/min — ABNORMAL LOW (ref >60)
GFR, EST NON AFRICAN AMERICAN: 48 mL/min — AB (ref >60)
Glucose, Bld: 117 mg/dL — ABNORMAL HIGH (ref 65–99)
Potassium: 4 mmol/L (ref 3.5–5.1)
Sodium: 134 mmol/L — ABNORMAL LOW (ref 135–145)

## 2016-11-30 LAB — GLUCOSE, CAPILLARY
GLUCOSE-CAPILLARY: 231 mg/dL — AB (ref 65–99)
GLUCOSE-CAPILLARY: 151 mg/dL — AB (ref 65–99)
GLUCOSE-CAPILLARY: 133 mg/dL — AB (ref 65–99)
GLUCOSE-CAPILLARY: 140 mg/dL — AB (ref 65–99)
GLUCOSE-CAPILLARY: 150 mg/dL — AB (ref 65–99)
GLUCOSE-CAPILLARY: 263 mg/dL — AB (ref 65–99)
Glucose-Capillary: 213 mg/dL — ABNORMAL HIGH (ref 65–99)
Glucose-Capillary: 508 mg/dL (ref 65–99)

## 2016-11-30 MED ORDER — INSULIN GLARGINE 100 UNIT/ML ~~LOC~~ SOLN
50.0000 [IU] | Freq: Every day | SUBCUTANEOUS | Status: DC
  Administered 2016-11-30: 50 [IU] via SUBCUTANEOUS
  Filled 2016-11-30: qty 0.5

## 2016-11-30 MED ORDER — TORSEMIDE 20 MG PO TABS
40.0000 mg | ORAL_TABLET | Freq: Every day | ORAL | Status: DC
  Administered 2016-11-30: 40 mg via ORAL
  Filled 2016-11-30: qty 2

## 2016-11-30 MED ORDER — INSULIN ASPART 100 UNIT/ML ~~LOC~~ SOLN
20.0000 [IU] | Freq: Three times a day (TID) | SUBCUTANEOUS | Status: DC
  Administered 2016-11-30 (×2): 20 [IU] via SUBCUTANEOUS

## 2016-11-30 MED ORDER — TORSEMIDE 20 MG PO TABS: 20.0000 mg | ORAL_TABLET | Freq: Every day | ORAL | Status: DC

## 2016-11-30 MED ORDER — INSULIN ASPART 100 UNIT/ML ~~LOC~~ SOLN
0.0000 [IU] | SUBCUTANEOUS | Status: DC
  Administered 2016-11-30: 11 [IU] via SUBCUTANEOUS
  Administered 2016-11-30 (×2): 3 [IU] via SUBCUTANEOUS

## 2016-11-30 MED ORDER — SACUBITRIL-VALSARTAN 97-103 MG PO TABS
1.0000 | ORAL_TABLET | Freq: Two times a day (BID) | ORAL | Status: DC
  Administered 2016-11-30: 1 via ORAL
  Filled 2016-11-30: qty 1

## 2016-11-30 NOTE — Discharge Instructions (Addendum)
You were admitted to [facility] for shortness of breath. Your breathing improved with antibiotics and prednisone. Your shortness of breath could be due to COPD (chronic obstructive pulmonary disease) due to smoking. You will need testing for this as an outpatient. Ask your doctor about ordering a PFT test for you.   Please continue taking your home water pill.  Take 40 mg of torsemide in the morning and 20 mg at night.  Your blood sugar was very elevated when you were in the hospital. Continue your usual dose of Lantus and Humalog at home. Please make sure to check your blood sugar at home 3-4 times a day. Bring your blood sugar meter to your appointment on Thursday, November  8 at 1:45pm. Please call the internal medicine center if you any issues or questions before your appointment.

## 2016-11-30 NOTE — Progress Notes (Signed)
Subjective:  No acute events overnight. Patient states he is feeling better and denies shortness of breath. States he would like to go home. Discussed with patient importance of monitoring BG at home given persistent hyperglycemia during this admission.   Objective:  Vital signs in last 24 hours: Vitals:   11/29/16 1455 11/29/16 1939 11/29/16 2000 11/30/16 0551  BP: 102/80 129/78  121/78  Pulse: 92 86  93  Resp: 17 (!) 24  (!) 26  Temp: 97.7 F (36.5 C) (!) 97.5 F (36.4 C) 98.1 F (36.7 C) (!) 97.3 F (36.3 C)  TempSrc: Oral Oral Oral Oral  SpO2: 95% 94%  95%  Weight:    291 lb 1.6 oz (132 kg)  Height:       General: pleasant, obese male, well-developed, sitting up in chair in no acute distress  Cardiac: regular rate and rhythm, nl S1/S2, no murmurs, rubs or gallops  Pulm: CTAB, no wheezes or crackles, no increased work of breathing  Abd: soft, NTND, bowel sounds present  Neuro: A&Ox3, able to move all four extremities with no focal deficits noted  Ext: warm and well perfused, no peripheral edema noted    Assessment/Plan:  [name] [name] is a 61 y.o. M with history of combined systolic anddiastolic heart failure EF 20% 09/2016, pAFib, CKD stage III, T2DM, HTN, HLD who presented to the ED with a 3-day history of progressively worsening SOB and cough x2 weeks. Admitted for COPD and heart failure exacerbation requiring IV diuresis.   COPD Exacerbation: Presumed COPD diagnosis, former chronic smoker. No PFTs on file. Significantly improved with abx and steroids, wheezing has resolved. Breathing at baseline. Will d/c prednisone given respiratory status back to baseline and severe hyperglycemia. Plan for close [facility] at [facility].  -Continue Azithromycin day 3/5 -D/c prednisone 40 mg day in the setting of persistent hyperglycemia requiring insulin gtt  -Continue duonebs to PRN -Albuterol PRN  -Mucinex PRN for cough  -Will need outpatient PFTs once recovered from acute illness     Type II DM: Required insulin gtt overnight. CBG this AM 140. Will d/c prednisone as above. Plan to monitor BG throughout the day. If appropriate will d/c home with close follow up. Discuss with patient importance of monitoring BG at home. Will need close follow up in clinic after discharge.  -Home lantus 50 qhs -SSI-R -Increase home novolog 18-->20U TID AC  Acute on Chronic Combined Systolic and Diastolic HF (EF 56%): Mildly volume overloaded on admission. s/p IV Lasix 80mg  BID x 2 days. Developed AKI over the weekend with Cr 1.77. Cr 1.25 today. Will restart home torsemide and Entresto per cardiology recommendations.  -Continue to hold diuretics today; will resume home torsemide tomorrow if creatinine stable or improved -Monitor renal function  -Holding Entresto with AKI, normotensive today -Continue home Spironolactone, Coreg, and rosuvastatin   Acute on Chronic Renal Dysfunction: Likely over diuresis. Baseline CKD III, creatinine improved today 1.9 -> 1.7-->1.5 (baseline 1.0 - 1.3).  -Resume home torsemide 40 in AM and 20 in PM   Atrial Fibrillation: Noted to be irregular today on exam but rate controlled < 110. Anticoagulated on xarelto. Asymptomatic. Continues to have frequent PVCs on telemetry.  -Cardiology planning for 48 hour outpatient monitor; concern that frequent PVCs may be contributing to cardiomyopathy. Considering amio. Will defer to cards.  -Continue home xarelto  -Tele monitoring   Schizophrenia:  -- Home seroquel 800 mg QHS  FEN: Fluid restrict, replete lytes prn, heart health / carb mod  VTE ppx: Xarelto  Code Status: FULL    Dispo: Anticipated discharge in approximately today-1day(s).   Welford Roche, MD  Internal Medicine PGY-1  P [phone]

## 2016-11-30 NOTE — Discharge Summary (Signed)
Name: CLEVLAND CORK MRN: [id number] DOB: [birth year] 61 y.o. PCP: Aldine Contes, MD  Date of Admission: 11/26/2016 10:00 AM Date of Discharge: 11/30/2016 Attending Physician: Bartholomew Crews, MD  Discharge Diagnosis: 1. Shortness of breath  2. AKI    Discharge Medications: Allergies as of 11/30/2016      Reactions   Heparin Other (See Comments)   Documented HIT under problem list Pt states he's not allergic. " They told me not take it anymore"   Thorazine [chlorpromazine Hcl] Other (See Comments)   Body freezes up      Medication List    STOP taking these medications   oxyCODONE-acetaminophen 10-325 MG tablet Commonly known as:  PERCOCET     TAKE these medications   B-D UF III MINI PEN NEEDLES 31G X 5 MM Misc Generic drug:  Insulin Pen Needle 1 each 3 (three) times daily as needed.   carvedilol 6.25 MG tablet Commonly known as:  COREG take 1 tablet by mouth twice a day with meals   HUMALOG KWIKPEN 100 UNIT/ML KiwkPen Generic drug:  insulin lispro Inject 18 Units into the skin 3 (three) times daily.   LANTUS SOLOSTAR 100 UNIT/ML Solostar Pen Generic drug:  Insulin Glargine Inject 50 Units into the skin daily at 10 pm.   metFORMIN 1000 MG tablet Commonly known as:  GLUCOPHAGE Take 1 tablet (1,000 mg total) by mouth 2 (two) times daily with a meal.   ofloxacin 0.3 % ophthalmic solution Commonly known as:  OCUFLOX Place 1 drop into the left eye 4 (four) times daily.   ONE TOUCH ULTRA MINI w/Device Kit 1 each by Does not apply route as needed.   ONE TOUCH ULTRA TEST test strip Generic drug:  glucose blood 1 each by Other route as needed (FOR BLOOD TESTING).   prednisoLONE acetate 1 % ophthalmic suspension Commonly known as:  PRED FORTE Place 1 drop into the left eye 4 (four) times daily.   QUEtiapine 400 MG tablet Commonly known as:  SEROQUEL Take 800 mg by mouth at bedtime.   rivaroxaban 20 MG Tabs tablet Commonly known as:   XARELTO Take 1 tablet (20 mg total) by mouth daily.   rosuvastatin 20 MG tablet Commonly known as:  CRESTOR Take 1 tablet (20 mg total) by mouth daily.   sacubitril-valsartan 97-103 MG Commonly known as:  ENTRESTO Take 1 tablet by mouth 2 (two) times daily.   spironolactone 25 MG tablet Commonly known as:  ALDACTONE Take 0.5 tablets (12.5 mg total) by mouth daily.   torsemide 20 MG tablet Commonly known as:  DEMADEX Take 40 mg (2 tabs) in am and 20 mg (1 tab) in pm. What changed:    how much to take  how to take this  when to take this  additional instructions   TRAVATAN Z 0.004 % Soln ophthalmic solution Generic drug:  Travoprost (BAK Free) Place 1 drop into the left eye 4 (four) times daily.   VRAYLAR 3 MG Caps Generic drug:  Cariprazine HCl Take 3 mg by mouth at bedtime.   XIIDRA 5 % Soln Generic drug:  Lifitegrast Place 1 drop into the left eye 4 (four) times daily.       Disposition and follow-up:   Mr.Krystopher A Caspers was discharged from [facility] in Stable condition.  At the hospital follow up visit please address:  1.  Please assess compliance with home Lantus and Humalog. Please review BG meter.   2. Patient will need  outpatient PFTs to evaluate for obstructive disease.   2.  Labs / imaging needed at time of follow-up: BMP   3.  Pending labs/ test needing follow-up: Nlone   Follow-up Appointments: Follow-up Information    [facility] Follow up.   Why:  You have an appointment on Thursday, November 8 at 1:45pm.  Contact information: [address] [place] [facility] Course by problem list:   Katrell Milhorn. Doyel is a 61 y.o. M with history of combined systolic anddiastolic heart failure EF 20% 09/2016, pAFib, CKD stage III, T2DM, HTN, HLD who presented to the ED with a 3-day history of progressively worsening SOB and productive cough x2 weeks. Admitted  for COPD and heart failure exacerbation requiring IV diuresis.   1. COPD Exacerbation:Presumed COPD diagnosis in a former chronic smoker. No PFTs on file.  Patient presented with shortness of breath and productive cough for 2 weeks.  He had diffuse wheezing on exam, but was satting 100% on room air on admission. He was treated with azithromycin x4 days and prednisone times 3 days (due to severe hyperglycemia requiring insulin drip) with resolution shortness of breath and wheezing.  Will need outpatient PFTs for further evaluation of obstructive disease.  Cardiology consulted due to concern for heart failure exacerbation on admission.  Patient appear mildly volume overloaded at the time, but was diuresed with IV Lasix 80 mg Bid x 2 days that resulted in AKI, which improved on day discharge.   2. Type II RW:ERXVQMG required insulin gtt x3 nights after starting prednisone for CBG >500s.  Therefore, prednisone was discontinued on day 3. He was started on his home regimen on discharge of Lantus 50 QHS and Novolog 18 TID with meals.  He has a hospital follow-up appointment scheduled for November 8 at [facility]. Discussed with patient need for BG monitoring at home and asked to bring BG log to hospital follow-up appointment.  3. Acute on Chronic Combined Systolic and Diastolic HF (EF 86%):PYPPJK volume overloaded on admission with lower extremity edema. He was diuresed with IV Lasix '80mg'$  BID x 2 days and lost 4lbs. Weight on discharge was 291 llbs. His dry weight is 295 lbs. Home medications were resumed on discharge day.   4. Acute on Chronic Renal Dysfunction: CKD III with baseline Cr 1-1.3. Cr 1.7 on day prior to discharge likely secondary to overdiuresis. Cr trending down on day of discharge and home diuretics were resumed.   5. Atrial Fibrillation:Home xarelto was continued. He was noted to have frequent PVCs on admission. Cardiology planning to do 48 hour outpatient monitor; as there is concern that  frequent PVCs may be contributing to cardiomyopathy. They are considering starting amiodarone.    6. Schizophrenia: Home seroquel 800 mg QHS was continued.     Discharge Vitals:   BP 121/78 (BP Location: Left Arm)   Pulse 93   Temp (!) 97.3 F (36.3 C) (Oral)   Resp (!) 26   Ht '5\' 11"'$  (1.803 m)   Wt 291 lb 1.6 oz (132 kg)   SpO2 95%   BMI 40.60 kg/m   Pertinent Labs, Studies, and Procedures:   BMP Latest Ref Rng & Units 11/30/2016 11/29/2016 11/29/2016  Glucose 65 - 99 mg/dL 117(H) 485(H) 251(H)  BUN 6 - 20 mg/dL 56(H) - 51(H)  Creatinine 0.61 - 1.24 mg/dL 1.51(H) - 1.77(H)  BUN/Creat Ratio 10 - 24 - - -  Sodium 135 - 145 mmol/L 134(L) - 130(L)  Potassium 3.5 - 5.1 mmol/L 4.0 - 3.8  Chloride 101 - 111 mmol/L 101 - 95(L)  CO2 22 - 32 mmol/L 25 - 24  Calcium 8.9 - 10.3 mg/dL 10.3 - 10.5(H)    CXR:   IMPRESSION: Cardiomegaly without acute disease.      Discharge Instructions: Discharge Instructions    (HEART FAILURE PATIENTS) Call MD:  Anytime you have any of the following symptoms: 1) 3 pound weight gain in 24 hours or 5 pounds in 1 week 2) shortness of breath, with or without a dry hacking cough 3) swelling in the hands, feet or stomach 4) if you have to sleep on extra pillows at night in order to breathe.   Complete by:  As directed    Call MD for:  difficulty breathing, headache or visual disturbances   Complete by:  As directed    Call MD for:  temperature >100.4   Complete by:  As directed    Diet - low sodium heart healthy   Complete by:  As directed    Diet Carb Modified   Complete by:  As directed    Increase activity slowly   Complete by:  As directed       Signed: Welford Roche, MD  Internal Medicine PGY-1  P [phone]

## 2016-11-30 NOTE — Plan of Care (Signed)
Pt ambulates independently without difficulty, non skid socks on, call light and belongings in reach.

## 2016-11-30 NOTE — Progress Notes (Signed)
Pt given discharge instructions. All questions answered. No new prescriptions given.

## 2016-11-30 NOTE — Progress Notes (Signed)
  Date: 11/30/2016  Patient name: [name] [name]  Medical record number: [id number]  Date of birth: Apr 13, 1955   I have seen and evaluated this patient and I have discussed the plan of care with the house staff. Please see their note for complete details. I concur with their findings.  Bartholomew Crews, MD 11/30/2016, 11:29 AM

## 2016-12-03 ENCOUNTER — Ambulatory Visit: Payer: Self-pay

## 2016-12-10 ENCOUNTER — Ambulatory Visit: Payer: Self-pay

## 2016-12-15 ENCOUNTER — Ambulatory Visit: Payer: Self-pay

## 2016-12-23 ENCOUNTER — Other Ambulatory Visit: Payer: Self-pay | Admitting: *Deleted

## 2016-12-23 MED ORDER — BD PEN NEEDLE MINI U/F 31G X 5 MM MISC: 3 refills | Status: AC

## 2016-12-31 ENCOUNTER — Ambulatory Visit: Payer: Self-pay

## 2016-12-31 ENCOUNTER — Encounter: Payer: Self-pay | Admitting: Internal Medicine

## 2017-01-06 ENCOUNTER — Encounter: Payer: Self-pay | Admitting: Internal Medicine

## 2017-01-13 ENCOUNTER — Other Ambulatory Visit: Payer: Self-pay | Admitting: Internal Medicine

## 2017-01-13 DIAGNOSIS — I5020 Unspecified systolic (congestive) heart failure: Secondary | ICD-10-CM

## 2017-01-13 NOTE — Telephone Encounter (Signed)
Refill/ entresto/ spoke w/ pharm/ there are refills and filled by cardiology

## 2017-01-21 ENCOUNTER — Other Ambulatory Visit: Payer: Self-pay

## 2017-01-21 MED ORDER — INSULIN GLARGINE 100 UNIT/ML SOLOSTAR PEN: 50.0000 [IU] | PEN_INJECTOR | Freq: Every day | SUBCUTANEOUS | 3 refills | Status: DC

## 2017-01-21 MED ORDER — INSULIN LISPRO 100 UNIT/ML (KWIKPEN): 18.0000 [IU] | PEN_INJECTOR | Freq: Three times a day (TID) | SUBCUTANEOUS | 3 refills | Status: DC

## 2017-01-21 NOTE — Telephone Encounter (Signed)
B-D UF III MINI PEN NEEDLES 31G X 5 MM MISC   Insulin Glargine (LANTUS SOLOSTAR) 100 UNIT/ML Solostar Pen  insulin lispro (HUMALOG KWIKPEN) 100 UNIT/ML KiwkPen, Refill request @ [email or web address] on [address].

## 2017-02-16 ENCOUNTER — Telehealth: Payer: Self-pay | Admitting: Dietician

## 2017-02-16 DIAGNOSIS — Z794 Long term (current) use of insulin: Principal | ICD-10-CM

## 2017-02-16 DIAGNOSIS — E1100 Type 2 diabetes mellitus with hyperosmolarity without nonketotic hyperglycemic-hyperosmolar coma (NKHHC): Secondary | ICD-10-CM

## 2017-02-16 MED ORDER — ACCU-CHEK GUIDE W/DEVICE KIT: 1.0000 | PACK | Freq: Four times a day (QID) | 1 refills | Status: AC

## 2017-02-16 MED ORDER — GLUCOSE BLOOD VI STRP: ORAL_STRIP | 5 refills | Status: AC

## 2017-02-16 MED ORDER — ACCU-CHEK FASTCLIX LANCETS MISC: 5 refills | Status: AC

## 2017-02-16 NOTE — Telephone Encounter (Signed)
Patient calls saying he has been discharged from our practice and that we can help him until 02/25/17. He asks for a new meter to monitor his blood sugar  and supplies to be sent to [facility] on [address] for testing his blood sugar 4 times a day. He confirms that he takes insulin 4 times a day.

## 2017-03-09 ENCOUNTER — Encounter: Payer: Self-pay | Admitting: Internal Medicine

## 2017-04-15 ENCOUNTER — Other Ambulatory Visit: Payer: Self-pay

## 2017-04-15 NOTE — Telephone Encounter (Signed)
insulin lispro (HUMALOG KWIKPEN) 100 UNIT/ML KiwkPen, is not covered by the insurance per patient. Please call pt back.

## 2017-04-15 NOTE — Telephone Encounter (Signed)
Pt was dismissed - informed to call his new PCP per front office.

## 2017-05-12 ENCOUNTER — Inpatient Hospital Stay (HOSPITAL_COMMUNITY): Payer: Medicare Other

## 2017-05-12 ENCOUNTER — Emergency Department (HOSPITAL_COMMUNITY): Payer: Medicare Other

## 2017-05-12 ENCOUNTER — Other Ambulatory Visit: Payer: Self-pay

## 2017-05-12 ENCOUNTER — Inpatient Hospital Stay (HOSPITAL_COMMUNITY)
Admission: EM | Admit: 2017-05-12 | Discharge: 2017-05-14 | DRG: 637 | Disposition: A | Discharge status: Home / Self Care | Payer: Medicare Other | Attending: Internal Medicine | Admitting: Internal Medicine

## 2017-05-12 ENCOUNTER — Encounter (HOSPITAL_COMMUNITY): Payer: Self-pay | Admitting: Emergency Medicine

## 2017-05-12 DIAGNOSIS — F259 Schizoaffective disorder, unspecified: Secondary | ICD-10-CM | POA: Diagnosis present

## 2017-05-12 DIAGNOSIS — R0602 Shortness of breath: Secondary | ICD-10-CM

## 2017-05-12 DIAGNOSIS — I5042 Chronic combined systolic (congestive) and diastolic (congestive) heart failure: Secondary | ICD-10-CM

## 2017-05-12 DIAGNOSIS — G8314 Monoplegia of lower limb affecting left nondominant side: Secondary | ICD-10-CM | POA: Diagnosis present

## 2017-05-12 DIAGNOSIS — E7251 Non-ketotic hyperglycinemia: Secondary | ICD-10-CM | POA: Diagnosis present

## 2017-05-12 DIAGNOSIS — Z961 Presence of intraocular lens: Secondary | ICD-10-CM | POA: Diagnosis present

## 2017-05-12 DIAGNOSIS — K760 Fatty (change of) liver, not elsewhere classified: Secondary | ICD-10-CM | POA: Diagnosis present

## 2017-05-12 DIAGNOSIS — E876 Hypokalemia: Secondary | ICD-10-CM | POA: Diagnosis present

## 2017-05-12 DIAGNOSIS — Z794 Long term (current) use of insulin: Secondary | ICD-10-CM | POA: Diagnosis not present

## 2017-05-12 DIAGNOSIS — H538 Other visual disturbances: Secondary | ICD-10-CM | POA: Diagnosis present

## 2017-05-12 DIAGNOSIS — E1122 Type 2 diabetes mellitus with diabetic chronic kidney disease: Secondary | ICD-10-CM | POA: Diagnosis present

## 2017-05-12 DIAGNOSIS — R531 Weakness: Secondary | ICD-10-CM

## 2017-05-12 DIAGNOSIS — F1020 Alcohol dependence, uncomplicated: Secondary | ICD-10-CM | POA: Diagnosis present

## 2017-05-12 DIAGNOSIS — I959 Hypotension, unspecified: Secondary | ICD-10-CM | POA: Diagnosis not present

## 2017-05-12 DIAGNOSIS — R748 Abnormal levels of other serum enzymes: Secondary | ICD-10-CM | POA: Diagnosis not present

## 2017-05-12 DIAGNOSIS — Z7901 Long term (current) use of anticoagulants: Secondary | ICD-10-CM

## 2017-05-12 DIAGNOSIS — Z8673 Personal history of transient ischemic attack (TIA), and cerebral infarction without residual deficits: Secondary | ICD-10-CM

## 2017-05-12 DIAGNOSIS — I480 Paroxysmal atrial fibrillation: Secondary | ICD-10-CM

## 2017-05-12 DIAGNOSIS — R29818 Other symptoms and signs involving the nervous system: Secondary | ICD-10-CM

## 2017-05-12 DIAGNOSIS — I130 Hypertensive heart and chronic kidney disease with heart failure and stage 1 through stage 4 chronic kidney disease, or unspecified chronic kidney disease: Secondary | ICD-10-CM | POA: Diagnosis present

## 2017-05-12 DIAGNOSIS — K219 Gastro-esophageal reflux disease without esophagitis: Secondary | ICD-10-CM | POA: Diagnosis present

## 2017-05-12 DIAGNOSIS — E729 Disorder of amino-acid metabolism, unspecified: Secondary | ICD-10-CM | POA: Diagnosis present

## 2017-05-12 DIAGNOSIS — Z9119 Patient's noncompliance with other medical treatment and regimen: Secondary | ICD-10-CM

## 2017-05-12 DIAGNOSIS — I639 Cerebral infarction, unspecified: Secondary | ICD-10-CM | POA: Diagnosis present

## 2017-05-12 DIAGNOSIS — B1920 Unspecified viral hepatitis C without hepatic coma: Secondary | ICD-10-CM | POA: Diagnosis present

## 2017-05-12 DIAGNOSIS — J449 Chronic obstructive pulmonary disease, unspecified: Secondary | ICD-10-CM | POA: Diagnosis present

## 2017-05-12 DIAGNOSIS — Q211 Atrial septal defect: Secondary | ICD-10-CM | POA: Diagnosis not present

## 2017-05-12 DIAGNOSIS — N182 Chronic kidney disease, stage 2 (mild): Secondary | ICD-10-CM

## 2017-05-12 DIAGNOSIS — I10 Essential (primary) hypertension: Secondary | ICD-10-CM | POA: Diagnosis not present

## 2017-05-12 DIAGNOSIS — R778 Other specified abnormalities of plasma proteins: Secondary | ICD-10-CM | POA: Diagnosis present

## 2017-05-12 DIAGNOSIS — R7989 Other specified abnormal findings of blood chemistry: Secondary | ICD-10-CM | POA: Diagnosis present

## 2017-05-12 DIAGNOSIS — E1100 Type 2 diabetes mellitus with hyperosmolarity without nonketotic hyperglycemic-hyperosmolar coma (NKHHC): Principal | ICD-10-CM

## 2017-05-12 DIAGNOSIS — I5020 Unspecified systolic (congestive) heart failure: Secondary | ICD-10-CM

## 2017-05-12 DIAGNOSIS — F1410 Cocaine abuse, uncomplicated: Secondary | ICD-10-CM | POA: Diagnosis present

## 2017-05-12 DIAGNOSIS — Z9841 Cataract extraction status, right eye: Secondary | ICD-10-CM

## 2017-05-12 DIAGNOSIS — F209 Schizophrenia, unspecified: Secondary | ICD-10-CM | POA: Diagnosis present

## 2017-05-12 DIAGNOSIS — E860 Dehydration: Secondary | ICD-10-CM

## 2017-05-12 DIAGNOSIS — D734 Cyst of spleen: Secondary | ICD-10-CM | POA: Diagnosis present

## 2017-05-12 DIAGNOSIS — Z9079 Acquired absence of other genital organ(s): Secondary | ICD-10-CM

## 2017-05-12 DIAGNOSIS — M545 Low back pain: Secondary | ICD-10-CM | POA: Diagnosis present

## 2017-05-12 DIAGNOSIS — E781 Pure hyperglyceridemia: Secondary | ICD-10-CM | POA: Diagnosis present

## 2017-05-12 DIAGNOSIS — N183 Chronic kidney disease, stage 3 (moderate): Secondary | ICD-10-CM | POA: Diagnosis present

## 2017-05-12 DIAGNOSIS — Z8546 Personal history of malignant neoplasm of prostate: Secondary | ICD-10-CM

## 2017-05-12 DIAGNOSIS — Z8249 Family history of ischemic heart disease and other diseases of the circulatory system: Secondary | ICD-10-CM

## 2017-05-12 DIAGNOSIS — Z888 Allergy status to other drugs, medicaments and biological substances status: Secondary | ICD-10-CM

## 2017-05-12 DIAGNOSIS — T383X6A Underdosing of insulin and oral hypoglycemic [antidiabetic] drugs, initial encounter: Secondary | ICD-10-CM | POA: Diagnosis present

## 2017-05-12 DIAGNOSIS — Z87891 Personal history of nicotine dependence: Secondary | ICD-10-CM

## 2017-05-12 DIAGNOSIS — Z79899 Other long term (current) drug therapy: Secondary | ICD-10-CM

## 2017-05-12 DIAGNOSIS — Z9842 Cataract extraction status, left eye: Secondary | ICD-10-CM

## 2017-05-12 LAB — HEPATIC FUNCTION PANEL
ALT: 22 U/L (ref 17–63)
AST: 26 U/L (ref 15–41)
Albumin: 3.9 g/dL (ref 3.5–5.0)
Alkaline Phosphatase: 104 U/L (ref 38–126)
BILIRUBIN DIRECT: 0.4 mg/dL (ref 0.1–0.5)
BILIRUBIN INDIRECT: 1 mg/dL — AB (ref 0.3–0.9)
BILIRUBIN TOTAL: 1.4 mg/dL — AB (ref 0.3–1.2)
Total Protein: 7.4 g/dL (ref 6.5–8.1)

## 2017-05-12 LAB — RAPID URINE DRUG SCREEN, HOSP PERFORMED
Amphetamines: NOT DETECTED
BARBITURATES: NOT DETECTED
BENZODIAZEPINES: NOT DETECTED
Cocaine: NOT DETECTED
Opiates: NOT DETECTED
Tetrahydrocannabinol: NOT DETECTED

## 2017-05-12 LAB — URINALYSIS, ROUTINE W REFLEX MICROSCOPIC
BILIRUBIN URINE: NEGATIVE
Bacteria, UA: NONE SEEN
Ketones, ur: NEGATIVE mg/dL
Leukocytes, UA: NEGATIVE
NITRITE: NEGATIVE
Protein, ur: NEGATIVE mg/dL
RBC / HPF: NONE SEEN RBC/hpf (ref 0–5)
SPECIFIC GRAVITY, URINE: 1.015 (ref 1.005–1.030)
Squamous Epithelial / LPF: NONE SEEN
pH: 5 (ref 5.0–8.0)

## 2017-05-12 LAB — BASIC METABOLIC PANEL
ANION GAP: 16 — AB (ref 5–15)
ANION GAP: 11 (ref 5–15)
BUN: 25 mg/dL — ABNORMAL HIGH (ref 6–20)
BUN: 22 mg/dL — AB (ref 6–20)
CALCIUM: 10.7 mg/dL — AB (ref 8.9–10.3)
CO2: 18 mmol/L — ABNORMAL LOW (ref 22–32)
CO2: 24 mmol/L (ref 22–32)
CREATININE: 1.41 mg/dL — AB (ref 0.61–1.24)
Calcium: 9.8 mg/dL (ref 8.9–10.3)
Chloride: 86 mmol/L — ABNORMAL LOW (ref 101–111)
Chloride: 96 mmol/L — ABNORMAL LOW (ref 101–111)
Creatinine, Ser: 1.41 mg/dL — ABNORMAL HIGH (ref 0.61–1.24)
GFR calc Af Amer: 60 mL/min — ABNORMAL LOW (ref >60)
GFR, EST AFRICAN AMERICAN: 60 mL/min — AB (ref >60)
GFR, EST NON AFRICAN AMERICAN: 52 mL/min — AB (ref >60)
GFR, EST NON AFRICAN AMERICAN: 52 mL/min — AB (ref >60)
GLUCOSE: 610 mg/dL — AB (ref 65–99)
Glucose, Bld: 1021 mg/dL (ref 65–99)
POTASSIUM: 4 mmol/L (ref 3.5–5.1)
Potassium: 5.3 mmol/L — ABNORMAL HIGH (ref 3.5–5.1)
Sodium: 120 mmol/L — ABNORMAL LOW (ref 135–145)
Sodium: 131 mmol/L — ABNORMAL LOW (ref 135–145)

## 2017-05-12 LAB — CBC
HCT: 50.8 % (ref 39.0–52.0)
HEMOGLOBIN: 18 g/dL — AB (ref 13.0–17.0)
MCH: 29.4 pg (ref 26.0–34.0)
MCHC: 35.4 g/dL (ref 30.0–36.0)
MCV: 82.9 fL (ref 78.0–100.0)
PLATELETS: 193 10*3/uL (ref 150–400)
RBC: 6.13 MIL/uL — AB (ref 4.22–5.81)
WBC: 4.7 10*3/uL (ref 4.0–10.5)

## 2017-05-12 LAB — BLOOD GAS, VENOUS
ACID-BASE DEFICIT: 0.8 mmol/L (ref 0.0–2.0)
BICARBONATE: 24.6 mmol/L (ref 20.0–28.0)
O2 SAT: 74.1 %
PCO2 VEN: 45.5 mmHg (ref 44.0–60.0)
Patient temperature: 98.6
pH, Ven: 7.353 (ref 7.250–7.430)
pO2, Ven: 42.4 mmHg (ref 32.0–45.0)

## 2017-05-12 LAB — CBG MONITORING, ED
GLUCOSE-CAPILLARY: 264 mg/dL — AB (ref 65–99)
GLUCOSE-CAPILLARY: 231 mg/dL — AB (ref 65–99)
Glucose-Capillary: 356 mg/dL — ABNORMAL HIGH (ref 65–99)

## 2017-05-12 LAB — TROPONIN I
TROPONIN I: 0.1 ng/mL — AB (ref <0.03)
Troponin I: 0.08 ng/mL (ref <0.03)

## 2017-05-12 LAB — ETHANOL: Alcohol, Ethyl (B): <10 mg/dL (ref <10)

## 2017-05-12 LAB — MAGNESIUM: MAGNESIUM: 2.2 mg/dL (ref 1.7–2.4)

## 2017-05-12 LAB — PHOSPHORUS: PHOSPHORUS: 2.4 mg/dL — AB (ref 2.5–4.6)

## 2017-05-12 MED ORDER — SODIUM CHLORIDE 0.9 % IV BOLUS
1000.0000 mL | Freq: Once | INTRAVENOUS | Status: AC
  Administered 2017-05-12: 1000 mL via INTRAVENOUS

## 2017-05-12 MED ORDER — SODIUM CHLORIDE 0.9 % IV SOLN
INTRAVENOUS | Status: DC
  Administered 2017-05-12: 5.4 [IU]/h via INTRAVENOUS
  Filled 2017-05-12: qty 1

## 2017-05-12 MED ORDER — DEXTROSE-NACL 5-0.45 % IV SOLN
INTRAVENOUS | Status: DC
  Administered 2017-05-12: 23:00:00 via INTRAVENOUS

## 2017-05-12 MED ORDER — SODIUM CHLORIDE 0.9 % IV SOLN
INTRAVENOUS | Status: DC
  Administered 2017-05-12: 19:00:00 via INTRAVENOUS

## 2017-05-12 NOTE — ED Notes (Signed)
ED Provider at bedside. 

## 2017-05-12 NOTE — ED Notes (Signed)
Date and time results received: 05/12/17 1645 (use smartphrase ".now" to insert current time)  Test: Troponin Critical Value: 0.08  Name of Provider Notified: Antionette Poles.  Orders Received? Or Actions Taken?:

## 2017-05-12 NOTE — ED Notes (Signed)
MD paged about admit status

## 2017-05-12 NOTE — H&P (Signed)
[name] [name]:[id number] DOB: 26-Jul-1955 DOA: 05/12/2017     PCP: none used to be seen by teaching service but was discharged from their service. Bc he missed too many appointments.  Outpatient Specialists:  CARDS: Dr. Haroldine Laws   Patient arrived to ER on 05/12/17 at 1432  Patient coming from:    home Lives alone,       Chief Complaint:  Chief Complaint  Patient presents with  . Hyperglycemia    HPI: [name] [name] is a 62 y.o. male with medical history significant of DM2, CHF 20-25%. A.fib, schizophrenia, CKD stage III, HTN, HLD, COPD presumed, hx of EtOH and cocaine abuse      Presented with   generalized weakness elevated blood sugar for the past 2 weeks since he ran out of all of his regular insulin and have not been able to establish follow-up for refills. Started taody at 12 pm  has had left knee/lower extremity weakness.  No associated upper extremity weakness or facial droop, no slurred speech otherwise denies any chest pain lightheadedness and syncope fevers or chills. Reports he has been urinating and drinking more than usual.  Reports he has been taking all his other medications except for insulin sometimes he has been missing his Seroquel. Patient called EMS was given 500 normal saline on arrival. Patient reports he has an aide at home to help him with his medications. Reports hx of CVA but no chronic extremity weakness.  Reports occasionally missing doses of his home medication.   Regarding pertinent Chronic problems: hx of DM 2 on Lantus and Metfromin  hx of A.fib supposed to be on Xarelto. Hx of systolic CHF (last EF 87-68%),  followed by cardiology on Entresto spironolactone and torsemide Last admission was in November 2018 for COPD exacerbation While in ER: BG 1021 Fluid resuscitate and started glucose stabilizer   Following Medications were ordered in ER: Medications  dextrose 5 %-0.45 % sodium chloride infusion (has no administration in time  range)  insulin regular (NOVOLIN R,HUMULIN R) 100 Units in sodium chloride 0.9 % 100 mL (1 Units/mL) infusion (10.8 Units/hr Intravenous Rate/Dose Change 05/12/17 1904)  sodium chloride 0.9 % bolus 1,000 mL (0 mLs Intravenous Stopped 05/12/17 1900)    And  sodium chloride 0.9 % bolus 1,000 mL (0 mLs Intravenous Stopped 05/12/17 1901)    And  0.9 %  sodium chloride infusion ( Intravenous New Bag/Given 05/12/17 1915)    Significant initial  Findings: Abnormal Labs Reviewed  BASIC METABOLIC PANEL - Abnormal; Notable for the following components:      Result Value   Sodium 120 (*)    Potassium 5.3 (*)    Chloride 86 (*)    CO2 18 (*)    Glucose, Bld 1,021 (*)    BUN 25 (*)    Creatinine, Ser 1.41 (*)    Calcium 10.7 (*)    GFR calc non Af Amer 52 (*)    GFR calc Af Amer 60 (*)    Anion gap 16 (*)    All other components within normal limits  CBC - Abnormal; Notable for the following components:   RBC 6.13 (*)    Hemoglobin 18.0 (*)    All other components within normal limits  URINALYSIS, ROUTINE W REFLEX MICROSCOPIC - Abnormal; Notable for the following components:   Color, Urine STRAW (*)    Glucose, UA >=500 (*)    Hgb urine dipstick SMALL (*)    All other components within  normal limits  TROPONIN I - Abnormal; Notable for the following components:   Troponin I 0.08 (*)    All other components within normal limits  CBG MONITORING, ED - Abnormal; Notable for the following components:   Glucose-Capillary >600 (*)    All other components within normal limits  CBG MONITORING, ED - Abnormal; Notable for the following components:   Glucose-Capillary >600 (*)    All other components within normal limits  CBG MONITORING, ED - Abnormal; Notable for the following components:   Glucose-Capillary >600 (*)    All other components within normal limits      Cr    Stable,  Lab Results  Component Value Date   CREATININE 1.41 (H) 05/12/2017   CREATININE 1.51 (H) 11/30/2016   CREATININE  1.77 (H) 11/29/2016     WBC 4.7   HG/HCT  Up from baseline see below    Component Value Date/Time   HGB 18.0 (H) 05/12/2017 1547   HCT 50.8 05/12/2017 1547    VBG 7.353/45.5  BNP (last 3 results) Recent Labs    10/01/16 1850 11/26/16 1740  BNP 119.8* 20.7    Lactic Acid, Venous    Component Value Date/Time   LATICACIDVEN 1.3 05/11/2013 0240      UA   no evidence of UTI   CT HEAD  NON acute  CXR -   NON acute  ECG:  Personally reviewed by me showing: HR : 90 Rhythm:  NSR    no evidence of ischemic changes QTC 472      ED Triage Vitals  Enc Vitals Group     BP 05/12/17 1505 (!) 146/90     Pulse Rate 05/12/17 1505 (!) 106     Resp 05/12/17 1505 18     Temp 05/12/17 1505 97.7 F (36.5 C)     Temp Source 05/12/17 1505 Oral     SpO2 05/12/17 1454 96 %     Weight --      Height --      Head Circumference --      Peak Flow --      Pain Score 05/12/17 1455 7     Pain Loc --      Pain Edu? --      Excl. in [place]? --   TMAX(24)@       Latest  Blood pressure (!) 144/99, pulse 87, temperature 97.7 F (36.5 C), temperature source Oral, resp. rate 19, SpO2 97 %.      Hospitalist was called for admission for hyperosmolar nonketotic hyperglycemia   Review of Systems:    Pertinent positives include: fatigue polydipsia, polyuria  Constitutional:  No weight loss, night sweats, Fevers, , weight loss  HEENT:  No headaches, Difficulty swallowing,Tooth/dental problems,Sore throat,  No sneezing, itching, ear ache, nasal congestion, post nasal drip,  Cardio-vascular:  No chest pain, Orthopnea, PND, anasarca, dizziness, palpitations.no Bilateral lower extremity swelling  GI:  No heartburn, indigestion, abdominal pain, nausea, vomiting, diarrhea, change in bowel habits, loss of appetite, melena, blood in stool, hematemesis Resp:  no shortness of breath at rest. No dyspnea on exertion, No excess mucus, no productive cough, No non-productive cough, No coughing up of  blood.No change in color of mucus.No wheezing. Skin:  no rash or lesions. No jaundice GU:  no dysuria, change in color of urine, no urgency or frequency. No straining to urinate.  No flank pain.  Musculoskeletal:  No joint pain or no joint swelling. No decreased range of motion.  No back pain.  Psych:  No change in mood or affect. No depression or anxiety. No memory loss.  Neuro: no localizing neurological complaints, no tingling, no weakness, no double vision, no gait abnormality, no slurred speech, no confusion  As per HPI otherwise 10 point review of systems negative.   Past Medical History:   Past Medical History:  Diagnosis Date  . Atrial fibrillation ([place])   . Cancer [facility])    Prostate cancer-bx. 3 weeks ago  . Cataract   . Chronic combined systolic and diastolic CHF (congestive heart failure) (HCC)    a) EF 40-45% per 2D echo (02/2012) with grade 1 DD b)  NICM c) RHC (04/2012): RA: 4, RV 45/3/4, PA 42/9 (24), PCWP 14, Fick CO/CI: 5.2 /2.2, PVR 1.9 WU, PA 62% and 64% d) ECHO (10/2012) EF 40-45%, grade II DD, RV nl  . CKD (chronic kidney disease) stage 2, GFR 60-89 ml/min    BL SCr approximately 1-1.3  . Colitis 05/2009   History of colitis of ascending colon noted on CT abd/pelvis (05/2009), with interval resolution with subsequent CT  . Continuous chronic alcoholism ([place])   . Degenerative lumbar spinal stenosis    s/p L2-3, L3-4, L4-5 laminectomy partial facetectomy, and bilateral foraminotomy  . Diabetes mellitus without complication ([place])    Type II  . Dysrhythmia    A. Fib  . Family history of adverse reaction to anesthesia    "sister, can't go to sleep"  . GERD (gastroesophageal reflux disease)   . H/O cocaine abuse   . Hepatic steatosis    suspected 2/2 alcohol abuse  . Hepatitis C   . History of pancreatitis 01/2011   Admission for acute pancreatitis presumed 2/2 ongoing alcohol abuse- and hypertriglyceridemia  . History of pneumonia   . HIT (heparin-induced  thrombocytopenia) ([place])   . Hyperlipidemia   . Hypertension    uncontrolled with medication noncompliance  . Hypertriglyceridemia   . NICM (nonischemic cardiomyopathy) ([place])    a. LHC (04/2012): nl arteries  . PFO (patent foramen ovale) 01/2012   with right to left shunt, noted per TEE in evaluation for source of embolic stroke in 06/9627  . Rhabdomyolysis 02/22/2012   H/O rhabdomyolysis in 01/2012 that was idiopathic, cause never identified  . Schizophrenia, schizo-affective ([place])   . Shortness of breath dyspnea   . Splenic cyst   . Stroke ([place]) 01/2012   Small cerebellar infarcts right greater than left as well as questionable acute left external capsule and caudate nuclear punctate lacunar infarcts noted per MRI (01/2012) - presumed to be embolic likely source PFO with right to left shunt (noted per TEE 01/ 2014)      Past Surgical History:  Procedure Laterality Date  . ACHILLES TENDON REPAIR Right 2007   "it was torn"  . BACK SURGERY    . CARDIAC CATHETERIZATION N/A 09/02/2015   Procedure: Right Heart Cath;  Surgeon: Jolaine Artist, MD;  Location: [facility];  Service: Cardiovascular;  Laterality: N/A;  . CATARACT EXTRACTION W/ INTRAOCULAR LENS IMPLANT Right   . ESOPHAGOGASTRODUODENOSCOPY N/A 06/25/2012   Procedure: ESOPHAGOGASTRODUODENOSCOPY (EGD);  Surgeon: Milus Banister, MD;  Location: [place];  Service: Endoscopy;  Laterality: N/A;  . I&D EXTREMITY  03/20/2011   Procedure: IRRIGATION AND DEBRIDEMENT EXTREMITY;  Surgeon: Kerin Salen, MD;  Location: [facility];  Service: Orthopedics;  Laterality: Left;  I&D LEFT ACHILLIES TENDON  . KNEE ARTHROSCOPY Left   . LUMBAR LAMINECTOMY  L2-3, L3-4, L4-5 laminectomy, partial facetectomy  . RESECTION DISTAL CLAVICAL  09/17/2011   Procedure: RESECTION DISTAL CLAVICAL;  Surgeon: Nita Sells, MD;  Location: [place];  Service: Orthopedics;  Laterality: Right;  right shoulder arthroscopy with sad and  open distal clavicle excision   . ROBOT ASSISTED LAPAROSCOPIC RADICAL PROSTATECTOMY N/A 12/16/2012   Procedure: ROBOTIC ASSISTED LAPAROSCOPIC PROSTATECTOMY ;  Surgeon: Ardis Hughs, MD;  Location: [facility];  Service: Urology;  Laterality: N/A;  . TONSILLECTOMY      Social History:  Ambulatory independently occasional cain     reports that he quit smoking about 15 years ago. His smoking use included cigarettes. He has a 30.00 pack-year smoking history. He has never used smokeless tobacco. He reports that he drinks alcohol. He reports that he has current or past drug history. Drug: "Crack" cocaine.     Family History:   Family History  Problem Relation Age of Onset  . CAD Mother 18       deceased  . CAD Sister   . CAD Brother 71       died from MI at age 1yo  . Hypertension Unknown     Allergies: Allergies  Allergen Reactions  . Heparin Other (See Comments)    Documented HIT under problem list Pt states he's not allergic. " They told me not take it anymore"  . Thorazine [Chlorpromazine Hcl] Other (See Comments)    Body freezes up     Prior to Admission medications   Medication Sig Start Date End Date Taking? Authorizing Provider  Cariprazine HCl (VRAYLAR) 3 MG CAPS Take 3 mg by mouth at bedtime.   Yes [provider]  carvedilol (COREG) 6.25 MG tablet take 1 tablet by mouth twice a day with meals 04/30/16  Yes Bensimhon, Shaune Pascal, MD  Insulin Glargine (LANTUS SOLOSTAR) 100 UNIT/ML Solostar Pen Inject 50 Units into the skin daily at 10 pm. 01/21/17  Yes Oda Kilts, MD  insulin lispro (HUMALOG KWIKPEN) 100 UNIT/ML KiwkPen Inject 0.18 mLs (18 Units total) into the skin 3 (three) times daily. 01/21/17  Yes Oda Kilts, MD  metFORMIN (GLUCOPHAGE) 1000 MG tablet Take 1 tablet (1,000 mg total) by mouth 2 (two) times daily with a meal. 12/30/15 05/12/17 Yes Norman Herrlich, MD  QUEtiapine (SEROQUEL) 400 MG tablet Take 800 mg by mouth at bedtime.    Yes  [provider]  rivaroxaban (XARELTO) 20 MG TABS tablet Take 1 tablet (20 mg total) by mouth daily. 10/01/16  Yes Alphonzo Grieve, MD  rosuvastatin (CRESTOR) 20 MG tablet Take 1 tablet (20 mg total) by mouth daily. 10/20/16  Yes Arbutus Leas, NP  sacubitril-valsartan (ENTRESTO) 97-103 MG Take 1 tablet by mouth 2 (two) times daily. 10/20/16  Yes Arbutus Leas, NP  spironolactone (ALDACTONE) 25 MG tablet Take 0.5 tablets (12.5 mg total) by mouth daily. 10/20/16 05/12/17 Yes Arbutus Leas, NP  torsemide (DEMADEX) 20 MG tablet Take 40 mg (2 tabs) in am and 20 mg (1 tab) in pm. Patient taking differently: Take 20-40 mg by mouth 2 (two) times daily. Take 40 mg (2 tabs) in the morning and 20 mg (1 tab) in the evening 10/20/16  Yes Arbutus Leas, NP  ACCU-CHEK FASTCLIX LANCETS MISC Check blood sugar 4 times a day before meals and bedtime 02/16/17   [name], MD  B-D UF III MINI PEN NEEDLES 31G X 5 MM MISC Use four times daily as directed. Dx  code E11.00, Z79.4 12/23/16   Aldine Contes, MD  Blood Glucose Monitoring Suppl (ACCU-CHEK GUIDE) w/Device KIT 1 each by Does not apply route 4 (four) times daily. 02/16/17   [name], MD  glucose blood (ACCU-CHEK GUIDE) test strip Check blood sugar 4 times a day before meal and bedtime 02/16/17   [name], MD   Physical Exam: Blood pressure (!) 144/99, pulse 87, temperature 97.7 F (36.5 C), temperature source Oral, resp. rate 19, SpO2 97 %. 1. General:  in No Acute distress   Chronically ill  -appearing 2. Psychological: Alert and    Oriented 3. Head/ENT:    Dry Mucous Membranes                          Head Non traumatic, neck supple                           Poor Dentition 4. SKIN:  decreased Skin turgor,  Skin clean Dry and intact no rash 5. Heart: Regular rate and rhythm no  Murmur, no Rub or gallop 6. Lungs:   no wheezes or crackles   7. Abdomen: Soft,  non-tender, Non distended   obese  bowel sounds present 8. Lower extremities:  no clubbing, cyanosis, or edema 9. Neurologically strength 5 out of 5 in all 4 extremities cranial nerves II through XII intact 10. MSK: Normal range of motion   LABS:     Recent Labs  Lab 05/12/17 1547  WBC 4.7  HGB 18.0*  HCT 50.8  MCV 82.9  PLT 878   Basic Metabolic Panel: Recent Labs  Lab 05/12/17 1547  NA 120*  K 5.3*  CL 86*  CO2 18*  GLUCOSE 1,021*  BUN 25*  CREATININE 1.41*  CALCIUM 10.7*      No results for input(s): AST, ALT, ALKPHOS, BILITOT, PROT, ALBUMIN in the last 168 hours. No results for input(s): LIPASE, AMYLASE in the last 168 hours. No results for input(s): AMMONIA in the last 168 hours.    HbA1C: No results for input(s): HGBA1C in the last 72 hours. CBG: Recent Labs  Lab 05/12/17 1458 05/12/17 1740 05/12/17 1900  GLUCAP >600* >600* >600*      Urine analysis:    Component Value Date/Time   COLORURINE STRAW (A) 05/12/2017 1658   APPEARANCEUR CLEAR 05/12/2017 1658   LABSPEC 1.015 05/12/2017 1658   PHURINE 5.0 05/12/2017 1658   GLUCOSEU >=500 (A) 05/12/2017 1658   HGBUR SMALL (A) 05/12/2017 1658   BILIRUBINUR NEGATIVE 05/12/2017 1658   KETONESUR NEGATIVE 05/12/2017 1658   PROTEINUR NEGATIVE 05/12/2017 1658   UROBILINOGEN 1.0 05/17/2014 1925   NITRITE NEGATIVE 05/12/2017 1658   LEUKOCYTESUR NEGATIVE 05/12/2017 1658       Cultures:    Component Value Date/Time   SDES URINE, RANDOM 12/13/2012 0825   SPECREQUEST NONE 12/13/2012 0825   CULT NO GROWTH Performed at [facility] 12/13/2012 0825   REPTSTATUS 12/15/2012 FINAL 12/13/2012 0825     Radiological Exams on Admission: Dg Chest 2 View  Result Date: 05/12/2017 CLINICAL DATA:  Weakness EXAM: CHEST - 2 VIEW COMPARISON:  11/26/2016 FINDINGS: Cardiac enlargement without heart failure. Lungs are clear without infiltrate or effusion. Chronic right AC separation unchanged. Degenerative change in the left AC joint. IMPRESSION: No active cardiopulmonary disease.  Electronically Signed   By: Franchot Gallo M.D.   On: 05/12/2017 18:35    Chart has been reviewed  Assessment/Plan   62 y.o. male with medical history significant of DM2, CHF 20-25%. A.fib, schizophrenia, CKD stage III, HTN, HLD, COPD presumed, hx of EtOH and cocaine abuse     Admitted for  hyperosmolar nonketotic hyperglycemia   Present on Admission: . Nonketotic hyperglycinemia, type II ([place]) initiate glucose stabilizer and transition to home dose of insulin will need to have close follow-up diabetes coordinator consult . CKD (chronic kidney disease) stage 2, GFR 60-89 ml/min -avoid nephrotoxic medications continue to follow . Chronic combined systolic (congestive) and diastolic (congestive) heart failure (HCC) Heart failure with reduced ejection fraction, NYHA class III (HCC) currently appears to be slightly on the dry side will hold diuretics for tonight restart when able . HTN (hypertension) stable continue home medications  . Paroxysmal atrial fibrillation (HCC) -           - CHA2DS2 vas score 8 : continue current anticoagulation with   Xarelto            -  Rate control:  Currently controlled with  Coreg will continue    . Schizophrenia ([place]) we will restart home medications  . Elevated troponin cycle cardiac enzymes monitor on telemetry obtain echogram to evaluate for any wall motion abnormality   . Dehydration dehydrated  Left leg weakness -in the setting of severely elevated blood sugar ,CT of the head unremarkable for for any new ischemic events.  Attempted to obtain MRI patient did not tolerate states my arm hurts I cannot fit in MRI was aborted.  Patient will be admitted to [facility] discussed briefly with neurology.  Would reconsult if there is any evidence of significantly worsening neuro status.  Continue Xarelto make sure patient is on aspirin get PT OT evaluation  Other plan as per orders.  DVT prophylaxis:  SCD    Code Status:  FULL CODE  as per patient     I had personally discussed CODE STATUS with patient    Family Communication:   Family not  at  Bedside   Disposition Plan:    To home once workup is complete and patient is stable   Would benefit from PT/OT eval prior to DC   ordered                    Care coordinator   Diabetes coordinator    consulted                          Consults called: none  Admission status:   Inpatient    Level of care        SDU       Toy Baker 05/12/2017, 8:22 PM    [facility]  Pager [phone]   after 2 AM please page floor coverage PA If 7AM-7PM, please contact the day team taking care of the patient  [email or web address]  Password [email or web address]

## 2017-05-12 NOTE — ED Triage Notes (Addendum)
Per EMS pt hyperglycemia for 2 weeks; out of short acting insulin; left knee/weakness since Monday. Pt given 500 ml bolus of normal saline with EMS.

## 2017-05-12 NOTE — ED Provider Notes (Signed)
[facility] DEPT Provider Note   CSN: [id number] Arrival date & time: 05/12/17  1432     History   Chief Complaint Chief Complaint  Patient presents with  . Hyperglycemia    HPI [name] [name] is a 62 y.o. male.  HPI  Patient is a 62 year old male with a history of type 2 diabetes mellitus but this is insulin-dependent), prostate cancer, atrial fibrillation, heart failure with reduced ejection fraction (last EF 20-25%), hypertension, CKD, and schizophrenia presenting for hyperglycemia, and weakness.  Patient reports that he has been out of his insulin for approximately 2 weeks, as he was discharged from his previous provider and does not have another provider.  Patient is cared for at home by an aide, and has a legal guardian and his sister.  Patient reports that today, he felt that his left leg was weak, but denies left-sided arm weakness, or facial weakness, acute visual disturbance, or changes in speech.  Patient denies syncope, dizziness, lightheadedness.  Patient does report a history of low back pain.  Patient denies fever, but does report he has been chilled.  Patient denies chest pain or shortness of breath.  Patient reports polyuria and polydipsia.  Patient reports he has been taking all of his other medications including antihypertensives, torsemide, and most doses of his Seroquel, but does report that he misses a dose every 3-4 nights.  Past Medical History:  Diagnosis Date  . Atrial fibrillation ([place])   . Cancer [facility])    Prostate cancer-bx. 3 weeks ago  . Cataract   . Chronic combined systolic and diastolic CHF (congestive heart failure) (HCC)    a) EF 40-45% per 2D echo (02/2012) with grade 1 DD b)  NICM c) RHC (04/2012): RA: 4, RV 45/3/4, PA 42/9 (24), PCWP 14, Fick CO/CI: 5.2 /2.2, PVR 1.9 WU, PA 62% and 64% d) ECHO (10/2012) EF 40-45%, grade II DD, RV nl  . CKD (chronic kidney disease) stage 2, GFR 60-89 ml/min    BL SCr approximately  1-1.3  . Colitis 05/2009   History of colitis of ascending colon noted on CT abd/pelvis (05/2009), with interval resolution with subsequent CT  . Continuous chronic alcoholism ([place])   . Degenerative lumbar spinal stenosis    s/p L2-3, L3-4, L4-5 laminectomy partial facetectomy, and bilateral foraminotomy  . Diabetes mellitus without complication ([place])    Type II  . Dysrhythmia    A. Fib  . Family history of adverse reaction to anesthesia    "sister, can't go to sleep"  . GERD (gastroesophageal reflux disease)   . H/O cocaine abuse   . Hepatic steatosis    suspected 2/2 alcohol abuse  . Hepatitis C   . History of pancreatitis 01/2011   Admission for acute pancreatitis presumed 2/2 ongoing alcohol abuse- and hypertriglyceridemia  . History of pneumonia   . HIT (heparin-induced thrombocytopenia) ([place])   . Hyperlipidemia   . Hypertension    uncontrolled with medication noncompliance  . Hypertriglyceridemia   . NICM (nonischemic cardiomyopathy) ([place])    a. LHC (04/2012): nl arteries  . PFO (patent foramen ovale) 01/2012   with right to left shunt, noted per TEE in evaluation for source of embolic stroke in 01/3084  . Rhabdomyolysis 02/22/2012   H/O rhabdomyolysis in 01/2012 that was idiopathic, cause never identified  . Schizophrenia, schizo-affective ([place])   . Shortness of breath dyspnea   . Splenic cyst   . Stroke ([place]) 01/2012   Small cerebellar infarcts right greater  than left as well as questionable acute left external capsule and caudate nuclear punctate lacunar infarcts noted per MRI (01/2012) - presumed to be embolic likely source PFO with right to left shunt (noted per TEE 01/ 2014)    Patient Active Problem List   Diagnosis Date Noted  . AKI (acute kidney injury) ([place])   . Acute on chronic respiratory failure with hypoxia ([place])   . Acute on chronic diastolic (congestive) heart failure ([place])   . Chest pain 11/27/2016  . SOB (shortness of breath) 11/26/2016  . Acute  exacerbation of CHF (congestive heart failure) ([place]) 10/01/2016  . Acute on chronic systolic CHF (congestive heart failure) ([place]) 10/01/2016  . Diabetes ([place])   . Overgrown toenails 09/10/2015  . Heart failure with reduced ejection fraction, NYHA class III ([place]) 06/07/2015  . Hepatitis C antibody test positive 06/07/2015  . Paroxysmal atrial fibrillation ([place]) 06/07/2015  . Schizophrenia ([place]) 03/06/2014  . Carpal tunnel syndrome 04/11/2013  . Chronic back pain greater than 3 months duration 07/19/2012  . Gastric ulcer with hemorrhage 06/25/2012  . CKD (chronic kidney disease) stage 2, GFR 60-89 ml/min   . CVA (cerebral infarction) 02/29/2012  . HTN (hypertension) 02/20/2012    Past Surgical History:  Procedure Laterality Date  . ACHILLES TENDON REPAIR Right 2007   "it was torn"  . BACK SURGERY    . CARDIAC CATHETERIZATION N/A 09/02/2015   Procedure: Right Heart Cath;  Surgeon: Jolaine Artist, MD;  Location: [facility];  Service: Cardiovascular;  Laterality: N/A;  . CATARACT EXTRACTION W/ INTRAOCULAR LENS IMPLANT Right   . ESOPHAGOGASTRODUODENOSCOPY N/A 06/25/2012   Procedure: ESOPHAGOGASTRODUODENOSCOPY (EGD);  Surgeon: Milus Banister, MD;  Location: [facility];  Service: Endoscopy;  Laterality: N/A;  . I&D EXTREMITY  03/20/2011   Procedure: IRRIGATION AND DEBRIDEMENT EXTREMITY;  Surgeon: Kerin Salen, MD;  Location: [facility];  Service: Orthopedics;  Laterality: Left;  I&D LEFT ACHILLIES TENDON  . KNEE ARTHROSCOPY Left   . LUMBAR LAMINECTOMY     L2-3, L3-4, L4-5 laminectomy, partial facetectomy  . RESECTION DISTAL CLAVICAL  09/17/2011   Procedure: RESECTION DISTAL CLAVICAL;  Surgeon: Nita Sells, MD;  Location: [facility];  Service: Orthopedics;  Laterality: Right;  right shoulder arthroscopy with sad and open distal clavicle excision   . ROBOT ASSISTED LAPAROSCOPIC RADICAL PROSTATECTOMY N/A 12/16/2012   Procedure: ROBOTIC ASSISTED LAPAROSCOPIC  PROSTATECTOMY ;  Surgeon: Ardis Hughs, MD;  Location: [facility];  Service: Urology;  Laterality: N/A;  . TONSILLECTOMY          Home Medications    Prior to Admission medications   Medication Sig Start Date End Date Taking? Authorizing Provider  Cariprazine HCl (VRAYLAR) 3 MG CAPS Take 3 mg by mouth at bedtime.   Yes [provider]  carvedilol (COREG) 6.25 MG tablet take 1 tablet by mouth twice a day with meals 04/30/16  Yes Bensimhon, Shaune Pascal, MD  Insulin Glargine (LANTUS SOLOSTAR) 100 UNIT/ML Solostar Pen Inject 50 Units into the skin daily at 10 pm. 01/21/17  Yes Oda Kilts, MD  insulin lispro (HUMALOG KWIKPEN) 100 UNIT/ML KiwkPen Inject 0.18 mLs (18 Units total) into the skin 3 (three) times daily. 01/21/17  Yes Oda Kilts, MD  metFORMIN (GLUCOPHAGE) 1000 MG tablet Take 1 tablet (1,000 mg total) by mouth 2 (two) times daily with a meal. 12/30/15 05/12/17 Yes Norman Herrlich, MD  QUEtiapine (SEROQUEL) 400 MG tablet Take 800 mg by mouth at  bedtime.    Yes [provider]  rivaroxaban (XARELTO) 20 MG TABS tablet Take 1 tablet (20 mg total) by mouth daily. 10/01/16  Yes Alphonzo Grieve, MD  rosuvastatin (CRESTOR) 20 MG tablet Take 1 tablet (20 mg total) by mouth daily. 10/20/16  Yes Arbutus Leas, NP  sacubitril-valsartan (ENTRESTO) 97-103 MG Take 1 tablet by mouth 2 (two) times daily. 10/20/16  Yes Arbutus Leas, NP  spironolactone (ALDACTONE) 25 MG tablet Take 0.5 tablets (12.5 mg total) by mouth daily. 10/20/16 05/12/17 Yes Arbutus Leas, NP  torsemide (DEMADEX) 20 MG tablet Take 40 mg (2 tabs) in am and 20 mg (1 tab) in pm. Patient taking differently: Take 20-40 mg by mouth 2 (two) times daily. Take 40 mg (2 tabs) in the morning and 20 mg (1 tab) in the evening 10/20/16  Yes Arbutus Leas, NP  ACCU-CHEK FASTCLIX LANCETS MISC Check blood sugar 4 times a day before meals and bedtime 02/16/17   [name], MD  B-D UF III MINI PEN NEEDLES 31G X 5 MM MISC Use  four times daily as directed. Dx code E11.00, Z79.4 12/23/16   Aldine Contes, MD  Blood Glucose Monitoring Suppl (ACCU-CHEK GUIDE) w/Device KIT 1 each by Does not apply route 4 (four) times daily. 02/16/17   [name], MD  glucose blood (ACCU-CHEK GUIDE) test strip Check blood sugar 4 times a day before meal and bedtime 02/16/17   [name], MD    Family History Family History  Problem Relation Age of Onset  . CAD Mother 1       deceased  . CAD Sister   . CAD Brother 63       died from MI at age 8yo  . Hypertension Unknown     Social History Social History   Tobacco Use  . Smoking status: Former Smoker    Packs/day: 1.00    Years: 30.00    Pack years: 30.00    Types: Cigarettes    Last attempt to quit: 06/24/2001    Years since quitting: 15.8  . Smokeless tobacco: Never Used  Substance Use Topics  . Alcohol use: Yes    Alcohol/week: 0.0 oz  . Drug use: Yes    Types: "Crack" cocaine    Comment: h/o cocaine abuse; last use about 2 months ago (10/01/16)     Allergies   Heparin and Thorazine [chlorpromazine hcl]   Review of Systems Review of Systems  Constitutional: Negative for chills and fever.  HENT: Negative for congestion and rhinorrhea.   Eyes: Negative for visual disturbance.  Respiratory: Negative for cough, chest tightness and shortness of breath.   Cardiovascular: Negative for chest pain, palpitations and leg swelling.  Gastrointestinal: Negative for abdominal pain, constipation, diarrhea, nausea and vomiting.  Endocrine: Positive for polydipsia and polyuria.  Genitourinary: Negative for dysuria.  Musculoskeletal: Positive for back pain.  Skin: Negative for rash.  Neurological: Positive for weakness. Negative for dizziness, syncope, light-headedness and numbness.     Physical Exam Updated Vital Signs BP (!) 149/119 (BP Location: Left Arm)   Pulse 84   Temp 97.7 F (36.5 C) (Oral)   Resp 16   SpO2 92%   Physical Exam    Constitutional: He appears well-developed and well-nourished. No distress.  Disheveled and unkempt.  HENT:  Head: Normocephalic and atraumatic.  Dry oropharynx.  Eyes: Pupils are equal, round, and reactive to light. Conjunctivae and EOM are normal.  Neck: Normal range of motion. Neck supple.  Cardiovascular: Normal rate, regular rhythm, S1 normal, S2 normal and intact distal pulses.  No murmur heard. Pulmonary/Chest: Effort normal. He has no wheezes. He has rales.  Soft bibasilar rales.  Abdominal: Soft. He exhibits no distension. There is no tenderness. There is no guarding.  Musculoskeletal: Normal range of motion. He exhibits no edema or deformity.  Lymphadenopathy:    He has no cervical adenopathy.  Neurological: He is alert.  Mental Status:  Alert, oriented, thought content appropriate, able to give a coherent history. Speech fluent without evidence of aphasia. Able to follow 2 step commands without difficulty.  Cranial Nerves:  II:  Peripheral visual fields grossly normal, pupils equal, round, reactive to light III,IV, VI: ptosis not present, extra-ocular motions intact bilaterally  V,VII: smile symmetric, facial light touch sensation equal VIII: hearing grossly normal to voice  X: uvula elevates symmetrically  XI: bilateral shoulder shrug symmetric and strong XII: midline tongue extension without fassiculations Motor:  Normal tone. 5/5 in upper and lower extremities bilaterally including strong and equal grip strength and dorsiflexion/plantar flexion Sensory: Pinprick and light touch normal in all extremities.  Deep Tendon Reflexes: Difficult to elicit due to body habitus. Cerebellar: normal finger-to-nose with bilateral upper extremities Gait: normal gait and balance Stance: No pronator drift and good coordination, strength, and position sense with tapping of bilateral arms (performed in sitting position). CV: distal pulses palpable throughout    Skin: Skin is warm and  dry. Capillary refill takes less than 2 seconds. Rash noted. No erythema.  Diffuse xerosis of abdomen and extremities.  Psychiatric: He has a normal mood and affect. His behavior is normal. Judgment and thought content normal.  Nursing note and vitals reviewed.    ED Treatments / Results  Labs (all labs ordered are listed, but only abnormal results are displayed) Labs Reviewed  BASIC METABOLIC PANEL - Abnormal; Notable for the following components:      Result Value   Sodium 120 (*)    Potassium 5.3 (*)    Chloride 86 (*)    CO2 18 (*)    Glucose, Bld 1,021 (*)    BUN 25 (*)    Creatinine, Ser 1.41 (*)    Calcium 10.7 (*)    GFR calc non Af Amer 52 (*)    GFR calc Af Amer 60 (*)    Anion gap 16 (*)    All other components within normal limits  CBC - Abnormal; Notable for the following components:   RBC 6.13 (*)    Hemoglobin 18.0 (*)    All other components within normal limits  URINALYSIS, ROUTINE W REFLEX MICROSCOPIC - Abnormal; Notable for the following components:   Color, Urine STRAW (*)    Glucose, UA >=500 (*)    Hgb urine dipstick SMALL (*)    All other components within normal limits  CBG MONITORING, ED - Abnormal; Notable for the following components:   Glucose-Capillary >600 (*)    All other components within normal limits  BLOOD GAS, VENOUS  TROPONIN I  BASIC METABOLIC PANEL  BASIC METABOLIC PANEL  BASIC METABOLIC PANEL  HEPATIC FUNCTION PANEL    EKG EKG Interpretation  Date/Time:  Wednesday May 12 2017 17:12:20 EDT Ventricular Rate:  90 PR Interval:    QRS Duration: 122 QT Interval:  385 QTC Calculation: 472 R Axis:   -102 Text Interpretation:  Sinus rhythm Probable left atrial enlargement Nonspecific IVCD with LAD Consider anterolateral infarct Confirmed by Virgel Manifold [phone]) on 05/12/2017 5:24:23 PM  Radiology No results found.  Procedures Procedures (including critical care time)  Medications Ordered in ED Medications  dextrose 5  %-0.45 % sodium chloride infusion (has no administration in time range)  insulin regular (NOVOLIN R,HUMULIN R) 100 Units in sodium chloride 0.9 % 100 mL (1 Units/mL) infusion (has no administration in time range)  sodium chloride 0.9 % bolus 1,000 mL (1,000 mLs Intravenous New Bag/Given 05/12/17 1741)    And  sodium chloride 0.9 % bolus 1,000 mL (1,000 mLs Intravenous New Bag/Given 05/12/17 1741)    And  0.9 %  sodium chloride infusion (has no administration in time range)     Initial Impression / Assessment and Plan / ED Course  I have reviewed the triage vital signs and the nursing notes.  Pertinent labs & imaging results that were available during my care of the patient were reviewed by me and considered in my medical decision making (see chart for details).  Clinical Course as of May 12 1745  Wed May 12, 2017  1747 Elevated troponin is noted.  Patient has a history of elevated troponin.  Suspect demand ischemia.   [AM]    Clinical Course User Index [AM] Albesa Seen, PA-C    Patient is nontoxic-appearing but appears generally weak.  Suspect significant volume depletion.  Patient with a glucose of 1021 on presentation.  Only neurologic symptom reported by patient to suggest HHS is left leg weakness.  Doubt CVA as cause of patient's left leg weakness, as he has no associated abnormal sensation, or left arm or facial weakness. Suspect cause of patient's hyperglycemia is lack of insulin.  Do not suspect infectious cause, as patient has no infectious symptoms at this time.    Workup significant for hyperglycemia 1021.  Glucose stabilizer initiated.  2 L of normal saline initiated as well.  Potassium is 5.3, so no potassium repletion initiated.  Likely factitious hyponatremia of 120 in the setting of hyperglycemia.  Hemoglobin of 18 is also suggestive of significant hemoconcentration.  BUN and creatinine are improved from baseline.  VBG without abnormality.  Initial troponin ordered a  screening due to generalized weakness, and is 0.08.  Patient has a history of troponin elevation.  Will order delta troponin, but suspect this is demand ischemia, or chronic evated troponin in the setting of CKD.  EKG without significant acute evidence of ischemia, infarction, or arrhythmia.  Due to significant hyperglycemia, and generalized weakness, will admit patient for insulin therapy and rehydration.  Spoke with Dr. Roel Cluck of [facility], who will admit the patient.   This is a shared visit with Dr. Daleen Bo. Patient was independently evaluated by this attending physician. Attending physician consulted in evaluation and admsision management.   Final Clinical Impressions(s) / ED Diagnoses   Final diagnoses:  Type 2 diabetes mellitus with hyperosmolarity without coma, with long-term current use of insulin [facility])  Generalized weakness    ED Discharge Orders    None       Tamala Julian 05/13/17 0017    Daleen Bo, MD 05/13/17 [phone]

## 2017-05-12 NOTE — ED Provider Notes (Signed)
  Face-to-face evaluation   History: Patient presents for evaluation of not feeling well, and soreness in his left knee.  He is out of his medications.  Physical exam: Obese, alert, cooperative.  Heart regular rate and rhythm.  Lungs clear anteriorly.  Abdomen soft nontender.  He is lucid.  There is no dysarthria or aphasia.  Medical screening examination/treatment/procedure(s) were conducted as a shared visit with non-physician practitioner(s) and myself.  I personally evaluated the patient during the encounter   Daleen Bo, MD 05/13/17 2307

## 2017-05-12 NOTE — ED Notes (Signed)
Date and time results received: 05/12/17 1650 (use smartphrase ".now" to insert current time)  Test: Blood glucose Critical Value: 1021  Name of Provider Notified: Antionette Poles.  Orders Received? Or Actions Taken?:

## 2017-05-12 NOTE — ED Notes (Signed)
RT has been called about VBG and is in route to [employer].

## 2017-05-12 NOTE — ED Notes (Signed)
Patient transported to X-ray 

## 2017-05-12 NOTE — ED Notes (Signed)
Patient was unable to complete MRI.

## 2017-05-13 ENCOUNTER — Inpatient Hospital Stay (HOSPITAL_COMMUNITY): Payer: Medicare Other

## 2017-05-13 ENCOUNTER — Other Ambulatory Visit: Payer: Self-pay

## 2017-05-13 ENCOUNTER — Ambulatory Visit (HOSPITAL_COMMUNITY)
Admit: 2017-05-13 | Discharge: 2017-05-13 | Disposition: A | Discharge status: Home / Self Care | Payer: Medicare Other | Attending: Internal Medicine | Admitting: Internal Medicine

## 2017-05-13 DIAGNOSIS — I639 Cerebral infarction, unspecified: Secondary | ICD-10-CM

## 2017-05-13 DIAGNOSIS — I5042 Chronic combined systolic (congestive) and diastolic (congestive) heart failure: Secondary | ICD-10-CM

## 2017-05-13 DIAGNOSIS — Z794 Long term (current) use of insulin: Secondary | ICD-10-CM

## 2017-05-13 LAB — GLUCOSE, CAPILLARY
GLUCOSE-CAPILLARY: 133 mg/dL — AB (ref 65–99)
GLUCOSE-CAPILLARY: 145 mg/dL — AB (ref 65–99)
GLUCOSE-CAPILLARY: 181 mg/dL — AB (ref 65–99)
GLUCOSE-CAPILLARY: 170 mg/dL — AB (ref 65–99)
GLUCOSE-CAPILLARY: 335 mg/dL — AB (ref 65–99)
GLUCOSE-CAPILLARY: 231 mg/dL — AB (ref 65–99)
GLUCOSE-CAPILLARY: 270 mg/dL — AB (ref 65–99)
Glucose-Capillary: 136 mg/dL — ABNORMAL HIGH (ref 65–99)
Glucose-Capillary: 139 mg/dL — ABNORMAL HIGH (ref 65–99)
Glucose-Capillary: 143 mg/dL — ABNORMAL HIGH (ref 65–99)
Glucose-Capillary: 148 mg/dL — ABNORMAL HIGH (ref 65–99)
Glucose-Capillary: 157 mg/dL — ABNORMAL HIGH (ref 65–99)
Glucose-Capillary: 165 mg/dL — ABNORMAL HIGH (ref 65–99)
Glucose-Capillary: 166 mg/dL — ABNORMAL HIGH (ref 65–99)
Glucose-Capillary: 184 mg/dL — ABNORMAL HIGH (ref 65–99)
Glucose-Capillary: 200 mg/dL — ABNORMAL HIGH (ref 65–99)
Glucose-Capillary: 204 mg/dL — ABNORMAL HIGH (ref 65–99)
Glucose-Capillary: 221 mg/dL — ABNORMAL HIGH (ref 65–99)

## 2017-05-13 LAB — BLOOD GAS, ARTERIAL
ACID-BASE DEFICIT: 2.3 mmol/L — AB (ref 0.0–2.0)
Bicarbonate: 22 mmol/L (ref 20.0–28.0)
Drawn by: 441261
O2 Content: 2 L/min
O2 Saturation: 96 %
PCO2 ART: 38.5 mmHg (ref 32.0–48.0)
PH ART: 7.376 (ref 7.350–7.450)
PO2 ART: 88.3 mmHg (ref 83.0–108.0)
Patient temperature: 98.6

## 2017-05-13 LAB — COMPREHENSIVE METABOLIC PANEL
ALK PHOS: 98 U/L (ref 38–126)
ALT: 20 U/L (ref 17–63)
ANION GAP: 12 (ref 5–15)
AST: 17 U/L (ref 15–41)
Albumin: 3.7 g/dL (ref 3.5–5.0)
BILIRUBIN TOTAL: 1 mg/dL (ref 0.3–1.2)
BUN: 22 mg/dL — ABNORMAL HIGH (ref 6–20)
CO2: 22 mmol/L (ref 22–32)
CREATININE: 0.9 mg/dL (ref 0.61–1.24)
Calcium: 10.1 mg/dL (ref 8.9–10.3)
Chloride: 102 mmol/L (ref 101–111)
GFR calc non Af Amer: >60 mL/min (ref >60)
GLUCOSE: 133 mg/dL — AB (ref 65–99)
Potassium: 3.3 mmol/L — ABNORMAL LOW (ref 3.5–5.1)
Sodium: 136 mmol/L (ref 135–145)
TOTAL PROTEIN: 7.4 g/dL (ref 6.5–8.1)

## 2017-05-13 LAB — CBC
HCT: 45.2 % (ref 39.0–52.0)
HEMOGLOBIN: 15.8 g/dL (ref 13.0–17.0)
MCH: 29.8 pg (ref 26.0–34.0)
MCHC: 35 g/dL (ref 30.0–36.0)
MCV: 85.1 fL (ref 78.0–100.0)
PLATELETS: 182 10*3/uL (ref 150–400)
RBC: 5.31 MIL/uL (ref 4.22–5.81)
RDW: 13.3 % (ref 11.5–15.5)
WBC: 6.1 10*3/uL (ref 4.0–10.5)

## 2017-05-13 LAB — BASIC METABOLIC PANEL
Anion gap: 11 (ref 5–15)
BUN: 22 mg/dL — AB (ref 6–20)
CALCIUM: 10.4 mg/dL — AB (ref 8.9–10.3)
CO2: 23 mmol/L (ref 22–32)
CREATININE: 1.11 mg/dL (ref 0.61–1.24)
Chloride: 103 mmol/L (ref 101–111)
Glucose, Bld: 191 mg/dL — ABNORMAL HIGH (ref 65–99)
Potassium: 3.8 mmol/L (ref 3.5–5.1)
Sodium: 137 mmol/L (ref 135–145)

## 2017-05-13 LAB — PHOSPHORUS: Phosphorus: 3.2 mg/dL (ref 2.5–4.6)

## 2017-05-13 LAB — MRSA PCR SCREENING: MRSA BY PCR: NEGATIVE

## 2017-05-13 LAB — ECHOCARDIOGRAM COMPLETE
HEIGHTINCHES: 71 in
WEIGHTICAEL: 4368 [oz_av]

## 2017-05-13 LAB — TSH: TSH: 2.962 u[IU]/mL (ref 0.350–4.500)

## 2017-05-13 LAB — LACTIC ACID, PLASMA: LACTIC ACID, VENOUS: 1.2 mmol/L (ref 0.5–1.9)

## 2017-05-13 LAB — TROPONIN I
TROPONIN I: 0.1 ng/mL — AB (ref <0.03)
TROPONIN I: 0.06 ng/mL — AB (ref <0.03)
TROPONIN I: 0.07 ng/mL — AB (ref <0.03)
Troponin I: 0.09 ng/mL (ref <0.03)

## 2017-05-13 LAB — MAGNESIUM: MAGNESIUM: 2 mg/dL (ref 1.7–2.4)

## 2017-05-13 LAB — BRAIN NATRIURETIC PEPTIDE: B Natriuretic Peptide: 38 pg/mL (ref 0.0–100.0)

## 2017-05-13 MED ORDER — ONDANSETRON HCL 4 MG PO TABS: 4.0000 mg | ORAL_TABLET | Freq: Four times a day (QID) | ORAL | Status: DC | PRN

## 2017-05-13 MED ORDER — INSULIN ASPART 100 UNIT/ML ~~LOC~~ SOLN
0.0000 [IU] | Freq: Three times a day (TID) | SUBCUTANEOUS | Status: DC
  Administered 2017-05-13: 2 [IU] via SUBCUTANEOUS
  Administered 2017-05-14 (×2): 11 [IU] via SUBCUTANEOUS

## 2017-05-13 MED ORDER — ACETAMINOPHEN 650 MG RE SUPP: 650.0000 mg | Freq: Four times a day (QID) | RECTAL | Status: DC | PRN

## 2017-05-13 MED ORDER — ALBUTEROL SULFATE (2.5 MG/3ML) 0.083% IN NEBU: 2.5000 mg | INHALATION_SOLUTION | RESPIRATORY_TRACT | Status: DC | PRN

## 2017-05-13 MED ORDER — ASPIRIN EC 81 MG PO TBEC
81.0000 mg | DELAYED_RELEASE_TABLET | Freq: Every day | ORAL | Status: DC
  Administered 2017-05-13: 81 mg via ORAL
  Filled 2017-05-13: qty 1

## 2017-05-13 MED ORDER — SPIRONOLACTONE 12.5 MG HALF TABLET
12.5000 mg | ORAL_TABLET | Freq: Every day | ORAL | Status: DC
  Filled 2017-05-13: qty 1

## 2017-05-13 MED ORDER — NOREPINEPHRINE BITARTRATE 1 MG/ML IV SOLN
0.0000 ug/min | INTRAVENOUS | Status: DC
  Filled 2017-05-13: qty 4

## 2017-05-13 MED ORDER — SACUBITRIL-VALSARTAN 97-103 MG PO TABS
1.0000 | ORAL_TABLET | Freq: Two times a day (BID) | ORAL | Status: DC
  Administered 2017-05-13: 1 via ORAL
  Filled 2017-05-13 (×2): qty 1

## 2017-05-13 MED ORDER — DEXTROSE 50 % IV SOLN: 25.0000 mL | INTRAVENOUS | Status: DC | PRN

## 2017-05-13 MED ORDER — SODIUM CHLORIDE 0.9 % IV BOLUS
1000.0000 mL | Freq: Once | INTRAVENOUS | Status: AC
  Administered 2017-05-13: 1000 mL via INTRAVENOUS

## 2017-05-13 MED ORDER — STROKE: EARLY STAGES OF RECOVERY BOOK
Freq: Once | Status: AC
Start: 2017-05-13 — End: 2017-05-14
  Administered 2017-05-14
  Filled 2017-05-13: qty 1

## 2017-05-13 MED ORDER — INSULIN ASPART 100 UNIT/ML ~~LOC~~ SOLN
9.0000 [IU] | Freq: Three times a day (TID) | SUBCUTANEOUS | Status: DC
  Administered 2017-05-13 – 2017-05-14 (×3): 9 [IU] via SUBCUTANEOUS

## 2017-05-13 MED ORDER — ACETAMINOPHEN 325 MG PO TABS: 650.0000 mg | ORAL_TABLET | Freq: Four times a day (QID) | ORAL | Status: DC | PRN

## 2017-05-13 MED ORDER — PERFLUTREN LIPID MICROSPHERE
INTRAVENOUS | Status: AC
  Filled 2017-05-13: qty 10

## 2017-05-13 MED ORDER — QUETIAPINE FUMARATE 200 MG PO TABS
800.0000 mg | ORAL_TABLET | Freq: Every day | ORAL | Status: DC
  Administered 2017-05-13: 800 mg via ORAL
  Filled 2017-05-13: qty 8
  Filled 2017-05-13 (×3): qty 4

## 2017-05-13 MED ORDER — ROSUVASTATIN CALCIUM 20 MG PO TABS
20.0000 mg | ORAL_TABLET | Freq: Every day | ORAL | Status: DC
  Administered 2017-05-13: 20 mg via ORAL
  Filled 2017-05-13: qty 1

## 2017-05-13 MED ORDER — INSULIN REGULAR BOLUS VIA INFUSION
0.0000 [IU] | Freq: Three times a day (TID) | INTRAVENOUS | Status: DC
  Filled 2017-05-13: qty 10

## 2017-05-13 MED ORDER — INSULIN ASPART 100 UNIT/ML ~~LOC~~ SOLN
0.0000 [IU] | Freq: Every day | SUBCUTANEOUS | Status: DC
  Administered 2017-05-13: 3 [IU] via SUBCUTANEOUS

## 2017-05-13 MED ORDER — RIVAROXABAN 20 MG PO TABS
20.0000 mg | ORAL_TABLET | Freq: Every day | ORAL | Status: DC
  Administered 2017-05-13 – 2017-05-14 (×2): 20 mg via ORAL
  Filled 2017-05-13 (×2): qty 1

## 2017-05-13 MED ORDER — DEXTROSE-NACL 5-0.45 % IV SOLN
INTRAVENOUS | Status: DC
  Administered 2017-05-13: 125 mL/h via INTRAVENOUS

## 2017-05-13 MED ORDER — SODIUM CHLORIDE 0.9 % IV BOLUS
500.0000 mL | Freq: Once | INTRAVENOUS | Status: AC
  Administered 2017-05-13: 500 mL via INTRAVENOUS

## 2017-05-13 MED ORDER — INSULIN GLARGINE 100 UNIT/ML ~~LOC~~ SOLN
40.0000 [IU] | SUBCUTANEOUS | Status: DC
  Administered 2017-05-13 – 2017-05-14 (×2): 40 [IU] via SUBCUTANEOUS
  Filled 2017-05-13 (×3): qty 0.4

## 2017-05-13 MED ORDER — POTASSIUM CHLORIDE CRYS ER 20 MEQ PO TBCR
40.0000 meq | EXTENDED_RELEASE_TABLET | Freq: Once | ORAL | Status: AC
  Administered 2017-05-13: 40 meq via ORAL
  Filled 2017-05-13: qty 2

## 2017-05-13 MED ORDER — DEXTROSE-NACL 5-0.9 % IV SOLN: INTRAVENOUS | Status: DC

## 2017-05-13 MED ORDER — CARVEDILOL 6.25 MG PO TABS: 6.2500 mg | ORAL_TABLET | Freq: Two times a day (BID) | ORAL | Status: DC

## 2017-05-13 MED ORDER — HYDROCODONE-ACETAMINOPHEN 5-325 MG PO TABS: 1.0000 | ORAL_TABLET | ORAL | Status: DC | PRN

## 2017-05-13 MED ORDER — SODIUM CHLORIDE 0.9 % IV SOLN
INTRAVENOUS | Status: AC
  Administered 2017-05-13: 100 mL/h via INTRAVENOUS

## 2017-05-13 MED ORDER — SODIUM CHLORIDE 0.9 % IV SOLN
INTRAVENOUS | Status: DC
  Administered 2017-05-13: 4.9 [IU]/h via INTRAVENOUS
  Filled 2017-05-13 (×2): qty 1

## 2017-05-13 MED ORDER — PERFLUTREN LIPID MICROSPHERE
1.0000 mL | INTRAVENOUS | Status: AC | PRN
  Administered 2017-05-13: 2 mL via INTRAVENOUS
  Filled 2017-05-13: qty 10

## 2017-05-13 MED ORDER — ONDANSETRON HCL 4 MG/2ML IJ SOLN: 4.0000 mg | Freq: Four times a day (QID) | INTRAMUSCULAR | Status: DC | PRN

## 2017-05-13 MED ORDER — SODIUM CHLORIDE 0.9 % IV BOLUS: 1000.0000 mL | Freq: Once | INTRAVENOUS | Status: AC

## 2017-05-13 NOTE — Consult Note (Signed)
Neurology Consultation Reason for Consult: Stroke Referring Physician: Florene Glen, C  CC: Stroke  History is obtained from: Patient  HPI: [name] [name] is a 62 y.o. male with a history of atrial fibrillation on chronic anticoagulation with Xarelto who presents with medical noncompliance resulting in severe hyperglycemia.  On admission, he complained of some left leg weakness which has since resolved, however due to this he received an MRI of his brain which does demonstrate 2 small foci of acute ischemia.  He has given different answers about exactly when his left leg became numb, and I think it is relatively unclear, however he does state that it is completely resolved at this point.  Of note, he also has a pill-rolling tremor of the right arm.  LKW: Unclear tpa given?: no, unclear time of onset    ROS: A 14 point ROS was performed and is negative except as noted in the HPI.   Past Medical History:  Diagnosis Date  . Atrial fibrillation ([place])   . Cancer [facility])    Prostate cancer-bx. 3 weeks ago  . Cataract   . Chronic combined systolic and diastolic CHF (congestive heart failure) (HCC)    a) EF 40-45% per 2D echo (02/2012) with grade 1 DD b)  NICM c) RHC (04/2012): RA: 4, RV 45/3/4, PA 42/9 (24), PCWP 14, Fick CO/CI: 5.2 /2.2, PVR 1.9 WU, PA 62% and 64% d) ECHO (10/2012) EF 40-45%, grade II DD, RV nl  . CKD (chronic kidney disease) stage 2, GFR 60-89 ml/min    BL SCr approximately 1-1.3  . Colitis 05/2009   History of colitis of ascending colon noted on CT abd/pelvis (05/2009), with interval resolution with subsequent CT  . Continuous chronic alcoholism ([place])   . Degenerative lumbar spinal stenosis    s/p L2-3, L3-4, L4-5 laminectomy partial facetectomy, and bilateral foraminotomy  . Diabetes mellitus without complication ([place])    Type II  . Dysrhythmia    A. Fib  . Family history of adverse reaction to anesthesia    "sister, can't go to sleep"  . GERD (gastroesophageal reflux  disease)   . H/O cocaine abuse   . Hepatic steatosis    suspected 2/2 alcohol abuse  . Hepatitis C   . History of pancreatitis 01/2011   Admission for acute pancreatitis presumed 2/2 ongoing alcohol abuse- and hypertriglyceridemia  . History of pneumonia   . HIT (heparin-induced thrombocytopenia) ([place])   . Hyperlipidemia   . Hypertension    uncontrolled with medication noncompliance  . Hypertriglyceridemia   . NICM (nonischemic cardiomyopathy) ([place])    a. LHC (04/2012): nl arteries  . PFO (patent foramen ovale) 01/2012   with right to left shunt, noted per TEE in evaluation for source of embolic stroke in 01/930  . Rhabdomyolysis 02/22/2012   H/O rhabdomyolysis in 01/2012 that was idiopathic, cause never identified  . Schizophrenia, schizo-affective ([place])   . Shortness of breath dyspnea   . Splenic cyst   . Stroke ([place]) 01/2012   Small cerebellar infarcts right greater than left as well as questionable acute left external capsule and caudate nuclear punctate lacunar infarcts noted per MRI (01/2012) - presumed to be embolic likely source PFO with right to left shunt (noted per TEE 01/ 2014)     Family History  Problem Relation Age of Onset  . CAD Mother 55       deceased  . CAD Sister   . CAD Brother 74       died from  MI at age 76yo  . Hypertension Unknown      Social History:  reports that he quit smoking about 15 years ago. His smoking use included cigarettes. He has a 30.00 pack-year smoking history. He has never used smokeless tobacco. He reports that he drinks alcohol. He reports that he has current or past drug history. Drug: "Crack" cocaine.   Exam: Current vital signs: BP (!) 161/70 (BP Location: Left Arm)   Pulse 82   Temp 98.4 F (36.9 C) (Oral)   Resp 13   Ht 5\' 11"  (1.803 m)   Wt 123.8 kg (273 lb)   SpO2 98%   BMI 38.08 kg/m  Vital signs in last 24 hours: Temp:  [97.5 F (36.4 C)-98.6 F (37 C)] 98.4 F (36.9 C) (04/18 1905) Pulse Rate:  [36-136] 82  (04/18 2000) Resp:  [0-29] 13 (04/18 2000) BP: (58-163)/(32-90) 161/70 (04/18 2000) SpO2:  [90 %-99 %] 98 % (04/18 2000) Weight:  [123.8 kg (273 lb)] 123.8 kg (273 lb) (04/17 2326)   Physical Exam  Constitutional: Appears well-developed and well-nourished.  Psych: Affect appropriate to situation Eyes: No scleral injection HENT: No OP obstrucion Head: Normocephalic.  Cardiovascular: Normal rate and regular rhythm.  Respiratory: Effort normal, non-labored breathing GI: Soft.  No distension. There is no tenderness.  Skin: WDI  Neuro: Mental Status: Patient is awake, alert, oriented to person, place, month, year, and situation. Patient is able to give a clear and coherent history. No signs of aphasia or neglect Cranial Nerves: II: Visual Fields are full. Pupils are equal, round, and reactive to light.   III,IV, VI: He has disconjugate gaze with right eye exophoria (patient states this is baseline) V: Facial sensation is symmetric to temperature VII: Facial movement is mildly reduced on the left VIII: hearing is intact to voice X: Uvula elevates symmetrically XI: Shoulder shrug is symmetric. XII: tongue is midline without atrophy or fasciculations.  Motor: Tone is normal. Bulk is normal. 5/5 strength was present in all four extremities.   Of note he has a parkinsonian tremor of the right arm. Sensory: Sensation is symmetric but decreased in a stocking distribution to temperature Cerebellar: FNF and HKS are intact bilaterally   I have reviewed labs in epic and the results pertinent to this consultation are: CMP-unremarkable  I have reviewed the images obtained: MRI brain-small foci bilaterally  Impression: 62 year old male who presents with 2 foci of acute ischemia in the setting of severe hyperglycemia.  I suspect that this is due to medication noncompliance with Xarelto.  The strokes are small enough that I think that the risk of hemorrhagic conversion is very small and  therefore would continue Xarelto at this time.  Given the bilateral nature, I do not think that carotid Dopplers would be very helpful.  An echo could be helpful, but in any case I think we would continue anticoagulation.  I very briefly saw a tremor that could be concerning for parkinsonism, but he does not have increased tone and I would be hesitant to make any diagnosis in the setting.  He is on a very high dose of quetiapine which certainly could be contributing to that.   Recommendations: 1) echocardiogram 2) LDL, increase lipid therapy if LDL is greater than 70 3) better glucose control 4) Xarelto compliance 5) PT, OT, ST 6) follow-up with outpatient neurology 7) no further recommendations at this time, please call neurology with any further questions or concerns.   Roland Rack, MD [facility] [phone]  If 7pm- 7am, please page neurology on call as listed in [place].

## 2017-05-13 NOTE — Progress Notes (Signed)
PROGRESS NOTE    [name] [name]  XNT:[id number] DOB: January 29, 1955 DOA: 05/12/2017 PCP: System, Pcp Not In  Brief Narrative: [name] [name] is a 62 y.o. male with medical history significant of DM2, CHF 20-25%. A.fib, schizophrenia, CKD stage III, HTN, HLD, COPD presumed, hx of EtOH and cocaine abuse who presented with LLE weakness and hypergylcemia in the setting of running out of his mealtime insulin.    Assessment & Plan:   Active Problems:   HTN (hypertension)   CKD (chronic kidney disease) stage 2, GFR 60-89 ml/min   Schizophrenia (HCC)   Heart failure with reduced ejection fraction, NYHA class III (HCC)   Paroxysmal atrial fibrillation (HCC)   Chronic combined systolic (congestive) and diastolic (congestive) heart failure (HCC)   Elevated troponin   Hyperosmolar non-ketotic state in patient with type 2 diabetes mellitus ([place])   Dehydration   Nonketotic hyperglycinemia, type II (HCC)  Hypotension:  This occurred this morning, with SBP in the 60's.  Suspect this is 2/2 hypovolemia in the setting of osmotic diuresis.  Holding entresto, coreg, spironolactone, torsemide.   S/p 2 L NS yesterday, given an additional 2.5 L today.   Trending trops, EKG similar to prior (more prolonged QT), CXR without edema or effusions.   Lactate as well as blood cx ordered, but no si/sx of infection Continue to monitor closely  AMS: a bit drowsy this morning, A&Ox3, but very sleepy.  Possibly related to above as seems to have improved with IVF.  Follow.  Hyperglycemia  HHS  T2DM:    Insulin gtt -> transition to subcutaneous insulin (lantus 40) in addition to mealtime (lispro 9) with SSI (normal home regimen is 50 of lantus with 18 units lispro with meals) Follow A1c Diabetic coordinator Case management for resources for meds  Left leg weakness  Blurry vision- Both are now resolved.  The LLE weakness occurred on presentation.  He noted the blurry vision was transient and occurred this  morning.  Head CT on presentation without intracranial abnormality.  He notes LLE weakness improved after insulin was started.  His blurry vision was noted when his BP's were low, when his BP's were improved after IVF, he noted to me that this had resolved (he did not mention this to me initially).   Follow [facility] today (pt did not fit in scanner at [facility]) Reconsult neuro prn PT/OT  HFpEF  HFrEF: grade 2 diastolic dysfunction and ef 2--25% on echo from 09/2016.  Holding coreg, entresto, spironolactone, and torsemide given hypotension above.   Follow repeat echo  CKD (chronic kidney disease) stage 2, GFR 60-89 ml/min: continue to monitor  HTN (hypertension) - holding meds as noted above   Paroxysmal atrial fibrillation (HCC) -  continue xarelto for anticoagulation   Schizophrenia ([place]) we will restart home medications (seroquel)  Elevated troponin: flat, follow troponin.  Follow echo.     Hypokalemia: replete, follow mag  Hx Etoh  Cocaine abuse: follow utox  DVT prophylaxis: pta xarelto Code Status: full  Family Communication: none at bedside Disposition Plan: pending improvement in BG   Consultants:   none  Procedures:   none  Antimicrobials:   none    Subjective: Sleepy.   A&Ox3.   Notes he came for LLE weakness.  Yesterday around noon.  Lasted for ~4 hours.  Improved as he got insulin.    Objective: Vitals:   05/13/17 1230 05/13/17 1300 05/13/17 1330 05/13/17 1400  BP: 92/63 114/72 114/66 125/78  Pulse: 84 92  83 87  Resp: (!) 21 (!) 29 (!) 25 (!) 23  Temp:      TempSrc:      SpO2: 99% 98% 96% 96%  Weight:      Height:        Intake/Output Summary (Last 24 hours) at 05/13/2017 1421 Last data filed at 05/13/2017 1200 Gross per 24 hour  Intake 3343.97 ml  Output 350 ml  Net 2993.97 ml   Filed Weights   05/12/17 2326  Weight: 123.8 kg (273 lb)    Examination:  General exam: Appears calm and comfortable.  Drowsy.   Respiratory system: Clear to  auscultation. Respiratory effort normal. Cardiovascular system: S1 & S2 heard, RRR. No JVD, murmurs, rubs, gallops or clicks. No pedal edema. Gastrointestinal system: Abdomen is nondistended, soft and nontender. No organomegaly or masses felt. Normal bowel sounds heard. Central nervous system: Alert and oriented. CN 2-12 intact, 5/5 strength to upper and lower extremities.  Sensation intact to light touch. Extremities: Symmetric 5 x 5 power. Skin: No rashes, lesions or ulcers Psychiatry: Judgement and insight appear normal. Mood & affect appropriate.   Data Reviewed: I have personally reviewed following labs and imaging studies  CBC: Recent Labs  Lab 05/12/17 1547 05/13/17 0317  WBC 4.7 6.1  HGB 18.0* 15.8  HCT 50.8 45.2  MCV 82.9 85.1  PLT 193 347   Basic Metabolic Panel: Recent Labs  Lab 05/12/17 1547 05/12/17 1939 05/12/17 2351 05/13/17 0317  NA 120* 131* 137 136  K 5.3* 4.0 3.8 3.3*  CL 86* 96* 103 102  CO2 18* 24 23 22   GLUCOSE 1,021* 610* 191* 133*  BUN 25* 22* 22* 22*  CREATININE 1.41* 1.41* 1.11 0.90  CALCIUM 10.7* 9.8 10.4* 10.1  MG  --  2.2  --  2.0  PHOS  --  2.4*  --  3.2   GFR: Estimated Creatinine Clearance: 112.5 mL/min (by C-G formula based on SCr of 0.9 mg/dL). Liver Function Tests: Recent Labs  Lab 05/12/17 1939 05/13/17 0317  AST 26 17  ALT 22 20  ALKPHOS 104 98  BILITOT 1.4* 1.0  PROT 7.4 7.4  ALBUMIN 3.9 3.7   No results for input(s): LIPASE, AMYLASE in the last 168 hours. No results for input(s): AMMONIA in the last 168 hours. Coagulation Profile: No results for input(s): INR, PROTIME in the last 168 hours. Cardiac Enzymes: Recent Labs  Lab 05/12/17 1655 05/12/17 1939 05/13/17 0140 05/13/17 0805  TROPONINI 0.08* 0.10* 0.10* 0.09*   BNP (last 3 results) No results for input(s): PROBNP in the last 8760 hours. HbA1C: No results for input(s): HGBA1C in the last 72 hours. CBG: Recent Labs  Lab 05/13/17 0902 05/13/17 1016  05/13/17 1103 05/13/17 1242 05/13/17 1359  GLUCAP 204* 148* 143* 170* 335*   Lipid Profile: No results for input(s): CHOL, HDL, LDLCALC, TRIG, CHOLHDL, LDLDIRECT in the last 72 hours. Thyroid Function Tests: Recent Labs    05/13/17 0317  TSH 2.962   Anemia Panel: No results for input(s): VITAMINB12, FOLATE, FERRITIN, TIBC, IRON, RETICCTPCT in the last 72 hours. Sepsis Labs: Recent Labs  Lab 05/13/17 4259  LATICACIDVEN 1.2    Recent Results (from the past 240 hour(s))  MRSA PCR Screening     Status: None   Collection Time: 05/12/17 11:23 PM  Result Value Ref Range Status   MRSA by PCR NEGATIVE NEGATIVE Final    Comment:        The GeneXpert MRSA Assay (FDA approved for NASAL specimens  only), is one component of a comprehensive MRSA colonization surveillance program. It is not intended to diagnose MRSA infection nor to guide or monitor treatment for MRSA infections. Performed at [facility], [place] [address], [place], [place] [address]          Radiology Studies: Dg Chest 2 View  Result Date: 05/12/2017 CLINICAL DATA:  Weakness EXAM: CHEST - 2 VIEW COMPARISON:  11/26/2016 FINDINGS: Cardiac enlargement without heart failure. Lungs are clear without infiltrate or effusion. Chronic right AC separation unchanged. Degenerative change in the left AC joint. IMPRESSION: No active cardiopulmonary disease. Electronically Signed   By: Franchot Gallo M.D.   On: 05/12/2017 18:35   Ct Head Wo Contrast  Result Date: 05/12/2017 CLINICAL DATA:  Hyperglycemic for 2 weeks as patient ran out of short-acting insulin. Weakness since Monday. EXAM: CT HEAD WITHOUT CONTRAST TECHNIQUE: Contiguous axial images were obtained from the base of the skull through the vertex without intravenous contrast. COMPARISON:  02/25/2014 FINDINGS: Brain: No evidence of acute infarction, hemorrhage, hydrocephalus, or extra-axial fluid collections. Small calcified extra-axial densities  overlying both parietal lobes compatible with tiny calcified meningiomas. No underlying mass-effect or edema. Vascular: Atherosclerosis of the carotid siphons. No hyperdense vessel sign. Skull: No acute skull fracture. Sinuses/Orbits: No acute finding. Bilateral lens replacements. Intact orbits and globes. Clear mastoids. Other: Mild supraorbital soft tissue swelling on the right. IMPRESSION: Mild right supraorbital soft tissue swelling. No acute intracranial abnormality. Electronically Signed   By: Ashley Royalty M.D.   On: 05/12/2017 20:12   Dg Chest Port 1 View  Result Date: 05/13/2017 CLINICAL DATA:  Shortness of Breath EXAM: PORTABLE CHEST 1 VIEW COMPARISON:  05/12/2017 FINDINGS: Cardiomegaly. Low lung volumes with bibasilar atelectasis. No overt edema or effusions. No acute bony abnormality. IMPRESSION: Cardiomegaly.  Low volumes with bibasilar atelectasis. Electronically Signed   By: Rolm Baptise M.D.   On: 05/13/2017 09:26        Scheduled Meds: . aspirin EC  81 mg Oral Daily  . insulin aspart  0-15 Units Subcutaneous TID WC  . insulin aspart  0-5 Units Subcutaneous QHS  . insulin aspart  9 Units Subcutaneous TID WC  . insulin glargine  40 Units Subcutaneous Q24H  . insulin regular  0-10 Units Intravenous TID WC  . QUEtiapine  800 mg Oral QHS  . rivaroxaban  20 mg Oral Q breakfast  . rosuvastatin  20 mg Oral q1800   Continuous Infusions: . dextrose 5 % and 0.45% NaCl 125 mL/hr at 05/13/17 1200  . insulin (NOVOLIN-R) infusion 5 Units/hr (05/13/17 1118)  . norepinephrine (LEVOPHED) Adult infusion       LOS: 1 day    Time spent: over 30 min    Fayrene Helper, MD [facility] Pager [phone]  If 7PM-7AM, please contact night-coverage [email or web address] Password [email or web address] 05/13/2017, 2:21 PM

## 2017-05-13 NOTE — Progress Notes (Signed)
Inpatient Diabetes Program Recommendations  AACE/ADA: New Consensus Statement on Inpatient Glycemic Control (2015)  Target Ranges:  Prepandial:   less than 140 mg/dL      Peak postprandial:   less than 180 mg/dL (1-2 hours)      Critically ill patients:  140 - 180 mg/dL   Lab Results  Component Value Date   GLUCAP 170 (H) 05/13/2017   HGBA1C 7.3 (H) 10/01/2016    Review of Glycemic Control  Diabetes history: DM2 Outpatient Diabetes medications: Previously on Lantus 50 units QHS, Humalog 18 units tidwc and metformin 1000 mg bid Current orders for Inpatient glycemic control: Transitioning from IV insulin to Lantus 40 untis Q24H, Novolog 9 units tidwc and 0-15 units tidwc and hs  No insulin or meds x 2 weeks. Pt asleep at visit, spoke with RN regarding pt's diabetes control. Transitioning off drip to SQ insulin. Was discharged from [facility] IM practice. Pt will need PCP for diabetes management.   Inpatient Diabetes Program Recommendations:     Agree with orders. Titrate Lantus if FBS is > 180 mg/dL on 4/19. Need updated HgbA1C to assess glycemic control prior to admission.  Will f/u this afternoon or early am.   Thank you. Lorenda Peck, RD, LDN, CDE Inpatient Diabetes Coordinator [phone]

## 2017-05-13 NOTE — Progress Notes (Signed)
Assumed patient care from Tama Headings, RN at [phone]. Pt is sitting up in chair eating. Will continue to monitor patient.

## 2017-05-13 NOTE — Care Management Note (Signed)
Case Management Note  Patient Details  Name: [name] [name] MRN: [id number] Date of Birth: Mar 19, 1955  Subjective/Objective:62 y/o m admitted w/CKD. From home.CM referral for [facility]- Will await PT cons-await recc.                    Action/Plan:d/c plan home.   Expected Discharge Date:  05/17/17               Expected Discharge Plan:  Home/Self Care  In-House Referral:     Discharge planning Services  CM Consult  Post Acute Care Choice:    Choice offered to:     DME Arranged:    DME Agency:     HH Arranged:    HH Agency:     Status of Service:  In process, will continue to follow  If discussed at Long Length of Stay Meetings, dates discussed:    Additional Comments:  Dessa Phi, RN 05/13/2017, 12:01 PM

## 2017-05-13 NOTE — Progress Notes (Signed)
PT Cancellation Note  Patient Details Name: [name] [name] MRN: [id number] DOB: [birth year]   Cancelled Treatment:    Reason Eval/Treat Not Completed: Patient not medically ready, noted to have low BP, Trop monitored and elevated and Blood gases being drawn. Check back when medically ready to mobilize.    Claretha Cooper 05/13/2017, 9:33 AM Tresa Endo PT [phone]

## 2017-05-13 NOTE — Progress Notes (Signed)
  Echocardiogram 2D Echocardiogram has been performed.  Elmer Ramp 05/13/2017, 3:36 PM

## 2017-05-14 DIAGNOSIS — E729 Disorder of amino-acid metabolism, unspecified: Secondary | ICD-10-CM

## 2017-05-14 LAB — CBC
HCT: 43.7 % (ref 39.0–52.0)
Hemoglobin: 14.5 g/dL (ref 13.0–17.0)
MCH: 29.2 pg (ref 26.0–34.0)
MCHC: 33.2 g/dL (ref 30.0–36.0)
MCV: 88.1 fL (ref 78.0–100.0)
Platelets: 160 K/uL (ref 150–400)
RBC: 4.96 MIL/uL (ref 4.22–5.81)
RDW: 14 % (ref 11.5–15.5)
WBC: 5.5 K/uL (ref 4.0–10.5)

## 2017-05-14 LAB — COMPREHENSIVE METABOLIC PANEL
ALBUMIN: 3.4 g/dL — AB (ref 3.5–5.0)
ALT: 20 U/L (ref 17–63)
AST: 22 U/L (ref 15–41)
Alkaline Phosphatase: 86 U/L (ref 38–126)
Anion gap: 6 (ref 5–15)
BUN: 12 mg/dL (ref 6–20)
CHLORIDE: 108 mmol/L (ref 101–111)
CO2: 20 mmol/L — ABNORMAL LOW (ref 22–32)
Calcium: 9.1 mg/dL (ref 8.9–10.3)
Creatinine, Ser: 0.83 mg/dL (ref 0.61–1.24)
GFR calc Af Amer: >60 mL/min (ref >60)
GLUCOSE: 269 mg/dL — AB (ref 65–99)
POTASSIUM: 3.9 mmol/L (ref 3.5–5.1)
SODIUM: 134 mmol/L — AB (ref 135–145)
Total Bilirubin: 0.9 mg/dL (ref 0.3–1.2)
Total Protein: 6.6 g/dL (ref 6.5–8.1)

## 2017-05-14 LAB — GLUCOSE, CAPILLARY
GLUCOSE-CAPILLARY: 341 mg/dL — AB (ref 65–99)
GLUCOSE-CAPILLARY: 373 mg/dL — AB (ref 65–99)
Glucose-Capillary: 307 mg/dL — ABNORMAL HIGH (ref 65–99)

## 2017-05-14 LAB — LIPID PANEL
CHOLESTEROL: 243 mg/dL — AB (ref 0–200)
HDL: 29 mg/dL — AB (ref >40)
LDL CALC: UNDETERMINED mg/dL (ref 0–99)
Total CHOL/HDL Ratio: 8.4 RATIO
Triglycerides: 454 mg/dL — ABNORMAL HIGH (ref <150)
VLDL: UNDETERMINED mg/dL (ref 0–40)

## 2017-05-14 LAB — HEMOGLOBIN A1C
Hgb A1c MFr Bld: 14.7 % — ABNORMAL HIGH (ref 4.8–5.6)
Mean Plasma Glucose: 375.19 mg/dL

## 2017-05-14 LAB — MAGNESIUM: Magnesium: 1.8 mg/dL (ref 1.7–2.4)

## 2017-05-14 MED ORDER — SPIRONOLACTONE 12.5 MG HALF TABLET
12.5000 mg | ORAL_TABLET | Freq: Every day | ORAL | Status: DC
  Administered 2017-05-14: 12.5 mg via ORAL
  Filled 2017-05-14: qty 1

## 2017-05-14 MED ORDER — INSULIN GLARGINE 100 UNIT/ML SOLOSTAR PEN: 50.0000 [IU] | PEN_INJECTOR | Freq: Every day | SUBCUTANEOUS | 0 refills | Status: DC

## 2017-05-14 MED ORDER — TORSEMIDE 20 MG PO TABS
20.0000 mg | ORAL_TABLET | Freq: Every day | ORAL | Status: DC
  Filled 2017-05-14: qty 1

## 2017-05-14 MED ORDER — TORSEMIDE 20 MG PO TABS
40.0000 mg | ORAL_TABLET | Freq: Every day | ORAL | Status: DC
  Administered 2017-05-14: 40 mg via ORAL
  Filled 2017-05-14: qty 2

## 2017-05-14 MED ORDER — CARVEDILOL 6.25 MG PO TABS
6.2500 mg | ORAL_TABLET | Freq: Two times a day (BID) | ORAL | Status: DC
  Administered 2017-05-14: 6.25 mg via ORAL
  Filled 2017-05-14: qty 1

## 2017-05-14 MED ORDER — ROSUVASTATIN CALCIUM 20 MG PO TABS: 40.0000 mg | ORAL_TABLET | Freq: Every day | ORAL | Status: DC

## 2017-05-14 MED ORDER — METFORMIN HCL 500 MG PO TABS
1000.0000 mg | ORAL_TABLET | Freq: Two times a day (BID) | ORAL | Status: DC
  Administered 2017-05-14: 1000 mg via ORAL
  Filled 2017-05-14 (×2): qty 2

## 2017-05-14 MED ORDER — SACUBITRIL-VALSARTAN 97-103 MG PO TABS
1.0000 | ORAL_TABLET | Freq: Two times a day (BID) | ORAL | Status: DC
  Administered 2017-05-14: 1 via ORAL
  Filled 2017-05-14 (×2): qty 1

## 2017-05-14 MED ORDER — TORSEMIDE 20 MG PO TABS: 20.0000 mg | ORAL_TABLET | Freq: Two times a day (BID) | ORAL | Status: DC

## 2017-05-14 MED ORDER — INSULIN REGULAR HUMAN 100 UNIT/ML IJ SOLN: INTRAMUSCULAR | 1 refills | Status: DC

## 2017-05-14 MED ORDER — ROSUVASTATIN CALCIUM 40 MG PO TABS: 40.0000 mg | ORAL_TABLET | Freq: Every day | ORAL | 0 refills | Status: DC

## 2017-05-14 NOTE — Evaluation (Signed)
Speech Language Pathology Evaluation Patient Details Name: [name] [name] MRN: [id number] DOB: 01-14-1956 Today's Date: 05/14/2017 Time: 7989-2119 SLP Time Calculation (min) (ACUTE ONLY): 12 min  Problem List:  Patient Active Problem List   Diagnosis Date Noted  . Chronic combined systolic (congestive) and diastolic (congestive) heart failure ([place]) 05/12/2017  . Elevated troponin 05/12/2017  . Hyperosmolar non-ketotic state in patient with type 2 diabetes mellitus ([place]) 05/12/2017  . Dehydration 05/12/2017  . Nonketotic hyperglycinemia, type II ([place]) 05/12/2017  . AKI (acute kidney injury) ([place])   . Acute on chronic respiratory failure with hypoxia ([place])   . Acute on chronic diastolic (congestive) heart failure ([place])   . Chest pain 11/27/2016  . SOB (shortness of breath) 11/26/2016  . Acute exacerbation of CHF (congestive heart failure) ([place]) 10/01/2016  . Acute on chronic systolic CHF (congestive heart failure) ([place]) 10/01/2016  . Diabetes ([place])   . Overgrown toenails 09/10/2015  . Heart failure with reduced ejection fraction, NYHA class III ([place]) 06/07/2015  . Hepatitis C antibody test positive 06/07/2015  . Paroxysmal atrial fibrillation ([place]) 06/07/2015  . Schizophrenia ([place]) 03/06/2014  . Generalized weakness 08/04/2013  . Carpal tunnel syndrome 04/11/2013  . Chronic back pain greater than 3 months duration 07/19/2012  . Gastric ulcer with hemorrhage 06/25/2012  . CKD (chronic kidney disease) stage 2, GFR 60-89 ml/min   . CVA (cerebral infarction) 02/29/2012  . HTN (hypertension) 02/20/2012   Past Medical History:  Past Medical History:  Diagnosis Date  . Atrial fibrillation ([place])   . Cancer [facility])    Prostate cancer-bx. 3 weeks ago  . Cataract   . Chronic combined systolic and diastolic CHF (congestive heart failure) ([facility])    a) EF 40-45% per 2D echo (02/2012) with grade 1 DD b)  NICM c) RHC (04/2012): RA: 4, RV 45/3/4, PA 42/9 (24), PCWP 14, Fick CO/CI: 5.2 /2.2,  PVR 1.9 WU, PA 62% and 64% d) ECHO (10/2012) EF 40-45%, grade II DD, RV nl  . CKD (chronic kidney disease) stage 2, GFR 60-89 ml/min    BL SCr approximately 1-1.3  . Colitis 05/2009   History of colitis of ascending colon noted on CT abd/pelvis (05/2009), with interval resolution with subsequent CT  . Continuous chronic alcoholism ([place])   . Degenerative lumbar spinal stenosis    s/p L2-3, L3-4, L4-5 laminectomy partial facetectomy, and bilateral foraminotomy  . Diabetes mellitus without complication ([place])    Type II  . Dysrhythmia    A. Fib  . Family history of adverse reaction to anesthesia    "sister, can't go to sleep"  . GERD (gastroesophageal reflux disease)   . H/O cocaine abuse   . Hepatic steatosis    suspected 2/2 alcohol abuse  . Hepatitis C   . History of pancreatitis 01/2011   Admission for acute pancreatitis presumed 2/2 ongoing alcohol abuse- and hypertriglyceridemia  . History of pneumonia   . HIT (heparin-induced thrombocytopenia) ([place])   . Hyperlipidemia   . Hypertension    uncontrolled with medication noncompliance  . Hypertriglyceridemia   . NICM (nonischemic cardiomyopathy) ([place])    a. LHC (04/2012): nl arteries  . PFO (patent foramen ovale) 01/2012   with right to left shunt, noted per TEE in evaluation for source of embolic stroke in 04/1738  . Rhabdomyolysis 02/22/2012   H/O rhabdomyolysis in 01/2012 that was idiopathic, cause never identified  . Schizophrenia, schizo-affective ([place])   . Shortness of breath dyspnea   . Splenic cyst   .  Stroke ([place]) 01/2012   Small cerebellar infarcts right greater than left as well as questionable acute left external capsule and caudate nuclear punctate lacunar infarcts noted per MRI (01/2012) - presumed to be embolic likely source PFO with right to left shunt (noted per TEE 01/ 2014)   Past Surgical History:  Past Surgical History:  Procedure Laterality Date  . ACHILLES TENDON REPAIR Right 2007   "it was torn"  . BACK  SURGERY    . CARDIAC CATHETERIZATION N/A 09/02/2015   Procedure: Right Heart Cath;  Surgeon: Jolaine Artist, MD;  Location: [facility];  Service: Cardiovascular;  Laterality: N/A;  . CATARACT EXTRACTION W/ INTRAOCULAR LENS IMPLANT Right   . ESOPHAGOGASTRODUODENOSCOPY N/A 06/25/2012   Procedure: ESOPHAGOGASTRODUODENOSCOPY (EGD);  Surgeon: Milus Banister, MD;  Location: [facility];  Service: Endoscopy;  Laterality: N/A;  . I&D EXTREMITY  03/20/2011   Procedure: IRRIGATION AND DEBRIDEMENT EXTREMITY;  Surgeon: Kerin Salen, MD;  Location: [facility];  Service: Orthopedics;  Laterality: Left;  I&D LEFT ACHILLIES TENDON  . KNEE ARTHROSCOPY Left   . LUMBAR LAMINECTOMY     L2-3, L3-4, L4-5 laminectomy, partial facetectomy  . RESECTION DISTAL CLAVICAL  09/17/2011   Procedure: RESECTION DISTAL CLAVICAL;  Surgeon: Nita Sells, MD;  Location: [facility];  Service: Orthopedics;  Laterality: Right;  right shoulder arthroscopy with sad and open distal clavicle excision   . ROBOT ASSISTED LAPAROSCOPIC RADICAL PROSTATECTOMY N/A 12/16/2012   Procedure: ROBOTIC ASSISTED LAPAROSCOPIC PROSTATECTOMY ;  Surgeon: Ardis Hughs, MD;  Location: [facility];  Service: Urology;  Laterality: N/A;  . TONSILLECTOMY     HPI:  62 y.o.malewith a history of atrial fibrillation on chronic anticoagulation with Xarelto who presents with medical noncompliance resulting in severe hyperglycemia. On admission, he complained of some left leg weakness which has since resolved, however due to this he received an MRI of his brain which does demonstrate 2 small foci of acute ischemia (2-3 mm left and right prefrontal cortex). Hx also includes DM, schizophrenia, CKD stage III, HTN, COPD, hx ETOH/cocaine abuse.    Assessment / Plan / Recommendation Clinical Impression  Pt presents with functional cognition with regard to attention, working memory, and basic problem solving; receptive and expressive  language are WNL.  Speech is clear without dysarthria and is fluent.  Writing is at baseline.  No SLP needs identified - our services will sign off.     SLP Assessment  SLP Recommendation/Assessment: Patient does not need any further Speech Lanaguage Pathology Services    Follow Up Recommendations  None    Frequency and Duration           SLP Evaluation Cognition  Overall Cognitive Status: Within Functional Limits for tasks assessed Arousal/Alertness: Awake/alert Orientation Level: Oriented X4 Attention: Selective Selective Attention: Appears intact Memory: Appears intact Awareness: Appears intact Problem Solving: Appears intact Safety/Judgment: Appears intact       Comprehension  Auditory Comprehension Overall Auditory Comprehension: Appears within functional limits for tasks assessed Yes/No Questions: Within Functional Limits Commands: Within Functional Limits Visual Recognition/Discrimination Discrimination: Within Function Limits Reading Comprehension Reading Status: Not tested    Expression Expression Primary Mode of Expression: Verbal Verbal Expression Overall Verbal Expression: Appears within functional limits for tasks assessed Initiation: No impairment Level of Generative/Spontaneous Verbalization: Conversation Repetition: No impairment Naming: No impairment Pragmatics: No impairment Written Expression Dominant Hand: Right Written Expression: Within Functional Limits   Oral / Motor  Oral Motor/Sensory Function Overall Oral Motor/Sensory  Function: Within functional limits Motor Speech Overall Motor Speech: Appears within functional limits for tasks assessed   GO                    Juan Quam Laurice 05/14/2017, 11:55 AM

## 2017-05-14 NOTE — Care Management Note (Signed)
Case Management Note  Patient Details  Name: EBERARDO DEMELLO MRN: [id number] Date of Birth: 1955/11/06  Subjective/Objective:  CM referral for pcp-provided patient w/[facility] pcp listing-patient will call for pcp hospital f/u on own. No further CM needs.                  Action/Plan:d/c home.   Expected Discharge Date:  05/14/17               Expected Discharge Plan:  Home/Self Care  In-House Referral:     Discharge planning Services  CM Consult  Post Acute Care Choice:    Choice offered to:     DME Arranged:    DME Agency:     HH Arranged:    HH Agency:     Status of Service:  Completed, signed off  If discussed at [employer] of Stay Meetings, dates discussed:    Additional Comments:  Dessa Phi, RN 05/14/2017, 2:50 PM

## 2017-05-14 NOTE — Evaluation (Signed)
Physical Therapy Evaluation Patient Details Name: [name] [name] MRN: [id number] DOB: 1955/10/13 Today's Date: 05/14/2017   History of Present Illness  [name] [name] is a 62 y.o. male with medical history significant of DM2, CHF , A.fib, schizophrenia, CKD stage III, HTN, HLD, COPD presumed, hx of EtOH and cocaine abuse who presented with LLE weakness and hypergylcemia in the setting of running out of his mealtime insulin.  He complained of some left leg weakness which has since resolved, however due to this he received an MRI of his brain which does demonstrate 2 small foci of acute ischemia.    Clinical Impression  The patient is  Mobilizing well, ambulating without assistance. Patient reports having caregivers and and Personal care services. Pt admitted with above diagnosis. Pt currently with functional limitations due to the deficits listed below (see PT Problem List).  Pt will benefit from skilled PT to increase their independence and safety with mobility to allow discharge to the venue listed below.       Follow Up Recommendations No PT follow up    Equipment Recommendations  None recommended by PT    Recommendations for Other Services       Precautions / Restrictions Precautions Precautions: Fall      Mobility  Bed Mobility               General bed mobility comments: in recliner  Transfers Overall transfer level: Needs assistance Equipment used: None Transfers: Sit to/from Stand Sit to Stand: Supervision            Ambulation/Gait Ambulation/Gait assistance: Supervision Ambulation Distance (Feet): 160 Feet Assistive device: None Gait Pattern/deviations: WFL(Within Functional Limits);Wide base of support     General Gait Details: wide base but steady gait. HR high 120's  Stairs            Wheelchair Mobility    Modified Rankin (Stroke Patients Only)       Balance Overall balance assessment: Independent                                            Pertinent Vitals/Pain Pain Assessment: No/denies pain    Home Living Family/patient expects to be discharged to:: Private residence Living Arrangements: Other relatives Available Help at Discharge: Family;Personal care attendant;Available PRN/intermittently Type of Home: Apartment Home Access: Level entry     Home Layout: One level Home Equipment: Cane - single point;Grab bars - tub/shower      Prior Function Level of Independence: Independent with assistive device(s)         Comments: uses cane at times, reports at times limited due to SOB; has aide 3 hours a day 7 days a week     Hand Dominance        Extremity/Trunk Assessment        Lower Extremity Assessment Lower Extremity Assessment: Overall WFL for tasks assessed    Cervical / Trunk Assessment Cervical / Trunk Assessment: Normal  Communication   Communication: No difficulties  Cognition Arousal/Alertness: Awake/alert Behavior During Therapy: WFL for tasks assessed/performed Overall Cognitive Status: Within Functional Limits for tasks assessed                                        General Comments  Exercises     Assessment/Plan    PT Assessment Patient needs continued PT services  PT Problem List Decreased activity tolerance;Decreased mobility;Decreased knowledge of precautions       PT Treatment Interventions Gait training    PT Goals (Current goals can be found in the Care Plan section)  Acute Rehab PT Goals Patient Stated Goal: to go home PT Goal Formulation: With patient Time For Goal Achievement: 05/21/17 Potential to Achieve Goals: Good    Frequency Min 2X/week   Barriers to discharge        Co-evaluation PT/OT/SLP Co-Evaluation/Treatment: Yes Reason for Co-Treatment: For patient/therapist safety PT goals addressed during session: Mobility/safety with mobility OT goals addressed during session: ADL's and self-care        AM-PAC PT "6 Clicks" Daily Activity  Outcome Measure Difficulty turning over in bed (including adjusting bedclothes, sheets and blankets)?: None Difficulty moving from lying on back to sitting on the side of the bed? : None Difficulty sitting down on and standing up from a chair with arms (e.g., wheelchair, bedside commode, etc,.)?: A Little Help needed moving to and from a bed to chair (including a wheelchair)?: A Little Help needed walking in hospital room?: A Little Help needed climbing 3-5 steps with a railing? : A Lot 6 Click Score: 19    End of Session Equipment Utilized During Treatment: Gait belt Activity Tolerance: Patient tolerated treatment well Patient left: in chair;with call bell/phone within reach;with chair alarm set Nurse Communication: Mobility status PT Visit Diagnosis: Unsteadiness on feet (R26.81)    Time: 9767-3419 PT Time Calculation (min) (ACUTE ONLY): 21 min   Charges:   PT Evaluation $PT Eval Low Complexity: 1 Low     PT G CodesTresa [name] PT [phone]   [name] [name] 05/14/2017, 9:24 AM

## 2017-05-14 NOTE — Discharge Summary (Signed)
Physician Discharge Summary  [name] DBZ:[id number] DOB: 13-Apr-1955 DOA: 05/12/2017  PCP: System, Pcp Not In  Admit date: 05/12/2017 Discharge date: 05/14/2017  Admitted From: Home Disposition: Home  Discharge Condition: Stable CODE STATUS:Full Diet recommendation:  Carb Modified  Brief/Interim Summary: [name] Faster Williamsis a 62 y.o.malewith medical history significant of DM2, CHF 20-25%. A.fib, schizophrenia,CKD stage III,HTN, HLD, COPD presumed, hx of EtOH and cocaine abuse who presented with LLE weakness and hypergylcemia in the setting of running out of his mealtime insulin.   Patient was found to be in nonketotic hyperglycemia .  Started with insulin .  Diabetic coordinator consulted.  Resumed his home insulin regimen.  Insulin doses adjusted on discharge.  Patient has history of noncompliance. Patient complained of left lower extremity weakness and blurry vision during hospitalization.  Neurology consulted.  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  Patient is on Xarelto for history of paroxysmal A. fib.  Continue on Xarelto His overall status is stable.  He was divided with PT/OT, no specific recommendation. Patient stable for discharge to home today.  Following problems were addressed during his hospitalization:  AMS:  Currently alert and oriented.  Mental status is at baseline.  Hyperglycemia  HHS  T2DM:    Insulin gtt -> transition to subcutaneous insulin .recommended to take 15 units of Lantus along with 2 units of regular insulin at home.  Hemoglobin A1c 14.7. Diabetic coordinator was following.  Left leg weakness Blurry vision- Resolved.  Head CT on presentation without intracranial abnormality.  Follow MRI finding as above.  Follow-up with neurology as an outpatient.  Dose of rosuvastatin increased. PT/OT  HFpEF  HFrEF: Echocardiogram done and showed ejection fraction of 30 to 12%, grade 1 diastolic dysfunction.  This  finding similar to his previous echo.Contine coreg, entresto, spironolactone, and torsemide.  CKD (chronic kidney disease) stage 2, GFR 60-89 ml/min:   HTN (hypertension)- Continue home meds.  Blood pressure stable.  Paroxysmal atrial fibrillation ([name])- continue xarelto for anticoagulation  Schizophrenia (HCC)-we will restart home medications (seroquel)  Elevated troponin:Remained stable.  Patient does not complain of any chest pain  Hypokalemia: Repleted  Hx Etoh  Cocaine abuse: Counseled for cessation.   Discharge Diagnoses:  Active Problems:   HTN (hypertension)   CKD (chronic kidney disease) stage 2, GFR 60-89 ml/min   Generalized weakness   Schizophrenia (HCC)   Heart failure with reduced ejection fraction, NYHA class III (HCC)   Paroxysmal atrial fibrillation (HCC)   Chronic combined systolic (congestive) and diastolic (congestive) heart failure (HCC)   Elevated troponin   Hyperosmolar non-ketotic state in patient with type 2 diabetes mellitus ([name])   Dehydration   Nonketotic hyperglycinemia, type II [name])    Discharge Instructions  Discharge Instructions    Diet - low sodium heart healthy   Complete by:  As directed    Discharge instructions   Complete by:  As directed    1) Take prescribed medications as instructed. 2) Continue taking insulin at home.  Continue to monitor your blood sugars.  Make a logbook of her blood sugars  and follow-up with your PCP.  Check hemoglobin A1c in 3 months.  Follow-up with Endocrinology as an outpatient. 3) Do a CBC and BMP test in a week. 4) Follow up with neurology in 1 to 2 weeks.  Name and number of the provider has been attached.   Increase activity slowly   Complete by:  As directed      Allergies as  of 05/14/2017      Reactions   Heparin Other (See Comments)   Documented HIT under problem list Pt states he's not allergic. " They told me not take it anymore"   Thorazine [chlorpromazine Hcl] Other (See  Comments)   Body freezes up      Medication List    STOP taking these medications   insulin lispro 100 UNIT/ML KiwkPen Commonly known as:  HUMALOG KWIKPEN     TAKE these medications   ACCU-CHEK FASTCLIX LANCETS Misc Check blood sugar 4 times a day before meals and bedtime   ACCU-CHEK GUIDE w/Device Kit 1 each by Does not apply route 4 (four) times daily.   B-D UF III MINI PEN NEEDLES 31G X 5 MM Misc Generic drug:  Insulin Pen Needle Use four times daily as directed. Dx code E11.00, Z79.4   carvedilol 6.25 MG tablet Commonly known as:  COREG take 1 tablet by mouth twice a day with meals   glucose blood test strip Commonly known as:  ACCU-CHEK GUIDE Check blood sugar 4 times a day before meal and bedtime   Insulin Glargine 100 UNIT/ML Solostar Pen Commonly known as:  LANTUS SOLOSTAR Inject 50 Units into the skin daily at 10 pm.   insulin regular 100 units/mL injection Commonly known as:  NOVOLIN R,HUMULIN R 3 times with meals.Take 30 minutes before meal.   metFORMIN 1000 MG tablet Commonly known as:  GLUCOPHAGE Take 1 tablet (1,000 mg total) by mouth 2 (two) times daily with a meal.   QUEtiapine 400 MG tablet Commonly known as:  SEROQUEL Take 800 mg by mouth at bedtime.   rivaroxaban 20 MG Tabs tablet Commonly known as:  XARELTO Take 1 tablet (20 mg total) by mouth daily.   rosuvastatin 40 MG tablet Commonly known as:  CRESTOR Take 1 tablet (40 mg total) by mouth daily. Start taking on:  05/15/2017 What changed:    medication strength  how much to take   sacubitril-valsartan 97-103 MG Commonly known as:  ENTRESTO Take 1 tablet by mouth 2 (two) times daily.   spironolactone 25 MG tablet Commonly known as:  ALDACTONE Take 0.5 tablets (12.5 mg total) by mouth daily.   torsemide 20 MG tablet Commonly known as:  DEMADEX Take 40 mg (2 tabs) in am and 20 mg (1 tab) in pm. What changed:    how much to take  how to take this  when to take  this  additional instructions   VRAYLAR capsule Generic drug:  cariprazine Take 3 mg by mouth at bedtime.      Follow-up Information    [facility] [place]. Schedule an appointment as soon as possible for a visit in 1 week(s).   Specialty:  Neurology Contact information: [address], [address]  [place] [address] [phone]         Allergies  Allergen Reactions  . Heparin Other (See Comments)    Documented HIT under problem list Pt states he's not allergic. " They told me not take it anymore"  . Thorazine [Chlorpromazine Hcl] Other (See Comments)    Body freezes up    Consultations: Neurology  Procedures/Studies: Dg Chest 2 View  Result Date: 05/12/2017 CLINICAL DATA:  Weakness EXAM: CHEST - 2 VIEW COMPARISON:  11/26/2016 FINDINGS: Cardiac enlargement without heart failure. Lungs are clear without infiltrate or effusion. Chronic right AC separation unchanged. Degenerative change in the left AC joint. IMPRESSION: No active cardiopulmonary disease. Electronically Signed   By: Juanda Crumble  Carlis Abbott M.D.   On: 05/12/2017 18:35   Ct Head Wo Contrast  Result Date: 05/12/2017 CLINICAL DATA:  Hyperglycemic for 2 weeks as patient ran out of short-acting insulin. Weakness since Monday. EXAM: CT HEAD WITHOUT CONTRAST TECHNIQUE: Contiguous axial images were obtained from the base of the skull through the vertex without intravenous contrast. COMPARISON:  02/25/2014 FINDINGS: Brain: No evidence of acute infarction, hemorrhage, hydrocephalus, or extra-axial fluid collections. Small calcified extra-axial densities overlying both parietal lobes compatible with tiny calcified meningiomas. No underlying mass-effect or edema. Vascular: Atherosclerosis of the carotid siphons. No hyperdense vessel sign. Skull: No acute skull fracture. Sinuses/Orbits: No acute finding. Bilateral lens replacements. Intact orbits and globes. Clear mastoids. Other: Mild supraorbital  soft tissue swelling on the right. IMPRESSION: Mild right supraorbital soft tissue swelling. No acute intracranial abnormality. Electronically Signed   By: Ashley Royalty M.D.   On: 05/12/2017 20:12   Mr Brain Wo Contrast  Result Date: 05/13/2017 CLINICAL DATA:  LEFT lower extremity weakness transient, and resolved. Multiple comorbidities, which include diabetes, atrial fibrillation, and hypertension. Also history of ETOH and cocaine abuse. EXAM: MRI HEAD WITHOUT CONTRAST TECHNIQUE: Multiplanar, multiecho pulse sequences of the brain and surrounding structures were obtained without intravenous contrast. COMPARISON:  CT head 05/12/2017. MR head 02/20/2012. FINDINGS: Brain: 3 mm focus of restricted diffusion, corresponding low ADC, LEFT prefrontal cortex, consistent with a small acute infarct. Similar 2 mm focus of restricted diffusion, RIGHT prefrontal cortex, no hemorrhage, mass lesion, hydrocephalus, or extra-axial fluid. Premature for age atrophy. T2 and FLAIR hyperintensities scattered throughout the white matter, predominantly subcortical, likely small vessel disease. Sequelae of old cerebellar and brainstem infarcts, encephalomalacia. Prominent perivascular spaces likely reflect chronic hypertension Vascular: Flow voids are maintained throughout the carotid, basilar, and vertebral arteries. There are no areas of chronic hemorrhage. Skull and upper cervical spine: Normal marrow signal. Sinuses/Orbits: No significant opacity or layering fluid. Negative orbits. Other: RIGHT frontal/supraorbital soft tissue swelling. IMPRESSION: 3 mm area of acute nonhemorrhagic infarction, LEFT prefrontal cortex. Similar 2 mm area of acute nonhemorrhagic infarction, RIGHT prefrontal cortex. Shower of emboli is suspected. Premature for age atrophy with probable small vessel disease. Prior old infarcts. Electronically Signed   By: Staci Righter M.D.   On: 05/13/2017 18:03   Dg Chest Port 1 View  Result Date: 05/13/2017 CLINICAL  DATA:  Shortness of Breath EXAM: PORTABLE CHEST 1 VIEW COMPARISON:  05/12/2017 FINDINGS: Cardiomegaly. Low lung volumes with bibasilar atelectasis. No overt edema or effusions. No acute bony abnormality. IMPRESSION: Cardiomegaly.  Low volumes with bibasilar atelectasis. Electronically Signed   By: Rolm Baptise M.D.   On: 05/13/2017 09:26    (Echo, Carotid, EGD, Colonoscopy, ERCP)    Subjective:  Patient sitting on the emerge from this morning.  Remains comfortable.   Hemodynamically stable.  Discharge planning discussed with the patient.   Discharge Exam: Vitals:   05/14/17 1108 05/14/17 1200  BP: 127/63 (!) 144/108  Pulse: 89 78  Resp: (!) 21 16  Temp:    SpO2: 90% 94%   Vitals:   05/14/17 0955 05/14/17 1000 05/14/17 1108 05/14/17 1200  BP: (!) 175/112 (!) 183/135 127/63 (!) 144/108  Pulse: 99 96 89 78  Resp: (!) 26 16 (!) 21 16  Temp:      TempSrc:      SpO2: 92% 90% 90% 94%  Weight:      Height:        General: Pt is alert, awake, not in acute distress Cardiovascular: RRR, S1/S2 +,  no rubs, no gallops Respiratory: CTA bilaterally, no wheezing, no rhonchi Abdominal: Soft, NT, ND, bowel sounds + Extremities: no edema, no cyanosis    The results of significant diagnostics from this hospitalization (including imaging, microbiology, ancillary and laboratory) are listed below for reference.     Microbiology: Recent Results (from the past 240 hour(s))  MRSA PCR Screening     Status: None   Collection Time: 05/12/17 11:23 PM  Result Value Ref Range Status   MRSA by PCR NEGATIVE NEGATIVE Final    Comment:        The GeneXpert MRSA Assay (FDA approved for NASAL specimens only), is one component of a comprehensive MRSA colonization surveillance program. It is not intended to diagnose MRSA infection nor to guide or monitor treatment for MRSA infections. Performed at [facility], [place] [address], [place], [place] [address]      Labs: BNP (last  3 results) Recent Labs    10/01/16 1850 11/26/16 1740 05/13/17 0317  BNP 119.8* 20.7 84.6   Basic Metabolic Panel: Recent Labs  Lab 05/12/17 1547 05/12/17 1939 05/12/17 2351 05/13/17 0317 05/14/17 0345  NA 120* 131* 137 136 134*  K 5.3* 4.0 3.8 3.3* 3.9  CL 86* 96* 103 102 108  CO2 18* _0 20*  GLUCOSE 1,021* 610* 191* 133* 269*  BUN 25* 22* 22* 22* 12  CREATININE 1.41* 1.41* 1.11 0.90 0.83  CALCIUM 10.7* 9.8 10.4* 10.1 9.1  MG  --  2.2  --  2.0 1.8  PHOS  --  2.4*  --  3.2  --    Liver Function Tests: Recent Labs  Lab 05/12/17 1939 05/13/17 0317 05/14/17 0345  AST _1 ALT _2 ALKPHOS 104 98 86  BILITOT 1.4* 1.0 0.9  PROT 7.4 7.4 6.6  ALBUMIN 3.9 3.7 3.4*   No results for input(s): LIPASE, AMYLASE in the last 168 hours. No results for input(s): AMMONIA in the last 168 hours. CBC: Recent Labs  Lab 05/12/17 1547 05/13/17 0317 05/14/17 0345  WBC 4.7 6.1 5.5  HGB 18.0* 15.8 14.5  HCT 50.8 45.2 43.7  MCV 82.9 85.1 88.1  PLT 193 182 160   Cardiac Enzymes: Recent Labs  Lab 05/12/17 1939 05/13/17 0140 05/13/17 0805 05/13/17 1431 05/13/17 1958  TROPONINI 0.10* 0.10* 0.09* 0.06* 0.07*   BNP: Invalid input(s): POCBNP CBG: Recent Labs  Lab 05/13/17 1638 05/13/17 1810 05/13/17 2102 05/14/17 0725 05/14/17 1149  GLUCAP 166* 139* 270* 307* 341*   D-Dimer No results for input(s): DDIMER in the last 72 hours. Hgb A1c Recent Labs    05/13/17 1958  HGBA1C 14.7*   Lipid Profile Recent Labs    05/14/17 0345  CHOL 243*  HDL 29*  LDLCALC UNABLE TO CALCULATE IF TRIGLYCERIDE OVER 400 mg/dL  TRIG 454*  CHOLHDL 8.4   Thyroid function studies Recent Labs    05/13/17 0317  TSH 2.962   Anemia work up No results for input(s): VITAMINB12, FOLATE, FERRITIN, TIBC, IRON, RETICCTPCT in the last 72 hours. Urinalysis    Component Value Date/Time   COLORURINE STRAW (A) 05/12/2017 1658   APPEARANCEUR CLEAR 05/12/2017 1658   LABSPEC  1.015 05/12/2017 1658   PHURINE 5.0 05/12/2017 1658   GLUCOSEU >=500 (A) 05/12/2017 1658   HGBUR SMALL (A) 05/12/2017 1658   BILIRUBINUR NEGATIVE 05/12/2017 1658   KETONESUR NEGATIVE 05/12/2017 1658   PROTEINUR NEGATIVE 05/12/2017 1658   UROBILINOGEN 1.0 05/17/2014 1925   NITRITE NEGATIVE  05/12/2017 1658   LEUKOCYTESUR NEGATIVE 05/12/2017 1658   Sepsis Labs Invalid input(s): PROCALCITONIN,  WBC,  LACTICIDVEN Microbiology Recent Results (from the past 240 hour(s))  MRSA PCR Screening     Status: None   Collection Time: 05/12/17 11:23 PM  Result Value Ref Range Status   MRSA by PCR NEGATIVE NEGATIVE Final    Comment:        The GeneXpert MRSA Assay (FDA approved for NASAL specimens only), is one component of a comprehensive MRSA colonization surveillance program. It is not intended to diagnose MRSA infection nor to guide or monitor treatment for MRSA infections. Performed at [facility], [place] [address], [place], [place] [address]      Time coordinating discharge: 35 minutes  SIGNED:   Shelly Coss, MD  [facility] 05/14/2017, 1:16 PM Pager [phone]  If 7PM-7AM, please contact night-coverage [email or web address] Password [email or web address]

## 2017-05-14 NOTE — Progress Notes (Addendum)
Inpatient Diabetes Program Recommendations  AACE/ADA: New Consensus Statement on Inpatient Glycemic Control (2015)  Target Ranges:  Prepandial:   less than 140 mg/dL      Peak postprandial:   less than 180 mg/dL (1-2 hours)      Critically ill patients:  140 - 180 mg/dL   Lab Results  Component Value Date   GLUCAP 341 (H) 05/14/2017   HGBA1C 14.7 (H) 05/13/2017    Review of Glycemic Control  Spoke with pt at length regarding his diabetes control. Pt states he's been "lazy and sometimes just doesn't take his insulin." Discussed importance of checking his blood sugars and taking insulin as prescribed to prevent complications. States he has new doctor because "he was kicked out of [facility] for not showing up at scheduled appts." Pt said he has no problems with getting his Lantus, but Humalog is just too expensive for him. Has not taken any rapid-acting insulin for past couple of weeks as he ran out. Discussed hypoglycemia s/s and treatment. States he never has low blood sugars at home. Discussed HgbA1C of 14.7%. Needs to take insulin on regular basis as prescribed. Pt voices understanding.  Inpatient Diabetes Program Recommendations:     Continue with Lantus 50 units QHS  Prescription for ReliOn Regular insulin 15 units tidwc ($25.00 at [facility]) to replace Humalog. ? Whether his insurance would cover Novolog instead of humalog?  RN to instruct pt to take Regular insulin 30 min prior to meal. To f/u with PCP within week of discharge.   Thank you. Lorenda Peck, RD, LDN, CDE Inpatient Diabetes Coordinator [phone]

## 2017-05-14 NOTE — Evaluation (Signed)
Occupational Therapy Evaluation Patient Details Name: [name] [name] MRN: [id number] DOB: Jun 20, 1955 Today's Date: 05/14/2017    History of Present Illness [name] [name] is a 62 y.o. male with medical history significant of DM2, CHF , A.fib, schizophrenia, CKD stage III, HTN, HLD, COPD presumed, hx of EtOH and cocaine abuse who presented with LLE weakness and hypergylcemia in the setting of running out of his mealtime insulin.  He complained of some left leg weakness which has since resolved, however due to this he received an MRI of his brain which does demonstrate 2 small foci of acute ischemia.     Clinical Impression   Pt was admitted for the above.  He has assistance for adls at baseline, 3 hours a day, his brother lives with him but works and he is alone during the day at a mod I level. Will follow in acute for activity tolerance and to further assess for 3:1 commode. Pt states that getting up from his regular commode is sometimes difficult. He used to have DME but no longer has this.    Follow Up Recommendations  No OT follow up;Supervision - Intermittent    Equipment Recommendations  (? wide 3:1; will further assess)    Recommendations for Other Services       Precautions / Restrictions Precautions Precautions: Fall Restrictions Weight Bearing Restrictions: No      Mobility Bed Mobility               General bed mobility comments: in recliner  Transfers Overall transfer level: Needs assistance Equipment used: None Transfers: Sit to/from Stand Sit to Stand: Supervision              Balance Overall balance assessment: Independent                                         ADL either performed or assessed with clinical judgement   ADL Overall ADL's : Needs assistance/impaired                                       General ADL Comments: pt needs min A for LB adls and set up for UB adls. He has a PCA who comes 7x week  and assists with adls.  Pt makes himself eggs; otherwise he has assistance with meals.       Vision Patient Visual Report: No change from baseline       Perception     Praxis      Pertinent Vitals/Pain Pain Assessment: No/denies pain     Hand Dominance Right   Extremity/Trunk Assessment Upper Extremity Assessment Upper Extremity Assessment: Overall WFL for tasks assessed   Lower Extremity Assessment Lower Extremity Assessment: Overall WFL for tasks assessed   Cervical / Trunk Assessment Cervical / Trunk Assessment: Normal   Communication Communication Communication: No difficulties   Cognition Arousal/Alertness: Awake/alert Behavior During Therapy: WFL for tasks assessed/performed Overall Cognitive Status: Within Functional Limits for tasks assessed                                     General Comments  pt's HR up to 130; encouraged standing rest break    Exercises     Shoulder Instructions  Home Living Family/patient expects to be discharged to:: Private residence Living Arrangements: Other relatives Available Help at Discharge: Family;Personal care attendant;Available PRN/intermittently Type of Home: Apartment Home Access: Level entry     Home Layout: One level     Bathroom Shower/Tub: Teacher, early years/pre: Standard     Home Equipment: Cane - single point;Grab bars - tub/shower;Shower seat          Prior Functioning/Environment Level of Independence: Independent with assistive device(s)        Comments: sometimes uses cane        OT Problem List: Decreased strength;Cardiopulmonary status limiting activity      OT Treatment/Interventions: Self-care/ADL training;Energy conservation;DME and/or AE instruction;Patient/family education    OT Goals(Current goals can be found in the care plan section) Acute Rehab OT Goals Patient Stated Goal: to go home OT Goal Formulation: With patient Time For Goal  Achievement: 05/28/17 Potential to Achieve Goals: Good ADL Goals Pt Will Transfer to Toilet: with modified independence;bedside commode;regular height toilet;ambulating(vs)  OT Frequency: Min 2X/week   Barriers to D/C:            Co-evaluation   Reason for Co-Treatment: For patient/therapist safety PT goals addressed during session: Mobility/safety with mobility OT goals addressed during session: ADL's and self-care      AM-PAC PT "6 Clicks" Daily Activity     Outcome Measure Help from another person eating meals?: None Help from another person taking care of personal grooming?: A Little Help from another person toileting, which includes using toliet, bedpan, or urinal?: A Little Help from another person bathing (including washing, rinsing, drying)?: A Little Help from another person to put on and taking off regular upper body clothing?: A Little Help from another person to put on and taking off regular lower body clothing?: A Little 6 Click Score: 19   End of Session    Activity Tolerance: Patient tolerated treatment well Patient left: in chair;with call bell/phone within reach;with chair alarm set  OT Visit Diagnosis: Muscle weakness (generalized) (M62.81)                Time: 1007-1219 OT Time Calculation (min): 25 min Charges:  OT General Charges $OT Visit: 1 Visit OT Evaluation $OT Eval Low Complexity: 1 Low G-Codes:     [name] [name], OTR/L [phone] 05/14/2017  [name] [name] 05/14/2017, 9:45 AM

## 2017-05-14 NOTE — Progress Notes (Signed)
Inpatient Diabetes Program Recommendations  AACE/ADA: New Consensus Statement on Inpatient Glycemic Control (2015)  Target Ranges:  Prepandial:   less than 140 mg/dL      Peak postprandial:   less than 180 mg/dL (1-2 hours)      Critically ill patients:  140 - 180 mg/dL   Lab Results  Component Value Date   GLUCAP 307 (H) 05/14/2017   HGBA1C 14.7 (H) 05/13/2017    Review of Glycemic Control  Blood sugars > goal of 140-180 mg/dL.  Inpatient Diabetes Program Recommendations:     Increase Lantus to 50 units Q24H Increase Novolog to 0-20 units Q4H while NPO  Will talk with pt today regarding HgbA1C of 14.7% and obtaining prescriptions for insulin at discharge.  Thank you. Lorenda Peck, RD, LDN, CDE Inpatient Diabetes Coordinator [phone]

## 2017-05-14 NOTE — Progress Notes (Signed)
Discharge instructions and prescriptions provided to patient, IV removed, all questions answered. Patient stable and wheeled to exit by NT at 1640.

## 2017-05-14 NOTE — Progress Notes (Signed)
SATURATION QUALIFICATIONS: (This note is used to comply with regulatory documentation for home oxygen)  Patient Saturations on Room Air at Rest = 92%  Patient Saturations on Room Air while Ambulating = 100%  Patient Saturations on 0 Liters of oxygen while Ambulating = 100%  Please briefly explain why patient needs home oxygen: Patient is not in need of home oxygen. Deep breaths and ambulation actually improved his O2 delivery. RN able to wean patient off. Resting comfortably on room air at 95% at this time.

## 2017-05-18 LAB — CULTURE, BLOOD (ROUTINE X 2)
CULTURE: NO GROWTH
CULTURE: NO GROWTH
Special Requests: ADEQUATE
Special Requests: ADEQUATE

## 2017-05-27 ENCOUNTER — Emergency Department (HOSPITAL_COMMUNITY): Payer: Medicare Other

## 2017-05-27 ENCOUNTER — Encounter (HOSPITAL_COMMUNITY): Payer: Self-pay

## 2017-05-27 ENCOUNTER — Inpatient Hospital Stay (HOSPITAL_COMMUNITY)
Admission: EM | Admit: 2017-05-27 | Discharge: 2017-06-03 | DRG: 637 | Disposition: A | Discharge status: Home / Self Care | Payer: Medicare Other | Attending: Family Medicine | Admitting: Family Medicine

## 2017-05-27 DIAGNOSIS — I950 Idiopathic hypotension: Secondary | ICD-10-CM

## 2017-05-27 DIAGNOSIS — I480 Paroxysmal atrial fibrillation: Secondary | ICD-10-CM | POA: Diagnosis not present

## 2017-05-27 DIAGNOSIS — F329 Major depressive disorder, single episode, unspecified: Secondary | ICD-10-CM | POA: Diagnosis present

## 2017-05-27 DIAGNOSIS — Z9119 Patient's noncompliance with other medical treatment and regimen: Secondary | ICD-10-CM

## 2017-05-27 DIAGNOSIS — N179 Acute kidney failure, unspecified: Secondary | ICD-10-CM | POA: Diagnosis not present

## 2017-05-27 DIAGNOSIS — Z794 Long term (current) use of insulin: Secondary | ICD-10-CM

## 2017-05-27 DIAGNOSIS — I5042 Chronic combined systolic (congestive) and diastolic (congestive) heart failure: Secondary | ICD-10-CM | POA: Diagnosis not present

## 2017-05-27 DIAGNOSIS — G92 Toxic encephalopathy: Secondary | ICD-10-CM | POA: Diagnosis not present

## 2017-05-27 DIAGNOSIS — Z8774 Personal history of (corrected) congenital malformations of heart and circulatory system: Secondary | ICD-10-CM

## 2017-05-27 DIAGNOSIS — N183 Chronic kidney disease, stage 3 (moderate): Secondary | ICD-10-CM | POA: Diagnosis not present

## 2017-05-27 DIAGNOSIS — R748 Abnormal levels of other serum enzymes: Secondary | ICD-10-CM | POA: Diagnosis not present

## 2017-05-27 DIAGNOSIS — Z8619 Personal history of other infectious and parasitic diseases: Secondary | ICD-10-CM

## 2017-05-27 DIAGNOSIS — R739 Hyperglycemia, unspecified: Secondary | ICD-10-CM

## 2017-05-27 DIAGNOSIS — Z9114 Patient's other noncompliance with medication regimen: Secondary | ICD-10-CM

## 2017-05-27 DIAGNOSIS — M549 Dorsalgia, unspecified: Secondary | ICD-10-CM | POA: Diagnosis present

## 2017-05-27 DIAGNOSIS — G8191 Hemiplegia, unspecified affecting right dominant side: Secondary | ICD-10-CM | POA: Diagnosis present

## 2017-05-27 DIAGNOSIS — R778 Other specified abnormalities of plasma proteins: Secondary | ICD-10-CM | POA: Diagnosis present

## 2017-05-27 DIAGNOSIS — M48061 Spinal stenosis, lumbar region without neurogenic claudication: Secondary | ICD-10-CM | POA: Diagnosis present

## 2017-05-27 DIAGNOSIS — I429 Cardiomyopathy, unspecified: Secondary | ICD-10-CM | POA: Diagnosis present

## 2017-05-27 DIAGNOSIS — R402413 Glasgow coma scale score 13-15, at hospital admission: Secondary | ICD-10-CM | POA: Diagnosis present

## 2017-05-27 DIAGNOSIS — I493 Ventricular premature depolarization: Secondary | ICD-10-CM | POA: Diagnosis present

## 2017-05-27 DIAGNOSIS — E669 Obesity, unspecified: Secondary | ICD-10-CM | POA: Diagnosis present

## 2017-05-27 DIAGNOSIS — Z8659 Personal history of other mental and behavioral disorders: Secondary | ICD-10-CM

## 2017-05-27 DIAGNOSIS — E1010 Type 1 diabetes mellitus with ketoacidosis without coma: Secondary | ICD-10-CM

## 2017-05-27 DIAGNOSIS — E1122 Type 2 diabetes mellitus with diabetic chronic kidney disease: Secondary | ICD-10-CM | POA: Diagnosis present

## 2017-05-27 DIAGNOSIS — G8929 Other chronic pain: Secondary | ICD-10-CM | POA: Diagnosis present

## 2017-05-27 DIAGNOSIS — I440 Atrioventricular block, first degree: Secondary | ICD-10-CM | POA: Diagnosis present

## 2017-05-27 DIAGNOSIS — R008 Other abnormalities of heart beat: Secondary | ICD-10-CM | POA: Diagnosis not present

## 2017-05-27 DIAGNOSIS — F1010 Alcohol abuse, uncomplicated: Secondary | ICD-10-CM | POA: Diagnosis not present

## 2017-05-27 DIAGNOSIS — E785 Hyperlipidemia, unspecified: Secondary | ICD-10-CM | POA: Diagnosis present

## 2017-05-27 DIAGNOSIS — I249 Acute ischemic heart disease, unspecified: Secondary | ICD-10-CM

## 2017-05-27 DIAGNOSIS — I959 Hypotension, unspecified: Secondary | ICD-10-CM | POA: Diagnosis not present

## 2017-05-27 DIAGNOSIS — K5900 Constipation, unspecified: Secondary | ICD-10-CM | POA: Diagnosis present

## 2017-05-27 DIAGNOSIS — Z8673 Personal history of transient ischemic attack (TIA), and cerebral infarction without residual deficits: Secondary | ICD-10-CM

## 2017-05-27 DIAGNOSIS — I159 Secondary hypertension, unspecified: Secondary | ICD-10-CM | POA: Diagnosis not present

## 2017-05-27 DIAGNOSIS — I130 Hypertensive heart and chronic kidney disease with heart failure and stage 1 through stage 4 chronic kidney disease, or unspecified chronic kidney disease: Secondary | ICD-10-CM | POA: Diagnosis present

## 2017-05-27 DIAGNOSIS — Z6839 Body mass index (BMI) 39.0-39.9, adult: Secondary | ICD-10-CM

## 2017-05-27 DIAGNOSIS — Z7901 Long term (current) use of anticoagulants: Secondary | ICD-10-CM

## 2017-05-27 DIAGNOSIS — Z79899 Other long term (current) drug therapy: Secondary | ICD-10-CM

## 2017-05-27 DIAGNOSIS — F209 Schizophrenia, unspecified: Secondary | ICD-10-CM | POA: Diagnosis present

## 2017-05-27 DIAGNOSIS — Z888 Allergy status to other drugs, medicaments and biological substances status: Secondary | ICD-10-CM

## 2017-05-27 DIAGNOSIS — R7989 Other specified abnormal findings of blood chemistry: Secondary | ICD-10-CM | POA: Diagnosis present

## 2017-05-27 DIAGNOSIS — F1910 Other psychoactive substance abuse, uncomplicated: Secondary | ICD-10-CM | POA: Diagnosis present

## 2017-05-27 DIAGNOSIS — E1110 Type 2 diabetes mellitus with ketoacidosis without coma: Principal | ICD-10-CM | POA: Diagnosis present

## 2017-05-27 DIAGNOSIS — I639 Cerebral infarction, unspecified: Secondary | ICD-10-CM | POA: Diagnosis present

## 2017-05-27 DIAGNOSIS — G9340 Encephalopathy, unspecified: Secondary | ICD-10-CM | POA: Insufficient documentation

## 2017-05-27 DIAGNOSIS — Z87891 Personal history of nicotine dependence: Secondary | ICD-10-CM

## 2017-05-27 DIAGNOSIS — G9341 Metabolic encephalopathy: Secondary | ICD-10-CM

## 2017-05-27 DIAGNOSIS — G928 Other toxic encephalopathy: Secondary | ICD-10-CM

## 2017-05-27 DIAGNOSIS — K760 Fatty (change of) liver, not elsewhere classified: Secondary | ICD-10-CM | POA: Diagnosis present

## 2017-05-27 DIAGNOSIS — I10 Essential (primary) hypertension: Secondary | ICD-10-CM | POA: Diagnosis present

## 2017-05-27 DIAGNOSIS — E1136 Type 2 diabetes mellitus with diabetic cataract: Secondary | ICD-10-CM

## 2017-05-27 DIAGNOSIS — N182 Chronic kidney disease, stage 2 (mild): Secondary | ICD-10-CM | POA: Diagnosis present

## 2017-05-27 DIAGNOSIS — R29700 NIHSS score 0: Secondary | ICD-10-CM | POA: Diagnosis present

## 2017-05-27 LAB — DIFFERENTIAL
BASOS PCT: 0 %
Basophils Absolute: 0 10*3/uL (ref 0.0–0.1)
EOS ABS: 0.1 10*3/uL (ref 0.0–0.7)
EOS PCT: 2 %
LYMPHS ABS: 2.5 10*3/uL (ref 0.7–4.0)
Lymphocytes Relative: 40 %
Monocytes Absolute: 0.5 10*3/uL (ref 0.1–1.0)
Monocytes Relative: 8 %
NEUTROS PCT: 50 %
Neutro Abs: 3.2 10*3/uL (ref 1.7–7.7)

## 2017-05-27 LAB — CBC
HCT: 46.2 % (ref 39.0–52.0)
HEMOGLOBIN: 15.5 g/dL (ref 13.0–17.0)
MCH: 30.5 pg (ref 26.0–34.0)
MCHC: 33.5 g/dL (ref 30.0–36.0)
MCV: 90.9 fL (ref 78.0–100.0)
PLATELETS: 175 10*3/uL (ref 150–400)
RBC: 5.08 MIL/uL (ref 4.22–5.81)
RDW: 14.2 % (ref 11.5–15.5)
WBC: 6.3 10*3/uL (ref 4.0–10.5)

## 2017-05-27 LAB — COMPREHENSIVE METABOLIC PANEL
ALBUMIN: 4 g/dL (ref 3.5–5.0)
ALT: 45 U/L (ref 17–63)
ANION GAP: 16 — AB (ref 5–15)
AST: 33 U/L (ref 15–41)
Alkaline Phosphatase: 98 U/L (ref 38–126)
BILIRUBIN TOTAL: 0.9 mg/dL (ref 0.3–1.2)
BUN: 12 mg/dL (ref 6–20)
CHLORIDE: 101 mmol/L (ref 101–111)
CO2: 19 mmol/L — AB (ref 22–32)
Calcium: 9.4 mg/dL (ref 8.9–10.3)
Creatinine, Ser: 1.34 mg/dL — ABNORMAL HIGH (ref 0.61–1.24)
GFR calc Af Amer: >60 mL/min (ref >60)
GFR calc non Af Amer: 55 mL/min — ABNORMAL LOW (ref >60)
GLUCOSE: 456 mg/dL — AB (ref 65–99)
POTASSIUM: 4.4 mmol/L (ref 3.5–5.1)
SODIUM: 136 mmol/L (ref 135–145)
Total Protein: 7 g/dL (ref 6.5–8.1)

## 2017-05-27 LAB — CBG MONITORING, ED
GLUCOSE-CAPILLARY: 408 mg/dL — AB (ref 65–99)
GLUCOSE-CAPILLARY: 289 mg/dL — AB (ref 65–99)
Glucose-Capillary: 473 mg/dL — ABNORMAL HIGH (ref 65–99)
Glucose-Capillary: 404 mg/dL — ABNORMAL HIGH (ref 65–99)

## 2017-05-27 LAB — I-STAT CHEM 8, ED
BUN: 13 mg/dL (ref 6–20)
CALCIUM ION: 1.14 mmol/L — AB (ref 1.15–1.40)
Chloride: 104 mmol/L (ref 101–111)
Creatinine, Ser: 1.3 mg/dL — ABNORMAL HIGH (ref 0.61–1.24)
Glucose, Bld: 472 mg/dL — ABNORMAL HIGH (ref 65–99)
HEMATOCRIT: 48 % (ref 39.0–52.0)
HEMOGLOBIN: 16.3 g/dL (ref 13.0–17.0)
Potassium: 4 mmol/L (ref 3.5–5.1)
SODIUM: 138 mmol/L (ref 135–145)
TCO2: 21 mmol/L — AB (ref 22–32)

## 2017-05-27 LAB — BRAIN NATRIURETIC PEPTIDE: B NATRIURETIC PEPTIDE 5: 67.8 pg/mL (ref 0.0–100.0)

## 2017-05-27 LAB — I-STAT TROPONIN, ED: Troponin i, poc: 0.12 ng/mL (ref 0.00–0.08)

## 2017-05-27 LAB — PROTIME-INR
INR: 1.43
Prothrombin Time: 17.3 seconds — ABNORMAL HIGH (ref 11.4–15.2)

## 2017-05-27 LAB — RAPID URINE DRUG SCREEN, HOSP PERFORMED
AMPHETAMINES: NOT DETECTED
BARBITURATES: NOT DETECTED
Benzodiazepines: NOT DETECTED
COCAINE: POSITIVE — AB
Opiates: POSITIVE — AB
Tetrahydrocannabinol: NOT DETECTED

## 2017-05-27 LAB — APTT: APTT: 32 s (ref 24–36)

## 2017-05-27 LAB — LACTIC ACID, PLASMA: LACTIC ACID, VENOUS: 2.3 mmol/L — AB (ref 0.5–1.9)

## 2017-05-27 LAB — ETHANOL: Alcohol, Ethyl (B): <10 mg/dL (ref <10)

## 2017-05-27 MED ORDER — SACUBITRIL-VALSARTAN 97-103 MG PO TABS
1.0000 | ORAL_TABLET | Freq: Two times a day (BID) | ORAL | Status: DC
  Administered 2017-05-27 – 2017-05-30 (×6): 1 via ORAL
  Filled 2017-05-27 (×8): qty 1

## 2017-05-27 MED ORDER — LORAZEPAM 1 MG PO TABS: 1.0000 mg | ORAL_TABLET | Freq: Four times a day (QID) | ORAL | Status: AC | PRN

## 2017-05-27 MED ORDER — LORAZEPAM 2 MG/ML IJ SOLN
0.0000 mg | Freq: Four times a day (QID) | INTRAMUSCULAR | Status: DC
  Administered 2017-05-27: 1 mg via INTRAVENOUS
  Filled 2017-05-27: qty 1

## 2017-05-27 MED ORDER — INSULIN REGULAR HUMAN 100 UNIT/ML IJ SOLN: INTRAMUSCULAR | Status: DC

## 2017-05-27 MED ORDER — FOLIC ACID 1 MG PO TABS
1.0000 mg | ORAL_TABLET | Freq: Every day | ORAL | Status: DC
  Administered 2017-05-28 – 2017-06-03 (×7): 1 mg via ORAL
  Filled 2017-05-27 (×7): qty 1

## 2017-05-27 MED ORDER — SODIUM CHLORIDE 0.9 % IV BOLUS
1000.0000 mL | Freq: Once | INTRAVENOUS | Status: AC
  Administered 2017-05-27: 1000 mL via INTRAVENOUS

## 2017-05-27 MED ORDER — SODIUM CHLORIDE 0.9 % IV SOLN
INTRAVENOUS | Status: DC
Start: 2017-05-27 — End: 2017-05-27

## 2017-05-27 MED ORDER — CARVEDILOL 6.25 MG PO TABS
6.2500 mg | ORAL_TABLET | Freq: Two times a day (BID) | ORAL | Status: DC
  Administered 2017-05-28 (×2): 6.25 mg via ORAL
  Filled 2017-05-27 (×2): qty 1

## 2017-05-27 MED ORDER — ACETAMINOPHEN 325 MG PO TABS: 650.0000 mg | ORAL_TABLET | Freq: Four times a day (QID) | ORAL | Status: DC | PRN

## 2017-05-27 MED ORDER — SODIUM CHLORIDE 0.9 % IV SOLN
INTRAVENOUS | Status: DC
  Administered 2017-05-27: 3.4 [IU]/h via INTRAVENOUS
  Filled 2017-05-27: qty 1

## 2017-05-27 MED ORDER — ASPIRIN EC 81 MG PO TBEC
81.0000 mg | DELAYED_RELEASE_TABLET | Freq: Every day | ORAL | Status: DC
  Administered 2017-05-27 – 2017-05-31 (×5): 81 mg via ORAL
  Filled 2017-05-27 (×5): qty 1

## 2017-05-27 MED ORDER — ASPIRIN EC 325 MG PO TBEC: 325.0000 mg | DELAYED_RELEASE_TABLET | Freq: Every day | ORAL | Status: DC

## 2017-05-27 MED ORDER — POTASSIUM CHLORIDE 10 MEQ/100ML IV SOLN
10.0000 meq | INTRAVENOUS | Status: AC
  Administered 2017-05-27 – 2017-05-28 (×2): 10 meq via INTRAVENOUS
  Filled 2017-05-27 (×2): qty 100

## 2017-05-27 MED ORDER — SODIUM CHLORIDE 0.9 % IV SOLN
INTRAVENOUS | Status: DC
  Administered 2017-05-27 – 2017-05-28 (×2): via INTRAVENOUS

## 2017-05-27 MED ORDER — VITAMIN B-1 100 MG PO TABS
100.0000 mg | ORAL_TABLET | Freq: Every day | ORAL | Status: DC
  Administered 2017-05-28 – 2017-06-03 (×7): 100 mg via ORAL
  Filled 2017-05-27 (×7): qty 1

## 2017-05-27 MED ORDER — ONDANSETRON HCL 4 MG/2ML IJ SOLN: 4.0000 mg | Freq: Three times a day (TID) | INTRAMUSCULAR | Status: DC | PRN

## 2017-05-27 MED ORDER — LORAZEPAM 2 MG/ML IJ SOLN: 0.0000 mg | Freq: Two times a day (BID) | INTRAMUSCULAR | Status: DC

## 2017-05-27 MED ORDER — HYDRALAZINE HCL 20 MG/ML IJ SOLN: 5.0000 mg | INTRAMUSCULAR | Status: DC | PRN

## 2017-05-27 MED ORDER — THIAMINE HCL 100 MG/ML IJ SOLN: 100.0000 mg | Freq: Every day | INTRAMUSCULAR | Status: DC

## 2017-05-27 MED ORDER — ZOLPIDEM TARTRATE 5 MG PO TABS: 5.0000 mg | ORAL_TABLET | Freq: Every evening | ORAL | Status: DC | PRN

## 2017-05-27 MED ORDER — ADULT MULTIVITAMIN W/MINERALS CH
1.0000 | ORAL_TABLET | Freq: Every day | ORAL | Status: DC
  Administered 2017-05-28 – 2017-06-03 (×7): 1 via ORAL
  Filled 2017-05-27 (×7): qty 1

## 2017-05-27 MED ORDER — DEXTROSE-NACL 5-0.45 % IV SOLN
INTRAVENOUS | Status: DC
  Administered 2017-05-28 (×2): via INTRAVENOUS

## 2017-05-27 MED ORDER — QUETIAPINE FUMARATE 400 MG PO TABS
800.0000 mg | ORAL_TABLET | Freq: Every day | ORAL | Status: DC
Start: 2017-05-27 — End: 2017-06-03
  Administered 2017-05-27 – 2017-05-31 (×5): 800 mg via ORAL
  Administered 2017-06-01 – 2017-06-02 (×2): 600 mg via ORAL
  Filled 2017-05-27 (×8): qty 2

## 2017-05-27 MED ORDER — LORAZEPAM 2 MG/ML IJ SOLN: 1.0000 mg | Freq: Four times a day (QID) | INTRAMUSCULAR | Status: AC | PRN

## 2017-05-27 MED ORDER — NITROGLYCERIN 0.4 MG SL SUBL: 0.4000 mg | SUBLINGUAL_TABLET | SUBLINGUAL | Status: DC | PRN

## 2017-05-27 MED ORDER — SODIUM CHLORIDE 0.9 % IV SOLN
INTRAVENOUS | Status: DC
  Administered 2017-05-27: 22:00:00 via INTRAVENOUS

## 2017-05-27 MED ORDER — DEXTROSE-NACL 5-0.45 % IV SOLN: INTRAVENOUS | Status: DC

## 2017-05-27 MED ORDER — RIVAROXABAN 20 MG PO TABS
20.0000 mg | ORAL_TABLET | Freq: Every day | ORAL | Status: DC
  Administered 2017-05-28 – 2017-06-03 (×7): 20 mg via ORAL
  Filled 2017-05-27 (×7): qty 1

## 2017-05-27 NOTE — H&P (Signed)
History and Physical    [name] [name] YHC:[id number] DOB: 08/31/1955 DOA: 05/27/2017  Referring MD/NP/PA:   PCP: Rogers Blocker, MD   Patient coming from:  The patient is coming from home.  At baseline, pt is independent for most of ADL.  Chief Complaint: AMS and questionable right sided weakness  HPI: [name] [name] is a 62 y.o. male with medical history significant of hypertension, diabetes mellitus, stroke, depression, atrial fibrillation on Xarelto, alcohol abuse, chronic mild elevation of troponin, prostate cancer, CHF with EF of 30%, CKD 3, cocaine abuse, HCV, pancreatitis, H IT, PFO, schizophrenia, who presents with altered mental status, questionable right-sided weakness.  Per report, pt was noted to be confused at about 6:45 PM. Pt was limping to the right sitting in his chair, with questionable right-sided weakness. Fingerstick glucose at that time was higher than the highest limit of the machine. Pt was treated with IVF, and his mental status has gradually improved.  When I saw the patient in the ED, he is oriented x3.  Patient denies unilateral weakness, numbness or tingling his extremities.  No facial droop, slurred speech.  No vision change or hearing loss.  Patient states that he used cocaine yesterday.  Patient denies chest pain, shortness of breath.  He has some mild dry cough, but no fever or chills.  Denies symptoms of UTI.  No nausea, vomiting, diarrhea, abdominal pain.  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  ED Course: pt was found to have DKA with CBG 472, bicarbonate 19 and AG 16, WBC 6.3, INR 1.43, troponin 0 0.12, renal function close to baseline, tachycardia, tachypnea, oxygen saturation 88 to 97% on room air, temperature 98.  CT head is negative for acute intracranial abnormalities. Patient is placed on stepdown bed for observation. Dr. Rory Percy of neuro was consulted.  Review of Systems:  General: no fevers, chills, no body weight  gain, has fatigue HEENT: no blurry vision, hearing changes or sore throat Respiratory: no dyspnea, has coughing, no wheezing CV: no chest pain, no palpitations GI: no nausea, vomiting, abdominal pain, diarrhea, constipation GU: no dysuria, burning on urination, increased urinary frequency, hematuria  Ext: no leg edema Neuro: no unilateral weakness, numbness, or tingling, no vision change or hearing loss. Has confusion. Skin: no rash, no skin tear. MSK: No muscle spasm, no deformity, no limitation of range of movement in spin Heme: No easy bruising.  Travel history: No recent long distant travel.  Allergy:  Allergies  Allergen Reactions  . Heparin Other (See Comments)    Documented HIT under problem list Pt states he's not allergic. " They told me not take it anymore"  . Thorazine [Chlorpromazine Hcl] Other (See Comments)    Body freezes up    Past Medical History:  Diagnosis Date  . Atrial fibrillation ([place])   . Cancer [facility])    Prostate cancer-bx. 3 weeks ago  . Cataract   . Chronic combined systolic and diastolic CHF (congestive heart failure) (HCC)    a) EF 40-45% per 2D echo (02/2012) with grade 1 DD b)  NICM c) RHC (04/2012): RA: 4, RV 45/3/4, PA 42/9 (24), PCWP 14, Fick CO/CI: 5.2 /2.2, PVR 1.9 WU, PA 62% and 64% d) ECHO (10/2012) EF 40-45%, grade II DD, RV nl  . CKD (chronic kidney disease) stage 2, GFR 60-89 ml/min    BL SCr approximately 1-1.3  . Colitis 05/2009   History of colitis of ascending colon noted  on CT abd/pelvis (05/2009), with interval resolution with subsequent CT  . Continuous chronic alcoholism ([place])   . Degenerative lumbar spinal stenosis    s/p L2-3, L3-4, L4-5 laminectomy partial facetectomy, and bilateral foraminotomy  . Diabetes mellitus without complication ([place])    Type II  . Dysrhythmia    A. Fib  . Family history of adverse reaction to anesthesia    "sister, can't go to sleep"  . GERD (gastroesophageal reflux disease)   . H/O cocaine abuse    . Hepatic steatosis    suspected 2/2 alcohol abuse  . Hepatitis C   . History of pancreatitis 01/2011   Admission for acute pancreatitis presumed 2/2 ongoing alcohol abuse- and hypertriglyceridemia  . History of pneumonia   . HIT (heparin-induced thrombocytopenia) ([place])   . Hyperlipidemia   . Hypertension    uncontrolled with medication noncompliance  . Hypertriglyceridemia   . NICM (nonischemic cardiomyopathy) ([place])    a. LHC (04/2012): nl arteries  . PFO (patent foramen ovale) 01/2012   with right to left shunt, noted per TEE in evaluation for source of embolic stroke in 09/3788  . Rhabdomyolysis 02/22/2012   H/O rhabdomyolysis in 01/2012 that was idiopathic, cause never identified  . Schizophrenia, schizo-affective ([place])   . Shortness of breath dyspnea   . Splenic cyst   . Stroke ([place]) 01/2012   Small cerebellar infarcts right greater than left as well as questionable acute left external capsule and caudate nuclear punctate lacunar infarcts noted per MRI (01/2012) - presumed to be embolic likely source PFO with right to left shunt (noted per TEE 01/ 2014)    Past Surgical History:  Procedure Laterality Date  . ACHILLES TENDON REPAIR Right 2007   "it was torn"  . BACK SURGERY    . CARDIAC CATHETERIZATION N/A 09/02/2015   Procedure: Right Heart Cath;  Surgeon: Jolaine Artist, MD;  Location: [facility];  Service: Cardiovascular;  Laterality: N/A;  . CATARACT EXTRACTION W/ INTRAOCULAR LENS IMPLANT Right   . ESOPHAGOGASTRODUODENOSCOPY N/A 06/25/2012   Procedure: ESOPHAGOGASTRODUODENOSCOPY (EGD);  Surgeon: Milus Banister, MD;  Location: [facility];  Service: Endoscopy;  Laterality: N/A;  . I&D EXTREMITY  03/20/2011   Procedure: IRRIGATION AND DEBRIDEMENT EXTREMITY;  Surgeon: Kerin Salen, MD;  Location: [facility];  Service: Orthopedics;  Laterality: Left;  I&D LEFT ACHILLIES TENDON  . KNEE ARTHROSCOPY Left   . LUMBAR LAMINECTOMY     L2-3, L3-4, L4-5 laminectomy, partial  facetectomy  . RESECTION DISTAL CLAVICAL  09/17/2011   Procedure: RESECTION DISTAL CLAVICAL;  Surgeon: Nita Sells, MD;  Location: [facility];  Service: Orthopedics;  Laterality: Right;  right shoulder arthroscopy with sad and open distal clavicle excision   . ROBOT ASSISTED LAPAROSCOPIC RADICAL PROSTATECTOMY N/A 12/16/2012   Procedure: ROBOTIC ASSISTED LAPAROSCOPIC PROSTATECTOMY ;  Surgeon: Ardis Hughs, MD;  Location: [facility];  Service: Urology;  Laterality: N/A;  . TONSILLECTOMY      Social History:  reports that he quit smoking about 15 years ago. His smoking use included cigarettes. He has a 30.00 pack-year smoking history. He has never used smokeless tobacco. He reports that he drinks alcohol. He reports that he has current or past drug history. Drug: "Crack" cocaine.  Family History:  Family History  Problem Relation Age of Onset  . CAD Mother 2       deceased  . CAD Sister   . CAD Brother 30       died  from MI at age 23yo  . Hypertension Unknown      Prior to Admission medications   Medication Sig Start Date End Date Taking? Authorizing Provider  carvedilol (COREG) 6.25 MG tablet take 1 tablet by mouth twice a day with meals 04/30/16  Yes Bensimhon, Shaune Pascal, MD  Insulin Glargine (LANTUS SOLOSTAR) 100 UNIT/ML Solostar Pen Inject 50 Units into the skin daily at 10 pm. 05/14/17 06/13/17 Yes Adhikari, Tamsen Meek, MD  insulin regular (NOVOLIN R,HUMULIN R) 100 units/mL injection 3 times with meals.Take 30 minutes before meal. Patient taking differently: Inject 18 Units into the skin 3 (three) times daily before meals.  05/14/17 05/14/18 Yes Shelly Coss, MD  QUEtiapine (SEROQUEL) 400 MG tablet Take 800 mg by mouth at bedtime.    Yes [provider]  rivaroxaban (XARELTO) 20 MG TABS tablet Take 1 tablet (20 mg total) by mouth daily. 10/01/16  Yes Alphonzo Grieve, MD  sacubitril-valsartan (ENTRESTO) 97-103 MG Take 1 tablet by mouth 2 (two) times daily.  10/20/16  Yes Arbutus Leas, NP  torsemide (DEMADEX) 20 MG tablet Take 40 mg (2 tabs) in am and 20 mg (1 tab) in pm. Patient taking differently: Take 20-40 mg by mouth 2 (two) times daily. Take 40 mg (2 tabs) in the morning and 20 mg (1 tab) in the evening 10/20/16  Yes Arbutus Leas, NP  ACCU-CHEK FASTCLIX LANCETS MISC Check blood sugar 4 times a day before meals and bedtime 02/16/17   [name], MD  B-D UF III MINI PEN NEEDLES 31G X 5 MM MISC Use four times daily as directed. Dx code E11.00, Z79.4 12/23/16   Aldine Contes, MD  Blood Glucose Monitoring Suppl (ACCU-CHEK GUIDE) w/Device KIT 1 each by Does not apply route 4 (four) times daily. 02/16/17   [name], MD  glucose blood (ACCU-CHEK GUIDE) test strip Check blood sugar 4 times a day before meal and bedtime 02/16/17   [name], MD  metFORMIN (GLUCOPHAGE) 1000 MG tablet Take 1 tablet (1,000 mg total) by mouth 2 (two) times daily with a meal. 12/30/15 05/12/17  Norman Herrlich, MD  rosuvastatin (CRESTOR) 40 MG tablet Take 1 tablet (40 mg total) by mouth daily. 05/15/17   Shelly Coss, MD  spironolactone (ALDACTONE) 25 MG tablet Take 0.5 tablets (12.5 mg total) by mouth daily. 10/20/16 05/12/17  Arbutus Leas, NP    Physical Exam: Vitals:   05/27/17 1934 05/27/17 2015 05/27/17 2030 05/27/17 2045  BP: 118/87 (!) 142/91 123/88 (!) 128/93  Pulse: 85 (!) 113 (!) 112 (!) 111  Resp: 15 (!) 21 (!) 22 (!) 23  Temp: 98 F (36.7 C)     TempSrc: Oral     SpO2: 100% 97% 97% 97%   General: Not in acute distress.  Dry mucous membrane HEENT:       Eyes: PERRL, EOMI, no scleral icterus.       ENT: No discharge from the ears and nose, no pharynx injection, no tonsillar enlargement.        Neck: No JVD, no bruit, no mass felt. Heme: No neck lymph node enlargement. Cardiac: S1/S2, RRR, No murmurs, No gallops or rubs. Respiratory: No rales, wheezing, rhonchi or rubs. GI: Soft, nondistended, nontender, no rebound pain, no organomegaly,  BS present. GU: No hematuria Ext: No pitting leg edema bilaterally. 2+DP/PT pulse bilaterally. Musculoskeletal: No joint deformities, No joint redness or warmth, no limitation of ROM in spin. Skin: No rashes.  Neuro: Alert, oriented X3, cranial nerves II-XII  grossly intact, moves all extremities normally. Psych: Patient is not psychotic, no suicidal or hemocidal ideation.  Labs on Admission: I have personally reviewed following labs and imaging studies  CBC: Recent Labs  Lab 05/27/17 1910 05/27/17 1918  WBC 6.3  --   NEUTROABS 3.2  --   HGB 15.5 16.3  HCT 46.2 48.0  MCV 90.9  --   PLT 175  --    Basic Metabolic Panel: Recent Labs  Lab 05/27/17 1910 05/27/17 1918  NA 136 138  K 4.4 4.0  CL 101 104  CO2 19*  --   GLUCOSE 456* 472*  BUN 12 13  CREATININE 1.34* 1.30*  CALCIUM 9.4  --    GFR: CrCl cannot be calculated (Unknown ideal weight.). Liver Function Tests: Recent Labs  Lab 05/27/17 1910  AST 33  ALT 45  ALKPHOS 98  BILITOT 0.9  PROT 7.0  ALBUMIN 4.0   No results for input(s): LIPASE, AMYLASE in the last 168 hours. No results for input(s): AMMONIA in the last 168 hours. Coagulation Profile: Recent Labs  Lab 05/27/17 1910  INR 1.43   Cardiac Enzymes: No results for input(s): CKTOTAL, CKMB, CKMBINDEX, TROPONINI in the last 168 hours. BNP (last 3 results) No results for input(s): PROBNP in the last 8760 hours. HbA1C: No results for input(s): HGBA1C in the last 72 hours. CBG: Recent Labs  Lab 05/27/17 1911 05/27/17 1923 05/27/17 2149  GLUCAP 408* 473* 404*   Lipid Profile: No results for input(s): CHOL, HDL, LDLCALC, TRIG, CHOLHDL, LDLDIRECT in the last 72 hours. Thyroid Function Tests: No results for input(s): TSH, T4TOTAL, FREET4, T3FREE, THYROIDAB in the last 72 hours. Anemia Panel: No results for input(s): VITAMINB12, FOLATE, FERRITIN, TIBC, IRON, RETICCTPCT in the last 72 hours. Urine analysis:    Component Value Date/Time    COLORURINE STRAW (A) 05/12/2017 1658   APPEARANCEUR CLEAR 05/12/2017 1658   LABSPEC 1.015 05/12/2017 1658   PHURINE 5.0 05/12/2017 1658   GLUCOSEU >=500 (A) 05/12/2017 1658   HGBUR SMALL (A) 05/12/2017 1658   BILIRUBINUR NEGATIVE 05/12/2017 1658   KETONESUR NEGATIVE 05/12/2017 1658   PROTEINUR NEGATIVE 05/12/2017 1658   UROBILINOGEN 1.0 05/17/2014 1925   NITRITE NEGATIVE 05/12/2017 1658   LEUKOCYTESUR NEGATIVE 05/12/2017 1658   Sepsis Labs: '@LABRCNTIP'$ (procalcitonin:4,lacticidven:4) )No results found for this or any previous visit (from the past 240 hour(s)).   Radiological Exams on Admission: Dg Chest Port 1 View  Result Date: 05/27/2017 CLINICAL DATA:  Altered mental status EXAM: PORTABLE CHEST 1 VIEW COMPARISON:  05/13/2017, 05/12/2017, 11/26/2016 FINDINGS: Mild cardiomegaly with central vascular congestion. No acute opacity or pleural effusion. No pneumothorax. IMPRESSION: Cardiomegaly with mild central congestion. Electronically Signed   By: Donavan Foil M.D.   On: 05/27/2017 21:07   Ct Head Code Stroke Wo Contrast  Result Date: 05/27/2017 CLINICAL DATA:  Code stroke. 62 y/o M; altered level of consciousness, right-sided deficits. EXAM: CT HEAD WITHOUT CONTRAST TECHNIQUE: Contiguous axial images were obtained from the base of the skull through the vertex without intravenous contrast. COMPARISON:  05/12/2017 CT head.  05/13/2017 MRI head. FINDINGS: Brain: No evidence of acute infarction, hemorrhage, hydrocephalus, extra-axial collection or mass lesion/mass effect. Stable chronic microvascular ischemic changes and parenchymal volume loss of the brain. Vascular: Calcific atherosclerosis of carotid siphons. No hyperdense vessel identified. Skull: Normal. Negative for fracture or focal lesion. Sinuses/Orbits: No acute finding. Other: None. ASPECTS [facility] Stroke Program Early CT Score) - Ganglionic level infarction (caudate, lentiform nuclei, internal capsule, insula, M1-M3 cortex): 7 -  Supraganglionic infarction (M4-M6 cortex): 3 Total score (0-10 with 10 being normal): 10 IMPRESSION: 1. No acute intracranial abnormality identified. 2. ASPECTS is 10 3. Stable chronic microvascular ischemic changes and parenchymal volume loss of the brain. These results were communicated to Dr. Rory Percy at 7:40 pmon 5/2/2019by text page via the [facility] messaging system. Electronically Signed   By: Kristine Garbe M.D.   On: 05/27/2017 19:41     EKG: Independently reviewed.  Sinus rhythm, QTC 455, poor R wave progression, anteroseptal infarction pattern   Assessment/Plan Principal Problem:   DKA (diabetic ketoacidoses) (HCC) Active Problems:   HTN (hypertension)   Alcohol abuse   Paroxysmal atrial fibrillation (HCC)   Chronic combined systolic (congestive) and diastolic (congestive) heart failure (HCC)   Elevated troponin   Acute metabolic encephalopathy   CKD (chronic kidney disease), stage III (HCC)   Stroke (HCC)   DKA (diabetic ketoacidoses) ([place]): Patient has elevated anion gap of 16, bicarbonate 19, blood sugar 472, indicating possible mild DKA.  His mental status has improved to the baseline. No signs of infection.  - place on stepdown for obs - 1L of NS bolus (hx of CHF with EF 30%, limiting aggressive IVF) - start DKA protocol with BMP q4h - IVF: NS 100 cc/h; will switch to D5-1/2NS when CBG<250 - replete K as needed - Zofran prn nausea  - NPO   Acute metabolic encephalopathy: Mental status has improved to the baseline.  Currently oriented x3.  Patient had questionable right-sided weakness.  CT head with no acute intracranial abnormalities.  Neurology was consulted, Dr. Rory Percy did not think this is stroke, and did not recommend further stroke work-up.  Per Dr. Rory Percy it is most likely toxic metabolic encephalopathy.  -Frequent neuro check -Check UDS  HTN:  -Continue home medications: Coreg and entresto -IV hydralazine prn  Atrial Fibrillation: CHA2DS2-VASc Score is  6, needs oral anticoagulation. Patient is on Xarelto at home. INR is 1.43 on admission. Heart rate is 110s -continue Xarelto and coreg   Chronic combined systolic (congestive) and diastolic (congestive) heart failure ([place]): 2D echo on 05/13/2017 showed EF of 30-35% with grade 1 diastolic dysfunction.  Patient does not have leg edema or JVD.  No respiratory distress.  CHF seems to be compensated.  Actually patient is clinically dry. -Hold diuretics - Continue Entresto  Chronically Elevated troponin: Troponin is chronically elevated, recent baseline 0.08-0.10.  His POC trop 0.12 and then trop 0.10. No CP. Still at her baseline.  -trend trop x 3 -EKG in am -on ASA, crestor and coreg  CKD (chronic kidney disease), stage III ([place]): stable. Baseline creatinine 1.0-1.3.  His creatinine is 1.30, BUN 13. -f/u by BMP  Hx of Stroke ([place]): -Crestor ASA and crestor  Alcohol abuse: -CIWA protocol  DVT ppx: on Xarelto Code Status: Full code Family Communication: None at bed side.  Disposition Plan:  Anticipate discharge back to previous home environment Consults called:  Dr. Rory Percy of neuro Admission status:   SDU/obs  Date of Service 05/27/2017    Ivor Costa [facility] Pager [phone]  If 7PM-7AM, please contact night-coverage [email or web address] Password [email or web address] 05/27/2017, 10:30 PM

## 2017-05-27 NOTE — ED Provider Notes (Signed)
[facility] EMERGENCY DEPARTMENT Provider Note   CSN: [id number] Arrival date & time: 05/27/17  1910   An emergency department physician performed an initial assessment on this suspected stroke patient at 27.  History   Chief Complaint Chief Complaint  Patient presents with  . Code Stroke    HPI LEJON AFZAL is a 62 y.o. male.  Patient brought in by EMS altered mental status and right-sided weakness.  In route patient got IV fluids his blood sugars were very high and when he arrived his mental status was back to baseline NIH stroke score was 0.  Seen by hospitalist head CT done without acute findings.  They ruled out any code stroke.  Felt that he had toxic metabolic encephalopathy that resolved and probably hyperglycemia with concerns for DKA.  Patient's past history very significant for atrial fibrillation on anticoagulation with Xarelto hypertension hyperlipidemia diabetes polysubstance abuse to include cocaine ethanol abuse patient was in the hospital a couple weeks ago for generalized weakness was on the [facility] hospitalist service he was discharged also had hyperglycemia at that time and a stroke work-up at that time was negative.  People to called EMS felt that he was not very awake and that he was leaning to his right side.     Past Medical History:  Diagnosis Date  . Atrial fibrillation ([place])   . Cancer [facility])    Prostate cancer-bx. 3 weeks ago  . Cataract   . Chronic combined systolic and diastolic CHF (congestive heart failure) (HCC)    a) EF 40-45% per 2D echo (02/2012) with grade 1 DD b)  NICM c) RHC (04/2012): RA: 4, RV 45/3/4, PA 42/9 (24), PCWP 14, Fick CO/CI: 5.2 /2.2, PVR 1.9 WU, PA 62% and 64% d) ECHO (10/2012) EF 40-45%, grade II DD, RV nl  . CKD (chronic kidney disease) stage 2, GFR 60-89 ml/min    BL SCr approximately 1-1.3  . Colitis 05/2009   History of colitis of ascending colon noted on CT abd/pelvis (05/2009), with interval resolution  with subsequent CT  . Continuous chronic alcoholism ([place])   . Degenerative lumbar spinal stenosis    s/p L2-3, L3-4, L4-5 laminectomy partial facetectomy, and bilateral foraminotomy  . Diabetes mellitus without complication ([place])    Type II  . Dysrhythmia    A. Fib  . Family history of adverse reaction to anesthesia    "sister, can't go to sleep"  . GERD (gastroesophageal reflux disease)   . H/O cocaine abuse   . Hepatic steatosis    suspected 2/2 alcohol abuse  . Hepatitis C   . History of pancreatitis 01/2011   Admission for acute pancreatitis presumed 2/2 ongoing alcohol abuse- and hypertriglyceridemia  . History of pneumonia   . HIT (heparin-induced thrombocytopenia) ([place])   . Hyperlipidemia   . Hypertension    uncontrolled with medication noncompliance  . Hypertriglyceridemia   . NICM (nonischemic cardiomyopathy) ([place])    a. LHC (04/2012): nl arteries  . PFO (patent foramen ovale) 01/2012   with right to left shunt, noted per TEE in evaluation for source of embolic stroke in 02/1222  . Rhabdomyolysis 02/22/2012   H/O rhabdomyolysis in 01/2012 that was idiopathic, cause never identified  . Schizophrenia, schizo-affective ([place])   . Shortness of breath dyspnea   . Splenic cyst   . Stroke ([place]) 01/2012   Small cerebellar infarcts right greater than left as well as questionable acute left external capsule and caudate nuclear punctate lacunar infarcts noted  per MRI (01/2012) - presumed to be embolic likely source PFO with right to left shunt (noted per TEE 01/ 2014)    Patient Active Problem List   Diagnosis Date Noted  . DKA (diabetic ketoacidoses) ([place]) 05/27/2017  . Acute metabolic encephalopathy 35/00/9381  . CKD (chronic kidney disease), stage III ([place]) 05/27/2017  . Stroke ([place]) 05/27/2017  . Toxic metabolic encephalopathy   . Chronic combined systolic (congestive) and diastolic (congestive) heart failure ([place]) 05/12/2017  . Elevated troponin 05/12/2017  . Hyperosmolar  non-ketotic state in patient with type 2 diabetes mellitus ([place]) 05/12/2017  . Dehydration 05/12/2017  . Nonketotic hyperglycinemia, type II ([place]) 05/12/2017  . AKI (acute kidney injury) ([place])   . Acute on chronic respiratory failure with hypoxia ([place])   . Acute on chronic diastolic (congestive) heart failure ([place])   . Chest pain 11/27/2016  . SOB (shortness of breath) 11/26/2016  . Acute exacerbation of CHF (congestive heart failure) ([place]) 10/01/2016  . Acute on chronic systolic CHF (congestive heart failure) ([place]) 10/01/2016  . Diabetes ([place])   . Overgrown toenails 09/10/2015  . Heart failure with reduced ejection fraction, NYHA class III ([place]) 06/07/2015  . Hepatitis C antibody test positive 06/07/2015  . Paroxysmal atrial fibrillation ([place]) 06/07/2015  . Schizophrenia ([place]) 03/06/2014  . Alcohol abuse 09/20/2013  . Generalized weakness 08/04/2013  . Carpal tunnel syndrome 04/11/2013  . Chronic back pain greater than 3 months duration 07/19/2012  . Gastric ulcer with hemorrhage 06/25/2012  . CKD (chronic kidney disease) stage 2, GFR 60-89 ml/min   . CVA (cerebral infarction) 02/29/2012  . HTN (hypertension) 02/20/2012    Past Surgical History:  Procedure Laterality Date  . ACHILLES TENDON REPAIR Right 2007   "it was torn"  . BACK SURGERY    . CARDIAC CATHETERIZATION N/A 09/02/2015   Procedure: Right Heart Cath;  Surgeon: Jolaine Artist, MD;  Location: [facility];  Service: Cardiovascular;  Laterality: N/A;  . CATARACT EXTRACTION W/ INTRAOCULAR LENS IMPLANT Right   . ESOPHAGOGASTRODUODENOSCOPY N/A 06/25/2012   Procedure: ESOPHAGOGASTRODUODENOSCOPY (EGD);  Surgeon: Milus Banister, MD;  Location: [facility];  Service: Endoscopy;  Laterality: N/A;  . I&D EXTREMITY  03/20/2011   Procedure: IRRIGATION AND DEBRIDEMENT EXTREMITY;  Surgeon: Kerin Salen, MD;  Location: [facility];  Service: Orthopedics;  Laterality: Left;  I&D LEFT ACHILLIES TENDON  . KNEE ARTHROSCOPY Left   .  LUMBAR LAMINECTOMY     L2-3, L3-4, L4-5 laminectomy, partial facetectomy  . RESECTION DISTAL CLAVICAL  09/17/2011   Procedure: RESECTION DISTAL CLAVICAL;  Surgeon: Nita Sells, MD;  Location: [facility];  Service: Orthopedics;  Laterality: Right;  right shoulder arthroscopy with sad and open distal clavicle excision   . ROBOT ASSISTED LAPAROSCOPIC RADICAL PROSTATECTOMY N/A 12/16/2012   Procedure: ROBOTIC ASSISTED LAPAROSCOPIC PROSTATECTOMY ;  Surgeon: Ardis Hughs, MD;  Location: [facility];  Service: Urology;  Laterality: N/A;  . TONSILLECTOMY          Home Medications    Prior to Admission medications   Medication Sig Start Date End Date Taking? Authorizing Provider  carvedilol (COREG) 6.25 MG tablet take 1 tablet by mouth twice a day with meals 04/30/16  Yes Bensimhon, Shaune Pascal, MD  Insulin Glargine (LANTUS SOLOSTAR) 100 UNIT/ML Solostar Pen Inject 50 Units into the skin daily at 10 pm. 05/14/17 06/13/17 Yes Adhikari, Tamsen Meek, MD  insulin regular (NOVOLIN R,HUMULIN R) 100 units/mL injection 3 times with meals.Take 30 minutes before meal. Patient  taking differently: Inject 18 Units into the skin 3 (three) times daily before meals.  05/14/17 05/14/18 Yes Shelly Coss, MD  QUEtiapine (SEROQUEL) 400 MG tablet Take 800 mg by mouth at bedtime.    Yes [provider]  rivaroxaban (XARELTO) 20 MG TABS tablet Take 1 tablet (20 mg total) by mouth daily. 10/01/16  Yes Alphonzo Grieve, MD  sacubitril-valsartan (ENTRESTO) 97-103 MG Take 1 tablet by mouth 2 (two) times daily. 10/20/16  Yes Arbutus Leas, NP  torsemide (DEMADEX) 20 MG tablet Take 40 mg (2 tabs) in am and 20 mg (1 tab) in pm. Patient taking differently: Take 20-40 mg by mouth 2 (two) times daily. Take 40 mg (2 tabs) in the morning and 20 mg (1 tab) in the evening 10/20/16  Yes Arbutus Leas, NP  ACCU-CHEK FASTCLIX LANCETS MISC Check blood sugar 4 times a day before meals and bedtime 02/16/17   [name],  MD  B-D UF III MINI PEN NEEDLES 31G X 5 MM MISC Use four times daily as directed. Dx code E11.00, Z79.4 12/23/16   Aldine Contes, MD  Blood Glucose Monitoring Suppl (ACCU-CHEK GUIDE) w/Device KIT 1 each by Does not apply route 4 (four) times daily. 02/16/17   [name], MD  glucose blood (ACCU-CHEK GUIDE) test strip Check blood sugar 4 times a day before meal and bedtime 02/16/17   [name], MD  metFORMIN (GLUCOPHAGE) 1000 MG tablet Take 1 tablet (1,000 mg total) by mouth 2 (two) times daily with a meal. 12/30/15 05/12/17  Norman Herrlich, MD  rosuvastatin (CRESTOR) 40 MG tablet Take 1 tablet (40 mg total) by mouth daily. 05/15/17   Shelly Coss, MD  spironolactone (ALDACTONE) 25 MG tablet Take 0.5 tablets (12.5 mg total) by mouth daily. 10/20/16 05/12/17  Arbutus Leas, NP    Family History Family History  Problem Relation Age of Onset  . CAD Mother 18       deceased  . CAD Sister   . CAD Brother 19       died from MI at age 108yo  . Hypertension Unknown     Social History Social History   Tobacco Use  . Smoking status: Former Smoker    Packs/day: 1.00    Years: 30.00    Pack years: 30.00    Types: Cigarettes    Last attempt to quit: 06/24/2001    Years since quitting: 15.9  . Smokeless tobacco: Never Used  Substance Use Topics  . Alcohol use: Yes    Alcohol/week: 0.0 oz  . Drug use: Yes    Types: "Crack" cocaine    Comment: h/o cocaine abuse; last use about 2 months ago (10/01/16)     Allergies   Heparin and Thorazine [chlorpromazine hcl]   Review of Systems Review of Systems  Constitutional: Negative for fever.  HENT: Negative for congestion.   Eyes: Negative for visual disturbance.  Respiratory: Negative for shortness of breath.   Cardiovascular: Negative for chest pain.  Gastrointestinal: Negative for abdominal pain.  Genitourinary: Negative for dysuria.  Musculoskeletal: Positive for back pain.  Skin: Negative for rash.  Neurological: Negative  for headaches.  Hematological: Bruises/bleeds easily.  Psychiatric/Behavioral: Positive for confusion.     Physical Exam Updated Vital Signs BP (!) 128/93   Pulse (!) 111   Temp 98 F (36.7 C) (Oral)   Resp (!) 23   SpO2 97%   Physical Exam  Constitutional: He is oriented to person, place, and time. He appears  well-developed and well-nourished. No distress.  HENT:  Head: Normocephalic and atraumatic.  Mucous membranes dry  Eyes: Pupils are equal, round, and reactive to light. Conjunctivae and EOM are normal.  Neck: Neck supple.  Cardiovascular: Normal rate, regular rhythm and normal heart sounds.  Pulmonary/Chest: Effort normal and breath sounds normal. No respiratory distress.  Abdominal: Soft. Bowel sounds are normal. There is no tenderness.  Musculoskeletal: Normal range of motion. He exhibits edema.  With chronic skin changes  Neurological: He is alert and oriented to person, place, and time. No cranial nerve deficit. He exhibits normal muscle tone.  Skin: Skin is warm. Rash noted.  Nursing note and vitals reviewed.    ED Treatments / Results  Labs (all labs ordered are listed, but only abnormal results are displayed) Labs Reviewed  PROTIME-INR - Abnormal; Notable for the following components:      Result Value   Prothrombin Time 17.3 (*)    All other components within normal limits  COMPREHENSIVE METABOLIC PANEL - Abnormal; Notable for the following components:   CO2 19 (*)    Glucose, Bld 456 (*)    Creatinine, Ser 1.34 (*)    GFR calc non Af Amer 55 (*)    Anion gap 16 (*)    All other components within normal limits  I-STAT TROPONIN, ED - Abnormal; Notable for the following components:   Troponin i, poc 0.12 (*)    All other components within normal limits  CBG MONITORING, ED - Abnormal; Notable for the following components:   Glucose-Capillary 408 (*)    All other components within normal limits  I-STAT CHEM 8, ED - Abnormal; Notable for the following  components:   Creatinine, Ser 1.30 (*)    Glucose, Bld 472 (*)    Calcium, Ion 1.14 (*)    TCO2 21 (*)    All other components within normal limits  CBG MONITORING, ED - Abnormal; Notable for the following components:   Glucose-Capillary 473 (*)    All other components within normal limits  CBG MONITORING, ED - Abnormal; Notable for the following components:   Glucose-Capillary 404 (*)    All other components within normal limits  APTT  CBC  DIFFERENTIAL  LACTIC ACID, PLASMA  BRAIN NATRIURETIC PEPTIDE  RAPID URINE DRUG SCREEN, HOSP PERFORMED  ETHANOL  TROPONIN I  TROPONIN I  TROPONIN I  BASIC METABOLIC PANEL  BASIC METABOLIC PANEL  BASIC METABOLIC PANEL  BASIC METABOLIC PANEL    EKG EKG Interpretation  Date/Time:  Thursday May 27 2017 19:30:28 EDT Ventricular Rate:  115 PR Interval:    QRS Duration: 108 QT Interval:  329 QTC Calculation: 455 R Axis:   118 Text Interpretation:  Sinus or ectopic atrial tachycardia Multiform ventricular premature complexes Right axis deviation Borderline low voltage, extremity leads Consider anterolateral infarct ST elevation, consider inferior injury Baseline wander in lead(s) II III aVF Confirmed by Fredia Sorrow [phone]) on 05/27/2017 8:35:07 PM   Radiology Dg Chest Port 1 View  Result Date: 05/27/2017 CLINICAL DATA:  Altered mental status EXAM: PORTABLE CHEST 1 VIEW COMPARISON:  05/13/2017, 05/12/2017, 11/26/2016 FINDINGS: Mild cardiomegaly with central vascular congestion. No acute opacity or pleural effusion. No pneumothorax. IMPRESSION: Cardiomegaly with mild central congestion. Electronically Signed   By: Donavan Foil M.D.   On: 05/27/2017 21:07   Ct Head Code Stroke Wo Contrast  Result Date: 05/27/2017 CLINICAL DATA:  Code stroke. 62 y/o M; altered level of consciousness, right-sided deficits. EXAM: CT HEAD WITHOUT CONTRAST TECHNIQUE:  Contiguous axial images were obtained from the base of the skull through the vertex without  intravenous contrast. COMPARISON:  05/12/2017 CT head.  05/13/2017 MRI head. FINDINGS: Brain: No evidence of acute infarction, hemorrhage, hydrocephalus, extra-axial collection or mass lesion/mass effect. Stable chronic microvascular ischemic changes and parenchymal volume loss of the brain. Vascular: Calcific atherosclerosis of carotid siphons. No hyperdense vessel identified. Skull: Normal. Negative for fracture or focal lesion. Sinuses/Orbits: No acute finding. Other: None. ASPECTS [facility] Stroke Program Early CT Score) - Ganglionic level infarction (caudate, lentiform nuclei, internal capsule, insula, M1-M3 cortex): 7 - Supraganglionic infarction (M4-M6 cortex): 3 Total score (0-10 with 10 being normal): 10 IMPRESSION: 1. No acute intracranial abnormality identified. 2. ASPECTS is 10 3. Stable chronic microvascular ischemic changes and parenchymal volume loss of the brain. These results were communicated to Dr. Rory Percy at 7:40 pmon 5/2/2019by text page via the [facility] messaging system. Electronically Signed   By: Kristine Garbe M.D.   On: 05/27/2017 19:41    Procedures Procedures (including critical care time)  CRITICAL CARE Performed by: Fredia Sorrow Total critical care time: 30 minutes Critical care time was exclusive of separately billable procedures and treating other patients. Critical care was necessary to treat or prevent imminent or life-threatening deterioration. Critical care was time spent personally by me on the following activities: development of treatment plan with patient and/or surrogate as well as nursing, discussions with consultants, evaluation of patient's response to treatment, examination of patient, obtaining history from patient or surrogate, ordering and performing treatments and interventions, ordering and review of laboratory studies, ordering and review of radiographic studies, pulse oximetry and re-evaluation of patient's condition.   Medications Ordered in  ED Medications  insulin regular (NOVOLIN R,HUMULIN R) 100 Units in sodium chloride 0.9 % 100 mL (1 Units/mL) infusion (3.4 Units/hr Intravenous New Bag/Given 05/27/17 2153)  0.9 %  sodium chloride infusion (has no administration in time range)  carvedilol (COREG) tablet 6.25 mg (has no administration in time range)  QUEtiapine (SEROQUEL) tablet 800 mg (has no administration in time range)  rivaroxaban (XARELTO) tablet 20 mg (has no administration in time range)  sacubitril-valsartan (ENTRESTO) 97-103 mg per tablet (has no administration in time range)  nitroGLYCERIN (NITROSTAT) SL tablet 0.4 mg (has no administration in time range)  dextrose 5 %-0.45 % sodium chloride infusion (has no administration in time range)  potassium chloride 10 mEq in 100 mL IVPB (has no administration in time range)  LORazepam (ATIVAN) tablet 1 mg (has no administration in time range)    Or  LORazepam (ATIVAN) injection 1 mg (has no administration in time range)  thiamine (VITAMIN B-1) tablet 100 mg (has no administration in time range)    Or  thiamine (B-1) injection 100 mg (has no administration in time range)  folic acid (FOLVITE) tablet 1 mg (has no administration in time range)  multivitamin with minerals tablet 1 tablet (has no administration in time range)  LORazepam (ATIVAN) injection 0-4 mg (has no administration in time range)    Followed by  LORazepam (ATIVAN) injection 0-4 mg (has no administration in time range)  aspirin EC tablet 81 mg (has no administration in time range)  ondansetron (ZOFRAN) injection 4 mg (has no administration in time range)  hydrALAZINE (APRESOLINE) injection 5 mg (has no administration in time range)  zolpidem (AMBIEN) tablet 5 mg (has no administration in time range)  acetaminophen (TYLENOL) tablet 650 mg (has no administration in time range)  sodium chloride 0.9 % bolus 1,000 mL (1,000 mLs  Intravenous New Bag/Given 05/27/17 2150)     Initial Impression / Assessment and Plan  / ED Course  I have reviewed the triage vital signs and the nursing notes.  Pertinent labs & imaging results that were available during my care of the patient were reviewed by me and considered in my medical decision making (see chart for details).     Patient arrived by EMS as a code stroke.  Seen by the neuro hospitalist.  They felt it was not related to code stroke or more toxic metabolic encephalopathy.  Patient has past medical history significant for atrial fib he is on Xarelto.  Hypertension hyperlipidemia diabetes polysubstance abuse alcohol abuse and in place of his abuse includes cocaine abuse.  Patient was seen in the hospital couple weeks ago for generalized weakness and hyperglycemia they did a stroke work-up at that time.  EMS was called friends noted that patient seemed to be leaning to his right sitting in a chair upon EMS arrival he was not able to respond to basic questions and did exhibit some right-sided weakness.  Fingerstick glucose was greater than the machine would not register.  Upon arrival here patient was back to his baseline and his NIH score was 0.  Apparently no reported seizure activity.  Patient states he took his blood thinner today.  Patient denies any chest pain denies any tingling or numbness denies any shortness of breath.  No abdominal pain no nausea no vomiting.   Work-up significant for elevated troponin so he will need an acute cardiac work-up  For the hyperglycemia patient started on glucose stabilizer.  May very well be in diabetic ketoacidosis.  May very well had toxic metabolic encephalopathy patient now is alert without any acute mental status.  Neuro hospitalist is not recommending any further stroke work-up.  Patient will be admitted by the hospitalist.  Final Clinical Impressions(s) / ED Diagnoses   Final diagnoses:  Toxic metabolic encephalopathy  Hyperglycemia  ACS (acute coronary syndrome) [facility])    ED Discharge Orders    None         Fredia Sorrow, MD 05/27/17 2242

## 2017-05-27 NOTE — ED Triage Notes (Signed)
Pt at 1837 family noticed pt was leaning to the right with new onset of AMS. Ems states that pt was weaker on the right side had a TIA about two weeks ago. Pt is axox2. Pt upon arrival was axox4 with no deficits at this time.

## 2017-05-27 NOTE — Consult Note (Addendum)
Neurology Consultation  Reason for Consult: Code stroke Referring Physician: Dr. Bobby Rumpf  CC: Altered mental status, questionable right-sided weakness  History is obtained from: EMS, patient  HPI: [name] [name] is a 62 y.o. male past medical history of atrial fibrillation on anticoagulation with Xarelto, hypertension, hyperlipidemia, diabetes, polysubstance abuse, ethanol abuse, seen in the hospital couple weeks ago for generalized weakness and hyperglycemia and discharged, was in usual state of health about 6:45 PM today when friends noticed that he was limping to the right sitting in his chair.  EMS was called.  Upon EMS assessment, he was not able to respond to basic questions and did exhibit some right-sided weakness.  Fingerstick glucose at that time was higher than the highest limit of the machine.  He got some IV fluids and started to improve in route to the hospital.  He was back to his baseline when he reached the hospital with an NIH of 0. And no reported seizure activity.  He reports compliance to medications.  Reports he took his Xarelto this morning.  His chart does mention noncompliance to medications but he insists that other than his diabetes medications, he has been taking all other medications regularly. Denies any headaches or visual changes.  Denies any tingling or numbness.  Reports generalized lethargy and weakness.  Denies any chest pain currently.  Denies any shortness of breath.  Denies any abdominal pain nausea or vomiting.   LKW: 1845 hrs. on 05/27/2017 tpa given?: no, NIH 0 Premorbid modified Rankin scale (mRS): 0   ROS: ROS was performed and is negative except as noted in the HPI.    Past Medical History:  Diagnosis Date  . Atrial fibrillation ([place])   . Cancer [facility])    Prostate cancer-bx. 3 weeks ago  . Cataract   . Chronic combined systolic and diastolic CHF (congestive heart failure) (HCC)    a) EF 40-45% per 2D echo (02/2012) with grade 1 DD b)  NICM c)  RHC (04/2012): RA: 4, RV 45/3/4, PA 42/9 (24), PCWP 14, Fick CO/CI: 5.2 /2.2, PVR 1.9 WU, PA 62% and 64% d) ECHO (10/2012) EF 40-45%, grade II DD, RV nl  . CKD (chronic kidney disease) stage 2, GFR 60-89 ml/min    BL SCr approximately 1-1.3  . Colitis 05/2009   History of colitis of ascending colon noted on CT abd/pelvis (05/2009), with interval resolution with subsequent CT  . Continuous chronic alcoholism ([place])   . Degenerative lumbar spinal stenosis    s/p L2-3, L3-4, L4-5 laminectomy partial facetectomy, and bilateral foraminotomy  . Diabetes mellitus without complication ([place])    Type II  . Dysrhythmia    A. Fib  . Family history of adverse reaction to anesthesia    "sister, can't go to sleep"  . GERD (gastroesophageal reflux disease)   . H/O cocaine abuse   . Hepatic steatosis    suspected 2/2 alcohol abuse  . Hepatitis C   . History of pancreatitis 01/2011   Admission for acute pancreatitis presumed 2/2 ongoing alcohol abuse- and hypertriglyceridemia  . History of pneumonia   . HIT (heparin-induced thrombocytopenia) ([place])   . Hyperlipidemia   . Hypertension    uncontrolled with medication noncompliance  . Hypertriglyceridemia   . NICM (nonischemic cardiomyopathy) ([place])    a. LHC (04/2012): nl arteries  . PFO (patent foramen ovale) 01/2012   with right to left shunt, noted per TEE in evaluation for source of embolic stroke in 09/9831  . Rhabdomyolysis 02/22/2012  H/O rhabdomyolysis in 01/2012 that was idiopathic, cause never identified  . Schizophrenia, schizo-affective ([place])   . Shortness of breath dyspnea   . Splenic cyst   . Stroke ([place]) 01/2012   Small cerebellar infarcts right greater than left as well as questionable acute left external capsule and caudate nuclear punctate lacunar infarcts noted per MRI (01/2012) - presumed to be embolic likely source PFO with right to left shunt (noted per TEE 01/ 2014)    Family History  Problem Relation Age of Onset  . CAD Mother  36       deceased  . CAD Sister   . CAD Brother 42       died from MI at age 48yo  . Hypertension Unknown    Social History:   reports that he quit smoking about 15 years ago. His smoking use included cigarettes. He has a 30.00 pack-year smoking history. He has never used smokeless tobacco. He reports that he drinks alcohol. He reports that he has current or past drug history. Drug: "Crack" cocaine.  Medications No current facility-administered medications for this encounter.   Current Outpatient Medications:  .  ACCU-CHEK FASTCLIX LANCETS MISC, Check blood sugar 4 times a day before meals and bedtime, Disp: 204 each, Rfl: 5 .  B-D UF III MINI PEN NEEDLES 31G X 5 MM MISC, Use four times daily as directed. Dx code E11.00, Z79.4, Disp: 100 each, Rfl: 3 .  Blood Glucose Monitoring Suppl (ACCU-CHEK GUIDE) w/Device KIT, 1 each by Does not apply route 4 (four) times daily., Disp: 1 kit, Rfl: 1 .  Cariprazine HCl (VRAYLAR) 3 MG CAPS, Take 3 mg by mouth at bedtime., Disp: , Rfl:  .  carvedilol (COREG) 6.25 MG tablet, take 1 tablet by mouth twice a day with meals, Disp: 60 tablet, Rfl: 3 .  glucose blood (ACCU-CHEK GUIDE) test strip, Check blood sugar 4 times a day before meal and bedtime, Disp: 150 each, Rfl: 5 .  Insulin Glargine (LANTUS SOLOSTAR) 100 UNIT/ML Solostar Pen, Inject 50 Units into the skin daily at 10 pm., Disp: 15 mL, Rfl: 0 .  insulin regular (NOVOLIN R,HUMULIN R) 100 units/mL injection, 3 times with meals.Take 30 minutes before meal., Disp: 15 mL, Rfl: 1 .  metFORMIN (GLUCOPHAGE) 1000 MG tablet, Take 1 tablet (1,000 mg total) by mouth 2 (two) times daily with a meal., Disp: 60 tablet, Rfl: 11 .  QUEtiapine (SEROQUEL) 400 MG tablet, Take 800 mg by mouth at bedtime. , Disp: , Rfl:  .  rivaroxaban (XARELTO) 20 MG TABS tablet, Take 1 tablet (20 mg total) by mouth daily., Disp: 90 tablet, Rfl: 3 .  rosuvastatin (CRESTOR) 40 MG tablet, Take 1 tablet (40 mg total) by mouth daily., Disp: 30  tablet, Rfl: 0 .  sacubitril-valsartan (ENTRESTO) 97-103 MG, Take 1 tablet by mouth 2 (two) times daily., Disp: 60 tablet, Rfl: 6 .  spironolactone (ALDACTONE) 25 MG tablet, Take 0.5 tablets (12.5 mg total) by mouth daily., Disp: 45 tablet, Rfl: 3 .  torsemide (DEMADEX) 20 MG tablet, Take 40 mg (2 tabs) in am and 20 mg (1 tab) in pm. (Patient taking differently: Take 20-40 mg by mouth 2 (two) times daily. Take 40 mg (2 tabs) in the morning and 20 mg (1 tab) in the evening), Disp: 90 tablet, Rfl: 6  Exam: Current vital signs: BP 118/87 (BP Location: Right Arm)   Pulse 85   Temp 98 F (36.7 C) (Oral)   Resp 15   SpO2  100%  Vital signs in last 24 hours: Temp:  [98 F (36.7 C)] 98 F (36.7 C) (05/02 1934) Pulse Rate:  [85-112] 85 (05/02 1934) Resp:  [15-23] 15 (05/02 1934) BP: (118-121)/(87-91) 118/87 (05/02 1934) SpO2:  [88 %-100 %] 100 % (05/02 1934)  GENERAL: Awake, alert in NAD HEENT: - Normocephalic and atraumatic, dry mm, no LN++, no Thyromegally LUNGS - Clear to auscultation bilaterally with no wheezes CV - S1S2 RRR, no m/r/g, equal pulses bilaterally. ABDOMEN - Soft, nontender, nondistended with normoactive BS Ext: warm, well perfused, intact peripheral pulses, 1+ edema  NEURO:  Mental Status: AA&Ox3  Language: speech is not dysarthric.  Naming, repetition, fluency, and comprehension intact. Cranial Nerves: PERRL. EOMI, visual fields full, no facial asymmetry,facial sensation intact, hearing intact, tongue/uvula/soft palate midline, normal sternocleidomastoid and trapezius muscle strength. No evidence of tongue atrophy or fibrillations Motor: 5/5 with no drift. Fine tremor in both arms extended. Tone: is normal and bulk is normal Sensation- Intact to light touch bilaterally Coordination: FTN intact bilaterally, no ataxia in BLE. Gait- deferred  NIHSS 0   Labs I have reviewed labs in epic and the results pertinent to this consultation are: Initial fingerstick per EMS to  high, repeat fingerstick 472, creatinine 1.3, unable to calculate LDL due to very high triglycerides from last visit.  CBC    Component Value Date/Time   WBC 6.3 05/27/2017 1910   RBC 5.08 05/27/2017 1910   HGB 16.3 05/27/2017 1918   HCT 48.0 05/27/2017 1918   PLT 175 05/27/2017 1910   MCV 90.9 05/27/2017 1910   MCH 30.5 05/27/2017 1910   MCHC 33.5 05/27/2017 1910   RDW 14.2 05/27/2017 1910   LYMPHSABS 2.5 05/27/2017 1910   MONOABS 0.5 05/27/2017 1910   EOSABS 0.1 05/27/2017 1910   BASOSABS 0.0 05/27/2017 1910   CMP     Component Value Date/Time   NA 138 05/27/2017 1918   NA 140 06/23/2016 1444   K 4.0 05/27/2017 1918   CL 104 05/27/2017 1918   CO2 20 (L) 05/14/2017 0345   GLUCOSE 472 (H) 05/27/2017 1918   BUN 13 05/27/2017 1918   BUN 17 06/23/2016 1444   CREATININE 1.30 (H) 05/27/2017 1918   CREATININE 1.16 07/10/2015 1255   CALCIUM 9.1 05/14/2017 0345   PROT 6.6 05/14/2017 0345   ALBUMIN 3.4 (L) 05/14/2017 0345   AST 22 05/14/2017 0345   ALT 20 05/14/2017 0345   ALKPHOS 86 05/14/2017 0345   BILITOT 0.9 05/14/2017 0345   GFRNONAA >60 05/14/2017 0345   GFRNONAA >89 08/16/2013 1448   GFRAA >60 05/14/2017 0345   GFRAA >89 08/16/2013 1448   Lipid Panel     Component Value Date/Time   CHOL 243 (H) 05/14/2017 0345   TRIG 454 (H) 05/14/2017 0345   HDL 29 (L) 05/14/2017 0345   CHOLHDL 8.4 05/14/2017 0345   VLDL UNABLE TO CALCULATE IF TRIGLYCERIDE OVER 400 mg/dL 05/14/2017 0345   LDLCALC UNABLE TO CALCULATE IF TRIGLYCERIDE OVER 400 mg/dL 05/14/2017 0345  Troponin 0 0.12  Imaging I have reviewed the images obtained: CT-scan of the brain - ASPECTS 10. No acute changes. 2D echocardiogram from 05/13/2017 showed reduced ejection fraction 30 to 35% with moderate diffuse hypokinesis with no identifiable regional variations.  Left atrium was mildly dilated.  Assessment:  62 year old man past history of A. fib on Xarelto, hypertension, hyperlipidemia, diabetes,  polysubstance abuse, tall abuse brought in for generalized weakness and hyperglycemia few weeks ago brought in again for altered mental  status and possible right-sided weakness. On examination in the emergency room, NIH 0. Initial fingerstick blood glucose was extremely high to the point that it was unreadable by the machine. Repeat blood sugar levels 472. Most likely toxic metabolic encephalopathy in the setting of hyperglycemia. He has received a stroke work-up 2 weeks ago and I would not recommend any further stroke evaluation because of nonfocal exam. He also has elevated troponin.   Impression: Toxic metabolic encephalopathy Hyperglycemia-evaluate for diabetic ketoacidosis Evaluate for ACS  Recommendations: -At this point, I would recommend medical work-up for toxic metabolic encephalopathy and hyperglycemia. -ACS work-up per primary team -I would not recommend further brain imaging as his exam is completely nonfocal. No stroke w/u indicated at this time. -Check UDS -Please call back neurology as needed if there is any deterioration in his neurological exam. -Cancel code stroke  Plan was communicated to Dr. Rogene Houston in the ER.  -- Amie Portland, MD [facility] Pager: [phone] If 7pm to 7am, please call on call as listed on AMION.  CRITICAL CARE ATTESTATION This patient is critically ill and at significant risk of neurological worsening, death and care requires constant monitoring of vital signs, hemodynamics,respiratory and cardiac monitoring. I spent 55  minutes of neurocritical care time performing neurological assessment, discussion with family, other specialists and medical decision making of high complexity in the care of  this patient.

## 2017-05-27 NOTE — ED Notes (Signed)
Notified Amber w/ [facility] to cancel the Code Stroke per Dr. Rory Percy @ 1930.

## 2017-05-28 ENCOUNTER — Encounter (HOSPITAL_COMMUNITY): Payer: Self-pay

## 2017-05-28 ENCOUNTER — Other Ambulatory Visit: Payer: Self-pay

## 2017-05-28 DIAGNOSIS — I959 Hypotension, unspecified: Secondary | ICD-10-CM | POA: Diagnosis not present

## 2017-05-28 DIAGNOSIS — G9341 Metabolic encephalopathy: Secondary | ICD-10-CM | POA: Diagnosis not present

## 2017-05-28 DIAGNOSIS — E0810 Diabetes mellitus due to underlying condition with ketoacidosis without coma: Secondary | ICD-10-CM

## 2017-05-28 DIAGNOSIS — G92 Toxic encephalopathy: Secondary | ICD-10-CM | POA: Diagnosis not present

## 2017-05-28 DIAGNOSIS — I480 Paroxysmal atrial fibrillation: Secondary | ICD-10-CM | POA: Diagnosis not present

## 2017-05-28 DIAGNOSIS — F1010 Alcohol abuse, uncomplicated: Secondary | ICD-10-CM | POA: Diagnosis not present

## 2017-05-28 DIAGNOSIS — F1920 Other psychoactive substance dependence, uncomplicated: Secondary | ICD-10-CM | POA: Diagnosis not present

## 2017-05-28 DIAGNOSIS — I5042 Chronic combined systolic (congestive) and diastolic (congestive) heart failure: Secondary | ICD-10-CM | POA: Diagnosis not present

## 2017-05-28 LAB — BASIC METABOLIC PANEL
ANION GAP: 7 (ref 5–15)
Anion gap: 11 (ref 5–15)
Anion gap: 9 (ref 5–15)
Anion gap: 8 (ref 5–15)
Anion gap: 6 (ref 5–15)
BUN: 9 mg/dL (ref 6–20)
BUN: 7 mg/dL (ref 6–20)
BUN: 9 mg/dL (ref 6–20)
BUN: 10 mg/dL (ref 6–20)
BUN: 11 mg/dL (ref 6–20)
CALCIUM: 9 mg/dL (ref 8.9–10.3)
CALCIUM: 6.7 mg/dL — AB (ref 8.9–10.3)
CALCIUM: 8.9 mg/dL (ref 8.9–10.3)
CALCIUM: 9 mg/dL (ref 8.9–10.3)
CALCIUM: 9.4 mg/dL (ref 8.9–10.3)
CHLORIDE: 103 mmol/L (ref 101–111)
CO2: 19 mmol/L — ABNORMAL LOW (ref 22–32)
CO2: 25 mmol/L (ref 22–32)
CO2: 27 mmol/L (ref 22–32)
CO2: 23 mmol/L (ref 22–32)
CO2: 23 mmol/L (ref 22–32)
CREATININE: 0.91 mg/dL (ref 0.61–1.24)
CREATININE: 0.99 mg/dL (ref 0.61–1.24)
CREATININE: 1.1 mg/dL (ref 0.61–1.24)
CREATININE: 1.12 mg/dL (ref 0.61–1.24)
Chloride: 107 mmol/L (ref 101–111)
Chloride: 109 mmol/L (ref 101–111)
Chloride: 101 mmol/L (ref 101–111)
Chloride: 107 mmol/L (ref 101–111)
Creatinine, Ser: 1.17 mg/dL (ref 0.61–1.24)
GFR calc Af Amer: >60 mL/min (ref >60)
GFR calc Af Amer: >60 mL/min (ref >60)
GFR calc Af Amer: >60 mL/min (ref >60)
GFR calc Af Amer: >60 mL/min (ref >60)
GFR calc Af Amer: >60 mL/min (ref >60)
GFR calc non Af Amer: >60 mL/min (ref >60)
GFR calc non Af Amer: >60 mL/min (ref >60)
GFR calc non Af Amer: >60 mL/min (ref >60)
GLUCOSE: 1006 mg/dL — AB (ref 65–99)
GLUCOSE: 144 mg/dL — AB (ref 65–99)
GLUCOSE: 159 mg/dL — AB (ref 65–99)
GLUCOSE: 240 mg/dL — AB (ref 65–99)
Glucose, Bld: 374 mg/dL — ABNORMAL HIGH (ref 65–99)
POTASSIUM: 5 mmol/L (ref 3.5–5.1)
Potassium: 4.9 mmol/L (ref 3.5–5.1)
Potassium: 3.8 mmol/L (ref 3.5–5.1)
Potassium: 2.8 mmol/L — ABNORMAL LOW (ref 3.5–5.1)
Potassium: 4.2 mmol/L (ref 3.5–5.1)
SODIUM: 136 mmol/L (ref 135–145)
Sodium: 141 mmol/L (ref 135–145)
Sodium: 141 mmol/L (ref 135–145)
Sodium: 127 mmol/L — ABNORMAL LOW (ref 135–145)
Sodium: 140 mmol/L (ref 135–145)

## 2017-05-28 LAB — CBG MONITORING, ED
GLUCOSE-CAPILLARY: 141 mg/dL — AB (ref 65–99)
Glucose-Capillary: 214 mg/dL — ABNORMAL HIGH (ref 65–99)
Glucose-Capillary: 214 mg/dL — ABNORMAL HIGH (ref 65–99)
Glucose-Capillary: 290 mg/dL — ABNORMAL HIGH (ref 65–99)
Glucose-Capillary: 373 mg/dL — ABNORMAL HIGH (ref 65–99)

## 2017-05-28 LAB — TROPONIN I
TROPONIN I: 0.1 ng/mL — AB (ref <0.03)
Troponin I: 0.11 ng/mL (ref <0.03)
Troponin I: 0.1 ng/mL (ref <0.03)

## 2017-05-28 LAB — GLUCOSE, CAPILLARY
GLUCOSE-CAPILLARY: 115 mg/dL — AB (ref 65–99)
GLUCOSE-CAPILLARY: 182 mg/dL — AB (ref 65–99)
GLUCOSE-CAPILLARY: 314 mg/dL — AB (ref 65–99)
Glucose-Capillary: 138 mg/dL — ABNORMAL HIGH (ref 65–99)
Glucose-Capillary: 160 mg/dL — ABNORMAL HIGH (ref 65–99)
Glucose-Capillary: 148 mg/dL — ABNORMAL HIGH (ref 65–99)
Glucose-Capillary: 165 mg/dL — ABNORMAL HIGH (ref 65–99)
Glucose-Capillary: 207 mg/dL — ABNORMAL HIGH (ref 65–99)
Glucose-Capillary: 325 mg/dL — ABNORMAL HIGH (ref 65–99)

## 2017-05-28 LAB — MAGNESIUM: Magnesium: 1.8 mg/dL (ref 1.7–2.4)

## 2017-05-28 LAB — LACTIC ACID, PLASMA: Lactic Acid, Venous: 1.6 mmol/L (ref 0.5–1.9)

## 2017-05-28 MED ORDER — TORSEMIDE 20 MG PO TABS
20.0000 mg | ORAL_TABLET | Freq: Two times a day (BID) | ORAL | Status: DC
  Administered 2017-05-28: 20 mg via ORAL
  Filled 2017-05-28: qty 1

## 2017-05-28 MED ORDER — INSULIN ASPART 100 UNIT/ML ~~LOC~~ SOLN
4.0000 [IU] | Freq: Three times a day (TID) | SUBCUTANEOUS | Status: DC
  Administered 2017-05-28: 4 [IU] via SUBCUTANEOUS

## 2017-05-28 MED ORDER — SPIRONOLACTONE 12.5 MG HALF TABLET
12.5000 mg | ORAL_TABLET | Freq: Every day | ORAL | Status: DC
  Administered 2017-05-28: 12.5 mg via ORAL
  Filled 2017-05-28 (×2): qty 1

## 2017-05-28 MED ORDER — INSULIN ASPART 100 UNIT/ML ~~LOC~~ SOLN
0.0000 [IU] | Freq: Three times a day (TID) | SUBCUTANEOUS | Status: DC
  Administered 2017-05-28: 7 [IU] via SUBCUTANEOUS

## 2017-05-28 MED ORDER — INSULIN ASPART 100 UNIT/ML ~~LOC~~ SOLN
6.0000 [IU] | Freq: Three times a day (TID) | SUBCUTANEOUS | Status: DC
  Administered 2017-05-28 – 2017-05-31 (×10): 6 [IU] via SUBCUTANEOUS

## 2017-05-28 MED ORDER — FUROSEMIDE 10 MG/ML IJ SOLN
40.0000 mg | Freq: Once | INTRAMUSCULAR | Status: AC
  Administered 2017-05-28: 40 mg via INTRAVENOUS
  Filled 2017-05-28: qty 4

## 2017-05-28 MED ORDER — SODIUM CHLORIDE 0.9 % IV SOLN
INTRAVENOUS | Status: DC
  Administered 2017-05-28: 1.1 [IU]/h via INTRAVENOUS
  Filled 2017-05-28: qty 1

## 2017-05-28 MED ORDER — INSULIN ASPART 100 UNIT/ML ~~LOC~~ SOLN
0.0000 [IU] | Freq: Every day | SUBCUTANEOUS | Status: DC
  Administered 2017-05-28 – 2017-05-29 (×2): 4 [IU] via SUBCUTANEOUS
  Administered 2017-05-30: 3 [IU] via SUBCUTANEOUS
  Administered 2017-05-31: 2 [IU] via SUBCUTANEOUS
  Administered 2017-06-01: 3 [IU] via SUBCUTANEOUS
  Administered 2017-06-02: 2 [IU] via SUBCUTANEOUS

## 2017-05-28 MED ORDER — INSULIN GLARGINE 100 UNIT/ML ~~LOC~~ SOLN
25.0000 [IU] | Freq: Two times a day (BID) | SUBCUTANEOUS | Status: DC
  Administered 2017-05-28 – 2017-05-30 (×6): 25 [IU] via SUBCUTANEOUS
  Filled 2017-05-28 (×7): qty 0.25

## 2017-05-28 MED ORDER — INSULIN ASPART 100 UNIT/ML ~~LOC~~ SOLN
0.0000 [IU] | Freq: Three times a day (TID) | SUBCUTANEOUS | Status: DC
  Administered 2017-05-28: 15 [IU] via SUBCUTANEOUS
  Administered 2017-05-29: 4 [IU] via SUBCUTANEOUS
  Administered 2017-05-29: 11 [IU] via SUBCUTANEOUS
  Administered 2017-05-29 – 2017-05-30 (×4): 7 [IU] via SUBCUTANEOUS
  Administered 2017-05-31: 11 [IU] via SUBCUTANEOUS
  Administered 2017-05-31: 15 [IU] via SUBCUTANEOUS
  Administered 2017-05-31 – 2017-06-01 (×3): 11 [IU] via SUBCUTANEOUS
  Administered 2017-06-01 – 2017-06-02 (×3): 7 [IU] via SUBCUTANEOUS
  Administered 2017-06-02: 11 [IU] via SUBCUTANEOUS
  Administered 2017-06-03: 12 [IU] via SUBCUTANEOUS
  Administered 2017-06-03: 11 [IU] via SUBCUTANEOUS
  Administered 2017-06-03: 7 [IU] via SUBCUTANEOUS

## 2017-05-28 NOTE — Evaluation (Signed)
Physical Therapy Evaluation Patient Details Name: [name] [name] MRN: [id number] DOB: [birth year] Today's Date: 05/28/2017   History of Present Illness  63 yo male with onset of DKA, home alone with 3 hours a day personal care attendant.  Had confusion and gait changes with R side weakness prior to admission.  PMHx:  Substance abuse, pancreatitis, schizophrenia, CHF, CKD 3, cocaine use,  HTN, DM, stroke, depression  Clinical Impression  Pt is getting up to walk from a recliner, and is unsteady initially as well as having some moments with safety awareness.  His plan is to get home with caregivers and will have family to assist as well.  O2 sats were stable for gait without cannula at 95% so will not currently need to take O2 home.  Follow acutely for strengthening and standing balance as needed with progression to lesser AD when able.  Follow Up Recommendations Home health PT;Supervision - Intermittent    Equipment Recommendations  Rolling walker with 5" wheels    Recommendations for Other Services       Precautions / Restrictions Precautions Precautions: Fall Precaution Comments: removed O2 for visit Restrictions Weight Bearing Restrictions: No      Mobility  Bed Mobility               General bed mobility comments: in chair when PT arrived  Transfers Overall transfer level: Needs assistance Equipment used: Rolling walker (2 wheeled);1 person hand held assist Transfers: Sit to/from Stand Sit to Stand: Min assist         General transfer comment: assisted to power up from lower recliner  Ambulation/Gait Ambulation/Gait assistance: Min guard(for safety) Ambulation Distance (Feet): 45 Feet Assistive device: Rolling walker (2 wheeled) Gait Pattern/deviations: Decreased stride length;Step-through pattern;Wide base of support;Trunk flexed Gait velocity: reduced Gait velocity interpretation: <1.31 ft/sec, indicative of household ambulator General Gait Details: Has O2  sats in 97% range with no supplemental O2 used.  Stairs            Wheelchair Mobility    Modified Rankin (Stroke Patients Only)       Balance Overall balance assessment: Needs assistance Sitting-balance support: Feet supported Sitting balance-Leahy Scale: Fair     Standing balance support: Bilateral upper extremity supported;During functional activity Standing balance-Leahy Scale: Poor                               Pertinent Vitals/Pain Pain Assessment: No/denies pain    Home Living Family/patient expects to be discharged to:: Private residence Living Arrangements: Other (Comment) Available Help at Discharge: Family;Personal care attendant;Available PRN/intermittently Type of Home: Apartment Home Access: Level entry     Home Layout: One level Home Equipment: Cane - single point;Grab bars - tub/shower;Shower seat      Prior Function Level of Independence: Independent with assistive device(s)         Comments: cane at times when needed     Hand Dominance   Dominant Hand: Right    Extremity/Trunk Assessment                Communication   Communication: No difficulties  Cognition Arousal/Alertness: Awake/alert Behavior During Therapy: WFL for tasks assessed/performed Overall Cognitive Status: Within Functional Limits for tasks assessed  General Comments General comments (skin integrity, edema, etc.): HR is controlled with gait and mobiltiy    Exercises     Assessment/Plan    PT Assessment Patient needs continued PT services  PT Problem List Decreased strength;Decreased range of motion;Decreased activity tolerance;Decreased balance;Decreased mobility;Decreased coordination;Decreased knowledge of use of DME;Decreased safety awareness;Cardiopulmonary status limiting activity;Obesity;Decreased skin integrity       PT Treatment Interventions DME instruction;Gait  training;Functional mobility training;Therapeutic activities;Therapeutic exercise;Balance training;Neuromuscular re-education;Patient/family education    PT Goals (Current goals can be found in the Care Plan section)  Acute Rehab PT Goals Patient Stated Goal: to go home PT Goal Formulation: With patient Time For Goal Achievement: 06/11/17 Potential to Achieve Goals: Good    Frequency Min 3X/week   Barriers to discharge Decreased caregiver support has no daytime help at times    Co-evaluation               AM-PAC PT "6 Clicks" Daily Activity  Outcome Measure Difficulty turning over in bed (including adjusting bedclothes, sheets and blankets)?: None Difficulty moving from lying on back to sitting on the side of the bed? : A Little Difficulty sitting down on and standing up from a chair with arms (e.g., wheelchair, bedside commode, etc,.)?: Unable Help needed moving to and from a bed to chair (including a wheelchair)?: A Little Help needed walking in hospital room?: A Little Help needed climbing 3-5 steps with a railing? : A Lot 6 Click Score: 16    End of Session Equipment Utilized During Treatment: Gait belt Activity Tolerance: Patient tolerated treatment well Patient left: in chair;with call bell/phone within reach;with chair alarm set Nurse Communication: Mobility status PT Visit Diagnosis: Unsteadiness on feet (R26.81);Muscle weakness (generalized) (M62.81);Adult, failure to thrive (R62.7)    Time: 6568-1275 PT Time Calculation (min) (ACUTE ONLY): 25 min   Charges:   PT Evaluation $PT Eval Moderate Complexity: 1 Mod PT Treatments $Gait Training: 8-22 mins   PT G Codes:   PT G-Codes **NOT FOR INPATIENT CLASS** Functional Assessment Tool Used: AM-PAC 6 Clicks Basic Mobility    Ramond Dial 05/28/2017, 6:07 PM   Mee Hives, PT MS Acute Rehab Dept. Number: [place]

## 2017-05-28 NOTE — Progress Notes (Signed)
Nutrition Education Note  RD consulted for nutrition education regarding diabetes and CHF.   Lab Results  Component Value Date   HGBA1C 14.7 (H) 05/13/2017    RD provided "Heart Healthy Carbohydrate Consistent Nutrition Therapy" handout from the Academy of Nutrition and Dietetics. Reviewed patient's dietary recall. Provided examples on ways to decrease sodium intake in diet. Discouraged intake of processed foods and use of salt shaker.   Discussed importance of controlled and consistent carbohydrate intake throughout the day. Provided examples of ways to balance meals/snacks and encouraged intake of high-fiber, whole grain complex carbohydrates. Teach back method used.  Expect fair compliance.  Body mass index is 38.77 kg/m. Pt meets criteria for obesity, class 2 based on current BMI.  Current diet order is CHO modified, patient is consuming approximately 100% of meals at this time. Labs and medications reviewed. No further nutrition interventions warranted at this time. RD contact information provided. If additional nutrition issues arise, please re-consult RD.  Molli Barrows, RD, LDN, [place] Pager [phone] After Hours Pager [phone]

## 2017-05-28 NOTE — Progress Notes (Signed)
Inpatient Diabetes Program Recommendations  AACE/ADA: New Consensus Statement on Inpatient Glycemic Control (2015)  Target Ranges:  Prepandial:   less than 140 mg/dL      Peak postprandial:   less than 180 mg/dL (1-2 hours)      Critically ill patients:  140 - 180 mg/dL   Lab Results  Component Value Date   GLUCAP 165 (H) 05/28/2017   HGBA1C 14.7 (H) 05/13/2017    Review of Glycemic Control  Diabetes history: DM2 Outpatient Diabetes medications: Lantus 50 units QHS, Novolin R 18 units tidwc, metformin 1000 mg bid,  Current orders for Inpatient glycemic control: Lantus 25 units bid  Inpatient Diabetes Program Recommendations:     Add Novolog 0-20 units tidwc and hs Add Novolog 6 units tidwc for meal coverage insulin if pt eats > 50% meal.  Very familiar with pt from previous admission. Spent a lot of time with pt instructing on importance of controlling his blood sugar to reduce risk of complications. Will see pt this am.  Thank you. Lorenda Peck, RD, LDN, CDE Inpatient Diabetes Coordinator [phone]

## 2017-05-28 NOTE — Progress Notes (Signed)
PROGRESS NOTE  CLABORN JANUSZ UXN:[id number] DOB: 11-Nov-1955 DOA: 05/27/2017 PCP: Rogers Blocker, MD  Brief summary:  Per ED intake on 5/2, Pt at 1837 family noticed pt was leaning to the right with new onset of AMS. Ems states that pt was weaker on the right side had a TIA about two weeks ago. Pt was axox2 per EMS. Pt upon arrival to ED was axox4 with no deficits at this time.   Acute stroke activated in the ED, per neurology , likely toxic metabolic encephalopathy, not recommend further brain imaging as his exam is completely nonfocal. No stroke w/u indicated at this time.     HPI/Recap of past 24 hours:  Gap closed, blood glucose improving C/o sob, no fever, no chest pain, no edema He is sitting up in chair, drowsy but oriented x3, able to provide detailed history He reports chronic back pain, nonradiating  Assessment/Plan: Principal Problem:   DKA (diabetic ketoacidoses) ([place]) Active Problems:   HTN (hypertension)   Alcohol abuse   Paroxysmal atrial fibrillation (HCC)   Chronic combined systolic (congestive) and diastolic (congestive) heart failure (HCC)   Elevated troponin   Acute metabolic encephalopathy   CKD (chronic kidney disease), stage III ([place])   Stroke ([place])  Acute metabolic encephalopathy:  -Mental status has improved to the baseline.  Currently oriented x3.  - Patient had questionable right-sided weakness.  CT head with no acute intracranial abnormalities.  Neurology was consulted, Dr. Rory Percy did not think this is stroke, and did not recommend further stroke work-up.  Per Dr. Rory Percy it is most likely toxic metabolic encephalopathy.  -he has mild DKA with elevated blood glucose at 472 on presentation -UDS + opioids and cocaine -ua no infection, cxr no pna. -improving  DKA (diabetic ketoacidoses) [facility]): Patient has elevated anion gap of 16, bicarbonate 19, blood sugar 472, indicating possible mild DKA.   He is started on insulin drip, gap down to 6, he is  transitioned off insulin drip, started on subQinsulin.  Insulin dependent dm2,  Recent a1c 14.7 -diabetes /diet education   HTN:  -Continue home medications: Coreg , entresto, spironolactone, torsemide -IV hydralazine prn  Paroxysmal Atrial Fibrillation:  I have personally reviewed EKD and tele strips, it showed sinus rhythm with first degree av block with frequent pvc's CHA2DS2-VASc Score is 6, needs oral anticoagulation. Patient is on Xarelto at home. -continue Xarelto and coreg   Chronic combined systolic (congestive) and diastolic (congestive) heart failure ([place]):  -2D echo on 05/13/2017 showed EF of 30-35% with grade 1 diastolic dysfunction.  Patient does not have leg edema or JVD.  No respiratory distress.  -cxr with cardiomegaly, mild vascular congestion, -he received hydration for dka , will d/c ivf, resume diuretics - Continue coreg, Entresto, spironolactone, torsamide -close monitor volume status  Chronically Elevated troponin: Troponin is chronically elevated, recent baseline 0.08-0.10.  His POC trop 0.12 and then trop 0.10. No CP.  at  baseline.  --on ASA, crestor and coreg  CKD (chronic kidney disease), stage II: stable. Baseline creatinine 1.0-1.3.  His creatinine is 1.30, BUN 13.   Hx of Stroke [facility]): -Crestor ASA and crestor   Polysubstance abuse:  uds + opioids and cocaine Alcohol abuse: -CIWA protocol     Code Status: full  Family Communication: patient   Disposition Plan: transfer out of stepdown to med tele home with home health in 1-2 days   Consultants:  none  Procedures:  none  Antibiotics:  none   Objective: BP 101/73 (  BP Location: Right Arm)   Pulse 96   Temp 97.6 F (36.4 C) (Oral)   Resp (!) 21   Ht 5\' 11"  (1.803 m)   Wt 126.1 kg (278 lb)   SpO2 96%   BMI 38.77 kg/m   Intake/Output Summary (Last 24 hours) at 05/28/2017 0804 Last data filed at 05/28/2017 0553 Gross per 24 hour  Intake 2572.68 ml  Output 500 ml    Net 2072.68 ml   Filed Weights   05/28/17 0505  Weight: 126.1 kg (278 lb)    Exam: Patient is examined daily including today on 05/28/2017, exams remain the same as of yesterday except that has changed    General:  Lethargic, but oriented x3  Cardiovascular: RRR  Respiratory: CTABL  Abdomen: Soft/ND/NT, positive BS  Musculoskeletal: No Edema, chronic venous stasis changes, skin is dark and hard  Neuro: drowsy, oriented x3, no focal weakness  Data Reviewed: Basic Metabolic Panel: Recent Labs  Lab 05/27/17 1910 05/27/17 1918 05/28/17 0001 05/28/17 0335 05/28/17 0545  NA 136 138 141 141 127*  K 4.4 4.0 4.9 3.8 2.8*  CL 101 104 107 109 101  CO2 19*  --  23 23 19*  GLUCOSE 456* 472* 240* 159* 1,006*  BUN 12 13 11 9 7   CREATININE 1.34* 1.30* 1.12 0.99 0.91  CALCIUM 9.4  --  9.4 9.0 6.7*   Liver Function Tests: Recent Labs  Lab 05/27/17 1910  AST 33  ALT 45  ALKPHOS 98  BILITOT 0.9  PROT 7.0  ALBUMIN 4.0   No results for input(s): LIPASE, AMYLASE in the last 168 hours. No results for input(s): AMMONIA in the last 168 hours. CBC: Recent Labs  Lab 05/27/17 1910 05/27/17 1918  WBC 6.3  --   NEUTROABS 3.2  --   HGB 15.5 16.3  HCT 46.2 48.0  MCV 90.9  --   PLT 175  --    Cardiac Enzymes:   Recent Labs  Lab 05/28/17 0001 05/28/17 0335  TROPONINI 0.10* 0.11*   BNP (last 3 results) Recent Labs    11/26/16 1740 05/13/17 0317 05/27/17 2140  BNP 20.7 38.0 67.8    ProBNP (last 3 results) No results for input(s): PROBNP in the last 8760 hours.  CBG: Recent Labs  Lab 05/28/17 0320 05/28/17 0411 05/28/17 0527 05/28/17 0641 05/28/17 0751  GLUCAP 290* 141* 138* 115* 160*    No results found for this or any previous visit (from the past 240 hour(s)).   Studies: Dg Chest Port 1 View  Result Date: 05/27/2017 CLINICAL DATA:  Altered mental status EXAM: PORTABLE CHEST 1 VIEW COMPARISON:  05/13/2017, 05/12/2017, 11/26/2016 FINDINGS: Mild  cardiomegaly with central vascular congestion. No acute opacity or pleural effusion. No pneumothorax. IMPRESSION: Cardiomegaly with mild central congestion. Electronically Signed   By: Donavan Foil M.D.   On: 05/27/2017 21:07   Ct Head Code Stroke Wo Contrast  Result Date: 05/27/2017 CLINICAL DATA:  Code stroke. 62 y/o M; altered level of consciousness, right-sided deficits. EXAM: CT HEAD WITHOUT CONTRAST TECHNIQUE: Contiguous axial images were obtained from the base of the skull through the vertex without intravenous contrast. COMPARISON:  05/12/2017 CT head.  05/13/2017 MRI head. FINDINGS: Brain: No evidence of acute infarction, hemorrhage, hydrocephalus, extra-axial collection or mass lesion/mass effect. Stable chronic microvascular ischemic changes and parenchymal volume loss of the brain. Vascular: Calcific atherosclerosis of carotid siphons. No hyperdense vessel identified. Skull: Normal. Negative for fracture or focal lesion. Sinuses/Orbits: No acute finding. Other: None. ASPECTS (  [place] Stroke Program Early CT Score) - Ganglionic level infarction (caudate, lentiform nuclei, internal capsule, insula, M1-M3 cortex): 7 - Supraganglionic infarction (M4-M6 cortex): 3 Total score (0-10 with 10 being normal): 10 IMPRESSION: 1. No acute intracranial abnormality identified. 2. ASPECTS is 10 3. Stable chronic microvascular ischemic changes and parenchymal volume loss of the brain. These results were communicated to Dr. Rory Percy at 7:40 pmon 5/2/2019by text page via the [facility] messaging system. Electronically Signed   By: Kristine Garbe M.D.   On: 05/27/2017 19:41    Scheduled Meds: . aspirin EC  81 mg Oral Daily  . carvedilol  6.25 mg Oral BID WC  . folic acid  1 mg Oral Daily  . LORazepam  0-4 mg Intravenous Q6H   Followed by  . [START ON 05/29/2017] LORazepam  0-4 mg Intravenous Q12H  . multivitamin with minerals  1 tablet Oral Daily  . QUEtiapine  800 mg Oral QHS  . rivaroxaban  20 mg Oral  Daily  . sacubitril-valsartan  1 tablet Oral BID  . thiamine  100 mg Oral Daily   Or  . thiamine  100 mg Intravenous Daily    Continuous Infusions: . sodium chloride 100 mL/hr at 05/28/17 0506  . dextrose 5 % and 0.45% NaCl 100 mL/hr at 05/28/17 0020  . insulin (NOVOLIN-R) infusion 0.6 Units/hr (05/28/17 8841)     Time spent: 35 mins I have personally reviewed and interpreted on  05/28/2017 daily labs, tele strips, imagings as discussed above under date review session and assessment and plans.  I reviewed all nursing notes, pharmacy notes, consultant notes,  vitals, pertinent old records  I have discussed plan of care as described above with RN , patient  on 05/28/2017   Florencia Reasons MD, PhD  [facility] Pager [phone]. If 7PM-7AM, please contact night-coverage at [email or web address], password [facility] 05/28/2017, 8:04 AM  LOS: 0 days

## 2017-05-28 NOTE — Care Management Obs Status (Signed)
[place] NOTIFICATION   Patient Details  Name: [name] [name] MRN: [id number] Date of Birth: 12-02-1955   Medicare Observation Status Notification Given:  Yes    Bethena Roys, RN 05/28/2017, 4:58 PM

## 2017-05-28 NOTE — Care Management Note (Addendum)
Case Management Note  Patient Details  Name: TZVI ECONOMOU MRN: [id number] Date of Birth: 1955/11/29  Subjective/Objective: Pt presented for DKA- PTA from home alone and has a personal care aide 7 days a week 3 hr/day. Pt states he lives in an apartment and will return. PT recommendations for [facility] PT- pt declined [place] @ this time. Pt wants DME RW for home. Please place order.                   Action/Plan: CM unable to make referral for DME at this time. Weekend CM to assist with DME.  Expected Discharge Date:                  Expected Discharge Plan:  Home/Self Care  In-House Referral:  NA  Discharge planning Services  CM Consult  Post Acute Care Choice:  Durable Medical Equipment Choice offered to:  Patient  DME Arranged:  [place] rolling DME Agency:   [place] Arranged:  Patient Refused [place] Agency:  NA  Status of Service:  Completed, signed off  If discussed at [place] of Stay Meetings, dates discussed:    Additional Comments: 1029 05-31-17 Jacqlyn Krauss, RN,BSN [phone] CM did speak with pt in regards to DME- Pt states ok to use [facility] for DME to be delivered to room. CM did make referral with [facility] and RW to be delivered 05-31-17. No further needs from CM @ this time.  Bethena Roys, RN 05/28/2017, 4:59 PM

## 2017-05-28 NOTE — ED Notes (Signed)
Report attempted x 1

## 2017-05-29 ENCOUNTER — Encounter (HOSPITAL_COMMUNITY): Payer: Self-pay

## 2017-05-29 DIAGNOSIS — E0810 Diabetes mellitus due to underlying condition with ketoacidosis without coma: Secondary | ICD-10-CM | POA: Diagnosis not present

## 2017-05-29 LAB — CBC
HEMATOCRIT: 43.7 % (ref 39.0–52.0)
Hemoglobin: 14 g/dL (ref 13.0–17.0)
MCH: 29.4 pg (ref 26.0–34.0)
MCHC: 32 g/dL (ref 30.0–36.0)
MCV: 91.8 fL (ref 78.0–100.0)
Platelets: 152 10*3/uL (ref 150–400)
RBC: 4.76 MIL/uL (ref 4.22–5.81)
RDW: 14.8 % (ref 11.5–15.5)
WBC: 5.7 10*3/uL (ref 4.0–10.5)

## 2017-05-29 LAB — BASIC METABOLIC PANEL
ANION GAP: 9 (ref 5–15)
BUN: 11 mg/dL (ref 6–20)
CALCIUM: 9.3 mg/dL (ref 8.9–10.3)
CO2: 26 mmol/L (ref 22–32)
Chloride: 100 mmol/L — ABNORMAL LOW (ref 101–111)
Creatinine, Ser: 1.08 mg/dL (ref 0.61–1.24)
GFR calc Af Amer: >60 mL/min (ref >60)
Glucose, Bld: 206 mg/dL — ABNORMAL HIGH (ref 65–99)
POTASSIUM: 3.8 mmol/L (ref 3.5–5.1)
SODIUM: 135 mmol/L (ref 135–145)

## 2017-05-29 LAB — GLUCOSE, CAPILLARY
GLUCOSE-CAPILLARY: 252 mg/dL — AB (ref 65–99)
Glucose-Capillary: 238 mg/dL — ABNORMAL HIGH (ref 65–99)
Glucose-Capillary: 183 mg/dL — ABNORMAL HIGH (ref 65–99)
Glucose-Capillary: 237 mg/dL — ABNORMAL HIGH (ref 65–99)
Glucose-Capillary: 334 mg/dL — ABNORMAL HIGH (ref 65–99)

## 2017-05-29 MED ORDER — TORSEMIDE 20 MG PO TABS: 40.0000 mg | ORAL_TABLET | Freq: Every day | ORAL | Status: DC

## 2017-05-29 MED ORDER — SODIUM CHLORIDE 0.9 % IV BOLUS
500.0000 mL | Freq: Once | INTRAVENOUS | Status: AC
  Administered 2017-05-29: 500 mL via INTRAVENOUS

## 2017-05-29 MED ORDER — TORSEMIDE 20 MG PO TABS: 20.0000 mg | ORAL_TABLET | Freq: Every day | ORAL | Status: DC

## 2017-05-29 MED ORDER — CARVEDILOL 3.125 MG PO TABS
3.1250 mg | ORAL_TABLET | Freq: Two times a day (BID) | ORAL | Status: DC
  Administered 2017-05-29 – 2017-06-03 (×10): 3.125 mg via ORAL
  Filled 2017-05-29 (×12): qty 1

## 2017-05-29 NOTE — Progress Notes (Signed)
PROGRESS NOTE  MANUAL NAVARRA KPT:[id number] DOB: 1955-03-01 DOA: 05/27/2017 PCP: Rogers Blocker, MD  Brief summary:  Per ED intake on 5/2, Pt at 1837 family noticed pt was leaning to the right with new onset of AMS. Ems states that pt was weaker on the right side had a TIA about two weeks ago. Pt was axox2 per EMS. Pt upon arrival to ED was axox4 with no deficits at this time.   Acute stroke activated in the ED, per neurology , likely toxic metabolic encephalopathy, not recommend further brain imaging as his exam is completely nonfocal. No stroke w/u indicated at this time.     HPI/Recap of past 24 hours:  Blood pressure dropped to the 70's He felt dizzy standing up Denies chest pain, no sob, no fever, no edema He received 500cc bolus fluids, sbp now in the 80's  Assessment/Plan: Principal Problem:   DKA (diabetic ketoacidoses) (HCC) Active Problems:   HTN (hypertension)   Alcohol abuse   Paroxysmal atrial fibrillation (HCC)   Chronic combined systolic (congestive) and diastolic (congestive) heart failure (HCC)   Elevated troponin   Acute metabolic encephalopathy   CKD (chronic kidney disease), stage III ([place])   Stroke ([place])  Acute metabolic encephalopathy:  -Mental status has improved to the baseline.  Currently oriented x3.  - Patient had questionable right-sided weakness.  CT head with no acute intracranial abnormalities.  Neurology was consulted, Dr. Rory Percy did not think this is stroke, and did not recommend further stroke work-up.  Per Dr. Rory Percy it is most likely toxic metabolic encephalopathy.  -he has mild DKA with elevated blood glucose at 472 on presentation -UDS + opioids and cocaine -ua no infection, cxr no pna. -improving  DKA (diabetic ketoacidoses) [facility]): Patient has elevated anion gap of 16, bicarbonate 19, blood sugar 472, indicating possible mild DKA.   He is started on insulin drip, gap down to 6, he is transitioned off insulin drip, started on  subQinsulin.  Insulin dependent dm2,  Recent a1c 14.7 -he reports he has highs in the 400's and lows down to the 20's at home, lantus dose splits into 25units bid to hopefully prevent extreme high's or extreme lows -diabetes /diet education   HTN:  -now has hypotension,  -hold all diuretics, coreg/entresto with parameters   Paroxysmal Atrial Fibrillation:  I have personally reviewed EKD and tele strips, it showed sinus rhythm with first degree av block with frequent pvc's CHA2DS2-VASc Score is 6, needs oral anticoagulation. Patient is on Xarelto at home. -continue Xarelto and coreg   Chronic combined systolic (congestive) and diastolic (congestive) heart failure ([place]):  -2D echo on 05/13/2017 showed EF of 30-35% with grade 1 diastolic dysfunction.  Patient does not have leg edema or JVD.  No respiratory distress.  -cxr with cardiomegaly, mild vascular congestion, -he received hydration for dka , will d/c ivf, resume diuretics - Continue coreg, Entresto with parametes Hold  spironolactone, torsamide today due to hypotenstion -close monitor volume status  Chronically Elevated troponin: Troponin is chronically elevated, recent baseline 0.08-0.10.  His POC trop 0.12 and then trop 0.10. No CP.  at  baseline.  --on ASA, crestor and coreg  CKD (chronic kidney disease), stage II: stable. Baseline creatinine 1.0-1.3.  His creatinine is 1.30, BUN 13.   Hx of Stroke [facility]): -Crestor ASA and crestor   Polysubstance abuse:  uds + opioids and cocaine Alcohol abuse: -CIWA protocol     Code Status: full  Family Communication: patient   Disposition  Plan:  med tele bed home with home health in 1-2 days ( he currently declined HH) Is bp and blood glucose stable   Consultants:  none  Procedures:  none  Antibiotics:  none   Objective: BP (!) 82/67 (BP Location: Right Arm)   Pulse 80   Temp (!) 97.5 F (36.4 C) (Oral)   Resp 20   Ht 5\' 11"  (1.803 m)   Wt 127.1  kg (280 lb 1.6 oz)   SpO2 100%   BMI 39.07 kg/m   Intake/Output Summary (Last 24 hours) at 05/29/2017 0833 Last data filed at 05/29/2017 0451 Gross per 24 hour  Intake 1690 ml  Output 4425 ml  Net -2735 ml   Filed Weights   05/28/17 0505 05/29/17 0449  Weight: 126.1 kg (278 lb) 127.1 kg (280 lb 1.6 oz)    Exam: Patient is examined daily including today on 05/29/2017, exams remain the same as of yesterday except that has changed    General:  AAox3  Cardiovascular: RRR  Respiratory: CTABL  Abdomen: Soft/ND/NT, positive BS  Musculoskeletal: No Edema, chronic venous stasis changes, skin is dark and hard  Neuro: AAOx3, no focal weakness  Data Reviewed: Basic Metabolic Panel: Recent Labs  Lab 05/28/17 0335 05/28/17 0545 05/28/17 0816 05/28/17 1426 05/29/17 0506  NA 141 127* 140 136 135  K 3.8 2.8* 4.2 5.0 3.8  CL 109 101 107 103 100*  CO2 23 19* 25 27 26   GLUCOSE 159* 1,006* 144* 374* 206*  BUN 9 7 10 9 11   CREATININE 0.99 0.91 1.10 1.17 1.08  CALCIUM 9.0 6.7* 8.9 9.0 9.3  MG  --   --   --  1.8  --    Liver Function Tests: Recent Labs  Lab 05/27/17 1910  AST 33  ALT 45  ALKPHOS 98  BILITOT 0.9  PROT 7.0  ALBUMIN 4.0   No results for input(s): LIPASE, AMYLASE in the last 168 hours. No results for input(s): AMMONIA in the last 168 hours. CBC: Recent Labs  Lab 05/27/17 1910 05/27/17 1918 05/29/17 0506  WBC 6.3  --  5.7  NEUTROABS 3.2  --   --   HGB 15.5 16.3 14.0  HCT 46.2 48.0 43.7  MCV 90.9  --  91.8  PLT 175  --  152   Cardiac Enzymes:   Recent Labs  Lab 05/28/17 0001 05/28/17 0335 05/28/17 0816  TROPONINI 0.10* 0.11* 0.10*   BNP (last 3 results) Recent Labs    11/26/16 1740 05/13/17 0317 05/27/17 2140  BNP 20.7 38.0 67.8    ProBNP (last 3 results) No results for input(s): PROBNP in the last 8760 hours.  CBG: Recent Labs  Lab 05/28/17 1231 05/28/17 1720 05/28/17 2113 05/29/17 0509 05/29/17 0723  GLUCAP 207* 314* 325* 238*  183*    No results found for this or any previous visit (from the past 240 hour(s)).   Studies: No results found.  Scheduled Meds: . aspirin EC  81 mg Oral Daily  . carvedilol  3.125 mg Oral BID WC  . folic acid  1 mg Oral Daily  . insulin aspart  0-20 Units Subcutaneous TID WC  . insulin aspart  0-5 Units Subcutaneous QHS  . insulin aspart  6 Units Subcutaneous TID WC  . insulin glargine  25 Units Subcutaneous BID  . LORazepam  0-4 mg Intravenous Q6H   Followed by  . [START ON 05/30/2017] LORazepam  0-4 mg Intravenous Q12H  . multivitamin with minerals  1  tablet Oral Daily  . QUEtiapine  800 mg Oral QHS  . rivaroxaban  20 mg Oral Daily  . sacubitril-valsartan  1 tablet Oral BID  . thiamine  100 mg Oral Daily   Or  . thiamine  100 mg Intravenous Daily    Continuous Infusions:    Time spent: 25 mins I have personally reviewed and interpreted on  05/29/2017 daily labs, tele strips, imagings as discussed above under date review session and assessment and plans.  I reviewed all nursing notes, pharmacy notes, consultant notes,  vitals, pertinent old records  I have discussed plan of care as described above with RN , patient  on 05/29/2017   Florencia Reasons MD, PhD  [facility] Pager [phone]. If 7PM-7AM, please contact night-coverage at [email or web address], password [facility] 05/29/2017, 8:33 AM  LOS: 0 days

## 2017-05-29 NOTE — Progress Notes (Signed)
Pt complained he didn't feel well and asked for his blood sugar to be checked. Blood  Sugar is 238. Pt stated he feels lightheaded. Checked blood pressure that is 74/51. Paged on call provider that ordered 500 cc bolus. Will continue to monitor.

## 2017-05-30 ENCOUNTER — Observation Stay (HOSPITAL_COMMUNITY): Payer: Medicare Other

## 2017-05-30 DIAGNOSIS — G9341 Metabolic encephalopathy: Secondary | ICD-10-CM | POA: Diagnosis not present

## 2017-05-30 DIAGNOSIS — I959 Hypotension, unspecified: Secondary | ICD-10-CM

## 2017-05-30 DIAGNOSIS — E0810 Diabetes mellitus due to underlying condition with ketoacidosis without coma: Secondary | ICD-10-CM | POA: Diagnosis not present

## 2017-05-30 DIAGNOSIS — I5042 Chronic combined systolic (congestive) and diastolic (congestive) heart failure: Secondary | ICD-10-CM | POA: Diagnosis not present

## 2017-05-30 DIAGNOSIS — I480 Paroxysmal atrial fibrillation: Secondary | ICD-10-CM | POA: Diagnosis not present

## 2017-05-30 LAB — BASIC METABOLIC PANEL
Anion gap: 7 (ref 5–15)
BUN: 10 mg/dL (ref 6–20)
CO2: 25 mmol/L (ref 22–32)
CREATININE: 1.06 mg/dL (ref 0.61–1.24)
Calcium: 9.3 mg/dL (ref 8.9–10.3)
Chloride: 102 mmol/L (ref 101–111)
GFR calc Af Amer: >60 mL/min (ref >60)
GFR calc non Af Amer: >60 mL/min (ref >60)
Glucose, Bld: 301 mg/dL — ABNORMAL HIGH (ref 65–99)
POTASSIUM: 4 mmol/L (ref 3.5–5.1)
Sodium: 134 mmol/L — ABNORMAL LOW (ref 135–145)

## 2017-05-30 LAB — GLUCOSE, CAPILLARY
GLUCOSE-CAPILLARY: 233 mg/dL — AB (ref 65–99)
GLUCOSE-CAPILLARY: 291 mg/dL — AB (ref 65–99)
Glucose-Capillary: 242 mg/dL — ABNORMAL HIGH (ref 65–99)
Glucose-Capillary: 212 mg/dL — ABNORMAL HIGH (ref 65–99)

## 2017-05-30 LAB — CORTISOL: Cortisol, Plasma: 1.1 ug/dL

## 2017-05-30 LAB — MAGNESIUM: Magnesium: 1.8 mg/dL (ref 1.7–2.4)

## 2017-05-30 MED ORDER — COSYNTROPIN 0.25 MG IJ SOLR
0.2500 mg | Freq: Once | INTRAMUSCULAR | Status: AC
Start: 2017-05-31 — End: 2017-05-31
  Administered 2017-05-31: 0.25 mg via INTRAVENOUS
  Filled 2017-05-30: qty 0.25

## 2017-05-30 NOTE — Progress Notes (Signed)
Patient's BP 111/91 at 1401.  Spoke to Dr Erlinda Hong in regards to TED hose placement and concern over bilateral scaly lower extremities.  She stated to dc order for TED hose.

## 2017-05-30 NOTE — Progress Notes (Signed)
PROGRESS NOTE  [name] [name] MHD:[id number] DOB: [birth year] DOA: 05/27/2017 PCP: Rogers Blocker, MD  Brief summary:  Per ED intake on 5/2, Pt at 1837 family noticed pt was leaning to the right with new onset of AMS. Ems states that pt was weaker on the right side had a TIA about two weeks ago. Pt was axox2 per EMS. Pt upon arrival to ED was axox4 with no deficits at this time.   Acute stroke activated in the ED, per neurology , likely toxic metabolic encephalopathy, not recommend further brain imaging as his exam is completely nonfocal. No stroke w/u indicated at this time.     HPI/Recap of past 24 hours:  sbp remain low in the 70's and 80's, am cortisol level is very low at 1. He reports has been feeling dizzy standing up for the last three weeks. Am blood glucose in 300's  Denies chest pain, no sob, no fever, no edema   Assessment/Plan: Principal Problem:   DKA (diabetic ketoacidoses) (HCC) Active Problems:   HTN (hypertension)   Alcohol abuse   Paroxysmal atrial fibrillation (HCC)   Chronic combined systolic (congestive) and diastolic (congestive) heart failure (HCC)   Elevated troponin   Acute metabolic encephalopathy   CKD (chronic kidney disease), stage III (HCC)   Stroke ([place])  Acute metabolic encephalopathy:  -Mental status has improved to the baseline.  Currently oriented x3.  - Patient had questionable right-sided weakness.  CT head with no acute intracranial abnormalities.  Neurology was consulted, Dr. Rory Percy did not think this is stroke, and did not recommend further stroke work-up.  Per Dr. Rory Percy it is most likely toxic metabolic encephalopathy.  -he has mild DKA with elevated blood glucose at 472 on presentation -UDS + opioids and cocaine -ua no infection, cxr no pna. -improving  DKA (diabetic ketoacidoses) [facility]): Patient has elevated anion gap of 16, bicarbonate 19, blood sugar 472, indicating possible mild DKA.   He is started on insulin drip, gap down to  6, he is transitioned off insulin drip, started on subQinsulin.  Insulin dependent dm2,  Recent a1c 14.7 -he reports he has highs in the 400's and lows down to the 20's at home, lantus dose splits into 25units bid to hopefully prevent extreme high's or extreme lows -diabetes /diet education   H/o hypertension, now hypotension -hold all diuretics, coreg/entresto with parameters Am random cortisol level is very low, will get cosyntropin test -will get ct ab w/wo contrast to eval for adrenal leisons   Paroxysmal Atrial Fibrillation:  I have personally reviewed EKD and tele strips, it showed sinus rhythm with first degree av block with frequent pvc's CHA2DS2-VASc Score is 6, needs oral anticoagulation. Patient is on Xarelto at home. -continue Xarelto and coreg   Chronic combined systolic (congestive) and diastolic (congestive) heart failure ([place]):  -2D echo on 05/13/2017 showed EF of 30-35% with grade 1 diastolic dysfunction.  Patient does not have leg edema or JVD.  No respiratory distress.  -cxr with cardiomegaly, mild vascular congestion, -he received hydration for dka , will d/c ivf, resume diuretics -  coreg, Entresto with parameters, has not been getting it in the last 24hrs due to hypotension Hold  spironolactone, torsamide today due to hypotenstion -close monitor volume status  Chronically Elevated troponin: Troponin is chronically elevated, recent baseline 0.08-0.10.  His POC trop 0.12 and then trop 0.10. No CP.  at  baseline.  --on ASA, crestor and coreg  CKD (chronic kidney disease), stage II: stable. Baseline  creatinine 1.0-1.3.  His creatinine is 1.30, BUN 13.   Hx of Stroke [facility]): -reports has affected left side ,but recovered, no focal weakness now -Crestor ASA and crestor   Polysubstance abuse:  uds + opioids and cocaine  Alcohol abuse: -he report drink wine once a month, not sure if reliable -CIWA protocol  Obesity: Body mass index is 38.88  kg/m.   Code Status: full  Family Communication: patient   Disposition Plan:  med tele bed home with home health in 1-2 days ( he currently declined HH) needs bp and blood glucose stable   Consultants:  none  Procedures:  none  Antibiotics:  none   Objective: BP (!) 83/61 (BP Location: Right Arm)   Pulse 80   Temp 98.2 F (36.8 C) (Oral)   Resp 19   Ht 5\' 11"  (1.803 m)   Wt 126.5 kg (278 lb 12.8 oz)   SpO2 (!) 85%   BMI 38.88 kg/m   Intake/Output Summary (Last 24 hours) at 05/30/2017 0858 Last data filed at 05/30/2017 0216 Gross per 24 hour  Intake 720 ml  Output 650 ml  Net 70 ml   Filed Weights   05/28/17 0505 05/29/17 0449 05/30/17 0505  Weight: 126.1 kg (278 lb) 127.1 kg (280 lb 1.6 oz) 126.5 kg (278 lb 12.8 oz)    Exam: Patient is examined daily including today on 05/30/2017, exams remain the same as of yesterday except that has changed    General:  AAox3  Cardiovascular: RRR  Respiratory: CTABL  Abdomen: Soft/ND/NT, positive BS  Musculoskeletal: No Edema, chronic venous stasis changes, skin is dark and hard  Neuro: AAOx3, no focal weakness  Data Reviewed: Basic Metabolic Panel: Recent Labs  Lab 05/28/17 0545 05/28/17 0816 05/28/17 1426 05/29/17 0506 05/30/17 0331  NA 127* 140 136 135 134*  K 2.8* 4.2 5.0 3.8 4.0  CL 101 107 103 100* 102  CO2 19* 25 27 26 25   GLUCOSE 1,006* 144* 374* 206* 301*  BUN 7 10 9 11 10   CREATININE 0.91 1.10 1.17 1.08 1.06  CALCIUM 6.7* 8.9 9.0 9.3 9.3  MG  --   --  1.8  --  1.8   Liver Function Tests: Recent Labs  Lab 05/27/17 1910  AST 33  ALT 45  ALKPHOS 98  BILITOT 0.9  PROT 7.0  ALBUMIN 4.0   No results for input(s): LIPASE, AMYLASE in the last 168 hours. No results for input(s): AMMONIA in the last 168 hours. CBC: Recent Labs  Lab 05/27/17 1910 05/27/17 1918 05/29/17 0506  WBC 6.3  --  5.7  NEUTROABS 3.2  --   --   HGB 15.5 16.3 14.0  HCT 46.2 48.0 43.7  MCV 90.9  --  91.8  PLT  175  --  152   Cardiac Enzymes:   Recent Labs  Lab 05/28/17 0001 05/28/17 0335 05/28/17 0816  TROPONINI 0.10* 0.11* 0.10*   BNP (last 3 results) Recent Labs    11/26/16 1740 05/13/17 0317 05/27/17 2140  BNP 20.7 38.0 67.8    ProBNP (last 3 results) No results for input(s): PROBNP in the last 8760 hours.  CBG: Recent Labs  Lab 05/29/17 0723 05/29/17 1142 05/29/17 1645 05/29/17 2108 05/30/17 0807  GLUCAP 183* 252* 237* 334* 242*    Recent Results (from the past 240 hour(s))  Culture, blood (Routine X 2) w Reflex to ID Panel     Status: None (Preliminary result)   Collection Time: 05/27/17  9:40 PM  Result Value Ref Range Status   Specimen Description BLOOD RIGHT WRIST  Final   Special Requests   Final    BOTTLES DRAWN AEROBIC AND ANAEROBIC Blood Culture results may not be optimal due to an excessive volume of blood received in culture bottles   Culture   Final    NO GROWTH 1 DAY Performed at [facility] [address] [place], [place] [address]    Report Status PENDING  Incomplete  Culture, blood (Routine X 2) w Reflex to ID Panel     Status: None (Preliminary result)   Collection Time: 05/27/17  9:44 PM  Result Value Ref Range Status   Specimen Description BLOOD LEFT ANTECUBITAL  Final   Special Requests   Final    BOTTLES DRAWN AEROBIC AND ANAEROBIC Blood Culture adequate volume   Culture   Final    NO GROWTH 1 DAY Performed at [facility], [place] [address], [place], [place] [address]    Report Status PENDING  Incomplete     Studies: No results found.  Scheduled Meds: . aspirin EC  81 mg Oral Daily  . carvedilol  3.125 mg Oral BID WC  . [START ON 05/31/2017] cosyntropin  0.25 mg Intravenous Once  . folic acid  1 mg Oral Daily  . insulin aspart  0-20 Units Subcutaneous TID WC  . insulin aspart  0-5 Units Subcutaneous QHS  . insulin aspart  6 Units Subcutaneous TID WC  . insulin glargine  25 Units Subcutaneous BID  . LORazepam  0-4 mg  Intravenous Q12H  . multivitamin with minerals  1 tablet Oral Daily  . QUEtiapine  800 mg Oral QHS  . rivaroxaban  20 mg Oral Daily  . sacubitril-valsartan  1 tablet Oral BID  . thiamine  100 mg Oral Daily   Or  . thiamine  100 mg Intravenous Daily    Continuous Infusions:    Time spent: 25 mins I have personally reviewed and interpreted on  05/30/2017 daily labs, tele strips, imagings as discussed above under date review session and assessment and plans.  I reviewed all nursing notes, pharmacy notes,   vitals, pertinent old records  I have discussed plan of care as described above with RN , patient  on 05/30/2017   Florencia Reasons MD, PhD  [facility] Pager [phone]. If 7PM-7AM, please contact night-coverage at [email or web address], password [facility] 05/30/2017, 8:58 AM  LOS: 0 days

## 2017-05-31 ENCOUNTER — Observation Stay (HOSPITAL_BASED_OUTPATIENT_CLINIC_OR_DEPARTMENT_OTHER): Payer: Medicare Other

## 2017-05-31 DIAGNOSIS — I361 Nonrheumatic tricuspid (valve) insufficiency: Secondary | ICD-10-CM

## 2017-05-31 DIAGNOSIS — E1122 Type 2 diabetes mellitus with diabetic chronic kidney disease: Secondary | ICD-10-CM | POA: Diagnosis present

## 2017-05-31 DIAGNOSIS — I959 Hypotension, unspecified: Secondary | ICD-10-CM | POA: Diagnosis present

## 2017-05-31 DIAGNOSIS — N183 Chronic kidney disease, stage 3 (moderate): Secondary | ICD-10-CM | POA: Diagnosis not present

## 2017-05-31 DIAGNOSIS — G8191 Hemiplegia, unspecified affecting right dominant side: Secondary | ICD-10-CM | POA: Diagnosis present

## 2017-05-31 DIAGNOSIS — I429 Cardiomyopathy, unspecified: Secondary | ICD-10-CM | POA: Diagnosis present

## 2017-05-31 DIAGNOSIS — I493 Ventricular premature depolarization: Secondary | ICD-10-CM | POA: Diagnosis present

## 2017-05-31 DIAGNOSIS — F209 Schizophrenia, unspecified: Secondary | ICD-10-CM | POA: Diagnosis present

## 2017-05-31 DIAGNOSIS — I159 Secondary hypertension, unspecified: Secondary | ICD-10-CM | POA: Diagnosis not present

## 2017-05-31 DIAGNOSIS — G92 Toxic encephalopathy: Secondary | ICD-10-CM | POA: Diagnosis present

## 2017-05-31 DIAGNOSIS — I249 Acute ischemic heart disease, unspecified: Secondary | ICD-10-CM | POA: Diagnosis present

## 2017-05-31 DIAGNOSIS — I5042 Chronic combined systolic (congestive) and diastolic (congestive) heart failure: Secondary | ICD-10-CM

## 2017-05-31 DIAGNOSIS — M48061 Spinal stenosis, lumbar region without neurogenic claudication: Secondary | ICD-10-CM | POA: Diagnosis present

## 2017-05-31 DIAGNOSIS — K760 Fatty (change of) liver, not elsewhere classified: Secondary | ICD-10-CM | POA: Diagnosis present

## 2017-05-31 DIAGNOSIS — I480 Paroxysmal atrial fibrillation: Secondary | ICD-10-CM

## 2017-05-31 DIAGNOSIS — E785 Hyperlipidemia, unspecified: Secondary | ICD-10-CM | POA: Diagnosis present

## 2017-05-31 DIAGNOSIS — R7989 Other specified abnormal findings of blood chemistry: Secondary | ICD-10-CM | POA: Diagnosis present

## 2017-05-31 DIAGNOSIS — E669 Obesity, unspecified: Secondary | ICD-10-CM | POA: Diagnosis present

## 2017-05-31 DIAGNOSIS — F1010 Alcohol abuse, uncomplicated: Secondary | ICD-10-CM | POA: Diagnosis not present

## 2017-05-31 DIAGNOSIS — I440 Atrioventricular block, first degree: Secondary | ICD-10-CM | POA: Diagnosis present

## 2017-05-31 DIAGNOSIS — I950 Idiopathic hypotension: Secondary | ICD-10-CM | POA: Diagnosis not present

## 2017-05-31 DIAGNOSIS — E0810 Diabetes mellitus due to underlying condition with ketoacidosis without coma: Secondary | ICD-10-CM | POA: Diagnosis not present

## 2017-05-31 DIAGNOSIS — F329 Major depressive disorder, single episode, unspecified: Secondary | ICD-10-CM | POA: Diagnosis present

## 2017-05-31 DIAGNOSIS — M549 Dorsalgia, unspecified: Secondary | ICD-10-CM | POA: Diagnosis present

## 2017-05-31 DIAGNOSIS — G8929 Other chronic pain: Secondary | ICD-10-CM | POA: Diagnosis present

## 2017-05-31 DIAGNOSIS — I130 Hypertensive heart and chronic kidney disease with heart failure and stage 1 through stage 4 chronic kidney disease, or unspecified chronic kidney disease: Secondary | ICD-10-CM | POA: Diagnosis present

## 2017-05-31 DIAGNOSIS — G9341 Metabolic encephalopathy: Secondary | ICD-10-CM | POA: Diagnosis not present

## 2017-05-31 DIAGNOSIS — N179 Acute kidney failure, unspecified: Secondary | ICD-10-CM | POA: Diagnosis not present

## 2017-05-31 DIAGNOSIS — E1110 Type 2 diabetes mellitus with ketoacidosis without coma: Secondary | ICD-10-CM | POA: Diagnosis present

## 2017-05-31 LAB — BASIC METABOLIC PANEL
Anion gap: 8 (ref 5–15)
BUN: 10 mg/dL (ref 6–20)
CALCIUM: 9.5 mg/dL (ref 8.9–10.3)
CHLORIDE: 104 mmol/L (ref 101–111)
CO2: 26 mmol/L (ref 22–32)
CREATININE: 1 mg/dL (ref 0.61–1.24)
GFR calc non Af Amer: >60 mL/min (ref >60)
Glucose, Bld: 226 mg/dL — ABNORMAL HIGH (ref 65–99)
Potassium: 4.5 mmol/L (ref 3.5–5.1)
SODIUM: 138 mmol/L (ref 135–145)

## 2017-05-31 LAB — ECHOCARDIOGRAM LIMITED
Height: 71 in
Weight: 4518.4 oz

## 2017-05-31 LAB — ACTH STIMULATION, 3 TIME POINTS
CORTISOL 30 MIN: 12.1 ug/dL
CORTISOL BASE: 4.2 ug/dL
Cortisol, 60 Min: 15.6 ug/dL

## 2017-05-31 LAB — GLUCOSE, CAPILLARY
GLUCOSE-CAPILLARY: 266 mg/dL — AB (ref 65–99)
GLUCOSE-CAPILLARY: 306 mg/dL — AB (ref 65–99)
Glucose-Capillary: 281 mg/dL — ABNORMAL HIGH (ref 65–99)
Glucose-Capillary: 206 mg/dL — ABNORMAL HIGH (ref 65–99)

## 2017-05-31 LAB — MAGNESIUM: MAGNESIUM: 2 mg/dL (ref 1.7–2.4)

## 2017-05-31 MED ORDER — SACUBITRIL-VALSARTAN 49-51 MG PO TABS: 1.0000 | ORAL_TABLET | Freq: Two times a day (BID) | ORAL | Status: DC

## 2017-05-31 MED ORDER — INSULIN GLARGINE 100 UNIT/ML ~~LOC~~ SOLN
28.0000 [IU] | Freq: Two times a day (BID) | SUBCUTANEOUS | Status: DC
  Administered 2017-05-31 – 2017-06-01 (×3): 28 [IU] via SUBCUTANEOUS
  Filled 2017-05-31 (×4): qty 0.28

## 2017-05-31 MED ORDER — SPIRONOLACTONE 12.5 MG HALF TABLET
12.5000 mg | ORAL_TABLET | Freq: Every day | ORAL | Status: DC
  Administered 2017-05-31 – 2017-06-02 (×3): 12.5 mg via ORAL
  Filled 2017-05-31 (×4): qty 1

## 2017-05-31 MED ORDER — SACUBITRIL-VALSARTAN 24-26 MG PO TABS
1.0000 | ORAL_TABLET | Freq: Two times a day (BID) | ORAL | Status: DC
  Filled 2017-05-31: qty 1

## 2017-05-31 MED ORDER — ROSUVASTATIN CALCIUM 20 MG PO TABS
40.0000 mg | ORAL_TABLET | Freq: Every day | ORAL | Status: DC
  Administered 2017-05-31 – 2017-06-03 (×4): 40 mg via ORAL
  Filled 2017-05-31 (×4): qty 2

## 2017-05-31 NOTE — Progress Notes (Signed)
Inpatient Diabetes Program Recommendations  AACE/ADA: New Consensus Statement on Inpatient Glycemic Control (2015)  Target Ranges:  Prepandial:   less than 140 mg/dL      Peak postprandial:   less than 180 mg/dL (1-2 hours)      Critically ill patients:  140 - 180 mg/dL   Lab Results  Component Value Date   GLUCAP 306 (H) 05/31/2017   HGBA1C 14.7 (H) 05/13/2017    Review of Glycemic ControlResults for TRAVELLE, MCCLIMANS (MRN [id number]) as of 05/31/2017 14:10  Ref. Range 05/30/2017 16:14 05/30/2017 21:25 05/31/2017 07:39 05/31/2017 11:16  Glucose-Capillary Latest Ref Range: 65 - 99 mg/dL 212 (H) 291 (H) 266 (H) 306 (H)   Diabetes history: DM2 Outpatient Diabetes medications: Lantus 50 units QHS, Novolin R 18 units tidwc, metformin 1000 mg bid, Current orders for Inpatient glycemic control:  Lantus 28 units bid, Novolog resistant tid with meals and HS, Novolog 6 units tid with meals  Inpatient Diabetes Program Recommendations:   Note increase in blood sugars after lunch.  May consider increasing Novolog to 10 units tid with meals (hold if patient eats less than 50%).   Thanks,  Adah Perl, RN, BC-ADM Inpatient Diabetes Coordinator Pager [phone] (8a-5p)

## 2017-05-31 NOTE — Progress Notes (Signed)
  Echocardiogram 2D Echocardiogram has been performed.  Jannett Celestine 05/31/2017, 12:58 PM

## 2017-05-31 NOTE — Progress Notes (Signed)
PROGRESS NOTE  [name] [name]:[id number] DOB: 25-Sep-1955 DOA: 05/27/2017 PCP: Rogers Blocker, MD  Brief summary:  Per ED intake on 5/2, Pt at 1837 family noticed pt was leaning to the right with new onset of AMS. Ems states that pt was weaker on the right side had a TIA about two weeks ago. Pt was axox2 per EMS. Pt upon arrival to ED was axox4 with no deficits at this time.   Acute stroke activated in the ED, per neurology , likely toxic metabolic encephalopathy, not recommend further brain imaging as his exam is completely nonfocal. No stroke w/u indicated at this time.     HPI/Recap of past 24 hours:  sbp improved but remain low in the 80's, He has an appropriate Cosyntropin test response   He reports has been feeling dizzy standing up for the last three weeks. Am blood glucose 226  Denies chest pain, no sob, no fever, no edema   Assessment/Plan: Principal Problem:   DKA (diabetic ketoacidoses) (HCC) Active Problems:   HTN (hypertension)   Alcohol abuse   Paroxysmal atrial fibrillation (HCC)   Chronic combined systolic (congestive) and diastolic (congestive) heart failure (HCC)   Elevated troponin   Acute metabolic encephalopathy   CKD (chronic kidney disease), stage III (HCC)   Stroke (HCC)  Acute metabolic encephalopathy:  (persenting symptom) -Mental status has improved to the baseline.  Currently oriented x3.  - Patient had questionable right-sided weakness.  CT head with no acute intracranial abnormalities.  Neurology was consulted, Dr. Rory Percy did not think this is stroke, and did not recommend further stroke work-up.  Per Dr. Rory Percy it is most likely toxic metabolic encephalopathy.  -he has mild DKA with elevated blood glucose at 472 on presentation -UDS + opioids and cocaine -ua no infection, cxr no pna. -mental status has much improved  DKA (diabetic ketoacidoses) [facility]): Patient has elevated anion gap of 16, bicarbonate 19, blood sugar 472, indicating  possible mild DKA.   He is started on insulin drip, gap down to 6, he is transitioned off insulin drip,  Resolved, now back on  subQinsulin.  Insulin dependent dm2,  Recent a1c 14.7 -he reports he has highs in the 400's and lows down to the 20's at home, - lantus dose splits into 25units bid to hopefully prevent extreme high's or extreme lows, am blood glucose remain elevated, will increase lantus to 28units bid, continue meal coverage, and ssi -diabetes /diet education   H/o hypertension, now hypotension -hold all diuretics,  -continue coreg/entresto with parameters -Am random cortisol level is very low,  cosyntropin test response is appropriate - ct ab w/wo contrast no adrenal leisons -will repeat echocardiogram, consult cardiology for cardiac meds adjustment   Paroxysmal Atrial Fibrillation:  I have personally reviewed EKD and tele strips, it showed sinus rhythm with first degree av block with frequent pvc's CHA2DS2-VASc Score is 6, needs oral anticoagulation. Patient is on Xarelto at home. -continue Xarelto and coreg   Chronic combined systolic (congestive) and diastolic (congestive) heart failure ([place]):  -2D echo on 05/13/2017 showed EF of 30-35% with grade 1 diastolic dysfunction.  Patient does not have leg edema or JVD.  No respiratory distress.  -cxr with cardiomegaly, mild vascular congestion, -he received hydration for dka , will d/c ivf,  -coreg, Entresto with parameters, has not been getting it in the last 24hrs due to hypotension -spironolactone, torsamide held since 5/5 due to hypotenstion -close monitor volume status -cardiology consulted for meds adjustment  Chronically  Elevated troponin: Troponin is chronically elevated, recent baseline 0.08-0.10.  His POC trop 0.12 and then trop 0.10. No CP.  at  baseline.  --on ASA, crestor and coreg  CKD (chronic kidney disease), stage II: stable. Baseline creatinine 1.0-1.3.  His creatinine is 1.30, BUN 13.   Hx of  Stroke [facility]): -reports has affected left side ,but recovered, no focal weakness now -Crestor ASA and crestor   Polysubstance abuse:  uds + opioids and cocaine  Alcohol abuse: -he reports drink wine once a month, not sure if reliable -CIWA protocol  Obesity: Body mass index is 39.39 kg/m.   Code Status: full  Family Communication: patient   Disposition Plan:  med tele bed home with home health in 1-2 days ( he declined HH yesterday, today I discussed with him, he agreed to it, will order [facility]) needs bp and blood glucose stable Need cardiology clearance   Consultants:  cardiology  Procedures:  none  Antibiotics:  none   Objective: BP 93/68 (BP Location: Right Arm)   Pulse 66   Temp (!) 97.5 F (36.4 C) (Oral)   Resp (!) 23   Ht 5\' 11"  (1.803 m)   Wt 128.1 kg (282 lb 6.4 oz)   SpO2 95%   BMI 39.39 kg/m   Intake/Output Summary (Last 24 hours) at 05/31/2017 0752 Last data filed at 05/31/2017 0648 Gross per 24 hour  Intake 780 ml  Output 775 ml  Net 5 ml   Filed Weights   05/29/17 0449 05/30/17 0505 05/31/17 0435  Weight: 127.1 kg (280 lb 1.6 oz) 126.5 kg (278 lb 12.8 oz) 128.1 kg (282 lb 6.4 oz)    Exam: Patient is examined daily including today on 05/31/2017, exams remain the same as of yesterday except that has changed    General:  AAox3  Cardiovascular: RRR  Respiratory: CTABL  Abdomen: Soft/ND/NT, positive BS  Musculoskeletal: No Edema, chronic venous stasis changes, skin is dark and hard  Neuro: AAOx3, no focal weakness  Data Reviewed: Basic Metabolic Panel: Recent Labs  Lab 05/28/17 0816 05/28/17 1426 05/29/17 0506 05/30/17 0331 05/31/17 0522  NA 140 136 135 134* 138  K 4.2 5.0 3.8 4.0 4.5  CL 107 103 100* 102 104  CO2 25 27 26 25 26   GLUCOSE 144* 374* 206* 301* 226*  BUN 10 9 11 10 10   CREATININE 1.10 1.17 1.08 1.06 1.00  CALCIUM 8.9 9.0 9.3 9.3 9.5  MG  --  1.8  --  1.8 2.0   Liver Function Tests: Recent Labs  Lab  05/27/17 1910  AST 33  ALT 45  ALKPHOS 98  BILITOT 0.9  PROT 7.0  ALBUMIN 4.0   No results for input(s): LIPASE, AMYLASE in the last 168 hours. No results for input(s): AMMONIA in the last 168 hours. CBC: Recent Labs  Lab 05/27/17 1910 05/27/17 1918 05/29/17 0506  WBC 6.3  --  5.7  NEUTROABS 3.2  --   --   HGB 15.5 16.3 14.0  HCT 46.2 48.0 43.7  MCV 90.9  --  91.8  PLT 175  --  152   Cardiac Enzymes:   Recent Labs  Lab 05/28/17 0001 05/28/17 0335 05/28/17 0816  TROPONINI 0.10* 0.11* 0.10*   BNP (last 3 results) Recent Labs    11/26/16 1740 05/13/17 0317 05/27/17 2140  BNP 20.7 38.0 67.8    ProBNP (last 3 results) No results for input(s): PROBNP in the last 8760 hours.  CBG: Recent Labs  Lab 05/30/17  0174 05/30/17 1149 05/30/17 1614 05/30/17 2125 05/31/17 0739  GLUCAP 242* 233* 212* 291* 266*    Recent Results (from the past 240 hour(s))  Culture, blood (Routine X 2) w Reflex to ID Panel     Status: None (Preliminary result)   Collection Time: 05/27/17  9:40 PM  Result Value Ref Range Status   Specimen Description BLOOD RIGHT WRIST  Final   Special Requests   Final    BOTTLES DRAWN AEROBIC AND ANAEROBIC Blood Culture results may not be optimal due to an excessive volume of blood received in culture bottles   Culture   Final    NO GROWTH 2 DAYS Performed at [facility], [place] [address], [place], [place] [address]    Report Status PENDING  Incomplete  Culture, blood (Routine X 2) w Reflex to ID Panel     Status: None (Preliminary result)   Collection Time: 05/27/17  9:44 PM  Result Value Ref Range Status   Specimen Description BLOOD LEFT ANTECUBITAL  Final   Special Requests   Final    BOTTLES DRAWN AEROBIC AND ANAEROBIC Blood Culture adequate volume   Culture   Final    NO GROWTH 2 DAYS Performed at [facility], [place] [address], [place], [place] [address]    Report Status PENDING  Incomplete     Studies: Ct Adrenal Abdomen Wo  Contrast  Result Date: 05/30/2017 CLINICAL DATA:  Inpatient. Low a.m. cortisol level. Hypotension. Clinical concern for adrenal neoplasm. EXAM: CT ABDOMEN WITHOUT CONTRAST TECHNIQUE: Multidetector CT imaging of the abdomen was performed following the standard protocol without IV contrast. COMPARISON:  12/19/2012 CT abdomen/pelvis. FINDINGS: Lower chest: Mild scarring versus atelectasis at the dependent left lung base. Hepatobiliary: Mild diffuse hepatic steatosis. No definite liver surface irregularity. No liver masses. Cholelithiasis. No biliary ductal dilatation. Pancreas: Normal, with no mass or duct dilation. Spleen: Multiple similar complex cystic splenic masses, partially calcified, largest 7.1 cm, unchanged since 12/19/2012 CT, considered benign. Overall normal size spleen. Adrenals/Urinary Tract: Normal adrenal glands, with no adrenal nodules. Punctate nonobstructing 1-2 mm upper left renal stone. No additional renal stones. No hydronephrosis. Simple 2.2 cm posterior upper left renal cyst. No additional contour deforming renal lesions. Stomach/Bowel: Normal non-distended stomach. Visualized small and large bowel is normal caliber, with no bowel wall thickening. Vascular/Lymphatic: Atherosclerotic nonaneurysmal abdominal aorta. No pathologically enlarged lymph nodes in the abdomen. Other: No pneumoperitoneum, ascites or focal fluid collection. Partially visualized moderate symmetric gynecomastia. Musculoskeletal: No aggressive appearing focal osseous lesions. Partially visualized bilateral posterior spinal fusion hardware at L3, L4 and L5. Marked degenerative disc disease in the lumbar spine. IMPRESSION: 1. Normal adrenal glands, with no adrenal mass. 2. No acute abnormality. 3. Mild diffuse hepatic steatosis. 4. Cholelithiasis. 5. Punctate nonobstructing upper left renal stone. 6. Moderate symmetric gynecomastia. 7.  Aortic Atherosclerosis (ICD10-I70.0). Electronically Signed   By: Ilona Sorrel M.D.   On:  05/30/2017 13:46    Scheduled Meds: . aspirin EC  81 mg Oral Daily  . carvedilol  3.125 mg Oral BID WC  . folic acid  1 mg Oral Daily  . insulin aspart  0-20 Units Subcutaneous TID WC  . insulin aspart  0-5 Units Subcutaneous QHS  . insulin aspart  6 Units Subcutaneous TID WC  . insulin glargine  28 Units Subcutaneous BID  . LORazepam  0-4 mg Intravenous Q12H  . multivitamin with minerals  1 tablet Oral Daily  . QUEtiapine  800 mg Oral QHS  .  rivaroxaban  20 mg Oral Daily  . sacubitril-valsartan  1 tablet Oral BID  . thiamine  100 mg Oral Daily   Or  . thiamine  100 mg Intravenous Daily    Continuous Infusions:    Time spent: 25 mins I have personally reviewed and interpreted on  05/31/2017 daily labs, tele strips, imagings as discussed above under date review session and assessment and plans.  I reviewed all nursing notes, pharmacy notes,  Consult note, vitals, pertinent old records  I have discussed plan of care as described above with RN , patient  on 05/31/2017   Florencia Reasons MD, PhD  [facility] Pager [phone]. If 7PM-7AM, please contact night-coverage at [email or web address], password [facility] 05/31/2017, 7:52 AM  LOS: 0 days

## 2017-05-31 NOTE — Progress Notes (Signed)
Physical Therapy Treatment Patient Details Name: [name] [name] MRN: [id number] DOB: 1955/10/08 Today's Date: 05/31/2017    History of Present Illness 62 yo male with onset of DKA, home alone with 3 hours a day personal care attendant.  Had confusion and gait changes with R side weakness prior to admission.  PMHx:  Substance abuse, pancreatitis, schizophrenia, CHF, CKD 3, cocaine use,  HTN, DM, stroke, depression    PT Comments    Pt making good progress. Pt to return home with intermittent support.    Follow Up Recommendations  Home health PT;Supervision - Intermittent(Pt declined HH)     Equipment Recommendations  Rolling walker with 5" wheels    Recommendations for Other Services       Precautions / Restrictions Precautions Precautions: Fall Restrictions Weight Bearing Restrictions: No    Mobility  Bed Mobility               General bed mobility comments: Up in chair  Transfers Overall transfer level: Modified independent Equipment used: Rolling walker (2 wheeled);1 person hand held assist Transfers: Sit to/from Stand Sit to Stand: Modified independent (Device/Increase time)         General transfer comment: Incr time to rise but no assist needed  Ambulation/Gait Ambulation/Gait assistance: Supervision(for safety) Ambulation Distance (Feet): 200 Feet Assistive device: Rolling walker (2 wheeled) Gait Pattern/deviations: Decreased stride length;Step-through pattern;Wide base of support;Trunk flexed Gait velocity: reduced Gait velocity interpretation: 1.31 - 2.62 ft/sec, indicative of limited community ambulator General Gait Details: supervision for lines. Amb on RA with SpO2 100%.   Stairs             Wheelchair Mobility    Modified Rankin (Stroke Patients Only)       Balance Overall balance assessment: Needs assistance Sitting-balance support: Feet supported Sitting balance-Leahy Scale: Good     Standing balance support: During  functional activity;No upper extremity supported Standing balance-Leahy Scale: Fair                              Cognition Arousal/Alertness: Awake/alert Behavior During Therapy: WFL for tasks assessed/performed Overall Cognitive Status: Within Functional Limits for tasks assessed                                        Exercises      General Comments        Pertinent Vitals/Pain Pain Assessment: No/denies pain    Home Living                      Prior Function            PT Goals (current goals can now be found in the care plan section) Progress towards PT goals: Progressing toward goals    Frequency    Min 3X/week      PT Plan Current plan remains appropriate    Co-evaluation              AM-PAC PT "6 Clicks" Daily Activity  Outcome Measure  Difficulty turning over in bed (including adjusting bedclothes, sheets and blankets)?: None Difficulty moving from lying on back to sitting on the side of the bed? : A Little Difficulty sitting down on and standing up from a chair with arms (e.g., wheelchair, bedside commode, etc,.)?: A Little Help needed moving to and from a  bed to chair (including a wheelchair)?: A Little Help needed walking in hospital room?: A Little Help needed climbing 3-5 steps with a railing? : A Little 6 Click Score: 19    End of Session   Activity Tolerance: Patient tolerated treatment well Patient left: in chair;with call bell/phone within reach Nurse Communication: Mobility status PT Visit Diagnosis: Unsteadiness on feet (R26.81);Muscle weakness (generalized) (M62.81);Adult, failure to thrive (R62.7)     Time: 4076-8088 PT Time Calculation (min) (ACUTE ONLY): 10 min  Charges:  $Gait Training: 8-22 mins                    G Codes:       [facility] [place] 05/31/2017, 9:57 AM

## 2017-05-31 NOTE — Consult Note (Addendum)
Advanced Heart Failure Team Consult Note   Primary Physician: Rogers Blocker, MD PCP-Cardiologist:  No primary care provider on file.  Reason for Consultation: AMS/ Hypotension in CHF patient.   HPI:    [name] [name] is seen today for evaluation of AMS/hypotension in CHF patient at the request of Dr. Erlinda Hong.   [name] [name] is a 62 y.o. male with h/o CVA 01/2012,  PFO (R/L shunt), Schizophrenia, NICM with LHC 04/2012 with normal cors, HTN, HLD, HIT, Hep C, hx of cocaine use, ETOH abuse, GERD, DMII, CKD stage II, Chronic combined CHF, prostate CA, and Afib.   Last seen by HF team during admission 68/1275 for A/C systolic CHF. Lasix held day of discharge with AKI. Non-compliant with scheduled follow up at discharge.   Pt was admitted to ED on 5/2 with AMS and R sided weakness. Of note, had TIA approx 2 weeks prior. Neurology has seen and CT head with no acute intracranial abnormalities. Thought to be acute encephalopathy in setting of DKA. Pertinent labs on admisison include glucose 472, Bicarb 19, Anion gap 16, Hgb A1c 14.7, WBC 6.3, INR 1.43, Trop 0.12, Cr 1.34, K 4.4. CHF meds held with hypotension.   PTA was on Coreg 6.25 BID, Entresto 97/103 mg BID, torsemide 40 mg q am and 20 mg qpm, and Spiro 12.5 mg daily.   Feeling much better today. Mild lightheadedness with hypotension. Denies SOB. He states he can't remember if he was lightheaded or dizzy prior to admission.  He had been BACK ON his meds for approx 10 days PTA. He states prior to this, he was off all meds for 3-4 weeks due to "switching doctors" and being unable to get his prescriptions filled. He was drinking "a lot of sodas".   Echo 05/13/17 LVEF 30-35%, Grade 1 DD, No estimate of PA pressure.   Review of systems complete and found to be negative unless listed in HPI.    Home Medications Prior to Admission medications   Medication Sig Start Date End Date Taking? Authorizing Provider  carvedilol (COREG) 6.25 MG tablet take  1 tablet by mouth twice a day with meals 04/30/16  Yes Bensimhon, Shaune Pascal, MD  Insulin Glargine (LANTUS SOLOSTAR) 100 UNIT/ML Solostar Pen Inject 50 Units into the skin daily at 10 pm. 05/14/17 06/13/17 Yes Adhikari, Tamsen Meek, MD  insulin regular (NOVOLIN R,HUMULIN R) 100 units/mL injection 3 times with meals.Take 30 minutes before meal. Patient taking differently: Inject 18 Units into the skin 3 (three) times daily before meals.  05/14/17 05/14/18 Yes Shelly Coss, MD  QUEtiapine (SEROQUEL) 400 MG tablet Take 800 mg by mouth at bedtime.    Yes [provider]  rivaroxaban (XARELTO) 20 MG TABS tablet Take 1 tablet (20 mg total) by mouth daily. 10/01/16  Yes Alphonzo Grieve, MD  sacubitril-valsartan (ENTRESTO) 97-103 MG Take 1 tablet by mouth 2 (two) times daily. 10/20/16  Yes Arbutus Leas, NP  torsemide (DEMADEX) 20 MG tablet Take 40 mg (2 tabs) in am and 20 mg (1 tab) in pm. Patient taking differently: Take 20-40 mg by mouth 2 (two) times daily. Take 40 mg (2 tabs) in the morning and 20 mg (1 tab) in the evening 10/20/16  Yes Arbutus Leas, NP  ACCU-CHEK FASTCLIX LANCETS MISC Check blood sugar 4 times a day before meals and bedtime 02/16/17   [name], MD  B-D UF III MINI PEN NEEDLES 31G X 5 MM MISC Use four times daily as directed.  Dx code E11.00, Z79.4 12/23/16   Aldine Contes, MD  Blood Glucose Monitoring Suppl (ACCU-CHEK GUIDE) w/Device KIT 1 each by Does not apply route 4 (four) times daily. 02/16/17   [name], MD  glucose blood (ACCU-CHEK GUIDE) test strip Check blood sugar 4 times a day before meal and bedtime 02/16/17   [name], MD  metFORMIN (GLUCOPHAGE) 1000 MG tablet Take 1 tablet (1,000 mg total) by mouth 2 (two) times daily with a meal. 12/30/15 05/12/17  Norman Herrlich, MD  rosuvastatin (CRESTOR) 40 MG tablet Take 1 tablet (40 mg total) by mouth daily. 05/15/17   Shelly Coss, MD  spironolactone (ALDACTONE) 25 MG tablet Take 0.5 tablets (12.5 mg total) by mouth  daily. 10/20/16 05/12/17  Arbutus Leas, NP    Past Medical History: Past Medical History:  Diagnosis Date  . Atrial fibrillation ([place])   . Cancer [facility])    Prostate cancer-bx. 3 weeks ago  . Cataract   . Chronic combined systolic and diastolic CHF (congestive heart failure) (HCC)    a) EF 40-45% per 2D echo (02/2012) with grade 1 DD b)  NICM c) RHC (04/2012): RA: 4, RV 45/3/4, PA 42/9 (24), PCWP 14, Fick CO/CI: 5.2 /2.2, PVR 1.9 WU, PA 62% and 64% d) ECHO (10/2012) EF 40-45%, grade II DD, RV nl  . CKD (chronic kidney disease) stage 2, GFR 60-89 ml/min    BL SCr approximately 1-1.3  . Colitis 05/2009   History of colitis of ascending colon noted on CT abd/pelvis (05/2009), with interval resolution with subsequent CT  . Continuous chronic alcoholism ([place])   . Degenerative lumbar spinal stenosis    s/p L2-3, L3-4, L4-5 laminectomy partial facetectomy, and bilateral foraminotomy  . Diabetes mellitus without complication ([place])    Type II  . Dysrhythmia    A. Fib  . Family history of adverse reaction to anesthesia    "sister, can't go to sleep"  . GERD (gastroesophageal reflux disease)   . H/O cocaine abuse   . Hepatic steatosis    suspected 2/2 alcohol abuse  . Hepatitis C   . History of pancreatitis 01/2011   Admission for acute pancreatitis presumed 2/2 ongoing alcohol abuse- and hypertriglyceridemia  . History of pneumonia   . HIT (heparin-induced thrombocytopenia) ([place])   . Hyperlipidemia   . Hypertension    uncontrolled with medication noncompliance  . Hypertriglyceridemia   . NICM (nonischemic cardiomyopathy) ([place])    a. LHC (04/2012): nl arteries  . PFO (patent foramen ovale) 01/2012   with right to left shunt, noted per TEE in evaluation for source of embolic stroke in 04/979  . Rhabdomyolysis 02/22/2012   H/O rhabdomyolysis in 01/2012 that was idiopathic, cause never identified  . Schizophrenia, schizo-affective ([place])   . Shortness of breath dyspnea   . Splenic cyst   .  Stroke ([place]) 01/2012   Small cerebellar infarcts right greater than left as well as questionable acute left external capsule and caudate nuclear punctate lacunar infarcts noted per MRI (01/2012) - presumed to be embolic likely source PFO with right to left shunt (noted per TEE 01/ 2014)    Past Surgical History: Past Surgical History:  Procedure Laterality Date  . ACHILLES TENDON REPAIR Right 2007   "it was torn"  . BACK SURGERY    . CARDIAC CATHETERIZATION N/A 09/02/2015   Procedure: Right Heart Cath;  Surgeon: Jolaine Artist, MD;  Location: [facility];  Service: Cardiovascular;  Laterality: N/A;  . CATARACT  EXTRACTION W/ INTRAOCULAR LENS IMPLANT Right   . ESOPHAGOGASTRODUODENOSCOPY N/A 06/25/2012   Procedure: ESOPHAGOGASTRODUODENOSCOPY (EGD);  Surgeon: Milus Banister, MD;  Location: [facility];  Service: Endoscopy;  Laterality: N/A;  . I&D EXTREMITY  03/20/2011   Procedure: IRRIGATION AND DEBRIDEMENT EXTREMITY;  Surgeon: Kerin Salen, MD;  Location: [facility];  Service: Orthopedics;  Laterality: Left;  I&D LEFT ACHILLIES TENDON  . KNEE ARTHROSCOPY Left   . LUMBAR LAMINECTOMY     L2-3, L3-4, L4-5 laminectomy, partial facetectomy  . RESECTION DISTAL CLAVICAL  09/17/2011   Procedure: RESECTION DISTAL CLAVICAL;  Surgeon: Nita Sells, MD;  Location: [facility];  Service: Orthopedics;  Laterality: Right;  right shoulder arthroscopy with sad and open distal clavicle excision   . ROBOT ASSISTED LAPAROSCOPIC RADICAL PROSTATECTOMY N/A 12/16/2012   Procedure: ROBOTIC ASSISTED LAPAROSCOPIC PROSTATECTOMY ;  Surgeon: Ardis Hughs, MD;  Location: [facility];  Service: Urology;  Laterality: N/A;  . TONSILLECTOMY      Family History: Family History  Problem Relation Age of Onset  . CAD Mother 22       deceased  . CAD Sister   . CAD Brother 68       died from MI at age 59yo  . Hypertension Unknown     Social History: Social History   Socioeconomic History    . Marital status: Single    Spouse name: Not on file  . Number of children: Not on file  . Years of education: 12th grade  . Highest education level: Not on file  Occupational History  . Occupation: Disability    Comment: 2/2 schizophrenia  Social Needs  . Financial resource strain: Not on file  . Food insecurity:    Worry: Not on file    Inability: Not on file  . Transportation needs:    Medical: Not on file    Non-medical: Not on file  Tobacco Use  . Smoking status: Former Smoker    Packs/day: 1.00    Years: 30.00    Pack years: 30.00    Types: Cigarettes    Last attempt to quit: 06/24/2001    Years since quitting: 15.9  . Smokeless tobacco: Never Used  Substance and Sexual Activity  . Alcohol use: Yes    Alcohol/week: 0.0 oz  . Drug use: Yes    Types: "Crack" cocaine    Comment: h/o cocaine abuse; last use about 2 months ago (10/01/16)  . Sexual activity: Yes  Lifestyle  . Physical activity:    Days per week: Not on file    Minutes per session: Not on file  . Stress: Not on file  Relationships  . Social connections:    Talks on phone: Not on file    Gets together: Not on file    Attends religious service: Not on file    Active member of club or organization: Not on file    Attends meetings of clubs or organizations: Not on file    Relationship status: Not on file  Other Topics Concern  . Not on file  Social History Narrative   Lives in [place] alone, has a [facility] aide that helps with medications 4-5 days a week with medications, helping to clean.    Allergies:  Allergies  Allergen Reactions  . Heparin Other (See Comments)    Documented HIT under problem list Pt states he's not allergic. " They told me not take it anymore"  . Thorazine [Chlorpromazine Hcl] Other (See Comments)  Body freezes up    Objective:    Vital Signs:   Temp:  [97.5 F (36.4 C)-98.5 F (36.9 C)] 97.5 F (36.4 C) (05/06 0435) Pulse Rate:  [66-98] 90 (05/06 0915) Resp:   [18-25] 18 (05/06 0915) BP: (72-111)/(49-91) 85/68 (05/06 0915) SpO2:  [95 %-100 %] 100 % (05/06 0915) Weight:  [282 lb 6.4 oz (128.1 kg)] 282 lb 6.4 oz (128.1 kg) (05/06 0435) Last BM Date: 05/27/17  Weight change: Filed Weights   05/29/17 0449 05/30/17 0505 05/31/17 0435  Weight: 280 lb 1.6 oz (127.1 kg) 278 lb 12.8 oz (126.5 kg) 282 lb 6.4 oz (128.1 kg)    Intake/Output:   Intake/Output Summary (Last 24 hours) at 05/31/2017 0919 Last data filed at 05/31/2017 0648 Gross per 24 hour  Intake 600 ml  Output 775 ml  Net -175 ml     Physical Exam    General:  Well appearing. No resp difficulty HEENT: normal Neck: supple. JVP ~7-8. Carotids 2+ bilat; no bruits. No lymphadenopathy or thyromegaly appreciated. Cor: PMI nondisplaced. Regular rate & rhythm. No rubs, gallops or murmurs. Lungs: clear Abdomen: soft, nontender, nondistended. No hepatosplenomegaly. No bruits or masses. Good bowel sounds. Extremities: no cyanosis, clubbing, rash, Trace ankle edema Neuro: alert & orientedx3, cranial nerves grossly intact. moves all 4 extremities w/o difficulty. Affect pleasant  Telemetry   NSR 70-80s, personally reviewed PVC burden 9-19 per minute.  EKG    Atrial tach on admission. Personally reviewed.   Labs   Basic Metabolic Panel: Recent Labs  Lab 05/28/17 0816 05/28/17 1426 05/29/17 0506 05/30/17 0331 05/31/17 0522  NA 140 136 135 134* 138  K 4.2 5.0 3.8 4.0 4.5  CL 107 103 100* 102 104  CO2 _0 GLUCOSE 144* 374* 206* 301* 226*  BUN _1 CREATININE 1.10 1.17 1.08 1.06 1.00  CALCIUM 8.9 9.0 9.3 9.3 9.5  MG  --  1.8  --  1.8 2.0    Liver Function Tests: Recent Labs  Lab 05/27/17 1910  AST 33  ALT 45  ALKPHOS 98  BILITOT 0.9  PROT 7.0  ALBUMIN 4.0   No results for input(s): LIPASE, AMYLASE in the last 168 hours. No results for input(s): AMMONIA in the last 168 hours.  CBC: Recent Labs  Lab 05/27/17 1910 05/27/17 1918 05/29/17 0506    WBC 6.3  --  5.7  NEUTROABS 3.2  --   --   HGB 15.5 16.3 14.0  HCT 46.2 48.0 43.7  MCV 90.9  --  91.8  PLT 175  --  152    Cardiac Enzymes: Recent Labs  Lab 05/28/17 0001 05/28/17 0335 05/28/17 0816  TROPONINI 0.10* 0.11* 0.10*    BNP: BNP (last 3 results) Recent Labs    11/26/16 1740 05/13/17 0317 05/27/17 2140  BNP 20.7 38.0 67.8    ProBNP (last 3 results) No results for input(s): PROBNP in the last 8760 hours.   CBG: Recent Labs  Lab 05/30/17 0807 05/30/17 1149 05/30/17 1614 05/30/17 2125 05/31/17 0739  GLUCAP 242* 233* 212* 291* 266*    Coagulation Studies: No results for input(s): LABPROT, INR in the last 72 hours.   Imaging   Ct Adrenal Abdomen Wo Contrast  Result Date: 05/30/2017 CLINICAL DATA:  Inpatient. Low a.m. cortisol level. Hypotension. Clinical concern for adrenal neoplasm. EXAM: CT ABDOMEN WITHOUT CONTRAST TECHNIQUE: Multidetector CT imaging of the abdomen was performed following the standard protocol without IV contrast. COMPARISON:  12/19/2012 CT abdomen/pelvis. FINDINGS: Lower chest: Mild scarring versus atelectasis at the dependent left lung base. Hepatobiliary: Mild diffuse hepatic steatosis. No definite liver surface irregularity. No liver masses. Cholelithiasis. No biliary ductal dilatation. Pancreas: Normal, with no mass or duct dilation. Spleen: Multiple similar complex cystic splenic masses, partially calcified, largest 7.1 cm, unchanged since 12/19/2012 CT, considered benign. Overall normal size spleen. Adrenals/Urinary Tract: Normal adrenal glands, with no adrenal nodules. Punctate nonobstructing 1-2 mm upper left renal stone. No additional renal stones. No hydronephrosis. Simple 2.2 cm posterior upper left renal cyst. No additional contour deforming renal lesions. Stomach/Bowel: Normal non-distended stomach. Visualized small and large bowel is normal caliber, with no bowel wall thickening. Vascular/Lymphatic: Atherosclerotic  nonaneurysmal abdominal aorta. No pathologically enlarged lymph nodes in the abdomen. Other: No pneumoperitoneum, ascites or focal fluid collection. Partially visualized moderate symmetric gynecomastia. Musculoskeletal: No aggressive appearing focal osseous lesions. Partially visualized bilateral posterior spinal fusion hardware at L3, L4 and L5. Marked degenerative disc disease in the lumbar spine. IMPRESSION: 1. Normal adrenal glands, with no adrenal mass. 2. No acute abnormality. 3. Mild diffuse hepatic steatosis. 4. Cholelithiasis. 5. Punctate nonobstructing upper left renal stone. 6. Moderate symmetric gynecomastia. 7.  Aortic Atherosclerosis (ICD10-I70.0). Electronically Signed   By: Ilona Sorrel M.D.   On: 05/30/2017 13:46      Medications:     Current Medications: . aspirin EC  81 mg Oral Daily  . carvedilol  3.125 mg Oral BID WC  . folic acid  1 mg Oral Daily  . insulin aspart  0-20 Units Subcutaneous TID WC  . insulin aspart  0-5 Units Subcutaneous QHS  . insulin aspart  6 Units Subcutaneous TID WC  . insulin glargine  28 Units Subcutaneous BID  . LORazepam  0-4 mg Intravenous Q12H  . multivitamin with minerals  1 tablet Oral Daily  . QUEtiapine  800 mg Oral QHS  . rivaroxaban  20 mg Oral Daily  . sacubitril-valsartan  1 tablet Oral BID  . thiamine  100 mg Oral Daily   Or  . thiamine  100 mg Intravenous Daily     Infusions:     Patient Profile   [name] [name] is a 62 y.o. male with h/o CVA 01/2012,  PFO (R/L shunt), Schizophrenia, NICM with LHC 04/2012 with normal cors, HTN, HLD, HIT, Hep C, hx of cocaine use, ETOH abuse, GERD, DMII, CKD stage II, Chronic combined CHF, prostate CA, and Afib.   Admitted 05/27/17 with AMS. Thought to be mild DKA. Meds held with hypotension.   Assessment/Plan   1. AMS - ? Mild DKA. Glucose 472 on admit - Treated per primary. Much improved.   2. Chronic combined CHF due to NICM - Echo 05/13/17 LVEF 30-35%, Grade 1 DD, No estimate of  PA pressure.  - Volume status stable to very mildly elevated. - Decrease Entresto to 24/26. Hold today with hypotension. Will attempt resumption tomorrow if stable. (Has been getting ~ 1 dose a day for past 2 days due to hold parameters - Continue coreg 3.125 mg BID for now.  - Resume spiro 12.5 mg qhs. - Hold torsemide for today with soft pressures. Will plan on resuming at PTA dose of 40 mg q am and 20 mg q pm tomorrow.   3. AKI on CKD stage III - Improve with holding meds and IVF  4. Non-Compliance - Has previously refused paramedicine.   5. HLD - OK to resume Crestor.   6. Afib - NSR currently.  -  Continue Xarelto.   7. Frequent PVCs - Noted previous admission.  - Needs 48 monitor as outpatient. May be contributing to his NICM  8. H/o of CVA - Continue Xarelto.   Suspect large part of hypotension was non-compliance PTA. Will resume meds as tolerated at lowest doses. Will need up-titration as outpatient.   Medication concerns reviewed with patient and pharmacy team. Barriers identified: Non-compliance.  Length of Stay: 0  Annamaria Helling  05/31/2017, 9:19 AM  Advanced Heart Failure Team Pager [phone] (M-F; 7a - 4p)  Please contact [facility] for night-coverage after hours (4p -7a ) and weekends on [email or web address]  Patient seen with PA, agree with the above note.  He was admitted with altered mental status and low BP in setting of hyperglycemic nonketotic event.  Neurology has seen, do not think he had a CVA.  Did have presumed TIA a few weeks ago in the setting of not taking Xarelto.   On exam, he does not appear significantly volume overloaded to me with JVP 8.  Trace ankle edema.    Nonischemic cardiomyopathy with stable volume.   - Continue Coreg 3.125 mg bid and add back spironolactone 12.5 daily.  - If BP stable, add back Entresto at 24/26 bid tomorrow.  - Restart home torsemide tomorrow.   History of paroxysmal atrial fibrillation, remains in NSR  today.  Continue Xarelto.   Loralie Champagne 05/31/2017 11:41 AM

## 2017-06-01 LAB — BASIC METABOLIC PANEL
ANION GAP: 6 (ref 5–15)
BUN: 11 mg/dL (ref 6–20)
CALCIUM: 9.9 mg/dL (ref 8.9–10.3)
CHLORIDE: 103 mmol/L (ref 101–111)
CO2: 28 mmol/L (ref 22–32)
Creatinine, Ser: 1.08 mg/dL (ref 0.61–1.24)
GFR calc non Af Amer: >60 mL/min (ref >60)
GLUCOSE: 234 mg/dL — AB (ref 65–99)
POTASSIUM: 4.3 mmol/L (ref 3.5–5.1)
Sodium: 137 mmol/L (ref 135–145)

## 2017-06-01 LAB — GLUCOSE, CAPILLARY
GLUCOSE-CAPILLARY: 245 mg/dL — AB (ref 65–99)
GLUCOSE-CAPILLARY: 248 mg/dL — AB (ref 65–99)
GLUCOSE-CAPILLARY: 298 mg/dL — AB (ref 65–99)
GLUCOSE-CAPILLARY: 267 mg/dL — AB (ref 65–99)
Glucose-Capillary: 268 mg/dL — ABNORMAL HIGH (ref 65–99)

## 2017-06-01 LAB — MAGNESIUM: Magnesium: 1.9 mg/dL (ref 1.7–2.4)

## 2017-06-01 MED ORDER — POLYETHYLENE GLYCOL 3350 17 G PO PACK
17.0000 g | PACK | Freq: Every day | ORAL | Status: DC
  Administered 2017-06-01 – 2017-06-02 (×2): 17 g via ORAL
  Filled 2017-06-01 (×3): qty 1

## 2017-06-01 MED ORDER — BISACODYL 10 MG RE SUPP
10.0000 mg | Freq: Once | RECTAL | Status: AC
  Administered 2017-06-02: 10 mg via RECTAL
  Filled 2017-06-01: qty 1

## 2017-06-01 MED ORDER — INSULIN GLARGINE 100 UNIT/ML ~~LOC~~ SOLN
30.0000 [IU] | Freq: Two times a day (BID) | SUBCUTANEOUS | Status: DC
  Administered 2017-06-01 – 2017-06-03 (×4): 30 [IU] via SUBCUTANEOUS
  Filled 2017-06-01 (×5): qty 0.3

## 2017-06-01 MED ORDER — SENNOSIDES-DOCUSATE SODIUM 8.6-50 MG PO TABS
1.0000 | ORAL_TABLET | Freq: Two times a day (BID) | ORAL | Status: DC
  Administered 2017-06-01 – 2017-06-03 (×5): 1 via ORAL
  Filled 2017-06-01 (×5): qty 1

## 2017-06-01 MED ORDER — INSULIN ASPART 100 UNIT/ML ~~LOC~~ SOLN
8.0000 [IU] | Freq: Three times a day (TID) | SUBCUTANEOUS | Status: DC
Start: 2017-06-01 — End: 2017-06-03
  Administered 2017-06-01 – 2017-06-03 (×9): 8 [IU] via SUBCUTANEOUS

## 2017-06-01 NOTE — Progress Notes (Addendum)
Advanced Heart Failure Rounding Note  PCP-Cardiologist: No primary care provider on file.   Subjective:    Diuresis held yesterday. Creatinine 1.08. SBP 80s this morning.   No CP or SOB. Feels lightheaded this morning. Denies bleeding. No CP or SOB.   Objective:   Weight Range: 285 lb 12.8 oz (129.6 kg) Body mass index is 39.86 kg/m.   Vital Signs:   Temp:  [97.8 F (36.6 C)-98.6 F (37 C)] 97.8 F (36.6 C) (05/07 0453) Pulse Rate:  [69-90] 69 (05/07 0453) Resp:  [14-19] 19 (05/07 0453) BP: (80-106)/(48-73) 80/48 (05/07 0453) SpO2:  [96 %-100 %] 97 % (05/07 0453) Weight:  [285 lb 12.8 oz (129.6 kg)] 285 lb 12.8 oz (129.6 kg) (05/07 0453) Last BM Date: 05/27/17  Weight change: Filed Weights   05/30/17 0505 05/31/17 0435 06/01/17 0453  Weight: 278 lb 12.8 oz (126.5 kg) 282 lb 6.4 oz (128.1 kg) 285 lb 12.8 oz (129.6 kg)    Intake/Output:   Intake/Output Summary (Last 24 hours) at 06/01/2017 0821 Last data filed at 06/01/2017 0700 Gross per 24 hour  Intake 960 ml  Output 1650 ml  Net -690 ml      Physical Exam    General:  Drowsy. No resp difficulty HEENT: Normal anicteric  Neck: Supple. JVP difficult, but does not appear elevated. Carotids 2+ bilat; no bruits. No lymphadenopathy or thyromegaly appreciated. Cor: PMI nondisplaced. Regular rate & rhythm. No rubs, gallops or murmurs. Lungs: Clear no wheeze Abdomen: Obese. Soft, nontender, nondistended. No hepatosplenomegaly. No bruits or masses. Good bowel sounds. Extremities: No cyanosis, clubbing, rash, trace ankle edema Neuro: drowsy but awakens alert & oriented x 3, cranial nerves grossly intact. moves all 4 extremities w/o difficulty. Affect pleasant    Telemetry   SR 70-80s with runs of bigeminy. Personally reviewed.   EKG    No new tracings.   Labs    CBC No results for input(s): WBC, NEUTROABS, HGB, HCT, MCV, PLT in the last 72 hours. Basic Metabolic Panel Recent Labs    05/31/17 0522  06/01/17 0454  NA 138 137  K 4.5 4.3  CL 104 103  CO2 26 28  GLUCOSE 226* 234*  BUN 10 11  CREATININE 1.00 1.08  CALCIUM 9.5 9.9  MG 2.0 1.9   Liver Function Tests No results for input(s): AST, ALT, ALKPHOS, BILITOT, PROT, ALBUMIN in the last 72 hours. No results for input(s): LIPASE, AMYLASE in the last 72 hours. Cardiac Enzymes No results for input(s): CKTOTAL, CKMB, CKMBINDEX, TROPONINI in the last 72 hours.  BNP: BNP (last 3 results) Recent Labs    11/26/16 1740 05/13/17 0317 05/27/17 2140  BNP 20.7 38.0 67.8    ProBNP (last 3 results) No results for input(s): PROBNP in the last 8760 hours.   D-Dimer No results for input(s): DDIMER in the last 72 hours. Hemoglobin A1C No results for input(s): HGBA1C in the last 72 hours. Fasting Lipid Panel No results for input(s): CHOL, HDL, LDLCALC, TRIG, CHOLHDL, LDLDIRECT in the last 72 hours. Thyroid Function Tests No results for input(s): TSH, T4TOTAL, T3FREE, THYROIDAB in the last 72 hours.  Invalid input(s): FREET3  Other results:   Imaging     No results found.   Medications:     Scheduled Medications: . carvedilol  3.125 mg Oral BID WC  . folic acid  1 mg Oral Daily  . insulin aspart  0-20 Units Subcutaneous TID WC  . insulin aspart  0-5 Units Subcutaneous QHS  .  insulin aspart  8 Units Subcutaneous TID WC  . insulin glargine  28 Units Subcutaneous BID  . multivitamin with minerals  1 tablet Oral Daily  . QUEtiapine  800 mg Oral QHS  . rivaroxaban  20 mg Oral Daily  . rosuvastatin  40 mg Oral q1800  . sacubitril-valsartan  1 tablet Oral BID  . spironolactone  12.5 mg Oral QHS  . thiamine  100 mg Oral Daily   Or  . thiamine  100 mg Intravenous Daily     Infusions:   PRN Medications:  acetaminophen, hydrALAZINE, nitroGLYCERIN, ondansetron (ZOFRAN) IV, zolpidem    Patient Profile   [name] [name] is a 62 y.o. male with h/o CVA 01/2012,  PFO (R/L shunt), Schizophrenia, NICM with LHC  04/2012 with normal cors, HTN, HLD, HIT, Hep C, hx of cocaine use, ETOH abuse, GERD, DMII, CKD stage II, Chronic combined CHF, prostate CA, and Afib.   Admitted 05/27/17 with AMS. Thought to be mild DKA. Meds held with hypotension.   Assessment/Plan   1. AMS - ? Mild DKA. Glucose 472 on admit - Treated per primary. Much improved. Drowsy this am, but oriented.   2. 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  - Echo 05/31/17 EF 25-30% (no change)  - Volume status stable to very mildly elevated. - Stop Entresto 24/26 with SBP 80s and lightheadedness - Continue coreg 3.125 mg BID for now.  - Continue spiro 12.5 mg qhs. - Continue holding torsemide with low BP and lightheadedness. PTA dose is 40 mg q am, 20 mg q pm  3. AKI on CKD stage III - Improve with holding meds and IVF. Creatinine 1.08 today  4. Non-Compliance - Has previously refused paramedicine. No change.   5. HLD - Continue Crestor.   6. Afib - NSR currently.  - Continue Xarelto. Denies bleeding.   7. Frequent PVCs - Noted previous admission. Runs of bigeminy overnight  - Needs 48 monitor as outpatient. May be contributing to his NICM  8. H/o of CVA - Continue Xarelto. No change.   Suspect large part of hypotension was non-compliance PTA. Will resume meds as tolerated at lowest doses. Will need up-titration as outpatient.   Medication concerns reviewed with patient and pharmacy team. Barriers identified: Non-compliance.   Length of Stay: [place], NP  06/01/2017, 8:21 AM  Advanced Heart Failure Team Pager [phone] (M-F; 7a - 4p)  Please contact [facility] for night-coverage after hours (4p -7a ) and weekends on [email or web address]  Patient seen and examined with the above-signed Advanced Practice Provider and/or Housestaff. I personally reviewed laboratory data, imaging studies and relevant notes. I independently examined the patient and  formulated the important aspects of the plan. I have edited the note to reflect any of my changes or salient points. I have personally discussed the plan with the patient and/or family.  Echo reviewed personally. EF 25-30% (no change from previous). Stable from HF perspective. BP remains low. Continue to hold diuretics and Entresto for now (doubt he will tolerate Entresto on d/c but will try to get on losartan prior to d/c). Remains in NSR. Continue Xarelto. He ahs very limited insight into his disease process.   Glori Bickers, MD  2:15 PM

## 2017-06-01 NOTE — Progress Notes (Signed)
Inpatient Diabetes Program Recommendations  AACE/ADA: New Consensus Statement on Inpatient Glycemic Control (2015)  Target Ranges:  Prepandial:   less than 140 mg/dL      Peak postprandial:   less than 180 mg/dL (1-2 hours)      Critically ill patients:  140 - 180 mg/dL   Lab Results  Component Value Date   GLUCAP 298 (H) 06/01/2017   HGBA1C 14.7 (H) 05/13/2017    Review of Glycemic ControlResults for BEVAN, DISNEY (MRN [id number]) as of 06/01/2017 12:25  Ref. Range 05/31/2017 15:48 05/31/2017 21:22 06/01/2017 06:47 06/01/2017 07:51 06/01/2017 11:57  Glucose-Capillary Latest Ref Range: 65 - 99 mg/dL 281 (H) 206 (H) 245 (H) 248 (H) 298 (H)    Diabetes history: Type 2 DM  Outpatient Diabetes medications: Lantus 50 units q HS, Novolin R 18 units tid with meals, Metformin 1000 mg bid  Current orders for Inpatient glycemic control:  Lantus 28 units bid, Novolog 8 units tid with meals, Novolog resistant tid with meals and HS  Inpatient Diabetes Program Recommendations:   Blood sugar continue to be > goal.  May consider increasing Lantus to 32 units bid and increase Novolog meal coverage to 10 units tid with meals.   Thanks,  Adah Perl, RN, BC-ADM Inpatient Diabetes Coordinator Pager [phone] (8a-5p)

## 2017-06-01 NOTE — Progress Notes (Signed)
PROGRESS NOTE  [name] [name] QMV:[id number] DOB: 1956/01/02 DOA: 05/27/2017 PCP: Rogers Blocker, MD  Brief summary:  Per ED intake on 5/2, Pt at 1837 family noticed pt was leaning to the right with new onset of AMS. Ems states that pt was weaker on the right side had a TIA about two weeks ago. Pt was axox2 per EMS. Pt upon arrival to ED was axox4 with no deficits at this time.   Acute stroke activated in the ED, per neurology , likely toxic metabolic encephalopathy, not recommend further brain imaging as his exam is completely nonfocal. No stroke w/u indicated at this time.     HPI/Recap of past 24 hours:   Report feeling better,  bp  remain low in the 80's,    He reports has been feeling dizzy standing up for the last three weeks. He is a poor historian, not sure history is reliable  He c/o being constipated   Denies chest pain, no sob, no fever, no edema   Assessment/Plan: Principal Problem:   DKA (diabetic ketoacidoses) (HCC) Active Problems:   HTN (hypertension)   Alcohol abuse   Paroxysmal atrial fibrillation (HCC)   Chronic combined systolic (congestive) and diastolic (congestive) heart failure (HCC)   Elevated troponin   Acute metabolic encephalopathy   CKD (chronic kidney disease), stage III (HCC)   Stroke (HCC)   Hypotension  Acute metabolic encephalopathy:  (persenting symptom) -Mental status has improved to the baseline.  Currently oriented x3.  - Patient had questionable right-sided weakness.  CT head with no acute intracranial abnormalities.  Neurology was consulted, Dr. Rory Percy did not think this is stroke, and did not recommend further stroke work-up.  Per Dr. Rory Percy it is most likely toxic metabolic encephalopathy.  -he has mild DKA with elevated blood glucose at 472 on presentation -UDS + opioids and cocaine -ua no infection, cxr no pna. -encephalopathy has resolved.  DKA (diabetic ketoacidoses) [facility]): Patient has elevated anion gap of 16,  bicarbonate 19, blood sugar 472, indicating possible mild DKA.   He is started on insulin drip, gap down to 6, he is transitioned off insulin drip,  Resolved, now back on  subQinsulin.  Insulin dependent dm2,  Recent a1c 14.7 -he reports he has highs in the 400's and lows down to the 20's at home, - will change lantus from daily dosing to split dosing  to hopefully prevent extreme high's or extreme lows, - am blood glucose remain elevated, will increase lantus to 30units bid,  meal coverage increase to 8units, and continue ssi -diabetes /diet education   H/o hypertension, now hypotension --Am random cortisol level is very low,  cosyntropin test response is appropriate - ct ab w/wo contrast no adrenal leisons -repeat echocardiogram on interval changes, consult cardiology for cardiac meds adjustment    Chronic combined systolic (congestive) and diastolic (congestive) heart failure ([place]):  -2D echo on 05/13/2017 showed EF of 30-35% with grade 1 diastolic dysfunction.  Patient does not have leg edema or JVD.  No respiratory distress. Repeat echo this hospitalization on significant interval changes -cxr with cardiomegaly, mild vascular congestion on presentation -he received hydration for dka , will d/c ivf,  -persistent hypotension, meds adjustment per cardiology -close monitor volume status   Paroxysmal Atrial Fibrillation:  I have personally reviewed EKD and tele strips, it showed sinus rhythm with first degree av block with frequent pvc's CHA2DS2-VASc Score is 6, needs oral anticoagulation. Patient is on Xarelto at home. -continue Xarelto and coreg  Chronically Elevated troponin: Troponin is chronically elevated, recent baseline 0.08-0.10.  His POC trop 0.12 and then trop 0.10. No CP.  at  baseline.  --on ASA, crestor and coreg  CKD (chronic kidney disease), stage II: stable. Baseline creatinine 1.0-1.3.  His creatinine is 1.30, BUN 13.   Hx of Stroke [facility]): -reports has  affected left side ,but recovered, no focal weakness now -Crestor ASA and crestor   Polysubstance abuse:  uds + opioids and cocaine  Alcohol abuse: -he reports drink wine once a month, not sure if reliable -CIWA protocol  Obesity: Body mass index is 39.86 kg/m.   Medication and medical follow up noncompliance, not a reliable historian.   Code Status: full  Family Communication: patient   Disposition Plan:  med tele bed home with home health in 1-2 days  needs bp and blood glucose stable Need cardiology clearance   Consultants:  cardiology  Procedures:  none  Antibiotics:  none   Objective: BP 96/69 (BP Location: Right Arm)   Pulse 87   Temp 97.8 F (36.6 C) (Oral)   Resp (!) 21   Ht 5\' 11"  (1.803 m)   Wt 129.6 kg (285 lb 12.8 oz)   SpO2 97%   BMI 39.86 kg/m   Intake/Output Summary (Last 24 hours) at 06/01/2017 1238 Last data filed at 06/01/2017 1100 Gross per 24 hour  Intake 1400 ml  Output 1125 ml  Net 275 ml   Filed Weights   05/30/17 0505 05/31/17 0435 06/01/17 0453  Weight: 126.5 kg (278 lb 12.8 oz) 128.1 kg (282 lb 6.4 oz) 129.6 kg (285 lb 12.8 oz)    Exam: Patient is examined daily including today on 06/01/2017, exams remain the same as of yesterday except that has changed    General:  AAox3  Cardiovascular: RRR  Respiratory: CTABL  Abdomen: Soft/ND/NT, positive BS  Musculoskeletal: No Edema, chronic venous stasis changes, skin is dark and hard  Neuro: AAOx3, no focal weakness  Data Reviewed: Basic Metabolic Panel: Recent Labs  Lab 05/28/17 1426 05/29/17 0506 05/30/17 0331 05/31/17 0522 06/01/17 0454  NA 136 135 134* 138 137  K 5.0 3.8 4.0 4.5 4.3  CL 103 100* 102 104 103  CO2 27 26 25 26 28   GLUCOSE 374* 206* 301* 226* 234*  BUN 9 11 10 10 11   CREATININE 1.17 1.08 1.06 1.00 1.08  CALCIUM 9.0 9.3 9.3 9.5 9.9  MG 1.8  --  1.8 2.0 1.9   Liver Function Tests: Recent Labs  Lab 05/27/17 1910  AST 33  ALT 45    ALKPHOS 98  BILITOT 0.9  PROT 7.0  ALBUMIN 4.0   No results for input(s): LIPASE, AMYLASE in the last 168 hours. No results for input(s): AMMONIA in the last 168 hours. CBC: Recent Labs  Lab 05/27/17 1910 05/27/17 1918 05/29/17 0506  WBC 6.3  --  5.7  NEUTROABS 3.2  --   --   HGB 15.5 16.3 14.0  HCT 46.2 48.0 43.7  MCV 90.9  --  91.8  PLT 175  --  152   Cardiac Enzymes:   Recent Labs  Lab 05/28/17 0001 05/28/17 0335 05/28/17 0816  TROPONINI 0.10* 0.11* 0.10*   BNP (last 3 results) Recent Labs    11/26/16 1740 05/13/17 0317 05/27/17 2140  BNP 20.7 38.0 67.8    ProBNP (last 3 results) No results for input(s): PROBNP in the last 8760 hours.  CBG: Recent Labs  Lab 05/31/17 1548 05/31/17 2122 06/01/17 0960  06/01/17 0751 06/01/17 1157  GLUCAP 281* 206* 245* 248* 298*    Recent Results (from the past 240 hour(s))  Culture, blood (Routine X 2) w Reflex to ID Panel     Status: None (Preliminary result)   Collection Time: 05/27/17  9:40 PM  Result Value Ref Range Status   Specimen Description BLOOD RIGHT WRIST  Final   Special Requests   Final    BOTTLES DRAWN AEROBIC AND ANAEROBIC Blood Culture results may not be optimal due to an excessive volume of blood received in culture bottles   Culture   Final    NO GROWTH 3 DAYS Performed at [facility], [place] [address], [place], [place] [address]    Report Status PENDING  Incomplete  Culture, blood (Routine X 2) w Reflex to ID Panel     Status: None (Preliminary result)   Collection Time: 05/27/17  9:44 PM  Result Value Ref Range Status   Specimen Description BLOOD LEFT ANTECUBITAL  Final   Special Requests   Final    BOTTLES DRAWN AEROBIC AND ANAEROBIC Blood Culture adequate volume   Culture   Final    NO GROWTH 3 DAYS Performed at [facility], [place] [address] [place], [place] [address]    Report Status PENDING  Incomplete     Studies: No results found.  Scheduled Meds: . bisacodyl  10  mg Rectal Once  . carvedilol  3.125 mg Oral BID WC  . folic acid  1 mg Oral Daily  . insulin aspart  0-20 Units Subcutaneous TID WC  . insulin aspart  0-5 Units Subcutaneous QHS  . insulin aspart  8 Units Subcutaneous TID WC  . insulin glargine  30 Units Subcutaneous BID  . multivitamin with minerals  1 tablet Oral Daily  . polyethylene glycol  17 g Oral Daily  . QUEtiapine  800 mg Oral QHS  . rivaroxaban  20 mg Oral Daily  . rosuvastatin  40 mg Oral q1800  . senna-docusate  1 tablet Oral BID  . spironolactone  12.5 mg Oral QHS  . thiamine  100 mg Oral Daily   Or  . thiamine  100 mg Intravenous Daily    Continuous Infusions:    Time spent: 25 mins, case discussed with cardiology I have personally reviewed and interpreted on  06/01/2017 daily labs, tele strips, imagings as discussed above under date review session and assessment and plans.  I reviewed all nursing notes, pharmacy notes,  Consult note, vitals, pertinent old records  I have discussed plan of care as described above with RN , patient  on 06/01/2017   Florencia Reasons MD, PhD  [facility] Pager [phone]. If 7PM-7AM, please contact night-coverage at [email or web address], password [facility] 06/01/2017, 12:38 PM  LOS: 1 day

## 2017-06-02 DIAGNOSIS — I950 Idiopathic hypotension: Secondary | ICD-10-CM

## 2017-06-02 LAB — BASIC METABOLIC PANEL
Anion gap: 5 (ref 5–15)
BUN: 10 mg/dL (ref 6–20)
CHLORIDE: 102 mmol/L (ref 101–111)
CO2: 29 mmol/L (ref 22–32)
Calcium: 10 mg/dL (ref 8.9–10.3)
Creatinine, Ser: 1 mg/dL (ref 0.61–1.24)
GFR calc non Af Amer: >60 mL/min (ref >60)
Glucose, Bld: 201 mg/dL — ABNORMAL HIGH (ref 65–99)
POTASSIUM: 4.2 mmol/L (ref 3.5–5.1)
SODIUM: 136 mmol/L (ref 135–145)

## 2017-06-02 LAB — GLUCOSE, CAPILLARY
GLUCOSE-CAPILLARY: 216 mg/dL — AB (ref 65–99)
Glucose-Capillary: 255 mg/dL — ABNORMAL HIGH (ref 65–99)
Glucose-Capillary: 203 mg/dL — ABNORMAL HIGH (ref 65–99)
Glucose-Capillary: 220 mg/dL — ABNORMAL HIGH (ref 65–99)

## 2017-06-02 LAB — CULTURE, BLOOD (ROUTINE X 2)
CULTURE: NO GROWTH
Culture: NO GROWTH
Special Requests: ADEQUATE

## 2017-06-02 MED ORDER — MIDODRINE HCL 5 MG PO TABS
2.5000 mg | ORAL_TABLET | Freq: Three times a day (TID) | ORAL | Status: DC
  Administered 2017-06-02 – 2017-06-03 (×6): 2.5 mg via ORAL
  Filled 2017-06-02 (×7): qty 1

## 2017-06-02 MED ORDER — TORSEMIDE 20 MG PO TABS
40.0000 mg | ORAL_TABLET | Freq: Every day | ORAL | Status: DC
  Administered 2017-06-02 – 2017-06-03 (×2): 40 mg via ORAL
  Filled 2017-06-02 (×2): qty 2

## 2017-06-02 NOTE — Progress Notes (Signed)
Inpatient Diabetes Program Recommendations  AACE/ADA: New Consensus Statement on Inpatient Glycemic Control (2015)  Target Ranges:  Prepandial:   less than 140 mg/dL      Peak postprandial:   less than 180 mg/dL (1-2 hours)      Critically ill patients:  140 - 180 mg/dL   Lab Results  Component Value Date   GLUCAP 255 (H) 06/02/2017   HGBA1C 14.7 (H) 05/13/2017    Review of Glycemic ControlResults for ABDALLA, NARAMORE (MRN [id number]) as of 06/02/2017 12:18  Ref. Range 06/01/2017 11:57 06/01/2017 16:54 06/01/2017 20:49 06/02/2017 07:54 06/02/2017 11:44  Glucose-Capillary Latest Ref Range: 65 - 99 mg/dL 298 (H) 268 (H) 267 (H) 216 (H) 255 (H)    Diabetes history: Type 2 DM Outpatient Diabetes medications: Lantus 50 units q HS, Novolin R 18 units tid with meals, Metformin 1000 mg bid Current orders for Inpatient glycemic control: Lantus 30 units bid, Novolog 8 units tid with meals, Novolog resistant tid with meals and HS  Inpatient Diabetes Program Recommendations:   Blood sugar continue to be > goal. May consider increasing Lantus to 34 units bid and increase Novolog meal coverage to 12 units tid with meals.    Thanks,  Adah Perl, RN, BC-ADM Inpatient Diabetes Coordinator Pager [phone] (8a-5p)

## 2017-06-02 NOTE — Progress Notes (Signed)
PROGRESS NOTE  [name] [name] RXV:[id number] DOB: [birth year] DOA: 05/27/2017 PCP: Rogers Blocker, MD  Brief summary:  Per ED intake on 5/2, Pt at 1837 family noticed pt was leaning to the right with new onset of AMS. Ems states that pt was weaker on the right side had a TIA about two weeks ago. Pt was axox2 per EMS. Pt upon arrival to ED was axox4 with no deficits at this time.   Acute stroke activated in the ED, per neurology , likely toxic metabolic encephalopathy, not recommend further brain imaging as his exam is completely nonfocal. No stroke w/u indicated at this time.     HPI/Recap of past 24 hours:   Report feeling better today. No complaints.  Denies chest pain, no sob, no fever, no edema   Assessment/Plan: Principal Problem:   DKA (diabetic ketoacidoses) (HCC) Active Problems:   HTN (hypertension)   Alcohol abuse   Paroxysmal atrial fibrillation (HCC)   Chronic combined systolic (congestive) and diastolic (congestive) heart failure (HCC)   Elevated troponin   Acute metabolic encephalopathy   CKD (chronic kidney disease), stage III (HCC)   Stroke (HCC)   Hypotension  Acute metabolic encephalopathy:  (persenting symptom, resolved) -Mental status has improved to the baseline.  Currently oriented x3.  - Patient had questionable right-sided weakness.  CT head with no acute intracranial abnormalities.  Neurology was consulted, Dr. Rory Percy did not think this is stroke, and did not recommend further stroke work-up.  Per Dr. Rory Percy it is most likely toxic metabolic encephalopathy.  -he has mild DKA with elevated blood glucose at 472 on presentation -UDS + opioids and cocaine -ua no infection, cxr no pna. -encephalopathy has resolved.  DKA (diabetic ketoacidoses) [facility]): Patient has elevated anion gap of 16, bicarbonate 19, blood sugar 472, indicating possible mild DKA.   He is started on insulin drip, gap down to 6, he is transitioned off insulin drip,  Resolved, now back  on  subQinsulin.  Insulin dependent dm2,  Recent a1c 14.7 -he reports he has highs in the 400's and lows down to the 20's at home, - will change lantus from daily dosing to split dosing  to hopefully prevent extreme high's or extreme lows, - am blood glucose remain elevated, will increase lantus to 30units bid,  meal coverage increase to 8units, and continue ssi -diabetes /diet education   H/o hypertension, now hypotension --Am random cortisol level is very low,  cosyntropin test response is appropriate - ct ab w/wo contrast no adrenal leisons -repeat echocardiogram on interval changes, consult cardiology for cardiac meds adjustment which is ongoing, will se ehow pt is in am    Chronic combined systolic (congestive) and diastolic (congestive) heart failure ([place]):  -2D echo on 05/13/2017 showed EF of 30-35% with grade 1 diastolic dysfunction.  Patient does not have leg edema or JVD.  No respiratory distress. Repeat echo this hospitalization on significant interval changes -cxr with cardiomegaly, mild vascular congestion on presentation -he received hydration for dka , will d/c ivf,  -persistent hypotension, meds adjustment per cardiology -close monitor volume status   Paroxysmal Atrial Fibrillation:  I have personally reviewed EKD and tele strips, it showed sinus rhythm with first degree av block with frequent pvc's CHA2DS2-VASc Score is 6, needs oral anticoagulation. Patient is on Xarelto at home. -continue Xarelto and coreg  Chronically Elevated troponin: Troponin is chronically elevated, recent baseline 0.08-0.10.  His POC trop 0.12 and then trop 0.10. No CP.  at  baseline.  --  on ASA, crestor and coreg  CKD (chronic kidney disease), stage II: stable. Baseline creatinine 1.0-1.3.  Will monitor.   Hx of Stroke [facility]): -reports has affected left side ,but recovered, no focal weakness now -Crestor ASA and crestor   Polysubstance abuse:  uds + opioids and cocaine  Alcohol  abuse: -he reports drink wine once a month, not sure if reliable -CIWA protocol  Obesity: Body mass index is 39.9 kg/m.   Medication and medical follow up noncompliance, not a reliable historian.   Code Status: full  Family Communication: patient   Disposition Plan:  med tele bed home with home health in 1-2 days  needs bp and blood glucose stable Need cardiology clearance   Consultants:  cardiology  Procedures:  none  Antibiotics:  none   Objective: BP 97/69 (BP Location: Right Arm)   Pulse 80   Temp 98.5 F (36.9 C) (Oral)   Resp 17   Ht 5\' 11"  (1.803 m)   Wt 129.8 kg (286 lb 1.6 oz)   SpO2 98%   BMI 39.90 kg/m   Intake/Output Summary (Last 24 hours) at 06/02/2017 1524 Last data filed at 06/02/2017 1459 Gross per 24 hour  Intake 1200 ml  Output 3350 ml  Net -2150 ml   Filed Weights   05/31/17 0435 06/01/17 0453 06/02/17 0500  Weight: 128.1 kg (282 lb 6.4 oz) 129.6 kg (285 lb 12.8 oz) 129.8 kg (286 lb 1.6 oz)    Exam: Patient is examined daily including today on 06/02/2017, exams remain the same as of yesterday except that has changed    General:  AAox3  Cardiovascular: RRR  Respiratory: CTABL  Abdomen: Soft/ND/NT, positive BS  Musculoskeletal: No Edema, chronic venous stasis changes, skin is dark and hard  Neuro: AAOx3, no focal weakness  Data Reviewed: Basic Metabolic Panel: Recent Labs  Lab 05/28/17 1426 05/29/17 0506 05/30/17 0331 05/31/17 0522 06/01/17 0454 06/02/17 0447  NA 136 135 134* 138 137 136  K 5.0 3.8 4.0 4.5 4.3 4.2  CL 103 100* 102 104 103 102  CO2 27 26 25 26 28 29   GLUCOSE 374* 206* 301* 226* 234* 201*  BUN 9 11 10 10 11 10   CREATININE 1.17 1.08 1.06 1.00 1.08 1.00  CALCIUM 9.0 9.3 9.3 9.5 9.9 10.0  MG 1.8  --  1.8 2.0 1.9  --    Liver Function Tests: Recent Labs  Lab 05/27/17 1910  AST 33  ALT 45  ALKPHOS 98  BILITOT 0.9  PROT 7.0  ALBUMIN 4.0   No results for input(s): LIPASE, AMYLASE in the last  168 hours. No results for input(s): AMMONIA in the last 168 hours. CBC: Recent Labs  Lab 05/27/17 1910 05/27/17 1918 05/29/17 0506  WBC 6.3  --  5.7  NEUTROABS 3.2  --   --   HGB 15.5 16.3 14.0  HCT 46.2 48.0 43.7  MCV 90.9  --  91.8  PLT 175  --  152   Cardiac Enzymes:   Recent Labs  Lab 05/28/17 0001 05/28/17 0335 05/28/17 0816  TROPONINI 0.10* 0.11* 0.10*   BNP (last 3 results) Recent Labs    11/26/16 1740 05/13/17 0317 05/27/17 2140  BNP 20.7 38.0 67.8    ProBNP (last 3 results) No results for input(s): PROBNP in the last 8760 hours.  CBG: Recent Labs  Lab 06/01/17 1157 06/01/17 1654 06/01/17 2049 06/02/17 0754 06/02/17 1144  GLUCAP 298* 268* 267* 216* 255*    Recent Results (from the past  240 hour(s))  Culture, blood (Routine X 2) w Reflex to ID Panel     Status: None   Collection Time: 05/27/17  9:40 PM  Result Value Ref Range Status   Specimen Description BLOOD RIGHT WRIST  Final   Special Requests   Final    BOTTLES DRAWN AEROBIC AND ANAEROBIC Blood Culture results may not be optimal due to an excessive volume of blood received in culture bottles   Culture   Final    NO GROWTH 5 DAYS Performed at [facility], [place] [address], [place], [place] [address]    Report Status 06/02/2017 FINAL  Final  Culture, blood (Routine X 2) w Reflex to ID Panel     Status: None   Collection Time: 05/27/17  9:44 PM  Result Value Ref Range Status   Specimen Description BLOOD LEFT ANTECUBITAL  Final   Special Requests   Final    BOTTLES DRAWN AEROBIC AND ANAEROBIC Blood Culture adequate volume   Culture   Final    NO GROWTH 5 DAYS Performed at [facility], [place] [address], [place], [place] [address]    Report Status 06/02/2017 FINAL  Final     Studies: No results found.  Scheduled Meds: . bisacodyl  10 mg Rectal Once  . carvedilol  3.125 mg Oral BID WC  . folic acid  1 mg Oral Daily  . insulin aspart  0-20 Units Subcutaneous TID WC  .  insulin aspart  0-5 Units Subcutaneous QHS  . insulin aspart  8 Units Subcutaneous TID WC  . insulin glargine  30 Units Subcutaneous BID  . midodrine  2.5 mg Oral TID WC  . multivitamin with minerals  1 tablet Oral Daily  . polyethylene glycol  17 g Oral Daily  . QUEtiapine  800 mg Oral QHS  . rivaroxaban  20 mg Oral Daily  . rosuvastatin  40 mg Oral q1800  . senna-docusate  1 tablet Oral BID  . spironolactone  12.5 mg Oral QHS  . thiamine  100 mg Oral Daily   Or  . thiamine  100 mg Intravenous Daily  . torsemide  40 mg Oral Daily    Continuous Infusions:    Time spent: 25 mins, case discussed with cardiology I have personally reviewed and interpreted on  06/02/2017 daily labs, tele strips, imagings as discussed above under date review session and assessment and plans.  I reviewed all nursing notes, pharmacy notes,  Consult note, vitals, pertinent old records  I have discussed plan of care as described above with RN , patient  on 06/02/2017   Elwin Mocha MD  [facility] Pager AMION If 7PM-7AM, please contact night-coverage at [email or web address], password [email or web address] 06/02/2017, 3:24 PM  LOS: 2 days

## 2017-06-02 NOTE — Progress Notes (Addendum)
Advanced Heart Failure Rounding Note  PCP-Cardiologist: No primary care provider on file.   Subjective:    Diuresis held again yesterday. Weight up 1 lb. SBP 80-90s. Creatinine stable 1.0  No CP or dizziness or orthostasis. Having intermittent SOB. Slept in chair. No orthopnea or PND.   Objective:   Weight Range: 286 lb 1.6 oz (129.8 kg) Body mass index is 39.9 kg/m.   Vital Signs:   Temp:  [98 F (36.7 C)-99 F (37.2 C)] 98 F (36.7 C) (05/08 0500) Pulse Rate:  [80-88] 80 (05/08 0500) Resp:  [17-23] 17 (05/08 0500) BP: (87-102)/(59-81) 87/62 (05/08 0500) SpO2:  [97 %-100 %] 98 % (05/08 0500) Weight:  [286 lb 1.6 oz (129.8 kg)] 286 lb 1.6 oz (129.8 kg) (05/08 0500) Last BM Date: 05/27/17  Weight change: Filed Weights   05/31/17 0435 06/01/17 0453 06/02/17 0500  Weight: 282 lb 6.4 oz (128.1 kg) 285 lb 12.8 oz (129.6 kg) 286 lb 1.6 oz (129.8 kg)    Intake/Output:   Intake/Output Summary (Last 24 hours) at 06/02/2017 0816 Last data filed at 06/02/2017 0256 Gross per 24 hour  Intake 1160 ml  Output 1500 ml  Net -340 ml      Physical Exam    General: Sitting in chair. No resp difficulty. HEENT: Normal anicteric  Neck: Supple. JVP difficult, but ~8-10. Carotids 2+ bilat; no bruits. No thyromegaly or nodule noted. Cor: PMI nondisplaced. RRR, No M/G/R noted Lungs: crackles in bases. No wheeze  Abdomen: obese, soft, non-tender, non-distended, no HSM. No bruits or masses. +BS  Extremities: No cyanosis, clubbing, or rash. R and LLE 1+ woody edema.  Neuro: Alert & orientedx3, cranial nerves grossly intact. moves all 4 extremities w/o difficulty. Affect pleasant   Telemetry   SR 70-80s with frequent PVCs and runs of bigeminy. Personally reviewed.   EKG    No new tracings.   Labs    CBC No results for input(s): WBC, NEUTROABS, HGB, HCT, MCV, PLT in the last 72 hours. Basic Metabolic Panel Recent Labs    05/31/17 0522 06/01/17 0454 06/02/17 0447  NA 138  137 136  K 4.5 4.3 4.2  CL 104 103 102  CO2 26 28 29   GLUCOSE 226* 234* 201*  BUN 10 11 10   CREATININE 1.00 1.08 1.00  CALCIUM 9.5 9.9 10.0  MG 2.0 1.9  --    Liver Function Tests No results for input(s): AST, ALT, ALKPHOS, BILITOT, PROT, ALBUMIN in the last 72 hours. No results for input(s): LIPASE, AMYLASE in the last 72 hours. Cardiac Enzymes No results for input(s): CKTOTAL, CKMB, CKMBINDEX, TROPONINI in the last 72 hours.  BNP: BNP (last 3 results) Recent Labs    11/26/16 1740 05/13/17 0317 05/27/17 2140  BNP 20.7 38.0 67.8    ProBNP (last 3 results) No results for input(s): PROBNP in the last 8760 hours.   D-Dimer No results for input(s): DDIMER in the last 72 hours. Hemoglobin A1C No results for input(s): HGBA1C in the last 72 hours. Fasting Lipid Panel No results for input(s): CHOL, HDL, LDLCALC, TRIG, CHOLHDL, LDLDIRECT in the last 72 hours. Thyroid Function Tests No results for input(s): TSH, T4TOTAL, T3FREE, THYROIDAB in the last 72 hours.  Invalid input(s): FREET3  Other results:   Imaging    No results found.   Medications:     Scheduled Medications: . bisacodyl  10 mg Rectal Once  . carvedilol  3.125 mg Oral BID WC  . folic acid  1  mg Oral Daily  . insulin aspart  0-20 Units Subcutaneous TID WC  . insulin aspart  0-5 Units Subcutaneous QHS  . insulin aspart  8 Units Subcutaneous TID WC  . insulin glargine  30 Units Subcutaneous BID  . multivitamin with minerals  1 tablet Oral Daily  . polyethylene glycol  17 g Oral Daily  . QUEtiapine  800 mg Oral QHS  . rivaroxaban  20 mg Oral Daily  . rosuvastatin  40 mg Oral q1800  . senna-docusate  1 tablet Oral BID  . spironolactone  12.5 mg Oral QHS  . thiamine  100 mg Oral Daily   Or  . thiamine  100 mg Intravenous Daily    Infusions:   PRN Medications: acetaminophen, hydrALAZINE, nitroGLYCERIN, ondansetron (ZOFRAN) IV, zolpidem    Patient Profile   [name] [name] is a 62 y.o.  male with h/o CVA 01/2012,  PFO (R/L shunt), Schizophrenia, NICM with LHC 04/2012 with normal cors, HTN, HLD, HIT, Hep C, hx of cocaine use, ETOH abuse, GERD, DMII, CKD stage II, Chronic combined CHF, prostate CA, and Afib.   Admitted 05/27/17 with AMS. Thought to be mild DKA. Meds held with hypotension.   Assessment/Plan   1. AMS - ? Mild DKA. Glucose 472 on admit - Treated per primary. Much improved. Drowsy this am, but oriented.   2. 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  - Echo 05/31/17 EF 25-30% (no change)  - Volume status elevated.  Delene Loll on hold with low BP - Continue coreg 3.125 mg BID for now. Hold parameters for SBP <90 - Continue spiro 12.5 mg qhs. - Restart torsemide 40 mg daily (previously on 40 am/20 pm) + midodrine 2.5 mg TID for BP support - Add TED hose  3. AKI on CKD stage III - Improve with holding meds and IVF. Creatinine 1.0 today.   4. Non-Compliance - Has previously refused paramedicine. No change.   5. HLD - Continue Crestor.   6. Afib - NSR currently.  - Continue Xarelto. Denies bleeding   7. Frequent PVCs - Needs 48 monitor as outpatient. May be contributing to his NICM. No change.   8. H/o of CVA - Continue Xarelto. No change.   Suspect large part of hypotension was non-compliance PTA. Will resume meds as tolerated at lowest doses. Will need up-titration as outpatient.   Medication concerns reviewed with patient and pharmacy team. Barriers identified: Non-compliance.   Length of Stay: [place], NP  06/02/2017, 8:16 AM  Advanced Heart Failure Team Pager [phone] (M-F; 7a - 4p)  Please contact [facility] for night-coverage after hours (4p -7a ) and weekends on [email or web address]  Patient seen and examined with the above-signed Advanced Practice Provider and/or Housestaff. I personally reviewed laboratory data, imaging studies and relevant notes. I independently  examined the patient and formulated the important aspects of the plan. I have edited the note to reflect any of my changes or salient points. I have personally discussed the plan with the patient and/or family.  Weight going back up. More SOB. Will restart diuretics. BP still low off HF meds. May have component of autonomic insufficiency. Start midodrine 2.5 TID.   Glori Bickers, MD  1:43 PM

## 2017-06-02 NOTE — Progress Notes (Signed)
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  Had confusion and gait changes with R side weakness prior to admission.  PMHx:  Substance abuse, pancreatitis, schizophrenia, CHF, CKD 3, cocaine use,  HTN, DM, stroke, depression    PT Comments    Patient is making progress toward PT goals. Pt with SOB with mobility and required 2 standing rest breaks while ambulating total of 129ft. Continue to recommend use of RW for energy conservation/safety upon d/c and pt in agreement. Pt with SpO2 WNL and HR into 120s with mobility. Pt able to safely negotiate steps simulating home entrance. Current plan remains appropriate.    Follow Up Recommendations  Home health PT;Supervision - Intermittent(Pt declined HH)     Equipment Recommendations  Rolling walker with 5" wheels    Recommendations for Other Services       Precautions / Restrictions Precautions Precautions: Fall Restrictions Weight Bearing Restrictions: No    Mobility  Bed Mobility               General bed mobility comments: Up in chair  Transfers Overall transfer level: Needs assistance Equipment used: None Transfers: Sit to/from Stand Sit to Stand: Supervision         General transfer comment: supervision for safety  Ambulation/Gait Ambulation/Gait assistance: Supervision(for safety) Ambulation Distance (Feet): 180 Feet Assistive device: None Gait Pattern/deviations: Decreased stride length;Step-through pattern;Wide base of support;Trunk flexed Gait velocity: reduced   General Gait Details: cues for increased stride length and breathing technique; SOB with mobility and need for 2 standing rest breaks   Stairs Stairs: Yes Stairs assistance: Min guard Stair Management: One rail Right;Alternating pattern;Forwards Number  of Stairs: 2     Wheelchair Mobility    Modified Rankin (Stroke Patients Only)       Balance Overall balance assessment: Needs assistance Sitting-balance support: Feet supported Sitting balance-Leahy Scale: Good     Standing balance support: During functional activity;No upper extremity supported Standing balance-Leahy Scale: Fair                              Cognition Arousal/Alertness: Awake/alert Behavior During Therapy: WFL for tasks assessed/performed Overall Cognitive Status: Within Functional Limits for tasks assessed                                        Exercises      General Comments General comments (skin integrity, edema, etc.): SpO2 >95% on RA, RR into 30s and HR up to 120s with mobility      Pertinent Vitals/Pain Pain Assessment: No/denies pain    Home Living                      Prior Function            PT Goals (current goals can now be found in the care plan section) Progress towards PT goals: Progressing toward goals    Frequency    Min 3X/week      PT Plan Current plan remains appropriate    Co-evaluation              AM-PAC PT "6 Clicks" Daily Activity  Outcome  Measure  Difficulty turning over in bed (including adjusting bedclothes, sheets and blankets)?: None Difficulty moving from lying on back to sitting on the side of the bed? : A Little Difficulty sitting down on and standing up from a chair with arms (e.g., wheelchair, bedside commode, etc,.)?: A Little Help needed moving to and from a bed to chair (including a wheelchair)?: A Little Help needed walking in hospital room?: A Little Help needed climbing 3-5 steps with a railing? : A Little 6 Click Score: 19    End of Session Equipment Utilized During Treatment: Gait belt Activity Tolerance: Patient tolerated treatment well Patient left: in chair;with call bell/phone within reach Nurse Communication: Mobility status PT Visit  Diagnosis: Unsteadiness on feet (R26.81);Muscle weakness (generalized) (M62.81);Adult, failure to thrive (R62.7)     Time: 1040-1055 PT Time Calculation (min) (ACUTE ONLY): 15 min  Charges:  $Gait Training: 8-22 mins                    G Codes:       Earney Navy, PTA Pager: [phone]     Darliss Cheney 06/02/2017, 12:17 PM

## 2017-06-03 LAB — BASIC METABOLIC PANEL
ANION GAP: 8 (ref 5–15)
BUN: 12 mg/dL (ref 6–20)
CO2: 31 mmol/L (ref 22–32)
CREATININE: 1.18 mg/dL (ref 0.61–1.24)
Calcium: 10.1 mg/dL (ref 8.9–10.3)
Chloride: 101 mmol/L (ref 101–111)
GFR calc non Af Amer: >60 mL/min (ref >60)
Glucose, Bld: 140 mg/dL — ABNORMAL HIGH (ref 65–99)
POTASSIUM: 4 mmol/L (ref 3.5–5.1)
SODIUM: 140 mmol/L (ref 135–145)

## 2017-06-03 LAB — GLUCOSE, CAPILLARY
GLUCOSE-CAPILLARY: 238 mg/dL — AB (ref 65–99)
GLUCOSE-CAPILLARY: 258 mg/dL — AB (ref 65–99)
Glucose-Capillary: 166 mg/dL — ABNORMAL HIGH (ref 65–99)

## 2017-06-03 MED ORDER — CARVEDILOL 3.125 MG PO TABS: 3.1250 mg | ORAL_TABLET | Freq: Two times a day (BID) | ORAL | 6 refills | Status: DC

## 2017-06-03 MED ORDER — METFORMIN HCL 1000 MG PO TABS: 500.0000 mg | ORAL_TABLET | Freq: Every day | ORAL | 0 refills | Status: DC

## 2017-06-03 MED ORDER — SPIRONOLACTONE 25 MG PO TABS: 12.5000 mg | ORAL_TABLET | Freq: Every day | ORAL | 6 refills | Status: DC

## 2017-06-03 MED ORDER — INSULIN GLARGINE 100 UNIT/ML SOLOSTAR PEN: 30.0000 [IU] | PEN_INJECTOR | Freq: Two times a day (BID) | SUBCUTANEOUS | 0 refills | Status: DC

## 2017-06-03 MED ORDER — QUETIAPINE FUMARATE 400 MG PO TABS: 400.0000 mg | ORAL_TABLET | Freq: Two times a day (BID) | ORAL | 0 refills | Status: AC

## 2017-06-03 MED ORDER — MIDODRINE HCL 2.5 MG PO TABS: 2.5000 mg | ORAL_TABLET | Freq: Three times a day (TID) | ORAL | 6 refills | Status: DC

## 2017-06-03 MED ORDER — RIVAROXABAN 20 MG PO TABS: 20.0000 mg | ORAL_TABLET | Freq: Every day | ORAL | 6 refills | Status: DC

## 2017-06-03 MED ORDER — TORSEMIDE 20 MG PO TABS: 40.0000 mg | ORAL_TABLET | Freq: Every day | ORAL | 6 refills | Status: DC

## 2017-06-03 NOTE — Progress Notes (Addendum)
Advanced Heart Failure Rounding Note  PCP-Cardiologist: No primary care provider on file.   Subjective:    Diuresis resumed 06/02/17 with mild volume overload.  Midodrine added. SBPs 90s-100s this am.   Denies CP, dizziness, or orthostasis. Breathing better. Good urine output with resumption of torsemide. Feeling better.   Creatinine relatively stable at 1.18. K 4.0. Weight shows down 6 lbs. Negative 2.2 L on po diuretics.   Objective:   Weight Range: 280 lb 4.8 oz (127.1 kg) Body mass index is 39.09 kg/m.   Vital Signs:   Temp:  [97.9 F (36.6 C)-98.5 F (36.9 C)] 97.9 F (36.6 C) (05/09 0553) Pulse Rate:  [80-81] 81 (05/09 0553) Resp:  [19-24] 19 (05/09 0553) BP: (93-112)/(66-91) 93/66 (05/09 0553) SpO2:  [92 %-99 %] 99 % (05/09 0553) Weight:  [280 lb 4.8 oz (127.1 kg)] 280 lb 4.8 oz (127.1 kg) (05/09 0553) Last BM Date: 06/02/17  Weight change: Filed Weights   06/01/17 0453 06/02/17 0500 06/03/17 0553  Weight: 285 lb 12.8 oz (129.6 kg) 286 lb 1.6 oz (129.8 kg) 280 lb 4.8 oz (127.1 kg)    Intake/Output:   Intake/Output Summary (Last 24 hours) at 06/03/2017 1134 Last data filed at 06/03/2017 0858 Gross per 24 hour  Intake 1200 ml  Output 3400 ml  Net -2200 ml      Physical Exam   General: Well appearing. No resp difficulty. HEENT: Seating in chair. Anicteric  Neck: Supple. JVP difficult, but appears around 5-6 cm. Carotids 2+ bilat; no bruits. No thyromegaly or nodule noted. Cor: PMI nondisplaced. RRR, No M/G/R noted Lungs: CTAB, normal effort. No wheeze  Abdomen: Soft, non-tender, non-distended, no HSM. No bruits or masses. +BS  Extremities: No cyanosis, clubbing, or rash. Trace woody edema.  Neuro: alert & oriented x 3, cranial nerves grossly intact. moves all 4 extremities w/o difficulty. Affect pleasant    Telemetry   NSR 70-80s with PVCs and occasional bigeminy, personally reviewed.   EKG    No new tracings.    Labs    CBC No results for  input(s): WBC, NEUTROABS, HGB, HCT, MCV, PLT in the last 72 hours. Basic Metabolic Panel Recent Labs    06/01/17 0454 06/02/17 0447 06/03/17 0528  NA 137 136 140  K 4.3 4.2 4.0  CL 103 102 101  CO2 28 29 31   GLUCOSE 234* 201* 140*  BUN 11 10 12   CREATININE 1.08 1.00 1.18  CALCIUM 9.9 10.0 10.1  MG 1.9  --   --    Liver Function Tests No results for input(s): AST, ALT, ALKPHOS, BILITOT, PROT, ALBUMIN in the last 72 hours. No results for input(s): LIPASE, AMYLASE in the last 72 hours. Cardiac Enzymes No results for input(s): CKTOTAL, CKMB, CKMBINDEX, TROPONINI in the last 72 hours.  BNP: BNP (last 3 results) Recent Labs    11/26/16 1740 05/13/17 0317 05/27/17 2140  BNP 20.7 38.0 67.8    ProBNP (last 3 results) No results for input(s): PROBNP in the last 8760 hours.   D-Dimer No results for input(s): DDIMER in the last 72 hours. Hemoglobin A1C No results for input(s): HGBA1C in the last 72 hours. Fasting Lipid Panel No results for input(s): CHOL, HDL, LDLCALC, TRIG, CHOLHDL, LDLDIRECT in the last 72 hours. Thyroid Function Tests No results for input(s): TSH, T4TOTAL, T3FREE, THYROIDAB in the last 72 hours.  Invalid input(s): FREET3  Other results:   Imaging    No results found.   Medications:  Scheduled Medications: . carvedilol  3.125 mg Oral BID WC  . folic acid  1 mg Oral Daily  . insulin aspart  0-20 Units Subcutaneous TID WC  . insulin aspart  0-5 Units Subcutaneous QHS  . insulin aspart  8 Units Subcutaneous TID WC  . insulin glargine  30 Units Subcutaneous BID  . midodrine  2.5 mg Oral TID WC  . multivitamin with minerals  1 tablet Oral Daily  . polyethylene glycol  17 g Oral Daily  . QUEtiapine  800 mg Oral QHS  . rivaroxaban  20 mg Oral Daily  . rosuvastatin  40 mg Oral q1800  . senna-docusate  1 tablet Oral BID  . spironolactone  12.5 mg Oral QHS  . thiamine  100 mg Oral Daily   Or  . thiamine  100 mg Intravenous Daily  .  torsemide  40 mg Oral Daily    Infusions:   PRN Medications: acetaminophen, hydrALAZINE, nitroGLYCERIN, ondansetron (ZOFRAN) IV, zolpidem    Patient Profile   PARKE JANDREAU is a 62 y.o. male with h/o CVA 01/2012,  PFO (R/L shunt), Schizophrenia, NICM with LHC 04/2012 with normal cors, HTN, HLD, HIT, Hep C, hx of cocaine use, ETOH abuse, GERD, DMII, CKD stage II, Chronic combined CHF, prostate CA, and Afib.   Admitted 05/27/17 with AMS. Thought to be mild DKA. Meds held with hypotension.   Assessment/Plan   1. AMS - ? Mild DKA. Glucose 472 on admit - Treated per primary. Resolved.   2. 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  - Echo 05/31/17 EF 25-30% (no change)  - Volume status improved back on torsemide.  Delene Loll on hold with low BP - Continue coreg 3.125 mg BID for now. Hold parameters for SBP <90 - Continue spiro 12.5 mg qhs. - Continue torsemide 40 mg daily (previously on 40 am/20 pm) + midodrine 2.5 mg TID for BP support - Continue TED hose  3. AKI on CKD stage III - Improve with holding meds and IVF. Creatinine 1.0 today.   4. Non-Compliance - Has previously refused paramedicine.  - Will make sure he has close follow up and attempt to re-engage paramedicine.   5. HLD - Continue Crestor. No change.   6. Afib - Remains in NSR.  - Continue Xarelto. No bleeding.    7. Frequent PVCs - Needs 48 monitor as outpatient. May be contributing to his NICM.  - No change to current plan.    8. H/o of CVA - Continue Xarelto.  - No change to current plan.    Suspect large part of hypotension was non-compliance PTA. Will resume meds as tolerated at lowest doses. Will need up-titration as outpatient.   Medication concerns reviewed with patient and pharmacy team. Barriers identified: Non-compliance.   OK for home from HF perspective. Close follow up made for Wednesday 06/09/17  HF meds for home  (Ordered by HF team for continuity) - Coreg 3.125 mg BID - Spiro 12.5 mg daily - Torsemide 40 mg daily.   - Midodrine 2.5 mg TID - Xarelto 20 mg daily.   Length of Stay: 3  Annamaria Helling  06/03/2017, 11:34 AM  Advanced Heart Failure Team Pager [phone] (M-F; 7a - 4p)  Please contact [facility] for night-coverage after hours (4p -7a ) and weekends on [email or web address]  Patient seen and examined with the above-signed Advanced Practice Provider and/or Housestaff. I personally reviewed  laboratory data, imaging studies and relevant notes. I independently examined the patient and formulated the important aspects of the plan. I have edited the note to reflect any of my changes or salient points. I have personally discussed the plan with the patient and/or family.  Looks much bette. Ok for d/c today from our standpoint on above meds. May need to cut torsemide back as outpatient.  Needs 48 event monitor. Will arrange close f/u in HF Clinic.   Glori Bickers, MD  1:14 PM

## 2017-06-03 NOTE — Discharge Instructions (Addendum)
Hyperglycemia Hyperglycemia is when the sugar (glucose) level in your blood is too high. It may not cause symptoms. If you do have symptoms, they may include warning signs, such as:  Feeling more thirsty than normal.  Hunger.  Feeling tired.  Needing to pee (urinate) more than normal.  Blurry eyesight (vision). You may get other symptoms as it gets worse, such as:  Dry mouth.  Not being hungry (loss of appetite).  Fruity-smelling breath.  Weakness.  Weight gain or loss that is not planned. Weight loss may be fast.  A tingling or numb feeling in your hands or feet.  Headache.  Skin that does not bounce back quickly when it is lightly pinched and released (poor skin turgor).  Pain in your belly (abdomen).  Cuts or bruises that heal slowly. High blood sugar can happen to people who do or do not have diabetes. High blood sugar can happen slowly or quickly, and it can be an emergency. Follow these instructions at home: General instructions  Take over-the-counter and prescription medicines only as told by your doctor.  Do not use products that contain nicotine or tobacco, such as cigarettes and e-cigarettes. If you need help quitting, ask your doctor.  Limit alcohol intake to no more than 1 drink per day for nonpregnant women and 2 drinks per day for men. One drink equals 12 oz of beer, 5 oz of wine, or 1 oz of hard liquor.  Manage stress. If you need help with this, ask your doctor.  Keep all follow-up visits as told by your doctor. This is important. Eating and drinking  Stay at a healthy weight.  Exercise regularly, as told by your doctor.  Drink enough fluid, especially when you:  Exercise.  Get sick.  Are in hot temperatures.  Eat healthy foods, such as:  Low-fat (lean) proteins.  Complex carbs (complex carbohydrates), such as whole wheat bread or brown rice.  Fresh fruits and vegetables.  Low-fat dairy products.  Healthy fats.  Drink enough  fluid to keep your pee (urine) clear or pale yellow. If you have diabetes:  Make sure you know the symptoms of hyperglycemia.  Follow your diabetes management plan, as told by your doctor. Make sure you:  Take insulin and medicines as told.  Follow your exercise plan.  Follow your meal plan. Eat on time. Do not skip meals.  Check your blood sugar as often as told. Make sure to check before and after exercise. If you exercise longer or in a different way than you normally do, check your blood sugar more often.  Follow your sick day plan whenever you cannot eat or drink normally. Make this plan ahead of time with your doctor.  Share your diabetes management plan with people in your workplace, school, and household.  Check your urine for ketones when you are ill and as told by your doctor.  Carry a card or wear jewelry that says that you have diabetes. Contact a doctor if:  Your blood sugar level is higher than 240 mg/dL (13.3 mmol/L) for 2 days in a row.  You have problems keeping your blood sugar in your target range.  High blood sugar happens often for you. Get help right away if:  You have trouble breathing.  You have a change in how you think, feel, or act (mental status).  You feel sick to your stomach (nauseous), and that feeling does not go away.  You cannot stop throwing up (vomiting). These symptoms may be an   emergency. Do not wait to see if the symptoms will go away. Get medical help right away. Call your local emergency services (911 in the [place] Do not drive yourself to the hospital.  Summary  Hyperglycemia is when the sugar (glucose) level in your blood is too high.  High blood sugar can happen to people who do or do not have diabetes.  Make sure you drink enough fluids, eat healthy foods, and exercise regularly.  Contact your doctor if you have problems keeping your blood sugar in your target range. This information is not intended to replace advice given  to you by your health care provider. Make sure you discuss any questions you have with your health care provider. Document Released: 11/09/2008 Document Revised: 09/30/2015 Document Reviewed: 09/30/2015 [employer] Interactive Patient Education  2017 [employer]  

## 2017-06-03 NOTE — Discharge Summary (Signed)
[facility] Discharge Summary  [name] [name] PFY:[id number] DOB: 10-15-1955 DOA: 05/27/2017  PCP: Rogers Blocker, MD  Admit date: 05/27/2017 Discharge date: 06/03/2017  Time spent: 35 minutes  Recommendations for Outpatient Follow-up:  1. HF clinic f/u as organized by cardiology 2. PCP f/u 2 weeks   Discharge Diagnoses:  Principal Problem:   DKA (diabetic ketoacidoses) ([place]) Active Problems:   HTN (hypertension)   Alcohol abuse   Paroxysmal atrial fibrillation (HCC)   Chronic combined systolic (congestive) and diastolic (congestive) heart failure (HCC)   Elevated troponin   Acute metabolic encephalopathy   CKD (chronic kidney disease), stage III ([place])   Stroke (HCC)   Hypotension   Discharge Condition: stable  Diet recommendation: heart/Carb Diet  Filed Weights   06/01/17 0453 06/02/17 0500 06/03/17 0553  Weight: 129.6 kg (285 lb 12.8 oz) 129.8 kg (286 lb 1.6 oz) 127.1 kg (280 lb 4.8 oz)    History of present illness:  HPI: [name] [name] is a 62 y.o. male with medical history significant of hypertension, diabetes mellitus, stroke, depression, atrial fibrillation on Xarelto, alcohol abuse, chronic mild elevation of troponin, prostate cancer, CHF with EF of 30%, CKD 3, cocaine abuse, HCV, pancreatitis, H IT, PFO, schizophrenia, who presents with altered mental status, questionable right-sided weakness.  Per report, pt was noted to be confused at about 6:45 PM. Pt was limping to the right sitting in his chair, with questionable right-sided weakness. Fingerstick glucose at that time was higher than the highest limit of the machine. Pt was treated with IVF, and his mental status has gradually improved.  When I saw the patient in the ED, he is oriented x3.  Patient denies unilateral weakness, numbness or tingling his extremities.  No facial droop, slurred speech.  No vision change or hearing loss.  Patient states that he used cocaine yesterday.  Patient denies chest pain,  shortness of breath.  He has some mild dry cough, but no fever or chills.  Denies symptoms of UTI.  No nausea, vomiting, diarrhea, abdominal pain.   Hospital Course:  Patient had mild DKA on admission.  Temporarily on DKA protocol and quickly this resolved.  Patient's mental status improved with resolution of this acute likely acute metabolic encephalopathy.  Patient admits to being noncompliant with medication and this explains his DKA and heart failure seen on admission.  New echo shows decrease in his ejection fraction.  See below.  Cardiology was consulted.  Medication adjustments made mostly by cutting most doses because patient reports just not taking any medication.  Her doses seem to produce hypotension. Midodrine given by cardiology..  Due to his alcohol abuse history patient was on CIWA protocol during his stay but not withdrawal.  Cardiology agreed that patient was stable to be discharged once hypotension was resolved.  Patient states he feels safe at home.  Nursing states patient has been ambulating independently well and eating and drinking.  Patient discharged home.  Procedures: 5/6 ECHO Study Conclusions  - Left ventricle: The cavity size was normal. Systolic function was   severely reduced. The estimated ejection fraction was in the   range of 25% to 30%. Diffuse hypokinesis. There is akinesis of   the anterolateral myocardium. - Tricuspid valve: There was mild regurgitation. - Pericardium, extracardiac: There was no pericardial effusion.  Consultations:  cardiology  Discharge Exam: Vitals:   06/03/17 0553 06/03/17 1314  BP: 93/66 114/77  Pulse: 81 88  Resp: 19 (!) 25  Temp: 97.9 F (36.6 C)  99.7 F (37.6 C)  SpO2: 99% 96%    General: NAD, A&Ox3 Cardiovascular: RRR, no MRG Respiratory: CTAB, nl wob  Discharge Instructions   Discharge Instructions    AMB referral to CHF clinic   Complete by:  As directed    Call MD for:  persistant dizziness or  light-headedness   Complete by:  As directed    Call MD for:  redness, tenderness, or signs of infection (pain, swelling, redness, odor or green/yellow discharge around incision site)   Complete by:  As directed    Call MD for:  severe uncontrolled pain   Complete by:  As directed    Call MD for:  temperature >100.4   Complete by:  As directed    Diet - low sodium heart healthy   Complete by:  As directed    Carb modified.   Diet - low sodium heart healthy   Complete by:  As directed    Diet Carb Modified   Complete by:  As directed    Discharge instructions   Complete by:  As directed    PCP f/u in 1 week   Increase activity slowly   Complete by:  As directed    Increase activity slowly   Complete by:  As directed      Allergies as of 06/03/2017      Reactions   Heparin Other (See Comments)   Documented HIT under problem list Pt states he's not allergic. " They told me not take it anymore"   Thorazine [chlorpromazine Hcl] Other (See Comments)   Body freezes up      Medication List    STOP taking these medications   sacubitril-valsartan 97-103 MG Commonly known as:  ENTRESTO     TAKE these medications   ACCU-CHEK FASTCLIX LANCETS Misc Check blood sugar 4 times a day before meals and bedtime   ACCU-CHEK GUIDE w/Device Kit 1 each by Does not apply route 4 (four) times daily.   B-D UF III MINI PEN NEEDLES 31G X 5 MM Misc Generic drug:  Insulin Pen Needle Use four times daily as directed. Dx code E11.00, Z79.4   carvedilol 3.125 MG tablet Commonly known as:  COREG Take 1 tablet (3.125 mg total) by mouth 2 (two) times daily with a meal. What changed:    medication strength  how much to take   glucose blood test strip Commonly known as:  ACCU-CHEK GUIDE Check blood sugar 4 times a day before meal and bedtime   Insulin Glargine 100 UNIT/ML Solostar Pen Commonly known as:  LANTUS SOLOSTAR Inject 30 Units into the skin 2 (two) times daily. What changed:     how much to take  when to take this   insulin regular 100 units/mL injection Commonly known as:  NOVOLIN R,HUMULIN R 3 times with meals.Take 30 minutes before meal. What changed:    how much to take  how to take this  when to take this  additional instructions   metFORMIN 1000 MG tablet Commonly known as:  GLUCOPHAGE Take 0.5 tablets (500 mg total) by mouth daily with breakfast for 14 days. What changed:    how much to take  when to take this   midodrine 2.5 MG tablet Commonly known as:  PROAMATINE Take 1 tablet (2.5 mg total) by mouth 3 (three) times daily with meals.   QUEtiapine 400 MG tablet Commonly known as:  SEROQUEL Take 1 tablet (400 mg total) by mouth 2 (two) times daily for  21 days. What changed:    how much to take  when to take this   rivaroxaban 20 MG Tabs tablet Commonly known as:  XARELTO Take 1 tablet (20 mg total) by mouth daily. Start taking on:  06/04/2017   rosuvastatin 40 MG tablet Commonly known as:  CRESTOR Take 1 tablet (40 mg total) by mouth daily.   spironolactone 25 MG tablet Commonly known as:  ALDACTONE Take 0.5 tablets (12.5 mg total) by mouth at bedtime. What changed:  when to take this   torsemide 20 MG tablet Commonly known as:  DEMADEX Take 2 tablets (40 mg total) by mouth daily. Take extra 20 mg in the evenings as needed. Start taking on:  06/04/2017 What changed:    how much to take  how to take this  when to take this  additional instructions            Durable Medical Equipment  (From admission, onward)        Start     Ordered   05/29/17 1622  For home use only DME Walker rolling  [facility])  Once    Question:  Patient needs a walker to treat with the following condition  Answer:  FTT (failure to thrive) in adult   05/29/17 1621   05/28/17 1708  For home use only DME Walker rolling  (Walkers)  Once    Question:  Patient needs a walker to treat with the following condition  Answer:  FTT (failure  to thrive) in adult   05/28/17 1707     Allergies  Allergen Reactions  . Heparin Other (See Comments)    Documented HIT under problem list Pt states he's not allergic. " They told me not take it anymore"  . Thorazine [Chlorpromazine Hcl] Other (See Comments)    Body freezes up   [facility] Follow up.   Why:  Best boy information: [place] [id number] [phone]        [facility] Follow up on 06/09/2017.   Specialty:  Cardiology Why:  at 230 pm for post hospital follow up. The code for parking is 1200. Leisure centre manager thru [employer] off of [address], underground parking on your right. Can also park in lower ED lot and enter blue awning.  Contact information: [address] [phone] [place] [phone]           The results of significant diagnostics from this hospitalization (including imaging, microbiology, ancillary and laboratory) are listed below for reference.    Significant Diagnostic Studies: Dg Chest 2 View  Result Date: 05/12/2017 CLINICAL DATA:  Weakness EXAM: CHEST - 2 VIEW COMPARISON:  11/26/2016 FINDINGS: Cardiac enlargement without heart failure. Lungs are clear without infiltrate or effusion. Chronic right AC separation unchanged. Degenerative change in the left AC joint. IMPRESSION: No active cardiopulmonary disease. Electronically Signed   By: Franchot Gallo M.D.   On: 05/12/2017 18:35   Ct Head Wo Contrast  Result Date: 05/12/2017 CLINICAL DATA:  Hyperglycemic for 2 weeks as patient ran out of short-acting insulin. Weakness since Monday. EXAM: CT HEAD WITHOUT CONTRAST TECHNIQUE: Contiguous axial images were obtained from the base of the skull through the vertex without intravenous contrast. COMPARISON:  02/25/2014 FINDINGS: Brain: No evidence of acute infarction, hemorrhage, hydrocephalus, or  extra-axial fluid collections. Small calcified extra-axial densities overlying both parietal lobes compatible with tiny calcified meningiomas.  No underlying mass-effect or edema. Vascular: Atherosclerosis of the carotid siphons. No hyperdense vessel sign. Skull: No acute skull fracture. Sinuses/Orbits: No acute finding. Bilateral lens replacements. Intact orbits and globes. Clear mastoids. Other: Mild supraorbital soft tissue swelling on the right. IMPRESSION: Mild right supraorbital soft tissue swelling. No acute intracranial abnormality. Electronically Signed   By: Ashley Royalty M.D.   On: 05/12/2017 20:12   Mr Brain Wo Contrast  Result Date: 05/13/2017 CLINICAL DATA:  LEFT lower extremity weakness transient, and resolved. Multiple comorbidities, which include diabetes, atrial fibrillation, and hypertension. Also history of ETOH and cocaine abuse. EXAM: MRI HEAD WITHOUT CONTRAST TECHNIQUE: Multiplanar, multiecho pulse sequences of the brain and surrounding structures were obtained without intravenous contrast. COMPARISON:  CT head 05/12/2017. MR head 02/20/2012. FINDINGS: Brain: 3 mm focus of restricted diffusion, corresponding low ADC, LEFT prefrontal cortex, consistent with a small acute infarct. Similar 2 mm focus of restricted diffusion, RIGHT prefrontal cortex, no hemorrhage, mass lesion, hydrocephalus, or extra-axial fluid. Premature for age atrophy. T2 and FLAIR hyperintensities scattered throughout the white matter, predominantly subcortical, likely small vessel disease. Sequelae of old cerebellar and brainstem infarcts, encephalomalacia. Prominent perivascular spaces likely reflect chronic hypertension Vascular: Flow voids are maintained throughout the carotid, basilar, and vertebral arteries. There are no areas of chronic hemorrhage. Skull and upper cervical spine: Normal marrow signal. Sinuses/Orbits: No significant opacity or layering fluid. Negative orbits. Other: RIGHT frontal/supraorbital soft  tissue swelling. IMPRESSION: 3 mm area of acute nonhemorrhagic infarction, LEFT prefrontal cortex. Similar 2 mm area of acute nonhemorrhagic infarction, RIGHT prefrontal cortex. Shower of emboli is suspected. Premature for age atrophy with probable small vessel disease. Prior old infarcts. Electronically Signed   By: Staci Righter M.D.   On: 05/13/2017 18:03   Dg Chest Port 1 View  Result Date: 05/27/2017 CLINICAL DATA:  Altered mental status EXAM: PORTABLE CHEST 1 VIEW COMPARISON:  05/13/2017, 05/12/2017, 11/26/2016 FINDINGS: Mild cardiomegaly with central vascular congestion. No acute opacity or pleural effusion. No pneumothorax. IMPRESSION: Cardiomegaly with mild central congestion. Electronically Signed   By: Donavan Foil M.D.   On: 05/27/2017 21:07   Dg Chest Port 1 View  Result Date: 05/13/2017 CLINICAL DATA:  Shortness of Breath EXAM: PORTABLE CHEST 1 VIEW COMPARISON:  05/12/2017 FINDINGS: Cardiomegaly. Low lung volumes with bibasilar atelectasis. No overt edema or effusions. No acute bony abnormality. IMPRESSION: Cardiomegaly.  Low volumes with bibasilar atelectasis. Electronically Signed   By: Rolm Baptise M.D.   On: 05/13/2017 09:26   Ct Adrenal Abdomen Wo Contrast  Result Date: 05/30/2017 CLINICAL DATA:  Inpatient. Low a.m. cortisol level. Hypotension. Clinical concern for adrenal neoplasm. EXAM: CT ABDOMEN WITHOUT CONTRAST TECHNIQUE: Multidetector CT imaging of the abdomen was performed following the standard protocol without IV contrast. COMPARISON:  12/19/2012 CT abdomen/pelvis. FINDINGS: Lower chest: Mild scarring versus atelectasis at the dependent left lung base. Hepatobiliary: Mild diffuse hepatic steatosis. No definite liver surface irregularity. No liver masses. Cholelithiasis. No biliary ductal dilatation. Pancreas: Normal, with no mass or duct dilation. Spleen: Multiple similar complex cystic splenic masses, partially calcified, largest 7.1 cm, unchanged since 12/19/2012 CT,  considered benign. Overall normal size spleen. Adrenals/Urinary Tract: Normal adrenal glands, with no adrenal nodules. Punctate nonobstructing 1-2 mm upper left renal stone. No additional renal stones. No hydronephrosis. Simple 2.2 cm posterior upper left renal cyst. No additional contour deforming renal lesions. Stomach/Bowel: Normal non-distended stomach. Visualized small and large bowel is normal caliber, with no bowel wall thickening. Vascular/Lymphatic: Atherosclerotic nonaneurysmal abdominal aorta. No pathologically  enlarged lymph nodes in the abdomen. Other: No pneumoperitoneum, ascites or focal fluid collection. Partially visualized moderate symmetric gynecomastia. Musculoskeletal: No aggressive appearing focal osseous lesions. Partially visualized bilateral posterior spinal fusion hardware at L3, L4 and L5. Marked degenerative disc disease in the lumbar spine. IMPRESSION: 1. Normal adrenal glands, with no adrenal mass. 2. No acute abnormality. 3. Mild diffuse hepatic steatosis. 4. Cholelithiasis. 5. Punctate nonobstructing upper left renal stone. 6. Moderate symmetric gynecomastia. 7.  Aortic Atherosclerosis (ICD10-I70.0). Electronically Signed   By: Ilona Sorrel M.D.   On: 05/30/2017 13:46   Ct Head Code Stroke Wo Contrast  Result Date: 05/27/2017 CLINICAL DATA:  Code stroke. 62 y/o M; altered level of consciousness, right-sided deficits. EXAM: CT HEAD WITHOUT CONTRAST TECHNIQUE: Contiguous axial images were obtained from the base of the skull through the vertex without intravenous contrast. COMPARISON:  05/12/2017 CT head.  05/13/2017 MRI head. FINDINGS: Brain: No evidence of acute infarction, hemorrhage, hydrocephalus, extra-axial collection or mass lesion/mass effect. Stable chronic microvascular ischemic changes and parenchymal volume loss of the brain. Vascular: Calcific atherosclerosis of carotid siphons. No hyperdense vessel identified. Skull: Normal. Negative for fracture or focal lesion.  Sinuses/Orbits: No acute finding. Other: None. ASPECTS [facility] Stroke Program Early CT Score) - Ganglionic level infarction (caudate, lentiform nuclei, internal capsule, insula, M1-M3 cortex): 7 - Supraganglionic infarction (M4-M6 cortex): 3 Total score (0-10 with 10 being normal): 10 IMPRESSION: 1. No acute intracranial abnormality identified. 2. ASPECTS is 10 3. Stable chronic microvascular ischemic changes and parenchymal volume loss of the brain. These results were communicated to Dr. Rory Percy at 7:40 pmon 5/2/2019by text page via the [facility] messaging system. Electronically Signed   By: Kristine Garbe M.D.   On: 05/27/2017 19:41    Microbiology: Recent Results (from the past 240 hour(s))  Culture, blood (Routine X 2) w Reflex to ID Panel     Status: None   Collection Time: 05/27/17  9:40 PM  Result Value Ref Range Status   Specimen Description BLOOD RIGHT WRIST  Final   Special Requests   Final    BOTTLES DRAWN AEROBIC AND ANAEROBIC Blood Culture results may not be optimal due to an excessive volume of blood received in culture bottles   Culture   Final    NO GROWTH 5 DAYS Performed at [facility], [place] [address] [place], [place] [address]    Report Status 06/02/2017 FINAL  Final  Culture, blood (Routine X 2) w Reflex to ID Panel     Status: None   Collection Time: 05/27/17  9:44 PM  Result Value Ref Range Status   Specimen Description BLOOD LEFT ANTECUBITAL  Final   Special Requests   Final    BOTTLES DRAWN AEROBIC AND ANAEROBIC Blood Culture adequate volume   Culture   Final    NO GROWTH 5 DAYS Performed at [facility], [place] [address], [place], [place] [address]    Report Status 06/02/2017 FINAL  Final     Labs: Basic Metabolic Panel: Recent Labs  Lab 05/28/17 1426  05/30/17 0331 05/31/17 0522 06/01/17 0454 06/02/17 0447 06/03/17 0528  NA 136   < > 134* 138 137 136 140  K 5.0   < > 4.0 4.5 4.3 4.2 4.0  CL 103   < > 102 104 103 102 101  CO2 27   <  > _0 GLUCOSE 374*   < > 301* 226* 234* 201* 140*  BUN 9   < >  _0 CREATININE 1.17   < > 1.06 1.00 1.08 1.00 1.18  CALCIUM 9.0   < > 9.3 9.5 9.9 10.0 10.1  MG 1.8  --  1.8 2.0 1.9  --   --    < > = values in this interval not displayed.   Liver Function Tests: Recent Labs  Lab 05/27/17 1910  AST 33  ALT 45  ALKPHOS 98  BILITOT 0.9  PROT 7.0  ALBUMIN 4.0   No results for input(s): LIPASE, AMYLASE in the last 168 hours. No results for input(s): AMMONIA in the last 168 hours. CBC: Recent Labs  Lab 05/27/17 1910 05/27/17 1918 05/29/17 0506  WBC 6.3  --  5.7  NEUTROABS 3.2  --   --   HGB 15.5 16.3 14.0  HCT 46.2 48.0 43.7  MCV 90.9  --  91.8  PLT 175  --  152   Cardiac Enzymes: Recent Labs  Lab 05/28/17 0001 05/28/17 0335 05/28/17 0816  TROPONINI 0.10* 0.11* 0.10*   BNP: BNP (last 3 results) Recent Labs    11/26/16 1740 05/13/17 0317 05/27/17 2140  BNP 20.7 38.0 67.8    ProBNP (last 3 results) No results for input(s): PROBNP in the last 8760 hours.  CBG: Recent Labs  Lab 06/02/17 1631 06/02/17 2130 06/03/17 0745 06/03/17 1153 06/03/17 1614  GLUCAP 203* 220* 166* 238* 258*       Signed:  Elwin Mocha MD  FACP  [facility] 06/03/2017, 6:27 PM

## 2017-06-09 ENCOUNTER — Ambulatory Visit (HOSPITAL_COMMUNITY)
Admit: 2017-06-09 | Discharge: 2017-06-09 | Disposition: A | Discharge status: Home / Self Care | Payer: Medicare Other | Attending: Cardiology | Admitting: Cardiology

## 2017-06-09 ENCOUNTER — Encounter (HOSPITAL_COMMUNITY): Payer: Self-pay

## 2017-06-09 VITALS — BP 156/86 | HR 89 | Wt 281.0 lb

## 2017-06-09 DIAGNOSIS — I480 Paroxysmal atrial fibrillation: Secondary | ICD-10-CM | POA: Diagnosis not present

## 2017-06-09 DIAGNOSIS — I493 Ventricular premature depolarization: Secondary | ICD-10-CM

## 2017-06-09 DIAGNOSIS — R002 Palpitations: Secondary | ICD-10-CM

## 2017-06-09 DIAGNOSIS — I950 Idiopathic hypotension: Secondary | ICD-10-CM | POA: Diagnosis not present

## 2017-06-09 DIAGNOSIS — I5042 Chronic combined systolic (congestive) and diastolic (congestive) heart failure: Secondary | ICD-10-CM

## 2017-06-09 DIAGNOSIS — N182 Chronic kidney disease, stage 2 (mild): Secondary | ICD-10-CM | POA: Diagnosis not present

## 2017-06-09 DIAGNOSIS — I509 Heart failure, unspecified: Secondary | ICD-10-CM | POA: Insufficient documentation

## 2017-06-09 LAB — BASIC METABOLIC PANEL
Anion gap: 9 (ref 5–15)
BUN: 12 mg/dL (ref 6–20)
CO2: 25 mmol/L (ref 22–32)
CREATININE: 1.09 mg/dL (ref 0.61–1.24)
Calcium: 9.8 mg/dL (ref 8.9–10.3)
Chloride: 104 mmol/L (ref 101–111)
GFR calc Af Amer: >60 mL/min (ref >60)
GLUCOSE: 129 mg/dL — AB (ref 65–99)
Potassium: 3.5 mmol/L (ref 3.5–5.1)
SODIUM: 138 mmol/L (ref 135–145)

## 2017-06-09 NOTE — Progress Notes (Signed)
Patient ID: [name] [name], male   DOB: [birth year], 62 y.o.   MRN: [id number]    Advanced Heart Failure Clinic Note   Primary Care: Internal Medicine clinic, Dr. Silverio Decamp Primary Cardiologist: Dr. Doylene Canard  GI: Dr Ardis Hughs.  Primary HF: Dr. Haroldine Laws   HPI: [name] [name] is a 62 y.o. 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 Had history of acute renal failure in setting of rhabdo requiring short term HD.     Admitted with new onset HF in April 2014.   He was diuresed and discharged home.  Discharge weight 256 pounds. Echo at the time showed EF 38-75%, grade 1 diastolic dysfunction.    Admitted to [facility] 5/30 through 06/27/12 with GI bleed. EGD 06/25/12 multiple ulcers noted. Continued on protonix.   Admitted 8/2 through 09/03/15 with marked volume overload in the setting of medication compliance. Diuresed with IV lasix and transitioned to torsemide 60 mg daily. Hospital course complicated by elevated renal function.Clonidine was stopped on discharge and carvedilol was cut back to 6.25 mg twice a day. Discharge weight 272 pounds.    He was admitted 11/21-11/24 for Hyperosmolar hyperglycemic state after he had stopped taking all of his diabetes medications.    He was admitted 5/2-06/03/17 with DKA. He had stopped taking his medications. Insulin was adjusted. Echo showed EF 25-30%. His Entresto was DC'd with hypotension and he was started on midodrine. HF meds adjusted as tolerated. DC weight 280 lbs.   He presents today for post hospital follow up. Overall doing well. He is sometimes SOB with walking on flat road, but not SOB with ADLs. Denies orthopnea/PND/edema. Denies CP, dizziness, palpitations. Good appetite. Energy level is good. No bleeding on xarelto. He has been compliant  with medications since discharge. Weights ~ 280 lbs at home. He has taken one extra torsemide since discharge for SOB. He has not checked BP at home.   ECHO 10/27/12 EF 40-45%  ECHO 06/04/15 LVEF 20-25%, decreased diastolic compliance, LAE, RV severely reduced.  ECHO 11/26/15 EF 30%  Echo 12/26/15 EF 30-35% RV ok.  ECHO 05/31/17 EF 25-30%  RHC 08/2015 RA = 7 RV = 35/10 PA = 37/15 (25) PCW = 10 Fick cardiac output/index = 4.3/1.7 PVR = 3.5 WU Ao sat = 95% PA sat = 58%, 58% Assessment: 1. Volume status looks good. 2. Cardiac output depressed  2014 LHC -  Left main: Normal LAD: Large vessel gives of 2 diagonals. Normal LCX: Very large vessel with 3 OMs. 20-30% plaque in midsection.  RCA: Dominant. Normal LV-gram done in the RAO projection: Ejection fraction = 35-40% global HK Assessment: 1. Minimal CAD  Labs 02/12/13 K 4.3 Creatinine 0.98          08/2013: K 3.9, creatinine 1.27, Mag 1.8, pro-BNP 392, Troponin 0.05          06/07/15: K 4.2, creatinine 1.21          07/10/2015: K 3.9 Creatinine 1.16           09/03/2015: K 4.7 Creatinine 1.32          09/11/2015: K 4.1 Creatinine 1.29   SH: Quit smoking 9 years ago. Lives alone. Quit ETOH 23-Sep-2013 FH: Mom died from MI in her early 82s  Brother MI and deceased. Does not know about fathers health  history.   Review of systems complete and found to be negative unless listed in HPI.   Past Medical History:  Diagnosis Date  . Atrial fibrillation ([place])   . Cancer [facility])    Prostate cancer-bx. 3 weeks ago  . Cataract   . Chronic combined systolic and diastolic CHF (congestive heart failure) (HCC)    a) EF 40-45% per 2D echo (02/2012) with grade 1 DD b)  NICM c) RHC (04/2012): RA: 4, RV 45/3/4, PA 42/9 (24), PCWP 14, Fick CO/CI: 5.2 /2.2, PVR 1.9 WU, PA 62% and 64% d) ECHO (10/2012) EF 40-45%, grade II DD, RV nl  . CKD (chronic kidney disease) stage 2, GFR 60-89 ml/min    BL SCr approximately 1-1.3  . Colitis 05/2009   History of  colitis of ascending colon noted on CT abd/pelvis (05/2009), with interval resolution with subsequent CT  . Continuous chronic alcoholism ([place])   . Degenerative lumbar spinal stenosis    s/p L2-3, L3-4, L4-5 laminectomy partial facetectomy, and bilateral foraminotomy  . Diabetes mellitus without complication ([place])    Type II  . Dysrhythmia    A. Fib  . Family history of adverse reaction to anesthesia    "sister, can't go to sleep"  . GERD (gastroesophageal reflux disease)   . H/O cocaine abuse   . Hepatic steatosis    suspected 2/2 alcohol abuse  . Hepatitis C   . History of pancreatitis 01/2011   Admission for acute pancreatitis presumed 2/2 ongoing alcohol abuse- and hypertriglyceridemia  . History of pneumonia   . HIT (heparin-induced thrombocytopenia) ([place])   . Hyperlipidemia   . Hypertension    uncontrolled with medication noncompliance  . Hypertriglyceridemia   . NICM (nonischemic cardiomyopathy) ([place])    a. LHC (04/2012): nl arteries  . PFO (patent foramen ovale) 01/2012   with right to left shunt, noted per TEE in evaluation for source of embolic stroke in 06/2692  . Rhabdomyolysis 02/22/2012   H/O rhabdomyolysis in 01/2012 that was idiopathic, cause never identified  . Schizophrenia, schizo-affective ([place])   . Shortness of breath dyspnea   . Splenic cyst   . Stroke ([place]) 01/2012   Small cerebellar infarcts right greater than left as well as questionable acute left external capsule and caudate nuclear punctate lacunar infarcts noted per MRI (01/2012) - presumed to be embolic likely source PFO with right to left shunt (noted per TEE 01/ 2014)    Current Outpatient Medications  Medication Sig Dispense Refill  . ACCU-CHEK FASTCLIX LANCETS MISC Check blood sugar 4 times a day before meals and bedtime 204 each 5  . B-D UF III MINI PEN NEEDLES 31G X 5 MM MISC Use four times daily as directed. Dx code E11.00, Z79.4 100 each 3  . Blood Glucose Monitoring Suppl (ACCU-CHEK GUIDE)  w/Device KIT 1 each by Does not apply route 4 (four) times daily. 1 kit 1  . carvedilol (COREG) 3.125 MG tablet Take 1 tablet (3.125 mg total) by mouth 2 (two) times daily with a meal. 60 tablet 6  . glucose blood (ACCU-CHEK GUIDE) test strip Check blood sugar 4 times a day before meal and bedtime 150 each 5  . Insulin Glargine (LANTUS SOLOSTAR) 100 UNIT/ML Solostar Pen Inject 30 Units into the skin 2 (two) times daily. 18 mL 0  . insulin regular (NOVOLIN R,HUMULIN R) 100 units/mL injection 3 times with meals.Take 30 minutes before meal. (Patient taking differently: Inject 18 Units into the skin 3 (three) times daily before  meals. ) 15 mL 1  . metFORMIN (GLUCOPHAGE) 1000 MG tablet Take 0.5 tablets (500 mg total) by mouth daily with breakfast for 14 days. 7 tablet 0  . midodrine (PROAMATINE) 2.5 MG tablet Take 1 tablet (2.5 mg total) by mouth 3 (three) times daily with meals. 90 tablet 6  . QUEtiapine (SEROQUEL) 400 MG tablet Take 1 tablet (400 mg total) by mouth 2 (two) times daily for 21 days. 42 tablet 0  . rivaroxaban (XARELTO) 20 MG TABS tablet Take 1 tablet (20 mg total) by mouth daily. 30 tablet 6  . rosuvastatin (CRESTOR) 40 MG tablet Take 1 tablet (40 mg total) by mouth daily. 30 tablet 0  . spironolactone (ALDACTONE) 25 MG tablet Take 0.5 tablets (12.5 mg total) by mouth at bedtime. 15 tablet 6  . torsemide (DEMADEX) 20 MG tablet Take 2 tablets (40 mg total) by mouth daily. Take extra 20 mg in the evenings as needed. 60 tablet 6   No current facility-administered medications for this encounter.     Allergies  Allergen Reactions  . Heparin Other (See Comments)    Documented HIT under problem list Pt states he's not allergic. " They told me not take it anymore"  . Thorazine [Chlorpromazine Hcl] Other (See Comments)    Body freezes up     Vitals:   06/09/17 1426  BP: (!) 156/86  Pulse: 89  SpO2: 97%  Weight: 281 lb (127.5 kg)   Wt Readings from Last 3 Encounters:  06/09/17  281 lb (127.5 kg)  06/03/17 280 lb 4.8 oz (127.1 kg)  05/12/17 273 lb (123.8 kg)     PHYSICAL EXAM: General: Well appearing. No resp difficulty. HEENT: Normal Neck: Supple. JVP 6-7. Carotids 2+ bilat; no bruits. No thyromegaly or nodule noted. Cor: PMI nondisplaced. RRR, No M/G/R noted Lungs: CTAB, normal effort. Abdomen: Soft, non-tender, non-distended, no HSM. No bruits or masses. +BS  Extremities: No cyanosis, clubbing, or rash. R and LLE no edema.  Neuro: Alert & orientedx3, cranial nerves grossly intact. moves all 4 extremities w/o difficulty. Affect pleasant  EKG: SR with sinus arrhythmia and 1st degree AV block, 86 bpm. Personally reviewed.    ASSESSMENT /PLAN 1) Chronic combined systolic/diastolic HF: EF 19-41%, grade II DD (10/2012) now decreased to 20-25%  (05/2015) RV severely reduced. Echo 4/19 EF 30-35% RV ok. Had [place] 2014 with minimal CAD. Echo 05/31/17 EF 25-30%, RV normal - NYHA II-III. Volume status stable - Continue torsemide 40 mg daily.  - Continue spironolactone 12.5 mg daily.  - Continue carvedilol 3.125 mg BID - Entresto DCd with hypotension during admission  - Stop midodrine. Consider adding back ARB or Entresto at next visit if blood pressure improves.  - Will have him wear 48-hour Holter to determine PVC burden. Since he has been off medications, will wait to repeat Echo after he has been compliant +/- management of PVCs. If EF remains <35% on optimal therapy, will need to refer for ICD - Reinforced fluid restriction to < 2 L daily, sodium restriction to less than 2000 mg daily, and the importance of daily weights.    2) HTN - Elevated today. Will stop midodrine and reassess in 2 weeks. 3) PAF:  - EKG today shows NSR - Continue Xarelto 20 mg daily. Denies bleeding. 4) ETOH abuse - Abstinent. No change 5) CKD stage II - BMET today 6) ?OSA - Missed appt for sleep study. Need to encourage him to reschedule next visit.  7) DMII - Per PCP.  8) Medication  Noncompliance - Kennyth Lose, HF CSW met with him today - He has been referred to HF Paramedicine program 9) Frequent PVCs while inpatient - 48 hour Holter to assess PVC burden  DC midodrine 48 hour Holter monitor Follow up in 2 weeks with pharmacy, 4 weeks on APP side  Georgiana Shore, NP  2:44 PM  Greater than 50% of the 25 minute visit was spent in counseling/coordination of care regarding disease state education, salt/fluid restriction, sliding scale diuretics, and medication compliance.

## 2017-06-09 NOTE — Progress Notes (Signed)
Heart Failure Clinic Social Work Assessment  ARVIE BARTHOLOMEW  presents today in association with an [facility] Appointment.  Patient seen today by CSW for follow up/assistance on Health problems Other: adherence and/or  medications.   Social Determinants impacting successful heart failure regimen:  Housing: patient lives with family members. Food: Do you have enough food? Yes  Do you know and understand healthy eating and how that affects your heart failure diagnosis? Yes  Do you follow a low salt diet?  Yes  Utilities: Do you have gas and/or electricity on in your home? Yes  Income: What is your source of income? disability Insurance: Building control surveyor: Do you have transportation to your medical appointments?Yes      Daily Health Needs: Do you have a scale and weigh each day?  States he has a scale but does Not weigh daily.  Do you have your medications? No missing refill for insulin Are you able to adhere to your medication regimen? No   Do you ever take medications differently than prescribed? No  Do you know the zones of Heart Failure?  Yes  Do you know how to contact the HF Clinic appropriately with worsening symptoms or weight increases?  Yes    Do you have an Advanced Directive?  Unsure  If not, would you like to complete? Maybe  Do you have any identified obstacles / challenges for adherence to current treatment plan? Patient denies although after lengthy discussion agrees to referral for Community Paramedicine to assist with coordination of care and adherence to medications.   Patient appears reserved about the community paramedicine program although willing to try to improve his health and gain support and knowledge about heart failure.   CSW assisted patient with Problem-solving teaching/coping strategies and referral to [employer] with the goal to Increase healthy adjustment to current life circumstances, Increase  motivation to adhere to plan of care and Improve medication compliance   Heart Failure Clinic Social Worker will continue to coordinate and monitor patient's treatment plan as needed.  CSW continues to be available for identified needs. Raquel Sarna, [place], [place]

## 2017-06-09 NOTE — Addendum Note (Signed)
Encounter addended by: Louann Liv, LCSW on: 06/09/2017 4:09 PM  Actions taken: Sign clinical note

## 2017-06-09 NOTE — Patient Instructions (Signed)
Routine lab work today. Will notify you of abnormal results, otherwise no news is good news!  Will schedule you for a holter monitor (heart monitor) to be placed at [facility] office. Address: [address] ([place]), [place], [place] [address]  Phone: [phone] Their office will call you to schedule.  Follow up 2 weeks with Pharmacist.  Follow up 4 weeks with Lillia Mountain NP-C.  Take all medication as prescribed the day of your appointment. Bring all medications with you to your appointment.  Do the following things EVERYDAY: 1) Weigh yourself in the morning before breakfast. Write it down and keep it in a log. 2) Take your medicines as prescribed 3) Eat low salt foods-Limit salt (sodium) to 2000 mg per day.  4) Stay as active as you can everyday 5) Limit all fluids for the day to less than 2 liters

## 2017-06-10 ENCOUNTER — Telehealth (HOSPITAL_COMMUNITY): Payer: Self-pay

## 2017-06-10 NOTE — Telephone Encounter (Signed)
I called [name] [name] to schedule an initial visit. Upon explaining who I was and purpose for calling, he advised that he had changed his mind and was no longer interested in the program. I explained that there is not a fee or charge associated with the program but he insisted that he was not interested. I will mark him for discharge.

## 2017-06-17 ENCOUNTER — Ambulatory Visit (INDEPENDENT_AMBULATORY_CARE_PROVIDER_SITE_OTHER): Payer: Medicare Other

## 2017-06-17 DIAGNOSIS — R002 Palpitations: Secondary | ICD-10-CM

## 2017-06-23 ENCOUNTER — Ambulatory Visit (HOSPITAL_COMMUNITY)
Admission: RE | Admit: 2017-06-23 | Discharge: 2017-06-23 | Disposition: A | Discharge status: Home / Self Care | Payer: Medicare Other | Source: Ambulatory Visit | Attending: Internal Medicine | Admitting: Internal Medicine

## 2017-06-23 VITALS — BP 138/98 | HR 107 | Wt 276.4 lb

## 2017-06-23 DIAGNOSIS — F1011 Alcohol abuse, in remission: Secondary | ICD-10-CM | POA: Insufficient documentation

## 2017-06-23 DIAGNOSIS — E1122 Type 2 diabetes mellitus with diabetic chronic kidney disease: Secondary | ICD-10-CM | POA: Insufficient documentation

## 2017-06-23 DIAGNOSIS — Z79899 Other long term (current) drug therapy: Secondary | ICD-10-CM | POA: Insufficient documentation

## 2017-06-23 DIAGNOSIS — F1411 Cocaine abuse, in remission: Secondary | ICD-10-CM | POA: Insufficient documentation

## 2017-06-23 DIAGNOSIS — N182 Chronic kidney disease, stage 2 (mild): Secondary | ICD-10-CM | POA: Insufficient documentation

## 2017-06-23 DIAGNOSIS — I5022 Chronic systolic (congestive) heart failure: Secondary | ICD-10-CM | POA: Diagnosis present

## 2017-06-23 DIAGNOSIS — I130 Hypertensive heart and chronic kidney disease with heart failure and stage 1 through stage 4 chronic kidney disease, or unspecified chronic kidney disease: Secondary | ICD-10-CM | POA: Diagnosis not present

## 2017-06-23 DIAGNOSIS — Z8546 Personal history of malignant neoplasm of prostate: Secondary | ICD-10-CM | POA: Insufficient documentation

## 2017-06-23 DIAGNOSIS — Z9114 Patient's other noncompliance with medication regimen: Secondary | ICD-10-CM | POA: Diagnosis not present

## 2017-06-23 DIAGNOSIS — I480 Paroxysmal atrial fibrillation: Secondary | ICD-10-CM | POA: Diagnosis not present

## 2017-06-23 DIAGNOSIS — I5042 Chronic combined systolic (congestive) and diastolic (congestive) heart failure: Secondary | ICD-10-CM

## 2017-06-23 DIAGNOSIS — Z8673 Personal history of transient ischemic attack (TIA), and cerebral infarction without residual deficits: Secondary | ICD-10-CM | POA: Insufficient documentation

## 2017-06-23 LAB — BASIC METABOLIC PANEL
ANION GAP: 10 (ref 5–15)
BUN: 19 mg/dL (ref 6–20)
CHLORIDE: 102 mmol/L (ref 101–111)
CO2: 24 mmol/L (ref 22–32)
Calcium: 9.7 mg/dL (ref 8.9–10.3)
Creatinine, Ser: 1.05 mg/dL (ref 0.61–1.24)
GFR calc Af Amer: >60 mL/min (ref >60)
GFR calc non Af Amer: >60 mL/min (ref >60)
GLUCOSE: 177 mg/dL — AB (ref 65–99)
POTASSIUM: 4.2 mmol/L (ref 3.5–5.1)
Sodium: 136 mmol/L (ref 135–145)

## 2017-06-23 MED ORDER — TORSEMIDE 20 MG PO TABS: 60.0000 mg | ORAL_TABLET | Freq: Every day | ORAL | 6 refills | Status: DC

## 2017-06-23 MED ORDER — EPLERENONE 25 MG PO TABS: 25.0000 mg | ORAL_TABLET | Freq: Every day | ORAL | 6 refills | Status: DC

## 2017-06-23 NOTE — Patient Instructions (Addendum)
Stop Midodrine Stop Spironolactone Start Eplerenone 25mg  (1 tablet) daily Continue Torsemide 60mg  (3 tablets) in the morning  Follow Up visit June 12 at 2pm

## 2017-06-23 NOTE — Progress Notes (Signed)
Primary Care: Internal Medicine clinic, Dr. Silverio Decamp Primary Cardiologist: Dr. Doylene Canard  GI: Dr Ardis Hughs.  Primary HF: Dr. Haroldine Laws   HPI:    [name] [name] is a 62 y.o. 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 Had history of acute renal failure in setting of rhabdo requiring short term HD.     Admitted with new onset HF in April 2014.   He was diuresed and discharged home.  Discharge weight 256 pounds. Echo at the time showed EF 66-29%, grade 1 diastolic dysfunction.    Admitted to [facility] 5/30 through 06/27/12 with GI bleed. EGD 06/25/12 multiple ulcers noted. Continued on protonix.   Admitted 8/2 through 09/03/15 with marked volume overload in the setting of medication compliance. Diuresed with IV lasix and transitioned to torsemide 60 mg daily. Hospital course complicated by elevated renal function.Clonidine was stopped on discharge and carvedilol was cut back to 6.25 mg twice a day. Discharge weight 272 pounds.    He was admitted 11/21-11/24 for Hyperosmolar hyperglycemic state after he had stopped taking all of his diabetes medications.    He was admitted 5/2-06/03/17 with DKA. He had stopped taking his medications. Insulin was adjusted. Echo showed EF 25-30%. His Entresto was DC'd with hypotension and he was started on midodrine. HF meds adjusted as tolerated. DC weight 280 lbs.   06/09/17 He presented  for post hospital follow up with APP. Overall doing well. He is sometimes SOB with walking on flat road, but not SOB with ADLs. Denies orthopnea/PND/edema. Denies CP, dizziness, palpitations. Good appetite. Energy level is good. No bleeding on xarelto. He has been compliant with medications since discharge. Weights ~ 280 lbs at home. He has taken one extra torsemide since  discharge for SOB. He has not checked BP at home.   ECHO 10/27/12 EF 40-45%  ECHO 06/04/15 LVEF 20-25%, decreased diastolic compliance, LAE, RV severely reduced.  ECHO 11/26/15 EF 30%  Echo 12/26/15 EF 30-35% RV ok.  ECHO 05/31/17 EF 25-30%  He presents today for initial Pharmacist medication titration clinic.  At last appointment his midodrine was discontinued because BP was stable - but he has continues it BP elevated today 138/98 will stop midodriine.  Also complains of breast tenderness - possibly spironolactone. He also has been taking torsemide 60mg  daily due to weight gain and SOB.  Plans to restart Entresto as BP allows.    . Shortness of breath/dyspnea on exertion? yes  - with walking but not at rest  . Orthopnea/PND?  yes . Edema? no . Lightheadedness/dizziness? no . Daily weights at home? no . Blood pressure/heart rate monitoring at home? no . Following low-sodium/fluid-restricted diet? No - eats lots hot sauce   HF Medications: Carvedilol 3.125mg  BID Spironolactone 12.5mg  Daily Torsemide 40mg  daily with extra 20mg  in evening if needed for weight gain  Has the patient been experiencing any side effects to the medications prescribed?  no  Does the patient have any problems obtaining medications due to transportation or finances?   no  Understanding of regimen: fair Understanding of indications: fair Potential of compliance: fair Patient understands to avoid NSAIDs. Patient understands to avoid decongestants.    Pertinent Lab Values: . Serum creatinine 1.05 stable, CO2 24, Potassium 4.2 slight hemolysis, Sodium 136,   Vital Signs: . Weight: 276.4lb (dry weight: about 280lb at  home) . Blood pressure: 138/98 . Heart rate: 107 . O2 97%  Assessment: 1. Chronicsystolic CHF (EF 77%), due to NICM. NYHA class IIIsymptoms.  He gets SOB with walking around home or outside, no SOB at rest. -Volume status stable on torsemide 60mg  daily but weight and abdominal girth increases  if he only takes torsemide 40mg  daily.  Renal function and K stable  - continue current - Medication changes today - Stop midodrine -was supposed to stop after last appointment - BP now elevated 140/100, complains of breast tenderness - will change spironolactone to eplerenone and increase 12.5>25mg  daily. -Continue carvediolol 3.125mg  BID - HR tachy today but making other medication changes so will not increase this today.- - Basic disease state pathophysiology, medication indication, mechanism and side effects reviewed at length with patient and he verbalized understanding  2) HTN - Elevated was to  stop midodrine after last appointment - will stop now. 3) PAF:  - Continue Xarelto 20 mg daily. Denies bleeding. 4) ETOH abuse - Abstinent. No change 5) CKD stage II - BMET today 6) ?OSA - Missed appt for sleep study. Need to encourage him to reschedule next visit.  7) DMII - Per PCP.  8) Medication Noncompliance - Kennyth Lose, HF CSW will meet with him at next visit - He has been referred to HF Paramedicine program 9) Frequent PVCs while inpatient - 48 hour Holter to assess PVC burden    Plan: 1) Medication changes: Based on clinical presentation, renal function stable and K ok,  vital signs - BP elevated stop midodrine complais of breast tenderness - stop spironolactone 12.5mg  daily Start Eplerenone 25mg  daily  2) Labs: with next visit  3) Follow-up: with extender clinic 07/07/17 at [place].D. CPP, BCPS Clinical Pharmacist [phone] 06/23/2017 3:08 PM

## 2017-06-30 ENCOUNTER — Telehealth (HOSPITAL_COMMUNITY): Payer: Self-pay | Admitting: Surgery

## 2017-06-30 NOTE — Telephone Encounter (Signed)
Patient will be discharged from the [facility] secondary to the fact that he refuses the service at this time.

## 2017-07-07 ENCOUNTER — Encounter (HOSPITAL_COMMUNITY): Payer: Self-pay

## 2017-07-07 ENCOUNTER — Ambulatory Visit (HOSPITAL_COMMUNITY)
Admission: RE | Admit: 2017-07-07 | Discharge: 2017-07-07 | Disposition: A | Discharge status: Home / Self Care | Payer: Medicare Other | Source: Ambulatory Visit | Attending: Internal Medicine | Admitting: Internal Medicine

## 2017-07-07 VITALS — BP 152/92 | HR 100 | Wt 285.0 lb

## 2017-07-07 DIAGNOSIS — I480 Paroxysmal atrial fibrillation: Secondary | ICD-10-CM | POA: Diagnosis not present

## 2017-07-07 DIAGNOSIS — Z9114 Patient's other noncompliance with medication regimen: Secondary | ICD-10-CM | POA: Insufficient documentation

## 2017-07-07 DIAGNOSIS — Z794 Long term (current) use of insulin: Secondary | ICD-10-CM | POA: Insufficient documentation

## 2017-07-07 DIAGNOSIS — F209 Schizophrenia, unspecified: Secondary | ICD-10-CM | POA: Diagnosis not present

## 2017-07-07 DIAGNOSIS — E1122 Type 2 diabetes mellitus with diabetic chronic kidney disease: Secondary | ICD-10-CM | POA: Insufficient documentation

## 2017-07-07 DIAGNOSIS — B1920 Unspecified viral hepatitis C without hepatic coma: Secondary | ICD-10-CM | POA: Diagnosis not present

## 2017-07-07 DIAGNOSIS — Z8673 Personal history of transient ischemic attack (TIA), and cerebral infarction without residual deficits: Secondary | ICD-10-CM | POA: Diagnosis not present

## 2017-07-07 DIAGNOSIS — K219 Gastro-esophageal reflux disease without esophagitis: Secondary | ICD-10-CM | POA: Diagnosis not present

## 2017-07-07 DIAGNOSIS — Z8546 Personal history of malignant neoplasm of prostate: Secondary | ICD-10-CM | POA: Insufficient documentation

## 2017-07-07 DIAGNOSIS — F1410 Cocaine abuse, uncomplicated: Secondary | ICD-10-CM | POA: Diagnosis not present

## 2017-07-07 DIAGNOSIS — I5042 Chronic combined systolic (congestive) and diastolic (congestive) heart failure: Secondary | ICD-10-CM | POA: Diagnosis not present

## 2017-07-07 DIAGNOSIS — Z87891 Personal history of nicotine dependence: Secondary | ICD-10-CM | POA: Insufficient documentation

## 2017-07-07 DIAGNOSIS — I493 Ventricular premature depolarization: Secondary | ICD-10-CM | POA: Insufficient documentation

## 2017-07-07 DIAGNOSIS — E785 Hyperlipidemia, unspecified: Secondary | ICD-10-CM | POA: Diagnosis not present

## 2017-07-07 DIAGNOSIS — K760 Fatty (change of) liver, not elsewhere classified: Secondary | ICD-10-CM | POA: Diagnosis not present

## 2017-07-07 DIAGNOSIS — E781 Pure hyperglyceridemia: Secondary | ICD-10-CM | POA: Insufficient documentation

## 2017-07-07 DIAGNOSIS — Z79899 Other long term (current) drug therapy: Secondary | ICD-10-CM | POA: Diagnosis not present

## 2017-07-07 DIAGNOSIS — Z7901 Long term (current) use of anticoagulants: Secondary | ICD-10-CM | POA: Diagnosis not present

## 2017-07-07 DIAGNOSIS — N182 Chronic kidney disease, stage 2 (mild): Secondary | ICD-10-CM | POA: Insufficient documentation

## 2017-07-07 DIAGNOSIS — M48061 Spinal stenosis, lumbar region without neurogenic claudication: Secondary | ICD-10-CM | POA: Diagnosis not present

## 2017-07-07 DIAGNOSIS — I2510 Atherosclerotic heart disease of native coronary artery without angina pectoris: Secondary | ICD-10-CM | POA: Diagnosis not present

## 2017-07-07 DIAGNOSIS — I130 Hypertensive heart and chronic kidney disease with heart failure and stage 1 through stage 4 chronic kidney disease, or unspecified chronic kidney disease: Secondary | ICD-10-CM | POA: Insufficient documentation

## 2017-07-07 DIAGNOSIS — I429 Cardiomyopathy, unspecified: Secondary | ICD-10-CM | POA: Insufficient documentation

## 2017-07-07 DIAGNOSIS — Z9119 Patient's noncompliance with other medical treatment and regimen: Secondary | ICD-10-CM

## 2017-07-07 DIAGNOSIS — Z7902 Long term (current) use of antithrombotics/antiplatelets: Secondary | ICD-10-CM | POA: Insufficient documentation

## 2017-07-07 DIAGNOSIS — Z91199 Patient's noncompliance with other medical treatment and regimen due to unspecified reason: Secondary | ICD-10-CM

## 2017-07-07 DIAGNOSIS — I950 Idiopathic hypotension: Secondary | ICD-10-CM | POA: Diagnosis not present

## 2017-07-07 LAB — BASIC METABOLIC PANEL
ANION GAP: 9 (ref 5–15)
BUN: 13 mg/dL (ref 6–20)
CHLORIDE: 107 mmol/L (ref 101–111)
CO2: 25 mmol/L (ref 22–32)
Calcium: 9.7 mg/dL (ref 8.9–10.3)
Creatinine, Ser: 0.99 mg/dL (ref 0.61–1.24)
GFR calc Af Amer: >60 mL/min (ref >60)
GFR calc non Af Amer: >60 mL/min (ref >60)
GLUCOSE: 147 mg/dL — AB (ref 65–99)
POTASSIUM: 3.5 mmol/L (ref 3.5–5.1)
Sodium: 141 mmol/L (ref 135–145)

## 2017-07-07 MED ORDER — LOSARTAN POTASSIUM 25 MG PO TABS: 25.0000 mg | ORAL_TABLET | Freq: Every day | ORAL | 11 refills | Status: DC

## 2017-07-07 MED ORDER — ROSUVASTATIN CALCIUM 40 MG PO TABS: 40.0000 mg | ORAL_TABLET | Freq: Every day | ORAL | 3 refills | Status: AC

## 2017-07-07 NOTE — Progress Notes (Signed)
Patient ID: [name] [name], male   DOB: 1955/03/17, 62 y.o.   MRN: [id number]    Advanced Heart Failure Clinic Note   Primary Care: Internal Medicine clinic, Dr. Silverio Decamp Primary Cardiologist: Dr. Doylene Canard  GI: Dr Ardis Hughs.  Primary HF: Dr. Haroldine Laws   HPI: [name] [name] is a 62 y.o. 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 Had history of acute renal failure in setting of rhabdo requiring short term HD.     Admitted with new onset HF in April 2014.   He was diuresed and discharged home.  Discharge weight 256 pounds. Echo at the time showed EF 27-06%, grade 1 diastolic dysfunction.    Admitted to [facility] 5/30 through 06/27/12 with GI bleed. EGD 06/25/12 multiple ulcers noted. Continued on protonix.   Admitted 8/2 through 09/03/15 with marked volume overload in the setting of medication compliance. Diuresed with IV lasix and transitioned to torsemide 60 mg daily. Hospital course complicated by elevated renal function.Clonidine was stopped on discharge and carvedilol was cut back to 6.25 mg twice a day. Discharge weight 272 pounds.    He was admitted 11/21-11/24 for Hyperosmolar hyperglycemic state after he had stopped taking all of his diabetes medications.    He was admitted 5/2-06/03/17 with DKA. He had stopped taking his medications. Insulin was adjusted. Echo showed EF 25-30%. His Entresto was DC'd with hypotension and he was started on midodrine. HF meds adjusted as tolerated. DC weight 280 lbs.   He presents today for regular follow up. Last visit, midodrine was stopped. Arlyce Harman was switched to eplerenone and increased to 25 mg daily (pt had breast tenderness). Overall doing okay. Denies SOB, orthopnea, PND, or edema. Denies dizziness, palpitations, or cough. No bleeding on  xarelto. He is limiting fluid and salt intake. Weights 280-285 lbs at home. He occasionally misses his medications, but is unable to quantify how often. Refused HF paramedicine.   ECHO 10/27/12 EF 40-45%  ECHO 06/04/15 LVEF 20-25%, decreased diastolic compliance, LAE, RV severely reduced.  ECHO 11/26/15 EF 30%  Echo 12/26/15 EF 30-35% RV ok.  ECHO 05/31/17 EF 25-30%  RHC 08/2015 RA = 7 RV = 35/10 PA = 37/15 (25) PCW = 10 Fick cardiac output/index = 4.3/1.7 PVR = 3.5 WU Ao sat = 95% PA sat = 58%, 58% Assessment: 1. Volume status looks good. 2. Cardiac output depressed  2014 LHC -  Left main: Normal LAD: Large vessel gives of 2 diagonals. Normal LCX: Very large vessel with 3 OMs. 20-30% plaque in midsection.  RCA: Dominant. Normal LV-gram done in the RAO projection: Ejection fraction = 35-40% global HK Assessment: 1. Minimal CAD  Labs 02/12/13 K 4.3 Creatinine 0.98          08/2013: K 3.9, creatinine 1.27, Mag 1.8, pro-BNP 392, Troponin 0.05          06/07/15: K 4.2, creatinine 1.21          07/10/2015: K 3.9 Creatinine 1.16           09/03/2015: K 4.7 Creatinine 1.32          09/11/2015: K 4.1 Creatinine 1.29   SH: Quit smoking 9 years ago. Lives alone. Quit ETOH 09/25/13 FH: Mom died from MI in her early 14s  Brother MI and deceased. Does not know about fathers health  history.   Review of systems complete and found to be negative unless listed in HPI.   Past Medical History:  Diagnosis Date  . Atrial fibrillation ([place])   . Cancer [facility])    Prostate cancer-bx. 3 weeks ago  . Cataract   . Chronic combined systolic and diastolic CHF (congestive heart failure) (HCC)    a) EF 40-45% per 2D echo (02/2012) with grade 1 DD b)  NICM c) RHC (04/2012): RA: 4, RV 45/3/4, PA 42/9 (24), PCWP 14, Fick CO/CI: 5.2 /2.2, PVR 1.9 WU, PA 62% and 64% d) ECHO (10/2012) EF 40-45%, grade II DD, RV nl  . CKD (chronic kidney disease) stage 2, GFR 60-89 ml/min    BL SCr approximately 1-1.3  . Colitis  05/2009   History of colitis of ascending colon noted on CT abd/pelvis (05/2009), with interval resolution with subsequent CT  . Continuous chronic alcoholism ([place])   . Degenerative lumbar spinal stenosis    s/p L2-3, L3-4, L4-5 laminectomy partial facetectomy, and bilateral foraminotomy  . Diabetes mellitus without complication ([place])    Type II  . Dysrhythmia    A. Fib  . Family history of adverse reaction to anesthesia    "sister, can't go to sleep"  . GERD (gastroesophageal reflux disease)   . H/O cocaine abuse   . Hepatic steatosis    suspected 2/2 alcohol abuse  . Hepatitis C   . History of pancreatitis 01/2011   Admission for acute pancreatitis presumed 2/2 ongoing alcohol abuse- and hypertriglyceridemia  . History of pneumonia   . HIT (heparin-induced thrombocytopenia) ([place])   . Hyperlipidemia   . Hypertension    uncontrolled with medication noncompliance  . Hypertriglyceridemia   . NICM (nonischemic cardiomyopathy) ([place])    a. LHC (04/2012): nl arteries  . PFO (patent foramen ovale) 01/2012   with right to left shunt, noted per TEE in evaluation for source of embolic stroke in 03/3823  . Rhabdomyolysis 02/22/2012   H/O rhabdomyolysis in 01/2012 that was idiopathic, cause never identified  . Schizophrenia, schizo-affective ([place])   . Shortness of breath dyspnea   . Splenic cyst   . Stroke ([place]) 01/2012   Small cerebellar infarcts right greater than left as well as questionable acute left external capsule and caudate nuclear punctate lacunar infarcts noted per MRI (01/2012) - presumed to be embolic likely source PFO with right to left shunt (noted per TEE 01/ 2014)    Current Outpatient Medications  Medication Sig Dispense Refill  . ACCU-CHEK FASTCLIX LANCETS MISC Check blood sugar 4 times a day before meals and bedtime 204 each 5  . B-D UF III MINI PEN NEEDLES 31G X 5 MM MISC Use four times daily as directed. Dx code E11.00, Z79.4 100 each 3  . Blood Glucose Monitoring  Suppl (ACCU-CHEK GUIDE) w/Device KIT 1 each by Does not apply route 4 (four) times daily. 1 kit 1  . cariprazine (VRAYLAR) capsule Take 3 mg by mouth daily.    . carvedilol (COREG) 3.125 MG tablet Take 1 tablet (3.125 mg total) by mouth 2 (two) times daily with a meal. 60 tablet 6  . eplerenone (INSPRA) 25 MG tablet Take 1 tablet (25 mg total) by mouth daily. 30 tablet 6  . glucose blood (ACCU-CHEK GUIDE) test strip Check blood sugar 4 times a day before meal and bedtime 150 each 5  . Insulin Glargine (LANTUS SOLOSTAR) 100 UNIT/ML Solostar Pen Inject 30 Units into the skin 2 (two) times daily. 18 mL 0  .  insulin regular (NOVOLIN R,HUMULIN R) 100 units/mL injection 3 times with meals.Take 30 minutes before meal. (Patient taking differently: Inject 18 Units into the skin 3 (three) times daily before meals. ) 15 mL 1  . QUEtiapine (SEROQUEL) 400 MG tablet Take 1 tablet (400 mg total) by mouth 2 (two) times daily for 21 days. 42 tablet 0  . rivaroxaban (XARELTO) 20 MG TABS tablet Take 1 tablet (20 mg total) by mouth daily. 30 tablet 6  . torsemide (DEMADEX) 20 MG tablet Take 3 tablets (60 mg total) by mouth daily. 90 tablet 6   No current facility-administered medications for this encounter.     Allergies  Allergen Reactions  . Heparin Other (See Comments)    Documented HIT under problem list Pt states he's not allergic. " They told me not take it anymore"  . Thorazine [Chlorpromazine Hcl] Other (See Comments)    Body freezes up     Vitals:   07/07/17 1400  BP: (!) 152/92  Pulse: 100  SpO2: 96%  Weight: 285 lb (129.3 kg)   Wt Readings from Last 3 Encounters:  07/07/17 285 lb (129.3 kg)  06/23/17 276 lb 6.4 oz (125.4 kg)  06/09/17 281 lb (127.5 kg)     PHYSICAL EXAM: General: No resp difficulty. HEENT: Normal Neck: Supple. JVP 7-8. Carotids 2+ bilat; no bruits. No thyromegaly or nodule noted. Cor: PMI nondisplaced. IRR, No M/G/R noted Lungs: CTAB, normal effort. Abdomen: Soft,  non-tender, non-distended, no HSM. No bruits or masses. +BS  Extremities: No cyanosis, clubbing, or rash. R and LLE 1+ edema Neuro: Alert & orientedx3, cranial nerves grossly intact. moves all 4 extremities w/o difficulty. Affect pleasant  EKG: NSR with 1st degree AV block with occasional PVCs and PACs, 94 bpm  ASSESSMENT /PLAN 1) Chronic combined systolic/diastolic HF: EF 32-20%, grade II DD (10/2012) now decreased to 20-25%  (05/2015) RV severely reduced. Echo 4/19 EF 30-35% RV ok. Had [place] 2014 with minimal CAD.  - Echo 05/31/17 EF 25-30%, RV normal - NYHA II-III. Volume status mildly elevated in setting of medication noncompliance. - Continue torsemide 40 mg daily. Take an extra 20 mg today. - Continue eplerenone 25 mg daily (breast tenderness with spiro, now resolved) - Continue carvedilol 3.125 mg BID - Entresto DCd with hypotension during recent admission. Start losartan 25 mg daily. Will likely be able to switch back to [facility] next visit.  - Since he had been off medications, will wait to repeat Echo after he has been compliant. If EF remains < 35% on optimal therapy, will need to refer for ICD. Schedule Echo for August.  - Reinforced fluid restriction to < 2 L daily, sodium restriction to less than 2000 mg daily, and the importance of daily weights.   - Holter showed < 3% PVC burden.  - Stressed the importance of taking all medications every day.  - Refer for sleep study - Refused HF paramedicine.   2) HTN - Elevated today. He took all his medication and does not check BP at home. Start losartan as above.  3) PAF:  - NSR with PACs and PVCs on EKG today.  - Continue Xarelto 20 mg daily. Denies bleeding.  4) ETOH abuse - Abstinent. No change.  5) CKD stage II - BMET today 6) ?OSA - Needs to reschedule sleep study. Will re-refer today.  7) DMII - Per PCP.  8) Medication Noncompliance - He has been referred to HF Paramedicine program, but declined when called to set up a  visit.    9) Frequent PVCs while inpatient - Holter monitor 06/17/17: 3% PVCs 10) Hyperlipidemia - Continue crestor. Sent in refill for him today.   BMET today Refer for sleep study Schedule Echo for August Start losartan 25 mg daily Follow up in 4 weeks  Georgiana Shore, NP  2:11 PM  Greater than 50% of the 25 minute visit was spent in counseling/coordination of care regarding disease state education, salt/fluid restriction, sliding scale diuretics, and medication compliance.

## 2017-07-07 NOTE — Patient Instructions (Signed)
Routine lab work today. Will notify you of abnormal results, otherwise no news is good news!  RESTART Crestor 40 mg tablet once daily.  START Losartan 25 mg tablet once daily.  Will schedule you for sleep study at [facility]. Address: [address], [place], [place] [address] Phone: [phone] Their office will call you to schedule.  Follow up 4 weeks.  _____________________________________________________________________ [name] [name] Code: 1400  Follow up 2 months with Dr. Haroldine Laws and echocardiogram  _____________________________________________________________________ [name] [name] Code: 1500  Take all medication as prescribed the day of your appointment. Bring all medications with you to your appointment.  Do the following things EVERYDAY: 1) Weigh yourself in the morning before breakfast. Write it down and keep it in a log. 2) Take your medicines as prescribed 3) Eat low salt foods-Limit salt (sodium) to 2000 mg per day.  4) Stay as active as you can everyday 5) Limit all fluids for the day to less than 2 liters

## 2017-08-03 NOTE — Progress Notes (Signed)
Patient ID: [name] [name], male   DOB: [birth year], 62 y.o.   MRN: [id number]    Advanced Heart Failure Clinic Note   Primary Care: Internal Medicine clinic, Dr. Silverio Decamp Primary Cardiologist: Dr. Doylene Canard  GI: Dr Ardis Hughs.  Primary HF: Dr. Haroldine Laws   HPI: [name] [name] is a 62 y.o. 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 Had history of acute renal failure in setting of rhabdo requiring short term HD.     Admitted with new onset HF in April 2014.   He was diuresed and discharged home.  Discharge weight 256 pounds. Echo at the time showed EF 40-98%, grade 1 diastolic dysfunction.    Admitted to [facility] 5/30 through 06/27/12 with GI bleed. EGD 06/25/12 multiple ulcers noted. Continued on protonix.   Admitted 8/2 through 09/03/15 with marked volume overload in the setting of medication compliance. Diuresed with IV lasix and transitioned to torsemide 60 mg daily. Hospital course complicated by elevated renal function.Clonidine was stopped on discharge and carvedilol was cut back to 6.25 mg twice a day. Discharge weight 272 pounds.    He was admitted 11/21-11/24 for Hyperosmolar hyperglycemic state after he had stopped taking all of his diabetes medications.    He was admitted 5/2-06/03/17 with DKA. He had stopped taking his medications. Insulin was adjusted. Echo showed EF 25-30%. His Entresto was DC'd with hypotension and he was started on midodrine. HF meds adjusted as tolerated. DC weight 280 lbs.   He presents today for regular follow up. Last visit, losartan was started. Overall doing well. He is SOB after walking about 50 feet. No SOB with ADLs. He has chronic orthopnea. Denies PND or edema. No CP, dizziness, or palpitations. No bleeding on xarelto. He is limiting his salt  intake, but drinking about 2 L of fluid. He has not missed any medication doses. He does not want HF paramedicine. Weights ~285 lbs at home.   ECHO 10/27/12 EF 40-45%  ECHO 06/04/15 LVEF 20-25%, decreased diastolic compliance, LAE, RV severely reduced.  ECHO 11/26/15 EF 30%  Echo 12/26/15 EF 30-35% RV ok.  ECHO 05/31/17 EF 25-30%  RHC 08/2015 RA = 7 RV = 35/10 PA = 37/15 (25) PCW = 10 Fick cardiac output/index = 4.3/1.7 PVR = 3.5 WU Ao sat = 95% PA sat = 58%, 58% Assessment: 1. Volume status looks good. 2. Cardiac output depressed  2014 LHC -  Left main: Normal LAD: Large vessel gives of 2 diagonals. Normal LCX: Very large vessel with 3 OMs. 20-30% plaque in midsection.  RCA: Dominant. Normal LV-gram done in the RAO projection: Ejection fraction = 35-40% global HK Assessment: 1. Minimal CAD  Labs 02/12/13 K 4.3 Creatinine 0.98          08/2013: K 3.9, creatinine 1.27, Mag 1.8, pro-BNP 392, Troponin 0.05          06/07/15: K 4.2, creatinine 1.21          07/10/2015: K 3.9 Creatinine 1.16           09/03/2015: K 4.7 Creatinine 1.32          09/11/2015: K 4.1 Creatinine 1.29   SH: Quit smoking 9 years ago. Lives alone. Quit ETOH 09-Oct-2013 FH: Mom died from MI in her early 83s  Brother MI and deceased. Does not know  about fathers health history.   Review of systems complete and found to be negative unless listed in HPI.   Past Medical History:  Diagnosis Date  . Atrial fibrillation ([place])   . Cancer [facility])    Prostate cancer-bx. 3 weeks ago  . Cataract   . Chronic combined systolic and diastolic CHF (congestive heart failure) (HCC)    a) EF 40-45% per 2D echo (02/2012) with grade 1 DD b)  NICM c) RHC (04/2012): RA: 4, RV 45/3/4, PA 42/9 (24), PCWP 14, Fick CO/CI: 5.2 /2.2, PVR 1.9 WU, PA 62% and 64% d) ECHO (10/2012) EF 40-45%, grade II DD, RV nl  . CKD (chronic kidney disease) stage 2, GFR 60-89 ml/min    BL SCr approximately 1-1.3  . Colitis 05/2009   History of colitis of  ascending colon noted on CT abd/pelvis (05/2009), with interval resolution with subsequent CT  . Continuous chronic alcoholism ([place])   . Degenerative lumbar spinal stenosis    s/p L2-3, L3-4, L4-5 laminectomy partial facetectomy, and bilateral foraminotomy  . Diabetes mellitus without complication ([place])    Type II  . Dysrhythmia    A. Fib  . Family history of adverse reaction to anesthesia    "sister, can't go to sleep"  . GERD (gastroesophageal reflux disease)   . H/O cocaine abuse   . Hepatic steatosis    suspected 2/2 alcohol abuse  . Hepatitis C   . History of pancreatitis 01/2011   Admission for acute pancreatitis presumed 2/2 ongoing alcohol abuse- and hypertriglyceridemia  . History of pneumonia   . HIT (heparin-induced thrombocytopenia) ([place])   . Hyperlipidemia   . Hypertension    uncontrolled with medication noncompliance  . Hypertriglyceridemia   . NICM (nonischemic cardiomyopathy) ([place])    a. LHC (04/2012): nl arteries  . PFO (patent foramen ovale) 01/2012   with right to left shunt, noted per TEE in evaluation for source of embolic stroke in 09/8336  . Rhabdomyolysis 02/22/2012   H/O rhabdomyolysis in 01/2012 that was idiopathic, cause never identified  . Schizophrenia, schizo-affective ([place])   . Shortness of breath dyspnea   . Splenic cyst   . Stroke ([place]) 01/2012   Small cerebellar infarcts right greater than left as well as questionable acute left external capsule and caudate nuclear punctate lacunar infarcts noted per MRI (01/2012) - presumed to be embolic likely source PFO with right to left shunt (noted per TEE 01/ 2014)    Current Outpatient Medications  Medication Sig Dispense Refill  . carvedilol (COREG) 3.125 MG tablet Take 1 tablet (3.125 mg total) by mouth 2 (two) times daily with a meal. 60 tablet 6  . eplerenone (INSPRA) 25 MG tablet Take 1 tablet (25 mg total) by mouth daily. 30 tablet 6  . losartan (COZAAR) 25 MG tablet Take 1 tablet (25 mg total) by  mouth daily. 30 tablet 11  . rivaroxaban (XARELTO) 20 MG TABS tablet Take 1 tablet (20 mg total) by mouth daily. 30 tablet 6  . rosuvastatin (CRESTOR) 40 MG tablet Take 1 tablet (40 mg total) by mouth daily. 90 tablet 3  . torsemide (DEMADEX) 20 MG tablet Take 3 tablets (60 mg total) by mouth daily. 90 tablet 6  . ACCU-CHEK FASTCLIX LANCETS MISC Check blood sugar 4 times a day before meals and bedtime 204 each 5  . B-D UF III MINI PEN NEEDLES 31G X 5 MM MISC Use four times daily as directed. Dx code E11.00, Z79.4 100 each 3  .  Blood Glucose Monitoring Suppl (ACCU-CHEK GUIDE) w/Device KIT 1 each by Does not apply route 4 (four) times daily. 1 kit 1  . cariprazine (VRAYLAR) capsule Take 3 mg by mouth daily.    Marland Kitchen glucose blood (ACCU-CHEK GUIDE) test strip Check blood sugar 4 times a day before meal and bedtime 150 each 5  . Insulin Glargine (LANTUS SOLOSTAR) 100 UNIT/ML Solostar Pen Inject 30 Units into the skin 2 (two) times daily. 18 mL 0  . insulin regular (NOVOLIN R,HUMULIN R) 100 units/mL injection 3 times with meals.Take 30 minutes before meal. (Patient taking differently: Inject 18 Units into the skin 3 (three) times daily before meals. ) 15 mL 1  . QUEtiapine (SEROQUEL) 400 MG tablet Take 1 tablet (400 mg total) by mouth 2 (two) times daily for 21 days. 42 tablet 0   No current facility-administered medications for this encounter.     Allergies  Allergen Reactions  . Heparin Other (See Comments)    Documented HIT under problem list Pt states he's not allergic. " They told me not take it anymore"  . Spironolactone     Breast tenderness  . Thorazine [Chlorpromazine Hcl] Other (See Comments)    Body freezes up     Vitals:   08/04/17 1440  BP: 138/90  Pulse: 78  SpO2: 95%  Weight: 291 lb 12.8 oz (132.4 kg)   Wt Readings from Last 3 Encounters:  08/04/17 291 lb 12.8 oz (132.4 kg)  07/07/17 285 lb (129.3 kg)  06/23/17 276 lb 6.4 oz (125.4 kg)     PHYSICAL EXAM: General: No  resp difficulty. HEENT: Normal Neck: Supple. JVP ~8. Carotids 2+ bilat; no bruits. No thyromegaly or nodule noted. Cor: PMI nondisplaced. RRR, No M/G/R noted Lungs: CTAB, normal effort. Abdomen: Soft, non-tender, +distended, no HSM. No bruits or masses. +BS  Extremities: No cyanosis, clubbing, or rash. R and LLE no edema.  Neuro: Alert & orientedx3, cranial nerves grossly intact. moves all 4 extremities w/o difficulty. Affect pleasant   ASSESSMENT /PLAN 1) Chronic combined systolic/diastolic HF: EF 35-32%, grade II DD (10/2012) now decreased to 20-25%  (05/2015) RV severely reduced. Echo 4/19 EF 30-35% RV ok. Had [place] 2014 with minimal CAD. Holter showed <3% PVC burden.  - Echo 05/31/17 EF 25-30%, RV normal - NYHA III. Volume status mildly elevated.  - Continue torsemide 40 mg daily. - Continue eplerenone 25 mg daily (breast tenderness with spiro, now resolved) - Continue carvedilol 3.125 mg BID - Stop losartan. Start Entresto 24/26 mg BID. BMET today and in 7 days.  - Since he had been off medications, will wait to repeat Echo after he has been compliant x 3-4 months. If EF remains < 35% on optimal therapy, will need to refer for ICD. Echo scheduled for August.  - Reinforced fluid restriction to < 2 L daily, sodium restriction to less than 2000 mg daily, and the importance of daily weights.   - Stressed the importance of taking all medications every day. Refuses HF paramedicine.   2) HTN - Elevated today. Starting [facility] as above.  3) PAF:  - Continue Xarelto 20 mg daily. Denies bleeding.  - Regular on exam today 4) ETOH abuse - Abstinent. No change.  5) CKD stage II - BMET today 6) ?OSA - Referred for sleep study last visit. He has not heard from them. Provided phone number today.  7) DMII - Per PCP. No change.  8) Medication Noncompliance - He has been referred to HF Paramedicine program,  but declined when called to set up a visit. No change.  9) Frequent PVCs while inpatient -  Holter monitor 06/17/17: 3% PVCs. No change 10) Hyperlipidemia - Continue crestor. No change  BMET today and in 7 days D/C losartan Start Entresto 24/26 mg BID Keep scheduled appointment with Dr Haroldine Laws with Echo  Georgiana Shore, NP  2:51 PM  Greater than 50% of the 25 minute visit was spent in counseling/coordination of care regarding disease state education, salt/fluid restriction, sliding scale diuretics, and medication compliance.

## 2017-08-04 ENCOUNTER — Ambulatory Visit (HOSPITAL_COMMUNITY)
Admission: RE | Admit: 2017-08-04 | Discharge: 2017-08-04 | Disposition: A | Discharge status: Home / Self Care | Payer: Medicare Other | Source: Ambulatory Visit | Attending: Cardiology | Admitting: Cardiology

## 2017-08-04 ENCOUNTER — Telehealth (HOSPITAL_COMMUNITY): Payer: Self-pay | Admitting: *Deleted

## 2017-08-04 ENCOUNTER — Encounter (HOSPITAL_COMMUNITY): Payer: Self-pay

## 2017-08-04 VITALS — BP 138/90 | HR 78 | Wt 291.8 lb

## 2017-08-04 DIAGNOSIS — I130 Hypertensive heart and chronic kidney disease with heart failure and stage 1 through stage 4 chronic kidney disease, or unspecified chronic kidney disease: Secondary | ICD-10-CM | POA: Diagnosis present

## 2017-08-04 DIAGNOSIS — I10 Essential (primary) hypertension: Secondary | ICD-10-CM

## 2017-08-04 DIAGNOSIS — Z7901 Long term (current) use of anticoagulants: Secondary | ICD-10-CM | POA: Diagnosis not present

## 2017-08-04 DIAGNOSIS — I429 Cardiomyopathy, unspecified: Secondary | ICD-10-CM | POA: Diagnosis not present

## 2017-08-04 DIAGNOSIS — M48061 Spinal stenosis, lumbar region without neurogenic claudication: Secondary | ICD-10-CM | POA: Insufficient documentation

## 2017-08-04 DIAGNOSIS — Z8673 Personal history of transient ischemic attack (TIA), and cerebral infarction without residual deficits: Secondary | ICD-10-CM | POA: Diagnosis not present

## 2017-08-04 DIAGNOSIS — Z8249 Family history of ischemic heart disease and other diseases of the circulatory system: Secondary | ICD-10-CM | POA: Diagnosis not present

## 2017-08-04 DIAGNOSIS — N182 Chronic kidney disease, stage 2 (mild): Secondary | ICD-10-CM

## 2017-08-04 DIAGNOSIS — Z87891 Personal history of nicotine dependence: Secondary | ICD-10-CM | POA: Diagnosis not present

## 2017-08-04 DIAGNOSIS — Z8546 Personal history of malignant neoplasm of prostate: Secondary | ICD-10-CM | POA: Insufficient documentation

## 2017-08-04 DIAGNOSIS — G4733 Obstructive sleep apnea (adult) (pediatric): Secondary | ICD-10-CM | POA: Diagnosis not present

## 2017-08-04 DIAGNOSIS — Z888 Allergy status to other drugs, medicaments and biological substances status: Secondary | ICD-10-CM | POA: Insufficient documentation

## 2017-08-04 DIAGNOSIS — Z79899 Other long term (current) drug therapy: Secondary | ICD-10-CM | POA: Insufficient documentation

## 2017-08-04 DIAGNOSIS — Z9114 Patient's other noncompliance with medication regimen: Secondary | ICD-10-CM | POA: Insufficient documentation

## 2017-08-04 DIAGNOSIS — K219 Gastro-esophageal reflux disease without esophagitis: Secondary | ICD-10-CM | POA: Diagnosis not present

## 2017-08-04 DIAGNOSIS — Z794 Long term (current) use of insulin: Secondary | ICD-10-CM | POA: Insufficient documentation

## 2017-08-04 DIAGNOSIS — F209 Schizophrenia, unspecified: Secondary | ICD-10-CM | POA: Insufficient documentation

## 2017-08-04 DIAGNOSIS — E785 Hyperlipidemia, unspecified: Secondary | ICD-10-CM | POA: Diagnosis not present

## 2017-08-04 DIAGNOSIS — F1410 Cocaine abuse, uncomplicated: Secondary | ICD-10-CM | POA: Diagnosis not present

## 2017-08-04 DIAGNOSIS — E781 Pure hyperglyceridemia: Secondary | ICD-10-CM | POA: Insufficient documentation

## 2017-08-04 DIAGNOSIS — I2510 Atherosclerotic heart disease of native coronary artery without angina pectoris: Secondary | ICD-10-CM | POA: Diagnosis not present

## 2017-08-04 DIAGNOSIS — F1010 Alcohol abuse, uncomplicated: Secondary | ICD-10-CM | POA: Insufficient documentation

## 2017-08-04 DIAGNOSIS — I480 Paroxysmal atrial fibrillation: Secondary | ICD-10-CM | POA: Diagnosis not present

## 2017-08-04 DIAGNOSIS — N179 Acute kidney failure, unspecified: Secondary | ICD-10-CM | POA: Insufficient documentation

## 2017-08-04 DIAGNOSIS — I5042 Chronic combined systolic (congestive) and diastolic (congestive) heart failure: Secondary | ICD-10-CM | POA: Insufficient documentation

## 2017-08-04 DIAGNOSIS — I493 Ventricular premature depolarization: Secondary | ICD-10-CM | POA: Insufficient documentation

## 2017-08-04 DIAGNOSIS — B1920 Unspecified viral hepatitis C without hepatic coma: Secondary | ICD-10-CM | POA: Insufficient documentation

## 2017-08-04 DIAGNOSIS — E1122 Type 2 diabetes mellitus with diabetic chronic kidney disease: Secondary | ICD-10-CM | POA: Insufficient documentation

## 2017-08-04 LAB — BASIC METABOLIC PANEL
Anion gap: 8 (ref 5–15)
BUN: 20 mg/dL (ref 8–23)
CO2: 27 mmol/L (ref 22–32)
Calcium: 9.8 mg/dL (ref 8.9–10.3)
Chloride: 104 mmol/L (ref 98–111)
Creatinine, Ser: 1 mg/dL (ref 0.61–1.24)
GFR calc Af Amer: >60 mL/min (ref >60)
GFR calc non Af Amer: >60 mL/min (ref >60)
Glucose, Bld: 162 mg/dL — ABNORMAL HIGH (ref 70–99)
Potassium: 3.8 mmol/L (ref 3.5–5.1)
Sodium: 139 mmol/L (ref 135–145)

## 2017-08-04 MED ORDER — SACUBITRIL-VALSARTAN 24-26 MG PO TABS: 1.0000 | ORAL_TABLET | Freq: Two times a day (BID) | ORAL | 3 refills | Status: DC

## 2017-08-04 NOTE — Telephone Encounter (Signed)
NO PA required for Entresto 24-26 mg.

## 2017-08-04 NOTE — Patient Instructions (Signed)
Labs today (will call for abnormal results, otherwise no news is good news)  STOP Losartan.  START Entresto 24-26 mg (1 Tablet) Twice Daily  Labs in 1 week (bmet)  Follow up as scheduled.  Please call [place] at [phone] to follow up on sleep study referral.

## 2017-08-11 ENCOUNTER — Ambulatory Visit (HOSPITAL_COMMUNITY)
Admission: RE | Admit: 2017-08-11 | Discharge: 2017-08-11 | Disposition: A | Discharge status: Home / Self Care | Payer: Medicare Other | Source: Ambulatory Visit | Attending: Cardiology | Admitting: Cardiology

## 2017-08-11 DIAGNOSIS — I5042 Chronic combined systolic (congestive) and diastolic (congestive) heart failure: Secondary | ICD-10-CM | POA: Diagnosis not present

## 2017-08-11 LAB — BASIC METABOLIC PANEL
Anion gap: 9 (ref 5–15)
BUN: 17 mg/dL (ref 8–23)
CALCIUM: 9.6 mg/dL (ref 8.9–10.3)
CO2: 25 mmol/L (ref 22–32)
CREATININE: 1.22 mg/dL (ref 0.61–1.24)
Chloride: 105 mmol/L (ref 98–111)
GFR calc non Af Amer: >60 mL/min (ref >60)
GLUCOSE: 266 mg/dL — AB (ref 70–99)
Potassium: 4.2 mmol/L (ref 3.5–5.1)
Sodium: 139 mmol/L (ref 135–145)

## 2017-09-07 ENCOUNTER — Inpatient Hospital Stay (HOSPITAL_COMMUNITY)
Admission: EM | Admit: 2017-09-07 | Discharge: 2017-09-13 | DRG: 291 | Disposition: A | Discharge status: Home Health | Payer: Medicare Other | Attending: Internal Medicine | Admitting: Internal Medicine

## 2017-09-07 ENCOUNTER — Encounter (HOSPITAL_COMMUNITY): Payer: Self-pay | Admitting: *Deleted

## 2017-09-07 ENCOUNTER — Encounter (HOSPITAL_COMMUNITY): Payer: Self-pay | Admitting: Internal Medicine

## 2017-09-07 ENCOUNTER — Other Ambulatory Visit: Payer: Self-pay

## 2017-09-07 ENCOUNTER — Emergency Department (HOSPITAL_COMMUNITY): Payer: Medicare Other

## 2017-09-07 ENCOUNTER — Ambulatory Visit (HOSPITAL_COMMUNITY): Payer: Medicare Other

## 2017-09-07 DIAGNOSIS — I509 Heart failure, unspecified: Secondary | ICD-10-CM

## 2017-09-07 DIAGNOSIS — N182 Chronic kidney disease, stage 2 (mild): Secondary | ICD-10-CM | POA: Diagnosis present

## 2017-09-07 DIAGNOSIS — Q211 Atrial septal defect: Secondary | ICD-10-CM | POA: Diagnosis not present

## 2017-09-07 DIAGNOSIS — Z888 Allergy status to other drugs, medicaments and biological substances status: Secondary | ICD-10-CM | POA: Diagnosis not present

## 2017-09-07 DIAGNOSIS — R778 Other specified abnormalities of plasma proteins: Secondary | ICD-10-CM | POA: Diagnosis present

## 2017-09-07 DIAGNOSIS — I5023 Acute on chronic systolic (congestive) heart failure: Secondary | ICD-10-CM | POA: Diagnosis not present

## 2017-09-07 DIAGNOSIS — I959 Hypotension, unspecified: Secondary | ICD-10-CM | POA: Diagnosis not present

## 2017-09-07 DIAGNOSIS — N183 Chronic kidney disease, stage 3 (moderate): Secondary | ICD-10-CM | POA: Diagnosis present

## 2017-09-07 DIAGNOSIS — E781 Pure hyperglyceridemia: Secondary | ICD-10-CM | POA: Diagnosis present

## 2017-09-07 DIAGNOSIS — Z79899 Other long term (current) drug therapy: Secondary | ICD-10-CM

## 2017-09-07 DIAGNOSIS — E785 Hyperlipidemia, unspecified: Secondary | ICD-10-CM | POA: Diagnosis present

## 2017-09-07 DIAGNOSIS — G4733 Obstructive sleep apnea (adult) (pediatric): Secondary | ICD-10-CM | POA: Diagnosis present

## 2017-09-07 DIAGNOSIS — Z8673 Personal history of transient ischemic attack (TIA), and cerebral infarction without residual deficits: Secondary | ICD-10-CM | POA: Diagnosis not present

## 2017-09-07 DIAGNOSIS — K219 Gastro-esophageal reflux disease without esophagitis: Secondary | ICD-10-CM | POA: Diagnosis present

## 2017-09-07 DIAGNOSIS — F209 Schizophrenia, unspecified: Secondary | ICD-10-CM | POA: Diagnosis present

## 2017-09-07 DIAGNOSIS — E1122 Type 2 diabetes mellitus with diabetic chronic kidney disease: Secondary | ICD-10-CM | POA: Diagnosis present

## 2017-09-07 DIAGNOSIS — I872 Venous insufficiency (chronic) (peripheral): Secondary | ICD-10-CM | POA: Diagnosis present

## 2017-09-07 DIAGNOSIS — Z961 Presence of intraocular lens: Secondary | ICD-10-CM | POA: Diagnosis present

## 2017-09-07 DIAGNOSIS — F1020 Alcohol dependence, uncomplicated: Secondary | ICD-10-CM | POA: Diagnosis present

## 2017-09-07 DIAGNOSIS — L089 Local infection of the skin and subcutaneous tissue, unspecified: Secondary | ICD-10-CM

## 2017-09-07 DIAGNOSIS — I5043 Acute on chronic combined systolic (congestive) and diastolic (congestive) heart failure: Secondary | ICD-10-CM | POA: Diagnosis present

## 2017-09-07 DIAGNOSIS — E119 Type 2 diabetes mellitus without complications: Secondary | ICD-10-CM

## 2017-09-07 DIAGNOSIS — Z9119 Patient's noncompliance with other medical treatment and regimen: Secondary | ICD-10-CM

## 2017-09-07 DIAGNOSIS — R748 Abnormal levels of other serum enzymes: Secondary | ICD-10-CM | POA: Diagnosis present

## 2017-09-07 DIAGNOSIS — I130 Hypertensive heart and chronic kidney disease with heart failure and stage 1 through stage 4 chronic kidney disease, or unspecified chronic kidney disease: Secondary | ICD-10-CM | POA: Diagnosis not present

## 2017-09-07 DIAGNOSIS — I480 Paroxysmal atrial fibrillation: Secondary | ICD-10-CM | POA: Diagnosis present

## 2017-09-07 DIAGNOSIS — M109 Gout, unspecified: Secondary | ICD-10-CM | POA: Diagnosis present

## 2017-09-07 DIAGNOSIS — R7989 Other specified abnormal findings of blood chemistry: Secondary | ICD-10-CM | POA: Diagnosis present

## 2017-09-07 DIAGNOSIS — I10 Essential (primary) hypertension: Secondary | ICD-10-CM | POA: Diagnosis present

## 2017-09-07 DIAGNOSIS — Z7901 Long term (current) use of anticoagulants: Secondary | ICD-10-CM

## 2017-09-07 DIAGNOSIS — E1165 Type 2 diabetes mellitus with hyperglycemia: Secondary | ICD-10-CM | POA: Diagnosis present

## 2017-09-07 DIAGNOSIS — Z87891 Personal history of nicotine dependence: Secondary | ICD-10-CM

## 2017-09-07 DIAGNOSIS — F1410 Cocaine abuse, uncomplicated: Secondary | ICD-10-CM | POA: Diagnosis present

## 2017-09-07 DIAGNOSIS — Z8249 Family history of ischemic heart disease and other diseases of the circulatory system: Secondary | ICD-10-CM

## 2017-09-07 DIAGNOSIS — Z794 Long term (current) use of insulin: Secondary | ICD-10-CM

## 2017-09-07 DIAGNOSIS — Z6841 Body Mass Index (BMI) 40.0 and over, adult: Secondary | ICD-10-CM

## 2017-09-07 DIAGNOSIS — I5030 Unspecified diastolic (congestive) heart failure: Secondary | ICD-10-CM | POA: Diagnosis not present

## 2017-09-07 DIAGNOSIS — Z8546 Personal history of malignant neoplasm of prostate: Secondary | ICD-10-CM | POA: Diagnosis not present

## 2017-09-07 DIAGNOSIS — Z9841 Cataract extraction status, right eye: Secondary | ICD-10-CM

## 2017-09-07 DIAGNOSIS — T502X5A Adverse effect of carbonic-anhydrase inhibitors, benzothiadiazides and other diuretics, initial encounter: Secondary | ICD-10-CM | POA: Diagnosis not present

## 2017-09-07 DIAGNOSIS — T148XXA Other injury of unspecified body region, initial encounter: Secondary | ICD-10-CM

## 2017-09-07 LAB — I-STAT TROPONIN, ED: Troponin i, poc: 0.17 ng/mL (ref 0.00–0.08)

## 2017-09-07 LAB — CBC WITH DIFFERENTIAL/PLATELET
Abs Immature Granulocytes: 0 10*3/uL (ref 0.0–0.1)
Basophils Absolute: 0 10*3/uL (ref 0.0–0.1)
Basophils Relative: 1 %
Eosinophils Absolute: 0.1 10*3/uL (ref 0.0–0.7)
Eosinophils Relative: 2 %
HEMATOCRIT: 42.3 % (ref 39.0–52.0)
HEMOGLOBIN: 13.1 g/dL (ref 13.0–17.0)
IMMATURE GRANULOCYTES: 0 %
LYMPHS ABS: 1.6 10*3/uL (ref 0.7–4.0)
LYMPHS PCT: 30 %
MCH: 29.6 pg (ref 26.0–34.0)
MCHC: 31 g/dL (ref 30.0–36.0)
MCV: 95.5 fL (ref 78.0–100.0)
Monocytes Absolute: 0.6 10*3/uL (ref 0.1–1.0)
Monocytes Relative: 11 %
NEUTROS ABS: 3 10*3/uL (ref 1.7–7.7)
NEUTROS PCT: 56 %
Platelets: 162 10*3/uL (ref 150–400)
RBC: 4.43 MIL/uL (ref 4.22–5.81)
RDW: 14.3 % (ref 11.5–15.5)
WBC: 5.4 10*3/uL (ref 4.0–10.5)

## 2017-09-07 LAB — RAPID URINE DRUG SCREEN, HOSP PERFORMED
AMPHETAMINES: NOT DETECTED
Barbiturates: NOT DETECTED
Benzodiazepines: NOT DETECTED
Cocaine: POSITIVE — AB
OPIATES: NOT DETECTED
Tetrahydrocannabinol: NOT DETECTED

## 2017-09-07 LAB — COMPREHENSIVE METABOLIC PANEL
ALBUMIN: 3.8 g/dL (ref 3.5–5.0)
ALT: 104 U/L — ABNORMAL HIGH (ref 0–44)
AST: 75 U/L — ABNORMAL HIGH (ref 15–41)
Alkaline Phosphatase: 91 U/L (ref 38–126)
Anion gap: 11 (ref 5–15)
BILIRUBIN TOTAL: 1 mg/dL (ref 0.3–1.2)
BUN: 16 mg/dL (ref 8–23)
CALCIUM: 9.5 mg/dL (ref 8.9–10.3)
CO2: 28 mmol/L (ref 22–32)
Chloride: 102 mmol/L (ref 98–111)
Creatinine, Ser: 1.32 mg/dL — ABNORMAL HIGH (ref 0.61–1.24)
GFR calc Af Amer: >60 mL/min (ref >60)
GFR calc non Af Amer: 56 mL/min — ABNORMAL LOW (ref >60)
GLUCOSE: 178 mg/dL — AB (ref 70–99)
POTASSIUM: 3.5 mmol/L (ref 3.5–5.1)
Sodium: 141 mmol/L (ref 135–145)
TOTAL PROTEIN: 7.1 g/dL (ref 6.5–8.1)

## 2017-09-07 LAB — TROPONIN I: TROPONIN I: 0.12 ng/mL — AB (ref <0.03)

## 2017-09-07 LAB — GLUCOSE, CAPILLARY
GLUCOSE-CAPILLARY: 260 mg/dL — AB (ref 70–99)
GLUCOSE-CAPILLARY: 217 mg/dL — AB (ref 70–99)

## 2017-09-07 LAB — BRAIN NATRIURETIC PEPTIDE: B NATRIURETIC PEPTIDE 5: 177.7 pg/mL — AB (ref 0.0–100.0)

## 2017-09-07 LAB — HEMOGLOBIN A1C
Hgb A1c MFr Bld: 8.3 % — ABNORMAL HIGH (ref 4.8–5.6)
MEAN PLASMA GLUCOSE: 191.51 mg/dL

## 2017-09-07 MED ORDER — SODIUM CHLORIDE 0.9% FLUSH: 3.0000 mL | INTRAVENOUS | Status: DC | PRN

## 2017-09-07 MED ORDER — INSULIN ASPART 100 UNIT/ML ~~LOC~~ SOLN
0.0000 [IU] | Freq: Three times a day (TID) | SUBCUTANEOUS | Status: DC
  Administered 2017-09-07: 8 [IU] via SUBCUTANEOUS
  Administered 2017-09-08: 2 [IU] via SUBCUTANEOUS
  Administered 2017-09-08: 5 [IU] via SUBCUTANEOUS
  Administered 2017-09-08: 3 [IU] via SUBCUTANEOUS
  Administered 2017-09-09: 5 [IU] via SUBCUTANEOUS
  Administered 2017-09-09: 3 [IU] via SUBCUTANEOUS
  Administered 2017-09-09: 5 [IU] via SUBCUTANEOUS
  Administered 2017-09-10: 8 [IU] via SUBCUTANEOUS
  Administered 2017-09-10: 11 [IU] via SUBCUTANEOUS
  Administered 2017-09-10: 5 [IU] via SUBCUTANEOUS
  Administered 2017-09-11: 15 [IU] via SUBCUTANEOUS
  Administered 2017-09-11: 5 [IU] via SUBCUTANEOUS
  Administered 2017-09-11: 15 [IU] via SUBCUTANEOUS
  Administered 2017-09-12: 3 [IU] via SUBCUTANEOUS
  Administered 2017-09-12 (×2): 8 [IU] via SUBCUTANEOUS
  Administered 2017-09-13: 5 [IU] via SUBCUTANEOUS
  Administered 2017-09-13: 3 [IU] via SUBCUTANEOUS

## 2017-09-07 MED ORDER — NON FORMULARY: 25.0000 mg | Freq: Every day | Status: DC

## 2017-09-07 MED ORDER — FENTANYL CITRATE (PF) 100 MCG/2ML IJ SOLN
50.0000 ug | Freq: Once | INTRAMUSCULAR | Status: AC
  Administered 2017-09-07: 50 ug via INTRAVENOUS
  Filled 2017-09-07: qty 2

## 2017-09-07 MED ORDER — INSULIN ASPART 100 UNIT/ML ~~LOC~~ SOLN
0.0000 [IU] | Freq: Every day | SUBCUTANEOUS | Status: DC
  Administered 2017-09-07 – 2017-09-09 (×3): 2 [IU] via SUBCUTANEOUS
  Administered 2017-09-10 – 2017-09-11 (×2): 5 [IU] via SUBCUTANEOUS
  Administered 2017-09-12: 3 [IU] via SUBCUTANEOUS

## 2017-09-07 MED ORDER — ACETAMINOPHEN 325 MG PO TABS
650.0000 mg | ORAL_TABLET | ORAL | Status: DC | PRN
  Administered 2017-09-07 – 2017-09-10 (×4): 650 mg via ORAL
  Filled 2017-09-07 (×4): qty 2

## 2017-09-07 MED ORDER — EPLERENONE 25 MG PO TABS
25.0000 mg | ORAL_TABLET | Freq: Every day | ORAL | Status: DC
Start: 2017-09-08 — End: 2017-09-13
  Administered 2017-09-08 – 2017-09-13 (×6): 25 mg via ORAL
  Filled 2017-09-07 (×6): qty 1

## 2017-09-07 MED ORDER — CARVEDILOL 3.125 MG PO TABS
3.1250 mg | ORAL_TABLET | Freq: Two times a day (BID) | ORAL | Status: DC
  Administered 2017-09-07 – 2017-09-13 (×11): 3.125 mg via ORAL
  Filled 2017-09-07 (×12): qty 1

## 2017-09-07 MED ORDER — FUROSEMIDE 10 MG/ML IJ SOLN
80.0000 mg | Freq: Two times a day (BID) | INTRAMUSCULAR | Status: DC
  Administered 2017-09-07 – 2017-09-09 (×5): 80 mg via INTRAVENOUS
  Filled 2017-09-07 (×6): qty 8

## 2017-09-07 MED ORDER — RIVAROXABAN 20 MG PO TABS
20.0000 mg | ORAL_TABLET | Freq: Every day | ORAL | Status: DC
  Administered 2017-09-08 – 2017-09-13 (×6): 20 mg via ORAL
  Filled 2017-09-07 (×6): qty 1

## 2017-09-07 MED ORDER — ROSUVASTATIN CALCIUM 40 MG PO TABS
40.0000 mg | ORAL_TABLET | Freq: Every day | ORAL | Status: DC
  Administered 2017-09-08 – 2017-09-12 (×5): 40 mg via ORAL
  Filled 2017-09-07 (×5): qty 1

## 2017-09-07 MED ORDER — POTASSIUM CHLORIDE CRYS ER 20 MEQ PO TBCR
40.0000 meq | EXTENDED_RELEASE_TABLET | Freq: Once | ORAL | Status: AC
  Administered 2017-09-07: 40 meq via ORAL
  Filled 2017-09-07: qty 2

## 2017-09-07 MED ORDER — FUROSEMIDE 10 MG/ML IJ SOLN
40.0000 mg | Freq: Once | INTRAMUSCULAR | Status: AC
  Administered 2017-09-07: 40 mg via INTRAVENOUS
  Filled 2017-09-07: qty 4

## 2017-09-07 MED ORDER — FUROSEMIDE 10 MG/ML IJ SOLN
40.0000 mg | Freq: Once | INTRAMUSCULAR | Status: AC
  Administered 2017-09-07: 40 mg via INTRAVENOUS

## 2017-09-07 MED ORDER — ASPIRIN EC 81 MG PO TBEC
81.0000 mg | DELAYED_RELEASE_TABLET | Freq: Every day | ORAL | Status: DC
  Administered 2017-09-08 – 2017-09-13 (×6): 81 mg via ORAL
  Filled 2017-09-07 (×6): qty 1

## 2017-09-07 MED ORDER — ASPIRIN 81 MG PO CHEW
324.0000 mg | CHEWABLE_TABLET | Freq: Once | ORAL | Status: AC
  Administered 2017-09-07: 324 mg via ORAL
  Filled 2017-09-07: qty 4

## 2017-09-07 MED ORDER — SODIUM CHLORIDE 0.9% FLUSH
3.0000 mL | Freq: Two times a day (BID) | INTRAVENOUS | Status: DC
  Administered 2017-09-07 – 2017-09-13 (×12): 3 mL via INTRAVENOUS

## 2017-09-07 MED ORDER — SACUBITRIL-VALSARTAN 24-26 MG PO TABS
1.0000 | ORAL_TABLET | Freq: Two times a day (BID) | ORAL | Status: DC
  Administered 2017-09-07 – 2017-09-09 (×5): 1 via ORAL
  Filled 2017-09-07 (×6): qty 1

## 2017-09-07 MED ORDER — QUETIAPINE FUMARATE 400 MG PO TABS
800.0000 mg | ORAL_TABLET | Freq: Every day | ORAL | Status: DC
  Administered 2017-09-07: 400 mg via ORAL
  Administered 2017-09-08 – 2017-09-12 (×5): 800 mg via ORAL
  Filled 2017-09-07 (×7): qty 2

## 2017-09-07 MED ORDER — INSULIN GLARGINE 100 UNIT/ML ~~LOC~~ SOLN
30.0000 [IU] | Freq: Every day | SUBCUTANEOUS | Status: DC
  Administered 2017-09-08 – 2017-09-09 (×2): 30 [IU] via SUBCUTANEOUS
  Filled 2017-09-07 (×2): qty 0.3

## 2017-09-07 MED ORDER — ALBUTEROL SULFATE (2.5 MG/3ML) 0.083% IN NEBU
5.0000 mg | INHALATION_SOLUTION | Freq: Once | RESPIRATORY_TRACT | Status: AC
  Administered 2017-09-07: 5 mg via RESPIRATORY_TRACT
  Filled 2017-09-07: qty 6

## 2017-09-07 MED ORDER — ONDANSETRON HCL 4 MG/2ML IJ SOLN: 4.0000 mg | Freq: Four times a day (QID) | INTRAMUSCULAR | Status: DC | PRN

## 2017-09-07 MED ORDER — VANCOMYCIN HCL IN DEXTROSE 1-5 GM/200ML-% IV SOLN
1000.0000 mg | Freq: Once | INTRAVENOUS | Status: AC
Start: 2017-09-07 — End: 2017-09-07
  Administered 2017-09-07: 1000 mg via INTRAVENOUS
  Filled 2017-09-07: qty 200

## 2017-09-07 MED ORDER — QUETIAPINE FUMARATE 400 MG PO TABS
400.0000 mg | ORAL_TABLET | Freq: Two times a day (BID) | ORAL | Status: DC
  Administered 2017-09-07: 400 mg via ORAL
  Filled 2017-09-07: qty 1

## 2017-09-07 MED ORDER — SODIUM CHLORIDE 0.9 % IV SOLN: 250.0000 mL | INTRAVENOUS | Status: DC | PRN

## 2017-09-07 MED ORDER — RIVAROXABAN 20 MG PO TABS: 20.0000 mg | ORAL_TABLET | Freq: Every day | ORAL | Status: DC

## 2017-09-07 NOTE — ED Notes (Signed)
Dietary contacted to send tray.

## 2017-09-07 NOTE — ED Provider Notes (Signed)
Medical screening examination/treatment/procedure(s) were conducted as a shared visit with non-physician practitioner(s) and myself.  I personally evaluated the patient during the encounter.  Clinical Impression:   Final diagnoses:  Acute congestive heart failure, unspecified heart failure type (HCC)  Wound infection  Elevated liver enzymes    The patient is a 62 year old male, he is in very poor health, he has a severe case of congestive heart failure with a very low ejection fraction and though he states he is compliant with his medications he continues to have more more swelling edema and is now becoming more short of breath.  He has not developed some foul-smelling wounds on his legs as well.  On exam the patient has some mild tachypnea, subtle rales at the bases, he has a chest x-ray showing some increased vascular congestion and interstitial edema in his lower extremities are severely edematous with some foul-smelling open wounds.  He has some increased desquamation of his right lower extremity, he has some redness of around these wounds  He will need antibiotics for these apparent wound infections but also needs to be diuresed and will need to be seen by the cardiology service.  He will need to be admitted to the hospital.  His troponin is elevated but I suspect this is related to the demand and stretch on his heart.  .Critical Care Performed by: Noemi Chapel, MD Authorized by: Noemi Chapel, MD   Critical care provider statement:    Critical care time (minutes):  35   Critical care time was exclusive of:  Separately billable procedures and treating other patients and teaching time   Critical care was necessary to treat or prevent imminent or life-threatening deterioration of the following conditions:  Cardiac failure   Critical care was time spent personally by me on the following activities:  Blood draw for specimens, development of treatment plan with patient or surrogate,  discussions with consultants, evaluation of patient's response to treatment, examination of patient, obtaining history from patient or surrogate, ordering and performing treatments and interventions, ordering and review of laboratory studies, ordering and review of radiographic studies, pulse oximetry, re-evaluation of patient's condition and review of old charts       Noemi Chapel, MD 09/09/17 2021

## 2017-09-07 NOTE — ED Notes (Signed)
ED Provider at bedside. 

## 2017-09-07 NOTE — ED Notes (Signed)
+   pulse to the right foot with doppler

## 2017-09-07 NOTE — Progress Notes (Signed)
Pt states that he takes Seroquel 800mg  instead of 400mg  at night, he reports that he doesn't take any in the morning.NP on call was notified. An order for pharmacy consult was placed and pharmacy adjusted the medication.  The added 400mg  was given and the second half, 400mg  was returned to the [facility]. [place], RN was a witness.  Will continue to monitor.

## 2017-09-07 NOTE — ED Provider Notes (Signed)
[facility] EMERGENCY DEPARTMENT Provider Note   CSN: [id number] Arrival date & time: 09/07/17  0240     History   Chief Complaint Chief Complaint  Patient presents with  . Shortness of Breath  . Leg Swelling    HPI [name] [name] is a 62 y.o. male.  HPI   Pt is a 62 y/o male with a h/o pAF (on xarelto), prostate CA, combined systolic & diastolic CHF (EF 97-35% on ECHO 05/2017), CKD, HTN, HLD, who presents to the ED today for evaluation of BLE edema that has been worsening for the last 2 weeks. He states that he also noted distension of his abdomen.  He has also had worsening shortness of breath, at rest, with exertion, and when lying flat. He notes a 6lb weight gain in the last 4 weeks. He denies any chest pain, cough, fevers, chills. States he takes 14m torsemide daily and has been compliant with this medication.  He reports no improvement after DuoNeb in the ED.  States he has a RLE wound that has been present for the last 8-9 days and has been draining fluid.  Pt sees Dr. BTempie Hoistwith cardiology.   Last ECHO 05/2017: "Left ventricle: The cavity size was normal. Systolic function was severely reduced. The estimated ejection fraction was in the range of 25% to 30%. Diffuse hypokinesis. There is akinesis of the anterolateral myocardium. Tricuspid valve: There was mild regurgitation. Pericardium, extracardiac: There was no pericardial effusion."  [facility]  Past Medical History:  Diagnosis Date  . Atrial fibrillation ([place]   . Cancer ([facility]    Prostate cancer-bx. 3 weeks ago  . Cataract   . Chronic combined systolic and diastolic CHF (congestive heart failure) (HCC)    a) EF 40-45% per 2D echo (02/2012) with grade 1 DD b)  NICM c) RHC (04/2012): RA: 4, RV 45/3/4, PA 42/9 (24), PCWP 14, Fick CO/CI: 5.2 /2.2, PVR 1.9 WU, PA 62% and 64% d) ECHO (10/2012) EF 40-45%, grade II DD, RV nl  . CKD (chronic kidney disease) stage 2,  GFR 60-89 ml/min    BL SCr approximately 1-1.3  . Colitis 05/2009   History of colitis of ascending colon noted on CT abd/pelvis (05/2009), with interval resolution with subsequent CT  . Continuous chronic alcoholism ([place]   . Degenerative lumbar spinal stenosis    s/p L2-3, L3-4, L4-5 laminectomy partial facetectomy, and bilateral foraminotomy  . Diabetes mellitus without complication ([place]    Type II  . Dysrhythmia    A. Fib  . Family history of adverse reaction to anesthesia    "sister, can't go to sleep"  . GERD (gastroesophageal reflux disease)   . H/O cocaine abuse   . Hepatic steatosis    suspected 2/2 alcohol abuse  . Hepatitis C   . History of pancreatitis 01/2011   Admission for acute pancreatitis presumed 2/2 ongoing alcohol abuse- and hypertriglyceridemia  . History of pneumonia   . HIT (heparin-induced thrombocytopenia) ([place]   . Hyperlipidemia   . Hypertension    uncontrolled with medication noncompliance  . Hypertriglyceridemia   . NICM (nonischemic cardiomyopathy) ([place]    a. LHC (04/2012): nl arteries  . PFO (patent foramen ovale) 01/2012   with right to left shunt, noted per TEE in evaluation for source of embolic stroke in 23/2992 . Rhabdomyolysis 02/22/2012   H/O rhabdomyolysis in 01/2012 that was idiopathic, cause never identified  . Schizophrenia, schizo-affective (HP[place]   .  Shortness of breath dyspnea   . Splenic cyst   . Stroke ([place]) 01/2012   Small cerebellar infarcts right greater than left as well as questionable acute left external capsule and caudate nuclear punctate lacunar infarcts noted per MRI (01/2012) - presumed to be embolic likely source PFO with right to left shunt (noted per TEE 01/ 2014)    Patient Active Problem List   Diagnosis Date Noted  . Acute on chronic systolic heart failure ([place]) 09/07/2017  . Hypotension 05/31/2017  . DKA (diabetic ketoacidoses) ([place]) 05/27/2017  . Acute metabolic encephalopathy 72/07/2180  . CKD (chronic  kidney disease), stage III ([place]) 05/27/2017  . Stroke ([place]) 05/27/2017  . Toxic metabolic encephalopathy   . Chronic combined systolic (congestive) and diastolic (congestive) heart failure ([place]) 05/12/2017  . Elevated troponin 05/12/2017  . Hyperosmolar non-ketotic state in patient with type 2 diabetes mellitus ([place]) 05/12/2017  . Dehydration 05/12/2017  . Nonketotic hyperglycinemia, type II ([place]) 05/12/2017  . AKI (acute kidney injury) ([place])   . Acute on chronic respiratory failure with hypoxia ([place])   . Acute on chronic diastolic (congestive) heart failure ([place])   . Chest pain 11/27/2016  . SOB (shortness of breath) 11/26/2016  . Acute exacerbation of CHF (congestive heart failure) ([place]) 10/01/2016  . Acute on chronic systolic CHF (congestive heart failure) ([place]) 10/01/2016  . Diabetes ([place])   . Overgrown toenails 09/10/2015  . Heart failure with reduced ejection fraction, NYHA class III ([place]) 06/07/2015  . Hepatitis C antibody test positive 06/07/2015  . Paroxysmal atrial fibrillation ([place]) 06/07/2015  . Schizophrenia ([place]) 03/06/2014  . Alcohol abuse 09/20/2013  . Generalized weakness 08/04/2013  . Carpal tunnel syndrome 04/11/2013  . Chronic back pain greater than 3 months duration 07/19/2012  . Gastric ulcer with hemorrhage 06/25/2012  . CKD (chronic kidney disease) stage 2, GFR 60-89 ml/min   . CVA (cerebral infarction) 02/29/2012  . HTN (hypertension) 02/20/2012    Past Surgical History:  Procedure Laterality Date  . ACHILLES TENDON REPAIR Right 2007   "it was torn"  . BACK SURGERY    . CARDIAC CATHETERIZATION N/A 09/02/2015   Procedure: Right Heart Cath;  Surgeon: Jolaine Artist, MD;  Location: [facility];  Service: Cardiovascular;  Laterality: N/A;  . CATARACT EXTRACTION W/ INTRAOCULAR LENS IMPLANT Right   . ESOPHAGOGASTRODUODENOSCOPY N/A 06/25/2012   Procedure: ESOPHAGOGASTRODUODENOSCOPY (EGD);  Surgeon: Milus Banister, MD;  Location: [facility];   Service: Endoscopy;  Laterality: N/A;  . I&D EXTREMITY  03/20/2011   Procedure: IRRIGATION AND DEBRIDEMENT EXTREMITY;  Surgeon: Kerin Salen, MD;  Location: [facility];  Service: Orthopedics;  Laterality: Left;  I&D LEFT ACHILLIES TENDON  . KNEE ARTHROSCOPY Left   . LUMBAR LAMINECTOMY     L2-3, L3-4, L4-5 laminectomy, partial facetectomy  . RESECTION DISTAL CLAVICAL  09/17/2011   Procedure: RESECTION DISTAL CLAVICAL;  Surgeon: Nita Sells, MD;  Location: [facility];  Service: Orthopedics;  Laterality: Right;  right shoulder arthroscopy with sad and open distal clavicle excision   . ROBOT ASSISTED LAPAROSCOPIC RADICAL PROSTATECTOMY N/A 12/16/2012   Procedure: ROBOTIC ASSISTED LAPAROSCOPIC PROSTATECTOMY ;  Surgeon: Ardis Hughs, MD;  Location: [facility];  Service: Urology;  Laterality: N/A;  . TONSILLECTOMY          Home Medications    Prior to Admission medications   Medication Sig Start Date End Date Taking? Authorizing Provider  carvedilol (COREG) 3.125 MG tablet Take 1 tablet (3.125 mg total) by  mouth 2 (two) times daily with a meal. 06/03/17  Yes Tillery, Satira Mccallum, PA-C  eplerenone (INSPRA) 25 MG tablet Take 1 tablet (25 mg total) by mouth daily. 06/23/17  Yes Bensimhon, Shaune Pascal, MD  Insulin Glargine (LANTUS SOLOSTAR) 100 UNIT/ML Solostar Pen Inject 30 Units into the skin 2 (two) times daily. Patient taking differently: Inject 30 Units into the skin every morning.  06/03/17 09/07/17 Yes Elwin Mocha, MD  insulin regular (NOVOLIN R,HUMULIN R) 100 units/mL injection 3 times with meals.Take 30 minutes before meal. Patient taking differently: Inject 18 Units into the skin 3 (three) times daily before meals.  05/14/17 05/14/18 Yes Shelly Coss, MD  metFORMIN (GLUCOPHAGE) 500 MG tablet Take 500 mg by mouth every morning. 07/05/17  Yes [provider]  QUEtiapine (SEROQUEL) 400 MG tablet Take 1 tablet (400 mg total) by mouth 2 (two) times daily for 21  days. 06/03/17 09/07/17 Yes Elwin Mocha, MD  rivaroxaban (XARELTO) 20 MG TABS tablet Take 1 tablet (20 mg total) by mouth daily. 06/04/17  Yes Shirley Friar, PA-C  rosuvastatin (CRESTOR) 40 MG tablet Take 1 tablet (40 mg total) by mouth daily. 07/07/17  Yes Georgiana Shore, NP  sacubitril-valsartan (ENTRESTO) 24-26 MG Take 1 tablet by mouth 2 (two) times daily. 08/04/17  Yes Larey Dresser, MD  torsemide (DEMADEX) 20 MG tablet Take 3 tablets (60 mg total) by mouth daily. 06/23/17  Yes Bensimhon, Shaune Pascal, MD  ACCU-CHEK FASTCLIX LANCETS MISC Check blood sugar 4 times a day before meals and bedtime 02/16/17   [name], MD  B-D UF III MINI PEN NEEDLES 31G X 5 MM MISC Use four times daily as directed. Dx code E11.00, Z79.4 12/23/16   Aldine Contes, MD  Blood Glucose Monitoring Suppl (ACCU-CHEK GUIDE) w/Device KIT 1 each by Does not apply route 4 (four) times daily. 02/16/17   [name], MD  glucose blood (ACCU-CHEK GUIDE) test strip Check blood sugar 4 times a day before meal and bedtime 02/16/17   [name], MD    Family History Family History  Problem Relation Age of Onset  . CAD Mother 48       deceased  . CAD Sister   . CAD Brother 66       died from MI at age 36yo  . Hypertension Unknown     Social History Social History   Tobacco Use  . Smoking status: Former Smoker    Packs/day: 1.00    Years: 30.00    Pack years: 30.00    Types: Cigarettes    Last attempt to quit: 06/24/2001    Years since quitting: 16.2  . Smokeless tobacco: Never Used  Substance Use Topics  . Alcohol use: Yes    Alcohol/week: 0.0 standard drinks    Comment: last use last week  . Drug use: Yes    Types: "Crack" cocaine    Comment: last used cocaine last week     Allergies   Heparin; Spironolactone; and Thorazine [chlorpromazine hcl]   Review of Systems Review of Systems  Constitutional: Negative for chills and fever.  HENT: Negative for congestion, rhinorrhea and  sore throat.   Eyes: Negative for visual disturbance.  Respiratory: Positive for shortness of breath. Negative for cough.        Orthopnea, DOE  Cardiovascular: Positive for leg swelling. Negative for chest pain.  Gastrointestinal: Positive for abdominal distention. Negative for abdominal pain, constipation, diarrhea, nausea and vomiting.  Genitourinary: Negative for dysuria.  Musculoskeletal: Negative for back pain and neck pain.  Skin: Negative for color change.  Neurological: Negative for headaches.   Physical Exam Updated Vital Signs BP (!) 125/98 (BP Location: Left Arm)   Pulse (!) 109   Temp 98.1 F (36.7 C) (Oral)   Resp 20   Wt (!) 140.4 kg   SpO2 94%   BMI 43.17 kg/m   Physical Exam  Constitutional: He appears well-developed and well-nourished.  HENT:  Head: Normocephalic and atraumatic.  Eyes: Conjunctivae are normal.  Neck: Neck supple.  Difficult to discern JVD given pts body habitus  Cardiovascular:  No murmur heard. Irregularly irregular rhythm, afib on monitor with mild tachycardia to 105  Pulmonary/Chest:  Mild tachypnea. Decreased breath sounds throughout all lung fields, end expiratory wheeze in all lung fields.   Abdominal: Soft. There is no tenderness.  Musculoskeletal: He exhibits no edema.  BLE edema up to the thighs, 2+  Neurological: He is alert.  Skin: Skin is warm and dry.  Open wound to RLE (pictured below), no surrounding warmth, erythema. Wound is malodorous  Psychiatric: He has a normal mood and affect.  Nursing note and vitals reviewed.      ED Treatments / Results  Labs (all labs ordered are listed, but only abnormal results are displayed) Labs Reviewed  COMPREHENSIVE METABOLIC PANEL - Abnormal; Notable for the following components:      Result Value   Glucose, Bld 178 (*)    Creatinine, Ser 1.32 (*)    AST 75 (*)    ALT 104 (*)    GFR calc non Af Amer 56 (*)    All other components within normal limits  BRAIN NATRIURETIC  PEPTIDE - Abnormal; Notable for the following components:   B Natriuretic Peptide 177.7 (*)    All other components within normal limits  TROPONIN I - Abnormal; Notable for the following components:   Troponin I 0.12 (*)    All other components within normal limits  I-STAT TROPONIN, ED - Abnormal; Notable for the following components:   Troponin i, poc 0.17 (*)    All other components within normal limits  CBC WITH DIFFERENTIAL/PLATELET  CBC WITH DIFFERENTIAL/PLATELET  HEMOGLOBIN A1C    EKG EKG Interpretation  Date/Time:  Tuesday September 07 2017 08:29:27 EDT Ventricular Rate:  115 PR Interval:  206 QRS Duration: 102 QT Interval:  330 QTC Calculation: 456 R Axis:   164 Text Interpretation:  Undetermined rhythm Left posterior fascicular block Anterior infarct , age undetermined Abnormal ECG Since last tracing ectopy now present Confirmed by Noemi Chapel [phone]) on 09/07/2017 10:12:17 AM   Radiology Dg Chest 2 View  Result Date: 09/07/2017 CLINICAL DATA:  62 year old male with bilateral lower extremity swelling and oozing. Chronic right leg wound. Shortness of breath for 3 days. EXAM: CHEST - 2 VIEW COMPARISON:  Chest radiographs 05/27/2017 and earlier. FINDINGS: Stable mild cardiomegaly and mediastinal contours. Stable somewhat low lung volumes. No pneumothorax, pleural effusion or consolidation. Pulmonary vascularity appears increased compared to April. Stable visualized osseous structures. Negative visible bowel gas pattern. IMPRESSION: 1. Increased pulmonary vascularity since April, suggesting mild or developing interstitial edema. 2. Stable mild cardiomegaly. Electronically Signed   By: [name]   On: 09/07/2017 08:59    Procedures Procedures (including critical care time) CRITICAL CARE Performed by: [name] Booze   Total critical care time: 32 minutes  Critical care time was exclusive of separately billable procedures and treating other patients.  Critical care was  necessary  to treat or prevent imminent or life-threatening deterioration.  Critical care was time spent personally by me on the following activities: development of treatment plan with patient and/or surrogate as well as nursing, discussions with consultants, evaluation of patient's response to treatment, examination of patient, obtaining history from patient or surrogate, ordering and performing treatments and interventions, ordering and review of laboratory studies, ordering and review of radiographic studies, pulse oximetry and re-evaluation of patient's condition.   Medications Ordered in ED Medications  furosemide (LASIX) injection 80 mg (has no administration in time range)  carvedilol (COREG) tablet 3.125 mg (has no administration in time range)  rosuvastatin (CRESTOR) tablet 40 mg (has no administration in time range)  sacubitril-valsartan (ENTRESTO) 24-26 mg per tablet (has no administration in time range)  insulin glargine (LANTUS) injection 30 Units (has no administration in time range)  rivaroxaban (XARELTO) tablet 20 mg (has no administration in time range)  sodium chloride flush (NS) 0.9 % injection 3 mL (has no administration in time range)  sodium chloride flush (NS) 0.9 % injection 3 mL (has no administration in time range)  0.9 %  sodium chloride infusion (has no administration in time range)  aspirin EC tablet 81 mg (has no administration in time range)  acetaminophen (TYLENOL) tablet 650 mg (has no administration in time range)  ondansetron (ZOFRAN) injection 4 mg (has no administration in time range)  insulin aspart (novoLOG) injection 0-15 Units (has no administration in time range)  insulin aspart (novoLOG) injection 0-5 Units (has no administration in time range)  eplerenone (INSPRA) tablet 25 mg (has no administration in time range)  albuterol (PROVENTIL) (2.5 MG/3ML) 0.083% nebulizer solution 5 mg (5 mg Nebulization Given 09/07/17 0946)  furosemide (LASIX) injection 40  mg (40 mg Intravenous Given 09/07/17 1109)  vancomycin (VANCOCIN) IVPB 1000 mg/200 mL premix (0 mg Intravenous Stopped 09/07/17 1258)  fentaNYL (SUBLIMAZE) injection 50 mcg (50 mcg Intravenous Given 09/07/17 1106)  aspirin chewable tablet 324 mg (324 mg Oral Given 09/07/17 1105)  furosemide (LASIX) injection 40 mg (40 mg Intravenous Given 09/07/17 1510)  potassium chloride SA (K-DUR,KLOR-CON) CR tablet 40 mEq (40 mEq Oral Given 09/07/17 1509)     Initial Impression / Assessment and Plan / ED Course  I have reviewed the triage vital signs and the nursing notes.  Pertinent labs & imaging results that were available during my care of the patient were reviewed by me and considered in my medical decision making (see chart for details).    Discussed pt presentation and exam findings with Dr. Sabra Heck, who evaluated the pt and agrees with the plan for admission.  10:59 AM CONSULT with cardiology who will see the patient.   11:28 PM CONSULT with Dr. Doylene Canard who will see the patient.   On reevaluation, plan for admission was discussed. Pt agreeable to plan. Still tachycardic to 105 on monitor. Maintaining O2 sats to 95% on RA. Mild tachypnea. Pt feels improved after fentanyl.   Dr. Doylene Canard saw the patient and stated that this is not a regular patient of his.   Heart failure team is in the ED to evaluate the patient. They will be available for consult however recommended medical admission.   1:55 PM CONSULT with Dr. Lorin Mercy who will admit the patient to the hospitalist service.   Final Clinical Impressions(s) / ED Diagnoses   Final diagnoses:  Acute congestive heart failure, unspecified heart failure type ([place])  Wound infection  Elevated liver enzymes   Patient presenting with worsening shortness of  breath and bilateral lower extremity edema.  Also complaining of orthopnea and recent weight gain.  Has been compliant with his diuretics at home.  He is maintaining oxygen saturation to 90% on room air  though he is mildly tachypneic.  His heart rate is mildly elevated to 103.  Appears to be in A. fib on the monitor.  He is afebrile with an elevated blood pressure.  CXR with developing interstitial edema stable cardiomegaly.  BNP slightly elevated.  CBC without leukocytosis.  CMP with elevated liver enzymes, concerning for hepatic congestion secondary to acute CHF exacerbation. Troponin is elevated, though this is consistent with prior. Last cath with clear coronary arteries, suspect elevated due to demand ischemia.  ECG shows heart rate of 115 with sinus tach, a left posterior fascicular block, anterior infarct,    Lasix given in the ED. ASA given as well given elevated troponin. Vancomycin was also initiated given his RLE infection.   Pt will require admission for acute CHF exacerbation.   ED Discharge Orders    None       Bishop Dublin 09/07/17 1607    Noemi Chapel, MD 09/09/17 2021

## 2017-09-07 NOTE — ED Triage Notes (Signed)
Pt in from home c/o bil leg swelling and oozing R leg wound that is chronic but getting worse, pt c/o worsening SOB, pt denies CP, denies n/v/d, pt A&O x4

## 2017-09-07 NOTE — Consult Note (Addendum)
Advanced Heart Failure Team Consult Note   Primary Physician: Rogers Blocker, MD PCP-Cardiologist:  No primary care provider on file.  Reason for Consultation: Acute on chronic systolic CHF  HPI:    [name] [name] is seen today for evaluation of Acute on chronic systolic CHF (EF 26-41%) due to NICM at the request of Dr. Sabra Heck in the ED.   [name] [name] is a 62 y.o. 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 Had history of acute renal failure in setting of rhabdo requiring short term HD.     Last seen in HF clinic 08/04/17. Switched to [place] at that visit. C/o SOB after approx 50 feet. Refused paramedicine. He was scheduled for follow up with Dr. Haroldine Laws with an Echo on 09/07/17, but presented to the [facility] with worsening SOB and edema. Course further complicated by BLE wounds with drainage. Tachypnea on exam. Pertinent labs on admission include K 3.5, Cr 1.32, Trop 0.17, BNP 177.7, WBC 5.4, and Hgb 13.1. CXR shows increased pulmonary vascularity compared to imaging 04/2017 suggestive of edema.   Feeling better since arrival. Has had nearly a L off so far with IV lasix 40 mg. He has been orthopneic, and SOB with any exertion. Swelling has extended up into thighs over past 2 weeks. Doesn't weight regularly at home. Tries to watch salt intake but drinking > 2L of fluid. He has mild lightheadedness at times with rapid standing. Denies fever or chills. No injury to leg site. Says it just "popped up".   Echo 05/31/2017 LVEF 25-30%  Review of Systems: [y] = yes, '[ ]'$  = no   General: Weight gain [y]; Weight loss '[ ]'$ ; Anorexia '[ ]'$ ; Fatigue [y]; Fever '[ ]'$ ; Chills '[ ]'$ ; Weakness '[ ]'$   Cardiac: Chest pain/pressure '[ ]'$ ; Resting SOB '[ ]'$ ; Exertional SOB [y]; Orthopnea [y]; Pedal Edema [y]; Palpitations  '[ ]'$ ; Syncope '[ ]'$ ; Presyncope '[ ]'$ ; Paroxysmal nocturnal dyspnea'[ ]'$   Pulmonary: Cough [y]; Wheezing'[ ]'$ ; Hemoptysis'[ ]'$ ; Sputum '[ ]'$ ; Snoring [ y]  GI: Vomiting'[ ]'$ ; Dysphagia'[ ]'$ ; Melena'[ ]'$ ; Hematochezia '[ ]'$ ; Heartburn'[ ]'$ ; Abdominal pain '[ ]'$ ; Constipation '[ ]'$ ; Diarrhea '[ ]'$ ; BRBPR '[ ]'$   GU: Hematuria'[ ]'$ ; Dysuria '[ ]'$ ; Nocturia'[ ]'$   Vascular: Pain in legs with walking '[ ]'$ ; Pain in feet with lying flat '[ ]'$ ; Non-healing sores Blue.Reese ]; Stroke [ y]; TIA '[ ]'$ ; Slurred speech '[ ]'$ ;  Neuro: Headaches'[ ]'$ ; Vertigo'[ ]'$ ; Seizures'[ ]'$ ; Paresthesias'[ ]'$ ;Blurred vision '[ ]'$ ; Diplopia '[ ]'$ ; Vision changes '[ ]'$   Ortho/Skin: Arthritis [y]; Joint pain [y]; Muscle pain '[ ]'$ ; Joint swelling '[ ]'$ ; Back Pain '[ ]'$ ; Rash '[ ]'$   Psych: Depression'[ ]'$ ; Anxiety'[ ]'$   Heme: Bleeding problems '[ ]'$ ; Clotting disorders '[ ]'$ ; Anemia [ y]  Endocrine: Diabetes Blue.Reese ]; Thyroid dysfunction'[ ]'$   Home Medications Prior to Admission medications   Medication Sig Start Date End Date Taking? Authorizing Provider  carvedilol (COREG) 3.125 MG tablet Take 1 tablet (3.125 mg total) by mouth 2 (two) times daily with a meal. 06/03/17  Yes Tillery, Satira Mccallum, PA-C  eplerenone (INSPRA) 25 MG tablet Take 1 tablet (25 mg total) by mouth daily. 06/23/17  Yes Raman Featherston, Shaune Pascal, MD  Insulin Glargine (LANTUS SOLOSTAR) 100 UNIT/ML Solostar Pen Inject 30 Units into the skin 2 (two) times daily. Patient taking  differently: Inject 30 Units into the skin every morning.  06/03/17 09/07/17 Yes Elwin Mocha, MD  insulin regular (NOVOLIN R,HUMULIN R) 100 units/mL injection 3 times with meals.Take 30 minutes before meal. Patient taking differently: Inject 18 Units into the skin 3 (three) times daily before meals.  05/14/17 05/14/18 Yes Shelly Coss, MD  metFORMIN (GLUCOPHAGE) 500 MG tablet Take 500 mg by mouth every morning. 07/05/17  Yes [provider]  QUEtiapine (SEROQUEL) 400 MG tablet Take 1 tablet (400 mg total) by mouth 2 (two) times daily for 21 days. 06/03/17 09/07/17 Yes  Elwin Mocha, MD  rivaroxaban (XARELTO) 20 MG TABS tablet Take 1 tablet (20 mg total) by mouth daily. 06/04/17  Yes Shirley Friar, PA-C  rosuvastatin (CRESTOR) 40 MG tablet Take 1 tablet (40 mg total) by mouth daily. 07/07/17  Yes Georgiana Shore, NP  sacubitril-valsartan (ENTRESTO) 24-26 MG Take 1 tablet by mouth 2 (two) times daily. 08/04/17  Yes Larey Dresser, MD  torsemide (DEMADEX) 20 MG tablet Take 3 tablets (60 mg total) by mouth daily. 06/23/17  Yes Solan Vosler, Shaune Pascal, MD  ACCU-CHEK FASTCLIX LANCETS MISC Check blood sugar 4 times a day before meals and bedtime 02/16/17   [name], MD  B-D UF III MINI PEN NEEDLES 31G X 5 MM MISC Use four times daily as directed. Dx code E11.00, Z79.4 12/23/16   Aldine Contes, MD  Blood Glucose Monitoring Suppl (ACCU-CHEK GUIDE) w/Device KIT 1 each by Does not apply route 4 (four) times daily. 02/16/17   [name], MD  glucose blood (ACCU-CHEK GUIDE) test strip Check blood sugar 4 times a day before meal and bedtime 02/16/17   [name], MD    Past Medical History: Past Medical History:  Diagnosis Date  . Atrial fibrillation ([place])   . Cancer [facility])    Prostate cancer-bx. 3 weeks ago  . Cataract   . Chronic combined systolic and diastolic CHF (congestive heart failure) (HCC)    a) EF 40-45% per 2D echo (02/2012) with grade 1 DD b)  NICM c) RHC (04/2012): RA: 4, RV 45/3/4, PA 42/9 (24), PCWP 14, Fick CO/CI: 5.2 /2.2, PVR 1.9 WU, PA 62% and 64% d) ECHO (10/2012) EF 40-45%, grade II DD, RV nl  . CKD (chronic kidney disease) stage 2, GFR 60-89 ml/min    BL SCr approximately 1-1.3  . Colitis 05/2009   History of colitis of ascending colon noted on CT abd/pelvis (05/2009), with interval resolution with subsequent CT  . Continuous chronic alcoholism ([place])   . Degenerative lumbar spinal stenosis    s/p L2-3, L3-4, L4-5 laminectomy partial facetectomy, and bilateral foraminotomy  . Diabetes mellitus without complication ([place])      Type II  . Dysrhythmia    A. Fib  . Family history of adverse reaction to anesthesia    "sister, can't go to sleep"  . GERD (gastroesophageal reflux disease)   . H/O cocaine abuse   . Hepatic steatosis    suspected 2/2 alcohol abuse  . Hepatitis C   . History of pancreatitis 01/2011   Admission for acute pancreatitis presumed 2/2 ongoing alcohol abuse- and hypertriglyceridemia  . History of pneumonia   . HIT (heparin-induced thrombocytopenia) ([place])   . Hyperlipidemia   . Hypertension    uncontrolled with medication noncompliance  . Hypertriglyceridemia   . NICM (nonischemic cardiomyopathy) ([place])    a. LHC (04/2012): nl arteries  . PFO (patent foramen ovale) 01/2012   with right to left  shunt, noted per TEE in evaluation for source of embolic stroke in 06/5991  . Rhabdomyolysis 02/22/2012   H/O rhabdomyolysis in 01/2012 that was idiopathic, cause never identified  . Schizophrenia, schizo-affective ([place])   . Shortness of breath dyspnea   . Splenic cyst   . Stroke ([place]) 01/2012   Small cerebellar infarcts right greater than left as well as questionable acute left external capsule and caudate nuclear punctate lacunar infarcts noted per MRI (01/2012) - presumed to be embolic likely source PFO with right to left shunt (noted per TEE 01/ 2014)    Past Surgical History: Past Surgical History:  Procedure Laterality Date  . ACHILLES TENDON REPAIR Right 2007   "it was torn"  . BACK SURGERY    . CARDIAC CATHETERIZATION N/A 09/02/2015   Procedure: Right Heart Cath;  Surgeon: Jolaine Artist, MD;  Location: [facility];  Service: Cardiovascular;  Laterality: N/A;  . CATARACT EXTRACTION W/ INTRAOCULAR LENS IMPLANT Right   . ESOPHAGOGASTRODUODENOSCOPY N/A 06/25/2012   Procedure: ESOPHAGOGASTRODUODENOSCOPY (EGD);  Surgeon: Milus Banister, MD;  Location: [facility];  Service: Endoscopy;  Laterality: N/A;  . I&D EXTREMITY  03/20/2011   Procedure: IRRIGATION AND DEBRIDEMENT EXTREMITY;   Surgeon: Kerin Salen, MD;  Location: [facility];  Service: Orthopedics;  Laterality: Left;  I&D LEFT ACHILLIES TENDON  . KNEE ARTHROSCOPY Left   . LUMBAR LAMINECTOMY     L2-3, L3-4, L4-5 laminectomy, partial facetectomy  . RESECTION DISTAL CLAVICAL  09/17/2011   Procedure: RESECTION DISTAL CLAVICAL;  Surgeon: Nita Sells, MD;  Location: [facility];  Service: Orthopedics;  Laterality: Right;  right shoulder arthroscopy with sad and open distal clavicle excision   . ROBOT ASSISTED LAPAROSCOPIC RADICAL PROSTATECTOMY N/A 12/16/2012   Procedure: ROBOTIC ASSISTED LAPAROSCOPIC PROSTATECTOMY ;  Surgeon: Ardis Hughs, MD;  Location: [facility];  Service: Urology;  Laterality: N/A;  . TONSILLECTOMY      Family History: Family History  Problem Relation Age of Onset  . CAD Mother 17       deceased  . CAD Sister   . CAD Brother 30       died from MI at age 21yo  . Hypertension Unknown     Social History: Social History   Socioeconomic History  . Marital status: Single    Spouse name: Not on file  . Number of children: Not on file  . Years of education: 12th grade  . Highest education level: Not on file  Occupational History  . Occupation: Disability    Comment: 2/2 schizophrenia  Social Needs  . Financial resource strain: Not on file  . Food insecurity:    Worry: Not on file    Inability: Not on file  . Transportation needs:    Medical: Not on file    Non-medical: Not on file  Tobacco Use  . Smoking status: Former Smoker    Packs/day: 1.00    Years: 30.00    Pack years: 30.00    Types: Cigarettes    Last attempt to quit: 06/24/2001    Years since quitting: 16.2  . Smokeless tobacco: Never Used  Substance and Sexual Activity  . Alcohol use: Yes    Alcohol/week: 0.0 standard drinks  . Drug use: Yes    Types: "Crack" cocaine    Comment: h/o cocaine abuse; last use about 2 months ago (10/01/16)  . Sexual activity: Yes  Lifestyle  . Physical activity:     Days per week:  Not on file    Minutes per session: Not on file  . Stress: Not on file  Relationships  . Social connections:    Talks on phone: Not on file    Gets together: Not on file    Attends religious service: Not on file    Active member of club or organization: Not on file    Attends meetings of clubs or organizations: Not on file    Relationship status: Not on file  Other Topics Concern  . Not on file  Social History Narrative   Lives in [place] alone, has a [facility] aide that helps with medications 4-5 days a week with medications, helping to clean.    Allergies:  Allergies  Allergen Reactions  . Heparin Other (See Comments)    Documented HIT under problem list Pt states he's not allergic. " They told me not take it anymore"  . Spironolactone Other (See Comments)    Breast tenderness  . Thorazine [Chlorpromazine Hcl] Other (See Comments)    Body freezes up    Objective:    Vital Signs:   Temp:  [98.4 F (36.9 C)] 98.4 F (36.9 C) (08/13 0945) Pulse Rate:  [70-114] 106 (08/13 1200) Resp:  [14-25] 18 (08/13 1200) BP: (99-122)/(64-103) 122/79 (08/13 1200) SpO2:  [94 %-100 %] 100 % (08/13 1200) Weight:  [140.4 kg] 140.4 kg (08/13 0844)    Weight change: Filed Weights   09/07/17 0844  Weight: (!) 140.4 kg    Intake/Output:   Intake/Output Summary (Last 24 hours) at 09/07/2017 1201 Last data filed at 09/07/2017 1157 Gross per 24 hour  Intake -  Output 700 ml  Net -700 ml      Physical Exam    General:  Chronically ill appearing. NAD.  HEENT: normal anicteric Neck: supple. JVP difficult, but appears elevated. Carotids 2+ bilat; no bruits. No lymphadenopathy or thyromegaly appreciated. Cor: PMI nondisplaced. Tachy, and irregular with frequent ectopy. 2/6 MR Lungs: clear no wheeze  Abdomen: obese soft, nontender, nondistended. No hepatosplenomegaly. No bruits or masses. Good bowel sounds. Extremities: no cyanosis or clubbing. 2+ edema into thighs. RLE  with scaling and open wound, + mal odor.   Neuro: alert & oriented x 3, cranial nerves grossly intact. moves all 4 extremities w/o difficulty. Affect pleasant   Telemetry   Difficult to tell if Afib vs Sinus tach with PVCs. Appears to have p waves at times, but more irregular at otherse. Personally reviewed.   EKG    Ectopic atrial tach 109 no significant ST-T wave abnormalities , personally reviewed.   Labs   Basic Metabolic Panel: Recent Labs  Lab 09/07/17 0842  NA 141  K 3.5  CL 102  CO2 28  GLUCOSE 178*  BUN 16  CREATININE 1.32*  CALCIUM 9.5   Liver Function Tests: Recent Labs  Lab 09/07/17 0842  AST 75*  ALT 104*  ALKPHOS 91  BILITOT 1.0  PROT 7.1  ALBUMIN 3.8   No results for input(s): LIPASE, AMYLASE in the last 168 hours. No results for input(s): AMMONIA in the last 168 hours.  CBC: Recent Labs  Lab 09/07/17 0842  WBC 5.4  NEUTROABS 3.0  HGB 13.1  HCT 42.3  MCV 95.5  PLT 162    Cardiac Enzymes: No results for input(s): CKTOTAL, CKMB, CKMBINDEX, TROPONINI in the last 168 hours.  BNP: BNP (last 3 results) Recent Labs    05/13/17 0317 05/27/17 2140 09/07/17 0842  BNP 38.0 67.8 177.7*  ProBNP (last 3 results) No results for input(s): PROBNP in the last 8760 hours.   CBG: No results for input(s): GLUCAP in the last 168 hours.  Coagulation Studies: No results for input(s): LABPROT, INR in the last 72 hours.   Imaging   Dg Chest 2 View  Result Date: 09/07/2017 CLINICAL DATA:  62 year old male with bilateral lower extremity swelling and oozing. Chronic right leg wound. Shortness of breath for 3 days. EXAM: CHEST - 2 VIEW COMPARISON:  Chest radiographs 05/27/2017 and earlier. FINDINGS: Stable mild cardiomegaly and mediastinal contours. Stable somewhat low lung volumes. No pneumothorax, pleural effusion or consolidation. Pulmonary vascularity appears increased compared to April. Stable visualized osseous structures. Negative visible  bowel gas pattern. IMPRESSION: 1. Increased pulmonary vascularity since April, suggesting mild or developing interstitial edema. 2. Stable mild cardiomegaly. Electronically Signed   By: [name]   On: 09/07/2017 08:59      Medications:     Current Medications:   Infusions: . vancomycin 1,000 mg (09/07/17 1113)       Patient Profile   [name] [name] is a 63 y.o. 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 Had history of acute renal failure in setting of rhabdo requiring short term HD.     Presented to [facility] 09/07/17 with worsening edema and SOB. Course complicated by draining leg wounds.   Assessment/Plan   1) Acute on chronic combined systolic/diastolic HF: EF 64-33%, grade II DD (10/2012) now decreased to 20-25%  (05/2015) RV severely reduced. Echo 4/19 EF 30-35% RV ok. Had [place] 2014 with minimal CAD. Holter showed <3% PVC burden.  - Echo 05/31/17 EF 25-30%, RV normal - NYHA IIIb.  - Volume status elevated on exam. - Would diurese with IV lasix 80 mg BID and follow response. He has had good response to dose given in ED earlier today.  - Continue eplerenone 25 mg daily (breast tenderness with spiro, now resolved) - Continue carvedilol 3.125 mg BID, for now.  - Continue Entresto 24/26 mg BID.  - Would repeat Echo. If EF remains <35%, will need ICD consideration, although with on-going wounds will likely have to defer, if determined to be candidate.  - Long history of non-compliance, but has repeatedly refused paramedicine.   2) HTN - Meds as above.  3) PAF:  - Continue Xarelto 20 mg daily. Denies bleeding.  - Tachy on exam.  4) ETOH abuse - Abstinent. No change.  5) CKD stage II - Cr relatively stable.  6) ?OSA - Referred for sleep study previously. Has not followed up.    7) DMII - Would cover with SSI in-house.  8) Medication Noncompliance - He has been referred to HF Paramedicine program, but declined when called to set up a visit. No change.  9) Frequent PVCs while inpatient - Holter monitor 06/17/17: 3% PVCs. No change.  10) RLE wound  - For last 8-9 days with drainage per patient. Would have IM/WOC see.  - Started on Vancomycin in ED.   We will see in consultation, as patient was scheduled to follow up in the HF clinic this afternoon.   Medication concerns reviewed with patient and pharmacy team. Barriers identified: Non-compliance  Length of Stay: 0  Shirley Friar, PA-C  09/07/2017, 12:01 PM  Advanced Heart Failure Team Pager [phone] (M-F; 7a - 4p)  Please contact  [facility] for night-coverage after hours (4p -7a ) and weekends on [email or web address]  Patient seen and examined with the above-signed Advanced Practice Provider and/or Housestaff. I personally reviewed laboratory data, imaging studies and relevant notes. I independently examined the patient and formulated the important aspects of the plan. I have edited the note to reflect any of my changes or salient points. I have personally discussed the plan with the patient and/or family.  62 y/o male with multiple medical problems including DM2 and severe systolic HF (EF 15-52%) due to NICM presents with RLE woun and significant volume overload.   Agree with lasix 80 IV bid. Watch renal function and electrolytes closely with diuresis.   Management of LE wound per primary team. Would consider WOC evaluation and ABIs.   Continue current HF regimen. Is not candidate for ICD with recurrent wounds.   The HF team will follow.   Glori Bickers, MD  6:16 PM

## 2017-09-07 NOTE — ED Notes (Signed)
Respiratory at bedside.

## 2017-09-07 NOTE — H&P (Signed)
History and Physical    [name] [name]:[id number] DOB: [birth year] DOA: 09/07/2017  PCP: Rogers Blocker, MD Consultants:  Pierre Part - cardiology Patient coming from:  Home - lives alone but his brother and sister check on him daily and he has an aide 7 days a week; NOK: Sister, [phone]  Chief Complaint: SOB and LE edema  HPI: [name] [name] is a 62 y.o. male with medical history significant of chronic systolic heart failure (EF 25-30% with diffuse hypokinesis in 5/19); HTN; afib on Xarelto; ETOH abuse in remission; cocaine abuse; Hep C; stage 2 CKD; probable OSA; DM; CVA; schizophrenia; and prostate CA presenting with SOB and LE edema.  RLE is infected and he has fluid in LLE.  He gets tired when he ants.  +orthopnea.  Symptoms have been going on for about 10 days.  9-10 pound weight gain.  No chest pain.  +PND.   ED Course:   Acute CHF exacerbation - symptoms for 2 weeks.  Elevated LFTs - ?hepatic congestion.  CHF team consulted and will continue to consult.  Review of Systems: As per HPI; otherwise review of systems reviewed and negative.   Ambulatory Status:  Ambulates without assistance  Past Medical History:  Diagnosis Date  . Atrial fibrillation ([place])   . Cancer [facility])    Prostate cancer-bx. 3 weeks ago  . Cataract   . Chronic combined systolic and diastolic CHF (congestive heart failure) (HCC)    a) EF 40-45% per 2D echo (02/2012) with grade 1 DD b)  NICM c) RHC (04/2012): RA: 4, RV 45/3/4, PA 42/9 (24), PCWP 14, Fick CO/CI: 5.2 /2.2, PVR 1.9 WU, PA 62% and 64% d) ECHO (10/2012) EF 40-45%, grade II DD, RV nl  . CKD (chronic kidney disease) stage 2, GFR 60-89 ml/min    BL SCr approximately 1-1.3  . Colitis 05/2009   History of colitis of ascending colon noted on CT abd/pelvis (05/2009), with interval resolution with subsequent CT  . Continuous chronic alcoholism ([place])   . Degenerative lumbar spinal stenosis    s/p L2-3, L3-4, L4-5 laminectomy partial facetectomy, and  bilateral foraminotomy  . Diabetes mellitus without complication ([place])    Type II  . Dysrhythmia    A. Fib  . Family history of adverse reaction to anesthesia    "sister, can't go to sleep"  . GERD (gastroesophageal reflux disease)   . H/O cocaine abuse   . Hepatic steatosis    suspected 2/2 alcohol abuse  . Hepatitis C   . History of pancreatitis 01/2011   Admission for acute pancreatitis presumed 2/2 ongoing alcohol abuse- and hypertriglyceridemia  . History of pneumonia   . HIT (heparin-induced thrombocytopenia) ([place])   . Hyperlipidemia   . Hypertension    uncontrolled with medication noncompliance  . Hypertriglyceridemia   . NICM (nonischemic cardiomyopathy) ([place])    a. LHC (04/2012): nl arteries  . PFO (patent foramen ovale) 01/2012   with right to left shunt, noted per TEE in evaluation for source of embolic stroke in 0/0938  . Rhabdomyolysis 02/22/2012   H/O rhabdomyolysis in 01/2012 that was idiopathic, cause never identified  . Schizophrenia, schizo-affective ([place])   . Shortness of breath dyspnea   . Splenic cyst   . Stroke ([place]) 01/2012   Small cerebellar infarcts right greater than left as well as questionable acute left external capsule and caudate nuclear punctate lacunar infarcts noted per MRI (01/2012) - presumed to be embolic likely source PFO with right to left  shunt (noted per TEE 01/ 2014)    Past Surgical History:  Procedure Laterality Date  . ACHILLES TENDON REPAIR Right 2007   "it was torn"  . BACK SURGERY    . CARDIAC CATHETERIZATION N/A 09/02/2015   Procedure: Right Heart Cath;  Surgeon: Jolaine Artist, MD;  Location: [facility];  Service: Cardiovascular;  Laterality: N/A;  . CATARACT EXTRACTION W/ INTRAOCULAR LENS IMPLANT Right   . ESOPHAGOGASTRODUODENOSCOPY N/A 06/25/2012   Procedure: ESOPHAGOGASTRODUODENOSCOPY (EGD);  Surgeon: Milus Banister, MD;  Location: [place] [facility];  Service: Endoscopy;  Laterality: N/A;  . I&D EXTREMITY  03/20/2011    Procedure: IRRIGATION AND DEBRIDEMENT EXTREMITY;  Surgeon: Kerin Salen, MD;  Location: [place];  Service: Orthopedics;  Laterality: Left;  I&D LEFT ACHILLIES TENDON  . KNEE ARTHROSCOPY Left   . LUMBAR LAMINECTOMY     L2-3, L3-4, L4-5 laminectomy, partial facetectomy  . RESECTION DISTAL CLAVICAL  09/17/2011   Procedure: RESECTION DISTAL CLAVICAL;  Surgeon: Nita Sells, MD;  Location: [place];  Service: Orthopedics;  Laterality: Right;  right shoulder arthroscopy with sad and open distal clavicle excision   . ROBOT ASSISTED LAPAROSCOPIC RADICAL PROSTATECTOMY N/A 12/16/2012   Procedure: ROBOTIC ASSISTED LAPAROSCOPIC PROSTATECTOMY ;  Surgeon: Ardis Hughs, MD;  Location: [facility];  Service: Urology;  Laterality: N/A;  . TONSILLECTOMY      Social History   Socioeconomic History  . Marital status: Single    Spouse name: Not on file  . Number of children: Not on file  . Years of education: 12th grade  . Highest education level: Not on file  Occupational History  . Occupation: Disability    Comment: 2/2 schizophrenia  Social Needs  . Financial resource strain: Not on file  . Food insecurity:    Worry: Not on file    Inability: Not on file  . Transportation needs:    Medical: Not on file    Non-medical: Not on file  Tobacco Use  . Smoking status: Former Smoker    Packs/day: 1.00    Years: 30.00    Pack years: 30.00    Types: Cigarettes    Last attempt to quit: 06/24/2001    Years since quitting: 16.2  . Smokeless tobacco: Never Used  Substance and Sexual Activity  . Alcohol use: Yes    Alcohol/week: 0.0 standard drinks    Comment: last use last week  . Drug use: Yes    Types: "Crack" cocaine    Comment: last used cocaine last week  . Sexual activity: Yes  Lifestyle  . Physical activity:    Days per week: Not on file    Minutes per session: Not on file  . Stress: Not on file  Relationships  . Social connections:    Talks on phone: Not on  file    Gets together: Not on file    Attends religious service: Not on file    Active member of club or organization: Not on file    Attends meetings of clubs or organizations: Not on file    Relationship status: Not on file  . Intimate partner violence:    Fear of current or ex partner: Not on file    Emotionally abused: Not on file    Physically abused: Not on file    Forced sexual activity: Not on file  Other Topics Concern  . Not on file  Social History Narrative   Lives in [place] alone, has a  [place] aide that helps with medications 4-5 days a week with medications, helping to clean.    Allergies  Allergen Reactions  . Heparin Other (See Comments)    Documented HIT under problem list Pt states he's not allergic. " They told me not take it anymore"  . Spironolactone Other (See Comments)    Breast tenderness  . Thorazine [Chlorpromazine Hcl] Other (See Comments)    Body freezes up    Family History  Problem Relation Age of Onset  . CAD Mother 51       deceased  . CAD Sister   . CAD Brother 38       died from MI at age 24yo  . Hypertension Unknown     Prior to Admission medications   Medication Sig Start Date End Date Taking? Authorizing Provider  carvedilol (COREG) 3.125 MG tablet Take 1 tablet (3.125 mg total) by mouth 2 (two) times daily with a meal. 06/03/17  Yes Tillery, Satira Mccallum, PA-C  eplerenone (INSPRA) 25 MG tablet Take 1 tablet (25 mg total) by mouth daily. 06/23/17  Yes Bensimhon, Shaune Pascal, MD  Insulin Glargine (LANTUS SOLOSTAR) 100 UNIT/ML Solostar Pen Inject 30 Units into the skin 2 (two) times daily. Patient taking differently: Inject 30 Units into the skin every morning.  06/03/17 09/07/17 Yes Elwin Mocha, MD  insulin regular (NOVOLIN R,HUMULIN R) 100 units/mL injection 3 times with meals.Take 30 minutes before meal. Patient taking differently: Inject 18 Units into the skin 3 (three) times daily before meals.  05/14/17 05/14/18 Yes Shelly Coss, MD   metFORMIN (GLUCOPHAGE) 500 MG tablet Take 500 mg by mouth every morning. 07/05/17  Yes [provider]  QUEtiapine (SEROQUEL) 400 MG tablet Take 1 tablet (400 mg total) by mouth 2 (two) times daily for 21 days. 06/03/17 09/07/17 Yes Elwin Mocha, MD  rivaroxaban (XARELTO) 20 MG TABS tablet Take 1 tablet (20 mg total) by mouth daily. 06/04/17  Yes Shirley Friar, PA-C  rosuvastatin (CRESTOR) 40 MG tablet Take 1 tablet (40 mg total) by mouth daily. 07/07/17  Yes Georgiana Shore, NP  sacubitril-valsartan (ENTRESTO) 24-26 MG Take 1 tablet by mouth 2 (two) times daily. 08/04/17  Yes Larey Dresser, MD  torsemide (DEMADEX) 20 MG tablet Take 3 tablets (60 mg total) by mouth daily. 06/23/17  Yes Bensimhon, Shaune Pascal, MD  ACCU-CHEK FASTCLIX LANCETS MISC Check blood sugar 4 times a day before meals and bedtime 02/16/17   [name], MD  B-D UF III MINI PEN NEEDLES 31G X 5 MM MISC Use four times daily as directed. Dx code E11.00, Z79.4 12/23/16   Aldine Contes, MD  Blood Glucose Monitoring Suppl (ACCU-CHEK GUIDE) w/Device KIT 1 each by Does not apply route 4 (four) times daily. 02/16/17   [name], MD  glucose blood (ACCU-CHEK GUIDE) test strip Check blood sugar 4 times a day before meal and bedtime 02/16/17   [name], MD    Physical Exam: Vitals:   09/07/17 1508 09/07/17 1533 09/07/17 1547 09/07/17 1616  BP: (!) 128/105  (!) 125/98   Pulse: (!) 110  (!) 109   Resp: 20  20   Temp:   98.1 F (36.7 C)   TempSrc:   Oral   SpO2: 92%  94%   Weight:  (!) 136.8 kg  (!) 136.8 kg  Height:    6' (1.829 m)     General: Appears calm and comfortable and is NAD; sitting up on the side of  the bed because it is easier to breathe this way Eyes:  PERRL, EOMI, normal lids, iris ENT:  grossly normal hearing, lips & tongue, mmm; poor dentition Neck:  no LAD, masses or thyromegaly; no carotid bruits Cardiovascular:  RRR, no m/r/g.  Respiratory:   CTA bilaterally with no  wheezes/rales/rhonchi.  Normal respiratory effort. Abdomen:  soft, NT, ND, NABS; + abdominal wall edema Back:   normal alignment, no CVAT; + back edema Skin:  Excoriation with significant lichenification and sloughing of LE, R > L     Musculoskeletal: grossly normal tone BUE/BLE, good ROM, no bony abnormality; pitting edema extending up to the abdomen, including the thighs Psychiatric:  grossly normal mood and affect, speech fluent and appropriate, AOx3 Neurologic:  CN 2-12 grossly intact, moves all extremities in coordinated fashion    Radiological Exams on Admission: Dg Chest 2 View  Result Date: 09/07/2017 CLINICAL DATA:  62 year old male with bilateral lower extremity swelling and oozing. Chronic right leg wound. Shortness of breath for 3 days. EXAM: CHEST - 2 VIEW COMPARISON:  Chest radiographs 05/27/2017 and earlier. FINDINGS: Stable mild cardiomegaly and mediastinal contours. Stable somewhat low lung volumes. No pneumothorax, pleural effusion or consolidation. Pulmonary vascularity appears increased compared to April. Stable visualized osseous structures. Negative visible bowel gas pattern. IMPRESSION: 1. Increased pulmonary vascularity since April, suggesting mild or developing interstitial edema. 2. Stable mild cardiomegaly. Electronically Signed   By: [name]   On: 09/07/2017 08:59    EKG: Independently reviewed.  Sinus tachycardia with ectopy with rate 109; nonspecific ST changes with no evidence of acute ischemia   Labs on Admission: I have personally reviewed the available labs and imaging studies at the time of the admission.  Pertinent labs:   Glucose 178 BUN 16/Creatinine 1.32/GFR >60 AST 75/ALT 104  BNP 177.7; prior was 67.8 on 5/2 Troponin 0.17, 0.12 - stable from prior Normal CBC    Assessment/Plan Principal Problem:   Acute on chronic systolic heart failure (HCC) Active Problems:   HTN (hypertension)   Cocaine abuse (HCC)   Schizophrenia (HCC)    Paroxysmal atrial fibrillation (HCC)   Diabetes (HCC)   Elevated troponin   CKD (chronic kidney disease), stage III (HCC)   Stasis dermatitis of both legs   Acute on chronic CHF with elevated troponin -Patient with known systolic heart failure presenting with worsening SOB and anasarca -CXR consistent with pulmonary edema -Normal WBC count, no fever; will not give antibiotics at this time -Elevated BNP -With elevated BNP and abnl CXR, recurrent CHF seems probable as diagnosis -Will admit with telemetry -Will request repeat echocardiogram -CHF order set utilized; CHF team consulting -Continue home meds: Coreg, Inspra, Entresto -Was given Lasix 40 mg x 1 in ER and will repeat with 80 mg IV BID -Continue [place] O2 for now -Stable kidney function at this time, will follow -Repeat EKG in AM -Will r/o with serial troponins although doubt ACS based on symptoms; it is chronically elevated -Increased LFTs are likely related to hepatic congestion; will recheck in AM  HTN -On Entresto and Coreg  Schizophrenia -Continue Seroquel  Afib -Rate controlled on Coreg -Continue Xarelto  DM -Very poor control, last A1c >14 in 4/19 -Will recheck A1c -hold Glucophage -Cover with moderate-scale SSI  CKD -Appears to be trending up, but not overly concerning for AKI at this time -Anticipate some improvement with diuresis and increased renal perfusion  Cocaine abuse -Reports last use last week -Recheck UDS -Encourage cessation  Stasis dermatitis -His  legs have excoriations and sloughing -Suspect stasis with dermatitis - likely also with fungal overlay -Will request wound care consult   DVT prophylaxis: Xarelto Code Status:  Full - confirmed with patient Family Communication: None present  Disposition Plan:  Home once clinically improved Consults called: Cardiology - CHF team; wound care/CM/PT Admission status: Admit - It is my clinical opinion that admission to INPATIENT is reasonable and  necessary because of the expectation that this patient will require hospital care that crosses at least 2 midnights to treat this condition based on the medical complexity of the problems presented.  Given the aforementioned information, the predictability of an adverse outcome is felt to be significant.     Karmen Bongo MD [facility]  If note is complete, please contact covering daytime or nighttime physician. [email or web address] Password [facility]  09/07/2017, 5:27 PM

## 2017-09-08 ENCOUNTER — Inpatient Hospital Stay (HOSPITAL_COMMUNITY): Payer: Medicare Other

## 2017-09-08 DIAGNOSIS — I5030 Unspecified diastolic (congestive) heart failure: Secondary | ICD-10-CM

## 2017-09-08 LAB — COMPREHENSIVE METABOLIC PANEL
ALBUMIN: 3.7 g/dL (ref 3.5–5.0)
ALK PHOS: 89 U/L (ref 38–126)
ALT: 91 U/L — ABNORMAL HIGH (ref 0–44)
ANION GAP: 10 (ref 5–15)
AST: 54 U/L — ABNORMAL HIGH (ref 15–41)
BILIRUBIN TOTAL: 0.9 mg/dL (ref 0.3–1.2)
BUN: 18 mg/dL (ref 8–23)
CALCIUM: 9.6 mg/dL (ref 8.9–10.3)
CO2: 28 mmol/L (ref 22–32)
Chloride: 101 mmol/L (ref 98–111)
Creatinine, Ser: 1.31 mg/dL — ABNORMAL HIGH (ref 0.61–1.24)
GFR, EST NON AFRICAN AMERICAN: 56 mL/min — AB (ref >60)
Glucose, Bld: 190 mg/dL — ABNORMAL HIGH (ref 70–99)
Potassium: 3.5 mmol/L (ref 3.5–5.1)
Sodium: 139 mmol/L (ref 135–145)
TOTAL PROTEIN: 7.2 g/dL (ref 6.5–8.1)

## 2017-09-08 LAB — CBC WITH DIFFERENTIAL/PLATELET
Abs Immature Granulocytes: 0 10*3/uL (ref 0.0–0.1)
BASOS ABS: 0 10*3/uL (ref 0.0–0.1)
BASOS PCT: 1 %
EOS ABS: 0.1 10*3/uL (ref 0.0–0.7)
EOS PCT: 2 %
HCT: 41.9 % (ref 39.0–52.0)
HEMOGLOBIN: 13 g/dL (ref 13.0–17.0)
Immature Granulocytes: 0 %
LYMPHS PCT: 32 %
Lymphs Abs: 1.5 10*3/uL (ref 0.7–4.0)
MCH: 29.3 pg (ref 26.0–34.0)
MCHC: 31 g/dL (ref 30.0–36.0)
MCV: 94.6 fL (ref 78.0–100.0)
MONO ABS: 0.6 10*3/uL (ref 0.1–1.0)
Monocytes Relative: 13 %
Neutro Abs: 2.5 10*3/uL (ref 1.7–7.7)
Neutrophils Relative %: 52 %
PLATELETS: 156 10*3/uL (ref 150–400)
RBC: 4.43 MIL/uL (ref 4.22–5.81)
RDW: 14.2 % (ref 11.5–15.5)
WBC: 4.8 10*3/uL (ref 4.0–10.5)

## 2017-09-08 LAB — ECHOCARDIOGRAM COMPLETE
HEIGHTINCHES: 72 in
WEIGHTICAEL: 4803.2 [oz_av]

## 2017-09-08 LAB — GLUCOSE, CAPILLARY
GLUCOSE-CAPILLARY: 167 mg/dL — AB (ref 70–99)
GLUCOSE-CAPILLARY: 234 mg/dL — AB (ref 70–99)
Glucose-Capillary: 174 mg/dL — ABNORMAL HIGH (ref 70–99)
Glucose-Capillary: 244 mg/dL — ABNORMAL HIGH (ref 70–99)

## 2017-09-08 MED ORDER — METOLAZONE 2.5 MG PO TABS
2.5000 mg | ORAL_TABLET | Freq: Once | ORAL | Status: AC
  Administered 2017-09-08: 2.5 mg via ORAL
  Filled 2017-09-08: qty 1

## 2017-09-08 MED ORDER — POTASSIUM CHLORIDE CRYS ER 20 MEQ PO TBCR
40.0000 meq | EXTENDED_RELEASE_TABLET | Freq: Two times a day (BID) | ORAL | Status: DC
  Administered 2017-09-08 – 2017-09-09 (×4): 40 meq via ORAL
  Filled 2017-09-08 (×4): qty 2

## 2017-09-08 MED ORDER — HYDROCERIN EX CREA
TOPICAL_CREAM | CUTANEOUS | Status: DC
  Administered 2017-09-08: 12:00:00 via TOPICAL
  Filled 2017-09-08: qty 113

## 2017-09-08 MED ORDER — PERFLUTREN LIPID MICROSPHERE
4.0000 mL | Freq: Once | INTRAVENOUS | Status: AC
  Administered 2017-09-08: 4 mL via INTRAVENOUS
  Filled 2017-09-08: qty 4

## 2017-09-08 MED ORDER — PERFLUTREN LIPID MICROSPHERE
INTRAVENOUS | Status: AC
  Administered 2017-09-08: 13:00:00
  Filled 2017-09-08: qty 10

## 2017-09-08 MED ORDER — DIGOXIN 125 MCG PO TABS
0.1250 mg | ORAL_TABLET | Freq: Every day | ORAL | Status: DC
  Administered 2017-09-08 – 2017-09-13 (×6): 0.125 mg via ORAL
  Filled 2017-09-08 (×6): qty 1

## 2017-09-08 NOTE — Consult Note (Signed)
[facility] Nurse wound consult note Reason for Consult: venous insufficiency with partial thickness skin loss at anterior (pretibial aspect) of LLE Wound type:Venous insufficiency Pressure Injury POA: N/A Measurement: RLE:  7cm x 3cm area of peeling and partial thickness tissue loss Wound HTV:GVSY, dry Drainage (amount, consistency, odor) small amount of serous exudate on old dressings Periwound:hemosiderin staining, edema (improving) Dressing procedure/placement/frequency: Patient would benefit from the gentle compression from [employer].  We will ask Ortho Tech to apply today.  Suggest HHRN for once weekly changes upon discharge. If you agree, please order/arrange with Care Management.   [place] nursing team will not follow, but will remain available to this patient, the nursing and medical teams.  Please re-consult if needed. Thanks, Maudie Flakes, MSN, RN, [place], Arther Abbott  Pager# [phone]

## 2017-09-08 NOTE — Progress Notes (Addendum)
Advanced Heart Failure Rounding Note  PCP-Cardiologist: No primary care provider on file.   Subjective:     Admitted with volume overload and RLE wound. Diuresing with IV lasix. Negative 1.7 liters.   Complaining leg edema but feels bloating is getting better. Denies fevers or chills. Breathing ok. No orthopnea or PND. Says he was drinking lots of fluids at home.    Objective:   Weight Range: (!) 136.2 kg Body mass index is 40.71 kg/m.   Vital Signs:   Temp:  [98 F (36.7 C)-99.1 F (37.3 C)] 98 F (36.7 C) (08/14 0538) Pulse Rate:  [66-114] 94 (08/14 0538) Resp:  [14-25] 18 (08/14 0538) BP: (99-128)/(64-105) 115/96 (08/14 0538) SpO2:  [92 %-100 %] 97 % (08/14 0538) Weight:  [136.2 kg-140.4 kg] 136.2 kg (08/14 0538) Last BM Date: 09/04/17  Weight change: Filed Weights   09/07/17 1533 09/07/17 1616 09/08/17 0538  Weight: (!) 136.8 kg (!) 136.8 kg (!) 136.2 kg    Intake/Output:   Intake/Output Summary (Last 24 hours) at 09/08/2017 0737 Last data filed at 09/08/2017 0538 Gross per 24 hour  Intake 840 ml  Output 2600 ml  Net -1760 ml      Physical Exam    General:  No resp difficulty. Sitting in the chair.  HEENT: Normal anicteric  Neck: Supple. JVP to jaw  . Carotids 2+ bilat; no bruits. No lymphadenopathy or thyromegaly appreciated. Cor: PMI laterally displaced. Regular tachy No rubs, gallops or murmurs. Lungs: Clear  Decreased at bases Abdomen: Obese soft, nontender, nondistended. No hepatosplenomegaly. No bruits or masses. Good bowel sounds. Extremities: No cyanosis, clubbing, rash, R and LLE 2+ woody edema. RLE dressing.  Neuro: alert & oriented x 3, cranial nerves grossly intact. moves all 4 extremities w/o difficulty. Affect pleasant   Telemetry   Sinus Tach 100s Personally reviewed   EKG    N/A  Labs    CBC Recent Labs    09/07/17 0842 09/08/17 0022  WBC 5.4 4.8  NEUTROABS 3.0 2.5  HGB 13.1 13.0  HCT 42.3 41.9  MCV 95.5 94.6  PLT  162 539   Basic Metabolic Panel Recent Labs    09/07/17 0842 09/08/17 0022  NA 141 139  K 3.5 3.5  CL 102 101  CO2 28 28  GLUCOSE 178* 190*  BUN 16 18  CREATININE 1.32* 1.31*  CALCIUM 9.5 9.6   Liver Function Tests Recent Labs    09/07/17 0842 09/08/17 0022  AST 75* 54*  ALT 104* 91*  ALKPHOS 91 89  BILITOT 1.0 0.9  PROT 7.1 7.2  ALBUMIN 3.8 3.7   No results for input(s): LIPASE, AMYLASE in the last 72 hours. Cardiac Enzymes Recent Labs    09/07/17 1339  TROPONINI 0.12*    BNP: BNP (last 3 results) Recent Labs    05/13/17 0317 05/27/17 2140 09/07/17 0842  BNP 38.0 67.8 177.7*    ProBNP (last 3 results) No results for input(s): PROBNP in the last 8760 hours.   D-Dimer No results for input(s): DDIMER in the last 72 hours. Hemoglobin A1C Recent Labs    09/07/17 1543  HGBA1C 8.3*   Fasting Lipid Panel No results for input(s): CHOL, HDL, LDLCALC, TRIG, CHOLHDL, LDLDIRECT in the last 72 hours. Thyroid Function Tests No results for input(s): TSH, T4TOTAL, T3FREE, THYROIDAB in the last 72 hours.  Invalid input(s): FREET3  Other results:   Imaging    Dg Chest 2 View  Result Date: 09/07/2017 CLINICAL DATA:  62 year old male with bilateral lower extremity swelling and oozing. Chronic right leg wound. Shortness of breath for 3 days. EXAM: CHEST - 2 VIEW COMPARISON:  Chest radiographs 05/27/2017 and earlier. FINDINGS: Stable mild cardiomegaly and mediastinal contours. Stable somewhat low lung volumes. No pneumothorax, pleural effusion or consolidation. Pulmonary vascularity appears increased compared to April. Stable visualized osseous structures. Negative visible bowel gas pattern. IMPRESSION: 1. Increased pulmonary vascularity since April, suggesting mild or developing interstitial edema. 2. Stable mild cardiomegaly. Electronically Signed   By: [name]   On: 09/07/2017 08:59      Medications:     Scheduled Medications: . aspirin EC  81 mg  Oral Daily  . carvedilol  3.125 mg Oral BID WC  . eplerenone  25 mg Oral Daily  . furosemide  80 mg Intravenous BID  . insulin aspart  0-15 Units Subcutaneous TID WC  . insulin aspart  0-5 Units Subcutaneous QHS  . insulin glargine  30 Units Subcutaneous Daily  . QUEtiapine  800 mg Oral QHS  . rivaroxaban  20 mg Oral Q breakfast  . rosuvastatin  40 mg Oral q1800  . sacubitril-valsartan  1 tablet Oral BID  . sodium chloride flush  3 mL Intravenous Q12H     Infusions: . sodium chloride       PRN Medications:  sodium chloride, acetaminophen, ondansetron (ZOFRAN) IV, sodium chloride flush    Patient Profile  [name] [name] is a 62 y.o. 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 Had history of acute renal failure in setting of rhabdo requiring short term HD.   [facility]ed [facility] with worsening edema and SOB. Course complicated by draining leg wounds.   Assessment/Plan  ) Acute on chronic combined systolic/diastolic HF: EF 19-37%, grade II DD (10/2012) now decreased to 20-25% (05/2015) RV severely reduced. Echo 4/19 EF 30-35% RV ok. Had [place] 2014 with minimal CAD. Holter showed <3% PVC burden. - Echo 05/31/17 EF 25-30%, RV normal - Weight up over 20 pounds. Remains overloaded. Continue 80 mg IV lasix twice a day and give 2.5 mg metolazone. Supp K.  -  Continue eplerenone 25 mg daily (breast tenderness with spiro) - Continue carvedilol 3.125 mg BID, for now. Add 0.125 mg digoxin.  -Continue Entresto 24/26 mg BID.  - Would repeat Echo. Not a candidate for ICD with open wounds.  - Long history of non-compliance, but has repeatedly refused paramedicine.  2) HTN -stable.  3) PAF:  - Continue Xarelto 20 mg daily.Denies bleeding.  Maintaining SR  4) ETOH abuse -  Abstinent.No change.  5) CKD stage II - Creatinine stable 1.3.  - BMET in am.   6) ?OSA -Referred for sleep study previously. Has not followed up.  7) DMII - Would cover with SSI in-house.  8) Medication Noncompliance - He has been referred to HF Paramedicine program, but declined when called to set up a visit.No change.  9) Frequent PVCs while inpatient - Holter monitor 06/17/17: 3% PVCs.  -Maintaining NSR 10) RLE wound  - For last 8-9 days with drainage per patient. Would have IM/WOC see.  - Started on Vancomycin in ED.    Medication concerns reviewed with patient and pharmacy team. Barriers identified: none  Length of Stay: 1  Amy Clegg, NP  09/08/2017, 7:37 AM  Advanced Heart Failure Team Pager [phone] (  M-F; 7a - 4p)  Please contact [facility] for night-coverage after hours (4p -7a ) and weekends on [email or web address]  Patient seen and examined with Darrick Grinder, NP. We discussed all aspects of the encounter. I agree with the assessment and plan as stated above.   Remains volume overloaded but responding well to IV lasix. Still markedly volume overloaded. Renal function stable. Wil continue diuresis. Supp K. Continue Xarelto. WOC for wound.   Glori Bickers, MD  11:08 AM

## 2017-09-08 NOTE — Progress Notes (Signed)
  Echocardiogram 2D Echocardiogram has been performed.  Madelaine Etienne 09/08/2017, 12:52 PM

## 2017-09-08 NOTE — Progress Notes (Signed)
Orthopedic Tech Progress Note Patient Details:  [name] [name] [birth year] [id number]  Ortho Devices Type of Ortho Device: Haematologist Ortho Device/Splint Location: bilateral Ortho Device/Splint Interventions: Application   Post Interventions Patient Tolerated: Well Instructions Provided: Care of device   [name] 09/08/2017, 1:52 PM

## 2017-09-08 NOTE — Progress Notes (Signed)
PROGRESS NOTE    [name] [name]  LGX:[id number] DOB: Dec 08, 1955 DOA: 09/07/2017 PCP: Rogers Blocker, MD  Brief Narrative: [name] [name] is a 62 y.o. male with medical history significant of chronic systolic heart failure (EF 25-30% with diffuse hypokinesis in 5/19); HTN; afib on Xarelto; ETOH abuse in remission; cocaine abuse; Hep C; stage 2 CKD; probable OSA; DM; CVA; schizophrenia; and prostate CA presenting with SOB and LE edema.  RLE is infected and he has fluid in LLE.  He gets tired when he ants.  +orthopnea.  Symptoms have been going on for about 10 days.  9-10 pound weight gain.  No chest pain.  +PND.   ED Course:   Acute CHF exacerbation - symptoms for 2 weeks.  Elevated LFTs - ?hepatic congestion.  CHF team consulted and will continue to consult.    Assessment & Plan:   Principal Problem:   Acute on chronic systolic heart failure (HCC) Active Problems:   HTN (hypertension)   Cocaine abuse (HCC)   Schizophrenia (HCC)   Paroxysmal atrial fibrillation (HCC)   Diabetes (HCC)   Elevated troponin   CKD (chronic kidney disease), stage III (HCC)   Stasis dermatitis of both legs   Acute on chronic systolic Heart failure exacerbation;  -Patient with known systolic heart failure presenting with worsening SOB and anasarca -CXR consistent with pulmonary edema -on lasix 80 mg IV BID.  Eplerenone, metolazone, entresto.  -negative 3 L.   HTN;  On entresto.   Schizophrenia;  Continue with Seroquel.   A fib;  Continue with carvedilol.   DM;  lantus and SSI.   CKD stage II;  Monitor renal function on lasix.   Cocaine use.   Stasis dermatitis; wound care consulted.    DVT prophylaxis: Xarelto.  Code Status: full code Family Communication: care discussed with patient.  Disposition Plan: remain inpatient for treatment of HF   Consultants:   Cardiology    Procedures:   ECHO;    Antimicrobials  none   Subjective: He denies chest pain.  Report  dyspnea.   Objective: Vitals:   09/08/17 0101 09/08/17 0538 09/08/17 0841 09/08/17 1222  BP: 123/86 (!) 115/96 120/70 115/74  Pulse: 96 94 90 (!) 49  Resp: 18 18  20   Temp: 98.4 F (36.9 C) 98 F (36.7 C)    TempSrc: Oral Oral    SpO2: 95% 97% 98% 96%  Weight:  (!) 136.2 kg    Height:        Intake/Output Summary (Last 24 hours) at 09/08/2017 1623 Last data filed at 09/08/2017 1415 Gross per 24 hour  Intake 1320 ml  Output 3050 ml  Net -1730 ml   Filed Weights   09/07/17 1533 09/07/17 1616 09/08/17 0538  Weight: (!) 136.8 kg (!) 136.8 kg (!) 136.2 kg    Examination:  General exam: Appears calm and comfortable  Respiratory system: Clear to auscultation. Respiratory effort normal. Cardiovascular system: S1 & S2 heard, RRR. No JVD, murmurs, rubs, gallops or clicks. No pedal edema. Gastrointestinal system: Abdomen is nondistended, soft and nontender. No organomegaly or masses felt. Normal bowel sounds heard. Central nervous system: Alert and oriented. No focal neurological deficits. Extremities: Symmetric 5 x 5 power. Lower extremities with edema, excoriation and sloughing LE     Data Reviewed: I have personally reviewed following labs and imaging studies  CBC: Recent Labs  Lab 09/07/17 0842 09/08/17 0022  WBC 5.4 4.8  NEUTROABS 3.0 2.5  HGB 13.1 13.0  HCT  42.3 41.9  MCV 95.5 94.6  PLT 162 412   Basic Metabolic Panel: Recent Labs  Lab 09/07/17 0842 09/08/17 0022  NA 141 139  K 3.5 3.5  CL 102 101  CO2 28 28  GLUCOSE 178* 190*  BUN 16 18  CREATININE 1.32* 1.31*  CALCIUM 9.5 9.6   GFR: Estimated Creatinine Clearance: 82.5 mL/min (A) (by C-G formula based on SCr of 1.31 mg/dL (H)). Liver Function Tests: Recent Labs  Lab 09/07/17 0842 09/08/17 0022  AST 75* 54*  ALT 104* 91*  ALKPHOS 91 89  BILITOT 1.0 0.9  PROT 7.1 7.2  ALBUMIN 3.8 3.7   No results for input(s): LIPASE, AMYLASE in the last 168 hours. No results for input(s): AMMONIA in the  last 168 hours. Coagulation Profile: No results for input(s): INR, PROTIME in the last 168 hours. Cardiac Enzymes: Recent Labs  Lab 09/07/17 1339  TROPONINI 0.12*   BNP (last 3 results) No results for input(s): PROBNP in the last 8760 hours. HbA1C: Recent Labs    09/07/17 1543  HGBA1C 8.3*   CBG: Recent Labs  Lab 09/07/17 1612 09/07/17 2104 09/08/17 0747 09/08/17 1118  GLUCAP 260* 217* 167* 234*   Lipid Profile: No results for input(s): CHOL, HDL, LDLCALC, TRIG, CHOLHDL, LDLDIRECT in the last 72 hours. Thyroid Function Tests: No results for input(s): TSH, T4TOTAL, FREET4, T3FREE, THYROIDAB in the last 72 hours. Anemia Panel: No results for input(s): VITAMINB12, FOLATE, FERRITIN, TIBC, IRON, RETICCTPCT in the last 72 hours. Sepsis Labs: No results for input(s): PROCALCITON, LATICACIDVEN in the last 168 hours.  No results found for this or any previous visit (from the past 240 hour(s)).       Radiology Studies: Dg Chest 2 View  Result Date: 09/07/2017 CLINICAL DATA:  62 year old male with bilateral lower extremity swelling and oozing. Chronic right leg wound. Shortness of breath for 3 days. EXAM: CHEST - 2 VIEW COMPARISON:  Chest radiographs 05/27/2017 and earlier. FINDINGS: Stable mild cardiomegaly and mediastinal contours. Stable somewhat low lung volumes. No pneumothorax, pleural effusion or consolidation. Pulmonary vascularity appears increased compared to April. Stable visualized osseous structures. Negative visible bowel gas pattern. IMPRESSION: 1. Increased pulmonary vascularity since April, suggesting mild or developing interstitial edema. 2. Stable mild cardiomegaly. Electronically Signed   By: [name]   On: 09/07/2017 08:59        Scheduled Meds: . aspirin EC  81 mg Oral Daily  . carvedilol  3.125 mg Oral BID WC  . digoxin  0.125 mg Oral Daily  . eplerenone  25 mg Oral Daily  . furosemide  80 mg Intravenous BID  . hydrocerin   Topical Weekly  .  insulin aspart  0-15 Units Subcutaneous TID WC  . insulin aspart  0-5 Units Subcutaneous QHS  . insulin glargine  30 Units Subcutaneous Daily  . potassium chloride  40 mEq Oral BID  . QUEtiapine  800 mg Oral QHS  . rivaroxaban  20 mg Oral Q breakfast  . rosuvastatin  40 mg Oral q1800  . sacubitril-valsartan  1 tablet Oral BID  . sodium chloride flush  3 mL Intravenous Q12H   Continuous Infusions: . sodium chloride       LOS: 1 day    Time spent: 35 minutes.     Elmarie Shiley, MD [facility] Pager [phone]  If 7PM-7AM, please contact night-coverage [email or web address] Password [facility] 09/08/2017, 4:23 PM

## 2017-09-08 NOTE — Progress Notes (Signed)
Patient's legs washed and cream applied per order.

## 2017-09-08 NOTE — Evaluation (Signed)
Physical Therapy Evaluation Patient Details Name: [name] [name] MRN: [id number] DOB: 1955/05/27 Today's Date: 09/08/2017   History of Present Illness  [name] [name] is a 62 y.o. male with medical history significant of chronic systolic heart failure (EF 25-30% with diffuse hypokinesis in 5/19); HTN; afib on Xarelto; ETOH abuse in remission; cocaine abuse; Hep C; stage 2 CKD; probable OSA; DM; CVA; schizophrenia; and prostate CA presenting with SOB and LE edema.  RLE is infected and he has fluid in LLE.    Clinical Impression  Arrived to pt already sitting up in chair and report that had previously walked around the room. Pt lives alone in apartment with two hours a day with aide and assistance from brother and sister. Pt agreeable to ambulation without use of walker or cane, but limited by R LE pain and SOB. Discussed future use of RW with mobility to alleviate pressure on foot while in hospital, with pt agreeing. Pt demonstrated bil decreased strength and functional mobility, with limited activity tolerance secondary to pain and deconditioning. Pt will benefit from skilled therapy to address above deficits to help increase functional mobility, activity tolerance and decrease fall risks.      Follow Up Recommendations Home health PT;Supervision - Intermittent    Equipment Recommendations  None recommended by PT    Recommendations for Other Services       Precautions / Restrictions Precautions Precautions: Fall Restrictions Weight Bearing Restrictions: No(pt encouraged to use RW to decrease weight on R LE to assist pain)      Mobility  Bed Mobility               General bed mobility comments: pt in chair on arrival   Transfers Overall transfer level: Modified independent Equipment used: None             General transfer comment: use of chair arms to rise   Ambulation/Gait Ambulation/Gait assistance: Min guard Gait Distance (Feet): 75 Feet Assistive device:  None Gait Pattern/deviations: Antalgic;Step-through pattern;Decreased stride length Gait velocity: decreased  Gait velocity interpretation: 1.31 - 2.62 ft/sec, indicative of limited community ambulator General Gait Details: pt denied use of walker or cane, but typically uses 4pt cane at home. pt would benefit from RW for balance and to decrease WB on R foot for pain management. recommended to use moving forward and pt agreed. pain 7/10 at rest and 9/10 after ambulation   Stairs            Wheelchair Mobility    Modified Rankin (Stroke Patients Only)       Balance Overall balance assessment: Needs assistance Sitting-balance support: No upper extremity supported Sitting balance-Leahy Scale: Fair     Standing balance support: No upper extremity supported Standing balance-Leahy Scale: Fair                               Pertinent Vitals/Pain Pain Assessment: 0-10 Pain Score: 7  Pain Location: R LE and back Pain Descriptors / Indicators: Throbbing Pain Intervention(s): Monitored during session    Home Living Family/patient expects to be discharged to:: Private residence Living Arrangements: Alone Available Help at Discharge: Family;Personal care attendant;Available PRN/intermittently Type of Home: Apartment Home Access: Stairs to enter Entrance Stairs-Rails: Right;Left;Can reach both Entrance Stairs-Number of Steps: 3 Home Layout: One level Home Equipment: Cane - single point;Grab bars - tub/shower;Shower seat;Walker - 2 wheels      Prior Function Level of Independence: Needs  assistance   Gait / Transfers Assistance Needed: uses cane/RW as needed  ADL's / Homemaking Assistance Needed: aide assist with bathing  Comments: family does grocery shopping, aide 2hr, 7x/week, and family visits daily     Hand Dominance   Dominant Hand: Right    Extremity/Trunk Assessment   Upper Extremity Assessment Upper Extremity Assessment: Overall WFL for tasks  assessed    Lower Extremity Assessment Lower Extremity Assessment: Generalized weakness;RLE deficits/detail;LLE deficits/detail RLE Deficits / Details: infection, edema. bil hip flexion 4/5, hip add/ab 5/5, L LE knee extension and flexion 3+/5, R LE knee ext/flexion not tested/open wound        Communication   Communication: No difficulties  Cognition Arousal/Alertness: Awake/alert Behavior During Therapy: Flat affect Overall Cognitive Status: Within Functional Limits for tasks assessed                                 General Comments: h/o schizophrenia      General Comments General comments (skin integrity, edema, etc.): bil LE edema with infection/wound on R calf    Exercises     Assessment/Plan    PT Assessment Patient needs continued PT services  PT Problem List Decreased strength;Decreased mobility;Decreased safety awareness;Decreased activity tolerance;Decreased balance;Decreased knowledge of use of DME;Decreased skin integrity       PT Treatment Interventions DME instruction;Therapeutic activities;Gait training;Therapeutic exercise;Patient/family education;Balance training;Functional mobility training;Stair training    PT Goals (Current goals can be found in the Care Plan section)  Acute Rehab PT Goals Patient Stated Goal: return home  PT Goal Formulation: With patient Time For Goal Achievement: 09/22/17 Potential to Achieve Goals: Good    Frequency Min 2X/week   Barriers to discharge        Co-evaluation               AM-PAC PT "6 Clicks" Daily Activity  Outcome Measure Difficulty turning over in bed (including adjusting bedclothes, sheets and blankets)?: A Little Difficulty moving from lying on back to sitting on the side of the bed? : A Little Difficulty sitting down on and standing up from a chair with arms (e.g., wheelchair, bedside commode, etc,.)?: A Little Help needed moving to and from a bed to chair (including a wheelchair)?:  A Little Help needed walking in hospital room?: A Little Help needed climbing 3-5 steps with a railing? : A Lot 6 Click Score: 17    End of Session Equipment Utilized During Treatment: Gait belt Activity Tolerance: Patient tolerated treatment well;Patient limited by pain;Patient limited by fatigue Patient left: in chair;with call bell/phone within reach;with nursing/sitter in room Nurse Communication: Mobility status;Weight bearing status PT Visit Diagnosis: Other abnormalities of gait and mobility (R26.89);Muscle weakness (generalized) (M62.81)    Time: 9147-8295 PT Time Calculation (min) (ACUTE ONLY): 16 min   Charges:   PT Evaluation $PT Eval Low Complexity: [place], [place]  Acute Rehab [phone]   Samuella Bruin 09/08/2017, 2:27 PM

## 2017-09-08 NOTE — Progress Notes (Signed)
Inpatient Diabetes Program Recommendations  AACE/ADA: New Consensus Statement on Inpatient Glycemic Control (2015)  Target Ranges:  Prepandial:   less than 140 mg/dL      Peak postprandial:   less than 180 mg/dL (1-2 hours)      Critically ill patients:  140 - 180 mg/dL   Lab Results  Component Value Date   GLUCAP 234 (H) 09/08/2017   HGBA1C 8.3 (H) 09/07/2017    Review of Glycemic ControlResults for [name] [name], [name] [name] (MRN [id number]) as of 09/08/2017 11:53  Ref. Range 09/07/2017 16:12 09/07/2017 21:04 09/08/2017 07:47 09/08/2017 11:18  Glucose-Capillary Latest Ref Range: 70 - 99 mg/dL 260 (H) 217 (H) 167 (H) 234 (H)    Diabetes history: Type 2 DM Outpatient Diabetes medications:  Lantus 30 units bid, Novolin R- 18 units tid with meals, Metformin 500 mg daily Current orders for Inpatient glycemic control:  Novolog moderate tid with meals and HS Lantus 30 units daily Inpatient Diabetes Program Recommendations:    Please consider increasing Lantus to 20 units bid.  Also consider adding Novolog 8 units tid with meals (hold if patient eats less than 50%).   Thanks,  Adah Perl, RN, BC-ADM Inpatient Diabetes Coordinator Pager [phone] (8a-5p)

## 2017-09-09 LAB — BASIC METABOLIC PANEL
ANION GAP: 11 (ref 5–15)
BUN: 15 mg/dL (ref 8–23)
CALCIUM: 10.4 mg/dL — AB (ref 8.9–10.3)
CHLORIDE: 99 mmol/L (ref 98–111)
CO2: 29 mmol/L (ref 22–32)
CREATININE: 1.11 mg/dL (ref 0.61–1.24)
GFR calc Af Amer: >60 mL/min (ref >60)
GFR calc non Af Amer: >60 mL/min (ref >60)
GLUCOSE: 183 mg/dL — AB (ref 70–99)
Potassium: 3.9 mmol/L (ref 3.5–5.1)
Sodium: 139 mmol/L (ref 135–145)

## 2017-09-09 LAB — GLUCOSE, CAPILLARY
Glucose-Capillary: 163 mg/dL — ABNORMAL HIGH (ref 70–99)
Glucose-Capillary: 241 mg/dL — ABNORMAL HIGH (ref 70–99)
Glucose-Capillary: 211 mg/dL — ABNORMAL HIGH (ref 70–99)
Glucose-Capillary: 215 mg/dL — ABNORMAL HIGH (ref 70–99)

## 2017-09-09 MED ORDER — POLYETHYLENE GLYCOL 3350 17 G PO PACK
17.0000 g | PACK | Freq: Two times a day (BID) | ORAL | Status: DC
  Administered 2017-09-09 – 2017-09-13 (×4): 17 g via ORAL
  Filled 2017-09-09 (×7): qty 1

## 2017-09-09 MED ORDER — INSULIN GLARGINE 100 UNIT/ML ~~LOC~~ SOLN
20.0000 [IU] | Freq: Two times a day (BID) | SUBCUTANEOUS | Status: DC
  Administered 2017-09-10: 20 [IU] via SUBCUTANEOUS
  Filled 2017-09-09: qty 0.2

## 2017-09-09 MED ORDER — METOLAZONE 2.5 MG PO TABS
2.5000 mg | ORAL_TABLET | Freq: Once | ORAL | Status: AC
  Administered 2017-09-09: 2.5 mg via ORAL
  Filled 2017-09-09: qty 1

## 2017-09-09 NOTE — Consult Note (Signed)
   [facility] Inpatient Consult   09/09/2017  [name] [name] October 27, 1955 [id number]   Patient was assessed for [place] Management for community services. Patient was previously with a outreach by [place] [facility] telephonic nurse for [facility] and also [facility] Pharmacist in the past.  Met with patient at bedside regarding being started with [facility] services.  Patient states he has a scale but doesn't know how accurate it is. Explained to patient about the [facility] tele-monitoring scale program and he is interested. Also, told patient about the Virtual MD program but patient does not have a cell phone.  Patient states he has an aide that comes 5 days a week for 2 hours a day and she assist with his personal ADLs needs.  No current issues with transportation. A signed consent was obtained and folder with [place] Management information given.   Will follow for progress and assign as appropriate for transitioning to community.  His primary care providers are listed to provide the transition of care follow up calls and appointments.  Of note, [facility] Care Management services does not replace or interfere with any services that are arranged by inpatient case management or social work. For additional questions or referrals please contact:  Natividad Brood, RN BSN [facility] Liaison  [phone] business mobile phone Toll free office [phone]

## 2017-09-09 NOTE — Progress Notes (Signed)
CARDIAC REHAB PHASE I   PRE:  Rate/Rhythm: 92 SR with PACs    BP: sitting 92/67    SaO2: 97 RA  MODE:  Ambulation: 3190 ft   POST:  Rate/Rhythm: 118 ST with PVCs    BP: sitting 120/80     SaO2: 93 RA  Pt able to stand independently and walk with RW, standby assist. Fairly steady. Difficult to stay inside RW at times due to wide shoulders.  Pt more SOB and fatigued with distance. He sts this feels more like deconditioning that his normal SOB. Pt happy with ambulation, sts he is improved. Return to recliner, feet elevated. Asked for grape juice. Discussed quenching thirst with ice. Pt receptive. [place], ACSM 09/09/2017 2:55 PM

## 2017-09-09 NOTE — Care Management Note (Signed)
Case Management Note  Patient Details  Name: [name] [name] MRN: [id number] Date of Birth: 1955/10/16  Subjective/Objective:   CHF                Action/Plan: Noted admitted 3 times in 6 months Patient lives at home alone; PCP: Rogers Blocker, MD; has private insurance with [facility] with prescription drug coverage; pharmacy of choice is [facility]; pt reports no problem getting his medication; he has a personal caregiver in the home to assist with his care 5 days a week for 2.5 hrs; his sister / SCAT takes him to his apts. DME - walker, cane at home; he has scales at home and knows to weigh himself daily; his aide cooks for him and does not use salt. Patient could benefit from Disease Management Program for CHF; [place] choice offered, pt chose [facility]; Dan with [facility] called for arrangements. CM will continue to follow for progression of care.  Expected Discharge Date:    possibly 09/13/2017              Expected Discharge Plan:  [place]  Discharge planning Services  CM Consult  Choice offered to:  Patient  HH Arranged:  RN, Disease Management, PT [place] Agency:  [place]  Status of Service:  In process, will continue to follow  Sherrilyn Rist [phone] 09/09/2017, 10:56 AM

## 2017-09-09 NOTE — Progress Notes (Addendum)
Advanced Heart Failure Rounding Note  PCP-Cardiologist: No primary care provider on file.   Subjective:     Admitted with volume overload and RLE wound.   Yesterday diuresed with IV lasix + metolazone. Brisk diuresis noted. Neg 3 liters. Weight down 7 pounds.   Feeling better. Denies SOB.  No orthopnea or PND. No dizziness. Creatinine stable.     Objective:   Weight Range: 133.2 kg Body mass index is 39.83 kg/m.   Vital Signs:   Temp:  [97.7 F (36.5 C)-98.5 F (36.9 C)] 97.9 F (36.6 C) (08/15 0444) Pulse Rate:  [46-102] 102 (08/15 0444) Resp:  [18-30] 30 (08/15 0444) BP: (80-132)/(61-74) 109/69 (08/15 0444) SpO2:  [96 %-98 %] 97 % (08/15 0444) Weight:  [133.2 kg] 133.2 kg (08/15 0444) Last BM Date: 09/04/17  Weight change: Filed Weights   09/07/17 1616 09/08/17 0538 09/09/17 0444  Weight: (!) 136.8 kg (!) 136.2 kg 133.2 kg    Intake/Output:   Intake/Output Summary (Last 24 hours) at 09/09/2017 0720 Last data filed at 09/08/2017 2358 Gross per 24 hour  Intake 1200 ml  Output 4275 ml  Net -3075 ml      Physical Exam  General:   No resp difficulty. Sitting in the chair.  HEENT: normal anicteric Neck: supple. JVP ~10 . Carotids 2+ bilat; no bruits. No lymphadenopathy or thryomegaly appreciated. Cor: PMI laterally displaced. Regular tachy . No rubs, gallops or murmurs. Lungs: clear no wheeze Abdomen: obese soft, nontender, nondistended. No hepatosplenomegaly. No bruits or masses. Good bowel sounds. Extremities: no cyanosis, clubbing, rash, RLE dressing. RLE/LLE unna boots Neuro: alert & orientedx3, cranial nerves grossly intact. moves all 4 extremities w/o difficulty. Affect pleasant  Telemetry   NSR/ST 95-105 Personally reviewed    EKG    N/A  Labs    CBC Recent Labs    09/07/17 0842 09/08/17 0022  WBC 5.4 4.8  NEUTROABS 3.0 2.5  HGB 13.1 13.0  HCT 42.3 41.9  MCV 95.5 94.6  PLT 162 299   Basic Metabolic Panel Recent Labs   09/08/17 0022 09/09/17 0515  NA 139 139  K 3.5 3.9  CL 101 99  CO2 28 29  GLUCOSE 190* 183*  BUN 18 15  CREATININE 1.31* 1.11  CALCIUM 9.6 10.4*   Liver Function Tests Recent Labs    09/07/17 0842 09/08/17 0022  AST 75* 54*  ALT 104* 91*  ALKPHOS 91 89  BILITOT 1.0 0.9  PROT 7.1 7.2  ALBUMIN 3.8 3.7   No results for input(s): LIPASE, AMYLASE in the last 72 hours. Cardiac Enzymes Recent Labs    09/07/17 1339  TROPONINI 0.12*    BNP: BNP (last 3 results) Recent Labs    05/13/17 0317 05/27/17 2140 09/07/17 0842  BNP 38.0 67.8 177.7*    ProBNP (last 3 results) No results for input(s): PROBNP in the last 8760 hours.   D-Dimer No results for input(s): DDIMER in the last 72 hours. Hemoglobin A1C Recent Labs    09/07/17 1543  HGBA1C 8.3*   Fasting Lipid Panel No results for input(s): CHOL, HDL, LDLCALC, TRIG, CHOLHDL, LDLDIRECT in the last 72 hours. Thyroid Function Tests No results for input(s): TSH, T4TOTAL, T3FREE, THYROIDAB in the last 72 hours.  Invalid input(s): FREET3  Other results:   Imaging    No results found.   Medications:     Scheduled Medications: . aspirin EC  81 mg Oral Daily  . carvedilol  3.125 mg Oral BID WC  .  digoxin  0.125 mg Oral Daily  . eplerenone  25 mg Oral Daily  . furosemide  80 mg Intravenous BID  . hydrocerin   Topical Weekly  . insulin aspart  0-15 Units Subcutaneous TID WC  . insulin aspart  0-5 Units Subcutaneous QHS  . insulin glargine  30 Units Subcutaneous Daily  . potassium chloride  40 mEq Oral BID  . QUEtiapine  800 mg Oral QHS  . rivaroxaban  20 mg Oral Q breakfast  . rosuvastatin  40 mg Oral q1800  . sacubitril-valsartan  1 tablet Oral BID  . sodium chloride flush  3 mL Intravenous Q12H    Infusions: . sodium chloride      PRN Medications: sodium chloride, acetaminophen, ondansetron (ZOFRAN) IV, sodium chloride flush    Patient Profile  [name] [name] is a 62 y.o. 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 Had history of acute renal failure in setting of rhabdo requiring short term HD.   Presented to [facility] 09/07/17 with worsening edema and SOB. Course complicated by draining leg wounds.   Assessment/Plan  ) Acute on chronic combined systolic/diastolic HF: EF 60-73%, grade II DD (10/2012) now decreased to 20-25% (05/2015) RV severely reduced. Echo 4/19 EF 30-35% RV ok. Had [place] 2014 with minimal CAD. Holter showed <3% PVC burden. - Echo 05/31/17 EF 25-30%, RV normal - Echo 814/19 EF 25-30% - Brisk diuresis noted. Weight down 7 pounds. Continue 80 mg IV lasix twice a day + 2.5 mg metolazone. Supp K. One more day of aggressive diuresis.  -  Continue eplerenone 25 mg daily (breast tenderness with spiro) - Continue carvedilol 3.125 mg BID, for now. Add 0.125 mg digoxin.  -Continue Entresto 24/26 mg BID.   Not a candidate for ICD with open wounds.  - Long history of non-compliance, but has repeatedly refused paramedicine.  2) HTN -stable.  3) PAF:  - Continue Xarelto 20 mg daily. -Maintaining NSR.  Maintaining SR  4) ETOH abuse - Abstinent.No change.  5) CKD stage II - Renal function stable.  - BMET in am. .   6) ?OSA -Referred for sleep study previously. Has not followed up.  7) DMII - Would cover with SSI in-house.  8) Medication Noncompliance - He has been referred to HF Paramedicine program, but declined when called to set up a visit.No change.  9) Frequent PVCs while inpatient - Holter monitor 06/17/17: 3% PVCs.  -Maintaining NSR 10) RLE wound  -  Started on Vancomycin in ED. WOC consulted.    Medication concerns reviewed with patient and pharmacy team. Barriers identified: none  Length of Stay: 2  Amy Clegg, NP  09/09/2017, 7:20  AM  Advanced Heart Failure Team Pager [phone] (M-F; 7a - 4p)  Please contact [facility] for night-coverage after hours (4p -7a ) and weekends on [email or web address]  .Patient seen and examined with Darrick Grinder, NP. We discussed all aspects of the encounter. I agree with the assessment and plan as stated above.   He is diuresing briskly and beginning to feel better. Echo reviewed personally. EF stable 25-30%. Still with mild fluid overload. Continue IV diuresis. Supp K. WOC following.   Glori Bickers, MD  8:55 AM

## 2017-09-09 NOTE — Progress Notes (Signed)
PROGRESS NOTE    [name] [name]  WRU:[id number] DOB: 06/30/1955 DOA: 09/07/2017 PCP: Rogers Blocker, MD  Brief Narrative: [name] [name] is a 62 y.o. male with medical history significant of chronic systolic heart failure (EF 25-30% with diffuse hypokinesis in 5/19); HTN; afib on Xarelto; ETOH abuse in remission; cocaine abuse; Hep C; stage 2 CKD; probable OSA; DM; CVA; schizophrenia; and prostate CA presenting with SOB and LE edema.  RLE is infected and [name] [name] has fluid in LLE.  [name] [name] gets tired when [name] [name] ants.  +orthopnea.  Symptoms have been going on for about 10 days.  9-10 pound weight gain.  No chest pain.  +PND.   ED Course:   Acute CHF exacerbation - symptoms for 2 weeks.  Elevated LFTs - ?hepatic congestion.  CHF team consulted and will continue to consult.    Assessment & Plan:   Principal Problem:   Acute on chronic systolic heart failure (HCC) Active Problems:   HTN (hypertension)   Cocaine abuse (HCC)   Schizophrenia (HCC)   Paroxysmal atrial fibrillation (HCC)   Diabetes (HCC)   Elevated troponin   CKD (chronic kidney disease), stage III (HCC)   Stasis dermatitis of both legs   Acute on chronic systolic Heart failure exacerbation;  -Patient with known systolic heart failure presenting with worsening SOB and anasarca -CXR consistent with pulmonary edema -on lasix 80 mg IV BID.  Eplerenone, metolazone, entresto.  -negative 7 L.  Feeling better, dyspnea improved.   HTN;  On entresto.   Schizophrenia;  Continue with Seroquel.   A fib;  Continue with carvedilol.   DM;  lantus and SSI.  Change lantus to BID starting tomorrow.   CKD stage II;  Monitor renal function on lasix.   Cocaine use. Counseling provided.   Stasis dermatitis; wound care consulted.  Unna boot in place.    DVT prophylaxis: Xarelto.  Code Status: full code Family Communication: care discussed with patient.  Disposition Plan: remain inpatient for treatment of HF   Consultants:    Cardiology    Procedures:   ECHO;    Antimicrobials  none   Subjective: [name] [name] is feeling better, lower extremity fluid has decreased/    Objective: Vitals:   09/09/17 0001 09/09/17 0444 09/09/17 0823 09/09/17 1201  BP: 108/61 109/69 108/69 104/65  Pulse: (!) 46 (!) 102 84 (!) 47  Resp: (!) 30 (!) 30    Temp: 97.7 F (36.5 C) 97.9 F (36.6 C)  98 F (36.7 C)  TempSrc: Oral Oral  Oral  SpO2: 98% 97%  91%  Weight:  133.2 kg    Height:        Intake/Output Summary (Last 24 hours) at 09/09/2017 1418 Last data filed at 09/09/2017 1201 Gross per 24 hour  Intake 720 ml  Output 4875 ml  Net -4155 ml   Filed Weights   09/07/17 1616 09/08/17 0538 09/09/17 0444  Weight: (!) 136.8 kg (!) 136.2 kg 133.2 kg    Examination:  General exam: NAD  Respiratory system: CTA Cardiovascular system:S 1, S 2  Gastrointestinal system: BS present, soft, nt Central nervous system: non focal.  Extremities: Bilateral LE with less edema, unna boot in place.    Data Reviewed: I have personally reviewed following labs and imaging studies  CBC: Recent Labs  Lab 09/07/17 0842 09/08/17 0022  WBC 5.4 4.8  NEUTROABS 3.0 2.5  HGB 13.1 13.0  HCT 42.3 41.9  MCV 95.5 94.6  PLT 162 156   Basic  Metabolic Panel: Recent Labs  Lab 09/07/17 0842 09/08/17 0022 09/09/17 0515  NA 141 139 139  K 3.5 3.5 3.9  CL 102 101 99  CO2 28 28 29   GLUCOSE 178* 190* 183*  BUN 16 18 15   CREATININE 1.32* 1.31* 1.11  CALCIUM 9.5 9.6 10.4*   GFR: Estimated Creatinine Clearance: 96.2 mL/min (by C-G formula based on SCr of 1.11 mg/dL). Liver Function Tests: Recent Labs  Lab 09/07/17 0842 09/08/17 0022  AST 75* 54*  ALT 104* 91*  ALKPHOS 91 89  BILITOT 1.0 0.9  PROT 7.1 7.2  ALBUMIN 3.8 3.7   No results for input(s): LIPASE, AMYLASE in the last 168 hours. No results for input(s): AMMONIA in the last 168 hours. Coagulation Profile: No results for input(s): INR, PROTIME in the last 168  hours. Cardiac Enzymes: Recent Labs  Lab 09/07/17 1339  TROPONINI 0.12*   BNP (last 3 results) No results for input(s): PROBNP in the last 8760 hours. HbA1C: Recent Labs    09/07/17 1543  HGBA1C 8.3*   CBG: Recent Labs  Lab 09/08/17 1118 09/08/17 1632 09/08/17 2100 09/09/17 0739 09/09/17 1156  GLUCAP 234* 174* 244* 163* 241*   Lipid Profile: No results for input(s): CHOL, HDL, LDLCALC, TRIG, CHOLHDL, LDLDIRECT in the last 72 hours. Thyroid Function Tests: No results for input(s): TSH, T4TOTAL, FREET4, T3FREE, THYROIDAB in the last 72 hours. Anemia Panel: No results for input(s): VITAMINB12, FOLATE, FERRITIN, TIBC, IRON, RETICCTPCT in the last 72 hours. Sepsis Labs: No results for input(s): PROCALCITON, LATICACIDVEN in the last 168 hours.  No results found for this or any previous visit (from the past 240 hour(s)).       Radiology Studies: No results found.      Scheduled Meds: . aspirin EC  81 mg Oral Daily  . carvedilol  3.125 mg Oral BID WC  . digoxin  0.125 mg Oral Daily  . eplerenone  25 mg Oral Daily  . furosemide  80 mg Intravenous BID  . hydrocerin   Topical Weekly  . insulin aspart  0-15 Units Subcutaneous TID WC  . insulin aspart  0-5 Units Subcutaneous QHS  . insulin glargine  30 Units Subcutaneous Daily  . polyethylene glycol  17 g Oral BID  . potassium chloride  40 mEq Oral BID  . QUEtiapine  800 mg Oral QHS  . rivaroxaban  20 mg Oral Q breakfast  . rosuvastatin  40 mg Oral q1800  . sacubitril-valsartan  1 tablet Oral BID  . sodium chloride flush  3 mL Intravenous Q12H   Continuous Infusions: . sodium chloride       LOS: 2 days    Time spent: 35 minutes.     Elmarie Shiley, MD [facility] Pager [phone]  If 7PM-7AM, please contact night-coverage [email or web address] Password [email or web address] 09/09/2017, 2:18 PM

## 2017-09-10 LAB — GLUCOSE, CAPILLARY
GLUCOSE-CAPILLARY: 215 mg/dL — AB (ref 70–99)
GLUCOSE-CAPILLARY: 516 mg/dL — AB (ref 70–99)
Glucose-Capillary: 333 mg/dL — ABNORMAL HIGH (ref 70–99)
Glucose-Capillary: 282 mg/dL — ABNORMAL HIGH (ref 70–99)

## 2017-09-10 LAB — BASIC METABOLIC PANEL
Anion gap: 11 (ref 5–15)
BUN: 21 mg/dL (ref 8–23)
CALCIUM: 10.9 mg/dL — AB (ref 8.9–10.3)
CO2: 30 mmol/L (ref 22–32)
CREATININE: 1.39 mg/dL — AB (ref 0.61–1.24)
Chloride: 95 mmol/L — ABNORMAL LOW (ref 98–111)
GFR calc non Af Amer: 52 mL/min — ABNORMAL LOW (ref >60)
Glucose, Bld: 203 mg/dL — ABNORMAL HIGH (ref 70–99)
Potassium: 4 mmol/L (ref 3.5–5.1)
SODIUM: 136 mmol/L (ref 135–145)

## 2017-09-10 LAB — URIC ACID: Uric Acid, Serum: 16.8 mg/dL — ABNORMAL HIGH (ref 3.7–8.6)

## 2017-09-10 MED ORDER — INSULIN ASPART 100 UNIT/ML ~~LOC~~ SOLN
3.0000 [IU] | Freq: Three times a day (TID) | SUBCUTANEOUS | Status: DC
  Administered 2017-09-11: 3 [IU] via SUBCUTANEOUS

## 2017-09-10 MED ORDER — PREDNISONE 20 MG PO TABS
40.0000 mg | ORAL_TABLET | Freq: Every day | ORAL | Status: DC
  Administered 2017-09-10 – 2017-09-11 (×2): 40 mg via ORAL
  Filled 2017-09-10 (×2): qty 2

## 2017-09-10 MED ORDER — FUROSEMIDE 10 MG/ML IJ SOLN
80.0000 mg | Freq: Once | INTRAMUSCULAR | Status: AC
  Administered 2017-09-10: 80 mg via INTRAVENOUS
  Filled 2017-09-10 (×2): qty 8

## 2017-09-10 MED ORDER — POTASSIUM CHLORIDE CRYS ER 20 MEQ PO TBCR
40.0000 meq | EXTENDED_RELEASE_TABLET | Freq: Every day | ORAL | Status: DC
  Administered 2017-09-10 – 2017-09-11 (×2): 40 meq via ORAL
  Filled 2017-09-10 (×2): qty 2

## 2017-09-10 MED ORDER — SACUBITRIL-VALSARTAN 49-51 MG PO TABS
1.0000 | ORAL_TABLET | Freq: Two times a day (BID) | ORAL | Status: DC
  Administered 2017-09-10 – 2017-09-11 (×3): 1 via ORAL
  Filled 2017-09-10 (×3): qty 1

## 2017-09-10 MED ORDER — INSULIN GLARGINE 100 UNIT/ML ~~LOC~~ SOLN
25.0000 [IU] | Freq: Two times a day (BID) | SUBCUTANEOUS | Status: DC
  Administered 2017-09-10 – 2017-09-11 (×2): 25 [IU] via SUBCUTANEOUS
  Filled 2017-09-10 (×2): qty 0.25

## 2017-09-10 MED ORDER — TORSEMIDE 20 MG PO TABS
80.0000 mg | ORAL_TABLET | Freq: Every day | ORAL | Status: DC
  Filled 2017-09-10: qty 4

## 2017-09-10 MED ORDER — TORSEMIDE 20 MG PO TABS
80.0000 mg | ORAL_TABLET | Freq: Every day | ORAL | Status: DC
  Administered 2017-09-11: 80 mg via ORAL
  Filled 2017-09-10: qty 4

## 2017-09-10 NOTE — Progress Notes (Signed)
Physical Therapy Treatment Patient Details Name: [name] [name] MRN: [id number] DOB: December 30, 1955 Today's Date: 09/10/2017    History of Present Illness [name] [name] is a 62 y.o. male with medical history significant of chronic systolic heart failure (EF 25-30% with diffuse hypokinesis in 5/19); HTN; afib on Xarelto; ETOH abuse in remission; cocaine abuse; Hep C; stage 2 CKD; probable OSA; DM; CVA; schizophrenia; and prostate CA presenting with SOB and LE edema.  RLE is infected and he has fluid in LLE.      PT Comments    Patient doing well with therapy today, progressing ambulation distance. Utilized RW for increased safety and independence, supervision level with AD. HRmax 118, SpO2 drops to low 80s on RA, increases back to over 90 within 30 seconds of rest. Pt DOE 2/4 with activity. HHPT recs still appropriate.      Follow Up Recommendations  Home health PT;Supervision - Intermittent     Equipment Recommendations  None recommended by PT    Recommendations for Other Services       Precautions / Restrictions Precautions Precautions: Fall Restrictions Weight Bearing Restrictions: No    Mobility  Bed Mobility Overal bed mobility: Independent                Transfers Overall transfer level: Modified independent Equipment used: None             General transfer comment: use of chair arms to rise   Ambulation/Gait Ambulation/Gait assistance: Min guard Gait Distance (Feet): 150 Feet Assistive device: Rolling walker (2 wheeled) Gait Pattern/deviations: Step-to pattern Gait velocity: decreased   General Gait Details: pt ambulating with RW this visit, increased distance. cues for proximity. SpO2 drops to mid 80's on RA with activity, returns quickly with rest. HRmax 120   Stairs             Wheelchair Mobility    Modified Rankin (Stroke Patients Only)       Balance Overall balance assessment: Needs assistance Sitting-balance support: No  upper extremity supported Sitting balance-Leahy Scale: Fair     Standing balance support: No upper extremity supported Standing balance-Leahy Scale: Fair                              Cognition Arousal/Alertness: Awake/alert Behavior During Therapy: Flat affect Overall Cognitive Status: Within Functional Limits for tasks assessed                                 General Comments: h/o schizophrenia      Exercises      General Comments        Pertinent Vitals/Pain Pain Assessment: No/denies pain Pain Location: R LE and back Pain Descriptors / Indicators: Throbbing Pain Intervention(s): Limited activity within patient's tolerance;Monitored during session;Premedicated before session    Home Living                      Prior Function            PT Goals (current goals can now be found in the care plan section) Acute Rehab PT Goals Patient Stated Goal: return home  PT Goal Formulation: With patient Time For Goal Achievement: 09/22/17 Potential to Achieve Goals: Good Progress towards PT goals: Progressing toward goals    Frequency    Min 2X/week      PT Plan Current plan  remains appropriate    Co-evaluation              AM-PAC PT "6 Clicks" Daily Activity  Outcome Measure  Difficulty turning over in bed (including adjusting bedclothes, sheets and blankets)?: A Little Difficulty moving from lying on back to sitting on the side of the bed? : A Little Difficulty sitting down on and standing up from a chair with arms (e.g., wheelchair, bedside commode, etc,.)?: A Little Help needed moving to and from a bed to chair (including a wheelchair)?: A Little Help needed walking in hospital room?: A Little Help needed climbing 3-5 steps with a railing? : A Lot 6 Click Score: 17    End of Session Equipment Utilized During Treatment: Gait belt Activity Tolerance: Patient tolerated treatment well;Patient limited by pain;Patient  limited by fatigue Patient left: in chair;with call bell/phone within reach;with nursing/sitter in room Nurse Communication: Mobility status;Weight bearing status PT Visit Diagnosis: Other abnormalities of gait and mobility (R26.89);Muscle weakness (generalized) (M62.81)     Time: 1310-1330 PT Time Calculation (min) (ACUTE ONLY): 20 min  Charges:  $Gait Training: 8-22 mins                    Reinaldo Berber, PT, DPT Acute Rehab Services Pager: [phone]    Reinaldo Berber 09/10/2017, 1:37 PM

## 2017-09-10 NOTE — Progress Notes (Signed)
Inpatient Diabetes Program Recommendations  AACE/ADA: New Consensus Statement on Inpatient Glycemic Control (2015)  Target Ranges:  Prepandial:   less than 140 mg/dL      Peak postprandial:   less than 180 mg/dL (1-2 hours)      Critically ill patients:  140 - 180 mg/dL   Lab Results  Component Value Date   GLUCAP 333 (H) 09/10/2017   HGBA1C 8.3 (H) 09/07/2017    Review of Glycemic ControlResults for COEN, MIYASATO (MRN [id number]) as of 09/10/2017 14:15  Ref. Range 09/09/2017 11:56 09/09/2017 16:14 09/09/2017 21:36 09/10/2017 07:44 09/10/2017 11:30  Glucose-Capillary Latest Ref Range: 70 - 99 mg/dL 241 (H) 211 (H) 215 (H) 215 (H) 333 (H)  Diabetes history: Type 2 DM  Outpatient Diabetes medications:  Lantus 30 units bid, Novolin R- 18 units tid with meals, Metformin 500 mg daily Current orders for Inpatient glycemic control:  Novolog moderate tid with meals and HS Lantus 25 units bid Inpatient Diabetes Program Recommendations:   Note increase in Lantus today.  Please consider adding Novolog meal coverage 6 units tid with meals (hold if patient eats less than 50%).   Thanks,  Adah Perl, RN, BC-ADM Inpatient Diabetes Coordinator Pager [phone] (8a-5p)

## 2017-09-10 NOTE — Progress Notes (Signed)
Pt recently walked with PT. His SaO2 sitting in chair now is 95 RA. Feels well. Discussed HF booklet. Pt voiced reception, listened.  [place] CES, ACSM 2:50 PM 09/10/2017

## 2017-09-10 NOTE — Progress Notes (Addendum)
Advanced Heart Failure Rounding Note  PCP-Cardiologist: No primary care provider on file.   Subjective:     Admitted with volume overload and RLE wound.   Yesterday diuresed with IV lasix + metolazone. Neg >5 liters. Weight down another 8 pounds. Overall weight down 25 pounds. Ambulated 3190 feet.   Complaining left knee pain. Denies SOB.   Feels like he is breathing better. No orthopnea or PND     Objective:   Weight Range: 129.4 kg Body mass index is 38.69 kg/m.   Vital Signs:   Temp:  [97.6 F (36.4 C)-98.5 F (36.9 C)] 97.6 F (36.4 C) (08/16 0439) Pulse Rate:  [47-84] 74 (08/16 0439) Resp:  [18] 18 (08/16 0439) BP: (104-127)/(65-98) 120/98 (08/16 0439) SpO2:  [91 %-98 %] 98 % (08/16 0439) Weight:  [129.4 kg] 129.4 kg (08/16 0439) Last BM Date: (~4 days ago per pt report)  Weight change: Filed Weights   09/08/17 0538 09/09/17 0444 09/10/17 0439  Weight: (!) 136.2 kg 133.2 kg 129.4 kg    Intake/Output:   Intake/Output Summary (Last 24 hours) at 09/10/2017 0724 Last data filed at 09/10/2017 0658 Gross per 24 hour  Intake 540 ml  Output 5825 ml  Net -5285 ml      Physical Exam   General:  No resp difficulty. Sitting in the chair.  HEENT: normal anicteric Neck: supple. JVP 5-6. Carotids 2+ bilat; no bruits. No lymphadenopathy or thryomegaly appreciated. Cor: PMI laterally displaced. Regular rate & rhythm. No rubs, gallops or murmurs. Lungs: clear no wheeze Abdomen: obese soft, nontender, nondistended. No hepatosplenomegaly. No bruits or masses. Good bowel sounds. Extremities: no cyanosis, clubbing, rash, R and LLE unna boots. Neuro: alert & oriented x 3, cranial nerves grossly intact. moves all 4 extremities w/o difficulty. Affect pleasant  Telemetry   NSR 70-80s personally reviewed.   EKG    N/A  Labs    CBC Recent Labs    09/07/17 0842 09/08/17 0022  WBC 5.4 4.8  NEUTROABS 3.0 2.5  HGB 13.1 13.0  HCT 42.3 41.9  MCV 95.5 94.6  PLT  162 696   Basic Metabolic Panel Recent Labs    09/09/17 0515 09/10/17 0530  NA 139 136  K 3.9 4.0  CL 99 95*  CO2 29 30  GLUCOSE 183* 203*  BUN 15 21  CREATININE 1.11 1.39*  CALCIUM 10.4* 10.9*   Liver Function Tests Recent Labs    09/07/17 0842 09/08/17 0022  AST 75* 54*  ALT 104* 91*  ALKPHOS 91 89  BILITOT 1.0 0.9  PROT 7.1 7.2  ALBUMIN 3.8 3.7   No results for input(s): LIPASE, AMYLASE in the last 72 hours. Cardiac Enzymes Recent Labs    09/07/17 1339  TROPONINI 0.12*    BNP: BNP (last 3 results) Recent Labs    05/13/17 0317 05/27/17 2140 09/07/17 0842  BNP 38.0 67.8 177.7*    ProBNP (last 3 results) No results for input(s): PROBNP in the last 8760 hours.   D-Dimer No results for input(s): DDIMER in the last 72 hours. Hemoglobin A1C Recent Labs    09/07/17 1543  HGBA1C 8.3*   Fasting Lipid Panel No results for input(s): CHOL, HDL, LDLCALC, TRIG, CHOLHDL, LDLDIRECT in the last 72 hours. Thyroid Function Tests No results for input(s): TSH, T4TOTAL, T3FREE, THYROIDAB in the last 72 hours.  Invalid input(s): FREET3  Other results:   Imaging    No results found.   Medications:     Scheduled Medications: .  aspirin EC  81 mg Oral Daily  . carvedilol  3.125 mg Oral BID WC  . digoxin  0.125 mg Oral Daily  . eplerenone  25 mg Oral Daily  . furosemide  80 mg Intravenous BID  . hydrocerin   Topical Weekly  . insulin aspart  0-15 Units Subcutaneous TID WC  . insulin aspart  0-5 Units Subcutaneous QHS  . insulin glargine  20 Units Subcutaneous BID  . polyethylene glycol  17 g Oral BID  . potassium chloride  40 mEq Oral BID  . QUEtiapine  800 mg Oral QHS  . rivaroxaban  20 mg Oral Q breakfast  . rosuvastatin  40 mg Oral q1800  . sacubitril-valsartan  1 tablet Oral BID  . sodium chloride flush  3 mL Intravenous Q12H    Infusions: . sodium chloride      PRN Medications: sodium chloride, acetaminophen, ondansetron (ZOFRAN) IV,  sodium chloride flush    Patient Profile  [name] [name] is a 62 y.o. 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 Had history of acute renal failure in setting of rhabdo requiring short term HD.   Presented to [facility] 09/07/17 with worsening edema and SOB. Course complicated by draining leg wounds.   Assessment/Plan  1) Acute on chronic combined systolic/diastolic HF: EF 93-79%, grade II DD (10/2012) now decreased to 20-25% (05/2015) RV severely reduced. Echo 4/19 EF 30-35% RV ok. Had [place] 2014 with minimal CAD. Holter showed <3% PVC burden. - Echo 05/31/17 EF 25-30%, RV normal - Echo 814/19 EF 25-30% - Volume status improved. Give one dose of 80 mg IV lasix.   -Start torsemide 80 mg daily. Prior to admit he was taking 60 mg torsemide daily.  - -  Continue eplerenone 25 mg daily (breast tenderness with spiro) - Continue carvedilol 3.125 mg BID, for now. Continue 0.125 mg digoxin.  -Increase entresto 49-51 mg twice a day.    Not a candidate for ICD with open wounds.  - Long history of non-compliance, but has repeatedly refused paramedicine.  2) HTN Stable.  3) PAF:  - Continue Xarelto 20 mg daily. -Maintaining NSR  4) ETOH abuse - Abstinent.No change.  5) CKD stage II - Creatinine trending up 1.1>1.3 6) ?OSA -Referred for sleep study previously. Has not followed up.  7) DMII - Would cover with SSI in-house.  8) Medication Noncompliance - He has been referred to HF Paramedicine program, but declined when called to set up a visit.No change.  9) Frequent PVCs while inpatient - Holter monitor 06/17/17: 3% PVCs.  -Maintaining NSR 10) RLE wound  -  Started on Vancomycin in ED. WOC consulted.  11. R knee pain  CHeck uric acid. Prednisone 40 mg daily for 3 days.    Medication  concerns reviewed with patient and pharmacy team. Barriers identified: none  Length of Stay: 3  Amy Clegg, NP  09/10/2017, 7:24 AM  Advanced Heart Failure Team Pager [phone] (M-F; 7a - 4p)  Please contact [facility] for night-coverage after hours (4p -7a ) and weekends on [email or web address]  Patient seen and examined with Darrick Grinder, NP. We discussed all aspects of the encounter. I agree with the assessment and plan as stated above.   He is much improved. Volume status nearing baseline. Will give one more dose of IV lasix and then switch to po diuretics.  Likely can go home on torsemide 60 mg in am and 40 mg in pm (pm dose is new) Right knee pain likely gout. Treat with prednisone 40 daily x 3 days. Will lf/u in HF Clinic.   Glori Bickers, MD  7:24 PM

## 2017-09-10 NOTE — Progress Notes (Addendum)
PROGRESS NOTE    [name] [name]  CXK:[id number] DOB: 1955-05-13 DOA: 09/07/2017 PCP: Rogers Blocker, MD  Brief Narrative: [name] [name] is a 62 y.o. male with medical history significant of chronic systolic heart failure (EF 25-30% with diffuse hypokinesis in 5/19); HTN; afib on Xarelto; ETOH abuse in remission; cocaine abuse; Hep C; stage 2 CKD; probable OSA; DM; CVA; schizophrenia; and prostate CA presenting with SOB and LE edema.  RLE is infected and he has fluid in LLE.  He gets tired when he ants.  +orthopnea.  Symptoms have been going on for about 10 days.  9-10 pound weight gain.  No chest pain.  +PND.   ED Course:   Acute CHF exacerbation - symptoms for 2 weeks.  Elevated LFTs - ?hepatic congestion.  CHF team consulted and will continue to consult.    Assessment & Plan:   Principal Problem:   Acute on chronic systolic heart failure (HCC) Active Problems:   HTN (hypertension)   Cocaine abuse (HCC)   Schizophrenia (HCC)   Paroxysmal atrial fibrillation (HCC)   Diabetes (HCC)   Elevated troponin   CKD (chronic kidney disease), stage III (HCC)   Stasis dermatitis of both legs   Acute on chronic systolic Heart failure exacerbation;  -Patient with known systolic heart failure presenting with worsening SOB and anasarca -CXR consistent with pulmonary edema -on lasix 80 mg IV BID. Change to torsemide.  Eplerenone, metolazone, entresto.  -10 L/    HTN;  On entresto.   Schizophrenia;  Continue with Seroquel.   A fib;  Continue with carvedilol.   DM;  lantus and SSI.  Increase lantus to 25 BID  CKD stage II;  Monitor renal function on lasix.   Cocaine use. Counseling provided.   Stasis dermatitis; wound care consulted.  Unna boot in place.   Right knee pain. Uric acid elevated. Started on prednisone.  He will need allopurinol.   Morbid, severe obesity;  BMI more than 40.  Diet education.   DVT prophylaxis: Xarelto.  Code Status: full code Family  Communication: care discussed with patient.  Disposition Plan: remain inpatient for treatment of HF   Consultants:   Cardiology    Procedures:   ECHO;    Antimicrobials  none   Subjective: Dyspnea improving.  Complaints of left knee pain.   Objective: Vitals:   09/10/17 0439 09/10/17 0900 09/10/17 1100 09/10/17 1247  BP: (!) 120/98 99/63 (!) 99/55 (!) 120/55  Pulse: 74 (!) 58 (!) 54 (!) 102  Resp: 18   18  Temp: 97.6 F (36.4 C)   98.3 F (36.8 C)  TempSrc: Oral   Oral  SpO2: 98%   95%  Weight: 129.4 kg     Height:        Intake/Output Summary (Last 24 hours) at 09/10/2017 1621 Last data filed at 09/10/2017 9702 Gross per 24 hour  Intake 300 ml  Output 2400 ml  Net -2100 ml   Filed Weights   09/08/17 0538 09/09/17 0444 09/10/17 0439  Weight: (!) 136.2 kg 133.2 kg 129.4 kg    Examination:  General exam: NAD Respiratory system: CTA Cardiovascular system: S 1, S 2 RRR Gastrointestinal system: BS present, soft, nt Central nervous system: non focal.  Extremities: B/L unna boot. Right knee tender to palpation.    Data Reviewed: I have personally reviewed following labs and imaging studies  CBC: Recent Labs  Lab 09/07/17 0842 09/08/17 0022  WBC 5.4 4.8  NEUTROABS 3.0 2.5  HGB 13.1 13.0  HCT 42.3 41.9  MCV 95.5 94.6  PLT 162 623   Basic Metabolic Panel: Recent Labs  Lab 09/07/17 0842 09/08/17 0022 09/09/17 0515 09/10/17 0530  NA 141 139 139 136  K 3.5 3.5 3.9 4.0  CL 102 101 99 95*  CO2 28 28 29 30   GLUCOSE 178* 190* 183* 203*  BUN 16 18 15 21   CREATININE 1.32* 1.31* 1.11 1.39*  CALCIUM 9.5 9.6 10.4* 10.9*   GFR: Estimated Creatinine Clearance: 75.6 mL/min (A) (by C-G formula based on SCr of 1.39 mg/dL (H)). Liver Function Tests: Recent Labs  Lab 09/07/17 0842 09/08/17 0022  AST 75* 54*  ALT 104* 91*  ALKPHOS 91 89  BILITOT 1.0 0.9  PROT 7.1 7.2  ALBUMIN 3.8 3.7   No results for input(s): LIPASE, AMYLASE in the last 168  hours. No results for input(s): AMMONIA in the last 168 hours. Coagulation Profile: No results for input(s): INR, PROTIME in the last 168 hours. Cardiac Enzymes: Recent Labs  Lab 09/07/17 1339  TROPONINI 0.12*   BNP (last 3 results) No results for input(s): PROBNP in the last 8760 hours. HbA1C: No results for input(s): HGBA1C in the last 72 hours. CBG: Recent Labs  Lab 09/09/17 1156 09/09/17 1614 09/09/17 2136 09/10/17 0744 09/10/17 1130  GLUCAP 241* 211* 215* 215* 333*   Lipid Profile: No results for input(s): CHOL, HDL, LDLCALC, TRIG, CHOLHDL, LDLDIRECT in the last 72 hours. Thyroid Function Tests: No results for input(s): TSH, T4TOTAL, FREET4, T3FREE, THYROIDAB in the last 72 hours. Anemia Panel: No results for input(s): VITAMINB12, FOLATE, FERRITIN, TIBC, IRON, RETICCTPCT in the last 72 hours. Sepsis Labs: No results for input(s): PROCALCITON, LATICACIDVEN in the last 168 hours.  No results found for this or any previous visit (from the past 240 hour(s)).       Radiology Studies: No results found.      Scheduled Meds: . aspirin EC  81 mg Oral Daily  . carvedilol  3.125 mg Oral BID WC  . digoxin  0.125 mg Oral Daily  . eplerenone  25 mg Oral Daily  . hydrocerin   Topical Weekly  . insulin aspart  0-15 Units Subcutaneous TID WC  . insulin aspart  0-5 Units Subcutaneous QHS  . insulin glargine  25 Units Subcutaneous BID  . polyethylene glycol  17 g Oral BID  . potassium chloride  40 mEq Oral Daily  . predniSONE  40 mg Oral Q breakfast  . QUEtiapine  800 mg Oral QHS  . rivaroxaban  20 mg Oral Q breakfast  . rosuvastatin  40 mg Oral q1800  . sacubitril-valsartan  1 tablet Oral BID  . sodium chloride flush  3 mL Intravenous Q12H  . [START ON 09/11/2017] torsemide  80 mg Oral Daily   Continuous Infusions: . sodium chloride       LOS: 3 days    Time spent: 35 minutes.     Elmarie Shiley, MD [facility] Pager [phone]  If  7PM-7AM, please contact night-coverage [email or web address] Password [email or web address] 09/10/2017, 4:21 PM

## 2017-09-11 LAB — BASIC METABOLIC PANEL
ANION GAP: 11 (ref 5–15)
Anion gap: 12 (ref 5–15)
BUN: 33 mg/dL — ABNORMAL HIGH (ref 8–23)
BUN: 38 mg/dL — AB (ref 8–23)
CALCIUM: 10.9 mg/dL — AB (ref 8.9–10.3)
CO2: 29 mmol/L (ref 22–32)
CO2: 26 mmol/L (ref 22–32)
CREATININE: 1.53 mg/dL — AB (ref 0.61–1.24)
Calcium: 10.5 mg/dL — ABNORMAL HIGH (ref 8.9–10.3)
Chloride: 95 mmol/L — ABNORMAL LOW (ref 98–111)
Chloride: 90 mmol/L — ABNORMAL LOW (ref 98–111)
Creatinine, Ser: 1.71 mg/dL — ABNORMAL HIGH (ref 0.61–1.24)
GFR calc non Af Amer: 41 mL/min — ABNORMAL LOW (ref >60)
GFR, EST AFRICAN AMERICAN: 54 mL/min — AB (ref >60)
GFR, EST AFRICAN AMERICAN: 47 mL/min — AB (ref >60)
GFR, EST NON AFRICAN AMERICAN: 47 mL/min — AB (ref >60)
GLUCOSE: 229 mg/dL — AB (ref 70–99)
Glucose, Bld: 434 mg/dL — ABNORMAL HIGH (ref 70–99)
POTASSIUM: 4.9 mmol/L (ref 3.5–5.1)
Potassium: 4.5 mmol/L (ref 3.5–5.1)
SODIUM: 128 mmol/L — AB (ref 135–145)
Sodium: 135 mmol/L (ref 135–145)

## 2017-09-11 LAB — GLUCOSE, CAPILLARY
GLUCOSE-CAPILLARY: 424 mg/dL — AB (ref 70–99)
GLUCOSE-CAPILLARY: 432 mg/dL — AB (ref 70–99)
Glucose-Capillary: 212 mg/dL — ABNORMAL HIGH (ref 70–99)
Glucose-Capillary: 517 mg/dL (ref 70–99)
Glucose-Capillary: 497 mg/dL — ABNORMAL HIGH (ref 70–99)
Glucose-Capillary: 523 mg/dL (ref 70–99)
Glucose-Capillary: 367 mg/dL — ABNORMAL HIGH (ref 70–99)

## 2017-09-11 MED ORDER — PREDNISONE 20 MG PO TABS
20.0000 mg | ORAL_TABLET | Freq: Every day | ORAL | Status: AC
  Administered 2017-09-12: 20 mg via ORAL
  Filled 2017-09-11: qty 1

## 2017-09-11 MED ORDER — SACUBITRIL-VALSARTAN 49-51 MG PO TABS
1.0000 | ORAL_TABLET | Freq: Two times a day (BID) | ORAL | Status: DC
  Filled 2017-09-11: qty 1

## 2017-09-11 MED ORDER — ALLOPURINOL 100 MG PO TABS
100.0000 mg | ORAL_TABLET | Freq: Every day | ORAL | Status: DC
  Administered 2017-09-11 – 2017-09-13 (×3): 100 mg via ORAL
  Filled 2017-09-11 (×3): qty 1

## 2017-09-11 MED ORDER — INSULIN ASPART 100 UNIT/ML ~~LOC~~ SOLN
5.0000 [IU] | Freq: Three times a day (TID) | SUBCUTANEOUS | Status: DC
  Administered 2017-09-11 (×2): 5 [IU] via SUBCUTANEOUS

## 2017-09-11 MED ORDER — INSULIN GLARGINE 100 UNIT/ML ~~LOC~~ SOLN
30.0000 [IU] | Freq: Two times a day (BID) | SUBCUTANEOUS | Status: DC
  Administered 2017-09-11 – 2017-09-13 (×4): 30 [IU] via SUBCUTANEOUS
  Filled 2017-09-11 (×5): qty 0.3

## 2017-09-11 MED ORDER — SODIUM CHLORIDE 0.9 % IV BOLUS
500.0000 mL | Freq: Once | INTRAVENOUS | Status: AC
  Administered 2017-09-11: 500 mL via INTRAVENOUS

## 2017-09-11 MED ORDER — INSULIN ASPART 100 UNIT/ML ~~LOC~~ SOLN
5.0000 [IU] | Freq: Once | SUBCUTANEOUS | Status: AC
  Administered 2017-09-11: 5 [IU] via SUBCUTANEOUS

## 2017-09-11 NOTE — Progress Notes (Signed)
Pt's care started from 3pm, BP is 98/49  Palma Holter, Therapist, sports

## 2017-09-11 NOTE — Progress Notes (Addendum)
Advanced Heart Failure Rounding Note  PCP-Cardiologist: No primary care provider on file.   Subjective:     Admitted with volume overload and RLE wound.   Diuresed with IV lasix + metolazone. Weight down 26 pounds total. Switched to po torsemide yesterday. Feels good. R knee pain improved with steroids. Uric acid 16  SBP in 80-90s. Denies dizziness or SOB. Diuretics and Entresto on hold. Creatinine 1.39-> 1.53. Glucose 400-500.     Objective:   Weight Range: 128.6 kg Body mass index is 38.45 kg/m.   Vital Signs:   Temp:  [97.4 F (36.3 C)-98.4 F (36.9 C)] 97.5 F (36.4 C) (08/17 1155) Pulse Rate:  [44-102] 48 (08/17 1155) Resp:  [18-19] 18 (08/17 1155) BP: (81-128)/(55-93) 81/63 (08/17 1155) SpO2:  [94 %-96 %] 96 % (08/17 1155) Weight:  [128.6 kg] 128.6 kg (08/17 0539) Last BM Date: 09/10/17  Weight change: Filed Weights   09/09/17 0444 09/10/17 0439 09/11/17 0539  Weight: 133.2 kg 129.4 kg 128.6 kg    Intake/Output:   Intake/Output Summary (Last 24 hours) at 09/11/2017 1227 Last data filed at 09/11/2017 1213 Gross per 24 hour  Intake 1080 ml  Output 1730 ml  Net -650 ml      Physical Exam   General:  Sitting in chair. No resp difficulty HEENT: normal Neck: supple. no JVD. Carotids 2+ bilat; no bruits. No lymphadenopathy or thryomegaly appreciated. Cor: PMI nondisplaced. Regular rate & rhythm. No rubs, gallops or murmurs. Lungs: clear Abdomen: obese soft, nontender, nondistended. No hepatosplenomegaly. No bruits or masses. Good bowel sounds. Extremities: no cyanosis, clubbing, rash, edema UNNA boots no edema  R knee less sore Neuro: alert & orientedx3, cranial nerves grossly intact. moves all 4 extremities w/o difficulty. Affect pleasant   Telemetry   NSR 70s personally reviewed.   EKG    N/A  Labs    CBC No results for input(s): WBC, NEUTROABS, HGB, HCT, MCV, PLT in the last 72 hours. Basic Metabolic Panel Recent Labs    09/10/17 0530  09/11/17 0609  NA 136 135  K 4.0 4.5  CL 95* 95*  CO2 30 29  GLUCOSE 203* 229*  BUN 21 33*  CREATININE 1.39* 1.53*  CALCIUM 10.9* 10.9*   Liver Function Tests No results for input(s): AST, ALT, ALKPHOS, BILITOT, PROT, ALBUMIN in the last 72 hours. No results for input(s): LIPASE, AMYLASE in the last 72 hours. Cardiac Enzymes No results for input(s): CKTOTAL, CKMB, CKMBINDEX, TROPONINI in the last 72 hours.  BNP: BNP (last 3 results) Recent Labs    05/13/17 0317 05/27/17 2140 09/07/17 0842  BNP 38.0 67.8 177.7*    ProBNP (last 3 results) No results for input(s): PROBNP in the last 8760 hours.   D-Dimer No results for input(s): DDIMER in the last 72 hours. Hemoglobin A1C No results for input(s): HGBA1C in the last 72 hours. Fasting Lipid Panel No results for input(s): CHOL, HDL, LDLCALC, TRIG, CHOLHDL, LDLDIRECT in the last 72 hours. Thyroid Function Tests No results for input(s): TSH, T4TOTAL, T3FREE, THYROIDAB in the last 72 hours.  Invalid input(s): FREET3  Other results:   Imaging    No results found.   Medications:     Scheduled Medications: . allopurinol  100 mg Oral Daily  . aspirin EC  81 mg Oral Daily  . carvedilol  3.125 mg Oral BID WC  . digoxin  0.125 mg Oral Daily  . eplerenone  25 mg Oral Daily  . hydrocerin   Topical  Weekly  . insulin aspart  0-15 Units Subcutaneous TID WC  . insulin aspart  0-5 Units Subcutaneous QHS  . insulin aspart  5 Units Subcutaneous TID WC  . insulin glargine  25 Units Subcutaneous BID  . polyethylene glycol  17 g Oral BID  . potassium chloride  40 mEq Oral Daily  . predniSONE  40 mg Oral Q breakfast  . QUEtiapine  800 mg Oral QHS  . rivaroxaban  20 mg Oral Q breakfast  . rosuvastatin  40 mg Oral q1800  . [START ON 09/12/2017] sacubitril-valsartan  1 tablet Oral BID  . sodium chloride flush  3 mL Intravenous Q12H    Infusions: . sodium chloride      PRN Medications: sodium chloride, acetaminophen,  ondansetron (ZOFRAN) IV, sodium chloride flush    Patient Profile  [name] [name] is a 62 y.o. 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 Had history of acute renal failure in setting of rhabdo requiring short term HD.   Presented to [facility]/07/17 with worsening edema and SOB. Course complicated by draining leg wounds.   Assessment/Plan  1) Acute on chronic combined systolic/diastolic HF: EF 25-95%, grade II DD (10/2012) now decreased to 20-25% (05/2015) RV severely reduced. Echo 4/19 EF 30-35% RV ok. Had [place] 2014 with minimal CAD. Holter showed <3% PVC burden. - Echo 05/31/17 EF 25-30%, RV normal - Echo 814/19 EF 25-30% - Volume status much improved. Now down 26 pounds. Overdiuresed - Hold all diuretics for 2 days. Given high blood sugars (osmotic diuresis) and low BP will give 500 cc NS> liberalize fluids - Restart torsemide 80 mg daily possibly on Monday. Prior to admit he was taking 60 mg torsemide daily.  -  Continue eplerenone 25 mg daily (breast tenderness with spiro) - Continue carvedilol 3.125 mg BID, for now. Continue 0.125 mg digoxin.  - Hold Entesto. Resume entresto 49-51 mg twice a day when BP improved  Not a candidate for ICD with open wounds.  - Long history of non-compliance, but has repeatedly refused paramedicine.  2) HTN - BP low due to overdiuresis. Giving fluids as above. Hold diuretics 3) PAF:  - Continue Xarelto 20 mg daily. -Maintaining NSR  4) ETOH abuse - Abstinent.No change.  5) Acute on CKD stage II - Creatinine trending up 1.1>1.3> 1.5 holding diuretics. Encouraged to take po.  6) ?OSA -Referred for sleep study previously. Has not followed up.  7) DMII, poorly uncontrolled - Per primary 8) Medication Noncompliance - He has been referred to HF  Paramedicine program, but declined when called to set up a visit.No change.  9) Frequent PVCs while inpatient - Holter monitor 06/17/17: 3% PVCs.  -Maintaining NSR 10) RLE wound  -  Started on Vancomycin in ED. WOC consulted.  11)  R knee pain = acute gout -Uric acid 16. Pain improved with Prednisone 40 mg daily for 3 days. Today day 2/3  - Increase allopurinol to 300 daily on d/c    Length of Stay: Kress, MD  09/11/2017, 12:27 PM  Advanced Heart Failure Team Pager [phone] (M-F; 7a - 4p)  Please contact [facility] for night-coverage

## 2017-09-11 NOTE — Progress Notes (Signed)
CRITICAL VALUE ALERT  Critical Value:  CBG= 217  Date & Time Notied:  09/11/17 1157  Provider Notified: yes  Orders Received/Actions taken: verbal order to give 15 units sliding scale at this time and 5 units meal coverage

## 2017-09-11 NOTE — Progress Notes (Signed)
Meal coverage novolog 5 units not verified by pharmacy. RN notified pharmacy. RN will give once verified.

## 2017-09-11 NOTE — Progress Notes (Signed)
CRITICAL VALUE ALERT  Critical Value:  CBG 497  Date & Time Notied:  8/17/ 4.30p  Provider Notified: Attending MD  Orders Received/Actions taken: after giving 20 units Novolog and checking after 15 mins,  it was 523, MD said to wait 2-3 hours to recheck. MD adjusted pt's insulin  Another 5 units of insulin given after 523 per MD order  Will continue to monitor the patient  Palma Holter, RN

## 2017-09-11 NOTE — Progress Notes (Signed)
MD states to encourage pt to drink and fluids and for pt not to ambulate long distances until BP is controlled.

## 2017-09-11 NOTE — Progress Notes (Deleted)
MD verbal order to give novolog 5 units plus sliding scale (15 units) at this time.

## 2017-09-11 NOTE — Progress Notes (Signed)
Pt's BG is 516. Jeannette Corpus NP notified, awaiting call back.

## 2017-09-11 NOTE — Progress Notes (Signed)
CARDIAC REHAB PHASE I   PRE:  Rate/Rhythm: Sinus Rhythm PVc's  BP:    Sitting: 92/60  Standing: 84/61   SaO2: 96%    [phone]  Did not ambulate due to low BP. Patient asymptomatic. RN notified. Will follow up with patient later. Harrell Gave RN BSN

## 2017-09-11 NOTE — Progress Notes (Signed)
PROGRESS NOTE    [name] [name]  ZOX:[id number] DOB: Oct 31, 1955 DOA: 09/07/2017 PCP: Rogers Blocker, MD  Brief Narrative: [name] [name] is a 62 y.o. male with medical history significant of chronic systolic heart failure (EF 25-30% with diffuse hypokinesis in 5/19); HTN; afib on Xarelto; ETOH abuse in remission; cocaine abuse; Hep C; stage 2 CKD; probable OSA; DM; CVA; schizophrenia; and prostate CA presenting with SOB and LE edema.  RLE is infected and he has fluid in LLE.  He gets tired when he ants.  +orthopnea.  Symptoms have been going on for about 10 days.  9-10 pound weight gain.  No chest pain.  +PND.   ED Course:   Acute CHF exacerbation - symptoms for 2 weeks.  Elevated LFTs - ?hepatic congestion.  CHF team consulted and will continue to consult.    Assessment & Plan:   Principal Problem:   Acute on chronic systolic heart failure (HCC) Active Problems:   HTN (hypertension)   Cocaine abuse (HCC)   Schizophrenia (HCC)   Paroxysmal atrial fibrillation (HCC)   Diabetes (HCC)   Elevated troponin   CKD (chronic kidney disease), stage III (HCC)   Stasis dermatitis of both legs   Acute on chronic systolic Heart failure exacerbation;  -Patient with known systolic heart failure presenting with worsening SOB and anasarca -CXR consistent with pulmonary edema -on lasix 80 mg IV BID. Change to torsemide.  Eplerenone, metolazone, entresto.  Negative 10 L.  Cr increase, orthostatic vitals/  Will hold entresto tonight, received torsemide, hold for tomorrow.    HTN;  Hold  entresto.   Schizophrenia;  Continue with Seroquel.   A fib;  Continue with carvedilol.   DM;  lantus and SSI.  Increase lantus to 25 BID Worsening hyperglycemia in setting of prednisone.  Increase meals coverage, increase lantus to 30 units. Received 20 units novolog for CBG 500.   CKD stage II;  Hold torsemide for tomorrow.   Cocaine use. Counseling provided.   Stasis dermatitis; wound care  consulted.  Unna boot in place.   Right knee pain. Uric acid elevated. Started on prednisone.  Start allopurinol.   Morbid, severe obesity;  BMI more than 40.  Diet education.   DVT prophylaxis: Xarelto.  Code Status: full code Family Communication: care discussed with patient.  Disposition Plan: remain inpatient for treatment of HF   Consultants:   Cardiology    Procedures:   ECHO;    Antimicrobials  none   Subjective: He is sleepy, went to sleep late last night. He denies dyspnea. Knee pain improved.   Objective: Vitals:   09/11/17 0854 09/11/17 0938 09/11/17 0939 09/11/17 1155  BP:  92/62 (!) 84/61 (!) 81/63  Pulse: 62   (!) 48  Resp:    18  Temp:    (!) 97.5 F (36.4 C)  TempSrc:    Oral  SpO2:    96%  Weight:      Height:        Intake/Output Summary (Last 24 hours) at 09/11/2017 1253 Last data filed at 09/11/2017 1249 Gross per 24 hour  Intake 1080 ml  Output 2010 ml  Net -930 ml   Filed Weights   09/09/17 0444 09/10/17 0439 09/11/17 0539  Weight: 133.2 kg 129.4 kg 128.6 kg    Examination:  General exam: NAD Respiratory system: CTA Cardiovascular system: S 1, S 2 RRR Gastrointestinal system: BS present, soft, nt Central nervous system: Non focal.  Extremities: B/L unna boot. Right  knee tender to palpation.    Data Reviewed: I have personally reviewed following labs and imaging studies  CBC: Recent Labs  Lab 09/07/17 0842 09/08/17 0022  WBC 5.4 4.8  NEUTROABS 3.0 2.5  HGB 13.1 13.0  HCT 42.3 41.9  MCV 95.5 94.6  PLT 162 702   Basic Metabolic Panel: Recent Labs  Lab 09/07/17 0842 09/08/17 0022 09/09/17 0515 09/10/17 0530 09/11/17 0609  NA 141 139 139 136 135  K 3.5 3.5 3.9 4.0 4.5  CL 102 101 99 95* 95*  CO2 28 28 29 30 29   GLUCOSE 178* 190* 183* 203* 229*  BUN 16 18 15 21  33*  CREATININE 1.32* 1.31* 1.11 1.39* 1.53*  CALCIUM 9.5 9.6 10.4* 10.9* 10.9*   GFR: Estimated Creatinine Clearance: 68.5 mL/min (A) (by C-G  formula based on SCr of 1.53 mg/dL (H)). Liver Function Tests: Recent Labs  Lab 09/07/17 0842 09/08/17 0022  AST 75* 54*  ALT 104* 91*  ALKPHOS 91 89  BILITOT 1.0 0.9  PROT 7.1 7.2  ALBUMIN 3.8 3.7   No results for input(s): LIPASE, AMYLASE in the last 168 hours. No results for input(s): AMMONIA in the last 168 hours. Coagulation Profile: No results for input(s): INR, PROTIME in the last 168 hours. Cardiac Enzymes: Recent Labs  Lab 09/07/17 1339  TROPONINI 0.12*   BNP (last 3 results) No results for input(s): PROBNP in the last 8760 hours. HbA1C: No results for input(s): HGBA1C in the last 72 hours. CBG: Recent Labs  Lab 09/10/17 1621 09/10/17 2114 09/11/17 0747 09/11/17 1154 09/11/17 1237  GLUCAP 282* 516* 212* 517* 424*   Lipid Profile: No results for input(s): CHOL, HDL, LDLCALC, TRIG, CHOLHDL, LDLDIRECT in the last 72 hours. Thyroid Function Tests: No results for input(s): TSH, T4TOTAL, FREET4, T3FREE, THYROIDAB in the last 72 hours. Anemia Panel: No results for input(s): VITAMINB12, FOLATE, FERRITIN, TIBC, IRON, RETICCTPCT in the last 72 hours. Sepsis Labs: No results for input(s): PROCALCITON, LATICACIDVEN in the last 168 hours.  No results found for this or any previous visit (from the past 240 hour(s)).       Radiology Studies: No results found.      Scheduled Meds: . allopurinol  100 mg Oral Daily  . aspirin EC  81 mg Oral Daily  . carvedilol  3.125 mg Oral BID WC  . digoxin  0.125 mg Oral Daily  . eplerenone  25 mg Oral Daily  . hydrocerin   Topical Weekly  . insulin aspart  0-15 Units Subcutaneous TID WC  . insulin aspart  0-5 Units Subcutaneous QHS  . insulin aspart  5 Units Subcutaneous TID WC  . insulin glargine  25 Units Subcutaneous BID  . polyethylene glycol  17 g Oral BID  . predniSONE  40 mg Oral Q breakfast  . QUEtiapine  800 mg Oral QHS  . rivaroxaban  20 mg Oral Q breakfast  . rosuvastatin  40 mg Oral q1800  . [START ON  09/12/2017] sacubitril-valsartan  1 tablet Oral BID  . sodium chloride flush  3 mL Intravenous Q12H   Continuous Infusions: . sodium chloride    . sodium chloride       LOS: 4 days    Time spent: 35 minutes.     Elmarie Shiley, MD [facility] Pager [phone]  If 7PM-7AM, please contact night-coverage [email or web address] Password [email or web address] 09/11/2017, 12:53 PM

## 2017-09-11 NOTE — Progress Notes (Signed)
Paged MD regarding Na 128, Waiting for the response  Palma Holter, RN

## 2017-09-11 NOTE — Progress Notes (Signed)
Pt's BP sitting= 92/62 and standing= 84/61. Pt is asymptomatic. MD notified. No new orders.

## 2017-09-12 LAB — BASIC METABOLIC PANEL
ANION GAP: 8 (ref 5–15)
ANION GAP: 11 (ref 5–15)
BUN: 39 mg/dL — ABNORMAL HIGH (ref 8–23)
BUN: 40 mg/dL — ABNORMAL HIGH (ref 8–23)
CALCIUM: 10.6 mg/dL — AB (ref 8.9–10.3)
CALCIUM: 10.8 mg/dL — AB (ref 8.9–10.3)
CO2: 31 mmol/L (ref 22–32)
CO2: 27 mmol/L (ref 22–32)
CREATININE: 1.62 mg/dL — AB (ref 0.61–1.24)
CREATININE: 1.08 mg/dL (ref 0.61–1.24)
Chloride: 95 mmol/L — ABNORMAL LOW (ref 98–111)
Chloride: 97 mmol/L — ABNORMAL LOW (ref 98–111)
GFR, EST AFRICAN AMERICAN: 51 mL/min — AB (ref >60)
GFR, EST NON AFRICAN AMERICAN: 44 mL/min — AB (ref >60)
Glucose, Bld: 258 mg/dL — ABNORMAL HIGH (ref 70–99)
Glucose, Bld: 183 mg/dL — ABNORMAL HIGH (ref 70–99)
Potassium: 5 mmol/L (ref 3.5–5.1)
Potassium: 4.5 mmol/L (ref 3.5–5.1)
SODIUM: 136 mmol/L (ref 135–145)
SODIUM: 133 mmol/L — AB (ref 135–145)

## 2017-09-12 LAB — GLUCOSE, CAPILLARY
GLUCOSE-CAPILLARY: 268 mg/dL — AB (ref 70–99)
GLUCOSE-CAPILLARY: 268 mg/dL — AB (ref 70–99)
GLUCOSE-CAPILLARY: 277 mg/dL — AB (ref 70–99)
Glucose-Capillary: 155 mg/dL — ABNORMAL HIGH (ref 70–99)

## 2017-09-12 LAB — MAGNESIUM: Magnesium: 2.4 mg/dL (ref 1.7–2.4)

## 2017-09-12 MED ORDER — INSULIN ASPART 100 UNIT/ML ~~LOC~~ SOLN
8.0000 [IU] | Freq: Three times a day (TID) | SUBCUTANEOUS | Status: DC
  Administered 2017-09-12 – 2017-09-13 (×5): 8 [IU] via SUBCUTANEOUS

## 2017-09-12 NOTE — Discharge Instructions (Signed)
Rivaroxaban oral tablets What is this medicine? RIVAROXABAN (ri va ROX a ban) is an anticoagulant (blood thinner). It is used to treat blood clots in the lungs or in the veins. It is also used after knee or hip surgeries to prevent blood clots. It is also used to lower the chance of stroke in people with a medical condition called atrial fibrillation. This medicine may be used for other purposes; ask your health care provider or pharmacist if you have questions. COMMON BRAND NAME(S): Xarelto, Xarelto Starter Pack What should I tell my health care provider before I take this medicine? They need to know if you have any of these conditions: -bleeding disorders -bleeding in the brain -blood in your stools (black or tarry stools) or if you have blood in your vomit -history of stomach bleeding -kidney disease -liver disease -low blood counts, like low white cell, platelet, or red cell counts -recent or planned spinal or epidural procedure -take medicines that treat or prevent blood clots -an unusual or allergic reaction to rivaroxaban, other medicines, foods, dyes, or preservatives -pregnant or trying to get pregnant -breast-feeding How should I use this medicine? Take this medicine by mouth with a glass of water. Follow the directions on the prescription label. Take your medicine at regular intervals. Do not take it more often than directed. Do not stop taking except on your doctor's advice. Stopping this medicine may increase your risk of a blood clot. Be sure to refill your prescription before you run out of medicine. If you are taking this medicine after hip or knee replacement surgery, take it with or without food. If you are taking this medicine for atrial fibrillation, take it with your evening meal. If you are taking this medicine to treat blood clots, take it with food at the same time each day. If you are unable to swallow your tablet, you may crush the tablet and mix it in applesauce. Then,  immediately eat the applesauce. You should eat more food right after you eat the applesauce containing the crushed tablet. Talk to your pediatrician regarding the use of this medicine in children. Special care may be needed. Overdosage: If you think you have taken too much of this medicine contact a poison control center or emergency room at once. NOTE: This medicine is only for you. Do not share this medicine with others. What if I miss a dose? If you take your medicine once a day and miss a dose, take the missed dose as soon as you remember. If you take your medicine twice a day and miss a dose, take the missed dose immediately. In this instance, 2 tablets may be taken at the same time. The next day you should take 1 tablet twice a day as directed. What may interact with this medicine? Do not take this medicine with any of the following medications: -defibrotide This medicine may also interact with the following medications: -aspirin and aspirin-like medicines -certain antibiotics like erythromycin, azithromycin, and clarithromycin -certain medicines for fungal infections like ketoconazole and itraconazole -certain medicines for irregular heart beat like amiodarone, quinidine, dronedarone -certain medicines for seizures like carbamazepine, phenytoin -certain medicines that treat or prevent blood clots like warfarin, enoxaparin, and dalteparin -conivaptan -diltiazem -felodipine -indinavir -lopinavir; ritonavir -NSAIDS, medicines for pain and inflammation, like ibuprofen or naproxen -ranolazine -rifampin -ritonavir -SNRIs, medicines for depression, like desvenlafaxine, duloxetine, levomilnacipran, venlafaxine -SSRIs, medicines for depression, like citalopram, escitalopram, fluoxetine, fluvoxamine, paroxetine, sertraline -St. John's wort -verapamil This list may not describe all  possible interactions. Give your health care provider a list of all the medicines, herbs, non-prescription  drugs, or dietary supplements you use. Also tell them if you smoke, drink alcohol, or use illegal drugs. Some items may interact with your medicine. What should I watch for while using this medicine? Visit your doctor or health care professional for regular checks on your progress. Notify your doctor or health care professional and seek emergency treatment if you develop breathing problems; changes in vision; chest pain; severe, sudden headache; pain, swelling, warmth in the leg; trouble speaking; sudden numbness or weakness of the face, arm or leg. These can be signs that your condition has gotten worse. If you are going to have surgery or other procedure, tell your doctor that you are taking this medicine. What side effects may I notice from receiving this medicine? Side effects that you should report to your doctor or health care professional as soon as possible: -allergic reactions like skin rash, itching or hives, swelling of the face, lips, or tongue -back pain -redness, blistering, peeling or loosening of the skin, including inside the mouth -signs and symptoms of bleeding such as bloody or black, tarry stools; red or dark-brown urine; spitting up blood or brown material that looks like coffee grounds; red spots on the skin; unusual bruising or bleeding from the eye, gums, or nose Side effects that usually do not require medical attention (report to your doctor or health care professional if they continue or are bothersome): -dizziness -muscle pain This list may not describe all possible side effects. Call your doctor for medical advice about side effects. You may report side effects to FDA at 1-800-FDA-1088. Where should I keep my medicine? Keep out of the reach of children. Store at room temperature between 15 and 30 degrees C (59 and 86 degrees F). Throw away any unused medicine after the expiration date. NOTE: This sheet is a summary. It may not cover all possible information. If you  have questions about this medicine, talk to your doctor, pharmacist, or health care provider.  2018 Elsevier/Gold Standard (2015-10-02 16:29:33)

## 2017-09-12 NOTE — Plan of Care (Signed)
  Problem: Education: Goal: Ability to demonstrate management of disease process will improve Outcome: Adequate for Discharge   Problem: Education: Goal: Ability to verbalize understanding of medication therapies will improve Outcome: Adequate for Discharge   

## 2017-09-12 NOTE — Progress Notes (Addendum)
PROGRESS NOTE    [name] [name]  YQI:[id number] DOB: 1955-07-25 DOA: 09/07/2017 PCP: Rogers Blocker, MD  Brief Narrative: [name] [name] is a 62 y.o. male with medical history significant of chronic systolic heart failure (EF 25-30% with diffuse hypokinesis in 5/19); HTN; afib on Xarelto; ETOH abuse in remission; cocaine abuse; Hep C; stage 2 CKD; probable OSA; DM; CVA; schizophrenia; and prostate CA presenting with SOB and LE edema.  RLE is infected and he has fluid in LLE.  He gets tired when he ants.  +orthopnea.  Symptoms have been going on for about 10 days.  9-10 pound weight gain.  No chest pain.  +PND.   ED Course:   Acute CHF exacerbation - symptoms for 2 weeks.  Elevated LFTs - ?hepatic congestion.  CHF team consulted and will continue to consult.    Assessment & Plan:   Principal Problem:   Acute on chronic systolic heart failure (HCC) Active Problems:   HTN (hypertension)   Cocaine abuse (HCC)   Schizophrenia (HCC)   Paroxysmal atrial fibrillation (HCC)   Diabetes (HCC)   Elevated troponin   CKD (chronic kidney disease), stage III (HCC)   Stasis dermatitis of both legs   Acute on chronic systolic Heart failure exacerbation;  -Patient with known systolic heart failure presenting with worsening SOB and anasarca -CXR consistent with pulmonary edema -on lasix 80 mg IV BID. Change to torsemide.  Eplerenone, metolazone, entresto.  Negative 11 L.  Cr increase, orthostatic vitals/  BP 100 range, hold entresto, torsemide. Cr at 1.6.   HTN;  Hold  entresto.   Schizophrenia;  Continue with Seroquel.   A fib;  Continue with carvedilol. Holding today HR in the 40  Bradycardia, PVC; check mg level.   DM;  lantus and SSI.  Increase lantus to 30 units  BID Meals coverage 8 units   CKD stage II;  Cr at 1.6  Torsemide on hold.    Cocaine use. Counseling provided.   Stasis dermatitis; wound care consulted.  Unna boot in place.   Right knee pain. Uric acid  elevated. Started on prednisone. Last dose today  continue with  allopurinol.   Morbid, severe obesity;  BMI more than 40.  Diet education.   DVT prophylaxis: Xarelto.  Code Status: full code Family Communication: care discussed with patient.  Disposition Plan: remain inpatient for treatment of HF   Consultants:   Cardiology    Procedures:   ECHO;    Antimicrobials  none   Subjective: Report right knee pain is better. Denies dyspnea.   Objective: Vitals:   09/12/17 0452 09/12/17 0500 09/12/17 0844 09/12/17 1151  BP:  (!) 102/58 106/79 105/72  Pulse: 64  (!) 47 (!) 44  Resp: 18   20  Temp: (!) 97.4 F (36.3 C)   98.4 F (36.9 C)  TempSrc: Oral   Oral  SpO2: 96%   96%  Weight: 128.9 kg     Height:        Intake/Output Summary (Last 24 hours) at 09/12/2017 1258 Last data filed at 09/12/2017 0856 Gross per 24 hour  Intake 1602 ml  Output 2225 ml  Net -623 ml   Filed Weights   09/10/17 0439 09/11/17 0539 09/12/17 0452  Weight: 129.4 kg 128.6 kg 128.9 kg    Examination:  General exam: NAD Respiratory system: Normal respiratory effort, CTA Cardiovascular system: S 1, S 2 RRR Gastrointestinal system: BS present, soft, nt Central nervous system: non focal.  Extremities: B/L unna boot. Right knee with less edema and tenderness.    Data Reviewed: I have personally reviewed following labs and imaging studies  CBC: Recent Labs  Lab 09/07/17 0842 09/08/17 0022  WBC 5.4 4.8  NEUTROABS 3.0 2.5  HGB 13.1 13.0  HCT 42.3 41.9  MCV 95.5 94.6  PLT 162 564   Basic Metabolic Panel: Recent Labs  Lab 09/10/17 0530 09/11/17 0609 09/11/17 1809 09/11/17 2350 09/12/17 0441  NA 136 135 128* 133* 136  K 4.0 4.5 4.9 5.0 4.5  CL 95* 95* 90* 95* 97*  CO2 30 29 26 27 31   GLUCOSE 203* 229* 434* 258* 183*  BUN 21 33* 38* 39* 40*  CREATININE 1.39* 1.53* 1.71* 1.08 1.62*  CALCIUM 10.9* 10.9* 10.5* 10.8* 10.6*   GFR: Estimated Creatinine Clearance: 64.8  mL/min (A) (by C-G formula based on SCr of 1.62 mg/dL (H)). Liver Function Tests: Recent Labs  Lab 09/07/17 0842 09/08/17 0022  AST 75* 54*  ALT 104* 91*  ALKPHOS 91 89  BILITOT 1.0 0.9  PROT 7.1 7.2  ALBUMIN 3.8 3.7   No results for input(s): LIPASE, AMYLASE in the last 168 hours. No results for input(s): AMMONIA in the last 168 hours. Coagulation Profile: No results for input(s): INR, PROTIME in the last 168 hours. Cardiac Enzymes: Recent Labs  Lab 09/07/17 1339  TROPONINI 0.12*   BNP (last 3 results) No results for input(s): PROBNP in the last 8760 hours. HbA1C: No results for input(s): HGBA1C in the last 72 hours. CBG: Recent Labs  Lab 09/11/17 1652 09/11/17 1922 09/11/17 2157 09/12/17 0743 09/12/17 1117  GLUCAP 523* 432* 367* 155* 268*   Lipid Profile: No results for input(s): CHOL, HDL, LDLCALC, TRIG, CHOLHDL, LDLDIRECT in the last 72 hours. Thyroid Function Tests: No results for input(s): TSH, T4TOTAL, FREET4, T3FREE, THYROIDAB in the last 72 hours. Anemia Panel: No results for input(s): VITAMINB12, FOLATE, FERRITIN, TIBC, IRON, RETICCTPCT in the last 72 hours. Sepsis Labs: No results for input(s): PROCALCITON, LATICACIDVEN in the last 168 hours.  No results found for this or any previous visit (from the past 240 hour(s)).       Radiology Studies: No results found.      Scheduled Meds: . allopurinol  100 mg Oral Daily  . aspirin EC  81 mg Oral Daily  . carvedilol  3.125 mg Oral BID WC  . digoxin  0.125 mg Oral Daily  . eplerenone  25 mg Oral Daily  . hydrocerin   Topical Weekly  . insulin aspart  0-15 Units Subcutaneous TID WC  . insulin aspart  0-5 Units Subcutaneous QHS  . insulin aspart  8 Units Subcutaneous TID WC  . insulin glargine  30 Units Subcutaneous BID  . polyethylene glycol  17 g Oral BID  . QUEtiapine  800 mg Oral QHS  . rivaroxaban  20 mg Oral Q breakfast  . rosuvastatin  40 mg Oral q1800  . sodium chloride flush  3 mL  Intravenous Q12H   Continuous Infusions: . sodium chloride       LOS: 5 days    Time spent: 35 minutes.     Elmarie Shiley, MD [facility] Pager [phone]  If 7PM-7AM, please contact night-coverage [email or web address] Password [facility] 09/12/2017, 12:58 PM

## 2017-09-12 NOTE — Progress Notes (Signed)
Advanced Heart Failure Rounding Note  PCP-Cardiologist: No primary care provider on file.   Subjective:     Admitted with volume overload and RLE wound.   Diuresed with IV lasix + metolazone. Weight down 26 pounds total.  Developed symptomatic hypotension yesterday. Given 500cc NS back. Diuretics and Entresto held. Weight up 1 pound. SBP 80s-> 105. Blood sugars remain high (on steroids for R knee gout - today day 3/3)  Breathing better. No orthopnea or PND. Anxious to go home.    Objective:   Weight Range: 128.9 kg Body mass index is 38.54 kg/m.   Vital Signs:   Temp:  [97.4 F (36.3 C)-98.4 F (36.9 C)] 98.4 F (36.9 C) (08/18 1151) Pulse Rate:  [44-92] 44 (08/18 1151) Resp:  [18-20] 20 (08/18 1151) BP: (88-106)/(49-81) 105/72 (08/18 1151) SpO2:  [96 %-100 %] 96 % (08/18 1151) Weight:  [128.9 kg] 128.9 kg (08/18 0452) Last BM Date: 09/10/17  Weight change: Filed Weights   09/10/17 0439 09/11/17 0539 09/12/17 0452  Weight: 129.4 kg 128.6 kg 128.9 kg    Intake/Output:   Intake/Output Summary (Last 24 hours) at 09/12/2017 1403 Last data filed at 09/12/2017 0856 Gross per 24 hour  Intake 1602 ml  Output 2225 ml  Net -623 ml      Physical Exam   General:  Sitting in chair No resp difficulty HEENT: normal Neck: supple. JVP 7 . Carotids 2+ bilat; no bruits. No lymphadenopathy or thryomegaly appreciated. Cor: PMI nondisplaced. Regular rate & rhythm. No rubs, gallops or murmurs. Lungs: clear Abdomen: obese  soft, nontender, nondistended. No hepatosplenomegaly. No bruits or masses. Good bowel sounds. Extremities: no cyanosis, clubbing, rash, +UNNA boots. Trace woody edema Neuro: alert & orientedx3, cranial nerves grossly intact. moves all 4 extremities w/o difficulty. Affect pleasant    Telemetry   NSR 70-80s personally reviewed.   EKG    N/A  Labs    CBC No results for input(s): WBC, NEUTROABS, HGB, HCT, MCV, PLT in the last 72 hours. Basic  Metabolic Panel Recent Labs    09/11/17 2350 09/12/17 0441  NA 133* 136  K 5.0 4.5  CL 95* 97*  CO2 27 31  GLUCOSE 258* 183*  BUN 39* 40*  CREATININE 1.08 1.62*  CALCIUM 10.8* 10.6*   Liver Function Tests No results for input(s): AST, ALT, ALKPHOS, BILITOT, PROT, ALBUMIN in the last 72 hours. No results for input(s): LIPASE, AMYLASE in the last 72 hours. Cardiac Enzymes No results for input(s): CKTOTAL, CKMB, CKMBINDEX, TROPONINI in the last 72 hours.  BNP: BNP (last 3 results) Recent Labs    05/13/17 0317 05/27/17 2140 09/07/17 0842  BNP 38.0 67.8 177.7*    ProBNP (last 3 results) No results for input(s): PROBNP in the last 8760 hours.   D-Dimer No results for input(s): DDIMER in the last 72 hours. Hemoglobin A1C No results for input(s): HGBA1C in the last 72 hours. Fasting Lipid Panel No results for input(s): CHOL, HDL, LDLCALC, TRIG, CHOLHDL, LDLDIRECT in the last 72 hours. Thyroid Function Tests No results for input(s): TSH, T4TOTAL, T3FREE, THYROIDAB in the last 72 hours.  Invalid input(s): FREET3  Other results:   Imaging    No results found.   Medications:     Scheduled Medications: . allopurinol  100 mg Oral Daily  . aspirin EC  81 mg Oral Daily  . carvedilol  3.125 mg Oral BID WC  . digoxin  0.125 mg Oral Daily  . eplerenone  25 mg  Oral Daily  . hydrocerin   Topical Weekly  . insulin aspart  0-15 Units Subcutaneous TID WC  . insulin aspart  0-5 Units Subcutaneous QHS  . insulin aspart  8 Units Subcutaneous TID WC  . insulin glargine  30 Units Subcutaneous BID  . polyethylene glycol  17 g Oral BID  . QUEtiapine  800 mg Oral QHS  . rivaroxaban  20 mg Oral Q breakfast  . rosuvastatin  40 mg Oral q1800  . sodium chloride flush  3 mL Intravenous Q12H    Infusions: . sodium chloride      PRN Medications: sodium chloride, acetaminophen, ondansetron (ZOFRAN) IV, sodium chloride flush    Patient Profile  [name] [name] is a 62  y.o. 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 Had history of acute renal failure in setting of rhabdo requiring short term HD.   Presented to [facility] 09/07/17 with worsening edema and SOB. Course complicated by draining leg wounds.   Assessment/Plan  1) Acute on chronic combined systolic/diastolic HF: EF 17-00%, grade II DD (10/2012) now decreased to 20-25% (05/2015) RV severely reduced. Echo 4/19 EF 30-35% RV ok. Had Gas 2014 with minimal CAD. Holter showed <3% PVC burden. - Echo 05/31/17 EF 25-30%, RV normal - Echo 814/19 EF 25-30% - Diuretics and Entresto on hold due to overdiuresis and hypotension. Now resolving.  - Restart torsemide 80 mg daily probably tomorrow am. Prior to admit he was taking 60 mg torsemide daily.  -  Continue eplerenone 25 mg daily (breast tenderness with spiro) - Continue carvedilol 3.125 mg BID, for now. Continue 0.125 mg digoxin.  - Hold Entesto. Resume entresto 49-51 mg twice a day when BP improved  Not a candidate for ICD with open wounds.  - Long history of non-compliance, but has repeatedly refused paramedicine. Will readdress  2) HTN - BP low yesterday due to overdiuresis. Now back to baseline. Management as above, 3) PAF:  - Continue Xarelto 20 mg daily.No bleeding - Maintaining NSR  4) ETOH abuse - Abstinent.No change.  5) Acute on CKD stage II - Creatinine up due to overdiuresis. Now improving. 1.1>1.3> 1.5 > 1.7 > 1.6 - management as above 6) ?OSA -Referred for sleep study previously. Has not followed up.  7) DMII, poorly uncontrolled - Per primary - Sugars up in setting of steroids for gout. Today is last day of steroids 8) Medication Noncompliance - He has been referred to HF Paramedicine program, but declined when called to set up a visit.No  change.  9) Frequent PVCs while inpatient - Holter monitor 06/17/17: 3% PVCs.  -Maintaining NSR 10) RLE wound  -  Started on Vancomycin in ED. WOC consulted.  11)  R knee pain = acute gout -Uric acid 16. Pain improved with Prednisone 40 mg daily for 3 days. Today day 3/3  - Increase allopurinol to 300 daily on d/c   Hopefully home in am.   Length of Stay: [place], MD  09/12/2017, 2:03 PM  Advanced Heart Failure Team Pager [phone] (M-F; 7a - 4p)  Please contact [facility] for night-coverage

## 2017-09-13 LAB — BASIC METABOLIC PANEL
Anion gap: 9 (ref 5–15)
BUN: 37 mg/dL — AB (ref 8–23)
CALCIUM: 10.6 mg/dL — AB (ref 8.9–10.3)
CO2: 32 mmol/L (ref 22–32)
Chloride: 97 mmol/L — ABNORMAL LOW (ref 98–111)
Creatinine, Ser: 1.41 mg/dL — ABNORMAL HIGH (ref 0.61–1.24)
GFR calc Af Amer: 60 mL/min — ABNORMAL LOW (ref >60)
GFR, EST NON AFRICAN AMERICAN: 52 mL/min — AB (ref >60)
GLUCOSE: 184 mg/dL — AB (ref 70–99)
Potassium: 4.1 mmol/L (ref 3.5–5.1)
Sodium: 138 mmol/L (ref 135–145)

## 2017-09-13 LAB — GLUCOSE, CAPILLARY
Glucose-Capillary: 189 mg/dL — ABNORMAL HIGH (ref 70–99)
Glucose-Capillary: 247 mg/dL — ABNORMAL HIGH (ref 70–99)

## 2017-09-13 MED ORDER — POLYETHYLENE GLYCOL 3350 17 G PO PACK: 17.0000 g | PACK | Freq: Two times a day (BID) | ORAL | 0 refills | Status: DC

## 2017-09-13 MED ORDER — SACUBITRIL-VALSARTAN 24-26 MG PO TABS: 1.0000 | ORAL_TABLET | Freq: Two times a day (BID) | ORAL | Status: DC

## 2017-09-13 MED ORDER — TORSEMIDE 20 MG PO TABS: 80.0000 mg | ORAL_TABLET | Freq: Every day | ORAL | 6 refills | Status: DC

## 2017-09-13 MED ORDER — DIGOXIN 125 MCG PO TABS: 0.1250 mg | ORAL_TABLET | Freq: Every day | ORAL | 0 refills | Status: DC

## 2017-09-13 MED ORDER — ALLOPURINOL 100 MG PO TABS: 100.0000 mg | ORAL_TABLET | Freq: Every day | ORAL | 0 refills | Status: AC

## 2017-09-13 MED ORDER — SACUBITRIL-VALSARTAN 24-26 MG PO TABS: 1.0000 | ORAL_TABLET | Freq: Two times a day (BID) | ORAL | 0 refills | Status: DC

## 2017-09-13 MED ORDER — INSULIN REGULAR HUMAN 100 UNIT/ML IJ SOLN: 10.0000 [IU] | Freq: Three times a day (TID) | INTRAMUSCULAR | 1 refills | Status: DC

## 2017-09-13 NOTE — Progress Notes (Addendum)
Advanced Heart Failure Rounding Note  PCP-Cardiologist: No primary care provider on file.   Subjective:    Admitted with volume overload and RLE wound.   Diuresed with IV lasix + metolazone. Weight down ~26 pounds total.  Creatinine pending this am. Given 500 cc NS back 09/11/17, Diuretics and Entresto held.   Feeling OK this am. Wants to go home. Denies SOB or lightheadedness/dizziness.   BP remains soft. 90s-110s at max. Blood sugars coming down after steroids stopped.   Objective:   Weight Range: 128.5 kg Body mass index is 38.44 kg/m.   Vital Signs:   Temp:  [98 F (36.7 C)-98.4 F (36.9 C)] 98 F (36.7 C) (08/19 0514) Pulse Rate:  [38-100] 100 (08/19 0538) Resp:  [18-20] 18 (08/19 0514) BP: (98-118)/(63-79) 98/76 (08/19 0514) SpO2:  [95 %-97 %] 95 % (08/19 0514) Weight:  [128.5 kg] 128.5 kg (08/19 0514) Last BM Date: 09/10/17  Weight change: Filed Weights   09/11/17 0539 09/12/17 0452 09/13/17 0514  Weight: 128.6 kg 128.9 kg 128.5 kg   Intake/Output:   Intake/Output Summary (Last 24 hours) at 09/13/2017 0721 Last data filed at 09/13/2017 0500 Gross per 24 hour  Intake 940 ml  Output 1250 ml  Net -310 ml    Physical Exam   General: Sitting in chair. No resp difficulty. HEENT: Normal Neck: Supple. JVP ~6-7 cm. Carotids 2+ bilat; no bruits. No thyromegaly or nodule noted. Cor: PMI nondisplaced. RRR, No M/G/R noted Lungs: CTAB, normal effort. Abdomen: Soft, non-tender, non-distended, no HSM. No bruits or masses. +BS  Extremities: No cyanosis, clubbing, or rash. +UNNA boots. Trace woody edema.  Neuro: Alert & orientedx3, cranial nerves grossly intact. moves all 4 extremities w/o difficulty. Affect pleasant   Telemetry   NSR 70-80s, personally reviewed.   EKG    No new tracings.    Labs    CBC No results for input(s): WBC, NEUTROABS, HGB, HCT, MCV, PLT in the last 72 hours. Basic Metabolic Panel Recent Labs    09/11/17 2350 09/12/17 0441    NA 133* 136  K 5.0 4.5  CL 95* 97*  CO2 27 31  GLUCOSE 258* 183*  BUN 39* 40*  CREATININE 1.08 1.62*  CALCIUM 10.8* 10.6*  MG  --  2.4   Liver Function Tests No results for input(s): AST, ALT, ALKPHOS, BILITOT, PROT, ALBUMIN in the last 72 hours. No results for input(s): LIPASE, AMYLASE in the last 72 hours. Cardiac Enzymes No results for input(s): CKTOTAL, CKMB, CKMBINDEX, TROPONINI in the last 72 hours.  BNP: BNP (last 3 results) Recent Labs    05/13/17 0317 05/27/17 2140 09/07/17 0842  BNP 38.0 67.8 177.7*    ProBNP (last 3 results) No results for input(s): PROBNP in the last 8760 hours.   D-Dimer No results for input(s): DDIMER in the last 72 hours. Hemoglobin A1C No results for input(s): HGBA1C in the last 72 hours. Fasting Lipid Panel No results for input(s): CHOL, HDL, LDLCALC, TRIG, CHOLHDL, LDLDIRECT in the last 72 hours. Thyroid Function Tests No results for input(s): TSH, T4TOTAL, T3FREE, THYROIDAB in the last 72 hours.  Invalid input(s): FREET3  Other results:  Imaging   No results found.  Medications:     Scheduled Medications: . allopurinol  100 mg Oral Daily  . aspirin EC  81 mg Oral Daily  . carvedilol  3.125 mg Oral BID WC  . digoxin  0.125 mg Oral Daily  . eplerenone  25 mg Oral Daily  .  hydrocerin   Topical Weekly  . insulin aspart  0-15 Units Subcutaneous TID WC  . insulin aspart  0-5 Units Subcutaneous QHS  . insulin aspart  8 Units Subcutaneous TID WC  . insulin glargine  30 Units Subcutaneous BID  . polyethylene glycol  17 g Oral BID  . QUEtiapine  800 mg Oral QHS  . rivaroxaban  20 mg Oral Q breakfast  . rosuvastatin  40 mg Oral q1800  . sodium chloride flush  3 mL Intravenous Q12H    Infusions: . sodium chloride      PRN Medications: sodium chloride, acetaminophen, ondansetron (ZOFRAN) IV, sodium chloride flush  Patient Profile  [name] [name] is a 62 y.o. 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 Had history of acute renal failure in setting of rhabdo requiring short term HD.   Presented to [facility] 09/07/17 with worsening edema and SOB. Course complicated by draining leg wounds.   Assessment/Plan  1) Acute on chronic combined systolic/diastolic HF: EF 22-02%, grade II DD (10/2012) now decreased to 20-25% (05/2015) RV severely reduced. Echo 4/19 EF 30-35% RV ok. Had [place] 2014 with minimal CAD. Holter showed <3% PVC burden. - Echo 05/31/17 EF 25-30%, RV normal - Echo 814/19 EF 25-30% - Diuretics and Entresto on hold due to overdiuresis and hypotension. Now resolving.  - Resume torsemide 80 mg daily pending labwork. Prior to admit he was taking 60 mg torsemide daily.  - Continue eplerenone 25 mg daily (breast tenderness with spiro) - Continue carvedilol 3.125 mg BID, for now. Continue 0.125 mg digoxin.  - Holding Entesto. BP remains soft. May need to send out on Losartan and resume Entresto as OP as tolerated. Will discuss with MD based on BMET - Not a candidate for ICD with open wounds.  - Long history of non-compliance, but has repeatedly refused paramedicine. Will readdress  2) HTN - BP low over weekend with overdiuresis.  - Improving. Management as above.  3) PAF:  - Continue Xarelto 20 mg daily.No bleeding - Maintaining NSR 4) ETOH abuse - Abstinent.No change.  5) Acute on CKD stage II - Creatinine up due to overdiuresis. Now improving. 1.1>1.3> 1.5 > 1.7 > 1.6 > 1.4 - Management as above 6) ?OSA -Referred for sleep study previously. Has not followed up. No change.  7) DMII, poorly uncontrolled - Per primary.  - Sugars up in setting of steroids for gout. Today is last day of steroids 8) Medication Noncompliance - He has been referred to HF Paramedicine program, but declined when called to  set up a visit. No change.   9) Frequent PVCs while inpatient - Holter monitor 06/17/17: 3% PVCs.  - Maintaining NSR 10) RLE wound  -  Started on Vancomycin in ED. WOC followed this admit.  11)  R knee pain = acute gout - Uric acid 16. Pain improved with Prednisone 40 mg daily for 3 days. (Finished 09/12/17)  - Recommend increasing allopurinol to 300 daily on d/c    Heart failure team will sign off as of 09/13/17  HF Medication Recommendations for Home: Entresto 24/26 mg BID starting 8/201/19 Digoxin 0.125 mg daily Eplerenone 25 mg daily Xarelto 20 mg daily Crestor 20 mg daily Torsemide 80 mg daily for home.   Follow up as an outpatient: Scheduled for 09/21/17. Placed on AVS  Length of Stay: 6  Shirley Friar, PA-C  09/13/2017, 7:21 AM  Advanced Heart Failure Team Pager [phone] (M-F; 7a - 4p)  Please contact [facility] for night-coverage  Patient seen and examined with the above-signed Advanced Practice Provider and/or Housestaff. I personally reviewed laboratory data, imaging studies and relevant notes. I independently examined the patient and formulated the important aspects of the plan. I have edited the note to reflect any of my changes or salient points. I have personally discussed the plan with the patient and/or family.  He is much improved. [place] for d/c today on above meds. Can leave UNNA boots on. We have arranged HHRN who can remove and also provide wound care as needed. F/u made in HF Clinic. We will sign off.   Glori Bickers, MD  10:04 PM

## 2017-09-13 NOTE — Discharge Summary (Signed)
Physician Discharge Summary  [name] [name] NGE:[id number] DOB: [birth year] DOA: 09/07/2017  PCP: Rogers Blocker, MD  Admit date: 09/07/2017 Discharge date: 09/13/2017  Admitted From: Home  Disposition: Home   Recommendations for Outpatient Follow-up:  1. Follow up with PCP in 1-2 weeks 2. Please obtain BMP/CBC in one week 3. Follow HF team. Needs labs to follow renal function.  4.   Home Health: yes.  Discharge Condition: stable.  CODE STATUS: full code.  Diet recommendation: Heart Healthy   Brief/Interim Summary: Brief Narrative: [name] [name] a 62 y.o.malewith medical history significant ofchronic systolic heart failure(EF 25-30% with diffuse hypokinesis in 5/19); HTN; afib on Xarelto; ETOH abuse in remission; cocaine abuse; Hep C; stage 2 CKD; probable OSA; DM; CVA; schizophrenia; and prostate CA presenting with SOB and LE edema.RLE is infected and he has fluid in LLE. He gets tired when he ants. +orthopnea. Symptoms have been going on for about 10 days. 9-10 pound weight gain. No chest pain. +PND.   ED Course:Acute CHF exacerbation - symptoms for 2 weeks. Elevated LFTs - ?hepatic congestion. CHF team consulted and will continue to consult.    Assessment & Plan:   Principal Problem:   Acute on chronic systolic heart failure (HCC) Active Problems:   HTN (hypertension)   Cocaine abuse (HCC)   Schizophrenia (HCC)   Paroxysmal atrial fibrillation (HCC)   Diabetes (HCC)   Elevated troponin   CKD (chronic kidney disease), stage III (HCC)   Stasis dermatitis of both legs   Acute on chronic systolic Heart failure exacerbation;  -Patientwith known systolic heartfailure presenting with worsening SOB and anasarca -CXR consistent with pulmonary edema -on lasix 80 mg IV BID. Change to torsemide.  Eplerenone,  entresto.  Negative 12 L.  Cr 1.4 Resume entresto, torsemide.  Stable.   HTN;  Hold  entresto.   Schizophrenia;  Continue with  Seroquel.   A fib;  Continue with carvedilol. Holding today HR in the 40  Bradycardia, PVC; check mg level.   DM;  lantus and SSI.  Increase lantus to 30 units  BID Meals coverage 8 units   CKD stage II;  Cr at 1.6  Torsemide on hold.    Cocaine use. Counseling provided.   Stasis dermatitis; wound care consulted.  Unna boot in place.   Right knee pain. Uric acid elevated. Started on prednisone. Last dose today  continue with  allopurinol.   Discharge Diagnoses:  Principal Problem:   Acute on chronic systolic heart failure (HCC) Active Problems:   HTN (hypertension)   Cocaine abuse (HCC)   Schizophrenia (HCC)   Paroxysmal atrial fibrillation (HCC)   Diabetes (HCC)   Elevated troponin   CKD (chronic kidney disease), stage III (HCC)   Stasis dermatitis of both legs    Discharge Instructions  Discharge Instructions    Diet - low sodium heart healthy   Complete by:  As directed    Increase activity slowly   Complete by:  As directed      Allergies as of 09/13/2017      Reactions   Heparin Other (See Comments)   Documented HIT under problem list Pt states he's not allergic. " They told me not take it anymore"   Spironolactone Other (See Comments)   Breast tenderness   Thorazine [chlorpromazine Hcl] Other (See Comments)   Body freezes up      Medication List    TAKE these medications   ACCU-CHEK FASTCLIX LANCETS Misc Check blood sugar 4 times  a day before meals and bedtime   ACCU-CHEK GUIDE w/Device Kit 1 each by Does not apply route 4 (four) times daily.   allopurinol 100 MG tablet Commonly known as:  ZYLOPRIM Take 1 tablet (100 mg total) by mouth daily. Start taking on:  09/14/2017   B-D UF III MINI PEN NEEDLES 31G X 5 MM Misc Generic drug:  Insulin Pen Needle Use four times daily as directed. Dx code E11.00, Z79.4   carvedilol 3.125 MG tablet Commonly known as:  COREG Take 1 tablet (3.125 mg total) by mouth 2 (two) times daily with a  meal.   digoxin 0.125 MG tablet Commonly known as:  LANOXIN Take 1 tablet (0.125 mg total) by mouth daily. Start taking on:  09/14/2017   eplerenone 25 MG tablet Commonly known as:  INSPRA Take 1 tablet (25 mg total) by mouth daily.   glucose blood test strip Check blood sugar 4 times a day before meal and bedtime   Insulin Glargine 100 UNIT/ML Solostar Pen Commonly known as:  LANTUS Inject 30 Units into the skin 2 (two) times daily. What changed:  when to take this   insulin regular 100 units/mL injection Commonly known as:  NOVOLIN R,HUMULIN R Inject 0.1 mLs (10 Units total) into the skin 3 (three) times daily before meals. 3 times with meals.Take 30 minutes before meal. What changed:    how much to take  how to take this  when to take this   metFORMIN 500 MG tablet Commonly known as:  GLUCOPHAGE Take 500 mg by mouth every morning.   polyethylene glycol packet Commonly known as:  MIRALAX / GLYCOLAX Take 17 g by mouth 2 (two) times daily.   QUEtiapine 400 MG tablet Commonly known as:  SEROQUEL Take 1 tablet (400 mg total) by mouth 2 (two) times daily for 21 days. What changed:    how much to take  when to take this   rivaroxaban 20 MG Tabs tablet Commonly known as:  XARELTO Take 1 tablet (20 mg total) by mouth daily.   rosuvastatin 40 MG tablet Commonly known as:  CRESTOR Take 1 tablet (40 mg total) by mouth daily.   sacubitril-valsartan 24-26 MG Commonly known as:  ENTRESTO Take 1 tablet by mouth 2 (two) times daily. Start taking on:  09/14/2017   torsemide 20 MG tablet Commonly known as:  DEMADEX Take 4 tablets (80 mg total) by mouth daily. What changed:  how much to take      [facility] Follow up.   Contact information: [name] [address] [phone]        [facility] Follow up on 09/21/2017.   Specialty:  Cardiology Why:   Heart Failure Follow @ [facility]/Dropoff at ER lot (enter blue "Specialty Clinics" awning) or under [facility] on [address] ([address] entrance, garage code: 1500, elevator 1st floor). Take all am meds, bring all med bottles. Contact information: [address] [id number] [place] [phone]         Allergies  Allergen Reactions  . Heparin Other (See Comments)    Documented HIT under problem list Pt states he's not allergic. " They told me not take it anymore"  . Spironolactone Other (See Comments)    Breast tenderness  . Thorazine [Chlorpromazine Hcl] Other (See Comments)    Body freezes up    Consultations: Heart failure team  Procedures/Studies: Dg Chest 2 View  Result Date: 09/07/2017 CLINICAL DATA:  62 year old male with bilateral lower extremity swelling and oozing. Chronic right leg wound. Shortness of breath for 3 days. EXAM: CHEST - 2 VIEW COMPARISON:  Chest radiographs 05/27/2017 and earlier. FINDINGS: Stable mild cardiomegaly and mediastinal contours. Stable somewhat low lung volumes. No pneumothorax, pleural effusion or consolidation. Pulmonary vascularity appears increased compared to April. Stable visualized osseous structures. Negative visible bowel gas pattern. IMPRESSION: 1. Increased pulmonary vascularity since April, suggesting mild or developing interstitial edema. 2. Stable mild cardiomegaly. Electronically Signed   By: [name]   On: 09/07/2017 08:59      Subjective: Feeling well   Discharge Exam: Vitals:   09/13/17 0941 09/13/17 1223  BP: 96/72 108/86  Pulse: 95 78  Resp:  18  Temp:  98.2 F (36.8 C)  SpO2:  100%   Vitals:   09/13/17 0514 09/13/17 0538 09/13/17 0941 09/13/17 1223  BP: 98/76  96/72 108/86  Pulse: (!) 38 100 95 78  Resp: 18   18  Temp: 98 F (36.7 C)   98.2 F (36.8 C)  TempSrc: Oral   Oral  SpO2: 95%   100%  Weight: 128.5 kg     Height:        General: Pt is  alert, awake, not in acute distress Cardiovascular: RRR, S1/S2 +, no rubs, no gallops Respiratory: CTA bilaterally, no wheezing, no rhonchi Abdominal: Soft, NT, ND, bowel sounds + Extremities: no edema, no cyanosis    The results of significant diagnostics from this hospitalization (including imaging, microbiology, ancillary and laboratory) are listed below for reference.     Microbiology: No results found for this or any previous visit (from the past 240 hour(s)).   Labs: BNP (last 3 results) Recent Labs    05/13/17 0317 05/27/17 2140 09/07/17 0842  BNP 38.0 67.8 938.1*   Basic Metabolic Panel: Recent Labs  Lab 09/11/17 0609 09/11/17 1809 09/11/17 2350 09/12/17 0441 09/13/17 0637  NA 135 128* 133* 136 138  K 4.5 4.9 5.0 4.5 4.1  CL 95* 90* 95* 97* 97*  CO2 _0 32  GLUCOSE 229* 434* 258* 183* 184*  BUN 33* 38* 39* 40* 37*  CREATININE 1.53* 1.71* 1.08 1.62* 1.41*  CALCIUM 10.9* 10.5* 10.8* 10.6* 10.6*  MG  --   --   --  2.4  --    Liver Function Tests: Recent Labs  Lab 09/07/17 0842 09/08/17 0022  AST 75* 54*  ALT 104* 91*  ALKPHOS 91 89  BILITOT 1.0 0.9  PROT 7.1 7.2  ALBUMIN 3.8 3.7   No results for input(s): LIPASE, AMYLASE in the last 168 hours. No results for input(s): AMMONIA in the last 168 hours. CBC: Recent Labs  Lab 09/07/17 0842 09/08/17 0022  WBC 5.4 4.8  NEUTROABS 3.0 2.5  HGB 13.1 13.0  HCT 42.3 41.9  MCV 95.5 94.6  PLT 162 156   Cardiac Enzymes: Recent Labs  Lab 09/07/17 1339  TROPONINI 0.12*   BNP: Invalid input(s): POCBNP CBG: Recent Labs  Lab 09/12/17 1117 09/12/17 1633 09/12/17 2126 09/13/17 0721 09/13/17 1142  GLUCAP 268* 268* 277* 189* 247*   D-Dimer No results for input(s): DDIMER in the last 72 hours. Hgb A1c No results for input(s): HGBA1C in the last 72 hours. Lipid Profile No results for input(s): CHOL, HDL, LDLCALC, TRIG, CHOLHDL, LDLDIRECT in the last 72 hours. Thyroid function studies No  results for input(s): TSH, T4TOTAL, T3FREE,  THYROIDAB in the last 72 hours.  Invalid input(s): FREET3 Anemia work up No results for input(s): VITAMINB12, FOLATE, FERRITIN, TIBC, IRON, RETICCTPCT in the last 72 hours. Urinalysis    Component Value Date/Time   COLORURINE STRAW (A) 05/12/2017 1658   APPEARANCEUR CLEAR 05/12/2017 1658   LABSPEC 1.015 05/12/2017 1658   PHURINE 5.0 05/12/2017 1658   GLUCOSEU >=500 (A) 05/12/2017 1658   HGBUR SMALL (A) 05/12/2017 1658   BILIRUBINUR NEGATIVE 05/12/2017 1658   KETONESUR NEGATIVE 05/12/2017 1658   PROTEINUR NEGATIVE 05/12/2017 1658   UROBILINOGEN 1.0 05/17/2014 1925   NITRITE NEGATIVE 05/12/2017 1658   LEUKOCYTESUR NEGATIVE 05/12/2017 1658   Sepsis Labs Invalid input(s): PROCALCITONIN,  WBC,  LACTICIDVEN Microbiology No results found for this or any previous visit (from the past 240 hour(s)).   Time coordinating discharge: 35 minutes.   SIGNED:   Elmarie Shiley, MD  [facility] 09/13/2017, 2:43 PM Pager   If 7PM-7AM, please contact night-coverage [email or web address] Password [email or web address]

## 2017-09-13 NOTE — Progress Notes (Signed)
Patient ready for discharge to home. All d/c instructions, diet, meds and follow up appts reviewed with patient. All personal belongings with pt.  No distress noted

## 2017-09-13 NOTE — Progress Notes (Signed)
Physical Therapy Treatment Patient Details Name: [name] [name] MRN: [id number] DOB: 12-31-1955 Today's Date: 09/13/2017    History of Present Illness [name] [name] is a 62 y.o. male with medical history significant of chronic systolic heart failure (EF 25-30% with diffuse hypokinesis in 5/19); HTN; afib on Xarelto; ETOH abuse in remission; cocaine abuse; Hep C; stage 2 CKD; probable OSA; DM; CVA; schizophrenia; and prostate CA presenting with SOB and LE edema.  RLE is infected and he has fluid in LLE.      PT Comments    Pt reluctant to participate with therapy today, however with encouragement pt agreed to a short walk. Pt is making progress towards his goals, however continues to be limited in safe mobility by decreased strength, self imposed activity intolerance and decreased safety awareness. Pt currently requires mod I for transfers and min guard for ambulation of 400 feet with RW. Pt requires cuing for safety with RW as he does not keep feet inside RW. D/c plans remain appropriate at this time. PT will continue to follow acutely.    Follow Up Recommendations  Home health PT;Supervision - Intermittent     Equipment Recommendations  None recommended by PT    Recommendations for Other Services       Precautions / Restrictions Precautions Precautions: Fall Restrictions Weight Bearing Restrictions: No    Mobility  Bed Mobility Overal bed mobility: Independent                Transfers Overall transfer level: Modified independent Equipment used: None             General transfer comment: use of chair arms to rise   Ambulation/Gait Ambulation/Gait assistance: Min guard Gait Distance (Feet): 400 Feet Assistive device: Rolling walker (2 wheeled) Gait Pattern/deviations: Step-to pattern Gait velocity: decreased Gait velocity interpretation: <1.8 ft/sec, indicate of risk for recurrent falls General Gait Details: min guard for ambulation with RW, externally  rotated hips cause toe out that does not fit within RW, pt reports this is how he normally walks,, vc for closer proximity to RW          Balance Overall balance assessment: Needs assistance Sitting-balance support: No upper extremity supported Sitting balance-Leahy Scale: Fair     Standing balance support: No upper extremity supported Standing balance-Leahy Scale: Fair                              Cognition Arousal/Alertness: Awake/alert Behavior During Therapy: Flat affect Overall Cognitive Status: Within Functional Limits for tasks assessed                                 General Comments: h/o schizophrenia         General Comments General comments (skin integrity, edema, etc.): bilateral Unna Boots, which pt does not like, VSS       Pertinent Vitals/Pain Pain Assessment: No/denies pain Pain Descriptors / Indicators: Throbbing           PT Goals (current goals can now be found in the care plan section) Acute Rehab PT Goals Patient Stated Goal: return home  PT Goal Formulation: With patient Time For Goal Achievement: 09/22/17 Potential to Achieve Goals: Good Progress towards PT goals: Progressing toward goals    Frequency    Min 2X/week      PT Plan Current plan remains appropriate  AM-PAC PT "6 Clicks" Daily Activity  Outcome Measure  Difficulty turning over in bed (including adjusting bedclothes, sheets and blankets)?: A Little Difficulty moving from lying on back to sitting on the side of the bed? : A Little Difficulty sitting down on and standing up from a chair with arms (e.g., wheelchair, bedside commode, etc,.)?: A Little Help needed moving to and from a bed to chair (including a wheelchair)?: A Little Help needed walking in hospital room?: A Little Help needed climbing 3-5 steps with a railing? : A Lot 6 Click Score: 17    End of Session Equipment Utilized During Treatment: Gait belt Activity Tolerance:  Patient tolerated treatment well;Patient limited by pain;Patient limited by fatigue Patient left: in chair;with call bell/phone within reach;with nursing/sitter in room Nurse Communication: Mobility status;Weight bearing status PT Visit Diagnosis: Other abnormalities of gait and mobility (R26.89);Muscle weakness (generalized) (M62.81)     Time: 2883-3744 PT Time Calculation (min) (ACUTE ONLY): 10 min  Charges:  $Gait Training: 8-22 mins                     [place] Wherry B. Migdalia Dk PT, DPT Acute Rehabilitation  [phone] Pager [phone]   [place] 09/13/2017, 2:47 PM

## 2017-09-13 NOTE — Care Management Important Message (Signed)
Important Message  Patient Details  Name: [name] [name] MRN: [id number] Date of Birth: 07-03-1955   Medicare Important Message Given:  Yes    [name] P [place] 09/13/2017, 1:49 PM

## 2017-09-13 NOTE — Consult Note (Signed)
   [facility] CM Inpatient Consult   09/13/2017  [name] [name] Nov 28, 1955 [id number]   Update:  Received information from his [place] that this patient is already active in their Advanced Illness telephonic program and he has been recommended for Cardiac Rehab program from transitional care home. Natividad Brood, RN BSN [facility] Liaison  [phone] business mobile phone Toll free office [phone]

## 2017-09-13 NOTE — Progress Notes (Signed)
CARDIAC REHAB PHASE I   PRE:  Rate/Rhythm: 80 SR PACs  BP:  Supine:   Sitting: 130/44  Standing:    SaO2: 94%RA    1004-1017 Came to see pt to walk. Pt stated he felt dizzy and did not want to walk at this time. Observed pt as I took vitals since he fell asleep so quickly. ? Sleep apnea. Vitals as taken above.  Will hold ambulation since pt so sleepy. Will let PT follow up later.     Graylon Good, RN BSN  09/13/2017 10:13 AM

## 2017-09-14 ENCOUNTER — Other Ambulatory Visit: Payer: Self-pay | Admitting: *Deleted

## 2017-09-14 NOTE — Patient Outreach (Signed)
[facility]) Care Management  09/14/2017  [name] 04-Jul-1955 [id number]   Referral received from hospital liaison as member was recently admitted to hospital 8/13-8/19 for heart failure.  Per chart, he also has history of hypertension, atrial fibrillation, stroke, diabetes, chronic kidney disease, and cocaine abuse (last tested positive 8/13).    Call placed to member to initiate transition of care, no answer to listed home number [phone]), unable to leave message.  Listed number [phone] no longer in service.  Call then placed to members sister [name] (listed on consent as next of kin), no answer.  HIPAA compliant voice message left.  Will await call back.    Update:  Call received back from member's sister.  Identity verified.  She report he is "doing alright."  She provide assistance to him but state he is home at this time and will be able to accept calls.  Call placed to member, identity verified.  This care manager introduced self and purpose of call.  He report he is doing well, denies any urgent concerns.  Agrees to home visit tomorrow. Will complete transition of care & initial assessment at that time.    [name], [place], MSN [place] [phone]

## 2017-09-15 ENCOUNTER — Other Ambulatory Visit: Payer: Self-pay | Admitting: *Deleted

## 2017-09-15 ENCOUNTER — Encounter: Payer: Self-pay | Admitting: *Deleted

## 2017-09-15 NOTE — Patient Outreach (Signed)
[place] [facility]) Care Management   09/15/2017  [name] [name] [birth year] [id number]  [name] [name] is an 63 y.o. male  Subjective:   Member alert and oriented x3, denies complaints of pain or discomfort, denies shortness of breath.  Report compliance with medications, concerned with new insulin (Humulin R) as he is used to administering through pen, but Humulin was provided in a vial.    Objective:   Review of Systems  Constitutional: Negative.   HENT: Negative.   Eyes: Negative.   Respiratory: Negative.   Cardiovascular: Positive for leg swelling.  Gastrointestinal: Negative.   Genitourinary: Negative.   Musculoskeletal: Negative.   Skin:       Una boots  Neurological: Negative.   Endo/Heme/Allergies: Negative.   Psychiatric/Behavioral: Negative.     Physical Exam  Constitutional: He is oriented to person, place, and time. He appears well-developed and well-nourished.  Neck: Normal range of motion.  Cardiovascular: Normal heart sounds.  Irregular  Respiratory: Effort normal and breath sounds normal.  GI: Soft. Bowel sounds are normal.  Musculoskeletal: Normal range of motion.  Neurological: He is alert and oriented to person, place, and time.  Skin: Skin is warm and dry.   BP 116/64   Pulse (!) 58   Resp 16   Ht 1.829 m (6')   Wt 282 lb 3 oz (128 kg)   SpO2 96%   BMI 38.27 kg/m   Encounter Medications:   Outpatient Encounter Medications as of 09/15/2017  Medication Sig  . ACCU-CHEK FASTCLIX LANCETS MISC Check blood sugar 4 times a day before meals and bedtime  . allopurinol (ZYLOPRIM) 100 MG tablet Take 1 tablet (100 mg total) by mouth daily.  . B-D UF III MINI PEN NEEDLES 31G X 5 MM MISC Use four times daily as directed. Dx code E11.00, Z79.4  . Blood Glucose Monitoring Suppl (ACCU-CHEK GUIDE) w/Device KIT 1 each by Does not apply route 4 (four) times daily.  . carvedilol (COREG) 3.125 MG tablet Take 1 tablet (3.125 mg total) by mouth 2  (two) times daily with a meal.  . digoxin (LANOXIN) 0.125 MG tablet Take 1 tablet (0.125 mg total) by mouth daily.  Marland Kitchen eplerenone (INSPRA) 25 MG tablet Take 1 tablet (25 mg total) by mouth daily.  Marland Kitchen glucose blood (ACCU-CHEK GUIDE) test strip Check blood sugar 4 times a day before meal and bedtime  . insulin regular (NOVOLIN R,HUMULIN R) 100 units/mL injection Inject 0.1 mLs (10 Units total) into the skin 3 (three) times daily before meals. 3 times with meals.Take 30 minutes before meal.  . metFORMIN (GLUCOPHAGE) 500 MG tablet Take 500 mg by mouth every morning.  . rivaroxaban (XARELTO) 20 MG TABS tablet Take 1 tablet (20 mg total) by mouth daily.  . rosuvastatin (CRESTOR) 40 MG tablet Take 1 tablet (40 mg total) by mouth daily.  . sacubitril-valsartan (ENTRESTO) 24-26 MG Take 1 tablet by mouth 2 (two) times daily.  Marland Kitchen torsemide (DEMADEX) 20 MG tablet Take 4 tablets (80 mg total) by mouth daily.  . Insulin Glargine (LANTUS SOLOSTAR) 100 UNIT/ML Solostar Pen Inject 30 Units into the skin 2 (two) times daily. (Patient taking differently: Inject 30 Units into the skin every morning. )  . polyethylene glycol (MIRALAX / GLYCOLAX) packet Take 17 g by mouth 2 (two) times daily. (Patient not taking: Reported on 09/15/2017)  . QUEtiapine (SEROQUEL) 400 MG tablet Take 1 tablet (400 mg total) by mouth 2 (two) times daily for 21 days. (Patient taking  differently: Take 800 mg by mouth at bedtime. )   No facility-administered encounter medications on file as of 09/15/2017.     Functional Status:   In your present state of health, do you have any difficulty performing the following activities: 09/15/2017 09/07/2017  Hearing? N N  Vision? Y N  Difficulty concentrating or making decisions? Y N  Walking or climbing stairs? Y N  Comment - -  Dressing or bathing? Y N  Doing errands, shopping? Y N  Preparing Food and eating ? Y -  Using the Toilet? N -  In the past six months, have you accidently leaked urine? N -   Do you have problems with loss of bowel control? N -  Managing your Medications? Y -  Managing your Finances? Y -  Housekeeping or managing your Housekeeping? Y -  Some recent data might be hidden    Fall/Depression Screening:    Fall Risk  09/15/2017 10/01/2016 06/23/2016  Falls in the past year? No No No  Number falls in past yr: - - -  Comment - - -  Injury with Fall? - - -  Comment - - -  Risk Factor Category  - - -  Risk for fall due to : - - -  Risk for fall due to: Comment - - -   PHQ 2/9 Scores 09/15/2017 10/01/2016 06/23/2016 05/26/2016 12/30/2015 12/23/2015 09/10/2015  PHQ - 2 Score 0 0 0 0 0 0 0    Assessment:    Met with member at schedule time.  Sister arrived during visit as well as [place], Aniceto Boss, for their initial assessment.  Assessment complete, no distress noted.  He is aware of plan to have [facility] provide scale for their telemonitoring program.  State he has used the system before, is comfortable using again.  Member report he has appointment with new primary MD next week, also has appointment with heart failure clinic.  Denies transportation is an issue as he either uses [facility] or has his caseworker or sister take him to appointments.    Sister and member both deny any urgent concerns at this time, advised to contact this care manager with questions.  Also advised of 24 hour nurse triage line.    Plan:   Will follow up with member/sister within the next week.  THN CM Care Plan Problem One     Most Recent Value  Care Plan Problem One  Risk for readmission related to heart failure management as evidenced by recent hospitalization  Role Documenting the Problem One  Care Management [place] for Problem One  Active  [facility] Long Term Goal   Member will not be readmitted to hospital within the next 31 days  THN Long Term Goal Start Date  09/15/17  Interventions for Problem One Long Term Goal  Discussed with member the importance of  following discharge instructions, including follow up appointments, medications, diet, and home health involvement, to decrease the risk of readmission  THN CM Short Term Goal #1   Member will take medications as prescribed over the next 4 weeks  THN CM Short Term Goal #1 Start Date  09/15/17  Interventions for Short Term Goal #1  Medications reviewed in the home, educated member on process of filling pill box  THN CM Short Term Goal #2   Member will keep and attend follow up appointments within the next 2 weeks  THN CM Short Term Goal #2 Start Date  09/15/17  Interventions for Short Term Goal #2  Upcoming appointments reviewed with member and sister, written for reminders.  Sister aware of appointments and confirmed she will provide transportation  [facility] CM Short Term Goal #3  Member will weigh self daily and record readings over the next 4 weeks  THN CM Short Term Goal #3 Start Date  09/15/17  Interventions for Short Tern Goal #3  Member and sister educated on importance of daily monitoring of weights.  Reviewed heart failure zones and when to contact MD     [name], RN, MSN [place] [phone]

## 2017-09-16 ENCOUNTER — Telehealth (HOSPITAL_COMMUNITY): Payer: Self-pay | Admitting: *Deleted

## 2017-09-16 NOTE — Telephone Encounter (Signed)
RN called to report pts vest reading was 39%. No edema no shortness of breath. Per Amy no changes.

## 2017-09-21 ENCOUNTER — Ambulatory Visit (HOSPITAL_COMMUNITY)
Admit: 2017-09-21 | Discharge: 2017-09-21 | Disposition: A | Discharge status: Home / Self Care | Payer: Medicare Other | Source: Ambulatory Visit | Attending: Cardiology | Admitting: Cardiology

## 2017-09-21 VITALS — BP 136/68 | HR 52 | Wt 292.2 lb

## 2017-09-21 DIAGNOSIS — K760 Fatty (change of) liver, not elsewhere classified: Secondary | ICD-10-CM | POA: Insufficient documentation

## 2017-09-21 DIAGNOSIS — Z8249 Family history of ischemic heart disease and other diseases of the circulatory system: Secondary | ICD-10-CM | POA: Insufficient documentation

## 2017-09-21 DIAGNOSIS — Z888 Allergy status to other drugs, medicaments and biological substances status: Secondary | ICD-10-CM | POA: Diagnosis not present

## 2017-09-21 DIAGNOSIS — I5022 Chronic systolic (congestive) heart failure: Secondary | ICD-10-CM

## 2017-09-21 DIAGNOSIS — I2510 Atherosclerotic heart disease of native coronary artery without angina pectoris: Secondary | ICD-10-CM | POA: Insufficient documentation

## 2017-09-21 DIAGNOSIS — I480 Paroxysmal atrial fibrillation: Secondary | ICD-10-CM | POA: Insufficient documentation

## 2017-09-21 DIAGNOSIS — K219 Gastro-esophageal reflux disease without esophagitis: Secondary | ICD-10-CM | POA: Insufficient documentation

## 2017-09-21 DIAGNOSIS — I429 Cardiomyopathy, unspecified: Secondary | ICD-10-CM | POA: Diagnosis not present

## 2017-09-21 DIAGNOSIS — Z91199 Patient's noncompliance with other medical treatment and regimen due to unspecified reason: Secondary | ICD-10-CM

## 2017-09-21 DIAGNOSIS — I10 Essential (primary) hypertension: Secondary | ICD-10-CM | POA: Diagnosis not present

## 2017-09-21 DIAGNOSIS — I493 Ventricular premature depolarization: Secondary | ICD-10-CM | POA: Insufficient documentation

## 2017-09-21 DIAGNOSIS — E785 Hyperlipidemia, unspecified: Secondary | ICD-10-CM | POA: Diagnosis not present

## 2017-09-21 DIAGNOSIS — Z9114 Patient's other noncompliance with medication regimen: Secondary | ICD-10-CM | POA: Insufficient documentation

## 2017-09-21 DIAGNOSIS — E1122 Type 2 diabetes mellitus with diabetic chronic kidney disease: Secondary | ICD-10-CM | POA: Insufficient documentation

## 2017-09-21 DIAGNOSIS — N182 Chronic kidney disease, stage 2 (mild): Secondary | ICD-10-CM | POA: Insufficient documentation

## 2017-09-21 DIAGNOSIS — Z9119 Patient's noncompliance with other medical treatment and regimen: Secondary | ICD-10-CM

## 2017-09-21 DIAGNOSIS — Z7901 Long term (current) use of anticoagulants: Secondary | ICD-10-CM | POA: Diagnosis not present

## 2017-09-21 DIAGNOSIS — Z8619 Personal history of other infectious and parasitic diseases: Secondary | ICD-10-CM | POA: Diagnosis not present

## 2017-09-21 DIAGNOSIS — E781 Pure hyperglyceridemia: Secondary | ICD-10-CM | POA: Diagnosis not present

## 2017-09-21 DIAGNOSIS — Z794 Long term (current) use of insulin: Secondary | ICD-10-CM | POA: Diagnosis not present

## 2017-09-21 DIAGNOSIS — F1010 Alcohol abuse, uncomplicated: Secondary | ICD-10-CM | POA: Insufficient documentation

## 2017-09-21 DIAGNOSIS — I5042 Chronic combined systolic (congestive) and diastolic (congestive) heart failure: Secondary | ICD-10-CM | POA: Diagnosis present

## 2017-09-21 DIAGNOSIS — I130 Hypertensive heart and chronic kidney disease with heart failure and stage 1 through stage 4 chronic kidney disease, or unspecified chronic kidney disease: Secondary | ICD-10-CM | POA: Diagnosis not present

## 2017-09-21 DIAGNOSIS — F209 Schizophrenia, unspecified: Secondary | ICD-10-CM | POA: Insufficient documentation

## 2017-09-21 DIAGNOSIS — Z8701 Personal history of pneumonia (recurrent): Secondary | ICD-10-CM | POA: Insufficient documentation

## 2017-09-21 DIAGNOSIS — Z79899 Other long term (current) drug therapy: Secondary | ICD-10-CM | POA: Diagnosis not present

## 2017-09-21 DIAGNOSIS — Z87891 Personal history of nicotine dependence: Secondary | ICD-10-CM | POA: Insufficient documentation

## 2017-09-21 DIAGNOSIS — Z8673 Personal history of transient ischemic attack (TIA), and cerebral infarction without residual deficits: Secondary | ICD-10-CM | POA: Insufficient documentation

## 2017-09-21 DIAGNOSIS — Z8719 Personal history of other diseases of the digestive system: Secondary | ICD-10-CM | POA: Insufficient documentation

## 2017-09-21 DIAGNOSIS — C61 Malignant neoplasm of prostate: Secondary | ICD-10-CM | POA: Insufficient documentation

## 2017-09-21 LAB — BASIC METABOLIC PANEL
ANION GAP: 12 (ref 5–15)
BUN: 18 mg/dL (ref 8–23)
CHLORIDE: 102 mmol/L (ref 98–111)
CO2: 26 mmol/L (ref 22–32)
Calcium: 10.6 mg/dL — ABNORMAL HIGH (ref 8.9–10.3)
Creatinine, Ser: 1.24 mg/dL (ref 0.61–1.24)
GFR calc Af Amer: >60 mL/min (ref >60)
Glucose, Bld: 257 mg/dL — ABNORMAL HIGH (ref 70–99)
POTASSIUM: 4.1 mmol/L (ref 3.5–5.1)
SODIUM: 140 mmol/L (ref 135–145)

## 2017-09-21 LAB — BRAIN NATRIURETIC PEPTIDE: B NATRIURETIC PEPTIDE 5: 99.1 pg/mL (ref 0.0–100.0)

## 2017-09-21 NOTE — Progress Notes (Signed)
Patient ID: [name] [name], male   DOB: 15-Jan-1956, 62 y.o.   MRN: [id number]    Advanced Heart Failure Clinic Note   Primary Care: Internal Medicine clinic, Dr. Silverio Decamp Primary Cardiologist: Dr. Doylene Canard  GI: Dr Ardis Hughs.  Primary HF: Dr. Haroldine Laws   HPI: [name] [name] is a 62 y.o. 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 Had history of acute renal failure in setting of rhabdo requiring short term HD.     Please see previous progress note from 08/04/17 for information regarding admissions prior to 07/2017  Admitted 8/13 - 8/19 with A/C CHF. Diuresed with IV lasix and metolazone for a total of 26 lbs down. Restarted on Entresto and Torsemide 80 mg daily for home. Discharge weight 282 lbs.   He presents today for post hospital follow up. He is feeling much better from recent hospitalization. He has slept the past few nights the best he has "in years". No SOB getting around the house, and orthopnea has much improved. He denies PND or edema. No CP, dizziness, or palpitations. No weighing at home as his scale broke. Waiting for [facility] to deliver replacement. He is taking all of his medications as directed, and is now limiting his salt and fluid intake more closely. Hasn't needed any extra torsemide.   ECHO 10/27/12 EF 40-45%  ECHO 06/04/15 LVEF 20-25%, decreased diastolic compliance, LAE, RV severely reduced.  ECHO 11/26/15 EF 30%  Echo 12/26/15 EF 30-35% RV ok.  ECHO 05/31/17 EF 25-30%  RHC 08/2015 RA = 7 RV = 35/10 PA = 37/15 (25) PCW = 10 Fick cardiac output/index = 4.3/1.7 PVR = 3.5 WU Ao sat = 95% PA sat = 58%, 58% Assessment: 1. Volume status looks good. 2. Cardiac output depressed  2014 LHC -  Left main: Normal LAD: Large vessel gives of 2 diagonals.  Normal LCX: Very large vessel with 3 OMs. 20-30% plaque in midsection.  RCA: Dominant. Normal LV-gram done in the RAO projection: Ejection fraction = 35-40% global HK Assessment: 1. Minimal CAD  Labs 02/12/13 K 4.3 Creatinine 0.98          08/2013: K 3.9, creatinine 1.27, Mag 1.8, pro-BNP 392, Troponin 0.05          06/07/15: K 4.2, creatinine 1.21          07/10/2015: K 3.9 Creatinine 1.16           09/03/2015: K 4.7 Creatinine 1.32          09/11/2015: K 4.1 Creatinine 1.29   SH: Quit smoking 9 years ago. Lives alone. Quit ETOH 09-24-2013 FH: Mom died from MI in her early 18s  Brother MI and deceased. Does not know about fathers health history.   Review of systems complete and found to be negative unless listed in HPI.   Past Medical History:  Diagnosis Date  . Atrial fibrillation ([place])   . Cancer [facility])    Prostate cancer-bx. 3 weeks ago  . Cataract   . Chronic combined systolic and diastolic CHF (congestive heart failure) (HCC)    a) EF 40-45% per 2D echo (02/2012) with grade 1 DD b)  NICM c) RHC (04/2012): RA: 4, RV 45/3/4, PA 42/9 (24), PCWP 14, Fick CO/CI: 5.2 /2.2, PVR 1.9 WU, PA 62% and 64% d) ECHO (10/2012) EF 40-45%,  grade II DD, RV nl  . CKD (chronic kidney disease) stage 2, GFR 60-89 ml/min    BL SCr approximately 1-1.3  . Colitis 05/2009   History of colitis of ascending colon noted on CT abd/pelvis (05/2009), with interval resolution with subsequent CT  . Continuous chronic alcoholism ([place])   . Degenerative lumbar spinal stenosis    s/p L2-3, L3-4, L4-5 laminectomy partial facetectomy, and bilateral foraminotomy  . Diabetes mellitus without complication ([place])    Type II  . Dysrhythmia    A. Fib  . Family history of adverse reaction to anesthesia    "sister, can't go to sleep"  . GERD (gastroesophageal reflux disease)   . H/O cocaine abuse   . Hepatic steatosis    suspected 2/2 alcohol abuse  . Hepatitis C   . History of pancreatitis 01/2011   Admission for acute  pancreatitis presumed 2/2 ongoing alcohol abuse- and hypertriglyceridemia  . History of pneumonia   . HIT (heparin-induced thrombocytopenia) ([place])   . Hyperlipidemia   . Hypertension    uncontrolled with medication noncompliance  . Hypertriglyceridemia   . NICM (nonischemic cardiomyopathy) ([place])    a. LHC (04/2012): nl arteries  . PFO (patent foramen ovale) 01/2012   with right to left shunt, noted per TEE in evaluation for source of embolic stroke in 01/3242  . Rhabdomyolysis 02/22/2012   H/O rhabdomyolysis in 01/2012 that was idiopathic, cause never identified  . Schizophrenia, schizo-affective ([place])   . Shortness of breath dyspnea   . Splenic cyst   . Stroke ([place]) 01/2012   Small cerebellar infarcts right greater than left as well as questionable acute left external capsule and caudate nuclear punctate lacunar infarcts noted per MRI (01/2012) - presumed to be embolic likely source PFO with right to left shunt (noted per TEE 01/ 2014)    Current Outpatient Medications  Medication Sig Dispense Refill  . ACCU-CHEK FASTCLIX LANCETS MISC Check blood sugar 4 times a day before meals and bedtime 204 each 5  . allopurinol (ZYLOPRIM) 100 MG tablet Take 1 tablet (100 mg total) by mouth daily. 30 tablet 0  . B-D UF III MINI PEN NEEDLES 31G X 5 MM MISC Use four times daily as directed. Dx code E11.00, Z79.4 100 each 3  . Blood Glucose Monitoring Suppl (ACCU-CHEK GUIDE) w/Device KIT 1 each by Does not apply route 4 (four) times daily. 1 kit 1  . cariprazine (VRAYLAR) capsule Take 3 mg by mouth 3 times/day as needed-between meals & bedtime.    . carvedilol (COREG) 3.125 MG tablet Take 1 tablet (3.125 mg total) by mouth 2 (two) times daily with a meal. 60 tablet 6  . digoxin (LANOXIN) 0.125 MG tablet Take 1 tablet (0.125 mg total) by mouth daily. 30 tablet 0  . eplerenone (INSPRA) 25 MG tablet Take 1 tablet (25 mg total) by mouth daily. 30 tablet 6  . glucose blood (ACCU-CHEK GUIDE) test strip Check  blood sugar 4 times a day before meal and bedtime 150 each 5  . Insulin Glargine (LANTUS SOLOSTAR) 100 UNIT/ML Solostar Pen Inject 30 Units into the skin 2 (two) times daily. (Patient taking differently: Inject 30 Units into the skin every morning. ) 18 mL 0  . insulin regular (NOVOLIN R,HUMULIN R) 100 units/mL injection Inject 0.1 mLs (10 Units total) into the skin 3 (three) times daily before meals. 3 times with meals.Take 30 minutes before meal. 15 mL 1  . metFORMIN (GLUCOPHAGE) 500 MG tablet Take 500 mg by  mouth every morning.  3  . QUEtiapine (SEROQUEL) 400 MG tablet Take 1 tablet (400 mg total) by mouth 2 (two) times daily for 21 days. (Patient taking differently: Take 800 mg by mouth at bedtime. ) 42 tablet 0  . rivaroxaban (XARELTO) 20 MG TABS tablet Take 1 tablet (20 mg total) by mouth daily. 30 tablet 6  . rosuvastatin (CRESTOR) 40 MG tablet Take 1 tablet (40 mg total) by mouth daily. 90 tablet 3  . sacubitril-valsartan (ENTRESTO) 24-26 MG Take 1 tablet by mouth 2 (two) times daily. 60 tablet 0  . torsemide (DEMADEX) 20 MG tablet Take 4 tablets (80 mg total) by mouth daily. 90 tablet 6   No current facility-administered medications for this encounter.     Allergies  Allergen Reactions  . Heparin Other (See Comments)    Documented HIT under problem list Pt states he's not allergic. " They told me not take it anymore"  . Spironolactone Other (See Comments)    Breast tenderness  . Thorazine [Chlorpromazine Hcl] Other (See Comments)    Body freezes up     Vitals:   09/21/17 1128  BP: 136/68  Pulse: (!) 52  SpO2: 94%  Weight: 132.5 kg (292 lb 3.2 oz)   Wt Readings from Last 3 Encounters:  09/21/17 132.5 kg (292 lb 3.2 oz)  09/15/17 128 kg (282 lb 3 oz)  09/13/17 128.5 kg (283 lb 6.4 oz)     PHYSICAL EXAM: General: No resp difficulty. HEENT: Normal Neck: Supple. JVP ~8. Carotids 2+ bilat; no bruits. No thyromegaly or nodule noted. Cor: PMI nondisplaced. RRR, No M/G/R  noted Lungs: CTAB, normal effort. Abdomen: Soft, non-tender, +distended, no HSM. No bruits or masses. +BS  Extremities: No cyanosis, clubbing, or rash. R and LLE no edema.  Neuro: Alert & orientedx3, cranial nerves grossly intact. moves all 4 extremities w/o difficulty. Affect pleasant   ASSESSMENT /PLAN 1) Chronic combined systolic/diastolic HF: EF 29-52%, grade II DD (10/2012) now decreased to 20-25%  (05/2015) RV severely reduced. Echo 4/19 EF 30-35% RV ok. Had [place] 2014 with minimal CAD. Holter showed <3% PVC burden.  - Echo 05/31/17 EF 25-30%, RV normal - NYHA III. Volume status stable on exam.  - Continue torsemide 80 mg daily.  - Continue eplerenone 25 mg daily (breast tenderness with spiro, now resolved) - Continue carvedilol 3.125 mg BID - Continue Entresto 24/26 mg BID. BMET today.  - Reinforced fluid restriction to < 2 L daily, sodium restriction to less than 2000 mg daily, and the importance of daily weights.   - Stressed the importance of taking all medications every day. Refuses HF paramedicine.   2) HTN - Meds as above.  3) PAF:  - Continue Xarelto 20 mg daily. Denies bleeding.  - Regular on exam today 4) ETOH abuse - Abstinent. No change.  5) CKD stage II - BMET today.  6) ?OSA - Needs sleep study. Awaiting word for scheduling. Will need to re-schedule if no word by next visit.  7) DMII - Per PCP. No change.  8) Medication Noncompliance - He has been referred to HF Paramedicine program, but declined when called to set up a visit. No change.  9) Frequent PVCs while inpatient - Holter monitor 06/17/17: 3% PVCs. No change.  10) Hyperlipidemia - Continue crestor. No change.   Doing very well after recent hospitalization. No changes today. Labs as above. RTC 4 weeks.   Shirley Friar, PA-C  11:31 AM  Greater than 50% of  the 25 minute visit was spent in counseling/coordination of care regarding disease state education, salt/fluid restriction, sliding scale  diuretics, and medication compliance.

## 2017-09-21 NOTE — Patient Instructions (Signed)
Labs today (will call for abnormal results, otherwise no news is good news)  Follow up in 4 weeks 

## 2017-09-22 ENCOUNTER — Other Ambulatory Visit: Payer: Self-pay | Admitting: *Deleted

## 2017-09-22 NOTE — Patient Outreach (Signed)
[facility]) Care Management  09/22/2017  [name] [name] 12-10-1955 [id number]   Notified by [place] that since member is active with the Advanced Illness (AI) program he is not able to be involved with the heart failure program (tele monitoring).  Call placed to [facility], nurse from [place], and notified her of concern.  Request made to have member be active with telemonitoring program with [facility].  She will place request.  Weekly transition of care call placed to member, he report he is doing "real good."  He denies shortness of breath, state he is now able to sleep flat rather than propped up on pillows.  State his insulin has changed from the vial back to the pen.  He is made aware that [place] will be providing scale, advised that he will need to start daily weights immediately.  He denies any urgent concerns at this time.  Advised to contact this care manager with any questions.  THN CM Care Plan Problem One     Most Recent Value  Care Plan Problem One  Risk for readmission related to heart failure management as evidenced by recent hospitalization  Role Documenting the Problem One  Care Management [place] for Problem One  Active  [facility] Long Term Goal   Member will not be readmitted to hospital within the next 31 days  THN Long Term Goal Start Date  09/15/17  Interventions for Problem One Long Term Goal  Plan of care discussed with Advanced home Care nurse  [facility] CM Short Term Goal #1   Member will take medications as prescribed over the next 4 weeks  THN CM Short Term Goal #1 Start Date  09/15/17  Interventions for Short Term Goal #1  Confirmed that member's medication concersn (insulin) has been addressed.  Advised of importance of taking medications as prescribed in effort to decrease complications of medical conditions  THN CM Short Term Goal #2   Member will keep and attend follow up appointments within the next 2 weeks  THN CM  Short Term Goal #2 Start Date  09/15/17  [facility] CM Short Term Goal #2 Met Date  09/22/17  THN CM Short Term Goal #3  Member will weigh self daily and record readings over the next 4 weeks  THN CM Short Term Goal #3 Start Date  09/15/17  Interventions for Short Tern Goal #3  Call placed to nurse with [facility] to order scale for tele monitoring     [name], RN, MSN [place] Manager [phone]

## 2017-10-01 ENCOUNTER — Other Ambulatory Visit: Payer: Self-pay | Admitting: *Deleted

## 2017-10-01 NOTE — Patient Outreach (Signed)
[facility] [facility]) Care Management  10/01/2017  [name] [name] [birth year] [id number]   Weekly transition of care call placed to member.  He report he is doing "good." Denies any shortness of breath or chest discomfort.  State he has been taking medications as prescribed, blood sugar today was 230s.  Report he had eggs this morning for breakfast, couldn't recall all he had for dinner last night, remember eating broccoli with cheese.  Educated on diabetic/low sodium diet, he verbalizes understanding.  Report he still has not seen his new primary MD yet, will follow up with sister as he was supposed to have appointment last week.  He denies any urgent concerns, advised to contact with questions.  Will follow up next week.  THN CM Care Plan Problem One     Most Recent Value  Care Plan Problem One  Risk for readmission related to heart failure management as evidenced by recent hospitalization  Role Documenting the Problem One  Care Management [place] for Problem One  Active  [facility] Long Term Goal   Member will not be readmitted to hospital within the next 31 days  THN Long Term Goal Start Date  09/15/17  Interventions for Problem One Long Term Goal  Member educated on importance of following plan of care, daily weights, diet management, and medication management  THN CM Short Term Goal #1   Member will take medications as prescribed over the next 4 weeks  THN CM Short Term Goal #1 Start Date  09/15/17  [name] CM Short Term Goal #1 Met Date  10/01/17  THN CM Short Term Goal #3  Member will weigh self daily and record readings over the next 4 weeks  THN CM Short Term Goal #3 Start Date  09/15/17  Interventions for Short Tern Goal #3  Will follow up with [facility] to confirm scale has been ordered.  If not, will order through [place], RN, MSN [place] Manager [phone]

## 2017-10-08 ENCOUNTER — Other Ambulatory Visit: Payer: Self-pay | Admitting: *Deleted

## 2017-10-08 NOTE — Patient Outreach (Signed)
[place] [facility]) Care Management  10/08/2017  [name] [name] 1955/04/27 [id number]   Weekly transition of care call placed to member, no answer.  HIPAA compliant voice message left.  Call then placed to member's sister, [name].  She report he is doing well.  State he usually follows instructions from MD regarding managing his health conditions as long as someone is following him closely.  He continues to have home health, state they use the vest to measure fluid status when visiting in the home.  He had visit with heart failure clinic, sister denies urgent concerns.  This care manager inquired about involvement with paramedicine.  She state member has been involved in the past but refused to continue to work with them.  Member was seen by new primary MD, case worker will continue to provide transportation to appointments, sister will accompany.  Next visit with heart failure clinic 9/26, home visit scheduled for next week.  THN CM Care Plan Problem One     Most Recent Value  Care Plan Problem One  Risk for readmission related to heart failure management as evidenced by recent hospitalization  Role Documenting the Problem One  Care Management [place] for Problem One  Active  [facility] Long Term Goal   Member will not be readmitted to hospital within the next 31 days  THN Long Term Goal Start Date  09/15/17  Interventions for Problem One Long Term Goal  Heart failure zones reviewed with sister, advised of importance of following plan of care to decrease risk of admission  [facility] CM Short Term Goal #3  Member will weigh self daily and record readings over the next 4 weeks  THN CM Short Term Goal #3 Start Date  09/15/17  Interventions for Short Tern Goal #3  Confirmed with sister member now has functional scale, will work on daily weights with him     [name], Therapist, sports, MSN [place] [phone]

## 2017-10-13 ENCOUNTER — Other Ambulatory Visit: Payer: Self-pay | Admitting: *Deleted

## 2017-10-13 NOTE — Patient Outreach (Signed)
[facility]) Care Management   10/13/2017  [name] [name] 1956/01/11 [id number]  [name] [name] is an 62 y.o. male  Subjective:   Member alert and oriented x3, denies any pain or discomfort, denies shortness of breath.  Report taking all medications as instructed.  Has both legs wrapped, report still having visits from [place].  Next appointment with heart failure clinic next week.  Objective:   Review of Systems  Constitutional: Negative.   HENT: Negative.   Eyes: Negative.   Respiratory: Negative.   Cardiovascular: Negative.   Gastrointestinal: Negative.   Genitourinary: Negative.   Musculoskeletal: Negative.   Skin: Negative.   Neurological: Negative.   Endo/Heme/Allergies: Negative.   Psychiatric/Behavioral: Negative.     Physical Exam  Constitutional: He is oriented to person, place, and time. He appears well-developed and well-nourished.  Neck: Normal range of motion.  Cardiovascular: Normal rate, regular rhythm and normal heart sounds.  Respiratory: Effort normal and breath sounds normal.  GI: Soft. Bowel sounds are normal.  Musculoskeletal: Normal range of motion.  Neurological: He is alert and oriented to person, place, and time.  Skin: Skin is warm and dry.   BP 122/68 (BP Location: Right Arm, Patient Position: Sitting, Cuff Size: Normal)   Pulse 91   Resp 18   Wt 288 lb 9.6 oz (130.9 kg)   SpO2 97%   BMI 39.14 kg/m   Encounter Medications:   Outpatient Encounter Medications as of 10/13/2017  Medication Sig  . ACCU-CHEK FASTCLIX LANCETS MISC Check blood sugar 4 times a day before meals and bedtime  . allopurinol (ZYLOPRIM) 100 MG tablet Take 1 tablet (100 mg total) by mouth daily.  . B-D UF III MINI PEN NEEDLES 31G X 5 MM MISC Use four times daily as directed. Dx code E11.00, Z79.4  . Blood Glucose Monitoring Suppl (ACCU-CHEK GUIDE) w/Device KIT 1 each by Does not apply route 4 (four) times daily.  . cariprazine (VRAYLAR)  capsule Take 3 mg by mouth 3 times/day as needed-between meals & bedtime.  . carvedilol (COREG) 3.125 MG tablet Take 1 tablet (3.125 mg total) by mouth 2 (two) times daily with a meal.  . digoxin (LANOXIN) 0.125 MG tablet Take 1 tablet (0.125 mg total) by mouth daily.  Marland Kitchen eplerenone (INSPRA) 25 MG tablet Take 1 tablet (25 mg total) by mouth daily.  Marland Kitchen glucose blood (ACCU-CHEK GUIDE) test strip Check blood sugar 4 times a day before meal and bedtime  . insulin regular (NOVOLIN R,HUMULIN R) 100 units/mL injection Inject 0.1 mLs (10 Units total) into the skin 3 (three) times daily before meals. 3 times with meals.Take 30 minutes before meal.  . metFORMIN (GLUCOPHAGE) 500 MG tablet Take 500 mg by mouth every morning.  . rivaroxaban (XARELTO) 20 MG TABS tablet Take 1 tablet (20 mg total) by mouth daily.  . rosuvastatin (CRESTOR) 40 MG tablet Take 1 tablet (40 mg total) by mouth daily.  . sacubitril-valsartan (ENTRESTO) 24-26 MG Take 1 tablet by mouth 2 (two) times daily.  Marland Kitchen torsemide (DEMADEX) 20 MG tablet Take 4 tablets (80 mg total) by mouth daily.  . Insulin Glargine (LANTUS SOLOSTAR) 100 UNIT/ML Solostar Pen Inject 30 Units into the skin 2 (two) times daily. (Patient taking differently: Inject 30 Units into the skin every morning. )  . QUEtiapine (SEROQUEL) 400 MG tablet Take 1 tablet (400 mg total) by mouth 2 (two) times daily for 21 days. (Patient taking differently: Take 800 mg by mouth at bedtime. )  No facility-administered encounter medications on file as of 10/13/2017.     Functional Status:   In your present state of health, do you have any difficulty performing the following activities: 09/15/2017 09/07/2017  Hearing? N N  Vision? Y N  Difficulty concentrating or making decisions? Y N  Walking or climbing stairs? Y N  Dressing or bathing? Y N  Doing errands, shopping? Y N  Preparing Food and eating ? Y -  Using the Toilet? N -  In the past six months, have you accidently leaked urine?  N -  Do you have problems with loss of bowel control? N -  Managing your Medications? Y -  Managing your Finances? Y -  Housekeeping or managing your Housekeeping? Y -  Some recent data might be hidden    Fall/Depression Screening:    Fall Risk  09/15/2017 10/01/2016 06/23/2016  Falls in the past year? No No No  Number falls in past yr: - - -  Comment - - -  Injury with Fall? - - -  Comment - - -  Risk Factor Category  - - -  Risk for fall due to : - - -  Risk for fall due to: Comment - - -   PHQ 2/9 Scores 09/15/2017 10/01/2016 06/23/2016 05/26/2016 12/30/2015 12/23/2015 09/10/2015  PHQ - 2 Score 0 0 0 0 0 0 0    Assessment:    Met with member at scheduled time.  Assessment complete, no distress noted.  Has not been recording daily weights, but will begin today.  Blood sugars elevated, averages in the 300s for 7, 14, and 30 day.  Admits that he is not always adherent to diet plan.  Discussed possibility of having paramedicine team restart program, declines, does not want to participate.    Denies any urgent concerns, advised to call or have sister call this care manager with questions.    Plan:   Will follow up next week telephonically, will follow up next month with home visit.  THN CM Care Plan Problem One     Most Recent Value  Care Plan Problem One  Risk for readmission related to heart failure management as evidenced by recent hospitalization  Role Documenting the Problem One  Care Management [place] for Problem One  Active  [facility] Long Term Goal   Member will not be readmitted to hospital within the next 31 days  [facility]N Long Term Goal Start Date  09/15/17  Interventions for Problem One Long Term Goal  Member educated on proper diet (diabetic and low salt), medication management, and heart failure zones.    THN CM Short Term Goal #3  Member will weigh self daily and record readings over the next 4 weeks  THN CM Short Term Goal #3 Start Date  09/15/17  Interventions for  Short [place] Goal #3  Member instructed on how to use scale, return demonstration noted.  Educated on how to record on log     [name], Therapist, sports, MSN [place] [phone]

## 2017-10-20 NOTE — Progress Notes (Signed)
Patient ID: [name] [name], male   DOB: Sep 12, 1955, 62 y.o.   MRN: [id number]    Advanced Heart Failure Clinic Note   Primary Care: Internal Medicine clinic, Dr. Silverio Decamp Primary Cardiologist: Dr. Doylene Canard  GI: Dr Ardis Hughs.  Primary HF: Dr. Haroldine Laws   [name] [name] is a 62 y.o. 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 Had history of acute renal failure in setting of rhabdo requiring short term HD.     Please see previous progress note from 08/04/17 for information regarding admissions prior to 07/2017  Admitted 8/13 - 8/19 with A/C CHF. Diuresed with IV lasix and metolazone for a total of 26 lbs down. Restarted on Entresto and Torsemide 80 mg daily for home. Discharge weight 282 lbs.   He presents today for HF follow up. Overall doing really well. He is walking 1.5 blocks back and forth without SOB. No problems with stairs. Orthopnea now resolved. No PND or edema. No CP, dizziness, or palpitations. No bleeding on Xarelto. He has not heard back from sleep study. Limits fluid and salt. No missed medications. Now weighing daily ~296 lbs. No longer has HH.   ECHO 10/27/12 EF 40-45%  ECHO 06/04/15 LVEF 20-25%, decreased diastolic compliance, LAE, RV severely reduced.  ECHO 11/26/15 EF 30%  Echo 12/26/15 EF 30-35% RV ok.  ECHO 05/31/17 EF 25-30% Echo 09/08/17: EF 25-30%  RHC 08/2015 RA = 7 RV = 35/10 PA = 37/15 (25) PCW = 10 Fick cardiac output/index = 4.3/1.7 PVR = 3.5 WU Ao sat = 95% PA sat = 58%, 58% Assessment: 1. Volume status looks good. 2. Cardiac output depressed  2014 LHC -  Left main: Normal LAD: Large vessel gives of 2 diagonals. Normal LCX: Very large vessel with 3 OMs. 20-30% plaque in midsection.  RCA: Dominant. Normal LV-gram done in  the RAO projection: Ejection fraction = 35-40% global HK Assessment: 1. Minimal CAD  Labs 02/12/13 K 4.3 Creatinine 0.98          08/2013: K 3.9, creatinine 1.27, Mag 1.8, pro-BNP 392, Troponin 0.05          06/07/15: K 4.2, creatinine 1.21          07/10/2015: K 3.9 Creatinine 1.16           09/03/2015: K 4.7 Creatinine 1.32          09/11/2015: K 4.1 Creatinine 1.29   SH: Quit smoking 9 years ago. Lives alone. Quit ETOH Oct 09, 2013 FH: Mom died from MI in her early 54s  Brother MI and deceased. Does not know about fathers health history.   Review of systems complete and found to be negative unless listed in HPI.   Past Medical History:  Diagnosis Date  . Atrial fibrillation ([place])   . Cancer [facility])    Prostate cancer-bx. 3 weeks ago  . Cataract   . Chronic combined systolic and diastolic CHF (congestive heart failure) (HCC)    a) EF 40-45% per 2D echo (02/2012) with grade 1 DD b)  NICM c) RHC (04/2012): RA: 4, RV 45/3/4, PA 42/9 (24), PCWP 14, Fick CO/CI: 5.2 /2.2, PVR 1.9 WU, PA 62% and 64% d) ECHO (10/2012) EF 40-45%, grade II DD, RV nl  . CKD (chronic kidney disease) stage 2, GFR 60-89 ml/min    BL  SCr approximately 1-1.3  . Colitis 05/2009   History of colitis of ascending colon noted on CT abd/pelvis (05/2009), with interval resolution with subsequent CT  . Continuous chronic alcoholism ([place])   . Degenerative lumbar spinal stenosis    s/p L2-3, L3-4, L4-5 laminectomy partial facetectomy, and bilateral foraminotomy  . Diabetes mellitus without complication ([place])    Type II  . Dysrhythmia    A. Fib  . Family history of adverse reaction to anesthesia    "sister, can't go to sleep"  . GERD (gastroesophageal reflux disease)   . H/O cocaine abuse   . Hepatic steatosis    suspected 2/2 alcohol abuse  . Hepatitis C   . History of pancreatitis 01/2011   Admission for acute pancreatitis presumed 2/2 ongoing alcohol abuse- and hypertriglyceridemia  . History of pneumonia   . HIT  (heparin-induced thrombocytopenia) ([place])   . Hyperlipidemia   . Hypertension    uncontrolled with medication noncompliance  . Hypertriglyceridemia   . NICM (nonischemic cardiomyopathy) ([place])    a. LHC (04/2012): nl arteries  . PFO (patent foramen ovale) 01/2012   with right to left shunt, noted per TEE in evaluation for source of embolic stroke in 0/6237  . Rhabdomyolysis 02/22/2012   H/O rhabdomyolysis in 01/2012 that was idiopathic, cause never identified  . Schizophrenia, schizo-affective ([place])   . Shortness of breath dyspnea   . Splenic cyst   . Stroke ([place]) 01/2012   Small cerebellar infarcts right greater than left as well as questionable acute left external capsule and caudate nuclear punctate lacunar infarcts noted per MRI (01/2012) - presumed to be embolic likely source PFO with right to left shunt (noted per TEE 01/ 2014)    Current Outpatient Medications  Medication Sig Dispense Refill  . ACCU-CHEK FASTCLIX LANCETS MISC Check blood sugar 4 times a day before meals and bedtime 204 each 5  . allopurinol (ZYLOPRIM) 100 MG tablet Take 1 tablet (100 mg total) by mouth daily. 30 tablet 0  . B-D UF III MINI PEN NEEDLES 31G X 5 MM MISC Use four times daily as directed. Dx code E11.00, Z79.4 100 each 3  . Blood Glucose Monitoring Suppl (ACCU-CHEK GUIDE) w/Device KIT 1 each by Does not apply route 4 (four) times daily. 1 kit 1  . cariprazine (VRAYLAR) capsule Take 3 mg by mouth 3 times/day as needed-between meals & bedtime.    . carvedilol (COREG) 3.125 MG tablet Take 1 tablet (3.125 mg total) by mouth 2 (two) times daily with a meal. 60 tablet 6  . digoxin (LANOXIN) 0.125 MG tablet Take 1 tablet (0.125 mg total) by mouth daily. 30 tablet 0  . eplerenone (INSPRA) 25 MG tablet Take 1 tablet (25 mg total) by mouth daily. 30 tablet 6  . glucose blood (ACCU-CHEK GUIDE) test strip Check blood sugar 4 times a day before meal and bedtime 150 each 5  . Insulin Glargine (LANTUS SOLOSTAR) 100  UNIT/ML Solostar Pen Inject 30 Units into the skin 2 (two) times daily. (Patient taking differently: Inject 30 Units into the skin every morning. ) 18 mL 0  . insulin regular (NOVOLIN R,HUMULIN R) 100 units/mL injection Inject 0.1 mLs (10 Units total) into the skin 3 (three) times daily before meals. 3 times with meals.Take 30 minutes before meal. 15 mL 1  . metFORMIN (GLUCOPHAGE) 500 MG tablet Take 500 mg by mouth every morning.  3  . QUEtiapine (SEROQUEL) 400 MG tablet Take 1 tablet (400 mg total) by mouth  2 (two) times daily for 21 days. (Patient taking differently: Take 800 mg by mouth at bedtime. ) 42 tablet 0  . rivaroxaban (XARELTO) 20 MG TABS tablet Take 1 tablet (20 mg total) by mouth daily. 30 tablet 6  . rosuvastatin (CRESTOR) 40 MG tablet Take 1 tablet (40 mg total) by mouth daily. 90 tablet 3  . sacubitril-valsartan (ENTRESTO) 24-26 MG Take 1 tablet by mouth 2 (two) times daily. 60 tablet 0  . torsemide (DEMADEX) 20 MG tablet Take 4 tablets (80 mg total) by mouth daily. 90 tablet 6   No current facility-administered medications for this encounter.     Allergies  Allergen Reactions  . Heparin Other (See Comments)    Documented HIT under problem list Pt states he's not allergic. " They told me not take it anymore"  . Spironolactone Other (See Comments)    Breast tenderness  . Thorazine [Chlorpromazine Hcl] Other (See Comments)    Body freezes up     Vitals:   10/21/17 1410  BP: 136/86  Pulse: 84  SpO2: 99%  Weight: 134.3 kg (296 lb)   Wt Readings from Last 3 Encounters:  10/21/17 134.3 kg (296 lb)  10/13/17 130.9 kg (288 lb 9.6 oz)  09/21/17 132.5 kg (292 lb 3.2 oz)     PHYSICAL EXAM: General: No resp difficulty. HEENT: Normal Neck: Supple. JVP difficult but does not appear elevated. Carotids 2+ bilat; no bruits. No thyromegaly or nodule noted. Cor: PMI nondisplaced. RRR, No M/G/R noted Lungs: clear, diminished throughout.  Abdomen: Soft, non-tender,  non-distended, no HSM. No bruits or masses. +BS  Extremities: No cyanosis, clubbing, or rash. R and LLE trace edema. BLE unna boots.  Neuro: Alert & orientedx3, cranial nerves grossly intact. moves all 4 extremities w/o difficulty. Affect pleasant   ASSESSMENT /PLAN 1) Chronic combined systolic/diastolic HF: EF 85-02%, grade II DD (10/2012) now decreased to 20-25%  (05/2015) RV severely reduced. Echo 4/19 EF 30-35% RV ok. Had [place] 2014 with minimal CAD. Holter showed <3% PVC burden.  - Echo 05/31/17 EF 25-30%, RV normal - NYHA III. Volume status stable on exam.  - Continue torsemide 80 mg daily.  - Continue eplerenone 25 mg daily (breast tenderness with spiro, now resolved) - Continue carvedilol 3.125 mg BID - Increase Entresto to 49/51 mg BID.  - Continue digoxin 0.125 mg daily - Reinforced fluid restriction to < 2 L daily, sodium restriction to less than 2000 mg daily, and the importance of daily weights.   - Stressed the importance of taking all medications every day. Refuses HF paramedicine.  - Not a candidate for ICD with recurrent wounds. - DC unna boots now that [facility] is no longer coming. He can wear TED hose.   2) HTN - Mildly elevated today. Increase entresto as above. 3) PAF:  - Continue Xarelto 20 mg daily. Denies bleeding.  - Regular on exam 4) ETOH abuse - Remains abstinent.  5) CKD stage II - Creatinine stable 1.24 09/21/17 6) ?OSA - Needs sleep study. Has not heard from them yet. Re-refer today.  7) DMII - Per PCP. 8) Medication Noncompliance - He has been referred to HF Paramedicine program, but declined when called to set up a visit.  9) Frequent PVCs while inpatient - Holter monitor 06/17/17: 3% PVCs.  10) Hyperlipidemia - Continue crestor.   Increase entresto 49/51.  BMET and dig level in 2 weeks. Follow up in 8 weeks Re-refer for sleep study  Georgiana Shore, NP  2:12  PM  Greater than 50% of the 25 minute visit was spent in counseling/coordination of care  regarding disease state education, salt/fluid restriction, sliding scale diuretics, and medication compliance.

## 2017-10-21 ENCOUNTER — Ambulatory Visit (HOSPITAL_COMMUNITY)
Admission: RE | Admit: 2017-10-21 | Discharge: 2017-10-21 | Disposition: A | Discharge status: Home / Self Care | Payer: Medicare Other | Source: Ambulatory Visit | Attending: Cardiology | Admitting: Cardiology

## 2017-10-21 ENCOUNTER — Encounter (HOSPITAL_COMMUNITY): Payer: Self-pay

## 2017-10-21 ENCOUNTER — Other Ambulatory Visit: Payer: Self-pay | Admitting: *Deleted

## 2017-10-21 VITALS — BP 136/86 | HR 84 | Wt 296.0 lb

## 2017-10-21 DIAGNOSIS — I10 Essential (primary) hypertension: Secondary | ICD-10-CM | POA: Diagnosis not present

## 2017-10-21 DIAGNOSIS — Z888 Allergy status to other drugs, medicaments and biological substances status: Secondary | ICD-10-CM | POA: Diagnosis not present

## 2017-10-21 DIAGNOSIS — Z8546 Personal history of malignant neoplasm of prostate: Secondary | ICD-10-CM | POA: Diagnosis not present

## 2017-10-21 DIAGNOSIS — R0683 Snoring: Secondary | ICD-10-CM

## 2017-10-21 DIAGNOSIS — Z79899 Other long term (current) drug therapy: Secondary | ICD-10-CM | POA: Diagnosis not present

## 2017-10-21 DIAGNOSIS — Z794 Long term (current) use of insulin: Secondary | ICD-10-CM | POA: Insufficient documentation

## 2017-10-21 DIAGNOSIS — I130 Hypertensive heart and chronic kidney disease with heart failure and stage 1 through stage 4 chronic kidney disease, or unspecified chronic kidney disease: Secondary | ICD-10-CM | POA: Insufficient documentation

## 2017-10-21 DIAGNOSIS — Z9114 Patient's other noncompliance with medication regimen: Secondary | ICD-10-CM | POA: Diagnosis not present

## 2017-10-21 DIAGNOSIS — Z7901 Long term (current) use of anticoagulants: Secondary | ICD-10-CM | POA: Insufficient documentation

## 2017-10-21 DIAGNOSIS — E1122 Type 2 diabetes mellitus with diabetic chronic kidney disease: Secondary | ICD-10-CM | POA: Diagnosis not present

## 2017-10-21 DIAGNOSIS — F1010 Alcohol abuse, uncomplicated: Secondary | ICD-10-CM | POA: Diagnosis not present

## 2017-10-21 DIAGNOSIS — N182 Chronic kidney disease, stage 2 (mild): Secondary | ICD-10-CM

## 2017-10-21 DIAGNOSIS — Z8673 Personal history of transient ischemic attack (TIA), and cerebral infarction without residual deficits: Secondary | ICD-10-CM | POA: Insufficient documentation

## 2017-10-21 DIAGNOSIS — I480 Paroxysmal atrial fibrillation: Secondary | ICD-10-CM | POA: Diagnosis not present

## 2017-10-21 DIAGNOSIS — Z87891 Personal history of nicotine dependence: Secondary | ICD-10-CM | POA: Diagnosis not present

## 2017-10-21 DIAGNOSIS — E785 Hyperlipidemia, unspecified: Secondary | ICD-10-CM | POA: Insufficient documentation

## 2017-10-21 DIAGNOSIS — F209 Schizophrenia, unspecified: Secondary | ICD-10-CM | POA: Insufficient documentation

## 2017-10-21 DIAGNOSIS — M48061 Spinal stenosis, lumbar region without neurogenic claudication: Secondary | ICD-10-CM | POA: Insufficient documentation

## 2017-10-21 DIAGNOSIS — B1920 Unspecified viral hepatitis C without hepatic coma: Secondary | ICD-10-CM | POA: Diagnosis not present

## 2017-10-21 DIAGNOSIS — I5042 Chronic combined systolic (congestive) and diastolic (congestive) heart failure: Secondary | ICD-10-CM

## 2017-10-21 MED ORDER — SACUBITRIL-VALSARTAN 49-51 MG PO TABS: 1.0000 | ORAL_TABLET | Freq: Two times a day (BID) | ORAL | 3 refills | Status: DC

## 2017-10-21 NOTE — Patient Instructions (Signed)
Increase Entresto 49/51 twice a day  Labs in  2 weeks  remove unna boot and add TED hose  Re-refer sleep study  Follow up in 8 weeks

## 2017-10-21 NOTE — Patient Outreach (Signed)
[facility]) Care Management  10/21/2017  [name] [name] 1955/12/27 [id number]   Weekly transition of care call placed to member, he report he is doing well.  State weight today is 293 pounds, which is up 5 pounds from last week's home visit on 9/18.  He denies any shortness of breath or increased swelling in legs/feet.  Has appointment with heart failure clinic this afternoon, state his caseworker will pick him up at 1pm to provide transportation.  Denies any urgent concerns, will follow up within the next month.  [facility]N CM Care Plan Problem One     Most Recent Value  Care Plan Problem One  Risk for readmission related to heart failure management as evidenced by recent hospitalization  Role Documenting the Problem One  Care Management [name] for Problem One  Not Active  [name] Long Term Goal   Member will not be readmitted to hospital within the next 31 days  THN Long Term Goal Start Date  09/15/17  [name] Long Term Goal Met Date  10/21/17  [name] CM Short Term Goal #3  Member will weigh self daily and record readings over the next 4 weeks  [facility]N CM Short Term Goal #3 Start Date  09/15/17     [name], RN, MSN [name] [phone]

## 2017-10-22 ENCOUNTER — Other Ambulatory Visit: Payer: Self-pay

## 2017-10-22 ENCOUNTER — Observation Stay (HOSPITAL_COMMUNITY)
Admission: EM | Admit: 2017-10-22 | Discharge: 2017-10-24 | Disposition: A | Discharge status: Home / Self Care | Payer: Medicare Other | Attending: Internal Medicine | Admitting: Internal Medicine

## 2017-10-22 ENCOUNTER — Encounter (HOSPITAL_COMMUNITY): Payer: Self-pay | Admitting: Emergency Medicine

## 2017-10-22 ENCOUNTER — Emergency Department (HOSPITAL_COMMUNITY): Payer: Medicare Other

## 2017-10-22 DIAGNOSIS — R531 Weakness: Secondary | ICD-10-CM | POA: Insufficient documentation

## 2017-10-22 DIAGNOSIS — Z79899 Other long term (current) drug therapy: Secondary | ICD-10-CM | POA: Diagnosis not present

## 2017-10-22 DIAGNOSIS — G9389 Other specified disorders of brain: Secondary | ICD-10-CM | POA: Diagnosis not present

## 2017-10-22 DIAGNOSIS — I130 Hypertensive heart and chronic kidney disease with heart failure and stage 1 through stage 4 chronic kidney disease, or unspecified chronic kidney disease: Secondary | ICD-10-CM | POA: Diagnosis not present

## 2017-10-22 DIAGNOSIS — Z23 Encounter for immunization: Secondary | ICD-10-CM | POA: Insufficient documentation

## 2017-10-22 DIAGNOSIS — I480 Paroxysmal atrial fibrillation: Secondary | ICD-10-CM | POA: Insufficient documentation

## 2017-10-22 DIAGNOSIS — Z87891 Personal history of nicotine dependence: Secondary | ICD-10-CM | POA: Diagnosis not present

## 2017-10-22 DIAGNOSIS — Z888 Allergy status to other drugs, medicaments and biological substances status: Secondary | ICD-10-CM | POA: Insufficient documentation

## 2017-10-22 DIAGNOSIS — E860 Dehydration: Secondary | ICD-10-CM

## 2017-10-22 DIAGNOSIS — R55 Syncope and collapse: Principal | ICD-10-CM

## 2017-10-22 DIAGNOSIS — F209 Schizophrenia, unspecified: Secondary | ICD-10-CM | POA: Insufficient documentation

## 2017-10-22 DIAGNOSIS — Y907 Blood alcohol level of 200-239 mg/100 ml: Secondary | ICD-10-CM | POA: Diagnosis not present

## 2017-10-22 DIAGNOSIS — B1920 Unspecified viral hepatitis C without hepatic coma: Secondary | ICD-10-CM | POA: Insufficient documentation

## 2017-10-22 DIAGNOSIS — I5042 Chronic combined systolic (congestive) and diastolic (congestive) heart failure: Secondary | ICD-10-CM | POA: Insufficient documentation

## 2017-10-22 DIAGNOSIS — I10 Essential (primary) hypertension: Secondary | ICD-10-CM | POA: Diagnosis present

## 2017-10-22 DIAGNOSIS — K219 Gastro-esophageal reflux disease without esophagitis: Secondary | ICD-10-CM | POA: Diagnosis not present

## 2017-10-22 DIAGNOSIS — F1010 Alcohol abuse, uncomplicated: Secondary | ICD-10-CM | POA: Diagnosis not present

## 2017-10-22 DIAGNOSIS — R2681 Unsteadiness on feet: Secondary | ICD-10-CM | POA: Diagnosis not present

## 2017-10-22 DIAGNOSIS — Z955 Presence of coronary angioplasty implant and graft: Secondary | ICD-10-CM | POA: Insufficient documentation

## 2017-10-22 DIAGNOSIS — Z7901 Long term (current) use of anticoagulants: Secondary | ICD-10-CM | POA: Diagnosis not present

## 2017-10-22 DIAGNOSIS — I5020 Unspecified systolic (congestive) heart failure: Secondary | ICD-10-CM | POA: Diagnosis present

## 2017-10-22 DIAGNOSIS — G9340 Encephalopathy, unspecified: Secondary | ICD-10-CM | POA: Diagnosis not present

## 2017-10-22 DIAGNOSIS — Z794 Long term (current) use of insulin: Secondary | ICD-10-CM | POA: Insufficient documentation

## 2017-10-22 DIAGNOSIS — N183 Chronic kidney disease, stage 3 (moderate): Secondary | ICD-10-CM | POA: Insufficient documentation

## 2017-10-22 DIAGNOSIS — Z9889 Other specified postprocedural states: Secondary | ICD-10-CM | POA: Insufficient documentation

## 2017-10-22 DIAGNOSIS — Z8673 Personal history of transient ischemic attack (TIA), and cerebral infarction without residual deficits: Secondary | ICD-10-CM | POA: Insufficient documentation

## 2017-10-22 DIAGNOSIS — E781 Pure hyperglyceridemia: Secondary | ICD-10-CM | POA: Diagnosis not present

## 2017-10-22 DIAGNOSIS — I428 Other cardiomyopathies: Secondary | ICD-10-CM | POA: Diagnosis not present

## 2017-10-22 DIAGNOSIS — Z8546 Personal history of malignant neoplasm of prostate: Secondary | ICD-10-CM | POA: Insufficient documentation

## 2017-10-22 DIAGNOSIS — E1122 Type 2 diabetes mellitus with diabetic chronic kidney disease: Secondary | ICD-10-CM | POA: Diagnosis not present

## 2017-10-22 DIAGNOSIS — Z981 Arthrodesis status: Secondary | ICD-10-CM | POA: Insufficient documentation

## 2017-10-22 DIAGNOSIS — I639 Cerebral infarction, unspecified: Secondary | ICD-10-CM | POA: Diagnosis present

## 2017-10-22 DIAGNOSIS — F1490 Cocaine use, unspecified, uncomplicated: Secondary | ICD-10-CM | POA: Diagnosis not present

## 2017-10-22 DIAGNOSIS — Z9079 Acquired absence of other genital organ(s): Secondary | ICD-10-CM | POA: Insufficient documentation

## 2017-10-22 DIAGNOSIS — N182 Chronic kidney disease, stage 2 (mild): Secondary | ICD-10-CM | POA: Diagnosis present

## 2017-10-22 DIAGNOSIS — Z8249 Family history of ischemic heart disease and other diseases of the circulatory system: Secondary | ICD-10-CM | POA: Insufficient documentation

## 2017-10-22 LAB — CBC
HCT: 42.2 % (ref 39.0–52.0)
Hemoglobin: 13.4 g/dL (ref 13.0–17.0)
MCH: 29.5 pg (ref 26.0–34.0)
MCHC: 31.8 g/dL (ref 30.0–36.0)
MCV: 93 fL (ref 78.0–100.0)
Platelets: 174 10*3/uL (ref 150–400)
RBC: 4.54 MIL/uL (ref 4.22–5.81)
RDW: 13.3 % (ref 11.5–15.5)
WBC: 6.8 10*3/uL (ref 4.0–10.5)

## 2017-10-22 LAB — COMPREHENSIVE METABOLIC PANEL
ALBUMIN: 4 g/dL (ref 3.5–5.0)
ALT: 62 U/L — AB (ref 0–44)
AST: 80 U/L — ABNORMAL HIGH (ref 15–41)
Alkaline Phosphatase: 88 U/L (ref 38–126)
Anion gap: 13 (ref 5–15)
BUN: 18 mg/dL (ref 8–23)
CALCIUM: 9.9 mg/dL (ref 8.9–10.3)
CO2: 22 mmol/L (ref 22–32)
CREATININE: 1.33 mg/dL — AB (ref 0.61–1.24)
Chloride: 104 mmol/L (ref 98–111)
GFR calc Af Amer: >60 mL/min (ref >60)
GFR calc non Af Amer: 55 mL/min — ABNORMAL LOW (ref >60)
GLUCOSE: 218 mg/dL — AB (ref 70–99)
Potassium: 3.8 mmol/L (ref 3.5–5.1)
SODIUM: 139 mmol/L (ref 135–145)
Total Bilirubin: 0.5 mg/dL (ref 0.3–1.2)
Total Protein: 7.4 g/dL (ref 6.5–8.1)

## 2017-10-22 LAB — URINALYSIS, ROUTINE W REFLEX MICROSCOPIC
BACTERIA UA: NONE SEEN
BILIRUBIN URINE: NEGATIVE
Glucose, UA: NEGATIVE mg/dL
HGB URINE DIPSTICK: NEGATIVE
Ketones, ur: NEGATIVE mg/dL
LEUKOCYTES UA: NEGATIVE
NITRITE: NEGATIVE
PH: 5 (ref 5.0–8.0)
Protein, ur: 30 mg/dL — AB
SPECIFIC GRAVITY, URINE: 1.009 (ref 1.005–1.030)

## 2017-10-22 LAB — GLUCOSE, CAPILLARY
GLUCOSE-CAPILLARY: 163 mg/dL — AB (ref 70–99)
Glucose-Capillary: 207 mg/dL — ABNORMAL HIGH (ref 70–99)

## 2017-10-22 LAB — ETHANOL: Alcohol, Ethyl (B): 215 mg/dL — ABNORMAL HIGH (ref <10)

## 2017-10-22 LAB — TSH: TSH: 1.682 u[IU]/mL (ref 0.350–4.500)

## 2017-10-22 LAB — I-STAT TROPONIN, ED: Troponin i, poc: 0.2 ng/mL (ref 0.00–0.08)

## 2017-10-22 LAB — I-STAT CG4 LACTIC ACID, ED: Lactic Acid, Venous: 4.96 mmol/L (ref 0.5–1.9)

## 2017-10-22 LAB — LACTIC ACID, PLASMA: LACTIC ACID, VENOUS: 3.4 mmol/L — AB (ref 0.5–1.9)

## 2017-10-22 LAB — TROPONIN I: TROPONIN I: 0.13 ng/mL — AB (ref <0.03)

## 2017-10-22 LAB — CBG MONITORING, ED: Glucose-Capillary: 187 mg/dL — ABNORMAL HIGH (ref 70–99)

## 2017-10-22 LAB — CK: Total CK: 371 U/L (ref 49–397)

## 2017-10-22 LAB — BRAIN NATRIURETIC PEPTIDE: B Natriuretic Peptide: 139.9 pg/mL — ABNORMAL HIGH (ref 0.0–100.0)

## 2017-10-22 MED ORDER — ADULT MULTIVITAMIN W/MINERALS CH
1.0000 | ORAL_TABLET | Freq: Every day | ORAL | Status: DC
  Administered 2017-10-22 – 2017-10-24 (×3): 1 via ORAL
  Filled 2017-10-22 (×3): qty 1

## 2017-10-22 MED ORDER — LORAZEPAM 2 MG/ML IJ SOLN
1.0000 mg | Freq: Four times a day (QID) | INTRAMUSCULAR | Status: DC | PRN
  Administered 2017-10-22: 1 mg via INTRAVENOUS
  Filled 2017-10-22: qty 1

## 2017-10-22 MED ORDER — LORAZEPAM 1 MG PO TABS: 1.0000 mg | ORAL_TABLET | Freq: Four times a day (QID) | ORAL | Status: DC | PRN

## 2017-10-22 MED ORDER — SACUBITRIL-VALSARTAN 49-51 MG PO TABS
1.0000 | ORAL_TABLET | Freq: Two times a day (BID) | ORAL | Status: DC
  Administered 2017-10-22 – 2017-10-24 (×4): 1 via ORAL
  Filled 2017-10-22 (×5): qty 1

## 2017-10-22 MED ORDER — FOLIC ACID 1 MG PO TABS
1.0000 mg | ORAL_TABLET | Freq: Every day | ORAL | Status: DC
  Administered 2017-10-22 – 2017-10-24 (×3): 1 mg via ORAL
  Filled 2017-10-22 (×3): qty 1

## 2017-10-22 MED ORDER — RIVAROXABAN 20 MG PO TABS
20.0000 mg | ORAL_TABLET | Freq: Every day | ORAL | Status: DC
  Administered 2017-10-23 – 2017-10-24 (×2): 20 mg via ORAL
  Filled 2017-10-22 (×2): qty 1

## 2017-10-22 MED ORDER — INSULIN ASPART 100 UNIT/ML ~~LOC~~ SOLN
0.0000 [IU] | Freq: Every day | SUBCUTANEOUS | Status: DC
  Administered 2017-10-23: 2 [IU] via SUBCUTANEOUS

## 2017-10-22 MED ORDER — HYDROCODONE-ACETAMINOPHEN 5-325 MG PO TABS: 1.0000 | ORAL_TABLET | ORAL | Status: DC | PRN

## 2017-10-22 MED ORDER — SPIRONOLACTONE 25 MG PO TABS
25.0000 mg | ORAL_TABLET | Freq: Every day | ORAL | Status: DC
  Administered 2017-10-23: 25 mg via ORAL
  Filled 2017-10-22: qty 1

## 2017-10-22 MED ORDER — INSULIN GLARGINE 100 UNIT/ML ~~LOC~~ SOLN
30.0000 [IU] | Freq: Two times a day (BID) | SUBCUTANEOUS | Status: DC
  Administered 2017-10-22 – 2017-10-24 (×4): 30 [IU] via SUBCUTANEOUS
  Filled 2017-10-22 (×5): qty 0.3

## 2017-10-22 MED ORDER — ACCU-CHEK GUIDE W/DEVICE KIT: 1.0000 | PACK | Freq: Four times a day (QID) | Status: DC

## 2017-10-22 MED ORDER — ONDANSETRON HCL 4 MG PO TABS: 4.0000 mg | ORAL_TABLET | Freq: Four times a day (QID) | ORAL | Status: DC | PRN

## 2017-10-22 MED ORDER — THIAMINE HCL 100 MG/ML IJ SOLN
100.0000 mg | Freq: Every day | INTRAMUSCULAR | Status: DC
  Filled 2017-10-22: qty 2

## 2017-10-22 MED ORDER — DIGOXIN 125 MCG PO TABS
0.1250 mg | ORAL_TABLET | Freq: Every day | ORAL | Status: DC
  Administered 2017-10-23 – 2017-10-24 (×2): 0.125 mg via ORAL
  Filled 2017-10-22 (×2): qty 1

## 2017-10-22 MED ORDER — QUETIAPINE FUMARATE 400 MG PO TABS
400.0000 mg | ORAL_TABLET | Freq: Two times a day (BID) | ORAL | Status: DC
  Administered 2017-10-22 – 2017-10-23 (×2): 400 mg via ORAL
  Filled 2017-10-22 (×2): qty 1

## 2017-10-22 MED ORDER — VITAMIN B-1 100 MG PO TABS
100.0000 mg | ORAL_TABLET | Freq: Every day | ORAL | Status: DC
  Administered 2017-10-22 – 2017-10-24 (×3): 100 mg via ORAL
  Filled 2017-10-22 (×3): qty 1

## 2017-10-22 MED ORDER — SODIUM CHLORIDE 0.9 % IV SOLN
INTRAVENOUS | Status: DC
  Administered 2017-10-22: 19:00:00 via INTRAVENOUS

## 2017-10-22 MED ORDER — BISACODYL 5 MG PO TBEC: 5.0000 mg | DELAYED_RELEASE_TABLET | Freq: Every day | ORAL | Status: DC | PRN

## 2017-10-22 MED ORDER — ROSUVASTATIN CALCIUM 40 MG PO TABS
40.0000 mg | ORAL_TABLET | Freq: Every day | ORAL | Status: DC
  Administered 2017-10-23 – 2017-10-24 (×2): 40 mg via ORAL
  Filled 2017-10-22 (×2): qty 1

## 2017-10-22 MED ORDER — ONDANSETRON 4 MG PO TBDP
4.0000 mg | ORAL_TABLET | Freq: Once | ORAL | Status: AC
  Administered 2017-10-22: 4 mg via ORAL
  Filled 2017-10-22: qty 1

## 2017-10-22 MED ORDER — INSULIN ASPART 100 UNIT/ML ~~LOC~~ SOLN
0.0000 [IU] | Freq: Three times a day (TID) | SUBCUTANEOUS | Status: DC
  Administered 2017-10-22: 3 [IU] via SUBCUTANEOUS
  Administered 2017-10-23: 2 [IU] via SUBCUTANEOUS
  Administered 2017-10-23 (×2): 3 [IU] via SUBCUTANEOUS
  Administered 2017-10-24: 7 [IU] via SUBCUTANEOUS
  Administered 2017-10-24: 5 [IU] via SUBCUTANEOUS
  Administered 2017-10-24: 7 [IU] via SUBCUTANEOUS

## 2017-10-22 MED ORDER — ONDANSETRON HCL 4 MG/2ML IJ SOLN: 4.0000 mg | Freq: Four times a day (QID) | INTRAMUSCULAR | Status: DC | PRN

## 2017-10-22 MED ORDER — ASPIRIN 325 MG PO TABS
325.0000 mg | ORAL_TABLET | Freq: Once | ORAL | Status: AC
  Administered 2017-10-22: 325 mg via ORAL
  Filled 2017-10-22: qty 1

## 2017-10-22 MED ORDER — TORSEMIDE 20 MG PO TABS
80.0000 mg | ORAL_TABLET | Freq: Every day | ORAL | Status: DC
  Administered 2017-10-23 – 2017-10-24 (×2): 80 mg via ORAL
  Filled 2017-10-22 (×2): qty 4

## 2017-10-22 MED ORDER — FLEET ENEMA 7-19 GM/118ML RE ENEM: 1.0000 | ENEMA | Freq: Once | RECTAL | Status: DC | PRN

## 2017-10-22 MED ORDER — CARVEDILOL 3.125 MG PO TABS
3.1250 mg | ORAL_TABLET | Freq: Two times a day (BID) | ORAL | Status: DC
  Administered 2017-10-23 – 2017-10-24 (×4): 3.125 mg via ORAL
  Filled 2017-10-22 (×4): qty 1

## 2017-10-22 MED ORDER — INSULIN ASPART 100 UNIT/ML ~~LOC~~ SOLN
3.0000 [IU] | Freq: Three times a day (TID) | SUBCUTANEOUS | Status: DC
  Administered 2017-10-22 – 2017-10-24 (×6): 3 [IU] via SUBCUTANEOUS

## 2017-10-22 MED ORDER — ACETAMINOPHEN 325 MG PO TABS
650.0000 mg | ORAL_TABLET | Freq: Four times a day (QID) | ORAL | Status: DC | PRN
  Administered 2017-10-23 – 2017-10-24 (×2): 650 mg via ORAL
  Filled 2017-10-22 (×2): qty 2

## 2017-10-22 MED ORDER — ACETAMINOPHEN 650 MG RE SUPP: 650.0000 mg | Freq: Four times a day (QID) | RECTAL | Status: DC | PRN

## 2017-10-22 MED ORDER — SENNOSIDES-DOCUSATE SODIUM 8.6-50 MG PO TABS: 1.0000 | ORAL_TABLET | Freq: Every evening | ORAL | Status: DC | PRN

## 2017-10-22 MED ORDER — ORAL CARE MOUTH RINSE
15.0000 mL | Freq: Two times a day (BID) | OROMUCOSAL | Status: DC
  Administered 2017-10-22 – 2017-10-24 (×2): 15 mL via OROMUCOSAL

## 2017-10-22 MED ORDER — SODIUM CHLORIDE 0.9 % IV BOLUS
1000.0000 mL | Freq: Once | INTRAVENOUS | Status: AC
  Administered 2017-10-22: 1000 mL via INTRAVENOUS

## 2017-10-22 NOTE — ED Provider Notes (Signed)
[facility] Emergency Department Provider Note MRN:  [id number]  Arrival date & time: 10/22/17     Chief Complaint   Loss of Consciousness   History of Present Illness   [name] [name] is a 62 y.o. year-old male with a history of atrial fibrillation, diabetes, CKD, cardiomyopathy presenting to the ED with chief complaint of loss of consciousness.  Patient reports drinking a bottle of vodka today.  Report of syncopal episode at his friend's house.  Patient currently does not remember what happened, endorsing chronic back pain but no other new symptoms today.  I was unable to obtain an accurate HPI, PMH, or ROS due to the patient's intoxication.  Review of Systems  Positive for alcohol use, syncope.  Patient's Health History    Past Medical History:  Diagnosis Date  . Atrial fibrillation ([place])   . Cancer [facility])    Prostate cancer-bx. 3 weeks ago  . Cataract   . Chronic combined systolic and diastolic CHF (congestive heart failure) (HCC)    a) EF 40-45% per 2D echo (02/2012) with grade 1 DD b)  NICM c) RHC (04/2012): RA: 4, RV 45/3/4, PA 42/9 (24), PCWP 14, Fick CO/CI: 5.2 /2.2, PVR 1.9 WU, PA 62% and 64% d) ECHO (10/2012) EF 40-45%, grade II DD, RV nl  . CKD (chronic kidney disease) stage 2, GFR 60-89 ml/min    BL SCr approximately 1-1.3  . Colitis 05/2009   History of colitis of ascending colon noted on CT abd/pelvis (05/2009), with interval resolution with subsequent CT  . Continuous chronic alcoholism ([place])   . Degenerative lumbar spinal stenosis    s/p L2-3, L3-4, L4-5 laminectomy partial facetectomy, and bilateral foraminotomy  . Diabetes mellitus without complication ([place])    Type II  . Dysrhythmia    A. Fib  . Family history of adverse reaction to anesthesia    "sister, can't go to sleep"  . GERD (gastroesophageal reflux disease)   . H/O cocaine abuse   . Hepatic steatosis    suspected 2/2 alcohol abuse  . Hepatitis C   . History of pancreatitis  01/2011   Admission for acute pancreatitis presumed 2/2 ongoing alcohol abuse- and hypertriglyceridemia  . History of pneumonia   . HIT (heparin-induced thrombocytopenia) ([place])   . Hyperlipidemia   . Hypertension    uncontrolled with medication noncompliance  . Hypertriglyceridemia   . NICM (nonischemic cardiomyopathy) ([place])    a. LHC (04/2012): nl arteries  . PFO (patent foramen ovale) 01/2012   with right to left shunt, noted per TEE in evaluation for source of embolic stroke in 05/3662  . Rhabdomyolysis 02/22/2012   H/O rhabdomyolysis in 01/2012 that was idiopathic, cause never identified  . Schizophrenia, schizo-affective ([place])   . Shortness of breath dyspnea   . Splenic cyst   . Stroke ([place]) 01/2012   Small cerebellar infarcts right greater than left as well as questionable acute left external capsule and caudate nuclear punctate lacunar infarcts noted per MRI (01/2012) - presumed to be embolic likely source PFO with right to left shunt (noted per TEE 01/ 2014)    Past Surgical History:  Procedure Laterality Date  . ACHILLES TENDON REPAIR Right 2007   "it was torn"  . BACK SURGERY    . CARDIAC CATHETERIZATION N/A 09/02/2015   Procedure: Right Heart Cath;  Surgeon: Jolaine Artist, MD;  Location: [facility];  Service: Cardiovascular;  Laterality: N/A;  . CATARACT EXTRACTION W/ INTRAOCULAR LENS IMPLANT Right   .  ESOPHAGOGASTRODUODENOSCOPY N/A 06/25/2012   Procedure: ESOPHAGOGASTRODUODENOSCOPY (EGD);  Surgeon: Milus Banister, MD;  Location: [facility];  Service: Endoscopy;  Laterality: N/A;  . I&D EXTREMITY  03/20/2011   Procedure: IRRIGATION AND DEBRIDEMENT EXTREMITY;  Surgeon: Kerin Salen, MD;  Location: [facility];  Service: Orthopedics;  Laterality: Left;  I&D LEFT ACHILLIES TENDON  . KNEE ARTHROSCOPY Left   . LUMBAR LAMINECTOMY     L2-3, L3-4, L4-5 laminectomy, partial facetectomy  . RESECTION DISTAL CLAVICAL  09/17/2011   Procedure: RESECTION DISTAL CLAVICAL;  Surgeon:  Nita Sells, MD;  Location: [facility];  Service: Orthopedics;  Laterality: Right;  right shoulder arthroscopy with sad and open distal clavicle excision   . ROBOT ASSISTED LAPAROSCOPIC RADICAL PROSTATECTOMY N/A 12/16/2012   Procedure: ROBOTIC ASSISTED LAPAROSCOPIC PROSTATECTOMY ;  Surgeon: Ardis Hughs, MD;  Location: [facility];  Service: Urology;  Laterality: N/A;  . TONSILLECTOMY      Family History  Problem Relation Age of Onset  . CAD Mother 69       deceased  . CAD Sister   . CAD Brother 66       died from MI at age 1yo  . Hypertension Unknown     Social History   Socioeconomic History  . Marital status: Single    Spouse name: Not on file  . Number of children: Not on file  . Years of education: 12th grade  . Highest education level: Not on file  Occupational History  . Occupation: Disability    Comment: 2/2 schizophrenia  Social Needs  . Financial resource strain: Not on file  . Food insecurity:    Worry: Not on file    Inability: Not on file  . Transportation needs:    Medical: Not on file    Non-medical: Not on file  Tobacco Use  . Smoking status: Former Smoker    Packs/day: 1.00    Years: 30.00    Pack years: 30.00    Types: Cigarettes    Last attempt to quit: 06/24/2001    Years since quitting: 16.3  . Smokeless tobacco: Never Used  Substance and Sexual Activity  . Alcohol use: Yes    Alcohol/week: 6.0 standard drinks    Types: 6 Glasses of wine per week    Comment: last drink around 8/12  . Drug use: Yes    Types: "Crack" cocaine    Comment: last used cocaine last week  . Sexual activity: Yes  Lifestyle  . Physical activity:    Days per week: Not on file    Minutes per session: Not on file  . Stress: Not on file  Relationships  . Social connections:    Talks on phone: Not on file    Gets together: Not on file    Attends religious service: Not on file    Active member of club or organization: Not on file    Attends  meetings of clubs or organizations: Not on file    Relationship status: Not on file  . Intimate partner violence:    Fear of current or ex partner: Not on file    Emotionally abused: Not on file    Physically abused: Not on file    Forced sexual activity: Not on file  Other Topics Concern  . Not on file  Social History Narrative   Lives in [place] alone, has a [name] aide that helps with medications 4-5 days a week with medications, helping to clean.  Physical Exam  Vital Signs and Nursing Notes reviewed Vitals:   10/22/17 1545 10/22/17 1620  BP:  122/85  Pulse: (!) 102 (!) 101  Resp: (!) 27 (!) 29  SpO2: 97% 94%    CONSTITUTIONAL: Chronically ill-appearing-appearing, disheveled, NAD NEURO:  Alert and oriented x 3, no focal deficits EYES:  eyes equal and reactive ENT/NECK:  no LAD, no JVD CARDIO: Regular rate, well-perfused, normal S1 and S2 PULM:  CTAB no wheezing or rhonchi GI/GU:  normal bowel sounds, non-distended, non-tender MSK/SPINE:  No gross deformities, 2+ pitting edema bilateral lower extremities SKIN:  no rash, atraumatic PSYCH:  Appropriate speech and behavior  Diagnostic and Interventional Summary    EKG Interpretation  Date/Time:  Friday October 22 2017 13:41:00 EDT Ventricular Rate:  104 PR Interval:    QRS Duration: 113 QT Interval:  356 QTC Calculation: 469 R Axis:   170 Text Interpretation:  Sinus or ectopic atrial tachycardia Borderline low voltage, extremity leads Consider anterior infarct Confirmed by Gerlene Fee [phone]) on 10/22/2017 4:17:48 PM      Labs Reviewed  COMPREHENSIVE METABOLIC PANEL - Abnormal; Notable for the following components:      Result Value   Glucose, Bld 218 (*)    Creatinine, Ser 1.33 (*)    AST 80 (*)    ALT 62 (*)    GFR calc non Af Amer 55 (*)    All other components within normal limits  ETHANOL - Abnormal; Notable for the following components:   Alcohol, Ethyl (B) 215 (*)    All other components within  normal limits  BRAIN NATRIURETIC PEPTIDE - Abnormal; Notable for the following components:   B Natriuretic Peptide 139.9 (*)    All other components within normal limits  URINALYSIS, ROUTINE W REFLEX MICROSCOPIC - Abnormal; Notable for the following components:   Protein, ur 30 (*)    All other components within normal limits  I-STAT TROPONIN, ED - Abnormal; Notable for the following components:   Troponin i, poc 0.20 (*)    All other components within normal limits  I-STAT CG4 LACTIC ACID, ED - Abnormal; Notable for the following components:   Lactic Acid, Venous 4.96 (*)    All other components within normal limits  CBG MONITORING, ED - Abnormal; Notable for the following components:   Glucose-Capillary 187 (*)    All other components within normal limits  CBC  CK  LACTIC ACID, PLASMA    DG Chest Port 1 View  Final Result      Medications  sodium chloride 0.9 % bolus 1,000 mL (1,000 mLs Intravenous New Bag/Given 10/22/17 1628)  ondansetron (ZOFRAN-ODT) disintegrating tablet 4 mg (4 mg Oral Given 10/22/17 1427)     Procedures Critical Care  ED Course and Medical Decision Making  I have reviewed the triage vital signs and the nursing notes.  Pertinent labs & imaging results that were available during my care of the patient were reviewed by me and considered in my medical decision making (see below for details).  Unclear syncopal episode in the setting of alcohol use in this 62 year old male with multiple comorbidities.  Cardiac monitoring, labs, reassess when more sober.  Work-up reveals lactic acidosis, elevated troponin.  Favored type II in the setting of dehydration.  EKG with no ischemic changes.  Admitted to hospitalist service for further care.  Barth Kirks. Sedonia Small, [place] [email or web address]  Final Clinical Impressions(s) / ED Diagnoses     ICD-10-CM  1. Dehydration E86.0   2. Syncope R55 DG Chest [facility] 1 View      DG Chest [facility]    ED Discharge Orders    None         Maudie Flakes, MD 10/22/17 [phone]

## 2017-10-22 NOTE — ED Triage Notes (Signed)
Per [facility]: Patient to ED from a friend's place following witnessed syncopal episode - patient reported to have been walking and then collapsed. States he did not hit his head, denies headache or new injuries. ETOH on board - "I drank a bottle of vodka." Patient SpO2 87% RA initially with EMS - increased to 94% with 4L O2 nasal cannula. He denies shortness of breath or chest pain at this time. A&O x 3. Gait unsteady with EMS. Lung sounds clear bilaterally. No obvious signs of injury. Patient does appear intoxicated, but responds to speech and follows commands appropriately. EMS VS: 128/63, HR 110 sinus tach, CBG 252.

## 2017-10-22 NOTE — Progress Notes (Signed)
CRITICAL LAB VALUE TROPONIN: 0.13  On call NP, B[place], notified. Awaiting orders.  Ermalinda Memos, RN

## 2017-10-22 NOTE — H&P (Addendum)
History and Physical    [name] [name] GDJ:[id number] DOB: Jun 15, 1955 DOA: 10/22/2017  PCP: Rogers Blocker, MD Patient coming from: Home  Chief Complaint: Loss of consciousness  HPI: [name] [name] is a 62 y.o. male with medical history significant of systolic congestive heart failure ejection fraction 25-30%, essential hypertension, alcohol use, tobacco use, atrial fibrillation on Xarelto, diabetes mellitus type 2, schizophrenia comes to the hospital for evaluation of loss of consciousness.  Apparently patient was at [address] of his friends [address] earlier today and had lots of vodka to drink but does not remember much.  Apparently he had passed out over there therefore EMS was called.  By the time EMS arrived patient had woken up without any focal neuro deficits but he was brought to the hospital for further evaluation.  Patient denied any chest pain, shortness of breath, lightheadedness, dizziness and other complaints.  Patient does remember much what happened.  The ER he was noted to be slightly tachycardic with heart rate of 105-sinus tachycardia.  His troponins were elevated at 0.2 without any EKG changes.  Lactate was also elevated at 4.96.  His alcohol level was greater than 200.  Medical team was requested to admit the patient given metabolic derangements and elevated troponin level.  When I saw the patient at bedside he was asymptomatic without any complaints.  He denied any chest pain.  Denies any fevers, chills or other complaints. When I was in the room patient was saturating 100% on 4 L nasal cannula therefore I completely turned off the oxygen and he continued to saturate greater than 98%.   Review of Systems: As per HPI otherwise 10 point review of systems negative.  Review of Systems Otherwise negative except as per HPI, including: General: Denies fever, chills, night sweats or unintended weight loss. Resp: Denies cough, wheezing, shortness of breath. Cardiac: Denies chest pain,  palpitations, orthopnea, paroxysmal nocturnal dyspnea. GI: Denies abdominal pain, nausea, vomiting, diarrhea or constipation GU: Denies dysuria, frequency, hesitancy or incontinence MS: Denies muscle aches, joint pain or swelling Neuro: Denies headache, neurologic deficits (focal weakness, numbness, tingling), abnormal gait Psych: Denies anxiety, depression, SI/HI/AVH Skin: Denies new rashes or lesions ID: Denies sick contacts, exotic exposures, travel  Past Medical History:  Diagnosis Date  . Atrial fibrillation ([place])   . Cancer [facility])    Prostate cancer-bx. 3 weeks ago  . Cataract   . Chronic combined systolic and diastolic CHF (congestive heart failure) (HCC)    a) EF 40-45% per 2D echo (02/2012) with grade 1 DD b)  NICM c) RHC (04/2012): RA: 4, RV 45/3/4, PA 42/9 (24), PCWP 14, Fick CO/CI: 5.2 /2.2, PVR 1.9 WU, PA 62% and 64% d) ECHO (10/2012) EF 40-45%, grade II DD, RV nl  . CKD (chronic kidney disease) stage 2, GFR 60-89 ml/min    BL SCr approximately 1-1.3  . Colitis 05/2009   History of colitis of ascending colon noted on CT abd/pelvis (05/2009), with interval resolution with subsequent CT  . Continuous chronic alcoholism ([place])   . Degenerative lumbar spinal stenosis    s/p L2-3, L3-4, L4-5 laminectomy partial facetectomy, and bilateral foraminotomy  . Diabetes mellitus without complication ([place])    Type II  . Dysrhythmia    A. Fib  . Family history of adverse reaction to anesthesia    "sister, can't go to sleep"  . GERD (gastroesophageal reflux disease)   . H/O cocaine abuse   . Hepatic steatosis    suspected 2/2 alcohol abuse  .  Hepatitis C   . History of pancreatitis 01/2011   Admission for acute pancreatitis presumed 2/2 ongoing alcohol abuse- and hypertriglyceridemia  . History of pneumonia   . HIT (heparin-induced thrombocytopenia) ([place])   . Hyperlipidemia   . Hypertension    uncontrolled with medication noncompliance  . Hypertriglyceridemia   . NICM  (nonischemic cardiomyopathy) ([place])    a. LHC (04/2012): nl arteries  . PFO (patent foramen ovale) 01/2012   with right to left shunt, noted per TEE in evaluation for source of embolic stroke in 07/4161  . Rhabdomyolysis 02/22/2012   H/O rhabdomyolysis in 01/2012 that was idiopathic, cause never identified  . Schizophrenia, schizo-affective ([place])   . Shortness of breath dyspnea   . Splenic cyst   . Stroke ([place]) 01/2012   Small cerebellar infarcts right greater than left as well as questionable acute left external capsule and caudate nuclear punctate lacunar infarcts noted per MRI (01/2012) - presumed to be embolic likely source PFO with right to left shunt (noted per TEE 01/ 2014)    Past Surgical History:  Procedure Laterality Date  . ACHILLES TENDON REPAIR Right 2007   "it was torn"  . BACK SURGERY    . CARDIAC CATHETERIZATION N/A 09/02/2015   Procedure: Right Heart Cath;  Surgeon: Jolaine Artist, MD;  Location: [place] [facility];  Service: Cardiovascular;  Laterality: N/A;  . CATARACT EXTRACTION W/ INTRAOCULAR LENS IMPLANT Right   . ESOPHAGOGASTRODUODENOSCOPY N/A 06/25/2012   Procedure: ESOPHAGOGASTRODUODENOSCOPY (EGD);  Surgeon: Milus Banister, MD;  Location: [place];  Service: Endoscopy;  Laterality: N/A;  . I&D EXTREMITY  03/20/2011   Procedure: IRRIGATION AND DEBRIDEMENT EXTREMITY;  Surgeon: Kerin Salen, MD;  Location: [place];  Service: Orthopedics;  Laterality: Left;  I&D LEFT ACHILLIES TENDON  . KNEE ARTHROSCOPY Left   . LUMBAR LAMINECTOMY     L2-3, L3-4, L4-5 laminectomy, partial facetectomy  . RESECTION DISTAL CLAVICAL  09/17/2011   Procedure: RESECTION DISTAL CLAVICAL;  Surgeon: Nita Sells, MD;  Location: [place];  Service: Orthopedics;  Laterality: Right;  right shoulder arthroscopy with sad and open distal clavicle excision   . ROBOT ASSISTED LAPAROSCOPIC RADICAL PROSTATECTOMY N/A 12/16/2012   Procedure: ROBOTIC ASSISTED LAPAROSCOPIC  PROSTATECTOMY ;  Surgeon: Ardis Hughs, MD;  Location: [facility];  Service: Urology;  Laterality: N/A;  . TONSILLECTOMY      SOCIAL HISTORY:  reports that he quit smoking about 16 years ago. His smoking use included cigarettes. He has a 30.00 pack-year smoking history. He has never used smokeless tobacco. He reports that he drinks about 6.0 standard drinks of alcohol per week. He reports that he has current or past drug history. Drug: "Crack" cocaine.  Allergies  Allergen Reactions  . Heparin Other (See Comments)    Documented HIT under problem list Pt states he's not allergic. " They told me not take it anymore"  . Spironolactone Other (See Comments)    Breast tenderness  . Thorazine [Chlorpromazine Hcl] Other (See Comments)    Body freezes up    FAMILY HISTORY: Family History  Problem Relation Age of Onset  . CAD Mother 40       deceased  . CAD Sister   . CAD Brother 32       died from MI at age 46yo  . Hypertension Unknown      Prior to Admission medications   Medication Sig Start Date End Date Taking? Authorizing Provider  ACCU-CHEK FASTCLIX LANCETS [place]  Check blood sugar 4 times a day before meals and bedtime 02/16/17   [name], MD  allopurinol (ZYLOPRIM) 100 MG tablet Take 1 tablet (100 mg total) by mouth daily. 09/14/17   Regalado, Belkys A, MD  B-D UF III MINI PEN NEEDLES 31G X 5 MM MISC Use four times daily as directed. Dx code E11.00, Z79.4 12/23/16   Aldine Contes, MD  Blood Glucose Monitoring Suppl (ACCU-CHEK GUIDE) w/Device KIT 1 each by Does not apply route 4 (four) times daily. 02/16/17   [name], MD  cariprazine (VRAYLAR) capsule Take 3 mg by mouth 3 times/day as needed-between meals & bedtime.    [provider]  carvedilol (COREG) 3.125 MG tablet Take 1 tablet (3.125 mg total) by mouth 2 (two) times daily with a meal. 06/03/17   Tillery, Satira Mccallum, PA-C  digoxin (LANOXIN) 0.125 MG tablet Take 1 tablet (0.125 mg total) by mouth  daily. 09/14/17   Regalado, Belkys A, MD  eplerenone (INSPRA) 25 MG tablet Take 1 tablet (25 mg total) by mouth daily. 06/23/17   Bensimhon, Shaune Pascal, MD  glucose blood (ACCU-CHEK GUIDE) test strip Check blood sugar 4 times a day before meal and bedtime 02/16/17   [name], MD  Insulin Glargine (LANTUS SOLOSTAR) 100 UNIT/ML Solostar Pen Inject 30 Units into the skin 2 (two) times daily. Patient taking differently: Inject 30 Units into the skin every morning.  06/03/17 10/21/17  Elwin Mocha, MD  insulin regular (NOVOLIN R,HUMULIN R) 100 units/mL injection Inject 0.1 mLs (10 Units total) into the skin 3 (three) times daily before meals. 3 times with meals.Take 30 minutes before meal. 09/13/17   Regalado, Belkys A, MD  metFORMIN (GLUCOPHAGE) 500 MG tablet Take 500 mg by mouth every morning. 07/05/17   [provider]  QUEtiapine (SEROQUEL) 400 MG tablet Take 1 tablet (400 mg total) by mouth 2 (two) times daily for 21 days. Patient taking differently: Take 800 mg by mouth at bedtime.  06/03/17 10/21/17  Elwin Mocha, MD  rivaroxaban (XARELTO) 20 MG TABS tablet Take 1 tablet (20 mg total) by mouth daily. 06/04/17   Shirley Friar, PA-C  rosuvastatin (CRESTOR) 40 MG tablet Take 1 tablet (40 mg total) by mouth daily. 07/07/17   Georgiana Shore, NP  sacubitril-valsartan (ENTRESTO) 49-51 MG Take 1 tablet by mouth 2 (two) times daily. 10/21/17   Georgiana Shore, NP  torsemide (DEMADEX) 20 MG tablet Take 4 tablets (80 mg total) by mouth daily. 09/13/17   Niel Hummer A, MD    Physical Exam: Vitals:   10/22/17 1500 10/22/17 1530 10/22/17 1545 10/22/17 1620  BP:    122/85  Pulse: (!) 102 (!) 101 (!) 102 (!) 101  Resp: (!) 25 (!) 26 (!) 27 (!) 29  SpO2: 96% 97% 97% 94%      Constitutional: NAD, calm, comfortable, disheveled Eyes: PERRL, lids and conjunctivae normal ENMT: Mucous membranes are moist. Posterior pharynx clear of any exudate or lesions.Normal dentition.  Neck:  normal, supple, no masses, no thyromegaly Respiratory: Diminished breath sounds at the bases Cardiovascular: Tachycardia, regular rate and rhythm, no murmurs / rubs / gallops. No extremity edema. 2+ pedal pulses. No carotid bruits.  1+ bilateral lower extremity pitting edema Abdomen: no tenderness, no masses palpated. No hepatosplenomegaly. Bowel sounds positive.  Musculoskeletal: no clubbing / cyanosis. No joint deformity upper and lower extremities. Good ROM, no contractures. Normal muscle tone.  Skin: no rashes, lesions, ulcers. No induration Neurologic: CN 2-12 grossly  intact. Sensation intact, DTR normal. Strength 5/5 in all 4.  Psychiatric: Normal judgment and insight. Alert and oriented x 3. Normal mood.     Labs on Admission: I have personally reviewed following labs and imaging studies  CBC: Recent Labs  Lab 10/22/17 1403  WBC 6.8  HGB 13.4  HCT 42.2  MCV 93.0  PLT 381   Basic Metabolic Panel: Recent Labs  Lab 10/22/17 1403  NA 139  K 3.8  CL 104  CO2 22  GLUCOSE 218*  BUN 18  CREATININE 1.33*  CALCIUM 9.9   GFR: Estimated Creatinine Clearance: 80.7 mL/min (A) (by C-G formula based on SCr of 1.33 mg/dL (H)). Liver Function Tests: Recent Labs  Lab 10/22/17 1403  AST 80*  ALT 62*  ALKPHOS 88  BILITOT 0.5  PROT 7.4  ALBUMIN 4.0   No results for input(s): LIPASE, AMYLASE in the last 168 hours. No results for input(s): AMMONIA in the last 168 hours. Coagulation Profile: No results for input(s): INR, PROTIME in the last 168 hours. Cardiac Enzymes: No results for input(s): CKTOTAL, CKMB, CKMBINDEX, TROPONINI in the last 168 hours. BNP (last 3 results) No results for input(s): PROBNP in the last 8760 hours. HbA1C: No results for input(s): HGBA1C in the last 72 hours. CBG: Recent Labs  Lab 10/22/17 1410  GLUCAP 187*   Lipid Profile: No results for input(s): CHOL, HDL, LDLCALC, TRIG, CHOLHDL, LDLDIRECT in the last 72 hours. Thyroid Function Tests: No  results for input(s): TSH, T4TOTAL, FREET4, T3FREE, THYROIDAB in the last 72 hours. Anemia Panel: No results for input(s): VITAMINB12, FOLATE, FERRITIN, TIBC, IRON, RETICCTPCT in the last 72 hours. Urine analysis:    Component Value Date/Time   COLORURINE YELLOW 10/22/2017 [place] 10/22/2017 1403   LABSPEC 1.009 10/22/2017 1403   PHURINE 5.0 10/22/2017 1403   GLUCOSEU NEGATIVE 10/22/2017 1403   HGBUR NEGATIVE 10/22/2017 1403   BILIRUBINUR NEGATIVE 10/22/2017 1403   KETONESUR NEGATIVE 10/22/2017 1403   PROTEINUR 30 (A) 10/22/2017 1403   UROBILINOGEN 1.0 05/17/2014 1925   NITRITE NEGATIVE 10/22/2017 1403   LEUKOCYTESUR NEGATIVE 10/22/2017 1403   Sepsis Labs: !!!!!!!!!!!!!!!!!!!!!!!!!!!!!!!!!!!!!!!!!!!! _0 (procalcitonin:4,lacticidven:4) )No results found for this or any previous visit (from the past 240 hour(s)).   Radiological Exams on Admission: Dg Chest Port 1 View  Result Date: 10/22/2017 CLINICAL DATA:  Shortness of breath and cough for 2 days EXAM: PORTABLE CHEST 1 VIEW COMPARISON:  09/07/2017 FINDINGS: Cardiac shadow is enlarged but accentuated by the portable technique. The overall inspiratory effort is poor although no focal infiltrate or sizable effusion is seen. No bony abnormality is noted. IMPRESSION: Cardiomegaly stable from the prior exam. No acute abnormality noted. Electronically Signed   By: Inez Catalina M.D.   On: 10/22/2017 14:09     All images have been reviewed by me personally.  EKG: Independently reviewed.  Sinus tachycardia heart rate 103  Assessment/Plan Principal Problem:   Syncope, vasovagal Active Problems:   HTN (hypertension)   Generalized weakness   Alcohol abuse   Schizophrenia ([place])   Heart failure with reduced ejection fraction, NYHA class III (HCC)   Paroxysmal atrial fibrillation (HCC)   CKD (chronic kidney disease), stage III (HCC)   Syncope   Loss of consciousness, unclear etiology Alcohol intoxication with  frequent alcohol use - Admit the patient for observation. - EKG does not show any acute ST-T changes.  Currently he is chest pain-free.  Troponin elevated at 0.2, will trend this -We will check CK levels  to ensure he is does not have rhabdomyolysis. -Lactate is slightly elevated therefore getting gentle hydration with caution especially in the setting of CHF.  CHF does not seem to be active at the moment. - Alcohol withdrawal protocol is currently in place. -Patient has no new focal neurologic sign and his mentation is baseline therefore we will hold off on obtaining CT of the head anymore.  But if necessary we will get a stat as he is on Xarelto at home.  Elevated troponin -Troponin is 0.2 without any evidence of EKG changes or chest pain.  Patient is already on anticoagulation at home Xarelto which she will continue.  Will order aspirin 325 mg once -Checking CK and TSH level.  Elevated lactate -Suspect from poor perfusion dehydration.  No evidence of fevers leukocytosis or other infection.  Holding off on antibiotic use.  Will trend lactate and gently hydrate him especially in the setting of systolic CHF.  Currently appears to be euvolemic.  History of chronic systolic congestive heart failure, class II; ef 30% - Chest x-ray shows cardiomegaly but no evidence of fluid overload.  BNP is slightly elevated.  We will plan on resuming home medications-Coreg 2.125 mg twice daily, Entresto, Aldactone Last echocardiogram in August 2019 showed ejection fraction 25-30%  History of atrial fibrillation -Resume home medications including Coreg and digoxin.  Continue Xarelto.  Diabetes mellitus type 2 -Continue Lantus 30 units twice daily.  Insulin sliding scale ordered.  CKD stage II -This appears to be at baseline of around 1.6.  Essential hypertension -Resume home medications.  DVT prophylaxis: On Xarelto at home Code Status: Full code Family Communication: None at bedside Disposition Plan:  Hopefully can be discharged in next 24 hours Consults called: None Admission status: Telemetry admission for observation   Time Spent: 65 minutes.  >50% of the time was devoted to discussing the patients care, assessment, plan and disposition with other care givers along with counseling the patient about the risks and benefits of treatment.   Please Note: This patient record was dictated using Editor, commissioning. Chart creation errors have been sought, but may not always have been located. Such creation errors do not reflect on the Standard of Medical Care.   Ankit Arsenio Loader MD [facility] Pager [phone]  If 7PM-7AM, please contact night-coverage [email or web address] Password [facility]  10/22/2017, 4:53 PM

## 2017-10-22 NOTE — Progress Notes (Signed)
CRITICAL LAB VALUE LACTIC ACID: 3.4  On call NP, [place], notified. Awaiting orders.  Ermalinda Memos, RN

## 2017-10-22 NOTE — Progress Notes (Addendum)
Patient admitted to [facility], unsteady on feet, during assessment, RN found a small bottle of crown royal gin in patient pocket along with $36 in cash, the RN for night  [place] has the money and will sent it to security. Crown royal was pull in the sink and bottle thrown in the trash. Patient is confuse, two RNs sign form to take money to security.

## 2017-10-22 NOTE — ED Notes (Addendum)
Admitting at bedside. Provided ice water and graham crackers per MD okay.

## 2017-10-22 NOTE — ED Notes (Signed)
Main lab called to add on CK.

## 2017-10-22 NOTE — ED Notes (Signed)
EDP notified of elevated Trop.

## 2017-10-23 ENCOUNTER — Encounter (HOSPITAL_COMMUNITY): Payer: Self-pay | Admitting: Internal Medicine

## 2017-10-23 DIAGNOSIS — N183 Chronic kidney disease, stage 3 (moderate): Secondary | ICD-10-CM | POA: Diagnosis not present

## 2017-10-23 DIAGNOSIS — F1010 Alcohol abuse, uncomplicated: Secondary | ICD-10-CM | POA: Diagnosis not present

## 2017-10-23 DIAGNOSIS — R55 Syncope and collapse: Secondary | ICD-10-CM | POA: Diagnosis not present

## 2017-10-23 DIAGNOSIS — F259 Schizoaffective disorder, unspecified: Secondary | ICD-10-CM | POA: Insufficient documentation

## 2017-10-23 DIAGNOSIS — F250 Schizoaffective disorder, bipolar type: Secondary | ICD-10-CM

## 2017-10-23 DIAGNOSIS — F1410 Cocaine abuse, uncomplicated: Secondary | ICD-10-CM

## 2017-10-23 LAB — COMPREHENSIVE METABOLIC PANEL
ALK PHOS: 89 U/L (ref 38–126)
ALT: 53 U/L — ABNORMAL HIGH (ref 0–44)
AST: 50 U/L — ABNORMAL HIGH (ref 15–41)
Albumin: 3.9 g/dL (ref 3.5–5.0)
Anion gap: 9 (ref 5–15)
BILIRUBIN TOTAL: 0.5 mg/dL (ref 0.3–1.2)
BUN: 16 mg/dL (ref 8–23)
CALCIUM: 9.3 mg/dL (ref 8.9–10.3)
CO2: 27 mmol/L (ref 22–32)
Chloride: 105 mmol/L (ref 98–111)
Creatinine, Ser: 1.14 mg/dL (ref 0.61–1.24)
GFR calc Af Amer: >60 mL/min (ref >60)
GLUCOSE: 173 mg/dL — AB (ref 70–99)
Potassium: 4.5 mmol/L (ref 3.5–5.1)
Sodium: 141 mmol/L (ref 135–145)
TOTAL PROTEIN: 7.4 g/dL (ref 6.5–8.1)

## 2017-10-23 LAB — GLUCOSE, CAPILLARY
Glucose-Capillary: 156 mg/dL — ABNORMAL HIGH (ref 70–99)
Glucose-Capillary: 228 mg/dL — ABNORMAL HIGH (ref 70–99)
Glucose-Capillary: 230 mg/dL — ABNORMAL HIGH (ref 70–99)
Glucose-Capillary: 236 mg/dL — ABNORMAL HIGH (ref 70–99)

## 2017-10-23 LAB — BLOOD GAS, ARTERIAL
Acid-Base Excess: 5.5 mmol/L — ABNORMAL HIGH (ref 0.0–2.0)
Bicarbonate: 30.5 mmol/L — ABNORMAL HIGH (ref 20.0–28.0)
Drawn by: 244801
FIO2: 0.21
O2 Saturation: 88.3 %
PATIENT TEMPERATURE: 98.6
pCO2 arterial: 53.6 mmHg — ABNORMAL HIGH (ref 32.0–48.0)
pH, Arterial: 7.374 (ref 7.350–7.450)
pO2, Arterial: 56.4 mmHg — ABNORMAL LOW (ref 83.0–108.0)

## 2017-10-23 LAB — CBC
HCT: 42 % (ref 39.0–52.0)
Hemoglobin: 12.8 g/dL — ABNORMAL LOW (ref 13.0–17.0)
MCH: 28.6 pg (ref 26.0–34.0)
MCHC: 30.5 g/dL (ref 30.0–36.0)
MCV: 94 fL (ref 78.0–100.0)
PLATELETS: 150 10*3/uL (ref 150–400)
RBC: 4.47 MIL/uL (ref 4.22–5.81)
RDW: 13.7 % (ref 11.5–15.5)
WBC: 6.4 10*3/uL (ref 4.0–10.5)

## 2017-10-23 LAB — DIGOXIN LEVEL: DIGOXIN LVL: 0.3 ng/mL — AB (ref 0.8–2.0)

## 2017-10-23 LAB — PROTIME-INR
INR: 1.16
PROTHROMBIN TIME: 14.7 s (ref 11.4–15.2)

## 2017-10-23 LAB — RAPID URINE DRUG SCREEN, HOSP PERFORMED
Amphetamines: NOT DETECTED
BARBITURATES: NOT DETECTED
BENZODIAZEPINES: NOT DETECTED
COCAINE: POSITIVE — AB
Opiates: NOT DETECTED
Tetrahydrocannabinol: NOT DETECTED

## 2017-10-23 LAB — APTT: APTT: 31 s (ref 24–36)

## 2017-10-23 LAB — HIV ANTIBODY (ROUTINE TESTING W REFLEX): HIV SCREEN 4TH GENERATION: NONREACTIVE

## 2017-10-23 LAB — LACTIC ACID, PLASMA: Lactic Acid, Venous: 1.4 mmol/L (ref 0.5–1.9)

## 2017-10-23 LAB — TROPONIN I: TROPONIN I: 0.13 ng/mL — AB (ref <0.03)

## 2017-10-23 LAB — MAGNESIUM: Magnesium: 1.9 mg/dL (ref 1.7–2.4)

## 2017-10-23 MED ORDER — QUETIAPINE FUMARATE 300 MG PO TABS
300.0000 mg | ORAL_TABLET | Freq: Two times a day (BID) | ORAL | Status: DC
  Filled 2017-10-23: qty 1

## 2017-10-23 MED ORDER — SPIRONOLACTONE 25 MG PO TABS: 25.0000 mg | ORAL_TABLET | Freq: Every day | ORAL | Status: DC

## 2017-10-23 MED ORDER — EPLERENONE 25 MG PO TABS
25.0000 mg | ORAL_TABLET | Freq: Every day | ORAL | Status: DC
  Administered 2017-10-24: 25 mg via ORAL
  Filled 2017-10-23: qty 1

## 2017-10-23 MED ORDER — INFLUENZA VAC SPLIT QUAD 0.5 ML IM SUSY
0.5000 mL | PREFILLED_SYRINGE | INTRAMUSCULAR | Status: AC
  Administered 2017-10-24: 0.5 mL via INTRAMUSCULAR
  Filled 2017-10-23: qty 0.5

## 2017-10-23 MED ORDER — HYDROCODONE-ACETAMINOPHEN 5-325 MG PO TABS
1.0000 | ORAL_TABLET | Freq: Four times a day (QID) | ORAL | Status: DC | PRN
  Administered 2017-10-24: 1 via ORAL
  Filled 2017-10-23: qty 1

## 2017-10-23 MED ORDER — QUETIAPINE FUMARATE 100 MG PO TABS
300.0000 mg | ORAL_TABLET | Freq: Two times a day (BID) | ORAL | Status: DC
  Administered 2017-10-23: 300 mg via ORAL
  Filled 2017-10-23 (×3): qty 3
  Filled 2017-10-23 (×2): qty 12
  Filled 2017-10-23 (×2): qty 3

## 2017-10-23 MED ORDER — QUETIAPINE FUMARATE 300 MG PO TABS: 300.0000 mg | ORAL_TABLET | Freq: Two times a day (BID) | ORAL | Status: DC

## 2017-10-23 NOTE — Progress Notes (Addendum)
[facility] PROGRESS NOTE  [name] [name] [id number]:[id number] DOB: 1955-05-23 DOA: 10/22/2017 PCP: Rogers Blocker, MD  Assessment/Plan: Loss of consciousness/acute encephalopathy. Improving but remains lethargic and complains of "dizzy".  Likely related to etoh intoxication but does have hx of schizophrenia and home meds include vraylar and seroquel. etoh level 200. EKG does not show any acute ST-T changes.  Troponin elevated at 0.13 but flat. Chart review indicates gradual trending up of troponin since 04/2017. CK 359. Lactate within normal limits today. No s/sx of infection. No metabolic derangement. Home meds include seraquel 400mg  BID. Oxygen saturation level dips briefly with exertion. -oxygen supplementation -obtain ABG -obtain dig level -reduce seraquel dose -consider CT head if no improvement  Elevated troponin -Troponin is 0.13 without any evidence of EKG changes or chest pain.  Patient is already on anticoagulation at home Xarelto.  -see #1 -CK 368m TSH 1.6 -no chest pain.  Elevated lactate -Suspect from poor perfusion dehydration.  No evidence of fevers leukocytosis or other infection. Resolved this am  History of chronic systolic congestive heart failure, class II; ef 30% - Chest x-ray shows cardiomegaly but no evidence of fluid overload.  BNP is slightly elevated.   -Coreg 2.125 mg twice daily -continue home Entresto, Aldactone -Last echocardiogram in August 2019 showed ejection fraction 25-30%  History of atrial fibrillation -only fair rate control.  home medications including Coreg and digoxin.  Continue Xarelto. -obtain dig level -continue home med  Diabetes mellitus type 2 -poor control. A1C 8.3 8/19. Improved from 14.7 4/19. -Continue Lantus 30 units twice daily.  Insulin sliding scale ordered.  CKD stage II -This appears to be at baseline of around 1.6. -current creatinine better than baseline.  -monitor urine output -hold nephrotoxins as  able  Essential hypertension -fair control. Home meds include inspra, entresto -continue home medications.  Schizophrenia -appears stable at baseline -see #1.  -some concern home meds contributing to #1 -monitor  Code Status: full Family Communication: none present Disposition Plan: home   Consultants:  none  Procedures:  none  Antibiotics:  none  HPI/Subjective: [name] [name] is a 62 y.o. male with medical history significant of systolic congestive heart failure ejection fraction 25-30%, essential hypertension, alcohol use, tobacco use, atrial fibrillation on Xarelto, diabetes mellitus type 2, schizophrenia came to the hospital 9/27 for evaluation of loss of consciousness.  Apparently patient was at  friends place,had lots of vodka.  Apparently he had passed out over there therefore EMS was called.  By the time EMS arrived patient had woken up without any focal neuro deficits but he was brought to the hospital for further evaluation.  Patient denied any chest pain, shortness of breath, lightheadedness, dizziness and other complaints.  Patient does remember much what happened.   Objective: Vitals:   10/23/17 1255 10/23/17 1400  BP:    Pulse:    Resp:    Temp:    SpO2: 94% 93%    Intake/Output Summary (Last 24 hours) at 10/23/2017 1616 Last data filed at 10/23/2017 1300 Gross per 24 hour  Intake 240 ml  Output 3450 ml  Net -3210 ml   Filed Weights   10/23/17 0605  Weight: 132.9 kg    Exam:   General:  Sitting in chair head down eyes closed, awakens to verbal stimuli. No acute distress  Cardiovascular: rrr, no mgr 1+ LE edema bilateraly  Respiratory: normal effort somewhat shallow with distant breath sounds. No wheeze no rhonchi  Abdomen: obese soft +BS non-tender to palpation.  No guarding or rebounding  Musculoskeletal: joints without swelling/erythema.    Data Reviewed: Basic Metabolic Panel: Recent Labs  Lab 10/22/17 1403 10/23/17 0118   NA 139 141  K 3.8 4.5  CL 104 105  CO2 22 27  GLUCOSE 218* 173*  BUN 18 16  CREATININE 1.33* 1.14  CALCIUM 9.9 9.3  MG  --  1.9   Liver Function Tests: Recent Labs  Lab 10/22/17 1403 10/23/17 0118  AST 80* 50*  ALT 62* 53*  ALKPHOS 88 89  BILITOT 0.5 0.5  PROT 7.4 7.4  ALBUMIN 4.0 3.9   No results for input(s): LIPASE, AMYLASE in the last 168 hours. No results for input(s): AMMONIA in the last 168 hours. CBC: Recent Labs  Lab 10/22/17 1403 10/23/17 0118  WBC 6.8 6.4  HGB 13.4 12.8*  HCT 42.2 42.0  MCV 93.0 94.0  PLT 174 150   Cardiac Enzymes: Recent Labs  Lab 10/22/17 1618 10/22/17 1848 10/23/17 0118  CKTOTAL 371  --   --   TROPONINI  --  0.13* 0.13*   BNP (last 3 results) Recent Labs    09/07/17 0842 09/21/17 1214 10/22/17 1403  BNP 177.7* 99.1 139.9*    ProBNP (last 3 results) No results for input(s): PROBNP in the last 8760 hours.  CBG: Recent Labs  Lab 10/22/17 1410 10/22/17 1834 10/22/17 2058 10/23/17 0834 10/23/17 1222  GLUCAP 187* 207* 163* 156* 228*    No results found for this or any previous visit (from the past 240 hour(s)).   Studies: Dg Chest Port 1 View  Result Date: 10/22/2017 CLINICAL DATA:  Shortness of breath and cough for 2 days EXAM: PORTABLE CHEST 1 VIEW COMPARISON:  09/07/2017 FINDINGS: Cardiac shadow is enlarged but accentuated by the portable technique. The overall inspiratory effort is poor although no focal infiltrate or sizable effusion is seen. No bony abnormality is noted. IMPRESSION: Cardiomegaly stable from the prior exam. No acute abnormality noted. Electronically Signed   By: Inez Catalina M.D.   On: 10/22/2017 14:09    Scheduled Meds: . carvedilol  3.125 mg Oral BID WC  . digoxin  0.125 mg Oral Daily  . [START ON 10/24/2017] eplerenone  25 mg Oral Daily  . folic acid  1 mg Oral Daily  . [START ON 10/24/2017] Influenza vac split quadrivalent PF  0.5 mL Intramuscular Tomorrow-1000  . insulin aspart  0-5  Units Subcutaneous QHS  . insulin aspart  0-9 Units Subcutaneous TID WC  . insulin aspart  3 Units Subcutaneous TID WC  . insulin glargine  30 Units Subcutaneous BID  . mouth rinse  15 mL Mouth Rinse BID  . multivitamin with minerals  1 tablet Oral Daily  . QUEtiapine  300 mg Oral BID  . rivaroxaban  20 mg Oral Daily  . rosuvastatin  40 mg Oral Daily  . sacubitril-valsartan  1 tablet Oral BID  . thiamine  100 mg Oral Daily   Or  . thiamine  100 mg Intravenous Daily  . torsemide  80 mg Oral Daily   Continuous Infusions:  Principal Problem:   Syncope, vasovagal Active Problems:   Generalized weakness   Acute encephalopathy   HTN (hypertension)   Alcohol abuse   Schizophrenia ([place])   Heart failure with reduced ejection fraction, NYHA class III (HCC)   CKD (chronic kidney disease), stage III (HCC)   Paroxysmal atrial fibrillation ([place])    Time spent: 35 minutes    [facility] M  [facility] If 7PM-7AM,  please contact night-coverage at [email or web address], password [facility] 10/23/2017, 4:16 PM  LOS: 0 days

## 2017-10-23 NOTE — Progress Notes (Signed)
Per CCMD pt had 5 beats of wide QRS  Pt non symptomatic  Vitals documented in flow sheets  FYI text sent to MD

## 2017-10-24 ENCOUNTER — Observation Stay (HOSPITAL_COMMUNITY): Payer: Medicare Other

## 2017-10-24 ENCOUNTER — Encounter (HOSPITAL_COMMUNITY): Payer: Self-pay | Admitting: Radiology

## 2017-10-24 DIAGNOSIS — R55 Syncope and collapse: Secondary | ICD-10-CM | POA: Diagnosis not present

## 2017-10-24 DIAGNOSIS — I480 Paroxysmal atrial fibrillation: Secondary | ICD-10-CM | POA: Diagnosis not present

## 2017-10-24 DIAGNOSIS — N183 Chronic kidney disease, stage 3 (moderate): Secondary | ICD-10-CM | POA: Diagnosis not present

## 2017-10-24 DIAGNOSIS — W19XXXA Unspecified fall, initial encounter: Secondary | ICD-10-CM

## 2017-10-24 DIAGNOSIS — F209 Schizophrenia, unspecified: Secondary | ICD-10-CM | POA: Diagnosis not present

## 2017-10-24 DIAGNOSIS — F1010 Alcohol abuse, uncomplicated: Secondary | ICD-10-CM | POA: Diagnosis not present

## 2017-10-24 LAB — GLUCOSE, CAPILLARY
GLUCOSE-CAPILLARY: 310 mg/dL — AB (ref 70–99)
Glucose-Capillary: 254 mg/dL — ABNORMAL HIGH (ref 70–99)
Glucose-Capillary: 302 mg/dL — ABNORMAL HIGH (ref 70–99)

## 2017-10-24 LAB — CBC
HCT: 46 % (ref 39.0–52.0)
Hemoglobin: 14.1 g/dL (ref 13.0–17.0)
MCH: 29.1 pg (ref 26.0–34.0)
MCHC: 30.7 g/dL (ref 30.0–36.0)
MCV: 94.8 fL (ref 78.0–100.0)
PLATELETS: 136 10*3/uL — AB (ref 150–400)
RBC: 4.85 MIL/uL (ref 4.22–5.81)
RDW: 13.3 % (ref 11.5–15.5)
WBC: 5.9 10*3/uL (ref 4.0–10.5)

## 2017-10-24 LAB — BASIC METABOLIC PANEL
ANION GAP: 9 (ref 5–15)
BUN: 15 mg/dL (ref 8–23)
CALCIUM: 9.6 mg/dL (ref 8.9–10.3)
CO2: 29 mmol/L (ref 22–32)
CREATININE: 1.03 mg/dL (ref 0.61–1.24)
Chloride: 99 mmol/L (ref 98–111)
GLUCOSE: 265 mg/dL — AB (ref 70–99)
Potassium: 4.5 mmol/L (ref 3.5–5.1)
Sodium: 137 mmol/L (ref 135–145)

## 2017-10-24 NOTE — Progress Notes (Addendum)
PT Cancellation Note  Patient Details Name: [name] [name] MRN: [id number] DOB: 1955-02-21   Cancelled Treatment:    Reason Eval/Treat Not Completed: Other (comment)   MRI completed but report not yet available. Await report due to ?CVA prior to PT evaluation.  Will return later today.   Jeanie Cooks Vashon Arch, PT 10/24/2017, 9:59 AM  Pager [phone]

## 2017-10-24 NOTE — Evaluation (Signed)
Physical Therapy Evaluation and Discharge Patient Details Name: [name] [name] MRN: [id number] DOB: 1955/07/06 Today's Date: 10/24/2017   History of Present Illness  62 y.o. male with medical history significant of systolic congestive heart failure ejection fraction 25-30%, essential hypertension, alcohol use, tobacco use, atrial fibrillation on Xarelto, diabetes mellitus type 2, schizophrenia comes to the hospital for evaluation of loss of consciousness.  Apparently patient was at [address] [place] [address] earlier today and had lots of vodka to drink but does not remember much.  Apparently he had passed out over there therefore EMS was called. MRI brain showed 21mm acute infarct left frontal lobe.     Clinical Impression  Patient evaluated by Physical Therapy with no further acute PT needs identified. All education has been completed and the patient has no further questions. Patient required no device although moving slowly due to reported stiffness and soreness. No loss of balance throughout session and balance testing. Patient reports he is at his baseline and family/aide will continue to provide the assistance he needs. PT is signing off. Thank you for this referral.     Follow Up Recommendations No PT follow up(continued family, caregiver assist as PTA)    Equipment Recommendations  None recommended by PT(pt inquired re: shower seat however Medicare does not cover and pt does not want to pay out of pocket)    Recommendations for Other Services       Precautions / Restrictions Precautions Precautions: Fall Restrictions Weight Bearing Restrictions: No      Mobility  Bed Mobility                  Transfers Overall transfer level: Modified independent Equipment used: None(bil UE support)             General transfer comment: uses armrests and wide BOS  Ambulation/Gait Ambulation/Gait assistance: Modified independent (Device/Increase time) Gait Distance (Feet): 30  Feet(x2) Assistive device: None Gait Pattern/deviations: Step-through pattern;Decreased stride length;Shuffle;Wide base of support     General Gait Details: pt reports wide BOS with feet turned out is his baseline; reports moving a bit slow due to stiffness and soreness from fall.   Stairs            Wheelchair Mobility    Modified Rankin (Stroke Patients Only) Modified Rankin (Stroke Patients Only) Pre-Morbid Rankin Score: Moderate disability Modified Rankin: Moderate disability     Balance Overall balance assessment: Needs assistance         Standing balance support: No upper extremity supported Standing balance-Leahy Scale: Good   Single Leg Stance - Right Leg: 0 Single Leg Stance - Left Leg: 0(pt unwilling to attempt) Tandem Stance - Right Leg: 30(partial tandem--heel of rt foot past left toes, but staggere)   Rhomberg - Eyes Opened: 30 Rhomberg - Eyes Closed: 10(no LOB) High level balance activites: Turns;Head turns;Other (comment)(reaches 10 inches with RUE; turns 360 no LOB)               Pertinent Vitals/Pain Pain Assessment: 0-10 Pain Score: 8  Pain Location: left rib Pain Descriptors / Indicators: Dull(when he inhales) Pain Intervention(s): Limited activity within patient's tolerance;Patient requesting pain meds-RN notified    Home Living Family/patient expects to be discharged to:: Private residence Living Arrangements: Alone Available Help at Discharge: Family;Personal care attendant;Available PRN/intermittently(aide 4 hours/day 5 days per week) Type of Home: Apartment Home Access: Stairs to enter Entrance Stairs-Rails: Right;Left;Can reach both Entrance Stairs-Number of Steps: 3 Home Layout: One level Home Equipment:  Cane - single point;Grab bars - tub/shower;Walker - 2 wheels      Prior Function Level of Independence: Needs assistance   Gait / Transfers Assistance Needed: uses cane/RW as needed (due to back pain)  ADL's / Homemaking  Assistance Needed: aide assist with bathing; housework; sister helps with groceries        Hand Dominance   Dominant Hand: Right    Extremity/Trunk Assessment   Upper Extremity Assessment Upper Extremity Assessment: Overall WFL for tasks assessed(despite rib pain, able to fully reach overhead)    Lower Extremity Assessment Lower Extremity Assessment: (bil knee extension and dorsiflexion 5/5)    Cervical / Trunk Assessment Cervical / Trunk Assessment: Other exceptions(obese)  Communication   Communication: No difficulties  Cognition Arousal/Alertness: Awake/alert Behavior During Therapy: WFL for tasks assessed/performed Overall Cognitive Status: Within Functional Limits for tasks assessed                                        General Comments General comments (skin integrity, edema, etc.): patient reports he is nearly at his baseline for mobility--just a little more stiff/sore    Exercises     Assessment/Plan    PT Assessment Patent does not need any further PT services  PT Problem List         PT Treatment Interventions      PT Goals (Current goals can be found in the Care Plan section)  Acute Rehab PT Goals Patient Stated Goal: get moving more to decrease his soreness PT Goal Formulation: All assessment and education complete, DC therapy    Frequency     Barriers to discharge        Co-evaluation               AM-PAC PT "6 Clicks" Daily Activity  Outcome Measure Difficulty turning over in bed (including adjusting bedclothes, sheets and blankets)?: A Little Difficulty moving from lying on back to sitting on the side of the bed? : A Little Difficulty sitting down on and standing up from a chair with arms (e.g., wheelchair, bedside commode, etc,.)?: A Little Help needed moving to and from a bed to chair (including a wheelchair)?: A Little Help needed walking in hospital room?: None Help needed climbing 3-5 steps with a railing? :  None 6 Click Score: 20    End of Session Equipment Utilized During Treatment: Gait belt Activity Tolerance: Patient tolerated treatment well Patient left: in chair;with call bell/phone within reach Nurse Communication: Mobility status(nurse tech) PT Visit Diagnosis: Unsteadiness on feet (R26.81)    Time: 5597-4163 PT Time Calculation (min) (ACUTE ONLY): 23 min   Charges:   PT Evaluation $PT Eval Low Complexity: 1 Low            [employer], PT 10/24/2017, 12:42 PM

## 2017-10-24 NOTE — Progress Notes (Signed)
Pt states he does not have a ride for discharge but he does have keys to his house  [employer] and requested taxi voucher

## 2017-10-24 NOTE — Progress Notes (Signed)
Orthostatic BP taken and recorded, pt complained some dizziness but not significant drop in the blood pressure noted. vicodin given for a complain of a pain in right rib cage.  Palma Holter, RN

## 2017-10-24 NOTE — Discharge Instructions (Signed)

## 2017-10-24 NOTE — Progress Notes (Signed)
Pt IV removed, catheter intact. Pt removed his telemetry monitor. Pt has all belongings including 36$ from security, witness Psychologist, occupational. Pt discharge instructions went over with pt at bedside. Awaiting transportation
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
## 2017-10-25 ENCOUNTER — Other Ambulatory Visit: Payer: Self-pay | Admitting: *Deleted

## 2017-10-25 NOTE — Patient Outreach (Signed)
[facility]) Care Management  10/25/2017  [name] [name] Aug 06, 1955 [id number]   Noted that member had observation admit 9/27-9/29 for syncope.  Call placed to member follow up on current health condition.  He report he is doing well, denies any injuries.  State "I thought I was young and could do some things I used to."  He admits to drug and alcohol use that led to hospitalization, verbalizes understanding of refraining from use.  He continues to have home health involvement, report he has MD follow up with cardiology and primary MD within the next week.  Denies any urgent concerns.  Home visit scheduled for within the next 2 weeks.  [name], [place], MSN [place] [phone]

## 2017-10-26 ENCOUNTER — Ambulatory Visit
Admission: RE | Admit: 2017-10-26 | Discharge: 2017-10-26 | Disposition: A | Discharge status: Home / Self Care | Payer: Medicare Other | Source: Ambulatory Visit | Attending: Internal Medicine | Admitting: Internal Medicine

## 2017-10-26 ENCOUNTER — Other Ambulatory Visit: Payer: Self-pay | Admitting: Internal Medicine

## 2017-10-26 DIAGNOSIS — G894 Chronic pain syndrome: Secondary | ICD-10-CM

## 2017-11-02 ENCOUNTER — Emergency Department (HOSPITAL_COMMUNITY)
Admission: EM | Admit: 2017-11-02 | Discharge: 2017-11-02 | Disposition: A | Discharge status: Home / Self Care | Payer: Medicare Other | Attending: Emergency Medicine | Admitting: Emergency Medicine

## 2017-11-02 ENCOUNTER — Emergency Department (HOSPITAL_COMMUNITY): Payer: Medicare Other

## 2017-11-02 ENCOUNTER — Other Ambulatory Visit: Payer: Self-pay

## 2017-11-02 ENCOUNTER — Encounter (HOSPITAL_COMMUNITY): Payer: Self-pay

## 2017-11-02 DIAGNOSIS — N182 Chronic kidney disease, stage 2 (mild): Secondary | ICD-10-CM | POA: Insufficient documentation

## 2017-11-02 DIAGNOSIS — S3992XA Unspecified injury of lower back, initial encounter: Secondary | ICD-10-CM | POA: Insufficient documentation

## 2017-11-02 DIAGNOSIS — M545 Low back pain, unspecified: Secondary | ICD-10-CM

## 2017-11-02 DIAGNOSIS — Y92410 Unspecified street and highway as the place of occurrence of the external cause: Secondary | ICD-10-CM | POA: Insufficient documentation

## 2017-11-02 DIAGNOSIS — Y998 Other external cause status: Secondary | ICD-10-CM | POA: Insufficient documentation

## 2017-11-02 DIAGNOSIS — I129 Hypertensive chronic kidney disease with stage 1 through stage 4 chronic kidney disease, or unspecified chronic kidney disease: Secondary | ICD-10-CM | POA: Insufficient documentation

## 2017-11-02 DIAGNOSIS — I4891 Unspecified atrial fibrillation: Secondary | ICD-10-CM | POA: Insufficient documentation

## 2017-11-02 DIAGNOSIS — Y93I9 Activity, other involving external motion: Secondary | ICD-10-CM | POA: Insufficient documentation

## 2017-11-02 DIAGNOSIS — Z79899 Other long term (current) drug therapy: Secondary | ICD-10-CM | POA: Insufficient documentation

## 2017-11-02 MED ORDER — TRAMADOL HCL 50 MG PO TABS: 50.0000 mg | ORAL_TABLET | Freq: Four times a day (QID) | ORAL | 0 refills | Status: DC | PRN

## 2017-11-02 MED ORDER — TRAMADOL HCL 50 MG PO TABS
50.0000 mg | ORAL_TABLET | Freq: Once | ORAL | Status: AC
  Administered 2017-11-02: 50 mg via ORAL
  Filled 2017-11-02: qty 1

## 2017-11-02 MED ORDER — ACETAMINOPHEN 500 MG PO TABS
1000.0000 mg | ORAL_TABLET | Freq: Once | ORAL | Status: AC
  Administered 2017-11-02: 1000 mg via ORAL
  Filled 2017-11-02: qty 2
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
## 2017-11-04 ENCOUNTER — Ambulatory Visit (HOSPITAL_COMMUNITY)
Admission: RE | Admit: 2017-11-04 | Discharge: 2017-11-04 | Disposition: A | Discharge status: Home / Self Care | Payer: Medicare Other | Source: Ambulatory Visit | Attending: Internal Medicine | Admitting: Internal Medicine

## 2017-11-04 ENCOUNTER — Other Ambulatory Visit (HOSPITAL_COMMUNITY): Payer: Self-pay

## 2017-11-04 DIAGNOSIS — I5042 Chronic combined systolic (congestive) and diastolic (congestive) heart failure: Secondary | ICD-10-CM

## 2017-11-04 LAB — DIGOXIN LEVEL: Digoxin Level: 0.6 ng/mL — ABNORMAL LOW (ref 0.8–2.0)

## 2017-11-04 LAB — BASIC METABOLIC PANEL
ANION GAP: 13 (ref 5–15)
BUN: 18 mg/dL (ref 8–23)
CO2: 23 mmol/L (ref 22–32)
Calcium: 10.3 mg/dL (ref 8.9–10.3)
Chloride: 102 mmol/L (ref 98–111)
Creatinine, Ser: 1.83 mg/dL — ABNORMAL HIGH (ref 0.61–1.24)
GFR calc non Af Amer: 38 mL/min — ABNORMAL LOW (ref >60)
GFR, EST AFRICAN AMERICAN: 44 mL/min — AB (ref >60)
Glucose, Bld: 259 mg/dL — ABNORMAL HIGH (ref 70–99)
POTASSIUM: 3.6 mmol/L (ref 3.5–5.1)
Sodium: 138 mmol/L (ref 135–145)

## 2017-11-05 ENCOUNTER — Encounter (HOSPITAL_COMMUNITY): Payer: Self-pay | Admitting: Cardiology

## 2017-11-09 ENCOUNTER — Other Ambulatory Visit: Payer: Self-pay | Admitting: *Deleted

## 2017-11-09 ENCOUNTER — Other Ambulatory Visit (HOSPITAL_COMMUNITY): Payer: Self-pay

## 2017-11-09 NOTE — Patient Outreach (Signed)
[facility]) Care Management  11/09/2017  RAGAN REALE 06-Apr-1955 [id number]   Call placed to member to confirm home visit scheduled for this afternoon, no answer.  Unable to leave voice message as mailbox is full.  Proceeded to [facility] home, no answer.  Will follow up within the next 4 business days.  [name], [place], MSN [place] [phone]

## 2017-11-12 ENCOUNTER — Other Ambulatory Visit: Payer: Self-pay | Admitting: *Deleted

## 2017-11-12 NOTE — Patient Outreach (Signed)
[facility]) Care Management  11/12/2017  [name] [name] 23-Jul-1955 [id number]   Call placed to member to follow up on missed home visit, no answer.  Unable to leave a message as mailbox is full.  Unsuccessful outreach letter sent, will make 3rd attempt to reach within the next 4 business days.  [name], [place], MSN [place] [phone]

## 2017-11-17 ENCOUNTER — Other Ambulatory Visit: Payer: Self-pay | Admitting: *Deleted

## 2017-11-17 NOTE — Patient Outreach (Signed)
[facility]) Care Management  11/17/2017  [name] [name] July 16, 1955 [id number]   Call placed to member to follow up on current health status, no answer.  Unable to leave a message, mailbox full.  Unsuccessful outreach letter sent on 10/18, if no call back by 10/29 will close case.  [name], [place], MSN [place] [phone]

## 2017-11-24 ENCOUNTER — Other Ambulatory Visit: Payer: Self-pay | Admitting: *Deleted

## 2017-11-24 NOTE — Patient Outreach (Signed)
[facility]) Care Management  11/24/2017  JEREMIAS BROYHILL Jul 07, 1955 [id number]   No response from member after 3 unsuccessful outreach attempts.  Will close case due to inability to maintain contact.  Will notify member and primary MD of case closure.  [name], [place], MSN [place] [phone]

## 2017-11-26 ENCOUNTER — Encounter (HOSPITAL_BASED_OUTPATIENT_CLINIC_OR_DEPARTMENT_OTHER): Payer: Self-pay

## 2017-12-16 ENCOUNTER — Ambulatory Visit (HOSPITAL_COMMUNITY)
Admission: RE | Admit: 2017-12-16 | Discharge: 2017-12-16 | Disposition: A | Discharge status: Home / Self Care | Payer: Medicare Other | Source: Ambulatory Visit | Attending: Internal Medicine | Admitting: Internal Medicine

## 2017-12-16 VITALS — BP 144/92 | HR 101 | Wt 293.4 lb

## 2017-12-16 DIAGNOSIS — F259 Schizoaffective disorder, unspecified: Secondary | ICD-10-CM | POA: Diagnosis not present

## 2017-12-16 DIAGNOSIS — N182 Chronic kidney disease, stage 2 (mild): Secondary | ICD-10-CM | POA: Diagnosis not present

## 2017-12-16 DIAGNOSIS — E781 Pure hyperglyceridemia: Secondary | ICD-10-CM | POA: Insufficient documentation

## 2017-12-16 DIAGNOSIS — I428 Other cardiomyopathies: Secondary | ICD-10-CM | POA: Diagnosis not present

## 2017-12-16 DIAGNOSIS — E1169 Type 2 diabetes mellitus with other specified complication: Secondary | ICD-10-CM

## 2017-12-16 DIAGNOSIS — I2510 Atherosclerotic heart disease of native coronary artery without angina pectoris: Secondary | ICD-10-CM | POA: Insufficient documentation

## 2017-12-16 DIAGNOSIS — I480 Paroxysmal atrial fibrillation: Secondary | ICD-10-CM

## 2017-12-16 DIAGNOSIS — I130 Hypertensive heart and chronic kidney disease with heart failure and stage 1 through stage 4 chronic kidney disease, or unspecified chronic kidney disease: Secondary | ICD-10-CM | POA: Insufficient documentation

## 2017-12-16 DIAGNOSIS — Z794 Long term (current) use of insulin: Secondary | ICD-10-CM | POA: Diagnosis not present

## 2017-12-16 DIAGNOSIS — Z7901 Long term (current) use of anticoagulants: Secondary | ICD-10-CM | POA: Insufficient documentation

## 2017-12-16 DIAGNOSIS — Z8546 Personal history of malignant neoplasm of prostate: Secondary | ICD-10-CM | POA: Insufficient documentation

## 2017-12-16 DIAGNOSIS — B1920 Unspecified viral hepatitis C without hepatic coma: Secondary | ICD-10-CM | POA: Diagnosis not present

## 2017-12-16 DIAGNOSIS — Z87891 Personal history of nicotine dependence: Secondary | ICD-10-CM | POA: Insufficient documentation

## 2017-12-16 DIAGNOSIS — E1122 Type 2 diabetes mellitus with diabetic chronic kidney disease: Secondary | ICD-10-CM | POA: Diagnosis not present

## 2017-12-16 DIAGNOSIS — K219 Gastro-esophageal reflux disease without esophagitis: Secondary | ICD-10-CM | POA: Diagnosis not present

## 2017-12-16 DIAGNOSIS — Z9114 Patient's other noncompliance with medication regimen: Secondary | ICD-10-CM | POA: Insufficient documentation

## 2017-12-16 DIAGNOSIS — R9431 Abnormal electrocardiogram [ECG] [EKG]: Secondary | ICD-10-CM | POA: Diagnosis not present

## 2017-12-16 DIAGNOSIS — Z888 Allergy status to other drugs, medicaments and biological substances status: Secondary | ICD-10-CM | POA: Insufficient documentation

## 2017-12-16 DIAGNOSIS — E785 Hyperlipidemia, unspecified: Secondary | ICD-10-CM

## 2017-12-16 DIAGNOSIS — Z79899 Other long term (current) drug therapy: Secondary | ICD-10-CM | POA: Diagnosis not present

## 2017-12-16 DIAGNOSIS — I10 Essential (primary) hypertension: Secondary | ICD-10-CM | POA: Diagnosis not present

## 2017-12-16 DIAGNOSIS — I5042 Chronic combined systolic (congestive) and diastolic (congestive) heart failure: Secondary | ICD-10-CM

## 2017-12-16 DIAGNOSIS — F1010 Alcohol abuse, uncomplicated: Secondary | ICD-10-CM | POA: Insufficient documentation

## 2017-12-16 DIAGNOSIS — Z8673 Personal history of transient ischemic attack (TIA), and cerebral infarction without residual deficits: Secondary | ICD-10-CM | POA: Diagnosis not present

## 2017-12-16 DIAGNOSIS — I440 Atrioventricular block, first degree: Secondary | ICD-10-CM | POA: Insufficient documentation

## 2017-12-16 DIAGNOSIS — R0683 Snoring: Secondary | ICD-10-CM | POA: Diagnosis not present

## 2017-12-16 DIAGNOSIS — I639 Cerebral infarction, unspecified: Secondary | ICD-10-CM

## 2017-12-16 LAB — BASIC METABOLIC PANEL
ANION GAP: 8 (ref 5–15)
BUN: 13 mg/dL (ref 8–23)
CHLORIDE: 105 mmol/L (ref 98–111)
CO2: 24 mmol/L (ref 22–32)
Calcium: 10.5 mg/dL — ABNORMAL HIGH (ref 8.9–10.3)
Creatinine, Ser: 1.21 mg/dL (ref 0.61–1.24)
GFR calc Af Amer: >60 mL/min (ref >60)
Glucose, Bld: 240 mg/dL — ABNORMAL HIGH (ref 70–99)
POTASSIUM: 4.4 mmol/L (ref 3.5–5.1)
SODIUM: 137 mmol/L (ref 135–145)

## 2017-12-16 NOTE — Patient Instructions (Signed)
Labs today We will only contact you if something comes back abnormal or we need to make some changes. Otherwise no news is good news!  Your physician recommends that you schedule a follow-up appointment in: 3 months in [facility].

## 2017-12-16 NOTE — Progress Notes (Signed)
Patient ID: [name] [name], male   DOB: 1955/09/10, 62 y.o.   MRN: [id number]    Advanced Heart Failure Clinic Note   Primary Care: Internal Medicine clinic, Dr. Silverio Decamp Primary Cardiologist: Dr. Doylene Canard  GI: Dr Ardis Hughs.  Primary HF: Dr. Haroldine Laws   HPI: [name] [name] is a 62 y.o. 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 Had history of acute renal failure in setting of rhabdo requiring short term HD.     Please see previous progress note from 08/04/17 for information regarding admissions prior to 07/2017  Admitted 8/13 - 8/19 with A/C CHF. Diuresed with IV lasix and metolazone for a total of 26 lbs down. Restarted on Entresto and Torsemide 80 mg daily for home. Discharge weight 282 lbs.   Admitted 9/27 - 9/29 after fall secondary to cocaine and ETOH binge. MRI showed acute CVA in setting of not taking his Xarelto.   He presents today for regular follow up. Doing well overall. Has social services come out to his house and check on him weekly, and has an aide most days. Walking upwards of 1 block, weather permitting, without SOB. States he has been compliant with his Xarelto. Denies orthopnea, PND, CP, lightheadedness or dizziness. Has cut back on alcohol, only drinking on occasion. Tries to remember to weight daily. Watching salt and fluid intake.   ECHO 10/27/12 EF 40-45%  ECHO 06/04/15 LVEF 20-25%, decreased diastolic compliance, LAE, RV severely reduced.  ECHO 11/26/15 EF 30%  Echo 12/26/15 EF 30-35% RV ok.  ECHO 05/31/17 EF 25-30% Echo 09/08/17: EF 25-30%  RHC 08/2015 RA = 7 RV = 35/10 PA = 37/15 (25) PCW = 10 Fick cardiac output/index = 4.3/1.7 PVR = 3.5 WU Ao sat = 95% PA sat = 58%, 58% Assessment: 1. Volume status looks good. 2. Cardiac output  depressed  2014 LHC -  Left main: Normal LAD: Large vessel gives of 2 diagonals. Normal LCX: Very large vessel with 3 OMs. 20-30% plaque in midsection.  RCA: Dominant. Normal LV-gram done in the RAO projection: Ejection fraction = 35-40% global HK Assessment: 1. Minimal CAD  Labs 02/12/13 K 4.3 Creatinine 0.98          08/2013: K 3.9, creatinine 1.27, Mag 1.8, pro-BNP 392, Troponin 0.05          06/07/15: K 4.2, creatinine 1.21          07/10/2015: K 3.9 Creatinine 1.16           09/03/2015: K 4.7 Creatinine 1.32          09/11/2015: K 4.1 Creatinine 1.29   SH: Quit smoking 9 years ago. Lives alone. Quit ETOH 2013/10/02 FH: Mom died from MI in her early 31s  Brother MI and deceased. Does not know about fathers health history.   Review of systems complete and found to be negative unless listed in HPI.    Past Medical History:  Diagnosis Date  . Atrial fibrillation ([place])   . Cancer [facility])    Prostate cancer-bx. 3 weeks ago  . Cataract   . Chronic combined systolic and diastolic CHF (congestive heart failure) (HCC)    a) EF 40-45% per 2D echo (02/2012) with grade 1 DD b)  NICM c) RHC (04/2012): RA: 4, RV 45/3/4, PA 42/9 (24), PCWP 14, Fick CO/CI:  5.2 /2.2, PVR 1.9 WU, PA 62% and 64% d) ECHO (10/2012) EF 40-45%, grade II DD, RV nl  . CKD (chronic kidney disease) stage 2, GFR 60-89 ml/min    BL SCr approximately 1-1.3  . Colitis 05/2009   History of colitis of ascending colon noted on CT abd/pelvis (05/2009), with interval resolution with subsequent CT  . Continuous chronic alcoholism ([place])   . Degenerative lumbar spinal stenosis    s/p L2-3, L3-4, L4-5 laminectomy partial facetectomy, and bilateral foraminotomy  . Diabetes mellitus without complication ([place])    Type II  . Dysrhythmia    A. Fib  . Family history of adverse reaction to anesthesia    "sister, can't go to sleep"  . GERD (gastroesophageal reflux disease)   . H/O cocaine abuse ([place])   . Hepatic steatosis    suspected  2/2 alcohol abuse  . Hepatitis C   . History of pancreatitis 01/2011   Admission for acute pancreatitis presumed 2/2 ongoing alcohol abuse- and hypertriglyceridemia  . History of pneumonia   . HIT (heparin-induced thrombocytopenia) ([place])   . Hyperlipidemia   . Hypertension    uncontrolled with medication noncompliance  . Hypertriglyceridemia   . NICM (nonischemic cardiomyopathy) ([place])    a. LHC (04/2012): nl arteries  . PFO (patent foramen ovale) 01/2012   with right to left shunt, noted per TEE in evaluation for source of embolic stroke in 03/3543  . Rhabdomyolysis 02/22/2012   H/O rhabdomyolysis in 01/2012 that was idiopathic, cause never identified  . Schizophrenia, schizo-affective ([place])   . Shortness of breath dyspnea   . Splenic cyst   . Stroke ([place]) 01/2012   Small cerebellar infarcts right greater than left as well as questionable acute left external capsule and caudate nuclear punctate lacunar infarcts noted per MRI (01/2012) - presumed to be embolic likely source PFO with right to left shunt (noted per TEE 01/ 2014)    Current Outpatient Medications  Medication Sig Dispense Refill  . ACCU-CHEK FASTCLIX LANCETS MISC Check blood sugar 4 times a day before meals and bedtime 204 each 5  . allopurinol (ZYLOPRIM) 100 MG tablet Take 1 tablet (100 mg total) by mouth daily. 30 tablet 0  . B-D UF III MINI PEN NEEDLES 31G X 5 MM MISC Use four times daily as directed. Dx code E11.00, Z79.4 100 each 3  . Blood Glucose Monitoring Suppl (ACCU-CHEK GUIDE) w/Device KIT 1 each by Does not apply route 4 (four) times daily. 1 kit 1  . carvedilol (COREG) 3.125 MG tablet Take 1 tablet (3.125 mg total) by mouth 2 (two) times daily with a meal. 60 tablet 6  . cyclobenzaprine (FLEXERIL) 10 MG tablet Take 10 mg by mouth 3 (three) times daily as needed for muscle spasms.  0  . digoxin (LANOXIN) 0.125 MG tablet Take 1 tablet (0.125 mg total) by mouth daily. 30 tablet 0  . eplerenone (INSPRA) 25 MG tablet  Take 1 tablet (25 mg total) by mouth daily. 30 tablet 6  . glucose blood (ACCU-CHEK GUIDE) test strip Check blood sugar 4 times a day before meal and bedtime 150 each 5  . Insulin Degludec (TRESIBA FLEXTOUCH) 200 UNIT/ML SOPN Inject into the skin.    Marland Kitchen insulin regular (NOVOLIN R,HUMULIN R) 100 units/mL injection Inject 0.1 mLs (10 Units total) into the skin 3 (three) times daily before meals. 3 times with meals.Take 30 minutes before meal. 15 mL 1  . metFORMIN (GLUCOPHAGE) 500 MG tablet Take 500 mg by mouth  2 (two) times daily with a meal.    . QUEtiapine (SEROQUEL) 400 MG tablet Take 1 tablet (400 mg total) by mouth 2 (two) times daily for 21 days. (Patient taking differently: Take 800 mg by mouth at bedtime. ) 42 tablet 0  . rivaroxaban (XARELTO) 20 MG TABS tablet Take 1 tablet (20 mg total) by mouth daily. 30 tablet 6  . rosuvastatin (CRESTOR) 40 MG tablet Take 1 tablet (40 mg total) by mouth daily. 90 tablet 3  . sacubitril-valsartan (ENTRESTO) 49-51 MG Take 1 tablet by mouth 2 (two) times daily. 60 tablet 3  . torsemide (DEMADEX) 20 MG tablet Take 4 tablets (80 mg total) by mouth daily. 90 tablet 6   No current facility-administered medications for this encounter.     Allergies  Allergen Reactions  . Heparin Other (See Comments)    Documented HIT under problem list Pt states he's not allergic. " They told me not take it anymore"  . Spironolactone Other (See Comments)    Breast tenderness  . Thorazine [Chlorpromazine Hcl] Other (See Comments)    Body freezes up   Vitals:   12/16/17 0912  BP: (!) 144/92  Pulse: (!) 101  SpO2: 95%  Weight: 133.1 kg (293 lb 6.4 oz)     Wt Readings from Last 3 Encounters:  12/16/17 133.1 kg (293 lb 6.4 oz)  11/02/17 132.9 kg (293 lb)  10/24/17 131.5 kg (289 lb 14.4 oz)    PHYSICAL EXAM: General: Well appearing. No resp difficulty. HEENT: Normal Neck: Supple. JVP 5-6. Carotids 2+ bilat; no bruits. No thyromegaly or nodule noted. Cor: PMI  nondisplaced. Regular, slightly tachy, No M/G/R noted Lungs: CTAB, normal effort. Abdomen: Soft, non-tender, non-distended, no HSM. No bruits or masses. +BS  Extremities: No cyanosis, clubbing, or rash. R and LLE no edema. Chronic venous stasis changes.  Neuro: Alert & orientedx3, cranial nerves grossly intact. moves all 4 extremities w/o difficulty. Affect pleasant   ASSESSMENT /PLAN 1) Chronic combined systolic/diastolic HF: EF 84-13%, grade II DD (10/2012) now decreased to 20-25%  (05/2015) RV severely reduced. Echo 4/19 EF 30-35% RV ok. Had [place] 2014 with minimal CAD. Holter showed <3% PVC burden.  - Echo 05/31/17 EF 25-30% - Echo 09/08/17 LVEF 25-30%, Mild LAE.  - NYHA II-III symptoms chronically. - Volume status stable on exam.   - Continue torsemide 80 mg daily.  - Continue eplerenone 25 mg daily (breast tenderness with spiro, now resolved) - Continue carvedilol 3.125 mg BID - Continue Entresto 49/51 mg BID.  - Continue digoxin 0.125 mg daily - Stressed the importance of taking all medications every day. Refuses HF paramedicine.  - Not a candidate for ICD with recurrent wounds. - Reinforced fluid restriction to < 2 L daily, sodium restriction to less than 2000 mg daily, and the importance of daily weights.    2) HTN - Meds as above.  3) PAF:  - Continue Xarelto 20 mg daily. Denies bleeding.  - Regular on exam 4) ETOH abuse - Encouraged complete cessation 5) CKD stage II - Cr 1.83 11/04/17.  - BMET today.  6) ?OSA - Has been ordered for sleep study multiple times but not attended. Would benefit from home sleep study once available.  7) DMII - Per PCP.  8) Medication Noncompliance - Has refused HF paramedicine.  10) Hyperlipidemia - Continue crestor 11) CVA - Noted 09/2017 - No deficits.  - Continue Xarelto  Shirley Friar, PA-C  9:24 AM  Greater than 50% of the  25 minute visit was spent in counseling/coordination of care regarding disease state education,  salt/fluid restriction, sliding scale diuretics, and medication compliance.

## 2017-12-27 ENCOUNTER — Encounter (HOSPITAL_COMMUNITY): Payer: Self-pay | Admitting: Internal Medicine

## 2017-12-27 ENCOUNTER — Other Ambulatory Visit: Payer: Self-pay

## 2017-12-27 ENCOUNTER — Emergency Department (HOSPITAL_COMMUNITY): Payer: Medicare Other

## 2017-12-27 ENCOUNTER — Inpatient Hospital Stay (HOSPITAL_COMMUNITY)
Admission: EM | Admit: 2017-12-27 | Discharge: 2017-12-31 | DRG: 291 | Disposition: A | Discharge status: Home / Self Care | Payer: Medicare Other | Attending: Internal Medicine | Admitting: Internal Medicine

## 2017-12-27 DIAGNOSIS — J9621 Acute and chronic respiratory failure with hypoxia: Secondary | ICD-10-CM | POA: Diagnosis present

## 2017-12-27 DIAGNOSIS — M1A9XX0 Chronic gout, unspecified, without tophus (tophi): Secondary | ICD-10-CM | POA: Diagnosis present

## 2017-12-27 DIAGNOSIS — I509 Heart failure, unspecified: Secondary | ICD-10-CM

## 2017-12-27 DIAGNOSIS — K219 Gastro-esophageal reflux disease without esophagitis: Secondary | ICD-10-CM | POA: Diagnosis present

## 2017-12-27 DIAGNOSIS — E785 Hyperlipidemia, unspecified: Secondary | ICD-10-CM | POA: Diagnosis present

## 2017-12-27 DIAGNOSIS — I159 Secondary hypertension, unspecified: Secondary | ICD-10-CM | POA: Diagnosis not present

## 2017-12-27 DIAGNOSIS — Z9119 Patient's noncompliance with other medical treatment and regimen: Secondary | ICD-10-CM

## 2017-12-27 DIAGNOSIS — I10 Essential (primary) hypertension: Secondary | ICD-10-CM | POA: Diagnosis present

## 2017-12-27 DIAGNOSIS — F1010 Alcohol abuse, uncomplicated: Secondary | ICD-10-CM | POA: Diagnosis present

## 2017-12-27 DIAGNOSIS — I445 Left posterior fascicular block: Secondary | ICD-10-CM | POA: Diagnosis present

## 2017-12-27 DIAGNOSIS — N182 Chronic kidney disease, stage 2 (mild): Secondary | ICD-10-CM

## 2017-12-27 DIAGNOSIS — R5381 Other malaise: Secondary | ICD-10-CM

## 2017-12-27 DIAGNOSIS — F1020 Alcohol dependence, uncomplicated: Secondary | ICD-10-CM | POA: Diagnosis present

## 2017-12-27 DIAGNOSIS — R7989 Other specified abnormal findings of blood chemistry: Secondary | ICD-10-CM | POA: Diagnosis not present

## 2017-12-27 DIAGNOSIS — I428 Other cardiomyopathies: Secondary | ICD-10-CM | POA: Diagnosis present

## 2017-12-27 DIAGNOSIS — Z794 Long term (current) use of insulin: Secondary | ICD-10-CM

## 2017-12-27 DIAGNOSIS — I480 Paroxysmal atrial fibrillation: Secondary | ICD-10-CM | POA: Diagnosis present

## 2017-12-27 DIAGNOSIS — E1165 Type 2 diabetes mellitus with hyperglycemia: Secondary | ICD-10-CM | POA: Diagnosis present

## 2017-12-27 DIAGNOSIS — Z885 Allergy status to narcotic agent status: Secondary | ICD-10-CM | POA: Diagnosis not present

## 2017-12-27 DIAGNOSIS — I5042 Chronic combined systolic (congestive) and diastolic (congestive) heart failure: Secondary | ICD-10-CM | POA: Diagnosis present

## 2017-12-27 DIAGNOSIS — Z961 Presence of intraocular lens: Secondary | ICD-10-CM | POA: Diagnosis present

## 2017-12-27 DIAGNOSIS — I5043 Acute on chronic combined systolic (congestive) and diastolic (congestive) heart failure: Secondary | ICD-10-CM

## 2017-12-27 DIAGNOSIS — I130 Hypertensive heart and chronic kidney disease with heart failure and stage 1 through stage 4 chronic kidney disease, or unspecified chronic kidney disease: Principal | ICD-10-CM | POA: Diagnosis present

## 2017-12-27 DIAGNOSIS — E1169 Type 2 diabetes mellitus with other specified complication: Secondary | ICD-10-CM | POA: Diagnosis not present

## 2017-12-27 DIAGNOSIS — Z888 Allergy status to other drugs, medicaments and biological substances status: Secondary | ICD-10-CM | POA: Diagnosis not present

## 2017-12-27 DIAGNOSIS — Z79899 Other long term (current) drug therapy: Secondary | ICD-10-CM

## 2017-12-27 DIAGNOSIS — N183 Chronic kidney disease, stage 3 (moderate): Secondary | ICD-10-CM | POA: Diagnosis present

## 2017-12-27 DIAGNOSIS — E1122 Type 2 diabetes mellitus with diabetic chronic kidney disease: Secondary | ICD-10-CM | POA: Diagnosis present

## 2017-12-27 DIAGNOSIS — F209 Schizophrenia, unspecified: Secondary | ICD-10-CM | POA: Diagnosis present

## 2017-12-27 DIAGNOSIS — I959 Hypotension, unspecified: Secondary | ICD-10-CM | POA: Diagnosis present

## 2017-12-27 DIAGNOSIS — Z9841 Cataract extraction status, right eye: Secondary | ICD-10-CM | POA: Diagnosis not present

## 2017-12-27 DIAGNOSIS — Z8546 Personal history of malignant neoplasm of prostate: Secondary | ICD-10-CM | POA: Diagnosis not present

## 2017-12-27 DIAGNOSIS — E781 Pure hyperglyceridemia: Secondary | ICD-10-CM | POA: Diagnosis present

## 2017-12-27 DIAGNOSIS — Z8673 Personal history of transient ischemic attack (TIA), and cerebral infarction without residual deficits: Secondary | ICD-10-CM | POA: Diagnosis not present

## 2017-12-27 DIAGNOSIS — Z8249 Family history of ischemic heart disease and other diseases of the circulatory system: Secondary | ICD-10-CM

## 2017-12-27 DIAGNOSIS — M109 Gout, unspecified: Secondary | ICD-10-CM

## 2017-12-27 DIAGNOSIS — R7401 Elevation of levels of liver transaminase levels: Secondary | ICD-10-CM

## 2017-12-27 DIAGNOSIS — Z87891 Personal history of nicotine dependence: Secondary | ICD-10-CM | POA: Diagnosis not present

## 2017-12-27 DIAGNOSIS — K760 Fatty (change of) liver, not elsewhere classified: Secondary | ICD-10-CM | POA: Diagnosis present

## 2017-12-27 DIAGNOSIS — R778 Other specified abnormalities of plasma proteins: Secondary | ICD-10-CM | POA: Diagnosis present

## 2017-12-27 DIAGNOSIS — Z7901 Long term (current) use of anticoagulants: Secondary | ICD-10-CM

## 2017-12-27 DIAGNOSIS — R740 Nonspecific elevation of levels of transaminase and lactic acid dehydrogenase [LDH]: Secondary | ICD-10-CM

## 2017-12-27 DIAGNOSIS — E119 Type 2 diabetes mellitus without complications: Secondary | ICD-10-CM

## 2017-12-27 DIAGNOSIS — I440 Atrioventricular block, first degree: Secondary | ICD-10-CM

## 2017-12-27 LAB — CBC WITH DIFFERENTIAL/PLATELET
ABS IMMATURE GRANULOCYTES: 0.02 10*3/uL (ref 0.00–0.07)
BASOS ABS: 0 10*3/uL (ref 0.0–0.1)
Basophils Relative: 0 %
EOS PCT: 2 %
Eosinophils Absolute: 0.1 10*3/uL (ref 0.0–0.5)
HEMATOCRIT: 42.9 % (ref 39.0–52.0)
Hemoglobin: 13 g/dL (ref 13.0–17.0)
IMMATURE GRANULOCYTES: 0 %
LYMPHS ABS: 1.5 10*3/uL (ref 0.7–4.0)
LYMPHS PCT: 22 %
MCH: 28.1 pg (ref 26.0–34.0)
MCHC: 30.3 g/dL (ref 30.0–36.0)
MCV: 92.9 fL (ref 80.0–100.0)
Monocytes Absolute: 0.5 10*3/uL (ref 0.1–1.0)
Monocytes Relative: 8 %
NRBC: 0 % (ref 0.0–0.2)
Neutro Abs: 4.6 10*3/uL (ref 1.7–7.7)
Neutrophils Relative %: 68 %
Platelets: 172 10*3/uL (ref 150–400)
RBC: 4.62 MIL/uL (ref 4.22–5.81)
RDW: 13.8 % (ref 11.5–15.5)
WBC: 6.7 10*3/uL (ref 4.0–10.5)

## 2017-12-27 LAB — COMPREHENSIVE METABOLIC PANEL
ALK PHOS: 110 U/L (ref 38–126)
ALT: 69 U/L — AB (ref 0–44)
AST: 56 U/L — AB (ref 15–41)
Albumin: 3.8 g/dL (ref 3.5–5.0)
Anion gap: 9 (ref 5–15)
BILIRUBIN TOTAL: 0.8 mg/dL (ref 0.3–1.2)
BUN: 13 mg/dL (ref 8–23)
CALCIUM: 10.3 mg/dL (ref 8.9–10.3)
CO2: 25 mmol/L (ref 22–32)
CREATININE: 1.33 mg/dL — AB (ref 0.61–1.24)
Chloride: 104 mmol/L (ref 98–111)
GFR calc Af Amer: >60 mL/min (ref >60)
GFR calc non Af Amer: 56 mL/min — ABNORMAL LOW (ref >60)
Glucose, Bld: 158 mg/dL — ABNORMAL HIGH (ref 70–99)
Potassium: 4.3 mmol/L (ref 3.5–5.1)
Sodium: 138 mmol/L (ref 135–145)
TOTAL PROTEIN: 7.4 g/dL (ref 6.5–8.1)

## 2017-12-27 LAB — DIGOXIN LEVEL: Digoxin Level: <0.2 ng/mL — ABNORMAL LOW (ref 0.8–2.0)

## 2017-12-27 LAB — BRAIN NATRIURETIC PEPTIDE: B Natriuretic Peptide: 357.1 pg/mL — ABNORMAL HIGH (ref 0.0–100.0)

## 2017-12-27 LAB — I-STAT TROPONIN, ED: TROPONIN I, POC: 0.13 ng/mL — AB (ref 0.00–0.08)

## 2017-12-27 LAB — TROPONIN I: Troponin I: 0.08 ng/mL (ref <0.03)

## 2017-12-27 MED ORDER — CARVEDILOL 3.125 MG PO TABS
3.1250 mg | ORAL_TABLET | Freq: Two times a day (BID) | ORAL | Status: DC
  Administered 2017-12-28: 3.125 mg via ORAL
  Filled 2017-12-27: qty 1

## 2017-12-27 MED ORDER — ALLOPURINOL 100 MG PO TABS
100.0000 mg | ORAL_TABLET | Freq: Every day | ORAL | Status: DC
  Administered 2017-12-28 – 2017-12-31 (×4): 100 mg via ORAL
  Filled 2017-12-27 (×4): qty 1

## 2017-12-27 MED ORDER — SODIUM CHLORIDE 0.9 % IV SOLN
INTRAVENOUS | Status: DC
  Administered 2017-12-27: 19:00:00 via INTRAVENOUS

## 2017-12-27 MED ORDER — INSULIN ASPART 100 UNIT/ML ~~LOC~~ SOLN
0.0000 [IU] | Freq: Three times a day (TID) | SUBCUTANEOUS | Status: DC
  Administered 2017-12-28 (×3): 2 [IU] via SUBCUTANEOUS
  Administered 2017-12-29: 5 [IU] via SUBCUTANEOUS
  Administered 2017-12-29 (×2): 2 [IU] via SUBCUTANEOUS
  Administered 2017-12-30 (×3): 3 [IU] via SUBCUTANEOUS

## 2017-12-27 MED ORDER — FOLIC ACID 1 MG PO TABS
1.0000 mg | ORAL_TABLET | Freq: Every day | ORAL | Status: DC
  Administered 2017-12-28 – 2017-12-31 (×4): 1 mg via ORAL
  Filled 2017-12-27 (×4): qty 1

## 2017-12-27 MED ORDER — VITAMIN B-1 100 MG PO TABS
100.0000 mg | ORAL_TABLET | Freq: Every day | ORAL | Status: DC
  Administered 2017-12-28 – 2017-12-31 (×4): 100 mg via ORAL
  Filled 2017-12-27 (×4): qty 1

## 2017-12-27 MED ORDER — ROSUVASTATIN CALCIUM 20 MG PO TABS
40.0000 mg | ORAL_TABLET | Freq: Every day | ORAL | Status: DC
  Administered 2017-12-28 – 2017-12-31 (×4): 40 mg via ORAL
  Filled 2017-12-27 (×4): qty 2

## 2017-12-27 MED ORDER — QUETIAPINE FUMARATE 400 MG PO TABS
400.0000 mg | ORAL_TABLET | Freq: Two times a day (BID) | ORAL | Status: DC
  Administered 2017-12-28 – 2017-12-30 (×7): 400 mg via ORAL
  Filled 2017-12-27 (×8): qty 1

## 2017-12-27 MED ORDER — RIVAROXABAN 20 MG PO TABS
20.0000 mg | ORAL_TABLET | Freq: Every day | ORAL | Status: DC
  Administered 2017-12-28 – 2017-12-31 (×4): 20 mg via ORAL
  Filled 2017-12-27 (×4): qty 1

## 2017-12-27 MED ORDER — THIAMINE HCL 100 MG/ML IJ SOLN
100.0000 mg | Freq: Every day | INTRAMUSCULAR | Status: DC
  Filled 2017-12-27 (×2): qty 2

## 2017-12-27 MED ORDER — FUROSEMIDE 10 MG/ML IJ SOLN
60.0000 mg | Freq: Two times a day (BID) | INTRAMUSCULAR | Status: DC
  Administered 2017-12-28 (×2): 60 mg via INTRAVENOUS
  Filled 2017-12-27 (×2): qty 6

## 2017-12-27 MED ORDER — FUROSEMIDE 10 MG/ML IJ SOLN
60.0000 mg | Freq: Once | INTRAMUSCULAR | Status: AC
  Administered 2017-12-27: 60 mg via INTRAVENOUS
  Filled 2017-12-27: qty 6

## 2017-12-27 MED ORDER — ADULT MULTIVITAMIN W/MINERALS CH
1.0000 | ORAL_TABLET | Freq: Every day | ORAL | Status: DC
  Administered 2017-12-28 – 2017-12-31 (×4): 1 via ORAL
  Filled 2017-12-27 (×4): qty 1

## 2017-12-27 MED ORDER — QUETIAPINE FUMARATE 400 MG PO TABS: 800.0000 mg | ORAL_TABLET | Freq: Every day | ORAL | Status: DC

## 2017-12-27 MED ORDER — DIGOXIN 125 MCG PO TABS
0.1250 mg | ORAL_TABLET | Freq: Every day | ORAL | Status: DC
  Administered 2017-12-28 – 2017-12-31 (×4): 0.125 mg via ORAL
  Filled 2017-12-27 (×4): qty 1

## 2017-12-27 MED ORDER — LORAZEPAM 1 MG PO TABS: 1.0000 mg | ORAL_TABLET | Freq: Four times a day (QID) | ORAL | Status: AC | PRN

## 2017-12-27 MED ORDER — LORAZEPAM 2 MG/ML IJ SOLN: 1.0000 mg | Freq: Four times a day (QID) | INTRAMUSCULAR | Status: AC | PRN

## 2017-12-27 NOTE — H&P (Signed)
History and Physical    [name]:[id number] DOB: 22-Dec-1955 DOA: 12/27/2017  PCP: [name], MD Patient coming from: Home  Chief Complaint: Shortness of breath  HPI: [name] [name] is a 62 y.o. male with medical history significant of A. fib, chronic combined systolic and diastolic congestive heart failure, CKD 2, prostate cancer, alcohol abuse, type 2 diabetes, hypertension, hyperlipidemia, CVA presenting to the hospital for evaluation of shortness of breath.  Patient reports 3-day history of dyspnea on exertion, orthopnea, paroxysmal nocturnal dyspnea, cough, and abdominal tightness.  No abdominal pain.  He is currently taking torsemide 80 mg daily.  Does state that he has been eating more salt this past week due to Thanksgiving.  Denies having any fevers, chills, or chest pain.  ED Course: Placed on 15 L oxygen via nonrebreather by EMS.  Oxygen saturation 90% on room air in the ED, placed on 2 L supplemental oxygen.  BNP pending.  No leukocytosis.  Troponin 0.13.  EKG not suggestive of ACS.  Chest x-ray showing new bilateral heterogenous pulmonary opacities which may represent pulmonary edema and small left pleural effusion; superimposed infection not excluded.  He received IV Lasix 60 mg once in the ED.  Review of Systems: As per HPI otherwise 10 point review of systems negative.  Past Medical History:  Diagnosis Date  . Atrial fibrillation ([place])   . Cancer [facility])    Prostate cancer-bx. 3 weeks ago  . Cataract   . Chronic combined systolic and diastolic CHF (congestive heart failure) (HCC)    a) EF 40-45% per 2D echo (02/2012) with grade 1 DD b)  NICM c) RHC (04/2012): RA: 4, RV 45/3/4, PA 42/9 (24), PCWP 14, Fick CO/CI: 5.2 /2.2, PVR 1.9 WU, PA 62% and 64% d) ECHO (10/2012) EF 40-45%, grade II DD, RV nl  . CKD (chronic kidney disease) stage 2, GFR 60-89 ml/min    BL SCr approximately 1-1.3  . Colitis 05/2009   History of colitis of ascending colon noted on CT  abd/pelvis (05/2009), with interval resolution with subsequent CT  . Continuous chronic alcoholism ([place])   . Degenerative lumbar spinal stenosis    s/p L2-3, L3-4, L4-5 laminectomy partial facetectomy, and bilateral foraminotomy  . Diabetes mellitus without complication ([place])    Type II  . Dysrhythmia    A. Fib  . Family history of adverse reaction to anesthesia    "sister, can't go to sleep"  . GERD (gastroesophageal reflux disease)   . H/O cocaine abuse ([place])   . Hepatic steatosis    suspected 2/2 alcohol abuse  . Hepatitis C   . History of pancreatitis 01/2011   Admission for acute pancreatitis presumed 2/2 ongoing alcohol abuse- and hypertriglyceridemia  . History of pneumonia   . HIT (heparin-induced thrombocytopenia) ([place])   . Hyperlipidemia   . Hypertension    uncontrolled with medication noncompliance  . Hypertriglyceridemia   . NICM (nonischemic cardiomyopathy) ([place])    a. LHC (04/2012): nl arteries  . PFO (patent foramen ovale) 01/2012   with right to left shunt, noted per TEE in evaluation for source of embolic stroke in 07/6281  . Rhabdomyolysis 02/22/2012   H/O rhabdomyolysis in 01/2012 that was idiopathic, cause never identified  . Schizophrenia, schizo-affective ([place])   . Shortness of breath dyspnea   . Splenic cyst   . Stroke ([place]) 01/2012   Small cerebellar infarcts right greater than left as well as questionable acute left external capsule and caudate nuclear punctate lacunar infarcts  noted per MRI (01/2012) - presumed to be embolic likely source PFO with right to left shunt (noted per TEE 01/ 2014)    Past Surgical History:  Procedure Laterality Date  . ACHILLES TENDON REPAIR Right 2007   "it was torn"  . BACK SURGERY    . CARDIAC CATHETERIZATION N/A 09/02/2015   Procedure: Right Heart Cath;  Surgeon: Jolaine Artist, MD;  Location: [facility];  Service: Cardiovascular;  Laterality: N/A;  . CATARACT EXTRACTION W/ INTRAOCULAR LENS IMPLANT Right   .  ESOPHAGOGASTRODUODENOSCOPY N/A 06/25/2012   Procedure: ESOPHAGOGASTRODUODENOSCOPY (EGD);  Surgeon: Milus Banister, MD;  Location: [facility];  Service: Endoscopy;  Laterality: N/A;  . I&D EXTREMITY  03/20/2011   Procedure: IRRIGATION AND DEBRIDEMENT EXTREMITY;  Surgeon: Kerin Salen, MD;  Location: [facility];  Service: Orthopedics;  Laterality: Left;  I&D LEFT ACHILLIES TENDON  . KNEE ARTHROSCOPY Left   . LUMBAR LAMINECTOMY     L2-3, L3-4, L4-5 laminectomy, partial facetectomy  . RESECTION DISTAL CLAVICAL  09/17/2011   Procedure: RESECTION DISTAL CLAVICAL;  Surgeon: Nita Sells, MD;  Location: [facility];  Service: Orthopedics;  Laterality: Right;  right shoulder arthroscopy with sad and open distal clavicle excision   . ROBOT ASSISTED LAPAROSCOPIC RADICAL PROSTATECTOMY N/A 12/16/2012   Procedure: ROBOTIC ASSISTED LAPAROSCOPIC PROSTATECTOMY ;  Surgeon: Ardis Hughs, MD;  Location: [facility];  Service: Urology;  Laterality: N/A;  . TONSILLECTOMY       reports that he quit smoking about 16 years ago. His smoking use included cigarettes. He has a 30.00 pack-year smoking history. He has never used smokeless tobacco. He reports that he drinks about 6.0 standard drinks of alcohol per week. He reports that he has current or past drug history. Drugs: "Crack" cocaine and Cocaine.  Allergies  Allergen Reactions  . Heparin Other (See Comments)    Documented HIT under problem list Pt states he's not allergic. " They told me not take it anymore"  . Spironolactone Other (See Comments)    Breast tenderness  . Thorazine [Chlorpromazine Hcl] Other (See Comments)    Body freezes up    Family History  Problem Relation Age of Onset  . CAD Mother 26       deceased  . CAD Sister   . CAD Brother 37       died from MI at age 57yo  . Hypertension Unknown     Prior to Admission medications   Medication Sig Start Date End Date Taking? Authorizing Provider  ACCU-CHEK FASTCLIX  LANCETS MISC Check blood sugar 4 times a day before meals and bedtime 02/16/17   [name], MD  allopurinol (ZYLOPRIM) 100 MG tablet Take 1 tablet (100 mg total) by mouth daily. 09/14/17   Regalado, Belkys A, MD  B-D UF III MINI PEN NEEDLES 31G X 5 MM MISC Use four times daily as directed. Dx code E11.00, Z79.4 12/23/16   Aldine Contes, MD  Blood Glucose Monitoring Suppl (ACCU-CHEK GUIDE) w/Device KIT 1 each by Does not apply route 4 (four) times daily. 02/16/17   [name], MD  carvedilol (COREG) 3.125 MG tablet Take 1 tablet (3.125 mg total) by mouth 2 (two) times daily with a meal. 06/03/17   Tillery, Satira Mccallum, PA-C  cyclobenzaprine (FLEXERIL) 10 MG tablet Take 10 mg by mouth 3 (three) times daily as needed for muscle spasms. 10/26/17   [provider]  digoxin (LANOXIN) 0.125 MG tablet Take 1 tablet (0.125 mg  total) by mouth daily. 09/14/17   Regalado, Belkys A, MD  eplerenone (INSPRA) 25 MG tablet Take 1 tablet (25 mg total) by mouth daily. 06/23/17   Bensimhon, Shaune Pascal, MD  glucose blood (ACCU-CHEK GUIDE) test strip Check blood sugar 4 times a day before meal and bedtime 02/16/17   [name], MD  Insulin Degludec (TRESIBA FLEXTOUCH) 200 UNIT/ML SOPN Inject into the skin.    [provider]  insulin regular (NOVOLIN R,HUMULIN R) 100 units/mL injection Inject 0.1 mLs (10 Units total) into the skin 3 (three) times daily before meals. 3 times with meals.Take 30 minutes before meal. 09/13/17   Regalado, Belkys A, MD  metFORMIN (GLUCOPHAGE) 500 MG tablet Take 500 mg by mouth 2 (two) times daily with a meal.    [provider]  QUEtiapine (SEROQUEL) 400 MG tablet Take 1 tablet (400 mg total) by mouth 2 (two) times daily for 21 days. Patient taking differently: Take 800 mg by mouth at bedtime.  06/03/17 01/21/18  Elwin Mocha, MD  rivaroxaban (XARELTO) 20 MG TABS tablet Take 1 tablet (20 mg total) by mouth daily. 06/04/17   Shirley Friar, PA-C    rosuvastatin (CRESTOR) 40 MG tablet Take 1 tablet (40 mg total) by mouth daily. 07/07/17   Georgiana Shore, NP  sacubitril-valsartan (ENTRESTO) 49-51 MG Take 1 tablet by mouth 2 (two) times daily. 10/21/17   Georgiana Shore, NP  torsemide (DEMADEX) 20 MG tablet Take 4 tablets (80 mg total) by mouth daily. 09/13/17   Elmarie Shiley, MD    Physical Exam: Vitals:   12/27/17 1930 12/27/17 2115 12/27/17 2117 12/28/17 0018  BP: (!) 142/92 (!) 139/122 (!) 124/98 (!) 127/91  Pulse: 98 97  (!) 105  Resp: (!) 27 (!) 22  18  Temp:  98.7 F (37.1 C)  98.4 F (36.9 C)  TempSrc:  Oral  Oral  SpO2: 96% 99%  98%  Weight:      Height:        Physical Exam  Constitutional: He is oriented to person, place, and time. He appears well-developed and well-nourished. No distress.  Sitting up at the site of the bed  HENT:  Head: Normocephalic.  Mouth/Throat: Oropharynx is clear and moist.  Eyes: Right eye exhibits no discharge. Left eye exhibits no discharge.  Neck: Neck supple.  Cardiovascular: Normal rate, regular rhythm and intact distal pulses.  Pulmonary/Chest: Effort normal. He has no wheezes. He has rales.  On 2 L supplemental oxygen via nasal cannula Bibasilar rales  Abdominal: Soft. Bowel sounds are normal. He exhibits distension. There is no tenderness. There is no guarding.  Musculoskeletal: He exhibits edema.  +2 pitting edema of bilateral lower extremities  Neurological: He is alert and oriented to person, place, and time.  Skin: Skin is warm and dry. He is not diaphoretic.  Venous stasis dermatitis of bilateral lower extremities  Psychiatric: His behavior is normal.     Labs on Admission: I have personally reviewed following labs and imaging studies  CBC: Recent Labs  Lab 12/27/17 1726  WBC 6.7  NEUTROABS 4.6  HGB 13.0  HCT 42.9  MCV 92.9  PLT 269   Basic Metabolic Panel: Recent Labs  Lab 12/27/17 1726  NA 138  K 4.3  CL 104  CO2 25  GLUCOSE 158*  BUN 13   CREATININE 1.33*  CALCIUM 10.3   GFR: Estimated Creatinine Clearance: 78.6 mL/min (A) (by C-G formula based on SCr of 1.33 mg/dL (H)).  Liver Function Tests: Recent Labs  Lab 12/27/17 1726  AST 56*  ALT 69*  ALKPHOS 110  BILITOT 0.8  PROT 7.4  ALBUMIN 3.8   No results for input(s): LIPASE, AMYLASE in the last 168 hours. No results for input(s): AMMONIA in the last 168 hours. Coagulation Profile: No results for input(s): INR, PROTIME in the last 168 hours. Cardiac Enzymes: Recent Labs  Lab 12/27/17 2159  TROPONINI 0.08*   BNP (last 3 results) No results for input(s): PROBNP in the last 8760 hours. HbA1C: No results for input(s): HGBA1C in the last 72 hours. CBG: Recent Labs  Lab 12/28/17 0015  GLUCAP 190*   Lipid Profile: No results for input(s): CHOL, HDL, LDLCALC, TRIG, CHOLHDL, LDLDIRECT in the last 72 hours. Thyroid Function Tests: No results for input(s): TSH, T4TOTAL, FREET4, T3FREE, THYROIDAB in the last 72 hours. Anemia Panel: No results for input(s): VITAMINB12, FOLATE, FERRITIN, TIBC, IRON, RETICCTPCT in the last 72 hours. Urine analysis:    Component Value Date/Time   COLORURINE YELLOW 10/22/2017 [place] 10/22/2017 1403   LABSPEC 1.009 10/22/2017 1403   PHURINE 5.0 10/22/2017 1403   GLUCOSEU NEGATIVE 10/22/2017 1403   HGBUR NEGATIVE 10/22/2017 1403   BILIRUBINUR NEGATIVE 10/22/2017 1403   KETONESUR NEGATIVE 10/22/2017 1403   PROTEINUR 30 (A) 10/22/2017 1403   UROBILINOGEN 1.0 05/17/2014 1925   NITRITE NEGATIVE 10/22/2017 1403   LEUKOCYTESUR NEGATIVE 10/22/2017 1403    Radiological Exams on Admission: Dg Chest Port 1 View  Result Date: 12/27/2017 CLINICAL DATA:  Shortness of breath.  History of CHF. EXAM: PORTABLE CHEST 1 VIEW COMPARISON:  Chest radiograph 10/22/2017. FINDINGS: Monitoring leads overlie the patient. Stable cardiomegaly. Interval development of bilateral heterogeneous pulmonary opacities within the mid and lower  lungs. Small left pleural effusion. IMPRESSION: New bilateral heterogeneous pulmonary opacities may represent pulmonary edema in the appropriate clinical setting. Small left pleural effusion. Superimposed infection not excluded. Electronically Signed   By: Lovey Newcomer M.D.   On: 12/27/2017 17:49    EKG: Independently reviewed.  Sinus tachycardia (heart rate 103) with first-degree AV block, left posterior fascicular block.   Assessment/Plan Principal Problem:   Acute exacerbation of CHF (congestive heart failure) (HCC) Active Problems:   HLD (hyperlipidemia)   HTN (hypertension)   Transaminitis   CKD (chronic kidney disease) stage 2, GFR 60-89 ml/min   Alcohol abuse   Schizophrenia (HCC)   AF (paroxysmal atrial fibrillation) (HCC)   Type 2 diabetes mellitus (HCC)   Chronic combined systolic (congestive) and diastolic (congestive) heart failure (HCC)   Elevated troponin   Acute on chronic respiratory failure with hypoxia (HCC)   Gout   1st degree AV block   Physical deconditioning   Acute hypoxic respiratory failure secondary to acute exacerbation of chronic combined systolic and diastolic congestive heart failure Likely secondary to dietary indiscretions (increased sodium intake recently) and digoxin non-compliance (digoxin level subtherapeutic <0.2).  Echo done in August 2019 showing EF 25 to 30%. Patient is presenting with a 3-day history of dyspnea on exertion, orthopnea, paroxysmal nocturnal dyspnea, cough, and abdominal tightness.  Currently on 2 L supplemental oxygen.  BNP 357, elevated from baseline. Chest x-ray showing new bilateral heterogenous pulmonary opacities which may represent pulmonary edema and small left pleural effusion; superimposed infection not excluded.  Pneumonia less likely as patient is afebrile and does not have leukocytosis.  Procalcitonin level normal.  Patient received IV Lasix 60 mg once in the ED. -Continue IV Lasix 60 mg twice daily -Continue  home Coreg  3.125 mg twice daily -Continue home digoxin 0.125 mg daily -Monitor intake and output -Daily weights -Hold home eplerenone, sacubitril-valsartan, and torsemide at this time.  Continue to monitor renal function with IV diuresis.  Troponinemia Troponin 0.13 >0.08.  EKG not suggestive of ACS.  Patient denies having any chest pain.  Mild troponin elevation likely due to demand ischemia from acute exacerbation of CHF. -Continue to trend troponin -Continue management for CHF exacerbation as above  Uncontrolled type 2 diabetes Blood glucose 158 on admission.  A1c 8.3 in August 2019. -Recheck A1c -Sliding scale insulin sensitive -CBG checks -Hold home metformin  CKD 2 -Creatinine 1.3, was 1.2 eleven days ago.  -Continue to monitor renal function with IV diuresis  Paroxysmal atrial fibrillation CHA2DS2VAsc 6.  Currently in sinus rhythm. -Continue Xarelto, Coreg  Gout -Continue allopurinol  Transaminitis Patient has a history of hepatic steatosis secondary to alcohol abuse.  LFTs mildly elevated (AST 56, ALT 69), previously elevated as well.  Alk phos and T bili levels normal. -Outpatient follow-up with PCP  History of alcohol abuse Reports drinking alcohol once a month, 3 bottles at a time.  States he does not drink alcohol on a regular basis. -CIWA protocol; Ativan PRN -Thiamine, folate, multivitamin  Schizophrenia -Continue home quetiapine  Hypertension Currently normotensive. -IV diuresis as above -Continue home Coreg  Hyperlipidemia -Continue home Crestor  EKG with first-degree AV block Seen on prior tracing from November as well. -Check TSH level  Physical deconditioning -PT evaluation  DVT prophylaxis: Lovenox Code Status: Patient wishes to be full code. Family Communication: No family available Disposition Plan: Anticipate discharge to home in 2 to 3 days. Consults called: None Admission status: It is my clinical opinion that admission to INPATIENT is  reasonable and necessary in this 62 y.o. male . presenting with symptoms of dyspnea on exertion, orthopnea, paroxysmal nocturnal dyspnea, cough, and abdominal tightness., concerning for acute exacerbation of chronic systolic congestive heart failure . in the context of PMH including: Chronic combined systolic and diastolic congestive heart failure . with pertinent positives on physical exam including: Supplemental oxygen requirement, abdominal distention, +2 pitting edema of bilateral lower extremities, bibasilar rales . and pertinent positives on radiographic and laboratory data including: Elevated BNP, chest x-ray with evidence of pulmonary edema . Workup and treatment include IV diuresis, monitor renal function.  Given the aforementioned, the predictability of an adverse outcome is felt to be significant. I expect that the patient will require at least 2 midnights in the hospital to treat this condition.    Shela Leff MD [facility] Pager [phone]  If 7PM-7AM, please contact night-coverage [email or web address] Password [email or web address]  12/28/2017, 2:17 AM

## 2017-12-27 NOTE — ED Notes (Signed)
Patient unable to tolerate sitting with feet up on bed at 90 degree angle. Requested to sit on side of bed. Pt tolerating this new position better and states it is easier to breathe sitting on side of bed.

## 2017-12-27 NOTE — Progress Notes (Signed)
ANTICOAGULATION CONSULT NOTE - Initial Consult  Pharmacy Consult for xarelto Indication: atrial fibrillation  Allergies  Allergen Reactions  . Heparin Other (See Comments)    Documented HIT under problem list Pt states he's not allergic. " They told me not take it anymore"  . Spironolactone Other (See Comments)    Breast tenderness  . Thorazine [Chlorpromazine Hcl] Other (See Comments)    Body freezes up    Patient Measurements: Height: '5\' 11"'$  (180.3 cm) Weight: 290 lb (131.5 kg) IBW/kg (Calculated) : 75.3  Vital Signs: Temp: 98.7 F (37.1 C) (12/02 2115) Temp Source: Oral (12/02 2115) BP: 124/98 (12/02 2117) Pulse Rate: 97 (12/02 2115)  Labs: Recent Labs    12/27/17 1726  HGB 13.0  HCT 42.9  PLT 172  CREATININE 1.33*    Estimated Creatinine Clearance: 78.6 mL/min (A) (by C-G formula based on SCr of 1.33 mg/dL (H)).   Medical History: Past Medical History:  Diagnosis Date  . Atrial fibrillation ([place])   . Cancer [facility])    Prostate cancer-bx. 3 weeks ago  . Cataract   . Chronic combined systolic and diastolic CHF (congestive heart failure) (HCC)    a) EF 40-45% per 2D echo (02/2012) with grade 1 DD b)  NICM c) RHC (04/2012): RA: 4, RV 45/3/4, PA 42/9 (24), PCWP 14, Fick CO/CI: 5.2 /2.2, PVR 1.9 WU, PA 62% and 64% d) ECHO (10/2012) EF 40-45%, grade II DD, RV nl  . CKD (chronic kidney disease) stage 2, GFR 60-89 ml/min    BL SCr approximately 1-1.3  . Colitis 05/2009   History of colitis of ascending colon noted on CT abd/pelvis (05/2009), with interval resolution with subsequent CT  . Continuous chronic alcoholism ([place])   . Degenerative lumbar spinal stenosis    s/p L2-3, L3-4, L4-5 laminectomy partial facetectomy, and bilateral foraminotomy  . Diabetes mellitus without complication ([place])    Type II  . Dysrhythmia    A. Fib  . Family history of adverse reaction to anesthesia    "sister, can't go to sleep"  . GERD (gastroesophageal reflux disease)   . H/O  cocaine abuse ([place])   . Hepatic steatosis    suspected 2/2 alcohol abuse  . Hepatitis C   . History of pancreatitis 01/2011   Admission for acute pancreatitis presumed 2/2 ongoing alcohol abuse- and hypertriglyceridemia  . History of pneumonia   . HIT (heparin-induced thrombocytopenia) ([place])   . Hyperlipidemia   . Hypertension    uncontrolled with medication noncompliance  . Hypertriglyceridemia   . NICM (nonischemic cardiomyopathy) ([place])    a. LHC (04/2012): nl arteries  . PFO (patent foramen ovale) 01/2012   with right to left shunt, noted per TEE in evaluation for source of embolic stroke in 01/6107  . Rhabdomyolysis 02/22/2012   H/O rhabdomyolysis in 01/2012 that was idiopathic, cause never identified  . Schizophrenia, schizo-affective ([place])   . Shortness of breath dyspnea   . Splenic cyst   . Stroke ([place]) 01/2012   Small cerebellar infarcts right greater than left as well as questionable acute left external capsule and caudate nuclear punctate lacunar infarcts noted per MRI (01/2012) - presumed to be embolic likely source PFO with right to left shunt (noted per TEE 01/ 2014)    Medications:  Medications Prior to Admission  Medication Sig Dispense Refill Last Dose  . rivaroxaban (XARELTO) 20 MG TABS tablet Take 1 tablet (20 mg total) by mouth daily. 30 tablet 6 12/27/2017 at Unknown time  . ACCU-CHEK  FASTCLIX LANCETS MISC Check blood sugar 4 times a day before meals and bedtime 204 each 5 Taking  . allopurinol (ZYLOPRIM) 100 MG tablet Take 1 tablet (100 mg total) by mouth daily. 30 tablet 0 Taking  . B-D UF III MINI PEN NEEDLES 31G X 5 MM MISC Use four times daily as directed. Dx code E11.00, Z79.4 100 each 3 Taking  . Blood Glucose Monitoring Suppl (ACCU-CHEK GUIDE) w/Device KIT 1 each by Does not apply route 4 (four) times daily. 1 kit 1 Taking  . carvedilol (COREG) 3.125 MG tablet Take 1 tablet (3.125 mg total) by mouth 2 (two) times daily with a meal. 60 tablet 6 Taking  .  cyclobenzaprine (FLEXERIL) 10 MG tablet Take 10 mg by mouth 3 (three) times daily as needed for muscle spasms.  0 Taking  . digoxin (LANOXIN) 0.125 MG tablet Take 1 tablet (0.125 mg total) by mouth daily. 30 tablet 0 Taking  . eplerenone (INSPRA) 25 MG tablet Take 1 tablet (25 mg total) by mouth daily. 30 tablet 6 Taking  . glucose blood (ACCU-CHEK GUIDE) test strip Check blood sugar 4 times a day before meal and bedtime 150 each 5 Taking  . Insulin Degludec (TRESIBA FLEXTOUCH) 200 UNIT/ML SOPN Inject into the skin.   Taking  . insulin regular (NOVOLIN R,HUMULIN R) 100 units/mL injection Inject 0.1 mLs (10 Units total) into the skin 3 (three) times daily before meals. 3 times with meals.Take 30 minutes before meal. 15 mL 1 Taking  . metFORMIN (GLUCOPHAGE) 500 MG tablet Take 500 mg by mouth 2 (two) times daily with a meal.   Taking  . QUEtiapine (SEROQUEL) 400 MG tablet Take 1 tablet (400 mg total) by mouth 2 (two) times daily for 21 days. (Patient taking differently: Take 800 mg by mouth at bedtime. ) 42 tablet 0 Taking  . rosuvastatin (CRESTOR) 40 MG tablet Take 1 tablet (40 mg total) by mouth daily. 90 tablet 3 Taking  . sacubitril-valsartan (ENTRESTO) 49-51 MG Take 1 tablet by mouth 2 (two) times daily. 60 tablet 3 Taking  . torsemide (DEMADEX) 20 MG tablet Take 4 tablets (80 mg total) by mouth daily. 90 tablet 6 Taking    Assessment: 62 yo man to continue xarelto for afib.  Per pt, last doe this morning Goal of Therapy:  therapeutic anticoagulation Monitor platelets by anticoagulation protocol: Yes   Plan:  Xarelto 20 mg po daily Monitor for bleeding complications  [name] [name] 12/27/2017,11:13 PM

## 2017-12-27 NOTE — ED Provider Notes (Signed)
[facility] EMERGENCY DEPARTMENT Provider Note   CSN: [id number] Arrival date & time: 12/27/17  1716     History   Chief Complaint No chief complaint on file.   HPI HENOK HEACOCK is a 62 y.o. male.  61 year old male with history of CHF presents with increasing lower extremity edema times several days with orthopnea and dyspnea on exertion.  Denies any chest pain or chest pressure.  No fever but has had some nonproductive cough.  Symptoms unresponsive to his home diuretics.  Called EMS and was transported here.     Past Medical History:  Diagnosis Date  . Atrial fibrillation ([place])   . Cancer [facility])    Prostate cancer-bx. 3 weeks ago  . Cataract   . Chronic combined systolic and diastolic CHF (congestive heart failure) (HCC)    a) EF 40-45% per 2D echo (02/2012) with grade 1 DD b)  NICM c) RHC (04/2012): RA: 4, RV 45/3/4, PA 42/9 (24), PCWP 14, Fick CO/CI: 5.2 /2.2, PVR 1.9 WU, PA 62% and 64% d) ECHO (10/2012) EF 40-45%, grade II DD, RV nl  . CKD (chronic kidney disease) stage 2, GFR 60-89 ml/min    BL SCr approximately 1-1.3  . Colitis 05/2009   History of colitis of ascending colon noted on CT abd/pelvis (05/2009), with interval resolution with subsequent CT  . Continuous chronic alcoholism ([place])   . Degenerative lumbar spinal stenosis    s/p L2-3, L3-4, L4-5 laminectomy partial facetectomy, and bilateral foraminotomy  . Diabetes mellitus without complication ([place])    Type II  . Dysrhythmia    A. Fib  . Family history of adverse reaction to anesthesia    "sister, can't go to sleep"  . GERD (gastroesophageal reflux disease)   . H/O cocaine abuse ([place])   . Hepatic steatosis    suspected 2/2 alcohol abuse  . Hepatitis C   . History of pancreatitis 01/2011   Admission for acute pancreatitis presumed 2/2 ongoing alcohol abuse- and hypertriglyceridemia  . History of pneumonia   . HIT (heparin-induced thrombocytopenia) ([place])   . Hyperlipidemia   .  Hypertension    uncontrolled with medication noncompliance  . Hypertriglyceridemia   . NICM (nonischemic cardiomyopathy) ([place])    a. LHC (04/2012): nl arteries  . PFO (patent foramen ovale) 01/2012   with right to left shunt, noted per TEE in evaluation for source of embolic stroke in 04/979  . Rhabdomyolysis 02/22/2012   H/O rhabdomyolysis in 01/2012 that was idiopathic, cause never identified  . Schizophrenia, schizo-affective ([place])   . Shortness of breath dyspnea   . Splenic cyst   . Stroke ([place]) 01/2012   Small cerebellar infarcts right greater than left as well as questionable acute left external capsule and caudate nuclear punctate lacunar infarcts noted per MRI (01/2012) - presumed to be embolic likely source PFO with right to left shunt (noted per TEE 01/ 2014)    Patient Active Problem List   Diagnosis Date Noted  . Schizophrenia, schizo-affective ([place])   . Syncope, vasovagal 10/22/2017  . Syncope 10/22/2017  . Acute on chronic systolic heart failure ([place]) 09/07/2017  . Stasis dermatitis of both legs 09/07/2017  . Hypotension 05/31/2017  . DKA (diabetic ketoacidoses) ([place]) 05/27/2017  . Acute metabolic encephalopathy 19/14/7829  . CKD (chronic kidney disease), stage III ([place]) 05/27/2017  . Stroke ([place]) 05/27/2017  . Acute encephalopathy   . Chronic combined systolic (congestive) and diastolic (congestive) heart failure ([place]) 05/12/2017  . Elevated troponin 05/12/2017  .  Hyperosmolar non-ketotic state in patient with type 2 diabetes mellitus ([place]) 05/12/2017  . Dehydration 05/12/2017  . Nonketotic hyperglycinemia, type II ([place]) 05/12/2017  . AKI (acute kidney injury) ([place])   . Acute on chronic respiratory failure with hypoxia ([place])   . Chest pain 11/27/2016  . SOB (shortness of breath) 11/26/2016  . Diabetes ([place])   . Overgrown toenails 09/10/2015  . Heart failure with reduced ejection fraction, NYHA class III ([place]) 06/07/2015  . Hepatitis C antibody test positive  06/07/2015  . Paroxysmal atrial fibrillation ([place]) 06/07/2015  . Schizophrenia ([place]) 03/06/2014  . Alcohol abuse 09/20/2013  . Generalized weakness 08/04/2013  . Cocaine abuse ([place]) 08/01/2013  . Carpal tunnel syndrome 04/11/2013  . Chronic back pain greater than 3 months duration 07/19/2012  . Gastric ulcer with hemorrhage 06/25/2012  . CVA (cerebral infarction) 02/29/2012  . HTN (hypertension) 02/20/2012    Past Surgical History:  Procedure Laterality Date  . ACHILLES TENDON REPAIR Right 2007   "it was torn"  . BACK SURGERY    . CARDIAC CATHETERIZATION N/A 09/02/2015   Procedure: Right Heart Cath;  Surgeon: Jolaine Artist, MD;  Location: [facility];  Service: Cardiovascular;  Laterality: N/A;  . CATARACT EXTRACTION W/ INTRAOCULAR LENS IMPLANT Right   . ESOPHAGOGASTRODUODENOSCOPY N/A 06/25/2012   Procedure: ESOPHAGOGASTRODUODENOSCOPY (EGD);  Surgeon: Milus Banister, MD;  Location: [facility];  Service: Endoscopy;  Laterality: N/A;  . I&D EXTREMITY  03/20/2011   Procedure: IRRIGATION AND DEBRIDEMENT EXTREMITY;  Surgeon: Kerin Salen, MD;  Location: [facility];  Service: Orthopedics;  Laterality: Left;  I&D LEFT ACHILLIES TENDON  . KNEE ARTHROSCOPY Left   . LUMBAR LAMINECTOMY     L2-3, L3-4, L4-5 laminectomy, partial facetectomy  . RESECTION DISTAL CLAVICAL  09/17/2011   Procedure: RESECTION DISTAL CLAVICAL;  Surgeon: Nita Sells, MD;  Location: [facility];  Service: Orthopedics;  Laterality: Right;  right shoulder arthroscopy with sad and open distal clavicle excision   . ROBOT ASSISTED LAPAROSCOPIC RADICAL PROSTATECTOMY N/A 12/16/2012   Procedure: ROBOTIC ASSISTED LAPAROSCOPIC PROSTATECTOMY ;  Surgeon: Ardis Hughs, MD;  Location: [facility];  Service: Urology;  Laterality: N/A;  . TONSILLECTOMY          Home Medications    Prior to Admission medications   Medication Sig Start Date End Date Taking? Authorizing Provider  ACCU-CHEK FASTCLIX  LANCETS MISC Check blood sugar 4 times a day before meals and bedtime 02/16/17   [name], MD  allopurinol (ZYLOPRIM) 100 MG tablet Take 1 tablet (100 mg total) by mouth daily. 09/14/17   Regalado, Belkys A, MD  B-D UF III MINI PEN NEEDLES 31G X 5 MM MISC Use four times daily as directed. Dx code E11.00, Z79.4 12/23/16   Aldine Contes, MD  Blood Glucose Monitoring Suppl (ACCU-CHEK GUIDE) w/Device KIT 1 each by Does not apply route 4 (four) times daily. 02/16/17   [name], MD  carvedilol (COREG) 3.125 MG tablet Take 1 tablet (3.125 mg total) by mouth 2 (two) times daily with a meal. 06/03/17   Tillery, Satira Mccallum, PA-C  cyclobenzaprine (FLEXERIL) 10 MG tablet Take 10 mg by mouth 3 (three) times daily as needed for muscle spasms. 10/26/17   [provider]  digoxin (LANOXIN) 0.125 MG tablet Take 1 tablet (0.125 mg total) by mouth daily. 09/14/17   Regalado, Belkys A, MD  eplerenone (INSPRA) 25 MG tablet Take 1 tablet (25 mg total) by mouth daily. 06/23/17   Bensimhon,  Shaune Pascal, MD  glucose blood (ACCU-CHEK GUIDE) test strip Check blood sugar 4 times a day before meal and bedtime 02/16/17   [name], MD  Insulin Degludec (TRESIBA FLEXTOUCH) 200 UNIT/ML SOPN Inject into the skin.    [provider]  insulin regular (NOVOLIN R,HUMULIN R) 100 units/mL injection Inject 0.1 mLs (10 Units total) into the skin 3 (three) times daily before meals. 3 times with meals.Take 30 minutes before meal. 09/13/17   Regalado, Belkys A, MD  metFORMIN (GLUCOPHAGE) 500 MG tablet Take 500 mg by mouth 2 (two) times daily with a meal.    [provider]  QUEtiapine (SEROQUEL) 400 MG tablet Take 1 tablet (400 mg total) by mouth 2 (two) times daily for 21 days. Patient taking differently: Take 800 mg by mouth at bedtime.  06/03/17 01/21/18  Elwin Mocha, MD  rivaroxaban (XARELTO) 20 MG TABS tablet Take 1 tablet (20 mg total) by mouth daily. 06/04/17   Shirley Friar, PA-C    rosuvastatin (CRESTOR) 40 MG tablet Take 1 tablet (40 mg total) by mouth daily. 07/07/17   Georgiana Shore, NP  sacubitril-valsartan (ENTRESTO) 49-51 MG Take 1 tablet by mouth 2 (two) times daily. 10/21/17   Georgiana Shore, NP  torsemide (DEMADEX) 20 MG tablet Take 4 tablets (80 mg total) by mouth daily. 09/13/17   Regalado, Cassie Freer, MD    Family History Family History  Problem Relation Age of Onset  . CAD Mother 64       deceased  . CAD Sister   . CAD Brother 61       died from MI at age 71yo  . Hypertension Unknown     Social History Social History   Tobacco Use  . Smoking status: Former Smoker    Packs/day: 1.00    Years: 30.00    Pack years: 30.00    Types: Cigarettes    Last attempt to quit: 06/24/2001    Years since quitting: 16.5  . Smokeless tobacco: Never Used  Substance Use Topics  . Alcohol use: Yes    Alcohol/week: 6.0 standard drinks    Types: 6 Glasses of wine per week    Comment: occasinally  . Drug use: Not Currently    Types: "Crack" cocaine, Cocaine     Allergies   Heparin; Spironolactone; and Thorazine [chlorpromazine hcl]   Review of Systems Review of Systems  All other systems reviewed and are negative.    Physical Exam Updated Vital Signs Pulse (!) 104   Resp (!) 24   SpO2 100%   Physical Exam  Constitutional: He is oriented to person, place, and time. He appears well-developed and well-nourished.  Non-toxic appearance. No distress.  HENT:  Head: Normocephalic and atraumatic.  Eyes: Pupils are equal, round, and reactive to light. Conjunctivae, EOM and lids are normal.  Neck: Normal range of motion. Neck supple. No tracheal deviation present. No thyroid mass present.  Cardiovascular: Normal rate, regular rhythm and normal heart sounds. Exam reveals no gallop.  No murmur heard. Pulmonary/Chest: Tachypnea noted. He is in respiratory distress. He has decreased breath sounds in the right lower field and the left lower field. He has no  wheezes. He has rhonchi in the right lower field and the left lower field. He has no rales.  Abdominal: Soft. Normal appearance and bowel sounds are normal. He exhibits no distension. There is no tenderness. There is no rebound and no CVA tenderness.  Musculoskeletal: Normal range of motion. He  exhibits no edema or tenderness.  Lymphadenopathy:  3 plus bilateral lower extremity pitting edema  Neurological: He is alert and oriented to person, place, and time. He has normal strength. No cranial nerve deficit or sensory deficit. GCS eye subscore is 4. GCS verbal subscore is 5. GCS motor subscore is 6.  Skin: Skin is warm and dry. No abrasion and no rash noted.  Psychiatric: He has a normal mood and affect. His speech is normal and behavior is normal.  Nursing note and vitals reviewed.    ED Treatments / Results  Labs (all labs ordered are listed, but only abnormal results are displayed) Labs Reviewed  BRAIN NATRIURETIC PEPTIDE  CBC WITH DIFFERENTIAL/PLATELET  COMPREHENSIVE METABOLIC PANEL  DIGOXIN LEVEL  I-STAT TROPONIN, ED    EKG EKG Interpretation  Date/Time:  Monday December 27 2017 18:41:52 EST Ventricular Rate:  103 PR Interval:  216 QRS Duration: 106 QT Interval:  344 QTC Calculation: 450 R Axis:   163 Text Interpretation:  Sinus tachycardia with 1st degree A-V block Left posterior fascicular block Anterior infarct , age undetermined Abnormal ECG Confirmed by Lacretia Leigh ([id number]) on 12/27/2017 7:22:36 PM   Radiology No results found.  Procedures Procedures (including critical care time)  Medications Ordered in ED Medications  0.9 %  sodium chloride infusion (has no administration in time range)     Initial Impression / Assessment and Plan / ED Course  I have reviewed the triage vital signs and the nursing notes.  Pertinent labs & imaging results that were available during my care of the patient were reviewed by me and considered in my medical decision making  (see chart for details).     Patient with evidence of CHF on chest x-ray.  EKG without evidence of acute ischemic changes.  Patient given Lasix IV.  Will admit to the hospitalist service  CRITICAL CARE Performed by: Leota Jacobsen Total critical care time: 50 minutes Critical care time was exclusive of separately billable procedures and treating other patients. Critical care was necessary to treat or prevent imminent or life-threatening deterioration. Critical care was time spent personally by me on the following activities: development of treatment plan with patient and/or surrogate as well as nursing, discussions with consultants, evaluation of patient's response to treatment, examination of patient, obtaining history from patient or surrogate, ordering and performing treatments and interventions, ordering and review of laboratory studies, ordering and review of radiographic studies, pulse oximetry and re-evaluation of patient's condition.   Final Clinical Impressions(s) / ED Diagnoses   Final diagnoses:  None    ED Discharge Orders    None       Lacretia Leigh, MD 12/27/17 [phone]

## 2017-12-27 NOTE — ED Notes (Signed)
Pt O2 90% on RA. Placed on 2L O2. 95% on 2L. Will continue to monitor oxygenation status.

## 2017-12-27 NOTE — ED Triage Notes (Signed)
Pt here via [facility] from home c/o shortness of breath. Hx CHF and takes lasix regularly. EMS placed pt on NRB 15L O2. O2 100% on NRB. Pedal, abdominal, and arm edema noted.   EMS vitals BP 142/100 RR 24 CBG 165 HR 104

## 2017-12-28 DIAGNOSIS — M109 Gout, unspecified: Secondary | ICD-10-CM

## 2017-12-28 DIAGNOSIS — R5381 Other malaise: Secondary | ICD-10-CM

## 2017-12-28 DIAGNOSIS — Z794 Long term (current) use of insulin: Secondary | ICD-10-CM

## 2017-12-28 DIAGNOSIS — I159 Secondary hypertension, unspecified: Secondary | ICD-10-CM

## 2017-12-28 DIAGNOSIS — I440 Atrioventricular block, first degree: Secondary | ICD-10-CM

## 2017-12-28 DIAGNOSIS — E1169 Type 2 diabetes mellitus with other specified complication: Secondary | ICD-10-CM

## 2017-12-28 DIAGNOSIS — I5042 Chronic combined systolic (congestive) and diastolic (congestive) heart failure: Secondary | ICD-10-CM

## 2017-12-28 DIAGNOSIS — F209 Schizophrenia, unspecified: Secondary | ICD-10-CM

## 2017-12-28 DIAGNOSIS — E785 Hyperlipidemia, unspecified: Secondary | ICD-10-CM

## 2017-12-28 DIAGNOSIS — J9621 Acute and chronic respiratory failure with hypoxia: Secondary | ICD-10-CM

## 2017-12-28 LAB — BASIC METABOLIC PANEL
Anion gap: 11 (ref 5–15)
BUN: 15 mg/dL (ref 8–23)
CO2: 25 mmol/L (ref 22–32)
Calcium: 10.1 mg/dL (ref 8.9–10.3)
Chloride: 103 mmol/L (ref 98–111)
Creatinine, Ser: 1.11 mg/dL (ref 0.61–1.24)
GFR calc Af Amer: >60 mL/min (ref >60)
GFR calc non Af Amer: >60 mL/min (ref >60)
Glucose, Bld: 153 mg/dL — ABNORMAL HIGH (ref 70–99)
Potassium: 4 mmol/L (ref 3.5–5.1)
Sodium: 139 mmol/L (ref 135–145)

## 2017-12-28 LAB — GLUCOSE, CAPILLARY
GLUCOSE-CAPILLARY: 235 mg/dL — AB (ref 70–99)
Glucose-Capillary: 153 mg/dL — ABNORMAL HIGH (ref 70–99)
Glucose-Capillary: 180 mg/dL — ABNORMAL HIGH (ref 70–99)
Glucose-Capillary: 190 mg/dL — ABNORMAL HIGH (ref 70–99)
Glucose-Capillary: 200 mg/dL — ABNORMAL HIGH (ref 70–99)

## 2017-12-28 LAB — TROPONIN I
Troponin I: 0.08 ng/mL (ref <0.03)
Troponin I: 0.09 ng/mL (ref <0.03)

## 2017-12-28 LAB — PROCALCITONIN: Procalcitonin: <0.1 ng/mL

## 2017-12-28 LAB — TSH: TSH: 2.701 u[IU]/mL (ref 0.350–4.500)

## 2017-12-28 MED ORDER — FUROSEMIDE 10 MG/ML IJ SOLN
60.0000 mg | Freq: Two times a day (BID) | INTRAMUSCULAR | Status: DC
  Administered 2017-12-28 – 2017-12-31 (×6): 60 mg via INTRAVENOUS
  Filled 2017-12-28 (×6): qty 6

## 2017-12-28 MED ORDER — FUROSEMIDE 10 MG/ML IJ SOLN: 40.0000 mg | Freq: Two times a day (BID) | INTRAMUSCULAR | Status: DC

## 2017-12-28 NOTE — Progress Notes (Addendum)
Pt lost his IV access this afternoon, IV team was in bed side twice,  Will follow up with them for the next attempt, so lasix IV time is postponed for now until pt. Get the new IV.  Palma Holter, RN

## 2017-12-28 NOTE — Progress Notes (Signed)
PROGRESS NOTE  [name] [name]  UDJ:[id number] DOB: Sep 24, 1955 DOA: 12/27/2017 PCP: [name], MD   Brief Narrative: [name] [name] is a 62 y.o. male with medical history significant of A. fib, chronic combined systolic and diastolic congestive heart failure, CKD 2, prostate cancer, alcohol abuse, type 2 diabetes, hypertension, hyperlipidemia, CVA presenting to the hospital for evaluation of shortness of breath.  Patient reports 3-day history of dyspnea on exertion, orthopnea, paroxysmal nocturnal dyspnea, cough, and abdominal tightness.  No abdominal pain.  He is currently taking torsemide 80 mg daily.  Does state that he has been eating more salt this past week due to Thanksgiving.  Denies having any fevers, chills, or chest pain.  ED Course: Placed on 15 L oxygen via nonrebreather by EMS.  Oxygen saturation 90% on room air in the ED, placed on 2 L supplemental oxygen.  BNP pending.  No leukocytosis.  Troponin 0.13.  EKG not suggestive of ACS.  Chest x-ray showing new bilateral heterogenous pulmonary opacities which may represent pulmonary edema and small left pleural effusion.  He received IV Lasix 60 mg once in the ED and this was continued BID with improvement in oxygenation.    Assessment & Plan: Principal Problem:   Acute exacerbation of CHF (congestive heart failure) (HCC) Active Problems:   HLD (hyperlipidemia)   HTN (hypertension)   Transaminitis   CKD (chronic kidney disease) stage 2, GFR 60-89 ml/min   Alcohol abuse   Schizophrenia (HCC)   AF (paroxysmal atrial fibrillation) (HCC)   Type 2 diabetes mellitus (HCC)   Chronic combined systolic (congestive) and diastolic (congestive) heart failure (HCC)   Elevated troponin   Acute on chronic respiratory failure with hypoxia (HCC)   Gout   1st degree AV block   Physical deconditioning  Acute hypoxic respiratory failure due to acute on chronic combined HFrEF: Due to increased salt load over holidays, possibly  medication nonadherence as well with undetectable digoxin level. Aug 2019 EF 25-30%. BNP elevated, bilateral opacities on CXR without infectious symptoms, and continues to have crackles on exam. PCT undetectable.  - Continue lasix '60mg'$  IV BID. Creatinine improved, but will continue monitoring.  - Monitor I/O, daily weights.  - Continue home coreg 3.125 mg twice daily - Continue home digoxin 0.125 mg daily - Holding eplerenone, entresto due to need for aggressive diuresis. Plan to restart soon.   Troponin elevation: Mild, downward trend not consistent with ACS. Suspect demand ischemia in absence of chest pain.  - No further work up planned.   Uncontrolled type 2 diabetes: HbA1c 8.3%.  - Continue SSI, hold metformin   Stage II CKD: No AKI at this time.  - Monitor with diuresis.   Paroxysmal atrial fibrillation: Currently NSR.  - Continue xarelto for CHA2DS2-VASc of 6.  - Continue low dose coreg.  - Monitoring telemetry with acute heart failure.   Gout: Chronic, no flare.  - Continue allopurinol  Transaminitis: Patient has a history of hepatic steatosis secondary to alcohol abuse.  LFTs mildly elevated (AST 56, ALT 69), previously elevated as well.  Alk phos and T bili levels normal. - Outpatient follow-up with PCP  History of alcohol abuse: Denies recent abuse, no evidence of withdrawal at this time.  - DC CIWA, continue thiamine, folate, multivitamin  Schizophrenia: No acute psychosis. -Continue home quetiapine  Hypertension: Continuing coreg.  - RN reports hypotension, will hold PM dose of coreg and monitor to restart in AM.   Hyperlipidemia - Continue home crestor  1st degree AVB:  Chronic TSH wnl.  - Continue medications as above.  Physical deconditioning - PT evaluation, mobilize  DVT prophylaxis: Xarelto Code Status: Full Family Communication: None at bedside Disposition Plan: PT recommends return home once stable for discharge. If diuresis continues to  improve clinical status over next 24 hours, not complicated by hypotension anticipate DC 12/4.   Consultants:   None  Procedures:   None  Antimicrobials:  None   Subjective: He has continued to have shortness of breath with exertion and leg swelling and orthopnea. While improved, these are not symptoms he has at baseline. Tells me he could normally walk at least 3 city blocks without dyspnea, and got too short of breath to continue after walking with assistance to the door of his room today. He's also been lightheaded today when standing from seated position. Feels weak compared to normal.  Objective: Vitals:   12/28/17 0438 12/28/17 0600 12/28/17 0854 12/28/17 1212  BP: 94/81   100/74  Pulse: (!) 46 80 86 89  Resp: 18   17  Temp: (!) 97.5 F (36.4 C)   (!) 97.3 F (36.3 C)  TempSrc: Oral   Oral  SpO2: 100%   100%  Weight: 128 kg     Height:        Intake/Output Summary (Last 24 hours) at 12/28/2017 1554 Last data filed at 12/28/2017 9629 Gross per 24 hour  Intake 50 ml  Output 3375 ml  Net -3325 ml   Filed Weights   12/27/17 1731 12/28/17 0438  Weight: 131.5 kg 128 kg    Gen: Morbidly obese male in no distress  Pulm: Non-labored breathing supplemental oxygen at rest. Diminished with bibasilar rales. CV: Regular rate and rhythm. No murmur, rub, or gallop. Uncertain JVD, 2+ pitting pedal edema. GI: Abdomen soft, non-tender, non-distended, with normoactive bowel sounds. No organomegaly or masses felt. Ext: Warm, no deformities Skin: No rashes, lesions or ulcers. Flaking stasis dermatitis in legs. Neuro: Alert and oriented. No focal neurological deficits. Psych: Judgement and insight appear normal. Mood & affect appropriate.   Data Reviewed: I have personally reviewed following labs and imaging studies  CBC: Recent Labs  Lab 12/27/17 1726  WBC 6.7  NEUTROABS 4.6  HGB 13.0  HCT 42.9  MCV 92.9  PLT 528   Basic Metabolic Panel: Recent Labs  Lab  12/27/17 1726 12/28/17 0440  NA 138 139  K 4.3 4.0  CL 104 103  CO2 25 25  GLUCOSE 158* 153*  BUN 13 15  CREATININE 1.33* 1.11  CALCIUM 10.3 10.1   GFR: Estimated Creatinine Clearance: 92.9 mL/min (by C-G formula based on SCr of 1.11 mg/dL). Liver Function Tests: Recent Labs  Lab 12/27/17 1726  AST 56*  ALT 69*  ALKPHOS 110  BILITOT 0.8  PROT 7.4  ALBUMIN 3.8   No results for input(s): LIPASE, AMYLASE in the last 168 hours. No results for input(s): AMMONIA in the last 168 hours. Coagulation Profile: No results for input(s): INR, PROTIME in the last 168 hours. Cardiac Enzymes: Recent Labs  Lab 12/27/17 2159 12/28/17 0440 12/28/17 0829  TROPONINI 0.08* 0.08* 0.09*   BNP (last 3 results) No results for input(s): PROBNP in the last 8760 hours. HbA1C: No results for input(s): HGBA1C in the last 72 hours. CBG: Recent Labs  Lab 12/28/17 0015 12/28/17 0725 12/28/17 1211  GLUCAP 190* 153* 200*   Lipid Profile: No results for input(s): CHOL, HDL, LDLCALC, TRIG, CHOLHDL, LDLDIRECT in the last 72 hours. Thyroid Function Tests: Recent  Labs    12/28/17 0440  TSH 2.701   Anemia Panel: No results for input(s): VITAMINB12, FOLATE, FERRITIN, TIBC, IRON, RETICCTPCT in the last 72 hours. Urine analysis:    Component Value Date/Time   COLORURINE YELLOW 10/22/2017 [place] 10/22/2017 1403   LABSPEC 1.009 10/22/2017 1403   PHURINE 5.0 10/22/2017 1403   GLUCOSEU NEGATIVE 10/22/2017 1403   HGBUR NEGATIVE 10/22/2017 1403   BILIRUBINUR NEGATIVE 10/22/2017 1403   KETONESUR NEGATIVE 10/22/2017 1403   PROTEINUR 30 (A) 10/22/2017 1403   UROBILINOGEN 1.0 05/17/2014 1925   NITRITE NEGATIVE 10/22/2017 1403   LEUKOCYTESUR NEGATIVE 10/22/2017 1403   No results found for this or any previous visit (from the past 240 hour(s)).    Radiology Studies: Dg Chest Port 1 View  Result Date: 12/27/2017 CLINICAL DATA:  Shortness of breath.  History of CHF. EXAM:  PORTABLE CHEST 1 VIEW COMPARISON:  Chest radiograph 10/22/2017. FINDINGS: Monitoring leads overlie the patient. Stable cardiomegaly. Interval development of bilateral heterogeneous pulmonary opacities within the mid and lower lungs. Small left pleural effusion. IMPRESSION: New bilateral heterogeneous pulmonary opacities may represent pulmonary edema in the appropriate clinical setting. Small left pleural effusion. Superimposed infection not excluded. Electronically Signed   By: Lovey Newcomer M.D.   On: 12/27/2017 17:49    Scheduled Meds: . allopurinol  100 mg Oral Daily  . digoxin  0.125 mg Oral Daily  . folic acid  1 mg Oral Daily  . furosemide  40 mg Intravenous BID  . insulin aspart  0-9 Units Subcutaneous TID WC  . multivitamin with minerals  1 tablet Oral Daily  . QUEtiapine  400 mg Oral BID  . rivaroxaban  20 mg Oral Daily  . rosuvastatin  40 mg Oral Daily  . thiamine  100 mg Oral Daily   Or  . thiamine  100 mg Intravenous Daily   Continuous Infusions: . sodium chloride 10 mL/hr at 12/27/17 1844     LOS: 1 day   Time spent: 25 minutes.  Patrecia Pour, MD [facility] [email or web address] Password [facility] 12/28/2017, 3:54 PM

## 2017-12-28 NOTE — Evaluation (Signed)
Physical Therapy Evaluation Patient Details Name: [name] [name] MRN: [id number] DOB: 1955/03/16 Today's Date: 12/28/2017   History of Present Illness  Pt is a 62 y.o. male admitted 12/27/17 with SOB; worked up for CHF exacerbation likely secondary to increased sodium intake and digoxin non-compliance. PMH includes a-fib, CHF, CKD2, alcohol abuse, DM2, HTN, CVA (09/2017), schizophrenia.    Clinical Impression  Pt presents with an overall decrease in functional mobility secondary to above. PTA, pt mod indep ambulating with SPC/RW; has aide and family assist for ADLs, household tasks and transportation. Today, pt ambulatory with RW at supervision-level. Per RN, pt hypotensive earlier today; asymptomatic this session (see BP values below). SpO2 >92% on RA with mobility. Pt would benefit from continued acute PT services to maximize functional mobility and independence prior to d/c home.  Seated BP 110/76 Standing BP 110/75     Follow Up Recommendations No PT follow up;Supervision - Intermittent    Equipment Recommendations  None recommended by PT    Recommendations for Other Services       Precautions / Restrictions Precautions Precautions: Fall Restrictions Weight Bearing Restrictions: No      Mobility  Bed Mobility Overal bed mobility: Modified Independent             General bed mobility comments: HOB slightly elevated. Sitting EOB upon arrival  Transfers Overall transfer level: Needs assistance Equipment used: Rolling walker (2 wheeled) Transfers: Sit to/from Stand Sit to Stand: Supervision         General transfer comment: Reliant on momentum and UE support to power into standing; no physical assist required  Ambulation/Gait Ambulation/Gait assistance: Supervision Gait Distance (Feet): 200 Feet Assistive device: Rolling walker (2 wheeled) Gait Pattern/deviations: Step-through pattern;Decreased stride length;Decreased dorsiflexion - left;Trunk flexed;Wide base  of support Gait velocity: Decreased Gait velocity interpretation: 1.31 - 2.62 ft/sec, indicative of limited community ambulator General Gait Details: Slow, steady amb with RW and supervision; pt with baseline out-toeing and RW slightly too narrow to keep hips inside width, required intermittent cues to keep LLE inside RW especially with turns  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[employer] (Stroke Patients Only)       Balance Overall balance assessment: Needs assistance   Sitting balance-Leahy Scale: Good       Standing balance-Leahy Scale: Fair Standing balance comment: Can static stand without UE support                             Pertinent Vitals/Pain Pain Assessment: No/denies pain    Home Living Family/patient expects to be discharged to:: Private residence Living Arrangements: Alone Available Help at Discharge: Family;Personal care attendant;Available PRN/intermittently Type of Home: Apartment Home Access: Stairs to enter Entrance Stairs-Rails: Right;Left;Can reach both Entrance Stairs-Number of Steps: 3 Home Layout: One level Home Equipment: Cane - single point;Grab bars - tub/shower;Walker - 2 wheels Additional Comments: Aide assists 3-4 hrs/day, 5 days/wk    Prior Function Level of Independence: Needs assistance   Gait / Transfers Assistance Needed: Mod indep with use of SPC for household ambulation; community ambulation with RW. Does not drive  ADL's / Homemaking Assistance Needed: Aide assists with bathing and household tasks. Aide or sister assists with driving/errands        Hand Dominance   Dominant Hand: Right    Extremity/Trunk Assessment   Upper Extremity Assessment Upper Extremity Assessment: Overall WFL for tasks  assessed    Lower Extremity Assessment Lower Extremity Assessment: Overall WFL for tasks assessed(baseline out-toed gait)       Communication   Communication: No difficulties  Cognition  Arousal/Alertness: Awake/alert Behavior During Therapy: WFL for tasks assessed/performed Overall Cognitive Status: Within Functional Limits for tasks assessed                                 General Comments: WFL for simple tasks      General Comments General comments (skin integrity, edema, etc.): SpO2 >92% on RA. Seated BP 110/76, standing BP 110/75; pt asymptomatic    Exercises     Assessment/Plan    PT Assessment Patient needs continued PT services  PT Problem List Decreased activity tolerance;Decreased balance;Decreased mobility;Decreased knowledge of use of DME;Cardiopulmonary status limiting activity       PT Treatment Interventions DME instruction;Gait training;Stair training;Functional mobility training;Therapeutic activities;Therapeutic exercise;Balance training;Patient/family education    PT Goals (Current goals can be found in the Care Plan section)  Acute Rehab PT Goals Patient Stated Goal: Return home with continued assist from aide PT Goal Formulation: With patient Time For Goal Achievement: 01/11/18 Potential to Achieve Goals: Good    Frequency Min 3X/week   Barriers to discharge        Co-evaluation               AM-PAC PT "6 Clicks" Mobility  Outcome Measure Help needed turning from your back to your side while in a flat bed without using bedrails?: None Help needed moving from lying on your back to sitting on the side of a flat bed without using bedrails?: None Help needed moving to and from a bed to a chair (including a wheelchair)?: A Little Help needed standing up from a chair using your arms (e.g., wheelchair or bedside chair)?: A Little Help needed to walk in hospital room?: A Little Help needed climbing 3-5 steps with a railing? : A Little 6 Click Score: 20    End of Session Equipment Utilized During Treatment: Gait belt Activity Tolerance: Patient tolerated treatment well Patient left: in chair;with call bell/phone  within reach;with chair alarm set Nurse Communication: Mobility status PT Visit Diagnosis: Other abnormalities of gait and mobility (R26.89)    Time: 1430-1451 PT Time Calculation (min) (ACUTE ONLY): 21 min   Charges:   PT Evaluation $PT Eval Moderate Complexity: [place], PT, DPT Acute Rehabilitation Services  Pager [phone] Office [place] 12/28/2017, 3:49 PM

## 2017-12-29 DIAGNOSIS — I480 Paroxysmal atrial fibrillation: Secondary | ICD-10-CM

## 2017-12-29 DIAGNOSIS — J9621 Acute and chronic respiratory failure with hypoxia: Secondary | ICD-10-CM

## 2017-12-29 DIAGNOSIS — F1010 Alcohol abuse, uncomplicated: Secondary | ICD-10-CM

## 2017-12-29 LAB — BASIC METABOLIC PANEL
Anion gap: 12 (ref 5–15)
BUN: 17 mg/dL (ref 8–23)
CO2: 28 mmol/L (ref 22–32)
Calcium: 10.2 mg/dL (ref 8.9–10.3)
Chloride: 97 mmol/L — ABNORMAL LOW (ref 98–111)
Creatinine, Ser: 1.29 mg/dL — ABNORMAL HIGH (ref 0.61–1.24)
GFR calc Af Amer: >60 mL/min (ref >60)
GFR calc non Af Amer: 59 mL/min — ABNORMAL LOW (ref >60)
Glucose, Bld: 193 mg/dL — ABNORMAL HIGH (ref 70–99)
Potassium: 3.9 mmol/L (ref 3.5–5.1)
Sodium: 137 mmol/L (ref 135–145)

## 2017-12-29 LAB — GLUCOSE, CAPILLARY
GLUCOSE-CAPILLARY: 152 mg/dL — AB (ref 70–99)
GLUCOSE-CAPILLARY: 255 mg/dL — AB (ref 70–99)
GLUCOSE-CAPILLARY: 188 mg/dL — AB (ref 70–99)
Glucose-Capillary: 238 mg/dL — ABNORMAL HIGH (ref 70–99)

## 2017-12-29 MED ORDER — INSULIN ASPART 100 UNIT/ML ~~LOC~~ SOLN
3.0000 [IU] | Freq: Three times a day (TID) | SUBCUTANEOUS | Status: DC
  Administered 2017-12-29 – 2017-12-30 (×4): 3 [IU] via SUBCUTANEOUS

## 2017-12-29 NOTE — Progress Notes (Addendum)
Inpatient Diabetes Program Recommendations  AACE/ADA: New Consensus Statement on Inpatient Glycemic Control (2015)  Target Ranges:  Prepandial:   less than 140 mg/dL      Peak postprandial:   less than 180 mg/dL (1-2 hours)      Critically ill patients:  140 - 180 mg/dL   Lab Results  Component Value Date   GLUCAP 255 (H) 12/29/2017   HGBA1C 8.3 (H) 09/07/2017     Results for [name] [name], [name] [name] (MRN [id number]) as of 12/29/2017 13:16  Ref. Range 12/28/2017 21:03 12/29/2017 07:35 12/29/2017 11:21  Glucose-Capillary Latest Ref Range: 70 - 99 mg/dL 235 (H) 152 (H) 255 (H)    DM2  Home DM meds: Tresiba 0.5 mg weekly                              Novolog 10 units tid ac                              Glucophage 500 mg daily  Current DM meds:  Novolog (0-9 units) tid ac     Patient eating 100% of meals.     MD please consider the following insulin recommendations:   Add Novolog 3 units meal coverage tid ac. (Please add the following hold parameters:  Hold if NPO , Hold if patient eats less than 50% of meal.)     -- Will follow during hospitalization.--  Jonna Clark RN, MSN Diabetes Coordinator Inpatient Glycemic Control Team Team Pager: [phone] (8am-5pm)

## 2017-12-29 NOTE — Progress Notes (Signed)
PROGRESS NOTE    [name] [name]  MRN:[id number] DOB: 11/11/1955 DOA: 12/27/2017 PCP: Varadarajan, [name], MD    Brief Narrative:  62 y.o. male with medical history significant ofA. fib, chronic combined systolic and diastolic congestive heart failure, CKD 2, prostate cancer, alcoholabuse, type 2 diabetes, hypertension, hyperlipidemia, CVA presenting to the hospital for evaluation of shortness of breath.Patient reports 3-day history of dyspnea on exertion, orthopnea, paroxysmal nocturnal dyspnea, cough, and abdominal tightness. No abdominal pain. He is currently taking torsemide 80 mg daily. Does state that he has been eating more salt this past week due to Thanksgiving. Denies having any fevers, chills, or chest pain.  ED Course:Placed on 15 L oxygen via nonrebreather by EMS. Oxygen saturation 90% on room air in the ED, placed on 2 L supplemental oxygen. BNP pending. No leukocytosis. Troponin 0.13. EKG not suggestive of ACS. Chest x-ray showing new bilateral heterogenous pulmonary opacities which may represent pulmonary edema and small left pleural effusion.He received IV Lasix 60 mg once in the ED and this was continued BID with improvement in oxygenation.  Assessment & Plan:   Principal Problem:   Acute exacerbation of CHF (congestive heart failure) (HCC) Active Problems:   HLD (hyperlipidemia)   HTN (hypertension)   Transaminitis   CKD (chronic kidney disease) stage 2, GFR 60-89 ml/min   Alcohol abuse   Schizophrenia (HCC)   AF (paroxysmal atrial fibrillation) (HCC)   Type 2 diabetes mellitus (HCC)   Chronic combined systolic (congestive) and diastolic (congestive) heart failure (HCC)   Elevated troponin   Acute on chronic respiratory failure with hypoxia (HCC)   Gout   1st degree AV block   Physical deconditioning  Acute hypoxic respiratory failure due to acute on chronic combined HFrEF: Due to increased salt load over holidays, possibly medication  nonadherence as well with undetectable digoxin level. Aug 2019 EF 25-30%. BNP elevated, bilateral opacities on CXR without infectious symptoms, and continues to have crackles on exam. PCT undetectable.  - Tolerating lasix 60mg IV BID -still with some increased work of breathing and continued O2 requirements suggesting continued volume overload - Monitor I/O, daily weights.  - Continue home coreg 3.125 mg twice daily - Continue home digoxin 0.125 mg daily - Holding eplerenone, entresto due to need for aggressive diuresis.  Troponin elevation: Mild, downward trend not consistent with ACS. Suspect demand ischemia in absence of chest pain.  - No chest pain this AM  Uncontrolled type 2 diabetes: HbA1c 8.3%.  - Continue SSI, hold metformin  -Have added meal coverage of 3units novolog  Stage II CKD: No AKI at this time.  - Cr slightly higher today - Continue lasix as per above -Repeat bmet in AM  Paroxysmal atrial fibrillation: Currently NSR.  - Continue xarelto for CHA2DS2-VASc of 6.  - Continue low dose coreg.  - Monitoring telemetry with acute heart failure.   Gout: Chronic, no flare.  - Continue allopurinol as tolerated -No evidence of flare this AM  Transaminitis: -Patient has a history of hepatic steatosis secondary to alcohol abuse.  LFTs mildly elevated (AST 56, ALT 69),previously elevated as well. Alk phos and T bili levels normal. - Recommend outpatient follow-up with PCP  History of alcohol abuse: Denies recent abuse, no evidence of withdrawal at this time.  - Continued on thiamine, folate, multivitamin  Schizophrenia: No acute psychosis. -Continue home quetiapine as tolerated  Hypertension: Previously on coreg.  - RN reports hypotension -Vitals reviewed. Sbp, around 100. Coreg currently on hold  Hyperlipidemia - Continue   home crestor as tolerated  1st degree AVB: Chronic TSH wnl.  - Continue medications as above.  Physical deconditioning - PT  consulted   DVT prophylaxis: xarelto Code Status: Full Family Communication: Pt in room, family not at bedside Disposition Plan: D/c when fully diuresed and off O2  Consultants:     Procedures:     Antimicrobials: Anti-infectives (From admission, onward)   None       Subjective: Feels tired and sleepy this AM  Objective: Vitals:   12/29/17 0020 12/29/17 0354 12/29/17 1038 12/29/17 1119  BP: 106/80 95/77  103/80  Pulse: 91 74 96 67  Resp: 18 16  (!) 22  Temp: 98.6 F (37 C) (!) 97.4 F (36.3 C)  98.1 F (36.7 C)  TempSrc: Oral Oral  Oral  SpO2: 99% 100%  (!) 76%  Weight:  126.6 kg    Height:        Intake/Output Summary (Last 24 hours) at 12/29/2017 1447 Last data filed at 12/29/2017 1332 Gross per 24 hour  Intake 1080 ml  Output 2225 ml  Net -1145 ml   Filed Weights   12/27/17 1731 12/28/17 0438 12/29/17 0354  Weight: 131.5 kg 128 kg 126.6 kg    Examination:  General exam: Appears calm and comfortable  Respiratory system: Clear to auscultation. Mildly increased resp effort Cardiovascular system: S1 & S2 heard, RRR Gastrointestinal system: Abdomen is nondistended, soft and nontender. No organomegaly or masses felt. Normal bowel sounds heard. Central nervous system: Alert and oriented. No focal neurological deficits. Extremities: Symmetric 5 x 5 power. BLE edema Skin: No rashes, lesions Psychiatry: Judgement and insight appear normal. Mood & affect appropriate.   Data Reviewed: I have personally reviewed following labs and imaging studies  CBC: Recent Labs  Lab 12/27/17 1726  WBC 6.7  NEUTROABS 4.6  HGB 13.0  HCT 42.9  MCV 92.9  PLT 696   Basic Metabolic Panel: Recent Labs  Lab 12/27/17 1726 12/28/17 0440 12/29/17 0346  NA 138 139 137  K 4.3 4.0 3.9  CL 104 103 97*  CO2 '25 25 28  '$ GLUCOSE 158* 153* 193*  BUN '13 15 17  '$ CREATININE 1.33* 1.11 1.29*  CALCIUM 10.3 10.1 10.2   GFR: Estimated Creatinine Clearance: 79.4 mL/min (A) (by  C-G formula based on SCr of 1.29 mg/dL (H)). Liver Function Tests: Recent Labs  Lab 12/27/17 1726  AST 56*  ALT 69*  ALKPHOS 110  BILITOT 0.8  PROT 7.4  ALBUMIN 3.8   No results for input(s): LIPASE, AMYLASE in the last 168 hours. No results for input(s): AMMONIA in the last 168 hours. Coagulation Profile: No results for input(s): INR, PROTIME in the last 168 hours. Cardiac Enzymes: Recent Labs  Lab 12/27/17 2159 12/28/17 0440 12/28/17 0829  TROPONINI 0.08* 0.08* 0.09*   BNP (last 3 results) No results for input(s): PROBNP in the last 8760 hours. HbA1C: No results for input(s): HGBA1C in the last 72 hours. CBG: Recent Labs  Lab 12/28/17 1211 12/28/17 1619 12/28/17 2103 12/29/17 0735 12/29/17 1121  GLUCAP 200* 180* 235* 152* 255*   Lipid Profile: No results for input(s): CHOL, HDL, LDLCALC, TRIG, CHOLHDL, LDLDIRECT in the last 72 hours. Thyroid Function Tests: Recent Labs    12/28/17 0440  TSH 2.701   Anemia Panel: No results for input(s): VITAMINB12, FOLATE, FERRITIN, TIBC, IRON, RETICCTPCT in the last 72 hours. Sepsis Labs: Recent Labs  Lab 12/27/17 2159  PROCALCITON <0.10    No results found for this or  any previous visit (from the past 240 hour(s)).   Radiology Studies: Dg Chest Port 1 View  Result Date: 12/27/2017 CLINICAL DATA:  Shortness of breath.  History of CHF. EXAM: PORTABLE CHEST 1 VIEW COMPARISON:  Chest radiograph 10/22/2017. FINDINGS: Monitoring leads overlie the patient. Stable cardiomegaly. Interval development of bilateral heterogeneous pulmonary opacities within the mid and lower lungs. Small left pleural effusion. IMPRESSION: New bilateral heterogeneous pulmonary opacities may represent pulmonary edema in the appropriate clinical setting. Small left pleural effusion. Superimposed infection not excluded. Electronically Signed   By: Lovey Newcomer M.D.   On: 12/27/2017 17:49    Scheduled Meds: . allopurinol  100 mg Oral Daily  . digoxin   0.125 mg Oral Daily  . folic acid  1 mg Oral Daily  . furosemide  60 mg Intravenous BID  . insulin aspart  0-9 Units Subcutaneous TID WC  . insulin aspart  3 Units Subcutaneous TID WC  . multivitamin with minerals  1 tablet Oral Daily  . QUEtiapine  400 mg Oral BID  . rivaroxaban  20 mg Oral Daily  . rosuvastatin  40 mg Oral Daily  . thiamine  100 mg Oral Daily   Or  . thiamine  100 mg Intravenous Daily   Continuous Infusions: . sodium chloride 10 mL/hr at 12/27/17 1844     LOS: 2 days   Marylu Lund, MD [facility] Pager On Amion  If 7PM-7AM, please contact night-coverage 12/29/2017, 2:47 PM

## 2017-12-30 DIAGNOSIS — R7989 Other specified abnormal findings of blood chemistry: Secondary | ICD-10-CM

## 2017-12-30 DIAGNOSIS — N182 Chronic kidney disease, stage 2 (mild): Secondary | ICD-10-CM

## 2017-12-30 DIAGNOSIS — I440 Atrioventricular block, first degree: Secondary | ICD-10-CM

## 2017-12-30 LAB — BASIC METABOLIC PANEL
Anion gap: 10 (ref 5–15)
BUN: 16 mg/dL (ref 8–23)
CHLORIDE: 97 mmol/L — AB (ref 98–111)
CO2: 29 mmol/L (ref 22–32)
Calcium: 10.1 mg/dL (ref 8.9–10.3)
Creatinine, Ser: 1.09 mg/dL (ref 0.61–1.24)
GFR calc Af Amer: >60 mL/min (ref >60)
GFR calc non Af Amer: >60 mL/min (ref >60)
Glucose, Bld: 183 mg/dL — ABNORMAL HIGH (ref 70–99)
Potassium: 5.7 mmol/L — ABNORMAL HIGH (ref 3.5–5.1)
Sodium: 136 mmol/L (ref 135–145)

## 2017-12-30 LAB — GLUCOSE, CAPILLARY
Glucose-Capillary: 232 mg/dL — ABNORMAL HIGH (ref 70–99)
Glucose-Capillary: 238 mg/dL — ABNORMAL HIGH (ref 70–99)
Glucose-Capillary: 205 mg/dL — ABNORMAL HIGH (ref 70–99)
Glucose-Capillary: 191 mg/dL — ABNORMAL HIGH (ref 70–99)

## 2017-12-30 NOTE — Progress Notes (Signed)
PROGRESS NOTE    [name] [name] Born  MRN:[id number] DOB: 07/18/1955 DOA: 12/27/2017 PCP: Varadarajan, [name], MD    Brief Narrative:  62 y.o. male with medical history significant ofRyan. fib, chronic combined systolic and diastolic congestive heart failure, CKD 2, prostate cancer, alcoholabuse, type 2 diabetes, hypertension, hyperlipidemia, CVA presenting to the hospital for evaluation of shortness of breath.Patient reports 3-day history of dyspnea on exertion, orthopnea, paroxysmal nocturnal dyspnea, cough, and abdominal tightness. No abdominal pain. He is currently taking torsemide 80 mg daily. Does state that he has been eating more salt this past week due to Thanksgiving. Denies having any fevers, chills, or chest pain.  ED Course:Placed on 15 L oxygen via nonrebreather by EMS. Oxygen saturation 90% on room air in the ED, placed on 2 L supplemental oxygen. BNP pending. No leukocytosis. Troponin 0.13. EKG not suggestive of ACS. Chest x-ray showing new bilateral heterogenous pulmonary opacities which may represent pulmonary edema and small left pleural effusion.He received IV Lasix 60 mg once in the ED and this was continued BID with improvement in oxygenation.  Assessment & Plan:   Principal Problem:   Acute exacerbation of CHF (congestive heart failure) (HCC) Active Problems:   HLD (hyperlipidemia)   HTN (hypertension)   Transaminitis   CKD (chronic kidney disease) stage 2, GFR 60-89 ml/min   Alcohol abuse   Schizophrenia (HCC)   AF (paroxysmal atrial fibrillation) (HCC)   Type 2 diabetes mellitus (HCC)   Chronic combined systolic (congestive) and diastolic (congestive) heart failure (HCC)   Elevated troponin   Acute on chronic respiratory failure with hypoxia (HCC)   Gout   1st degree AV block   Physical deconditioning  Acute hypoxic respiratory failure due to acute on chronic combined HFrEF: Due to increased salt load over holidays, possibly medication  nonadherence as well with undetectable digoxin level. Aug 2019 EF 25-30%. BNP elevated, bilateral opacities on CXR without infectious symptoms, and continues to have crackles on exam. PCT undetectable.  - Tolerating lasix 60mg IV BID -still with some increased work of breathing and continued O2 requirements suggesting continued volume overload - Monitor I/O, daily weights.  - Continue home coreg 3.125 mg twice daily - Continue home digoxin 0.125 mg daily - Holding eplerenone, entresto due to need for aggressive diuresis.  Troponin elevation: Mild, downward trend not consistent with ACS. Suspect demand ischemia in absence of chest pain.  - No chest pain this AM  Uncontrolled type 2 diabetes: HbA1c 8.3%.  - Continue SSI, hold metformin  -Have added meal coverage of 3units novolog  Stage II CKD: No AKI at this time.  - Cr slightly higher today - Continue lasix as per above -Repeat bmet in AM  Paroxysmal atrial fibrillation: Currently NSR.  - Continue xarelto for CHA2DS2-VASc of 6.  - Continue low dose coreg.  - Monitoring telemetry with acute heart failure.   Gout: Chronic, no flare.  - Continue allopurinol as tolerated -No evidence of flare this AM  Transaminitis: -Patient has [name] history of hepatic steatosis secondary to alcohol abuse.  LFTs mildly elevated (AST 56, ALT 69),previously elevated as well. Alk phos and T bili levels normal. - Recommend outpatient follow-up with PCP  History of alcohol abuse: Denies recent abuse, no evidence of withdrawal at this time.  - Continued on thiamine, folate, multivitamin  Schizophrenia: No acute psychosis. -Continue home quetiapine as tolerated  Hypertension: Previously on coreg.  - RN reports hypotension -Vitals reviewed. Sbp, around 100. Coreg currently on hold  Hyperlipidemia - Continue   home crestor as tolerated  1st degree AVB: Chronic TSH wnl.  - Continue medications as above.  Physical deconditioning - PT  consulted   DVT prophylaxis: xarelto Code Status: Full Family Communication: Pt in room, family not at bedside Disposition Plan: D/c when fully diuresed and off O2  Consultants:     Procedures:     Antimicrobials: Anti-infectives (From admission, onward)   None      Subjective: Reports feeling somewhat better however still swollen  Objective: Vitals:   12/29/17 2005 12/30/17 0317 12/30/17 0835 12/30/17 1239  BP: (!) 93/55 100/87 90/73 90/69  Pulse: 62 87 95 77  Resp: _0 Temp: 97.8 F (36.6 C) (!) 97 F (36.1 C) 98 F (36.7 C) (!) 97.5 F (36.4 C)  TempSrc: Oral  Oral Oral  SpO2: 100% 100% 100% 100%  Weight:  127.1 kg    Height:        Intake/Output Summary (Last 24 hours) at 12/30/2017 1537 Last data filed at 12/30/2017 1248 Gross per 24 hour  Intake 1481.17 ml  Output 2050 ml  Net -568.83 ml   Filed Weights   12/28/17 0438 12/29/17 0354 12/30/17 0317  Weight: 128 kg 126.6 kg 127.1 kg    Examination: General exam: Conversant, in no acute distress Respiratory system: normal chest rise, clear, no audible wheezing Cardiovascular system: regular rhythm, s1-s2 Gastrointestinal system: Nondistended, nontender, pos BS Central nervous system: No seizures, no tremors Extremities: No cyanosis, no joint deformities Skin: No rashes, no pallor, BLE edema Psychiatry: Affect normal // no auditory hallucinations    Data Reviewed: I have personally reviewed following labs and imaging studies  CBC: Recent Labs  Lab 12/27/17 1726  WBC 6.7  NEUTROABS 4.6  HGB 13.0  HCT 42.9  MCV 92.9  PLT 094   Basic Metabolic Panel: Recent Labs  Lab 12/27/17 1726 12/28/17 0440 12/29/17 0346 12/30/17 0450  NA 138 139 137 136  K 4.3 4.0 3.9 5.7*  CL 104 103 97* 97*  CO2 _1 GLUCOSE 158* 153* 193* 183*  BUN _2 CREATININE 1.33* 1.11 1.29* 1.09  CALCIUM 10.3 10.1 10.2 10.1   GFR: Estimated Creatinine Clearance: 94.2 mL/min (by C-G  formula based on SCr of 1.09 mg/dL). Liver Function Tests: Recent Labs  Lab 12/27/17 1726  AST 56*  ALT 69*  ALKPHOS 110  BILITOT 0.8  PROT 7.4  ALBUMIN 3.8   No results for input(s): LIPASE, AMYLASE in the last 168 hours. No results for input(s): AMMONIA in the last 168 hours. Coagulation Profile: No results for input(s): INR, PROTIME in the last 168 hours. Cardiac Enzymes: Recent Labs  Lab 12/27/17 2159 12/28/17 0440 12/28/17 0829  TROPONINI 0.08* 0.08* 0.09*   BNP (last 3 results) No results for input(s): PROBNP in the last 8760 hours. HbA1C: No results for input(s): HGBA1C in the last 72 hours. CBG: Recent Labs  Lab 12/29/17 1121 12/29/17 1641 12/29/17 2158 12/30/17 0720 12/30/17 1203  GLUCAP 255* 188* 238* 232* 238*   Lipid Profile: No results for input(s): CHOL, HDL, LDLCALC, TRIG, CHOLHDL, LDLDIRECT in the last 72 hours. Thyroid Function Tests: Recent Labs    12/28/17 0440  TSH 2.701   Anemia Panel: No results for input(s): VITAMINB12, FOLATE, FERRITIN, TIBC, IRON, RETICCTPCT in the last 72 hours. Sepsis Labs: Recent Labs  Lab 12/27/17 2159  PROCALCITON <0.10    No results found for this or any previous visit (from the past 240  hour(s)).   Radiology Studies: No results found.  Scheduled Meds: . allopurinol  100 mg Oral Daily  . digoxin  0.125 mg Oral Daily  . folic acid  1 mg Oral Daily  . furosemide  60 mg Intravenous BID  . insulin aspart  0-9 Units Subcutaneous TID WC  . insulin aspart  3 Units Subcutaneous TID WC  . multivitamin with minerals  1 tablet Oral Daily  . QUEtiapine  400 mg Oral BID  . rivaroxaban  20 mg Oral Daily  . rosuvastatin  40 mg Oral Daily  . thiamine  100 mg Oral Daily   Or  . thiamine  100 mg Intravenous Daily   Continuous Infusions: . sodium chloride 10 mL/hr at 12/27/17 1844     LOS: 3 days    , MD [facility] Pager On Amion  If 7PM-7AM, please contact night-coverage 12/30/2017,  3:37 PM    

## 2017-12-30 NOTE — Plan of Care (Signed)
  Problem: Health Behavior/Discharge Planning: Goal: Ability to manage health-related needs will improve Outcome: Progressing   Problem: Clinical Measurements: Goal: Ability to maintain clinical measurements within normal limits will improve Outcome: Progressing Goal: Will remain free from infection Outcome: Progressing Goal: Cardiovascular complication will be avoided Outcome: Progressing   Problem: Coping: Goal: Level of anxiety will decrease Outcome: Progressing

## 2017-12-30 NOTE — Progress Notes (Signed)
Supplemental oxygen has been off, sats still WNL.

## 2017-12-30 NOTE — Progress Notes (Signed)
Inpatient Diabetes Program Recommendations  AACE/ADA: New Consensus Statement on Inpatient Glycemic Control (2015)  Target Ranges:  Prepandial:   less than 140 mg/dL      Peak postprandial:   less than 180 mg/dL (1-2 hours)      Critically ill patients:  140 - 180 mg/dL   Lab Results  Component Value Date   GLUCAP 238 (H) 12/30/2017   HGBA1C 8.3 (H) 09/07/2017     Results for [name], [name] (MRN [id number]) as of 12/30/2017 13:01  Ref. Range 12/29/2017 21:58 12/30/2017 07:20 12/30/2017 12:03  Glucose-Capillary Latest Ref Range: 70 - 99 mg/dL 238 (H) 232 (H) 238 (H)    DM2  Home DM meds: Tresiba 0.5 mg weekly                              Novolog 10 units tid ac                              Glucophage 500 mg daily  Current DM meds:  Novolog (0-9 units) tid ac                                  Novolog 3 units meal coverage tid     Novolog meal coverage added yesterday. CBG remains elevated above goal. May want to consider adding basal insulin.    MD please consider the following insulin adjustments:  Add Lantus 20 units daily (.15 units/kg)    Thank you.     -- Will follow during hospitalization.--  Jonna Clark RN, MSN Diabetes Coordinator Inpatient Glycemic Control Team Team Pager: [phone] (8am-5pm)

## 2017-12-30 NOTE — Progress Notes (Signed)
Physical Therapy Treatment Patient Details Name: [name] [name] MRN: [id number] DOB: 09/01/1955 Today's Date: 12/30/2017    History of Present Illness Pt is a 62 y.o. male admitted 12/27/17 with SOB; worked up for CHF exacerbation likely secondary to increased sodium intake and digoxin non-compliance. PMH includes a-fib, CHF, CKD2, alcohol abuse, DM2, HTN, CVA (09/2017), schizophrenia.   PT Comments    Pt progressing well with mobility. Mod indep with ambulation using RW; only requiring assist for IV pole. Supervision for standing as pt with self-corrected posterior LOB. Unable to get SpO2 read on pulse oximeter; ambulated on RA and returned to 1L O2 [place] upon return to room. Pt feels comfortable returning home with continued assist from aide. Will plan for stair training next session if pt remains admitted.    Follow Up Recommendations  No PT follow up;Supervision - Intermittent     Equipment Recommendations  None recommended by PT    Recommendations for Other Services       Precautions / Restrictions Precautions Precautions: Fall Restrictions Weight Bearing Restrictions: No    Mobility  Bed Mobility               General bed mobility comments: Received sitting in recliner  Transfers Overall transfer level: Needs assistance Equipment used: Rolling walker (2 wheeled) Transfers: Sit to/from Stand Sit to Stand: Supervision         General transfer comment: Reliant on momentum and UE support to power into standing; self-corrected posterior LOB upon standing, no physical assist required  Ambulation/Gait Ambulation/Gait assistance: Modified independent (Device/Increase time) Gait Distance (Feet): 200 Feet Assistive device: Rolling walker (2 wheeled) Gait Pattern/deviations: Step-through pattern;Decreased stride length;Decreased dorsiflexion - left;Trunk flexed;Wide base of support Gait velocity: Decreased Gait velocity interpretation: 1.31 - 2.62 ft/sec, indicative  of limited community ambulator General Gait Details: Slow, steady amb mod indep with RW; pt with baseline out-toeing and RW slightly too narrow to keep hips inside width. Better LLE management noted without cues this session. Only requiring assist to manage IV pole. Amb a few steps in room without UE support and supervision   Stairs             Wheelchair Mobility    Modified Rankin (Stroke Patients Only)       Balance Overall balance assessment: Needs assistance   Sitting balance-Leahy Scale: Good       Standing balance-Leahy Scale: Fair Standing balance comment: Can static stand and take steps without UE support                            Cognition Arousal/Alertness: Awake/alert Behavior During Therapy: WFL for tasks assessed/performed Overall Cognitive Status: Within Functional Limits for tasks assessed                                 General Comments: WFL for simple tasks      Exercises      General Comments General comments (skin integrity, edema, etc.): Unable to get SpO2 read on pulse oximeter; pt resting on 1.5 L O2 [place] - ambulated on RA, returned 1L O2 [place] upon return to room      Pertinent Vitals/Pain Pain Assessment: No/denies pain    Home Living                      Prior Function  PT Goals (current goals can now be found in the care plan section) Acute Rehab PT Goals Patient Stated Goal: Return home with continued assist from aide PT Goal Formulation: With patient Time For Goal Achievement: 01/11/18 Potential to Achieve Goals: Good Progress towards PT goals: Progressing toward goals    Frequency    Min 3X/week      PT Plan Current plan remains appropriate    Co-evaluation              AM-PAC PT "6 Clicks" Mobility   Outcome Measure  Help needed turning from your back to your side while in a flat bed without using bedrails?: None Help needed moving from lying on your back to  sitting on the side of a flat bed without using bedrails?: None Help needed moving to and from a bed to a chair (including a wheelchair)?: A Little Help needed standing up from a chair using your arms (e.g., wheelchair or bedside chair)?: None Help needed to walk in hospital room?: None Help needed climbing 3-5 steps with a railing? : A Little 6 Click Score: 22    End of Session Equipment Utilized During Treatment: Gait belt Activity Tolerance: Patient tolerated treatment well Patient left: in chair;with call bell/phone within reach Nurse Communication: Mobility status PT Visit Diagnosis: Other abnormalities of gait and mobility (R26.89)     Time: 2637-8588 PT Time Calculation (min) (ACUTE ONLY): 17 min  Charges:  $Gait Training: 8-22 mins                    Mabeline Caras, PT, DPT Acute Rehabilitation Services  Pager [phone] Office [phone]  Derry Lory 12/30/2017, 8:58 AM

## 2017-12-30 NOTE — Plan of Care (Signed)
  Problem: Clinical Measurements: Goal: Ability to maintain clinical measurements within normal limits will improve Outcome: Progressing Goal: Respiratory complications will improve Outcome: Progressing   Problem: Activity: Goal: Risk for activity intolerance will decrease Outcome: Progressing   Problem: Coping: Goal: Level of anxiety will decrease Outcome: Progressing   Problem: Elimination: Goal: Will not experience complications related to urinary retention Outcome: Progressing

## 2017-12-30 NOTE — Plan of Care (Signed)
  Problem: Health Behavior/Discharge Planning: Goal: Ability to manage health-related needs will improve Outcome: Progressing   Problem: Clinical Measurements: Goal: Ability to maintain clinical measurements within normal limits will improve Outcome: Progressing   

## 2017-12-31 LAB — BASIC METABOLIC PANEL
Anion gap: 11 (ref 5–15)
BUN: 13 mg/dL (ref 8–23)
CO2: 28 mmol/L (ref 22–32)
Calcium: 10.6 mg/dL — ABNORMAL HIGH (ref 8.9–10.3)
Chloride: 100 mmol/L (ref 98–111)
Creatinine, Ser: 1.07 mg/dL (ref 0.61–1.24)
GFR calc Af Amer: >60 mL/min (ref >60)
GFR calc non Af Amer: >60 mL/min (ref >60)
Glucose, Bld: 168 mg/dL — ABNORMAL HIGH (ref 70–99)
Potassium: 3.7 mmol/L (ref 3.5–5.1)
Sodium: 139 mmol/L (ref 135–145)

## 2017-12-31 LAB — GLUCOSE, CAPILLARY
Glucose-Capillary: 143 mg/dL — ABNORMAL HIGH (ref 70–99)
Glucose-Capillary: 202 mg/dL — ABNORMAL HIGH (ref 70–99)

## 2017-12-31 MED ORDER — INSULIN ASPART 100 UNIT/ML ~~LOC~~ SOLN
0.0000 [IU] | Freq: Three times a day (TID) | SUBCUTANEOUS | Status: DC
  Administered 2017-12-31: 3 [IU] via SUBCUTANEOUS
  Administered 2017-12-31: 1 [IU] via SUBCUTANEOUS

## 2017-12-31 MED ORDER — INSULIN ASPART 100 UNIT/ML ~~LOC~~ SOLN
3.0000 [IU] | Freq: Three times a day (TID) | SUBCUTANEOUS | Status: DC
  Administered 2017-12-31 (×2): 3 [IU] via SUBCUTANEOUS

## 2017-12-31 NOTE — Progress Notes (Signed)
Patient discharged: Home with family  Via: Wheelchair   Discharge paperwork given: to patient and family by Billie Lade D   Reviewed with teach back  IV and telemetry disconnected  Belongings given to the patient.Pateint forgot his sleepers. Unit secretary contacted sister since she was not able to reach patient and notified Graves Nipp about patient accidentally leaving sleepers . Sister notified that sleepers are at the nursing station. Sister stated that neece will come and pick up sleepers today or tomorrow.

## 2017-12-31 NOTE — Progress Notes (Signed)
SATURATION QUALIFICATIONS: (This note is used to comply with regulatory documentation for home oxygen)  Patient Saturations on Room Air at Rest = 100%  Patient Saturations on Room Air while Ambulating = 100%  

## 2017-12-31 NOTE — Progress Notes (Signed)
Call placed to [facility] to notify of telemetry monitoring d/c.   

## 2017-12-31 NOTE — Care Management Important Message (Signed)
Important Message  Patient Details  Name: [name] [name] MRN: [id number] Date of Birth: [birth year]   Medicare Important Message Given:  Yes    Barb Merino Daniella Dewberry 12/31/2017, 12:55 PM

## 2017-12-31 NOTE — Progress Notes (Signed)
Pt had 7 beat run of vtach. Pt asymptomatic. Provider notified.

## 2017-12-31 NOTE — Discharge Summary (Signed)
Physician Discharge Summary  [name] [name] GUR:[id number] DOB: [birth year] DOA: 12/27/2017  PCP: [name], MD  Admit date: 12/27/2017 Discharge date: 12/31/2017  Admitted From: Home Disposition:  Home  Recommendations for Outpatient Follow-up:  1. Follow up with PCP in 1-2 weeks 2. Recommend follow up with Cardiology as scheduled 3. Please note Delene Loll was held secondary to soft blood pressure. Would defer to primary Cardiologist regarding resuming Entresto as outpatient  Discharge Condition:Improved CODE STATUS:Full Diet recommendation: Diabetic, Heart healthy   Brief/Interim Summary: 62 y.o.malewith medical history significant ofA. fib, chronic combined systolic and diastolic congestive heart failure, CKD 2, prostate cancer, alcoholabuse, type 2 diabetes, hypertension, hyperlipidemia, CVA presenting to the hospital for evaluation of shortness of breath.Patient reports 3-day history of dyspnea on exertion, orthopnea, paroxysmal nocturnal dyspnea, cough, and abdominal tightness. No abdominal pain. He is currently taking torsemide 80 mg daily. Does state that he has been eating more salt this past week due to Thanksgiving. Denies having any fevers, chills, or chest pain.  ED Course:Placed on 15 L oxygen via nonrebreather by EMS. Oxygen saturation 90% on room air in the ED, placed on 2 L supplemental oxygen. BNP pending. No leukocytosis. Troponin 0.13. EKG not suggestive of ACS. Chest x-ray showing new bilateral heterogenous pulmonary opacities which may represent pulmonary edema and small left pleural effusion.He received IV Lasix 60 mg once in the EDand this was continued BID with improvement in oxygenation.  Discharge Diagnoses:  Principal Problem:   Acute exacerbation of CHF (congestive heart failure) (HCC) Active Problems:   HLD (hyperlipidemia)   HTN (hypertension)   Transaminitis   CKD (chronic kidney disease) stage 2, GFR 60-89 ml/min   Alcohol  abuse   Schizophrenia (HCC)   AF (paroxysmal atrial fibrillation) (HCC)   Type 2 diabetes mellitus (HCC)   Chronic combined systolic (congestive) and diastolic (congestive) heart failure (HCC)   Elevated troponin   Acute on chronic respiratory failure with hypoxia (HCC)   Gout   1st degree AV block   Physical deconditioning  Acute hypoxic respiratory failuredue to acute on chronic combined HFrEF: Due to increased salt load over holidays, possibly medication nonadherence as well with undetectable digoxin level. Aug 2019 EF 25-30%. BNP elevated, bilateral opacities on CXR without infectious symptoms, and continues to have crackles on exam. PCT undetectable.  - Tolerated lasix '60mg'$  IV BID with improvement - Continue homecoreg 3.125 mg twice daily - Continue home digoxin 0.125 mg daily - Holding eplerenone, entresto due to need for aggressive diuresis. BP soft at time of d/c thus will defer to primary Cardiologist regarding resuming Entresto -Resume home diuretic on discharge  Troponin elevation: Mild, downward trend not consistent with ACS. Suspect demand ischemia in absence of chest pain.  - No chest pain this AM  Uncontrolled type 2 diabetes: HbA1c 8.3%.  - Continue SSI, hold metformin -Have added meal coverage of 3units novolog while in hospital -Resume home diabetic meds on discharge  Stage II CKD: No AKI at this time.  - Cr remained stable  Paroxysmal atrial fibrillation: Currently NSR.  - Continue xarelto forCHA2DS2-VAScof 6.  - Continue low dose coreg.  - Monitoring telemetry with acute heart failure.  Gout: Chronic, no flare. - Continue allopurinol as tolerated -No evidence of flare this admit  Transaminitis: -Patient has a history of hepatic steatosis secondary to alcohol abuse.  LFTs mildly elevated (AST 56, ALT 69),previously elevated as well. Alk phos and T bili levels normal. - Recommend outpatient follow-up with PCP  History of alcohol  abuse:  Denies recent abuse, no evidence of withdrawal at this time.  - Continued on thiamine, folate, multivitamin  Schizophrenia: No acute psychosis. -Continue home quetiapine as tolerated  Hypertension: Previously on coreg.  - RN reports hypotension -Vitals reviewed. Sbp, around 100. Coreg currently on hold  Hyperlipidemia - Continue homecrestor as tolerated  1st degree AVB: Chronic TSH wnl.  - Continue medications as above.  Physical deconditioning - PT ambulated in hall on room air  Discharge Instructions   Allergies as of 12/31/2017      Reactions   Heparin Other (See Comments)   Documented HIT under problem list Pt states he's not allergic. " They told me not take it anymore"   Spironolactone Other (See Comments)   Breast tenderness   Thorazine [chlorpromazine Hcl] Other (See Comments)   Body freezes up      Medication List    TAKE these medications   ACCU-CHEK FASTCLIX LANCETS Misc Check blood sugar 4 times a day before meals and bedtime   ACCU-CHEK GUIDE w/Device Kit 1 each by Does not apply route 4 (four) times daily.   allopurinol 100 MG tablet Commonly known as:  ZYLOPRIM Take 1 tablet (100 mg total) by mouth daily.   B-D UF III MINI PEN NEEDLES 31G X 5 MM Misc Generic drug:  Insulin Pen Needle Use four times daily as directed. Dx code E11.00, Z79.4   carvedilol 3.125 MG tablet Commonly known as:  COREG Take 1 tablet (3.125 mg total) by mouth 2 (two) times daily with a meal.   cyclobenzaprine 10 MG tablet Commonly known as:  FLEXERIL Take 10 mg by mouth 3 (three) times daily as needed for muscle spasms.   digoxin 0.125 MG tablet Commonly known as:  LANOXIN Take 1 tablet (0.125 mg total) by mouth daily.   eplerenone 25 MG tablet Commonly known as:  INSPRA Take 1 tablet (25 mg total) by mouth daily.   glucose blood test strip Check blood sugar 4 times a day before meal and bedtime   insulin regular 100 units/mL injection Commonly known as:   NOVOLIN R,HUMULIN R Inject 0.1 mLs (10 Units total) into the skin 3 (three) times daily before meals. 3 times with meals.Take 30 minutes before meal. What changed:  additional instructions   metFORMIN 500 MG tablet Commonly known as:  GLUCOPHAGE Take 500 mg by mouth daily with breakfast.   QUEtiapine 400 MG tablet Commonly known as:  SEROQUEL Take 1 tablet (400 mg total) by mouth 2 (two) times daily for 21 days. What changed:    how much to take  when to take this   rivaroxaban 20 MG Tabs tablet Commonly known as:  XARELTO Take 1 tablet (20 mg total) by mouth daily.   rosuvastatin 40 MG tablet Commonly known as:  CRESTOR Take 1 tablet (40 mg total) by mouth daily.   sacubitril-valsartan 49-51 MG Commonly known as:  ENTRESTO Take 1 tablet by mouth 2 (two) times daily.   torsemide 20 MG tablet Commonly known as:  DEMADEX Take 4 tablets (80 mg total) by mouth daily.   TRESIBA FLEXTOUCH 200 UNIT/ML Sopn Generic drug:  Insulin Degludec Inject 0.5 mLs into the skin every Wednesday.      Follow-up Information    [name], MD.   Specialty:  Internal Medicine Why:  Office will call patient Contact information: [address] [place] [address] [phone]        Bensimhon, Shaune Pascal, MD. Schedule an appointment  as soon as possible for a visit.   Specialty:  Cardiology Contact information: [place] [address] [phone]          Allergies  Allergen Reactions  . Heparin Other (See Comments)    Documented HIT under problem list Pt states he's not allergic. " They told me not take it anymore"  . Spironolactone Other (See Comments)    Breast tenderness  . Thorazine [Chlorpromazine Hcl] Other (See Comments)    Body freezes up    Procedures/Studies: Dg Chest Port 1 View  Result Date: 12/27/2017 CLINICAL DATA:  Shortness of breath.  History of CHF. EXAM: PORTABLE CHEST 1 VIEW COMPARISON:   Chest radiograph 10/22/2017. FINDINGS: Monitoring leads overlie the patient. Stable cardiomegaly. Interval development of bilateral heterogeneous pulmonary opacities within the mid and lower lungs. Small left pleural effusion. IMPRESSION: New bilateral heterogeneous pulmonary opacities may represent pulmonary edema in the appropriate clinical setting. Small left pleural effusion. Superimposed infection not excluded. Electronically Signed   By: Lovey Newcomer M.D.   On: 12/27/2017 17:49     Subjective: Eager to go home  Discharge Exam: Vitals:   12/31/17 0825 12/31/17 1156  BP: 118/84 119/82  Pulse: 65 98  Resp: 18 20  Temp:  98 F (36.7 C)  SpO2: 98% 98%   Vitals:   12/30/17 1935 12/31/17 0344 12/31/17 0825 12/31/17 1156  BP:  111/80 118/84 119/82  Pulse: 97 86 65 98  Resp:  '18 18 20  '$ Temp:  97.6 F (36.4 C)  98 F (36.7 C)  TempSrc:  Oral  Oral  SpO2:  94% 98% 98%  Weight:  125.8 kg    Height:        General: Pt is alert, awake, not in acute distress Cardiovascular: RRR, S1/S2 +, no rubs, no gallops Respiratory: CTA bilaterally, no wheezing, no rhonchi Abdominal: Soft, NT, ND, bowel sounds + Extremities: no edema, no cyanosis   The results of significant diagnostics from this hospitalization (including imaging, microbiology, ancillary and laboratory) are listed below for reference.     Microbiology: No results found for this or any previous visit (from the past 240 hour(s)).   Labs: BNP (last 3 results) Recent Labs    09/21/17 1214 10/22/17 1403 12/27/17 1726  BNP 99.1 139.9* 270.6*   Basic Metabolic Panel: Recent Labs  Lab 12/27/17 1726 12/28/17 0440 12/29/17 0346 12/30/17 0450 12/31/17 0843  NA 138 139 137 136 139  K 4.3 4.0 3.9 5.7* 3.7  CL 104 103 97* 97* 100  CO2 '25 25 28 29 28  '$ GLUCOSE 158* 153* 193* 183* 168*  BUN '13 15 17 16 13  '$ CREATININE 1.33* 1.11 1.29* 1.09 1.07  CALCIUM 10.3 10.1 10.2 10.1 10.6*   Liver Function Tests: Recent Labs   Lab 12/27/17 1726  AST 56*  ALT 69*  ALKPHOS 110  BILITOT 0.8  PROT 7.4  ALBUMIN 3.8   No results for input(s): LIPASE, AMYLASE in the last 168 hours. No results for input(s): AMMONIA in the last 168 hours. CBC: Recent Labs  Lab 12/27/17 1726  WBC 6.7  NEUTROABS 4.6  HGB 13.0  HCT 42.9  MCV 92.9  PLT 172   Cardiac Enzymes: Recent Labs  Lab 12/27/17 2159 12/28/17 0440 12/28/17 0829  TROPONINI 0.08* 0.08* 0.09*   BNP: Invalid input(s): POCBNP CBG: Recent Labs  Lab 12/30/17 1203 12/30/17 1637 12/30/17 2106 12/31/17 0805 12/31/17 1151  GLUCAP 238* 205* 191* 143* 202*   D-Dimer  No results for input(s): DDIMER in the last 72 hours. Hgb A1c No results for input(s): HGBA1C in the last 72 hours. Lipid Profile No results for input(s): CHOL, HDL, LDLCALC, TRIG, CHOLHDL, LDLDIRECT in the last 72 hours. Thyroid function studies No results for input(s): TSH, T4TOTAL, T3FREE, THYROIDAB in the last 72 hours.  Invalid input(s): FREET3 Anemia work up No results for input(s): VITAMINB12, FOLATE, FERRITIN, TIBC, IRON, RETICCTPCT in the last 72 hours. Urinalysis    Component Value Date/Time   COLORURINE YELLOW 10/22/2017 1403   APPEARANCEUR CLEAR 10/22/2017 1403   LABSPEC 1.009 10/22/2017 1403   PHURINE 5.0 10/22/2017 1403   GLUCOSEU NEGATIVE 10/22/2017 1403   HGBUR NEGATIVE 10/22/2017 1403   BILIRUBINUR NEGATIVE 10/22/2017 1403   KETONESUR NEGATIVE 10/22/2017 1403   PROTEINUR 30 (A) 10/22/2017 1403   UROBILINOGEN 1.0 05/17/2014 1925   NITRITE NEGATIVE 10/22/2017 1403   LEUKOCYTESUR NEGATIVE 10/22/2017 1403   Sepsis Labs Invalid input(s): PROCALCITONIN,  WBC,  LACTICIDVEN Microbiology No results found for this or any previous visit (from the past 240 hour(s)).  Time spent: 30 min  SIGNED:   Marylu Lund, MD  [facility] 12/31/2017, 1:46 PM  If 7PM-7AM, please contact night-coverage

## 2018-02-03 ENCOUNTER — Other Ambulatory Visit: Payer: Self-pay

## 2018-02-03 ENCOUNTER — Emergency Department (HOSPITAL_COMMUNITY): Payer: Medicare Other

## 2018-02-03 ENCOUNTER — Encounter (HOSPITAL_COMMUNITY): Payer: Self-pay

## 2018-02-03 ENCOUNTER — Emergency Department (HOSPITAL_COMMUNITY)
Admission: EM | Admit: 2018-02-03 | Discharge: 2018-02-03 | Disposition: A | Discharge status: Home / Self Care | Payer: Medicare Other | Attending: Emergency Medicine | Admitting: Emergency Medicine

## 2018-02-03 DIAGNOSIS — Z794 Long term (current) use of insulin: Secondary | ICD-10-CM | POA: Diagnosis not present

## 2018-02-03 DIAGNOSIS — Z87891 Personal history of nicotine dependence: Secondary | ICD-10-CM | POA: Insufficient documentation

## 2018-02-03 DIAGNOSIS — N183 Chronic kidney disease, stage 3 (moderate): Secondary | ICD-10-CM | POA: Insufficient documentation

## 2018-02-03 DIAGNOSIS — Z8673 Personal history of transient ischemic attack (TIA), and cerebral infarction without residual deficits: Secondary | ICD-10-CM | POA: Insufficient documentation

## 2018-02-03 DIAGNOSIS — Z8546 Personal history of malignant neoplasm of prostate: Secondary | ICD-10-CM | POA: Diagnosis not present

## 2018-02-03 DIAGNOSIS — R0602 Shortness of breath: Secondary | ICD-10-CM | POA: Diagnosis not present

## 2018-02-03 DIAGNOSIS — I5043 Acute on chronic combined systolic (congestive) and diastolic (congestive) heart failure: Secondary | ICD-10-CM | POA: Diagnosis not present

## 2018-02-03 DIAGNOSIS — E1122 Type 2 diabetes mellitus with diabetic chronic kidney disease: Secondary | ICD-10-CM | POA: Diagnosis not present

## 2018-02-03 DIAGNOSIS — I130 Hypertensive heart and chronic kidney disease with heart failure and stage 1 through stage 4 chronic kidney disease, or unspecified chronic kidney disease: Secondary | ICD-10-CM | POA: Diagnosis not present

## 2018-02-03 DIAGNOSIS — Z79899 Other long term (current) drug therapy: Secondary | ICD-10-CM | POA: Diagnosis not present

## 2018-02-03 DIAGNOSIS — Z7901 Long term (current) use of anticoagulants: Secondary | ICD-10-CM | POA: Insufficient documentation

## 2018-02-03 DIAGNOSIS — R600 Localized edema: Secondary | ICD-10-CM | POA: Diagnosis not present

## 2018-02-03 LAB — CBC WITH DIFFERENTIAL/PLATELET
Abs Immature Granulocytes: 0.03 10*3/uL (ref 0.00–0.07)
BASOS PCT: 1 %
Basophils Absolute: 0 10*3/uL (ref 0.0–0.1)
EOS ABS: 0.1 10*3/uL (ref 0.0–0.5)
EOS PCT: 2 %
HCT: 42.5 % (ref 39.0–52.0)
Hemoglobin: 12.5 g/dL — ABNORMAL LOW (ref 13.0–17.0)
Immature Granulocytes: 0 %
Lymphocytes Relative: 21 %
Lymphs Abs: 1.6 10*3/uL (ref 0.7–4.0)
MCH: 28.2 pg (ref 26.0–34.0)
MCHC: 29.4 g/dL — ABNORMAL LOW (ref 30.0–36.0)
MCV: 95.9 fL (ref 80.0–100.0)
Monocytes Absolute: 0.7 10*3/uL (ref 0.1–1.0)
Monocytes Relative: 9 %
Neutro Abs: 5.2 10*3/uL (ref 1.7–7.7)
Neutrophils Relative %: 67 %
PLATELETS: 222 10*3/uL (ref 150–400)
RBC: 4.43 MIL/uL (ref 4.22–5.81)
RDW: 14.9 % (ref 11.5–15.5)
WBC: 7.7 10*3/uL (ref 4.0–10.5)
nRBC: 0 % (ref 0.0–0.2)

## 2018-02-03 LAB — COMPREHENSIVE METABOLIC PANEL
ALT: 55 U/L — ABNORMAL HIGH (ref 0–44)
AST: 46 U/L — ABNORMAL HIGH (ref 15–41)
Albumin: 4 g/dL (ref 3.5–5.0)
Alkaline Phosphatase: 102 U/L (ref 38–126)
Anion gap: 8 (ref 5–15)
BUN: 24 mg/dL — ABNORMAL HIGH (ref 8–23)
CO2: 30 mmol/L (ref 22–32)
Calcium: 10.3 mg/dL (ref 8.9–10.3)
Chloride: 102 mmol/L (ref 98–111)
Creatinine, Ser: 1.55 mg/dL — ABNORMAL HIGH (ref 0.61–1.24)
GFR calc Af Amer: 54 mL/min — ABNORMAL LOW (ref >60)
GFR calc non Af Amer: 47 mL/min — ABNORMAL LOW (ref >60)
Glucose, Bld: 152 mg/dL — ABNORMAL HIGH (ref 70–99)
Potassium: 3.4 mmol/L — ABNORMAL LOW (ref 3.5–5.1)
Sodium: 140 mmol/L (ref 135–145)
TOTAL PROTEIN: 8 g/dL (ref 6.5–8.1)
Total Bilirubin: 0.7 mg/dL (ref 0.3–1.2)

## 2018-02-03 LAB — I-STAT TROPONIN, ED
Troponin i, poc: 0.17 ng/mL (ref 0.00–0.08)
Troponin i, poc: 0.13 ng/mL (ref 0.00–0.08)

## 2018-02-03 LAB — ETHANOL: Alcohol, Ethyl (B): <10 mg/dL (ref <10)

## 2018-02-03 LAB — BRAIN NATRIURETIC PEPTIDE: B Natriuretic Peptide: 153.2 pg/mL — ABNORMAL HIGH (ref 0.0–100.0)

## 2018-02-03 MED ORDER — FUROSEMIDE 10 MG/ML IJ SOLN
80.0000 mg | Freq: Once | INTRAMUSCULAR | Status: AC
  Administered 2018-02-03: 80 mg via INTRAVENOUS
  Filled 2018-02-03: qty 8

## 2018-02-03 NOTE — Discharge Instructions (Addendum)
Make an appointment to follow-up with your doctors.  Continue your current medications.  Make sure you take your fluid pill at home.  Return for any new or worse symptoms.  Heart marker here remained stable with some improvement.  No evidence of any acute cardiac event.

## 2018-02-03 NOTE — ED Notes (Signed)
ED Provider at bedside. 

## 2018-02-03 NOTE — ED Provider Notes (Signed)
[facility]-EMERGENCY DEPT Provider Note   CSN: [id number] Arrival date & time: 02/03/18  0748     History   Chief Complaint Chief Complaint  Patient presents with  . Shortness of Breath    HPI [name] [name] is a 63 y.o. male.  Patient brought in by EMS from home.  EMS reported flu symptoms for 4 days.  Patient reported exertional dyspnea 4 days with productive cough.  With mucus production.  Patient denies fever chest pain or body aches.  Patient actually stated that I feel like him full of fluid again.  Patient has a history of congestive heart failure.  Upon arrival heart rate was 103 he was afebrile.  Initial blood pressure was recorded at 98/55 however subsequent blood pressures actually had an elevation in his blood pressures.  May have been from not taking his morning meds.  Past medical history is significant for schizophrenia and schizoaffective disorder.  Hyperlipidemia hypertension with a known history of noncompliance.  Past history of cocaine abuse.  Had a stroke in 2014.  He has chronic combined systolic and diastolic congestive heart failure.  Known to have stage II chronic kidney disease.  History of atrial fibrillation.  And diabetes without complication type II.  Patient takes Demadex 80 mg daily.  Patient is on metformin and regular insulin.  Patient taking Xarelto.  Patient on Lanoxin, Coreg.  Patient last seen December 2 for shortness of breath he was admitted at that time.  It seemed to be an exacerbation of his CHF.  Patient was also admitted in August for acute congestive heart failure.  Patient still drinks alcohol.  Patient quit smoking in 2003.    Patient denied any chest pain.  Patient does not use oxygen at home.  Oxygen sats on room air were in the low 90s.  But patient felt more comfortable on 2 L of oxygen so he is currently on that.     Past Medical History:  Diagnosis Date  . Atrial fibrillation (HCC)   . Cancer (HCC)    Prostate  cancer-bx. 3 weeks ago  . Cataract   . Chronic combined systolic and diastolic CHF (congestive heart failure) (HCC)    a) EF 40-45% per 2D echo (02/2012) with grade 1 DD b)  NICM c) RHC (04/2012): RA: 4, RV 45/3/4, PA 42/9 (24), PCWP 14, Fick CO/CI: 5.2 /2.2, PVR 1.9 WU, PA 62% and 64% d) ECHO (10/2012) EF 40-45%, grade II DD, RV nl  . CKD (chronic kidney disease) stage 2, GFR 60-89 ml/min    BL SCr approximately 1-1.3  . Colitis 05/2009   History of colitis of ascending colon noted on CT abd/pelvis (05/2009), with interval resolution with subsequent CT  . Continuous chronic alcoholism (HCC)   . Degenerative lumbar spinal stenosis    s/p L2-3, L3-4, L4-5 laminectomy partial facetectomy, and bilateral foraminotomy  . Diabetes mellitus without complication (HCC)    Type II  . Dysrhythmia    A. Fib  . Family history of adverse reaction to anesthesia    "sister, can't go to sleep"  . GERD (gastroesophageal reflux disease)   . H/O cocaine abuse (HCC)   . Hepatic steatosis    suspected 2/2 alcohol abuse  . Hepatitis C   . History of pancreatitis 01/2011   Admission for acute pancreatitis presumed 2/2 ongoing alcohol abuse- and hypertriglyceridemia  . History of pneumonia   . HIT (heparin-induced thrombocytopenia) (HCC)   . Hyperlipidemia   . Hypertension      uncontrolled with medication noncompliance  . Hypertriglyceridemia   . NICM (nonischemic cardiomyopathy) ([place])    a. LHC (04/2012): nl arteries  . PFO (patent foramen ovale) 01/2012   with right to left shunt, noted per TEE in evaluation for source of embolic stroke in 06/7122  . Rhabdomyolysis 02/22/2012   H/O rhabdomyolysis in 01/2012 that was idiopathic, cause never identified  . Schizophrenia, schizo-affective ([place])   . Shortness of breath dyspnea   . Splenic cyst   . Stroke ([place]) 01/2012   Small cerebellar infarcts right greater than left as well as questionable acute left external capsule and caudate nuclear punctate lacunar  infarcts noted per MRI (01/2012) - presumed to be embolic likely source PFO with right to left shunt (noted per TEE 01/ 2014)    Patient Active Problem List   Diagnosis Date Noted  . Acute on chronic respiratory failure with hypoxia ([place]) 12/28/2017  . Gout 12/28/2017  . 1st degree AV block 12/28/2017  . Physical deconditioning 12/28/2017  . Acute exacerbation of CHF (congestive heart failure) ([place]) 12/27/2017  . Schizophrenia, schizo-affective ([place])   . Syncope, vasovagal 10/22/2017  . Syncope 10/22/2017  . Acute on chronic systolic heart failure ([place]) 09/07/2017  . Stasis dermatitis of both legs 09/07/2017  . Hypotension 05/31/2017  . DKA (diabetic ketoacidoses) ([place]) 05/27/2017  . Acute metabolic encephalopathy 58/09/9831  . CKD (chronic kidney disease), stage III ([place]) 05/27/2017  . Stroke ([place]) 05/27/2017  . Acute encephalopathy   . Chronic combined systolic (congestive) and diastolic (congestive) heart failure ([place]) 05/12/2017  . Elevated troponin 05/12/2017  . Hyperosmolar non-ketotic state in patient with type 2 diabetes mellitus ([place]) 05/12/2017  . Dehydration 05/12/2017  . Nonketotic hyperglycinemia, type II ([place]) 05/12/2017  . AKI (acute kidney injury) ([place])   . Chest pain 11/27/2016  . SOB (shortness of breath) 11/26/2016  . Type 2 diabetes mellitus ([place])   . Overgrown toenails 09/10/2015  . Heart failure with reduced ejection fraction, NYHA class III ([place]) 06/07/2015  . Hepatitis C antibody test positive 06/07/2015  . AF (paroxysmal atrial fibrillation) ([place]) 06/07/2015  . Schizophrenia ([place]) 03/06/2014  . Alcohol abuse 09/20/2013  . Generalized weakness 08/04/2013  . Cocaine abuse ([place]) 08/01/2013  . Carpal tunnel syndrome 04/11/2013  . Chronic back pain greater than 3 months duration 07/19/2012  . Gastric ulcer with hemorrhage 06/25/2012  . CKD (chronic kidney disease) stage 2, GFR 60-89 ml/min   . CVA (cerebral infarction) 02/29/2012  . Transaminitis  02/21/2012  . HTN (hypertension) 02/20/2012  . HLD (hyperlipidemia) 05/06/2007    Past Surgical History:  Procedure Laterality Date  . ACHILLES TENDON REPAIR Right 2007   "it was torn"  . BACK SURGERY    . CARDIAC CATHETERIZATION N/A 09/02/2015   Procedure: Right Heart Cath;  Surgeon: Jolaine Artist, MD;  Location: [facility];  Service: Cardiovascular;  Laterality: N/A;  . CATARACT EXTRACTION W/ INTRAOCULAR LENS IMPLANT Right   . ESOPHAGOGASTRODUODENOSCOPY N/A 06/25/2012   Procedure: ESOPHAGOGASTRODUODENOSCOPY (EGD);  Surgeon: Milus Banister, MD;  Location: [facility];  Service: Endoscopy;  Laterality: N/A;  . I&D EXTREMITY  03/20/2011   Procedure: IRRIGATION AND DEBRIDEMENT EXTREMITY;  Surgeon: Kerin Salen, MD;  Location: [facility];  Service: Orthopedics;  Laterality: Left;  I&D LEFT ACHILLIES TENDON  . KNEE ARTHROSCOPY Left   . LUMBAR LAMINECTOMY     L2-3, L3-4, L4-5 laminectomy, partial facetectomy  . RESECTION DISTAL CLAVICAL  09/17/2011   Procedure: RESECTION DISTAL CLAVICAL;  Surgeon:  Justin William Chandler, MD;  Location: [facility];  Service: Orthopedics;  Laterality: Right;  right shoulder arthroscopy with sad and open distal clavicle excision   . ROBOT ASSISTED LAPAROSCOPIC RADICAL PROSTATECTOMY N/A 12/16/2012   Procedure: ROBOTIC ASSISTED LAPAROSCOPIC PROSTATECTOMY ;  Surgeon: Benjamin W Herrick, MD;  Location: [facility];  Service: Urology;  Laterality: N/A;  . TONSILLECTOMY          Home Medications    Prior to Admission medications   Medication Sig Start Date End Date Taking? Authorizing Provider  allopurinol (ZYLOPRIM) 100 MG tablet Take 1 tablet (100 mg total) by mouth daily. 09/14/17  Yes Regalado, Belkys A, MD  carvedilol (COREG) 3.125 MG tablet Take 1 tablet (3.125 mg total) by mouth 2 (two) times daily with a meal. 06/03/17  Yes Tillery, Michael Andrew, PA-C  cyclobenzaprine (FLEXERIL) 10 MG tablet Take 10 mg by mouth 3 (three) times daily.   10/26/17  Yes [provider]  digoxin (LANOXIN) 0.125 MG tablet Take 1 tablet (0.125 mg total) by mouth daily. 09/14/17  Yes Regalado, Belkys A, MD  Insulin Degludec (TRESIBA FLEXTOUCH) 200 UNIT/ML SOPN Inject 100 Units into the skin every Wednesday.    Yes [provider]  insulin glargine (LANTUS) 100 UNIT/ML injection Inject 50 Units into the skin at bedtime.   Yes [provider]  insulin lispro (HUMALOG) 100 UNIT/ML injection Inject 15 Units into the skin 3 (three) times daily before meals.   Yes [provider]  metFORMIN (GLUCOPHAGE) 500 MG tablet Take 500 mg by mouth daily with breakfast.    Yes [provider]  QUEtiapine (SEROQUEL) 400 MG tablet Take 1 tablet (400 mg total) by mouth 2 (two) times daily for 21 days. Patient taking differently: Take 800 mg by mouth at bedtime.  06/03/17 02/03/18 Yes Hobbs, Phillip M, MD  rivaroxaban (XARELTO) 20 MG TABS tablet Take 1 tablet (20 mg total) by mouth daily. 06/04/17  Yes Tillery, Michael Andrew, PA-C  rosuvastatin (CRESTOR) 40 MG tablet Take 1 tablet (40 mg total) by mouth daily. 07/07/17  Yes Smith, Ashley M, NP  torsemide (DEMADEX) 20 MG tablet Take 4 tablets (80 mg total) by mouth daily. 09/13/17  Yes Regalado, Belkys A, MD  ACCU-CHEK FASTCLIX LANCETS MISC Check blood sugar 4 times a day before meals and bedtime 02/16/17   Klima, Lawrence, MD  B-D UF III MINI PEN NEEDLES 31G X 5 MM MISC Use four times daily as directed. Dx code E11.00, Z79.4 12/23/16   Narendra, Nischal, MD  Blood Glucose Monitoring Suppl (ACCU-CHEK GUIDE) w/Device KIT 1 each by Does not apply route 4 (four) times daily. 02/16/17   Klima, Lawrence, MD  eplerenone (INSPRA) 25 MG tablet Take 1 tablet (25 mg total) by mouth daily. Patient not taking: Reported on 02/03/2018 06/23/17   Bensimhon, Daniel R, MD  glucose blood (ACCU-CHEK GUIDE) test strip Check blood sugar 4 times a day before meal and bedtime 02/16/17   Klima, Lawrence, MD  insulin  regular (NOVOLIN R,HUMULIN R) 100 units/mL injection Inject 0.1 mLs (10 Units total) into the skin 3 (three) times daily before meals. 3 times with meals.Take 30 minutes before meal. Patient not taking: Reported on 02/03/2018 09/13/17   Regalado, Belkys A, MD    Family History Family History  Problem Relation Age of Onset  . CAD Mother 40       deceased  . CAD Sister   . CAD Brother 39       died   from MI at age 46yo  . Hypertension Other     Social History Social History   Tobacco Use  . Smoking status: Former Smoker    Packs/day: 1.00    Years: 30.00    Pack years: 30.00    Types: Cigarettes    Last attempt to quit: 06/24/2001    Years since quitting: 16.6  . Smokeless tobacco: Never Used  Substance Use Topics  . Alcohol use: Yes    Alcohol/week: 6.0 standard drinks    Types: 6 Glasses of wine per week    Comment: occasinally  . Drug use: Not Currently    Types: "Crack" cocaine, Cocaine     Allergies   Heparin; Spironolactone; and Thorazine [chlorpromazine hcl]   Review of Systems Review of Systems  Constitutional: Negative for chills and fever.  HENT: Negative for congestion, rhinorrhea and sore throat.   Eyes: Negative for redness and visual disturbance.  Respiratory: Positive for cough and shortness of breath.   Cardiovascular: Positive for leg swelling. Negative for chest pain.  Gastrointestinal: Negative for abdominal pain, diarrhea, nausea and vomiting.  Genitourinary: Negative for dysuria.  Musculoskeletal: Negative for back pain and neck pain.  Skin: Negative for rash.  Neurological: Negative for dizziness, light-headedness and headaches.  Hematological: Bruises/bleeds easily.  Psychiatric/Behavioral: Negative for confusion.     Physical Exam Updated Vital Signs BP (!) 143/115   Pulse 99   Temp 97.7 F (36.5 C)   Resp (!) 28   Wt 131.5 kg   SpO2 91%   BMI 40.45 kg/m   Physical Exam Vitals signs and nursing note reviewed.  Constitutional:        General: He is not in acute distress.    Appearance: He is well-developed.  HENT:     Head: Normocephalic and atraumatic.     Mouth/Throat:     Mouth: Mucous membranes are moist.  Eyes:     Conjunctiva/sclera: Conjunctivae normal.  Neck:     Musculoskeletal: Neck supple.  Cardiovascular:     Rate and Rhythm: Normal rate and regular rhythm.     Heart sounds: No murmur.  Pulmonary:     Effort: Pulmonary effort is normal. No respiratory distress.     Breath sounds: Rales present. No wheezing.     Comments: Tachypneic Chest:     Chest wall: No tenderness.  Abdominal:     Palpations: Abdomen is soft.     Tenderness: There is no abdominal tenderness.  Musculoskeletal:        General: Swelling present.     Right lower leg: Edema present.     Left lower leg: Edema present.  Skin:    General: Skin is warm and dry.  Neurological:     General: No focal deficit present.     Mental Status: He is alert and oriented to person, place, and time.      ED Treatments / Results  Labs (all labs ordered are listed, but only abnormal results are displayed) Labs Reviewed  BRAIN NATRIURETIC PEPTIDE - Abnormal; Notable for the following components:      Result Value   B Natriuretic Peptide 153.2 (*)    All other components within normal limits  COMPREHENSIVE METABOLIC PANEL - Abnormal; Notable for the following components:   Potassium 3.4 (*)    Glucose, Bld 152 (*)    BUN 24 (*)    Creatinine, Ser 1.55 (*)    AST 46 (*)    ALT 55 (*)  GFR calc non Af Amer 47 (*)    GFR calc Af Amer 54 (*)    All other components within normal limits  CBC WITH DIFFERENTIAL/PLATELET - Abnormal; Notable for the following components:   Hemoglobin 12.5 (*)    MCHC 29.4 (*)    All other components within normal limits  I-STAT TROPONIN, ED - Abnormal; Notable for the following components:   Troponin i, poc 0.17 (*)    All other components within normal limits  I-STAT TROPONIN, ED - Abnormal; Notable  for the following components:   Troponin i, poc 0.13 (*)    All other components within normal limits  ETHANOL    EKG EKG Interpretation  Date/Time:  Thursday February 03 2018 08:24:41 EST Ventricular Rate:  97 PR Interval:    QRS Duration: 109 QT Interval:  375 QTC Calculation: 477 R Axis:   -111 Text Interpretation:  Sinus or ectopic atrial rhythm Ventricular premature complex LAD, consider left anterior fascicular block Consider anterior infarct Nonspecific T abnormalities, lateral leads First degree A-V block No significant change since last tracing Reconfirmed by Fredia Sorrow [phone]) on 02/03/2018 12:21:56 PM   Radiology Dg Chest 2 View  Result Date: 02/03/2018 CLINICAL DATA:  Flu-like symptoms.  Shortness of breath. EXAM: CHEST - 2 VIEW COMPARISON:  Chest x-ray 12/27/2017. FINDINGS: Cardiomegaly with diffuse bilateral from interstitial prominence consistent with CHF. Pneumonitis can not be excluded. Improved aeration of both lungs noted from 12/27/2017. No pleural effusion or pneumothorax. IMPRESSION: Cardiomegaly with diffuse bilateral from interstitial prominence suggesting CHF. Pneumonitis can not be excluded. Improved aeration of both lungs noted from 12/27/2017. Electronically Signed   By: Marcello Moores  Register   On: 02/03/2018 10:44   Ct Chest Wo Contrast  Result Date: 02/03/2018 CLINICAL DATA:  Flu like symptoms for 4 days. Exertional dyspnea. Productive cough. EXAM: CT CHEST WITHOUT CONTRAST TECHNIQUE: Multidetector CT imaging of the chest was performed following the standard protocol without IV contrast. COMPARISON:  Chest radiograph 02/03/2018 FINDINGS: Cardiovascular: Enlarged cardiac silhouette. Small pericardial effusion. Normal contour of the great vessels. Minimal calcific atherosclerotic disease of the coronary arteries. Mediastinum/Nodes: Mild right-sided mediastinal lymphadenopathy with the largest paratracheal lymph node measuring 13 mm in short axis. Normal appearance of  the thyroid gland, esophagus and trachea. Lungs/Pleura: No evidence of lobar consolidation, pleural effusion or pneumothorax. Minimal interstitial pulmonary edema. Linear atelectatic changes in the right middle lobe and lingula. Upper Abdomen: Numerous cystic lesions and rim calcified lesions throughout the spleen with benign appearance. Musculoskeletal: No chest wall mass or suspicious bone lesions identified. 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 IMPRESSION: No evidence of lobar consolidation. Minimal interstitial pulmonary edema. Cardiomegaly with small pericardial effusion. Mild right-sided mediastinal lymphadenopathy, etiology uncertain. Follow-up in 6-12 months may be considered. Electronically Signed   By: Fidela Salisbury M.D.   On: 02/03/2018 11:52    Procedures Procedures (including critical care time)  Medications Ordered in ED Medications  furosemide (LASIX) injection 80 mg (80 mg Intravenous Given 02/03/18 1231)     Initial Impression / Assessment and Plan / ED Course  I have reviewed the triage vital signs and the nursing notes.  Pertinent labs & imaging results that were available during my care of the patient were reviewed by me and considered in my medical decision making (see chart for details).    Patient's work-up was focused on his respiratory symptoms.  Chest x-ray done to rule out pneumonia versus CHF.  It was equivocal so CT chest  was done.  Which was more suggestive of congestive heart failure pulmonary edema.  Patient received 80 mg of Lasix had a good diuresis.  Felt much better.  Clinically do not feel patient symptoms are consistent with the flu but an exacerbation of his CHF.  Patient wanted to go home.  Patient was ambulated off oxygen and maintain sats fine did not run into any distress from a respiratory standpoint with exertion.  Patient's blood pressure gradually climbed throughout his stay probably from not having his  morning meds.  CT of chest was done after patient had received Lasix.  Still think clinically this was a mild exacerbation of his CHF.  Certainly CT showed no evidence of pneumonia.  Patient reasonably stable to go home.  Patient will return for any new or worse symptoms.  No black-and-white indication for admission.  Patient told that his blood pressure has been elevated and to follow-up with that and take his meds when he got home.  Patient does have his diuretic at home and has his other medications to take.  The other concern during his visit was that his troponin was slightly elevated.  Repeat in 3 hours actually went down but it still was abnormal.  This is probably secondary to his significant congestive heart failure.  He has had elevated troponins in the past.  Patient had no chest pain with this.  Feel that this is just a baseline ischemia due to the CHF.   Final Clinical Impressions(s) / ED Diagnoses   Final diagnoses:  SOB (shortness of breath)  Acute on chronic combined systolic and diastolic congestive heart failure [facility])    ED Discharge Orders    None       Fredia Sorrow, MD 02/04/18 [phone]

## 2018-02-03 NOTE — ED Notes (Signed)
Bed: WA21 Expected date:  Expected time:  Means of arrival:  Comments: EMS flu like symptoms

## 2018-02-03 NOTE — ED Notes (Signed)
Pt ambulated down the hall off of oxygen and SpO2 stayed at 97%.

## 2018-02-03 NOTE — ED Triage Notes (Addendum)
Pt arrives via EMS from home. Pt reports flu like symptoms for 4 days. Pt reports exertional dyspnea for the 4 days with productive cough with thick green mucus.  Pt denies fevers or chest pain. Pt states " I feel like I am full of fluid again" Pt has hx of CHF.

## 2018-02-08 ENCOUNTER — Encounter (HOSPITAL_COMMUNITY): Payer: Self-pay | Admitting: Emergency Medicine

## 2018-02-08 ENCOUNTER — Inpatient Hospital Stay (HOSPITAL_COMMUNITY)
Admission: EM | Admit: 2018-02-08 | Discharge: 2018-02-11 | DRG: 291 | Disposition: A | Discharge status: Home Health | Payer: Medicare Other | Attending: Internal Medicine | Admitting: Internal Medicine

## 2018-02-08 ENCOUNTER — Other Ambulatory Visit: Payer: Self-pay

## 2018-02-08 ENCOUNTER — Emergency Department (HOSPITAL_COMMUNITY): Payer: Medicare Other

## 2018-02-08 DIAGNOSIS — N179 Acute kidney failure, unspecified: Secondary | ICD-10-CM | POA: Diagnosis present

## 2018-02-08 DIAGNOSIS — E119 Type 2 diabetes mellitus without complications: Secondary | ICD-10-CM

## 2018-02-08 DIAGNOSIS — Z87891 Personal history of nicotine dependence: Secondary | ICD-10-CM

## 2018-02-08 DIAGNOSIS — Z7901 Long term (current) use of anticoagulants: Secondary | ICD-10-CM

## 2018-02-08 DIAGNOSIS — R778 Other specified abnormalities of plasma proteins: Secondary | ICD-10-CM | POA: Diagnosis present

## 2018-02-08 DIAGNOSIS — I130 Hypertensive heart and chronic kidney disease with heart failure and stage 1 through stage 4 chronic kidney disease, or unspecified chronic kidney disease: Principal | ICD-10-CM | POA: Diagnosis present

## 2018-02-08 DIAGNOSIS — E781 Pure hyperglyceridemia: Secondary | ICD-10-CM | POA: Diagnosis present

## 2018-02-08 DIAGNOSIS — R0902 Hypoxemia: Secondary | ICD-10-CM

## 2018-02-08 DIAGNOSIS — F209 Schizophrenia, unspecified: Secondary | ICD-10-CM | POA: Diagnosis present

## 2018-02-08 DIAGNOSIS — R7989 Other specified abnormal findings of blood chemistry: Secondary | ICD-10-CM | POA: Diagnosis not present

## 2018-02-08 DIAGNOSIS — F1011 Alcohol abuse, in remission: Secondary | ICD-10-CM | POA: Diagnosis present

## 2018-02-08 DIAGNOSIS — I5043 Acute on chronic combined systolic (congestive) and diastolic (congestive) heart failure: Secondary | ICD-10-CM

## 2018-02-08 DIAGNOSIS — Z888 Allergy status to other drugs, medicaments and biological substances status: Secondary | ICD-10-CM

## 2018-02-08 DIAGNOSIS — K219 Gastro-esophageal reflux disease without esophagitis: Secondary | ICD-10-CM | POA: Diagnosis present

## 2018-02-08 DIAGNOSIS — I5023 Acute on chronic systolic (congestive) heart failure: Secondary | ICD-10-CM | POA: Diagnosis not present

## 2018-02-08 DIAGNOSIS — I428 Other cardiomyopathies: Secondary | ICD-10-CM | POA: Diagnosis present

## 2018-02-08 DIAGNOSIS — I480 Paroxysmal atrial fibrillation: Secondary | ICD-10-CM | POA: Diagnosis not present

## 2018-02-08 DIAGNOSIS — N182 Chronic kidney disease, stage 2 (mild): Secondary | ICD-10-CM

## 2018-02-08 DIAGNOSIS — F1411 Cocaine abuse, in remission: Secondary | ICD-10-CM | POA: Diagnosis present

## 2018-02-08 DIAGNOSIS — Z794 Long term (current) use of insulin: Secondary | ICD-10-CM

## 2018-02-08 DIAGNOSIS — I509 Heart failure, unspecified: Secondary | ICD-10-CM

## 2018-02-08 DIAGNOSIS — N183 Chronic kidney disease, stage 3 (moderate): Secondary | ICD-10-CM | POA: Diagnosis present

## 2018-02-08 DIAGNOSIS — E1169 Type 2 diabetes mellitus with other specified complication: Secondary | ICD-10-CM

## 2018-02-08 DIAGNOSIS — Z79899 Other long term (current) drug therapy: Secondary | ICD-10-CM

## 2018-02-08 DIAGNOSIS — I959 Hypotension, unspecified: Secondary | ICD-10-CM | POA: Diagnosis present

## 2018-02-08 DIAGNOSIS — E785 Hyperlipidemia, unspecified: Secondary | ICD-10-CM | POA: Diagnosis present

## 2018-02-08 DIAGNOSIS — E1122 Type 2 diabetes mellitus with diabetic chronic kidney disease: Secondary | ICD-10-CM | POA: Diagnosis present

## 2018-02-08 DIAGNOSIS — Z8673 Personal history of transient ischemic attack (TIA), and cerebral infarction without residual deficits: Secondary | ICD-10-CM

## 2018-02-08 LAB — BASIC METABOLIC PANEL
Anion gap: 10 (ref 5–15)
BUN: 21 mg/dL (ref 8–23)
CALCIUM: 9.7 mg/dL (ref 8.9–10.3)
CO2: 23 mmol/L (ref 22–32)
Chloride: 101 mmol/L (ref 98–111)
Creatinine, Ser: 1.41 mg/dL — ABNORMAL HIGH (ref 0.61–1.24)
GFR calc Af Amer: >60 mL/min (ref >60)
GFR calc non Af Amer: 53 mL/min — ABNORMAL LOW (ref >60)
Glucose, Bld: 152 mg/dL — ABNORMAL HIGH (ref 70–99)
Potassium: 3.6 mmol/L (ref 3.5–5.1)
Sodium: 134 mmol/L — ABNORMAL LOW (ref 135–145)

## 2018-02-08 LAB — GLUCOSE, CAPILLARY
GLUCOSE-CAPILLARY: 152 mg/dL — AB (ref 70–99)
Glucose-Capillary: 146 mg/dL — ABNORMAL HIGH (ref 70–99)
Glucose-Capillary: 143 mg/dL — ABNORMAL HIGH (ref 70–99)
Glucose-Capillary: 264 mg/dL — ABNORMAL HIGH (ref 70–99)

## 2018-02-08 LAB — TROPONIN I
TROPONIN I: 0.12 ng/mL — AB (ref <0.03)
Troponin I: 0.11 ng/mL (ref <0.03)
Troponin I: 0.13 ng/mL (ref <0.03)
Troponin I: 0.1 ng/mL (ref <0.03)

## 2018-02-08 LAB — I-STAT TROPONIN, ED: Troponin i, poc: 0.14 ng/mL (ref 0.00–0.08)

## 2018-02-08 LAB — CBC
HCT: 39.7 % (ref 39.0–52.0)
Hemoglobin: 12.2 g/dL — ABNORMAL LOW (ref 13.0–17.0)
MCH: 28.6 pg (ref 26.0–34.0)
MCHC: 30.7 g/dL (ref 30.0–36.0)
MCV: 93 fL (ref 80.0–100.0)
Platelets: 230 10*3/uL (ref 150–400)
RBC: 4.27 MIL/uL (ref 4.22–5.81)
RDW: 14.7 % (ref 11.5–15.5)
WBC: 7 10*3/uL (ref 4.0–10.5)
nRBC: 0 % (ref 0.0–0.2)

## 2018-02-08 LAB — BRAIN NATRIURETIC PEPTIDE: B Natriuretic Peptide: 243.1 pg/mL — ABNORMAL HIGH (ref 0.0–100.0)

## 2018-02-08 MED ORDER — CARVEDILOL 3.125 MG PO TABS
3.1250 mg | ORAL_TABLET | Freq: Two times a day (BID) | ORAL | Status: DC
  Administered 2018-02-08 – 2018-02-11 (×5): 3.125 mg via ORAL
  Filled 2018-02-08 (×7): qty 1

## 2018-02-08 MED ORDER — DIGOXIN 125 MCG PO TABS
0.1250 mg | ORAL_TABLET | Freq: Every day | ORAL | Status: DC
  Administered 2018-02-08 – 2018-02-11 (×4): 0.125 mg via ORAL
  Filled 2018-02-08 (×4): qty 1

## 2018-02-08 MED ORDER — ROSUVASTATIN CALCIUM 20 MG PO TABS
40.0000 mg | ORAL_TABLET | Freq: Every day | ORAL | Status: DC
  Administered 2018-02-08 – 2018-02-10 (×3): 40 mg via ORAL
  Filled 2018-02-08 (×3): qty 2

## 2018-02-08 MED ORDER — FUROSEMIDE 10 MG/ML IJ SOLN
60.0000 mg | Freq: Two times a day (BID) | INTRAMUSCULAR | Status: DC
  Administered 2018-02-08 – 2018-02-09 (×4): 60 mg via INTRAVENOUS
  Filled 2018-02-08 (×5): qty 6

## 2018-02-08 MED ORDER — ONDANSETRON HCL 4 MG/2ML IJ SOLN: 4.0000 mg | Freq: Four times a day (QID) | INTRAMUSCULAR | Status: DC | PRN

## 2018-02-08 MED ORDER — INSULIN GLARGINE 100 UNIT/ML ~~LOC~~ SOLN
40.0000 [IU] | Freq: Every day | SUBCUTANEOUS | Status: DC
  Administered 2018-02-08: 40 [IU] via SUBCUTANEOUS
  Filled 2018-02-08: qty 0.4

## 2018-02-08 MED ORDER — SODIUM CHLORIDE 0.9 % IV SOLN: 250.0000 mL | INTRAVENOUS | Status: DC | PRN

## 2018-02-08 MED ORDER — QUETIAPINE FUMARATE 400 MG PO TABS
800.0000 mg | ORAL_TABLET | Freq: Every day | ORAL | Status: DC
  Administered 2018-02-08 – 2018-02-10 (×3): 800 mg via ORAL
  Filled 2018-02-08 (×2): qty 2
  Filled 2018-02-08: qty 8
  Filled 2018-02-08: qty 2

## 2018-02-08 MED ORDER — SODIUM CHLORIDE 0.9% FLUSH
3.0000 mL | Freq: Two times a day (BID) | INTRAVENOUS | Status: DC
  Administered 2018-02-08 – 2018-02-10 (×7): 3 mL via INTRAVENOUS

## 2018-02-08 MED ORDER — INSULIN ASPART 100 UNIT/ML ~~LOC~~ SOLN
0.0000 [IU] | Freq: Every day | SUBCUTANEOUS | Status: DC
  Administered 2018-02-08: 3 [IU] via SUBCUTANEOUS

## 2018-02-08 MED ORDER — CYCLOBENZAPRINE HCL 10 MG PO TABS
10.0000 mg | ORAL_TABLET | Freq: Three times a day (TID) | ORAL | Status: DC
  Administered 2018-02-08 – 2018-02-11 (×10): 10 mg via ORAL
  Filled 2018-02-08 (×11): qty 1

## 2018-02-08 MED ORDER — SODIUM CHLORIDE 0.9% FLUSH: 3.0000 mL | INTRAVENOUS | Status: DC | PRN

## 2018-02-08 MED ORDER — FUROSEMIDE 10 MG/ML IJ SOLN
80.0000 mg | Freq: Once | INTRAMUSCULAR | Status: AC
  Administered 2018-02-08: 80 mg via INTRAVENOUS
  Filled 2018-02-08: qty 8

## 2018-02-08 MED ORDER — GUAIFENESIN-DM 100-10 MG/5ML PO SYRP
5.0000 mL | ORAL_SOLUTION | ORAL | Status: DC | PRN
  Administered 2018-02-08 – 2018-02-10 (×3): 5 mL via ORAL
  Filled 2018-02-08 (×2): qty 5

## 2018-02-08 MED ORDER — INSULIN ASPART 100 UNIT/ML ~~LOC~~ SOLN
0.0000 [IU] | Freq: Three times a day (TID) | SUBCUTANEOUS | Status: DC
  Administered 2018-02-08 (×2): 2 [IU] via SUBCUTANEOUS
  Administered 2018-02-08 – 2018-02-10 (×4): 3 [IU] via SUBCUTANEOUS
  Administered 2018-02-10: 2 [IU] via SUBCUTANEOUS
  Administered 2018-02-10: 3 [IU] via SUBCUTANEOUS
  Administered 2018-02-11: 2 [IU] via SUBCUTANEOUS

## 2018-02-08 MED ORDER — ACETAMINOPHEN 325 MG PO TABS
650.0000 mg | ORAL_TABLET | ORAL | Status: DC | PRN
  Administered 2018-02-09 – 2018-02-11 (×2): 650 mg via ORAL
  Filled 2018-02-08 (×2): qty 2

## 2018-02-08 MED ORDER — RIVAROXABAN 20 MG PO TABS
20.0000 mg | ORAL_TABLET | Freq: Every day | ORAL | Status: DC
  Administered 2018-02-08 – 2018-02-11 (×4): 20 mg via ORAL
  Filled 2018-02-08 (×4): qty 1

## 2018-02-08 NOTE — Progress Notes (Signed)
Ambulated down the hall about 40 feet, stand by assistance w/ 3L of O2. Tolerated well, O2 down to 91 while ambulating.

## 2018-02-08 NOTE — H&P (Signed)
History and Physical    MARIE CHOW ITG:[id number] DOB: 02-21-1955 DOA: 02/08/2018  PCP: [name], MD   Patient coming from: Home   Chief Complaint: SOB, orthopnea   HPI: [name] [name] is a 63 y.o. male with medical history significant for nonischemic cardiomyopathy with EF 25 to 30% in August 2019, insulin-dependent diabetes mellitus, history of alcohol and cocaine abuse in remission, and paroxysmal atrial fibrillation on Xarelto, now presenting to the emergency department for evaluation of shortness of breath.  Patient reports the insidious development of exertional dyspnea, bilateral lower extremity swelling, and orthopnea over the past week or more.  He was seen in the emergency department on 02/03/2018, treated with IV Lasix, reports some improvement with this, but soon began to worsen again.  He reports dyspnea while at rest for the past 2 days.  He called EMS, was noted to be saturating in the upper 80s to low 90s on room air, and was brought into the ED.  ED Course: Upon arrival to the ED, patient is found to be afebrile, saturating low 90s on 2 L/min of supplemental oxygen, tachypneic in the 30s, slightly tachycardic, and with stable blood pressure.  EKG features sinus tachycardia with rate 100, PACs, and nonspecific IVCD.  Chest x-ray features stable cardiomegaly with reticular opacities likely reflecting interstitial edema.  Chemistry panel is notable for a creatinine 1.41, improved from recent prior.  CBC is unremarkable.  Troponin is elevated to 0.14.  Patient was given 80 mg IV Lasix in the ED.  He remains hemodynamically stable, is not in acute distress, but remains dyspneic at rest with new oxygen requirement and will be observed for further evaluation and management.  Review of Systems:  All other systems reviewed and apart from HPI, are negative.  Past Medical History:  Diagnosis Date  . Atrial fibrillation ([place])   . Cancer [facility])    Prostate cancer-bx. 3  weeks ago  . Cataract   . Chronic combined systolic and diastolic CHF (congestive heart failure) (HCC)    a) EF 40-45% per 2D echo (02/2012) with grade 1 DD b)  NICM c) RHC (04/2012): RA: 4, RV 45/3/4, PA 42/9 (24), PCWP 14, Fick CO/CI: 5.2 /2.2, PVR 1.9 WU, PA 62% and 64% d) ECHO (10/2012) EF 40-45%, grade II DD, RV nl  . CKD (chronic kidney disease) stage 2, GFR 60-89 ml/min    BL SCr approximately 1-1.3  . Colitis 05/2009   History of colitis of ascending colon noted on CT abd/pelvis (05/2009), with interval resolution with subsequent CT  . Continuous chronic alcoholism ([place])   . Degenerative lumbar spinal stenosis    s/p L2-3, L3-4, L4-5 laminectomy partial facetectomy, and bilateral foraminotomy  . Diabetes mellitus without complication ([place])    Type II  . Dysrhythmia    A. Fib  . Family history of adverse reaction to anesthesia    "sister, can't go to sleep"  . GERD (gastroesophageal reflux disease)   . H/O cocaine abuse ([place])   . Hepatic steatosis    suspected 2/2 alcohol abuse  . Hepatitis C   . History of pancreatitis 01/2011   Admission for acute pancreatitis presumed 2/2 ongoing alcohol abuse- and hypertriglyceridemia  . History of pneumonia   . HIT (heparin-induced thrombocytopenia) ([place])   . Hyperlipidemia   . Hypertension    uncontrolled with medication noncompliance  . Hypertriglyceridemia   . NICM (nonischemic cardiomyopathy) ([place])    a. LHC (04/2012): nl arteries  . PFO (patent foramen  ovale) 01/2012   with right to left shunt, noted per TEE in evaluation for source of embolic stroke in 05/4625  . Rhabdomyolysis 02/22/2012   H/O rhabdomyolysis in 01/2012 that was idiopathic, cause never identified  . Schizophrenia, schizo-affective ([place])   . Shortness of breath dyspnea   . Splenic cyst   . Stroke ([place]) 01/2012   Small cerebellar infarcts right greater than left as well as questionable acute left external capsule and caudate nuclear punctate lacunar infarcts noted  per MRI (01/2012) - presumed to be embolic likely source PFO with right to left shunt (noted per TEE 01/ 2014)    Past Surgical History:  Procedure Laterality Date  . ACHILLES TENDON REPAIR Right 2007   "it was torn"  . BACK SURGERY    . CARDIAC CATHETERIZATION N/A 09/02/2015   Procedure: Right Heart Cath;  Surgeon: Jolaine Artist, MD;  Location: [facility];  Service: Cardiovascular;  Laterality: N/A;  . CATARACT EXTRACTION W/ INTRAOCULAR LENS IMPLANT Right   . ESOPHAGOGASTRODUODENOSCOPY N/A 06/25/2012   Procedure: ESOPHAGOGASTRODUODENOSCOPY (EGD);  Surgeon: Milus Banister, MD;  Location: [place];  Service: Endoscopy;  Laterality: N/A;  . I&D EXTREMITY  03/20/2011   Procedure: IRRIGATION AND DEBRIDEMENT EXTREMITY;  Surgeon: Kerin Salen, MD;  Location: [place];  Service: Orthopedics;  Laterality: Left;  I&D LEFT ACHILLIES TENDON  . KNEE ARTHROSCOPY Left   . LUMBAR LAMINECTOMY     L2-3, L3-4, L4-5 laminectomy, partial facetectomy  . RESECTION DISTAL CLAVICAL  09/17/2011   Procedure: RESECTION DISTAL CLAVICAL;  Surgeon: Nita Sells, MD;  Location: [place];  Service: Orthopedics;  Laterality: Right;  right shoulder arthroscopy with sad and open distal clavicle excision   . ROBOT ASSISTED LAPAROSCOPIC RADICAL PROSTATECTOMY N/A 12/16/2012   Procedure: ROBOTIC ASSISTED LAPAROSCOPIC PROSTATECTOMY ;  Surgeon: Ardis Hughs, MD;  Location: [facility];  Service: Urology;  Laterality: N/A;  . TONSILLECTOMY       reports that he quit smoking about 16 years ago. His smoking use included cigarettes. He has a 30.00 pack-year smoking history. He has never used smokeless tobacco. He reports current alcohol use of about 6.0 standard drinks of alcohol per week. He reports previous drug use. Drugs: "Crack" cocaine and Cocaine.  Allergies  Allergen Reactions  . Heparin Other (See Comments)    Documented HIT under problem list Pt states he's not allergic. " They  told me not take it anymore"  . Spironolactone Other (See Comments)    Breast tenderness  . Thorazine [Chlorpromazine Hcl] Other (See Comments)    Body freezes up    Family History  Problem Relation Age of Onset  . CAD Mother 45       deceased  . CAD Sister   . CAD Brother 59       died from MI at age 36yo  . Hypertension Other      Prior to Admission medications   Medication Sig Start Date End Date Taking? Authorizing Provider  ACCU-CHEK FASTCLIX LANCETS MISC Check blood sugar 4 times a day before meals and bedtime 02/16/17   [name], MD  allopurinol (ZYLOPRIM) 100 MG tablet Take 1 tablet (100 mg total) by mouth daily. 09/14/17   Regalado, Belkys A, MD  B-D UF III MINI PEN NEEDLES 31G X 5 MM MISC Use four times daily as directed. Dx code E11.00, Z79.4 12/23/16   Aldine Contes, MD  Blood Glucose Monitoring Suppl (ACCU-CHEK GUIDE) w/Device KIT 1 each  by Does not apply route 4 (four) times daily. 02/16/17   [name], MD  carvedilol (COREG) 3.125 MG tablet Take 1 tablet (3.125 mg total) by mouth 2 (two) times daily with a meal. 06/03/17   Tillery, Satira Mccallum, PA-C  cyclobenzaprine (FLEXERIL) 10 MG tablet Take 10 mg by mouth 3 (three) times daily.  10/26/17   [provider]  digoxin (LANOXIN) 0.125 MG tablet Take 1 tablet (0.125 mg total) by mouth daily. 09/14/17   Regalado, Belkys A, MD  eplerenone (INSPRA) 25 MG tablet Take 1 tablet (25 mg total) by mouth daily. Patient not taking: Reported on 2018/02/23 06/23/17   Bensimhon, Shaune Pascal, MD  glucose blood (ACCU-CHEK GUIDE) test strip Check blood sugar 4 times a day before meal and bedtime 02/16/17   [name], MD  Insulin Degludec (TRESIBA FLEXTOUCH) 200 UNIT/ML SOPN Inject 100 Units into the skin every Wednesday.     [provider]  insulin glargine (LANTUS) 100 UNIT/ML injection Inject 50 Units into the skin at bedtime.    [provider]  insulin lispro (HUMALOG) 100 UNIT/ML injection  Inject 15 Units into the skin 3 (three) times daily before meals.    [provider]  metFORMIN (GLUCOPHAGE) 500 MG tablet Take 500 mg by mouth daily with breakfast.     [provider]  QUEtiapine (SEROQUEL) 400 MG tablet Take 1 tablet (400 mg total) by mouth 2 (two) times daily for 21 days. Patient taking differently: Take 800 mg by mouth at bedtime.  06/03/17 Feb 23, 2018  Elwin Mocha, MD  rivaroxaban (XARELTO) 20 MG TABS tablet Take 1 tablet (20 mg total) by mouth daily. 06/04/17   Shirley Friar, PA-C  rosuvastatin (CRESTOR) 40 MG tablet Take 1 tablet (40 mg total) by mouth daily. 07/07/17   Georgiana Shore, NP  torsemide (DEMADEX) 20 MG tablet Take 4 tablets (80 mg total) by mouth daily. 09/13/17   Elmarie Shiley, MD    Physical Exam: Vitals:   02/08/18 0023 02/08/18 0030 02/08/18 0045 02/08/18 0130  BP: 107/84 (!) 124/93 (!) 135/110 113/88  Pulse: (!) 107 (!) 104 (!) 103 (!) 101  Resp: (!) 33  (!) 24 (!) 26  Temp: 98.2 F (36.8 C)     TempSrc: Oral     SpO2: 93% 97% 99% 99%    Constitutional: NAD, calm  Eyes: PERTLA, lids and conjunctivae normal ENMT: Mucous membranes are moist. Posterior pharynx clear of any exudate or lesions.   Neck: normal, supple, no masses, no thyromegaly Respiratory: Rales. Dyspneic with speech, no wheezing, no rhonchi. Normal respiratory effort.  Cardiovascular: S1 & S2 heard, regular rate and rhythm. 2+ pretibial pitting edema bialaterally. Abdomen: No distension, no tenderness, soft. Bowel sounds normal.  Musculoskeletal: no clubbing / cyanosis. No joint deformity upper and lower extremities.  Skin: no significant rashes, lesions, ulcers. Warm, dry, well-perfused. Neurologic: No facial asymmetry. Sensation intact. Moving all extremities.  Psychiatric: Alert and oriented to person, place, and situation. Calm, cooperative.    Labs on Admission: I have personally reviewed following labs and imaging studies  CBC: Recent Labs    Lab 02-23-18 0859 02/08/18 0029  WBC 7.7 7.0  NEUTROABS 5.2  --   HGB 12.5* 12.2*  HCT 42.5 39.7  MCV 95.9 93.0  PLT 222 370   Basic Metabolic Panel: Recent Labs  Lab 2018-02-23 0859 02/08/18 0029  NA 140 134*  K 3.4* 3.6  CL 102 101  CO2 30 23  GLUCOSE 152*  152*  BUN 24* 21  CREATININE 1.55* 1.41*  CALCIUM 10.3 9.7   GFR: Estimated Creatinine Clearance: 74.2 mL/min (A) (by C-G formula based on SCr of 1.41 mg/dL (H)). Liver Function Tests: Recent Labs  Lab 02/03/18 0859  AST 46*  ALT 55*  ALKPHOS 102  BILITOT 0.7  PROT 8.0  ALBUMIN 4.0   No results for input(s): LIPASE, AMYLASE in the last 168 hours. No results for input(s): AMMONIA in the last 168 hours. Coagulation Profile: No results for input(s): INR, PROTIME in the last 168 hours. Cardiac Enzymes: No results for input(s): CKTOTAL, CKMB, CKMBINDEX, TROPONINI in the last 168 hours. BNP (last 3 results) No results for input(s): PROBNP in the last 8760 hours. HbA1C: No results for input(s): HGBA1C in the last 72 hours. CBG: No results for input(s): GLUCAP in the last 168 hours. Lipid Profile: No results for input(s): CHOL, HDL, LDLCALC, TRIG, CHOLHDL, LDLDIRECT in the last 72 hours. Thyroid Function Tests: No results for input(s): TSH, T4TOTAL, FREET4, T3FREE, THYROIDAB in the last 72 hours. Anemia Panel: No results for input(s): VITAMINB12, FOLATE, FERRITIN, TIBC, IRON, RETICCTPCT in the last 72 hours. Urine analysis:    Component Value Date/Time   COLORURINE YELLOW 10/22/2017 [place] 10/22/2017 1403   LABSPEC 1.009 10/22/2017 1403   PHURINE 5.0 10/22/2017 1403   GLUCOSEU NEGATIVE 10/22/2017 1403   HGBUR NEGATIVE 10/22/2017 1403   BILIRUBINUR NEGATIVE 10/22/2017 1403   KETONESUR NEGATIVE 10/22/2017 1403   PROTEINUR 30 (A) 10/22/2017 1403   UROBILINOGEN 1.0 05/17/2014 1925   NITRITE NEGATIVE 10/22/2017 1403   LEUKOCYTESUR NEGATIVE 10/22/2017 1403   Sepsis  Labs: _0 (procalcitonin:4,lacticidven:4) )No results found for this or any previous visit (from the past 240 hour(s)).   Radiological Exams on Admission: Dg Chest 2 View  Result Date: 02/08/2018 CLINICAL DATA:  63 y/o M; chest pain, shortness of breath, history of congestive heart failure. EXAM: CHEST - 2 VIEW COMPARISON:  02/03/2018 chest radiograph and CT chest. FINDINGS: Stable cardiomegaly. Aortic atherosclerosis with calcification. Stable diffuse reticular opacities of the lungs. No focal consolidation. No pleural effusion or pneumothorax. No acute osseous abnormality is evident. IMPRESSION: Stable cardiomegaly. Reticular opacities similar to prior studies, likely interstitial edema. Electronically Signed   By: Kristine Garbe M.D.   On: 02/08/2018 01:00    EKG: Independently reviewed. Sinus tachycardia (rate 100), PAC's, non-specific IVCD.   Assessment/Plan  1. Acute on chronic systolic CHF  - Presents with progressive DOE and orthopnea, had improved several days ago after IV Lasix in ED but has since been worsening again and now dyspneic at rest  - Treated with Lasix 80 mg IV in ED  - Continue diuresis with Lasix 60 mg IV q12h, continue Coreg, follow daily wt and I/O's, continue supplemental O2 as-needed    2. Elevated troponin  - Troponin is elevated to 0.14 in ED without chest pain  - Appears to have chronic elevation  - Continue cardiac monitoring, trend troponin, continue ASA, continue statin, and continue beta-blocker    3. CKD stage II  - SCr is 1.41 on admission, improved from most recent prior  - Renally-dose medications, follow daily chem panel during IV diuresis   4. Insulin-dependent DM  - A1c was 8.3% in August  - Managed at home with Lantus 50 units qHS, Humalog 15 units TID, and metformin  - Check CBG's, continue Lantus, and use Novolog while in hospital    5. Paroxysmal atrial fibrillation  - In sinus rhythm on  admission  - CHADS-VASc is 4 (CVA  x2, CHF, DM) - Continue Xarelto, digoxin    6. Schizophrenia  - Stable, continue Seroquel    DVT prophylaxis: Lovenox  Code Status: Full  Family Communication: Discussed with patient  Consults called: None Admission status: Observation     Vianne Bulls, MD [facility] Pager [phone]  If 7PM-7AM, please contact night-coverage [email or web address] Password [email or web address]  02/08/2018, 1:47 AM

## 2018-02-08 NOTE — Progress Notes (Signed)
  PROGRESS NOTE  COLVIN BLATT BTD:[id number] DOB: 06-Nov-1955 DOA: 02/08/2018 PCP: [name], MD  Brief History   63 year old man multiple medical problems presented with shortness of breath, admitted for acute on chronic systolic CHF.  A & P  Acute on chronic systolic CHF.  LVEF 25-30% August 2019. --Still significant volume overload, shortness of breath.  Continue IV diuresis.  Check BMP in a.m.  Elevated troponin, chronic --Asymptomatic, no chest pain, no further evaluation suggested.  Diabetes mellitus type 2 on insulin, hemoglobin A1c 8.3% in August 2019. --CBG stable.  Continue Lantus, NovoLog.  Hold metformin while hospitalized.  Paroxysmal atrial fibrillation on Xarelto. CHA2DS2-VASc 4 --Continue Xarelto, digoxin, carvedilol  PMH alcohol, cocaine abuse, in remission  Schizophrenia --Stable.  Continue Seroquel     DVT prophylaxis: Xarelto. History of HIT Code Status: Full Family Communication: none Disposition Plan: home    Murray Hodgkins, MD  [facility] Direct contact: see [email or web address]  7PM-7AM contact night coverage as above 02/08/2018, 2:36 PM  LOS: 0 days   Consultants  .   Procedures  .   Antibiotics  .   Interval History/Subjective  Feels better but still SOB, not back to baseline  Objective   Vitals:  Vitals:   02/08/18 1100 02/08/18 1300  BP:    Pulse: 95 95  Resp: (!) 26 (!) 23  Temp:    SpO2: 96% 96%    Exam:  Constitutional:  . Appears calm and comfortable Eyes:  . pupils and irises appear normal ENMT:  . grossly normal hearing  Respiratory:  . CTA bilaterally, no w/r/r.  . Respiratory effort mildly increased Cardiovascular:  . RRR, no m/r/g . 2+ bilateral LE extremity edema   Skin:  . Very dry skin bilateral lower extremities, feet. Psychiatric:  . Mental status o Mood, affect appropriate  I have personally reviewed the following:   Today's Data  . CBG stable . Creatinine stable  1.41 . Troponin stable 0.13, noted to be elevated December, September, August, May, April 2019.  Lab Data  .   Micro Data  .   Imaging  . Chest x-ray interstitial edema  Cardiology Data  . EKG sinus tachycardia, no acute changes  Other Data  .   Scheduled Meds: . carvedilol  3.125 mg Oral BID WC  . cyclobenzaprine  10 mg Oral TID  . digoxin  0.125 mg Oral Daily  . furosemide  60 mg Intravenous Q12H  . insulin aspart  0-15 Units Subcutaneous TID WC  . insulin aspart  0-5 Units Subcutaneous QHS  . insulin glargine  40 Units Subcutaneous QHS  . QUEtiapine  800 mg Oral QHS  . rivaroxaban  20 mg Oral Daily  . rosuvastatin  40 mg Oral q1800  . sodium chloride flush  3 mL Intravenous Q12H   Continuous Infusions: . sodium chloride      Principal Problem:   Acute on chronic systolic CHF (congestive heart failure) (HCC) Active Problems:   Schizophrenia (HCC)   AF (paroxysmal atrial fibrillation) (HCC)   Type 2 diabetes mellitus (HCC)   Elevated troponin   LOS: 0 days    Time approximately 12:30 PM-1:05 PM, review of chart, discussion with patient, examination of patient, formulation and update to plan of care

## 2018-02-08 NOTE — ED Notes (Signed)
Patient currently at X-ray .  

## 2018-02-08 NOTE — ED Provider Notes (Signed)
TIME SEEN: 12:55 AM  CHIEF COMPLAINT: Shortness of breath, lower extremity swelling  HPI: Patient is a 63 year old male with history of combined systolic and diastolic heart failure, atrial fibrillation on Xarelto, hypertension, hyperlipidemia, diabetes, schizophrenia, substance abuse who presents to the emergency department with complaints of shortness of breath over the past 2 to 3 days, dry cough, increasing lower extremity swelling, intermittent chest pain.  No chest pain currently.  No fever.  Was seen in the emergency department on 02/03/2018 for similar symptoms and diagnosed with mild CHF exacerbation.  Reports compliance with his Demadex.  No known history of PE or DVT.  Always has positive troponin.  Does not wear oxygen at home.  Sats in the upper 80s to low 90s here in the ED at rest.  ROS: See HPI Constitutional: no fever  Eyes: no drainage  ENT: no runny nose   Cardiovascular: Intermittent chest pain  Resp:  SOB  GI: no vomiting GU: no dysuria Integumentary: no rash  Allergy: no hives  Musculoskeletal: Bilateral leg swelling  Neurological: no slurred speech ROS otherwise negative  PAST MEDICAL HISTORY/PAST SURGICAL HISTORY:  Past Medical History:  Diagnosis Date  . Atrial fibrillation ([place])   . Cancer [facility])    Prostate cancer-bx. 3 weeks ago  . Cataract   . Chronic combined systolic and diastolic CHF (congestive heart failure) (HCC)    a) EF 40-45% per 2D echo (02/2012) with grade 1 DD b)  NICM c) RHC (04/2012): RA: 4, RV 45/3/4, PA 42/9 (24), PCWP 14, Fick CO/CI: 5.2 /2.2, PVR 1.9 WU, PA 62% and 64% d) ECHO (10/2012) EF 40-45%, grade II DD, RV nl  . CKD (chronic kidney disease) stage 2, GFR 60-89 ml/min    BL SCr approximately 1-1.3  . Colitis 05/2009   History of colitis of ascending colon noted on CT abd/pelvis (05/2009), with interval resolution with subsequent CT  . Continuous chronic alcoholism ([place])   . Degenerative lumbar spinal stenosis    s/p L2-3, L3-4, L4-5  laminectomy partial facetectomy, and bilateral foraminotomy  . Diabetes mellitus without complication ([place])    Type II  . Dysrhythmia    A. Fib  . Family history of adverse reaction to anesthesia    "sister, can't go to sleep"  . GERD (gastroesophageal reflux disease)   . H/O cocaine abuse ([place])   . Hepatic steatosis    suspected 2/2 alcohol abuse  . Hepatitis C   . History of pancreatitis 01/2011   Admission for acute pancreatitis presumed 2/2 ongoing alcohol abuse- and hypertriglyceridemia  . History of pneumonia   . HIT (heparin-induced thrombocytopenia) ([place])   . Hyperlipidemia   . Hypertension    uncontrolled with medication noncompliance  . Hypertriglyceridemia   . NICM (nonischemic cardiomyopathy) ([place])    a. LHC (04/2012): nl arteries  . PFO (patent foramen ovale) 01/2012   with right to left shunt, noted per TEE in evaluation for source of embolic stroke in 07/1694  . Rhabdomyolysis 02/22/2012   H/O rhabdomyolysis in 01/2012 that was idiopathic, cause never identified  . Schizophrenia, schizo-affective ([place])   . Shortness of breath dyspnea   . Splenic cyst   . Stroke ([place]) 01/2012   Small cerebellar infarcts right greater than left as well as questionable acute left external capsule and caudate nuclear punctate lacunar infarcts noted per MRI (01/2012) - presumed to be embolic likely source PFO with right to left shunt (noted per TEE 01/ 2014)    MEDICATIONS:  Prior to  Admission medications   Medication Sig Start Date End Date Taking? Authorizing Provider  ACCU-CHEK FASTCLIX LANCETS MISC Check blood sugar 4 times a day before meals and bedtime 02/16/17   [name], MD  allopurinol (ZYLOPRIM) 100 MG tablet Take 1 tablet (100 mg total) by mouth daily. 09/14/17   Regalado, Belkys A, MD  B-D UF III MINI PEN NEEDLES 31G X 5 MM MISC Use four times daily as directed. Dx code E11.00, Z79.4 12/23/16   Aldine Contes, MD  Blood Glucose Monitoring Suppl (ACCU-CHEK GUIDE)  w/Device KIT 1 each by Does not apply route 4 (four) times daily. 02/16/17   [name], MD  carvedilol (COREG) 3.125 MG tablet Take 1 tablet (3.125 mg total) by mouth 2 (two) times daily with a meal. 06/03/17   Tillery, Satira Mccallum, PA-C  cyclobenzaprine (FLEXERIL) 10 MG tablet Take 10 mg by mouth 3 (three) times daily.  10/26/17   [provider]  digoxin (LANOXIN) 0.125 MG tablet Take 1 tablet (0.125 mg total) by mouth daily. 09/14/17   Regalado, Belkys A, MD  eplerenone (INSPRA) 25 MG tablet Take 1 tablet (25 mg total) by mouth daily. Patient not taking: Reported on 02/03/2018 06/23/17   Bensimhon, Shaune Pascal, MD  glucose blood (ACCU-CHEK GUIDE) test strip Check blood sugar 4 times a day before meal and bedtime 02/16/17   [name], MD  Insulin Degludec (TRESIBA FLEXTOUCH) 200 UNIT/ML SOPN Inject 100 Units into the skin every Wednesday.     [provider]  insulin glargine (LANTUS) 100 UNIT/ML injection Inject 50 Units into the skin at bedtime.    [provider]  insulin lispro (HUMALOG) 100 UNIT/ML injection Inject 15 Units into the skin 3 (three) times daily before meals.    [provider]  insulin regular (NOVOLIN R,HUMULIN R) 100 units/mL injection Inject 0.1 mLs (10 Units total) into the skin 3 (three) times daily before meals. 3 times with meals.Take 30 minutes before meal. Patient not taking: Reported on 02/03/2018 09/13/17   Regalado, Jerald Kief A, MD  metFORMIN (GLUCOPHAGE) 500 MG tablet Take 500 mg by mouth daily with breakfast.     [provider]  QUEtiapine (SEROQUEL) 400 MG tablet Take 1 tablet (400 mg total) by mouth 2 (two) times daily for 21 days. Patient taking differently: Take 800 mg by mouth at bedtime.  06/03/17 02/03/18  Elwin Mocha, MD  rivaroxaban (XARELTO) 20 MG TABS tablet Take 1 tablet (20 mg total) by mouth daily. 06/04/17   Shirley Friar, PA-C  rosuvastatin (CRESTOR) 40 MG tablet Take 1 tablet (40 mg total) by  mouth daily. 07/07/17   Georgiana Shore, NP  torsemide (DEMADEX) 20 MG tablet Take 4 tablets (80 mg total) by mouth daily. 09/13/17   Regalado, Cassie Freer, MD    ALLERGIES:  Allergies  Allergen Reactions  . Heparin Other (See Comments)    Documented HIT under problem list Pt states he's not allergic. " They told me not take it anymore"  . Spironolactone Other (See Comments)    Breast tenderness  . Thorazine [Chlorpromazine Hcl] Other (See Comments)    Body freezes up    SOCIAL HISTORY:  Social History   Tobacco Use  . Smoking status: Former Smoker    Packs/day: 1.00    Years: 30.00    Pack years: 30.00    Types: Cigarettes    Last attempt to quit: 06/24/2001    Years since quitting: 16.6  . Smokeless tobacco: Never Used  Substance Use Topics  . Alcohol use: Yes    Alcohol/week: 6.0 standard drinks    Types: 6 Glasses of wine per week    Comment: occasinally    FAMILY HISTORY: Family History  Problem Relation Age of Onset  . CAD Mother 23       deceased  . CAD Sister   . CAD Brother 49       died from MI at age 35yo  . Hypertension Other     EXAM: BP 107/84   Pulse (!) 107   Temp 98.2 F (36.8 C) (Oral)   Resp (!) 33   SpO2 93%  CONSTITUTIONAL: Alert and oriented and responds appropriately to questions.  Chronically ill-appearing HEAD: Normocephalic EYES: Conjunctivae clear, pupils appear equal, EOMI ENT: normal nose; moist mucous membranes NECK: Supple, no meningismus, no nuchal rigidity, no LAD  CARD: Regular and tachycardic; S1 and S2 appreciated; no murmurs, no clicks, no rubs, no gallops RESP: Normal chest excursion without splinting or tachypnea; breath sounds cool bilaterally with no wheezing or rhonchi, bibasilar rales noted, sats 88-90% on room air at rest, speaking full sentences ABD/GI: Normal bowel sounds; non-distended; soft, non-tender, no rebound, no guarding, no peritoneal signs, no hepatosplenomegaly BACK:  The back appears normal and is  non-tender to palpation, there is no CVA tenderness EXT: Normal ROM in all joints; non-tender to palpation; swelling noted to both lower extremities that appears equal, no calf tenderness, compartments soft, extremities warm and well-perfused    SKIN: Normal color for age and race; warm; no rash NEURO: Moves all extremities equally PSYCH: The patient's mood and manner are appropriate. Grooming and personal hygiene are appropriate.  MEDICAL DECISION MAKING: Patient here with symptoms of volume overload.  Troponin is positive here but appears to be his baseline likely from his history of chronic kidney disease and CHF.  EKG shows no new ischemic change.  Shows interstitial edema.  Given decreasing oxygen saturation and new oxygen requirement, will give IV Lasix for diuresis and admit.  ED PROGRESS: 1:36 AM Discussed patient's case with hospitalist, Dr. Myna Hidalgo.  I have recommended admission and patient (and family if present) agree with this plan. Admitting physician will place admission orders.   I reviewed all nursing notes, vitals, pertinent previous records, EKGs, lab and urine results, imaging (as available).       EKG Interpretation  Date/Time:  Tuesday February 08 2018 00:26:40 EST Ventricular Rate:  100 PR Interval:    QRS Duration: 121 QT Interval:  385 QTC Calculation: 497 R Axis:   -99 Text Interpretation:  Sinus tachycardia Atrial premature complexes in couplets Nonspecific IVCD with LAD Consider anterior infarct Nonspecific T abnormalities, lateral leads No significant change since last tracing Confirmed by Ward, Cyril Mourning [id number]) on 02/08/2018 12:54:54 AM         Ward, Delice Bison, DO 02/08/18 4132

## 2018-02-08 NOTE — Progress Notes (Signed)
Pt was transferred from ED, arrived to 4 East unit, alert and oriented x 4, tachypnea, SPO2 83 when ambulated in room. Room equipments instruction given, call bell with in reach, CHG bath given. CCMD verified EKG was sinus tachycardia on monitor. BP stable. O2 3 LPM of NCL given. Continue to monitor.  Kennyth Lose, BSN, RN [facility]

## 2018-02-08 NOTE — Progress Notes (Signed)
Pharmacy confirmed his home meds to the best of our ability. The patient is a poor historian and lives alone. He cannot recall the name of his second pharmacy that delivers some of his meds. He is also unable to recall the name of the doctor he sees.   Please contact pharmacy if further assistance is needed.   Romeo Rabon, PharmD. Mobile: [phone]. 02/08/2018,11:49 AM.

## 2018-02-08 NOTE — ED Triage Notes (Signed)
Per [facility], Pt from home. Pt complints of SHOB x 2 days, worse on exertion and lying down. Pt has hx of CHF, a-fib DM, and HTN. Pt reports dry cough for a couple months. Pt has edema to BLEs. Pt reports some chest tightness on exertion. Pt was 90% on RA. Pt placed on 4L [place] by EMS, increased to 100%.

## 2018-02-09 DIAGNOSIS — Z888 Allergy status to other drugs, medicaments and biological substances status: Secondary | ICD-10-CM | POA: Diagnosis not present

## 2018-02-09 DIAGNOSIS — Z87891 Personal history of nicotine dependence: Secondary | ICD-10-CM | POA: Diagnosis not present

## 2018-02-09 DIAGNOSIS — I130 Hypertensive heart and chronic kidney disease with heart failure and stage 1 through stage 4 chronic kidney disease, or unspecified chronic kidney disease: Secondary | ICD-10-CM | POA: Diagnosis present

## 2018-02-09 DIAGNOSIS — I509 Heart failure, unspecified: Secondary | ICD-10-CM

## 2018-02-09 DIAGNOSIS — N183 Chronic kidney disease, stage 3 (moderate): Secondary | ICD-10-CM | POA: Diagnosis present

## 2018-02-09 DIAGNOSIS — Z7901 Long term (current) use of anticoagulants: Secondary | ICD-10-CM | POA: Diagnosis not present

## 2018-02-09 DIAGNOSIS — I428 Other cardiomyopathies: Secondary | ICD-10-CM | POA: Diagnosis present

## 2018-02-09 DIAGNOSIS — Z794 Long term (current) use of insulin: Secondary | ICD-10-CM | POA: Diagnosis not present

## 2018-02-09 DIAGNOSIS — E781 Pure hyperglyceridemia: Secondary | ICD-10-CM | POA: Diagnosis present

## 2018-02-09 DIAGNOSIS — Z8673 Personal history of transient ischemic attack (TIA), and cerebral infarction without residual deficits: Secondary | ICD-10-CM | POA: Diagnosis not present

## 2018-02-09 DIAGNOSIS — I480 Paroxysmal atrial fibrillation: Secondary | ICD-10-CM | POA: Diagnosis present

## 2018-02-09 DIAGNOSIS — E785 Hyperlipidemia, unspecified: Secondary | ICD-10-CM | POA: Diagnosis present

## 2018-02-09 DIAGNOSIS — N179 Acute kidney failure, unspecified: Secondary | ICD-10-CM | POA: Diagnosis present

## 2018-02-09 DIAGNOSIS — E1169 Type 2 diabetes mellitus with other specified complication: Secondary | ICD-10-CM | POA: Diagnosis not present

## 2018-02-09 DIAGNOSIS — I5023 Acute on chronic systolic (congestive) heart failure: Secondary | ICD-10-CM | POA: Diagnosis not present

## 2018-02-09 DIAGNOSIS — K219 Gastro-esophageal reflux disease without esophagitis: Secondary | ICD-10-CM | POA: Diagnosis present

## 2018-02-09 DIAGNOSIS — I959 Hypotension, unspecified: Secondary | ICD-10-CM | POA: Diagnosis present

## 2018-02-09 DIAGNOSIS — F1011 Alcohol abuse, in remission: Secondary | ICD-10-CM | POA: Diagnosis present

## 2018-02-09 DIAGNOSIS — Z79899 Other long term (current) drug therapy: Secondary | ICD-10-CM | POA: Diagnosis not present

## 2018-02-09 DIAGNOSIS — F209 Schizophrenia, unspecified: Secondary | ICD-10-CM | POA: Diagnosis present

## 2018-02-09 DIAGNOSIS — E1122 Type 2 diabetes mellitus with diabetic chronic kidney disease: Secondary | ICD-10-CM | POA: Diagnosis present

## 2018-02-09 DIAGNOSIS — I5043 Acute on chronic combined systolic (congestive) and diastolic (congestive) heart failure: Secondary | ICD-10-CM | POA: Diagnosis present

## 2018-02-09 DIAGNOSIS — R7989 Other specified abnormal findings of blood chemistry: Secondary | ICD-10-CM | POA: Diagnosis present

## 2018-02-09 DIAGNOSIS — F1411 Cocaine abuse, in remission: Secondary | ICD-10-CM | POA: Diagnosis present

## 2018-02-09 LAB — GLUCOSE, CAPILLARY
Glucose-Capillary: 151 mg/dL — ABNORMAL HIGH (ref 70–99)
Glucose-Capillary: 163 mg/dL — ABNORMAL HIGH (ref 70–99)
Glucose-Capillary: 113 mg/dL — ABNORMAL HIGH (ref 70–99)
Glucose-Capillary: 143 mg/dL — ABNORMAL HIGH (ref 70–99)

## 2018-02-09 LAB — BASIC METABOLIC PANEL
ANION GAP: 11 (ref 5–15)
BUN: 24 mg/dL — ABNORMAL HIGH (ref 8–23)
CO2: 26 mmol/L (ref 22–32)
Calcium: 10 mg/dL (ref 8.9–10.3)
Chloride: 100 mmol/L (ref 98–111)
Creatinine, Ser: 1.52 mg/dL — ABNORMAL HIGH (ref 0.61–1.24)
GFR calc Af Amer: 56 mL/min — ABNORMAL LOW (ref >60)
GFR calc non Af Amer: 48 mL/min — ABNORMAL LOW (ref >60)
Glucose, Bld: 145 mg/dL — ABNORMAL HIGH (ref 70–99)
Potassium: 4.1 mmol/L (ref 3.5–5.1)
Sodium: 137 mmol/L (ref 135–145)

## 2018-02-09 MED ORDER — FUROSEMIDE 10 MG/ML IJ SOLN
60.0000 mg | Freq: Two times a day (BID) | INTRAMUSCULAR | Status: DC
  Administered 2018-02-10: 60 mg via INTRAVENOUS
  Filled 2018-02-09: qty 6

## 2018-02-09 MED ORDER — INSULIN GLARGINE 100 UNIT/ML ~~LOC~~ SOLN
20.0000 [IU] | Freq: Every day | SUBCUTANEOUS | Status: DC
  Administered 2018-02-09 – 2018-02-10 (×2): 20 [IU] via SUBCUTANEOUS
  Filled 2018-02-09 (×2): qty 0.2

## 2018-02-09 MED ORDER — ZOLPIDEM TARTRATE 5 MG PO TABS
5.0000 mg | ORAL_TABLET | Freq: Every evening | ORAL | Status: DC | PRN
  Administered 2018-02-09: 5 mg via ORAL
  Filled 2018-02-09: qty 1

## 2018-02-09 NOTE — Discharge Instructions (Signed)

## 2018-02-09 NOTE — Progress Notes (Signed)
PROGRESS NOTE    LEMOINE GOYNE  ZOX:[id number] DOB: November 06, 1955 DOA: 02/08/2018 PCP: [name], MD    Brief Narrative:  63 year old male who presented with orthopnea and dyspnea.  He does have significant past medical history for nonischemic cardiomyopathy ejection fraction 25 to 30%, type 2 diabetes mellitus, paroxysmal atrial fibrillation remote history of alcohol and cocaine abuse.  Patient reported worsening lower extremity edema, dyspnea on exertion and orthopnea for the last 7 days prior to hospitalization.  Currently he was seen in the emergency department January 9, received IV Lasix with only transitory improvement of his symptoms.  On his initial physical examination blood pressure 107/84, heart rate 107, respiratory rate 33, oxygen saturation 93%, he had moist mucous membranes, positive rails bilaterally, heart S1-S2 present and rhythmic, abdomen soft nontender, positive lower extremity edema 2+ pitting.  Sodium 134, potassium 3.6, chloride 1 1, bicarb 23, glucose 152, BUN 21, creatinine 1.41, BNP 243, troponin 0.12, white count 7.0, hemoglobin 12.2, hematocrit 39.7, platelets 230, chest x-ray with cardiomegaly and vascular hilar congestion, EKG sinus rhythm, right axis deviation, positive PAC, poor R wave progression, prolonged QRS (121) with nonspecific interventricular conduction delay.   Patient was admitted to the hospital working diagnosis of acute decompensated systolic heart failure, complicated by acute pulmonary edema and acute kidney injury.   Assessment & Plan:   Principal Problem:   Acute on chronic systolic CHF (congestive heart failure) (HCC) Active Problems:   Schizophrenia (HCC)   AF (paroxysmal atrial fibrillation) (HCC)   Type 2 diabetes mellitus (HCC)   Elevated troponin   1. Acute on chronic systolic heart failure decompensation complicated with acute cardiogenic pulmonary edema. Patient continue to be hypervolemic despite IV furosemide, will  continue aggressive diuresis. Urine output over last 24 hours 1,225 ml. Will continue heart failure management with carvedilol and digoxin. Blood pressure 93 to 811 systolic, will hold on afterload reduction. Hold on ace inh or arb due to risk of worsening renal function. Suspected medical non compliance.   2. AKI. Serum Cr continue to elevated at 1,41, baseline is 1,0. K today at 4.1 and serum bicarbonate at 26. Will continue aggressive diuresis with furosemide IV, follow on renal panel in am, avoid hypotension or nephrotoxic medications.   3. T2Dm. Will continue insulin sliding scale for glucose cover and monitoring, patient tolerating po well. Will reduce base insulin in 50% to prevent hypoglycemia. Today's fasting glucose 145.   4. Paroxysmal atrial fibrillation. Patient continue rate control with carvedilol, anticoagulation with rivaroxaban.   5. Dyslipidemia. Continue rosuvastatin.   6. Schizophrenia. Will continue quetiapine, and will add as needed ambien at night for sleep.    DVT prophylaxis: rivaroxaban.   Code Status: full Family Communication: no family at the bedside  Disposition Plan/ discharge barriers: Patient continue to be hypervolemic despite aggressive diuresis, positive hypotension, he has field outpatient therapy, will change to inpatient to continue aggressive medical therapy.   Body mass index is 5,634.38 kg/m. Malnutrition Type:      Malnutrition Characteristics:      Nutrition Interventions:     RN Pressure Injury Documentation:     Consultants:     Procedures:     Antimicrobials:       Subjective: Patient continue to have dyspnea and edema in his lower extremities, he has noticed only mild improvement, he is not yet back to baseline. No chest pain. Very weak and deconditioned. Asking for sleeping aid.   Objective: Vitals:   02/08/18 2016 02/09/18 0305 02/09/18  0947 02/09/18 1137  BP: 93/74 110/79  98/66  Pulse: 92 (!) 110 97 (!) 104   Resp: (!) 26 11  13   Temp: 98 F (36.7 C) 97.9 F (36.6 C)  97.6 F (36.4 C)  TempSrc: Oral Oral  Oral  SpO2: 96% 97%    Weight:  130.9 kg    Height:        Intake/Output Summary (Last 24 hours) at 02/09/2018 1620 Last data filed at 02/09/2018 1416 Gross per 24 hour  Intake 802 ml  Output 2125 ml  Net -1323 ml   Filed Weights   02/08/18 0600 02/09/18 0305  Weight: 129.5 kg 130.9 kg    Examination:   General: deconditioned and ill looking appearing  Neurology: Awake and alert, non focal  E ENT: mild pallor, no icterus, oral mucosa moist Cardiovascular: Positive JVD. S1-S2 present, rhythmic, no gallops, rubs, or murmurs. ++/ pitting lower extremity edema bilaterally. Pulmonary: decreased breath sounds bilaterally, specially at bases, no wheezing, but scattered rhonchi and rales. Gastrointestinal. Abdomen with no organomegaly, non tender, no rebound or guarding Skin. No rashes Musculoskeletal: no joint deformities     Data Reviewed: I have personally reviewed following labs and imaging studies  CBC: Recent Labs  Lab 02/03/18 0859 02/08/18 0029  WBC 7.7 7.0  NEUTROABS 5.2  --   HGB 12.5* 12.2*  HCT 42.5 39.7  MCV 95.9 93.0  PLT 222 924   Basic Metabolic Panel: Recent Labs  Lab 02/03/18 0859 02/08/18 0029 02/09/18 0333  NA 140 134* 137  K 3.4* 3.6 4.1  CL 102 101 100  CO2 30 23 26   GLUCOSE 152* 152* 145*  BUN 24* 21 24*  CREATININE 1.55* 1.41* 1.52*  CALCIUM 10.3 9.7 10.0   GFR: Estimated Creatinine Clearance: 5.5 mL/min (A) (by C-G formula based on SCr of 1.52 mg/dL (H)). Liver Function Tests: Recent Labs  Lab 02/03/18 0859  AST 46*  ALT 55*  ALKPHOS 102  BILITOT 0.7  PROT 8.0  ALBUMIN 4.0   No results for input(s): LIPASE, AMYLASE in the last 168 hours. No results for input(s): AMMONIA in the last 168 hours. Coagulation Profile: No results for input(s): INR, PROTIME in the last 168 hours. Cardiac Enzymes: Recent Labs  Lab  02/08/18 0029 02/08/18 0204 02/08/18 0712 02/08/18 1415  TROPONINI 0.12* 0.11* 0.13* 0.10*   BNP (last 3 results) No results for input(s): PROBNP in the last 8760 hours. HbA1C: No results for input(s): HGBA1C in the last 72 hours. CBG: Recent Labs  Lab 02/08/18 1105 02/08/18 1643 02/08/18 2134 02/09/18 0643 02/09/18 1136  GLUCAP 152* 143* 264* 151* 163*   Lipid Profile: No results for input(s): CHOL, HDL, LDLCALC, TRIG, CHOLHDL, LDLDIRECT in the last 72 hours. Thyroid Function Tests: No results for input(s): TSH, T4TOTAL, FREET4, T3FREE, THYROIDAB in the last 72 hours. Anemia Panel: No results for input(s): VITAMINB12, FOLATE, FERRITIN, TIBC, IRON, RETICCTPCT in the last 72 hours.    Radiology Studies: I have reviewed all of the imaging during this hospital visit personally     Scheduled Meds: . carvedilol  3.125 mg Oral BID WC  . cyclobenzaprine  10 mg Oral TID  . digoxin  0.125 mg Oral Daily  . furosemide  60 mg Intravenous Q12H  . insulin aspart  0-15 Units Subcutaneous TID WC  . insulin aspart  0-5 Units Subcutaneous QHS  . insulin glargine  20 Units Subcutaneous QHS  . QUEtiapine  800 mg Oral QHS  . rivaroxaban  20  mg Oral Daily  . rosuvastatin  40 mg Oral q1800  . sodium chloride flush  3 mL Intravenous Q12H   Continuous Infusions: . sodium chloride       LOS: 0 days        Mauricio Gerome Apley, MD [facility] Pager [phone]

## 2018-02-09 NOTE — Progress Notes (Signed)
PROGRESS NOTE  [name] [name] GGE:[id number] DOB: 1955/04/14 DOA: 02/08/2018 PCP: [name], MD  HPI/Brief Narrative  [name] [name] is a 63 y.o. year old male with medical history significant for CHF (last EF 25-30% in 08/2017), Type 2 DM, CKD stage II, Paroxysmal A fib, and Schizophrenia who presented on 02/08/2018 with 2-3 weeks of increasing SOB and dry cough and was admitted for acute on chronic CHF. He does not wear O2 at home. He has also noticed increased lower extremity swelling as well as orthopnea and PND. He states he typically can walk from the kitchen to his bedroom twice at home before getting out of breath. He denies any CP or palpitations.   Hospital course: In the ED, the patient O2 sat was in the low 90's on 2L of O2. He was tachypneic in the 30's. He received 80 mg IV Lasix in the ED and was started on IV Lasix 60 mg q12 hrs once admitted. Since being admitted, he has only been on O2 intermittently. He has been able to ambulate about 40 feet down the hallway with O2 sat dropping to 91 while ambulating.  Assessment/Plan:  1. Acute on Chronic CHF (LVEF 25-30% in 08/2017)- Patient is not tachypneic today and O2 sat has been above 90% without supplemental O2. He still has 2-3+ pitting edema in BLE. Will continue IV Lasix 60 mg q12 hrs and continue to monitor. His K was normal today. Will continue to monitor BMP. Urine output 900 mL today. Will continue to monitor daily weights and I's and O's. Will continue supplemental O2 prn.   2. Type 2 DM, On Insulin- Stable. Patient is on Lantus 40 units, Humalog 15 units TID, and Metformin at home. CBG 151 this morning. Will continue Lantus, but order 20 units instead. Will continue Novolog in place of his at home Humalog. Will continue to monitor CBG's.   3. Chronic Elevated Troponin- Patient's serial Troponin's elevated at 0.13 and 0.10. Pending 3rd troponin. Patient appears to have chronic elevated Troponin. He denies any CP  today. Continue cardiac monitoring, ASA, Rosuvastatin, and Carvedilol.   4. CKD stage II- His Cr was 1.41 on admission and is 1.55 today. Appears to be around baseline for him. Will continue to monitor daily BMP.   5. Paroxysmal A fib- Stable. Patient is being monitored on Telemetry. Will continue Xarelto and Digoxin. He denies any CP or palpitations today. He has been in sinus rhythm. Will continue Telemetry.   6. Schizophrenia- Stable. Will continue home Seroquel. Patient is requesting sleeping medication. Will order Ambien PRN.    Cultures:  none  Telemetry: Patient is on Telemetry   DVT prophylaxis: Lovenox  Consultants:  None    Procedures:  none   Antimicrobials:  Code Status: FULL   Family Communication: none   Disposition Plan: Will continue to monitor volume status and SOB. Anticipate discharge with clinical improvement.   Subjective Patient states overall his SOB and BLE swelling has felt improved today. He states he is still experiencing orthopnea. He is not wearing O2 today. He denies any CP or palpitations.   Objective: Vitals:   02/08/18 2016 02/09/18 0305 02/09/18 0947 02/09/18 1137  BP: 93/74 110/79  98/66  Pulse: 92 (!) 110 97 (!) 104  Resp: (!) 26 11  13   Temp: 98 F (36.7 C) 97.9 F (36.6 C)  97.6 F (36.4 C)  TempSrc: Oral Oral  Oral  SpO2: 96% 97%    Weight:  130.9 kg  Height:        Intake/Output Summary (Last 24 hours) at 02/09/2018 1417 Last data filed at 02/09/2018 1416 Gross per 24 hour  Intake 802 ml  Output 2125 ml  Net -1323 ml   Filed Weights   02/08/18 0600 02/09/18 0305  Weight: 129.5 kg 130.9 kg    Exam:  Constitutional:normal appearing male Eyes: PERRLA, anicteric, normal conjunctivae ENMT: Oropharynx with moist mucous membranes, normal dentition Neck: FROM, no evidence of JVD Cardiovascular: RRR no MRGs, 2-3+ pitting edema in BLE Respiratory: Normal respiratory effort, clear breath sounds  Abdomen:  Soft,non-tender, with no HSM Skin: No rash ulcers, or lesions. Without skin tenting  Neurologic: Grossly no focal neuro deficit. Psychiatric:Appropriate affect, and mood. Mental status AAOx3  Data Reviewed: CBC: Recent Labs  Lab 02/03/18 0859 02/08/18 0029  WBC 7.7 7.0  NEUTROABS 5.2  --   HGB 12.5* 12.2*  HCT 42.5 39.7  MCV 95.9 93.0  PLT 222 993   Basic Metabolic Panel: Recent Labs  Lab 02/03/18 0859 02/08/18 0029 02/09/18 0333  NA 140 134* 137  K 3.4* 3.6 4.1  CL 102 101 100  CO2 30 23 26   GLUCOSE 152* 152* 145*  BUN 24* 21 24*  CREATININE 1.55* 1.41* 1.52*  CALCIUM 10.3 9.7 10.0   GFR: Estimated Creatinine Clearance: 5.5 mL/min (A) (by C-G formula based on SCr of 1.52 mg/dL (H)). Liver Function Tests: Recent Labs  Lab 02/03/18 0859  AST 46*  ALT 55*  ALKPHOS 102  BILITOT 0.7  PROT 8.0  ALBUMIN 4.0   No results for input(s): LIPASE, AMYLASE in the last 168 hours. No results for input(s): AMMONIA in the last 168 hours. Coagulation Profile: No results for input(s): INR, PROTIME in the last 168 hours. Cardiac Enzymes: Recent Labs  Lab 02/08/18 0029 02/08/18 0204 02/08/18 0712 02/08/18 1415  TROPONINI 0.12* 0.11* 0.13* 0.10*   BNP (last 3 results) No results for input(s): PROBNP in the last 8760 hours. HbA1C: No results for input(s): HGBA1C in the last 72 hours. CBG: Recent Labs  Lab 02/08/18 1105 02/08/18 1643 02/08/18 2134 02/09/18 0643 02/09/18 1136  GLUCAP 152* 143* 264* 151* 163*   Lipid Profile: No results for input(s): CHOL, HDL, LDLCALC, TRIG, CHOLHDL, LDLDIRECT in the last 72 hours. Thyroid Function Tests: No results for input(s): TSH, T4TOTAL, FREET4, T3FREE, THYROIDAB in the last 72 hours. Anemia Panel: No results for input(s): VITAMINB12, FOLATE, FERRITIN, TIBC, IRON, RETICCTPCT in the last 72 hours. Urine analysis:    Component Value Date/Time   COLORURINE YELLOW 10/22/2017 [place] 10/22/2017 1403    LABSPEC 1.009 10/22/2017 1403   PHURINE 5.0 10/22/2017 1403   GLUCOSEU NEGATIVE 10/22/2017 1403   HGBUR NEGATIVE 10/22/2017 1403   BILIRUBINUR NEGATIVE 10/22/2017 1403   KETONESUR NEGATIVE 10/22/2017 1403   PROTEINUR 30 (A) 10/22/2017 1403   UROBILINOGEN 1.0 05/17/2014 1925   NITRITE NEGATIVE 10/22/2017 1403   LEUKOCYTESUR NEGATIVE 10/22/2017 1403   Sepsis Labs: @LABRCNTIP (procalcitonin:4,lacticidven:4)  )No results found for this or any previous visit (from the past 240 hour(s)).    Studies: No results found.  Scheduled Meds: . carvedilol  3.125 mg Oral BID WC  . cyclobenzaprine  10 mg Oral TID  . digoxin  0.125 mg Oral Daily  . furosemide  60 mg Intravenous Q12H  . insulin aspart  0-15 Units Subcutaneous TID WC  . insulin aspart  0-5 Units Subcutaneous QHS  . insulin glargine  20 Units Subcutaneous QHS  . QUEtiapine  800 mg Oral QHS  . rivaroxaban  20 mg Oral Daily  . rosuvastatin  40 mg Oral q1800  . sodium chloride flush  3 mL Intravenous Q12H    Continuous Infusions: . sodium chloride       LOS: 0 days    [name] Minus, PA-S

## 2018-02-10 LAB — GLUCOSE, CAPILLARY
Glucose-Capillary: 165 mg/dL — ABNORMAL HIGH (ref 70–99)
Glucose-Capillary: 171 mg/dL — ABNORMAL HIGH (ref 70–99)
Glucose-Capillary: 227 mg/dL — ABNORMAL HIGH (ref 70–99)
Glucose-Capillary: 136 mg/dL — ABNORMAL HIGH (ref 70–99)
Glucose-Capillary: 136 mg/dL — ABNORMAL HIGH (ref 70–99)

## 2018-02-10 LAB — BASIC METABOLIC PANEL
Anion gap: 8 (ref 5–15)
BUN: 25 mg/dL — ABNORMAL HIGH (ref 8–23)
CO2: 29 mmol/L (ref 22–32)
CREATININE: 1.53 mg/dL — AB (ref 0.61–1.24)
Calcium: 10.2 mg/dL (ref 8.9–10.3)
Chloride: 101 mmol/L (ref 98–111)
GFR calc Af Amer: 55 mL/min — ABNORMAL LOW (ref >60)
GFR calc non Af Amer: 48 mL/min — ABNORMAL LOW (ref >60)
Glucose, Bld: 149 mg/dL — ABNORMAL HIGH (ref 70–99)
Potassium: 3.7 mmol/L (ref 3.5–5.1)
Sodium: 138 mmol/L (ref 135–145)

## 2018-02-10 MED ORDER — SPIRONOLACTONE 25 MG PO TABS
25.0000 mg | ORAL_TABLET | Freq: Every day | ORAL | Status: DC
  Administered 2018-02-10 – 2018-02-11 (×2): 25 mg via ORAL
  Filled 2018-02-10 (×2): qty 1

## 2018-02-10 MED ORDER — TORSEMIDE 20 MG PO TABS
40.0000 mg | ORAL_TABLET | Freq: Every day | ORAL | Status: DC
  Administered 2018-02-11: 40 mg via ORAL
  Filled 2018-02-10: qty 2

## 2018-02-10 MED ORDER — ISOSORB DINITRATE-HYDRALAZINE 20-37.5 MG PO TABS
1.0000 | ORAL_TABLET | Freq: Three times a day (TID) | ORAL | Status: DC
  Administered 2018-02-10 – 2018-02-11 (×3): 1 via ORAL
  Filled 2018-02-10 (×3): qty 1

## 2018-02-10 MED ORDER — BISACODYL 5 MG PO TBEC
10.0000 mg | DELAYED_RELEASE_TABLET | Freq: Once | ORAL | Status: AC
  Administered 2018-02-10: 10 mg via ORAL
  Filled 2018-02-10: qty 2

## 2018-02-10 NOTE — Progress Notes (Signed)
PROGRESS NOTE  [name] [name] LZJ:[id number] DOB: [birth year] DOA: 02/08/2018 PCP: [name], MD  HPI/Brief Narrative  [name] [name] is a 63 y.o. year old male with medical history significant for CHF (last EF 25-30% in 08/2017), Type 2 DM, CKD stage II, Paroxsymal A fib, and Schizophrenia who presented on 02/08/2018 with 2-3 weeks of increasing SOB and dry cough and was admitted for acute on chronic CHF. He does not wear O2 at home. He has also noticed increased lower extremity swelling as well as orthopnea and PND. He states he typically can walk from the kitchen to his bedroom twice at home before getting out of breath. He denies any CP or palpitations.   Hospital Course: In the ED, the patient O2 sat was in the low 90's on 2L of O2. He was tachypneic in the 30's. He received 80 mg IV Lasix in the ED and was started on IV Lasix 60 mg q12 hrs once admitted. Since being admitted, he has only been on O2 intermittently. He has not used supplemental O2 today or yesterday. He has been ambulating in the hallway with his walker without difficulty. Urine output was 2,350 ml yesterday and has been 850 ml today.   Assessment/Plan:  1. Acute on Chronic CHF (LVEF 25-30% in 08/2017)- Patient has been on 80 mg Torsemide and Elperenone 25 mg daily at home. Patient is not tachypneic today and O2 sat has been above 90% without supplemental O2. His pitting edema has improved to 1-2+ today (was 2-3+ yesterday). His K remains normal at 3.7. Will continue to monitor BMP. He has been on IV Lasix 60 mg q12 hrs. We will transition him to oral Torsemide 40 mg starting tomorrow and give him 1 dose of Aldactone tomorrow as well. We will continue him on his home Elperenone 25 mg at discharge. We will also start him on BiDil. Urine output 2,350 mL yesterday and 850 mL thus far today today. His weight today is 130 kg, slightly decreased from 130.863 yesterday. Will continue to monitor daily weights and I's and O's.  Will continue supplemental O2 prn.   2. Type 2 DM, On Insulin- Stable. Patient is on Lantus 40 units, Humalog 15 units TID, and Metformin at home. CBG 165 this morning. Will continue Lantus, but order 20 units instead. Will continue Novolog in place of his at home Humalog. Will continue to monitor CBG's.   3. Chronic Elevated Troponin- Patient's serial Troponin's elevated at 0.13 and 0.10. Pending 3rd troponin. Patient appears to have chronic elevated Troponin. He denies any CP today. Continue cardiac monitoring, ASA, Rosuvastatin, and Carvedilol.   4. CKD stage II- His Cr was 1.41 on admission and is 1.53 today. Appears to be around baseline for him. Will continue to monitor daily BMP.   5. Paroxysmal A fib- Stable. Patient is being monitored on Telemetry. Will continue Xarelto and Digoxin. He denies any CP or palpitations today. He has been in sinus rhythm. Will continue Telemetry.   6. Schizophrenia- Stable. Will continue home Seroquel. Continue Ambien PRN for patient's sleeping.   Cultures:  none  Telemetry: Patient is on Telemetry   DVT prophylaxis: Lovenox  Consultants:  none   Procedures:  none   Antimicrobials:  Code Status: FULL   Family Communication: none   Disposition Plan: Anticipate discharge tomorrow pending continued improvement with continued diuresis.   Subjective Patient states he is doing well today. His SOB continues to be improving with diuresis. He is still experiencing a  dry cough. He states he has been up and walking around the floor with his walker today without difficulty. He has not used supplemental oxygen today or yesterday. He also feels as though the swelling in his bilateral lower extremities has continued to improve. He denies any CP or palpitations.  Objective: Vitals:   02/09/18 0947 02/09/18 1137 02/09/18 2030 02/10/18 0513  BP:  98/66 (!) 116/93 120/90  Pulse: 97 (!) 104 90 92  Resp:  13 (!) 25 (!) 25  Temp:  97.6 F (36.4 C)  97.6 F (36.4 C) (!) 97.3 F (36.3 C)  TempSrc:  Oral Oral Oral  SpO2:   99% 99%  Weight:    130 kg  Height:        Intake/Output Summary (Last 24 hours) at 02/10/2018 1325 Last data filed at 02/10/2018 1100 Gross per 24 hour  Intake 390 ml  Output 3100 ml  Net -2710 ml   Filed Weights   02/08/18 0600 02/09/18 0305 02/10/18 0513  Weight: 129.5 kg 130.9 kg 130 kg    Exam:  Constitutional:normal appearing male Eyes: EOMI, anicteric, normal conjunctivae ENMT: Oropharynx with moist mucous membranes Neck: FROM Cardiovascular: RRR no MRGs, 1-2+ pitting edema in BLE, no JVD, DP and PT pulses 2+ bilaterally. Respiratory: Normal respiratory effort, clear breath sounds  Abdomen: Soft,non-tender, with no HSM Skin: No rash ulcers, or lesions. Without skin tenting  Neurologic: Grossly no focal neuro deficit. Psychiatric:Appropriate affect, and mood. Mental status AAOx3  Data Reviewed: CBC: Recent Labs  Lab 02/08/18 0029  WBC 7.0  HGB 12.2*  HCT 39.7  MCV 93.0  PLT 962   Basic Metabolic Panel: Recent Labs  Lab 02/08/18 0029 02/09/18 0333 02/10/18 0435  NA 134* 137 138  K 3.6 4.1 3.7  CL 101 100 101  CO2 23 26 29   GLUCOSE 152* 145* 149*  BUN 21 24* 25*  CREATININE 1.41* 1.52* 1.53*  CALCIUM 9.7 10.0 10.2   GFR: Estimated Creatinine Clearance: 5.2 mL/min (A) (by C-G formula based on SCr of 1.53 mg/dL (H)). Liver Function Tests: No results for input(s): AST, ALT, ALKPHOS, BILITOT, PROT, ALBUMIN in the last 168 hours. No results for input(s): LIPASE, AMYLASE in the last 168 hours. No results for input(s): AMMONIA in the last 168 hours. Coagulation Profile: No results for input(s): INR, PROTIME in the last 168 hours. Cardiac Enzymes: Recent Labs  Lab 02/08/18 0029 02/08/18 0204 02/08/18 0712 02/08/18 1415  TROPONINI 0.12* 0.11* 0.13* 0.10*   BNP (last 3 results) No results for input(s): PROBNP in the last 8760 hours. HbA1C: No results for input(s): HGBA1C in  the last 72 hours. CBG: Recent Labs  Lab 02/09/18 1136 02/09/18 1633 02/09/18 2059 02/10/18 0610 02/10/18 1100  GLUCAP 163* 113* 143* 165* 171*   Lipid Profile: No results for input(s): CHOL, HDL, LDLCALC, TRIG, CHOLHDL, LDLDIRECT in the last 72 hours. Thyroid Function Tests: No results for input(s): TSH, T4TOTAL, FREET4, T3FREE, THYROIDAB in the last 72 hours. Anemia Panel: No results for input(s): VITAMINB12, FOLATE, FERRITIN, TIBC, IRON, RETICCTPCT in the last 72 hours. Urine analysis:    Component Value Date/Time   COLORURINE YELLOW 10/22/2017 [place] 10/22/2017 1403   LABSPEC 1.009 10/22/2017 1403   PHURINE 5.0 10/22/2017 1403   GLUCOSEU NEGATIVE 10/22/2017 1403   HGBUR NEGATIVE 10/22/2017 1403   BILIRUBINUR NEGATIVE 10/22/2017 1403   KETONESUR NEGATIVE 10/22/2017 1403   PROTEINUR 30 (A) 10/22/2017 1403   UROBILINOGEN 1.0 05/17/2014 1925   NITRITE  NEGATIVE 10/22/2017 1403   LEUKOCYTESUR NEGATIVE 10/22/2017 1403   Sepsis Labs: @LABRCNTIP (procalcitonin:4,lacticidven:4)  )No results found for this or any previous visit (from the past 240 hour(s)).    Studies: No results found.  Scheduled Meds: . carvedilol  3.125 mg Oral BID WC  . cyclobenzaprine  10 mg Oral TID  . digoxin  0.125 mg Oral Daily  . furosemide  60 mg Intravenous BID  . insulin aspart  0-15 Units Subcutaneous TID WC  . insulin aspart  0-5 Units Subcutaneous QHS  . insulin glargine  20 Units Subcutaneous QHS  . QUEtiapine  800 mg Oral QHS  . rivaroxaban  20 mg Oral Daily  . rosuvastatin  40 mg Oral q1800  . sodium chloride flush  3 mL Intravenous Q12H    Continuous Infusions: . sodium chloride       LOS: 1 day   Romie Minus, PA-S

## 2018-02-10 NOTE — Progress Notes (Signed)
PROGRESS NOTE    [name] [name]  JWJ:[id number] DOB: 05/13/1955 DOA: 02/08/2018 PCP: [name], MD    Brief Narrative:  63 year old male who presented with orthopnea and dyspnea.  He does have significant past medical history for nonischemic cardiomyopathy ejection fraction 25 to 30%, type 2 diabetes mellitus, paroxysmal atrial fibrillation remote history of alcohol and cocaine abuse.  Patient reported worsening lower extremity edema, dyspnea on exertion and orthopnea for the last 7 days prior to hospitalization.  Currently he was seen in the emergency department January 9, received IV Lasix with only transitory improvement of his symptoms.  On his initial physical examination blood pressure 107/84, heart rate 107, respiratory rate 33, oxygen saturation 93%, he had moist mucous membranes, positive rails bilaterally, heart S1-S2 present and rhythmic, abdomen soft nontender, positive lower extremity edema 2+ pitting.  Sodium 134, potassium 3.6, chloride 1 1, bicarb 23, glucose 152, BUN 21, creatinine 1.41, BNP 243, troponin 0.12, white count 7.0, hemoglobin 12.2, hematocrit 39.7, platelets 230, chest x-ray with cardiomegaly and vascular hilar congestion, EKG sinus rhythm, right axis deviation, positive PAC, poor R wave progression, prolonged QRS (121) with nonspecific interventricular conduction delay.   Patient was admitted to the hospital working diagnosis of acute decompensated systolic heart failure, complicated by acute pulmonary edema and acute kidney injury   Assessment & Plan:   Principal Problem:   Acute on chronic systolic CHF (congestive heart failure) (HCC) Active Problems:   Schizophrenia (HCC)   AF (paroxysmal atrial fibrillation) (HCC)   Type 2 diabetes mellitus (HCC)   Elevated troponin   Heart failure ([place])   1. Acute on chronic systolic heart failure decompensation complicated with acute cardiogenic pulmonary edema. Improved volume status but still not back  to baseline, will transition in am to oral torsemide, will add hydralazine isosrbide and spironolactone for heart failure management. Urine output over last 24 hours 2,350 ml.   2. AKI. Serum Cr continue continue to be stable at 1.53 with k at 3,7 and serum bicarbonate 29. Will follow on renal panel in am. Avoid hypotension and nephrotoxic medications, continue diuretics and target a negative fluid balance.  3. T2Dm. On insulin sliding scale for glucose cover and monitoring, patient tolerating po well. Fasting glucose today 149. Continue with reduced dose of insulin levimir.   4. Paroxysmal atrial fibrillation.  Rate controlled with carvedilol, continue anticoagulation with rivaroxaban.   5. Dyslipidemia. On rosuvastatin.   6. Schizophrenia. On quetiapine per home regimen and as needed ambien at night for sleep.    DVT prophylaxis: rivaroxaban.   Code Status: full Family Communication: no family at the bedside  Disposition Plan/ discharge barriers: possible discharge in 24 to 48 hours.  Body mass index is 5,597.24 kg/m. Malnutrition Type:      Malnutrition Characteristics:      Nutrition Interventions:     RN Pressure Injury Documentation:     Consultants:     Procedures:     Antimicrobials:       Subjective: Patient with improvement of his symptoms but not yet back to baseline, continue to have dyspnea and lower extremity edema, no nausea or vomiting, no chest pain.   Objective: Vitals:   02/09/18 1137 02/09/18 2030 02/10/18 0513 02/10/18 1324  BP: 98/66 (!) 116/93 120/90 (!) 115/95  Pulse: (!) 104 90 92 92  Resp: 13 (!) 25 (!) 25 (!) 21  Temp: 97.6 F (36.4 C) 97.6 F (36.4 C) (!) 97.3 F (36.3 C) (!) 97.5 F (36.4 C)  TempSrc: Oral Oral Oral Oral  SpO2:  99% 99% 96%  Weight:   130 kg   Height:        Intake/Output Summary (Last 24 hours) at 02/10/2018 1700 Last data filed at 02/10/2018 1300 Gross per 24 hour  Intake 480 ml  Output  2300 ml  Net -1820 ml   Filed Weights   02/08/18 0600 02/09/18 0305 02/10/18 0513  Weight: 129.5 kg 130.9 kg 130 kg    Examination:   General: deconditioned  Neurology: Awake and alert, non focal  E ENT: no pallor, no icterus, oral mucosa moist Cardiovascular: No JVD. S1-S2 present, rhythmic, no gallops, rubs, or murmurs. ++ pitting lower extremity edema. Pulmonary: positive breath sounds bilaterally, adequate air movement, no wheezing,scattered rhonchi and rales. Gastrointestinal. Abdomen with no organomegaly, non tender, no rebound or guarding Skin. No rashes Musculoskeletal: no joint deformities     Data Reviewed: I have personally reviewed following labs and imaging studies  CBC: Recent Labs  Lab 02/08/18 0029  WBC 7.0  HGB 12.2*  HCT 39.7  MCV 93.0  PLT 034   Basic Metabolic Panel: Recent Labs  Lab 02/08/18 0029 02/09/18 0333 02/10/18 0435  NA 134* 137 138  K 3.6 4.1 3.7  CL 101 100 101  CO2 23 26 29   GLUCOSE 152* 145* 149*  BUN 21 24* 25*  CREATININE 1.41* 1.52* 1.53*  CALCIUM 9.7 10.0 10.2   GFR: Estimated Creatinine Clearance: 5.2 mL/min (A) (by C-G formula based on SCr of 1.53 mg/dL (H)). Liver Function Tests: No results for input(s): AST, ALT, ALKPHOS, BILITOT, PROT, ALBUMIN in the last 168 hours. No results for input(s): LIPASE, AMYLASE in the last 168 hours. No results for input(s): AMMONIA in the last 168 hours. Coagulation Profile: No results for input(s): INR, PROTIME in the last 168 hours. Cardiac Enzymes: Recent Labs  Lab 02/08/18 0029 02/08/18 0204 02/08/18 0712 02/08/18 1415  TROPONINI 0.12* 0.11* 0.13* 0.10*   BNP (last 3 results) No results for input(s): PROBNP in the last 8760 hours. HbA1C: No results for input(s): HGBA1C in the last 72 hours. CBG: Recent Labs  Lab 02/09/18 1633 02/09/18 2059 02/10/18 0610 02/10/18 1100 02/10/18 1337  GLUCAP 113* 143* 165* 171* 227*   Lipid Profile: No results for input(s): CHOL,  HDL, LDLCALC, TRIG, CHOLHDL, LDLDIRECT in the last 72 hours. Thyroid Function Tests: No results for input(s): TSH, T4TOTAL, FREET4, T3FREE, THYROIDAB in the last 72 hours. Anemia Panel: No results for input(s): VITAMINB12, FOLATE, FERRITIN, TIBC, IRON, RETICCTPCT in the last 72 hours.    Radiology Studies: I have reviewed all of the imaging during this hospital visit personally     Scheduled Meds: . bisacodyl  10 mg Oral Once  . carvedilol  3.125 mg Oral BID WC  . cyclobenzaprine  10 mg Oral TID  . digoxin  0.125 mg Oral Daily  . insulin aspart  0-15 Units Subcutaneous TID WC  . insulin aspart  0-5 Units Subcutaneous QHS  . insulin glargine  20 Units Subcutaneous QHS  . isosorbide-hydrALAZINE  1 tablet Oral TID  . QUEtiapine  800 mg Oral QHS  . rivaroxaban  20 mg Oral Daily  . rosuvastatin  40 mg Oral q1800  . sodium chloride flush  3 mL Intravenous Q12H  . spironolactone  25 mg Oral Daily  . [START ON 02/11/2018] torsemide  40 mg Oral Daily   Continuous Infusions: . sodium chloride       LOS: 1 day  Morelia Cassells Gerome Apley, MD [facility] Pager [phone]

## 2018-02-11 LAB — BASIC METABOLIC PANEL
Anion gap: 7 (ref 5–15)
BUN: 25 mg/dL — ABNORMAL HIGH (ref 8–23)
CHLORIDE: 102 mmol/L (ref 98–111)
CO2: 30 mmol/L (ref 22–32)
CREATININE: 1.53 mg/dL — AB (ref 0.61–1.24)
Calcium: 10.1 mg/dL (ref 8.9–10.3)
GFR calc Af Amer: 55 mL/min — ABNORMAL LOW (ref >60)
GFR calc non Af Amer: 48 mL/min — ABNORMAL LOW (ref >60)
Glucose, Bld: 153 mg/dL — ABNORMAL HIGH (ref 70–99)
Potassium: 4 mmol/L (ref 3.5–5.1)
Sodium: 139 mmol/L (ref 135–145)

## 2018-02-11 LAB — GLUCOSE, CAPILLARY: Glucose-Capillary: 151 mg/dL — ABNORMAL HIGH (ref 70–99)

## 2018-02-11 MED ORDER — INSULIN GLARGINE 100 UNIT/ML ~~LOC~~ SOLN: 20.0000 [IU] | Freq: Every day | SUBCUTANEOUS | 0 refills | Status: AC

## 2018-02-11 MED ORDER — ISOSORB DINITRATE-HYDRALAZINE 20-37.5 MG PO TABS: 1.0000 | ORAL_TABLET | Freq: Three times a day (TID) | ORAL | 0 refills | Status: DC

## 2018-02-11 NOTE — Consult Note (Signed)
   [facility] CM Inpatient Consult   02/11/2018  PATRICE MOATES 01-Mar-1955 [id number]   Patient screened and found that patient is in a [place] program for Advanced Illness telephonic program. Spoke with inpatient RNCM regarding patient in an external care management program with [facility] Medicare.   She states he will have home health care and that he had said he is receiving calls regarding his chronic disease. For questions, please contact:  Natividad Brood, RN BSN [place] [facility] Liaison  [phone] business mobile phone Toll free office [phone]

## 2018-02-11 NOTE — Discharge Summary (Signed)
Physician Discharge Summary  [name] [name] DXI:[id number] DOB: 12-31-1955 DOA: 02/08/2018  PCP: [name], MD  Admit date: 02/08/2018 Discharge date: 02/11/2018  Admitted From: Home  Disposition:  Home   Recommendations for Outpatient Follow-up and new medication changes:  1. Follow up with Dr. Fara Olden in 7 days.  2. Follow up with [facility]. 3. Patient has been placed on Bidil for heart failure.  4. Resumed torsemide 80 mg daily. 5. Please follow renal panel in 7 days.  6. Patient has been not taking eplerenone, for now will hold due to risk of hyperkalemia.  7. Reduced dose of long acting insulin due to risk of hypoglycemia.   Home Health: yes   Equipment/Devices: no    Discharge Condition: stable  CODE STATUS:full  Diet recommendation: heart healthy, low salt 2 g per day, and fluid restriction 1000 ml per day.   Brief/Interim Summary: 63 year old male who presented with orthopnea and dyspnea. He does have significant past medical history for nonischemic cardiomyopathy ejection fraction 25 to 30%, type 2 diabetes mellitus, paroxysmal atrial fibrillation, remote history of alcohol and cocaine abuse. Patient reported worsening lower extremity edema, dyspnea on exertion and orthopnea for the last 7 days prior to hospitalization. He was seen in the emergency department January 9, received IV Lasix, and discharged home,  with only transitory improvement of his symptoms.On his initial physical examination blood pressure 107/84, heart rate 107, respiratory rate 33,oxygen saturation 93%, he had moist mucous membranes, positive rales bilaterally, heart S1-S2 present and rhythmic, abdomen soft nontender, positive lower extremity edema 2+ pitting. Sodium 134, potassium 3.6, chloride 1 1, bicarb 23, glucose 152, BUN 21, creatinine 1.41, BNP 243, troponin 0.12, white count 7.0, hemoglobin 12.2, hematocrit 39.7, platelets 230,chest x-ray with cardiomegaly and  vascular hilar congestion,EKG sinus rhythm, right axis deviation, positive PAC, poor R wave progression, prolonged QRS(121)with nonspecific interventricular conduction delay.  Patient was admitted to the hospital with the working diagnosis of acute decompensated systolic heart failure, complicated by acute pulmonary edema and acute kidney injury  1.  Acute on chronic systolic heart failure decompensation, complicated by acute cardiogenic pulmonary edema.  Patient was admitted to the medical ward, he was placed on a remote telemetry monitor, received aggressive diuresis with IV furosemide, negative fluid balance was achieved with significant improvement of his symptoms.  Patient will be discharged on BiDil for afterload reduction, unable to use ACE inhibitor's or ARB due to decreased GFR with AKI, continue beta-blockade and digoxin.  He needs a close follow-up as an outpatient, I am concerned that he is not compliant with medical therapy or a salt/fluid restricted diet.  He does have low ejection fraction and prolonged QRS may qualify him for advanced heart failure therapy.  He follows up with the outpatient cardiology clinic, last visit November 2019.   2.  Paroxysmal atrial fibrillation.  Patient remains in sinus rhythm, continue carvedilol for rate control and anticoagulation with rivaroxaban.  3.  Acute kidney injury chronic kidney disease stage III.  Patient received IV furosemide, his creatinine has remained stable at 1.5, will continue torsemide 80 mg daily, will need follow-up kidney function as an outpatient.  4.  Type 2 diabetes mellitus.  Patient was placed on insulin sliding scale for glucose monitoring, his dose of Levemir was reduced to avoid hypoglycemia.  At home he will continue with a lower dose to prevent hypoglycemia in the setting of reduced GFR. Continue metformin for now.   5. Dyslipidemia. Continue rosuvastatin.  6. Schizophrenia.  Continue quetiapine.   Discharge  Diagnoses:  Principal Problem:   Acute on chronic systolic CHF (congestive heart failure) (HCC) Active Problems:   Schizophrenia (HCC)   AF (paroxysmal atrial fibrillation) (HCC)   Type 2 diabetes mellitus (HCC)   Elevated troponin   Heart failure ([place])    Discharge Instructions   Allergies as of 02/11/2018      Reactions   Heparin Other (See Comments)   Documented HIT under problem list Pt states he's not allergic. " They told me not take it anymore"   Spironolactone Other (See Comments)   Breast tenderness   Thorazine [chlorpromazine Hcl] Other (See Comments)   Body freezes up      Medication List    STOP taking these medications   cyclobenzaprine 10 MG tablet Commonly known as:  FLEXERIL   eplerenone 25 MG tablet Commonly known as:  INSPRA     TAKE these medications   ACCU-CHEK FASTCLIX LANCETS Misc Check blood sugar 4 times a day before meals and bedtime   ACCU-CHEK GUIDE w/Device Kit 1 each by Does not apply route 4 (four) times daily.   allopurinol 100 MG tablet Commonly known as:  ZYLOPRIM Take 1 tablet (100 mg total) by mouth daily.   B-D UF III MINI PEN NEEDLES 31G X 5 MM Misc Generic drug:  Insulin Pen Needle Use four times daily as directed. Dx code E11.00, Z79.4   carvedilol 3.125 MG tablet Commonly known as:  COREG Take 1 tablet (3.125 mg total) by mouth 2 (two) times daily with a meal.   digoxin 0.125 MG tablet Commonly known as:  LANOXIN Take 1 tablet (0.125 mg total) by mouth daily.   glucose blood test strip Commonly known as:  ACCU-CHEK GUIDE Check blood sugar 4 times a day before meal and bedtime   insulin glargine 100 UNIT/ML injection Commonly known as:  LANTUS Inject 0.2 mLs (20 Units total) into the skin at bedtime for 30 days. What changed:  how much to take   insulin lispro 100 UNIT/ML injection Commonly known as:  HUMALOG Inject 15 Units into the skin 3 (three) times daily before meals.   isosorbide-hydrALAZINE 20-37.5  MG tablet Commonly known as:  BIDIL Take 1 tablet by mouth 3 (three) times daily for 30 days.   metFORMIN 500 MG tablet Commonly known as:  GLUCOPHAGE Take 500 mg by mouth daily with breakfast.   QUEtiapine 400 MG tablet Commonly known as:  SEROQUEL Take 1 tablet (400 mg total) by mouth 2 (two) times daily for 21 days. What changed:    how much to take  when to take this   rivaroxaban 20 MG Tabs tablet Commonly known as:  XARELTO Take 1 tablet (20 mg total) by mouth daily.   rosuvastatin 40 MG tablet Commonly known as:  CRESTOR Take 1 tablet (40 mg total) by mouth daily.   torsemide 20 MG tablet Commonly known as:  DEMADEX Take 4 tablets (80 mg total) by mouth daily.   TRESIBA FLEXTOUCH 200 UNIT/ML Sopn Generic drug:  Insulin Degludec Inject 100 Units into the skin every Wednesday.       Allergies  Allergen Reactions  . Heparin Other (See Comments)    Documented HIT under problem list Pt states he's not allergic. " They told me not take it anymore"  . Spironolactone Other (See Comments)    Breast tenderness  . Thorazine [Chlorpromazine Hcl] Other (See Comments)    Body freezes up    Consultations:  Procedures/Studies: Dg Chest 2 View  Result Date: 02/08/2018 CLINICAL DATA:  63 y/o M; chest pain, shortness of breath, history of congestive heart failure. EXAM: CHEST - 2 VIEW COMPARISON:  02/03/2018 chest radiograph and CT chest. FINDINGS: Stable cardiomegaly. Aortic atherosclerosis with calcification. Stable diffuse reticular opacities of the lungs. No focal consolidation. No pleural effusion or pneumothorax. No acute osseous abnormality is evident. IMPRESSION: Stable cardiomegaly. Reticular opacities similar to prior studies, likely interstitial edema. Electronically Signed   By: Kristine Garbe M.D.   On: 02/08/2018 01:00   Dg Chest 2 View  Result Date: 02/03/2018 CLINICAL DATA:  Flu-like symptoms.  Shortness of breath. EXAM: CHEST - 2 VIEW  COMPARISON:  Chest x-ray 12/27/2017. FINDINGS: Cardiomegaly with diffuse bilateral from interstitial prominence consistent with CHF. Pneumonitis can not be excluded. Improved aeration of both lungs noted from 12/27/2017. No pleural effusion or pneumothorax. IMPRESSION: Cardiomegaly with diffuse bilateral from interstitial prominence suggesting CHF. Pneumonitis can not be excluded. Improved aeration of both lungs noted from 12/27/2017. Electronically Signed   By: Marcello Moores  Register   On: 02/03/2018 10:44   Ct Chest Wo Contrast  Result Date: 02/03/2018 CLINICAL DATA:  Flu like symptoms for 4 days. Exertional dyspnea. Productive cough. EXAM: CT CHEST WITHOUT CONTRAST TECHNIQUE: Multidetector CT imaging of the chest was performed following the standard protocol without IV contrast. COMPARISON:  Chest radiograph 02/03/2018 FINDINGS: Cardiovascular: Enlarged cardiac silhouette. Small pericardial effusion. Normal contour of the great vessels. Minimal calcific atherosclerotic disease of the coronary arteries. Mediastinum/Nodes: Mild right-sided mediastinal lymphadenopathy with the largest paratracheal lymph node measuring 13 mm in short axis. Normal appearance of the thyroid gland, esophagus and trachea. Lungs/Pleura: No evidence of lobar consolidation, pleural effusion or pneumothorax. Minimal interstitial pulmonary edema. Linear atelectatic changes in the right middle lobe and lingula. Upper Abdomen: Numerous cystic lesions and rim calcified lesions throughout the spleen with benign appearance. Musculoskeletal: No chest wall mass or suspicious bone lesions identified. 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 IMPRESSION: No evidence of lobar consolidation. Minimal interstitial pulmonary edema. Cardiomegaly with small pericardial effusion. Mild right-sided mediastinal lymphadenopathy, etiology uncertain. Follow-up in 6-12 months may be considered. Electronically Signed   By: Fidela Salisbury M.D.   On: 02/03/2018 11:52       Subjective: Patient is feeling well, back to his baseline, no chest pain and dyspnea has improved, no nausea or vomiting, continue to improve lower extremity edema.   Discharge Exam: Vitals:   02/11/18 0848 02/11/18 0850  BP:  116/82  Pulse:  92  Resp:  (!) 26  Temp: 98.2 F (36.8 C) 98.2 F (36.8 C)  SpO2:  94%   Vitals:   02/11/18 0643 02/11/18 0703 02/11/18 0848 02/11/18 0850  BP: 98/76   116/82  Pulse: (!) 109   92  Resp: (!) 34   (!) 26  Temp:  97.6 F (36.4 C) 98.2 F (36.8 C) 98.2 F (36.8 C)  TempSrc:  Oral Oral Oral  SpO2: 98%   94%  Weight: 129.7 kg     Height:        General: deconditioned  Neurology: Awake and alert, non focal  E ENT: mild pallor, no icterus, oral mucosa moist Cardiovascular: No JVD. S1-S2 present, rhythmic, no gallops, rubs, or murmurs. +/ non pitting lower extremity edema. Pulmonary: positive breath sounds bilaterally, adequate air movement, no wheezing, rhonchi or rales. Gastrointestinal. Abdomen protuberant with no organomegaly, non tender, no rebound or guarding Skin. Superficial lower extremity wounds.  Musculoskeletal: no joint deformities   The results of significant diagnostics from this hospitalization (including imaging, microbiology, ancillary and laboratory) are listed below for reference.     Microbiology: No results found for this or any previous visit (from the past 240 hour(s)).   Labs: BNP (last 3 results) Recent Labs    12/27/17 1726 02/03/18 0859 02/08/18 0029  BNP 357.1* 153.2* 143.8*   Basic Metabolic Panel: Recent Labs  Lab 02/08/18 0029 02/09/18 0333 02/10/18 0435 02/11/18 0241  NA 134* 137 138 139  K 3.6 4.1 3.7 4.0  CL 101 100 101 102  CO2 _0 GLUCOSE 152* 145* 149* 153*  BUN 21 24* 25* 25*  CREATININE 1.41* 1.52* 1.53* 1.53*  CALCIUM 9.7 10.0 10.2 10.1   Liver Function Tests: No results for input(s): AST, ALT, ALKPHOS, BILITOT, PROT,  ALBUMIN in the last 168 hours. No results for input(s): LIPASE, AMYLASE in the last 168 hours. No results for input(s): AMMONIA in the last 168 hours. CBC: Recent Labs  Lab 02/08/18 0029  WBC 7.0  HGB 12.2*  HCT 39.7  MCV 93.0  PLT 230   Cardiac Enzymes: Recent Labs  Lab 02/08/18 0029 02/08/18 0204 02/08/18 0712 02/08/18 1415  TROPONINI 0.12* 0.11* 0.13* 0.10*   BNP: Invalid input(s): POCBNP CBG: Recent Labs  Lab 02/10/18 0610 02/10/18 1100 02/10/18 1337 02/10/18 1718 02/10/18 2117  GLUCAP 165* 171* 227* 136* 136*   D-Dimer No results for input(s): DDIMER in the last 72 hours. Hgb A1c No results for input(s): HGBA1C in the last 72 hours. Lipid Profile No results for input(s): CHOL, HDL, LDLCALC, TRIG, CHOLHDL, LDLDIRECT in the last 72 hours. Thyroid function studies No results for input(s): TSH, T4TOTAL, T3FREE, THYROIDAB in the last 72 hours.  Invalid input(s): FREET3 Anemia work up No results for input(s): VITAMINB12, FOLATE, FERRITIN, TIBC, IRON, RETICCTPCT in the last 72 hours. Urinalysis    Component Value Date/Time   COLORURINE YELLOW 10/22/2017 1403   APPEARANCEUR CLEAR 10/22/2017 1403   LABSPEC 1.009 10/22/2017 1403   PHURINE 5.0 10/22/2017 1403   GLUCOSEU NEGATIVE 10/22/2017 1403   HGBUR NEGATIVE 10/22/2017 1403   BILIRUBINUR NEGATIVE 10/22/2017 1403   KETONESUR NEGATIVE 10/22/2017 1403   PROTEINUR 30 (A) 10/22/2017 1403   UROBILINOGEN 1.0 05/17/2014 1925   NITRITE NEGATIVE 10/22/2017 1403   LEUKOCYTESUR NEGATIVE 10/22/2017 1403   Sepsis Labs Invalid input(s): PROCALCITONIN,  WBC,  LACTICIDVEN Microbiology No results found for this or any previous visit (from the past 240 hour(s)).   Time coordinating discharge: 45 minutes  SIGNED:   Tawni Millers, MD  [facility] 02/11/2018, 9:07 AM Pager [phone]  If 7PM-7AM, please contact night-coverage [email or web address] Password [email or web address]

## 2018-02-11 NOTE — Plan of Care (Signed)

## 2018-02-11 NOTE — Progress Notes (Signed)
Discharge instructions reviewed with patient and meds reviewed. He denies questions and states he has a glucometer and walker at home. He agrees to all follow up instructions and is discharged via wheelchair to sister's care in vehicle waiting at main entrance. He is in stable condition and has all belongings.

## 2018-02-11 NOTE — Care Management Note (Signed)
Case Management Note Marvetta Gibbons RN, BSN Transitions of Care Unit 4E- RN Case Manager [phone]  Patient Details  Name: [name] [name] MRN: [id number] Date of Birth: 1955/11/24  Subjective/Objective:    Pt admitted with acute on chronic HF                Action/Plan: PTA pt lived at home, orders placed for HHRN/PT/OT- CM spoke with pt at bedside for [name] [place] agnecy, list provided Per CMS website with star ratings (copy placed in shadow chart)- per pt he has used [facility] in past and would like to use them again- call made to Butch Penny with [facility] for [facility] referral- referral has been accepted. Pt reports that he has a RW at home, no other DME needs noted. Pt states he has transportation home. Address, phone # and PCP all confirmed with pt per epic.   Expected Discharge Date:  02/11/18               Expected Discharge Plan:  [place]  In-House Referral:  [facility]  Discharge planning Services  CM Consult  Post Acute Care [name]:  Home Health [name] offered to:  Patient  DME Arranged:    DME Agency:     HH Arranged:  RN, Disease Management, PT, OT [place] Agency:  [place]  Status of Service:  Completed, signed off  If discussed at [place] of Stay Meetings, dates discussed:    Discharge Disposition: home/home health  Additional Comments:  [name], RN 02/11/2018, 11:28 AM

## 2018-02-12 LAB — GLUCOSE, CAPILLARY: Glucose-Capillary: 134 mg/dL — ABNORMAL HIGH (ref 70–99)

## 2018-02-13 ENCOUNTER — Encounter (HOSPITAL_COMMUNITY): Payer: Self-pay | Admitting: Emergency Medicine

## 2018-02-13 ENCOUNTER — Emergency Department (HOSPITAL_COMMUNITY): Payer: Medicare Other

## 2018-02-13 ENCOUNTER — Inpatient Hospital Stay (HOSPITAL_COMMUNITY)
Admission: EM | Admit: 2018-02-13 | Discharge: 2018-02-19 | DRG: 291 | Disposition: A | Discharge status: Home Health | Payer: Medicare Other | Attending: Internal Medicine | Admitting: Internal Medicine

## 2018-02-13 DIAGNOSIS — K219 Gastro-esophageal reflux disease without esophagitis: Secondary | ICD-10-CM | POA: Diagnosis present

## 2018-02-13 DIAGNOSIS — Z8546 Personal history of malignant neoplasm of prostate: Secondary | ICD-10-CM

## 2018-02-13 DIAGNOSIS — I5020 Unspecified systolic (congestive) heart failure: Secondary | ICD-10-CM | POA: Diagnosis present

## 2018-02-13 DIAGNOSIS — R0602 Shortness of breath: Secondary | ICD-10-CM | POA: Diagnosis not present

## 2018-02-13 DIAGNOSIS — I480 Paroxysmal atrial fibrillation: Secondary | ICD-10-CM | POA: Diagnosis present

## 2018-02-13 DIAGNOSIS — Z79899 Other long term (current) drug therapy: Secondary | ICD-10-CM

## 2018-02-13 DIAGNOSIS — Z961 Presence of intraocular lens: Secondary | ICD-10-CM | POA: Diagnosis present

## 2018-02-13 DIAGNOSIS — L97929 Non-pressure chronic ulcer of unspecified part of left lower leg with unspecified severity: Secondary | ICD-10-CM | POA: Diagnosis present

## 2018-02-13 DIAGNOSIS — Z888 Allergy status to other drugs, medicaments and biological substances status: Secondary | ICD-10-CM

## 2018-02-13 DIAGNOSIS — I878 Other specified disorders of veins: Secondary | ICD-10-CM | POA: Diagnosis present

## 2018-02-13 DIAGNOSIS — Z8701 Personal history of pneumonia (recurrent): Secondary | ICD-10-CM

## 2018-02-13 DIAGNOSIS — E781 Pure hyperglyceridemia: Secondary | ICD-10-CM | POA: Diagnosis present

## 2018-02-13 DIAGNOSIS — L97909 Non-pressure chronic ulcer of unspecified part of unspecified lower leg with unspecified severity: Secondary | ICD-10-CM

## 2018-02-13 DIAGNOSIS — I5043 Acute on chronic combined systolic (congestive) and diastolic (congestive) heart failure: Secondary | ICD-10-CM | POA: Diagnosis present

## 2018-02-13 DIAGNOSIS — I493 Ventricular premature depolarization: Secondary | ICD-10-CM | POA: Diagnosis present

## 2018-02-13 DIAGNOSIS — R778 Other specified abnormalities of plasma proteins: Secondary | ICD-10-CM | POA: Diagnosis present

## 2018-02-13 DIAGNOSIS — R7989 Other specified abnormal findings of blood chemistry: Secondary | ICD-10-CM | POA: Diagnosis present

## 2018-02-13 DIAGNOSIS — N182 Chronic kidney disease, stage 2 (mild): Secondary | ICD-10-CM | POA: Diagnosis present

## 2018-02-13 DIAGNOSIS — I509 Heart failure, unspecified: Secondary | ICD-10-CM

## 2018-02-13 DIAGNOSIS — N183 Chronic kidney disease, stage 3 (moderate): Secondary | ICD-10-CM | POA: Diagnosis present

## 2018-02-13 DIAGNOSIS — F209 Schizophrenia, unspecified: Secondary | ICD-10-CM | POA: Diagnosis present

## 2018-02-13 DIAGNOSIS — I5023 Acute on chronic systolic (congestive) heart failure: Secondary | ICD-10-CM | POA: Diagnosis not present

## 2018-02-13 DIAGNOSIS — Z9841 Cataract extraction status, right eye: Secondary | ICD-10-CM

## 2018-02-13 DIAGNOSIS — E785 Hyperlipidemia, unspecified: Secondary | ICD-10-CM | POA: Diagnosis present

## 2018-02-13 DIAGNOSIS — I130 Hypertensive heart and chronic kidney disease with heart failure and stage 1 through stage 4 chronic kidney disease, or unspecified chronic kidney disease: Secondary | ICD-10-CM | POA: Diagnosis not present

## 2018-02-13 DIAGNOSIS — E119 Type 2 diabetes mellitus without complications: Secondary | ICD-10-CM

## 2018-02-13 DIAGNOSIS — I428 Other cardiomyopathies: Secondary | ICD-10-CM | POA: Diagnosis present

## 2018-02-13 DIAGNOSIS — Z6838 Body mass index (BMI) 38.0-38.9, adult: Secondary | ICD-10-CM

## 2018-02-13 DIAGNOSIS — I472 Ventricular tachycardia: Secondary | ICD-10-CM | POA: Diagnosis present

## 2018-02-13 DIAGNOSIS — E1165 Type 2 diabetes mellitus with hyperglycemia: Secondary | ICD-10-CM | POA: Diagnosis present

## 2018-02-13 DIAGNOSIS — N179 Acute kidney failure, unspecified: Secondary | ICD-10-CM | POA: Diagnosis present

## 2018-02-13 DIAGNOSIS — Z8673 Personal history of transient ischemic attack (TIA), and cerebral infarction without residual deficits: Secondary | ICD-10-CM

## 2018-02-13 DIAGNOSIS — Z87891 Personal history of nicotine dependence: Secondary | ICD-10-CM

## 2018-02-13 DIAGNOSIS — I872 Venous insufficiency (chronic) (peripheral): Secondary | ICD-10-CM | POA: Diagnosis present

## 2018-02-13 DIAGNOSIS — I471 Supraventricular tachycardia: Secondary | ICD-10-CM | POA: Diagnosis present

## 2018-02-13 DIAGNOSIS — F1020 Alcohol dependence, uncomplicated: Secondary | ICD-10-CM | POA: Diagnosis present

## 2018-02-13 DIAGNOSIS — E1122 Type 2 diabetes mellitus with diabetic chronic kidney disease: Secondary | ICD-10-CM | POA: Diagnosis present

## 2018-02-13 DIAGNOSIS — Z794 Long term (current) use of insulin: Secondary | ICD-10-CM

## 2018-02-13 DIAGNOSIS — E11622 Type 2 diabetes mellitus with other skin ulcer: Secondary | ICD-10-CM | POA: Diagnosis present

## 2018-02-13 DIAGNOSIS — Z9114 Patient's other noncompliance with medication regimen: Secondary | ICD-10-CM

## 2018-02-13 DIAGNOSIS — Z8249 Family history of ischemic heart disease and other diseases of the circulatory system: Secondary | ICD-10-CM

## 2018-02-13 DIAGNOSIS — K760 Fatty (change of) liver, not elsewhere classified: Secondary | ICD-10-CM | POA: Diagnosis present

## 2018-02-13 DIAGNOSIS — E1151 Type 2 diabetes mellitus with diabetic peripheral angiopathy without gangrene: Secondary | ICD-10-CM | POA: Diagnosis present

## 2018-02-13 DIAGNOSIS — J9621 Acute and chronic respiratory failure with hypoxia: Secondary | ICD-10-CM | POA: Diagnosis present

## 2018-02-13 DIAGNOSIS — Z7901 Long term (current) use of anticoagulants: Secondary | ICD-10-CM

## 2018-02-13 LAB — COMPREHENSIVE METABOLIC PANEL
ALK PHOS: 101 U/L (ref 38–126)
ALT: 45 U/L — ABNORMAL HIGH (ref 0–44)
AST: 41 U/L (ref 15–41)
Albumin: 3.7 g/dL (ref 3.5–5.0)
Anion gap: 9 (ref 5–15)
BILIRUBIN TOTAL: 1 mg/dL (ref 0.3–1.2)
BUN: 28 mg/dL — ABNORMAL HIGH (ref 8–23)
CO2: 26 mmol/L (ref 22–32)
Calcium: 9.8 mg/dL (ref 8.9–10.3)
Chloride: 104 mmol/L (ref 98–111)
Creatinine, Ser: 1.55 mg/dL — ABNORMAL HIGH (ref 0.61–1.24)
GFR calc Af Amer: 54 mL/min — ABNORMAL LOW (ref >60)
GFR calc non Af Amer: 47 mL/min — ABNORMAL LOW (ref >60)
Glucose, Bld: 116 mg/dL — ABNORMAL HIGH (ref 70–99)
Potassium: 3.9 mmol/L (ref 3.5–5.1)
Sodium: 139 mmol/L (ref 135–145)
TOTAL PROTEIN: 7.7 g/dL (ref 6.5–8.1)

## 2018-02-13 LAB — I-STAT TROPONIN, ED: Troponin i, poc: 0.15 ng/mL (ref 0.00–0.08)

## 2018-02-13 LAB — CBC WITH DIFFERENTIAL/PLATELET
Abs Immature Granulocytes: 0.02 K/uL (ref 0.00–0.07)
Basophils Absolute: 0 K/uL (ref 0.0–0.1)
Basophils Relative: 1 %
Eosinophils Absolute: 0.1 K/uL (ref 0.0–0.5)
Eosinophils Relative: 2 %
HCT: 39.6 % (ref 39.0–52.0)
Hemoglobin: 12.2 g/dL — ABNORMAL LOW (ref 13.0–17.0)
Immature Granulocytes: 0 %
Lymphocytes Relative: 22 %
Lymphs Abs: 1.4 K/uL (ref 0.7–4.0)
MCH: 28.7 pg (ref 26.0–34.0)
MCHC: 30.8 g/dL (ref 30.0–36.0)
MCV: 93.2 fL (ref 80.0–100.0)
Monocytes Absolute: 0.6 K/uL (ref 0.1–1.0)
Monocytes Relative: 10 %
Neutro Abs: 4.1 K/uL (ref 1.7–7.7)
Neutrophils Relative %: 65 %
Platelets: 247 K/uL (ref 150–400)
RBC: 4.25 MIL/uL (ref 4.22–5.81)
RDW: 15.1 % (ref 11.5–15.5)
WBC: 6.2 K/uL (ref 4.0–10.5)
nRBC: 0 % (ref 0.0–0.2)

## 2018-02-13 LAB — BRAIN NATRIURETIC PEPTIDE: B Natriuretic Peptide: 423.4 pg/mL — ABNORMAL HIGH (ref 0.0–100.0)

## 2018-02-13 LAB — BLOOD GAS, ARTERIAL
Acid-Base Excess: 0.8 mmol/L (ref 0.0–2.0)
Bicarbonate: 25 mmol/L (ref 20.0–28.0)
Drawn by: 365271
FIO2: 21
O2 Saturation: 97.3 %
Patient temperature: 98.6
pCO2 arterial: 40.3 mmHg (ref 32.0–48.0)
pH, Arterial: 7.409 (ref 7.350–7.450)
pO2, Arterial: 132 mmHg — ABNORMAL HIGH (ref 83.0–108.0)

## 2018-02-13 LAB — GLUCOSE, CAPILLARY
Glucose-Capillary: 95 mg/dL (ref 70–99)
Glucose-Capillary: 159 mg/dL — ABNORMAL HIGH (ref 70–99)

## 2018-02-13 LAB — HEMOGLOBIN A1C
Hgb A1c MFr Bld: 8.2 % — ABNORMAL HIGH (ref 4.8–5.6)
Mean Plasma Glucose: 188.64 mg/dL

## 2018-02-13 MED ORDER — TORSEMIDE 20 MG PO TABS: 80.0000 mg | ORAL_TABLET | Freq: Every day | ORAL | Status: DC

## 2018-02-13 MED ORDER — ISOSORB DINITRATE-HYDRALAZINE 20-37.5 MG PO TABS
1.0000 | ORAL_TABLET | Freq: Three times a day (TID) | ORAL | Status: DC
  Administered 2018-02-13 – 2018-02-14 (×3): 1 via ORAL
  Filled 2018-02-13 (×3): qty 1

## 2018-02-13 MED ORDER — ALLOPURINOL 100 MG PO TABS
100.0000 mg | ORAL_TABLET | Freq: Every day | ORAL | Status: DC
  Administered 2018-02-14 – 2018-02-19 (×6): 100 mg via ORAL
  Filled 2018-02-13 (×6): qty 1

## 2018-02-13 MED ORDER — FUROSEMIDE 10 MG/ML IJ SOLN
40.0000 mg | Freq: Two times a day (BID) | INTRAMUSCULAR | Status: DC
  Administered 2018-02-13 – 2018-02-14 (×2): 40 mg via INTRAVENOUS
  Filled 2018-02-13 (×2): qty 4

## 2018-02-13 MED ORDER — DIGOXIN 125 MCG PO TABS
0.1250 mg | ORAL_TABLET | Freq: Every day | ORAL | Status: DC
  Administered 2018-02-14 – 2018-02-19 (×6): 0.125 mg via ORAL
  Filled 2018-02-13 (×7): qty 1

## 2018-02-13 MED ORDER — FUROSEMIDE 10 MG/ML IJ SOLN
80.0000 mg | Freq: Once | INTRAMUSCULAR | Status: AC
  Administered 2018-02-13: 80 mg via INTRAVENOUS
  Filled 2018-02-13: qty 8

## 2018-02-13 MED ORDER — RIVAROXABAN 20 MG PO TABS
20.0000 mg | ORAL_TABLET | Freq: Every day | ORAL | Status: DC
  Administered 2018-02-13 – 2018-02-18 (×6): 20 mg via ORAL
  Filled 2018-02-13 (×6): qty 1

## 2018-02-13 MED ORDER — SODIUM CHLORIDE 0.9 % IV SOLN: INTRAVENOUS | Status: DC

## 2018-02-13 MED ORDER — SACUBITRIL-VALSARTAN 24-26 MG PO TABS
1.0000 | ORAL_TABLET | Freq: Two times a day (BID) | ORAL | Status: DC
  Administered 2018-02-14 – 2018-02-19 (×11): 1 via ORAL
  Filled 2018-02-13 (×14): qty 1

## 2018-02-13 MED ORDER — ROSUVASTATIN CALCIUM 20 MG PO TABS
40.0000 mg | ORAL_TABLET | Freq: Every day | ORAL | Status: DC
  Administered 2018-02-14 – 2018-02-19 (×6): 40 mg via ORAL
  Filled 2018-02-13 (×7): qty 2

## 2018-02-13 MED ORDER — CARVEDILOL 3.125 MG PO TABS
3.1250 mg | ORAL_TABLET | Freq: Two times a day (BID) | ORAL | Status: DC
  Administered 2018-02-13 – 2018-02-14 (×2): 3.125 mg via ORAL
  Filled 2018-02-13 (×2): qty 1

## 2018-02-13 MED ORDER — SEMAGLUTIDE(0.25 OR 0.5MG/DOS) 2 MG/1.5ML ~~LOC~~ SOPN: 0.2500 mg | PEN_INJECTOR | SUBCUTANEOUS | Status: DC

## 2018-02-13 MED ORDER — QUETIAPINE FUMARATE 400 MG PO TABS
800.0000 mg | ORAL_TABLET | Freq: Every day | ORAL | Status: DC
  Administered 2018-02-13 – 2018-02-18 (×6): 800 mg via ORAL
  Filled 2018-02-13 (×7): qty 2

## 2018-02-13 MED ORDER — INSULIN GLARGINE 100 UNIT/ML ~~LOC~~ SOLN
20.0000 [IU] | Freq: Every day | SUBCUTANEOUS | Status: DC
  Administered 2018-02-14 – 2018-02-18 (×5): 20 [IU] via SUBCUTANEOUS
  Filled 2018-02-13 (×7): qty 0.2

## 2018-02-13 MED ORDER — ZOLPIDEM TARTRATE 5 MG PO TABS
5.0000 mg | ORAL_TABLET | Freq: Once | ORAL | Status: AC
  Administered 2018-02-13: 5 mg via ORAL
  Filled 2018-02-13: qty 1

## 2018-02-13 MED ORDER — INSULIN ASPART 100 UNIT/ML ~~LOC~~ SOLN
15.0000 [IU] | Freq: Three times a day (TID) | SUBCUTANEOUS | Status: DC
  Administered 2018-02-14 – 2018-02-17 (×10): 15 [IU] via SUBCUTANEOUS

## 2018-02-13 NOTE — Progress Notes (Signed)
MD, pt was told last admission by his MD  which was very recent, to stop his Entresto due to low BP, will continue to monitor, THanks [place].

## 2018-02-13 NOTE — Progress Notes (Signed)
Alert male oriented to unit after recent hospital discharge within past 48 hours. Pt states he was having trouble breathing. States he does not use 02 at home. Currently on 02 at 3/L [place]. Pt sitting on side of bed. No SOB noted at this time. 02 sat 94%.  No c/o discomfort.

## 2018-02-13 NOTE — Consult Note (Signed)
[place] Nurse wound consult note Reason for Consult: Full thickness wound on Left anterior LE with hypergranulation Wound type:full thickness, venous insufficiency Pressure Injury POA: NA Measurement: 2.2cm x 1.2cm with 0.2cm elevation (hypergranulation tissue) Wound bed:red, moist and friable Drainage (amount, consistency, odor) serous Periwound: intact, resolving edema Dressing procedure/placement/frequency: I will implement a silver hydrofiber dressing (Aquacel Ag+) to assist with the absorption of exudate and resolution of the hypergranulation tissue.  This will be changed daily and topped with a 2-layer dry boot (mild compression therapy). To accommodate his needing to elevate his LEs and this causing increase pressure on the buttocks and ITs, I will provide the patient with a pressure redistribution chair pad that he may use while in house and also take home with  him upon discharge.  [place] nursing team will not follow, but will remain available to this patient, the nursing and medical teams.  Please re-consult if needed. Thanks, Maudie Flakes, MSN, RN, [place], Arther Abbott  Pager# [phone]

## 2018-02-13 NOTE — H&P (Signed)
History and Physical  [name] SUMLER VVO:[id number] DOB: 1955/02/17 DOA: 02/13/2018  Referring physician:  Lacretia Leigh, MD      PCP: [name], MD  Outpatient Specialists: None Patient coming from: Home & is able to ambulate   Chief Complaint: Shortness of breath  HPI: [name] [name] is a 63 y.o. male with medical history significant for congestive heart failure chronic stasis dermatitis, diabetes mellitus morbid obesity, atrial fibrillation on anticoagulation with Xarelto, who presents with acute onset of shortness of breath that started this morning which is worsened when he lies down and better when he sits up associated with nonproductive cough without fever or chills admits to increased pedal edema stated that he is compliant with his CHF medications, he was just discharged from the hospital 2 days ago for CHF he called EMS because of worsening shortness of breath.  He denies any chest pain or pressure no abdominal pain no nausea vomiting.  ED Course: He was given Lasix 80 mg for elevated BNP and clinical symptomatic heart failure  Review of Systems: Patient seen and examined. Pt complains of soft breath lower extremity edema also in the left shin  Pt denies any abdominal pain nausea vomiting diarrhea.  Review of systems are otherwise negative   Past Medical History:  Diagnosis Date  . Atrial fibrillation ([place])   . Cancer [facility])    Prostate cancer-bx. 3 weeks ago  . Cataract   . Chronic combined systolic and diastolic CHF (congestive heart failure) (HCC)    a) EF 40-45% per 2D echo (02/2012) with grade 1 DD b)  NICM c) RHC (04/2012): RA: 4, RV 45/3/4, PA 42/9 (24), PCWP 14, Fick CO/CI: 5.2 /2.2, PVR 1.9 WU, PA 62% and 64% d) ECHO (10/2012) EF 40-45%, grade II DD, RV nl  . CKD (chronic kidney disease) stage 2, GFR 60-89 ml/min    BL SCr approximately 1-1.3  . Colitis 05/2009   History of colitis of ascending colon noted on CT abd/pelvis (05/2009), with  interval resolution with subsequent CT  . Continuous chronic alcoholism ([place])   . Degenerative lumbar spinal stenosis    s/p L2-3, L3-4, L4-5 laminectomy partial facetectomy, and bilateral foraminotomy  . Diabetes mellitus without complication ([place])    Type II  . Dysrhythmia    A. Fib  . Family history of adverse reaction to anesthesia    "sister, can't go to sleep"  . GERD (gastroesophageal reflux disease)   . H/O cocaine abuse ([place])   . Hepatic steatosis    suspected 2/2 alcohol abuse  . Hepatitis C   . History of pancreatitis 01/2011   Admission for acute pancreatitis presumed 2/2 ongoing alcohol abuse- and hypertriglyceridemia  . History of pneumonia   . HIT (heparin-induced thrombocytopenia) ([place])   . Hyperlipidemia   . Hypertension    uncontrolled with medication noncompliance  . Hypertriglyceridemia   . NICM (nonischemic cardiomyopathy) ([place])    a. LHC (04/2012): nl arteries  . PFO (patent foramen ovale) 01/2012   with right to left shunt, noted per TEE in evaluation for source of embolic stroke in 02/6946  . Rhabdomyolysis 02/22/2012   H/O rhabdomyolysis in 01/2012 that was idiopathic, cause never identified  . Schizophrenia, schizo-affective ([place])   . Shortness of breath dyspnea   . Splenic cyst   . Stroke ([place]) 01/2012   Small cerebellar infarcts right greater than left as well as questionable acute left external capsule and caudate nuclear punctate lacunar infarcts noted per MRI (01/2012) -  presumed to be embolic likely source PFO with right to left shunt (noted per TEE 01/ 2014)   Past Surgical History:  Procedure Laterality Date  . ACHILLES TENDON REPAIR Right 2007   "it was torn"  . BACK SURGERY    . CARDIAC CATHETERIZATION N/A 09/02/2015   Procedure: Right Heart Cath;  Surgeon: Jolaine Artist, MD;  Location: [place] [facility];  Service: Cardiovascular;  Laterality: N/A;  . CATARACT EXTRACTION W/ INTRAOCULAR LENS IMPLANT Right   . ESOPHAGOGASTRODUODENOSCOPY N/A  06/25/2012   Procedure: ESOPHAGOGASTRODUODENOSCOPY (EGD);  Surgeon: Milus Banister, MD;  Location: [place];  Service: Endoscopy;  Laterality: N/A;  . I&D EXTREMITY  03/20/2011   Procedure: IRRIGATION AND DEBRIDEMENT EXTREMITY;  Surgeon: Kerin Salen, MD;  Location: [place];  Service: Orthopedics;  Laterality: Left;  I&D LEFT ACHILLIES TENDON  . KNEE ARTHROSCOPY Left   . LUMBAR LAMINECTOMY     L2-3, L3-4, L4-5 laminectomy, partial facetectomy  . RESECTION DISTAL CLAVICAL  09/17/2011   Procedure: RESECTION DISTAL CLAVICAL;  Surgeon: Nita Sells, MD;  Location: [place];  Service: Orthopedics;  Laterality: Right;  right shoulder arthroscopy with sad and open distal clavicle excision   . ROBOT ASSISTED LAPAROSCOPIC RADICAL PROSTATECTOMY N/A 12/16/2012   Procedure: ROBOTIC ASSISTED LAPAROSCOPIC PROSTATECTOMY ;  Surgeon: Ardis Hughs, MD;  Location: [facility];  Service: Urology;  Laterality: N/A;  . TONSILLECTOMY      Social History:  reports that he quit smoking about 16 years ago. His smoking use included cigarettes. He has a 30.00 pack-year smoking history. He has never used smokeless tobacco. He reports current alcohol use of about 6.0 standard drinks of alcohol per week. He reports current drug use. Drugs: "Crack" cocaine and Cocaine.   Allergies  Allergen Reactions  . Heparin Other (See Comments)    Documented HIT under problem list Pt states he's not allergic. " They told me not take it anymore"  . Spironolactone Other (See Comments)    Breast tenderness  . Thorazine [Chlorpromazine Hcl] Other (See Comments)    Body freezes up    Family History  Problem Relation Age of Onset  . CAD Mother 39       deceased  . CAD Sister   . CAD Brother 45       died from MI at age 31yo  . Hypertension Other      Prior to Admission medications   Medication Sig Start Date End Date Taking? Authorizing Provider  allopurinol (ZYLOPRIM) 100 MG tablet Take 1 tablet  (100 mg total) by mouth daily. 09/14/17  Yes Regalado, Belkys A, MD  carvedilol (COREG) 3.125 MG tablet Take 1 tablet (3.125 mg total) by mouth 2 (two) times daily with a meal. 06/03/17  Yes Tillery, Satira Mccallum, PA-C  ENTRESTO 24-26 MG Take 1 tablet by mouth 2 (two) times daily. 01/31/18  Yes [provider]  insulin glargine (LANTUS) 100 UNIT/ML injection Inject 0.2 mLs (20 Units total) into the skin at bedtime for 30 days. 02/11/18 03/13/18 Yes Arrien, Jimmy Picket, MD  insulin lispro (HUMALOG) 100 UNIT/ML injection Inject 15 Units into the skin 3 (three) times daily before meals.   Yes [provider]  isosorbide-hydrALAZINE (BIDIL) 20-37.5 MG tablet Take 1 tablet by mouth 3 (three) times daily for 30 days. 02/11/18 03/13/18 Yes Arrien, Jimmy Picket, MD  OZEMPIC, 0.25 OR 0.5 MG/DOSE, 2 MG/1.5ML SOPN Inject 0.25 mg into the skin once a week. 01/31/18  Yes [provider]  QUEtiapine (SEROQUEL) 400 MG tablet Take 1 tablet (400 mg total) by mouth 2 (two) times daily for 21 days. Patient taking differently: Take 800 mg by mouth at bedtime.  06/03/17 02/13/18 Yes Elwin Mocha, MD  rivaroxaban (XARELTO) 20 MG TABS tablet Take 1 tablet (20 mg total) by mouth daily. 06/04/17  Yes Shirley Friar, PA-C  rosuvastatin (CRESTOR) 40 MG tablet Take 1 tablet (40 mg total) by mouth daily. 07/07/17  Yes Georgiana Shore, NP  torsemide (DEMADEX) 20 MG tablet Take 4 tablets (80 mg total) by mouth daily. 09/13/17  Yes Regalado, Belkys A, MD  ACCU-CHEK FASTCLIX LANCETS MISC Check blood sugar 4 times a day before meals and bedtime 02/16/17   [name], MD  B-D UF III MINI PEN NEEDLES 31G X 5 MM MISC Use four times daily as directed. Dx code E11.00, Z79.4 12/23/16   Aldine Contes, MD  Blood Glucose Monitoring Suppl (ACCU-CHEK GUIDE) w/Device KIT 1 each by Does not apply route 4 (four) times daily. 02/16/17   [name], MD  digoxin (LANOXIN) 0.125 MG tablet Take 1 tablet (0.125  mg total) by mouth daily. Patient not taking: Reported on 02/13/2018 09/14/17   Niel Hummer A, MD  glucose blood (ACCU-CHEK GUIDE) test strip Check blood sugar 4 times a day before meal and bedtime 02/16/17   [name], MD  metFORMIN (GLUCOPHAGE-XR) 500 MG 24 hr tablet Take 500 mg by mouth daily. 01/31/18   [provider]    Physical Exam: BP (!) 129/94   Pulse 92   Temp 98 F (36.7 C) (Oral)   Resp 18   Ht 6' (1.829 m)   Wt 128.4 kg   SpO2 100%   BMI 38.38 kg/m   Exam:  . General: 63 y.o. year-old male well developed well nourished in no acute distress.  Alert and oriented x3. . Cardiovascular: Tachycardia  with no rubs or gallops.  No thyromegaly or JVD noted.   Marland Kitchen Respiratory: Has wheezes and rales. Good inspiratory effort. . Abdomen: Soft nontender nondistended with normal bowel sounds x4 quadrants. . Musculoskeletal: 3+ lower extremity edema. 2/4 pulses in all 4 extremities. . Skin: 3 x 2 cm left shin ulcer, hyperpigmentation with chronic stasis dermatitis changes, . Psychiatry: Mood is appropriate for condition and setting           Labs on Admission:  Basic Metabolic Panel: Recent Labs  Lab 02/08/18 0029 02/09/18 0333 02/10/18 0435 02/11/18 0241 02/13/18 1007  NA 134* 137 138 139 139  K 3.6 4.1 3.7 4.0 3.9  CL 101 100 101 102 104  CO2 '23 26 29 30 26  '$ GLUCOSE 152* 145* 149* 153* 116*  BUN 21 24* 25* 25* 28*  CREATININE 1.41* 1.52* 1.53* 1.53* 1.55*  CALCIUM 9.7 10.0 10.2 10.1 9.8   Liver Function Tests: Recent Labs  Lab 02/13/18 1007  AST 41  ALT 45*  ALKPHOS 101  BILITOT 1.0  PROT 7.7  ALBUMIN 3.7   No results for input(s): LIPASE, AMYLASE in the last 168 hours. No results for input(s): AMMONIA in the last 168 hours. CBC: Recent Labs  Lab 02/08/18 0029 02/13/18 1007  WBC 7.0 6.2  NEUTROABS  --  4.1  HGB 12.2* 12.2*  HCT 39.7 39.6  MCV 93.0 93.2  PLT 230 247   Cardiac Enzymes: Recent Labs  Lab 02/08/18 0029  02/08/18 0204 02/08/18 0712 02/08/18 1415  TROPONINI 0.12* 0.11* 0.13* 0.10*    BNP (last 3  results) Recent Labs    02/03/18 0859 02/08/18 0029 02/13/18 1007  BNP 153.2* 243.1* 423.4*    ProBNP (last 3 results) No results for input(s): PROBNP in the last 8760 hours.  CBG: Recent Labs  Lab 02/10/18 1337 02/10/18 1555 02/10/18 1718 02/10/18 2117 02/11/18 0626  GLUCAP 227* 151* 136* 136* 134*    Radiological Exams on Admission: Dg Chest Port 1 View  Result Date: 02/13/2018 CLINICAL DATA:  Shortness of breath.  Dizziness. EXAM: PORTABLE CHEST 1 VIEW COMPARISON:  February 08, 2018 FINDINGS: Stable cardiomegaly. The mediastinum is unchanged since September 2019 consistent with a tortuous thoracic aorta. No pneumothorax. No nodules or masses. No focal infiltrates. No overt edema. IMPRESSION: Limited study due to the low volume portable technique. However, no acute abnormalities are seen. Electronically Signed   By: Dorise Bullion III M.D   On: 02/13/2018 10:17    EKG: Independently reviewed.  Sinus rhythm with ventricular premature complexes  Assessment/Plan Present on Admission: . CKD (chronic kidney disease) stage 2, GFR 60-89 ml/min . AF (paroxysmal atrial fibrillation) ([place]) . Stasis dermatitis of both legs . HLD (hyperlipidemia) . Heart failure with reduced ejection fraction, NYHA class III ([place]) . SOB (shortness of breath) . Acute on chronic respiratory failure with hypoxia ([place]) . Acute on chronic systolic CHF (congestive heart failure) ([place]) . CKD (chronic kidney disease), stage II . Elevated troponin . Diabetic ulcer of lower leg (HCC)  Principal Problem:   Acute on chronic systolic CHF (congestive heart failure) (HCC) Active Problems:   HLD (hyperlipidemia)   CKD (chronic kidney disease) stage 2, GFR 60-89 ml/min   Heart failure with reduced ejection fraction, NYHA class III (HCC)   AF (paroxysmal atrial fibrillation) (HCC)   Type 2 diabetes mellitus  (HCC)   SOB (shortness of breath)   Elevated troponin   CKD (chronic kidney disease), stage II   Stasis dermatitis of both legs   Acute exacerbation of CHF (congestive heart failure) (HCC)   Acute on chronic respiratory failure with hypoxia ([place])   Diabetic ulcer of lower leg ([place])   1.  CHF exacerbation with hypoxia patient is needing O2 at 2 L/min he is usually not on oxygen patient will be started on Lasix IV  2.  Acute kidney injury with increase of his creatinine to 1.55.  Gentle diuresis balancing the CHF with need for diuresis and possibly worsening his renal function will monitor   3.  Left shin shallow ulcer, most likely diabetic related last dressing change was February 10, 2018 20 wound care order has been placed  4.  Elevated troponin which is chronic  5.  Type 2 diabetes mellitus.  Sliding scale started we will monitor his blood sugars    Severity of Illness: The appropriate patient status for this patient is OBSERVATION. Observation status is judged to be reasonable and necessary in order to provide the required intensity of service to ensure the patient's safety. The patient's presenting symptoms, physical exam findings, and initial radiographic and laboratory data in the context of their medical condition is felt to place them at decreased risk for further clinical deterioration. Furthermore, it is anticipated that the patient will be medically stable for discharge from the hospital within 2 midnights of admission. The following factors support the patient status of observation.   " The patient's presenting symptoms include shortness of breath edema. " The physical exam findings include tenderness of breath edema. " The initial radiographic and laboratory data are CHF elevated BNP.  DVT prophylaxis: Lovenox  Code Status: Full  Family Communication: None at bedside  Disposition Plan: Home when stable  Consults called: None  Admission status:  Observation    Cristal Deer MD [facility] Pager [phone]  If 7PM-7AM, please contact night-coverage [email or web address] Password [facility]  02/13/2018, 2:31 PM

## 2018-02-13 NOTE — ED Triage Notes (Signed)
Patient arrived from home reporting shortness of breath, dizziness, denies chest pain. He states "I just cant breath."

## 2018-02-13 NOTE — ED Notes (Signed)
Patient is placed in a recliner-chair as he is only comfortable sitting

## 2018-02-13 NOTE — ED Provider Notes (Addendum)
[name] Provider Note   CSN: [id number] Arrival date & time: 02/13/18  3235     History   Chief Complaint No chief complaint on file.   HPI HIEP OLLIS is a 63 y.o. male.  63 year old male with history of CHF presents with acute onset of shortness of breath which began this morning.  Symptoms worse with lying flat better with sitting up.  Has had nonproductive cough without fever or chills.  Denies any chest pain or chest pressure.  No abdominal discomfort.  Some increased pedal edema.  States compliance with his CHF medications.  Was just discharged from hospital 2 days ago for CHF exacerbation.  Called EMS and was transported here     Past Medical History:  Diagnosis Date  . Atrial fibrillation ([place])   . Cancer [facility])    Prostate cancer-bx. 3 weeks ago  . Cataract   . Chronic combined systolic and diastolic CHF (congestive heart failure) (HCC)    a) EF 40-45% per 2D echo (02/2012) with grade 1 DD b)  NICM c) RHC (04/2012): RA: 4, RV 45/3/4, PA 42/9 (24), PCWP 14, Fick CO/CI: 5.2 /2.2, PVR 1.9 WU, PA 62% and 64% d) ECHO (10/2012) EF 40-45%, grade II DD, RV nl  . CKD (chronic kidney disease) stage 2, GFR 60-89 ml/min    BL SCr approximately 1-1.3  . Colitis 05/2009   History of colitis of ascending colon noted on CT abd/pelvis (05/2009), with interval resolution with subsequent CT  . Continuous chronic alcoholism ([place])   . Degenerative lumbar spinal stenosis    s/p L2-3, L3-4, L4-5 laminectomy partial facetectomy, and bilateral foraminotomy  . Diabetes mellitus without complication ([place])    Type II  . Dysrhythmia    A. Fib  . Family history of adverse reaction to anesthesia    "sister, can't go to sleep"  . GERD (gastroesophageal reflux disease)   . H/O cocaine abuse ([name])   . Hepatic steatosis    suspected 2/2 alcohol abuse  . Hepatitis C   . History of pancreatitis 01/2011   Admission for acute pancreatitis presumed 2/2  ongoing alcohol abuse- and hypertriglyceridemia  . History of pneumonia   . HIT (heparin-induced thrombocytopenia) ([place])   . Hyperlipidemia   . Hypertension    uncontrolled with medication noncompliance  . Hypertriglyceridemia   . NICM (nonischemic cardiomyopathy) ([place])    a. LHC (04/2012): nl arteries  . PFO (patent foramen ovale) 01/2012   with right to left shunt, noted per TEE in evaluation for source of embolic stroke in 05/7320  . Rhabdomyolysis 02/22/2012   H/O rhabdomyolysis in 01/2012 that was idiopathic, cause never identified  . Schizophrenia, schizo-affective ([place])   . Shortness of breath dyspnea   . Splenic cyst   . Stroke ([place]) 01/2012   Small cerebellar infarcts right greater than left as well as questionable acute left external capsule and caudate nuclear punctate lacunar infarcts noted per MRI (01/2012) - presumed to be embolic likely source PFO with right to left shunt (noted per TEE 01/ 2014)    Patient Active Problem List   Diagnosis Date Noted  . Heart failure ([place]) 02/09/2018  . Acute on chronic systolic CHF (congestive heart failure) ([place]) 02/08/2018  . Acute on chronic respiratory failure with hypoxia ([place]) 12/28/2017  . Gout 12/28/2017  . 1st degree AV block 12/28/2017  . Physical deconditioning 12/28/2017  . Acute exacerbation of CHF (congestive heart failure) ([place]) 12/27/2017  . Schizophrenia, schizo-affective (  [place])   . Syncope, vasovagal 10/22/2017  . Syncope 10/22/2017  . Stasis dermatitis of both legs 09/07/2017  . Hypotension 05/31/2017  . DKA (diabetic ketoacidoses) ([place]) 05/27/2017  . CKD (chronic kidney disease), stage II 05/27/2017  . Stroke ([place]) 05/27/2017  . Acute encephalopathy   . Chronic combined systolic (congestive) and diastolic (congestive) heart failure ([place]) 05/12/2017  . Elevated troponin 05/12/2017  . Hyperosmolar non-ketotic state in patient with type 2 diabetes mellitus ([place]) 05/12/2017  . Dehydration 05/12/2017  . Nonketotic  hyperglycinemia, type II ([place]) 05/12/2017  . AKI (acute kidney injury) ([place])   . Chest pain 11/27/2016  . SOB (shortness of breath) 11/26/2016  . Type 2 diabetes mellitus ([place])   . Overgrown toenails 09/10/2015  . Heart failure with reduced ejection fraction, NYHA class III ([place]) 06/07/2015  . Hepatitis C antibody test positive 06/07/2015  . AF (paroxysmal atrial fibrillation) ([place]) 06/07/2015  . Schizophrenia ([place]) 03/06/2014  . Alcohol abuse 09/20/2013  . Generalized weakness 08/04/2013  . Cocaine abuse ([place]) 08/01/2013  . Carpal tunnel syndrome 04/11/2013  . Chronic back pain greater than 3 months duration 07/19/2012  . Gastric ulcer with hemorrhage 06/25/2012  . CKD (chronic kidney disease) stage 2, GFR 60-89 ml/min   . CVA (cerebral infarction) 02/29/2012  . Transaminitis 02/21/2012  . HTN (hypertension) 02/20/2012  . HLD (hyperlipidemia) 05/06/2007    Past Surgical History:  Procedure Laterality Date  . ACHILLES TENDON REPAIR Right 2007   "it was torn"  . BACK SURGERY    . CARDIAC CATHETERIZATION N/A 09/02/2015   Procedure: Right Heart Cath;  Surgeon: Jolaine Artist, MD;  Location: [facility];  Service: Cardiovascular;  Laterality: N/A;  . CATARACT EXTRACTION W/ INTRAOCULAR LENS IMPLANT Right   . ESOPHAGOGASTRODUODENOSCOPY N/A 06/25/2012   Procedure: ESOPHAGOGASTRODUODENOSCOPY (EGD);  Surgeon: Milus Banister, MD;  Location: [facility];  Service: Endoscopy;  Laterality: N/A;  . I&D EXTREMITY  03/20/2011   Procedure: IRRIGATION AND DEBRIDEMENT EXTREMITY;  Surgeon: Kerin Salen, MD;  Location: [facility];  Service: Orthopedics;  Laterality: Left;  I&D LEFT ACHILLIES TENDON  . KNEE ARTHROSCOPY Left   . LUMBAR LAMINECTOMY     L2-3, L3-4, L4-5 laminectomy, partial facetectomy  . RESECTION DISTAL CLAVICAL  09/17/2011   Procedure: RESECTION DISTAL CLAVICAL;  Surgeon: Nita Sells, MD;  Location: [facility];  Service: Orthopedics;  Laterality:  Right;  right shoulder arthroscopy with sad and open distal clavicle excision   . ROBOT ASSISTED LAPAROSCOPIC RADICAL PROSTATECTOMY N/A 12/16/2012   Procedure: ROBOTIC ASSISTED LAPAROSCOPIC PROSTATECTOMY ;  Surgeon: Ardis Hughs, MD;  Location: [facility];  Service: Urology;  Laterality: N/A;  . TONSILLECTOMY          Home Medications    Prior to Admission medications   Medication Sig Start Date End Date Taking? Authorizing Provider  ACCU-CHEK FASTCLIX LANCETS MISC Check blood sugar 4 times a day before meals and bedtime 02/16/17   [name], MD  allopurinol (ZYLOPRIM) 100 MG tablet Take 1 tablet (100 mg total) by mouth daily. 09/14/17   Regalado, Belkys A, MD  B-D UF III MINI PEN NEEDLES 31G X 5 MM MISC Use four times daily as directed. Dx code E11.00, Z79.4 12/23/16   Aldine Contes, MD  Blood Glucose Monitoring Suppl (ACCU-CHEK GUIDE) w/Device KIT 1 each by Does not apply route 4 (four) times daily. 02/16/17   [name], MD  carvedilol (COREG) 3.125 MG tablet Take 1 tablet (3.125 mg total)  by mouth 2 (two) times daily with a meal. 06/03/17   Tillery, Satira Mccallum, PA-C  digoxin (LANOXIN) 0.125 MG tablet Take 1 tablet (0.125 mg total) by mouth daily. 09/14/17   Regalado, Belkys A, MD  glucose blood (ACCU-CHEK GUIDE) test strip Check blood sugar 4 times a day before meal and bedtime 02/16/17   [name], MD  Insulin Degludec (TRESIBA FLEXTOUCH) 200 UNIT/ML SOPN Inject 100 Units into the skin every Wednesday.     [provider]  insulin glargine (LANTUS) 100 UNIT/ML injection Inject 0.2 mLs (20 Units total) into the skin at bedtime for 30 days. 02/11/18 03/13/18  Arrien, Jimmy Picket, MD  insulin lispro (HUMALOG) 100 UNIT/ML injection Inject 15 Units into the skin 3 (three) times daily before meals.    [provider]  isosorbide-hydrALAZINE (BIDIL) 20-37.5 MG tablet Take 1 tablet by mouth 3 (three) times daily for 30 days. 02/11/18 03/13/18  Arrien,  Jimmy Picket, MD  metFORMIN (GLUCOPHAGE) 500 MG tablet Take 500 mg by mouth daily with breakfast.     [provider]  QUEtiapine (SEROQUEL) 400 MG tablet Take 1 tablet (400 mg total) by mouth 2 (two) times daily for 21 days. Patient taking differently: Take 800 mg by mouth at bedtime.  06/03/17 02/08/18  Elwin Mocha, MD  rivaroxaban (XARELTO) 20 MG TABS tablet Take 1 tablet (20 mg total) by mouth daily. 06/04/17   Shirley Friar, PA-C  rosuvastatin (CRESTOR) 40 MG tablet Take 1 tablet (40 mg total) by mouth daily. 07/07/17   Georgiana Shore, NP  torsemide (DEMADEX) 20 MG tablet Take 4 tablets (80 mg total) by mouth daily. 09/13/17   Regalado, Cassie Freer, MD    Family History Family History  Problem Relation Age of Onset  . CAD Mother 28       deceased  . CAD Sister   . CAD Brother 74       died from MI at age 67yo  . Hypertension Other     Social History Social History   Tobacco Use  . Smoking status: Former Smoker    Packs/day: 1.00    Years: 30.00    Pack years: 30.00    Types: Cigarettes    Last attempt to quit: 06/24/2001    Years since quitting: 16.6  . Smokeless tobacco: Never Used  Substance Use Topics  . Alcohol use: Yes    Alcohol/week: 6.0 standard drinks    Types: 6 Glasses of wine per week    Comment: occasinally  . Drug use: Yes    Types: "Crack" cocaine, Cocaine     Allergies   Heparin; Spironolactone; and Thorazine [chlorpromazine hcl]   Review of Systems Review of Systems  All other systems reviewed and are negative.    Physical Exam Updated Vital Signs There were no vitals taken for this visit.  Physical Exam Vitals signs and nursing note reviewed.  Constitutional:      General: He is not in acute distress.    Appearance: Normal appearance. He is well-developed. He is not toxic-appearing.  HENT:     Head: Normocephalic and atraumatic.  Eyes:     General: Lids are normal.     Conjunctiva/sclera: Conjunctivae normal.      Pupils: Pupils are equal, round, and reactive to light.  Neck:     Musculoskeletal: Normal range of motion and neck supple.     Thyroid: No thyroid mass.     Trachea: No tracheal deviation.  Cardiovascular:  Rate and Rhythm: Normal rate and regular rhythm.     Heart sounds: Normal heart sounds. No murmur. No gallop.   Pulmonary:     Effort: Tachypnea and respiratory distress present.     Breath sounds: No stridor. Examination of the right-lower field reveals rales. Examination of the left-lower field reveals rales. Rales present. No decreased breath sounds, wheezing or rhonchi.  Abdominal:     General: Bowel sounds are normal. There is no distension.     Palpations: Abdomen is soft.     Tenderness: There is no abdominal tenderness. There is no rebound.  Musculoskeletal: Normal range of motion.        General: No tenderness.  Lymphadenopathy:     Comments: 3+ bilateral lower extremity pitting edema  Skin:    General: Skin is warm and dry.     Findings: No abrasion or rash.  Neurological:     Mental Status: He is alert and oriented to person, place, and time.     GCS: GCS eye subscore is 4. GCS verbal subscore is 5. GCS motor subscore is 6.     Cranial Nerves: No cranial nerve deficit.     Sensory: No sensory deficit.  Psychiatric:        Speech: Speech normal.        Behavior: Behavior normal.      ED Treatments / Results  Labs (all labs ordered are listed, but only abnormal results are displayed) Labs Reviewed  CBC WITH DIFFERENTIAL/PLATELET  COMPREHENSIVE METABOLIC PANEL  BRAIN NATRIURETIC PEPTIDE  DIGOXIN LEVEL  I-STAT TROPONIN, ED    EKG EKG Interpretation  Date/Time:  Sunday February 13 2018 10:02:52 EST Ventricular Rate:  97 PR Interval:    QRS Duration: 117 QT Interval:  379 QTC Calculation: 482 R Axis:   -139 Text Interpretation:  Sinus rhythm Ventricular premature complex Prolonged PR interval Nonspecific intraventricular conduction delay Consider  anterior infarct Baseline wander in lead(s) II III aVF V4 Confirmed by Lacretia Leigh ([id number]) on 02/13/2018 10:34:29 AM   Radiology No results found.  Procedures Procedures (including critical care time)  Medications Ordered in ED Medications  0.9 %  sodium chloride infusion (has no administration in time range)     Initial Impression / Assessment and Plan / ED Course  I have reviewed the triage vital signs and the nursing notes.  Pertinent labs & imaging results that were available during my care of the patient were reviewed by me and considered in my medical decision making (see chart for details).     Patient troponin elevated but this appears to be chronic for him.  Chest x-ray without evidence of severe pulmonary edema but BNP is elevated and patient clinically appears to be having heart failure.  Given 80 mg of IV Lasix and will be admitted to the hospitalist service  CRITICAL CARE Performed by: Leota Jacobsen Total critical care time: 45 minutes Critical care time was exclusive of separately billable procedures and treating other patients. Critical care was necessary to treat or prevent imminent or life-threatening deterioration. Critical care was time spent personally by me on the following activities: development of treatment plan with patient and/or surrogate as well as nursing, discussions with consultants, evaluation of patient's response to treatment, examination of patient, obtaining history from patient or surrogate, ordering and performing treatments and interventions, ordering and review of laboratory studies, ordering and review of radiographic studies, pulse oximetry and re-evaluation of patient's condition.   Final Clinical Impressions(s) / ED Diagnoses  Final diagnoses:  None    ED Discharge Orders    None       Lacretia Leigh, MD 02/13/18 1153    Lacretia Leigh, MD 02/21/18 1416

## 2018-02-14 ENCOUNTER — Other Ambulatory Visit: Payer: Self-pay

## 2018-02-14 ENCOUNTER — Observation Stay (HOSPITAL_COMMUNITY): Payer: Medicare Other

## 2018-02-14 ENCOUNTER — Observation Stay: Payer: Self-pay

## 2018-02-14 ENCOUNTER — Encounter (HOSPITAL_COMMUNITY): Payer: Self-pay | Admitting: General Practice

## 2018-02-14 ENCOUNTER — Inpatient Hospital Stay (HOSPITAL_COMMUNITY): Payer: Self-pay

## 2018-02-14 DIAGNOSIS — K760 Fatty (change of) liver, not elsewhere classified: Secondary | ICD-10-CM | POA: Diagnosis present

## 2018-02-14 DIAGNOSIS — F209 Schizophrenia, unspecified: Secondary | ICD-10-CM | POA: Diagnosis present

## 2018-02-14 DIAGNOSIS — E781 Pure hyperglyceridemia: Secondary | ICD-10-CM | POA: Diagnosis present

## 2018-02-14 DIAGNOSIS — I878 Other specified disorders of veins: Secondary | ICD-10-CM | POA: Diagnosis present

## 2018-02-14 DIAGNOSIS — Z6838 Body mass index (BMI) 38.0-38.9, adult: Secondary | ICD-10-CM | POA: Diagnosis not present

## 2018-02-14 DIAGNOSIS — I130 Hypertensive heart and chronic kidney disease with heart failure and stage 1 through stage 4 chronic kidney disease, or unspecified chronic kidney disease: Secondary | ICD-10-CM | POA: Diagnosis present

## 2018-02-14 DIAGNOSIS — R0602 Shortness of breath: Secondary | ICD-10-CM | POA: Diagnosis present

## 2018-02-14 DIAGNOSIS — L0390 Cellulitis, unspecified: Secondary | ICD-10-CM | POA: Diagnosis not present

## 2018-02-14 DIAGNOSIS — I5020 Unspecified systolic (congestive) heart failure: Secondary | ICD-10-CM | POA: Diagnosis not present

## 2018-02-14 DIAGNOSIS — I471 Supraventricular tachycardia: Secondary | ICD-10-CM | POA: Diagnosis present

## 2018-02-14 DIAGNOSIS — I739 Peripheral vascular disease, unspecified: Secondary | ICD-10-CM | POA: Diagnosis not present

## 2018-02-14 DIAGNOSIS — N183 Chronic kidney disease, stage 3 (moderate): Secondary | ICD-10-CM

## 2018-02-14 DIAGNOSIS — E785 Hyperlipidemia, unspecified: Secondary | ICD-10-CM | POA: Diagnosis present

## 2018-02-14 DIAGNOSIS — I472 Ventricular tachycardia: Secondary | ICD-10-CM | POA: Diagnosis present

## 2018-02-14 DIAGNOSIS — J9621 Acute and chronic respiratory failure with hypoxia: Secondary | ICD-10-CM | POA: Diagnosis present

## 2018-02-14 DIAGNOSIS — J9601 Acute respiratory failure with hypoxia: Secondary | ICD-10-CM | POA: Diagnosis not present

## 2018-02-14 DIAGNOSIS — E1165 Type 2 diabetes mellitus with hyperglycemia: Secondary | ICD-10-CM | POA: Diagnosis present

## 2018-02-14 DIAGNOSIS — I5043 Acute on chronic combined systolic (congestive) and diastolic (congestive) heart failure: Secondary | ICD-10-CM | POA: Diagnosis present

## 2018-02-14 DIAGNOSIS — L97929 Non-pressure chronic ulcer of unspecified part of left lower leg with unspecified severity: Secondary | ICD-10-CM | POA: Diagnosis present

## 2018-02-14 DIAGNOSIS — I5023 Acute on chronic systolic (congestive) heart failure: Secondary | ICD-10-CM

## 2018-02-14 DIAGNOSIS — I428 Other cardiomyopathies: Secondary | ICD-10-CM | POA: Diagnosis present

## 2018-02-14 DIAGNOSIS — N179 Acute kidney failure, unspecified: Secondary | ICD-10-CM | POA: Diagnosis present

## 2018-02-14 DIAGNOSIS — F1020 Alcohol dependence, uncomplicated: Secondary | ICD-10-CM | POA: Diagnosis present

## 2018-02-14 DIAGNOSIS — I872 Venous insufficiency (chronic) (peripheral): Secondary | ICD-10-CM | POA: Diagnosis present

## 2018-02-14 DIAGNOSIS — I493 Ventricular premature depolarization: Secondary | ICD-10-CM | POA: Diagnosis present

## 2018-02-14 DIAGNOSIS — I480 Paroxysmal atrial fibrillation: Secondary | ICD-10-CM | POA: Diagnosis present

## 2018-02-14 DIAGNOSIS — E11622 Type 2 diabetes mellitus with other skin ulcer: Secondary | ICD-10-CM | POA: Diagnosis present

## 2018-02-14 DIAGNOSIS — E1151 Type 2 diabetes mellitus with diabetic peripheral angiopathy without gangrene: Secondary | ICD-10-CM | POA: Diagnosis present

## 2018-02-14 DIAGNOSIS — E1122 Type 2 diabetes mellitus with diabetic chronic kidney disease: Secondary | ICD-10-CM | POA: Diagnosis present

## 2018-02-14 LAB — CBC
HCT: 37.1 % — ABNORMAL LOW (ref 39.0–52.0)
Hemoglobin: 11.4 g/dL — ABNORMAL LOW (ref 13.0–17.0)
MCH: 28.6 pg (ref 26.0–34.0)
MCHC: 30.7 g/dL (ref 30.0–36.0)
MCV: 93 fL (ref 80.0–100.0)
Platelets: 221 10*3/uL (ref 150–400)
RBC: 3.99 MIL/uL — ABNORMAL LOW (ref 4.22–5.81)
RDW: 15.3 % (ref 11.5–15.5)
WBC: 6.8 10*3/uL (ref 4.0–10.5)
nRBC: 0 % (ref 0.0–0.2)

## 2018-02-14 LAB — COOXEMETRY PANEL
Carboxyhemoglobin: 1.2 % (ref 0.5–1.5)
Methemoglobin: 1.6 % — ABNORMAL HIGH (ref 0.0–1.5)
O2 Saturation: 39 %
Total hemoglobin: 11.9 g/dL — ABNORMAL LOW (ref 12.0–16.0)

## 2018-02-14 LAB — BLOOD GAS, ARTERIAL
Acid-Base Excess: 1.9 mmol/L (ref 0.0–2.0)
Bicarbonate: 26.4 mmol/L (ref 20.0–28.0)
Drawn by: 275531
FIO2: 0.21
O2 Saturation: 90.5 %
PO2 ART: 64.9 mmHg — AB (ref 83.0–108.0)
Patient temperature: 98.6
pCO2 arterial: 45 mmHg (ref 32.0–48.0)
pH, Arterial: 7.386 (ref 7.350–7.450)

## 2018-02-14 LAB — GLUCOSE, CAPILLARY
GLUCOSE-CAPILLARY: 130 mg/dL — AB (ref 70–99)
Glucose-Capillary: 169 mg/dL — ABNORMAL HIGH (ref 70–99)
Glucose-Capillary: 135 mg/dL — ABNORMAL HIGH (ref 70–99)
Glucose-Capillary: 131 mg/dL — ABNORMAL HIGH (ref 70–99)

## 2018-02-14 LAB — COMPREHENSIVE METABOLIC PANEL
ALT: 38 U/L (ref 0–44)
ANION GAP: 10 (ref 5–15)
AST: 40 U/L (ref 15–41)
Albumin: 3.5 g/dL (ref 3.5–5.0)
Alkaline Phosphatase: 93 U/L (ref 38–126)
BUN: 29 mg/dL — ABNORMAL HIGH (ref 8–23)
CO2: 27 mmol/L (ref 22–32)
Calcium: 9.7 mg/dL (ref 8.9–10.3)
Chloride: 103 mmol/L (ref 98–111)
Creatinine, Ser: 1.63 mg/dL — ABNORMAL HIGH (ref 0.61–1.24)
GFR calc non Af Amer: 44 mL/min — ABNORMAL LOW (ref >60)
GFR, EST AFRICAN AMERICAN: 51 mL/min — AB (ref >60)
Glucose, Bld: 142 mg/dL — ABNORMAL HIGH (ref 70–99)
Potassium: 4.2 mmol/L (ref 3.5–5.1)
Sodium: 140 mmol/L (ref 135–145)
Total Bilirubin: 0.6 mg/dL (ref 0.3–1.2)
Total Protein: 7.1 g/dL (ref 6.5–8.1)

## 2018-02-14 LAB — DIGOXIN LEVEL: Digoxin Level: <0.2 ng/mL — ABNORMAL LOW (ref 0.8–2.0)

## 2018-02-14 LAB — APTT: aPTT: 44 seconds — ABNORMAL HIGH (ref 24–36)

## 2018-02-14 LAB — TROPONIN I: Troponin I: 0.07 ng/mL (ref <0.03)

## 2018-02-14 LAB — BRAIN NATRIURETIC PEPTIDE: B NATRIURETIC PEPTIDE 5: 354.1 pg/mL — AB (ref 0.0–100.0)

## 2018-02-14 LAB — MAGNESIUM: Magnesium: 2.1 mg/dL (ref 1.7–2.4)

## 2018-02-14 LAB — MRSA PCR SCREENING: MRSA by PCR: NEGATIVE

## 2018-02-14 MED ORDER — ACETAMINOPHEN 325 MG PO TABS
650.0000 mg | ORAL_TABLET | Freq: Four times a day (QID) | ORAL | Status: DC | PRN
  Administered 2018-02-14: 650 mg via ORAL
  Filled 2018-02-14: qty 2

## 2018-02-14 MED ORDER — EPLERENONE 25 MG PO TABS
12.5000 mg | ORAL_TABLET | Freq: Every day | ORAL | Status: DC
  Administered 2018-02-14: 12.5 mg via ORAL
  Filled 2018-02-14: qty 1

## 2018-02-14 MED ORDER — MILRINONE LACTATE IN DEXTROSE 20-5 MG/100ML-% IV SOLN
0.1250 ug/kg/min | INTRAVENOUS | Status: DC
  Administered 2018-02-14 – 2018-02-15 (×4): 0.25 ug/kg/min via INTRAVENOUS
  Administered 2018-02-16: 0.125 ug/kg/min via INTRAVENOUS
  Administered 2018-02-16: 0.25 ug/kg/min via INTRAVENOUS
  Administered 2018-02-17: 0.125 ug/kg/min via INTRAVENOUS
  Filled 2018-02-14 (×7): qty 100

## 2018-02-14 MED ORDER — FUROSEMIDE 10 MG/ML IJ SOLN
80.0000 mg | Freq: Two times a day (BID) | INTRAMUSCULAR | Status: DC
  Administered 2018-02-14 – 2018-02-16 (×5): 80 mg via INTRAVENOUS
  Filled 2018-02-14 (×5): qty 8

## 2018-02-14 MED ORDER — FUROSEMIDE 10 MG/ML IJ SOLN: 60.0000 mg | Freq: Two times a day (BID) | INTRAMUSCULAR | Status: DC

## 2018-02-14 MED ORDER — SODIUM CHLORIDE 0.9% FLUSH
10.0000 mL | Freq: Two times a day (BID) | INTRAVENOUS | Status: DC
  Administered 2018-02-14 – 2018-02-18 (×7): 10 mL
  Administered 2018-02-18: 20 mL

## 2018-02-14 MED ORDER — ISOSORB DINITRATE-HYDRALAZINE 20-37.5 MG PO TABS
0.5000 | ORAL_TABLET | Freq: Three times a day (TID) | ORAL | Status: DC
  Administered 2018-02-14 – 2018-02-19 (×15): 0.5 via ORAL
  Filled 2018-02-14 (×15): qty 1

## 2018-02-14 MED ORDER — SODIUM CHLORIDE 0.9% FLUSH: 10.0000 mL | INTRAVENOUS | Status: DC | PRN

## 2018-02-14 NOTE — Progress Notes (Signed)
CRITICAL VALUE ALERT  Critical Value: troponin 0.07  Date & Time Notied:  02/14/2018 1233  Provider Notified: na  Orders Received/Actions taken:  Troponin trends down from previous value. MD aware of it. Continue to monitor pt closely.

## 2018-02-14 NOTE — Discharge Instructions (Addendum)
Information on my medicine - XARELTO (Rivaroxaban)  Why was Xarelto prescribed for you? Xarelto was prescribed for you to reduce the risk of a blood clot forming that can cause a stroke if you have a medical condition called atrial fibrillation (a type of irregular heartbeat).  What do you need to know about xarelto ? Take your Xarelto ONCE DAILY at the same time every day with your evening meal. If you have difficulty swallowing the tablet whole, you may crush it and mix in applesauce just prior to taking your dose.  Take Xarelto exactly as prescribed by your doctor and DO NOT stop taking Xarelto without talking to the doctor who prescribed the medication.  Stopping without other stroke prevention medication to take the place of Xarelto may increase your risk of developing a clot that causes a stroke.  Refill your prescription before you run out.  After discharge, you should have regular check-up appointments with your healthcare provider that is prescribing your Xarelto.  In the future your dose may need to be changed if your kidney function or weight changes by a significant amount.  What do you do if you miss a dose? If you are taking Xarelto ONCE DAILY and you miss a dose, take it as soon as you remember on the same day then continue your regularly scheduled once daily regimen the next day. Do not take two doses of Xarelto at the same time or on the same day.   Important Safety Information A possible side effect of Xarelto is bleeding. You should call your healthcare provider right away if you experience any of the following: ? Bleeding from an injury or your nose that does not stop. ? Unusual colored urine (red or dark brown) or unusual colored stools (red or black). ? Unusual bruising for unknown reasons. ? A serious fall or if you hit your head (even if there is no bleeding).  Some medicines may interact with Xarelto and might increase your risk of bleeding while on  Xarelto. To help avoid this, consult your healthcare provider or pharmacist prior to using any new prescription or non-prescription medications, including herbals, vitamins, non-steroidal anti-inflammatory drugs (NSAIDs) and supplements.  This website has more information on Xarelto: [email or web address]    Heart Failure  Heart failure means your heart has trouble pumping blood. This makes it hard for your body to work well. Heart failure is usually a long-term (chronic) condition. You must take good care of yourself and follow your treatment plan from your doctor. Follow these instructions at home: Medicines  Take over-the-counter and prescription medicines only as told by your doctor. ? Do not stop taking your medicine unless your doctor told you to do that. ? Do not skip any doses. ? Refill your prescriptions before you run out of medicine. You need your medicines every day. Eating and drinking   Eat heart-healthy foods. Talk with a diet and nutrition specialist (dietitian) to make an eating plan.  Choose foods that: ? Have no trans fat. ? Are low in saturated fat and cholesterol.  Choose healthy foods, like: ? Fresh or frozen fruits and vegetables. ? Fish. ? Low-fat (lean) meats. ? Legumes (like beans, peas, and lentils). ? Fat-free or low-fat dairy products. ? Whole-grain foods. ? High-fiber foods.  Limit salt (sodium) if told by your doctor. Ask your nutrition specialist to recommend heart-healthy seasonings.  Cook in healthy ways instead of frying. Healthy ways of cooking include: ? Roasting. ? Grilling. ? Broiling. ? Baking. ?  Poaching. ? Steaming. ? Stir-frying.  Limit how much fluid you drink, if told by your doctor. Lifestyle  Do not smoke or use chewing tobacco. Do not use nicotine gum or patches before talking to your doctor.  Limit alcohol intake to no more than 1 drink a day for non-pregnant women and 2 drinks a day for men. One drink equals 12 oz of  beer, 5 oz of wine, or 1 oz of hard liquor. ? Tell your doctor if you drink alcohol many times a week. ? Talk with your doctor about whether any alcohol is safe for you. ? You should stop drinking alcohol: ? If your heart has been damaged by alcohol. ? You have very bad heart failure.  Do not use illegal drugs.  Lose weight if told by your doctor.  Do moderate physical activity if told by your doctor. Ask your doctor what activities are safe for you if: ? You are of older age (elderly). ? You have very bad heart failure. Keep track of important information  Weigh yourself every day. ? Weigh yourself every morning after you pee (urinate) and before breakfast. ? Wear the same amount of clothing each time. ? Write down your daily weight. Give your record to your doctor.  Check and write down your blood pressure as told by your doctor.  Check your pulse as told by your doctor. Dealing with heat and cold  If the weather is very hot: ? Avoid activity that takes a lot of energy. ? Use air conditioning or fans, or find a cooler place. ? Avoid caffeine. ? Avoid alcohol. ? Wear clothing that is loose-fitting, lightweight, and light-colored.  If the weather is very cold: ? Avoid activity that takes a lot of energy. ? Layer your clothes. ? Wear mittens or gloves, a hat, and a scarf when you go outside. ? Avoid alcohol. General instructions  Manage other conditions that you have as told by your doctor.  Learn to manage stress. If you need help, ask your doctor.  Plan rest periods for when you get tired.  Get education and support as needed.  Get rehab (rehabilitation) to help you stay independent and to help with everyday tasks.  Stay up to date with shots (immunizations), especially pneumococcal and flu (influenza) shots.  Keep all follow-up visits as told by your doctor. This is important. Contact a doctor if:  You gain weight quickly.  You are more short of breath than  normal.  You cannot do your normal activities.  You tire easily.  You cough more than normal, especially with activity.  You have any or more puffiness (swelling) in areas such as your hands, feet, ankles, or belly (abdomen).  You cannot sleep because it is hard to breathe.  You feel like your heart is beating fast (palpitations).  You get dizzy or light-headed when you stand up. Get help right away if:  You have trouble breathing.  You or someone else notices a change in your awareness. This could be trouble staying awake or trouble concentrating.  You have chest pain or discomfort.  You pass out (faint). Summary  Heart failure means your heart has trouble pumping blood.  Make sure you refill your prescriptions before you run out of medicine. You need your medicines every day.  Keep records of your weight and blood pressure to give to your doctor.  Contact a doctor if you gain weight quickly. This information is not intended to replace advice given to you  by your health care provider. Make sure you discuss any questions you have with your health care provider. Document Released: 10/22/2007 Document Revised: 10/06/2017 Document Reviewed: 02/04/2016 Elsevier Interactive Patient Education  2019 [employer].    Low-Sodium Eating Plan Sodium, which is an element that makes up salt, helps you maintain a healthy balance of fluids in your body. Too much sodium can increase your blood pressure and cause fluid and waste to be held in your body. Your health care provider or dietitian may recommend following this plan if you have high blood pressure (hypertension), kidney disease, liver disease, or heart failure. Eating less sodium can help lower your blood pressure, reduce swelling, and protect your heart, liver, and kidneys. What are tips for following this plan? General guidelines  Most people on this plan should limit their sodium intake to 1,500-2,000 mg (milligrams) of  sodium each day. Reading food labels   The Nutrition Facts label lists the amount of sodium in one serving of the food. If you eat more than one serving, you must multiply the listed amount of sodium by the number of servings.  Choose foods with less than 140 mg of sodium per serving.  Avoid foods with 300 mg of sodium or more per serving. Shopping  Look for lower-sodium products, often labeled as "low-sodium" or "no salt added."  Always check the sodium content even if foods are labeled as "unsalted" or "no salt added".  Buy fresh foods. ? Avoid canned foods and premade or frozen meals. ? Avoid canned, cured, or processed meats  Buy breads that have less than 80 mg of sodium per slice. Cooking  Eat more home-cooked food and less restaurant, buffet, and fast food.  Avoid adding salt when cooking. Use salt-free seasonings or herbs instead of table salt or sea salt. Check with your health care provider or pharmacist before using salt substitutes.  Cook with plant-based oils, such as canola, sunflower, or olive oil. Meal planning  When eating at a restaurant, ask that your food be prepared with less salt or no salt, if possible.  Avoid foods that contain MSG (monosodium glutamate). MSG is sometimes added to [place] food, bouillon, and some canned foods. What foods are recommended? The items listed may not be a complete list. Talk with your dietitian about what dietary choices are best for you. Grains Low-sodium cereals, including oats, puffed wheat and rice, and shredded wheat. Low-sodium crackers. Unsalted rice. Unsalted pasta. Low-sodium bread. Whole-grain breads and whole-grain pasta. Vegetables Fresh or frozen vegetables. "No salt added" canned vegetables. "No salt added" tomato sauce and paste. Low-sodium or reduced-sodium tomato and vegetable juice. Fruits Fresh, frozen, or canned fruit. Fruit juice. Meats and other protein foods Fresh or frozen (no salt added) meat,  poultry, seafood, and fish. Low-sodium canned tuna and salmon. Unsalted nuts. Dried peas, beans, and lentils without added salt. Unsalted canned beans. Eggs. Unsalted nut butters. Dairy Milk. Soy milk. Cheese that is naturally low in sodium, such as ricotta cheese, fresh mozzarella, or Swiss cheese Low-sodium or reduced-sodium cheese. Cream cheese. Yogurt. Fats and oils Unsalted butter. Unsalted margarine with no trans fat. Vegetable oils such as canola or olive oils. Seasonings and other foods Fresh and dried herbs and spices. Salt-free seasonings. Low-sodium mustard and ketchup. Sodium-free salad dressing. Sodium-free light mayonnaise. Fresh or refrigerated horseradish. Lemon juice. Vinegar. Homemade, reduced-sodium, or low-sodium soups. Unsalted popcorn and pretzels. Low-salt or salt-free chips. What foods are not recommended? The items listed may not be a complete list.  Talk with your dietitian about what dietary choices are best for you. Grains Instant hot cereals. Bread stuffing, pancake, and biscuit mixes. Croutons. Seasoned rice or pasta mixes. Noodle soup cups. Boxed or frozen macaroni and cheese. Regular salted crackers. Self-rising flour. Vegetables Sauerkraut, pickled vegetables, and relishes. Olives. [place] fries. Onion rings. Regular canned vegetables (not low-sodium or reduced-sodium). Regular canned tomato sauce and paste (not low-sodium or reduced-sodium). Regular tomato and vegetable juice (not low-sodium or reduced-sodium). Frozen vegetables in sauces. Meats and other protein foods Meat or fish that is salted, canned, smoked, spiced, or pickled. Bacon, ham, sausage, hotdogs, corned beef, chipped beef, packaged lunch meats, salt pork, jerky, pickled herring, anchovies, regular canned tuna, sardines, salted nuts. Dairy Processed cheese and cheese spreads. Cheese curds. Blue cheese. Feta cheese. String cheese. Regular cottage cheese. Buttermilk. Canned milk. Fats and oils Salted  butter. Regular margarine. Ghee. Bacon fat. Seasonings and other foods Onion salt, garlic salt, seasoned salt, table salt, and sea salt. Canned and packaged gravies. Worcestershire sauce. Tartar sauce. Barbecue sauce. Teriyaki sauce. Soy sauce, including reduced-sodium. Steak sauce. Fish sauce. Oyster sauce. Cocktail sauce. Horseradish that you find on the shelf. Regular ketchup and mustard. Meat flavorings and tenderizers. Bouillon cubes. Hot sauce and Tabasco sauce. Premade or packaged marinades. Premade or packaged taco seasonings. Relishes. Regular salad dressings. Salsa. Potato and tortilla chips. Corn chips and puffs. Salted popcorn and pretzels. Canned or dried soups. Pizza. Frozen entrees and pot pies. Summary  Eating less sodium can help lower your blood pressure, reduce swelling, and protect your heart, liver, and kidneys.  Most people on this plan should limit their sodium intake to 1,500-2,000 mg (milligrams) of sodium each day.  Canned, boxed, and frozen foods are high in sodium. Restaurant foods, fast foods, and pizza are also very high in sodium. You also get sodium by adding salt to food.  Try to cook at home, eat more fresh fruits and vegetables, and eat less fast food, canned, processed, or prepared foods. This information is not intended to replace advice given to you by your health care provider. Make sure you discuss any questions you have with your health care provider. Document Released: 07/04/2001 Document Revised: 01/06/2016 Document Reviewed: 01/06/2016 Elsevier Interactive Patient Education  2019 [employer].

## 2018-02-14 NOTE — Progress Notes (Signed)
ABI has been completed.   Preliminary results in CV Proc.   Abram Sander 02/14/2018 4:17 PM

## 2018-02-14 NOTE — Progress Notes (Signed)
Peripherally Inserted Central Catheter/Midline Placement  The IV Nurse has discussed with the patient and/or persons authorized to consent for the patient, the purpose of this procedure and the potential benefits and risks involved with this procedure.  The benefits include less needle sticks, lab draws from the catheter, and the patient may be discharged home with the catheter. Risks include, but not limited to, infection, bleeding, blood clot (thrombus formation), and puncture of an artery; nerve damage and irregular heartbeat and possibility to perform a PICC exchange if needed/ordered by physician.  Alternatives to this procedure were also discussed.  B[facility] PICC patient education guide, fact sheet on infection prevention and patient information card has been provided to patient /or left at bedside.    PICC/Midline Placement Documentation  PICC Double Lumen 02/14/18 PICC Right Brachial 46 cm 0 cm (Active)  Indication for Insertion or Continuance of Line Vasoactive infusions 02/14/2018  3:13 PM  Exposed Catheter (cm) 0 cm 02/14/2018  3:13 PM  Site Assessment Clean;Dry;Intact 02/14/2018  3:13 PM  Lumen #1 Status Infusing;Blood return noted 02/14/2018  3:13 PM  Lumen #2 Status Flushed;Blood return noted 02/14/2018  3:13 PM  Dressing Type Transparent 02/14/2018  3:13 PM  Dressing Status Clean;Dry;Intact;Antimicrobial disc in place 02/14/2018  3:13 PM  Dressing Intervention New dressing 02/14/2018  3:13 PM  Dressing Change Due 02/21/18 02/14/2018  3:13 PM       [name] [name] 02/14/2018, 3:14 PM

## 2018-02-14 NOTE — Progress Notes (Signed)
MD, pt may need Ambien added to help pt with insomnia, Thanks Arvella Nigh RN.

## 2018-02-14 NOTE — Progress Notes (Signed)
Pt had 6 bt run NSVT, no s/s, has Bmet in am, will continue to monitor, THanks Buckner [place].

## 2018-02-14 NOTE — Care Management Note (Signed)
Case Management Note  Patient Details  Name: [name] MRN: [id number] Date of Birth: Jun 29, 1955  Subjective/Objective:   62 yo male presented with SOB. PMH: congestive heart failure chronic stasis dermatitis, diabetes mellitus morbid obesity, atrial fibrillation on anticoagulation with Xarelto; readmit from hospital 2 days ago               Action/Plan: CM met with patient to discuss transitional needs. Patient lived at home and had transitioned home with [facility] on 02/11/18 for HHRN/PT/OT services. Patient in remains in OBS status at this time, but will require resumption of care [facility] orders/F2F if upgraded to INPT. CM team will continue to follow.  Expected Discharge Date:                  Expected Discharge Plan:  [place]  In-House Referral:  NA  Discharge planning Services  CM Consult  Post Acute Care Choice:  Resumption of Svcs/PTA Provider Choice offered to:  NA  DME Arranged:  N/A DME Agency:  NA  HH Arranged:  NA HH Agency:  NA  Status of Service:  In process, will continue to follow  If discussed at Long Length of Stay Meetings, dates discussed:    Additional Comments:  Midge Minium RN, BSN, NCM-BC, ACM-RN [phone] 02/14/2018, 1:27 PM

## 2018-02-14 NOTE — Consult Note (Addendum)
Advanced Heart Failure Team Consult Note   Primary Physician: [name], MD PCP-Cardiologist: Dr Haroldine Laws  Reason for Consultation: A/C systolic HF  HPI:    [name] [name] is seen today for evaluation of A/C systolic HF at the request of Dr Tawanna Solo.   [name] [name] is a 63 y.o. 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 Had history of acute renal failure in setting of rhabdo requiring short term HD.     Please see previous progress note from 08/04/17 for information regarding admissions prior to 07/2017  Admitted 8/13 - 8/19 with A/C CHF. Diuresed with IV lasix and metolazone for a total of 26 lbs down. Restarted on Entresto and Torsemide 80 mg daily for home. Discharge weight 282 lbs.   Admitted 9/27 - 9/29 after fall secondary to cocaine and ETOH binge. MRI showed acute CVA in setting of not taking his Xarelto.   Last seen in HF clinic 12/16/17. He was doing okay at the time. NYHA II-III and volume was stable. No medication changes were made. His weight was 293 lbs.   He was admitted 12/2-12/6/19 with hypoxia and volume overload. Diuresed with IV lasix, then resumed home torsemide. He was instructed to hold eplerenone and entresto at DC until follow up with cardiology due to soft BP. DC weight 277 lbs.  He was admitted again 1/14-1/17/20 with volume overload. Diuresed with IV lasix, then transitioned back to home dose of torsemide 80 mg daily. Eplerenone was stopped at discharge with AKI. He was started on bidil. DC weight 285 lbs.  He presented 02/13/18 with worsening SOB and hypoxia requiring 2L O2 via [place]. He tells me he has been taking all his medications at home. He has been limiting fluid intake and eating mostly vegetables. He is drowsy this morning.  +orthopnea. +PND. +nonproductive cough. No fever or chills. +BLE edema. Taking torsemide every day with good UOP. Appetite is poor. Energy level is good. No ETOH or cocaine use. Taking all medications.   Pertinent admission labs include: Creatinine 1.55, K 3.9, BNP 423 (prior 153-243), WBC 6.2, hemoglobin 12.2, dig level <0.2, troponin 0.15 CXR: no acute abnormalities. Limited study. Looks like CHF on my review.  He was given 80 mg IV lasix x1 + 40 mg IV lasix x2 with modest UOP. Delene Loll was resumed. SBP 1100-110s. Weight is unchanged today at 285 lbs. Creatinine 1.63. He is now on RA. He had 7 beats NSVT on tele.  ECHO 10/27/12 EF 40-45%  ECHO 06/04/15 LVEF 20-25%, decreased diastolic compliance, LAE, RV severely reduced.  ECHO 11/26/15 EF 30%  Echo 12/26/15 EF 30-35% RV ok.  ECHO 05/31/17 EF 25-30% Echo 09/08/17: EF 25-30%  RHC 08/2015 RA = 7 RV = 35/10 PA = 37/15 (25) PCW = 10 Fick cardiac output/index = 4.3/1.7 PVR = 3.5 WU Ao sat = 95% PA sat = 58%, 58% Assessment: 1. Volume status looks good. 2. Cardiac output depressed  2014 LHC  Left main: Normal LAD: Large vessel gives of 2 diagonals. Normal LCX: Very large vessel with 3 OMs. 20-30% plaque in midsection.  RCA: Dominant. Normal LV-gram done in the RAO projection: Ejection fraction = 35-40% global HK Assessment: 1. Minimal CAD  Review of Systems: [y] = yes, '[ ]'$  = no   General: Weight gain Blue.Reese ]; Weight loss '[ ]'$ ; Anorexia Blue.Reese ];  Fatigue '[ ]'$ ; Fever '[ ]'$ ; Chills '[ ]'$ ; Weakness '[ ]'$   Cardiac: Chest pain/pressure '[ ]'$ ; Resting SOB '[ ]'$ ; Exertional SOB Blue.Reese ]; Orthopnea Blue.Reese ]; Pedal Edema Blue.Reese ]; Palpitations '[ ]'$ ; Syncope '[ ]'$ ; Presyncope '[ ]'$ ; Paroxysmal nocturnal dyspnea[ y]  Pulmonary: Cough [ y]; Wheezing'[ ]'$ ; Hemoptysis'[ ]'$ ; Sputum '[ ]'$ ; Snoring Blue.Reese ]  GI: Vomiting'[ ]'$ ; Dysphagia'[ ]'$ ; Melena'[ ]'$ ; Hematochezia '[ ]'$ ; Heartburn'[ ]'$ ; Abdominal pain '[ ]'$ ; Constipation '[ ]'$ ; Diarrhea '[ ]'$ ; BRBPR '[ ]'$   GU: Hematuria'[ ]'$ ; Dysuria '[ ]'$ ; Nocturia'[ ]'$     Vascular: Pain in legs with walking '[ ]'$ ; Pain in feet with lying flat '[ ]'$ ; Non-healing sores '[ ]'$ ; Stroke '[ ]'$ ; TIA '[ ]'$ ; Slurred speech '[ ]'$ ;  Neuro: Headaches'[ ]'$ ; Vertigo'[ ]'$ ; Seizures'[ ]'$ ; Paresthesias'[ ]'$ ;Blurred vision '[ ]'$ ; Diplopia '[ ]'$ ; Vision changes '[ ]'$   Ortho/Skin: Arthritis '[ ]'$ ; Joint pain '[ ]'$ ; Muscle pain '[ ]'$ ; Joint swelling '[ ]'$ ; Back Pain '[ ]'$ ; Rash '[ ]'$   Psych: Depression'[ ]'$ ; Anxiety'[ ]'$   Heme: Bleeding problems '[ ]'$ ; Clotting disorders '[ ]'$ ; Anemia '[ ]'$   Endocrine: Diabetes [ y]; Thyroid dysfunction'[ ]'$   Home Medications Prior to Admission medications   Medication Sig Start Date End Date Taking? Authorizing Provider  allopurinol (ZYLOPRIM) 100 MG tablet Take 1 tablet (100 mg total) by mouth daily. 09/14/17  Yes Regalado, Belkys A, MD  carvedilol (COREG) 3.125 MG tablet Take 1 tablet (3.125 mg total) by mouth 2 (two) times daily with a meal. 06/03/17  Yes Tillery, Satira Mccallum, PA-C  ENTRESTO 24-26 MG Take 1 tablet by mouth 2 (two) times daily. 01/31/18  Yes [provider]  insulin glargine (LANTUS) 100 UNIT/ML injection Inject 0.2 mLs (20 Units total) into the skin at bedtime for 30 days. 02/11/18 03/13/18 Yes Arrien, Jimmy Picket, MD  insulin lispro (HUMALOG) 100 UNIT/ML injection Inject 15 Units into the skin 3 (three) times daily before meals.   Yes [provider]  isosorbide-hydrALAZINE (BIDIL) 20-37.5 MG tablet Take 1 tablet by mouth 3 (three) times daily for 30 days. 02/11/18 03/13/18 Yes Arrien, Jimmy Picket, MD  OZEMPIC, 0.25 OR 0.5 MG/DOSE, 2 MG/1.5ML SOPN Inject 0.25 mg into the skin once a week. 01/31/18  Yes [provider]  QUEtiapine (SEROQUEL) 400 MG tablet Take 1 tablet (400 mg total) by mouth 2 (two) times daily for 21 days. Patient taking differently: Take 800 mg by mouth at bedtime.  06/03/17 02/13/18 Yes Elwin Mocha, MD  rivaroxaban (XARELTO) 20 MG TABS tablet Take 1 tablet (20 mg total) by mouth daily. 06/04/17  Yes Shirley Friar, PA-C   rosuvastatin (CRESTOR) 40 MG tablet Take 1 tablet (40 mg total) by mouth daily. 07/07/17  Yes Georgiana Shore, NP  torsemide (DEMADEX) 20 MG tablet Take 4 tablets (80 mg total) by mouth daily. 09/13/17  Yes Regalado, Belkys A, MD  ACCU-CHEK FASTCLIX LANCETS MISC Check blood sugar 4 times a day before meals and bedtime 02/16/17   [name], MD  B-D UF III MINI PEN NEEDLES 31G X 5 MM MISC Use four times daily as directed. Dx code E11.00, Z79.4 12/23/16   Aldine Contes, MD  Blood Glucose Monitoring Suppl (ACCU-CHEK GUIDE) w/Device KIT 1 each by Does not apply route 4 (four) times daily. 02/16/17   [name], MD  digoxin (LANOXIN) 0.125 MG tablet Take 1 tablet (0.125 mg total) by mouth daily. Patient not taking: Reported on 02/13/2018 09/14/17   Elmarie Shiley, MD  glucose blood (ACCU-CHEK GUIDE) test strip Check blood sugar 4 times a day before meal and bedtime 02/16/17   [name], MD  metFORMIN (GLUCOPHAGE-XR) 500 MG 24 hr tablet Take 500 mg by mouth daily. 01/31/18   [provider]    Past Medical History: Past Medical History:  Diagnosis Date  . Atrial fibrillation ([place])   . Cancer [facility])    Prostate cancer-bx. 3 weeks ago  . Cataract   . Chronic combined systolic and diastolic CHF (congestive heart failure) (HCC)    a) EF 40-45% per 2D echo (02/2012) with grade 1 DD b)  NICM c) RHC (04/2012): RA: 4, RV 45/3/4, PA 42/9 (24), PCWP 14, Fick CO/CI: 5.2 /2.2, PVR 1.9 WU, PA 62% and 64% d) ECHO (10/2012) EF 40-45%, grade II DD, RV nl  . CKD (chronic kidney disease) stage 2, GFR 60-89 ml/min    BL SCr approximately 1-1.3  . Colitis 05/2009   History of colitis of ascending colon noted on CT abd/pelvis (05/2009), with interval resolution with subsequent CT  . Continuous chronic alcoholism ([place])   . Degenerative lumbar spinal stenosis    s/p L2-3, L3-4, L4-5 laminectomy partial facetectomy, and bilateral foraminotomy  . Diabetes mellitus without complication ([place])     Type II  . Dysrhythmia    A. Fib  . Family history of adverse reaction to anesthesia    "sister, can't go to sleep"  . GERD (gastroesophageal reflux disease)   . H/O cocaine abuse ([place])   . Hepatic steatosis    suspected 2/2 alcohol abuse  . Hepatitis C   . History of pancreatitis 01/2011   Admission for acute pancreatitis presumed 2/2 ongoing alcohol abuse- and hypertriglyceridemia  . History of pneumonia   . HIT (heparin-induced thrombocytopenia) ([place])   . Hyperlipidemia   . Hypertension    uncontrolled with medication noncompliance  . Hypertriglyceridemia   . NICM (nonischemic cardiomyopathy) ([place])    a. LHC (04/2012): nl arteries  . PFO (patent foramen ovale) 01/2012   with right to left shunt, noted per TEE in evaluation for source of embolic stroke in 06/5782  . Rhabdomyolysis 02/22/2012   H/O rhabdomyolysis in 01/2012 that was idiopathic, cause never identified  . Schizophrenia, schizo-affective ([place])   . Shortness of breath dyspnea   . Splenic cyst   . Stroke ([place]) 01/2012   Small cerebellar infarcts right greater than left as well as questionable acute left external capsule and caudate nuclear punctate lacunar infarcts noted per MRI (01/2012) - presumed to be embolic likely source PFO with right to left shunt (noted per TEE 01/ 2014)    Past Surgical History: Past Surgical History:  Procedure Laterality Date  . ACHILLES TENDON REPAIR Right 2007   "it was torn"  . BACK SURGERY    . CARDIAC CATHETERIZATION N/A 09/02/2015   Procedure: Right Heart Cath;  Surgeon: Jolaine Artist, MD;  Location: [facility];  Service: Cardiovascular;  Laterality: N/A;  . CATARACT EXTRACTION W/ INTRAOCULAR LENS IMPLANT Right   . ESOPHAGOGASTRODUODENOSCOPY N/A 06/25/2012   Procedure: ESOPHAGOGASTRODUODENOSCOPY (EGD);  Surgeon: Milus Banister, MD;  Location: [facility];  Service: Endoscopy;  Laterality: N/A;  . I&D EXTREMITY  03/20/2011   Procedure: IRRIGATION AND DEBRIDEMENT  EXTREMITY;  Surgeon: Kerin Salen, MD;  Location: [facility];  Service: Orthopedics;  Laterality: Left;  I&D LEFT ACHILLIES TENDON  . KNEE ARTHROSCOPY Left   . LUMBAR LAMINECTOMY     L2-3, L3-4, L4-5 laminectomy, partial facetectomy  .  RESECTION DISTAL CLAVICAL  09/17/2011   Procedure: RESECTION DISTAL CLAVICAL;  Surgeon: Nita Sells, MD;  Location: [facility];  Service: Orthopedics;  Laterality: Right;  right shoulder arthroscopy with sad and open distal clavicle excision   . ROBOT ASSISTED LAPAROSCOPIC RADICAL PROSTATECTOMY N/A 12/16/2012   Procedure: ROBOTIC ASSISTED LAPAROSCOPIC PROSTATECTOMY ;  Surgeon: Ardis Hughs, MD;  Location: [facility];  Service: Urology;  Laterality: N/A;  . TONSILLECTOMY      Family History: Family History  Problem Relation Age of Onset  . CAD Mother 46       deceased  . CAD Sister   . CAD Brother 58       died from MI at age 26yo  . Hypertension Other     Social History: Social History   Socioeconomic History  . Marital status: Single    Spouse name: Not on file  . Number of children: Not on file  . Years of education: 12th grade  . Highest education level: Not on file  Occupational History  . Occupation: Disability    Comment: 2/2 schizophrenia  Social Needs  . Financial resource strain: Not on file  . Food insecurity:    Worry: Not on file    Inability: Not on file  . Transportation needs:    Medical: Not on file    Non-medical: Not on file  Tobacco Use  . Smoking status: Former Smoker    Packs/day: 1.00    Years: 30.00    Pack years: 30.00    Types: Cigarettes    Last attempt to quit: 06/24/2001    Years since quitting: 16.6  . Smokeless tobacco: Never Used  Substance and Sexual Activity  . Alcohol use: Yes    Alcohol/week: 6.0 standard drinks    Types: 6 Glasses of wine per week    Comment: occasinally  . Drug use: Yes    Types: "Crack" cocaine, Cocaine  . Sexual activity: Yes  Lifestyle  . Physical  activity:    Days per week: Not on file    Minutes per session: Not on file  . Stress: Not on file  Relationships  . Social connections:    Talks on phone: Not on file    Gets together: Not on file    Attends religious service: Not on file    Active member of club or organization: Not on file    Attends meetings of clubs or organizations: Not on file    Relationship status: Not on file  Other Topics Concern  . Not on file  Social History Narrative   Lives in [place] alone, has a [facility] aide that helps with medications 4-5 days a week with medications, helping to clean.    Allergies:  Allergies  Allergen Reactions  . Heparin Other (See Comments)    Documented HIT under problem list Pt states he's not allergic. " They told me not take it anymore"  . Spironolactone Other (See Comments)    Breast tenderness  . Thorazine [Chlorpromazine Hcl] Other (See Comments)    Body freezes up    Objective:    Vital Signs:   Temp:  [97.7 F (36.5 C)-98.7 F (37.1 C)] 97.7 F (36.5 C) (01/20 0300) Pulse Rate:  [52-100] 97 (01/20 0840) Resp:  [13-29] 20 (01/20 0753) BP: (95-129)/(51-96) 113/61 (01/20 0759) SpO2:  [96 %-100 %] 97 % (01/20 0759) Weight:  [129 kg-129.1 kg] 129.1 kg (01/20 0300)    Weight change: [employer]  02/13/18 1002 02/13/18 1516 02/14/18 0300  Weight: 128.4 kg 129 kg 129.1 kg    Intake/Output:   Intake/Output Summary (Last 24 hours) at 02/14/2018 1003 Last data filed at 02/14/2018 0928 Gross per 24 hour  Intake 560 ml  Output 1750 ml  Net -1190 ml      Physical Exam    General:  Drowsy. Sitting in chair.  No resp difficulty HEENT: normal Neck: supple. JVP to jaw. Carotids 2+ bilat; no bruits. No lymphadenopathy or thyromegaly appreciated. Cor: PMI nondisplaced. Regular rate & rhythm. No rubs, gallops or murmurs. Lungs: crackles in bases. Abdomen: soft, nontender, +distended. No hepatosplenomegaly. No bruits or masses. Good bowel  sounds. Extremities: no cyanosis, clubbing, rash, BLE 2+ edema. RLE TED hose. LLE wrapped.  Neuro: alert & orientedx3, cranial nerves grossly intact. moves all 4 extremities w/o difficulty. Affect pleasant   Telemetry   NSR 90s with 1st degree AV block and PACs. 7 beats NSVT. Personally reviewed.   EKG    NSR 97 bpm with 1st degree AV block and PVCs, incomplete LBBB. Looks similar to previous.    Labs   Basic Metabolic Panel: Recent Labs  Lab 02/09/18 0333 02/10/18 0435 02/11/18 0241 02/13/18 1007 02/14/18 0507  NA 137 138 139 139 140  K 4.1 3.7 4.0 3.9 4.2  CL 100 101 102 104 103  CO2 '26 29 30 26 27  '$ GLUCOSE 145* 149* 153* 116* 142*  BUN 24* 25* 25* 28* 29*  CREATININE 1.52* 1.53* 1.53* 1.55* 1.63*  CALCIUM 10.0 10.2 10.1 9.8 9.7    Liver Function Tests: Recent Labs  Lab 02/13/18 1007 02/14/18 0507  AST 41 40  ALT 45* 38  ALKPHOS 101 93  BILITOT 1.0 0.6  PROT 7.7 7.1  ALBUMIN 3.7 3.5   No results for input(s): LIPASE, AMYLASE in the last 168 hours. No results for input(s): AMMONIA in the last 168 hours.  CBC: Recent Labs  Lab 02/08/18 0029 02/13/18 1007 02/14/18 0507  WBC 7.0 6.2 6.8  NEUTROABS  --  4.1  --   HGB 12.2* 12.2* 11.4*  HCT 39.7 39.6 37.1*  MCV 93.0 93.2 93.0  PLT 230 247 221    Cardiac Enzymes: Recent Labs  Lab 02/08/18 0029 02/08/18 0204 02/08/18 0712 02/08/18 1415  TROPONINI 0.12* 0.11* 0.13* 0.10*    BNP: BNP (last 3 results) Recent Labs    02/08/18 0029 02/13/18 1007 02/14/18 0507  BNP 243.1* 423.4* 354.1*    ProBNP (last 3 results) No results for input(s): PROBNP in the last 8760 hours.   CBG: Recent Labs  Lab 02/10/18 2117 02/11/18 0626 02/13/18 1630 02/13/18 2103 02/14/18 0741  GLUCAP 136* 134* 95 159* 130*    Coagulation Studies: No results for input(s): LABPROT, INR in the last 72 hours.   Imaging   Dg Chest Port 1 View  Result Date: 02/13/2018 CLINICAL DATA:  Shortness of breath.   Dizziness. EXAM: PORTABLE CHEST 1 VIEW COMPARISON:  February 08, 2018 FINDINGS: Stable cardiomegaly. The mediastinum is unchanged since September 2019 consistent with a tortuous thoracic aorta. No pneumothorax. No nodules or masses. No focal infiltrates. No overt edema. IMPRESSION: Limited study due to the low volume portable technique. However, no acute abnormalities are seen. Electronically Signed   By: Dorise Bullion III M.D   On: 02/13/2018 10:17      Medications:     Current Medications: . allopurinol  100 mg Oral Daily  . carvedilol  3.125 mg Oral BID WC  .  digoxin  0.125 mg Oral Daily  . furosemide  60 mg Intravenous BID  . insulin aspart  15 Units Subcutaneous TID AC  . insulin glargine  20 Units Subcutaneous QHS  . isosorbide-hydrALAZINE  1 tablet Oral TID  . QUEtiapine  800 mg Oral QHS  . rivaroxaban  20 mg Oral Daily  . rosuvastatin  40 mg Oral Daily  . sacubitril-valsartan  1 tablet Oral BID     Infusions:     Patient Profile   CARLETON VANVALKENBURGH is a 63 y.o. 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 Had history of acute renal failure in setting of rhabdo requiring short term HD.     [facility] to [facility] with SOB. He has had frequent admissions for volume overload.   Assessment/Plan   1) Acute on chronic combined systolic/diastolic HF: EF 91-47%, grade II DD (10/2012) now decreased to 20-25%  (05/2015) RV severely reduced. Echo 4/19 EF 30-35% RV ok. Had [place] 2014 with minimal CAD. Holter showed <3% PVC burden.  - Echo 05/31/17 EF 25-30% - Echo 09/08/17 LVEF 25-30%, Mild LAE.  - Volume elevated on exam. I am concerned he is low output.    - Increase lasix to 80 mg IV BID, 1st dose now. - Restart eplerenone 12.5 mg daily. (breast tenderness with spiro) - Hold BB  with volume overload and ?low output - Continue Entresto 24/26 mg BID. He got first dose this morning at lower dose. He was previously on 49/51, but has been off for some time. Will need to stop if BP drops. SBP 100-110s - Decrease bidil to 1/2 tab TID.  - Continue digoxin 0.125 mg daily. Dig level <0.2 yesterday.  - Has previously refused HF paramedicine.  - Not a candidate for ICD with recurrent wounds. - May need PICC line +/- RHC. Will discuss with MD.  - Repeat echo - He is not a great candidate for advanced therapies due to noncompliance 2) Acute hypoxic respiratory failure - Required O2 on admit. Now on RA 3) PAF:  - Continue Xarelto 20 mg daily. Denies any missed doses - NSR on tele.  4) CKD stage III - Creatinine baseline 1.3-1.5 - Monitor daily BMET. Creatinine 1.63 today. 5) ?OSA - Has been ordered for sleep study multiple times but not attended. Would benefit from home sleep study once available.  6) DMII - Per primary 7) Medication Noncompliance - Refuses HF paramedicine.  8) Hyperlipidemia - Continue crestor 9) CVA - Noted 09/2017 - No deficits.  - Continue Xarelto 10) Hx of ETOH and cocaine abuse - Denies recent use.  11) LLE wound - Chronic venous stasis wounds. WOC consulted and gave recommendations. Per primary.  12) NSVT - K 4.2 today. Add on mag 13) Elevated troponin - Troponin 0.15 on admit, seems to be his baseline. Will trend. Likely secondary to #1. - LHC in 2014 with minimal CAD.   Medication concerns reviewed with patient and pharmacy team. Barriers identified: compliance  Length of Stay: [place], NP  02/14/2018, 10:03 AM  Advanced Heart Failure Team Pager [phone] (M-F; 7a - 4p)  Please contact [facility] for night-coverage after hours (4p -7a ) and weekends on [email or web address]  Patient seen with NP, agree with the above note.  He has been admitted with CHF three times to hospital since  last outpatient appoinment. This time, he got  short of breath at home so came back to the ER.   This morning, he is drowsy but awakens.  Denies dyspnea at rest. He is in NSR.  Denies ETOH, cocaine, or tobacco recently.   On exam, JVP 12-14 cm.  Regular S1S2, no audible murmur.  Lungs with distant breath sounds.    Patient has recurrent volume overload.  He is drowsy, ?due to Seroquel versus low output versus untreated sleep apnea/hypercarbia.  - Agree with Lasix 80 mg IV bid today, may need to diurese more aggressively.  - Careful with BP, can start back Entresto 24/26 bid but would drop Bidil to 1/2 tab tid.   - Continue digoxin and eplerenone.  - Repeat echo.  - Hold off on Coreg until we get some idea of cardiac output.  Will place PICC today, follow CVP/co-ox.  If co-ox low, may need milrinone to assist diuresis while an inpatient.  He would be a poor candidate for home milrinone or advanced therapies.  - Not candidate for ICD with chronic wounds.  - Will get ABG => ?hypercarbia. Needs sleep study but has refused to schedule in past.   Has chronic lower leg wounds, will get ABIs to make sure he does not have significant PAD (suspect venous stasis wounds).    He remains in NSR, continue Xarelto.   Follow creatinine closely, it is stable over the last few weeks but up from prior baseline around 1.0.   Loralie Champagne 02/14/2018 11:14 AM

## 2018-02-14 NOTE — Progress Notes (Signed)
PROGRESS NOTE    [name] [name]  DIY:[id number] DOB: 11-Aug-1955 DOA: 02/13/2018 PCP: [name], MD   Brief Narrative: Patient is a 63 year old male with past medical history of systolic congestive heart failure with ejection fraction of 25 to 30%, diabetes mellitus,morbid obesity, chronic stasis dermatitis, atrial fibrillation on anticoagulation with Xarelto who presented from home with acute onset of shortness of breath.  Also had episodes of orthopnea.  Complaint of nonproductive cough and increased lower extremity edema.  Patient was just discharged from here on 1/17 after being treated for CHF exacerbation. Patient admitted for acute CHF exacerbation again this time.  Started on IV diuresis.  Heart failure consult placed.  Assessment & Plan:   Principal Problem:   Acute on chronic systolic CHF (congestive heart failure) (HCC) Active Problems:   HLD (hyperlipidemia)   CKD (chronic kidney disease) stage 2, GFR 60-89 ml/min   Heart failure with reduced ejection fraction, NYHA class III (HCC)   AF (paroxysmal atrial fibrillation) (HCC)   Type 2 diabetes mellitus (HCC)   SOB (shortness of breath)   Elevated troponin   CKD (chronic kidney disease), stage II   Stasis dermatitis of both legs   Acute exacerbation of CHF (congestive heart failure) (HCC)   Acute on chronic respiratory failure with hypoxia (HCC)   Diabetic ulcer of lower leg (HCC)  Acute  CHF exacerbation: Has history of systolic heart failure.  Ejection fraction of 25 to 30% as per echocardiogram on 8/19.  He follows with cardiology. Was just discharged from here on 1/17 after being treated for the same.  Came with increased shortness of breath, lower extremity edema, orthopnea. Found to be hypoxic on presentation. Started on IV diuresis.  Currently on Lasix 40 mg twice a day, will increase to 60 mg twice a day IV.  He is on torsemide 80 mg daily at home. Had net output of around 1500 since  yesterday.. Continue fluid restriction, monitoring of input and output. Also on carvedilol, digoxin, BiDil, Entresto which we will continue.  AKI on CKD stage III: Getting around 1.5.  Creatinine slightly up today.  We will continue to monitor BMP.  Paroxysmal A. fib: Currently on Xarelto.CHA2DS2-VAScof 6. Currently heart rate is controlled.  Continue Coreg.  Uncontrolled type 2 diabetes: Hemoglobin A1c of 8.2.  Continue sliding scale insulin here.  On metformin and insulin  at home.  Hypertension: Currently blood pressure stable.  Continue carvedilol, BiDil  Hyperlipidemia: Continue Crestor  Schizophrenia: On daily pain.  Currently mood is stable.  Left lower extremity ulcer: Has shallow ulcer on the left leg.  Most likely has chronic venous stasis and exacerbated by lower extremity edema due to CHF.  Continue wound care.          DVT prophylaxis:Xarelto Code Status: Full Family Communication: None present at the bedside Disposition Plan: Home after optimization of CHF status   Consultants: Heart failure  Procedures: None  Antimicrobials:  Anti-infectives (From admission, onward)   None      Subjective:  Patient seen and examined the bedside this morning.  Was sitting in the chair.  Was not dyspneic , not complaining of chest pain.  Feels slightly better than yesterday.   Objective: Vitals:   02/13/18 2329 02/14/18 0300 02/14/18 0753 02/14/18 0759  BP: 103/86 100/79 113/62 113/61  Pulse: 90 95 100 (!) 52  Resp: 18 18 20    Temp: 98 F (36.7 C) 97.7 F (36.5 C)    TempSrc: Oral Oral  SpO2: 96% 100% 96% 97%  Weight:  129.1 kg    Height:        Intake/Output Summary (Last 24 hours) at 02/14/2018 0835 Last data filed at 02/13/2018 2200 Gross per 24 hour  Intake 320 ml  Output 1750 ml  Net -1430 ml   Filed Weights   02/13/18 1002 02/13/18 1516 02/14/18 0300  Weight: 128.4 kg 129 kg 129.1 kg    Examination:  General exam: Not in  distress,obese HEENT:PERRL,Oral mucosa moist, Ear/Nose normal on gross exam Respiratory system: Bilateral decreased air entry on the bases Cardiovascular system: S1 & S2 heard, RRR. No JVD, murmurs, rubs, gallops or clicks. 2 +pedal edema. Gastrointestinal system: Abdomen is nondistended, soft and nontender. No organomegaly or masses felt. Normal bowel sounds heard. Central nervous system: Alert and oriented. No focal neurological deficits. Extremities:2+ lower extremity edema, no clubbing ,no cyanosis, distal peripheral pulses palpable. Skin: Shallow ulcer on the left lower extremity      Data Reviewed: I have personally reviewed following labs and imaging studies  CBC: Recent Labs  Lab 02/08/18 0029 02/13/18 1007 02/14/18 0507  WBC 7.0 6.2 6.8  NEUTROABS  --  4.1  --   HGB 12.2* 12.2* 11.4*  HCT 39.7 39.6 37.1*  MCV 93.0 93.2 93.0  PLT 230 247 270   Basic Metabolic Panel: Recent Labs  Lab 02/09/18 0333 02/10/18 0435 02/11/18 0241 02/13/18 1007 02/14/18 0507  NA 137 138 139 139 140  K 4.1 3.7 4.0 3.9 4.2  CL 100 101 102 104 103  CO2 26 29 30 26 27   GLUCOSE 145* 149* 153* 116* 142*  BUN 24* 25* 25* 28* 29*  CREATININE 1.52* 1.53* 1.53* 1.55* 1.63*  CALCIUM 10.0 10.2 10.1 9.8 9.7   GFR: Estimated Creatinine Clearance: 64.4 mL/min (A) (by C-G formula based on SCr of 1.63 mg/dL (H)). Liver Function Tests: Recent Labs  Lab 02/13/18 1007 02/14/18 0507  AST 41 40  ALT 45* 38  ALKPHOS 101 93  BILITOT 1.0 0.6  PROT 7.7 7.1  ALBUMIN 3.7 3.5   No results for input(s): LIPASE, AMYLASE in the last 168 hours. No results for input(s): AMMONIA in the last 168 hours. Coagulation Profile: No results for input(s): INR, PROTIME in the last 168 hours. Cardiac Enzymes: Recent Labs  Lab 02/08/18 0029 02/08/18 0204 02/08/18 0712 02/08/18 1415  TROPONINI 0.12* 0.11* 0.13* 0.10*   BNP (last 3 results) No results for input(s): PROBNP in the last 8760  hours. HbA1C: Recent Labs    02/13/18 1007  HGBA1C 8.2*   CBG: Recent Labs  Lab 02/10/18 2117 02/11/18 0626 02/13/18 1630 02/13/18 2103 02/14/18 0741  GLUCAP 136* 134* 95 159* 130*   Lipid Profile: No results for input(s): CHOL, HDL, LDLCALC, TRIG, CHOLHDL, LDLDIRECT in the last 72 hours. Thyroid Function Tests: No results for input(s): TSH, T4TOTAL, FREET4, T3FREE, THYROIDAB in the last 72 hours. Anemia Panel: No results for input(s): VITAMINB12, FOLATE, FERRITIN, TIBC, IRON, RETICCTPCT in the last 72 hours. Sepsis Labs: No results for input(s): PROCALCITON, LATICACIDVEN in the last 168 hours.  No results found for this or any previous visit (from the past 240 hour(s)).       Radiology Studies: Dg Chest Port 1 View  Result Date: 02/13/2018 CLINICAL DATA:  Shortness of breath.  Dizziness. EXAM: PORTABLE CHEST 1 VIEW COMPARISON:  February 08, 2018 FINDINGS: Stable cardiomegaly. The mediastinum is unchanged since September 2019 consistent with a tortuous thoracic aorta. No pneumothorax. No nodules or masses.  No focal infiltrates. No overt edema. IMPRESSION: Limited study due to the low volume portable technique. However, no acute abnormalities are seen. Electronically Signed   By: Dorise Bullion III M.D   On: 02/13/2018 10:17        Scheduled Meds: . allopurinol  100 mg Oral Daily  . carvedilol  3.125 mg Oral BID WC  . digoxin  0.125 mg Oral Daily  . furosemide  40 mg Intravenous BID  . insulin aspart  15 Units Subcutaneous TID AC  . insulin glargine  20 Units Subcutaneous QHS  . isosorbide-hydrALAZINE  1 tablet Oral TID  . QUEtiapine  800 mg Oral QHS  . rivaroxaban  20 mg Oral Daily  . rosuvastatin  40 mg Oral Daily  . sacubitril-valsartan  1 tablet Oral BID   Continuous Infusions:   LOS: 0 days    Time spent:35 mins. More than 50% of that time was spent in counseling and/or coordination of care.      Shelly Coss, MD [facility] Pager  [phone]  If 7PM-7AM, please contact night-coverage [email or web address] Password [facility] 02/14/2018, 8:35 AM

## 2018-02-14 NOTE — Care Management Obs Status (Signed)
[facility] NOTIFICATION   Patient Details  Name: SKY BORBOA MRN: [id number] Date of Birth: [birth year]   Medicare Observation Status Notification Given:  Yes    Midge Minium RN, BSN, NCM-BC, ACM-RN [phone] 02/14/2018, 1:25 PM

## 2018-02-14 NOTE — Plan of Care (Signed)
  Problem: Education: Goal: Knowledge of General Education information will improve Description: Including pain rating scale, medication(s)/side effects and non-pharmacologic comfort measures Outcome: Progressing   Problem: Education: Goal: Ability to demonstrate management of disease process will improve Outcome: Progressing   Problem: Activity: Goal: Capacity to carry out activities will improve Outcome: Progressing   

## 2018-02-15 ENCOUNTER — Inpatient Hospital Stay (HOSPITAL_COMMUNITY): Payer: Medicare Other

## 2018-02-15 DIAGNOSIS — R0602 Shortness of breath: Secondary | ICD-10-CM

## 2018-02-15 LAB — COOXEMETRY PANEL
Carboxyhemoglobin: 1.7 % — ABNORMAL HIGH (ref 0.5–1.5)
Methemoglobin: 1.6 % — ABNORMAL HIGH (ref 0.0–1.5)
O2 Saturation: 58.4 %
Total hemoglobin: 12.1 g/dL (ref 12.0–16.0)

## 2018-02-15 LAB — BASIC METABOLIC PANEL
Anion gap: 7 (ref 5–15)
BUN: 22 mg/dL (ref 8–23)
CO2: 27 mmol/L (ref 22–32)
Calcium: 9.7 mg/dL (ref 8.9–10.3)
Chloride: 107 mmol/L (ref 98–111)
Creatinine, Ser: 1.32 mg/dL — ABNORMAL HIGH (ref 0.61–1.24)
GFR calc Af Amer: >60 mL/min (ref >60)
GFR calc non Af Amer: 57 mL/min — ABNORMAL LOW (ref >60)
Glucose, Bld: 107 mg/dL — ABNORMAL HIGH (ref 70–99)
Potassium: 3.6 mmol/L (ref 3.5–5.1)
Sodium: 141 mmol/L (ref 135–145)

## 2018-02-15 LAB — ECHOCARDIOGRAM COMPLETE
Height: 72 in
Weight: 4470.4 oz

## 2018-02-15 LAB — GLUCOSE, CAPILLARY
GLUCOSE-CAPILLARY: 112 mg/dL — AB (ref 70–99)
Glucose-Capillary: 82 mg/dL (ref 70–99)
Glucose-Capillary: 176 mg/dL — ABNORMAL HIGH (ref 70–99)
Glucose-Capillary: 137 mg/dL — ABNORMAL HIGH (ref 70–99)
Glucose-Capillary: 109 mg/dL — ABNORMAL HIGH (ref 70–99)

## 2018-02-15 LAB — MAGNESIUM: Magnesium: 2.3 mg/dL (ref 1.7–2.4)

## 2018-02-15 MED ORDER — POTASSIUM CHLORIDE CRYS ER 20 MEQ PO TBCR
40.0000 meq | EXTENDED_RELEASE_TABLET | Freq: Every day | ORAL | Status: DC
  Administered 2018-02-16 – 2018-02-19 (×4): 40 meq via ORAL
  Filled 2018-02-15 (×4): qty 2

## 2018-02-15 MED ORDER — EPLERENONE 25 MG PO TABS
25.0000 mg | ORAL_TABLET | Freq: Every day | ORAL | Status: DC
  Administered 2018-02-15 – 2018-02-18 (×4): 25 mg via ORAL
  Filled 2018-02-15 (×5): qty 1

## 2018-02-15 MED ORDER — POTASSIUM CHLORIDE CRYS ER 20 MEQ PO TBCR
40.0000 meq | EXTENDED_RELEASE_TABLET | Freq: Once | ORAL | Status: AC
  Administered 2018-02-15: 40 meq via ORAL
  Filled 2018-02-15: qty 2

## 2018-02-15 MED ORDER — POTASSIUM CHLORIDE CRYS ER 20 MEQ PO TBCR: 40.0000 meq | EXTENDED_RELEASE_TABLET | Freq: Every day | ORAL | Status: DC

## 2018-02-15 MED ORDER — PERFLUTREN LIPID MICROSPHERE
1.0000 mL | INTRAVENOUS | Status: AC | PRN
  Administered 2018-02-15: 5 mL via INTRAVENOUS
  Filled 2018-02-15: qty 10

## 2018-02-15 NOTE — Procedures (Signed)
Echo attempted. Patient in chair. Will attempt later.

## 2018-02-15 NOTE — Progress Notes (Addendum)
Advanced Heart Failure Rounding Note  PCP-Cardiologist: Dr Haroldine Laws  Subjective:    PICC line placed and coox came back at 39% yesterday. Started on milrinone 0.25 mcg/kg/min. Coox 58% this morning. Creatinine improved 1.63 -> 1.32. CVP 15.  ABG yesterday with CO2 45. BLE ABIs: normal, no evidence of PAD. Echo not yet done.  Entresto and eplerenone resumed yesterday. Bidil decreased for BP room. SBP 100-120.  Good diuresis with 80 mg IV lasix BID. I/O -2.8 L. Weight down 5 lbs overnight. Frequent PACs on tele -> EKG pending.   He says SOB is much better. Still + cough and + orthopnea.   Objective:   Weight Range: 126.7 kg Body mass index is 37.89 kg/m.   Vital Signs:   Temp:  [97.5 F (36.4 C)-98 F (36.7 C)] 97.5 F (36.4 C) (01/21 0517) Pulse Rate:  [66-98] 66 (01/21 0517) Resp:  [17-22] 22 (01/20 2330) BP: (97-152)/(67-135) 120/75 (01/21 0517) SpO2:  [91 %-100 %] 93 % (01/21 0517) Weight:  [126.7 kg] 126.7 kg (01/21 0517) Last BM Date: 02/14/18  Weight change: Filed Weights   02/13/18 1516 02/14/18 0300 02/15/18 0517  Weight: 129 kg 129.1 kg 126.7 kg    Intake/Output:   Intake/Output Summary (Last 24 hours) at 02/15/2018 0806 Last data filed at 02/15/2018 0715 Gross per 24 hour  Intake 940.59 ml  Output 4000 ml  Net -3059.41 ml      Physical Exam    General:  Drowsy. No resp difficulty. Sitting in chair HEENT: Normal Neck: Supple. JVP 15. Carotids 2+ bilat; no bruits. No lymphadenopathy or thyromegaly appreciated. Cor: PMI nondisplaced. Regular rate & rhythm. No rubs, gallops or murmurs. Lungs: crackles in bases Abdomen: Soft, nontender, +distended. No hepatosplenomegaly. No bruits or masses. Good bowel sounds. Extremities: No cyanosis, clubbing, rash, 2+ edema to knees. BLE TED hose on.  Neuro: Alert & orientedx3, cranial nerves grossly intact. moves all 4 extremities w/o difficulty. Affect pleasant   Telemetry   NSR 90-100s with 1st degree AV  block with frequent PACs. P-waves look different at times. EKG Pending. Personally reviewed.   EKG    Pending.  Labs    CBC Recent Labs    02/13/18 1007 02/14/18 0507  WBC 6.2 6.8  NEUTROABS 4.1  --   HGB 12.2* 11.4*  HCT 39.6 37.1*  MCV 93.2 93.0  PLT 247 765   Basic Metabolic Panel Recent Labs    02/14/18 0507 02/14/18 1126 02/15/18 0429  NA 140  --  141  K 4.2  --  3.6  CL 103  --  107  CO2 27  --  27  GLUCOSE 142*  --  107*  BUN 29*  --  22  CREATININE 1.63*  --  1.32*  CALCIUM 9.7  --  9.7  MG  --  2.1  --    Liver Function Tests Recent Labs    02/13/18 1007 02/14/18 0507  AST 41 40  ALT 45* 38  ALKPHOS 101 93  BILITOT 1.0 0.6  PROT 7.7 7.1  ALBUMIN 3.7 3.5   No results for input(s): LIPASE, AMYLASE in the last 72 hours. Cardiac Enzymes Recent Labs    02/14/18 1126  TROPONINI 0.07*    BNP: BNP (last 3 results) Recent Labs    02/08/18 0029 02/13/18 1007 02/14/18 0507  BNP 243.1* 423.4* 354.1*    ProBNP (last 3 results) No results for input(s): PROBNP in the last 8760 hours.   D-Dimer No results for  input(s): DDIMER in the last 72 hours. Hemoglobin A1C Recent Labs    02/13/18 1007  HGBA1C 8.2*   Fasting Lipid Panel No results for input(s): CHOL, HDL, LDLCALC, TRIG, CHOLHDL, LDLDIRECT in the last 72 hours. Thyroid Function Tests No results for input(s): TSH, T4TOTAL, T3FREE, THYROIDAB in the last 72 hours.  Invalid input(s): FREET3  Other results:   Imaging    Vas [place] Abi With/wo Tbi  Result Date: 02/14/2018 LOWER EXTREMITY DOPPLER STUDY Indications: Ulceration, and peripheral artery disease.  Performing Technologist: Abram Sander RVS  Examination Guidelines: A complete evaluation includes at minimum, Doppler waveform signals and systolic blood pressure reading at the level of bilateral brachial, anterior tibial, and posterior tibial arteries, when vessel segments are accessible. Bilateral testing is considered an integral  part of a complete examination. Photoelectric Plethysmograph (PPG) waveforms and toe systolic pressure readings are included as required and additional duplex testing as needed. Limited examinations for reoccurring indications may be performed as noted.  ABI Findings: +--------+------------------+-----+---------+----------------------------------+ Right   Rt Pressure (mmHg)IndexWaveform Comment                            +--------+------------------+-----+---------+----------------------------------+ Brachial                       triphasicunable to do pressure due to IV                                            location                           +--------+------------------+-----+---------+----------------------------------+ PTA     133               1.07 triphasic                                   +--------+------------------+-----+---------+----------------------------------+ DP      135               1.09 triphasic                                   +--------+------------------+-----+---------+----------------------------------+ +--------+------------------+-----+---------+-------+ Left    Lt Pressure (mmHg)IndexWaveform Comment +--------+------------------+-----+---------+-------+ [id number]                    triphasic        +--------+------------------+-----+---------+-------+ PTA     143               1.15 triphasic        +--------+------------------+-----+---------+-------+ DP      154               1.24 triphasic        +--------+------------------+-----+---------+-------+ +-------+-----------+-----------+------------+------------+ ABI/TBIToday's ABIToday's TBIPrevious ABIPrevious TBI +-------+-----------+-----------+------------+------------+ Right  1.09                                           +-------+-----------+-----------+------------+------------+ Left   1.24                                            +-------+-----------+-----------+------------+------------+  Summary: Right: Resting right ankle-brachial index is within normal range. No evidence of significant right lower extremity arterial disease. Left: Resting left ankle-brachial index is within normal range. No evidence of significant left lower extremity arterial disease.  *See table(s) above for measurements and observations.  Electronically signed by Ruta Hinds MD on 02/14/2018 at 5:28:30 PM.   Final    [place] Ekg Site Rite  Result Date: 02/14/2018 If Site Rite image not attached, placement could not be confirmed due to current cardiac rhythm.     Medications:     Scheduled Medications: . allopurinol  100 mg Oral Daily  . digoxin  0.125 mg Oral Daily  . eplerenone  12.5 mg Oral QHS  . furosemide  80 mg Intravenous BID  . insulin aspart  15 Units Subcutaneous TID AC  . insulin glargine  20 Units Subcutaneous QHS  . isosorbide-hydrALAZINE  0.5 tablet Oral TID  . QUEtiapine  800 mg Oral QHS  . rivaroxaban  20 mg Oral Daily  . rosuvastatin  40 mg Oral Daily  . sacubitril-valsartan  1 tablet Oral BID  . sodium chloride flush  10-40 mL Intracatheter Q12H     Infusions: . milrinone 0.25 mcg/kg/min (02/15/18 0424)     PRN Medications:  acetaminophen, sodium chloride flush    Patient Profile   [name] [name] is a 63 y.o. 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 Had history of acute renal failure in setting of rhabdo requiring short term HD.   Presented to [facility] on [facility]/13[facility]18 with SOB. He has had frequent admissions for volume overload.   Assessment/Plan   1) Acute on chronic combined systolic/diastolic HF: EF 54-56%, grade II DD (10/2012) now decreased to 20-25% (05/2015) RV severely reduced. Echo 4/19 EF 30-35% RV  ok. Had [place] 2014 with minimal CAD. Holter showed <3% PVC burden.  - Echo 05/31/17 EF 25-30% - Echo 09/08/17 LVEF 25-30%, Mild LAE. - Volume remains elevated. Good response with IV lasix and milrinone yesterday. CVP still 15. Coox 39% -> 58% on milrinone 0.25 mcg/kg/min. Continue to help with diuresis. He is not a candidate for home inotropes.  - Continue lasix 80 mg IV BID - Increase eplerenone to 25 mg daily. (breast tenderness with spiro) - Hold BB with low output -Continue Entresto 24/26 mg BID.  - Continue bidil to 1/2 tab TID.  - Continue digoxin 0.125 mg daily. Dig level <0.2 1/19. - Has previously refused HF paramedicine.  - Not a candidate for ICD with recurrent wounds. - Repeat echo ordered, not yet done.  - He is not a great candidate for advanced therapies due to noncompliance 2) Acute hypoxic respiratory failure - Wearing 2 L O2 this morning. Does not wear at home.  3) PAF:  - Continue Xarelto 20 mg daily.Denies any missed doses - NSR on tele. Frequent PACs. See below. EKG pending.  4) CKD stage III -Creatinine baseline 1.3-1.5 - Monitor daily BMET. Creatinine 1.63 -> 1.32 with addition of milrinone.  5) ?OSA -Has been ordered for sleep study multiple times but not attended. Would benefit from home sleep study once available.No change. 6) DMII -Per primary. No change.  7) Medication Noncompliance - Refuses HF paramedicine.No change.  8) Hyperlipidemia - Continuecrestor. No change.  9) CVA - Noted 09/2017 -No deficits. - Continue Xarelto. No change.  10) Hx of ETOH  and cocaine abuse - Denies recent use. No change.  11) LLE wound - Chronic venous stasis wounds. WOC consulted and gave recommendations. Per primary.  - ABIs 02/14/18 normal.  12) NSVT - K 3.6 today. Mag 2.1 yesterday. Add on today. No further NSVT. 13) Elevated troponin - Troponin 0.15 -> 0.07  Likely secondary to #1. - LHC in 2014 with minimal CAD.  14) PACs vs ectopic atrial rhythm - EKG  Pending  Medication concerns reviewed with patient and pharmacy team. Barriers identified: compliance  Length of Stay: Harvey, NP  02/15/2018, 8:06 AM  Advanced Heart Failure Team Pager [phone] (M-F; 7a - 4p)  Please contact [facility] for night-coverage after hours (4p -7a ) and weekends on [email or web address]  Patient seen with NP, agree with the above note.  Low output HF, started on milrinone yesterday and co-ox up to 58%.  Would use milrinone to diurese him in hospital but think he would be a poor candidate for home milrinone.  Also poor candidate for advanced therapies.  - Continue Lasix 80 mg IV bid today, good diuresis yesterday and CVP remains 15 with volume overload on exam.  - Increase eplerenone to 25 mg daily, continue other meds.   Creatinine improved today with milrinone.   Loralie Champagne 02/15/2018 8:52 AM

## 2018-02-15 NOTE — Progress Notes (Signed)
  Echocardiogram 2D Echocardiogram has been performed.  Marybelle Killings 02/15/2018, 12:34 PM

## 2018-02-15 NOTE — Plan of Care (Signed)
  Problem: Education: Goal: Knowledge of General Education information will improve Description Including pain rating scale, medication(s)/side effects and non-pharmacologic comfort measures Outcome: Progressing   Problem: Education: Goal: Ability to verbalize understanding of medication therapies will improve Outcome: Progressing   Problem: Activity: Goal: Capacity to carry out activities will improve Outcome: Progressing

## 2018-02-15 NOTE — Progress Notes (Signed)
PROGRESS NOTE    [name] [name]  PYK:[id number] DOB: August 27, 1955 DOA: 02/13/2018 PCP: [name], MD   Brief Narrative: Patient is a 63 year old male with past medical history of systolic congestive heart failure with ejection fraction of 25 to 30%, diabetes mellitus,morbid obesity, chronic stasis dermatitis, atrial fibrillation on anticoagulation with Xarelto who presented from home with acute onset of shortness of breath.  Also had episodes of orthopnea.  Complaint of nonproductive cough and increased lower extremity edema.  Patient was just discharged from here on 1/17 after being treated for CHF exacerbation. Patient admitted for acute CHF exacerbation again this time.  Started on IV diuresis and milrinone.Heart failure team following.  Assessment & Plan:   Principal Problem:   Acute on chronic systolic CHF (congestive heart failure) (HCC) Active Problems:   HLD (hyperlipidemia)   CKD (chronic kidney disease) stage 2, GFR 60-89 ml/min   Heart failure with reduced ejection fraction, NYHA class III (HCC)   AF (paroxysmal atrial fibrillation) (HCC)   Type 2 diabetes mellitus (HCC)   SOB (shortness of breath)   Elevated troponin   CKD (chronic kidney disease), stage II   Stasis dermatitis of both legs   Acute exacerbation of CHF (congestive heart failure) (HCC)   Acute on chronic respiratory failure with hypoxia (HCC)   Diabetic ulcer of lower leg (HCC)  Acute  CHF exacerbation: Has history of systolic heart failure.  Ejection fraction of 25 to 30% as per echocardiogram on 8/19.  He follows with cardiology as an outpatient. Was just discharged from here on 1/17 after being treated for the same.  Came with increased shortness of breath, lower extremity edema, orthopnea. Found to be hypoxic on presentation. Started on IV diuresis.  Currently on Lasix 80 mg twice a day.  He is on torsemide 80 mg daily at home.Started on milrinone. Having good diuresis .Continue fluid  restriction, monitoring of input and output. Also on carvedilol, digoxin, BiDil, Entresto,eplerenone which we will continue.  AKI on CKD stage III: Improved.  Currently on baseline  Paroxysmal A. fib: Currently on Xarelto.CHA2DS2-VAScof 6. Currently heart rate is controlled.  Continue Coreg.  Uncontrolled type 2 diabetes: Hemoglobin A1c of 8.2.  Continue sliding scale insulin here, long-acting insulin.  On metformin and insulin  at home.  Hypertension: Currently blood pressure stable.  Continue carvedilol, BiDil  Hyperlipidemia: Continue Crestor  Schizophrenia: .  On Seroquel .currently mood is stable.  Left lower extremity ulcer: Has shallow ulcer on the left leg.  Most likely has chronic venous stasis and exacerbated by lower extremity edema due to CHF.  Continue wound care.          DVT prophylaxis:Xarelto Code Status: Full Family Communication: None present at the bedside Disposition Plan: Home after optimization of CHF status   Consultants: Heart failure  Procedures: None  Antimicrobials:  Anti-infectives (From admission, onward)   None      Subjective:  Patient seen and examined the bedside this morning.  Remains comfortable.  Hemodynamically stable.  Denies any dyspnea.   Objective: Vitals:   02/15/18 0517 02/15/18 0820 02/15/18 0909 02/15/18 0934  BP: 120/75 122/71    Pulse: 66  99 95  Resp:   (!) 22   Temp: (!) 97.5 F (36.4 C) (!) 97.4 F (36.3 C)    TempSrc: Axillary Oral    SpO2: 93%  93%   Weight: 126.7 kg     Height:        Intake/Output Summary (Last 24 hours) at  02/15/2018 1249 Last data filed at 02/15/2018 1003 Gross per 24 hour  Intake 1298.59 ml  Output 5100 ml  Net -3801.41 ml   Filed Weights   02/13/18 1516 02/14/18 0300 02/15/18 0517  Weight: 129 kg 129.1 kg 126.7 kg    Examination:  General exam:Not in distress, morbidly obese HEENT:PERRL,Oral mucosa moist, Ear/Nose normal on gross exam Respiratory system: Bilateral  decreased air entry on the bases Cardiovascular system: S1 & S2 heard, RRR. No JVD, murmurs, rubs, gallops or clicks. Gastrointestinal system: Abdomen is nondistended, soft and nontender. No organomegaly or masses felt. Normal bowel sounds heard. Central nervous system: Alert and oriented. No focal neurological deficits. Extremities: 2-3 + lower extremity  edema, no clubbing ,no cyanosis, distal peripheral pulses palpable. Skin: Shallow ulcer on the left lower extremity   Data Reviewed: I have personally reviewed following labs and imaging studies  CBC: Recent Labs  Lab 02/13/18 1007 02/14/18 0507  WBC 6.2 6.8  NEUTROABS 4.1  --   HGB 12.2* 11.4*  HCT 39.6 37.1*  MCV 93.2 93.0  PLT 247 387   Basic Metabolic Panel: Recent Labs  Lab 02/10/18 0435 02/11/18 0241 02/13/18 1007 02/14/18 0507 02/14/18 1126 02/15/18 0429  NA 138 139 139 140  --  141  K 3.7 4.0 3.9 4.2  --  3.6  CL 101 102 104 103  --  107  CO2 29 30 26 27   --  27  GLUCOSE 149* 153* 116* 142*  --  107*  BUN 25* 25* 28* 29*  --  22  CREATININE 1.53* 1.53* 1.55* 1.63*  --  1.32*  CALCIUM 10.2 10.1 9.8 9.7  --  9.7  MG  --   --   --   --  2.1 2.3   GFR: Estimated Creatinine Clearance: 78.8 mL/min (A) (by C-G formula based on SCr of 1.32 mg/dL (H)). Liver Function Tests: Recent Labs  Lab 02/13/18 1007 02/14/18 0507  AST 41 40  ALT 45* 38  ALKPHOS 101 93  BILITOT 1.0 0.6  PROT 7.7 7.1  ALBUMIN 3.7 3.5   No results for input(s): LIPASE, AMYLASE in the last 168 hours. No results for input(s): AMMONIA in the last 168 hours. Coagulation Profile: No results for input(s): INR, PROTIME in the last 168 hours. Cardiac Enzymes: Recent Labs  Lab 02/08/18 1415 02/14/18 1126  TROPONINI 0.10* 0.07*   BNP (last 3 results) No results for input(s): PROBNP in the last 8760 hours. HbA1C: Recent Labs    02/13/18 1007  HGBA1C 8.2*   CBG: Recent Labs  Lab 02/14/18 1127 02/14/18 1250 02/14/18 2133  02/15/18 0739 02/15/18 1228  GLUCAP 169* 135* 131* 176* 137*   Lipid Profile: No results for input(s): CHOL, HDL, LDLCALC, TRIG, CHOLHDL, LDLDIRECT in the last 72 hours. Thyroid Function Tests: No results for input(s): TSH, T4TOTAL, FREET4, T3FREE, THYROIDAB in the last 72 hours. Anemia Panel: No results for input(s): VITAMINB12, FOLATE, FERRITIN, TIBC, IRON, RETICCTPCT in the last 72 hours. Sepsis Labs: No results for input(s): PROCALCITON, LATICACIDVEN in the last 168 hours.  Recent Results (from the past 240 hour(s))  MRSA PCR Screening     Status: None   Collection Time: 02/14/18 12:25 PM  Result Value Ref Range Status   MRSA by PCR NEGATIVE NEGATIVE Final    Comment:        The GeneXpert MRSA Assay (FDA approved for NASAL specimens only), is one component of a comprehensive MRSA colonization surveillance program. It is not intended to  diagnose MRSA infection nor to guide or monitor treatment for MRSA infections. Performed at [facility], [place] [address], [place], [place] [address]          Radiology Studies: Vas [place] Abi With/wo Tbi  Result Date: 02/14/2018 LOWER EXTREMITY DOPPLER STUDY Indications: Ulceration, and peripheral artery disease.  Performing Technologist: Abram Sander RVS  Examination Guidelines: A complete evaluation includes at minimum, Doppler waveform signals and systolic blood pressure reading at the level of bilateral brachial, anterior tibial, and posterior tibial arteries, when vessel segments are accessible. Bilateral testing is considered an integral part of a complete examination. Photoelectric Plethysmograph (PPG) waveforms and toe systolic pressure readings are included as required and additional duplex testing as needed. Limited examinations for reoccurring indications may be performed as noted.  ABI Findings: +--------+------------------+-----+---------+----------------------------------+ Right   Rt Pressure (mmHg)IndexWaveform Comment                             +--------+------------------+-----+---------+----------------------------------+ Brachial                       triphasicunable to do pressure due to IV                                            location                           +--------+------------------+-----+---------+----------------------------------+ PTA     133               1.07 triphasic                                   +--------+------------------+-----+---------+----------------------------------+ DP      135               1.09 triphasic                                   +--------+------------------+-----+---------+----------------------------------+ +--------+------------------+-----+---------+-------+ Left    Lt Pressure (mmHg)IndexWaveform Comment +--------+------------------+-----+---------+-------+ [id number]                    triphasic        +--------+------------------+-----+---------+-------+ PTA     143               1.15 triphasic        +--------+------------------+-----+---------+-------+ DP      154               1.24 triphasic        +--------+------------------+-----+---------+-------+ +-------+-----------+-----------+------------+------------+ ABI/TBIToday's ABIToday's TBIPrevious ABIPrevious TBI +-------+-----------+-----------+------------+------------+ Right  1.09                                           +-------+-----------+-----------+------------+------------+ Left   1.24                                           +-------+-----------+-----------+------------+------------+  Summary: Right: Resting right ankle-brachial index is within  normal range. No evidence of significant right lower extremity arterial disease. Left: Resting left ankle-brachial index is within normal range. No evidence of significant left lower extremity arterial disease.  *See table(s) above for measurements and observations.  Electronically signed by Ruta Hinds MD on 02/14/2018 at 5:28:30 PM.   Final    [place] Ekg Site Rite  Result Date: 02/14/2018 If Site Rite image not attached, placement could not be confirmed due to current cardiac rhythm.       Scheduled Meds: . allopurinol  100 mg Oral Daily  . digoxin  0.125 mg Oral Daily  . eplerenone  25 mg Oral QHS  . furosemide  80 mg Intravenous BID  . insulin aspart  15 Units Subcutaneous TID AC  . insulin glargine  20 Units Subcutaneous QHS  . isosorbide-hydrALAZINE  0.5 tablet Oral TID  . [START ON 02/16/2018] potassium chloride  40 mEq Oral Daily  . QUEtiapine  800 mg Oral QHS  . rivaroxaban  20 mg Oral Daily  . rosuvastatin  40 mg Oral Daily  . sacubitril-valsartan  1 tablet Oral BID  . sodium chloride flush  10-40 mL Intracatheter Q12H   Continuous Infusions: . milrinone 0.25 mcg/kg/min (02/15/18 1126)     LOS: 1 day    Time spent:35 mins. More than 50% of that time was spent in counseling and/or coordination of care.      Shelly Coss, MD [facility] Pager [phone]  If 7PM-7AM, please contact night-coverage [email or web address] Password [facility] 02/15/2018, 12:49 PM

## 2018-02-16 LAB — BASIC METABOLIC PANEL
Anion gap: 6 (ref 5–15)
BUN: 16 mg/dL (ref 8–23)
CALCIUM: 9.6 mg/dL (ref 8.9–10.3)
CO2: 25 mmol/L (ref 22–32)
Chloride: 106 mmol/L (ref 98–111)
Creatinine, Ser: 1.05 mg/dL (ref 0.61–1.24)
GFR calc Af Amer: >60 mL/min (ref >60)
GFR calc non Af Amer: >60 mL/min (ref >60)
Glucose, Bld: 172 mg/dL — ABNORMAL HIGH (ref 70–99)
Potassium: 3.8 mmol/L (ref 3.5–5.1)
Sodium: 137 mmol/L (ref 135–145)

## 2018-02-16 LAB — COOXEMETRY PANEL
Carboxyhemoglobin: 1.8 % — ABNORMAL HIGH (ref 0.5–1.5)
Methemoglobin: 0.9 % (ref 0.0–1.5)
O2 Saturation: 55.4 %
Total hemoglobin: 13.8 g/dL (ref 12.0–16.0)

## 2018-02-16 LAB — GLUCOSE, CAPILLARY
GLUCOSE-CAPILLARY: 166 mg/dL — AB (ref 70–99)
Glucose-Capillary: 140 mg/dL — ABNORMAL HIGH (ref 70–99)
Glucose-Capillary: 141 mg/dL — ABNORMAL HIGH (ref 70–99)
Glucose-Capillary: 203 mg/dL — ABNORMAL HIGH (ref 70–99)

## 2018-02-16 LAB — MAGNESIUM: Magnesium: 2.1 mg/dL (ref 1.7–2.4)

## 2018-02-16 NOTE — Progress Notes (Signed)
PROGRESS NOTE    [name] [name]  CXK:[id number] DOB: 1955-07-11 DOA: 02/13/2018 PCP: [name], MD   Brief Narrative: Patient is a 63 year old male with past medical history of systolic congestive heart failure with ejection fraction of 25 to 30%, diabetes mellitus,morbid obesity, chronic stasis dermatitis, atrial fibrillation on anticoagulation with Xarelto who presented from home with acute onset of shortness of breath.  Also had episodes of orthopnea.  Complaint of nonproductive cough and increased lower extremity edema.  Patient was just discharged from here on 1/17 after being treated for CHF exacerbation. Patient admitted for acute CHF exacerbation again this time.  Started on IV diuresis and milrinone.Heart failure team following.  Assessment & Plan:   Principal Problem:   Acute on chronic systolic CHF (congestive heart failure) (HCC) Active Problems:   HLD (hyperlipidemia)   CKD (chronic kidney disease) stage 2, GFR 60-89 ml/min   Heart failure with reduced ejection fraction, NYHA class III (HCC)   AF (paroxysmal atrial fibrillation) (HCC)   Type 2 diabetes mellitus (HCC)   SOB (shortness of breath)   Elevated troponin   CKD (chronic kidney disease), stage II   Stasis dermatitis of both legs   Acute exacerbation of CHF (congestive heart failure) (HCC)   Acute on chronic respiratory failure with hypoxia (HCC)   Diabetic ulcer of lower leg (HCC)  Acute  CHF exacerbation: Has history of systolic heart failure.  Ejection fraction of 25 to 30% as per echocardiogram on 8/19.  He follows with cardiology as an outpatient. Was just discharged from here on 1/17 after being treated for the same.  Came with increased shortness of breath, lower extremity edema, orthopnea. Found to be hypoxic on presentation. Started on IV diuresis.    He is on torsemide 80 mg daily at home.Plan is to resumed torsemide soon.Started on milrinone. Having good diuresis .Continue fluid  restriction, monitoring of input and output. He was  on carvedilol, digoxin, BiDil, Entresto,eplerenone at home.  AKI on CKD stage III: Resolved.  Currently on baseline  A. Fib with RVR: Rate in the range of 120 this morning.  Currently on Xarelto.CHA2DS2-VAScof 6. Currently heart rate is controlled.  Continue Coreg.  Uncontrolled type 2 diabetes: Hemoglobin A1c of 8.2.  Continue sliding scale insulin here, long-acting insulin.  On metformin and insulin  at home.  Hypertension: Currently blood pressure stable.  Continue carvedilol, BiDil  Hyperlipidemia: Continue Crestor  Schizophrenia: .  On Seroquel .currently mood is stable.  Left lower extremity ulcer: Has shallow ulcer on the left leg.  Most likely has chronic venous stasis and exacerbated by lower extremity edema due to CHF.  Continue wound care.          DVT prophylaxis:Xarelto Code Status: Full Family Communication: None present at the bedside Disposition Plan: Home after optimization of CHF status   Consultants: Heart failure  Procedures: None  Antimicrobials:  Anti-infectives (From admission, onward)   None      Subjective:  Patient seen and examined the bedside this morning.  Remains comfortable.  Sitting in the chair.  Denies any dyspnea.  Lower extremity edema improving.   Objective: Vitals:   02/15/18 2057 02/16/18 0022 02/16/18 0424 02/16/18 0940  BP: 122/76 123/69 125/79   Pulse: 76 (!) 115 (!) 111 (!) 114  Resp: (!) 22 (!) 23 (!) 25   Temp: 97.6 F (36.4 C) 98.4 F (36.9 C) 98.2 F (36.8 C)   TempSrc: Oral Oral Oral   SpO2: 96% 94% 95%  Weight:   124.7 kg   Height:        Intake/Output Summary (Last 24 hours) at 02/16/2018 1151 Last data filed at 02/16/2018 0943 Gross per 24 hour  Intake 1640.43 ml  Output 3150 ml  Net -1509.57 ml   Filed Weights   02/14/18 0300 02/15/18 0517 02/16/18 0424  Weight: 129.1 kg 126.7 kg 124.7 kg    Examination:  General exam: Not in distress,  morbidly obese  HEENT:PERRL,Oral mucosa moist, Ear/Nose normal on gross exam Respiratory system: Bilateral decreased air entry on the bases Cardiovascular system: S1 & S2 heard, RRR. No JVD, murmurs, rubs, gallops or clicks. Gastrointestinal system: Abdomen is nondistended, soft and nontender. No organomegaly or masses felt. Normal bowel sounds heard. Central nervous system: Alert and oriented. No focal neurological deficits. Extremities: Trace edema, no clubbing ,no cyanosis, distal peripheral pulses palpable. Skin: Shallow ulcer on the left lower extremity, compression stockings      Data Reviewed: I have personally reviewed following labs and imaging studies  CBC: Recent Labs  Lab 02/13/18 1007 02/14/18 0507  WBC 6.2 6.8  NEUTROABS 4.1  --   HGB 12.2* 11.4*  HCT 39.6 37.1*  MCV 93.2 93.0  PLT 247 824   Basic Metabolic Panel: Recent Labs  Lab 02/11/18 0241 02/13/18 1007 02/14/18 0507 02/14/18 1126 02/15/18 0429 02/16/18 0413  NA 139 139 140  --  141 137  K 4.0 3.9 4.2  --  3.6 3.8  CL 102 104 103  --  107 106  CO2 30 26 27   --  27 25  GLUCOSE 153* 116* 142*  --  107* 172*  BUN 25* 28* 29*  --  22 16  CREATININE 1.53* 1.55* 1.63*  --  1.32* 1.05  CALCIUM 10.1 9.8 9.7  --  9.7 9.6  MG  --   --   --  2.1 2.3  --    GFR: Estimated Creatinine Clearance: 98.2 mL/min (by C-G formula based on SCr of 1.05 mg/dL). Liver Function Tests: Recent Labs  Lab 02/13/18 1007 02/14/18 0507  AST 41 40  ALT 45* 38  ALKPHOS 101 93  BILITOT 1.0 0.6  PROT 7.7 7.1  ALBUMIN 3.7 3.5   No results for input(s): LIPASE, AMYLASE in the last 168 hours. No results for input(s): AMMONIA in the last 168 hours. Coagulation Profile: No results for input(s): INR, PROTIME in the last 168 hours. Cardiac Enzymes: Recent Labs  Lab 02/14/18 1126  TROPONINI 0.07*   BNP (last 3 results) No results for input(s): PROBNP in the last 8760 hours. HbA1C: No results for input(s): HGBA1C in the  last 72 hours. CBG: Recent Labs  Lab 02/15/18 0739 02/15/18 1228 02/15/18 1642 02/15/18 2107 02/16/18 0732  GLUCAP 176* 137* 112* 109* 140*   Lipid Profile: No results for input(s): CHOL, HDL, LDLCALC, TRIG, CHOLHDL, LDLDIRECT in the last 72 hours. Thyroid Function Tests: No results for input(s): TSH, T4TOTAL, FREET4, T3FREE, THYROIDAB in the last 72 hours. Anemia Panel: No results for input(s): VITAMINB12, FOLATE, FERRITIN, TIBC, IRON, RETICCTPCT in the last 72 hours. Sepsis Labs: No results for input(s): PROCALCITON, LATICACIDVEN in the last 168 hours.  Recent Results (from the past 240 hour(s))  MRSA PCR Screening     Status: None   Collection Time: 02/14/18 12:25 PM  Result Value Ref Range Status   MRSA by PCR NEGATIVE NEGATIVE Final    Comment:        The GeneXpert MRSA Assay (FDA approved for NASAL  specimens only), is one component of a comprehensive MRSA colonization surveillance program. It is not intended to diagnose MRSA infection nor to guide or monitor treatment for MRSA infections. Performed at [facility], [place] [address], [place],  [address]          Radiology Studies: Vas [place] Abi With/wo Tbi  Result Date: 02/14/2018 LOWER EXTREMITY DOPPLER STUDY Indications: Ulceration, and peripheral artery disease.  Performing Technologist: Abram Sander RVS  Examination Guidelines: A complete evaluation includes at minimum, Doppler waveform signals and systolic blood pressure reading at the level of bilateral brachial, anterior tibial, and posterior tibial arteries, when vessel segments are accessible. Bilateral testing is considered an integral part of a complete examination. Photoelectric Plethysmograph (PPG) waveforms and toe systolic pressure readings are included as required and additional duplex testing as needed. Limited examinations for reoccurring indications may be performed as noted.  ABI Findings:  +--------+------------------+-----+---------+----------------------------------+ Right   Rt Pressure (mmHg)IndexWaveform Comment                            +--------+------------------+-----+---------+----------------------------------+ Brachial                       triphasicunable to do pressure due to IV                                            location                           +--------+------------------+-----+---------+----------------------------------+ PTA     133               1.07 triphasic                                   +--------+------------------+-----+---------+----------------------------------+ DP      135               1.09 triphasic                                   +--------+------------------+-----+---------+----------------------------------+ +--------+------------------+-----+---------+-------+ Left    Lt Pressure (mmHg)IndexWaveform Comment +--------+------------------+-----+---------+-------+ [id number]                    triphasic        +--------+------------------+-----+---------+-------+ PTA     143               1.15 triphasic        +--------+------------------+-----+---------+-------+ DP      154               1.24 triphasic        +--------+------------------+-----+---------+-------+ +-------+-----------+-----------+------------+------------+ ABI/TBIToday's ABIToday's TBIPrevious ABIPrevious TBI +-------+-----------+-----------+------------+------------+ Right  1.09                                           +-------+-----------+-----------+------------+------------+ Left   1.24                                           +-------+-----------+-----------+------------+------------+  Summary: Right: Resting right ankle-brachial index is within normal range. No evidence of significant right lower extremity arterial disease. Left: Resting left ankle-brachial index is within normal range. No evidence of significant  left lower extremity arterial disease.  *See table(s) above for measurements and observations.  Electronically signed by Ruta Hinds MD on 02/14/2018 at 5:28:30 PM.   Final         Scheduled Meds: . allopurinol  100 mg Oral Daily  . digoxin  0.125 mg Oral Daily  . eplerenone  25 mg Oral QHS  . insulin aspart  15 Units Subcutaneous TID AC  . insulin glargine  20 Units Subcutaneous QHS  . isosorbide-hydrALAZINE  0.5 tablet Oral TID  . potassium chloride  40 mEq Oral Daily  . QUEtiapine  800 mg Oral QHS  . rivaroxaban  20 mg Oral Daily  . rosuvastatin  40 mg Oral Daily  . sacubitril-valsartan  1 tablet Oral BID  . sodium chloride flush  10-40 mL Intracatheter Q12H   Continuous Infusions: . milrinone 0.25 mcg/kg/min (02/16/18 0711)     LOS: 2 days    Time spent:35 mins. More than 50% of that time was spent in counseling and/or coordination of care.      Shelly Coss, MD [facility] Pager [phone]  If 7PM-7AM, please contact night-coverage [email or web address] Password [facility] 02/16/2018, 11:51 AM

## 2018-02-16 NOTE — Progress Notes (Signed)
Pt noted on telemetry to have 16 beat run of [place]. Pt remains asymptomatic. [facility] on-call notified.  Awaiting orders. Will continue to monitor patient condition closely.

## 2018-02-16 NOTE — Progress Notes (Addendum)
Advanced Heart Failure Rounding Note  PCP-Cardiologist: Dr Haroldine Laws  Subjective:    PICC line placed and coox came back at 39% 1/20. Started on milrinone 0.25 mcg/kg/min. Coox 55% today. Creatinine improved with milrinone, 1.05 today.   Remains on 80 mg IV lasix BID. Good diuresis with I/O negative 2.6 L. Weight down another 5 lbs overnight (9 lbs total). CVP 7  16 beats NSVT overnight. K 3.8. Add on mag. Looks like he is in afib 110-120s. EKG ordered.  He is drowsy. Says SOB is a whole lot better. Denies CP or dizziness.  Echo 02/15/18: EF 20-25%, severe global HK, mild RV dysfunction  Objective:   Weight Range: 124.7 kg Body mass index is 37.28 kg/m.   Vital Signs:   Temp:  [97.4 F (36.3 C)-98.4 F (36.9 C)] 98.2 F (36.8 C) (01/22 0424) Pulse Rate:  [76-115] 111 (01/22 0424) Resp:  [22-27] 25 (01/22 0424) BP: (122-133)/(69-90) 125/79 (01/22 0424) SpO2:  [93 %-96 %] 95 % (01/22 0424) Weight:  [124.7 kg] 124.7 kg (01/22 0424) Last BM Date: 02/14/18  Weight change: Filed Weights   02/14/18 0300 02/15/18 0517 02/16/18 0424  Weight: 129.1 kg 126.7 kg 124.7 kg    Intake/Output:   Intake/Output Summary (Last 24 hours) at 02/16/2018 0843 Last data filed at 02/16/2018 0649 Gross per 24 hour  Intake 1988.43 ml  Output 4250 ml  Net -2261.57 ml      Physical Exam    General: Sitting in chair. No resp difficulty. Flat affect. HEENT: Normal Neck: Supple. JVP 7. Carotids 2+ bilat; no bruits. No thyromegaly or nodule noted. Cor: PMI nondisplaced. Irregular, tachy, No M/G/R noted Lungs: CTAB, normal effort. Abdomen: Soft, non-tender, non-distended, no HSM. No bruits or masses. +BS  Extremities: No cyanosis, clubbing, or rash. R and LLE 1+ edema, tight. BLE TED hose on. RUE PICC. Neuro: Alert & orientedx3, cranial nerves grossly intact. moves all 4 extremities w/o difficulty. Affect flat  Telemetry   NSR with frequent PACs -> now looks like Afib 110-120s  EKG     NSR 81 bpm with frequent PACs. Personally reviewed.   Labs    CBC Recent Labs    02/13/18 1007 02/14/18 0507  WBC 6.2 6.8  NEUTROABS 4.1  --   HGB 12.2* 11.4*  HCT 39.6 37.1*  MCV 93.2 93.0  PLT 247 542   Basic Metabolic Panel Recent Labs    02/14/18 1126 02/15/18 0429 02/16/18 0413  NA  --  141 137  K  --  3.6 3.8  CL  --  107 106  CO2  --  27 25  GLUCOSE  --  107* 172*  BUN  --  22 16  CREATININE  --  1.32* 1.05  CALCIUM  --  9.7 9.6  MG 2.1 2.3  --    Liver Function Tests Recent Labs    02/13/18 1007 02/14/18 0507  AST 41 40  ALT 45* 38  ALKPHOS 101 93  BILITOT 1.0 0.6  PROT 7.7 7.1  ALBUMIN 3.7 3.5   No results for input(s): LIPASE, AMYLASE in the last 72 hours. Cardiac Enzymes Recent Labs    02/14/18 1126  TROPONINI 0.07*    BNP: BNP (last 3 results) Recent Labs    02/08/18 0029 02/13/18 1007 02/14/18 0507  BNP 243.1* 423.4* 354.1*    ProBNP (last 3 results) No results for input(s): PROBNP in the last 8760 hours.   D-Dimer No results for input(s): DDIMER in the last  72 hours. Hemoglobin A1C Recent Labs    02/13/18 1007  HGBA1C 8.2*   Fasting Lipid Panel No results for input(s): CHOL, HDL, LDLCALC, TRIG, CHOLHDL, LDLDIRECT in the last 72 hours. Thyroid Function Tests No results for input(s): TSH, T4TOTAL, T3FREE, THYROIDAB in the last 72 hours.  Invalid input(s): FREET3  Other results:   Imaging    No results found.   Medications:     Scheduled Medications: . allopurinol  100 mg Oral Daily  . digoxin  0.125 mg Oral Daily  . eplerenone  25 mg Oral QHS  . furosemide  80 mg Intravenous BID  . insulin aspart  15 Units Subcutaneous TID AC  . insulin glargine  20 Units Subcutaneous QHS  . isosorbide-hydrALAZINE  0.5 tablet Oral TID  . potassium chloride  40 mEq Oral Daily  . QUEtiapine  800 mg Oral QHS  . rivaroxaban  20 mg Oral Daily  . rosuvastatin  40 mg Oral Daily  . sacubitril-valsartan  1 tablet Oral BID   . sodium chloride flush  10-40 mL Intracatheter Q12H    Infusions: . milrinone 0.25 mcg/kg/min (02/16/18 0711)    PRN Medications: acetaminophen, sodium chloride flush    Patient Profile   [name] [name] is a 63 y.o. 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 Had history of acute renal failure in setting of rhabdo requiring short term HD.   Presented to [facility] on 02/13/18 with SOB. He has had frequent admissions for volume overload.   Assessment/Plan   1) Acute on chronic combined systolic/diastolic HF: EF 51-02%, grade II DD (10/2012) now decreased to 20-25% (05/2015) RV severely reduced. Echo 4/19 EF 30-35% RV ok. Had [place] 2014 with minimal CAD. Holter showed <3% PVC burden.  - Echo 05/31/17 EF 25-30% - Echo 09/08/17 LVEF 25-30%, Mild LAE. - Echo 02/15/18: EF 20-25%, severe global HK, mild RV dysfunction - Volume improved. Good response with IV lasix and milrinone yesterday. CVP down to 7. Coox 39% after PICC placed. Improved to 58% on milrinone -> 55% today on milrinone 0.25 mcg/kg/min. Continue to augment diuresis. He is not a candidate for home inotropes.  - Received 80 mg IV x1 this am. Plan to switch to PO tonight vs tomorrow. He is on torsemide 80 mg daily at home. I think he will need higher dose for home if he really has been compliant, but main issue is low output.  - Continue eplerenone 25 mg daily. (breast tenderness with spiro) - Hold BB with low output -Continue Entresto 24/26 mg BID.  - Continue bidil to 1/2 tab TID.  - Continue digoxin 0.125 mg daily. Dig level <0.2 1/19. - Refuses HF paramedicine.  - Not a candidate for ICD with recurrent wounds. - He is not a great candidate for advanced therapies or home inotropes due to noncompliance 2) Acute hypoxic  respiratory failure - Now on RA.  3) PAF:  - Continue Xarelto 20 mg daily.Denies any missed doses - Looks like he is back in Afib today. Will check EKG. Likely add amio drip.  4) CKD stage III -Creatinine baseline 1.3-1.5 - Monitor daily BMET. Creatinine 1.63 -> 1.32 -> 1.05 today. Improved with milrinone.  5) ?OSA -Has been ordered for sleep study multiple times but not attended. Would benefit from home sleep study once available. No change.  6) DMII -Per  primary. No change.  7) Medication Noncompliance - Refuses HF paramedicine. No change.  8) Hyperlipidemia - Continuecrestor. No change.  9) CVA - Noted 09/2017 -No deficits. - Continue Xarelto. No change.  10) Hx of ETOH and cocaine abuse - Denies recent use. No change.  11) LLE wound - Chronic venous stasis wounds. WOC consulted and gave recommendations. Per primary. No change.  - ABIs 02/14/18 normal.  12) NSVT - K 3.8. Mag added on.  13) Elevated troponin - Troponin 0.15 -> 0.07  Likely secondary to #1. - LHC in 2014 with minimal CAD.  - No s/s ischemia.  14) PACs vs ectopic atrial rhythm - EKG yesterday showed NSR with frequent PACs. Looks like he is back in afib today. See above.  Medication concerns reviewed with patient and pharmacy team. Barriers identified: compliance  Length of Stay: [place], NP  02/16/2018, 8:43 AM  Advanced Heart Failure Team Pager [phone] (M-F; [place])  Please contact [facility] for night-coverage after hours (4p -7a ) and weekends on [email or web address]   Patient seen and examined with the above-signed Advanced Practice Provider and/or Housestaff. I personally reviewed laboratory data, imaging studies and relevant notes. I independently examined the patient and formulated the important aspects of the plan. I have edited the note to reflect any of my changes or salient points. I have personally discussed the plan with the patient and/or family.  Remains on milrinone.Volume  status now much improved. Will begin milrinone wean. I am concerned that he may be inotrope dependent at this point. He is not candidate for advanced therapies including home inotropes. We had a long talk about the fact that he may be nearing the end of his life he fails milrinone wean. We will follow closely over the next few days as we wean milrinone. Restart diuretics tomorrow. Rhythm looks like MAT. Hopefully will settle down as we wean milrinone. Can consider amio as needed.   Glori Bickers, MD  3:47 PM

## 2018-02-17 LAB — BASIC METABOLIC PANEL
Anion gap: 5 (ref 5–15)
BUN: 14 mg/dL (ref 8–23)
CO2: 25 mmol/L (ref 22–32)
Calcium: 10 mg/dL (ref 8.9–10.3)
Chloride: 108 mmol/L (ref 98–111)
Creatinine, Ser: 1.09 mg/dL (ref 0.61–1.24)
GFR calc Af Amer: >60 mL/min (ref >60)
GFR calc non Af Amer: >60 mL/min (ref >60)
GLUCOSE: 144 mg/dL — AB (ref 70–99)
Potassium: 4.4 mmol/L (ref 3.5–5.1)
Sodium: 138 mmol/L (ref 135–145)

## 2018-02-17 LAB — COOXEMETRY PANEL
Carboxyhemoglobin: 1.9 % — ABNORMAL HIGH (ref 0.5–1.5)
Carboxyhemoglobin: 1.6 % — ABNORMAL HIGH (ref 0.5–1.5)
Methemoglobin: 1.4 % (ref 0.0–1.5)
Methemoglobin: 1.5 % (ref 0.0–1.5)
O2 Saturation: 87.2 %
O2 Saturation: 63.2 %
Total hemoglobin: 13.5 g/dL (ref 12.0–16.0)
Total hemoglobin: 13.1 g/dL (ref 12.0–16.0)

## 2018-02-17 LAB — GLUCOSE, CAPILLARY
Glucose-Capillary: 115 mg/dL — ABNORMAL HIGH (ref 70–99)
Glucose-Capillary: 173 mg/dL — ABNORMAL HIGH (ref 70–99)
Glucose-Capillary: 157 mg/dL — ABNORMAL HIGH (ref 70–99)
Glucose-Capillary: 144 mg/dL — ABNORMAL HIGH (ref 70–99)

## 2018-02-17 MED ORDER — METOLAZONE 5 MG PO TABS
2.5000 mg | ORAL_TABLET | Freq: Once | ORAL | Status: AC
  Administered 2018-02-17: 2.5 mg via ORAL
  Filled 2018-02-17: qty 0.5

## 2018-02-17 MED ORDER — TORSEMIDE 20 MG PO TABS: 80.0000 mg | ORAL_TABLET | Freq: Every day | ORAL | Status: DC

## 2018-02-17 MED ORDER — FUROSEMIDE 10 MG/ML IJ SOLN
80.0000 mg | Freq: Two times a day (BID) | INTRAMUSCULAR | Status: DC
  Administered 2018-02-17: 80 mg via INTRAVENOUS
  Filled 2018-02-17 (×2): qty 8

## 2018-02-17 MED ORDER — FUROSEMIDE 10 MG/ML IJ SOLN
80.0000 mg | Freq: Once | INTRAMUSCULAR | Status: AC
  Administered 2018-02-17: 80 mg via INTRAVENOUS
  Filled 2018-02-17: qty 8

## 2018-02-17 MED ORDER — INSULIN ASPART 100 UNIT/ML ~~LOC~~ SOLN
0.0000 [IU] | Freq: Three times a day (TID) | SUBCUTANEOUS | Status: DC
  Administered 2018-02-17 – 2018-02-19 (×6): 4 [IU] via SUBCUTANEOUS
  Administered 2018-02-19: 7 [IU] via SUBCUTANEOUS

## 2018-02-17 NOTE — Progress Notes (Signed)
PROGRESS NOTE    [name] [name]  XQJ:[id number] DOB: 08/07/1955 DOA: 02/13/2018 PCP: [name], MD   Brief Narrative: Patient is a 63 year old male with past medical history of systolic congestive heart failure with ejection fraction of 25 to 30%, diabetes mellitus,morbid obesity, chronic stasis dermatitis, atrial fibrillation on anticoagulation with Xarelto who presented from home with acute onset of shortness of breath.  Also had episodes of orthopnea.  Complaint of nonproductive cough and increased lower extremity edema.  Patient was just discharged from here on 1/17 after being treated for CHF exacerbation. Patient admitted for acute CHF exacerbation again this time.  Started on IV diuresis and milrinone.Heart failure team following.  Assessment & Plan:   Principal Problem:   Acute on chronic systolic CHF (congestive heart failure) (HCC) Active Problems:   HLD (hyperlipidemia)   CKD (chronic kidney disease) stage 2, GFR 60-89 ml/min   Heart failure with reduced ejection fraction, NYHA class III (HCC)   AF (paroxysmal atrial fibrillation) (HCC)   Type 2 diabetes mellitus (HCC)   SOB (shortness of breath)   Elevated troponin   CKD (chronic kidney disease), stage II   Stasis dermatitis of both legs   Acute exacerbation of CHF (congestive heart failure) (HCC)   Acute on chronic respiratory failure with hypoxia (HCC)   Diabetic ulcer of lower leg (HCC)  Acute  CHF exacerbation: Has history of systolic heart failure.  Ejection fraction of 25 to 30% as per echocardiogram on 8/19.  He follows with cardiology as an outpatient. Was just discharged from here on 1/17 after being treated for the same.  Came with increased shortness of breath, lower extremity edema, orthopnea. Found to be hypoxic on presentation. Started on IV diuresis.    He is on torsemide 80 mg daily at home.Plan is to resumed torsemide soon.Started on milrinone.  Given a dose of Lasix 80 mg today. Continue  fluid restriction, monitoring of input and output. He was  on carvedilol, digoxin, BiDil, Entresto,eplerenone at home.  AKI on CKD stage III: Resolved.  Currently on baseline  A. Fib with RVR: Rate in the range of 100-110 this morning.  Currently on Xarelto.CHA2DS2-VAScof 6.   Continue Coreg.  Uncontrolled type 2 diabetes: Hemoglobin A1c of 8.2.  Continue sliding scale insulin here, long-acting insulin.  On metformin and insulin  at home.  Hypertension: Currently blood pressure stable.  Continue carvedilol, BiDil  Hyperlipidemia: Continue Crestor  Schizophrenia: .  On Seroquel .currently mood is stable.  Left lower extremity ulcer: Has shallow ulcer on the left leg.  Most likely has chronic venous stasis and exacerbated by lower extremity edema due to CHF.  Continue wound care.          DVT prophylaxis:Xarelto Code Status: Full Family Communication: None present at the bedside Disposition Plan: Home after optimization of CHF status as per HF team   Consultants: Heart failure  Procedures: None  Antimicrobials:  Anti-infectives (From admission, onward)   None      Subjective:  Patient seen and examined the bedside this morning.  As usual, he was sitting in the chair.  Denies any chest pain or shortness of breath and says that he is feeling better.  Heart rate slightly up today and was ranging in the 110s  Objective: Vitals:   02/17/18 0529 02/17/18 0728 02/17/18 0904 02/17/18 1056  BP: 97/79 103/66  (!) 127/95  Pulse: 65 (!) 108 (!) 107 (!) 104  Resp: (!) 29     Temp: 97.7 F (  36.5 C)   98.6 F (37 C)  TempSrc: Oral   Oral  SpO2: 95% 95%  91%  Weight: 125.7 kg     Height:        Intake/Output Summary (Last 24 hours) at 02/17/2018 1132 Last data filed at 02/17/2018 1058 Gross per 24 hour  Intake 797.83 ml  Output 1500 ml  Net -702.17 ml   Filed Weights   02/15/18 0517 02/16/18 0424 02/17/18 0529  Weight: 126.7 kg 124.7 kg 125.7 kg     Examination:  General exam:Not  In distress, morbidly obese  HEENT:PERRL,Oral mucosa moist, Ear/Nose normal on gross exam Respiratory system: Bilateral decreased air entry on the bases Cardiovascular system: S1 & S2 heard, RRR. No JVD, murmurs, rubs, gallops or clicks. Gastrointestinal system: Abdomen is nondistended, soft and nontender. No organomegaly or masses felt. Normal bowel sounds heard. Central nervous system: Alert and oriented. No focal neurological deficits. Extremities: 1-2+ lower extremity edema, no clubbing ,no cyanosis, distal peripheral pulses palpable. Skin: Shallow ulcer on the left lower extremity, compression stockings       Data Reviewed: I have personally reviewed following labs and imaging studies  CBC: Recent Labs  Lab 02/13/18 1007 02/14/18 0507  WBC 6.2 6.8  NEUTROABS 4.1  --   HGB 12.2* 11.4*  HCT 39.6 37.1*  MCV 93.2 93.0  PLT 247 354   Basic Metabolic Panel: Recent Labs  Lab 02/13/18 1007 02/14/18 0507 02/14/18 1126 02/15/18 0429 02/16/18 0413 02/17/18 0514  NA 139 140  --  141 137 138  K 3.9 4.2  --  3.6 3.8 4.4  CL 104 103  --  107 106 108  CO2 26 27  --  27 25 25   GLUCOSE 116* 142*  --  107* 172* 144*  BUN 28* 29*  --  22 16 14   CREATININE 1.55* 1.63*  --  1.32* 1.05 1.09  CALCIUM 9.8 9.7  --  9.7 9.6 10.0  MG  --   --  2.1 2.3 2.1  --    GFR: Estimated Creatinine Clearance: 95 mL/min (by C-G formula based on SCr of 1.09 mg/dL). Liver Function Tests: Recent Labs  Lab 02/13/18 1007 02/14/18 0507  AST 41 40  ALT 45* 38  ALKPHOS 101 93  BILITOT 1.0 0.6  PROT 7.7 7.1  ALBUMIN 3.7 3.5   No results for input(s): LIPASE, AMYLASE in the last 168 hours. No results for input(s): AMMONIA in the last 168 hours. Coagulation Profile: No results for input(s): INR, PROTIME in the last 168 hours. Cardiac Enzymes: Recent Labs  Lab 02/14/18 1126  TROPONINI 0.07*   BNP (last 3 results) No results for input(s): PROBNP in the  last 8760 hours. HbA1C: No results for input(s): HGBA1C in the last 72 hours. CBG: Recent Labs  Lab 02/16/18 1214 02/16/18 1617 02/16/18 2103 02/17/18 0726 02/17/18 1054  GLUCAP 141* 203* 166* 115* 173*   Lipid Profile: No results for input(s): CHOL, HDL, LDLCALC, TRIG, CHOLHDL, LDLDIRECT in the last 72 hours. Thyroid Function Tests: No results for input(s): TSH, T4TOTAL, FREET4, T3FREE, THYROIDAB in the last 72 hours. Anemia Panel: No results for input(s): VITAMINB12, FOLATE, FERRITIN, TIBC, IRON, RETICCTPCT in the last 72 hours. Sepsis Labs: No results for input(s): PROCALCITON, LATICACIDVEN in the last 168 hours.  Recent Results (from the past 240 hour(s))  MRSA PCR Screening     Status: None   Collection Time: 02/14/18 12:25 PM  Result Value Ref Range Status   MRSA by PCR  NEGATIVE NEGATIVE Final    Comment:        The GeneXpert MRSA Assay (FDA approved for NASAL specimens only), is one component of a comprehensive MRSA colonization surveillance program. It is not intended to diagnose MRSA infection nor to guide or monitor treatment for MRSA infections. Performed at [facility], [place] [address], [place], [place] [address]          Radiology Studies: No results found.      Scheduled Meds: . allopurinol  100 mg Oral Daily  . digoxin  0.125 mg Oral Daily  . eplerenone  25 mg Oral QHS  . furosemide  80 mg Intravenous BID  . insulin aspart  15 Units Subcutaneous TID AC  . insulin glargine  20 Units Subcutaneous QHS  . isosorbide-hydrALAZINE  0.5 tablet Oral TID  . metolazone  2.5 mg Oral Once  . potassium chloride  40 mEq Oral Daily  . QUEtiapine  800 mg Oral QHS  . rivaroxaban  20 mg Oral Daily  . rosuvastatin  40 mg Oral Daily  . sacubitril-valsartan  1 tablet Oral BID  . sodium chloride flush  10-40 mL Intracatheter Q12H   Continuous Infusions: . milrinone 0.125 mcg/kg/min (02/16/18 1834)     LOS: 3 days    Time spent:25 mins. More  than 50% of that time was spent in counseling and/or coordination of care.      Shelly Coss, MD [facility] Pager [phone]  If 7PM-7AM, please contact night-coverage [email or web address] Password [facility] 02/17/2018, 11:32 AM

## 2018-02-17 NOTE — Care Management Important Message (Signed)
Important Message  Patient Details  Name: [name] [name] MRN: [id number] Date of Birth: [birth year]   Medicare Important Message Given:  Yes    Jimia Gentles P [place] 02/17/2018, 11:17 AM

## 2018-02-17 NOTE — Progress Notes (Addendum)
Advanced Heart Failure Rounding Note  PCP-Cardiologist: Dr Haroldine Laws  Subjective:    Milrinone decreased to 0.125 mcg/kg/min yesterday. Coox 87 (inaccurate). Repeat ordered.-> 63%  Received 80 mg IV lasix x1 yesterday. Creatinine is stable 1.09. Weight up 3 lbs. Very little UOP charted yesterday. CVP 11-12 today.  Denies SOB moving around the room. Appetite and energy level fine.   Echo 02/15/18: EF 20-25%, severe global HK, mild RV dysfunction  Objective:   Weight Range: 125.7 kg Body mass index is 37.6 kg/m.   Vital Signs:   Temp:  [97.7 F (36.5 C)-98.6 F (37 C)] 97.7 F (36.5 C) (01/23 0529) Pulse Rate:  [65-114] 108 (01/23 0728) Resp:  [27-30] 29 (01/23 0529) BP: (97-114)/(66-83) 103/66 (01/23 0728) SpO2:  [94 %-98 %] 95 % (01/23 0728) Weight:  [125.7 kg] 125.7 kg (01/23 0529) Last BM Date: 02/14/18  Weight change: Filed Weights   02/15/18 0517 02/16/18 0424 02/17/18 0529  Weight: 126.7 kg 124.7 kg 125.7 kg    Intake/Output:   Intake/Output Summary (Last 24 hours) at 02/17/2018 0817 Last data filed at 02/16/2018 2330 Gross per 24 hour  Intake 927.83 ml  Output 575 ml  Net 352.83 ml      Physical Exam    General: Flat affect. No resp difficulty. HEENT: Normal Neck: Supple. JVP 11-12. Carotids 2+ bilat; no bruits. No thyromegaly or nodule noted. Cor: PMI nondisplaced. IRR, No M/G/R noted Lungs: CTAB, normal effort. Abdomen: Soft, non-tender, non-distended, no HSM. No bruits or masses. +BS  Extremities: No cyanosis, clubbing, or rash. R and LLE 1+ edema. BLE TED hose. RUE PICC.  Neuro: Alert & orientedx3, cranial nerves grossly intact. moves all 4 extremities w/o difficulty. Affect pleasant  Telemetry   Sinus tach 100s with frequent PACs vs MAT. Personally reviewed.   EKG    Sinus tach 108 bpm with PACs vs MAT. Personally reviewed.   Labs    CBC No results for input(s): WBC, NEUTROABS, HGB, HCT, MCV, PLT in the last 72 hours. Basic  Metabolic Panel Recent Labs    02/15/18 0429 02/16/18 0413 02/17/18 0514  NA 141 137 138  K 3.6 3.8 4.4  CL 107 106 108  CO2 27 25 25   GLUCOSE 107* 172* 144*  BUN 22 16 14   CREATININE 1.32* 1.05 1.09  CALCIUM 9.7 9.6 10.0  MG 2.3 2.1  --    Liver Function Tests No results for input(s): AST, ALT, ALKPHOS, BILITOT, PROT, ALBUMIN in the last 72 hours. No results for input(s): LIPASE, AMYLASE in the last 72 hours. Cardiac Enzymes Recent Labs    02/14/18 1126  TROPONINI 0.07*    BNP: BNP (last 3 results) Recent Labs    02/08/18 0029 02/13/18 1007 02/14/18 0507  BNP 243.1* 423.4* 354.1*    ProBNP (last 3 results) No results for input(s): PROBNP in the last 8760 hours.   D-Dimer No results for input(s): DDIMER in the last 72 hours. Hemoglobin A1C No results for input(s): HGBA1C in the last 72 hours. Fasting Lipid Panel No results for input(s): CHOL, HDL, LDLCALC, TRIG, CHOLHDL, LDLDIRECT in the last 72 hours. Thyroid Function Tests No results for input(s): TSH, T4TOTAL, T3FREE, THYROIDAB in the last 72 hours.  Invalid input(s): FREET3  Other results:   Imaging    No results found.   Medications:     Scheduled Medications: . allopurinol  100 mg Oral Daily  . digoxin  0.125 mg Oral Daily  . eplerenone  25 mg Oral QHS  .  insulin aspart  15 Units Subcutaneous TID AC  . insulin glargine  20 Units Subcutaneous QHS  . isosorbide-hydrALAZINE  0.5 tablet Oral TID  . potassium chloride  40 mEq Oral Daily  . QUEtiapine  800 mg Oral QHS  . rivaroxaban  20 mg Oral Daily  . rosuvastatin  40 mg Oral Daily  . sacubitril-valsartan  1 tablet Oral BID  . sodium chloride flush  10-40 mL Intracatheter Q12H    Infusions: . milrinone 0.125 mcg/kg/min (02/16/18 1834)    PRN Medications: acetaminophen, sodium chloride flush    Patient Profile   ROSS BENDER is a 63 y.o. male with history HTN, DM2, schizophrenia, hepatitis C, cerebellar as well as left  brain stem embolic stroke, prostate cancer, alcohol abuse, former cocaine abuse, NICM, atrial fibrillation and chronic combined systolic/diastolic heart failure. Had history of acute renal failure in setting of rhabdo requiring short term HD.   [facility] with SOB. He has had frequent admissions for volume overload.   Assessment/Plan   1) Acute on chronic combined systolic/diastolic HF: EF 67-34%, grade II DD (10/2012) now decreased to 20-25% (05/2015) RV severely reduced. Echo 4/19 EF 30-35% RV ok. Had [place] 2014 with minimal CAD. Holter showed <3% PVC burden.  - Echo 05/31/17 EF 25-30% - Echo 09/08/17 LVEF 25-30%, Mild LAE. - Echo 02/15/18: EF 20-25%, severe global HK, mild RV dysfunction - Volume trending back up. Received 1 dose IV lasix yesterday. CVP 11-12 today. Up 3 lbs overnight. - I will give him 80 mg IV lasix x1 this morning, then resume home torsemide this afternoon. He takes 80 mg daily at home. I think we will need to increase home dose.  - Coox 39% prior to initiating milrinone. Milrinone decreased to 0.125 mcg/kg/min yesterday. Coox inaccurate. Redraw ordered. He is not a candidate for home inotropes.  - Continue eplerenone 25 mg daily. (breast tenderness with spiro) - Hold BB with low output -Continue Entresto 24/26 mg BID.  - Continue bidil to 1/2 tab TID.  - Continue digoxin 0.125 mg daily. Dig level <0.2 1/19. - Refuses HF paramedicine.  - Not a candidate for ICD with recurrent wounds. - He is not a great candidate for advanced therapies or home inotropes due to noncompliance 2) Acute hypoxic respiratory failure - Now on RA. No change.  3) PAF:  - Continue Xarelto 20 mg daily.Denies any missed doses - He is in what looks to be MAT.  4) CKD stage III -Creatinine baseline 1.3-1.5 - Monitor daily BMET. Creatinine 1.63 -> 1.32 -> 1.05 -> 1.09 5) ?OSA -Has been ordered for sleep study multiple times but not attended. Would benefit from home sleep  study once available. No change.  6) DMII -Per primary. No change.  7) Medication Noncompliance - Refuses HF paramedicine. No change.  8) Hyperlipidemia - Continuecrestor. No change.   9) CVA - Noted 09/2017 -No deficits. - Continue Xarelto. No change.  10) Hx of ETOH and cocaine abuse - Denies recent use. No change.  11) LLE wound - Chronic venous stasis wounds. WOC consulted and gave recommendations. Per primary. No change.  - ABIs 02/14/18 normal.  12) NSVT - K 4.4 13) Elevated troponin - Troponin 0.15 -> 0.07  Likely secondary to #1. - LHC in 2014 with minimal CAD.  - No s/s ischemia.  14) PACs vs MAT - Hopefully will improve with weaning milrinone.  - Can consider amio if needed.  Medication concerns reviewed with patient and pharmacy  team. Barriers identified: compliance  Length of Stay: [place], NP  02/17/2018, 8:17 AM  Advanced Heart Failure Team Pager [phone] (M-F; 7a - 4p)  Please contact [facility] for night-coverage after hours (4p -7a ) and weekends on [email or web address]   Patient seen and examined with the above-signed Advanced Practice Provider and/or Housestaff. I personally reviewed laboratory data, imaging studies and relevant notes. I independently examined the patient and formulated the important aspects of the plan. I have edited the note to reflect any of my changes or salient points. I have personally discussed the plan with the patient and/or family.  Milrinone cut back to 0.125. Co-ox at 63% but urine output dropping off. CVP 11-12. Would continue IV lasix today and keep milrinone at 0.125. Can also give dose of metolazone.  If CVP improved and co-ox stable would stop milrinone tomorrow and follow. 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 I discussed this with him.   May need [place] discussion soon.   Glori Bickers, MD  10:01 AM

## 2018-02-18 ENCOUNTER — Other Ambulatory Visit (HOSPITAL_COMMUNITY): Payer: Self-pay | Admitting: Student

## 2018-02-18 DIAGNOSIS — I480 Paroxysmal atrial fibrillation: Secondary | ICD-10-CM

## 2018-02-18 LAB — GLUCOSE, CAPILLARY
Glucose-Capillary: 160 mg/dL — ABNORMAL HIGH (ref 70–99)
Glucose-Capillary: 174 mg/dL — ABNORMAL HIGH (ref 70–99)
Glucose-Capillary: 166 mg/dL — ABNORMAL HIGH (ref 70–99)
Glucose-Capillary: 160 mg/dL — ABNORMAL HIGH (ref 70–99)

## 2018-02-18 LAB — COOXEMETRY PANEL
Carboxyhemoglobin: 1.9 % — ABNORMAL HIGH (ref 0.5–1.5)
Methemoglobin: 1.1 % (ref 0.0–1.5)
O2 SAT: 60.2 %
Total hemoglobin: 13.4 g/dL (ref 12.0–16.0)

## 2018-02-18 LAB — BASIC METABOLIC PANEL
ANION GAP: 8 (ref 5–15)
BUN: 18 mg/dL (ref 8–23)
CHLORIDE: 101 mmol/L (ref 98–111)
CO2: 24 mmol/L (ref 22–32)
Calcium: 10.2 mg/dL (ref 8.9–10.3)
Creatinine, Ser: 1.27 mg/dL — ABNORMAL HIGH (ref 0.61–1.24)
GFR calc Af Amer: >60 mL/min (ref >60)
GFR calc non Af Amer: 60 mL/min — ABNORMAL LOW (ref >60)
Glucose, Bld: 205 mg/dL — ABNORMAL HIGH (ref 70–99)
POTASSIUM: 3.9 mmol/L (ref 3.5–5.1)
Sodium: 133 mmol/L — ABNORMAL LOW (ref 135–145)

## 2018-02-18 MED ORDER — SORBITOL 70 % SOLN
30.0000 mL | Freq: Every day | Status: DC | PRN
  Administered 2018-02-18: 30 mL via ORAL
  Filled 2018-02-18: qty 30

## 2018-02-18 MED ORDER — TORSEMIDE 20 MG PO TABS
80.0000 mg | ORAL_TABLET | Freq: Every day | ORAL | Status: DC
  Administered 2018-02-18: 80 mg via ORAL
  Filled 2018-02-18: qty 4

## 2018-02-18 MED ORDER — TORSEMIDE 20 MG PO TABS
40.0000 mg | ORAL_TABLET | Freq: Every day | ORAL | Status: DC
  Administered 2018-02-18: 40 mg via ORAL
  Filled 2018-02-18: qty 2

## 2018-02-18 NOTE — Progress Notes (Addendum)
Advanced Heart Failure Rounding Note  PCP-Cardiologist: Dr Haroldine Laws  Subjective:    Remains on milrinone 0.125 mcg/kg/min. Coox 60%.  Received 80 mg IV lasix BID + metolazone yesterday. I/O negative 3.7 L. Creatinine trending up 1.09 -> 1.27. Weight down 4 lbs (11 lbs total). CVP 9-10.  He feels fine. Not walking in hallways. Appetite is great.  Echo 02/15/18: EF 20-25%, severe global HK, mild RV dysfunction  Objective:   Weight Range: 124.2 kg Body mass index is 37.15 kg/m.   Vital Signs:   Temp:  [97.5 F (36.4 C)-98.9 F (37.2 C)] 97.5 F (36.4 C) (01/24 0741) Pulse Rate:  [52-128] 128 (01/24 0741) Resp:  [20-32] 20 (01/24 0741) BP: (90-149)/(55-117) 149/117 (01/24 0741) SpO2:  [91 %-100 %] 100 % (01/24 0741) Weight:  [124.2 kg] 124.2 kg (01/24 0524) Last BM Date: 02/14/18  Weight change: Filed Weights   02/16/18 0424 02/17/18 0529 02/18/18 0524  Weight: 124.7 kg 125.7 kg 124.2 kg    Intake/Output:   Intake/Output Summary (Last 24 hours) at 02/18/2018 0835 Last data filed at 02/17/2018 2338 Gross per 24 hour  Intake 567.01 ml  Output 4250 ml  Net -3682.99 ml      Physical Exam    General: Flat affect. No resp difficulty. Obese. Sitting in chair.  HEENT: Normal Neck: Supple. JVP ~10. Carotids 2+ bilat; no bruits. No thyromegaly or nodule noted. Cor: PMI nondisplaced. RRR, No M/G/R noted Lungs: CTAB, normal effort. Abdomen: Soft, non-tender, +distended, no HSM. No bruits or masses. +BS  Extremities: No cyanosis, clubbing, or rash. R and LLE no edema. BLE TED hose. RUE PICC.  Neuro: Alert & orientedx3, cranial nerves grossly intact. moves all 4 extremities w/o difficulty. Affect pleasant  Telemetry   Sinus tach 100-110s with frequent PACs vs MAT. Personally reviewed.   EKG    Sinus tach 120 bpm with PACs vs MAT. Personally reviewed.   Labs    CBC No results for input(s): WBC, NEUTROABS, HGB, HCT, MCV, PLT in the last 72 hours. Basic  Metabolic Panel Recent Labs    02/16/18 0413 02/17/18 0514 02/18/18 0517  NA 137 138 133*  K 3.8 4.4 3.9  CL 106 108 101  CO2 25 25 24   GLUCOSE 172* 144* 205*  BUN 16 14 18   CREATININE 1.05 1.09 1.27*  CALCIUM 9.6 10.0 10.2  MG 2.1  --   --    Liver Function Tests No results for input(s): AST, ALT, ALKPHOS, BILITOT, PROT, ALBUMIN in the last 72 hours. No results for input(s): LIPASE, AMYLASE in the last 72 hours. Cardiac Enzymes No results for input(s): CKTOTAL, CKMB, CKMBINDEX, TROPONINI in the last 72 hours.  BNP: BNP (last 3 results) Recent Labs    02/08/18 0029 02/13/18 1007 02/14/18 0507  BNP 243.1* 423.4* 354.1*    ProBNP (last 3 results) No results for input(s): PROBNP in the last 8760 hours.   D-Dimer No results for input(s): DDIMER in the last 72 hours. Hemoglobin A1C No results for input(s): HGBA1C in the last 72 hours. Fasting Lipid Panel No results for input(s): CHOL, HDL, LDLCALC, TRIG, CHOLHDL, LDLDIRECT in the last 72 hours. Thyroid Function Tests No results for input(s): TSH, T4TOTAL, T3FREE, THYROIDAB in the last 72 hours.  Invalid input(s): FREET3  Other results:   Imaging    No results found.   Medications:     Scheduled Medications: . allopurinol  100 mg Oral Daily  . digoxin  0.125 mg Oral Daily  .  eplerenone  25 mg Oral QHS  . furosemide  80 mg Intravenous BID  . insulin aspart  0-20 Units Subcutaneous TID WC  . insulin glargine  20 Units Subcutaneous QHS  . isosorbide-hydrALAZINE  0.5 tablet Oral TID  . potassium chloride  40 mEq Oral Daily  . QUEtiapine  800 mg Oral QHS  . rivaroxaban  20 mg Oral Daily  . rosuvastatin  40 mg Oral Daily  . sacubitril-valsartan  1 tablet Oral BID  . sodium chloride flush  10-40 mL Intracatheter Q12H    Infusions: . milrinone 0.125 mcg/kg/min (02/17/18 1539)    PRN Medications: acetaminophen, sodium chloride flush    Patient Profile   [name] [name] is a 63 y.o. 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 Had history of acute renal failure in setting of rhabdo requiring short term HD.   Presented to [facility] on 02/13/18 with SOB. He has had frequent admissions for volume overload.   Assessment/Plan   1) Acute on chronic combined systolic/diastolic HF: EF 84-69%, grade II DD (10/2012) now decreased to 20-25% (05/2015) RV severely reduced. Echo 4/19 EF 30-35% RV ok. Had [place] 2014 with minimal CAD. Holter showed <3% PVC burden.  - Echo 05/31/17 EF 25-30% - Echo 09/08/17 LVEF 25-30%, Mild LAE. - Echo 02/15/18: EF 20-25%, severe global HK, mild RV dysfunction - Volume okay. CVP 9-10. Weight down 11 lbs total. Creatinine starting to trend up. Stop IV lasix. Start oral torsemide 80 mg am, 40 mg pm (he was on 80 mg daily at home). - Coox 39% prior to initiating milrinone. Milrinone decreased to 0.125 mcg/kg/min 1/22. Coox 60% today. Stop milrinone today. He is not a candidate for home inotropes. If he doesn't do well off inotropes, will need to get palliative involved. - Continue eplerenone 25 mg daily. (breast tenderness with spiro) - Hold BB with low output -Continue Entresto 24/26 mg BID.  - Continue bidil to 1/2 tab TID.  - Continue digoxin 0.125 mg daily. Dig level <0.2 1/19. - Refuses HF paramedicine.  - Not a candidate for ICD with recurrent wounds. - He is not a great candidate for advanced therapies or home inotropes due to noncompliance 2) Acute hypoxic respiratory failure - Now on RA. No change.  3) PAF:  - Continue Xarelto 20 mg daily.Denies any missed doses - He is in what looks to be MAT. See below.  4) CKD stage III -Creatinine baseline 1.3-1.5 - Monitor daily BMET. Creatinine 1.63 -> 1.32 -> 1.05 -> 1.09 -> 1.27. Switching to oral diuretics as above.    5) ?OSA -Has been ordered for sleep study multiple times but not attended. Would benefit from home sleep study once available. No change.  6) DMII -Per primary. No change.   7) Medication Noncompliance - Refuses HF paramedicine. No change.  8) Hyperlipidemia - Continuecrestor. No change.  9) CVA - Noted 09/2017 -No deficits. - Continue Xarelto. No change.  10) Hx of ETOH and cocaine abuse - Denies recent use. No change.  11) LLE wound - Chronic venous stasis wounds. WOC consulted and gave recommendations. Per primary. No change.  - ABIs 02/14/18 normal.  12) NSVT - K 3.9 today. 13) Elevated troponin - Troponin 0.15 -> 0.07  Likely secondary to #1. - LHC in 2014 with minimal CAD.  - No s/s ischemia. 14) Frequent  PACs vs MAT - Hopefully will improve with weaning milrinone. No change.  - Can consider amio if needed.  Medication concerns reviewed with patient and pharmacy team. Barriers identified: ?compliance  Stop milrinone today. Switch to oral diuretics with creatinine trending up. If he does poorly off milrinone, will need to get palliative involved.   Length of Stay: [place], NP  02/18/2018, 8:35 AM  Advanced Heart Failure Team Pager [phone] (M-F; 7a - 4p)  Please contact [facility] for night-coverage after hours (4p -7a ) and weekends on [email or web address]  Patient seen with NP, agree with the above note.  Good diuresis yesterday, weight is down.  Co-ox 60% today on milrinone 0.125.  CVP 9-10.  Creatinine up to 1.27.  He feels good symptomatically.   Telemetry looks like NSR with frequent runs of atrial tachycardia.   On exam, JVP 8 cm.  Trace ankle edema.  Mildly tachy, regular S1S2.  Lungs clear.   Suspect runs of atrial tachycardia. Will stop milrinone today to see if these resolve.  If not, can use amiodarone to suppress.   Volume status improved, weight down.  Not a candidate for home milrinone or advanced therapies.  Not ICD candidate with chronic  LE wounds.  - Stop milrinone today, co-ox is good.  - Continue digoxin, Bidil, Entresto, eplerenone.  - Stop IV Lasix and start torsemide 80 qam/40 qpm.   Possible discharge home tomorrow if he remains stable.   Loralie Champagne 02/18/2018 10:02 AM

## 2018-02-18 NOTE — Progress Notes (Addendum)
PROGRESS NOTE    [name] [name]  KXF:[id number] DOB: [birth year] DOA: 02/13/2018 PCP: [name], MD   Brief Narrative: Patient is a 63 year old male with past medical history of systolic congestive heart failure with ejection fraction of 25 to 30%, diabetes mellitus,morbid obesity, chronic stasis dermatitis, atrial fibrillation on anticoagulation with Xarelto who presented from home with acute onset of shortness of breath.  Also had episodes of orthopnea.  Complaint of nonproductive cough and increased lower extremity edema.  Patient was just discharged from here on 1/17 after being treated for CHF exacerbation. Patient admitted for acute CHF exacerbation again this time.  Started on IV diuresis and milrinone.Heart failure team following.  Assessment & Plan:   Principal Problem:   Acute on chronic systolic CHF (congestive heart failure) (HCC) Active Problems:   HLD (hyperlipidemia)   CKD (chronic kidney disease) stage 2, GFR 60-89 ml/min   Heart failure with reduced ejection fraction, NYHA class III (HCC)   AF (paroxysmal atrial fibrillation) (HCC)   Type 2 diabetes mellitus (HCC)   SOB (shortness of breath)   Elevated troponin   CKD (chronic kidney disease), stage II   Stasis dermatitis of both legs   Acute exacerbation of CHF (congestive heart failure) (HCC)   Acute on chronic respiratory failure with hypoxia (HCC)   Diabetic ulcer of lower leg (HCC)  Acute  CHF exacerbation: Has history of systolic heart failure.  Ejection fraction of 20--25% as per echocardiogram done on this admission.Was just discharged from here on 1/17 after being treated for the same.  Came with increased shortness of breath, lower extremity edema, orthopnea. Found to be hypoxic on presentation. Started on IV diuresis and  milrinone. Milrinone stopped. Now started on torsemide. Continue fluid restriction, monitoring of input and output. He is also   on carvedilol, digoxin, BiDil,  Entresto,eplerenone at home.  AKI on CKD stage III: Creatinine mildly bumped up to 1.27 today.  We will continue to monitor  A. Fib with RVR: Rate in the range of 100-110 this morning.  Currently on Xarelto.CHA2DS2-VAScof 6.   Continue Coreg.  Uncontrolled type 2 diabetes: Hemoglobin A1c of 8.2.  Continue sliding scale insulin here, long-acting insulin.  On metformin and insulin  at home.  Hypertension: Currently blood pressure stable.  Continue carvedilol, BiDil  Hyperlipidemia: Continue Crestor  H/O Schizophrenia: .  On Seroquel .Currently mood is stable.  Left lower extremity ulcer: Has shallow ulcer on the left leg.  Most likely has chronic venous stasis and exacerbated by lower extremity edema due to CHF.  Continue wound care.          DVT prophylaxis:Xarelto Code Status: Full Family Communication: None present at the bedside Disposition Plan: Home after optimization of CHF status as per HF team   Consultants: Heart failure  Procedures: None  Antimicrobials:  Anti-infectives (From admission, onward)   None      Subjective:  Patient seen and examined the bedside this morning.  As usual, he was sitting in the chair.  Denies any chest pain or shortness of breath and says that he is feeling better.  Heart rate slightly up today and was ranging in the 110s.  Looks comfortable.  Objective: Vitals:   02/18/18 0524 02/18/18 0741 02/18/18 0837 02/18/18 1134  BP: (!) 90/55 (!) 149/117  (!) 103/50  Pulse: (!) 52 (!) 128 (!) 115   Resp: 20 20    Temp: 98.3 F (36.8 C) (!) 97.5 F (36.4 C)  97.7 F (36.5 C)  TempSrc: Axillary Axillary  Oral  SpO2: 97% 100%  99%  Weight: 124.2 kg     Height:        Intake/Output Summary (Last 24 hours) at 02/18/2018 1352 Last data filed at 02/18/2018 1015 Gross per 24 hour  Intake 567.01 ml  Output 2915 ml  Net -2347.99 ml   Filed Weights   02/16/18 0424 02/17/18 0529 02/18/18 0524  Weight: 124.7 kg 125.7 kg 124.2 kg     Examination:   General exam: Not in distress, morbidly obese  HEENT:PERRL,Oral mucosa moist, Ear/Nose normal on gross exam Respiratory system: Bilateral decreased air entry on the bases Cardiovascular system: A. fib , no JVD, murmurs, rubs, gallops or clicks. Gastrointestinal system: Abdomen is nondistended, soft and nontender. No organomegaly or masses felt. Normal bowel sounds heard. Central nervous system: Alert and oriented. No focal neurological deficits. Extremities: 1-2+ lower extremity edema, no clubbing ,no cyanosis, distal peripheral pulses palpable.,  Compression stockings Skin: Shallow ulcer on the left lower extremity, compression stockings      Data Reviewed: I have personally reviewed following labs and imaging studies  CBC: Recent Labs  Lab 02/13/18 1007 02/14/18 0507  WBC 6.2 6.8  NEUTROABS 4.1  --   HGB 12.2* 11.4*  HCT 39.6 37.1*  MCV 93.2 93.0  PLT 247 294   Basic Metabolic Panel: Recent Labs  Lab 02/14/18 0507 02/14/18 1126 02/15/18 0429 02/16/18 0413 02/17/18 0514 02/18/18 0517  NA 140  --  141 137 138 133*  K 4.2  --  3.6 3.8 4.4 3.9  CL 103  --  107 106 108 101  CO2 27  --  27 25 25 24   GLUCOSE 142*  --  107* 172* 144* 205*  BUN 29*  --  22 16 14 18   CREATININE 1.63*  --  1.32* 1.05 1.09 1.27*  CALCIUM 9.7  --  9.7 9.6 10.0 10.2  MG  --  2.1 2.3 2.1  --   --    GFR: Estimated Creatinine Clearance: 81 mL/min (A) (by C-G formula based on SCr of 1.27 mg/dL (H)). Liver Function Tests: Recent Labs  Lab 02/13/18 1007 02/14/18 0507  AST 41 40  ALT 45* 38  ALKPHOS 101 93  BILITOT 1.0 0.6  PROT 7.7 7.1  ALBUMIN 3.7 3.5   No results for input(s): LIPASE, AMYLASE in the last 168 hours. No results for input(s): AMMONIA in the last 168 hours. Coagulation Profile: No results for input(s): INR, PROTIME in the last 168 hours. Cardiac Enzymes: Recent Labs  Lab 02/14/18 1126  TROPONINI 0.07*   BNP (last 3 results) No results for  input(s): PROBNP in the last 8760 hours. HbA1C: No results for input(s): HGBA1C in the last 72 hours. CBG: Recent Labs  Lab 02/17/18 1054 02/17/18 1700 02/17/18 2108 02/18/18 0721 02/18/18 1132  GLUCAP 173* 157* 144* 160* 174*   Lipid Profile: No results for input(s): CHOL, HDL, LDLCALC, TRIG, CHOLHDL, LDLDIRECT in the last 72 hours. Thyroid Function Tests: No results for input(s): TSH, T4TOTAL, FREET4, T3FREE, THYROIDAB in the last 72 hours. Anemia Panel: No results for input(s): VITAMINB12, FOLATE, FERRITIN, TIBC, IRON, RETICCTPCT in the last 72 hours. Sepsis Labs: No results for input(s): PROCALCITON, LATICACIDVEN in the last 168 hours.  Recent Results (from the past 240 hour(s))  MRSA PCR Screening     Status: None   Collection Time: 02/14/18 12:25 PM  Result Value Ref Range Status   MRSA by PCR NEGATIVE NEGATIVE Final  Comment:        The GeneXpert MRSA Assay (FDA approved for NASAL specimens only), is one component of a comprehensive MRSA colonization surveillance program. It is not intended to diagnose MRSA infection nor to guide or monitor treatment for MRSA infections. Performed at [facility], [place] [address], [place], [place] [address]          Radiology Studies: No results found.      Scheduled Meds: . allopurinol  100 mg Oral Daily  . digoxin  0.125 mg Oral Daily  . eplerenone  25 mg Oral QHS  . insulin aspart  0-20 Units Subcutaneous TID WC  . insulin glargine  20 Units Subcutaneous QHS  . isosorbide-hydrALAZINE  0.5 tablet Oral TID  . potassium chloride  40 mEq Oral Daily  . QUEtiapine  800 mg Oral QHS  . rivaroxaban  20 mg Oral Daily  . rosuvastatin  40 mg Oral Daily  . sacubitril-valsartan  1 tablet Oral BID  . sodium chloride flush  10-40 mL Intracatheter Q12H  . torsemide  40 mg Oral Daily  . torsemide  80 mg Oral Daily   Continuous Infusions:    LOS: 4 days    Time spent:25 mins. More than 50% of that time was spent  in counseling and/or coordination of care.      Shelly Coss, MD [facility] Pager [phone]  If 7PM-7AM, please contact night-coverage [email or web address] Password [email or web address] 02/18/2018, 1:52 PM

## 2018-02-19 DIAGNOSIS — R0602 Shortness of breath: Secondary | ICD-10-CM

## 2018-02-19 DIAGNOSIS — E785 Hyperlipidemia, unspecified: Secondary | ICD-10-CM

## 2018-02-19 DIAGNOSIS — N182 Chronic kidney disease, stage 2 (mild): Secondary | ICD-10-CM

## 2018-02-19 DIAGNOSIS — R7989 Other specified abnormal findings of blood chemistry: Secondary | ICD-10-CM

## 2018-02-19 DIAGNOSIS — I872 Venous insufficiency (chronic) (peripheral): Secondary | ICD-10-CM

## 2018-02-19 DIAGNOSIS — L97909 Non-pressure chronic ulcer of unspecified part of unspecified lower leg with unspecified severity: Secondary | ICD-10-CM

## 2018-02-19 DIAGNOSIS — E11622 Type 2 diabetes mellitus with other skin ulcer: Secondary | ICD-10-CM

## 2018-02-19 DIAGNOSIS — I5020 Unspecified systolic (congestive) heart failure: Secondary | ICD-10-CM

## 2018-02-19 DIAGNOSIS — E1169 Type 2 diabetes mellitus with other specified complication: Secondary | ICD-10-CM

## 2018-02-19 DIAGNOSIS — Z794 Long term (current) use of insulin: Secondary | ICD-10-CM

## 2018-02-19 DIAGNOSIS — I5043 Acute on chronic combined systolic (congestive) and diastolic (congestive) heart failure: Secondary | ICD-10-CM

## 2018-02-19 LAB — GLUCOSE, CAPILLARY
GLUCOSE-CAPILLARY: 211 mg/dL — AB (ref 70–99)
Glucose-Capillary: 203 mg/dL — ABNORMAL HIGH (ref 70–99)

## 2018-02-19 LAB — BASIC METABOLIC PANEL
Anion gap: 10 (ref 5–15)
BUN: 29 mg/dL — ABNORMAL HIGH (ref 8–23)
CO2: 26 mmol/L (ref 22–32)
Calcium: 10.4 mg/dL — ABNORMAL HIGH (ref 8.9–10.3)
Chloride: 98 mmol/L (ref 98–111)
Creatinine, Ser: 1.48 mg/dL — ABNORMAL HIGH (ref 0.61–1.24)
GFR calc Af Amer: 58 mL/min — ABNORMAL LOW (ref >60)
GFR calc non Af Amer: 50 mL/min — ABNORMAL LOW (ref >60)
Glucose, Bld: 228 mg/dL — ABNORMAL HIGH (ref 70–99)
Potassium: 3.7 mmol/L (ref 3.5–5.1)
Sodium: 134 mmol/L — ABNORMAL LOW (ref 135–145)

## 2018-02-19 LAB — COOXEMETRY PANEL
Carboxyhemoglobin: 1.6 % — ABNORMAL HIGH (ref 0.5–1.5)
Methemoglobin: 1.4 % (ref 0.0–1.5)
O2 Saturation: 54.7 %
Total hemoglobin: 13.6 g/dL (ref 12.0–16.0)

## 2018-02-19 MED ORDER — AMIODARONE HCL 200 MG PO TABS
200.0000 mg | ORAL_TABLET | Freq: Two times a day (BID) | ORAL | Status: DC
  Administered 2018-02-19: 200 mg via ORAL
  Filled 2018-02-19: qty 1

## 2018-02-19 MED ORDER — EPLERENONE 25 MG PO TABS: 25.0000 mg | ORAL_TABLET | Freq: Every day | ORAL | 0 refills | Status: DC

## 2018-02-19 MED ORDER — AMIODARONE HCL 200 MG PO TABS: ORAL_TABLET | ORAL | 0 refills | Status: DC

## 2018-02-19 MED ORDER — POTASSIUM CHLORIDE CRYS ER 20 MEQ PO TBCR: 20.0000 meq | EXTENDED_RELEASE_TABLET | Freq: Every day | ORAL | 0 refills | Status: DC

## 2018-02-19 MED ORDER — TORSEMIDE 20 MG PO TABS: ORAL_TABLET | ORAL | 0 refills | Status: DC

## 2018-02-19 MED ORDER — ISOSORB DINITRATE-HYDRALAZINE 20-37.5 MG PO TABS: 0.5000 | ORAL_TABLET | Freq: Three times a day (TID) | ORAL | 0 refills | Status: DC

## 2018-02-19 MED ORDER — DIGOXIN 125 MCG PO TABS: 0.1250 mg | ORAL_TABLET | Freq: Every day | ORAL | 0 refills | Status: DC

## 2018-02-19 MED ORDER — TORSEMIDE 20 MG PO TABS
80.0000 mg | ORAL_TABLET | Freq: Every day | ORAL | Status: DC
  Administered 2018-02-19: 80 mg via ORAL
  Filled 2018-02-19: qty 4

## 2018-02-19 NOTE — Progress Notes (Signed)
Patient ID: [name] [name], male   DOB: [birth year], 63 y.o.   MRN: [id number]     Advanced Heart Failure Rounding Note  PCP-Cardiologist: Dr Haroldine Laws  Subjective:    He is now off milrinone, co-ox 55%.  I/Os negative yesterday, now transitioned to po torsemide.  Creatinine up to 1.48.  Feels the best he has in a long time.  No dyspnea.    Having runs of suspected atrial tachycardia, rate 100s.   Echo 02/15/18: EF 20-25%, severe global HK, mild RV dysfunction  Objective:   Weight Range: 122.9 kg Body mass index is 36.75 kg/m.   Vital Signs:   Temp:  [97.5 F (36.4 C)-99.3 F (37.4 C)] 97.5 F (36.4 C) (01/25 0405) Pulse Rate:  [103-115] 110 (01/25 0405) Resp:  [17-32] 17 (01/25 0405) BP: (90-130)/(45-67) 90/57 (01/25 0405) SpO2:  [96 %-99 %] 97 % (01/25 0405) Weight:  [122.9 kg] 122.9 kg (01/25 0405) Last BM Date: 02/13/18  Weight change: Filed Weights   02/17/18 0529 02/18/18 0524 02/19/18 0405  Weight: 125.7 kg 124.2 kg 122.9 kg    Intake/Output:   Intake/Output Summary (Last 24 hours) at 02/19/2018 0832 Last data filed at 02/19/2018 0342 Gross per 24 hour  Intake 1418 ml  Output 2590 ml  Net -1172 ml      Physical Exam   General: NAD Neck: No JVD, no thyromegaly or thyroid nodule.  Lungs: Clear to auscultation bilaterally with normal respiratory effort. CV: Lateral PMI.  Heart mildly tachy, regular S1/S2, no S3/S4, no murmur.  No peripheral edema.   Abdomen: Soft, nontender, no hepatosplenomegaly, no distention.  Skin: Intact without lesions or rashes.  Neurologic: Alert and oriented x 3.  Psych: Normal affect. Extremities: No clubbing or cyanosis.  HEENT: Normal.    Telemetry   NSR with runs of atrial tachycardia (personally reviewed)  Labs    CBC No results for input(s): WBC, NEUTROABS, HGB, HCT, MCV, PLT in the last 72 hours. Basic Metabolic Panel Recent Labs    02/18/18 0517 02/19/18 0500  NA 133* 134*  K 3.9 3.7  CL 101 98  CO2  24 26  GLUCOSE 205* 228*  BUN 18 29*  CREATININE 1.27* 1.48*  CALCIUM 10.2 10.4*   Liver Function Tests No results for input(s): AST, ALT, ALKPHOS, BILITOT, PROT, ALBUMIN in the last 72 hours. No results for input(s): LIPASE, AMYLASE in the last 72 hours. Cardiac Enzymes No results for input(s): CKTOTAL, CKMB, CKMBINDEX, TROPONINI in the last 72 hours.  BNP: BNP (last 3 results) Recent Labs    02/08/18 0029 02/13/18 1007 02/14/18 0507  BNP 243.1* 423.4* 354.1*    ProBNP (last 3 results) No results for input(s): PROBNP in the last 8760 hours.   D-Dimer No results for input(s): DDIMER in the last 72 hours. Hemoglobin A1C No results for input(s): HGBA1C in the last 72 hours. Fasting Lipid Panel No results for input(s): CHOL, HDL, LDLCALC, TRIG, CHOLHDL, LDLDIRECT in the last 72 hours. Thyroid Function Tests No results for input(s): TSH, T4TOTAL, T3FREE, THYROIDAB in the last 72 hours.  Invalid input(s): FREET3  Other results:   Imaging    No results found.   Medications:     Scheduled Medications: . allopurinol  100 mg Oral Daily  . amiodarone  200 mg Oral BID  . digoxin  0.125 mg Oral Daily  . eplerenone  25 mg Oral QHS  . insulin aspart  0-20 Units Subcutaneous TID WC  . insulin glargine  20 Units  Subcutaneous QHS  . isosorbide-hydrALAZINE  0.5 tablet Oral TID  . potassium chloride  40 mEq Oral Daily  . QUEtiapine  800 mg Oral QHS  . rivaroxaban  20 mg Oral Daily  . rosuvastatin  40 mg Oral Daily  . sacubitril-valsartan  1 tablet Oral BID  . sodium chloride flush  10-40 mL Intracatheter Q12H  . torsemide  40 mg Oral Daily  . [START ON 02/20/2018] torsemide  80 mg Oral Daily    Infusions:   PRN Medications: acetaminophen, sodium chloride flush, sorbitol    Patient Profile   [name] [name] is a 63 y.o. 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 Had history of acute renal failure in setting of rhabdo requiring short term HD.   Presented to [facility] on 02/13/18 with SOB. He has had frequent admissions for volume overload.   Assessment/Plan   1) Acute on chronic combined systolic/diastolic HF: Nonischemic cardiomyopathy.  EF 40-45%, grade II DD (10/2012) now decreased to 20-25% (05/2015) RV severely reduced. Echo 4/19 EF 30-35% RV ok. Had [place] 2014 with minimal CAD. Holter showed <3% PVC burden.  Echo 02/15/18 with EF 20-25%, severe global HK, mild RV dysfunction.  Low output HF, co-ox initially 39% and started on milrinone with diuresis.  Now well-diuresed, co-ox 55% today off milrinone.  Creatinine up to 1.48.  He does not look volume overloaded.  - Hold am torsemide, will have him on torsemide 80 qam/40 qpm for home.  - Continue eplerenone 25 mg daily (breast tenderness with spiro) - Will stay off Coreg with low output -Continue Entresto 24/26 mg BID.  - Continue bidil 1/2 tab TID.  - Continue digoxin 0.125 mg daily. Dig level <0.2 1/19. - Refuses HF paramedicine.  - Not a candidate for home milrinone or advanced therapies.  Not ICD candidate with chronic LE wounds.  2) Acute hypoxic respiratory failure: Now on RA. No change.  3) PAF: Currently, NSR with runs of atrial tachycardia.  - Continue Xarelto 20 mg daily.Denies any missed doses 4) CKD stage III: Creatinine baseline 1.3-1.5.  Initially creatinine went down with milrinone, back up to 1.48 off milrinone and with diuresis.   - As above, hold am dose of torsemide.  5) ?OSA:Has been ordered for sleep study multiple times but not attended. Would benefit from home sleep study once available. No change.  6) DMII:Per primary. No change.   7) Medication Noncompliance: Refuses HF paramedicine. No change.  8) Hyperlipidemia - Continuecrestor. No change.  9) CVA: Noted 09/2017,  likely related to atrial fibrillation.  - Continue Xarelto. No change.  10) Hx of ETOH and cocaine abuse: Denies recent use. No change.  11) LLE wound: Chronic venous stasis wounds. WOC consulted and gave recommendations. Per primary. ABIs 02/14/18 normal.  12) NSVT 13) Elevated troponin: Suspect demand ischemia. LHC in 2014 with minimal CAD.  No chest pain.  14) Atrial tachycardia: Frequent runs of suspected AT noted.  - Start amiodarone 200 mg bid.  Continue x 2 wks then decrease to 200 mg daily.   Patient is likely stable for home today.  He is tenuous with low output HF but not candidate for advanced therapies or home milrinone.  Would send home on amiodarone 200 mg bid x 2 wks then 200 daily, Xarelto, torsemide 80 qam/40 qpm, eplerenone 25 daily, Entresto 24/26 bid,  Bidil 1/2 tab tid, Crestor 40 daily, digoxin 0.125 daily, KCl 20 daily. Will need close CHF clinic followup.   Length of Stay: Rol[place], MD  02/19/2018, 8:32 AM  Advanced Heart Failure Team Pager [phone] (M-F; 7a - 4p)  Please contact [facility] for night-coverage after hours (4p -7a ) and weekends on [email or web address]

## 2018-02-19 NOTE — Progress Notes (Signed)
Placed resumption of care for [facility] services order, notified [facility] that patient will DC today. No other CM needs

## 2018-02-19 NOTE — Discharge Summary (Signed)
Physician Discharge Summary  [name] [name] LNL:[id number] DOB: Dec 10, 1955 DOA: 02/13/2018  PCP: [name], MD  Admit date: 02/13/2018 Discharge date: 02/19/2018  Time spent: 45 minutes  Recommendations for Outpatient Follow-up:  Patient will be discharged to home.  Patient will need to follow up with primary care provider within one week of discharge, repeat BMP.  Follow up with CHF clinic. Patient should continue medications as prescribed.  Patient should follow a heart healthy/carb modified diet.   Discharge Diagnoses:  Principal Problem:   Acute on chronic systolic CHF (congestive heart failure) (HCC) Active Problems: Chronic kidney disease, stage III Paroxysmal atrial fibrillation with RVR/atrial tachycardia Chronically Elevated troponin Diabetes mellitus, type II Essential hypertension Hyperlipidemia History of schizophrenia Left lower extremity ulcer  Discharge Condition: stable  Diet recommendation: heart healhy/carb modified   Filed Weights   02/17/18 0529 02/18/18 0524 02/19/18 0405  Weight: 125.7 kg 124.2 kg 122.9 kg    History of present illness:  On 02/13/2018 by Dr. Janifer Adie is a 63 y.o. male with medical history significant for congestive heart failure chronic stasis dermatitis, diabetes mellitus morbid obesity, atrial fibrillation on anticoagulation with Xarelto, who presents with acute onset of shortness of breath that started this morning which is worsened when he lies down and better when he sits up associated with nonproductive cough without fever or chills admits to increased pedal edema stated that he is compliant with his CHF medications, he was just discharged from the hospital 2 days ago for CHF he called EMS because of worsening shortness of breath.  He denies any chest pain or pressure no abdominal pain no nausea vomiting.   Hospital Course:  Acute systolic CHF exacerbation -Presented with shortness of breath.  BNP 354 -Echocardiogram 02/15/2018 showed an EF of 20 to 25%, global hypokinesis, mild RV dysfunction -Cardiology/CHF team consulted and appreciated -Patient was started on milrinone with diuresis and transition to oral torsemide -States he is feeling much more improved today.  Denies any chest pain or shortness of breath. -Cardiology recommending torsemide 80 mg qam/40 mg qpm, eplerenone 25 mg daily, Entresto 24/26 mg BID, BiDil 1/2 tablet TID, digoxin 0.125 mg daily, KCl 14mq daily -Patient refuses HF para medicine.  He is not a candidate for home milrinone or advanced therapies.  None ICD candidate with chronic lower extremity wounds. -Patient will need to follow-up with the CHF clinic.  Chronic kidney disease, stage III -Patient presented with a creatinine of 1.55,, upon review of patient's chart his creatinine has ranged from 1.2-1.5 -Currently creatinine 1.48 -That he will be on diuretics as well as several other potentially nephrotoxic agents, would repeat BMP in 1 to 2 weeks  Paroxysmal atrial fibrillation with RVR/atrial tachycardia -Currently in normal sinus rhythm -Continue Xarelto -Cardiology recommending to start amiodarone 200 mg BID x 2 weeks, then decrease to '200mg'$  daily  Chronically Elevated troponin -Suspect secondary to demand ischemia -Trop 0.07 (lower than usual) -Currently no chest pain  Diabetes mellitus, type II -Hemoglobin A1c 8.2 -Continue metformin, Lantus, Humalog, Ozempic  Essential hypertension -Continue medications as listed above  Hyperlipidemia -Continue statin  History of schizophrenia -Continue Seroquel -Appears to be stable  Left lower extremity ulcer -Most likely chronic venous stasis and exacerbated by edema due to CHF -ABI normal -Wound care was consulted -present on admission  Procedures: ABI Echocardiogram  Consultations: Audiology/CHF  Discharge Exam: Vitals:   02/19/18 0029 02/19/18 0405  BP: (!) 106/45 (!) 90/57    Pulse: (!) 103 (!) 110  Resp: (!) 32 17  Temp:  (!) 97.5 F (36.4 C)  SpO2:  97%   Patient feeling much better today.  Feels better than he has in several weeks.  Denies current chest pain, shortness breath, abdominal pain, nausea or vomiting, diarrhea or constipation, dizziness or headache.  Eager to go home.   General: Well developed, well nourished, NAD, appears stated age  61: NCAT, mucous membranes moist.  Neck: Suppe  Cardiovascular: S1 S2 auscultated, regular, tachycardic, no murmur  Respiratory: Clear to auscultation bilaterally with equal chest rise  Abdomen: Soft, nontender, nondistended, + bowel sounds  Extremities: warm dry without cyanosis clubbing or edema  Neuro: AAOx3, nonfocal  Psych: Pleasant, appropriate mood and affect  Discharge Instructions Discharge Instructions    Discharge instructions   Complete by:  As directed    Patient will be discharged to home.  Patient will need to follow up with primary care provider within one week of discharge, repeat BMP.  Follow up with CHF clinic. Patient should continue medications as prescribed.  Patient should follow a heart healthy/carb modified diet.     Allergies as of 02/19/2018      Reactions   Heparin Other (See Comments)   Documented HIT under problem list Pt states he's not allergic. " They told me not take it anymore"   Spironolactone Other (See Comments)   Breast tenderness   Thorazine [chlorpromazine Hcl] Other (See Comments)   Body freezes up      Medication List    STOP taking these medications   carvedilol 3.125 MG tablet Commonly known as:  COREG     TAKE these medications   ACCU-CHEK FASTCLIX LANCETS Misc Check blood sugar 4 times a day before meals and bedtime   ACCU-CHEK GUIDE w/Device Kit 1 each by Does not apply route 4 (four) times daily.   allopurinol 100 MG tablet Commonly known as:  ZYLOPRIM Take 1 tablet (100 mg total) by mouth daily.   amiodarone 200 MG  tablet Commonly known as:  PACERONE Take 1 tablet twice daily through 03/04/2018. On 03/05/2018, take only 1 tablet daily.   B-D UF III MINI PEN NEEDLES 31G X 5 MM Misc Generic drug:  Insulin Pen Needle Use four times daily as directed. Dx code E11.00, Z79.4   digoxin 0.125 MG tablet Commonly known as:  LANOXIN Take 1 tablet (0.125 mg total) by mouth daily.   ENTRESTO 24-26 MG Generic drug:  sacubitril-valsartan Take 1 tablet by mouth 2 (two) times daily.   eplerenone 25 MG tablet Commonly known as:  INSPRA Take 1 tablet (25 mg total) by mouth at bedtime.   glucose blood test strip Commonly known as:  ACCU-CHEK GUIDE Check blood sugar 4 times a day before meal and bedtime   insulin glargine 100 UNIT/ML injection Commonly known as:  LANTUS Inject 0.2 mLs (20 Units total) into the skin at bedtime for 30 days.   insulin lispro 100 UNIT/ML injection Commonly known as:  HUMALOG Inject 15 Units into the skin 3 (three) times daily before meals.   isosorbide-hydrALAZINE 20-37.5 MG tablet Commonly known as:  BIDIL Take 0.5 tablets by mouth 3 (three) times daily. What changed:  how much to take   metFORMIN 500 MG 24 hr tablet Commonly known as:  GLUCOPHAGE-XR Take 500 mg by mouth daily.   OZEMPIC (0.25 OR 0.5 MG/DOSE) 2 MG/1.5ML Sopn Generic drug:  Semaglutide(0.25 or 0.'5MG'$ /DOS) Inject 0.25 mg into the skin once a week.   potassium chloride SA 20  MEQ tablet Commonly known as:  K-DUR,KLOR-CON Take 1 tablet (20 mEq total) by mouth daily. Start taking on:  February 20, 2018   QUEtiapine 400 MG tablet Commonly known as:  SEROQUEL Take 1 tablet (400 mg total) by mouth 2 (two) times daily for 21 days. What changed:    how much to take  when to take this   rosuvastatin 40 MG tablet Commonly known as:  CRESTOR Take 1 tablet (40 mg total) by mouth daily.   torsemide 20 MG tablet Commonly known as:  DEMADEX Take '80mg'$  (4 tablets) each morning. Take '40mg'$  (2 tablets) each  evening. What changed:    how much to take  how to take this  when to take this  additional instructions   XARELTO 20 MG Tabs tablet Generic drug:  rivaroxaban TAKE 1 TABLET(20 MG) BY MOUTH DAILY What changed:  See the new instructions.      Allergies  Allergen Reactions  . Heparin Other (See Comments)    Documented HIT under problem list Pt states he's not allergic. " They told me not take it anymore"  . Spironolactone Other (See Comments)    Breast tenderness  . Thorazine [Chlorpromazine Hcl] Other (See Comments)    Body freezes up   Follow-up Information    [name], MD. Schedule an appointment as soon as possible for a visit in 1 week(s).   Specialty:  Internal Medicine Why:  Hospital follow upw Contact information: [address] [place] [address] [phone]        Bensimhon, Shaune Pascal, MD. Schedule an appointment as soon as possible for a visit in 1 week(s).   Specialty:  Cardiology Why:  Hospital follow up Contact information: [address] [place] [address] [phone]            The results of significant diagnostics from this hospitalization (including imaging, microbiology, ancillary and laboratory) are listed below for reference.    Significant Diagnostic Studies: Dg Chest 2 View  Result Date: 02/08/2018 CLINICAL DATA:  63 y/o M; chest pain, shortness of breath, history of congestive heart failure. EXAM: CHEST - 2 VIEW COMPARISON:  02/03/2018 chest radiograph and CT chest. FINDINGS: Stable cardiomegaly. Aortic atherosclerosis with calcification. Stable diffuse reticular opacities of the lungs. No focal consolidation. No pleural effusion or pneumothorax. No acute osseous abnormality is evident. IMPRESSION: Stable cardiomegaly. Reticular opacities similar to prior studies, likely interstitial edema. Electronically Signed   By: Kristine Garbe M.D.   On: 02/08/2018 01:00   Dg Chest 2  View  Result Date: 02/03/2018 CLINICAL DATA:  Flu-like symptoms.  Shortness of breath. EXAM: CHEST - 2 VIEW COMPARISON:  Chest x-ray 12/27/2017. FINDINGS: Cardiomegaly with diffuse bilateral from interstitial prominence consistent with CHF. Pneumonitis can not be excluded. Improved aeration of both lungs noted from 12/27/2017. No pleural effusion or pneumothorax. IMPRESSION: Cardiomegaly with diffuse bilateral from interstitial prominence suggesting CHF. Pneumonitis can not be excluded. Improved aeration of both lungs noted from 12/27/2017. Electronically Signed   By: Marcello Moores  Register   On: 02/03/2018 10:44   Ct Chest Wo Contrast  Result Date: 02/03/2018 CLINICAL DATA:  Flu like symptoms for 4 days. Exertional dyspnea. Productive cough. EXAM: CT CHEST WITHOUT CONTRAST TECHNIQUE: Multidetector CT imaging of the chest was performed following the standard protocol without IV contrast. COMPARISON:  Chest radiograph 02/03/2018 FINDINGS: Cardiovascular: Enlarged cardiac silhouette. Small pericardial effusion. Normal contour of the great vessels. Minimal calcific atherosclerotic disease of the coronary arteries.  Mediastinum/Nodes: Mild right-sided mediastinal lymphadenopathy with the largest paratracheal lymph node measuring 13 mm in short axis. Normal appearance of the thyroid gland, esophagus and trachea. Lungs/Pleura: No evidence of lobar consolidation, pleural effusion or pneumothorax. Minimal interstitial pulmonary edema. Linear atelectatic changes in the right middle lobe and lingula. Upper Abdomen: Numerous cystic lesions and rim calcified lesions throughout the spleen with benign appearance. Musculoskeletal: No chest wall mass or suspicious bone lesions identified. 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 IMPRESSION: No evidence of lobar consolidation. Minimal interstitial pulmonary edema. Cardiomegaly with small pericardial effusion. Mild right-sided mediastinal  lymphadenopathy, etiology uncertain. Follow-up in 6-12 months may be considered. Electronically Signed   By: Fidela Salisbury M.D.   On: 02/03/2018 11:52   Dg Chest Port 1 View  Result Date: 02/13/2018 CLINICAL DATA:  Shortness of breath.  Dizziness. EXAM: PORTABLE CHEST 1 VIEW COMPARISON:  February 08, 2018 FINDINGS: Stable cardiomegaly. The mediastinum is unchanged since September 2019 consistent with a tortuous thoracic aorta. No pneumothorax. No nodules or masses. No focal infiltrates. No overt edema. IMPRESSION: Limited study due to the low volume portable technique. However, no acute abnormalities are seen. Electronically Signed   By: Dorise Bullion III M.D   On: 02/13/2018 10:17   Vas [place] Abi With/wo Tbi  Result Date: 02/14/2018 LOWER EXTREMITY DOPPLER STUDY Indications: Ulceration, and peripheral artery disease.  Performing Technologist: Abram Sander RVS  Examination Guidelines: A complete evaluation includes at minimum, Doppler waveform signals and systolic blood pressure reading at the level of bilateral brachial, anterior tibial, and posterior tibial arteries, when vessel segments are accessible. Bilateral testing is considered an integral part of a complete examination. Photoelectric Plethysmograph (PPG) waveforms and toe systolic pressure readings are included as required and additional duplex testing as needed. Limited examinations for reoccurring indications may be performed as noted.  ABI Findings: +--------+------------------+-----+---------+----------------------------------+ Right   Rt Pressure (mmHg)IndexWaveform Comment                            +--------+------------------+-----+---------+----------------------------------+ Brachial                       triphasicunable to do pressure due to IV                                            location                           +--------+------------------+-----+---------+----------------------------------+ PTA     133                1.07 triphasic                                   +--------+------------------+-----+---------+----------------------------------+ DP      135               1.09 triphasic                                   +--------+------------------+-----+---------+----------------------------------+ +--------+------------------+-----+---------+-------+ Left    Lt Pressure (mmHg)IndexWaveform Comment +--------+------------------+-----+---------+-------+ [id number]  triphasic        +--------+------------------+-----+---------+-------+ PTA     143               1.15 triphasic        +--------+------------------+-----+---------+-------+ DP      154               1.24 triphasic        +--------+------------------+-----+---------+-------+ +-------+-----------+-----------+------------+------------+ ABI/TBIToday's ABIToday's TBIPrevious ABIPrevious TBI +-------+-----------+-----------+------------+------------+ Right  1.09                                           +-------+-----------+-----------+------------+------------+ Left   1.24                                           +-------+-----------+-----------+------------+------------+  Summary: Right: Resting right ankle-brachial index is within normal range. No evidence of significant right lower extremity arterial disease. Left: Resting left ankle-brachial index is within normal range. No evidence of significant left lower extremity arterial disease.  *See table(s) above for measurements and observations.  Electronically signed by Ruta Hinds MD on 02/14/2018 at 5:28:30 PM.   Final    [place] Ekg Site Rite  Result Date: 02/14/2018 If Site Rite image not attached, placement could not be confirmed due to current cardiac rhythm.   Microbiology: Recent Results (from the past 240 hour(s))  MRSA PCR Screening     Status: None   Collection Time: 02/14/18 12:25 PM  Result Value Ref Range Status   MRSA  by PCR NEGATIVE NEGATIVE Final    Comment:        The GeneXpert MRSA Assay (FDA approved for NASAL specimens only), is one component of a comprehensive MRSA colonization surveillance program. It is not intended to diagnose MRSA infection nor to guide or monitor treatment for MRSA infections. Performed at [facility], [place] [address] [place], [place] [address]      Labs: Basic Metabolic Panel: Recent Labs  Lab 02/14/18 1126 02/15/18 0429 02/16/18 0413 02/17/18 0514 02/18/18 0517 02/19/18 0500  NA  --  141 137 138 133* 134*  K  --  3.6 3.8 4.4 3.9 3.7  CL  --  107 106 108 101 98  CO2  --  '27 25 25 24 26  '$ GLUCOSE  --  107* 172* 144* 205* 228*  BUN  --  '22 16 14 18 '$ 29*  CREATININE  --  1.32* 1.05 1.09 1.27* 1.48*  CALCIUM  --  9.7 9.6 10.0 10.2 10.4*  MG 2.1 2.3 2.1  --   --   --    Liver Function Tests: Recent Labs  Lab 02/13/18 1007 02/14/18 0507  AST 41 40  ALT 45* 38  ALKPHOS 101 93  BILITOT 1.0 0.6  PROT 7.7 7.1  ALBUMIN 3.7 3.5   No results for input(s): LIPASE, AMYLASE in the last 168 hours. No results for input(s): AMMONIA in the last 168 hours. CBC: Recent Labs  Lab 02/13/18 1007 02/14/18 0507  WBC 6.2 6.8  NEUTROABS 4.1  --   HGB 12.2* 11.4*  HCT 39.6 37.1*  MCV 93.2 93.0  PLT 247 221   Cardiac Enzymes: Recent Labs  Lab 02/14/18 1126  TROPONINI 0.07*   BNP: BNP (last 3 results) Recent Labs  02/08/18 0029 02/13/18 1007 02/14/18 0507  BNP 243.1* 423.4* 354.1*    ProBNP (last 3 results) No results for input(s): PROBNP in the last 8760 hours.  CBG: Recent Labs  Lab 02/18/18 0721 02/18/18 1132 02/18/18 1642 02/18/18 2136 02/19/18 0740  GLUCAP 160* 174* 166* 160* 203*       Signed:  Davaun Quintela  [facility] 02/19/2018, 10:47 AM

## 2018-03-07 ENCOUNTER — Encounter (HOSPITAL_COMMUNITY): Payer: Self-pay

## 2018-03-17 ENCOUNTER — Encounter (HOSPITAL_COMMUNITY): Payer: Self-pay

## 2018-03-28 ENCOUNTER — Other Ambulatory Visit: Payer: Self-pay

## 2018-03-28 ENCOUNTER — Ambulatory Visit (HOSPITAL_COMMUNITY)
Admission: RE | Admit: 2018-03-28 | Discharge: 2018-03-28 | Disposition: A | Discharge status: Home / Self Care | Payer: Medicare Other | Source: Ambulatory Visit | Attending: Internal Medicine | Admitting: Internal Medicine

## 2018-03-28 VITALS — BP 124/90 | HR 71 | Wt 272.0 lb

## 2018-03-28 DIAGNOSIS — Z794 Long term (current) use of insulin: Secondary | ICD-10-CM | POA: Diagnosis not present

## 2018-03-28 DIAGNOSIS — I2510 Atherosclerotic heart disease of native coronary artery without angina pectoris: Secondary | ICD-10-CM | POA: Diagnosis not present

## 2018-03-28 DIAGNOSIS — I10 Essential (primary) hypertension: Secondary | ICD-10-CM | POA: Diagnosis not present

## 2018-03-28 DIAGNOSIS — E1169 Type 2 diabetes mellitus with other specified complication: Secondary | ICD-10-CM

## 2018-03-28 DIAGNOSIS — Z79899 Other long term (current) drug therapy: Secondary | ICD-10-CM | POA: Insufficient documentation

## 2018-03-28 DIAGNOSIS — E1122 Type 2 diabetes mellitus with diabetic chronic kidney disease: Secondary | ICD-10-CM | POA: Diagnosis not present

## 2018-03-28 DIAGNOSIS — F1011 Alcohol abuse, in remission: Secondary | ICD-10-CM | POA: Insufficient documentation

## 2018-03-28 DIAGNOSIS — I480 Paroxysmal atrial fibrillation: Secondary | ICD-10-CM | POA: Diagnosis not present

## 2018-03-28 DIAGNOSIS — F1411 Cocaine abuse, in remission: Secondary | ICD-10-CM | POA: Insufficient documentation

## 2018-03-28 DIAGNOSIS — Z888 Allergy status to other drugs, medicaments and biological substances status: Secondary | ICD-10-CM | POA: Insufficient documentation

## 2018-03-28 DIAGNOSIS — N183 Chronic kidney disease, stage 3 (moderate): Secondary | ICD-10-CM | POA: Diagnosis not present

## 2018-03-28 DIAGNOSIS — I639 Cerebral infarction, unspecified: Secondary | ICD-10-CM

## 2018-03-28 DIAGNOSIS — Z884 Allergy status to anesthetic agent status: Secondary | ICD-10-CM | POA: Insufficient documentation

## 2018-03-28 DIAGNOSIS — S81802A Unspecified open wound, left lower leg, initial encounter: Secondary | ICD-10-CM | POA: Insufficient documentation

## 2018-03-28 DIAGNOSIS — Z8673 Personal history of transient ischemic attack (TIA), and cerebral infarction without residual deficits: Secondary | ICD-10-CM | POA: Diagnosis not present

## 2018-03-28 DIAGNOSIS — I130 Hypertensive heart and chronic kidney disease with heart failure and stage 1 through stage 4 chronic kidney disease, or unspecified chronic kidney disease: Secondary | ICD-10-CM | POA: Insufficient documentation

## 2018-03-28 DIAGNOSIS — Z9114 Patient's other noncompliance with medication regimen: Secondary | ICD-10-CM | POA: Diagnosis not present

## 2018-03-28 DIAGNOSIS — Z7901 Long term (current) use of anticoagulants: Secondary | ICD-10-CM | POA: Diagnosis not present

## 2018-03-28 DIAGNOSIS — I428 Other cardiomyopathies: Secondary | ICD-10-CM | POA: Insufficient documentation

## 2018-03-28 DIAGNOSIS — M549 Dorsalgia, unspecified: Secondary | ICD-10-CM | POA: Diagnosis not present

## 2018-03-28 DIAGNOSIS — E785 Hyperlipidemia, unspecified: Secondary | ICD-10-CM | POA: Diagnosis not present

## 2018-03-28 DIAGNOSIS — G8929 Other chronic pain: Secondary | ICD-10-CM

## 2018-03-28 DIAGNOSIS — I5042 Chronic combined systolic (congestive) and diastolic (congestive) heart failure: Secondary | ICD-10-CM | POA: Diagnosis not present

## 2018-03-28 DIAGNOSIS — R0683 Snoring: Secondary | ICD-10-CM

## 2018-03-28 DIAGNOSIS — Z87891 Personal history of nicotine dependence: Secondary | ICD-10-CM | POA: Diagnosis not present

## 2018-03-28 DIAGNOSIS — Z8546 Personal history of malignant neoplasm of prostate: Secondary | ICD-10-CM | POA: Insufficient documentation

## 2018-03-28 DIAGNOSIS — N182 Chronic kidney disease, stage 2 (mild): Secondary | ICD-10-CM

## 2018-03-28 LAB — CBC
HCT: 45.4 % (ref 39.0–52.0)
Hemoglobin: 14.2 g/dL (ref 13.0–17.0)
MCH: 27.8 pg (ref 26.0–34.0)
MCHC: 31.3 g/dL (ref 30.0–36.0)
MCV: 89 fL (ref 80.0–100.0)
NRBC: 0 % (ref 0.0–0.2)
Platelets: 165 10*3/uL (ref 150–400)
RBC: 5.1 MIL/uL (ref 4.22–5.81)
RDW: 16.2 % — ABNORMAL HIGH (ref 11.5–15.5)
WBC: 5.1 10*3/uL (ref 4.0–10.5)

## 2018-03-28 LAB — COMPREHENSIVE METABOLIC PANEL
ALT: 20 U/L (ref 0–44)
AST: 29 U/L (ref 15–41)
Albumin: 4.1 g/dL (ref 3.5–5.0)
Alkaline Phosphatase: 90 U/L (ref 38–126)
Anion gap: 10 (ref 5–15)
BUN: 15 mg/dL (ref 8–23)
CO2: 24 mmol/L (ref 22–32)
Calcium: 9.6 mg/dL (ref 8.9–10.3)
Chloride: 104 mmol/L (ref 98–111)
Creatinine, Ser: 1.22 mg/dL (ref 0.61–1.24)
GFR calc non Af Amer: >60 mL/min (ref >60)
Glucose, Bld: 110 mg/dL — ABNORMAL HIGH (ref 70–99)
Potassium: 3.7 mmol/L (ref 3.5–5.1)
SODIUM: 138 mmol/L (ref 135–145)
Total Bilirubin: 1 mg/dL (ref 0.3–1.2)
Total Protein: 7.8 g/dL (ref 6.5–8.1)

## 2018-03-28 LAB — TSH: TSH: 5.146 u[IU]/mL — ABNORMAL HIGH (ref 0.350–4.500)

## 2018-03-28 LAB — T4, FREE: Free T4: 0.8 ng/dL — ABNORMAL LOW (ref 0.82–1.77)

## 2018-03-28 MED ORDER — ISOSORB DINITRATE-HYDRALAZINE 20-37.5 MG PO TABS: 1.0000 | ORAL_TABLET | Freq: Three times a day (TID) | ORAL | 3 refills | Status: DC

## 2018-03-28 NOTE — Progress Notes (Signed)
Patient ID: [name] [name], male   DOB: 09-10-1955, 63 y.o.   MRN: [id number]    Advanced Heart Failure Clinic Note   Primary Care: Internal Medicine clinic, Dr. Silverio Decamp Primary Cardiologist: Dr. Doylene Canard  GI: Dr Ardis Hughs.  Primary HF: Dr. Haroldine Laws   HPI: [name] [name] is a 63 y.o. 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 Had history of acute renal failure in setting of rhabdo requiring short term HD.     Please see previous progress note from 08/04/17 for information regarding admissions prior to 07/2017  Admitted 8/13 - 8/19 with A/C CHF. Diuresed with IV lasix and metolazone for a total of 26 lbs down. Restarted on Entresto and Torsemide 80 mg daily for home. Discharge weight 282 lbs.   Admitted 9/27 - 9/29 after fall secondary to cocaine and ETOH binge. MRI showed acute CVA in setting of not taking his Xarelto.   Admitted 1/19 - 1/25 with low output CHF. Was transiently on milrinone, but weaned off prior to discharge.   He presents today for post hospital follow up. He is feeling much better since leaving the hospital. He states he used to only be active for about 5 minutes before having to take a break, but now gets about 20 minutes. He denies SOB walking about > a city block. Denies orthopnea, PND, CP, lightheadedness or dizziness. Denies bleeding on Xarelto, and remains compliant. Using a new pharmacy that helps him with pill boxes. Denies palpitations. Watching salt and fluid intake.   ECHO 10/27/12 EF 40-45%  ECHO 06/04/15 LVEF 20-25%, decreased diastolic compliance, LAE, RV severely reduced.  ECHO 11/26/15 EF 30%  Echo 12/26/15 EF 30-35% RV ok.  ECHO 05/31/17 EF 25-30% Echo 09/08/17: EF 25-30%  RHC 08/2015 RA = 7 RV = 35/10 PA = 37/15 (25) PCW =  10 Fick cardiac output/index = 4.3/1.7 PVR = 3.5 WU Ao sat = 95% PA sat = 58%, 58% Assessment: 1. Volume status looks good. 2. Cardiac output depressed  2014 LHC -  Left main: Normal LAD: Large vessel gives of 2 diagonals. Normal LCX: Very large vessel with 3 OMs. 20-30% plaque in midsection.  RCA: Dominant. Normal LV-gram done in the RAO projection: Ejection fraction = 35-40% global HK Assessment: 1. Minimal CAD  Labs 02/12/13 K 4.3 Creatinine 0.98          08/2013: K 3.9, creatinine 1.27, Mag 1.8, pro-BNP 392, Troponin 0.05          06/07/15: K 4.2, creatinine 1.21          07/10/2015: K 3.9 Creatinine 1.16           09/03/2015: K 4.7 Creatinine 1.32          09/11/2015: K 4.1 Creatinine 1.29   SH: Quit smoking 9 years ago. Lives alone. Quit ETOH 10-14-2013 FH: Mom died from MI in her early 14s  Brother MI and deceased. Does not know about fathers health history.   Review of systems complete and found to be negative unless listed in HPI.    Past Medical History:  Diagnosis Date  . Atrial fibrillation ([place])   . Cancer [facility])    Prostate cancer-bx. 3 weeks ago  . Cataract   . Chronic combined systolic and diastolic CHF (congestive heart failure) ([place])  a) EF 40-45% per 2D echo (02/2012) with grade 1 DD b)  NICM c) RHC (04/2012): RA: 4, RV 45/3/4, PA 42/9 (24), PCWP 14, Fick CO/CI: 5.2 /2.2, PVR 1.9 WU, PA 62% and 64% d) ECHO (10/2012) EF 40-45%, grade II DD, RV nl  . CKD (chronic kidney disease) stage 2, GFR 60-89 ml/min    BL SCr approximately 1-1.3  . Colitis 05/2009   History of colitis of ascending colon noted on CT abd/pelvis (05/2009), with interval resolution with subsequent CT  . Continuous chronic alcoholism ([place])   . Degenerative lumbar spinal stenosis    s/p L2-3, L3-4, L4-5 laminectomy partial facetectomy, and bilateral foraminotomy  . Diabetes mellitus without complication ([place])    Type II  . Dysrhythmia    A. Fib  . Family history of adverse reaction to  anesthesia    "sister, can't go to sleep"  . GERD (gastroesophageal reflux disease)   . H/O cocaine abuse ([place])   . Hepatic steatosis    suspected 2/2 alcohol abuse  . Hepatitis C   . History of pancreatitis 01/2011   Admission for acute pancreatitis presumed 2/2 ongoing alcohol abuse- and hypertriglyceridemia  . History of pneumonia   . HIT (heparin-induced thrombocytopenia) ([place])   . Hyperlipidemia   . Hypertension    uncontrolled with medication noncompliance  . Hypertriglyceridemia   . NICM (nonischemic cardiomyopathy) ([place])    a. LHC (04/2012): nl arteries  . PFO (patent foramen ovale) 01/2012   with right to left shunt, noted per TEE in evaluation for source of embolic stroke in 06/2861  . Rhabdomyolysis 02/22/2012   H/O rhabdomyolysis in 01/2012 that was idiopathic, cause never identified  . Schizophrenia, schizo-affective ([place])   . Shortness of breath dyspnea   . Splenic cyst   . Stroke ([place]) 01/2012   Small cerebellar infarcts right greater than left as well as questionable acute left external capsule and caudate nuclear punctate lacunar infarcts noted per MRI (01/2012) - presumed to be embolic likely source PFO with right to left shunt (noted per TEE 01/ 2014)    Current Outpatient Medications  Medication Sig Dispense Refill  . ACCU-CHEK FASTCLIX LANCETS MISC Check blood sugar 4 times a day before meals and bedtime 204 each 5  . allopurinol (ZYLOPRIM) 100 MG tablet Take 1 tablet (100 mg total) by mouth daily. 30 tablet 0  . amiodarone (PACERONE) 200 MG tablet Take 1 tablet twice daily through 03/04/2018. On 03/05/2018, take only 1 tablet daily. 42 tablet 0  . B-D UF III MINI PEN NEEDLES 31G X 5 MM MISC Use four times daily as directed. Dx code E11.00, Z79.4 100 each 3  . Blood Glucose Monitoring Suppl (ACCU-CHEK GUIDE) w/Device KIT 1 each by Does not apply route 4 (four) times daily. 1 kit 1  . digoxin (LANOXIN) 0.125 MG tablet Take 1 tablet (0.125 mg total) by mouth daily. 30  tablet 0  . ENTRESTO 24-26 MG Take 1 tablet by mouth 2 (two) times daily.    Marland Kitchen eplerenone (INSPRA) 25 MG tablet Take 1 tablet (25 mg total) by mouth at bedtime. 30 tablet 0  . glucose blood (ACCU-CHEK GUIDE) test strip Check blood sugar 4 times a day before meal and bedtime 150 each 5  . insulin glargine (LANTUS) 100 UNIT/ML injection Inject 0.2 mLs (20 Units total) into the skin at bedtime for 30 days. 6 mL 0  . insulin lispro (HUMALOG) 100 UNIT/ML injection Inject 15 Units into the skin 3 (three) times  daily before meals.    . isosorbide-hydrALAZINE (BIDIL) 20-37.5 MG tablet Take 0.5 tablets by mouth 3 (three) times daily. 45 tablet 0  . metFORMIN (GLUCOPHAGE-XR) 500 MG 24 hr tablet Take 500 mg by mouth daily.    Marland Kitchen OZEMPIC, 0.25 OR 0.5 MG/DOSE, 2 MG/1.5ML SOPN Inject 0.25 mg into the skin once a week.    . potassium chloride SA (K-DUR,KLOR-[name]) 20 MEQ tablet Take 1 tablet (20 mEq total) by mouth daily. 30 tablet 0  . QUEtiapine (SEROQUEL) 400 MG tablet Take 1 tablet (400 mg total) by mouth 2 (two) times daily for 21 days. (Patient taking differently: Take 800 mg by mouth at bedtime. ) 42 tablet 0  . rosuvastatin (CRESTOR) 40 MG tablet Take 1 tablet (40 mg total) by mouth daily. 90 tablet 3  . torsemide (DEMADEX) 20 MG tablet Take 96m (4 tablets) each morning. Take 459m(2 tablets) each evening. 180 tablet 0  . XARELTO 20 MG TABS tablet TAKE 1 TABLET(20 MG) BY MOUTH DAILY 30 tablet 6   No current facility-administered medications for this visit.     Allergies  Allergen Reactions  . Heparin Other (See Comments)    Documented HIT under problem list Pt states he's not allergic. " They told me not take it anymore"  . Spironolactone Other (See Comments)    Breast tenderness  . Thorazine [Chlorpromazine Hcl] Other (See Comments)    Body freezes up   Vitals:   03/28/18 0837  BP: 124/90  Pulse: 71  SpO2: 97%  Weight: 123.4 kg (272 lb)    Wt Readings from Last 3 Encounters:  03/28/18  123.4 kg (272 lb)  02/19/18 122.9 kg (271 lb)  02/11/18 129.7 kg (285 lb 15 oz)    PHYSICAL EXAM: General: Well appearing. No resp difficulty. HEENT: Normal Neck: Supple. JVP 5-6. Carotids 2+ bilat; no bruits. No thyromegaly or nodule noted. Cor: PMI nondisplaced. Regular, slightly tachy, No M/G/R noted Lungs: CTAB, normal effort. Abdomen: Soft, non-tender, non-distended, no HSM. No bruits or masses. +BS  Extremities: No cyanosis, clubbing, or rash. R and LLE no edema. Chronic venous stasis changes.  Neuro: Alert & orientedx3, cranial nerves grossly intact. moves all 4 extremities w/o difficulty. Affect pleasant   ASSESSMENT /PLAN 1) Chroniccombined systolic/diastolic HF: Nonischemic cardiomyopathy.  EF 40-45%, grade II DD (10/2012) now decreased to 20-25% (05/2015) RV severely reduced. Echo 4/19 EF 30-35% RV ok. Had LHNiles014 with minimal CAD. Holter showed <3% PVC burden.  Echo 02/15/18 with EF 20-25%, severe global HK, mild RV dysfunction. Recently had admission with  Low output HF, co-ox initially 39% and started on milrinone with diuresis.   - Volume status stable on exam.  - Continue torsemide 80 qam/40 qpm. BMET today.  - Continue eplerenone 25 mg daily(breast tenderness with spiro) -Will stay off Coreg with low output - Poor candidate for corlanor with atrial tachycardias and paroxysmal Afib -Continue Entresto 24/26 mg BID.  - Increase Bidil to 1 tab TID.  - Continue digoxin 0.125 mg daily. Dig level <0.2 1/19. -RefusesHF paramedicine.  - Not a candidate for home milrinone or advanced therapies.  Not ICD candidate with chronic LE wounds.  2) PAF:  - EKG today shows NSR - Continue Xarelto 20 mg daily.Denies bleeding.  - Continue amiodarone 200 mg daily.  4) CKD stage III:  - Baseline creatinine 1.3-1.5.   - BMET today.  5) ?OSA:Has been ordered for sleep study multiple times but not attended. Would benefit from home sleep study  once available. No change. 6) DMII: -  Per PCP.  7) Medication Noncompliance: RefusesHF paramedicine. No change.  8) Hyperlipidemia - Continuecrestor. No change.  9) CVA: Noted 09/2017, likely related to atrial fibrillation.  - Continue Xarelto. No change.  10)Hx ofETOHand cocaineabuse: Denies recent use. No change.  11) LLE wound:  - Chronic venous stasis wounds.WOC consulted and gave recommendations. Per primary. ABIs 02/14/18 normal.  - Stable.   Doing well overall. Congratulated on working with a pharmacy for pill boxes, as compliance has been a chronic issue for him. Labs today. RTC 2 months. Sooner with symptoms.   Shirley Friar, PA-C  8:22 AM   Greater than 50% of the 25 minute visit was spent in counseling/coordination of care regarding disease state education, salt/fluid restriction, sliding scale diuretics, and medication compliance.

## 2018-03-28 NOTE — Patient Instructions (Addendum)
INCREASE Bidil to 1 tab, three times daily  Labs today We will only contact you if something comes back abnormal or we need to make some changes. Otherwise no news is good news!  Your physician recommends that you schedule a follow-up appointment in: 2 months   Do the following things EVERYDAY: 1) Weigh yourself in the morning before breakfast. Write it down and keep it in a log. 2) Take your medicines as prescribed 3) Eat low salt foods-Limit salt (sodium) to 2000 mg per day.  4) Stay as active as you can everyday 5) Limit all fluids for the day to less than 2 liters

## 2018-03-29 LAB — T3, FREE: T3, Free: 2.8 pg/mL (ref 2.0–4.4)

## 2018-04-01 ENCOUNTER — Telehealth (HOSPITAL_COMMUNITY): Payer: Self-pay | Admitting: Cardiology

## 2018-04-01 ENCOUNTER — Other Ambulatory Visit (HOSPITAL_COMMUNITY): Payer: Self-pay | Admitting: Cardiology

## 2018-04-01 DIAGNOSIS — I5042 Chronic combined systolic (congestive) and diastolic (congestive) heart failure: Secondary | ICD-10-CM

## 2018-04-01 NOTE — Telephone Encounter (Signed)
Notes recorded by Kerry Dory, CMA on 04/01/2018 at 1:39 PM EST Pt reports he has all medications filled through [facility] that provides pill pack, will confirm dosage with pharmacy Repeat labs 4/8 @ 2 ------  Notes recorded by Vanice Sarah, CMA on 03/30/2018 at 11:43 AM EST Called pt but VM was full. Unable to leave message. ------  Notes recorded by Shirley Friar, PA-C on 03/30/2018 at 9:50 AM EST Please call today.    ------  Notes recorded by Shirley Friar, PA-C on 03/28/2018 at 3:54 PM EST Can you confirm how he is taking amiodarone?  If he is still taking BID, needs to go down to once daily. If taking once daily, needs to decrease to 100 mg daily.  Needs repeat Thyroid labs (TSH, Free T3, and Free T4 in one month)   Legrand Como "Oda Kilts, [place] 03/28/2018 3:54 PM

## 2018-04-04 MED ORDER — AMIODARONE HCL 100 MG PO TABS: 100.0000 mg | ORAL_TABLET | Freq: Every day | ORAL | 3 refills | Status: DC

## 2018-04-04 NOTE — Telephone Encounter (Signed)
Notes recorded by Kerry Dory, CMA on 04/04/2018 at 9:39 AM EDT Verified with pharmacy patient is currently taking 200 mg daily, new script sent over for 100 mg daily

## 2018-04-04 NOTE — Addendum Note (Signed)
Addended by: Kathyjo Briere, Sharlot Gowda on: 04/04/2018 03:37 PM   Modules accepted: Orders

## 2018-04-18 ENCOUNTER — Other Ambulatory Visit (HOSPITAL_COMMUNITY): Payer: Self-pay | Admitting: *Deleted

## 2018-04-18 MED ORDER — DIGOXIN 125 MCG PO TABS: 0.1250 mg | ORAL_TABLET | Freq: Every day | ORAL | 6 refills | Status: AC

## 2018-04-18 MED ORDER — EPLERENONE 25 MG PO TABS: 25.0000 mg | ORAL_TABLET | Freq: Every day | ORAL | 6 refills | Status: AC

## 2018-04-18 MED ORDER — RIVAROXABAN 20 MG PO TABS: ORAL_TABLET | ORAL | 6 refills | Status: AC

## 2018-04-18 MED ORDER — POTASSIUM CHLORIDE CRYS ER 20 MEQ PO TBCR: 20.0000 meq | EXTENDED_RELEASE_TABLET | Freq: Every day | ORAL | 6 refills | Status: AC

## 2018-04-22 ENCOUNTER — Other Ambulatory Visit (HOSPITAL_COMMUNITY): Payer: Self-pay | Admitting: *Deleted

## 2018-04-22 MED ORDER — TORSEMIDE 20 MG PO TABS: ORAL_TABLET | ORAL | 6 refills | Status: DC

## 2018-04-29 ENCOUNTER — Telehealth (HOSPITAL_COMMUNITY): Payer: Self-pay

## 2018-04-29 NOTE — Telephone Encounter (Signed)
Spoke with patient, pt will update insurance company with his current demographics as he recently moved to a new address. Pt states he will call insurance company as soon as possible.  Will attempt PA again on Monday.

## 2018-04-29 NOTE — Telephone Encounter (Signed)
Received PA request for patient for Amiodarone.  Attempted to initiate request however system does not recognize patient.  Attempted to reach patient to confirm updated information of demographics with insurance.  Unable to leave message as mailbox full, will make another attempt later.

## 2018-05-03 NOTE — Telephone Encounter (Signed)
Spoke with patient as an update, pt still have not updated his insurance company. Pt states he will do so today. Advised I will call him back later

## 2018-05-04 ENCOUNTER — Other Ambulatory Visit (HOSPITAL_COMMUNITY): Payer: Self-pay

## 2018-05-04 NOTE — Telephone Encounter (Signed)
CALLED PATIENT, HIS SISTER STATES SHE WILL CALL INSURANCE COMPANY TO UPDATE DEMOGRAPHICS

## 2018-05-05 ENCOUNTER — Telehealth (HOSPITAL_COMMUNITY): Payer: Self-pay

## 2018-05-05 NOTE — Telephone Encounter (Signed)
Spoke with [facility], Rx ID number corrected and PA initiated for Amiodarone 100mg  daily through [employer] via CMM

## 2018-05-05 NOTE — Telephone Encounter (Signed)
Pt called and made aware of same

## 2018-05-05 NOTE — Telephone Encounter (Signed)
Prior authorization through [employer] was APPROVED for Amiodarone 100mg  and will expire on 01/26/2019.

## 2018-05-06 NOTE — Telephone Encounter (Signed)
Per CMM, medication available without prior authorization

## 2018-05-24 ENCOUNTER — Other Ambulatory Visit: Payer: Self-pay | Admitting: Cardiology

## 2018-05-29 NOTE — Progress Notes (Signed)
Heart Failure TeleHealth Note  Due to national recommendations of social distancing due to [name] [name], Audio/video telehealth visit is felt to be most appropriate for this patient at this time.  See [name] [name] message from today for patient consent regarding telehealth for [name] [name].  Date:  05/30/2018   ID:  [name] [name], DOB 1955/06/07, MRN [id number]  Location: Home  Provider location: [facility] Type of Visit: Established patient   PCP:  [name] Cha, MD  Cardiologist:  No primary care provider on file. Primary HF: Dr [name] [name]   Chief Complaint: Heart Failure  History of Present Illness: [name] [name] is a 63 y.o. 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 Had history of acute renal failure in setting of rhabdo requiring short term HD.     Admitted 8/13 - 8/[name] with A/C CHF. Diuresed with IV lasix and metolazone for a total of 26 lbs down. Restarted on Entresto and Torsemide 80 mg daily for home. Discharge weight 282 lbs.   Admitted 9/27 - 9/29 after fall secondary to cocaine and ETOH binge. MRI showed acute CVA in setting of not taking his Xarelto.   Admitted 1/[name] - 02/19/2018  with low output CHF. Was transiently on milrinone, but weaned off prior to discharge.   He presents via Psychiatric nurse for a telehealth visit today.   Overall feeling fine. Denies PND/Orthopnea. He denies bleeding issues. Appetite ok. Tries to drink < 2 liters per day. Says he takes and an extra torsemide about twice a month. Lower extremity wounds have healed up. No fever or chills. Weight at home 272-275 pounds. Taking all medications. He now has his medications in a bubble pack. Denies cocaine use. Lives alone.    he denies symptoms worrisome for COVID [name].   ECHO 10/27/12 EF 40-45%  ECHO 06/04/15 LVEF 20-25%, decreased diastolic compliance, LAE, RV severely reduced.  ECHO 11/26/15 EF 30%  Echo 12/26/15 EF 30-35% RV ok.  ECHO 5/6/[name] EF 25-30% Echo 8/14/[name]: EF 25-30%  Past Medical History:  Diagnosis Date  . Atrial fibrillation ([place])   . Cancer [name])    Prostate cancer-bx. 3 weeks ago  . Cataract   . Chronic combined systolic and diastolic CHF (congestive heart failure) (HCC)    a) EF 40-45% per 2D echo (02/2012) with grade 1 DD b)  NICM c) RHC (04/2012): RA: 4, RV 45/3/4, PA 42/9 (24), PCWP 14, Fick CO/CI: 5.2 /2.2, PVR 1.9 WU, PA 62% and 64% d) ECHO (10/2012) EF 40-45%, grade II DD, RV nl  . CKD (chronic kidney disease) stage 2, GFR 60-89 ml/min    BL SCr approximately 1-1.3  . Colitis 05/2009   History of colitis of ascending colon noted on CT abd/pelvis (05/2009), with interval resolution with subsequent CT  . Continuous chronic alcoholism ([place])   . Degenerative lumbar spinal stenosis    s/p L2-3, L3-4, L4-5 laminectomy partial facetectomy, and bilateral foraminotomy  . Diabetes mellitus without complication ([name])    Type II  . Dysrhythmia    A. Fib  . Family history of adverse reaction to anesthesia    "sister, can't go to sleep"  . GERD (gastroesophageal reflux disease)   . H/O cocaine abuse ([place])   . Hepatic steatosis    suspected 2/2  alcohol abuse  . Hepatitis C   . History of pancreatitis 01/2011   Admission for acute pancreatitis presumed 2/2 ongoing alcohol abuse- and hypertriglyceridemia  . History of pneumonia   . HIT (heparin-induced thrombocytopenia) ([place])   . Hyperlipidemia   . Hypertension    uncontrolled with medication noncompliance  . Hypertriglyceridemia   . NICM (nonischemic cardiomyopathy) ([place])    a. LHC (04/2012): nl arteries  . PFO (patent foramen ovale) 01/2012   with right to left shunt, noted per TEE in evaluation for source of embolic stroke in 03/8099  .  Rhabdomyolysis 02/22/2012   H/O rhabdomyolysis in 01/2012 that was idiopathic, cause never identified  . Schizophrenia, schizo-affective ([place])   . Shortness of breath dyspnea   . Splenic cyst   . Stroke ([place]) 01/2012   Small cerebellar infarcts right greater than left as well as questionable acute left external capsule and caudate nuclear punctate lacunar infarcts noted per MRI (01/2012) - presumed to be embolic likely source PFO with right to left shunt (noted per TEE 01/ 2014)   Past Surgical History:  Procedure Laterality Date  . ACHILLES TENDON REPAIR Right 2007   "it was torn"  . BACK SURGERY    . CARDIAC CATHETERIZATION N/A 09/02/2015   Procedure: Right Heart Cath;  Surgeon: Jolaine Artist, MD;  Location: [facility];  Service: Cardiovascular;  Laterality: N/A;  . CATARACT EXTRACTION W/ INTRAOCULAR LENS IMPLANT Right   . ESOPHAGOGASTRODUODENOSCOPY N/A 06/25/2012   Procedure: ESOPHAGOGASTRODUODENOSCOPY (EGD);  Surgeon: Milus Banister, MD;  Location: [facility];  Service: Endoscopy;  Laterality: N/A;  . I&D EXTREMITY  03/20/2011   Procedure: IRRIGATION AND DEBRIDEMENT EXTREMITY;  Surgeon: Kerin Salen, MD;  Location: [facility];  Service: Orthopedics;  Laterality: Left;  I&D LEFT ACHILLIES TENDON  . KNEE ARTHROSCOPY Left   . LUMBAR LAMINECTOMY     L2-3, L3-4, L4-5 laminectomy, partial facetectomy  . RESECTION DISTAL CLAVICAL  09/17/2011   Procedure: RESECTION DISTAL CLAVICAL;  Surgeon: Nita Sells, MD;  Location: [facility];  Service: Orthopedics;  Laterality: Right;  right shoulder arthroscopy with sad and open distal clavicle excision   . ROBOT ASSISTED LAPAROSCOPIC RADICAL PROSTATECTOMY N/A 12/16/2012   Procedure: ROBOTIC ASSISTED LAPAROSCOPIC PROSTATECTOMY ;  Surgeon: Ardis Hughs, MD;  Location: [facility];  Service: Urology;  Laterality: N/A;  . TONSILLECTOMY       Current Outpatient Medications  Medication Sig Dispense Refill  . ACCU-CHEK  FASTCLIX LANCETS MISC Check blood sugar 4 times a day before meals and bedtime 204 each 5  . allopurinol (ZYLOPRIM) 100 MG tablet Take 1 tablet (100 mg total) by mouth daily. 30 tablet 0  . amiodarone (PACERONE) 100 MG tablet Take 1 tablet (100 mg total) by mouth daily. 30 tablet 3  . B-D UF III MINI PEN NEEDLES 31G X 5 MM MISC Use four times daily as directed. Dx code E11.00, Z79.4 100 each 3  . Blood Glucose Monitoring Suppl (ACCU-CHEK GUIDE) w/Device KIT 1 each by Does not apply route 4 (four) times daily. 1 kit 1  . digoxin (LANOXIN) 0.125 MG tablet Take 1 tablet (0.125 mg total) by mouth daily. 30 tablet 6  . ENTRESTO 24-26 MG TAKE 1 TABLET BY MOUTH TWICE DAILY STOP TAKING LOSARTAN 180 tablet 0  . eplerenone (INSPRA) 25 MG tablet Take 1 tablet (25 mg total) by mouth at bedtime. 30 tablet 6  . glucose blood (ACCU-CHEK GUIDE) test strip Check blood sugar 4  times a day before meal and bedtime 150 each 5  . insulin glargine (LANTUS) 100 UNIT/ML injection Inject 0.2 mLs (20 Units total) into the skin at bedtime for 30 days. 6 mL 0  . insulin lispro (HUMALOG) 100 UNIT/ML injection Inject 15 Units into the skin 3 (three) times daily before meals.    . isosorbide-hydrALAZINE (BIDIL) 20-37.5 MG tablet Take 1 tablet by mouth 3 (three) times daily. 90 tablet 3  . metFORMIN (GLUCOPHAGE-XR) 500 MG 24 hr tablet Take 500 mg by mouth daily.    Marland Kitchen OZEMPIC, 0.25 OR 0.5 MG/DOSE, 2 MG/1.5ML SOPN Inject 0.25 mg into the skin once a week.    . potassium chloride SA (K-DUR,KLOR-[name]) 20 MEQ tablet Take 1 tablet (20 mEq total) by mouth daily. 30 tablet 6  . QUEtiapine (SEROQUEL) 400 MG tablet Take 1 tablet (400 mg total) by mouth 2 (two) times daily for 21 days. (Patient taking differently: Take 800 mg by mouth at bedtime. ) 42 tablet 0  . rivaroxaban (XARELTO) 20 MG TABS tablet TAKE 1 TABLET(20 MG) BY MOUTH DAILY 30 tablet 6  . rosuvastatin (CRESTOR) 40 MG tablet Take 1 tablet (40 mg total) by mouth daily. 90 tablet 3   . torsemide (DEMADEX) 20 MG tablet Take 8m (4 tablets) each morning. Take 472m(2 tablets) each evening. 180 tablet 6   No current facility-administered medications for this encounter.     Allergies:   Heparin; Spironolactone; and Thorazine [chlorpromazine hcl]   Social History:  The patient  reports that he quit smoking about 16 years ago. His smoking use included cigarettes. He has a 30.00 pack-year smoking history. He has never used smokeless tobacco. He reports current alcohol use of about 6.0 standard drinks of alcohol per week. He reports current drug use. Drugs: "Crack" cocaine and Cocaine.   Family History:  The patient's family history includes CAD in his sister; CAD (age of onset: 3963in his brother; CAD (age of onset: 4065in his mother; Hypertension in an other family member.   ROS:  Please see the history of present illness.   All other systems are personally reviewed and negative.   Exam:  Tele Health Call; Exam is subjective  General:  Speaks in full sentences. No resp difficulty. Lungs: Normal respiratory effort with conversation.  Abdomen: Non-distended per patient report Extremities: Pt denies edema. Neuro: Alert & oriented x 3.   Recent Labs: 02/14/2018: B Natriuretic Peptide 354.1 02/16/2018: Magnesium 2.1 03/28/2018: ALT 20; BUN 15; Creatinine, Ser 1.22; Hemoglobin 14.2; Platelets 165; Potassium 3.7; Sodium 138; TSH 5.146  Personally reviewed   Wt Readings from Last 3 Encounters:  03/28/18 123.4 kg (272 lb)  02/19/18 122.9 kg (271 lb)  02/11/18 129.7 kg (285 lb 15 oz)      ASSESSMENT AND PLAN: 1) Chroniccombined systolic/diastolic HFVZ:CHYIFOYDXAJardiomyopathy.EF 40-45%, grade II DD (10/2012) now decreased to 20-25% (05/2015) RV severely reduced. Echo 4/[name] EF 30-35% RV ok. [place] with minimal CAD. Holter showed <3% PVC burden. Echo 02/15/18 withEF 20-25%, severe global HK, mild RV dysfunction. Not ICD candidate with chronic LE wounds.  NYHA II-IIIb   - Volume status stable. Continue torsemide 80 qam/40 qpm.   - Continue eplerenone 25 mg daily(breast tenderness with spiro) -Will stay off Coregwith low output - Poor candidate for corlanor with atrial tachycardias and paroxysmal Afib -Continue Entresto 24/26 mg BID.  -Continue Bidil to 1 tab TID.  - Continue digoxin 0.125 mg daily. Dig level <0.2 1/[name]. - Not a  candidate for home milrinone or advanced therapies.  2) PAF: - Continue Xarelto 20 mg daily.Denies bleeding.  - Continue amiodarone 200 mg daily.  4) CKD stage III: - Baseline creatinine 1.3-1.5.  5) Snores: He hs missed 2 appointments for sleep study. He is interested in a home sleep study. He has asked that we contact his sister to make sure she can help him with this.  6) DMII: - Per PCP.  7) Medication Noncompliance: 8) Hyperlipidemia - Continuecrestor. No change.  9) BSW:HQPRF 09/2017, likely related to atrial fibrillation. - Continue Xarelto. 10)Hx ofETOHand cocaineabuse:Encouraged to stop drinking alcohol. Denies cocaine use.   11) LLE wound:resolved.   COVID screen The patient does not have any symptoms that suggest any further testing/ screening at this time.  Social distancing reinforced today.  Patient Risk: After full review of this patients clinical status, I feel that they are at moderate risk for cardiac decompensation at this time.  Relevant cardiac medications were reviewed at length with the patient today. The patient does not have concerns regarding their medications at this time.   The following changes were made today: Set up home sleep study.  Recommended follow-up:  Follow up in 2 months.   Today, I have spent 15 minutes with the patient with telehealth technology discussing the above issues .    Jeanmarie Hubert, NP  05/30/2018 8:27 AM  [facility] [address] [facility] [place] [address] [phone] (office)  [phone] (fax)

## 2018-05-30 ENCOUNTER — Other Ambulatory Visit: Payer: Self-pay

## 2018-05-30 ENCOUNTER — Encounter (HOSPITAL_COMMUNITY): Payer: Self-pay

## 2018-05-30 ENCOUNTER — Ambulatory Visit (HOSPITAL_COMMUNITY)
Admission: RE | Admit: 2018-05-30 | Discharge: 2018-05-30 | Disposition: A | Discharge status: Home / Self Care | Payer: Medicare Other | Source: Ambulatory Visit | Attending: Internal Medicine | Admitting: Internal Medicine

## 2018-05-30 VITALS — Wt 274.0 lb

## 2018-05-30 DIAGNOSIS — F1010 Alcohol abuse, uncomplicated: Secondary | ICD-10-CM

## 2018-05-30 DIAGNOSIS — I5042 Chronic combined systolic (congestive) and diastolic (congestive) heart failure: Secondary | ICD-10-CM | POA: Diagnosis not present

## 2018-05-30 DIAGNOSIS — I480 Paroxysmal atrial fibrillation: Secondary | ICD-10-CM

## 2018-05-30 DIAGNOSIS — R0683 Snoring: Secondary | ICD-10-CM

## 2018-05-30 NOTE — Addendum Note (Signed)
Encounter addended by: Valeda Malm, RN on: 05/30/2018 9:49 AM  Actions taken: Clinical Note Signed

## 2018-05-30 NOTE — Progress Notes (Signed)
LM on sisters VM to discuss AVS.  She is his point of contact.  Pt added to home study list.  AVS mailed.

## 2018-05-30 NOTE — Addendum Note (Signed)
Encounter addended by: Valeda Malm, RN on: 05/30/2018 8:57 AM  Actions taken: Clinical Note Signed

## 2018-05-30 NOTE — Patient Instructions (Addendum)
You will be set up for a home sleep study once testing opens back up.  You will be called to arrange this.  Due to Covid-19 there are limitations to testing currently.   Recommended follow-up: Follow up in 2 months with APP. You will get a call to arrange this appointment.   Please call office if you have any questions or concerns.

## 2018-05-30 NOTE — Progress Notes (Signed)
Pts sister called back, stating patient called her and wanted her to speak with office about AVS.  AVS discussed, no questions endorsed, verbalized understanding of all information.

## 2018-06-02 NOTE — Addendum Note (Signed)
Encounter addended by: Scarlette Calico, RN on: 06/02/2018 3:55 PM  Actions taken: Order list changed, Diagnosis association updated

## 2018-06-03 ENCOUNTER — Telehealth (HOSPITAL_COMMUNITY): Payer: Self-pay

## 2018-06-03 NOTE — Telephone Encounter (Signed)
Patient Name:         DOB:       Height:     Weight:  Office Name:         Referring Provider:  Today's Date:  Date:   STOP BANG RISK ASSESSMENT S (snore) Have you been told that you snore?     NO   T (tired) Are you often tired, fatigued, or sleepy during the day?   YES  O (obstruction) Do you stop breathing, choke, or gasp during sleep? NO   P (pressure) Do you have or are you being treated for high blood pressure? YES   B (BMI) Is your body index greater than 35 kg/m? YES   A (age) Are you 50 years old or older? YES   N (neck) Do you have a neck circumference greater than 16 inches?   YES/NO   G (gender) Are you a male? YES   TOTAL STOP/BANG "YES" ANSWERS                                                                        For Office Use Only              Procedure Order Form    YES to 3+ Stop Bang questions OR two clinical symptoms - patient qualifies for WatchPAT (CPT 95800)     Submit: This Form + Patient Face Sheet + Clinical Note via [employer] or Fax: [phone]         Clinical Notes: Will consult Sleep Specialist and refer for management of therapy due to patient increased risk of Sleep Apnea. Ordering a sleep study due to the following two clinical symptoms: Excessive daytime sleepiness G47.10 / Gastroesophageal reflux K21.9 / Nocturia R35.1 / Morning Headaches G44.221 / Difficulty concentrating R41.840 / Memory problems or poor judgment G31.84 / Personality changes or irritability R45.4 / Loud snoring R06.83 / Depression F32.9 / Unrefreshed by sleep G47.8 / Impotence N52.9 / History of high blood pressure R03.0 / Insomnia G47.00

## 2018-06-06 ENCOUNTER — Telehealth (HOSPITAL_COMMUNITY): Payer: Self-pay | Admitting: *Deleted

## 2018-06-06 NOTE — Telephone Encounter (Signed)
Order, stop bang, OV note and demographics all faxed to [facility] via epic

## 2018-07-18 ENCOUNTER — Ambulatory Visit: Payer: Medicare Other | Admitting: Podiatry

## 2018-07-22 ENCOUNTER — Other Ambulatory Visit (HOSPITAL_COMMUNITY): Payer: Self-pay | Admitting: Student

## 2018-07-28 ENCOUNTER — Telehealth (HOSPITAL_COMMUNITY): Payer: Self-pay | Admitting: Adult Health

## 2018-07-28 NOTE — Telephone Encounter (Signed)
COVID screening completed. ° °-[facility] °

## 2018-08-01 ENCOUNTER — Ambulatory Visit (HOSPITAL_COMMUNITY)
Admission: RE | Admit: 2018-08-01 | Discharge: 2018-08-01 | Disposition: A | Discharge status: Home / Self Care | Payer: Medicare Other | Source: Ambulatory Visit | Attending: Cardiology | Admitting: Cardiology

## 2018-08-01 ENCOUNTER — Other Ambulatory Visit: Payer: Self-pay

## 2018-08-01 ENCOUNTER — Encounter (HOSPITAL_COMMUNITY): Payer: Self-pay

## 2018-08-01 VITALS — BP 122/99 | HR 55 | Wt 292.6 lb

## 2018-08-01 DIAGNOSIS — N183 Chronic kidney disease, stage 3 (moderate): Secondary | ICD-10-CM | POA: Diagnosis not present

## 2018-08-01 DIAGNOSIS — E785 Hyperlipidemia, unspecified: Secondary | ICD-10-CM | POA: Insufficient documentation

## 2018-08-01 DIAGNOSIS — Z8619 Personal history of other infectious and parasitic diseases: Secondary | ICD-10-CM | POA: Insufficient documentation

## 2018-08-01 DIAGNOSIS — Z79899 Other long term (current) drug therapy: Secondary | ICD-10-CM | POA: Diagnosis not present

## 2018-08-01 DIAGNOSIS — R0683 Snoring: Secondary | ICD-10-CM

## 2018-08-01 DIAGNOSIS — Z7901 Long term (current) use of anticoagulants: Secondary | ICD-10-CM | POA: Diagnosis not present

## 2018-08-01 DIAGNOSIS — I130 Hypertensive heart and chronic kidney disease with heart failure and stage 1 through stage 4 chronic kidney disease, or unspecified chronic kidney disease: Secondary | ICD-10-CM | POA: Insufficient documentation

## 2018-08-01 DIAGNOSIS — F1010 Alcohol abuse, uncomplicated: Secondary | ICD-10-CM | POA: Diagnosis not present

## 2018-08-01 DIAGNOSIS — I5042 Chronic combined systolic (congestive) and diastolic (congestive) heart failure: Secondary | ICD-10-CM

## 2018-08-01 DIAGNOSIS — E1122 Type 2 diabetes mellitus with diabetic chronic kidney disease: Secondary | ICD-10-CM | POA: Diagnosis not present

## 2018-08-01 DIAGNOSIS — Z87891 Personal history of nicotine dependence: Secondary | ICD-10-CM | POA: Diagnosis not present

## 2018-08-01 DIAGNOSIS — M48061 Spinal stenosis, lumbar region without neurogenic claudication: Secondary | ICD-10-CM | POA: Insufficient documentation

## 2018-08-01 DIAGNOSIS — Z794 Long term (current) use of insulin: Secondary | ICD-10-CM | POA: Insufficient documentation

## 2018-08-01 DIAGNOSIS — K219 Gastro-esophageal reflux disease without esophagitis: Secondary | ICD-10-CM | POA: Insufficient documentation

## 2018-08-01 DIAGNOSIS — I480 Paroxysmal atrial fibrillation: Secondary | ICD-10-CM | POA: Insufficient documentation

## 2018-08-01 DIAGNOSIS — Z8673 Personal history of transient ischemic attack (TIA), and cerebral infarction without residual deficits: Secondary | ICD-10-CM | POA: Diagnosis not present

## 2018-08-01 DIAGNOSIS — I428 Other cardiomyopathies: Secondary | ICD-10-CM | POA: Insufficient documentation

## 2018-08-01 DIAGNOSIS — Z8546 Personal history of malignant neoplasm of prostate: Secondary | ICD-10-CM | POA: Diagnosis not present

## 2018-08-01 DIAGNOSIS — Z8249 Family history of ischemic heart disease and other diseases of the circulatory system: Secondary | ICD-10-CM | POA: Diagnosis not present

## 2018-08-01 DIAGNOSIS — F1410 Cocaine abuse, uncomplicated: Secondary | ICD-10-CM | POA: Insufficient documentation

## 2018-08-01 DIAGNOSIS — F209 Schizophrenia, unspecified: Secondary | ICD-10-CM | POA: Insufficient documentation

## 2018-08-01 DIAGNOSIS — E781 Pure hyperglyceridemia: Secondary | ICD-10-CM | POA: Insufficient documentation

## 2018-08-01 LAB — CBC
HCT: 44.2 % (ref 39.0–52.0)
Hemoglobin: 14 g/dL (ref 13.0–17.0)
MCH: 30.3 pg (ref 26.0–34.0)
MCHC: 31.7 g/dL (ref 30.0–36.0)
MCV: 95.7 fL (ref 80.0–100.0)
Platelets: 173 10*3/uL (ref 150–400)
RBC: 4.62 MIL/uL (ref 4.22–5.81)
RDW: 17.2 % — ABNORMAL HIGH (ref 11.5–15.5)
WBC: 6.4 10*3/uL (ref 4.0–10.5)
nRBC: 0 % (ref 0.0–0.2)

## 2018-08-01 LAB — COMPREHENSIVE METABOLIC PANEL
ALT: 42 U/L (ref 0–44)
AST: 44 U/L — ABNORMAL HIGH (ref 15–41)
Albumin: 3.7 g/dL (ref 3.5–5.0)
Alkaline Phosphatase: 82 U/L (ref 38–126)
Anion gap: 11 (ref 5–15)
BUN: 12 mg/dL (ref 8–23)
CO2: 22 mmol/L (ref 22–32)
Calcium: 9.9 mg/dL (ref 8.9–10.3)
Chloride: 104 mmol/L (ref 98–111)
Creatinine, Ser: 1.05 mg/dL (ref 0.61–1.24)
GFR calc Af Amer: >60 mL/min (ref >60)
GFR calc non Af Amer: >60 mL/min (ref >60)
Glucose, Bld: 138 mg/dL — ABNORMAL HIGH (ref 70–99)
Potassium: 3.4 mmol/L — ABNORMAL LOW (ref 3.5–5.1)
Sodium: 137 mmol/L (ref 135–145)
Total Bilirubin: 1.5 mg/dL — ABNORMAL HIGH (ref 0.3–1.2)
Total Protein: 7.4 g/dL (ref 6.5–8.1)

## 2018-08-01 LAB — T4, FREE: Free T4: 0.79 ng/dL (ref 0.61–1.12)

## 2018-08-01 LAB — TSH: TSH: 4.908 u[IU]/mL — ABNORMAL HIGH (ref 0.350–4.500)

## 2018-08-01 NOTE — Addendum Note (Signed)
Encounter addended by: Louann Liv, LCSW on: 08/01/2018 3:35 PM  Actions taken: Clinical Note Signed

## 2018-08-01 NOTE — Patient Instructions (Addendum)
It was great to see you today! No medication changes are needed at this time.   You have been referred to Heart Failure Social Worker -someone will be in contact with you  Your physician recommends that you schedule a follow-up appointment in: 4 weeks with Amy Clegg,NP  Do the following things EVERYDAY: 1) Weigh yourself in the morning before breakfast. Write it down and keep it in a log. 2) Take your medicines as prescribed 3) Eat low salt foods-Limit salt (sodium) to 2000 mg per day.  4) Stay as active as you can everyday 5) Limit all fluids for the day to less than 2 liters  At the [facility], you and your health needs are our priority. As part of our continuing mission to provide you with exceptional heart care, we have created designated Provider Care Teams. These Care Teams include your primary Cardiologist (physician) and Advanced Practice Providers (APPs- Physician Assistants and Nurse Practitioners) who all work together to provide you with the care you need, when you need it.   You may see any of the following providers on your designated Care Team at your next follow up: Marland Kitchen Dr Glori Bickers . Dr Loralie Champagne . Darrick Grinder, NP

## 2018-08-01 NOTE — Progress Notes (Signed)
ZOX:[id number], [name], MD   Primary Cardiologist: Dr Haroldine Laws   HPI: [name] [name] is a 63 y.o. 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 Had history of acute renal failure in setting of rhabdo requiring short term HD.   Admitted 8/13 - 8/19 with A/C CHF. Diuresed with IV lasix and metolazone for a total of 26 lbs down. Restarted on Entresto and Torsemide 80 mg daily for home. Discharge weight 282 lbs.   Admitted 9/27 - 9/29 after fall secondary to cocaine and ETOH binge. MRI showed acute CVA in setting of not taking his Xarelto.  Admitted1/19 - 02/19/2018 with low output CHF. Was transiently on milrinone, but weaned off prior to discharge.   Today he returns for HF follow up with his sister [name] [name]. Overall feeling terrible. SOB with exertion.  Denies PND/Orthopnea. Appetite ok. No fever or chills. Not weighing at home. Drinking 1/5 of liquir a day + wine every day. Using cocaine multiple times a week. Not taking his medications as directed. Misses days at a time. Lives alone.   ECHO 10/27/12 EF 40-45%  ECHO 06/04/15 LVEF 20-25%, decreased diastolic compliance, LAE, RV severely reduced.  ECHO 11/26/15 EF 30%  Echo 12/26/15 EF 30-35% RV ok.  ECHO 05/31/17 EF 25-30% Echo 09/08/17: EF 25-30%  ROS: All systems negative except as listed in HPI, PMH and Problem List.  SH:  Social History   Socioeconomic History  . Marital status: Single    Spouse name: Not on file  . Number of children: Not on file  . Years of education: 12th grade  . Highest education level: Not on file  Occupational History  . Occupation: Disability    Comment: 2/2 schizophrenia  Social Needs  . Financial resource strain: Not on file  . Food insecurity   Worry: Not on file    Inability: Not on file  . Transportation needs    Medical: Not on file    Non-medical: Not on file  Tobacco Use  . Smoking status: Former Smoker    Packs/day: 1.00    Years: 30.00    Pack years: 30.00    Types: Cigarettes    Quit date: 06/24/2001    Years since quitting: 17.1  . Smokeless tobacco: Never Used  Substance and Sexual Activity  . Alcohol use: Yes    Alcohol/week: 6.0 standard drinks    Types: 6 Glasses of wine per week    Comment: occasinally  . Drug use: Yes    Types: "Crack" cocaine, Cocaine  . Sexual activity: Yes  Lifestyle  . Physical activity    Days per week: Not on file    Minutes per session: Not on file  . Stress: Not on file  Relationships  . Social Herbalist on phone: Not on file    Gets together: Not on file    Attends religious service: Not on file    Active member of club or organization: Not on file    Attends meetings of clubs or organizations: Not on file    Relationship status: Not on file  . Intimate partner violence    Fear of current or ex partner: Not on file    Emotionally abused: Not on file    Physically abused: Not on file  Forced sexual activity: Not on file  Other Topics Concern  . Not on file  Social History Narrative   Lives in [place] alone, has a [facility] aide that helps with medications 4-5 days a week with medications, helping to clean.    FH:  Family History  Problem Relation Age of Onset  . CAD Mother 46       deceased  . CAD Sister   . CAD Brother 20       died from MI at age 57yo  . Hypertension Other     Past Medical History:  Diagnosis Date  . Atrial fibrillation ([place])   . Cancer [facility])    Prostate cancer-bx. 3 weeks ago  . Cataract   . Chronic combined systolic and diastolic CHF (congestive heart failure) (HCC)    a) EF 40-45% per 2D echo (02/2012) with grade 1 DD b)  NICM c) RHC (04/2012): RA: 4, RV 45/3/4, PA 42/9 (24), PCWP 14, Fick CO/CI: 5.2 /2.2, PVR 1.9 WU, PA 62%  and 64% d) ECHO (10/2012) EF 40-45%, grade II DD, RV nl  . CKD (chronic kidney disease) stage 2, GFR 60-89 ml/min    BL SCr approximately 1-1.3  . Colitis 05/2009   History of colitis of ascending colon noted on CT abd/pelvis (05/2009), with interval resolution with subsequent CT  . Continuous chronic alcoholism ([place])   . Degenerative lumbar spinal stenosis    s/p L2-3, L3-4, L4-5 laminectomy partial facetectomy, and bilateral foraminotomy  . Diabetes mellitus without complication ([place])    Type II  . Dysrhythmia    A. Fib  . Family history of adverse reaction to anesthesia    "sister, can't go to sleep"  . GERD (gastroesophageal reflux disease)   . H/O cocaine abuse ([place])   . Hepatic steatosis    suspected 2/2 alcohol abuse  . Hepatitis C   . History of pancreatitis 01/2011   Admission for acute pancreatitis presumed 2/2 ongoing alcohol abuse- and hypertriglyceridemia  . History of pneumonia   . HIT (heparin-induced thrombocytopenia) ([place])   . Hyperlipidemia   . Hypertension    uncontrolled with medication noncompliance  . Hypertriglyceridemia   . NICM (nonischemic cardiomyopathy) ([place])    a. LHC (04/2012): nl arteries  . PFO (patent foramen ovale) 01/2012   with right to left shunt, noted per TEE in evaluation for source of embolic stroke in 02/3534  . Rhabdomyolysis 02/22/2012   H/O rhabdomyolysis in 01/2012 that was idiopathic, cause never identified  . Schizophrenia, schizo-affective ([place])   . Shortness of breath dyspnea   . Splenic cyst   . Stroke ([place]) 01/2012   Small cerebellar infarcts right greater than left as well as questionable acute left external capsule and caudate nuclear punctate lacunar infarcts noted per MRI (01/2012) - presumed to be embolic likely source PFO with right to left shunt (noted per TEE 01/ 2014)    Current Outpatient Medications  Medication Sig Dispense Refill  . ACCU-CHEK FASTCLIX LANCETS MISC Check blood sugar 4 times a day before meals and  bedtime 204 each 5  . allopurinol (ZYLOPRIM) 100 MG tablet Take 1 tablet (100 mg total) by mouth daily. 30 tablet 0  . amiodarone (PACERONE) 100 MG tablet Take 1 tablet (100 mg total) by mouth daily. 30 tablet 3  . B-D UF III MINI PEN NEEDLES 31G X 5 MM MISC Use four times daily as directed. Dx code E11.00, Z79.4 100 each 3  . BIDIL 20-37.5 MG tablet TAKE 1  TABLET BY MOUTH 3 (THREE) TIMES DAILY. 90 tablet 2  . Blood Glucose Monitoring Suppl (ACCU-CHEK GUIDE) w/Device KIT 1 each by Does not apply route 4 (four) times daily. 1 kit 1  . digoxin (LANOXIN) 0.125 MG tablet Take 1 tablet (0.125 mg total) by mouth daily. 30 tablet 6  . ENTRESTO 24-26 MG TAKE 1 TABLET BY MOUTH TWICE DAILY STOP TAKING LOSARTAN 180 tablet 0  . eplerenone (INSPRA) 25 MG tablet Take 1 tablet (25 mg total) by mouth at bedtime. 30 tablet 6  . glucose blood (ACCU-CHEK GUIDE) test strip Check blood sugar 4 times a day before meal and bedtime 150 each 5  . insulin lispro (HUMALOG) 100 UNIT/ML injection Inject 15 Units into the skin 3 (three) times daily before meals.    . metFORMIN (GLUCOPHAGE-XR) 500 MG 24 hr tablet Take 500 mg by mouth daily.    Marland Kitchen OZEMPIC, 0.25 OR 0.5 MG/DOSE, 2 MG/1.5ML SOPN Inject 0.25 mg into the skin once a week.    . potassium chloride SA (K-DUR,KLOR-[name]) 20 MEQ tablet Take 1 tablet (20 mEq total) by mouth daily. 30 tablet 6  . rivaroxaban (XARELTO) 20 MG TABS tablet TAKE 1 TABLET(20 MG) BY MOUTH DAILY 30 tablet 6  . rosuvastatin (CRESTOR) 40 MG tablet Take 1 tablet (40 mg total) by mouth daily. 90 tablet 3  . torsemide (DEMADEX) 20 MG tablet Take '80mg'$  (4 tablets) each morning. Take '40mg'$  (2 tablets) each evening. 180 tablet 6  . insulin glargine (LANTUS) 100 UNIT/ML injection Inject 0.2 mLs (20 Units total) into the skin at bedtime for 30 days. 6 mL 0  . QUEtiapine (SEROQUEL) 400 MG tablet Take 1 tablet (400 mg total) by mouth 2 (two) times daily for 21 days. (Patient taking differently: Take 800 mg by mouth  at bedtime. ) 42 tablet 0   No current facility-administered medications for this encounter.     Vitals:   08/01/18 1019  BP: (!) 122/99  Pulse: (!) 55  SpO2: 95%  Weight: 132.7 kg (292 lb 9.6 oz)    Wt Readings from Last 3 Encounters:  08/01/18 132.7 kg (292 lb 9.6 oz)  05/30/18 124.3 kg (274 lb)  03/28/18 123.4 kg (272 lb)    PHYSICAL EXAM: General:   No resp difficulty. Arrived in wheel chair.  HEENT: normal Neck: supple. JVP 11-12. Carotids 2+ bilaterally; no bruits. No lymphadenopathy or thryomegaly appreciated. Cor: PMI normal. irregular rate & rhythm. No rubs, or murmurs. + S3  Lungs: clear Abdomen: soft, nontender, nondistended. No hepatosplenomegaly. No bruits or masses. Good bowel sounds. Extremities: no cyanosis, clubbing, rash, R and LLE 2+  edema Neuro: alert & orientedx3, cranial nerves grossly intact. Moves all 4 extremities w/o difficulty. Affect pleasant.  EKG:  A Fib 116 bpm personally reviewed.   ASSESSMENT & PLAN: 1)Chroniccombined systolic/diastolic UU:[id number] cardiomyopathy.EF 40-45%, grade II DD (10/2012) now decreased to 20-25% (05/2015) RV severely reduced. Echo 4/19 EF 30-35% RV ok. Had [place] 2014 with minimal CAD. Holter showed <3% PVC burden. Echo 02/15/18 withEF 20-25%, severe global HK, mild RV dysfunction.Not ICD candidate with chronic LE wounds. NYHA III. Volume status elevated. For now I will continue his current regimen of medications and I have asked him to take all medications as ordered.  -. Continuetorsemide 80 qam/40 qpm.  - Continue eplerenone 25 mg daily(breast tenderness with spiro) -No bb with cocaine abuse.  - Poor candidate for corlanor with atrial tachycardias and paroxysmal Afib -Continue Entresto 24/26 mg BID.  -Continue Bidil  to 1 tab TID. - Continue digoxin 0.125 mg daily. Dig level <0.2 1/19. - Not a candidate for home milrinone or advanced therapies.  - Check BMET 2) PAF: Back in A fib. Encouraged to take  xarelto and amiodarone.  - Check TSH, T3, T4 4) CKD stage III: - Baseline creatinine1.3-1.5.  -Check BMET today.  5) Snores: 6) DMII: - Per PCP. 7) Hyperlipidemia - Continuecrestor.No change. 8) XLE:ZVGJF 09/2017, likely related to atrial fibrillation. - Continue Xarelto. 10)ETOHand cocaineabuse: Encouraged to stop alcohol and cocaine use. His sister says he has been drunk or high everyday.  Referred to [facility] for possible rehab.   Follow up  3-4 weeks.   Regis Wiland NP-C  11:11 AM

## 2018-08-01 NOTE — Progress Notes (Signed)
CSW referred to assist patient with detox centers as he reports daily use of alcohol/wine and reports weekly use of cocaine. Patient was with his sister in the clinic and states interest in going to an inpatient detox. Patient states he has been using alcohol and drugs for over 25 years. He states he has not been taking his medications due to alcohol/drug use. CSW explained options for detox and provided patient and his sister with locations and numbers. Patient's sister will assist patient with calls for bed availability and return call to CSW with outcome. Patient states he is motivated for recovery. CSW available as needed. Raquel Sarna, [place], [place]

## 2018-08-02 ENCOUNTER — Telehealth (HOSPITAL_COMMUNITY): Payer: Self-pay | Admitting: Licensed Clinical Social Worker

## 2018-08-02 LAB — T3, FREE: T3, Free: 2.4 pg/mL (ref 2.0–4.4)

## 2018-08-02 NOTE — Telephone Encounter (Signed)
CSW attempted to contact patient to follow up on yesterdays visit in the clinic. Patient was provided information on local detox centers and CSW following up to see if patient was able to follow through with referrals. CSW unable to leave message as mailbox full. Raquel Sarna, [place], [place]

## 2018-08-10 ENCOUNTER — Encounter (HOSPITAL_COMMUNITY): Payer: Self-pay | Admitting: Emergency Medicine

## 2018-08-10 ENCOUNTER — Emergency Department (HOSPITAL_COMMUNITY)
Admission: EM | Admit: 2018-08-10 | Discharge: 2018-08-10 | Disposition: A | Discharge status: Home / Self Care | Payer: Medicare Other | Attending: Emergency Medicine | Admitting: Emergency Medicine

## 2018-08-10 ENCOUNTER — Emergency Department (HOSPITAL_COMMUNITY): Payer: Medicare Other

## 2018-08-10 ENCOUNTER — Other Ambulatory Visit: Payer: Self-pay

## 2018-08-10 DIAGNOSIS — Z87891 Personal history of nicotine dependence: Secondary | ICD-10-CM | POA: Diagnosis not present

## 2018-08-10 DIAGNOSIS — E1122 Type 2 diabetes mellitus with diabetic chronic kidney disease: Secondary | ICD-10-CM | POA: Diagnosis not present

## 2018-08-10 DIAGNOSIS — H10023 Other mucopurulent conjunctivitis, bilateral: Secondary | ICD-10-CM | POA: Insufficient documentation

## 2018-08-10 DIAGNOSIS — Y9389 Activity, other specified: Secondary | ICD-10-CM | POA: Diagnosis not present

## 2018-08-10 DIAGNOSIS — W07XXXA Fall from chair, initial encounter: Secondary | ICD-10-CM | POA: Diagnosis not present

## 2018-08-10 DIAGNOSIS — Z79899 Other long term (current) drug therapy: Secondary | ICD-10-CM | POA: Diagnosis not present

## 2018-08-10 DIAGNOSIS — Z8546 Personal history of malignant neoplasm of prostate: Secondary | ICD-10-CM | POA: Diagnosis not present

## 2018-08-10 DIAGNOSIS — R079 Chest pain, unspecified: Secondary | ICD-10-CM | POA: Diagnosis not present

## 2018-08-10 DIAGNOSIS — S3992XA Unspecified injury of lower back, initial encounter: Secondary | ICD-10-CM | POA: Diagnosis present

## 2018-08-10 DIAGNOSIS — Y998 Other external cause status: Secondary | ICD-10-CM | POA: Insufficient documentation

## 2018-08-10 DIAGNOSIS — N289 Disorder of kidney and ureter, unspecified: Secondary | ICD-10-CM

## 2018-08-10 DIAGNOSIS — Y929 Unspecified place or not applicable: Secondary | ICD-10-CM | POA: Diagnosis not present

## 2018-08-10 DIAGNOSIS — Z7901 Long term (current) use of anticoagulants: Secondary | ICD-10-CM | POA: Diagnosis not present

## 2018-08-10 DIAGNOSIS — I130 Hypertensive heart and chronic kidney disease with heart failure and stage 1 through stage 4 chronic kidney disease, or unspecified chronic kidney disease: Secondary | ICD-10-CM | POA: Insufficient documentation

## 2018-08-10 DIAGNOSIS — R600 Localized edema: Secondary | ICD-10-CM | POA: Insufficient documentation

## 2018-08-10 DIAGNOSIS — N182 Chronic kidney disease, stage 2 (mild): Secondary | ICD-10-CM | POA: Insufficient documentation

## 2018-08-10 DIAGNOSIS — Z8673 Personal history of transient ischemic attack (TIA), and cerebral infarction without residual deficits: Secondary | ICD-10-CM | POA: Insufficient documentation

## 2018-08-10 DIAGNOSIS — Z794 Long term (current) use of insulin: Secondary | ICD-10-CM | POA: Diagnosis not present

## 2018-08-10 DIAGNOSIS — R609 Edema, unspecified: Secondary | ICD-10-CM

## 2018-08-10 DIAGNOSIS — S39012A Strain of muscle, fascia and tendon of lower back, initial encounter: Secondary | ICD-10-CM | POA: Diagnosis not present

## 2018-08-10 DIAGNOSIS — I5042 Chronic combined systolic (congestive) and diastolic (congestive) heart failure: Secondary | ICD-10-CM | POA: Insufficient documentation

## 2018-08-10 LAB — URINALYSIS, ROUTINE W REFLEX MICROSCOPIC
Bacteria, UA: NONE SEEN
Bilirubin Urine: NEGATIVE
Glucose, UA: NEGATIVE mg/dL
Hgb urine dipstick: NEGATIVE
Ketones, ur: NEGATIVE mg/dL
Leukocytes,Ua: NEGATIVE
Nitrite: NEGATIVE
Protein, ur: 100 mg/dL — AB
Specific Gravity, Urine: 1.021 (ref 1.005–1.030)
pH: 5 (ref 5.0–8.0)

## 2018-08-10 LAB — COMPREHENSIVE METABOLIC PANEL
ALT: 51 U/L — ABNORMAL HIGH (ref 0–44)
AST: 56 U/L — ABNORMAL HIGH (ref 15–41)
Albumin: 3.6 g/dL (ref 3.5–5.0)
Alkaline Phosphatase: 81 U/L (ref 38–126)
Anion gap: 12 (ref 5–15)
BUN: 19 mg/dL (ref 8–23)
CO2: 22 mmol/L (ref 22–32)
Calcium: 9.7 mg/dL (ref 8.9–10.3)
Chloride: 103 mmol/L (ref 98–111)
Creatinine, Ser: 1.48 mg/dL — ABNORMAL HIGH (ref 0.61–1.24)
GFR calc Af Amer: 57 mL/min — ABNORMAL LOW (ref >60)
GFR calc non Af Amer: 49 mL/min — ABNORMAL LOW (ref >60)
Glucose, Bld: 131 mg/dL — ABNORMAL HIGH (ref 70–99)
Potassium: 3.9 mmol/L (ref 3.5–5.1)
Sodium: 137 mmol/L (ref 135–145)
Total Bilirubin: 2 mg/dL — ABNORMAL HIGH (ref 0.3–1.2)
Total Protein: 7.4 g/dL (ref 6.5–8.1)

## 2018-08-10 LAB — CBC WITH DIFFERENTIAL/PLATELET
Abs Immature Granulocytes: 0.03 10*3/uL (ref 0.00–0.07)
Basophils Absolute: 0 10*3/uL (ref 0.0–0.1)
Basophils Relative: 1 %
Eosinophils Absolute: 0 10*3/uL (ref 0.0–0.5)
Eosinophils Relative: 1 %
HCT: 46.5 % (ref 39.0–52.0)
Hemoglobin: 14.2 g/dL (ref 13.0–17.0)
Immature Granulocytes: 1 %
Lymphocytes Relative: 21 %
Lymphs Abs: 1.1 10*3/uL (ref 0.7–4.0)
MCH: 30.9 pg (ref 26.0–34.0)
MCHC: 30.5 g/dL (ref 30.0–36.0)
MCV: 101.3 fL — ABNORMAL HIGH (ref 80.0–100.0)
Monocytes Absolute: 0.8 10*3/uL (ref 0.1–1.0)
Monocytes Relative: 15 %
Neutro Abs: 3.4 10*3/uL (ref 1.7–7.7)
Neutrophils Relative %: 61 %
Platelets: 209 10*3/uL (ref 150–400)
RBC: 4.59 MIL/uL (ref 4.22–5.81)
RDW: 17.4 % — ABNORMAL HIGH (ref 11.5–15.5)
WBC: 5.5 10*3/uL (ref 4.0–10.5)
nRBC: 0 % (ref 0.0–0.2)

## 2018-08-10 LAB — POCT I-STAT EG7
Acid-base deficit: 3 mmol/L — ABNORMAL HIGH (ref 0.0–2.0)
Bicarbonate: 23.9 mmol/L (ref 20.0–28.0)
Calcium, Ion: 1.29 mmol/L (ref 1.15–1.40)
HCT: 47 % (ref 39.0–52.0)
Hemoglobin: 16 g/dL (ref 13.0–17.0)
O2 Saturation: 79 %
Potassium: 4 mmol/L (ref 3.5–5.1)
Sodium: 139 mmol/L (ref 135–145)
TCO2: 25 mmol/L (ref 22–32)
pCO2, Ven: 49 mmHg (ref 44.0–60.0)
pH, Ven: 7.295 (ref 7.250–7.430)
pO2, Ven: 48 mmHg — ABNORMAL HIGH (ref 32.0–45.0)

## 2018-08-10 LAB — BRAIN NATRIURETIC PEPTIDE: B Natriuretic Peptide: 440.1 pg/mL — ABNORMAL HIGH (ref 0.0–100.0)

## 2018-08-10 LAB — CBG MONITORING, ED: Glucose-Capillary: 136 mg/dL — ABNORMAL HIGH (ref 70–99)

## 2018-08-10 LAB — LIPASE, BLOOD: Lipase: 32 U/L (ref 11–51)

## 2018-08-10 MED ORDER — FUROSEMIDE 10 MG/ML IJ SOLN
60.0000 mg | Freq: Once | INTRAMUSCULAR | Status: AC
  Administered 2018-08-10: 60 mg via INTRAVENOUS
  Filled 2018-08-10: qty 6

## 2018-08-10 MED ORDER — ACETAMINOPHEN 325 MG PO TABS
650.0000 mg | ORAL_TABLET | Freq: Once | ORAL | Status: AC
  Administered 2018-08-10: 650 mg via ORAL
  Filled 2018-08-10: qty 2

## 2018-08-10 MED ORDER — ERYTHROMYCIN 5 MG/GM OP OINT
1.0000 "application " | TOPICAL_OINTMENT | Freq: Three times a day (TID) | OPHTHALMIC | Status: DC
  Administered 2018-08-10: 1 via OPHTHALMIC
  Filled 2018-08-10: qty 3.5

## 2018-08-10 NOTE — ED Triage Notes (Signed)
Pt presents to ED by [facility] from home. Pt c/o falling 3 days ago worsening pain in his side. Pt reports drinking 5th of alcohol tonight. Pt also c/o "fluid everywhere." Obvious generalized edema.

## 2018-08-10 NOTE — ED Notes (Signed)
Patient verbalizes understanding of discharge instructions. Opportunity for questioning and answers were provided. Armband removed by staff, pt discharged from ED.  

## 2018-08-10 NOTE — ED Provider Notes (Signed)
The patient reevaluated, discussed with the patient his lab findings including his elevated creatinine, he has diuresed, he appears stable for discharge, at this time the patient is stable and may return as needed.  He agrees and expresses understanding.  He has a follow-up with the CHF clinic within several weeks, I think this is reasonable in a reasonable timeframe to have his kidney function rechecked.  He is agreeable to that.   Noemi Chapel, MD 08/10/18 [phone]

## 2018-08-10 NOTE — ED Provider Notes (Signed)
[facility] EMERGENCY DEPARTMENT Provider Note  CSN: [id number] Arrival date & time: 08/10/18 0425  Chief Complaint(s) Flank Pain (Right) and Leg Swelling  HPI [name] [name] is a 63 y.o. male with extensive past medical history listed below including atrial fibrillation on Xarelto, heart failure on torsemide, history of polysubstance abuse and chronic alcoholism who presents to the emergency department with right flank pain.  He reports that 3 days ago he slipped and fell from a chair.  Reports no injury at that time but yesterday began having right-sided flank pain.  He denies any chest pain or shortness of breath.  No abdominal pain.  No nausea or vomiting.  He does endorse chronic peripheral edema. No bladder/bowel incontinence. No lower extremity weakness or LOS. No other physical complaints.  HPI  Past Medical History Past Medical History:  Diagnosis Date   Atrial fibrillation ([place])    Cancer ([place])    Prostate cancer-bx. 3 weeks ago   Cataract    Chronic combined systolic and diastolic CHF (congestive heart failure) ([place])    a) EF 40-45% per 2D echo (02/2012) with grade 1 DD b)  NICM c) RHC (04/2012): RA: 4, RV 45/3/4, PA 42/9 (24), PCWP 14, Fick CO/CI: 5.2 /2.2, PVR 1.9 WU, PA 62% and 64% d) ECHO (10/2012) EF 40-45%, grade II DD, RV nl   CKD (chronic kidney disease) stage 2, GFR 60-89 ml/min    BL SCr approximately 1-1.3   Colitis 05/2009   History of colitis of ascending colon noted on CT abd/pelvis (05/2009), with interval resolution with subsequent CT   Continuous chronic alcoholism (HCC)    Degenerative lumbar spinal stenosis    s/p L2-3, L3-4, L4-5 laminectomy partial facetectomy, and bilateral foraminotomy   Diabetes mellitus without complication ([place])    Type II   Dysrhythmia    A. Fib   Family history of adverse reaction to anesthesia    "sister, can't go to sleep"   GERD (gastroesophageal reflux disease)    H/O cocaine abuse ([place])      Hepatic steatosis    suspected 2/2 alcohol abuse   Hepatitis C    History of pancreatitis 01/2011   Admission for acute pancreatitis presumed 2/2 ongoing alcohol abuse- and hypertriglyceridemia   History of pneumonia    HIT (heparin-induced thrombocytopenia) (HCC)    Hyperlipidemia    Hypertension    uncontrolled with medication noncompliance   Hypertriglyceridemia    NICM (nonischemic cardiomyopathy) ([place])    a. LHC (04/2012): nl arteries   PFO (patent foramen ovale) 01/2012   with right to left shunt, noted per TEE in evaluation for source of embolic stroke in 02/4578   Rhabdomyolysis 02/22/2012   H/O rhabdomyolysis in 01/2012 that was idiopathic, cause never identified   Schizophrenia, schizo-affective ([place])    Shortness of breath dyspnea    Splenic cyst    Stroke ([place]) 01/2012   Small cerebellar infarcts right greater than left as well as questionable acute left external capsule and caudate nuclear punctate lacunar infarcts noted per MRI (01/2012) - presumed to be embolic likely source PFO with right to left shunt (noted per TEE 01/ 2014)   Patient Active Problem List   Diagnosis Date Noted   Diabetic ulcer of lower leg ([place]) 02/13/2018   Heart failure ([place]) 02/09/2018   Acute on chronic systolic CHF (congestive heart failure) ([place]) 02/08/2018   Acute on chronic respiratory failure with hypoxia ([place]) 12/28/2017   Gout 12/28/2017   1st degree  AV block 12/28/2017   Physical deconditioning 12/28/2017   Acute exacerbation of CHF (congestive heart failure) ([place]) 12/27/2017   Schizophrenia, schizo-affective ([place])    Syncope, vasovagal 10/22/2017   Syncope 10/22/2017   Stasis dermatitis of both legs 09/07/2017   Hypotension 05/31/2017   DKA (diabetic ketoacidoses) ([place]) 05/27/2017   CKD (chronic kidney disease), stage II 05/27/2017   Stroke ([place]) 05/27/2017   Acute encephalopathy    Chronic combined systolic (congestive) and diastolic  (congestive) heart failure ([place]) 05/12/2017   Elevated troponin 05/12/2017   Hyperosmolar non-ketotic state in patient with type 2 diabetes mellitus ([place]) 05/12/2017   Dehydration 05/12/2017   Nonketotic hyperglycinemia, type II ([place]) 05/12/2017   AKI (acute kidney injury) ([place])    Chest pain 11/27/2016   SOB (shortness of breath) 11/26/2016   Type 2 diabetes mellitus ([place])    Overgrown toenails 09/10/2015   Heart failure with reduced ejection fraction, NYHA class III (HCC) 06/07/2015   Hepatitis C antibody test positive 06/07/2015   AF (paroxysmal atrial fibrillation) ([place]) 06/07/2015   Schizophrenia ([place]) 03/06/2014   Alcohol abuse 09/20/2013   Generalized weakness 08/04/2013   Cocaine abuse ([place]) 08/01/2013   Carpal tunnel syndrome 04/11/2013   Chronic back pain greater than 3 months duration 07/19/2012   Gastric ulcer with hemorrhage 06/25/2012   CKD (chronic kidney disease) stage 2, GFR 60-89 ml/min    CVA (cerebral infarction) 02/29/2012   Transaminitis 02/21/2012   HTN (hypertension) 02/20/2012   HLD (hyperlipidemia) 05/06/2007   Home Medication(s) Prior to Admission medications   Medication Sig Start Date End Date Taking? Authorizing Provider  ACCU-CHEK FASTCLIX LANCETS MISC Check blood sugar 4 times a day before meals and bedtime 02/16/17   [name], MD  allopurinol (ZYLOPRIM) 100 MG tablet Take 1 tablet (100 mg total) by mouth daily. 09/14/17   Regalado, Belkys A, MD  amiodarone (PACERONE) 100 MG tablet Take 1 tablet (100 mg total) by mouth daily. 04/04/18   Shirley Friar, PA-C  B-D UF III MINI PEN NEEDLES 31G X 5 MM MISC Use four times daily as directed. Dx code E11.00, Z79.4 12/23/16   Aldine Contes, MD  BIDIL 20-37.5 MG tablet TAKE 1 TABLET BY MOUTH 3 (THREE) TIMES DAILY. 07/22/18   Bensimhon, Shaune Pascal, MD  Blood Glucose Monitoring Suppl (ACCU-CHEK GUIDE) w/Device KIT 1 each by Does not apply route 4 (four) times daily. 02/16/17    [name], MD  digoxin (LANOXIN) 0.125 MG tablet Take 1 tablet (0.125 mg total) by mouth daily. 04/18/18   Bensimhon, Shaune Pascal, MD  ENTRESTO 24-26 MG TAKE 1 TABLET BY MOUTH TWICE DAILY STOP TAKING LOSARTAN 05/25/18   Bensimhon, Shaune Pascal, MD  eplerenone (INSPRA) 25 MG tablet Take 1 tablet (25 mg total) by mouth at bedtime. 04/18/18   Bensimhon, Shaune Pascal, MD  glucose blood (ACCU-CHEK GUIDE) test strip Check blood sugar 4 times a day before meal and bedtime 02/16/17   [name], MD  insulin glargine (LANTUS) 100 UNIT/ML injection Inject 0.2 mLs (20 Units total) into the skin at bedtime for 30 days. 02/11/18 03/28/18  Arrien, Jimmy Picket, MD  insulin lispro (HUMALOG) 100 UNIT/ML injection Inject 15 Units into the skin 3 (three) times daily before meals.    [provider]  metFORMIN (GLUCOPHAGE-XR) 500 MG 24 hr tablet Take 500 mg by mouth daily. 01/31/18   [provider]  OZEMPIC, 0.25 OR 0.5 MG/DOSE, 2 MG/1.5ML SOPN Inject 0.25 mg into the skin once a week. 01/31/18  [provider]  potassium chloride SA (K-DUR,KLOR-CON) 20 MEQ tablet Take 1 tablet (20 mEq total) by mouth daily. 04/18/18   Bensimhon, Shaune Pascal, MD  QUEtiapine (SEROQUEL) 400 MG tablet Take 1 tablet (400 mg total) by mouth 2 (two) times daily for 21 days. Patient taking differently: Take 800 mg by mouth at bedtime.  06/03/17 03/28/18  Elwin Mocha, MD  rivaroxaban (XARELTO) 20 MG TABS tablet TAKE 1 TABLET(20 MG) BY MOUTH DAILY 04/18/18   Bensimhon, Shaune Pascal, MD  rosuvastatin (CRESTOR) 40 MG tablet Take 1 tablet (40 mg total) by mouth daily. 07/07/17   Georgiana Shore, NP  torsemide (DEMADEX) 20 MG tablet Take 58m (4 tablets) each morning. Take 460m(2 tablets) each evening. 04/22/18   Bensimhon, DaShaune PascalMD                                                                                                                                    Past Surgical History Past Surgical History:  Procedure Laterality  Date   ACHILLES TENDON REPAIR Right 2007   "it was torn"   BACK SURGERY     CARDIAC CATHETERIZATION N/A 09/02/2015   Procedure: Right Heart Cath;  Surgeon: DaJolaine ArtistMD;  [facility];  Service: Cardiovascular;  Laterality: N/A;   CATARACT EXTRACTION W/ INTRAOCULAR LENS IMPLANT Right    ESOPHAGOGASTRODUODENOSCOPY N/A 06/25/2012   Procedure: ESOPHAGOGASTRODUODENOSCOPY (EGD);  Surgeon: DaMilus BanisterMD;  [facility] Service: Endoscopy;  Laterality: N/A;   I&D EXTREMITY  03/20/2011   Procedure: IRRIGATION AND DEBRIDEMENT EXTREMITY;  Surgeon: FrKerin SalenMD;  [facility] Service: Orthopedics;  Laterality: Left;  I&D LEFT ACHILLIES TENDON   KNEE ARTHROSCOPY Left    LUMBAR LAMINECTOMY     L2-3, L3-4, L4-5 laminectomy, partial facetectomy   RESECTION DISTAL CLAVICAL  09/17/2011   Procedure: RESECTION DISTAL CLAVICAL;  Surgeon: JuNita SellsMD;  Location: [facility] Service: Orthopedics;  Laterality: Right;  right shoulder arthroscopy with sad and open distal clavicle excision    ROBOT ASSISTED LAPAROSCOPIC RADICAL PROSTATECTOMY N/A 12/16/2012   Procedure: ROBOTIC ASSISTED LAPAROSCOPIC PROSTATECTOMY ;  Surgeon: BeArdis HughsMD;  Location: [facility];  Service: Urology;  Laterality: N/A;   TONSILLECTOMY     Family History Family History  Problem Relation Age of Onset   CAD Mother 4064     deceased   CAD Sister    CAD Brother 3973     died from MI at age 64yo Hypertension Other     Social History Social History   Tobacco Use   Smoking status: Former Smoker    Packs/day: 1.00    Years: 30.00    Pack years: 30.00    Types: Cigarettes    Quit date: 06/24/2001    Years since quitting: 17.1   Smokeless  tobacco: Never Used  Substance Use Topics   Alcohol use: Yes    Alcohol/week: 6.0 standard drinks    Types: 6 Glasses of wine per week    Comment: 5th of liquor daily   Drug use: Yes     Types: Cocaine, "Crack" cocaine   Allergies Heparin, Spironolactone, and Thorazine [chlorpromazine hcl]  Review of Systems Review of Systems All other systems are reviewed and are negative for acute change except as noted in the HPI  Physical Exam Vital Signs  I have reviewed the triage vital signs BP (!) 126/106    Pulse (!) 109    Temp 98.2 F (36.8 C) (Oral)    Resp 20    Ht 6' (1.829 m)    Wt 131.5 kg    SpO2 96%    BMI 39.33 kg/m   Physical Exam Vitals signs reviewed.  Constitutional:      General: He is not in acute distress.    Appearance: He is well-developed. He is not diaphoretic.  HENT:     Head: Normocephalic and atraumatic.     Nose: Nose normal.  Eyes:     General: No scleral icterus.       Right eye: Discharge present. No hordeolum.        Left eye: Discharge present.No hordeolum.     Conjunctiva/sclera: Conjunctivae normal.     Pupils: Pupils are equal, round, and reactive to light.  Neck:     Musculoskeletal: Normal range of motion and neck supple.  Cardiovascular:     Rate and Rhythm: Normal rate and regular rhythm.     Heart sounds: No murmur. No friction rub. No gallop.   Pulmonary:     Effort: Pulmonary effort is normal. No respiratory distress.     Breath sounds: Normal breath sounds. No stridor. No rales.  Abdominal:     General: There is no distension.     Palpations: Abdomen is soft.     Tenderness: There is no abdominal tenderness.    Musculoskeletal:        General: No tenderness.  Skin:    General: Skin is warm and dry.     Findings: No erythema or rash.  Neurological:     Mental Status: He is alert and oriented to person, place, and time.     ED Results and Treatments Labs (all labs ordered are listed, but only abnormal results are displayed) Labs Reviewed  CBC WITH DIFFERENTIAL/PLATELET - Abnormal; Notable for the following components:      Result Value   MCV 101.3 (*)    RDW 17.4 (*)    All other components within normal  limits  BRAIN NATRIURETIC PEPTIDE - Abnormal; Notable for the following components:   B Natriuretic Peptide 440.1 (*)    All other components within normal limits  COMPREHENSIVE METABOLIC PANEL - Abnormal; Notable for the following components:   Glucose, Bld 131 (*)    Creatinine, Ser 1.48 (*)    AST 56 (*)    ALT 51 (*)    Total Bilirubin 2.0 (*)    GFR calc non Af Amer 49 (*)    GFR calc Af Amer 57 (*)    All other components within normal limits  URINALYSIS, ROUTINE W REFLEX MICROSCOPIC - Abnormal; Notable for the following components:   Color, Urine AMBER (*)    APPearance HAZY (*)    Protein, ur 100 (*)    All other components within normal limits  CBG MONITORING, ED -  Abnormal; Notable for the following components:   Glucose-Capillary 136 (*)    All other components within normal limits  POCT I-STAT EG7 - Abnormal; Notable for the following components:   pO2, Ven 48.0 (*)    Acid-base deficit 3.0 (*)    All other components within normal limits  LIPASE, BLOOD                                                                                                                         EKG ED ECG REPORT   Date: 08/10/2018  Rate: 104  Rhythm: sinus tachycardia  QRS Axis: normal  Intervals: normal  ST/T Wave abnormalities: normal  Conduction Disutrbances:nonspecific intraventricular conduction delay  Narrative Interpretation: PVCs Old EKG Reviewed: changes noted  I have personally reviewed the EKG tracing and agree with the computerized printout as noted.    Radiology Dg Chest Portable 1 View  Result Date: 08/10/2018 CLINICAL DATA:  Chest pain.  Generalized edema. EXAM: PORTABLE CHEST 1 VIEW COMPARISON:  02/13/2018 FINDINGS: Cardiomegaly and vascular pedicle widening. Low volume chest with interstitial prominence that is similar to prior, although there could be some early airspace opacity at the left upper lobe. No effusion or pneumothorax. IMPRESSION: No definite acute  disease when compared with prior. Subtle asymmetric left perihilar density is likely from low volumes, but consider follow-up if there is symptoms of pneumonia. Electronically Signed   By: Monte Fantasia M.D.   On: 08/10/2018 06:30    Pertinent labs & imaging results that were available during my care of the patient were reviewed by me and considered in my medical decision making (see chart for details).  Medications Ordered in ED Medications  erythromycin ophthalmic ointment 1 application (1 application Both Eyes Given 08/10/18 0657)  acetaminophen (TYLENOL) tablet 650 mg (650 mg Oral Given 08/10/18 0533)  furosemide (LASIX) injection 60 mg (60 mg Intravenous Given 08/10/18 1610)                                                                                                                                    Procedures Procedures  (including critical care time)  Medical Decision Making / ED Course I have reviewed the nursing notes for this encounter and the patient's prior records (if available in EHR or on provided paperwork).   [name] [name] was evaluated in Emergency Department on 08/10/2018 for the symptoms described in the  history of present illness. He was evaluated in the context of the global COVID-19 pandemic, which necessitated consideration that the patient might be at risk for infection with the SARS-CoV-2 virus that causes COVID-19. Institutional protocols and algorithms that pertain to the evaluation of patients at risk for COVID-19 are in a state of rapid change based on information released by regulatory bodies including the CDC and federal and state organizations. These policies and algorithms were followed during the patient's care in the ED.  Patient presents with right-sided flank pain.  Mild tenderness to palpation over the paraspinal musculature.  No abdominal tenderness.  UA without evidence of infection.  On review of systems recent CT without aortic aneurysm does  have a low suspicion for AAA.   No lower extremity weakness or loss of sensation concerning for cauda equina.  No bony tenderness concerning for fracture dislocation.  Patient does have peripheral edema but this is chronic.  No respiratory distress, no pulmonary edema on exam.  Patient initially needed 2 L nasal cannula for sats of 89%.  Will give IV Lasix and allow patient to diuresis.    Prior to Lasix being given, reassessed the patient and he was satting well on room air with sats of 95%.   On exam patient noted to have conjunctival discharge.  Given erythromycin.  No signs of preseptal cellulitis.  After diuresing, feel patient would be appropriate for discharge home with close PCP/cardiology follow-up.        Final Clinical Impression(s) / ED Diagnoses Final diagnoses:  Chest pain  Peripheral edema  Other mucopurulent conjunctivitis of both eyes  Strain of lumbar region, initial encounter      This chart was dictated using voice recognition software.  Despite best efforts to proofread,  errors can occur which can change the documentation meaning.   Fatima Blank, MD 08/10/18 [phone]

## 2018-08-10 NOTE — Discharge Instructions (Addendum)
Continue taking your home torsemide.  Follow-up closely with your primary care provider/cardiologist.  You may use over-the-counter Motrin (Ibuprofen), Acetaminophen (Tylenol), topical muscle creams such as SalonPas, [employer], Bengay, etc. Please stretch, apply heat, and have massage therapy for additional assistance.  Please have your family doctor or cardiologist recheck your kidney function within 2 to 3 weeks.  Your creatinine today was 1.48

## 2018-09-02 ENCOUNTER — Encounter (HOSPITAL_BASED_OUTPATIENT_CLINIC_OR_DEPARTMENT_OTHER): Payer: Medicare Other | Attending: Internal Medicine

## 2018-09-02 ENCOUNTER — Other Ambulatory Visit: Payer: Self-pay

## 2018-09-02 ENCOUNTER — Encounter (HOSPITAL_COMMUNITY): Payer: Medicare Other

## 2018-09-02 DIAGNOSIS — E11622 Type 2 diabetes mellitus with other skin ulcer: Secondary | ICD-10-CM | POA: Diagnosis not present

## 2018-09-02 DIAGNOSIS — L97212 Non-pressure chronic ulcer of right calf with fat layer exposed: Secondary | ICD-10-CM | POA: Diagnosis not present

## 2018-09-02 DIAGNOSIS — N186 End stage renal disease: Secondary | ICD-10-CM | POA: Insufficient documentation

## 2018-09-02 DIAGNOSIS — I509 Heart failure, unspecified: Secondary | ICD-10-CM | POA: Diagnosis not present

## 2018-09-02 DIAGNOSIS — L97822 Non-pressure chronic ulcer of other part of left lower leg with fat layer exposed: Secondary | ICD-10-CM | POA: Diagnosis not present

## 2018-09-02 DIAGNOSIS — Z794 Long term (current) use of insulin: Secondary | ICD-10-CM | POA: Insufficient documentation

## 2018-09-02 DIAGNOSIS — E1122 Type 2 diabetes mellitus with diabetic chronic kidney disease: Secondary | ICD-10-CM | POA: Diagnosis not present

## 2018-09-02 DIAGNOSIS — Z7901 Long term (current) use of anticoagulants: Secondary | ICD-10-CM | POA: Insufficient documentation

## 2018-09-02 DIAGNOSIS — Z87891 Personal history of nicotine dependence: Secondary | ICD-10-CM | POA: Diagnosis not present

## 2018-09-02 DIAGNOSIS — I429 Cardiomyopathy, unspecified: Secondary | ICD-10-CM | POA: Diagnosis not present

## 2018-09-02 DIAGNOSIS — I872 Venous insufficiency (chronic) (peripheral): Secondary | ICD-10-CM | POA: Diagnosis not present

## 2018-09-02 DIAGNOSIS — I132 Hypertensive heart and chronic kidney disease with heart failure and with stage 5 chronic kidney disease, or end stage renal disease: Secondary | ICD-10-CM | POA: Diagnosis not present

## 2018-09-09 DIAGNOSIS — E11622 Type 2 diabetes mellitus with other skin ulcer: Secondary | ICD-10-CM | POA: Diagnosis not present

## 2018-09-14 ENCOUNTER — Other Ambulatory Visit (HOSPITAL_COMMUNITY): Payer: Self-pay | Admitting: Student

## 2018-09-15 ENCOUNTER — Encounter (HOSPITAL_COMMUNITY): Payer: Self-pay

## 2018-09-15 ENCOUNTER — Ambulatory Visit (HOSPITAL_COMMUNITY)
Admission: RE | Admit: 2018-09-15 | Discharge: 2018-09-15 | Disposition: A | Discharge status: Home / Self Care | Payer: Medicare Other | Source: Ambulatory Visit | Attending: Internal Medicine | Admitting: Internal Medicine

## 2018-09-15 ENCOUNTER — Other Ambulatory Visit: Payer: Self-pay

## 2018-09-15 VITALS — BP 144/108 | HR 101 | Wt 290.0 lb

## 2018-09-15 DIAGNOSIS — Z8249 Family history of ischemic heart disease and other diseases of the circulatory system: Secondary | ICD-10-CM | POA: Insufficient documentation

## 2018-09-15 DIAGNOSIS — Z8673 Personal history of transient ischemic attack (TIA), and cerebral infarction without residual deficits: Secondary | ICD-10-CM | POA: Diagnosis not present

## 2018-09-15 DIAGNOSIS — N182 Chronic kidney disease, stage 2 (mild): Secondary | ICD-10-CM

## 2018-09-15 DIAGNOSIS — M48061 Spinal stenosis, lumbar region without neurogenic claudication: Secondary | ICD-10-CM | POA: Insufficient documentation

## 2018-09-15 DIAGNOSIS — F1010 Alcohol abuse, uncomplicated: Secondary | ICD-10-CM | POA: Diagnosis not present

## 2018-09-15 DIAGNOSIS — Z7901 Long term (current) use of anticoagulants: Secondary | ICD-10-CM | POA: Insufficient documentation

## 2018-09-15 DIAGNOSIS — I130 Hypertensive heart and chronic kidney disease with heart failure and stage 1 through stage 4 chronic kidney disease, or unspecified chronic kidney disease: Secondary | ICD-10-CM | POA: Insufficient documentation

## 2018-09-15 DIAGNOSIS — Z87891 Personal history of nicotine dependence: Secondary | ICD-10-CM | POA: Diagnosis not present

## 2018-09-15 DIAGNOSIS — I491 Atrial premature depolarization: Secondary | ICD-10-CM | POA: Diagnosis not present

## 2018-09-15 DIAGNOSIS — E1122 Type 2 diabetes mellitus with diabetic chronic kidney disease: Secondary | ICD-10-CM | POA: Diagnosis not present

## 2018-09-15 DIAGNOSIS — R9431 Abnormal electrocardiogram [ECG] [EKG]: Secondary | ICD-10-CM | POA: Insufficient documentation

## 2018-09-15 DIAGNOSIS — R000 Tachycardia, unspecified: Secondary | ICD-10-CM | POA: Insufficient documentation

## 2018-09-15 DIAGNOSIS — E785 Hyperlipidemia, unspecified: Secondary | ICD-10-CM | POA: Diagnosis not present

## 2018-09-15 DIAGNOSIS — F1411 Cocaine abuse, in remission: Secondary | ICD-10-CM | POA: Insufficient documentation

## 2018-09-15 DIAGNOSIS — R0683 Snoring: Secondary | ICD-10-CM | POA: Diagnosis not present

## 2018-09-15 DIAGNOSIS — I480 Paroxysmal atrial fibrillation: Secondary | ICD-10-CM

## 2018-09-15 DIAGNOSIS — Z8546 Personal history of malignant neoplasm of prostate: Secondary | ICD-10-CM | POA: Insufficient documentation

## 2018-09-15 DIAGNOSIS — E1136 Type 2 diabetes mellitus with diabetic cataract: Secondary | ICD-10-CM | POA: Insufficient documentation

## 2018-09-15 DIAGNOSIS — I5042 Chronic combined systolic (congestive) and diastolic (congestive) heart failure: Secondary | ICD-10-CM | POA: Insufficient documentation

## 2018-09-15 DIAGNOSIS — Z794 Long term (current) use of insulin: Secondary | ICD-10-CM | POA: Diagnosis not present

## 2018-09-15 DIAGNOSIS — N183 Chronic kidney disease, stage 3 (moderate): Secondary | ICD-10-CM | POA: Insufficient documentation

## 2018-09-15 DIAGNOSIS — F1410 Cocaine abuse, uncomplicated: Secondary | ICD-10-CM | POA: Diagnosis not present

## 2018-09-15 DIAGNOSIS — F1011 Alcohol abuse, in remission: Secondary | ICD-10-CM | POA: Insufficient documentation

## 2018-09-15 DIAGNOSIS — E781 Pure hyperglyceridemia: Secondary | ICD-10-CM | POA: Insufficient documentation

## 2018-09-15 DIAGNOSIS — I428 Other cardiomyopathies: Secondary | ICD-10-CM | POA: Insufficient documentation

## 2018-09-15 LAB — BASIC METABOLIC PANEL
Anion gap: 9 (ref 5–15)
BUN: 17 mg/dL (ref 8–23)
CO2: 26 mmol/L (ref 22–32)
Calcium: 10.1 mg/dL (ref 8.9–10.3)
Chloride: 104 mmol/L (ref 98–111)
Creatinine, Ser: 1.12 mg/dL (ref 0.61–1.24)
GFR calc Af Amer: >60 mL/min (ref >60)
GFR calc non Af Amer: >60 mL/min (ref >60)
Glucose, Bld: 104 mg/dL — ABNORMAL HIGH (ref 70–99)
Potassium: 4.2 mmol/L (ref 3.5–5.1)
Sodium: 139 mmol/L (ref 135–145)

## 2018-09-15 MED ORDER — TORSEMIDE 20 MG PO TABS: ORAL_TABLET | ORAL | 6 refills | Status: DC

## 2018-09-15 NOTE — Patient Instructions (Signed)
Labs done today. We will contact you only if your labs are abnormal.   INCREASE Torsemide afternoon dose to 60mg (3 tablets). CONTINUE to take the 80mg (4 tablets) in the morning.   Your physician recommends that you schedule a follow-up appointment in: 6-8 weeks with Amy Clegg,NP.  At the [facility], you and your health needs are our priority. As part of our continuing mission to provide you with exceptional heart care, we have created designated Provider Care Teams. These Care Teams include your primary Cardiologist (physician) and Advanced Practice Providers (APPs- Physician Assistants and Nurse Practitioners) who all work together to provide you with the care you need, when you need it.   You may see any of the following providers on your designated Care Team at your next follow up: Marland Kitchen Dr Glori Bickers . Dr Loralie Champagne . Darrick Grinder, NP   Please be sure to bring in all your medications bottles to every appointment.

## 2018-09-15 NOTE — Progress Notes (Signed)
NTI:[id number], [name], MD   Primary Cardiologist: Dr [name] [name]   HPI: [name] [name] is a 63 y.o. 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 Had history of acute renal failure in setting of rhabdo requiring short term HD.   Admitted 8/13 - 8/19 with A/C CHF. Diuresed with IV lasix and metolazone for a total of 26 lbs down. Restarted on Entresto and Torsemide 80 mg daily for home. Discharge weight 282 lbs.   Admitted 9/27 - 9/29 after fall secondary to cocaine and ETOH binge. MRI showed acute CVA in setting of not taking his Xarelto.  Admitted1/19 - 02/19/2018 with low output CHF. Was transiently on milrinone, but weaned off prior to discharge.   Today he returns for HF follow up. Overall feeling fair. Remains SOB with exertion. Denies PND/Orthopnea. Appetite ok. No fever or chills. He is not weighing. Taking all medications. Drinking 1/5 liquor per week. Says he hasnt used cocaine this week. Marland Kitchen   ECHO 10/27/12 EF 40-45%  ECHO 06/04/15 LVEF 20-25%, decreased diastolic compliance, LAE, RV severely reduced.  ECHO 11/26/15 EF 30%  Echo 12/26/15 EF 30-35% RV ok.  ECHO 05/31/17 EF 25-30% Echo 09/08/17: EF 25-30%  ROS: All systems negative except as listed in HPI, PMH and Problem List.  SH:  Social History   Socioeconomic History  . Marital status: Single    Spouse name: Not on file  . Number of children: Not on file  . Years of education: 12th grade  . Highest education level: Not on file  Occupational History  . Occupation: Disability    Comment: 2/2 schizophrenia  Social Needs  . Financial resource strain: Not on file  . Food insecurity    Worry: Not on file    Inability: Not on file  . Transportation needs    Medical: Not on  file    Non-medical: Not on file  Tobacco Use  . Smoking status: Former Smoker    Packs/day: 1.00    Years: 30.00    Pack years: 30.00    Types: Cigarettes    Quit date: 06/24/2001    Years since quitting: 17.2  . Smokeless tobacco: Never Used  Substance and Sexual Activity  . Alcohol use: Yes    Alcohol/week: 6.0 standard drinks    Types: 6 Glasses of wine per week    Comment: 5th of liquor daily  . Drug use: Yes    Types: Cocaine, "Crack" cocaine  . Sexual activity: Yes  Lifestyle  . Physical activity    Days per week: Not on file    Minutes per session: Not on file  . Stress: Not on file  Relationships  . Social Herbalist on phone: Not on file    Gets together: Not on file    Attends religious service: Not on file    Active member of club or organization: Not on file    Attends meetings of clubs or organizations: Not on file    Relationship status: Not on file  . Intimate partner violence    Fear of current or ex partner: Not on file    Emotionally abused: Not on file    Physically abused: Not on file    Forced sexual activity: Not on file  Other Topics Concern  .  Not on file  Social History Narrative   Lives in [place] alone, has a [facility] aide that helps with medications 4-5 days a week with medications, helping to clean.    FH:  Family History  Problem Relation Age of Onset  . CAD Mother 64       deceased  . CAD Sister   . CAD Brother 72       died from MI at age 70yo  . Hypertension Other     Past Medical History:  Diagnosis Date  . Atrial fibrillation ([place])   . Cancer [facility])    Prostate cancer-bx. 3 weeks ago  . Cataract   . Chronic combined systolic and diastolic CHF (congestive heart failure) ([facility])    a) EF 40-45% per 2D echo (02/2012) with grade 1 DD b)  NICM c) RHC (04/2012): RA: 4, RV 45/3/4, PA 42/9 (24), PCWP 14, Fick CO/CI: 5.2 /2.2, PVR 1.9 WU, PA 62% and 64% d) ECHO (10/2012) EF 40-45%, grade II DD, RV nl  . CKD (chronic kidney  disease) stage 2, GFR 60-89 ml/min    BL SCr approximately 1-1.3  . Colitis 05/2009   History of colitis of ascending colon noted on CT abd/pelvis (05/2009), with interval resolution with subsequent CT  . Continuous chronic alcoholism ([place])   . Degenerative lumbar spinal stenosis    s/p L2-3, L3-4, L4-5 laminectomy partial facetectomy, and bilateral foraminotomy  . Diabetes mellitus without complication ([place])    Type II  . Dysrhythmia    A. Fib  . Family history of adverse reaction to anesthesia    "sister, can't go to sleep"  . GERD (gastroesophageal reflux disease)   . H/O cocaine abuse ([place])   . Hepatic steatosis    suspected 2/2 alcohol abuse  . Hepatitis C   . History of pancreatitis 01/2011   Admission for acute pancreatitis presumed 2/2 ongoing alcohol abuse- and hypertriglyceridemia  . History of pneumonia   . HIT (heparin-induced thrombocytopenia) ([place])   . Hyperlipidemia   . Hypertension    uncontrolled with medication noncompliance  . Hypertriglyceridemia   . NICM (nonischemic cardiomyopathy) ([place])    a. LHC (04/2012): nl arteries  . PFO (patent foramen ovale) 01/2012   with right to left shunt, noted per TEE in evaluation for source of embolic stroke in 01/6107  . Rhabdomyolysis 02/22/2012   H/O rhabdomyolysis in 01/2012 that was idiopathic, cause never identified  . Schizophrenia, schizo-affective ([place])   . Shortness of breath dyspnea   . Splenic cyst   . Stroke ([place]) 01/2012   Small cerebellar infarcts right greater than left as well as questionable acute left external capsule and caudate nuclear punctate lacunar infarcts noted per MRI (01/2012) - presumed to be embolic likely source PFO with right to left shunt (noted per TEE 01/ 2014)    Current Outpatient Medications  Medication Sig Dispense Refill  . ACCU-CHEK FASTCLIX LANCETS MISC Check blood sugar 4 times a day before meals and bedtime 204 each 5  . allopurinol (ZYLOPRIM) 100 MG tablet Take 1 tablet (100 mg  total) by mouth daily. 30 tablet 0  . amiodarone (PACERONE) 100 MG tablet TAKE 1 TABLET BY MOUTH DAILY 30 tablet 2  . B-D UF III MINI PEN NEEDLES 31G X 5 MM MISC Use four times daily as directed. Dx code E11.00, Z79.4 100 each 3  . BIDIL 20-37.5 MG tablet TAKE 1 TABLET BY MOUTH 3 (THREE) TIMES DAILY. 90 tablet 2  . Blood Glucose Monitoring  Suppl (ACCU-CHEK GUIDE) w/Device KIT 1 each by Does not apply route 4 (four) times daily. 1 kit 1  . digoxin (LANOXIN) 0.125 MG tablet Take 1 tablet (0.125 mg total) by mouth daily. 30 tablet 6  . ENTRESTO 24-26 MG TAKE 1 TABLET BY MOUTH TWICE DAILY STOP TAKING LOSARTAN 180 tablet 0  . eplerenone (INSPRA) 25 MG tablet Take 1 tablet (25 mg total) by mouth at bedtime. 30 tablet 6  . glucose blood (ACCU-CHEK GUIDE) test strip Check blood sugar 4 times a day before meal and bedtime 150 each 5  . insulin lispro (HUMALOG) 100 UNIT/ML injection Inject 15 Units into the skin 3 (three) times daily before meals.    . metFORMIN (GLUCOPHAGE-XR) 500 MG 24 hr tablet Take 500 mg by mouth daily.    Marland Kitchen OZEMPIC, 0.25 OR 0.5 MG/DOSE, 2 MG/1.5ML SOPN Inject 0.25 mg into the skin once a week.    . potassium chloride SA (K-DUR,KLOR-[name]) 20 MEQ tablet Take 1 tablet (20 mEq total) by mouth daily. 30 tablet 6  . rivaroxaban (XARELTO) 20 MG TABS tablet TAKE 1 TABLET(20 MG) BY MOUTH DAILY 30 tablet 6  . rosuvastatin (CRESTOR) 40 MG tablet Take 1 tablet (40 mg total) by mouth daily. 90 tablet 3  . torsemide (DEMADEX) 20 MG tablet Take '80mg'$  (4 tablets) each morning. Take '40mg'$  (2 tablets) each evening. 180 tablet 6  . insulin glargine (LANTUS) 100 UNIT/ML injection Inject 0.2 mLs (20 Units total) into the skin at bedtime for 30 days. 6 mL 0  . QUEtiapine (SEROQUEL) 400 MG tablet Take 1 tablet (400 mg total) by mouth 2 (two) times daily for 21 days. (Patient taking differently: Take 800 mg by mouth at bedtime. ) 42 tablet 0   No current facility-administered medications for this encounter.      Vitals:   09/15/18 1216  BP: (!) 144/108  Pulse: (!) 101  SpO2: 99%  Weight: 131.5 kg (290 lb)    Wt Readings from Last 3 Encounters:  09/15/18 131.5 kg (290 lb)  08/10/18 131.5 kg (290 lb)  08/01/18 132.7 kg (292 lb 9.6 oz)    PHYSICAL EXAM: General:  Appears chronically ill.  No resp difficulty HEENT: normal Neck: supple. no JVD. Carotids 2+ bilat; no bruits. No lymphadenopathy or thryomegaly appreciated. Cor: PMI nondisplaced. Regular rate & rhythm. No rubs, gallops or murmurs. Lungs: clear Abdomen: soft, nontender, nondistended. No hepatosplenomegaly. No bruits or masses. Good bowel sounds. Extremities: no cyanosis, clubbing, rash, R and LLE compression wraps.  Neuro: alert & orientedx3, cranial nerves grossly intact. moves all 4 extremities w/o difficulty. Affect pleasant  EKG: Sinus Tach 103 bpm   ASSESSMENT & PLAN: 1)Chroniccombined systolic/diastolic JQ:[id number] cardiomyopathy.EF 40-45%, grade II DD (10/2012) now decreased to 20-25% (05/2015) RV severely reduced. Echo 4/19 EF 30-35% RV ok. Had [place] 2014 with minimal CAD. Holter showed <3% PVC burden. Echo 02/15/18 withEF 20-25%, severe global HK, mild RV dysfunction.Not ICD candidate with chronic LE wounds. NYHA IIIb. Volume status elevated. Continue torsemide 80 qam and increase pm torsemide to 60 mg daily.   - Continue eplerenone 25 mg daily(breast tenderness with spiro) -No bb with cocaine abuse.  - Poor candidate for corlanor with atrial tachycardias and paroxysmal Afib -Continue Entresto 24/26 mg BID.   -Continue Bidil to 1 tab TID. - Continue digoxin 0.125 mg daily. Dig level <0.2 1/19. - Not a candidate for home milrinone or advanced therapies.  - 2) PAF: In NSR. On xarelto.  4) CKD stage III: -  Baseline creatinine1.3-1.5.  -Check BMET today.  5) Snores: 6) DMII: - Per PCP. 7) Hyperlipidemia - Continuecrestor.No change. 8) QHU:TMLYY 09/2017, likely related to atrial  fibrillation. - Continue Xarelto. 10)ETOHand cocaineabuse: Says he is looking for inpatient programs. HFSW helping him with this.    Bradyn Soward NP-C  12:22 PM

## 2018-09-16 ENCOUNTER — Other Ambulatory Visit (HOSPITAL_COMMUNITY): Payer: Self-pay | Admitting: *Deleted

## 2018-09-16 DIAGNOSIS — E11622 Type 2 diabetes mellitus with other skin ulcer: Secondary | ICD-10-CM | POA: Diagnosis not present

## 2018-09-16 MED ORDER — TORSEMIDE 20 MG PO TABS: ORAL_TABLET | ORAL | 6 refills | Status: AC

## 2018-09-20 ENCOUNTER — Other Ambulatory Visit (HOSPITAL_COMMUNITY): Payer: Self-pay | Admitting: Student

## 2018-09-23 DIAGNOSIS — E11622 Type 2 diabetes mellitus with other skin ulcer: Secondary | ICD-10-CM | POA: Diagnosis not present

## 2018-09-30 ENCOUNTER — Encounter (HOSPITAL_BASED_OUTPATIENT_CLINIC_OR_DEPARTMENT_OTHER): Payer: Medicare Other | Attending: Internal Medicine

## 2018-09-30 DIAGNOSIS — L97822 Non-pressure chronic ulcer of other part of left lower leg with fat layer exposed: Secondary | ICD-10-CM | POA: Insufficient documentation

## 2018-09-30 DIAGNOSIS — N186 End stage renal disease: Secondary | ICD-10-CM | POA: Insufficient documentation

## 2018-09-30 DIAGNOSIS — I5022 Chronic systolic (congestive) heart failure: Secondary | ICD-10-CM | POA: Insufficient documentation

## 2018-09-30 DIAGNOSIS — I132 Hypertensive heart and chronic kidney disease with heart failure and with stage 5 chronic kidney disease, or end stage renal disease: Secondary | ICD-10-CM | POA: Insufficient documentation

## 2018-09-30 DIAGNOSIS — Z794 Long term (current) use of insulin: Secondary | ICD-10-CM | POA: Insufficient documentation

## 2018-09-30 DIAGNOSIS — E1122 Type 2 diabetes mellitus with diabetic chronic kidney disease: Secondary | ICD-10-CM | POA: Insufficient documentation

## 2018-09-30 DIAGNOSIS — I872 Venous insufficiency (chronic) (peripheral): Secondary | ICD-10-CM | POA: Insufficient documentation

## 2018-10-05 DIAGNOSIS — I872 Venous insufficiency (chronic) (peripheral): Secondary | ICD-10-CM | POA: Diagnosis not present

## 2018-10-05 DIAGNOSIS — I132 Hypertensive heart and chronic kidney disease with heart failure and with stage 5 chronic kidney disease, or end stage renal disease: Secondary | ICD-10-CM | POA: Diagnosis not present

## 2018-10-05 DIAGNOSIS — I5022 Chronic systolic (congestive) heart failure: Secondary | ICD-10-CM | POA: Diagnosis not present

## 2018-10-05 DIAGNOSIS — L97822 Non-pressure chronic ulcer of other part of left lower leg with fat layer exposed: Secondary | ICD-10-CM | POA: Diagnosis not present

## 2018-10-05 DIAGNOSIS — Z794 Long term (current) use of insulin: Secondary | ICD-10-CM | POA: Diagnosis not present

## 2018-10-05 DIAGNOSIS — N186 End stage renal disease: Secondary | ICD-10-CM | POA: Diagnosis not present

## 2018-10-05 DIAGNOSIS — E1122 Type 2 diabetes mellitus with diabetic chronic kidney disease: Secondary | ICD-10-CM | POA: Diagnosis not present

## 2018-10-13 DIAGNOSIS — L97822 Non-pressure chronic ulcer of other part of left lower leg with fat layer exposed: Secondary | ICD-10-CM | POA: Diagnosis not present

## 2018-10-27 DEATH — deceased

## 2018-11-09 ENCOUNTER — Encounter (HOSPITAL_COMMUNITY): Payer: Medicare Other

## 2019-01-03 NOTE — Progress Notes (Signed)
[name], [name] ([id number]) Visit Report for 09/02/2018 Chief Complaint Document Details Patient Name: Date of Service: [name] [name], [name] [name] 09/02/2018 9:00 AM Medical Record [id number] Patient Account Number: [id number] Date of Birth/Sex: Treating RN: 07/07/1955 (63 y.o. Male) Baruch Gouty Primary Care Provider: [name] Other Clinician: Referring Provider: Treating Provider/Extender:Linley Moxley, Lurena Joiner, [name] Weeks in Treatment: 0 Information Obtained from: Patient Chief Complaint Bilateral leg wounds x 3 weeks Electronic Signature(s) Signed: 09/02/2018 11:10:25 AM By: Tobi Bastos Entered By: Tobi Bastos on 09/02/2018 11:10:25 -------------------------------------------------------------------------------- HPI Details Patient Name: Date of Service: [name] [name] 09/02/2018 9:00 AM Medical Record [id number] Patient Account Number: [id number] Date of Birth/Sex: Treating RN: [birth year] (63 y.o. Male) Baruch Gouty Primary Care Provider: [name] Other Clinician: Referring Provider: Treating Provider/Extender:Chanin Frumkin, Lurena Joiner, [name] Weeks in Treatment: 0 History of Present Illness HPI Description: 63 year old African-American male who is not a very reliable historian who presents with wounds on both his legs, patient had dressing with gauze and a light Coban wrap that was evidently done on July 29 at the outpatient setting and he came in with that which was removed to fair amount of trapped exudate and drainage with slight odor to it. Patient is managed by cardiology for congestive heart failure with EF of 20 to 25%, he has no known history of peripheral arterial disease, no known history of DVTs, he is on Xarelto for A. fib stroke protection, From what we can tell the leg swelling has been present for a long time however weeping of the legs is more recent and the wound formation in both legs  probably of about 3 to 4 weeks duration. Patient's history also includes type 2 diabetes for which he is on insulin, history of alcohol abuse that he states is quit, history of cocaine abuse that he states he has not done for quite some time last known evidence of drug screen positive for cocaine was in September last year. ABI 1.13 Right, 1.27 Left Electronic Signature(s) Signed: 09/02/2018 12:23:05 PM By: Tobi Bastos Previous Signature: 09/02/2018 11:13:41 AM Version By: Tobi Bastos Entered By: Tobi Bastos on 09/02/2018 12:23:04 -------------------------------------------------------------------------------- Physical Exam Details Patient Name: Date of Service: [name], [name] 09/02/2018 9:00 AM Medical Record [id number] Patient Account Number: [id number] Date of Birth/Sex: Treating RN: December 27, 1955 (63 y.o. Male) Baruch Gouty Primary Care Provider: [name] Other Clinician: Referring Provider: Treating Provider/Extender:Jeanelle Dake, Lurena Joiner, [name] Weeks in Treatment: 0 Constitutional alert and oriented x 3. sitting or standing blood pressure is within target range for patient.. supine blood pressure is within target range for patient.. pulse regular and within target range for patient.Marland Kitchen respirations regular, non-labored and within target range for patient.Marland Kitchen temperature within target range for patient.. . . Well-nourished and well-hydrated in no acute distress. Eyes conjunctiva clear no eyelid edema noted. pupils equal round and reactive to light and accommodation. Ears, Nose, Mouth, and Throat no gross abnormality of ear auricles or external auditory canals. normal hearing noted during conversation. mucus membranes moist. Neck supple with no LAD noted in anterior or posterior cervical chain. not enlarged. Respiratory normal breathing without difficulty. clear to auscultation bilaterally. Cardiovascular regular rate and rhythm with normal  S1, S2. no bruits with no significant JVD. 2+ femoral pulses. 2+ dorsalis pedis/posterior tibialis pulses. 4+ pitting edema of the bilateral lower extremities. Gastrointestinal (GI) soft, non-tender, non-distended, +BS. no hepatosplenomegaly. no ventral hernia noted. Neurological cranial nerves 2-12 intact. Patient has normal sensation in the feet bilaterally to light touch. Psychiatric this patient is able to make  decisions and demonstrates good insight into disease process. Alert and Oriented x 3. pleasant and cooperative. Notes Both legs are significantly edematous with pitting edema, there is also lots of stasis changes including scaling of the skin, on the right side patient has right leg wounds lateral lower leg, with a cluster of wounds about for a number the most superior of these wounds has hyper granular base and has slightly different appearance The left leg has 3 anterior tibial wounds that may have started out with blisters that burst, there is definite hyperpigmentation in both legs indicate of of hemosiderin deposition Electronic Signature(s) Signed: 09/02/2018 11:16:06 AM By: Tobi Bastos Entered By: Tobi Bastos on 09/02/2018 11:16:05 -------------------------------------------------------------------------------- Physician Orders Details Patient Name: Date of Service: [name] [name] 09/02/2018 9:00 AM Medical Record [id number] Patient Account Number: [id number] Date of Birth/Sex: Treating RN: 1955/03/13 (63 y.o. Male) Baruch Gouty Primary Care Provider: [name] Other Clinician: Referring Provider: Treating Provider/Extender:Ernestine Langworthy, Lurena Joiner, [name] Weeks in Treatment: 0 Verbal / Phone Orders: No Diagnosis Coding Follow-up Appointments Return Appointment in 1 week. Dressing Change Frequency Other: - 2 times per week, once by home health, Friday at wound center Skin Barriers/Peri-Wound Care Moisturizing lotion - to legs  under wraps Wound Cleansing Clean wound with Normal Saline. - all wounds May shower with protection. Primary Wound Dressing Wound #1 Right,Medial Lower Leg Calcium Alginate with Silver Wound #2 Right,Lateral Lower Leg Calcium Alginate with Silver Wound #3 Left,Anterior Lower Leg Calcium Alginate with Silver Wound #4 Left,Distal,Anterior Lower Leg Calcium Alginate with Silver Secondary Dressing Wound #1 Right,Medial Lower Leg Dry Gauze ABD pad Wound #2 Right,Lateral Lower Leg Dry Gauze ABD pad Wound #3 Left,Anterior Lower Leg Dry Gauze ABD pad Wound #4 Left,Distal,Anterior Lower Leg Dry Gauze ABD pad Edema Control 3 Layer Compression System - Bilateral Avoid standing for long periods of time Elevate legs to the level of the heart or above for 30 minutes daily and/or when sitting, a frequency of: - throughout the day Exercise regularly [place] [facility] - for weekly dressing changes Electronic Signature(s) Signed: 09/02/2018 12:57:56 PM By: Tobi Bastos Signed: 09/02/2018 2:46:48 PM By: Baruch Gouty RN, BSN Entered By: Baruch Gouty on 09/02/2018 11:09:24 -------------------------------------------------------------------------------- Problem List Details Patient Name: Date of Service: [name] [name] 09/02/2018 9:00 AM Medical Record [id number] Patient Account Number: [id number] Date of Birth/Sex: Treating RN: July 14, 1955 (63 y.o. 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 09/02/2018 No Yes breakdown of skin I87.2 Venous  insufficiency (chronic) (peripheral) 09/02/2018 No Yes H82.99 Chronic systolic (congestive) heart failure 09/02/2018 No Yes Inactive Problems Resolved Problems Electronic Signature(s) Signed: 09/02/2018 11:09:59 AM By: Tobi Bastos Entered By: Tobi Bastos on 09/02/2018 11:09:59 -------------------------------------------------------------------------------- Progress Note Details Patient Name: Date of Service: [name] [name] 09/02/2018 9:00 AM Medical Record [id number] Patient Account Number: [id number] Date of Birth/Sex: Treating RN: 12/27/1955 (63 y.o. Male) Baruch Gouty Primary Care Provider: [name] Other Clinician: Referring Provider: Treating Provider/Extender:Cason Dabney, Lurena Joiner, [name] Weeks in Treatment: 0 Subjective Chief Complaint Information obtained from Patient Bilateral leg wounds x 3 weeks History of Present Illness (HPI) 63 year old African-American male who is not a very reliable historian who presents with wounds on both his legs, patient  had dressing with gauze and a light Coban wrap that was evidently done on July 29 at the outpatient setting and he came in with that which was removed to fair amount of trapped exudate and drainage with slight odor to it. Patient is managed by cardiology for congestive heart failure with EF of 20 to 25%, he has no known history of peripheral arterial disease, no known history of DVTs, he is on Xarelto for A. fib stroke protection, From what we can tell the leg swelling has been present for a long time however weeping of the legs is more recent and the wound formation in both legs probably of about 3 to 4 weeks duration. Patient's history also includes type 2 diabetes for which he is on insulin, history of alcohol abuse that he states is quit, history of cocaine abuse that he states he has not done for quite some time last known evidence of drug screen positive for cocaine was in September last  year. ABIs Right 1.13, Left- 1.27 Patient History Information obtained from Patient. Allergies Thorazine Family History Heart Disease - Mother,Father,Siblings, Hypertension - Mother,Father,Siblings,Maternal Grandparents,Paternal Grandparents, No family history of Cancer, Diabetes, Hereditary Spherocytosis, Kidney Disease, Lung Disease, Seizures, Stroke, Thyroid Problems, Tuberculosis. Social History Former smoker, Marital Status - Single, Alcohol Use - Moderate, Drug Use - Prior History, Caffeine Use - Daily. Medical History Eyes Denies history of Cataracts, Glaucoma, Optic Neuritis Hematologic/Lymphatic Denies history of Anemia, Hemophilia, Human Immunodeficiency Virus, Lymphedema, Sickle Cell Disease Respiratory Denies history of Aspiration, Asthma, Chronic Obstructive Pulmonary Disease (COPD), Pneumothorax, Sleep Apnea, Tuberculosis Cardiovascular Patient has history of Congestive Heart Failure, Hypertension Denies history of Angina, Arrhythmia, Coronary Artery Disease, Deep Vein Thrombosis, Hypotension, Myocardial Infarction, Peripheral Arterial Disease, Peripheral Venous Disease, Phlebitis, Vasculitis Gastrointestinal Patient has history of Hepatitis C Denies history of Cirrhosis , Colitis, Crohnoos, Hepatitis A, Hepatitis B Endocrine Patient has history of Type II Diabetes Denies history of Type I Diabetes Genitourinary Patient has history of End Stage Renal Disease Immunological Denies history of Lupus Erythematosus, Raynaudoos, Scleroderma Integumentary (Skin) Denies history of History of Burn Musculoskeletal Denies history of Gout, Rheumatoid Arthritis, Osteoarthritis, Osteomyelitis Neurologic Denies history of Dementia, Neuropathy, Quadriplegia, Paraplegia, Seizure Disorder Oncologic Denies history of Received Chemotherapy, Received Radiation Psychiatric Denies history of Anorexia/bulimia, Confinement Anxiety Patient is treated with Insulin, Oral Agents. Blood  sugar is tested. Blood sugar results noted at the following times: Breakfast - 130 to 160, Lunch - 130 to 160, Dinner - 130 to 160. Review of Systems (ROS) Constitutional Symptoms (General Health) Denies complaints or symptoms of Fatigue, Fever, Chills, Marked Weight Change. Eyes Denies complaints or symptoms of Dry Eyes, Vision Changes, Glasses / Contacts. Ear/Nose/Mouth/Throat Denies complaints or symptoms of Chronic sinus problems or rhinitis. Respiratory Denies complaints or symptoms of Chronic or frequent coughs, Shortness of Breath. Cardiovascular Denies complaints or symptoms of Chest pain. Gastrointestinal Denies complaints or symptoms of Frequent diarrhea, Nausea, Vomiting. Endocrine Denies complaints or symptoms of Heat/cold intolerance. Genitourinary Denies complaints or symptoms of Frequent urination. Integumentary (Skin) Complains or has symptoms of Wounds. Musculoskeletal Denies complaints or symptoms of Muscle Pain, Muscle Weakness. Neurologic Denies complaints or symptoms of Numbness/parasthesias. Psychiatric Denies complaints or symptoms of Claustrophobia, Suicidal. Objective Constitutional alert and oriented x 3. sitting or standing blood pressure is within target range for patient.. supine blood pressure is within target range for patient.. pulse regular and within target range for patient.Marland Kitchen respirations regular, non-labored and within target range for patient.Marland Kitchen temperature within target range for patient.Marland Kitchen  Well-nourished and well-hydrated in no acute distress. Vitals Time Taken: 9:59 AM, Height: 72 in, Source: Stated, Weight: 290 lbs, Source: Stated, BMI: 39.3, Temperature: 98.3 F, Pulse: 96 bpm, Respiratory Rate: 22 breaths/min, Blood Pressure: 126/90 mmHg, Capillary Blood Glucose: 130 mg/dl. General Notes: CBG per patient Eyes conjunctiva clear no eyelid edema noted. pupils equal round and reactive to light and accommodation. Ears, Nose, Mouth, and  Throat no gross abnormality of ear auricles or external auditory canals. normal hearing noted during conversation. mucus membranes moist. Neck supple with no LAD noted in anterior or posterior cervical chain. not enlarged. Respiratory normal breathing without difficulty. clear to auscultation bilaterally. Cardiovascular regular rate and rhythm with normal S1, S2. no bruits with no significant JVD. 2+ femoral pulses. 2+ dorsalis pedis/posterior tibialis pulses. 4+ pitting edema of the bilateral lower extremities. Gastrointestinal (GI) soft, non-tender, non-distended, +BS. no hepatosplenomegaly. no ventral hernia noted. Neurological cranial nerves 2-12 intact. Patient has normal sensation in the feet bilaterally to light touch. Psychiatric this patient is able to make decisions and demonstrates good insight into disease process. Alert and Oriented x 3. pleasant and cooperative. General Notes: Both legs are significantly edematous with pitting edema, there is also lots of stasis changes including scaling of the skin, on the right side patient has right leg wounds lateral lower leg, with a cluster of wounds about for a number the most superior of these wounds has hyper granular base and has slightly different appearance The left leg has 3 anterior tibial wounds that may have started out with blisters that burst, there is definite hyperpigmentation in both legs indicate of of hemosiderin deposition Integumentary (Hair, Skin) Wound #1 status is Open. Original cause of wound was Gradually Appeared. The wound is located on the Right,Medial Lower Leg. The wound measures 4cm length x 1.7cm width x 0.1cm depth; 5.341cm^2 area and 0.534cm^3 volume. There is Fat Layer (Subcutaneous Tissue) Exposed exposed. There is no tunneling or undermining noted. There is a medium amount of serosanguineous drainage noted. Foul odor after cleansing was noted. The wound margin is flat and intact. There is medium  (34-66%) pink granulation within the wound bed. There is a medium (34-66%) amount of necrotic tissue within the wound bed including Adherent Slough. Wound #2 status is Open. Original cause of wound was Gradually Appeared. The wound is located on the Right,Lateral Lower Leg. The wound measures 7cm length x 7cm width x 0.1cm depth; 38.485cm^2 area and 3.848cm^3 volume. There is Fat Layer (Subcutaneous Tissue) Exposed exposed. There is no tunneling or undermining noted. There is a medium amount of serosanguineous drainage noted. Foul odor after cleansing was noted. The wound margin is flat and intact. There is medium (34-66%) pink, pale granulation within the wound bed. There is a medium (34-66%) amount of necrotic tissue within the wound bed including Adherent Slough. Wound #3 status is Open. Original cause of wound was Gradually Appeared. The wound is located on the Left,Anterior Lower Leg. The wound measures 3cm length x 2.7cm width x 0.1cm depth; 6.362cm^2 area and 0.636cm^3 volume. There is Fat Layer (Subcutaneous Tissue) Exposed exposed. There is no tunneling or undermining noted. There is a medium amount of serosanguineous drainage noted. Foul odor after cleansing was noted. The wound margin is flat and intact. There is medium (34-66%) red granulation within the wound bed. There is a small (1-33%) amount of necrotic tissue within the wound bed including Adherent Slough. Wound #4 status is Open. Original cause of wound was Gradually Appeared. The wound is  located on the [facility] Lower Leg. The wound measures 7.3cm length x 3cm width x 0.1cm depth; 17.2cm^2 area and 1.72cm^3 volume. There is Fat Layer (Subcutaneous Tissue) Exposed exposed. There is no tunneling or undermining noted. There is a medium amount of serosanguineous drainage noted. Foul odor after cleansing was noted. The wound margin is flat and intact. There is medium (34-66%) red granulation within the wound bed.  There is a medium (34-66%) amount of necrotic tissue within the wound bed including Adherent Slough. 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 There was a Three Layer Compression Therapy Procedure by Deon Pilling, RN. Post procedure Diagnosis Wound #1: Same as Pre-Procedure Wound #3 Pre-procedure diagnosis of Wound #3 is a Diabetic Wound/Ulcer of the Lower Extremity located on the Left,Anterior Lower Leg . There was a Three Layer Compression Therapy Procedure by Deon Pilling, RN. Post procedure Diagnosis Wound #3: Same as Pre-Procedure Plan Follow-up Appointments: Return Appointment in 1 week. Dressing Change Frequency: Other: - 2 times per week, once by home health, Friday at wound center Skin Barriers/Peri-Wound Care: Moisturizing lotion - to legs under wraps Wound Cleansing: Clean wound with Normal Saline. - all wounds May shower with protection. Primary Wound Dressing: Wound #1 Right,Medial Lower Leg: Calcium Alginate with Silver Wound #2 Right,Lateral Lower Leg: Calcium Alginate with Silver Wound #3 Left,Anterior Lower Leg: Calcium Alginate with Silver Wound #4 Left,Distal,Anterior Lower Leg: Calcium Alginate with Silver Secondary Dressing: Wound #1 Right,Medial Lower Leg: Dry Gauze ABD pad Wound #2 Right,Lateral Lower Leg: Dry Gauze ABD pad Wound #3 Left,Anterior Lower Leg: Dry Gauze ABD pad Wound #4 Left,Distal,Anterior Lower Leg: Dry Gauze ABD pad Edema Control: 3 Layer Compression System - Bilateral Avoid standing for long periods of time Elevate legs to the level of the heart or above for 30  minutes daily and/or when sitting, a frequency of: - throughout the day Exercise regularly Home Health: Admit to [facility] - for weekly dressing changes 1. We are dealing with primarily venous insufficiency and congestive heart failure pathology with leg edema, patient will be started on 3 layer compression wraps his ABIs are consistent with good peripheral flow 2. We will use silver alginate as primary dressing to the wounds 3. The right lateral calf wound that is hyper granular may need to be biopsied if this has a different look 4. Return to clinic next week 5. Patient has been advised to keep his legs up and elevated, he is mostly nonambulatory but tends to sit or sleep in a recliner Electronic Signature(s) Signed: 09/02/2018 12:23:44 PM By: Tobi Bastos Previous Signature: 09/02/2018 11:17:33 AM Version By: Tobi Bastos Entered By: Tobi Bastos on 09/02/2018 12:23:44 -------------------------------------------------------------------------------- HxROS Details Patient Name: Date of Service: [name] [name] 09/02/2018 9:00 AM Medical Record [id number] Patient Account Number: [id number] Date of Birth/Sex: Treating RN: 25-Apr-1955 (63 y.o. Male) Carlene Coria Primary Care Provider: [name] Other Clinician: Referring Provider: Treating Provider/Extender:Jalasia Eskridge, Lurena Joiner, [name] Weeks in Treatment: 0 Information Obtained From Patient Constitutional Symptoms (General Health) Complaints and Symptoms: Negative for: Fatigue; Fever; Chills; Marked Weight Change Eyes Complaints and Symptoms: Negative for: Dry Eyes; Vision Changes; Glasses / Contacts Medical History: Negative for: Cataracts; Glaucoma; Optic Neuritis  Ear/Nose/Mouth/Throat Complaints and Symptoms: Negative for: Chronic sinus problems or rhinitis Respiratory Complaints and Symptoms: Negative for: Chronic or frequent coughs; Shortness of Breath Medical  History: Negative for: Aspiration; Asthma; Chronic Obstructive Pulmonary Disease (COPD); Pneumothorax; Sleep Apnea; Tuberculosis Cardiovascular Complaints and Symptoms: Negative for: Chest pain Medical History: Positive for: Congestive Heart Failure; Hypertension Negative for: Angina; Arrhythmia; Coronary Artery Disease; Deep Vein Thrombosis; Hypotension; Myocardial Infarction; Peripheral Arterial Disease; Peripheral Venous Disease; Phlebitis; Vasculitis Gastrointestinal Complaints and Symptoms: Negative for: Frequent diarrhea; Nausea; Vomiting Medical History: Positive for: Hepatitis C Negative for: Cirrhosis ; Colitis; Crohns; Hepatitis A; Hepatitis B Endocrine Complaints and Symptoms: Negative for: Heat/cold intolerance Medical History: Positive for: Type II Diabetes Negative for: Type I Diabetes Time with diabetes: 3 years Treated with: Insulin, Oral agents Blood sugar tested every day: Yes Tested : 3 times per day Blood sugar testing results: Breakfast: 130 to 160; Lunch: 130 to 160; Dinner: 130 to 160 Genitourinary Complaints and Symptoms: Negative for: Frequent urination Medical History: Positive for: End Stage Renal Disease Integumentary (Skin) Complaints and Symptoms: Positive for: Wounds Medical History: Negative for: History of Burn Musculoskeletal Complaints and Symptoms: Negative for: Muscle Pain; Muscle Weakness Medical History: Negative for: Gout; Rheumatoid Arthritis; Osteoarthritis; Osteomyelitis Neurologic Complaints and Symptoms: Negative for: Numbness/parasthesias Medical History: Negative for: Dementia; Neuropathy; Quadriplegia; Paraplegia; Seizure Disorder Psychiatric Complaints and Symptoms: Negative for: Claustrophobia; Suicidal Medical History: Negative for: Anorexia/bulimia; Confinement Anxiety Hematologic/Lymphatic Medical History: Negative for: Anemia; Hemophilia; Human Immunodeficiency Virus; Lymphedema; Sickle Cell  Disease Immunological Medical History: Negative for: Lupus Erythematosus; Raynauds; Scleroderma Oncologic Medical History: Negative for: Received Chemotherapy; Received Radiation Immunizations Pneumococcal Vaccine: Received Pneumococcal Vaccination: No Implantable Devices None Family and Social History Cancer: No; Diabetes: No; Heart Disease: Yes - Mother,Father,Siblings; Hereditary Spherocytosis: No; Hypertension: Yes - Mother,Father,Siblings,Maternal Grandparents,Paternal Grandparents; Kidney Disease: No; Lung Disease: No; Seizures: No; Stroke: No; Thyroid Problems: No; Tuberculosis: No; Former smoker; Marital Status - Single; Alcohol Use: Moderate; Drug Use: Prior History; Caffeine Use: Daily; Financial Concerns: No; Food, Clothing or Shelter Needs: No; Support System Lacking: No; Transportation Concerns: No Electronic Signature(s) Signed: 09/02/2018 12:57:56 PM By: Tobi Bastos Signed: 01/03/2019 3:07:45 PM By: Carlene Coria RN Entered By: Carlene Coria on 09/02/2018 10:05:05 -------------------------------------------------------------------------------- SuperBill Details Patient Name: Date of Service: [name] [name] 09/02/2018 Medical Record [id number] Patient Account Number: [id number] Date of Birth/Sex: Treating RN: 1955-05-27 (63 y.o. 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 Physician Procedures CPT4 Code Description: [id number] - WC PHYS LEVEL 4 - NEW PT ICD-10 Diagnosis Description B93.903 Non-pressure chronic ulcer of right calf limited to breakd Modifier: own of skin Quantity: 1 Electronic Signature(s) Signed: 09/02/2018 11:18:27 AM By: Tobi Bastos Entered By: Tobi Bastos on 09/02/2018 11:18:26

## 2019-01-03 NOTE — Progress Notes (Signed)
[name] [name], [name] [name] ([id number]) Visit Report for 09/02/2018 Allergy List Details Patient Name: Date of Service: [name] [name], [name] [name] 09/02/2018 9:00 AM Medical Record [id number] Patient Account Number: [id number] Date of Birth/Sex: Treating RN: 03-Sep-1955 (63 y.o. Male) Carlene Coria Primary Care Dez Stauffer: [name] Other Clinician: Referring Ora Mcnatt: Treating Reichen Hutzler/Extender:Madduri, Lurena Joiner, [name] Weeks in Treatment: 0 Allergies Active Allergies Thorazine Allergy Notes Electronic Signature(s) Signed: 01/03/2019 3:07:45 PM By: Carlene Coria RN Entered By: Carlene Coria on 09/02/2018 10:00:11 -------------------------------------------------------------------------------- Arrival Information Details Patient Name: Date of Service: [name] [name] 09/02/2018 9:00 AM Medical Record [id number] Patient Account Number: [id number] Date of Birth/Sex: Treating RN: 08/25/1955 (63 y.o. Male) Carlene Coria Primary Care Hensley Treat: [name] Other Clinician: Referring Ebony Rickel: Treating Edwardo Wojnarowski/Extender:Madduri, Lurena Joiner, [name] Weeks in Treatment: 0 Visit Information Patient Arrived: Wheel Chair Arrival Time: 09:57 Accompanied By: self Transfer Assistance: None Patient Identification Verified: Yes Secondary Verification Process Yes Completed: Patient Requires Transmission- No Based Precautions: Patient Has Alerts: Yes Patient Alerts: Patient on Blood Thinner Electronic Signature(s) Signed: 01/03/2019 3:07:45 PM By: Carlene Coria RN Entered By: Carlene Coria on 09/02/2018 09:58:04 -------------------------------------------------------------------------------- Clinic Level of Care Assessment Details Patient Name: Date of Service: [name] [name], [name] [name] 09/02/2018 9:00 AM Medical Record [id number] Patient Account Number: [id number] Date of Birth/Sex: Treating RN: 09-07-1955 (63 y.o. Male) Baruch Gouty Primary Care  Kamiyah Kindel: [name] Other Clinician: Referring Shatonya Passon: Treating Kaoru Rezendes/Extender:Madduri, Lurena Joiner, [name] Weeks in Treatment: 0 Clinic Level of Care Assessment Items TOOL 1 Quantity Score []  - Use when EandM and Procedure is performed on INITIAL visit 0 ASSESSMENTS - Nursing Assessment / Reassessment X - General Physical Exam (combine w/ comprehensive assessment (listed just below) 1 20 when performed on new pt. evals) X - Comprehensive Assessment (HX, ROS, Risk Assessments, Wounds Hx, etc.) 1 25 ASSESSMENTS - Wound and Skin Assessment / Reassessment []  - Dermatologic / Skin Assessment (not related to wound area) 0 ASSESSMENTS - Ostomy and/or Continence Assessment and Care []  - Incontinence Assessment and Management 0 []  - Ostomy Care Assessment and Management (repouching, etc.) 0 PROCESS - Coordination of Care X - Simple Patient / Family Education for ongoing care 1 15 []  - Complex (extensive) Patient / Family Education for ongoing care 0 X - Staff obtains Programmer, systems, Records, Test Results / Process Orders 1 10 X - Staff telephones HHA, Nursing Homes / Clarify orders / etc 1 10 []  - Routine Transfer to another Facility (non-emergent condition) 0 []  - Routine Hospital Admission (non-emergent condition) 0 X - New Admissions / Biomedical engineer / Ordering NPWT, Apligraf, etc. 1 15 []  - Emergency Hospital Admission (emergent condition) 0 PROCESS - Special Needs []  - Pediatric / Minor Patient Management 0 []  - Isolation Patient Management 0 []  - Hearing / Language / Visual special needs 0 []  - Assessment of Community assistance (transportation, D/C planning, etc.) 0 []  - Additional assistance / Altered mentation 0 []  - Support Surface(s) Assessment (bed, cushion, seat, etc.) 0 INTERVENTIONS - Miscellaneous []  - External ear exam 0 []  - Patient Transfer (multiple staff / Civil Service fast streamer / Similar devices) 0 []  - Simple Staple / Suture removal (25 or less)  0 []  - Complex Staple / Suture removal (26 or more) 0 []  - Hypo/Hyperglycemic Management (do not check if billed separately) 0 X - Ankle / Brachial Index (ABI) - do not check if billed separately 1 15 Has the patient been seen at the hospital within the last three years: Yes Total Score: 110 Level Of Care: New/Established - Level 3  Electronic Signature(s) Signed: 09/02/2018 2:46:48 PM By: Baruch Gouty RN, BSN Entered By: Baruch Gouty on 09/02/2018 14:17:34 -------------------------------------------------------------------------------- Compression Therapy Details Patient Name: Date of Service: [name] [name] 09/02/2018 9:00 AM Medical Record [id number] Patient Account Number: [id number] Date of Birth/Sex: [birth year] (63 y.o. Male) Treating RN: Baruch Gouty Primary Care Keimora Swartout: [name] Other Clinician: Referring Dakoda Bassette: Treating Tamula Morrical/Extender:Madduri, Lurena Joiner, [name] Weeks in Treatment: 0 Compression Therapy Performed for Wound Wound #1 Right,Medial Lower Leg Assessment: Performed By: Clinician Deon Pilling, RN Compression Type: Three Layer Post Procedure Diagnosis Same as Pre-procedure Electronic Signature(s) Signed: 09/02/2018 2:46:48 PM By: Baruch Gouty RN, BSN Entered By: Baruch Gouty on 09/02/2018 11:10:47 -------------------------------------------------------------------------------- Compression Therapy Details Patient Name: Date of Service: [name] [name] 09/02/2018 9:00 AM Medical Record [id number] Patient Account Number: [id number] Date of Birth/Sex: 1955/07/04 (63 y.o. Male) Treating RN: Baruch Gouty Primary Care Takeira Yanes: [name] Other Clinician: Referring Kieryn Burtis: Treating Shamiyah Ngu/Extender:Madduri, Lurena Joiner, [name] Weeks in Treatment: 0 Compression Therapy Performed for Wound Wound #3 Left,Anterior Lower Leg Assessment: Performed By: Clinician Deon Pilling,  RN Compression Type: Three Layer Post Procedure Diagnosis Same as Pre-procedure Electronic Signature(s) Signed: 09/02/2018 2:46:48 PM By: Baruch Gouty RN, BSN Entered By: Baruch Gouty on 09/02/2018 11:10:47 -------------------------------------------------------------------------------- Encounter Discharge Information Details Patient Name: Date of Service: [name] [name] 09/02/2018 9:00 AM Medical Record [id number] Patient Account Number: [id number] Date of Birth/Sex: Treating RN: 1955/03/07 (63 y.o. Male) Deon Pilling Primary Care Shirleen Mcfaul: [name] Other Clinician: Referring Gaylan Fauver: Treating Mekhai Venuto/Extender:Madduri, Lurena Joiner, [name] Weeks in Treatment: 0 Encounter Discharge Information Items Discharge Condition: Stable Ambulatory Status: Wheelchair Discharge Destination: Home Transportation: Private Auto Accompanied By: self Schedule Follow-up Appointment: Yes Clinical Summary of Care: Electronic Signature(s) Signed: 09/02/2018 1:25:23 PM By: Deon Pilling Entered By: Deon Pilling on 09/02/2018 11:51:12 -------------------------------------------------------------------------------- Lower Extremity Assessment Details Patient Name: Date of Service: [name] [name], [name] [name] 09/02/2018 9:00 AM Medical Record [id number] Patient Account Number: [id number] Date of Birth/Sex: Treating RN: 1955-11-17 (63 y.o. Male) Carlene Coria Primary Care Vilma Will: [name] Other Clinician: Referring Prentiss Hammett: Treating Kaelie Henigan/Extender:Madduri, Lurena Joiner, [name] Weeks in Treatment: 0 Edema Assessment Assessed: [Left: No] [Right: No] E[Left: dema] [Right: :] Calf Left: Right: Point of Measurement: 42 cm From Medial Instep 47 cm 45.1 cm Ankle Left: Right: Point of Measurement: 10 cm From Medial Instep 27 cm 26 cm Vascular Assessment Blood Pressure: Brachial: [Left:126] [Right:126] Ankle: [Left:Dorsalis Pedis: 160  1.27] [Right:Dorsalis Pedis: 142 1.13] Electronic Signature(s) Signed: 01/03/2019 3:07:45 PM By: Carlene Coria RN Entered By: Carlene Coria on 09/02/2018 10:37:47 -------------------------------------------------------------------------------- [place] Details Patient Name: Date of Service: [name] [name] 09/02/2018 9:00 AM Medical Record [id number] Patient Account Number: [id number] Date of Birth/Sex: Treating RN: 1955-04-17 (63 y.o. Male) Baruch Gouty Primary Care Abdalrahman Clementson: [name] Other Clinician: Referring Xyon Lukasik: Treating Rudy Luhmann/Extender:Madduri, Lurena Joiner, [name] Weeks in Treatment: 0 Active Inactive Venous Leg Ulcer Nursing Diagnoses: Knowledge deficit related to disease process and management Potential for venous Insuffiency (use before diagnosis confirmed) Goals: Patient will maintain optimal edema control Date Initiated: 09/02/2018 Target Resolution Date: 09/30/2018 Goal Status: Active Patient/caregiver will verbalize understanding of disease process and disease management Date Initiated: 09/02/2018 Target Resolution Date: 09/30/2018 Goal Status: Active Interventions: Assess peripheral edema status every visit. Compression as ordered Provide education on venous insufficiency Treatment Activities: Therapeutic compression applied : 09/02/2018 Notes: Wound/Skin Impairment Nursing Diagnoses: Impaired tissue integrity Knowledge deficit related to ulceration/compromised skin integrity Goals: Patient/caregiver will verbalize understanding of skin care regimen Date Initiated: 09/02/2018 Target Resolution Date: 09/30/2018 Goal Status:  Active Ulcer/skin breakdown will have a volume reduction of 30% by week 4 Date Initiated: 09/02/2018 Target Resolution Date: 09/30/2018 Goal Status: Active Interventions: Assess patient/caregiver ability to obtain necessary supplies Assess patient/caregiver ability to perform ulcer/skin care  regimen upon admission and as needed Assess ulceration(s) every visit Provide education on ulcer and skin care Treatment Activities: Skin care regimen initiated : 09/02/2018 Topical wound management initiated : 09/02/2018 Notes: Electronic Signature(s) Signed: 09/02/2018 2:46:48 PM By: Baruch Gouty RN, BSN Entered By: Baruch Gouty on 09/02/2018 14:16:28 -------------------------------------------------------------------------------- Pain Assessment Details Patient Name: Date of Service: [name] [name] 09/02/2018 9:00 AM Medical Record [id number] Patient Account Number: [id number] Date of Birth/Sex: Treating RN: 07-20-1955 (63 y.o. Male) Carlene Coria Primary Care Hayley Horn: [name] Other Clinician: Referring Khloei Spiker: Treating Tonnie Stillman/Extender:Madduri, Lurena Joiner, [name] Weeks in Treatment: 0 Active Problems Location of Pain Severity and Description of Pain Patient Has Paino No Site Locations Pain Management and Medication Current Pain Management: Electronic Signature(s) Signed: 01/03/2019 3:07:45 PM By: Carlene Coria RN Entered By: Carlene Coria on 09/02/2018 10:46:01 -------------------------------------------------------------------------------- Patient/Caregiver Education Details Patient Name: [name] [name] 8/7/2020andnbsp9:00 Date of Service: AM Medical Record [id number] Number: Patient Account Number: [id number] Treating RN: 1955/05/16 (63 y.o. Baruch Gouty Date of Birth/Gender: Male) Other Clinician: Primary Care Physician: [name] Treating MadduriOdelia Gage Referring Physician: Physician/Extender: Ouida Sills in Treatment: 0 Education Assessment Education Provided To: Patient Education Topics Provided Venous: Methods: Explain/Verbal Responses: Reinforcements needed, State content correctly Wound/Skin Impairment: Methods: Explain/Verbal Responses: Reinforcements needed, State content  correctly Electronic Signature(s) Signed: 09/02/2018 2:46:48 PM By: Baruch Gouty RN, BSN Entered By: Baruch Gouty on 09/02/2018 14:16:44 -------------------------------------------------------------------------------- Wound Assessment Details Patient Name: Date of Service: [name] [name] 09/02/2018 9:00 AM Medical Record [id number] Patient Account Number: [id number] Date of Birth/Sex: Treating RN: [birth year] (63 y.o. Male) Carlene Coria Primary Care Dafney Farler: [name] Other Clinician: Referring Pellegrino Kennard: Treating Saloni Lablanc/Extender:Madduri, Lurena Joiner, [name] Weeks in Treatment: 0 Wound Status Wound Number: 1 Primary Diabetic Wound/Ulcer of the Lower Etiology: Extremity Wound Location: Right Lower Leg - Medial Secondary Venous Leg Ulcer Wounding Event: Gradually Appeared Etiology: Date Acquired: 07/27/2018 Wound Open Weeks Of Treatment: 0 Status: Clustered Wound: No Comorbid Congestive Heart Failure, Hypertension, History: Hepatitis C, Type II Diabetes, End Stage Renal Disease Photos Wound Measurements Length: (cm) 4 % Reduction Width: (cm) 1.7 % Reduction Depth: (cm) 0.1 Epitheliali Area: (cm) 5.341 Tunneling: Volume: (cm) 0.534 Underminin Wound Description Classification: Grade 1 Wound Margin: Flat and Intact Exudate Amount: Medium Exudate Type: Serosanguineous Exudate Color: red, brown Wound Bed Granulation Amount: Medium (34-66%) Granulation Quality: Pink Necrotic Amount: Medium (34-66%) Necrotic Quality: Adherent Slough Foul Odor After Cleansing: Yes Due to Product Use: No Slough/Fibrino Yes Exposed Structure Fascia Exposed: No Fat Layer (Subcutaneous Tissue) Exposed: Yes Tendon Exposed: No Muscle Exposed: No Joint Exposed: No Bone Exposed: No in Area: 0% in Volume: 0% zation: None No g: No Electronic Signature(s) Signed: 09/05/2018 3:20:00 PM By: Mikeal Hawthorne EMT/HBOT Signed: 01/03/2019 3:07:45 PM By: Carlene Coria RN Previous Signature: 09/02/2018 2:46:48 PM Version By: Baruch Gouty RN, BSN Entered By: Mikeal Hawthorne on 09/05/2018 11:06:17 -------------------------------------------------------------------------------- Wound Assessment Details Patient Name: Date of Service: [name] [name] 09/02/2018 9:00 AM Medical Record [id number] Patient Account Number: [id number] Date of Birth/Sex: Treating RN: 01/06/1956 (63 y.o. Male) Carlene Coria Primary Care Deserea Bordley: [name] Other Clinician: Referring Nemiah Kissner: Treating Ronne Stefanski/Extender:Madduri, Lurena Joiner, [name] Weeks in Treatment: 0 Wound Status Wound Number: 2 Primary Diabetic Wound/Ulcer of the Lower Etiology: Extremity Wound Location: Right Lower  Leg - Lateral Secondary Venous Leg Ulcer Wounding Event: Gradually Appeared Etiology: Date Acquired: 07/27/2018 Wound Open Weeks Of Treatment: 0 Status: Clustered Wound: No Comorbid Congestive Heart Failure, Hypertension, History: Hepatitis C, Type II Diabetes, End Stage Renal Disease Photos Wound Measurements Length: (cm) 7 % Reducti Width: (cm) 7 % Reducti Depth: (cm) 0.1 Epithelia Area: (cm) 38.485 Tunnelin Volume: (cm) 3.848 Undermin Wound Description Classification: Grade 1 Wound Margin: Flat and Intact Exudate Amount: Medium Exudate Type: Serosanguineous Exudate Color: red, brown Wound Bed Granulation Amount: Medium (34-66%) Granulation Quality: Pink, Pale Necrotic Amount: Medium (34-66%) Necrotic Quality: Adherent Slough Foul Odor After Cleansing: Yes Due to Product Use: No Slough/Fibrino Yes Exposed Structure Fascia Exposed: No Fat Layer (Subcutaneous Tissue) Exposed: Yes Tendon Exposed: No Muscle Exposed: No Joint Exposed: No Bone Exposed: No on in Area: 0% on in Volume: 0% lization: None g: No ing: No Electronic Signature(s) Signed: 09/05/2018 3:20:00 PM By: Mikeal Hawthorne EMT/HBOT Signed: 01/03/2019 3:07:45 PM By: Carlene Coria RN Previous Signature: 09/02/2018 2:46:48 PM Version By: Baruch Gouty RN, BSN Entered By: Mikeal Hawthorne on 09/05/2018 11:05:40 -------------------------------------------------------------------------------- Wound Assessment Details Patient Name: Date of Service: [name] [name] 09/02/2018 9:00 AM Medical Record [id number] Patient Account Number: [id number] Date of Birth/Sex: Treating RN: Sep 02, 1955 (63 y.o. Male) Carlene Coria Primary Care Alexys Gassett: [name] Other Clinician: Referring Joy Haegele: Treating Bellamy Rubey/Extender:Madduri, Lurena Joiner, [name] Weeks in Treatment: 0 Wound Status Wound Number: 3 Primary Diabetic Wound/Ulcer of the Lower Etiology: Extremity Wound Location: Left Lower Leg - Anterior Secondary Venous Leg Ulcer Wounding Event: Gradually Appeared Wounding Event: Gradually Appeared Etiology: Date Acquired: 07/27/2018 Wound Open Weeks Of Treatment: 0 Status: Clustered Wound: No Comorbid Congestive Heart Failure, Hypertension, History: Hepatitis C, Type II Diabetes, End Stage Renal Disease Photos Wound Measurements Length: (cm) 3 Width: (cm) 2.7 Depth: (cm) 0.1 Area: (cm) 6.362 Volume: (cm) 0.636 Wound Description Classification: Grade 1 Wound Margin: Flat and Intact Exudate Amount: Medium Exudate Type: Serosanguineous Exudate Color: red, brown Wound Bed Granulation Amount: Medium (34-66%) Granulation Quality: Red Necrotic Amount: Small (1-33%) Necrotic Quality: Adherent Slough After Cleansing: Yes oduct Use: No brino Yes Exposed Structure osed: No (Subcutaneous Tissue) Exposed: Yes osed: No osed: No sed: No ed: No % Reduction in Area: 0% % Reduction in Volume: 0% Epithelialization: None Tunneling: No Undermining: No Foul Odor Due to Pr Slough/Fi Fascia Exp Fat Layer Tendon Exp Muscle Exp Joint Expo Bone Expos Electronic Signature(s) Signed: 09/05/2018 3:20:00 PM By: Mikeal Hawthorne  EMT/HBOT Signed: 01/03/2019 3:07:45 PM By: Carlene Coria RN Previous Signature: 09/02/2018 2:46:48 PM Version By: Baruch Gouty RN, BSN Entered By: Mikeal Hawthorne on 09/05/2018 11:15:58 -------------------------------------------------------------------------------- Wound Assessment Details Patient Name: Date of Service: [name] [name] 09/02/2018 9:00 AM Medical Record [id number] Patient Account Number: [id number] Date of Birth/Sex: Treating RN: 04/26/1955 (63 y.o. Male) Carlene Coria Primary Care Deno Sida: [name] Other Clinician: Referring [name]: Treating Rashun Grattan/Extender:Madduri, Lurena Joiner, [name] Weeks in Treatment: 0 Wound Status Wound Number: 4 Primary Diabetic Wound/Ulcer of the Lower Etiology: Extremity Wound Location: Left Lower Leg - Anterior, Distal Secondary Venous Leg Ulcer Wounding Event: Gradually Appeared Etiology: Date Acquired: 07/27/2018 Wound Open Weeks Of Treatment: 0 Status: Clustered Wound: Yes Comorbid Congestive Heart Failure, Hypertension, History: Hepatitis C, Type II Diabetes, End Stage Renal Disease Photos Wound Measurements Length: (cm) 7.3 % Reducti Width: (cm) 3 % Reducti Depth: (cm) 0.1 Epithelia Area: (cm) 17.2 Tunnelin Volume: (cm) 1.72 Undermin Wound Description Classification: Grade 1 Wound Margin: Flat and Intact Exudate Amount: Medium Exudate Type:  Serosanguineous Exudate Color: red, brown Wound Bed Granulation Amount: Medium (34-66%) Granulation Quality: Red Necrotic Amount: Medium (34-66%) Necrotic Quality: Adherent Slough Foul Odor After Cleansing: Yes Due to Product Use: No Slough/Fibrino Yes Exposed Structure Fascia Exposed: No Fat Layer (Subcutaneous Tissue) Exposed: Yes Tendon Exposed: No Muscle Exposed: No Joint Exposed: No Bone Exposed: No on in Area: 0% on in Volume: 0% lization: None g: No ing: No Electronic Signature(s) Signed: 09/05/2018 3:20:00 PM By: Mikeal Hawthorne EMT/HBOT Signed: 01/03/2019 3:07:45 PM By: Carlene Coria RN Previous Signature: 09/02/2018 2:46:48 PM Version By: Baruch Gouty RN, BSN Entered By: Mikeal Hawthorne on 09/05/2018 11:16:37 -------------------------------------------------------------------------------- Vitals Details Patient Name: Date of Service: [name] [name] 09/02/2018 9:00 AM Medical Record [id number] Patient Account Number: [id number] Date of Birth/Sex: Treating RN: [birth year] (63 y.o. Male) Carlene Coria Primary Care Slayde Brault: [name] Other Clinician: Referring Haya Hemler: Treating Jin Capote/Extender:Madduri, Lurena Joiner, [name] Weeks in Treatment: 0 Vital Signs Time Taken: 09:59 Temperature (F): 98.3 Height (in): 72 Pulse (bpm): 96 Source: Stated Respiratory Rate (breaths/min): 22 Weight (lbs): 290 Blood Pressure (mmHg): 126/90 Source: Stated Capillary Blood Glucose (mg/dl): 130 Body Mass Index (BMI): 39.3 Reference Range: 80 - 120 mg / dl Notes CBG per patient Electronic Signature(s) Signed: 01/03/2019 3:07:45 PM By: Carlene Coria RN Entered By: Carlene Coria on 09/02/2018 09:59:52

## 2019-01-03 NOTE — Progress Notes (Signed)
[name] [name], Carriger ([id number]) Visit Report for 09/02/2018 Abuse/Suicide Risk Screen Details Patient Name: Date of Service: [name] [name], [name] [name] 09/02/2018 9:00 AM Medical Record [id number] Patient Account Number: [id number] Date of Birth/Sex: Treating RN: 05/29/1955 (63 y.o. Male) Carlene Coria Primary Care Darrelle Barrell: [name] Other Clinician: Referring Marice Guidone: Treating Darshana Curnutt/Extender:Madduri, Lurena Joiner, [name] Weeks in Treatment: 0 Abuse/Suicide Risk Screen Items Answer ABUSE RISK SCREEN: Has anyone close to you tried to hurt or harm you recentlyo No Do you feel uncomfortable with anyone in your familyo No Has anyone forced you do things that you didnt want to doo No Electronic Signature(s) Signed: 01/03/2019 3:07:45 PM By: Carlene Coria RN Entered By: Carlene Coria on 09/02/2018 10:05:13 -------------------------------------------------------------------------------- Activities of Daily Living Details Patient Name: Date of Service: [name] [name], [name] [name] 09/02/2018 9:00 AM Medical Record [id number] Patient Account Number: [id number] Date of Birth/Sex: Treating RN: 1955-08-03 (63 y.o. Male) Carlene Coria Primary Care Britton Perkinson: [name] Other Clinician: Referring Georgeana Oertel: Treating Forest Redwine/Extender:Madduri, Lurena Joiner, [name] Weeks in Treatment: 0 Activities of Daily Living Items Answer Activities of Daily Living (Please select one for each item) Drive Automobile Not Able Take Medications Completely Able Use Telephone Completely Able Care for Appearance Need Assistance Use Toilet Need Assistance Bath / Shower Need Assistance Dress Self Need Assistance Feed Self Completely Able Walk Need Assistance Get In / Out Bed Need Assistance Housework Not Able Prepare Meals Need Assistance Handle Money Completely Able Shop for Self Not Able Electronic Signature(s) Signed: 01/03/2019 3:07:45 PM By: Carlene Coria RN Entered  By: Carlene Coria on 09/02/2018 10:06:14 -------------------------------------------------------------------------------- Education Screening Details Patient Name: Date of Service: [name] [name] 09/02/2018 9:00 AM Medical Record [id number] Patient Account Number: [id number] Date of Birth/Sex: Treating RN: Jan 24, 1956 (63 y.o. Male) Carlene Coria Primary Care Nyjae Hodge: [name] Other Clinician: Referring Miaa Latterell: Treating Eeva Schlosser/Extender:Madduri, Lurena Joiner, [name] Weeks in Treatment: 0 Learning Preferences/Education Level/Primary Language Learning Preference: Explanation Highest Education Level: High School Preferred Language: English Cognitive Barrier Language Barrier: No Translator Needed: No Memory Deficit: No Emotional Barrier: No Cultural/Religious Beliefs Affecting Medical Care: No Physical Barrier Impaired Vision: No Impaired Hearing: No Decreased Hand dexterity: No Knowledge/Comprehension Knowledge Level: Medium Comprehension Level: Medium Ability to understand written Medium instructions: Ability to understand verbal Medium instructions: Motivation Anxiety Level: Calm Cooperation: Cooperative Education Importance: Acknowledges Need Interest in Health Problems: Uninterested Perception: Coherent Willingness to Engage in Self- Medium Management Activities: Readiness to Engage in Self- Medium Management Activities: Electronic Signature(s) Signed: 01/03/2019 3:07:45 PM By: Carlene Coria RN Entered By: Carlene Coria on 09/02/2018 10:06:51 -------------------------------------------------------------------------------- Fall Risk Assessment Details Patient Name: Date of Service: [name] [name] 09/02/2018 9:00 AM Medical Record [id number] Patient Account Number: [id number] Date of Birth/Sex: Treating RN: 11-09-1955 (63 y.o. Male) Carlene Coria Primary Care Alby Schwabe: [name] Other Clinician: Referring  Karthik Whittinghill: Treating Jamien Casanova/Extender:Madduri, Lurena Joiner, [name] Weeks in Treatment: 0 Fall Risk Assessment Items Have you had 2 or more falls in the last 12 monthso 0 No Have you had any fall that resulted in injury in the last 12 monthso 0 No FALLS RISK SCREEN History of falling - immediate or within 3 months 0 No Secondary diagnosis (Do you have 2 or more medical diagnoseso) 0 No Ambulatory aid None/bed rest/wheelchair/nurse 0 No Crutches/cane/walker 0 No Furniture 0 No Intravenous therapy Access/Saline/Heparin Lock 0 No Weak (short steps with or without shuffle, stooped but able to lift head 0 No while walking, may seek support from furniture) Impaired (short steps with shuffle, may have difficulty arising from chair, 0 No  head down, impaired balance) Mental Status Oriented to own ability 0 No Overestimates or forgets limitations 0 No Risk Level: Low Risk Score: 0 Electronic Signature(s) Signed: 01/03/2019 3:07:45 PM By: Carlene Coria RN Entered By: Carlene Coria on 09/02/2018 10:07:16 -------------------------------------------------------------------------------- Foot Assessment Details Patient Name: Date of Service: [name] [name] 09/02/2018 9:00 AM Medical Record [id number] Patient Account Number: [id number] Date of Birth/Sex: Treating RN: [birth year] (63 y.o. Male) Carlene Coria Primary Care Elder Davidian: [name] Other Clinician: Referring Evalisse Prajapati: Treating Carmencita Cusic/Extender:Madduri, Lurena Joiner, [name] Weeks in Treatment: 0 Foot Assessment Items Site Locations + = Sensation present, - = Sensation absent, C = Callus, U = Ulcer R = Redness, W = Warmth, M = Maceration, PU = Pre-ulcerative lesion F = Fissure, S = Swelling, D = Dryness Assessment Right: Left: Other Deformity: No No Prior Foot Ulcer: No No Prior Amputation: No No Charcot Joint: No No Ambulatory Status: Ambulatory Without Help Gait: Steady Electronic  Signature(s) Signed: 01/03/2019 3:07:45 PM By: Carlene Coria RN Entered By: Carlene Coria on 09/02/2018 10:12:03 -------------------------------------------------------------------------------- Nutrition Risk Screening Details Patient Name: Date of Service: [name] [name], [name] [name] 09/02/2018 9:00 AM Medical Record [id number] Patient Account Number: [id number] Date of Birth/Sex: Treating RN: Apr 29, 1955 (63 y.o. Male) Carlene Coria Primary Care Kerby Hockley: [name] Other Clinician: Referring Tyger Oka: Treating Parminder Cupples/Extender:Madduri, Lurena Joiner, [name] Weeks in Treatment: 0 Height (in): 72 Weight (lbs): 290 Body Mass Index (BMI): 39.3 Nutrition Risk Screening Items Score Screening NUTRITION RISK SCREEN: I have an illness or condition that made me change the kind and/or 0 No amount of food I eat I eat fewer than two meals per day 0 No I eat few fruits and vegetables, or milk products 0 No I have three or more drinks of beer, liquor or wine almost every day 0 No I have tooth or mouth problems that make it hard for me to eat 0 No I don't always have enough money to buy the food I need 0 No I eat alone most of the time 0 No I take three or more different prescribed or over-the-counter drugs a day 1 Yes 0 No Without wanting to, I have lost or gained 10 pounds in the last six months I am not always physically able to shop, cook and/or feed myself 2 Yes Nutrition Protocols Good Risk Protocol Provide education on Moderate Risk Protocol 0 nutrition High Risk Proctocol Risk Level: Moderate Risk Score: 3 Electronic Signature(s) Signed: 01/03/2019 3:07:45 PM By: Carlene Coria RN Entered By: Carlene Coria on 09/02/2018 10:07:36

## 2019-04-12 IMAGING — CT CT CHEST W/O CM
2 of 4 series · 15 of 36 positions shown, 18 images · non-contrast
Comparison: Chest radiograph 02/03/2018

CLINICAL DATA: Flu like symptoms for 4 days. Exertional dyspnea.
Productive cough.

EXAM:
CT CHEST WITHOUT CONTRAST
TECHNIQUE: Multidetector CT imaging of the chest was performed following the
standard protocol without IV contrast.

[Series 2: thorax · axial · 0.88mm/px · z∈[+1335,+1597]mm · 12 of 153 slices shown, 15 images]
[im 11/153  mediastinal]
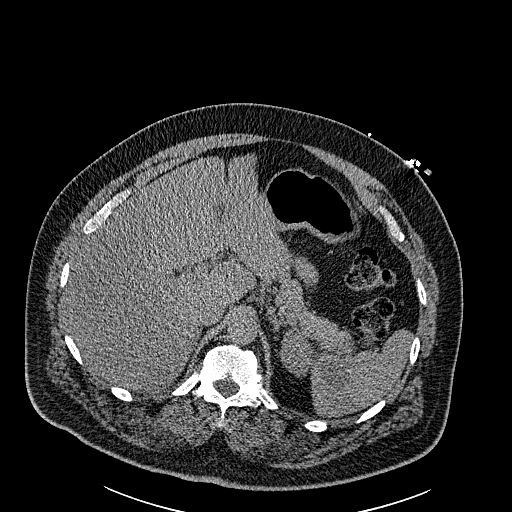
[im 11/153  lung]
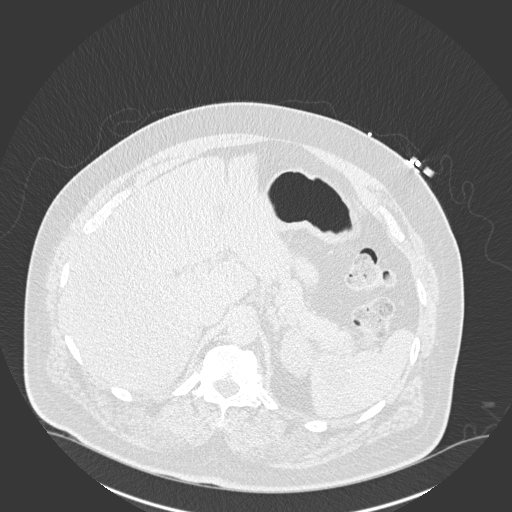
[im 22/153  lung]
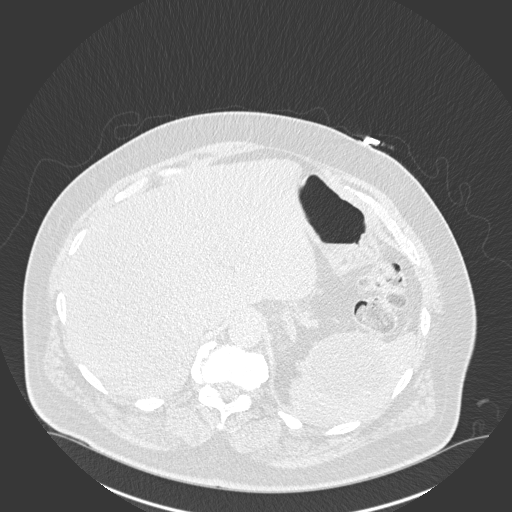
[im 33/153  lung]
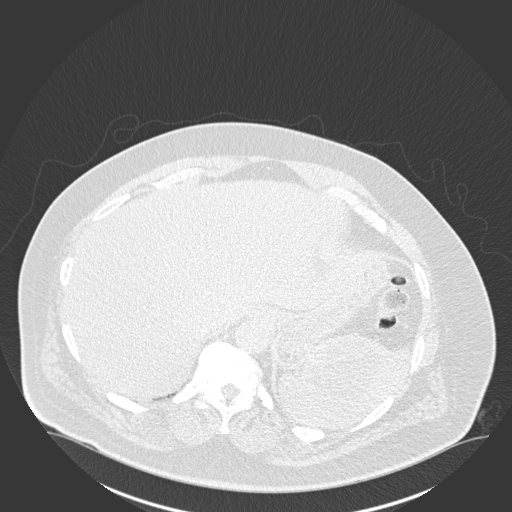
[im 44/153  lung]
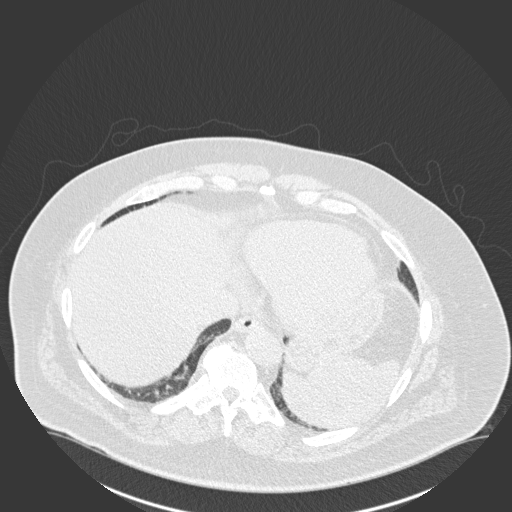
[im 55/153  mediastinal]
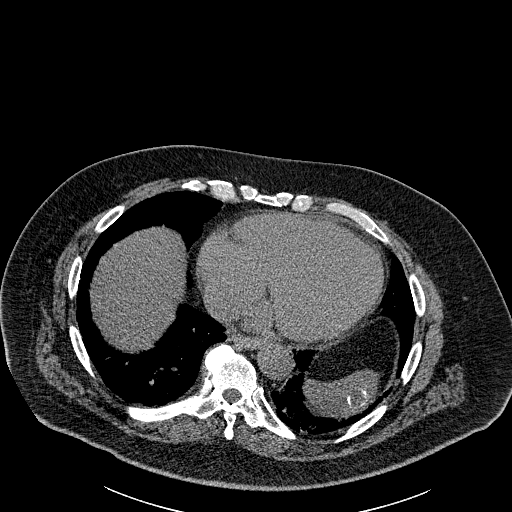
[im 55/153  lung]
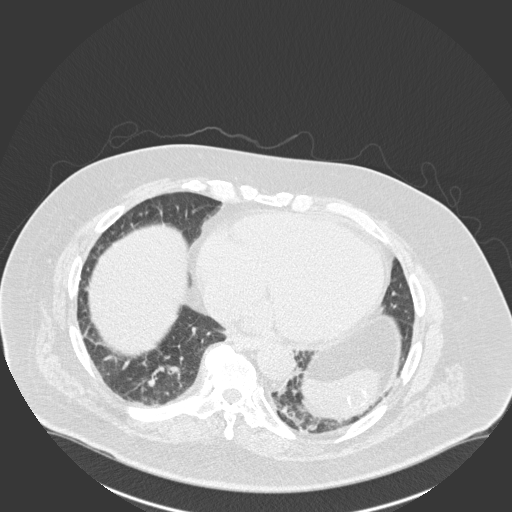
[im 66/153  lung]
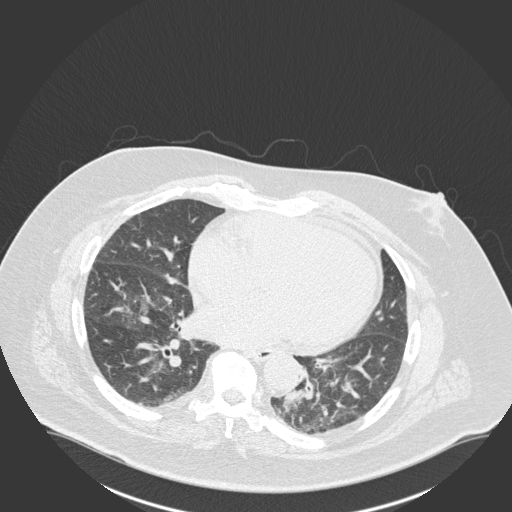
[im 87/153  lung]
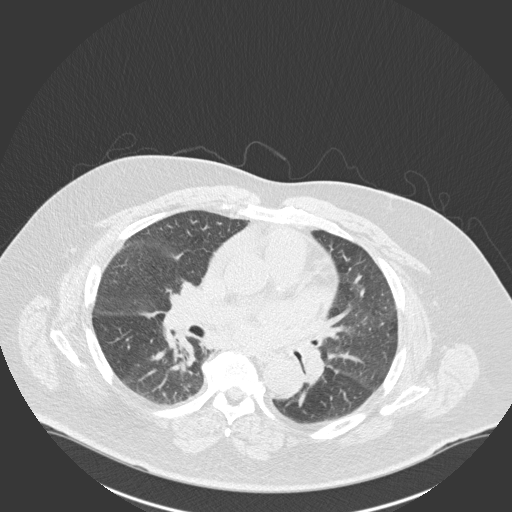
[im 98/153  lung]
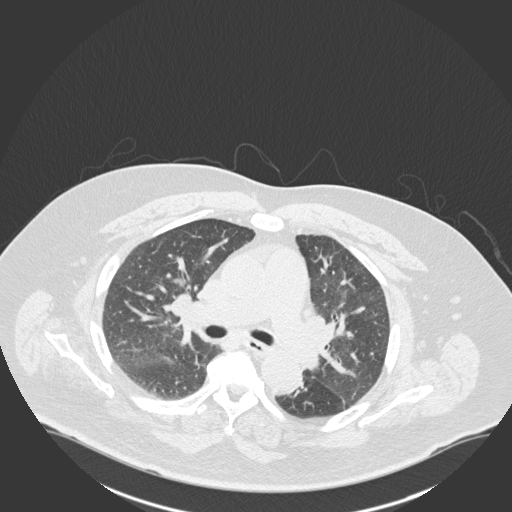
[im 109/153  mediastinal]
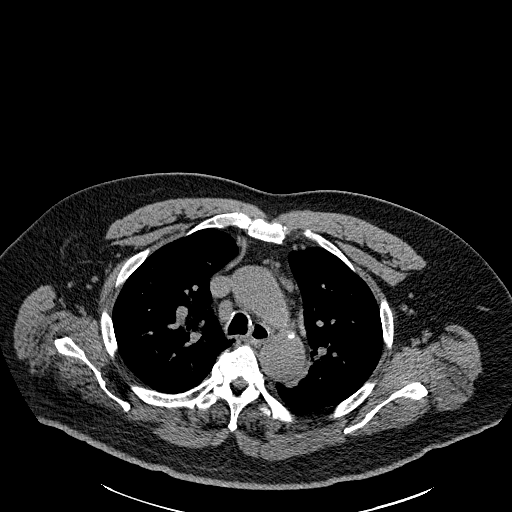
[im 109/153  lung]
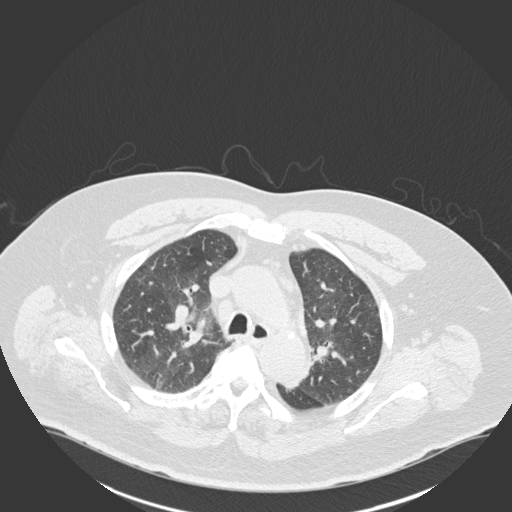
[im 120/153  lung]
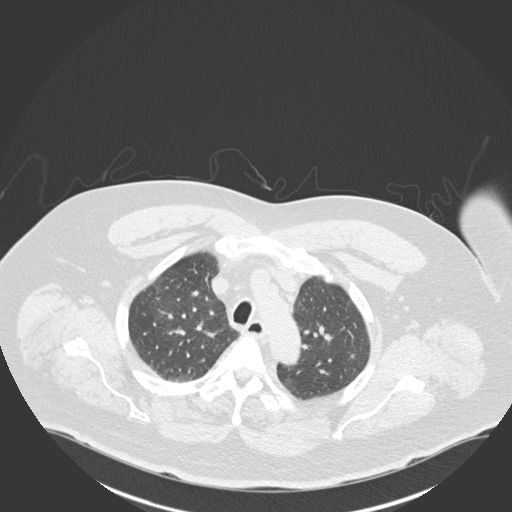
[im 131/153  lung]
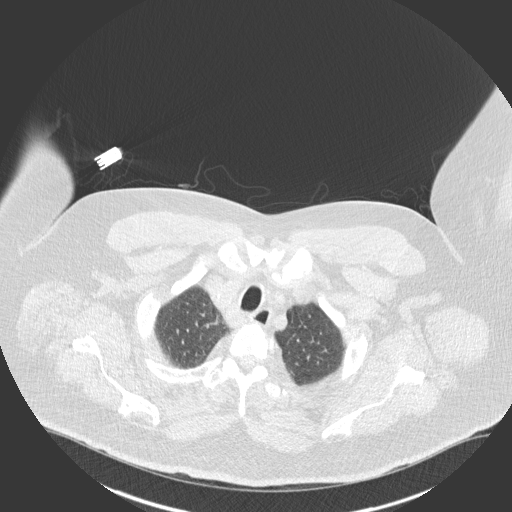
[im 142/153  lung]
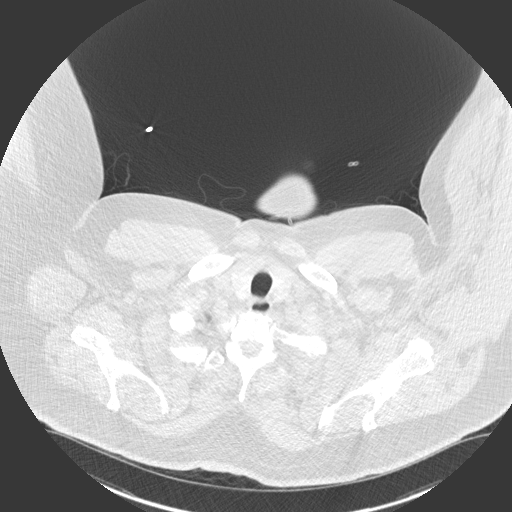

[Series 5: coronal · coronal · 0.68mm/px · 3 of 139 slices shown]
[im 28/139  lung]
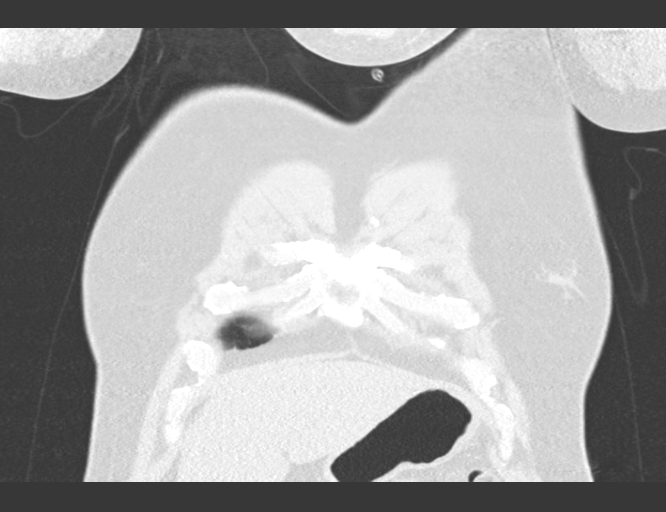
[im 56/139  lung]
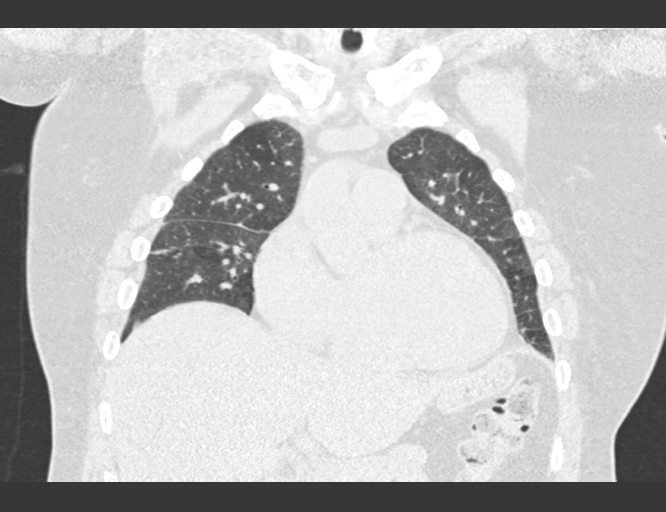
[im 83/139  lung]
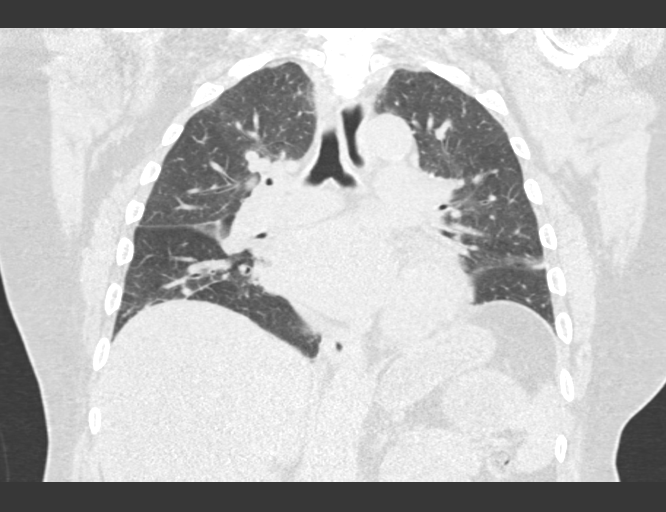

[15 of 36 positions shown; findings below may reference images not displayed]

FINDINGS: Cardiovascular: Enlarged cardiac silhouette. Small pericardial
effusion. Normal contour of the great vessels. Minimal calcific
atherosclerotic disease of the coronary arteries.

Mediastinum/Nodes: Mild right-sided mediastinal lymphadenopathy with
the largest paratracheal lymph node measuring 13 mm in short axis.
Normal appearance of the thyroid gland, esophagus and trachea.

Lungs/Pleura: No evidence of lobar consolidation, pleural effusion
or pneumothorax. Minimal interstitial pulmonary edema. Linear
atelectatic changes in the right middle lobe and lingula.

Upper Abdomen: Numerous cystic lesions and rim calcified lesions
throughout the spleen with benign appearance.

Musculoskeletal: No chest wall mass or suspicious bone lesions
identified. Stigmata of diffuse idiopathic skeletal hyperostosis of
the thoracic spine.
IMPRESSION: No evidence of lobar consolidation.

Minimal interstitial pulmonary edema.

Cardiomegaly with small pericardial effusion.

Mild right-sided mediastinal lymphadenopathy, etiology uncertain.
Follow-up in 6-12 months may be considered.

## 2019-04-12 IMAGING — CR DG CHEST 2V
2 series · 2 of 2 positions shown · non-contrast
Comparison: Chest x-ray 12/27/2017.

CLINICAL DATA: Flu-like symptoms.  Shortness of breath.

EXAM:
CHEST - 2 VIEW

[x chest ap]
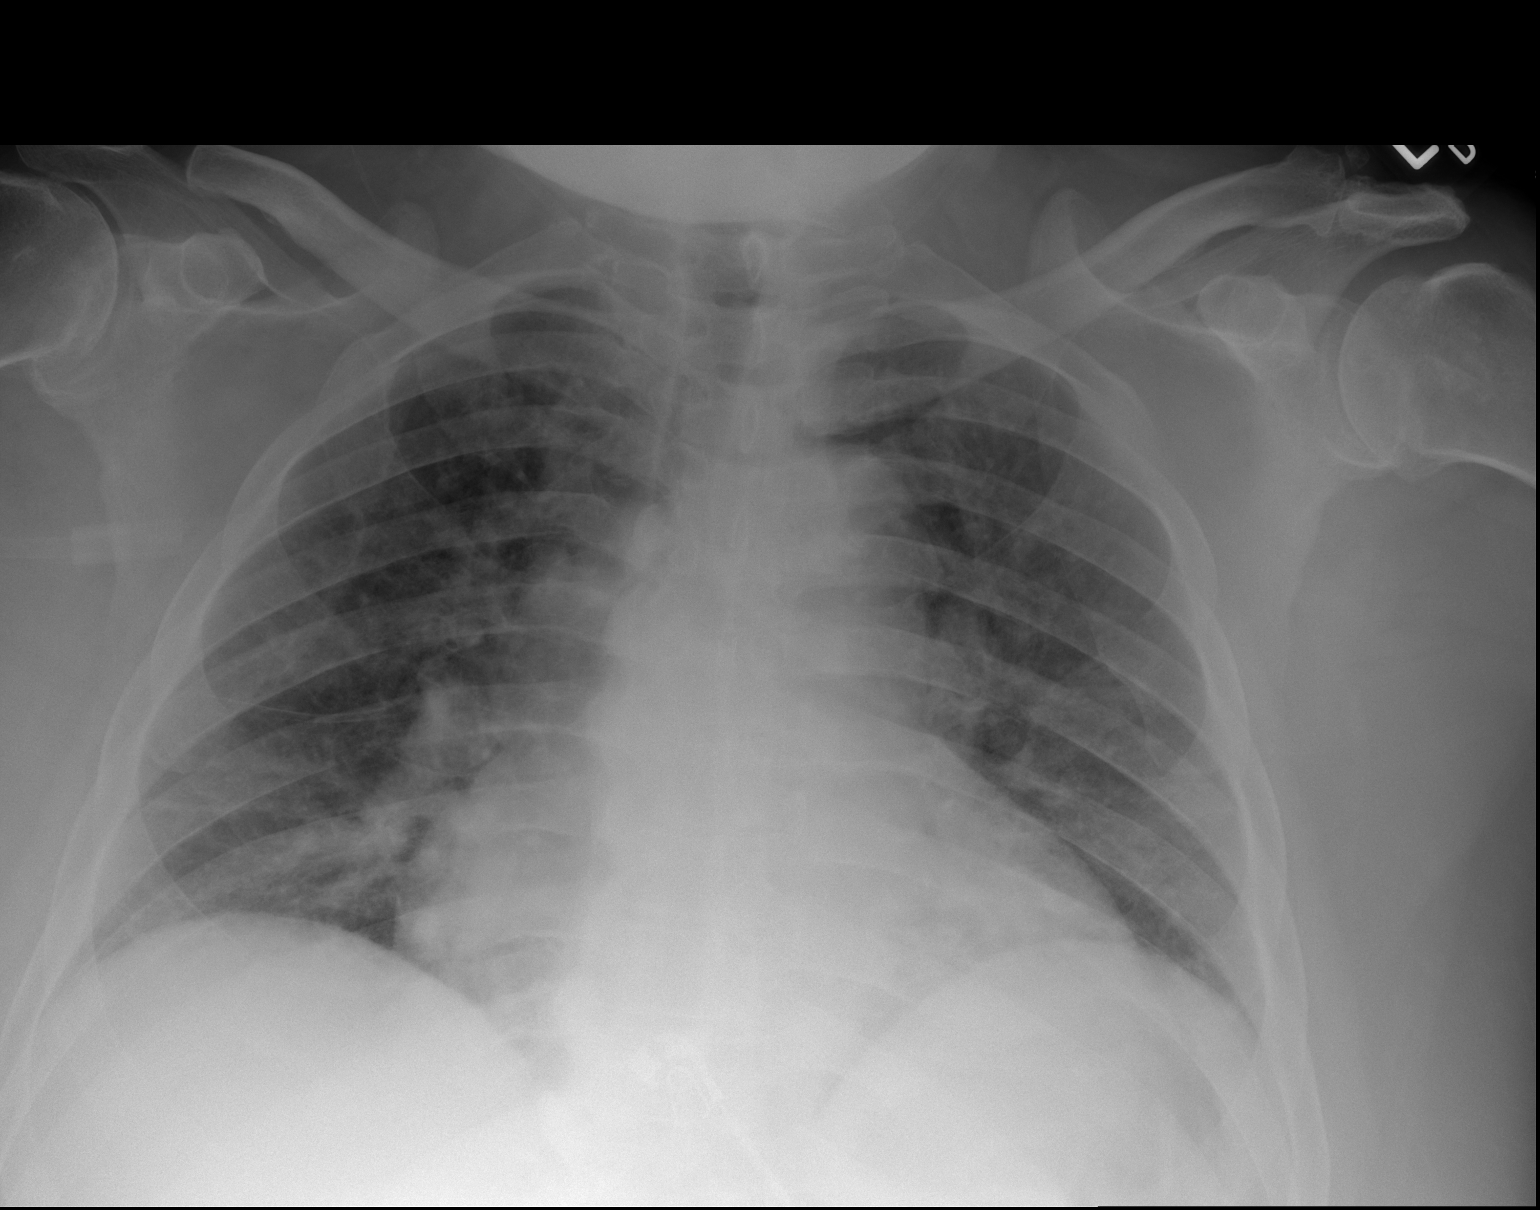

[w chest lat]
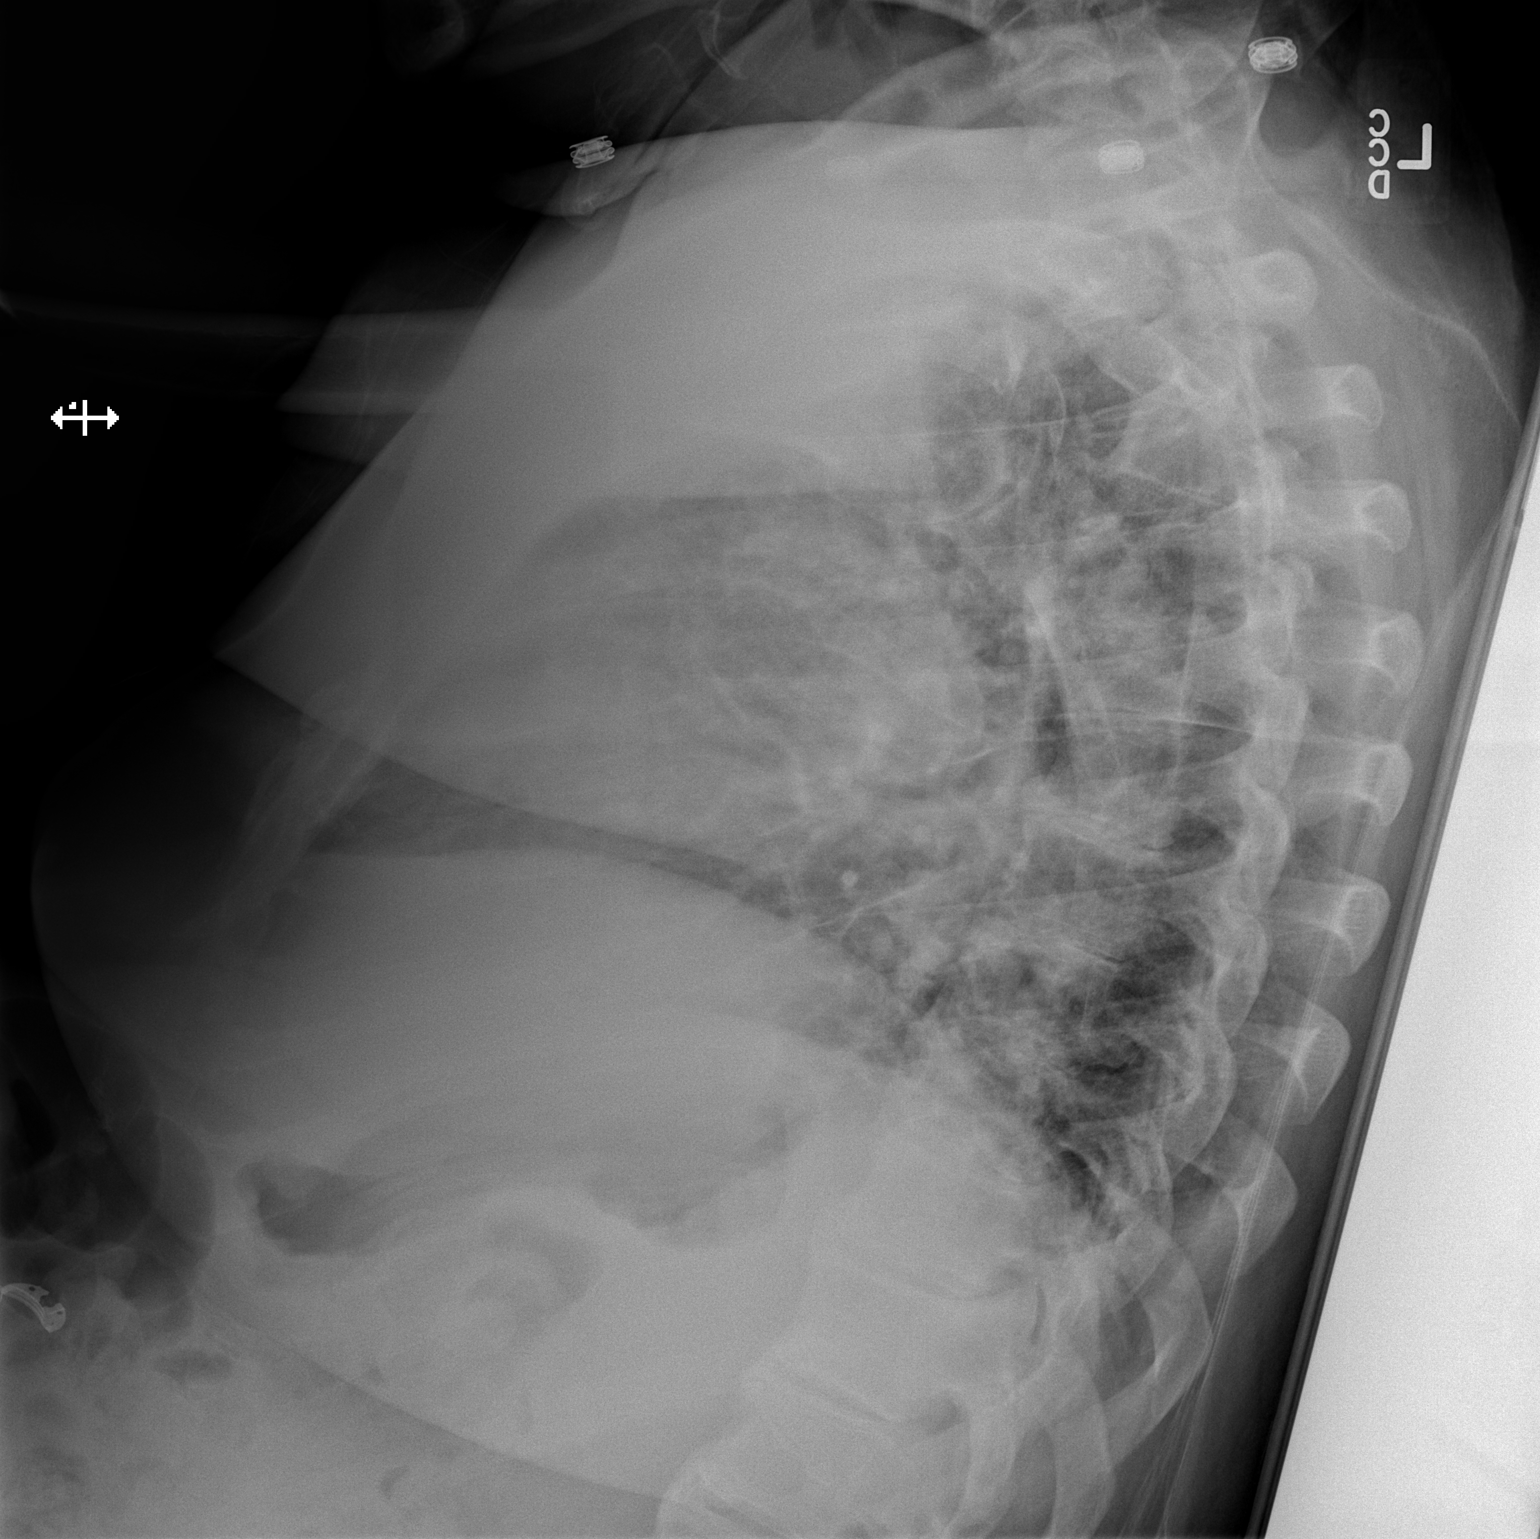

[2 of 2 positions shown; findings below may reference images not displayed]

FINDINGS: Cardiomegaly with diffuse bilateral from interstitial prominence
consistent with CHF. Pneumonitis can not be excluded. Improved
aeration of both lungs noted from 12/27/2017. No pleural effusion or
pneumothorax.
IMPRESSION: Cardiomegaly with diffuse bilateral from interstitial prominence
suggesting CHF. Pneumonitis can not be excluded. Improved aeration
of both lungs noted from 12/27/2017.

## 2019-04-22 IMAGING — DX DG CHEST 1V PORT
1 series · 1 of 1 positions shown · non-contrast
Comparison: February 08, 2018

CLINICAL DATA: Shortness of breath.  Dizziness.

EXAM:
PORTABLE CHEST 1 VIEW

[chest]
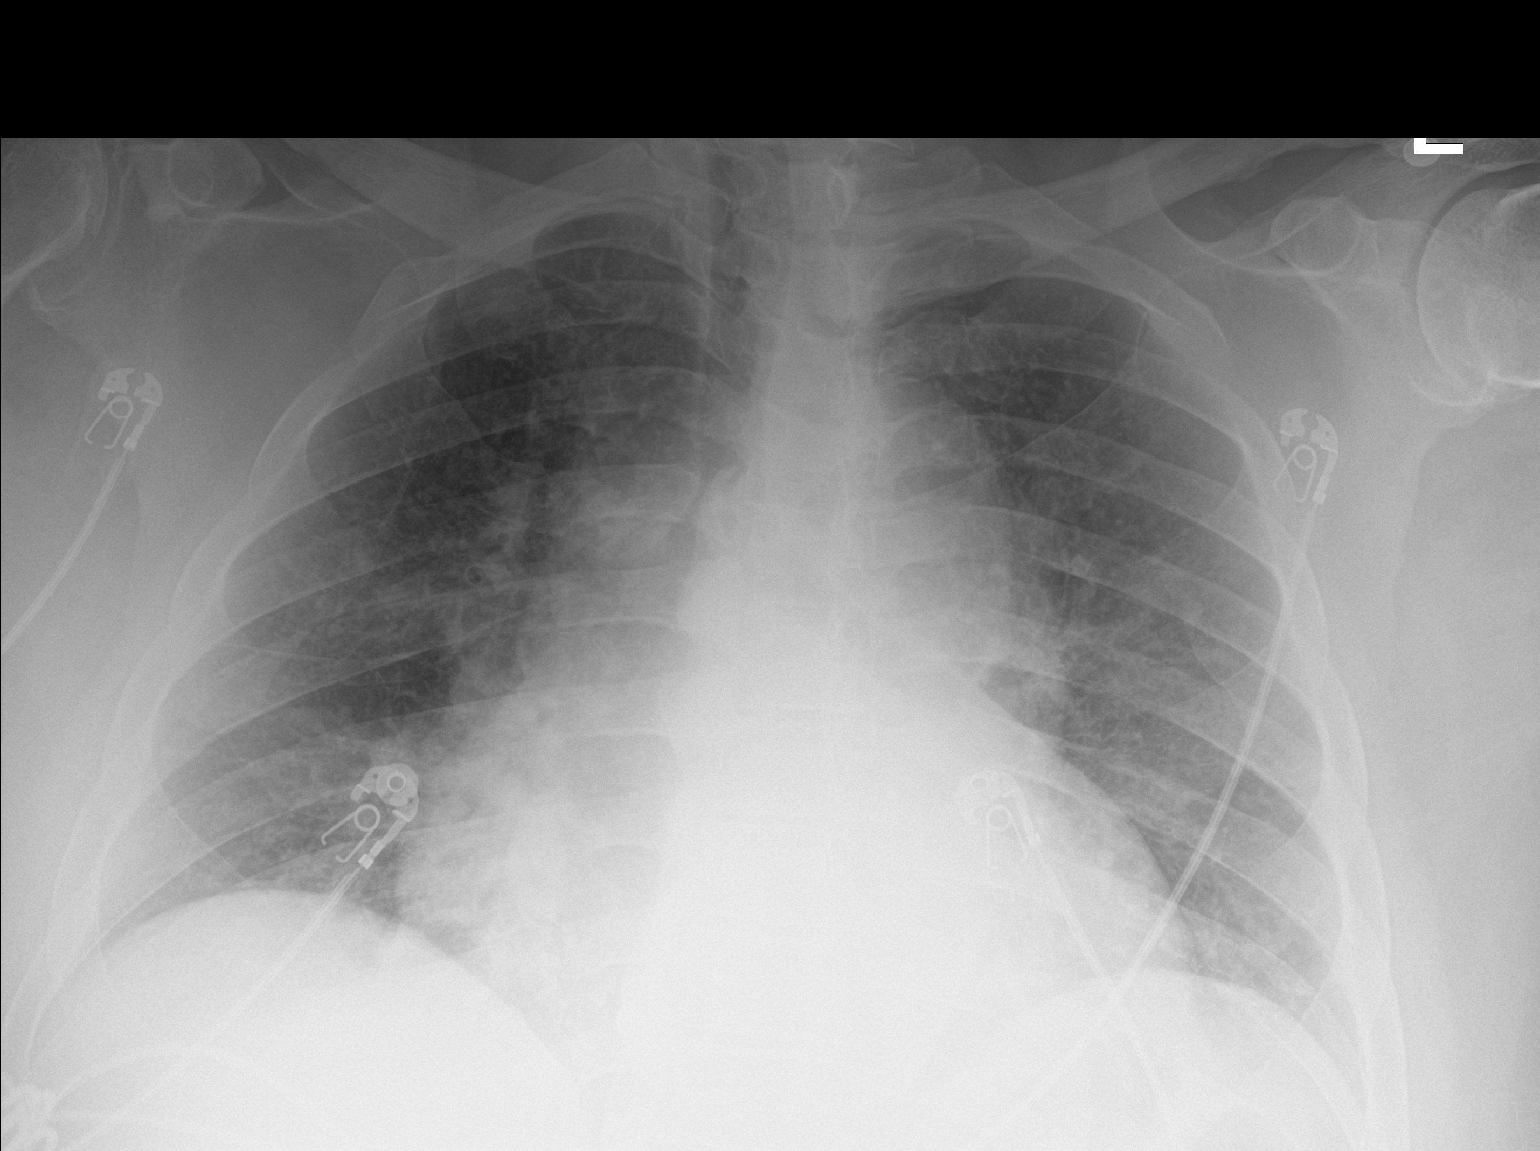

[1 of 1 positions shown; findings below may reference images not displayed]

FINDINGS: Stable cardiomegaly. The mediastinum is unchanged since September 2017 consistent with a tortuous thoracic aorta. No pneumothorax. No
nodules or masses. No focal infiltrates. No overt edema.
IMPRESSION: Limited study due to the low volume portable technique. However, no
acute abnormalities are seen.
